# Patient Record
Sex: Male | Born: 1940 | Race: White | Hispanic: No | Marital: Married | Smoking: Current every day smoker
Health system: Southern US, Community
[De-identification: ages and names within clinical notes are randomized; demographics above are authoritative.]

## PROBLEM LIST (undated history)

## (undated) DIAGNOSIS — F32A Depression, unspecified: Secondary | ICD-10-CM

## (undated) DIAGNOSIS — F419 Anxiety disorder, unspecified: Secondary | ICD-10-CM

## (undated) DIAGNOSIS — K259 Gastric ulcer, unspecified as acute or chronic, without hemorrhage or perforation: Secondary | ICD-10-CM

## (undated) DIAGNOSIS — K635 Polyp of colon: Secondary | ICD-10-CM

## (undated) DIAGNOSIS — M1990 Unspecified osteoarthritis, unspecified site: Secondary | ICD-10-CM

## (undated) DIAGNOSIS — F0390 Unspecified dementia without behavioral disturbance: Secondary | ICD-10-CM

## (undated) DIAGNOSIS — N400 Enlarged prostate without lower urinary tract symptoms: Secondary | ICD-10-CM

## (undated) DIAGNOSIS — K219 Gastro-esophageal reflux disease without esophagitis: Secondary | ICD-10-CM

## (undated) DIAGNOSIS — M792 Neuralgia and neuritis, unspecified: Secondary | ICD-10-CM

## (undated) DIAGNOSIS — E7800 Pure hypercholesterolemia, unspecified: Secondary | ICD-10-CM

## (undated) DIAGNOSIS — G8929 Other chronic pain: Secondary | ICD-10-CM

## (undated) DIAGNOSIS — F1021 Alcohol dependence, in remission: Secondary | ICD-10-CM

## (undated) DIAGNOSIS — G250 Essential tremor: Secondary | ICD-10-CM

## (undated) DIAGNOSIS — Z972 Presence of dental prosthetic device (complete) (partial): Secondary | ICD-10-CM

## (undated) DIAGNOSIS — F329 Major depressive disorder, single episode, unspecified: Secondary | ICD-10-CM

## (undated) DIAGNOSIS — Z8719 Personal history of other diseases of the digestive system: Secondary | ICD-10-CM

## (undated) DIAGNOSIS — M1711 Unilateral primary osteoarthritis, right knee: Secondary | ICD-10-CM

## (undated) DIAGNOSIS — E0590 Thyrotoxicosis, unspecified without thyrotoxic crisis or storm: Secondary | ICD-10-CM

## (undated) DIAGNOSIS — R519 Headache, unspecified: Secondary | ICD-10-CM

## (undated) DIAGNOSIS — E119 Type 2 diabetes mellitus without complications: Secondary | ICD-10-CM

## (undated) DIAGNOSIS — I10 Essential (primary) hypertension: Secondary | ICD-10-CM

## (undated) DIAGNOSIS — T8859XA Other complications of anesthesia, initial encounter: Secondary | ICD-10-CM

## (undated) DIAGNOSIS — M5442 Lumbago with sciatica, left side: Secondary | ICD-10-CM

## (undated) DIAGNOSIS — R51 Headache: Secondary | ICD-10-CM

## (undated) HISTORY — PX: TONSILLECTOMY: SUR1361

## (undated) HISTORY — DX: Headache, unspecified: R51.9

## (undated) HISTORY — DX: Unspecified osteoarthritis, unspecified site: M19.90

## (undated) HISTORY — DX: Alcohol dependence, in remission: F10.21

## (undated) HISTORY — DX: Headache: R51

## (undated) HISTORY — DX: Polyp of colon: K63.5

## (undated) HISTORY — DX: Gastric ulcer, unspecified as acute or chronic, without hemorrhage or perforation: K25.9

## (undated) HISTORY — DX: Thyrotoxicosis, unspecified without thyrotoxic crisis or storm: E05.90

## (undated) HISTORY — PX: OTHER SURGICAL HISTORY: SHX169

## (undated) HISTORY — PX: BACK SURGERY: SHX140

---

## 2005-10-22 ENCOUNTER — Ambulatory Visit: Payer: Self-pay | Admitting: Gastroenterology

## 2005-11-07 ENCOUNTER — Other Ambulatory Visit: Payer: Self-pay

## 2005-11-12 ENCOUNTER — Ambulatory Visit: Payer: Self-pay | Admitting: Unknown Physician Specialty

## 2006-01-27 ENCOUNTER — Emergency Department: Payer: Self-pay | Admitting: Internal Medicine

## 2006-03-13 ENCOUNTER — Ambulatory Visit (HOSPITAL_BASED_OUTPATIENT_CLINIC_OR_DEPARTMENT_OTHER): Admission: RE | Admit: 2006-03-13 | Discharge: 2006-03-13 | Payer: Self-pay | Admitting: Orthopedic Surgery

## 2007-12-24 ENCOUNTER — Encounter: Payer: Self-pay | Admitting: Family Medicine

## 2008-01-20 ENCOUNTER — Encounter: Payer: Self-pay | Admitting: Family Medicine

## 2008-04-15 ENCOUNTER — Emergency Department: Payer: Self-pay | Admitting: Emergency Medicine

## 2009-05-26 ENCOUNTER — Ambulatory Visit: Payer: Self-pay | Admitting: Gastroenterology

## 2010-09-11 ENCOUNTER — Ambulatory Visit: Payer: Self-pay | Admitting: Gastroenterology

## 2010-09-13 LAB — PATHOLOGY REPORT

## 2012-05-02 ENCOUNTER — Ambulatory Visit: Payer: Self-pay | Admitting: Gastroenterology

## 2012-05-05 LAB — PATHOLOGY REPORT

## 2012-08-12 ENCOUNTER — Ambulatory Visit: Payer: Self-pay | Admitting: Neurology

## 2012-09-02 ENCOUNTER — Encounter: Payer: Self-pay | Admitting: Neurology

## 2012-09-18 ENCOUNTER — Encounter: Payer: Self-pay | Admitting: Neurology

## 2012-10-06 DIAGNOSIS — E119 Type 2 diabetes mellitus without complications: Secondary | ICD-10-CM | POA: Insufficient documentation

## 2012-10-06 DIAGNOSIS — N183 Chronic kidney disease, stage 3 unspecified: Secondary | ICD-10-CM | POA: Insufficient documentation

## 2012-10-06 DIAGNOSIS — R251 Tremor, unspecified: Secondary | ICD-10-CM | POA: Insufficient documentation

## 2012-11-03 ENCOUNTER — Encounter: Payer: Self-pay | Admitting: Neurology

## 2012-11-18 ENCOUNTER — Encounter: Payer: Self-pay | Admitting: Neurology

## 2012-12-19 ENCOUNTER — Encounter: Payer: Self-pay | Admitting: Neurology

## 2013-07-13 ENCOUNTER — Encounter: Payer: Self-pay | Admitting: Neurology

## 2013-07-19 ENCOUNTER — Encounter: Payer: Self-pay | Admitting: Neurology

## 2013-07-28 ENCOUNTER — Ambulatory Visit: Payer: Self-pay | Admitting: Orthopedic Surgery

## 2013-08-25 DIAGNOSIS — D126 Benign neoplasm of colon, unspecified: Secondary | ICD-10-CM | POA: Insufficient documentation

## 2013-08-25 DIAGNOSIS — E291 Testicular hypofunction: Secondary | ICD-10-CM | POA: Insufficient documentation

## 2013-11-12 DIAGNOSIS — K529 Noninfective gastroenteritis and colitis, unspecified: Secondary | ICD-10-CM | POA: Insufficient documentation

## 2013-11-18 DIAGNOSIS — Z720 Tobacco use: Secondary | ICD-10-CM | POA: Insufficient documentation

## 2013-11-18 DIAGNOSIS — R200 Anesthesia of skin: Secondary | ICD-10-CM | POA: Insufficient documentation

## 2013-12-01 ENCOUNTER — Ambulatory Visit: Payer: Self-pay | Admitting: Gastroenterology

## 2013-12-02 LAB — PATHOLOGY REPORT

## 2014-01-06 DIAGNOSIS — F329 Major depressive disorder, single episode, unspecified: Secondary | ICD-10-CM | POA: Insufficient documentation

## 2014-01-06 DIAGNOSIS — F32A Depression, unspecified: Secondary | ICD-10-CM | POA: Insufficient documentation

## 2014-03-19 ENCOUNTER — Ambulatory Visit: Payer: Self-pay | Admitting: Orthopedic Surgery

## 2014-04-21 ENCOUNTER — Ambulatory Visit: Payer: Self-pay | Admitting: Orthopedic Surgery

## 2014-05-21 HISTORY — PX: JOINT REPLACEMENT: SHX530

## 2014-11-03 ENCOUNTER — Encounter (HOSPITAL_COMMUNITY): Payer: Self-pay | Admitting: Physician Assistant

## 2014-11-03 DIAGNOSIS — G250 Essential tremor: Secondary | ICD-10-CM | POA: Diagnosis present

## 2014-11-03 DIAGNOSIS — F329 Major depressive disorder, single episode, unspecified: Secondary | ICD-10-CM | POA: Diagnosis present

## 2014-11-03 DIAGNOSIS — I10 Essential (primary) hypertension: Secondary | ICD-10-CM | POA: Diagnosis present

## 2014-11-03 DIAGNOSIS — M1711 Unilateral primary osteoarthritis, right knee: Secondary | ICD-10-CM | POA: Diagnosis present

## 2014-11-03 DIAGNOSIS — F331 Major depressive disorder, recurrent, moderate: Secondary | ICD-10-CM | POA: Diagnosis present

## 2014-11-03 DIAGNOSIS — F419 Anxiety disorder, unspecified: Secondary | ICD-10-CM | POA: Diagnosis present

## 2014-11-03 DIAGNOSIS — F32A Depression, unspecified: Secondary | ICD-10-CM | POA: Diagnosis present

## 2014-11-03 DIAGNOSIS — E119 Type 2 diabetes mellitus without complications: Secondary | ICD-10-CM

## 2014-11-03 NOTE — H&P (Signed)
TOTAL KNEE ADMISSION H&P  Patient is being admitted for right total knee arthroplasty.  Subjective:  Chief Complaint:right knee pain.  HPI: James Hood, 74 y.o. male, has a history of pain and functional disability in the right knee due to arthritis and has failed non-surgical conservative treatments for greater than 12 weeks to includeNSAID's and/or analgesics, corticosteriod injections, viscosupplementation injections, flexibility and strengthening excercises, supervised PT with diminished ADL's post treatment, use of assistive devices and activity modification.  Onset of symptoms was gradual, starting 10 years ago with gradually worsening course since that time. The patient noted no past surgery on the right knee(s).  Patient currently rates pain in the right knee(s) at 10 out of 10 with activity. Patient has night pain, worsening of pain with activity and weight bearing, pain that interferes with activities of daily living, crepitus and joint swelling.  Patient has evidence of subchondral sclerosis, joint subluxation and joint space narrowing by imaging studies.  There is no active infection.  Patient Active Problem List   Diagnosis Date Noted  . Hypertension   . Diabetes mellitus type 2 in nonobese   . Tremor, essential   . Primary localized osteoarthritis of right knee   . Anxiety and depression    Past Medical History  Diagnosis Date  . Hypertension   . Anxiety and depression   . Neuropathic pain   . Diabetes mellitus type 2 in nonobese   . Tremor, essential   . Hypercholesteremia   . Primary localized osteoarthritis of right knee     Past Surgical History  Procedure Laterality Date  . Esophageal stretch      No prescriptions prior to admission   No Known Allergies  History  Substance Use Topics  . Smoking status: Current Every Day Smoker -- 1.00 packs/day for 60 years    Types: Cigarettes  . Smokeless tobacco: Not on file  . 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 Heart attack Mother   . Hypertension Father      Review of Systems  Constitutional: Negative.   HENT: Negative.   Respiratory: Negative.   Cardiovascular: Negative.   Gastrointestinal: Negative.   Genitourinary: Negative.   Musculoskeletal: Positive for back pain and joint pain.  Skin: Negative.   Neurological: Negative.   Endo/Heme/Allergies: Negative.   Psychiatric/Behavioral: Negative.     Objective:  Physical Exam  Constitutional: He is oriented to person, place, and time. He appears well-developed and well-nourished.  HENT:  Head: Normocephalic and atraumatic.  Mouth/Throat: Oropharynx is clear and moist.  Eyes: Conjunctivae are normal. Pupils are equal, round, and reactive to light.  Neck: Neck supple.  Cardiovascular: Normal rate, regular rhythm and intact distal pulses.   Respiratory: Effort normal.  GI: Soft.  Genitourinary:  Not pertinent to current symptomatology therefore not examined.  Musculoskeletal:  Examination of his right knee reveals pain medially and laterally.  Moderate varus deformity.  1+ effusion.  Range of motion 0-120 degrees.  Knee is stable with normal patella tracking.  Examination of the left knee reveals full range of motion without pain, swelling, weakness or instability.  Vascular exam: Pulses are 2+ and symmetric.  Neurological: He is alert and oriented to person, place, and time.  Skin: Skin is warm and dry.  Psychiatric: He has a normal mood and affect. His behavior is normal.    Vital signs in last 24 hours:  Temp:  [98.3 F (36.8 C)] 98.3 F (36.8 C) (06/15 1600) Pulse Rate:  [69] 69 (06/15 1600) BP: (119)/(68) 119/68 mmHg (06/15 1600) SpO2:  [95 %] 95 % (06/15 1600) Weight:  [90.719 kg (200 lb)] 90.719 kg (200 lb) (06/15 1600)  Labs:   Estimated body mass index is 28.7 kg/(m^2) as calculated from the following:   Height as of this encounter: 5\' 10"   (1.778 m).   Weight as of this encounter: 90.719 kg (200 lb).   Imaging Review Plain radiographs demonstrate severe degenerative joint disease of the right knee(s). The overall alignment issignificant varus. The bone quality appears to be good for age and reported activity level.  Assessment/Plan:  End stage arthritis, right knee  Principal Problem:   Primary localized osteoarthritis of right knee Active Problems:   Hypertension   Diabetes mellitus type 2 in nonobese   Tremor, essential   Anxiety and depression   The patient history, physical examination, clinical judgment of the provider and imaging studies are consistent with end stage degenerative joint disease of the right knee(s) and total knee arthroplasty is deemed medically necessary. The treatment options including medical management, injection therapy arthroscopy and arthroplasty were discussed at length. The risks and benefits of total knee arthroplasty were presented and reviewed. The risks due to aseptic loosening, infection, stiffness, patella tracking problems, thromboembolic complications and other imponderables were discussed. The patient acknowledged the explanation, agreed to proceed with the plan and consent was signed. Patient is being admitted for inpatient treatment for surgery, pain control, PT, OT, prophylactic antibiotics, VTE prophylaxis, progressive ambulation and ADL's and discharge planning. The patient is planning to be discharged home with home health services

## 2014-11-05 ENCOUNTER — Encounter (HOSPITAL_COMMUNITY)
Admission: RE | Admit: 2014-11-05 | Discharge: 2014-11-05 | Disposition: A | Discharge status: Home / Self Care | Payer: Commercial Managed Care - HMO | Source: Ambulatory Visit | Attending: Orthopedic Surgery | Admitting: Orthopedic Surgery

## 2014-11-05 ENCOUNTER — Encounter (HOSPITAL_COMMUNITY): Payer: Self-pay

## 2014-11-05 DIAGNOSIS — M179 Osteoarthritis of knee, unspecified: Secondary | ICD-10-CM | POA: Insufficient documentation

## 2014-11-05 DIAGNOSIS — Z01812 Encounter for preprocedural laboratory examination: Secondary | ICD-10-CM | POA: Insufficient documentation

## 2014-11-05 DIAGNOSIS — Z01810 Encounter for preprocedural cardiovascular examination: Secondary | ICD-10-CM | POA: Diagnosis not present

## 2014-11-05 DIAGNOSIS — Z0183 Encounter for blood typing: Secondary | ICD-10-CM | POA: Insufficient documentation

## 2014-11-05 HISTORY — DX: Depression, unspecified: F32.A

## 2014-11-05 HISTORY — DX: Anxiety disorder, unspecified: F41.9

## 2014-11-05 HISTORY — DX: Gastro-esophageal reflux disease without esophagitis: K21.9

## 2014-11-05 HISTORY — DX: Major depressive disorder, single episode, unspecified: F32.9

## 2014-11-05 LAB — APTT: aPTT: 34 seconds (ref 24–37)

## 2014-11-05 LAB — CBC WITH DIFFERENTIAL/PLATELET
Basophils Absolute: 0 10*3/uL (ref 0.0–0.1)
Basophils Relative: 1 % (ref 0–1)
Eosinophils Absolute: 0.2 10*3/uL (ref 0.0–0.7)
Eosinophils Relative: 2 % (ref 0–5)
HCT: 35.5 % — ABNORMAL LOW (ref 39.0–52.0)
Hemoglobin: 11.9 g/dL — ABNORMAL LOW (ref 13.0–17.0)
Lymphocytes Relative: 32 % (ref 12–46)
Lymphs Abs: 2.5 10*3/uL (ref 0.7–4.0)
MCH: 31.2 pg (ref 26.0–34.0)
MCHC: 33.5 g/dL (ref 30.0–36.0)
MCV: 93.2 fL (ref 78.0–100.0)
Monocytes Absolute: 0.7 10*3/uL (ref 0.1–1.0)
Monocytes Relative: 9 % (ref 3–12)
Neutro Abs: 4.4 10*3/uL (ref 1.7–7.7)
Neutrophils Relative %: 56 % (ref 43–77)
Platelets: 305 10*3/uL (ref 150–400)
RBC: 3.81 MIL/uL — ABNORMAL LOW (ref 4.22–5.81)
RDW: 12.6 % (ref 11.5–15.5)
WBC: 7.8 10*3/uL (ref 4.0–10.5)

## 2014-11-05 LAB — COMPREHENSIVE METABOLIC PANEL
ALT: 24 U/L (ref 17–63)
AST: 34 U/L (ref 15–41)
Albumin: 4.1 g/dL (ref 3.5–5.0)
Alkaline Phosphatase: 40 U/L (ref 38–126)
Anion gap: 10 (ref 5–15)
BUN: 22 mg/dL — ABNORMAL HIGH (ref 6–20)
CO2: 28 mmol/L (ref 22–32)
Calcium: 9.8 mg/dL (ref 8.9–10.3)
Chloride: 95 mmol/L — ABNORMAL LOW (ref 101–111)
Creatinine, Ser: 1.71 mg/dL — ABNORMAL HIGH (ref 0.61–1.24)
GFR calc Af Amer: 44 mL/min — ABNORMAL LOW (ref >60)
GFR calc non Af Amer: 38 mL/min — ABNORMAL LOW (ref >60)
Glucose, Bld: 151 mg/dL — ABNORMAL HIGH (ref 65–99)
Potassium: 4.5 mmol/L (ref 3.5–5.1)
Sodium: 133 mmol/L — ABNORMAL LOW (ref 135–145)
Total Bilirubin: 0.4 mg/dL (ref 0.3–1.2)
Total Protein: 6.7 g/dL (ref 6.5–8.1)

## 2014-11-05 LAB — TYPE AND SCREEN
ABO/RH(D): O POS
Antibody Screen: NEGATIVE

## 2014-11-05 LAB — PROTIME-INR
INR: 1.08 (ref 0.00–1.49)
Prothrombin Time: 14.2 seconds (ref 11.6–15.2)

## 2014-11-05 LAB — SURGICAL PCR SCREEN
MRSA, PCR: NEGATIVE
Staphylococcus aureus: NEGATIVE

## 2014-11-05 LAB — ABO/RH: ABO/RH(D): O POS

## 2014-11-05 LAB — GLUCOSE, CAPILLARY: Glucose-Capillary: 168 mg/dL — ABNORMAL HIGH (ref 65–99)

## 2014-11-05 NOTE — Pre-Procedure Instructions (Signed)
    James Hood  11/05/2014      [facility] [place], [place] - [address] [place] [address] Phone: [phone] Fax: [phone]    Your procedure is scheduled on 11/15/14.  Report to [facility]mitting at 845 A.M.  Call this number if you have problems the morning of surgery:  [phone]   Remember:  Do not eat food or drink liquids after midnight.  Take these medicines the morning of surgery with A SIP OF WATER tylenol,zyban,flexeril,depakote,aricept,neurontin,namenda,lopressor,prilosec   Do not wear jewelry, make-up or nail polish.  Do not wear lotions, powders, or perfumes.  You may wear deodorant.  Do not shave 48 hours prior to surgery.  Men may shave face and neck.  Do not bring valuables to the hospital.  [facility] is not responsible for any belongings or valuables.  Contacts, dentures or bridgework may not be worn into surgery.  Leave your suitcase in the car.  After surgery it may be brought to your room.  For patients admitted to the hospital, discharge time will be determined by your treatment team.  Patients discharged the day of surgery will not be allowed to drive home.   Name and phone number of your driver:Special instructions:Please read over the following fact sheets that you were given. Pain Booklet, Coughing and Deep Breathing, Total Joint Packet and MRSA Information

## 2014-11-06 LAB — URINE CULTURE

## 2014-11-06 LAB — HEMOGLOBIN A1C
Hgb A1c MFr Bld: 7.4 % — ABNORMAL HIGH (ref 4.8–5.6)
Mean Plasma Glucose: 166 mg/dL

## 2014-11-15 ENCOUNTER — Inpatient Hospital Stay (HOSPITAL_COMMUNITY): Payer: Commercial Managed Care - HMO | Admitting: Anesthesiology

## 2014-11-15 ENCOUNTER — Encounter (HOSPITAL_COMMUNITY): Payer: Self-pay | Admitting: *Deleted

## 2014-11-15 ENCOUNTER — Inpatient Hospital Stay (HOSPITAL_COMMUNITY)
Admission: RE | Admit: 2014-11-15 | Discharge: 2014-11-17 | DRG: 470 | Disposition: A | Discharge status: Home Health | Payer: Commercial Managed Care - HMO | Source: Ambulatory Visit | Attending: Orthopedic Surgery | Admitting: Orthopedic Surgery

## 2014-11-15 ENCOUNTER — Encounter (HOSPITAL_COMMUNITY)
Admission: RE | Disposition: A | Discharge status: Home Health | Payer: Self-pay | Source: Ambulatory Visit | Attending: Orthopedic Surgery

## 2014-11-15 DIAGNOSIS — F419 Anxiety disorder, unspecified: Secondary | ICD-10-CM | POA: Diagnosis present

## 2014-11-15 DIAGNOSIS — F17210 Nicotine dependence, cigarettes, uncomplicated: Secondary | ICD-10-CM | POA: Diagnosis present

## 2014-11-15 DIAGNOSIS — Z8249 Family history of ischemic heart disease and other diseases of the circulatory system: Secondary | ICD-10-CM

## 2014-11-15 DIAGNOSIS — I10 Essential (primary) hypertension: Secondary | ICD-10-CM | POA: Diagnosis present

## 2014-11-15 DIAGNOSIS — E780 Pure hypercholesterolemia: Secondary | ICD-10-CM | POA: Diagnosis present

## 2014-11-15 DIAGNOSIS — M1710 Unilateral primary osteoarthritis, unspecified knee: Secondary | ICD-10-CM | POA: Diagnosis present

## 2014-11-15 DIAGNOSIS — M1711 Unilateral primary osteoarthritis, right knee: Secondary | ICD-10-CM | POA: Diagnosis present

## 2014-11-15 DIAGNOSIS — G250 Essential tremor: Secondary | ICD-10-CM | POA: Diagnosis present

## 2014-11-15 DIAGNOSIS — E119 Type 2 diabetes mellitus without complications: Secondary | ICD-10-CM

## 2014-11-15 DIAGNOSIS — F32A Depression, unspecified: Secondary | ICD-10-CM | POA: Diagnosis present

## 2014-11-15 DIAGNOSIS — F329 Major depressive disorder, single episode, unspecified: Secondary | ICD-10-CM | POA: Diagnosis present

## 2014-11-15 DIAGNOSIS — M179 Osteoarthritis of knee, unspecified: Secondary | ICD-10-CM | POA: Diagnosis present

## 2014-11-15 DIAGNOSIS — F331 Major depressive disorder, recurrent, moderate: Secondary | ICD-10-CM | POA: Diagnosis present

## 2014-11-15 HISTORY — DX: Unilateral primary osteoarthritis, right knee: M17.11

## 2014-11-15 HISTORY — DX: Type 2 diabetes mellitus without complications: E11.9

## 2014-11-15 HISTORY — DX: Major depressive disorder, single episode, unspecified: F32.9

## 2014-11-15 HISTORY — DX: Essential (primary) hypertension: I10

## 2014-11-15 HISTORY — DX: Neuralgia and neuritis, unspecified: M79.2

## 2014-11-15 HISTORY — DX: Pure hypercholesterolemia, unspecified: E78.00

## 2014-11-15 HISTORY — DX: Depression, unspecified: F32.A

## 2014-11-15 HISTORY — DX: Essential tremor: G25.0

## 2014-11-15 HISTORY — DX: Anxiety disorder, unspecified: F41.9

## 2014-11-15 HISTORY — PX: TOTAL KNEE ARTHROPLASTY: SHX125

## 2014-11-15 LAB — CREATININE, SERUM
Creatinine, Ser: 1.36 mg/dL — ABNORMAL HIGH (ref 0.61–1.24)
GFR calc Af Amer: 58 mL/min — ABNORMAL LOW (ref >60)
GFR calc non Af Amer: 50 mL/min — ABNORMAL LOW (ref >60)

## 2014-11-15 LAB — CBC
HCT: 33.2 % — ABNORMAL LOW (ref 39.0–52.0)
Hemoglobin: 11.2 g/dL — ABNORMAL LOW (ref 13.0–17.0)
MCH: 31.6 pg (ref 26.0–34.0)
MCHC: 33.7 g/dL (ref 30.0–36.0)
MCV: 93.8 fL (ref 78.0–100.0)
Platelets: 312 10*3/uL (ref 150–400)
RBC: 3.54 MIL/uL — ABNORMAL LOW (ref 4.22–5.81)
RDW: 12.5 % (ref 11.5–15.5)
WBC: 9.3 10*3/uL (ref 4.0–10.5)

## 2014-11-15 LAB — GLUCOSE, CAPILLARY
Glucose-Capillary: 130 mg/dL — ABNORMAL HIGH (ref 65–99)
Glucose-Capillary: 141 mg/dL — ABNORMAL HIGH (ref 65–99)
Glucose-Capillary: 112 mg/dL — ABNORMAL HIGH (ref 65–99)
Glucose-Capillary: 179 mg/dL — ABNORMAL HIGH (ref 65–99)
Glucose-Capillary: 172 mg/dL — ABNORMAL HIGH (ref 65–99)

## 2014-11-15 SURGERY — ARTHROPLASTY, KNEE, TOTAL
Anesthesia: Monitor Anesthesia Care | Site: Knee | Laterality: Right

## 2014-11-15 MED ORDER — LISINOPRIL-HYDROCHLOROTHIAZIDE 20-25 MG PO TABS: 1.0000 | ORAL_TABLET | Freq: Two times a day (BID) | ORAL | Status: DC

## 2014-11-15 MED ORDER — PHENOL 1.4 % MT LIQD: 1.0000 | OROMUCOSAL | Status: DC | PRN

## 2014-11-15 MED ORDER — CELECOXIB 200 MG PO CAPS
200.0000 mg | ORAL_CAPSULE | Freq: Two times a day (BID) | ORAL | Status: DC
  Administered 2014-11-15 – 2014-11-16 (×2): 200 mg via ORAL
  Filled 2014-11-15 (×2): qty 1

## 2014-11-15 MED ORDER — PRIMIDONE 50 MG PO TABS
200.0000 mg | ORAL_TABLET | Freq: Every morning | ORAL | Status: DC
  Administered 2014-11-16 – 2014-11-17 (×2): 200 mg via ORAL
  Filled 2014-11-15 (×2): qty 4

## 2014-11-15 MED ORDER — HYDROMORPHONE HCL 1 MG/ML IJ SOLN
0.2500 mg | INTRAMUSCULAR | Status: DC | PRN
Start: 2014-11-15 — End: 2014-11-15
  Administered 2014-11-15 (×2): 0.5 mg via INTRAVENOUS

## 2014-11-15 MED ORDER — FENTANYL CITRATE (PF) 250 MCG/5ML IJ SOLN
INTRAMUSCULAR | Status: DC | PRN
Start: 2014-11-15 — End: 2014-11-15
  Administered 2014-11-15 (×3): 25 ug via INTRAVENOUS

## 2014-11-15 MED ORDER — SODIUM CHLORIDE 0.9 % IR SOLN
Status: DC | PRN
  Administered 2014-11-15: 3000 mL

## 2014-11-15 MED ORDER — LACTATED RINGERS IV SOLN: INTRAVENOUS | Status: DC

## 2014-11-15 MED ORDER — INSULIN ASPART 100 UNIT/ML ~~LOC~~ SOLN
0.0000 [IU] | Freq: Every day | SUBCUTANEOUS | Status: DC
  Administered 2014-11-16: 2 [IU] via SUBCUTANEOUS

## 2014-11-15 MED ORDER — LACTATED RINGERS IV SOLN
INTRAVENOUS | Status: DC | PRN
  Administered 2014-11-15 (×2): via INTRAVENOUS

## 2014-11-15 MED ORDER — OXYCODONE HCL 5 MG PO TABS: 5.0000 mg | ORAL_TABLET | Freq: Once | ORAL | Status: DC | PRN

## 2014-11-15 MED ORDER — DIPHENHYDRAMINE HCL 12.5 MG/5ML PO ELIX: 12.5000 mg | ORAL_SOLUTION | ORAL | Status: DC | PRN

## 2014-11-15 MED ORDER — ALUM & MAG HYDROXIDE-SIMETH 200-200-20 MG/5ML PO SUSP: 30.0000 mL | ORAL | Status: DC | PRN

## 2014-11-15 MED ORDER — MENTHOL 3 MG MT LOZG: 1.0000 | LOZENGE | OROMUCOSAL | Status: DC | PRN

## 2014-11-15 MED ORDER — CEFAZOLIN SODIUM-DEXTROSE 2-3 GM-% IV SOLR
2.0000 g | Freq: Four times a day (QID) | INTRAVENOUS | Status: AC
  Administered 2014-11-15 – 2014-11-16 (×2): 2 g via INTRAVENOUS
  Filled 2014-11-15 (×2): qty 50

## 2014-11-15 MED ORDER — CEFAZOLIN SODIUM-DEXTROSE 2-3 GM-% IV SOLR
INTRAVENOUS | Status: AC
  Filled 2014-11-15: qty 50

## 2014-11-15 MED ORDER — ENOXAPARIN SODIUM 30 MG/0.3ML ~~LOC~~ SOLN
30.0000 mg | SUBCUTANEOUS | Status: DC
  Administered 2014-11-16 – 2014-11-17 (×2): 30 mg via SUBCUTANEOUS
  Filled 2014-11-15 (×2): qty 0.3

## 2014-11-15 MED ORDER — INSULIN ASPART 100 UNIT/ML ~~LOC~~ SOLN
0.0000 [IU] | Freq: Three times a day (TID) | SUBCUTANEOUS | Status: DC
  Administered 2014-11-16: 4 [IU] via SUBCUTANEOUS
  Administered 2014-11-16 (×2): 7 [IU] via SUBCUTANEOUS
  Administered 2014-11-17: 11 [IU] via SUBCUTANEOUS
  Administered 2014-11-17: 3 [IU] via SUBCUTANEOUS

## 2014-11-15 MED ORDER — LISINOPRIL 20 MG PO TABS
20.0000 mg | ORAL_TABLET | Freq: Two times a day (BID) | ORAL | Status: DC
  Administered 2014-11-15 – 2014-11-17 (×4): 20 mg via ORAL
  Filled 2014-11-15: qty 2
  Filled 2014-11-15 (×3): qty 1

## 2014-11-15 MED ORDER — ONDANSETRON HCL 4 MG/2ML IJ SOLN: 4.0000 mg | Freq: Once | INTRAMUSCULAR | Status: DC | PRN

## 2014-11-15 MED ORDER — FENTANYL CITRATE (PF) 100 MCG/2ML IJ SOLN
50.0000 ug | Freq: Once | INTRAMUSCULAR | Status: AC
Start: 2014-11-15 — End: 2014-11-15
  Administered 2014-11-15: 50 ug via INTRAVENOUS

## 2014-11-15 MED ORDER — ACETAMINOPHEN 325 MG PO TABS
650.0000 mg | ORAL_TABLET | Freq: Four times a day (QID) | ORAL | Status: DC | PRN
  Administered 2014-11-16 (×2): 650 mg via ORAL
  Filled 2014-11-15 (×2): qty 2

## 2014-11-15 MED ORDER — POLYETHYLENE GLYCOL 3350 17 G PO PACK
17.0000 g | PACK | Freq: Every day | ORAL | Status: DC | PRN
  Administered 2014-11-15: 17 g via ORAL

## 2014-11-15 MED ORDER — FENOFIBRATE 54 MG PO TABS
54.0000 mg | ORAL_TABLET | Freq: Every day | ORAL | Status: DC
  Administered 2014-11-16 – 2014-11-17 (×2): 54 mg via ORAL
  Filled 2014-11-15 (×3): qty 1

## 2014-11-15 MED ORDER — DIVALPROEX SODIUM ER 500 MG PO TB24
500.0000 mg | ORAL_TABLET | Freq: Every day | ORAL | Status: DC
  Administered 2014-11-16 – 2014-11-17 (×2): 500 mg via ORAL
  Filled 2014-11-15 (×3): qty 1

## 2014-11-15 MED ORDER — ROCURONIUM BROMIDE 50 MG/5ML IV SOLN
INTRAVENOUS | Status: AC
  Filled 2014-11-15: qty 1

## 2014-11-15 MED ORDER — DOCUSATE SODIUM 100 MG PO CAPS
100.0000 mg | ORAL_CAPSULE | Freq: Two times a day (BID) | ORAL | Status: DC
  Administered 2014-11-15 – 2014-11-17 (×4): 100 mg via ORAL
  Filled 2014-11-15 (×4): qty 1

## 2014-11-15 MED ORDER — FENTANYL CITRATE (PF) 250 MCG/5ML IJ SOLN
INTRAMUSCULAR | Status: AC
  Filled 2014-11-15: qty 5

## 2014-11-15 MED ORDER — LIDOCAINE HCL (CARDIAC) 20 MG/ML IV SOLN
INTRAVENOUS | Status: AC
  Filled 2014-11-15: qty 5

## 2014-11-15 MED ORDER — OXYCODONE HCL 5 MG PO TABS
5.0000 mg | ORAL_TABLET | ORAL | Status: DC | PRN
  Administered 2014-11-15: 10 mg via ORAL
  Administered 2014-11-15: 5 mg via ORAL
  Administered 2014-11-16 (×2): 10 mg via ORAL
  Filled 2014-11-15 (×4): qty 2

## 2014-11-15 MED ORDER — ONDANSETRON HCL 4 MG PO TABS: 4.0000 mg | ORAL_TABLET | Freq: Four times a day (QID) | ORAL | Status: DC | PRN

## 2014-11-15 MED ORDER — SACCHAROMYCES BOULARDII 250 MG PO CAPS
250.0000 mg | ORAL_CAPSULE | Freq: Two times a day (BID) | ORAL | Status: DC
  Administered 2014-11-15 – 2014-11-17 (×4): 250 mg via ORAL
  Filled 2014-11-15 (×5): qty 1

## 2014-11-15 MED ORDER — DEXAMETHASONE SODIUM PHOSPHATE 10 MG/ML IJ SOLN
10.0000 mg | Freq: Three times a day (TID) | INTRAMUSCULAR | Status: DC
  Administered 2014-11-15 – 2014-11-17 (×5): 10 mg via INTRAVENOUS
  Filled 2014-11-15 (×5): qty 1

## 2014-11-15 MED ORDER — MIDAZOLAM HCL 2 MG/2ML IJ SOLN
INTRAMUSCULAR | Status: AC
  Administered 2014-11-15: 1 mg
  Filled 2014-11-15: qty 2

## 2014-11-15 MED ORDER — LACTATED RINGERS IV SOLN
INTRAVENOUS | Status: DC
  Administered 2014-11-15: 09:00:00 via INTRAVENOUS

## 2014-11-15 MED ORDER — MEMANTINE HCL 5 MG PO TABS
5.0000 mg | ORAL_TABLET | Freq: Two times a day (BID) | ORAL | Status: DC
  Administered 2014-11-15 – 2014-11-17 (×4): 5 mg via ORAL
  Filled 2014-11-15 (×5): qty 1

## 2014-11-15 MED ORDER — DONEPEZIL HCL 10 MG PO TABS
10.0000 mg | ORAL_TABLET | Freq: Every day | ORAL | Status: DC
  Administered 2014-11-15 – 2014-11-16 (×2): 10 mg via ORAL
  Filled 2014-11-15 (×2): qty 1

## 2014-11-15 MED ORDER — GABAPENTIN 300 MG PO CAPS
600.0000 mg | ORAL_CAPSULE | Freq: Two times a day (BID) | ORAL | Status: DC
  Administered 2014-11-15 – 2014-11-17 (×4): 600 mg via ORAL
  Filled 2014-11-15 (×4): qty 2

## 2014-11-15 MED ORDER — OXYCODONE HCL 5 MG/5ML PO SOLN: 5.0000 mg | Freq: Once | ORAL | Status: DC | PRN

## 2014-11-15 MED ORDER — POTASSIUM CHLORIDE IN NACL 20-0.9 MEQ/L-% IV SOLN
INTRAVENOUS | Status: DC
  Administered 2014-11-15 – 2014-11-16 (×2): via INTRAVENOUS
  Filled 2014-11-15 (×3): qty 1000

## 2014-11-15 MED ORDER — MIDAZOLAM HCL 5 MG/5ML IJ SOLN
INTRAMUSCULAR | Status: DC | PRN
  Administered 2014-11-15 (×2): 1 mg via INTRAVENOUS

## 2014-11-15 MED ORDER — BUPIVACAINE-EPINEPHRINE (PF) 0.25% -1:200000 IJ SOLN
INTRAMUSCULAR | Status: AC
  Filled 2014-11-15: qty 30

## 2014-11-15 MED ORDER — OMEPRAZOLE MAGNESIUM 20 MG PO TBEC: 20.0000 mg | DELAYED_RELEASE_TABLET | Freq: Every day | ORAL | Status: DC

## 2014-11-15 MED ORDER — BUPIVACAINE-EPINEPHRINE 0.25% -1:200000 IJ SOLN
INTRAMUSCULAR | Status: DC | PRN
  Administered 2014-11-15: 30 mL

## 2014-11-15 MED ORDER — METOCLOPRAMIDE HCL 5 MG/ML IJ SOLN: 5.0000 mg | Freq: Three times a day (TID) | INTRAMUSCULAR | Status: DC | PRN

## 2014-11-15 MED ORDER — METOPROLOL TARTRATE 25 MG PO TABS
25.0000 mg | ORAL_TABLET | Freq: Two times a day (BID) | ORAL | Status: DC
  Administered 2014-11-15 – 2014-11-16 (×3): 25 mg via ORAL
  Filled 2014-11-15 (×4): qty 1

## 2014-11-15 MED ORDER — ONDANSETRON HCL 4 MG/2ML IJ SOLN
INTRAMUSCULAR | Status: DC | PRN
  Administered 2014-11-15: 4 mg via INTRAVENOUS

## 2014-11-15 MED ORDER — HYDROMORPHONE HCL 1 MG/ML IJ SOLN
0.5000 mg | INTRAMUSCULAR | Status: DC | PRN
  Administered 2014-11-15: 0.5 mg via INTRAVENOUS
  Administered 2014-11-16: 1 mg via INTRAVENOUS
  Administered 2014-11-16: 0.5 mg via INTRAVENOUS
  Filled 2014-11-15 (×4): qty 1

## 2014-11-15 MED ORDER — HYDROMORPHONE HCL 1 MG/ML IJ SOLN
INTRAMUSCULAR | Status: AC
  Filled 2014-11-15: qty 1

## 2014-11-15 MED ORDER — ONDANSETRON HCL 4 MG/2ML IJ SOLN
INTRAMUSCULAR | Status: AC
  Filled 2014-11-15: qty 4

## 2014-11-15 MED ORDER — MIDAZOLAM HCL 2 MG/2ML IJ SOLN: 1.0000 mg | Freq: Once | INTRAMUSCULAR | Status: DC

## 2014-11-15 MED ORDER — LORATADINE 10 MG PO TABS
10.0000 mg | ORAL_TABLET | Freq: Every day | ORAL | Status: DC
  Administered 2014-11-16 – 2014-11-17 (×2): 10 mg via ORAL
  Filled 2014-11-15 (×2): qty 1

## 2014-11-15 MED ORDER — EPHEDRINE SULFATE 50 MG/ML IJ SOLN
INTRAMUSCULAR | Status: DC | PRN
  Administered 2014-11-15 (×2): 5 mg via INTRAVENOUS

## 2014-11-15 MED ORDER — CEFAZOLIN SODIUM-DEXTROSE 2-3 GM-% IV SOLR
2.0000 g | INTRAVENOUS | Status: AC
  Administered 2014-11-15: 2 g via INTRAVENOUS
  Filled 2014-11-15: qty 50

## 2014-11-15 MED ORDER — ONDANSETRON HCL 4 MG/2ML IJ SOLN: 4.0000 mg | Freq: Four times a day (QID) | INTRAMUSCULAR | Status: DC | PRN

## 2014-11-15 MED ORDER — POVIDONE-IODINE 7.5 % EX SOLN
Freq: Once | CUTANEOUS | Status: DC
  Filled 2014-11-15: qty 118

## 2014-11-15 MED ORDER — TRAZODONE HCL 50 MG PO TABS
150.0000 mg | ORAL_TABLET | Freq: Every day | ORAL | Status: DC
  Administered 2014-11-15 – 2014-11-16 (×2): 150 mg via ORAL
  Filled 2014-11-15 (×4): qty 1

## 2014-11-15 MED ORDER — METOCLOPRAMIDE HCL 5 MG PO TABS: 5.0000 mg | ORAL_TABLET | Freq: Three times a day (TID) | ORAL | Status: DC | PRN

## 2014-11-15 MED ORDER — SODIUM CHLORIDE 0.9 % IR SOLN
Status: DC | PRN
  Administered 2014-11-15: 1000 mL

## 2014-11-15 MED ORDER — INSULIN DETEMIR 100 UNIT/ML ~~LOC~~ SOLN
15.0000 [IU] | Freq: Every day | SUBCUTANEOUS | Status: DC
  Administered 2014-11-15 – 2014-11-16 (×2): 15 [IU] via SUBCUTANEOUS
  Filled 2014-11-15 (×5): qty 0.15

## 2014-11-15 MED ORDER — MIDAZOLAM HCL 2 MG/2ML IJ SOLN
INTRAMUSCULAR | Status: AC
  Filled 2014-11-15: qty 2

## 2014-11-15 MED ORDER — EPHEDRINE SULFATE 50 MG/ML IJ SOLN
INTRAMUSCULAR | Status: AC
  Filled 2014-11-15: qty 1

## 2014-11-15 MED ORDER — HYDROCHLOROTHIAZIDE 25 MG PO TABS
25.0000 mg | ORAL_TABLET | Freq: Two times a day (BID) | ORAL | Status: DC
  Administered 2014-11-15 – 2014-11-17 (×4): 25 mg via ORAL
  Filled 2014-11-15 (×4): qty 1

## 2014-11-15 MED ORDER — PRIMIDONE 50 MG PO TABS
150.0000 mg | ORAL_TABLET | Freq: Every day | ORAL | Status: DC
  Administered 2014-11-15 – 2014-11-16 (×2): 150 mg via ORAL
  Filled 2014-11-15 (×4): qty 3

## 2014-11-15 MED ORDER — ACETAMINOPHEN 650 MG RE SUPP: 650.0000 mg | Freq: Four times a day (QID) | RECTAL | Status: DC | PRN

## 2014-11-15 MED ORDER — PHENYLEPHRINE 40 MCG/ML (10ML) SYRINGE FOR IV PUSH (FOR BLOOD PRESSURE SUPPORT)
PREFILLED_SYRINGE | INTRAVENOUS | Status: AC
  Filled 2014-11-15: qty 10

## 2014-11-15 MED ORDER — SUCCINYLCHOLINE CHLORIDE 20 MG/ML IJ SOLN
INTRAMUSCULAR | Status: AC
  Filled 2014-11-15: qty 1

## 2014-11-15 MED ORDER — DIVALPROEX SODIUM ER 250 MG PO TB24: 250.0000 mg | ORAL_TABLET | ORAL | Status: DC

## 2014-11-15 MED ORDER — HYDROMORPHONE HCL 1 MG/ML IJ SOLN: 0.5000 mg | INTRAMUSCULAR | Status: DC | PRN

## 2014-11-15 MED ORDER — DIVALPROEX SODIUM ER 250 MG PO TB24
250.0000 mg | ORAL_TABLET | Freq: Every day | ORAL | Status: DC
  Administered 2014-11-15 – 2014-11-16 (×2): 250 mg via ORAL
  Filled 2014-11-15 (×3): qty 1

## 2014-11-15 MED ORDER — DEXAMETHASONE SODIUM PHOSPHATE 10 MG/ML IJ SOLN
INTRAMUSCULAR | Status: DC | PRN
  Administered 2014-11-15: 10 mg via INTRAVENOUS

## 2014-11-15 MED ORDER — CHLORHEXIDINE GLUCONATE 4 % EX LIQD
60.0000 mL | Freq: Once | CUTANEOUS | Status: DC
Start: 2014-11-15 — End: 2014-11-15

## 2014-11-15 MED ORDER — HYDROMORPHONE HCL 1 MG/ML IJ SOLN: 0.2500 mg | INTRAMUSCULAR | Status: DC | PRN

## 2014-11-15 MED ORDER — FENTANYL CITRATE (PF) 100 MCG/2ML IJ SOLN
INTRAMUSCULAR | Status: AC
  Filled 2014-11-15: qty 2

## 2014-11-15 MED ORDER — PRIMIDONE 50 MG PO TABS: 150.0000 mg | ORAL_TABLET | ORAL | Status: DC

## 2014-11-15 MED ORDER — BUPROPION HCL ER (SMOKING DET) 150 MG PO TB12
150.0000 mg | ORAL_TABLET | Freq: Two times a day (BID) | ORAL | Status: DC
  Administered 2014-11-15 – 2014-11-17 (×4): 150 mg via ORAL
  Filled 2014-11-15 (×8): qty 1

## 2014-11-15 MED ORDER — PHENYLEPHRINE HCL 10 MG/ML IJ SOLN
INTRAMUSCULAR | Status: DC | PRN
  Administered 2014-11-15 (×4): 40 ug via INTRAVENOUS

## 2014-11-15 MED ORDER — DEXAMETHASONE SODIUM PHOSPHATE 10 MG/ML IJ SOLN
INTRAMUSCULAR | Status: AC
  Filled 2014-11-15: qty 3

## 2014-11-15 MED ORDER — SIMVASTATIN 20 MG PO TABS
20.0000 mg | ORAL_TABLET | Freq: Every day | ORAL | Status: DC
  Administered 2014-11-15 – 2014-11-16 (×2): 20 mg via ORAL
  Filled 2014-11-15: qty 1

## 2014-11-15 MED ORDER — MEPERIDINE HCL 25 MG/ML IJ SOLN: 6.2500 mg | INTRAMUSCULAR | Status: DC | PRN

## 2014-11-15 MED ORDER — PROPOFOL INFUSION 10 MG/ML OPTIME
INTRAVENOUS | Status: DC | PRN
  Administered 2014-11-15: 50 ug/kg/min via INTRAVENOUS

## 2014-11-15 MED ORDER — CYCLOBENZAPRINE HCL 10 MG PO TABS
10.0000 mg | ORAL_TABLET | Freq: Three times a day (TID) | ORAL | Status: DC | PRN
  Administered 2014-11-15 – 2014-11-17 (×3): 10 mg via ORAL
  Filled 2014-11-15 (×3): qty 1

## 2014-11-15 MED ORDER — PANTOPRAZOLE SODIUM 40 MG PO TBEC
40.0000 mg | DELAYED_RELEASE_TABLET | Freq: Every day | ORAL | Status: DC
  Administered 2014-11-16 – 2014-11-17 (×2): 40 mg via ORAL
  Filled 2014-11-15 (×2): qty 1

## 2014-11-15 MED ORDER — INSULIN ASPART 100 UNIT/ML ~~LOC~~ SOLN
4.0000 [IU] | Freq: Three times a day (TID) | SUBCUTANEOUS | Status: DC
  Administered 2014-11-16 – 2014-11-17 (×4): 4 [IU] via SUBCUTANEOUS

## 2014-11-15 SURGICAL SUPPLY — 75 items
APL SKNCLS STERI-STRIP NONHPOA (GAUZE/BANDAGES/DRESSINGS) ×1
BANDAGE ELASTIC 6 VELCRO ST LF (GAUZE/BANDAGES/DRESSINGS) ×2 IMPLANT
BANDAGE ESMARK 6X9 LF (GAUZE/BANDAGES/DRESSINGS) ×1 IMPLANT
BENZOIN TINCTURE PRP APPL 2/3 (GAUZE/BANDAGES/DRESSINGS) ×2 IMPLANT
BLADE SAGITTAL 25.0X1.19X90 (BLADE) ×4 IMPLANT
BLADE SAW SGTL 11.0X1.19X90.0M (BLADE) IMPLANT
BLADE SAW SGTL 13.0X1.19X90.0M (BLADE) ×4 IMPLANT
BLADE SURG 10 STRL SS (BLADE) ×4 IMPLANT
BNDG CMPR 9X6 STRL LF SNTH (GAUZE/BANDAGES/DRESSINGS) ×1
BNDG CMPR MED 15X6 ELC VLCR LF (GAUZE/BANDAGES/DRESSINGS) ×1
BNDG ELASTIC 6X15 VLCR STRL LF (GAUZE/BANDAGES/DRESSINGS) ×2 IMPLANT
BNDG ESMARK 6X9 LF (GAUZE/BANDAGES/DRESSINGS) ×2
BOWL SMART MIX CTS (DISPOSABLE) ×2 IMPLANT
CAPT KNEE TOTAL 3 ATTUNE ×2 IMPLANT
CEMENT HV SMART SET (Cement) ×4 IMPLANT
CLSR STERI-STRIP ANTIMIC 1/2X4 (GAUZE/BANDAGES/DRESSINGS) ×2 IMPLANT
COVER SURGICAL LIGHT HANDLE (MISCELLANEOUS) ×2 IMPLANT
CUFF TOURNIQUET SINGLE 34IN LL (TOURNIQUET CUFF) ×2 IMPLANT
CUFF TOURNIQUET SINGLE 44IN (TOURNIQUET CUFF) IMPLANT
DRAPE EXTREMITY T 121X128X90 (DRAPE) ×2 IMPLANT
DRAPE IMP U-DRAPE 54X76 (DRAPES) ×2 IMPLANT
DRAPE INCISE IOBAN 66X45 STRL (DRAPES) ×2 IMPLANT
DRAPE PROXIMA HALF (DRAPES) ×2 IMPLANT
DRAPE U-SHAPE 47X51 STRL (DRAPES) ×2 IMPLANT
DRSG AQUACEL AG ADV 3.5X14 (GAUZE/BANDAGES/DRESSINGS) ×2 IMPLANT
DRSG PAD ABDOMINAL 8X10 ST (GAUZE/BANDAGES/DRESSINGS) ×4 IMPLANT
DURAPREP 26ML APPLICATOR (WOUND CARE) ×4 IMPLANT
ELECT CAUTERY BLADE 6.4 (BLADE) ×4 IMPLANT
ELECT PENCIL ROCKER SW 15FT (MISCELLANEOUS) ×2 IMPLANT
ELECT REM PT RETURN 9FT ADLT (ELECTROSURGICAL) ×2
ELECTRODE REM PT RTRN 9FT ADLT (ELECTROSURGICAL) ×1 IMPLANT
EVACUATOR 1/8 PVC DRAIN (DRAIN) ×2 IMPLANT
FACESHIELD WRAPAROUND (MASK) ×2 IMPLANT
GAUZE SPONGE 4X4 12PLY STRL (GAUZE/BANDAGES/DRESSINGS) ×2 IMPLANT
GLOVE BIO SURGEON STRL SZ7 (GLOVE) ×4 IMPLANT
GLOVE BIOGEL PI IND STRL 6.5 (GLOVE) ×2 IMPLANT
GLOVE BIOGEL PI IND STRL 7.0 (GLOVE) ×1 IMPLANT
GLOVE BIOGEL PI IND STRL 7.5 (GLOVE) ×1 IMPLANT
GLOVE BIOGEL PI INDICATOR 6.5 (GLOVE) ×2
GLOVE BIOGEL PI INDICATOR 7.0 (GLOVE) ×1
GLOVE BIOGEL PI INDICATOR 7.5 (GLOVE) ×1
GLOVE SS BIOGEL STRL SZ 7.5 (GLOVE) ×1 IMPLANT
GLOVE SUPERSENSE BIOGEL SZ 7.5 (GLOVE) ×1
GOWN STRL REUS W/ TWL LRG LVL3 (GOWN DISPOSABLE) ×2 IMPLANT
GOWN STRL REUS W/ TWL XL LVL3 (GOWN DISPOSABLE) ×2 IMPLANT
GOWN STRL REUS W/TWL LRG LVL3 (GOWN DISPOSABLE) ×4
GOWN STRL REUS W/TWL XL LVL3 (GOWN DISPOSABLE) ×4
HANDPIECE INTERPULSE COAX TIP (DISPOSABLE) ×2
HOOD PEEL AWAY FACE SHEILD DIS (HOOD) ×4 IMPLANT
IMMOBILIZER KNEE 22 UNIV (SOFTGOODS) ×2 IMPLANT
KIT BASIN OR (CUSTOM PROCEDURE TRAY) ×2 IMPLANT
KIT ROOM TURNOVER OR (KITS) ×2 IMPLANT
MANIFOLD NEPTUNE II (INSTRUMENTS) ×2 IMPLANT
MARKER SKIN DUAL TIP RULER LAB (MISCELLANEOUS) ×2 IMPLANT
NS IRRIG 1000ML POUR BTL (IV SOLUTION) ×2 IMPLANT
PACK TOTAL JOINT (CUSTOM PROCEDURE TRAY) ×2 IMPLANT
PACK UNIVERSAL I (CUSTOM PROCEDURE TRAY) ×2 IMPLANT
PAD ARMBOARD 7.5X6 YLW CONV (MISCELLANEOUS) ×4 IMPLANT
PADDING CAST COTTON 6X4 STRL (CAST SUPPLIES) ×2 IMPLANT
RUBBERBAND STERILE (MISCELLANEOUS) ×2 IMPLANT
SET HNDPC FAN SPRY TIP SCT (DISPOSABLE) ×1 IMPLANT
SPONGE GAUZE 4X4 12PLY STER LF (GAUZE/BANDAGES/DRESSINGS) ×2 IMPLANT
STRIP CLOSURE SKIN 1/2X4 (GAUZE/BANDAGES/DRESSINGS) ×2 IMPLANT
SUCTION FRAZIER TIP 10 FR DISP (SUCTIONS) ×2 IMPLANT
SUT ETHIBOND NAB CT1 #1 30IN (SUTURE) ×4 IMPLANT
SUT MNCRL AB 3-0 PS2 18 (SUTURE) ×2 IMPLANT
SUT VIC AB 0 CT1 27 (SUTURE) ×4
SUT VIC AB 0 CT1 27XBRD ANBCTR (SUTURE) ×2 IMPLANT
SUT VIC AB 2-0 CT1 27 (SUTURE) ×4
SUT VIC AB 2-0 CT1 TAPERPNT 27 (SUTURE) ×2 IMPLANT
SYR 30ML SLIP (SYRINGE) ×2 IMPLANT
TOWEL OR 17X24 6PK STRL BLUE (TOWEL DISPOSABLE) ×2 IMPLANT
TOWEL OR 17X26 10 PK STRL BLUE (TOWEL DISPOSABLE) ×2 IMPLANT
TRAY FOLEY CATH 16FR SILVER (SET/KITS/TRAYS/PACK) ×2 IMPLANT
WATER STERILE IRR 1000ML POUR (IV SOLUTION) ×4 IMPLANT

## 2014-11-15 NOTE — Anesthesia Preprocedure Evaluation (Signed)
Anesthesia Evaluation  Patient identified by MRN, date of birth, ID band Patient awake    Reviewed: Allergy & Precautions, NPO status , Patient's Chart, lab work & pertinent test results  Airway Mallampati: II  TM Distance: >3 FB Neck ROM: Full    Dental  (+) Teeth Intact, Dental Advisory Given   Pulmonary Current Smoker,  breath sounds clear to auscultation        Cardiovascular hypertension, Rhythm:Regular Rate:Normal     Neuro/Psych    GI/Hepatic   Endo/Other  diabetes  Renal/GU      Musculoskeletal   Abdominal   Peds  Hematology   Anesthesia Other Findings   Reproductive/Obstetrics                             Anesthesia Physical Anesthesia Plan  ASA: III  Anesthesia Plan: MAC and Spinal   Post-op Pain Management: MAC Combined w/ Regional for Post-op pain   Induction: Intravenous  Airway Management Planned: Natural Airway and Simple Face Mask  Additional Equipment:   Intra-op Plan:   Post-operative Plan:   Informed Consent: I have reviewed the patients History and Physical, chart, labs and discussed the procedure including the risks, benefits and alternatives for the proposed anesthesia with the patient or authorized representative who has indicated his/her understanding and acceptance.   Dental advisory given  Plan Discussed with: CRNA and Anesthesiologist  Anesthesia Plan Comments: (DJD R. Knee Type 2 DM glucose 130 Renal insuff Cr 1.71 Hypertension  Roberts Gaudy)        Anesthesia Quick Evaluation

## 2014-11-15 NOTE — Progress Notes (Signed)
Utilization review completed.  

## 2014-11-15 NOTE — Op Note (Signed)
MRN:     [id number] DOB/AGE:    01-24-1941 / 74 y.o.       OPERATIVE REPORT    DATE OF PROCEDURE:  11/15/2014       PREOPERATIVE DIAGNOSIS:   Primary localized OA right knee      Estimated body mass index is 31 kg/(m^2) as calculated from the following:   Height as of this encounter: 5\' 7"  (1.702 m).   Weight as of this encounter: 89.812 kg (198 lb).                                                        POSTOPERATIVE DIAGNOSIS:   same                                                                     PROCEDURE:  Procedure(s): TOTAL KNEE ARTHROPLASTY Using Depuy Attune RP implants #6 Femur, #7Tibia, 67mm attune RP bearing, 35 Patella     SURGEON: Taleya Whitcher,Gennaro A    ASSISTANT:  Kirstin Shepperson PA-C   (Present and scrubbed throughout the case, critical for assistance with exposure, retraction, instrumentation, and closure.)         ANESTHESIA: GET with Femoral Nerve Block  DRAINS: foley, 2 medium hemovac in knee   TOURNIQUET TIME: 62VOJ   COMPLICATIONS:  None     SPECIMENS: None   INDICATIONS FOR PROCEDURE: The patient has  djd right knee, varus deformities, XR shows bone on bone arthritis. Patient has failed all conservative measures including anti-inflammatory medicines, narcotics, attempts at  exercise and weight loss, cortisone injections and viscosupplementation.  Risks and benefits of surgery have been discussed, questions answered.   DESCRIPTION OF PROCEDURE: The patient identified by armband, received  right femoral nerve block and IV antibiotics, in the holding area at [facility]. Patient taken to the operating room, appropriate anesthetic  monitors were attached General endotracheal anesthesia induced with  the patient in supine position, Foley catheter was inserted. Tourniquet  applied high to the operative thigh. Lateral post and foot positioner  applied to the table, the lower extremity was then prepped and draped  in usual sterile fashion from the ankle to  the tourniquet. Time-out procedure was performed. The limb was wrapped with an Esmarch bandage and the tourniquet inflated to 365 mmHg. We began the operation by making the anterior midline incision starting at handbreadth above the patella going over the patella 1 cm medial to and  4 cm distal to the tibial tubercle. Small bleeders in the skin and the  subcutaneous tissue identified and cauterized. Transverse retinaculum was incised and reflected medially and a medial parapatellar arthrotomy was accomplished. the patella was everted and theprepatellar fat pad resected. The superficial medial collateral  ligament was then elevated from anterior to posterior along the proximal  flare of the tibia and anterior half of the menisci resected. The knee was hyperflexed exposing bone on bone arthritis. Peripheral and notch osteophytes as well as the cruciate ligaments were then resected. We continued to  work our way around posteriorly along the proximal tibia, and externally  rotated the tibia subluxing it out from underneath the femur. A McHale  retractor was placed through the notch and a lateral Hohmann retractor  placed, and we then drilled through the proximal tibia in line with the  axis of the tibia followed by an intramedullary guide rod and 2-degree  posterior slope cutting guide. The tibial cutting guide was pinned into place  allowing resection of 4 mm of bone medially and about 6 mm of bone  laterally because of her varus deformity. Satisfied with the tibial resection, we then  entered the distal femur 2 mm anterior to the PCL origin with the  intramedullary guide rod and applied the distal femoral cutting guide  set at 78mm, with 5 degrees of valgus. This was pinned along the  epicondylar axis. At this point, the distal femoral cut was accomplished without difficulty. We then sized for a #6 femoral component and pinned the guide in 3 degrees of external rotation.The chamfer cutting guide was  pinned into place. The anterior, posterior, and chamfer cuts were accomplished without difficulty followed by  the Attune RP box cutting guide and the box cut. We also removed posterior osteophytes from the posterior femoral condyles. At this  time, the knee was brought into full extension. We checked our  extension and flexion gaps and found them symmetric at 28mm.  The patella thickness measured at 25 mm. We set the cutting guide at 15 and removed the posterior 9.5-10 mm  of the patella sized for 35 button and drilled the lollipop. The knee  was then once again hyperflexed exposing the proximal tibia. We sized for a #7 tibial base plate, applied the smokestack and the conical reamer followed by the the Delta fin keel punch. We then hammered into place the Attune RP trial femoral component, inserted a 6-mm trial bearing, trial patellar button, and took the knee through range of motion from 0-130 degrees. No thumb pressure was required for patellar  tracking. At this point, all trial components were removed, a double batch of DePuy HV cement  was mixed and applied to all bony metallic mating surfaces except for the posterior condyles of the femur itself. In order, we  hammered into place the tibial tray and removed excess cement, the femoral component and removed excess cement, a 6-mm Attune RP bearing  was inserted, and the knee brought to full extension with compression.  The patellar button was clamped into place, and excess cement  removed. While the cement cured the wound was irrigated out with normal saline solution pulse lavage, and medium Hemovac drains were placed.. Ligament stability and patellar tracking were checked and found to be excellent. The tourniquet was then released and hemostasis was obtained with cautery. The parapatellar arthrotomy was closed with  #1 ethibond suture. The subcutaneous tissue with 0 and 2-0 undyed  Vicryl suture, and 4-0 Monocryl.. A dressing of Xeroform,  4 x 4,  dressing sponges, Webril, and Ace wrap applied. Needle and sponge count were correct times 2.The patient awakened, extubated, and taken to recovery room without difficulty. Vascular status was normal, pulses 2+ and symmetric.   Arisbeth Purrington,Hameed A 11/15/2014, 1:09 PM

## 2014-11-15 NOTE — Anesthesia Postprocedure Evaluation (Signed)
  Anesthesia Post-op Note  Patient: James Hood  Procedure(s) Performed: Procedure(s): TOTAL KNEE ARTHROPLASTY (Right)  Patient Location: PACU  Anesthesia Type:MAC and Spinal  Level of Consciousness: awake, alert  and oriented  Airway and Oxygen Therapy: Patient Spontanous Breathing and Patient connected to nasal cannula oxygen  Post-op Pain: none  Post-op Assessment: Post-op Vital signs reviewed, Patient's Cardiovascular Status Stable, PATIENT'S CARDIOVASCULAR STATUS UNSTABLE, Patent Airway and Pain level controlled              Post-op Vital Signs: stable  Last Vitals:  Filed Vitals:   11/15/14 1345  BP: 108/57  Pulse: 71  Temp:   Resp: 17    Complications: No apparent anesthesia complications

## 2014-11-15 NOTE — Interval H&P Note (Signed)
History and Physical Interval Note:  11/15/2014 11:19 AM  James Hood  has presented today for surgery, with the diagnosis of primary localized OA right knee  The various methods of treatment have been discussed with the patient and family. After consideration of risks, benefits and other options for treatment, the patient has consented to  Procedure(s): TOTAL KNEE ARTHROPLASTY (Right) as a surgical intervention .  The patient's history has been reviewed, patient examined, no change in status, stable for surgery.  I have reviewed the patient's chart and labs.  Questions were answered to the patient's satisfaction.     Elsie Saas A

## 2014-11-15 NOTE — Progress Notes (Signed)
Orthopedic Tech Progress Note Patient Details:  James Hood 19-Jan-1941 [id number] CPM applied to RLE with appropriate settings. OHF applied to bed. Footsie roll provided. CPM Right Knee CPM Right Knee: On Right Knee Flexion (Degrees): 90 Right Knee Extension (Degrees): 0   James Hood 11/15/2014, 2:41 PM

## 2014-11-15 NOTE — Addendum Note (Signed)
Addendum  created 11/15/14 1424 by Lillia Abed, MD   Modules edited: Orders, PRL Based Order Sets

## 2014-11-15 NOTE — Transfer of Care (Signed)
Immediate Anesthesia Transfer of Care Note  Patient: James Hood  Procedure(s) Performed: Procedure(s): TOTAL KNEE ARTHROPLASTY (Right)  Patient Location: PACU  Anesthesia Type:Spinal  Level of Consciousness: awake, alert  and oriented  Airway & Oxygen Therapy: Patient Spontanous Breathing  Post-op Assessment: Report given to RN and Post -op Vital signs reviewed and stable  Post vital signs: Reviewed and stable  Last Vitals:  Filed Vitals:   11/15/14 1345  BP: 108/57  Pulse: 71  Temp:   Resp: 17    Complications: No apparent anesthesia complications

## 2014-11-15 NOTE — Anesthesia Procedure Notes (Addendum)
Anesthesia Regional Block:  Adductor canal block  Pre-Anesthetic Checklist: ,, timeout performed, Correct Patient, Correct Site, Correct Laterality, Correct Procedure, Correct Position, site marked, Risks and benefits discussed,  Surgical consent,  Pre-op evaluation,  At surgeon's request and post-op pain management  Laterality: Right  Prep: chloraprep       Needles:  Injection technique: Single-shot  Needle Type: Echogenic Stimulator Needle     Needle Length: 9cm 9 cm Needle Gauge: 22 and 22 G    Additional Needles:  Procedures: ultrasound guided (picture in chart) Adductor canal block Narrative:  Start time: 11/15/2014 9:40 AM End time: 11/15/2014 9:45 AM Injection made incrementally with aspirations every 5 mL.  Performed by: Personally   Additional Notes: 30 cc 0.5% Marcaine 1:200 Epi injected easily      Procedure Name: MAC Date/Time: 11/15/2014 11:34 AM Performed by: Suzy Bouchard Pre-anesthesia Checklist: Patient identified, Suction available, Emergency Drugs available, Timeout performed and Patient being monitored Patient Re-evaluated:Patient Re-evaluated prior to inductionOxygen Delivery Method: Nasal cannula    Spinal  Start time: 11/15/2014 11:32 AM End time: 11/15/2014 11:37 AM Staffing Performed by: anesthesiologist  Preanesthetic Checklist Completed: patient identified, site marked, surgical consent, pre-op evaluation, timeout performed, IV checked, risks and benefits discussed and monitors and equipment checked Spinal Block Patient position: right lateral decubitus Prep: ChloraPrep Patient monitoring: heart rate, cardiac monitor, continuous pulse ox and blood pressure Approach: right paramedian Location: L3-4 Injection technique: single-shot Needle Needle type: Tuohy  Needle gauge: 22 G Needle length: 9 cm Needle insertion depth: 5 cm Assessment Sensory level: T10 Additional Notes 10 mg 0.75% Bupivacaine injected easily

## 2014-11-16 ENCOUNTER — Encounter (HOSPITAL_COMMUNITY): Payer: Self-pay | Admitting: Orthopedic Surgery

## 2014-11-16 LAB — BASIC METABOLIC PANEL
Anion gap: 6 (ref 5–15)
BUN: 20 mg/dL (ref 6–20)
CO2: 28 mmol/L (ref 22–32)
Calcium: 8.6 mg/dL — ABNORMAL LOW (ref 8.9–10.3)
Chloride: 98 mmol/L — ABNORMAL LOW (ref 101–111)
Creatinine, Ser: 1.31 mg/dL — ABNORMAL HIGH (ref 0.61–1.24)
GFR calc Af Amer: >60 mL/min (ref >60)
GFR calc non Af Amer: 52 mL/min — ABNORMAL LOW (ref >60)
Glucose, Bld: 182 mg/dL — ABNORMAL HIGH (ref 65–99)
Potassium: 4.3 mmol/L (ref 3.5–5.1)
Sodium: 132 mmol/L — ABNORMAL LOW (ref 135–145)

## 2014-11-16 LAB — CBC
HCT: 29.5 % — ABNORMAL LOW (ref 39.0–52.0)
Hemoglobin: 10.1 g/dL — ABNORMAL LOW (ref 13.0–17.0)
MCH: 31.5 pg (ref 26.0–34.0)
MCHC: 34.2 g/dL (ref 30.0–36.0)
MCV: 91.9 fL (ref 78.0–100.0)
Platelets: 311 10*3/uL (ref 150–400)
RBC: 3.21 MIL/uL — ABNORMAL LOW (ref 4.22–5.81)
RDW: 12.7 % (ref 11.5–15.5)
WBC: 10.2 10*3/uL (ref 4.0–10.5)

## 2014-11-16 LAB — GLUCOSE, CAPILLARY
Glucose-Capillary: 231 mg/dL — ABNORMAL HIGH (ref 65–99)
Glucose-Capillary: 231 mg/dL — ABNORMAL HIGH (ref 65–99)
Glucose-Capillary: 201 mg/dL — ABNORMAL HIGH (ref 65–99)

## 2014-11-16 MED ORDER — CELECOXIB 200 MG PO CAPS
200.0000 mg | ORAL_CAPSULE | Freq: Every day | ORAL | Status: DC
Start: 2014-11-17 — End: 2014-11-17
  Administered 2014-11-17: 200 mg via ORAL
  Filled 2014-11-16: qty 1

## 2014-11-16 MED ORDER — SODIUM CHLORIDE 0.9 % IV SOLN
INTRAVENOUS | Status: DC
  Administered 2014-11-16 (×2): via INTRAVENOUS
  Filled 2014-11-16: qty 1000

## 2014-11-16 MED ORDER — SODIUM CHLORIDE 0.9 % IV BOLUS (SEPSIS)
500.0000 mL | Freq: Once | INTRAVENOUS | Status: AC
Start: 2014-11-16 — End: 2014-11-16
  Administered 2014-11-16: 500 mL via INTRAVENOUS

## 2014-11-16 MED ORDER — HYDROMORPHONE HCL 1 MG/ML IJ SOLN
1.0000 mg | INTRAMUSCULAR | Status: DC | PRN
  Administered 2014-11-16 – 2014-11-17 (×5): 1 mg via INTRAVENOUS
  Filled 2014-11-16 (×5): qty 1

## 2014-11-16 MED ORDER — OXYCODONE HCL 5 MG PO TABS
15.0000 mg | ORAL_TABLET | ORAL | Status: DC | PRN
  Administered 2014-11-16: 15 mg via ORAL
  Administered 2014-11-16: 20 mg via ORAL
  Administered 2014-11-16 (×2): 15 mg via ORAL
  Administered 2014-11-17 (×3): 20 mg via ORAL
  Filled 2014-11-16 (×2): qty 3
  Filled 2014-11-16 (×3): qty 4
  Filled 2014-11-16: qty 3
  Filled 2014-11-16: qty 4

## 2014-11-16 NOTE — Progress Notes (Signed)
Occupational Therapy Evaluation Patient Details Name: James Hood MRN: [id number] DOB: 30-Mar-1941 Today's Date: 11/16/2014    History of Present Illness Patient is a 74 y/o male s/p R TKA. PMH includes HTN, DM, depression and anxiety.   Clinical Impression   Making excellent progress. Completed all education regarding ADL and functional mobility for ADL. Wife present for education. Educated on reducing fall risk and home safety. Pt ready to D/C tomorrow with intermittent S from OT standpoint. OT signing off. thanks    Follow Up Recommendations  No OT follow up;Supervision - Intermittent    Equipment Recommendations  None recommended by OT    Recommendations for Other Services       Precautions / Restrictions Precautions Precautions: Knee Precaution Booklet Issued: Yes (comment) Precaution Comments: Reviewed no pillow under knee and zero degree knee Restrictions Weight Bearing Restrictions: Yes RLE Weight Bearing: Weight bearing as tolerated      Mobility Bed Mobility Overal bed mobility: Modified Independent Bed Mobility: Sit to Supine       Sit to supine: Modified independent (Device/Increase time)   General bed mobility comments: HOB flat, no use of rails to simulate home.   Transfers Overall transfer level: Needs assistance Equipment used: Rolling walker (2 wheeled) Transfers: Sit to/from [employer] Sit to Stand: Supervision Stand pivot transfers: Supervision       General transfer comment: Supervision for safety. Stood from [employer], from [employer].    Balance Overall balance assessment: Needs assistance Sitting-balance support: Feet supported;No upper extremity supported Sitting balance-Leahy Scale: Good     Standing balance support: During functional activity Standing balance-Leahy Scale: Fair                              ADL Overall ADL's : Needs assistance/impaired         Upper Body Bathing: Set up    Lower Body Bathing: Supervison/ safety;Set up;Sit to/from stand   Upper Body Dressing : Set up   Lower Body Dressing: Supervision/safety;Set up;Sit to/from stand   Toilet Transfer: Supervision/safety;Comfort height toilet;Ambulation;RW;Grab bars   Toileting- Clothing Manipulation and Hygiene: Supervision/safety;Sit to/from Nurse, children's Details (indicate cue type and reason): Educated pt/wife on shower transfer technique. given handout. Functional mobility during ADLs: Supervision/safety;Rolling walker;Cueing for sequencing General ADL Comments: Completed education regarding compensatory techniques for ADL and mobility @ RW level. discussed home safety and reducing risk of falls. Pt verbalized understanding.                     Pertinent Vitals/Pain Pain Assessment: 0-10 Pain Score: 4  Faces Pain Scale: Hurts little more Pain Location: R knee Pain Descriptors / Indicators: Aching;Discomfort Pain Intervention(s): Monitored during session;Repositioned;Ice applied     Hand Dominance Right   Extremity/Trunk Assessment Upper Extremity Assessment Upper Extremity Assessment: Overall WFL for tasks assessed   Lower Extremity Assessment Lower Extremity Assessment: Defer to PT evaluation   Cervical / Trunk Assessment Cervical / Trunk Assessment: Normal   Communication Communication Communication: No difficulties   Cognition Arousal/Alertness: Awake/alert Behavior During Therapy: WFL for tasks assessed/performed Overall Cognitive Status: Within Functional Limits for tasks assessed                                        Home Living Family/patient expects to be discharged to::  Private residence Living Arrangements: Spouse/significant other;Other relatives Available Help at Discharge: Family;Available 24 hours/day Type of Home: House Home Access: Stairs to enter [employer] of Steps: 3 Entrance Stairs-Rails: Left Home Layout:  Multi-level Alternate Level Stairs-Number of Steps: 7 Alternate Level Stairs-Rails: Right;Left;Can reach both Bathroom Shower/Tub: Tub/shower unit Shower/tub characteristics: Door Biochemist, clinical: Standard Bathroom Accessibility: Yes How Accessible: Accessible via walker Home Equipment: Gilford Rile - 2 wheels;Bedside commode;Cane - single point          Prior Functioning/Environment Level of Independence: Independent with assistive device(s)        Comments: Pt using SPC PTA.    OT Diagnosis: Generalized weakness;Acute pain   OT Problem List: Decreased strength;Decreased range of motion;Decreased activity tolerance;Pain;Decreased knowledge of use of DME or AE   OT Treatment/Interventions:      OT Goals(Current goals can be found in the care plan section) Acute Rehab OT Goals Patient Stated Goal: to go home tomorrow OT Goal Formulation: All assessment and education complete, DC therapy  OT Frequency:     Barriers to D/C:            Co-evaluation              End of Session Equipment Utilized During Treatment: Gait belt;Rolling walker CPM Right Knee CPM Right Knee: Off Additional Comments: zero degree knee @ 1440 Nurse Communication: Mobility status  Activity Tolerance: Patient tolerated treatment well Patient left: in bed;with call bell/phone within reach;with family/visitor present   Time: 1425-1443 OT Time Calculation (min): 18 min Charges:  OT General Charges $OT Visit: 1 Procedure OT Evaluation $Initial OT Evaluation Tier I: 1 Procedure G-Codes:    Lavana Huckeba,HILLARY 11-18-14, 2:56 PM   [facility], OTR/L  [phone] 11/18/2014

## 2014-11-16 NOTE — Progress Notes (Signed)
Orthopedic Tech Progress Note Patient Details:  James Hood 02-09-1941 [id number] Patient placed in CPM CPM Right Knee CPM Right Knee: On Right Knee Flexion (Degrees): 90 Right Knee Extension (Degrees): 0 Additional Comments: zero degree knee @ 1440   Asia R Thompson 11/16/2014, 3:31 PM

## 2014-11-16 NOTE — Progress Notes (Signed)
Orthopedic Tech Progress Note Patient Details:  James Hood [birth year] [id number] Off cpm at 7:10 pm Patient ID: James Hood, male   DOB: 10/09/1940, 74 y.o.   MRN: [id number]   James Hood 11/16/2014, 7:11 PM

## 2014-11-16 NOTE — Progress Notes (Signed)
Physical Therapy Treatment Patient Details Name: James Hood MRN: [id number] DOB: 02-15-1941 Today's Date: 11/16/2014    History of Present Illness Patient is a 74 y/o male s/p R TKA. PMH includes HTN, DM, depression and anxiety.    PT Comments    Patient progressing well towards PT goals. Performed stair training with spouse present during session. Will need to negotiate 7 steps in AM session tomorrow to ensure pt can safely get to bedroom. Improving gait mechanics and distance. Will continue to follow to maximize independence and mobility prior to return home.   Follow Up Recommendations  Home health PT;Supervision/Assistance - 24 hour     Equipment Recommendations  None recommended by PT    Recommendations for Other Services       Precautions / Restrictions Precautions Precautions: Knee Precaution Booklet Issued: Yes (comment) Precaution Comments: Reviewed no pillow under knee and zero degree knee Restrictions Weight Bearing Restrictions: Yes RLE Weight Bearing: Weight bearing as tolerated    Mobility  Bed Mobility Overal bed mobility: Modified Independent Bed Mobility: Sit to Supine       Sit to supine: Modified independent (Device/Increase time)   General bed mobility comments: HOB flat, no use of rails to simulate home.   Transfers Overall transfer level: Needs assistance Equipment used: Rolling walker (2 wheeled) Transfers: Sit to/from Stand Sit to Stand: Supervision         General transfer comment: Supervision for safety. Stood from [employer], from [employer].  Ambulation/Gait Ambulation/Gait assistance: Supervision Ambulation Distance (Feet): 150 Feet Assistive device: Rolling walker (2 wheeled) Gait Pattern/deviations: Step-through pattern;Decreased stance time - right;Decreased step length - left;Trunk flexed;Antalgic   Gait velocity interpretation: Below normal speed for age/gender General Gait Details: Cues for knee extension for quad  activation during stance phase. Emphasize heel strike.   Stairs Stairs: Yes Stairs assistance: Min guard Stair Management: Sideways;Step to pattern;Forwards;One rail Right;Two rails Number of Stairs: 2 (x2 bouts) General stair comments: Cues for technique and sequence. First time, BUEs used rail on left sideways. Second time, Bil rails forwards (steps to get to bedroom).  Wheelchair Mobility    Modified Rankin (Stroke Patients Only)       Balance Overall balance assessment: Needs assistance Sitting-balance support: Feet supported;No upper extremity supported Sitting balance-Leahy Scale: Good     Standing balance support: During functional activity Standing balance-Leahy Scale: Fair                      Cognition Arousal/Alertness: Awake/alert Behavior During Therapy: WFL for tasks assessed/performed Overall Cognitive Status: Within Functional Limits for tasks assessed                      Exercises      General Comments General comments (skin integrity, edema, etc.): Wife present during session.      Pertinent Vitals/Pain Pain Assessment: Faces Faces Pain Scale: Hurts little more Pain Location: right knee Pain Descriptors / Indicators: Sore;Aching Pain Intervention(s): Monitored during session;Repositioned;Premedicated before session    Home Living                      Prior Function            PT Goals (current goals can now be found in the care plan section) Acute Rehab PT Goals Patient Stated Goal: to go home tomorrow PT Goal Formulation: With patient Time For Goal Achievement: 11/30/14 Potential to Achieve Goals: Good Progress towards PT  goals: Progressing toward goals    Frequency  7X/week    PT Plan Current plan remains appropriate    Co-evaluation             End of Session Equipment Utilized During Treatment: Gait belt Activity Tolerance: Patient tolerated treatment well Patient left: in bed;with call  bell/phone within reach;with family/visitor present     Time: 1638-4536 PT Time Calculation (min) (ACUTE ONLY): 23 min  Charges:  $Gait Training: 23-37 mins                    G Codes:      [place] 11/16/2014, 2:30 PM Wray Kearns, [place], DPT [phone]

## 2014-11-16 NOTE — Care Management Note (Signed)
Case Management Note  Patient Details  Name: James Hood MRN: [id number] Date of Birth: Mar 07, 1941  Subjective/Objective:      S/p right total knee arthroplasty              Action/Plan: Spoke with patient and wife, they chose advanced [facility] from [facility] list of agencies. [employer] delivered CPM, rolling walker and 3N1 to home. Patient states that his wife will be available to assist after discharge.Contacted Miranda at [place] and set up [place].   Expected Discharge Date:                  Expected Discharge Plan:  [place]  In-House Referral:  NA  Discharge planning Services  CM Consult  Post Acute Care Choice:  Durable Medical Equipment, Home Health Choice offered to:  Patient  DME Arranged:  3-N-1, CPM, Walker rolling DME Agency:  [employer]  HH Arranged:  PT [place]:  [place]  Status of Service:  Completed, signed off  Medicare Important Message Given:    Date Medicare IM Given:    Medicare IM give by:    Date Additional Medicare IM Given:    Additional Medicare Important Message give by:     If discussed at [place] of Stay Meetings, dates discussed:    Additional Comments:  Nila Nephew, RN 11/16/2014, 4:16 PM

## 2014-11-16 NOTE — Evaluation (Signed)
Physical Therapy Evaluation Patient Details Name: James Hood MRN: [id number] DOB: [birth year] Today's Date: 11/16/2014   History of Present Illness  Patient is a 74 y/o male s/p R TKA. PMH includes HTN, DM, depression and anxiety.  Clinical Impression  Patient presents with pain and post surgical deficits RLE s/p R TKA impacting mobility. Reviewed exercises and knee precautions. Tolerated ambulation with Min guard assist for safety. Pt will have 24/7 S at home. Plan for stair training in PM session as tolerated. Will continue to follow to maximize independence and mobility prior to return home.     Follow Up Recommendations Home health PT;Supervision/Assistance - 24 hour    Equipment Recommendations  None recommended by PT    Recommendations for Other Services       Precautions / Restrictions Precautions Precautions: Knee Precaution Booklet Issued: Yes (comment) Precaution Comments: Reviewed no pillow under knee and zero degree knee Restrictions Weight Bearing Restrictions: Yes RLE Weight Bearing: Weight bearing as tolerated      Mobility  Bed Mobility Overal bed mobility: Modified Independent;Needs Assistance Bed Mobility: Supine to Sit;Sit to Supine       Sit to supine: Modified independent (Device/Increase time)   General bed mobility comments: HOB flat, no use of rails to simulate home.   Transfers Overall transfer level: Needs assistance Equipment used: Rolling walker (2 wheeled) Transfers: Sit to/from Stand Sit to Stand: Supervision         General transfer comment: Supervision for safety. Stood from [employer], toilet x1, EOB x1.   Ambulation/Gait Ambulation/Gait assistance: Min guard Ambulation Distance (Feet): 75 Feet Assistive device: Rolling walker (2 wheeled) Gait Pattern/deviations: Step-to pattern;Step-through pattern;Decreased stance time - right;Decreased step length - left;Trunk flexed   Gait velocity interpretation: Below normal speed  for age/gender General Gait Details: Cues for knee extension for quad activation during stance phase. Emphasize heel strike.  Stairs            Wheelchair Mobility    Modified Rankin (Stroke Patients Only)       Balance Overall balance assessment: Needs assistance Sitting-balance support: Feet supported;No upper extremity supported Sitting balance-Leahy Scale: Good Sitting balance - Comments: Able to donn/doff sock on LLE, assist with RLE. Able to reach outside BOS without difficulty.    Standing balance support: During functional activity Standing balance-Leahy Scale: Fair Standing balance comment: Tolerated pericare without difficulty.                             Pertinent Vitals/Pain Pain Assessment: 0-10 Pain Score: 8  Pain Location: right knee Pain Descriptors / Indicators: Sore;Aching Pain Intervention(s): Monitored during session;Repositioned;RN gave pain meds during session    Home Living Family/patient expects to be discharged to:: Private residence Living Arrangements: Spouse/significant other;Other relatives Available Help at Discharge: Family;Available 24 hours/day Type of Home: House Home Access: Stairs to enter Entrance Stairs-Rails: Left Entrance Stairs-Number of Steps: 3 Home Layout: Multi-level Home Equipment: Walker - 2 wheels;Bedside commode;Cane - single point      Prior Function Level of Independence: Independent with assistive device(s)         Comments: Pt using SPC PTA.     Hand Dominance   Dominant Hand: Right    Extremity/Trunk Assessment   Upper Extremity Assessment: Defer to OT evaluation           Lower Extremity Assessment: RLE deficits/detail RLE Deficits / Details: Limited AROM/strength 2/2 to surgery/pain.  Communication   Communication: No difficulties  Cognition Arousal/Alertness: Awake/alert Behavior During Therapy: WFL for tasks assessed/performed Overall Cognitive Status: Within  Functional Limits for tasks assessed                      General Comments      Exercises Total Joint Exercises Ankle Circles/Pumps: Both;10 reps;Seated Quad Sets: Both;10 reps;Seated Towel Squeeze: Both;10 reps;Seated Short Arc Quad: Right;10 reps;Seated Hip ABduction/ADduction: Right;10 reps;Seated Knee Flexion: Right;5 reps;Seated Goniometric ROM: 8-85 degrees knee AROM      Assessment/Plan    PT Assessment Patient needs continued PT services  PT Diagnosis Acute pain;Difficulty walking   PT Problem List Decreased strength;Pain;Decreased range of motion;Decreased balance;Decreased mobility;Decreased activity tolerance  PT Treatment Interventions Balance training;Gait training;Functional mobility training;Therapeutic activities;Therapeutic exercise;Patient/family education;Stair training   PT Goals (Current goals can be found in the Care Plan section) Acute Rehab PT Goals Patient Stated Goal: to go home tomorrow PT Goal Formulation: With patient Time For Goal Achievement: 11/30/14 Potential to Achieve Goals: Good    Frequency 7X/week   Barriers to discharge Inaccessible home environment Pt has 7 steps to get to bedroom    Co-evaluation               End of Session Equipment Utilized During Treatment: Gait belt Activity Tolerance: Patient tolerated treatment well;Patient limited by pain Patient left: in bed;with call bell/phone within reach;with family/visitor present           Time: 3546-5681 PT Time Calculation (min) (ACUTE ONLY): 39 min   Charges:   PT Evaluation $Initial PT Evaluation Tier I: 1 Procedure PT Treatments $Gait Training: 8-22 mins $Therapeutic Exercise: 8-22 mins   PT G Codes:        Kendal Ghazarian A Bayler Gehrig 11/16/2014, 10:32 AM Wray Kearns, PT, DPT [phone]

## 2014-11-16 NOTE — Progress Notes (Signed)
Subjective: 1 Day Post-Op Procedure(s) (LRB): TOTAL KNEE ARTHROPLASTY (Right) Patient reports pain as 8 on 0-10 scale.    Objective: Vital signs in last 24 hours: Temp:  [97.7 F (36.5 C)-98.5 F (36.9 C)] 98.4 F (36.9 C) (06/28 0612) Pulse Rate:  [57-110] 82 (06/28 0612) Resp:  [11-22] 16 (06/28 0612) BP: (90-123)/(44-83) 114/57 mmHg (06/28 0612) SpO2:  [94 %-98 %] 95 % (06/28 0612) Weight:  [89.812 kg (198 lb)] 89.812 kg (198 lb) (06/27 0848)  Intake/Output from previous day: 06/27 0701 - 06/28 0700 In: 2280 [P.O.:780; I.V.:1500] Out: 2250 [Urine:1850; Drains:300; Blood:100] Intake/Output this shift:     Recent Labs  11/15/14 1658  HGB 11.2*    Recent Labs  11/15/14 1658  WBC 9.3  RBC 3.54*  HCT 33.2*  PLT 312    Recent Labs  11/15/14 1658  CREATININE 1.36*   No results for input(s): LABPT, INR in the last 72 hours.  ABD soft Neurovascular intact Sensation intact distally Intact pulses distally Dorsiflexion/Plantar flexion intact Incision: dressing C/D/I  Assessment/Plan: 1 Day Post-Op Procedure(s) (LRB): TOTAL KNEE ARTHROPLASTY (Right) Advance diet Up with therapy D/C IV fluids Plan for discharge tomorrow  Increase pain medication per patient's request.  Will increase Oxy to 15-20 mg q 3 prn pain   Will increase dilaudid to 1 mg q 2 prn pain  Kutler Vanvranken J 11/16/2014, 8:02 AM

## 2014-11-17 LAB — CBC
HCT: 24.9 % — ABNORMAL LOW (ref 39.0–52.0)
Hemoglobin: 8.5 g/dL — ABNORMAL LOW (ref 13.0–17.0)
MCH: 31.7 pg (ref 26.0–34.0)
MCHC: 34.1 g/dL (ref 30.0–36.0)
MCV: 92.9 fL (ref 78.0–100.0)
Platelets: 280 10*3/uL (ref 150–400)
RBC: 2.68 MIL/uL — ABNORMAL LOW (ref 4.22–5.81)
RDW: 12.8 % (ref 11.5–15.5)
WBC: 9.3 10*3/uL (ref 4.0–10.5)

## 2014-11-17 LAB — BASIC METABOLIC PANEL
Anion gap: 9 (ref 5–15)
BUN: 18 mg/dL (ref 6–20)
CO2: 29 mmol/L (ref 22–32)
Calcium: 8.8 mg/dL — ABNORMAL LOW (ref 8.9–10.3)
Chloride: 97 mmol/L — ABNORMAL LOW (ref 101–111)
Creatinine, Ser: 1.12 mg/dL (ref 0.61–1.24)
GFR calc Af Amer: >60 mL/min (ref >60)
GFR calc non Af Amer: >60 mL/min (ref >60)
Glucose, Bld: 182 mg/dL — ABNORMAL HIGH (ref 65–99)
Potassium: 4.4 mmol/L (ref 3.5–5.1)
Sodium: 135 mmol/L (ref 135–145)

## 2014-11-17 LAB — GLUCOSE, CAPILLARY
Glucose-Capillary: 149 mg/dL — ABNORMAL HIGH (ref 65–99)
Glucose-Capillary: 268 mg/dL — ABNORMAL HIGH (ref 65–99)

## 2014-11-17 MED ORDER — SACCHAROMYCES BOULARDII 250 MG PO CAPS: 250.0000 mg | ORAL_CAPSULE | Freq: Two times a day (BID) | ORAL | Status: DC

## 2014-11-17 MED ORDER — DOCUSATE SODIUM 100 MG PO CAPS: 100.0000 mg | ORAL_CAPSULE | Freq: Two times a day (BID) | ORAL | Status: DC

## 2014-11-17 MED ORDER — POLYETHYLENE GLYCOL 3350 17 G PO PACK: 17.0000 g | PACK | Freq: Two times a day (BID) | ORAL | Status: DC

## 2014-11-17 MED ORDER — OXYCODONE HCL 15 MG PO TABS: ORAL_TABLET | ORAL | Status: DC

## 2014-11-17 MED ORDER — CELECOXIB 200 MG PO CAPS: 200.0000 mg | ORAL_CAPSULE | Freq: Every day | ORAL | Status: DC

## 2014-11-17 MED ORDER — ENOXAPARIN SODIUM 30 MG/0.3ML ~~LOC~~ SOLN: 30.0000 mg | SUBCUTANEOUS | Status: DC

## 2014-11-17 NOTE — Discharge Summary (Signed)
Patient ID: James Hood MRN: [id number] DOB/AGE: 75/15/1942 75 y.o.  Admit date: 11/15/2014 Discharge date: 11/17/2014  Admission Diagnoses:  Principal Problem:   Primary localized osteoarthritis of right knee Active Problems:   Hypertension   Diabetes mellitus type 2 in nonobese   Tremor, essential   Anxiety and depression   DJD (degenerative joint disease) of knee   Discharge Diagnoses:  Same  Past Medical History  Diagnosis Date  . Hypertension   . Anxiety and depression   . Neuropathic pain   . Diabetes mellitus type 2 in nonobese   . Tremor, essential   . Hypercholesteremia   . Primary localized osteoarthritis of right knee   . GERD (gastroesophageal reflux disease)   . Depression   . Anxiety     Surgeries: Procedure(s): TOTAL KNEE ARTHROPLASTY on 11/15/2014   Consultants:    Discharged Condition: Improved  Hospital Course: James Hood is an 74 y.o. male who was admitted 11/15/2014 for operative treatment ofPrimary localized osteoarthritis of right knee. Patient has severe unremitting pain that affects sleep, daily activities, and work/hobbies. After pre-op clearance the patient was taken to the operating room on 11/15/2014 and underwent  Procedure(s): TOTAL KNEE ARTHROPLASTY.    Patient was given perioperative antibiotics: Anti-infectives    Start     Dose/Rate Route Frequency Ordered Stop   11/15/14 1730  ceFAZolin (ANCEF) IVPB 2 g/50 mL premix     2 g 100 mL/hr over 30 Minutes Intravenous Every 6 hours 11/15/14 1553 11/16/14 0335   11/15/14 0751  ceFAZolin (ANCEF) IVPB 2 g/50 mL premix     2 g 100 mL/hr over 30 Minutes Intravenous On call to O.R. 11/15/14 0751 11/15/14 1132       Patient was given sequential compression devices, early ambulation, and chemoprophylaxis to prevent DVT.  Patient benefited maximally from hospital stay and there were no complications.    Recent vital signs: Patient Vitals for the past 24 hrs:  BP Temp Temp src  Pulse Resp SpO2  11/17/14 1157 115/70 mmHg 97.7 F (36.5 C) Oral 80 17 95 %  11/17/14 1124 114/71 mmHg - - - - -  11/17/14 0459 114/71 mmHg 97.7 F (36.5 C) - 84 16 94 %  11/16/14 2009 125/62 mmHg 98.4 F (36.9 C) - 97 16 93 %  11/16/14 1256 (!) 99/47 mmHg 97.8 F (36.6 C) - 94 16 95 %     Recent laboratory studies:  Recent Labs  11/16/14 0821 11/17/14 0433  WBC 10.2 9.3  HGB 10.1* 8.5*  HCT 29.5* 24.9*  PLT 311 280  NA 132* 135  K 4.3 4.4  CL 98* 97*  CO2 28 29  BUN 20 18  CREATININE 1.31* 1.12  GLUCOSE 182* 182*  CALCIUM 8.6* 8.8*     Discharge Medications:     Medication List    STOP taking these medications        aspirin 81 MG chewable tablet     diphenoxylate-atropine 2.5-0.025 MG per tablet  Commonly known as:  LOMOTIL     magnesium oxide 400 MG tablet  Commonly known as:  MAG-OX     multivitamin tablet      TAKE these medications        acetaminophen 500 MG tablet  Commonly known as:  TYLENOL  Take 1,000 mg by mouth every 6 (six) hours as needed for mild pain.     buPROPion 150 MG 12 hr tablet  Commonly known as:  ZYBAN  Take  150 mg by mouth 2 (two) times daily.     celecoxib 200 MG capsule  Commonly known as:  CELEBREX  Take 1 capsule (200 mg total) by mouth daily.     cetirizine 10 MG chewable tablet  Commonly known as:  ZYRTEC  Chew 10 mg by mouth daily.     cyclobenzaprine 10 MG tablet  Commonly known as:  FLEXERIL  Take 10 mg by mouth 3 (three) times daily as needed for muscle spasms.     divalproex 250 MG 24 hr tablet  Commonly known as:  DEPAKOTE ER  Take 250-500 mg by mouth See admin instructions. Pt takes 500mg  in the morning, and 250mg  in the evening     docusate sodium 100 MG capsule  Commonly known as:  COLACE  Take 1 capsule (100 mg total) by mouth 2 (two) times daily.     donepezil 10 MG tablet  Commonly known as:  ARICEPT  Take 10 mg by mouth at bedtime.     enoxaparin 30 MG/0.3ML injection  Commonly known as:   LOVENOX  Inject 0.3 mLs (30 mg total) into the skin daily.     fenofibrate 145 MG tablet  Commonly known as:  TRICOR  Take 145 mg by mouth daily.     gabapentin 300 MG capsule  Commonly known as:  NEURONTIN  Take 900 mg by mouth 2 (two) times daily.     glimepiride 4 MG tablet  Commonly known as:  AMARYL  Take 4 mg by mouth 2 (two) times daily.     lisinopril-hydrochlorothiazide 20-25 MG per tablet  Commonly known as:  PRINZIDE,ZESTORETIC  Take 1 tablet by mouth 2 (two) times daily.     memantine 5 MG tablet  Commonly known as:  NAMENDA  Take 5 mg by mouth 2 (two) times daily.     metFORMIN 500 MG tablet  Commonly known as:  GLUCOPHAGE  Take 1,000 mg by mouth 2 (two) times daily with a meal.     metoprolol tartrate 25 MG tablet  Commonly known as:  LOPRESSOR  Take 25 mg by mouth 2 (two) times daily.     omeprazole 20 MG tablet  Commonly known as:  PRILOSEC OTC  Take 20 mg by mouth 2 (two) times daily.     oxyCODONE 15 MG immediate release tablet  Commonly known as:  ROXICODONE  1-2 tablets every 4-6 hrs as needed for pain     polyethylene glycol packet  Commonly known as:  MIRALAX / GLYCOLAX  Take 17 g by mouth 2 (two) times daily.     primidone 50 MG tablet  Commonly known as:  MYSOLINE  Take 150-200 mg by mouth See admin instructions. Pt takes 200mg  in the morning, and 150mg  in the evening     saccharomyces boulardii 250 MG capsule  Commonly known as:  FLORASTOR  Take 1 capsule (250 mg total) by mouth 2 (two) times daily.     simvastatin 20 MG tablet  Commonly known as:  ZOCOR  Take 20 mg by mouth daily.     traZODone 150 MG tablet  Commonly known as:  DESYREL  Take 150 mg by mouth at bedtime.        Diagnostic Studies: No results found.  Disposition: Final discharge disposition not confirmed      Discharge Instructions    CPM    Complete by:  As directed   Continuous passive motion machine (CPM):      Use the CPM  from 0 to 90 for 6 hours per  day.       You may break it up into 2 or 3 sessions per day.      Use CPM for 2 weeks or until you are told to stop.     Call MD / Call 911    Complete by:  As directed   If you experience chest pain or shortness of breath, CALL 911 and be transported to the hospital emergency room.  If you develope a fever above 101 F, pus (white drainage) or increased drainage or redness at the wound, or calf pain, call your surgeon's office.     Change dressing    Complete by:  As directed   Change the gauze dressing daily with sterile 4 x 4 inch gauze and apply TED hose.  DO NOT REMOVE BANDAGE OVER SURGICAL INCISION.  [place] WHOLE LEG INCLUDING OVER THE WATERPROOF BANDAGE WITH SOAP AND WATER EVERY DAY.     Constipation Prevention    Complete by:  As directed   Drink plenty of fluids.  Prune juice may be helpful.  You may use a stool softener, such as Colace (over the counter) 100 mg twice a day.  Use MiraLax (over the counter) for constipation as needed.     Diet - low sodium heart healthy    Complete by:  As directed      Discharge instructions    Complete by:  As directed   INSTRUCTIONS AFTER JOINT REPLACEMENT   Remove items at home which could result in a fall. This includes throw rugs or furniture in walking pathways ICE to the affected joint every three hours while awake for 30 minutes at a time, for at least the first 3-5 days, and then as needed for pain and swelling.  Continue to use ice for pain and swelling. You may notice swelling that will progress down to the foot and ankle.  This is normal after surgery.  Elevate your leg when you are not up walking on it.   Continue to use the breathing machine you got in the hospital (incentive spirometer) which will help keep your temperature down.  It is common for your temperature to cycle up and down following surgery, especially at night when you are not up moving around and exerting yourself.  The breathing machine keeps your lungs expanded and your  temperature down.   DIET:  As you were doing prior to hospitalization, we recommend a well-balanced diet.  DRESSING / WOUND CARE / SHOWERING  Keep the surgical dressing until follow up.  The dressing is water proof, so you can shower without any extra covering.  IF THE DRESSING FALLS OFF or the wound gets wet inside, change the dressing with sterile gauze.  Please use good hand washing techniques before changing the dressing.  Do not use any lotions or creams on the incision until instructed by your surgeon.    ACTIVITY  Increase activity slowly as tolerated, but follow the weight bearing instructions below.   No driving for 6 weeks or until further direction given by your physician.  You cannot drive while taking narcotics.  No lifting or carrying greater than 10 lbs. until further directed by your surgeon. Avoid periods of inactivity such as sitting longer than an hour when not asleep. This helps prevent blood clots.  You may return to work once you are authorized by your doctor.     WEIGHT BEARING   Weight bearing as tolerated  with assist device (walker, cane, etc) as directed, use it as long as suggested by your surgeon or therapist, typically at least 2 weeks.   EXERCISES  Results after joint replacement surgery are often greatly improved when you follow the exercise, range of motion and muscle strengthening exercises prescribed by your doctor. Safety measures are also important to protect the joint from further injury. Any time any of these exercises cause you to have increased pain or swelling, decrease what you are doing until you are comfortable again and then slowly increase them. If you have problems or questions, call your caregiver or physical therapist for advice.   Rehabilitation is important following a joint replacement. After just a few days of immobilization, the muscles of the leg can become weakened and shrink (atrophy).  These exercises are designed to build up the  tone and strength of the thigh and leg muscles and to improve motion. Often times heat used for twenty to thirty minutes before working out will loosen up your tissues and help with improving the range of motion but do not use heat for the first two weeks following surgery (sometimes heat can increase post-operative swelling).   These exercises can be done on a training (exercise) mat, on the floor, on a table or on a bed. Use whatever works the best and is most comfortable for you.    Use music or television while you are exercising so that the exercises are a pleasant break in your day. This will make your life better with the exercises acting as a break in your routine that you can look forward to.   Perform all exercises about fifteen times, three times per day or as directed.  You should exercise both the operative leg and the other leg as well.   Exercises include:   Quad Sets - Tighten up the muscle on the front of the thigh (Quad) and hold for 5-10 seconds.   Straight Leg Raises - With your knee straight (if you were given a brace, keep it on), lift the leg to 60 degrees, hold for 3 seconds, and slowly lower the leg.  Perform this exercise against resistance later as your leg gets stronger.  Leg Slides: Lying on your back, slowly slide your foot toward your buttocks, bending your knee up off the floor (only go as far as is comfortable). Then slowly slide your foot back down until your leg is flat on the floor again.  Angel Wings: Lying on your back spread your legs to the side as far apart as you can without causing discomfort.  Hamstring Strength:  Lying on your back, push your heel against the floor with your leg straight by tightening up the muscles of your buttocks.  Repeat, but this time bend your knee to a comfortable angle, and push your heel against the floor.  You may put a pillow under the heel to make it more comfortable if necessary.   A rehabilitation program following joint  replacement surgery can speed recovery and prevent re-injury in the future due to weakened muscles. Contact your doctor or a physical therapist for more information on knee rehabilitation.    CONSTIPATION  Constipation is defined medically as fewer than three stools per week and severe constipation as less than one stool per week.  Even if you have a regular bowel pattern at home, your normal regimen is likely to be disrupted due to multiple reasons following surgery.  Combination of anesthesia, postoperative narcotics, change in  appetite and fluid intake all can affect your bowels.   YOU MUST use at least one of the following options; they are listed in order of increasing strength to get the job done.  They are all available over the counter, and you may need to use some, POSSIBLY even all of these options:    Drink plenty of fluids (prune juice may be helpful) and high fiber foods Colace 100 mg by mouth twice a day  Senokot for constipation as directed and as needed Dulcolax (bisacodyl), take with full glass of water  Miralax (polyethylene glycol) once or twice a day as needed.  If you have tried all these things and are unable to have a bowel movement in the first 3-4 days after surgery call either your surgeon or your primary doctor.    If you experience loose stools or diarrhea, hold the medications until you stool forms back up.  If your symptoms do not get better within 1 week or if they get worse, check with your doctor.  If you experience "the worst abdominal pain ever" or develop nausea or vomiting, please contact the office immediately for further recommendations for treatment.   ITCHING:  If you experience itching with your medications, try taking only a single pain pill, or even half a pain pill at a time.  You can also use Benadryl over the counter for itching or also to help with sleep.   TED HOSE STOCKINGS:  Use stockings on both legs until for at least 2 weeks or as directed by  physician office. They may be removed at night for sleeping.  MEDICATIONS:  See your medication summary on the "After Visit Summary" that nursing will review with you.  You may have some home medications which will be placed on hold until you complete the course of blood thinner medication.  It is important for you to complete the blood thinner medication as prescribed.  PRECAUTIONS:  If you experience chest pain or shortness of breath - call 911 immediately for transfer to the hospital emergency department.   If you develop a fever greater that 101 F, purulent drainage from wound, increased redness or drainage from wound, foul odor from the wound/dressing, or calf pain - CONTACT YOUR SURGEON.                                                   FOLLOW-UP APPOINTMENTS:  If you do not already have a post-op appointment, please call the office for an appointment to be seen by your surgeon.  Guidelines for how soon to be seen are listed in your "After Visit Summary", but are typically between 1-4 weeks after surgery.  OTHER INSTRUCTIONS:   Knee Replacement:  Do not place pillow under knee, focus on keeping the knee straight while resting. CPM instructions: 0-90 degrees, 2 hours in the morning, 2 hours in the afternoon, and 2 hours in the evening. Place foam block, curve side up under heel at all times except when in CPM or when walking.  DO NOT modify, tear, cut, or change the foam block in any way.  MAKE SURE YOU:  Understand these instructions.  Get help right away if you are not doing well or get worse.    Thank you for letting us be a part of your medical care team.  It is  a privilege we respect greatly.  We hope these instructions will help you stay on track for a fast and full recovery!     Do not put a pillow under the knee. Place it under the heel.    Complete by:  As directed   Place gray foam block, curve side up under heel at all times except when in CPM or when walking.  DO NOT modify,  tear, cut, or change in any way the gray foam block.     Increase activity slowly as tolerated    Complete by:  As directed      TED hose    Complete by:  As directed   Use stockings (TED hose) for 2 weeks on both leg(s).  You may remove them at night for sleeping.           Follow-up Information    Follow up with [facility].   Why:  They will contact you to schedule home therapy visits.   Contact information:   [address] [place] [address] [phone]       Follow up with Lorn Junes, MD On 11/29/2014.   Specialty:  Orthopedic Surgery   Why:  appt time 3:30 pm   Contact information:   [address] [place] [address] [phone]        Signed: Linda Hedges 11/17/2014, 12:46 PM

## 2014-11-17 NOTE — Progress Notes (Signed)
Physical Therapy Treatment Patient Details Name: James Hood MRN: [id number] DOB: 06-06-1940 Today's Date: 11/17/2014    History of Present Illness Patient is a 74 y/o male s/p R TKA. PMH includes HTN, DM, depression and anxiety.    PT Comments    Patient is progressing well with ambulation. Less guarded this afternoon and appeared to walk with more ease. Deferred further practice of steps as he did well with those this afternoon. Patient safe to D/C from a mobility standpoint based on progression towards goals set on PT eval.    Follow Up Recommendations  Home health PT;Supervision/Assistance - 24 hour     Equipment Recommendations  None recommended by PT    Recommendations for Other Services       Precautions / Restrictions Precautions Precautions: Knee Precaution Comments: Reviewed no pillow under knee and zero degree knee Restrictions RLE Weight Bearing: Weight bearing as tolerated    Mobility  Bed Mobility Overal bed mobility: Modified Independent                Transfers Overall transfer level: Modified independent                  Ambulation/Gait Ambulation/Gait assistance: Supervision Ambulation Distance (Feet): 300 Feet Assistive device: Rolling walker (2 wheeled) Gait Pattern/deviations: Step-through pattern;Decreased stride length Gait velocity: increasing   General Gait Details: Patient more relaxed with ambuation this session and walked with increased cadence.    Stairs         General stair comments: Patient deferred further practice of steps this afternoon  Wheelchair Mobility    Modified Rankin (Stroke Patients Only)       Balance                                    Cognition Arousal/Alertness: Awake/alert Behavior During Therapy: WFL for tasks assessed/performed Overall Cognitive Status: Within Functional Limits for tasks assessed                      Exercises Total Joint  Exercises Quad Sets: Both;10 reps;Seated Straight Leg Raises: AROM;Right;10 reps Long Arc Quad: AROM;10 reps;Right    General Comments        Pertinent Vitals/Pain Pain Score: 3  Pain Location: R knee Pain Descriptors / Indicators: Aching;Sore Pain Intervention(s): Monitored during session    Home Living                      Prior Function            PT Goals (current goals can now be found in the care plan section) Progress towards PT goals: Progressing toward goals    Frequency  7X/week    PT Plan Current plan remains appropriate    Co-evaluation             End of Session Equipment Utilized During Treatment: Gait belt Activity Tolerance: Patient tolerated treatment well Patient left: in chair;with call bell/phone within reach     Time: 5009-3818 PT Time Calculation (min) (ACUTE ONLY): 17 min  Charges:  $Gait Training: 8-22 mins                    G Codes:      Jacqualyn Posey 11/17/2014, 1:53 PM 11/17/2014 Jacqualyn Posey PTA [phone] pager [phone] office

## 2014-11-17 NOTE — Care Management (Signed)
Important Message  Patient Details  Name: BRAULIO KIEDROWSKI MRN: [id number] Date of Birth: 1941/04/21   Medicare Important Message Given:  Yes-second notification given    Louanne Belton 11/17/2014, 2:57 PM

## 2014-11-17 NOTE — Progress Notes (Signed)
Physical Therapy Treatment Patient Details Name: James Hood MRN: [id number] DOB: 02-26-1941 Today's Date: 11/17/2014    History of Present Illness Patient is a 74 y/o male s/p R TKA. PMH includes HTN, DM, depression and anxiety.    PT Comments    Patient progressing well. Still very guarded and slow with gait due to pain. Able to complete stair training this morning. Patient safe to D/C from a mobility standpoint based on progression towards goals set on PT eval.    Follow Up Recommendations  Home health PT;Supervision/Assistance - 24 hour     Equipment Recommendations  None recommended by PT    Recommendations for Other Services       Precautions / Restrictions Precautions Precautions: Knee Precaution Comments: Reviewed no pillow under knee and zero degree knee Restrictions RLE Weight Bearing: Weight bearing as tolerated    Mobility  Bed Mobility Overal bed mobility: Modified Independent             General bed mobility comments: HOB flat, no use of rails to simulate home.   Transfers Overall transfer level: Needs assistance Equipment used: Rolling walker (2 wheeled)   Sit to Stand: Supervision            Ambulation/Gait Ambulation/Gait assistance: Supervision Ambulation Distance (Feet): 250 Feet     Gait velocity: guarded Gait velocity interpretation: Below normal speed for age/gender General Gait Details: Cues to increase weight through LEs and not lean into RW   Stairs   Stairs assistance: Supervision Stair Management: Two rails;Forwards Number of Stairs: 10 General stair comments: patient with safe demo of steps  Wheelchair Mobility    Modified Rankin (Stroke Patients Only)       Balance                                    Cognition Arousal/Alertness: Awake/alert Behavior During Therapy: WFL for tasks assessed/performed Overall Cognitive Status: Within Functional Limits for tasks assessed                       Exercises      General Comments        Pertinent Vitals/Pain Pain Score: 7  Pain Location: R knee Pain Descriptors / Indicators: Aching;Sore Pain Intervention(s): Monitored during session    Home Living                      Prior Function            PT Goals (current goals can now be found in the care plan section) Progress towards PT goals: Progressing toward goals    Frequency  7X/week    PT Plan Current plan remains appropriate    Co-evaluation             End of Session Equipment Utilized During Treatment: Gait belt Activity Tolerance: Patient tolerated treatment well Patient left: in bed;in CPM;with call bell/phone within reach     Time: 0749-0830 PT Time Calculation (min) (ACUTE ONLY): 41 min  Charges:  $Gait Training: 23-37 mins $Therapeutic Activity: 8-22 mins                    G Codes:      Jacqualyn Posey 11/17/2014, 8:43 AM  11/17/2014 Jacqualyn Posey PTA [phone] pager [phone] office

## 2014-11-23 DIAGNOSIS — R55 Syncope and collapse: Secondary | ICD-10-CM | POA: Insufficient documentation

## 2015-02-23 DIAGNOSIS — E785 Hyperlipidemia, unspecified: Secondary | ICD-10-CM | POA: Insufficient documentation

## 2015-03-10 DIAGNOSIS — G250 Essential tremor: Secondary | ICD-10-CM | POA: Insufficient documentation

## 2015-05-03 DIAGNOSIS — R29810 Facial weakness: Secondary | ICD-10-CM | POA: Insufficient documentation

## 2015-05-11 ENCOUNTER — Other Ambulatory Visit: Payer: Self-pay | Admitting: Neurology

## 2015-05-11 DIAGNOSIS — R262 Difficulty in walking, not elsewhere classified: Secondary | ICD-10-CM

## 2015-05-11 DIAGNOSIS — R29810 Facial weakness: Secondary | ICD-10-CM

## 2015-05-17 ENCOUNTER — Ambulatory Visit: Admission: RE | Admit: 2015-05-17 | Payer: Commercial Managed Care - HMO | Source: Ambulatory Visit

## 2015-05-24 DIAGNOSIS — E785 Hyperlipidemia, unspecified: Secondary | ICD-10-CM | POA: Diagnosis not present

## 2015-05-24 DIAGNOSIS — M1380 Other specified arthritis, unspecified site: Secondary | ICD-10-CM | POA: Diagnosis not present

## 2015-05-24 DIAGNOSIS — I10 Essential (primary) hypertension: Secondary | ICD-10-CM | POA: Diagnosis not present

## 2015-05-24 DIAGNOSIS — K219 Gastro-esophageal reflux disease without esophagitis: Secondary | ICD-10-CM | POA: Diagnosis not present

## 2015-05-24 DIAGNOSIS — Z7982 Long term (current) use of aspirin: Secondary | ICD-10-CM | POA: Diagnosis not present

## 2015-05-24 DIAGNOSIS — K2270 Barrett's esophagus without dysplasia: Secondary | ICD-10-CM | POA: Diagnosis not present

## 2015-05-24 DIAGNOSIS — K319 Disease of stomach and duodenum, unspecified: Secondary | ICD-10-CM | POA: Diagnosis not present

## 2015-05-24 DIAGNOSIS — Z794 Long term (current) use of insulin: Secondary | ICD-10-CM | POA: Diagnosis not present

## 2015-05-24 DIAGNOSIS — Z79899 Other long term (current) drug therapy: Secondary | ICD-10-CM | POA: Diagnosis not present

## 2015-05-24 DIAGNOSIS — Z09 Encounter for follow-up examination after completed treatment for conditions other than malignant neoplasm: Secondary | ICD-10-CM | POA: Diagnosis not present

## 2015-05-24 DIAGNOSIS — Z7984 Long term (current) use of oral hypoglycemic drugs: Secondary | ICD-10-CM | POA: Diagnosis not present

## 2015-05-24 DIAGNOSIS — E119 Type 2 diabetes mellitus without complications: Secondary | ICD-10-CM | POA: Diagnosis not present

## 2015-05-27 ENCOUNTER — Ambulatory Visit
Admission: RE | Admit: 2015-05-27 | Discharge: 2015-05-27 | Disposition: A | Discharge status: Home / Self Care | Payer: PPO | Source: Ambulatory Visit | Attending: Neurology | Admitting: Neurology

## 2015-05-27 DIAGNOSIS — R262 Difficulty in walking, not elsewhere classified: Secondary | ICD-10-CM | POA: Diagnosis not present

## 2015-05-27 DIAGNOSIS — R29810 Facial weakness: Secondary | ICD-10-CM | POA: Insufficient documentation

## 2015-05-27 DIAGNOSIS — G3189 Other specified degenerative diseases of nervous system: Secondary | ICD-10-CM | POA: Insufficient documentation

## 2015-05-27 DIAGNOSIS — J320 Chronic maxillary sinusitis: Secondary | ICD-10-CM | POA: Insufficient documentation

## 2015-05-27 DIAGNOSIS — R269 Unspecified abnormalities of gait and mobility: Secondary | ICD-10-CM | POA: Diagnosis not present

## 2015-06-01 DIAGNOSIS — R200 Anesthesia of skin: Secondary | ICD-10-CM | POA: Diagnosis not present

## 2015-06-01 DIAGNOSIS — R413 Other amnesia: Secondary | ICD-10-CM | POA: Insufficient documentation

## 2015-06-01 DIAGNOSIS — E119 Type 2 diabetes mellitus without complications: Secondary | ICD-10-CM | POA: Insufficient documentation

## 2015-06-01 DIAGNOSIS — R262 Difficulty in walking, not elsewhere classified: Secondary | ICD-10-CM | POA: Diagnosis not present

## 2015-06-01 DIAGNOSIS — Z720 Tobacco use: Secondary | ICD-10-CM | POA: Diagnosis not present

## 2015-06-01 DIAGNOSIS — R251 Tremor, unspecified: Secondary | ICD-10-CM | POA: Diagnosis not present

## 2015-06-06 DIAGNOSIS — K580 Irritable bowel syndrome with diarrhea: Secondary | ICD-10-CM | POA: Diagnosis not present

## 2015-06-29 DIAGNOSIS — B9689 Other specified bacterial agents as the cause of diseases classified elsewhere: Secondary | ICD-10-CM | POA: Diagnosis not present

## 2015-06-29 DIAGNOSIS — J329 Chronic sinusitis, unspecified: Secondary | ICD-10-CM | POA: Diagnosis not present

## 2015-07-18 DIAGNOSIS — K580 Irritable bowel syndrome with diarrhea: Secondary | ICD-10-CM | POA: Diagnosis not present

## 2015-07-27 DIAGNOSIS — R251 Tremor, unspecified: Secondary | ICD-10-CM | POA: Diagnosis not present

## 2015-07-27 DIAGNOSIS — E119 Type 2 diabetes mellitus without complications: Secondary | ICD-10-CM | POA: Diagnosis not present

## 2015-07-27 DIAGNOSIS — R413 Other amnesia: Secondary | ICD-10-CM | POA: Diagnosis not present

## 2015-07-27 DIAGNOSIS — Z794 Long term (current) use of insulin: Secondary | ICD-10-CM | POA: Diagnosis not present

## 2015-07-27 DIAGNOSIS — G250 Essential tremor: Secondary | ICD-10-CM | POA: Diagnosis not present

## 2015-07-27 DIAGNOSIS — R262 Difficulty in walking, not elsewhere classified: Secondary | ICD-10-CM | POA: Diagnosis not present

## 2015-07-27 DIAGNOSIS — R200 Anesthesia of skin: Secondary | ICD-10-CM | POA: Diagnosis not present

## 2015-08-08 DIAGNOSIS — M545 Low back pain: Secondary | ICD-10-CM | POA: Diagnosis not present

## 2015-08-08 DIAGNOSIS — M25552 Pain in left hip: Secondary | ICD-10-CM | POA: Diagnosis not present

## 2015-08-08 DIAGNOSIS — M25551 Pain in right hip: Secondary | ICD-10-CM | POA: Diagnosis not present

## 2015-08-09 ENCOUNTER — Ambulatory Visit: Payer: PPO

## 2015-08-15 ENCOUNTER — Ambulatory Visit: Payer: PPO | Attending: Neurology

## 2015-08-15 DIAGNOSIS — R2681 Unsteadiness on feet: Secondary | ICD-10-CM | POA: Insufficient documentation

## 2015-08-15 NOTE — Therapy (Signed)
[place] MAIN [facility] SERVICES [address] [place], [place], [address] Phone: [phone]   Fax:  [phone]  Physical Therapy Evaluation  Patient Details  Name: James Hood MRN: [id number] Date of Birth: May 21, 1941 Referring Provider: Melrose Nakayama  Encounter Date: 08/15/2015      PT End of Session - 08/15/15 0958    Visit Number 1   Number of Visits 13   Date for PT Re-Evaluation 09/12/15   Authorization Type 1/10   PT Start Time 0900   PT Stop Time 0945   PT Time Calculation (min) 45 min   Equipment Utilized During Treatment Gait belt   Activity Tolerance Patient tolerated treatment well   Behavior During Therapy [facility] for tasks assessed/performed      Past Medical History  Diagnosis Date  . Hypertension   . Anxiety and depression   . Neuropathic pain   . Diabetes mellitus type 2 in nonobese (HCC)   . Tremor, essential   . Hypercholesteremia   . Primary localized osteoarthritis of right knee   . GERD (gastroesophageal reflux disease)   . Depression   . Anxiety     Past Surgical History  Procedure Laterality Date  . Esophageal stretch    . Tonsillectomy    . 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  Visit Diagnosis:  Unsteadiness on feet - Plan: PT plan of care cert/re-cert      Subjective Assessment - 08/15/15 0912    Subjective pt reports being prescribed a new medicine which he felt was affecting his balance. pt reports his balance has improved some since he stopped taking this medicine (MD is aware), but has had over a dozen falls the past 6 months. pt reports he is unsure why he is falling. pt describes himself as sedentary by choice. pt reports reduced function over the past year with increased LE weakness. pt does have neuropathy in the feet.  pt reports he is wanting to go to therapy for his back and hip pain as well.    Pertinent History R TKA in June 2016   Diagnostic tests Xray    Patient Stated Goals improve balance and reduce hip pain when walking    Currently in Pain? Yes   Pain Score 3    Pain Location --  L lateral hip            [facility] PT Assessment - 08/15/15 0001    Assessment   Medical Diagnosis difficulty walking   Referring Provider potter   Onset Date/Surgical Date 08/14/09   Precautions   Precautions Fall   Balance Screen   Has the patient fallen in the past 6 months Yes   How many times? 12   Has the patient had a decrease in activity level because of a fear of falling?  Yes   Is the patient reluctant to leave their home because of a fear of falling?  Yes   [place] Private residence   Living Arrangements Spouse/significant other   Available Help at Discharge Family   Type of [place] to enter   Entrance Stairs-Number of Steps 5   Entrance Stairs-Rails Left;Right   Prior Function   Level of Independence Independent with household mobility without device;Independent with  basic ADLs  pt reports he could walk a mile   Standardized Balance Assessment   Standardized Balance Assessment Berg Balance Test;10 meter walk test;Five Times Sit to Stand;Timed Up and Go Test   Five times sit to stand comments  21   10 Meter Walk 0.87   Berg Balance Test   Sit to Stand Able to stand without using hands and stabilize independently   Standing Unsupported Able to stand safely 2 minutes   Sitting with Back Unsupported but Feet Supported on Floor or Stool Able to sit safely and securely 2 minutes   Stand to Sit Sits safely with minimal use of hands   Transfers Able to transfer safely, minor use of hands   Standing Unsupported with Eyes Closed Able to stand 10 seconds with supervision   Standing Ubsupported with Feet Together Able to place feet together  independently and stand for 1 minute with supervision   From Standing, Reach Forward with Outstretched Arm Can reach forward >5 cm safely (2")   From Standing Position, Pick up Object from Floor Able to pick up shoe, needs supervision   From Standing Position, Turn to Look Behind Over each Shoulder Turn sideways only but maintains balance   Turn 360 Degrees Able to turn 360 degrees safely but slowly   Standing Unsupported, Alternately Place Feet on Step/Stool Able to complete >2 steps/needs minimal assist   Standing Unsupported, One Foot in Front Able to plae foot ahead of the other independently and hold 30 seconds   Standing on One Leg Tries to lift leg/unable to hold 3 seconds but remains standing independently   Total Score 40   Timed Up and Go Test   Normal TUG (seconds) 11.3        POSTURE/OBSERVATION: Pt in no acute distress. Increased kyphosis.   PROM/AROM:  Pt lacking terminal R knee ext x 5 deg.   STRENGTH:  Graded on a 0-5 scale  Left Right                          Hip Flex 4- 3+  Hip Abd 4- 4-  Hip Add 4- 4-  Hip Ext 4- 4-  Hip IR/ER    Knee Flex 4- 4-  Knee Ext 4 4  Ankle DF 4 4  PF 4 4   SENSATION: Reduced light touch sensation to the B feet  SPECIAL TESTS:  (-) clonus  GAIT:  Pt has leg length discrepancy : LLE 3/4in shorter Pt walk with general unsteadiness R knee flexed in stance, reduced heel/toe transfer .                      PT Education - 08/15/15 0958    Education provided Yes   Education Details plan of care   Person(s) Educated Patient   Methods Explanation   Comprehension Verbalized understanding             PT Long Term Goals - 08/15/15 1007    PT LONG TERM GOAL #1   Title pt will improve berg balance scale to >46/56 to redce fall risk    Baseline 40/56   Time 6   Period Weeks   Status New   PT LONG TERM GOAL #2   Title pt will improve gait speed to 1.15m/s with LRAD to improve community mobility     Time 6   Period Weeks   Status New   PT LONG TERM  GOAL #3   Title pt will perform 5x sit to stand in <15s demonstrating reduced fall risk.    Time 6   Period Weeks   Status New               Plan - 08/24/15 0959    Clinical Impression Statement pt presents with increasing fall frequency over the past year .pt has reduced LE strength, impaired balance, impaired foot sensation and reduced activity tolerance. pt would benefit from skilled PT services to improve these impairments and reduce fall risk.    Pt will benefit from skilled therapeutic intervention in order to improve on the following deficits Abnormal gait;Decreased strength;Pain;Decreased endurance;Impaired sensation;Difficulty walking;Decreased balance   Rehab Potential Fair   Clinical Impairments Affecting Rehab Potential recent TKE, DM, HTN, Depression/anxiety.    PT Frequency 2x / week   PT Duration 6 weeks   PT Treatment/Interventions ADLs/Self Care Home Management;Patient/family education;Neuromuscular re-education;Balance training;Therapeutic exercise;Therapeutic activities;Functional mobility training;Stair training;Gait training   PT Next Visit Plan HEP          G-Codes - 08/24/2015 1012    Functional Assessment Tool Used berg/37mwalk/5xsittostand   Functional Limitation Mobility: Walking and moving around   Mobility: Walking and Moving Around Current Status [phone]) At least 40 percent but less than 60 percent impaired, limited or restricted   Mobility: Walking and Moving Around Goal Status [phone]) At least 20 percent but less than 40 percent impaired, limited or restricted       Problem List Patient Active Problem List   Diagnosis Date Noted  . DJD (degenerative joint disease) of knee 11/15/2014  . Hypertension   . Diabetes mellitus type 2 in nonobese (HCC)   . Tremor, essential   . Primary localized osteoarthritis of right knee   . Anxiety and depression    Caryl Pina C. Hitesh Fouche, PT, DPT  [phone]  Darrah Dredge 08-24-15, 10:13 AM  [place] MAIN [facility] SERVICES [address] [place], [place], [address] Phone: [phone]   Fax:  [phone]  Name: SHERREL WALLMAN MRN: [id number] Date of Birth: 1940-09-20

## 2015-08-17 ENCOUNTER — Ambulatory Visit: Payer: PPO

## 2015-08-17 DIAGNOSIS — R2681 Unsteadiness on feet: Secondary | ICD-10-CM

## 2015-08-17 NOTE — Patient Instructions (Addendum)
[email or web address] Sit to stand 2 x 10 (no UE) Standing hip abduction red band 1x10 Standing hip flexion red band 1x10  Standing hip extension red band 1x10 Standing ankle DF/PF 2x10  NBOS EO/EC 10s x 5 Semi tandem x 30s

## 2015-08-17 NOTE — Therapy (Signed)
[place] MAIN [facility] SERVICES [address] [place], [place], [address] Phone: [phone]   Fax:  [phone]  Physical Therapy Treatment  Patient Details  Name: James Hood MRN: [id number] Date of Birth: 07-30-1940 Referring Provider: Melrose Nakayama  Encounter Date: 08/17/2015      PT End of Session - 08/17/15 1622    Visit Number 2   Number of Visits 13   Date for PT Re-Evaluation 09/12/15   Authorization Type 2/10   PT Start Time 1530   PT Stop Time 1610   PT Time Calculation (min) 40 min   Equipment Utilized During Treatment Gait belt   Activity Tolerance Patient tolerated treatment well   Behavior During Therapy [facility] for tasks assessed/performed      Past Medical History  Diagnosis Date  . Hypertension   . Anxiety and depression   . Neuropathic pain   . Diabetes mellitus type 2 in nonobese (HCC)   . Tremor, essential   . Hypercholesteremia   . Primary localized osteoarthritis of right knee   . GERD (gastroesophageal reflux disease)   . Depression   . Anxiety     Past Surgical History  Procedure Laterality Date  . Esophageal stretch    . Tonsillectomy    . 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  Visit Diagnosis:  Unsteadiness on feet      Subjective Assessment - 08/17/15 1550    Subjective pt reports having a near fall today, but was able to catch himself.    Pertinent History R TKA in June 2016   Diagnostic tests Xray    Patient Stated Goals improve balance and reduce hip pain when walking    Currently in Pain? Yes   Pain Score 3    Pain Location --  R knee   Pain Descriptors / Indicators Aching      therex Nustep x 2 min warm up   Sit to stand 2 x 10 (no UE) Standing hip abduction red band 1x10 Standing hip flexion red band 1x10   Standing hip extension red band 1x10 Standing ankle DF/PF 2x10  NBOS EO/EC 10s x 5 Semi tandem x 30s  Pt requires min verbal and tactile cues for proper exercise performance  Pt required seated rest between exercises.                         PT Education - 08/17/15 1622    Education provided Yes   Education Details HEP compliance   Person(s) Educated Patient   Methods Explanation   Comprehension Verbalized understanding             PT Long Term Goals - 08/15/15 1007    PT LONG TERM GOAL #1   Title pt will improve berg balance scale to >46/56 to redce fall risk    Baseline 40/56   Time 6   Period Weeks   Status New   PT LONG TERM GOAL #2   Title pt will improve gait speed to 1.60m/s with LRAD to improve community mobility    Time 6   Period Weeks   Status New   PT LONG TERM GOAL #3   Title pt will perform 5x sit to stand in <15s demonstrating reduced fall risk.  Time 6   Period Weeks   Status New               Plan - 08/17/15 1622    Clinical Impression Statement pt demonstrated significantly more fatigue than expected with HEP initiation. pt reported increased LBP which is being managed at another PT establishment. HEP was introduced to pt today with emphasis on compliance and safety.    Pt will benefit from skilled therapeutic intervention in order to improve on the following deficits Abnormal gait;Decreased strength;Pain;Decreased endurance;Impaired sensation;Difficulty walking;Decreased balance   Rehab Potential Fair   Clinical Impairments Affecting Rehab Potential recent TKE, DM, HTN, Depression/anxiety.    PT Frequency 2x / week   PT Duration 6 weeks   PT Treatment/Interventions ADLs/Self Care Home Management;Patient/family education;Neuromuscular re-education;Balance training;Therapeutic exercise;Therapeutic activities;Functional mobility training;Stair training;Gait training   PT Next Visit Plan HEP        Problem  List Patient Active Problem List   Diagnosis Date Noted  . DJD (degenerative joint disease) of knee 11/15/2014  . Hypertension   . Diabetes mellitus type 2 in nonobese (HCC)   . Tremor, essential   . Primary localized osteoarthritis of right knee   . Anxiety and depression    Caryl Pina C. Jacelyn Cuen, PT, DPT [phone]  Brockton Mckesson 08/17/2015, 4:24 PM  [place] MAIN [facility] SERVICES [address] [place], [place], [address] Phone: [phone]   Fax:  [phone]  Name: LIUM KULPA MRN: [id number] Date of Birth: 08-Jul-1940

## 2015-08-22 ENCOUNTER — Ambulatory Visit: Payer: PPO | Attending: Neurology

## 2015-08-22 DIAGNOSIS — R2681 Unsteadiness on feet: Secondary | ICD-10-CM | POA: Diagnosis not present

## 2015-08-22 NOTE — Therapy (Signed)
[place] MAIN [facility] SERVICES [address] [place], [place], [address] Phone: [phone]   Fax:  [phone]  Physical Therapy Treatment  Patient Details  Name: James Hood MRN: [id number] Date of Birth: July 25, 1940 Referring Provider: Melrose Nakayama  Encounter Date: 08/22/2015      PT End of Session - 08/22/15 0852    Visit Number 3   Number of Visits 13   Date for PT Re-Evaluation 09/12/15   Authorization Type 3/10   PT Start Time 0850   PT Stop Time 0930   PT Time Calculation (min) 40 min   Equipment Utilized During Treatment Gait belt   Activity Tolerance Patient tolerated treatment well   Behavior During Therapy [facility] for tasks assessed/performed      Past Medical History  Diagnosis Date  . Hypertension   . Anxiety and depression   . Neuropathic pain   . Diabetes mellitus type 2 in nonobese (HCC)   . Tremor, essential   . Hypercholesteremia   . Primary localized osteoarthritis of right knee   . GERD (gastroesophageal reflux disease)   . Depression   . Anxiety     Past Surgical History  Procedure Laterality Date  . Esophageal stretch    . Tonsillectomy    . 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  Visit Diagnosis:  Unsteadiness on feet      Subjective Assessment - 08/22/15 0851    Subjective pt reports he was not too sore after last session. he reports he did his HEP this weekend without issue.    Pertinent History R TKA in June 2016   Diagnostic tests Xray    Patient Stated Goals improve balance and reduce hip pain when walking    Currently in Pain? No/denies      Therex: Nustep L 3 x 3 min no charge Leg press 90lbs 3x10 Heel raise on leg press 75lbs 3x10 Fwd step up onto 6inch box with single UE support x 15 BLE cues to reduce valgus  collapse R>L Side stepping with red band around knees in // bars x 5 laps Pt requires min verbal and tactile cues for proper exercise performance   NMR: Toe taps on BOSU x20 fwd/ x 10 sideways BLE Standing on AIREX normal BOS EO/EC 10s x 10 Standing on AIREX normal BOS with vertical and horiz head turns x 15 each way  pt requires CGA for safety on balance exercises  , cues for L weight shift                           PT Education - 08/22/15 0851    Education provided Yes   Education Details cues to reduce compensatory strategies.    Person(s) Educated Patient   Methods Explanation   Comprehension Verbalized understanding;Returned demonstration;Need further instruction             PT Long Term Goals - 08/15/15 1007    PT LONG TERM GOAL #1   Title pt will improve berg balance scale to >46/56 to redce fall risk    Baseline 40/56   Time 6   Period Weeks   Status New   PT LONG TERM GOAL #2   Title pt will improve gait speed to 1.109m/s with LRAD  to improve community mobility    Time 6   Period Weeks   Status New   PT LONG TERM GOAL #3   Title pt will perform 5x sit to stand in <15s demonstrating reduced fall risk.    Time 6   Period Weeks   Status New               Plan - 08/22/15 0931    Clinical Impression Statement pt did well with progression of activities. he is less anxious. pt did have increased hip / sciatic pain towards end of session, but was able to finish therapy. pt is going to PT for his back eval at another clinic this week.    Pt will benefit from skilled therapeutic intervention in order to improve on the following deficits Abnormal gait;Decreased strength;Pain;Decreased endurance;Impaired sensation;Difficulty walking;Decreased balance   Rehab Potential Fair   Clinical Impairments Affecting Rehab Potential recent TKE, DM, HTN, Depression/anxiety.    PT Frequency 2x / week   PT Duration 6 weeks   PT Treatment/Interventions  ADLs/Self Care Home Management;Patient/family education;Neuromuscular re-education;Balance training;Therapeutic exercise;Therapeutic activities;Functional mobility training;Stair training;Gait training   PT Next Visit Plan progress strength and balance as tolerated.         Problem List Patient Active Problem List   Diagnosis Date Noted  . DJD (degenerative joint disease) of knee 11/15/2014  . Hypertension   . Diabetes mellitus type 2 in nonobese (HCC)   . Tremor, essential   . Primary localized osteoarthritis of right knee   . Anxiety and depression    Caryl Pina C. Keyonda Bickle, PT, DPT [phone]  Nataline Basara 08/22/2015, 9:38 AM  [place] MAIN [facility] SERVICES [address] [place], [place], [address] Phone: [phone]   Fax:  [phone]  Name: James Hood MRN: [id number] Date of Birth: Sep 10, 1940

## 2015-08-24 ENCOUNTER — Ambulatory Visit: Payer: PPO

## 2015-08-24 DIAGNOSIS — R2681 Unsteadiness on feet: Secondary | ICD-10-CM

## 2015-08-24 NOTE — Therapy (Signed)
[place] MAIN [facility] SERVICES [address] [place], [place], [address] Phone: [phone]   Fax:  [phone]  Physical Therapy Treatment  Patient Details  Name: James Hood MRN: [id number] Date of Birth: 08-15-1940 Referring Provider: Melrose Nakayama  Encounter Date: 08/24/2015      PT End of Session - 08/24/15 0856    Visit Number 4   Number of Visits 13   Date for PT Re-Evaluation 09/12/15   Authorization Type 4/10   PT Start Time 0850   PT Stop Time 0935   PT Time Calculation (min) 45 min   Equipment Utilized During Treatment Gait belt   Activity Tolerance Patient tolerated treatment well   Behavior During Therapy [facility] for tasks assessed/performed      Past Medical History  Diagnosis Date  . Hypertension   . Anxiety and depression   . Neuropathic pain   . Diabetes mellitus type 2 in nonobese (HCC)   . Tremor, essential   . Hypercholesteremia   . Primary localized osteoarthritis of right knee   . GERD (gastroesophageal reflux disease)   . Depression   . Anxiety     Past Surgical History  Procedure Laterality Date  . Esophageal stretch    . Tonsillectomy    . 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  Visit Diagnosis:  Unsteadiness on feet      Subjective Assessment - 08/24/15 0855    Subjective pt reports he is sore after last session in the R knee and anterior ankles    Pertinent History R TKA in June 2016   Diagnostic tests Xray    Patient Stated Goals improve balance and reduce hip pain when walking    Currently in Pain? Yes   Pain Score 3    Pain Location --  R knee and anterior ankles          Nustep L 4 x 4 min no charge warm up 12.5lbs fwd resisted walk x 5 laps 7.5lbs side resisted stepping x 3 laps (cable column) Fwd step up  with single UE on rail 6inches 2x10 each leg  Toe taps on AIREX onto 6in box no UE 2x20 Pt requires min verbal and tactile cues for proper exercise performance                          PT Education - 08/24/15 0856    Education provided Yes   Education Details reduce compensatory strategies during therex   Person(s) Educated Patient   Methods Explanation   Comprehension Verbalized understanding             PT Long Term Goals - 08/15/15 1007    PT LONG TERM GOAL #1   Title pt will improve berg balance scale to >46/56 to redce fall risk    Baseline 40/56   Time 6   Period Weeks   Status New   PT LONG TERM GOAL #2   Title pt will improve gait speed to 1.30m/s with LRAD to improve community mobility    Time 6   Period Weeks   Status New   PT LONG TERM GOAL #3   Title pt will perform 5x sit to stand in <15s demonstrating reduced fall risk.    Time 6  Period Weeks   Status New               Plan - 08/24/15 0956    Clinical Impression Statement pt did well with progression of therex and balance. he is limited some by his sciatic hip pain needing rest breaks but is motivated to exercise.    Pt will benefit from skilled therapeutic intervention in order to improve on the following deficits Abnormal gait;Decreased strength;Pain;Decreased endurance;Impaired sensation;Difficulty walking;Decreased balance   Rehab Potential Fair   Clinical Impairments Affecting Rehab Potential recent TKE, DM, HTN, Depression/anxiety.    PT Frequency 2x / week   PT Duration 6 weeks   PT Treatment/Interventions ADLs/Self Care Home Management;Patient/family education;Neuromuscular re-education;Balance training;Therapeutic exercise;Therapeutic activities;Functional mobility training;Stair training;Gait training   PT Next Visit Plan progress strength and balance as tolerated.         Problem List Patient Active Problem List   Diagnosis Date Noted  . DJD (degenerative  joint disease) of knee 11/15/2014  . Hypertension   . Diabetes mellitus type 2 in nonobese (HCC)   . Tremor, essential   . Primary localized osteoarthritis of right knee   . Anxiety and depression    Caryl Pina C. Finley Dinkel, PT, DPT [phone]  Sayyid Harewood 08/24/2015, 9:58 AM  [place] MAIN [facility] SERVICES [address] [place], [place], [address] Phone: [phone]   Fax:  [phone]  Name: James Hood MRN: [id number] Date of Birth: 12/21/1940

## 2015-08-25 DIAGNOSIS — M5416 Radiculopathy, lumbar region: Secondary | ICD-10-CM | POA: Diagnosis not present

## 2015-08-25 DIAGNOSIS — M545 Low back pain: Secondary | ICD-10-CM | POA: Diagnosis not present

## 2015-08-29 ENCOUNTER — Ambulatory Visit: Payer: PPO

## 2015-08-29 DIAGNOSIS — R2681 Unsteadiness on feet: Secondary | ICD-10-CM | POA: Diagnosis not present

## 2015-08-29 NOTE — Therapy (Signed)
[place] MAIN [facility] SERVICES [address] [place], [place], [address] Phone: [phone]   Fax:  [phone]  Physical Therapy Treatment  Patient Details  Name: James Hood MRN: [id number] Date of Birth: 07/21/1940 Referring Provider: Melrose Nakayama  Encounter Date: 08/29/2015      PT End of Session - 08/29/15 0853    Visit Number 5   Number of Visits 13   Date for PT Re-Evaluation 09/12/15   Authorization Type 5/10   PT Start Time 0845   PT Stop Time 0930   PT Time Calculation (min) 45 min   Equipment Utilized During Treatment Gait belt   Activity Tolerance Patient tolerated treatment well   Behavior During Therapy [facility] for tasks assessed/performed      Past Medical History  Diagnosis Date  . Hypertension   . Anxiety and depression   . Neuropathic pain   . Diabetes mellitus type 2 in nonobese (HCC)   . Tremor, essential   . Hypercholesteremia   . Primary localized osteoarthritis of right knee   . GERD (gastroesophageal reflux disease)   . Depression   . Anxiety     Past Surgical History  Procedure Laterality Date  . Esophageal stretch    . Tonsillectomy    . 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      Subjective Assessment - 08/29/15 0852    Subjective pt reports he got up from the commode a few days ago, when he slightly twisted his ankle because his foot was asleep. he feels it is sore today. he has started PT at another clinic for his hip, but hasnt done the HEP yet, so his R hip is also hurting.    Pertinent History R TKA in June 2016   Diagnostic tests Xray    Patient Stated Goals improve balance and reduce hip pain when walking    Currently in Pain? Yes   Pain Score 3    Pain Location --  L ankle and R hip   Pain Descriptors / Indicators  Aching;Sore       Therex:   Nustep L 3 x 5 min no charge Leg press: 100 lbs 3x12 HR on leg press 100lbs x 12 Side stepping with red band x 4 laps in // bars. Cues to keep toes straight and eyes forward Fwd step up on 4inch step + AIREX on top- single UE support 2x10 each leg   NMR:  Toe taps on AIREX onto 4in step 2x20 1 foot on step 1 foot on AIREX with overhead 2000g med ball lift x 10 each leg 1 foot on step 1 foot on AIREX with trunk twist 2000g x 10 each leg. Pt has inc L hip pain.  Side step up/overs AIREX no UE x 10 Pt requires min verbal and tactile cues for proper exercise performance    pt requires CGA for safety on balance exercises                               PT Long Term Goals - 08/15/15 1007    PT LONG TERM GOAL #1   Title pt will improve berg balance scale to >46/56 to redce fall risk    Baseline 40/56  Time 6   Period Weeks   Status New   PT LONG TERM GOAL #2   Title pt will improve gait speed to 1.23m/s with LRAD to improve community mobility    Time 6   Period Weeks   Status New   PT LONG TERM GOAL #3   Title pt will perform 5x sit to stand in <15s demonstrating reduced fall risk.    Time 6   Period Weeks   Status New               Plan - 08/29/15 0857    Clinical Impression Statement pt did well with progression of strengthening exercise despite having a sore ankle. discussed importance of HEP compliance for progress in both his balance PT and for his hip pain. PT modifed treatement today to reduce strain on the L ankle due to his recent mild injury.    Rehab Potential Fair   Clinical Impairments Affecting Rehab Potential recent TKE, DM, HTN, Depression/anxiety.    PT Frequency 2x / week   PT Duration 6 weeks   PT Treatment/Interventions ADLs/Self Care Home Management;Patient/family education;Neuromuscular re-education;Balance training;Therapeutic exercise;Therapeutic activities;Functional mobility training;Stair  training;Gait training   PT Next Visit Plan progress strength and balance as tolerated.       Patient will benefit from skilled therapeutic intervention in order to improve the following deficits and impairments:  Abnormal gait, Decreased strength, Pain, Decreased endurance, Impaired sensation, Difficulty walking, Decreased balance  Visit Diagnosis: Unsteadiness on feet     Problem List Patient Active Problem List   Diagnosis Date Noted  . DJD (degenerative joint disease) of knee 11/15/2014  . Hypertension   . Diabetes mellitus type 2 in nonobese (HCC)   . Tremor, essential   . Primary localized osteoarthritis of right knee   . Anxiety and depression    Caryl Pina C. Nijah Tejera, PT, DPT [phone]  Andie Mortimer 08/29/2015, 9:27 AM  [place] MAIN [facility] SERVICES [address] [place], [place], [address] Phone: [phone]   Fax:  [phone]  Name: James Hood MRN: [id number] Date of Birth: 1940-06-09

## 2015-08-30 DIAGNOSIS — M5416 Radiculopathy, lumbar region: Secondary | ICD-10-CM | POA: Diagnosis not present

## 2015-08-30 DIAGNOSIS — M545 Low back pain: Secondary | ICD-10-CM | POA: Diagnosis not present

## 2015-08-31 ENCOUNTER — Ambulatory Visit: Payer: PPO

## 2015-08-31 DIAGNOSIS — R2681 Unsteadiness on feet: Secondary | ICD-10-CM | POA: Diagnosis not present

## 2015-08-31 NOTE — Therapy (Signed)
[place] MAIN [facility] SERVICES [address] [place], [place], [address] Phone: [phone]   Fax:  [phone]  Physical Therapy Treatment  Patient Details  Name: James Hood MRN: [id number] Date of Birth: Feb 16, 1941 Referring Provider: Melrose Nakayama  Encounter Date: 08/31/2015      PT End of Session - 08/31/15 1601    Visit Number 6   Number of Visits 13   Date for PT Re-Evaluation 09/12/15   Authorization Type 6/10   PT Start Time 1515   PT Stop Time 1600   PT Time Calculation (min) 45 min   Equipment Utilized During Treatment Gait belt   Activity Tolerance Patient tolerated treatment well   Behavior During Therapy [facility] for tasks assessed/performed      Past Medical History  Diagnosis Date  . Hypertension   . Anxiety and depression   . Neuropathic pain   . Diabetes mellitus type 2 in nonobese (HCC)   . Tremor, essential   . Hypercholesteremia   . Primary localized osteoarthritis of right knee   . GERD (gastroesophageal reflux disease)   . Depression   . Anxiety     Past Surgical History  Procedure Laterality Date  . Esophageal stretch    . Tonsillectomy    . 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      Subjective Assessment - 08/31/15 1601    Subjective pt reports his ankle is sore from his near fall sunday   Pertinent History R TKA in June 2016   Diagnostic tests Xray    Patient Stated Goals improve balance and reduce hip pain when walking    Currently in Pain? Yes   Pain Score 2    Pain Location Ankle   Pain Orientation Left   Pain Descriptors / Indicators Aching          therex: in fitness center: Octane trainer L6 x 5 min  Leg press: 7 plates 3x10 Hamstring curls 5  plates 3x10 Knee extension 2 plates 3x10  Pt requires min  verbal and tactile cues for proper exercise performance       NMR: side step over AIREX in // bars x 5 laps both fwd/ side Fwd step up/over AIREX x 10 each leg  Toe taps on AIREX onto 10in step 2x10 AIREX standing EO/EC 15s x 6  pt requires CGA for safety on balance exercises                             PT Long Term Goals - 08/31/15 1602    PT LONG TERM GOAL #1   Title pt will improve berg balance scale to >46/56 to redce fall risk    Baseline 40/56   Time 6   Period Weeks   Status On-going   PT LONG TERM GOAL #2   Title pt will improve gait speed to 1.92m/s with LRAD to improve community mobility    Time 6   Period Weeks   Status On-going   PT LONG TERM GOAL #3   Title pt will perform 5x sit to stand in <15s demonstrating reduced fall risk.    Time 6   Period Weeks   Status On-going  Plan - 08/31/15 1602    Clinical Impression Statement pt did well with progression of strength and balance training. his pain did not alter his session today. pt shows good motivation to exercise after PT   Rehab Potential Fair   Clinical Impairments Affecting Rehab Potential recent TKE, DM, HTN, Depression/anxiety.    PT Frequency 2x / week   PT Duration 6 weeks   PT Treatment/Interventions ADLs/Self Care Home Management;Patient/family education;Neuromuscular re-education;Balance training;Therapeutic exercise;Therapeutic activities;Functional mobility training;Stair training;Gait training   PT Next Visit Plan progress strength and balance as tolerated.       Patient will benefit from skilled therapeutic intervention in order to improve the following deficits and impairments:  Abnormal gait, Decreased strength, Pain, Decreased endurance, Impaired sensation, Difficulty walking, Decreased balance  Visit Diagnosis: Unsteadiness on feet     Problem List Patient Active Problem List   Diagnosis Date Noted  . DJD (degenerative joint disease) of knee  11/15/2014  . Hypertension   . Diabetes mellitus type 2 in nonobese (HCC)   . Tremor, essential   . Primary localized osteoarthritis of right knee   . Anxiety and depression    Caryl Pina C. Macon Sandiford, PT, DPT [phone]  Koryn Charlot 08/31/2015, 4:03 PM  [place] MAIN [facility] SERVICES [address] [place], [place], [address] Phone: [phone]   Fax:  [phone]  Name: James Hood MRN: [id number] Date of Birth: [birth year]

## 2015-09-01 DIAGNOSIS — M545 Low back pain: Secondary | ICD-10-CM | POA: Diagnosis not present

## 2015-09-01 DIAGNOSIS — M5416 Radiculopathy, lumbar region: Secondary | ICD-10-CM | POA: Diagnosis not present

## 2015-09-05 DIAGNOSIS — M25551 Pain in right hip: Secondary | ICD-10-CM | POA: Diagnosis not present

## 2015-09-06 ENCOUNTER — Ambulatory Visit: Payer: PPO

## 2015-09-06 DIAGNOSIS — R2681 Unsteadiness on feet: Secondary | ICD-10-CM | POA: Diagnosis not present

## 2015-09-06 NOTE — Therapy (Signed)
[place] MAIN [facility] SERVICES [address] [place], [place], [address] Phone: [phone]   Fax:  [phone]  Physical Therapy Treatment / Discharge summary  Patient Details  Name: James Hood MRN: [id number] Date of Birth: April 13, 1941 Referring Provider: Melrose Nakayama  Encounter Date: 09/06/2015      PT End of Session - 09/06/15 1015    Visit Number 7   Number of Visits 13   Date for PT Re-Evaluation 09/12/15   Authorization Type 7/10   PT Start Time 0945   PT Stop Time 1015   PT Time Calculation (min) 30 min   Equipment Utilized During Treatment Gait belt   Activity Tolerance Patient tolerated treatment well   Behavior During Therapy [facility] for tasks assessed/performed      Past Medical History  Diagnosis Date  . Hypertension   . Anxiety and depression   . Neuropathic pain   . Diabetes mellitus type 2 in nonobese (HCC)   . Tremor, essential   . Hypercholesteremia   . Primary localized osteoarthritis of right knee   . GERD (gastroesophageal reflux disease)   . Depression   . Anxiety     Past Surgical History  Procedure Laterality Date  . Esophageal stretch    . Tonsillectomy    . 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      Subjective Assessment - 09/06/15 0955    Subjective pt reports his hip pain is bothering him more than anything. he feels his balance is much better.    Pertinent History R TKA in June 2016   Diagnostic tests Xray    Patient Stated Goals improve balance and reduce hip pain when walking    Pain Score 8    Pain Location --  L hip      Therex: PT reassessed outcome measure and progress towards goals as follows       [facility] PT Assessment - 09/06/15 0001    Standardized Balance Assessment   Five times sit to stand comments   13.5   10 Meter Walk 1.24   Berg Balance Test   Sit to Stand Able to stand without using hands and stabilize independently   Standing Unsupported Able to stand safely 2 minutes   Sitting with Back Unsupported but Feet Supported on Floor or Stool Able to sit safely and securely 2 minutes   Stand to Sit Sits safely with minimal use of hands   Transfers Able to transfer safely, minor use of hands   Standing Unsupported with Eyes Closed Able to stand 10 seconds safely   Standing Ubsupported with Feet Together Able to place feet together independently and stand 1 minute safely   From Standing, Reach Forward with Outstretched Arm Can reach confidently >25 cm (10")   From Standing Position, Pick up Object from Floor Able to pick up shoe safely and easily   From Standing Position, Turn to Look Behind Over each Shoulder Looks behind from both sides and weight shifts well   Turn 360 Degrees Able to turn 360 degrees safely in 4 seconds or less   Standing Unsupported, Alternately Place Feet on Step/Stool Able to stand independently and safely and complete 8 steps in 20 seconds   Standing Unsupported, One Foot in Front Able to plae foot ahead of the  other independently and hold 30 seconds   Standing on One Leg Able to lift leg independently and hold equal to or more than 3 seconds   Total Score 53   Timed Up and Go Test   Normal TUG (seconds) 8.25                             PT Education - 28-Sep-2015 1015    Education provided Yes   Education Details met PT goals, progress   Person(s) Educated Patient   Methods Explanation   Comprehension Verbalized understanding             PT Long Term Goals - 09-28-2015 1017    PT LONG TERM GOAL #1   Title pt will improve berg balance scale to >46/56 to redce fall risk    Baseline 40/56   Time 6   Period Weeks   Status Achieved   PT LONG TERM GOAL #2   Title pt will improve gait speed to 1.27ms with LRAD to improve community  mobility    Time 6   Period Weeks   Status Achieved   PT LONG TERM GOAL #3   Title pt will perform 5x sit to stand in <15s demonstrating reduced fall risk.    Time 6   Period Weeks   Status Achieved               Plan - [id number]    Clinical Impression Statement pt has achieved PT goals at this time and has made outstanding progress with his mobility and balance. pt is no longer a high fall risk and will be DC from PT. pt is continuing to have L hip / leg pain which was limiting him in PT sessions which would benefit from further medical evaluation.    Rehab Potential Fair   Clinical Impairments Affecting Rehab Potential recent TKE, DM, HTN, Depression/anxiety.    PT Frequency 2x / week   PT Duration 6 weeks   PT Treatment/Interventions ADLs/Self Care Home Management;Patient/family education;Neuromuscular re-education;Balance training;Therapeutic exercise;Therapeutic activities;Functional mobility training;Stair training;Gait training      Patient will benefit from skilled therapeutic intervention in order to improve the following deficits and impairments:  Abnormal gait, Decreased strength, Pain, Decreased endurance, Impaired sensation, Difficulty walking, Decreased balance  Visit Diagnosis: Unsteadiness on feet       G-Codes - [id number]    Functional Assessment Tool Used berg/121mlk/5xsittostand   Functional Limitation Mobility: Walking and moving around   Mobility: Walking and Moving Around Current Status ([phone] least 1 percent but less than 20 percent impaired, limited or restricted   Mobility: Walking and Moving Around Goal Status ([phone] least 1 percent but less than 20 percent impaired, limited or restricted   Mobility: Walking and Moving Around Discharge Status ([phone] least 1 percent but less than 20 percent impaired, limited or restricted      Problem List Patient Active Problem List   Diagnosis Date Noted  . DJD (degenerative joint  disease) of knee 11/15/2014  . Hypertension   . Diabetes mellitus type 2 in nonobese (HCC)   . Tremor, essential   . Primary localized osteoarthritis of right knee   . Anxiety and depression    AsCaryl Pina. Wren Gallaga, PT, DPT #[phone],Linn Clavin 08/2015/09/1008:17 AM  [facility] [address]: [phone] Fax:  [phone]  Name: James Hood MRN: [id number] Date of Birth: [birth year]

## 2015-09-07 DIAGNOSIS — E119 Type 2 diabetes mellitus without complications: Secondary | ICD-10-CM | POA: Diagnosis not present

## 2015-09-09 ENCOUNTER — Ambulatory Visit (INDEPENDENT_AMBULATORY_CARE_PROVIDER_SITE_OTHER): Payer: PPO | Admitting: Family Medicine

## 2015-09-09 ENCOUNTER — Encounter: Payer: Self-pay | Admitting: Family Medicine

## 2015-09-09 VITALS — BP 110/68 | HR 72 | Temp 98.3°F | Ht 69.5 in | Wt 190.8 lb

## 2015-09-09 DIAGNOSIS — M7062 Trochanteric bursitis, left hip: Secondary | ICD-10-CM | POA: Diagnosis not present

## 2015-09-09 DIAGNOSIS — F418 Other specified anxiety disorders: Secondary | ICD-10-CM

## 2015-09-09 DIAGNOSIS — E119 Type 2 diabetes mellitus without complications: Secondary | ICD-10-CM | POA: Diagnosis not present

## 2015-09-09 DIAGNOSIS — F419 Anxiety disorder, unspecified: Secondary | ICD-10-CM

## 2015-09-09 DIAGNOSIS — R51 Headache: Secondary | ICD-10-CM

## 2015-09-09 DIAGNOSIS — F329 Major depressive disorder, single episode, unspecified: Secondary | ICD-10-CM

## 2015-09-09 DIAGNOSIS — G44209 Tension-type headache, unspecified, not intractable: Secondary | ICD-10-CM

## 2015-09-09 DIAGNOSIS — Z130 Encounter for screening for diseases of the blood and blood-forming organs and certain disorders involving the immune mechanism: Secondary | ICD-10-CM | POA: Diagnosis not present

## 2015-09-09 DIAGNOSIS — R519 Headache, unspecified: Secondary | ICD-10-CM | POA: Insufficient documentation

## 2015-09-09 DIAGNOSIS — F32A Depression, unspecified: Secondary | ICD-10-CM

## 2015-09-09 HISTORY — DX: Headache, unspecified: R51.9

## 2015-09-09 NOTE — Assessment & Plan Note (Signed)
Followed by orthopedics. Benign exam. Neurologically intact. He'll continue to follow with orthopedics for this.

## 2015-09-09 NOTE — Progress Notes (Signed)
Pre visit review using our clinic review tool, if applicable. No additional management support is needed unless otherwise documented below in the visit note. 

## 2015-09-09 NOTE — Assessment & Plan Note (Signed)
Stable. No SI or HI. Continue Wellbutrin and Depakote.

## 2015-09-09 NOTE — Assessment & Plan Note (Signed)
Long history of tension headaches. Unchanged recently. Neurologically intact. He will continue to monitor. Given return precautions.

## 2015-09-09 NOTE — Patient Instructions (Signed)
Nice to meet you. We are going to order lab work to come back some morning fasting to obtain. Please continue your current medications as you have been If you develop worsening headaches, numbness, weakness, vision changes, worsening depression or anxiety, thoughts of harming herself or others, or any new or changing symptoms please seek medical attention.

## 2015-09-09 NOTE — Progress Notes (Signed)
Patient ID: James Hood, male   DOB: Mar 02, 1941, 75 y.o.   MRN: [id number]  Tommi Rumps, MD Phone: [phone]  James Hood is a 75 y.o. male who presents today for new patient visit  DIABETES Disease Monitoring: Blood Sugar ranges-does not check consistently, typically 80-150 Polyuria/phagia/dipsia- some polyuria and polydipsia      ophthalmology- has seen in the last month Medications: Compliance- taking metformin and glimepiride   Tension headaches: Patient notes long history of tension headaches throughout his life. Bitemporal and right sided in the back of his head headaches. Uses an ice pack on his neck. No numbness, weakness, or vision changes. It is unclear what medicines he specifically takes for headaches.  Depression/anxiety: Patient notes long history of this. Not as bad now. 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 He notes occasionally he will snap at certain situations. He thinks he takes Depakote and bupropion for this. No SI or HI.  Patient is followed by orthopedics for hip bursitis. Recently got a shot in his left trochanteric bursa. Notes some persistent pain he is scheduled for an MRI to evaluate his lumbar spine for a pinched nerve.   Active Ambulatory Problems    Diagnosis Date Noted  . Hypertension   . Diabetes mellitus type 2 in nonobese (HCC)   . Tremor, essential   . Primary localized osteoarthritis of right knee   . Anxiety and depression   . DJD (degenerative joint disease) of knee 11/15/2014  . Trochanteric bursitis of left hip 09/09/2015  . Headache 09/09/2015   Resolved Ambulatory Problems    Diagnosis Date Noted  . No Resolved Ambulatory Problems   Past Medical History  Diagnosis Date  . Neuropathic pain   . Hypercholesteremia   . GERD (gastroesophageal reflux disease)   . Depression   . Anxiety   . 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 Arthritis   . Colon polyps     Family History  Problem Relation Age of Onset    . Heart attack Mother   . Hypertension Father     Social History   Social History  . Marital Status: Married    Spouse Name: N/A  . Number of Children: N/A  . Years of Education: N/A   Occupational History  . Not on file.   Social History Main Topics  . Smoking status: Current Every Day Smoker -- 1.00 packs/day for 60 years    Types: Cigarettes  . Smokeless tobacco: Not on file  . 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 Drug Use: No  . Sexual Activity: Not Currently   Other Topics Concern  . Not on file   Social History Narrative    ROS  General:  Negative for nexplained weight loss, fever Skin: Negative for new or changing mole, sore that won't heal HEENT: Negative for trouble hearing, trouble seeing, ringing in ears, mouth sores, hoarseness, change in voice, dysphagia. CV:  Negative for chest pain, dyspnea, edema, palpitations Resp: Negative for cough, dyspnea, hemoptysis GI: Positive for diarrhea, Negative for nausea, vomiting, constipation, abdominal pain, melena, hematochezia. GU: Positive for frequent urination, urinary hesitancy, excessive thirst, Negative for dysuria, hematuria, vaginal or penile discharge, polyuria, sexual difficulty, lumps in testicle or breasts MSK: Positive for muscle cramps or aches, negative for joint pain or swelling Neuro: Negative for headaches, weakness, numbness, dizziness, passing out/fainting Psych: Positive for for depression, anxiety, memory problems  Objective  Physical  Exam Filed Vitals:   09/09/15 1431  BP: 110/68  Pulse: 72  Temp: 98.3 F (36.8 C)    BP Readings from Last 3 Encounters:  09/09/15 110/68  11/17/14 115/70  11/05/14 124/67   Wt Readings from Last 3 Encounters:  09/09/15 190 lb 12.8 oz (86.546 kg)  05/27/15 198 lb (89.812 kg)  11/15/14 198 lb (89.812 kg)    Physical Exam  Constitutional: He is well-developed, well-nourished, and in no distress.  HENT:  Head:  Normocephalic and atraumatic.  Right Ear: External ear normal.  Left Ear: External ear normal.  Mouth/Throat: Oropharynx is clear and moist. No oropharyngeal exudate.  Eyes: Conjunctivae are normal. Pupils are equal, round, and reactive to light.  Neck: Neck supple.  Cardiovascular: Normal rate, regular rhythm and normal heart sounds.   Pulmonary/Chest: Effort normal and breath sounds normal.  Abdominal: Soft. Bowel sounds are normal. He exhibits no distension. There is no tenderness. There is no rebound and no guarding.  Musculoskeletal: He exhibits no edema.  No midline spine tenderness, no midline spine step-off, no muscular back tenderness, no tenderness of bilateral trochanteric bursa  Lymphadenopathy:    He has no cervical adenopathy.  Neurological: He is alert.  CN 2-12 intact, 5/5 strength in bilateral biceps, triceps, grip, quads, hamstrings, plantar and dorsiflexion, sensation to light touch intact in bilateral UE and LE, normal gait, 2+ patellar reflexes  Skin: Skin is warm and dry. He is not diaphoretic.  Psychiatric:  Mood depressed, affect normal     Assessment/Plan:   Anxiety and depression Stable. No SI or HI. Continue Wellbutrin and Depakote.  Diabetes mellitus type 2 in nonobese Patient reports last A1c was 6.3. He does note polyuria and polydipsia. States has been a number of months since his last A1c. We will repeat an A1c with fasting labs. Labs listed below. Continue current medications for diabetes.  Trochanteric bursitis of left hip Followed by orthopedics. Benign exam. Neurologically intact. He'll continue to follow with orthopedics for this.  Headache Long history of tension headaches. Unchanged recently. Neurologically intact. He will continue to monitor. Given return precautions.    Orders Placed This Encounter  Procedures  . Comp Met (CMET)    Standing Status: Future     Number of Occurrences:      Standing Expiration Date: 09/08/2016  . HgB A1c      Standing Status: Future     Number of Occurrences:      Standing Expiration Date: 09/08/2016  . Lipid Profile    Standing Status: Future     Number of Occurrences:      Standing Expiration Date: 09/08/2016  . CBC    Standing Status: Future     Number of Occurrences:      Standing Expiration Date: 09/08/2016    No orders of the defined types were placed in this encounter.     Tommi Rumps, MD [place]

## 2015-09-09 NOTE — Assessment & Plan Note (Signed)
Patient reports last A1c was 6.3. He does note polyuria and polydipsia. States has been a number of months since his last A1c. We will repeat an A1c with fasting labs. Labs listed below. Continue current medications for diabetes.

## 2015-09-16 ENCOUNTER — Ambulatory Visit (INDEPENDENT_AMBULATORY_CARE_PROVIDER_SITE_OTHER): Payer: PPO | Admitting: Family Medicine

## 2015-09-16 ENCOUNTER — Encounter: Payer: Self-pay | Admitting: Family Medicine

## 2015-09-16 VITALS — BP 108/62 | HR 83 | Temp 97.8°F | Ht 69.5 in | Wt 192.8 lb

## 2015-09-16 DIAGNOSIS — Z0001 Encounter for general adult medical examination with abnormal findings: Secondary | ICD-10-CM

## 2015-09-16 DIAGNOSIS — Z130 Encounter for screening for diseases of the blood and blood-forming organs and certain disorders involving the immune mechanism: Secondary | ICD-10-CM

## 2015-09-16 DIAGNOSIS — E119 Type 2 diabetes mellitus without complications: Secondary | ICD-10-CM

## 2015-09-16 DIAGNOSIS — N4281 Prostatodynia syndrome: Secondary | ICD-10-CM | POA: Diagnosis not present

## 2015-09-16 DIAGNOSIS — M545 Low back pain, unspecified: Secondary | ICD-10-CM | POA: Insufficient documentation

## 2015-09-16 DIAGNOSIS — Z79899 Other long term (current) drug therapy: Secondary | ICD-10-CM | POA: Insufficient documentation

## 2015-09-16 DIAGNOSIS — R296 Repeated falls: Secondary | ICD-10-CM | POA: Insufficient documentation

## 2015-09-16 DIAGNOSIS — G8929 Other chronic pain: Secondary | ICD-10-CM

## 2015-09-16 DIAGNOSIS — W19XXXA Unspecified fall, initial encounter: Secondary | ICD-10-CM

## 2015-09-16 DIAGNOSIS — K2270 Barrett's esophagus without dysplasia: Secondary | ICD-10-CM | POA: Insufficient documentation

## 2015-09-16 DIAGNOSIS — Z23 Encounter for immunization: Secondary | ICD-10-CM

## 2015-09-16 DIAGNOSIS — R6889 Other general symptoms and signs: Secondary | ICD-10-CM

## 2015-09-16 DIAGNOSIS — N4289 Other specified disorders of prostate: Secondary | ICD-10-CM

## 2015-09-16 DIAGNOSIS — N400 Enlarged prostate without lower urinary tract symptoms: Secondary | ICD-10-CM | POA: Insufficient documentation

## 2015-09-16 DIAGNOSIS — K22719 Barrett's esophagus with dysplasia, unspecified: Secondary | ICD-10-CM

## 2015-09-16 LAB — CBC
HCT: 32.2 % — ABNORMAL LOW (ref 39.0–52.0)
Hemoglobin: 10.8 g/dL — ABNORMAL LOW (ref 13.0–17.0)
MCHC: 33.5 g/dL (ref 30.0–36.0)
MCV: 91 fl (ref 78.0–100.0)
Platelets: 293 10*3/uL (ref 150.0–400.0)
RBC: 3.53 Mil/uL — ABNORMAL LOW (ref 4.22–5.81)
RDW: 13.8 % (ref 11.5–15.5)
WBC: 6.4 10*3/uL (ref 4.0–10.5)

## 2015-09-16 LAB — COMPREHENSIVE METABOLIC PANEL
ALT: 15 U/L (ref 0–53)
AST: 19 U/L (ref 0–37)
Albumin: 4.2 g/dL (ref 3.5–5.2)
Alkaline Phosphatase: 39 U/L (ref 39–117)
BUN: 36 mg/dL — ABNORMAL HIGH (ref 6–23)
CO2: 28 mEq/L (ref 19–32)
Calcium: 9.7 mg/dL (ref 8.4–10.5)
Chloride: 102 mEq/L (ref 96–112)
Creatinine, Ser: 1.27 mg/dL (ref 0.40–1.50)
GFR: 58.66 mL/min — ABNORMAL LOW (ref >60.00)
Glucose, Bld: 114 mg/dL — ABNORMAL HIGH (ref 70–99)
Potassium: 4.3 mEq/L (ref 3.5–5.1)
Sodium: 138 mEq/L (ref 135–145)
Total Bilirubin: 0.2 mg/dL (ref 0.2–1.2)
Total Protein: 6.3 g/dL (ref 6.0–8.3)

## 2015-09-16 LAB — LIPID PANEL
Cholesterol: 161 mg/dL (ref 0–200)
HDL: 38.5 mg/dL — ABNORMAL LOW (ref >39.00)
NonHDL: 122.09
Total CHOL/HDL Ratio: 4
Triglycerides: 393 mg/dL — ABNORMAL HIGH (ref 0.0–149.0)
VLDL: 78.6 mg/dL — ABNORMAL HIGH (ref 0.0–40.0)

## 2015-09-16 LAB — PSA: PSA: 0.07 ng/mL — ABNORMAL LOW (ref 0.10–4.00)

## 2015-09-16 LAB — LDL CHOLESTEROL, DIRECT: Direct LDL: 66 mg/dL

## 2015-09-16 LAB — HEMOGLOBIN A1C: Hgb A1c MFr Bld: 6.8 % — ABNORMAL HIGH (ref 4.6–6.5)

## 2015-09-16 NOTE — Progress Notes (Signed)
Patient ID: James Hood, male   DOB: [birth year], 75 y.o.   MRN: [id number]  Tommi Rumps, MD Phone: [phone]  James Hood is a 75 y.o. male who presents today for physical exam.  Falls: Patient notes in the last year he has fallen a number of times. Last fall was greater than 6 months ago. He has been seen by his neurologist for this and they sent him to physical therapy. He was previously on Klonopin and this made it difficult for him to get out of bed at night and move his legs, though he is no longer on this and has not had any issues with falls since coming off this medication. No falls in the last 6 months.  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 He's been evaluated by GI multiple times for this and has been seen at [facility] for this as well. Had a procedure in the past to burn the tissue. Notes infrequent issues with swallowing at this time. Occasionally will swallow some pills and they will feel as though they get stuck. He will have to drink water to get them down. Next follow-up is in 6 months.  Chronic left low back pain: Followed by orthopedics. They're planning an MRI next week. He notes shooting pain down the back of his left leg with this. No numbness or weakness. No saddle anesthesia. No fevers. He notes in the past he has had chronic issues with bowel and urine urgency. He has been treated by GI for this. No recent bowel urgency. Occasionally will have some urinary urge incontinence.   Active Ambulatory Problems    Diagnosis Date Noted  . Hypertension   . Diabetes mellitus type 2 in nonobese (HCC)   . Tremor, essential   . Primary localized osteoarthritis of right knee   . Anxiety and depression   . DJD (degenerative joint disease) of knee 11/15/2014  . Trochanteric bursitis of left hip 09/09/2015  . Headache 09/09/2015  . Falls 09/16/2015  . Barrett's esophagus 09/16/2015  . Chronic lumbar pain 09/16/2015  . Prostate asymmetry 09/16/2015     Resolved Ambulatory Problems    Diagnosis Date Noted  . Encounter for general adult medical examination with abnormal findings 09/16/2015   Past Medical History  Diagnosis Date  . Neuropathic pain   . Hypercholesteremia   . GERD (gastroesophageal reflux disease)   . Depression   . Anxiety   . 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 Arthritis   . Colon polyps     Family History  Problem Relation Age of Onset  . Heart attack Mother   . Hypertension Father     Social History   Social History  . Marital Status: Married    Spouse Name: N/A  . Number of Children: N/A  . Years of Education: N/A   Occupational History  . Not on file.   Social History Main Topics  . Smoking status: Current Every Day Smoker -- 1.00 packs/day for 60 years    Types: Cigarettes  . Smokeless tobacco: Not on file  . 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 Drug Use: No  . Sexual Activity: Not Currently   Other Topics Concern  . Not on file   Social History Narrative    ROS positive findings below that were not discussed above are reportedly stable from his last visit and were previously discussed.  General:  Negative for nexplained weight loss, fever  Skin: Negative for new or changing mole, sore that won't heal HEENT: Positive for trouble swallowing, Negative for trouble hearing, trouble seeing, ringing in ears, mouth sores, hoarseness, change in voice CV:  Negative for chest pain, dyspnea, edema, palpitations Resp: Negative for cough, dyspnea, hemoptysis GI: Positive for diarrhea, Negative for nausea, vomiting, constipation, abdominal pain, melena, hematochezia. GU: Positive for urinary incontinence, frequent urination, and sexual difficulty, Negative for dysuria, urinary hesitance, hematuria, vaginal or penile discharge, polyuria, lumps in testicle or breasts MSK: Negative for muscle cramps or aches, joint pain or swelling Neuro: Negative for headaches, weakness,  numbness, dizziness, passing out/fainting Psych: Positive for depression, anxiety, memory problems  Objective  Physical Exam Filed Vitals:   09/16/15 0820  BP: 108/62  Pulse: 83  Temp: 97.8 F (36.6 C)    BP Readings from Last 3 Encounters:  09/16/15 108/62  09/09/15 110/68  11/17/14 115/70   Wt Readings from Last 3 Encounters:  09/16/15 192 lb 12.8 oz (87.454 kg)  09/09/15 190 lb 12.8 oz (86.546 kg)  05/27/15 198 lb (89.812 kg)    Physical Exam  Constitutional: He is well-developed, well-nourished, and in no distress.  HENT:  Head: Normocephalic and atraumatic.  Right Ear: External ear normal.  Left Ear: External ear normal.  Mouth/Throat: Oropharynx is clear and moist. No oropharyngeal exudate.  Eyes: Conjunctivae are normal. Pupils are equal, round, and reactive to light.  Neck: Neck supple.  Cardiovascular: Normal rate, regular rhythm and normal heart sounds.   Pulmonary/Chest: Effort normal and breath sounds normal.  Abdominal: Soft. Bowel sounds are normal. He exhibits no distension. There is no tenderness. There is no rebound and no guarding.  Genitourinary:  Normal rectal tone, prostate asymmetric with right side larger than the left  Musculoskeletal:  No midline spine tenderness, no midline spine step-off, no muscular back tenderness  Lymphadenopathy:    He has no cervical adenopathy.  Neurological: He is alert.  CN 2-12 intact, 5/5 strength in bilateral biceps, triceps, grip, quads, hamstrings, plantar and dorsiflexion, sensation to light touch intact in bilateral UE and LE, normal gait, 2+ patellar reflexes  Skin: Skin is warm and dry. He is not diaphoretic.     Assessment/Plan:   Falls Patient multiple falls in the last year though none in the last 6 months. Suspect this was likely related to him taking Klonopin. Has not been an issue since coming off this medicine. He has also seen  physical therapy. He will continue to monitor this.  Barrett's  esophagus Followed by GI for this. Some intermittent trouble swallowing though none frequently. He will continue to follow with GI for this.  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 They've an MRI scheduled for next week. He notes chronic issues with bowels that is followed by GI. Some urinary urgency as well that is chronic. No acute changes and these issues. Normal rectal tone. No saddle anesthesia. He'll proceed with MRI next week and continue to follow-up with orthopedics for this. In return precautions.  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 We will refer to urology for evaluation. We'll check a PSA.    Orders Placed This Encounter  Procedures  . Tdap vaccine greater than or equal to 7yo IM  . Varicella-zoster vaccine subcutaneous  . PSA  . Ambulatory referral to Urology    Referral Priority:  Routine    Referral Type:  Consultation    Referral Reason:  Specialty Services Required    Requested  Specialty:  Urology    Number of Visits Requested:  1   Health Maintenance: Patient given Zostavax and Tdap today. He is not due for second pneumonia vaccine. Colonoscopy is up-to-date. Labs were previously ordered and we will add a PSA.  Tommi Rumps, MD [place]

## 2015-09-16 NOTE — Assessment & Plan Note (Signed)
Followed by GI for this. Some intermittent trouble swallowing though none frequently. He will continue to follow with GI for this.

## 2015-09-16 NOTE — Assessment & Plan Note (Signed)
Chronic low back pain with left-sided sciatica followed by orthopedics. They've an MRI scheduled for next week. He notes chronic issues with bowels that is followed by GI. Some urinary urgency as well that is chronic. No acute changes and these issues. Normal rectal tone. No saddle anesthesia. He'll proceed with MRI next week and continue to follow-up with orthopedics for this. In return precautions.

## 2015-09-16 NOTE — Patient Instructions (Signed)
Nice to see you. We are going to refer you to urology for your prostate. We will obtain lab work today and call you with the results. Please keep your follow-ups with GI and orthopedics. If you develop numbness, weakness, change in bowel or bladder function, numbness between her legs, issues with falling, it inability to swallow, pain with swallowing, or any new or changing symptoms please seek medical attention.

## 2015-09-16 NOTE — Progress Notes (Signed)
Pre visit review using our clinic review tool, if applicable. No additional management support is needed unless otherwise documented below in the visit note. 

## 2015-09-16 NOTE — Assessment & Plan Note (Signed)
Patient multiple falls in the last year though none in the last 6 months. Suspect this was likely related to him taking Klonopin. Has not been an issue since coming off this medicine. He has also seen  physical therapy. He will continue to monitor this.

## 2015-09-16 NOTE — Assessment & Plan Note (Signed)
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 We will refer to urology for evaluation. We'll check a PSA.

## 2015-09-19 ENCOUNTER — Telehealth: Payer: Self-pay | Admitting: Family Medicine

## 2015-09-19 ENCOUNTER — Other Ambulatory Visit: Payer: Self-pay | Admitting: Family Medicine

## 2015-09-19 DIAGNOSIS — M545 Low back pain: Secondary | ICD-10-CM | POA: Diagnosis not present

## 2015-09-19 DIAGNOSIS — M5416 Radiculopathy, lumbar region: Secondary | ICD-10-CM | POA: Diagnosis not present

## 2015-09-19 NOTE — Telephone Encounter (Signed)
Called but was unable to leave voicemail

## 2015-09-19 NOTE — Telephone Encounter (Signed)
This appears to be a prescription request. Per our medication list it does not appear that this has been prescribed for him previously. Patient really should not be taking his wife's medication. He has been seeing orthopedics for his pain and had an injection by them at some point in the past month or so. Has he checked with them regarding medication as they have been managing this?

## 2015-09-19 NOTE — Telephone Encounter (Signed)
Refill request sent to Dr.Sonnenberg.

## 2015-09-19 NOTE — Telephone Encounter (Signed)
Historical medication. Telephone note sent to you and jamie on how patients feeling. Please advise

## 2015-09-19 NOTE — Telephone Encounter (Signed)
Pt called about needing a Rx for Tramadol. Pt has been taking his wife medication for pain that he has been having from Hip all the way to his feet. Pt states it has been dulling the pain. Pharmacy is [facility], [place]. Call pt @ [phone]. Thank you!

## 2015-09-20 NOTE — Telephone Encounter (Signed)
Patient was told to contact orthopedics for the pain medication. Patient states that is pain is not being managed very well.

## 2015-09-21 MED ORDER — TRAZODONE HCL 150 MG PO TABS: 150.0000 mg | ORAL_TABLET | Freq: Every day | ORAL | Status: DC

## 2015-09-21 NOTE — Telephone Encounter (Signed)
Refill given

## 2015-09-22 ENCOUNTER — Telehealth: Payer: Self-pay | Admitting: *Deleted

## 2015-09-22 DIAGNOSIS — M545 Low back pain: Secondary | ICD-10-CM | POA: Diagnosis not present

## 2015-09-22 NOTE — Telephone Encounter (Signed)
Called patient. Gave lab results. Patient verbalized understanding. Patient is willing to increase Simvastatin.

## 2015-09-22 NOTE — Telephone Encounter (Signed)
Patient is returning a phone call in reference to his lab results on 04/28. Pt contact [phone]

## 2015-09-26 DIAGNOSIS — M545 Low back pain: Secondary | ICD-10-CM | POA: Diagnosis not present

## 2015-09-26 DIAGNOSIS — M5416 Radiculopathy, lumbar region: Secondary | ICD-10-CM | POA: Diagnosis not present

## 2015-09-27 DIAGNOSIS — M5116 Intervertebral disc disorders with radiculopathy, lumbar region: Secondary | ICD-10-CM | POA: Diagnosis not present

## 2015-09-27 DIAGNOSIS — M545 Low back pain: Secondary | ICD-10-CM | POA: Diagnosis not present

## 2015-09-27 DIAGNOSIS — M5416 Radiculopathy, lumbar region: Secondary | ICD-10-CM | POA: Diagnosis not present

## 2015-09-27 MED ORDER — SIMVASTATIN 40 MG PO TABS: 40.0000 mg | ORAL_TABLET | Freq: Every day | ORAL | Status: DC

## 2015-09-27 NOTE — Telephone Encounter (Signed)
Increased dose sent to pharmacy

## 2015-09-28 DIAGNOSIS — R262 Difficulty in walking, not elsewhere classified: Secondary | ICD-10-CM | POA: Diagnosis not present

## 2015-09-28 DIAGNOSIS — R413 Other amnesia: Secondary | ICD-10-CM | POA: Diagnosis not present

## 2015-09-28 DIAGNOSIS — R200 Anesthesia of skin: Secondary | ICD-10-CM | POA: Diagnosis not present

## 2015-09-28 DIAGNOSIS — R251 Tremor, unspecified: Secondary | ICD-10-CM | POA: Diagnosis not present

## 2015-10-02 DIAGNOSIS — R251 Tremor, unspecified: Secondary | ICD-10-CM | POA: Insufficient documentation

## 2015-10-07 ENCOUNTER — Encounter: Payer: Self-pay | Admitting: Urology

## 2015-10-07 ENCOUNTER — Ambulatory Visit (INDEPENDENT_AMBULATORY_CARE_PROVIDER_SITE_OTHER): Payer: PPO | Admitting: Urology

## 2015-10-07 VITALS — BP 108/67 | HR 62 | Ht 71.0 in | Wt 194.8 lb

## 2015-10-07 DIAGNOSIS — M545 Low back pain: Secondary | ICD-10-CM | POA: Diagnosis not present

## 2015-10-07 DIAGNOSIS — N400 Enlarged prostate without lower urinary tract symptoms: Secondary | ICD-10-CM | POA: Diagnosis not present

## 2015-10-07 DIAGNOSIS — M5116 Intervertebral disc disorders with radiculopathy, lumbar region: Secondary | ICD-10-CM | POA: Diagnosis not present

## 2015-10-07 DIAGNOSIS — M5416 Radiculopathy, lumbar region: Secondary | ICD-10-CM | POA: Diagnosis not present

## 2015-10-07 LAB — URINALYSIS, COMPLETE
Bilirubin, UA: NEGATIVE
Glucose, UA: NEGATIVE
Ketones, UA: NEGATIVE
Leukocytes, UA: NEGATIVE
Nitrite, UA: NEGATIVE
Protein, UA: NEGATIVE
RBC, UA: NEGATIVE
Specific Gravity, UA: 1.02 (ref 1.005–1.030)
Urobilinogen, Ur: 0.2 mg/dL (ref 0.2–1.0)
pH, UA: 5.5 (ref 5.0–7.5)

## 2015-10-07 LAB — MICROSCOPIC EXAMINATION
Bacteria, UA: NONE SEEN
WBC, UA: NONE SEEN /hpf (ref 0–?)

## 2015-10-07 LAB — BLADDER SCAN AMB NON-IMAGING: Scan Result: 120

## 2015-10-07 MED ORDER — FINASTERIDE 5 MG PO TABS: 5.0000 mg | ORAL_TABLET | Freq: Every day | ORAL | Status: DC

## 2015-10-07 NOTE — Progress Notes (Signed)
10/07/2015 12:19 PM   James Hood [birth year] [id number]  Referring provider: Leone Haven, MD [place], [place] [address]  Chief Complaint  Patient presents with  . Benign Prostatic Hypertrophy    New Patient    HPI: The patient is a 75 year old gentleman with a past medical history of BPH on Flomax who presents for evaluation of prostate asymmetry. The patient has an IPS score of 15/5. His for his frequency and intermittency carriers. He also has reason incomplete emptying and weak stream categories. He has nocturia 1. He is very unhappy with his symptoms. He was told at a visit with his primary care doctor that his prostate may be asymmetric.  PVR: 120 cc PSA: 0.07 (May 2017)    PMH: Past Medical History  Diagnosis Date  . Hypertension   . Anxiety and depression   . Neuropathic pain   . Diabetes mellitus type 2 in nonobese (HCC)   . Tremor, essential   . Hypercholesteremia   . Primary localized osteoarthritis of right knee   . GERD (gastroesophageal reflux disease)   . Depression   . Anxiety   . 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 Arthritis   . Headache   . Colon polyps   . Hyperthyroidism   . Stomach ulcer     Surgical History: Past Surgical History  Procedure Laterality Date  . Esophageal stretch    . Tonsillectomy    . 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  Always use your most recent med list.               aspirin EC 81 MG tablet  Take by mouth.     buPROPion 150 MG 12 hr tablet  Commonly known as:  ZYBAN  Take 150 mg by mouth 2 (two) times daily.     clorazepate 3.75 MG tablet  Commonly known as:  TRANXENE  Take by mouth.     colestipol 1 g tablet  Commonly known as:  COLESTID     cyclobenzaprine 10 MG tablet  Commonly known as:  FLEXERIL  Take 10 mg by mouth 3 (three) times daily as needed for muscle spasms.     diphenoxylate-atropine 2.5-0.025 MG tablet  Commonly known as:  LOMOTIL     divalproex 250 MG 24 hr tablet  Commonly known as:  DEPAKOTE ER  Take 250-500 mg by mouth See admin instructions. Pt takes 500mg  in the morning, and 250mg  in the evening     donepezil 10 MG tablet  Commonly known as:  ARICEPT  Take 10 mg by mouth at bedtime.     fenofibrate 145 MG tablet  Commonly known as:  TRICOR  Take 145 mg by mouth daily.     finasteride 5 MG tablet  Commonly known as:  PROSCAR  Take 1 tablet (5 mg total) by mouth daily.     gabapentin 300 MG capsule  Commonly known as:  NEURONTIN  Take 900 mg by mouth 2 (two) times daily.     glimepiride 4 MG tablet  Commonly known as:  AMARYL  Take 4 mg by mouth 2 (two) times daily.     ibuprofen 200 MG tablet  Commonly known as:  ADVIL,MOTRIN  Take by mouth.     lisinopril-hydrochlorothiazide 20-25 MG tablet  Commonly known as:  PRINZIDE,ZESTORETIC  Take 1 tablet by mouth 2 (two) times daily.     memantine 5 MG tablet  Commonly known as:  NAMENDA  Take 5 mg by mouth 2 (two) times daily.     metFORMIN 500 MG tablet  Commonly known as:  GLUCOPHAGE  Take 1,000 mg by mouth 2 (two) times daily with a meal.     metoprolol tartrate 25 MG tablet  Commonly known as:  LOPRESSOR  Take 25 mg by mouth 2 (two) times daily.     omeprazole 20 MG tablet  Commonly known as:  PRILOSEC OTC  Take 20 mg by mouth 2 (two) times daily.     primidone 50 MG tablet  Commonly known as:  MYSOLINE  Take 150-200 mg by mouth See admin instructions. Pt takes 200mg  in the morning, and 150mg  in the evening     propranolol 60 MG tablet  Commonly known as:  INDERAL  Take 60 mg by mouth 3 (three) times daily.     QUEtiapine 25 MG tablet  Commonly known as:  SEROQUEL     simvastatin 40 MG tablet  Commonly known as:  ZOCOR    Take 1 tablet (40 mg total) by mouth daily.     tamsulosin 0.4 MG Caps capsule  Commonly known as:  FLOMAX        Allergies: No Known Allergies  Family History: Family History  Problem Relation Age of Onset  . Heart attack Mother   . Hypertension Father   . Kidney cancer Neg Hx   . Prostate cancer Neg Hx     Social History:  reports that he has been smoking Cigarettes.  He has a 60 pack-year smoking history. He does not have any smokeless tobacco history on file. He reports that he does not drink alcohol or use illicit drugs.  ROS: UROLOGY Frequent Urination?: Yes Hard to postpone urination?: Yes Burning/pain with urination?: No Get up at night to urinate?: Yes Leakage of urine?: Yes Urine stream starts and stops?: Yes Trouble starting stream?: No Do you have to strain to urinate?: No Blood in urine?: No Urinary tract infection?: No Sexually transmitted disease?: No Injury to kidneys or bladder?: No Painful intercourse?: No Weak stream?: Yes Erection problems?: Yes Penile pain?: No  Gastrointestinal Nausea?: No Vomiting?: No Indigestion/heartburn?: No Diarrhea?: Yes Constipation?: Yes  Constitutional Fever: No Night sweats?: No Weight loss?: No Fatigue?: No  Skin Skin rash/lesions?: No Itching?: No  Eyes Blurred vision?: No Double vision?: No  Ears/Nose/Throat Sore throat?: No Sinus problems?: Yes  Hematologic/Lymphatic Swollen glands?: No Easy bruising?: No  Cardiovascular Leg swelling?: No Chest pain?: No  Respiratory Cough?: No Shortness of breath?: No  Endocrine Excessive thirst?: Yes  Musculoskeletal Back pain?: Yes Joint pain?: Yes  Neurological Headaches?: Yes Dizziness?: No  Psychologic Depression?: Yes Anxiety?: Yes  Physical Exam: BP 108/67 mmHg  Pulse 62  Ht 5\' 11"  (1.803 m)  Wt 194 lb 12.8 oz (88.361 kg)  BMI 27.18 kg/m2  Constitutional:  Alert and oriented, No acute distress. HEENT: [place] AT, moist mucus  membranes.  Trachea midline, no masses. Cardiovascular: No clubbing, cyanosis, or edema. Respiratory: Normal respiratory effort, no increased work of breathing. GI: Abdomen is soft, nontender, nondistended, no abdominal masses GU: No CVA tenderness. Normal phallus. Testicles bilaterally. DRE: 3+, smooth, nontender palpation, no nodules, benign. Skin: No rashes, bruises or suspicious lesions. Lymph: No  cervical or inguinal adenopathy. Neurologic: Grossly intact, no focal deficits, moving all 4 extremities. Psychiatric: Normal mood and affect.  Laboratory Data: Lab Results  Component Value Date   WBC 6.4 09/16/2015   HGB 10.8* 09/16/2015   HCT 32.2* 09/16/2015   MCV 91.0 09/16/2015   PLT 293.0 09/16/2015    Lab Results  Component Value Date   CREATININE 1.27 09/16/2015    Lab Results  Component Value Date   PSA 0.07* 09/16/2015    No results found for: TESTOSTERONE  Lab Results  Component Value Date   HGBA1C 6.8* 09/16/2015    Urinalysis No results found for: COLORURINE, APPEARANCEUR, LABSPEC, PHURINE, GLUCOSEU, HGBUR, BILIRUBINUR, KETONESUR, PROTEINUR, UROBILINOGEN, NITRITE, LEUKOCYTESUR    Assessment & Plan:   1. BPH (benign prostatic hyperplasia) -Continue Flomax -We'll add finasteride 5 mg daily. Hopefully this will help shrink his prostate to improve his urinary symptoms. -Follow up in 3 months  2. Prostate cancer screening Up-to-date. Normal DRE and PSA.  Return in about 3 months (around 01/07/2016).  Nickie Retort, MD  [facility] [address], [place], [place] [address] [phone]

## 2015-10-12 ENCOUNTER — Ambulatory Visit (INDEPENDENT_AMBULATORY_CARE_PROVIDER_SITE_OTHER): Payer: PPO | Admitting: Licensed Clinical Social Worker

## 2015-10-12 DIAGNOSIS — F411 Generalized anxiety disorder: Secondary | ICD-10-CM | POA: Diagnosis not present

## 2015-10-12 DIAGNOSIS — F329 Major depressive disorder, single episode, unspecified: Secondary | ICD-10-CM

## 2015-10-12 DIAGNOSIS — F32A Depression, unspecified: Secondary | ICD-10-CM

## 2015-10-12 NOTE — Progress Notes (Signed)
Comprehensive Clinical Assessment (CCA) Note  10/12/2015 James Hood [id number]  Visit Diagnosis:      ICD-9-CM ICD-10-CM   1. GAD (generalized anxiety disorder) 300.02 F41.1   2. Depression 311 F32.9       CCA Part One  Part One has been completed on paper by the patient.  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, divorced 16 yrs ago, financial concerns, worried about his current wife and how she will survive when he dies Individual's Strengths: hard worker, family oriented Individual's Abilities: communicates well, understands, hardworker Type of Services Patient Feels Are Needed: therapy  Mental Health Symptoms Depression:  Depression: Change in energy/activity, Difficulty Concentrating, Irritability, Sleep (too much or little)  Mania:     Anxiety:   Anxiety: Difficulty concentrating, Irritability, Worrying  Psychosis:     Trauma:     Obsessions:     Compulsions:     Inattention:     Hyperactivity/Impulsivity:     Oppositional/Defiant Behaviors:     Borderline Personality:     Other Mood/Personality Symptoms:      Mental Status Exam Appearance and self-care  Stature:  Stature: Average  Weight:  Weight: Average weight  Clothing:  Clothing: Casual  Grooming:  Grooming: Normal  Cosmetic use:  Cosmetic Use: None  Posture/gait:  Posture/Gait: Normal  Motor activity:  Motor Activity: Not Remarkable  Sensorium  Attention:  Attention: Normal  Concentration:  Concentration: Anxiety interferes  Orientation:  Orientation: X5  Recall/memory:  Recall/Memory: Normal  Affect and Mood  Affect:  Affect: Appropriate  Mood:  Mood:  Anxious  Relating  Eye contact:  Eye Contact: Normal  Facial expression:  Facial Expression: Responsive  Attitude toward examiner:  Attitude Toward Examiner: Cooperative  Thought and Language  Speech flow: Speech Flow: Normal  Thought content:  Thought Content: Appropriate to mood and circumstances  Preoccupation:     Hallucinations:     Organization:     Transport planner of Knowledge:  Fund of Knowledge: Average  Intelligence:  Intelligence: Average  Abstraction:  Abstraction: Normal  Judgement:  Judgement: Normal  Reality Testing:  Reality Testing: Adequate  Insight:  Insight: Good  Decision Making:  Decision Making: Normal  Social Functioning  Social Maturity:  Social Maturity: Responsible  Social Judgement:  Social Judgement: Normal  Stress  Stressors:  Stressors: Family conflict, Chiropodist, Transitions  Coping Ability:  Coping Ability: English as a second language teacher Deficits:     Supports:      Family and Psychosocial History: Family history Marital status: Married Number of Years Married: 2 What types of issues is patient dealing with in the relationship?: financial Are you sexually active?: No What is your sexual orientation?: heterosexual Has your sexual activity been affected by drugs, alcohol, medication, or emotional stress?: denies Does patient have children?: Yes How many children?: 1 (1 bio son; 3 step children) How is patient's relationship with their children?: positive  Childhood History:  Childhood History By whom was/is the patient raised?: Both parents Additional childhood history information: limited income as a child, teased as a child, fearful of his father, mother loving Description of patient's relationship with caregiver when they were a child: fearful of father; mother loving  Patient's description of current relationship with people who raised him/her: currently deceased How were you disciplined when you got in trouble as a child/adolescent?:  timeout Does patient have siblings?: Yes Number of Siblings: 1 Description of patient's current relationship with siblings: positive, talk often Did patient suffer any verbal/emotional/physical/sexual abuse as a child?: No Did patient suffer from severe childhood neglect?: No Has patient ever been sexually abused/assaulted/raped as an adolescent or adult?: No Was the patient ever a victim of a crime or a disaster?: No Witnessed domestic violence?: No Has patient been effected by domestic violence as an adult?: No  CCA Part Two B  Employment/Work Situation: Employment / Work Copywriter, advertising Employment situation: Retired Archivist job has been impacted by current illness: No What is the longest time patient has a held a job?: 40yrs 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 [place] patient ever been in the [employer]?: No  Education: Education Name of [place]: [employer] in [place] Did You Graduate From [employer]?: Yes Did Physicist, medical?: Yes What Type of College Degree Do you Have?: BA in Therapist, occupational Pre-Law in 1974 Did [place]?: No Did You Have An Individualized Education Program (IIEP): No Did You Have Any Difficulty At [employer]?: No  Religion: Religion/Spirituality Are You A Religious Person?: Yes What is Your Religious Affiliation?: Darrick Meigs (attends [employer] in [place])  Leisure/Recreation: Leisure / Recreation Leisure and Hobbies: reading, watching tv, listening to music, gardening  Exercise/Diet: Exercise/Diet Do You Exercise?: No Have You Gained or Lost A Significant Amount of Weight in the Past Six Months?: No Do You Follow a Special Diet?: No Do You Have Any Trouble Sleeping?: Yes Explanation of Sleeping Difficulties: takes sleeping pills (Seroquel)  CCA Part Two C  Alcohol/Drug Use: Alcohol / Drug Use Prescriptions: Seroquel History of alcohol / drug use?:  Yes Substance #1 Name of Substance 1: Alcohol 1 - Age of First Use: 16 1 - Amount (size/oz): "a lot; everytime I drank I blacked out" 1 - Frequency: daily 1 - Duration: 28 years 1 - Last Use / Amount: 30 years ago                    CCA Part Three  ASAM's:  Six Dimensions of Multidimensional Assessment  Dimension 1:  Acute Intoxication and/or Withdrawal Potential:     Dimension 2:  Biomedical Conditions and Complications:     Dimension 3:  Emotional, Behavioral, or Cognitive Conditions and Complications:     Dimension 4:  Readiness to Change:     Dimension 5:  Relapse, Continued use, or Continued Problem Potential:     Dimension 6:  Recovery/Living Environment:      Substance use Disorder (SUD)    Social Function:  Social Functioning Social Maturity: Responsible Social Judgement: Normal  Stress:  Stress Stressors: Family conflict, Money, Transitions Coping Ability: Overwhelmed Patient Takes Medications The Way The Doctor Instructed?: Yes Priority Risk: Low Acuity  Risk Assessment- Self-Harm Potential: Risk Assessment For Self-Harm Potential Thoughts of Self-Harm: No current thoughts Method: No plan Availability of Means: No access/NA  Risk Assessment -Dangerous to Others Potential: Risk Assessment For Dangerous to Others Potential Method: No Plan Availability of Means: No access or NA Intent: Vague intent or NA Notification Required: No need or identified person  DSM5 Diagnoses: Patient Active Problem List   Diagnosis Date Noted  . Has a tremor 10/02/2015  . Falls 09/16/2015  .  Barrett's esophagus 09/16/2015  . Chronic lumbar pain 09/16/2015  . Prostate asymmetry 09/16/2015  . Trochanteric bursitis of left hip 09/09/2015  . Headache 09/09/2015  . Controlled type 2 diabetes mellitus without complication ([place]) [id number]  . Difficulty in walking 06/01/2015  . Amnesia 06/01/2015  . Facial droop 05/03/2015  . Benign essential tremor 03/10/2015  . HLD  (hyperlipidemia) 02/23/2015  . Episode of syncope 11/23/2014  . DJD (degenerative joint disease) of knee 11/15/2014  . Hypertension   . Diabetes mellitus type 2 in nonobese (HCC)   . Tremor, essential   . Primary localized osteoarthritis of right knee   . Anxiety and depression   . Clinical depression 01/06/2014  . Absence of sensation 11/18/2013  . Current tobacco use 11/18/2013  . Chronic diarrhea 11/12/2013  . Benign neoplasm of colon 08/25/2013  . 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 Type 2 diabetes mellitus ([place]) 10/06/2012  . Static tremor 10/06/2012    Patient Centered Plan: Patient is on the following Treatment Plan(s):  Anxiety, Depression and Low Self-Esteem  Recommendations for Services/Supports/Treatments: Recommendations for Services/Supports/Treatments Recommendations For Services/Supports/Treatments: Individual Therapy, Medication Management   Referrals to Alternative Service(s): Referred to Alternative Service(s):   Place:   Date:   Time:    Referred to Alternative Service(s):   Place:   Date:   Time:    Referred to Alternative Service(s):   Place:   Date:   Time:    Referred to Alternative Service(s):   Place:   Date:   Time:     Lubertha South

## 2015-10-19 ENCOUNTER — Ambulatory Visit (INDEPENDENT_AMBULATORY_CARE_PROVIDER_SITE_OTHER): Payer: PPO | Admitting: Licensed Clinical Social Worker

## 2015-10-19 DIAGNOSIS — F411 Generalized anxiety disorder: Secondary | ICD-10-CM | POA: Diagnosis not present

## 2015-10-19 DIAGNOSIS — F329 Major depressive disorder, single episode, unspecified: Secondary | ICD-10-CM

## 2015-10-19 DIAGNOSIS — F32A Depression, unspecified: Secondary | ICD-10-CM

## 2015-10-25 NOTE — Progress Notes (Signed)
   THERAPIST PROGRESS NOTE  Session Time: 19min  Participation Level: Active  Behavioral Response: CasualAlertDepressed  Type of Therapy: Individual Therapy  Treatment Goals addressed: Coping and Diagnosis: Depression  Interventions: CBT, Motivational Interviewing, Solution Focused, Supportive, Family Systems and Reframing  Summary: James Hood is a 75 y.o. male who presents with continued symptoms of his diagnosis.  Discussion of his recent thoughts, behavior and feelings and his ability to manage them.  Discussion of his ability to cope with previous decisions made.  Discussion of his ability to journal and budget.  Discussion of [employer].  Informed Patient the role in therapy and that he has to play a part in his own recovery.  Discussion of homework (using coping skills, journaling).  Suicidal/Homicidal: Nowithout intent/plan  Therapist Response: LCSW provided Patient with ongoing emotional support and encouragement.  Normalized his feelings.  Processed various strategies for dealing with stressors.    Plan: Return again in 2 weeks.  Diagnosis: Axis I: Depression    Axis II: No diagnosis    Lubertha South, LCSW 10/19/2015

## 2015-10-27 DIAGNOSIS — M545 Low back pain: Secondary | ICD-10-CM | POA: Diagnosis not present

## 2015-10-27 DIAGNOSIS — M5416 Radiculopathy, lumbar region: Secondary | ICD-10-CM | POA: Diagnosis not present

## 2015-10-27 DIAGNOSIS — M5116 Intervertebral disc disorders with radiculopathy, lumbar region: Secondary | ICD-10-CM | POA: Diagnosis not present

## 2015-10-31 ENCOUNTER — Ambulatory Visit: Payer: PPO | Admitting: Licensed Clinical Social Worker

## 2015-11-04 DIAGNOSIS — M5116 Intervertebral disc disorders with radiculopathy, lumbar region: Secondary | ICD-10-CM | POA: Diagnosis not present

## 2015-11-04 DIAGNOSIS — M545 Low back pain: Secondary | ICD-10-CM | POA: Diagnosis not present

## 2015-11-04 DIAGNOSIS — M5416 Radiculopathy, lumbar region: Secondary | ICD-10-CM | POA: Diagnosis not present

## 2015-11-24 DIAGNOSIS — M545 Low back pain: Secondary | ICD-10-CM | POA: Diagnosis not present

## 2015-11-24 DIAGNOSIS — M5116 Intervertebral disc disorders with radiculopathy, lumbar region: Secondary | ICD-10-CM | POA: Diagnosis not present

## 2015-11-24 DIAGNOSIS — M5416 Radiculopathy, lumbar region: Secondary | ICD-10-CM | POA: Diagnosis not present

## 2015-12-02 DIAGNOSIS — M5116 Intervertebral disc disorders with radiculopathy, lumbar region: Secondary | ICD-10-CM | POA: Diagnosis not present

## 2015-12-02 DIAGNOSIS — M545 Low back pain: Secondary | ICD-10-CM | POA: Diagnosis not present

## 2015-12-02 DIAGNOSIS — M5416 Radiculopathy, lumbar region: Secondary | ICD-10-CM | POA: Diagnosis not present

## 2015-12-09 ENCOUNTER — Ambulatory Visit (INDEPENDENT_AMBULATORY_CARE_PROVIDER_SITE_OTHER): Payer: PPO | Admitting: Family Medicine

## 2015-12-09 ENCOUNTER — Encounter: Payer: Self-pay | Admitting: Family Medicine

## 2015-12-09 VITALS — BP 120/72 | HR 96 | Wt 187.6 lb

## 2015-12-09 DIAGNOSIS — F419 Anxiety disorder, unspecified: Secondary | ICD-10-CM

## 2015-12-09 DIAGNOSIS — F329 Major depressive disorder, single episode, unspecified: Secondary | ICD-10-CM

## 2015-12-09 DIAGNOSIS — F418 Other specified anxiety disorders: Secondary | ICD-10-CM

## 2015-12-09 DIAGNOSIS — E1142 Type 2 diabetes mellitus with diabetic polyneuropathy: Secondary | ICD-10-CM

## 2015-12-09 DIAGNOSIS — E781 Pure hyperglyceridemia: Secondary | ICD-10-CM

## 2015-12-09 DIAGNOSIS — D649 Anemia, unspecified: Secondary | ICD-10-CM

## 2015-12-09 DIAGNOSIS — N400 Enlarged prostate without lower urinary tract symptoms: Secondary | ICD-10-CM

## 2015-12-09 DIAGNOSIS — F32A Depression, unspecified: Secondary | ICD-10-CM

## 2015-12-09 MED ORDER — CLORAZEPATE DIPOTASSIUM 3.75 MG PO TABS: 3.7500 mg | ORAL_TABLET | Freq: Every evening | ORAL | Status: DC | PRN

## 2015-12-09 NOTE — Assessment & Plan Note (Signed)
Asymptomatic. No blood in his stool. Improved from last check. We will have him complete stool cards. He got out of the office without getting the stool cards and we'll give these to him at his next lab appointment visit.

## 2015-12-09 NOTE — Assessment & Plan Note (Addendum)
Patient is on fenofibrate and simvastatin. He'll continue these. Check CMP next week.

## 2015-12-09 NOTE — Assessment & Plan Note (Signed)
Not well controlled recently. Did see a therapist that does not plan to go back and see them. We'll trial him back on clorazepate to see if this is beneficial. He does note rare thoughts of ending his life though has no intent or plan to harm himself and notes it would be a sin to do so so never would do this. Discussed continuing to monitor and if worsens letting us know. Advised that if he developed any intent or plan to harm himself he needed seek medical attention immediately in the emergency room.

## 2015-12-09 NOTE — Patient Instructions (Signed)
Nice to see you. We will have a return for some lab work. Please continue to monitor your depression and anxiety. If this worsens please let us know. I refilled her clorazepate. Please check and see if you're taking metoprolol and propranolol. If you develop thoughts of harming herself or others, chest pain, shortness of breath, palpitations, or any new or changing symptoms please seek medical attention.

## 2015-12-09 NOTE — Assessment & Plan Note (Signed)
Improved on Flomax and finasteride. Continue these medications. Continue to follow with urology if he would like.

## 2015-12-09 NOTE — Progress Notes (Signed)
Tommi Rumps, MD Phone: [phone]  James Hood is a 75 y.o. male who presents today for a follow up.  DIABETES Disease Monitoring: Blood Sugar ranges-not checking, last A1c 6.8 Polyuria/phagia/dipsia- no      ophthalmology- saw in April Medications: Compliance- taking metformin and Amaryl Hypoglycemic symptoms- rare, occasionally gets shaky and will eat something and this improves  Hyperlipidemia: Last LDL well controlled, triglycerides elevated. He increased the dose of his simvastatin. No right upper quadrant discomfort. No claudication. No myalgias. No chest pain. No shortness of breath. Occasional cramps at night though he attributes this to not drinking enough water. Grandson been going on since prior to the increase in medication.  Anemia: Last hemoglobin 10.8. No blood in his stool. Did not get stool cards after these labs return. No palpitations or lightheadedness.  Depression/anxiety: Patient notes this has not been great recently. He notes his depression and anxiety is directly related to his wife's depression and anxiety. She is followed by psychiatrist. Patient has not seen a psychiatrist previously. He did see a counselor several months ago though does not plan to go back to see them. Felt it wasn't helpful. Has been on clorazepate previously for his anxiety though not recently. Feels this would be helpful. He has rarely had refill thoughts of ending his life though has no intent or plan to harm himself and has not had these thoughts recently. He believes this would be a sin if he were to commit suicide and he would like to go to heaven so he would never attempt suicide.  BPH: Patient notes this is significantly improved with Flomax and finasteride. Strength is better. Nocturia 1-2. Following with urology for this.  Patient does wonder if there are any medications he can come off of. Today was noted that he was on metoprolol and propranolol. He is unsure why he is on  either of these medications. I asked him to check at home to make sure he is taking both of them and to let us know who prescribed both of them. Depending on this information we would likely discontinue metoprolol.  PMH: Smoker   ROS see history of present illness  Objective  Physical Exam Filed Vitals:   12/09/15 0850  BP: 120/72  Pulse: 96    BP Readings from Last 3 Encounters:  12/09/15 120/72  10/07/15 108/67  09/16/15 108/62   Wt Readings from Last 3 Encounters:  12/09/15 187 lb 9.6 oz (85.095 kg)  10/07/15 194 lb 12.8 oz (88.361 kg)  09/16/15 192 lb 12.8 oz (87.454 kg)    Physical Exam  Constitutional: He is well-developed, well-nourished, and in no distress.  HENT:  Head: Normocephalic and atraumatic.  Cardiovascular: Normal rate, regular rhythm and normal heart sounds.   Pulmonary/Chest: Effort normal and breath sounds normal.  Neurological: He is alert. Gait normal.  Skin: Skin is warm and dry. He is not diaphoretic.  Psychiatric:  Mood depressed, affect intermittently flat and intermittently laughs     Assessment/Plan: Please see individual problem list.  Type 2 diabetes mellitus ([place]) Most recent A1c 6.8. I would continue Amaryl and metformin. I did have a discussion with him about the lack of benefit of pushing his A1c to low. He will return in 1 week for repeat A1c. We will continue to monitor.  Anxiety and depression Not well controlled recently. Did see a therapist that does not plan to go back and see them. We'll trial him back on clorazepate to see if this is  beneficial. He does note rare thoughts of ending his life though has no intent or plan to harm himself and notes it would be a sin to do so so never would do this. Discussed continuing to monitor and if worsens letting us know. Advised that if he developed any intent or plan to harm himself he needed seek medical attention immediately in the emergency room.  BPH (benign prostatic  hypertrophy) Improved on Flomax and finasteride. Continue these medications. Continue to follow with urology if he would like.  Hypertriglyceridemia Patient is on fenofibrate and simvastatin. He'll continue these. Check CMP next week.  Anemia Asymptomatic. No blood in his stool. Improved from last check. We will have him complete stool cards. He got out of the office without getting the stool cards and we'll give these to him at his next lab appointment visit.    Orders Placed This Encounter  Procedures  . HgB A1c    Standing Status: Future     Number of Occurrences:      Standing Expiration Date: 12/08/2016  . Comp Met (CMET)    Standing Status: Future     Number of Occurrences:      Standing Expiration Date: 12/08/2016    Meds ordered this encounter  Medications  . traMADol (ULTRAM) 50 MG tablet    Sig: Take 50 mg by mouth every 6 (six) hours as needed.  . clorazepate (TRANXENE) 3.75 MG tablet    Sig: Take 1 tablet (3.75 mg total) by mouth at bedtime as needed for anxiety.    Dispense:  30 tablet    Refill:  0   Tommi Rumps, MD [place]

## 2015-12-09 NOTE — Assessment & Plan Note (Addendum)
Most recent A1c 6.8. I would continue Amaryl and metformin. I did have a discussion with him about the lack of benefit of pushing his A1c to low. He will return in 1 week for repeat A1c. We will continue to monitor.

## 2015-12-22 ENCOUNTER — Other Ambulatory Visit: Payer: PPO

## 2016-01-02 DIAGNOSIS — R413 Other amnesia: Secondary | ICD-10-CM | POA: Diagnosis not present

## 2016-01-02 DIAGNOSIS — G250 Essential tremor: Secondary | ICD-10-CM | POA: Diagnosis not present

## 2016-01-02 DIAGNOSIS — I10 Essential (primary) hypertension: Secondary | ICD-10-CM | POA: Diagnosis not present

## 2016-01-02 DIAGNOSIS — R251 Tremor, unspecified: Secondary | ICD-10-CM | POA: Diagnosis not present

## 2016-01-02 DIAGNOSIS — R262 Difficulty in walking, not elsewhere classified: Secondary | ICD-10-CM | POA: Diagnosis not present

## 2016-01-02 DIAGNOSIS — R200 Anesthesia of skin: Secondary | ICD-10-CM | POA: Diagnosis not present

## 2016-01-02 DIAGNOSIS — G479 Sleep disorder, unspecified: Secondary | ICD-10-CM | POA: Diagnosis not present

## 2016-01-02 DIAGNOSIS — E119 Type 2 diabetes mellitus without complications: Secondary | ICD-10-CM | POA: Diagnosis not present

## 2016-01-06 ENCOUNTER — Ambulatory Visit: Payer: Self-pay

## 2016-01-12 ENCOUNTER — Ambulatory Visit: Payer: Self-pay

## 2016-01-12 DIAGNOSIS — M5416 Radiculopathy, lumbar region: Secondary | ICD-10-CM | POA: Diagnosis not present

## 2016-01-12 DIAGNOSIS — M549 Dorsalgia, unspecified: Secondary | ICD-10-CM | POA: Diagnosis not present

## 2016-01-17 ENCOUNTER — Other Ambulatory Visit: Payer: Self-pay | Admitting: Neurosurgery

## 2016-01-18 ENCOUNTER — Encounter (INDEPENDENT_AMBULATORY_CARE_PROVIDER_SITE_OTHER): Payer: Self-pay

## 2016-01-18 ENCOUNTER — Other Ambulatory Visit (INDEPENDENT_AMBULATORY_CARE_PROVIDER_SITE_OTHER): Payer: PPO

## 2016-01-18 DIAGNOSIS — E1142 Type 2 diabetes mellitus with diabetic polyneuropathy: Secondary | ICD-10-CM | POA: Diagnosis not present

## 2016-01-18 DIAGNOSIS — Z1211 Encounter for screening for malignant neoplasm of colon: Secondary | ICD-10-CM

## 2016-01-18 LAB — COMPREHENSIVE METABOLIC PANEL
ALT: 20 U/L (ref 0–53)
AST: 28 U/L (ref 0–37)
Albumin: 4 g/dL (ref 3.5–5.2)
Alkaline Phosphatase: 35 U/L — ABNORMAL LOW (ref 39–117)
BUN: 31 mg/dL — ABNORMAL HIGH (ref 6–23)
CO2: 29 mEq/L (ref 19–32)
Calcium: 9.5 mg/dL (ref 8.4–10.5)
Chloride: 102 mEq/L (ref 96–112)
Creatinine, Ser: 1.48 mg/dL (ref 0.40–1.50)
GFR: 49.12 mL/min — ABNORMAL LOW (ref >60.00)
Glucose, Bld: 81 mg/dL (ref 70–99)
Potassium: 4.6 mEq/L (ref 3.5–5.1)
Sodium: 139 mEq/L (ref 135–145)
Total Bilirubin: 0.2 mg/dL (ref 0.2–1.2)
Total Protein: 6.5 g/dL (ref 6.0–8.3)

## 2016-01-18 LAB — HEMOGLOBIN A1C: Hgb A1c MFr Bld: 6.7 % — ABNORMAL HIGH (ref 4.6–6.5)

## 2016-01-19 ENCOUNTER — Telehealth: Payer: Self-pay | Admitting: Family Medicine

## 2016-01-19 DIAGNOSIS — N183 Chronic kidney disease, stage 3 (moderate): Secondary | ICD-10-CM

## 2016-01-19 NOTE — Telephone Encounter (Signed)
Pt called wanting to get his kidneys rechecked. Need order please and thank you!

## 2016-01-19 NOTE — Telephone Encounter (Signed)
Patient had labs done yesterday. Kidney function was slightly worse from his last check. I would suggest avoiding NSAIDs if he is taking these now and rechecking in several weeks. Not sure it would be helpful to recheck so soon though we can recheck in several weeks.

## 2016-01-19 NOTE — Telephone Encounter (Signed)
Order placed. 3-4 week appointment for labs will be adequate.

## 2016-01-19 NOTE — Telephone Encounter (Signed)
Patient agreed to wait a few weeks and get it rechecked, he will avoid NSAIDS. You want 4 weeks or more for a recheck to have me schedule him?

## 2016-01-19 NOTE — Telephone Encounter (Signed)
Last full labs in April of this year, please advise and order if needed, thanks

## 2016-01-19 NOTE — Telephone Encounter (Signed)
Spoke with the patient, he is having back surgery on the 15th  Of September and he wanted to know if it can be done then, the surgery will be at [facility], thanks

## 2016-01-20 ENCOUNTER — Other Ambulatory Visit: Payer: Self-pay | Admitting: Family Medicine

## 2016-01-20 NOTE — Telephone Encounter (Signed)
Ok. Pt is scheduled for 01/30/16. Thank you!

## 2016-01-20 NOTE — Telephone Encounter (Signed)
Please have patient stop in here prior to surgery to have the repeat lab? Schedule prior to the 15th, thanks

## 2016-01-20 NOTE — Telephone Encounter (Addendum)
He can have it done in our office prior to the surgery. I'm unsure exactly how to get it done at [facility] when he has his surgery.

## 2016-01-24 ENCOUNTER — Other Ambulatory Visit: Payer: Self-pay

## 2016-01-25 ENCOUNTER — Encounter (HOSPITAL_COMMUNITY)
Admission: RE | Admit: 2016-01-25 | Discharge: 2016-01-25 | Disposition: A | Discharge status: Home / Self Care | Payer: PPO | Source: Ambulatory Visit | Attending: Neurosurgery | Admitting: Neurosurgery

## 2016-01-25 ENCOUNTER — Encounter (HOSPITAL_COMMUNITY): Payer: Self-pay

## 2016-01-25 DIAGNOSIS — Z01810 Encounter for preprocedural cardiovascular examination: Secondary | ICD-10-CM | POA: Insufficient documentation

## 2016-01-25 DIAGNOSIS — Z01812 Encounter for preprocedural laboratory examination: Secondary | ICD-10-CM | POA: Diagnosis not present

## 2016-01-25 DIAGNOSIS — I10 Essential (primary) hypertension: Secondary | ICD-10-CM | POA: Diagnosis not present

## 2016-01-25 HISTORY — DX: Unspecified dementia, unspecified severity, without behavioral disturbance, psychotic disturbance, mood disturbance, and anxiety: F03.90

## 2016-01-25 HISTORY — DX: Personal history of other diseases of the digestive system: Z87.19

## 2016-01-25 LAB — CBC
HCT: 36.1 % — ABNORMAL LOW (ref 39.0–52.0)
Hemoglobin: 11.7 g/dL — ABNORMAL LOW (ref 13.0–17.0)
MCH: 30.6 pg (ref 26.0–34.0)
MCHC: 32.4 g/dL (ref 30.0–36.0)
MCV: 94.5 fL (ref 78.0–100.0)
Platelets: 267 10*3/uL (ref 150–400)
RBC: 3.82 MIL/uL — ABNORMAL LOW (ref 4.22–5.81)
RDW: 13.2 % (ref 11.5–15.5)
WBC: 6.5 10*3/uL (ref 4.0–10.5)

## 2016-01-25 LAB — BASIC METABOLIC PANEL
Anion gap: 12 (ref 5–15)
BUN: 24 mg/dL — ABNORMAL HIGH (ref 6–20)
CO2: 25 mmol/L (ref 22–32)
Calcium: 9.8 mg/dL (ref 8.9–10.3)
Chloride: 103 mmol/L (ref 101–111)
Creatinine, Ser: 1.4 mg/dL — ABNORMAL HIGH (ref 0.61–1.24)
GFR calc Af Amer: 55 mL/min — ABNORMAL LOW (ref >60)
GFR calc non Af Amer: 48 mL/min — ABNORMAL LOW (ref >60)
Glucose, Bld: 126 mg/dL — ABNORMAL HIGH (ref 65–99)
Potassium: 4 mmol/L (ref 3.5–5.1)
Sodium: 140 mmol/L (ref 135–145)

## 2016-01-25 LAB — SURGICAL PCR SCREEN
MRSA, PCR: NEGATIVE
Staphylococcus aureus: NEGATIVE

## 2016-01-25 LAB — GLUCOSE, CAPILLARY: Glucose-Capillary: 93 mg/dL (ref 65–99)

## 2016-01-25 NOTE — Progress Notes (Signed)
Left message for Jessica Hanks @Dr . Botero's office  Of no orders in epic.

## 2016-01-25 NOTE — Pre-Procedure Instructions (Signed)
    RYKEEM SELLINGER  01/25/2016      [place], [place] [address] [place] [address] Phone: [phone] Fax: [phone]    Your procedure is scheduled on 02/03/16.  Report to [facility] Admitting at 930 A.M.  Call this number if you have problems the morning of surgery:  [phone]   Remember:  Do not eat food or drink liquids after midnight.  Take these medicines the morning of surgery with A SIP OF WATER --depakote,aricept,proscar,neurontin,namenda,metoprolol,prilosec,propranolol,flomax,ultram   Do not wear jewelry, make-up or nail polish.  Do not wear lotions, powders, or perfumes, or deoderant.  Do not shave 48 hours prior to surgery.  Men may shave face and neck.  Do not bring valuables to the hospital.  [facility] is not responsible for any belongings or valuables.  Contacts, dentures or bridgework may not be worn into surgery.  Leave your suitcase in the car.  After surgery it may be brought to your room.  For patients admitted to the hospital, discharge time will be determined by your treatment team.  Patients discharged the day of surgery will not be allowed to drive home.   Name and phone number of your driver:   Special instructions: Do not take any aspirin,anti-inflammatories,vitamins,or herbal supplements 5-7 days prior to surgery.  Please read over the following fact sheets that you were given. MRSA Information

## 2016-01-26 LAB — HEMOGLOBIN A1C
Hgb A1c MFr Bld: 6.2 % — ABNORMAL HIGH (ref 4.8–5.6)
Mean Plasma Glucose: 131 mg/dL

## 2016-01-27 ENCOUNTER — Ambulatory Visit (INDEPENDENT_AMBULATORY_CARE_PROVIDER_SITE_OTHER): Payer: PPO | Admitting: Urology

## 2016-01-27 VITALS — BP 100/68 | HR 76 | Ht 67.0 in | Wt 192.0 lb

## 2016-01-27 DIAGNOSIS — N400 Enlarged prostate without lower urinary tract symptoms: Secondary | ICD-10-CM

## 2016-01-27 NOTE — Progress Notes (Signed)
01/27/2016 4:27 PM   James Hood 07-Sep-1940 [id number]  Referring provider: Leone Haven, MD [address] [place], [place] [address]  Chief Complaint  Patient presents with  . 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 At his last visit he was started on the finasteride. He is very happy with this medication. His I PSS score is 18/2. His major stenoses feels he is emptying his bladder better and has a better stream. He is very happy with his progress.  DRE at his last visit was 3+ benign.  PVR: 120 cc PSA: 0.07 (May 2017)    PMH: Past Medical History:  Diagnosis Date  . Anxiety   . Anxiety and depression   . Arthritis   . Colon polyps   . Dementia   . Depression   . Diabetes mellitus type 2 in nonobese (HCC)   . GERD (gastroesophageal reflux disease)   . Headache   . 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[facility])   . History of hiatal hernia   . Hypercholesteremia   . Hypertension   . Hyperthyroidism   . Neuropathic pain   . Primary localized osteoarthritis of right knee   . Stomach ulcer   . Tremor, essential     Surgical History: Past Surgical History:  Procedure Laterality Date  . esophageal stretch    . JOINT REPLACEMENT    . TONSILLECTOMY    . TOTAL KNEE ARTHROPLASTY Right 11/15/2014   Procedure: TOTAL KNEE ARTHROPLASTY;  Surgeon: Elsie Saas, MD;  Location: [facility];  Service: Orthopedics;  Laterality: Right;    Home Medications:    Medication List       Accurate as of 01/27/16  4:27 PM. Always use your most recent med list.          acetaminophen 500 MG tablet Commonly known as:  TYLENOL Take 1,000 mg by mouth 2 (two) times daily.   aspirin EC 81 MG tablet Take 81 mg by mouth every morning.   buPROPion 150 MG 12 hr tablet Commonly known as:  ZYBAN Take 150 mg by mouth 2 (two) times daily.     clorazepate 3.75 MG tablet Commonly known as:  TRANXENE Take 1 tablet (3.75 mg total) by mouth at bedtime as needed for anxiety.   colestipol 1 g tablet Commonly known as:  COLESTID Take 2 g by mouth daily.   cyclobenzaprine 10 MG tablet Commonly known as:  FLEXERIL Take 10 mg by mouth 3 (three) times daily as needed for muscle spasms.   diphenoxylate-atropine 2.5-0.025 MG tablet Commonly known as:  LOMOTIL Take 1-2 tablets by mouth 2 (two) times daily. Take 2 tablets in the morning, and 1 tablet in the evening   divalproex 250 MG 24 hr tablet Commonly known as:  DEPAKOTE ER Take 250 mg by mouth 2 (two) times daily. Pt takes 250mg  in the morning, and 250mg  in the afternoon   donepezil 10 MG tablet Commonly known as:  ARICEPT Take 10 mg by mouth at bedtime.   fenofibrate 145 MG tablet Commonly known as:  TRICOR Take 145 mg by mouth daily.   finasteride 5 MG tablet Commonly known as:  PROSCAR Take 1 tablet (5 mg total) by mouth daily.   gabapentin 600 MG tablet Commonly known as:  NEURONTIN Take 1,200 mg by mouth 3 (three) times daily.   glimepiride 4 MG  tablet Commonly known as:  AMARYL Take 4 mg by mouth 2 (two) times daily.   lisinopril-hydrochlorothiazide 20-25 MG tablet Commonly known as:  PRINZIDE,ZESTORETIC Take 1 tablet by mouth 2 (two) times daily.   memantine 5 MG tablet Commonly known as:  NAMENDA Take 5 mg by mouth 2 (two) times daily.   metFORMIN 500 MG tablet Commonly known as:  GLUCOPHAGE Take 1,000 mg by mouth 2 (two) times daily with a meal.   metoprolol tartrate 25 MG tablet Commonly known as:  LOPRESSOR Take 25 mg by mouth 2 (two) times daily.   omeprazole 20 MG tablet Commonly known as:  PRILOSEC OTC Take 20 mg by mouth 2 (two) times daily.   primidone 50 MG tablet Commonly known as:  MYSOLINE Take 150-200 mg by mouth See admin instructions. Pt takes 200mg  in the morning, and 150mg  in the evening   propranolol 60 MG tablet Commonly  known as:  INDERAL Take 60 mg by mouth 2 (two) times daily.   QUEtiapine 25 MG tablet Commonly known as:  SEROQUEL Take 75 mg by mouth at bedtime.   simvastatin 40 MG tablet Commonly known as:  ZOCOR Take 1 tablet (40 mg total) by mouth daily.   tamsulosin 0.4 MG Caps capsule Commonly known as:  FLOMAX Take 0.4 mg by mouth daily after supper.   traMADol 50 MG tablet Commonly known as:  ULTRAM Take 50 mg by mouth every 6 (six) hours as needed for moderate pain.       Allergies: No Known Allergies  Family History: Family History  Problem Relation Age of Onset  . Heart attack Mother   . Hypertension Father   . Kidney cancer Neg Hx   . Prostate cancer Neg Hx     Social History:  reports that he has been smoking Cigarettes.  He has a 60.00 pack-year smoking history. He does not have any smokeless tobacco history on file. He reports that he does not drink alcohol or use drugs.  ROS:                                        Physical Exam: BP 100/68   Pulse 76   Ht 5\' 7"  (1.702 m)   Wt 192 lb (87.1 kg)   BMI 30.07 kg/m   Constitutional:  Alert and oriented, No acute distress. HEENT: [place] AT, moist mucus membranes.  Trachea midline, no masses. Cardiovascular: No clubbing, cyanosis, or edema. Respiratory: Normal respiratory effort, no increased work of breathing. GI: Abdomen is soft, nontender, nondistended, no abdominal masses GU: No CVA tenderness.  Skin: No rashes, bruises or suspicious lesions. Lymph: No cervical or inguinal adenopathy. Neurologic: Grossly intact, no focal deficits, moving all 4 extremities. Psychiatric: Normal mood and affect.  Laboratory Data: Lab Results  Component Value Date   WBC 6.5 01/25/2016   HGB 11.7 (L) 01/25/2016   HCT 36.1 (L) 01/25/2016   MCV 94.5 01/25/2016   PLT 267 01/25/2016    Lab Results  Component Value Date   CREATININE 1.40 (H) 01/25/2016    Lab Results  Component Value Date   PSA 0.07 (L)  09/16/2015    No results found for: TESTOSTERONE  Lab Results  Component Value Date   HGBA1C 6.2 (H) 01/25/2016    Urinalysis    Component Value Date/Time   APPEARANCEUR Clear 10/07/2015 1121   GLUCOSEU Negative 10/07/2015 1121  BILIRUBINUR Negative 10/07/2015 1121   PROTEINUR Negative 10/07/2015 1121   NITRITE Negative 10/07/2015 1121   LEUKOCYTESUR Negative 10/07/2015 1121      Assessment & Plan:    1. BPH -continue flomax, finasteride  Return in about 1 year (around 01/26/2017).  Nickie Retort, MD  [facility] [address], [place], [place] [address] [phone]

## 2016-01-30 ENCOUNTER — Other Ambulatory Visit (INDEPENDENT_AMBULATORY_CARE_PROVIDER_SITE_OTHER): Payer: PPO

## 2016-01-30 DIAGNOSIS — N183 Chronic kidney disease, stage 3 (moderate): Secondary | ICD-10-CM | POA: Diagnosis not present

## 2016-01-30 LAB — BASIC METABOLIC PANEL
BUN: 27 mg/dL — ABNORMAL HIGH (ref 6–23)
CO2: 28 mEq/L (ref 19–32)
Calcium: 9.1 mg/dL (ref 8.4–10.5)
Chloride: 99 mEq/L (ref 96–112)
Creatinine, Ser: 1.49 mg/dL (ref 0.40–1.50)
GFR: 48.74 mL/min — ABNORMAL LOW (ref >60.00)
Glucose, Bld: 117 mg/dL — ABNORMAL HIGH (ref 70–99)
Potassium: 4.8 mEq/L (ref 3.5–5.1)
Sodium: 134 mEq/L — ABNORMAL LOW (ref 135–145)

## 2016-02-02 NOTE — H&P (Signed)
James Hood is an 75 y.o. male.   Chief Complaint: left leg pain HPI: patient seen along with his wife complaining of back pain with radiation to the left leg associated with weakness and pain no better with conservative treatment. A lumbar mri showed a left l5s1 herniateddisc  Past Medical History:  Diagnosis Date  . Anxiety   . Anxiety and depression   . Arthritis   . Colon polyps   . Dementia   . Depression   . Diabetes mellitus type 2 in nonobese (HCC)   . GERD (gastroesophageal reflux disease)   . Headache   . 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[facility])   . History of hiatal hernia   . Hypercholesteremia   . Hypertension   . Hyperthyroidism   . Neuropathic pain   . Primary localized osteoarthritis of right knee   . Stomach ulcer   . Tremor, essential     Past Surgical History:  Procedure Laterality Date  . esophageal stretch    . JOINT REPLACEMENT    . TONSILLECTOMY    . TOTAL KNEE ARTHROPLASTY Right 11/15/2014   Procedure: TOTAL KNEE ARTHROPLASTY;  Surgeon: Elsie Saas, MD;  Location: [facility];  Service: Orthopedics;  Laterality: Right;    Family History  Problem Relation Age of Onset  . Heart attack Mother   . Hypertension Father   . Kidney cancer Neg Hx   . Prostate cancer Neg Hx    Social History:  reports that he has been smoking Cigarettes.  He has a 60.00 pack-year smoking history. He does not have any smokeless tobacco history on file. He reports that he does not drink alcohol or use drugs.  Allergies:  Allergies  Allergen Reactions  . No Known Allergies     No prescriptions prior to admission.    No results found for this or any previous visit (from the past 48 hour(s)). No results found.  Review of Systems  Constitutional: Negative.   HENT: Positive for tinnitus.   Eyes: Negative.   Respiratory: Negative.   Cardiovascular: Negative.   Gastrointestinal: Negative.   Genitourinary: Negative.   Musculoskeletal: Positive for back pain.  Skin: Negative.    Neurological: Positive for sensory change and focal weakness.  Psychiatric/Behavioral: Positive for depression. The patient is nervous/anxious.     There were no vitals taken for this visit. Physical Exam  Hent, nl. Neck, nl. Cv, nl. Lungs,clear. Abdomen,nl. Extremities,nl. NEURO PATINET ORIENTED, ABLE TO TELL ME ABOUT HIS PROBLEM.  Weakness of df and pf ot the left foot. Sensory and dtr wnl. Assessment/Plan Patient decided to go ahead with surgery after he saw me 2 weeks ago. He and his wife are aware of risks such as csf leak, worsening of the pain, infection and need of further surgery  Floyce Stakes, MD 02/02/2016, 8:01 PM

## 2016-02-03 ENCOUNTER — Ambulatory Visit (HOSPITAL_COMMUNITY): Payer: PPO | Admitting: Anesthesiology

## 2016-02-03 ENCOUNTER — Encounter (HOSPITAL_COMMUNITY)
Admission: AD | Disposition: A | Discharge status: Home / Self Care | Payer: Self-pay | Source: Ambulatory Visit | Attending: Neurosurgery

## 2016-02-03 ENCOUNTER — Other Ambulatory Visit: Payer: Self-pay | Admitting: Family Medicine

## 2016-02-03 ENCOUNTER — Inpatient Hospital Stay (HOSPITAL_COMMUNITY)
Admission: AD | Admit: 2016-02-03 | Discharge: 2016-02-03 | DRG: 520 | Disposition: A | Discharge status: Home / Self Care | Payer: PPO | Source: Ambulatory Visit | Attending: Neurosurgery | Admitting: Neurosurgery

## 2016-02-03 ENCOUNTER — Inpatient Hospital Stay (HOSPITAL_COMMUNITY): Payer: PPO

## 2016-02-03 DIAGNOSIS — F0390 Unspecified dementia without behavioral disturbance: Secondary | ICD-10-CM | POA: Diagnosis not present

## 2016-02-03 DIAGNOSIS — M5117 Intervertebral disc disorders with radiculopathy, lumbosacral region: Principal | ICD-10-CM | POA: Diagnosis present

## 2016-02-03 DIAGNOSIS — K219 Gastro-esophageal reflux disease without esophagitis: Secondary | ICD-10-CM | POA: Diagnosis not present

## 2016-02-03 DIAGNOSIS — M5126 Other intervertebral disc displacement, lumbar region: Secondary | ICD-10-CM | POA: Diagnosis not present

## 2016-02-03 DIAGNOSIS — Z8711 Personal history of peptic ulcer disease: Secondary | ICD-10-CM | POA: Diagnosis not present

## 2016-02-03 DIAGNOSIS — F1021 Alcohol dependence, in remission: Secondary | ICD-10-CM | POA: Diagnosis not present

## 2016-02-03 DIAGNOSIS — F329 Major depressive disorder, single episode, unspecified: Secondary | ICD-10-CM | POA: Diagnosis not present

## 2016-02-03 DIAGNOSIS — Z96651 Presence of right artificial knee joint: Secondary | ICD-10-CM | POA: Diagnosis not present

## 2016-02-03 DIAGNOSIS — M1990 Unspecified osteoarthritis, unspecified site: Secondary | ICD-10-CM | POA: Diagnosis not present

## 2016-02-03 DIAGNOSIS — E119 Type 2 diabetes mellitus without complications: Secondary | ICD-10-CM | POA: Diagnosis not present

## 2016-02-03 DIAGNOSIS — Z7984 Long term (current) use of oral hypoglycemic drugs: Secondary | ICD-10-CM

## 2016-02-03 DIAGNOSIS — F419 Anxiety disorder, unspecified: Secondary | ICD-10-CM | POA: Diagnosis not present

## 2016-02-03 DIAGNOSIS — E7800 Pure hypercholesterolemia, unspecified: Secondary | ICD-10-CM | POA: Diagnosis present

## 2016-02-03 DIAGNOSIS — M79605 Pain in left leg: Secondary | ICD-10-CM | POA: Diagnosis not present

## 2016-02-03 DIAGNOSIS — M47816 Spondylosis without myelopathy or radiculopathy, lumbar region: Secondary | ICD-10-CM | POA: Diagnosis not present

## 2016-02-03 DIAGNOSIS — M4726 Other spondylosis with radiculopathy, lumbar region: Secondary | ICD-10-CM | POA: Diagnosis not present

## 2016-02-03 DIAGNOSIS — Z419 Encounter for procedure for purposes other than remedying health state, unspecified: Secondary | ICD-10-CM

## 2016-02-03 HISTORY — PX: LUMBAR LAMINECTOMY/DECOMPRESSION MICRODISCECTOMY: SHX5026

## 2016-02-03 LAB — GLUCOSE, CAPILLARY
Glucose-Capillary: 163 mg/dL — ABNORMAL HIGH (ref 65–99)
Glucose-Capillary: 121 mg/dL — ABNORMAL HIGH (ref 65–99)
Glucose-Capillary: 151 mg/dL — ABNORMAL HIGH (ref 65–99)

## 2016-02-03 SURGERY — LUMBAR LAMINECTOMY/DECOMPRESSION MICRODISCECTOMY 1 LEVEL
Anesthesia: General | Laterality: Left

## 2016-02-03 MED ORDER — MEMANTINE HCL 5 MG PO TABS
5.0000 mg | ORAL_TABLET | Freq: Two times a day (BID) | ORAL | Status: DC
  Filled 2016-02-03: qty 1

## 2016-02-03 MED ORDER — THROMBIN 5000 UNITS EX SOLR
CUTANEOUS | Status: DC | PRN
  Administered 2016-02-03 (×2): 5000 [IU] via TOPICAL

## 2016-02-03 MED ORDER — 0.9 % SODIUM CHLORIDE (POUR BTL) OPTIME
TOPICAL | Status: DC | PRN
  Administered 2016-02-03: 1000 mL

## 2016-02-03 MED ORDER — PHENYLEPHRINE HCL 10 MG/ML IJ SOLN
INTRAMUSCULAR | Status: DC | PRN
  Administered 2016-02-03: 80 ug via INTRAVENOUS
  Administered 2016-02-03: 120 ug via INTRAVENOUS
  Administered 2016-02-03 (×6): 80 ug via INTRAVENOUS

## 2016-02-03 MED ORDER — QUETIAPINE FUMARATE 50 MG PO TABS
75.0000 mg | ORAL_TABLET | Freq: Every day | ORAL | Status: DC
  Filled 2016-02-03: qty 1

## 2016-02-03 MED ORDER — HYDROCHLOROTHIAZIDE 25 MG PO TABS: 25.0000 mg | ORAL_TABLET | Freq: Two times a day (BID) | ORAL | Status: DC

## 2016-02-03 MED ORDER — SIMVASTATIN 40 MG PO TABS
40.0000 mg | ORAL_TABLET | Freq: Every day | ORAL | Status: DC
Start: 2016-02-03 — End: 2016-02-03
  Administered 2016-02-03: 40 mg via ORAL
  Filled 2016-02-03: qty 1

## 2016-02-03 MED ORDER — DONEPEZIL HCL 10 MG PO TABS
10.0000 mg | ORAL_TABLET | Freq: Every day | ORAL | Status: DC
  Filled 2016-02-03: qty 1

## 2016-02-03 MED ORDER — PRIMIDONE 50 MG PO TABS
150.0000 mg | ORAL_TABLET | Freq: Every day | ORAL | Status: DC
  Filled 2016-02-03: qty 3

## 2016-02-03 MED ORDER — FENTANYL CITRATE (PF) 100 MCG/2ML IJ SOLN
INTRAMUSCULAR | Status: AC
  Filled 2016-02-03: qty 4

## 2016-02-03 MED ORDER — OXYCODONE-ACETAMINOPHEN 5-325 MG PO TABS
ORAL_TABLET | ORAL | Status: AC
  Filled 2016-02-03: qty 2

## 2016-02-03 MED ORDER — MENTHOL 3 MG MT LOZG: 1.0000 | LOZENGE | OROMUCOSAL | Status: DC | PRN

## 2016-02-03 MED ORDER — DIVALPROEX SODIUM ER 250 MG PO TB24
250.0000 mg | ORAL_TABLET | Freq: Two times a day (BID) | ORAL | Status: DC
  Filled 2016-02-03: qty 1

## 2016-02-03 MED ORDER — PROPOFOL 10 MG/ML IV BOLUS
INTRAVENOUS | Status: AC
  Filled 2016-02-03: qty 20

## 2016-02-03 MED ORDER — LACTATED RINGERS IV SOLN
INTRAVENOUS | Status: DC
  Administered 2016-02-03: 09:00:00 via INTRAVENOUS

## 2016-02-03 MED ORDER — GLIMEPIRIDE 4 MG PO TABS
4.0000 mg | ORAL_TABLET | Freq: Two times a day (BID) | ORAL | Status: DC
  Administered 2016-02-03: 4 mg via ORAL
  Filled 2016-02-03 (×2): qty 1

## 2016-02-03 MED ORDER — CYCLOBENZAPRINE HCL 10 MG PO TABS
ORAL_TABLET | ORAL | Status: AC
  Filled 2016-02-03: qty 1

## 2016-02-03 MED ORDER — TAMSULOSIN HCL 0.4 MG PO CAPS
0.4000 mg | ORAL_CAPSULE | Freq: Every day | ORAL | Status: DC
  Administered 2016-02-03: 0.4 mg via ORAL
  Filled 2016-02-03: qty 1

## 2016-02-03 MED ORDER — LISINOPRIL 20 MG PO TABS: 20.0000 mg | ORAL_TABLET | Freq: Two times a day (BID) | ORAL | Status: DC

## 2016-02-03 MED ORDER — GLYCOPYRROLATE 0.2 MG/ML IJ SOLN
INTRAMUSCULAR | Status: DC | PRN
  Administered 2016-02-03: 0.4 mg via INTRAVENOUS

## 2016-02-03 MED ORDER — LISINOPRIL-HYDROCHLOROTHIAZIDE 20-25 MG PO TABS: 1.0000 | ORAL_TABLET | Freq: Two times a day (BID) | ORAL | Status: DC

## 2016-02-03 MED ORDER — HYDROCODONE-ACETAMINOPHEN 7.5-325 MG PO TABS: 1.0000 | ORAL_TABLET | Freq: Once | ORAL | Status: DC | PRN

## 2016-02-03 MED ORDER — ROCURONIUM BROMIDE 100 MG/10ML IV SOLN
INTRAVENOUS | Status: DC | PRN
  Administered 2016-02-03: 50 mg via INTRAVENOUS

## 2016-02-03 MED ORDER — CYCLOBENZAPRINE HCL 5 MG PO TABS
5.0000 mg | ORAL_TABLET | Freq: Three times a day (TID) | ORAL | Status: DC | PRN
  Administered 2016-02-03: 5 mg via ORAL

## 2016-02-03 MED ORDER — FINASTERIDE 5 MG PO TABS
5.0000 mg | ORAL_TABLET | Freq: Every day | ORAL | Status: DC
  Administered 2016-02-03: 5 mg via ORAL
  Filled 2016-02-03: qty 1

## 2016-02-03 MED ORDER — BUPROPION HCL ER (SMOKING DET) 150 MG PO TB12
150.0000 mg | ORAL_TABLET | Freq: Two times a day (BID) | ORAL | Status: DC
  Administered 2016-02-03: 150 mg via ORAL
  Filled 2016-02-03 (×2): qty 1

## 2016-02-03 MED ORDER — PROPOFOL 10 MG/ML IV BOLUS
INTRAVENOUS | Status: DC | PRN
  Administered 2016-02-03: 150 mg via INTRAVENOUS

## 2016-02-03 MED ORDER — ONDANSETRON HCL 4 MG/2ML IJ SOLN
INTRAMUSCULAR | Status: DC | PRN
  Administered 2016-02-03: 4 mg via INTRAVENOUS

## 2016-02-03 MED ORDER — PROPRANOLOL HCL 60 MG PO TABS
60.0000 mg | ORAL_TABLET | Freq: Two times a day (BID) | ORAL | Status: DC
  Filled 2016-02-03: qty 1

## 2016-02-03 MED ORDER — MORPHINE SULFATE (PF) 2 MG/ML IV SOLN: 1.0000 mg | INTRAVENOUS | Status: DC | PRN

## 2016-02-03 MED ORDER — GABAPENTIN 600 MG PO TABS
1200.0000 mg | ORAL_TABLET | Freq: Three times a day (TID) | ORAL | Status: DC
  Administered 2016-02-03: 1200 mg via ORAL
  Filled 2016-02-03: qty 2

## 2016-02-03 MED ORDER — METHYLPREDNISOLONE ACETATE 80 MG/ML IJ SUSP
INTRAMUSCULAR | Status: DC | PRN
  Administered 2016-02-03: 80 mg

## 2016-02-03 MED ORDER — PROMETHAZINE HCL 25 MG/ML IJ SOLN: 6.2500 mg | INTRAMUSCULAR | Status: DC | PRN

## 2016-02-03 MED ORDER — FENTANYL CITRATE (PF) 100 MCG/2ML IJ SOLN
INTRAMUSCULAR | Status: DC
Start: 2016-02-03 — End: 2016-02-03
  Filled 2016-02-03: qty 2

## 2016-02-03 MED ORDER — METOPROLOL TARTRATE 25 MG PO TABS: 25.0000 mg | ORAL_TABLET | Freq: Two times a day (BID) | ORAL | Status: DC

## 2016-02-03 MED ORDER — COLESTIPOL HCL 1 G PO TABS
2.0000 g | ORAL_TABLET | Freq: Every day | ORAL | Status: DC
  Administered 2016-02-03: 2 g via ORAL
  Filled 2016-02-03: qty 2

## 2016-02-03 MED ORDER — SODIUM CHLORIDE 0.9% FLUSH: 3.0000 mL | Freq: Two times a day (BID) | INTRAVENOUS | Status: DC

## 2016-02-03 MED ORDER — DIPHENOXYLATE-ATROPINE 2.5-0.025 MG PO TABS: 1.0000 | ORAL_TABLET | Freq: Two times a day (BID) | ORAL | Status: DC

## 2016-02-03 MED ORDER — ACETAMINOPHEN 650 MG RE SUPP: 650.0000 mg | RECTAL | Status: DC | PRN

## 2016-02-03 MED ORDER — METFORMIN HCL 500 MG PO TABS
1000.0000 mg | ORAL_TABLET | Freq: Two times a day (BID) | ORAL | Status: DC
  Administered 2016-02-03: 1000 mg via ORAL
  Filled 2016-02-03: qty 2

## 2016-02-03 MED ORDER — SODIUM CHLORIDE 0.9% FLUSH: 3.0000 mL | INTRAVENOUS | Status: DC | PRN

## 2016-02-03 MED ORDER — OXYCODONE-ACETAMINOPHEN 5-325 MG PO TABS
1.0000 | ORAL_TABLET | ORAL | Status: DC | PRN
  Administered 2016-02-03: 2 via ORAL
  Filled 2016-02-03: qty 2

## 2016-02-03 MED ORDER — CEFAZOLIN IN D5W 1 GM/50ML IV SOLN: 1.0000 g | Freq: Three times a day (TID) | INTRAVENOUS | Status: DC

## 2016-02-03 MED ORDER — PRIMIDONE 50 MG PO TABS
200.0000 mg | ORAL_TABLET | Freq: Every morning | ORAL | Status: DC
  Filled 2016-02-03: qty 4

## 2016-02-03 MED ORDER — PRIMIDONE 50 MG PO TABS: 150.0000 mg | ORAL_TABLET | ORAL | Status: DC

## 2016-02-03 MED ORDER — PANTOPRAZOLE SODIUM 20 MG PO TBEC
20.0000 mg | DELAYED_RELEASE_TABLET | Freq: Two times a day (BID) | ORAL | Status: DC
  Filled 2016-02-03 (×2): qty 1

## 2016-02-03 MED ORDER — FENTANYL CITRATE (PF) 100 MCG/2ML IJ SOLN
25.0000 ug | INTRAMUSCULAR | Status: DC | PRN
  Administered 2016-02-03: 25 ug via INTRAVENOUS
  Administered 2016-02-03: 50 ug via INTRAVENOUS
  Administered 2016-02-03: 25 ug via INTRAVENOUS

## 2016-02-03 MED ORDER — SODIUM CHLORIDE 0.9 % IV SOLN: INTRAVENOUS | Status: DC

## 2016-02-03 MED ORDER — PHENOL 1.4 % MT LIQD: 1.0000 | OROMUCOSAL | Status: DC | PRN

## 2016-02-03 MED ORDER — CEFAZOLIN SODIUM-DEXTROSE 2-3 GM-% IV SOLR
INTRAVENOUS | Status: DC | PRN
  Administered 2016-02-03: 2 g via INTRAVENOUS

## 2016-02-03 MED ORDER — LACTATED RINGERS IV SOLN
INTRAVENOUS | Status: DC | PRN
  Administered 2016-02-03 (×2): via INTRAVENOUS

## 2016-02-03 MED ORDER — ONDANSETRON HCL 4 MG/2ML IJ SOLN: 4.0000 mg | INTRAMUSCULAR | Status: DC | PRN

## 2016-02-03 MED ORDER — LIDOCAINE HCL (CARDIAC) 20 MG/ML IV SOLN
INTRAVENOUS | Status: DC | PRN
  Administered 2016-02-03: 60 mg via INTRAVENOUS

## 2016-02-03 MED ORDER — ARTIFICIAL TEARS OP OINT
TOPICAL_OINTMENT | OPHTHALMIC | Status: DC | PRN
  Administered 2016-02-03: 1 via OPHTHALMIC

## 2016-02-03 MED ORDER — NEOSTIGMINE METHYLSULFATE 10 MG/10ML IV SOLN
INTRAVENOUS | Status: DC | PRN
  Administered 2016-02-03: 3 mg via INTRAVENOUS

## 2016-02-03 MED ORDER — FENTANYL CITRATE (PF) 100 MCG/2ML IJ SOLN
INTRAMUSCULAR | Status: DC | PRN
  Administered 2016-02-03: 100 ug via INTRAVENOUS

## 2016-02-03 MED ORDER — FENTANYL CITRATE (PF) 100 MCG/2ML IJ SOLN
INTRAMUSCULAR | Status: DC | PRN
  Administered 2016-02-03: 100 ug via INTRAVENOUS
  Administered 2016-02-03 (×2): 50 ug via INTRAVENOUS

## 2016-02-03 MED ORDER — ACETAMINOPHEN 325 MG PO TABS: 650.0000 mg | ORAL_TABLET | ORAL | Status: DC | PRN

## 2016-02-03 MED ORDER — ASPIRIN EC 81 MG PO TBEC: 81.0000 mg | DELAYED_RELEASE_TABLET | ORAL | Status: DC

## 2016-02-03 SURGICAL SUPPLY — 47 items
APL SKNCLS STERI-STRIP NONHPOA (GAUZE/BANDAGES/DRESSINGS) ×1
BENZOIN TINCTURE PRP APPL 2/3 (GAUZE/BANDAGES/DRESSINGS) ×2 IMPLANT
BLADE CLIPPER SURG (BLADE) ×2 IMPLANT
BUR ACORN 6.0 (BURR) ×2 IMPLANT
BUR MATCHSTICK NEURO 3.0 LAGG (BURR) ×2 IMPLANT
CANISTER SUCT 3000ML PPV (MISCELLANEOUS) ×2 IMPLANT
DRAPE LAPAROTOMY 100X72X124 (DRAPES) ×2 IMPLANT
DRAPE MICROSCOPE LEICA (MISCELLANEOUS) ×2 IMPLANT
DRAPE POUCH INSTRU U-SHP 10X18 (DRAPES) ×2 IMPLANT
DRSG OPSITE POSTOP 3X4 (GAUZE/BANDAGES/DRESSINGS) ×2 IMPLANT
DRSG PAD ABDOMINAL 8X10 ST (GAUZE/BANDAGES/DRESSINGS) IMPLANT
DURAPREP 26ML APPLICATOR (WOUND CARE) ×2 IMPLANT
ELECT REM PT RETURN 9FT ADLT (ELECTROSURGICAL) ×2
ELECTRODE REM PT RTRN 9FT ADLT (ELECTROSURGICAL) ×1 IMPLANT
GAUZE SPONGE 4X4 12PLY STRL (GAUZE/BANDAGES/DRESSINGS) ×2 IMPLANT
GAUZE SPONGE 4X4 16PLY XRAY LF (GAUZE/BANDAGES/DRESSINGS) IMPLANT
GLOVE BIOGEL M 8.0 STRL (GLOVE) ×2 IMPLANT
GLOVE EXAM NITRILE LRG STRL (GLOVE) IMPLANT
GLOVE EXAM NITRILE XL STR (GLOVE) IMPLANT
GLOVE EXAM NITRILE XS STR PU (GLOVE) IMPLANT
GOWN STRL REUS W/ TWL LRG LVL3 (GOWN DISPOSABLE) ×2 IMPLANT
GOWN STRL REUS W/ TWL XL LVL3 (GOWN DISPOSABLE) IMPLANT
GOWN STRL REUS W/TWL 2XL LVL3 (GOWN DISPOSABLE) IMPLANT
GOWN STRL REUS W/TWL LRG LVL3 (GOWN DISPOSABLE) ×4
GOWN STRL REUS W/TWL XL LVL3 (GOWN DISPOSABLE)
KIT BASIN OR (CUSTOM PROCEDURE TRAY) ×2 IMPLANT
KIT ROOM TURNOVER OR (KITS) ×2 IMPLANT
NEEDLE HYPO 18GX1.5 BLUNT FILL (NEEDLE) IMPLANT
NEEDLE HYPO 21X1.5 SAFETY (NEEDLE) IMPLANT
NEEDLE HYPO 25X1 1.5 SAFETY (NEEDLE) IMPLANT
NEEDLE SPNL 20GX3.5 QUINCKE YW (NEEDLE) IMPLANT
NS IRRIG 1000ML POUR BTL (IV SOLUTION) ×2 IMPLANT
PACK LAMINECTOMY NEURO (CUSTOM PROCEDURE TRAY) ×2 IMPLANT
PAD ARMBOARD 7.5X6 YLW CONV (MISCELLANEOUS) ×6 IMPLANT
PATTIES SURGICAL .5 X1 (DISPOSABLE) ×2 IMPLANT
RUBBERBAND STERILE (MISCELLANEOUS) ×4 IMPLANT
SPONGE LAP 4X18 X RAY DECT (DISPOSABLE) IMPLANT
SPONGE SURGIFOAM ABS GEL SZ50 (HEMOSTASIS) ×2 IMPLANT
STRIP CLOSURE SKIN 1/2X4 (GAUZE/BANDAGES/DRESSINGS) ×2 IMPLANT
SUT VIC AB 0 CT1 18XCR BRD8 (SUTURE) ×1 IMPLANT
SUT VIC AB 0 CT1 8-18 (SUTURE) ×2
SUT VIC AB 2-0 CP2 18 (SUTURE) ×2 IMPLANT
SUT VIC AB 3-0 SH 8-18 (SUTURE) ×2 IMPLANT
SYR 5ML LL (SYRINGE) IMPLANT
TOWEL OR 17X24 6PK STRL BLUE (TOWEL DISPOSABLE) ×2 IMPLANT
TOWEL OR 17X26 10 PK STRL BLUE (TOWEL DISPOSABLE) ×2 IMPLANT
WATER STERILE IRR 1000ML POUR (IV SOLUTION) ×2 IMPLANT

## 2016-02-03 NOTE — Op Note (Signed)
NAMEMarland Kitchen  CORNELIS, James Hood NO.:  [id number]  MEDICAL RECORD NO.:  [id number]  LOCATION:  3C09C                        FACILITY:  [facility]  PHYSICIAN:  Leeroy Cha, M.D.   DATE OF BIRTH:  1941-05-11  DATE OF PROCEDURE:  02/03/2016 DATE OF DISCHARGE:  02/03/2016                              OPERATIVE REPORT   PREOPERATIVE DIAGNOSES:  Left L5-S1 herniated disk.  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  Left S1 radiculopathy.  POSTOPERATIVE DIAGNOSES:  Left L5-S1 herniated disk.  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  Left S1 radiculopathy.  PROCEDURE:  Left L5-S1 diskectomy.  Foraminotomy.  Decompression of the L5-S1 nerve root.  Microscope.  SURGEON:  Leeroy Cha, M.D.  CLINICAL HISTORY:  Mr. Dondiego Hood a gentleman who came to my office with his wife complaining of back and left leg pain associated with weakness and burning sensation.  MRI shows spondylosis, but at the L5-1, he has a calcified disk in the left side affecting the L5-S1 nerve root. The patient went home.  He was advised to have a second opinion.  He declined.  He and his wife called, ready for surgery.  The patient knew the risk and benefits.  DESCRIPTION OF PROCEDURE:  The patient was taken to the OR and after intubation, he was positioned in a prone manner.  The back was cleaned with DuraPrep.  Drapes were applied.  Midline incision from L5-S1 was made.  Muscle was retracted.  X-ray showed that indeed we were right at the level of L5-S1.  With the microscope, we started using the drill to remove the upper lamina of L5 and the lower of S1.  Medial facetectomy was done.  A thick calcified ligament was also removed.  We identified the thecal sac.  The patient had quite a bit of fibrosis.  Lysis was accomplished.  Retraction of the thecal sac was made.  Indeed, we found a calcified disk going laterally to medial.  Incision was made using the drill as well as the Kerrison punch and the rongeurs.  We were able  to do a decompression of the thecal sac as well as the L5-S1 nerve root. The disk was quite calcified.  Foraminotomy was done to be more space to the L5-S1 nerve root.  The area was irrigated.  Valsalva maneuver was negative.  Fentanyl and Depo-Medrol were left in the epidural space and the wound was closed with Vicryl and Steri-Strip.          ______________________________ Leeroy Cha, M.D.     EB/MEDQ  D:  02/03/2016  T:  02/03/2016  Job:  [id number]

## 2016-02-03 NOTE — Anesthesia Preprocedure Evaluation (Addendum)
Anesthesia Evaluation  Patient identified by MRN, date of birth, ID band Patient awake    Reviewed: Allergy & Precautions, NPO status , Patient's Chart, lab work & pertinent test results, reviewed documented beta blocker date and time   Airway Mallampati: III  TM Distance: >3 FB     Dental   Pulmonary Current Smoker,    breath sounds clear to auscultation       Cardiovascular hypertension, Pt. on medications and Pt. on home beta blockers  Rhythm:Regular Rate:Normal     Neuro/Psych Anxiety Depression Lumbar herniated disc +Dementia    GI/Hepatic Neg liver ROS, hiatal hernia, PUD, GERD  ,  Endo/Other  diabetes, Type 2, Oral Hypoglycemic Agents  Renal/GU Renal InsufficiencyRenal disease     Musculoskeletal  (+) Arthritis ,   Abdominal   Peds  Hematology  (+) anemia ,   Anesthesia Other Findings   Reproductive/Obstetrics                            Lab Results  Component Value Date   WBC 6.5 01/25/2016   HGB 11.7 (L) 01/25/2016   HCT 36.1 (L) 01/25/2016   MCV 94.5 01/25/2016   PLT 267 01/25/2016   Lab Results  Component Value Date   CREATININE 1.49 01/30/2016   BUN 27 (H) 01/30/2016   NA 134 (L) 01/30/2016   K 4.8 01/30/2016   CL 99 01/30/2016   CO2 28 01/30/2016    Anesthesia Physical Anesthesia Plan  ASA: II  Anesthesia Plan: General   Post-op Pain Management:    Induction: Intravenous  Airway Management Planned: Oral ETT  Additional Equipment:   Intra-op Plan:   Post-operative Plan: Extubation in OR  Informed Consent: I have reviewed the patients History and Physical, chart, labs and discussed the procedure including the risks, benefits and alternatives for the proposed anesthesia with the patient or authorized representative who has indicated his/her understanding and acceptance.   Dental advisory given  Plan Discussed with: CRNA  Anesthesia Plan Comments:          Anesthesia Quick Evaluation

## 2016-02-03 NOTE — Transfer of Care (Signed)
Immediate Anesthesia Transfer of Care Note  Patient: James Hood  Procedure(s) Performed: Procedure(s) with comments: LEFT L5-S1 DISKECTOMY (Left) - LEFT L5-S1 DISKECTOMY  Patient Location: PACU  Anesthesia Type:General  Level of Consciousness: awake, alert , oriented and patient cooperative  Airway & Oxygen Therapy: Patient Spontanous Breathing  Post-op Assessment: Report given to RN and Post -op Vital signs reviewed and stable  Post vital signs: Reviewed and stable  Last Vitals:  Vitals:   02/03/16 0835  BP: 101/62  Pulse: 79  Resp: 20  Temp: 36.7 C    Last Pain:  Vitals:   02/03/16 0835  TempSrc: Oral         Complications: No apparent anesthesia complications

## 2016-02-03 NOTE — Evaluation (Signed)
Physical Therapy Evaluation Patient Details Name: James Hood MRN: [id number] DOB: Oct 17, 1940 Today's Date: 02/03/2016   History of Present Illness  patient is a 75 yo male s/p spinal surgery (PLIF)  Clinical Impression  Patient seen for mobility assessment and education s/p spinal surgery. Patient mobilizing well, performed stair negotiation and was receptive to educated re: precautions, mobility expectations, safety, and car transfers. No further acute PT needs, will sign off.    Follow Up Recommendations No PT follow up;Supervision - Intermittent    Equipment Recommendations  None recommended by PT    Recommendations for Other Services       Precautions / Restrictions Precautions Precautions: Back Precaution Booklet Issued: Yes (comment) Precaution Comments: hand out reviewed and provided to patient      Mobility  Bed Mobility Overal bed mobility: Modified Independent             General bed mobility comments: increased time to perform, initial instruction given then patient able to carry out without assist or cues  Transfers Overall transfer level: Needs assistance Equipment used: None Transfers: Sit to/from Stand Sit to Stand: Supervision         General transfer comment: no physical assist, supervision for safety  Ambulation/Gait Ambulation/Gait assistance: Supervision Ambulation Distance (Feet): 340 Feet Assistive device: None Gait Pattern/deviations: Antalgic Gait velocity: decreased Gait velocity interpretation: Below normal speed for age/gender General Gait Details: modest antalgic gait with one balance check secondary to hip pain, no physical assiat required, VCs for increased cadence  Stairs Stairs: Yes Stairs assistance: Supervision Stair Management: Forwards;One rail Left Number of Stairs: 5 General stair comments: VCs for sequencing and technique, no physical assist  Wheelchair Mobility    Modified Rankin (Stroke Patients Only)       Balance Overall balance assessment: No apparent balance deficits (not formally assessed)                                           Pertinent Vitals/Pain Pain Assessment: 0-10 Pain Score: 7  Pain Location: low back, hip Pain Descriptors / Indicators: Sore Pain Intervention(s): Monitored during session    Home Living Family/patient expects to be discharged to:: Private residence Living Arrangements: Spouse/significant other Available Help at Discharge: Family;Available PRN/intermittently Type of Home: House Home Access: Stairs to enter Entrance Stairs-Rails: Left Entrance Stairs-Number of Steps: 3 Home Layout: Multi-level Home Equipment: Walker - 2 wheels;Cane - single point;Bedside commode      Prior Function Level of Independence: Independent               Hand Dominance   Dominant Hand: Right    Extremity/Trunk Assessment   Upper Extremity Assessment: Overall WFL for tasks assessed           Lower Extremity Assessment: LLE deficits/detail   LLE Deficits / Details: modest assymetrical strength deficits dorsiflexion ROM     Communication   Communication: No difficulties  Cognition Arousal/Alertness: Awake/alert Behavior During Therapy: WFL for tasks assessed/performed Overall Cognitive Status: Within Functional Limits for tasks assessed                      General Comments General comments (skin integrity, edema, etc.): educated on car transfers, stair negotiation, mobility expectations and precautions.     Exercises     Assessment/Plan    PT Assessment Patent does not need any further PT services  PT Problem List            PT Treatment Interventions      PT Goals (Current goals can be found in the Care Plan section)  Acute Rehab PT Goals PT Goal Formulation: All assessment and education complete, DC therapy    Frequency     Barriers to discharge        Co-evaluation               End of  Session Equipment Utilized During Treatment: Gait belt Activity Tolerance: Patient tolerated treatment well Patient left: in bed;with family/visitor present;with call bell/phone within reach Nurse Communication: Mobility status    Functional Assessment Tool Used: clinical judgement Functional Limitation: Mobility: Walking and moving around Mobility: Walking and Moving Around Current Status [id number]): At least 1 percent but less than 20 percent impaired, limited or restricted Mobility: Walking and Moving Around Goal Status [phone]): At least 1 percent but less than 20 percent impaired, limited or restricted Mobility: Walking and Moving Around Discharge Status [phone]): At least 1 percent but less than 20 percent impaired, limited or restricted    Time: [id number] PT Time Calculation (min) (ACUTE ONLY): 17 min   Charges:   PT Evaluation $PT Eval Low Complexity: 1 Procedure     PT G Codes:   PT G-Codes **NOT FOR INPATIENT CLASS** Functional Assessment Tool Used: clinical judgement Functional Limitation: Mobility: Walking and moving around Mobility: Walking and Moving Around Current Status [id number]): At least 1 percent but less than 20 percent impaired, limited or restricted Mobility: Walking and Moving Around Goal Status [phone]): At least 1 percent but less than 20 percent impaired, limited or restricted Mobility: Walking and Moving Around Discharge Status [phone]): At least 1 percent but less than 20 percent impaired, limited or restricted    Duncan Dull 02/03/2016, 2:39 PM Alben Deeds, [place] DPT  [phone]

## 2016-02-03 NOTE — Anesthesia Postprocedure Evaluation (Signed)
Anesthesia Post Note  Patient: James Hood  Procedure(s) Performed: Procedure(s) (LRB): LEFT L5-S1 DISKECTOMY (Left)  Patient location during evaluation: PACU Anesthesia Type: General Level of consciousness: awake and alert Pain management: pain level controlled Vital Signs Assessment: post-procedure vital signs reviewed and stable Respiratory status: spontaneous breathing, nonlabored ventilation, respiratory function stable and patient connected to nasal cannula oxygen Cardiovascular status: blood pressure returned to baseline and stable Postop Assessment: no signs of nausea or vomiting Anesthetic complications: no    Last Vitals:  Vitals:   02/03/16 1321 02/03/16 1341  BP:  98/79  Pulse:  75  Resp:  16  Temp: 36.1 C 36.4 C    Last Pain:  Vitals:   02/03/16 1321  TempSrc:   PainSc: 3                  Tiajuana Amass

## 2016-02-06 ENCOUNTER — Encounter (HOSPITAL_COMMUNITY): Payer: Self-pay | Admitting: Neurosurgery

## 2016-02-07 NOTE — Discharge Summary (Signed)
Physician Discharge Summary  Patient ID: James Hood MRN: [id number] DOB/AGE: 01/18/1941 75 y.o.  Admit date: 02/03/2016 Discharge date: 02/07/2016  Admission Diagnoses:left lumbar herniated disc  Discharge Diagnoses:  Active Problems:   Lumbar herniated disc   Discharged Condition: no pain  Hospital Course: surgery  Consults: none  Significant Diagnostic Studies:mti  Treatments: left l5s1 discectomy  Discharge Exam: Blood pressure 110/72, pulse 71, temperature 97.6 F (36.4 C), resp. rate 16, height 5\' 7"  (1.702 m), weight 87.1 kg (192 lb), SpO2 94 %. No pain, no weakness. Patient was discharge less the 12 hours after surgery  Disposition: home at the care of family     Medication List    ASK your doctor about these medications   acetaminophen 500 MG tablet Commonly known as:  TYLENOL Take 1,000 mg by mouth 2 (two) times daily.   aspirin EC 81 MG tablet Take 81 mg by mouth every morning.   buPROPion 150 MG 12 hr tablet Commonly known as:  ZYBAN Take 150 mg by mouth 2 (two) times daily.   clorazepate 3.75 MG tablet Commonly known as:  TRANXENE Take 1 tablet (3.75 mg total) by mouth at bedtime as needed for anxiety.   colestipol 1 g tablet Commonly known as:  COLESTID Take 2 g by mouth daily.   cyclobenzaprine 10 MG tablet Commonly known as:  FLEXERIL Take 10 mg by mouth 3 (three) times daily as needed for muscle spasms.   diphenoxylate-atropine 2.5-0.025 MG tablet Commonly known as:  LOMOTIL Take 1-2 tablets by mouth 2 (two) times daily. Take 2 tablets in the morning, and 1 tablet in the evening   divalproex 250 MG 24 hr tablet Commonly known as:  DEPAKOTE ER Take 250 mg by mouth 2 (two) times daily. Pt takes 250mg  in the morning, and 250mg  in the afternoon   donepezil 10 MG tablet Commonly known as:  ARICEPT Take 10 mg by mouth at bedtime.   fenofibrate 145 MG tablet Commonly known as:  TRICOR Take 145 mg by mouth daily.   finasteride 5  MG tablet Commonly known as:  PROSCAR Take 1 tablet (5 mg total) by mouth daily.   gabapentin 600 MG tablet Commonly known as:  NEURONTIN Take 1,200 mg by mouth 3 (three) times daily.   glimepiride 4 MG tablet Commonly known as:  AMARYL Take 4 mg by mouth 2 (two) times daily.   lisinopril-hydrochlorothiazide 20-25 MG tablet Commonly known as:  PRINZIDE,ZESTORETIC Take 1 tablet by mouth 2 (two) times daily.   memantine 5 MG tablet Commonly known as:  NAMENDA Take 5 mg by mouth 2 (two) times daily.   metFORMIN 500 MG tablet Commonly known as:  GLUCOPHAGE Take 1,000 mg by mouth 2 (two) times daily with a meal.   metoprolol tartrate 25 MG tablet Commonly known as:  LOPRESSOR Take 25 mg by mouth 2 (two) times daily.   omeprazole 20 MG tablet Commonly known as:  PRILOSEC OTC Take 20 mg by mouth 2 (two) times daily.   primidone 50 MG tablet Commonly known as:  MYSOLINE Take 150-200 mg by mouth See admin instructions. Pt takes 200mg  in the morning, and 150mg  in the evening   propranolol 60 MG tablet Commonly known as:  INDERAL Take 60 mg by mouth 2 (two) times daily.   QUEtiapine 25 MG tablet Commonly known as:  SEROQUEL Take 75 mg by mouth at bedtime.   simvastatin 40 MG tablet Commonly known as:  ZOCOR Take 1 tablet (40 mg  total) by mouth daily.   tamsulosin 0.4 MG Caps capsule Commonly known as:  FLOMAX Take 0.4 mg by mouth daily after supper.   traMADol 50 MG tablet Commonly known as:  ULTRAM Take 50 mg by mouth every 6 (six) hours as needed for moderate pain.        Signed: Floyce Stakes 02/07/2016, 4:50 PM

## 2016-02-09 ENCOUNTER — Other Ambulatory Visit: Payer: Self-pay | Admitting: Family Medicine

## 2016-02-09 NOTE — Telephone Encounter (Signed)
This is a historical medication. Pt last seen on 12/09/15. Last A1C 01/25/16 6.2

## 2016-02-09 NOTE — Telephone Encounter (Signed)
Fenofibrate also a historical medication.

## 2016-02-10 MED ORDER — METFORMIN HCL 500 MG PO TABS: 1000.0000 mg | ORAL_TABLET | Freq: Two times a day (BID) | ORAL | 3 refills | Status: DC

## 2016-02-10 NOTE — Telephone Encounter (Signed)
Metformin sent to pharmacy. Please call the patient and confirm that he is taking fenofibrate. Thanks.

## 2016-02-13 NOTE — Telephone Encounter (Signed)
Pt was called and stated that he is taking fenofibrate.

## 2016-02-14 MED ORDER — FENOFIBRATE 145 MG PO TABS: 145.0000 mg | ORAL_TABLET | Freq: Every day | ORAL | 1 refills | Status: DC

## 2016-02-14 NOTE — Telephone Encounter (Signed)
Fenofibrate sent to pharmacy 

## 2016-02-14 NOTE — Addendum Note (Signed)
Addended by: Caryl Bis, Tayshaun Kroh G on: 02/14/2016 12:08 PM   Modules accepted: Orders

## 2016-02-27 ENCOUNTER — Other Ambulatory Visit: Payer: Self-pay | Admitting: Family Medicine

## 2016-03-13 ENCOUNTER — Encounter: Payer: Self-pay | Admitting: Family Medicine

## 2016-03-13 ENCOUNTER — Ambulatory Visit (INDEPENDENT_AMBULATORY_CARE_PROVIDER_SITE_OTHER): Payer: PPO | Admitting: Family Medicine

## 2016-03-13 VITALS — BP 120/70 | HR 67 | Wt 195.0 lb

## 2016-03-13 DIAGNOSIS — E781 Pure hyperglyceridemia: Secondary | ICD-10-CM | POA: Diagnosis not present

## 2016-03-13 DIAGNOSIS — E785 Hyperlipidemia, unspecified: Secondary | ICD-10-CM

## 2016-03-13 DIAGNOSIS — E1142 Type 2 diabetes mellitus with diabetic polyneuropathy: Secondary | ICD-10-CM

## 2016-03-13 DIAGNOSIS — M5126 Other intervertebral disc displacement, lumbar region: Secondary | ICD-10-CM

## 2016-03-13 DIAGNOSIS — Z23 Encounter for immunization: Secondary | ICD-10-CM

## 2016-03-13 LAB — COMPREHENSIVE METABOLIC PANEL
ALT: 20 U/L (ref 0–53)
AST: 21 U/L (ref 0–37)
Albumin: 4.3 g/dL (ref 3.5–5.2)
Alkaline Phosphatase: 47 U/L (ref 39–117)
BUN: 30 mg/dL — ABNORMAL HIGH (ref 6–23)
CO2: 27 mEq/L (ref 19–32)
Calcium: 9.9 mg/dL (ref 8.4–10.5)
Chloride: 100 mEq/L (ref 96–112)
Creatinine, Ser: 1.27 mg/dL (ref 0.40–1.50)
GFR: 58.58 mL/min — ABNORMAL LOW (ref >60.00)
Glucose, Bld: 151 mg/dL — ABNORMAL HIGH (ref 70–99)
Potassium: 4.4 mEq/L (ref 3.5–5.1)
Sodium: 137 mEq/L (ref 135–145)
Total Bilirubin: 0.3 mg/dL (ref 0.2–1.2)
Total Protein: 6.3 g/dL (ref 6.0–8.3)

## 2016-03-13 MED ORDER — TRAMADOL HCL 50 MG PO TABS: 100.0000 mg | ORAL_TABLET | Freq: Three times a day (TID) | ORAL | 1 refills | Status: DC | PRN

## 2016-03-13 NOTE — Assessment & Plan Note (Signed)
Recently underwent surgery for this. No surgical pain though continues to have sciatica symptoms. Not well controlled with current regimen. Given his psychiatric medications we are somewhat limited in medication management for this. We will increase his tramadol to 100 mg 3 times a day and combine this with Tylenol 1000 mg 3 times a day. If this is not beneficial he will let us know.

## 2016-03-13 NOTE — Assessment & Plan Note (Signed)
A1c at goal on last check. He'll continue his current medication regimen. We'll check an A1c at his next office visit.

## 2016-03-13 NOTE — Progress Notes (Signed)
Tommi Rumps, MD Phone: [phone]  James Hood is a 75 y.o. male who presents today for f/u.  DIABETES Disease Monitoring: Blood Sugar ranges-not checking, last A1c 6.2 Polyuria/phagia/dipsia- no      ophthalmology- states he saw on January Medications: Compliance- taking metformin and Amaryl Hypoglycemic symptoms- no  HYPERLIPIDEMIA Symptoms Chest pain on exertion:  No   Leg claudication:   No Medications: Compliance- taking simvastatin Right upper quadrant pain- no  Muscle aches- no  Back pain: Notes this is chronic. He had a left L5-S1 discectomy in September. 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 Notes at times it feels as though his leg will give out on him with this. No numbness. Has been taking 50 mg of tramadol every 8 hours as needed. That's not beneficial. Also taking aspirin in combination with Tylenol and caffeine that is some benefit. No incontinence, saddle anesthesia, or fevers.   PMH: smoker   ROS see history of present illness  Objective  Physical Exam Vitals:   03/13/16 0829  BP: 120/70  Pulse: 67    BP Readings from Last 3 Encounters:  03/13/16 120/70  02/03/16 110/72  01/27/16 100/68   Wt Readings from Last 3 Encounters:  03/13/16 195 lb (88.5 kg)  02/03/16 192 lb (87.1 kg)  01/27/16 192 lb (87.1 kg)    Physical Exam  Constitutional: No distress.  Cardiovascular: Normal rate, regular rhythm and normal heart sounds.   Pulmonary/Chest: Effort normal and breath sounds normal.  Musculoskeletal: He exhibits no edema.  No midline spine tenderness, no midline spine step-off, no muscular back tenderness, there is a well-healed midline lumbar scar with no surrounding erythema or tenderness  Neurological: He is alert. Gait normal.  5 out of 5 strength bilateral quads, hamstrings, plantar flexion, and dorsiflexion, sensation to light touch intact bilateral lower extremities,  absent patellar reflexes bilaterally  Skin: He is not diaphoretic.   Diabetic Foot Exam - Simple   Simple Foot Form Diabetic Foot exam was performed with the following findings:  Yes 03/13/2016  9:13 AM  Visual Inspection No deformities, no ulcerations, no other skin breakdown bilaterally:  Yes Sensation Testing Pulse Check Posterior Tibialis and Dorsalis pulse intact bilaterally:  Yes Comments Decreased monofilament testing right foot over the toes, otherwise monofilament and light touch intact     Assessment/Plan: Please see individual problem list.  Type 2 diabetes mellitus (HCC) A1c at goal on last check. He'll continue his current medication regimen. We'll check an A1c at his next office visit.  Hypertriglyceridemia On fenofibrate and statin. We will continue these. We'll check a CMP.  Lumbar herniated disc Recently underwent surgery for this. No surgical pain though continues to have sciatica symptoms. Not well controlled with current regimen. Given his psychiatric medications we are somewhat limited in medication management for this. We will increase his tramadol to 100 mg 3 times a day and combine this with Tylenol 1000 mg 3 times a day. If this is not beneficial he will let us know.   Orders Placed This Encounter  Procedures  . Flu Vaccine QUAD 36+ mos IM  . Comp Met (CMET)    Meds ordered this encounter  Medications  . traMADol (ULTRAM) 50 MG tablet    Sig: Take 2 tablets (100 mg total) by mouth every 8 (eight) hours as needed for moderate pain.    Dispense:  30 tablet    Refill:  1  Tommi Rumps, MD [place]

## 2016-03-13 NOTE — Patient Instructions (Signed)
Nice to see you. We are going to to increase your tramadol to 100 mg every 8 hours. You can take this in combination with Tylenol 1000 mg every 8 hours. Please continue diabetic medications. If you develop numbness, weakness, loss of bowel or bladder function, numbness between her legs, or any new or changing symptoms please seek medical attention immediately.

## 2016-03-13 NOTE — Assessment & Plan Note (Signed)
On fenofibrate and statin. We will continue these. We'll check a CMP.

## 2016-03-14 DIAGNOSIS — M5416 Radiculopathy, lumbar region: Secondary | ICD-10-CM | POA: Diagnosis not present

## 2016-03-14 DIAGNOSIS — M5116 Intervertebral disc disorders with radiculopathy, lumbar region: Secondary | ICD-10-CM | POA: Diagnosis not present

## 2016-03-14 DIAGNOSIS — M545 Low back pain: Secondary | ICD-10-CM | POA: Diagnosis not present

## 2016-03-28 ENCOUNTER — Other Ambulatory Visit: Payer: Self-pay | Admitting: Family Medicine

## 2016-03-28 ENCOUNTER — Encounter: Payer: Self-pay | Admitting: Family Medicine

## 2016-03-28 ENCOUNTER — Ambulatory Visit (INDEPENDENT_AMBULATORY_CARE_PROVIDER_SITE_OTHER): Payer: PPO | Admitting: Family Medicine

## 2016-03-28 VITALS — BP 128/86 | HR 88 | Temp 98.4°F | Wt 200.0 lb

## 2016-03-28 DIAGNOSIS — M5442 Lumbago with sciatica, left side: Secondary | ICD-10-CM

## 2016-03-28 DIAGNOSIS — M5126 Other intervertebral disc displacement, lumbar region: Secondary | ICD-10-CM | POA: Diagnosis not present

## 2016-03-28 DIAGNOSIS — G8929 Other chronic pain: Secondary | ICD-10-CM | POA: Diagnosis not present

## 2016-03-28 MED ORDER — HYDROCODONE-ACETAMINOPHEN 5-325 MG PO TABS: 1.0000 | ORAL_TABLET | Freq: Four times a day (QID) | ORAL | 0 refills | Status: DC | PRN

## 2016-03-28 NOTE — Progress Notes (Signed)
Pre visit review using our clinic review tool, if applicable. No additional management support is needed unless otherwise documented below in the visit note. 

## 2016-03-28 NOTE — Progress Notes (Signed)
Tommi Rumps, MD Phone: [phone]  James Hood is a 75 y.o. male who presents today for same-day visit.  Low back pain: Patient has chronic low back pain though most of his discomfort is left-sided sciatica. Notes the sciatica is severe at times. Typically 6-7 out of 10 though occasionally 10 out of 10. No numbness or weakness. Notes he had stool incontinence 2-3 days ago when he woke up in the morning and noted he had a bowel movement without realizing it. No incontinence since then. No urine incontinence. No saddle anesthesia. No fevers. Has tried tramadol and Tylenol as advised after his last visit with little benefit. Taking gabapentin max dose. Also taking ibuprofen which is somewhat beneficial. He had an epidural about 2 weeks ago that did not provide any relief.  PMH: Recent history of L5-S1 discectomy   ROS see history of present illness  Objective  Physical Exam Vitals:   03/28/16 0827  BP: 128/86  Pulse: 88  Temp: 98.4 F (36.9 C)    BP Readings from Last 3 Encounters:  03/28/16 128/86  03/13/16 120/70  02/03/16 110/72   Wt Readings from Last 3 Encounters:  03/28/16 200 lb (90.7 kg)  03/13/16 195 lb (88.5 kg)  02/03/16 192 lb (87.1 kg)    Physical Exam  Constitutional: No distress.  Cardiovascular: Normal rate, regular rhythm and normal heart sounds.   Pulmonary/Chest: Effort normal and breath sounds normal.  Musculoskeletal:  No midline spine tenderness, no midline spine step-off, no muscular back tenderness, small lumbar spine midline scar appears well-healed with no erythema  Neurological: He is alert. Gait normal.  5 out of 5 strength bilateral quads, hamstrings, plantar flexion, and dorsiflexion, sensation to light touch intact bilateral lower extremities, absent patellar reflexes, normal rectal tone  Skin: Skin is warm and dry. He is not diaphoretic.     Assessment/Plan: Please see individual problem list.  Lumbar herniated disc Patient  continues to have issues with back pain and sciatica. He had a recent episode of bowel incontinence while sleeping. Normal neurological exam today with normal rectal tone. Bowel incontinence is concerning to me. This in combination with his back pain and sciatica are worrisome for a compressive issue. I discussed the need for MRI today and advised that this needs to be done as soon as possible, though patient would have to drive to [facility] for this at 9 PM to be done as an outpatient. I discussed having him go to the emergency room though he declined this given potential insurance costs and opted to wait until tomorrow when he can have this done locally. I advised that if he has any change in symptoms, worsening pain, numbness, weakness, bowel or bladder incontinence, or saddle anesthesia he needs to seek medical attention immediately. We will treat his pain with a very short course of Vicodin. I advised that this is not a long-term medication. We will refer him to pain management as well.   Orders Placed This Encounter  Procedures  . MR Lumbar Spine Wo Contrast    Standing Status:   Future    Standing Expiration Date:   05/28/2017    Order Specific Question:   Reason for Exam (SYMPTOM  OR DIAGNOSIS REQUIRED)    Answer:   chronic low back pain, worsening sciatica, bowel incontinence 3 days ago    Order Specific Question:   Preferred imaging location?    Answer:   [facility] (table limit-300lbs)    Order Specific Question:   What  is the patient's sedation requirement?    Answer:   No Sedation    Order Specific Question:   Does the patient have a pacemaker or implanted devices?    Answer:   No    Order Specific Question:   Call Results- Best Contact Number?    Answer:   [phone]   please hold patient     Meds ordered this encounter  Medications  . HYDROcodone-acetaminophen (NORCO/VICODIN) 5-325 MG tablet    Sig: Take 1 tablet by mouth every 6 (six) hours as needed for moderate pain.      Dispense:  10 tablet    Refill:  0   Tommi Rumps, MD [place]

## 2016-03-28 NOTE — Patient Instructions (Signed)
Nice to see you. We will refer you to pain management for further evaluation of your back pain and sciatica. We will obtain an MRI of your lumbar spine today to evaluate for a cause of your stool incontinence. If you develop numbness, weakness, further loss of bowel or bladder function, fevers, or any new or changing symptoms please seek medical attention immediately.

## 2016-03-28 NOTE — Assessment & Plan Note (Addendum)
Patient continues to have issues with back pain and sciatica. He had a recent episode of bowel incontinence while sleeping. Normal neurological exam today with normal rectal tone. Bowel incontinence is concerning to me. This in combination with his back pain and sciatica are worrisome for a compressive issue. I discussed the need for MRI today and advised that this needs to be done as soon as possible, though patient would have to drive to [facility] for this at 9 PM to be done as an outpatient. I discussed having him go to the emergency room though he declined this given potential insurance costs and opted to wait until tomorrow when he can have this done locally. I advised that if he has any change in symptoms, worsening pain, numbness, weakness, bowel or bladder incontinence, or saddle anesthesia he needs to seek medical attention immediately. We will treat his pain with a very short course of Vicodin. I advised that this is not a long-term medication. We will refer him to pain management as well.

## 2016-03-29 ENCOUNTER — Telehealth: Payer: Self-pay | Admitting: *Deleted

## 2016-03-29 ENCOUNTER — Ambulatory Visit
Admission: RE | Admit: 2016-03-29 | Discharge: 2016-03-29 | Disposition: A | Discharge status: Home / Self Care | Payer: PPO | Source: Ambulatory Visit | Attending: Family Medicine | Admitting: Family Medicine

## 2016-03-29 DIAGNOSIS — Z9889 Other specified postprocedural states: Secondary | ICD-10-CM | POA: Insufficient documentation

## 2016-03-29 DIAGNOSIS — M4306 Spondylolysis, lumbar region: Secondary | ICD-10-CM | POA: Diagnosis not present

## 2016-03-29 DIAGNOSIS — M5442 Lumbago with sciatica, left side: Secondary | ICD-10-CM | POA: Insufficient documentation

## 2016-03-29 DIAGNOSIS — G8929 Other chronic pain: Secondary | ICD-10-CM

## 2016-03-29 NOTE — Telephone Encounter (Signed)
Pt requested a call in reference to being referred to a pain clinic to help manage his pain, and also the continue of care after receiving his MRI results on CD. Pt contact [phone]

## 2016-03-30 MED ORDER — TRAMADOL HCL 50 MG PO TABS: 50.0000 mg | ORAL_TABLET | Freq: Four times a day (QID) | ORAL | 0 refills | Status: DC | PRN

## 2016-03-30 NOTE — Telephone Encounter (Signed)
Please see if the Norco is actually helping him. We did discuss that this would only be a short-term medication and that he would need to see pain management for further management of his pain. If it is not helping him we will not refill it. If it is we can refill for a very short period of time and there'll be no further refills.

## 2016-03-30 NOTE — Telephone Encounter (Signed)
Tried to call patient back. Please call Roselyn Reef if he calls back

## 2016-03-30 NOTE — Telephone Encounter (Signed)
Pt called back returning your call. Thank you!  Call pt @[phone]

## 2016-03-30 NOTE — Telephone Encounter (Signed)
Spoke with patient and he stated that the Norco is not helping with the pain. Patient stated that the only relief he gets is when he takes the Tramadol, tylenol, and ibuprofen together. After speaking with Dr. Caryl Bis he stated that the patient could continue with this regimen, but needs to take ibuprofen with food.

## 2016-03-30 NOTE — Telephone Encounter (Signed)
Refill printed   Please fax to pharmacy

## 2016-03-30 NOTE — Telephone Encounter (Signed)
Patient has called the neurosurgeon to schedule an appointment. He is still having a lot of pain and only has four tablets left of the Norco. He is wanting to know if you can prescribe him enough to get to the appointment with the neurosurgeon. He is going to call back when it is scheduled to let us know.

## 2016-03-30 NOTE — Telephone Encounter (Signed)
RX faxed to pharmacy.

## 2016-03-30 NOTE — Telephone Encounter (Signed)
Notified patient to continue regimen. He stated that he needs a refill on Tramadol.

## 2016-03-31 ENCOUNTER — Other Ambulatory Visit: Payer: Self-pay | Admitting: Family Medicine

## 2016-04-02 ENCOUNTER — Other Ambulatory Visit: Payer: Self-pay | Admitting: Family Medicine

## 2016-04-02 DIAGNOSIS — M545 Low back pain: Secondary | ICD-10-CM | POA: Diagnosis not present

## 2016-04-03 DIAGNOSIS — M47817 Spondylosis without myelopathy or radiculopathy, lumbosacral region: Secondary | ICD-10-CM | POA: Diagnosis not present

## 2016-04-03 DIAGNOSIS — M5116 Intervertebral disc disorders with radiculopathy, lumbar region: Secondary | ICD-10-CM | POA: Diagnosis not present

## 2016-04-03 DIAGNOSIS — M545 Low back pain: Secondary | ICD-10-CM | POA: Diagnosis not present

## 2016-04-06 DIAGNOSIS — M545 Low back pain: Secondary | ICD-10-CM | POA: Diagnosis not present

## 2016-04-06 DIAGNOSIS — M47817 Spondylosis without myelopathy or radiculopathy, lumbosacral region: Secondary | ICD-10-CM | POA: Diagnosis not present

## 2016-04-09 ENCOUNTER — Telehealth: Payer: Self-pay | Admitting: Family Medicine

## 2016-04-09 MED ORDER — SIMVASTATIN 40 MG PO TABS: 40.0000 mg | ORAL_TABLET | Freq: Every day | ORAL | 1 refills | Status: DC

## 2016-04-09 NOTE — Telephone Encounter (Signed)
Pt called about needing a Rx for simvastatin (ZOCOR) 40 MG tablet.   Pharmacy is [facility], [place]  Call pt @ [phone]. Thank you!

## 2016-04-11 ENCOUNTER — Other Ambulatory Visit: Payer: Self-pay | Admitting: Family Medicine

## 2016-04-23 ENCOUNTER — Other Ambulatory Visit: Payer: Self-pay | Admitting: Surgical

## 2016-04-23 MED ORDER — TRAMADOL HCL 50 MG PO TABS: 50.0000 mg | ORAL_TABLET | Freq: Four times a day (QID) | ORAL | 0 refills | Status: DC | PRN

## 2016-04-23 NOTE — Telephone Encounter (Signed)
RX faxed

## 2016-04-23 NOTE — Telephone Encounter (Signed)
Please fax to pharmacy

## 2016-04-23 NOTE — Telephone Encounter (Signed)
Patient is requesting  a refill of Tramadol. Is it ok to refill

## 2016-04-26 ENCOUNTER — Other Ambulatory Visit: Payer: Self-pay | Admitting: Family Medicine

## 2016-04-29 ENCOUNTER — Other Ambulatory Visit: Payer: Self-pay | Admitting: Family Medicine

## 2016-04-30 DIAGNOSIS — M545 Low back pain: Secondary | ICD-10-CM | POA: Diagnosis not present

## 2016-04-30 DIAGNOSIS — M5116 Intervertebral disc disorders with radiculopathy, lumbar region: Secondary | ICD-10-CM | POA: Diagnosis not present

## 2016-04-30 NOTE — Telephone Encounter (Signed)
Patient stated that he picked up oxycodone from surgeon. He stated that he should not need Tramadol.

## 2016-05-02 DIAGNOSIS — F418 Other specified anxiety disorders: Secondary | ICD-10-CM | POA: Diagnosis not present

## 2016-05-02 DIAGNOSIS — Z720 Tobacco use: Secondary | ICD-10-CM | POA: Diagnosis not present

## 2016-05-02 DIAGNOSIS — F0280 Dementia in other diseases classified elsewhere without behavioral disturbance: Secondary | ICD-10-CM | POA: Diagnosis not present

## 2016-05-02 DIAGNOSIS — G301 Alzheimer's disease with late onset: Secondary | ICD-10-CM | POA: Diagnosis not present

## 2016-05-02 DIAGNOSIS — G479 Sleep disorder, unspecified: Secondary | ICD-10-CM | POA: Diagnosis not present

## 2016-05-02 DIAGNOSIS — G250 Essential tremor: Secondary | ICD-10-CM | POA: Diagnosis not present

## 2016-05-03 ENCOUNTER — Telehealth: Payer: Self-pay | Admitting: *Deleted

## 2016-05-03 ENCOUNTER — Other Ambulatory Visit: Payer: Self-pay | Admitting: Family Medicine

## 2016-05-03 ENCOUNTER — Encounter: Payer: Self-pay | Admitting: Anesthesiology

## 2016-05-03 ENCOUNTER — Ambulatory Visit: Payer: PPO | Attending: Anesthesiology | Admitting: Anesthesiology

## 2016-05-03 VITALS — BP 144/84 | HR 101 | Temp 98.0°F | Resp 16 | Ht 71.0 in | Wt 195.0 lb

## 2016-05-03 DIAGNOSIS — I10 Essential (primary) hypertension: Secondary | ICD-10-CM | POA: Diagnosis not present

## 2016-05-03 DIAGNOSIS — M5386 Other specified dorsopathies, lumbar region: Secondary | ICD-10-CM

## 2016-05-03 DIAGNOSIS — E7800 Pure hypercholesterolemia, unspecified: Secondary | ICD-10-CM | POA: Diagnosis not present

## 2016-05-03 DIAGNOSIS — F17210 Nicotine dependence, cigarettes, uncomplicated: Secondary | ICD-10-CM | POA: Insufficient documentation

## 2016-05-03 DIAGNOSIS — Z8719 Personal history of other diseases of the digestive system: Secondary | ICD-10-CM | POA: Insufficient documentation

## 2016-05-03 DIAGNOSIS — Z8249 Family history of ischemic heart disease and other diseases of the circulatory system: Secondary | ICD-10-CM | POA: Diagnosis not present

## 2016-05-03 DIAGNOSIS — M5442 Lumbago with sciatica, left side: Secondary | ICD-10-CM | POA: Diagnosis not present

## 2016-05-03 DIAGNOSIS — F419 Anxiety disorder, unspecified: Secondary | ICD-10-CM | POA: Diagnosis not present

## 2016-05-03 DIAGNOSIS — Z7984 Long term (current) use of oral hypoglycemic drugs: Secondary | ICD-10-CM | POA: Diagnosis not present

## 2016-05-03 DIAGNOSIS — G250 Essential tremor: Secondary | ICD-10-CM | POA: Insufficient documentation

## 2016-05-03 DIAGNOSIS — Z86010 Personal history of colonic polyps: Secondary | ICD-10-CM | POA: Diagnosis not present

## 2016-05-03 DIAGNOSIS — F329 Major depressive disorder, single episode, unspecified: Secondary | ICD-10-CM | POA: Diagnosis not present

## 2016-05-03 DIAGNOSIS — Z96651 Presence of right artificial knee joint: Secondary | ICD-10-CM | POA: Insufficient documentation

## 2016-05-03 DIAGNOSIS — Z79891 Long term (current) use of opiate analgesic: Secondary | ICD-10-CM | POA: Diagnosis not present

## 2016-05-03 DIAGNOSIS — E0590 Thyrotoxicosis, unspecified without thyrotoxic crisis or storm: Secondary | ICD-10-CM | POA: Insufficient documentation

## 2016-05-03 DIAGNOSIS — F0390 Unspecified dementia without behavioral disturbance: Secondary | ICD-10-CM | POA: Insufficient documentation

## 2016-05-03 DIAGNOSIS — Z7982 Long term (current) use of aspirin: Secondary | ICD-10-CM | POA: Insufficient documentation

## 2016-05-03 DIAGNOSIS — Z981 Arthrodesis status: Secondary | ICD-10-CM | POA: Diagnosis not present

## 2016-05-03 DIAGNOSIS — R51 Headache: Secondary | ICD-10-CM | POA: Diagnosis not present

## 2016-05-03 DIAGNOSIS — E119 Type 2 diabetes mellitus without complications: Secondary | ICD-10-CM | POA: Insufficient documentation

## 2016-05-03 DIAGNOSIS — M539 Dorsopathy, unspecified: Secondary | ICD-10-CM | POA: Diagnosis not present

## 2016-05-03 DIAGNOSIS — K219 Gastro-esophageal reflux disease without esophagitis: Secondary | ICD-10-CM | POA: Insufficient documentation

## 2016-05-03 DIAGNOSIS — G8929 Other chronic pain: Secondary | ICD-10-CM | POA: Diagnosis not present

## 2016-05-03 MED ORDER — GABAPENTIN 600 MG PO TABS: 1200.0000 mg | ORAL_TABLET | Freq: Three times a day (TID) | ORAL | 3 refills | Status: DC

## 2016-05-03 NOTE — Patient Instructions (Signed)
GENERAL RISKS AND COMPLICATIONS  What are the risk, side effects and possible complications? Generally speaking, most procedures are safe.  However, with any procedure there are risks, side effects, and the possibility of complications.  The risks and complications are dependent upon the sites that are lesioned, or the type of nerve block to be performed.  The closer the procedure is to the spine, the more serious the risks are.  Great care is taken when placing the radio frequency needles, block needles or lesioning probes, but sometimes complications can occur. 1. Infection: Any time there is an injection through the skin, there is a risk of infection.  This is why sterile conditions are used for these blocks.  There are four possible types of infection. 1. Localized skin infection. 2. Central Nervous System Infection-This can be in the form of Meningitis, which can be deadly. 3. Epidural Infections-This can be in the form of an epidural abscess, which can cause pressure inside of the spine, causing compression of the spinal cord with subsequent paralysis. This would require an emergency surgery to decompress, and there are no guarantees that the patient would recover from the paralysis. 4. Discitis-This is an infection of the intervertebral discs.  It occurs in about 1% of discography procedures.  It is difficult to treat and it may lead to surgery.        2. Pain: the needles have to go through skin and soft tissues, will cause soreness.       3. Damage to internal structures:  The nerves to be lesioned may be near blood vessels or    other nerves which can be potentially damaged.       4. Bleeding: Bleeding is more common if the patient is taking blood thinners such as  aspirin, Coumadin, Ticiid, Plavix, etc., or if he/she have some genetic predisposition  such as hemophilia. Bleeding into the spinal canal can cause compression of the spinal  cord with subsequent paralysis.  This would require an  emergency surgery to  decompress and there are no guarantees that the patient would recover from the  paralysis.       5. Pneumothorax:  Puncturing of a lung is a possibility, every time a needle is introduced in  the area of the chest or upper back.  Pneumothorax refers to free air around the  collapsed lung(s), inside of the thoracic cavity (chest cavity).  Another two possible  complications related to a similar event would include: Hemothorax and Chylothorax.   These are variations of the Pneumothorax, where instead of air around the collapsed  lung(s), you may have blood or chyle, respectively.       6. Spinal headaches: They may occur with any procedures in the area of the spine.       7. Persistent CSF (Cerebro-Spinal Fluid) leakage: This is a rare problem, but may occur  with prolonged intrathecal or epidural catheters either due to the formation of a fistulous  track or a dural tear.       8. Nerve damage: By working so close to the spinal cord, there is always a possibility of  nerve damage, which could be as serious as a permanent spinal cord injury with  paralysis.       9. Death:  Although rare, severe deadly allergic reactions known as "Anaphylactic  reaction" can occur to any of the medications used.      10. Worsening of the symptoms:  We can always make thing worse.    What are the chances of something like this happening? Chances of any of this occuring are extremely low.  By statistics, you have more of a chance of getting killed in a motor vehicle accident: while driving to the hospital than any of the above occurring .  Nevertheless, you should be aware that they are possibilities.  In general, it is similar to taking a shower.  Everybody knows that you can slip, hit your head and get killed.  Does that mean that you should not shower again?  Nevertheless always keep in mind that statistics do not mean anything if you happen to be on the wrong side of them.  Even if a procedure has a 1  (one) in a 1,000,000 (million) chance of going wrong, it you happen to be that one..Also, keep in mind that by statistics, you have more of a chance of having something go wrong when taking medications.  Who should not have this procedure? If you are on a blood thinning medication (e.g. Coumadin, Plavix, see list of "Blood Thinners"), or if you have an active infection going on, you should not have the procedure.  If you are taking any blood thinners, please inform your physician.  How should I prepare for this procedure?  Do not eat or drink anything at least six hours prior to the procedure.  Bring a driver with you .  It cannot be a taxi.  Come accompanied by an adult that can drive you back, and that is strong enough to help you if your legs get weak or numb from the local anesthetic.  Take all of your medicines the morning of the procedure with just enough water to swallow them.  If you have diabetes, make sure that you are scheduled to have your procedure done first thing in the morning, whenever possible.  If you have diabetes, take only half of your insulin dose and notify our nurse that you have done so as soon as you arrive at the clinic.  If you are diabetic, but only take blood sugar pills (oral hypoglycemic), then do not take them on the morning of your procedure.  You may take them after you have had the procedure.  Do not take aspirin or any aspirin-containing medications, at least eleven (11) days prior to the procedure.  They may prolong bleeding.  Wear loose fitting clothing that may be easy to take off and that you would not mind if it got stained with Betadine or blood.  Do not wear any jewelry or perfume  Remove any nail coloring.  It will interfere with some of our monitoring equipment.  NOTE: Remember that this is not meant to be interpreted as a complete list of all possible complications.  Unforeseen problems may occur.  BLOOD THINNERS The following drugs  contain aspirin or other products, which can cause increased bleeding during surgery and should not be taken for 2 weeks prior to and 1 week after surgery.  If you should need take something for relief of minor pain, you may take acetaminophen which is found in Tylenol,m Datril, Anacin-3 and Panadol. It is not blood thinner. The products listed below are.  Do not take any of the products listed below in addition to any listed on your instruction sheet.  A.P.C or A.P.C with Codeine Codeine Phosphate Capsules #3 Ibuprofen Ridaura  ABC compound Congesprin Imuran rimadil  Advil Cope Indocin Robaxisal  Alka-Seltzer Effervescent Pain Reliever and Antacid Coricidin or Coricidin-D  Indomethacin Rufen    Alka-Seltzer plus Cold Medicine Cosprin Ketoprofen S-A-C Tablets  Anacin Analgesic Tablets or Capsules Coumadin Korlgesic Salflex  Anacin Extra Strength Analgesic tablets or capsules CP-2 Tablets Lanoril Salicylate  Anaprox Cuprimine Capsules Levenox Salocol  Anexsia-D Dalteparin Magan Salsalate  Anodynos Darvon compound Magnesium Salicylate Sine-off  Ansaid Dasin Capsules Magsal Sodium Salicylate  Anturane Depen Capsules Marnal Soma  APF Arthritis pain formula Dewitt's Pills Measurin Stanback  Argesic Dia-Gesic Meclofenamic Sulfinpyrazone  Arthritis Bayer Timed Release Aspirin Diclofenac Meclomen Sulindac  Arthritis pain formula Anacin Dicumarol Medipren Supac  Analgesic (Safety coated) Arthralgen Diffunasal Mefanamic Suprofen  Arthritis Strength Bufferin Dihydrocodeine Mepro Compound Suprol  Arthropan liquid Dopirydamole Methcarbomol with Aspirin Synalgos  ASA tablets/Enseals Disalcid Micrainin Tagament  Ascriptin Doan's Midol Talwin  Ascriptin A/D Dolene Mobidin Tanderil  Ascriptin Extra Strength Dolobid Moblgesic Ticlid  Ascriptin with Codeine Doloprin or Doloprin with Codeine Momentum Tolectin  Asperbuf Duoprin Mono-gesic Trendar  Aspergum Duradyne Motrin or Motrin IB Triminicin  Aspirin  plain, buffered or enteric coated Durasal Myochrisine Trigesic  Aspirin Suppositories Easprin Nalfon Trillsate  Aspirin with Codeine Ecotrin Regular or Extra Strength Naprosyn Uracel  Atromid-S Efficin Naproxen Ursinus  Auranofin Capsules Elmiron Neocylate Vanquish  Axotal Emagrin Norgesic Verin  Azathioprine Empirin or Empirin with Codeine Normiflo Vitamin E  Azolid Emprazil Nuprin Voltaren  Bayer Aspirin plain, buffered or children's or timed BC Tablets or powders Encaprin Orgaran Warfarin Sodium  Buff-a-Comp Enoxaparin Orudis Zorpin  Buff-a-Comp with Codeine Equegesic Os-Cal-Gesic   Buffaprin Excedrin plain, buffered or Extra Strength Oxalid   Bufferin Arthritis Strength Feldene Oxphenbutazone   Bufferin plain or Extra Strength Feldene Capsules Oxycodone with Aspirin   Bufferin with Codeine Fenoprofen Fenoprofen Pabalate or Pabalate-SF   Buffets II Flogesic Panagesic   Buffinol plain or Extra Strength Florinal or Florinal with Codeine Panwarfarin   Buf-Tabs Flurbiprofen Penicillamine   Butalbital Compound Four-way cold tablets Penicillin   Butazolidin Fragmin Pepto-Bismol   Carbenicillin Geminisyn Percodan   Carna Arthritis Reliever Geopen Persantine   Carprofen Gold's salt Persistin   Chloramphenicol Goody's Phenylbutazone   Chloromycetin Haltrain Piroxlcam   Clmetidine heparin Plaquenil   Cllnoril Hyco-pap Ponstel   Clofibrate Hydroxy chloroquine Propoxyphen         Before stopping any of these medications, be sure to consult the physician who ordered them.  Some, such as Coumadin (Warfarin) are ordered to prevent or treat serious conditions such as "deep thrombosis", "pumonary embolisms", and other heart problems.  The amount of time that you may need off of the medication may also vary with the medication and the reason for which you were taking it.  If you are taking any of these medications, please make sure you notify your pain physician before you undergo any  procedures.         Pain Management Discharge Instructions  General Discharge Instructions :  If you need to reach your doctor call: Monday-Friday 8:00 am - 4:00 pm at [phone] or toll free [phone].  After clinic hours [phone] to have operator reach doctor.  Bring all of your medication bottles to all your appointments in the pain clinic.  To cancel or reschedule your appointment with Pain Management please remember to call 24 hours in advance to avoid a fee.  Refer to the educational materials which you have been given on: General Risks, I had my Procedure. Discharge Instructions, Post Sedation.  Post Procedure Instructions:  The drugs you were given will stay in your system until tomorrow, so for the next 24 hours you should   not drive, make any legal decisions or drink any alcoholic beverages.  You may eat anything you prefer, but it is better to start with liquids then soups and crackers, and gradually work up to solid foods.  Please notify your doctor immediately if you have any unusual bleeding, trouble breathing or pain that is not related to your normal pain.  Depending on the type of procedure that was done, some parts of your body may feel week and/or numb.  This usually clears up by tonight or the next day.  Walk with the use of an assistive device or accompanied by an adult for the 24 hours.  You may use ice on the affected area for the first 24 hours.  Put ice in a Ziploc bag and cover with a towel and place against area 15 minutes on 15 minutes off.  You may switch to heat after 24 hours.Epidural Steroid Injection Patient Information  Description: The epidural space surrounds the nerves as they exit the spinal cord.  In some patients, the nerves can be compressed and inflamed by a bulging disc or a tight spinal canal (spinal stenosis).  By injecting steroids into the epidural space, we can bring irritated nerves into direct contact with a potentially  helpful medication.  These steroids act directly on the irritated nerves and can reduce swelling and inflammation which often leads to decreased pain.  Epidural steroids may be injected anywhere along the spine and from the neck to the low back depending upon the location of your pain.   After numbing the skin with local anesthetic (like Novocaine), a small needle is passed into the epidural space slowly.  You may experience a sensation of pressure while this is being done.  The entire block usually last less than 10 minutes.  Conditions which may be treated by epidural steroids:   Low back and leg pain  Neck and arm pain  Spinal stenosis  Post-laminectomy syndrome  Herpes zoster (shingles) pain  Pain from compression fractures  Preparation for the injection:  1. Do not eat any solid food or dairy products within 8 hours of your appointment.  2. You may drink clear liquids up to 3 hours before appointment.  Clear liquids include water, black coffee, juice or soda.  No milk or cream please. 3. You may take your regular medication, including pain medications, with a sip of water before your appointment  Diabetics should hold regular insulin (if taken separately) and take 1/2 normal NPH dos the morning of the procedure.  Carry some sugar containing items with you to your appointment. 4. A driver must accompany you and be prepared to drive you home after your procedure.  5. Bring all your current medications with your. 6. An IV may be inserted and sedation may be given at the discretion of the physician.   7. A blood pressure cuff, EKG and other monitors will often be applied during the procedure.  Some patients may need to have extra oxygen administered for a short period. 8. You will be asked to provide medical information, including your allergies, prior to the procedure.  We must know immediately if you are taking blood thinners (like Coumadin/Warfarin)  Or if you are allergic to IV iodine  contrast (dye). We must know if you could possible be pregnant.  Possible side-effects:  Bleeding from needle site  Infection (rare, may require surgery)  Nerve injury (rare)  Numbness & tingling (temporary)  Difficulty urinating (rare, temporary)  Spinal headache (   a headache worse with upright posture)  Light -headedness (temporary)  Pain at injection site (several days)  Decreased blood pressure (temporary)  Weakness in arm/leg (temporary)  Pressure sensation in back/neck (temporary)  Call if you experience:  Fever/chills associated with headache or increased back/neck pain.  Headache worsened by an upright position.  New onset weakness or numbness of an extremity below the injection site  Hives or difficulty breathing (go to the emergency room)  Inflammation or drainage at the infection site  Severe back/neck pain  Any new symptoms which are concerning to you  Please note:  Although the local anesthetic injected can often make your back or neck feel good for several hours after the injection, the pain will likely return.  It takes 3-7 days for steroids to work in the epidural space.  You may not notice any pain relief for at least that one week.  If effective, we will often do a series of three injections spaced 3-6 weeks apart to maximally decrease your pain.  After the initial series, we generally will wait several months before considering a repeat injection of the same type.  If you have any questions, please call [phone] [facility]

## 2016-05-03 NOTE — Telephone Encounter (Signed)
Sent to pharmacy 

## 2016-05-03 NOTE — Progress Notes (Signed)
Subjective:  Patient ID: James Hood, male    DOB: 05-29-1940  Age: 75 y.o. MRN: [id number]  CC: No chief complaint on file.      PROCEDURE:None  HPI James Hood presents for a new patient evaluation. He is a pleasant 75 year old white male with a long-standing history of low back pain for over one year. He reports that he had a laminectomy in October of this year with Dr. Joya Salm. He also states that he has continued to have lancinating left posterior and lateral leg pain described as sharp nagging pulsating and throbbing it's present throughout much of the day. He rates this as a VAS of 7 best a 2 and now a 5. He feels that it is gotten worse. He has some chronic intermittent weakness of the left leg. He had an MRI that was reviewed with him today that is in follow-up to his October surgery. He also reports having had 3 epidural steroids prior to his surgery and 1 L5-S1 epidural after this. The pain is worse with bending climbing motion squatting and better with rest sleep. 5 mg of oxycodone does not give him much relief. We have reviewed his physician database and there are no concerns. He also takes Neurontin 1200 mg 3 times a day.  History James Hood has a past medical history of Anxiety; Anxiety and depression; Arthritis; Colon polyps; Dementia; Depression; Diabetes mellitus type 2 in nonobese [facility]); GERD (gastroesophageal reflux disease); Headache; 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[place]); History of hiatal hernia; Hypercholesteremia; Hypertension; Hyperthyroidism; Neuropathic pain; Primary localized osteoarthritis of right knee; Stomach ulcer; and Tremor, essential.   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   His family history includes Depression in his father; Heart attack in his mother; Heart disease in his mother;  Hypertension in his father.He reports that he has been smoking Cigarettes.  He has a 60.00 pack-year smoking history. He does not have any smokeless tobacco history on file. He reports that he does not drink alcohol or use drugs.  No results found for this or any previous visit.  No results found for: TOXASSSELUR  Outpatient Medications Prior to Visit  Medication Sig Dispense Refill  . acetaminophen (TYLENOL) 500 MG tablet Take 1,000 mg by mouth as needed.     Marland Kitchen aspirin EC 81 MG tablet Take 81 mg by mouth every morning.     Marland Kitchen buPROPion (ZYBAN) 150 MG 12 hr tablet Take 150 mg by mouth 2 (two) times daily.    . clorazepate (TRANXENE) 3.75 MG tablet Take 1 tablet (3.75 mg total) by mouth at bedtime as needed for anxiety. 30 tablet 0  . colestipol (COLESTID) 1 g tablet Take 2 g by mouth daily.     . cyclobenzaprine (FLEXERIL) 10 MG tablet Take 10 mg by mouth 3 (three) times daily as needed for muscle spasms.    . diphenoxylate-atropine (LOMOTIL) 2.5-0.025 MG tablet Take 1-2 tablets by mouth 2 (two) times daily. Take 2 tablets in the morning, and 1 tablet in the evening    . divalproex (DEPAKOTE ER) 250 MG 24 hr tablet Take 250 mg by mouth 2 (two) times daily. Pt takes 250mg  in the morning, and 250mg  in the afternoon    . donepezil (ARICEPT) 10 MG tablet Take 10 mg by mouth at bedtime.    . fenofibrate (TRICOR) 145 MG tablet Take 1 tablet (145 mg total) by mouth daily.  90 tablet 1  . finasteride (PROSCAR) 5 MG tablet Take 1 tablet (5 mg total) by mouth daily. 30 tablet 11  . gabapentin (NEURONTIN) 600 MG tablet Take 2 tablets (1,200 mg total) by mouth 3 (three) times daily. 180 tablet 3  . glimepiride (AMARYL) 4 MG tablet TAKE ONE TABLET BY MOUTH TWO TIMES A DAY 180 tablet 0  . lisinopril-hydrochlorothiazide (PRINZIDE,ZESTORETIC) 20-25 MG tablet TAKE 1 TABLET BY MOUTH 2 (TWO) TIMES DAILY. 60 tablet 0  . metFORMIN (GLUCOPHAGE) 500 MG tablet Take 2 tablets (1,000 mg total) by mouth 2 (two) times daily  with a meal. 180 tablet 3  . metoprolol tartrate (LOPRESSOR) 25 MG tablet Take 25 mg by mouth daily.     Marland Kitchen omeprazole (PRILOSEC OTC) 20 MG tablet Take 20 mg by mouth 2 (two) times daily.    . propranolol (INDERAL) 60 MG tablet Take 60 mg by mouth 2 (two) times daily.     . QUEtiapine (SEROQUEL) 25 MG tablet Take 75 mg by mouth at bedtime.     . simvastatin (ZOCOR) 40 MG tablet Take 1 tablet (40 mg total) by mouth daily. 90 tablet 1  . tamsulosin (FLOMAX) 0.4 MG CAPS capsule Take 0.4 mg by mouth daily after supper.     Marland Kitchen HYDROcodone-acetaminophen (NORCO/VICODIN) 5-325 MG tablet Take 1 tablet by mouth every 6 (six) hours as needed for moderate pain. (Patient not taking: Reported on 05/03/2016) 10 tablet 0  . memantine (NAMENDA) 5 MG tablet Take 5 mg by mouth 2 (two) times daily.    . primidone (MYSOLINE) 50 MG tablet Take 150-200 mg by mouth See admin instructions. Pt takes 200mg  in the morning, and 150mg  in the evening    . traMADol (ULTRAM) 50 MG tablet Take 1 tablet (50 mg total) by mouth every 6 (six) hours as needed. (Patient not taking: Reported on 05/03/2016) 30 tablet 0   No facility-administered medications prior to visit.    Lab Results  Component Value Date   WBC 6.5 01/25/2016   HGB 11.7 (L) 01/25/2016   HCT 36.1 (L) 01/25/2016   PLT 267 01/25/2016   GLUCOSE 151 (H) 03/13/2016   CHOL 161 09/16/2015   TRIG 393.0 (H) 09/16/2015   HDL 38.50 (L) 09/16/2015   LDLDIRECT 66.0 09/16/2015   ALT 20 03/13/2016   AST 21 03/13/2016   NA 137 03/13/2016   K 4.4 03/13/2016   CL 100 03/13/2016   CREATININE 1.27 03/13/2016   BUN 30 (H) 03/13/2016   CO2 27 03/13/2016   PSA 0.07 (L) 09/16/2015   INR 1.08 11/05/2014   HGBA1C 6.2 (H) 01/25/2016    --------------------------------------------------------------------------------------------------------------------- Mr Lumbar Spine Wo Contrast  Result Date: 03/29/2016 CLINICAL DATA:  Initial evaluation for chronic low back pain with  left-sided sciatica, worsened. EXAM: MRI LUMBAR SPINE WITHOUT CONTRAST TECHNIQUE: Multiplanar, multisequence MR imaging of the lumbar spine was performed. No intravenous contrast was administered. COMPARISON:  Prior MRI from 09/22/2015. FINDINGS: Segmentation: L normal segmentation. Lowest well-formed disc is labeled the L5-S1 level. Same numbering system is employed as on previous exams. Alignment: Straightening with slight reversal of the normal lumbar lordosis, stable. No listhesis. Vertebrae: Vertebral body heights are maintained. No evidence for acute or chronic fracture. Reactive endplate changes about the left aspect of the L5-S1 interspace, similar to previous. Postoperative changes from recent decompressive left hemi laminectomy with micro discectomy seen at L5-S1. No concerning features identified. Conus medullaris: Extends to the L1 level and appears normal. Paraspinal and other soft tissues:  Normal expected postoperative no concerning features identified. Paraspinous soft tissues otherwise within normal limits. Visualized visceral structures are normal. No retroperitoneal adenopathy. Changes related to decompressive laminectomy present within the posterior paraspinous soft tissues of the lower back. Disc levels: L1-2: Degenerative disc desiccation with intervertebral disc space narrowing and mild disc bulge. No stenosis. L2-3: Degenerative disc desiccation with intervertebral disc space narrowing and mild disc bulge. No stenosis. L3-4: Degenerative disc desiccation with mild diffuse disc bulge. Mild facet hypertrophy. Mild right foraminal stenosis related to disc bulge and facet disease, stable. No canal or left foraminal narrowing. L4-5: Degenerative disc bulge with intervertebral disc space narrowing and disc desiccation. Disc bulging a centric to the right without focal disc protrusion. Superimposed bilateral facet arthrosis with ligamentum flavum hypertrophy. Reactive effusions within the bilateral  L4-5 facets, stable. Minimal bilateral subarticular stenosis, slightly greater on the left. Mild bilateral foraminal narrowing related to disc bulge and facet disease, stable. L5-S1: Postoperative changes from interval decompressive left hemi laminectomy with micro discectomy. Previously seen left subarticular disc protrusion has been largely resected, although there appears to be a small residual central disc protrusion that minimally indents the ventral thecal sac (series 6, image 28). Soft tissue density within the left lateral epidural space likely reflects postoperative granulation tissue, although evaluation somewhat limited due to lack of IV contrast. This surrounds the transiting S1 nerve root in the left lateral recess (series 7, image 28). No significant canal stenosis. Superimposed bilateral facet arthrosis with reactive effusions in the bilateral L5-S1 facets is similar to previous. Mild left foraminal narrowing is relatively unchanged. No significant right foraminal stenosis. IMPRESSION: 1. Postoperative changes from recent decompressive left hemi laminectomy with microdiskectomy at L5-S1. Postoperative granulation tissue within the left epidural space, surrounding the descending left S1 nerve root. No residual stenosis or concerning features identified. 2. 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 No other significant stenosis or evidence for impingement. Electronically Signed   By: Jeannine Boga M.D.   On: 03/29/2016 13:10       ---------------------------------------------------------------------------------------------------------------------- Past Medical History:  Diagnosis Date  . Anxiety   . Anxiety and depression   . Arthritis   . Colon polyps   . Dementia   . Depression   . Diabetes mellitus type 2 in nonobese (HCC)   . GERD (gastroesophageal reflux disease)   . Headache   . 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[place])   .  History of hiatal hernia   . Hypercholesteremia   . Hypertension   . Hyperthyroidism   . Neuropathic pain   . Primary localized osteoarthritis of right knee   . Stomach ulcer   . Tremor, essential     Past Surgical History:  Procedure Laterality Date  . esophageal stretch    . JOINT REPLACEMENT    . LUMBAR LAMINECTOMY/DECOMPRESSION MICRODISCECTOMY Left 02/03/2016   Procedure: LEFT L5-S1 DISKECTOMY;  Surgeon: Leeroy Cha, MD;  Location: [place] [facility];  Service: Neurosurgery;  Laterality: Left;  LEFT L5-S1 DISKECTOMY  . TONSILLECTOMY    . TOTAL KNEE ARTHROPLASTY Right 11/15/2014   Procedure: TOTAL KNEE ARTHROPLASTY;  Surgeon: Elsie Saas, MD;  Location: [place];  Service: Orthopedics;  Laterality: Right;    Family History  Problem Relation Age of Onset  . Heart attack Mother   . Heart disease Mother   . Hypertension Father   . Depression Father   . Kidney cancer Neg Hx   . Prostate cancer Neg Hx  Social History  Substance Use Topics  . Smoking status: Current Every Day Smoker    Packs/day: 1.00    Years: 60.00    Types: Cigarettes  . Smokeless tobacco: Not on file  . 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 Marital status: Married    Spouse name: N/A  . Number of children: N/A  . Years of education: N/A   Social History Main Topics  . Smoking status: Current Every Day Smoker    Packs/day: 1.00    Years: 60.00    Types: Cigarettes  . Smokeless tobacco: None  . 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 Drug use: No  . Sexual activity: Not Currently   Other Topics Concern  . None   Social History Narrative  . None    Scheduled Meds: Continuous Infusions: PRN Meds:.   BP (!) 144/84 (BP Location: Right Arm, Patient Position: Sitting, Cuff Size: Normal)    Pulse (!) 101   Temp 98 F (36.7 C) (Oral)   Resp 16   Ht 5\' 11"  (1.803 m)   Wt 195 lb (88.5 kg)   SpO2 95%   BMI 27.20 kg/m    BP Readings from Last 3 Encounters:  05/03/16 (!) 144/84  03/28/16 128/86  03/13/16 120/70     Wt Readings from Last 3 Encounters:  05/03/16 195 lb (88.5 kg)  03/28/16 200 lb (90.7 kg)  03/13/16 195 lb (88.5 kg)     ----------------------------------------------------------------------------------------------------------------------  ROS Review of Systems  Cardiac: No angina or shortness of breath Lungs: No wheezing Musculoskeletal: As above with persistent left posterior and lateral leg pain  Objective:  BP (!) 144/84 (BP Location: Right Arm, Patient Position: Sitting, Cuff Size: Normal)   Pulse (!) 101   Temp 98 F (36.7 C) (Oral)   Resp 16   Ht 5\' 11"  (1.803 m)   Wt 195 lb (88.5 kg)   SpO2 95%   BMI 27.20 kg/m   Physical Exam Patient is alert oriented cooperative compliant Lungs are clear to auscultation Heart is regular rate and rhythm Evaluation of the low back reveals a well-healed midline scar with no significant paraspinous muscle tenderness. He has no significant pain on extension while in the standing position. While supine he has a positive straight leg raise on the left side at 45 which does reproduce his primary pain complaint. His is negative on the right. His muscle tone and bulk is good and his strength appears to be intact.     Assessment & Plan:   Diagnoses and all orders for this visit:  Sciatica of left side associated with disorder of lumbar spine -     Lumbar Epidural Injection; Future -     ToxASSURE Select 13 (MW), Urine  Chronic left-sided low back pain with left-sided sciatica -     Lumbar Epidural Injection; Future -     ToxASSURE Select 13 (MW), Urine     ----------------------------------------------------------------------------------------------------------------------  Problem List  Items Addressed This Visit      Other   Chronic lumbar pain   Relevant Medications   Oxycodone HCl 10 MG TABS   oxyCODONE (OXY IR/ROXICODONE) 5 MG immediate release tablet   oxyCODONE-acetaminophen (PERCOCET/ROXICET) 5-325 MG tablet   predniSONE (STERAPRED UNI-PAK 48 TAB) 10 MG (48) TBPK tablet   Other Relevant Orders   Lumbar Epidural Injection  ToxASSURE Select 13 (MW), Urine    Other Visit Diagnoses    Sciatica of left side associated with disorder of lumbar spine    -  Primary   Relevant Medications   divalproex (DEPAKOTE ER) 500 MG 24 hr tablet   gabapentin (NEURONTIN) 300 MG capsule   memantine (NAMENDA) 10 MG tablet   gabapentin (NEURONTIN) 300 MG capsule   memantine (NAMENDA) 10 MG tablet   primidone (MYSOLINE) 50 MG tablet   Other Relevant Orders   Lumbar Epidural Injection   ToxASSURE Select 13 (MW), Urine      ----------------------------------------------------------------------------------------------------------------------  1. Sciatica of left side associated with disorder of lumbar spine We will plan on a caudal epidural steroid at his next visit. He has had a previous L5-S1 injection without much improvement but I think this is the only thing in reserve that would be possibly beneficial for him. He had gone over the risks and benefits of that procedure. - Lumbar Epidural Injection; Future - ToxASSURE Select 13 (MW), Urine  2. Chronic left-sided low back pain with left-sided sciatica I think it is reasonable to stay on his existing medications. I think is also reasonable to be on a low-dose opioid however he has a complicated past medical history with a history of alcohol abuse in addition to depression. - Lumbar Epidural Injection; Future - ToxASSURE Select 13 (MW), Urine    ----------------------------------------------------------------------------------------------------------------------  I am having Mr. Lubeck maintain his buPROPion, primidone,  divalproex, metoprolol tartrate, cyclobenzaprine, omeprazole, memantine, donepezil, QUEtiapine, colestipol, aspirin EC, diphenoxylate-atropine, tamsulosin, propranolol, finasteride, clorazepate, acetaminophen, metFORMIN, fenofibrate, HYDROcodone-acetaminophen, glimepiride, lisinopril-hydrochlorothiazide, simvastatin, traMADol, gabapentin, divalproex, gabapentin, memantine, metoprolol succinate, omeprazole, Oxycodone HCl, oxyCODONE, oxyCODONE-acetaminophen, predniSONE, gabapentin, memantine, and primidone.   Meds ordered this encounter  Medications  . divalproex (DEPAKOTE ER) 500 MG 24 hr tablet  . gabapentin (NEURONTIN) 300 MG capsule  . memantine (NAMENDA) 10 MG tablet  . metoprolol succinate (TOPROL-XL) 25 MG 24 hr tablet  . omeprazole (PRILOSEC) 20 MG capsule  . Oxycodone HCl 10 MG TABS  . oxyCODONE (OXY IR/ROXICODONE) 5 MG immediate release tablet  . oxyCODONE-acetaminophen (PERCOCET/ROXICET) 5-325 MG tablet    Sig: Take by mouth.  . predniSONE (STERAPRED UNI-PAK 48 TAB) 10 MG (48) TBPK tablet  . gabapentin (NEURONTIN) 300 MG capsule    Sig: TAKE 1 CAPSULE (300 MG TOTAL) BY MOUTH 3 (THREE) TIMES DAILY. MAY INCREASE TO 2 CAPS THREE TIMES DAILY IF NEEDED  . memantine (NAMENDA) 10 MG tablet    Sig: Take by mouth.  . primidone (MYSOLINE) 50 MG tablet    Sig: TAKE 5 TABLETS IN THE MORNING AND 3 TABLETS IN THE EVENING       Follow-up: Return in about 3 weeks (around 05/24/2016) for evaluation, procedure caudal epidural.    Molli Barrows, MD @DATE @  The  practitioner database for opioid medications on this patient has been reviewed by me and my staff   Greater than 50% of the total encounter time was spent in counseling and / or coordination of care.     This dictation was performed utilizing Systems analyst.  Please excuse any unintentional or mistaken typographical errors as a result.

## 2016-05-03 NOTE — Telephone Encounter (Signed)
Please advise on refill. Historical provider.  

## 2016-05-03 NOTE — Progress Notes (Signed)
Safety precautions to be maintained throughout the outpatient stay will include: orient to surroundings, keep bed in low position, maintain call bell within reach at all times, provide assistance with transfer out of bed and ambulation.  

## 2016-05-03 NOTE — Telephone Encounter (Signed)
Patient requested a medication refill for gabapentin [facility]

## 2016-05-09 ENCOUNTER — Telehealth: Payer: Self-pay | Admitting: Anesthesiology

## 2016-05-09 NOTE — Telephone Encounter (Signed)
Left voicemail informing patient that there are no available appointments through January. He should call Dr. Caryl Bis to get prescription for pain until he can be seen by Dr. Andree Elk.

## 2016-05-09 NOTE — Telephone Encounter (Signed)
Dr. Andree Elk out of town. No appointments available through January per Juliann Pulse. Will ask patient to ask PCP for pain meds.

## 2016-05-09 NOTE — Telephone Encounter (Signed)
Patient called in today to follow up on procedure authorization, I explained to him that [facility] has a new prior auth protocol that I should receive information on today and that Dr Andree Elk next available procedure date is 06/12/2016. Patient was very upset and states that he needs something for pain until then and that he will come in if necessary. I will attempt to get the authorization asap for a possible work in appointment if Dr Andree Elk agrees but this will not be possible until after 05/21/2016 due to insurance changes.  Please call patient   Thank you

## 2016-05-10 ENCOUNTER — Other Ambulatory Visit: Payer: Self-pay | Admitting: Family Medicine

## 2016-05-10 NOTE — Telephone Encounter (Signed)
Please advise and see message from pain management in chart

## 2016-05-10 NOTE — Telephone Encounter (Signed)
Lisinopril-hydrochlorothiazide last filled by Te[facility],Np 04/02/16 60 0rf

## 2016-05-10 NOTE — Telephone Encounter (Signed)
Patient has requested a medication refill lisinopril  [facility]

## 2016-05-10 NOTE — Telephone Encounter (Signed)
Pt called and stated that he had surgery on his L5 on 02/2016, he was then referred to pain management. He went in for a consultation for pain management and but they are unable to get him in until the end of January. Pt would like to know if you would fill oxyCODONE (OXY IR/ROXICODONE) 5 MG immediate release tablet to get him through till the end of January. Please advise, thank you!  Call pt @ [phone]

## 2016-05-11 LAB — TOXASSURE SELECT 13 (MW), URINE

## 2016-05-11 MED ORDER — LISINOPRIL-HYDROCHLOROTHIAZIDE 20-25 MG PO TABS: 1.0000 | ORAL_TABLET | Freq: Two times a day (BID) | ORAL | 2 refills | Status: DC

## 2016-05-11 NOTE — Telephone Encounter (Signed)
Please confirm with the patient that he takes the lisinopril-HCTZ twice daily. Unfortunately I will be unable to fill the oxycodone at this time without an office visit. I would suggest he contact his surgeon if he has not already to see if they are willing to fill this until he sees the pain management specialist.

## 2016-05-11 NOTE — Telephone Encounter (Signed)
Correction:Patient is scheduled with you on tue at 03:30

## 2016-05-11 NOTE — Telephone Encounter (Signed)
Sent to pharmacy 

## 2016-05-11 NOTE — Telephone Encounter (Signed)
Patient is scheduled on tue 3:30 for pain med consult just to get him to his appointment, patients wife states he has tried the surgeon and pain management they are unable to help. Referred patient to pcp. Patient does take lisinopril bid

## 2016-05-15 ENCOUNTER — Ambulatory Visit (INDEPENDENT_AMBULATORY_CARE_PROVIDER_SITE_OTHER): Payer: PPO | Admitting: Family Medicine

## 2016-05-15 ENCOUNTER — Encounter: Payer: Self-pay | Admitting: Family Medicine

## 2016-05-15 DIAGNOSIS — M5126 Other intervertebral disc displacement, lumbar region: Secondary | ICD-10-CM | POA: Diagnosis not present

## 2016-05-15 MED ORDER — OXYCODONE HCL 5 MG PO TABS: 5.0000 mg | ORAL_TABLET | Freq: Four times a day (QID) | ORAL | 0 refills | Status: DC | PRN

## 2016-05-15 NOTE — Assessment & Plan Note (Addendum)
Continues to have issues with sciatica-like pain. Has gotten some relief with oxycodone. Patient reports no drug or alcohol abuse. Takes it 2-3 times a day. Discussed that we would only be filling this until he is able to get in to see his pain management physician. There is no appointment scheduled at this time thus we'll have our office contact them to see if they can set up an appointment. Refill of oxycodone given. He can also take ibuprofen as outlined in the AVS. He is given return precautions.

## 2016-05-15 NOTE — Progress Notes (Signed)
Pre visit review using our clinic review tool, if applicable. No additional management support is needed unless otherwise documented below in the visit note. 

## 2016-05-15 NOTE — Progress Notes (Signed)
  Tommi Rumps, MD Phone: [phone]  James Hood is a 75 y.o. male who presents today for follow-up.  Sciatica: Patient continues to have issues with left-sided sciatica. Notes it is constant lancinating pain down the posterior aspect of his left leg. He notes no back pain with this. He had a surgery several months ago that did not help with his pain. Tramadol and Vicodin have not been beneficial. He has been taking oxycodone prescribed by his neurosurgeon, 2-3 tablets a day. Also taking ibuprofen with this that is somewhat beneficial. He has been evaluated by pain management though is awaiting a follow-up appointment. He reports no numbness, weakness, loss of bowel or bladder function, saddle anesthesia, or fevers.  ROS see history of present illness  Objective  Physical Exam Vitals:   05/15/16 1516  BP: 132/84  Pulse: 90  Temp: 98.3 F (36.8 C)    BP Readings from Last 3 Encounters:  05/15/16 132/84  05/03/16 (!) 144/84  03/28/16 128/86   Wt Readings from Last 3 Encounters:  05/15/16 199 lb 9.6 oz (90.5 kg)  05/03/16 195 lb (88.5 kg)  03/28/16 200 lb (90.7 kg)    Physical Exam  Constitutional: No distress.  Cardiovascular: Normal rate, regular rhythm and normal heart sounds.   Pulmonary/Chest: Effort normal and breath sounds normal.  Musculoskeletal: He exhibits no edema.  No midline spine tenderness, no midline spine step-off, no muscular back tenderness, well-healed midline lumbar spine scar  Neurological: He is alert. Gait normal.  5 out of 5 strength bilateral quads, hamstrings, plantar flexion, and dorsiflexion, sensation light touch intact bilateral lower extremities  Skin: Skin is warm and dry. He is not diaphoretic.     Assessment/Plan: Please see individual problem list.  Lumbar herniated disc Continues to have issues with sciatica-like pain. Has gotten some relief with oxycodone. Patient reports no drug or alcohol abuse. Takes it 2-3 times a day.  Discussed that we would only be filling this until he is able to get in to see his pain management physician. There is no appointment scheduled at this time thus we'll have our office contact them to see if they can set up an appointment. Refill of oxycodone given. He can also take ibuprofen as outlined in the AVS. He is given return precautions.   No orders of the defined types were placed in this encounter.   Meds ordered this encounter  Medications  . oxyCODONE (OXY IR/ROXICODONE) 5 MG immediate release tablet    Sig: Take 1 tablet (5 mg total) by mouth every 6 (six) hours as needed for severe pain.    Dispense:  45 tablet    Refill:  0    Tommi Rumps, MD [place]

## 2016-05-15 NOTE — Patient Instructions (Signed)
Nice to see you. We're going to fill a short-term supply of oxycodone until you can get in to see pain management. You can additionally take ibuprofen 600 mg every 8 hours as needed for pain. You need to take this with food. If you develop worsening pain, or develop numbness, weakness, loss of bowel or bladder function, numbness between her legs, fevers, or any new or changing symptoms please seek medical attention immediately.

## 2016-05-16 ENCOUNTER — Other Ambulatory Visit: Payer: Self-pay | Admitting: Family Medicine

## 2016-05-16 MED ORDER — GABAPENTIN 600 MG PO TABS: 1200.0000 mg | ORAL_TABLET | Freq: Three times a day (TID) | ORAL | 3 refills | Status: DC

## 2016-05-29 ENCOUNTER — Ambulatory Visit (INDEPENDENT_AMBULATORY_CARE_PROVIDER_SITE_OTHER): Payer: PPO

## 2016-05-29 ENCOUNTER — Telehealth: Payer: Self-pay | Admitting: Anesthesiology

## 2016-05-29 ENCOUNTER — Encounter: Payer: Self-pay | Admitting: Family Medicine

## 2016-05-29 ENCOUNTER — Ambulatory Visit (INDEPENDENT_AMBULATORY_CARE_PROVIDER_SITE_OTHER): Payer: PPO | Admitting: Family Medicine

## 2016-05-29 VITALS — BP 128/72 | HR 99 | Temp 97.4°F | Wt 193.6 lb

## 2016-05-29 DIAGNOSIS — Z716 Tobacco abuse counseling: Secondary | ICD-10-CM | POA: Diagnosis not present

## 2016-05-29 DIAGNOSIS — R05 Cough: Secondary | ICD-10-CM

## 2016-05-29 DIAGNOSIS — R197 Diarrhea, unspecified: Secondary | ICD-10-CM | POA: Diagnosis not present

## 2016-05-29 DIAGNOSIS — R059 Cough, unspecified: Secondary | ICD-10-CM

## 2016-05-29 DIAGNOSIS — J0100 Acute maxillary sinusitis, unspecified: Secondary | ICD-10-CM | POA: Diagnosis not present

## 2016-05-29 LAB — BASIC METABOLIC PANEL
BUN: 31 mg/dL — ABNORMAL HIGH (ref 6–23)
CO2: 30 mEq/L (ref 19–32)
Calcium: 10 mg/dL (ref 8.4–10.5)
Chloride: 96 mEq/L (ref 96–112)
Creatinine, Ser: 1.33 mg/dL (ref 0.40–1.50)
GFR: 55.51 mL/min — ABNORMAL LOW (ref >60.00)
Glucose, Bld: 187 mg/dL — ABNORMAL HIGH (ref 70–99)
Potassium: 4.3 mEq/L (ref 3.5–5.1)
Sodium: 137 mEq/L (ref 135–145)

## 2016-05-29 MED ORDER — DOXYCYCLINE HYCLATE 100 MG PO TABS: 100.0000 mg | ORAL_TABLET | Freq: Two times a day (BID) | ORAL | 0 refills | Status: DC

## 2016-05-29 MED ORDER — NICOTINE 21 MG/24HR TD PT24: 21.0000 mg | MEDICATED_PATCH | Freq: Every day | TRANSDERMAL | 0 refills | Status: DC

## 2016-05-29 MED ORDER — OXYCODONE HCL 5 MG PO TABS: 5.0000 mg | ORAL_TABLET | Freq: Four times a day (QID) | ORAL | 0 refills | Status: DC | PRN

## 2016-05-29 NOTE — Progress Notes (Signed)
Pre visit review using our clinic review tool, if applicable. No additional management support is needed unless otherwise documented below in the visit note. 

## 2016-05-29 NOTE — Progress Notes (Signed)
Tommi Rumps, MD Phone: [phone]  James Hood is a 76 y.o. male who presents today for same-day visit.  Patient notes the last 2 weeks he has had sinus congestion and chest congestion. Coughing up yellow mucus. Blowing yellow mucus out of his nose. Has had sinus pressure. Has gotten progressively worse. No chest pain or shortness of breath. No fevers. Continues to smoke half a pack to three quarters of a pack a day. He has tried to stay hydrated with this. Additionally had some diarrhea starting yesterday with frequent loose bowel movements. Has been taking Imodium with this. No nausea or vomiting. No abdominal pain. No new foods or medicines. Has had some bowel urgency. No recent antibiotics.  PMH: Smoker   ROS see history of present illness  Objective  Physical Exam Vitals:   05/29/16 0833 05/30/16 1555  BP: 128/72   Pulse: (!) 105 99  Temp: 97.4 F (36.3 C)     BP Readings from Last 3 Encounters:  05/29/16 128/72  05/15/16 132/84  05/03/16 (!) 144/84   Wt Readings from Last 3 Encounters:  05/29/16 193 lb 9.6 oz (87.8 kg)  05/15/16 199 lb 9.6 oz (90.5 kg)  05/03/16 195 lb (88.5 kg)    Physical Exam  Constitutional: No distress.  HENT:  Head: Normocephalic and atraumatic.  Mouth/Throat: Oropharynx is clear and moist. No oropharyngeal exudate.  Eyes: Conjunctivae are normal. Pupils are equal, round, and reactive to light.  Cardiovascular: Normal rate, regular rhythm and normal heart sounds.   Pulmonary/Chest: Effort normal. No respiratory distress. He has no wheezes. He has no rales.  Mild right-sided crackles  Abdominal: Soft. Bowel sounds are normal. He exhibits no distension. There is no tenderness. There is no rebound and no guarding.  Neurological: He is alert. Gait normal.  Skin: He is not diaphoretic.     Assessment/Plan: Please see individual problem list.  Sinusitis, acute maxillary Patient's symptoms likely related to sinusitis though does  have some mild crackles in the right long. We will obtain a chest x-ray to rule out pneumonia. We will place on doxycycline to provide coverage for sinusitis and lung pathology. Patient is on oxycodone which will be beneficial for his cough. Given his diarrhea which is likely viral in nature we will check a BMP to ensure that he is well-hydrated. Encouraged hydration. He had a benign abdominal exam today. Discussed continuing to monitor his symptoms on antibiotics and if they worsen he should be reevaluated. He is given return precautions. I did discuss taking a probiotic or eating yogurt with the antibiotic given he already has diarrhea. Additionally also advised him not to take Imodium for his diarrhea and discussed the risks of taking Imodium.  Tobacco abuse counseling Patient continues to smoke. He is ready to quit smoking. Advised that Chantix is not a good option for him given his psychiatric issues. He is on Wellbutrin. Discussed trialing nicotine patches. These will be sent to his pharmacy.   Orders Placed This Encounter  Procedures  . DG Chest 2 View    Standing Status:   Future    Number of Occurrences:   1    Standing Expiration Date:   07/27/2017    Order Specific Question:   Reason for Exam (SYMPTOM  OR DIAGNOSIS REQUIRED)    Answer:   cough, right mid and lower lobe crackles    Order Specific Question:   Preferred imaging location?    Answer:   [facility]  . Basic metabolic  panel    Meds ordered this encounter  Medications  . oxyCODONE (OXY IR/ROXICODONE) 5 MG immediate release tablet    Sig: Take 1 tablet (5 mg total) by mouth every 6 (six) hours as needed for severe pain.    Dispense:  45 tablet    Refill:  0  . doxycycline (VIBRA-TABS) 100 MG tablet    Sig: Take 1 tablet (100 mg total) by mouth 2 (two) times daily.    Dispense:  14 tablet    Refill:  0  . nicotine (NICODERM CQ - DOSED IN MG/24 HOURS) 21 mg/24hr patch    Sig: Place 1 patch (21 mg total)  onto the skin daily.    Dispense:  42 patch    Refill:  0    Tommi Rumps, MD [place]

## 2016-05-29 NOTE — Patient Instructions (Signed)
Nice to see you. You likely have a sinus infection and possible bronchitis or pneumonia given your lung exam. We will treat you with doxycycline for this. Your diarrhea is possibly related to a viral issue. Please stop the Imodium. Please stay well hydrated. Please start on a probiotic and eating yogurt. We will check your kidney function today to ensure that you're well hydrated. If you develop abdominal pain, blood in your stool, cough productive of blood, shortness of breath, or any new or changing symptoms please seek medical attention immediately.

## 2016-05-29 NOTE — Telephone Encounter (Signed)
I received a staff message from Micheline Maze that Mr. Borromeo had called their office concerning the procedure ordered by Dr Andree Elk. I have submitted the request for prior authorization to the new [employer] Department and am awaiting the response. I will then talk with Dr Andree Elk about working Mr. Maggio into his schedule. I attempted to contact him this morning with no answer and no voicemail. I will attempt again later today.

## 2016-05-30 DIAGNOSIS — Z716 Tobacco abuse counseling: Secondary | ICD-10-CM | POA: Insufficient documentation

## 2016-05-30 DIAGNOSIS — J0100 Acute maxillary sinusitis, unspecified: Secondary | ICD-10-CM | POA: Insufficient documentation

## 2016-05-30 NOTE — Telephone Encounter (Signed)
Spoke with James Hood - I will contact him as soon as authorization is received and after I have spoke with Dr Andree Elk for a work in [employer]

## 2016-05-30 NOTE — Assessment & Plan Note (Signed)
Patient continues to smoke. He is ready to quit smoking. Advised that Chantix is not a good option for him given his psychiatric issues. He is on Wellbutrin. Discussed trialing nicotine patches. These will be sent to his pharmacy.

## 2016-05-30 NOTE — Assessment & Plan Note (Signed)
Patient's symptoms likely related to sinusitis though does have some mild crackles in the right long. We will obtain a chest x-ray to rule out pneumonia. We will place on doxycycline to provide coverage for sinusitis and lung pathology. Patient is on oxycodone which will be beneficial for his cough. Given his diarrhea which is likely viral in nature we will check a BMP to ensure that he is well-hydrated. Encouraged hydration. He had a benign abdominal exam today. Discussed continuing to monitor his symptoms on antibiotics and if they worsen he should be reevaluated. He is given return precautions. I did discuss taking a probiotic or eating yogurt with the antibiotic given he already has diarrhea. Additionally also advised him not to take Imodium for his diarrhea and discussed the risks of taking Imodium.

## 2016-06-13 ENCOUNTER — Ambulatory Visit (INDEPENDENT_AMBULATORY_CARE_PROVIDER_SITE_OTHER): Payer: PPO | Admitting: Family Medicine

## 2016-06-13 ENCOUNTER — Encounter: Payer: Self-pay | Admitting: Family Medicine

## 2016-06-13 VITALS — BP 110/68 | HR 77 | Temp 98.1°F | Wt 192.0 lb

## 2016-06-13 DIAGNOSIS — E119 Type 2 diabetes mellitus without complications: Secondary | ICD-10-CM

## 2016-06-13 DIAGNOSIS — K22719 Barrett's esophagus with dysplasia, unspecified: Secondary | ICD-10-CM

## 2016-06-13 DIAGNOSIS — M5126 Other intervertebral disc displacement, lumbar region: Secondary | ICD-10-CM | POA: Diagnosis not present

## 2016-06-13 DIAGNOSIS — F418 Other specified anxiety disorders: Secondary | ICD-10-CM

## 2016-06-13 DIAGNOSIS — I10 Essential (primary) hypertension: Secondary | ICD-10-CM | POA: Diagnosis not present

## 2016-06-13 DIAGNOSIS — F32A Depression, unspecified: Secondary | ICD-10-CM

## 2016-06-13 DIAGNOSIS — F419 Anxiety disorder, unspecified: Secondary | ICD-10-CM

## 2016-06-13 DIAGNOSIS — F329 Major depressive disorder, single episode, unspecified: Secondary | ICD-10-CM

## 2016-06-13 LAB — HEMOGLOBIN A1C: Hgb A1c MFr Bld: 7.7 % — ABNORMAL HIGH (ref 4.6–6.5)

## 2016-06-13 MED ORDER — LISINOPRIL-HYDROCHLOROTHIAZIDE 20-25 MG PO TABS: 1.0000 | ORAL_TABLET | Freq: Two times a day (BID) | ORAL | 2 refills | Status: DC

## 2016-06-13 MED ORDER — BUPROPION HCL ER (SMOKING DET) 150 MG PO TB12: 150.0000 mg | ORAL_TABLET | Freq: Two times a day (BID) | ORAL | 1 refills | Status: DC

## 2016-06-13 MED ORDER — OXYCODONE HCL 5 MG PO TABS: 5.0000 mg | ORAL_TABLET | Freq: Four times a day (QID) | ORAL | 0 refills | Status: DC | PRN

## 2016-06-13 NOTE — Assessment & Plan Note (Signed)
Stable. Refill bupropion. Continue to follow with psychiatry.

## 2016-06-13 NOTE — Assessment & Plan Note (Signed)
Check A1c. Continue current medications. 

## 2016-06-13 NOTE — Assessment & Plan Note (Signed)
At goal. Patient will confirm whether or not he is taking metoprolol and propranolol. He will call us once he knows this. He'll continue his lisinopril and HCTZ.

## 2016-06-13 NOTE — Patient Instructions (Signed)
Nice to see you. Please see the pain management specialist as scheduled tomorrow. We'll provide a refill of your oxycodone. Please contact your GI physician to get an appointment. Please set up an eye appointment as well. Please check to see if your are taking propranolol or metoprolol or taking both. Please contact us to let us know this.

## 2016-06-13 NOTE — Progress Notes (Signed)
James Rumps, MD Phone: [phone]  James Hood is a 76 y.o. male who presents today for f/u.  HYPERTENSION  Disease Monitoring  Home BP Monitoring not checking Chest pain- no    Dyspnea- no Medications  Compliance-  Taking lisinopril/HCTZ, possibly taking metoprolol and propranolol.  Edema- no  DIABETES Disease Monitoring: Blood Sugar ranges-not checking Polyuria/phagia/dipsia- no      optho- is due for a visit Medications: Compliance- taking metformin and amaryl Hypoglycemic symptoms- no  Back pain: Chronic. Stable. Sees pain management tomorrow for a caudal injection. Oxycodone has been helpful. Is taking it 3 times a day. Once he took it 10 mg at one time. Notes some days it makes it hard for him to walk due to the pain. He does note the oxycodone does help but does not take the pain always weight. No numbness or weakness.  Barrett's esophagus: Patient has a history of this. Follows with GI [facility]. Has had a yearly EGD. Takes Prilosec and has minimal reflux with this. No blood in the stool. Needs an appointment with his GI physician for follow-up.  Patient additionally notes he needs a refill on his bupropion. He has been on this for anxiety and depression. His prior PCP was managing his bupropion. His other psychiatry medicines are managed through psychiatry. He notes his anxiety and depression is relatively well controlled. No SI or HI.   PMH: Smoker   ROS see history of present illness  Objective  Physical Exam Vitals:   06/13/16 0802  BP: 110/68  Pulse: 77  Temp: 98.1 F (36.7 C)    BP Readings from Last 3 Encounters:  06/13/16 110/68  05/29/16 128/72  05/15/16 132/84   Wt Readings from Last 3 Encounters:  06/13/16 192 lb (87.1 kg)  05/29/16 193 lb 9.6 oz (87.8 kg)  05/15/16 199 lb 9.6 oz (90.5 kg)    Physical Exam  Constitutional: No distress.  Cardiovascular: Normal rate, regular rhythm and normal heart sounds.   Pulmonary/Chest: Effort normal  and breath sounds normal.  Abdominal: Soft. Bowel sounds are normal. He exhibits no distension. There is no tenderness.  Musculoskeletal:  No midline spine tenderness, no midline spine step-off, no muscular back tenderness  Neurological: He is alert. Gait normal.  5 out of 5 strength bilateral quads, hamstrings, plantar flexion, and dorsiflexion, sensation light touch intact in bilateral lower extremities  Skin: Skin is warm and dry. He is not diaphoretic.     Assessment/Plan: Please see individual problem list.  Type 2 diabetes mellitus (HCC) Check A1c. Continue current medications.  Hypertension At goal. Patient will confirm whether or not he is taking metoprolol and propranolol. He will call us once he knows this. He'll continue his lisinopril and HCTZ.  Lumbar herniated disc Due for injection tomorrow. I refilled his oxycodone as he has gotten some relief with this. The drug database is reviewed and is appropriate. Discussed that his oxycodone prescriptions at some point in the future will need to come through pain management.  Anxiety and depression Stable. Refill bupropion. Continue to follow with psychiatry.  Barrett's esophagus Stable. Continue Prilosec. Advised to contact his GI physician for his yearly visit.   Orders Placed This Encounter  Procedures  . HgB A1c    Meds ordered this encounter  Medications  . lisinopril-hydrochlorothiazide (PRINZIDE,ZESTORETIC) 20-25 MG tablet    Sig: Take 1 tablet by mouth 2 (two) times daily.    Dispense:  180 tablet    Refill:  2  . oxyCODONE (  OXY IR/ROXICODONE) 5 MG immediate release tablet    Sig: Take 1 tablet (5 mg total) by mouth every 6 (six) hours as needed for severe pain.    Dispense:  45 tablet    Refill:  0  . buPROPion (ZYBAN) 150 MG 12 hr tablet    Sig: Take 1 tablet (150 mg total) by mouth 2 (two) times daily.    Dispense:  180 tablet    Refill:  [place], MD [place]

## 2016-06-13 NOTE — Assessment & Plan Note (Signed)
Stable. Continue Prilosec. Advised to contact his GI physician for his yearly visit.

## 2016-06-13 NOTE — Progress Notes (Signed)
Pre visit review using our clinic review tool, if applicable. No additional management support is needed unless otherwise documented below in the visit note. 

## 2016-06-13 NOTE — Assessment & Plan Note (Signed)
Due for injection tomorrow. I refilled his oxycodone as he has gotten some relief with this. The drug database is reviewed and is appropriate. Discussed that his oxycodone prescriptions at some point in the future will need to come through pain management.

## 2016-06-14 ENCOUNTER — Ambulatory Visit
Admission: RE | Admit: 2016-06-14 | Discharge: 2016-06-14 | Disposition: A | Discharge status: Home / Self Care | Payer: PPO | Source: Ambulatory Visit | Attending: Anesthesiology | Admitting: Anesthesiology

## 2016-06-14 ENCOUNTER — Encounter: Payer: Self-pay | Admitting: Anesthesiology

## 2016-06-14 ENCOUNTER — Ambulatory Visit (HOSPITAL_BASED_OUTPATIENT_CLINIC_OR_DEPARTMENT_OTHER): Payer: PPO | Admitting: Anesthesiology

## 2016-06-14 ENCOUNTER — Other Ambulatory Visit: Payer: Self-pay | Admitting: Anesthesiology

## 2016-06-14 DIAGNOSIS — G8929 Other chronic pain: Secondary | ICD-10-CM

## 2016-06-14 DIAGNOSIS — M5442 Lumbago with sciatica, left side: Secondary | ICD-10-CM

## 2016-06-14 DIAGNOSIS — M539 Dorsopathy, unspecified: Secondary | ICD-10-CM

## 2016-06-14 DIAGNOSIS — M5386 Other specified dorsopathies, lumbar region: Secondary | ICD-10-CM

## 2016-06-14 DIAGNOSIS — R52 Pain, unspecified: Secondary | ICD-10-CM

## 2016-06-14 MED ORDER — TRIAMCINOLONE ACETONIDE 40 MG/ML IJ SUSP
40.0000 mg | Freq: Once | INTRAMUSCULAR | Status: AC
  Administered 2016-06-14: 40 mg
  Filled 2016-06-14: qty 1

## 2016-06-14 MED ORDER — IOPAMIDOL (ISOVUE-M 200) INJECTION 41%: 20.0000 mL | Freq: Once | INTRAMUSCULAR | Status: DC | PRN

## 2016-06-14 MED ORDER — LIDOCAINE HCL (PF) 1 % IJ SOLN
5.0000 mL | Freq: Once | INTRAMUSCULAR | Status: AC
  Administered 2016-06-14: 5 mL via SUBCUTANEOUS
  Filled 2016-06-14: qty 5

## 2016-06-14 MED ORDER — MIDAZOLAM HCL 5 MG/5ML IJ SOLN
5.0000 mg | Freq: Once | INTRAMUSCULAR | Status: AC
  Administered 2016-06-14: 3 mg via INTRAVENOUS
  Filled 2016-06-14: qty 5

## 2016-06-14 MED ORDER — IOPAMIDOL (ISOVUE-M 200) INJECTION 41%
INTRAMUSCULAR | Status: AC
  Administered 2016-06-14: 14:00:00
  Filled 2016-06-14: qty 10

## 2016-06-14 MED ORDER — SODIUM CHLORIDE 0.9 % IJ SOLN
INTRAMUSCULAR | Status: AC
  Administered 2016-06-14: 14:00:00
  Filled 2016-06-14: qty 10

## 2016-06-14 MED ORDER — ROPIVACAINE HCL 2 MG/ML IJ SOLN
10.0000 mL | Freq: Once | INTRAMUSCULAR | Status: AC
  Administered 2016-06-14: 10 mL via EPIDURAL
  Filled 2016-06-14: qty 10

## 2016-06-14 MED ORDER — SODIUM CHLORIDE 0.9% FLUSH
10.0000 mL | Freq: Once | INTRAVENOUS | Status: AC
  Administered 2016-06-14: 10 mL

## 2016-06-14 MED ORDER — LACTATED RINGERS IV SOLN
1000.0000 mL | INTRAVENOUS | Status: DC
  Administered 2016-06-14: 1000 mL via INTRAVENOUS

## 2016-06-14 NOTE — Progress Notes (Signed)
Subjective:  Patient ID: James Hood, male    DOB: 14-Jan-1941  Age: 76 y.o. MRN: [id number]  CC: Hip Pain (lefr)   Service Provided on Last Visit: Med Refill  PROCEDURE:Caudal epidural steroid No. 1 under fluoroscopic guidance with moderate sedation  HPI James Hood returns to clinic today for his first caudal epidural steroid injection. No significant change in lower extremity strength function or the quality characteristic or distribution of his pain are noted.   By history  He is a pleasant 76 year old white male with a long-standing history of low back pain for over one year. He reports that he had a laminectomy in October of this year with Dr. Joya Salm. He also states that he has continued to have lancinating left posterior and lateral leg pain described as sharp nagging pulsating and throbbing it's present throughout much of the day. He rates this as a VAS of 7 best a 2 and now a 5. He feels that it is gotten worse. He has some chronic intermittent weakness of the left leg. He had an MRI that was reviewed with him today that is in follow-up to his October surgery. He also reports having had 3 epidural steroids prior to his surgery and 1 L5-S1 epidural after this. The pain is worse with bending climbing motion squatting and better with rest sleep. 5 mg of oxycodone does not give him much relief. We have reviewed his physician database and there are no concerns. He also takes Neurontin 1200 mg 3 times a day.  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   His family history includes Depression in his father; Heart attack in his mother; Heart disease in his mother; Hypertension in his father.He reports that he has quit smoking. His smoking use included Cigarettes. He has a 60.00 pack-year smoking history. He has never used smokeless tobacco. He reports that he does not drink alcohol or use drugs.  No results found for this or any previous visit.  [employer] Select 13  Date Value Ref Range Status  05/03/2016 FINAL  Final    Comment:    ==================================================================== [employer] SELECT 13 (MW) ==================================================================== Test                             Result       Flag       Units Drug Present and Declared for Prescription Verification   Oxycodone                      140          EXPECTED   ng/mg creat   Oxymorphone                    127          EXPECTED   ng/mg creat   Noroxycodone                   903          EXPECTED  ng/mg creat   Noroxymorphone                 71           EXPECTED   ng/mg creat    Sources of oxycodone are scheduled prescription medications.    Oxymorphone, noroxycodone, and noroxymorphone are expected    metabolites of oxycodone. Oxymorphone is also available as a    scheduled prescription medication.   Phenobarbital                  PRESENT      EXPECTED    Phenobarbital is an expected metabolite of primidone;    Phenobarbital may also be administered as a prescription drug. Drug Absent but Declared for Prescription Verification   Desmethyldiazepam              Not Detected UNEXPECTED ng/mg creat    Desmethyldiazepam is an expected metabolite of chlordiazepoxide,    clorazepate, halazepam, and prazepam.   Hydrocodone                    Not Detected UNEXPECTED ng/mg  creat   Tramadol                       Not Detected UNEXPECTED ==================================================================== Test                      Result    Flag   Units      Ref Range   Creatinine              75               mg/dL      >=20 ==================================================================== Declared Medications:  The flagging and interpretation on this report are based on the  following declared medications.  Unexpected results may arise from  inaccuracies in the declared medications.  **Note: The testing scope of this panel includes these medications:  Clorazepate (Tranxene)  Hydrocodone (Norco)  Oxycodone  Oxycodone (Percocet)  Primidone (Mysoline)  Tramadol (Ultram)  **Note: The testing scope of this panel does not include following  reported medications:  Acetaminophen (Norco)  Acetaminophen (Percocet)  Acetaminophen (Tylenol)  Aspirin  Atropine (Lomotil)  Bupropion (Zyban)  Colestipol  Cyclobenzaprine (Flexeril)  Diphenoxylate (Lomotil)  Divalproex (Depakote)  Donepezil (Aricept)  Fenofibrate (Tricor)  Finasteride (Proscar)  Gabapentin (Neurontin)  Glimepiride (Amaryl)  Hydrochlorothiazide (Prinzide)  Hydrochlorothiazide (Zestoretic)  Lisinopril (Prinzide)  Lisinopril (Zestoretic)  Memantine (Namenda)  Metformin (Glucophage)  Metoprolol (Lopressor)  Metoprolol (Toprol)  Omeprazole (Prilosec)  Prednisone (Sterapred)  Propranolol (Inderal)  Quetiapine (Seroquel)  Simvastatin (Zocor)  Tamsulosin (Flomax) ==================================================================== For clinical consultation, please call [phone]. ====================================================================     Outpatient Medications Prior to Visit  Medication Sig Dispense Refill  . acetaminophen (TYLENOL) 500 MG tablet Take 1,000 mg by mouth as needed.     Marland Kitchen aspirin EC 81 MG tablet Take 81 mg by mouth every morning.     Marland Kitchen buPROPion  (ZYBAN) 150 MG 12 hr tablet Take 1 tablet (150 mg total) by mouth 2 (two) times daily. 180 tablet 1  . clorazepate (TRANXENE) 3.75 MG tablet Take 1 tablet (3.75 mg total) by mouth at bedtime as needed for anxiety. 30 tablet 0  . colestipol (COLESTID) 1 g tablet Take 2 g by mouth daily.     . cyclobenzaprine (FLEXERIL) 10 MG tablet Take 10 mg by mouth 3 (three) times daily as needed for  muscle spasms.    . diphenoxylate-atropine (LOMOTIL) 2.5-0.025 MG tablet Take 1-2 tablets by mouth 2 (two) times daily. Take 2 tablets in the morning, and 1 tablet in the evening    . divalproex (DEPAKOTE ER) 250 MG 24 hr tablet Take 250 mg by mouth 2 (two) times daily. Pt takes 250mg  in the morning, and 250mg  in the afternoon    . donepezil (ARICEPT) 10 MG tablet Take 10 mg by mouth at bedtime.    . fenofibrate (TRICOR) 145 MG tablet Take 1 tablet (145 mg total) by mouth daily. 90 tablet 1  . finasteride (PROSCAR) 5 MG tablet Take 1 tablet (5 mg total) by mouth daily. 30 tablet 11  . gabapentin (NEURONTIN) 600 MG tablet Take 2 tablets (1,200 mg total) by mouth 3 (three) times daily. 180 tablet 3  . glimepiride (AMARYL) 4 MG tablet TAKE ONE TABLET BY MOUTH TWO TIMES A DAY 180 tablet 0  . lisinopril-hydrochlorothiazide (PRINZIDE,ZESTORETIC) 20-25 MG tablet Take 1 tablet by mouth 2 (two) times daily. 180 tablet 2  . memantine (NAMENDA) 10 MG tablet     . metoprolol succinate (TOPROL-XL) 25 MG 24 hr tablet     . nicotine (NICODERM CQ - DOSED IN MG/24 HOURS) 21 mg/24hr patch Place 1 patch (21 mg total) onto the skin daily. 42 patch 0  . omeprazole (PRILOSEC OTC) 20 MG tablet Take 20 mg by mouth 2 (two) times daily.    Marland Kitchen oxyCODONE (OXY IR/ROXICODONE) 5 MG immediate release tablet Take 1 tablet (5 mg total) by mouth every 6 (six) hours as needed for severe pain. 45 tablet 0  . primidone (MYSOLINE) 50 MG tablet Take 150-200 mg by mouth See admin instructions. Pt takes 200mg  in the morning, and 150mg  in the evening    .  propranolol (INDERAL) 60 MG tablet Take 60 mg by mouth 2 (two) times daily.     . QUEtiapine (SEROQUEL) 25 MG tablet Take 75 mg by mouth at bedtime.     . simvastatin (ZOCOR) 40 MG tablet Take 1 tablet (40 mg total) by mouth daily. 90 tablet 1  . tamsulosin (FLOMAX) 0.4 MG CAPS capsule Take 0.4 mg by mouth daily after supper.     . divalproex (DEPAKOTE ER) 500 MG 24 hr tablet     . doxycycline (VIBRA-TABS) 100 MG tablet Take 1 tablet (100 mg total) by mouth 2 (two) times daily. (Patient not taking: Reported on 06/14/2016) 14 tablet 0  . memantine (NAMENDA) 10 MG tablet Take by mouth.    . memantine (NAMENDA) 5 MG tablet Take 5 mg by mouth 2 (two) times daily.    . metFORMIN (GLUCOPHAGE) 500 MG tablet Take 2 tablets (1,000 mg total) by mouth 2 (two) times daily with a meal. 180 tablet 3  . metoprolol tartrate (LOPRESSOR) 25 MG tablet Take 25 mg by mouth daily.     Marland Kitchen omeprazole (PRILOSEC) 20 MG capsule     . primidone (MYSOLINE) 50 MG tablet TAKE 5 TABLETS IN THE MORNING AND 3 TABLETS IN THE EVENING     No facility-administered medications prior to visit.    Lab Results  Component Value Date   WBC 6.5 01/25/2016   HGB 11.7 (L) 01/25/2016   HCT 36.1 (L) 01/25/2016   PLT 267 01/25/2016   GLUCOSE 187 (H) 05/29/2016   CHOL 161 09/16/2015   TRIG 393.0 (H) 09/16/2015   HDL 38.50 (L) 09/16/2015   LDLDIRECT 66.0 09/16/2015   ALT 20 03/13/2016   AST  21 03/13/2016   NA 137 05/29/2016   K 4.3 05/29/2016   CL 96 05/29/2016   CREATININE 1.33 05/29/2016   BUN 31 (H) 05/29/2016   CO2 30 05/29/2016   PSA 0.07 (L) 09/16/2015   INR 1.08 11/05/2014   HGBA1C 7.7 (H) 06/13/2016    --------------------------------------------------------------------------------------------------------------------- Mr Lumbar Spine Wo Contrast  Result Date: 03/29/2016 CLINICAL DATA:  Initial evaluation for chronic low back pain with left-sided sciatica, worsened. EXAM: MRI LUMBAR SPINE WITHOUT CONTRAST TECHNIQUE:  Multiplanar, multisequence MR imaging of the lumbar spine was performed. No intravenous contrast was administered. COMPARISON:  Prior MRI from 09/22/2015. FINDINGS: Segmentation: L normal segmentation. Lowest well-formed disc is labeled the L5-S1 level. Same numbering system is employed as on previous exams. Alignment: Straightening with slight reversal of the normal lumbar lordosis, stable. No listhesis. Vertebrae: Vertebral body heights are maintained. No evidence for acute or chronic fracture. Reactive endplate changes about the left aspect of the L5-S1 interspace, similar to previous. Postoperative changes from recent decompressive left hemi laminectomy with micro discectomy seen at L5-S1. No concerning features identified. Conus medullaris: Extends to the L1 level and appears normal. Paraspinal and other soft tissues: Normal expected postoperative no concerning features identified. Paraspinous soft tissues otherwise within normal limits. Visualized visceral structures are normal. No retroperitoneal adenopathy. Changes related to decompressive laminectomy present within the posterior paraspinous soft tissues of the lower back. Disc levels: L1-2: Degenerative disc desiccation with intervertebral disc space narrowing and mild disc bulge. No stenosis. L2-3: Degenerative disc desiccation with intervertebral disc space narrowing and mild disc bulge. No stenosis. L3-4: Degenerative disc desiccation with mild diffuse disc bulge. Mild facet hypertrophy. Mild right foraminal stenosis related to disc bulge and facet disease, stable. No canal or left foraminal narrowing. L4-5: Degenerative disc bulge with intervertebral disc space narrowing and disc desiccation. Disc bulging a centric to the right without focal disc protrusion. Superimposed bilateral facet arthrosis with ligamentum flavum hypertrophy. Reactive effusions within the bilateral L4-5 facets, stable. Minimal bilateral subarticular stenosis, slightly greater on  the left. Mild bilateral foraminal narrowing related to disc bulge and facet disease, stable. L5-S1: Postoperative changes from interval decompressive left hemi laminectomy with micro discectomy. Previously seen left subarticular disc protrusion has been largely resected, although there appears to be a small residual central disc protrusion that minimally indents the ventral thecal sac (series 6, image 28). Soft tissue density within the left lateral epidural space likely reflects postoperative granulation tissue, although evaluation somewhat limited due to lack of IV contrast. This surrounds the transiting S1 nerve root in the left lateral recess (series 7, image 28). No significant canal stenosis. Superimposed bilateral facet arthrosis with reactive effusions in the bilateral L5-S1 facets is similar to previous. Mild left foraminal narrowing is relatively unchanged. No significant right foraminal stenosis. IMPRESSION: 1. Postoperative changes from recent decompressive left hemi laminectomy with microdiskectomy at L5-S1. Postoperative granulation tissue within the left epidural space, surrounding the descending left S1 nerve root. No residual stenosis or concerning features identified. 2. 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 No other significant stenosis or evidence for impingement. Electronically Signed   By: Jeannine Boga M.D.   On: 03/29/2016 13:10       ---------------------------------------------------------------------------------------------------------------------- Past Medical History:  Diagnosis Date  . Anxiety   . Anxiety and depression   . Arthritis   . Colon polyps   . Dementia   . Depression   . Diabetes mellitus type 2 in nonobese (  [facility])   . GERD (gastroesophageal reflux disease)   . Headache   . 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[place])   . History of hiatal hernia   . Hypercholesteremia   . Hypertension   .  Hyperthyroidism   . Neuropathic pain   . Primary localized osteoarthritis of right knee   . Stomach ulcer   . Tremor, essential     Past Surgical History:  Procedure Laterality Date  . esophageal stretch    . JOINT REPLACEMENT    . LUMBAR LAMINECTOMY/DECOMPRESSION MICRODISCECTOMY Left 02/03/2016   Procedure: LEFT L5-S1 DISKECTOMY;  Surgeon: Leeroy Cha, MD;  Location: [facility];  Service: Neurosurgery;  Laterality: Left;  LEFT L5-S1 DISKECTOMY  . TONSILLECTOMY    . TOTAL KNEE ARTHROPLASTY Right 11/15/2014   Procedure: TOTAL KNEE ARTHROPLASTY;  Surgeon: Elsie Saas, MD;  Location: [facility];  Service: Orthopedics;  Laterality: Right;    Family History  Problem Relation Age of Onset  . Heart attack Mother   . Heart disease Mother   . Hypertension Father   . Depression Father   . Kidney cancer Neg Hx   . Prostate cancer Neg Hx     Social History  Substance Use Topics  . Smoking status: Former Smoker    Packs/day: 1.00    Years: 60.00    Types: Cigarettes  . Smokeless tobacco: Never Used  . 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 Marital status: Married    Spouse name: N/A  . Number of children: N/A  . Years of education: N/A   Social History Main Topics  . Smoking status: Former Smoker    Packs/day: 1.00    Years: 60.00    Types: Cigarettes  . Smokeless tobacco: Never Used  . 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 Drug use: No  . Sexual activity: Not Currently   Other Topics Concern  . None   Social History Narrative  . None    Scheduled Meds: Continuous Infusions: PRN Meds:.   BP 115/74   Pulse 75   Temp 98.1 F (36.7 C) (Oral)   Resp 16   Ht 5\' 10"  (1.778 m)   Wt 194 lb (88 kg)   SpO2 97%   BMI 27.84 kg/m    BP Readings from Last 3  Encounters:  06/14/16 115/74  06/13/16 110/68  05/29/16 128/72     Wt Readings from Last 3 Encounters:  06/14/16 194 lb (88 kg)  06/13/16 192 lb (87.1 kg)  05/29/16 193 lb 9.6 oz (87.8 kg)     ----------------------------------------------------------------------------------------------------------------------  ROS Review of Systems  Cardiac: No angina or shortness of breath GI: No constipation  Objective:  BP 115/74   Pulse 75   Temp 98.1 F (36.7 C) (Oral)   Resp 16   Ht 5\' 10"  (1.778 m)   Wt 194 lb (88 kg)   SpO2 97%   BMI 27.84 kg/m   Physical Exam Patient is alert oriented cooperative compliant Lungs are clear to auscultation Some mild paraspinous muscle tenderness in the lumbar region otherwise no change .     Assessment & Plan:   Kestutis was seen today for hip pain.  Diagnoses and all orders for this visit:  Sciatica of left side associated with disorder of lumbar spine -     Lumbar Epidural Injection -  triamcinolone acetonide (KENALOG-40) injection 40 mg; 1 mL (40 mg total) by Other route once. -     sodium chloride flush (NS) 0.9 % injection 10 mL; 10 mLs by Other route once. -     ropivacaine (PF) 2 mg/mL (0.2%) (NAROPIN) injection 10 mL; 10 mLs by Epidural route once. -     midazolam (VERSED) 5 MG/5ML injection 5 mg; Inject 5 mLs (5 mg total) into the vein once. -     lidocaine (PF) (XYLOCAINE) 1 % injection 5 mL; Inject 5 mLs into the skin once. -     lactated ringers infusion 1,000 mL; Inject 1,000 mLs into the vein continuous. -     iopamidol (ISOVUE-M) 41 % intrathecal injection 20 mL; 20 mLs by Other route once as needed for contrast. -     Lumbar Epidural Injection  Chronic left-sided low back pain with left-sided sciatica -     Lumbar Epidural Injection  Other orders -     iopamidol (ISOVUE-M) 41 % intrathecal injection;  -     sodium chloride 0.9 % injection;       ----------------------------------------------------------------------------------------------------------------------  Problem List Items Addressed This Visit      Other   Chronic lumbar pain   Relevant Medications   triamcinolone acetonide (KENALOG-40) injection 40 mg (Completed)    Other Visit Diagnoses    Sciatica of left side associated with disorder of lumbar spine       Relevant Medications   triamcinolone acetonide (KENALOG-40) injection 40 mg (Completed)   sodium chloride flush (NS) 0.9 % injection 10 mL (Completed)   ropivacaine (PF) 2 mg/mL (0.2%) (NAROPIN) injection 10 mL (Completed)   midazolam (VERSED) 5 MG/5ML injection 5 mg (Completed)   lidocaine (PF) (XYLOCAINE) 1 % injection 5 mL (Completed)   lactated ringers infusion 1,000 mL   iopamidol (ISOVUE-M) 41 % intrathecal injection 20 mL   Other Relevant Orders   Lumbar Epidural Injection      ----------------------------------------------------------------------------------------------------------------------  1. Sciatica of left side associated with disorder of lumbar spine We will proceed with a first caudal epidural today. The risks and benefits have been reviewed all questions answered and no guarantees made. We'll have her return to clinic in 1 month for repeat evaluation and possible repeat caudal epidural steroid injection.  2. Chronic left-sided low back pain with left-sided sciatica Ias above  ----------------------------------------------------------------------------------------------------------------------  I am having James Hood maintain his primidone, divalproex, metoprolol tartrate, cyclobenzaprine, omeprazole, memantine, donepezil, QUEtiapine, colestipol, aspirin EC, diphenoxylate-atropine, tamsulosin, propranolol, finasteride, clorazepate, acetaminophen, metFORMIN, fenofibrate, glimepiride, simvastatin, divalproex, memantine, metoprolol succinate, omeprazole, memantine, primidone,  gabapentin, doxycycline, nicotine, lisinopril-hydrochlorothiazide, oxyCODONE, and buPROPion. We administered triamcinolone acetonide, sodium chloride flush, ropivacaine (PF) 2 mg/mL (0.2%), midazolam, lidocaine (PF), lactated ringers, iopamidol, and sodium chloride. We will continue to administer lactated ringers and iopamidol.   Meds ordered this encounter  Medications  . triamcinolone acetonide (KENALOG-40) injection 40 mg  . sodium chloride flush (NS) 0.9 % injection 10 mL  . ropivacaine (PF) 2 mg/mL (0.2%) (NAROPIN) injection 10 mL  . midazolam (VERSED) 5 MG/5ML injection 5 mg  . lidocaine (PF) (XYLOCAINE) 1 % injection 5 mL  . lactated ringers infusion 1,000 mL  . iopamidol (ISOVUE-M) 41 % intrathecal injection 20 mL  . iopamidol (ISOVUE-M) 41 % intrathecal injection    GARNER, CYNTHIA: cabinet override  . sodium chloride 0.9 % injection    GARNER, CYNTHIA: cabinet override    Procedure: Caudal epidural steroid No. 1 under fluoroscopic guidance with moderate sedationAfter  informed consent was obtained and the risks benefits reviewed patient chose to pursue a caudal epidural steroid injection today. With the patient in the prone position and an IV in place, sedation was with IV versed. Using AP and lateral fluoroscopic guidance I identified the area overlying the sacral hiatus. This area was broadly prepped with DuraPrep 3 and we utilized strict aseptic technique during the procedure. 1% lidocaine was infiltrated 2 cc using a 25-gauge needle overlying the sacral cornu and then using lateral fluoroscopic guidance I advanced an 18-gauge Touhy needle approximately 2 cm through the sacral hiatus. Confirmation was with 2 cc of Isovue yielding good epidural spread and no evidence of IV or subarachnoid uptake. This was followed by an injection of 5 cc of saline mixed with 1 cc of ropivacaine 0.2% and 40 mg of triamcinolone. This was tolerated without difficulty the patient was convalesced and  discharged home in stable condition for follow-up as mentioned. JA     Follow-up: Return for evaluation, procedure.    Molli Barrows, MD @DATE @  The [place] practitioner database for opioid medications on this patient has been reviewed by me and my staff   Greater than 50% of the total encounter time was spent in counseling and / or coordination of care.     This dictation was performed utilizing Systems analyst.  Please excuse any unintentional or mistaken typographical errors as a result.

## 2016-06-14 NOTE — Progress Notes (Signed)
Safety precautions to be maintained throughout the outpatient stay will include: orient to surroundings, keep bed in low position, maintain call bell within reach at all times, provide assistance with transfer out of bed and ambulation.  

## 2016-06-14 NOTE — Patient Instructions (Signed)
Epidural Steroid Injection Patient Information  Description: The epidural space surrounds the nerves as they exit the spinal cord.  In some patients, the nerves can be compressed and inflamed by a bulging disc or a tight spinal canal (spinal stenosis).  By injecting steroids into the epidural space, we can bring irritated nerves into direct contact with a potentially helpful medication.  These steroids act directly on the irritated nerves and can reduce swelling and inflammation which often leads to decreased pain.  Epidural steroids may be injected anywhere along the spine and from the neck to the low back depending upon the location of your pain.   After numbing the skin with local anesthetic (like Novocaine), a small needle is passed into the epidural space slowly.  You may experience a sensation of pressure while this is being done.  The entire block usually last less than 10 minutes.  Conditions which may be treated by epidural steroids:   Low back and leg pain  Neck and arm pain  Spinal stenosis  Post-laminectomy syndrome  Herpes zoster (shingles) pain  Pain from compression fractures  Preparation for the injection:  1. Do not eat any solid food or dairy products within 8 hours of your appointment.  2. You may drink clear liquids up to 3 hours before appointment.  Clear liquids include water, black coffee, juice or soda.  No milk or cream please. 3. You may take your regular medication, including pain medications, with a sip of water before your appointment  Diabetics should hold regular insulin (if taken separately) and take 1/2 normal NPH dos the morning of the procedure.  Carry some sugar containing items with you to your appointment. 4. A driver must accompany you and be prepared to drive you home after your procedure.  5. Bring all your current medications with your. 6. An IV may be inserted and sedation may be given at the discretion of the physician.   7. A blood pressure  cuff, EKG and other monitors will often be applied during the procedure.  Some patients may need to have extra oxygen administered for a short period. 8. You will be asked to provide medical information, including your allergies, prior to the procedure.  We must know immediately if you are taking blood thinners (like Coumadin/Warfarin)  Or if you are allergic to IV iodine contrast (dye). We must know if you could possible be pregnant.  Possible side-effects:  Bleeding from needle site  Infection (rare, may require surgery)  Nerve injury (rare)  Numbness & tingling (temporary)  Difficulty urinating (rare, temporary)  Spinal headache ( a headache worse with upright posture)  Light -headedness (temporary)  Pain at injection site (several days)  Decreased blood pressure (temporary)  Weakness in arm/leg (temporary)  Pressure sensation in back/neck (temporary)  Call if you experience:  Fever/chills associated with headache or increased back/neck pain.  Headache worsened by an upright position.  New onset weakness or numbness of an extremity below the injection site  Hives or difficulty breathing (go to the emergency room)  Inflammation or drainage at the infection site  Severe back/neck pain  Any new symptoms which are concerning to you  Please note:  Although the local anesthetic injected can often make your back or neck feel good for several hours after the injection, the pain will likely return.  It takes 3-7 days for steroids to work in the epidural space.  You may not notice any pain relief for at least that one week.    If effective, we will often do a series of three injections spaced 3-6 weeks apart to maximally decrease your pain.  After the initial series, we generally will wait several months before considering a repeat injection of the same type.  If you have any questions, please call [phone] [facility]  Discharge Instructions  General Discharge Instructions :  If you need to reach your doctor call: Monday-Friday 8:00 am - 4:00 pm at [phone] or toll free [phone].  After clinic hours [phone] to have operator reach doctor.  Bring all of your medication bottles to all your appointments in the pain clinic.  To cancel or reschedule your appointment with Pain Management please remember to call 24 hours in advance to avoid a fee.  Refer to the educational materials which you have been given on: General Risks, I had my Procedure. Discharge Instructions, Post Sedation.  Post Procedure Instructions:  The drugs you were given will stay in your system until tomorrow, so for the next 24 hours you should not drive, make any legal decisions or drink any alcoholic beverages.  You may eat anything you prefer, but it is better to start with liquids then soups and crackers, and gradually work up to solid foods.  Please notify your doctor immediately if you have any unusual bleeding, trouble breathing or pain that is not related to your normal pain.  Depending on the type of procedure that was done, some parts of your body may feel week and/or numb.  This usually clears up by tonight or the next day.  Walk with the use of an assistive device or accompanied by an adult for the 24 hours.  You may use ice on the affected area for the first 24 hours.  Put ice in a Ziploc bag and cover with a towel and place against area 15 minutes on 15 minutes off.  You may switch to heat after 24 hours. 

## 2016-06-15 ENCOUNTER — Telehealth: Payer: Self-pay | Admitting: *Deleted

## 2016-06-15 ENCOUNTER — Other Ambulatory Visit: Payer: Self-pay | Admitting: Family Medicine

## 2016-06-15 ENCOUNTER — Telehealth: Payer: Self-pay

## 2016-06-15 MED ORDER — SITAGLIPTIN PHOSPHATE 100 MG PO TABS: 100.0000 mg | ORAL_TABLET | Freq: Every day | ORAL | 3 refills | Status: DC

## 2016-06-15 NOTE — Telephone Encounter (Signed)
This was just sent to the pharmacy.

## 2016-06-15 NOTE — Telephone Encounter (Signed)
Please advise 

## 2016-06-15 NOTE — Telephone Encounter (Signed)
Post procedure phone call.   No answer.  

## 2016-06-15 NOTE — Telephone Encounter (Signed)
Pt was to receive a diabetic medication, however he stated the pharmacy did not receive the medication. [facility]  Pt contact [phone]

## 2016-06-18 NOTE — Telephone Encounter (Signed)
Patient received rx

## 2016-06-19 ENCOUNTER — Encounter: Payer: Self-pay | Admitting: Family Medicine

## 2016-06-19 ENCOUNTER — Ambulatory Visit (INDEPENDENT_AMBULATORY_CARE_PROVIDER_SITE_OTHER): Payer: PPO | Admitting: Family Medicine

## 2016-06-19 VITALS — BP 118/72 | HR 73 | Temp 97.7°F | Wt 195.2 lb

## 2016-06-19 DIAGNOSIS — R61 Generalized hyperhidrosis: Secondary | ICD-10-CM

## 2016-06-19 LAB — CBC WITH DIFFERENTIAL/PLATELET
Basophils Absolute: 0.1 10*3/uL (ref 0.0–0.1)
Basophils Relative: 0.8 % (ref 0.0–3.0)
Eosinophils Absolute: 0.1 10*3/uL (ref 0.0–0.7)
Eosinophils Relative: 1.1 % (ref 0.0–5.0)
HCT: 34.5 % — ABNORMAL LOW (ref 39.0–52.0)
Hemoglobin: 11.5 g/dL — ABNORMAL LOW (ref 13.0–17.0)
Lymphocytes Relative: 32.9 % (ref 12.0–46.0)
Lymphs Abs: 2.4 10*3/uL (ref 0.7–4.0)
MCHC: 33.3 g/dL (ref 30.0–36.0)
MCV: 94.7 fl (ref 78.0–100.0)
Monocytes Absolute: 0.7 10*3/uL (ref 0.1–1.0)
Monocytes Relative: 10.3 % (ref 3.0–12.0)
Neutro Abs: 3.9 10*3/uL (ref 1.4–7.7)
Neutrophils Relative %: 54.9 % (ref 43.0–77.0)
Platelets: 408 10*3/uL — ABNORMAL HIGH (ref 150.0–400.0)
RBC: 3.64 Mil/uL — ABNORMAL LOW (ref 4.22–5.81)
RDW: 13.8 % (ref 11.5–15.5)
WBC: 7.2 10*3/uL (ref 4.0–10.5)

## 2016-06-19 LAB — COMPREHENSIVE METABOLIC PANEL
ALT: 21 U/L (ref 0–53)
AST: 25 U/L (ref 0–37)
Albumin: 4.4 g/dL (ref 3.5–5.2)
Alkaline Phosphatase: 44 U/L (ref 39–117)
BUN: 46 mg/dL — ABNORMAL HIGH (ref 6–23)
CO2: 30 mEq/L (ref 19–32)
Calcium: 10 mg/dL (ref 8.4–10.5)
Chloride: 100 mEq/L (ref 96–112)
Creatinine, Ser: 1.15 mg/dL (ref 0.40–1.50)
GFR: 65.65 mL/min (ref >60.00)
Glucose, Bld: 117 mg/dL — ABNORMAL HIGH (ref 70–99)
Potassium: 4.5 mEq/L (ref 3.5–5.1)
Sodium: 137 mEq/L (ref 135–145)
Total Bilirubin: 0.3 mg/dL (ref 0.2–1.2)
Total Protein: 7.5 g/dL (ref 6.0–8.3)

## 2016-06-19 LAB — TSH: TSH: 1.01 u[IU]/mL (ref 0.35–4.50)

## 2016-06-19 LAB — SEDIMENTATION RATE: Sed Rate: 30 mm/hr — ABNORMAL HIGH (ref 0–20)

## 2016-06-19 NOTE — Assessment & Plan Note (Addendum)
Patient notes a years worth of night sweats. Weight has been stable since he saw me first in April of last year. No specific symptoms to indicate a cause. Benign exam today. Unknown cause. I did discuss potential causes for this. We will obtain lab work as outlined below. If negative lab work could consider CT chest abdomen and pelvis to evaluate for lymphoma. Patient will additionally try changing the temperature in the room that he sleeps in.

## 2016-06-19 NOTE — Patient Instructions (Signed)
Nice to see you. We'll get some lab work to evaluate your night sweats. You should monitor the temperature in the room that you're sleeping in and if it is too high try decreasing the temperature to see if this reduces or night sweats.

## 2016-06-19 NOTE — Progress Notes (Addendum)
  Tommi Rumps, MD Phone: [phone]  James Hood is a 76 y.o. male who presents today for same-day visit.  Night sweats: Patient notes over the last year he has to the point where he had to change his had night sweats. At first they were drenching night sweats to the point that he had to change his T-shirt. More recently it's been dampness to his T-shirt and pillow. He notes last colonoscopy was 2-3 years ago. PSA last year was normal. No family history of cancer. He did smoke 1 pack a day for about 60 years. Recently had a chest x-ray was reassuring. No IV drug use. No contact with tuberculosis, hepatitis C, or HIV. He reports some weight loss prior to coming to see me last year though he had changed his diet and stop snacking. He notes no cough productive productive of blood. He does report he may be keeps it too warm in his room. Does not sleep with an excessive number of blankets.  PMH: Quit smoking 5 days ago   ROS see history of present illness  Objective  Physical Exam Vitals:   06/19/16 1318  BP: 118/72  Pulse: 73  Temp: 97.7 F (36.5 C)    BP Readings from Last 3 Encounters:  06/19/16 118/72  06/14/16 115/74  06/13/16 110/68   Wt Readings from Last 3 Encounters:  06/19/16 195 lb 3.2 oz (88.5 kg)  06/14/16 194 lb (88 kg)  06/13/16 192 lb (87.1 kg)    Physical Exam  Constitutional: No distress.  HENT:  Head: Normocephalic and atraumatic.  Mouth/Throat: Oropharynx is clear and moist. No oropharyngeal exudate.  Eyes: Conjunctivae are normal. Pupils are equal, round, and reactive to light.  Cardiovascular: Normal rate, regular rhythm and normal heart sounds.   Pulmonary/Chest: Effort normal and breath sounds normal.  Abdominal: Soft. Bowel sounds are normal. He exhibits no distension. There is no tenderness. There is no rebound and no guarding.  Musculoskeletal: He exhibits no edema.  Lymphadenopathy:       Head (right side): No submental and no  submandibular adenopathy present.       Head (left side): No submental and no submandibular adenopathy present.    He has no cervical adenopathy.    He has no axillary adenopathy.       Right: No inguinal and no supraclavicular adenopathy present.       Left: No inguinal and no supraclavicular adenopathy present.  Neurological: He is alert. Gait normal.  Skin: Skin is warm and dry. He is not diaphoretic.     Assessment/Plan: Please see individual problem list.  Night sweats Patient notes a years worth of night sweats. Weight has been stable since he saw me first in April of last year. No specific symptoms to indicate a cause. Benign exam today. Unknown cause. I did discuss potential causes for this. We will obtain lab work as outlined below. If negative lab work could consider CT chest abdomen and pelvis to evaluate for lymphoma. Patient will additionally try changing the temperature in the room that he sleeps in.   Orders Placed This Encounter  Procedures  . Comp Met (CMET)  . TSH  . HIV antibody (with reflex)  . Sed Rate (ESR)  . CBC w/Diff    Tommi Rumps, MD [place]

## 2016-06-19 NOTE — Progress Notes (Signed)
Pre visit review using our clinic review tool, if applicable. No additional management support is needed unless otherwise documented below in the visit note. 

## 2016-06-20 LAB — HIV ANTIBODY (ROUTINE TESTING W REFLEX): HIV 1&2 Ab, 4th Generation: NONREACTIVE

## 2016-06-28 ENCOUNTER — Other Ambulatory Visit: Payer: Self-pay | Admitting: Family Medicine

## 2016-06-28 NOTE — Telephone Encounter (Signed)
Pt needs a refill on oxyCODONE (OXY IR/ROXICODONE) 5 MG immediate release.Marland Kitchen Please advise

## 2016-06-29 MED ORDER — OXYCODONE HCL 5 MG PO TABS: 5.0000 mg | ORAL_TABLET | Freq: Four times a day (QID) | ORAL | 0 refills | Status: DC | PRN

## 2016-06-29 NOTE — Telephone Encounter (Signed)
Last OV 06/19/16 last filled 06/13/16 45 0rf

## 2016-06-29 NOTE — Telephone Encounter (Signed)
Pt has requested a update on this medication Pt contact [phone]

## 2016-06-29 NOTE — Telephone Encounter (Signed)
Called to inform rx is ready, unable to leave message

## 2016-06-29 NOTE — Telephone Encounter (Signed)
Informed patient that we have had clinic all morning and that I will call as soon as it is ready

## 2016-07-04 ENCOUNTER — Ambulatory Visit: Payer: PPO | Admitting: Anesthesiology

## 2016-07-09 ENCOUNTER — Telehealth: Payer: Self-pay | Admitting: Family Medicine

## 2016-07-09 DIAGNOSIS — D75839 Thrombocytosis, unspecified: Secondary | ICD-10-CM

## 2016-07-09 DIAGNOSIS — D473 Essential (hemorrhagic) thrombocythemia: Secondary | ICD-10-CM

## 2016-07-09 NOTE — Telephone Encounter (Signed)
Pt called and stated that Dr. Caryl Bis wanted him to come back in for labs. Pt was last seen on 1/30, looks like it was for some platelets. Please advise, thank you!  Call pt @ [phone]

## 2016-07-09 NOTE — Telephone Encounter (Signed)
Patient is scheduled for recheck lab, please place order

## 2016-07-09 NOTE — Telephone Encounter (Signed)
Order placed

## 2016-07-13 ENCOUNTER — Telehealth: Payer: Self-pay | Admitting: Family Medicine

## 2016-07-13 ENCOUNTER — Other Ambulatory Visit (INDEPENDENT_AMBULATORY_CARE_PROVIDER_SITE_OTHER): Payer: PPO

## 2016-07-13 DIAGNOSIS — D473 Essential (hemorrhagic) thrombocythemia: Secondary | ICD-10-CM

## 2016-07-13 DIAGNOSIS — D75839 Thrombocytosis, unspecified: Secondary | ICD-10-CM

## 2016-07-13 LAB — CBC
HCT: 34 % — ABNORMAL LOW (ref 39.0–52.0)
Hemoglobin: 11.6 g/dL — ABNORMAL LOW (ref 13.0–17.0)
MCHC: 34 g/dL (ref 30.0–36.0)
MCV: 95 fl (ref 78.0–100.0)
Platelets: 354 10*3/uL (ref 150.0–400.0)
RBC: 3.58 Mil/uL — ABNORMAL LOW (ref 4.22–5.81)
RDW: 13.8 % (ref 11.5–15.5)
WBC: 7.8 10*3/uL (ref 4.0–10.5)

## 2016-07-13 NOTE — Telephone Encounter (Signed)
Pt dropped off a handicapp renewal form to be completed out by Dr. Caryl Bis. Paper is up front in Dr. Ellen Henri color folder.

## 2016-07-13 NOTE — Telephone Encounter (Signed)
noted 

## 2016-07-16 ENCOUNTER — Other Ambulatory Visit: Payer: Self-pay

## 2016-07-16 MED ORDER — GLIMEPIRIDE 4 MG PO TABS: 4.0000 mg | ORAL_TABLET | Freq: Two times a day (BID) | ORAL | 3 refills | Status: DC

## 2016-07-16 NOTE — Telephone Encounter (Signed)
Last OV 06/19/2016 last filled by Staci Acosta, NP 04/02/16 180

## 2016-07-18 ENCOUNTER — Encounter: Payer: Self-pay | Admitting: Anesthesiology

## 2016-07-18 ENCOUNTER — Ambulatory Visit
Admission: RE | Admit: 2016-07-18 | Discharge: 2016-07-18 | Disposition: A | Discharge status: Home / Self Care | Payer: PPO | Source: Ambulatory Visit | Attending: Anesthesiology | Admitting: Anesthesiology

## 2016-07-18 ENCOUNTER — Ambulatory Visit (HOSPITAL_BASED_OUTPATIENT_CLINIC_OR_DEPARTMENT_OTHER): Payer: PPO | Admitting: Anesthesiology

## 2016-07-18 ENCOUNTER — Other Ambulatory Visit: Payer: Self-pay | Admitting: Anesthesiology

## 2016-07-18 ENCOUNTER — Ambulatory Visit: Payer: PPO | Admitting: Anesthesiology

## 2016-07-18 VITALS — BP 106/93 | HR 67 | Temp 97.2°F | Resp 17 | Ht 70.0 in | Wt 200.0 lb

## 2016-07-18 DIAGNOSIS — M5442 Lumbago with sciatica, left side: Secondary | ICD-10-CM

## 2016-07-18 DIAGNOSIS — M5386 Other specified dorsopathies, lumbar region: Secondary | ICD-10-CM

## 2016-07-18 DIAGNOSIS — M47816 Spondylosis without myelopathy or radiculopathy, lumbar region: Secondary | ICD-10-CM

## 2016-07-18 DIAGNOSIS — R52 Pain, unspecified: Secondary | ICD-10-CM | POA: Insufficient documentation

## 2016-07-18 DIAGNOSIS — M539 Dorsopathy, unspecified: Secondary | ICD-10-CM

## 2016-07-18 DIAGNOSIS — M4696 Unspecified inflammatory spondylopathy, lumbar region: Secondary | ICD-10-CM

## 2016-07-18 DIAGNOSIS — G8929 Other chronic pain: Secondary | ICD-10-CM

## 2016-07-18 MED ORDER — LIDOCAINE HCL (PF) 1 % IJ SOLN
5.0000 mL | Freq: Once | INTRAMUSCULAR | Status: AC
  Administered 2016-07-18: 5 mL via SUBCUTANEOUS
  Filled 2016-07-18: qty 10

## 2016-07-18 MED ORDER — IOPAMIDOL (ISOVUE-M 200) INJECTION 41%
INTRAMUSCULAR | Status: AC
  Filled 2016-07-18: qty 10

## 2016-07-18 MED ORDER — MIDAZOLAM HCL 5 MG/5ML IJ SOLN
5.0000 mg | Freq: Once | INTRAMUSCULAR | Status: AC
  Administered 2016-07-18: 2 mg via INTRAVENOUS

## 2016-07-18 MED ORDER — ROPIVACAINE HCL 5 MG/ML IJ SOLN: 10.0000 mL | Freq: Once | INTRAMUSCULAR | Status: DC

## 2016-07-18 MED ORDER — IOPAMIDOL (ISOVUE-M 200) INJECTION 41%
20.0000 mL | Freq: Once | INTRAMUSCULAR | Status: DC | PRN
  Administered 2016-07-18: 10 mL
  Filled 2016-07-18: qty 20

## 2016-07-18 MED ORDER — LACTATED RINGERS IV SOLN
1000.0000 mL | INTRAVENOUS | Status: DC
  Administered 2016-07-18: 1000 mL via INTRAVENOUS

## 2016-07-18 MED ORDER — TRIAMCINOLONE ACETONIDE 40 MG/ML IJ SUSP
40.0000 mg | Freq: Once | INTRAMUSCULAR | Status: AC
  Administered 2016-07-18: 60 mg
  Filled 2016-07-18: qty 1

## 2016-07-18 MED ORDER — SODIUM CHLORIDE 0.9 % IJ SOLN
INTRAMUSCULAR | Status: AC
  Filled 2016-07-18: qty 10

## 2016-07-18 MED ORDER — ROPIVACAINE HCL 5 MG/ML IJ SOLN
0.5000 mL | Freq: Once | INTRAMUSCULAR | Status: AC
  Administered 2016-07-18: 0.5 mL via EPIDURAL

## 2016-07-18 MED ORDER — FENTANYL CITRATE (PF) 100 MCG/2ML IJ SOLN
INTRAMUSCULAR | Status: AC
  Filled 2016-07-18: qty 2

## 2016-07-18 MED ORDER — ROPIVACAINE HCL 5 MG/ML IJ SOLN
INTRAMUSCULAR | Status: AC
  Filled 2016-07-18: qty 20

## 2016-07-18 MED ORDER — OXYCODONE-ACETAMINOPHEN 7.5-325 MG PO TABS: 1.0000 | ORAL_TABLET | Freq: Three times a day (TID) | ORAL | 0 refills | Status: DC

## 2016-07-18 MED ORDER — SODIUM CHLORIDE 0.9% FLUSH: 10.0000 mL | Freq: Once | INTRAVENOUS | Status: DC

## 2016-07-18 MED ORDER — MIDAZOLAM HCL 5 MG/5ML IJ SOLN
INTRAMUSCULAR | Status: AC
  Filled 2016-07-18: qty 5

## 2016-07-18 MED ORDER — TRIAMCINOLONE ACETONIDE 40 MG/ML IJ SUSP
INTRAMUSCULAR | Status: AC
  Filled 2016-07-18: qty 1

## 2016-07-18 NOTE — Patient Instructions (Signed)
GENERAL RISKS AND COMPLICATIONS  What are the risk, side effects and possible complications? Generally speaking, most procedures are safe.  However, with any procedure there are risks, side effects, and the possibility of complications.  The risks and complications are dependent upon the sites that are lesioned, or the type of nerve block to be performed.  The closer the procedure is to the spine, the more serious the risks are.  Great care is taken when placing the radio frequency needles, block needles or lesioning probes, but sometimes complications can occur. 1. Infection: Any time there is an injection through the skin, there is a risk of infection.  This is why sterile conditions are used for these blocks.  There are four possible types of infection. 1. Localized skin infection. 2. Central Nervous System Infection-This can be in the form of Meningitis, which can be deadly. 3. Epidural Infections-This can be in the form of an epidural abscess, which can cause pressure inside of the spine, causing compression of the spinal cord with subsequent paralysis. This would require an emergency surgery to decompress, and there are no guarantees that the patient would recover from the paralysis. 4. Discitis-This is an infection of the intervertebral discs.  It occurs in about 1% of discography procedures.  It is difficult to treat and it may lead to surgery.        2. Pain: the needles have to go through skin and soft tissues, will cause soreness.       3. Damage to internal structures:  The nerves to be lesioned may be near blood vessels or    other nerves which can be potentially damaged.       4. Bleeding: Bleeding is more common if the patient is taking blood thinners such as  aspirin, Coumadin, Ticiid, Plavix, etc., or if he/she have some genetic predisposition  such as hemophilia. Bleeding into the spinal canal can cause compression of the spinal  cord with subsequent paralysis.  This would require an  emergency surgery to  decompress and there are no guarantees that the patient would recover from the  paralysis.       5. Pneumothorax:  Puncturing of a lung is a possibility, every time a needle is introduced in  the area of the chest or upper back.  Pneumothorax refers to free air around the  collapsed lung(s), inside of the thoracic cavity (chest cavity).  Another two possible  complications related to a similar event would include: Hemothorax and Chylothorax.   These are variations of the Pneumothorax, where instead of air around the collapsed  lung(s), you may have blood or chyle, respectively.       6. Spinal headaches: They may occur with any procedures in the area of the spine.       7. Persistent CSF (Cerebro-Spinal Fluid) leakage: This is a rare problem, but may occur  with prolonged intrathecal or epidural catheters either due to the formation of a fistulous  track or a dural tear.       8. Nerve damage: By working so close to the spinal cord, there is always a possibility of  nerve damage, which could be as serious as a permanent spinal cord injury with  paralysis.       9. Death:  Although rare, severe deadly allergic reactions known as "Anaphylactic  reaction" can occur to any of the medications used.      10. Worsening of the symptoms:  We can always make thing worse.    What are the chances of something like this happening? Chances of any of this occuring are extremely low.  By statistics, you have more of a chance of getting killed in a motor vehicle accident: while driving to the hospital than any of the above occurring .  Nevertheless, you should be aware that they are possibilities.  In general, it is similar to taking a shower.  Everybody knows that you can slip, hit your head and get killed.  Does that mean that you should not shower again?  Nevertheless always keep in mind that statistics do not mean anything if you happen to be on the wrong side of them.  Even if a procedure has a 1  (one) in a 1,000,000 (million) chance of going wrong, it you happen to be that one..Also, keep in mind that by statistics, you have more of a chance of having something go wrong when taking medications.  Who should not have this procedure? If you are on a blood thinning medication (e.g. Coumadin, Plavix, see list of "Blood Thinners"), or if you have an active infection going on, you should not have the procedure.  If you are taking any blood thinners, please inform your physician.  How should I prepare for this procedure?  Do not eat or drink anything at least six hours prior to the procedure.  Bring a driver with you .  It cannot be a taxi.  Come accompanied by an adult that can drive you back, and that is strong enough to help you if your legs get weak or numb from the local anesthetic.  Take all of your medicines the morning of the procedure with just enough water to swallow them.  If you have diabetes, make sure that you are scheduled to have your procedure done first thing in the morning, whenever possible.  If you have diabetes, take only half of your insulin dose and notify our nurse that you have done so as soon as you arrive at the clinic.  If you are diabetic, but only take blood sugar pills (oral hypoglycemic), then do not take them on the morning of your procedure.  You may take them after you have had the procedure.  Do not take aspirin or any aspirin-containing medications, at least eleven (11) days prior to the procedure.  They may prolong bleeding.  Wear loose fitting clothing that may be easy to take off and that you would not mind if it got stained with Betadine or blood.  Do not wear any jewelry or perfume  Remove any nail coloring.  It will interfere with some of our monitoring equipment.  NOTE: Remember that this is not meant to be interpreted as a complete list of all possible complications.  Unforeseen problems may occur.  BLOOD THINNERS The following drugs  contain aspirin or other products, which can cause increased bleeding during surgery and should not be taken for 2 weeks prior to and 1 week after surgery.  If you should need take something for relief of minor pain, you may take acetaminophen which is found in Tylenol,m Datril, Anacin-3 and Panadol. It is not blood thinner. The products listed below are.  Do not take any of the products listed below in addition to any listed on your instruction sheet.  A.P.C or A.P.C with Codeine Codeine Phosphate Capsules #3 Ibuprofen Ridaura  ABC compound Congesprin Imuran rimadil  Advil Cope Indocin Robaxisal  Alka-Seltzer Effervescent Pain Reliever and Antacid Coricidin or Coricidin-D  Indomethacin Rufen    Alka-Seltzer plus Cold Medicine Cosprin Ketoprofen S-A-C Tablets  Anacin Analgesic Tablets or Capsules Coumadin Korlgesic Salflex  Anacin Extra Strength Analgesic tablets or capsules CP-2 Tablets Lanoril Salicylate  Anaprox Cuprimine Capsules Levenox Salocol  Anexsia-D Dalteparin Magan Salsalate  Anodynos Darvon compound Magnesium Salicylate Sine-off  Ansaid Dasin Capsules Magsal Sodium Salicylate  Anturane Depen Capsules Marnal Soma  APF Arthritis pain formula Dewitt's Pills Measurin Stanback  Argesic Dia-Gesic Meclofenamic Sulfinpyrazone  Arthritis Bayer Timed Release Aspirin Diclofenac Meclomen Sulindac  Arthritis pain formula Anacin Dicumarol Medipren Supac  Analgesic (Safety coated) Arthralgen Diffunasal Mefanamic Suprofen  Arthritis Strength Bufferin Dihydrocodeine Mepro Compound Suprol  Arthropan liquid Dopirydamole Methcarbomol with Aspirin Synalgos  ASA tablets/Enseals Disalcid Micrainin Tagament  Ascriptin Doan's Midol Talwin  Ascriptin A/D Dolene Mobidin Tanderil  Ascriptin Extra Strength Dolobid Moblgesic Ticlid  Ascriptin with Codeine Doloprin or Doloprin with Codeine Momentum Tolectin  Asperbuf Duoprin Mono-gesic Trendar  Aspergum Duradyne Motrin or Motrin IB Triminicin  Aspirin  plain, buffered or enteric coated Durasal Myochrisine Trigesic  Aspirin Suppositories Easprin Nalfon Trillsate  Aspirin with Codeine Ecotrin Regular or Extra Strength Naprosyn Uracel  Atromid-S Efficin Naproxen Ursinus  Auranofin Capsules Elmiron Neocylate Vanquish  Axotal Emagrin Norgesic Verin  Azathioprine Empirin or Empirin with Codeine Normiflo Vitamin E  Azolid Emprazil Nuprin Voltaren  Bayer Aspirin plain, buffered or children's or timed BC Tablets or powders Encaprin Orgaran Warfarin Sodium  Buff-a-Comp Enoxaparin Orudis Zorpin  Buff-a-Comp with Codeine Equegesic Os-Cal-Gesic   Buffaprin Excedrin plain, buffered or Extra Strength Oxalid   Bufferin Arthritis Strength Feldene Oxphenbutazone   Bufferin plain or Extra Strength Feldene Capsules Oxycodone with Aspirin   Bufferin with Codeine Fenoprofen Fenoprofen Pabalate or Pabalate-SF   Buffets II Flogesic Panagesic   Buffinol plain or Extra Strength Florinal or Florinal with Codeine Panwarfarin   Buf-Tabs Flurbiprofen Penicillamine   Butalbital Compound Four-way cold tablets Penicillin   Butazolidin Fragmin Pepto-Bismol   Carbenicillin Geminisyn Percodan   Carna Arthritis Reliever Geopen Persantine   Carprofen Gold's salt Persistin   Chloramphenicol Goody's Phenylbutazone   Chloromycetin Haltrain Piroxlcam   Clmetidine heparin Plaquenil   Cllnoril Hyco-pap Ponstel   Clofibrate Hydroxy chloroquine Propoxyphen         Before stopping any of these medications, be sure to consult the physician who ordered them.  Some, such as Coumadin (Warfarin) are ordered to prevent or treat serious conditions such as "deep thrombosis", "pumonary embolisms", and other heart problems.  The amount of time that you may need off of the medication may also vary with the medication and the reason for which you were taking it.  If you are taking any of these medications, please make sure you notify your pain physician before you undergo any  procedures.         Facet Blocks Patient Information  Description: The facets are joints in the spine between the vertebrae.  Like any joints in the body, facets can become irritated and painful.  Arthritis can also effect the facets.  By injecting steroids and local anesthetic in and around these joints, we can temporarily block the nerve supply to them.  Steroids act directly on irritated nerves and tissues to reduce selling and inflammation which often leads to decreased pain.  Facet blocks may be done anywhere along the spine from the neck to the low back depending upon the location of your pain.   After numbing the skin with local anesthetic (like Novocaine), a small needle is passed onto the facet joints under x-ray guidance.    You may experience a sensation of pressure while this is being done.  The entire block usually lasts about 15-25 minutes.   Conditions which may be treated by facet blocks:   Low back/buttock pain  Neck/shoulder pain  Certain types of headaches  Preparation for the injection:  1. Do not eat any solid food or dairy products within 8 hours of your appointment. 2. You may drink clear liquid up to 3 hours before appointment.  Clear liquids include water, black coffee, juice or soda.  No milk or cream please. 3. You may take your regular medication, including pain medications, with a sip of water before your appointment.  Diabetics should hold regular insulin (if taken separately) and take 1/2 normal NPH dose the morning of the procedure.  Carry some sugar containing items with you to your appointment. 4. A driver must accompany you and be prepared to drive you home after your procedure. 5. Bring all your current medications with you. 6. An IV may be inserted and sedation may be given at the discretion of the physician. 7. A blood pressure cuff, EKG and other monitors will often be applied during the procedure.  Some patients may need to have extra oxygen  administered for a short period. 8. You will be asked to provide medical information, including your allergies and medications, prior to the procedure.  We must know immediately if you are taking blood thinners (like Coumadin/Warfarin) or if you are allergic to IV iodine contrast (dye).  We must know if you could possible be pregnant.  Possible side-effects:   Bleeding from needle site  Infection (rare, may require surgery)  Nerve injury (rare)  Numbness & tingling (temporary)  Difficulty urinating (rare, temporary)  Spinal headache (a headache worse with upright posture)  Light-headedness (temporary)  Pain at injection site (serveral days)  Decreased blood pressure (rare, temporary)  Weakness in arm/leg (temporary)  Pressure sensation in back/neck (temporary)   Call if you experience:   Fever/chills associated with headache or increased back/neck pain  Headache worsened by an upright position  New onset, weakness or numbness of an extremity below the injection site  Hives or difficulty breathing (go to the emergency room)  Inflammation or drainage at the injection site(s)  Severe back/neck pain greater than usual  New symptoms which are concerning to you  Please note:  Although the local anesthetic injected can often make your back or neck feel good for several hours after the injection, the pain will likely return. It takes 3-7 days for steroids to work.  You may not notice any pain relief for at least one week.  If effective, we will often do a series of 2-3 injections spaced 3-6 weeks apart to maximally decrease your pain.  After the initial series, you may be a candidate for a more permanent nerve block of the facets.  If you have any questions, please call #[phone] [facility]  General Discharge Instructions :  If you need to reach your doctor call: Monday-Friday 8:00 am - 4:00  pm at [phone] or toll free [phone].  After clinic hours [phone] to have operator reach doctor.  Bring all of your medication bottles to all your appointments in the pain clinic.  To cancel or reschedule your appointment with Pain Management please remember to call 24 hours in advance to avoid a fee.  Refer to the educational materials which you have been given on: General Risks, I had my  Procedure. Discharge Instructions, Post Sedation.  Post Procedure Instructions:  The drugs you were given will stay in your system until tomorrow, so for the next 24 hours you should not drive, make any legal decisions or drink any alcoholic beverages.  You may eat anything you prefer, but it is better to start with liquids then soups and crackers, and gradually work up to solid foods.  Please notify your doctor immediately if you have any unusual bleeding, trouble breathing or pain that is not related to your normal pain.  Depending on the type of procedure that was done, some parts of your body may feel week and/or numb.  This usually clears up by tonight or the next day.  Walk with the use of an assistive device or accompanied by an adult for the 24 hours.  You may use ice on the affected area for the first 24 hours.  Put ice in a Ziploc bag and cover with a towel and place against area 15 minutes on 15 minutes off.  You may switch to heat after 24 hours.

## 2016-07-18 NOTE — Progress Notes (Signed)
Safety precautions to be maintained throughout the outpatient stay will include: orient to surroundings, keep bed in low position, maintain call bell within reach at all times, provide assistance with transfer out of bed and ambulation.  

## 2016-07-18 NOTE — Progress Notes (Signed)
Subjective:  Patient ID: James Hood, male    DOB: [birth year]  Age: 76 y.o. MRN: [id number]  CC: Hip Pain (left)   Service Provided on Last Visit: Procedure  PROCEDURE:Caudal epidural steroid No. 2 under fluoroscopic guidance with moderate sedation  HPI HARINDER LOPEMAN presents for reevaluation. He was last seen approximately a month ago and had his first caudal epidural steroid. He has a difficult situation with his left leg pain that effects the left posterior lateral calf and left lower back. The caudal injection gave him approximately 80% improvement that lasted no more than 1 week and he feels that he is back to baseline. He takes Vicodin 1-2 tablets per day at the 5 mg strength and these do provide benefit for a pain that he describes as a maximum VAS score of 10. The pain that he is experiencing is described as horrible spasming and impaired sleep at night and he is requesting a stronger medication for pain relief. 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 He denies diverting or illicit use of his medications and we have reviewed the [facility] practitioner database information and it is appropriate. Furthermore he reports that he's had facet injections and a pain clinic in [place] following his recent back surgery these also gave 80% improvement lasting a week and he is questioning whether he may be a candidate for a radiofrequency ablation as this was discussed with him by his previous pain management doctor. He presents today with his daughter who controls his medications.   By history  He is a pleasant 76 year old white male with a long-standing history of low back pain for over one year. He reports that he had a laminectomy in October of this year with Dr. Joya Salm. He also states that he has continued to have lancinating left posterior and lateral leg pain described as sharp nagging pulsating and  throbbing it's present throughout much of the day. He rates this as a VAS of 7 best a 2 and now a 5. He feels that it is gotten worse. He has some chronic intermittent weakness of the left leg. He had an MRI that was reviewed with him today that is in follow-up to his October surgery. He also reports having had 3 epidural steroids prior to his surgery and 1 L5-S1 epidural after this. The pain is worse with bending climbing motion squatting and better with rest sleep. 5 mg of oxycodone does not give him much relief. We have reviewed his physician database and there are no concerns. He also takes Neurontin 1200 mg 3 times a day.  History Daylynn has a past medical history of Anxiety; Anxiety and depression; Arthritis; Colon polyps; Dementia; Depression; Diabetes mellitus type 2 in nonobese [facility]); GERD (gastroesophageal reflux disease); Headache; 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[place]); History of hiatal hernia; Hypercholesteremia; Hypertension; Hyperthyroidism; Neuropathic pain; Primary localized osteoarthritis of right knee; Stomach ulcer; and Tremor, essential.   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   His family history includes Depression in his father; Heart attack in his mother; Heart disease in his mother; Hypertension in his father.He reports that he has quit smoking. His smoking use included Cigarettes. He has a 60.00 pack-year smoking history. He has never used smokeless tobacco. He reports that he does not drink alcohol or use drugs.  No  results found for this or any previous visit.  [employer] Select 13  Date Value Ref Range Status  05/03/2016 FINAL  Final    Comment:    ==================================================================== TOX[employer]SU[employer] SELECT 13  (MW) ==================================================================== Test                             Result       Flag       Units Drug Present and Declared for Prescription Verification   Oxycodone                      140          EXPECTED   ng/mg creat   Oxymorphone                    127          EXPECTED   ng/mg creat   Noroxycodone                   903          EXPECTED   ng/mg creat   Noroxymorphone                 71           EXPECTED   ng/mg creat    Sources of oxycodone are scheduled prescription medications.    Oxymorphone, noroxycodone, and noroxymorphone are expected    metabolites of oxycodone. Oxymorphone is also available as a    scheduled prescription medication.   Phenobarbital                  PRESENT      EXPECTED    Phenobarbital is an expected metabolite of primidone;    Phenobarbital may also be administered as a prescription drug. Drug Absent but Declared for Prescription Verification   Desmethyldiazepam              Not Detected UNEXPECTED ng/mg creat    Desmethyldiazepam is an expected metabolite of chlordiazepoxide,    clorazepate, halazepam, and prazepam.   Hydrocodone                    Not Detected UNEXPECTED ng/mg creat   Tramadol                       Not Detected UNEXPECTED ==================================================================== Test                      Result    Flag   Units      Ref Range   Creatinine              75               mg/dL      >=20 ==================================================================== Declared Medications:  The flagging and interpretation on this report are based on the  following declared medications.  Unexpected results may arise from  inaccuracies in the declared medications.  **Note: The testing scope of this panel includes these medications:  Clorazepate (Tranxene)  Hydrocodone (Norco)  Oxycodone  Oxycodone (Percocet)  Primidone (Mysoline)  Tramadol (Ultram)  **Note: The testing scope of  this panel does not include following  reported medications:  Acetaminophen (Norco)  Acetaminophen (Percocet)  Acetaminophen (Tylenol)  Aspirin  Atropine (Lomotil)  Bupropion (Zyban)  Colestipol  Cyclobenzaprine (Flexeril)  Diphenoxylate (Lomotil)  Divalproex (Depakote)  Donepezil (Aricept)  Fenofibrate (Tricor)  Finasteride (Proscar)  Gabapentin (Neurontin)  Glimepiride (Amaryl)  Hydrochlorothiazide (Prinzide)  Hydrochlorothiazide (Zestoretic)  Lisinopril (Prinzide)  Lisinopril (Zestoretic)  Memantine (Namenda)  Metformin (Glucophage)  Metoprolol (Lopressor)  Metoprolol (Toprol)  Omeprazole (Prilosec)  Prednisone (Sterapred)  Propranolol (Inderal)  Quetiapine (Seroquel)  Simvastatin (Zocor)  Tamsulosin (Flomax) ==================================================================== For clinical consultation, please call [phone]. ====================================================================     Outpatient Medications Prior to Visit  Medication Sig Dispense Refill  . acetaminophen (TYLENOL) 500 MG tablet Take 1,000 mg by mouth as needed.     Marland Kitchen aspirin EC 81 MG tablet Take 81 mg by mouth every morning.     Marland Kitchen buPROPion (ZYBAN) 150 MG 12 hr tablet Take 1 tablet (150 mg total) by mouth 2 (two) times daily. 180 tablet 1  . clorazepate (TRANXENE) 3.75 MG tablet Take 1 tablet (3.75 mg total) by mouth at bedtime as needed for anxiety. 30 tablet 0  . colestipol (COLESTID) 1 g tablet Take 2 g by mouth daily.     . diphenoxylate-atropine (LOMOTIL) 2.5-0.025 MG tablet Take 1-2 tablets by mouth 2 (two) times daily. Take 2 tablets in the morning, and 1 tablet in the evening    . divalproex (DEPAKOTE ER) 250 MG 24 hr tablet Take 250 mg by mouth 2 (two) times daily. Pt takes 250mg  in the morning, and 250mg  in the afternoon    . donepezil (ARICEPT) 10 MG tablet Take 10 mg by mouth at bedtime.    . fenofibrate (TRICOR) 145 MG tablet Take 1 tablet (145 mg total) by mouth daily.  90 tablet 1  . finasteride (PROSCAR) 5 MG tablet Take 1 tablet (5 mg total) by mouth daily. 30 tablet 11  . gabapentin (NEURONTIN) 600 MG tablet Take 2 tablets (1,200 mg total) by mouth 3 (three) times daily. 180 tablet 3  . glimepiride (AMARYL) 4 MG tablet Take 1 tablet (4 mg total) by mouth 2 (two) times daily. 180 tablet 3  . lisinopril-hydrochlorothiazide (PRINZIDE,ZESTORETIC) 20-25 MG tablet Take 1 tablet by mouth 2 (two) times daily. 180 tablet 2  . memantine (NAMENDA) 10 MG tablet Take 10 mg by mouth 2 (two) times daily.     . metFORMIN (GLUCOPHAGE) 500 MG tablet Take 2 tablets (1,000 mg total) by mouth 2 (two) times daily with a meal. 180 tablet 3  . metoprolol succinate (TOPROL-XL) 25 MG 24 hr tablet Take 25 mg by mouth daily.     . metoprolol tartrate (LOPRESSOR) 25 MG tablet Take 25 mg by mouth daily.     . nicotine (NICODERM CQ - DOSED IN MG/24 HOURS) 21 mg/24hr patch Place 1 patch (21 mg total) onto the skin daily. 42 patch 0  . omeprazole (PRILOSEC) 20 MG capsule 20 mg 2 (two) times daily before a meal.     . oxyCODONE (OXY IR/ROXICODONE) 5 MG immediate release tablet Take 1 tablet (5 mg total) by mouth every 6 (six) hours as needed for severe pain. 45 tablet 0  . primidone (MYSOLINE) 50 MG tablet TAKE 5 TABLETS IN THE MORNING AND 3 TABLETS IN THE EVENING    . propranolol (INDERAL) 60 MG tablet Take 60 mg by mouth 2 (two) times daily.     . QUEtiapine (SEROQUEL) 25 MG tablet Take 75 mg by mouth at bedtime.     . simvastatin (ZOCOR) 40 MG tablet Take 1 tablet (40 mg total) by mouth daily. 90 tablet 1  . sitaGLIPtin (JANUVIA) 100 MG tablet Take 1 tablet (100 mg  total) by mouth daily. 30 tablet 3  . tamsulosin (FLOMAX) 0.4 MG CAPS capsule Take 0.4 mg by mouth daily after supper.     . cyclobenzaprine (FLEXERIL) 10 MG tablet Take 10 mg by mouth 3 (three) times daily as needed for muscle spasms.     Facility-Administered Medications Prior to Visit  Medication Dose Route Frequency  Provider Last Rate Last Dose  . iopamidol (ISOVUE-M) 41 % intrathecal injection 20 mL  20 mL Other Once PRN Molli Barrows, MD      . lactated ringers infusion 1,000 mL  1,000 mL Intravenous Continuous Molli Barrows, MD 125 mL/hr at 06/14/16 1355 1,000 mL at 06/14/16 1355   Lab Results  Component Value Date   WBC 7.8 07/13/2016   HGB 11.6 (L) 07/13/2016   HCT 34.0 (L) 07/13/2016   PLT 354.0 07/13/2016   GLUCOSE 117 (H) 06/19/2016   CHOL 161 09/16/2015   TRIG 393.0 (H) 09/16/2015   HDL 38.50 (L) 09/16/2015   LDLDIRECT 66.0 09/16/2015   ALT 21 06/19/2016   AST 25 06/19/2016   NA 137 06/19/2016   K 4.5 06/19/2016   CL 100 06/19/2016   CREATININE 1.15 06/19/2016   BUN 46 (H) 06/19/2016   CO2 30 06/19/2016   TSH 1.01 06/19/2016   PSA 0.07 (L) 09/16/2015   INR 1.08 11/05/2014   HGBA1C 7.7 (H) 06/13/2016    --------------------------------------------------------------------------------------------------------------------- Mr Lumbar Spine Wo Contrast  Result Date: 03/29/2016 CLINICAL DATA:  Initial evaluation for chronic low back pain with left-sided sciatica, worsened. EXAM: MRI LUMBAR SPINE WITHOUT CONTRAST TECHNIQUE: Multiplanar, multisequence MR imaging of the lumbar spine was performed. No intravenous contrast was administered. COMPARISON:  Prior MRI from 09/22/2015. FINDINGS: Segmentation: L normal segmentation. Lowest well-formed disc is labeled the L5-S1 level. Same numbering system is employed as on previous exams. Alignment: Straightening with slight reversal of the normal lumbar lordosis, stable. No listhesis. Vertebrae: Vertebral body heights are maintained. No evidence for acute or chronic fracture. Reactive endplate changes about the left aspect of the L5-S1 interspace, similar to previous. Postoperative changes from recent decompressive left hemi laminectomy with micro discectomy seen at L5-S1. No concerning features identified. Conus medullaris: Extends to the L1 level and  appears normal. Paraspinal and other soft tissues: Normal expected postoperative no concerning features identified. Paraspinous soft tissues otherwise within normal limits. Visualized visceral structures are normal. No retroperitoneal adenopathy. Changes related to decompressive laminectomy present within the posterior paraspinous soft tissues of the lower back. Disc levels: L1-2: Degenerative disc desiccation with intervertebral disc space narrowing and mild disc bulge. No stenosis. L2-3: Degenerative disc desiccation with intervertebral disc space narrowing and mild disc bulge. No stenosis. L3-4: Degenerative disc desiccation with mild diffuse disc bulge. Mild facet hypertrophy. Mild right foraminal stenosis related to disc bulge and facet disease, stable. No canal or left foraminal narrowing. L4-5: Degenerative disc bulge with intervertebral disc space narrowing and disc desiccation. Disc bulging a centric to the right without focal disc protrusion. Superimposed bilateral facet arthrosis with ligamentum flavum hypertrophy. Reactive effusions within the bilateral L4-5 facets, stable. Minimal bilateral subarticular stenosis, slightly greater on the left. Mild bilateral foraminal narrowing related to disc bulge and facet disease, stable. L5-S1: Postoperative changes from interval decompressive left hemi laminectomy with micro discectomy. Previously seen left subarticular disc protrusion has been largely resected, although there appears to be a small residual central disc protrusion that minimally indents the ventral thecal sac (series 6, image 28). Soft tissue density within the left lateral  epidural space likely reflects postoperative granulation tissue, although evaluation somewhat limited due to lack of IV contrast. This surrounds the transiting S1 nerve root in the left lateral recess (series 7, image 28). No significant canal stenosis. Superimposed bilateral facet arthrosis with reactive effusions in the  bilateral L5-S1 facets is similar to previous. Mild left foraminal narrowing is relatively unchanged. No significant right foraminal stenosis. IMPRESSION: 1. Postoperative changes from recent decompressive left hemi laminectomy with microdiskectomy at L5-S1. Postoperative granulation tissue within the left epidural space, surrounding the descending left S1 nerve root. No residual stenosis or concerning features identified. 2. 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 No other significant stenosis or evidence for impingement. Electronically Signed   By: Jeannine Boga M.D.   On: 03/29/2016 13:10       ---------------------------------------------------------------------------------------------------------------------- Past Medical History:  Diagnosis Date  . Anxiety   . Anxiety and depression   . Arthritis   . Colon polyps   . Dementia   . Depression   . Diabetes mellitus type 2 in nonobese (HCC)   . GERD (gastroesophageal reflux disease)   . Headache   . 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[place])   . History of hiatal hernia   . Hypercholesteremia   . Hypertension   . Hyperthyroidism   . Neuropathic pain   . Primary localized osteoarthritis of right knee   . Stomach ulcer   . Tremor, essential     Past Surgical History:  Procedure Laterality Date  . esophageal stretch    . JOINT REPLACEMENT    . LUMBAR LAMINECTOMY/DECOMPRESSION MICRODISCECTOMY Left 02/03/2016   Procedure: LEFT L5-S1 DISKECTOMY;  Surgeon: Leeroy Cha, MD;  Location: [facility];  Service: Neurosurgery;  Laterality: Left;  LEFT L5-S1 DISKECTOMY  . TONSILLECTOMY    . TOTAL KNEE ARTHROPLASTY Right 11/15/2014   Procedure: TOTAL KNEE ARTHROPLASTY;  Surgeon: Elsie Saas, MD;  Location: [facility];  Service: Orthopedics;  Laterality: Right;    Family History  Problem Relation Age of Onset  . Heart attack Mother   . Heart disease Mother   . Hypertension Father   .  Depression Father   . Kidney cancer Neg Hx   . Prostate cancer Neg Hx     Social History  Substance Use Topics  . Smoking status: Former Smoker    Packs/day: 1.00    Years: 60.00    Types: Cigarettes  . Smokeless tobacco: Never Used  . 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 Marital status: Married    Spouse name: N/A  . Number of children: N/A  . Years of education: N/A   Social History Main Topics  . Smoking status: Former Smoker    Packs/day: 1.00    Years: 60.00    Types: Cigarettes  . Smokeless tobacco: Never Used  . 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 Drug use: No  . Sexual activity: Not Currently   Other Topics Concern  . None   Social History Narrative  . None    Scheduled Meds: Continuous Infusions: PRN Meds:.   BP 132/79 (BP Location: Right Arm, Patient Position: Sitting, Cuff Size: Normal)   Pulse 76   Temp 97.6 F (36.4 C) (Oral)   Resp 16   Ht 5\' 10"  (1.778 m)   Wt 200 lb (90.7 kg)  SpO2 98%   BMI 28.70 kg/m    BP Readings from Last 3 Encounters:  07/18/16 132/79  06/19/16 118/72  06/14/16 115/74     Wt Readings from Last 3 Encounters:  07/18/16 200 lb (90.7 kg)  06/19/16 195 lb 3.2 oz (88.5 kg)  06/14/16 194 lb (88 kg)     ----------------------------------------------------------------------------------------------------------------------  ROS Review of Systems  Cardiac: No angina or shortness of breath GI: No constipation  Objective:  BP 132/79 (BP Location: Right Arm, Patient Position: Sitting, Cuff Size: Normal)   Pulse 76   Temp 97.6 F (36.4 C) (Oral)   Resp 16   Ht 5\' 10"  (1.778 m)   Wt 200 lb (90.7 kg)   SpO2 98%   BMI 28.70 kg/m   Physical Exam Patient is alert oriented cooperative compliant Lungs  are clear to auscultation Some mild paraspinous muscle tenderness in the lumbar region otherwise no change . He has some mild paraspinous muscle tenderness on the left side but no overt trigger points. He does have pain with extension at the low back and left lateral rotation.     Assessment & Plan:   Jasyah was seen today for hip pain.  Diagnoses and all orders for this visit:  Sciatica of left side associated with disorder of lumbar spine -     triamcinolone acetonide (KENALOG-40) injection 40 mg; 1 mL (40 mg total) by Other route once. -     sodium chloride flush (NS) 0.9 % injection 10 mL; 10 mLs by Other route once. -     iopamidol (ISOVUE-M) 41 % intrathecal injection 20 mL; 20 mLs by Other route once as needed for contrast. -     lactated ringers infusion 1,000 mL; Inject 1,000 mLs into the vein continuous. -     lidocaine (PF) (XYLOCAINE) 1 % injection 5 mL; Inject 5 mLs into the skin once. -     midazolam (VERSED) 5 MG/5ML injection 5 mg; Inject 5 mLs (5 mg total) into the vein once. -     ropivacaine (PF) 5 mg/mL (0.5%) (NAROPIN) injection 10 mL; 10 mLs by Epidural route once. -     ropivacaine (PF) 5 mg/mL (0.5%) (NAROPIN) injection 0.5 mL; 0.5 mLs by Epidural route once.  Chronic left-sided low back pain with left-sided sciatica -     ropivacaine (PF) 5 mg/mL (0.5%) (NAROPIN) injection 0.5 mL; 0.5 mLs by Epidural route once.  Facet arthritis of lumbar region ([place]) -     LUMBAR FACET(MEDIAL BRANCH NERVE BLOCK) MBNB; Future -     ropivacaine (PF) 5 mg/mL (0.5%) (NAROPIN) injection 0.5 mL; 0.5 mLs by Epidural route once.  Other orders -     oxyCODONE-acetaminophen (PERCOCET) 7.5-325 MG tablet; Take 1 tablet by mouth 3 (three) times daily.     ----------------------------------------------------------------------------------------------------------------------  Problem List Items Addressed This Visit      Other   Chronic lumbar pain   Relevant Medications    triamcinolone acetonide (KENALOG-40) injection 40 mg   oxyCODONE-acetaminophen (PERCOCET) 7.5-325 MG tablet   ropivacaine (PF) 5 mg/mL (0.5%) (NAROPIN) injection 0.5 mL    Other Visit Diagnoses    Sciatica of left side associated with disorder of lumbar spine    -  Primary   Relevant Medications   triamcinolone acetonide (KENALOG-40) injection 40 mg   sodium chloride flush (NS) 0.9 % injection 10 mL   iopamidol (ISOVUE-M) 41 % intrathecal injection 20 mL   lactated ringers infusion 1,000 mL   lidocaine (PF) (XYLOCAINE)  1 % injection 5 mL   midazolam (VERSED) 5 MG/5ML injection 5 mg   ropivacaine (PF) 5 mg/mL (0.5%) (NAROPIN) injection 10 mL   ropivacaine (PF) 5 mg/mL (0.5%) (NAROPIN) injection 0.5 mL   Facet arthritis of lumbar region (HCC)       Relevant Medications   triamcinolone acetonide (KENALOG-40) injection 40 mg   oxyCODONE-acetaminophen (PERCOCET) 7.5-325 MG tablet   ropivacaine (PF) 5 mg/mL (0.5%) (NAROPIN) injection 0.5 mL   Other Relevant Orders   LUMBAR FACET(MEDIAL BRANCH NERVE BLOCK) MBNB      ----------------------------------------------------------------------------------------------------------------------  1. Sciatica of left side associated with disorder of lumbar spine We will proceed with a Second caudal epidural today. The risks and benefits have been reviewed all questions answered and no guarantees made. We'll have her return to clinic in 1 month for repeat evaluation and a possible facet injection for his left side lower back pain L3-S1. 2. Chronic left-sided low back pain with left-sided sciatica Secondary to the severity of the pain as discussed with him today I'm going to increase his hydrocodone to 7.5 mg strength to be taken up to 3 times per day. Hopefully we will be able to achieve some relief with injection therapy because he is describing a pain that is intolerable and this is corroborated by his daughter. He is considered to be a nonsurgical  candidate for this current situation. We have also discussed the risks and benefits of opioid management especially considering his past distant history of alcohol abuse ----------------------------------------------------------------------------------------------------------------------  I am having Mr. Stophel start on oxyCODONE-acetaminophen. I am also having him maintain his divalproex, metoprolol tartrate, cyclobenzaprine, donepezil, QUEtiapine, colestipol, aspirin EC, diphenoxylate-atropine, tamsulosin, propranolol, finasteride, clorazepate, acetaminophen, metFORMIN, fenofibrate, simvastatin, metoprolol succinate, omeprazole, memantine, primidone, gabapentin, nicotine, lisinopril-hydrochlorothiazide, buPROPion, sitaGLIPtin, oxyCODONE, and glimepiride. We administered lactated ringers. We will continue to administer lactated ringers and iopamidol.   Meds ordered this encounter  Medications  . triamcinolone acetonide (KENALOG-40) injection 40 mg  . sodium chloride flush (NS) 0.9 % injection 10 mL  . iopamidol (ISOVUE-M) 41 % intrathecal injection 20 mL  . lactated ringers infusion 1,000 mL  . lidocaine (PF) (XYLOCAINE) 1 % injection 5 mL  . midazolam (VERSED) 5 MG/5ML injection 5 mg  . ropivacaine (PF) 5 mg/mL (0.5%) (NAROPIN) injection 10 mL  . oxyCODONE-acetaminophen (PERCOCET) 7.5-325 MG tablet    Sig: Take 1 tablet by mouth 3 (three) times daily.    Dispense:  90 tablet    Refill:  0  . ropivacaine (PF) 5 mg/mL (0.5%) (NAROPIN) injection 0.5 mL    Procedure: Caudal epidural steroid No. 2 under fluoroscopic guidance with moderate sedationAfter informed consent was obtained and the risks benefits reviewed patient chose to pursue a caudal epidural steroid injection today. With the patient in the prone position and an IV in place, sedation was with IV versed. Using AP and lateral fluoroscopic guidance I identified the area overlying the sacral hiatus. This area was broadly prepped with  DuraPrep 3 and we utilized strict aseptic technique during the procedure. 1% lidocaine was infiltrated 2 cc using a 25-gauge needle overlying the sacral cornu and then using lateral fluoroscopic guidance I advanced an 18-gauge Touhy needle approximately 2 cm through the sacral hiatus. Confirmation was with 2 cc of Isovue yielding good epidural spread and no evidence of IV or subarachnoid uptake. This was followed by an injection of 5 cc of saline mixed with 1 cc of ropivacaine 0.2% and 60 mg of triamcinolone. This was tolerated without difficulty  the patient was convalesced and discharged home in stable condition for follow-up as mentioned. JA     Follow-up: Return for evaluation, procedure.    Molli Barrows, MD @DATE @  The [place] practitioner database for opioid medications on this patient has been reviewed by me and my staff   Greater than 50% of the total encounter time was spent in counseling and / or coordination of care.     This dictation was performed utilizing Systems analyst.  Please excuse any unintentional or mistaken typographical errors as a result.

## 2016-07-19 ENCOUNTER — Telehealth: Payer: Self-pay | Admitting: *Deleted

## 2016-07-19 NOTE — Telephone Encounter (Signed)
Denies problems post procedure. 

## 2016-07-24 DIAGNOSIS — K449 Diaphragmatic hernia without obstruction or gangrene: Secondary | ICD-10-CM | POA: Diagnosis not present

## 2016-07-24 DIAGNOSIS — Z09 Encounter for follow-up examination after completed treatment for conditions other than malignant neoplasm: Secondary | ICD-10-CM | POA: Diagnosis not present

## 2016-07-24 DIAGNOSIS — Z8719 Personal history of other diseases of the digestive system: Secondary | ICD-10-CM | POA: Diagnosis not present

## 2016-07-24 DIAGNOSIS — K2270 Barrett's esophagus without dysplasia: Secondary | ICD-10-CM | POA: Diagnosis not present

## 2016-07-24 DIAGNOSIS — K22719 Barrett's esophagus with dysplasia, unspecified: Secondary | ICD-10-CM | POA: Diagnosis not present

## 2016-07-24 DIAGNOSIS — K2930 Chronic superficial gastritis without bleeding: Secondary | ICD-10-CM | POA: Diagnosis not present

## 2016-07-31 ENCOUNTER — Ambulatory Visit: Payer: PPO

## 2016-08-04 ENCOUNTER — Other Ambulatory Visit: Payer: Self-pay | Admitting: Family Medicine

## 2016-08-06 ENCOUNTER — Ambulatory Visit (HOSPITAL_BASED_OUTPATIENT_CLINIC_OR_DEPARTMENT_OTHER): Payer: PPO | Admitting: Anesthesiology

## 2016-08-06 ENCOUNTER — Encounter: Payer: Self-pay | Admitting: Anesthesiology

## 2016-08-06 VITALS — BP 122/46 | HR 80 | Temp 98.1°F | Resp 16 | Ht 71.0 in | Wt 200.0 lb

## 2016-08-06 DIAGNOSIS — M5442 Lumbago with sciatica, left side: Secondary | ICD-10-CM | POA: Insufficient documentation

## 2016-08-06 DIAGNOSIS — M4696 Unspecified inflammatory spondylopathy, lumbar region: Secondary | ICD-10-CM | POA: Insufficient documentation

## 2016-08-06 DIAGNOSIS — Z87891 Personal history of nicotine dependence: Secondary | ICD-10-CM | POA: Insufficient documentation

## 2016-08-06 DIAGNOSIS — Z96651 Presence of right artificial knee joint: Secondary | ICD-10-CM

## 2016-08-06 DIAGNOSIS — Z8719 Personal history of other diseases of the digestive system: Secondary | ICD-10-CM

## 2016-08-06 DIAGNOSIS — Z86010 Personal history of colonic polyps: Secondary | ICD-10-CM | POA: Insufficient documentation

## 2016-08-06 DIAGNOSIS — Z7982 Long term (current) use of aspirin: Secondary | ICD-10-CM | POA: Insufficient documentation

## 2016-08-06 DIAGNOSIS — G250 Essential tremor: Secondary | ICD-10-CM

## 2016-08-06 DIAGNOSIS — Z7984 Long term (current) use of oral hypoglycemic drugs: Secondary | ICD-10-CM | POA: Insufficient documentation

## 2016-08-06 DIAGNOSIS — E0590 Thyrotoxicosis, unspecified without thyrotoxic crisis or storm: Secondary | ICD-10-CM | POA: Insufficient documentation

## 2016-08-06 DIAGNOSIS — E7800 Pure hypercholesterolemia, unspecified: Secondary | ICD-10-CM | POA: Insufficient documentation

## 2016-08-06 DIAGNOSIS — K219 Gastro-esophageal reflux disease without esophagitis: Secondary | ICD-10-CM

## 2016-08-06 DIAGNOSIS — M25552 Pain in left hip: Secondary | ICD-10-CM | POA: Insufficient documentation

## 2016-08-06 DIAGNOSIS — F0390 Unspecified dementia without behavioral disturbance: Secondary | ICD-10-CM

## 2016-08-06 DIAGNOSIS — M47816 Spondylosis without myelopathy or radiculopathy, lumbar region: Secondary | ICD-10-CM

## 2016-08-06 DIAGNOSIS — M539 Dorsopathy, unspecified: Secondary | ICD-10-CM | POA: Diagnosis not present

## 2016-08-06 DIAGNOSIS — Z8249 Family history of ischemic heart disease and other diseases of the circulatory system: Secondary | ICD-10-CM | POA: Insufficient documentation

## 2016-08-06 DIAGNOSIS — M1711 Unilateral primary osteoarthritis, right knee: Secondary | ICD-10-CM | POA: Insufficient documentation

## 2016-08-06 DIAGNOSIS — Z79891 Long term (current) use of opiate analgesic: Secondary | ICD-10-CM | POA: Insufficient documentation

## 2016-08-06 DIAGNOSIS — G8929 Other chronic pain: Secondary | ICD-10-CM | POA: Insufficient documentation

## 2016-08-06 DIAGNOSIS — M5386 Other specified dorsopathies, lumbar region: Secondary | ICD-10-CM

## 2016-08-06 DIAGNOSIS — E119 Type 2 diabetes mellitus without complications: Secondary | ICD-10-CM | POA: Insufficient documentation

## 2016-08-06 DIAGNOSIS — I10 Essential (primary) hypertension: Secondary | ICD-10-CM | POA: Insufficient documentation

## 2016-08-06 MED ORDER — OXYCODONE-ACETAMINOPHEN 7.5-325 MG PO TABS: 1.0000 | ORAL_TABLET | Freq: Three times a day (TID) | ORAL | 0 refills | Status: DC

## 2016-08-06 NOTE — Progress Notes (Signed)
Nursing Pain Medication Assessment:  Safety precautions to be maintained throughout the outpatient stay will include: orient to surroundings, keep bed in low position, maintain call bell within reach at all times, provide assistance with transfer out of bed and ambulation.  Medication Inspection Compliance: Pill count conducted under aseptic conditions, in front of the patient. Neither the pills nor the bottle was removed from the patient's sight at any time. Once count was completed pills were immediately returned to the patient in their original bottle.  Medication: Oxycodone/APAP Pill/Patch Count: 37 of 90 pills remain Bottle Appearance: Standard pharmacy container. Clearly labeled. Filled Date: 02/ / 28 / 2018 Last Medication intake:  Today

## 2016-08-06 NOTE — Patient Instructions (Signed)
GENERAL RISKS AND COMPLICATIONS  What are the risk, side effects and possible complications? Generally speaking, most procedures are safe.  However, with any procedure there are risks, side effects, and the possibility of complications.  The risks and complications are dependent upon the sites that are lesioned, or the type of nerve block to be performed.  The closer the procedure is to the spine, the more serious the risks are.  Great care is taken when placing the radio frequency needles, block needles or lesioning probes, but sometimes complications can occur. 1. Infection: Any time there is an injection through the skin, there is a risk of infection.  This is why sterile conditions are used for these blocks.  There are four possible types of infection. 1. Localized skin infection. 2. Central Nervous System Infection-This can be in the form of Meningitis, which can be deadly. 3. Epidural Infections-This can be in the form of an epidural abscess, which can cause pressure inside of the spine, causing compression of the spinal cord with subsequent paralysis. This would require an emergency surgery to decompress, and there are no guarantees that the patient would recover from the paralysis. 4. Discitis-This is an infection of the intervertebral discs.  It occurs in about 1% of discography procedures.  It is difficult to treat and it may lead to surgery.        2. Pain: the needles have to go through skin and soft tissues, will cause soreness.       3. Damage to internal structures:  The nerves to be lesioned may be near blood vessels or    other nerves which can be potentially damaged.       4. Bleeding: Bleeding is more common if the patient is taking blood thinners such as  aspirin, Coumadin, Ticiid, Plavix, etc., or if he/she have some genetic predisposition  such as hemophilia. Bleeding into the spinal canal can cause compression of the spinal  cord with subsequent paralysis.  This would require an  emergency surgery to  decompress and there are no guarantees that the patient would recover from the  paralysis.       5. Pneumothorax:  Puncturing of a lung is a possibility, every time a needle is introduced in  the area of the chest or upper back.  Pneumothorax refers to free air around the  collapsed lung(s), inside of the thoracic cavity (chest cavity).  Another two possible  complications related to a similar event would include: Hemothorax and Chylothorax.   These are variations of the Pneumothorax, where instead of air around the collapsed  lung(s), you may have blood or chyle, respectively.       6. Spinal headaches: They may occur with any procedures in the area of the spine.       7. Persistent CSF (Cerebro-Spinal Fluid) leakage: This is a rare problem, but may occur  with prolonged intrathecal or epidural catheters either due to the formation of a fistulous  track or a dural tear.       8. Nerve damage: By working so close to the spinal cord, there is always a possibility of  nerve damage, which could be as serious as a permanent spinal cord injury with  paralysis.       9. Death:  Although rare, severe deadly allergic reactions known as "Anaphylactic  reaction" can occur to any of the medications used.      10. Worsening of the symptoms:  We can always make thing worse.    What are the chances of something like this happening? Chances of any of this occuring are extremely low.  By statistics, you have more of a chance of getting killed in a motor vehicle accident: while driving to the hospital than any of the above occurring .  Nevertheless, you should be aware that they are possibilities.  In general, it is similar to taking a shower.  Everybody knows that you can slip, hit your head and get killed.  Does that mean that you should not shower again?  Nevertheless always keep in mind that statistics do not mean anything if you happen to be on the wrong side of them.  Even if a procedure has a 1  (one) in a 1,000,000 (million) chance of going wrong, it you happen to be that one..Also, keep in mind that by statistics, you have more of a chance of having something go wrong when taking medications.  Who should not have this procedure? If you are on a blood thinning medication (e.g. Coumadin, Plavix, see list of "Blood Thinners"), or if you have an active infection going on, you should not have the procedure.  If you are taking any blood thinners, please inform your physician.  How should I prepare for this procedure?  Do not eat or drink anything at least six hours prior to the procedure.  Bring a driver with you .  It cannot be a taxi.  Come accompanied by an adult that can drive you back, and that is strong enough to help you if your legs get weak or numb from the local anesthetic.  Take all of your medicines the morning of the procedure with just enough water to swallow them.  If you have diabetes, make sure that you are scheduled to have your procedure done first thing in the morning, whenever possible.  If you have diabetes, take only half of your insulin dose and notify our nurse that you have done so as soon as you arrive at the clinic.  If you are diabetic, but only take blood sugar pills (oral hypoglycemic), then do not take them on the morning of your procedure.  You may take them after you have had the procedure.  Do not take aspirin or any aspirin-containing medications, at least eleven (11) days prior to the procedure.  They may prolong bleeding.  Wear loose fitting clothing that may be easy to take off and that you would not mind if it got stained with Betadine or blood.  Do not wear any jewelry or perfume  Remove any nail coloring.  It will interfere with some of our monitoring equipment.  NOTE: Remember that this is not meant to be interpreted as a complete list of all possible complications.  Unforeseen problems may occur.  BLOOD THINNERS The following drugs  contain aspirin or other products, which can cause increased bleeding during surgery and should not be taken for 2 weeks prior to and 1 week after surgery.  If you should need take something for relief of minor pain, you may take acetaminophen which is found in Tylenol,m Datril, Anacin-3 and Panadol. It is not blood thinner. The products listed below are.  Do not take any of the products listed below in addition to any listed on your instruction sheet.  A.P.C or A.P.C with Codeine Codeine Phosphate Capsules #3 Ibuprofen Ridaura  ABC compound Congesprin Imuran rimadil  Advil Cope Indocin Robaxisal  Alka-Seltzer Effervescent Pain Reliever and Antacid Coricidin or Coricidin-D  Indomethacin Rufen    Alka-Seltzer plus Cold Medicine Cosprin Ketoprofen S-A-C Tablets  Anacin Analgesic Tablets or Capsules Coumadin Korlgesic Salflex  Anacin Extra Strength Analgesic tablets or capsules CP-2 Tablets Lanoril Salicylate  Anaprox Cuprimine Capsules Levenox Salocol  Anexsia-D Dalteparin Magan Salsalate  Anodynos Darvon compound Magnesium Salicylate Sine-off  Ansaid Dasin Capsules Magsal Sodium Salicylate  Anturane Depen Capsules Marnal Soma  APF Arthritis pain formula Dewitt's Pills Measurin Stanback  Argesic Dia-Gesic Meclofenamic Sulfinpyrazone  Arthritis Bayer Timed Release Aspirin Diclofenac Meclomen Sulindac  Arthritis pain formula Anacin Dicumarol Medipren Supac  Analgesic (Safety coated) Arthralgen Diffunasal Mefanamic Suprofen  Arthritis Strength Bufferin Dihydrocodeine Mepro Compound Suprol  Arthropan liquid Dopirydamole Methcarbomol with Aspirin Synalgos  ASA tablets/Enseals Disalcid Micrainin Tagament  Ascriptin Doan's Midol Talwin  Ascriptin A/D Dolene Mobidin Tanderil  Ascriptin Extra Strength Dolobid Moblgesic Ticlid  Ascriptin with Codeine Doloprin or Doloprin with Codeine Momentum Tolectin  Asperbuf Duoprin Mono-gesic Trendar  Aspergum Duradyne Motrin or Motrin IB Triminicin  Aspirin  plain, buffered or enteric coated Durasal Myochrisine Trigesic  Aspirin Suppositories Easprin Nalfon Trillsate  Aspirin with Codeine Ecotrin Regular or Extra Strength Naprosyn Uracel  Atromid-S Efficin Naproxen Ursinus  Auranofin Capsules Elmiron Neocylate Vanquish  Axotal Emagrin Norgesic Verin  Azathioprine Empirin or Empirin with Codeine Normiflo Vitamin E  Azolid Emprazil Nuprin Voltaren  Bayer Aspirin plain, buffered or children's or timed BC Tablets or powders Encaprin Orgaran Warfarin Sodium  Buff-a-Comp Enoxaparin Orudis Zorpin  Buff-a-Comp with Codeine Equegesic Os-Cal-Gesic   Buffaprin Excedrin plain, buffered or Extra Strength Oxalid   Bufferin Arthritis Strength Feldene Oxphenbutazone   Bufferin plain or Extra Strength Feldene Capsules Oxycodone with Aspirin   Bufferin with Codeine Fenoprofen Fenoprofen Pabalate or Pabalate-SF   Buffets II Flogesic Panagesic   Buffinol plain or Extra Strength Florinal or Florinal with Codeine Panwarfarin   Buf-Tabs Flurbiprofen Penicillamine   Butalbital Compound Four-way cold tablets Penicillin   Butazolidin Fragmin Pepto-Bismol   Carbenicillin Geminisyn Percodan   Carna Arthritis Reliever Geopen Persantine   Carprofen Gold's salt Persistin   Chloramphenicol Goody's Phenylbutazone   Chloromycetin Haltrain Piroxlcam   Clmetidine heparin Plaquenil   Cllnoril Hyco-pap Ponstel   Clofibrate Hydroxy chloroquine Propoxyphen         Before stopping any of these medications, be sure to consult the physician who ordered them.  Some, such as Coumadin (Warfarin) are ordered to prevent or treat serious conditions such as "deep thrombosis", "pumonary embolisms", and other heart problems.  The amount of time that you may need off of the medication may also vary with the medication and the reason for which you were taking it.  If you are taking any of these medications, please make sure you notify your pain physician before you undergo any  procedures.         Facet Blocks Patient Information  Description: The facets are joints in the spine between the vertebrae.  Like any joints in the body, facets can become irritated and painful.  Arthritis can also effect the facets.  By injecting steroids and local anesthetic in and around these joints, we can temporarily block the nerve supply to them.  Steroids act directly on irritated nerves and tissues to reduce selling and inflammation which often leads to decreased pain.  Facet blocks may be done anywhere along the spine from the neck to the low back depending upon the location of your pain.   After numbing the skin with local anesthetic (like Novocaine), a small needle is passed onto the facet joints under x-ray guidance.    You may experience a sensation of pressure while this is being done.  The entire block usually lasts about 15-25 minutes.   Conditions which may be treated by facet blocks:   Low back/buttock pain  Neck/shoulder pain  Certain types of headaches  Preparation for the injection:  1. Do not eat any solid food or dairy products within 8 hours of your appointment. 2. You may drink clear liquid up to 3 hours before appointment.  Clear liquids include water, black coffee, juice or soda.  No milk or cream please. 3. You may take your regular medication, including pain medications, with a sip of water before your appointment.  Diabetics should hold regular insulin (if taken separately) and take 1/2 normal NPH dose the morning of the procedure.  Carry some sugar containing items with you to your appointment. 4. A driver must accompany you and be prepared to drive you home after your procedure. 5. Bring all your current medications with you. 6. An IV may be inserted and sedation may be given at the discretion of the physician. 7. A blood pressure cuff, EKG and other monitors will often be applied during the procedure.  Some patients may need to have extra oxygen  administered for a short period. 8. You will be asked to provide medical information, including your allergies and medications, prior to the procedure.  We must know immediately if you are taking blood thinners (like Coumadin/Warfarin) or if you are allergic to IV iodine contrast (dye).  We must know if you could possible be pregnant.  Possible side-effects:   Bleeding from needle site  Infection (rare, may require surgery)  Nerve injury (rare)  Numbness & tingling (temporary)  Difficulty urinating (rare, temporary)  Spinal headache (a headache worse with upright posture)  Light-headedness (temporary)  Pain at injection site (serveral days)  Decreased blood pressure (rare, temporary)  Weakness in arm/leg (temporary)  Pressure sensation in back/neck (temporary)   Call if you experience:   Fever/chills associated with headache or increased back/neck pain  Headache worsened by an upright position  New onset, weakness or numbness of an extremity below the injection site  Hives or difficulty breathing (go to the emergency room)  Inflammation or drainage at the injection site(s)  Severe back/neck pain greater than usual  New symptoms which are concerning to you  Please note:  Although the local anesthetic injected can often make your back or neck feel good for several hours after the injection, the pain will likely return. It takes 3-7 days for steroids to work.  You may not notice any pain relief for at least one week.  If effective, we will often do a series of 2-3 injections spaced 3-6 weeks apart to maximally decrease your pain.  After the initial series, you may be a candidate for a more permanent nerve block of the facets.  If you have any questions, please call [phone] [facility] 

## 2016-08-06 NOTE — Progress Notes (Signed)
Subjective:  Patient ID: James Hood, male    DOB: 06-19-1940  Age: 76 y.o. MRN: [id number]  CC: Hip Pain (pinched sciatic nerve)   Service Provided on Last Visit: Procedure Procedure: None Previous PROCEDURE:Caudal epidural steroid No. 2 under fluoroscopic guidance with moderate sedation  HPI James Hood presents for reevaluation. He was last seen approximately a month ago at which point he had his second caudal epidural steroid. He is still having considerable pain in the left hip with problems affecting the left lateral calf and ankle. This is his baseline pain and he is shown some moderate but limited improvement with previous caudal epidurals. He is now had a series of 2 without significant improvement. His medications continue to work well for him. Otherwise he is in his usual state of health this time. No changes in sensory or motor function are noted.   By history  He is a pleasant 76 year old white male with a long-standing history of low back pain for over one year. He reports that he had a laminectomy in October of this year with Dr. Joya Salm. He also states that he has continued to have lancinating left posterior and lateral leg pain described as sharp nagging pulsating and throbbing it's present throughout much of the day. He rates this as a VAS of 7 best a 2 and now a 5. He feels that it is gotten worse. He has some chronic intermittent weakness of the left leg. He had an MRI that was reviewed with him today that is in follow-up to his October surgery. He also reports having had 3 epidural steroids prior to his surgery and 1 L5-S1 epidural after this. The pain is worse with bending climbing motion squatting and better with rest sleep. 5 mg of oxycodone does not give him much relief. We have reviewed his physician database and there are no concerns. He also takes Neurontin 1200 mg 3 times a day.  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   His family history includes Depression in his father; Heart attack in his mother; Heart disease in his mother; Hypertension in his father.He reports that he has quit smoking. His smoking use included Cigarettes. He has a 60.00 pack-year smoking history. He has never used smokeless tobacco. He reports that he does not drink alcohol or use drugs.  No results found for this or any previous visit.  [employer] Select 13  Date Value Ref Range Status  05/03/2016 FINAL  Final    Comment:    ==================================================================== [employer] SELECT 13 (MW) ==================================================================== Test                             Result       Flag       Units Drug Present and Declared for Prescription Verification   Oxycodone                      140          EXPECTED   ng/mg  creat   Oxymorphone                    127          EXPECTED   ng/mg creat   Noroxycodone                   903          EXPECTED   ng/mg creat   Noroxymorphone                 71           EXPECTED   ng/mg creat    Sources of oxycodone are scheduled prescription medications.    Oxymorphone, noroxycodone, and noroxymorphone are expected    metabolites of oxycodone. Oxymorphone is also available as a    scheduled prescription medication.   Phenobarbital                  PRESENT      EXPECTED    Phenobarbital is an  expected metabolite of primidone;    Phenobarbital may also be administered as a prescription drug. Drug Absent but Declared for Prescription Verification   Desmethyldiazepam              Not Detected UNEXPECTED ng/mg creat    Desmethyldiazepam is an expected metabolite of chlordiazepoxide,    clorazepate, halazepam, and prazepam.   Hydrocodone                    Not Detected UNEXPECTED ng/mg creat   Tramadol                       Not Detected UNEXPECTED ==================================================================== Test                      Result    Flag   Units      Ref Range   Creatinine              75               mg/dL      >=20 ==================================================================== Declared Medications:  The flagging and interpretation on this report are based on the  following declared medications.  Unexpected results may arise from  inaccuracies in the declared medications.  **Note: The testing scope of this panel includes these medications:  Clorazepate (Tranxene)  Hydrocodone (Norco)  Oxycodone  Oxycodone (Percocet)  Primidone (Mysoline)  Tramadol (Ultram)  **Note: The testing scope of this panel does not include following  reported medications:  Acetaminophen (Norco)  Acetaminophen (Percocet)  Acetaminophen (Tylenol)  Aspirin  Atropine (Lomotil)  Bupropion (Zyban)  Colestipol  Cyclobenzaprine (Flexeril)  Diphenoxylate (Lomotil)  Divalproex (Depakote)  Donepezil (Aricept)  Fenofibrate (Tricor)  Finasteride (Proscar)  Gabapentin (Neurontin)  Glimepiride (Amaryl)  Hydrochlorothiazide (Prinzide)  Hydrochlorothiazide (Zestoretic)  Lisinopril (Prinzide)  Lisinopril (Zestoretic)  Memantine (Namenda)  Metformin (Glucophage)  Metoprolol (Lopressor)  Metoprolol (Toprol)  Omeprazole (Prilosec)  Prednisone (Sterapred)  Propranolol (Inderal)  Quetiapine (Seroquel)  Simvastatin (Zocor)  Tamsulosin  (Flomax) ==================================================================== For clinical consultation, please call [phone]. ====================================================================     Outpatient Medications Prior to Visit  Medication Sig Dispense Refill  . acetaminophen (TYLENOL) 500 MG tablet Take 1,000 mg by mouth as needed.     Marland Kitchen aspirin EC 81 MG tablet Take 81 mg by mouth every morning.     Marland Kitchen buPROPion (ZYBAN) 150 MG 12 hr tablet Take 1  tablet (150 mg total) by mouth 2 (two) times daily. 180 tablet 1  . clorazepate (TRANXENE) 3.75 MG tablet Take 1 tablet (3.75 mg total) by mouth at bedtime as needed for anxiety. 30 tablet 0  . colestipol (COLESTID) 1 g tablet Take 2 g by mouth daily.     . cyclobenzaprine (FLEXERIL) 10 MG tablet Take 10 mg by mouth 3 (three) times daily as needed for muscle spasms.    . diphenoxylate-atropine (LOMOTIL) 2.5-0.025 MG tablet Take 1-2 tablets by mouth 2 (two) times daily. Take 2 tablets in the morning, and 1 tablet in the evening    . divalproex (DEPAKOTE ER) 250 MG 24 hr tablet Take 250 mg by mouth 2 (two) times daily. Pt takes 250mg  in the morning, and 250mg  in the afternoon    . donepezil (ARICEPT) 10 MG tablet Take 10 mg by mouth at bedtime.    . fenofibrate (TRICOR) 145 MG tablet Take 1 tablet (145 mg total) by mouth daily. 90 tablet 1  . finasteride (PROSCAR) 5 MG tablet Take 1 tablet (5 mg total) by mouth daily. 30 tablet 11  . gabapentin (NEURONTIN) 600 MG tablet Take 2 tablets (1,200 mg total) by mouth 3 (three) times daily. 180 tablet 3  . glimepiride (AMARYL) 4 MG tablet Take 1 tablet (4 mg total) by mouth 2 (two) times daily. 180 tablet 3  . lisinopril-hydrochlorothiazide (PRINZIDE,ZESTORETIC) 20-25 MG tablet Take 1 tablet by mouth 2 (two) times daily. 180 tablet 2  . memantine (NAMENDA) 10 MG tablet Take 10 mg by mouth 2 (two) times daily.     . metoprolol succinate (TOPROL-XL) 25 MG 24 hr tablet Take 25 mg by mouth daily.      Marland Kitchen omeprazole (PRILOSEC) 20 MG capsule 20 mg 2 (two) times daily before a meal.     . primidone (MYSOLINE) 50 MG tablet TAKE 5 TABLETS IN THE MORNING AND 3 TABLETS IN THE EVENING    . propranolol (INDERAL) 60 MG tablet Take 60 mg by mouth 2 (two) times daily.     . QUEtiapine (SEROQUEL) 25 MG tablet Take 75 mg by mouth at bedtime.     . simvastatin (ZOCOR) 40 MG tablet Take 1 tablet (40 mg total) by mouth daily. 90 tablet 1  . sitaGLIPtin (JANUVIA) 100 MG tablet Take 1 tablet (100 mg total) by mouth daily. 30 tablet 3  . tamsulosin (FLOMAX) 0.4 MG CAPS capsule Take 0.4 mg by mouth daily after supper.     . metFORMIN (GLUCOPHAGE) 500 MG tablet Take 2 tablets (1,000 mg total) by mouth 2 (two) times daily with a meal. 180 tablet 3  . metoprolol tartrate (LOPRESSOR) 25 MG tablet Take 25 mg by mouth daily.     . nicotine (NICODERM CQ - DOSED IN MG/24 HOURS) 21 mg/24hr patch Place 1 patch (21 mg total) onto the skin daily. (Patient not taking: Reported on 08/06/2016) 42 patch 0  . oxyCODONE (OXY IR/ROXICODONE) 5 MG immediate release tablet Take 1 tablet (5 mg total) by mouth every 6 (six) hours as needed for severe pain. (Patient not taking: Reported on 08/06/2016) 45 tablet 0  . oxyCODONE-acetaminophen (PERCOCET) 7.5-325 MG tablet Take 1 tablet by mouth 3 (three) times daily. 90 tablet 0   Facility-Administered Medications Prior to Visit  Medication Dose Route Frequency Provider Last Rate Last Dose  . iopamidol (ISOVUE-M) 41 % intrathecal injection 20 mL  20 mL Other Once PRN Molli Barrows, MD      . lactated  ringers infusion 1,000 mL  1,000 mL Intravenous Continuous Molli Barrows, MD 125 mL/hr at 06/14/16 1355 1,000 mL at 06/14/16 1355   Lab Results  Component Value Date   WBC 7.8 07/13/2016   HGB 11.6 (L) 07/13/2016   HCT 34.0 (L) 07/13/2016   PLT 354.0 07/13/2016   GLUCOSE 117 (H) 06/19/2016   CHOL 161 09/16/2015   TRIG 393.0 (H) 09/16/2015   HDL 38.50 (L) 09/16/2015   LDLDIRECT 66.0  09/16/2015   ALT 21 06/19/2016   AST 25 06/19/2016   NA 137 06/19/2016   K 4.5 06/19/2016   CL 100 06/19/2016   CREATININE 1.15 06/19/2016   BUN 46 (H) 06/19/2016   CO2 30 06/19/2016   TSH 1.01 06/19/2016   PSA 0.07 (L) 09/16/2015   INR 1.08 11/05/2014   HGBA1C 7.7 (H) 06/13/2016    --------------------------------------------------------------------------------------------------------------------- James Hood  Result Date: 03/29/2016 CLINICAL DATA:  Initial evaluation for chronic low back pain with left-sided sciatica, worsened. EXAM: MRI LUMBAR SPINE WITHOUT Hood TECHNIQUE: Multiplanar, multisequence James imaging of the lumbar spine was performed. No intravenous Hood was administered. COMPARISON:  Prior MRI from 09/22/2015. FINDINGS: Segmentation: L normal segmentation. Lowest well-formed disc is labeled the L5-S1 level. Same numbering system is employed as on previous exams. Alignment: Straightening with slight reversal of the normal lumbar lordosis, stable. No listhesis. Vertebrae: Vertebral body heights are maintained. No evidence for acute or chronic fracture. Reactive endplate changes about the left aspect of the L5-S1 interspace, similar to previous. Postoperative changes from recent decompressive left hemi laminectomy with micro discectomy seen at L5-S1. No concerning features identified. Conus medullaris: Extends to the L1 level and appears normal. Paraspinal and other soft tissues: Normal expected postoperative no concerning features identified. Paraspinous soft tissues otherwise within normal limits. Visualized visceral structures are normal. No retroperitoneal adenopathy. Changes related to decompressive laminectomy present within the posterior paraspinous soft tissues of the lower back. Disc levels: L1-2: Degenerative disc desiccation with intervertebral disc space narrowing and mild disc bulge. No stenosis. L2-3: Degenerative disc desiccation with intervertebral  disc space narrowing and mild disc bulge. No stenosis. L3-4: Degenerative disc desiccation with mild diffuse disc bulge. Mild facet hypertrophy. Mild right foraminal stenosis related to disc bulge and facet disease, stable. No canal or left foraminal narrowing. L4-5: Degenerative disc bulge with intervertebral disc space narrowing and disc desiccation. Disc bulging a centric to the right without focal disc protrusion. Superimposed bilateral facet arthrosis with ligamentum flavum hypertrophy. Reactive effusions within the bilateral L4-5 facets, stable. Minimal bilateral subarticular stenosis, slightly greater on the left. Mild bilateral foraminal narrowing related to disc bulge and facet disease, stable. L5-S1: Postoperative changes from interval decompressive left hemi laminectomy with micro discectomy. Previously seen left subarticular disc protrusion has been largely resected, although there appears to be a small residual central disc protrusion that minimally indents the ventral thecal sac (series 6, image 28). Soft tissue density within the left lateral epidural space likely reflects postoperative granulation tissue, although evaluation somewhat limited due to lack of IV Hood. This surrounds the transiting S1 nerve root in the left lateral recess (series 7, image 28). No significant canal stenosis. Superimposed bilateral facet arthrosis with reactive effusions in the bilateral L5-S1 facets is similar to previous. Mild left foraminal narrowing is relatively unchanged. No significant right foraminal stenosis. IMPRESSION: 1. Postoperative changes from recent decompressive left hemi laminectomy with microdiskectomy at L5-S1. Postoperative granulation tissue within the left epidural space, surrounding the descending left S1 nerve  root. No residual stenosis or concerning features identified. 2. 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 No other  significant stenosis or evidence for impingement. Electronically Signed   By: Jeannine Boga M.D.   On: 03/29/2016 13:10       ---------------------------------------------------------------------------------------------------------------------- Past Medical History:  Diagnosis Date  . Anxiety   . Anxiety and depression   . Arthritis   . Colon polyps   . Dementia   . Depression   . Diabetes mellitus type 2 in nonobese ([facility])   . GERD (gastroesophageal reflux disease)   . Headache   . 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 History of hiatal hernia   . Hypercholesteremia   . Hypertension   . Hyperthyroidism   . Neuropathic pain   . Primary localized osteoarthritis of right knee   . Stomach ulcer   . Tremor, essential     Past Surgical History:  Procedure Laterality Date  . esophageal stretch    . JOINT REPLACEMENT    . LUMBAR LAMINECTOMY/DECOMPRESSION MICRODISCECTOMY Left 02/03/2016   Procedure: LEFT L5-S1 DISKECTOMY;  Surgeon: Leeroy Cha, MD;  Location: [facility];  Service: Neurosurgery;  Laterality: Left;  LEFT L5-S1 DISKECTOMY  . TONSILLECTOMY    . TOTAL KNEE ARTHROPLASTY Right 11/15/2014   Procedure: TOTAL KNEE ARTHROPLASTY;  Surgeon: Elsie Saas, MD;  Location: [facility];  Service: Orthopedics;  Laterality: Right;    Family History  Problem Relation Age of Onset  . Heart attack Mother   . Heart disease Mother   . Hypertension Father   . Depression Father   . Kidney cancer Neg Hx   . Prostate cancer Neg Hx     Social History  Substance Use Topics  . Smoking status: Former Smoker    Packs/day: 1.00    Years: 60.00    Types: Cigarettes  . Smokeless tobacco: Never Used  . 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 Marital status: Married    Spouse name: N/A  . Number of children:  N/A  . Years of education: N/A   Social History Main Topics  . Smoking status: Former Smoker    Packs/day: 1.00    Years: 60.00    Types: Cigarettes  . Smokeless tobacco: Never Used  . 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 Drug use: No  . Sexual activity: Not Currently   Other Topics Concern  . None   Social History Narrative  . None    Scheduled Meds: Continuous Infusions: PRN Meds:.   BP (!) 122/46 (BP Location: Left Arm, Patient Position: Sitting, Cuff Size: Large)   Pulse 80   Temp 98.1 F (36.7 C) (Oral)   Resp 16   Ht 5\' 11"  (1.803 m)   Wt 200 lb (90.7 kg)   SpO2 97%   BMI 27.89 kg/m    BP Readings from Last 3 Encounters:  08/06/16 (!) 122/46  07/18/16 (!) 106/93  06/19/16 118/72     Wt Readings from Last 3 Encounters:  08/06/16 200 lb (90.7 kg)  07/18/16 200 lb (90.7 kg)  06/19/16 195 lb 3.2 oz (88.5 kg)     ----------------------------------------------------------------------------------------------------------------------  ROS Review of Systems  Cardiac: No angina or shortness of breath GI: No constipation  Objective:  BP (!) 122/46 (BP Location: Left Arm, Patient Position: Sitting,  Cuff Size: Large)   Pulse 80   Temp 98.1 F (36.7 C) (Oral)   Resp 16   Ht 5\' 11"  (1.803 m)   Wt 200 lb (90.7 kg)   SpO2 97%   BMI 27.89 kg/m   Physical Exam Patient is alert oriented cooperative compliant Lungs are clear to auscultation Some mild paraspinous muscle tenderness in the lumbar region otherwise no change .He continues to have pain with extension and left lateral rotation at the low back. This is minimally present with right lateral rotation and extension. Strength appears to be at baseline     Assessment & Plan:   Clancey was seen today for hip pain.  Diagnoses and all orders for this visit:  Sciatica of left side associated with disorder of lumbar spine  Chronic left-sided low back pain with left-sided  sciatica -     LUMBAR FACET(MEDIAL BRANCH NERVE BLOCK) MBNB; Future  Facet arthritis of lumbar region ([place]) -     LUMBAR FACET(MEDIAL BRANCH NERVE BLOCK) MBNB; Future  Other orders -     oxyCODONE-acetaminophen (PERCOCET) 7.5-325 MG tablet; Take 1 tablet by mouth 3 (three) times daily.     ----------------------------------------------------------------------------------------------------------------------  Problem List Items Addressed This Visit      Other   Chronic lumbar pain   Relevant Medications   oxyCODONE-acetaminophen (PERCOCET) 7.5-325 MG tablet   Other Relevant Orders   LUMBAR FACET(MEDIAL BRANCH NERVE BLOCK) MBNB    Other Visit Diagnoses    Sciatica of left side associated with disorder of lumbar spine    -  Primary   Facet arthritis of lumbar region ([place])       Relevant Medications   oxyCODONE-acetaminophen (PERCOCET) 7.5-325 MG tablet   Other Relevant Orders   LUMBAR FACET(MEDIAL BRANCH NERVE BLOCK) MBNB      ----------------------------------------------------------------------------------------------------------------------  1. Sciatica of left side associated with disorder of lumbar spine We will defer on repeat caudal today. I want him to continue with back stretching strengthening exercises. We will refill his medications as well for the 7.5 mg hydrocodone. We have reviewed the practitioner database and it is appropriate. 2. Left side lumbar facet arthropathy. We will plan on a diagnostic lumbar facet block at the next visit in one month. We have gone over the risks and benefits of the procedure in detail. 3. Chronic left-sided low back pain with left-sided sciatica We will refill his medications for the next month. He seems to be tolerating this dose regimen well with hydrocodone at the 7.5 mg strength. No diverting or illicit use is noted. Based on his narcotic assessment sheet is well tolerated and improving his sleep and overall lifestyle and function. He  denies any return to alcohol as he does have a previous alcohol dependency history. ----------------------------------------------------------------------------------------------------------------------  I am having James. Alen maintain his divalproex, metoprolol tartrate, cyclobenzaprine, donepezil, QUEtiapine, colestipol, aspirin EC, diphenoxylate-atropine, tamsulosin, propranolol, finasteride, clorazepate, acetaminophen, fenofibrate, simvastatin, metoprolol succinate, omeprazole, memantine, primidone, gabapentin, nicotine, lisinopril-hydrochlorothiazide, buPROPion, sitaGLIPtin, oxyCODONE, glimepiride, and oxyCODONE-acetaminophen. We will continue to administer lactated ringers and iopamidol.   Meds ordered this encounter  Medications  . oxyCODONE-acetaminophen (PERCOCET) 7.5-325 MG tablet    Sig: Take 1 tablet by mouth 3 (three) times daily.    Dispense:  90 tablet    Refill:  0    Do not fill until [id number]        Follow-up: Return in about 1 month (around 09/06/2016) for procedure, med refill.    Molli Barrows, MD @DATE @  The  [place] practitioner database for opioid medications on this patient has been reviewed by me and my staff   Greater than 50% of the total encounter time was spent in counseling and / or coordination of care.     This dictation was performed utilizing Systems analyst.  Please excuse any unintentional or mistaken typographical errors as a result.

## 2016-08-09 ENCOUNTER — Emergency Department: Payer: PPO

## 2016-08-09 ENCOUNTER — Encounter: Payer: Self-pay | Admitting: Emergency Medicine

## 2016-08-09 ENCOUNTER — Inpatient Hospital Stay
Admission: EM | Admit: 2016-08-09 | Discharge: 2016-08-12 | DRG: 564 | Disposition: A | Discharge status: Home / Self Care | Payer: PPO | Attending: Specialist | Admitting: Specialist

## 2016-08-09 ENCOUNTER — Other Ambulatory Visit: Payer: Self-pay | Admitting: Family Medicine

## 2016-08-09 DIAGNOSIS — J101 Influenza due to other identified influenza virus with other respiratory manifestations: Secondary | ICD-10-CM | POA: Diagnosis present

## 2016-08-09 DIAGNOSIS — M549 Dorsalgia, unspecified: Secondary | ICD-10-CM | POA: Diagnosis not present

## 2016-08-09 DIAGNOSIS — Z86010 Personal history of colonic polyps: Secondary | ICD-10-CM

## 2016-08-09 DIAGNOSIS — T796XXA Traumatic ischemia of muscle, initial encounter: Principal | ICD-10-CM | POA: Diagnosis present

## 2016-08-09 DIAGNOSIS — Z8249 Family history of ischemic heart disease and other diseases of the circulatory system: Secondary | ICD-10-CM

## 2016-08-09 DIAGNOSIS — Z87891 Personal history of nicotine dependence: Secondary | ICD-10-CM

## 2016-08-09 DIAGNOSIS — Z7984 Long term (current) use of oral hypoglycemic drugs: Secondary | ICD-10-CM

## 2016-08-09 DIAGNOSIS — G8929 Other chronic pain: Secondary | ICD-10-CM | POA: Diagnosis present

## 2016-08-09 DIAGNOSIS — G250 Essential tremor: Secondary | ICD-10-CM | POA: Diagnosis present

## 2016-08-09 DIAGNOSIS — K219 Gastro-esophageal reflux disease without esophagitis: Secondary | ICD-10-CM | POA: Diagnosis present

## 2016-08-09 DIAGNOSIS — W19XXXA Unspecified fall, initial encounter: Secondary | ICD-10-CM | POA: Diagnosis present

## 2016-08-09 DIAGNOSIS — E1140 Type 2 diabetes mellitus with diabetic neuropathy, unspecified: Secondary | ICD-10-CM | POA: Diagnosis present

## 2016-08-09 DIAGNOSIS — J9801 Acute bronchospasm: Secondary | ICD-10-CM | POA: Diagnosis not present

## 2016-08-09 DIAGNOSIS — F418 Other specified anxiety disorders: Secondary | ICD-10-CM | POA: Diagnosis not present

## 2016-08-09 DIAGNOSIS — G9340 Encephalopathy, unspecified: Secondary | ICD-10-CM | POA: Diagnosis not present

## 2016-08-09 DIAGNOSIS — Y92019 Unspecified place in single-family (private) house as the place of occurrence of the external cause: Secondary | ICD-10-CM

## 2016-08-09 DIAGNOSIS — I10 Essential (primary) hypertension: Secondary | ICD-10-CM | POA: Diagnosis not present

## 2016-08-09 DIAGNOSIS — Z79891 Long term (current) use of opiate analgesic: Secondary | ICD-10-CM | POA: Diagnosis not present

## 2016-08-09 DIAGNOSIS — E860 Dehydration: Secondary | ICD-10-CM | POA: Diagnosis not present

## 2016-08-09 DIAGNOSIS — N400 Enlarged prostate without lower urinary tract symptoms: Secondary | ICD-10-CM | POA: Diagnosis not present

## 2016-08-09 DIAGNOSIS — F0390 Unspecified dementia without behavioral disturbance: Secondary | ICD-10-CM | POA: Diagnosis not present

## 2016-08-09 DIAGNOSIS — Z818 Family history of other mental and behavioral disorders: Secondary | ICD-10-CM

## 2016-08-09 DIAGNOSIS — N179 Acute kidney failure, unspecified: Secondary | ICD-10-CM | POA: Diagnosis not present

## 2016-08-09 DIAGNOSIS — R400 Somnolence: Secondary | ICD-10-CM

## 2016-08-09 DIAGNOSIS — E785 Hyperlipidemia, unspecified: Secondary | ICD-10-CM | POA: Diagnosis present

## 2016-08-09 DIAGNOSIS — Z8711 Personal history of peptic ulcer disease: Secondary | ICD-10-CM

## 2016-08-09 DIAGNOSIS — M6282 Rhabdomyolysis: Secondary | ICD-10-CM | POA: Diagnosis not present

## 2016-08-09 DIAGNOSIS — E0590 Thyrotoxicosis, unspecified without thyrotoxic crisis or storm: Secondary | ICD-10-CM | POA: Diagnosis present

## 2016-08-09 DIAGNOSIS — J111 Influenza due to unidentified influenza virus with other respiratory manifestations: Secondary | ICD-10-CM | POA: Diagnosis present

## 2016-08-09 DIAGNOSIS — Z7982 Long term (current) use of aspirin: Secondary | ICD-10-CM

## 2016-08-09 DIAGNOSIS — E7800 Pure hypercholesterolemia, unspecified: Secondary | ICD-10-CM | POA: Diagnosis present

## 2016-08-09 DIAGNOSIS — Z79899 Other long term (current) drug therapy: Secondary | ICD-10-CM

## 2016-08-09 DIAGNOSIS — Z96651 Presence of right artificial knee joint: Secondary | ICD-10-CM | POA: Diagnosis not present

## 2016-08-09 DIAGNOSIS — E1142 Type 2 diabetes mellitus with diabetic polyneuropathy: Secondary | ICD-10-CM

## 2016-08-09 DIAGNOSIS — R4182 Altered mental status, unspecified: Secondary | ICD-10-CM | POA: Diagnosis not present

## 2016-08-09 LAB — URINALYSIS, COMPLETE (UACMP) WITH MICROSCOPIC
Bacteria, UA: NONE SEEN
Bilirubin Urine: NEGATIVE
Glucose, UA: NEGATIVE mg/dL
Ketones, ur: 5 mg/dL — AB
Leukocytes, UA: NEGATIVE
Nitrite: NEGATIVE
Protein, ur: NEGATIVE mg/dL
Specific Gravity, Urine: 1.018 (ref 1.005–1.030)
Squamous Epithelial / LPF: NONE SEEN
pH: 5 (ref 5.0–8.0)

## 2016-08-09 LAB — URINE DRUG SCREEN, QUALITATIVE (ARMC ONLY)
Amphetamines, Ur Screen: NOT DETECTED
Barbiturates, Ur Screen: POSITIVE — AB
Benzodiazepine, Ur Scrn: NOT DETECTED
Cannabinoid 50 Ng, Ur ~~LOC~~: NOT DETECTED
Cocaine Metabolite,Ur ~~LOC~~: NOT DETECTED
MDMA (Ecstasy)Ur Screen: NOT DETECTED
Methadone Scn, Ur: NOT DETECTED
Opiate, Ur Screen: NOT DETECTED
Phencyclidine (PCP) Ur S: NOT DETECTED
Tricyclic, Ur Screen: NOT DETECTED

## 2016-08-09 LAB — COMPREHENSIVE METABOLIC PANEL
ALT: 49 U/L (ref 17–63)
AST: 93 U/L — ABNORMAL HIGH (ref 15–41)
Albumin: 3.9 g/dL (ref 3.5–5.0)
Alkaline Phosphatase: 28 U/L — ABNORMAL LOW (ref 38–126)
Anion gap: 13 (ref 5–15)
BUN: 38 mg/dL — ABNORMAL HIGH (ref 6–20)
CO2: 26 mmol/L (ref 22–32)
Calcium: 8.8 mg/dL — ABNORMAL LOW (ref 8.9–10.3)
Chloride: 93 mmol/L — ABNORMAL LOW (ref 101–111)
Creatinine, Ser: 1.32 mg/dL — ABNORMAL HIGH (ref 0.61–1.24)
GFR calc Af Amer: 59 mL/min — ABNORMAL LOW (ref >60)
GFR calc non Af Amer: 51 mL/min — ABNORMAL LOW (ref >60)
Glucose, Bld: 125 mg/dL — ABNORMAL HIGH (ref 65–99)
Potassium: 4.2 mmol/L (ref 3.5–5.1)
Sodium: 132 mmol/L — ABNORMAL LOW (ref 135–145)
Total Bilirubin: 0.6 mg/dL (ref 0.3–1.2)
Total Protein: 7.4 g/dL (ref 6.5–8.1)

## 2016-08-09 LAB — CBC WITH DIFFERENTIAL/PLATELET
Basophils Absolute: 0 10*3/uL (ref 0–0.1)
Basophils Relative: 0 %
Eosinophils Absolute: 0 10*3/uL (ref 0–0.7)
Eosinophils Relative: 0 %
HCT: 34.7 % — ABNORMAL LOW (ref 40.0–52.0)
Hemoglobin: 11.9 g/dL — ABNORMAL LOW (ref 13.0–18.0)
Lymphocytes Relative: 7 %
Lymphs Abs: 0.6 10*3/uL — ABNORMAL LOW (ref 1.0–3.6)
MCH: 31.8 pg (ref 26.0–34.0)
MCHC: 34.3 g/dL (ref 32.0–36.0)
MCV: 92.5 fL (ref 80.0–100.0)
Monocytes Absolute: 0.8 10*3/uL (ref 0.2–1.0)
Monocytes Relative: 9 %
Neutro Abs: 7.3 10*3/uL — ABNORMAL HIGH (ref 1.4–6.5)
Neutrophils Relative %: 84 %
Platelets: 259 10*3/uL (ref 150–440)
RBC: 3.75 MIL/uL — ABNORMAL LOW (ref 4.40–5.90)
RDW: 14 % (ref 11.5–14.5)
WBC: 8.7 10*3/uL (ref 3.8–10.6)

## 2016-08-09 LAB — INFLUENZA PANEL BY PCR (TYPE A & B)
Influenza A By PCR: NEGATIVE
Influenza B By PCR: POSITIVE — AB

## 2016-08-09 LAB — GLUCOSE, CAPILLARY
Glucose-Capillary: 85 mg/dL (ref 65–99)
Glucose-Capillary: 115 mg/dL — ABNORMAL HIGH (ref 65–99)

## 2016-08-09 LAB — CK: Total CK: 3423 U/L — ABNORMAL HIGH (ref 49–397)

## 2016-08-09 LAB — AMMONIA: Ammonia: 14 umol/L (ref 9–35)

## 2016-08-09 LAB — TSH: TSH: 0.49 u[IU]/mL (ref 0.350–4.500)

## 2016-08-09 LAB — ETHANOL: Alcohol, Ethyl (B): <5 mg/dL (ref <5)

## 2016-08-09 MED ORDER — DONEPEZIL HCL 5 MG PO TABS
10.0000 mg | ORAL_TABLET | Freq: Every day | ORAL | Status: DC
  Administered 2016-08-09 – 2016-08-11 (×3): 10 mg via ORAL
  Filled 2016-08-09 (×4): qty 2

## 2016-08-09 MED ORDER — INSULIN ASPART 100 UNIT/ML ~~LOC~~ SOLN
0.0000 [IU] | Freq: Three times a day (TID) | SUBCUTANEOUS | Status: DC
  Administered 2016-08-10 (×3): 1 [IU] via SUBCUTANEOUS
  Administered 2016-08-11 (×2): 2 [IU] via SUBCUTANEOUS
  Filled 2016-08-09 (×2): qty 1
  Filled 2016-08-09 (×2): qty 2
  Filled 2016-08-09: qty 1

## 2016-08-09 MED ORDER — OSELTAMIVIR PHOSPHATE 75 MG PO CAPS
75.0000 mg | ORAL_CAPSULE | Freq: Once | ORAL | Status: AC
  Administered 2016-08-09: 75 mg via ORAL
  Filled 2016-08-09: qty 1

## 2016-08-09 MED ORDER — ENOXAPARIN SODIUM 40 MG/0.4ML ~~LOC~~ SOLN
40.0000 mg | SUBCUTANEOUS | Status: DC
  Administered 2016-08-09 – 2016-08-11 (×3): 40 mg via SUBCUTANEOUS
  Filled 2016-08-09 (×3): qty 0.4

## 2016-08-09 MED ORDER — ONDANSETRON HCL 4 MG PO TABS: 4.0000 mg | ORAL_TABLET | Freq: Four times a day (QID) | ORAL | Status: DC | PRN

## 2016-08-09 MED ORDER — OSELTAMIVIR PHOSPHATE 30 MG PO CAPS
30.0000 mg | ORAL_CAPSULE | Freq: Two times a day (BID) | ORAL | Status: DC
  Administered 2016-08-09 – 2016-08-12 (×6): 30 mg via ORAL
  Filled 2016-08-09 (×7): qty 1

## 2016-08-09 MED ORDER — DIVALPROEX SODIUM ER 250 MG PO TB24
250.0000 mg | ORAL_TABLET | Freq: Two times a day (BID) | ORAL | Status: DC
  Administered 2016-08-09 – 2016-08-12 (×6): 250 mg via ORAL
  Filled 2016-08-09 (×6): qty 1

## 2016-08-09 MED ORDER — COLESTIPOL HCL 1 G PO TABS
2.0000 g | ORAL_TABLET | Freq: Every day | ORAL | Status: DC
  Administered 2016-08-10 – 2016-08-12 (×3): 2 g via ORAL
  Filled 2016-08-09 (×3): qty 2

## 2016-08-09 MED ORDER — ACETAMINOPHEN 650 MG RE SUPP: 650.0000 mg | Freq: Four times a day (QID) | RECTAL | Status: DC | PRN

## 2016-08-09 MED ORDER — ASPIRIN EC 81 MG PO TBEC
81.0000 mg | DELAYED_RELEASE_TABLET | ORAL | Status: DC
  Administered 2016-08-09 – 2016-08-12 (×4): 81 mg via ORAL
  Filled 2016-08-09 (×4): qty 1

## 2016-08-09 MED ORDER — FENOFIBRATE 160 MG PO TABS
160.0000 mg | ORAL_TABLET | Freq: Every day | ORAL | Status: DC
  Administered 2016-08-09 – 2016-08-12 (×4): 160 mg via ORAL
  Filled 2016-08-09 (×4): qty 1

## 2016-08-09 MED ORDER — GABAPENTIN 600 MG PO TABS
1200.0000 mg | ORAL_TABLET | Freq: Three times a day (TID) | ORAL | Status: DC
  Administered 2016-08-09 – 2016-08-12 (×9): 1200 mg via ORAL
  Filled 2016-08-09 (×9): qty 2

## 2016-08-09 MED ORDER — FINASTERIDE 5 MG PO TABS
5.0000 mg | ORAL_TABLET | Freq: Every day | ORAL | Status: DC
  Administered 2016-08-09 – 2016-08-12 (×4): 5 mg via ORAL
  Filled 2016-08-09 (×4): qty 1

## 2016-08-09 MED ORDER — MEMANTINE HCL 10 MG PO TABS
10.0000 mg | ORAL_TABLET | Freq: Two times a day (BID) | ORAL | Status: DC
  Administered 2016-08-09 – 2016-08-12 (×7): 10 mg via ORAL
  Filled 2016-08-09 (×7): qty 1

## 2016-08-09 MED ORDER — PRIMIDONE 50 MG PO TABS
150.0000 mg | ORAL_TABLET | Freq: Every evening | ORAL | Status: DC
  Administered 2016-08-09 – 2016-08-11 (×3): 150 mg via ORAL
  Filled 2016-08-09 (×3): qty 3

## 2016-08-09 MED ORDER — SODIUM CHLORIDE 0.9 % IV SOLN
INTRAVENOUS | Status: DC
  Administered 2016-08-09 – 2016-08-12 (×6): via INTRAVENOUS

## 2016-08-09 MED ORDER — GUAIFENESIN 100 MG/5ML PO SOLN
5.0000 mL | ORAL | Status: DC | PRN
  Administered 2016-08-10 – 2016-08-12 (×5): 100 mg via ORAL
  Filled 2016-08-09 (×5): qty 10

## 2016-08-09 MED ORDER — ONDANSETRON HCL 4 MG/2ML IJ SOLN: 4.0000 mg | Freq: Four times a day (QID) | INTRAMUSCULAR | Status: DC | PRN

## 2016-08-09 MED ORDER — PRIMIDONE 250 MG PO TABS
250.0000 mg | ORAL_TABLET | Freq: Every morning | ORAL | Status: DC
  Administered 2016-08-10 – 2016-08-12 (×3): 250 mg via ORAL
  Filled 2016-08-09 (×3): qty 1

## 2016-08-09 MED ORDER — INSULIN ASPART 100 UNIT/ML ~~LOC~~ SOLN: 0.0000 [IU] | Freq: Every day | SUBCUTANEOUS | Status: DC

## 2016-08-09 MED ORDER — QUETIAPINE FUMARATE 25 MG PO TABS
75.0000 mg | ORAL_TABLET | Freq: Every day | ORAL | Status: DC
  Administered 2016-08-09 – 2016-08-11 (×3): 75 mg via ORAL
  Filled 2016-08-09 (×3): qty 3

## 2016-08-09 MED ORDER — CLORAZEPATE DIPOTASSIUM 3.75 MG PO TABS: 3.7500 mg | ORAL_TABLET | Freq: Every evening | ORAL | Status: DC | PRN

## 2016-08-09 MED ORDER — PROPRANOLOL HCL 20 MG PO TABS
60.0000 mg | ORAL_TABLET | Freq: Two times a day (BID) | ORAL | Status: DC
  Administered 2016-08-09 – 2016-08-12 (×7): 60 mg via ORAL
  Filled 2016-08-09 (×7): qty 3

## 2016-08-09 MED ORDER — METOPROLOL SUCCINATE ER 25 MG PO TB24
25.0000 mg | ORAL_TABLET | Freq: Every day | ORAL | Status: DC
  Administered 2016-08-09 – 2016-08-12 (×4): 25 mg via ORAL
  Filled 2016-08-09 (×4): qty 1

## 2016-08-09 MED ORDER — DIPHENOXYLATE-ATROPINE 2.5-0.025 MG PO TABS
2.0000 | ORAL_TABLET | Freq: Every morning | ORAL | Status: DC
  Administered 2016-08-10 – 2016-08-12 (×3): 2 via ORAL
  Filled 2016-08-09 (×3): qty 2

## 2016-08-09 MED ORDER — BUPROPION HCL ER (SR) 150 MG PO TB12
150.0000 mg | ORAL_TABLET | Freq: Two times a day (BID) | ORAL | Status: DC
  Administered 2016-08-09 – 2016-08-12 (×6): 150 mg via ORAL
  Filled 2016-08-09 (×7): qty 1

## 2016-08-09 MED ORDER — SODIUM CHLORIDE 0.9 % IV BOLUS (SEPSIS)
1000.0000 mL | Freq: Once | INTRAVENOUS | Status: AC
  Administered 2016-08-09: 1000 mL via INTRAVENOUS

## 2016-08-09 MED ORDER — COLESTIPOL HCL 1 G PO TABS
2.0000 g | ORAL_TABLET | Freq: Every day | ORAL | Status: DC
  Administered 2016-08-09: 19:00:00 2 g via ORAL
  Filled 2016-08-09: qty 2

## 2016-08-09 MED ORDER — IPRATROPIUM-ALBUTEROL 0.5-2.5 (3) MG/3ML IN SOLN
3.0000 mL | Freq: Four times a day (QID) | RESPIRATORY_TRACT | Status: DC | PRN
  Administered 2016-08-09 – 2016-08-11 (×2): 3 mL via RESPIRATORY_TRACT
  Filled 2016-08-09 (×2): qty 3

## 2016-08-09 MED ORDER — OXYCODONE-ACETAMINOPHEN 7.5-325 MG PO TABS
1.0000 | ORAL_TABLET | Freq: Three times a day (TID) | ORAL | Status: DC
  Administered 2016-08-10 – 2016-08-12 (×7): 1 via ORAL
  Filled 2016-08-09 (×9): qty 1

## 2016-08-09 MED ORDER — TAMSULOSIN HCL 0.4 MG PO CAPS
0.4000 mg | ORAL_CAPSULE | Freq: Every day | ORAL | Status: DC
  Administered 2016-08-09 – 2016-08-11 (×3): 0.4 mg via ORAL
  Filled 2016-08-09 (×3): qty 1

## 2016-08-09 MED ORDER — SIMVASTATIN 20 MG PO TABS
40.0000 mg | ORAL_TABLET | Freq: Every day | ORAL | Status: DC
  Administered 2016-08-09 – 2016-08-12 (×4): 40 mg via ORAL
  Filled 2016-08-09 (×4): qty 2

## 2016-08-09 MED ORDER — ACETAMINOPHEN 325 MG PO TABS
650.0000 mg | ORAL_TABLET | Freq: Four times a day (QID) | ORAL | Status: DC | PRN
  Administered 2016-08-10: 325 mg via ORAL
  Administered 2016-08-10 – 2016-08-11 (×3): 650 mg via ORAL
  Filled 2016-08-09 (×4): qty 2

## 2016-08-09 MED ORDER — PANTOPRAZOLE SODIUM 40 MG PO TBEC
40.0000 mg | DELAYED_RELEASE_TABLET | Freq: Every day | ORAL | Status: DC
  Administered 2016-08-09 – 2016-08-12 (×4): 40 mg via ORAL
  Filled 2016-08-09 (×4): qty 1

## 2016-08-09 MED ORDER — DIPHENOXYLATE-ATROPINE 2.5-0.025 MG PO TABS
1.0000 | ORAL_TABLET | Freq: Every day | ORAL | Status: DC
  Administered 2016-08-09 – 2016-08-11 (×3): 1 via ORAL
  Filled 2016-08-09 (×4): qty 1

## 2016-08-09 NOTE — ED Notes (Signed)
Patient transported to X-ray 

## 2016-08-09 NOTE — ED Triage Notes (Addendum)
Pt found in floor by wife this morning. Has laid in floor all night per report. Was altered when going to bed per wife and when he didn't come down she went to check on him and found in floor. He has had cough per report. Redness to right hip, side, arm, and face from laying in floor. Slurred speech at present. Goes to pain management clinic. Was incontinent, normally not.

## 2016-08-09 NOTE — ED Notes (Signed)
ED Provider at bedside. 

## 2016-08-09 NOTE — ED Provider Notes (Addendum)
Time Seen: Approximately 1117  I have reviewed the triage notes  Chief Complaint: Altered Mental Status   History of Present Illness: James Hood is a 76 y.o. male * who was found by his wife on the floor this morning. The patient does not remember falling onto the floor. He apparently went to bed at 10 PM last night. He was feeling fine other than his chronic pain and had a recent increase in his oxycodone. Patient was found this morning at approximately 10 AM by his wife very drowsy On his right side. The patient denies any pain and seems to be gradually waking up. He denies any current headache, chest pain, abdominal pain or focal weakness. He denies any pain in the hip or upper extremities. The wife found him very incontinent with dried stool and urine on the floor. She is not sure exactly what time he fell.   Past Medical History:  Diagnosis Date  . Anxiety   . Anxiety and depression   . Arthritis   . Colon polyps   . Dementia   . Depression   . Diabetes mellitus type 2 in nonobese (HCC)   . GERD (gastroesophageal reflux disease)   . Headache   . 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 History of hiatal hernia   . Hypercholesteremia   . Hypertension   . Hyperthyroidism   . Neuropathic pain   . Primary localized osteoarthritis of right knee   . Stomach ulcer   . Tremor, essential     Patient Active Problem List   Diagnosis Date Noted  . Night sweats 06/19/2016  . Sinusitis, acute maxillary 05/30/2016  . Tobacco abuse counseling 05/30/2016  . Lumbar herniated disc 02/03/2016  . Hypertriglyceridemia 12/09/2015  . Anemia 12/09/2015  . Has a tremor 10/02/2015  . Falls 09/16/2015  . Barrett's esophagus 09/16/2015  . Chronic lumbar pain 09/16/2015  . BPH (benign prostatic hypertrophy) 09/16/2015  . Trochanteric bursitis of left hip 09/09/2015  . Headache 09/09/2015  . Difficulty in walking 06/01/2015  . Amnesia 06/01/2015  . Facial droop 05/03/2015  . Benign  essential tremor 03/10/2015  . HLD (hyperlipidemia) 02/23/2015  . Episode of syncope 11/23/2014  . DJD (degenerative joint disease) of knee 11/15/2014  . Hypertension   . Tremor, essential   . Primary localized osteoarthritis of right knee   . Anxiety and depression   . Clinical depression 01/06/2014  . Absence of sensation 11/18/2013  . Current tobacco use 11/18/2013  . Chronic diarrhea 11/12/2013  . Benign neoplasm of colon 08/25/2013  . 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 Type 2 diabetes mellitus ([place]) 10/06/2012  . Static tremor 10/06/2012    Past Surgical History:  Procedure Laterality Date  . esophageal stretch    . JOINT REPLACEMENT    . LUMBAR LAMINECTOMY/DECOMPRESSION MICRODISCECTOMY Left 02/03/2016   Procedure: LEFT L5-S1 DISKECTOMY;  Surgeon: Leeroy Cha, MD;  Location: [facility];  Service: Neurosurgery;  Laterality: Left;  LEFT L5-S1 DISKECTOMY  . TONSILLECTOMY    . TOTAL KNEE ARTHROPLASTY Right 11/15/2014   Procedure: TOTAL KNEE ARTHROPLASTY;  Surgeon: Elsie Saas, MD;  Location: [place];  Service: Orthopedics;  Laterality: Right;    Past Surgical History:  Procedure Laterality Date  . esophageal stretch    . JOINT REPLACEMENT    . LUMBAR LAMINECTOMY/DECOMPRESSION MICRODISCECTOMY Left 02/03/2016   Procedure: LEFT L5-S1 DISKECTOMY;  Surgeon: Leeroy Cha, MD;  Location: [place] [facility];  Service: Neurosurgery;  Laterality: Left;  LEFT L5-S1 DISKECTOMY  .  TONSILLECTOMY    . TOTAL KNEE ARTHROPLASTY Right 11/15/2014   Procedure: TOTAL KNEE ARTHROPLASTY;  Surgeon: Elsie Saas, MD;  Location: [facility];  Service: Orthopedics;  Laterality: Right;    Current Outpatient Rx  . Order #: [id number] Class: Historical Med  . Order #: [id number] Class: Normal  . Order #: [id number] Class: Historical Med  . Order #: [id number] Class: Historical Med  . Order #: [id number] Class: Historical Med  . Order #: [id number] Class: Historical Med  . Order #: [id number] Class: Normal  . Order #:  [id number] Class: Normal  . Order #: [id number] Class: Normal  . Order #: [id number] Class: Normal  . Order #: [id number] Class: Historical Med  . Order #: [id number] Class: Normal  . Order #: [id number] Class: Historical Med  . Order #: [id number] Class: Historical Med  . Order #: [id number] Class: Print  . Order #: [id number] Class: Print  . Order #: [id number] Class: Historical Med  . Order #: [id number] Class: Historical Med  . Order #: [id number] Class: Historical Med  . Order #: [id number] Class: Normal  . Order #: [id number] Class: Normal  . Order #: [id number] Class: Historical Med  . Order #: [id number] Class: Historical Med  . Order #: [id number] Class: Print  . Order #: [id number] Class: Historical Med  . Order #: [id number] Class: Normal    Allergies:  No known allergies  Family History: Family History  Problem Relation Age of Onset  . Heart attack Mother   . Heart disease Mother   . Hypertension Father   . Depression Father   . Kidney cancer Neg Hx   . Prostate cancer Neg Hx     Social History: Social History  Substance Use Topics  . Smoking status: Former Smoker    Packs/day: 1.00    Years: 60.00    Types: Cigarettes  . Smokeless tobacco: Never Used  . 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     Review of Systems:   10 point review of systems was performed and was otherwise negative:  Constitutional: No fever Eyes: No visual disturbances ENT: No sore throat, ear pain Cardiac: No chest pain Respiratory: No shortness of breath, wheezing, or stridor Abdomen: No abdominal pain, no vomiting, No diarrhea Endocrine: No weight loss, No night sweats Extremities: No peripheral edema, cyanosis Skin: No rashes, easy bruising Neurologic: No focal weakness, trouble with speech or swollowing Urologic: No dysuria, Hematuria, or urinary frequency Patient's been recently diagnosed with sciatica  Physical Exam:  ED Triage Vitals  Enc Vitals Group     BP 08/09/16 1107  130/69     Pulse Rate 08/09/16 1106 (!) 107     Resp 08/09/16 1106 (!) 22     Temp 08/09/16 1106 99.1 F (37.3 C)     Temp Source 08/09/16 1106 Oral     SpO2 08/09/16 1106 94 %     Weight 08/09/16 1105 200 lb (90.7 kg)     Height 08/09/16 1105 5\' 11"  (1.803 m)     Head Circumference --      Peak Flow --      Pain Score --      Pain Loc --      Pain Edu? --      Excl. in [place]? --     General: Awake , Alert , and Oriented times3  Gradually able to answer questions appropriately but confused at times.  Head: Normal cephalic , atraumatic Eyes: Pupils equal , round, reactive to light Nose/Throat: No nasal drainage, patent upper airway without erythema or exudate.  Neck: Supple, Full range  of motion, No anterior adenopathy or palpable thyroid masses Lungs: Clear to ascultation without wheezes , rhonchi, or rales Heart: Regular rate, regular rhythm without murmurs , gallops , or rubs Abdomen: Soft, non tender without rebound, guarding , or rigidity; bowel sounds positive and symmetric in all 4 quadrants. No organomegaly .        Extremities: 2 plus symmetric pulses. No edema, clubbing or cyanosis Neurologic: normal ambulation, Motor symmetric without deficits, sensory intact Skin: warm, dry, no rashes   Labs:   All laboratory work was reviewed including any pertinent negatives or positives listed below:  Labs Reviewed  COMPREHENSIVE METABOLIC PANEL - Abnormal; Notable for the following:       Result Value   Sodium 132 (*)    Chloride 93 (*)    Glucose, Bld 125 (*)    BUN 38 (*)    Creatinine, Ser 1.32 (*)    Calcium 8.8 (*)    AST 93 (*)    Alkaline Phosphatase 28 (*)    GFR calc non Af Amer 51 (*)    GFR calc Af Amer 59 (*)    All other components within normal limits  CBC WITH DIFFERENTIAL/PLATELET - Abnormal; Notable for the following:    RBC 3.75 (*)    Hemoglobin 11.9 (*)    HCT 34.7 (*)    Neutro Abs 7.3 (*)    Lymphs Abs 0.6 (*)    All other components within  normal limits  CK - Abnormal; Notable for the following:    Total CK 3,423 (*)    All other components within normal limits  URINALYSIS, COMPLETE (UACMP) WITH MICROSCOPIC - Abnormal; Notable for the following:    Color, Urine YELLOW (*)    APPearance CLEAR (*)    Hgb urine dipstick LARGE (*)    Ketones, ur 5 (*)    All other components within normal limits  ETHANOL  INFLUENZA PANEL BY PCR (TYPE A & B)  Patient has an elevated BUN and creatinine levels with a CK of greater than 3000  EKG:  ED ECG REPORT I, Daymon Larsen, the attending physician, personally viewed and interpreted this ECG.  Date: 08/09/2016 EKG Time: 1110 Rate: 105 Rhythm: Sinus tachycardia QRS Axis: normal Intervals: normal ST/T Wave abnormalities: normal Conduction Disturbances: none Narrative Interpretation: unremarkable No acute ischemic changes  Radiology:  "Dg Chest 2 View  Result Date: 08/09/2016 CLINICAL DATA:  Altered mental status. EXAM: CHEST  2 VIEW COMPARISON:  Radiographs of May 29, 2016. FINDINGS: Stable cardiomediastinal silhouette. Old left rib fractures are noted. No pneumothorax or pleural effusion is noted. No acute pulmonary disease is noted. IMPRESSION: No active cardiopulmonary disease. Electronically Signed   By: Marijo Conception, M.D.   On: 08/09/2016 12:25   Ct Head Wo Contrast  Result Date: 08/09/2016 CLINICAL DATA:  Altered mental status. Slurred speech. Incontinence. EXAM: CT HEAD WITHOUT CONTRAST TECHNIQUE: Contiguous axial images were obtained from the base of the skull through the vertex without intravenous contrast. COMPARISON:  Brain MR of 05/27/2015.  CT of 04/15/2008. FINDINGS: Brain: Mild low density in the periventricular white matter likely related to small vessel disease. Mild ventriculomegaly is similar and likely related to cerebral atrophy. No hemorrhage, acute infarct, mass lesion, intra-axial, or extra-axial fluid collection. Vascular: Intracranial atherosclerosis.  Skull: No significant soft tissue swelling.  No skull fracture. Sinuses/Orbits: Normal imaged portions of the orbits and globes. Mucosal thickening of bilateral maxillary sinuses and ethmoid air cells. Clear mastoid air cells.  Other: None. IMPRESSION: 1.  No acute intracranial abnormality. 2.  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 3. Sinus disease. Electronically Signed   By: Abigail Miyamoto M.D.   On: 08/09/2016 12:38   Dg C-arm 1-60 Min-no Report  Result Date: 07/18/2016 Fluoroscopy was utilized by the requesting physician.  No radiographic interpretation.  "    I personally reviewed the radiologic studies    ED Course:  The patient still remained somnolent and his findings of dehydration along with some mild rhabdo mild lysis. Patient was started on IV fluids and his head CT shows no focal abnormalities. Unclear on why he is still very somnolent. He does not exhibit any focal neurologic deficits such as indicated for an acute ischemic stroke. The patient's had some laboratory tests added to his workup and his case was reviewed with the hospitalist team, further disposition and management depends upon their evaluation and his wife states that she manages his Percocet tablets and states he did not change on his dosage regimen.     Assessment:  Acute mental status Influenza Dehydration      Plan:  Inpatient           Daymon Larsen, MD 08/09/16 [place] Virat Prather, MD 08/09/16 1420

## 2016-08-09 NOTE — ED Notes (Signed)
Admitting MD at bedside.

## 2016-08-09 NOTE — H&P (Signed)
[facility] [place] NAME: James Hood    MR#:  [id number]  DATE OF BIRTH:  1941/02/10  DATE OF ADMISSION:  08/09/2016  PRIMARY CARE PHYSICIAN: Tommi Rumps, MD   REQUESTING/REFERRING PHYSICIAN: Dr. Meade Maw  CHIEF COMPLAINT:   Chief Complaint  Patient presents with  . Altered Mental Status  Flu, Rhabdomyolysis.   HISTORY OF PRESENT ILLNESS:  James Hood  is a 76 y.o. male with a known history of Dementia, anxiety/depression, diabetes, GERD, history of alcohol abuse, hypertension, hyperlipidemia, neuropathy, essential tremor who presents to the hospital due to altered mental status and being found down at home. Patient wife gives most of the history is a patient cannot recall the events. As per the wife patient was in his usual state of health yesterday evening and he went to bed around 10:30. She woke up this morning and found him on the floor asked to his bed with some urine and feces next to it. He cannot recall when he fell or any events prior to his fall. Patient remained somewhat lethargic this afternoon. Patient was noted to be in acute kidney injury and also to have acute rhabdomyolysis. He was also noted to be positive for the flu. Hospitalist services were contacted further treatment evaluation. As per the wife she denies any recent fevers, chills, shortness of breath, cough congestion or any recent sick contacts.  PAST MEDICAL HISTORY:   Past Medical History:  Diagnosis Date  . Anxiety   . Anxiety and depression   . Arthritis   . Colon polyps   . Dementia   . Depression   . Diabetes mellitus type 2 in nonobese (HCC)   . GERD (gastroesophageal reflux disease)   . Headache   . 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 History of hiatal hernia   . Hypercholesteremia   . Hypertension   . Hyperthyroidism   . Neuropathic pain   . Primary localized osteoarthritis of right knee   . Stomach ulcer   . Tremor, essential      PAST SURGICAL HISTORY:   Past Surgical History:  Procedure Laterality Date  . esophageal stretch    . JOINT REPLACEMENT    . LUMBAR LAMINECTOMY/DECOMPRESSION MICRODISCECTOMY Left 02/03/2016   Procedure: LEFT L5-S1 DISKECTOMY;  Surgeon: Leeroy Cha, MD;  Location: [place];  Service: Neurosurgery;  Laterality: Left;  LEFT L5-S1 DISKECTOMY  . TONSILLECTOMY    . TOTAL KNEE ARTHROPLASTY Right 11/15/2014   Procedure: TOTAL KNEE ARTHROPLASTY;  Surgeon: Elsie Saas, MD;  Location: [place];  Service: Orthopedics;  Laterality: Right;    SOCIAL HISTORY:   Social History  Substance Use Topics  . Smoking status: Current Every Day Smoker    Packs/day: 1.00    Years: 60.00    Types: Cigarettes  . Smokeless tobacco: Never Used  . Alcohol use No     Comment: recovery alcoholic 27 yrs sober    FAMILY HISTORY:   Family History  Problem Relation Age of Onset  . Heart attack Mother   . Heart disease Mother   . Hypertension Father   . Depression Father   . Kidney cancer Neg Hx   . Prostate cancer Neg Hx     DRUG ALLERGIES:   Allergies  Allergen Reactions  . No Known Allergies     REVIEW OF SYSTEMS:   Review of Systems  Constitutional: Negative for fever and weight loss.  HENT: Negative for congestion, nosebleeds and tinnitus.  Eyes: Negative for blurred vision, double vision and redness.  Respiratory: Negative for cough, hemoptysis and shortness of breath.   Cardiovascular: Negative for chest pain, orthopnea, leg swelling and PND.  Gastrointestinal: Negative for abdominal pain, diarrhea, melena, nausea and vomiting.  Genitourinary: Negative for dysuria, hematuria and urgency.  Musculoskeletal: Positive for falls. Negative for joint pain.  Neurological: Negative for dizziness, tingling, sensory change, focal weakness, seizures, weakness and headaches.  Endo/Heme/Allergies: Negative for polydipsia. Does not bruise/bleed easily.  Psychiatric/Behavioral: Positive for  memory loss. Negative for depression. The patient is not nervous/anxious.     MEDICATIONS AT HOME:   Prior to Admission medications   Medication Sig Start Date End Date Taking? Authorizing Provider  aspirin EC 81 MG tablet Take 81 mg by mouth every morning.    Yes Historical Provider, MD  buPROPion (ZYBAN) 150 MG 12 hr tablet Take 1 tablet (150 mg total) by mouth 2 (two) times daily. 06/13/16  Yes Leone Haven, MD  colestipol (COLESTID) 1 g tablet Take 2 g by mouth daily.  09/16/15  Yes Historical Provider, MD  diphenoxylate-atropine (LOMOTIL) 2.5-0.025 MG tablet Take 1-2 tablets by mouth 2 (two) times daily. Take 2 tablets in the morning, and 1 tablet in the evening 10/06/15  Yes Historical Provider, MD  divalproex (DEPAKOTE ER) 250 MG 24 hr tablet Take 250 mg by mouth 2 (two) times daily. Pt takes 250mg  in the morning, and 250mg  in the afternoon   Yes Historical Provider, MD  donepezil (ARICEPT) 10 MG tablet Take 10 mg by mouth at bedtime.   Yes Historical Provider, MD  finasteride (PROSCAR) 5 MG tablet Take 1 tablet (5 mg total) by mouth daily. 10/07/15  Yes Nickie Retort, MD  gabapentin (NEURONTIN) 600 MG tablet Take 2 tablets (1,200 mg total) by mouth 3 (three) times daily. 05/16/16  Yes Leone Haven, MD  glimepiride (AMARYL) 4 MG tablet Take 1 tablet (4 mg total) by mouth 2 (two) times daily. 07/16/16  Yes Leone Haven, MD  lisinopril-hydrochlorothiazide (PRINZIDE,ZESTORETIC) 20-25 MG tablet Take 1 tablet by mouth 2 (two) times daily. 06/13/16  Yes Leone Haven, MD  memantine (NAMENDA) 10 MG tablet Take 10 mg by mouth 2 (two) times daily.  05/02/16  Yes Historical Provider, MD  metFORMIN (GLUCOPHAGE) 500 MG tablet TAKE TWO TABLETS BY MOUTH TWICE A DAY WITH A MEAL 08/06/16  Yes Leone Haven, MD  metoprolol succinate (TOPROL-XL) 25 MG 24 hr tablet Take 25 mg by mouth daily.  04/23/16  Yes Historical Provider, MD  omeprazole (PRILOSEC) 20 MG capsule 20 mg 2 (two) times  daily before a meal.  04/01/16  Yes Historical Provider, MD  oxyCODONE (OXY IR/ROXICODONE) 5 MG immediate release tablet Take 1 tablet (5 mg total) by mouth every 6 (six) hours as needed for severe pain. 06/29/16  Yes Leone Haven, MD  oxyCODONE-acetaminophen (PERCOCET) 7.5-325 MG tablet Take 1 tablet by mouth 3 (three) times daily. 08/06/16  Yes Molli Barrows, MD  primidone (MYSOLINE) 50 MG tablet TAKE 5 TABLETS IN THE MORNING AND 3 TABLETS IN THE EVENING 02/06/16  Yes Historical Provider, MD  propranolol (INDERAL) 60 MG tablet Take 60 mg by mouth 2 (two) times daily.    Yes Historical Provider, MD  QUEtiapine (SEROQUEL) 25 MG tablet Take 75 mg by mouth at bedtime.  07/27/15  Yes Historical Provider, MD  simvastatin (ZOCOR) 40 MG tablet Take 1 tablet (40 mg total) by mouth daily. 04/09/16  Yes Angela Adam  Caryl Bis, MD  sitaGLIPtin (JANUVIA) 100 MG tablet Take 1 tablet (100 mg total) by mouth daily. 06/15/16  Yes Leone Haven, MD  tamsulosin (FLOMAX) 0.4 MG CAPS capsule Take 0.4 mg by mouth daily after supper.  09/15/15  Yes Historical Provider, MD  acetaminophen (TYLENOL) 500 MG tablet Take 1,000 mg by mouth as needed.     Historical Provider, MD  clorazepate (TRANXENE) 3.75 MG tablet Take 1 tablet (3.75 mg total) by mouth at bedtime as needed for anxiety. 12/09/15   Leone Haven, MD  cyclobenzaprine (FLEXERIL) 10 MG tablet Take 10 mg by mouth 3 (three) times daily as needed for muscle spasms.    Historical Provider, MD  fenofibrate (TRICOR) 145 MG tablet TAKE ONE TABLET BY MOUTH DAILY 08/09/16   Leone Haven, MD      VITAL SIGNS:  Blood pressure 134/69, pulse (!) 110, temperature 98.6 F (37 C), resp. rate (!) 22, height 5\' 11"  (1.803 m), weight 90.7 kg (200 lb), SpO2 92 %.  PHYSICAL EXAMINATION:  Physical Exam  GENERAL:  76 y.o.-year-old patient lying in the bed lethargic and encephaloapathic.  EYES: Pupils equal, round, reactive to light and accommodation. No scleral icterus.  Extraocular muscles intact.  HEENT: Head atraumatic, normocephalic. Oropharynx and nasopharynx clear. No oropharyngeal erythema, dry oral mucosa  NECK:  Supple, no jugular venous distention. No thyroid enlargement, no tenderness.  LUNGS: Normal breath sounds bilaterally, no wheezing, rales, rhonchi. No use of accessory muscles of respiration.  CARDIOVASCULAR: S1, S2 RRR. No murmurs, rubs, gallops, clicks.  ABDOMEN: Soft, nontender, nondistended. Bowel sounds present. No organomegaly or mass.  EXTREMITIES: No pedal edema, cyanosis, or clubbing. + 2 pedal & radial pulses b/l.   NEUROLOGIC: Cranial nerves II through XII are intact. No focal Motor or sensory deficits appreciated b/l. Globally weak.  PSYCHIATRIC: The patient is alert and oriented x 1.  SKIN: No obvious rash, lesion, or ulcer.   LABORATORY PANEL:   CBC  Recent Labs Lab 08/09/16 1211  WBC 8.7  HGB 11.9*  HCT 34.7*  PLT 259   ------------------------------------------------------------------------------------------------------------------  Chemistries   Recent Labs Lab 08/09/16 1211  NA 132*  K 4.2  CL 93*  CO2 26  GLUCOSE 125*  BUN 38*  CREATININE 1.32*  CALCIUM 8.8*  AST 93*  ALT 49  ALKPHOS 28*  BILITOT 0.6   ------------------------------------------------------------------------------------------------------------------  Cardiac Enzymes No results for input(s): TROPONINI in the last 168 hours. ------------------------------------------------------------------------------------------------------------------  RADIOLOGY:  Dg Chest 2 View  Result Date: 08/09/2016 CLINICAL DATA:  Altered mental status. EXAM: CHEST  2 VIEW COMPARISON:  Radiographs of May 29, 2016. FINDINGS: Stable cardiomediastinal silhouette. Old left rib fractures are noted. No pneumothorax or pleural effusion is noted. No acute pulmonary disease is noted. IMPRESSION: No active cardiopulmonary disease. Electronically Signed   By: Marijo Conception, M.D.   On: 08/09/2016 12:25   Ct Head Wo Contrast  Result Date: 08/09/2016 CLINICAL DATA:  Altered mental status. Slurred speech. Incontinence. EXAM: CT HEAD WITHOUT CONTRAST TECHNIQUE: Contiguous axial images were obtained from the base of the skull through the vertex without intravenous contrast. COMPARISON:  Brain MR of 05/27/2015.  CT of 04/15/2008. FINDINGS: Brain: Mild low density in the periventricular white matter likely related to small vessel disease. Mild ventriculomegaly is similar and likely related to cerebral atrophy. No hemorrhage, acute infarct, mass lesion, intra-axial, or extra-axial fluid collection. Vascular: Intracranial atherosclerosis. Skull: No significant soft tissue swelling.  No skull fracture. Sinuses/Orbits: Normal imaged portions of  the orbits and globes. Mucosal thickening of bilateral maxillary sinuses and ethmoid air cells. Clear mastoid air cells. Other: None. IMPRESSION: 1.  No acute intracranial abnormality. 2.  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 3. Sinus disease. Electronically Signed   By: Abigail Miyamoto M.D.   On: 08/09/2016 12:38     IMPRESSION AND PLAN:   76 y.o. male with a known history of Dementia, anxiety/depression, diabetes, GERD, history of alcohol abuse, hypertension, hyperlipidemia, neuropathy, essential tremor who presents to the hospital due to altered mental status and being found down at home.  1. Altered mental status/encephalopathy-secondary to influenza and dehydration. -CT head negative for any acute pathology. Urinalysis negative for UTI. Await Ammonia level, TSH.  - We'll treat the patient with IV fluids, Tamiflu and follow mental status. -If not improving within 24 hours would consider MRI of the brain and also neurology consult.  2. Flu - pt. Is + for influenza B by PCR - droplet Precautions, Tamiflu - duonebs PRN  3. Acute Rhabdomyolysis - due to fall last night and being on floor for prolonged period of  time.  - will hydrate with IV fluids and follow CK's.    4. Diabetes type 2 without complication-no evidence of hypoglycemia. -We'll place on sliding scale insulin for now.  5. History of essential tremor-continue propranolol.  6. Chronic back pain-continue as needed Percocet.  7. Dementia without behavioral disturbance-continue Aricept, Namenda, Seroquel, Depakote  8. Essential hypertension-continue Toprol.  9. BPH - cont. Finasteride.    10. Depression - cont. Wellbutrin.    All the records are reviewed and case discussed with ED provider. Management plans discussed with the patient, family and they are in agreement.  CODE STATUS: Full Code  TOTAL TIME TAKING CARE OF THIS PATIENT: 45 minutes.    Henreitta Leber M.D on 08/09/2016 at 2:33 PM  Between 7am to 6pm - Pager - [phone]  After 6pm go to [email or web address] - password [email or web address]  [place] [facility]  Office  [phone]  CC: Primary care physician; Tommi Rumps, MD

## 2016-08-09 NOTE — Progress Notes (Signed)
ANTIBIOTIC CONSULT NOTE - INITIAL  Pharmacy Consult for Tamiflu Indication: flu  Allergies  Allergen Reactions  . No Known Allergies     Patient Measurements: Height: 5\' 11"  (180.3 cm) Weight: 200 lb (90.7 kg) IBW/kg (Calculated) : 75.3 Adjusted Body Weight:   Vital Signs: Temp: 98.6 F (37 C) (03/22 1426) Temp Source: Oral (03/22 1106) BP: 143/75 (03/22 1500) Pulse Rate: 109 (03/22 1500) Intake/Output from previous day: No intake/output data recorded. Intake/Output from this shift: No intake/output data recorded.  Labs:  Recent Labs  08/09/16 1211  WBC 8.7  HGB 11.9*  PLT 259  CREATININE 1.32*   Estimated Creatinine Clearance: 54.9 mL/min (A) (by C-G formula based on SCr of 1.32 mg/dL (H)). No results for input(s): VANCOTROUGH, VANCOPEAK, VANCORANDOM, GENTTROUGH, GENTPEAK, GENTRANDOM, TOBRATROUGH, TOBRAPEAK, TOBRARND, AMIKACINPEAK, AMIKACINTROU, AMIKACIN in the last 72 hours.   Microbiology: No results found for this or any previous visit (from the past 720 hour(s)).  Medical History: Past Medical History:  Diagnosis Date  . Anxiety   . Anxiety and depression   . Arthritis   . Colon polyps   . Dementia   . Depression   . Diabetes mellitus type 2 in nonobese (HCC)   . GERD (gastroesophageal reflux disease)   . Headache   . 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[place])   . History of hiatal hernia   . Hypercholesteremia   . Hypertension   . Hyperthyroidism   . Neuropathic pain   . Primary localized osteoarthritis of right knee   . Stomach ulcer   . Tremor, essential     Medications:  Facility-Administered Medications Prior to Admission  Medication Dose Route Frequency Provider Last Rate Last Dose  . iopamidol (ISOVUE-M) 41 % intrathecal injection 20 mL  20 mL Other Once PRN Molli Barrows, MD      . lactated ringers infusion 1,000 mL  1,000 mL Intravenous Continuous Molli Barrows, MD 125 mL/hr at 06/14/16 1355 1,000 mL at 06/14/16 1355   Prescriptions Prior to  Admission  Medication Sig Dispense Refill Last Dose  . aspirin EC 81 MG tablet Take 81 mg by mouth every morning.    08/08/2016 at 0800  . buPROPion (ZYBAN) 150 MG 12 hr tablet Take 1 tablet (150 mg total) by mouth 2 (two) times daily. 180 tablet 1 08/08/2016 at 0800  . colestipol (COLESTID) 1 g tablet Take 2 g by mouth daily.    08/08/2016 at 0800  . diphenoxylate-atropine (LOMOTIL) 2.5-0.025 MG tablet Take 1-2 tablets by mouth 2 (two) times daily. Take 2 tablets in the morning, and 1 tablet in the evening   08/08/2016 at 1800  . divalproex (DEPAKOTE ER) 250 MG 24 hr tablet Take 250 mg by mouth 2 (two) times daily. Pt takes 250mg  in the morning, and 250mg  in the afternoon   08/08/2016 at 1800  . donepezil (ARICEPT) 10 MG tablet Take 10 mg by mouth at bedtime.   08/08/2016 at 2100  . finasteride (PROSCAR) 5 MG tablet Take 1 tablet (5 mg total) by mouth daily. 30 tablet 11 08/08/2016 at 0800  . gabapentin (NEURONTIN) 600 MG tablet Take 2 tablets (1,200 mg total) by mouth 3 (three) times daily. 180 tablet 3 08/08/2016 at 2100  . glimepiride (AMARYL) 4 MG tablet Take 1 tablet (4 mg total) by mouth 2 (two) times daily. 180 tablet 3 08/08/2016 at 0800  . lisinopril-hydrochlorothiazide (PRINZIDE,ZESTORETIC) 20-25 MG tablet Take 1 tablet by mouth 2 (two) times daily. 180 tablet 2 08/08/2016 at 1800  .  memantine (NAMENDA) 10 MG tablet Take 10 mg by mouth 2 (two) times daily.    08/08/2016 at 0800  . metFORMIN (GLUCOPHAGE) 500 MG tablet TAKE TWO TABLETS BY MOUTH TWICE A DAY WITH A MEAL 180 tablet 2 08/08/2016 at 1800  . metoprolol succinate (TOPROL-XL) 25 MG 24 hr tablet Take 25 mg by mouth daily.    08/08/2016 at 0800  . omeprazole (PRILOSEC) 20 MG capsule 20 mg 2 (two) times daily before a meal.    08/08/2016 at 0800  . oxyCODONE (OXY IR/ROXICODONE) 5 MG immediate release tablet Take 1 tablet (5 mg total) by mouth every 6 (six) hours as needed for severe pain. 45 tablet 0 08/08/2016 at 0800  . oxyCODONE-acetaminophen  (PERCOCET) 7.5-325 MG tablet Take 1 tablet by mouth 3 (three) times daily. 90 tablet 0 08/08/2016 at 1800  . primidone (MYSOLINE) 50 MG tablet TAKE 5 TABLETS IN THE MORNING AND 3 TABLETS IN THE EVENING   08/08/2016 at 1800  . propranolol (INDERAL) 60 MG tablet Take 60 mg by mouth 2 (two) times daily.    08/08/2016 at 0800  . QUEtiapine (SEROQUEL) 25 MG tablet Take 75 mg by mouth at bedtime.    08/08/2016 at 2000  . simvastatin (ZOCOR) 40 MG tablet Take 1 tablet (40 mg total) by mouth daily. 90 tablet 1 08/08/2016 at 0800  . sitaGLIPtin (JANUVIA) 100 MG tablet Take 1 tablet (100 mg total) by mouth daily. 30 tablet 3 08/08/2016 at 0800  . tamsulosin (FLOMAX) 0.4 MG CAPS capsule Take 0.4 mg by mouth daily after supper.    08/08/2016 at 0800  . acetaminophen (TYLENOL) 500 MG tablet Take 1,000 mg by mouth as needed.    prn at prn  . clorazepate (TRANXENE) 3.75 MG tablet Take 1 tablet (3.75 mg total) by mouth at bedtime as needed for anxiety. 30 tablet 0 prn at prn  . cyclobenzaprine (FLEXERIL) 10 MG tablet Take 10 mg by mouth 3 (three) times daily as needed for muscle spasms.   prn at prn  . fenofibrate (TRICOR) 145 MG tablet TAKE ONE TABLET BY MOUTH DAILY 90 tablet 0    Assessment: CrCl = 54.9 ml/min   Goal of Therapy:  resolution of infection  Plan:  Expected duration 5 days with resolution of temperature and/or normalization of WBC   Tamiflu 75 mg PO X 1 given on 3/22 in AM.  Will start tamiflu 30 mg PO Q12H X 5 days on 3/22 @ 22:00.   Raychell Holcomb D 08/09/2016,3:37 PM

## 2016-08-10 ENCOUNTER — Ambulatory Visit: Payer: PPO

## 2016-08-10 ENCOUNTER — Telehealth: Payer: Self-pay | Admitting: Family Medicine

## 2016-08-10 LAB — GLUCOSE, CAPILLARY
Glucose-Capillary: 125 mg/dL — ABNORMAL HIGH (ref 65–99)
Glucose-Capillary: 132 mg/dL — ABNORMAL HIGH (ref 65–99)

## 2016-08-10 LAB — CBC
HCT: 31.2 % — ABNORMAL LOW (ref 40.0–52.0)
Hemoglobin: 10.8 g/dL — ABNORMAL LOW (ref 13.0–18.0)
MCH: 31.9 pg (ref 26.0–34.0)
MCHC: 34.6 g/dL (ref 32.0–36.0)
MCV: 92.2 fL (ref 80.0–100.0)
Platelets: 219 10*3/uL (ref 150–440)
RBC: 3.39 MIL/uL — ABNORMAL LOW (ref 4.40–5.90)
RDW: 14 % (ref 11.5–14.5)
WBC: 6.4 10*3/uL (ref 3.8–10.6)

## 2016-08-10 LAB — BASIC METABOLIC PANEL
Anion gap: 9 (ref 5–15)
BUN: 26 mg/dL — ABNORMAL HIGH (ref 6–20)
CO2: 26 mmol/L (ref 22–32)
Calcium: 8 mg/dL — ABNORMAL LOW (ref 8.9–10.3)
Chloride: 96 mmol/L — ABNORMAL LOW (ref 101–111)
Creatinine, Ser: 1.16 mg/dL (ref 0.61–1.24)
GFR calc Af Amer: >60 mL/min (ref >60)
GFR calc non Af Amer: 59 mL/min — ABNORMAL LOW (ref >60)
Glucose, Bld: 90 mg/dL (ref 65–99)
Potassium: 3.6 mmol/L (ref 3.5–5.1)
Sodium: 131 mmol/L — ABNORMAL LOW (ref 135–145)

## 2016-08-10 LAB — CK: Total CK: 3089 U/L — ABNORMAL HIGH (ref 49–397)

## 2016-08-10 LAB — GLUCOSE, POCT (MANUAL RESULT ENTRY): POC Glucose: 132 mg/dl — AB (ref 70–99)

## 2016-08-10 NOTE — Progress Notes (Signed)
[facility] [place] NAME: James Hood    MR#:  [id number]  DATE OF BIRTH:  1940-07-19  SUBJECTIVE:   Pt. Here due to AMS and noted to have the Flu and to have acute rhabdomyolysis. Mental status improved today. Ck's trending down.   REVIEW OF SYSTEMS:    Review of Systems  Constitutional: Negative for chills and fever.  HENT: Negative for congestion and tinnitus.   Eyes: Negative for blurred vision and double vision.  Respiratory: Negative for cough, shortness of breath and wheezing.   Cardiovascular: Negative for chest pain, orthopnea and PND.  Gastrointestinal: Negative for abdominal pain, diarrhea, nausea and vomiting.  Genitourinary: Negative for dysuria and hematuria.  Neurological: Positive for weakness. Negative for dizziness, sensory change and focal weakness.  All other systems reviewed and are negative.   Nutrition: Heart Healthy/Carb control Tolerating Diet: Yes Tolerating PT: Await Eval.   DRUG ALLERGIES:   Allergies  Allergen Reactions  . No Known Allergies     VITALS:  Blood pressure 125/64, pulse 90, temperature 98.3 F (36.8 C), resp. rate 20, height 5\' 11"  (1.803 m), weight 87.3 kg (192 lb 6.4 oz), SpO2 93 %.  PHYSICAL EXAMINATION:   Physical Exam  GENERAL:  76 y.o.-year-old patient lying in the bed in no acute distress.  EYES: Pupils equal, round, reactive to light and accommodation. No scleral icterus. Extraocular muscles intact.  HEENT: Head atraumatic, normocephalic. Oropharynx and nasopharynx clear.  NECK:  Supple, no jugular venous distention. No thyroid enlargement, no tenderness.  LUNGS: Normal breath sounds bilaterally, no wheezing, rales, rhonchi. No use of accessory muscles of respiration.  CARDIOVASCULAR: S1, S2 normal. No murmurs, rubs, or gallops.  ABDOMEN: Soft, nontender, nondistended. Bowel sounds present. No organomegaly or mass.  EXTREMITIES: No cyanosis, clubbing or edema b/l.     NEUROLOGIC: Cranial nerves II through XII are intact. No focal Motor or sensory deficits b/l. Globally weak. PSYCHIATRIC: The patient is alert and oriented x 2. SKIN: No obvious rash, lesion, or ulcer.    LABORATORY PANEL:   CBC  Recent Labs Lab 08/10/16 0357  WBC 6.4  HGB 10.8*  HCT 31.2*  PLT 219   ------------------------------------------------------------------------------------------------------------------  Chemistries   Recent Labs Lab 08/09/16 1211 08/10/16 0357  NA 132* 131*  K 4.2 3.6  CL 93* 96*  CO2 26 26  GLUCOSE 125* 90  BUN 38* 26*  CREATININE 1.32* 1.16  CALCIUM 8.8* 8.0*  AST 93*  --   ALT 49  --   ALKPHOS 28*  --   BILITOT 0.6  --    ------------------------------------------------------------------------------------------------------------------  Cardiac Enzymes No results for input(s): TROPONINI in the last 168 hours. ------------------------------------------------------------------------------------------------------------------  RADIOLOGY:  Dg Chest 2 View  Result Date: 08/09/2016 CLINICAL DATA:  Altered mental status. EXAM: CHEST  2 VIEW COMPARISON:  Radiographs of May 29, 2016. FINDINGS: Stable cardiomediastinal silhouette. Old left rib fractures are noted. No pneumothorax or pleural effusion is noted. No acute pulmonary disease is noted. IMPRESSION: No active cardiopulmonary disease. Electronically Signed   By: Marijo Conception, M.D.   On: 08/09/2016 12:25   Ct Head Wo Contrast  Result Date: 08/09/2016 CLINICAL DATA:  Altered mental status. Slurred speech. Incontinence. EXAM: CT HEAD WITHOUT CONTRAST TECHNIQUE: Contiguous axial images were obtained from the base of the skull through the vertex without intravenous contrast. COMPARISON:  Brain MR of 05/27/2015.  CT of 04/15/2008. FINDINGS: Brain: Mild low density in the periventricular white matter likely related  to small vessel disease. Mild ventriculomegaly is similar and likely  related to cerebral atrophy. No hemorrhage, acute infarct, mass lesion, intra-axial, or extra-axial fluid collection. Vascular: Intracranial atherosclerosis. Skull: No significant soft tissue swelling.  No skull fracture. Sinuses/Orbits: Normal imaged portions of the orbits and globes. Mucosal thickening of bilateral maxillary sinuses and ethmoid air cells. Clear mastoid air cells. Other: None. IMPRESSION: 1.  No acute intracranial abnormality. 2.  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 3. Sinus disease. Electronically Signed   By: Abigail Miyamoto M.D.   On: 08/09/2016 12:38     ASSESSMENT AND PLAN:   76 y.o. male with a known history of Dementia, anxiety/depression, diabetes, GERD, history of alcohol abuse, hypertension, hyperlipidemia, neuropathy, essential tremor who presents to the hospital due to altered mental status and being found down at home.  1. Altered mental status/encephalopathy-secondary to influenza and dehydration. -CT head negative for any acute pathology. Urinalysis negative for UTI.  Ammonia, TSH level within range.   - Mental status much improved with IV fluids and after being treated with Tamiflu.  - cont. IV fluids and hold off on Neuro consult for now  2. Flu - pt. Is + for influenza B by PCR - cont. droplet Precautions, Tamiflu - duonebs PRN. Improving.   3. Acute Rhabdomyolysis - Traumatic and due to recent fall.  - cont. IV fluids and CK's trending down and will monitor.    4. Diabetes type 2 without complication-no evidence of hypoglycemia. - cont. SSI for now.   5. History of essential tremor-continue propranolol.  6. Chronic back pain-continue as needed Percocet.  7. Dementia without behavioral disturbance-continue Aricept, Namenda, Seroquel, Depakote  8. Essential hypertension-continue Toprol.  9. BPH - cont. Finasteride.    10. Depression - cont. Wellbutrin.    Will get PT eval.   All the records are reviewed and case discussed  with Care Management/Social Worker. Management plans discussed with the patient, family and they are in agreement.  CODE STATUS: Full code  DVT Prophylaxis: Lovenox  TOTAL TIME TAKING CARE OF THIS PATIENT: 30 minutes.   POSSIBLE D/C IN 1-2 DAYS, DEPENDING ON CLINICAL CONDITION.   Henreitta Leber M.D on 08/10/2016 at 1:18 PM  Between 7am to 6pm - Pager - [phone]  After 6pm go to [email or web address] - Proofreader  [facility]  Office  [phone]  CC: Primary care physician; Tommi Rumps, MD

## 2016-08-10 NOTE — Telephone Encounter (Signed)
Pt spouse called and stated that pt missed appt this morning because he was admitted to the hospital last night. Pt has the flu and passed out at home. [phone]

## 2016-08-10 NOTE — Telephone Encounter (Signed)
Thank you. Patient was removed from the schedule and will not be charged.

## 2016-08-10 NOTE — Plan of Care (Signed)
Problem: Education: Goal: Knowledge of [facility] Education information/materials will improve Outcome: Progressing POC reviewed with pt and wife, all questions answered.   Problem: Safety: Goal: Ability to remain free from injury will improve Outcome: Progressing Pt remains free from falls, bed alarm set.   Problem: Pain Managment: Goal: General experience of comfort will improve Outcome: Progressing Pt denies need for prn pain meds, wife refused scheduled pain meds d/t drowsiness.   Problem: Physical Regulation: Goal: Ability to maintain clinical measurements within normal limits will improve Outcome: Progressing Pt oob to bsc with 1 person assist.  Goal: Will remain free from infection Outcome: Progressing Pt afebrile, will cont to monitor WBC.  Problem: Activity: Goal: Risk for activity intolerance will decrease Outcome: Progressing Pt oob to BSC.  Problem: Bowel/Gastric: Goal: Will not experience complications related to bowel motility Outcome: Progressing Pt with bm x 2, loose stools.

## 2016-08-11 LAB — GLUCOSE, POCT (MANUAL RESULT ENTRY)
POC Glucose: 106 mg/dl — AB (ref 70–99)
POC Glucose: 156 mg/dl — AB (ref 70–99)
POC Glucose: 151 mg/dl — AB (ref 70–99)
POC Glucose: 170 mg/dl — AB (ref 70–99)

## 2016-08-11 LAB — CK: Total CK: 1673 U/L — ABNORMAL HIGH (ref 49–397)

## 2016-08-11 MED ORDER — IPRATROPIUM-ALBUTEROL 0.5-2.5 (3) MG/3ML IN SOLN
3.0000 mL | Freq: Four times a day (QID) | RESPIRATORY_TRACT | Status: DC
  Administered 2016-08-11 – 2016-08-12 (×4): 3 mL via RESPIRATORY_TRACT
  Filled 2016-08-11 (×5): qty 3

## 2016-08-11 MED ORDER — BUDESONIDE 0.5 MG/2ML IN SUSP
0.5000 mg | Freq: Two times a day (BID) | RESPIRATORY_TRACT | Status: DC
  Administered 2016-08-11 – 2016-08-12 (×2): 0.5 mg via RESPIRATORY_TRACT
  Filled 2016-08-11 (×2): qty 2

## 2016-08-11 NOTE — Progress Notes (Signed)
[facility] NAME: James [facility]    MR#:  [id number]  DATE OF BIRTH:  March 23, 1941  SUBJECTIVE:   Pt. Here due to AMS and noted to have the Flu and to have acute rhabdomyolysis. Mental status much improved since admission.  CK's trending down.  Having some cough, wheezing, bronchospasm today.   REVIEW OF SYSTEMS:    Review of Systems  Constitutional: Negative for chills and fever.  HENT: Negative for congestion and tinnitus.   Eyes: Negative for blurred vision and double vision.  Respiratory: Positive for cough and wheezing. Negative for shortness of breath.   Cardiovascular: Negative for chest pain, orthopnea and PND.  Gastrointestinal: Negative for abdominal pain, diarrhea, nausea and vomiting.  Genitourinary: Negative for dysuria and hematuria.  Neurological: Positive for weakness. Negative for dizziness, sensory change and focal weakness.  All other systems reviewed and are negative.   Nutrition: Heart Healthy/Carb control Tolerating Diet: Yes Tolerating PT: Await Eval.   DRUG ALLERGIES:   Allergies  Allergen Reactions  . No Known Allergies     VITALS:  Blood pressure (!) 113/54, pulse 71, temperature 100.1 F (37.8 C), temperature source Oral, resp. rate 20, height 5\' 11"  (1.803 m), weight 87.3 kg (192 lb 6.4 oz), SpO2 95 %.  PHYSICAL EXAMINATION:   Physical Exam  GENERAL:  76 y.o.-year-old patient lying in the bed in no acute distress.  EYES: Pupils equal, round, reactive to light and accommodation. No scleral icterus. Extraocular muscles intact.  HEENT: Head atraumatic, normocephalic. Oropharynx and nasopharynx clear.  NECK:  Supple, no jugular venous distention. No thyroid enlargement, no tenderness.  LUNGS: Normal breath sounds bilaterally, insp & exp wheezing b/l, No rales, rhonchi. No use of accessory muscles of respiration.  CARDIOVASCULAR: S1, S2 normal. No murmurs, rubs, or gallops.  ABDOMEN: Soft,  nontender, nondistended. Bowel sounds present. No organomegaly or mass.  EXTREMITIES: No cyanosis, clubbing or edema b/l.    NEUROLOGIC: Cranial nerves II through XII are intact. No focal Motor or sensory deficits b/l. Globally weak. PSYCHIATRIC: The patient is alert and oriented x 2. SKIN: No obvious rash, lesion, or ulcer.    LABORATORY PANEL:   CBC  Recent Labs Lab 08/10/16 0357  WBC 6.4  HGB 10.8*  HCT 31.2*  PLT 219   ------------------------------------------------------------------------------------------------------------------  Chemistries   Recent Labs Lab 08/09/16 1211 08/10/16 0357  NA 132* 131*  K 4.2 3.6  CL 93* 96*  CO2 26 26  GLUCOSE 125* 90  BUN 38* 26*  CREATININE 1.32* 1.16  CALCIUM 8.8* 8.0*  AST 93*  --   ALT 49  --   ALKPHOS 28*  --   BILITOT 0.6  --    ------------------------------------------------------------------------------------------------------------------  Cardiac Enzymes No results for input(s): TROPONINI in the last 168 hours. ------------------------------------------------------------------------------------------------------------------  RADIOLOGY:  No results found.   ASSESSMENT AND PLAN:   76 y.o. male with a known history of Dementia, anxiety/depression, diabetes, GERD, history of alcohol abuse, hypertension, hyperlipidemia, neuropathy, essential tremor who presents to the hospital due to altered mental status and being found down at home.  1. Altered mental status/encephalopathy-secondary to influenza and dehydration. -CT head negative for any acute pathology. Urinalysis negative for UTI.  Ammonia, TSH level within range.   - Mental status much improved since admission w/ IV fluids, Tamiflu  2. Flu - pt. Is + for influenza B by PCR - pt. Having some wheezing, bronchospasm today. Will add some scheduled duonebs and Pulmicort nebs.  -  cont. droplet Precautions, Tamiflu  3. Acute Rhabdomyolysis - Traumatic and due  to recent fall.  - cont. IV fluids and CK's down to 1600 today.   4. Diabetes type 2 without complication-no evidence of hypoglycemia. - cont. SSI for now.   5. History of essential tremor-continue propranolol.  6. Chronic back pain-continue as needed Percocet.  7. Dementia without behavioral disturbance-continue Aricept, Namenda, Seroquel, Depakote  8. Essential hypertension-continue Toprol.  9. BPH - cont. Finasteride.    10. Depression - cont. Wellbutrin.    Await PT Eval.   Discussed plan of care with pt's wife over phone  All the records are reviewed and case discussed with Care Management/Social Worker. Management plans discussed with the patient, family and they are in agreement.  CODE STATUS: Full code  DVT Prophylaxis: Lovenox  TOTAL TIME TAKING CARE OF THIS PATIENT: 30 minutes.   POSSIBLE D/C IN 1-2 DAYS, DEPENDING ON CLINICAL CONDITION.   James Hood M.D on 08/11/2016 at 12:33 PM  Between 7am to 6pm - Pager - [phone]  After 6pm go to [email or web address] - Proofreader  [facility]  Office  [phone]  CC: Primary care physician; James Rumps, MD

## 2016-08-11 NOTE — Progress Notes (Signed)
Physical Therapy Evaluation Patient Details Name: James Hood MRN: [id number] DOB: 04-14-1941 Today's Date: 08/11/2016   History of Present Illness  Patient is a 76 y.o. male admitted on 22 March with influenza/acute rhabdomyolysis. Patient found on floor next to bed at home. PMH includes dementia, anxiety/depression, GERD, DMII, alcohol abuse, HTN, HLD, neuropathy, and essential tremor.  Clinical Impression  Patient is a pleasant male admitted for above listed reasons. Upon evaluation, patient demonstrates independence in bed mobility, transfers, and gait. Patient does ambulate with wide BOS, showing slight unsteadiness with SLS/dynamic balance assessment. PT encouraged patient to practice balance activities to prevent fall/need for assistive devices in the future. Patient expressed understanding. Patient is otherwise at baseline level of function and safe to d/c home when medically ready.    Follow Up Recommendations No PT follow up    Equipment Recommendations  None recommended by PT    Recommendations for Other Services       Precautions / Restrictions Precautions Precautions: Fall Restrictions Weight Bearing Restrictions: No      Mobility  Bed Mobility Overal bed mobility: Independent             General bed mobility comments: Patient performs bed mobility independently.  Transfers Overall transfer level: Modified independent Equipment used: None             General transfer comment: Patient moves from sit to stand and stand to sit with good safety awareness and no AD.  Ambulation/Gait Ambulation/Gait assistance: Min assist Ambulation Distance (Feet): 30 Feet Assistive device: None       General Gait Details: Patient ambulates at decreased cadence with wide BOS and mild unsteadiness.  Stairs            Wheelchair Mobility    Modified Rankin (Stroke Patients Only)       Balance Overall balance assessment: Modified Independent;History  of Falls                                           Pertinent Vitals/Pain Pain Assessment: No/denies pain    Home Living Family/patient expects to be discharged to:: Private residence Living Arrangements: Spouse/significant other Available Help at Discharge: Family;Available PRN/intermittently Type of Home: House Home Access: Stairs to enter Entrance Stairs-Rails: Can reach both Entrance Stairs-Number of Steps: 14 (7 with landing followed by 7; bilateral rails) Home Layout: Multi-level Home Equipment: Walker - 2 wheels;Cane - single point      Prior Function Level of Independence: Independent         Comments: Patient previously independent.     Hand Dominance        Extremity/Trunk Assessment   Upper Extremity Assessment Upper Extremity Assessment: Overall WFL for tasks assessed    Lower Extremity Assessment Lower Extremity Assessment: Overall WFL for tasks assessed       Communication   Communication: No difficulties  Cognition Arousal/Alertness: Awake/alert Behavior During Therapy: WFL for tasks assessed/performed Overall Cognitive Status: Within Functional Limits for tasks assessed                                        General Comments      Exercises     Assessment/Plan    PT Assessment Patent does not need any further PT services  PT Problem List  PT Treatment Interventions      PT Goals (Current goals can be found in the Care Plan section)  Acute Rehab PT Goals Patient Stated Goal: "To get out of here" PT Goal Formulation: With patient Time For Goal Achievement: 08/25/16 Potential to Achieve Goals: Good    Frequency     Barriers to discharge        Co-evaluation               End of Session Equipment Utilized During Treatment: Gait belt Activity Tolerance: Patient tolerated treatment well Patient left: in chair;with call bell/phone within reach;with chair alarm set Nurse  Communication: Mobility status PT Visit Diagnosis: Unsteadiness on feet (R26.81)    Time: 1287-8676 PT Time Calculation (min) (ACUTE ONLY): 15 min   Charges:   PT Evaluation $PT Eval Low Complexity: 1 Procedure     PT G Codes:        Dorice Lamas, PT, DPT 08/11/2016, 2:26 PM

## 2016-08-11 NOTE — Plan of Care (Signed)
Problem: Safety: Goal: Ability to remain free from injury will improve Outcome: Progressing Pt. was able to get up and use the urinal with minimal assistance. Transferred to chair and back to bed x2 with 1 assist. Temperature was WDL during the shift.

## 2016-08-12 LAB — GLUCOSE, POCT (MANUAL RESULT ENTRY): POC Glucose: 118 mg/dl — AB (ref 70–99)

## 2016-08-12 LAB — CK: Total CK: 1019 U/L — ABNORMAL HIGH (ref 49–397)

## 2016-08-12 MED ORDER — ALBUTEROL SULFATE HFA 108 (90 BASE) MCG/ACT IN AERS: 2.0000 | INHALATION_SPRAY | Freq: Four times a day (QID) | RESPIRATORY_TRACT | 2 refills | Status: DC | PRN

## 2016-08-12 MED ORDER — OSELTAMIVIR PHOSPHATE 30 MG PO CAPS: 30.0000 mg | ORAL_CAPSULE | Freq: Two times a day (BID) | ORAL | 0 refills | Status: AC

## 2016-08-12 NOTE — Progress Notes (Signed)
MD order received to discharge pt home today; verbally reviewed AVS with pt and pt's spouse; gave RXs to pt; no questions voiced at this time; pt discharged via wheelchair by nursing to the visitor's entrance

## 2016-08-12 NOTE — Plan of Care (Signed)
Problem: Pain Managment: Goal: General experience of comfort will improve Outcome: Not Progressing Pt with continued complaints of left lower ankle and leg pain. Scheduled pain meds offering some relief.

## 2016-08-12 NOTE — Discharge Summary (Signed)
[facility] [place] NAME: James Hood    MR#:  [id number]  DATE OF BIRTH:  21-Mar-1941  DATE OF ADMISSION:  08/09/2016 ADMITTING PHYSICIAN: Henreitta Leber, MD  DATE OF DISCHARGE: 08/12/2016 11:28 AM  PRIMARY CARE PHYSICIAN: Tommi Rumps, MD    ADMISSION DIAGNOSIS:  Somnolence [R40.0]  DISCHARGE DIAGNOSIS:  Active Problems:   Flu   SECONDARY DIAGNOSIS:   Past Medical History:  Diagnosis Date  . Anxiety   . Anxiety and depression   . Arthritis   . Colon polyps   . Dementia   . Depression   . Diabetes mellitus type 2 in nonobese (HCC)   . GERD (gastroesophageal reflux disease)   . Headache   . 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[place])   . History of hiatal hernia   . Hypercholesteremia   . Hypertension   . Hyperthyroidism   . Neuropathic pain   . Primary localized osteoarthritis of right knee   . Stomach ulcer   . Tremor, essential     HOSPITAL COURSE:   76 y.o.malewith a known history of Dementia, anxiety/depression, diabetes, GERD, history of alcohol abuse, hypertension, hyperlipidemia, neuropathy, essential tremor who presents to the hospital due to altered mental status and being found down at home.  1. Altered mental status/encephalopathy-secondary to influenza and dehydration. -CT head negative for any acute pathology. Urinalysis negative for UTI.  Ammonia, TSH level within range.   -Patient was given IV fluids and given Tamiflu and his mental status has significantly improved and is back to baseline now.  2. Flu - pt. was + for influenza B by PCR -Improved with Tamiflu, DuoNeb nebs and Pulmicort nebs. Patient now being discharged on some Tamiflu, Albuterol inhaler as needed.   3. Acute Rhabdomyolysis - Traumatic and due to recent fall at home.  - She was given IV fluids and his CKs have since improved and trended down. He is clinically asymptomatic now.  4. Diabetes type 2 without complication-no evidence of  hypoglycemia. - while in the Hospital patient was on sliding scale insulin now being discharged on his Januvia, Amaryl.  5. History of essential tremor- he will continue propranolol.  6. Chronic back pain- he will continue as needed Percocet.  7. Dementia without behavioral disturbance- he will continue Aricept, Namenda, Seroquel, Depakote  8. Essential hypertension- he will continue Toprol.  9. BPH - he will cont. Finasteride.   10. Depression - he will cont. Wellbutrin.   DISCHARGE CONDITIONS:   Stable.   CONSULTS OBTAINED:    DRUG ALLERGIES:   Allergies  Allergen Reactions  . No Known Allergies     DISCHARGE MEDICATIONS:   Allergies as of 08/12/2016      Reactions   No Known Allergies       Medication List    TAKE these medications   acetaminophen 500 MG tablet Commonly known as:  TYLENOL Take 1,000 mg by mouth as needed.   albuterol 108 (90 Base) MCG/ACT inhaler Commonly known as:  PROVENTIL HFA;VENTOLIN HFA Inhale 2 puffs into the lungs every 6 (six) hours as needed for wheezing or shortness of breath.   aspirin EC 81 MG tablet Take 81 mg by mouth every morning.   buPROPion 150 MG 12 hr tablet Commonly known as:  ZYBAN Take 1 tablet (150 mg total) by mouth 2 (two) times daily.   clorazepate 3.75 MG tablet Commonly known as:  TRANXENE Take 1 tablet (3.75 mg total) by mouth at bedtime as  needed for anxiety.   colestipol 1 g tablet Commonly known as:  COLESTID Take 2 g by mouth daily.   cyclobenzaprine 10 MG tablet Commonly known as:  FLEXERIL Take 10 mg by mouth 3 (three) times daily as needed for muscle spasms.   diphenoxylate-atropine 2.5-0.025 MG tablet Commonly known as:  LOMOTIL Take 1-2 tablets by mouth 2 (two) times daily. Take 2 tablets in the morning, and 1 tablet in the evening   divalproex 250 MG 24 hr tablet Commonly known as:  DEPAKOTE ER Take 250 mg by mouth 2 (two) times daily. Pt takes 250mg  in the morning, and 250mg  in  the afternoon   donepezil 10 MG tablet Commonly known as:  ARICEPT Take 10 mg by mouth at bedtime.   fenofibrate 145 MG tablet Commonly known as:  TRICOR TAKE ONE TABLET BY MOUTH DAILY What changed:  See the new instructions.   finasteride 5 MG tablet Commonly known as:  PROSCAR Take 1 tablet (5 mg total) by mouth daily.   gabapentin 600 MG tablet Commonly known as:  NEURONTIN Take 2 tablets (1,200 mg total) by mouth 3 (three) times daily.   glimepiride 4 MG tablet Commonly known as:  AMARYL Take 1 tablet (4 mg total) by mouth 2 (two) times daily.   lisinopril-hydrochlorothiazide 20-25 MG tablet Commonly known as:  PRINZIDE,ZESTORETIC Take 1 tablet by mouth 2 (two) times daily.   memantine 10 MG tablet Commonly known as:  NAMENDA Take 10 mg by mouth 2 (two) times daily.   metFORMIN 500 MG tablet Commonly known as:  GLUCOPHAGE TAKE TWO TABLETS BY MOUTH TWICE A DAY WITH A MEAL   metoprolol succinate 25 MG 24 hr tablet Commonly known as:  TOPROL-XL Take 25 mg by mouth daily.   omeprazole 20 MG capsule Commonly known as:  PRILOSEC 20 mg 2 (two) times daily before a meal.   oseltamivir 30 MG capsule Commonly known as:  TAMIFLU Take 1 capsule (30 mg total) by mouth 2 (two) times daily.   oxyCODONE 5 MG immediate release tablet Commonly known as:  Oxy IR/ROXICODONE Take 1 tablet (5 mg total) by mouth every 6 (six) hours as needed for severe pain.   oxyCODONE-acetaminophen 7.5-325 MG tablet Commonly known as:  PERCOCET Take 1 tablet by mouth 3 (three) times daily.   primidone 50 MG tablet Commonly known as:  MYSOLINE TAKE 5 TABLETS IN THE MORNING AND 3 TABLETS IN THE EVENING   propranolol 60 MG tablet Commonly known as:  INDERAL Take 60 mg by mouth 2 (two) times daily.   QUEtiapine 25 MG tablet Commonly known as:  SEROQUEL Take 75 mg by mouth at bedtime.   simvastatin 40 MG tablet Commonly known as:  ZOCOR Take 1 tablet (40 mg total) by mouth daily.    sitaGLIPtin 100 MG tablet Commonly known as:  JANUVIA Take 1 tablet (100 mg total) by mouth daily.   tamsulosin 0.4 MG Caps capsule Commonly known as:  FLOMAX Take 0.4 mg by mouth daily after supper.         DISCHARGE INSTRUCTIONS:   DIET:  Cardiac diet  DISCHARGE CONDITION:  Stable  ACTIVITY:  Activity as tolerated  OXYGEN:  Home Oxygen: No.   Oxygen Delivery: room air  DISCHARGE LOCATION:  home   If you experience worsening of your admission symptoms, develop shortness of breath, life threatening emergency, suicidal or homicidal thoughts you must seek medical attention immediately by calling 911 or calling your MD immediately  if symptoms  less severe.  You Must read complete instructions/literature along with all the possible adverse reactions/side effects for all the Medicines you take and that have been prescribed to you. Take any new Medicines after you have completely understood and accpet all the possible adverse reactions/side effects.   Please note  You were cared for by a hospitalist during your hospital stay. If you have any questions about your discharge medications or the care you received while you were in the hospital after you are discharged, you can call the unit and asked to speak with the hospitalist on call if the hospitalist that took care of you is not available. Once you are discharged, your primary care physician will handle any further medical issues. Please note that NO REFILLS for any discharge medications will be authorized once you are discharged, as it is imperative that you return to your primary care physician (or establish a relationship with a primary care physician if you do not have one) for your aftercare needs so that they can reassess your need for medications and monitor your lab values.     Today   Mental status much improved.  CK's trending down. Wheezing/bronchospasm improved.   VITAL SIGNS:  Blood pressure 127/67, pulse 79,  temperature 98.3 F (36.8 C), temperature source Oral, resp. rate 18, height 5\' 11"  (1.803 m), weight 87.3 kg (192 lb 6.4 oz), SpO2 96 %.  I/O:   Intake/Output Summary (Last 24 hours) at 08/12/16 1437 Last data filed at 08/12/16 0940  Gross per 24 hour  Intake          5432.33 ml  Output             1500 ml  Net          3932.33 ml    PHYSICAL EXAMINATION:  GENERAL:  76 y.o.-year-old patient lying in the bed with no acute distress.  EYES: Pupils equal, round, reactive to light and accommodation. No scleral icterus. Extraocular muscles intact.  HEENT: Head atraumatic, normocephalic. Oropharynx and nasopharynx clear.  NECK:  Supple, no jugular venous distention. No thyroid enlargement, no tenderness.  LUNGS: Normal breath sounds bilaterally, no wheezing, rales,rhonchi. No use of accessory muscles of respiration.  CARDIOVASCULAR: S1, S2 normal. No murmurs, rubs, or gallops.  ABDOMEN: Soft, non-tender, non-distended. Bowel sounds present. No organomegaly or mass.  EXTREMITIES: No pedal edema, cyanosis, or clubbing.  NEUROLOGIC: Cranial nerves II through XII are intact. No focal motor or sensory defecits b/l.  PSYCHIATRIC: The patient is alert and oriented x 3. Good affect.  SKIN: No obvious rash, lesion, or ulcer.   DATA REVIEW:   CBC  Recent Labs Lab 08/10/16 0357  WBC 6.4  HGB 10.8*  HCT 31.2*  PLT 219    Chemistries   Recent Labs Lab 08/09/16 1211 08/10/16 0357  NA 132* 131*  K 4.2 3.6  CL 93* 96*  CO2 26 26  GLUCOSE 125* 90  BUN 38* 26*  CREATININE 1.32* 1.16  CALCIUM 8.8* 8.0*  AST 93*  --   ALT 49  --   ALKPHOS 28*  --   BILITOT 0.6  --     Cardiac Enzymes No results for input(s): TROPONINI in the last 168 hours.  Microbiology Results  Results for orders placed or performed during the hospital encounter of 01/25/16  Surgical pcr screen     Status: None   Collection Time: 01/25/16  9:21 AM  Result Value Ref Range Status   MRSA, PCR NEGATIVE NEGATIVE  Final   Staphylococcus aureus NEGATIVE NEGATIVE Final    Comment:        The Xpert SA Assay (FDA approved for NASAL specimens in patients over 86 years of age), is one component of a comprehensive surveillance program.  Test performance has been validated by [facility] for patients greater than or equal to 27 year old. It is not intended to diagnose infection nor to guide or monitor treatment.     RADIOLOGY:  No results found.    Management plans discussed with the patient, family and they are in agreement.  CODE STATUS:     Code Status Orders        Start     Ordered   08/09/16 1526  Full code  Continuous     08/09/16 1525    Code Status History    Date Active Date Inactive Code Status Order ID Comments User Context   02/03/2016  1:52 PM 02/03/2016 10:43 PM Full Code [id number]  Leeroy Cha, MD Inpatient   11/15/2014  3:53 PM 11/17/2014  4:28 PM Full Code [id number]  Matthew Saras, PA-C Inpatient    Advance Directive Documentation     Most Recent Value  Type of Advance Directive  Healthcare Power of Attorney  Pre-existing out of facility DNR order (yellow form or pink MOST form)  -  "MOST" Form in Place?  -      TOTAL TIME TAKING CARE OF THIS PATIENT: 40 minutes.    Henreitta Leber M.D on 08/12/2016 at 2:37 PM  Between 7am to 6pm - Pager - [phone]  After 6pm go to [email or web address] - Proofreader  [facility] [employer]  Office  [phone]  CC: Primary care physician; Tommi Rumps, MD

## 2016-08-13 ENCOUNTER — Telehealth: Payer: Self-pay | Admitting: *Deleted

## 2016-08-13 ENCOUNTER — Telehealth: Payer: Self-pay | Admitting: Family Medicine

## 2016-08-13 LAB — GLUCOSE, CAPILLARY
Glucose-Capillary: 137 mg/dL — ABNORMAL HIGH (ref 65–99)
Glucose-Capillary: 168 mg/dL — ABNORMAL HIGH (ref 65–99)
Glucose-Capillary: 106 mg/dL — ABNORMAL HIGH (ref 65–99)
Glucose-Capillary: 156 mg/dL — ABNORMAL HIGH (ref 65–99)
Glucose-Capillary: 151 mg/dL — ABNORMAL HIGH (ref 65–99)
Glucose-Capillary: 170 mg/dL — ABNORMAL HIGH (ref 65–99)
Glucose-Capillary: 118 mg/dL — ABNORMAL HIGH (ref 65–99)

## 2016-08-13 NOTE — Telephone Encounter (Signed)
Transition Care Management Follow-up Telephone Call  How have you been since you were released from the hospital? Per patient wife (DPR) patient has a lot of congestion and still pretty sleepy, not eating much still wheezing. Only getting up for restroom.   Do you understand why you were in the hospital?  Not at first he had syncope episode during the night and EMS took patient to hospital and was DX with Flu Type B .   Do you understand the discharge instrcutions? Yes, patient forgets but DPR is aware.  Items Reviewed:  Medications reviewed: Yes, Tamiflu 30 mg BID onl;y medication change.  Allergies reviewed: Yes  Dietary changes reviewed:Yes, low sodium.  Referrals reviewed: Yes   Functional Questionnaire:   Activities of Daily Living (ADLs):   He states they are independent in the following: Patient wife is assisting with bathing ,clothing, preparing meals. States they require assistance with the following: Bathing , dressing , patient is very weak.   Any transportation issues/concerns?No   Any patient concerns? Really concerned about all the congestion, and patient weakness, and patient wife is concerned the Tamiflu is not strong enough for patient.   Confirmed importance and date/time of follow-up visits scheduled: Yes   Confirmed with patient if condition begins to worsen call PCP or go to the ER.  Patient was given the Call-a-Nurse line [phone]: Yes.

## 2016-08-13 NOTE — Telephone Encounter (Signed)
Agree with LPN advice. Plan on evaluating tomorrow in the office if he does not seek evaluation prior to then.

## 2016-08-13 NOTE — Telephone Encounter (Signed)
Patient discharged from [facility] on 08/12/16,pt has been scheduled for HFU.  Please contact Wife Christin [phone]

## 2016-08-13 NOTE — Telephone Encounter (Signed)
Patient wife is concerned that patient is still so tired and not up moving around and that congestion is still in throat, nasal passages, and patient has wheezing , advised DPR if patient is worsening she should have him re-evaluated at ER , DPR stated she would if he becomes worse but prefers to see PCP tomorrow.

## 2016-08-14 ENCOUNTER — Ambulatory Visit (INDEPENDENT_AMBULATORY_CARE_PROVIDER_SITE_OTHER): Payer: PPO

## 2016-08-14 ENCOUNTER — Ambulatory Visit (INDEPENDENT_AMBULATORY_CARE_PROVIDER_SITE_OTHER): Payer: PPO | Admitting: Family Medicine

## 2016-08-14 ENCOUNTER — Encounter: Payer: Self-pay | Admitting: Family Medicine

## 2016-08-14 VITALS — BP 126/76 | HR 94 | Temp 98.0°F | Wt 195.4 lb

## 2016-08-14 DIAGNOSIS — R062 Wheezing: Secondary | ICD-10-CM | POA: Diagnosis not present

## 2016-08-14 DIAGNOSIS — R05 Cough: Secondary | ICD-10-CM | POA: Diagnosis not present

## 2016-08-14 DIAGNOSIS — J111 Influenza due to unidentified influenza virus with other respiratory manifestations: Secondary | ICD-10-CM | POA: Diagnosis not present

## 2016-08-14 DIAGNOSIS — M6282 Rhabdomyolysis: Secondary | ICD-10-CM | POA: Diagnosis not present

## 2016-08-14 DIAGNOSIS — R059 Cough, unspecified: Secondary | ICD-10-CM

## 2016-08-14 LAB — COMPREHENSIVE METABOLIC PANEL WITH GFR
ALT: 36 U/L (ref 0–53)
AST: 51 U/L — ABNORMAL HIGH (ref 0–37)
Albumin: 3.8 g/dL (ref 3.5–5.2)
Alkaline Phosphatase: 24 U/L — ABNORMAL LOW (ref 39–117)
BUN: 15 mg/dL (ref 6–23)
CO2: 34 meq/L — ABNORMAL HIGH (ref 19–32)
Calcium: 9.8 mg/dL (ref 8.4–10.5)
Chloride: 86 meq/L — ABNORMAL LOW (ref 96–112)
Creatinine, Ser: 0.94 mg/dL (ref 0.40–1.50)
GFR: 82.81 mL/min
Glucose, Bld: 107 mg/dL — ABNORMAL HIGH (ref 70–99)
Potassium: 3.5 meq/L (ref 3.5–5.1)
Sodium: 129 meq/L — ABNORMAL LOW (ref 135–145)
Total Bilirubin: 0.4 mg/dL (ref 0.2–1.2)
Total Protein: 7 g/dL (ref 6.0–8.3)

## 2016-08-14 LAB — CK: Total CK: 468 U/L — ABNORMAL HIGH (ref 7–232)

## 2016-08-14 MED ORDER — PREDNISONE 20 MG PO TABS: 40.0000 mg | ORAL_TABLET | Freq: Every day | ORAL | 0 refills | Status: DC

## 2016-08-14 NOTE — Patient Instructions (Signed)
Nice to see you. We are going to start you on prednisone given your wheezing. You should finish the Tamiflu. We will check lab work today. We'll also check a chest x-ray to evaluate for any underlying pneumonia. If you develop any shortness of breath, abdominal pain, blood in your stool, cough productive of blood, fevers, or any new or changing symptoms please seek medical attention immediately. You need to stay well hydrated as well. Please drink plenty of fluids.

## 2016-08-14 NOTE — Progress Notes (Signed)
Pre visit review using our clinic review tool, if applicable. No additional management support is needed unless otherwise documented below in the visit note. 

## 2016-08-14 NOTE — Assessment & Plan Note (Signed)
Patient recently hospitalized for influenza and rhabdomyolysis. Overall not significantly improved since discharge. Not eating or drinking very much. Has had some loose stools and a little bit of vomiting yesterday. Benign abdominal exam today. Lung exam with a few crackles and wheezes. We will start on prednisone given his wheezing and smoking history. We will check a chest x-ray given his abnormal lung sounds to ensure that he has not developed a secondary pneumonia. He will finish his Tamiflu. He will use his albuterol inhaler every 6 hours for the next 2 days. We will check a CMP and a CK. He'll be rechecked in 2 days in the office. He is given return precautions.

## 2016-08-14 NOTE — Progress Notes (Signed)
Tommi Rumps, MD Phone: [phone]  James Hood is a 76 y.o. male who presents today for Hospital follow-up.  Patient hospitalized from 08/09/16-08/12/16 for somnolence and found to have influenza. His significant other found him passed out on the floor in the morning sitting in his urine and feces. He is unarousable. He was transported to the emergency room and had a unremarkable workup with regards to CT scan and chest x-ray. He was noted to have influenza. He is also found to have rhabdomyolysis with elevated CK. He was admitted and treated with Tamiflu and IV fluids. His CK was trending down at discharge. Since discharge he has not had much of an appetite. He's been eating protein shakes. He notes some nausea and a little bit of vomiting yesterday. Some loose stools. No blood in the stool. No abdominal pain. Lots of cough and congestion. Some wheezing. No shortness of breath. He has been taking the Tamiflu. Overall just does not feel all that well. His discharge summary has been reviewed.  PMH: smoker   ROS see history of present illness  Objective  Physical Exam Vitals:   08/14/16 1139  BP: 126/76  Pulse: 94  Temp: 98 F (36.7 C)    BP Readings from Last 3 Encounters:  08/14/16 126/76  08/12/16 127/67  08/06/16 (!) 122/46   Wt Readings from Last 3 Encounters:  08/14/16 195 lb 6.4 oz (88.6 kg)  08/09/16 192 lb 6.4 oz (87.3 kg)  08/06/16 200 lb (90.7 kg)    Physical Exam  Constitutional: No distress.  HENT:  Head: Normocephalic and atraumatic.  Mouth/Throat: Oropharynx is clear and moist. No oropharyngeal exudate.  Eyes: Conjunctivae are normal. Pupils are equal, round, and reactive to light.  Cardiovascular: Normal rate, regular rhythm and normal heart sounds.   Pulmonary/Chest: Effort normal. No respiratory distress. He has wheezes (scattered expiratory wheezes). He has no rales.  Scattered crackles  Abdominal: Soft. Bowel sounds are normal. He exhibits no  distension. There is no tenderness. There is no rebound and no guarding.  Neurological: He is alert.  Skin: Skin is warm and dry. He is not diaphoretic.     Assessment/Plan: Please see individual problem list.  Flu Patient recently hospitalized for influenza and rhabdomyolysis. Overall not significantly improved since discharge. Not eating or drinking very much. Has had some loose stools and a little bit of vomiting yesterday. Benign abdominal exam today. Lung exam with a few crackles and wheezes. We will start on prednisone given his wheezing and smoking history. We will check a chest x-ray given his abnormal lung sounds to ensure that he has not developed a secondary pneumonia. He will finish his Tamiflu. He will use his albuterol inhaler every 6 hours for the next 2 days. We will check a CMP and a CK. He'll be rechecked in 2 days in the office. He is given return precautions.   Orders Placed This Encounter  Procedures  . DG Chest 2 View    Standing Status:   Future    Number of Occurrences:   1    Standing Expiration Date:   10/14/2017    Order Specific Question:   Reason for Exam (SYMPTOM  OR DIAGNOSIS REQUIRED)    Answer:   recent flu diagnosis, cough, wheezing, crackles on exam    Order Specific Question:   Preferred imaging location?    Answer:   [facility]  . CK (Creatine Kinase)  . Comp Met (CMET)    Meds ordered this  encounter  Medications  . predniSONE (DELTASONE) 20 MG tablet    Sig: Take 2 tablets (40 mg total) by mouth daily with breakfast.    Dispense:  10 tablet    Refill:  0    Tommi Rumps, MD [place]

## 2016-08-15 ENCOUNTER — Ambulatory Visit: Payer: PPO | Admitting: Family Medicine

## 2016-08-15 NOTE — Telephone Encounter (Signed)
HFU 

## 2016-08-16 ENCOUNTER — Ambulatory Visit (INDEPENDENT_AMBULATORY_CARE_PROVIDER_SITE_OTHER): Payer: PPO | Admitting: Family Medicine

## 2016-08-16 ENCOUNTER — Other Ambulatory Visit: Payer: Self-pay | Admitting: Family Medicine

## 2016-08-16 ENCOUNTER — Encounter: Payer: Self-pay | Admitting: Family Medicine

## 2016-08-16 VITALS — BP 124/72 | HR 111 | Temp 97.9°F | Wt 189.8 lb

## 2016-08-16 DIAGNOSIS — M25551 Pain in right hip: Secondary | ICD-10-CM

## 2016-08-16 DIAGNOSIS — E871 Hypo-osmolality and hyponatremia: Secondary | ICD-10-CM

## 2016-08-16 DIAGNOSIS — J111 Influenza due to unidentified influenza virus with other respiratory manifestations: Secondary | ICD-10-CM

## 2016-08-16 LAB — BASIC METABOLIC PANEL
BUN: 20 mg/dL (ref 6–23)
CO2: 32 mEq/L (ref 19–32)
Calcium: 10.1 mg/dL (ref 8.4–10.5)
Chloride: 91 mEq/L — ABNORMAL LOW (ref 96–112)
Creatinine, Ser: 1.09 mg/dL (ref 0.40–1.50)
GFR: 69.8 mL/min (ref >60.00)
Glucose, Bld: 114 mg/dL — ABNORMAL HIGH (ref 70–99)
Potassium: 3.3 mEq/L — ABNORMAL LOW (ref 3.5–5.1)
Sodium: 133 mEq/L — ABNORMAL LOW (ref 135–145)

## 2016-08-16 MED ORDER — POTASSIUM CHLORIDE CRYS ER 20 MEQ PO TBCR: 40.0000 meq | EXTENDED_RELEASE_TABLET | Freq: Every day | ORAL | 0 refills | Status: DC

## 2016-08-16 NOTE — Assessment & Plan Note (Signed)
Appears to be slowly improving. Better appetite and fluid intake over the last day or so. He is tachycardic which could be attributed to the prednisone or to him being dehydrated. He is due to have kidney function rechecked today. Encouraged hydration and food intake. They will monitor his symptoms and if he does not continue to improve they will follow-up. If he worsens or develops new symptoms he will be evaluated over the weekend.

## 2016-08-16 NOTE — Assessment & Plan Note (Signed)
Suspect related to a combination of his medications and his recent illness. He has been holding his lisinopril HCTZ. We will check lab work today. They'll continue to hold his the lisinopril-HCTZ through the weekend until he is starting to feel better.

## 2016-08-16 NOTE — Progress Notes (Signed)
Pre visit review using our clinic review tool, if applicable. No additional management support is needed unless otherwise documented below in the visit note. 

## 2016-08-16 NOTE — Progress Notes (Signed)
Tommi Rumps, MD Phone: [phone]  James Hood is a 76 y.o. male who presents today for follow-up.  Patient seen several days ago for hospital follow-up following influenza and rhabdomyolysis. He notes he is feeling improved. Still does not feel great though is better than previously. He started eating yesterday and taking in more liquids. Notes that wheezing is significantly improved. His breathing is better. Still some cough. Still some congestion. He's finished the Tamiflu. He was noted to be hyponatremic previously and we asked him to hold his lisinopril and HCTZ. He has not been taking this. Has been taking the prednisone. He notes small amounts of loose stool 4-5 times a day though no liquidy stools. No blood in stool. No nausea or vomiting. No abdominal pain. Notes his right hip is a little bit sore when he turns over in bed and this was felt to be related to him being found on his right side by his wife. He notes no pain with walking. No fevers.   ROS see history of present illness  Objective  Physical Exam Vitals:   08/16/16 0915  BP: 124/72  Pulse: (!) 111  Temp: 97.9 F (36.6 C)    BP Readings from Last 3 Encounters:  08/16/16 124/72  08/14/16 126/76  08/12/16 127/67   Wt Readings from Last 3 Encounters:  08/16/16 189 lb 12.8 oz (86.1 kg)  08/14/16 195 lb 6.4 oz (88.6 kg)  08/09/16 192 lb 6.4 oz (87.3 kg)    Physical Exam  Constitutional: No distress.  HENT:  Head: Normocephalic and atraumatic.  Mouth/Throat: Oropharynx is clear and moist. No oropharyngeal exudate.  Eyes: Conjunctivae are normal. Pupils are equal, round, and reactive to light.  Neck: Neck supple.  Cardiovascular: Regular rhythm and normal heart sounds.  Tachycardia present.   Pulmonary/Chest: Effort normal and breath sounds normal.  Musculoskeletal:  Normal internal and external range of motion bilateral hips, no tenderness of the right hip, no bruising noted  Lymphadenopathy:   He has no cervical adenopathy.  Neurological: He is alert. Gait normal.  CN 2-12 intact, 5/5 strength in bilateral biceps, triceps, grip, quads, hamstrings, plantar and dorsiflexion, sensation to light touch intact in bilateral UE and LE  Skin: Skin is warm and dry. He is not diaphoretic.     Assessment/Plan: Please see individual problem list.  Flu Appears to be slowly improving. Better appetite and fluid intake over the last day or so. He is tachycardic which could be attributed to the prednisone or to him being dehydrated. He is due to have kidney function rechecked today. Encouraged hydration and food intake. They will monitor his symptoms and if he does not continue to improve they will follow-up. If he worsens or develops new symptoms he will be evaluated over the weekend.  Hyponatremia Suspect related to a combination of his medications and his recent illness. He has been holding his lisinopril HCTZ. We will check lab work today. They'll continue to hold his the lisinopril-HCTZ through the weekend until he is starting to feel better.  Right hip pain Suspect soft tissue injury from him laying on his hip while being down on the ground for some period of time. Benign exam today. CK was trending down previously. He'll continue to monitor his hip and if worsens or changes to let us know.  Of note the patient was noted to have metoprolol and propranolol on his medication list. His wife notes that he is only taking one of these. She'll contact the office  to let us know which one he is taking.  Orders Placed This Encounter  Procedures  . Basic metabolic panel    Tommi Rumps, MD [place]

## 2016-08-16 NOTE — Patient Instructions (Signed)
Nice to see you. We'll recheck some lab work today and contact her with the results. Please try to take in plenty of fluids. Please also increase your food intake. Please monitor your loose stools and if they worsen please let us know. Please continue to hold  the lisinopril/HCTZ until you are starting to feel better. Please monitor your blood pressure at home with this. If you have worsening symptoms, any new or changing symptoms, or start to feel worse please seek medical attention medially.

## 2016-08-16 NOTE — Assessment & Plan Note (Signed)
Suspect soft tissue injury from him laying on his hip while being down on the ground for some period of time. Benign exam today. CK was trending down previously. He'll continue to monitor his hip and if worsens or changes to let us know.

## 2016-08-20 ENCOUNTER — Telehealth: Payer: Self-pay | Admitting: Anesthesiology

## 2016-08-20 NOTE — Telephone Encounter (Signed)
Patient states he will run out of meds before next visit. No sooner visits available.

## 2016-08-20 NOTE — Telephone Encounter (Signed)
Called patient back with no answer and no way to leave VM. He had a script not to fill until 08-17-2016 so he should not be out of medications at this point. No early refills or appointments. Marland Kitchen He must keep his regular appt. on 09/04/2016

## 2016-08-22 NOTE — Telephone Encounter (Signed)
He said that he doesn't have a script to be filled 08/17/16 and is out of meds today. Please advise

## 2016-08-22 NOTE — Telephone Encounter (Signed)
Attempted to call patient,# does not have voicemail capability.  Have reviewed previous visit and the Percocet does have a do not fill date until August 17, 2016.   Attempted to reach [place], closed for lunch, will reattempt contact after 1430

## 2016-08-22 NOTE — Telephone Encounter (Signed)
Spoke with pharmacy, last fill was on 07/18/16 and they do not have an Rx on file. For Mr James Hood. Called Mobile number listed on snapshot that was for [facility] and left voicemail to please give [place] a call re; Rx.

## 2016-08-23 ENCOUNTER — Ambulatory Visit: Payer: PPO | Attending: Anesthesiology | Admitting: Anesthesiology

## 2016-08-23 VITALS — BP 108/63 | HR 93 | Temp 98.3°F | Resp 16 | Ht 71.0 in | Wt 194.0 lb

## 2016-08-23 DIAGNOSIS — Z79891 Long term (current) use of opiate analgesic: Secondary | ICD-10-CM | POA: Diagnosis not present

## 2016-08-23 DIAGNOSIS — M1711 Unilateral primary osteoarthritis, right knee: Secondary | ICD-10-CM | POA: Diagnosis not present

## 2016-08-23 DIAGNOSIS — F1021 Alcohol dependence, in remission: Secondary | ICD-10-CM | POA: Diagnosis not present

## 2016-08-23 DIAGNOSIS — Z818 Family history of other mental and behavioral disorders: Secondary | ICD-10-CM | POA: Diagnosis not present

## 2016-08-23 DIAGNOSIS — M47816 Spondylosis without myelopathy or radiculopathy, lumbar region: Secondary | ICD-10-CM | POA: Diagnosis not present

## 2016-08-23 DIAGNOSIS — Z7984 Long term (current) use of oral hypoglycemic drugs: Secondary | ICD-10-CM | POA: Diagnosis not present

## 2016-08-23 DIAGNOSIS — M5442 Lumbago with sciatica, left side: Secondary | ICD-10-CM | POA: Insufficient documentation

## 2016-08-23 DIAGNOSIS — M25572 Pain in left ankle and joints of left foot: Secondary | ICD-10-CM | POA: Diagnosis not present

## 2016-08-23 DIAGNOSIS — E7800 Pure hypercholesterolemia, unspecified: Secondary | ICD-10-CM | POA: Insufficient documentation

## 2016-08-23 DIAGNOSIS — Z96651 Presence of right artificial knee joint: Secondary | ICD-10-CM | POA: Diagnosis not present

## 2016-08-23 DIAGNOSIS — G250 Essential tremor: Secondary | ICD-10-CM | POA: Diagnosis not present

## 2016-08-23 DIAGNOSIS — Z8711 Personal history of peptic ulcer disease: Secondary | ICD-10-CM | POA: Insufficient documentation

## 2016-08-23 DIAGNOSIS — K219 Gastro-esophageal reflux disease without esophagitis: Secondary | ICD-10-CM | POA: Diagnosis not present

## 2016-08-23 DIAGNOSIS — E0590 Thyrotoxicosis, unspecified without thyrotoxic crisis or storm: Secondary | ICD-10-CM | POA: Diagnosis not present

## 2016-08-23 DIAGNOSIS — F17210 Nicotine dependence, cigarettes, uncomplicated: Secondary | ICD-10-CM | POA: Diagnosis not present

## 2016-08-23 DIAGNOSIS — Z7982 Long term (current) use of aspirin: Secondary | ICD-10-CM | POA: Insufficient documentation

## 2016-08-23 DIAGNOSIS — I10 Essential (primary) hypertension: Secondary | ICD-10-CM | POA: Insufficient documentation

## 2016-08-23 DIAGNOSIS — M5386 Other specified dorsopathies, lumbar region: Secondary | ICD-10-CM

## 2016-08-23 DIAGNOSIS — Z79899 Other long term (current) drug therapy: Secondary | ICD-10-CM | POA: Diagnosis not present

## 2016-08-23 DIAGNOSIS — M4696 Unspecified inflammatory spondylopathy, lumbar region: Secondary | ICD-10-CM

## 2016-08-23 DIAGNOSIS — Z8249 Family history of ischemic heart disease and other diseases of the circulatory system: Secondary | ICD-10-CM | POA: Diagnosis not present

## 2016-08-23 DIAGNOSIS — F419 Anxiety disorder, unspecified: Secondary | ICD-10-CM | POA: Insufficient documentation

## 2016-08-23 DIAGNOSIS — G8929 Other chronic pain: Secondary | ICD-10-CM | POA: Insufficient documentation

## 2016-08-23 DIAGNOSIS — F329 Major depressive disorder, single episode, unspecified: Secondary | ICD-10-CM | POA: Diagnosis not present

## 2016-08-23 DIAGNOSIS — F0390 Unspecified dementia without behavioral disturbance: Secondary | ICD-10-CM | POA: Diagnosis not present

## 2016-08-23 DIAGNOSIS — E1140 Type 2 diabetes mellitus with diabetic neuropathy, unspecified: Secondary | ICD-10-CM | POA: Diagnosis not present

## 2016-08-23 DIAGNOSIS — M539 Dorsopathy, unspecified: Secondary | ICD-10-CM

## 2016-08-23 MED ORDER — OXYCODONE-ACETAMINOPHEN 7.5-325 MG PO TABS: 1.0000 | ORAL_TABLET | Freq: Three times a day (TID) | ORAL | 0 refills | Status: DC

## 2016-08-23 NOTE — Progress Notes (Signed)
Subjective:  Patient ID: James Hood, male    DOB: [birth year]  Age: 76 y.o. MRN: [id number]  CC: Ankle Pain (left) and Hip Pain (left)     Procedure: None Previous PROCEDURE:Caudal epidural steroid No. 2 under fluoroscopic guidance with moderate sedation  HPI James Hood presents for reevaluation. He was last seen approximately a month ago and has been on his oxycodone 7.5 mg tablets 3 times a day and this is been working well for him. He maintains that he is using them as prescribed with no diverting or illicit use. We have reviewed his narcotic assessment sheet and it shows an improvement in overall lifestyle function based on questionnaire. The quality characteristic addition patient's pain has been stable in nature. Otherwise he is in his usual state of health at this time.  By history  He is a pleasant 76 year old white male with a long-standing history of low back pain for over one year. He reports that he had a laminectomy in October of this year with Dr. Joya Salm. He also states that he has continued to have lancinating left posterior and lateral leg pain described as sharp nagging pulsating and throbbing it's present throughout much of the day. He rates this as a VAS of 7 best a 2 and now a 5. He feels that it is gotten worse. He has some chronic intermittent weakness of the left leg. He had an MRI that was reviewed with him today that is in follow-up to his October surgery. He also reports having had 3 epidural steroids prior to his surgery and 1 L5-S1 epidural after this. The pain is worse with bending climbing motion squatting and better with rest sleep. 5 mg of oxycodone does not give him much relief. We have reviewed his physician database and there are no concerns. He also takes Neurontin 1200 mg 3 times a day.  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   His family history includes Depression in his father; Heart attack in his mother; Heart disease in his mother; Hypertension in his father.He reports that he has been smoking Cigarettes.  He has a 60.00 pack-year smoking history. He has never used smokeless tobacco. He reports that he does not drink alcohol or use drugs.  No results found for this or any previous visit.  Tox[employer] Select 13  Date Value Ref Range Status  05/03/2016 FINAL  Final    Comment:    ==================================================================== TOXASSURE SELECT 13 (MW) ==================================================================== Test                             Result       Flag       Units Drug Present and Declared for Prescription Verification   Oxycodone                      140          EXPECTED   ng/mg creat   Oxymorphone  127          EXPECTED   ng/mg creat   Noroxycodone                   903          EXPECTED   ng/mg creat   Noroxymorphone                 71           EXPECTED   ng/mg creat    Sources of oxycodone are scheduled prescription medications.    Oxymorphone, noroxycodone, and noroxymorphone are expected    metabolites of oxycodone. Oxymorphone is also available as a    scheduled prescription medication.   Phenobarbital                  PRESENT      EXPECTED    Phenobarbital is an expected metabolite of primidone;    Phenobarbital may also be administered as a prescription drug. Drug  Absent but Declared for Prescription Verification   Desmethyldiazepam              Not Detected UNEXPECTED ng/mg creat    Desmethyldiazepam is an expected metabolite of chlordiazepoxide,    clorazepate, halazepam, and prazepam.   Hydrocodone                    Not Detected UNEXPECTED ng/mg creat   Tramadol                       Not Detected UNEXPECTED ==================================================================== Test                      Result    Flag   Units      Ref Range   Creatinine              75               mg/dL      >=20 ==================================================================== Declared Medications:  The flagging and interpretation on this report are based on the  following declared medications.  Unexpected results may arise from  inaccuracies in the declared medications.  **Note: The testing scope of this panel includes these medications:  Clorazepate (Tranxene)  Hydrocodone (Norco)  Oxycodone  Oxycodone (Percocet)  Primidone (Mysoline)  Tramadol (Ultram)  **Note: The testing scope of this panel does not include following  reported medications:  Acetaminophen (Norco)  Acetaminophen (Percocet)  Acetaminophen (Tylenol)  Aspirin  Atropine (Lomotil)  Bupropion (Zyban)  Colestipol  Cyclobenzaprine (Flexeril)  Diphenoxylate (Lomotil)  Divalproex (Depakote)  Donepezil (Aricept)  Fenofibrate (Tricor)  Finasteride (Proscar)  Gabapentin (Neurontin)  Glimepiride (Amaryl)  Hydrochlorothiazide (Prinzide)  Hydrochlorothiazide (Zestoretic)  Lisinopril (Prinzide)  Lisinopril (Zestoretic)  Memantine (Namenda)  Metformin (Glucophage)  Metoprolol (Lopressor)  Metoprolol (Toprol)  Omeprazole (Prilosec)  Prednisone (Sterapred)  Propranolol (Inderal)  Quetiapine (Seroquel)  Simvastatin (Zocor)  Tamsulosin (Flomax) ==================================================================== For clinical consultation, please call [phone]. ====================================================================     Outpatient Medications Prior to Visit  Medication Sig Dispense Refill  . acetaminophen (TYLENOL) 500 MG tablet Take 1,000 mg by mouth as needed.     Marland Kitchen albuterol (PROVENTIL HFA;VENTOLIN HFA) 108 (90 Base) MCG/ACT inhaler Inhale 2 puffs into the lungs every 6 (six) hours as needed for wheezing or shortness of breath. 1 Inhaler 2  . aspirin EC 81 MG tablet Take 81 mg by mouth every morning.     Marland Kitchen  buPROPion (ZYBAN) 150 MG 12 hr tablet Take 1 tablet (150 mg total) by mouth 2 (two) times daily. 180 tablet 1  . clorazepate (TRANXENE) 3.75 MG tablet Take 1 tablet (3.75 mg total) by mouth at bedtime as needed for anxiety. 30 tablet 0  . colestipol (COLESTID) 1 g tablet Take 2 g by mouth daily.     . cyclobenzaprine (FLEXERIL) 10 MG tablet Take 10 mg by mouth 3 (three) times daily as needed for muscle spasms.    . diphenoxylate-atropine (LOMOTIL) 2.5-0.025 MG tablet Take 1-2 tablets by mouth 2 (two) times daily. Take 2 tablets in the morning, and 1 tablet in the evening    . divalproex (DEPAKOTE ER) 250 MG 24 hr tablet Take 250 mg by mouth 2 (two) times daily. Pt takes 250mg  in the morning, and 250mg  in the afternoon    . donepezil (ARICEPT) 10 MG tablet Take 10 mg by mouth at bedtime.    . fenofibrate (TRICOR) 145 MG tablet TAKE ONE TABLET BY MOUTH DAILY 90 tablet 0  . finasteride (PROSCAR) 5 MG tablet Take 1 tablet (5 mg total) by mouth daily. 30 tablet 11  . gabapentin (NEURONTIN) 600 MG tablet Take 2 tablets (1,200 mg total) by mouth 3 (three) times daily. 180 tablet 3  . glimepiride (AMARYL) 4 MG tablet Take 1 tablet (4 mg total) by mouth 2 (two) times daily. 180 tablet 3  . lisinopril-hydrochlorothiazide (PRINZIDE,ZESTORETIC) 20-25 MG tablet Take 1 tablet by mouth 2 (two) times daily. 180 tablet 2  . memantine (NAMENDA) 10 MG tablet Take 10 mg by mouth 2 (two) times daily.     . metFORMIN (GLUCOPHAGE) 500 MG tablet  TAKE TWO TABLETS BY MOUTH TWICE A DAY WITH A MEAL 180 tablet 2  . metoprolol succinate (TOPROL-XL) 25 MG 24 hr tablet Take 25 mg by mouth daily.     Marland Kitchen omeprazole (PRILOSEC) 20 MG capsule 20 mg 2 (two) times daily before a meal.     . oxyCODONE (OXY IR/ROXICODONE) 5 MG immediate release tablet Take 1 tablet (5 mg total) by mouth every 6 (six) hours as needed for severe pain. 45 tablet 0  . potassium chloride SA (K-DUR,KLOR-CON) 20 MEQ tablet Take 2 tablets (40 mEq total) by mouth daily. 6 tablet 0  . predniSONE (DELTASONE) 20 MG tablet Take 2 tablets (40 mg total) by mouth daily with breakfast. 10 tablet 0  . primidone (MYSOLINE) 50 MG tablet TAKE 5 TABLETS IN THE MORNING AND 3 TABLETS IN THE EVENING    . propranolol (INDERAL) 60 MG tablet Take 60 mg by mouth 2 (two) times daily.     . QUEtiapine (SEROQUEL) 25 MG tablet Take 75 mg by mouth at bedtime.     . simvastatin (ZOCOR) 40 MG tablet Take 1 tablet (40 mg total) by mouth daily. 90 tablet 1  . sitaGLIPtin (JANUVIA) 100 MG tablet Take 1 tablet (100 mg total) by mouth daily. 30 tablet 3  . tamsulosin (FLOMAX) 0.4 MG CAPS capsule Take 0.4 mg by mouth daily after supper.     Marland Kitchen oxyCODONE-acetaminophen (PERCOCET) 7.5-325 MG tablet Take 1 tablet by mouth 3 (three) times daily. 90 tablet 0   No facility-administered medications prior to visit.    Lab Results  Component Value Date   WBC 6.4 08/10/2016   HGB 10.8 (L) 08/10/2016   HCT 31.2 (L) 08/10/2016   PLT 219 08/10/2016   GLUCOSE 114 (H) 08/16/2016   CHOL 161 09/16/2015   TRIG 393.0 (H)  09/16/2015   HDL 38.50 (L) 09/16/2015   LDLDIRECT 66.0 09/16/2015   ALT 36 08/14/2016   AST 51 (H) 08/14/2016   NA 133 (L) 08/16/2016   K 3.3 (L) 08/16/2016   CL 91 (L) 08/16/2016   CREATININE 1.09 08/16/2016   BUN 20 08/16/2016   CO2 32 08/16/2016   TSH 0.490 08/09/2016   PSA 0.07 (L) 09/16/2015   INR 1.08 11/05/2014   HGBA1C 7.7 (H) 06/13/2016     --------------------------------------------------------------------------------------------------------------------- Mr Lumbar Spine Wo Contrast  Result Date: 03/29/2016 CLINICAL DATA:  Initial evaluation for chronic low back pain with left-sided sciatica, worsened. EXAM: MRI LUMBAR SPINE WITHOUT CONTRAST TECHNIQUE: Multiplanar, multisequence MR imaging of the lumbar spine was performed. No intravenous contrast was administered. COMPARISON:  Prior MRI from 09/22/2015. FINDINGS: Segmentation: L normal segmentation. Lowest well-formed disc is labeled the L5-S1 level. Same numbering system is employed as on previous exams. Alignment: Straightening with slight reversal of the normal lumbar lordosis, stable. No listhesis. Vertebrae: Vertebral body heights are maintained. No evidence for acute or chronic fracture. Reactive endplate changes about the left aspect of the L5-S1 interspace, similar to previous. Postoperative changes from recent decompressive left hemi laminectomy with micro discectomy seen at L5-S1. No concerning features identified. Conus medullaris: Extends to the L1 level and appears normal. Paraspinal and other soft tissues: Normal expected postoperative no concerning features identified. Paraspinous soft tissues otherwise within normal limits. Visualized visceral structures are normal. No retroperitoneal adenopathy. Changes related to decompressive laminectomy present within the posterior paraspinous soft tissues of the lower back. Disc levels: L1-2: Degenerative disc desiccation with intervertebral disc space narrowing and mild disc bulge. No stenosis. L2-3: Degenerative disc desiccation with intervertebral disc space narrowing and mild disc bulge. No stenosis. L3-4: Degenerative disc desiccation with mild diffuse disc bulge. Mild facet hypertrophy. Mild right foraminal stenosis related to disc bulge and facet disease, stable. No canal or left foraminal narrowing. L4-5: Degenerative disc bulge  with intervertebral disc space narrowing and disc desiccation. Disc bulging a centric to the right without focal disc protrusion. Superimposed bilateral facet arthrosis with ligamentum flavum hypertrophy. Reactive effusions within the bilateral L4-5 facets, stable. Minimal bilateral subarticular stenosis, slightly greater on the left. Mild bilateral foraminal narrowing related to disc bulge and facet disease, stable. L5-S1: Postoperative changes from interval decompressive left hemi laminectomy with micro discectomy. Previously seen left subarticular disc protrusion has been largely resected, although there appears to be a small residual central disc protrusion that minimally indents the ventral thecal sac (series 6, image 28). Soft tissue density within the left lateral epidural space likely reflects postoperative granulation tissue, although evaluation somewhat limited due to lack of IV contrast. This surrounds the transiting S1 nerve root in the left lateral recess (series 7, image 28). No significant canal stenosis. Superimposed bilateral facet arthrosis with reactive effusions in the bilateral L5-S1 facets is similar to previous. Mild left foraminal narrowing is relatively unchanged. No significant right foraminal stenosis. IMPRESSION: 1. Postoperative changes from recent decompressive left hemi laminectomy with microdiskectomy at L5-S1. Postoperative granulation tissue within the left epidural space, surrounding the descending left S1 nerve root. No residual stenosis or concerning features identified. 2. 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 No other significant stenosis or evidence for impingement. Electronically Signed   By: Jeannine Boga M.D.   On: 03/29/2016 13:10       ---------------------------------------------------------------------------------------------------------------------- Past Medical History:  Diagnosis Date  .  Anxiety   .  Anxiety and depression   . Arthritis   . Colon polyps   . Dementia   . Depression   . Diabetes mellitus type 2 in nonobese ([facility])   . GERD (gastroesophageal reflux disease)   . Headache   . 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 History of hiatal hernia   . Hypercholesteremia   . Hypertension   . Hyperthyroidism   . Neuropathic pain   . Primary localized osteoarthritis of right knee   . Stomach ulcer   . Tremor, essential     Past Surgical History:  Procedure Laterality Date  . esophageal stretch    . JOINT REPLACEMENT    . LUMBAR LAMINECTOMY/DECOMPRESSION MICRODISCECTOMY Left 02/03/2016   Procedure: LEFT L5-S1 DISKECTOMY;  Surgeon: Leeroy Cha, MD;  Location: [facility];  Service: Neurosurgery;  Laterality: Left;  LEFT L5-S1 DISKECTOMY  . TONSILLECTOMY    . TOTAL KNEE ARTHROPLASTY Right 11/15/2014   Procedure: TOTAL KNEE ARTHROPLASTY;  Surgeon: Elsie Saas, MD;  Location: [place];  Service: Orthopedics;  Laterality: Right;    Family History  Problem Relation Age of Onset  . Heart attack Mother   . Heart disease Mother   . Hypertension Father   . Depression Father   . Kidney cancer Neg Hx   . Prostate cancer Neg Hx     Social History  Substance Use Topics  . Smoking status: Current Every Day Smoker    Packs/day: 1.00    Years: 60.00    Types: Cigarettes  . Smokeless tobacco: Never Used  . 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 Marital status: Married    Spouse name: N/A  . Number of children: N/A  . Years of education: N/A   Social History Main Topics  . Smoking status: Current Every Day Smoker    Packs/day: 1.00    Years: 60.00    Types: Cigarettes  . Smokeless tobacco: Never Used  . 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 Drug use: No  .  Sexual activity: Not Currently   Other Topics Concern  . Not on file   Social History Narrative  . No narrative on file    Scheduled Meds: Continuous Infusions: PRN Meds:.   BP 108/63   Pulse 93   Temp 98.3 F (36.8 C) (Oral)   Resp 16   Ht 5\' 11"  (1.803 m)   Wt 194 lb (88 kg)   SpO2 95%   BMI 27.06 kg/m    BP Readings from Last 3 Encounters:  08/23/16 108/63  08/16/16 124/72  08/14/16 126/76     Wt Readings from Last 3 Encounters:  08/23/16 194 lb (88 kg)  08/16/16 189 lb 12.8 oz (86.1 kg)  08/14/16 195 lb 6.4 oz (88.6 kg)     ----------------------------------------------------------------------------------------------------------------------  ROS Review of Systems  Cardiac: No angina or shortness of breath GI: No constipationOtherwise no change in review of systems.  Objective:  BP 108/63   Pulse 93   Temp 98.3 F (36.8 C) (Oral)   Resp 16   Ht 5\' 11"  (1.803 m)   Wt 194 lb (88 kg)   SpO2 95%   BMI 27.06 kg/m   Physical Exam Patient is alert oriented cooperative compliant Lungs are clear to auscultation S    Assessment & Plan:   James Hood was seen today for ankle pain  and hip pain.  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     ----------------------------------------------------------------------------------------------------------------------  Problem List Items Addressed This Visit      Other   Chronic lumbar pain   Relevant Medications   oxyCODONE-acetaminophen (PERCOCET) 7.5-325 MG tablet    Other Visit Diagnoses    Sciatica of left side associated with disorder of lumbar spine    -  Primary   Relevant Medications   buPROPion (WELLBUTRIN SR)  150 MG 12 hr tablet   divalproex (DEPAKOTE ER) 500 MG 24 hr tablet   QUEtiapine (SEROQUEL) 25 MG tablet   Facet arthritis of lumbar region ([place])       Relevant Medications   oxyCODONE-acetaminophen (PERCOCET) 7.5-325 MG tablet      ----------------------------------------------------------------------------------------------------------------------  1. Sciatica of left side associated with disorder of lumbar spine Into new back stretching strengthening exercises and core strengthening with aerobic conditioning as discussed  2. Left side lumbar facet arthropathy. We will plan on a diagnostic lumbar facet block at the next visit at his next visit. We have gone over the risks and benefits of the procedure in detail. 3. Chronic left-sided low back pain with left-sided sciatica We will refill the medications for today's date. This would be for a fill date of 08/23/2016 per Percocet 7.5 mg tablets #90. He would be due for a next refill on May 5 of this year. We have reviewed the [facility] practitioner database and there is no evidence of inappropriate used or diversion. Furthermore He denies any return to alcohol as he does have a previous alcohol dependency history. ----------------------------------------------------------------------------------------------------------------------  I am having James Hood maintain his divalproex, cyclobenzaprine, donepezil, QUEtiapine, colestipol, aspirin EC, diphenoxylate-atropine, tamsulosin, propranolol, finasteride, clorazepate, acetaminophen, simvastatin, metoprolol succinate, omeprazole, memantine, primidone, gabapentin, lisinopril-hydrochlorothiazide, buPROPion, sitaGLIPtin, oxyCODONE, glimepiride, metFORMIN, fenofibrate, albuterol, predniSONE, potassium chloride SA, oxyCODONE-acetaminophen, buPROPion, divalproex, doxycycline, oseltamivir, and QUEtiapine.   Meds ordered this encounter  Medications  . oxyCODONE-acetaminophen (PERCOCET) 7.5-325 MG  tablet    Sig: Take 1 tablet by mouth 3 (three) times daily.    Dispense:  90 tablet    Refill:  0  . buPROPion (WELLBUTRIN SR) 150 MG 12 hr tablet  . divalproex (DEPAKOTE ER) 500 MG 24 hr tablet  . doxycycline (VIBRA-TABS) 100 MG tablet  . oseltamivir (TAMIFLU) 6 MG/ML SUSR suspension  . QUEtiapine (SEROQUEL) 25 MG tablet    Sig: TAKE THREE TO FOUR TABLETS BY MOUTH AT NIGHT        Follow-up: Return in about 1 month (around 09/22/2016) for evaluation, med refill.    Molli Barrows, MD @DATE @  The  practitioner database for opioid medications on this patient has been reviewed by me and my staff   Greater than 50% of the total encounter time was spent in counseling and / or coordination of care.     This dictation was performed utilizing Systems analyst.  Please excuse any unintentional or mistaken typographical errors as a result.

## 2016-08-23 NOTE — Patient Instructions (Signed)
Pain Management Discharge Instructions  General Discharge Instructions :  If you need to reach your doctor call: Monday-Friday 8:00 am - 4:00 pm at [phone] or toll free [phone].  After clinic hours [phone] to have operator reach doctor.  Bring all of your medication bottles to all your appointments in the pain clinic.  To cancel or reschedule your appointment with Pain Management please remember to call 24 hours in advance to avoid a fee.  Refer to the educational materials which you have been given on: General Risks, I had my Procedure. Discharge Instructions, Post Sedation.  Post Procedure Instructions:  The drugs you were given will stay in your system until tomorrow, so for the next 24 hours you should not drive, make any legal decisions or drink any alcoholic beverages.  You may eat anything you prefer, but it is better to start with liquids then soups and crackers, and gradually work up to solid foods.  Please notify your doctor immediately if you have any unusual bleeding, trouble breathing or pain that is not related to your normal pain.  Depending on the type of procedure that was done, some parts of your body may feel week and/or numb.  This usually clears up by tonight or the next day.  Walk with the use of an assistive device or accompanied by an adult for the 24 hours.  You may use ice on the affected area for the first 24 hours.  Put ice in a Ziploc bag and cover with a towel and place against area 15 minutes on 15 minutes off.  You may switch to heat after 24 hours.Facet Joint Block The facet joints connect the bones of the spine (vertebrae). They make it possible for you to bend, twist, and make other movements with your spine. They also keep you from bending too far, twisting too far, and making other excessive movements. A facet joint block is a procedure where a numbing medicine (anesthetic) is injected into a facet joint. Often, a type of  anti-inflammatory medicine called a steroid is also injected. A facet joint block may be done to diagnose neck or back pain. If the pain gets better after a facet joint block, it means the pain is probably coming from the facet joint. If the pain does not get better, it means the pain is probably not coming from the facet joint. A facet joint block may also be done to relieve neck or back pain caused by an inflamed facet joint. A facet joint block is only done to relieve pain if the pain does not improve with other methods, such as medicine, exercise programs, and physical therapy. Tell a health care provider about:  Any allergies you have.  All medicines you are taking, including vitamins, herbs, eye drops, creams, and over-the-counter medicines.  Any problems you or family members have had with anesthetic medicines.  Any blood disorders you have.  Any surgeries you have had.  Any medical conditions you have.  Whether you are pregnant or may be pregnant. What are the risks? Generally, this is a safe procedure. However, problems may occur, including:  Bleeding.  Injury to a nerve near the injection site.  Pain at the injection site.  Weakness or numbness in areas controlled by nerves near the injection site.  Infection.  Temporary fluid retention.  Allergic reactions to medicines or dyes.  Injury to other structures or organs near the injection site. What happens before the procedure?  Follow instructions from your health care  provider about eating or drinking restrictions.  Ask your health care provider about:  Changing or stopping your regular medicines. This is especially important if you are taking diabetes medicines or blood thinners.  Taking medicines such as aspirin and ibuprofen. These medicines can thin your blood. Do not take these medicines before your procedure if your health care provider instructs you not to.  Do not take any new dietary supplements or  medicines without asking your health care provider first.  Plan to have someone take you home after the procedure. What happens during the procedure?  You may need to remove your clothing and dress in an open-back gown.  The procedure will be done while you are lying on an X-ray table. You will most likely be asked to lie on your stomach, but you may be asked to lie in a different position if an injection will be made in your neck.  Machines will be used to monitor your oxygen levels, heart rate, and blood pressure.  If an injection will be made in your neck, an IV tube will be inserted into one of your veins. Fluids and medicine will flow directly into your body through the IV tube.  The area over the facet joint where the injection will be made will be cleaned with soap. The surrounding skin will be covered with clean drapes.  A numbing medicine (local anesthetic) will be applied to your skin. Your skin may sting or burn for a moment.  A video X-ray machine (fluoroscopy) will be used to locate the joint. In some cases, a CT scan may be used.  A contrast dye may be injected into the facet joint area to help locate the joint.  When the joint is located, an anesthetic will be injected into the joint through the needle.  Your health care provider will ask you whether you feel pain relief. If you do feel relief, a steroid may be injected to provide pain relief for a longer period of time. If you do not feel relief or feel only partial relief, additional injections of an anesthetic may be made in other facet joints.  The needle will be removed.  Your skin will be cleaned.  A bandage (dressing) will be applied over each injection site. The procedure may vary among health care providers and hospitals. What happens after the procedure?  You will be observed for 15-30 minutes before being allowed to go home. This information is not intended to replace advice given to you by your health care  provider. Make sure you discuss any questions you have with your health care provider. Document Released: 09/26/2006 Document Revised: 06/08/2015 Document Reviewed: 01/31/2015 Elsevier Interactive Patient Education  2017 [employer] Refer to this sheet in the next few weeks. These instructions provide you with information about caring for yourself after your procedure. Your health care provider may also give you more specific instructions. Your treatment has been planned according to current medical practices, but problems sometimes occur. Call your health care provider if you have any problems or questions after your procedure. What can I expect after the procedure? After the procedure, it is common to have:  Some tenderness over the injection sites for 2 days after the procedure.  A temporary increase in blood sugar if you have diabetes. Follow these instructions at home:  Keep track of the amount of pain relief you feel and how long it lasts.  Take over-the-counter and prescription medicines only as  told by your health care provider. You may need to limit pain medicine within the first 4-6 hours after the procedure.  Remove your bandages (dressings) the morning after the procedure.  For the first 24 hours after the procedure:  Do not apply heat near or over the injection sites.  Do not take a bath or soak in water, such as in a pool or lake.  Do not drive or operate heavy machinery unless approved by your health care provider.  Avoid activities that require a lot of energy.  If the injection site is tender, try applying ice to the area. To do this:  Put ice in a plastic bag.  Place a towel between your skin and the bag.  Leave the ice on for 20 minutes, 2-3 times a day.  Keep all follow-up visits as told by your health care provider. This is important. Contact a health care provider if:  Fluid is coming from an injection site.  There is  significant bleeding or swelling at an injection site.  You have diabetes and your blood sugar is above 180 mg/dL. Get help right away if:  You have a fever.  You have worsening pain or swelling around an injection site.  There are red streaks around an injection site.  You develop severe pain that is not controlled by your medicines.  You develop a headache, stiff neck, nausea, or vomiting.  Your eyes become very sensitive to light.  You have weakness, paralysis, or tingling in your arms or legs that was not present before the procedure.  You have difficulty urinating or breathing. This information is not intended to replace advice given to you by your health care provider. Make sure you discuss any questions you have with your health care provider. Document Released: 04/23/2012 Document Revised: 09/21/2015 Document Reviewed: 01/31/2015 Elsevier Interactive Patient Education  2017 [employer].

## 2016-08-23 NOTE — Telephone Encounter (Signed)
Patient came to the office today had the empty bottle for percocet 7.5-325mg ;; after reviewing last visit... There may be a missing rx or patient may have not received the script from the last visit. Dr Andree Elk gave a new script today for percocet 7.5/325mg  , 90 tablets, patient to take three tablets per day. Also, patient is due back for a facet procedure on the 17th of April.

## 2016-08-23 NOTE — Progress Notes (Signed)
Nursing Pain Medication Assessment:  Safety precautions to be maintained throughout the outpatient stay will include: orient to surroundings, keep bed in low position, maintain call bell within reach at all times, provide assistance with transfer out of bed and ambulation.  Medication Inspection Compliance: Pill count conducted under aseptic conditions, in front of the patient. Neither the pills nor the bottle was removed from the patient's sight at any time. Once count was completed pills were immediately returned to the patient in their original bottle.  Medication: Oxycodone/APAP Pill/Patch Count: 0 of 90 patches remain Pill/Patch Appearance: Markings consistent with prescribed medication Bottle Appearance: Standard pharmacy container. Clearly labeled. Filled Date: 02 /28 / 2018 Last Medication intake:  Today

## 2016-08-27 ENCOUNTER — Ambulatory Visit (INDEPENDENT_AMBULATORY_CARE_PROVIDER_SITE_OTHER): Payer: PPO | Admitting: Family Medicine

## 2016-08-27 ENCOUNTER — Encounter: Payer: Self-pay | Admitting: Family Medicine

## 2016-08-27 VITALS — BP 110/66 | HR 81 | Temp 98.0°F | Wt 188.0 lb

## 2016-08-27 DIAGNOSIS — R197 Diarrhea, unspecified: Secondary | ICD-10-CM

## 2016-08-27 LAB — BASIC METABOLIC PANEL
BUN: 26 mg/dL — ABNORMAL HIGH (ref 6–23)
CO2: 29 mEq/L (ref 19–32)
Calcium: 10 mg/dL (ref 8.4–10.5)
Chloride: 96 mEq/L (ref 96–112)
Creatinine, Ser: 1.24 mg/dL (ref 0.40–1.50)
GFR: 60.15 mL/min (ref >60.00)
Glucose, Bld: 120 mg/dL — ABNORMAL HIGH (ref 70–99)
Potassium: 4.6 mEq/L (ref 3.5–5.1)
Sodium: 135 mEq/L (ref 135–145)

## 2016-08-27 NOTE — Progress Notes (Signed)
  Tommi Rumps, MD Phone: [phone]  James Hood is a 76 y.o. male who presents today for same-day visit.  Patient notes worsening diarrhea over the last week. He was seen several weeks ago for diarrhea following flu illness. Noted at that time to have been improving though his symptoms have worsened. He notes no abdominal pain, blood in stool, nausea, or vomiting. He just does not have an appetite. He notes no fevers. 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 His sodium was minimally low previously. That did improve with improvement in his symptoms and holding his hydrochlorothiazide. He has restarted back on this after he started to feel somewhat better. He does report he lost his balance this morning and fell back into the bushes. He notes no lightheadedness or headache injury. No loss of consciousness. Feels as though he bruised his left leg. He had no other symptoms other than that he just fell back and the blush.  PMH: Smoker   ROS see history of present illness  Objective  Physical Exam Vitals:   08/27/16 1040  BP: 110/66  Pulse: 81  Temp: 98 F (36.7 C)   Laying blood pressure 112/66 pulse 82 Sitting blood pressure 108/68 pulse 86 Standing blood pressure 98/62 pulse 91  BP Readings from Last 3 Encounters:  08/27/16 110/66  08/23/16 108/63  08/16/16 124/72   Wt Readings from Last 3 Encounters:  08/27/16 188 lb (85.3 kg)  08/23/16 194 lb (88 kg)  08/16/16 189 lb 12.8 oz (86.1 kg)    Physical Exam  Constitutional: No distress.  HENT:  Head: Normocephalic and atraumatic.  Mouth/Throat: Oropharynx is clear and moist. No oropharyngeal exudate.  Cardiovascular: Normal rate, regular rhythm and normal heart sounds.   Pulmonary/Chest: Effort normal and breath sounds normal.  Abdominal: Soft. Bowel sounds are normal. He exhibits no distension. There is no tenderness. There is no rebound and no guarding.  Musculoskeletal: He exhibits no edema.    Neurological: He is alert.  Skin: Skin is warm and dry. He is not diaphoretic.  Left leg upper thigh with no tenderness or bony defects noted, no bruising   Assessment/Plan: Please see individual problem list.  Diarrhea Patient with possible acute on chronic diarrhea. Has worsened over the last week. Imbalance would be concerning for orthostasis/dehydration versus hyponatremia. He does not appear dehydrated on physical exam. His orthostatics are not positive. Could be postinfectious IBS versus infection. Encouraged hydration. We'll have him hold his hydrochlorothiazide/lisinopril. We'll check a BMP. We'll obtain stool studies. He is given return precautions.   Orders Placed This Encounter  Procedures  . Stool culture    Standing Status:   Future    Number of Occurrences:   1    Standing Expiration Date:   08/27/2017  . Ova and Parasite Examination    Standing Status:   Future    Number of Occurrences:   1    Standing Expiration Date:   08/27/2017  . Basic Metabolic Panel (BMET)  . C. difficile GDH and Toxin A/B    Standing Status:   Future    Number of Occurrences:   1    Standing Expiration Date:   08/27/2017    Tommi Rumps, MD [place]

## 2016-08-27 NOTE — Assessment & Plan Note (Addendum)
Patient with possible acute on chronic diarrhea. Has worsened over the last week. Imbalance would be concerning for orthostasis/dehydration versus hyponatremia. He does not appear dehydrated on physical exam. His orthostatics are not positive. Could be postinfectious IBS versus infection. Encouraged hydration. We'll have him hold his hydrochlorothiazide/lisinopril. We'll check a BMP. We'll obtain stool studies. He is given return precautions.

## 2016-08-27 NOTE — Patient Instructions (Signed)
Nice to see you. We'll check some stool studies and check your sodium. Please try to stay well hydrated. Please hold the hydrochlorothiazide/lisinopril until your symptoms are improving. Please monitor your blood pressure home. If you develop abdominal pain, blood in your stool, worsening balance issues, or any new or changing symptoms please seek medical attention immediately.

## 2016-08-27 NOTE — Progress Notes (Signed)
Pre visit review using our clinic review tool, if applicable. No additional management support is needed unless otherwise documented below in the visit note. 

## 2016-08-28 ENCOUNTER — Telehealth: Payer: Self-pay | Admitting: Family Medicine

## 2016-08-28 LAB — C. DIFFICILE GDH AND TOXIN A/B
C. difficile GDH: NOT DETECTED
C. difficile Toxin A/B: NOT DETECTED

## 2016-08-28 LAB — OVA AND PARASITE EXAMINATION: OP: NONE SEEN

## 2016-08-28 NOTE — Telephone Encounter (Signed)
Please advise 

## 2016-08-28 NOTE — Telephone Encounter (Signed)
Pt called back looking for lab results. Please advise, thank you!  Call pt @ [phone]

## 2016-08-28 NOTE — Telephone Encounter (Signed)
Patient notified

## 2016-08-28 NOTE — Telephone Encounter (Signed)
Patients kidney function is minimally worse than previous and his sodium is in the normal range. One of his stool studies was negative and we will call with the other results. They should continue to hold his HCTZ until his diarrhea stops and they should monitor his BP at home. Thanks.

## 2016-08-29 ENCOUNTER — Telehealth: Payer: Self-pay | Admitting: Family Medicine

## 2016-08-29 NOTE — Telephone Encounter (Signed)
Patient states he has been taking this for weeks, he states he is taking 16-20 pills a day please advise

## 2016-08-29 NOTE — Telephone Encounter (Signed)
Patient is scheduled with gastroenterology tomorrow 08/30/16 2pm

## 2016-08-29 NOTE — Telephone Encounter (Signed)
Pt called and stated that he would like something to help with the diarrhea. He states that it is becoming very urgent and he is feeling weak. Please advise, thank you!  Call pt @ [phone]

## 2016-08-29 NOTE — Telephone Encounter (Signed)
He may trial imodium over the counter very judiciously to see if that is beneficial. We are still awaiting his stool culture and will contact him with the results. Thanks.

## 2016-08-29 NOTE — Telephone Encounter (Signed)
Noted  

## 2016-08-29 NOTE — Telephone Encounter (Signed)
He should discontinue the Imodium. He is taking entirely too much. Please see if he has been taking Lomotil. Given his persistent issues with this I would advise GI evaluation. Thanks.

## 2016-08-29 NOTE — Telephone Encounter (Signed)
Please advise 

## 2016-08-30 DIAGNOSIS — K580 Irritable bowel syndrome with diarrhea: Secondary | ICD-10-CM | POA: Diagnosis not present

## 2016-08-30 DIAGNOSIS — K2270 Barrett's esophagus without dysplasia: Secondary | ICD-10-CM | POA: Diagnosis not present

## 2016-08-31 LAB — STOOL CULTURE

## 2016-09-04 ENCOUNTER — Other Ambulatory Visit
Admission: RE | Admit: 2016-09-04 | Discharge: 2016-09-04 | Disposition: A | Discharge status: Home / Self Care | Payer: PPO | Source: Ambulatory Visit | Attending: Gastroenterology | Admitting: Gastroenterology

## 2016-09-04 ENCOUNTER — Ambulatory Visit
Admission: RE | Admit: 2016-09-04 | Discharge: 2016-09-04 | Disposition: A | Discharge status: Home / Self Care | Payer: PPO | Source: Ambulatory Visit | Attending: Anesthesiology | Admitting: Anesthesiology

## 2016-09-04 ENCOUNTER — Other Ambulatory Visit: Payer: Self-pay | Admitting: Anesthesiology

## 2016-09-04 ENCOUNTER — Encounter: Payer: Self-pay | Admitting: Anesthesiology

## 2016-09-04 ENCOUNTER — Ambulatory Visit (HOSPITAL_BASED_OUTPATIENT_CLINIC_OR_DEPARTMENT_OTHER): Payer: PPO | Admitting: Anesthesiology

## 2016-09-04 DIAGNOSIS — M4696 Unspecified inflammatory spondylopathy, lumbar region: Secondary | ICD-10-CM

## 2016-09-04 DIAGNOSIS — R52 Pain, unspecified: Secondary | ICD-10-CM

## 2016-09-04 DIAGNOSIS — M47816 Spondylosis without myelopathy or radiculopathy, lumbar region: Secondary | ICD-10-CM

## 2016-09-04 LAB — GASTROINTESTINAL PANEL BY PCR, STOOL (REPLACES STOOL CULTURE)

## 2016-09-04 MED ORDER — SODIUM CHLORIDE 0.9 % IJ SOLN
INTRAMUSCULAR | Status: AC
  Filled 2016-09-04: qty 10

## 2016-09-04 MED ORDER — LACTATED RINGERS IV SOLN
1000.0000 mL | INTRAVENOUS | Status: DC
  Administered 2016-09-04: 1000 mL via INTRAVENOUS

## 2016-09-04 MED ORDER — ROPIVACAINE HCL 2 MG/ML IJ SOLN
10.0000 mL | Freq: Once | INTRAMUSCULAR | Status: AC
  Administered 2016-09-04: 9 mL via EPIDURAL

## 2016-09-04 MED ORDER — MIDAZOLAM HCL 5 MG/5ML IJ SOLN
5.0000 mg | Freq: Once | INTRAMUSCULAR | Status: AC
  Administered 2016-09-04: 2 mg via INTRAVENOUS

## 2016-09-04 MED ORDER — FENTANYL CITRATE (PF) 100 MCG/2ML IJ SOLN: 100.0000 ug | Freq: Once | INTRAMUSCULAR | Status: DC

## 2016-09-04 MED ORDER — SODIUM CHLORIDE 0.9% FLUSH: 10.0000 mL | Freq: Once | INTRAVENOUS | Status: DC

## 2016-09-04 MED ORDER — OXYCODONE-ACETAMINOPHEN 7.5-325 MG PO TABS: 1.0000 | ORAL_TABLET | Freq: Three times a day (TID) | ORAL | 0 refills | Status: DC

## 2016-09-04 MED ORDER — LIDOCAINE HCL (PF) 1 % IJ SOLN
INTRAMUSCULAR | Status: AC
  Filled 2016-09-04: qty 5

## 2016-09-04 MED ORDER — MIDAZOLAM HCL 5 MG/5ML IJ SOLN
INTRAMUSCULAR | Status: AC
  Filled 2016-09-04: qty 5

## 2016-09-04 MED ORDER — ROPIVACAINE HCL 2 MG/ML IJ SOLN
INTRAMUSCULAR | Status: AC
  Filled 2016-09-04: qty 10

## 2016-09-04 MED ORDER — TRIAMCINOLONE ACETONIDE 40 MG/ML IJ SUSP
40.0000 mg | Freq: Once | INTRAMUSCULAR | Status: AC
  Administered 2016-09-04: 40 mg
  Filled 2016-09-04: qty 1

## 2016-09-04 MED ORDER — LIDOCAINE HCL (PF) 1 % IJ SOLN
5.0000 mL | Freq: Once | INTRAMUSCULAR | Status: AC
  Administered 2016-09-04: 5 mL via SUBCUTANEOUS

## 2016-09-04 NOTE — Progress Notes (Signed)
Nursing Pain Medication Assessment:  Safety precautions to be maintained throughout the outpatient stay will include: orient to surroundings, keep bed in low position, maintain call bell within reach at all times, provide assistance with transfer out of bed and ambulation.  Medication Inspection Compliance: James Hood did not comply with our request to bring his pills to be counted. He was reminded that bringing the medication bottles, even when empty, is a requirement. Pill/Patch Count: None available to be counted. Bottle Appearance: No container available. Did not bring bottle(s) to appointment. Medication: None brought in. Filled Date: N/A Last Medication intake:  Today   Patient reminded to bring pill bottle at next med refill appointment.

## 2016-09-04 NOTE — Patient Instructions (Signed)
Pain Management Discharge Instructions  General Discharge Instructions :  If you need to reach your doctor call: Monday-Friday 8:00 am - 4:00 pm at [phone] or toll free [phone].  After clinic hours [phone] to have operator reach doctor.  Bring all of your medication bottles to all your appointments in the pain clinic.  To cancel or reschedule your appointment with Pain Management please remember to call 24 hours in advance to avoid a fee.  Refer to the educational materials which you have been given on: General Risks, I had my Procedure. Discharge Instructions, Post Sedation.  Post Procedure Instructions:  The drugs you were given will stay in your system until tomorrow, so for the next 24 hours you should not drive, make any legal decisions or drink any alcoholic beverages.  You may eat anything you prefer, but it is better to start with liquids then soups and crackers, and gradually work up to solid foods.  Please notify your doctor immediately if you have any unusual bleeding, trouble breathing or pain that is not related to your normal pain.  Depending on the type of procedure that was done, some parts of your body may feel week and/or numb.  This usually clears up by tonight or the next day.  Walk with the use of an assistive device or accompanied by an adult for the 24 hours.  You may use ice on the affected area for the first 24 hours.  Put ice in a Ziploc bag and cover with a towel and place against area 15 minutes on 15 minutes off.  You may switch to heat after 24 hours.GENERAL RISKS AND COMPLICATIONS  What are the risk, side effects and possible complications? Generally speaking, most procedures are safe.  However, with any procedure there are risks, side effects, and the possibility of complications.  The risks and complications are dependent upon the sites that are lesioned, or the type of nerve block to be performed.  The closer the procedure is to the spine,  the more serious the risks are.  Great care is taken when placing the radio frequency needles, block needles or lesioning probes, but sometimes complications can occur. 1. Infection: Any time there is an injection through the skin, there is a risk of infection.  This is why sterile conditions are used for these blocks.  There are four possible types of infection. 1. Localized skin infection. 2. Central Nervous System Infection-This can be in the form of Meningitis, which can be deadly. 3. Epidural Infections-This can be in the form of an epidural abscess, which can cause pressure inside of the spine, causing compression of the spinal cord with subsequent paralysis. This would require an emergency surgery to decompress, and there are no guarantees that the patient would recover from the paralysis. 4. Discitis-This is an infection of the intervertebral discs.  It occurs in about 1% of discography procedures.  It is difficult to treat and it may lead to surgery.        2. Pain: the needles have to go through skin and soft tissues, will cause soreness.       3. Damage to internal structures:  The nerves to be lesioned may be near blood vessels or    other nerves which can be potentially damaged.       4. Bleeding: Bleeding is more common if the patient is taking blood thinners such as  aspirin, Coumadin, Ticiid, Plavix, etc., or if he/she have some genetic predisposition  such as   hemophilia. Bleeding into the spinal canal can cause compression of the spinal  cord with subsequent paralysis.  This would require an emergency surgery to  decompress and there are no guarantees that the patient would recover from the  paralysis.       5. Pneumothorax:  Puncturing of a lung is a possibility, every time a needle is introduced in  the area of the chest or upper back.  Pneumothorax refers to free air around the  collapsed lung(s), inside of the thoracic cavity (chest cavity).  Another two possible  complications  related to a similar event would include: Hemothorax and Chylothorax.   These are variations of the Pneumothorax, where instead of air around the collapsed  lung(s), you may have blood or chyle, respectively.       6. Spinal headaches: They may occur with any procedures in the area of the spine.       7. Persistent CSF (Cerebro-Spinal Fluid) leakage: This is a rare problem, but may occur  with prolonged intrathecal or epidural catheters either due to the formation of a fistulous  track or a dural tear.       8. Nerve damage: By working so close to the spinal cord, there is always a possibility of  nerve damage, which could be as serious as a permanent spinal cord injury with  paralysis.       9. Death:  Although rare, severe deadly allergic reactions known as "Anaphylactic  reaction" can occur to any of the medications used.      10. Worsening of the symptoms:  We can always make thing worse.  What are the chances of something like this happening? Chances of any of this occuring are extremely low.  By statistics, you have more of a chance of getting killed in a motor vehicle accident: while driving to the hospital than any of the above occurring .  Nevertheless, you should be aware that they are possibilities.  In general, it is similar to taking a shower.  Everybody knows that you can slip, hit your head and get killed.  Does that mean that you should not shower again?  Nevertheless always keep in mind that statistics do not mean anything if you happen to be on the wrong side of them.  Even if a procedure has a 1 (one) in a 1,000,000 (million) chance of going wrong, it you happen to be that one..Also, keep in mind that by statistics, you have more of a chance of having something go wrong when taking medications.  Who should not have this procedure? If you are on a blood thinning medication (e.g. Coumadin, Plavix, see list of "Blood Thinners"), or if you have an active infection going on, you should not  have the procedure.  If you are taking any blood thinners, please inform your physician.  How should I prepare for this procedure?  Do not eat or drink anything at least six hours prior to the procedure.  Bring a driver with you .  It cannot be a taxi.  Come accompanied by an adult that can drive you back, and that is strong enough to help you if your legs get weak or numb from the local anesthetic.  Take all of your medicines the morning of the procedure with just enough water to swallow them.  If you have diabetes, make sure that you are scheduled to have your procedure done first thing in the morning, whenever possible.  If you have diabetes,   take only half of your insulin dose and notify our nurse that you have done so as soon as you arrive at the clinic.  If you are diabetic, but only take blood sugar pills (oral hypoglycemic), then do not take them on the morning of your procedure.  You may take them after you have had the procedure.  Do not take aspirin or any aspirin-containing medications, at least eleven (11) days prior to the procedure.  They may prolong bleeding.  Wear loose fitting clothing that may be easy to take off and that you would not mind if it got stained with Betadine or blood.  Do not wear any jewelry or perfume  Remove any nail coloring.  It will interfere with some of our monitoring equipment.  NOTE: Remember that this is not meant to be interpreted as a complete list of all possible complications.  Unforeseen problems may occur.  BLOOD THINNERS The following drugs contain aspirin or other products, which can cause increased bleeding during surgery and should not be taken for 2 weeks prior to and 1 week after surgery.  If you should need take something for relief of minor pain, you may take acetaminophen which is found in Tylenol,m Datril, Anacin-3 and Panadol. It is not blood thinner. The products listed below are.  Do not take any of the products listed below  in addition to any listed on your instruction sheet.  A.P.C or A.P.C with Codeine Codeine Phosphate Capsules #3 Ibuprofen Ridaura  ABC compound Congesprin Imuran rimadil  Advil Cope Indocin Robaxisal  Alka-Seltzer Effervescent Pain Reliever and Antacid Coricidin or Coricidin-D  Indomethacin Rufen  Alka-Seltzer plus Cold Medicine Cosprin Ketoprofen S-A-C Tablets  Anacin Analgesic Tablets or Capsules Coumadin Korlgesic Salflex  Anacin Extra Strength Analgesic tablets or capsules CP-2 Tablets Lanoril Salicylate  Anaprox Cuprimine Capsules Levenox Salocol  Anexsia-D Dalteparin Magan Salsalate  Anodynos Darvon compound Magnesium Salicylate Sine-off  Ansaid Dasin Capsules Magsal Sodium Salicylate  Anturane Depen Capsules Marnal Soma  APF Arthritis pain formula Dewitt's Pills Measurin Stanback  Argesic Dia-Gesic Meclofenamic Sulfinpyrazone  Arthritis Bayer Timed Release Aspirin Diclofenac Meclomen Sulindac  Arthritis pain formula Anacin Dicumarol Medipren Supac  Analgesic (Safety coated) Arthralgen Diffunasal Mefanamic Suprofen  Arthritis Strength Bufferin Dihydrocodeine Mepro Compound Suprol  Arthropan liquid Dopirydamole Methcarbomol with Aspirin Synalgos  ASA tablets/Enseals Disalcid Micrainin Tagament  Ascriptin Doan's Midol Talwin  Ascriptin A/D Dolene Mobidin Tanderil  Ascriptin Extra Strength Dolobid Moblgesic Ticlid  Ascriptin with Codeine Doloprin or Doloprin with Codeine Momentum Tolectin  Asperbuf Duoprin Mono-gesic Trendar  Aspergum Duradyne Motrin or Motrin IB Triminicin  Aspirin plain, buffered or enteric coated Durasal Myochrisine Trigesic  Aspirin Suppositories Easprin Nalfon Trillsate  Aspirin with Codeine Ecotrin Regular or Extra Strength Naprosyn Uracel  Atromid-S Efficin Naproxen Ursinus  Auranofin Capsules Elmiron Neocylate Vanquish  Axotal Emagrin Norgesic Verin  Azathioprine Empirin or Empirin with Codeine Normiflo Vitamin E  Azolid Emprazil Nuprin Voltaren  Bayer  Aspirin plain, buffered or children's or timed BC Tablets or powders Encaprin Orgaran Warfarin Sodium  Buff-a-Comp Enoxaparin Orudis Zorpin  Buff-a-Comp with Codeine Equegesic Os-Cal-Gesic   Buffaprin Excedrin plain, buffered or Extra Strength Oxalid   Bufferin Arthritis Strength Feldene Oxphenbutazone   Bufferin plain or Extra Strength Feldene Capsules Oxycodone with Aspirin   Bufferin with Codeine Fenoprofen Fenoprofen Pabalate or Pabalate-SF   Buffets II Flogesic Panagesic   Buffinol plain or Extra Strength Florinal or Florinal with Codeine Panwarfarin   Buf-Tabs Flurbiprofen Penicillamine   Butalbital Compound Four-way cold tablets   Penicillin   Butazolidin Fragmin Pepto-Bismol   Carbenicillin Geminisyn Percodan   Carna Arthritis Reliever Geopen Persantine   Carprofen Gold's salt Persistin   Chloramphenicol Goody's Phenylbutazone   Chloromycetin Haltrain Piroxlcam   Clmetidine heparin Plaquenil   Cllnoril Hyco-pap Ponstel   Clofibrate Hydroxy chloroquine Propoxyphen         Before stopping any of these medications, be sure to consult the physician who ordered them.  Some, such as Coumadin (Warfarin) are ordered to prevent or treat serious conditions such as "deep thrombosis", "pumonary embolisms", and other heart problems.  The amount of time that you may need off of the medication may also vary with the medication and the reason for which you were taking it.  If you are taking any of these medications, please make sure you notify your pain physician before you undergo any procedures.         Facet Joint Block The facet joints connect the bones of the spine (vertebrae). They make it possible for you to bend, twist, and make other movements with your spine. They also keep you from bending too far, twisting too far, and making other excessive movements. A facet joint block is a procedure where a numbing medicine (anesthetic) is injected into a facet joint. Often, a type of  anti-inflammatory medicine called a steroid is also injected. A facet joint block may be done to diagnose neck or back pain. If the pain gets better after a facet joint block, it means the pain is probably coming from the facet joint. If the pain does not get better, it means the pain is probably not coming from the facet joint. A facet joint block may also be done to relieve neck or back pain caused by an inflamed facet joint. A facet joint block is only done to relieve pain if the pain does not improve with other methods, such as medicine, exercise programs, and physical therapy. Tell a health care provider about:  Any allergies you have.  All medicines you are taking, including vitamins, herbs, eye drops, creams, and over-the-counter medicines.  Any problems you or family members have had with anesthetic medicines.  Any blood disorders you have.  Any surgeries you have had.  Any medical conditions you have.  Whether you are pregnant or may be pregnant. What are the risks? Generally, this is a safe procedure. However, problems may occur, including:  Bleeding.  Injury to a nerve near the injection site.  Pain at the injection site.  Weakness or numbness in areas controlled by nerves near the injection site.  Infection.  Temporary fluid retention.  Allergic reactions to medicines or dyes.  Injury to other structures or organs near the injection site. What happens before the procedure?  Follow instructions from your health care provider about eating or drinking restrictions.  Ask your health care provider about:  Changing or stopping your regular medicines. This is especially important if you are taking diabetes medicines or blood thinners.  Taking medicines such as aspirin and ibuprofen. These medicines can thin your blood. Do not take these medicines before your procedure if your health care provider instructs you not to.  Do not take any new dietary supplements or  medicines without asking your health care provider first.  Plan to have someone take you home after the procedure. What happens during the procedure?  You may need to remove your clothing and dress in an open-back gown.  The procedure will be done while you are lying on   an X-ray table. You will most likely be asked to lie on your stomach, but you may be asked to lie in a different position if an injection will be made in your neck.  Machines will be used to monitor your oxygen levels, heart rate, and blood pressure.  If an injection will be made in your neck, an IV tube will be inserted into one of your veins. Fluids and medicine will flow directly into your body through the IV tube.  The area over the facet joint where the injection will be made will be cleaned with soap. The surrounding skin will be covered with clean drapes.  A numbing medicine (local anesthetic) will be applied to your skin. Your skin may sting or burn for a moment.  A video X-ray machine (fluoroscopy) will be used to locate the joint. In some cases, a CT scan may be used.  A contrast dye may be injected into the facet joint area to help locate the joint.  When the joint is located, an anesthetic will be injected into the joint through the needle.  Your health care provider will ask you whether you feel pain relief. If you do feel relief, a steroid may be injected to provide pain relief for a longer period of time. If you do not feel relief or feel only partial relief, additional injections of an anesthetic may be made in other facet joints.  The needle will be removed.  Your skin will be cleaned.  A bandage (dressing) will be applied over each injection site. The procedure may vary among health care providers and hospitals. What happens after the procedure?  You will be observed for 15-30 minutes before being allowed to go home. This information is not intended to replace advice given to you by your health care  provider. Make sure you discuss any questions you have with your health care provider. Document Released: 09/26/2006 Document Revised: 06/08/2015 Document Reviewed: 01/31/2015 [employer] Interactive Patient Education  2017 [employer] Facet Joint Block, Care After Refer to this sheet in the next few weeks. These instructions provide you with information about caring for yourself after your procedure. Your health care provider may also give you more specific instructions. Your treatment has been planned according to current medical practices, but problems sometimes occur. Call your health care provider if you have any problems or questions after your procedure. What can I expect after the procedure? After the procedure, it is common to have:  Some tenderness over the injection sites for 2 days after the procedure.  A temporary increase in blood sugar if you have diabetes. Follow these instructions at home:  Keep track of the amount of pain relief you feel and how long it lasts.  Take over-the-counter and prescription medicines only as told by your health care provider. You may need to limit pain medicine within the first 4-6 hours after the procedure.  Remove your bandages (dressings) the morning after the procedure.  For the first 24 hours after the procedure:  Do not apply heat near or over the injection sites.  Do not take a bath or soak in water, such as in a pool or lake.  Do not drive or operate heavy machinery unless approved by your health care provider.  Avoid activities that require a lot of energy.  If the injection site is tender, try applying ice to the area. To do this:  Put ice in a plastic bag.  Place a towel between your skin and   the bag.  Leave the ice on for 20 minutes, 2-3 times a day.  Keep all follow-up visits as told by your health care provider. This is important. Contact a health care provider if:  Fluid is coming from an injection site.  There is  significant bleeding or swelling at an injection site.  You have diabetes and your blood sugar is above 180 mg/dL. Get help right away if:  You have a fever.  You have worsening pain or swelling around an injection site.  There are red streaks around an injection site.  You develop severe pain that is not controlled by your medicines.  You develop a headache, stiff neck, nausea, or vomiting.  Your eyes become very sensitive to light.  You have weakness, paralysis, or tingling in your arms or legs that was not present before the procedure.  You have difficulty urinating or breathing. This information is not intended to replace advice given to you by your health care provider. Make sure you discuss any questions you have with your health care provider. Document Released: 04/23/2012 Document Revised: 09/21/2015 Document Reviewed: 01/31/2015 [employer] Interactive Patient Education  2017 [employer]  

## 2016-09-04 NOTE — Progress Notes (Signed)
Subjective:  Patient ID: James Hood, male    DOB: 1940/06/03  Age: 76 y.o. MRN: [id number]  CC: Ankle Pain (left) and Hip Pain (left)   Service Provided on Last Visit: Procedure Procedure: Left lumbar facet block via medial branch under fluoroscopic guidance with moderate sedation Previous Procedure:  None Previous PROCEDURE:Caudal epidural steroid No. 2 under fluoroscopic guidance with moderate sedation  HPI James Hood resents for reevaluation. He still having left lower back pain of the same quality characteristic and distribution as previously documented at his last visit. This primarily worse in the mornings and evenings worse with prolonged standing and twisting. Otherwise his strength and lower extremity function have remained stable. Bowel bladder has been stable. He is taking his medications as prescribed and they have been working well with minimal untoward side effects.  By history  He is a pleasant 76 year old white male with a long-standing history of low back pain for over one year. He reports that he had a laminectomy in October of this year with Dr. Joya Salm. He also states that he has continued to have lancinating left posterior and lateral leg pain described as sharp nagging pulsating and throbbing it's present throughout much of the day. He rates this as a VAS of 7 best a 2 and now a 5. He feels that it is gotten worse. He has some chronic intermittent weakness of the left leg. He had an MRI that was reviewed with him today that is in follow-up to his October surgery. He also reports having had 3 epidural steroids prior to his surgery and 1 L5-S1 epidural after this. The pain is worse with bending climbing motion squatting and better with rest sleep. 5 mg of oxycodone does not give him much relief. We have reviewed his physician database and there are no concerns. He also takes Neurontin 1200 mg 3 times a day.  History Taishaun has a past medical history of Anxiety;  Anxiety and depression; Arthritis; Colon polyps; Dementia; Depression; Diabetes mellitus type 2 in nonobese [facility]); GERD (gastroesophageal reflux disease); Headache; 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[place]); History of hiatal hernia; Hypercholesteremia; Hypertension; Hyperthyroidism; Neuropathic pain; Primary localized osteoarthritis of right knee; Stomach ulcer; and Tremor, essential.   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   His family history includes Depression in his father; Heart attack in his mother; Heart disease in his mother; Hypertension in his father.He reports that he quit smoking about 2 weeks ago. His smoking use included Cigarettes. He has a 60.00 pack-year smoking history. He has never used smokeless tobacco. He reports that he does not drink alcohol or use drugs.  No results found for this or any previous visit.  [employer] Select 13  Date Value Ref Range Status  05/03/2016 FINAL  Final    Comment:    ==================================================================== TOX[employer] SELECT 13 (MW) ==================================================================== Test                             Result       Flag       Units Drug Present and Declared for Prescription Verification   Oxycodone                      140          EXPECTED   ng/mg creat   Oxymorphone  127          EXPECTED   ng/mg creat   Noroxycodone                   903          EXPECTED   ng/mg creat   Noroxymorphone                 71           EXPECTED   ng/mg creat    Sources of oxycodone are scheduled prescription medications.    Oxymorphone, noroxycodone, and noroxymorphone are expected    metabolites of oxycodone. Oxymorphone is also available as a    scheduled prescription medication.   Phenobarbital                  PRESENT       EXPECTED    Phenobarbital is an expected metabolite of primidone;    Phenobarbital may also be administered as a prescription drug. Drug Absent but Declared for Prescription Verification   Desmethyldiazepam              Not Detected UNEXPECTED ng/mg creat    Desmethyldiazepam is an expected metabolite of chlordiazepoxide,    clorazepate, halazepam, and prazepam.   Hydrocodone                    Not Detected UNEXPECTED ng/mg creat   Tramadol                       Not Detected UNEXPECTED ==================================================================== Test                      Result    Flag   Units      Ref Range   Creatinine              75               mg/dL      >=20 ==================================================================== Declared Medications:  The flagging and interpretation on this report are based on the  following declared medications.  Unexpected results may arise from  inaccuracies in the declared medications.  **Note: The testing scope of this panel includes these medications:  Clorazepate (Tranxene)  Hydrocodone (Norco)  Oxycodone  Oxycodone (Percocet)  Primidone (Mysoline)  Tramadol (Ultram)  **Note: The testing scope of this panel does not include following  reported medications:  Acetaminophen (Norco)  Acetaminophen (Percocet)  Acetaminophen (Tylenol)  Aspirin  Atropine (Lomotil)  Bupropion (Zyban)  Colestipol  Cyclobenzaprine (Flexeril)  Diphenoxylate (Lomotil)  Divalproex (Depakote)  Donepezil (Aricept)  Fenofibrate (Tricor)  Finasteride (Proscar)  Gabapentin (Neurontin)  Glimepiride (Amaryl)  Hydrochlorothiazide (Prinzide)  Hydrochlorothiazide (Zestoretic)  Lisinopril (Prinzide)  Lisinopril (Zestoretic)  Memantine (Namenda)  Metformin (Glucophage)  Metoprolol (Lopressor)  Metoprolol (Toprol)  Omeprazole (Prilosec)  Prednisone (Sterapred)  Propranolol (Inderal)  Quetiapine (Seroquel)  Simvastatin (Zocor)  Tamsulosin  (Flomax) ==================================================================== For clinical consultation, please call [phone]. ====================================================================     Outpatient Medications Prior to Visit  Medication Sig Dispense Refill  . acetaminophen (TYLENOL) 500 MG tablet Take 1,000 mg by mouth as needed.     Marland Kitchen albuterol (PROVENTIL HFA;VENTOLIN HFA) 108 (90 Base) MCG/ACT inhaler Inhale 2 puffs into the lungs every 6 (six) hours as needed for wheezing or shortness of breath. 1 Inhaler 2  . aspirin EC 81 MG tablet Take 81 mg by mouth every morning.     Marland Kitchen  buPROPion (ZYBAN) 150 MG 12 hr tablet Take 1 tablet (150 mg total) by mouth 2 (two) times daily. 180 tablet 1  . colestipol (COLESTID) 1 g tablet Take 2 g by mouth daily.     . divalproex (DEPAKOTE ER) 250 MG 24 hr tablet Take 250 mg by mouth 2 (two) times daily. Pt takes 250mg  in the morning, and 250mg  in the afternoon    . donepezil (ARICEPT) 10 MG tablet Take 10 mg by mouth at bedtime.    . fenofibrate (TRICOR) 145 MG tablet TAKE ONE TABLET BY MOUTH DAILY 90 tablet 0  . finasteride (PROSCAR) 5 MG tablet Take 1 tablet (5 mg total) by mouth daily. 30 tablet 11  . gabapentin (NEURONTIN) 600 MG tablet Take 2 tablets (1,200 mg total) by mouth 3 (three) times daily. 180 tablet 3  . glimepiride (AMARYL) 4 MG tablet Take 1 tablet (4 mg total) by mouth 2 (two) times daily. 180 tablet 3  . lisinopril-hydrochlorothiazide (PRINZIDE,ZESTORETIC) 20-25 MG tablet Take 1 tablet by mouth 2 (two) times daily. 180 tablet 2  . memantine (NAMENDA) 10 MG tablet Take 10 mg by mouth 2 (two) times daily.     . metFORMIN (GLUCOPHAGE) 500 MG tablet TAKE TWO TABLETS BY MOUTH TWICE A DAY WITH A MEAL 180 tablet 2  . metoprolol succinate (TOPROL-XL) 25 MG 24 hr tablet Take 25 mg by mouth daily.     Marland Kitchen omeprazole (PRILOSEC) 20 MG capsule 20 mg 2 (two) times daily before a meal.     . potassium chloride SA (K-DUR,KLOR-CON) 20 MEQ  tablet Take 2 tablets (40 mEq total) by mouth daily. 6 tablet 0  . primidone (MYSOLINE) 50 MG tablet TAKE 5 TABLETS IN THE MORNING AND 3 TABLETS IN THE EVENING    . propranolol (INDERAL) 60 MG tablet Take 60 mg by mouth 2 (two) times daily.     . QUEtiapine (SEROQUEL) 25 MG tablet TAKE THREE TO FOUR TABLETS BY MOUTH AT NIGHT    . simvastatin (ZOCOR) 40 MG tablet Take 1 tablet (40 mg total) by mouth daily. 90 tablet 1  . sitaGLIPtin (JANUVIA) 100 MG tablet Take 1 tablet (100 mg total) by mouth daily. 30 tablet 3  . tamsulosin (FLOMAX) 0.4 MG CAPS capsule Take 0.4 mg by mouth daily after supper.     Marland Kitchen oxyCODONE-acetaminophen (PERCOCET) 7.5-325 MG tablet Take 1 tablet by mouth 3 (three) times daily. 90 tablet 0  . diphenoxylate-atropine (LOMOTIL) 2.5-0.025 MG tablet Take 1-2 tablets by mouth 2 (two) times daily. Take 2 tablets in the morning, and 1 tablet in the evening     No facility-administered medications prior to visit.    Lab Results  Component Value Date   WBC 6.4 08/10/2016   HGB 10.8 (L) 08/10/2016   HCT 31.2 (L) 08/10/2016   PLT 219 08/10/2016   GLUCOSE 120 (H) 08/27/2016   CHOL 161 09/16/2015   TRIG 393.0 (H) 09/16/2015   HDL 38.50 (L) 09/16/2015   LDLDIRECT 66.0 09/16/2015   ALT 36 08/14/2016   AST 51 (H) 08/14/2016   NA 135 08/27/2016   K 4.6 08/27/2016   CL 96 08/27/2016   CREATININE 1.24 08/27/2016   BUN 26 (H) 08/27/2016   CO2 29 08/27/2016   TSH 0.490 08/09/2016   PSA 0.07 (L) 09/16/2015   INR 1.08 11/05/2014   HGBA1C 7.7 (H) 06/13/2016    --------------------------------------------------------------------------------------------------------------------- Mr Lumbar Spine Wo Contrast  Result Date: 03/29/2016 CLINICAL DATA:  Initial evaluation for chronic low  back pain with left-sided sciatica, worsened. EXAM: MRI LUMBAR SPINE WITHOUT CONTRAST TECHNIQUE: Multiplanar, multisequence MR imaging of the lumbar spine was performed. No intravenous contrast was  administered. COMPARISON:  Prior MRI from 09/22/2015. FINDINGS: Segmentation: L normal segmentation. Lowest well-formed disc is labeled the L5-S1 level. Same numbering system is employed as on previous exams. Alignment: Straightening with slight reversal of the normal lumbar lordosis, stable. No listhesis. Vertebrae: Vertebral body heights are maintained. No evidence for acute or chronic fracture. Reactive endplate changes about the left aspect of the L5-S1 interspace, similar to previous. Postoperative changes from recent decompressive left hemi laminectomy with micro discectomy seen at L5-S1. No concerning features identified. Conus medullaris: Extends to the L1 level and appears normal. Paraspinal and other soft tissues: Normal expected postoperative no concerning features identified. Paraspinous soft tissues otherwise within normal limits. Visualized visceral structures are normal. No retroperitoneal adenopathy. Changes related to decompressive laminectomy present within the posterior paraspinous soft tissues of the lower back. Disc levels: L1-2: Degenerative disc desiccation with intervertebral disc space narrowing and mild disc bulge. No stenosis. L2-3: Degenerative disc desiccation with intervertebral disc space narrowing and mild disc bulge. No stenosis. L3-4: Degenerative disc desiccation with mild diffuse disc bulge. Mild facet hypertrophy. Mild right foraminal stenosis related to disc bulge and facet disease, stable. No canal or left foraminal narrowing. L4-5: Degenerative disc bulge with intervertebral disc space narrowing and disc desiccation. Disc bulging a centric to the right without focal disc protrusion. Superimposed bilateral facet arthrosis with ligamentum flavum hypertrophy. Reactive effusions within the bilateral L4-5 facets, stable. Minimal bilateral subarticular stenosis, slightly greater on the left. Mild bilateral foraminal narrowing related to disc bulge and facet disease, stable. L5-S1:  Postoperative changes from interval decompressive left hemi laminectomy with micro discectomy. Previously seen left subarticular disc protrusion has been largely resected, although there appears to be a small residual central disc protrusion that minimally indents the ventral thecal sac (series 6, image 28). Soft tissue density within the left lateral epidural space likely reflects postoperative granulation tissue, although evaluation somewhat limited due to lack of IV contrast. This surrounds the transiting S1 nerve root in the left lateral recess (series 7, image 28). No significant canal stenosis. Superimposed bilateral facet arthrosis with reactive effusions in the bilateral L5-S1 facets is similar to previous. Mild left foraminal narrowing is relatively unchanged. No significant right foraminal stenosis. IMPRESSION: 1. Postoperative changes from recent decompressive left hemi laminectomy with microdiskectomy at L5-S1. Postoperative granulation tissue within the left epidural space, surrounding the descending left S1 nerve root. No residual stenosis or concerning features identified. 2. 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 No other significant stenosis or evidence for impingement. Electronically Signed   By: Jeannine Boga M.D.   On: 03/29/2016 13:10       ---------------------------------------------------------------------------------------------------------------------- Past Medical History:  Diagnosis Date  . Anxiety   . Anxiety and depression   . Arthritis   . Colon polyps   . Dementia   . Depression   . Diabetes mellitus type 2 in nonobese (HCC)   . GERD (gastroesophageal reflux disease)   . Headache   . 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[place])   . History of hiatal hernia   . Hypercholesteremia   . Hypertension   . Hyperthyroidism   . Neuropathic pain   . Primary localized osteoarthritis of right knee   . Stomach ulcer   .  Tremor, essential     Past Surgical History:  Procedure Laterality Date  . esophageal stretch    . JOINT REPLACEMENT    . LUMBAR LAMINECTOMY/DECOMPRESSION MICRODISCECTOMY Left 02/03/2016   Procedure: LEFT L5-S1 DISKECTOMY;  Surgeon: Leeroy Cha, MD;  Location: [facility];  Service: Neurosurgery;  Laterality: Left;  LEFT L5-S1 DISKECTOMY  . TONSILLECTOMY    . TOTAL KNEE ARTHROPLASTY Right 11/15/2014   Procedure: TOTAL KNEE ARTHROPLASTY;  Surgeon: Elsie Saas, MD;  Location: [facility];  Service: Orthopedics;  Laterality: Right;    Family History  Problem Relation Age of Onset  . Heart attack Mother   . Heart disease Mother   . Hypertension Father   . Depression Father   . Kidney cancer Neg Hx   . Prostate cancer Neg Hx     Social History  Substance Use Topics  . Smoking status: Former Smoker    Packs/day: 1.00    Years: 60.00    Types: Cigarettes    Quit date: 08/2016  . Smokeless tobacco: Never Used  . 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 Marital status: Married    Spouse name: N/A  . Number of children: N/A  . Years of education: N/A   Social History Main Topics  . Smoking status: Former Smoker    Packs/day: 1.00    Years: 60.00    Types: Cigarettes    Quit date: 08/2016  . Smokeless tobacco: Never Used  . 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 Drug use: No  . Sexual activity: Not Currently   Other Topics Concern  . None   Social History Narrative  . None    Scheduled Meds: Continuous Infusions: PRN Meds:.   BP (!) 148/88   Pulse 94   Temp 98.7 F (37.1 C) (Oral)   Resp 16   Ht 5\' 11"  (1.803 m)   Wt 185 lb (83.9 kg)   SpO2 93%   BMI 25.80 kg/m    BP Readings from Last 3 Encounters:  09/04/16 (!) 148/88  08/27/16 110/66  08/23/16  108/63     Wt Readings from Last 3 Encounters:  09/04/16 185 lb (83.9 kg)  08/27/16 188 lb (85.3 kg)  08/23/16 194 lb (88 kg)     ----------------------------------------------------------------------------------------------------------------------  ROS Review of Systems  Cardiac: No angina or shortness of breath GI: No constipationOtherwise no change in review of systems.  Objective:  BP (!) 148/88   Pulse 94   Temp 98.7 F (37.1 C) (Oral)   Resp 16   Ht 5\' 11"  (1.803 m)   Wt 185 lb (83.9 kg)   SpO2 93%   BMI 25.80 kg/m   Physical Exam Patient is alert oriented cooperative compliant Lungs are clear to auscultation He still having pain with left lateral rotation and extension at the low back in the standing position. Strength appears to be at baseline.S    Assessment & Plan:   Dayn was seen today for ankle pain and hip pain.  Diagnoses and all orders for this visit:  Facet arthritis of lumbar region ([place]) -     LUMBAR FACET(MEDIAL BRANCH NERVE BLOCK) MBNB -     triamcinolone acetonide (KENALOG-40) injection 40 mg; 1 mL (40 mg total) by Other route once. -     sodium chloride flush (NS) 0.9 % injection 10 mL; 10 mLs by Other route once. -  ropivacaine (PF) 2 mg/mL (0.2%) (NAROPIN) injection 10 mL; 10 mLs by Epidural route once. -     midazolam (VERSED) 5 MG/5ML injection 5 mg; Inject 5 mLs (5 mg total) into the vein once. -     lidocaine (PF) (XYLOCAINE) 1 % injection 5 mL; Inject 5 mLs into the skin once. -     lactated ringers infusion 1,000 mL; Inject 1,000 mLs into the vein continuous. -     fentaNYL (SUBLIMAZE) injection 100 mcg; Inject 2 mLs (100 mcg total) into the vein once.  Other orders -     oxyCODONE-acetaminophen (PERCOCET) 7.5-325 MG tablet; Take 1 tablet by mouth 3 (three) times daily.     ----------------------------------------------------------------------------------------------------------------------  Problem List Items Addressed  This Visit    None    Visit Diagnoses    Facet arthritis of lumbar region [facility])       Relevant Medications   triamcinolone acetonide (KENALOG-40) injection 40 mg (Completed)   sodium chloride flush (NS) 0.9 % injection 10 mL   ropivacaine (PF) 2 mg/mL (0.2%) (NAROPIN) injection 10 mL (Completed)   midazolam (VERSED) 5 MG/5ML injection 5 mg (Completed)   lidocaine (PF) (XYLOCAINE) 1 % injection 5 mL (Completed)   lactated ringers infusion 1,000 mL   fentaNYL (SUBLIMAZE) injection 100 mcg   oxyCODONE-acetaminophen (PERCOCET) 7.5-325 MG tablet      ----------------------------------------------------------------------------------------------------------------------  1. Sciatica of left side associated with disorder of lumbar spine I have reiterated the need for new back stretching strengthening exercises and core strengthening with aerobic conditioning as discussed  2. Left side lumbar facet arthropathy. We will proceed with a left lumbar diagnostic facet block today. The risks and benefits of an reviewed in detail. 3. Chronic left-sided low back pain with left-sided sciatica I will refill his medications with return to clinic in 1 month for repeat injection. We have also reviewed the [facility] practitioner database and it is appropriate. 4. History of alcohol abuse ----------------------------------------------------------------------------------------------------------------------  I have discontinued Mr. Gurevich's diphenoxylate-atropine. I am also having him maintain his divalproex, donepezil, colestipol, aspirin EC, tamsulosin, propranolol, finasteride, acetaminophen, simvastatin, metoprolol succinate, omeprazole, memantine, primidone, gabapentin, lisinopril-hydrochlorothiazide, buPROPion, sitaGLIPtin, glimepiride, metFORMIN, fenofibrate, albuterol, potassium chloride SA, QUEtiapine, and oxyCODONE-acetaminophen. We administered triamcinolone acetonide, ropivacaine (PF) 2 mg/mL  (0.2%), midazolam, lidocaine (PF), and lactated ringers.   Meds ordered this encounter  Medications  . triamcinolone acetonide (KENALOG-40) injection 40 mg  . sodium chloride flush (NS) 0.9 % injection 10 mL  . ropivacaine (PF) 2 mg/mL (0.2%) (NAROPIN) injection 10 mL  . midazolam (VERSED) 5 MG/5ML injection 5 mg  . lidocaine (PF) (XYLOCAINE) 1 % injection 5 mL  . lactated ringers infusion 1,000 mL  . fentaNYL (SUBLIMAZE) injection 100 mcg  . oxyCODONE-acetaminophen (PERCOCET) 7.5-325 MG tablet    Sig: Take 1 tablet by mouth 3 (three) times daily.    Dispense:  90 tablet    Refill:  0    Dont fill until [id number]     Procedure: Left lumbar facet at L3-S1 medial branch block under fluoroscopic guidance with moderate sedation   Patient was taken to the fluoroscopy suite and placed in prone position. A total dose of 2 mg of Versed with 0 cc of fentanyl were titrated for moderate sedation. Vital signs were stable throughout the procedure. The  overlying area of skin on the  left side was prepped with Betadine 3 and strict aseptic technique was utilized throughout the procedure. Flouroscopy was used to I identify the areas overlying the aforementioned  facets at L3-4  L4-5 and  L5-S1. 1% lidocaine 1 cc was infiltrated subcutaneously and into the fascia with a 25-gauge needle at each of these sites. I then advanced a 22-gauge 3-1/2 inch Quinckie needle with the needle tip to lie at the "[facility]" portion of the [employer] dog". There was negative aspiration for heme or CSF and  no paresthesia. I then injected 2 cc of ropivacaine 0.2% mixed with10 mg of triamcinolone at each of the aforementioned sites. These needles were withdrawn and the L5-S1 needle was redirected towards the S1 posterior foramen. This was done without  paresthesia, there was negative aspiration and 2 cc of this same mixture was injected at this site. The patient was convalesced discharged home stable condition for follow-up as  mentioned.   Follow-up: Return for evaluation, procedure.    Molli Barrows, MD @DATE @  The  practitioner database for opioid medications on this patient has been reviewed by me and my staff   Greater than 50% of the total encounter time was spent in counseling and / or coordination of care.     This dictation was performed utilizing Systems analyst.  Please excuse any unintentional or mistaken typographical errors as a result.

## 2016-09-05 ENCOUNTER — Telehealth: Payer: Self-pay | Admitting: *Deleted

## 2016-09-05 NOTE — Telephone Encounter (Signed)
Es any post procedure concerns.

## 2016-09-10 ENCOUNTER — Encounter: Payer: Self-pay | Admitting: Family Medicine

## 2016-09-10 ENCOUNTER — Other Ambulatory Visit: Payer: Self-pay | Admitting: Ophthalmology

## 2016-09-10 DIAGNOSIS — H532 Diplopia: Secondary | ICD-10-CM

## 2016-09-10 LAB — HM DIABETES EYE EXAM

## 2016-09-13 ENCOUNTER — Encounter: Payer: Self-pay | Admitting: *Deleted

## 2016-09-13 ENCOUNTER — Ambulatory Visit: Payer: PPO | Admitting: Family Medicine

## 2016-09-14 ENCOUNTER — Ambulatory Visit: Admit: 2016-09-14 | Payer: PPO | Admitting: Gastroenterology

## 2016-09-14 ENCOUNTER — Ambulatory Visit
Admission: RE | Admit: 2016-09-14 | Discharge: 2016-09-14 | Disposition: A | Discharge status: Home / Self Care | Payer: PPO | Source: Ambulatory Visit | Attending: Unknown Physician Specialty | Admitting: Unknown Physician Specialty

## 2016-09-14 ENCOUNTER — Encounter: Payer: Self-pay | Admitting: *Deleted

## 2016-09-14 ENCOUNTER — Ambulatory Visit: Payer: PPO | Admitting: Anesthesiology

## 2016-09-14 ENCOUNTER — Encounter
Admission: RE | Disposition: A | Discharge status: Home / Self Care | Payer: Self-pay | Source: Ambulatory Visit | Attending: Unknown Physician Specialty

## 2016-09-14 DIAGNOSIS — K219 Gastro-esophageal reflux disease without esophagitis: Secondary | ICD-10-CM | POA: Insufficient documentation

## 2016-09-14 DIAGNOSIS — Z87891 Personal history of nicotine dependence: Secondary | ICD-10-CM | POA: Diagnosis not present

## 2016-09-14 DIAGNOSIS — Z79899 Other long term (current) drug therapy: Secondary | ICD-10-CM | POA: Insufficient documentation

## 2016-09-14 DIAGNOSIS — F419 Anxiety disorder, unspecified: Secondary | ICD-10-CM | POA: Diagnosis not present

## 2016-09-14 DIAGNOSIS — K52832 Lymphocytic colitis: Secondary | ICD-10-CM | POA: Diagnosis not present

## 2016-09-14 DIAGNOSIS — E119 Type 2 diabetes mellitus without complications: Secondary | ICD-10-CM | POA: Insufficient documentation

## 2016-09-14 DIAGNOSIS — K640 First degree hemorrhoids: Secondary | ICD-10-CM | POA: Insufficient documentation

## 2016-09-14 DIAGNOSIS — K644 Residual hemorrhoidal skin tags: Secondary | ICD-10-CM | POA: Insufficient documentation

## 2016-09-14 DIAGNOSIS — M1990 Unspecified osteoarthritis, unspecified site: Secondary | ICD-10-CM | POA: Insufficient documentation

## 2016-09-14 DIAGNOSIS — F0390 Unspecified dementia without behavioral disturbance: Secondary | ICD-10-CM | POA: Insufficient documentation

## 2016-09-14 DIAGNOSIS — N29 Other disorders of kidney and ureter in diseases classified elsewhere: Secondary | ICD-10-CM | POA: Diagnosis not present

## 2016-09-14 DIAGNOSIS — G250 Essential tremor: Secondary | ICD-10-CM | POA: Diagnosis not present

## 2016-09-14 DIAGNOSIS — I10 Essential (primary) hypertension: Secondary | ICD-10-CM | POA: Insufficient documentation

## 2016-09-14 DIAGNOSIS — E0590 Thyrotoxicosis, unspecified without thyrotoxic crisis or storm: Secondary | ICD-10-CM | POA: Insufficient documentation

## 2016-09-14 DIAGNOSIS — R197 Diarrhea, unspecified: Secondary | ICD-10-CM | POA: Diagnosis not present

## 2016-09-14 DIAGNOSIS — K648 Other hemorrhoids: Secondary | ICD-10-CM | POA: Diagnosis not present

## 2016-09-14 DIAGNOSIS — K449 Diaphragmatic hernia without obstruction or gangrene: Secondary | ICD-10-CM | POA: Insufficient documentation

## 2016-09-14 DIAGNOSIS — Z955 Presence of coronary angioplasty implant and graft: Secondary | ICD-10-CM | POA: Insufficient documentation

## 2016-09-14 DIAGNOSIS — Z8719 Personal history of other diseases of the digestive system: Secondary | ICD-10-CM | POA: Diagnosis not present

## 2016-09-14 DIAGNOSIS — K529 Noninfective gastroenteritis and colitis, unspecified: Secondary | ICD-10-CM | POA: Diagnosis not present

## 2016-09-14 DIAGNOSIS — E7800 Pure hypercholesterolemia, unspecified: Secondary | ICD-10-CM | POA: Insufficient documentation

## 2016-09-14 DIAGNOSIS — K5150 Left sided colitis without complications: Secondary | ICD-10-CM | POA: Diagnosis not present

## 2016-09-14 DIAGNOSIS — Z7982 Long term (current) use of aspirin: Secondary | ICD-10-CM | POA: Insufficient documentation

## 2016-09-14 DIAGNOSIS — Z86010 Personal history of colonic polyps: Secondary | ICD-10-CM | POA: Diagnosis not present

## 2016-09-14 DIAGNOSIS — Z7984 Long term (current) use of oral hypoglycemic drugs: Secondary | ICD-10-CM | POA: Insufficient documentation

## 2016-09-14 DIAGNOSIS — K5730 Diverticulosis of large intestine without perforation or abscess without bleeding: Secondary | ICD-10-CM | POA: Insufficient documentation

## 2016-09-14 DIAGNOSIS — F329 Major depressive disorder, single episode, unspecified: Secondary | ICD-10-CM | POA: Insufficient documentation

## 2016-09-14 HISTORY — PX: COLONOSCOPY WITH PROPOFOL: SHX5780

## 2016-09-14 LAB — GLUCOSE, CAPILLARY: Glucose-Capillary: 132 mg/dL — ABNORMAL HIGH (ref 65–99)

## 2016-09-14 SURGERY — COLONOSCOPY WITH PROPOFOL
Anesthesia: General

## 2016-09-14 MED ORDER — PROPOFOL 500 MG/50ML IV EMUL
INTRAVENOUS | Status: AC
  Filled 2016-09-14: qty 50

## 2016-09-14 MED ORDER — SODIUM CHLORIDE 0.9 % IV SOLN
INTRAVENOUS | Status: DC
  Administered 2016-09-14: 10:00:00 via INTRAVENOUS

## 2016-09-14 MED ORDER — PROPOFOL 500 MG/50ML IV EMUL
INTRAVENOUS | Status: DC | PRN
  Administered 2016-09-14: 120 ug/kg/min via INTRAVENOUS

## 2016-09-14 MED ORDER — SODIUM CHLORIDE 0.9 % IV SOLN
INTRAVENOUS | Status: DC
Start: 2016-09-14 — End: 2016-09-14

## 2016-09-14 MED ORDER — PROPOFOL 10 MG/ML IV BOLUS
INTRAVENOUS | Status: DC | PRN
  Administered 2016-09-14: 90 mg via INTRAVENOUS

## 2016-09-14 NOTE — Anesthesia Post-op Follow-up Note (Cosign Needed)
Anesthesia QCDR form completed.        

## 2016-09-14 NOTE — Anesthesia Preprocedure Evaluation (Signed)
Anesthesia Evaluation  Patient identified by MRN, date of birth, ID band Patient awake    Reviewed: Allergy & Precautions, NPO status , Patient's Chart, lab work & pertinent test results, reviewed documented beta blocker date and time   History of Anesthesia Complications Negative for: history of anesthetic complications  Airway Mallampati: III  TM Distance: >3 FB     Dental  (+) Missing   Pulmonary neg shortness of breath, neg sleep apnea, neg COPD, neg recent URI, former smoker,           Cardiovascular Exercise Tolerance: Good hypertension, Pt. on medications and Pt. on home beta blockers (-) angina(-) CAD, (-) Past MI, (-) Cardiac Stents and (-) CABG (-) dysrhythmias (-) Valvular Problems/Murmurs     Neuro/Psych PSYCHIATRIC DISORDERS (Dementia, depression) Lumbar herniated disc +Dementia negative neurological ROS     GI/Hepatic Neg liver ROS, hiatal hernia, PUD, GERD  ,  Endo/Other  diabetes, Type 2, Oral Hypoglycemic Agents  Renal/GU Renal InsufficiencyRenal disease  negative genitourinary   Musculoskeletal  (+) Arthritis ,   Abdominal   Peds  Hematology  (+) anemia ,   Anesthesia Other Findings Past Medical History: No date: Anxiety No date: Anxiety and depression No date: Arthritis No date: Colon polyps No date: Dementia No date: Depression No date: Diabetes mellitus type 2 in nonobese (HCC) No date: GERD (gastroesophageal reflux disease) No date: Headache No date: 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[place]) No date: History of hiatal hernia No date: Hypercholesteremia No date: Hypertension No date: Hyperthyroidism No date: Neuropathic pain No date: Primary localized osteoarthritis of right knee No date: Stomach ulcer No date: Tremor, essential   Reproductive/Obstetrics negative OB ROS                             Lab Results  Component Value Date   WBC 6.4 08/10/2016   HGB 10.8 (L)  08/10/2016   HCT 31.2 (L) 08/10/2016   MCV 92.2 08/10/2016   PLT 219 08/10/2016   Lab Results  Component Value Date   CREATININE 1.24 08/27/2016   BUN 26 (H) 08/27/2016   NA 135 08/27/2016   K 4.6 08/27/2016   CL 96 08/27/2016   CO2 29 08/27/2016    Anesthesia Physical  Anesthesia Plan  ASA: III  Anesthesia Plan: General   Post-op Pain Management:    Induction: Intravenous  Airway Management Planned:   Additional Equipment:   Intra-op Plan:   Post-operative Plan:   Informed Consent: I have reviewed the patients History and Physical, chart, labs and discussed the procedure including the risks, benefits and alternatives for the proposed anesthesia with the patient or authorized representative who has indicated his/her understanding and acceptance.   Dental advisory given  Plan Discussed with: CRNA  Anesthesia Plan Comments:         Anesthesia Quick Evaluation

## 2016-09-14 NOTE — H&P (Signed)
Primary Care Physician:  Tommi Rumps, MD Primary Gastroenterologist:  Dr. Vira Agar  Pre-Procedure History & Physical: HPI:  James Hood is a 76 y.o. male is here for an colonoscopy.   Past Medical History:  Diagnosis Date  . Anxiety   . Anxiety and depression   . Arthritis   . Colon polyps   . Dementia   . Depression   . Diabetes mellitus type 2 in nonobese (HCC)   . GERD (gastroesophageal reflux disease)   . Headache   . 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 History of hiatal hernia   . Hypercholesteremia   . Hypertension   . Hyperthyroidism   . Neuropathic pain   . Primary localized osteoarthritis of right knee   . Stomach ulcer   . Tremor, essential     Past Surgical History:  Procedure Laterality Date  . esophageal stretch    . JOINT REPLACEMENT    . LUMBAR LAMINECTOMY/DECOMPRESSION MICRODISCECTOMY Left 02/03/2016   Procedure: LEFT L5-S1 DISKECTOMY;  Surgeon: Leeroy Cha, MD;  Location: [place] [facility];  Service: Neurosurgery;  Laterality: Left;  LEFT L5-S1 DISKECTOMY  . TONSILLECTOMY    . TOTAL KNEE ARTHROPLASTY Right 11/15/2014   Procedure: TOTAL KNEE ARTHROPLASTY;  Surgeon: Elsie Saas, MD;  Location: [place];  Service: Orthopedics;  Laterality: Right;    Prior to Admission medications   Medication Sig Start Date End Date Taking? Authorizing Provider  albuterol (PROVENTIL HFA;VENTOLIN HFA) 108 (90 Base) MCG/ACT inhaler Inhale 2 puffs into the lungs every 6 (six) hours as needed for wheezing or shortness of breath. 08/12/16  Yes Henreitta Leber, MD  aspirin EC 81 MG tablet Take 81 mg by mouth every morning.    Yes Historical Provider, MD  buPROPion (ZYBAN) 150 MG 12 hr tablet Take 1 tablet (150 mg total) by mouth 2 (two) times daily. 06/13/16  Yes Leone Haven, MD  colestipol (COLESTID) 1 g tablet Take 2 g by mouth daily.  09/16/15  Yes Historical Provider, MD  divalproex (DEPAKOTE ER) 250 MG 24 hr tablet Take 250 mg by mouth 2 (two) times daily. Pt takes  250mg  in the morning, and 250mg  in the afternoon   Yes Historical Provider, MD  donepezil (ARICEPT) 10 MG tablet Take 10 mg by mouth at bedtime.   Yes Historical Provider, MD  fenofibrate (TRICOR) 145 MG tablet TAKE ONE TABLET BY MOUTH DAILY 08/09/16  Yes Leone Haven, MD  finasteride (PROSCAR) 5 MG tablet Take 1 tablet (5 mg total) by mouth daily. 10/07/15  Yes Nickie Retort, MD  gabapentin (NEURONTIN) 600 MG tablet Take 2 tablets (1,200 mg total) by mouth 3 (three) times daily. 05/16/16  Yes Leone Haven, MD  glimepiride (AMARYL) 4 MG tablet Take 1 tablet (4 mg total) by mouth 2 (two) times daily. 07/16/16  Yes Leone Haven, MD  lisinopril-hydrochlorothiazide (PRINZIDE,ZESTORETIC) 20-25 MG tablet Take 1 tablet by mouth 2 (two) times daily. 06/13/16  Yes Leone Haven, MD  memantine (NAMENDA) 10 MG tablet Take 10 mg by mouth 2 (two) times daily.  05/02/16  Yes Historical Provider, MD  metFORMIN (GLUCOPHAGE) 500 MG tablet TAKE TWO TABLETS BY MOUTH TWICE A DAY WITH A MEAL 08/06/16  Yes Leone Haven, MD  metoprolol succinate (TOPROL-XL) 25 MG 24 hr tablet Take 25 mg by mouth daily.  04/23/16  Yes Historical Provider, MD  omeprazole (PRILOSEC) 20 MG capsule 20 mg 2 (two) times daily before a meal.  04/01/16  Yes Historical Provider, MD  oxyCODONE-acetaminophen (PERCOCET) 7.5-325 MG tablet Take 1 tablet by mouth 3 (three) times daily. 09/04/16  Yes Molli Barrows, MD  potassium chloride SA (K-DUR,KLOR-CON) 20 MEQ tablet Take 2 tablets (40 mEq total) by mouth daily. 08/16/16  Yes Leone Haven, MD  primidone (MYSOLINE) 50 MG tablet TAKE 5 TABLETS IN THE MORNING AND 3 TABLETS IN THE EVENING 02/06/16  Yes Historical Provider, MD  propranolol (INDERAL) 60 MG tablet Take 60 mg by mouth 2 (two) times daily.    Yes Historical Provider, MD  QUEtiapine (SEROQUEL) 25 MG tablet TAKE THREE TO FOUR TABLETS BY MOUTH AT NIGHT 08/13/16  Yes Historical Provider, MD  simvastatin (ZOCOR) 40 MG tablet  Take 1 tablet (40 mg total) by mouth daily. 04/09/16  Yes Leone Haven, MD  sitaGLIPtin (JANUVIA) 100 MG tablet Take 1 tablet (100 mg total) by mouth daily. 06/15/16  Yes Leone Haven, MD  tamsulosin (FLOMAX) 0.4 MG CAPS capsule Take 0.4 mg by mouth daily after supper.  09/15/15  Yes Historical Provider, MD  acetaminophen (TYLENOL) 500 MG tablet Take 1,000 mg by mouth as needed.     Historical Provider, MD    Allergies as of 09/07/2016 - Review Complete 09/04/2016  Allergen Reaction Noted  . No known allergies  02/02/2016    Family History  Problem Relation Age of Onset  . Heart attack Mother   . Heart disease Mother   . Hypertension Father   . Depression Father   . Kidney cancer Neg Hx   . Prostate cancer Neg Hx     Social History   Social History  . Marital status: Married    Spouse name: N/A  . Number of children: N/A  . Years of education: N/A   Occupational History  . Not on file.   Social History Main Topics  . Smoking status: Former Smoker    Packs/day: 1.00    Years: 60.00    Types: Cigarettes    Quit date: 08/2016  . Smokeless tobacco: Never Used  . 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 Drug use: No  . Sexual activity: Not Currently   Other Topics Concern  . Not on file   Social History Narrative  . No narrative on file    Review of Systems: See HPI, otherwise negative ROS  Physical Exam: BP 116/79   Pulse (!) 109   Temp (!) 96.4 F (35.8 C) (Tympanic)   Resp 20   SpO2 96%  General:   Alert,  pleasant and cooperative in NAD Head:  Normocephalic and atraumatic. Neck:  Supple; no masses or thyromegaly. Lungs:  Clear throughout to auscultation.    Heart:  Regular rate and rhythm. Abdomen:  Soft, nontender and nondistended. Normal bowel sounds, without guarding, and without rebound.   Neurologic:  Alert and  oriented x4;  grossly normal neurologically.  Impression/Plan: James Hood is here for an  colonoscopy to be performed for diarrhea  Risks, benefits, limitations, and alternatives regarding  colonoscopy have been reviewed with the patient.  Questions have been answered.  All parties agreeable.   Gaylyn Cheers, MD  09/14/2016, 10:13 AM

## 2016-09-14 NOTE — Transfer of Care (Signed)
Immediate Anesthesia Transfer of Care Note  Patient: James Hood  Procedure(s) Performed: Procedure(s): COLONOSCOPY WITH PROPOFOL (N/A)  Patient Location: PACU and Endoscopy Unit  Anesthesia Type:General  Level of Consciousness: drowsy and patient cooperative  Airway & Oxygen Therapy: Patient Spontanous Breathing and Patient connected to face mask oxygen  Post-op Assessment: Report given to RN and Post -op Vital signs reviewed and stable  Post vital signs: Reviewed and stable  Last Vitals:  Vitals:   09/14/16 0945 09/14/16 1041  BP: 116/79 93/62  Pulse: (!) 109 (!) 102  Resp: 20 19  Temp: (!) 35.8 C 37 C    Last Pain:  Vitals:   09/14/16 1041  TempSrc: Tympanic  PainSc:          Complications: No apparent anesthesia complications

## 2016-09-14 NOTE — Op Note (Signed)
[facility] Gastroenterology Patient Name: James Hood Procedure Date: 09/14/2016 10:17 AM MRN: [id number] Account #: [id number] Date of Birth: 1940-05-30 Admit Type: Outpatient Age: 76 Room: [facility] ENDO ROOM 1 Gender: Male Note Status: Finalized Procedure:            Colonoscopy Indications:          Clinically significant diarrhea of unexplained origin Providers:            Manya Silvas, MD Referring MD:         Angela Adam. Caryl Bis (Referring MD) Medicines:            Propofol per Anesthesia Complications:        No immediate complications. Procedure:            Pre-Anesthesia Assessment:                       - After reviewing the risks and benefits, the patient                        was deemed in satisfactory condition to undergo the                        procedure.                       After obtaining informed consent, the colonoscope was                        passed under direct vision. Throughout the procedure,                        the patient's blood pressure, pulse, and oxygen                        saturations were monitored continuously. The                        Colonoscope was introduced through the anus and                        advanced to the the cecum, identified by appendiceal                        orifice and ileocecal valve. The colonoscopy was                        performed without difficulty. The patient tolerated the                        procedure well. The quality of the bowel preparation                        was good. Findings:      A patchy area of mildly erythematous mucosa was found in the sigmoid       colon. This was biopsied with a cold forceps for histology. Biopsies       done of ascending colon, transverse colon, descending colon also and       placed in separate jars.      Many small-mouthed diverticula were found in the sigmoid colon and  descending colon.      External and internal hemorrhoids were  found during endoscopy. The       hemorrhoids were small and Grade I (internal hemorrhoids that do not       prolapse). Impression:           - Erythematous mucosa in the sigmoid colon. Biopsied.                       - Diverticulosis in the sigmoid colon and in the                        descending colon.                       - External and internal hemorrhoids. Recommendation:       - Await pathology results. Manya Silvas, MD 09/14/2016 10:42:38 AM This report has been signed electronically. Number of Addenda: 0 Note Initiated On: 09/14/2016 10:17 AM Scope Withdrawal Time: 0 hours 10 minutes 14 seconds  Total Procedure Duration: 0 hours 13 minutes 47 seconds       [facility]

## 2016-09-14 NOTE — Anesthesia Postprocedure Evaluation (Signed)
Anesthesia Post Note  Patient: James Hood  Procedure(s) Performed: Procedure(s) (LRB): COLONOSCOPY WITH PROPOFOL (N/A)  Patient location during evaluation: Endoscopy Anesthesia Type: General Level of consciousness: awake and alert Pain management: pain level controlled Vital Signs Assessment: post-procedure vital signs reviewed and stable Respiratory status: spontaneous breathing, nonlabored ventilation, respiratory function stable and patient connected to nasal cannula oxygen Cardiovascular status: blood pressure returned to baseline and stable Postop Assessment: no signs of nausea or vomiting Anesthetic complications: no     Last Vitals:  Vitals:   09/14/16 1101 09/14/16 1111  BP: 124/75 117/60  Pulse: 87 84  Resp: 18 20  Temp:      Last Pain:  Vitals:   09/14/16 1041  TempSrc: Tympanic  PainSc:                  Martha Clan

## 2016-09-17 ENCOUNTER — Encounter: Payer: Self-pay | Admitting: Unknown Physician Specialty

## 2016-09-17 ENCOUNTER — Telehealth: Payer: Self-pay

## 2016-09-17 NOTE — Telephone Encounter (Signed)
I called patient to schedule lumbar facet block #2. Dr. Andree Elk does not have a procedure opening until June 25 and the patient is in pain and cannot wait that long. Will you see if Dr. Andree Elk can open up a spot for him or let me schedule the procedure in an eval or new patient spot. Thanks

## 2016-09-18 LAB — SURGICAL PATHOLOGY

## 2016-09-18 NOTE — Telephone Encounter (Signed)
Spoke with patien'st wife and explained that patient should have a script to fill on 09-22-2016.States she will look for it and call back if she cannot find it, otherwise we will see patient on 10-18-2016 for his procedure. Pre procedure instructions given to patient's wife with her understanding.

## 2016-09-19 ENCOUNTER — Ambulatory Visit
Admission: RE | Admit: 2016-09-19 | Discharge: 2016-09-19 | Disposition: A | Discharge status: Home / Self Care | Payer: PPO | Source: Ambulatory Visit | Attending: Ophthalmology | Admitting: Ophthalmology

## 2016-09-19 DIAGNOSIS — J320 Chronic maxillary sinusitis: Secondary | ICD-10-CM | POA: Diagnosis not present

## 2016-09-19 DIAGNOSIS — H532 Diplopia: Secondary | ICD-10-CM | POA: Diagnosis not present

## 2016-09-19 DIAGNOSIS — R200 Anesthesia of skin: Secondary | ICD-10-CM | POA: Diagnosis not present

## 2016-09-19 DIAGNOSIS — I6782 Cerebral ischemia: Secondary | ICD-10-CM | POA: Diagnosis not present

## 2016-09-19 DIAGNOSIS — G9389 Other specified disorders of brain: Secondary | ICD-10-CM | POA: Diagnosis not present

## 2016-09-19 DIAGNOSIS — R413 Other amnesia: Secondary | ICD-10-CM | POA: Diagnosis not present

## 2016-09-19 DIAGNOSIS — G250 Essential tremor: Secondary | ICD-10-CM | POA: Diagnosis not present

## 2016-09-19 DIAGNOSIS — R251 Tremor, unspecified: Secondary | ICD-10-CM | POA: Diagnosis not present

## 2016-09-19 DIAGNOSIS — S0990XA Unspecified injury of head, initial encounter: Secondary | ICD-10-CM | POA: Diagnosis not present

## 2016-09-19 DIAGNOSIS — R262 Difficulty in walking, not elsewhere classified: Secondary | ICD-10-CM | POA: Diagnosis not present

## 2016-09-19 MED ORDER — GADOBENATE DIMEGLUMINE 529 MG/ML IV SOLN
15.0000 mL | Freq: Once | INTRAVENOUS | Status: AC | PRN
  Administered 2016-09-19: 15 mL via INTRAVENOUS

## 2016-10-03 ENCOUNTER — Other Ambulatory Visit: Payer: Self-pay

## 2016-10-05 ENCOUNTER — Other Ambulatory Visit: Payer: Self-pay | Admitting: Family Medicine

## 2016-10-12 ENCOUNTER — Other Ambulatory Visit: Payer: Self-pay | Admitting: Family Medicine

## 2016-10-16 ENCOUNTER — Telehealth: Payer: Self-pay | Admitting: Family Medicine

## 2016-10-16 NOTE — Telephone Encounter (Signed)
Please advise 

## 2016-10-16 NOTE — Telephone Encounter (Signed)
Pt  Needs a refill on Januvia faxed to [email or web address] order # [id number].Marland Kitchen Fax # [phone] Pt is wanting to get a 37m supply with 3 refills please advise  Placed letter pt wrote about this in Dr. Caryl Bis folder

## 2016-10-17 NOTE — Telephone Encounter (Signed)
Dr Caryl Bis.  Mr James Hood has [employer] and should be a [facility] patient.  I am going to be out of the office for 2 weeks but I will put in a referral for [facility] and one of the [facility] pharmacists will followup with him to see if he is eligible for low income subsidy/extra help or manufacturer patient assistance for Januvia.  He cannot have a copay card as he has medicare.    Bennye Alm, PharmD, [place] PGY2 Pharmacy Resident [phone]

## 2016-10-17 NOTE — Telephone Encounter (Signed)
Unfortunately we do not fax prescriptions to [place]. I can provide him with a written prescription and he can shop around with the prescription. We can also get our pharmacist involved to see if he knows of any assistance programs or coupons for Januvia this evening at the price down. I will for this message to him as well.

## 2016-10-17 NOTE — Telephone Encounter (Signed)
Noted, thanks!

## 2016-10-17 NOTE — Telephone Encounter (Signed)
Thank you Merleen Nicely. I will have Tanya let the patient know regarding this.

## 2016-10-17 NOTE — Telephone Encounter (Signed)
Pt returned phone call to office. Note from Dr. Caryl Bis was read to pt. Pt would like a call from Dr. Caryl Bis or his CMA to discuss further.

## 2016-10-17 NOTE — Telephone Encounter (Signed)
Spoke with patient I reviewed your statement with him again and he will take a written prescription if you can write it for a 90 day supply with refills so that she can find a pharmacy to fill it cheaper for him, possibly with a copay card.  He is in a donut hole and can't afford the cost at the normal pharmacy.  Thanks, please call when ready. thanks

## 2016-10-17 NOTE — Telephone Encounter (Signed)
Tried calling, no vm 

## 2016-10-18 ENCOUNTER — Other Ambulatory Visit: Payer: Self-pay | Admitting: Anesthesiology

## 2016-10-18 ENCOUNTER — Ambulatory Visit
Admission: RE | Admit: 2016-10-18 | Discharge: 2016-10-18 | Disposition: A | Discharge status: Home / Self Care | Payer: PPO | Source: Ambulatory Visit | Attending: Anesthesiology | Admitting: Anesthesiology

## 2016-10-18 ENCOUNTER — Ambulatory Visit (HOSPITAL_BASED_OUTPATIENT_CLINIC_OR_DEPARTMENT_OTHER): Payer: PPO | Admitting: Anesthesiology

## 2016-10-18 ENCOUNTER — Encounter: Payer: Self-pay | Admitting: Anesthesiology

## 2016-10-18 VITALS — BP 102/54 | HR 77 | Temp 97.9°F | Resp 20 | Ht 71.0 in | Wt 185.0 lb

## 2016-10-18 DIAGNOSIS — H532 Diplopia: Secondary | ICD-10-CM | POA: Diagnosis not present

## 2016-10-18 DIAGNOSIS — R52 Pain, unspecified: Secondary | ICD-10-CM

## 2016-10-18 DIAGNOSIS — G8929 Other chronic pain: Secondary | ICD-10-CM | POA: Insufficient documentation

## 2016-10-18 DIAGNOSIS — M5386 Other specified dorsopathies, lumbar region: Secondary | ICD-10-CM

## 2016-10-18 DIAGNOSIS — M4696 Unspecified inflammatory spondylopathy, lumbar region: Secondary | ICD-10-CM

## 2016-10-18 DIAGNOSIS — M47816 Spondylosis without myelopathy or radiculopathy, lumbar region: Secondary | ICD-10-CM

## 2016-10-18 DIAGNOSIS — M5442 Lumbago with sciatica, left side: Secondary | ICD-10-CM | POA: Diagnosis not present

## 2016-10-18 DIAGNOSIS — M539 Dorsopathy, unspecified: Secondary | ICD-10-CM

## 2016-10-18 MED ORDER — OXYCODONE-ACETAMINOPHEN 7.5-325 MG PO TABS: 1.0000 | ORAL_TABLET | Freq: Three times a day (TID) | ORAL | 0 refills | Status: DC

## 2016-10-18 MED ORDER — SODIUM CHLORIDE 0.9% FLUSH: 10.0000 mL | Freq: Once | INTRAVENOUS | Status: DC

## 2016-10-18 MED ORDER — TRIAMCINOLONE ACETONIDE 40 MG/ML IJ SUSP
40.0000 mg | Freq: Once | INTRAMUSCULAR | Status: AC
  Administered 2016-10-18: 40 mg

## 2016-10-18 MED ORDER — ROPIVACAINE HCL 2 MG/ML IJ SOLN
10.0000 mL | Freq: Once | INTRAMUSCULAR | Status: AC
  Administered 2016-10-18: 10 mL via EPIDURAL

## 2016-10-18 MED ORDER — LIDOCAINE HCL (PF) 1 % IJ SOLN: 5.0000 mL | Freq: Once | INTRAMUSCULAR | Status: DC

## 2016-10-18 MED ORDER — TRIAMCINOLONE ACETONIDE 40 MG/ML IJ SUSP
INTRAMUSCULAR | Status: AC
  Filled 2016-10-18: qty 1

## 2016-10-18 MED ORDER — LACTATED RINGERS IV SOLN: 1000.0000 mL | INTRAVENOUS | Status: DC

## 2016-10-18 MED ORDER — ROPIVACAINE HCL 2 MG/ML IJ SOLN
INTRAMUSCULAR | Status: AC
  Filled 2016-10-18: qty 10

## 2016-10-18 NOTE — Patient Instructions (Signed)
Pain Management Discharge Instructions  General Discharge Instructions :  If you need to reach your doctor call: Monday-Friday 8:00 am - 4:00 pm at [phone] or toll free [phone].  After clinic hours [phone] to have operator reach doctor.  Bring all of your medication bottles to all your appointments in the pain clinic.  To cancel or reschedule your appointment with Pain Management please remember to call 24 hours in advance to avoid a fee.  Refer to the educational materials which you have been given on: General Risks, I had my Procedure. Discharge Instructions, Post Sedation.  Post Procedure Instructions:  The drugs you were given will stay in your system until tomorrow, so for the next 24 hours you should not drive, make any legal decisions or drink any alcoholic beverages.  You may eat anything you prefer, but it is better to start with liquids then soups and crackers, and gradually work up to solid foods.  Please notify your doctor immediately if you have any unusual bleeding, trouble breathing or pain that is not related to your normal pain.  Depending on the type of procedure that was done, some parts of your body may feel week and/or numb.  This usually clears up by tonight or the next day.  Walk with the use of an assistive device or accompanied by an adult for the 24 hours.  You may use ice on the affected area for the first 24 hours.  Put ice in a Ziploc bag and cover with a towel and place against area 15 minutes on 15 minutes off.  You may switch to heat after 24 hours.GENERAL RISKS AND COMPLICATIONS  What are the risk, side effects and possible complications? Generally speaking, most procedures are safe.  However, with any procedure there are risks, side effects, and the possibility of complications.  The risks and complications are dependent upon the sites that are lesioned, or the type of nerve block to be performed.  The closer the procedure is to the spine,  the more serious the risks are.  Great care is taken when placing the radio frequency needles, block needles or lesioning probes, but sometimes complications can occur. 1. Infection: Any time there is an injection through the skin, there is a risk of infection.  This is why sterile conditions are used for these blocks.  There are four possible types of infection. 1. Localized skin infection. 2. Central Nervous System Infection-This can be in the form of Meningitis, which can be deadly. 3. Epidural Infections-This can be in the form of an epidural abscess, which can cause pressure inside of the spine, causing compression of the spinal cord with subsequent paralysis. This would require an emergency surgery to decompress, and there are no guarantees that the patient would recover from the paralysis. 4. Discitis-This is an infection of the intervertebral discs.  It occurs in about 1% of discography procedures.  It is difficult to treat and it may lead to surgery.        2. Pain: the needles have to go through skin and soft tissues, will cause soreness.       3. Damage to internal structures:  The nerves to be lesioned may be near blood vessels or    other nerves which can be potentially damaged.       4. Bleeding: Bleeding is more common if the patient is taking blood thinners such as  aspirin, Coumadin, Ticiid, Plavix, etc., or if he/she have some genetic predisposition  such as   hemophilia. Bleeding into the spinal canal can cause compression of the spinal  cord with subsequent paralysis.  This would require an emergency surgery to  decompress and there are no guarantees that the patient would recover from the  paralysis.       5. Pneumothorax:  Puncturing of a lung is a possibility, every time a needle is introduced in  the area of the chest or upper back.  Pneumothorax refers to free air around the  collapsed lung(s), inside of the thoracic cavity (chest cavity).  Another two possible  complications  related to a similar event would include: Hemothorax and Chylothorax.   These are variations of the Pneumothorax, where instead of air around the collapsed  lung(s), you may have blood or chyle, respectively.       6. Spinal headaches: They may occur with any procedures in the area of the spine.       7. Persistent CSF (Cerebro-Spinal Fluid) leakage: This is a rare problem, but may occur  with prolonged intrathecal or epidural catheters either due to the formation of a fistulous  track or a dural tear.       8. Nerve damage: By working so close to the spinal cord, there is always a possibility of  nerve damage, which could be as serious as a permanent spinal cord injury with  paralysis.       9. Death:  Although rare, severe deadly allergic reactions known as "Anaphylactic  reaction" can occur to any of the medications used.      10. Worsening of the symptoms:  We can always make thing worse.  What are the chances of something like this happening? Chances of any of this occuring are extremely low.  By statistics, you have more of a chance of getting killed in a motor vehicle accident: while driving to the hospital than any of the above occurring .  Nevertheless, you should be aware that they are possibilities.  In general, it is similar to taking a shower.  Everybody knows that you can slip, hit your head and get killed.  Does that mean that you should not shower again?  Nevertheless always keep in mind that statistics do not mean anything if you happen to be on the wrong side of them.  Even if a procedure has a 1 (one) in a 1,000,000 (million) chance of going wrong, it you happen to be that one..Also, keep in mind that by statistics, you have more of a chance of having something go wrong when taking medications.  Who should not have this procedure? If you are on a blood thinning medication (e.g. Coumadin, Plavix, see list of "Blood Thinners"), or if you have an active infection going on, you should not  have the procedure.  If you are taking any blood thinners, please inform your physician.  How should I prepare for this procedure?  Do not eat or drink anything at least six hours prior to the procedure.  Bring a driver with you .  It cannot be a taxi.  Come accompanied by an adult that can drive you back, and that is strong enough to help you if your legs get weak or numb from the local anesthetic.  Take all of your medicines the morning of the procedure with just enough water to swallow them.  If you have diabetes, make sure that you are scheduled to have your procedure done first thing in the morning, whenever possible.  If you have diabetes,   take only half of your insulin dose and notify our nurse that you have done so as soon as you arrive at the clinic.  If you are diabetic, but only take blood sugar pills (oral hypoglycemic), then do not take them on the morning of your procedure.  You may take them after you have had the procedure.  Do not take aspirin or any aspirin-containing medications, at least eleven (11) days prior to the procedure.  They may prolong bleeding.  Wear loose fitting clothing that may be easy to take off and that you would not mind if it got stained with Betadine or blood.  Do not wear any jewelry or perfume  Remove any nail coloring.  It will interfere with some of our monitoring equipment.  NOTE: Remember that this is not meant to be interpreted as a complete list of all possible complications.  Unforeseen problems may occur.  BLOOD THINNERS The following drugs contain aspirin or other products, which can cause increased bleeding during surgery and should not be taken for 2 weeks prior to and 1 week after surgery.  If you should need take something for relief of minor pain, you may take acetaminophen which is found in Tylenol,m Datril, Anacin-3 and Panadol. It is not blood thinner. The products listed below are.  Do not take any of the products listed below  in addition to any listed on your instruction sheet.  A.P.C or A.P.C with Codeine Codeine Phosphate Capsules #3 Ibuprofen Ridaura  ABC compound Congesprin Imuran rimadil  Advil Cope Indocin Robaxisal  Alka-Seltzer Effervescent Pain Reliever and Antacid Coricidin or Coricidin-D  Indomethacin Rufen  Alka-Seltzer plus Cold Medicine Cosprin Ketoprofen S-A-C Tablets  Anacin Analgesic Tablets or Capsules Coumadin Korlgesic Salflex  Anacin Extra Strength Analgesic tablets or capsules CP-2 Tablets Lanoril Salicylate  Anaprox Cuprimine Capsules Levenox Salocol  Anexsia-D Dalteparin Magan Salsalate  Anodynos Darvon compound Magnesium Salicylate Sine-off  Ansaid Dasin Capsules Magsal Sodium Salicylate  Anturane Depen Capsules Marnal Soma  APF Arthritis pain formula Dewitt's Pills Measurin Stanback  Argesic Dia-Gesic Meclofenamic Sulfinpyrazone  Arthritis Bayer Timed Release Aspirin Diclofenac Meclomen Sulindac  Arthritis pain formula Anacin Dicumarol Medipren Supac  Analgesic (Safety coated) Arthralgen Diffunasal Mefanamic Suprofen  Arthritis Strength Bufferin Dihydrocodeine Mepro Compound Suprol  Arthropan liquid Dopirydamole Methcarbomol with Aspirin Synalgos  ASA tablets/Enseals Disalcid Micrainin Tagament  Ascriptin Doan's Midol Talwin  Ascriptin A/D Dolene Mobidin Tanderil  Ascriptin Extra Strength Dolobid Moblgesic Ticlid  Ascriptin with Codeine Doloprin or Doloprin with Codeine Momentum Tolectin  Asperbuf Duoprin Mono-gesic Trendar  Aspergum Duradyne Motrin or Motrin IB Triminicin  Aspirin plain, buffered or enteric coated Durasal Myochrisine Trigesic  Aspirin Suppositories Easprin Nalfon Trillsate  Aspirin with Codeine Ecotrin Regular or Extra Strength Naprosyn Uracel  Atromid-S Efficin Naproxen Ursinus  Auranofin Capsules Elmiron Neocylate Vanquish  Axotal Emagrin Norgesic Verin  Azathioprine Empirin or Empirin with Codeine Normiflo Vitamin E  Azolid Emprazil Nuprin Voltaren  Bayer  Aspirin plain, buffered or children's or timed BC Tablets or powders Encaprin Orgaran Warfarin Sodium  Buff-a-Comp Enoxaparin Orudis Zorpin  Buff-a-Comp with Codeine Equegesic Os-Cal-Gesic   Buffaprin Excedrin plain, buffered or Extra Strength Oxalid   Bufferin Arthritis Strength Feldene Oxphenbutazone   Bufferin plain or Extra Strength Feldene Capsules Oxycodone with Aspirin   Bufferin with Codeine Fenoprofen Fenoprofen Pabalate or Pabalate-SF   Buffets II Flogesic Panagesic   Buffinol plain or Extra Strength Florinal or Florinal with Codeine Panwarfarin   Buf-Tabs Flurbiprofen Penicillamine   Butalbital Compound Four-way cold tablets   Penicillin   Butazolidin Fragmin Pepto-Bismol   Carbenicillin Geminisyn Percodan   Carna Arthritis Reliever Geopen Persantine   Carprofen Gold's salt Persistin   Chloramphenicol Goody's Phenylbutazone   Chloromycetin Haltrain Piroxlcam   Clmetidine heparin Plaquenil   Cllnoril Hyco-pap Ponstel   Clofibrate Hydroxy chloroquine Propoxyphen         Before stopping any of these medications, be sure to consult the physician who ordered them.  Some, such as Coumadin (Warfarin) are ordered to prevent or treat serious conditions such as "deep thrombosis", "pumonary embolisms", and other heart problems.  The amount of time that you may need off of the medication may also vary with the medication and the reason for which you were taking it.  If you are taking any of these medications, please make sure you notify your pain physician before you undergo any procedures.         Facet Blocks Patient Information  Description: The facets are joints in the spine between the vertebrae.  Like any joints in the body, facets can become irritated and painful.  Arthritis can also effect the facets.  By injecting steroids and local anesthetic in and around these joints, we can temporarily block the nerve supply to them.  Steroids act directly on irritated nerves and tissues  to reduce selling and inflammation which often leads to decreased pain.  Facet blocks may be done anywhere along the spine from the neck to the low back depending upon the location of your pain.   After numbing the skin with local anesthetic (like Novocaine), a small needle is passed onto the facet joints under x-ray guidance.  You may experience a sensation of pressure while this is being done.  The entire block usually lasts about 15-25 minutes.   Conditions which may be treated by facet blocks:   Low back/buttock pain  Neck/shoulder pain  Certain types of headaches  Preparation for the injection:  1. Do not eat any solid food or dairy products within 8 hours of your appointment. 2. You may drink clear liquid up to 3 hours before appointment.  Clear liquids include water, black coffee, juice or soda.  No milk or cream please. 3. You may take your regular medication, including pain medications, with a sip of water before your appointment.  Diabetics should hold regular insulin (if taken separately) and take 1/2 normal NPH dose the morning of the procedure.  Carry some sugar containing items with you to your appointment. 4. A driver must accompany you and be prepared to drive you home after your procedure. 5. Bring all your current medications with you. 6. An IV may be inserted and sedation may be given at the discretion of the physician. 7. A blood pressure cuff, EKG and other monitors will often be applied during the procedure.  Some patients may need to have extra oxygen administered for a short period. 8. You will be asked to provide medical information, including your allergies and medications, prior to the procedure.  We must know immediately if you are taking blood thinners (like Coumadin/Warfarin) or if you are allergic to IV iodine contrast (dye).  We must know if you could possible be pregnant.  Possible side-effects:   Bleeding from needle site  Infection (rare, may require  surgery)  Nerve injury (rare)  Numbness & tingling (temporary)  Difficulty urinating (rare, temporary)  Spinal headache (a headache worse with upright posture)  Light-headedness (temporary)  Pain at injection site (serveral days)  Decreased blood pressure (rare,   temporary)  Weakness in arm/leg (temporary)  Pressure sensation in back/neck (temporary)   Call if you experience:   Fever/chills associated with headache or increased back/neck pain  Headache worsened by an upright position  New onset, weakness or numbness of an extremity below the injection site  Hives or difficulty breathing (go to the emergency room)  Inflammation or drainage at the injection site(s)  Severe back/neck pain greater than usual  New symptoms which are concerning to you  Please note:  Although the local anesthetic injected can often make your back or neck feel good for several hours after the injection, the pain will likely return. It takes 3-7 days for steroids to work.  You may not notice any pain relief for at least one week.  If effective, we will often do a series of 2-3 injections spaced 3-6 weeks apart to maximally decrease your pain.  After the initial series, you may be a candidate for a more permanent nerve block of the facets.  If you have any questions, please call [phone] [facility] 

## 2016-10-18 NOTE — Progress Notes (Signed)
Safety precautions to be maintained throughout the outpatient stay will include: orient to surroundings, keep bed in low position, maintain call bell within reach at all times, provide assistance with transfer out of bed and ambulation.  

## 2016-10-18 NOTE — Telephone Encounter (Signed)
Pt called back looking for an update. Please advise, thank you!  Call pt @ [phone]

## 2016-10-18 NOTE — Telephone Encounter (Signed)
Question: is he going to be able to get a Rx for him to take to a pharmacy of his choice or does he need to wait to meet with [facility]? If so what does he do in the meantime

## 2016-10-19 ENCOUNTER — Other Ambulatory Visit: Payer: Self-pay | Admitting: Pharmacist

## 2016-10-19 ENCOUNTER — Telehealth: Payer: Self-pay

## 2016-10-19 ENCOUNTER — Encounter: Payer: Self-pay | Admitting: Pharmacist

## 2016-10-19 MED ORDER — SITAGLIPTIN PHOSPHATE 100 MG PO TABS: 100.0000 mg | ORAL_TABLET | Freq: Every day | ORAL | 3 refills | Status: DC

## 2016-10-19 NOTE — Telephone Encounter (Signed)
Attempted to reach patient, not able to leave a message, Rx is up front for pick up. thanks

## 2016-10-19 NOTE — Telephone Encounter (Signed)
Prescription printed out. He can try to get this filled though he should discuss with [facility] as well.

## 2016-10-19 NOTE — Progress Notes (Signed)
Subjective:  Patient ID: James Hood, male    DOB: August 29, 1940  Age: 76 y.o. MRN: [id number]  CC: Hip Pain (left hip down to left ankle with the ankle being the worst.)   Service Provided on Last Visit: Procedure   Procedure: Left lumbar facet block to the medial branch under fluoroscopic as moderate sedation of 2 Previous Procedure: Left lumbar facet block via medial branch under fluoroscopic guidance with moderate sedation Previous Procedure:  None Previous PROCEDURE:Caudal epidural steroid No. 2 under fluoroscopic guidance with moderate sedation  HPI James Hood resents for reevaluation. He was last seen 1 month ago and has had good relief with his first lumbar facet block. He still having some periodic left calf pain but overall his left lower back pain has improved with his previous facet blocks. He desires to proceed with a repeat injection today. Otherwise the quality characteristic and distribution of his pain are stable in nature without change. He's tolerating his medications well however frequently has breakthrough during parts of the day. He is requesting an increase to 4 times a day dosing. Based on his narcotic assessment sheet he is tolerating medications well and they continue to help with his pain control. He denies any untoward side effects.  By history  He is a pleasant 76 year old white male with a long-standing history of low back pain for over one year. He reports that he had a laminectomy in October of this year with Dr. Joya Salm. He also states that he has continued to have lancinating left posterior and lateral leg pain described as sharp nagging pulsating and throbbing it's present throughout much of the day. He rates this as a VAS of 7 best a 2 and now a 5. He feels that it is gotten worse. He has some chronic intermittent weakness of the left leg. He had an MRI that was reviewed with him today that is in follow-up to his October surgery. He also reports having  had 3 epidural steroids prior to his surgery and 1 L5-S1 epidural after this. The pain is worse with bending climbing motion squatting and better with rest sleep. 5 mg of oxycodone does not give him much relief. We have reviewed his physician database and there are no concerns. He also takes Neurontin 1200 mg 3 times a day.  History [name] has a past medical history of Anxiety; Anxiety and depression; Arthritis; Colon polyps; Dementia; Depression; Diabetes mellitus type 2 in nonobese [facility]); GERD (gastroesophageal reflux disease); Headache; 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[place]); History of hiatal hernia; Hypercholesteremia; Hypertension; Hyperthyroidism; Neuropathic pain; Primary localized osteoarthritis of right knee; Stomach ulcer; and Tremor, essential.   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   His family history includes Depression in his father; Heart attack in his mother; Heart disease in his mother; Hypertension in his father.He reports that he quit smoking about 2 months ago. His smoking use included Cigarettes. He has a 60.00 pack-year smoking history. He has never used smokeless tobacco. He reports that he does not drink alcohol or use drugs.  No results found for this or any previous visit.  Tox[employer] Select 13  Date Value Ref Range Status  05/03/2016 FINAL  Final    Comment:    ==================================================================== TOXASSURE SELECT 13 (MW) ==================================================================== Test  Result       Flag       Units Drug Present and Declared for Prescription Verification   Oxycodone                      140          EXPECTED   ng/mg creat   Oxymorphone                    127          EXPECTED    ng/mg creat   Noroxycodone                   903          EXPECTED   ng/mg creat   Noroxymorphone                 71           EXPECTED   ng/mg creat    Sources of oxycodone are scheduled prescription medications.    Oxymorphone, noroxycodone, and noroxymorphone are expected    metabolites of oxycodone. Oxymorphone is also available as a    scheduled prescription medication.   Phenobarbital                  PRESENT      EXPECTED    Phenobarbital is an expected metabolite of primidone;    Phenobarbital may also be administered as a prescription drug. Drug Absent but Declared for Prescription Verification   Desmethyldiazepam              Not Detected UNEXPECTED ng/mg creat    Desmethyldiazepam is an expected metabolite of chlordiazepoxide,    clorazepate, halazepam, and prazepam.   Hydrocodone                    Not Detected UNEXPECTED ng/mg creat   Tramadol                       Not Detected UNEXPECTED ==================================================================== Test                      Result    Flag   Units      Ref Range   Creatinine              75               mg/dL      >=20 ==================================================================== Declared Medications:  The flagging and interpretation on this report are based on the  following declared medications.  Unexpected results may arise from  inaccuracies in the declared medications.  **Note: The testing scope of this panel includes these medications:  Clorazepate (Tranxene)  Hydrocodone (Norco)  Oxycodone  Oxycodone (Percocet)  Primidone (Mysoline)  Tramadol (Ultram)  **Note: The testing scope of this panel does not include following  reported medications:  Acetaminophen (Norco)  Acetaminophen (Percocet)  Acetaminophen (Tylenol)  Aspirin  Atropine (Lomotil)  Bupropion (Zyban)  Colestipol  Cyclobenzaprine (Flexeril)  Diphenoxylate (Lomotil)  Divalproex (Depakote)  Donepezil (Aricept)  Fenofibrate  (Tricor)  Finasteride (Proscar)  Gabapentin (Neurontin)  Glimepiride (Amaryl)  Hydrochlorothiazide (Prinzide)  Hydrochlorothiazide (Zestoretic)  Lisinopril (Prinzide)  Lisinopril (Zestoretic)  Memantine (Namenda)  Metformin (Glucophage)  Metoprolol (Lopressor)  Metoprolol (Toprol)  Omeprazole (Prilosec)  Prednisone (Sterapred)  Propranolol (Inderal)  Quetiapine (Seroquel)  Simvastatin (Zocor)  Tamsulosin (Flomax) ==================================================================== For clinical consultation, please call [phone]. ====================================================================  Outpatient Medications Prior to Visit  Medication Sig Dispense Refill  . acetaminophen (TYLENOL) 500 MG tablet Take 1,000 mg by mouth as needed.     Marland Kitchen albuterol (PROVENTIL HFA;VENTOLIN HFA) 108 (90 Base) MCG/ACT inhaler Inhale 2 puffs into the lungs every 6 (six) hours as needed for wheezing or shortness of breath. 1 Inhaler 2  . aspirin EC 81 MG tablet Take 81 mg by mouth every morning.     Marland Kitchen buPROPion (ZYBAN) 150 MG 12 hr tablet Take 1 tablet (150 mg total) by mouth 2 (two) times daily. 180 tablet 1  . colestipol (COLESTID) 1 g tablet Take 2 g by mouth daily.     . divalproex (DEPAKOTE ER) 250 MG 24 hr tablet Take 250 mg by mouth 2 (two) times daily. Pt takes 250mg  in the morning, and 250mg  in the afternoon    . donepezil (ARICEPT) 10 MG tablet Take 10 mg by mouth at bedtime.    . fenofibrate (TRICOR) 145 MG tablet TAKE ONE TABLET BY MOUTH DAILY 90 tablet 0  . finasteride (PROSCAR) 5 MG tablet Take 1 tablet (5 mg total) by mouth daily. 30 tablet 11  . gabapentin (NEURONTIN) 600 MG tablet Take 2 tablets (1,200 mg total) by mouth 3 (three) times daily. 180 tablet 3  . glimepiride (AMARYL) 4 MG tablet Take 1 tablet (4 mg total) by mouth 2 (two) times daily. 180 tablet 3  . JANUVIA 100 MG tablet TAKE ONE TABLET BY MOUTH DAILY 30 tablet 2  . lisinopril-hydrochlorothiazide  (PRINZIDE,ZESTORETIC) 20-25 MG tablet Take 1 tablet by mouth 2 (two) times daily. 180 tablet 2  . memantine (NAMENDA) 10 MG tablet Take 10 mg by mouth 2 (two) times daily.     . metFORMIN (GLUCOPHAGE) 500 MG tablet TAKE TWO TABLETS BY MOUTH TWICE A DAY WITH A MEAL 180 tablet 2  . metoprolol succinate (TOPROL-XL) 25 MG 24 hr tablet Take 25 mg by mouth daily.     Marland Kitchen omeprazole (PRILOSEC) 20 MG capsule 20 mg 2 (two) times daily before a meal.     . primidone (MYSOLINE) 50 MG tablet TAKE 5 TABLETS IN THE MORNING AND 3 TABLETS IN THE EVENING    . propranolol (INDERAL) 60 MG tablet Take 60 mg by mouth 2 (two) times daily.     . QUEtiapine (SEROQUEL) 25 MG tablet TAKE THREE TO FOUR TABLETS BY MOUTH AT NIGHT    . simvastatin (ZOCOR) 40 MG tablet TAKE 1 TABLET (40 MG TOTAL) BY MOUTH DAILY. 90 tablet 0  . tamsulosin (FLOMAX) 0.4 MG CAPS capsule Take 0.4 mg by mouth daily after supper.     Marland Kitchen oxyCODONE-acetaminophen (PERCOCET) 7.5-325 MG tablet Take 1 tablet by mouth 3 (three) times daily. 90 tablet 0  . potassium chloride SA (K-DUR,KLOR-CON) 20 MEQ tablet Take 2 tablets (40 mEq total) by mouth daily. (Patient not taking: Reported on 10/18/2016) 6 tablet 0   No facility-administered medications prior to visit.    Lab Results  Component Value Date   WBC 6.4 08/10/2016   HGB 10.8 (L) 08/10/2016   HCT 31.2 (L) 08/10/2016   PLT 219 08/10/2016   GLUCOSE 120 (H) 08/27/2016   CHOL 161 09/16/2015   TRIG 393.0 (H) 09/16/2015   HDL 38.50 (L) 09/16/2015   LDLDIRECT 66.0 09/16/2015   ALT 36 08/14/2016   AST 51 (H) 08/14/2016   NA 135 08/27/2016   K 4.6 08/27/2016   CL 96 08/27/2016   CREATININE 1.24 08/27/2016   BUN 26 (  H) 08/27/2016   CO2 29 08/27/2016   TSH 0.490 08/09/2016   PSA 0.07 (L) 09/16/2015   INR 1.08 11/05/2014   HGBA1C 7.7 (H) 06/13/2016    --------------------------------------------------------------------------------------------------------------------- Mr Lumbar Spine Wo  Contrast  Result Date: 03/29/2016 CLINICAL DATA:  Initial evaluation for chronic low back pain with left-sided sciatica, worsened. EXAM: MRI LUMBAR SPINE WITHOUT CONTRAST TECHNIQUE: Multiplanar, multisequence MR imaging of the lumbar spine was performed. No intravenous contrast was administered. COMPARISON:  Prior MRI from 09/22/2015. FINDINGS: Segmentation: L normal segmentation. Lowest well-formed disc is labeled the L5-S1 level. Same numbering system is employed as on previous exams. Alignment: Straightening with slight reversal of the normal lumbar lordosis, stable. No listhesis. Vertebrae: Vertebral body heights are maintained. No evidence for acute or chronic fracture. Reactive endplate changes about the left aspect of the L5-S1 interspace, similar to previous. Postoperative changes from recent decompressive left hemi laminectomy with micro discectomy seen at L5-S1. No concerning features identified. Conus medullaris: Extends to the L1 level and appears normal. Paraspinal and other soft tissues: Normal expected postoperative no concerning features identified. Paraspinous soft tissues otherwise within normal limits. Visualized visceral structures are normal. No retroperitoneal adenopathy. Changes related to decompressive laminectomy present within the posterior paraspinous soft tissues of the lower back. Disc levels: L1-2: Degenerative disc desiccation with intervertebral disc space narrowing and mild disc bulge. No stenosis. L2-3: Degenerative disc desiccation with intervertebral disc space narrowing and mild disc bulge. No stenosis. L3-4: Degenerative disc desiccation with mild diffuse disc bulge. Mild facet hypertrophy. Mild right foraminal stenosis related to disc bulge and facet disease, stable. No canal or left foraminal narrowing. L4-5: Degenerative disc bulge with intervertebral disc space narrowing and disc desiccation. Disc bulging a centric to the right without focal disc protrusion. Superimposed  bilateral facet arthrosis with ligamentum flavum hypertrophy. Reactive effusions within the bilateral L4-5 facets, stable. Minimal bilateral subarticular stenosis, slightly greater on the left. Mild bilateral foraminal narrowing related to disc bulge and facet disease, stable. L5-S1: Postoperative changes from interval decompressive left hemi laminectomy with micro discectomy. Previously seen left subarticular disc protrusion has been largely resected, although there appears to be a small residual central disc protrusion that minimally indents the ventral thecal sac (series 6, image 28). Soft tissue density within the left lateral epidural space likely reflects postoperative granulation tissue, although evaluation somewhat limited due to lack of IV contrast. This surrounds the transiting S1 nerve root in the left lateral recess (series 7, image 28). No significant canal stenosis. Superimposed bilateral facet arthrosis with reactive effusions in the bilateral L5-S1 facets is similar to previous. Mild left foraminal narrowing is relatively unchanged. No significant right foraminal stenosis. IMPRESSION: 1. Postoperative changes from recent decompressive left hemi laminectomy with microdiskectomy at L5-S1. Postoperative granulation tissue within the left epidural space, surrounding the descending left S1 nerve root. No residual stenosis or concerning features identified. 2. 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 No other significant stenosis or evidence for impingement. Electronically Signed   By: Jeannine Boga M.D.   On: 03/29/2016 13:10       ---------------------------------------------------------------------------------------------------------------------- Past Medical History:  Diagnosis Date  . Anxiety   . Anxiety and depression   . Arthritis   . Colon polyps   . Dementia   . Depression   . Diabetes mellitus type 2 in nonobese  (HCC)   . GERD (gastroesophageal reflux disease)   . Headache   . 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[place])   .  History of hiatal hernia   . Hypercholesteremia   . Hypertension   . Hyperthyroidism   . Neuropathic pain   . Primary localized osteoarthritis of right knee   . Stomach ulcer   . Tremor, essential     Past Surgical History:  Procedure Laterality Date  . COLONOSCOPY WITH PROPOFOL N/A 09/14/2016   Procedure: COLONOSCOPY WITH PROPOFOL;  Surgeon: Manya Silvas, MD;  [facility] ENDOSCOPY;  Service: Endoscopy;  Laterality: N/A;  . esophageal stretch    . JOINT REPLACEMENT    . LUMBAR LAMINECTOMY/DECOMPRESSION MICRODISCECTOMY Left 02/03/2016   Procedure: LEFT L5-S1 DISKECTOMY;  Surgeon: Leeroy Cha, MD;  Location: [place] NEURO ORS;  Service: Neurosurgery;  Laterality: Left;  LEFT L5-S1 DISKECTOMY  . TONSILLECTOMY    . TOTAL KNEE ARTHROPLASTY Right 11/15/2014   Procedure: TOTAL KNEE ARTHROPLASTY;  Surgeon: Elsie Saas, MD;  Location: [place];  Service: Orthopedics;  Laterality: Right;    Family History  Problem Relation Age of Onset  . Heart attack Mother   . Heart disease Mother   . Hypertension Father   . Depression Father   . Kidney cancer Neg Hx   . Prostate cancer Neg Hx     Social History  Substance Use Topics  . Smoking status: Former Smoker    Packs/day: 1.00    Years: 60.00    Types: Cigarettes    Quit date: 08/2016  . Smokeless tobacco: Never Used  . 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 Marital status: Married    Spouse name: N/A  . Number of children: N/A  . Years of education: N/A   Social History Main Topics  . Smoking status: Former Smoker    Packs/day: 1.00    Years: 60.00    Types: Cigarettes    Quit date: 08/2016  . Smokeless tobacco: Never Used  . Alcohol use No      Comment: recovery alcoholic 30 yrs sober  . Drug use: No  . Sexual activity: Not Currently   Other Topics Concern  . None   Social History Narrative  . None     BP (!) 102/54   Pulse 77   Temp 97.9 F (36.6 C)   Resp 20   Ht 5\' 11"  (1.803 m)   Wt 185 lb (83.9 kg)   SpO2 96%   BMI 25.80 kg/m    BP Readings from Last 3 Encounters:  10/18/16 (!) 102/54  09/14/16 117/60  09/04/16 (!) 148/88     Wt Readings from Last 3 Encounters:  10/18/16 185 lb (83.9 kg)  09/04/16 185 lb (83.9 kg)  08/27/16 188 lb (85.3 kg)     ----------------------------------------------------------------------------------------------------------------------  ROS Review of Systems  Cardiac: No angina or shortness of breath GI: No constipationOtherwise no change in review of systems.  Objective:  BP (!) 102/54   Pulse 77   Temp 97.9 F (36.6 C)   Resp 20   Ht 5\' 11"  (1.803 m)   Wt 185 lb (83.9 kg)   SpO2 96%   BMI 25.80 kg/m   Physical Exam Patient is alert oriented cooperative compliant Heart is regular rate and rhythm He has some paraspinous muscle tenderness primarily worse on the left side. He is still having pain with extension at the low back and left lateral rotation however this does seem improved over his baseline. Muscle tone and bulk is good  Assessment &  Plan:   [place] was seen today for hip pain.  Diagnoses and all orders for this visit:  Facet arthritis of lumbar region ([place]) -     triamcinolone acetonide (KENALOG-40) injection 40 mg; 1 mL (40 mg total) by Other route once. -     sodium chloride flush (NS) 0.9 % injection 10 mL; 10 mLs by Other route once. -     ropivacaine (PF) 2 mg/mL (0.2%) (NAROPIN) injection 10 mL; 10 mLs by Epidural route once. -     lidocaine (PF) (XYLOCAINE) 1 % injection 5 mL; Inject 5 mLs into the skin once. -     lactated ringers infusion 1,000 mL; Inject 1,000 mLs into the vein continuous. -     LUMBAR FACET(MEDIAL BRANCH NERVE BLOCK)  MBNB  Sciatica of left side associated with disorder of lumbar spine  Chronic left-sided low back pain with left-sided sciatica -     LUMBAR FACET(MEDIAL BRANCH NERVE BLOCK) MBNB  Other orders -     Discontinue: oxyCODONE-acetaminophen (PERCOCET) 7.5-325 MG tablet; Take 1 tablet by mouth 3 (three) times daily. -     oxyCODONE-acetaminophen (PERCOCET) 7.5-325 MG tablet; Take 1 tablet by mouth 3 (three) times daily.     ----------------------------------------------------------------------------------------------------------------------  Problem List Items Addressed This Visit      Other   Chronic lumbar pain   Relevant Medications   oxyCODONE-acetaminophen (PERCOCET) 7.5-325 MG tablet   triamcinolone acetonide (KENALOG-40) injection 40 mg (Completed)    Other Visit Diagnoses    Facet arthritis of lumbar region ([place])    -  Primary   Relevant Medications   oxyCODONE-acetaminophen (PERCOCET) 7.5-325 MG tablet   triamcinolone acetonide (KENALOG-40) injection 40 mg (Completed)   sodium chloride flush (NS) 0.9 % injection 10 mL   ropivacaine (PF) 2 mg/mL (0.2%) (NAROPIN) injection 10 mL (Completed)   lidocaine (PF) (XYLOCAINE) 1 % injection 5 mL   lactated ringers infusion 1,000 mL   Sciatica of left side associated with disorder of lumbar spine          ----------------------------------------------------------------------------------------------------------------------  1. Sciatica of left side associated with disorder of lumbar spine Continue back stretching strengthening exercises. We'll have him return to clinic in 2 months for reevaluation and medication refill. In 3 months from now we may consider a lumbar epidural steroid injection secondary to the persistent left calf cramping and pain that he has experienced. No new changes in lower extremity strength are noted. 2. Left side lumbar facet arthropathy. We will proceed with a repeat left lumbar diagnostic facet block today.  The risks and benefits of an reviewed in detail. 3. Chronic left-sided low back pain with left-sided sciatica We will allow him a fourth tablet on select days with the total dosing of 100 tablets per month. Unfortunately he has failed conservative therapy and continues to require medication management to get some relief. His wife does assist him with his medication management.  4. History of alcohol abuse and he continues to abstain. ----------------------------------------------------------------------------------------------------------------------  I have discontinued Mr. Monacelli's potassium chloride SA. I am also having him maintain his divalproex, donepezil, colestipol, aspirin EC, tamsulosin, propranolol, finasteride, acetaminophen, metoprolol succinate, omeprazole, memantine, primidone, gabapentin, lisinopril-hydrochlorothiazide, buPROPion, glimepiride, metFORMIN, fenofibrate, albuterol, QUEtiapine, simvastatin, JANUVIA, and oxyCODONE-acetaminophen. We administered triamcinolone acetonide and ropivacaine (PF) 2 mg/mL (0.2%).   Meds ordered this encounter  Medications  . DISCONTD: oxyCODONE-acetaminophen (PERCOCET) 7.5-325 MG tablet    Sig: Take 1 tablet by mouth 3 (three) times daily.    Dispense:  100 tablet  Refill:  0    Dont fill until [id number]  . oxyCODONE-acetaminophen (PERCOCET) 7.5-325 MG tablet    Sig: Take 1 tablet by mouth 3 (three) times daily.    Dispense:  100 tablet    Refill:  0    Dont fill until [id number]  . triamcinolone acetonide (KENALOG-40) injection 40 mg  . sodium chloride flush (NS) 0.9 % injection 10 mL  . ropivacaine (PF) 2 mg/mL (0.2%) (NAROPIN) injection 10 mL  . lidocaine (PF) (XYLOCAINE) 1 % injection 5 mL  . lactated ringers infusion 1,000 mL     Procedure: #2 Left lumbar facet at L3-S1 medial branch block under fluoroscopic guidance with moderate sedation   Patient was taken to the fluoroscopy suite and placed in prone position.  Versed was  titrated for moderate sedation. Vital signs were stable throughout the procedure. The  overlying area of skin on the  left side was prepped with Betadine 3 and strict aseptic technique was utilized throughout the procedure. Flouroscopy was used to I identify the areas overlying the aforementioned facets at L3-4  L4-5 and  L5-S1. 1% lidocaine 1 cc was infiltrated subcutaneously and into the fascia with a 25-gauge needle at each of these sites. I then advanced a 22-gauge 3-1/2 inch Quinckie needle with the needle tip to lie at the "[facility]" portion of the [employer] dog". There was negative aspiration for heme or CSF and  no paresthesia. I then injected 2 cc of ropivacaine 0.2% mixed with10 mg of triamcinolone at each of the aforementioned sites. These needles were withdrawn and the L5-S1 needle was redirected towards the S1 posterior foramen. This was done without  paresthesia, there was negative aspiration and 2 cc of this same mixture was injected at this site. The patient was convalesced discharged home stable condition for follow-up as mentioned.   Follow-up: Return for evaluation, med refill.    Molli Barrows, MD @DATE @  The [place] practitioner database for opioid medications on this patient has been reviewed by me and my staff   Greater than 50% of the total encounter time was spent in counseling and / or coordination of care.     This dictation was performed utilizing Systems analyst.  Please excuse any unintentional or mistaken typographical errors as a result.

## 2016-10-19 NOTE — Patient Outreach (Signed)
[facility]) Care Management  10/19/2016  James Hood December 12, 1940 [id number]  Patient was referred to [place] Resident by patient's PCP for patient cost concerns with Januvia.  This [place] is providing coverage for [place] Resident.   Successful phone outreach to patient, HIPAA details verified, explained purpose of call and THN to patient and patient consented to speak with [facility] Pharmacist.  He reports he is in the coverage gap on his Part D plan.  He reports he has [employer] Medication Advantage Plan:  Medication assistance:    Patient reports he is looking into getting Januvia from [facility] patient it would not be recommended for him to obtain medication from outside of the [place].    Discussed Programmer, applications Help----he reports income exceeds requirements.   Discussed [employer] Patient Assistance program.  Patient believes household gross income may meet requirements.    He reports he has an appointment with PCP next week---advised patient application will be mailed to him and [facility] Pharmacist will get in touch with PCP office to send a copy to them.    Plan:  Recommend patient apply to [employer] patient assistance program to see if he is eligible for manufacturer patient assistance.   [employer] requires an original, completed, application be mailed---they do not accept faxes and incomplete applications are mailed back to prescriber/patient.    Karrie Meres, PharmD, [place] [phone]

## 2016-10-19 NOTE — Telephone Encounter (Signed)
Post procedure phone call.  Patient states he is in his usual pain.  Encouraged to put heat on his back today and to call for any questions or concerns.

## 2016-10-22 NOTE — Telephone Encounter (Signed)
Per front staff he picked it up on Friday, thanks

## 2016-10-24 ENCOUNTER — Encounter: Payer: Self-pay | Admitting: Family Medicine

## 2016-10-24 ENCOUNTER — Ambulatory Visit (INDEPENDENT_AMBULATORY_CARE_PROVIDER_SITE_OTHER): Payer: PPO | Admitting: Family Medicine

## 2016-10-24 VITALS — BP 130/70 | HR 94 | Temp 98.7°F | Wt 179.6 lb

## 2016-10-24 DIAGNOSIS — K529 Noninfective gastroenteritis and colitis, unspecified: Secondary | ICD-10-CM

## 2016-10-24 DIAGNOSIS — I10 Essential (primary) hypertension: Secondary | ICD-10-CM | POA: Diagnosis not present

## 2016-10-24 DIAGNOSIS — E1142 Type 2 diabetes mellitus with diabetic polyneuropathy: Secondary | ICD-10-CM | POA: Diagnosis not present

## 2016-10-24 DIAGNOSIS — F329 Major depressive disorder, single episode, unspecified: Secondary | ICD-10-CM | POA: Diagnosis not present

## 2016-10-24 DIAGNOSIS — F32A Depression, unspecified: Secondary | ICD-10-CM

## 2016-10-24 DIAGNOSIS — F419 Anxiety disorder, unspecified: Secondary | ICD-10-CM | POA: Diagnosis not present

## 2016-10-24 NOTE — Assessment & Plan Note (Signed)
At goal. Check CMP. Continue current medications.

## 2016-10-24 NOTE — Assessment & Plan Note (Signed)
Check A1c. Continue current medications. 

## 2016-10-24 NOTE — Patient Instructions (Signed)
Nice to see you. We'll do lab work and contact you with the results. Please monitor your anxiety and depression and if it worsens please let us know. If you develop thoughts of harming yourself or others please seek medical attention immediately.

## 2016-10-24 NOTE — Progress Notes (Signed)
  Tommi Rumps, MD Phone: [phone]  James Hood is a 76 y.o. male who presents today for f/u.  HYPERTENSION  Disease Monitoring  Home BP Monitoring normal per report Chest pain- no    Dyspnea- no Medications  Compliance-  Taking lisinopril, hctz, metoprolol  Edema- no  DIABETES Disease Monitoring: Blood Sugar ranges-not checking Polyuria/phagia/dipsia- no       Medications: Compliance- taking Amaryl, metformin, Januvia Hypoglycemic symptoms- no  Chronic diarrhea: Has been evaluated by GI. In return colonoscopy and has been prescribed Entocort. He has not started this yet. He notes his bowel movements are unchanged and persistently or diarrhea. No blood in his stool. No abdominal pain. Worse typically in the morning and late at night.  Anxiety/depression: Notes this is bad due to feeling sick every day with the diarrhea. He is on Wellbutrin. He is on Depakote and Seroquel to a neurologist. He does note some thoughts of being better off not having to deal with this though no SI, intent, or plan to harm himself.  PMH: Former smoker   ROS see history of present illness  Objective  Physical Exam Vitals:   10/24/16 1605  BP: 130/70  Pulse: 94  Temp: 98.7 F (37.1 C)    BP Readings from Last 3 Encounters:  10/24/16 130/70  10/18/16 (!) 102/54  09/14/16 117/60   Wt Readings from Last 3 Encounters:  10/24/16 179 lb 9.6 oz (81.5 kg)  10/18/16 185 lb (83.9 kg)  09/04/16 185 lb (83.9 kg)    Physical Exam  Constitutional: No distress.  Cardiovascular: Normal rate, regular rhythm and normal heart sounds.   Pulmonary/Chest: Effort normal and breath sounds normal.  Abdominal: Soft. Bowel sounds are normal. He exhibits no distension. There is no tenderness.  Musculoskeletal: He exhibits no edema.  Neurological: He is alert. Gait normal.  Skin: Skin is warm and dry. He is not diaphoretic.     Assessment/Plan: Please see individual problem list.  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 Discussed proceeding with treatment per GI.  Hypertension At goal. Check CMP. Continue current medications.  Type 2 diabetes mellitus (HCC) Check A1c. Continue current medications.  Anxiety and depression Worsened with this chronic issues with diarrhea. He would prefer to see how he does once they have tried treatment for his diarrhea. He will continue on Wellbutrin. Given return precautions.   Orders Placed This Encounter  Procedures  . Comp Met (CMET)  . HgB A1c   Tommi Rumps, MD [place]

## 2016-10-24 NOTE — Assessment & Plan Note (Signed)
Worsened with this chronic issues with diarrhea. He would prefer to see how he does once they have tried treatment for his diarrhea. He will continue on Wellbutrin. Given return precautions.

## 2016-10-24 NOTE — Assessment & Plan Note (Signed)
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 Discussed proceeding with treatment per GI.

## 2016-10-25 ENCOUNTER — Other Ambulatory Visit: Payer: Self-pay | Admitting: Pharmacist

## 2016-10-25 LAB — COMPREHENSIVE METABOLIC PANEL
ALT: 19 U/L (ref 0–53)
AST: 27 U/L (ref 0–37)
Albumin: 4.3 g/dL (ref 3.5–5.2)
Alkaline Phosphatase: 29 U/L — ABNORMAL LOW (ref 39–117)
BUN: 22 mg/dL (ref 6–23)
CO2: 32 mEq/L (ref 19–32)
Calcium: 10.2 mg/dL (ref 8.4–10.5)
Chloride: 99 mEq/L (ref 96–112)
Creatinine, Ser: 1.06 mg/dL (ref 0.40–1.50)
GFR: 72.05 mL/min (ref >60.00)
Glucose, Bld: 105 mg/dL — ABNORMAL HIGH (ref 70–99)
Potassium: 3.4 mEq/L — ABNORMAL LOW (ref 3.5–5.1)
Sodium: 139 mEq/L (ref 135–145)
Total Bilirubin: 0.3 mg/dL (ref 0.2–1.2)
Total Protein: 7 g/dL (ref 6.0–8.3)

## 2016-10-25 LAB — HEMOGLOBIN A1C: Hgb A1c MFr Bld: 6.3 % (ref 4.6–6.5)

## 2016-10-25 NOTE — Patient Outreach (Signed)
[facility]) Care Management  10/25/2016  James Hood Dec 27, 1940 [id number]  Message received from patient he completed his portion of [employer] Patient Assistance form and took to his PCP office yesterday.    Returned call to patient.  Patient reports he believes household income was under program requirements.  Counseled patient will follow-up with PCP office next week to see if they completed and mailed application.   Plan:  Will continue to follow up with patient regarding [employer] Patient Assistance for [employer].   Karrie Meres, PharmD, [place] [phone]

## 2016-11-02 ENCOUNTER — Other Ambulatory Visit: Payer: Self-pay | Admitting: Family Medicine

## 2016-11-07 ENCOUNTER — Other Ambulatory Visit: Payer: Self-pay | Admitting: Pharmacist

## 2016-11-07 NOTE — Patient Outreach (Signed)
[facility]) Care Management  11/07/2016  James Hood Feb 09, 1941 [id number]  Received a message from patient regarding patient assistance.    Successful phone follow-up to patient.  Advised patient his application for [employer] Patient Assistance was received back from PCP, but needed another PCP signature.  [place] Resident took application to PCP and got application completed.   Advised patient application will be mailed to [employer] Patient Assistance for evaluation.  Patient asks about cyclobenzaprine cost, he reports not for him---counseled patient unable to discuss detailed medication information about patient's other than him---but he could look at [facility] $4 list.    Plan:  Will continue to follow-up his application for [employer] Patient Assistance.    He was counseled he may receive application back in mail with letter to from [employer] Patient Assistance.    Karrie Meres, PharmD, [place] [phone]

## 2016-11-14 ENCOUNTER — Ambulatory Visit (INDEPENDENT_AMBULATORY_CARE_PROVIDER_SITE_OTHER): Payer: PPO

## 2016-11-14 VITALS — BP 124/62 | HR 81 | Temp 98.3°F | Resp 14 | Ht 69.0 in | Wt 181.8 lb

## 2016-11-14 DIAGNOSIS — Z0000 Encounter for general adult medical examination without abnormal findings: Secondary | ICD-10-CM | POA: Diagnosis not present

## 2016-11-14 NOTE — Progress Notes (Signed)
Subjective:   James Hood is a 76 y.o. male who presents for an Initial Medicare Annual Wellness Visit.  Review of Systems  No ROS.  Medicare Wellness Visit. Additional risk factors are reflected in the social history. Cardiac Risk Factors include: advanced age (>28men, >33 women);male gender;hypertension;diabetes mellitus    Objective:    Today's Vitals   11/14/16 1603 11/14/16 1622  BP: 124/62   Pulse: 81   Resp: 14   Temp: 98.3 F (36.8 C)   TempSrc: Oral   SpO2: 97%   Weight: 181 lb 12.8 oz (82.5 kg)   Height: 5\' 9"  (1.753 m)   PainSc:  5    Body mass index is 26.85 kg/m.  Current Medications (verified) Outpatient Encounter Prescriptions as of 11/14/2016  Medication Sig  . acetaminophen (TYLENOL) 500 MG tablet Take 1,000 mg by mouth as needed.   Marland Kitchen aspirin EC 81 MG tablet Take 81 mg by mouth every morning.   Marland Kitchen buPROPion (ZYBAN) 150 MG 12 hr tablet Take 1 tablet (150 mg total) by mouth 2 (two) times daily.  . colestipol (COLESTID) 1 g tablet Take 2 g by mouth daily.   . diphenoxylate-atropine (LOMOTIL) 2.5-0.025 MG tablet TAKE ONE TABLET BY MOUTH FOUR TIMES A DAY AS NEEDED FOR DIARRHEA  . divalproex (DEPAKOTE ER) 250 MG 24 hr tablet Take 250 mg by mouth 2 (two) times daily. Pt takes 250mg  in the morning, and 250mg  in the afternoon  . donepezil (ARICEPT) 10 MG tablet Take 10 mg by mouth at bedtime.  . fenofibrate (TRICOR) 145 MG tablet TAKE ONE TABLET BY MOUTH DAILY  . finasteride (PROSCAR) 5 MG tablet Take 1 tablet (5 mg total) by mouth daily.  Marland Kitchen gabapentin (NEURONTIN) 600 MG tablet Take 2 tablets (1,200 mg total) by mouth 3 (three) times daily.  Marland Kitchen glimepiride (AMARYL) 4 MG tablet Take 1 tablet (4 mg total) by mouth 2 (two) times daily.  Marland Kitchen lisinopril-hydrochlorothiazide (PRINZIDE,ZESTORETIC) 20-25 MG tablet Take 1 tablet by mouth 2 (two) times daily.  Marland Kitchen MELATONIN PO Take 20 mg by mouth at bedtime.  . memantine (NAMENDA) 10 MG tablet Take 10 mg by mouth 2 (two)  times daily.   . metFORMIN (GLUCOPHAGE) 500 MG tablet TAKE TWO TABLETS BY MOUTH TWICE A DAY WITH A MEAL  . metoprolol succinate (TOPROL-XL) 25 MG 24 hr tablet Take 25 mg by mouth daily.   Marland Kitchen omeprazole (PRILOSEC) 20 MG capsule 20 mg 2 (two) times daily before a meal.   . oxyCODONE-acetaminophen (PERCOCET) 7.5-325 MG tablet Take 1 tablet by mouth 3 (three) times daily.  . primidone (MYSOLINE) 50 MG tablet TAKE 5 TABLETS IN THE MORNING AND 3 TABLETS IN THE EVENING  . propranolol (INDERAL) 60 MG tablet Take 60 mg by mouth 2 (two) times daily.   . QUEtiapine (SEROQUEL) 25 MG tablet TAKE THREE TO FOUR TABLETS BY MOUTH AT NIGHT  . simvastatin (ZOCOR) 40 MG tablet TAKE 1 TABLET (40 MG TOTAL) BY MOUTH DAILY.  . sitaGLIPtin (JANUVIA) 100 MG tablet Take 1 tablet (100 mg total) by mouth daily.  . tamsulosin (FLOMAX) 0.4 MG CAPS capsule Take 0.4 mg by mouth daily after supper.   . [DISCONTINUED] albuterol (PROVENTIL HFA;VENTOLIN HFA) 108 (90 Base) MCG/ACT inhaler Inhale 2 puffs into the lungs every 6 (six) hours as needed for wheezing or shortness of breath.   No facility-administered encounter medications on file as of 11/14/2016.     Allergies (verified) No known allergies   History: Past  Medical History:  Diagnosis Date  . Anxiety   . Anxiety and depression   . Arthritis   . Colon polyps   . Dementia   . Depression   . Diabetes mellitus type 2 in nonobese (HCC)   . GERD (gastroesophageal reflux disease)   . Headache   . 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[place])   . History of hiatal hernia   . Hypercholesteremia   . Hypertension   . Hyperthyroidism   . Neuropathic pain   . Primary localized osteoarthritis of right knee   . Stomach ulcer   . Tremor, essential    Past Surgical History:  Procedure Laterality Date  . COLONOSCOPY WITH PROPOFOL N/A 09/14/2016   Procedure: COLONOSCOPY WITH PROPOFOL;  Surgeon: Manya Silvas, MD;  Location: [facility] ENDOSCOPY;  Service: Endoscopy;  Laterality: N/A;  .  esophageal stretch    . JOINT REPLACEMENT    . LUMBAR LAMINECTOMY/DECOMPRESSION MICRODISCECTOMY Left 02/03/2016   Procedure: LEFT L5-S1 DISKECTOMY;  Surgeon: Leeroy Cha, MD;  Location[facility];  Service: Neurosurgery;  Laterality: Left;  LEFT L5-S1 DISKECTOMY  . TONSILLECTOMY    . TOTAL KNEE ARTHROPLASTY Right 11/15/2014   Procedure: TOTAL KNEE ARTHROPLASTY;  Surgeon: Elsie Saas, MD;  Location: [place];  Service: Orthopedics;  Laterality: Right;   Family History  Problem Relation Age of Onset  . Heart attack Mother   . Heart disease Mother   . Hypertension Father   . Depression Father   . Kidney cancer Neg Hx   . Prostate cancer Neg Hx    Social History   Occupational History  . Not on file.   Social History Main Topics  . Smoking status: Former Smoker    Packs/day: 1.00    Years: 60.00    Types: Cigarettes    Quit date: 08/2016  . Smokeless tobacco: Never Used  . 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 Drug use: No  . Sexual activity: Not Currently   Tobacco Counseling Counseling given: Not Answered   Activities of Daily Living In your present state of health, do you have any difficulty performing the following activities: 11/14/2016 08/10/2016  Hearing? N -  Vision? N -  Difficulty concentrating or making decisions? N -  Walking or climbing stairs? Y -  Dressing or bathing? N -  Doing errands, shopping? N N  Preparing Food and eating ? N -  Using the Toilet? N -  In the past six months, have you accidently leaked urine? N -  Do you have problems with loss of bowel control? Y -  Managing your Medications? N -  Managing your Finances? Y -  Housekeeping or managing your Housekeeping? Y -  Some recent data might be hidden    Immunizations and Health Maintenance Immunization History  Administered Date(s) Administered  . Influenza,inj,Quad PF,36+ Mos 03/13/2016  . Influenza-Unspecified 01/21/2015  . Pneumococcal Conjugate-13  02/07/2015  . Tdap 09/16/2015  . Zoster 09/16/2015   There are no preventive care reminders to display for this patient.  Patient Care Team: Leone Haven, MD as PCP - General (Family Medicine) Ruedinger, Drexel Iha, [facility] as [place] Management (Pharmacist)  Indicate any recent Medical Services you may have received from other than [facility] providers in the past year (date may be approximate).    Assessment:   This is a routine wellness examination for [place]. The goal of the wellness visit is to assist the patient how  to close the gaps in care and create a preventative care plan for the patient.   The roster of all physicians providing medical care to patient is listed in the Snapshot section of the chart.  Osteoporosis risk reviewed.    Safety issues reviewed; Smoke and carbon monoxide detectors in the home. No firearms in the home.  Wears seatbelts when driving or riding with others. Patient does wear sunscreen or protective clothing when in direct sunlight. No violence in the home.  Patient is alert, normal appearance, oriented to person/place/and time.  Correctly identified the president of the [place], recall of 3/3 words, and performing simple calculations. Displays appropriate judgement and can read correct time from watch face.   No new identified risk were noted.  No failures at ADL's or IADL's.  Ambulates with cane as needed.  BMI- discussed the importance of a healthy diet, water intake and the benefits of aerobic exercise. Educational material provided.   Daily fluid intake:  0 cups of caffeine, 6 cups of water  Dental- every 12 months.  Sleep patterns- Sleeps 7-8 hours at night.  Wakes feeling rested.  Health maintenance gaps- closed.  Patient Concerns: None at this time. Follow up with PCP as needed.  Hearing/Vision screen Hearing Screening Comments: Patient is able to hear conversational tones without difficulty.  No issues  reported.   Vision Screening Comments: Followed by [facility] (Dr. Jeni Salles) Wears corrective lenses Last OV 09/2016 Visual acuity not assessed per patient preference since they have regular follow up with the ophthalmologist  Dietary issues and exercise activities discussed: Current Exercise Habits: The patient does not participate in regular exercise at present, Exercise limited by: neurologic condition(s)  Goals    . Increase physical activity          Attend the gym and use machines with no weight.  Mild pace.      Depression Screen PHQ 2/9 Scores 11/14/2016 10/18/2016 09/04/2016 08/23/2016  PHQ - 2 Score 0 0 0 0  PHQ- 9 Score - - - -    Fall Risk Fall Risk  11/14/2016 10/18/2016 09/04/2016 08/23/2016 08/06/2016  Falls in the past year? Yes Yes Yes Yes No  Number falls in past yr: 1 1 1 1  -  Injury with Fall? No No No No -  Risk Factor Category  High Fall Risk - - - -  Risk for fall due to : Impaired balance/gait;History of fall(s) Impaired mobility Impaired mobility History of fall(s);Impaired balance/gait -  Follow up Falls prevention discussed;Education provided Falls evaluation completed Falls evaluation completed Falls evaluation completed -    Cognitive Function: MMSE - Mini Mental State Exam 11/14/2016  Orientation to time 5  Orientation to Place 5  Registration 3  Attention/ Calculation 5  Recall 3  Language- name 2 objects 2  Language- repeat 1  Language- follow 3 step command 3  Language- read & follow direction 1  Write a sentence 1  Copy design 1  Total score 30        Screening Tests Health Maintenance  Topic Date Due  . INFLUENZA VACCINE  12/19/2016  . FOOT EXAM  03/13/2017  . HEMOGLOBIN A1C  04/25/2017  . OPHTHALMOLOGY EXAM  09/10/2017  . TETANUS/TDAP  09/15/2025  . PNA vac Low Risk Adult  Completed        Plan:    End of life planning; Advance aging; Advanced directives discussed. Copy of current HCPOA/Living Will on file.    I  have personally  reviewed and noted the following in the patient's chart:   . Medical and social history . Use of alcohol, tobacco or illicit drugs  . Current medications and supplements . Functional ability and status . Nutritional status . Physical activity . Advanced directives . List of other physicians . Hospitalizations, surgeries, and ER visits in previous 12 months . Vitals . Screenings to include cognitive, depression, and falls . Referrals and appointments  In addition, I have reviewed and discussed with patient certain preventive protocols, quality metrics, and best practice recommendations. A written personalized care plan for preventive services as well as general preventive health recommendations were provided to patient.     Varney Biles, LPN   [phone]/[id number]

## 2016-11-14 NOTE — Patient Instructions (Addendum)
  James Hood , Thank you for taking time to come for your Medicare Wellness Visit. I appreciate your ongoing commitment to your health goals. Please review the following plan we discussed and let me know if I can assist you in the future.   Follow up with Dr. Caryl Bis as needed.    Contact your local fire department for assistance with smoke detectors.  Call the office [phone] and give message to Janett Billow (Dr. Ellen Henri assistant) with new medication currently taking for diarrhea.  Have a great day!  These are the goals we discussed: Goals    . Increase physical activity          Attend the gym and use machines with no weight.  Mild pace.       This is a list of the screening recommended for you and due dates:  Health Maintenance  Topic Date Due  . Flu Shot  12/19/2016  . Complete foot exam   03/13/2017  . Hemoglobin A1C  04/25/2017  . Eye exam for diabetics  09/10/2017  . Tetanus Vaccine  09/15/2025  . Pneumonia vaccines  Completed

## 2016-11-15 ENCOUNTER — Telehealth: Payer: Self-pay | Admitting: *Deleted

## 2016-11-15 NOTE — Telephone Encounter (Signed)
Added to med list

## 2016-11-15 NOTE — Telephone Encounter (Signed)
FYI patient would like to add a medication to his medication list : Budesonide 3 mg 3 capsules once daily, prescribed by Dr. Vira Agar from [facility]

## 2016-11-16 ENCOUNTER — Other Ambulatory Visit: Payer: Self-pay | Admitting: Family Medicine

## 2016-11-16 MED ORDER — TAMSULOSIN HCL 0.4 MG PO CAPS: 0.4000 mg | ORAL_CAPSULE | Freq: Every day | ORAL | 3 refills | Status: DC

## 2016-11-17 ENCOUNTER — Other Ambulatory Visit: Payer: Self-pay | Admitting: Family Medicine

## 2016-11-20 ENCOUNTER — Telehealth: Payer: Self-pay

## 2016-11-20 NOTE — Telephone Encounter (Signed)
Pt is in pain meds are not helping. Pain goes from left hip to ankle on the left side

## 2016-11-20 NOTE — Telephone Encounter (Signed)
Attempted to call patient, message left. 

## 2016-11-27 ENCOUNTER — Other Ambulatory Visit: Payer: Self-pay | Admitting: Pharmacist

## 2016-11-27 NOTE — Telephone Encounter (Signed)
Patient still having pain and wants to have a procedure when he comes in for procedure, meds are not helping at all with controlling pain. He has [employer] and will have to be prior auth before appt. Patient would like a call back to him regarding this information next week once Dr. Andree Elk is back.

## 2016-11-27 NOTE — Patient Outreach (Signed)
[facility]) Care Management  11/27/2016  KORE MADLOCK 1941/04/25 [id number]  Successful follow-up call to patient regarding his application for [employer] Patient Assistance.   Patient reports he received appeals letter and application back in the mail last week from [employer] and he needs to complete and return to patient assistance program.    Patient reports he plans to complete and mail application back to [employer] Patient [employer] for evaluation.  He was encouraged to do so as soon as possible.    Plan:  Will follow-up via phone with patient within the next month.    Karrie Meres, PharmD, [place] [phone]

## 2016-12-03 ENCOUNTER — Other Ambulatory Visit: Payer: Self-pay | Admitting: *Deleted

## 2016-12-03 DIAGNOSIS — M5126 Other intervertebral disc displacement, lumbar region: Secondary | ICD-10-CM

## 2016-12-03 NOTE — Telephone Encounter (Signed)
Spoke with Dr. Andree Elk, ok to have lesi. Please get pa and schedule.

## 2016-12-07 ENCOUNTER — Other Ambulatory Visit: Payer: Self-pay | Admitting: Family Medicine

## 2016-12-10 ENCOUNTER — Other Ambulatory Visit: Payer: Self-pay | Admitting: Pharmacist

## 2016-12-10 DIAGNOSIS — K52832 Lymphocytic colitis: Secondary | ICD-10-CM | POA: Diagnosis not present

## 2016-12-10 DIAGNOSIS — R197 Diarrhea, unspecified: Secondary | ICD-10-CM | POA: Diagnosis not present

## 2016-12-10 NOTE — Patient Outreach (Signed)
[facility]) Care Management  12/10/2016  DARIS HARKINS 1941-03-24 [id number]  Follow-up call to patient regarding [employer] Patient Assistance application.   Patient reports he mailed back attestation form and application ~11/29/17 to [employer] Patient Assistance program.  He reports he has not heard anything further.   Placed call to [employer] Patient Assistance program---representative reports application was approved for Januvia 12/04/16---call was transferred to dispensing pharmacy to further determine status.  Dispensing pharmacy reports order was processed and should be received by patient in 7-10 business days.   Phone call back to patient update him.  He had no further questions at this time.   Plan:  Patient to call [facility] Pharmacist when he receives medication.   Will make phone outreach to patient in 7-10 business days if have not heard from patient.   Karrie Meres, PharmD, [place] [phone]

## 2016-12-12 ENCOUNTER — Other Ambulatory Visit: Payer: Self-pay | Admitting: Family Medicine

## 2016-12-12 ENCOUNTER — Ambulatory Visit
Admission: RE | Admit: 2016-12-12 | Discharge: 2016-12-12 | Disposition: A | Discharge status: Home / Self Care | Payer: PPO | Source: Ambulatory Visit | Attending: Anesthesiology | Admitting: Anesthesiology

## 2016-12-12 ENCOUNTER — Ambulatory Visit (HOSPITAL_BASED_OUTPATIENT_CLINIC_OR_DEPARTMENT_OTHER): Payer: PPO | Admitting: Anesthesiology

## 2016-12-12 ENCOUNTER — Other Ambulatory Visit: Payer: Self-pay | Admitting: Anesthesiology

## 2016-12-12 ENCOUNTER — Encounter: Payer: Self-pay | Admitting: Anesthesiology

## 2016-12-12 VITALS — BP 106/63 | HR 65 | Temp 96.8°F | Resp 16 | Ht 70.0 in | Wt 182.0 lb

## 2016-12-12 DIAGNOSIS — Z96651 Presence of right artificial knee joint: Secondary | ICD-10-CM | POA: Diagnosis not present

## 2016-12-12 DIAGNOSIS — R51 Headache: Secondary | ICD-10-CM | POA: Insufficient documentation

## 2016-12-12 DIAGNOSIS — Z7982 Long term (current) use of aspirin: Secondary | ICD-10-CM | POA: Insufficient documentation

## 2016-12-12 DIAGNOSIS — Z79899 Other long term (current) drug therapy: Secondary | ICD-10-CM | POA: Diagnosis not present

## 2016-12-12 DIAGNOSIS — R52 Pain, unspecified: Secondary | ICD-10-CM

## 2016-12-12 DIAGNOSIS — M4696 Unspecified inflammatory spondylopathy, lumbar region: Secondary | ICD-10-CM | POA: Diagnosis not present

## 2016-12-12 DIAGNOSIS — R200 Anesthesia of skin: Secondary | ICD-10-CM | POA: Diagnosis not present

## 2016-12-12 DIAGNOSIS — G8929 Other chronic pain: Secondary | ICD-10-CM

## 2016-12-12 DIAGNOSIS — M5126 Other intervertebral disc displacement, lumbar region: Secondary | ICD-10-CM

## 2016-12-12 DIAGNOSIS — Z9889 Other specified postprocedural states: Secondary | ICD-10-CM | POA: Insufficient documentation

## 2016-12-12 DIAGNOSIS — F329 Major depressive disorder, single episode, unspecified: Secondary | ICD-10-CM | POA: Diagnosis not present

## 2016-12-12 DIAGNOSIS — Z818 Family history of other mental and behavioral disorders: Secondary | ICD-10-CM | POA: Insufficient documentation

## 2016-12-12 DIAGNOSIS — M5442 Lumbago with sciatica, left side: Secondary | ICD-10-CM

## 2016-12-12 DIAGNOSIS — Z8249 Family history of ischemic heart disease and other diseases of the circulatory system: Secondary | ICD-10-CM | POA: Insufficient documentation

## 2016-12-12 DIAGNOSIS — E0590 Thyrotoxicosis, unspecified without thyrotoxic crisis or storm: Secondary | ICD-10-CM | POA: Insufficient documentation

## 2016-12-12 DIAGNOSIS — R197 Diarrhea, unspecified: Secondary | ICD-10-CM | POA: Diagnosis not present

## 2016-12-12 DIAGNOSIS — K219 Gastro-esophageal reflux disease without esophagitis: Secondary | ICD-10-CM | POA: Insufficient documentation

## 2016-12-12 DIAGNOSIS — K52832 Lymphocytic colitis: Secondary | ICD-10-CM | POA: Diagnosis not present

## 2016-12-12 DIAGNOSIS — E7800 Pure hypercholesterolemia, unspecified: Secondary | ICD-10-CM | POA: Diagnosis not present

## 2016-12-12 DIAGNOSIS — M25552 Pain in left hip: Secondary | ICD-10-CM | POA: Diagnosis not present

## 2016-12-12 DIAGNOSIS — Z86010 Personal history of colonic polyps: Secondary | ICD-10-CM | POA: Insufficient documentation

## 2016-12-12 DIAGNOSIS — Z7984 Long term (current) use of oral hypoglycemic drugs: Secondary | ICD-10-CM | POA: Diagnosis not present

## 2016-12-12 DIAGNOSIS — I10 Essential (primary) hypertension: Secondary | ICD-10-CM | POA: Insufficient documentation

## 2016-12-12 DIAGNOSIS — G250 Essential tremor: Secondary | ICD-10-CM | POA: Insufficient documentation

## 2016-12-12 DIAGNOSIS — F0390 Unspecified dementia without behavioral disturbance: Secondary | ICD-10-CM | POA: Insufficient documentation

## 2016-12-12 DIAGNOSIS — Z87891 Personal history of nicotine dependence: Secondary | ICD-10-CM | POA: Insufficient documentation

## 2016-12-12 DIAGNOSIS — K449 Diaphragmatic hernia without obstruction or gangrene: Secondary | ICD-10-CM | POA: Insufficient documentation

## 2016-12-12 DIAGNOSIS — F419 Anxiety disorder, unspecified: Secondary | ICD-10-CM | POA: Diagnosis not present

## 2016-12-12 DIAGNOSIS — M539 Dorsopathy, unspecified: Secondary | ICD-10-CM

## 2016-12-12 DIAGNOSIS — M5386 Other specified dorsopathies, lumbar region: Secondary | ICD-10-CM

## 2016-12-12 DIAGNOSIS — Z8719 Personal history of other diseases of the digestive system: Secondary | ICD-10-CM | POA: Insufficient documentation

## 2016-12-12 DIAGNOSIS — E119 Type 2 diabetes mellitus without complications: Secondary | ICD-10-CM | POA: Diagnosis not present

## 2016-12-12 DIAGNOSIS — M47816 Spondylosis without myelopathy or radiculopathy, lumbar region: Secondary | ICD-10-CM

## 2016-12-12 MED ORDER — MIDAZOLAM HCL 5 MG/5ML IJ SOLN
INTRAMUSCULAR | Status: AC
  Filled 2016-12-12: qty 5

## 2016-12-12 MED ORDER — IOPAMIDOL (ISOVUE-M 200) INJECTION 41%
20.0000 mL | Freq: Once | INTRAMUSCULAR | Status: DC | PRN
  Administered 2016-12-12: 10 mL
  Filled 2016-12-12: qty 20

## 2016-12-12 MED ORDER — TRIAMCINOLONE ACETONIDE 40 MG/ML IJ SUSP
40.0000 mg | Freq: Once | INTRAMUSCULAR | Status: AC
  Administered 2016-12-12: 40 mg

## 2016-12-12 MED ORDER — OXYCODONE-ACETAMINOPHEN 7.5-325 MG PO TABS: 1.0000 | ORAL_TABLET | Freq: Three times a day (TID) | ORAL | 0 refills | Status: DC

## 2016-12-12 MED ORDER — LIDOCAINE HCL (PF) 1 % IJ SOLN
5.0000 mL | Freq: Once | INTRAMUSCULAR | Status: AC
  Administered 2016-12-12: 5 mL via SUBCUTANEOUS

## 2016-12-12 MED ORDER — TRIAMCINOLONE ACETONIDE 40 MG/ML IJ SUSP
INTRAMUSCULAR | Status: AC
  Filled 2016-12-12: qty 1

## 2016-12-12 MED ORDER — ROPIVACAINE HCL 2 MG/ML IJ SOLN
INTRAMUSCULAR | Status: AC
  Filled 2016-12-12: qty 10

## 2016-12-12 MED ORDER — SODIUM CHLORIDE 0.9% FLUSH
10.0000 mL | Freq: Once | INTRAVENOUS | Status: AC
  Administered 2016-12-12: 10 mL

## 2016-12-12 MED ORDER — ROPIVACAINE HCL 2 MG/ML IJ SOLN
10.0000 mL | Freq: Once | INTRAMUSCULAR | Status: AC
  Administered 2016-12-12: 10 mL via EPIDURAL

## 2016-12-12 MED ORDER — MIDAZOLAM HCL 2 MG/2ML IJ SOLN
5.0000 mg | Freq: Once | INTRAMUSCULAR | Status: AC
  Administered 2016-12-12: 3 mg via INTRAVENOUS

## 2016-12-12 MED ORDER — LACTATED RINGERS IV SOLN
1000.0000 mL | INTRAVENOUS | Status: DC
  Administered 2016-12-12: 1000 mL via INTRAVENOUS

## 2016-12-12 MED ORDER — LIDOCAINE HCL (PF) 1 % IJ SOLN
INTRAMUSCULAR | Status: AC
  Filled 2016-12-12: qty 5

## 2016-12-12 MED ORDER — IOPAMIDOL (ISOVUE-M 200) INJECTION 41%
INTRAMUSCULAR | Status: AC
  Filled 2016-12-12: qty 10

## 2016-12-12 MED ORDER — SODIUM CHLORIDE 0.9 % IJ SOLN
INTRAMUSCULAR | Status: AC
  Filled 2016-12-12: qty 10

## 2016-12-12 NOTE — Progress Notes (Signed)
Subjective:  Patient ID: James Hood, male    DOB: 11-22-1940  Age: 76 y.o. MRN: [id number]  CC: Hip Pain (left side hip pain down the left leg to the ankle with the ankle being the worst)   Service Provided on Last Visit: Procedure   Procedure: Left side L2-3 L3-4 L4-5 and 5 S1 lumbar medial branch block to the facet nerves under fluoroscopic guidance with moderate sedation and caudal epidural steroid No. 1  Previous Procedure: Left lumbar facet block to the medial branch under fluoroscopic as moderate sedation of 2 Previous Procedure: Left lumbar facet block via medial branch under fluoroscopic guidance with moderate sedation Previous Procedure:  None Previous PROCEDURE:Caudal epidural steroid No. 2 under fluoroscopic guidance with moderate sedation  HPI James Hood presents for reevaluation last seen a few months ago. He continues to have aching gnawing burning pain in the left lower back with radiation into the left hip and buttock region. He is also getting some numbness and tingling with aching at the left foot and calf. This is been a chronic condition and has responded favorably to caudal epidural steroids in the past regarding his foot and calf pain. He has also gotten significant improvement with lumbar facet medial branch blocks. The last one gave him significant relief but only lasted for about a week. Otherwise he is in his usual state of health at this time. He is not having any new changes in his bowel or bladder function or lower extremity strength or function but has some chronic left give way weakness following his previous back surgery.   He has been taking his medications as prescribed and using these compliantly. He generally using 3 of the Percocet 7.5 mg tablets 3 times a day with an occasional fourth tablet as needed. A stone his narcotic assessment sheet he seems to do well with this regimen and is gaining good lifestyle improvement in function with the  medicines. We will review the [facility] practitioner database information and it is appropriate.  By history  He is a pleasant 76 year old white male with a long-standing history of low back pain for over one year. He reports that he had a laminectomy in October of this year with Dr. Joya Salm. He also states that he has continued to have lancinating left posterior and lateral leg pain described as sharp nagging pulsating and throbbing it's present throughout much of the day. He rates this as a VAS of 7 best a 2 and now a 5. He feels that it is gotten worse. He has some chronic intermittent weakness of the left leg. He had an MRI that was reviewed with him today that is in follow-up to his October surgery. He also reports having had 3 epidural steroids prior to his surgery and 1 L5-S1 epidural after this. The pain is worse with bending climbing motion squatting and better with rest sleep. 5 mg of oxycodone does not give him much relief. We have reviewed his physician database and there are no concerns. He also takes Neurontin 1200 mg 3 times a day.  History James Hood has a past medical history of Anxiety; Anxiety and depression; Arthritis; Colon polyps; Dementia; Depression; Diabetes mellitus type 2 in nonobese [facility]); GERD (gastroesophageal reflux disease); Headache; 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[place]); History of hiatal hernia; Hypercholesteremia; Hypertension; Hyperthyroidism; Neuropathic pain; Primary localized osteoarthritis of right knee; Stomach ulcer; and Tremor, essential.   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   His family history includes Depression in his father; Heart attack in his mother; Heart disease in his mother; Hypertension in his father.He reports that he quit smoking about 3  months ago. His smoking use included Cigarettes. He has a 60.00 pack-year smoking history. He has never used smokeless tobacco. He reports that he does not drink alcohol or use drugs.  No results found for this or any previous visit.  [employer]ox[employer] Select 13  Date Value Ref Range Status  05/03/2016 FINAL  Final    Comment:    ==================================================================== TOXASSURE SELECT 13 (MW) ==================================================================== Test                             Result       Flag       Units Drug Present and Declared for Prescription Verification   Oxycodone                      140          EXPECTED   ng/mg creat   Oxymorphone                    127          EXPECTED   ng/mg creat   Noroxycodone                   903          EXPECTED   ng/mg creat   Noroxymorphone                 71           EXPECTED   ng/mg creat    Sources of oxycodone are scheduled prescription medications.    Oxymorphone, noroxycodone, and noroxymorphone are expected    metabolites of oxycodone. Oxymorphone is also available as a    scheduled prescription medication.   Phenobarbital                  PRESENT      EXPECTED    Phenobarbital is an expected metabolite of primidone;    Phenobarbital may also be administered as a prescription drug. Drug Absent but Declared for Prescription Verification   Desmethyldiazepam              Not Detected UNEXPECTED ng/mg creat    Desmethyldiazepam is an expected metabolite of chlordiazepoxide,    clorazepate, halazepam, and prazepam.   Hydrocodone                    Not Detected UNEXPECTED ng/mg creat   Tramadol                       Not Detected UNEXPECTED ==================================================================== Test                      Result    Flag   Units      Ref Range   Creatinine              75               mg/dL       >=20 ==================================================================== Declared Medications:  The flagging and interpretation on this report are based on the  following declared medications.  Unexpected results may arise  from  inaccuracies in the declared medications.  **Note: The testing scope of this panel includes these medications:  Clorazepate (Tranxene)  Hydrocodone (Norco)  Oxycodone  Oxycodone (Percocet)  Primidone (Mysoline)  Tramadol (Ultram)  **Note: The testing scope of this panel does not include following  reported medications:  Acetaminophen (Norco)  Acetaminophen (Percocet)  Acetaminophen (Tylenol)  Aspirin  Atropine (Lomotil)  Bupropion (Zyban)  Colestipol  Cyclobenzaprine (Flexeril)  Diphenoxylate (Lomotil)  Divalproex (Depakote)  Donepezil (Aricept)  Fenofibrate (Tricor)  Finasteride (Proscar)  Gabapentin (Neurontin)  Glimepiride (Amaryl)  Hydrochlorothiazide (Prinzide)  Hydrochlorothiazide (Zestoretic)  Lisinopril (Prinzide)  Lisinopril (Zestoretic)  Memantine (Namenda)  Metformin (Glucophage)  Metoprolol (Lopressor)  Metoprolol (Toprol)  Omeprazole (Prilosec)  Prednisone (Sterapred)  Propranolol (Inderal)  Quetiapine (Seroquel)  Simvastatin (Zocor)  Tamsulosin (Flomax) ==================================================================== For clinical consultation, please call [phone]. ====================================================================     Outpatient Medications Prior to Visit  Medication Sig Dispense Refill  . acetaminophen (TYLENOL) 500 MG tablet Take 1,000 mg by mouth as needed.     Marland Kitchen aspirin EC 81 MG tablet Take 81 mg by mouth every morning.     . budesonide (ENTOCORT EC) 3 MG 24 hr capsule Take 3 mg by mouth daily.     Marland Kitchen buPROPion (WELLBUTRIN SR) 150 MG 12 hr tablet TAKE ONE TABLET BY MOUTH TWICE A DAY 180 tablet 0  . diphenoxylate-atropine (LOMOTIL) 2.5-0.025 MG tablet TAKE ONE TABLET BY MOUTH FOUR TIMES A  DAY AS NEEDED FOR DIARRHEA    . divalproex (DEPAKOTE ER) 250 MG 24 hr tablet Take 250 mg by mouth 2 (two) times daily. Pt takes 250mg  in the morning, and 250mg  in the afternoon    . donepezil (ARICEPT) 10 MG tablet Take 10 mg by mouth at bedtime.    . fenofibrate (TRICOR) 145 MG tablet TAKE ONE TABLET BY MOUTH DAILY 90 tablet 0  . finasteride (PROSCAR) 5 MG tablet Take 1 tablet (5 mg total) by mouth daily. 30 tablet 11  . gabapentin (NEURONTIN) 600 MG tablet TAKE TWO TABLETS BY MOUTH THREE TIMES A DAY 180 tablet 2  . glimepiride (AMARYL) 4 MG tablet Take 1 tablet (4 mg total) by mouth 2 (two) times daily. 180 tablet 3  . lisinopril-hydrochlorothiazide (PRINZIDE,ZESTORETIC) 20-25 MG tablet Take 1 tablet by mouth 2 (two) times daily. 180 tablet 2  . MELATONIN PO Take 20 mg by mouth at bedtime.    . memantine (NAMENDA) 10 MG tablet Take 10 mg by mouth 2 (two) times daily.     . metFORMIN (GLUCOPHAGE) 500 MG tablet TAKE TWO TABLETS BY MOUTH TWICE A DAY WITH A MEAL 360 tablet 1  . metoprolol succinate (TOPROL-XL) 25 MG 24 hr tablet Take 25 mg by mouth daily.     Marland Kitchen omeprazole (PRILOSEC) 20 MG capsule 20 mg 2 (two) times daily before a meal.     . primidone (MYSOLINE) 50 MG tablet TAKE 5 TABLETS IN THE MORNING AND 3 TABLETS IN THE EVENING    . propranolol (INDERAL) 60 MG tablet Take 60 mg by mouth 2 (two) times daily.     . QUEtiapine (SEROQUEL) 25 MG tablet TAKE THREE TO FOUR TABLETS BY MOUTH AT NIGHT    . simvastatin (ZOCOR) 40 MG tablet TAKE 1 TABLET (40 MG TOTAL) BY MOUTH DAILY. 90 tablet 0  . sitaGLIPtin (JANUVIA) 100 MG tablet Take 1 tablet (100 mg total) by mouth daily. 90 tablet 3  . tamsulosin (FLOMAX) 0.4 MG CAPS capsule Take 1 capsule (0.4 mg total) by  mouth daily after supper. 90 capsule 3  . oxyCODONE-acetaminophen (PERCOCET) 7.5-325 MG tablet Take 1 tablet by mouth 3 (three) times daily. 100 tablet 0  . colestipol (COLESTID) 1 g tablet Take 2 g by mouth daily.      No  facility-administered medications prior to visit.    Lab Results  Component Value Date   WBC 6.4 08/10/2016   HGB 10.8 (L) 08/10/2016   HCT 31.2 (L) 08/10/2016   PLT 219 08/10/2016   GLUCOSE 105 (H) 10/24/2016   CHOL 161 09/16/2015   TRIG 393.0 (H) 09/16/2015   HDL 38.50 (L) 09/16/2015   LDLDIRECT 66.0 09/16/2015   ALT 19 10/24/2016   AST 27 10/24/2016   NA 139 10/24/2016   K 3.4 (L) 10/24/2016   CL 99 10/24/2016   CREATININE 1.06 10/24/2016   BUN 22 10/24/2016   CO2 32 10/24/2016   TSH 0.490 08/09/2016   PSA 0.07 (L) 09/16/2015   INR 1.08 11/05/2014   HGBA1C 6.3 10/24/2016    --------------------------------------------------------------------------------------------------------------------- Mr Lumbar Spine Wo Contrast  Result Date: 03/29/2016 CLINICAL DATA:  Initial evaluation for chronic low back pain with left-sided sciatica, worsened. EXAM: MRI LUMBAR SPINE WITHOUT CONTRAST TECHNIQUE: Multiplanar, multisequence MR imaging of the lumbar spine was performed. No intravenous contrast was administered. COMPARISON:  Prior MRI from 09/22/2015. FINDINGS: Segmentation: L normal segmentation. Lowest well-formed disc is labeled the L5-S1 level. Same numbering system is employed as on previous exams. Alignment: Straightening with slight reversal of the normal lumbar lordosis, stable. No listhesis. Vertebrae: Vertebral body heights are maintained. No evidence for acute or chronic fracture. Reactive endplate changes about the left aspect of the L5-S1 interspace, similar to previous. Postoperative changes from recent decompressive left hemi laminectomy with micro discectomy seen at L5-S1. No concerning features identified. Conus medullaris: Extends to the L1 level and appears normal. Paraspinal and other soft tissues: Normal expected postoperative no concerning features identified. Paraspinous soft tissues otherwise within normal limits. Visualized visceral structures are normal. No  retroperitoneal adenopathy. Changes related to decompressive laminectomy present within the posterior paraspinous soft tissues of the lower back. Disc levels: L1-2: Degenerative disc desiccation with intervertebral disc space narrowing and mild disc bulge. No stenosis. L2-3: Degenerative disc desiccation with intervertebral disc space narrowing and mild disc bulge. No stenosis. L3-4: Degenerative disc desiccation with mild diffuse disc bulge. Mild facet hypertrophy. Mild right foraminal stenosis related to disc bulge and facet disease, stable. No canal or left foraminal narrowing. L4-5: Degenerative disc bulge with intervertebral disc space narrowing and disc desiccation. Disc bulging a centric to the right without focal disc protrusion. Superimposed bilateral facet arthrosis with ligamentum flavum hypertrophy. Reactive effusions within the bilateral L4-5 facets, stable. Minimal bilateral subarticular stenosis, slightly greater on the left. Mild bilateral foraminal narrowing related to disc bulge and facet disease, stable. L5-S1: Postoperative changes from interval decompressive left hemi laminectomy with micro discectomy. Previously seen left subarticular disc protrusion has been largely resected, although there appears to be a small residual central disc protrusion that minimally indents the ventral thecal sac (series 6, image 28). Soft tissue density within the left lateral epidural space likely reflects postoperative granulation tissue, although evaluation somewhat limited due to lack of IV contrast. This surrounds the transiting S1 nerve root in the left lateral recess (series 7, image 28). No significant canal stenosis. Superimposed bilateral facet arthrosis with reactive effusions in the bilateral L5-S1 facets is similar to previous. Mild left foraminal narrowing is relatively unchanged. No significant right foraminal stenosis. IMPRESSION:  1. Postoperative changes from recent decompressive left hemi  laminectomy with microdiskectomy at L5-S1. Postoperative granulation tissue within the left epidural space, surrounding the descending left S1 nerve root. No residual stenosis or concerning features identified. 2. 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 No other significant stenosis or evidence for impingement. Electronically Signed   By: Jeannine Boga M.D.   On: 03/29/2016 13:10       ---------------------------------------------------------------------------------------------------------------------- Past Medical History:  Diagnosis Date  . Anxiety   . Anxiety and depression   . Arthritis   . Colon polyps   . Dementia   . Depression   . Diabetes mellitus type 2 in nonobese (HCC)   . GERD (gastroesophageal reflux disease)   . Headache   . 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[place])   . History of hiatal hernia   . Hypercholesteremia   . Hypertension   . Hyperthyroidism   . Neuropathic pain   . Primary localized osteoarthritis of right knee   . Stomach ulcer   . Tremor, essential     Past Surgical History:  Procedure Laterality Date  . COLONOSCOPY WITH PROPOFOL N/A 09/14/2016   Procedure: COLONOSCOPY WITH PROPOFOL;  Surgeon: Manya Silvas, MD;  Location: [facility]COPY;  Service: Endoscopy;  Laterality: N/A;  . esophageal stretch    . JOINT REPLACEMENT    . LUMBAR LAMINECTOMY/DECOMPRESSION MICRODISCECTOMY Left 02/03/2016   Procedure: LEFT L5-S1 DISKECTOMY;  Surgeon: Leeroy Cha, MD;  Location: [place];  Service: Neurosurgery;  Laterality: Left;  LEFT L5-S1 DISKECTOMY  . TONSILLECTOMY    . TOTAL KNEE ARTHROPLASTY Right 11/15/2014   Procedure: TOTAL KNEE ARTHROPLASTY;  Surgeon: Elsie Saas, MD;  Location: [place];  Service: Orthopedics;  Laterality: Right;    Family History  Problem Relation Age of Onset  . Heart attack Mother   . Heart disease Mother   . Hypertension Father   . Depression Father   . Kidney  cancer Neg Hx   . Prostate cancer Neg Hx     Social History  Substance Use Topics  . Smoking status: Former Smoker    Packs/day: 1.00    Years: 60.00    Types: Cigarettes    Quit date: 08/2016  . Smokeless tobacco: Never Used  . 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 Marital status: Married    Spouse name: N/A  . Number of children: N/A  . Years of education: N/A   Social History Main Topics  . Smoking status: Former Smoker    Packs/day: 1.00    Years: 60.00    Types: Cigarettes    Quit date: 08/2016  . Smokeless tobacco: Never Used  . 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 Drug use: No  . Sexual activity: Not Currently   Other Topics Concern  . None   Social History Narrative  . None     BP 106/63   Pulse 65   Temp (!) 96.8 F (36 C)   Resp 16   Ht 5\' 10"  (1.778 m)   Wt 182 lb (82.6 kg)   SpO2 97%   BMI 26.11 kg/m    BP Readings from Last 3 Encounters:  12/12/16 106/63  11/14/16 124/62  10/24/16 130/70     Wt Readings from Last 3 Encounters:  12/12/16 182 lb (82.6 kg)  11/14/16 181 lb 12.8 oz (82.5 kg)  10/24/16 179 lb 9.6 oz (81.5 kg)     ----------------------------------------------------------------------------------------------------------------------  ROS Review of Systems  Cardiac: No angina or palpitations GI: No constipation or hematochezia Objective:  BP 106/63   Pulse 65   Temp (!) 96.8 F (36 C)   Resp 16   Ht 5\' 10"  (1.778 m)   Wt 182 lb (82.6 kg)   SpO2 97%   BMI 26.11 kg/m   Physical Exam Heart is regular rate and rhythm without murmur Lungs are clear to auscultation He has some paraspinous muscle tenderness in the left side. His strength appears to be at baseline and his muscle tone and bulk is at  baseline.  Assessment & Plan:   James Hood was seen today for hip pain.  Diagnoses and all orders for this visit:  Facet arthritis of lumbar region [facility])  Sciatica of left side associated with disorder of lumbar spine -     Lumbar Epidural Injection; Future  Chronic left-sided low back pain with left-sided sciatica -     Lumbar Epidural Injection; Future -     triamcinolone acetonide (KENALOG-40) injection 40 mg; 1 mL (40 mg total) by Other route once. -     sodium chloride flush (NS) 0.9 % injection 10 mL; 10 mLs by Other route once. -     ropivacaine (PF) 2 mg/mL (0.2%) (NAROPIN) injection 10 mL; 10 mLs by Epidural route once. -     midazolam (VERSED) injection 5 mg; Inject 5 mLs (5 mg total) into the vein once. -     lidocaine (PF) (XYLOCAINE) 1 % injection 5 mL; Inject 5 mLs into the skin once. -     lactated ringers infusion 1,000 mL; Inject 1,000 mLs into the vein continuous. -     iopamidol (ISOVUE-M) 41 % intrathecal injection 20 mL; 20 mLs by Other route once as needed for contrast.  Displacement of lumbar intervertebral disc without myelopathy -     Lumbar Epidural Injection  Other orders -     Discontinue: oxyCODONE-acetaminophen (PERCOCET) 7.5-325 MG tablet; Take 1 tablet by mouth 3 (three) times daily. -     Discontinue: oxyCODONE-acetaminophen (PERCOCET) 7.5-325 MG tablet; Take 1 tablet by mouth 3 (three) times daily. -     oxyCODONE-acetaminophen (PERCOCET) 7.5-325 MG tablet; Take 1 tablet by mouth 3 (three) times daily.     ----------------------------------------------------------------------------------------------------------------------  Problem List Items Addressed This Visit      Other   Chronic lumbar pain   Relevant Medications   triamcinolone acetonide (KENALOG-40) injection 40 mg (Completed)   sodium chloride flush (NS) 0.9 % injection 10 mL (Completed)   ropivacaine (PF) 2 mg/mL (0.2%) (NAROPIN) injection 10 mL (Completed)   midazolam (VERSED)  injection 5 mg (Completed)   lidocaine (PF) (XYLOCAINE) 1 % injection 5 mL (Completed)   lactated ringers infusion 1,000 mL   iopamidol (ISOVUE-M) 41 % intrathecal injection 20 mL   oxyCODONE-acetaminophen (PERCOCET) 7.5-325 MG tablet   Other Relevant Orders   Lumbar Epidural Injection    Other Visit Diagnoses    Facet arthritis of lumbar region [facility])    -  Primary   Relevant Medications   triamcinolone acetonide (KENALOG-40) injection 40 mg (Completed)   oxyCODONE-acetaminophen (PERCOCET) 7.5-325 MG tablet   Sciatica of left side associated with disorder of lumbar spine       Relevant Medications   midazolam (VERSED) injection 5 mg (Completed)   Other Relevant Orders   Lumbar Epidural Injection  Displacement of lumbar intervertebral disc without myelopathy          ----------------------------------------------------------------------------------------------------------------------  1. Sciatica of left side associated with disorder of lumbar spine In regards to his sciatica with going to proceed with a caudal epidural steroid which has been effective for him in the past. We'll do this today and have him return to clinic in 2 months for repeat evaluation possible repeat caudal epidural at that time. The risks and benefits of an reviewed and full detail. 2. Left side lumbar facet arthropathy. We will proceed with a repeat left lumbar diagnostic facet block today. The risks and benefits of an reviewed in detail. 2. Facet arthritis left side. We'll proceed with a repeat lumbar facet block for therapeutic benefit today. The risks and benefits been once again reviewed X line 3. Chronic left-sided low back pain with left-sided sciatica Continue current medication management with Percocet 7.5 mg tablets to be taken as prescribed. He seems to be doing well with this based on his narcotic assessment today. We will review the [facility] practitioner database information and it is  appropriate.  4. History of alcohol abuse and he continues to abstain. ----------------------------------------------------------------------------------------------------------------------  I am having James Hood maintain his divalproex, donepezil, colestipol, aspirin EC, propranolol, finasteride, acetaminophen, metoprolol succinate, omeprazole, memantine, primidone, lisinopril-hydrochlorothiazide, glimepiride, QUEtiapine, simvastatin, sitaGLIPtin, metFORMIN, MELATONIN PO, diphenoxylate-atropine, budesonide, tamsulosin, fenofibrate, buPROPion, gabapentin, and oxyCODONE-acetaminophen. We administered triamcinolone acetonide, sodium chloride flush, ropivacaine (PF) 2 mg/mL (0.2%), midazolam, lidocaine (PF), lactated ringers, and iopamidol.   Meds ordered this encounter  Medications  . DISCONTD: oxyCODONE-acetaminophen (PERCOCET) 7.5-325 MG tablet    Sig: Take 1 tablet by mouth 3 (three) times daily.    Dispense:  100 tablet    Refill:  0    Dont fill until [id number]  . DISCONTD: oxyCODONE-acetaminophen (PERCOCET) 7.5-325 MG tablet    Sig: Take 1 tablet by mouth 3 (three) times daily.    Dispense:  100 tablet    Refill:  0    Dont fill until [id number]  . triamcinolone acetonide (KENALOG-40) injection 40 mg  . sodium chloride flush (NS) 0.9 % injection 10 mL  . ropivacaine (PF) 2 mg/mL (0.2%) (NAROPIN) injection 10 mL  . midazolam (VERSED) injection 5 mg  . lidocaine (PF) (XYLOCAINE) 1 % injection 5 mL  . lactated ringers infusion 1,000 mL  . iopamidol (ISOVUE-M) 41 % intrathecal injection 20 mL  . oxyCODONE-acetaminophen (PERCOCET) 7.5-325 MG tablet    Sig: Take 1 tablet by mouth 3 (three) times daily.    Dispense:  100 tablet    Refill:  0    Dont fill until [id number]     Procedure: #3 Left lumbar facet at L3-S1 medial branch block under fluoroscopic guidance with moderate sedation   Patient was taken to the fluoroscopy suite and placed in prone position.  Versed 4 mg was titrated  for moderate sedation. Vital signs were stable throughout the procedure. The  overlying area of skin on the  left side was prepped with Betadine 3 and strict aseptic technique was utilized throughout the procedure. Flouroscopy was used to I identify the areas overlying the aforementioned facets at L3-4  L4-5 and  L5-S1. 1% lidocaine 1 cc was infiltrated subcutaneously and into the fascia with a 25-gauge needle at each of these sites. I then advanced a 22-gauge 3-1/2 inch Quinckie needle with the needle tip to lie at the "[facility]" portion of the [employer] dog". There was negative aspiration for heme or CSF  and  no paresthesia. I then injected 2 cc of ropivacaine 0.2% mixed with10 mg of triamcinolone at each of the aforementioned sites. These needles were withdrawn and the L5-S1 needle was redirected towards the S1 posterior foramen. This was done without  paresthesia, there was negative aspiration and 2 cc of this same mixture was injected at this site. The patient was convalesced discharged home stable condition for follow-up as mentioned.  Caudal epidural steroid No. 1  After informed consent was obtained and the risks benefits reviewed patient chose to pursue a caudal epidural steroid injection today. With the patient in the prone position and an IV in place, sedation was with IV versed. Using AP and lateral fluoroscopic guidance I identified the area overlying the sacral hiatus. This area was broadly prepped with DuraPrep 3 and we utilized strict aseptic technique during the procedure. 1% lidocaine was infiltrated 2 cc using a 25-gauge needle overlying the sacral cornu and then using lateral fluoroscopic guidance I advanced an 18-gauge Touhy needle approximately 2 cm through the sacral hiatus. Confirmation was with 2 cc of Isovue yielding good epidural spread and no evidence of IV or subarachnoid uptake. This was followed by an injection of 5 cc of saline mixed with 1 cc of ropivacaine 0.2% and 40 mg  of triamcinolone. This was tolerated without difficulty the patient was convalesced and discharged home in stable condition for follow-up as mentioned. JA   Follow-up: Return for evaluation, procedure.    Molli Barrows, MD @DATE @  The [place] practitioner database for opioid medications on this patient has been reviewed by me and my staff   Greater than 50% of the total encounter time was spent in counseling and / or coordination of care.     This dictation was performed utilizing Systems analyst.  Please excuse any unintentional or mistaken typographical errors as a result.

## 2016-12-12 NOTE — Progress Notes (Signed)
Safety precautions to be maintained throughout the outpatient stay will include: orient to surroundings, keep bed in low position, maintain call bell within reach at all times, provide assistance with transfer out of bed and ambulation.  

## 2016-12-12 NOTE — Patient Instructions (Signed)
Pain Management Discharge Instructions  General Discharge Instructions :  If you need to reach your doctor call: Monday-Friday 8:00 am - 4:00 pm at [phone] or toll free [phone].  After clinic hours [phone] to have operator reach doctor.  Bring all of your medication bottles to all your appointments in the pain clinic.  To cancel or reschedule your appointment with Pain Management please remember to call 24 hours in advance to avoid a fee.  Refer to the educational materials which you have been given on: General Risks, I had my Procedure. Discharge Instructions, Post Sedation.  Post Procedure Instructions:  The drugs you were given will stay in your system until tomorrow, so for the next 24 hours you should not drive, make any legal decisions or drink any alcoholic beverages.  You may eat anything you prefer, but it is better to start with liquids then soups and crackers, and gradually work up to solid foods.  Please notify your doctor immediately if you have any unusual bleeding, trouble breathing or pain that is not related to your normal pain.  Depending on the type of procedure that was done, some parts of your body may feel week and/or numb.  This usually clears up by tonight or the next day.  Walk with the use of an assistive device or accompanied by an adult for the 24 hours.  You may use ice on the affected area for the first 24 hours.  Put ice in a Ziploc bag and cover with a towel and place against area 15 minutes on 15 minutes off.  You may switch to heat after 24 hours.Facet Joint Block The facet joints connect the bones of the spine (vertebrae). They make it possible for you to bend, twist, and make other movements with your spine. They also keep you from bending too far, twisting too far, and making other excessive movements. A facet joint block is a procedure where a numbing medicine (anesthetic) is injected into a facet joint. Often, a type of  anti-inflammatory medicine called a steroid is also injected. A facet joint block may be done to diagnose neck or back pain. If the pain gets better after a facet joint block, it means the pain is probably coming from the facet joint. If the pain does not get better, it means the pain is probably not coming from the facet joint. A facet joint block may also be done to relieve neck or back pain caused by an inflamed facet joint. A facet joint block is only done to relieve pain if the pain does not improve with other methods, such as medicine, exercise programs, and physical therapy. Tell a health care provider about:  Any allergies you have.  All medicines you are taking, including vitamins, herbs, eye drops, creams, and over-the-counter medicines.  Any problems you or family members have had with anesthetic medicines.  Any blood disorders you have.  Any surgeries you have had.  Any medical conditions you have.  Whether you are pregnant or may be pregnant. What are the risks? Generally, this is a safe procedure. However, problems may occur, including:  Bleeding.  Injury to a nerve near the injection site.  Pain at the injection site.  Weakness or numbness in areas controlled by nerves near the injection site.  Infection.  Temporary fluid retention.  Allergic reactions to medicines or dyes.  Injury to other structures or organs near the injection site.  What happens before the procedure?  Follow instructions from your health   care provider about eating or drinking restrictions.  Ask your health care provider about: ? Changing or stopping your regular medicines. This is especially important if you are taking diabetes medicines or blood thinners. ? Taking medicines such as aspirin and ibuprofen. These medicines can thin your blood. Do not take these medicines before your procedure if your health care provider instructs you not to.  Do not take any new dietary supplements or  medicines without asking your health care provider first.  Plan to have someone take you home after the procedure. What happens during the procedure?  You may need to remove your clothing and dress in an open-back gown.  The procedure will be done while you are lying on an X-ray table. You will most likely be asked to lie on your stomach, but you may be asked to lie in a different position if an injection will be made in your neck.  Machines will be used to monitor your oxygen levels, heart rate, and blood pressure.  If an injection will be made in your neck, an IV tube will be inserted into one of your veins. Fluids and medicine will flow directly into your body through the IV tube.  The area over the facet joint where the injection will be made will be cleaned with soap. The surrounding skin will be covered with clean drapes.  A numbing medicine (local anesthetic) will be applied to your skin. Your skin may sting or burn for a moment.  A video X-ray machine (fluoroscopy) will be used to locate the joint. In some cases, a CT scan may be used.  A contrast dye may be injected into the facet joint area to help locate the joint.  When the joint is located, an anesthetic will be injected into the joint through the needle.  Your health care provider will ask you whether you feel pain relief. If you do feel relief, a steroid may be injected to provide pain relief for a longer period of time. If you do not feel relief or feel only partial relief, additional injections of an anesthetic may be made in other facet joints.  The needle will be removed.  Your skin will be cleaned.  A bandage (dressing) will be applied over each injection site. The procedure may vary among health care providers and hospitals. What happens after the procedure?  You will be observed for 15-30 minutes before being allowed to go home. This information is not intended to replace advice given to you by your health care  provider. Make sure you discuss any questions you have with your health care provider. Document Released: 09/26/2006 Document Revised: 06/08/2015 Document Reviewed: 01/31/2015 Elsevier Interactive Patient Education  2018 [employer] Facet Joint Block, Care After Refer to this sheet in the next few weeks. These instructions provide you with information about caring for yourself after your procedure. Your health care provider may also give you more specific instructions. Your treatment has been planned according to current medical practices, but problems sometimes occur. Call your health care provider if you have any problems or questions after your procedure. What can I expect after the procedure? After the procedure, it is common to have:  Some tenderness over the injection sites for 2 days after the procedure.  A temporary increase in blood sugar if you have diabetes.  Follow these instructions at home:  Keep track of the amount of pain relief you feel and how long it lasts.  Take over-the-counter and prescription medicines  only as told by your health care provider. You may need to limit pain medicine within the first 4-6 hours after the procedure.  Remove your bandages (dressings) the morning after the procedure.  For the first 24 hours after the procedure: ? Do not apply heat near or over the injection sites. ? Do not take a bath or soak in water, such as in a pool or lake. ? Do not drive or operate heavy machinery unless approved by your health care provider. ? Avoid activities that require a lot of energy.  If the injection site is tender, try applying ice to the area. To do this: ? Put ice in a plastic bag. ? Place a towel between your skin and the bag. ? Leave the ice on for 20 minutes, 2-3 times a day.  Keep all follow-up visits as told by your health care provider. This is important. Contact a health care provider if:  Fluid is coming from an injection site.  There is  significant bleeding or swelling at an injection site.  You have diabetes and your blood sugar is above 180 mg/dL. Get help right away if:  You have a fever.  You have worsening pain or swelling around an injection site.  There are red streaks around an injection site.  You develop severe pain that is not controlled by your medicines.  You develop a headache, stiff neck, nausea, or vomiting.  Your eyes become very sensitive to light.  You have weakness, paralysis, or tingling in your arms or legs that was not present before the procedure.  You have difficulty urinating or breathing. This information is not intended to replace advice given to you by your health care provider. Make sure you discuss any questions you have with your health care provider. Document Released: 04/23/2012 Document Revised: 09/21/2015 Document Reviewed: 01/31/2015 Elsevier Interactive Patient Education  2018 [employer]  What are the risk, side effects and possible complications? Generally speaking, most procedures are safe.  However, with any procedure there are risks, side effects, and the possibility of complications.  The risks and complications are dependent upon the sites that are lesioned, or the type of nerve block to be performed.  The closer the procedure is to the spine, the more serious the risks are.  Great care is taken when placing the radio frequency needles, block needles or lesioning probes, but sometimes complications can occur. 1. Infection: Any time there is an injection through the skin, there is a risk of infection.  This is why sterile conditions are used for these blocks.  There are four possible types of infection. 1. Localized skin infection. 2. Central Nervous System Infection-This can be in the form of Meningitis, which can be deadly. 3. Epidural Infections-This can be in the form of an epidural abscess, which can cause pressure inside of the spine,  causing compression of the spinal cord with subsequent paralysis. This would require an emergency surgery to decompress, and there are no guarantees that the patient would recover from the paralysis. 4. Discitis-This is an infection of the intervertebral discs.  It occurs in about 1% of discography procedures.  It is difficult to treat and it may lead to surgery.        2. Pain: the needles have to go through skin and soft tissues, will cause soreness.       3. Damage to internal structures:  The nerves to be lesioned may be near blood vessels or  other nerves which can be potentially damaged.       4. Bleeding: Bleeding is more common if the patient is taking blood thinners such as  aspirin, Coumadin, Ticiid, Plavix, etc., or if he/she have some genetic predisposition  such as hemophilia. Bleeding into the spinal canal can cause compression of the spinal  cord with subsequent paralysis.  This would require an emergency surgery to  decompress and there are no guarantees that the patient would recover from the  paralysis.       5. Pneumothorax:  Puncturing of a lung is a possibility, every time a needle is introduced in  the area of the chest or upper back.  Pneumothorax refers to free air around the  collapsed lung(s), inside of the thoracic cavity (chest cavity).  Another two possible  complications related to a similar event would include: Hemothorax and Chylothorax.   These are variations of the Pneumothorax, where instead of air around the collapsed  lung(s), you may have blood or chyle, respectively.       6. Spinal headaches: They may occur with any procedures in the area of the spine.       7. Persistent CSF (Cerebro-Spinal Fluid) leakage: This is a rare problem, but may occur  with prolonged intrathecal or epidural catheters either due to the formation of a fistulous  track or a dural tear.       8. Nerve damage: By working so close to the spinal cord, there is always a possibility of  nerve  damage, which could be as serious as a permanent spinal cord injury with  paralysis.       9. Death:  Although rare, severe deadly allergic reactions known as "Anaphylactic  reaction" can occur to any of the medications used.      10. Worsening of the symptoms:  We can always make thing worse.  What are the chances of something like this happening? Chances of any of this occuring are extremely low.  By statistics, you have more of a chance of getting killed in a motor vehicle accident: while driving to the hospital than any of the above occurring .  Nevertheless, you should be aware that they are possibilities.  In general, it is similar to taking a shower.  Everybody knows that you can slip, hit your head and get killed.  Does that mean that you should not shower again?  Nevertheless always keep in mind that statistics do not mean anything if you happen to be on the wrong side of them.  Even if a procedure has a 1 (one) in a 1,000,000 (million) chance of going wrong, it you happen to be that one..Also, keep in mind that by statistics, you have more of a chance of having something go wrong when taking medications.  Who should not have this procedure? If you are on a blood thinning medication (e.g. Coumadin, Plavix, see list of "Blood Thinners"), or if you have an active infection going on, you should not have the procedure.  If you are taking any blood thinners, please inform your physician.  How should I prepare for this procedure?  Do not eat or drink anything at least six hours prior to the procedure.  Bring a driver with you .  It cannot be a taxi.  Come accompanied by an adult that can drive you back, and that is strong enough to help you if your legs get weak or numb from the local anesthetic.  Take all  of your medicines the morning of the procedure with just enough water to swallow them.  If you have diabetes, make sure that you are scheduled to have your procedure done first thing in the  morning, whenever possible.  If you have diabetes, take only half of your insulin dose and notify our nurse that you have done so as soon as you arrive at the clinic.  If you are diabetic, but only take blood sugar pills (oral hypoglycemic), then do not take them on the morning of your procedure.  You may take them after you have had the procedure.  Do not take aspirin or any aspirin-containing medications, at least eleven (11) days prior to the procedure.  They may prolong bleeding.  Wear loose fitting clothing that may be easy to take off and that you would not mind if it got stained with Betadine or blood.  Do not wear any jewelry or perfume  Remove any nail coloring.  It will interfere with some of our monitoring equipment.  NOTE: Remember that this is not meant to be interpreted as a complete list of all possible complications.  Unforeseen problems may occur.  BLOOD THINNERS The following drugs contain aspirin or other products, which can cause increased bleeding during surgery and should not be taken for 2 weeks prior to and 1 week after surgery.  If you should need take something for relief of minor pain, you may take acetaminophen which is found in Tylenol,m Datril, Anacin-3 and Panadol. It is not blood thinner. The products listed below are.  Do not take any of the products listed below in addition to any listed on your instruction sheet.  A.P.C or A.P.C with Codeine Codeine Phosphate Capsules #3 Ibuprofen Ridaura  ABC compound Congesprin Imuran rimadil  Advil Cope Indocin Robaxisal  Alka-Seltzer Effervescent Pain Reliever and Antacid Coricidin or Coricidin-D  Indomethacin Rufen  Alka-Seltzer plus Cold Medicine Cosprin Ketoprofen S-A-C Tablets  Anacin Analgesic Tablets or Capsules Coumadin Korlgesic Salflex  Anacin Extra Strength Analgesic tablets or capsules CP-2 Tablets Lanoril Salicylate  Anaprox Cuprimine Capsules Levenox Salocol  Anexsia-D Dalteparin Magan Salsalate    Anodynos Darvon compound Magnesium Salicylate Sine-off  Ansaid Dasin Capsules Magsal Sodium Salicylate  Anturane Depen Capsules Marnal Soma  APF Arthritis pain formula Dewitt's Pills Measurin Stanback  Argesic Dia-Gesic Meclofenamic Sulfinpyrazone  Arthritis Bayer Timed Release Aspirin Diclofenac Meclomen Sulindac  Arthritis pain formula Anacin Dicumarol Medipren Supac  Analgesic (Safety coated) Arthralgen Diffunasal Mefanamic Suprofen  Arthritis Strength Bufferin Dihydrocodeine Mepro Compound Suprol  Arthropan liquid Dopirydamole Methcarbomol with Aspirin Synalgos  ASA tablets/Enseals Disalcid Micrainin Tagament  Ascriptin Doan's Midol Talwin  Ascriptin A/D Dolene Mobidin Tanderil  Ascriptin Extra Strength Dolobid Moblgesic Ticlid  Ascriptin with Codeine Doloprin or Doloprin with Codeine Momentum Tolectin  Asperbuf Duoprin Mono-gesic Trendar  Aspergum Duradyne Motrin or Motrin IB Triminicin  Aspirin plain, buffered or enteric coated Durasal Myochrisine Trigesic  Aspirin Suppositories Easprin Nalfon Trillsate  Aspirin with Codeine Ecotrin Regular or Extra Strength Naprosyn Uracel  Atromid-S Efficin Naproxen Ursinus  Auranofin Capsules Elmiron Neocylate Vanquish  Axotal Emagrin Norgesic Verin  Azathioprine Empirin or Empirin with Codeine Normiflo Vitamin E  Azolid Emprazil Nuprin Voltaren  Bayer Aspirin plain, buffered or children's or timed BC Tablets or powders Encaprin Orgaran Warfarin Sodium  Buff-a-Comp Enoxaparin Orudis Zorpin  Buff-a-Comp with Codeine Equegesic Os-Cal-Gesic   Buffaprin Excedrin plain, buffered or Extra Strength Oxalid   Bufferin Arthritis Strength Feldene Oxphenbutazone   Bufferin plain or Extra Strength Feldene Capsules  Oxycodone with Aspirin   Bufferin with Codeine Fenoprofen Fenoprofen Pabalate or Pabalate-SF   Buffets II Flogesic Panagesic   Buffinol plain or Extra Strength Florinal or Florinal with Codeine Panwarfarin   Buf-Tabs Flurbiprofen  Penicillamine   Butalbital Compound Four-way cold tablets Penicillin   Butazolidin Fragmin Pepto-Bismol   Carbenicillin Geminisyn Percodan   Carna Arthritis Reliever Geopen Persantine   Carprofen Gold's salt Persistin   Chloramphenicol Goody's Phenylbutazone   Chloromycetin Haltrain Piroxlcam   Clmetidine heparin Plaquenil   Cllnoril Hyco-pap Ponstel   Clofibrate Hydroxy chloroquine Propoxyphen         Before stopping any of these medications, be sure to consult the physician who ordered them.  Some, such as Coumadin (Warfarin) are ordered to prevent or treat serious conditions such as "deep thrombosis", "pumonary embolisms", and other heart problems.  The amount of time that you may need off of the medication may also vary with the medication and the reason for which you were taking it.  If you are taking any of these medications, please make sure you notify your pain physician before you undergo any procedures.         Epidural Steroid Injection An epidural steroid injection is a shot of steroid medicine and numbing medicine that is given into the space between the spinal cord and the bones in your back (epidural space). The shot helps relieve pain caused by an irritated or swollen nerve root. The amount of pain relief you get from the injection depends on what is causing the nerve to be swollen and irritated, and how long your pain lasts. You are more likely to benefit from this injection if your pain is strong and comes on suddenly rather than if you have had pain for a long time. Tell a health care provider about:  Any allergies you have.  All medicines you are taking, including vitamins, herbs, eye drops, creams, and over-the-counter medicines.  Any problems you or family members have had with anesthetic medicines.  Any blood disorders you have.  Any surgeries you have had.  Any medical conditions you have.  Whether you are pregnant or may be pregnant. What are the  risks? Generally, this is a safe procedure. However, problems may occur, including:  Headache.  Bleeding.  Infection.  Allergic reaction to medicines.  Damage to your nerves.  What happens before the procedure? Staying hydrated Follow instructions from your health care provider about hydration, which may include:  Up to 2 hours before the procedure - you may continue to drink clear liquids, such as water, clear fruit juice, black coffee, and plain tea.  Eating and drinking restrictions Follow instructions from your health care provider about eating and drinking, which may include:  8 hours before the procedure - stop eating heavy meals or foods such as meat, fried foods, or fatty foods.  6 hours before the procedure - stop eating light meals or foods, such as toast or cereal.  6 hours before the procedure - stop drinking milk or drinks that contain milk.  2 hours before the procedure - stop drinking clear liquids.  Medicine  You may be given medicines to lower anxiety.  Ask your health care provider about: ? Changing or stopping your regular medicines. This is especially important if you are taking diabetes medicines or blood thinners. ? Taking medicines such as aspirin and ibuprofen. These medicines can thin your blood. Do not take these medicines before your procedure if your health care provider instructs you not  to. General instructions  Plan to have someone take you home from the hospital or clinic. What happens during the procedure?  You may receive a medicine to help you relax (sedative).  You will be asked to lie on your abdomen.  The injection site will be cleaned.  A numbing medicine (local anesthetic) will be used to numb the injection site.  A needle will be inserted through your skin into the epidural space. You may feel some discomfort when this happens. An X-ray machine will be used to make sure the needle is put as close as possible to the affected  nerve.  A steroid medicine and a local anesthetic will be injected into the epidural space.  The needle will be removed.  A bandage (dressing) will be put over the injection site. What happens after the procedure?  Your blood pressure, heart rate, breathing rate, and blood oxygen level will be monitored until the medicines you were given have worn off.  Your arm or leg may feel weak or numb for a few hours.  The injection site may feel sore.  Do not drive for 24 hours if you received a sedative. This information is not intended to replace advice given to you by your health care provider. Make sure you discuss any questions you have with your health care provider. Document Released: 08/14/2007 Document Revised: 10/19/2015 Document Reviewed: 08/23/2015 Elsevier Interactive Patient Education  2017 [employer].

## 2016-12-13 ENCOUNTER — Telehealth: Payer: Self-pay | Admitting: *Deleted

## 2016-12-13 NOTE — Telephone Encounter (Signed)
No problems post procedure. 

## 2016-12-21 ENCOUNTER — Other Ambulatory Visit: Payer: Self-pay | Admitting: Pharmacist

## 2016-12-21 NOTE — Patient Outreach (Signed)
[facility]) Care Management  12/21/2016  RESHAD SAAB Jan 08, 1941 [id number]  Successful phone outreach to patient.  Patient reports he received [employer] patient assistance supplied Januvia.  He reports prescription label says he has refills until 2019---discussed with patient, [employer] may have approved his application through 21/62/44, refills are typically valid for 1 year from date of prescription.  He was counseled he will likely have to re-apply and meet manufacturer patient assistance program requirements next year.   Patient denies other pharmacy related questions/concerns at this time.  He reports he was giving directions for refilling Januvia via patient assistance program.    Plan:  Pharmacy case closed.    Will update PCP patient was approved for Januvia from [employer] Patient Assistance program.   Karrie Meres, PharmD, [place] [phone]

## 2017-01-02 ENCOUNTER — Ambulatory Visit (INDEPENDENT_AMBULATORY_CARE_PROVIDER_SITE_OTHER): Payer: PPO | Admitting: Family Medicine

## 2017-01-02 ENCOUNTER — Other Ambulatory Visit: Payer: Self-pay | Admitting: Family Medicine

## 2017-01-02 ENCOUNTER — Encounter: Payer: Self-pay | Admitting: Family Medicine

## 2017-01-02 VITALS — BP 120/72 | HR 97 | Temp 97.9°F | Wt 177.4 lb

## 2017-01-02 DIAGNOSIS — R61 Generalized hyperhidrosis: Secondary | ICD-10-CM | POA: Diagnosis not present

## 2017-01-02 DIAGNOSIS — E1142 Type 2 diabetes mellitus with diabetic polyneuropathy: Secondary | ICD-10-CM

## 2017-01-02 LAB — BASIC METABOLIC PANEL
BUN: 31 mg/dL — ABNORMAL HIGH (ref 6–23)
CO2: 34 mEq/L — ABNORMAL HIGH (ref 19–32)
Calcium: 10.3 mg/dL (ref 8.4–10.5)
Chloride: 100 mEq/L (ref 96–112)
Creatinine, Ser: 1.08 mg/dL (ref 0.40–1.50)
GFR: 70.48 mL/min (ref >60.00)
Glucose, Bld: 188 mg/dL — ABNORMAL HIGH (ref 70–99)
Potassium: 4.7 mEq/L (ref 3.5–5.1)
Sodium: 141 mEq/L (ref 135–145)

## 2017-01-02 NOTE — Patient Instructions (Signed)
Nice to see you. I suspect your symptoms are related to your blood sugar being low. You need to make sure you eat 3 good meals a day. We'll discontinue the glimepiride. If you continue to have issues with this please let us know. We will get CT scans scheduled for you given your persistent night sweats.

## 2017-01-02 NOTE — Progress Notes (Signed)
Tommi Rumps, MD Phone: [phone]  James Hood is a 76 y.o. male who presents today for follow-up.  Patient notes for 5 days ago he developed low blood sugar. He noted he started to feel dizzy and sweaty. Felt overall weak. Just didn't feel good. He had not eaten anything yet and it was later in the afternoon. He notes he spoke with his wife who felt he might have low blood sugar and she advised him to check his blood sugar. It was 60. He had a little trouble figuring out how to use the remote though once he got food in him he started to feel better and improved. He has not had any recurrence of the symptoms. He had no numbness or focal weakness. He's had intermittent low sugars since increasing his diabetes medications. He does note chronic night sweats that have been going on for years. Sometimes the pillow case will be wet and sometimes his clothes will be wet as well. He has had some weight loss though he's had chronic diarrhea and has been following with GI for that. 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 He has gone back to smoking. He had prior lab work for the night sweats that was reassuring. He wonders if the night sweats could be due to low blood sugar in the middle the night. He does note he does not have much of an appetite though he notes no early satiety. No itching.  PMH: Smoker   ROS see history of present illness  Objective  Physical Exam Vitals:   01/02/17 1437  BP: 120/72  Pulse: 97  Temp: 97.9 F (36.6 C)  SpO2: 95%    BP Readings from Last 3 Encounters:  01/02/17 120/72  12/12/16 106/63  11/14/16 124/62   Wt Readings from Last 3 Encounters:  01/02/17 177 lb 6.4 oz (80.5 kg)  12/12/16 182 lb (82.6 kg)  11/14/16 181 lb 12.8 oz (82.5 kg)    Physical Exam  Constitutional: No distress.  HENT:  Head: Normocephalic and atraumatic.  Mouth/Throat: Oropharynx is clear and moist. No oropharyngeal exudate.   Eyes: Pupils are equal, round, and reactive to light. Conjunctivae are normal.  Neck: Neck supple.  Cardiovascular: Normal rate, regular rhythm and normal heart sounds.   Pulmonary/Chest: Effort normal and breath sounds normal.  Abdominal: Soft. Bowel sounds are normal. He exhibits no distension. There is no tenderness. There is no rebound and no guarding.  Musculoskeletal: He exhibits no edema.  Lymphadenopathy:       Head (right side): No submental and no submandibular adenopathy present.       Head (left side): No submental and no submandibular adenopathy present.    He has no cervical adenopathy.       Right: No supraclavicular adenopathy present.       Left: No supraclavicular adenopathy present.  Neurological: He is alert. Gait normal.  Skin: Skin is warm and dry. He is not diaphoretic.     Assessment/Plan: Please see individual problem list.  Type 2 diabetes mellitus ([place]) Patient with several episodes of hypoglycemia with his most recent episode being the most symptomatic. His symptoms do fit with hypoglycemia and the fact that his sugar was low makes this the most likely cause. He is neurologically intact. We discussed discontinuing Januvia given that he recently started this and his wife felt as though his low sugar only started after he was on the Januvia though the patient was hesitant  to do this. We discussed discontinuing the glimepiride and he was okay with this. We'll check a BMP today. If he has recurrent issues with low blood sugar he will let us know. If he has recurrent symptoms and his sugars not low he will let us know. If he develops any new symptoms he'll be reevaluated. Given return precautions.  Night sweats Patient today brings up that he has continued to have some night sweats. He has not mentioned this at recent visits. Prior lab work was reassuring. Given this in combination with his weight loss we will check a CT chest and abdomen and pelvis to evaluate  further. I do suspect the weight loss is likely related to some of his intermittent diarrhea. He has follow-up with GI for that.   Orders Placed This Encounter  Procedures  . CT Chest Wo Contrast    Standing Status:   Future    Standing Expiration Date:   03/04/2018    Order Specific Question:   Preferred imaging location?    Answer:   [facility]    Order Specific Question:   Radiology Contrast Protocol - do NOT remove file path    Answer:   \\[email or web address]  . CT Abdomen Pelvis W Contrast    Standing Status:   Future    Standing Expiration Date:   04/04/2018    Order Specific Question:   If indicated for the ordered procedure, I authorize the administration of contrast media per Radiology protocol    Answer:   Yes    Order Specific Question:   Preferred imaging location?    Answer:   [facility]    Order Specific Question:   Radiology Contrast Protocol - do NOT remove file path    Answer:   \\[email or web address]  . Basic Metabolic Panel (BMET)    Tommi Rumps, MD [place]

## 2017-01-02 NOTE — Assessment & Plan Note (Signed)
Patient today brings up that he has continued to have some night sweats. He has not mentioned this at recent visits. Prior lab work was reassuring. Given this in combination with his weight loss we will check a CT chest and abdomen and pelvis to evaluate further. I do suspect the weight loss is likely related to some of his intermittent diarrhea. He has follow-up with GI for that.

## 2017-01-02 NOTE — Assessment & Plan Note (Signed)
Patient with several episodes of hypoglycemia with his most recent episode being the most symptomatic. His symptoms do fit with hypoglycemia and the fact that his sugar was low makes this the most likely cause. He is neurologically intact. We discussed discontinuing Januvia given that he recently started this and his wife felt as though his low sugar only started after he was on the Januvia though the patient was hesitant to do this. We discussed discontinuing the glimepiride and he was okay with this. We'll check a BMP today. If he has recurrent issues with low blood sugar he will let us know. If he has recurrent symptoms and his sugars not low he will let us know. If he develops any new symptoms he'll be reevaluated. Given return precautions.

## 2017-01-11 ENCOUNTER — Ambulatory Visit
Admission: RE | Admit: 2017-01-11 | Discharge: 2017-01-11 | Disposition: A | Discharge status: Home / Self Care | Payer: PPO | Source: Ambulatory Visit | Attending: Family Medicine | Admitting: Family Medicine

## 2017-01-11 DIAGNOSIS — E041 Nontoxic single thyroid nodule: Secondary | ICD-10-CM | POA: Insufficient documentation

## 2017-01-11 DIAGNOSIS — R933 Abnormal findings on diagnostic imaging of other parts of digestive tract: Secondary | ICD-10-CM | POA: Insufficient documentation

## 2017-01-11 DIAGNOSIS — R61 Generalized hyperhidrosis: Secondary | ICD-10-CM

## 2017-01-11 DIAGNOSIS — K5730 Diverticulosis of large intestine without perforation or abscess without bleeding: Secondary | ICD-10-CM | POA: Insufficient documentation

## 2017-01-11 DIAGNOSIS — I700 Atherosclerosis of aorta: Secondary | ICD-10-CM | POA: Diagnosis not present

## 2017-01-11 DIAGNOSIS — R911 Solitary pulmonary nodule: Secondary | ICD-10-CM | POA: Diagnosis not present

## 2017-01-11 DIAGNOSIS — R197 Diarrhea, unspecified: Secondary | ICD-10-CM | POA: Diagnosis not present

## 2017-01-11 MED ORDER — IOPAMIDOL (ISOVUE-300) INJECTION 61%
100.0000 mL | Freq: Once | INTRAVENOUS | Status: AC | PRN
  Administered 2017-01-11: 100 mL via INTRAVENOUS

## 2017-01-14 ENCOUNTER — Other Ambulatory Visit: Payer: Self-pay | Admitting: Family Medicine

## 2017-01-14 ENCOUNTER — Telehealth: Payer: Self-pay | Admitting: Family Medicine

## 2017-01-14 DIAGNOSIS — K2289 Other specified disease of esophagus: Secondary | ICD-10-CM

## 2017-01-14 DIAGNOSIS — K228 Other specified diseases of esophagus: Secondary | ICD-10-CM

## 2017-01-14 DIAGNOSIS — E041 Nontoxic single thyroid nodule: Secondary | ICD-10-CM

## 2017-01-14 NOTE — Telephone Encounter (Signed)
Please advise 

## 2017-01-14 NOTE — Telephone Encounter (Signed)
Please let the patient know that we typically do not prophylax with antibiotics for routine dental work in patients with joint replacements. I have a form from his dentist that I will send to the dentists office today. Thanks.

## 2017-01-14 NOTE — Telephone Encounter (Signed)
Patient notified

## 2017-01-14 NOTE — Telephone Encounter (Signed)
Patient called and left a message stating that he needs a prescription for amoxicillin to take before some dental work. 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 Please advise, thank you!  Call pt @ [phone]

## 2017-01-18 ENCOUNTER — Ambulatory Visit: Payer: PPO

## 2017-01-22 ENCOUNTER — Ambulatory Visit
Admission: RE | Admit: 2017-01-22 | Discharge: 2017-01-22 | Disposition: A | Discharge status: Home / Self Care | Payer: PPO | Source: Ambulatory Visit | Attending: Family Medicine | Admitting: Family Medicine

## 2017-01-22 ENCOUNTER — Ambulatory Visit: Payer: PPO | Attending: Anesthesiology | Admitting: Anesthesiology

## 2017-01-22 ENCOUNTER — Encounter: Payer: Self-pay | Admitting: Anesthesiology

## 2017-01-22 ENCOUNTER — Ambulatory Visit
Admission: RE | Admit: 2017-01-22 | Discharge: 2017-01-22 | Disposition: A | Discharge status: Home / Self Care | Payer: PPO | Source: Ambulatory Visit | Attending: Anesthesiology | Admitting: Anesthesiology

## 2017-01-22 ENCOUNTER — Other Ambulatory Visit: Payer: Self-pay | Admitting: Anesthesiology

## 2017-01-22 VITALS — BP 128/77 | HR 71 | Temp 98.1°F | Resp 16 | Ht 68.0 in | Wt 175.0 lb

## 2017-01-22 DIAGNOSIS — Z7982 Long term (current) use of aspirin: Secondary | ICD-10-CM | POA: Insufficient documentation

## 2017-01-22 DIAGNOSIS — R52 Pain, unspecified: Secondary | ICD-10-CM

## 2017-01-22 DIAGNOSIS — K219 Gastro-esophageal reflux disease without esophagitis: Secondary | ICD-10-CM | POA: Insufficient documentation

## 2017-01-22 DIAGNOSIS — E7800 Pure hypercholesterolemia, unspecified: Secondary | ICD-10-CM | POA: Insufficient documentation

## 2017-01-22 DIAGNOSIS — G250 Essential tremor: Secondary | ICD-10-CM | POA: Insufficient documentation

## 2017-01-22 DIAGNOSIS — F329 Major depressive disorder, single episode, unspecified: Secondary | ICD-10-CM | POA: Diagnosis not present

## 2017-01-22 DIAGNOSIS — F419 Anxiety disorder, unspecified: Secondary | ICD-10-CM | POA: Insufficient documentation

## 2017-01-22 DIAGNOSIS — Z7984 Long term (current) use of oral hypoglycemic drugs: Secondary | ICD-10-CM | POA: Diagnosis not present

## 2017-01-22 DIAGNOSIS — M79605 Pain in left leg: Secondary | ICD-10-CM | POA: Insufficient documentation

## 2017-01-22 DIAGNOSIS — M5442 Lumbago with sciatica, left side: Secondary | ICD-10-CM

## 2017-01-22 DIAGNOSIS — M5126 Other intervertebral disc displacement, lumbar region: Secondary | ICD-10-CM

## 2017-01-22 DIAGNOSIS — G8929 Other chronic pain: Secondary | ICD-10-CM

## 2017-01-22 DIAGNOSIS — Z86010 Personal history of colonic polyps: Secondary | ICD-10-CM | POA: Diagnosis not present

## 2017-01-22 DIAGNOSIS — F1021 Alcohol dependence, in remission: Secondary | ICD-10-CM | POA: Insufficient documentation

## 2017-01-22 DIAGNOSIS — F17210 Nicotine dependence, cigarettes, uncomplicated: Secondary | ICD-10-CM | POA: Diagnosis not present

## 2017-01-22 DIAGNOSIS — R51 Headache: Secondary | ICD-10-CM | POA: Insufficient documentation

## 2017-01-22 DIAGNOSIS — F0390 Unspecified dementia without behavioral disturbance: Secondary | ICD-10-CM | POA: Diagnosis not present

## 2017-01-22 DIAGNOSIS — Z8719 Personal history of other diseases of the digestive system: Secondary | ICD-10-CM | POA: Insufficient documentation

## 2017-01-22 DIAGNOSIS — E0590 Thyrotoxicosis, unspecified without thyrotoxic crisis or storm: Secondary | ICD-10-CM | POA: Insufficient documentation

## 2017-01-22 DIAGNOSIS — E041 Nontoxic single thyroid nodule: Secondary | ICD-10-CM | POA: Insufficient documentation

## 2017-01-22 DIAGNOSIS — M792 Neuralgia and neuritis, unspecified: Secondary | ICD-10-CM | POA: Insufficient documentation

## 2017-01-22 DIAGNOSIS — E1140 Type 2 diabetes mellitus with diabetic neuropathy, unspecified: Secondary | ICD-10-CM | POA: Insufficient documentation

## 2017-01-22 DIAGNOSIS — I10 Essential (primary) hypertension: Secondary | ICD-10-CM | POA: Insufficient documentation

## 2017-01-22 DIAGNOSIS — M539 Dorsopathy, unspecified: Secondary | ICD-10-CM

## 2017-01-22 DIAGNOSIS — M25552 Pain in left hip: Secondary | ICD-10-CM | POA: Insufficient documentation

## 2017-01-22 DIAGNOSIS — K449 Diaphragmatic hernia without obstruction or gangrene: Secondary | ICD-10-CM | POA: Diagnosis not present

## 2017-01-22 DIAGNOSIS — M5386 Other specified dorsopathies, lumbar region: Secondary | ICD-10-CM

## 2017-01-22 MED ORDER — SODIUM CHLORIDE 0.9% FLUSH
10.0000 mL | Freq: Once | INTRAVENOUS | Status: AC
  Administered 2017-01-22: 10 mL

## 2017-01-22 MED ORDER — ROPIVACAINE HCL 2 MG/ML IJ SOLN
10.0000 mL | Freq: Once | INTRAMUSCULAR | Status: AC
  Administered 2017-01-22: 10 mL via EPIDURAL
  Filled 2017-01-22: qty 10

## 2017-01-22 MED ORDER — MIDAZOLAM HCL 5 MG/5ML IJ SOLN: 5.0000 mg | Freq: Once | INTRAMUSCULAR | Status: DC

## 2017-01-22 MED ORDER — OXYCODONE-ACETAMINOPHEN 7.5-325 MG PO TABS: 1.0000 | ORAL_TABLET | Freq: Three times a day (TID) | ORAL | 0 refills | Status: DC

## 2017-01-22 MED ORDER — TRIAMCINOLONE ACETONIDE 40 MG/ML IJ SUSP
40.0000 mg | Freq: Once | INTRAMUSCULAR | Status: AC
  Administered 2017-01-22: 40 mg
  Filled 2017-01-22: qty 1

## 2017-01-22 MED ORDER — LACTATED RINGERS IV SOLN: 1000.0000 mL | INTRAVENOUS | Status: DC

## 2017-01-22 MED ORDER — LIDOCAINE HCL (PF) 1 % IJ SOLN
INTRAMUSCULAR | Status: AC
  Filled 2017-01-22: qty 5

## 2017-01-22 MED ORDER — IOPAMIDOL (ISOVUE-M 200) INJECTION 41%
INTRAMUSCULAR | Status: AC
  Filled 2017-01-22: qty 10

## 2017-01-22 MED ORDER — LIDOCAINE HCL (PF) 1 % IJ SOLN
5.0000 mL | Freq: Once | INTRAMUSCULAR | Status: AC
  Administered 2017-01-22: 5 mL via SUBCUTANEOUS
  Filled 2017-01-22: qty 5

## 2017-01-22 MED ORDER — IOPAMIDOL (ISOVUE-M 200) INJECTION 41%
20.0000 mL | Freq: Once | INTRAMUSCULAR | Status: DC | PRN
  Administered 2017-01-22: 10 mL
  Filled 2017-01-22: qty 20

## 2017-01-22 MED ORDER — SODIUM CHLORIDE 0.9 % IJ SOLN
INTRAMUSCULAR | Status: AC
  Filled 2017-01-22: qty 10

## 2017-01-22 NOTE — Progress Notes (Signed)
Nursing Pain Medication Assessment:  Safety precautions to be maintained throughout the outpatient stay will include: orient to surroundings, keep bed in low position, maintain call bell within reach at all times, provide assistance with transfer out of bed and ambulation.  Medication Inspection Compliance: Mr. Hilbun did not comply with our request to bring his pills to be counted. He was reminded that bringing the medication bottles, even when empty, is a requirement.  Medication: None brought in. Pill/Patch Count: None available to be counted. Bottle Appearance: No container available. Did not bring bottle(s) to appointment. Filled Date: N/A Last Medication intake:  Today

## 2017-01-22 NOTE — Patient Instructions (Signed)
Pain Management Discharge Instructions  General Discharge Instructions :  If you need to reach your doctor call: Monday-Friday 8:00 am - 4:00 pm at [phone] or toll free [phone].  After clinic hours [phone] to have operator reach doctor.  Bring all of your medication bottles to all your appointments in the pain clinic.  To cancel or reschedule your appointment with Pain Management please remember to call 24 hours in advance to avoid a fee.  Refer to the educational materials which you have been given on: General Risks, I had my Procedure. Discharge Instructions, Post Sedation.  Post Procedure Instructions:  The drugs you were given will stay in your system until tomorrow, so for the next 24 hours you should not drive, make any legal decisions or drink any alcoholic beverages.  You may eat anything you prefer, but it is better to start with liquids then soups and crackers, and gradually work up to solid foods.  Please notify your doctor immediately if you have any unusual bleeding, trouble breathing or pain that is not related to your normal pain.  Depending on the type of procedure that was done, some parts of your body may feel week and/or numb.  This usually clears up by tonight or the next day.  Walk with the use of an assistive device or accompanied by an adult for the 24 hours.  You may use ice on the affected area for the first 24 hours.  Put ice in a Ziploc bag and cover with a towel and place against area 15 minutes on 15 minutes off.  You may switch to heat after 24 hours.Epidural Steroid Injection An epidural steroid injection is a shot of steroid medicine and numbing medicine that is given into the space between the spinal cord and the bones in your back (epidural space). The shot helps relieve pain caused by an irritated or swollen nerve root. The amount of pain relief you get from the injection depends on what is causing the nerve to be swollen and irritated,  and how long your pain lasts. You are more likely to benefit from this injection if your pain is strong and comes on suddenly rather than if you have had pain for a long time. Tell a health care provider about:  Any allergies you have.  All medicines you are taking, including vitamins, herbs, eye drops, creams, and over-the-counter medicines.  Any problems you or family members have had with anesthetic medicines.  Any blood disorders you have.  Any surgeries you have had.  Any medical conditions you have.  Whether you are pregnant or may be pregnant. What are the risks? Generally, this is a safe procedure. However, problems may occur, including:  Headache.  Bleeding.  Infection.  Allergic reaction to medicines.  Damage to your nerves.  What happens before the procedure? Staying hydrated Follow instructions from your health care provider about hydration, which may include:  Up to 2 hours before the procedure - you may continue to drink clear liquids, such as water, clear fruit juice, black coffee, and plain tea.  Eating and drinking restrictions Follow instructions from your health care provider about eating and drinking, which may include:  8 hours before the procedure - stop eating heavy meals or foods such as meat, fried foods, or fatty foods.  6 hours before the procedure - stop eating light meals or foods, such as toast or cereal.  6 hours before the procedure - stop drinking milk or drinks that contain milk.    2 hours before the procedure - stop drinking clear liquids. Medicine  You may be given medicines to lower anxiety.  Ask your health care provider about:  Changing or stopping your regular medicines. This is especially important if you are taking diabetes medicines or blood thinners.  Taking medicines such as aspirin and ibuprofen. These medicines can thin your blood. Do not take these medicines before your procedure if your health care provider instructs  you not to. General instructions  Plan to have someone take you home from the hospital or clinic. What happens during the procedure?  You may receive a medicine to help you relax (sedative).  You will be asked to lie on your abdomen.  The injection site will be cleaned.  A numbing medicine (local anesthetic) will be used to numb the injection site.  A needle will be inserted through your skin into the epidural space. You may feel some discomfort when this happens. An X-ray machine will be used to make sure the needle is put as close as possible to the affected nerve.  A steroid medicine and a local anesthetic will be injected into the epidural space.  The needle will be removed.  A bandage (dressing) will be put over the injection site. What happens after the procedure?  Your blood pressure, heart rate, breathing rate, and blood oxygen level will be monitored until the medicines you were given have worn off.  Your arm or leg may feel weak or numb for a few hours.  The injection site may feel sore.  Do not drive for 24 hours if you received a sedative. This information is not intended to replace advice given to you by your health care provider. Make sure you discuss any questions you have with your health care provider. Document Released: 08/14/2007 Document Revised: 10/19/2015 Document Reviewed: 08/23/2015 [employer] Interactive Patient Education  2017 [employer]  

## 2017-01-23 NOTE — Progress Notes (Signed)
Subjective:  Patient ID: James Hood, male    DOB: [birth year]  Age: 76 y.o. MRN: [id number]  CC: Hip Pain (left) and Leg Pain (left)   Service Provided on Last Visit: Procedure, Evaluation (LESI)  Procedure: Caudal epidural steroid under fluoroscopic guidance without sedation  Previous Procedure: Left side L2-3 L3-4 L4-5 and 5 S1 lumbar medial branch block to the facet nerves under fluoroscopic guidance with moderate sedation and caudal epidural steroid No. 1  Previous Procedure: Left lumbar facet block to the medial branch under fluoroscopic as moderate sedation of 2 Previous Procedure: Left lumbar facet block via medial branch under fluoroscopic guidance with moderate sedation Previous Procedure:  None Previous PROCEDURE:Caudal epidural steroid No. 2 under fluoroscopic guidance with moderate sedation  HPI James Hood presents for reevaluation. He was last seen a few months ago at which point he had a left side lumbar facet block and a caudal epidural steroid injection. This combination kept his pain under very good control with a 75% reduction in his low back pain and left leg pain following this. He's had some recurrence of the same quality characteristic distribution of left side lower leg pain and calf pain with some left hip pain. This is as previously documented with no changes in bowel or bladder function or lower extremity strength or function. Following the injection he found that he was sleeping better at night and has tolerated his medications well which continued to give him good lifestyle functional improvement with the medication management. At present he is taking the Percocet 7.5 mg tablets 3 times a day with an occasional fourth tablet on a few days as needed. He denies any significant side effects with the medication. Based on his narcotic assessment sheet he seems to be deriving good lifestyle functional improvement.  By history  He is a pleasant 76 year old white  male with a long-standing history of low back pain for over one year. He reports that he had a laminectomy in October of this year with Dr. Joya Salm. He also states that he has continued to have lancinating left posterior and lateral leg pain described as sharp nagging pulsating and throbbing it's present throughout much of the day. He rates this as a VAS of 7 best a 2 and now a 5. He feels that it is gotten worse. He has some chronic intermittent weakness of the left leg. He had an MRI that was reviewed with him today that is in follow-up to his October surgery. He also reports having had 3 epidural steroids prior to his surgery and 1 L5-S1 epidural after this. The pain is worse with bending climbing motion squatting and better with rest sleep. 5 mg of oxycodone does not give him much relief. We have reviewed his physician database and there are no concerns. He also takes Neurontin 1200 mg 3 times a day.  History Kalvin has a past medical history of Anxiety; Anxiety and depression; Arthritis; Colon polyps; Dementia; Depression; Diabetes mellitus type 2 in nonobese [facility]); GERD (gastroesophageal reflux disease); Headache; 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[place]); History of hiatal hernia; Hypercholesteremia; Hypertension; Hyperthyroidism; Neuropathic pain; Primary localized osteoarthritis of right knee; Stomach ulcer; and Tremor, essential.   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   His family history includes Depression in his father; Heart attack in his mother; Heart disease in his mother; Hypertension in his father.He  reports that he has been smoking Cigarettes.  He has a 60.00 pack-year smoking history. He has never used smokeless tobacco. He reports that he does not drink alcohol or use drugs.  No  results found for this or any previous visit.  [employer] Select 13  Date Value Ref Range Status  05/03/2016 FINAL  Final    Comment:    ==================================================================== TOX[employer] SELECT 13 (MW) ==================================================================== Test                             Result       Flag       Units Drug Present and Declared for Prescription Verification   Oxycodone                      140          EXPECTED   ng/mg creat   Oxymorphone                    127          EXPECTED   ng/mg creat   Noroxycodone                   903          EXPECTED   ng/mg creat   Noroxymorphone                 71           EXPECTED   ng/mg creat    Sources of oxycodone are scheduled prescription medications.    Oxymorphone, noroxycodone, and noroxymorphone are expected    metabolites of oxycodone. Oxymorphone is also available as a    scheduled prescription medication.   Phenobarbital                  PRESENT      EXPECTED    Phenobarbital is an expected metabolite of primidone;    Phenobarbital may also be administered as a prescription drug. Drug Absent but Declared for Prescription Verification   Desmethyldiazepam              Not Detected UNEXPECTED ng/mg creat    Desmethyldiazepam is an expected metabolite of chlordiazepoxide,    clorazepate, halazepam, and prazepam.   Hydrocodone                    Not Detected UNEXPECTED ng/mg creat   Tramadol                       Not Detected UNEXPECTED ==================================================================== Test                      Result    Flag   Units      Ref Range   Creatinine              75               mg/dL      >=20 ==================================================================== Declared Medications:  The flagging and interpretation on this report are based on the  following declared medications.  Unexpected results may arise from  inaccuracies in the declared  medications.  **Note: The testing scope of this panel includes these medications:  Clorazepate (Tranxene)  Hydrocodone (Norco)  Oxycodone  Oxycodone (Percocet)  Primidone (Mysoline)  Tramadol (Ultram)  **Note: The testing scope of this panel does  not include following  reported medications:  Acetaminophen (Norco)  Acetaminophen (Percocet)  Acetaminophen (Tylenol)  Aspirin  Atropine (Lomotil)  Bupropion (Zyban)  Colestipol  Cyclobenzaprine (Flexeril)  Diphenoxylate (Lomotil)  Divalproex (Depakote)  Donepezil (Aricept)  Fenofibrate (Tricor)  Finasteride (Proscar)  Gabapentin (Neurontin)  Glimepiride (Amaryl)  Hydrochlorothiazide (Prinzide)  Hydrochlorothiazide (Zestoretic)  Lisinopril (Prinzide)  Lisinopril (Zestoretic)  Memantine (Namenda)  Metformin (Glucophage)  Metoprolol (Lopressor)  Metoprolol (Toprol)  Omeprazole (Prilosec)  Prednisone (Sterapred)  Propranolol (Inderal)  Quetiapine (Seroquel)  Simvastatin (Zocor)  Tamsulosin (Flomax) ==================================================================== For clinical consultation, please call [phone]. ====================================================================     Outpatient Medications Prior to Visit  Medication Sig Dispense Refill  . acetaminophen (TYLENOL) 500 MG tablet Take 1,000 mg by mouth as needed.     Marland Kitchen aspirin EC 81 MG tablet Take 81 mg by mouth every morning.     . budesonide (ENTOCORT EC) 3 MG 24 hr capsule Take 3 mg by mouth daily.     Marland Kitchen buPROPion (WELLBUTRIN SR) 150 MG 12 hr tablet TAKE ONE TABLET BY MOUTH TWICE A DAY 180 tablet 0  . colestipol (COLESTID) 1 g tablet Take 2 g by mouth daily.     . diphenoxylate-atropine (LOMOTIL) 2.5-0.025 MG tablet TAKE ONE TABLET BY MOUTH FOUR TIMES A DAY AS NEEDED FOR DIARRHEA    . divalproex (DEPAKOTE ER) 250 MG 24 hr tablet Take 250 mg by mouth 2 (two) times daily. Pt takes 250mg  in the morning, and 250mg  in the afternoon    . donepezil  (ARICEPT) 10 MG tablet Take 10 mg by mouth at bedtime.    . fenofibrate (TRICOR) 145 MG tablet TAKE ONE TABLET BY MOUTH DAILY 90 tablet 0  . finasteride (PROSCAR) 5 MG tablet Take 1 tablet (5 mg total) by mouth daily. 30 tablet 11  . gabapentin (NEURONTIN) 600 MG tablet TAKE TWO TABLETS BY MOUTH THREE TIMES A DAY 180 tablet 2  . glimepiride (AMARYL) 4 MG tablet Take 1 tablet (4 mg total) by mouth 2 (two) times daily. 180 tablet 3  . lisinopril-hydrochlorothiazide (PRINZIDE,ZESTORETIC) 20-25 MG tablet Take 1 tablet by mouth 2 (two) times daily. 180 tablet 2  . MELATONIN PO Take 20 mg by mouth at bedtime.    . memantine (NAMENDA) 10 MG tablet Take 10 mg by mouth 2 (two) times daily.     . metFORMIN (GLUCOPHAGE) 500 MG tablet TAKE TWO TABLETS BY MOUTH TWICE A DAY WITH A MEAL 360 tablet 1  . metoprolol succinate (TOPROL-XL) 25 MG 24 hr tablet Take 25 mg by mouth daily.     Marland Kitchen omeprazole (PRILOSEC) 20 MG capsule 20 mg 2 (two) times daily before a meal.     . primidone (MYSOLINE) 50 MG tablet TAKE 5 TABLETS IN THE MORNING AND 3 TABLETS IN THE EVENING    . propranolol (INDERAL) 60 MG tablet Take 60 mg by mouth 2 (two) times daily.     . QUEtiapine (SEROQUEL) 25 MG tablet TAKE THREE TO FOUR TABLETS BY MOUTH AT NIGHT    . simvastatin (ZOCOR) 40 MG tablet TAKE 1 TABLET (40 MG TOTAL) BY MOUTH DAILY. 90 tablet 0  . sitaGLIPtin (JANUVIA) 100 MG tablet Take 1 tablet (100 mg total) by mouth daily. 90 tablet 3  . tamsulosin (FLOMAX) 0.4 MG CAPS capsule Take 1 capsule (0.4 mg total) by mouth daily after supper. 90 capsule 3  . oxyCODONE-acetaminophen (PERCOCET) 7.5-325 MG tablet Take 1 tablet by mouth 3 (three) times daily. 100 tablet 0  No facility-administered medications prior to visit.    Lab Results  Component Value Date   WBC 6.4 08/10/2016   HGB 10.8 (L) 08/10/2016   HCT 31.2 (L) 08/10/2016   PLT 219 08/10/2016   GLUCOSE 188 (H) 01/02/2017   CHOL 161 09/16/2015   TRIG 393.0 (H) 09/16/2015   HDL  38.50 (L) 09/16/2015   LDLDIRECT 66.0 09/16/2015   ALT 19 10/24/2016   AST 27 10/24/2016   NA 141 01/02/2017   K 4.7 01/02/2017   CL 100 01/02/2017   CREATININE 1.08 01/02/2017   BUN 31 (H) 01/02/2017   CO2 34 (H) 01/02/2017   TSH 0.490 08/09/2016   PSA 0.07 (L) 09/16/2015   INR 1.08 11/05/2014   HGBA1C 6.3 10/24/2016    --------------------------------------------------------------------------------------------------------------------- Mr Lumbar Spine Wo Contrast  Result Date: 03/29/2016 CLINICAL DATA:  Initial evaluation for chronic low back pain with left-sided sciatica, worsened. EXAM: MRI LUMBAR SPINE WITHOUT CONTRAST TECHNIQUE: Multiplanar, multisequence MR imaging of the lumbar spine was performed. No intravenous contrast was administered. COMPARISON:  Prior MRI from 09/22/2015. FINDINGS: Segmentation: L normal segmentation. Lowest well-formed disc is labeled the L5-S1 level. Same numbering system is employed as on previous exams. Alignment: Straightening with slight reversal of the normal lumbar lordosis, stable. No listhesis. Vertebrae: Vertebral body heights are maintained. No evidence for acute or chronic fracture. Reactive endplate changes about the left aspect of the L5-S1 interspace, similar to previous. Postoperative changes from recent decompressive left hemi laminectomy with micro discectomy seen at L5-S1. No concerning features identified. Conus medullaris: Extends to the L1 level and appears normal. Paraspinal and other soft tissues: Normal expected postoperative no concerning features identified. Paraspinous soft tissues otherwise within normal limits. Visualized visceral structures are normal. No retroperitoneal adenopathy. Changes related to decompressive laminectomy present within the posterior paraspinous soft tissues of the lower back. Disc levels: L1-2: Degenerative disc desiccation with intervertebral disc space narrowing and mild disc bulge. No stenosis. L2-3:  Degenerative disc desiccation with intervertebral disc space narrowing and mild disc bulge. No stenosis. L3-4: Degenerative disc desiccation with mild diffuse disc bulge. Mild facet hypertrophy. Mild right foraminal stenosis related to disc bulge and facet disease, stable. No canal or left foraminal narrowing. L4-5: Degenerative disc bulge with intervertebral disc space narrowing and disc desiccation. Disc bulging a centric to the right without focal disc protrusion. Superimposed bilateral facet arthrosis with ligamentum flavum hypertrophy. Reactive effusions within the bilateral L4-5 facets, stable. Minimal bilateral subarticular stenosis, slightly greater on the left. Mild bilateral foraminal narrowing related to disc bulge and facet disease, stable. L5-S1: Postoperative changes from interval decompressive left hemi laminectomy with micro discectomy. Previously seen left subarticular disc protrusion has been largely resected, although there appears to be a small residual central disc protrusion that minimally indents the ventral thecal sac (series 6, image 28). Soft tissue density within the left lateral epidural space likely reflects postoperative granulation tissue, although evaluation somewhat limited due to lack of IV contrast. This surrounds the transiting S1 nerve root in the left lateral recess (series 7, image 28). No significant canal stenosis. Superimposed bilateral facet arthrosis with reactive effusions in the bilateral L5-S1 facets is similar to previous. Mild left foraminal narrowing is relatively unchanged. No significant right foraminal stenosis. IMPRESSION: 1. Postoperative changes from recent decompressive left hemi laminectomy with microdiskectomy at L5-S1. Postoperative granulation tissue within the left epidural space, surrounding the descending left S1 nerve root. No residual stenosis or concerning features identified. 2. 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 No other significant stenosis or evidence for impingement. Electronically Signed   By: Jeannine Boga M.D.   On: 03/29/2016 13:10       ---------------------------------------------------------------------------------------------------------------------- Past Medical History:  Diagnosis Date  . Anxiety   . Anxiety and depression   . Arthritis   . Colon polyps   . Dementia   . Depression   . Diabetes mellitus type 2 in nonobese (HCC)   . GERD (gastroesophageal reflux disease)   . Headache   . 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[place])   . History of hiatal hernia   . Hypercholesteremia   . Hypertension   . Hyperthyroidism   . Neuropathic pain   . Primary localized osteoarthritis of right knee   . Stomach ulcer   . Tremor, essential     Past Surgical History:  Procedure Laterality Date  . COLONOSCOPY WITH PROPOFOL N/A 09/14/2016   Procedure: COLONOSCOPY WITH PROPOFOL;  Surgeon: Manya Silvas, MD;  [facility] ENDOSCOPY;  Service: Endoscopy;  Laterality: N/A;  . esophageal stretch    . JOINT REPLACEMENT    . LUMBAR LAMINECTOMY/DECOMPRESSION MICRODISCECTOMY Left 02/03/2016   Procedure: LEFT L5-S1 DISKECTOMY;  Surgeon: Leeroy Cha, MD;  Location: [place] NEURO ORS;  Service: Neurosurgery;  Laterality: Left;  LEFT L5-S1 DISKECTOMY  . TONSILLECTOMY    . TOTAL KNEE ARTHROPLASTY Right 11/15/2014   Procedure: TOTAL KNEE ARTHROPLASTY;  Surgeon: Elsie Saas, MD;  Location: [place];  Service: Orthopedics;  Laterality: Right;    Family History  Problem Relation Age of Onset  . Heart attack Mother   . Heart disease Mother   . Hypertension Father   . Depression Father   . Kidney cancer Neg Hx   . Prostate cancer Neg Hx     Social History  Substance Use Topics  . Smoking status: Current Every Day Smoker    Packs/day: 1.00    Years: 60.00    Types: Cigarettes    Last attempt to quit: 08/2016  . Smokeless tobacco: Never Used  . 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 Marital status: Married    Spouse name: N/A  . Number of children: N/A  . Years of education: N/A   Social History Main Topics  . Smoking status: Current Every Day Smoker    Packs/day: 1.00    Years: 60.00    Types: Cigarettes    Last attempt to quit: 08/2016  . Smokeless tobacco: Never Used  . 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 Drug use: No  . Sexual activity: Not Currently   Other Topics Concern  . None   Social History Narrative  . None     BP 128/77   Pulse 71   Temp 98.1 F (36.7 C) (Oral)   Resp 16   Ht 5\' 8"  (1.727 m)   Wt 175 lb (79.4 kg)   SpO2 93%   BMI 26.61 kg/m    BP Readings from Last 3 Encounters:  01/22/17 128/77  01/02/17 120/72  12/12/16 106/63     Wt Readings from Last 3 Encounters:  01/22/17 175 lb (79.4 kg)  01/02/17 177 lb 6.4 oz (80.5 kg)  12/12/16 182 lb (82.6 kg)     ----------------------------------------------------------------------------------------------------------------------  ROS Review of Systems  Cardiac: No angina or palpitations GI: No constipation or hematochezia  CNS: No sedation Objective:  BP 128/77   Pulse 71   Temp 98.1 F (36.7 C) (Oral)   Resp 16   Ht 5\' 8"  (1.727 m)   Wt 175 lb (79.4 kg)   SpO2 93%   BMI 26.61 kg/m   Physical Exam Heart is regular rate and rhythm without murmur Lungs are clear to auscultation He has some paraspinous muscle tenderness in the left side. His strength appears to be at baseline and his muscle tone and bulk is at baseline.The posterior scar is well healed. He continues to have a slight positive straight leg raise left side rocks my 30.  Assessment & Plan:   Nick was seen today for hip pain and leg pain.  Diagnoses and all orders for  this visit:  Displacement of lumbar intervertebral disc without myelopathy  Sciatica of left side associated with disorder of lumbar spine -     Lumbar Epidural Injection -     Lumbar Epidural Injection -     Lumbar Epidural Injection; Future -     triamcinolone acetonide (KENALOG-40) injection 40 mg; 1 mL (40 mg total) by Other route once. -     sodium chloride flush (NS) 0.9 % injection 10 mL; 10 mLs by Other route once. -     ropivacaine (PF) 2 mg/mL (0.2%) (NAROPIN) injection 10 mL; 10 mLs by Epidural route once. -     midazolam (VERSED) 5 MG/5ML injection 5 mg; Inject 5 mLs (5 mg total) into the vein once. -     lidocaine (PF) (XYLOCAINE) 1 % injection 5 mL; Inject 5 mLs into the skin once. -     lactated ringers infusion 1,000 mL; Inject 1,000 mLs into the vein continuous. -     iopamidol (ISOVUE-M) 41 % intrathecal injection 20 mL; 20 mLs by Other route once as needed for contrast.  Chronic left-sided low back pain with left-sided sciatica -     Lumbar Epidural Injection -     Lumbar Epidural Injection -     Lumbar Epidural Injection; Future  Other orders -     Discontinue: oxyCODONE-acetaminophen (PERCOCET) 7.5-325 MG tablet; Take 1 tablet by mouth 3 (three) times daily. -     oxyCODONE-acetaminophen (PERCOCET) 7.5-325 MG tablet; Take 1 tablet by mouth 3 (three) times daily.     ----------------------------------------------------------------------------------------------------------------------  Problem List Items Addressed This Visit      Other   Chronic lumbar pain   Relevant Medications   triamcinolone acetonide (KENALOG-40) injection 40 mg (Completed)   oxyCODONE-acetaminophen (PERCOCET) 7.5-325 MG tablet   Other Relevant Orders   Lumbar Epidural Injection   Lumbar Epidural Injection    Other Visit Diagnoses    Displacement of lumbar intervertebral disc without myelopathy    -  Primary   Sciatica of left side associated with disorder of lumbar spine        Relevant Medications   triamcinolone acetonide (KENALOG-40) injection 40 mg (Completed)   sodium chloride flush (NS) 0.9 % injection 10 mL (Completed)   ropivacaine (PF) 2 mg/mL (0.2%) (NAROPIN) injection 10 mL (Completed)   midazolam (VERSED) 5 MG/5ML injection 5 mg   lidocaine (PF) (XYLOCAINE) 1 % injection 5 mL (Completed)   lactated ringers infusion 1,000 mL   iopamidol (ISOVUE-M) 41 % intrathecal injection 20 mL   Other Relevant Orders   Lumbar Epidural Injection   Lumbar Epidural Injection      ----------------------------------------------------------------------------------------------------------------------  1. Sciatica of left side associated with disorder of lumbar spine  Unfortunately his pain has been quite recalcitrant and recurrent. Following his surgery has continued to have some left posterior leg pain but this is responding favorably to the caudal epidurals and seems to be quieting down at this point. He feels like the intensity of the pain is diminished and less frequent. He feels that this is attributed to the caudal epidural injections. We will proceed with a repeat injection today with return to clinic in 2 months for possible repeat injection at that time. Hopefully at that point we can begin to wean these back in frequency. I've encouraged him to continue with back stretching strengthening exercises and core strengthening. We'll continue on his current medication regimen at present with refills written for September 30 and October 30. He has been compliant with his regimen and his narcotic assessment sheet has been noted. [facility] practitioner database information is also found to be appropriate.   2. Facet arthritis left side.  3. Chronic left-sided low back pain with left-sided sciatica  4. History of alcohol abuse and he continues to  abstain. ----------------------------------------------------------------------------------------------------------------------  I am having Mr. Avina maintain his divalproex, donepezil, colestipol, aspirin EC, propranolol, finasteride, acetaminophen, metoprolol succinate, omeprazole, memantine, primidone, lisinopril-hydrochlorothiazide, glimepiride, QUEtiapine, sitaGLIPtin, metFORMIN, MELATONIN PO, diphenoxylate-atropine, budesonide, tamsulosin, fenofibrate, buPROPion, gabapentin, simvastatin, and oxyCODONE-acetaminophen. We administered triamcinolone acetonide, sodium chloride flush, ropivacaine (PF) 2 mg/mL (0.2%), lidocaine (PF), and iopamidol.   Meds ordered this encounter  Medications  . triamcinolone acetonide (KENALOG-40) injection 40 mg  . sodium chloride flush (NS) 0.9 % injection 10 mL  . ropivacaine (PF) 2 mg/mL (0.2%) (NAROPIN) injection 10 mL  . midazolam (VERSED) 5 MG/5ML injection 5 mg  . lidocaine (PF) (XYLOCAINE) 1 % injection 5 mL  . lactated ringers infusion 1,000 mL  . iopamidol (ISOVUE-M) 41 % intrathecal injection 20 mL  . DISCONTD: oxyCODONE-acetaminophen (PERCOCET) 7.5-325 MG tablet    Sig: Take 1 tablet by mouth 3 (three) times daily.    Dispense:  100 tablet    Refill:  0    Dont fill until [id number]  . oxyCODONE-acetaminophen (PERCOCET) 7.5-325 MG tablet    Sig: Take 1 tablet by mouth 3 (three) times daily.    Dispense:  100 tablet    Refill:  0    Dont fill until [id number]   Procedure: Caudal epidural steroid under fluoroscopic guidance without sedation   After informed consent was obtained and the risks benefits reviewed patient chose to pursue a caudal epidural steroid injection today. With the patient in the prone position ,  Using AP and lateral fluoroscopic guidance I identified the area overlying the sacral hiatus. This area was broadly prepped with DuraPrep 3 and we utilized strict aseptic technique during the procedure. 1% lidocaine was infiltrated  2 cc using a 25-gauge needle overlying the sacral cornu and then using lateral fluoroscopic guidance I advanced an 18-gauge Touhy needle approximately 2 cm through the sacral hiatus. Confirmation was with 2 cc of Isovue yielding good epidural spread and no evidence of IV or subarachnoid uptake. This was followed by an injection of 5 cc of saline mixed with 1 cc of ropivacaine 0.2% and 40 mg of triamcinolone. This was tolerated without difficulty the patient was convalesced and discharged home in stable condition for follow-up as mentioned. JA Follow-up: Return for procedure, evaluation.    Molli Barrows, MD @DATE @  The [place] practitioner database for opioid medications on this patient has been reviewed by me and my staff   Greater than 50% of  the total encounter time was spent in counseling and / or coordination of care.     This dictation was performed utilizing Systems analyst.  Please excuse any unintentional or mistaken typographical errors as a result.

## 2017-01-29 ENCOUNTER — Ambulatory Visit (INDEPENDENT_AMBULATORY_CARE_PROVIDER_SITE_OTHER): Payer: PPO | Admitting: Family Medicine

## 2017-01-29 ENCOUNTER — Encounter: Payer: Self-pay | Admitting: Family Medicine

## 2017-01-29 VITALS — BP 110/60 | HR 92 | Temp 98.0°F | Wt 174.2 lb

## 2017-01-29 DIAGNOSIS — Z23 Encounter for immunization: Secondary | ICD-10-CM | POA: Diagnosis not present

## 2017-01-29 DIAGNOSIS — D692 Other nonthrombocytopenic purpura: Secondary | ICD-10-CM

## 2017-01-29 LAB — COMPREHENSIVE METABOLIC PANEL
ALT: 29 U/L (ref 0–53)
AST: 28 U/L (ref 0–37)
Albumin: 4.3 g/dL (ref 3.5–5.2)
Alkaline Phosphatase: 23 U/L — ABNORMAL LOW (ref 39–117)
BUN: 37 mg/dL — ABNORMAL HIGH (ref 6–23)
CO2: 33 mEq/L — ABNORMAL HIGH (ref 19–32)
Calcium: 10.4 mg/dL (ref 8.4–10.5)
Chloride: 97 mEq/L (ref 96–112)
Creatinine, Ser: 1.14 mg/dL (ref 0.40–1.50)
GFR: 66.2 mL/min (ref >60.00)
Glucose, Bld: 168 mg/dL — ABNORMAL HIGH (ref 70–99)
Potassium: 3.4 mEq/L — ABNORMAL LOW (ref 3.5–5.1)
Sodium: 139 mEq/L (ref 135–145)
Total Bilirubin: 0.3 mg/dL (ref 0.2–1.2)
Total Protein: 6.7 g/dL (ref 6.0–8.3)

## 2017-01-29 LAB — CBC
HCT: 36.6 % — ABNORMAL LOW (ref 39.0–52.0)
Hemoglobin: 12.3 g/dL — ABNORMAL LOW (ref 13.0–17.0)
MCHC: 33.7 g/dL (ref 30.0–36.0)
MCV: 95.3 fl (ref 78.0–100.0)
Platelets: 303 10*3/uL (ref 150.0–400.0)
RBC: 3.84 Mil/uL — ABNORMAL LOW (ref 4.22–5.81)
RDW: 13 % (ref 11.5–15.5)
WBC: 9 10*3/uL (ref 4.0–10.5)

## 2017-01-29 LAB — SEDIMENTATION RATE: Sed Rate: 11 mm/hr (ref 0–20)

## 2017-01-29 NOTE — Progress Notes (Signed)
  Tommi Rumps, MD Phone: [phone]  James Hood is a 76 y.o. male who presents today for rash.  Patient notes rash for 4 days. Bilateral arms left greater than right. It appears to be purpura. Notes that popped up out of nowhere and a couple of them bled. He notes no injuries to his arms. He has no pain, itching. Notes they're not palpable. No new soaps or detergents. No fevers. He has had them one time previously. He did get a cortisone injection somewhat recently in his back.   ROS see history of present illness  Objective  Physical Exam Vitals:   01/29/17 0826  BP: 110/60  Pulse: 92  Temp: 98 F (36.7 C)  SpO2: 94%    BP Readings from Last 3 Encounters:  01/29/17 110/60  01/22/17 128/77  01/02/17 120/72   Wt Readings from Last 3 Encounters:  01/29/17 174 lb 3.2 oz (79 kg)  01/22/17 175 lb (79.4 kg)  01/02/17 177 lb 6.4 oz (80.5 kg)    Physical Exam  Constitutional: No distress.  Cardiovascular: Normal rate, regular rhythm and normal heart sounds.   Skin: He is not diaphoretic.  Bilateral nonpalpable purpura on dorsal aspect forearms left greater than right, no tenderness, no erythema     Assessment/Plan: Please see individual problem list.  Purpura (HCC) Nonpalpable purpura noted on bilateral forearms left greater than right. Potentially could be related to his cortisone injections as they can cause bruising though these do not particularly appear to be bruises. We'll check lab work as outlined below. We'll refer to dermatology.   Orders Placed This Encounter  Procedures  . Flu vaccine HIGH DOSE PF  . CBC  . Comp Met (CMET)  . Sed Rate (ESR)  . Ambulatory referral to Dermatology    Referral Priority:   Routine    Referral Type:   Consultation    Referral Reason:   Specialty Services Required    Requested Specialty:   Dermatology    Number of Visits Requested:   1   Tommi Rumps, MD [place]

## 2017-01-29 NOTE — Patient Instructions (Signed)
Nice to see. We are going to check some lab work and refer you to dermatology for your rash.

## 2017-01-29 NOTE — Assessment & Plan Note (Signed)
Nonpalpable purpura noted on bilateral forearms left greater than right. Potentially could be related to his cortisone injections as they can cause bruising though these do not particularly appear to be bruises. We'll check lab work as outlined below. We'll refer to dermatology.

## 2017-01-30 ENCOUNTER — Telehealth: Payer: Self-pay | Admitting: Family Medicine

## 2017-01-30 NOTE — Telephone Encounter (Signed)
Please advise 

## 2017-01-30 NOTE — Telephone Encounter (Signed)
Pt called wanting to know if he can get a letter stating he can perform physical activities at the [facility]. Pt will pick up letter call when its ready. Pt is scheduled to go back tomorrow 01/31/2017.   Call pt @ [phone]. Thank you!

## 2017-01-31 DIAGNOSIS — K52832 Lymphocytic colitis: Secondary | ICD-10-CM | POA: Diagnosis not present

## 2017-01-31 DIAGNOSIS — R197 Diarrhea, unspecified: Secondary | ICD-10-CM | POA: Diagnosis not present

## 2017-01-31 DIAGNOSIS — R933 Abnormal findings on diagnostic imaging of other parts of digestive tract: Secondary | ICD-10-CM | POA: Diagnosis not present

## 2017-02-04 NOTE — Telephone Encounter (Signed)
Please advise 

## 2017-02-04 NOTE — Telephone Encounter (Signed)
Form completed.

## 2017-02-04 NOTE — Telephone Encounter (Signed)
Pt states that the letter just needs to say that his high blood pressure is well controlled and that he can exercise.

## 2017-02-04 NOTE — Telephone Encounter (Signed)
Palled back looking for an update. Pt states that he will unable to keep tomorrow appt if he does not have that letter. Please advise, thank you!

## 2017-02-05 ENCOUNTER — Ambulatory Visit: Payer: PPO | Admitting: Family Medicine

## 2017-02-05 NOTE — Telephone Encounter (Signed)
Patient notified

## 2017-02-14 ENCOUNTER — Other Ambulatory Visit: Payer: Self-pay | Admitting: Internal Medicine

## 2017-02-21 ENCOUNTER — Telehealth: Payer: Self-pay | Admitting: Family Medicine

## 2017-02-21 DIAGNOSIS — S161XXA Strain of muscle, fascia and tendon at neck level, initial encounter: Secondary | ICD-10-CM | POA: Diagnosis not present

## 2017-02-21 NOTE — Telephone Encounter (Signed)
Advised patient that could not prescribe muscle relaxers without OV and PCP was out of office. Patient stated he would go to walk in today.

## 2017-02-21 NOTE — Telephone Encounter (Signed)
Pt called and stated that he worked out at [employer] last week and over did it. Pt states that his back and shoulders are really sore and can barely move. Pt would like to know if Dr. Caryl Bis could call in some flexeril for him. Please advise, thank you!  [place], [place]  Call pt @ [phone]

## 2017-02-21 NOTE — Telephone Encounter (Signed)
Agree 

## 2017-02-27 ENCOUNTER — Encounter: Payer: Self-pay | Admitting: Family Medicine

## 2017-02-27 ENCOUNTER — Ambulatory Visit (INDEPENDENT_AMBULATORY_CARE_PROVIDER_SITE_OTHER): Payer: PPO | Admitting: Family Medicine

## 2017-02-27 VITALS — BP 120/68 | HR 92 | Temp 98.2°F | Wt 176.8 lb

## 2017-02-27 DIAGNOSIS — F419 Anxiety disorder, unspecified: Secondary | ICD-10-CM | POA: Diagnosis not present

## 2017-02-27 DIAGNOSIS — R61 Generalized hyperhidrosis: Secondary | ICD-10-CM

## 2017-02-27 DIAGNOSIS — F32A Depression, unspecified: Secondary | ICD-10-CM

## 2017-02-27 DIAGNOSIS — E041 Nontoxic single thyroid nodule: Secondary | ICD-10-CM

## 2017-02-27 DIAGNOSIS — K529 Noninfective gastroenteritis and colitis, unspecified: Secondary | ICD-10-CM | POA: Diagnosis not present

## 2017-02-27 DIAGNOSIS — F329 Major depressive disorder, single episode, unspecified: Secondary | ICD-10-CM

## 2017-02-27 DIAGNOSIS — Z125 Encounter for screening for malignant neoplasm of prostate: Secondary | ICD-10-CM

## 2017-02-27 DIAGNOSIS — K760 Fatty (change of) liver, not elsewhere classified: Secondary | ICD-10-CM

## 2017-02-27 DIAGNOSIS — E1142 Type 2 diabetes mellitus with diabetic polyneuropathy: Secondary | ICD-10-CM

## 2017-02-27 LAB — PSA, MEDICARE: PSA: 0.01 ng/ml — ABNORMAL LOW (ref 0.10–4.00)

## 2017-02-27 LAB — HEMOGLOBIN A1C: Hgb A1c MFr Bld: 6.7 % — ABNORMAL HIGH (ref 4.6–6.5)

## 2017-02-27 NOTE — Assessment & Plan Note (Signed)
Noted on imaging. Discussed having patient mentioned this to his GI physician when he follows up with them to see if any further workup is needed. 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 Most recent LFTs normal.

## 2017-02-27 NOTE — Assessment & Plan Note (Signed)
Prior TSH normal. Ultrasound recommended repeat in one year. Order has been placed.

## 2017-02-27 NOTE — Progress Notes (Signed)
Tommi Rumps, MD Phone: [phone]  James Hood is a 76 y.o. male who presents today for follow-up.  Patient previously evaluated for weight loss. He had a CT chest abdomen and pelvis given report of night sweats. Night sweats are not drenching. Notes occasional dampness to his pillow. Still occasionally has them. He wakes up and his pillow will be damp. Does not have to change clothes. Weight has actually gone up a few pounds. He attributes the weight loss to dietary changes. CT scan did reveal fatty liver. He only takes the Tylenol that is in his oxycodone. He is a former alcoholic though has not had anything to drink in several decades. They also showed CAD. He is on risk modifying medications. No chest pain or shortness of breath. He's also joined a gym to lose weight. His diarrhea has begun to improve with treatment through GI. CT scan did reveal possible esophageal thickening though it appears that GI has decided to hold off on repeat endoscopy given he had one earlier this year. Also slight thickening of the walls of the sigmoid colon and lower descending colon favored to be chronic wall hypertrophy related to underlying diverticulosis. Nodule in the right thyroid lobe that has undergone ultrasound and recommended follow-up in one year. He had a colonoscopy earlier this year. Also had EGD earlier this year.  Patient notes he does feel depressed. His wife's mother is dying and he does feel lonely as his wife is spending more time with her. His mother-in-law is in hospice and not as mobile. He does have some passive suicidal ideation though no active suicidal ideation. No plan or intent to harm himself. He is currently on Wellbutrin, Depakote, and Seroquel. He has not followed with psychiatry or a psychologist in some time.  Diabetes: He does note taking his diabetic medications. He is taking 2 mg of glimepiride and he is taking metformin. He notes occasional lows down into the 40s. He has  never checked his blood sugar when he is sweaty at night. Otherwise sugars around 120.  PMH: Smoker   ROS see history of present illness  Objective  Physical Exam Vitals:   02/27/17 1322  BP: 120/68  Pulse: 92  Temp: 98.2 F (36.8 C)  SpO2: 95%    BP Readings from Last 3 Encounters:  02/27/17 120/68  01/29/17 110/60  01/22/17 128/77   Wt Readings from Last 3 Encounters:  02/27/17 176 lb 12.8 oz (80.2 kg)  01/29/17 174 lb 3.2 oz (79 kg)  01/22/17 175 lb (79.4 kg)    Physical Exam  Constitutional: No distress.  Cardiovascular: Normal rate and normal heart sounds.   Pulmonary/Chest: Effort normal and breath sounds normal.  Abdominal: Soft. Bowel sounds are normal. He exhibits no distension. There is no tenderness. There is no rebound and no guarding.  Musculoskeletal: He exhibits no edema.  Neurological: He is alert. Gait normal.  Skin: Skin is warm and dry. He is not diaphoretic.     Assessment/Plan: Please see individual problem list.  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 He will follow with GI for this.  Type 2 diabetes mellitus (HCC) Seems to be well controlled. Discussed discontinuing the glimepiride given lows. Check A1c today.  Anxiety and depression Does feel depressed. He is on Wellbutrin. Also on Depakote and Seroquel which could be used for mood. Those are prescribed by his neurologist. He notes SI though no intent or plan to harm himself. Contracted for safety. If he develops intent or plan to harm  himself or go to the emergency room. Consider psychiatry referral if not improving.  Night sweats Very light sweating at night. His pillow is slightly damp. CT scan unrevealing for cause. Potentially could be related to hypoglycemia or his medications. Offered referral to hematology for evaluation though he deferred at this time. Weight loss has stabilized and actually improved some. That could be related to depression versus his intermittent diarrhea. He will  continue to monitor at this time and if does not improve or if worsens let us know. Last bit of cancer screening to be done would be a PSA. This is ordered today.  Thyroid nodule Prior TSH normal. Ultrasound recommended repeat in one year. Order has been placed.  Fatty liver Noted on imaging. Discussed having patient mentioned this to his GI physician when he follows up with them to see if any further workup is needed. 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 Most recent LFTs normal.   Orders Placed This Encounter  Procedures  . US THYROID    Standing Status:   Future    Standing Expiration Date:   04/29/2018    Scheduling Instructions:     Schedule for [place] in September 2019.    Order Specific Question:   Reason for Exam (SYMPTOM  OR DIAGNOSIS REQUIRED)    Answer:   thyroid nodule    Order Specific Question:   Preferred imaging location?    Answer:   [facility]  . PSA, Medicare  . HgB A1c    Tommi Rumps, MD [place]

## 2017-02-27 NOTE — Patient Instructions (Signed)
Nice to see you. Please continue to follow with GI for your diarrhea. Please mention the fatty liver the next time he sees him. Please limit your Tylenol intake. Please monitor your depression and if this worsens please let us know. If you develop intent to harm yourself please be evaluated immediately.

## 2017-02-27 NOTE — Assessment & Plan Note (Addendum)
Very light sweating at night. His pillow is slightly damp. CT scan unrevealing for cause. Potentially could be related to hypoglycemia or his medications. Offered referral to hematology for evaluation though he deferred at this time. Weight loss has stabilized and actually improved some. That could be related to depression versus his intermittent diarrhea. He will continue to monitor at this time and if does not improve or if worsens let us know. Last bit of cancer screening to be done would be a PSA. This is ordered today.

## 2017-02-27 NOTE — Assessment & Plan Note (Signed)
Seems to be well controlled. Discussed discontinuing the glimepiride given lows. Check A1c today.

## 2017-02-27 NOTE — Assessment & Plan Note (Addendum)
Has improved. He will follow with GI for this.

## 2017-02-27 NOTE — Assessment & Plan Note (Signed)
Does feel depressed. He is on Wellbutrin. Also on Depakote and Seroquel which could be used for mood. Those are prescribed by his neurologist. He notes SI though no intent or plan to harm himself. Contracted for safety. If he develops intent or plan to harm himself or go to the emergency room. Consider psychiatry referral if not improving.

## 2017-03-06 ENCOUNTER — Other Ambulatory Visit: Payer: Self-pay | Admitting: Family Medicine

## 2017-03-09 ENCOUNTER — Other Ambulatory Visit: Payer: Self-pay | Admitting: Family Medicine

## 2017-03-18 ENCOUNTER — Ambulatory Visit (HOSPITAL_BASED_OUTPATIENT_CLINIC_OR_DEPARTMENT_OTHER): Payer: PPO | Admitting: Anesthesiology

## 2017-03-18 ENCOUNTER — Encounter: Payer: Self-pay | Admitting: Anesthesiology

## 2017-03-18 ENCOUNTER — Other Ambulatory Visit: Payer: Self-pay | Admitting: Anesthesiology

## 2017-03-18 ENCOUNTER — Ambulatory Visit
Admission: RE | Admit: 2017-03-18 | Discharge: 2017-03-18 | Disposition: A | Discharge status: Home / Self Care | Payer: PPO | Source: Ambulatory Visit | Attending: Anesthesiology | Admitting: Anesthesiology

## 2017-03-18 VITALS — BP 102/63 | HR 53 | Temp 97.2°F | Resp 12 | Ht 70.0 in | Wt 174.0 lb

## 2017-03-18 DIAGNOSIS — Z9889 Other specified postprocedural states: Secondary | ICD-10-CM | POA: Insufficient documentation

## 2017-03-18 DIAGNOSIS — E119 Type 2 diabetes mellitus without complications: Secondary | ICD-10-CM | POA: Insufficient documentation

## 2017-03-18 DIAGNOSIS — M25562 Pain in left knee: Secondary | ICD-10-CM | POA: Diagnosis not present

## 2017-03-18 DIAGNOSIS — M5442 Lumbago with sciatica, left side: Secondary | ICD-10-CM | POA: Diagnosis not present

## 2017-03-18 DIAGNOSIS — F419 Anxiety disorder, unspecified: Secondary | ICD-10-CM | POA: Diagnosis not present

## 2017-03-18 DIAGNOSIS — F1190 Opioid use, unspecified, uncomplicated: Secondary | ICD-10-CM

## 2017-03-18 DIAGNOSIS — F17210 Nicotine dependence, cigarettes, uncomplicated: Secondary | ICD-10-CM | POA: Diagnosis not present

## 2017-03-18 DIAGNOSIS — M5386 Other specified dorsopathies, lumbar region: Secondary | ICD-10-CM | POA: Diagnosis not present

## 2017-03-18 DIAGNOSIS — I10 Essential (primary) hypertension: Secondary | ICD-10-CM | POA: Insufficient documentation

## 2017-03-18 DIAGNOSIS — E0590 Thyrotoxicosis, unspecified without thyrotoxic crisis or storm: Secondary | ICD-10-CM | POA: Insufficient documentation

## 2017-03-18 DIAGNOSIS — Z79899 Other long term (current) drug therapy: Secondary | ICD-10-CM | POA: Diagnosis not present

## 2017-03-18 DIAGNOSIS — Z79891 Long term (current) use of opiate analgesic: Secondary | ICD-10-CM | POA: Diagnosis not present

## 2017-03-18 DIAGNOSIS — Z7984 Long term (current) use of oral hypoglycemic drugs: Secondary | ICD-10-CM | POA: Insufficient documentation

## 2017-03-18 DIAGNOSIS — F0390 Unspecified dementia without behavioral disturbance: Secondary | ICD-10-CM | POA: Insufficient documentation

## 2017-03-18 DIAGNOSIS — Z8249 Family history of ischemic heart disease and other diseases of the circulatory system: Secondary | ICD-10-CM | POA: Insufficient documentation

## 2017-03-18 DIAGNOSIS — M47816 Spondylosis without myelopathy or radiculopathy, lumbar region: Secondary | ICD-10-CM

## 2017-03-18 DIAGNOSIS — Z96651 Presence of right artificial knee joint: Secondary | ICD-10-CM | POA: Diagnosis not present

## 2017-03-18 DIAGNOSIS — E7800 Pure hypercholesterolemia, unspecified: Secondary | ICD-10-CM | POA: Insufficient documentation

## 2017-03-18 DIAGNOSIS — Z818 Family history of other mental and behavioral disorders: Secondary | ICD-10-CM | POA: Insufficient documentation

## 2017-03-18 DIAGNOSIS — K449 Diaphragmatic hernia without obstruction or gangrene: Secondary | ICD-10-CM | POA: Diagnosis not present

## 2017-03-18 DIAGNOSIS — G8929 Other chronic pain: Secondary | ICD-10-CM

## 2017-03-18 DIAGNOSIS — Z86010 Personal history of colonic polyps: Secondary | ICD-10-CM | POA: Diagnosis not present

## 2017-03-18 DIAGNOSIS — M4696 Unspecified inflammatory spondylopathy, lumbar region: Secondary | ICD-10-CM

## 2017-03-18 DIAGNOSIS — M5126 Other intervertebral disc displacement, lumbar region: Secondary | ICD-10-CM

## 2017-03-18 DIAGNOSIS — Z7982 Long term (current) use of aspirin: Secondary | ICD-10-CM | POA: Diagnosis not present

## 2017-03-18 DIAGNOSIS — R200 Anesthesia of skin: Secondary | ICD-10-CM | POA: Diagnosis not present

## 2017-03-18 DIAGNOSIS — F329 Major depressive disorder, single episode, unspecified: Secondary | ICD-10-CM | POA: Diagnosis not present

## 2017-03-18 DIAGNOSIS — Z8719 Personal history of other diseases of the digestive system: Secondary | ICD-10-CM | POA: Insufficient documentation

## 2017-03-18 DIAGNOSIS — R52 Pain, unspecified: Secondary | ICD-10-CM

## 2017-03-18 DIAGNOSIS — G250 Essential tremor: Secondary | ICD-10-CM | POA: Insufficient documentation

## 2017-03-18 DIAGNOSIS — K219 Gastro-esophageal reflux disease without esophagitis: Secondary | ICD-10-CM | POA: Insufficient documentation

## 2017-03-18 DIAGNOSIS — M25552 Pain in left hip: Secondary | ICD-10-CM | POA: Diagnosis not present

## 2017-03-18 MED ORDER — LIDOCAINE HCL (PF) 1 % IJ SOLN
5.0000 mL | Freq: Once | INTRAMUSCULAR | Status: AC
  Administered 2017-03-18: 5 mL via SUBCUTANEOUS

## 2017-03-18 MED ORDER — MIDAZOLAM HCL 5 MG/5ML IJ SOLN
5.0000 mg | Freq: Once | INTRAMUSCULAR | Status: AC
  Administered 2017-03-18: 3 mg via INTRAVENOUS

## 2017-03-18 MED ORDER — ROPIVACAINE HCL 2 MG/ML IJ SOLN
10.0000 mL | Freq: Once | INTRAMUSCULAR | Status: AC
  Administered 2017-03-18: 10 mL via EPIDURAL

## 2017-03-18 MED ORDER — TRIAMCINOLONE ACETONIDE 40 MG/ML IJ SUSP
40.0000 mg | Freq: Once | INTRAMUSCULAR | Status: AC
  Administered 2017-03-18: 40 mg

## 2017-03-18 MED ORDER — ROPIVACAINE HCL 2 MG/ML IJ SOLN
INTRAMUSCULAR | Status: AC
  Filled 2017-03-18: qty 10

## 2017-03-18 MED ORDER — LIDOCAINE HCL (PF) 1 % IJ SOLN
INTRAMUSCULAR | Status: AC
  Filled 2017-03-18: qty 5

## 2017-03-18 MED ORDER — IOPAMIDOL (ISOVUE-M 200) INJECTION 41%
20.0000 mL | Freq: Once | INTRAMUSCULAR | Status: DC | PRN
  Administered 2017-03-18: 10 mL
  Filled 2017-03-18: qty 20

## 2017-03-18 MED ORDER — IOPAMIDOL (ISOVUE-M 200) INJECTION 41%
INTRAMUSCULAR | Status: AC
  Filled 2017-03-18: qty 10

## 2017-03-18 MED ORDER — OXYCODONE-ACETAMINOPHEN 7.5-325 MG PO TABS: 1.0000 | ORAL_TABLET | Freq: Three times a day (TID) | ORAL | 0 refills | Status: DC

## 2017-03-18 MED ORDER — LACTATED RINGERS IV SOLN
1000.0000 mL | INTRAVENOUS | Status: DC
  Administered 2017-03-18: 1000 mL via INTRAVENOUS

## 2017-03-18 MED ORDER — MIDAZOLAM HCL 5 MG/5ML IJ SOLN
INTRAMUSCULAR | Status: AC
  Filled 2017-03-18: qty 5

## 2017-03-18 MED ORDER — SODIUM CHLORIDE 0.9 % IJ SOLN
INTRAMUSCULAR | Status: AC
Start: 2017-03-18 — End: 2017-03-18
  Filled 2017-03-18: qty 10

## 2017-03-18 MED ORDER — SODIUM CHLORIDE 0.9% FLUSH
10.0000 mL | Freq: Once | INTRAVENOUS | Status: AC
  Administered 2017-03-18: 10 mL

## 2017-03-18 MED ORDER — OXYCODONE-ACETAMINOPHEN 7.5-325 MG PO TABS
1.0000 | ORAL_TABLET | Freq: Three times a day (TID) | ORAL | 0 refills | Status: DC
Start: 2017-03-18 — End: 2017-03-18

## 2017-03-18 MED ORDER — TRIAMCINOLONE ACETONIDE 40 MG/ML IJ SUSP
INTRAMUSCULAR | Status: AC
  Filled 2017-03-18: qty 1

## 2017-03-18 NOTE — Patient Instructions (Addendum)
You were given 2 prescriptions for Percocet today.   Post-procedure Information What to expect: Most procedures involve the use of a local anesthetic (numbing medicine), and a steroid (anti-inflammatory medicine).  The local anesthetics may cause temporary numbness and weakness of the legs or arms, depending on the location of the block. This numbness/weakness may last 4-6 hours, depending on the local anesthetic used. In rare instances, it can last up to 24 hours. While numb, you must be very careful not to injure the extremity.  After any procedure, you could expect the pain to get better within 15-20 minutes. This relief is temporary and may last 4-6 hours. Once the local anesthetics wears off, you could experience discomfort, possibly more than usual, for up to 10 (ten) days. In the case of radiofrequencies, it may last up to 6 weeks. Surgeries may take up to 8 weeks for the healing process. The discomfort is due to the irritation caused by needles going through skin and muscle. To minimize the discomfort, we recommend using ice the first day, and heat from then on. The ice should be applied for 15 minutes on, and 15 minutes off. Keep repeating this cycle until bedtime. Avoid applying the ice directly to the skin, to prevent frostbite. Heat should be used daily, until the pain improves (4-10 days). Be careful not to burn yourself.  Occasionally you may experience muscle spasms or cramps. These occur as a consequence of the irritation caused by the needle sticks to the muscle and the blood that will inevitably be lost into the surrounding muscle tissue. Blood tends to be very irritating to tissues, which tend to react by going into spasm. These spasms may start the same day of your procedure, but they may also take days to develop. This late onset type of spasm or cramp is usually caused by electrolyte imbalances triggered by the steroids, at the level of the kidney. Cramps and spasms tend to respond  well to muscle relaxants, multivitamins (some are triggered by the procedure, but may have their origins in vitamin deficiencies), and "Gatorade", or any sports drinks that can replenish any electrolyte imbalances. (If you are a diabetic, ask your pharmacist to get you a sugar-free brand.) Warm showers or baths may also be helpful. Stretching exercises are highly recommended. General Instructions:  Be alert for signs of possible infection: redness, swelling, heat, red streaks, elevated temperature, and/or fever. These typically appear 4 to 6 days after the procedure. Immediately notify your doctor if you experience unusual bleeding, difficulty breathing, or loss of bowel or bladder control. If you experience increased pain, do not increase your pain medicine intake, unless instructed by your pain physician. Post-Procedure Care:  Be careful in moving about. Muscle spasms in the area of the injection may occur. Applying ice or heat to the area is often helpful. The incidence of spinal headaches after epidural injections ranges between 1.4% and 6%. If you develop a headache that does not seem to respond to conservative therapy, please let your physician know. This can be treated with an epidural blood patch.   Post-procedure numbness or redness is to be expected, however it should average 4 to 6 hours. If numbness and weakness of your extremities begins to develop 4 to 6 hours after your procedure, and is felt to be progressing and worsening, immediately contact your physician.   Diet:  If you experience nausea, do not eat until this sensation goes away. If you had a "Stellate Ganglion Block" for upper extremity "  Reflex Sympathetic Dystrophy", do not eat or drink until your hoarseness goes away. In any case, always start with liquids first and if you tolerate them well, then slowly progress to more solid foods. Activity:  For the first 4 to 6 hours after the procedure, use caution in moving about as you may  experience numbness and/or weakness. Use caution in cooking, using household electrical appliances, and climbing steps. If you need to reach your Doctor call our office: [phone] Monday-Thursday 8:00 am - 4:00 PM    Fridays: Closed     In case of an emergency: In case of emergency, call 911 or go to the nearest emergency room and have the physician there call us.  Interpretation of Procedure Every nerve block has two components: a diagnostic component, and a treatment component. Unrealistic expectations are the most common causes of "perceived failure".  In a perfect world, a single nerve block should be able to completely and permanently eliminate the pain. Sadly, the world is not perfect.  Most pain management nerve blocks are performed using local anesthetics and steroids. Steroids are responsible for any long-term benefit that you may experience. Their purpose is to decrease any chronic swelling that may exist in the area. Steroids begin to work immediately after being injected. However, most patients will not experience any benefits until 5 to 10 days after the injection, when the swelling has come down to the point where they can tell a difference. Steroids will only help if there is swelling to be treated. As such, they can assist with the diagnosis. If effective, they suggest an inflammatory component to the pain, and if ineffective, they rule out inflammation as the main cause or component of the problem. If the problem is one of mechanical compression, you will get no benefit from those steroids.   In the case of local anesthetics, they have a crucial role in the diagnosis of your condition. Most will begin to work within15 to 20 minutes after injection. The duration will depend on the type used (short- vs. Long-acting). It is of outmost importance that patients keep tract of their pain, after the procedure. To assist with this matter, a "Post-procedure Pain Diary" is provided. Make sure  to complete it and to bring it back to your follow-up appointment.  As long as the patient keeps accurate, detailed records of their symptoms after every procedure, and returns to have those interpreted, every procedure will provide [place] with invaluable information. Even a block that does not provide the patient with any relief, will always provide [place] with information about the mechanism and the origin of the pain. The only time a nerve block can be considered a waste of time is when patients do not keep track of the results, or do not keep their post-procedure appointment.  Reporting the results back to your physician The Pain Score  Pain is a subjective complaint. It cannot be seen, touched, or measured. We depend entirely on the patient's report of the pain in order to assess your condition and treatment. To evaluate the pain, we use a pain scale, where "0" means "No Pain", and a "10" is "the worst possible pain that you can even imagine" (i.e. something like been eaten alive by a shark or being torn apart by a lion).   You will frequently be asked to rate your pain. Please be as accurate, remember that medical decisions will be based on your responses. Please do not rate your pain above a 10.  Doing so is actually interpreted as "symptom magnification" (exaggeration), as well as lack of understanding with regards to the scale. To put this into perspective, when you tell us that your pain is at a 10 (ten), what you are saying is that there is nothing we can do to make this pain any worse. (Carefully think about that.)

## 2017-03-18 NOTE — Progress Notes (Signed)
Subjective:  Patient ID: James Hood, male    DOB: 1940-11-16  Age: 76 y.o. MRN: [id number]  CC: Hip Pain (left); Knee Pain (left); and Leg Pain (left)   Service Provided on Last Visit: Procedure (Caudal Epidural) PROCEDURE: Caudal epidural steroid under fluoroscopic guidance with moderate sedation  HPI James Hood presents for reevaluation. He was last seen in September at which point he had a caudal epidural steroid. He continues to derive approximate 75% improvement lasting 3-4 weeks with each of these procedures. His primary pain complaint revolves around left posterior lateral leg pain with heel and foot numbness and pain. He also has left lower back pain. He has had a series of caudal epidurals and lumbar facet blocks. The caudal epidural seemed to work the best for him. He is also taking opioid medications to give him good relief based on his narcotic assessment sheet and continue to provide functional improvement in his overall lifestyle. He takes these as prescribed and gets good short-term relief. Otherwise he's in his usual state of health with no new changes in lower extremity strength or function. He desires to proceed with a repeat caudal epidural today. He has been seen by his neurosurgeon following surgery and found to be a nonsurgical candidate.  History Malahki has a past medical history of Anxiety; Anxiety and depression; Arthritis; Colon polyps; Dementia; Depression; Diabetes mellitus type 2 in nonobese [facility]); GERD (gastroesophageal reflux disease); Headache; 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[place]); History of hiatal hernia; Hypercholesteremia; Hypertension; Hyperthyroidism; Neuropathic pain; Primary localized osteoarthritis of right knee; Stomach ulcer; and Tremor, essential.   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   His family history includes Depression in his father; Heart attack in his mother; Heart disease in his mother; Hypertension in his father.He reports that he has been smoking Cigarettes.  He has a 60.00 pack-year smoking history. He has never used smokeless tobacco. He reports that he does not drink alcohol or use drugs.  No results found for this or any previous visit.  ToxAssure Select 13  Date Value Ref Range Status  05/03/2016 FINAL  Final    Comment:    ==================================================================== TOXASSURE SELECT 13 (MW) ==================================================================== Test                             Result       Flag       Units Drug Present and Declared for Prescription Verification   Oxycodone                      140          EXPECTED   ng/mg creat   Oxymorphone                    127          EXPECTED   ng/mg creat   Noroxycodone                   903          EXPECTED   ng/mg creat   Noroxymorphone                 71           EXPECTED   ng/mg creat    Sources  of oxycodone are scheduled prescription medications.    Oxymorphone, noroxycodone, and noroxymorphone are expected    metabolites of oxycodone. Oxymorphone is also available as a    scheduled prescription medication.   Phenobarbital                  PRESENT      EXPECTED    Phenobarbital is an expected metabolite of primidone;    Phenobarbital may also be administered as a prescription drug. Drug Absent but Declared for Prescription Verification   Desmethyldiazepam              Not Detected UNEXPECTED ng/mg creat    Desmethyldiazepam is an expected metabolite of chlordiazepoxide,    clorazepate, halazepam, and prazepam.   Hydrocodone                    Not Detected UNEXPECTED ng/mg creat   Tramadol                       Not Detected  UNEXPECTED ==================================================================== Test                      Result    Flag   Units      Ref Range   Creatinine              75               mg/dL      >=20 ==================================================================== Declared Medications:  The flagging and interpretation on this report are based on the  following declared medications.  Unexpected results may arise from  inaccuracies in the declared medications.  **Note: The testing scope of this panel includes these medications:  Clorazepate (Tranxene)  Hydrocodone (Norco)  Oxycodone  Oxycodone (Percocet)  Primidone (Mysoline)  Tramadol (Ultram)  **Note: The testing scope of this panel does not include following  reported medications:  Acetaminophen (Norco)  Acetaminophen (Percocet)  Acetaminophen (Tylenol)  Aspirin  Atropine (Lomotil)  Bupropion (Zyban)  Colestipol  Cyclobenzaprine (Flexeril)  Diphenoxylate (Lomotil)  Divalproex (Depakote)  Donepezil (Aricept)  Fenofibrate (Tricor)  Finasteride (Proscar)  Gabapentin (Neurontin)  Glimepiride (Amaryl)  Hydrochlorothiazide (Prinzide)  Hydrochlorothiazide (Zestoretic)  Lisinopril (Prinzide)  Lisinopril (Zestoretic)  Memantine (Namenda)  Metformin (Glucophage)  Metoprolol (Lopressor)  Metoprolol (Toprol)  Omeprazole (Prilosec)  Prednisone (Sterapred)  Propranolol (Inderal)  Quetiapine (Seroquel)  Simvastatin (Zocor)  Tamsulosin (Flomax) ==================================================================== For clinical consultation, please call [phone]. ====================================================================     Outpatient Medications Prior to Visit  Medication Sig Dispense Refill  . acetaminophen (TYLENOL) 500 MG tablet Take 1,000 mg by mouth as needed.     Marland Kitchen aspirin EC 81 MG tablet Take 81 mg by mouth every morning.     . budesonide (ENTOCORT EC) 3 MG 24 hr capsule Take 3 mg by mouth daily.      Marland Kitchen buPROPion (WELLBUTRIN SR) 150 MG 12 hr tablet TAKE ONE TABLET BY MOUTH TWICE A DAY 180 tablet 0  . diphenoxylate-atropine (LOMOTIL) 2.5-0.025 MG tablet TAKE ONE TABLET BY MOUTH FOUR TIMES A DAY AS NEEDED FOR DIARRHEA    . divalproex (DEPAKOTE ER) 250 MG 24 hr tablet Take 250 mg by mouth 2 (two) times daily. Pt takes 250mg  in the morning, and 250mg  in the afternoon    . donepezil (ARICEPT) 10 MG tablet Take 10 mg by mouth at bedtime.    . fenofibrate (TRICOR) 145 MG tablet TAKE ONE TABLET BY MOUTH DAILY 90  tablet 0  . finasteride (PROSCAR) 5 MG tablet Take 1 tablet (5 mg total) by mouth daily. 30 tablet 11  . gabapentin (NEURONTIN) 600 MG tablet TAKE TWO TABLETS BY MOUTH THREE TIMES A DAY 180 tablet 1  . glimepiride (AMARYL) 4 MG tablet Take 1 tablet (4 mg total) by mouth 2 (two) times daily. 180 tablet 3  . lisinopril-hydrochlorothiazide (PRINZIDE,ZESTORETIC) 20-25 MG tablet Take 1 tablet by mouth 2 (two) times daily. 180 tablet 2  . MELATONIN PO Take 20 mg by mouth at bedtime.    . memantine (NAMENDA) 10 MG tablet Take 10 mg by mouth 2 (two) times daily.     . metFORMIN (GLUCOPHAGE) 500 MG tablet TAKE TWO TABLETS BY MOUTH TWICE A DAY WITH A MEAL 360 tablet 1  . metoprolol succinate (TOPROL-XL) 25 MG 24 hr tablet Take 25 mg by mouth daily.     Marland Kitchen omeprazole (PRILOSEC) 20 MG capsule 20 mg 2 (two) times daily before a meal.     . primidone (MYSOLINE) 50 MG tablet TAKE 5 TABLETS IN THE MORNING AND 3 TABLETS IN THE EVENING    . propranolol (INDERAL) 60 MG tablet Take 60 mg by mouth 2 (two) times daily.     . QUEtiapine (SEROQUEL) 25 MG tablet TAKE THREE TO FOUR TABLETS BY MOUTH AT NIGHT    . simvastatin (ZOCOR) 40 MG tablet TAKE 1 TABLET (40 MG TOTAL) BY MOUTH DAILY. 90 tablet 0  . sitaGLIPtin (JANUVIA) 100 MG tablet Take 1 tablet (100 mg total) by mouth daily. 90 tablet 3  . tamsulosin (FLOMAX) 0.4 MG CAPS capsule Take 1 capsule (0.4 mg total) by mouth daily after supper. 90 capsule 3  .  oxyCODONE-acetaminophen (PERCOCET) 7.5-325 MG tablet Take 1 tablet by mouth 3 (three) times daily. 100 tablet 0   No facility-administered medications prior to visit.    Lab Results  Component Value Date   WBC 9.0 01/29/2017   HGB 12.3 (L) 01/29/2017   HCT 36.6 (L) 01/29/2017   PLT 303.0 01/29/2017   GLUCOSE 168 (H) 01/29/2017   CHOL 161 09/16/2015   TRIG 393.0 (H) 09/16/2015   HDL 38.50 (L) 09/16/2015   LDLDIRECT 66.0 09/16/2015   ALT 29 01/29/2017   AST 28 01/29/2017   NA 139 01/29/2017   K 3.4 (L) 01/29/2017   CL 97 01/29/2017   CREATININE 1.14 01/29/2017   BUN 37 (H) 01/29/2017   CO2 33 (H) 01/29/2017   TSH 0.490 08/09/2016   PSA 0.01 (L) 02/27/2017   INR 1.08 11/05/2014   HGBA1C 6.7 (H) 02/27/2017    --------------------------------------------------------------------------------------------------------------------- Dg C-arm 1-60 Min-no Report  Result Date: 03/18/2017 Fluoroscopy was utilized by the requesting physician.  No radiographic interpretation.       ---------------------------------------------------------------------------------------------------------------------- Past Medical History:  Diagnosis Date  . Anxiety   . Anxiety and depression   . Arthritis   . Colon polyps   . Dementia   . Depression   . Diabetes mellitus type 2 in nonobese (HCC)   . GERD (gastroesophageal reflux disease)   . Headache   . 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[place])   . History of hiatal hernia   . Hypercholesteremia   . Hypertension   . Hyperthyroidism   . Neuropathic pain   . Primary localized osteoarthritis of right knee   . Stomach ulcer   . Tremor, essential     Past Surgical History:  Procedure Laterality Date  . COLONOSCOPY WITH PROPOFOL N/A 09/14/2016   Procedure: COLONOSCOPY WITH PROPOFOL;  Surgeon: Manya Silvas, MD;  Location[facility] ENDOSCOPY;  Service: Endoscopy;  Laterality: N/A;  . esophageal stretch    . JOINT REPLACEMENT    . LUMBAR  LAMINECTOMY/DECOMPRESSION MICRODISCECTOMY Left 02/03/2016   Procedure: LEFT L5-S1 DISKECTOMY;  Surgeon: Leeroy Cha, MD;  Location: [place] NEURO ORS;  Service: Neurosurgery;  Laterality: Left;  LEFT L5-S1 DISKECTOMY  . TONSILLECTOMY    . TOTAL KNEE ARTHROPLASTY Right 11/15/2014   Procedure: TOTAL KNEE ARTHROPLASTY;  Surgeon: Elsie Saas, MD;  Location: [place];  Service: Orthopedics;  Laterality: Right;    Family History  Problem Relation Age of Onset  . Heart attack Mother   . Heart disease Mother   . Hypertension Father   . Depression Father   . Kidney cancer Neg Hx   . Prostate cancer Neg Hx     Social History  Substance Use Topics  . Smoking status: Current Every Day Smoker    Packs/day: 1.00    Years: 60.00    Types: Cigarettes    Last attempt to quit: 08/2016  . Smokeless tobacco: Never Used  . 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 lactated ringers 1,000 mL (03/18/17 1358)   PRN Meds:.iopamidol   BP 102/63   Pulse (!) 53   Temp (!) 97.2 F (36.2 C)   Resp 12   Ht 5\' 10"  (1.778 m)   Wt 174 lb (78.9 kg)   SpO2 98%   BMI 24.97 kg/m    BP Readings from Last 3 Encounters:  03/18/17 102/63  02/27/17 120/68  01/29/17 110/60     Wt Readings from Last 3 Encounters:  03/18/17 174 lb (78.9 kg)  02/27/17 176 lb 12.8 oz (80.2 kg)  01/29/17 174 lb 3.2 oz (79 kg)     ----------------------------------------------------------------------------------------------------------------------  ROS Review of Systems  CNS: No sedation or confusion Cardiac: No angina or palpitations GI: No constipation or abdominal pain  Objective:  BP 102/63   Pulse (!) 53   Temp (!) 97.2 F (36.2 C)   Resp 12   Ht 5\' 10"  (1.778 m)   Wt 174 lb (78.9 kg)   SpO2 98%   BMI 24.97 kg/m   Physical  Exam Alert oriented cooperative compliant 3 Cardiac: Regular rate and rhythm without murmur Lungs are clear to auscultation He continues to have a positive straight leg raise on the left side. His lower extremity muscle tone and bulk is at baseline he is ambulating with a mildly antalgic gait. His lumbar scar is well-healed.     Assessment & Plan:   Spyros was seen today for hip pain, knee pain and leg pain.  Diagnoses and all orders for this visit:  Sciatica of left side associated with disorder of lumbar spine -     Lumbar Epidural Injection  Chronic left-sided low back pain with left-sided sciatica -     triamcinolone acetonide (KENALOG-40) injection 40 mg; 1 mL (40 mg total) by Other route once. -     sodium chloride flush (NS) 0.9 % injection 10 mL; 10 mLs by Other route once. -     ropivacaine (PF) 2 mg/mL (0.2%) (NAROPIN) injection 10 mL; 10 mLs by Epidural route once. -     midazolam (VERSED) 5 MG/5ML injection 5 mg; Inject 5 mLs (5 mg total) into the vein once. -     lidocaine (PF) (XYLOCAINE) 1 % injection 5 mL; Inject 5  mLs into the skin once. -     lactated ringers infusion 1,000 mL; Inject 1,000 mLs into the vein continuous. -     iopamidol (ISOVUE-M) 41 % intrathecal injection 20 mL; 20 mLs by Other route once as needed for contrast. -     Lumbar Epidural Injection  Displacement of lumbar intervertebral disc without myelopathy  Facet arthritis of lumbar region (HCC)  Chronic, continuous use of opioids -     ToxASSURE Select 13 (MW), Urine  Other orders -     Discontinue: oxyCODONE-acetaminophen (PERCOCET) 7.5-325 MG tablet; Take 1 tablet by mouth 3 (three) times daily. -     oxyCODONE-acetaminophen (PERCOCET) 7.5-325 MG tablet; Take 1 tablet by mouth 3 (three) times daily.     ----------------------------------------------------------------------------------------------------------------------  Problem List Items Addressed This Visit      Unprioritized    Chronic lumbar pain   Relevant Medications   triamcinolone acetonide (KENALOG-40) injection 40 mg (Completed)   sodium chloride flush (NS) 0.9 % injection 10 mL (Completed)   ropivacaine (PF) 2 mg/mL (0.2%) (NAROPIN) injection 10 mL (Completed)   midazolam (VERSED) 5 MG/5ML injection 5 mg (Completed)   lidocaine (PF) (XYLOCAINE) 1 % injection 5 mL (Completed)   lactated ringers infusion 1,000 mL   iopamidol (ISOVUE-M) 41 % intrathecal injection 20 mL   oxyCODONE-acetaminophen (PERCOCET) 7.5-325 MG tablet    Other Visit Diagnoses    Sciatica of left side associated with disorder of lumbar spine    -  Primary   Relevant Medications   midazolam (VERSED) 5 MG/5ML injection 5 mg (Completed)   Displacement of lumbar intervertebral disc without myelopathy       Facet arthritis of lumbar region (HCC)       Relevant Medications   triamcinolone acetonide (KENALOG-40) injection 40 mg (Completed)   oxyCODONE-acetaminophen (PERCOCET) 7.5-325 MG tablet   Chronic, continuous use of opioids       Relevant Orders   ToxASSURE Select 13 (MW), Urine      ----------------------------------------------------------------------------------------------------------------------  1. Sciatica of left side associated with disorder of lumbar spine We'll proceed with a third caudal epidural in the series. He continues to derive good functional improvement following the injection generally giving him 75% relief lasting approximately 4-5 weeks. He then has recurrence of the same quality characteristic pain going down the posterior lateral left leg. I'm going to refer him to Dr. Holley Raring for evaluation and consider of implantation of a trial dorsal column stimulator. He has failed conservative therapy and remains quite miserable and is considered a nonsurgical candidate. - Lumbar Epidural Injection  2. Chronic left-sided low back pain with left-sided sciatica We'll proceed with a repeat caudal epidural today. The risks  and benefits once again reviewed in full detail and all questions answered - triamcinolone acetonide (KENALOG-40) injection 40 mg; 1 mL (40 mg total) by Other route once. - sodium chloride flush (NS) 0.9 % injection 10 mL; 10 mLs by Other route once. - ropivacaine (PF) 2 mg/mL (0.2%) (NAROPIN) injection 10 mL; 10 mLs by Epidural route once. - midazolam (VERSED) 5 MG/5ML injection 5 mg; Inject 5 mLs (5 mg total) into the vein once. - lidocaine (PF) (XYLOCAINE) 1 % injection 5 mL; Inject 5 mLs into the skin once. - lactated ringers infusion 1,000 mL; Inject 1,000 mLs into the vein continuous. - iopamidol (ISOVUE-M) 41 % intrathecal injection 20 mL; 20 mLs by Other route once as needed for contrast. - Lumbar Epidural Injection  3. Displacement of lumbar intervertebral disc without  myelopathy   4. Facet arthritis of lumbar region ([place])   5. Chronic, continuous use of opioids We'll refill his medications for October 30 November 29 and December 29 for Percocet and he is to return to clinic in 2 months for reevaluation. We'll defer in your any repeat epidural injections for the next 2-3 months. - ToxASSURE Select 13 (MW), Urine    ----------------------------------------------------------------------------------------------------------------------  I am having Mr. Novacek maintain his divalproex, donepezil, aspirin EC, propranolol, finasteride, acetaminophen, metoprolol succinate, omeprazole, memantine, primidone, lisinopril-hydrochlorothiazide, glimepiride, QUEtiapine, sitaGLIPtin, metFORMIN, MELATONIN PO, diphenoxylate-atropine, budesonide, tamsulosin, simvastatin, fenofibrate, buPROPion, gabapentin, and oxyCODONE-acetaminophen. We administered triamcinolone acetonide, sodium chloride flush, ropivacaine (PF) 2 mg/mL (0.2%), midazolam, lidocaine (PF), lactated ringers, and iopamidol.   Meds ordered this encounter  Medications  . triamcinolone acetonide (KENALOG-40) injection 40 mg  . sodium  chloride flush (NS) 0.9 % injection 10 mL  . ropivacaine (PF) 2 mg/mL (0.2%) (NAROPIN) injection 10 mL  . midazolam (VERSED) 5 MG/5ML injection 5 mg  . lidocaine (PF) (XYLOCAINE) 1 % injection 5 mL  . lactated ringers infusion 1,000 mL  . iopamidol (ISOVUE-M) 41 % intrathecal injection 20 mL  . DISCONTD: oxyCODONE-acetaminophen (PERCOCET) 7.5-325 MG tablet    Sig: Take 1 tablet by mouth 3 (three) times daily.    Dispense:  100 tablet    Refill:  0    Dont fill until [id number]  . oxyCODONE-acetaminophen (PERCOCET) 7.5-325 MG tablet    Sig: Take 1 tablet by mouth 3 (three) times daily.    Dispense:  100 tablet    Refill:  0    Dont fill until [id number]    Procedure caudal epidural steroid No. 3 under fluoroscopic guidance with moderate sedation  After informed consent was obtained and the risks benefits reviewed patient chose to pursue a caudal epidural steroid injection today. With the patient in the prone position and an IV in place, sedation was with IV  versed. Using AP and lateral fluoroscopic guidance I identified the area overlying the sacral hiatus. This area was broadly prepped with DuraPrep 3 and we utilized strict aseptic technique during the procedure. 1% lidocaine was infiltrated 2 cc using a 25-gauge needle overlying the sacral cornu and then using lateral fluoroscopic guidance I advanced an 18-gauge Touhy needle approximately 2 cm through the sacral hiatus. Confirmation was with 2 cc of Isovue yielding good epidural spread and no evidence of IV or subarachnoid uptake. This was followed by an injection of 5 cc of saline mixed with 1 cc of ropivacaine 0.2% and 40 mg of triamcinolone. This was tolerated without difficulty the patient was convalesced and discharged home in stable condition for follow-up as mentioned. JA Follow-up: Return for evaluation, med refill.    Molli Barrows, MD 4:41 PM  The [place] practitioner database for opioid medications on this patient has been reviewed by  me and my staff   Greater than 50% of the total encounter time was spent in counseling and / or coordination of care.     This dictation was performed utilizing Systems analyst.  Please excuse any unintentional or mistaken typographical errors as a result.

## 2017-03-18 NOTE — Progress Notes (Signed)
Safety precautions to be maintained throughout the outpatient stay will include: orient to surroundings, keep bed in low position, maintain call bell within reach at all times, provide assistance with transfer out of bed and ambulation.  

## 2017-03-19 ENCOUNTER — Telehealth: Payer: Self-pay | Admitting: *Deleted

## 2017-03-19 ENCOUNTER — Ambulatory Visit: Payer: PPO | Admitting: Anesthesiology

## 2017-03-19 NOTE — Telephone Encounter (Signed)
No problems post procedure. 

## 2017-03-25 DIAGNOSIS — R251 Tremor, unspecified: Secondary | ICD-10-CM | POA: Diagnosis not present

## 2017-03-25 DIAGNOSIS — R413 Other amnesia: Secondary | ICD-10-CM | POA: Diagnosis not present

## 2017-03-25 DIAGNOSIS — R262 Difficulty in walking, not elsewhere classified: Secondary | ICD-10-CM | POA: Diagnosis not present

## 2017-03-25 DIAGNOSIS — G479 Sleep disorder, unspecified: Secondary | ICD-10-CM | POA: Diagnosis not present

## 2017-03-29 ENCOUNTER — Other Ambulatory Visit: Payer: Self-pay | Admitting: Family Medicine

## 2017-04-02 ENCOUNTER — Other Ambulatory Visit: Payer: Self-pay

## 2017-04-02 ENCOUNTER — Ambulatory Visit
Payer: PPO | Attending: Student in an Organized Health Care Education/Training Program | Admitting: Student in an Organized Health Care Education/Training Program

## 2017-04-02 ENCOUNTER — Encounter: Payer: Self-pay | Admitting: Student in an Organized Health Care Education/Training Program

## 2017-04-02 VITALS — BP 121/63 | HR 88 | Temp 97.7°F | Resp 16 | Ht 70.0 in | Wt 172.0 lb

## 2017-04-02 DIAGNOSIS — I129 Hypertensive chronic kidney disease with stage 1 through stage 4 chronic kidney disease, or unspecified chronic kidney disease: Secondary | ICD-10-CM | POA: Insufficient documentation

## 2017-04-02 DIAGNOSIS — M961 Postlaminectomy syndrome, not elsewhere classified: Secondary | ICD-10-CM | POA: Diagnosis not present

## 2017-04-02 DIAGNOSIS — M4804 Spinal stenosis, thoracic region: Secondary | ICD-10-CM | POA: Diagnosis not present

## 2017-04-02 DIAGNOSIS — F17210 Nicotine dependence, cigarettes, uncomplicated: Secondary | ICD-10-CM | POA: Diagnosis not present

## 2017-04-02 DIAGNOSIS — G8929 Other chronic pain: Secondary | ICD-10-CM | POA: Diagnosis not present

## 2017-04-02 DIAGNOSIS — Z79899 Other long term (current) drug therapy: Secondary | ICD-10-CM | POA: Diagnosis not present

## 2017-04-02 DIAGNOSIS — Z9889 Other specified postprocedural states: Secondary | ICD-10-CM | POA: Insufficient documentation

## 2017-04-02 DIAGNOSIS — Z8249 Family history of ischemic heart disease and other diseases of the circulatory system: Secondary | ICD-10-CM | POA: Insufficient documentation

## 2017-04-02 DIAGNOSIS — Z7982 Long term (current) use of aspirin: Secondary | ICD-10-CM | POA: Diagnosis not present

## 2017-04-02 DIAGNOSIS — K219 Gastro-esophageal reflux disease without esophagitis: Secondary | ICD-10-CM | POA: Diagnosis not present

## 2017-04-02 DIAGNOSIS — Z7984 Long term (current) use of oral hypoglycemic drugs: Secondary | ICD-10-CM | POA: Diagnosis not present

## 2017-04-02 DIAGNOSIS — M1711 Unilateral primary osteoarthritis, right knee: Secondary | ICD-10-CM | POA: Insufficient documentation

## 2017-04-02 DIAGNOSIS — E0590 Thyrotoxicosis, unspecified without thyrotoxic crisis or storm: Secondary | ICD-10-CM | POA: Diagnosis not present

## 2017-04-02 DIAGNOSIS — M5417 Radiculopathy, lumbosacral region: Secondary | ICD-10-CM | POA: Diagnosis not present

## 2017-04-02 DIAGNOSIS — K2270 Barrett's esophagus without dysplasia: Secondary | ICD-10-CM | POA: Diagnosis not present

## 2017-04-02 DIAGNOSIS — N189 Chronic kidney disease, unspecified: Secondary | ICD-10-CM | POA: Diagnosis not present

## 2017-04-02 DIAGNOSIS — M931 Kienbock's disease of adults: Secondary | ICD-10-CM | POA: Diagnosis not present

## 2017-04-02 DIAGNOSIS — Z9660 Presence of unspecified orthopedic joint implant: Secondary | ICD-10-CM | POA: Diagnosis not present

## 2017-04-02 DIAGNOSIS — Z818 Family history of other mental and behavioral disorders: Secondary | ICD-10-CM | POA: Insufficient documentation

## 2017-04-02 DIAGNOSIS — M5126 Other intervertebral disc displacement, lumbar region: Secondary | ICD-10-CM | POA: Insufficient documentation

## 2017-04-02 DIAGNOSIS — Z8719 Personal history of other diseases of the digestive system: Secondary | ICD-10-CM | POA: Insufficient documentation

## 2017-04-02 DIAGNOSIS — E1122 Type 2 diabetes mellitus with diabetic chronic kidney disease: Secondary | ICD-10-CM | POA: Insufficient documentation

## 2017-04-02 DIAGNOSIS — M5442 Lumbago with sciatica, left side: Secondary | ICD-10-CM | POA: Diagnosis not present

## 2017-04-02 DIAGNOSIS — E7800 Pure hypercholesterolemia, unspecified: Secondary | ICD-10-CM | POA: Insufficient documentation

## 2017-04-02 DIAGNOSIS — G250 Essential tremor: Secondary | ICD-10-CM | POA: Diagnosis not present

## 2017-04-02 DIAGNOSIS — Z86010 Personal history of colonic polyps: Secondary | ICD-10-CM | POA: Diagnosis not present

## 2017-04-02 NOTE — Patient Instructions (Signed)
1. Today we discussed spinal cord stimulation. 2. Review DVDs and information material about SCS- please come with questions in the future 3. MRI of lumbar spine and thoracic spine in consideration of SCS 4. Referral to  Psychology for SCS evaluation 5. Follow up in 3-4 weeks

## 2017-04-02 NOTE — Progress Notes (Addendum)
Patient's Name: James Hood  MRN: [id number]  Referring Provider: Leone Haven, MD  DOB: [birth year]  PCP: Leone Haven, MD  DOS: 04/02/2017  Note by: [name], MD  Service setting: Ambulatory outpatient  Specialty: Interventional Pain Management  Location: [facility] (AMB) Pain Management Facility  Visit type: Initial Patient Evaluation  Patient type: New Patient   Primary Reason(s) for Visit: Encounter for initial evaluation of one or more chronic problems (new to examiner) potentially causing chronic pain, and posing a threat to normal musculoskeletal function. (Level of risk: High) CC: Ankle Pain (left) and Leg Pain (left)  HPI  James Hood is a 76 y.o. year old, male patient, who comes today to see [place] for the first time for an initial evaluation of his chronic pain. He has Hypertension; Tremor, essential; Primary localized osteoarthritis of right knee; Anxiety and depression; DJD (degenerative joint disease) of knee; Trochanteric bursitis of left hip; Headache; Falls; Barrett's esophagus; Chronic lumbar pain; BPH (benign prostatic hypertrophy); Benign essential tremor; Benign neoplasm of colon; Chronic diarrhea; Type 2 diabetes mellitus ([place]); Difficulty in walking; Facial droop; HLD (hyperlipidemia); Amnesia; Testicular hypofunction; Absence of sensation; Episode of syncope; Hypertriglyceridemia; Anemia; Lumbar herniated disc; Night sweats; Right hip pain; Purpura ([place]); Thyroid nodule; and Fatty liver on their problem list. Today he comes in for evaluation of his Ankle Pain (left) and Leg Pain (left)  Pain Assessment: Location: Left Ankle Radiating: up to hip Onset: More than a month ago Duration: Chronic pain Quality: Aching, Constant Severity: 5 /10 (self-reported pain score)  Note: Reported level is inconsistent with clinical observations. Clinically the patient looks like a 2/10 A 2/10 is viewed as "Mild to Moderate" and described as noticeable and distracting. Impossible  to hide from other people. More frequent flare-ups. Still possible to adapt and function close to normal. It can be very annoying and may have occasional stronger flare-ups. With discipline, patients may get used to it and adapt.       When using our objective Pain Scale, levels between 6 and 10/10 are said to belong in an emergency room, as it progressively worsens from a 6/10, described as severely limiting, requiring emergency care not usually available at an outpatient pain management facility. At a 6/10 level, communication becomes difficult and requires great effort. Assistance to reach the emergency department may be required. Facial flushing and profuse sweating along with potentially dangerous increases in heart rate and blood pressure will be evident. Effect on ADL: doesnt really effect them except sometimes it is unbearable to walk Timing: Constant Modifying factors: medications heat  Onset and Duration: Gradual Cause of pain: Arthritis Severity: Getting worse Timing: Not influenced by the time of the day Aggravating Factors: Lifiting, Prolonged standing, Twisting and Walking downhill Alleviating Factors: Lying down, Medications, Resting and Relaxation therapy Associated Problems: Depression, Numbness, Sadness, Tingling, Weakness and Pain that does not allow patient to sleep Quality of Pain: Sharp, Shooting, Stabbing, Tender and Throbbing Previous Examinations or Tests: MRI scan, Nerve block, X-rays, Nerve conduction test, Neurological evaluation and Neurosurgical evaluation Previous Treatments: Epidural steroid injections, Narcotic medications, Physical Therapy, Steroid treatments by mouth, Strengthening exercises and Stretching exercises  The patient comes into the clinics today for the first time for a chronic pain management evaluation.   76 year old male who presents with progressively worsening left radicular pain.  Patient endorses sharp shooting tingling pain that radiates down  the posterior aspect of his left leg with associated pain along the lateral side as well.  Patient  is under the care of my colleague Dr. Andree Elk.  He is status post a series of 3 caudal epidural steroid injections which were moderately effective for his left radicular symptoms.  He has also seen neurosurgery and is not a neurosurgical candidate.  Patient does not endorse any right-sided pain, 100% in left leg.    Historic Controlled Substance Pharmacotherapy Review  Primary pain med management per Dr. Andree Elk.  Percocet 7.5 mg 3 times daily as needed  Meds   Current Outpatient Medications:  .  acetaminophen (TYLENOL) 500 MG tablet, Take 1,000 mg by mouth as needed. , Disp: , Rfl:  .  aspirin EC 81 MG tablet, Take 1,000 mg 2 (two) times daily by mouth. , Disp: , Rfl:  .  budesonide (ENTOCORT EC) 3 MG 24 hr capsule, Take 3 mg by mouth daily. , Disp: , Rfl:  .  buPROPion (WELLBUTRIN SR) 150 MG 12 hr tablet, TAKE ONE TABLET BY MOUTH TWICE A DAY, Disp: 180 tablet, Rfl: 0 .  diphenoxylate-atropine (LOMOTIL) 2.5-0.025 MG tablet, TAKE ONE TABLET BY MOUTH FOUR TIMES A DAY AS NEEDED FOR DIARRHEA, Disp: , Rfl:  .  divalproex (DEPAKOTE ER) 250 MG 24 hr tablet, Take 250 mg by mouth 2 (two) times daily. Pt takes '250mg'$  in the morning, and '250mg'$  in the afternoon, Disp: , Rfl:  .  donepezil (ARICEPT) 10 MG tablet, Take 10 mg by mouth at bedtime., Disp: , Rfl:  .  fenofibrate (TRICOR) 145 MG tablet, TAKE ONE TABLET BY MOUTH DAILY, Disp: 90 tablet, Rfl: 0 .  finasteride (PROSCAR) 5 MG tablet, Take 1 tablet (5 mg total) by mouth daily., Disp: 30 tablet, Rfl: 11 .  gabapentin (NEURONTIN) 600 MG tablet, TAKE TWO TABLETS BY MOUTH THREE TIMES A DAY, Disp: 180 tablet, Rfl: 1 .  glimepiride (AMARYL) 4 MG tablet, Take 1 tablet (4 mg total) by mouth 2 (two) times daily., Disp: 180 tablet, Rfl: 3 .  lisinopril-hydrochlorothiazide (PRINZIDE,ZESTORETIC) 20-25 MG tablet, Take 1 tablet by mouth 2 (two) times daily., Disp: 180  tablet, Rfl: 2 .  MELATONIN PO, Take 20 mg by mouth at bedtime., Disp: , Rfl:  .  memantine (NAMENDA) 10 MG tablet, Take 10 mg by mouth 2 (two) times daily. , Disp: , Rfl:  .  metFORMIN (GLUCOPHAGE) 500 MG tablet, TAKE TWO TABLETS BY MOUTH TWICE A DAY WITH A MEAL, Disp: 360 tablet, Rfl: 1 .  metoprolol succinate (TOPROL-XL) 25 MG 24 hr tablet, Take 25 mg by mouth daily. , Disp: , Rfl:  .  omeprazole (PRILOSEC) 20 MG capsule, 20 mg 2 (two) times daily before a meal. , Disp: , Rfl:  .  oxyCODONE-acetaminophen (PERCOCET) 7.5-325 MG tablet, Take 1 tablet by mouth 3 (three) times daily., Disp: 100 tablet, Rfl: 0 .  primidone (MYSOLINE) 50 MG tablet, TAKE 5 TABLETS IN THE MORNING AND 3 TABLETS IN THE EVENING, Disp: , Rfl:  .  propranolol (INDERAL) 60 MG tablet, Take 60 mg by mouth 2 (two) times daily. , Disp: , Rfl:  .  QUEtiapine (SEROQUEL) 25 MG tablet, TAKE THREE TO FOUR TABLETS BY MOUTH AT NIGHT, Disp: , Rfl:  .  simvastatin (ZOCOR) 40 MG tablet, TAKE 1 TABLET (40 MG TOTAL) BY MOUTH DAILY., Disp: 90 tablet, Rfl: 0 .  sitaGLIPtin (JANUVIA) 100 MG tablet, Take 1 tablet (100 mg total) by mouth daily., Disp: 90 tablet, Rfl: 3 .  tamsulosin (FLOMAX) 0.4 MG CAPS capsule, Take 1 capsule (0.4 mg total) by mouth daily after  supper., Disp: 90 capsule, Rfl: 3  Imaging Review   Lumbosacral Imaging: Lumbar MR wo contrast:  Results for orders placed during the hospital encounter of 03/29/16  MR Lumbar Spine Wo Contrast   Narrative CLINICAL DATA:  Initial evaluation for chronic low back pain with left-sided sciatica, worsened.  EXAM: MRI LUMBAR SPINE WITHOUT CONTRAST  TECHNIQUE: Multiplanar, multisequence MR imaging of the lumbar spine was performed. No intravenous contrast was administered.  COMPARISON:  Prior MRI from 09/22/2015.  FINDINGS: Segmentation: L normal segmentation. Lowest well-formed disc is labeled the L5-S1 level. Same numbering system is employed as on previous exams.  Alignment:  Straightening with slight reversal of the normal lumbar lordosis, stable. No listhesis.  Vertebrae: Vertebral body heights are maintained. No evidence for acute or chronic fracture. Reactive endplate changes about the left aspect of the L5-S1 interspace, similar to previous.  Postoperative changes from recent decompressive left hemi laminectomy with micro discectomy seen at L5-S1. No concerning features identified.  Conus medullaris: Extends to the L1 level and appears normal.  Paraspinal and other soft tissues: Normal expected postoperative no concerning features identified. Paraspinous soft tissues otherwise within normal limits. Visualized visceral structures are normal. No retroperitoneal adenopathy. Changes related to decompressive laminectomy present within the posterior paraspinous soft tissues of the lower back.  Disc levels:  L1-2: Degenerative disc desiccation with intervertebral disc space narrowing and mild disc bulge. No stenosis.  L2-3: Degenerative disc desiccation with intervertebral disc space narrowing and mild disc bulge. No stenosis.  L3-4: Degenerative disc desiccation with mild diffuse disc bulge. Mild facet hypertrophy. Mild right foraminal stenosis related to disc bulge and facet disease, stable. No canal or left foraminal narrowing.  L4-5: Degenerative disc bulge with intervertebral disc space narrowing and disc desiccation. Disc bulging a centric to the right without focal disc protrusion. Superimposed bilateral facet arthrosis with ligamentum flavum hypertrophy. Reactive effusions within the bilateral L4-5 facets, stable. Minimal bilateral subarticular stenosis, slightly greater on the left. Mild bilateral foraminal narrowing related to disc bulge and facet disease, stable.  L5-S1: Postoperative changes from interval decompressive left hemi laminectomy with micro discectomy. Previously seen left subarticular disc protrusion has been largely  resected, although there appears to be a small residual central disc protrusion that minimally indents the ventral thecal sac (series 6, image 28). Soft tissue density within the left lateral epidural space likely reflects postoperative granulation tissue, although evaluation somewhat limited due to lack of IV contrast. This surrounds the transiting S1 nerve root in the left lateral recess (series 7, image 28). No significant canal stenosis. Superimposed bilateral facet arthrosis with reactive effusions in the bilateral L5-S1 facets is similar to previous. Mild left foraminal narrowing is relatively unchanged. No significant right foraminal stenosis.  IMPRESSION: 1. Postoperative changes from recent decompressive left hemi laminectomy with microdiskectomy at L5-S1. Postoperative granulation tissue within the left epidural space, surrounding the descending left S1 nerve root. No residual stenosis or concerning features identified. 2. 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 No other significant stenosis or evidence for impingement.   Electronically Signed   By: Jeannine Boga M.D.   On: 03/29/2016 13:10     Lumbar DG 2-3 views:  Results for orders placed during the hospital encounter of 02/03/16  DG Lumbar Spine 2-3 Views   Narrative CLINICAL DATA:  L5-S1 diskectomy  EXAM: LUMBAR SPINE - 2-3 VIEW  COMPARISON:  01/12/2016.  MRI 09/22/2015.  FINDINGS: Same numbering scheme used today as on  the previous MRI study.  Portable cross-table lateral film labeled 1 obtained at 1027 hours shows a spinal needle overlying the posterior elements of the lower spine with the tip positioned at the inferior aspect of the L5-S1 facets.  Second film labeled 2 obtained at 1113 hours shows soft tissue retractors posteriorly. Radiopaque surgical probe is positioned with the tip overlying the posterior L5 inferior  endplate.  IMPRESSION: Intraoperative localization.   Electronically Signed   By: Misty Stanley M.D.   On: 02/03/2016 12:03      Complexity Note: Imaging results reviewed. Results shared with Mr. Serviss, using Layman's terms.                         ROS  Cardiovascular History: Daily Aspirin intake and High blood pressure Pulmonary or Respiratory History: Shortness of breath Neurological History: No reported neurological signs or symptoms such as seizures, abnormal skin sensations, urinary and/or fecal incontinence, being born with an abnormal open spine and/or a tethered spinal cord Review of Past Neurological Studies:  Results for orders placed or performed during the hospital encounter of 09/19/16  MR BRAIN W WO CONTRAST   Narrative   CLINICAL DATA:  76 y/o M; fall and head injury 3 months ago persistent double vision.  EXAM: MRI HEAD WITHOUT AND WITH CONTRAST  TECHNIQUE: Multiplanar, multiecho pulse sequences of the brain and surrounding structures were obtained without and with intravenous contrast.  CONTRAST:  20m MULTIHANCE GADOBENATE DIMEGLUMINE 529 MG/ML IV SOLN  COMPARISON:  08/09/2016 CT of the head.  05/27/2015 MRI of the head.  FINDINGS: Brain: No acute infarction, hemorrhage, hydrocephalus, extra-axial collection or mass lesion. No evidence for diffuse axonal injury. Nonspecific T2 FLAIR hyperintense signal abnormality in subcortical and periventricular white matter is compatible with mild chronic microvascular ischemic changes. There is moderate brain parenchymal volume loss. After administration of intravenous contrast there is no abnormal enhancement of the brain.  Vascular: Normal flow voids.  Skull and upper cervical spine: Normal marrow signal.  Sinuses/Orbits: Mild maxillary sinus mucosal thickening. Otherwise there is no abnormal signal of the visualized paranasal sinuses or mastoid air cells. Orbits are unremarkable.  Other:  None.  IMPRESSION: 1. No acute intracranial abnormality identified. No abnormal enhancement of the brain. No findings of diffuse axonal injury. 2. Mild chronic microvascular ischemic changes and moderate parenchymal volume loss of the brain. 3. Mild maxillary sinus disease.   Electronically Signed   By: LKristine GarbeM.D.   On: 09/19/2016 16:52   Results for orders placed or performed during the hospital encounter of 08/09/16  CT HEAD WO CONTRAST   Narrative   CLINICAL DATA:  Altered mental status. Slurred speech. Incontinence.  EXAM: CT HEAD WITHOUT CONTRAST  TECHNIQUE: Contiguous axial images were obtained from the base of the skull through the vertex without intravenous contrast.  COMPARISON:  Brain MR of 05/27/2015.  CT of 04/15/2008.  FINDINGS: Brain: Mild low density in the periventricular white matter likely related to small vessel disease. Mild ventriculomegaly is similar and likely related to cerebral atrophy. No hemorrhage, acute infarct, mass lesion, intra-axial, or extra-axial fluid collection.  Vascular: Intracranial atherosclerosis.  Skull: No significant soft tissue swelling.  No skull fracture.  Sinuses/Orbits: Normal imaged portions of the orbits and globes. Mucosal thickening of bilateral maxillary sinuses and ethmoid air cells. Clear mastoid air cells.  Other: None.  IMPRESSION: 1.  No acute intracranial abnormality. 2.  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 3. Sinus  disease.   Electronically Signed   By: Abigail Miyamoto M.D.   On: 08/09/2016 12:38   Results for orders placed or performed during the hospital encounter of 05/17/15  MR Brain Wo Contrast   Narrative   CLINICAL DATA:  Difficulty walking and right facial droop for 2 years.  EXAM: MRI HEAD WITHOUT CONTRAST  TECHNIQUE: Multiplanar, multiecho pulse sequences of the brain and surrounding structures were obtained without intravenous  contrast.  COMPARISON:  08/12/2012  FINDINGS: Calvarium and upper cervical spine: No focal marrow signal abnormality.  Orbits: No significant findings.  Sinuses and Mastoids: Mucosal thickening focally in the left maxillary sinus with fluid level.  Brain: There is generalized atrophy of the cortex, brainstem, and cerebellum. Cortical atrophy is the most prominent. Midbrain atrophy is notable in the sagittal projection but stable and likely commensurate with the generalized volume loss. No acute or interval infarct, hemorrhage, hydrocephalus, or mass lesion. No major vessel occlusion. There is mild for age white matter hyperintensities attributed to chronic small vessel disease in this patient with history of diabetes and hypertension.  IMPRESSION: 1. No acute or reversible finding. 2. Moderate atrophy that is stable from 2014 and nonspecific. 3. Left maxillary sinusitis with acute features.   Electronically Signed   By: Monte Fantasia M.D.   On: 05/27/2015 16:22    Psychological-Psychiatric History: Depressed Gastrointestinal History: Reflux or heatburn Genitourinary History: No reported renal or genitourinary signs or symptoms such as difficulty voiding or producing urine, peeing blood, non-functioning kidney, kidney stones, difficulty emptying the bladder, difficulty controlling the flow of urine, or chronic kidney disease Hematological History: No reported hematological signs or symptoms such as prolonged bleeding, low or poor functioning platelets, bruising or bleeding easily, hereditary bleeding problems, low energy levels due to low hemoglobin or being anemic Endocrine History: No reported endocrine signs or symptoms such as high or low blood sugar, rapid heart rate due to high thyroid levels, obesity or weight gain due to slow thyroid or thyroid disease Rheumatologic History: Joint aches and or swelling due to excess weight (Osteoarthritis) Musculoskeletal History:  Negative for myasthenia gravis, muscular dystrophy, multiple sclerosis or malignant hyperthermia Work History: Retired  Allergies  Mr. Sia is allergic to no known allergies.  Laboratory Chemistry  Inflammation Markers (CRP: Acute Phase) (ESR: Chronic Phase) Lab Results  Component Value Date   ESRSEDRATE 11 01/29/2017                 Renal Function Markers Lab Results  Component Value Date   BUN 37 (H) 01/29/2017   CREATININE 1.14 01/29/2017   GFRAA >60 08/10/2016   GFRNONAA 59 (L) 08/10/2016                 Hepatic Function Markers Lab Results  Component Value Date   AST 28 01/29/2017   ALT 29 01/29/2017   ALBUMIN 4.3 01/29/2017   ALKPHOS 23 (L) 01/29/2017                 Electrolytes Lab Results  Component Value Date   NA 139 01/29/2017   K 3.4 (L) 01/29/2017   CL 97 01/29/2017   CALCIUM 10.4 01/29/2017                 Neuropathy Markers No results found for: [id number]               Bone Pathology Markers Lab Results  Component Value Date   ALKPHOS 23 (L) 01/29/2017   CALCIUM 10.4 01/29/2017  Rheumatology Markers No results found for: [facility]              Coagulation Parameters Lab Results  Component Value Date   INR 1.08 11/05/2014   LABPROT 14.2 11/05/2014   APTT 34 11/05/2014   PLT 303.0 01/29/2017                 Cardiovascular Markers Lab Results  Component Value Date   CKTOTAL 468 (H) 08/14/2016   HGB 12.3 (L) 01/29/2017   HCT 36.6 (L) 01/29/2017                 CA Markers No results found for: CEA, CA125, LABCA2               Note: Lab results reviewed.  PFSH  Drug: Mr. Commisso  has no drug history on file. Alcohol:  has no alcohol history on file. Tobacco:  reports that he has been smoking cigarettes.  He has a 30.00 pack-year smoking history. he has never used smokeless tobacco. 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 Family: family history includes Depression in his father; Heart attack in his mother; Heart disease in his mother; Hypertension in his father.  Past Surgical History:  Procedure Laterality Date  . esophageal stretch    . JOINT REPLACEMENT    . TONSILLECTOMY     Active Ambulatory Problems    Diagnosis Date Noted  . Hypertension   . Tremor, essential   . Primary localized osteoarthritis of right knee   . Anxiety and depression   . DJD (degenerative joint disease) of knee 11/15/2014  . Trochanteric bursitis of left hip 09/09/2015  . Headache 09/09/2015  . Falls 09/16/2015  . Barrett's esophagus 09/16/2015  . Chronic lumbar pain 09/16/2015  . BPH (benign prostatic hypertrophy) 09/16/2015  . Benign essential tremor 03/10/2015  . Benign neoplasm of colon 08/25/2013  . Chronic diarrhea 11/12/2013  . Type 2 diabetes mellitus ([place]) 10/06/2012  . Difficulty in walking 06/01/2015  . Facial droop 05/03/2015  . HLD (hyperlipidemia) 02/23/2015  . Amnesia 06/01/2015  . 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 Absence of sensation 11/18/2013  . Episode of syncope 11/23/2014  . Hypertriglyceridemia 12/09/2015  . Anemia 12/09/2015  . Lumbar herniated disc 02/03/2016  . Night sweats 06/19/2016  . Right hip pain 08/16/2016  . Purpura ([place]) 01/29/2017  . Thyroid nodule 02/27/2017  . Fatty liver 02/27/2017   Resolved Ambulatory Problems    Diagnosis Date Noted  . Diabetes mellitus type 2 in nonobese (HCC)   . Encounter for general adult medical examination with abnormal findings 09/16/2015  . Controlled type 2 diabetes mellitus without complication ([place]) 59/74/1638  . Clinical depression 01/06/2014  .  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 Current tobacco use 11/18/2013  . Has a tremor 10/02/2015  . Sinusitis, acute maxillary 05/30/2016  . Tobacco abuse counseling 05/30/2016  . Flu 08/09/2016  . Hyponatremia 08/16/2016  . Diarrhea 08/27/2016   Past Medical History:  Diagnosis Date  . Anxiety   . Anxiety and depression   . Arthritis   . Colon polyps   . Dementia   . Depression   . Diabetes mellitus type 2 in nonobese (HCC)   . GERD (gastroesophageal reflux  disease)   . Headache   . 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 History of hiatal hernia   . Hypercholesteremia   . Hypertension   . Hyperthyroidism   . Neuropathic pain   . Primary localized osteoarthritis of right knee   . Stomach ulcer   . Tremor, essential    Constitutional Exam  General appearance: Well nourished, well developed, and well hydrated. In no apparent acute distress Vitals:   04/02/17 1155  BP: 121/63  Pulse: 88  Resp: 16  Temp: 97.7 F (36.5 C)  TempSrc: Oral  SpO2: 97%  Weight: 172 lb (78 kg)  Height: '5\' 10"'$  (1.778 m)   BMI Assessment: Estimated body mass index is 24.68 kg/m as calculated from the following:   Height as of this encounter: '5\' 10"'$  (1.778 m).   Weight as of this encounter: 172 lb (78 kg).  BMI interpretation table: BMI level Category Range association with higher incidence of chronic pain  <18 kg/m2 Underweight   18.5-24.9 kg/m2 Ideal body weight   25-29.9 kg/m2 Overweight Increased incidence by 20%  30-34.9 kg/m2 Obese (Class I) Increased incidence by 68%  35-39.9 kg/m2 Severe obesity (Class II) Increased incidence by 136%  >40 kg/m2 Extreme obesity (Class III) Increased incidence by 254%   BMI Readings from Last 4 Encounters:  04/02/17 24.68 kg/m  03/18/17 24.97 kg/m  02/27/17 26.88 kg/m  01/29/17 26.49 kg/m   Wt Readings from Last 4 Encounters:  04/02/17 172 lb (78 kg)  03/18/17 174 lb (78.9 kg)  02/27/17 176 lb 12.8 oz (80.2 kg)  01/29/17 174 lb 3.2 oz (79 kg)   Psych/Mental status: Alert, oriented x 3 (person, place, & time)       Eyes: PERLA Respiratory: No evidence of acute respiratory distress  Cervical Spine Area Exam  Skin & Axial Inspection: No masses, redness, edema, swelling, or associated skin lesions Alignment: Symmetrical Functional ROM: Unrestricted ROM      Stability: No instability detected Muscle Tone/Strength: Functionally intact. No obvious neuro-muscular anomalies detected. Sensory (Neurological): Unimpaired Palpation: No palpable anomalies              Upper Extremity (UE) Exam    Side: Right upper extremity  Side: Left upper extremity  Skin & Extremity Inspection: Skin color, temperature, and hair growth are WNL. No peripheral edema or cyanosis. No masses, redness, swelling, asymmetry, or associated skin lesions. No contractures.  Skin & Extremity Inspection: Skin color, temperature, and hair growth are WNL. No peripheral edema or cyanosis. No masses, redness, swelling, asymmetry, or associated skin lesions. No contractures.  Functional ROM: Unrestricted ROM          Functional ROM: Unrestricted ROM          Muscle Tone/Strength: Functionally intact. No obvious neuro-muscular anomalies detected.  Muscle Tone/Strength: Functionally intact. No obvious neuro-muscular anomalies detected.  Sensory (Neurological): Unimpaired          Sensory (Neurological): Unimpaired          Palpation: No palpable anomalies              Palpation: No palpable anomalies              Specialized Test(s): Deferred         Specialized Test(s): Deferred          Thoracic Spine Area Exam  Skin & Axial Inspection: No masses, redness, or swelling Alignment: Symmetrical Functional ROM: Unrestricted ROM Stability: No instability detected Muscle Tone/Strength: Functionally intact. No obvious  neuro-muscular anomalies detected. Sensory (Neurological): Unimpaired Muscle strength & Tone: No palpable anomalies  Lumbar Spine Area Exam  Skin & Axial  Inspection: No masses, redness, or swelling Alignment: Symmetrical Functional ROM: Decreased ROM     To the left with lumbar extension Stability: No instability detected Muscle Tone/Strength: Functionally intact. No obvious neuro-muscular anomalies detected. Sensory (Neurological): Unimpaired Palpation: No palpable anomalies       Provocative Tests: Lumbar Hyperextension and rotation test: evaluation deferred today       Lumbar Lateral bending test: Positive ipsilateral radicular pain, on the right. Positive for right-sided foraminal stenosis. Patrick's Maneuver: evaluation deferred today                   Positive straight leg raise test on the left Gait & Posture Assessment  Ambulation: Patient ambulates using a cane Gait: Limited. Using assistive device to ambulate Posture: WNL   Lower Extremity Exam    Side: Right lower extremity  Side: Left lower extremity  Skin & Extremity Inspection: Skin color, temperature, and hair growth are WNL. No peripheral edema or cyanosis. No masses, redness, swelling, asymmetry, or associated skin lesions. No contractures.  Skin & Extremity Inspection: Skin color, temperature, and hair growth are WNL. No peripheral edema or cyanosis. No masses, redness, swelling, asymmetry, or associated skin lesions. No contractures.  Functional ROM: Unrestricted ROM          Functional ROM: Unrestricted ROM          Muscle Tone/Strength: Functionally intact. No obvious neuro-muscular anomalies detected.  Muscle Tone/Strength: Functionally intact. No obvious neuro-muscular anomalies detected.  Sensory (Neurological): Unimpaired  Sensory (Neurological): Unimpaired  Palpation: No palpable anomalies  Palpation: No palpable anomalies  5 out of 5 strength bilateral lower extremity: Plantar flexion, dorsiflexion, knee flexion, knee extension.  Assessment  Primary Diagnosis & Pertinent Problem List: The primary encounter diagnosis was Failed back surgical syndrome. Diagnoses of  Lumbosacral radiculopathy, Chronic left-sided low back pain with left-sided sciatica, Displacement of lumbar intervertebral disc without myelopathy, and Spinal stenosis of thoracic region were also pertinent to this visit.  Visit Diagnosis (New problems to examiner): 1. Failed back surgical syndrome   2. Lumbosacral radiculopathy   3. Chronic left-sided low back pain with left-sided sciatica   4. Displacement of lumbar intervertebral disc without myelopathy   5. Spinal stenosis of thoracic region      Plan of Care (Initial workup plan)   76 year old male with a history of left radicular pain status post left hemilaminectomy and microdiscectomy at L5-S1 with associated postoperative granulation tissue noted in the left epidural space and surrounding the left S1 nerve root as evidenced on most recent lumbar MRI from 2017.  Patient's pain and functional status has progressively deteriorated.  He has only obtained moderate albeit short-term pain relief from a series of 3 caudal epidural steroid injections.  Patient comes today for evaluation of spinal cord stimulation.  I spent extensive period of time discussing the indications and potential benefits that could be obtained from spinal cord stimulation as well as the risks associated with the procedure.  Given that the patient's pain is unilateral and radicular, patient could benefit from spinal cord stimulation.  To get the process started, I will send the patient for a thoracic MRI to rule out thoracic canal stenosis in preparation for spinal cord stimulator trial.  I will also obtain a repeat lumbar MRI to evaluate disease progression specifically looking at the left S1 nerve root and the associated granulation  tissue that was mentioned on the lumbar MRI from 2017.  I will send the patient for spinal cord stimulator evaluation with our psychiatric colleague - Dr. Shea Evans.  Patient also endorses depression and would like to see a psychiatrist for  depression and mood management as well.   I have encouraged the patient to review materials on spinal cord stimulation and bring back questions that we can discuss at the next visit.  If need be, I can place the patient in contact with a spinal cord stimulator device representative to answer any technical questions that he may have about the technology or the materials used.  Plan: -Thoracic MRI to rule out thoracic canal stenosis in anticipation of spinal cord stimulator trial -Repeat lumbar MRI to evaluate left S1 nerve root granulation tissue and interval change -Referral to Dr. Shea Evans for evaluation regarding spinal cord stimulation suitability from a psychosocial standpoint as well as evaluation for depression management. -Informational materials provided to patient from various spinal cord stimulator companies regarding spinal cord stimulation -Follow-up in 4 weeks or earlier to discuss spinal cord stimulation further and potentially schedule SCS trial pending thoracic, lumbar MRI and psych evaluation. -Patient instructed to discontinue ASA 1000 mg  Ordered Lab-work, Procedure(s), Referral(s), & Consult(s): Orders Placed This Encounter  Procedures  . MR LUMBAR SPINE WO CONTRAST  . MR THORACIC SPINE WO CONTRAST  . Ambulatory referral to Psychiatry     Provider-requested follow-up: No Follow-up on file.  Future Appointments  Date Time Provider Alpha  05/17/2017 12:30 PM Molli Barrows, MD [facility] None  11/15/2017  4:00 PM Dia Crawford, LPN [facility]  5/[id number]/2091  9:30 AM [facility]    Primary Care Physician: Leone Haven, MD Location: [facility] Note by: [name], M.D, Date: 04/02/2017; Time: 2:21 PM  Patient Instructions  1. Today we discussed spinal cord stimulation. 2. Review DVDs and information material about SCS- please come with questions in the future 3. MRI of lumbar spine and  thoracic spine in consideration of SCS 4. Referral to  Psychology for SCS evaluation 5. Follow up in 3-4 weeks

## 2017-04-08 ENCOUNTER — Telehealth: Payer: Self-pay | Admitting: Student in an Organized Health Care Education/Training Program

## 2017-04-08 NOTE — Telephone Encounter (Signed)
Patient received phone call from Dr. Charlcie Cradle office to schedule and when he told them why he needed appt. They informed him they did not do this type of consult. He has appt with Dr. Holley Raring on Dec and needs to have this done. Please let patient know what to do. Do we need to call Dr. Charlcie Cradle office to schedule ?

## 2017-04-15 ENCOUNTER — Other Ambulatory Visit: Payer: Self-pay | Admitting: Student in an Organized Health Care Education/Training Program

## 2017-04-15 DIAGNOSIS — G894 Chronic pain syndrome: Secondary | ICD-10-CM

## 2017-04-15 NOTE — Telephone Encounter (Signed)
Dr. Holley Raring this patient is one you wanted scheduled for SCS Trial with Dr. Andres Shad office. We are not sure where to schedule him for the type of eval you want. Please assist.

## 2017-04-19 ENCOUNTER — Ambulatory Visit
Admission: RE | Admit: 2017-04-19 | Discharge: 2017-04-19 | Disposition: A | Discharge status: Home / Self Care | Payer: PPO | Source: Ambulatory Visit | Attending: Student in an Organized Health Care Education/Training Program | Admitting: Student in an Organized Health Care Education/Training Program

## 2017-04-19 DIAGNOSIS — M961 Postlaminectomy syndrome, not elsewhere classified: Secondary | ICD-10-CM | POA: Diagnosis not present

## 2017-04-19 DIAGNOSIS — M5127 Other intervertebral disc displacement, lumbosacral region: Secondary | ICD-10-CM | POA: Diagnosis not present

## 2017-04-19 DIAGNOSIS — M48061 Spinal stenosis, lumbar region without neurogenic claudication: Secondary | ICD-10-CM | POA: Diagnosis not present

## 2017-04-30 ENCOUNTER — Ambulatory Visit: Payer: PPO | Admitting: Student in an Organized Health Care Education/Training Program

## 2017-05-01 ENCOUNTER — Other Ambulatory Visit: Payer: Self-pay | Admitting: Family Medicine

## 2017-05-07 ENCOUNTER — Other Ambulatory Visit: Payer: Self-pay | Admitting: Family Medicine

## 2017-05-08 ENCOUNTER — Ambulatory Visit: Payer: PPO | Admitting: Student in an Organized Health Care Education/Training Program

## 2017-05-16 ENCOUNTER — Other Ambulatory Visit: Payer: Self-pay | Admitting: Internal Medicine

## 2017-05-17 ENCOUNTER — Encounter: Payer: PPO | Admitting: Anesthesiology

## 2017-05-22 DIAGNOSIS — F0634 Mood disorder due to known physiological condition with mixed features: Secondary | ICD-10-CM | POA: Diagnosis not present

## 2017-05-23 ENCOUNTER — Ambulatory Visit: Payer: Self-pay | Admitting: *Deleted

## 2017-05-23 NOTE — Telephone Encounter (Signed)
Pt reports productive cough for approximately 4 weeks.Cough is intermittent but "severe" when occurs. Secretions are thick yellow. Intermittent wheezes per pts report, inspiratory and expiratory. Pt is a smoker and has continued to do so. Denies any SOB but "hasn't done much." Some nasal drainage "thick yellow."  Has not checked temp but did have chills several nights ago. Pt will only see provider within [place] office. Appt made for Monday 05/27/17 with James Pinch, PA.  Pt also declines UC ... "Only if absolutely necessary."  Instructed to stop smoking, use humidifier, OTC expectorants and suppressants, monitor temperature and to call back if symptoms worsens. Advised to go to UC if wheezing increases or SOB occurs.  Reason for Disposition . Wheezing is present  Answer Assessment - Initial Assessment Questions 1. ONSET: "When did the cough begin?"      "About 4 weeks ago." 2. SEVERITY: "How bad is the cough today?"      Intermittent but severe when coughs 3. RESPIRATORY DISTRESS: "Describe your breathing."      Wheezing, inspiratory and expiratory. 4. FEVER: "Do you have a fever?" If so, ask: "What is your temperature, how was it measured, and when did it start?"     "Haven't checked but had chills the other night." 5. SPUTUM: "Describe the color of your sputum" (clear, white, yellow, green)     Yellow, thick 6. HEMOPTYSIS: "Are you coughing up any blood?" If so ask: "How much?" (flecks, streaks, tablespoons, etc.)     No 7. CARDIAC HISTORY: "Do you have any history of heart disease?" (e.g., heart attack, congestive heart failure)      No 8. LUNG HISTORY: "Do you have any history of lung disease?"  (e.g., pulmonary embolus, asthma, emphysema)     No 9. PE RISK FACTORS: "Do you have a history of blood clots?" (or: recent major surgery, recent prolonged travel, bedridden )     No 10. OTHER SYMPTOMS: "Do you have any other symptoms?" (e.g., runny nose, wheezing, chest pain)  Wheezing, nasal drainage thick yellow  12. TRAVEL: "Have you traveled out of the country in the last month?" (e.g., travel history, exposures)       no  Protocols used: [place]

## 2017-05-27 ENCOUNTER — Ambulatory Visit (INDEPENDENT_AMBULATORY_CARE_PROVIDER_SITE_OTHER): Payer: PPO | Admitting: Family Medicine

## 2017-05-27 ENCOUNTER — Encounter: Payer: Self-pay | Admitting: Family Medicine

## 2017-05-27 VITALS — BP 118/70 | HR 72 | Temp 97.8°F | Resp 16 | Wt 177.6 lb

## 2017-05-27 DIAGNOSIS — R05 Cough: Secondary | ICD-10-CM

## 2017-05-27 DIAGNOSIS — J0100 Acute maxillary sinusitis, unspecified: Secondary | ICD-10-CM

## 2017-05-27 DIAGNOSIS — R059 Cough, unspecified: Secondary | ICD-10-CM

## 2017-05-27 MED ORDER — BENZONATATE 100 MG PO CAPS: 100.0000 mg | ORAL_CAPSULE | Freq: Three times a day (TID) | ORAL | 0 refills | Status: DC

## 2017-05-27 MED ORDER — DOXYCYCLINE HYCLATE 100 MG PO TABS: 100.0000 mg | ORAL_TABLET | Freq: Two times a day (BID) | ORAL | 0 refills | Status: DC

## 2017-05-27 NOTE — Progress Notes (Signed)
Subjective:    Patient ID: James Hood, male    DOB: 04/02/1941, 77 y.o.   MRN: [id number]  HPI  Mr. Stumph is a 77 year old male who presents today with a cough that has been present for 4 weeks. He has a history of  has a past medical history of Anxiety, Anxiety and depression, Arthritis, Colon polyps, Dementia, Depression, Diabetes mellitus type 2 in nonobese ([place]), GERD (gastroesophageal reflux disease), Headache, 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[place]), History of hiatal hernia, Hypercholesteremia, Hypertension, Hyperthyroidism, Neuropathic pain, Primary localized osteoarthritis of right knee, Stomach ulcer, and Tremor, essential. He has been evaluated for light sweating at night by CT which did not reveal a cause. He was offered a referral to hematology by his PCP for further evaluation which patient deferred. Weight loss was noted as stabilized with improvement. History of depression was noted as a potential cause.   Onset: 4 weeks ago  Trigger: nasal congestion, rhinitis, and post nasal drip  Course: Symptoms are not resolving but are not worsening.   Treatment/efficacy:  Mucinex DM, Mucinex D have provided limited benefit Influenza vaccine is UTD  Associated symptoms: Sinus pressure, post nasal drip,  Fever/Chills/Sweats: No Facial Pain: No Nasal congestion/purulence: Yes Dental pain: No Sore throat: No Ear pain/pressure:No Itchy/watery eyes:No Cough: Yes Pleuritic pain: No Dyspnea: No Wheezing: No  ACE Inhibitor: Yes, he has been on this medication for many years  History of asthma/bronchitis/COPD: No Recent sick contact exposure: No Recent antibiotic use: No GI symptoms of dyspepsia, dysphagia, or reflux: No  He is a current smoker. He smokes 0.5 ppd.   Review of Systems  Constitutional: Negative for chills, fatigue and fever.  HENT: Positive for congestion, postnasal drip, sinus pressure and sinus pain. Negative for sore throat.   Respiratory: Positive for cough.  Negative for shortness of breath and wheezing.   Cardiovascular: Negative for chest pain and palpitations.  Musculoskeletal: Negative for myalgias.  Skin: Negative for rash.  Neurological: Negative for dizziness, weakness, light-headedness and headaches.   Past Medical History:  Diagnosis Date  . Anxiety   . Anxiety and depression   . Arthritis   . Colon polyps   . Dementia   . Depression   . Diabetes mellitus type 2 in nonobese ([facility])   . GERD (gastroesophageal reflux disease)   . Headache   . 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[place])   . History of hiatal hernia   . Hypercholesteremia   . Hypertension   . Hyperthyroidism   . Neuropathic pain   . Primary localized osteoarthritis of right knee   . Stomach ulcer   . Tremor, essential      Social History   Socioeconomic History  . Marital status: Married    Spouse name: Not on file  . Number of children: Not on file  . Years of education: Not on file  . Highest education level: Not on file  Social Needs  . Financial resource strain: Not on file  . Food insecurity - worry: Not on file  . Food insecurity - inability: Not on file  . Transportation needs - medical: Not on file  . Transportation needs - non-medical: Not on file  Occupational History  . Not on file  Tobacco Use  . Smoking status: Current Every Day Smoker    Packs/day: 0.50    Years: 60.00    Pack years: 30.00    Types: Cigarettes    Last attempt to quit: 08/2016    Years since quitting:  0.7  . Smokeless tobacco: Never Used  Substance and Sexual Activity  . Alcohol use: Not on file    Comment: recovery alcoholic 30 yrs sober  . Drug use: Not on file  . Sexual activity: Not Currently  Other Topics Concern  . Not on file  Social History Narrative  . Not on file    Past Surgical History:  Procedure Laterality Date  . COLONOSCOPY WITH PROPOFOL N/A 09/14/2016   Procedure: COLONOSCOPY WITH PROPOFOL;  Surgeon: Manya Silvas, MD;  Location[facility] END[facility]COPY;   Service: Endoscopy;  Laterality: N/A;  . esophageal stretch    . JOINT REPLACEMENT    . LUMBAR LAMINECTOMY/DECOMPRESSION MICRODISCECTOMY Left 02/03/2016   Procedure: LEFT L5-S1 DISKECTOMY;  Surgeon: Leeroy Cha, MD;  Location: [place] NEURO ORS;  Service: Neurosurgery;  Laterality: Left;  LEFT L5-S1 DISKECTOMY  . TONSILLECTOMY    . TOTAL KNEE ARTHROPLASTY Right 11/15/2014   Procedure: TOTAL KNEE ARTHROPLASTY;  Surgeon: Elsie Saas, MD;  Location: [facility];  Service: Orthopedics;  Laterality: Right;    Family History  Problem Relation Age of Onset  . Heart attack Mother   . Heart disease Mother   . Hypertension Father   . Depression Father   . Kidney cancer Neg Hx   . Prostate cancer Neg Hx     Allergies  Allergen Reactions  . No Known Allergies     Current Outpatient Medications on File Prior to Visit  Medication Sig Dispense Refill  . acetaminophen (TYLENOL) 500 MG tablet Take 1,000 mg by mouth as needed.     Marland Kitchen aspirin 500 MG tablet Take 1,000 mg 2 (two) times daily by mouth.    Marland Kitchen aspirin EC 81 MG tablet Take 1,000 mg 2 (two) times daily by mouth.     . budesonide (ENTOCORT EC) 3 MG 24 hr capsule Take 3 mg by mouth daily.     Marland Kitchen buPROPion (WELLBUTRIN SR) 150 MG 12 hr tablet TAKE ONE TABLET BY MOUTH TWICE A DAY 180 tablet 0  . diphenoxylate-atropine (LOMOTIL) 2.5-0.025 MG tablet TAKE ONE TABLET BY MOUTH FOUR TIMES A DAY AS NEEDED FOR DIARRHEA    . divalproex (DEPAKOTE ER) 250 MG 24 hr tablet Take 250 mg by mouth 2 (two) times daily. Pt takes 250mg  in the morning, and 250mg  in the afternoon    . donepezil (ARICEPT) 10 MG tablet Take 10 mg by mouth at bedtime.    . fenofibrate (TRICOR) 145 MG tablet TAKE ONE TABLET BY MOUTH DAILY 90 tablet 0  . finasteride (PROSCAR) 5 MG tablet Take 1 tablet (5 mg total) by mouth daily. 30 tablet 11  . gabapentin (NEURONTIN) 600 MG tablet TAKE TWO TABLETS BY MOUTH THREE TIMES A DAY 180 tablet 0  . glimepiride (AMARYL) 4 MG tablet Take 1 tablet (4 mg  total) by mouth 2 (two) times daily. 180 tablet 3  . lisinopril-hydrochlorothiazide (PRINZIDE,ZESTORETIC) 20-25 MG tablet TAKE ONE TABLET BY MOUTH TWICE A DAY 180 tablet 1  . MELATONIN PO Take 20 mg by mouth at bedtime.    . memantine (NAMENDA) 10 MG tablet Take 10 mg by mouth 2 (two) times daily.     . metFORMIN (GLUCOPHAGE) 500 MG tablet TAKE TWO TABLETS BY MOUTH TWICE A DAY WITH A MEAL 360 tablet 1  . metoprolol succinate (TOPROL-XL) 25 MG 24 hr tablet Take 25 mg by mouth daily.     Marland Kitchen omeprazole (PRILOSEC) 20 MG capsule 20 mg 2 (two) times daily before a meal.     .  oxyCODONE-acetaminophen (PERCOCET) 7.5-325 MG tablet Take 1 tablet by mouth 3 (three) times daily. 100 tablet 0  . primidone (MYSOLINE) 50 MG tablet TAKE 5 TABLETS IN THE MORNING AND 3 TABLETS IN THE EVENING    . propranolol (INDERAL) 60 MG tablet Take 60 mg by mouth 2 (two) times daily.     . QUEtiapine (SEROQUEL) 25 MG tablet TAKE THREE TO FOUR TABLETS BY MOUTH AT NIGHT    . simvastatin (ZOCOR) 40 MG tablet TAKE 1 TABLET (40 MG TOTAL) BY MOUTH DAILY. 90 tablet 0  . sitaGLIPtin (JANUVIA) 100 MG tablet Take 1 tablet (100 mg total) by mouth daily. 90 tablet 3  . tamsulosin (FLOMAX) 0.4 MG CAPS capsule Take 1 capsule (0.4 mg total) by mouth daily after supper. 90 capsule 3   No current facility-administered medications on file prior to visit.     BP 118/70 (BP Location: Left Arm, Patient Position: Sitting, Cuff Size: Normal)   Pulse 72   Temp 97.8 F (36.6 C) (Oral)   Resp 16   Wt 177 lb 9.6 oz (80.6 kg)   SpO2 94%   BMI 25.48 kg/m       Objective:   Physical Exam  Constitutional: He is oriented to person, place, and time. He appears well-developed and well-nourished.  HENT:  Right Ear: Tympanic membrane normal.  Left Ear: Tympanic membrane normal.  Nose: Rhinorrhea present. Right sinus exhibits maxillary sinus tenderness. Right sinus exhibits no frontal sinus tenderness. Left sinus exhibits maxillary sinus  tenderness. Left sinus exhibits no frontal sinus tenderness.  Mouth/Throat: Mucous membranes are normal. No posterior oropharyngeal edema.  Missing teeth, post nasal drip present, clearing throat during exam, mild erythema in pharynx, no exudate or edema present  Eyes: Pupils are equal, round, and reactive to light. No scleral icterus.  Neck: Neck supple.  Cardiovascular: Normal rate and regular rhythm.  Pulmonary/Chest: Effort normal and breath sounds normal. He has no wheezes. He has no rales.  Abdominal: Soft. Bowel sounds are normal. There is no tenderness. There is no rebound.  Lymphadenopathy:    He has no cervical adenopathy.  Neurological: He is alert and oriented to person, place, and time. Coordination normal.  Skin: Skin is warm and dry.  Psychiatric: He has a normal mood and affect. His behavior is normal.       Assessment & Plan:  1. Acute maxillary sinusitis, recurrence not specified  Exam is reassuring; afebrile, nontoxic. Duration of sinus pressure/pain and post nasal drip. Will initiate empiric treatment for sinusitis and advised close follow up if symptoms do not improve, worsen, or new symptoms develop particularly SOB or fever. Doxycycline chosen over Augmentin as patient is elderly and has a history of diarrhea that is concerning with Augmentin.  Advised patient on supportive measures:  Get rest, drink plenty of fluids, and follow up if fever >101, if symptoms worsen or if symptoms are not improving with treatment. Patient verbalizes understanding.   2. Cough Exam is reassuring; current smoker with a 30 pack year history. appears to be triggered by post nasal drip. We discussed that if symptoms do not improve with treatment, will consider imaging. He is not interested in imaging today as he states that he had a chest/abdominal CT less than 5 months ago. Further advised tobacco cessation. Patient is not interested in cessation at this time.   Delano Metz, FNP-C

## 2017-05-27 NOTE — Patient Instructions (Signed)
Please take antibiotic as directed and follow up with provider if symptoms do not improve, worsen, or new symptoms develop.  Please drink plenty of water so that your urine is pale yellow or clear. Also, get plenty of rest, and  follow up if symptoms do not improve in 3 to 4 days, worsen, or you develop a fever >101.   Sinusitis, Adult Sinusitis is soreness and inflammation of your sinuses. Sinuses are hollow spaces in the bones around your face. They are located:  Around your eyes.  In the middle of your forehead.  Behind your nose.  In your cheekbones.  Your sinuses and nasal passages are lined with a stringy fluid (mucus). Mucus normally drains out of your sinuses. When your nasal tissues get inflamed or swollen, the mucus can get trapped or blocked so air cannot flow through your sinuses. This lets bacteria, viruses, and funguses grow, and that leads to infection. Follow these instructions at home: Medicines  Take, use, or apply over-the-counter and prescription medicines only as told by your doctor. These may include nasal sprays.  If you were prescribed an antibiotic medicine, take it as told by your doctor. Do not stop taking the antibiotic even if you start to feel better. Hydrate and Humidify  Drink enough water to keep your pee (urine) clear or pale yellow.  Use a cool mist humidifier to keep the humidity level in your home above 50%.  Breathe in steam for 10-15 minutes, 3-4 times a day or as told by your doctor. You can do this in the bathroom while a hot shower is running.  Try not to spend time in cool or dry air. Rest  Rest as much as possible.  Sleep with your head raised (elevated).  Make sure to get enough sleep each night. General instructions  Put a warm, moist washcloth on your face 3-4 times a day or as told by your doctor. This will help with discomfort.  Wash your hands often with soap and water. If there is no soap and water, use hand sanitizer.  Do  not smoke. Avoid being around people who are smoking (secondhand smoke).  Keep all follow-up visits as told by your doctor. This is important. Contact a doctor if:  You have a fever.  Your symptoms get worse.  Your symptoms do not get better within 10 days. Get help right away if:  You have a very bad headache.  You cannot stop throwing up (vomiting).  You have pain or swelling around your face or eyes.  You have trouble seeing.  You feel confused.  Your neck is stiff.  You have trouble breathing. This information is not intended to replace advice given to you by your health care provider. Make sure you discuss any questions you have with your health care provider. Document Released: 10/24/2007 Document Revised: 01/01/2016 Document Reviewed: 03/02/2015 Elsevier Interactive Patient Education  [employer].

## 2017-05-29 DIAGNOSIS — R197 Diarrhea, unspecified: Secondary | ICD-10-CM | POA: Diagnosis not present

## 2017-05-29 DIAGNOSIS — K219 Gastro-esophageal reflux disease without esophagitis: Secondary | ICD-10-CM | POA: Diagnosis not present

## 2017-06-01 ENCOUNTER — Other Ambulatory Visit: Payer: Self-pay | Admitting: Family Medicine

## 2017-06-04 ENCOUNTER — Telehealth: Payer: Self-pay | Admitting: Pharmacist

## 2017-06-04 NOTE — Patient Outreach (Signed)
[facility]) Care Management  06/04/2017  BEVIN MAYALL Jan 28, 1941 [id number]   77 y.o. year old male referred to [place] for Medication Assistance (Pharmacy telephone outreach) Patient was involved with [facility] previously with Karrie Meres, PharmD. Calling patient to offer patient assistance with [facility]iance.   PMH s/f: HTN, diabetes, fatty liver, barrett's esophagus, tremor, osteoarthritis, BPH, anxiety and depression, hyperlipidemia, h/o falls.   Patient with [employer] medicare advantage plan - NOT in donut hole.    Patient confirms identity with HIPAA-identifiers x2 and gives verbal consent to speak over the phone about medications. Offered patient assistance application help for Januvia - patient agrees and expresses thanks.     #Medication Assistance - patient with [employer] Medicare Advantage plan that is not in donut hole and is interested in applying for medication assistance through manufacturer programs for the following medications: Januvia - Appointment made to see patient at Dr. Ellen Henri office on 06/10/17 at [place] to complete application materials. Below still required to complete application: PATIENT:  [ ]  Completed and signed application [name] ] Financial information [ ]  Insurance card information  PROVIDER::  Rx information -  [name] ] Januvia Signed application - [name] ] [place], Pharm.D., BCPS PGY2 Ambulatory Care Pharmacy Resident Phone: [phone]

## 2017-06-05 ENCOUNTER — Encounter: Payer: Self-pay | Admitting: Student in an Organized Health Care Education/Training Program

## 2017-06-05 ENCOUNTER — Ambulatory Visit
Payer: PPO | Attending: Student in an Organized Health Care Education/Training Program | Admitting: Student in an Organized Health Care Education/Training Program

## 2017-06-05 VITALS — BP 122/66 | HR 71 | Temp 98.0°F | Resp 16 | Ht 70.5 in | Wt 174.0 lb

## 2017-06-05 DIAGNOSIS — M1711 Unilateral primary osteoarthritis, right knee: Secondary | ICD-10-CM | POA: Insufficient documentation

## 2017-06-05 DIAGNOSIS — I10 Essential (primary) hypertension: Secondary | ICD-10-CM | POA: Insufficient documentation

## 2017-06-05 DIAGNOSIS — E781 Pure hyperglyceridemia: Secondary | ICD-10-CM | POA: Insufficient documentation

## 2017-06-05 DIAGNOSIS — M5417 Radiculopathy, lumbosacral region: Secondary | ICD-10-CM

## 2017-06-05 DIAGNOSIS — M7062 Trochanteric bursitis, left hip: Secondary | ICD-10-CM | POA: Diagnosis not present

## 2017-06-05 DIAGNOSIS — Z87891 Personal history of nicotine dependence: Secondary | ICD-10-CM | POA: Insufficient documentation

## 2017-06-05 DIAGNOSIS — E291 Testicular hypofunction: Secondary | ICD-10-CM | POA: Insufficient documentation

## 2017-06-05 DIAGNOSIS — E119 Type 2 diabetes mellitus without complications: Secondary | ICD-10-CM | POA: Insufficient documentation

## 2017-06-05 DIAGNOSIS — Z8719 Personal history of other diseases of the digestive system: Secondary | ICD-10-CM | POA: Insufficient documentation

## 2017-06-05 DIAGNOSIS — M5117 Intervertebral disc disorders with radiculopathy, lumbosacral region: Secondary | ICD-10-CM | POA: Diagnosis not present

## 2017-06-05 DIAGNOSIS — M545 Low back pain: Secondary | ICD-10-CM | POA: Diagnosis not present

## 2017-06-05 DIAGNOSIS — F0390 Unspecified dementia without behavioral disturbance: Secondary | ICD-10-CM | POA: Diagnosis not present

## 2017-06-05 DIAGNOSIS — M25551 Pain in right hip: Secondary | ICD-10-CM | POA: Insufficient documentation

## 2017-06-05 DIAGNOSIS — D649 Anemia, unspecified: Secondary | ICD-10-CM | POA: Diagnosis not present

## 2017-06-05 DIAGNOSIS — M48061 Spinal stenosis, lumbar region without neurogenic claudication: Secondary | ICD-10-CM | POA: Diagnosis not present

## 2017-06-05 DIAGNOSIS — Z79891 Long term (current) use of opiate analgesic: Secondary | ICD-10-CM | POA: Diagnosis not present

## 2017-06-05 DIAGNOSIS — F329 Major depressive disorder, single episode, unspecified: Secondary | ICD-10-CM | POA: Diagnosis not present

## 2017-06-05 DIAGNOSIS — E041 Nontoxic single thyroid nodule: Secondary | ICD-10-CM | POA: Insufficient documentation

## 2017-06-05 DIAGNOSIS — M961 Postlaminectomy syndrome, not elsewhere classified: Secondary | ICD-10-CM

## 2017-06-05 DIAGNOSIS — R51 Headache: Secondary | ICD-10-CM | POA: Insufficient documentation

## 2017-06-05 DIAGNOSIS — G8929 Other chronic pain: Secondary | ICD-10-CM | POA: Diagnosis not present

## 2017-06-05 DIAGNOSIS — M25552 Pain in left hip: Secondary | ICD-10-CM | POA: Diagnosis not present

## 2017-06-05 DIAGNOSIS — Z86010 Personal history of colonic polyps: Secondary | ICD-10-CM | POA: Insufficient documentation

## 2017-06-05 DIAGNOSIS — M5442 Lumbago with sciatica, left side: Secondary | ICD-10-CM

## 2017-06-05 DIAGNOSIS — E7800 Pure hypercholesterolemia, unspecified: Secondary | ICD-10-CM | POA: Insufficient documentation

## 2017-06-05 DIAGNOSIS — N400 Enlarged prostate without lower urinary tract symptoms: Secondary | ICD-10-CM | POA: Diagnosis not present

## 2017-06-05 DIAGNOSIS — Z7982 Long term (current) use of aspirin: Secondary | ICD-10-CM | POA: Insufficient documentation

## 2017-06-05 DIAGNOSIS — K529 Noninfective gastroenteritis and colitis, unspecified: Secondary | ICD-10-CM | POA: Diagnosis not present

## 2017-06-05 DIAGNOSIS — F1190 Opioid use, unspecified, uncomplicated: Secondary | ICD-10-CM

## 2017-06-05 DIAGNOSIS — M25572 Pain in left ankle and joints of left foot: Secondary | ICD-10-CM | POA: Insufficient documentation

## 2017-06-05 DIAGNOSIS — K2270 Barrett's esophagus without dysplasia: Secondary | ICD-10-CM | POA: Diagnosis not present

## 2017-06-05 DIAGNOSIS — Z79899 Other long term (current) drug therapy: Secondary | ICD-10-CM | POA: Insufficient documentation

## 2017-06-05 DIAGNOSIS — G894 Chronic pain syndrome: Secondary | ICD-10-CM | POA: Insufficient documentation

## 2017-06-05 DIAGNOSIS — M5126 Other intervertebral disc displacement, lumbar region: Secondary | ICD-10-CM | POA: Diagnosis not present

## 2017-06-05 DIAGNOSIS — F419 Anxiety disorder, unspecified: Secondary | ICD-10-CM | POA: Diagnosis not present

## 2017-06-05 DIAGNOSIS — K219 Gastro-esophageal reflux disease without esophagitis: Secondary | ICD-10-CM | POA: Insufficient documentation

## 2017-06-05 DIAGNOSIS — E0590 Thyrotoxicosis, unspecified without thyrotoxic crisis or storm: Secondary | ICD-10-CM | POA: Insufficient documentation

## 2017-06-05 DIAGNOSIS — Z7984 Long term (current) use of oral hypoglycemic drugs: Secondary | ICD-10-CM | POA: Insufficient documentation

## 2017-06-05 MED ORDER — OXYCODONE-ACETAMINOPHEN 7.5-325 MG PO TABS: 1.0000 | ORAL_TABLET | Freq: Three times a day (TID) | ORAL | 0 refills | Status: DC

## 2017-06-05 NOTE — Progress Notes (Signed)
Patient's Name: James Hood  MRN: [id number]  Referring Provider: Leone Haven, MD  DOB: 1941-03-17  PCP: Leone Haven, MD  DOS: 06/05/2017  Note by: [name], MD  Service setting: Ambulatory outpatient  Specialty: Interventional Pain Management  Location: [facility] (AMB) Pain Management Facility    Patient type: Established   Primary Reason(s) for Visit: Encounter for prescription drug management. (Level of risk: moderate)  CC: Hip Pain (left) and Ankle Pain (left)  HPI  James Hood is a 77 y.o. year old, male patient, who comes today for a medication management evaluation. He has Hypertension; Tremor, essential; Primary localized osteoarthritis of right knee; Anxiety and depression; DJD (degenerative joint disease) of knee; Trochanteric bursitis of left hip; Headache; Falls; Barrett's esophagus; Chronic lumbar pain; BPH (benign prostatic hypertrophy); Benign essential tremor; Benign neoplasm of colon; Chronic diarrhea; Type 2 diabetes mellitus ([place]); Difficulty in walking; Facial droop; HLD (hyperlipidemia); Amnesia; Testicular hypofunction; Absence of sensation; Episode of syncope; Hypertriglyceridemia; Anemia; Lumbar herniated disc; Night sweats; Right hip pain; Purpura ([place]); Thyroid nodule; and Fatty liver on their problem list. His primarily concern today is the Hip Pain (left) and Ankle Pain (left)  Pain Assessment: Location: (left, always hurts) (ankle) Radiating: into left leg Onset: More than a month ago Duration: Chronic pain Quality: Constant, Discomfort, Aching Severity: 7 /10 (self-reported pain score)  Note: Reported level is inconsistent with clinical observations. Clinically the patient looks like a 2/10             When using our objective Pain Scale, levels between 6 and 10/10 are said to belong in an emergency room, as it progressively worsens from a 6/10, described as severely limiting, requiring emergency care not usually available at an outpatient pain  management facility. At a 6/10 level, communication becomes difficult and requires great effort. Assistance to reach the emergency department may be required. Facial flushing and profuse sweating along with potentially dangerous increases in heart rate and blood pressure will be evident. Effect on ADL: in pain every day all day long.  Timing: Constant Modifying factors: pain medications, asa, ibuprofen  77 year old male who presents with progressively worsening left radicular pain.  Patient endorses sharp shooting tingling pain that radiates down the posterior aspect of his left leg with associated pain along the lateral side as well.  Patient is under the care of my colleague Dr. Andree Elk.  He is status post a series of 3 caudal epidural steroid injections which were moderately effective for his left radicular symptoms.  He has also seen neurosurgery and is not a neurosurgical candidate.  Patient does not endorse any right-sided pain, 100% in left leg.  He is being evaluated for spinal cord stimulator trial.  He follows up after his psychology visit and is cleared from a psychological standpoint.  Patient wants to hold off until his wife's surgery is complete and she has recovered before he goes through with this trial.  This is reasonable.  I will also refill the patient's medications today.  The patient  has no drug history on file. His body mass index is 24.61 kg/m.  Further details on both, my assessment(s), as well as the proposed treatment plan, please see below.  Controlled Substance Pharmacotherapy Assessment REMS (Risk Evaluation and Mitigation Strategy)  Analgesic: Percocet 7.5 mg 3 times daily as needed, quantity 100 a month MME/day: 22.5 mg/day.  Janett Billow, RN  06/05/2017 12:09 PM  Sign at close encounter Nursing Pain Medication Assessment:  Safety precautions to  be maintained throughout the outpatient stay will include: orient to surroundings, keep bed in low position,  maintain call bell within reach at all times, provide assistance with transfer out of bed and ambulation.  Medication Inspection Compliance: [name] did not comply with our request to bring his pills to be counted. He was reminded that bringing the medication bottles, even when empty, is a requirement.  Medication: None brought in. Pill/Patch Count: None available to be counted. Bottle Appearance: No container available. Did not bring bottle(s) to appointment. Filled Date: N/A Last Medication intake:  Today   Pharmacokinetics: Liberation and absorption (onset of action): WNL Distribution (time to peak effect): WNL Metabolism and excretion (duration of action): WNL         Pharmacodynamics: Desired effects: Analgesia: [name] reports >50% benefit. Functional ability: Patient reports that medication allows him to accomplish basic ADLs Clinically meaningful improvement in function (CMIF): Sustained CMIF goals met Perceived effectiveness: Described as relatively effective, allowing for increase in activities of daily living (ADL) Undesirable effects: Side-effects or Adverse reactions: None reported Monitoring: [place] PMP: Online review of the past 48-monthperiod conducted. Compliant with practice rules and regulations Last UDS on record: No results found for: SUMMARY UDS interpretation: Compliant          Medication Assessment Form: Reviewed. Patient indicates being compliant with therapy Treatment compliance: Compliant Risk Assessment Profile: Aberrant behavior: See prior evaluations. None observed or detected today Comorbid factors increasing risk of overdose: See prior notes. No additional risks detected today Risk of substance use disorder (SUD): Low Opioid Risk Tool - 06/05/17 1211      Family History of Substance Abuse   Alcohol  Negative    Illegal Drugs  Negative    Rx Drugs  Negative      Personal History of Substance Abuse   Alcohol  Positive Male or Male sober  32 years   sober 32 years   Illegal Drugs  Negative    Rx Drugs  Negative      Psychological Disease   Psychological Disease  Positive    ADD  Negative    OCD  Negative    Bipolar  Negative    Schizophrenia  Negative    Depression  Positive      Total Score   Opioid Risk Tool Scoring  6    Opioid Risk Interpretation  Moderate Risk      ORT Scoring interpretation table:  Score <3 = Low Risk for SUD  Score between 4-7 = Moderate Risk for SUD  Score >8 = High Risk for Opioid Abuse   Risk Mitigation Strategies:  Patient Counseling: Covered Patient-Prescriber Agreement (PPA): Present and active  Notification to other healthcare providers: Done  Pharmacologic Plan: No change in therapy, at this time.             Laboratory Chemistry  Inflammation Markers (CRP: Acute Phase) (ESR: Chronic Phase) Lab Results  Component Value Date   ESRSEDRATE 11 01/29/2017                 Rheumatology Markers No results found for: RF, ANA, LABURIC, URICUR, LYMEIGGIGMAB, LYMEABIGMQN              Renal Function Markers Lab Results  Component Value Date   BUN 37 (H) 01/29/2017   CREATININE 1.14 01/29/2017   GFRAA >60 08/10/2016   GFRNONAA 59 (L) 08/10/2016                 Hepatic  Function Markers Lab Results  Component Value Date   AST 28 01/29/2017   ALT 29 01/29/2017   ALBUMIN 4.3 01/29/2017   ALKPHOS 23 (L) 01/29/2017   AMMONIA 14 08/09/2016                 Electrolytes Lab Results  Component Value Date   NA 139 01/29/2017   K 3.4 (L) 01/29/2017   CL 97 01/29/2017   CALCIUM 10.4 01/29/2017                 Neuropathy Markers Lab Results  Component Value Date   HGBA1C 6.7 (H) 02/27/2017   HIV NONREACTIVE 06/19/2016                 Bone Pathology Markers No results found for: VD25OH, [id number], HV7473UY3, JQ9643CV8, 25OHVITD1, 25OHVITD2, 25OHVITD3, TESTOFREE, TESTOSTERONE               Coagulation Parameters Lab Results  Component Value Date   INR 1.08  11/05/2014   LABPROT 14.2 11/05/2014   APTT 34 11/05/2014   PLT 303.0 01/29/2017                 Cardiovascular Markers Lab Results  Component Value Date   CKTOTAL 468 (H) 08/14/2016   HGB 12.3 (L) 01/29/2017   HCT 36.6 (L) 01/29/2017                 CA Markers No results found for: CEA, CA125, LABCA2               Note: Lab results reviewed.  Recent Diagnostic Imaging Results  MR LUMBAR SPINE WO CONTRAST CLINICAL DATA:  Left sciatic pain for 2 years.  Prior back surgery.  EXAM: MRI LUMBAR SPINE WITHOUT CONTRAST  TECHNIQUE: Multiplanar, multisequence MR imaging of the lumbar spine was performed. No intravenous contrast was administered.  COMPARISON:  03/29/2016  FINDINGS: Segmentation:  Standard.  Alignment:  Physiologic.  Vertebrae:  No fracture, evidence of discitis, or bone lesion.  Conus medullaris and cauda equina: Conus extends to the T12-L1 level. Conus and cauda equina appear normal.  Paraspinal and other soft tissues: No paraspinal abnormality.  Disc levels:  Disc spaces: Degenerative disc disease mild disc height loss throughout the lumbar spine.  T12-L1: No significant disc bulge. No evidence of neural foraminal stenosis. No central canal stenosis.  L1-L2: No significant disc bulge. No evidence of neural foraminal stenosis. No central canal stenosis.  L2-L3: No significant disc bulge. No evidence of neural foraminal stenosis. No central canal stenosis.  L3-L4: Mild broad-based disc bulge. Mild bilateral facet arthropathy. Moderate right foraminal stenosis. No central canal stenosis.  L4-L5: Mild broad-based disc bulge. Severe bilateral facet arthropathy. Bilateral lateral recess stenosis. Mild bilateral foraminal stenosis. No central canal stenosis.  L5-S1: Broad-based disc bulge with a small central disc protrusion. Moderate bilateral facet arthropathy. Mild left foraminal stenosis. No central canal stenosis.  IMPRESSION: 1. At L3-4  there is a mild broad-based disc bulge. Mild bilateral facet arthropathy. Moderate right foraminal stenosis. 2. At L4-5 there is a mild broad-based disc bulge. Severe bilateral facet arthropathy. Bilateral lateral recess stenosis. Mild bilateral foraminal stenosis. 3. At L5-S1 there is a broad-based disc bulge with a small central disc protrusion. Moderate bilateral facet arthropathy. Mild left foraminal stenosis.  Electronically Signed   By: Kathreen Devoid   On: 04/19/2017 10:33  Complexity Note: Imaging results reviewed. Results shared with Mr. Summerlin, using Layman's terms.  Meds   Current Outpatient Medications:  .  acetaminophen (TYLENOL) 500 MG tablet, Take 1,000 mg by mouth as needed. , Disp: , Rfl:  .  aspirin 500 MG tablet, Take 1,000 mg 2 (two) times daily by mouth., Disp: , Rfl:  .  buPROPion (WELLBUTRIN SR) 150 MG 12 hr tablet, TAKE ONE TABLET BY MOUTH TWICE A DAY, Disp: 180 tablet, Rfl: 0 .  diphenoxylate-atropine (LOMOTIL) 2.5-0.025 MG tablet, TAKE ONE TABLET BY MOUTH FOUR TIMES A DAY AS NEEDED FOR DIARRHEA, Disp: , Rfl:  .  divalproex (DEPAKOTE ER) 250 MG 24 hr tablet, Take 250 mg by mouth 2 (two) times daily. Pt takes 225m in the morning, and 2514min the afternoon, Disp: , Rfl:  .  donepezil (ARICEPT) 10 MG tablet, Take 10 mg by mouth at bedtime., Disp: , Rfl:  .  doxycycline (VIBRA-TABS) 100 MG tablet, Take 1 tablet (100 mg total) by mouth 2 (two) times daily., Disp: 20 tablet, Rfl: 0 .  fenofibrate (TRICOR) 145 MG tablet, TAKE ONE TABLET BY MOUTH DAILY, Disp: 90 tablet, Rfl: 0 .  finasteride (PROSCAR) 5 MG tablet, Take 1 tablet (5 mg total) by mouth daily., Disp: 30 tablet, Rfl: 11 .  gabapentin (NEURONTIN) 600 MG tablet, TAKE TWO TABLETS BY MOUTH THREE TIMES A DAY, Disp: 180 tablet, Rfl: 0 .  glimepiride (AMARYL) 4 MG tablet, Take 1 tablet (4 mg total) by mouth 2 (two) times daily., Disp: 180 tablet, Rfl: 3 .  lisinopril-hydrochlorothiazide  (PRINZIDE,ZESTORETIC) 20-25 MG tablet, TAKE ONE TABLET BY MOUTH TWICE A DAY, Disp: 180 tablet, Rfl: 1 .  MELATONIN PO, Take 20 mg by mouth at bedtime., Disp: , Rfl:  .  memantine (NAMENDA) 10 MG tablet, Take 10 mg by mouth 2 (two) times daily. , Disp: , Rfl:  .  metFORMIN (GLUCOPHAGE) 500 MG tablet, TAKE TWO TABLETS BY MOUTH TWICE A DAY WITH A MEAL, Disp: 360 tablet, Rfl: 1 .  metoprolol succinate (TOPROL-XL) 25 MG 24 hr tablet, Take 25 mg by mouth daily. , Disp: , Rfl:  .  omeprazole (PRILOSEC) 20 MG capsule, 20 mg 2 (two) times daily before a meal. , Disp: , Rfl:  .  oxyCODONE-acetaminophen (PERCOCET) 7.5-325 MG tablet, Take 1 tablet by mouth 3 (three) times daily., Disp: 100 tablet, Rfl: 0 .  primidone (MYSOLINE) 50 MG tablet, TAKE 5 TABLETS IN THE MORNING AND 3 TABLETS IN THE EVENING, Disp: , Rfl:  .  propranolol (INDERAL) 60 MG tablet, Take 60 mg by mouth 2 (two) times daily. , Disp: , Rfl:  .  QUEtiapine (SEROQUEL) 25 MG tablet, TAKE THREE TO FOUR TABLETS BY MOUTH AT NIGHT, Disp: , Rfl:  .  simvastatin (ZOCOR) 40 MG tablet, TAKE 1 TABLET (40 MG TOTAL) BY MOUTH DAILY., Disp: 90 tablet, Rfl: 0 .  sitaGLIPtin (JANUVIA) 100 MG tablet, Take 1 tablet (100 mg total) by mouth daily., Disp: 90 tablet, Rfl: 3 .  tamsulosin (FLOMAX) 0.4 MG CAPS capsule, Take 1 capsule (0.4 mg total) by mouth daily after supper., Disp: 90 capsule, Rfl: 3 .  aspirin EC 81 MG tablet, Take 1,000 mg 2 (two) times daily by mouth. , Disp: , Rfl:  .  benzonatate (TESSALON) 100 MG capsule, Take 1 capsule (100 mg total) by mouth 3 (three) times daily. (Patient not taking: Reported on 06/05/2017), Disp: 20 capsule, Rfl: 0 .  budesonide (ENTOCORT EC) 3 MG 24 hr capsule, Take 3 mg by mouth daily. , Disp: , Rfl:   ROS  Constitutional: Denies any fever or chills Gastrointestinal: No reported hemesis, hematochezia, vomiting, or acute GI distress Musculoskeletal: Denies any acute onset joint swelling, redness, loss of ROM, or  weakness Neurological: No reported episodes of acute onset apraxia, aphasia, dysarthria, agnosia, amnesia, paralysis, loss of coordination, or loss of consciousness  Allergies  Mr. Dahmen is allergic to no known allergies.  PFSH  Drug: Mr. Battie  has no drug history on file. Alcohol:  has no alcohol history on file. Tobacco:  reports that he quit smoking about 9 months ago. His smoking use included cigarettes. He has a 30.00 pack-year smoking history. he has never used smokeless tobacco. 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 Surgical: Mr. Mosley  has a past surgical history that includes esophageal stretch; Tonsillectomy; Total knee arthroplasty (Right, 11/15/2014); Joint replacement; Lumbar laminectomy/decompression microdiscectomy (Left, 02/03/2016); and Colonoscopy with propofol (N/A, 09/14/2016). Family: family history includes Depression in his father; Heart attack in his mother; Heart disease in his mother; Hypertension in his father.  Constitutional Exam  General appearance: Well nourished, well developed, and well hydrated. In no apparent acute distress Vitals:   06/05/17 1200  BP: 122/66  Pulse: 71  Resp: 16  Temp: 98 F (36.7 C)  TempSrc: Oral  SpO2: 98%  Weight: 174 lb (78.9 kg)  Height: 5' 10.5" (1.791 m)   BMI Assessment: Estimated body mass index is 24.61 kg/m as calculated from the following:   Height as of this encounter: 5' 10.5" (1.791 m).   Weight as of this encounter: 174 lb (78.9 kg).  BMI interpretation table: BMI level Category Range association with higher incidence of chronic  pain  <18 kg/m2 Underweight   18.5-24.9 kg/m2 Ideal body weight   25-29.9 kg/m2 Overweight Increased incidence by 20%  30-34.9 kg/m2 Obese (Class I) Increased incidence by 68%  35-39.9 kg/m2 Severe obesity (Class II) Increased incidence by 136%  >40 kg/m2 Extreme obesity (Class III) Increased incidence by 254%   BMI Readings from Last 4 Encounters:  06/05/17 24.61 kg/m  05/27/17 25.48 kg/m  04/02/17 24.68 kg/m  03/18/17 24.97 kg/m   Wt Readings from Last 4 Encounters:  06/05/17 174 lb (78.9 kg)  05/27/17 177 lb 9.6 oz (80.6 kg)  04/02/17 172 lb (78 kg)  03/18/17 174 lb (78.9 kg)  Psych/Mental status: Alert, oriented x 3 (person, place, & time)       Eyes: PERLA Respiratory: No evidence of acute respiratory distress  Cervical Spine Area Exam  Skin & Axial Inspection: No masses, redness, edema, swelling, or associated skin lesions Alignment: Symmetrical Functional ROM: Unrestricted ROM      Stability: No instability detected Muscle Tone/Strength: Functionally intact. No obvious neuro-muscular anomalies detected. Sensory (Neurological): Unimpaired Palpation: No palpable anomalies              Upper Extremity (UE) Exam    Side: Right upper extremity  Side: Left upper extremity  Skin & Extremity Inspection: Skin color, temperature, and hair growth are WNL. No peripheral edema or cyanosis. No masses, redness, swelling, asymmetry, or associated skin lesions. No contractures.  Skin & Extremity Inspection: Skin color, temperature, and hair growth are WNL. No peripheral edema or cyanosis. No masses, redness, swelling, asymmetry, or associated skin lesions. No  contractures.  Functional ROM: Unrestricted ROM          Functional ROM: Unrestricted ROM          Muscle Tone/Strength: Functionally intact. No obvious neuro-muscular anomalies detected.  Muscle Tone/Strength: Functionally intact. No obvious neuro-muscular anomalies detected.  Sensory (Neurological): Unimpaired          Sensory  (Neurological): Unimpaired          Palpation: No palpable anomalies              Palpation: No palpable anomalies              Specialized Test(s): Deferred         Specialized Test(s): Deferred          Thoracic Spine Area Exam  Skin & Axial Inspection: No masses, redness, or swelling Alignment: Symmetrical Functional ROM: Unrestricted ROM Stability: No instability detected Muscle Tone/Strength: Functionally intact. No obvious neuro-muscular anomalies detected. Sensory (Neurological): Unimpaired Muscle strength & Tone: No palpable anomalies  Lumbar Spine Area Exam  Skin & Axial Inspection: No masses, redness, or swelling Alignment: Symmetrical Functional ROM: Decreased ROM     To the left with lumbar extension Stability: No instability detected Muscle Tone/Strength: Functionally intact. No obvious neuro-muscular anomalies detected. Sensory (Neurological): Unimpaired Palpation: No palpable anomalies       Provocative Tests: Lumbar Hyperextension and rotation test: evaluation deferred today       Lumbar Lateral bending test: Positive ipsilateral radicular pain, on the right. Positive for right-sided foraminal stenosis. Patrick's Maneuver: evaluation deferred today                   Positive straight leg raise test on the left Gait & Posture Assessment  Ambulation: Patient ambulates using a cane Gait: Limited. Using assistive device to ambulate Posture: WNL   Lower Extremity Exam    Side: Right lower extremity  Side: Left lower extremity  Skin & Extremity Inspection: Skin color, temperature, and hair growth are WNL. No peripheral edema or cyanosis. No masses, redness, swelling, asymmetry, or associated skin lesions. No contractures.  Skin & Extremity Inspection: Skin color, temperature, and hair growth are WNL. No peripheral edema or cyanosis. No masses, redness, swelling, asymmetry, or associated skin lesions. No contractures.  Functional ROM: Unrestricted ROM           Functional ROM: Unrestricted ROM          Muscle Tone/Strength: Functionally intact. No obvious neuro-muscular anomalies detected.  Muscle Tone/Strength: Functionally intact. No obvious neuro-muscular anomalies detected.  Sensory (Neurological): Unimpaired  Sensory (Neurological): Unimpaired  Palpation: No palpable anomalies  Palpation: No palpable anomalies  5 out of 5 strength bilateral lower extremity: Plantar flexion, dorsiflexion, knee flexion, knee extension.   Assessment  Primary Diagnosis & Pertinent Problem List: The primary encounter diagnosis was Failed back surgical syndrome. Diagnoses of Lumbosacral radiculopathy, Chronic left-sided low back pain with left-sided sciatica, Chronic, continuous use of opioids, and Chronic pain syndrome were also pertinent to this visit.  Status Diagnosis  Persistent Persistent Persistent 1. Failed back surgical syndrome   2. Lumbosacral radiculopathy   3. Chronic left-sided low back pain with left-sided sciatica   4. Chronic, continuous use of opioids   5. Chronic pain syndrome      77 year old male with a history of axial low back pain predominantly left leg pain secondary to failed back surgical syndrome and chronic left-sided lumbosacral radiculopathy.  Patient is being evaluated for spinal cord stimulator trial.  He has  completed his psychological evaluation has been cleared.  Risks and benefits of the procedure were discussed in depth today reviewing a spine model.  All questions and concerns were addressed and patient did not have any additional questions regarding spinal cord stimulation.  I recommended that he discuss spinal cord stimulation trial experience with a patient  Ambassador by way of the device company.  Furthermore I will refill the patient's oxycodone prescription as below.  PMP checked and appropriate.  UDS appropriate.  Plan: -Scheduled for NEVRO spinal cord stimulator trial tentatively for Monday, March 4.  Patient given  chlorhexidine cleaning solution to scrub his back at night before his scheduled procedure. -Refill of oxycodone as below   Plan of Care  Pharmacotherapy (Medications Ordered): Meds ordered this encounter  Medications  . DISCONTD: oxyCODONE-acetaminophen (PERCOCET) 7.5-325 MG tablet    Sig: Take 1 tablet by mouth 3 (three) times daily.    Dispense:  100 tablet    Refill:  0    For chronic pain To fill on or after: 07/02/17, 07/29/17  . oxyCODONE-acetaminophen (PERCOCET) 7.5-325 MG tablet    Sig: Take 1 tablet by mouth 3 (three) times daily.    Dispense:  100 tablet    Refill:  0    For chronic pain To fill on or after: 07/02/17, 07/29/17   Time Note: Greater than 50% of the 25 minute(s) of face-to-face time spent with Mr. Ericsson, was spent in counseling/coordination of care regarding: Mr. Doolin's primary cause of pain, the results of his recent test(s), the treatment plan, the risks and possible complications of proposed treatment, going over the informed consent and realistic expectations.  Provider-requested follow-up: Return in about 7 weeks (around 07/22/2017).  Future Appointments  Date Time Provider [place]  06/10/2017  4:00 PM [name] [facility]  07/22/2017  8:00 AM [name], MD [facility] None  11/15/2017  4:00 PM Dia Crawford, LPN [facility]  11/27/2955  9:30 AM [facility]    Primary Care Physician: Leone Haven, MD Location: [facility] Note by: [name], M.D Date: 06/05/2017; Time: 3:19 PM  Patient Instructions  1. Rx for Oxycodone for 2 months 2. Nevro SCS trial March 4th 2019 3. Scarlette Slice [facility] representative will contact you 4. We will give your chlorhexidine to wash your back with the night before and morning of procedure

## 2017-06-05 NOTE — Progress Notes (Signed)
Nursing Pain Medication Assessment:  Safety precautions to be maintained throughout the outpatient stay will include: orient to surroundings, keep bed in low position, maintain call bell within reach at all times, provide assistance with transfer out of bed and ambulation.  Medication Inspection Compliance: Mr. Glasscock did not comply with our request to bring his pills to be counted. He was reminded that bringing the medication bottles, even when empty, is a requirement.  Medication: None brought in. Pill/Patch Count: None available to be counted. Bottle Appearance: No container available. Did not bring bottle(s) to appointment. Filled Date: N/A Last Medication intake:  Today

## 2017-06-05 NOTE — Patient Instructions (Addendum)
1. Rx for Oxycodone for 2 months 2. Nevro SCS trial March 4th 2019 3. Scarlette Slice [facility] representative will contact you 4. We will give your chlorhexidine to wash your back with the night before and morning of procedure

## 2017-06-06 ENCOUNTER — Other Ambulatory Visit: Payer: Self-pay | Admitting: Family Medicine

## 2017-06-10 ENCOUNTER — Ambulatory Visit: Payer: PPO | Admitting: Pharmacist

## 2017-06-10 NOTE — Progress Notes (Unsigned)
[facility]) Care Management  06/10/2017  James Hood 1941-02-23 [id number]   77 y.o. year old male referred to [place] for Medication Assistance (Pharmacy telephone outreach) Patient was involved with [facility] previously with Karrie Meres, PharmD. Seeing patient in doctor's office to complete patient assistance applications.   PMH s/f: HTN, diabetes, fatty liver, barrett's esophagus, tremor, osteoarthritis, BPH, anxiety and depression, hyperlipidemia, h/o falls.   Patient with [employer] medicare advantage plan - NOT in donut hole.    #Medication Assistance - patient with [employer] Medicare Advantage plan that is not in donut hole and is interested in applying for medication assistance through manufacturer programs for the following medications: Januvia Below still required to complete application: PATIENT:  [ ]  Completed and signed application [name] ] Financial information [ ]  Insurance card information  PROVIDER::  Rx information -  [name] ] Januvia Signed application - [name] ] [place], Pharm.D., BCPS PGY2 Ambulatory Care Pharmacy Resident Phone: [phone]

## 2017-06-11 ENCOUNTER — Ambulatory Visit: Payer: PPO | Admitting: Pharmacist

## 2017-06-18 ENCOUNTER — Ambulatory Visit: Payer: Self-pay | Admitting: Pharmacist

## 2017-06-21 ENCOUNTER — Telehealth: Payer: Self-pay | Admitting: Pharmacist

## 2017-06-21 NOTE — Patient Outreach (Signed)
[facility]) Care Management  06/21/2017  James Hood 05-10-1941 [id number]   77 y.o. year old male referred to [place] for Medication Assistance (Pharmacy telephone outreach) Patient was involved with [place] previously with James Hood, PharmD. Calling patient to offer patient assistance with [facility].   PMH s/f: HTN, diabetes, fatty liver, barrett's esophagus, tremor, osteoarthritis, BPH, anxiety and depression, hyperlipidemia, h/o falls.   Patient with [employer] medicare advantage plan - NOT in donut hole.   #Medication Assistance - patient with [employer] Medicare Advantage plan that is not in donut hole and is interested in applying for medication assistance through manufacturer programs for the following medications: Januvia Below still required to complete application: PATIENT:  [name] ] Completed and signed application [name] ] Financial information  PROVIDER::  Rx information -  [name] ] Januvia Signed application - [name] ] Januvia Will follow up within 10 business days with [facility].     James Hood, Pharm.D., BCPS PGY2 Ambulatory Care Pharmacy Resident Phone: [phone]

## 2017-06-23 ENCOUNTER — Other Ambulatory Visit: Payer: Self-pay | Admitting: Family Medicine

## 2017-06-25 IMAGING — CR DG LUMBAR SPINE 2-3V
2 series · 2 of 2 positions shown · non-contrast
Comparison: 01/12/2016.  MRI 09/22/2015.

CLINICAL DATA: L5-S1 diskectomy

EXAM:
LUMBAR SPINE - 2-3 VIEW

[lat (1 of 2)]
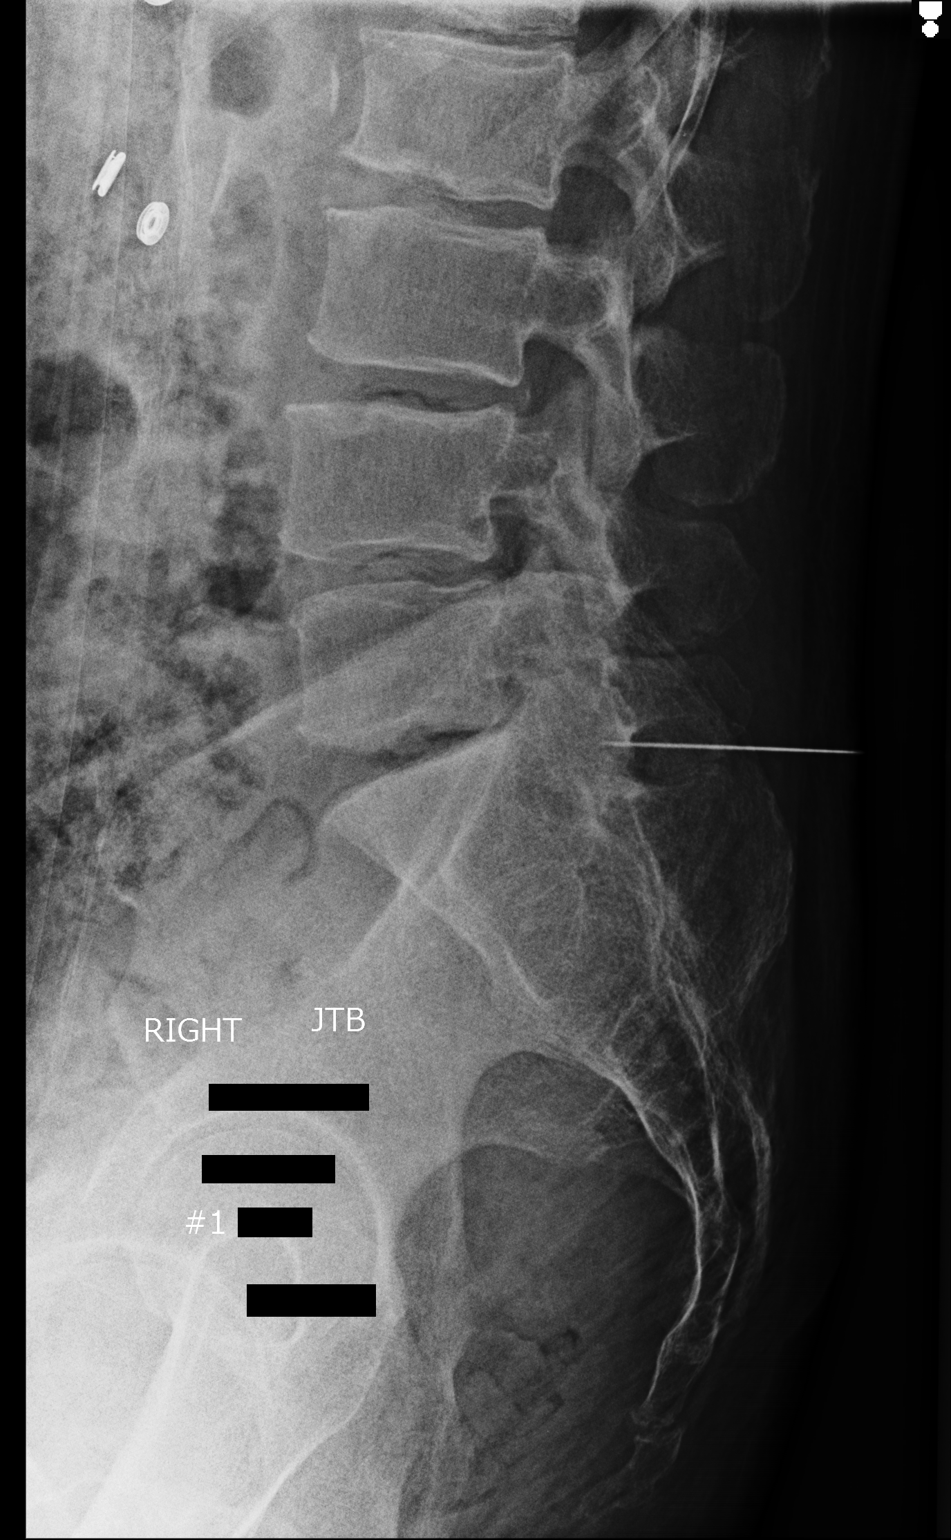

[lat (2 of 2)]
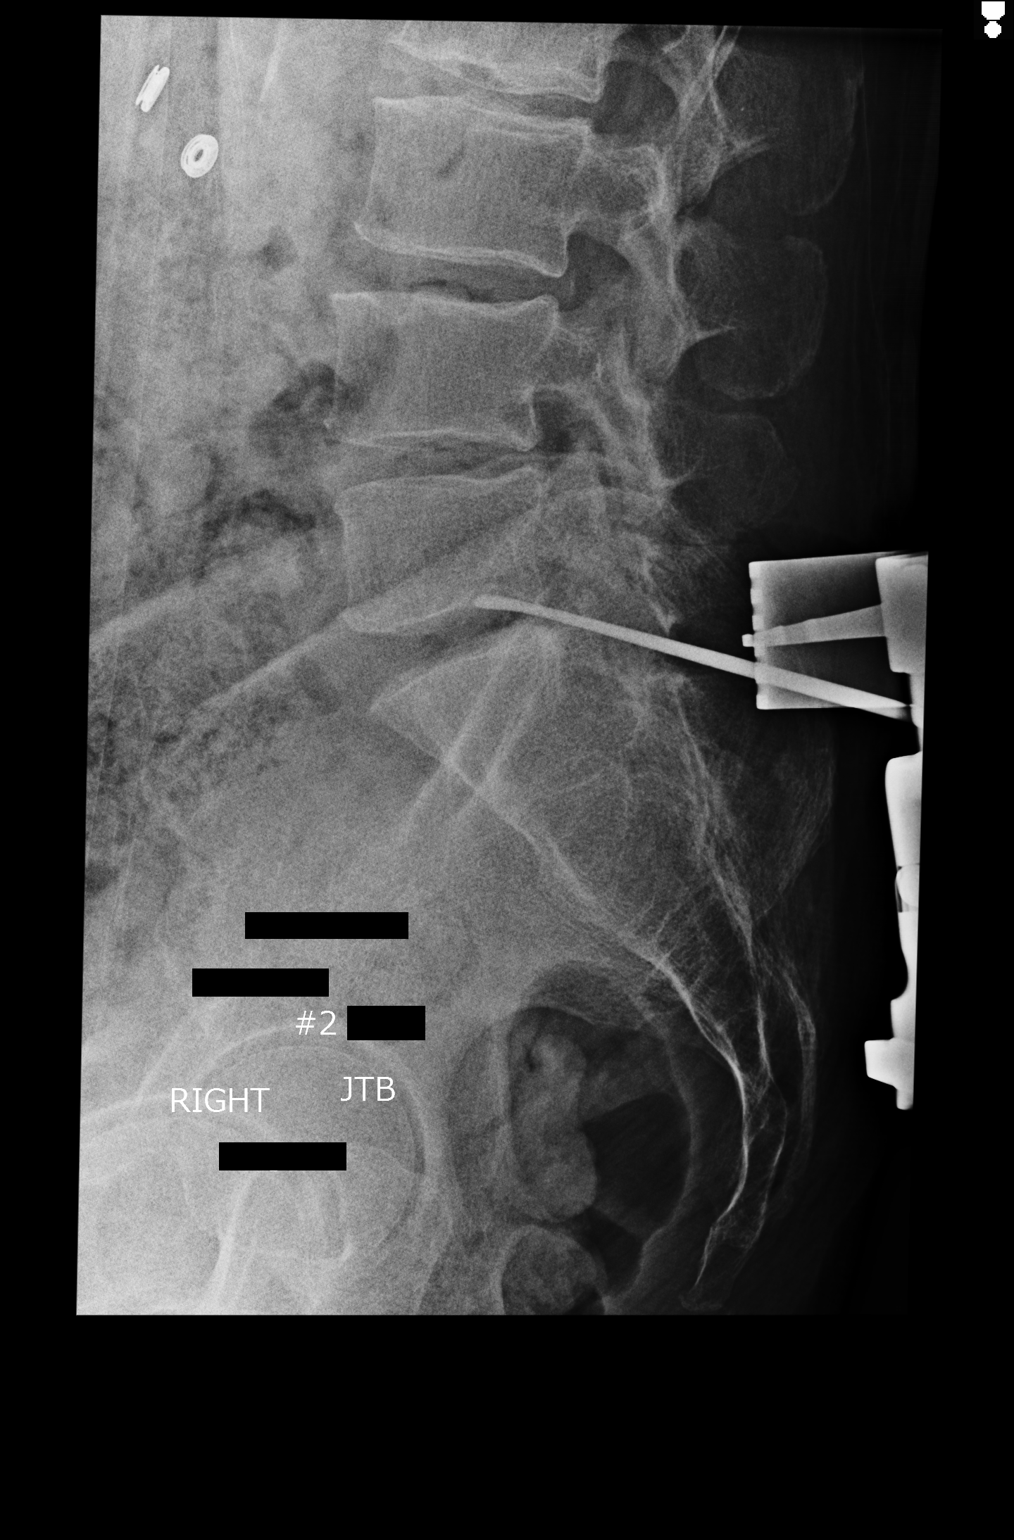

[2 of 2 positions shown; findings below may reference images not displayed]

FINDINGS: Same numbering scheme used today as on the previous MRI study.

Portable cross-table lateral film labeled 1 obtained at 3936 hours
shows a spinal needle overlying the posterior elements of the lower
spine with the tip positioned at the inferior aspect of the L5-S1
facets.

Second film labeled 2 obtained at 8887 hours shows soft tissue
retractors posteriorly. Radiopaque surgical probe is positioned with
the tip overlying the posterior L5 inferior endplate.
IMPRESSION: Intraoperative localization.

## 2017-06-28 NOTE — Telephone Encounter (Signed)
Sent to pharmacy.  He needs to be set up for fasting labs.  Thanks.

## 2017-06-28 NOTE — Telephone Encounter (Signed)
See no  Lipid or LDL since 4/17 please advise to refill?

## 2017-07-02 ENCOUNTER — Telehealth: Payer: Self-pay | Admitting: Family Medicine

## 2017-07-02 MED ORDER — SITAGLIPTIN PHOSPHATE 100 MG PO TABS: 100.0000 mg | ORAL_TABLET | Freq: Every day | ORAL | 2 refills | Status: DC

## 2017-07-02 NOTE — Telephone Encounter (Signed)
Called [facility] to check on status of Januvia application. Per [facility] representative, [place], they have not received application yet. This is typical of their usual timeline.   Called patient to relay the above information. He expresses understanding and requests a prescription for Januvia to be sent to his pharmacy so he doesn't run out. Confirms that he takes Januvia 100 mg once daily.   Will route this to Dr. Caryl Bis. Will follow up with [facility] next week.   [name], Pharm.D., BCPS PGY2 Ambulatory Care Pharmacy Resident Phone: [phone]

## 2017-07-02 NOTE — Telephone Encounter (Signed)
FYI

## 2017-07-02 NOTE — Telephone Encounter (Signed)
Copied from [place] [id number]. Topic: Quick Communication - See Telephone Encounter >> Jul 02, 2017  1:38 PM Bea Graff, NT wrote: CRM for notification. See Telephone encounter for: Pt calling to speak with the office pharmacist, Deirdre Pippins. He states he left a form with her to be sent to the drug company (he cannot think of the name). He wants to see if this has been sent. Please advise.   07/02/17.

## 2017-07-02 NOTE — Telephone Encounter (Signed)
Januvia sent to his pharmacy.

## 2017-07-06 ENCOUNTER — Other Ambulatory Visit: Payer: Self-pay | Admitting: Family Medicine

## 2017-07-09 ENCOUNTER — Other Ambulatory Visit: Payer: Self-pay

## 2017-07-09 ENCOUNTER — Telehealth: Payer: Self-pay | Admitting: Pharmacist

## 2017-07-09 MED ORDER — GABAPENTIN 600 MG PO TABS: ORAL_TABLET | ORAL | 0 refills | Status: DC

## 2017-07-09 NOTE — Patient Outreach (Signed)
[facility]) Care Management  07/09/2017  JANIEL CRISOSTOMO 04/23/1941 [id number]   76 y.o.year old malereferred to [place] for Medication Assistance (Pharmacy telephone outreach) Patient was involved with [place] previously with Karrie Meres, PharmD. Calling patient to offer patient assistance with Januvia.  PMH s/f:HTN, diabetes, fatty liver, barrett's esophagus, tremor, osteoarthritis, BPH, anxiety and depression, hyperlipidemia, h/o falls.  Patient with[employer] Advantag[employer]icare advantage plan - NOT in donut hole.  #Medication [place] [employer] Medicare Advantage plan thatis notin donut hole. Application for Januvia submitted to [facility], per [facility] representative, the application has been received and they are mailing the attestation form to patient's home today.  -Called patient to inform him of the above and provide guidance on how to mail back the attestation form. Patient verbalizes understanding.  -Will follow up within 10 business days with [facility] and patient.    [name], Pharm.D., BCPS PGY2 Ambulatory Care Pharmacy Resident Phone: [phone]

## 2017-07-10 DIAGNOSIS — M7582 Other shoulder lesions, left shoulder: Secondary | ICD-10-CM | POA: Diagnosis not present

## 2017-07-12 ENCOUNTER — Other Ambulatory Visit: Payer: Self-pay | Admitting: Orthopedic Surgery

## 2017-07-12 ENCOUNTER — Ambulatory Visit
Admission: RE | Admit: 2017-07-12 | Discharge: 2017-07-12 | Disposition: A | Discharge status: Home / Self Care | Payer: PPO | Source: Ambulatory Visit | Attending: Orthopedic Surgery | Admitting: Orthopedic Surgery

## 2017-07-12 DIAGNOSIS — Z96659 Presence of unspecified artificial knee joint: Secondary | ICD-10-CM | POA: Insufficient documentation

## 2017-07-12 DIAGNOSIS — M25562 Pain in left knee: Secondary | ICD-10-CM | POA: Insufficient documentation

## 2017-07-12 DIAGNOSIS — M19012 Primary osteoarthritis, left shoulder: Secondary | ICD-10-CM | POA: Insufficient documentation

## 2017-07-12 DIAGNOSIS — M25461 Effusion, right knee: Secondary | ICD-10-CM | POA: Diagnosis not present

## 2017-07-12 DIAGNOSIS — S8992XA Unspecified injury of left lower leg, initial encounter: Secondary | ICD-10-CM | POA: Diagnosis not present

## 2017-07-12 DIAGNOSIS — R52 Pain, unspecified: Secondary | ICD-10-CM

## 2017-07-12 DIAGNOSIS — M25512 Pain in left shoulder: Secondary | ICD-10-CM | POA: Diagnosis not present

## 2017-07-12 DIAGNOSIS — S8991XA Unspecified injury of right lower leg, initial encounter: Secondary | ICD-10-CM | POA: Diagnosis not present

## 2017-07-19 DIAGNOSIS — G8929 Other chronic pain: Secondary | ICD-10-CM

## 2017-07-19 HISTORY — DX: Other chronic pain: G89.29

## 2017-07-22 ENCOUNTER — Other Ambulatory Visit: Payer: Self-pay | Admitting: Student in an Organized Health Care Education/Training Program

## 2017-07-22 ENCOUNTER — Ambulatory Visit
Admission: RE | Admit: 2017-07-22 | Discharge: 2017-07-22 | Disposition: A | Discharge status: Home / Self Care | Payer: PPO | Source: Ambulatory Visit | Attending: Student in an Organized Health Care Education/Training Program | Admitting: Student in an Organized Health Care Education/Training Program

## 2017-07-22 ENCOUNTER — Encounter: Payer: Self-pay | Admitting: Student in an Organized Health Care Education/Training Program

## 2017-07-22 ENCOUNTER — Ambulatory Visit (HOSPITAL_BASED_OUTPATIENT_CLINIC_OR_DEPARTMENT_OTHER): Payer: PPO | Admitting: Student in an Organized Health Care Education/Training Program

## 2017-07-22 VITALS — BP 106/57 | HR 68 | Temp 97.2°F | Resp 18 | Ht 70.0 in | Wt 174.0 lb

## 2017-07-22 DIAGNOSIS — G894 Chronic pain syndrome: Secondary | ICD-10-CM

## 2017-07-22 DIAGNOSIS — R52 Pain, unspecified: Secondary | ICD-10-CM

## 2017-07-22 DIAGNOSIS — M961 Postlaminectomy syndrome, not elsewhere classified: Secondary | ICD-10-CM | POA: Diagnosis not present

## 2017-07-22 DIAGNOSIS — M5417 Radiculopathy, lumbosacral region: Secondary | ICD-10-CM | POA: Diagnosis not present

## 2017-07-22 DIAGNOSIS — M79605 Pain in left leg: Secondary | ICD-10-CM | POA: Diagnosis not present

## 2017-07-22 DIAGNOSIS — Z96651 Presence of right artificial knee joint: Secondary | ICD-10-CM | POA: Diagnosis not present

## 2017-07-22 MED ORDER — FENTANYL CITRATE (PF) 100 MCG/2ML IJ SOLN
25.0000 ug | INTRAMUSCULAR | Status: DC | PRN
  Administered 2017-07-22: 100 ug via INTRAVENOUS
  Filled 2017-07-22 (×2): qty 2

## 2017-07-22 MED ORDER — FENTANYL CITRATE (PF) 100 MCG/2ML IJ SOLN
25.0000 ug | INTRAMUSCULAR | Status: DC | PRN
  Administered 2017-07-22: 125 ug via INTRAVENOUS

## 2017-07-22 MED ORDER — LIDOCAINE HCL 1 % IJ SOLN
10.0000 mL | Freq: Once | INTRAMUSCULAR | Status: AC
  Administered 2017-07-22: 10 mL
  Filled 2017-07-22: qty 10

## 2017-07-22 MED ORDER — CEFAZOLIN SODIUM 1 G IJ SOLR
INTRAMUSCULAR | Status: AC
  Filled 2017-07-22: qty 20

## 2017-07-22 MED ORDER — LIDOCAINE HCL (PF) 1 % IJ SOLN
INTRAMUSCULAR | Status: AC
  Filled 2017-07-22: qty 5

## 2017-07-22 MED ORDER — CEPHALEXIN 500 MG PO CAPS: 500.0000 mg | ORAL_CAPSULE | Freq: Four times a day (QID) | ORAL | 0 refills | Status: AC

## 2017-07-22 MED ORDER — CEFAZOLIN SODIUM-DEXTROSE 2-4 GM/100ML-% IV SOLN
2.0000 g | Freq: Once | INTRAVENOUS | Status: AC
  Administered 2017-07-22: 2 g via INTRAVENOUS
  Filled 2017-07-22: qty 100

## 2017-07-22 MED ORDER — ROPIVACAINE HCL 2 MG/ML IJ SOLN
10.0000 mL | Freq: Once | INTRAMUSCULAR | Status: AC
  Administered 2017-07-22: 10 mL
  Filled 2017-07-22: qty 10

## 2017-07-22 MED ORDER — LACTATED RINGERS IV SOLN
1000.0000 mL | Freq: Once | INTRAVENOUS | Status: AC
  Administered 2017-07-22: 1000 mL via INTRAVENOUS

## 2017-07-22 NOTE — Patient Instructions (Signed)
Today we did the following -We have done a Spinal Cord Stimulator Trial with [employer]  -As long as the leads are in place, do not bathe or shower. You may sponge bathe.  -While the lead is in place, please limit the bending, lifting, or twisting because the lead can move.  -The things we want to see is if your pain improves (and by what percentage), if you can do more activity (don't overdo it), and if you can use less of your "as needed" medicine. Do not stop long acting medicines like methadone, oxycontin, MS Contin, etc without checking with [place].  -It is VERY important that you pick up the antibiotics we prescribed, Keflex, on your way home from the trial and take them as prescribed(4 times a day), starting today, for as long as the lead is in place.  -The Spina Cord Stimulator Representative will be in contact with you while the lead is in place to make sure the trial goes as well as possible.  -Please contact us with any questions or concerns at any time during the trial.   -If you start running a fever over 100 degrees, have severe back pain, or new pain running down the legs, or drainage coming from the lead site, contact us immediately and/or go to the emergency room.  -Please do not restart any sort of medication that can thin your blood such as Aspirin, ibuprofen, motrin, aleve, plavix, coumadin, etc. If you aren't sure, call and ask.  -We will have you return on Monday 07/29/17  to have the lead removed. If this is successful, at that point we can go over the details about the permanent implant.

## 2017-07-22 NOTE — Progress Notes (Signed)
Safety precautions to be maintained throughout the outpatient stay will include: orient to surroundings, keep bed in low position, maintain call bell within reach at all times, provide assistance with transfer out of bed and ambulation.  

## 2017-07-22 NOTE — Progress Notes (Addendum)
Patient's Name: James Hood  MRN: [id number]  Referring Provider: Leone Haven, MD  DOB: 1940/06/26  PCP: Leone Haven, MD  DOS: 07/22/2017  Note by: [name], MD  Service setting: Ambulatory outpatient  Specialty: Interventional Pain Management  Patient type: Established  Location: [facility] (AMB) Pain Management Facility  Visit type: Interventional Procedure   Primary Reason for Visit: Interventional Pain Management Treatment. CC: Leg Pain (left)  Procedure:  Anesthesia, Analgesia, Anxiolysis:  Type: Fluoroscopically guided placement of Spinal Cord Stimulator for trial. Company: Region: Thoracic Level: Lead 1: left superior T8 (ventral)    Lead 2: right superior T9 (dorsal) Laterality: Midline (right and left of midline as described above)         Type: Local Anesthesia with Moderate (Conscious) Sedation Local Anesthetic: Lidocaine 1% Route: Intravenous (IV) IV Access: Secured Sedation: Meaningful verbal contact was maintained at all times during the procedure  Indication(s): Analgesia and Anxiety  Indications: 1. Failed back surgical syndrome   2. Lumbosacral radiculopathy   3. Chronic pain syndrome    Pain Score: Pre-procedure: 7 /10 Post-procedure: 0-No pain/10  Trial lead kit [id number] X2 Lot: [id number] Expiration 03/18/2020  Pre-op Assessment:  James Hood is a 77 y.o. (year old), male patient, seen today for interventional treatment. 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 James Hood has a current medication list which includes the following prescription(s): acetaminophen, aspirin ec, budesonide, bupropion, cephalexin, dicyclomine, diphenoxylate-atropine, divalproex, donepezil, doxycycline, fenofibrate, finasteride, gabapentin, glimepiride,  lisinopril-hydrochlorothiazide, melatonin, memantine, metformin, metoprolol succinate, omeprazole, oxycodone-acetaminophen, primidone, propranolol, quetiapine, simvastatin, sitagliptin, tamsulosin, aspirin, and benzonatate, and the following Facility-Administered Medications: fentanyl and fentanyl. His primarily concern today is the Leg Pain (left)  Initial Vital Signs:  Pulse Rate: 70 Temp: 98 F (36.7 C) Resp: 16 BP: (!) 96/55 SpO2: 96 %  BMI: Estimated body mass index is 24.97 kg/m as calculated from the following:   Height as of this encounter: _0  (1.778 m).   Weight as of this encounter: 174 lb (78.9 kg).  5 out of 5 strength bilateral lower extremity: Plantar flexion, dorsiflexion, knee flexion, knee extension.  Risk Assessment: Allergies: Reviewed. He is allergic to no known allergies.  Allergy Precautions: None required Coagulopathies: Reviewed. None identified.  Blood-thinner therapy: None at this time Active Infection(s): Reviewed. None identified. James Hood is afebrile  Site Confirmation: James Hood was asked to confirm the procedure and laterality before marking the site Procedure checklist: Completed Consent: Before the procedure and under the influence of no sedative(s), amnesic(s), or anxiolytics, the patient was informed of the treatment options, risks and possible complications. To fulfill our ethical and legal obligations, as recommended by the American Medical Association's Code of Ethics, I have informed the patient of my clinical impression; the nature and purpose of the treatment or procedure; the risks, benefits, and possible complications of the intervention; the alternatives, including doing nothing; the risk(s) and benefit(s) of the alternative treatment(s) or procedure(s); and the risk(s) and benefit(s) of doing nothing. The patient was provided information about the general risks and possible complications associated with the procedure. These may  include, but are not limited to: failure to achieve desired goals, infection, bleeding, organ or nerve damage, allergic reactions, paralysis, and death. In addition, the patient was informed of those risks and complications associated to Spine-related procedures, such as failure to decrease pain; infection (i.e.: Meningitis, epidural  or intraspinal abscess); bleeding (i.e.: epidural hematoma, subarachnoid hemorrhage, or any other type of intraspinal or peri-dural bleeding); organ or nerve damage (i.e.: Any type of peripheral nerve, nerve root, or spinal cord injury) with subsequent damage to sensory, motor, and/or autonomic systems, resulting in permanent pain, numbness, and/or weakness of one or several areas of the body; allergic reactions; (i.e.: anaphylactic reaction); and/or death. Furthermore, the patient was informed of those risks and complications associated with the medications. These include, but are not limited to: allergic reactions (i.e.: anaphylactic or anaphylactoid reaction(s)); adrenal axis suppression; blood sugar elevation that in diabetics may result in ketoacidosis or comma; water retention that in patients with history of congestive heart failure may result in shortness of breath, pulmonary edema, and decompensation with resultant heart failure; weight gain; swelling or edema; medication-induced neural toxicity; particulate matter embolism and blood vessel occlusion with resultant organ, and/or nervous system infarction; and/or aseptic necrosis of one or more joints. Finally, the patient was informed that Medicine is not an exact science; therefore, there is also the possibility of unforeseen or unpredictable risks and/or possible complications that may result in a catastrophic outcome. The patient indicated having understood very clearly. We have given the patient no guarantees and we have made no promises. Enough time was given to the patient to ask questions, all of which were answered  to the patient's satisfaction. James Hood has indicated that he wanted to continue with the procedure. Attestation: I, the ordering provider, attest that I have discussed with the patient the benefits, risks, side-effects, alternatives, likelihood of achieving goals, and potential problems during recovery for the procedure that I have provided informed consent. Date  Time: 07/22/2017  7:47 AM  Pre-Procedure Preparation:  Monitoring: As per clinic protocol. Respiration, ETCO2, SpO2, BP, heart rate and rhythm monitor placed and checked for adequate function Safety Precautions: Patient was assessed for positional comfort and pressure points before starting the procedure. Time-out: I initiated and conducted the "Time-out" before starting the procedure, as per protocol. The patient was asked to participate by confirming the accuracy of the "Time Out" information. Verification of the correct person, site, and procedure were performed and confirmed by me, the nursing staff, and the patient. "Time-out" conducted as per Joint Commission's Universal Protocol (UP.01.01.01). Time: 0845  Description of Procedure Process:   Position: Prone with head of the table was raised to facilitate breathing. Target Area: The interlaminar space, paramedian approach  Informed consent was obtained and potential risks discussed including, but not limited to: bleeding, bruising, severe allergic reaction to components of the injection materials, compression of the spinal cord, infection (superficial, deep, abscess and meningitis), nerve or spinal cord damage, paralysis, inability to place the needle properly, arachnoiditis, the possibility of no benefit (pain relief) derived from the injection, or in rare occasions worsening of pain. Questions were answered to the patient's satisfaction and the patient wishes to proceed. Alternative options for treatment have previously been discussed and explored with the patient.  The patient  denies taking antiplatelet or anticoagulation medications and has a driver today.  After informed consent was signed and an IV started, the patient was positioned prone on the fluoroscopy table and pressure points were padded. Standard ASA monitors were applied. A timeout protocol was performed per [facility] hospital policy. Hand washing with antibacterial soap and water and/or the use of alcohol based cleanser was performed. Proper protective gear was worn by the physician including a mask, scrub cap, sterile gown, and sterile gloves.   The patient  received IV cefazolin 2 grams before the start of the procedure.  The back was washed generously with Chloroprep x 2 and draped in a sterile fashion with laparatomy drape. The C-arm was covered with a sterile plastic cover.   An AP fluoroscopic view of L1-L4 was identified and optimized with a slight caudad angulation. The planned point of entry was marked, medial to the left L2 pedicle. Using a 25-gauge needle and 0.5% lidocaine plain a skin wheal was raised and deeper tissues infiltrated.   Using a 14g Tuohy epidural needle provided in the SCS trial kit and starting paramedially aiming toward midline, access to the epidural space at T11/12 was attained utilizing LOR technique with air and intermittent fluoroscopic AP and lateral guidance. The styletted SCS  lead was advanced under both lateral and AP fluoroscopy.  Upon advancement of the SCS lead, on lateral view, it was noted that the SCS lead was ventral.  Another attempt was tried at an interspace above at T10-T11 and unable to enter epidural space there.  Tried manipulating 14-gauge Touhy at T11/T12 however SCS lead continues to be ventral.  After discussion with spinal cord stimulator representative, we will turn down stimulation for the ventral lead and have it in place just in case we are not capturing or getting adequate benefit with the second lead below.  Another planned point of entry was marked,  medial to the Right L2 pedicle. Using a 25-gauge needle and 0.5% lidocaine plain a skin wheal was raised and deeper tissues infiltrated.   Using the provided 14g Tuohy epidural needle from the SCS trial kit and starting paramedially aiming toward midline, access to the epidural space at was attained utilizing LOR technique at the T11/12 level with air and intermittent fluoroscopic AP and lateral guidance. The styletted SCS lead was advanced under both lateral and AP fluoroscopy and was determined to be in the posterior epidural space.  The final position of the lead was at superior of T-8 on the left (ventral position, many attempts to try and get SCS lead dorsal but will turn stimulation down on this lead and keep in place in case we are not getting good stimulation of the other lead) and to the superior of T-9 on the right (dorsal).  The leads were secured sutures and with multiple sterile gauze and tegaderms.  Start Time: 0845 hrs. End Time: 1124 hrs. Materials:  Needle(s) Type: Epidural needle Gauge: 17G Length: 3.5-in Medication(s): Please see orders for medications and dosing details.  Type of Imaging Technique: Fluoroscopy Guidance (Spinal) Indication(s): Assistance in needle guidance and placement for procedures requiring needle placement in or near specific anatomical locations not easily accessible without such assistance. Exposure Time: Please see nurses notes.  Antibiotic Prophylaxis:   Anti-infectives (From admission, onward)   Start     Dose/Rate Route Frequency Ordered Stop   07/22/17 0900  ceFAZolin (ANCEF) IVPB 2g/100 mL premix     2 g 200 mL/hr over 30 Minutes Intravenous  Once 07/22/17 0839 07/22/17 0928   07/22/17 0000  cephALEXin (KEFLEX) 500 MG capsule     500 mg Oral 4 times daily 07/22/17 0804 07/29/17 2359     Indication(s): None identified  Post-operative Assessment:  Post-procedure Vital Signs:  Pulse Rate: 68 Temp: (!) 97.2 F (36.2 C) Resp: 18 BP:  (!) 106/57 SpO2: 97 %  EBL: None  Complications: No immediate post-treatment complications observed by team, or reported by patient.  Note: The patient tolerated the entire procedure well. A repeat  set of vitals were taken after the procedure and the patient was kept under observation following institutional policy, for this type of procedure. Post-procedural neurological assessment was performed, showing return to baseline, prior to discharge. The patient was provided with post-procedure discharge instructions, including a section on how to identify potential problems. Should any problems arise concerning this procedure, the patient was given instructions to immediately contact us, at any time, without hesitation. In any case, we plan to contact the patient by telephone for a follow-up status report regarding this interventional procedure.  Comments:  No additional relevant information. 5 out of 5 strength bilateral lower extremity: Plantar flexion, dorsiflexion, knee flexion, knee extension.  Plan of Care   Imaging Orders     DG C-Arm 1-60 Min-No Report Procedure Orders    No procedure(s) ordered today   AVS instructions for patient  -We have done a Spinal Cord Stimulator Trial with NEVRO  -As long as the leads are in place, do not bathe or shower. You may sponge bathe.  -While the lead is in place, please limit the bending, lifting, or twisting because the lead can move.  -The things we want to see is if your pain improves (and by what percentage), if you can do more activity (don't overdo it), and if you can use less of your "as needed" medicine. Do not stop long acting medicines like methadone, oxycontin, MS Contin, etc without checking with [place].  -It is VERY important that you pick up the antibiotics we prescribed, Keflex, on your way home from the trial and take them as prescribed(4 times a day), starting today, for as long as the lead is in place.  -The Spina Cord Stimulator  Representative will be in contact with you while the lead is in place to make sure the trial goes as well as possible.  -Please contact us with any questions or concerns at any time during the trial.   -If you start running a fever over 100 degrees, have severe back pain, or new pain running down the legs, or drainage coming from the lead site, contact us immediately and/or go to the emergency room.  -Please do not restart any sort of medication that can thin your blood such as Aspirin, ibuprofen, motrin, aleve, plavix, coumadin, etc. If you aren't sure, call and ask.  -We will have you return on Monday 07/29/17  to have the lead removed. If this is successful, at that point we can go over the details about the permanent implant.   Medications ordered for procedure: Meds ordered this encounter  Medications  . lactated ringers infusion 1,000 mL  . fentaNYL (SUBLIMAZE) injection 25-100 mcg    Make sure Narcan is available in the pyxis when using this medication. In the event of respiratory depression (RR< 8/min): Titrate NARCAN (naloxone) in increments of 0.1 to 0.2 mg IV at 2-3 minute intervals, until desired degree of reversal.  . lidocaine (XYLOCAINE) 1 % (with pres) injection 10 mL  . ropivacaine (PF) 2 mg/mL (0.2%) (NAROPIN) injection 10 mL  . ceFAZolin (ANCEF) IVPB 2g/100 mL premix    Order Specific Question:   Antibiotic Indication:    Answer:   Surgical Prophylaxis    Order Specific Question:   Other Indication:    Answer:   Procedure Prophylaxis  . cephALEXin (KEFLEX) 500 MG capsule    Sig: Take 1 capsule (500 mg total) by mouth 4 (four) times daily for 7 days.    Dispense:  28 capsule  Refill:  0  . fentaNYL (SUBLIMAZE) injection 25-100 mcg    Make sure Narcan is available in the pyxis when using this medication. In the event of respiratory depression (RR< 8/min): Titrate NARCAN (naloxone) in increments of 0.1 to 0.2 mg IV at 2-3 minute intervals, until desired degree of  reversal.   Medications administered: We administered lactated ringers, fentaNYL, lidocaine, ropivacaine (PF) 2 mg/mL (0.2%), and ceFAZolin.  See the medical record for exact dosing, route, and time of administration.  New Prescriptions   CEPHALEXIN (KEFLEX) 500 MG CAPSULE    Take 1 capsule (500 mg total) by mouth 4 (four) times daily for 7 days.   Disposition: Discharge home  Discharge Date & Time: 07/22/2017; 1155 hrs.   Physician-requested Follow-up: Return in about 7 days (around 07/29/2017).  Future Appointments  Date Time Provider [place]  07/29/2017  8:00 AM [name], MD [facility] None  11/15/2017  4:00 PM O'Brien-Blaney, Bryson Corona, LPN [facility]  [id number]  9:30 AM [facility]   Primary Care Physician: Leone Haven, MD Location: [facility] Note by: [name], MD Date: 07/22/2017; Time: 12:50 PM  Disclaimer:  Medicine is not an exact science. The only guarantee in medicine is that nothing is guaranteed. It is important to note that the decision to proceed with this intervention was based on the information collected from the patient. The Data and conclusions were drawn from the patient's questionnaire, the interview, and the physical examination. Because the information was provided in large part by the patient, it cannot be guaranteed that it has not been purposely or unconsciously manipulated. Every effort has been made to obtain as much relevant data as possible for this evaluation. It is important to note that the conclusions that lead to this procedure are derived in large part from the available data. Always take into account that the treatment will also be dependent on availability of resources and existing treatment guidelines, considered by other Pain Management Practitioners as being common knowledge and practice, at the time of the intervention. For Medico-Legal purposes, it is also important to point out  that variation in procedural techniques and pharmacological choices are the acceptable norm. The indications, contraindications, technique, and results of the above procedure should only be interpreted and judged by a Board-Certified Interventional Pain Specialist with extensive familiarity and expertise in the same exact procedure and technique.

## 2017-07-23 ENCOUNTER — Telehealth: Payer: Self-pay | Admitting: Pharmacist

## 2017-07-23 ENCOUNTER — Telehealth: Payer: Self-pay | Admitting: *Deleted

## 2017-07-23 NOTE — Progress Notes (Signed)
Spoke with patient yesterday evening as well as this afternoon.  Patient's doing well.  Endorsed back soreness yesterday evening which has improved today.  He states that while he is sitting, he is noticing some pain relief in his legs as well as his ankles due to the stimulation.  However when he walks he continues to have pain that is similar in nature and quality to pre-trial levels.  Patient states that his back soreness is improving.  He has been taking his antibiotics.  Denies any lower extremity weakness, fevers, worsening pain.  Patient was given my cell phone number in case he has any questions or concerns.  Patient's son spent  the night with him last night however patient will be by himself remainder of the week.  I encouraged the patient to call me if he has any questions or concerns.  I will follow up with the patient tomorrow.

## 2017-07-23 NOTE — Telephone Encounter (Signed)
Patient denies any fever, weakness or other complications after his SCS trial yesterday. States he is taking his antibiotic as prescribed.

## 2017-07-23 NOTE — Patient Outreach (Signed)
[name] Care Management  07/23/2017  James Hood [birth year] [id number]   77 y.o. year old male referred to [name] for Medication Assistance (Pharmacy telephone f/u)  Called to follow up with [name] regarding Januvia patient assistance application   Patient confirms identity with HIPAA-identifiers x2 and gives verbal consent to speak over the phone about medications.   Per patient, he mailed back attestation form ~1.5 weeks ago. Per [facility], have not received form and application yet.   Will follow up next week.   [name], Pharm.D., BCPS PGY2 Ambulatory Care Pharmacy Resident Phone: [phone]

## 2017-07-24 NOTE — Progress Notes (Signed)
Spoke with patient this afternoon.  Overall he is doing better in terms of his back soreness.  He states that he was able to perform greater activity around his house today.  He states that his left ankle pain is no different than before.  He is noticing some improvement in his left hip and thigh pain.  Patient denies fevers, lower extremity weakness, bowel or bladder dysfunction.  He states that he has been able to ambulate throughout his house without as much pain and without having to rest as often.  He is endorsing some pruritus along his left bandage site.  Patient is taking his antibiotics as prescribed.  Patient has my phone number should he have any questions or concerns while trial is in place.  Otherwise I will follow up with patient tomorrow.

## 2017-07-25 NOTE — Progress Notes (Signed)
Spoke with patient this afternoon.  Denies lower extremity weakness, fevers, bowel or bladder dysfunction.  Patient states that he was able to go to a meeting yesterday as well as the grocery store and continues to have right hip and right ankle pain that is not much improved than pre-trial.  He is continues to be on program 1.  Spinal cord stimulator representative will contact patient today and try program 2.  Patient is continue to take his antibiotics.  He states that his wife reinforced some of the Tegaderm dressing on his back. Otherwise the patient wants to extend his trial out for a full 7 days to see if he has any additional benefit on program 2.  Patient has my phone number and again I reinforced him to call me with any questions or concerns.  Patient will follow-up Monday morning for SCS removal and to discuss treatment planning.

## 2017-07-29 ENCOUNTER — Ambulatory Visit
Payer: PPO | Attending: Student in an Organized Health Care Education/Training Program | Admitting: Student in an Organized Health Care Education/Training Program

## 2017-07-29 ENCOUNTER — Encounter: Payer: Self-pay | Admitting: Student in an Organized Health Care Education/Training Program

## 2017-07-29 DIAGNOSIS — Z9889 Other specified postprocedural states: Secondary | ICD-10-CM | POA: Insufficient documentation

## 2017-07-29 DIAGNOSIS — R251 Tremor, unspecified: Secondary | ICD-10-CM | POA: Diagnosis not present

## 2017-07-29 DIAGNOSIS — F419 Anxiety disorder, unspecified: Secondary | ICD-10-CM | POA: Insufficient documentation

## 2017-07-29 DIAGNOSIS — K219 Gastro-esophageal reflux disease without esophagitis: Secondary | ICD-10-CM | POA: Insufficient documentation

## 2017-07-29 DIAGNOSIS — E119 Type 2 diabetes mellitus without complications: Secondary | ICD-10-CM | POA: Diagnosis not present

## 2017-07-29 DIAGNOSIS — Z8719 Personal history of other diseases of the digestive system: Secondary | ICD-10-CM | POA: Diagnosis not present

## 2017-07-29 DIAGNOSIS — M5386 Other specified dorsopathies, lumbar region: Secondary | ICD-10-CM

## 2017-07-29 DIAGNOSIS — M79605 Pain in left leg: Secondary | ICD-10-CM | POA: Insufficient documentation

## 2017-07-29 DIAGNOSIS — Z86010 Personal history of colonic polyps: Secondary | ICD-10-CM | POA: Insufficient documentation

## 2017-07-29 DIAGNOSIS — G894 Chronic pain syndrome: Secondary | ICD-10-CM | POA: Diagnosis not present

## 2017-07-29 DIAGNOSIS — Z96651 Presence of right artificial knee joint: Secondary | ICD-10-CM | POA: Diagnosis not present

## 2017-07-29 DIAGNOSIS — Z87891 Personal history of nicotine dependence: Secondary | ICD-10-CM | POA: Diagnosis not present

## 2017-07-29 DIAGNOSIS — M5417 Radiculopathy, lumbosacral region: Secondary | ICD-10-CM | POA: Diagnosis not present

## 2017-07-29 DIAGNOSIS — G479 Sleep disorder, unspecified: Secondary | ICD-10-CM | POA: Diagnosis not present

## 2017-07-29 DIAGNOSIS — M25572 Pain in left ankle and joints of left foot: Secondary | ICD-10-CM | POA: Diagnosis present

## 2017-07-29 DIAGNOSIS — F329 Major depressive disorder, single episode, unspecified: Secondary | ICD-10-CM | POA: Diagnosis not present

## 2017-07-29 DIAGNOSIS — G8929 Other chronic pain: Secondary | ICD-10-CM

## 2017-07-29 DIAGNOSIS — M19072 Primary osteoarthritis, left ankle and foot: Secondary | ICD-10-CM | POA: Diagnosis not present

## 2017-07-29 DIAGNOSIS — R413 Other amnesia: Secondary | ICD-10-CM | POA: Diagnosis not present

## 2017-07-29 DIAGNOSIS — I10 Essential (primary) hypertension: Secondary | ICD-10-CM | POA: Diagnosis not present

## 2017-07-29 DIAGNOSIS — R262 Difficulty in walking, not elsewhere classified: Secondary | ICD-10-CM | POA: Diagnosis not present

## 2017-07-29 DIAGNOSIS — M5442 Lumbago with sciatica, left side: Secondary | ICD-10-CM

## 2017-07-29 DIAGNOSIS — M961 Postlaminectomy syndrome, not elsewhere classified: Secondary | ICD-10-CM

## 2017-07-29 NOTE — Progress Notes (Signed)
Safety precautions to be maintained throughout the outpatient stay will include: orient to surroundings, keep bed in low position, maintain call bell within reach at all times, provide assistance with transfer out of bed and ambulation.   SCS catheter removed via sterile technique by Dr Holley Raring.  Catheter tip intact.

## 2017-07-29 NOTE — Patient Instructions (Signed)
Dr Rennis Harding [email or web address] [phone]

## 2017-07-29 NOTE — Progress Notes (Signed)
Patient's Name: James Hood  MRN: [id number]  Referring Provider: Leone Haven, MD  DOB: [birth year]  PCP: Leone Haven, MD  DOS: 07/29/2017  Note by: [name], MD  Service setting: Ambulatory outpatient  Specialty: Interventional Pain Management  Location: [facility] (AMB) Pain Management Facility    Patient type: Established   Primary Reason(s) for Visit: Encounter for post-procedure evaluation of chronic illness with mild to moderate exacerbation CC: Leg Pain (left) and Ankle Pain (left)  HPI  James Hood is a 77 y.o. year old, male patient, who comes today for a post-procedure evaluation. He has Hypertension; Tremor, essential; Primary localized osteoarthritis of right knee; Anxiety and depression; DJD (degenerative joint disease) of knee; Trochanteric bursitis of left hip; Headache; Falls; Barrett's esophagus; Chronic lumbar pain; BPH (benign prostatic hypertrophy); Benign essential tremor; Benign neoplasm of colon; Chronic diarrhea; Type 2 diabetes mellitus ([place]); Difficulty in walking; Facial droop; HLD (hyperlipidemia); Amnesia; Testicular hypofunction; Absence of sensation; Episode of syncope; Hypertriglyceridemia; Anemia; Lumbar herniated disc; Night sweats; Right hip pain; Purpura ([place]); Thyroid nodule; and Fatty liver on their problem list. His primarily concern today is the Leg Pain (left) and Ankle Pain (left)  Pain Assessment: Location: Left Leg Radiating: patient continues to have left ankle pain.  Onset: More than a month ago Duration: Chronic pain Quality: (ankle is a constant severe ache especially when walking. currently the leg is not hurting at all. ) Severity: 3 /10 (self-reported pain score)  Note: Reported level is compatible with observation.                         When using our objective Pain Scale, levels between 6 and 10/10 are said to belong in an emergency room, as it progressively worsens from a 6/10, described as severely limiting, requiring  emergency care not usually available at an outpatient pain management facility. At a 6/10 level, communication becomes difficult and requires great effort. Assistance to reach the emergency department may be required. Facial flushing and profuse sweating along with potentially dangerous increases in heart rate and blood pressure will be evident. Effect on ADL: rested a lot during the trial.  walked more on yesterday.  Timing: Constant Modifying factors: SCS is helping pain tremendously  Mr. Steeves comes in today for post-procedure evaluation after the treatment done on 07/22/2017.  Patient spinal cord stimulator trial leads were removed.  Lead site was clean without any evidence of redness or pain to palpation.  Spinal cord stimulator trial leads were intact.  Patient's last dose of his antibiotics will be this afternoon.  Patient complaining of persistent left ankle pain.  Further details on both, my assessment(s), as well as the proposed treatment plan, please see below.  Post-Procedure Assessment  07/22/2017 Procedure: NEVRO spinal cord stimulator trial  Long-term benefit: Defined as the period of time past the expected duration of local anesthetics (1 hour for short-acting and 4-6 hours for long-acting). With the possible exception of prolonged sympathetic blockade from the local anesthetics, benefits during this period are typically attributed to, or associated with, other factors such as analgesic sensory neuropraxia, antiinflammatory effects, or beneficial biochemical changes provided by agents other than the local anesthetics.  Reported result: Extended relief following procedure: 60 % (Long-Term Relief)            Interpretative annotation: Clinically appropriate result. Good relief. No permanent benefit expected. Patient underwent spinal cord stimulator trial for 7 days.  Patient states that he noticed the  greatest amount of pain relief in terms of his left leg radicular pain on program  2.  Patient continues to endorse left ankle pain and intermittent left hip pain.  His left ankle pain is most likely secondary to left ankle osteoarthritis.  Patient does note a difference of approximately 60-70% in his left lower extremity neuropathic/radicular pain during the duration of the trial.  He states that he experienced pain relief, greater ease of doing activities of daily living, improvement in his functional status.  Patient was able to walk for an extended period of time without having as much pain.  Patient states that when he also sat and was stationary, his pain in his left leg was significantly better which was not the case prior to his spinal cord stimulator trial.  Even after resting, his left leg pain would be persistent.           Plan:  Please see "Plan of Care" for details.  Patient's progress during his SCS trial was reviewed.  He would like to proceed with a permanent implant.                Laboratory Chemistry  Inflammation Markers (CRP: Acute Phase) (ESR: Chronic Phase) Lab Results  Component Value Date   ESRSEDRATE 11 01/29/2017                         Rheumatology Markers No results found for: RF, ANA, LABURIC, URICUR, LYMEIGGIGMAB, LYMEABIGMQN              Renal Function Markers Lab Results  Component Value Date   BUN 37 (H) 01/29/2017   CREATININE 1.14 01/29/2017   GFRAA >60 08/10/2016   GFRNONAA 59 (L) 08/10/2016                 Hepatic Function Markers Lab Results  Component Value Date   AST 28 01/29/2017   ALT 29 01/29/2017   ALBUMIN 4.3 01/29/2017   ALKPHOS 23 (L) 01/29/2017   AMMONIA 14 08/09/2016                 Electrolytes Lab Results  Component Value Date   NA 139 01/29/2017   K 3.4 (L) 01/29/2017   CL 97 01/29/2017   CALCIUM 10.4 01/29/2017                        Neuropathy Markers Lab Results  Component Value Date   HGBA1C 6.7 (H) 02/27/2017   HIV NONREACTIVE 06/19/2016                 Bone Pathology Markers No results  found for: VD25OH, VD125OH2TOT, WG6659DJ5, TS1779TJ0, 25OHVITD1, 25OHVITD2, 25OHVITD3, TESTOFREE, TESTOSTERONE                       Coagulation Parameters Lab Results  Component Value Date   INR 1.08 11/05/2014   LABPROT 14.2 11/05/2014   APTT 34 11/05/2014   PLT 303.0 01/29/2017                 Cardiovascular Markers Lab Results  Component Value Date   CKTOTAL 468 (H) 08/14/2016   HGB 12.3 (L) 01/29/2017   HCT 36.6 (L) 01/29/2017                 CA Markers No results found for: CEA, CA125, LABCA2  Note: Lab results reviewed.  Recent Diagnostic Imaging Results  DG C-Arm 1-60 Min-No Report Fluoroscopy was utilized by the requesting physician.  No radiographic  interpretation.   Complexity Note: Imaging results reviewed. Results shared with Mr. Youkhana, using Layman's terms.                         Meds   Current Outpatient Medications:  .  acetaminophen (TYLENOL) 500 MG tablet, Take 1,000 mg by mouth as needed. , Disp: , Rfl:  .  aspirin EC 81 MG tablet, Take 1,000 mg 2 (two) times daily by mouth. , Disp: , Rfl:  .  budesonide (ENTOCORT EC) 3 MG 24 hr capsule, Take 3 mg by mouth daily. , Disp: , Rfl:  .  buPROPion (WELLBUTRIN SR) 150 MG 12 hr tablet, TAKE ONE TABLET BY MOUTH TWICE A DAY, Disp: 180 tablet, Rfl: 0 .  cephALEXin (KEFLEX) 500 MG capsule, Take 1 capsule (500 mg total) by mouth 4 (four) times daily for 7 days., Disp: 28 capsule, Rfl: 0 .  diphenoxylate-atropine (LOMOTIL) 2.5-0.025 MG tablet, TAKE ONE TABLET BY MOUTH FOUR TIMES A DAY AS NEEDED FOR DIARRHEA, Disp: , Rfl:  .  divalproex (DEPAKOTE ER) 250 MG 24 hr tablet, Take 250 mg by mouth 2 (two) times daily. Pt takes 256m in the morning, and 256min the afternoon, Disp: , Rfl:  .  donepezil (ARICEPT) 10 MG tablet, Take 10 mg by mouth at bedtime., Disp: , Rfl:  .  doxycycline (VIBRA-TABS) 100 MG tablet, Take 1 tablet (100 mg total) by mouth 2 (two) times daily., Disp: 20 tablet, Rfl: 0 .   fenofibrate (TRICOR) 145 MG tablet, TAKE ONE TABLET BY MOUTH DAILY, Disp: 90 tablet, Rfl: 0 .  finasteride (PROSCAR) 5 MG tablet, Take 1 tablet (5 mg total) by mouth daily., Disp: 30 tablet, Rfl: 11 .  gabapentin (NEURONTIN) 600 MG tablet, TAKE TWO TABLETS BY MOUTH THREE TIMES A DAY, Disp: 180 tablet, Rfl: 0 .  glimepiride (AMARYL) 4 MG tablet, Take 1 tablet (4 mg total) by mouth 2 (two) times daily., Disp: 180 tablet, Rfl: 3 .  lisinopril-hydrochlorothiazide (PRINZIDE,ZESTORETIC) 20-25 MG tablet, TAKE ONE TABLET BY MOUTH TWICE A DAY, Disp: 180 tablet, Rfl: 1 .  MELATONIN PO, Take 20 mg by mouth at bedtime., Disp: , Rfl:  .  memantine (NAMENDA) 10 MG tablet, Take 10 mg by mouth 2 (two) times daily. , Disp: , Rfl:  .  metFORMIN (GLUCOPHAGE) 500 MG tablet, TAKE TWO TABLETS BY MOUTH TWICE A DAY WITH A MEAL, Disp: 360 tablet, Rfl: 1 .  metoprolol succinate (TOPROL-XL) 25 MG 24 hr tablet, Take 25 mg by mouth daily. , Disp: , Rfl:  .  NAPROXEN SODIUM ER PO, Take 200 mg by mouth daily., Disp: , Rfl:  .  omeprazole (PRILOSEC) 20 MG capsule, 20 mg 2 (two) times daily before a meal. , Disp: , Rfl:  .  oxyCODONE-acetaminophen (PERCOCET) 7.5-325 MG tablet, Take 1 tablet by mouth 3 (three) times daily., Disp: 100 tablet, Rfl: 0 .  primidone (MYSOLINE) 50 MG tablet, TAKE 5 TABLETS IN THE MORNING AND 3 TABLETS IN THE EVENING, Disp: , Rfl:  .  propranolol (INDERAL) 60 MG tablet, Take 60 mg by mouth 2 (two) times daily. , Disp: , Rfl:  .  QUEtiapine (SEROQUEL) 25 MG tablet, TAKE THREE TO FOUR TABLETS BY MOUTH AT NIGHT, Disp: , Rfl:  .  simvastatin (ZOCOR) 40 MG tablet, TAKE 1  TABLET (40 MG TOTAL) BY MOUTH DAILY., Disp: 90 tablet, Rfl: 0 .  sitaGLIPtin (JANUVIA) 100 MG tablet, Take 1 tablet (100 mg total) by mouth daily., Disp: 30 tablet, Rfl: 2 .  tamsulosin (FLOMAX) 0.4 MG CAPS capsule, Take 1 capsule (0.4 mg total) by mouth daily after supper., Disp: 90 capsule, Rfl: 3 .  aspirin 500 MG tablet, Take 1,000 mg 2  (two) times daily by mouth., Disp: , Rfl:  .  benzonatate (TESSALON) 100 MG capsule, Take 1 capsule (100 mg total) by mouth 3 (three) times daily. (Patient not taking: Reported on 06/05/2017), Disp: 20 capsule, Rfl: 0 .  dicyclomine (BENTYL) 10 MG capsule, Take 10 mg by mouth 2 (two) times daily., Disp: , Rfl:   ROS  Constitutional: Denies any fever or chills Gastrointestinal: No reported hemesis, hematochezia, vomiting, or acute GI distress Musculoskeletal: Denies any acute onset joint swelling, redness, loss of ROM, or weakness Neurological: No reported episodes of acute onset apraxia, aphasia, dysarthria, agnosia, amnesia, paralysis, loss of coordination, or loss of consciousness  Allergies  Mr. Winner is allergic to no known allergies.  PFSH  Drug: Mr. Vaneaton  has no drug history on file. Alcohol:  has no alcohol history on file. Tobacco:  reports that he quit smoking about 11 months ago. His smoking use included cigarettes. He has a 30.00 pack-year smoking history. he has never used smokeless tobacco. Medical:  has a past medical history of Anxiety, Anxiety and depression, Arthritis, Colon polyps, Dementia, Depression, Diabetes mellitus type 2 in nonobese ([place]), GERD (gastroesophageal reflux disease), Headache, 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[place]), History of hiatal hernia, Hypercholesteremia, Hypertension, Hyperthyroidism, Neuropathic pain, Primary localized osteoarthritis of right knee, Stomach ulcer, and Tremor, essential. Surgical: Mr. Doro  has a past surgical history that includes esophageal stretch; Tonsillectomy; Total knee arthroplasty (Right, 11/15/2014); Joint replacement; Lumbar laminectomy/decompression microdiscectomy (Left, 02/03/2016); and Colonoscopy with propofol (N/A, 09/14/2016). Family: family history includes Depression in his father; Heart attack in his mother; Heart disease in his mother; Hypertension in his father.  Constitutional Exam  General appearance: Well  nourished, well developed, and well hydrated. In no apparent acute distress Vitals:   07/29/17 0815  BP: (!) 135/50  Pulse: 81  Resp: 16  Temp: 98.1 F (36.7 C)  TempSrc: Oral  SpO2: 95%  Weight: 175 lb (79.4 kg)  Height: _0  (1.803 m)   BMI Assessment: Estimated body mass index is 24.41 kg/m as calculated from the following:   Height as of this encounter: _1  (1.803 m).   Weight as of this encounter: 175 lb (79.4 kg).  BMI interpretation table: BMI level Category Range association with higher incidence of chronic pain  <18 kg/m2 Underweight   18.5-24.9 kg/m2 Ideal body weight   25-29.9 kg/m2 Overweight Increased incidence by 20%  30-34.9 kg/m2 Obese (Class I) Increased incidence by 68%  35-39.9 kg/m2 Severe obesity (Class II) Increased incidence by 136%  >40 kg/m2 Extreme obesity (Class III) Increased incidence by 254%   BMI Readings from Last 4 Encounters:  07/29/17 24.41 kg/m  07/22/17 24.97 kg/m  06/05/17 24.61 kg/m  05/27/17 25.48 kg/m   Wt Readings from Last 4 Encounters:  07/29/17 175 lb (79.4 kg)  07/22/17 174 lb (78.9 kg)  06/05/17 174 lb (78.9 kg)  05/27/17 177 lb 9.6 oz (80.6 kg)  Psych/Mental status: Alert, oriented x 3 (person, place, & time)       Eyes: PERLA Respiratory: No evidence of acute respiratory distress  Cervical Spine Area Exam  Skin & Axial Inspection: No masses, redness, edema, swelling, or associated skin lesions Alignment: Symmetrical Functional ROM: Unrestricted ROM      Stability: No instability detected Muscle Tone/Strength: Functionally intact. No obvious neuro-muscular anomalies detected. Sensory (Neurological): Unimpaired Palpation: No palpable anomalies              Upper Extremity (UE) Exam    Side: Right upper extremity  Side: Left upper extremity  Skin & Extremity Inspection: Skin color, temperature, and hair growth are WNL. No peripheral edema or cyanosis. No masses, redness, swelling, asymmetry, or associated skin  lesions. No contractures.  Skin & Extremity Inspection: Skin color, temperature, and hair growth are WNL. No peripheral edema or cyanosis. No masses, redness, swelling, asymmetry, or associated skin lesions. No contractures.  Functional ROM: Unrestricted ROM          Functional ROM: Unrestricted ROM          Muscle Tone/Strength: Functionally intact. No obvious neuro-muscular anomalies detected.  Muscle Tone/Strength: Functionally intact. No obvious neuro-muscular anomalies detected.  Sensory (Neurological): Unimpaired          Sensory (Neurological): Unimpaired          Palpation: No palpable anomalies              Palpation: No palpable anomalies              Specialized Test(s): Deferred         Specialized Test(s): Deferred          Thoracic Spine Area Exam  Skin & Axial Inspection: No masses, redness, or swelling Alignment: Symmetrical Functional ROM: Unrestricted ROM Stability: No instability detected Muscle Tone/Strength: Functionally intact. No obvious neuro-muscular anomalies detected. Sensory (Neurological): Unimpaired Muscle strength & Tone: No palpable anomalies  Lumbar Spine Area Exam  Skin & Axial Inspection: No masses, redness, or swelling Alignment: Symmetrical Functional ROM: Unrestricted ROM      Stability: No instability detected Muscle Tone/Strength: Functionally intact. No obvious neuro-muscular anomalies detected. Sensory (Neurological): Neuropathic pain pattern Palpation: No palpable anomalies       Provocative Tests: Lumbar Hyperextension and rotation test: evaluation deferred today       Lumbar Lateral bending test: evaluation deferred today       Patrick's Maneuver: evaluation deferred today                   Spinal cord stimulator leads removed with lead tips intact.  Insertion and removal site clean, dry, not erythematous, not painful. Gait & Posture Assessment  Ambulation: Unassisted Gait: Relatively normal for age and body habitus Posture: WNL   Lower  Extremity Exam    Side: Right lower extremity  Side: Left lower extremity  Skin & Extremity Inspection: Skin color, temperature, and hair growth are WNL. No peripheral edema or cyanosis. No masses, redness, swelling, asymmetry, or associated skin lesions. No contractures.  Skin & Extremity Inspection: Skin color, temperature, and hair growth are WNL. No peripheral edema or cyanosis. No masses, redness, swelling, asymmetry, or associated skin lesions. No contractures.  Functional ROM: Unrestricted ROM          Functional ROM: Decreased ROM        For left ankle  Muscle Tone/Strength: Functionally intact. No obvious neuro-muscular anomalies detected.  Muscle Tone/Strength: Functionally intact. No obvious neuro-muscular anomalies detected.  Sensory (Neurological): Unimpaired  Sensory (Neurological): Arthropathic arthralgia  Palpation: No palpable anomalies  Palpation: Complains of area being tender to palpation   Assessment  Primary Diagnosis &  Pertinent Problem List: Diagnoses of Failed back surgical syndrome, Chronic left-sided low back pain with left-sided sciatica, Lumbosacral radiculopathy, Primary osteoarthritis of left ankle, Chronic pain syndrome, and Sciatica of left side associated with disorder of lumbar spine were pertinent to this visit.  Status Diagnosis  Responding Responding Responding 1. Failed back surgical syndrome   2. Chronic left-sided low back pain with left-sided sciatica   3. Lumbosacral radiculopathy   4. Primary osteoarthritis of left ankle   5. Chronic pain syndrome   6. Sciatica of left side associated with disorder of lumbar spine      78 year old male with a history of axial low back pain predominantly left leg pain secondary to failed back surgical syndrome and chronic left-sided lumbosacral radiculopathy status post NEVRO 7 days percutaneous spinal cord stimulator trial.   Patient states that he noticed the greatest amount of pain relief in terms of his left leg  radicular pain on program 2.  Patient continues to endorse left ankle pain and intermittent left hip pain.  His left ankle pain is most likely secondary to left ankle osteoarthritis.  Patient does note a difference of approximately 60-70% in his left lower extremity neuropathic/radicular pain during the duration of the trial.  He states that he experienced pain relief, greater ease of doing activities of daily living, improvement in his functional status.  Patient was able to walk for an extended period of time without having as much pain.  Patient states that when he also sat and was stationary, his pain in his left leg was significantly better which was not the case prior to his spinal cord stimulator trial.  Even after resting, his left leg pain would be persistent.  Plan:  -I will refer the patient to Dr. Rennis Harding with [facility] for evaluation of permanent implant of percutaneous Nevro SCS.   -In regards to the patient's persistent, chronic left ankle pain that may be related to osteoarthritis or ligamentous injury, recommend MRI of left ankle.  Patient's previous x-rays of his left ankle and foot in the past have been negative.  Continues to have persistent pain but specifically with inversion and eversion of his ankle.    Plan of Care   Lab-work, procedure(s), and/or referral(s): Orders Placed This Encounter  Procedures  . MR ANKLE LEFT WO CONTRAST  . Ambulatory referral to Spine Surgery    Provider-requested follow-up: Return in about 4 weeks (around 08/26/2017) for Medication Management, After Imaging. Time Note: Greater than 50% of the 25 minute(s) of face-to-face time spent with Mr. Camacho, was spent in counseling/coordination of care regarding: permanent SCS implant, the treatment plan, the results, interpretation and significance of  his recent diagnostic interventional treatment(s) and realistic expectations. Future Appointments  Date Time Provider [place]  08/26/2017   1:30 PM [name], MD [facility] None  11/15/2017  4:00 PM O'Brien-Blaney, Bryson Corona, LPN [facility]  07/12/9796  9:30 AM [facility]    Primary Care Physician: Leone Haven, MD Location: [facility] Outpatient Pain Management Facility Note by: [name], M.D Date: 07/29/2017; Time: 9:57 AM  Patient Instructions  Dr Rennis Harding [email or web address] [phone]

## 2017-07-30 ENCOUNTER — Telehealth: Payer: Self-pay | Admitting: *Deleted

## 2017-07-30 ENCOUNTER — Telehealth: Payer: Self-pay | Admitting: Pharmacist

## 2017-07-30 NOTE — Patient Outreach (Signed)
[facility]) Care Management  07/30/2017  James Hood 02-04-1941 [id number]  77 y.o. year old male referred to [place] for Medication Assistance (Pharmacy telephone f/u)  Called to follow up with [facility] regarding Januvia patient assistance application   Patient confirms identity with HIPAA-identifiers x2 and gives verbal consent to speak over the phone about medications.   Patient approved via [place] for Januvia effective 07/29/2017 through 05/20/2018. Patient to call if he has any issues receiving orders. Provided patient with my phone number. Will close case.   [name], Pharm.D., BCPS PGY2 Ambulatory Care Pharmacy Resident Phone: [phone]

## 2017-08-01 ENCOUNTER — Other Ambulatory Visit: Payer: Self-pay | Admitting: Family Medicine

## 2017-08-05 ENCOUNTER — Other Ambulatory Visit: Payer: Self-pay | Admitting: Family Medicine

## 2017-08-05 ENCOUNTER — Other Ambulatory Visit: Payer: Self-pay | Admitting: Student in an Organized Health Care Education/Training Program

## 2017-08-05 DIAGNOSIS — M961 Postlaminectomy syndrome, not elsewhere classified: Secondary | ICD-10-CM

## 2017-08-05 NOTE — Progress Notes (Signed)
Referral placed for Dr Elayne Guerin for Nevro Paddle SCS implant at T9/T10. Patient had successful percutaneous trial for his left lower extremity radicular pain secondary to persistent left lumbar radiculopathy, lumbar postlaminectomy pain syndrome, failed back surgical syndrome.  Orders Placed This Encounter  Procedures  . Ambulatory referral to Neurosurgery    Referral Priority:   Routine    Referral Type:   Surgical    Referral Reason:   Specialty Services Required    Referred to Provider:   Meade Maw, MD    Requested Specialty:   Neurosurgery    Number of Visits Requested:   1

## 2017-08-06 DIAGNOSIS — E785 Hyperlipidemia, unspecified: Secondary | ICD-10-CM | POA: Diagnosis not present

## 2017-08-06 DIAGNOSIS — Z9889 Other specified postprocedural states: Secondary | ICD-10-CM | POA: Diagnosis not present

## 2017-08-06 DIAGNOSIS — I10 Essential (primary) hypertension: Secondary | ICD-10-CM | POA: Diagnosis not present

## 2017-08-06 DIAGNOSIS — Z7982 Long term (current) use of aspirin: Secondary | ICD-10-CM | POA: Diagnosis not present

## 2017-08-06 DIAGNOSIS — E119 Type 2 diabetes mellitus without complications: Secondary | ICD-10-CM | POA: Diagnosis not present

## 2017-08-06 DIAGNOSIS — Q402 Other specified congenital malformations of stomach: Secondary | ICD-10-CM | POA: Diagnosis not present

## 2017-08-06 DIAGNOSIS — F17200 Nicotine dependence, unspecified, uncomplicated: Secondary | ICD-10-CM | POA: Diagnosis not present

## 2017-08-06 DIAGNOSIS — K2270 Barrett's esophagus without dysplasia: Secondary | ICD-10-CM | POA: Diagnosis not present

## 2017-08-06 DIAGNOSIS — Z794 Long term (current) use of insulin: Secondary | ICD-10-CM | POA: Diagnosis not present

## 2017-08-06 DIAGNOSIS — K219 Gastro-esophageal reflux disease without esophagitis: Secondary | ICD-10-CM | POA: Diagnosis not present

## 2017-08-06 DIAGNOSIS — M1990 Unspecified osteoarthritis, unspecified site: Secondary | ICD-10-CM | POA: Diagnosis not present

## 2017-08-06 DIAGNOSIS — Z79899 Other long term (current) drug therapy: Secondary | ICD-10-CM | POA: Diagnosis not present

## 2017-08-06 DIAGNOSIS — K449 Diaphragmatic hernia without obstruction or gangrene: Secondary | ICD-10-CM | POA: Diagnosis not present

## 2017-08-06 DIAGNOSIS — Z09 Encounter for follow-up examination after completed treatment for conditions other than malignant neoplasm: Secondary | ICD-10-CM | POA: Diagnosis not present

## 2017-08-06 DIAGNOSIS — Z96651 Presence of right artificial knee joint: Secondary | ICD-10-CM | POA: Diagnosis not present

## 2017-08-08 DIAGNOSIS — M961 Postlaminectomy syndrome, not elsewhere classified: Secondary | ICD-10-CM | POA: Diagnosis not present

## 2017-08-13 ENCOUNTER — Ambulatory Visit
Admission: RE | Admit: 2017-08-13 | Discharge: 2017-08-13 | Disposition: A | Discharge status: Home / Self Care | Payer: PPO | Source: Ambulatory Visit | Attending: Student in an Organized Health Care Education/Training Program | Admitting: Student in an Organized Health Care Education/Training Program

## 2017-08-13 DIAGNOSIS — S86312A Strain of muscle(s) and tendon(s) of peroneal muscle group at lower leg level, left leg, initial encounter: Secondary | ICD-10-CM | POA: Insufficient documentation

## 2017-08-13 DIAGNOSIS — R9389 Abnormal findings on diagnostic imaging of other specified body structures: Secondary | ICD-10-CM | POA: Insufficient documentation

## 2017-08-13 DIAGNOSIS — M19072 Primary osteoarthritis, left ankle and foot: Secondary | ICD-10-CM | POA: Diagnosis present

## 2017-08-13 DIAGNOSIS — X58XXXA Exposure to other specified factors, initial encounter: Secondary | ICD-10-CM | POA: Insufficient documentation

## 2017-08-13 DIAGNOSIS — M25572 Pain in left ankle and joints of left foot: Secondary | ICD-10-CM | POA: Diagnosis not present

## 2017-08-15 ENCOUNTER — Other Ambulatory Visit: Payer: Self-pay

## 2017-08-15 ENCOUNTER — Encounter
Admission: RE | Admit: 2017-08-15 | Discharge: 2017-08-15 | Disposition: A | Discharge status: Home / Self Care | Payer: PPO | Source: Ambulatory Visit | Attending: Neurosurgery | Admitting: Neurosurgery

## 2017-08-15 ENCOUNTER — Other Ambulatory Visit: Payer: Self-pay | Admitting: Internal Medicine

## 2017-08-15 DIAGNOSIS — Z01810 Encounter for preprocedural cardiovascular examination: Secondary | ICD-10-CM | POA: Insufficient documentation

## 2017-08-15 DIAGNOSIS — I10 Essential (primary) hypertension: Secondary | ICD-10-CM | POA: Diagnosis not present

## 2017-08-15 DIAGNOSIS — R001 Bradycardia, unspecified: Secondary | ICD-10-CM | POA: Insufficient documentation

## 2017-08-15 DIAGNOSIS — E119 Type 2 diabetes mellitus without complications: Secondary | ICD-10-CM | POA: Insufficient documentation

## 2017-08-15 DIAGNOSIS — Z01812 Encounter for preprocedural laboratory examination: Secondary | ICD-10-CM | POA: Diagnosis not present

## 2017-08-15 HISTORY — DX: Benign prostatic hyperplasia without lower urinary tract symptoms: N40.0

## 2017-08-15 HISTORY — DX: Other chronic pain: G89.29

## 2017-08-15 HISTORY — DX: Lumbago with sciatica, left side: M54.42

## 2017-08-15 LAB — CBC WITH DIFFERENTIAL/PLATELET
Basophils Absolute: 0 10*3/uL (ref 0–0.1)
Basophils Relative: 0 %
Eosinophils Absolute: 0.1 10*3/uL (ref 0–0.7)
Eosinophils Relative: 1 %
HCT: 34.4 % — ABNORMAL LOW (ref 40.0–52.0)
Hemoglobin: 11.4 g/dL — ABNORMAL LOW (ref 13.0–18.0)
Lymphocytes Relative: 24 %
Lymphs Abs: 2.2 10*3/uL (ref 1.0–3.6)
MCH: 31.9 pg (ref 26.0–34.0)
MCHC: 33.3 g/dL (ref 32.0–36.0)
MCV: 95.8 fL (ref 80.0–100.0)
Monocytes Absolute: 0.7 10*3/uL (ref 0.2–1.0)
Monocytes Relative: 8 %
Neutro Abs: 6.1 10*3/uL (ref 1.4–6.5)
Neutrophils Relative %: 67 %
Platelets: 273 10*3/uL (ref 150–440)
RBC: 3.59 MIL/uL — ABNORMAL LOW (ref 4.40–5.90)
RDW: 14 % (ref 11.5–14.5)
WBC: 9.2 10*3/uL (ref 3.8–10.6)

## 2017-08-15 LAB — BASIC METABOLIC PANEL
Anion gap: 12 (ref 5–15)
BUN: 30 mg/dL — ABNORMAL HIGH (ref 6–20)
CO2: 24 mmol/L (ref 22–32)
Calcium: 9.2 mg/dL (ref 8.9–10.3)
Chloride: 102 mmol/L (ref 101–111)
Creatinine, Ser: 0.94 mg/dL (ref 0.61–1.24)
GFR calc Af Amer: >60 mL/min (ref >60)
GFR calc non Af Amer: >60 mL/min (ref >60)
Glucose, Bld: 105 mg/dL — ABNORMAL HIGH (ref 65–99)
Potassium: 4.4 mmol/L (ref 3.5–5.1)
Sodium: 138 mmol/L (ref 135–145)

## 2017-08-15 LAB — URINALYSIS, ROUTINE W REFLEX MICROSCOPIC
Bilirubin Urine: NEGATIVE
Glucose, UA: NEGATIVE mg/dL
Hgb urine dipstick: NEGATIVE
Ketones, ur: NEGATIVE mg/dL
Leukocytes, UA: NEGATIVE
Nitrite: NEGATIVE
Protein, ur: NEGATIVE mg/dL
Specific Gravity, Urine: 1.019 (ref 1.005–1.030)
pH: 5 (ref 5.0–8.0)

## 2017-08-15 LAB — SURGICAL PCR SCREEN
MRSA, PCR: NEGATIVE
Staphylococcus aureus: NEGATIVE

## 2017-08-15 LAB — APTT: aPTT: 34 seconds (ref 24–36)

## 2017-08-15 LAB — PROTIME-INR
INR: 0.92
Prothrombin Time: 12.3 seconds (ref 11.4–15.2)

## 2017-08-15 NOTE — Patient Instructions (Signed)
Your procedure is scheduled on: Wednesday, April 3RD  Report to [facility]  To find out your arrival time please call [phone] between 1PM - 3PM            on Tuesday, April 2ND  Remember: Instructions that are not followed completely may result in serious  medical risk, up to and including death, or upon the discretion of your surgeon  and anesthesiologist your surgery may need to be rescheduled.     _X__ 1. Do not eat food after midnight the night before your procedure.                 No gum chewing, lozengers, tic tacs or hard candies.                   You may drink clear liquids up to 2 hours                 before you are scheduled to arrive for your surgery-                  DO not drink clear liquids within 2 hours of the start of your surgery.                  Clear Liquids include:  water, apple juice without pulp, clear carbohydrate                 drink such as Clearfast of Gatorade, Black Coffee or Tea (Do not add                 anything to coffee or tea).  __X__2.  On the morning of surgery brush your teeth with toothpaste and water,                    you may rinse your mouth with mouthwash if you wish.                       Do not swallow any toothpaste of mouthwash.     _X__ 3.  No Alcohol for 24 hours before or after surgery.   _X__ 4.  Do Not Smoke or use e-cigarettes For 24 Hours Prior to Your Surgery.                 Do not use any chewable tobacco products for at least 6 hours prior to                 surgery.  ____  5.  Bring all medications with you on the day of surgery if instructed.   ____  6.  Notify your doctor if there is any change in your medical condition      (cold, fever, infections).     Do not wear jewelry, make-up, hairpins, clips or nail polish. Do not wear lotions, powders, or perfumes. You may wear deodorant. Do not shave 48 hours prior to surgery. Men may shave face and  neck. Do not bring valuables to the hospital.    [facility] is not responsible for any belongings or valuables.  Contacts, dentures or bridgework may not be worn into surgery. Leave your suitcase in the car. After surgery it may be brought to your room. For patients admitted to the hospital, discharge time is determined by your treatment team.   Patients discharged the day of surgery will not be allowed to drive home.  Please read over the following fact sheets that you were given:   PREPARING FOR SURGERY   ____ Take these medicines the morning of surgery with A SIP OF WATER:    1. WELLBUTRIN  2. BENTYL  3. DEPAKOTE  4. TRICOR  5. PROSCAR  6. GABAPENTIN             7.  NAMENDA             8. OMEPRAZOLE             9. PRIMIDONE            10.FLOMAX            11.PERCOCET IF ANY OF THESE MEDICATIONS IS TYPICALLY A NIGHT TIME PILL ONLY,    PLEASE DO NOT TAKE ON THE MORNING OF SURGERY  ____ Fleet Enema (as directed)   __X__ Use CHG Soap as directed  ____ Use inhalers on the day of surgery  __X__ Stop metformin 2 days prior to surgery. LAST DOSE ON MARCH 31    _X___ Stop ALL ASPIRIN PRODUCTS TODAY!!  _X_ Stop Anti-inflammatories NOW!!              THIS INCLUDES NAPROSYN / IBUPROFEN / MOTRIN /ADVIL / ALEVE                 MOBIC   _X___ Stop supplements until after surgery.                  THIS INCLUDES VITAMIN C AND FERROUS SULFATE  ____ Bring C-Pap to the hospital.   CONTINUE TAKING PRINZIDE                                   METOPROLOL                                   ARICEPT                                   LOMOTIL                                   SEROQUEL                                   JANUVIA                                   ZOCOR             DO NOT TAKE THESE ON THE MORNING OF SURGERY IF METOPROLOL IS A MORNING DRUG, PLEASE DO TAKE IT ON DAY OF SURGERY  WEAR LOOSE FITTING PANTS ON DAY OF  SURGERY

## 2017-08-21 ENCOUNTER — Ambulatory Visit: Payer: PPO | Admitting: Anesthesiology

## 2017-08-21 ENCOUNTER — Ambulatory Visit: Payer: PPO

## 2017-08-21 ENCOUNTER — Encounter
Admission: RE | Disposition: A | Discharge status: Home / Self Care | Payer: Self-pay | Source: Ambulatory Visit | Attending: Neurosurgery

## 2017-08-21 ENCOUNTER — Encounter: Payer: Self-pay | Admitting: *Deleted

## 2017-08-21 ENCOUNTER — Ambulatory Visit
Admission: RE | Admit: 2017-08-21 | Discharge: 2017-08-21 | Disposition: A | Discharge status: Home / Self Care | Payer: PPO | Source: Ambulatory Visit | Attending: Neurosurgery | Admitting: Neurosurgery

## 2017-08-21 DIAGNOSIS — K219 Gastro-esophageal reflux disease without esophagitis: Secondary | ICD-10-CM | POA: Insufficient documentation

## 2017-08-21 DIAGNOSIS — M1711 Unilateral primary osteoarthritis, right knee: Secondary | ICD-10-CM | POA: Diagnosis not present

## 2017-08-21 DIAGNOSIS — E7800 Pure hypercholesterolemia, unspecified: Secondary | ICD-10-CM | POA: Diagnosis not present

## 2017-08-21 DIAGNOSIS — Z7982 Long term (current) use of aspirin: Secondary | ICD-10-CM | POA: Diagnosis not present

## 2017-08-21 DIAGNOSIS — E119 Type 2 diabetes mellitus without complications: Secondary | ICD-10-CM | POA: Insufficient documentation

## 2017-08-21 DIAGNOSIS — Z969 Presence of functional implant, unspecified: Secondary | ICD-10-CM | POA: Diagnosis not present

## 2017-08-21 DIAGNOSIS — F418 Other specified anxiety disorders: Secondary | ICD-10-CM | POA: Insufficient documentation

## 2017-08-21 DIAGNOSIS — Z7984 Long term (current) use of oral hypoglycemic drugs: Secondary | ICD-10-CM | POA: Insufficient documentation

## 2017-08-21 DIAGNOSIS — M961 Postlaminectomy syndrome, not elsewhere classified: Secondary | ICD-10-CM | POA: Insufficient documentation

## 2017-08-21 DIAGNOSIS — F17210 Nicotine dependence, cigarettes, uncomplicated: Secondary | ICD-10-CM | POA: Insufficient documentation

## 2017-08-21 DIAGNOSIS — E785 Hyperlipidemia, unspecified: Secondary | ICD-10-CM | POA: Diagnosis not present

## 2017-08-21 DIAGNOSIS — I10 Essential (primary) hypertension: Secondary | ICD-10-CM | POA: Diagnosis not present

## 2017-08-21 DIAGNOSIS — Z419 Encounter for procedure for purposes other than remedying health state, unspecified: Secondary | ICD-10-CM

## 2017-08-21 DIAGNOSIS — Z79899 Other long term (current) drug therapy: Secondary | ICD-10-CM | POA: Diagnosis not present

## 2017-08-21 HISTORY — PX: PULSE GENERATOR IMPLANT: SHX5370

## 2017-08-21 LAB — GLUCOSE, CAPILLARY
Glucose-Capillary: 142 mg/dL — ABNORMAL HIGH (ref 65–99)
Glucose-Capillary: 128 mg/dL — ABNORMAL HIGH (ref 65–99)

## 2017-08-21 SURGERY — UNILATERAL PULSE GENERATOR IMPLANT
Anesthesia: General | Site: Spine Lumbar | Wound class: "Clean "

## 2017-08-21 MED ORDER — MIDAZOLAM HCL 2 MG/2ML IJ SOLN
INTRAMUSCULAR | Status: DC | PRN
  Administered 2017-08-21: 1 mg via INTRAVENOUS

## 2017-08-21 MED ORDER — SUCCINYLCHOLINE CHLORIDE 20 MG/ML IJ SOLN
INTRAMUSCULAR | Status: DC | PRN
  Administered 2017-08-21: 20 mg via INTRAVENOUS
  Administered 2017-08-21: 40 mg via INTRAVENOUS
  Administered 2017-08-21: 120 mg via INTRAVENOUS

## 2017-08-21 MED ORDER — BUPIVACAINE LIPOSOME 1.3 % IJ SUSP
INTRAMUSCULAR | Status: AC
  Filled 2017-08-21: qty 20

## 2017-08-21 MED ORDER — SODIUM CHLORIDE 0.9 % IR SOLN
Status: DC | PRN
  Administered 2017-08-21: 1000 mL

## 2017-08-21 MED ORDER — ROCURONIUM BROMIDE 50 MG/5ML IV SOLN
INTRAVENOUS | Status: AC
  Filled 2017-08-21: qty 1

## 2017-08-21 MED ORDER — BUPIVACAINE-EPINEPHRINE (PF) 0.5% -1:200000 IJ SOLN
INTRAMUSCULAR | Status: DC | PRN
  Administered 2017-08-21: 4 mL

## 2017-08-21 MED ORDER — BUPIVACAINE-EPINEPHRINE (PF) 0.5% -1:200000 IJ SOLN
INTRAMUSCULAR | Status: AC
  Filled 2017-08-21: qty 30

## 2017-08-21 MED ORDER — ONDANSETRON HCL 4 MG/2ML IJ SOLN: 4.0000 mg | Freq: Once | INTRAMUSCULAR | Status: DC | PRN

## 2017-08-21 MED ORDER — LIDOCAINE HCL (PF) 2 % IJ SOLN
INTRAMUSCULAR | Status: AC
  Filled 2017-08-21: qty 10

## 2017-08-21 MED ORDER — LACTATED RINGERS IV SOLN
INTRAVENOUS | Status: DC | PRN
  Administered 2017-08-21: 07:00:00 via INTRAVENOUS

## 2017-08-21 MED ORDER — DEXAMETHASONE SODIUM PHOSPHATE 10 MG/ML IJ SOLN
INTRAMUSCULAR | Status: AC
  Filled 2017-08-21: qty 1

## 2017-08-21 MED ORDER — SODIUM CHLORIDE FLUSH 0.9 % IV SOLN
INTRAVENOUS | Status: AC
Start: 2017-08-21 — End: ?
  Filled 2017-08-21: qty 10

## 2017-08-21 MED ORDER — PROPOFOL 500 MG/50ML IV EMUL
INTRAVENOUS | Status: DC | PRN
  Administered 2017-08-21: 100 ug/kg/min via INTRAVENOUS

## 2017-08-21 MED ORDER — BACITRACIN 50000 UNITS IM SOLR
INTRAMUSCULAR | Status: AC
  Filled 2017-08-21: qty 1

## 2017-08-21 MED ORDER — SODIUM CHLORIDE 0.9 % IV SOLN
1250.0000 mg | INTRAVENOUS | Status: AC
  Administered 2017-08-21: 1250 mg via INTRAVENOUS
  Filled 2017-08-21: qty 1250

## 2017-08-21 MED ORDER — SODIUM CHLORIDE 0.9 % IV SOLN
INTRAVENOUS | Status: DC
  Administered 2017-08-21: 07:00:00 via INTRAVENOUS

## 2017-08-21 MED ORDER — MIDAZOLAM HCL 2 MG/2ML IJ SOLN
INTRAMUSCULAR | Status: AC
  Filled 2017-08-21: qty 2

## 2017-08-21 MED ORDER — METHOCARBAMOL 500 MG PO TABS: 500.0000 mg | ORAL_TABLET | Freq: Four times a day (QID) | ORAL | 0 refills | Status: DC | PRN

## 2017-08-21 MED ORDER — REMIFENTANIL HCL 1 MG IV SOLR
INTRAVENOUS | Status: DC | PRN
  Administered 2017-08-21: .2 ug/kg/min via INTRAVENOUS

## 2017-08-21 MED ORDER — FENTANYL CITRATE (PF) 100 MCG/2ML IJ SOLN
INTRAMUSCULAR | Status: DC | PRN
  Administered 2017-08-21: 50 ug via INTRAVENOUS

## 2017-08-21 MED ORDER — PHENYLEPHRINE HCL 10 MG/ML IJ SOLN
INTRAMUSCULAR | Status: DC | PRN
  Administered 2017-08-21 (×4): 100 ug via INTRAVENOUS

## 2017-08-21 MED ORDER — ROCURONIUM BROMIDE 100 MG/10ML IV SOLN
INTRAVENOUS | Status: DC | PRN
  Administered 2017-08-21: 5 mg via INTRAVENOUS

## 2017-08-21 MED ORDER — FENTANYL CITRATE (PF) 100 MCG/2ML IJ SOLN: 25.0000 ug | INTRAMUSCULAR | Status: DC | PRN

## 2017-08-21 MED ORDER — DEXAMETHASONE SODIUM PHOSPHATE 10 MG/ML IJ SOLN
INTRAMUSCULAR | Status: DC | PRN
  Administered 2017-08-21: 10 mg via INTRAVENOUS

## 2017-08-21 MED ORDER — ONDANSETRON HCL 4 MG/2ML IJ SOLN
INTRAMUSCULAR | Status: AC
  Filled 2017-08-21: qty 2

## 2017-08-21 MED ORDER — LIDOCAINE HCL (CARDIAC) 20 MG/ML IV SOLN
INTRAVENOUS | Status: DC | PRN
  Administered 2017-08-21: 80 mg via INTRAVENOUS

## 2017-08-21 MED ORDER — PROPOFOL 10 MG/ML IV BOLUS
INTRAVENOUS | Status: AC
Start: 2017-08-21 — End: ?
  Filled 2017-08-21: qty 20

## 2017-08-21 MED ORDER — CEFAZOLIN SODIUM-DEXTROSE 2-4 GM/100ML-% IV SOLN
INTRAVENOUS | Status: AC
  Filled 2017-08-21: qty 100

## 2017-08-21 MED ORDER — CEFAZOLIN SODIUM-DEXTROSE 2-4 GM/100ML-% IV SOLN
2.0000 g | INTRAVENOUS | Status: AC
  Administered 2017-08-21: 2 g via INTRAVENOUS

## 2017-08-21 MED ORDER — BUPIVACAINE HCL (PF) 0.5 % IJ SOLN
INTRAMUSCULAR | Status: AC
  Filled 2017-08-21: qty 30

## 2017-08-21 MED ORDER — THROMBIN 5000 UNITS EX SOLR
CUTANEOUS | Status: DC | PRN
  Administered 2017-08-21: 5000 [IU] via TOPICAL

## 2017-08-21 MED ORDER — SODIUM CHLORIDE 0.9 % IV SOLN
INTRAVENOUS | Status: DC | PRN
  Administered 2017-08-21: 40 mL

## 2017-08-21 MED ORDER — REMIFENTANIL HCL 1 MG IV SOLR
INTRAVENOUS | Status: AC
  Filled 2017-08-21: qty 1000

## 2017-08-21 MED ORDER — SUGAMMADEX SODIUM 200 MG/2ML IV SOLN
INTRAVENOUS | Status: AC
  Filled 2017-08-21: qty 2

## 2017-08-21 MED ORDER — SUCCINYLCHOLINE CHLORIDE 20 MG/ML IJ SOLN
INTRAMUSCULAR | Status: AC
  Filled 2017-08-21: qty 1

## 2017-08-21 MED ORDER — PHENYLEPHRINE HCL 10 MG/ML IJ SOLN
INTRAMUSCULAR | Status: DC | PRN
  Administered 2017-08-21: 25 ug/min via INTRAVENOUS

## 2017-08-21 MED ORDER — ONDANSETRON HCL 4 MG/2ML IJ SOLN
INTRAMUSCULAR | Status: DC | PRN
  Administered 2017-08-21: 4 mg via INTRAVENOUS

## 2017-08-21 MED ORDER — GLYCOPYRROLATE 0.2 MG/ML IJ SOLN
INTRAMUSCULAR | Status: DC | PRN
  Administered 2017-08-21 (×2): 0.2 mg via INTRAVENOUS

## 2017-08-21 MED ORDER — PROPOFOL 10 MG/ML IV BOLUS
INTRAVENOUS | Status: DC | PRN
  Administered 2017-08-21: 160 mg via INTRAVENOUS

## 2017-08-21 MED ORDER — OXYCODONE HCL 5 MG PO TABS: 5.0000 mg | ORAL_TABLET | Freq: Four times a day (QID) | ORAL | 0 refills | Status: DC | PRN

## 2017-08-21 MED ORDER — BUPIVACAINE HCL 0.5 % IJ SOLN
INTRAMUSCULAR | Status: DC | PRN
  Administered 2017-08-21: 20 mL

## 2017-08-21 MED ORDER — FENTANYL CITRATE (PF) 100 MCG/2ML IJ SOLN
INTRAMUSCULAR | Status: AC
  Filled 2017-08-21: qty 2

## 2017-08-21 MED ORDER — THROMBIN (RECOMBINANT) 5000 UNITS EX SOLR
CUTANEOUS | Status: AC
  Filled 2017-08-21: qty 5000

## 2017-08-21 MED ORDER — EPHEDRINE SULFATE 50 MG/ML IJ SOLN
INTRAMUSCULAR | Status: DC | PRN
  Administered 2017-08-21: 15 mg via INTRAVENOUS

## 2017-08-21 MED ORDER — ACETAMINOPHEN 10 MG/ML IV SOLN
INTRAVENOUS | Status: DC | PRN
  Administered 2017-08-21: 1000 mg via INTRAVENOUS

## 2017-08-21 MED ORDER — SODIUM CHLORIDE FLUSH 0.9 % IV SOLN
INTRAVENOUS | Status: AC
  Filled 2017-08-21: qty 20

## 2017-08-21 SURGICAL SUPPLY — 66 items
ADH SKN CLS APL DERMABOND .7 (GAUZE/BANDAGES/DRESSINGS) ×1
AGENT HMST MTR 8 SURGIFLO (HEMOSTASIS) ×1
BUR NEURO DRILL SOFT 3.0X3.8M (BURR) ×1 IMPLANT
CANISTER SUCT 1200ML W/VALVE (MISCELLANEOUS) ×2 IMPLANT
CHARGER KIT (Stimulator) ×1 IMPLANT
CHLORAPREP W/TINT 26ML (MISCELLANEOUS) ×4 IMPLANT
COUNTER NEEDLE 20/40 LG (NEEDLE) ×2 IMPLANT
COVER LIGHT HANDLE STERIS (MISCELLANEOUS) ×4 IMPLANT
CUP MEDICINE 2OZ PLAST GRAD ST (MISCELLANEOUS) ×1 IMPLANT
DERMABOND ADVANCED (GAUZE/BANDAGES/DRESSINGS) ×1
DERMABOND ADVANCED .7 DNX12 (GAUZE/BANDAGES/DRESSINGS) ×1 IMPLANT
DRAPE C-ARM 42X72 X-RAY (DRAPES) ×4 IMPLANT
DRAPE C-ARMOR (DRAPES) ×2 IMPLANT
DRAPE INCISE IOBAN 66X45 STRL (DRAPES) ×2 IMPLANT
DRAPE LAPAROTOMY 77X122 PED (DRAPES) ×2 IMPLANT
DRAPE MICROSCOPE SPINE 48X150 (DRAPES) IMPLANT
DRAPE SURG 17X11 SM STRL (DRAPES) ×8 IMPLANT
DRSG TEGADERM 4X4.75 (GAUZE/BANDAGES/DRESSINGS) ×2 IMPLANT
DRSG TELFA 4X3 1S NADH ST (GAUZE/BANDAGES/DRESSINGS) ×2 IMPLANT
ELECT CAUTERY BLADE TIP 2.5 (TIP) ×2
ELECT REM PT RETURN 9FT ADLT (ELECTROSURGICAL) ×2
ELECTRODE CAUTERY BLDE TIP 2.5 (TIP) ×1 IMPLANT
ELECTRODE REM PT RTRN 9FT ADLT (ELECTROSURGICAL) ×1 IMPLANT
EVICEL AIRLESS SPRAY ACCES (MISCELLANEOUS) IMPLANT
FEE INTRAOP MONITOR IMPULS NCS (MISCELLANEOUS) IMPLANT
GLOVE BIOGEL PI IND STRL 7.0 (GLOVE) ×1 IMPLANT
GLOVE BIOGEL PI INDICATOR 7.0 (GLOVE) ×1
GLOVE SURG SYN 6.5 ES PF (GLOVE) ×4 IMPLANT
GLOVE SURG SYN 6.5 PF PI (GLOVE) ×2 IMPLANT
GLOVE SURG SYN 8.5  E (GLOVE) ×2
GLOVE SURG SYN 8.5 E (GLOVE) ×2 IMPLANT
GLOVE SURG SYN 8.5 PF PI (GLOVE) ×2 IMPLANT
GOWN SRG XL LVL 3 NONREINFORCE (GOWNS) ×1 IMPLANT
GOWN STRL NON-REIN TWL XL LVL3 (GOWNS) ×2
GOWN STRL REUS W/ TWL LRG LVL3 (GOWN DISPOSABLE) ×1 IMPLANT
GOWN STRL REUS W/TWL LRG LVL3 (GOWN DISPOSABLE) ×2
GOWN STRL REUS W/TWL MED LVL3 (GOWN DISPOSABLE) ×2 IMPLANT
INTRAOP MONITOR FEE IMPULS NCS (MISCELLANEOUS) ×1
INTRAOP MONITOR FEE IMPULSE (MISCELLANEOUS) ×1
KIT LEAD 50CML STIMULATION (Lead) ×1 IMPLANT
KIT LEAD NEUROSTIM 50 (Lead) IMPLANT
KIT NEUROSTIMULATOR SENZA (Stimulator) ×1 IMPLANT
KIT TUNNELER SPINAL CORD 35 (KITS) IMPLANT
KIT TURNOVER KIT A (KITS) ×2 IMPLANT
MARKER SKIN DUAL TIP RULER LAB (MISCELLANEOUS) ×4 IMPLANT
NEEDLE HYPO 22GX1.5 SAFETY (NEEDLE) ×2 IMPLANT
NS IRRIG 1000ML POUR BTL (IV SOLUTION) ×2 IMPLANT
PACK LAMINECTOMY NEURO (CUSTOM PROCEDURE TRAY) ×2 IMPLANT
PATEINT REMOTE KIT (Stimulator) ×1 IMPLANT
PATIENT REMOTE KIT ×1 IMPLANT
PATIENT REMOTE KIT (Stimulator) IMPLANT
SPOGE SURGIFLO 8M (HEMOSTASIS) ×1
SPONGE SURGIFLO 8M (HEMOSTASIS) IMPLANT
STAPLER SKIN PROX 35W (STAPLE) ×1 IMPLANT
SUT DVC VLOC 90 3-0 CV23 VLT (SUTURE) ×2
SUT VIC AB 0 CT1 18XCR BRD 8 (SUTURE) IMPLANT
SUT VIC AB 0 CT1 8-18 (SUTURE) ×4
SUT VIC AB 2-0 CT1 18 (SUTURE) ×2 IMPLANT
SUTURE DVC VLC 90 3-0 CV23 VLT (SUTURE) IMPLANT
SYR 10ML LL (SYRINGE) ×4 IMPLANT
SYR 3ML LL SCALE MARK (SYRINGE) ×2 IMPLANT
SYR BULB IRRIG 60ML STRL (SYRINGE) ×2 IMPLANT
TOWEL OR 17X26 4PK STRL BLUE (TOWEL DISPOSABLE) ×6 IMPLANT
TUNNELING TOOL KIT 35 (KITS) ×1
nevro charger kit ×1 IMPLANT
tunneling tool kit 35 cm ×1 IMPLANT

## 2017-08-21 NOTE — Anesthesia Preprocedure Evaluation (Signed)
Anesthesia Evaluation  Patient identified by MRN, date of birth, ID band Patient awake    Reviewed: Allergy & Precautions, NPO status , Patient's Chart, lab work & pertinent test results, reviewed documented beta blocker date and time   History of Anesthesia Complications Negative for: history of anesthetic complications  Airway Mallampati: III       Dental   Pulmonary neg sleep apnea, neg COPD, Current Smoker,           Cardiovascular hypertension, Pt. on medications and Pt. on home beta blockers (-) Past MI and (-) CHF (-) dysrhythmias (-) Valvular Problems/Murmurs     Neuro/Psych neg Seizures Anxiety Depression Dementia    GI/Hepatic Neg liver ROS, hiatal hernia, PUD, GERD  Medicated and Controlled,  Endo/Other  diabetes, Type 2, Oral Hypoglycemic Agents  Renal/GU negative Renal ROS     Musculoskeletal   Abdominal   Peds  Hematology  (+) anemia ,   Anesthesia Other Findings   Reproductive/Obstetrics                             Anesthesia Physical Anesthesia Plan  ASA: III  Anesthesia Plan: General   Post-op Pain Management:    Induction:   PONV Risk Score and Plan:   Airway Management Planned: Nasal Cannula  Additional Equipment:   Intra-op Plan:   Post-operative Plan:   Informed Consent: I have reviewed the patients History and Physical, chart, labs and discussed the procedure including the risks, benefits and alternatives for the proposed anesthesia with the patient or authorized representative who has indicated his/her understanding and acceptance.     Plan Discussed with:   Anesthesia Plan Comments:         Anesthesia Quick Evaluation

## 2017-08-21 NOTE — Anesthesia Procedure Notes (Signed)
Performed by: Carron Curie, CRNA

## 2017-08-21 NOTE — Progress Notes (Signed)
Pharmacy note  Cefazolin 2 grams and vancomycin 1250 mg x1 ordered for surgical prophylaxis per consult.  Sim Boast, PharmD, BCPS  08/21/17 6:02 AM

## 2017-08-21 NOTE — Anesthesia Post-op Follow-up Note (Signed)
Anesthesia QCDR form completed.        

## 2017-08-21 NOTE — Anesthesia Postprocedure Evaluation (Signed)
Anesthesia Post Note  Patient: James Hood  Procedure(s) Performed: UNILATERAL PULSE GENERATOR IMPLANT (N/A Spine Lumbar)  Patient location during evaluation: PACU Anesthesia Type: General Level of consciousness: awake and alert Pain management: pain level controlled Vital Signs Assessment: post-procedure vital signs reviewed and stable Respiratory status: spontaneous breathing and respiratory function stable Cardiovascular status: stable Anesthetic complications: no     Last Vitals:  Vitals:   08/21/17 0612 08/21/17 0936  BP: 107/63 113/64  Pulse: 84 85  Resp: 14 16  Temp: 36.6 C (!) 36.1 C  SpO2: 96% 94%    Last Pain:  Vitals:   08/21/17 0612  TempSrc: Tympanic  PainSc: 6                  Otto Felkins K

## 2017-08-21 NOTE — Transfer of Care (Addendum)
Immediate Anesthesia Transfer of Care Note  Patient: James Hood  Procedure(s) Performed: UNILATERAL PULSE GENERATOR IMPLANT (N/A Spine Lumbar)  Patient Location: PACU  Anesthesia Type:General  Level of Consciousness: awake  Airway & Oxygen Therapy: Patient Spontanous Breathing  Post-op Assessment: Report given to RN  Post vital signs: stable  Last Vitals:  Vitals Value Taken Time  BP    Temp    Pulse 87 08/21/2017  9:35 AM  Resp    SpO2 95 % 08/21/2017  9:35 AM  Vitals shown include unvalidated device data.  Last Pain:  Vitals:   08/21/17 0612  TempSrc: Tympanic  PainSc: 6          Complications: No apparent anesthesia complications

## 2017-08-21 NOTE — Op Note (Signed)
Indications: Mr. Degroote is a 77 yo male who was diagnosed with post-laminectomy pain syndrome.  He had a successful trial for spinal cord stimulation, so was consented for placement of a permanent device  Findings: successful placement of a spinal cord stimulator.  Preoperative Diagnosis: Post laminectomy pain syndrome Postoperative Diagnosis: same   EBL: 100 ml IVF: 700 ml Drains: none Disposition: Extubated and Stable to PACU Complications: none  No foley catheter was placed.   Preoperative Note:   Risks of surgery discussed in clinic.  Operative Note:    The patient was then brought from the preoperative center with intravenous access established.  The patient underwent general anesthesia and endotracheal tube intubation, then was rotated on the [facility] table where all pressure points were appropriately padded.  An incision was marked with flouroscopy at T10-11, and on the buttock. The skin was then thoroughly cleansed.  Perioperative antibiotic prophylaxis was administered.  Sterile prep and drapes were then applied and a timeout was then observed.    Once this was complete an incision was opened with the use of a #10 blade knife in the midline at T10-11.  The paraspinus muscled were subperiosteally dissected until the laminae of T10 and T11 were visualized. Flouroscopy was used to confirm the level. A self-retaining retractor was placed.  The rongeur was used to remove the spinous process of T10.  The drill was used to thin the bone until the ligamentum flavum was visualized.  The ligamentum was then removed and the dura visualized. This was widened until approximately 12 mm of dura was exposed to allow for placement of the paddle lead.    The lead was then advanced to the T8/9 disc space at the top of the lead.  The lead was secured with a 2-0 silk suture. The lead was placed in a bacitracin soaked sponge.  The incision on the right buttock was then opened and a pocket formed  until it was large enough for the pulse generator.  The tunneler was used to connect between the pocket and the incision.  The lead was inserted into the tunneler and tunneled to the buttock.  The leads were attached to the IPG and impedances checked.  The leads were then tightened.    Both sites were irrigated.  The wounds were closed in layers with 0 and 2-0 vicryl.  The skin was approximated with monocryl and covered with dermabond,    Patient was then rotated back to the preoperative bed awakened from anesthesia and taken to recovery all counts are correct in this case.  I performed the entire procedure with the assistance of Marin Olp PA as an Pensions consultant.   Meade Maw MD

## 2017-08-21 NOTE — Anesthesia Procedure Notes (Signed)
Procedure Name: Intubation Date/Time: 08/21/2017 7:31 AM Performed by: Carron Curie, CRNA Pre-anesthesia Checklist: Patient identified, Patient being monitored, Timeout performed, Emergency Drugs available and Suction available Patient Re-evaluated:Patient Re-evaluated prior to induction Oxygen Delivery Method: Circle system utilized Preoxygenation: Pre-oxygenation with 100% oxygen Induction Type: IV induction and Cricoid Pressure applied Ventilation: Mask ventilation without difficulty Laryngoscope Size: Mac, 3 and McGraph (Two attempts by Junnie Loschiavo and Kephart with bougie and final success with McGraph x 1.  No heme or trauma to teeth or other oropharyngeal structures.) Grade View: Grade III Tube type: Oral Tube size: 7.5 mm Number of attempts: 4 Airway Equipment and Method: Stylet Placement Confirmation: ETT inserted through vocal cords under direct vision,  positive ETCO2 and breath sounds checked- equal and bilateral Secured at: 22 cm Tube secured with: Tape Dental Injury: Teeth and Oropharynx as per pre-operative assessment  Difficulty Due To: Difficulty was unanticipated Future Recommendations: Recommend- induction with short-acting agent, and alternative techniques readily available

## 2017-08-21 NOTE — Discharge Summary (Signed)
Patient seen in recovery following surgery. Still groggy but able to communicate well. No pain at incision sites at buttock and back.  No new presentation of pain or numbness.   Strength: 5/5 upper and lower Sensation: intact and symmetric upper and lower.   Plan:  Continue pain control with oxycodone and robaxin. Patient scheduled to follow up in 2 weeks with SCS rep present. SCS will be turned on in 7 days per rep. Discharge instructions provided. Advised patient to contact office if questions or concerns arise.   Marin Olp PA-C Dept. Of Neurosurgery

## 2017-08-21 NOTE — H&P (Signed)
I have reviewed and confirmed my history and physical from 08/08/2017 with no additions or changes. Plan for spinal cord stimulator placement.  Risks and benefits reviewed.    Heart sounds normal no MRG. Chest Clear to Auscultation Bilaterally.

## 2017-08-21 NOTE — Discharge Instructions (Addendum)
NEUROSURGERY DISCHARGE INSTRUCTIONS  Operative procedure: Spinal cord stimulator placement  The following are instructions to help in your recovery once you have been discharged from the hospital. Even if you feel well, it is important that you follow these activity guidelines.   What to do after you leave the hospital:  Recommended diet:  Increase protein intake to promote wound healing. You may return to your usual diet.  Be sure to stay hydrated.   Recommended activity: No bending, lifting, or twisting (BLT). Avoid lifting objects heavier than 10 pounds (gallon milk jug). Where possible, avoid household activities that involve lifting, bending, reaching, pushing, or pulling such as laundry, vacuuming, grocery shopping, and childcare. Try to arrange for help from friends and family for these activities while your back heals.   Increase physical activity slowly as tolerated. Taking short walks is encouraged, but avoid strenuous exercise. Do not jog, run, bicycle, lift weights, or participate in any other exercises unless specifically allowed by your doctor.   You should not drive until cleared by your doctor.   Until released by your doctor, you should not return to work or school. You should rest at home and let your body heal.   You may shower the day after your surgery. After showering, lightly dab your incision dry. Do not take a tub bath or go swimming until approved by your doctor at your follow-up appointment.   If you smoke, we strongly recommend that you quit. Smoking has been proven to interfere with normal bone healing and will dramatically reduce the success rate of your surgery. Please contact [facility] (800-QUIT-NOW) and use the resources at www.[facility].com for assistance in stopping smoking.   Medications  Do not restart Aspirin until seven days after surgery  You may restart home medications.   Wound Care Instructions  If you have a dressing on your incision,  remove it two days after your surgery. Keep your incision area clean and dry.   If you have staples or stitches on your incision, you should have a follow up scheduled for removal. If you do not have staples or stitches, you will have steri-strips (small pieces of surgical tape) or Dermabond glue. The steri-strips/glue should begin to peel away within about a week (it is fine if the steri-strips fall off before then). If the strips are still in place one week after your surgery, you may gently remove them.    Please Report any of the following: You may experience pain in your neck and/or pain between your shoulder blades. This is normal and should improve in the next few weeks with the help of pain medication, muscle relaxers, and rest. Some patients report that a warm compress on the back of the neck or between the shoulder blades helps.   However, should you experience any of the following, contact us immediately:   New numbness or weakness   Pain that is progressively getting worse, and is not relieved by your pain medication, muscle relaxers, rest, and warm compresses   Bleeding, redness, swelling, pain, or drainage from surgical incision   Chills or flu-like symptoms   Fever greater than 101.0 F (38.3 C)   Inability to eat, drink fluids, or take medications   Problems with bowel or bladder functions   Difficulty breathing or shortness of breath   Warmth, tenderness, or swelling in your calf    Additional Follow up appointments During office hours (Monday-Friday 9 am to 5 pm), please call your physician at [phone]  After  hours and weekends, please call the [facility] at  [phone] and ask for the Neurosurgeon On Call   For a life-threatening emergency, call [place]   1) The drugs that you were given will stay in your system until tomorrow so for the next 24 hours you should not:  A) Drive an  automobile B) Make any legal decisions C) Drink any alcoholic beverage   2) You may resume regular meals tomorrow.  Today it is better to start with liquids and gradually work up to solid foods.  You may eat anything you prefer, but it is better to start with liquids, then soup and crackers, and gradually work up to solid foods.   3) Please notify your doctor immediately if you have any unusual bleeding, trouble breathing, redness and pain at the surgery site, drainage, fever, or pain not relieved by medication.    4) Additional Instructions:        Please contact your physician with any problems or Same Day Surgery at [phone], Monday through Friday 6 am to 4 pm, or [facility] number at [phone].

## 2017-08-22 ENCOUNTER — Encounter: Payer: Self-pay | Admitting: Neurosurgery

## 2017-08-26 ENCOUNTER — Encounter: Payer: Self-pay | Admitting: Student in an Organized Health Care Education/Training Program

## 2017-08-26 ENCOUNTER — Ambulatory Visit
Payer: PPO | Attending: Student in an Organized Health Care Education/Training Program | Admitting: Student in an Organized Health Care Education/Training Program

## 2017-08-26 VITALS — BP 111/64 | HR 96 | Temp 97.8°F | Resp 16 | Ht 71.0 in | Wt 170.0 lb

## 2017-08-26 DIAGNOSIS — M1712 Unilateral primary osteoarthritis, left knee: Secondary | ICD-10-CM | POA: Diagnosis not present

## 2017-08-26 DIAGNOSIS — Z760 Encounter for issue of repeat prescription: Secondary | ICD-10-CM | POA: Insufficient documentation

## 2017-08-26 DIAGNOSIS — G894 Chronic pain syndrome: Secondary | ICD-10-CM | POA: Insufficient documentation

## 2017-08-26 DIAGNOSIS — M25551 Pain in right hip: Secondary | ICD-10-CM | POA: Diagnosis not present

## 2017-08-26 DIAGNOSIS — Z9889 Other specified postprocedural states: Secondary | ICD-10-CM | POA: Diagnosis not present

## 2017-08-26 DIAGNOSIS — M79605 Pain in left leg: Secondary | ICD-10-CM | POA: Diagnosis not present

## 2017-08-26 DIAGNOSIS — Z79899 Other long term (current) drug therapy: Secondary | ICD-10-CM | POA: Insufficient documentation

## 2017-08-26 DIAGNOSIS — F419 Anxiety disorder, unspecified: Secondary | ICD-10-CM | POA: Insufficient documentation

## 2017-08-26 DIAGNOSIS — Z7982 Long term (current) use of aspirin: Secondary | ICD-10-CM | POA: Diagnosis not present

## 2017-08-26 DIAGNOSIS — F329 Major depressive disorder, single episode, unspecified: Secondary | ICD-10-CM | POA: Diagnosis not present

## 2017-08-26 DIAGNOSIS — Z5181 Encounter for therapeutic drug level monitoring: Secondary | ICD-10-CM | POA: Diagnosis not present

## 2017-08-26 DIAGNOSIS — M961 Postlaminectomy syndrome, not elsewhere classified: Secondary | ICD-10-CM

## 2017-08-26 DIAGNOSIS — Z9689 Presence of other specified functional implants: Secondary | ICD-10-CM

## 2017-08-26 DIAGNOSIS — M5417 Radiculopathy, lumbosacral region: Secondary | ICD-10-CM

## 2017-08-26 DIAGNOSIS — M19072 Primary osteoarthritis, left ankle and foot: Secondary | ICD-10-CM | POA: Insufficient documentation

## 2017-08-26 DIAGNOSIS — F17210 Nicotine dependence, cigarettes, uncomplicated: Secondary | ICD-10-CM | POA: Diagnosis not present

## 2017-08-26 DIAGNOSIS — K760 Fatty (change of) liver, not elsewhere classified: Secondary | ICD-10-CM | POA: Insufficient documentation

## 2017-08-26 DIAGNOSIS — F0390 Unspecified dementia without behavioral disturbance: Secondary | ICD-10-CM | POA: Insufficient documentation

## 2017-08-26 DIAGNOSIS — E781 Pure hyperglyceridemia: Secondary | ICD-10-CM | POA: Insufficient documentation

## 2017-08-26 DIAGNOSIS — G8918 Other acute postprocedural pain: Secondary | ICD-10-CM | POA: Insufficient documentation

## 2017-08-26 DIAGNOSIS — R262 Difficulty in walking, not elsewhere classified: Secondary | ICD-10-CM | POA: Diagnosis not present

## 2017-08-26 DIAGNOSIS — G8929 Other chronic pain: Secondary | ICD-10-CM | POA: Diagnosis not present

## 2017-08-26 DIAGNOSIS — M5442 Lumbago with sciatica, left side: Secondary | ICD-10-CM | POA: Diagnosis not present

## 2017-08-26 DIAGNOSIS — M1711 Unilateral primary osteoarthritis, right knee: Secondary | ICD-10-CM | POA: Diagnosis not present

## 2017-08-26 DIAGNOSIS — Z7984 Long term (current) use of oral hypoglycemic drugs: Secondary | ICD-10-CM | POA: Insufficient documentation

## 2017-08-26 DIAGNOSIS — K219 Gastro-esophageal reflux disease without esophagitis: Secondary | ICD-10-CM | POA: Diagnosis not present

## 2017-08-26 DIAGNOSIS — E7800 Pure hypercholesterolemia, unspecified: Secondary | ICD-10-CM | POA: Insufficient documentation

## 2017-08-26 DIAGNOSIS — I10 Essential (primary) hypertension: Secondary | ICD-10-CM | POA: Insufficient documentation

## 2017-08-26 DIAGNOSIS — M545 Low back pain: Secondary | ICD-10-CM | POA: Insufficient documentation

## 2017-08-26 DIAGNOSIS — E119 Type 2 diabetes mellitus without complications: Secondary | ICD-10-CM | POA: Insufficient documentation

## 2017-08-26 HISTORY — DX: Presence of other specified functional implants: Z96.89

## 2017-08-26 MED ORDER — CYCLOBENZAPRINE HCL 10 MG PO TABS: 10.0000 mg | ORAL_TABLET | Freq: Two times a day (BID) | ORAL | 2 refills | Status: DC | PRN

## 2017-08-26 MED ORDER — OXYCODONE-ACETAMINOPHEN 7.5-325 MG PO TABS: 1.0000 | ORAL_TABLET | Freq: Three times a day (TID) | ORAL | 0 refills | Status: DC

## 2017-08-26 NOTE — Progress Notes (Signed)
Patient's Name: James Hood  MRN: [id number]  Referring Provider: Leone Haven, MD  DOB: [birth year]  PCP: Leone Haven, MD  DOS: 08/26/2017  Note by: [name], MD  Service setting: Ambulatory outpatient  Specialty: Interventional Pain Management  Location: [facility] (AMB) Pain Management Facility    Patient type: Established   Primary Reason(s) for Visit: Encounter for prescription drug management. (Level of risk: moderate)  CC: Back Pain (post surgical pain ) and Leg Pain (left)  HPI  James Hood is a 77 y.o. year old, male patient, who comes today for a medication management evaluation. He has Hypertension; Tremor, essential; Primary localized osteoarthritis of right knee; Anxiety and depression; DJD (degenerative joint disease) of knee; Trochanteric bursitis of left hip; Headache; Falls; Barrett's esophagus; Chronic lumbar pain; BPH (benign prostatic hypertrophy); Benign essential tremor; Benign neoplasm of colon; Chronic diarrhea; Type 2 diabetes mellitus ([place]); Difficulty in walking; Facial droop; HLD (hyperlipidemia); Amnesia; Testicular hypofunction; Absence of sensation; Episode of syncope; Hypertriglyceridemia; Anemia; Lumbar herniated disc; Night sweats; Right hip pain; Purpura ([place]); Thyroid nodule; Fatty liver; S/P insertion of spinal cord stimulator; Chronic pain syndrome; and Failed back surgical syndrome on their problem list. His primarily concern today is the Back Pain (post surgical pain ) and Leg Pain (left)  Pain Assessment: Location: Left Leg Radiating: na Onset: More than a month ago Duration: Chronic pain Quality: Aching, Discomfort, Constant Severity: 6 /10 (self-reported pain score)  Note: Reported level is compatible with observation.                         When using our objective Pain Scale, levels between 6 and 10/10 are said to belong in an emergency room, as it progressively worsens from a 6/10, described as severely limiting, requiring emergency care  not usually available at an outpatient pain management facility. At a 6/10 level, communication becomes difficult and requires great effort. Assistance to reach the emergency department may be required. Facial flushing and profuse sweating along with potentially dangerous increases in heart rate and blood pressure will be evident. Effect on ADL: has been in bed since this passed Wednesday recovering from SCS placement.  Timing: Constant Modifying factors: SCS placed.  medications  James Hood was last scheduled for an appointment on 07/29/2017 for medication management. During today's appointment we reviewed James Hood chronic pain status, as well as his outpatient medication regimen.  Patient presents today for follow-up status post NEVRO spinal cord stimulator paddle implant on 08/21/2017.  Patient is still recovering from his surgery.  Spinal cord stimulator will be turned on 7 days after his implant which will be this Wednesday.  Patient presents today for medication refill.  He states that his back is sore from the procedure.  Patient is also endorsing persistent left ankle pain secondary to [place] brevis tendon tear.  I recommended the patient follow-up with orthopedics after he has completed his post surgical checkup with Dr. Cari Caraway.  The patient  reports that he does not use drugs. His body mass index is 23.71 kg/m.  Further details on both, my assessment(s), as well as the proposed treatment plan, please see below.  Controlled Substance Pharmacotherapy Assessment REMS (Risk Evaluation and Mitigation Strategy)  Analgesic: 08/02/2017 1 06/05/2017 Oxycodon-Acetaminophen 7.5-325 100 33 Bi Lat [id number] Har ([id number]) 0 34.09 MME Medicare [facility]  MME/day: 34 mg/day.  James Billow, RN  08/26/2017  1:45 PM  Sign at close encounter Nursing Pain Medication Assessment:  Safety precautions to be maintained throughout the outpatient stay will include: orient to surroundings, keep bed in low  position, maintain call bell within reach at all times, provide assistance with transfer out of bed and ambulation.  Medication Inspection Compliance: James Hood did not comply with our request to bring his pills to be counted. He was reminded that bringing the medication bottles, even when empty, is a requirement.  Medication: None brought in. Pill/Patch Count: None available to be counted. Bottle Appearance: No container available. Did not bring bottle(s) to appointment. Filled Date: N/A Last Medication intake:  Yesterday   Pharmacokinetics: Liberation and absorption (onset of action): WNL Distribution (time to peak effect): WNL Metabolism and excretion (duration of action): WNL         Pharmacodynamics: Desired effects: Analgesia: [name] reports >50% benefit. Functional ability: Patient reports that medication allows him to accomplish basic ADLs Clinically meaningful improvement in function (CMIF): Sustained CMIF goals met Perceived effectiveness: Described as relatively effective, allowing for increase in activities of daily living (ADL) Undesirable effects: Side-effects or Adverse reactions: None reported Monitoring: [place] PMP: Online review of the past 57-monthperiod conducted. Compliant with practice rules and regulations Last UDS on record: No results found for: SUMMARY UDS interpretation: Compliant          Medication Assessment Form: Reviewed. Patient indicates being compliant with therapy Treatment compliance: Compliant Risk Assessment Profile: Aberrant behavior: See prior evaluations. None observed or detected today Comorbid factors increasing risk of overdose: See prior notes. No additional risks detected today Risk of substance use disorder (SUD): Low Opioid Risk Tool - 08/26/17 1344      Family History of Substance Abuse   Alcohol  Negative    Illegal Drugs  Negative    Rx Drugs  Negative      Personal History of Substance Abuse   Alcohol  Positive Male or  Male    Illegal Drugs  Negative    Rx Drugs  Negative      Psychological Disease   Psychological Disease  Positive    ADD  Negative    OCD  Negative    Bipolar  Negative    Schizophrenia  Negative    Depression  Positive      Total Score   Opioid Risk Tool Scoring  6    Opioid Risk Interpretation  Moderate Risk      ORT Scoring interpretation table:  Score <3 = Low Risk for SUD  Score between 4-7 = Moderate Risk for SUD  Score >8 = High Risk for Opioid Abuse   Risk Mitigation Strategies:  Patient Counseling: Covered Patient-Prescriber Agreement (PPA): Present and active  Notification to other healthcare providers: Done  Pharmacologic Plan: No change in therapy, at this time.             Laboratory Chemistry  Inflammation Markers (CRP: Acute Phase) (ESR: Chronic Phase) Lab Results  Component Value Date   ESRSEDRATE 11 01/29/2017                         Rheumatology Markers No results found for: RF, ANA, LABURIC, URICUR, LYMEIGGIGMAB, LVa [facility]                     Renal Function Markers Lab Results  Component Value Date   BUN 30 (H) 08/15/2017   CREATININE 0.94 08/15/2017   GFRAA >60 08/15/2017   GFRNONAA >60 08/15/2017  Hepatic Function Markers Lab Results  Component Value Date   AST 28 01/29/2017   ALT 29 01/29/2017   ALBUMIN 4.3 01/29/2017   ALKPHOS 23 (L) 01/29/2017   AMMONIA 14 08/09/2016                        Electrolytes Lab Results  Component Value Date   NA 138 08/15/2017   K 4.4 08/15/2017   CL 102 08/15/2017   CALCIUM 9.2 08/15/2017                        Neuropathy Markers Lab Results  Component Value Date   HGBA1C 6.7 (H) 02/27/2017   HIV NONREACTIVE 06/19/2016                        Bone Pathology Markers No results found for: VD25OH, [id number], OF7510CH8, NI7782UM3, 25OHVITD1, 25OHVITD2, 25OHVITD3, TESTOFREE, TESTOSTERONE                       Coagulation Parameters Lab Results  Component  Value Date   INR 0.92 08/15/2017   LABPROT 12.3 08/15/2017   APTT 34 08/15/2017   PLT 273 08/15/2017                        Cardiovascular Markers Lab Results  Component Value Date   CKTOTAL 468 (H) 08/14/2016   HGB 11.4 (L) 08/15/2017   HCT 34.4 (L) 08/15/2017                         CA Markers No results found for: CEA, CA125, LABCA2                      Note: Lab results reviewed.  Recent Diagnostic Imaging Results  DG Thoracic Spine 1 View CLINICAL DATA:  Intraspinal stimulator placement  EXAM: OPERATIVE THORACIC SPINE 3 VIEW(S)  COMPARISON:  CT chest 01/11/2017  FLUOROSCOPY TIME:  0 minutes 14.9 seconds  Does 6.62 mGy  FINDINGS: Osseous demineralization.  Images demonstrate placement of an intraspinal stimulator at the caudal aspect of the thoracic spine.  Lead appears to be present at the T8 and T10 levels.  IMPRESSION: Intraspinal stimulator placement.  Electronically Signed   By: Lavonia Dana M.D.   On: 08/21/2017 10:13 DG C-Arm 1-60 Min CLINICAL DATA:  Intraspinal stimulator placement  EXAM: OPERATIVE THORACIC SPINE 3 VIEW(S)  COMPARISON:  CT chest 01/11/2017  FLUOROSCOPY TIME:  0 minutes 14.9 seconds  Does 6.62 mGy  FINDINGS: Osseous demineralization.  Images demonstrate placement of an intraspinal stimulator at the caudal aspect of the thoracic spine.  Lead appears to be present at the T8 and T10 levels.  IMPRESSION: Intraspinal stimulator placement.  Electronically Signed   By: Lavonia Dana M.D.   On: 08/21/2017 10:13  Complexity Note: Imaging results reviewed. Results shared with Mr. Kimura, using Layman's terms.                         Meds   Current Outpatient Medications:  .  acetaminophen (TYLENOL) 500 MG tablet, Take 1,000 mg by mouth every 6 (six) hours as needed (for pain.). , Disp: , Rfl:  .  aspirin 81 MG tablet, Take 81 mg by mouth daily., Disp: , Rfl:  .  buPROPion (WELLBUTRIN SR) 150 MG 12 hr  tablet, TAKE ONE  TABLET BY MOUTH TWICE A DAY, Disp: 180 tablet, Rfl: 0 .  dicyclomine (BENTYL) 10 MG capsule, Take 10 mg by mouth 2 (two) times daily., Disp: , Rfl:  .  diphenoxylate-atropine (LOMOTIL) 2.5-0.025 MG tablet, TAKE ONE TABLET BY MOUTH FOUR TIMES A DAY, Disp: , Rfl:  .  divalproex (DEPAKOTE ER) 500 MG 24 hr tablet, Take 500 mg by mouth daily., Disp: , Rfl:  .  donepezil (ARICEPT) 10 MG tablet, Take 10 mg by mouth at bedtime., Disp: , Rfl:  .  fenofibrate (TRICOR) 145 MG tablet, TAKE ONE TABLET BY MOUTH DAILY, Disp: 90 tablet, Rfl: 1 .  ferrous sulfate 325 (65 FE) MG tablet, Take 325 mg by mouth daily with breakfast., Disp: , Rfl:  .  finasteride (PROSCAR) 5 MG tablet, Take 1 tablet (5 mg total) by mouth daily., Disp: 30 tablet, Rfl: 11 .  gabapentin (NEURONTIN) 600 MG tablet, TAKE TWO TABLETS BY MOUTH THREE TIMES A DAY. NEED APPT FOR FURTHER REFILLS (Patient taking differently: Take 1,200 mg by mouth 3 (three) times daily. ), Disp: 180 tablet, Rfl: 0 .  lisinopril-hydrochlorothiazide (PRINZIDE,ZESTORETIC) 20-25 MG tablet, TAKE ONE TABLET BY MOUTH TWICE A DAY, Disp: 180 tablet, Rfl: 1 .  memantine (NAMENDA) 10 MG tablet, Take 10 mg by mouth 2 (two) times daily. , Disp: , Rfl:  .  metFORMIN (GLUCOPHAGE) 500 MG tablet, TAKE TWO TABLETS BY MOUTH TWICE A DAY WITH A MEAL, Disp: 360 tablet, Rfl: 1 .  metoprolol succinate (TOPROL-XL) 25 MG 24 hr tablet, Take 25 mg by mouth at bedtime. , Disp: , Rfl:  .  naproxen (NAPROSYN) 500 MG tablet, Take 500 mg by mouth 2 (two) times daily., Disp: , Rfl:  .  omeprazole (PRILOSEC) 20 MG capsule, Take 20 mg by mouth 2 (two) times daily. , Disp: , Rfl:  .  oxyCODONE-acetaminophen (PERCOCET) 7.5-325 MG tablet, Take 1 tablet by mouth 3 (three) times daily., Disp: 100 tablet, Rfl: 0 .  primidone (MYSOLINE) 50 MG tablet, Take 250 mg by mouth 2 (two) times daily. Take 5 tablets (250 mg) by mouth in the morning & take 2 tablets (100 mg) by mouth at night., Disp: , Rfl:  .   QUEtiapine (SEROQUEL) 25 MG tablet, Take 100-125 mg by mouth at bedtime., Disp: , Rfl:  .  simvastatin (ZOCOR) 40 MG tablet, TAKE 1 TABLET (40 MG TOTAL) BY MOUTH DAILY. (Patient taking differently: Take 40 mg by mouth every evening. ), Disp: 90 tablet, Rfl: 0 .  sitaGLIPtin (JANUVIA) 100 MG tablet, Take 1 tablet (100 mg total) by mouth daily., Disp: 30 tablet, Rfl: 2 .  tamsulosin (FLOMAX) 0.4 MG CAPS capsule, Take 1 capsule (0.4 mg total) by mouth daily after supper., Disp: 90 capsule, Rfl: 3 .  vitamin C (ASCORBIC ACID) 500 MG tablet, Take 500 mg by mouth daily., Disp: , Rfl:  .  cyclobenzaprine (FLEXERIL) 10 MG tablet, Take 1 tablet (10 mg total) by mouth 2 (two) times daily as needed for muscle spasms., Disp: 60 tablet, Rfl: 2  ROS  Constitutional: Denies any fever or chills Gastrointestinal: No reported hemesis, hematochezia, vomiting, or acute GI distress Musculoskeletal: Denies any acute onset joint swelling, redness, loss of ROM, or weakness Neurological: No reported episodes of acute onset apraxia, aphasia, dysarthria, agnosia, amnesia, paralysis, loss of coordination, or loss of consciousness  Allergies  Mr. Brobeck is allergic to no known allergies.  [place]  Drug: Mr. Kross  reports that he does not use  drugs. Alcohol:  reports that he drank alcohol. Tobacco:  reports that he has been smoking cigarettes.  He has a 30.00 pack-year smoking history. He has never used smokeless tobacco. 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), Colon polyps, Dementia, Depression, Diabetes mellitus type 2 in nonobese ([place]), GERD (gastroesophageal reflux disease), Headache, 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[place]), History of hiatal hernia, Hypercholesteremia, Hypertension, Hyperthyroidism, Neuropathic pain, Primary localized osteoarthritis of right knee, Stomach ulcer, and  Tremor, essential. Surgical: Mr. Pate  has a past surgical history that includes esophageal stretch; Tonsillectomy; Total knee arthroplasty (Right, 11/15/2014); Lumbar laminectomy/decompression microdiscectomy (Left, 02/03/2016); Colonoscopy with propofol (N/A, 09/14/2016); Joint replacement (Right, 2016); and Pulse generator implant (N/A, 08/21/2017). Family: family history includes Depression in his father; Heart attack in his mother; Heart disease in his mother; Hypertension in his father.  Constitutional Exam  General appearance: Well nourished, well developed, and well hydrated. In no apparent acute distress Vitals:   08/26/17 1337  BP: 111/64  Pulse: 96  Resp: 16  Temp: 97.8 F (36.6 C)  TempSrc: Oral  SpO2: 94%  Weight: 170 lb (77.1 kg)  Height: 5' 11"  (1.803 m)   BMI Assessment: Estimated body mass index is 23.71 kg/m as calculated from the following:   Height as of this encounter: 5' 11"  (1.803 m).   Weight as of this encounter: 170 lb (77.1 kg).  BMI interpretation table: BMI level Category Range association with higher incidence of chronic pain  <18 kg/m2 Underweight   18.5-24.9 kg/m2 Ideal body weight   25-29.9 kg/m2 Overweight Increased incidence by 20%  30-34.9 kg/m2 Obese (Class I) Increased incidence by 68%  35-39.9 kg/m2 Severe obesity (Class II) Increased incidence by 136%  >40 kg/m2 Extreme obesity (Class III) Increased incidence by 254%   BMI Readings from Last 4 Encounters:  08/26/17 23.71 kg/m  08/15/17 24.55 kg/m  07/29/17 24.41 kg/m  07/22/17 24.97 kg/m   Wt Readings from Last 4 Encounters:  08/26/17 170 lb (77.1 kg)  08/15/17 176 lb (79.8 kg)  07/29/17 175 lb (79.4 kg)  07/22/17 174 lb (78.9 kg)  Psych/Mental status: Alert, oriented x 3 (person, place, & time)       Eyes: PERLA Respiratory: No evidence of acute respiratory distress  Cervical Spine Area Exam  Skin & Axial Inspection: No masses, redness, edema, swelling, or associated skin  lesions Alignment: Symmetrical Functional ROM: Unrestricted ROM      Stability: No instability detected Muscle Tone/Strength: Functionally intact. No obvious neuro-muscular anomalies detected. Sensory (Neurological): Unimpaired Palpation: No palpable anomalies              Upper Extremity (UE) Exam    Side: Right upper extremity  Side: Left upper extremity  Skin & Extremity Inspection: Skin color, temperature, and hair growth are WNL. No peripheral edema or cyanosis. No masses, redness, swelling, asymmetry, or associated skin lesions. No contractures.  Skin & Extremity Inspection: Skin color, temperature, and hair growth are WNL. No peripheral edema or cyanosis. No masses, redness, swelling, asymmetry, or associated skin lesions. No contractures.  Functional ROM: Unrestricted ROM          Functional ROM: Unrestricted ROM          Muscle Tone/Strength: Functionally intact. No obvious neuro-muscular anomalies detected.  Muscle Tone/Strength: Functionally intact. No obvious neuro-muscular anomalies detected.  Sensory (Neurological): Unimpaired          Sensory (  Neurological): Unimpaired          Palpation: No palpable anomalies              Palpation: No palpable anomalies              Specialized Test(s): Deferred         Specialized Test(s): Deferred          Thoracic Spine Area Exam  Skin & Axial Inspection: No masses, redness, or swelling Alignment: Symmetrical Functional ROM: Unrestricted ROM Stability: No instability detected Muscle Tone/Strength: Functionally intact. No obvious neuro-muscular anomalies detected. Sensory (Neurological): Unimpaired Muscle strength & Tone: No palpable anomalies  Lumbar Spine Area Exam  Skin & Axial Inspection: Well healed scar from previous spine surgery detected, incisions from paddle implant clean dry and intact. Alignment: Symmetrical Functional ROM: Improved after treatment      Stability: No instability detected Muscle Tone/Strength: Functionally  intact. No obvious neuro-muscular anomalies detected. Sensory (Neurological): Improved Palpation: No palpable anomalies       Provocative Tests: Lumbar Hyperextension and rotation test: evaluation deferred today       Lumbar Lateral bending test: evaluation deferred today       Patrick's Maneuver: evaluation deferred today                    Gait & Posture Assessment  Ambulation: Unassisted Gait: Relatively normal for age and body habitus Posture: WNL   Lower Extremity Exam    Side: Right lower extremity  Side: Left lower extremity  Skin & Extremity Inspection: Skin color, temperature, and hair growth are WNL. No peripheral edema or cyanosis. No masses, redness, swelling, asymmetry, or associated skin lesions. No contractures.  Skin & Extremity Inspection: Skin color, temperature, and hair growth are WNL. No peripheral edema or cyanosis. No masses, redness, swelling, asymmetry, or associated skin lesions. No contractures.  Functional ROM: Unrestricted ROM          Functional ROM: Unrestricted ROM          Muscle Tone/Strength: Functionally intact. No obvious neuro-muscular anomalies detected.  Muscle Tone/Strength: Functionally intact. No obvious neuro-muscular anomalies detected.  Sensory (Neurological): Unimpaired  Sensory (Neurological): Unimpaired  Palpation: No palpable anomalies  Palpation: No palpable anomalies   Assessment  Primary Diagnosis & Pertinent Problem List: The primary encounter diagnosis was Failed back surgical syndrome. Diagnoses of Chronic left-sided low back pain with left-sided sciatica, Lumbosacral radiculopathy, Primary osteoarthritis of left ankle, Chronic pain syndrome, and S/P insertion of spinal cord stimulator were also pertinent to this visit.  Status Diagnosis  Responding Responding Responding 1. Failed back surgical syndrome   2. Chronic left-sided low back pain with left-sided sciatica   3. Lumbosacral radiculopathy   4. Primary osteoarthritis of left  ankle   5. Chronic pain syndrome   6. S/P insertion of spinal cord stimulator      Problems updated and reviewed during this visit: Problem  S/P Insertion of Spinal Cord Stimulator  Chronic Pain Syndrome  Failed Back Surgical Syndrome   Patient presents today for follow-up status post NEVRO spinal cord stimulator paddle implant on 08/21/2017.  Patient is still recovering from his surgery.  Spinal cord stimulator will be turned on 7 days after his implant which will be this Wednesday.  Patient presents today for medication refill.  He states that his back is sore from the procedure.  Patient is also endorsing persistent left ankle pain secondary to peroneus brevis tendon tear.  I recommended the patient follow-up  with orthopedics after he has completed his post surgical checkup with Dr. Cari Caraway.  In regards to medication management, will refill his Percocet as previously prescribed for 2 months.  No change in dose.  Patient not finding benefit with his current muscle relaxant that was prescribed after surgery.  Will have patient discontinue Robaxin and start Flexeril 10 mg twice daily as needed muscle spasms.  Plan: -Prescription for Percocet for 2 months as below -Stop Robaxin.  Prescription for Flexeril 10 mg twice daily as needed muscle spasms -Spinal cord stimulator representative will turn on spinal cord stimulator this upcoming Wednesday -Follow-up in 2 months for medication management.  Plan of Care  Pharmacotherapy (Medications Ordered): Meds ordered this encounter  Medications  . DISCONTD: oxyCODONE-acetaminophen (PERCOCET) 7.5-325 MG tablet    Sig: Take 1 tablet by mouth 3 (three) times daily.    Dispense:  100 tablet    Refill:  0    For chronic pain To fill on or after: 09/01/17, 09/30/17 To last for 30 days from fill date  . cyclobenzaprine (FLEXERIL) 10 MG tablet    Sig: Take 1 tablet (10 mg total) by mouth 2 (two) times daily as needed for muscle spasms.    Dispense:   60 tablet    Refill:  2    Do not place this medication, or any other prescription from our practice, on "Automatic Refill". Patient may have prescription filled one day early if pharmacy is closed on scheduled refill date.  Marland Kitchen oxyCODONE-acetaminophen (PERCOCET) 7.5-325 MG tablet    Sig: Take 1 tablet by mouth 3 (three) times daily.    Dispense:  100 tablet    Refill:  0    For chronic pain To fill on or after: 09/01/17, 09/30/17 To last for 30 days from fill date    Provider-requested follow-up: Return in about 8 weeks (around 10/21/2017) for Medication Management. Time Note: Greater than 50% of the 25 minute(s) of face-to-face time spent with Mr. Holt, was spent in counseling/coordination of care regarding: Mr. Wynns's primary cause of pain, the treatment plan, the opioid analgesic risks and possible complications, the results, interpretation and significance of  his recent diagnostic interventional treatment(s), realistic expectations, the goals of pain management (increased in functionality) and the patient's responsibilities when it comes to controlled substances. Future Appointments  Date Time Provider [place]  10/21/2017  1:30 PM [name], MD [facility] None  11/15/2017  4:00 PM O'Brien-Blaney, Bryson Corona, LPN [id number]  [id number]  9:30 AM [facility]    Primary Care Physician: Leone Haven, MD Location: [facility] Note by: [name], M.D Date: 08/26/2017; Time: 2:19 PM  Patient Instructions  1. Glad that you're better after your implant, Truman Hayward will turn on stimulation Wed 2. Rx for 2 months for Percocet at current dose 3. Stop Robaxin, start Flexeril 10 mg BID prn muscle spasms

## 2017-08-26 NOTE — Patient Instructions (Signed)
1. Glad that you're better after your implant, James Hood will turn on stimulation Wed 2. Rx for 2 months for Percocet at current dose 3. Stop Robaxin, start Flexeril 10 mg BID prn muscle spasms

## 2017-08-26 NOTE — Progress Notes (Signed)
Nursing Pain Medication Assessment:  Safety precautions to be maintained throughout the outpatient stay will include: orient to surroundings, keep bed in low position, maintain call bell within reach at all times, provide assistance with transfer out of bed and ambulation.  Medication Inspection Compliance: Mr. Monteverde did not comply with our request to bring his pills to be counted. He was reminded that bringing the medication bottles, even when empty, is a requirement.  Medication: None brought in. Pill/Patch Count: None available to be counted. Bottle Appearance: No container available. Did not bring bottle(s) to appointment. Filled Date: N/A Last Medication intake:  Yesterday

## 2017-08-28 ENCOUNTER — Other Ambulatory Visit: Payer: Self-pay | Admitting: Family Medicine

## 2017-08-28 NOTE — Telephone Encounter (Signed)
Last OV 02/27/17 last filled 08/01/17 180 [id number]

## 2017-08-28 NOTE — Telephone Encounter (Signed)
Sent to pharmacy. Needs an appointment for future refills.

## 2017-08-29 ENCOUNTER — Other Ambulatory Visit: Payer: Self-pay | Admitting: Family Medicine

## 2017-08-29 NOTE — Telephone Encounter (Signed)
Patient is scheduled   

## 2017-09-02 ENCOUNTER — Ambulatory Visit: Payer: PPO | Admitting: Family Medicine

## 2017-09-02 DIAGNOSIS — Z2089 Contact with and (suspected) exposure to other communicable diseases: Secondary | ICD-10-CM

## 2017-09-17 ENCOUNTER — Other Ambulatory Visit: Payer: Self-pay

## 2017-09-17 ENCOUNTER — Encounter: Payer: Self-pay | Admitting: Family Medicine

## 2017-09-17 ENCOUNTER — Ambulatory Visit (INDEPENDENT_AMBULATORY_CARE_PROVIDER_SITE_OTHER): Payer: PPO | Admitting: Family Medicine

## 2017-09-17 VITALS — BP 100/60 | HR 85 | Temp 97.7°F | Ht 71.0 in | Wt 178.4 lb

## 2017-09-17 DIAGNOSIS — Z9889 Other specified postprocedural states: Secondary | ICD-10-CM

## 2017-09-17 DIAGNOSIS — E1142 Type 2 diabetes mellitus with diabetic polyneuropathy: Secondary | ICD-10-CM | POA: Diagnosis not present

## 2017-09-17 DIAGNOSIS — I10 Essential (primary) hypertension: Secondary | ICD-10-CM | POA: Diagnosis not present

## 2017-09-17 DIAGNOSIS — F329 Major depressive disorder, single episode, unspecified: Secondary | ICD-10-CM

## 2017-09-17 DIAGNOSIS — R61 Generalized hyperhidrosis: Secondary | ICD-10-CM | POA: Diagnosis not present

## 2017-09-17 DIAGNOSIS — F32A Depression, unspecified: Secondary | ICD-10-CM

## 2017-09-17 DIAGNOSIS — Z9689 Presence of other specified functional implants: Secondary | ICD-10-CM

## 2017-09-17 DIAGNOSIS — E785 Hyperlipidemia, unspecified: Secondary | ICD-10-CM | POA: Diagnosis not present

## 2017-09-17 DIAGNOSIS — F419 Anxiety disorder, unspecified: Secondary | ICD-10-CM

## 2017-09-17 DIAGNOSIS — N400 Enlarged prostate without lower urinary tract symptoms: Secondary | ICD-10-CM | POA: Diagnosis not present

## 2017-09-17 NOTE — Assessment & Plan Note (Signed)
Symptomatically stable.  He will continue his current regimen.

## 2017-09-17 NOTE — Assessment & Plan Note (Signed)
Continue current medication.  Check A1c. 

## 2017-09-17 NOTE — Assessment & Plan Note (Signed)
Continues to have very minimal sweating at night.  Prior extensive work-up unremarkable.  Weight has been increasing.  Discussed checking his sugar when this occurs at night.  Could be medication related.  He will continue to monitor at this time.

## 2017-09-17 NOTE — Assessment & Plan Note (Signed)
Patient has had a complicated postop course with infection in the area of his wound.  Based on his description it appears to have improved quite a bit on antibiotics.  He will monitor this and complete his course of antibiotics.  He will see the surgeon next week as planned.  He is given return precautions.

## 2017-09-17 NOTE — Assessment & Plan Note (Signed)
Seems relatively stable.  No SI.  We will refer to our therapist.

## 2017-09-17 NOTE — Assessment & Plan Note (Signed)
He will return for fasting labs.

## 2017-09-17 NOTE — Progress Notes (Signed)
Tommi Rumps, MD Phone: [phone]  James Hood is a 77 y.o. male who presents today for f/u.  DIABETES Disease Monitoring: Blood Sugar ranges-not checking Polyuria/phagia/dipsia- no      Optho- needs to schedule Medications: Compliance- taking januvia and metformin Hypoglycemic symptoms- rare if he does not eat a meal tid  HYPERTENSION  Disease Monitoring  Home BP Monitoring not checking Chest pain- no    Dyspnea- no Medications  Compliance-  Taking lisinopril, hctz, metoprolol.  Edema- no  BPH: Notes his flow is good.  Asymptomatic.  On Flomax and Proscar.  Has not seen urology in some time.  Last PSA was acceptable.  Depression/anxiety: He continues to have some symptoms mostly of depression and also of some guilt.  He does not see psychiatry.  He notes no SI.  He continues on his current regimen.  He would like to see a therapist.  He had a spinal cord stimulator placed.  He notes he got infected and he has followed up with the surgeon regarding that.  He is currently on antibiotics for the past 2 weeks and for another week as well.  He sees them again next week.  He notes the whole thing was erythematous previously.  He notes minimal night sweats that have been chronic and persistent.  Slightly damp at times.  He had quite an evaluation previously with no cause found.  He has not tried checking his sugar when this occurs.  They sleep with the temperature at 67 F at night.   Social History   Tobacco Use  Smoking Status Current Every Day Smoker  . Packs/day: 0.50  . Years: 60.00  . Pack years: 30.00  . Types: Cigarettes  . Last attempt to quit: 08/2016  . Years since quitting: 1.0  Smokeless Tobacco Never Used     ROS see history of present illness  Objective  Physical Exam Vitals:   09/17/17 1125  BP: 100/60  Pulse: 85  Temp: 97.7 F (36.5 C)  SpO2: 94%    BP Readings from Last 3 Encounters:  09/17/17 100/60  08/26/17 111/64  08/21/17 124/66     Wt Readings from Last 3 Encounters:  09/17/17 178 lb 6.4 oz (80.9 kg)  08/26/17 170 lb (77.1 kg)  08/15/17 176 lb (79.8 kg)    Physical Exam  Constitutional: No distress.  Cardiovascular: Normal rate, regular rhythm and normal heart sounds.  Pulmonary/Chest: Effort normal and breath sounds normal.  Musculoskeletal: He exhibits no edema.       Arms: Neurological: He is alert.  Skin: Skin is warm and dry. He is not diaphoretic.   Diabetic Foot Exam - Simple   Simple Foot Form Diabetic Foot exam was performed with the following findings:  Yes 09/17/2017 12:09 PM  Visual Inspection No deformities, no ulcerations, no other skin breakdown bilaterally:  Yes Sensation Testing Pulse Check Posterior Tibialis and Dorsalis pulse intact bilaterally:  Yes Comments      Assessment/Plan: Please see individual problem list.  Hypertension Well-controlled.  Continue current regimen.  He will return for lab work.  Type 2 diabetes mellitus ([place]) Continue current medication.  Check A1c.  HLD (hyperlipidemia) He will return for fasting labs.  Anxiety and depression Seems relatively stable.  No SI.  We will refer to our therapist.  S/P insertion of spinal cord stimulator Patient has had a complicated postop course with infection in the area of his wound.  Based on his description it appears to have improved quite a  bit on antibiotics.  He will monitor this and complete his course of antibiotics.  He will see the surgeon next week as planned.  He is given return precautions.  Night sweats Continues to have very minimal sweating at night.  Prior extensive work-up unremarkable.  Weight has been increasing.  Discussed checking his sugar when this occurs at night.  Could be medication related.  He will continue to monitor at this time.  Benign prostatic hyperplasia Symptomatically stable.  He will continue his current regimen.   Health Maintenance:   Orders Placed This Encounter   Procedures  . Comp Met (CMET)    Standing Status:   Future    Standing Expiration Date:   09/18/2018  . Lipid panel    Standing Status:   Future    Standing Expiration Date:   09/18/2018  . Hemoglobin A1c    Standing Status:   Future    Standing Expiration Date:   09/18/2018  . Ambulatory referral to Psychology    Referral Priority:   Routine    Referral Type:   Psychiatric    Referral Reason:   Specialty Services Required    Requested Specialty:   Psychology    Number of Visits Requested:   1    No orders of the defined types were placed in this encounter.    Tommi Rumps, MD [place]

## 2017-09-17 NOTE — Assessment & Plan Note (Signed)
Well-controlled.  Continue current regimen.  He will return for lab work.

## 2017-09-17 NOTE — Patient Instructions (Signed)
Nice to see you. Please complete your antibiotics and follow-up with the surgeon as planned. We will get you referred to see a therapist. We will have you return for fasting lab work.

## 2017-09-24 ENCOUNTER — Other Ambulatory Visit: Payer: Self-pay | Admitting: Family Medicine

## 2017-09-25 ENCOUNTER — Other Ambulatory Visit: Payer: PPO

## 2017-09-25 ENCOUNTER — Other Ambulatory Visit (INDEPENDENT_AMBULATORY_CARE_PROVIDER_SITE_OTHER): Payer: PPO

## 2017-09-25 DIAGNOSIS — E1142 Type 2 diabetes mellitus with diabetic polyneuropathy: Secondary | ICD-10-CM | POA: Diagnosis not present

## 2017-09-25 DIAGNOSIS — E785 Hyperlipidemia, unspecified: Secondary | ICD-10-CM | POA: Diagnosis not present

## 2017-09-25 DIAGNOSIS — I10 Essential (primary) hypertension: Secondary | ICD-10-CM

## 2017-09-25 LAB — HEMOGLOBIN A1C: Hgb A1c MFr Bld: 6.8 % — ABNORMAL HIGH (ref 4.6–6.5)

## 2017-09-25 LAB — LIPID PANEL
Cholesterol: 134 mg/dL (ref 0–200)
HDL: 50.7 mg/dL (ref >39.00)
LDL Cholesterol: 57 mg/dL (ref 0–99)
NonHDL: 82.86
Total CHOL/HDL Ratio: 3
Triglycerides: 127 mg/dL (ref 0.0–149.0)
VLDL: 25.4 mg/dL (ref 0.0–40.0)

## 2017-09-25 LAB — COMPREHENSIVE METABOLIC PANEL
ALT: 13 U/L (ref 0–53)
AST: 20 U/L (ref 0–37)
Albumin: 3.9 g/dL (ref 3.5–5.2)
Alkaline Phosphatase: 32 U/L — ABNORMAL LOW (ref 39–117)
BUN: 27 mg/dL — ABNORMAL HIGH (ref 6–23)
CO2: 31 mEq/L (ref 19–32)
Calcium: 9.2 mg/dL (ref 8.4–10.5)
Chloride: 101 mEq/L (ref 96–112)
Creatinine, Ser: 1.2 mg/dL (ref 0.40–1.50)
GFR: 62.29 mL/min (ref >60.00)
Glucose, Bld: 135 mg/dL — ABNORMAL HIGH (ref 70–99)
Potassium: 4.5 mEq/L (ref 3.5–5.1)
Sodium: 139 mEq/L (ref 135–145)
Total Bilirubin: 0.2 mg/dL (ref 0.2–1.2)
Total Protein: 6.3 g/dL (ref 6.0–8.3)

## 2017-09-28 ENCOUNTER — Other Ambulatory Visit: Payer: Self-pay | Admitting: Family Medicine

## 2017-09-30 DIAGNOSIS — M25572 Pain in left ankle and joints of left foot: Secondary | ICD-10-CM | POA: Diagnosis not present

## 2017-10-01 ENCOUNTER — Other Ambulatory Visit: Payer: Self-pay

## 2017-10-01 MED ORDER — GABAPENTIN 600 MG PO TABS: ORAL_TABLET | ORAL | 3 refills | Status: DC

## 2017-10-01 NOTE — Telephone Encounter (Signed)
Sent to his pharmacy.  I apologize for any delayed though it appears they sent one refill request prior to the one I just received and this was sent on a Saturday.

## 2017-10-01 NOTE — Telephone Encounter (Signed)
Patient states his pharmacy has sent over 3 refill request for gabapentin and would like this refilled.

## 2017-10-01 NOTE — Telephone Encounter (Signed)
Last OV 09/17/17 last filled 08/28/17 180 0rf

## 2017-10-02 ENCOUNTER — Ambulatory Visit: Payer: PPO | Admitting: Psychology

## 2017-10-02 DIAGNOSIS — F320 Major depressive disorder, single episode, mild: Secondary | ICD-10-CM

## 2017-10-15 ENCOUNTER — Encounter: Payer: Self-pay | Admitting: Student in an Organized Health Care Education/Training Program

## 2017-10-15 ENCOUNTER — Ambulatory Visit
Payer: PPO | Attending: Student in an Organized Health Care Education/Training Program | Admitting: Student in an Organized Health Care Education/Training Program

## 2017-10-15 VITALS — BP 108/32 | HR 83 | Temp 98.0°F | Resp 16 | Ht 71.0 in | Wt 175.0 lb

## 2017-10-15 DIAGNOSIS — E7800 Pure hypercholesterolemia, unspecified: Secondary | ICD-10-CM | POA: Insufficient documentation

## 2017-10-15 DIAGNOSIS — Z7952 Long term (current) use of systemic steroids: Secondary | ICD-10-CM | POA: Diagnosis not present

## 2017-10-15 DIAGNOSIS — M25551 Pain in right hip: Secondary | ICD-10-CM | POA: Diagnosis not present

## 2017-10-15 DIAGNOSIS — N400 Enlarged prostate without lower urinary tract symptoms: Secondary | ICD-10-CM | POA: Insufficient documentation

## 2017-10-15 DIAGNOSIS — M5442 Lumbago with sciatica, left side: Secondary | ICD-10-CM

## 2017-10-15 DIAGNOSIS — M5417 Radiculopathy, lumbosacral region: Secondary | ICD-10-CM

## 2017-10-15 DIAGNOSIS — I10 Essential (primary) hypertension: Secondary | ICD-10-CM | POA: Insufficient documentation

## 2017-10-15 DIAGNOSIS — M5386 Other specified dorsopathies, lumbar region: Secondary | ICD-10-CM

## 2017-10-15 DIAGNOSIS — K529 Noninfective gastroenteritis and colitis, unspecified: Secondary | ICD-10-CM | POA: Diagnosis not present

## 2017-10-15 DIAGNOSIS — F419 Anxiety disorder, unspecified: Secondary | ICD-10-CM | POA: Diagnosis not present

## 2017-10-15 DIAGNOSIS — Z8719 Personal history of other diseases of the digestive system: Secondary | ICD-10-CM | POA: Diagnosis not present

## 2017-10-15 DIAGNOSIS — M19072 Primary osteoarthritis, left ankle and foot: Secondary | ICD-10-CM

## 2017-10-15 DIAGNOSIS — K2270 Barrett's esophagus without dysplasia: Secondary | ICD-10-CM | POA: Insufficient documentation

## 2017-10-15 DIAGNOSIS — Z818 Family history of other mental and behavioral disorders: Secondary | ICD-10-CM | POA: Insufficient documentation

## 2017-10-15 DIAGNOSIS — Z79891 Long term (current) use of opiate analgesic: Secondary | ICD-10-CM | POA: Insufficient documentation

## 2017-10-15 DIAGNOSIS — Z7982 Long term (current) use of aspirin: Secondary | ICD-10-CM | POA: Insufficient documentation

## 2017-10-15 DIAGNOSIS — M961 Postlaminectomy syndrome, not elsewhere classified: Secondary | ICD-10-CM

## 2017-10-15 DIAGNOSIS — F0390 Unspecified dementia without behavioral disturbance: Secondary | ICD-10-CM | POA: Diagnosis not present

## 2017-10-15 DIAGNOSIS — K219 Gastro-esophageal reflux disease without esophagitis: Secondary | ICD-10-CM | POA: Insufficient documentation

## 2017-10-15 DIAGNOSIS — E0590 Thyrotoxicosis, unspecified without thyrotoxic crisis or storm: Secondary | ICD-10-CM | POA: Diagnosis not present

## 2017-10-15 DIAGNOSIS — G894 Chronic pain syndrome: Secondary | ICD-10-CM | POA: Diagnosis not present

## 2017-10-15 DIAGNOSIS — E119 Type 2 diabetes mellitus without complications: Secondary | ICD-10-CM | POA: Insufficient documentation

## 2017-10-15 DIAGNOSIS — Z8249 Family history of ischemic heart disease and other diseases of the circulatory system: Secondary | ICD-10-CM | POA: Insufficient documentation

## 2017-10-15 DIAGNOSIS — Z96651 Presence of right artificial knee joint: Secondary | ICD-10-CM | POA: Insufficient documentation

## 2017-10-15 DIAGNOSIS — R51 Headache: Secondary | ICD-10-CM | POA: Diagnosis not present

## 2017-10-15 DIAGNOSIS — Z86010 Personal history of colonic polyps: Secondary | ICD-10-CM | POA: Insufficient documentation

## 2017-10-15 DIAGNOSIS — Z9889 Other specified postprocedural states: Secondary | ICD-10-CM | POA: Diagnosis not present

## 2017-10-15 DIAGNOSIS — F17210 Nicotine dependence, cigarettes, uncomplicated: Secondary | ICD-10-CM | POA: Insufficient documentation

## 2017-10-15 DIAGNOSIS — G8929 Other chronic pain: Secondary | ICD-10-CM

## 2017-10-15 DIAGNOSIS — Z7984 Long term (current) use of oral hypoglycemic drugs: Secondary | ICD-10-CM | POA: Diagnosis not present

## 2017-10-15 DIAGNOSIS — F329 Major depressive disorder, single episode, unspecified: Secondary | ICD-10-CM | POA: Insufficient documentation

## 2017-10-15 DIAGNOSIS — Z79899 Other long term (current) drug therapy: Secondary | ICD-10-CM | POA: Diagnosis not present

## 2017-10-15 DIAGNOSIS — F1190 Opioid use, unspecified, uncomplicated: Secondary | ICD-10-CM | POA: Diagnosis not present

## 2017-10-15 DIAGNOSIS — Z760 Encounter for issue of repeat prescription: Secondary | ICD-10-CM | POA: Insufficient documentation

## 2017-10-15 DIAGNOSIS — K449 Diaphragmatic hernia without obstruction or gangrene: Secondary | ICD-10-CM | POA: Insufficient documentation

## 2017-10-15 DIAGNOSIS — Z9689 Presence of other specified functional implants: Secondary | ICD-10-CM

## 2017-10-15 MED ORDER — OXYCODONE-ACETAMINOPHEN 7.5-325 MG PO TABS: 1.0000 | ORAL_TABLET | Freq: Three times a day (TID) | ORAL | 0 refills | Status: DC

## 2017-10-15 NOTE — Progress Notes (Signed)
Patient's Name: James Hood  MRN: [id number]  Referring Provider: Leone Haven, MD  DOB: May 12, 1941  PCP: Leone Haven, MD  DOS: 10/15/2017  Note by: [name], MD  Service setting: Ambulatory outpatient  Specialty: Interventional Pain Management  Location: [facility] (AMB) Pain Management Facility    Patient type: Established   Primary Reason(s) for Visit: Encounter for prescription drug management. (Level of risk: moderate)  CC: Ankle Pain (left) and Leg Pain (left )  HPI  James Hood is a 77 y.o. year old, male patient, who comes today for a medication management evaluation. He has Hypertension; Tremor, essential; Primary localized osteoarthritis of right knee; Anxiety and depression; DJD (degenerative joint disease) of knee; Trochanteric bursitis of left hip; Headache; Falls; Barrett's esophagus; Chronic lumbar pain; Benign prostatic hyperplasia; Benign essential tremor; Benign neoplasm of colon; Chronic diarrhea; Type 2 diabetes mellitus ([place]); Difficulty in walking; HLD (hyperlipidemia); Amnesia; Testicular hypofunction; Absence of sensation; Episode of syncope; Hypertriglyceridemia; Anemia; Lumbar herniated disc; Night sweats; Right hip pain; Purpura ([place]); Thyroid nodule; Fatty liver; S/P insertion of spinal cord stimulator; Chronic pain syndrome; and Failed back surgical syndrome on their problem list. His primarily concern today is the Ankle Pain (left) and Leg Pain (left )  Pain Assessment: Location: Left Ankle Radiating: na Onset: More than a month ago Duration: Chronic pain Quality: Discomfort, Aching, Constant Severity: 4 /10 (subjective, self-reported pain score)  Note: Reported level is compatible with observation.                         When using our objective Pain Scale, levels between 6 and 10/10 are said to belong in an emergency room, as it progressively worsens from a 6/10, described as severely limiting, requiring emergency care not usually available at an  outpatient pain management facility. At a 6/10 level, communication becomes difficult and requires great effort. Assistance to reach the emergency department may be required. Facial flushing and profuse sweating along with potentially dangerous increases in heart rate and blood pressure will be evident. Effect on ADL: trouble with incisions from SCS placement not healing and there may have to be additional surgeries to move the device.  wearing ankle brace Timing: Constant Modifying factors: SCS is helping left leg and hip.  medications BP: (!) 108/32  HR: 83  James Hood was last scheduled for an appointment on 08/26/2017 for medication management. During today's appointment we reviewed James Hood's chronic pain status, as well as his outpatient medication regimen.  Patient presents today for medication refill.  He states that the spinal cord stimulator is helping out with his left leg pain.  Is continuing to endorse left ankle pain.  Received a cortisone injection in his left ankle which was only somewhat helpful last week.  Scheduled to see Dr. Cari Caraway later this week to evaluate IPG incision.  Patient is somewhat frustrated with the fact that he is continuing to take antibiotics and has a nonhealing wound.  The patient  reports that he does not use drugs. His body mass index is 24.41 kg/m.  Further details on both, my assessment(s), as well as the proposed treatment plan, please see below.  Controlled Substance Pharmacotherapy Assessment REMS (Risk Evaluation and Mitigation Strategy)  Analgesic: Percocet 7.5 mg 3 times daily to 4 times daily as needed, quantity 100/month MME/day: 34 mg/day.  Janett Billow, RN  10/15/2017 12:10 PM  Sign at close encounter Nursing Pain Medication Assessment:  Safety precautions to be  maintained throughout the outpatient stay will include: orient to surroundings, keep bed in low position, maintain call bell within reach at all times, provide  assistance with transfer out of bed and ambulation.  Medication Inspection Compliance: Pill count conducted under aseptic conditions, in front of the patient. Neither the pills nor the bottle was removed from the patient's sight at any time. Once count was completed pills were immediately returned to the patient in their original bottle.  Medication: Oxycodone/APAP Pill/Patch Count: 90 of 100 pills remain Pill/Patch Appearance: Markings consistent with prescribed medication Bottle Appearance: Standard pharmacy container. Clearly labeled. Filled Date: 05 / 22 / 2019 Last Medication intake:  Today Pharmacokinetics: Liberation and absorption (onset of action): WNL Distribution (time to peak effect): WNL Metabolism and excretion (duration of action): WNL         Pharmacodynamics: Desired effects: Analgesia: James Hood reports >50% benefit. Functional ability: Patient reports that medication allows him to accomplish basic ADLs Clinically meaningful improvement in function (CMIF): Sustained CMIF goals met Perceived effectiveness: Described as relatively effective, allowing for increase in activities of daily living (ADL) Undesirable effects: Side-effects or Adverse reactions: None reported Monitoring: [place] PMP: Online review of the past 39-monthperiod conducted. Compliant with practice rules and regulations Last UDS on record: No results found for: SUMMARY UDS interpretation: Compliant          Medication Assessment Form: Reviewed. Patient indicates being compliant with therapy Treatment compliance: Compliant Risk Assessment Profile: Aberrant behavior: See prior evaluations. None observed or detected today Comorbid factors increasing risk of overdose: See prior notes. No additional risks detected today Risk of substance use disorder (SUD): Low Opioid Risk Tool - 08/26/17 1344      Family History of Substance Abuse   Alcohol  Negative    Illegal Drugs  Negative    Rx Drugs  Negative       Personal History of Substance Abuse   Alcohol  Positive Male or Male    Illegal Drugs  Negative    Rx Drugs  Negative      Psychological Disease   Psychological Disease  Positive    ADD  Negative    OCD  Negative    Bipolar  Negative    Schizophrenia  Negative    Depression  Positive      Total Score   Opioid Risk Tool Scoring  6    Opioid Risk Interpretation  Moderate Risk      ORT Scoring interpretation table:  Score <3 = Low Risk for SUD  Score between 4-7 = Moderate Risk for SUD  Score >8 = High Risk for Opioid Abuse   Risk Mitigation Strategies:  Patient Counseling: Covered Patient-Prescriber Agreement (PPA): Present and active  Notification to other healthcare providers: Done  Pharmacologic Plan: No change in therapy, at this time.             Laboratory Chemistry  Inflammation Markers (CRP: Acute Phase) (ESR: Chronic Phase) Lab Results  Component Value Date   ESRSEDRATE 11 01/29/2017                         Rheumatology Markers No results found for: RF, ANA, LABURIC, URICUR, LYMEIGGIGMAB, LYMEABIGMQN, HLAB27                      Renal Function Markers Lab Results  Component Value Date   BUN 27 (H) 09/25/2017   CREATININE 1.20 09/25/2017   GFRAA >60  08/15/2017   GFRNONAA >60 08/15/2017                              Hepatic Function Markers Lab Results  Component Value Date   AST 20 09/25/2017   ALT 13 09/25/2017   ALBUMIN 3.9 09/25/2017   ALKPHOS 32 (L) 09/25/2017   AMMONIA 14 08/09/2016                        Electrolytes Lab Results  Component Value Date   NA 139 09/25/2017   K 4.5 09/25/2017   CL 101 09/25/2017   CALCIUM 9.2 09/25/2017                        Neuropathy Markers Lab Results  Component Value Date   HGBA1C 6.8 (H) 09/25/2017   HIV NONREACTIVE 06/19/2016                        Bone Pathology Markers No results found for: Mull[place], [id number], [id number], [id number], 25OHVITD1, 25OHVITD2, 25OHVITD3, TESTOFREE,  TESTOSTERONE                       Coagulation Parameters Lab Results  Component Value Date   INR 0.92 08/15/2017   LABPROT 12.3 08/15/2017   APTT 34 08/15/2017   PLT 273 08/15/2017                        Cardiovascular Markers Lab Results  Component Value Date   CKTOTAL 468 (H) 08/14/2016   HGB 11.4 (L) 08/15/2017   HCT 34.4 (L) 08/15/2017                         CA Markers No results found for: CEA, CA125, LABCA2                      Note: Lab results reviewed.  Recent Diagnostic Imaging Results  DG Thoracic Spine 1 View CLINICAL DATA:  Intraspinal stimulator placement  EXAM: OPERATIVE THORACIC SPINE 3 VIEW(S)  COMPARISON:  CT chest 01/11/2017  FLUOROSCOPY TIME:  0 minutes 14.9 seconds  Does 6.62 mGy  FINDINGS: Osseous demineralization.  Images demonstrate placement of an intraspinal stimulator at the caudal aspect of the thoracic spine.  Lead appears to be present at the T8 and T10 levels.  IMPRESSION: Intraspinal stimulator placement.  Electronically Signed   By: Lavonia Dana M.D.   On: 08/21/2017 10:13 DG C-Arm 1-60 Min CLINICAL DATA:  Intraspinal stimulator placement  EXAM: OPERATIVE THORACIC SPINE 3 VIEW(S)  COMPARISON:  CT chest 01/11/2017  FLUOROSCOPY TIME:  0 minutes 14.9 seconds  Does 6.62 mGy  FINDINGS: Osseous demineralization.  Images demonstrate placement of an intraspinal stimulator at the caudal aspect of the thoracic spine.  Lead appears to be present at the T8 and T10 levels.  IMPRESSION: Intraspinal stimulator placement.  Electronically Signed   By: Lavonia Dana M.D.   On: 08/21/2017 10:13  Complexity Note: Imaging results reviewed. Results shared with Mr. Fontanez, using Layman's terms.                         Meds   Current Outpatient Medications:  .  acetaminophen (TYLENOL) 500 MG tablet, Take 1,000 mg by mouth every 6 (six)  hours as needed (for pain.). , Disp: , Rfl:  .  aspirin 81 MG tablet, Take 81 mg by  mouth daily., Disp: , Rfl:  .  buPROPion (WELLBUTRIN SR) 150 MG 12 hr tablet, TAKE ONE TABLET BY MOUTH TWICE A DAY, Disp: 180 tablet, Rfl: 0 .  clindamycin (CLEOCIN) 300 MG capsule, , Disp: , Rfl:  .  cyclobenzaprine (FLEXERIL) 10 MG tablet, Take 1 tablet (10 mg total) by mouth 2 (two) times daily as needed for muscle spasms., Disp: 60 tablet, Rfl: 2 .  diphenoxylate-atropine (LOMOTIL) 2.5-0.025 MG tablet, TAKE ONE TABLET BY MOUTH FOUR TIMES A DAY, Disp: , Rfl:  .  divalproex (DEPAKOTE ER) 500 MG 24 hr tablet, Take 500 mg by mouth daily., Disp: , Rfl:  .  divalproex (DEPAKOTE ER) 500 MG 24 hr tablet, Take 500 mg by mouth daily., Disp: , Rfl:  .  donepezil (ARICEPT) 10 MG tablet, Take 10 mg by mouth at bedtime., Disp: , Rfl:  .  fenofibrate (TRICOR) 145 MG tablet, TAKE ONE TABLET BY MOUTH DAILY, Disp: 90 tablet, Rfl: 1 .  ferrous sulfate 325 (65 FE) MG tablet, Take 325 mg by mouth daily with breakfast., Disp: , Rfl:  .  finasteride (PROSCAR) 5 MG tablet, Take 1 tablet (5 mg total) by mouth daily., Disp: 30 tablet, Rfl: 11 .  gabapentin (NEURONTIN) 600 MG tablet, TAKE TWO TABLETS BY MOUTH THREE TIMES A DAY, Disp: 180 tablet, Rfl: 3 .  lisinopril-hydrochlorothiazide (PRINZIDE,ZESTORETIC) 20-25 MG tablet, TAKE ONE TABLET BY MOUTH TWICE A DAY, Disp: 180 tablet, Rfl: 1 .  memantine (NAMENDA) 10 MG tablet, Take 10 mg by mouth 2 (two) times daily. , Disp: , Rfl:  .  metFORMIN (GLUCOPHAGE) 500 MG tablet, TAKE TWO TABLETS BY MOUTH TWICE A DAY WITH A MEAL, Disp: 360 tablet, Rfl: 1 .  metoprolol succinate (TOPROL-XL) 25 MG 24 hr tablet, Take 25 mg by mouth at bedtime. , Disp: , Rfl:  .  naproxen (NAPROSYN) 500 MG tablet, Take 500 mg by mouth 2 (two) times daily., Disp: , Rfl:  .  omeprazole (PRILOSEC) 20 MG capsule, Take 20 mg by mouth 2 (two) times daily. , Disp: , Rfl:  .  oxyCODONE-acetaminophen (PERCOCET) 7.5-325 MG tablet, Take 1 tablet by mouth 3 (three) times daily., Disp: 100 tablet, Rfl: 0 .   primidone (MYSOLINE) 50 MG tablet, Take 250 mg by mouth 2 (two) times daily. Take 5 tablets (250 mg) by mouth in the morning & take 2 tablets (100 mg) by mouth at night., Disp: , Rfl:  .  QUEtiapine (SEROQUEL) 25 MG tablet, Take 100-125 mg by mouth at bedtime., Disp: , Rfl:  .  simvastatin (ZOCOR) 40 MG tablet, TAKE 1 TABLET (40 MG TOTAL) BY MOUTH DAILY., Disp: 90 tablet, Rfl: 0 .  sitaGLIPtin (JANUVIA) 100 MG tablet, Take 1 tablet (100 mg total) by mouth daily., Disp: 30 tablet, Rfl: 2 .  sulfamethoxazole-trimethoprim (BACTRIM DS,SEPTRA DS) 800-160 MG tablet, Take 2 tablets by mouth 2 (two) times daily., Disp: , Rfl:  .  tamsulosin (FLOMAX) 0.4 MG CAPS capsule, Take 1 capsule (0.4 mg total) by mouth daily after supper., Disp: 90 capsule, Rfl: 3 .  vitamin C (ASCORBIC ACID) 500 MG tablet, Take 500 mg by mouth daily., Disp: , Rfl:   ROS  Constitutional: Denies any fever or chills Gastrointestinal: No reported hemesis, hematochezia, vomiting, or acute GI distress Musculoskeletal: Denies any acute onset joint swelling, redness, loss of ROM, or weakness Neurological: No reported episodes  of acute onset apraxia, aphasia, dysarthria, agnosia, amnesia, paralysis, loss of coordination, or loss of consciousness  Allergies  Mr. Salek is allergic to no known allergies.  [place]  Drug: Mr. Ratay  reports that he does not use drugs. Alcohol:  reports that he drank alcohol. Tobacco:  reports that he has been smoking cigarettes.  He has a 30.00 pack-year smoking history. He has never used smokeless tobacco. 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), Colon polyps, Dementia, Depression, Diabetes mellitus type 2 in nonobese ([place]), GERD (gastroesophageal reflux disease), Headache, 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[place]), History of hiatal hernia, Hypercholesteremia, Hypertension,  Hyperthyroidism, Neuropathic pain, Primary localized osteoarthritis of right knee, Stomach ulcer, and Tremor, essential. Surgical: Mr. Bontempo  has a past surgical history that includes esophageal stretch; Tonsillectomy; Total knee arthroplasty (Right, 11/15/2014); Lumbar laminectomy/decompression microdiscectomy (Left, 02/03/2016); Colonoscopy with propofol (N/A, 09/14/2016); Joint replacement (Right, 2016); and Pulse generator implant (N/A, 08/21/2017). Family: family history includes Depression in his father; Heart attack in his mother; Heart disease in his mother; Hypertension in his father.  Constitutional Exam  General appearance: Well nourished, well developed, and well hydrated. In no apparent acute distress Vitals:   10/15/17 1200  BP: (!) 108/32  Pulse: 83  Resp: 16  Temp: 98 F (36.7 C)  TempSrc: Oral  SpO2: 95%  Weight: 175 lb (79.4 kg)  Height: _0  (1.803 m)   BMI Assessment: Estimated body mass index is 24.41 kg/m as calculated from the following:   Height as of this encounter: _1  (1.803 m).   Weight as of this encounter: 175 lb (79.4 kg).  BMI interpretation table: BMI level Category Range association with higher incidence of chronic pain  <18 kg/m2 Underweight   18.5-24.9 kg/m2 Ideal body weight   25-29.9 kg/m2 Overweight Increased incidence by 20%  30-34.9 kg/m2 Obese (Class I) Increased incidence by 68%  35-39.9 kg/m2 Severe obesity (Class II) Increased incidence by 136%  >40 kg/m2 Extreme obesity (Class III) Increased incidence by 254%   Patient's current BMI Ideal Body weight  Body mass index is 24.41 kg/m. Ideal body weight: 75.3 kg (166 lb 0.1 oz) Adjusted ideal body weight: 76.9 kg (169 lb 9.7 oz)   BMI Readings from Last 4 Encounters:  10/15/17 24.41 kg/m  09/17/17 24.88 kg/m  08/26/17 23.71 kg/m  08/15/17 24.55 kg/m   Wt Readings from Last 4 Encounters:  10/15/17 175 lb (79.4 kg)  09/17/17 178 lb 6.4 oz (80.9 kg)  08/26/17 170 lb (77.1 kg)   08/15/17 176 lb (79.8 kg)  Psych/Mental status: Alert, oriented x 3 (person, place, & time)       Eyes: PERLA Respiratory: No evidence of acute respiratory distress  Cervical Spine Area Exam  Skin & Axial Inspection: No masses, redness, edema, swelling, or associated skin lesions Alignment: Symmetrical Functional ROM: Unrestricted ROM      Stability: No instability detected Muscle Tone/Strength: Functionally intact. No obvious neuro-muscular anomalies detected. Sensory (Neurological): Unimpaired Palpation: No palpable anomalies              Upper Extremity (UE) Exam    Side: Right upper extremity  Side: Left upper extremity  Skin & Extremity Inspection: Skin color, temperature, and hair growth are WNL. No peripheral edema or cyanosis. No masses, redness, swelling, asymmetry, or associated skin lesions. No contractures.  Skin & Extremity Inspection: Skin color, temperature, and hair growth are  WNL. No peripheral edema or cyanosis. No masses, redness, swelling, asymmetry, or associated skin lesions. No contractures.  Functional ROM: Unrestricted ROM          Functional ROM: Unrestricted ROM          Muscle Tone/Strength: Functionally intact. No obvious neuro-muscular anomalies detected.  Muscle Tone/Strength: Functionally intact. No obvious neuro-muscular anomalies detected.  Sensory (Neurological): Unimpaired          Sensory (Neurological): Unimpaired          Palpation: No palpable anomalies              Palpation: No palpable anomalies              Provocative Test(s):  Phalen's test: deferred Tinel's test: deferred Apley's scratch test (touch opposite shoulder):  Action 1 (Across chest): deferred Action 2 (Overhead): deferred Action 3 (LB reach): deferred   Provocative Test(s):  Phalen's test: deferred Tinel's test: deferred Apley's scratch test (touch opposite shoulder):  Action 1 (Across chest): deferred Action 2 (Overhead): deferred Action 3 (LB reach): deferred     Thoracic Spine Area Exam  Skin & Axial Inspection: No masses, redness, or swelling Alignment: Symmetrical Functional ROM: Unrestricted ROM Stability: No instability detected Muscle Tone/Strength: Functionally intact. No obvious neuro-muscular anomalies detected. Sensory (Neurological): Unimpaired Muscle strength & Tone: No palpable anomalies  Lumbar Spine Area Exam  Skin & Axial Inspection: Well healed scar from previous spine surgery detected( midline), IPG incision red and lateral edge with mild dehiscence. No drainage noted. Alignment: Symmetrical Functional ROM: Improved after treatment      Stability: No instability detected Muscle Tone/Strength: Functionally intact. No obvious neuro-muscular anomalies detected. Sensory (Neurological): Improved Palpation: No palpable anomalies       Provocative Tests: Lumbar Hyperextension and rotation test: evaluation deferred today       Lumbar Lateral bending test: evaluation deferred today       Patrick's Maneuver: evaluation deferred today                    Gait & Posture Assessment  Ambulation: Unassisted Gait: Relatively normal for age and body habitus Posture: WNL   Lower Extremity Exam    Side: Right lower extremity  Side: Left lower extremity  Skin & Extremity Inspection: Skin color, temperature, and hair growth are WNL. No peripheral edema or cyanosis. No masses, redness, swelling, asymmetry, or associated skin lesions. No contractures.  Skin & Extremity Inspection: Skin color, temperature, and hair growth are WNL. No peripheral edema or cyanosis. No masses, redness, swelling, asymmetry, or associated skin lesions. No contractures.  Functional ROM: Unrestricted ROM          Functional ROM: Unrestricted ROM          Muscle Tone/Strength: Functionally intact. No obvious neuro-muscular anomalies detected.  Muscle Tone/Strength: Functionally intact. No obvious neuro-muscular anomalies detected.  Sensory (Neurological):  Unimpaired  Sensory (Neurological): Unimpaired  Palpation: No palpable anomalies  Palpation: No palpable anomalies    Assessment  Primary Diagnosis & Pertinent Problem List: The primary encounter diagnosis was Failed back surgical syndrome. Diagnoses of Chronic left-sided low back pain with left-sided sciatica, Lumbosacral radiculopathy, Primary osteoarthritis of left ankle, Chronic pain syndrome, S/P insertion of spinal cord stimulator, Sciatica of left side associated with disorder of lumbar spine, and Chronic, continuous use of opioids were also pertinent to this visit.  Status Diagnosis  Controlled Controlled Controlled 1. Failed back surgical syndrome   2. Chronic left-sided low back pain with left-sided  sciatica   3. Lumbosacral radiculopathy   4. Primary osteoarthritis of left ankle   5. Chronic pain syndrome   6. S/P insertion of spinal cord stimulator   7. Sciatica of left side associated with disorder of lumbar spine   8. Chronic, continuous use of opioids     General Recommendations: The pain condition that the patient suffers from is best treated with a multidisciplinary approach that involves an increase in physical activity to prevent de-conditioning and worsening of the pain cycle, as well as psychological counseling (formal and/or informal) to address the co-morbid psychological affects of pain. Treatment will often involve judicious use of pain medications and interventional procedures to decrease the pain, allowing the patient to participate in the physical activity that will ultimately produce long-lasting pain reductions. The goal of the multidisciplinary approach is to return the patient to a higher level of overall function and to restore their ability to perform activities of daily living.  77 year old male with a history of chronic lumbosacral radiculopathy, failed back surgical syndrome status post NEVRO spinal cord stimulator paddle implant on 08/21/2017 who presents  for medication refill. Patient states that the spinal cord stimulator is helping out with his left leg pain and he is pleased with the results he has obtained in regards to improvement in his functional status. He is continuing to endorse left ankle pain secondary to peroneus brevis tendon tear. Received a cortisone injection in his left ankle which was only somewhat helpful last week.  Patient is also wearing a left ankle brace to limit his ankle inversion and eversion which he finds helpful.  Patient is continuing to take antibiotics in regards to his IPG incision site which has not healed completely since the paddle implant. Denies fevers, LE weakness, or any bowel/bladder issues. Patient is scheduled to see Dr. Cari Caraway later this week to evaluate IPG incision. Patient may require revision of SCS IPG, may consider deeper IPG implant at different site from current one but will defer to Dr Cari Caraway.  Plan of Care  Pharmacotherapy (Medications Ordered): Meds ordered this encounter  Medications  . DISCONTD: oxyCODONE-acetaminophen (PERCOCET) 7.5-325 MG tablet    Sig: Take 1 tablet by mouth 3 (three) times daily.    Dispense:  100 tablet    Refill:  0    For chronic pain To fill on or after: 11/08/17, 12/07/17 To last for 30 days from fill date  . oxyCODONE-acetaminophen (PERCOCET) 7.5-325 MG tablet    Sig: Take 1 tablet by mouth 3 (three) times daily.    Dispense:  100 tablet    Refill:  0    For chronic pain To fill on or after: 11/08/17, 12/07/17 To last for 30 days from fill date    Provider-requested follow-up: Return in about 3 months (around 01/15/2018) for Medication Management. Time Note: Greater than 50% of the 25 minute(s) of face-to-face time spent with Mr. Malerba, was spent in counseling/coordination of care regarding: Mr. Siegenthaler's primary cause of pain, the treatment plan, the appropriate use of his medications, realistic expectations, the goals of pain management (increased in  functionality) and the patient's responsibilities when it comes to controlled substances.  Future Appointments  Date Time Provider Junction  10/16/2017  9:30 AM Buena Irish, LCSW [facility] None  11/15/2017  4:00 PM O'Brien-Blaney, Denisa L, LPN [facility]  [id number]/[id number]  8:30 AM [name], MD [facility] None  02/04/2018  9:30 AM [facility]  03/19/2018 10:00 AM Leone Haven, MD [facility]  Primary Care Physician: Leone Haven, MD Location: [facility] Note by: [name], M.D Date: 10/15/2017; Time: 2:37 PM  There are no Patient Instructions on file for this visit.

## 2017-10-15 NOTE — Progress Notes (Signed)
Nursing Pain Medication Assessment:  Safety precautions to be maintained throughout the outpatient stay will include: orient to surroundings, keep bed in low position, maintain call bell within reach at all times, provide assistance with transfer out of bed and ambulation.  Medication Inspection Compliance: Pill count conducted under aseptic conditions, in front of the patient. Neither the pills nor the bottle was removed from the patient's sight at any time. Once count was completed pills were immediately returned to the patient in their original bottle.  Medication: Oxycodone/APAP Pill/Patch Count: 90 of 100 pills remain Pill/Patch Appearance: Markings consistent with prescribed medication Bottle Appearance: Standard pharmacy container. Clearly labeled. Filled Date: 05 / 22 / 2019 Last Medication intake:  Today

## 2017-10-16 ENCOUNTER — Ambulatory Visit: Payer: PPO | Admitting: Psychology

## 2017-10-16 DIAGNOSIS — F320 Major depressive disorder, single episode, mild: Secondary | ICD-10-CM | POA: Diagnosis not present

## 2017-10-18 ENCOUNTER — Other Ambulatory Visit: Payer: Self-pay | Admitting: Family Medicine

## 2017-10-21 ENCOUNTER — Encounter: Payer: PPO | Admitting: Student in an Organized Health Care Education/Training Program

## 2017-10-25 ENCOUNTER — Other Ambulatory Visit: Payer: Self-pay | Admitting: Family Medicine

## 2017-10-30 ENCOUNTER — Ambulatory Visit: Payer: PPO | Admitting: Psychology

## 2017-10-30 DIAGNOSIS — F320 Major depressive disorder, single episode, mild: Secondary | ICD-10-CM | POA: Diagnosis not present

## 2017-10-31 DIAGNOSIS — H532 Diplopia: Secondary | ICD-10-CM | POA: Diagnosis not present

## 2017-10-31 LAB — HM DIABETES EYE EXAM

## 2017-11-01 ENCOUNTER — Encounter: Payer: Self-pay | Admitting: Family Medicine

## 2017-11-11 ENCOUNTER — Other Ambulatory Visit: Payer: Self-pay | Admitting: Family Medicine

## 2017-11-11 DIAGNOSIS — M25572 Pain in left ankle and joints of left foot: Secondary | ICD-10-CM | POA: Diagnosis not present

## 2017-11-13 ENCOUNTER — Ambulatory Visit: Payer: PPO | Admitting: Psychology

## 2017-11-13 DIAGNOSIS — F320 Major depressive disorder, single episode, mild: Secondary | ICD-10-CM

## 2017-11-15 ENCOUNTER — Ambulatory Visit (INDEPENDENT_AMBULATORY_CARE_PROVIDER_SITE_OTHER): Payer: PPO

## 2017-11-15 VITALS — BP 106/64 | HR 73 | Temp 98.4°F | Resp 14 | Ht 69.0 in | Wt 183.8 lb

## 2017-11-15 DIAGNOSIS — Z0000 Encounter for general adult medical examination without abnormal findings: Secondary | ICD-10-CM

## 2017-11-15 NOTE — Progress Notes (Signed)
Subjective:   James Hood is a 77 y.o. male who presents for Medicare Annual/Subsequent preventive examination.  Review of Systems:  No ROS.  Medicare Wellness Visit. Additional risk factors are reflected in the social history. Cardiac Risk Factors include: advanced age (>34men, >47 women);male gender;hypertension     Objective:    Vitals: BP 106/64 (BP Location: Right Arm, Patient Position: Sitting, Cuff Size: Normal)   Pulse 73   Temp 98.4 F (36.9 C) (Oral)   Resp 14   Ht 5\' 9"  (1.753 m)   Wt 183 lb 12.8 oz (83.4 kg)   SpO2 95%   BMI 27.14 kg/m   Body mass index is 27.14 kg/m.  Advanced Directives 11/15/2017 08/15/2017 03/18/2017 01/22/2017 12/12/2016 11/14/2016 10/18/2016  Does Patient Have a Medical Advance Directive? Yes Yes Yes Yes Yes Yes Yes  Type of Paramedic of [place];Living will [place];Living will Living will;Healthcare Power of Attorney Living will Living will [place];Living will Living will  Does patient want to make changes to medical advance directive? No - Patient declined No - Patient declined - No - Patient declined No - Patient declined No - Patient declined No - Patient declined  Copy of [place] in Chart? Yes No - copy requested - - - Yes -    Tobacco Social History   Tobacco Use  Smoking Status Current Every Day Smoker  . Packs/day: 0.50  . Years: 60.00  . Pack years: 30.00  . Types: Cigarettes  . Last attempt to quit: 08/2016  . Years since quitting: 1.2  Smokeless Tobacco Never Used     Ready to quit: Not Answered Counseling given: Not Answered   Clinical Intake:  Pre-visit preparation completed: Yes  Pain : No/denies pain     Nutritional Status: BMI 25 -29 Overweight Diabetes: Yes(Followed by pcp)  How often do you need to have someone help you when you read instructions, pamphlets, or other written materials from your doctor or pharmacy?: 1  - Never  Interpreter Needed?: No     Past Medical History:  Diagnosis Date  . Anxiety   . Anxiety and depression   . Arthritis   . BPH (benign prostatic hyperplasia)   . 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 Colon polyps   . Dementia   . Depression   . Diabetes mellitus type 2 in nonobese (HCC)   . GERD (gastroesophageal reflux disease)    BARRETTS ESOPHAGUS RESOLVED PER PATIENT  . Headache   . 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 History of hiatal hernia   . Hypercholesteremia   . Hypertension   . Hyperthyroidism   . Neuropathic pain   . Primary localized osteoarthritis of right knee   . Stomach ulcer   . Tremor, essential    Past Surgical History:  Procedure Laterality Date  . COLONOSCOPY WITH PROPOFOL N/A 09/14/2016   Procedure: COLONOSCOPY WITH PROPOFOL;  Surgeon: Manya Silvas, MD;  Location: [facility] ENDOSCOPY;  Service: Endoscopy;  Laterality: N/A;  . esophageal stretch    . JOINT REPLACEMENT Right 2016   knee  . LUMBAR LAMINECTOMY/DECOMPRESSION MICRODISCECTOMY Left 02/03/2016   Procedure: LEFT L5-S1 DISKECTOMY;  Surgeon: Leeroy Cha, MD;  Location: [place] NEURO ORS;  Service: Neurosurgery;  Laterality: Left;  LEFT L5-S1 DISKECTOMY  . PULSE GENERATOR IMPLANT N/A 08/21/2017   Procedure: UNILATERAL PULSE GENERATOR IMPLANT;  Surgeon: Meade Maw, MD;  Location: [facility] ORS;  Service:  Neurosurgery;  Laterality: N/A;  . TONSILLECTOMY    . TOTAL KNEE ARTHROPLASTY Right 11/15/2014   Procedure: TOTAL KNEE ARTHROPLASTY;  Surgeon: Elsie Saas, MD;  Location: [place];  Service: Orthopedics;  Laterality: Right;   Family History  Problem Relation Age of Onset  . Heart attack Mother   . Heart disease Mother   . Hypertension Father   . Depression Father   . Kidney cancer Neg Hx   . Prostate cancer Neg Hx    Social History   Socioeconomic History  . Marital status: Married    Spouse name: Not on file  . Number of children: Not on file  . Years  of education: Not on file  . Highest education level: Not on file  Occupational History  . Not on file  Social Needs  . Financial resource strain: Not hard at all  . Food insecurity:    Worry: Never true    Inability: Never true  . Transportation needs:    Medical: No    Non-medical: No  Tobacco Use  . Smoking status: Current Every Day Smoker    Packs/day: 0.50    Years: 60.00    Pack years: 30.00    Types: Cigarettes    Last attempt to quit: 08/2016    Years since quitting: 1.2  . Smokeless tobacco: Never Used  Substance and Sexual Activity  . 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 Drug use: Never  . Sexual activity: Not Currently  Lifestyle  . Physical activity:    Days per week: Not on file    Minutes per session: Not on file  . Stress: Not at all  Relationships  . Social connections:    Talks on phone: Not on file    Gets together: Not on file    Attends religious service: Not on file    Active member of club or organization: Not on file    Attends meetings of clubs or organizations: Not on file    Relationship status: Not on file  Other Topics Concern  . Not on file  Social History Narrative  . Not on file    Outpatient Encounter Medications as of 11/15/2017  Medication Sig  . acetaminophen (TYLENOL) 500 MG tablet Take 1,000 mg by mouth every 6 (six) hours as needed (for pain.).   Marland Kitchen aspirin 81 MG tablet Take 81 mg by mouth daily.  Marland Kitchen buPROPion (WELLBUTRIN SR) 150 MG 12 hr tablet TAKE ONE TABLET BY MOUTH TWICE A DAY  . clindamycin (CLEOCIN) 300 MG capsule   . cyclobenzaprine (FLEXERIL) 10 MG tablet Take 1 tablet (10 mg total) by mouth 2 (two) times daily as needed for muscle spasms.  . diphenoxylate-atropine (LOMOTIL) 2.5-0.025 MG tablet TAKE ONE TABLET BY MOUTH FOUR TIMES A DAY  . divalproex (DEPAKOTE ER) 500 MG 24 hr tablet Take 500 mg by mouth daily.  Marland Kitchen donepezil (ARICEPT) 10  MG tablet Take 10 mg by mouth at bedtime.  . fenofibrate (TRICOR) 145 MG tablet TAKE ONE TABLET BY MOUTH DAILY  . ferrous sulfate 325 (65 FE) MG tablet Take 325 mg by mouth daily with breakfast.  . finasteride (PROSCAR) 5 MG tablet Take 1 tablet (5 mg total) by mouth daily.  Marland Kitchen gabapentin (NEURONTIN) 600 MG tablet TAKE TWO TABLETS BY MOUTH THREE TIMES A DAY  . lisinopril-hydrochlorothiazide (PRINZIDE,ZESTORETIC) 20-25 MG tablet TAKE ONE TABLET  BY MOUTH TWICE A DAY  . memantine (NAMENDA) 10 MG tablet Take 10 mg by mouth 2 (two) times daily.   . metFORMIN (GLUCOPHAGE) 500 MG tablet TAKE TWO TABLETS BY MOUTH TWICE A DAY WITH A MEAL  . metoprolol succinate (TOPROL-XL) 25 MG 24 hr tablet Take 25 mg by mouth at bedtime.   . naproxen (NAPROSYN) 500 MG tablet Take 500 mg by mouth 2 (two) times daily.  Marland Kitchen omeprazole (PRILOSEC) 20 MG capsule Take 20 mg by mouth 2 (two) times daily.   Marland Kitchen oxyCODONE-acetaminophen (PERCOCET) 7.5-325 MG tablet Take 1 tablet by mouth 3 (three) times daily.  . primidone (MYSOLINE) 50 MG tablet Take 250 mg by mouth 2 (two) times daily. Take 5 tablets (250 mg) by mouth in the morning & take 2 tablets (100 mg) by mouth at night.  . QUEtiapine (SEROQUEL) 25 MG tablet Take 100-125 mg by mouth at bedtime.  . simvastatin (ZOCOR) 40 MG tablet TAKE 1 TABLET (40 MG TOTAL) BY MOUTH DAILY.  . sitaGLIPtin (JANUVIA) 100 MG tablet Take 1 tablet (100 mg total) by mouth daily.  . tamsulosin (FLOMAX) 0.4 MG CAPS capsule TAKE ONE CAPSULE BY MOUTH DAILY AFTER SUPPER  . vitamin C (ASCORBIC ACID) 500 MG tablet Take 500 mg by mouth daily.  . [DISCONTINUED] divalproex (DEPAKOTE ER) 500 MG 24 hr tablet Take 500 mg by mouth daily.   No facility-administered encounter medications on file as of 11/15/2017.     Activities of Daily Living In your present state of health, do you have any difficulty performing the following activities: 11/15/2017 08/15/2017  Hearing? N N  Vision? N N  Difficulty concentrating  or making decisions? Y Y  Comment - some trouble remembering  Walking or climbing stairs? N N  Dressing or bathing? N N  Doing errands, shopping? N N  Comment He only drives short distances Facilities manager and eating ? N -  Using the Toilet? N -  In the past six months, have you accidently leaked urine? Y -  Comment Managed with a daily brief -  Do you have problems with loss of bowel control? N -  Managing your Medications? N -  Managing your Finances? Y -  Comment Wife assists -  Housekeeping or managing your Housekeeping? N -  Some recent data might be hidden    Patient Care Team: Leone Haven, MD as PCP - General (Family Medicine)   Assessment:   This is a routine wellness examination for Vir.  The goal of the wellness visit is to assist the patient how to close the gaps in care and create a preventative care plan for the patient.   The roster of all physicians providing medical care to patient is listed in the Snapshot section of the chart.  Osteoporosis risk reviewed.    Safety issues reviewed; Life and medic alert system in place. Smoke and carbon monoxide detectors in the home. No firearms in the home. Wears seatbelts when driving or riding with others. No violence in the home.  They do not have excessive sun exposure.  Discussed the need for sun protection: hats, long sleeves and the use of sunscreen if there is significant sun exposure.  Patient is alert, normal appearance, oriented to person/place/and time. Correctly identified the president of the [place] and recalls of 3/3 words.Performs simple calculations and can read correct time from watch face. Displays appropriate judgement.  No new identified risk were noted.  No failures at ADL's or  IADL's.    BMI- discussed the importance of a healthy diet, water intake and the benefits of aerobic exercise. Educational material provided.   24 hour diet recall: Regular diet  Dental- every 6 months.  Eye-  Visual acuity not assessed per patient preference since they have regular follow up with the ophthalmologist.  Wears corrective lenses.  Sleep patterns- Sleeps 8 hours at night.  Wakes feeling rested.   Health maintenance gaps- closed.  Patient Concerns: None at this time. Follow up with PCP as needed.  Exercise Activities and Dietary recommendations Current Exercise Habits: The patient does not participate in regular exercise at present  Goals    . Increase physical activity     Core, strength and training exercises 3 days weekly, 45-60 minutes     . Quit Smoking       Fall Risk Fall Risk  11/15/2017 10/15/2017 08/26/2017 07/29/2017 07/22/2017  Falls in the past year? No No No No No  Number falls in past yr: - - - - -  Comment - - - - -  Injury with Fall? - - - - -  Comment - - - - -  Risk Factor Category  - - - - -  Risk for fall due to : - - - - -  Follow up - - - - -  Comment - - - - -   Depression Screen PHQ 2/9 Scores 11/15/2017 04/02/2017 03/18/2017 01/22/2017  PHQ - 2 Score 0 3 0 0  PHQ- 9 Score - 8 - -  Exception Documentation - - - Patient refusal    Cognitive Function MMSE - Mini Mental State Exam 11/15/2017 11/14/2016  Orientation to time 5 5  Orientation to Place 5 5  Registration 3 3  Attention/ Calculation 5 5  Recall 3 3  Language- name 2 objects 2 2  Language- repeat 1 1  Language- follow 3 step command 3 3  Language- read & follow direction 1 1  Write a sentence 1 1  Copy design 1 1  Total score 30 30        Immunization History  Administered Date(s) Administered  . Influenza, High Dose Seasonal PF 01/29/2017  . Influenza,inj,Quad PF,6+ Mos 03/13/2016  . Influenza-Unspecified 01/21/2015  . Pneumococcal Conjugate-13 02/07/2015  . Tdap 09/16/2015  . Zoster 09/16/2015   Screening Tests Health Maintenance  Topic Date Due  . INFLUENZA VACCINE  12/19/2017  . HEMOGLOBIN A1C  03/28/2018  . FOOT EXAM  09/18/2018  . OPHTHALMOLOGY EXAM  11/01/2018    . TETANUS/TDAP  09/15/2025  . PNA vac Low Risk Adult  Completed       Plan:    End of life planning; Advance aging; Advanced directives discussed. Copy of current HCPOA/Living Will on file.    I have personally reviewed and noted the following in the patient's chart:   . Medical and social history . Use of alcohol, tobacco or illicit drugs  . Current medications and supplements . Functional ability and status . Nutritional status . Physical activity . Advanced directives . List of other physicians . Hospitalizations, surgeries, and ER visits in previous 12 months . Vitals . Screenings to include cognitive, depression, and falls . Referrals and appointments  In addition, I have reviewed and discussed with patient certain preventive protocols, quality metrics, and best practice recommendations. A written personalized care plan for preventive services as well as general preventive health recommendations were provided to patient.     OBrien-Blaney, Ian Castagna L, LPN  11/15/2017   

## 2017-11-15 NOTE — Patient Instructions (Addendum)
  [name] , Thank you for taking time to come for your Medicare Wellness Visit. I appreciate your ongoing commitment to your health goals. Please review the following plan we discussed and let me know if I can assist you in the future.   Follow up as needed.    Routine maintenance appointments with your doctor.   Have a great day!  These are the goals we discussed: Goals    . Increase physical activity     Core, strength and training exercises 3 days weekly, 45-60 minutes     . Quit Smoking       This is a list of the screening recommended for you and due dates:  Health Maintenance  Topic Date Due  . Flu Shot  12/19/2017  . Hemoglobin A1C  03/28/2018  . Complete foot exam   09/18/2018  . Eye exam for diabetics  11/01/2018  . Tetanus Vaccine  09/15/2025  . Pneumonia vaccines  Completed

## 2017-11-20 DIAGNOSIS — H903 Sensorineural hearing loss, bilateral: Secondary | ICD-10-CM | POA: Diagnosis not present

## 2017-11-27 ENCOUNTER — Other Ambulatory Visit: Payer: Self-pay | Admitting: Family Medicine

## 2017-11-29 ENCOUNTER — Ambulatory Visit: Payer: PPO | Admitting: Psychology

## 2017-11-29 DIAGNOSIS — F320 Major depressive disorder, single episode, mild: Secondary | ICD-10-CM | POA: Diagnosis not present

## 2017-12-11 DIAGNOSIS — M25572 Pain in left ankle and joints of left foot: Secondary | ICD-10-CM | POA: Diagnosis not present

## 2017-12-13 ENCOUNTER — Ambulatory Visit: Payer: Self-pay | Admitting: Psychology

## 2017-12-19 ENCOUNTER — Other Ambulatory Visit: Payer: Self-pay | Admitting: Family Medicine

## 2017-12-26 DIAGNOSIS — M961 Postlaminectomy syndrome, not elsewhere classified: Secondary | ICD-10-CM | POA: Diagnosis not present

## 2017-12-30 ENCOUNTER — Ambulatory Visit: Payer: Self-pay | Admitting: Psychology

## 2018-01-08 DIAGNOSIS — M25572 Pain in left ankle and joints of left foot: Secondary | ICD-10-CM | POA: Diagnosis not present

## 2018-01-14 ENCOUNTER — Ambulatory Visit
Payer: PPO | Attending: Student in an Organized Health Care Education/Training Program | Admitting: Student in an Organized Health Care Education/Training Program

## 2018-01-14 ENCOUNTER — Other Ambulatory Visit: Payer: Self-pay

## 2018-01-14 VITALS — BP 119/76 | HR 76 | Temp 97.9°F | Resp 16 | Ht 70.0 in | Wt 180.0 lb

## 2018-01-14 DIAGNOSIS — F329 Major depressive disorder, single episode, unspecified: Secondary | ICD-10-CM | POA: Insufficient documentation

## 2018-01-14 DIAGNOSIS — G894 Chronic pain syndrome: Secondary | ICD-10-CM | POA: Diagnosis not present

## 2018-01-14 DIAGNOSIS — F17210 Nicotine dependence, cigarettes, uncomplicated: Secondary | ICD-10-CM | POA: Insufficient documentation

## 2018-01-14 DIAGNOSIS — Z8719 Personal history of other diseases of the digestive system: Secondary | ICD-10-CM | POA: Insufficient documentation

## 2018-01-14 DIAGNOSIS — M1711 Unilateral primary osteoarthritis, right knee: Secondary | ICD-10-CM | POA: Diagnosis not present

## 2018-01-14 DIAGNOSIS — Z7982 Long term (current) use of aspirin: Secondary | ICD-10-CM | POA: Diagnosis not present

## 2018-01-14 DIAGNOSIS — E119 Type 2 diabetes mellitus without complications: Secondary | ICD-10-CM | POA: Insufficient documentation

## 2018-01-14 DIAGNOSIS — I10 Essential (primary) hypertension: Secondary | ICD-10-CM | POA: Insufficient documentation

## 2018-01-14 DIAGNOSIS — F419 Anxiety disorder, unspecified: Secondary | ICD-10-CM | POA: Insufficient documentation

## 2018-01-14 DIAGNOSIS — K449 Diaphragmatic hernia without obstruction or gangrene: Secondary | ICD-10-CM | POA: Insufficient documentation

## 2018-01-14 DIAGNOSIS — M25572 Pain in left ankle and joints of left foot: Secondary | ICD-10-CM | POA: Insufficient documentation

## 2018-01-14 DIAGNOSIS — M961 Postlaminectomy syndrome, not elsewhere classified: Secondary | ICD-10-CM

## 2018-01-14 DIAGNOSIS — M25551 Pain in right hip: Secondary | ICD-10-CM | POA: Diagnosis not present

## 2018-01-14 DIAGNOSIS — M7062 Trochanteric bursitis, left hip: Secondary | ICD-10-CM | POA: Diagnosis not present

## 2018-01-14 DIAGNOSIS — E785 Hyperlipidemia, unspecified: Secondary | ICD-10-CM | POA: Insufficient documentation

## 2018-01-14 DIAGNOSIS — E7800 Pure hypercholesterolemia, unspecified: Secondary | ICD-10-CM | POA: Diagnosis not present

## 2018-01-14 DIAGNOSIS — Z5181 Encounter for therapeutic drug level monitoring: Secondary | ICD-10-CM | POA: Insufficient documentation

## 2018-01-14 DIAGNOSIS — G8929 Other chronic pain: Secondary | ICD-10-CM

## 2018-01-14 DIAGNOSIS — M19072 Primary osteoarthritis, left ankle and foot: Secondary | ICD-10-CM | POA: Diagnosis not present

## 2018-01-14 DIAGNOSIS — Z8249 Family history of ischemic heart disease and other diseases of the circulatory system: Secondary | ICD-10-CM | POA: Insufficient documentation

## 2018-01-14 DIAGNOSIS — Z7984 Long term (current) use of oral hypoglycemic drugs: Secondary | ICD-10-CM | POA: Insufficient documentation

## 2018-01-14 DIAGNOSIS — Z9889 Other specified postprocedural states: Secondary | ICD-10-CM

## 2018-01-14 DIAGNOSIS — K219 Gastro-esophageal reflux disease without esophagitis: Secondary | ICD-10-CM | POA: Insufficient documentation

## 2018-01-14 DIAGNOSIS — M5417 Radiculopathy, lumbosacral region: Secondary | ICD-10-CM | POA: Diagnosis not present

## 2018-01-14 DIAGNOSIS — N400 Enlarged prostate without lower urinary tract symptoms: Secondary | ICD-10-CM | POA: Insufficient documentation

## 2018-01-14 DIAGNOSIS — M5442 Lumbago with sciatica, left side: Secondary | ICD-10-CM | POA: Diagnosis not present

## 2018-01-14 DIAGNOSIS — Z9689 Presence of other specified functional implants: Secondary | ICD-10-CM

## 2018-01-14 DIAGNOSIS — K2270 Barrett's esophagus without dysplasia: Secondary | ICD-10-CM | POA: Diagnosis not present

## 2018-01-14 DIAGNOSIS — Z969 Presence of functional implant, unspecified: Secondary | ICD-10-CM | POA: Diagnosis not present

## 2018-01-14 DIAGNOSIS — M47816 Spondylosis without myelopathy or radiculopathy, lumbar region: Secondary | ICD-10-CM | POA: Diagnosis not present

## 2018-01-14 DIAGNOSIS — Z79899 Other long term (current) drug therapy: Secondary | ICD-10-CM | POA: Insufficient documentation

## 2018-01-14 MED ORDER — OXYCODONE-ACETAMINOPHEN 7.5-325 MG PO TABS: 1.0000 | ORAL_TABLET | Freq: Four times a day (QID) | ORAL | 0 refills | Status: DC | PRN

## 2018-01-14 MED ORDER — CYCLOBENZAPRINE HCL 10 MG PO TABS: 10.0000 mg | ORAL_TABLET | Freq: Two times a day (BID) | ORAL | 2 refills | Status: DC | PRN

## 2018-01-14 NOTE — Patient Instructions (Addendum)
Recommend Aquaphor cream to IPG incision site twice a day after shower.  Medication refill for 3 months as below. You have been e scribed 3 oxycodone and 1 Flexeril script

## 2018-01-14 NOTE — Progress Notes (Signed)
Patient's Name: James Hood  MRN: [id number]  Referring Provider: Leone Haven, MD  DOB: January 27, 1941  PCP: Leone Haven, MD  DOS: 01/14/2018  Note by: [name], MD  Service setting: Ambulatory outpatient  Specialty: Interventional Pain Management  Location: [facility] (AMB) Pain Management Facility    Patient type: Established   Primary Reason(s) for Visit: Encounter for prescription drug management. (Level of risk: moderate)  CC: Ankle Pain (left)  HPI  James Hood is a 77 y.o. year old, male patient, who comes today for a medication management evaluation. He has Hypertension; Tremor, essential; Primary localized osteoarthritis of right knee; Anxiety and depression; DJD (degenerative joint disease) of knee; Trochanteric bursitis of left hip; Headache; Falls; Barrett's esophagus; Chronic lumbar pain; Benign prostatic hyperplasia; Benign essential tremor; Benign neoplasm of colon; Chronic diarrhea; Type 2 diabetes mellitus ([place]); Difficulty in walking; HLD (hyperlipidemia); Amnesia; Testicular hypofunction; Absence of sensation; Episode of syncope; Hypertriglyceridemia; Anemia; Lumbar herniated disc; Night sweats; Right hip pain; Purpura ([place]); Thyroid nodule; Fatty liver; S/P insertion of spinal cord stimulator; Chronic pain syndrome; and Failed back surgical syndrome on their problem list. His primarily concern today is the Ankle Pain (left)  Pain Assessment: Location: Left Ankle Radiating: denies Onset: More than a month ago Duration: Chronic pain Quality: Aching, Constant Severity: 4 /10 (subjective, self-reported pain score)  Note: Reported level is compatible with observation.                         When using our objective Pain Scale, levels between 6 and 10/10 are said to belong in an emergency room, as it progressively worsens from a 6/10, described as severely limiting, requiring emergency care not usually available at an outpatient pain management facility. At a 6/10  level, communication becomes difficult and requires great effort. Assistance to reach the emergency department may be required. Facial flushing and profuse sweating along with potentially dangerous increases in heart rate and blood pressure will be evident. Effect on ADL: "I have difficulty walking" Timing: Constant Modifying factors: medication,  BP: 119/76  HR: 76  Mr. Isakson was last scheduled for an appointment on 10/15/2017 for medication management. During today's appointment we reviewed James Hood's chronic pain status, as well as his outpatient medication regimen.  Patient follows up today for medication refill.  He saw Dr. Cari Caraway earlier this month for IPG incision check.  He has been released from Dr. Nelly Laurence care has been told to follow-up as needed.  Patient states that his scab is better.  He states that his spinal cord stimulator is helping his chronic pain symptoms as well as his functional status.  The patient  reports that he does not use drugs. His body mass index is 25.83 kg/m.  Further details on both, my assessment(s), as well as the proposed treatment plan, please see below.  Controlled Substance Pharmacotherapy Assessment REMS (Risk Evaluation and Mitigation Strategy)  Analgesic: Percocet 7.5 mg 3 times daily as needed, quantity 100/month MME/day: 34 mg/day.  Dewayne Shorter, RN  01/14/2018  8:32 AM  Signed Nursing Pain Medication Assessment:  Safety precautions to be maintained throughout the outpatient stay will include: orient to surroundings, keep bed in low position, maintain call bell within reach at all times, provide assistance with transfer out of bed and ambulation.  Medication Inspection Compliance: Pill count conducted under aseptic conditions, in front of the patient. Neither the pills nor the bottle was removed from the patient's sight at any  time. Once count was completed pills were immediately returned to the patient in their original  bottle.  Medication: Oxycodone/APAP Pill/Patch Count: 30 of 100 pills remain Pill/Patch Appearance: Markings consistent with prescribed medication Bottle Appearance: Standard pharmacy container. Clearly labeled. Filled Date: 08 / 02 / 2019 Last Medication intake:  Today   Pharmacokinetics: Liberation and absorption (onset of action): WNL Distribution (time to peak effect): WNL Metabolism and excretion (duration of action): WNL         Pharmacodynamics: Desired effects: Analgesia: James Hood reports >50% benefit. Functional ability: Patient reports that medication allows him to accomplish basic ADLs Clinically meaningful improvement in function (CMIF): Sustained CMIF goals met Perceived effectiveness: Described as relatively effective, allowing for increase in activities of daily living (ADL) Undesirable effects: Side-effects or Adverse reactions: None reported Monitoring: [place] PMP: Online review of the past 68-monthperiod conducted. Compliant with practice rules and regulations Last UDS on record: No results found for: SUMMARY UDS interpretation: Compliant          Medication Assessment Form: Reviewed. Patient indicates being compliant with therapy Treatment compliance: Compliant Risk Assessment Profile: Aberrant behavior: See prior evaluations. None observed or detected today Comorbid factors increasing risk of overdose: See prior notes. No additional risks detected today Opioid risk tool (ORT) (Total Score): 3 Personal History of Substance Abuse (SUD-Substance use disorder):  Alcohol: Positive Male or Male  Illegal Drugs: Negative  Rx Drugs: Negative  ORT Risk Level calculation: Low Risk Risk of substance use disorder (SUD): Low Opioid Risk Tool - 01/14/18 0830      Family History of Substance Abuse   Alcohol  Negative    Illegal Drugs  Negative    Rx Drugs  Negative      Personal History of Substance Abuse   Alcohol  Positive Male or Male    Illegal Drugs   Negative    Rx Drugs  Negative      Age   Age between 117-45years   No      History of Preadolescent Sexual Abuse   History of Preadolescent Sexual Abuse  Negative or Male      Psychological Disease   Psychological Disease  Negative    Depression  Negative      Total Score   Opioid Risk Tool Scoring  3    Opioid Risk Interpretation  Low Risk      ORT Scoring interpretation table:  Score <3 = Low Risk for SUD  Score between 4-7 = Moderate Risk for SUD  Score >8 = High Risk for Opioid Abuse   Risk Mitigation Strategies:  Patient Counseling: Covered Patient-Prescriber Agreement (PPA): Present and active  Notification to other healthcare providers: Done  Pharmacologic Plan: No change in therapy, at this time.             Laboratory Chemistry  Inflammation Markers (CRP: Acute Phase) (ESR: Chronic Phase) Lab Results  Component Value Date   ESRSEDRATE 11 01/29/2017                         Rheumatology Markers No results found for: RF, ANA, LABURIC, URICUR, LYMEIGGIGMAB, LYMEABIGMQN, HLAB27                      Renal Function Markers Lab Results  Component Value Date   BUN 27 (H) 09/25/2017   CREATININE 1.20 09/25/2017   GFRAA >60 08/15/2017   GFRNONAA >60 08/15/2017  Hepatic Function Markers Lab Results  Component Value Date   AST 20 09/25/2017   ALT 13 09/25/2017   ALBUMIN 3.9 09/25/2017   ALKPHOS 32 (L) 09/25/2017   AMMONIA 14 08/09/2016                        Electrolytes Lab Results  Component Value Date   NA 139 09/25/2017   K 4.5 09/25/2017   CL 101 09/25/2017   CALCIUM 9.2 09/25/2017                        Neuropathy Markers Lab Results  Component Value Date   HGBA1C 6.8 (H) 09/25/2017   HIV NONREACTIVE 06/19/2016                        Bone Pathology Markers No results found for: [place], [id number], [id number], [id number], 25OHVITD1, 25OHVITD2, 25OHVITD3, TESTOFREE, TESTOSTERONE                       Coagulation  Parameters Lab Results  Component Value Date   INR 0.92 08/15/2017   LABPROT 12.3 08/15/2017   APTT 34 08/15/2017   PLT 273 08/15/2017                        Cardiovascular Markers Lab Results  Component Value Date   CKTOTAL 468 (H) 08/14/2016   HGB 11.4 (L) 08/15/2017   HCT 34.4 (L) 08/15/2017                         CA Markers No results found for: CEA, CA125, LABCA2                      Note: Lab results reviewed.  Recent Diagnostic Imaging Results  DG Thoracic Spine 1 View CLINICAL DATA:  Intraspinal stimulator placement  EXAM: OPERATIVE THORACIC SPINE 3 VIEW(S)  COMPARISON:  CT chest 01/11/2017  FLUOROSCOPY TIME:  0 minutes 14.9 seconds  Does 6.62 mGy  FINDINGS: Osseous demineralization.  Images demonstrate placement of an intraspinal stimulator at the caudal aspect of the thoracic spine.  Lead appears to be present at the T8 and T10 levels.  IMPRESSION: Intraspinal stimulator placement.  Electronically Signed   By: Lavonia Dana M.D.   On: 08/21/2017 10:13 DG C-Arm 1-60 Min CLINICAL DATA:  Intraspinal stimulator placement  EXAM: OPERATIVE THORACIC SPINE 3 VIEW(S)  COMPARISON:  CT chest 01/11/2017  FLUOROSCOPY TIME:  0 minutes 14.9 seconds  Does 6.62 mGy  FINDINGS: Osseous demineralization.  Images demonstrate placement of an intraspinal stimulator at the caudal aspect of the thoracic spine.  Lead appears to be present at the T8 and T10 levels.  IMPRESSION: Intraspinal stimulator placement.  Electronically Signed   By: Lavonia Dana M.D.   On: 08/21/2017 10:13  Complexity Note: Imaging results reviewed. Results shared with Mr. Wulf, using Layman's terms.                         Meds   Current Outpatient Medications:  .  acetaminophen (TYLENOL) 500 MG tablet, Take 1,000 mg by mouth every 6 (six) hours as needed (for pain.). , Disp: , Rfl:  .  aspirin 81 MG tablet, Take 81 mg by mouth daily., Disp: , Rfl:  .  buPROPion (WELLBUTRIN  SR) 150 MG 12  hr tablet, TAKE ONE TABLET BY MOUTH TWICE A DAY, Disp: 180 tablet, Rfl: 0 .  cyclobenzaprine (FLEXERIL) 10 MG tablet, Take 1 tablet (10 mg total) by mouth 2 (two) times daily as needed for muscle spasms., Disp: 60 tablet, Rfl: 2 .  diphenoxylate-atropine (LOMOTIL) 2.5-0.025 MG tablet, TAKE ONE TABLET BY MOUTH FOUR TIMES A DAY, Disp: , Rfl:  .  divalproex (DEPAKOTE ER) 500 MG 24 hr tablet, Take 500 mg by mouth daily., Disp: , Rfl:  .  donepezil (ARICEPT) 10 MG tablet, Take 10 mg by mouth at bedtime., Disp: , Rfl:  .  fenofibrate (TRICOR) 145 MG tablet, TAKE ONE TABLET BY MOUTH DAILY, Disp: 90 tablet, Rfl: 1 .  ferrous sulfate 325 (65 FE) MG tablet, Take 325 mg by mouth daily with breakfast., Disp: , Rfl:  .  finasteride (PROSCAR) 5 MG tablet, Take 1 tablet (5 mg total) by mouth daily., Disp: 30 tablet, Rfl: 11 .  gabapentin (NEURONTIN) 600 MG tablet, TAKE TWO TABLETS BY MOUTH THREE TIMES A DAY, Disp: 180 tablet, Rfl: 3 .  lisinopril-hydrochlorothiazide (PRINZIDE,ZESTORETIC) 20-25 MG tablet, TAKE ONE TABLET BY MOUTH TWICE A DAY, Disp: 180 tablet, Rfl: 0 .  memantine (NAMENDA) 10 MG tablet, Take 10 mg by mouth 2 (two) times daily. , Disp: , Rfl:  .  metFORMIN (GLUCOPHAGE) 500 MG tablet, TAKE TWO TABLETS BY MOUTH TWICE A DAY WITH A MEAL, Disp: 360 tablet, Rfl: 1 .  metoprolol succinate (TOPROL-XL) 25 MG 24 hr tablet, Take 25 mg by mouth at bedtime. , Disp: , Rfl:  .  naproxen (NAPROSYN) 500 MG tablet, Take 500 mg by mouth 2 (two) times daily., Disp: , Rfl:  .  omeprazole (PRILOSEC) 20 MG capsule, Take 20 mg by mouth 2 (two) times daily. , Disp: , Rfl:  .  [START ON 01/19/2018] oxyCODONE-acetaminophen (PERCOCET) 7.5-325 MG tablet, Take 1 tablet by mouth every 6 (six) hours as needed for severe pain., Disp: 100 tablet, Rfl: 0 .  primidone (MYSOLINE) 50 MG tablet, Take 250 mg by mouth 2 (two) times daily. Take 5 tablets (250 mg) by mouth in the morning & take 2 tablets (100 mg) by mouth at  night., Disp: , Rfl:  .  QUEtiapine (SEROQUEL) 25 MG tablet, Take 100-125 mg by mouth at bedtime., Disp: , Rfl:  .  simvastatin (ZOCOR) 40 MG tablet, TAKE 1 TABLET (40 MG TOTAL) BY MOUTH DAILY., Disp: 90 tablet, Rfl: 0 .  sitaGLIPtin (JANUVIA) 100 MG tablet, Take 1 tablet (100 mg total) by mouth daily., Disp: 30 tablet, Rfl: 2 .  tamsulosin (FLOMAX) 0.4 MG CAPS capsule, TAKE ONE CAPSULE BY MOUTH DAILY AFTER SUPPER, Disp: 90 capsule, Rfl: 2 .  vitamin C (ASCORBIC ACID) 500 MG tablet, Take 500 mg by mouth daily., Disp: , Rfl:  .  clindamycin (CLEOCIN) 300 MG capsule, , Disp: , Rfl:  .  [START ON 02/18/2018] oxyCODONE-acetaminophen (PERCOCET) 7.5-325 MG tablet, Take 1 tablet by mouth every 6 (six) hours as needed for moderate pain or severe pain., Disp: 100 tablet, Rfl: 0 .  [START ON 03/20/2018] oxyCODONE-acetaminophen (PERCOCET) 7.5-325 MG tablet, Take 1 tablet by mouth every 6 (six) hours as needed for moderate pain or severe pain., Disp: 100 tablet, Rfl: 0  ROS  Constitutional: Denies any fever or chills Gastrointestinal: No reported hemesis, hematochezia, vomiting, or acute GI distress Musculoskeletal: Denies any acute onset joint swelling, redness, loss of ROM, or weakness Neurological: No reported episodes of acute onset apraxia, aphasia, dysarthria, agnosia, amnesia,  paralysis, loss of coordination, or loss of consciousness  Allergies  Mr. Douthit is allergic to no known allergies.  [place]  Drug: Mr. Haack  reports that he does not use drugs. Alcohol:  reports that he drank alcohol. Tobacco:  reports that he has been smoking cigarettes. He has a 30.00 pack-year smoking history. He has never used smokeless tobacco. 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), Colon polyps, Dementia, Depression, Diabetes mellitus type 2 in nonobese ([place]), GERD  (gastroesophageal reflux disease), Headache, 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[place]), History of hiatal hernia, Hypercholesteremia, Hypertension, Hyperthyroidism, Neuropathic pain, Primary localized osteoarthritis of right knee, Stomach ulcer, and Tremor, essential. Surgical: Mr. Reitter  has a past surgical history that includes esophageal stretch; Tonsillectomy; Total knee arthroplasty (Right, 11/15/2014); Lumbar laminectomy/decompression microdiscectomy (Left, 02/03/2016); Colonoscopy with propofol (N/A, 09/14/2016); Joint replacement (Right, 2016); and Pulse generator implant (N/A, 08/21/2017). Family: family history includes Depression in his father; Heart attack in his mother; Heart disease in his mother; Hypertension in his father.  Constitutional Exam  General appearance: Well nourished, well developed, and well hydrated. In no apparent acute distress Vitals:   01/14/18 0824  BP: 119/76  Pulse: 76  Resp: 16  Temp: 97.9 F (36.6 C)  SpO2: 94%  Weight: 180 lb (81.6 kg)  Height: 5' 10" (1.778 m)   BMI Assessment: Estimated body mass index is 25.83 kg/m as calculated from the following:   Height as of this encounter: 5' 10" (1.778 m).   Weight as of this encounter: 180 lb (81.6 kg).  BMI interpretation table: BMI level Category Range association with higher incidence of chronic pain  <18 kg/m2 Underweight   18.5-24.9 kg/m2 Ideal body weight   25-29.9 kg/m2 Overweight Increased incidence by 20%  30-34.9 kg/m2 Obese (Class I) Increased incidence by 68%  35-39.9 kg/m2 Severe obesity (Class II) Increased incidence by 136%  >40 kg/m2 Extreme obesity (Class III) Increased incidence by 254%   Patient's current BMI Ideal Body weight  Body mass index is 25.83 kg/m. Ideal body weight: 73 kg (160 lb 15 oz) Adjusted ideal body weight: 76.5 kg (168 lb 9 oz)   BMI Readings from Last 4 Encounters:  01/14/18 25.83 kg/m  11/15/17 27.14 kg/m  10/15/17 24.41 kg/m  09/17/17 24.88 kg/m   Wt Readings  from Last 4 Encounters:  01/14/18 180 lb (81.6 kg)  11/15/17 183 lb 12.8 oz (83.4 kg)  10/15/17 175 lb (79.4 kg)  09/17/17 178 lb 6.4 oz (80.9 kg)  Psych/Mental status: Alert, oriented x 3 (person, place, & time)       Eyes: PERLA Respiratory: No evidence of acute respiratory distress  Cervical Spine Area Exam  Skin & Axial Inspection: No masses, redness, edema, swelling, or associated skin lesions Alignment: Symmetrical Functional ROM: Unrestricted ROM      Stability: No instability detected Muscle Tone/Strength: Functionally intact. No obvious neuro-muscular anomalies detected. Sensory (Neurological): Unimpaired Palpation: No palpable anomalies              Upper Extremity (UE) Exam    Side: Right upper extremity  Side: Left upper extremity  Skin & Extremity Inspection: Skin color, temperature, and hair growth are WNL. No peripheral edema or cyanosis. No masses, redness, swelling, asymmetry, or associated skin lesions. No contractures.  Skin & Extremity Inspection: Skin color, temperature, and hair growth are WNL. No peripheral edema or cyanosis. No masses, redness, swelling, asymmetry,  or associated skin lesions. No contractures.  Functional ROM: Unrestricted ROM          Functional ROM: Unrestricted ROM          Muscle Tone/Strength: Functionally intact. No obvious neuro-muscular anomalies detected.  Muscle Tone/Strength: Functionally intact. No obvious neuro-muscular anomalies detected.  Sensory (Neurological): Unimpaired          Sensory (Neurological): Unimpaired          Palpation: No palpable anomalies              Palpation: No palpable anomalies              Provocative Test(s):  Phalen's test: deferred Tinel's test: deferred Apley's scratch test (touch opposite shoulder):  Action 1 (Across chest): deferred Action 2 (Overhead): deferred Action 3 (LB reach): deferred   Provocative Test(s):  Phalen's test: deferred Tinel's test: deferred Apley's scratch test (touch  opposite shoulder):  Action 1 (Across chest): deferred Action 2 (Overhead): deferred Action 3 (LB reach): deferred    Thoracic Spine Area Exam  Skin & Axial Inspection: No masses, redness, or swelling Alignment: Symmetrical Functional ROM: Unrestricted ROM Stability: No instability detected Muscle Tone/Strength: Functionally intact. No obvious neuro-muscular anomalies detected. Sensory (Neurological): Unimpaired Muscle strength & Tone: No palpable anomalies  Lumbar Spine Area Exam  Skin & Axial Inspection: Well healed scar from previous spine surgery detected at midline, improved scar over right buttock IPG.  No erythema or drainage noticed.  No allodynia to touch. Alignment: Symmetrical Functional ROM: Improved after treatment       Stability: No instability detected Muscle Tone/Strength: Functionally intact. No obvious neuro-muscular anomalies detected. Sensory (Neurological): Dermatomal pain pattern and musculoskeletal Palpation: No palpable anomalies       Provocative Tests: Hyperextension/rotation test: deferred today       Lumbar quadrant test (Kemp's test): deferred today       Lateral bending test: deferred today       Patrick's Maneuver: deferred today                   FABER test: deferred today                   S-I anterior distraction/compression test: deferred today         S-I lateral compression test: deferred today         S-I Thigh-thrust test: deferred today         S-I Gaenslen's test: deferred today          Gait & Posture Assessment  Ambulation: Unassisted Gait: Relatively normal for age and body habitus Posture: WNL   Lower Extremity Exam    Side: Right lower extremity  Side: Left lower extremity  Stability: No instability observed          Stability: No instability observed          Skin & Extremity Inspection: Skin color, temperature, and hair growth are WNL. No peripheral edema or cyanosis. No masses, redness, swelling, asymmetry, or associated skin  lesions. No contractures.  Skin & Extremity Inspection: Skin color, temperature, and hair growth are WNL. No peripheral edema or cyanosis. No masses, redness, swelling, asymmetry, or associated skin lesions. No contractures.  Functional ROM: Unrestricted ROM                  Functional ROM: Unrestricted ROM                  Muscle Tone/Strength: Functionally  intact. No obvious neuro-muscular anomalies detected.  Muscle Tone/Strength: Functionally intact. No obvious neuro-muscular anomalies detected.  Sensory (Neurological): Unimpaired  Sensory (Neurological): Unimpaired  Palpation: No palpable anomalies  Palpation: No palpable anomalies   Assessment  Primary Diagnosis & Pertinent Problem List: The primary encounter diagnosis was Failed back surgical syndrome. Diagnoses of Chronic left-sided low back pain with left-sided sciatica, Lumbosacral radiculopathy, Primary osteoarthritis of left ankle, Chronic pain syndrome, S/P insertion of spinal cord stimulator, and Facet arthritis of lumbar region were also pertinent to this visit.  Status Diagnosis  Controlled Controlled Controlled 1. Failed back surgical syndrome   2. Chronic left-sided low back pain with left-sided sciatica   3. Lumbosacral radiculopathy   4. Primary osteoarthritis of left ankle   5. Chronic pain syndrome   6. S/P insertion of spinal cord stimulator   7. Facet arthritis of lumbar region      General Recommendations: The pain condition that the patient suffers from is best treated with a multidisciplinary approach that involves an increase in physical activity to prevent de-conditioning and worsening of the pain cycle, as well as psychological counseling (formal and/or informal) to address the co-morbid psychological affects of pain. Treatment will often involve judicious use of pain medications and interventional procedures to decrease the pain, allowing the patient to participate in the physical activity that will ultimately  produce long-lasting pain reductions. The goal of the multidisciplinary approach is to return the patient to a higher level of overall function and to restore their ability to perform activities of daily living.  77 year old male with a history of chronic lumbosacral radiculopathy, failed back surgical syndrome status post NEVRO spinal cord stimulator paddle implant on 08/21/2017 who presents for medication refill. Patient states that the spinal cord stimulator is helping out with his left leg pain and he is pleased with the results he has obtained in regards to improvement in his functional status. He is continuing to endorse left ankle pain secondary to peroneusbrevis tendon tear.  Patient follows up today for medication refill.  He saw Dr. Cari Caraway earlier this month for IPG incision check.  He has been released from Dr. Nelly Laurence care has been told to follow-up as needed.  Patient states that his scab is better.  He states that his spinal cord stimulator is helping his chronic pain symptoms as well as his functional status.  I will refill the patient's chronic pain medications as below including oxycodone at its previous dose as well as Flexeril at his previous dose.  I also recommended the patient try Aquaphor from over-the-counter and apply that to his IPG incision site twice a day which could help out with the scar.  Plan of Care  Pharmacotherapy (Medications Ordered): Meds ordered this encounter  Medications  . oxyCODONE-acetaminophen (PERCOCET) 7.5-325 MG tablet    Sig: Take 1 tablet by mouth every 6 (six) hours as needed for severe pain.    Dispense:  100 tablet    Refill:  0  . oxyCODONE-acetaminophen (PERCOCET) 7.5-325 MG tablet    Sig: Take 1 tablet by mouth every 6 (six) hours as needed for moderate pain or severe pain.    Dispense:  100 tablet    Refill:  0    Do not place this medication, or any other prescription from our practice, on "Automatic Refill". Patient may have  prescription filled one day early if pharmacy is closed on scheduled refill date.  Marland Kitchen oxyCODONE-acetaminophen (PERCOCET) 7.5-325 MG tablet    Sig: Take 1 tablet  by mouth every 6 (six) hours as needed for moderate pain or severe pain.    Dispense:  100 tablet    Refill:  0    Do not place this medication, or any other prescription from our practice, on "Automatic Refill". Patient may have prescription filled one day early if pharmacy is closed on scheduled refill date.  . cyclobenzaprine (FLEXERIL) 10 MG tablet    Sig: Take 1 tablet (10 mg total) by mouth 2 (two) times daily as needed for muscle spasms.    Dispense:  60 tablet    Refill:  2    Do not place this medication, or any other prescription from our practice, on "Automatic Refill". Patient may have prescription filled one day early if pharmacy is closed on scheduled refill date.   Time Note: Greater than 50% of the 25 minute(s) of face-to-face time spent with Mr. Sara, was spent in counseling/coordination of care regarding: Mr. Mucci's primary cause of pain, the treatment plan, the appropriate use of his medications, realistic expectations, the goals of pain management (increased in functionality) and the patient's responsibilities when it comes to controlled substances.  Provider-requested follow-up: Return in about 3 months (around 04/16/2018) for Medication Management.  Future Appointments  Date Time Provider [place]  02/04/2018  9:30 AM [facility]  03/19/2018 10:00 AM Leone Haven, MD [facility]  04/15/2018  8:30 AM [name], MD [facility] None  11/17/2018  9:00 AM O'Brien-Blaney, Bryson Corona, LPN [facility]  05/23/7251  9:30 AM Leone Haven, MD [facility]    Primary Care Physician: Leone Haven, MD Location: [facility] Facility Note by: [name], M.D Date: 01/14/2018; Time: 9:06 AM  Patient Instructions  Recommend Aquaphor cream to IPG  incision site twice a day after shower.  Medication refill for 3 months as below. You have been e scribed 3 oxycodone and 1 Flexeril script

## 2018-01-14 NOTE — Progress Notes (Signed)
Nursing Pain Medication Assessment:  Safety precautions to be maintained throughout the outpatient stay will include: orient to surroundings, keep bed in low position, maintain call bell within reach at all times, provide assistance with transfer out of bed and ambulation.  Medication Inspection Compliance: Pill count conducted under aseptic conditions, in front of the patient. Neither the pills nor the bottle was removed from the patient's sight at any time. Once count was completed pills were immediately returned to the patient in their original bottle.  Medication: Oxycodone/APAP Pill/Patch Count: 30 of 100 pills remain Pill/Patch Appearance: Markings consistent with prescribed medication Bottle Appearance: Standard pharmacy container. Clearly labeled. Filled Date: 08 / 02 / 2019 Last Medication intake:  Today

## 2018-01-15 ENCOUNTER — Other Ambulatory Visit: Payer: Self-pay | Admitting: Orthopedic Surgery

## 2018-01-15 DIAGNOSIS — M25512 Pain in left shoulder: Secondary | ICD-10-CM

## 2018-01-15 DIAGNOSIS — M19012 Primary osteoarthritis, left shoulder: Secondary | ICD-10-CM | POA: Diagnosis not present

## 2018-01-22 ENCOUNTER — Other Ambulatory Visit: Payer: Self-pay | Admitting: Family Medicine

## 2018-01-22 DIAGNOSIS — R413 Other amnesia: Secondary | ICD-10-CM | POA: Diagnosis not present

## 2018-01-22 DIAGNOSIS — R262 Difficulty in walking, not elsewhere classified: Secondary | ICD-10-CM | POA: Diagnosis not present

## 2018-01-22 DIAGNOSIS — R251 Tremor, unspecified: Secondary | ICD-10-CM | POA: Diagnosis not present

## 2018-01-22 DIAGNOSIS — G479 Sleep disorder, unspecified: Secondary | ICD-10-CM | POA: Diagnosis not present

## 2018-01-24 ENCOUNTER — Other Ambulatory Visit: Payer: Self-pay | Admitting: Family Medicine

## 2018-01-31 ENCOUNTER — Inpatient Hospital Stay
Admission: RE | Admit: 2018-01-31 | Discharge: 2018-01-31 | Disposition: A | Discharge status: Home / Self Care | Payer: Self-pay | Source: Ambulatory Visit | Attending: Orthopedic Surgery | Admitting: Orthopedic Surgery

## 2018-01-31 ENCOUNTER — Inpatient Hospital Stay: Admission: RE | Admit: 2018-01-31 | Payer: Self-pay | Source: Ambulatory Visit

## 2018-02-04 ENCOUNTER — Ambulatory Visit
Admission: RE | Admit: 2018-02-04 | Discharge: 2018-02-04 | Disposition: A | Discharge status: Home / Self Care | Payer: PPO | Source: Ambulatory Visit | Attending: Family Medicine | Admitting: Family Medicine

## 2018-02-04 DIAGNOSIS — E041 Nontoxic single thyroid nodule: Secondary | ICD-10-CM | POA: Diagnosis not present

## 2018-02-06 ENCOUNTER — Other Ambulatory Visit: Payer: Self-pay | Admitting: Family Medicine

## 2018-02-06 ENCOUNTER — Telehealth: Payer: Self-pay

## 2018-02-06 DIAGNOSIS — E041 Nontoxic single thyroid nodule: Secondary | ICD-10-CM

## 2018-02-06 NOTE — Telephone Encounter (Signed)
Copied from [place] [phone]. Topic: General - Other >> Feb 06, 2018  2:31 PM Waldemar Dickens, Valda Favia wrote: Pt states he received a call to schedule lab work. No CRM created to follow up on  Called and left voicemail for patient to call office back to schedule lab appointment.

## 2018-02-07 ENCOUNTER — Other Ambulatory Visit: Payer: Self-pay

## 2018-02-07 ENCOUNTER — Other Ambulatory Visit (INDEPENDENT_AMBULATORY_CARE_PROVIDER_SITE_OTHER): Payer: PPO

## 2018-02-07 DIAGNOSIS — E041 Nontoxic single thyroid nodule: Secondary | ICD-10-CM | POA: Diagnosis not present

## 2018-02-07 LAB — TSH: TSH: 2.75 u[IU]/mL (ref 0.35–4.50)

## 2018-02-13 DIAGNOSIS — M79672 Pain in left foot: Secondary | ICD-10-CM | POA: Diagnosis not present

## 2018-02-13 DIAGNOSIS — M6281 Muscle weakness (generalized): Secondary | ICD-10-CM | POA: Diagnosis not present

## 2018-02-13 DIAGNOSIS — M7672 Peroneal tendinitis, left leg: Secondary | ICD-10-CM | POA: Diagnosis not present

## 2018-02-13 DIAGNOSIS — R262 Difficulty in walking, not elsewhere classified: Secondary | ICD-10-CM | POA: Diagnosis not present

## 2018-02-14 ENCOUNTER — Encounter: Payer: Self-pay | Admitting: *Deleted

## 2018-02-14 ENCOUNTER — Other Ambulatory Visit: Payer: PPO

## 2018-02-14 ENCOUNTER — Telehealth: Payer: Self-pay | Admitting: *Deleted

## 2018-02-14 NOTE — Telephone Encounter (Signed)
PEC scheduled pt for a lab appt yesterday after 4pm. Pt came in this morning with no lab orders. He states it was for a thyroid check (TSH was normal on 02/07/18). Please place future lab orders.

## 2018-02-14 NOTE — Telephone Encounter (Signed)
It does not look like he actually needed labs today.  He recently had a TSH which was normal.

## 2018-02-17 NOTE — Telephone Encounter (Signed)
I sent pt a mychart message to notify him.

## 2018-02-20 ENCOUNTER — Ambulatory Visit
Admission: RE | Admit: 2018-02-20 | Discharge: 2018-02-20 | Disposition: A | Discharge status: Home / Self Care | Payer: PPO | Source: Ambulatory Visit | Attending: Orthopedic Surgery | Admitting: Orthopedic Surgery

## 2018-02-20 DIAGNOSIS — M25512 Pain in left shoulder: Secondary | ICD-10-CM

## 2018-02-20 DIAGNOSIS — M19011 Primary osteoarthritis, right shoulder: Secondary | ICD-10-CM | POA: Diagnosis not present

## 2018-02-20 DIAGNOSIS — M25511 Pain in right shoulder: Secondary | ICD-10-CM | POA: Diagnosis not present

## 2018-02-20 MED ORDER — IOPAMIDOL (ISOVUE-M 200) INJECTION 41%
18.0000 mL | Freq: Once | INTRAMUSCULAR | Status: AC
  Administered 2018-02-20: 18 mL via INTRA_ARTICULAR

## 2018-02-21 ENCOUNTER — Other Ambulatory Visit: Payer: Self-pay | Admitting: Otolaryngology

## 2018-02-21 DIAGNOSIS — E041 Nontoxic single thyroid nodule: Secondary | ICD-10-CM

## 2018-02-23 ENCOUNTER — Other Ambulatory Visit: Payer: Self-pay | Admitting: Family Medicine

## 2018-03-04 ENCOUNTER — Ambulatory Visit
Admission: RE | Admit: 2018-03-04 | Discharge: 2018-03-04 | Disposition: A | Discharge status: Home / Self Care | Payer: PPO | Source: Ambulatory Visit | Attending: Otolaryngology | Admitting: Otolaryngology

## 2018-03-04 DIAGNOSIS — E041 Nontoxic single thyroid nodule: Secondary | ICD-10-CM | POA: Diagnosis not present

## 2018-03-04 NOTE — Procedures (Signed)
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   EBL: None  No immediate complications.   Ronny Bacon, MD Pager #: [phone]

## 2018-03-05 LAB — CYTOLOGY - NON PAP

## 2018-03-11 ENCOUNTER — Encounter: Payer: Self-pay | Admitting: Family Medicine

## 2018-03-19 ENCOUNTER — Encounter: Payer: Self-pay | Admitting: Family Medicine

## 2018-03-19 ENCOUNTER — Ambulatory Visit (INDEPENDENT_AMBULATORY_CARE_PROVIDER_SITE_OTHER): Payer: PPO | Admitting: Family Medicine

## 2018-03-19 ENCOUNTER — Other Ambulatory Visit: Payer: Self-pay | Admitting: Family Medicine

## 2018-03-19 VITALS — BP 98/64 | HR 84 | Temp 98.0°F | Ht 70.0 in | Wt 182.6 lb

## 2018-03-19 DIAGNOSIS — J069 Acute upper respiratory infection, unspecified: Secondary | ICD-10-CM | POA: Diagnosis not present

## 2018-03-19 DIAGNOSIS — K529 Noninfective gastroenteritis and colitis, unspecified: Secondary | ICD-10-CM | POA: Diagnosis not present

## 2018-03-19 DIAGNOSIS — E1142 Type 2 diabetes mellitus with diabetic polyneuropathy: Secondary | ICD-10-CM | POA: Diagnosis not present

## 2018-03-19 DIAGNOSIS — Z9889 Other specified postprocedural states: Secondary | ICD-10-CM | POA: Diagnosis not present

## 2018-03-19 DIAGNOSIS — Z9689 Presence of other specified functional implants: Secondary | ICD-10-CM

## 2018-03-19 DIAGNOSIS — Z23 Encounter for immunization: Secondary | ICD-10-CM | POA: Diagnosis not present

## 2018-03-19 DIAGNOSIS — D649 Anemia, unspecified: Secondary | ICD-10-CM

## 2018-03-19 DIAGNOSIS — I10 Essential (primary) hypertension: Secondary | ICD-10-CM | POA: Diagnosis not present

## 2018-03-19 DIAGNOSIS — E785 Hyperlipidemia, unspecified: Secondary | ICD-10-CM

## 2018-03-19 LAB — BASIC METABOLIC PANEL
BUN: 34 mg/dL — ABNORMAL HIGH (ref 6–23)
CO2: 29 mEq/L (ref 19–32)
Calcium: 9.7 mg/dL (ref 8.4–10.5)
Chloride: 99 mEq/L (ref 96–112)
Creatinine, Ser: 1.32 mg/dL (ref 0.40–1.50)
GFR: 55.73 mL/min — ABNORMAL LOW (ref >60.00)
Glucose, Bld: 143 mg/dL — ABNORMAL HIGH (ref 70–99)
Potassium: 4.8 mEq/L (ref 3.5–5.1)
Sodium: 136 mEq/L (ref 135–145)

## 2018-03-19 LAB — CBC
HCT: 32.7 % — ABNORMAL LOW (ref 39.0–52.0)
Hemoglobin: 11.1 g/dL — ABNORMAL LOW (ref 13.0–17.0)
MCHC: 33.9 g/dL (ref 30.0–36.0)
MCV: 93.5 fl (ref 78.0–100.0)
Platelets: 383 10*3/uL (ref 150.0–400.0)
RBC: 3.5 Mil/uL — ABNORMAL LOW (ref 4.22–5.81)
RDW: 13.1 % (ref 11.5–15.5)
WBC: 6.4 10*3/uL (ref 4.0–10.5)

## 2018-03-19 LAB — LDL CHOLESTEROL, DIRECT: Direct LDL: 64 mg/dL

## 2018-03-19 LAB — FOLATE: Folate: 13.3 ng/mL (ref >5.9)

## 2018-03-19 LAB — HEMOGLOBIN A1C: Hgb A1c MFr Bld: 7.5 % — ABNORMAL HIGH (ref 4.6–6.5)

## 2018-03-19 LAB — VITAMIN B12: Vitamin B-12: 433 pg/mL (ref 211–911)

## 2018-03-19 NOTE — Assessment & Plan Note (Signed)
No obvious abnormalities other than a small scab over the prior scar.  We will refer back to the neurosurgeon who placed the device for reevaluation.  Given return precautions.

## 2018-03-19 NOTE — Assessment & Plan Note (Signed)
Prior upper respiratory infection that has been improving.  He will continue to monitor.  If his cough does not continue to improve he will let us know.

## 2018-03-19 NOTE — Progress Notes (Signed)
Tommi Rumps, MD Phone: [phone]  James Hood is a 77 y.o. male who presents today for f/u.  CC: htn, DM, depression/anxiety  HYPERTENSION  Disease Monitoring  Home BP Monitoring not checking Chest pain- no    Dyspnea- no Medications  Compliance-  Lisinopril, HCTZ, metoprolol.   DIABETES Disease Monitoring: Blood Sugar ranges-not checking Polyuria/phagia/dipsia- no      Optho- UTD Medications: Compliance- januvia, metformin Hypoglycemic symptoms- no  Cough: Notes he had a cold a few weeks ago.  Notes it has been getting better.  He had nasal congestion and postnasal drip with mucus out of his nose.  Diarrhea: Notes he broke out into a sweat with not feeling well yesterday.  Subsequently developed diarrhea and then had another episode of urgency where he had a bowel movement in his pants.  Notes he felt better after having the bowel movement.  He has not had any recurrent sweating.  No chest pain or dyspnea with this.  No blood in stool.  His stool was brown.  No nausea vomiting or abdominal pain.  No bowel movement today.  Spinal cord stimulator: He has a spinal cord stimulator in place.  He notes it is occasionally sore.  He wonders if the incision is not completely healed.  He has noted no fevers.  It has helped significantly with his pain.  He has not seen his neurosurgeon recently.  He does take oxycodone for chronic pain.  Anemia: Has had chronic anemia that is normocytic.  He notes his wife saw this and told him to take iron.  His levels have been relatively stable.   Social History   Tobacco Use  Smoking Status Current Every Day Smoker  . Packs/day: 0.50  . Years: 60.00  . Pack years: 30.00  . Types: Cigarettes  . Last attempt to quit: 08/2016  . Years since quitting: 1.5  Smokeless Tobacco Never Used     ROS see history of present illness  Objective  Physical Exam Vitals:   03/19/18 1006  BP: 98/64  Pulse: 84  Temp: 98 F (36.7 C)  SpO2: 93%     BP Readings from Last 3 Encounters:  03/19/18 98/64  03/04/18 116/68  01/14/18 119/76   Wt Readings from Last 3 Encounters:  03/19/18 182 lb 9.6 oz (82.8 kg)  01/14/18 180 lb (81.6 kg)  11/15/17 183 lb 12.8 oz (83.4 kg)    Physical Exam  Constitutional: No distress.  HENT:  Head: Normocephalic and atraumatic.  Mouth/Throat: Oropharynx is clear and moist.  Eyes: Pupils are equal, round, and reactive to light. Conjunctivae are normal.  Neck: Neck supple.  Cardiovascular: Normal rate, regular rhythm and normal heart sounds.  Pulmonary/Chest: Effort normal.  Initial coarseness on expiration though with coughing this resolved, clear lung sounds after coughing  Abdominal: Soft. Bowel sounds are normal. He exhibits no distension. There is no tenderness. There is no rebound and no guarding.  Musculoskeletal: He exhibits no edema.       Legs: Lymphadenopathy:    He has no cervical adenopathy.  Neurological: He is alert.  Skin: Skin is warm and dry. He is not diaphoretic.     Assessment/Plan: Please see individual problem list.  Hypertension Well-controlled.  Continue current regimen.  Lab work as outlined below.  Type 2 diabetes mellitus (HCC) Check A1c.  Continue current regimen.  Encouraged him to decrease soda intake.  Anemia Chronic normocytic anemia.  This has been relatively stable over the years.  He has been  taking iron supplementation.  We will recheck this.  We will check iron levels as well as B12 and folate.  S/P insertion of spinal cord stimulator No obvious abnormalities other than a small scab over the prior scar.  We will refer back to the neurosurgeon who placed the device for reevaluation.  Given return precautions.  Chronic diarrhea Recent episode of diarrhea.  Has resolved at this point.  Benign abdominal exam.  He will monitor for recurrence.  Upper respiratory infection Prior upper respiratory infection that has been improving.  He will continue to  monitor.  If his cough does not continue to improve he will let us know.   Orders Placed This Encounter  Procedures  . Flu vaccine HIGH DOSE PF (Fluzone High dose)  . HgB A1c  . Basic Metabolic Panel (BMET)  . Direct LDL  . CBC  . Iron, TIBC and Ferritin Panel  . B12  . Folate  . Ambulatory referral to Neurosurgery    Referral Priority:   Routine    Referral Type:   Surgical    Referral Reason:   Specialty Services Required    Requested Specialty:   Neurosurgery    Number of Visits Requested:   1    No orders of the defined types were placed in this encounter.    Tommi Rumps, MD [place]

## 2018-03-19 NOTE — Assessment & Plan Note (Signed)
Recent episode of diarrhea.  Has resolved at this point.  Benign abdominal exam.  He will monitor for recurrence.

## 2018-03-19 NOTE — Assessment & Plan Note (Signed)
Well-controlled.  Continue current regimen.  Lab work as outlined below.

## 2018-03-19 NOTE — Patient Instructions (Signed)
Nice to see you. We will check lab work today. We will get you to see your neurosurgeon.  If your spinal cord stimulator area becomes more painful or there is swelling or erythema please seek medical attention. Please monitor your cough and if it does not continue to improve please let us know. Please monitor your stools and if you have recurrent issues with them please let us know.

## 2018-03-19 NOTE — Assessment & Plan Note (Signed)
Check A1c.  Continue current regimen.  Encouraged him to decrease soda intake.

## 2018-03-19 NOTE — Assessment & Plan Note (Signed)
Chronic normocytic anemia.  This has been relatively stable over the years.  He has been taking iron supplementation.  We will recheck this.  We will check iron levels as well as B12 and folate.

## 2018-03-20 LAB — IRON,TIBC AND FERRITIN PANEL
%SAT: 36 % (calc) (ref 20–48)
Ferritin: 95 ng/mL (ref 24–380)
Iron: 135 ug/dL (ref 50–180)
TIBC: 370 mcg/dL (calc) (ref 250–425)

## 2018-03-24 ENCOUNTER — Other Ambulatory Visit: Payer: Self-pay | Admitting: Internal Medicine

## 2018-03-27 DIAGNOSIS — T859XXA Unspecified complication of internal prosthetic device, implant and graft, initial encounter: Secondary | ICD-10-CM | POA: Diagnosis not present

## 2018-03-31 ENCOUNTER — Other Ambulatory Visit: Payer: Self-pay

## 2018-03-31 ENCOUNTER — Encounter
Admission: RE | Admit: 2018-03-31 | Discharge: 2018-03-31 | Disposition: A | Discharge status: Home / Self Care | Payer: PPO | Source: Ambulatory Visit | Attending: Neurosurgery | Admitting: Neurosurgery

## 2018-03-31 NOTE — Pre-Procedure Instructions (Signed)
Patient had CBC BMP drawn on 03/19/18.

## 2018-03-31 NOTE — Patient Instructions (Signed)
Your procedure is scheduled on: Wed 04/02/18 Report to [place]. To find out your arrival time please call [phone] between 1PM - 3PM on Tue 04/01/18 .  Remember: Instructions that are not followed completely may result in serious medical risk, up to and including death, or upon the discretion of your surgeon and anesthesiologist your surgery may need to be rescheduled.     _X__ 1. Do not eat food after midnight the night before your procedure.                 No gum chewing or hard candies. You may drink clear liquids up to 2 hours                 before you are scheduled to arrive for your surgery- DO not drink clear                 liquids within 2 hours of the start of your surgery.                 Clear Liquids include:  water, apple juice without pulp, clear carbohydrate                 drink such as Clearfast or Gatorade, Black Coffee or Tea (Do not add                 anything to coffee or tea).  __X__2.  On the morning of surgery brush your teeth with toothpaste and water, you                 may rinse your mouth with mouthwash if you wish.  Do not swallow any              toothpaste of mouthwash.     _X__ 3.  No Alcohol for 24 hours before or after surgery.   _X__ 4.  Do Not Smoke or use e-cigarettes For 24 Hours Prior to Your Surgery.                 Do not use any chewable tobacco products for at least 6 hours prior to                 surgery.  ____  5.  Bring all medications with you on the day of surgery if instructed.   __X__  6.  Notify your doctor if there is any change in your medical condition      (cold, fever, infections).     Do not wear jewelry, make-up, hairpins, clips or nail polish. Do not wear lotions, powders, or perfumes.  Do not shave 48 hours prior to surgery. Men may shave face and neck. Do not bring valuables to the hospital.    [facility] is not responsible for any belongings or  valuables.  Contacts, dentures/partials or body piercings may not be worn into surgery. Bring a case for your contacts, glasses or hearing aids, a denture cup will be supplied. Leave your suitcase in the car. After surgery it may be brought to your room. For patients admitted to the hospital, discharge time is determined by your treatment team.   Patients discharged the day of surgery will not be allowed to drive home.   Please read over the following fact sheets that you were given:   MRSA Information  __X__ Take these medicines the morning of surgery with A SIP OF WATER:  1. buPROPion (WELLBUTRIN   2. divalproex (DEPAKOTE ER)  3. gabapentin (NEURONTIN)   4. memantine (NAMENDA)   5. omeprazole (PRILOSEC)  6. primidone (MYSOLINE  ____ Fleet Enema (as directed)   __X__ Use CHG Soap/SAGE wipes as directed  ____ Use inhalers on the day of surgery  __X__ Stop metformin/Janumet/Farxiga 2 days prior to surgery    ____ Take 1/2 of usual insulin dose the night before surgery. No insulin the morning          of surgery.   ____ Stop Blood Thinners Coumadin/Plavix/Xarelto/Pleta/Pradaxa/Eliquis/Effient/Aspirin  on   Or contact your Surgeon, Cardiologist or Medical Doctor regarding  ability to stop your blood thinners  __X__ Stop Anti-inflammatories 7 days before surgery such as Advil, Ibuprofen, Motrin,  BC or Goodies Powder, Naprosyn, Naproxen, Aleve, Aspirin    __X__ Stop all herbal supplements, fish oil or vitamin E until after surgery.    ____ Bring C-Pap to the hospital.

## 2018-04-01 ENCOUNTER — Encounter
Admission: RE | Admit: 2018-04-01 | Discharge: 2018-04-01 | Disposition: A | Discharge status: Home / Self Care | Payer: PPO | Source: Ambulatory Visit | Attending: Neurosurgery | Admitting: Neurosurgery

## 2018-04-01 DIAGNOSIS — T85738A Infection and inflammatory reaction due to other nervous system device, implant or graft, initial encounter: Secondary | ICD-10-CM | POA: Diagnosis not present

## 2018-04-01 DIAGNOSIS — K219 Gastro-esophageal reflux disease without esophagitis: Secondary | ICD-10-CM | POA: Diagnosis not present

## 2018-04-01 DIAGNOSIS — I10 Essential (primary) hypertension: Secondary | ICD-10-CM

## 2018-04-01 DIAGNOSIS — Z8249 Family history of ischemic heart disease and other diseases of the circulatory system: Secondary | ICD-10-CM | POA: Diagnosis not present

## 2018-04-01 DIAGNOSIS — E0590 Thyrotoxicosis, unspecified without thyrotoxic crisis or storm: Secondary | ICD-10-CM | POA: Diagnosis not present

## 2018-04-01 DIAGNOSIS — B964 Proteus (mirabilis) (morganii) as the cause of diseases classified elsewhere: Secondary | ICD-10-CM | POA: Diagnosis not present

## 2018-04-01 DIAGNOSIS — G8929 Other chronic pain: Secondary | ICD-10-CM | POA: Diagnosis not present

## 2018-04-01 DIAGNOSIS — F329 Major depressive disorder, single episode, unspecified: Secondary | ICD-10-CM | POA: Diagnosis not present

## 2018-04-01 DIAGNOSIS — E7800 Pure hypercholesterolemia, unspecified: Secondary | ICD-10-CM | POA: Diagnosis not present

## 2018-04-01 DIAGNOSIS — Z01818 Encounter for other preprocedural examination: Secondary | ICD-10-CM

## 2018-04-01 DIAGNOSIS — Z4542 Encounter for adjustment and management of neuropacemaker (brain) (peripheral nerve) (spinal cord): Secondary | ICD-10-CM | POA: Diagnosis not present

## 2018-04-01 DIAGNOSIS — E785 Hyperlipidemia, unspecified: Secondary | ICD-10-CM | POA: Insufficient documentation

## 2018-04-01 DIAGNOSIS — E119 Type 2 diabetes mellitus without complications: Secondary | ICD-10-CM | POA: Insufficient documentation

## 2018-04-01 DIAGNOSIS — M5442 Lumbago with sciatica, left side: Secondary | ICD-10-CM | POA: Diagnosis not present

## 2018-04-01 DIAGNOSIS — M1990 Unspecified osteoarthritis, unspecified site: Secondary | ICD-10-CM | POA: Diagnosis not present

## 2018-04-01 DIAGNOSIS — Y753 Surgical instruments, materials and neurological devices (including sutures) associated with adverse incidents: Secondary | ICD-10-CM | POA: Diagnosis not present

## 2018-04-01 DIAGNOSIS — M961 Postlaminectomy syndrome, not elsewhere classified: Secondary | ICD-10-CM | POA: Diagnosis not present

## 2018-04-01 DIAGNOSIS — F0390 Unspecified dementia without behavioral disturbance: Secondary | ICD-10-CM | POA: Diagnosis not present

## 2018-04-01 DIAGNOSIS — Z7984 Long term (current) use of oral hypoglycemic drugs: Secondary | ICD-10-CM | POA: Diagnosis not present

## 2018-04-01 DIAGNOSIS — F17210 Nicotine dependence, cigarettes, uncomplicated: Secondary | ICD-10-CM | POA: Diagnosis not present

## 2018-04-01 DIAGNOSIS — Z8719 Personal history of other diseases of the digestive system: Secondary | ICD-10-CM | POA: Diagnosis not present

## 2018-04-01 DIAGNOSIS — Z79899 Other long term (current) drug therapy: Secondary | ICD-10-CM | POA: Diagnosis not present

## 2018-04-01 DIAGNOSIS — N400 Enlarged prostate without lower urinary tract symptoms: Secondary | ICD-10-CM | POA: Diagnosis not present

## 2018-04-01 DIAGNOSIS — G250 Essential tremor: Secondary | ICD-10-CM | POA: Diagnosis not present

## 2018-04-01 DIAGNOSIS — K449 Diaphragmatic hernia without obstruction or gangrene: Secondary | ICD-10-CM | POA: Diagnosis not present

## 2018-04-01 DIAGNOSIS — Z1620 Resistance to unspecified antibiotic: Secondary | ICD-10-CM | POA: Diagnosis not present

## 2018-04-01 DIAGNOSIS — F419 Anxiety disorder, unspecified: Secondary | ICD-10-CM | POA: Diagnosis not present

## 2018-04-01 LAB — URINALYSIS, ROUTINE W REFLEX MICROSCOPIC
Bilirubin Urine: NEGATIVE
Glucose, UA: NEGATIVE mg/dL
Hgb urine dipstick: NEGATIVE
Ketones, ur: NEGATIVE mg/dL
Leukocytes, UA: NEGATIVE
Nitrite: NEGATIVE
Protein, ur: NEGATIVE mg/dL
Specific Gravity, Urine: 1.017 (ref 1.005–1.030)
pH: 5 (ref 5.0–8.0)

## 2018-04-01 LAB — APTT: aPTT: 35 seconds (ref 24–36)

## 2018-04-01 LAB — PROTIME-INR
INR: 0.91
Prothrombin Time: 12.2 seconds (ref 11.4–15.2)

## 2018-04-01 NOTE — Pre-Procedure Instructions (Signed)
EKG OK BY DR Amie Critchley

## 2018-04-02 ENCOUNTER — Ambulatory Visit: Payer: PPO | Admitting: Certified Registered"

## 2018-04-02 ENCOUNTER — Ambulatory Visit: Payer: PPO

## 2018-04-02 ENCOUNTER — Other Ambulatory Visit: Payer: Self-pay

## 2018-04-02 ENCOUNTER — Encounter: Payer: Self-pay | Admitting: Anesthesiology

## 2018-04-02 ENCOUNTER — Observation Stay
Admission: RE | Admit: 2018-04-02 | Discharge: 2018-04-04 | Disposition: A | Discharge status: Home Health | Payer: PPO | Source: Ambulatory Visit | Attending: Neurosurgery | Admitting: Neurosurgery

## 2018-04-02 ENCOUNTER — Encounter
Admission: RE | Disposition: A | Discharge status: Home Health | Payer: Self-pay | Source: Ambulatory Visit | Attending: Neurosurgery

## 2018-04-02 DIAGNOSIS — B964 Proteus (mirabilis) (morganii) as the cause of diseases classified elsewhere: Secondary | ICD-10-CM | POA: Insufficient documentation

## 2018-04-02 DIAGNOSIS — Z4542 Encounter for adjustment and management of neuropacemaker (brain) (peripheral nerve) (spinal cord): Secondary | ICD-10-CM | POA: Insufficient documentation

## 2018-04-02 DIAGNOSIS — I10 Essential (primary) hypertension: Secondary | ICD-10-CM | POA: Insufficient documentation

## 2018-04-02 DIAGNOSIS — Z79899 Other long term (current) drug therapy: Secondary | ICD-10-CM | POA: Insufficient documentation

## 2018-04-02 DIAGNOSIS — M1990 Unspecified osteoarthritis, unspecified site: Secondary | ICD-10-CM | POA: Insufficient documentation

## 2018-04-02 DIAGNOSIS — F0390 Unspecified dementia without behavioral disturbance: Secondary | ICD-10-CM | POA: Insufficient documentation

## 2018-04-02 DIAGNOSIS — Z1620 Resistance to unspecified antibiotic: Secondary | ICD-10-CM | POA: Insufficient documentation

## 2018-04-02 DIAGNOSIS — T85738A Infection and inflammatory reaction due to other nervous system device, implant or graft, initial encounter: Principal | ICD-10-CM | POA: Insufficient documentation

## 2018-04-02 DIAGNOSIS — E119 Type 2 diabetes mellitus without complications: Secondary | ICD-10-CM | POA: Insufficient documentation

## 2018-04-02 DIAGNOSIS — T85193A Other mechanical complication of implanted electronic neurostimulator, generator, initial encounter: Secondary | ICD-10-CM | POA: Diagnosis not present

## 2018-04-02 DIAGNOSIS — M961 Postlaminectomy syndrome, not elsewhere classified: Secondary | ICD-10-CM | POA: Insufficient documentation

## 2018-04-02 DIAGNOSIS — F419 Anxiety disorder, unspecified: Secondary | ICD-10-CM | POA: Insufficient documentation

## 2018-04-02 DIAGNOSIS — M5442 Lumbago with sciatica, left side: Secondary | ICD-10-CM | POA: Insufficient documentation

## 2018-04-02 DIAGNOSIS — G8929 Other chronic pain: Secondary | ICD-10-CM | POA: Insufficient documentation

## 2018-04-02 DIAGNOSIS — F329 Major depressive disorder, single episode, unspecified: Secondary | ICD-10-CM | POA: Insufficient documentation

## 2018-04-02 DIAGNOSIS — E0590 Thyrotoxicosis, unspecified without thyrotoxic crisis or storm: Secondary | ICD-10-CM | POA: Insufficient documentation

## 2018-04-02 DIAGNOSIS — Y753 Surgical instruments, materials and neurological devices (including sutures) associated with adverse incidents: Secondary | ICD-10-CM | POA: Insufficient documentation

## 2018-04-02 DIAGNOSIS — N400 Enlarged prostate without lower urinary tract symptoms: Secondary | ICD-10-CM | POA: Insufficient documentation

## 2018-04-02 DIAGNOSIS — T8140XA Infection following a procedure, unspecified, initial encounter: Secondary | ICD-10-CM | POA: Diagnosis not present

## 2018-04-02 DIAGNOSIS — E785 Hyperlipidemia, unspecified: Secondary | ICD-10-CM | POA: Diagnosis not present

## 2018-04-02 DIAGNOSIS — Z419 Encounter for procedure for purposes other than remedying health state, unspecified: Secondary | ICD-10-CM

## 2018-04-02 DIAGNOSIS — Z8719 Personal history of other diseases of the digestive system: Secondary | ICD-10-CM | POA: Insufficient documentation

## 2018-04-02 DIAGNOSIS — T847XXA Infection and inflammatory reaction due to other internal orthopedic prosthetic devices, implants and grafts, initial encounter: Secondary | ICD-10-CM | POA: Diagnosis present

## 2018-04-02 DIAGNOSIS — F418 Other specified anxiety disorders: Secondary | ICD-10-CM | POA: Diagnosis not present

## 2018-04-02 DIAGNOSIS — Z8249 Family history of ischemic heart disease and other diseases of the circulatory system: Secondary | ICD-10-CM | POA: Insufficient documentation

## 2018-04-02 DIAGNOSIS — G250 Essential tremor: Secondary | ICD-10-CM | POA: Insufficient documentation

## 2018-04-02 DIAGNOSIS — K449 Diaphragmatic hernia without obstruction or gangrene: Secondary | ICD-10-CM | POA: Insufficient documentation

## 2018-04-02 DIAGNOSIS — Z7984 Long term (current) use of oral hypoglycemic drugs: Secondary | ICD-10-CM | POA: Insufficient documentation

## 2018-04-02 DIAGNOSIS — K219 Gastro-esophageal reflux disease without esophagitis: Secondary | ICD-10-CM | POA: Insufficient documentation

## 2018-04-02 DIAGNOSIS — F17210 Nicotine dependence, cigarettes, uncomplicated: Secondary | ICD-10-CM | POA: Insufficient documentation

## 2018-04-02 DIAGNOSIS — E7800 Pure hypercholesterolemia, unspecified: Secondary | ICD-10-CM | POA: Insufficient documentation

## 2018-04-02 HISTORY — PX: PULSE GENERATOR IMPLANT: SHX5370

## 2018-04-02 HISTORY — DX: Infection and inflammatory reaction due to other internal orthopedic prosthetic devices, implants and grafts, initial encounter: T84.7XXA

## 2018-04-02 LAB — GLUCOSE, CAPILLARY
Glucose-Capillary: 145 mg/dL — ABNORMAL HIGH (ref 70–99)
Glucose-Capillary: 194 mg/dL — ABNORMAL HIGH (ref 70–99)
Glucose-Capillary: 150 mg/dL — ABNORMAL HIGH (ref 70–99)
Glucose-Capillary: 145 mg/dL — ABNORMAL HIGH (ref 70–99)

## 2018-04-02 SURGERY — UNILATERAL PULSE GENERATOR IMPLANT
Anesthesia: General | Site: Back | Laterality: Right

## 2018-04-02 MED ORDER — NEOSTIGMINE METHYLSULFATE 10 MG/10ML IV SOLN
INTRAVENOUS | Status: DC | PRN
  Administered 2018-04-02: 3 mg via INTRAVENOUS

## 2018-04-02 MED ORDER — METFORMIN HCL 500 MG PO TABS
1000.0000 mg | ORAL_TABLET | Freq: Two times a day (BID) | ORAL | Status: DC
  Administered 2018-04-02 – 2018-04-04 (×4): 1000 mg via ORAL
  Filled 2018-04-02 (×5): qty 2

## 2018-04-02 MED ORDER — METHOCARBAMOL 1000 MG/10ML IJ SOLN
500.0000 mg | Freq: Four times a day (QID) | INTRAVENOUS | Status: DC | PRN
  Filled 2018-04-02: qty 5

## 2018-04-02 MED ORDER — FENTANYL CITRATE (PF) 100 MCG/2ML IJ SOLN
INTRAMUSCULAR | Status: DC | PRN
  Administered 2018-04-02: 25 ug via INTRAVENOUS
  Administered 2018-04-02 (×2): 50 ug via INTRAVENOUS
  Administered 2018-04-02: 25 ug via INTRAVENOUS

## 2018-04-02 MED ORDER — FENOFIBRATE 160 MG PO TABS
160.0000 mg | ORAL_TABLET | Freq: Every day | ORAL | Status: DC
  Administered 2018-04-03 – 2018-04-04 (×2): 160 mg via ORAL
  Filled 2018-04-02 (×3): qty 1

## 2018-04-02 MED ORDER — TOBRAMYCIN SULFATE 1.2 G IJ SOLR
INTRAMUSCULAR | Status: DC | PRN
  Administered 2018-04-02: 1.2 g

## 2018-04-02 MED ORDER — ACETAMINOPHEN 325 MG PO TABS: 650.0000 mg | ORAL_TABLET | ORAL | Status: DC | PRN

## 2018-04-02 MED ORDER — NEOSTIGMINE METHYLSULFATE 10 MG/10ML IV SOLN
INTRAVENOUS | Status: AC
  Filled 2018-04-02: qty 1

## 2018-04-02 MED ORDER — ONDANSETRON HCL 4 MG/2ML IJ SOLN
INTRAMUSCULAR | Status: AC
  Filled 2018-04-02: qty 2

## 2018-04-02 MED ORDER — GLYCOPYRROLATE 0.2 MG/ML IJ SOLN
INTRAMUSCULAR | Status: AC
  Filled 2018-04-02: qty 2

## 2018-04-02 MED ORDER — SODIUM CHLORIDE FLUSH 0.9 % IV SOLN
INTRAVENOUS | Status: AC
  Filled 2018-04-02: qty 10

## 2018-04-02 MED ORDER — OXYCODONE HCL 5 MG PO TABS
10.0000 mg | ORAL_TABLET | ORAL | Status: DC | PRN
  Administered 2018-04-02 – 2018-04-04 (×6): 10 mg via ORAL
  Filled 2018-04-02 (×5): qty 2

## 2018-04-02 MED ORDER — BUPIVACAINE LIPOSOME 1.3 % IJ SUSP
INTRAMUSCULAR | Status: AC
  Filled 2018-04-02: qty 20

## 2018-04-02 MED ORDER — SUCCINYLCHOLINE CHLORIDE 20 MG/ML IJ SOLN
INTRAMUSCULAR | Status: DC | PRN
  Administered 2018-04-02: 100 mg via INTRAVENOUS

## 2018-04-02 MED ORDER — LISINOPRIL-HYDROCHLOROTHIAZIDE 20-25 MG PO TABS: 1.0000 | ORAL_TABLET | Freq: Two times a day (BID) | ORAL | Status: DC

## 2018-04-02 MED ORDER — FENTANYL CITRATE (PF) 100 MCG/2ML IJ SOLN
INTRAMUSCULAR | Status: AC
  Administered 2018-04-02: 25 ug via INTRAVENOUS
  Filled 2018-04-02: qty 2

## 2018-04-02 MED ORDER — METOPROLOL SUCCINATE ER 25 MG PO TB24
25.0000 mg | ORAL_TABLET | Freq: Every day | ORAL | Status: DC
  Administered 2018-04-02 – 2018-04-03 (×2): 25 mg via ORAL
  Filled 2018-04-02 (×2): qty 1

## 2018-04-02 MED ORDER — ROCURONIUM BROMIDE 50 MG/5ML IV SOLN
INTRAVENOUS | Status: AC
  Filled 2018-04-02: qty 1

## 2018-04-02 MED ORDER — PROPOFOL 10 MG/ML IV BOLUS
INTRAVENOUS | Status: AC
  Filled 2018-04-02: qty 20

## 2018-04-02 MED ORDER — DIVALPROEX SODIUM ER 500 MG PO TB24
500.0000 mg | ORAL_TABLET | Freq: Two times a day (BID) | ORAL | Status: DC
  Administered 2018-04-02 – 2018-04-04 (×4): 500 mg via ORAL
  Filled 2018-04-02 (×5): qty 1

## 2018-04-02 MED ORDER — THROMBIN 5000 UNITS EX SOLR
CUTANEOUS | Status: AC
  Filled 2018-04-02: qty 5000

## 2018-04-02 MED ORDER — CEFAZOLIN SODIUM-DEXTROSE 2-4 GM/100ML-% IV SOLN
INTRAVENOUS | Status: AC
  Filled 2018-04-02: qty 100

## 2018-04-02 MED ORDER — GABAPENTIN 600 MG PO TABS
1200.0000 mg | ORAL_TABLET | Freq: Three times a day (TID) | ORAL | Status: DC
  Administered 2018-04-02 – 2018-04-04 (×6): 1200 mg via ORAL
  Filled 2018-04-02 (×8): qty 2

## 2018-04-02 MED ORDER — KETAMINE HCL 50 MG/ML IJ SOLN
INTRAMUSCULAR | Status: DC | PRN
  Administered 2018-04-02: 20 mg via INTRAMUSCULAR
  Administered 2018-04-02: 30 mg via INTRAVENOUS

## 2018-04-02 MED ORDER — ACETAMINOPHEN 650 MG RE SUPP: 650.0000 mg | RECTAL | Status: DC | PRN

## 2018-04-02 MED ORDER — VANCOMYCIN HCL IN DEXTROSE 1-5 GM/200ML-% IV SOLN
INTRAVENOUS | Status: AC
  Filled 2018-04-02: qty 200

## 2018-04-02 MED ORDER — DEXAMETHASONE SODIUM PHOSPHATE 10 MG/ML IJ SOLN
INTRAMUSCULAR | Status: DC | PRN
  Administered 2018-04-02: 4 mg via INTRAVENOUS

## 2018-04-02 MED ORDER — ROCURONIUM BROMIDE 100 MG/10ML IV SOLN
INTRAVENOUS | Status: DC | PRN
  Administered 2018-04-02: 30 mg via INTRAVENOUS
  Administered 2018-04-02 (×2): 10 mg via INTRAVENOUS

## 2018-04-02 MED ORDER — VANCOMYCIN HCL 10 G IV SOLR
1500.0000 mg | INTRAVENOUS | Status: DC
  Administered 2018-04-03 (×2): 1500 mg via INTRAVENOUS
  Filled 2018-04-02 (×3): qty 1500

## 2018-04-02 MED ORDER — SODIUM CHLORIDE 0.9 % IV SOLN
2.0000 g | INTRAVENOUS | Status: DC
  Administered 2018-04-03: 2 g via INTRAVENOUS
  Filled 2018-04-02 (×3): qty 2

## 2018-04-02 MED ORDER — ONDANSETRON HCL 4 MG/2ML IJ SOLN: 4.0000 mg | Freq: Once | INTRAMUSCULAR | Status: DC | PRN

## 2018-04-02 MED ORDER — DONEPEZIL HCL 5 MG PO TABS
10.0000 mg | ORAL_TABLET | Freq: Every day | ORAL | Status: DC
  Administered 2018-04-02 – 2018-04-03 (×2): 10 mg via ORAL
  Filled 2018-04-02 (×3): qty 2

## 2018-04-02 MED ORDER — VANCOMYCIN HCL 10 G IV SOLR: 1500.0000 mg | Freq: Three times a day (TID) | INTRAVENOUS | Status: DC

## 2018-04-02 MED ORDER — SIMVASTATIN 20 MG PO TABS
40.0000 mg | ORAL_TABLET | Freq: Every day | ORAL | Status: DC
  Administered 2018-04-02 – 2018-04-03 (×2): 40 mg via ORAL
  Filled 2018-04-02 (×2): qty 2

## 2018-04-02 MED ORDER — SODIUM CHLORIDE 0.9 % IV SOLN
INTRAVENOUS | Status: DC
  Administered 2018-04-02 (×2): via INTRAVENOUS

## 2018-04-02 MED ORDER — FENTANYL CITRATE (PF) 100 MCG/2ML IJ SOLN
25.0000 ug | INTRAMUSCULAR | Status: AC | PRN
  Administered 2018-04-02 (×6): 25 ug via INTRAVENOUS

## 2018-04-02 MED ORDER — DEXAMETHASONE SODIUM PHOSPHATE 10 MG/ML IJ SOLN
INTRAMUSCULAR | Status: AC
  Filled 2018-04-02: qty 1

## 2018-04-02 MED ORDER — LINAGLIPTIN 5 MG PO TABS
5.0000 mg | ORAL_TABLET | Freq: Every day | ORAL | Status: DC
  Administered 2018-04-02 – 2018-04-04 (×3): 5 mg via ORAL
  Filled 2018-04-02 (×3): qty 1

## 2018-04-02 MED ORDER — PROPOFOL 10 MG/ML IV BOLUS
INTRAVENOUS | Status: DC | PRN
  Administered 2018-04-02: 150 mg via INTRAVENOUS

## 2018-04-02 MED ORDER — PHENOL 1.4 % MT LIQD
1.0000 | OROMUCOSAL | Status: DC | PRN
  Filled 2018-04-02: qty 177

## 2018-04-02 MED ORDER — VANCOMYCIN HCL IN DEXTROSE 1-5 GM/200ML-% IV SOLN: 1000.0000 mg | Freq: Once | INTRAVENOUS | Status: DC

## 2018-04-02 MED ORDER — VASOPRESSIN 20 UNIT/ML IV SOLN
INTRAVENOUS | Status: AC
  Filled 2018-04-02: qty 1

## 2018-04-02 MED ORDER — PRIMIDONE 250 MG PO TABS
250.0000 mg | ORAL_TABLET | Freq: Every morning | ORAL | Status: DC
  Administered 2018-04-03 – 2018-04-04 (×2): 250 mg via ORAL
  Filled 2018-04-02 (×2): qty 1

## 2018-04-02 MED ORDER — OXYCODONE HCL 5 MG PO TABS
5.0000 mg | ORAL_TABLET | ORAL | Status: DC | PRN
  Administered 2018-04-03 (×4): 5 mg via ORAL
  Filled 2018-04-02 (×6): qty 1

## 2018-04-02 MED ORDER — TAMSULOSIN HCL 0.4 MG PO CAPS
0.4000 mg | ORAL_CAPSULE | Freq: Every day | ORAL | Status: DC
  Administered 2018-04-02 – 2018-04-03 (×2): 0.4 mg via ORAL
  Filled 2018-04-02 (×2): qty 1

## 2018-04-02 MED ORDER — BUPROPION HCL ER (SR) 150 MG PO TB12
150.0000 mg | ORAL_TABLET | Freq: Two times a day (BID) | ORAL | Status: DC
  Administered 2018-04-02 – 2018-04-04 (×4): 150 mg via ORAL
  Filled 2018-04-02 (×5): qty 1

## 2018-04-02 MED ORDER — ACETAMINOPHEN 500 MG PO TABS
1000.0000 mg | ORAL_TABLET | Freq: Four times a day (QID) | ORAL | Status: AC
  Administered 2018-04-02 – 2018-04-03 (×4): 1000 mg via ORAL
  Filled 2018-04-02 (×4): qty 2

## 2018-04-02 MED ORDER — DEXMEDETOMIDINE HCL 200 MCG/2ML IV SOLN
INTRAVENOUS | Status: DC | PRN
  Administered 2018-04-02: 8 ug via INTRAVENOUS
  Administered 2018-04-02 (×5): 4 ug via INTRAVENOUS

## 2018-04-02 MED ORDER — MIDAZOLAM HCL 2 MG/2ML IJ SOLN
INTRAMUSCULAR | Status: AC
  Filled 2018-04-02: qty 2

## 2018-04-02 MED ORDER — ONDANSETRON HCL 4 MG PO TABS: 4.0000 mg | ORAL_TABLET | Freq: Four times a day (QID) | ORAL | Status: DC | PRN

## 2018-04-02 MED ORDER — FENTANYL CITRATE (PF) 100 MCG/2ML IJ SOLN
INTRAMUSCULAR | Status: AC
  Filled 2018-04-02: qty 2

## 2018-04-02 MED ORDER — HYDROMORPHONE HCL 1 MG/ML IJ SOLN: 0.5000 mg | INTRAMUSCULAR | Status: DC | PRN

## 2018-04-02 MED ORDER — SODIUM CHLORIDE 0.9% FLUSH
3.0000 mL | Freq: Two times a day (BID) | INTRAVENOUS | Status: DC
  Administered 2018-04-02 – 2018-04-03 (×3): 3 mL via INTRAVENOUS

## 2018-04-02 MED ORDER — PRIMIDONE 50 MG PO TABS: 150.0000 mg | ORAL_TABLET | Freq: Two times a day (BID) | ORAL | Status: DC

## 2018-04-02 MED ORDER — ONDANSETRON HCL 4 MG/2ML IJ SOLN: 4.0000 mg | Freq: Four times a day (QID) | INTRAMUSCULAR | Status: DC | PRN

## 2018-04-02 MED ORDER — PHENYLEPHRINE HCL 10 MG/ML IJ SOLN
INTRAMUSCULAR | Status: DC | PRN
  Administered 2018-04-02 (×2): 200 ug via INTRAVENOUS
  Administered 2018-04-02 (×2): 100 ug via INTRAVENOUS

## 2018-04-02 MED ORDER — SODIUM CHLORIDE 0.9 % IV SOLN: 250.0000 mL | INTRAVENOUS | Status: DC

## 2018-04-02 MED ORDER — BUPIVACAINE-EPINEPHRINE (PF) 0.5% -1:200000 IJ SOLN
INTRAMUSCULAR | Status: AC
  Filled 2018-04-02: qty 30

## 2018-04-02 MED ORDER — TOBRAMYCIN SULFATE 1.2 G IJ SOLR
INTRAMUSCULAR | Status: AC
  Filled 2018-04-02: qty 1.2

## 2018-04-02 MED ORDER — SODIUM CHLORIDE 0.9 % IV SOLN
INTRAVENOUS | Status: DC
  Administered 2018-04-03: 06:00:00 via INTRAVENOUS

## 2018-04-02 MED ORDER — HYDROCHLOROTHIAZIDE 25 MG PO TABS
25.0000 mg | ORAL_TABLET | Freq: Every day | ORAL | Status: DC
  Administered 2018-04-04: 25 mg via ORAL
  Filled 2018-04-02: qty 1

## 2018-04-02 MED ORDER — VASOPRESSIN 20 UNIT/ML IV SOLN
INTRAVENOUS | Status: DC | PRN
  Administered 2018-04-02: 1 [IU] via INTRAVENOUS
  Administered 2018-04-02: 2 [IU] via INTRAVENOUS

## 2018-04-02 MED ORDER — CEFAZOLIN SODIUM-DEXTROSE 2-4 GM/100ML-% IV SOLN: 2.0000 g | Freq: Once | INTRAVENOUS | Status: DC

## 2018-04-02 MED ORDER — BUPIVACAINE-EPINEPHRINE (PF) 0.5% -1:200000 IJ SOLN
INTRAMUSCULAR | Status: DC | PRN
  Administered 2018-04-02: 10 mL

## 2018-04-02 MED ORDER — SODIUM CHLORIDE 0.9 % IV SOLN: 1.0000 g | Freq: Three times a day (TID) | INTRAVENOUS | Status: DC

## 2018-04-02 MED ORDER — ONDANSETRON HCL 4 MG/2ML IJ SOLN
INTRAMUSCULAR | Status: DC | PRN
  Administered 2018-04-02: 4 mg via INTRAVENOUS

## 2018-04-02 MED ORDER — BACITRACIN 50000 UNITS IM SOLR
INTRAMUSCULAR | Status: AC
  Filled 2018-04-02: qty 1

## 2018-04-02 MED ORDER — SODIUM CHLORIDE 0.9 % IV SOLN
2.0000 g | INTRAVENOUS | Status: AC
  Administered 2018-04-02: 2 g via INTRAVENOUS
  Filled 2018-04-02: qty 2

## 2018-04-02 MED ORDER — LIDOCAINE HCL (PF) 2 % IJ SOLN
INTRAMUSCULAR | Status: AC
  Filled 2018-04-02: qty 10

## 2018-04-02 MED ORDER — LISINOPRIL 20 MG PO TABS
20.0000 mg | ORAL_TABLET | Freq: Every day | ORAL | Status: DC
  Administered 2018-04-04: 20 mg via ORAL
  Filled 2018-04-02: qty 1

## 2018-04-02 MED ORDER — DIPHENOXYLATE-ATROPINE 2.5-0.025 MG PO TABS
1.0000 | ORAL_TABLET | Freq: Two times a day (BID) | ORAL | Status: DC
  Administered 2018-04-03 – 2018-04-04 (×3): 1 via ORAL
  Filled 2018-04-02 (×3): qty 1

## 2018-04-02 MED ORDER — PRIMIDONE 50 MG PO TABS
150.0000 mg | ORAL_TABLET | Freq: Every day | ORAL | Status: DC
  Administered 2018-04-02 – 2018-04-03 (×2): 150 mg via ORAL
  Filled 2018-04-02 (×3): qty 3

## 2018-04-02 MED ORDER — SODIUM CHLORIDE 0.9 % IR SOLN
Status: DC | PRN
  Administered 2018-04-02: 1000 mL

## 2018-04-02 MED ORDER — METHOCARBAMOL 500 MG PO TABS
500.0000 mg | ORAL_TABLET | Freq: Four times a day (QID) | ORAL | Status: DC | PRN
  Filled 2018-04-02: qty 1

## 2018-04-02 MED ORDER — MEMANTINE HCL 5 MG PO TABS
10.0000 mg | ORAL_TABLET | Freq: Two times a day (BID) | ORAL | Status: DC
  Administered 2018-04-02 – 2018-04-04 (×4): 10 mg via ORAL
  Filled 2018-04-02 (×4): qty 2

## 2018-04-02 MED ORDER — KETAMINE HCL 50 MG/ML IJ SOLN
INTRAMUSCULAR | Status: AC
  Filled 2018-04-02: qty 10

## 2018-04-02 MED ORDER — GLYCOPYRROLATE 0.2 MG/ML IJ SOLN
INTRAMUSCULAR | Status: DC | PRN
  Administered 2018-04-02: 0.4 mg via INTRAVENOUS

## 2018-04-02 MED ORDER — SENNOSIDES-DOCUSATE SODIUM 8.6-50 MG PO TABS: 1.0000 | ORAL_TABLET | Freq: Every evening | ORAL | Status: DC | PRN

## 2018-04-02 MED ORDER — SODIUM CHLORIDE 0.9% FLUSH: 3.0000 mL | INTRAVENOUS | Status: DC | PRN

## 2018-04-02 MED ORDER — FINASTERIDE 5 MG PO TABS
5.0000 mg | ORAL_TABLET | Freq: Every day | ORAL | Status: DC
  Administered 2018-04-03 – 2018-04-04 (×2): 5 mg via ORAL
  Filled 2018-04-02 (×2): qty 1

## 2018-04-02 MED ORDER — INSULIN ASPART 100 UNIT/ML ~~LOC~~ SOLN
0.0000 [IU] | Freq: Three times a day (TID) | SUBCUTANEOUS | Status: DC
  Administered 2018-04-02 – 2018-04-03 (×4): 2 [IU] via SUBCUTANEOUS
  Administered 2018-04-04 (×2): 3 [IU] via SUBCUTANEOUS
  Filled 2018-04-02 (×6): qty 1

## 2018-04-02 MED ORDER — SODIUM CHLORIDE (PF) 0.9 % IJ SOLN
INTRAMUSCULAR | Status: AC
  Filled 2018-04-02: qty 10

## 2018-04-02 MED ORDER — BUPIVACAINE HCL (PF) 0.5 % IJ SOLN
INTRAMUSCULAR | Status: AC
  Filled 2018-04-02: qty 30

## 2018-04-02 MED ORDER — VANCOMYCIN HCL IN DEXTROSE 1-5 GM/200ML-% IV SOLN
1000.0000 mg | Freq: Once | INTRAVENOUS | Status: AC
  Administered 2018-04-02: 1000 mg via INTRAVENOUS
  Filled 2018-04-02: qty 200

## 2018-04-02 MED ORDER — PANTOPRAZOLE SODIUM 40 MG PO TBEC
40.0000 mg | DELAYED_RELEASE_TABLET | Freq: Every day | ORAL | Status: DC
  Administered 2018-04-03 – 2018-04-04 (×2): 40 mg via ORAL
  Filled 2018-04-02 (×2): qty 1

## 2018-04-02 MED ORDER — MENTHOL 3 MG MT LOZG
1.0000 | LOZENGE | OROMUCOSAL | Status: DC | PRN
  Filled 2018-04-02: qty 9

## 2018-04-02 MED ORDER — METFORMIN HCL 500 MG PO TABS
500.0000 mg | ORAL_TABLET | Freq: Two times a day (BID) | ORAL | Status: DC
  Filled 2018-04-02: qty 1

## 2018-04-02 MED ORDER — LIDOCAINE HCL (CARDIAC) PF 100 MG/5ML IV SOSY
PREFILLED_SYRINGE | INTRAVENOUS | Status: DC | PRN
  Administered 2018-04-02: 50 mg via INTRAVENOUS

## 2018-04-02 MED ORDER — QUETIAPINE FUMARATE 25 MG PO TABS
125.0000 mg | ORAL_TABLET | Freq: Every day | ORAL | Status: DC
  Administered 2018-04-02 – 2018-04-03 (×2): 125 mg via ORAL
  Filled 2018-04-02 (×3): qty 5

## 2018-04-02 SURGICAL SUPPLY — 51 items
ADH SKN CLS APL DERMABOND .7 (GAUZE/BANDAGES/DRESSINGS) ×2
AGENT HMST MTR 8 SURGIFLO (HEMOSTASIS)
BULB RESERV EVAC DRAIN JP 100C (MISCELLANEOUS) ×4 IMPLANT
BUR NEURO DRILL SOFT 3.0X3.8M (BURR) ×2 IMPLANT
CANISTER SUCT 1200ML W/VALVE (MISCELLANEOUS) ×4 IMPLANT
CHLORAPREP W/TINT 26ML (MISCELLANEOUS) ×4 IMPLANT
COUNTER NEEDLE 20/40 LG (NEEDLE) ×2 IMPLANT
COVER LIGHT HANDLE STERIS (MISCELLANEOUS) ×4 IMPLANT
COVER WAND RF STERILE (DRAPES) ×2 IMPLANT
CUP MEDICINE 2OZ PLAST GRAD ST (MISCELLANEOUS) ×2 IMPLANT
DERMABOND ADVANCED (GAUZE/BANDAGES/DRESSINGS) ×2
DERMABOND ADVANCED .7 DNX12 (GAUZE/BANDAGES/DRESSINGS) ×2 IMPLANT
DRAIN CHANNEL JP 10F RND 20C F (MISCELLANEOUS) ×4 IMPLANT
DRAPE C-ARM 42X72 X-RAY (DRAPES) ×2 IMPLANT
DRAPE LAPAROTOMY 100X77 ABD (DRAPES) ×2 IMPLANT
DRAPE MICROSCOPE SPINE 48X150 (DRAPES) IMPLANT
DRAPE SURG 17X11 SM STRL (DRAPES) ×12 IMPLANT
ELECT CAUTERY BLADE TIP 2.5 (TIP) ×2
ELECT REM PT RETURN 9FT ADLT (ELECTROSURGICAL) ×2
ELECTRODE CAUTERY BLDE TIP 2.5 (TIP) ×1 IMPLANT
ELECTRODE REM PT RTRN 9FT ADLT (ELECTROSURGICAL) ×1 IMPLANT
FEE INTRAOP MONITOR IMPULS NCS (MISCELLANEOUS) IMPLANT
GLOVE BIOGEL PI IND STRL 7.0 (GLOVE) ×1 IMPLANT
GLOVE BIOGEL PI INDICATOR 7.0 (GLOVE) ×1
GLOVE SURG SYN 7.0 (GLOVE) ×4 IMPLANT
GLOVE SURG SYN 8.5  E (GLOVE) ×3
GLOVE SURG SYN 8.5 E (GLOVE) ×3 IMPLANT
GOWN SRG XL LVL 3 NONREINFORCE (GOWNS) ×1 IMPLANT
GOWN STRL NON-REIN TWL XL LVL3 (GOWNS) ×2
GOWN STRL REUS W/TWL MED LVL3 (GOWN DISPOSABLE) ×2 IMPLANT
INTRAOP MONITOR FEE IMPULS NCS (MISCELLANEOUS)
INTRAOP MONITOR FEE IMPULSE (MISCELLANEOUS)
KIT STIMULAN RAPID CURE 5CC (Orthopedic Implant) ×2 IMPLANT
KIT TURNOVER KIT A (KITS) ×2 IMPLANT
MARKER SKIN DUAL TIP RULER LAB (MISCELLANEOUS) ×2 IMPLANT
NDL SAFETY ECLIPSE 18X1.5 (NEEDLE) ×1 IMPLANT
NEEDLE HYPO 18GX1.5 SHARP (NEEDLE) ×2
NEEDLE HYPO 22GX1.5 SAFETY (NEEDLE) ×2 IMPLANT
NS IRRIG 1000ML POUR BTL (IV SOLUTION) ×2 IMPLANT
PACK LAMINECTOMY NEURO (CUSTOM PROCEDURE TRAY) ×2 IMPLANT
SPOGE SURGIFLO 8M (HEMOSTASIS)
SPONGE SURGIFLO 8M (HEMOSTASIS) IMPLANT
STAPLER SKIN PROX 35W (STAPLE) IMPLANT
SUT SILK 2 0SH CR/8 30 (SUTURE) ×2 IMPLANT
SUT V-LOC 90 ABS DVC 3-0 CL (SUTURE) ×4 IMPLANT
SUT VIC AB 0 CT1 18XCR BRD 8 (SUTURE) ×1 IMPLANT
SUT VIC AB 0 CT1 8-18 (SUTURE) ×2
SUT VIC AB 2-0 CT1 18 (SUTURE) ×4 IMPLANT
SYR 10ML LL (SYRINGE) ×2 IMPLANT
SYR 30ML LL (SYRINGE) ×4 IMPLANT
TOWEL OR 17X26 4PK STRL BLUE (TOWEL DISPOSABLE) ×6 IMPLANT

## 2018-04-02 NOTE — Progress Notes (Addendum)
Pharmacy Antibiotic Note  James Hood is a 77 y.o. male admitted on 04/02/2018 with surgical prophylaxis.  Pharmacy has been consulted for cefepime and vancomycin dosing. Patient is POD #0 s/p SCS hardware removal due to infection. Previous wound culture positive for proteus mirabilis.   Ke: 0.044 hr-1, t1/2: 15.75, Vd 57.54 L  Plan: Will give vancomycin 1000 mg x 1 followed by a maintenance dose of 1500 mg q24h with 6 hour stacked dosing. Predictive trough of ~8mcg/mL. Goal trough 15-20 mcg/mL. Plan to order vancomycin level prior to the 4th dose.   Will start cefepime 2 g q24H due to CrCl between 30-50 ml/min.   Height: 5\' 10"  (177.8 cm) Weight: 181 lb 3.5 oz (82.2 kg) IBW/kg (Calculated) : 73  Temp (24hrs), Avg:96.9 F (36.1 C), Min:95.1 F (35.1 C), Max:97.9 F (36.6 C)  No results for input(s): WBC, CREATININE, LATICACIDVEN, VANCOTROUGH, VANCOPEAK, VANCORANDOM, GENTTROUGH, GENTPEAK, GENTRANDOM, TOBRATROUGH, TOBRAPEAK, TOBRARND, AMIKACINPEAK, AMIKACINTROU, AMIKACIN in the last 168 hours.  Estimated Creatinine Clearance: 47.6 mL/min (by C-G formula based on SCr of 1.32 mg/dL).    No Known Allergies  Antimicrobials this admission: 11/13 cefepime >>  11/13 tobramycin >> 11/13 11/13 vancomycin >>   Dose adjustments this admission: Cefepime 2 g q24 due to CrCl < 50 ml/min   Microbiology results: 11/13 Cytology Misc. fluid; Body Fluid Cx: Pending  Thank you for allowing pharmacy to be a part of this patient's care.  Oswald Hillock 04/02/2018 3:31 PM

## 2018-04-02 NOTE — H&P (Signed)
Primary Physician:  Leone Haven, MD  Chief Complaint:  Implant complication  History of Present Illness: 04/02/2018 James Hood is a 77 y.o. male who presents with the chief complaint of wound complication with draining ulcer at the bottom of the IPG pocket.  He has positive cultures for Proteus sensitive to many different antibiotics.   He presents today for removal of his IPG.  Review of Systems:  A 10 point review of systems is negative, except for the pertinent positives and negatives detailed in the HPI.  Past Medical History: Past Medical History:  Diagnosis Date  . Anxiety   . Anxiety and depression   . Arthritis   . BPH (benign prostatic hyperplasia)   . 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 Colon polyps   . Dementia ([place])   . Depression   . Diabetes mellitus type 2 in nonobese (HCC)   . GERD (gastroesophageal reflux disease)    BARRETTS ESOPHAGUS RESOLVED PER PATIENT  . Headache   . 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 History of hiatal hernia   . Hypercholesteremia   . Hypertension   . Hyperthyroidism   . Neuropathic pain   . Primary localized osteoarthritis of right knee   . Stomach ulcer   . Tremor, essential     Past Surgical History: Past Surgical History:  Procedure Laterality Date  . BACK SURGERY    . COLONOSCOPY WITH PROPOFOL N/A 09/14/2016   Procedure: COLONOSCOPY WITH PROPOFOL;  Surgeon: Manya Silvas, MD;  Location: [facility] ENDOSCOPY;  Service: Endoscopy;  Laterality: N/A;  . esophageal stretch    . JOINT REPLACEMENT Right 2016   knee  . LUMBAR LAMINECTOMY/DECOMPRESSION MICRODISCECTOMY Left 02/03/2016   Procedure: LEFT L5-S1 DISKECTOMY;  Surgeon: Leeroy Cha, MD;  Location: [facility]URO ORS;  Service: Neurosurgery;  Laterality: Left;  LEFT L5-S1 DISKECTOMY  . PULSE GENERATOR IMPLANT N/A 08/21/2017   Procedure: UNILATERAL PULSE GENERATOR IMPLANT;  Surgeon: Meade Maw, MD;  Location: [facility] ORS;  Service:  Neurosurgery;  Laterality: N/A;  . TONSILLECTOMY    . TOTAL KNEE ARTHROPLASTY Right 11/15/2014   Procedure: TOTAL KNEE ARTHROPLASTY;  Surgeon: Elsie Saas, MD;  Location: [place];  Service: Orthopedics;  Laterality: Right;    Allergies: Allergies as of 03/27/2018 - Review Complete 03/19/2018  Allergen Reaction Noted  . No known allergies  02/02/2016    Medications:  Current Facility-Administered Medications:  .  0.9 %  sodium chloride infusion, , Intravenous, Continuous, Durenda Hurt, MD, Last Rate: 75 mL/hr at 04/02/18 0921 .  ceFAZolin (ANCEF) 2-4 GM/100ML-% IVPB, , , ,  .  ceFAZolin (ANCEF) IVPB 2g/100 mL premix, 2 g, Intravenous, Once, Meade Maw, MD .  vancomycin (VANCOCIN) 1-5 GM/200ML-% IVPB, , , ,  .  vancomycin (VANCOCIN) IVPB 1000 mg/200 mL premix, 1,000 mg, Intravenous, Once, Meade Maw, MD   Social History: Social History   Tobacco Use  . Smoking status: Current Some Day Smoker    Packs/day: 0.50    Years: 60.00    Pack years: 30.00    Types: Cigarettes  . Smokeless tobacco: Never Used  Substance Use Topics  . 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 Drug use: Never    Family Medical History: Family History  Problem Relation Age of Onset  . Heart attack Mother   . Heart disease Mother   .  Hypertension Father   . Depression Father   . Kidney cancer Neg Hx   . Prostate cancer Neg Hx     Physical Examination: Vitals:   04/02/18 0904  BP: 111/67  Pulse: 84  Resp: 20  Temp: (!) 95.1 F (35.1 C)  SpO2: 95%     General: Patient is well developed, well nourished, calm, collected, and in no apparent distress.  Psychiatric: Patient is non-anxious.  Head:  Pupils equal, round, and reactive to light.  ENT:  Oral mucosa appears well hydrated.  Neck:   Supple.  Full range of motion.  Respiratory: Patient is breathing without any  difficulty.  Extremities: No edema.  Vascular: Palpable pulses in dorsal pedal vessels.  Skin:   On exposed skin, there are no abnormal skin lesions.  Heart sounds normal no MRG. Chest Clear to Auscultation Bilaterally.  NEUROLOGICAL:  General: In no acute distress.   Awake, alert, oriented to person, place, and time.  Pupils equal round and reactive to light.  Facial tone is symmetric.  Tongue protrusion is midline.  There is no pronator drift.   Strength: Side Biceps Triceps Deltoid Interossei Grip Wrist Ext. Wrist Flex.  R 5 5 5 5 5 5 5   L 5 5 5 5 5 5 5    Side Iliopsoas Quads Hamstring PF DF EHL  R 5 5 5 5 5 5   L 5 5 5 5 5 5    Incision shows ulcer inferior to the IPG incision.  His other incision is healed.  Imaging: None to review  I have personally reviewed the images and agree with the above interpretation.  Assessment and Plan: James Hood is a pleasant 77 y.o. male with implant complication.  He presents today for removal.  We reviewed the plan of removing the IPG, cleaning the distal lead, retunneling, and reclosure.  He would then take antibiotics for 2-4 weeks, after which he would have a period without antibiotics.  If he shows no evidence of recurrent infection, I would then reimplant the IPG.  If the lead shows gross contamination today, I will have to remove the lead.  He expressed understanding of this.  James Hood K. [name] MD, [place] Dept[facility] of Neurosurgery

## 2018-04-02 NOTE — Discharge Instructions (Addendum)
NEUROSURGERY DISCHARGE INSTRUCTIONS  Admission Diagnosis:  SCS battery wound infection  Discharge Diagnosis: SCS battery wound infection    Operative procedure: Wound debridement  The following are instructions to help in your recovery once you have been discharged from the hospital. Even if you feel well, it is important that you follow these activity guidelines. If you do not let your neck heal properly from the surgery, you can increase the chance of return of your symptoms and other complications.   What to do after you leave the hospital:  Recommended diet:  Increase protein intake to promote wound healing. You may return to your usual diet. However, you may experience discomfort when swallowing in the first month after your surgery. This is normal. You may find that softer foods are more comfortable for you to swallow. Be sure to stay hydrated.   Recommended activity: No bending, lifting, or twisting (BLT). Avoid lifting objects heavier than 10 pounds (gallon milk jug). Where possible, avoid household activities that involve lifting, bending, reaching, pushing, or pulling such as laundry, vacuuming, grocery shopping, and childcare. Try to arrange for help from friends and family for these activities while your back heals.   Increase physical activity slowly as tolerated. Taking short walks is encouraged, but avoid strenuous exercise. Do not jog, run, bicycle, lift weights, or participate in any other exercises unless specifically allowed by your doctor.   You should not drive until cleared by your doctor.   Until released by your doctor, you should not return to work or school. You should rest at home and let your body heal.   You may shower the day after your surgery. After showering, lightly dab your incision dry. Do not take a tub bath or go swimming until approved by your doctor at your follow-up appointment.   If you smoke, we strongly recommend that you quit. Smoking has been  proven to interfere with normal bone healing and will dramatically reduce the success rate of your surgery. Please contact [facility] (800-QUIT-NOW) and use the resources at www.[facility].com for assistance in stopping smoking.   Medications  Do not restart Aspirin until seven days after surgery  * Do not take anti-inflammatory medications for 3 months after surgery (naproxen [Aleve], ibuprofen [Advil, Motrin], celecoxib [Celebrex], etc.). These medications can prevent your bones from healing properly.   You may restart home medications.   Wound Care Instructions  If you have a dressing on your incision, remove it two days after your surgery. Keep your incision area clean and dry.   If you have staples or stitches on your incision, you should have a follow up scheduled for removal. If you do not have staples or stitches, you will have steri-strips (small pieces of surgical tape) or Dermabond glue. The steri-strips/glue should begin to peel away within about a week (it is fine if the steri-strips fall off before then). If the strips are still in place one week after your surgery, you may gently remove them.    Please Report any of the following: You may experience pain in your neck and/or pain between your shoulder blades. This is normal and should improve in the next few weeks with the help of pain medication, muscle relaxers, and rest. Some patients report that a warm compress on the back of the neck or between the shoulder blades helps.   However, should you experience any of the following, contact us immediately:   New numbness or weakness   Pain that is progressively getting worse,  and is not relieved by your pain medication, muscle relaxers, rest, and warm compresses   Bleeding, redness, swelling, pain, or drainage from surgical incision   Chills or flu-like symptoms   Fever greater than 101.0 F (38.3 C)   Inability to eat, drink fluids, or take medications   Problems with  bowel or bladder functions   Difficulty breathing or shortness of breath   Warmth, tenderness, or swelling in your calf    Additional Follow up appointments During office hours (Monday-Friday 9 am to 5 pm), please call your physician at [phone]  After hours and weekends, please call the [facility] Operator at  [phone] and ask for the Neurosurgeon On Call   For a life-threatening emergency, call 911   [facility] neurosurgery 04/14/2018 at 9AM Schedule 2 week follow-up with Dr. Delaine Lame: Call [phone] for appt

## 2018-04-02 NOTE — Transfer of Care (Signed)
Immediate Anesthesia Transfer of Care Note  Patient: James Hood  Procedure(s) Performed: REMOVAL OF PULSE GENERATOR IMPLANT AND LEADS (Right Back)  Patient Location: PACU  Anesthesia Type:General  Level of Consciousness: awake and alert   Airway & Oxygen Therapy: Patient Spontanous Breathing and Patient connected to face mask oxygen  Post-op Assessment: Report given to RN, Post -op Vital signs reviewed and stable and Patient moving all extremities X 4  Post vital signs: Reviewed  Last Vitals:  Vitals Value Taken Time  BP 92/56 04/02/2018  1:14 PM  Temp    Pulse 78 04/02/2018  1:14 PM  Resp 15 04/02/2018  1:14 PM  SpO2 100 % 04/02/2018  1:14 PM  Vitals shown include unvalidated device data.  Last Pain:  Vitals:   04/02/18 0904  TempSrc: Tympanic         Complications: No apparent anesthesia complications

## 2018-04-02 NOTE — Anesthesia Post-op Follow-up Note (Signed)
Anesthesia QCDR form completed.        

## 2018-04-02 NOTE — Progress Notes (Signed)
Procedure: SCS hardware removal Procedure Date: 04/02/2018 Diagnosis: Infection  History: James Hood is POD0 s/p SCS hardware removal due to infection. Previous wound culture positive for proteus mirabilis. He is recovering well. Complains of 6-8/10 right buttock pain. Denies any lower extremity complaints at this time.   Physical Exam: Vitals:   04/02/18 1429 04/02/18 1440  BP: (!) 94/59 102/66  Pulse: 77 77  Resp: 13 18  Temp: 97.9 F (36.6 C)   SpO2: 96% 97%    AA Ox3 Strength:5/5 throughout lower extremities.  Sensation: intact and symmetric  Data:  No results for input(s): NA, K, CL, CO2, BUN, CREATININE, LABGLOM, GLUCOSE, CALCIUM in the last 168 hours. No results for input(s): AST, ALT, ALKPHOS in the last 168 hours.  Invalid input(s): TBILI   No results for input(s): WBC, HGB, HCT, PLT in the last 168 hours. Recent Labs  Lab 04/01/18 0839  APTT 35  INR 0.91         Other tests/results: No imaging reviewed  Assessment/Plan:  James Hood is POD0 s/p SCS hardware removal for infection. Recovering well.  - ID consult - IV antibiotics with pharmacy consult - mobilize - pain control - DVT prophylaxis - monitor drains  Marin Olp PA-C Department of Neurosurgery

## 2018-04-02 NOTE — OR Nursing (Signed)
Explanted pulse generator and leads from back.

## 2018-04-02 NOTE — Anesthesia Postprocedure Evaluation (Signed)
Anesthesia Post Note  Patient: James Hood  Procedure(s) Performed: REMOVAL OF PULSE GENERATOR IMPLANT AND LEADS (Right Back)  Patient location during evaluation: PACU Anesthesia Type: General Level of consciousness: awake and alert and oriented Pain management: pain level controlled Vital Signs Assessment: post-procedure vital signs reviewed and stable Respiratory status: spontaneous breathing Cardiovascular status: blood pressure returned to baseline Anesthetic complications: no     Last Vitals:  Vitals:   04/02/18 1500 04/02/18 1530  BP: 102/63 107/66  Pulse: 75 73  Resp:    Temp: (!) 36.3 C   SpO2: 98% 99%    Last Pain:  Vitals:   04/02/18 1510  TempSrc:   PainSc: 4                  Skyra Crichlow

## 2018-04-02 NOTE — Op Note (Signed)
Indications: the patient is a 77 yo male who was diagnosed with postlaminectomy syndrome, and ultimately had a spinal cord stimulator placed.  He returned to clinic last week with an open ulcer at the bottom of the pulse generator.  He was advised that removal and exploration was indicated.  Initial plan was removal of the IPG with possible lead salvage.  Findings: likely infectious material tracking along the lead and IPG    Preoperative Diagnosis: Implant complication Postoperative Diagnosis: Implant complication with wound infection     EBL: 50 ml IVF: 800 ml Drains: none Disposition: Extubated and Stable to PACU Complications: none   No foley catheter was placed.     Preoperative Note:    Risks of surgery discussed in clinic.   Operative Note:      The patient was then brought from the preoperative center with intravenous access established.  The patient underwent general anesthesia and endotracheal tube intubation, then was rotated on the [facility] table where all pressure points were appropriately padded.  The incisions were identified and the entire back prepped.  A timeout was performed.  Preoperative antibiotics were held until the device was cultured.    The buttock (pulse generator) incision was opened and the pocket entered until the IPG was identified.  There was scant fluid around the generator.  The fluid tracked along the lead into the superior margin of the pocket.  Due to the obvious tracking into the lead, the decision was made to explant the entire device due to risk of infected lead at the thoracic incision.  The thoracic incision was then opened with a scalpel.  The lead was identified, and pus was noted to be coming from inferiorly along the tract, confirming the decision for a complete explant.  The lead was divided, and the IPG removed from the field.  The tackdown suture for the spinal cord portion of the lead was divided and removed, then the paddle lead  carefully removed.  The entire device was confirmed to be removed.  At this point, the wounds were carefully irrigated with bacitracin, betadine, and then debrided.  Drains were placed at both sites.  Tobramycin beads were placed in each wound.     The wounds were closed in layers with 0 and 2-0 vicryl.  The skin was approximated with 3-0 nylon. Each drain was secured.     Patient was then rotated back to the preoperative bed awakened from anesthesia and taken to recovery all counts are correct in this case.   I performed the entire procedure with the assistance of Marin Olp PA as an Pensions consultant.     Meade Maw MD

## 2018-04-02 NOTE — Anesthesia Procedure Notes (Signed)
Procedure Name: Intubation Date/Time: 04/02/2018 11:13 AM Performed by: Rolla Plate, CRNA Pre-anesthesia Checklist: Patient identified, Suction available, Emergency Drugs available, Patient being monitored and Timeout performed Patient Re-evaluated:Patient Re-evaluated prior to induction Oxygen Delivery Method: Circle system utilized Preoxygenation: Pre-oxygenation with 100% oxygen Induction Type: IV induction Ventilation: Oral airway inserted - appropriate to patient size Laryngoscope Size: McGraph and 4 Grade View: Grade I Tube type: Oral Tube size: 7.5 mm Number of attempts: 1 Airway Equipment and Method: Stylet Placement Confirmation: ETT inserted through vocal cords under direct vision,  positive ETCO2 and breath sounds checked- equal and bilateral Secured at: 22 cm Tube secured with: Tape Dental Injury: Teeth and Oropharynx as per pre-operative assessment

## 2018-04-02 NOTE — Anesthesia Preprocedure Evaluation (Signed)
Anesthesia Evaluation  Patient identified by MRN, date of birth, ID band Patient awake    Reviewed: Allergy & Precautions, NPO status , Patient's Chart, lab work & pertinent test results, reviewed documented beta blocker date and time   History of Anesthesia Complications Negative for: history of anesthetic complications  Airway Mallampati: III       Dental   Pulmonary neg sleep apnea, neg COPD, Current Smoker,           Cardiovascular hypertension, Pt. on medications and Pt. on home beta blockers (-) Past MI and (-) CHF (-) dysrhythmias (-) Valvular Problems/Murmurs     Neuro/Psych  Headaches, neg Seizures PSYCHIATRIC DISORDERS Anxiety Depression Dementia  Neuromuscular disease    GI/Hepatic Neg liver ROS, hiatal hernia, PUD, GERD  Medicated and Controlled,  Endo/Other  diabetes, Type 2, Oral Hypoglycemic Agents  Renal/GU negative Renal ROS     Musculoskeletal  (+) Arthritis ,   Abdominal   Peds  Hematology  (+) anemia ,   Anesthesia Other Findings   Reproductive/Obstetrics                             Anesthesia Physical  Anesthesia Plan  ASA: III  Anesthesia Plan: General   Post-op Pain Management:    Induction: Intravenous  PONV Risk Score and Plan:   Airway Management Planned: Oral ETT  Additional Equipment:   Intra-op Plan:   Post-operative Plan: Extubation in OR  Informed Consent: I have reviewed the patients History and Physical, chart, labs and discussed the procedure including the risks, benefits and alternatives for the proposed anesthesia with the patient or authorized representative who has indicated his/her understanding and acceptance.     Plan Discussed with:   Anesthesia Plan Comments:         Anesthesia Quick Evaluation

## 2018-04-03 ENCOUNTER — Observation Stay: Payer: Self-pay

## 2018-04-03 ENCOUNTER — Encounter: Payer: Self-pay | Admitting: Neurosurgery

## 2018-04-03 DIAGNOSIS — Z96651 Presence of right artificial knee joint: Secondary | ICD-10-CM

## 2018-04-03 DIAGNOSIS — M1711 Unilateral primary osteoarthritis, right knee: Secondary | ICD-10-CM | POA: Diagnosis not present

## 2018-04-03 DIAGNOSIS — F329 Major depressive disorder, single episode, unspecified: Secondary | ICD-10-CM

## 2018-04-03 DIAGNOSIS — I10 Essential (primary) hypertension: Secondary | ICD-10-CM | POA: Diagnosis not present

## 2018-04-03 DIAGNOSIS — F17200 Nicotine dependence, unspecified, uncomplicated: Secondary | ICD-10-CM

## 2018-04-03 DIAGNOSIS — T85733A Infection and inflammatory reaction due to implanted electronic neurostimulator of spinal cord, electrode (lead), initial encounter: Secondary | ICD-10-CM | POA: Diagnosis not present

## 2018-04-03 DIAGNOSIS — G250 Essential tremor: Secondary | ICD-10-CM | POA: Diagnosis not present

## 2018-04-03 DIAGNOSIS — G8929 Other chronic pain: Secondary | ICD-10-CM

## 2018-04-03 DIAGNOSIS — Z79899 Other long term (current) drug therapy: Secondary | ICD-10-CM

## 2018-04-03 DIAGNOSIS — Z7984 Long term (current) use of oral hypoglycemic drugs: Secondary | ICD-10-CM

## 2018-04-03 DIAGNOSIS — R358 Other polyuria: Secondary | ICD-10-CM

## 2018-04-03 DIAGNOSIS — F419 Anxiety disorder, unspecified: Secondary | ICD-10-CM | POA: Diagnosis not present

## 2018-04-03 DIAGNOSIS — B964 Proteus (mirabilis) (morganii) as the cause of diseases classified elsewhere: Secondary | ICD-10-CM | POA: Diagnosis not present

## 2018-04-03 DIAGNOSIS — G3189 Other specified degenerative diseases of nervous system: Secondary | ICD-10-CM | POA: Diagnosis not present

## 2018-04-03 DIAGNOSIS — M5442 Lumbago with sciatica, left side: Secondary | ICD-10-CM

## 2018-04-03 DIAGNOSIS — T85738A Infection and inflammatory reaction due to other nervous system device, implant or graft, initial encounter: Secondary | ICD-10-CM | POA: Diagnosis not present

## 2018-04-03 LAB — GLUCOSE, CAPILLARY
Glucose-Capillary: 122 mg/dL — ABNORMAL HIGH (ref 70–99)
Glucose-Capillary: 136 mg/dL — ABNORMAL HIGH (ref 70–99)
Glucose-Capillary: 140 mg/dL — ABNORMAL HIGH (ref 70–99)
Glucose-Capillary: 128 mg/dL — ABNORMAL HIGH (ref 70–99)
Glucose-Capillary: 199 mg/dL — ABNORMAL HIGH (ref 70–99)

## 2018-04-03 MED ORDER — SODIUM CHLORIDE 0.9 % IV SOLN
2.0000 g | Freq: Two times a day (BID) | INTRAVENOUS | Status: DC
  Administered 2018-04-03 – 2018-04-04 (×2): 2 g via INTRAVENOUS
  Filled 2018-04-03 (×4): qty 2

## 2018-04-03 MED ORDER — SODIUM CHLORIDE 0.9% FLUSH
10.0000 mL | Freq: Two times a day (BID) | INTRAVENOUS | Status: DC
  Administered 2018-04-04: 10 mL

## 2018-04-03 MED ORDER — SODIUM CHLORIDE 0.9% FLUSH: 10.0000 mL | INTRAVENOUS | Status: DC | PRN

## 2018-04-03 NOTE — Progress Notes (Signed)
Procedure: SCS hardware removal Procedure Date: 04/02/2018 Diagnosis: Infection  History: POD1: He continues to do well. Pain 5/10, again mainly in the right buttock. He has ambulated to restroom and voided without issue.  Denies lower extremity pain/numbness/tingling/weakness.  Drain output 15 total.   POD0:  James Hood is POD0 s/p SCS hardware removal due to infection. Previous wound culture positive for proteus mirabilis. He is recovering well. Complains of 6-8/10 right buttock pain. Denies any lower extremity complaints at this time.   Physical Exam: Vitals:   04/03/18 0410 04/03/18 0412  BP: (!) 91/57 (!) 108/57  Pulse: 78 73  Resp: 18   Temp: 97.9 F (36.6 C)   SpO2: 94%     AA Ox3 Strength:5/5 throughout lower extremities.  Sensation: intact and symmetric Skin: thoracic incision: scant blood on dressing. Buttock incisions: dressing clean and dry.  Drain sites: dressing clean and dry. No active bleeding.    Data:  No results for input(s): NA, K, CL, CO2, BUN, CREATININE, LABGLOM, GLUCOSE, CALCIUM in the last 168 hours. No results for input(s): AST, ALT, ALKPHOS in the last 168 hours.  Invalid input(s): TBILI   No results for input(s): WBC, HGB, HCT, PLT in the last 168 hours. Recent Labs  Lab 04/01/18 0839  APTT 35  INR 0.91         Other tests/results: No imaging reviewed  Assessment/Plan:  James Hood is POD1 s/p SCS hardware removal for infection. Appreciate pharmacy consult for antibiotic dosing. Will await ID consult for appropriate plan.   - ID consult - mobilize - pain control - DVT prophylaxis - monitor drains  Marin Olp PA-C Department of Neurosurgery

## 2018-04-03 NOTE — Treatment Plan (Signed)
Diagnosis: Spinal Device infection Baseline Creatinine 1.32  Culture Result: Protues  No Known Allergies  OPAT Orders Discharge antibiotics: Cefepime 2 grams IV every 12 hours  End Date: 05/02/18  [facility] Care Per Protocol:  Labs weekly ( Monday) while on IV antibiotics: _X_ CBC with differential  _X_ CMP  _X_ ESR   _X_ Please pull PIC at completion of IV antibiotics   Fax weekly labs to [phone]  Clinic Follow Up Appt: 2 weeks -   Call [phone] for appt

## 2018-04-03 NOTE — Progress Notes (Signed)
PHARMACY CONSULT NOTE FOR:  OUTPATIENT  PARENTERAL ANTIBIOTIC THERAPY (OPAT)  Indication: Spinal device infection Regimen: cefepime 2 g q12H  End date: 05/02/18  IV antibiotic discharge orders are pended. To discharging provider:  please sign these orders via discharge navigator,  Select New Orders & click on the button choice - Manage This Unsigned Work.     Thank you for allowing pharmacy to be a part of this patient's care.  Oswald Hillock, PharmD  Clinical Pharmacist 04/03/2018, 4:02 PM

## 2018-04-03 NOTE — Consult Note (Signed)
NAME: James Hood  DOB: Feb 22, 1941  MRN: [id number]  Date/Time: 04/03/2018 2:44 PM  James Hood Subjective:  REASON FOR CONSULT: Spinal stimulator infection ? James Hood is a 77 y.o. with a history ofHTN, Essential tremors, OA, RT TKA, Degenerative spine disease with sciatica, s/p laminectomy , spine stimulator placement April 2019 for chronic pain with infection at the site was admitted for removal of the device yesterday. I am asked to see patient for antibiotic recommendation. 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 He had L5-S1 laminectomy/discectomy which did not help the pain. He was getting epidural injections with little relief,. Then after a successful trial for spinal cord stimulation he had placement of a permanent spinal cord stimulator on 08/21/17 by James Hood. A paddle lead was placed on the dura at T10  With advancement of the lead to T8/9 disc space. A pocket was formed at the rt buttock and pulse generator was placed. The buttock site was starting to drain since then and never healed completely. In April he was given augmentin, In May he was given clindamycin  For 3 weeks  In June he was given Trimethoprim/sulfa for 3 weeks . His antibiotics were stopped during his visit on 11/14/17. During his visit on 12/26/17 to see James Hood his note mentions Thoracic spinal incision - healing well without tenderness, drainage, erythema, or fluctuance. Buttock incision significant for superficial dehiscence but no drainage currently. There is minimal erythema. No antibiotics were given.he had intermittent scabbing at the battery site incision Then on 03/27/18 he went to see JamesY with soreness at incision site. There was discharge and during that visit the wound was swabbed for culture and he was given clindamycin. The culture came back as James Hood and the antibiotic was changed to augmentin and he was brought in for removal of the IPG     The OR note reads as The buttock  (pulse generator) incision was opened and the pocket entered until the IPG was identified.  There was scant fluid around the generator.  The fluid tracked along the lead into the superior margin of the pocket.  Due to the obvious tracking into the lead, the decision was made to explant the entire device due to risk of infected lead at the thoracic incision. The thoracic incision was then opened with a scalpel.  The lead was identified, and pus was noted to be coming from inferiorly along the tract, confirming the decision for a complete explant. James Hood is growing from the culture  Medical history HTN DM Hypercholesterolemia DDD Barretss esophagus OA rt knee Dementia GERD Anxiety, depression' BPH    Surgical History Laminectomy, L5-S1 TKA RT Tonsillectomy Spinal cord stimulator implant  SH Smoker Lives with his wife Has a dog and sleeps with him in bed Family History  Problem Relation Age of Onset  . Heart attack Mother   . Heart disease Mother   . Hypertension Father   . Depression Father   . Kidney cancer Neg Hx   . Prostate cancer Neg Hx    No Known Allergies  ? Current Facility-Administered Medications  Medication Dose Route Frequency Provider Last Rate Last Dose  . 0.9 %  sodium chloride infusion   Intravenous Continuous Marin Olp, PA-C 75 mL/hr at 04/03/18 9767    . 0.9 %  sodium chloride infusion  250 mL Intravenous Continuous Marin Olp, PA-C      . acetaminophen (TYLENOL) tablet 650 mg  650 mg Oral Q4H PRN Marin Olp, PA-C  Or  . acetaminophen (TYLENOL) suppository 650 mg  650 mg Rectal Q4H PRN Marin Olp, PA-C      . buPROPion [facility] SR) 12 hr tablet 150 mg  150 mg Oral BID Marin Olp, PA-C   150 mg at 04/03/18 0911  . ceFEPIme (MAXIPIME) 2 g in sodium chloride 0.9 % 100 mL IVPB  2 g Intravenous Q24H Oswald Hillock, RPH 200 mL/hr at 04/03/18 1237 2 g at 04/03/18 1237  . diphenoxylate-atropine (LOMOTIL) 2.5-0.025 MG per tablet 1  tablet  1 tablet Oral BID Marin Olp, PA-C   1 tablet at 04/03/18 0911  . divalproex (DEPAKOTE ER) 24 hr tablet 500 mg  500 mg Oral BID Marin Olp, PA-C   500 mg at 04/03/18 0910  . donepezil (ARICEPT) tablet 10 mg  10 mg Oral QHS Marin Olp, PA-C   10 mg at 04/02/18 2318  . fenofibrate tablet 160 mg  160 mg Oral Daily Marin Olp, PA-C   160 mg at 04/03/18 0911  . finasteride (PROSCAR) tablet 5 mg  5 mg Oral Daily Marin Olp, PA-C   5 mg at 04/03/18 0910  . gabapentin (NEURONTIN) tablet 1,200 mg  1,200 mg Oral TID Marin Olp, PA-C   1,200 mg at 04/03/18 0911  . lisinopril (PRINIVIL,ZESTRIL) tablet 20 mg  20 mg Oral Daily Meade Maw, MD       And  . hydrochlorothiazide (HYDRODIURIL) tablet 25 mg  25 mg Oral Daily Meade Maw, MD      . HYDROmorphone (DILAUDID) injection 0.5 mg  0.5 mg Intravenous Q2H PRN Marin Olp, PA-C      . insulin aspart (novoLOG) injection 0-15 Units  0-15 Units Subcutaneous TID WC Marin Olp, PA-C   2 Units at 04/03/18 1236  . linagliptin (TRADJENTA) tablet 5 mg  5 mg Oral Daily Marin Olp, PA-C   5 mg at 04/03/18 0911  . memantine (NAMENDA) tablet 10 mg  10 mg Oral BID Marin Olp, PA-C   10 mg at 04/03/18 0911  . menthol-cetylpyridinium (CEPACOL) lozenge 3 mg  1 lozenge Oral PRN Marin Olp, PA-C       Or  . phenol (CHLORASEPTIC) mouth spray 1 spray  1 spray Mouth/Throat PRN Marin Olp, PA-C      . metFORMIN (GLUCOPHAGE) tablet 1,000 mg  1,000 mg Oral BID WC Marin Olp, PA-C   1,000 mg at 04/03/18 0911  . methocarbamol (ROBAXIN) tablet 500 mg  500 mg Oral Q6H PRN Marin Olp, PA-C       Or  . methocarbamol (ROBAXIN) 500 mg in dextrose 5 % 50 mL IVPB  500 mg Intravenous Q6H PRN Marin Olp, PA-C      . metoprolol succinate (TOPROL-XL) 24 hr tablet 25 mg  25 mg Oral QHS Marin Olp, PA-C   25 mg at 04/02/18 2117  . ondansetron (ZOFRAN) tablet 4 mg  4 mg Oral Q6H PRN Marin Olp, PA-C       Or  . ondansetron  [facility]) injection 4 mg  4 mg Intravenous Q6H PRN Marin Olp, PA-C      . oxyCODONE (Oxy IR/ROXICODONE) immediate release tablet 10 mg  10 mg Oral Q3H PRN Marin Olp, PA-C   10 mg at 04/02/18 2006  . oxyCODONE (Oxy IR/ROXICODONE) immediate release tablet 5 mg  5 mg Oral Q3H PRN Marin Olp, PA-C   5 mg at 04/03/18 1115  . pantoprazole (PROTONIX) EC tablet 40 mg  40 mg Oral Daily Marin Olp, PA-C   40  mg at 04/03/18 0910  . primidone (MYSOLINE) tablet 150 mg  150 mg Oral QHS Meade Maw, MD   150 mg at 04/02/18 2318  . primidone (MYSOLINE) tablet 250 mg  250 mg Oral q morning - 10a Meade Maw, MD   250 mg at 04/03/18 0911  . QUEtiapine (SEROQUEL) tablet 125 mg  125 mg Oral QHS Marin Olp, PA-C   125 mg at 04/02/18 2317  . senna-docusate (Senokot-S) tablet 1 tablet  1 tablet Oral QHS PRN Marin Olp, PA-C      . simvastatin (ZOCOR) tablet 40 mg  40 mg Oral QHS Marin Olp, PA-C   40 mg at 04/02/18 2115  . sodium chloride flush (NS) 0.9 % injection 3 mL  3 mL Intravenous Q12H Marin Olp, PA-C   3 mL at 04/03/18 0912  . sodium chloride flush (NS) 0.9 % injection 3 mL  3 mL Intravenous PRN Marin Olp, PA-C      . tamsulosin [facility]) capsule 0.4 mg  0.4 mg Oral QPC supper Marin Olp, PA-C   0.4 mg at 04/02/18 1713  . vancomycin (VANCOCIN) 1,500 mg in sodium chloride 0.9 % 500 mL IVPB  1,500 mg Intravenous Q24H Oswald Hillock, RPH 250 mL/hr at 04/03/18 0040 1,500 mg at 04/03/18 0040     Abtx:  Anti-infectives (From admission, onward)   Start     Dose/Rate Route Frequency Ordered Stop   04/03/18 1200  ceFEPIme (MAXIPIME) 2 g in sodium chloride 0.9 % 100 mL IVPB     2 g 200 mL/hr over 30 Minutes Intravenous Every 24 hours 04/02/18 1547     04/02/18 2300  vancomycin (VANCOCIN) 1,500 mg in sodium chloride 0.9 % 500 mL IVPB     1,500 mg 250 mL/hr over 120 Minutes Intravenous Every 24 hours 04/02/18 1547     04/02/18 1700  vancomycin (VANCOCIN) IVPB 1000 mg/200  mL premix     1,000 mg 200 mL/hr over 60 Minutes Intravenous  Once 04/02/18 1547 04/02/18 1812   04/02/18 1515  vancomycin (VANCOCIN) 1,500 mg in sodium chloride 0.9 % 500 mL IVPB  Status:  Discontinued     1,500 mg 250 mL/hr over 120 Minutes Intravenous Every 8 hours 04/02/18 1508 04/02/18 1547   04/02/18 1515  ceFEPIme (MAXIPIME) 1 g in sodium chloride 0.9 % 100 mL IVPB  Status:  Discontinued     1 g 200 mL/hr over 30 Minutes Intravenous Every 8 hours 04/02/18 1508 04/02/18 1547   04/02/18 1234  50,000 units bacitracin in 0.9% normal saline 250 mL irrigation  Status:  Discontinued       As needed 04/02/18 1234 04/02/18 1306   04/02/18 1234  tobramycin (NEBCIN) powder  Status:  Discontinued       As needed 04/02/18 1234 04/02/18 1306   04/02/18 1145  ceFEPIme (MAXIPIME) 2 g in sodium chloride 0.9 % 100 mL IVPB     2 g 200 mL/hr over 30 Minutes Intravenous STAT 04/02/18 1144 04/02/18 1240   04/02/18 0930  vancomycin (VANCOCIN) IVPB 1000 mg/200 mL premix  Status:  Discontinued     1,000 mg 200 mL/hr over 60 Minutes Intravenous  Once 04/02/18 0919 04/02/18 1547   04/02/18 0930  ceFAZolin (ANCEF) IVPB 2g/100 mL premix  Status:  Discontinued     2 g 200 mL/hr over 30 Minutes Intravenous  Once 04/02/18 0919 04/02/18 1547   04/02/18 0912  vancomycin (VANCOCIN) 1-5 GM/200ML-% IVPB    Note to Pharmacy:  Lyman Bishop   :  cabinet override      04/02/18 0912 04/02/18 2129   04/02/18 0912  ceFAZolin (ANCEF) 2-4 GM/100ML-% IVPB    Note to Pharmacy:  Lyman Bishop   : cabinet override      04/02/18 0912 04/02/18 2129      REVIEW OF SYSTEMS:  Const: negative fever, negative chills, negative weight loss Eyes: negative diplopia or visual changes, negative eye pain ENT: negative coryza, negative sore throat Resp: + cough, no hemoptysis, dyspnea Cards: negative for chest pain, palpitations, lower extremity edema GU: negative for frequency, dysuria and hematuria GI: Negative for abdominal  pain, diarrhea, bleeding, constipation Skin: as above Heme: negative for easy bruising and gum/nose bleeding MS: positive  arthralgias, back pain and muscle weakness Neurolo:negative for headaches, dizziness, vertigo, some memory problems  Psych: negative for feelings of anxiety, depression  Endocrine: has polyuria Allergy/Immunology- negative for any medication or food allergies ? Objective:  VITALS:  BP 117/63 (BP Location: Left Arm)   Pulse 84   Temp 97.8 F (36.6 C) (Oral)   Resp 18   Ht 5\' 10"  (1.778 m)   Wt 82.2 kg   SpO2 95%   BMI 26.00 kg/m  PHYSICAL EXAM:  General: Alert, cooperative, no distress, appears stated age.  Head: Normocephalic, without obvious abnormality, atraumatic. Eyes: Conjunctivae clear, anicteric sclerae. Pupils are equal ENT Nares normal. No drainage or sinus tenderness. Lips, mucosa, and tongue normal. No Thrush Neck: Supple, symmetrical, no adenopathy, thyroid: non tender no carotid bruit and no JVD. Back: surgical dressing over thoracic spine and rt buttock not removed Lungs: Clear to auscultation bilaterally. No Wheezing or Rhonchi. No rales. Heart: Regular rate and rhythm, no murmur, rub or gallop. Abdomen: Soft, non-tender,not distended. Bowel sounds normal. No masses Extremities: atraumatic, no cyanosis. No edema. No clubbing Skin: No rashes or lesions. Or bruising Lymph: Cervical, supraclavicular normal. Neurologic: Grossly non-focal Pertinent Labs Lab Results CBC    Component Value Date/Time   WBC 6.4 03/19/2018 1047   RBC 3.50 (L) 03/19/2018 1047   HGB 11.1 (L) 03/19/2018 1047   HCT 32.7 (L) 03/19/2018 1047   PLT 383.0 03/19/2018 1047   MCV 93.5 03/19/2018 1047   MCH 31.9 08/15/2017 1427   MCHC 33.9 03/19/2018 1047   RDW 13.1 03/19/2018 1047   LYMPHSABS 2.2 08/15/2017 1427   MONOABS 0.7 08/15/2017 1427   EOSABS 0.1 08/15/2017 1427   BASOSABS 0.0 08/15/2017 1427    CMP Latest Ref Rng & Units 03/19/2018 09/25/2017 08/15/2017    Glucose 70 - 99 mg/dL 143(H) 135(H) 105(H)  BUN 6 - 23 mg/dL 34(H) 27(H) 30(H)  Creatinine 0.40 - 1.50 mg/dL 1.32 1.20 0.94  Sodium 135 - 145 mEq/L 136 139 138  Potassium 3.5 - 5.1 mEq/L 4.8 4.5 4.4  Chloride 96 - 112 mEq/L 99 101 102  CO2 19 - 32 mEq/L 29 31 24   Calcium 8.4 - 10.5 mg/dL 9.7 9.2 9.2  Total Protein 6.0 - 8.3 g/dL - 6.3 -  Total Bilirubin 0.2 - 1.2 mg/dL - 0.2 -  Alkaline Phos 39 - 117 U/L - 32(L) -  AST 0 - 37 U/L - 20 -  ALT 0 - 53 U/L - 13 -      Microbiology: Recent Results (from the past 240 hour(s))  Aerobic/Anaerobic Culture (surgical/deep wound)     Status: None (Preliminary result)   Collection Time: 04/02/18 12:27 PM  Result Value Ref Range Status   Specimen Description   Final    WOUND A PULSE  GENERATOR AND LEADS CULTURE Performed at [facility], [address] [place], [place] [address]    Special Requests   Final    NONE Performed at [facility], [place], [place] [address]    Gram Stain NO WBC SEEN NO ORGANISMS SEEN   Final   Culture   Final    FEW James Hood MIRABILIS SUSCEPTIBILITIES TO FOLLOW Performed at [facility], [place] [address], [place], [place] [address]    Report Status PENDING  Incomplete   IMAGING RESULTS: ? Impression/Recommendation ?77 y.o. with a history ofHTN, Essential tremors, OA, RT TKA, Degenerative spine disease with sciatica, s/p laminectomy , spine stimulator placement April 2019 for chronic pain with infection at the site was admitted for removal of the device yesterday. I am asked to see patient for antibiotic recommendation. 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 He had L5-S1 laminectomy/discectomy which did not help the pain. He was getting epidural injections with little relief,. Then after a successful trial for spinal cord stimulation he had placement of a permanent spinal cord stimulator on 08/21/17 by James Hood. A paddle lead was placed on the dura at  T10  With advancement of the lead to T8/9 disc space. A pocket was formed at the rt buttock and pulse generator was placed. The buttock site was starting to drain since then and never healed completely. In April he was given augmentin, In May he was given clindamycin  For 3 weeks  In June he was given Trimethoprim/sulfa for 3 weeks . His antibiotics were stopped during his visit on 11/14/17. During his visit on 12/26/17 to see James Hood his note mentions Thoracic spinal incision - healing well without tenderness, drainage, erythema, or fluctuance. Buttock incision significant for superficial dehiscence but no drainage currently. There is minimal erythema. No antibiotics were given.he had intermittent scabbing at the battery site incision Then on 03/27/18 he went to see JamesY with soreness at incision site. There was discharge and during that visit the wound was swabbed for culture and he was given clindamycin. 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  This has been an indolent infection since April with appearance of  healing in between. He had  not taken  any antibiotics since end of June  until Nov 7th when the wound was cultured- So the James Hood may be the only bacteria and no real concern for MRSA or other organisms.  Will give cefepime 2 grams IV q 12 ( adjusted to hs crcl of 55) Will monitor labs while he is on IV  ?DM- last HBa1c as per patient is 7.5 on metformin and linagliptin  HTN on lisionpril, metoprolol  NCI- on memantine,  doenpezil  Anxiety/Depression quetiapine and wellbutrin  Polypharmacy- watch closely for side effects  Advised patient not to keep his dog in bed at night especially with PICC( patient not sure about that)  ? ___________________________________________________ Discussed with patient and his wife in great detail. Discussed  with JamesYarbrough Will follow him as OP

## 2018-04-03 NOTE — Care Management Obs Status (Signed)
[facility] NOTIFICATION   Patient Details  Name: James Hood MRN: [id number] Date of Birth: [birth year]   Medicare Observation Status Notification Given:  Yes    Marshell Garfinkel, RN 04/03/2018, 3:34 PM

## 2018-04-03 NOTE — Care Management Note (Signed)
Case Management Note  Patient Details  Name: James Hood MRN: [id number] Date of Birth: 1940/07/06  Subjective/Objective:                  RNCM met with patient after meeting with ID physician. Patient will need home IV antibiotics.  Patient agrees and states that his wife has experience with" PICC lines". He doesn't have preference from home health agency list provided to patient. He is independent from home without ambulatory assistance.  Action/Plan: Home health list provided.  [facility] to [facility].   Expected Discharge Date:  04/04/18               Expected Discharge Plan:     In-House Referral:     Discharge planning Services  CM Consult  Post Acute Care Choice:  Durable Medical Equipment, Home Health Choice offered to:  Patient  DME Arranged:  IV pump/equipment DME Agency:  [place]:  RN [facility] Agency:  [place]  Status of Service:  In process, will continue to follow  If discussed at Long Length of Stay Meetings, dates discussed:    Additional Comments:  Marshell Garfinkel, RN 04/03/2018, 3:34 PM

## 2018-04-03 NOTE — Progress Notes (Signed)
Peripherally Inserted Central Catheter/Midline Placement  The IV Nurse has discussed with the patient and/or persons authorized to consent for the patient, the purpose of this procedure and the potential benefits and risks involved with this procedure.  The benefits include less needle sticks, lab draws from the catheter, and the patient may be discharged home with the catheter. Risks include, but not limited to, infection, bleeding, blood clot (thrombus formation), and puncture of an artery; nerve damage and irregular heartbeat and possibility to perform a PICC exchange if needed/ordered by physician.  Alternatives to this procedure were also discussed.  B[employer] PICC patient education guide, fact sheet on infection prevention and patient information card has been provided to patient /or left at bedside.    PICC/Midline Placement Documentation  PICC Single Lumen 04/03/18 PICC Right Brachial 43 cm 0 cm (Active)  Indication for Insertion or Continuance of Line Home intravenous therapies (PICC only) 04/03/2018  5:39 PM  Exposed Catheter (cm) 0 cm 04/03/2018  5:39 PM  Site Assessment Clean;Dry;Intact 04/03/2018  5:39 PM  Line Status Flushed;Saline locked;Blood return noted 04/03/2018  5:39 PM  Dressing Type Transparent 04/03/2018  5:39 PM  Dressing Status Clean;Dry;Intact;Antimicrobial disc in place 04/03/2018  5:39 PM  Line Care Connections checked and tightened 04/03/2018  5:39 PM  Line Adjustment (NICU/IV Team Only) No 04/03/2018  5:39 PM  Dressing Intervention New dressing 04/03/2018  5:39 PM  Dressing Change Due 04/10/18 04/03/2018  5:39 PM       Rolena Infante 04/03/2018, 5:40 PM

## 2018-04-04 DIAGNOSIS — T85738A Infection and inflammatory reaction due to other nervous system device, implant or graft, initial encounter: Secondary | ICD-10-CM | POA: Diagnosis not present

## 2018-04-04 LAB — GLUCOSE, CAPILLARY
Glucose-Capillary: 167 mg/dL — ABNORMAL HIGH (ref 70–99)
Glucose-Capillary: 176 mg/dL — ABNORMAL HIGH (ref 70–99)
Glucose-Capillary: 132 mg/dL — ABNORMAL HIGH (ref 70–99)

## 2018-04-04 MED ORDER — CYCLOBENZAPRINE HCL 10 MG PO TABS: 10.0000 mg | ORAL_TABLET | Freq: Three times a day (TID) | ORAL | 2 refills | Status: DC | PRN

## 2018-04-04 MED ORDER — CEFEPIME IV (FOR PTA / DISCHARGE USE ONLY): 2.0000 g | Freq: Two times a day (BID) | INTRAVENOUS | 0 refills | Status: AC

## 2018-04-04 MED ORDER — SODIUM CHLORIDE 0.9 % IV SOLN
1.0000 g | Freq: Once | INTRAVENOUS | Status: DC
  Filled 2018-04-04: qty 1

## 2018-04-04 MED ORDER — ENOXAPARIN SODIUM 40 MG/0.4ML ~~LOC~~ SOLN: 40.0000 mg | Freq: Every day | SUBCUTANEOUS | Status: DC

## 2018-04-04 MED ORDER — OXYCODONE-ACETAMINOPHEN 7.5-325 MG PO TABS: 1.0000 | ORAL_TABLET | Freq: Four times a day (QID) | ORAL | 0 refills | Status: DC | PRN

## 2018-04-04 NOTE — Care Management (Addendum)
Patient is for discharge home today with home IV antibiotic therapy. His wife will be primary caregiver.  It is reported that wife "has had experience with PICC lines."  Wife has been retired for years and will need instruction on administration..  There was discussion regarding teaching the patient but patient has dementia and has short term memory issues.  IV doses are due 10A and 10P. Primary nurse will reach out to ID regarding "the earliest" the 10P dose could be given.  Wife will be on the unit at 4:30p for instruction on administration. [facility] staff will be on the unit to provide the instruction.  If it is determined that the wife would be able to administer the 10P dose at home, patient can discharge. If not, patient will need to receive his 10p dose before can discharge. CM spoke with wife and she verbally confirmed she will be on unit at 4:30p for instruction.  Contacted attending for home health order and face to face

## 2018-04-04 NOTE — Progress Notes (Signed)
Patient is being discharged. Wife at bedside during discharge. PICC line in place and flushes well. HH completed PICC line education and IV ABX. Mesd, scripts, and last dose given all reviewed. Allowed time for questions.

## 2018-04-04 NOTE — Care Management (Signed)
Patient's wife has received instruction by [facility] on the unit for IV antibiotic administration.  Feels comfortable administering tonight's dose.  Medication delivered to patient's room for wife to take home.

## 2018-04-04 NOTE — Discharge Summary (Signed)
Procedure: SCS hardware removal Procedure Date: 04/02/2018 Diagnosis: Infection  History: POD2: He is recovering well. Pain 4/10. Ambulating, voiding, and eating without issue. Drain output 30.  Denies lower extremity pain/numbness/tingling/weakness.   POD1: He continues to do well. Pain 5/10, again mainly in the right buttock. He has ambulated to restroom and voided without issue.  Denies lower extremity pain/numbness/tingling/weakness.  Drain output 15 total.   POD0:  James Hood is POD0 s/p SCS hardware removal due to infection. Previous wound culture positive for proteus mirabilis. He is recovering well. Complains of 6-8/10 right buttock pain. Denies any lower extremity complaints at this time.   Physical Exam: Vitals:   04/04/18 0340 04/04/18 0758  BP: 115/74 128/74  Pulse: 89 79  Resp: 19 18  Temp: 97.9 F (36.6 C) 98.2 F (36.8 C)  SpO2: 94% 96%    AA Ox3 Strength:5/5 throughout lower extremities.  Sensation: intact and symmetric Skin: thoracic incision: no bleeding or drainage. Sutures intact. Dressing removed. Buttock incisions: minimal bleeding at the left of top incision with dressing removal. Suture intact at both incision sites. No purulent drainage.   Drain sites: dressing clean and dry No active bleeding.    Data:  No results for input(s): NA, K, CL, CO2, BUN, CREATININE, LABGLOM, GLUCOSE, CALCIUM in the last 168 hours. No results for input(s): AST, ALT, ALKPHOS in the last 168 hours.  Invalid input(s): TBILI   No results for input(s): WBC, HGB, HCT, PLT in the last 168 hours. Recent Labs  Lab 04/01/18 0839  APTT 35  INR 0.91         Other tests/results: No imaging reviewed  Assessment/Plan:  James Hood is POD2 s/p SCS hardware removal for infection. He has met with ID. PICC line placed, and awaiting home health to meet with patient and wife in regards to at-home antibiotic administration. Drains removed without issue and dressed.  Advised to remove once he gets home. Dressings removed from incisions sites. Dr. Gwenevere Ghazi office number provided and advised to call and make appointment in approximately 2 weeks. He will follow up 11/25 for suture removal and to monitor progress.  ID plan: weekly labs including CBC, CMP, and ESR. Cefepime 2g IV q 12 hours with end date 05/02/2018.    Marin Olp PA-C Department of Neurosurgery

## 2018-04-05 DIAGNOSIS — Z792 Long term (current) use of antibiotics: Secondary | ICD-10-CM | POA: Diagnosis not present

## 2018-04-05 DIAGNOSIS — E119 Type 2 diabetes mellitus without complications: Secondary | ICD-10-CM | POA: Diagnosis not present

## 2018-04-05 DIAGNOSIS — E7800 Pure hypercholesterolemia, unspecified: Secondary | ICD-10-CM | POA: Diagnosis not present

## 2018-04-05 DIAGNOSIS — F17210 Nicotine dependence, cigarettes, uncomplicated: Secondary | ICD-10-CM | POA: Diagnosis not present

## 2018-04-05 DIAGNOSIS — F329 Major depressive disorder, single episode, unspecified: Secondary | ICD-10-CM | POA: Diagnosis not present

## 2018-04-05 DIAGNOSIS — Z7984 Long term (current) use of oral hypoglycemic drugs: Secondary | ICD-10-CM | POA: Diagnosis not present

## 2018-04-05 DIAGNOSIS — F0390 Unspecified dementia without behavioral disturbance: Secondary | ICD-10-CM | POA: Diagnosis not present

## 2018-04-05 DIAGNOSIS — T85733A Infection and inflammatory reaction due to implanted electronic neurostimulator of spinal cord, electrode (lead), initial encounter: Secondary | ICD-10-CM | POA: Diagnosis not present

## 2018-04-05 DIAGNOSIS — M1990 Unspecified osteoarthritis, unspecified site: Secondary | ICD-10-CM | POA: Diagnosis not present

## 2018-04-05 DIAGNOSIS — M961 Postlaminectomy syndrome, not elsewhere classified: Secondary | ICD-10-CM | POA: Diagnosis not present

## 2018-04-05 DIAGNOSIS — Z96651 Presence of right artificial knee joint: Secondary | ICD-10-CM | POA: Diagnosis not present

## 2018-04-05 DIAGNOSIS — G8929 Other chronic pain: Secondary | ICD-10-CM | POA: Diagnosis not present

## 2018-04-05 DIAGNOSIS — Z5181 Encounter for therapeutic drug level monitoring: Secondary | ICD-10-CM | POA: Diagnosis not present

## 2018-04-05 DIAGNOSIS — I10 Essential (primary) hypertension: Secondary | ICD-10-CM | POA: Diagnosis not present

## 2018-04-05 DIAGNOSIS — Z452 Encounter for adjustment and management of vascular access device: Secondary | ICD-10-CM | POA: Diagnosis not present

## 2018-04-05 DIAGNOSIS — L98429 Non-pressure chronic ulcer of back with unspecified severity: Secondary | ICD-10-CM | POA: Diagnosis not present

## 2018-04-05 DIAGNOSIS — M792 Neuralgia and neuritis, unspecified: Secondary | ICD-10-CM | POA: Diagnosis not present

## 2018-04-05 DIAGNOSIS — Z8659 Personal history of other mental and behavioral disorders: Secondary | ICD-10-CM | POA: Diagnosis not present

## 2018-04-05 DIAGNOSIS — F419 Anxiety disorder, unspecified: Secondary | ICD-10-CM | POA: Diagnosis not present

## 2018-04-05 DIAGNOSIS — B964 Proteus (mirabilis) (morganii) as the cause of diseases classified elsewhere: Secondary | ICD-10-CM | POA: Diagnosis not present

## 2018-04-05 DIAGNOSIS — M5442 Lumbago with sciatica, left side: Secondary | ICD-10-CM | POA: Diagnosis not present

## 2018-04-05 DIAGNOSIS — Z9889 Other specified postprocedural states: Secondary | ICD-10-CM | POA: Diagnosis not present

## 2018-04-07 LAB — AEROBIC/ANAEROBIC CULTURE W GRAM STAIN (SURGICAL/DEEP WOUND)

## 2018-04-07 LAB — AEROBIC/ANAEROBIC CULTURE (SURGICAL/DEEP WOUND): Gram Stain: NONE SEEN

## 2018-04-08 DIAGNOSIS — F17210 Nicotine dependence, cigarettes, uncomplicated: Secondary | ICD-10-CM | POA: Diagnosis not present

## 2018-04-08 DIAGNOSIS — E119 Type 2 diabetes mellitus without complications: Secondary | ICD-10-CM | POA: Diagnosis not present

## 2018-04-08 DIAGNOSIS — I10 Essential (primary) hypertension: Secondary | ICD-10-CM | POA: Diagnosis not present

## 2018-04-08 DIAGNOSIS — B964 Proteus (mirabilis) (morganii) as the cause of diseases classified elsewhere: Secondary | ICD-10-CM | POA: Diagnosis not present

## 2018-04-08 DIAGNOSIS — T85733A Infection and inflammatory reaction due to implanted electronic neurostimulator of spinal cord, electrode (lead), initial encounter: Secondary | ICD-10-CM | POA: Diagnosis not present

## 2018-04-08 DIAGNOSIS — E7800 Pure hypercholesterolemia, unspecified: Secondary | ICD-10-CM | POA: Diagnosis not present

## 2018-04-08 DIAGNOSIS — L98429 Non-pressure chronic ulcer of back with unspecified severity: Secondary | ICD-10-CM | POA: Diagnosis not present

## 2018-04-09 ENCOUNTER — Other Ambulatory Visit: Payer: Self-pay | Admitting: Pharmacist

## 2018-04-09 NOTE — Patient Outreach (Signed)
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 He had a spinal cord stimulator placed 08/21/2017 to help with chronic pain, however, an infection developed at the surgical site and it was removed on 04/02/2018. He notes that he is recovering well at home, though is frustrated that his leg pain has returned since removal of the spinal cord stimulator. He denies any other concerns at this time. His wife helps him manage his medications due to his dementia.   He notes that he is interested in reapplying for Januvia patient assistance through [employer] for 2020.  Upon medication review, he does not have a bottle of finasteride and is unsure how long he has been without it.   He notes that he was instructed to hold aspirin 81 mg for 1 week s/p surgery, then resume, and to avoid antiinflammatory agents for 3 months s/p surgery.   Objective: Lab Results  Component Value Date   CREATININE 1.32 03/19/2018   CREATININE 1.20 09/25/2017   CREATININE 0.94 08/15/2017  eGFR 55 mL/min/1.42m; CrCl ~ 50 mL/min using adjusted body weight  Lab Results  Component Value Date   HGBA1C 7.5 (H) 03/19/2018    Lipid Panel     Component Value Date/Time   CHOL 134 09/25/2017 0924   TRIG 127.0 09/25/2017 0924   HDL 50.70 09/25/2017 0924   CHOLHDL 3 09/25/2017 0924   VLDL 25.4 09/25/2017 0924   LDLCALC 57 09/25/2017 0924   LDLDIRECT 64.0 03/19/2018 1047    BP Readings from Last 3 Encounters:  04/04/18 132/83  03/19/18 98/64  03/04/18 116/68    No Known  Allergies  Medications Reviewed Today    Reviewed by [facility]De [facility] [employer](Pharmacist) on 04/09/18 at 0925  Med List Status: <None>  Medication Order Taking? Sig Documenting Provider Last Dose Status Informant  aspirin EC 81 MG tablet [id number] Take 81 mg by mouth daily. [provider] Not Taking Active            Med Note (Darnelle Maffucci CArville Lime  Wed Apr 09, 2018  9:25 AM) Per pt, recommended to hold aspirin x1 week s/p surgery then resume  buPROPion ([facility]) 150 MG 12 hr tablet [id number] TAKE ONE TABLET BY MOUTH TWICE A DAY SLeone Haven MD Taking Active Self  ceFEPime (MAXIPIME) IVPB [id number] Inject 2 g into the vein every 12 (twelve) hours. Indication:  Spinal device infection Last Day of Therapy:  05/02/18 Labs - Once weekly:  CBC/D, CMP and ESR Please pull PIC at completion of IV antibiotics. FMarin Olp PA-C Taking Active   cyclobenzaprine (FLEXERIL) 10 MG tablet [id number] Take 1 tablet (10 mg total) by mouth 3 (three) times daily as needed for muscle spasms. FMarin Olp PA-C Taking Active   diphenoxylate-atropine (LOMOTIL) 2.5-0.025 MG tablet [id number] Take 1 tablet by mouth 2 (two) times daily.  [provider] Taking Active Self  divalproex (DEPAKOTE ER) 500 MG 24 hr tablet [id number] Take 1 tablet by mouth 2 (two) times daily. [provider] Taking Active   donepezil (ARICEPT) 10 MG tablet [id number] Take 10 mg by mouth  at bedtime. [provider] Taking Active Self  fenofibrate (TRICOR) 145 MG tablet [id number] Yes TAKE ONE TABLET BY MOUTH DAILY Crecencio Mc, MD Taking Active Self  finasteride (PROSCAR) 5 MG tablet [id number] No Take 1 tablet (5 mg total) by mouth daily.  Patient not taking:  Reported on 03/28/2018   Nickie Retort, MD Not Taking Active Self  gabapentin (NEURONTIN) 600 MG tablet [id number] Yes TAKE TWO TABLETS BY MOUTH THREE TIMES A DAY Leone Haven, MD Taking Active Self   lisinopril-hydrochlorothiazide (PRINZIDE,ZESTORETIC) 20-25 MG tablet [id number] Yes TAKE ONE TABLET BY MOUTH TWICE A DAY Leone Haven, MD Taking Active Self  Melatonin 10 MG TABS [id number] Yes Take 20 mg by mouth at bedtime. [provider] Taking Active Self  memantine (NAMENDA) 10 MG tablet [id number] Yes Take 10 mg by mouth 2 (two) times daily.  [provider] Taking Active Self           Med Note Maylene Roes Jul 18, 2016  1:40 PM)    metFORMIN (GLUCOPHAGE) 500 MG tablet [id number] Yes TAKE TWO TABLETS BY MOUTH TWICE A DAY WITH A MEAL Leone Haven, MD Taking Active Self  metoprolol succinate (TOPROL-XL) 25 MG 24 hr tablet [id number] Yes Take 25 mg by mouth at bedtime.  [provider] Taking Active Self           Med Note Sharlett Iles, Jae Dire Jul 18, 2016  1:40 PM)    Multiple Vitamins-Minerals (CENTRUM SILVER PO) [id number] Yes Take 1 tablet by mouth daily. [provider] Taking Active   omeprazole (PRILOSEC) 20 MG capsule [id number] Yes Take 20 mg by mouth 2 (two) times daily before a meal.  [provider] Taking Active Self           Med Note Josiah Lobo, KIMBERLY   Thu Aug 09, 2016 12:05 PM)    oxyCODONE-acetaminophen (PERCOCET) 7.5-325 MG tablet [id number] Yes Take 1 tablet by mouth every 6 (six) hours as needed for up to 7 days for moderate pain or severe pain. Marin Olp, PA-C Taking Active   primidone (MYSOLINE) 50 MG tablet [id number] Yes Take 150-250 mg by mouth 2 (two) times daily. Take 250 mg by mouth in the morning & take 150 mg at night. [provider] Taking Active Self  QUEtiapine (SEROQUEL) 25 MG tablet [id number] Yes Take 250 mg by mouth at bedtime.  [provider] Taking Active Self  simvastatin (ZOCOR) 40 MG tablet [id number] Yes TAKE 1 TABLET (40 MG TOTAL) BY MOUTH DAILY. Leone Haven, MD Taking Active Self  sitaGLIPtin (JANUVIA) 100 MG tablet [id number] Yes Take 1 tablet (100  mg total) by mouth daily. Leone Haven, MD Taking Active Self  tamsulosin (FLOMAX) 0.4 MG CAPS capsule [id number] Yes TAKE ONE CAPSULE BY MOUTH DAILY AFTER SUPPER Leone Haven, MD Taking Active Self  Med List Note Dewayne Shorter, RN 01/14/18 [id number]): MR 04-19-18          Assessment:  Date Discharged from Hospital: 04/04/2018 Date Medication Reconciliation Performed: 04/09/2018  Medications Discontinued at Discharge:   Aspirin (patient instructed to hold x1 week)  Clindamycin (course completed)  New Medications at Discharge:   Cefepime  Patient was recently discharged from hospital and all medications have been reviewed.  Drugs sorted by system:  Neurologic/Psychologic: bupropion, divalproex, donepezil, memantine, primidone, quetiapine  Cardiovascular: aspirin (holding), fenofibrate, lisinopril-HCTZ, metoprolol succinate, simvastatin  Pulmonary/Allergy:   Gastrointestinal: Lomotil, omeprazole  Endocrine: metformin, Januvia  Pain: gabapentin, cyclobenzaprine, oxycodone-acetaminophen  Infectious Diseases: cefepime  Genitourinary: finasteride (has not been taking), tamsulosin  Vitamins/Minerals/Supplements: melatonin, multivitamin  Medication Review Findings:  . Finasteride- contacted patient's pharmacy to ask to refill finasteride; a new prescription is needed . Drug-Drug interactions: primidone may increase metabolism of, therefore decrease concentrations of, quetiapine, simvastatin, and tamsulosin. Neurology is managing quetiapine and titrating to response; simvastatin is currently treating LDL to stringent goal of <70 mg/dL; If, with restarting finasteride, patient is still having genitourinary problems related to BPH, could consider increasing tamsulosin to 0.8 mg daily.  Medication Assistance Findings:  - Patient has been approved and receiving Januvia through [employer] patient assistance program through 05/20/2018. He is interested in reapplying for  2020.  Plan: - Will forward note to primary care provider Dr. Caryl Bis to finasteride refill - Will forward note to Susy Frizzle, CPhT for preparation of [employer] 2020 patient assistance application.  - Patient and wife have my contact information and can reach out with any future questions or concerns  Catie Darnelle Maffucci, PharmD PGY2 Ambulatory Care Pharmacy Resident, [place] Phone: [phone]

## 2018-04-10 ENCOUNTER — Encounter: Payer: Self-pay | Admitting: Pharmacy Technician

## 2018-04-10 ENCOUNTER — Telehealth: Payer: Self-pay

## 2018-04-10 ENCOUNTER — Other Ambulatory Visit: Payer: Self-pay | Admitting: Family Medicine

## 2018-04-10 ENCOUNTER — Other Ambulatory Visit: Payer: Self-pay | Admitting: Pharmacy Technician

## 2018-04-10 MED ORDER — SIMVASTATIN 40 MG PO TABS: 40.0000 mg | ORAL_TABLET | Freq: Every day | ORAL | 0 refills | Status: DC

## 2018-04-10 MED ORDER — FINASTERIDE 5 MG PO TABS: 5.0000 mg | ORAL_TABLET | Freq: Every day | ORAL | 2 refills | Status: DC

## 2018-04-10 NOTE — Patient Outreach (Signed)
[facility]) Care Management  04/10/2018  KARTER HELLMER 21-Jul-1940 [id number]    Received [employer] patient assistance referral for 2020 from [place] for [place].  Prepared patient portion to be mailed. Provider portion given to [place] to take with her to clinic.  Will followup with patient in 7-10 business days to confirm receipt of application.  Emony Dormer P. Katelyne Galster, [place] Management [phone]

## 2018-04-10 NOTE — Telephone Encounter (Signed)
Error

## 2018-04-15 ENCOUNTER — Ambulatory Visit: Payer: PPO | Attending: Infectious Diseases | Admitting: Infectious Diseases

## 2018-04-15 ENCOUNTER — Ambulatory Visit
Payer: PPO | Attending: Student in an Organized Health Care Education/Training Program | Admitting: Student in an Organized Health Care Education/Training Program

## 2018-04-15 ENCOUNTER — Encounter: Payer: Self-pay | Admitting: Infectious Diseases

## 2018-04-15 ENCOUNTER — Encounter: Payer: Self-pay | Admitting: Student in an Organized Health Care Education/Training Program

## 2018-04-15 ENCOUNTER — Other Ambulatory Visit: Payer: Self-pay

## 2018-04-15 VITALS — BP 115/67 | HR 95 | Temp 97.7°F | Resp 18 | Ht 71.0 in | Wt 180.0 lb

## 2018-04-15 VITALS — BP 107/73 | HR 109 | Temp 97.4°F | Wt 180.0 lb

## 2018-04-15 DIAGNOSIS — Z87891 Personal history of nicotine dependence: Secondary | ICD-10-CM | POA: Diagnosis not present

## 2018-04-15 DIAGNOSIS — M79605 Pain in left leg: Secondary | ICD-10-CM | POA: Insufficient documentation

## 2018-04-15 DIAGNOSIS — F329 Major depressive disorder, single episode, unspecified: Secondary | ICD-10-CM | POA: Insufficient documentation

## 2018-04-15 DIAGNOSIS — G894 Chronic pain syndrome: Secondary | ICD-10-CM | POA: Diagnosis not present

## 2018-04-15 DIAGNOSIS — E785 Hyperlipidemia, unspecified: Secondary | ICD-10-CM | POA: Diagnosis not present

## 2018-04-15 DIAGNOSIS — M5117 Intervertebral disc disorders with radiculopathy, lumbosacral region: Secondary | ICD-10-CM | POA: Diagnosis not present

## 2018-04-15 DIAGNOSIS — Z8719 Personal history of other diseases of the digestive system: Secondary | ICD-10-CM | POA: Insufficient documentation

## 2018-04-15 DIAGNOSIS — F419 Anxiety disorder, unspecified: Secondary | ICD-10-CM

## 2018-04-15 DIAGNOSIS — Z86010 Personal history of colonic polyps: Secondary | ICD-10-CM | POA: Insufficient documentation

## 2018-04-15 DIAGNOSIS — M961 Postlaminectomy syndrome, not elsewhere classified: Secondary | ICD-10-CM

## 2018-04-15 DIAGNOSIS — Z79891 Long term (current) use of opiate analgesic: Secondary | ICD-10-CM | POA: Insufficient documentation

## 2018-04-15 DIAGNOSIS — Z5181 Encounter for therapeutic drug level monitoring: Secondary | ICD-10-CM | POA: Insufficient documentation

## 2018-04-15 DIAGNOSIS — G8929 Other chronic pain: Secondary | ICD-10-CM

## 2018-04-15 DIAGNOSIS — G250 Essential tremor: Secondary | ICD-10-CM

## 2018-04-15 DIAGNOSIS — F339 Major depressive disorder, recurrent, unspecified: Secondary | ICD-10-CM | POA: Diagnosis not present

## 2018-04-15 DIAGNOSIS — M5417 Radiculopathy, lumbosacral region: Secondary | ICD-10-CM

## 2018-04-15 DIAGNOSIS — K219 Gastro-esophageal reflux disease without esophagitis: Secondary | ICD-10-CM | POA: Diagnosis not present

## 2018-04-15 DIAGNOSIS — Z7982 Long term (current) use of aspirin: Secondary | ICD-10-CM | POA: Insufficient documentation

## 2018-04-15 DIAGNOSIS — F1190 Opioid use, unspecified, uncomplicated: Secondary | ICD-10-CM

## 2018-04-15 DIAGNOSIS — M545 Low back pain: Secondary | ICD-10-CM | POA: Diagnosis not present

## 2018-04-15 DIAGNOSIS — Z981 Arthrodesis status: Secondary | ICD-10-CM | POA: Diagnosis not present

## 2018-04-15 DIAGNOSIS — N400 Enlarged prostate without lower urinary tract symptoms: Secondary | ICD-10-CM | POA: Diagnosis not present

## 2018-04-15 DIAGNOSIS — M1990 Unspecified osteoarthritis, unspecified site: Secondary | ICD-10-CM | POA: Diagnosis not present

## 2018-04-15 DIAGNOSIS — Z79899 Other long term (current) drug therapy: Secondary | ICD-10-CM

## 2018-04-15 DIAGNOSIS — M7062 Trochanteric bursitis, left hip: Secondary | ICD-10-CM | POA: Diagnosis not present

## 2018-04-15 DIAGNOSIS — T85733A Infection and inflammatory reaction due to implanted electronic neurostimulator of spinal cord, electrode (lead), initial encounter: Secondary | ICD-10-CM | POA: Diagnosis not present

## 2018-04-15 DIAGNOSIS — I10 Essential (primary) hypertension: Secondary | ICD-10-CM

## 2018-04-15 DIAGNOSIS — Z96651 Presence of right artificial knee joint: Secondary | ICD-10-CM | POA: Insufficient documentation

## 2018-04-15 DIAGNOSIS — T85733D Infection and inflammatory reaction due to implanted electronic neurostimulator of spinal cord, electrode (lead), subsequent encounter: Secondary | ICD-10-CM

## 2018-04-15 DIAGNOSIS — E7800 Pure hypercholesterolemia, unspecified: Secondary | ICD-10-CM | POA: Insufficient documentation

## 2018-04-15 DIAGNOSIS — K2270 Barrett's esophagus without dysplasia: Secondary | ICD-10-CM | POA: Insufficient documentation

## 2018-04-15 DIAGNOSIS — R51 Headache: Secondary | ICD-10-CM | POA: Insufficient documentation

## 2018-04-15 DIAGNOSIS — M19072 Primary osteoarthritis, left ankle and foot: Secondary | ICD-10-CM | POA: Insufficient documentation

## 2018-04-15 DIAGNOSIS — F0390 Unspecified dementia without behavioral disturbance: Secondary | ICD-10-CM | POA: Insufficient documentation

## 2018-04-15 DIAGNOSIS — Z7984 Long term (current) use of oral hypoglycemic drugs: Secondary | ICD-10-CM

## 2018-04-15 DIAGNOSIS — M5442 Lumbago with sciatica, left side: Secondary | ICD-10-CM

## 2018-04-15 DIAGNOSIS — E119 Type 2 diabetes mellitus without complications: Secondary | ICD-10-CM | POA: Diagnosis not present

## 2018-04-15 DIAGNOSIS — R251 Tremor, unspecified: Secondary | ICD-10-CM | POA: Insufficient documentation

## 2018-04-15 DIAGNOSIS — Z95828 Presence of other vascular implants and grafts: Secondary | ICD-10-CM | POA: Diagnosis not present

## 2018-04-15 DIAGNOSIS — Z9889 Other specified postprocedural states: Secondary | ICD-10-CM | POA: Insufficient documentation

## 2018-04-15 DIAGNOSIS — T85738D Infection and inflammatory reaction due to other nervous system device, implant or graft, subsequent encounter: Secondary | ICD-10-CM

## 2018-04-15 DIAGNOSIS — Z8249 Family history of ischemic heart disease and other diseases of the circulatory system: Secondary | ICD-10-CM | POA: Insufficient documentation

## 2018-04-15 DIAGNOSIS — K449 Diaphragmatic hernia without obstruction or gangrene: Secondary | ICD-10-CM | POA: Insufficient documentation

## 2018-04-15 MED ORDER — OXYCODONE-ACETAMINOPHEN 10-325 MG PO TABS: 1.0000 | ORAL_TABLET | Freq: Four times a day (QID) | ORAL | 0 refills | Status: DC | PRN

## 2018-04-15 NOTE — Patient Instructions (Signed)
Oxycodone with percocet to last until 06/22/2018 has been escribed to your pharmacy.

## 2018-04-15 NOTE — Progress Notes (Signed)
Nursing Pain Medication Assessment:  Safety precautions to be maintained throughout the outpatient stay will include: orient to surroundings, keep bed in low position, maintain call bell within reach at all times, provide assistance with transfer out of bed and ambulation.  Medication Inspection Compliance: Pill count conducted under aseptic conditions, in front of the patient. Neither the pills nor the bottle was removed from the patient's sight at any time. Once count was completed pills were immediately returned to the patient in their original bottle.  Medication: Oxycodone/APAP Pill/Patch Count: 38 of 100 pills remain Pill/Patch Appearance: Markings consistent with prescribed medication Bottle Appearance: Standard pharmacy container. Clearly labeled. Filled Date: 33 / 9 / 2019 Last Medication intake:  Today   Wife states there are 24 additional pills at home in pill counter. Pt and wife advised to bring all pills to appts.

## 2018-04-15 NOTE — Progress Notes (Signed)
Patient's Name: EMMANUELLE COXE  MRN: [id number]  Referring Provider: Leone Haven, MD  DOB: [birth year]  PCP: Leone Haven, MD  DOS: 04/15/2018  Note by: [name], MD  Service setting: Ambulatory outpatient  Specialty: Interventional Pain Management  Location: [facility] (AMB) Pain Management Facility    Patient type: Established   Primary Reason(s) for Visit: Encounter for prescription drug management. (Level of risk: moderate)  CC: Back Pain (lower) and Leg Pain (left)  HPI  Mr. Garris is a 77 y.o. year old, male patient, who comes today for a medication management evaluation. He has Hypertension; Tremor, essential; Primary localized osteoarthritis of right knee; Anxiety and depression; DJD (degenerative joint disease) of knee; Trochanteric bursitis of left hip; Headache; Falls; Barrett's esophagus; Chronic lumbar pain; Benign prostatic hyperplasia; Benign essential tremor; Benign neoplasm of colon; Chronic diarrhea; Type 2 diabetes mellitus ([place]); Difficulty in walking; HLD (hyperlipidemia); Amnesia; Testicular hypofunction; Absence of sensation; Episode of syncope; Hypertriglyceridemia; Anemia; Lumbar herniated disc; Night sweats; Right hip pain; Purpura ([place]); Thyroid nodule; Fatty liver; S/P insertion of spinal cord stimulator; Chronic pain syndrome; Failed back surgical syndrome; Lumbosacral radiculopathy; Primary osteoarthritis of left ankle; Facet arthritis of lumbar region; Upper respiratory infection; and Wound infection complicating hardware ([place]) on their problem list. His primarily concern today is the Back Pain (lower) and Leg Pain (left)  Pain Assessment: Location: Lower Back Radiating: to left ankle Onset: More than a month ago Duration: Chronic pain Quality: Aching, Constant Severity: 4 /10 (subjective, self-reported pain score)  Note: Reported level is compatible with observation.                         When using our objective Pain Scale, levels between 6 and  10/10 are said to belong in an emergency room, as it progressively worsens from a 6/10, described as severely limiting, requiring emergency care not usually available at an outpatient pain management facility. At a 6/10 level, communication becomes difficult and requires great effort. Assistance to reach the emergency department may be required. Facial flushing and profuse sweating along with potentially dangerous increases in heart rate and blood pressure will be evident. Effect on ADL: limits daily activities Timing: Constant Modifying factors: rest, staying off leg makes pain tolerable BP: 115/67  HR: 95  Mr. Riviello was last scheduled for an appointment on 01/14/2018 for medication management. During today's appointment we reviewed Mr. Space's chronic pain status, as well as his outpatient medication regimen.  The patient  reports that he does not use drugs. His body mass index is 25.1 kg/m.  Further details on both, my assessment(s), as well as the proposed treatment plan, please see below.  Controlled Substance Pharmacotherapy Assessment REMS (Risk Evaluation and Mitigation Strategy)  Analgesic: Percocet 7.5 mg 4 times daily as needed, quantity 100/month MME/day: 35-40 mg/day.  [name], RN  04/15/2018  8:52 AM  Sign at close encounter Nursing Pain Medication Assessment:  Safety precautions to be maintained throughout the outpatient stay will include: orient to surroundings, keep bed in low position, maintain call bell within reach at all times, provide assistance with transfer out of bed and ambulation.  Medication Inspection Compliance: Pill count conducted under aseptic conditions, in front of the patient. Neither the pills nor the bottle was removed from the patient's sight at any time. Once count was completed pills were immediately returned to the patient in their original bottle.  Medication: Oxycodone/APAP Pill/Patch Count: 38 of 100 pills remain Pill/Patch Appearance:  Markings consistent with prescribed medication Bottle Appearance: Standard pharmacy container. Clearly labeled. Filled Date: 55 / 9 / 2019 Last Medication intake:  Today   Wife states there are 24 additional pills at home in pill counter. Pt and wife advised to bring all pills to appts.   Pharmacokinetics: Liberation and absorption (onset of action): WNL Distribution (time to peak effect): WNL Metabolism and excretion (duration of action): WNL         Pharmacodynamics: Desired effects: Analgesia: Mr. Deike reports <50% benefit. Functional ability: Patient reports that medication allows him to accomplish basic ADLs Clinically meaningful improvement in function (CMIF): Sustained CMIF goals met Perceived effectiveness: Described as relatively effective but with some room for improvement Undesirable effects: Side-effects or Adverse reactions: None reported Monitoring: [place] PMP: Online review of the past 29-monthperiod conducted. Compliant with practice rules and regulations Last UDS on record: No results found for: SUMMARY UDS interpretation: Compliant          Medication Assessment Form: Reviewed. Patient indicates being compliant with therapy Treatment compliance: Compliant Risk Assessment Profile: Aberrant behavior: See prior evaluations. None observed or detected today Comorbid factors increasing risk of overdose: See prior notes. No additional risks detected today Opioid risk tool (ORT) (Total Score): 3 Personal History of Substance Abuse (SUD-Substance use disorder):  Alcohol: Positive Male or Male  Illegal Drugs: Negative  Rx Drugs: Negative  ORT Risk Level calculation: Low Risk Risk of substance use disorder (SUD): Low Opioid Risk Tool - 04/15/18 0853      Family History of Substance Abuse   Alcohol  Negative    Illegal Drugs  Negative    Rx Drugs  Negative      Personal History of Substance Abuse   Alcohol  Positive Male or Male    Illegal Drugs  Negative     Rx Drugs  Negative      Age   Age between 193-45years   No      History of Preadolescent Sexual Abuse   History of Preadolescent Sexual Abuse  Negative or Male      Psychological Disease   Psychological Disease  Negative    Depression  Negative      Total Score   Opioid Risk Tool Scoring  3    Opioid Risk Interpretation  Low Risk      ORT Scoring interpretation table:  Score <3 = Low Risk for SUD  Score between 4-7 = Moderate Risk for SUD  Score >8 = High Risk for Opioid Abuse   Risk Mitigation Strategies:  Patient Counseling: Covered Patient-Prescriber Agreement (PPA): Present and active  Notification to other healthcare providers: Done  Pharmacologic Plan: Increase to Percocet 10 mg 4 times daily as needed given interval removal of spinal cord stimulator implant secondary to infection which has resulted in worsening pain.             Laboratory Chemistry  Inflammation Markers (CRP: Acute Phase) (ESR: Chronic Phase) Lab Results  Component Value Date   ESRSEDRATE 11 01/29/2017                         Rheumatology Markers No results found for: RF, ANA, LABURIC, URICUR, LYMEIGGIGMAB, LYMEABIGMQN, HLAB27                      Renal Function Markers Lab Results  Component Value Date   BUN 34 (H) 03/19/2018   CREATININE 1.32 03/19/2018   GFRAA >  60 08/15/2017   GFRNONAA >60 08/15/2017                             Hepatic Function Markers Lab Results  Component Value Date   AST 20 09/25/2017   ALT 13 09/25/2017   ALBUMIN 3.9 09/25/2017   ALKPHOS 32 (L) 09/25/2017   AMMONIA 14 08/09/2016                        Electrolytes Lab Results  Component Value Date   NA 136 03/19/2018   K 4.8 03/19/2018   CL 99 03/19/2018   CALCIUM 9.7 03/19/2018                        Neuropathy Markers Lab Results  Component Value Date   VITAMINB12 433 03/19/2018   FOLATE 13.3 03/19/2018   HGBA1C 7.5 (H) 03/19/2018   HIV NONREACTIVE 06/19/2016                        CNS  Tests No results found for: COLORCSF, APPEARCSF, RBCCOUNTCSF, WBCCSF, POLYSCSF, LYMPHSCSF, EOSCSF, PROTEINCSF, GLUCCSF, JCVIRUS, CSFOLI, IGGCSF                      Bone Pathology Markers No results found for: VD25OH, VD125OH2TOT, [id number], [id number], 25OHVITD1, 25OHVITD2, 25OHVITD3, TESTOFREE, TESTOSTERONE                       Coagulation Parameters Lab Results  Component Value Date   INR 0.91 04/01/2018   LABPROT 12.2 04/01/2018   APTT 35 04/01/2018   PLT 383.0 03/19/2018                        Cardiovascular Markers Lab Results  Component Value Date   CKTOTAL 468 (H) 08/14/2016   HGB 11.1 (L) 03/19/2018   HCT 32.7 (L) 03/19/2018                         CA Markers No results found for: CEA, CA125, LABCA2                      Note: Lab results reviewed.  Recent Diagnostic Imaging Results  [place] EKG SITE RITE If Site Rite image not attached, placement could not be confirmed due to  current cardiac rhythm.  Complexity Note: Imaging results reviewed. Results shared with Mr. Washabaugh, using Layman's terms.                         Meds   Current Outpatient Medications:  .  aspirin EC 81 MG tablet, Take 81 mg by mouth daily., Disp: , Rfl:  .  buPROPion (WELLBUTRIN SR) 150 MG 12 hr tablet, TAKE ONE TABLET BY MOUTH TWICE A DAY, Disp: 180 tablet, Rfl: 0 .  ceFEPime (MAXIPIME) IVPB, Inject 2 g into the vein every 12 (twelve) hours. Indication:  Spinal device infection Last Day of Therapy:  05/02/18 Labs - Once weekly:  CBC/D, CMP and ESR Please pull PIC at completion of IV antibiotics., Disp: 58 Units, Rfl: 0 .  cyclobenzaprine (FLEXERIL) 10 MG tablet, Take 1 tablet (10 mg total) by mouth 3 (three) times daily as needed for muscle spasms., Disp: 60 tablet, Rfl: 2 .  diphenoxylate-atropine (  LOMOTIL) 2.5-0.025 MG tablet, Take 1 tablet by mouth 2 (two) times daily. , Disp: , Rfl:  .  donepezil (ARICEPT) 10 MG tablet, Take 10 mg by mouth at bedtime., Disp: , Rfl:  .  fenofibrate  (TRICOR) 145 MG tablet, TAKE ONE TABLET BY MOUTH DAILY, Disp: 90 tablet, Rfl: 0 .  finasteride (PROSCAR) 5 MG tablet, Take 1 tablet (5 mg total) by mouth daily., Disp: 30 tablet, Rfl: 2 .  gabapentin (NEURONTIN) 600 MG tablet, TAKE TWO TABLETS BY MOUTH THREE TIMES A DAY, Disp: 180 tablet, Rfl: 2 .  lisinopril-hydrochlorothiazide (PRINZIDE,ZESTORETIC) 20-25 MG tablet, TAKE ONE TABLET BY MOUTH TWICE A DAY, Disp: 180 tablet, Rfl: 0 .  Melatonin 10 MG TABS, Take 20 mg by mouth at bedtime., Disp: , Rfl:  .  memantine (NAMENDA) 10 MG tablet, Take 10 mg by mouth 2 (two) times daily. , Disp: , Rfl:  .  metFORMIN (GLUCOPHAGE) 500 MG tablet, TAKE TWO TABLETS BY MOUTH TWICE A DAY WITH A MEAL, Disp: 360 tablet, Rfl: 1 .  metoprolol succinate (TOPROL-XL) 25 MG 24 hr tablet, Take 25 mg by mouth at bedtime. , Disp: , Rfl:  .  Multiple Vitamins-Minerals (CENTRUM SILVER PO), Take 1 tablet by mouth daily., Disp: , Rfl:  .  omeprazole (PRILOSEC) 20 MG capsule, Take 20 mg by mouth 2 (two) times daily before a meal. , Disp: , Rfl:  .  primidone (MYSOLINE) 50 MG tablet, Take 150-250 mg by mouth 2 (two) times daily. Take 250 mg by mouth in the morning & take 150 mg at night., Disp: , Rfl:  .  QUEtiapine (SEROQUEL) 25 MG tablet, Take 250 mg by mouth at bedtime. , Disp: , Rfl:  .  simvastatin (ZOCOR) 40 MG tablet, Take 1 tablet (40 mg total) by mouth daily., Disp: 90 tablet, Rfl: 0 .  sitaGLIPtin (JANUVIA) 100 MG tablet, Take 1 tablet (100 mg total) by mouth daily., Disp: 30 tablet, Rfl: 2 .  tamsulosin (FLOMAX) 0.4 MG CAPS capsule, TAKE ONE CAPSULE BY MOUTH DAILY AFTER SUPPER, Disp: 90 capsule, Rfl: 2 .  divalproex (DEPAKOTE ER) 500 MG 24 hr tablet, Take 1 tablet by mouth 2 (two) times daily., Disp: , Rfl:  .  [START ON 04/23/2018] oxyCODONE-acetaminophen (PERCOCET) 10-325 MG tablet, Take 1 tablet by mouth every 6 (six) hours as needed for pain., Disp: 120 tablet, Rfl: 0 .  [START ON 05/23/2018] oxyCODONE-acetaminophen  (PERCOCET) 10-325 MG tablet, Take 1 tablet by mouth every 6 (six) hours as needed for pain., Disp: 120 tablet, Rfl: 0  ROS  Constitutional: Denies any fever or chills Gastrointestinal: No reported hemesis, hematochezia, vomiting, or acute GI distress Musculoskeletal: Denies any acute onset joint swelling, redness, loss of ROM, or weakness Neurological: No reported episodes of acute onset apraxia, aphasia, dysarthria, agnosia, amnesia, paralysis, loss of coordination, or loss of consciousness  Allergies  Mr. Huhn has No Known Allergies.  [place]  Drug: Mr. Gallien  reports that he does not use drugs. Alcohol:  reports that he drank alcohol. Tobacco:  reports that he quit smoking 7 days ago. His smoking use included cigarettes. He has a 30.00 pack-year smoking history. He has never used smokeless tobacco. 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 Surgical: Mr. Flater  has a past surgical history that includes esophageal stretch; Tonsillectomy; Total knee arthroplasty (Right, 11/15/2014); Lumbar laminectomy/decompression microdiscectomy (Left, 02/03/2016); Colonoscopy with propofol (N/A, 09/14/2016); Joint replacement (Right, 2016); Pulse generator implant (N/A, 08/21/2017); Back surgery; and Pulse generator implant (Right, 04/02/2018). Family: family history includes Depression in his father; Heart attack in his mother; Heart disease in his mother; Hypertension in his father.  Constitutional Exam  General appearance:  Well nourished, well developed, and well hydrated. In no apparent acute distress Vitals:   04/15/18 0836  BP: 115/67  Pulse: 95  Resp: 18  Temp: 97.7 F (36.5 C)  TempSrc: Oral  SpO2: 93%  Weight: 180 lb (81.6 kg)  Height: 5' 11"  (1.803 m)   BMI Assessment: Estimated body mass index is 25.1 kg/m as calculated from the following:   Height as of this encounter: 5' 11"  (1.803 m).   Weight as of this encounter: 180 lb (81.6 kg).  BMI interpretation table: BMI level Category Range association with higher incidence of chronic pain  <18 kg/m2 Underweight   18.5-24.9 kg/m2 Ideal body weight   25-29.9 kg/m2 Overweight Increased incidence by 20%  30-34.9 kg/m2 Obese (Class I) Increased incidence by 68%  35-39.9 kg/m2 Severe obesity (Class II) Increased incidence by 136%  >40 kg/m2 Extreme obesity (Class III) Increased incidence by 254%   Patient's current BMI Ideal Body weight  Body mass index is 25.1 kg/m. Ideal body weight: 75.3 kg (166 lb 0.1 oz) Adjusted ideal body weight: 77.8 kg (171 lb 9.7 oz)   BMI Readings from Last 4 Encounters:  04/15/18 25.10 kg/m  04/02/18 26.00 kg/m  03/31/18 25.83 kg/m  03/19/18 26.20 kg/m   Wt Readings from Last 4 Encounters:  04/15/18 180 lb (81.6 kg)  04/02/18 181 lb 3.5 oz (82.2 kg)  03/31/18 180 lb (81.6 kg)  03/19/18 182 lb 9.6 oz (82.8 kg)  Psych/Mental status: Alert, oriented x 3 (person, place, & time)       Eyes: PERLA Respiratory: No evidence of acute respiratory distress  Cervical Spine Area Exam  Skin & Axial Inspection: No masses, redness, edema, swelling, or associated skin lesions Alignment: Symmetrical Functional ROM: Unrestricted ROM      Stability: No instability detected Muscle Tone/Strength: Functionally intact. No obvious neuro-muscular anomalies detected. Sensory (Neurological): Unimpaired Palpation: No palpable anomalies              Upper Extremity (UE) Exam    Side: Right upper extremity  Side: Left upper  extremity  Skin & Extremity Inspection: Skin color, temperature, and hair growth are WNL. No peripheral edema or cyanosis. No masses, redness, swelling, asymmetry, or associated skin lesions. No contractures.  Skin & Extremity Inspection: Skin color, temperature, and hair growth are WNL. No peripheral edema or cyanosis. No masses, redness, swelling, asymmetry, or associated skin lesions. No contractures.  Functional ROM: Unrestricted ROM          Functional ROM: Unrestricted ROM          Muscle Tone/Strength: Functionally intact. No obvious neuro-muscular anomalies detected.  Muscle Tone/Strength: Functionally intact. No obvious neuro-muscular anomalies detected.  Sensory (Neurological): Unimpaired          Sensory (Neurological): Unimpaired          Palpation: No palpable anomalies              Palpation: No palpable anomalies  Provocative Test(s):  Phalen's test: deferred Tinel's test: deferred Apley's scratch test (touch opposite shoulder):  Action 1 (Across chest): deferred Action 2 (Overhead): deferred Action 3 (LB reach): deferred   Provocative Test(s):  Phalen's test: deferred Tinel's test: deferred Apley's scratch test (touch opposite shoulder):  Action 1 (Across chest): deferred Action 2 (Overhead): deferred Action 3 (LB reach): deferred    Thoracic Spine Area Exam  Skin & Axial Inspection: No masses, redness, or swelling Alignment: Symmetrical Functional ROM: Unrestricted ROM Stability: No instability detected Muscle Tone/Strength: Functionally intact. No obvious neuro-muscular anomalies detected. Sensory (Neurological): Unimpaired Muscle strength & Tone: No palpable anomalies  Lumbar Spine Area Exam  Skin & Axial Inspection: Well healed scar from previous spine surgery detected Alignment: Symmetrical Functional ROM: Decreased ROM       Stability: No instability detected Muscle Tone/Strength: Functionally intact. No obvious neuro-muscular anomalies  detected. Sensory (Neurological): Dermatomal pain pattern left L5-S1 Palpation: No palpable anomalies       Provocative Tests: Hyperextension/rotation test: deferred today       Lumbar quadrant test (Kemp's test): (+) on the left for foraminal stenosis Lateral bending test: deferred today       Patrick's Maneuver: deferred today                   FABER test: deferred today                   S-I anterior distraction/compression test: deferred today         S-I lateral compression test: deferred today         S-I Thigh-thrust test: deferred today         S-I Gaenslen's test: deferred today          Gait & Posture Assessment  Ambulation: Unassisted Gait: Limited. Using assistive device to ambulate Posture: Difficulty standing up straight, due to pain   Lower Extremity Exam    Side: Right lower extremity  Side: Left lower extremity  Stability: No instability observed          Stability: No instability observed          Skin & Extremity Inspection: Skin color, temperature, and hair growth are WNL. No peripheral edema or cyanosis. No masses, redness, swelling, asymmetry, or associated skin lesions. No contractures.  Skin & Extremity Inspection: Skin color, temperature, and hair growth are WNL. No peripheral edema or cyanosis. No masses, redness, swelling, asymmetry, or associated skin lesions. No contractures.  Functional ROM: Unrestricted ROM                  Functional ROM: Diminished ROM for all joints of the lower extremity          Muscle Tone/Strength: Functionally intact. No obvious neuro-muscular anomalies detected.  Muscle Tone/Strength: Functionally intact. No obvious neuro-muscular anomalies detected.  Sensory (Neurological): Unimpaired        Sensory (Neurological): Arthropathic arthralgia and dermatomal        DTR: Patellar: deferred today Achilles: deferred today Plantar: deferred today  DTR: Patellar: 1+: trace Achilles: 1+: trace Plantar: deferred today  Palpation: No  palpable anomalies  Palpation: No palpable anomalies   Assessment  Primary Diagnosis & Pertinent Problem List: The primary encounter diagnosis was Failed back surgical syndrome. Diagnoses of Chronic left-sided low back pain with left-sided sciatica, Lumbosacral radiculopathy, Chronic pain syndrome, and Chronic, continuous use of opioids were also pertinent to this visit.  Status Diagnosis  Persistent Persistent Persistent 1. Failed back surgical  syndrome   2. Chronic left-sided low back pain with left-sided sciatica   3. Lumbosacral radiculopathy   4. Chronic pain syndrome   5. Chronic, continuous use of opioids      General Recommendations: The pain condition that the patient suffers from is best treated with a multidisciplinary approach that involves an increase in physical activity to prevent de-conditioning and worsening of the pain cycle, as well as psychological counseling (formal and/or informal) to address the co-morbid psychological affects of pain. Treatment will often involve judicious use of pain medications and interventional procedures to decrease the pain, allowing the patient to participate in the physical activity that will ultimately produce long-lasting pain reductions. The goal of the multidisciplinary approach is to return the patient to a higher level of overall function and to restore their ability to perform activities of daily living.  77 year old male with a history of chronic lumbosacral radiculopathy, failed back surgical syndrome who follows up for medication management.  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  Patient had his entire unit explanted.  He continues on antibiotics.  Patient states that the spinal cord stimulator was providing him with pain relief in regards to his left radicular pain.  However since explant, he has experienced increase in pain.  He states that he has been utilizing more  of his Percocet.  Given that his SCS has been explanted and now that he has worsening pain, patient requested an increase in his Percocet to 10 mg up to 4 times a day which I think is reasonable.  Patient currently takes 7.5 mg 3 times daily to 4 times daily as needed.  Patient continues multimodal analgesics including Flexeril, gabapentin 1200 mg twice daily along with heat and physical therapy exercises at home.  We will also obtain UDS today.  Prescription for Percocet at higher dose below for 2 months.  Plan of Care  Pharmacotherapy (Medications Ordered): Meds ordered this encounter  Medications  . oxyCODONE-acetaminophen (PERCOCET) 10-325 MG tablet    Sig: Take 1 tablet by mouth every 6 (six) hours as needed for pain.    Dispense:  120 tablet    Refill:  0    Do not place this medication, or any other prescription from our practice, on "Automatic Refill". Patient may have prescription filled one day early if pharmacy is closed on scheduled refill date.  Marland Kitchen oxyCODONE-acetaminophen (PERCOCET) 10-325 MG tablet    Sig: Take 1 tablet by mouth every 6 (six) hours as needed for pain.    Dispense:  120 tablet    Refill:  0    Do not place this medication, or any other prescription from our practice, on "Automatic Refill". Patient may have prescription filled one day early if pharmacy is closed on scheduled refill date.   Lab-work, procedure(s), and/or referral(s): Orders Placed This Encounter  Procedures  . ToxASSURE Select 13 (MW), Urine    Provider-requested follow-up: Return in about 9 weeks (around 06/17/2018) for Medication Management.  Time Note: Greater than 50% of the 25 minute(s) of face-to-face time spent with Mr. Reaume, was spent in counseling/coordination of care regarding: Mr. Nihiser's primary cause of pain, the treatment plan, treatment alternatives, medication side effects, the opioid analgesic risks and possible complications, the appropriate use of his medications,  realistic expectations, the goals of pain management (increased in functionality), the medication agreement and the patient's responsibilities when it comes to controlled substances.  Future Appointments  Date Time Provider [place]  04/15/2018 11:00 AM Tsosie Billing, MD [facility] None  04/23/2018  8:45 AM [facility]  06/17/2018  8:45 AM [name], MD [facility] None  09/24/2018  8:00 AM Leone Haven, MD [facility]  11/17/2018  9:00 AM O'Brien-Blaney, Bryson Corona, LPN [facility]  06/12/4823  9:30 AM Leone Haven, MD [facility]    Primary Care Physician: Leone Haven, MD Location: [facility] Note by: [name], M.D Date: 04/15/2018; Time: 9:36 AM  Patient Instructions  Oxycodone with percocet to last until 06/22/2018 has been escribed to your pharmacy.

## 2018-04-15 NOTE — Patient Instructions (Addendum)
You are here for follow up of proteus infection at the site of spine stimulator which was removed on 04/02/18. You are on cefepime until 05/02/18. You are doing very well and the surgical site has healed well. You will be seeing neurosurgery today to get the stitches out. You wil complete cefepime on 12/13. And your PICC will be removed after that.

## 2018-04-15 NOTE — Progress Notes (Signed)
NAME: James Hood  DOB: [birth year]  MRN: [id number]  Date/Time: 04/15/2018 11:48 PM Subjective:  follow up care after hospital discharge Here with his wife? James Hood is a 77 y.o. male with a history of HTN, Essential tremors, OA, RT TKA, Degenerative spine disease with sciatica, s/p L5-s1  laminectomy , spine stimulator placement April 2019 for chronic pain followed by indolent infection of the surgical site was recently in [facility] between 11/13-11/15/19 for spinal device explantation because of infection with proteus is here to see me as follow up. Pt was discharged home on IV cefepime for 4 weeks and he is doing well He says the wounds have healed. He has no fever or chills or rash. No side effects from antibiotic which he takes twice a day intravenously Will be seeing Neurosurgery this afternoon for stitch removal  Past Medical History:  Diagnosis Date  . Anxiety   . Anxiety and depression   . Arthritis   . BPH (benign prostatic hyperplasia)   . 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 Colon polyps   . Dementia ([place])   . Depression   . Diabetes mellitus type 2 in nonobese (HCC)   . GERD (gastroesophageal reflux disease)    BARRETTS ESOPHAGUS RESOLVED PER PATIENT  . Headache   . 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[place])   . History of hiatal hernia   . Hypercholesteremia   . Hypertension   . Hyperthyroidism   . Neuropathic pain   . Primary localized osteoarthritis of right knee   . Stomach ulcer   . Tremor, essential     Past Surgical History:  Procedure Laterality Date  . BACK SURGERY    . COLONOSCOPY WITH PROPOFOL N/A 09/14/2016   Procedure: COLONOSCOPY WITH PROPOFOL;  Surgeon: Manya Silvas, MD;  Location: [facility] ENDOSCOPY;  Service: Endoscopy;  Laterality: N/A;  . esophageal stretch    . JOINT REPLACEMENT Right 2016   knee  . LUMBAR LAMINECTOMY/DECOMPRESSION MICRODISCECTOMY Left 02/03/2016   Procedure: LEFT L5-S1 DISKECTOMY;  Surgeon: Leeroy Cha, MD;  Location: [place] [facility] NEURO ORS;  Service: Neurosurgery;  Laterality: Left;  LEFT L5-S1 DISKECTOMY  . PULSE GENERATOR IMPLANT N/A 08/21/2017   Procedure: UNILATERAL PULSE GENERATOR IMPLANT;  Surgeon: Meade Maw, MD;  Location: [facility] ORS;  Service: Neurosurgery;  Laterality: N/A;  . PULSE GENERATOR IMPLANT Right 04/02/2018   Procedure: REMOVAL OF PULSE GENERATOR IMPLANT AND LEADS;  Surgeon: Meade Maw, MD;  Location: [facility] ORS;  Service: Neurosurgery;  Laterality: Right;  . TONSILLECTOMY    . TOTAL KNEE ARTHROPLASTY Right 11/15/2014   Procedure: TOTAL KNEE ARTHROPLASTY;  Surgeon: Elsie Saas, MD;  Location: [place];  Service: Orthopedics;  Laterality: Right;    T[place] Lives with his wife Former smoker   Family History  Problem Relation Age of Onset  . Heart attack Mother   . Heart disease Mother   . Hypertension Father   . Depression Father   . Kidney cancer Neg Hx   . Prostate cancer Neg Hx    No Known Allergies  ? Current Outpatient Medications  Medication Sig Dispense Refill  . aspirin EC 81 MG tablet Take 81 mg by mouth daily.    Marland Kitchen buPROPion (WELLBUTRIN SR) 150 MG 12 hr tablet TAKE ONE TABLET BY MOUTH TWICE A DAY 180 tablet 0  . ceFEPime (MAXIPIME) IVPB Inject 2 g into the vein every 12 (twelve) hours. Indication:  Spinal device infection Last Day of Therapy:  05/02/18 Labs - Once weekly:  CBC/D, CMP and ESR Please pull PIC at completion of IV antibiotics. 58 Units 0  . cyclobenzaprine (FLEXERIL) 10 MG tablet Take 1 tablet (10 mg total) by mouth 3 (three) times daily as needed for muscle spasms. 60 tablet 2  . diphenoxylate-atropine (LOMOTIL) 2.5-0.025 MG tablet Take 1 tablet by mouth 2 (two) times daily.     Marland Kitchen donepezil (ARICEPT) 10 MG tablet Take 10 mg by mouth at bedtime.    . fenofibrate (TRICOR) 145 MG tablet TAKE ONE TABLET BY MOUTH DAILY 90 tablet 0  . finasteride (PROSCAR) 5 MG tablet Take 1 tablet (5 mg total) by mouth daily. 30 tablet 2  . gabapentin  (NEURONTIN) 600 MG tablet TAKE TWO TABLETS BY MOUTH THREE TIMES A DAY 180 tablet 2  . lisinopril-hydrochlorothiazide (PRINZIDE,ZESTORETIC) 20-25 MG tablet TAKE ONE TABLET BY MOUTH TWICE A DAY 180 tablet 0  . Melatonin 10 MG TABS Take 20 mg by mouth at bedtime.    . memantine (NAMENDA) 10 MG tablet Take 10 mg by mouth 2 (two) times daily.     . metFORMIN (GLUCOPHAGE) 500 MG tablet TAKE TWO TABLETS BY MOUTH TWICE A DAY WITH A MEAL 360 tablet 1  . metoprolol succinate (TOPROL-XL) 25 MG 24 hr tablet Take 25 mg by mouth at bedtime.     . Multiple Vitamins-Minerals (CENTRUM SILVER PO) Take 1 tablet by mouth daily.    Marland Kitchen omeprazole (PRILOSEC) 20 MG capsule Take 20 mg by mouth 2 (two) times daily before a meal.     . [START ON 04/23/2018] oxyCODONE-acetaminophen (PERCOCET) 10-325 MG tablet Take 1 tablet by mouth every 6 (six) hours as needed for pain. 120 tablet 0  . [START ON 05/23/2018] oxyCODONE-acetaminophen (PERCOCET) 10-325 MG tablet Take 1 tablet by mouth every 6 (six) hours as needed for pain. 120 tablet 0  . primidone (MYSOLINE) 50 MG tablet Take 150-250 mg by mouth 2 (two) times daily. Take 250 mg by mouth in the morning & take 150 mg at night.    . QUEtiapine (SEROQUEL) 25 MG tablet Take 250 mg by mouth at bedtime.     . simvastatin (ZOCOR) 40 MG tablet Take 1 tablet (40 mg total) by mouth daily. 90 tablet 0  . sitaGLIPtin (JANUVIA) 100 MG tablet Take 1 tablet (100 mg total) by mouth daily. 30 tablet 2  . tamsulosin (FLOMAX) 0.4 MG CAPS capsule TAKE ONE CAPSULE BY MOUTH DAILY AFTER SUPPER 90 capsule 2  . divalproex (DEPAKOTE ER) 500 MG 24 hr tablet Take 1 tablet by mouth 2 (two) times daily.     No current facility-administered medications for this visit.      Abtx:  Anti-infectives (From admission, onward)   None      REVIEW OF SYSTEMS:  Const: negative fever, negative chills, negative weight loss Eyes: negative diplopia or visual changes, negative eye pain ENT: negative coryza, negative  sore throat Resp: negative cough, hemoptysis, dyspnea Cards: negative for chest pain, palpitations, lower extremity edema GU: negative for frequency, dysuria and hematuria GI: Negative for abdominal pain, diarrhea, bleeding, constipation Skin: negative for rash and pruritus Heme: negative for easy bruising and gum/nose bleeding MS: pain left side of hip and leg Neurolo:negative for headaches, dizziness, vertigo, memory problems  Psych: negative for feelings of anxiety, depression  Endocrine: negative for thyroid, diabetes Allergy/Immunology- negative for any medication or food allergies ?  Objective:  VITALS:  BP 107/73 (BP Location: Left Arm, Patient Position: Sitting, Cuff Size: Normal)   Pulse (!) 109  Temp (!) 97.4 F (36.3 C) (Oral)   Wt 180 lb (81.6 kg)   BMI 25.10 kg/m  PHYSICAL EXAM:  General: Alert, cooperative, no distress, appears stated age.  Head: Normocephalic, without obvious abnormality, atraumatic. Eyes: Conjunctivae clear, anicteric sclerae. Pupils are equal ENT Nares normal. No drainage or sinus tenderness. Lips, mucosa, and tongue normal. No Thrush Neck: Supple, symmetrical, no adenopathy, thyroid: non tender no carotid bruit and no JVD. Back: No CVA tenderness. Surgical incisions have healed well, no erythema, discharge or tenderness      Lungs: Clear to auscultation bilaterally. No Wheezing or Rhonchi. No rales. Heart: Regular rate and rhythm, no murmur, rub or gallop. Abdomen: Soft, non-tender,not distended. Bowel sounds normal. No masses Extremities:rt PICC atraumatic, no cyanosis. No edema. No clubbing Skin: No rashes or lesions. Or bruising Lymph: Cervical, supraclavicular normal. Neurologic: Grossly non-focal, left sciatica   Pertinent Labs Lab Results CBC    Component Value Date/Time   WBC 6.4 03/19/2018 1047   RBC 3.50 (L) 03/19/2018 1047   HGB 11.1 (L) 03/19/2018 1047   HCT 32.7 (L) 03/19/2018 1047   PLT 383.0 03/19/2018 1047   MCV  93.5 03/19/2018 1047   MCH 31.9 08/15/2017 1427   MCHC 33.9 03/19/2018 1047   RDW 13.1 03/19/2018 1047   LYMPHSABS 2.2 08/15/2017 1427   MONOABS 0.7 08/15/2017 1427   EOSABS 0.1 08/15/2017 1427   BASOSABS 0.0 08/15/2017 1427    CMP Latest Ref Rng & Units 03/19/2018 09/25/2017 08/15/2017  Glucose 70 - 99 mg/dL 143(H) 135(H) 105(H)  BUN 6 - 23 mg/dL 34(H) 27(H) 30(H)  Creatinine 0.40 - 1.50 mg/dL 1.32 1.20 0.94  Sodium 135 - 145 mEq/L 136 139 138  Potassium 3.5 - 5.1 mEq/L 4.8 4.5 4.4  Chloride 96 - 112 mEq/L 99 101 102  CO2 19 - 32 mEq/L _0 Calcium 8.4 - 10.5 mg/dL 9.7 9.2 9.2  Total Protein 6.0 - 8.3 g/dL - 6.3 -  Total Bilirubin 0.2 - 1.2 mg/dL - 0.2 -  Alkaline Phos 39 - 117 U/L - 32(L) -  AST 0 - 37 U/L - 20 -  ALT 0 - 53 U/L - 13 -      Microbiology: No results found for this or any previous visit (from the past 240 hour(s)). IMAGING RESULTS: ? 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  may follow that with 1-2 weeks of oral augmentin if needed. But looking at the way the wound has healed this may not be required. Following weekly labs/ ESR Cr from 11/19 is < 1   DM- on metformin and linagliptin  HTN on lisionpril, metoprolol  NCI- on memantine, doenpezil  Anxiety/Depression quetiapine and wellbutrin  ? ___________________________________________________ Discussed with patient and his wife in great detail. ? ?PICC will be removed after completion of antibiotic

## 2018-04-21 LAB — TOXASSURE SELECT 13 (MW), URINE

## 2018-04-22 ENCOUNTER — Other Ambulatory Visit: Payer: Self-pay | Admitting: Family Medicine

## 2018-04-22 ENCOUNTER — Other Ambulatory Visit: Payer: Self-pay | Admitting: Pharmacy Technician

## 2018-04-22 ENCOUNTER — Telehealth: Payer: Self-pay | Admitting: Radiology

## 2018-04-22 DIAGNOSIS — D649 Anemia, unspecified: Secondary | ICD-10-CM

## 2018-04-22 DIAGNOSIS — M1990 Unspecified osteoarthritis, unspecified site: Secondary | ICD-10-CM | POA: Diagnosis not present

## 2018-04-22 DIAGNOSIS — E7800 Pure hypercholesterolemia, unspecified: Secondary | ICD-10-CM | POA: Diagnosis not present

## 2018-04-22 DIAGNOSIS — T85733A Infection and inflammatory reaction due to implanted electronic neurostimulator of spinal cord, electrode (lead), initial encounter: Secondary | ICD-10-CM | POA: Diagnosis not present

## 2018-04-22 DIAGNOSIS — G8929 Other chronic pain: Secondary | ICD-10-CM | POA: Diagnosis not present

## 2018-04-22 DIAGNOSIS — Z792 Long term (current) use of antibiotics: Secondary | ICD-10-CM | POA: Diagnosis not present

## 2018-04-22 DIAGNOSIS — F17210 Nicotine dependence, cigarettes, uncomplicated: Secondary | ICD-10-CM | POA: Diagnosis not present

## 2018-04-22 DIAGNOSIS — M5442 Lumbago with sciatica, left side: Secondary | ICD-10-CM | POA: Diagnosis not present

## 2018-04-22 DIAGNOSIS — Z5181 Encounter for therapeutic drug level monitoring: Secondary | ICD-10-CM | POA: Diagnosis not present

## 2018-04-22 DIAGNOSIS — Z8659 Personal history of other mental and behavioral disorders: Secondary | ICD-10-CM | POA: Diagnosis not present

## 2018-04-22 DIAGNOSIS — I10 Essential (primary) hypertension: Secondary | ICD-10-CM | POA: Diagnosis not present

## 2018-04-22 DIAGNOSIS — Z96651 Presence of right artificial knee joint: Secondary | ICD-10-CM | POA: Diagnosis not present

## 2018-04-22 DIAGNOSIS — M961 Postlaminectomy syndrome, not elsewhere classified: Secondary | ICD-10-CM | POA: Diagnosis not present

## 2018-04-22 DIAGNOSIS — L98429 Non-pressure chronic ulcer of back with unspecified severity: Secondary | ICD-10-CM | POA: Diagnosis not present

## 2018-04-22 DIAGNOSIS — E119 Type 2 diabetes mellitus without complications: Secondary | ICD-10-CM | POA: Diagnosis not present

## 2018-04-22 DIAGNOSIS — M792 Neuralgia and neuritis, unspecified: Secondary | ICD-10-CM | POA: Diagnosis not present

## 2018-04-22 DIAGNOSIS — Z7984 Long term (current) use of oral hypoglycemic drugs: Secondary | ICD-10-CM | POA: Diagnosis not present

## 2018-04-22 DIAGNOSIS — F419 Anxiety disorder, unspecified: Secondary | ICD-10-CM | POA: Diagnosis not present

## 2018-04-22 DIAGNOSIS — Z452 Encounter for adjustment and management of vascular access device: Secondary | ICD-10-CM | POA: Diagnosis not present

## 2018-04-22 DIAGNOSIS — B964 Proteus (mirabilis) (morganii) as the cause of diseases classified elsewhere: Secondary | ICD-10-CM | POA: Diagnosis not present

## 2018-04-22 DIAGNOSIS — Z9889 Other specified postprocedural states: Secondary | ICD-10-CM | POA: Diagnosis not present

## 2018-04-22 DIAGNOSIS — F329 Major depressive disorder, single episode, unspecified: Secondary | ICD-10-CM | POA: Diagnosis not present

## 2018-04-22 DIAGNOSIS — F0390 Unspecified dementia without behavioral disturbance: Secondary | ICD-10-CM | POA: Diagnosis not present

## 2018-04-22 NOTE — Telephone Encounter (Signed)
Ordered

## 2018-04-22 NOTE — Telephone Encounter (Signed)
Pt coming in tomorrow for labs, please place future orders. Thank you.  

## 2018-04-22 NOTE — Patient Outreach (Signed)
[place] [facility]) Care Management  04/22/2018  DEMETRIOS BYRON 01/27/1941 [id number]    Received all necessary documents and signatures from both patient and provider.  Submitted completed Scientist, clinical (histocompatibility and immunogenetics) for [employer] via [employer].  Will followup with [employer] in 10-14 business days to inquire if the attestation letter has been mailed to the patient.  Rozlynn Lippold P. Conor Filsaime, [place] Management [phone]

## 2018-04-23 ENCOUNTER — Other Ambulatory Visit: Payer: PPO

## 2018-04-23 ENCOUNTER — Other Ambulatory Visit: Payer: Self-pay | Admitting: Family Medicine

## 2018-04-23 DIAGNOSIS — D649 Anemia, unspecified: Secondary | ICD-10-CM

## 2018-04-23 NOTE — Addendum Note (Signed)
Addended by: Arby Barrette on: 04/23/2018 08:32 AM   Modules accepted: Orders

## 2018-04-24 ENCOUNTER — Telehealth: Payer: Self-pay | Admitting: Family Medicine

## 2018-04-24 LAB — CBC
HCT: 30.2 % — ABNORMAL LOW (ref 38.5–50.0)
Hemoglobin: 10.5 g/dL — ABNORMAL LOW (ref 13.2–17.1)
MCH: 31.3 pg (ref 27.0–33.0)
MCHC: 34.8 g/dL (ref 32.0–36.0)
MCV: 90.1 fL (ref 80.0–100.0)
MPV: 10.8 fL (ref 7.5–12.5)
Platelets: 313 10*3/uL (ref 140–400)
RBC: 3.35 10*6/uL — ABNORMAL LOW (ref 4.20–5.80)
RDW: 12.9 % (ref 11.0–15.0)
WBC: 6.6 10*3/uL (ref 3.8–10.8)

## 2018-04-24 LAB — IRON,TIBC AND FERRITIN PANEL
%SAT: 31 % (calc) (ref 20–48)
Ferritin: 130 ng/mL (ref 24–380)
Iron: 109 ug/dL (ref 50–180)
TIBC: 347 mcg/dL (calc) (ref 250–425)

## 2018-04-24 NOTE — Telephone Encounter (Signed)
Sent to PCP as an FYI  

## 2018-04-24 NOTE — Telephone Encounter (Signed)
FYI

## 2018-04-24 NOTE — Telephone Encounter (Signed)
Pt given results per Dr Caryl Bis, "Please let the patient know that his anemia is slightly worse. That potentially could be related to the surgery he had between his prior check in this check. His iron studies are normal. I would suggest referral to hematology to consider evaluation for a cause of his anemia. If he is willing I can place a referral.  Thanks" the pt verbalized understanding and is agreeable to hematology referral; he can be reached at [phone]; will route to office for notification; unable to chart in result note because no encounter created.

## 2018-04-28 ENCOUNTER — Other Ambulatory Visit: Payer: Self-pay | Admitting: Family Medicine

## 2018-04-29 DIAGNOSIS — L98429 Non-pressure chronic ulcer of back with unspecified severity: Secondary | ICD-10-CM | POA: Diagnosis not present

## 2018-04-29 DIAGNOSIS — T85733A Infection and inflammatory reaction due to implanted electronic neurostimulator of spinal cord, electrode (lead), initial encounter: Secondary | ICD-10-CM | POA: Diagnosis not present

## 2018-05-05 ENCOUNTER — Other Ambulatory Visit: Payer: Self-pay | Admitting: Pharmacy Technician

## 2018-05-05 NOTE — Patient Outreach (Signed)
[place] [facility]) Care Management  05/05/2018  James Hood [birth year] [id number]   Care coordination call placed to [employer] patient assistance in regards to patient's application for Januvia.  Spoke to [place] who said the application was received on 05/01/18 and the attestation letter was mailed the same day.  Spoke to patient, HIPAA identifiers verified. Informed patient to be on the lookout for correspondence from [employer] patient assistance. Informed patient to call me so we could go over the letter and questions together. Patient verbalized understanding.  Will followup with patient in 10-14 business days if call has not been returned.  Oceana Walthall P. Jad Johansson, [place] Management [phone]

## 2018-05-22 ENCOUNTER — Other Ambulatory Visit: Payer: Self-pay | Admitting: Family Medicine

## 2018-05-22 ENCOUNTER — Other Ambulatory Visit: Payer: Self-pay | Admitting: Pharmacy Technician

## 2018-05-22 NOTE — Patient Outreach (Signed)
[facility] [facility]) Care Management  05/22/2018  CARLO GUEVARRA 1940/07/18 [id number]    Successful outgoing call placed to patient in regards to his patient assistance application with [employer] for [employer].  Called to inquire if patient has received the attestation letter from [employer]. Spoke to Mr. Stavros. HIPPA identifiers verified. Patient informed that he had received the letter and had mailed it back to [employer] approximately 3 weeks ago.  Care coordination call placed to [employer]. Spoke to Honey. Honey informed me that the patient was APPROVED from  05/20/2018-05/21/2019. Honey informed that it generally takes 2-3 business days for the pharmacy to received the request and then another 7-10 business days before it arrives to the patient's residence.  Will followup with patient in 10-14 business days to confirm receipt of medication.  Enolia Koepke P. Sri Clegg, [place] Management [phone]

## 2018-06-09 ENCOUNTER — Other Ambulatory Visit: Payer: Self-pay | Admitting: Pharmacy Technician

## 2018-06-09 NOTE — Patient Outreach (Signed)
[place] [facility]) Care Management  06/09/2018  James Hood Feb 03, 1941 [id number]   Successful outreach call placed to patient in regards to his [employer] patient assistance application for Januvia.   Spoke to James Hood, HIPAA identifiers verified. Confirmed with patient that he had received a 90 days supply of his medication along with instructions on how to obtain refills. Inquired of patient if he had any questions on that process and he stated that he did not. Informed patient to call in his refills when he has approximately 2 week supply remaining as to avoid a delay in therapy. Patient verbalized understanding. Inquired of patient if he had any other additional concerns or questions and he stated that he did not. Confirmed with patient that he had our namae and number here at [facility] if questions or concerns were to arise.  Will remove myself from care team as patient assistance has been completed.  Lisandra Mathisen P. Johnpaul Gillentine, [place] Management [phone]

## 2018-06-11 DIAGNOSIS — H33311 Horseshoe tear of retina without detachment, right eye: Secondary | ICD-10-CM | POA: Diagnosis not present

## 2018-06-11 DIAGNOSIS — E119 Type 2 diabetes mellitus without complications: Secondary | ICD-10-CM | POA: Diagnosis not present

## 2018-06-11 LAB — HM DIABETES EYE EXAM

## 2018-06-16 ENCOUNTER — Other Ambulatory Visit: Payer: Self-pay | Admitting: Family Medicine

## 2018-06-17 ENCOUNTER — Ambulatory Visit
Payer: PPO | Attending: Student in an Organized Health Care Education/Training Program | Admitting: Student in an Organized Health Care Education/Training Program

## 2018-06-17 ENCOUNTER — Encounter: Payer: Self-pay | Admitting: Student in an Organized Health Care Education/Training Program

## 2018-06-17 ENCOUNTER — Other Ambulatory Visit: Payer: Self-pay

## 2018-06-17 VITALS — BP 128/83 | HR 89 | Temp 98.1°F | Ht 71.0 in | Wt 180.0 lb

## 2018-06-17 DIAGNOSIS — M19072 Primary osteoarthritis, left ankle and foot: Secondary | ICD-10-CM | POA: Diagnosis not present

## 2018-06-17 DIAGNOSIS — T85192S Other mechanical complication of implanted electronic neurostimulator (electrode) of spinal cord, sequela: Secondary | ICD-10-CM | POA: Diagnosis not present

## 2018-06-17 DIAGNOSIS — M5417 Radiculopathy, lumbosacral region: Secondary | ICD-10-CM

## 2018-06-17 DIAGNOSIS — M961 Postlaminectomy syndrome, not elsewhere classified: Secondary | ICD-10-CM | POA: Diagnosis not present

## 2018-06-17 DIAGNOSIS — M5442 Lumbago with sciatica, left side: Secondary | ICD-10-CM | POA: Diagnosis not present

## 2018-06-17 DIAGNOSIS — F1190 Opioid use, unspecified, uncomplicated: Secondary | ICD-10-CM | POA: Diagnosis not present

## 2018-06-17 DIAGNOSIS — G8929 Other chronic pain: Secondary | ICD-10-CM | POA: Diagnosis not present

## 2018-06-17 DIAGNOSIS — G894 Chronic pain syndrome: Secondary | ICD-10-CM | POA: Diagnosis not present

## 2018-06-17 MED ORDER — OXYCODONE-ACETAMINOPHEN 10-325 MG PO TABS: 1.0000 | ORAL_TABLET | Freq: Four times a day (QID) | ORAL | 0 refills | Status: DC | PRN

## 2018-06-17 NOTE — Patient Instructions (Addendum)
You have been escribed 2 prescriptions of oxycodone to last you until 08-31-2018

## 2018-06-17 NOTE — Progress Notes (Signed)
Nursing Pain Medication Assessment:  Safety precautions to be maintained throughout the outpatient stay will include: orient to surroundings, keep bed in low position, maintain call bell within reach at all times, provide assistance with transfer out of bed and ambulation.  Medication Inspection Compliance: Pill count conducted under aseptic conditions, in front of the patient. Neither the pills nor the bottle was removed from the patient's sight at any time. Once count was completed pills were immediately returned to the patient in their original bottle.  Medication: Oxycodone/APAP Pill/Patch Count: 60 of 120 pills remain Pill/Patch Appearance: Markings consistent with prescribed medication Bottle Appearance: Standard pharmacy container. Clearly labeled. Filled Date: 1 / 74 / 2020 Last Medication intake:  Today

## 2018-06-17 NOTE — Progress Notes (Signed)
Patient's Name: James Hood  MRN: [id number]  Referring Provider: Leone Haven, MD  DOB: 10-12-1940  PCP: James Haven, MD  DOS: 06/17/2018  Note by: [name], MD  Service setting: Ambulatory outpatient  Specialty: Interventional Pain Management  Location: [facility] (AMB) Pain Management Facility    Patient type: Established   Primary Reason(s) for Visit: Encounter for prescription drug management. (Level of risk: moderate)  CC: Back Pain  HPI  James Hood is a 78 y.o. year old, male patient, who comes today for a medication management evaluation. He has Hypertension; Tremor, essential; Primary localized osteoarthritis of right knee; Anxiety and depression; DJD (degenerative joint disease) of knee; Trochanteric bursitis of left hip; Headache; Falls; Barrett's esophagus; Chronic lumbar pain; Benign prostatic hyperplasia; Benign essential tremor; Benign neoplasm of colon; Chronic diarrhea; Type 2 diabetes mellitus ([place]); Difficulty in walking; HLD (hyperlipidemia); Amnesia; Testicular hypofunction; Absence of sensation; Episode of syncope; Hypertriglyceridemia; Anemia; Lumbar herniated disc; Night sweats; Right hip pain; Purpura ([place]); Thyroid nodule; Fatty liver; S/P insertion of spinal cord stimulator; Chronic pain syndrome; Failed back surgical syndrome; Lumbosacral radiculopathy; Primary osteoarthritis of left ankle; Facet arthritis of lumbar region; Upper respiratory infection; and 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 His primarily concern today is the Back Pain  Pain Assessment: Location: Lower Back Radiating: Denies Onset: More than a month ago Duration: Chronic pain Quality: Aching Severity: 2 /10 (subjective, self-reported pain score)  Note: Reported level is compatible with observation.                         When using our objective Pain Scale, levels between 6 and 10/10 are said to belong in an emergency room, as it progressively worsens  from a 6/10, described as severely limiting, requiring emergency care not usually available at an outpatient pain management facility. At a 6/10 level, communication becomes difficult and requires great effort. Assistance to reach the emergency department may be required. Facial flushing and profuse sweating along with potentially dangerous increases in heart rate and blood pressure will be evident. Effect on ADL: limts my daily activities Timing: Intermittent Modifying factors: medication is working wonderfully BP: 128/83  HR: 89  James Hood was last scheduled for an appointment on 04/15/2018 for medication management. During today's appointment we reviewed James Hood's chronic pain status, as well as his outpatient medication regimen.  Patient follows up today for medication management.  He states that he is endorsing benefit at his current dose of Percocet at 10 mg 4 times daily as needed.  Patient has finished IV antibiotics for his hardware infection.  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  Patient has had SCS system removed.  He states that he is continuing to endorse benefit along his left lower extremity similar to as if he still has the spinal cord stimulator in place.  Continues to endorse chronic left ankle pain.  Patient is not interested in having spinal cord stimulator replaced.  The patient  reports no history of drug use. His body mass index is 25.1 kg/m.  Further details on both, my assessment(s), as well as the proposed treatment plan, please see below.  Controlled Substance Pharmacotherapy Assessment REMS (Risk Evaluation and Mitigation Strategy)  Analgesic: Percocet 10 mg 4 times daily as needed, quantity 120/month MME/day: 60 mg/day.  (This was increased from MME 45-60 after patient's SCS was explanted) James Fischer,  RN  06/17/2018  8:40 AM  Sign when Signing Visit Nursing Pain Medication  Assessment:  Safety precautions to be maintained throughout the outpatient stay will include: orient to surroundings, keep bed in low position, maintain call bell within reach at all times, provide assistance with transfer out of bed and ambulation.  Medication Inspection Compliance: Pill count conducted under aseptic conditions, in front of the patient. Neither the pills nor the bottle was removed from the patient's sight at any time. Once count was completed pills were immediately returned to the patient in their original bottle.  Medication: Oxycodone/APAP Pill/Patch Count: 60 of 120 pills remain Pill/Patch Appearance: Markings consistent with prescribed medication Bottle Appearance: Standard pharmacy container. Clearly labeled. Filled Date: 1 / 80 / 2020 Last Medication intake:  Today   Pharmacokinetics: Liberation and absorption (onset of action): WNL Distribution (time to peak effect): WNL Metabolism and excretion (duration of action): WNL         Pharmacodynamics: Desired effects: Analgesia: James Hood reports >50% benefit. Functional ability: Patient reports that medication allows him to accomplish basic ADLs Clinically meaningful improvement in function (CMIF): Sustained CMIF goals met Perceived effectiveness: Described as relatively effective, allowing for increase in activities of daily living (ADL) Undesirable effects: Side-effects or Adverse reactions: None reported Monitoring: [place] PMP: Online review of the past 55-monthperiod conducted. Compliant with practice rules and regulations Last UDS on record: Summary  Date Value Ref Range Status  04/15/2018 FINAL  Final    Comment:    ==================================================================== TOXASSURE SELECT 13 (MW) ==================================================================== Test                             Result       Flag       Units Drug Present and Declared for Prescription Verification   Oxycodone                       133          EXPECTED   ng/mg creat   Oxymorphone                    70           EXPECTED   ng/mg creat   Noroxycodone                   1417         EXPECTED   ng/mg creat   Noroxymorphone                 83           EXPECTED   ng/mg creat    Sources of oxycodone are scheduled prescription medications.    Oxymorphone, noroxycodone, and noroxymorphone are expected    metabolites of oxycodone. Oxymorphone is also available as a    scheduled prescription medication.   Phenobarbital                  PRESENT      EXPECTED    Phenobarbital is an expected metabolite of primidone;    Phenobarbital may also be administered as a prescription drug. ==================================================================== Test                      Result    Flag   Units      Ref Range   Creatinine  76               mg/dL      >=20 ==================================================================== Declared Medications:  The flagging and interpretation on this report are based on the  following declared medications.  Unexpected results may arise from  inaccuracies in the declared medications.  **Note: The testing scope of this panel includes these medications:  Oxycodone (Oxycodone Acetaminophen)  Primidone (Mysoline)  **Note: The testing scope of this panel does not include following  reported medications:  Acetaminophen (Oxycodone Acetaminophen)  Aspirin (Aspirin 81)  Atropine (Lomotil)  Bupropion  Cefepime  Cyclobenzaprine  Diphenoxylate (Lomotil)  Divalproex (Depakote)  Donepezil (Aricept)  Fenofibrate (Tricor)  Finasteride  Gabapentin  Hydrochlorothiazide (Prinzide)  Lisinopril (Prinzide)  Melatonin  Memantine  Metformin  Metoprolol  Multivitamin  Omeprazole  Quetiapine  Simvastatin  Sitagliptin (Januvia)  Tamsulosin ==================================================================== For clinical consultation, please call [phone]. ====================================================================    UDS interpretation: Compliant          Medication Assessment Form: Reviewed. Patient indicates being compliant with therapy Treatment compliance: Compliant Risk Assessment Profile: Aberrant behavior: See prior evaluations. None observed or detected today Comorbid factors increasing risk of overdose: See prior notes. No additional risks detected today Opioid risk tool (ORT) (Total Score): 3 Personal History of Substance Abuse (SUD-Substance use disorder):  Alcohol: Positive Male or Male  Illegal Drugs: Negative  Rx Drugs: Negative  ORT Risk Level calculation: Low Risk Risk of substance use disorder (SUD): Low Opioid Risk Tool - 06/17/18 0849      Family History of Substance Abuse   Alcohol  Negative    Illegal Drugs  Negative    Rx Drugs  Negative      Personal History of Substance Abuse   Alcohol  Positive Male or Male    Illegal Drugs  Negative    Rx Drugs  Negative      Age   Age between 62-45 years   No      History of Preadolescent Sexual Abuse   History of Preadolescent Sexual Abuse  Negative or Male      Psychological Disease   Psychological Disease  Negative    Depression  Negative      Total Score   Opioid Risk Tool Scoring  3    Opioid Risk Interpretation  Low Risk      ORT Scoring interpretation table:  Score <3 = Low Risk for SUD  Score between 4-7 = Moderate Risk for SUD  Score >8 = High Risk for Opioid Abuse   Risk Mitigation Strategies:  Patient Counseling: Covered Patient-Prescriber Agreement (PPA): Present and active  Notification to other healthcare providers: Done  Pharmacologic Plan: No change in therapy, at this time.             Laboratory Chemistry  Inflammation Markers (CRP: Acute Phase) (ESR: Chronic Phase) Lab Results  Component Value Date   ESRSEDRATE 11 01/29/2017                         Rheumatology Markers No results found for: RF, ANA,  LABURIC, URICUR, LYMEIGGIGMAB, LYMEABIGMQN, HLAB27                      Renal Function Markers Lab Results  Component Value Date   BUN 34 (H) 03/19/2018   CREATININE 1.32 03/19/2018   GFRAA >60 08/15/2017   GFRNONAA >60 08/15/2017  Hepatic Function Markers Lab Results  Component Value Date   AST 20 09/25/2017   ALT 13 09/25/2017   ALBUMIN 3.9 09/25/2017   ALKPHOS 32 (L) 09/25/2017   AMMONIA 14 08/09/2016                        Electrolytes Lab Results  Component Value Date   NA 136 03/19/2018   K 4.8 03/19/2018   CL 99 03/19/2018   CALCIUM 9.7 03/19/2018                        Neuropathy Markers Lab Results  Component Value Date   VITAMINB12 433 03/19/2018   FOLATE 13.3 03/19/2018   HGBA1C 7.5 (H) 03/19/2018   HIV NONREACTIVE 06/19/2016                        CNS Tests No results found for: COLORCSF, APPEARCSF, RBCCOUNTCSF, WBCCSF, POLYSCSF, LYMPHSCSF, EOSCSF, PROTEINCSF, GLUCCSF, JCVIRUS, CSFOLI, IGGCSF                      Bone Pathology Markers No results found for: VD25OH, [id number], [id number], [id number], 25OHVITD1, 25OHVITD2, 25OHVITD3, TESTOFREE, TESTOSTERONE                       Coagulation Parameters Lab Results  Component Value Date   INR 0.91 04/01/2018   LABPROT 12.2 04/01/2018   APTT 35 04/01/2018   PLT 313 04/23/2018                        Cardiovascular Markers Lab Results  Component Value Date   CKTOTAL 468 (H) 08/14/2016   HGB 10.5 (L) 04/23/2018   HCT 30.2 (L) 04/23/2018                         CA Markers No results found for: CEA, CA125, LABCA2                      Note: Lab results reviewed.  Recent Diagnostic Imaging Results  [place] EKG SITE RITE If Site Rite image not attached, placement could not be confirmed due to  current cardiac rhythm.  Complexity Note: Imaging results reviewed. Results shared with Mr. Fugate, using Layman's terms.                         Meds   Current Outpatient  Medications:  .  aspirin EC 81 MG tablet, Take 81 mg by mouth daily., Disp: , Rfl:  .  buPROPion (WELLBUTRIN SR) 150 MG 12 hr tablet, TAKE ONE TABLET BY MOUTH TWICE A DAY, Disp: 180 tablet, Rfl: 0 .  cyclobenzaprine (FLEXERIL) 10 MG tablet, Take 1 tablet (10 mg total) by mouth 3 (three) times daily as needed for muscle spasms., Disp: 60 tablet, Rfl: 2 .  diphenoxylate-atropine (LOMOTIL) 2.5-0.025 MG tablet, Take 1 tablet by mouth 2 (two) times daily. , Disp: , Rfl:  .  divalproex (DEPAKOTE ER) 500 MG 24 hr tablet, Take 1 tablet by mouth 2 (two) times daily., Disp: , Rfl:  .  donepezil (ARICEPT) 10 MG tablet, Take 10 mg by mouth at bedtime., Disp: , Rfl:  .  fenofibrate (TRICOR) 145 MG tablet, TAKE ONE TABLET BY MOUTH DAILY, Disp: 90 tablet, Rfl: 0 .  finasteride (PROSCAR) 5 MG  tablet, Take 1 tablet (5 mg total) by mouth daily., Disp: 30 tablet, Rfl: 2 .  gabapentin (NEURONTIN) 600 MG tablet, TAKE TWO TABLETS BY MOUTH THREE TIMES A DAY, Disp: 180 tablet, Rfl: 1 .  lisinopril-hydrochlorothiazide (PRINZIDE,ZESTORETIC) 20-25 MG tablet, TAKE ONE TABLET BY MOUTH TWICE A DAY, Disp: 180 tablet, Rfl: 0 .  Melatonin 10 MG TABS, Take 20 mg by mouth at bedtime., Disp: , Rfl:  .  memantine (NAMENDA) 10 MG tablet, Take 10 mg by mouth 2 (two) times daily. , Disp: , Rfl:  .  metFORMIN (GLUCOPHAGE) 500 MG tablet, TAKE TWO TABLETS BY MOUTH TWICE A DAY WITH A MEAL, Disp: 360 tablet, Rfl: 0 .  metoprolol succinate (TOPROL-XL) 25 MG 24 hr tablet, Take 25 mg by mouth at bedtime. , Disp: , Rfl:  .  Multiple Vitamins-Minerals (CENTRUM SILVER PO), Take 1 tablet by mouth daily., Disp: , Rfl:  .  omeprazole (PRILOSEC) 20 MG capsule, Take 20 mg by mouth 2 (two) times daily before a meal. , Disp: , Rfl:  .  [START ON 07/02/2018] oxyCODONE-acetaminophen (PERCOCET) 10-325 MG tablet, Take 1 tablet by mouth every 6 (six) hours as needed for up to 30 days for pain., Disp: 120 tablet, Rfl: 0 .  primidone (MYSOLINE) 50 MG tablet, Take  150-250 mg by mouth 2 (two) times daily. Take 250 mg by mouth in the morning & take 150 mg at night., Disp: , Rfl:  .  QUEtiapine (SEROQUEL) 25 MG tablet, Take 250 mg by mouth at bedtime. , Disp: , Rfl:  .  simvastatin (ZOCOR) 40 MG tablet, Take 1 tablet (40 mg total) by mouth daily., Disp: 90 tablet, Rfl: 0 .  sitaGLIPtin (JANUVIA) 100 MG tablet, Take 1 tablet (100 mg total) by mouth daily., Disp: 30 tablet, Rfl: 2 .  tamsulosin (FLOMAX) 0.4 MG CAPS capsule, TAKE ONE CAPSULE BY MOUTH DAILY AFTER SUPPER, Disp: 90 capsule, Rfl: 2 .  [START ON 08/01/2018] oxyCODONE-acetaminophen (PERCOCET) 10-325 MG tablet, Take 1 tablet by mouth every 6 (six) hours as needed for up to 30 days for pain., Disp: 120 tablet, Rfl: 0  ROS  Constitutional: Denies any fever or chills Gastrointestinal: No reported hemesis, hematochezia, vomiting, or acute GI distress Musculoskeletal: Denies any acute onset joint swelling, redness, loss of ROM, or weakness Neurological: No reported episodes of acute onset apraxia, aphasia, dysarthria, agnosia, amnesia, paralysis, loss of coordination, or loss of consciousness  Allergies  Mr. Debski has No Known Allergies.  [place]  Drug: Mr. Epp  reports no history of drug use. Alcohol:  reports previous alcohol use. Tobacco:  reports that he quit smoking about 2 months ago. His smoking use included cigarettes. He has a 30.00 pack-year smoking history. He has never used smokeless tobacco. 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 Surgical: Mr. Hanley  has a past surgical history that includes esophageal stretch; Tonsillectomy; Total knee arthroplasty (Right, 11/15/2014); Lumbar laminectomy/decompression microdiscectomy (Left, 02/03/2016); Colonoscopy with propofol (N/A, 09/14/2016); Joint replacement (Right, 2016); Pulse generator implant (N/A, 08/21/2017); Back surgery; and Pulse generator implant (Right, 04/02/2018). Family: family history includes Depression in his father; Heart attack in his mother; Heart  disease in his mother; Hypertension in his father.  Constitutional Exam  General appearance: Well nourished, well developed, and well hydrated. In no apparent acute distress Vitals:   06/17/18 0840  BP: 128/83  Pulse: 89  Temp: 98.1 F (36.7 C)  SpO2: 95%  Weight: 180 lb (81.6 kg)  Height: '5\' 11"'$  (1.803 m)   BMI Assessment: Estimated body mass index is 25.1 kg/m as calculated from the following:   Height as of this encounter: '5\' 11"'$  (1.803 m).   Weight as of this encounter: 180 lb (81.6 kg).  BMI interpretation table: BMI level Category Range association with higher incidence of chronic pain  <18 kg/m2 Underweight   18.5-24.9 kg/m2 Ideal body weight   25-29.9 kg/m2 Overweight Increased incidence by 20%  30-34.9 kg/m2 Obese (Class I) Increased incidence by 68%  35-39.9 kg/m2 Severe obesity (Class II) Increased incidence by 136%  >40 kg/m2 Extreme obesity (Class III) Increased incidence by 254%   Patient's current BMI Ideal Body weight  Body mass index is 25.1 kg/m. Ideal body weight: 75.3 kg (166 lb 0.1 oz) Adjusted ideal body weight: 77.8 kg (171 lb 9.7 oz)   BMI Readings from Last 4 Encounters:  06/17/18 25.10 kg/m  04/15/18 25.10 kg/m  04/15/18 25.10 kg/m  04/02/18 26.00 kg/m   Wt Readings from Last 4 Encounters:  06/17/18 180 lb (81.6 kg)  04/15/18 180 lb (81.6 kg)  04/15/18 180 lb (81.6 kg)  04/02/18 181 lb 3.5 oz (82.2 kg)  Psych/Mental status: Alert,  oriented x 3 (person, place, & time)       Eyes: PERLA Respiratory: No evidence of acute respiratory distress  Cervical Spine Area Exam  Skin & Axial Inspection: No masses, redness, edema, swelling, or associated skin lesions Alignment: Symmetrical Functional ROM: Unrestricted ROM      Stability: No instability detected Muscle Tone/Strength: Functionally intact. No obvious neuro-muscular anomalies detected. Sensory (Neurological): Unimpaired Palpation: No palpable anomalies              Upper Extremity (UE) Exam    Side: Right upper extremity  Side: Left upper extremity  Skin & Extremity Inspection: Skin color, temperature, and hair growth are WNL. No peripheral edema or cyanosis. No masses, redness, swelling, asymmetry, or associated skin lesions. No contractures.  Skin & Extremity Inspection: Skin color, temperature, and hair growth are WNL. No peripheral edema or cyanosis. No masses, redness, swelling, asymmetry, or associated skin lesions. No contractures.  Functional ROM: Unrestricted ROM          Functional ROM: Unrestricted ROM          Muscle Tone/Strength: Functionally intact. No obvious neuro-muscular anomalies detected.  Muscle Tone/Strength: Functionally intact. No obvious neuro-muscular anomalies detected.  Sensory (Neurological): Unimpaired          Sensory (Neurological): Unimpaired          Palpation: No palpable anomalies              Palpation: No palpable anomalies              Provocative Test(s):  Phalen's test: deferred Tinel's test: deferred Apley's scratch test (touch opposite shoulder):  Action 1 (Across chest): deferred Action 2 (Overhead): deferred Action 3 (LB reach): deferred   Provocative Test(s):  Phalen's test: deferred Tinel's test: deferred Apley's scratch test (touch opposite shoulder):  Action 1 (Across chest): deferred Action 2 (Overhead): deferred Action 3 (LB reach): deferred    Thoracic Spine Area Exam  Skin & Axial Inspection: No masses,  redness, or swelling Alignment: Symmetrical Functional ROM: Unrestricted ROM Stability: No instability detected Muscle Tone/Strength: Functionally intact. No obvious neuro-muscular anomalies detected. Sensory (Neurological): Unimpaired Muscle strength & Tone: No palpable anomalies  Lumbar Spine Area Exam  Skin & Axial Inspection: Well healed scar from previous spine surgery detected Alignment: Symmetrical Functional ROM: Pain restricted ROM       Stability: No instability detected Muscle Tone/Strength: Functionally intact. No obvious neuro-muscular anomalies detected. Sensory (Neurological): Dermatomal pain pattern left greater than right Palpation: No palpable anomalies       Provocative Tests: Hyperextension/rotation test: (+) bilaterally for facet joint pain. Lumbar quadrant test (Kemp's test): (+) on the left for foraminal stenosis Lateral bending test: deferred today       Patrick's Maneuver: deferred today                   FABER* test: deferred today                   S-I anterior distraction/compression test: deferred today         S-I lateral compression test: deferred today         S-I Thigh-thrust test: deferred today         S-I Gaenslen's test: deferred today         *(Flexion, ABduction and External Rotation)  Gait & Posture Assessment  Ambulation: Unassisted Gait: Relatively normal for age and body habitus Posture: WNL   Lower Extremity Exam    Side: Right lower extremity  Side: Left lower extremity  Stability: No instability observed          Stability: No instability observed          Skin & Extremity Inspection: Skin color, temperature, and hair growth are WNL. No peripheral edema or cyanosis. No masses, redness, swelling, asymmetry, or associated skin lesions. No contractures.  Skin & Extremity Inspection: Skin color, temperature, and hair growth are WNL. No peripheral edema or cyanosis. No masses, redness, swelling, asymmetry, or associated skin lesions. No  contractures.  Functional ROM: Unrestricted ROM                  Functional ROM: Decreased ROM for hip and knee joints          Muscle Tone/Strength: Functionally intact. No obvious neuro-muscular anomalies detected.  Muscle Tone/Strength: Functionally intact. No obvious neuro-muscular anomalies detected.  Sensory (Neurological): Unimpaired        Sensory (Neurological): Dermatomal pain pattern and arthropathic        DTR: Patellar: deferred today Achilles: deferred today Plantar: deferred today  DTR: Patellar: 1+: trace Achilles: 0: absent Plantar: deferred today  Palpation: No palpable anomalies  Palpation: No palpable anomalies   Assessment  Primary Diagnosis & Pertinent Problem List: The primary encounter diagnosis was Failed back surgical syndrome. 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  Status Diagnosis  Controlled Controlled Controlled 1. Failed back surgical syndrome   2. Chronic left-sided low back pain with left-sided sciatica   3. Lumbosacral radiculopathy   4. Chronic pain syndrome   5. Chronic, continuous use of opioids   6. Primary osteoarthritis of left ankle   7. 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  He states that he is endorsing benefit at his current dose of Percocet at 10 mg 4 times daily as needed.  Patient has finished IV antibiotics for his hardware infection.  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  Patient has had SCS system removed.  He states that he is continuing to endorse benefit along his left lower extremity similar  to as if he still has the spinal cord stimulator in place.  Continues to endorse chronic left ankle pain.  Patient is not interested in having spinal cord stimulator replaced.  Medication refill for chronic pain secondary to failed back surgical syndrome, status post laminectomy, status post spinal cord stimulator trial status post SCS explant secondary to hardware infection.  Patient does have chronic lumbosacral radiculopathy on the left.  He finds his increased dose of Percocet helpful in managing his pain and also assisting with ADLs.  I informed patient that no further dose escalation beyond this and we may consider weaning in the future.  Could also consider opioid rotation if patient experiences decrease in effectiveness.  PDMP checked and up-to-date.  UDS up-to-date.  Refill of medications as below for 2 months.  Continue multimodal analgesics including Flexeril 10 mg twice daily as needed, gabapentin and ibuprofen as needed.   Plan of Care  Pharmacotherapy (Medications Ordered): Meds ordered this encounter  Medications  . oxyCODONE-acetaminophen (PERCOCET) 10-325 MG tablet    Sig: Take 1 tablet by mouth every 6 (six) hours as needed for up to 30 days for pain.    Dispense:  120 tablet    Refill:  0    Do not place this medication, or any other prescription from our practice, on "Automatic Refill". Patient may have prescription filled one day early if pharmacy is closed on scheduled refill date.  Marland Kitchen oxyCODONE-acetaminophen (PERCOCET) 10-325 MG tablet    Sig: Take 1 tablet by mouth every 6 (six) hours as needed for up to 30 days for pain.    Dispense:  120 tablet    Refill:  0    Do not place this medication, or any other prescription from our practice, on "Automatic Refill". Patient may have prescription filled one day early if pharmacy is closed on scheduled refill date.   Provider-requested follow-up: Return in about 10 weeks (around 08/26/2018) for Medication Management.  Future  Appointments  Date Time Provider [place]  08/26/2018  8:45 AM [name], MD [facility] None  09/24/2018  8:00 AM James Haven, MD [facility]  11/17/2018  9:00 AM O'Brien-Blaney, Bryson Corona, LPN [facility]  01/05/7115  9:30 AM James Haven, MD [facility]    Primary Care Physician: James Haven, MD Location: [facility] Note by: [name], M.D Date: 06/17/2018; Time: 9:23 AM  Patient Instructions  You have been escribed 2 prescriptions of oxycodone to last you until 08-31-2018

## 2018-06-23 ENCOUNTER — Other Ambulatory Visit: Payer: Self-pay | Admitting: Family Medicine

## 2018-06-28 ENCOUNTER — Other Ambulatory Visit: Payer: Self-pay | Admitting: Family Medicine

## 2018-06-30 ENCOUNTER — Encounter: Payer: Self-pay | Admitting: Family Medicine

## 2018-07-05 ENCOUNTER — Other Ambulatory Visit: Payer: Self-pay | Admitting: Family Medicine

## 2018-07-07 ENCOUNTER — Other Ambulatory Visit: Payer: Self-pay | Admitting: Family Medicine

## 2018-07-18 ENCOUNTER — Other Ambulatory Visit: Payer: Self-pay | Admitting: Family Medicine

## 2018-08-06 ENCOUNTER — Other Ambulatory Visit: Payer: Self-pay | Admitting: Family Medicine

## 2018-08-06 DIAGNOSIS — K2270 Barrett's esophagus without dysplasia: Secondary | ICD-10-CM | POA: Diagnosis not present

## 2018-08-06 DIAGNOSIS — R197 Diarrhea, unspecified: Secondary | ICD-10-CM | POA: Diagnosis not present

## 2018-08-06 DIAGNOSIS — K219 Gastro-esophageal reflux disease without esophagitis: Secondary | ICD-10-CM | POA: Diagnosis not present

## 2018-08-17 ENCOUNTER — Other Ambulatory Visit: Payer: Self-pay | Admitting: Family Medicine

## 2018-08-26 ENCOUNTER — Encounter: Payer: Self-pay | Admitting: Student in an Organized Health Care Education/Training Program

## 2018-08-26 ENCOUNTER — Other Ambulatory Visit: Payer: Self-pay

## 2018-08-26 ENCOUNTER — Ambulatory Visit
Payer: PPO | Attending: Student in an Organized Health Care Education/Training Program | Admitting: Student in an Organized Health Care Education/Training Program

## 2018-08-26 VITALS — BP 114/76 | HR 68 | Temp 96.6°F | Resp 16 | Ht 71.0 in | Wt 180.0 lb

## 2018-08-26 DIAGNOSIS — M5386 Other specified dorsopathies, lumbar region: Secondary | ICD-10-CM | POA: Diagnosis not present

## 2018-08-26 DIAGNOSIS — G894 Chronic pain syndrome: Secondary | ICD-10-CM | POA: Insufficient documentation

## 2018-08-26 DIAGNOSIS — M7501 Adhesive capsulitis of right shoulder: Secondary | ICD-10-CM | POA: Diagnosis not present

## 2018-08-26 DIAGNOSIS — M25611 Stiffness of right shoulder, not elsewhere classified: Secondary | ICD-10-CM | POA: Diagnosis not present

## 2018-08-26 DIAGNOSIS — G8929 Other chronic pain: Secondary | ICD-10-CM | POA: Diagnosis not present

## 2018-08-26 DIAGNOSIS — M961 Postlaminectomy syndrome, not elsewhere classified: Secondary | ICD-10-CM | POA: Insufficient documentation

## 2018-08-26 DIAGNOSIS — M19072 Primary osteoarthritis, left ankle and foot: Secondary | ICD-10-CM | POA: Diagnosis not present

## 2018-08-26 DIAGNOSIS — T85192S Other mechanical complication of implanted electronic neurostimulator (electrode) of spinal cord, sequela: Secondary | ICD-10-CM | POA: Insufficient documentation

## 2018-08-26 DIAGNOSIS — M47816 Spondylosis without myelopathy or radiculopathy, lumbar region: Secondary | ICD-10-CM | POA: Diagnosis not present

## 2018-08-26 DIAGNOSIS — M5417 Radiculopathy, lumbosacral region: Secondary | ICD-10-CM | POA: Insufficient documentation

## 2018-08-26 DIAGNOSIS — S43004A Unspecified dislocation of right shoulder joint, initial encounter: Secondary | ICD-10-CM | POA: Diagnosis not present

## 2018-08-26 DIAGNOSIS — M5126 Other intervertebral disc displacement, lumbar region: Secondary | ICD-10-CM | POA: Diagnosis not present

## 2018-08-26 DIAGNOSIS — M5442 Lumbago with sciatica, left side: Secondary | ICD-10-CM | POA: Diagnosis not present

## 2018-08-26 DIAGNOSIS — M6281 Muscle weakness (generalized): Secondary | ICD-10-CM | POA: Diagnosis not present

## 2018-08-26 MED ORDER — OXYCODONE-ACETAMINOPHEN 10-325 MG PO TABS: 1.0000 | ORAL_TABLET | Freq: Four times a day (QID) | ORAL | 0 refills | Status: DC | PRN

## 2018-08-26 NOTE — Progress Notes (Signed)
Patient's Name: James Hood  MRN: [id number]  Referring Provider: Leone Haven, MD  DOB: 07-27-1940  PCP: Leone Haven, MD  DOS: 08/26/2018  Note by: [name], MD  Service setting: Ambulatory outpatient  Specialty: Interventional Pain Management  Location: [facility] (AMB) Pain Management Facility    Patient type: Established   Primary Reason(s) for Visit: Encounter for prescription drug management. (Level of risk: moderate)  CC: Ankle Pain (left)  HPI  James Hood is a 78 y.o. year old, male patient, who comes today for a medication management evaluation. He has Hypertension; Tremor, essential; Primary localized osteoarthritis of right knee; Anxiety and depression; DJD (degenerative joint disease) of knee; Trochanteric bursitis of left hip; Headache; Falls; Barrett's esophagus; Chronic lumbar pain; Benign prostatic hyperplasia; Benign essential tremor; Benign neoplasm of colon; Chronic diarrhea; Type 2 diabetes mellitus ([place]); Difficulty in walking; HLD (hyperlipidemia); Amnesia; Testicular hypofunction; Absence of sensation; Episode of syncope; Hypertriglyceridemia; Anemia; Lumbar herniated disc; Night sweats; Right hip pain; Purpura ([place]); Thyroid nodule; Fatty liver; S/P insertion of spinal cord stimulator; Chronic pain syndrome; Failed back surgical syndrome; Lumbosacral radiculopathy; Primary osteoarthritis of left ankle; Facet arthritis of lumbar region; Upper respiratory infection; and Wound infection complicating hardware ([place]) on their problem list. His primarily concern today is the Ankle Pain (left)  Pain Assessment: Location: Left Ankle Radiating: denies Onset: More than a month ago Duration: Chronic pain Quality: Aching Severity: 2 /10 (subjective, self-reported pain score)  Note: Reported level is compatible with observation.                         When using our objective Pain Scale, levels between 6 and 10/10 are said to belong in an emergency room, as it  progressively worsens from a 6/10, described as severely limiting, requiring emergency care not usually available at an outpatient pain management facility. At a 6/10 level, communication becomes difficult and requires great effort. Assistance to reach the emergency department may be required. Facial flushing and profuse sweating along with potentially dangerous increases in heart rate and blood pressure will be evident. Effect on ADL:   Timing: Intermittent Modifying factors: rest BP: 114/76  HR: 68  James Hood was last scheduled for an appointment on 06/17/2018 for medication management. During today's appointment we reviewed James Hood's chronic pain status, as well as his outpatient medication regimen.   No changes in medical history, chronic pain at baseline.  Continues to endorse left ankle pain that is related to arthritis and degenerative ankle changes.  The patient  reports no history of drug use. His body mass index is 25.1 kg/m.  Further details on both, my assessment(s), as well as the proposed treatment plan, please see below.  Controlled Substance Pharmacotherapy Assessment REMS (Risk Evaluation and Mitigation Strategy)   08/01/2018  1   06/17/2018  Oxycodone-Acetaminophen 10-325  120.00 30 Bi Lat   [id number]   Har ([id number])   0  60.00 MME  Medicare   [place]    Landis Martins, RN  08/26/2018  9:02 AM  Sign when Signing Visit Nursing Pain Medication Assessment:  Safety precautions to be maintained throughout the outpatient stay will include: orient to surroundings, keep bed in low position, maintain call bell within reach at all times, provide assistance with transfer out of bed and ambulation.  Medication Inspection Compliance: Pill count conducted under aseptic conditions, in front of the patient. Neither the pills nor the bottle was removed from the patient's sight  at any time. Once count was completed pills were immediately returned to the patient in their original  bottle.  Medication: Oxycodone/APAP Pill/Patch Count: 44 of 120 pills remain Pill/Patch Appearance: Markings consistent with prescribed medication Bottle Appearance: Standard pharmacy container. Clearly labeled. Filled Date: 03/13 / 2020 Last Medication intake:  Today   Pharmacokinetics: Liberation and absorption (onset of action): WNL Distribution (time to peak effect): WNL Metabolism and excretion (duration of action): WNL         Pharmacodynamics: Desired effects: Analgesia: James Hood reports >50% benefit. Functional ability: Patient reports that medication allows him to accomplish basic ADLs Clinically meaningful improvement in function (CMIF): Sustained CMIF goals met Perceived effectiveness: Described as relatively effective, allowing for increase in activities of daily living (ADL) Undesirable effects: Side-effects or Adverse reactions: None reported Monitoring: [place] PMP: Online review of the past 84-monthperiod conducted. Compliant with practice rules and regulations Last UDS on record: Summary  Date Value Ref Range Status  04/15/2018 FINAL  Final    Comment:    ==================================================================== TOXASSURE SELECT 13 (MW) ==================================================================== Test                             Result       Flag       Units Drug Present and Declared for Prescription Verification   Oxycodone                      133          EXPECTED   ng/mg creat   Oxymorphone                    70           EXPECTED   ng/mg creat   Noroxycodone                   1417         EXPECTED   ng/mg creat   Noroxymorphone                 83           EXPECTED   ng/mg creat    Sources of oxycodone are scheduled prescription medications.    Oxymorphone, noroxycodone, and noroxymorphone are expected    metabolites of oxycodone. Oxymorphone is also available as a    scheduled prescription medication.   Phenobarbital                   PRESENT      EXPECTED    Phenobarbital is an expected metabolite of primidone;    Phenobarbital may also be administered as a prescription drug. ==================================================================== Test                      Result    Flag   Units      Ref Range   Creatinine              76               mg/dL      >=20 ==================================================================== Declared Medications:  The flagging and interpretation on this report are based on the  following declared medications.  Unexpected results may arise from  inaccuracies in the declared medications.  **Note: The testing scope of this panel includes these medications:  Oxycodone (Oxycodone Acetaminophen)  Primidone (Mysoline)  **Note: The testing scope of  this panel does not include following  reported medications:  Acetaminophen (Oxycodone Acetaminophen)  Aspirin (Aspirin 81)  Atropine (Lomotil)  Bupropion  Cefepime  Cyclobenzaprine  Diphenoxylate (Lomotil)  Divalproex (Depakote)  Donepezil (Aricept)  Fenofibrate (Tricor)  Finasteride  Gabapentin  Hydrochlorothiazide (Prinzide)  Lisinopril (Prinzide)  Melatonin  Memantine  Metformin  Metoprolol  Multivitamin  Omeprazole  Quetiapine  Simvastatin  Sitagliptin (Januvia)  Tamsulosin ==================================================================== For clinical consultation, please call [phone]. ====================================================================    UDS interpretation: Compliant          Medication Assessment Form: Reviewed. Patient indicates being compliant with therapy Treatment compliance: Compliant Risk Assessment Profile: Aberrant behavior: See initial evaluations. None observed or detected today Comorbid factors increasing risk of overdose: See initial evaluation. No additional risks detected today Opioid risk tool (ORT):  Opioid Risk  06/17/2018  Alcohol 0  Illegal Drugs 0  Rx Drugs 0   Alcohol 3  Illegal Drugs 0  Rx Drugs 0  Age between 16-45 years  0  History of Preadolescent Sexual Abuse 0  Psychological Disease 0  ADD -  OCD -  Bipolar -  Depression 0  Opioid Risk Tool Scoring 3  Opioid Risk Interpretation Low Risk    ORT Scoring interpretation table:  Score <3 = Low Risk for SUD  Score between 4-7 = Moderate Risk for SUD  Score >8 = High Risk for Opioid Abuse   Risk of substance use disorder (SUD): Low  Risk Mitigation Strategies:  Patient Counseling: Covered Patient-Prescriber Agreement (PPA): Present and active  Notification to other healthcare providers: Done  Pharmacologic Plan: No change in therapy, at this time.             Laboratory Chemistry  Inflammation Markers (CRP: Acute Phase) (ESR: Chronic Phase) Lab Results  Component Value Date   ESRSEDRATE 11 01/29/2017                         Rheumatology Markers No results found for: RF, ANA, LABURIC, URICUR, LYMEIGGIGMAB, LYMEABIGMQN, HLAB27                      Renal Function Markers Lab Results  Component Value Date   BUN 34 (H) 03/19/2018   CREATININE 1.32 03/19/2018   GFRAA >60 08/15/2017   GFRNONAA >60 08/15/2017                             Hepatic Function Markers Lab Results  Component Value Date   AST 20 09/25/2017   ALT 13 09/25/2017   ALBUMIN 3.9 09/25/2017   ALKPHOS 32 (L) 09/25/2017   AMMONIA 14 08/09/2016                        Electrolytes Lab Results  Component Value Date   NA 136 03/19/2018   K 4.8 03/19/2018   CL 99 03/19/2018   CALCIUM 9.7 03/19/2018                        Neuropathy Markers Lab Results  Component Value Date   VITAMINB12 433 03/19/2018   FOLATE 13.3 03/19/2018   HGBA1C 7.5 (H) 03/19/2018   HIV NONREACTIVE 06/19/2016                        CNS Tests No results found for: COLORCSF, APPEARCSF, RBCCOUNTCSF, WBCCSF,  POLYSCSF, LYMPHSCSF, EOSCSF, PROTEINCSF, GLUCCSF, JCVIRUS, CSFOLI, IGGCSF, LABACHR, ACETBL                      Bone  Pathology Markers No results found for: VD25OH, [id number], [id number], [id number], 25OHVITD1, 25OHVITD2, 25OHVITD3, TESTOFREE, TESTOSTERONE                       Coagulation Parameters Lab Results  Component Value Date   INR 0.91 04/01/2018   LABPROT 12.2 04/01/2018   APTT 35 04/01/2018   PLT 313 04/23/2018                        Cardiovascular Markers Lab Results  Component Value Date   CKTOTAL 468 (H) 08/14/2016   HGB 10.5 (L) 04/23/2018   HCT 30.2 (L) 04/23/2018                         ID Markers Lab Results  Component Value Date   HIV NONREACTIVE 06/19/2016                        CA Markers No results found for: CEA, CA125, LABCA2                      Endocrine Markers Lab Results  Component Value Date   TSH 2.75 02/07/2018                        Note: Lab results reviewed.  Recent Diagnostic Imaging Results  [place] EKG SITE RITE If Site Rite image not attached, placement could not be confirmed due to  current cardiac rhythm.  Complexity Note: Imaging results reviewed. Results shared with James Hood, using Layman's terms.                               Meds   Current Outpatient Medications:  .  aspirin EC 81 MG tablet, Take 81 mg by mouth daily., Disp: , Rfl:  .  buPROPion (WELLBUTRIN SR) 150 MG 12 hr tablet, TAKE ONE TABLET BY MOUTH TWICE A DAY, Disp: 180 tablet, Rfl: 0 .  cyclobenzaprine (FLEXERIL) 10 MG tablet, Take 1 tablet (10 mg total) by mouth 3 (three) times daily as needed for muscle spasms., Disp: 60 tablet, Rfl: 2 .  diphenoxylate-atropine (LOMOTIL) 2.5-0.025 MG tablet, Take 1 tablet by mouth 2 (two) times daily. , Disp: , Rfl:  .  divalproex (DEPAKOTE ER) 500 MG 24 hr tablet, Take 1 tablet by mouth 2 (two) times daily., Disp: , Rfl:  .  donepezil (ARICEPT) 10 MG tablet, Take 10 mg by mouth at bedtime., Disp: , Rfl:  .  fenofibrate (TRICOR) 145 MG tablet, TAKE ONE TABLET BY MOUTH DAILY, Disp: 90 tablet, Rfl: 0 .  finasteride (PROSCAR) 5 MG tablet,  TAKE ONE TABLET BY MOUTH DAILY, Disp: 30 tablet, Rfl: 1 .  gabapentin (NEURONTIN) 600 MG tablet, TAKE TWO TABLETS BY MOUTH THREE TIMES A DAY, Disp: 180 tablet, Rfl: 0 .  lisinopril-hydrochlorothiazide (PRINZIDE,ZESTORETIC) 20-25 MG tablet, TAKE ONE TABLET BY MOUTH TWICE A DAY, Disp: 180 tablet, Rfl: 0 .  Melatonin 10 MG TABS, Take 20 mg by mouth at bedtime., Disp: , Rfl:  .  memantine (NAMENDA) 10 MG tablet, Take 10 mg by mouth 2 (two) times daily. , Disp: ,  Rfl:  .  metFORMIN (GLUCOPHAGE) 500 MG tablet, TAKE TWO TABLETS BY MOUTH TWICE A DAY WITH A MEAL, Disp: 360 tablet, Rfl: 0 .  metoprolol succinate (TOPROL-XL) 25 MG 24 hr tablet, Take 25 mg by mouth at bedtime. , Disp: , Rfl:  .  Multiple Vitamins-Minerals (CENTRUM SILVER PO), Take 1 tablet by mouth daily., Disp: , Rfl:  .  omeprazole (PRILOSEC) 20 MG capsule, Take 20 mg by mouth 2 (two) times daily before a meal. , Disp: , Rfl:  .  [START ON 08/31/2018] oxyCODONE-acetaminophen (PERCOCET) 10-325 MG tablet, Take 1 tablet by mouth every 6 (six) hours as needed for up to 30 days for pain., Disp: 120 tablet, Rfl: 0 .  primidone (MYSOLINE) 50 MG tablet, Take 150-250 mg by mouth 2 (two) times daily. Take 250 mg by mouth in the morning & take 150 mg at night., Disp: , Rfl:  .  QUEtiapine (SEROQUEL) 25 MG tablet, Take 250 mg by mouth at bedtime. , Disp: , Rfl:  .  simvastatin (ZOCOR) 40 MG tablet, Take 1 tablet (40 mg total) by mouth daily., Disp: 90 tablet, Rfl: 0 .  sitaGLIPtin (JANUVIA) 100 MG tablet, Take 1 tablet (100 mg total) by mouth daily., Disp: 30 tablet, Rfl: 2 .  tamsulosin (FLOMAX) 0.4 MG CAPS capsule, TAKE ONE CAPSULE BY MOUTH DAILY AFTER SUPPER, Disp: 90 capsule, Rfl: 1 .  [START ON 09/30/2018] oxyCODONE-acetaminophen (PERCOCET) 10-325 MG tablet, Take 1 tablet by mouth every 6 (six) hours as needed for up to 30 days for pain. Must last 30 days., Disp: 120 tablet, Rfl: 0 .  [START ON 10/30/2018] oxyCODONE-acetaminophen (PERCOCET) 10-325 MG  tablet, Take 1 tablet by mouth every 6 (six) hours as needed for up to 30 days for pain. Must last 30 days., Disp: 120 tablet, Rfl: 0  ROS  Constitutional: Denies any fever or chills Gastrointestinal: No reported hemesis, hematochezia, vomiting, or acute GI distress Musculoskeletal: Denies any acute onset joint swelling, redness, loss of ROM, or weakness Neurological: No reported episodes of acute onset apraxia, aphasia, dysarthria, agnosia, amnesia, paralysis, loss of coordination, or loss of consciousness  Allergies  James Hood has No Known Allergies.  [place]  Drug: James Hood  reports no history of drug use. Alcohol:  reports previous alcohol use. Tobacco:  reports that he has been smoking cigarettes. He has a 30.00 pack-year smoking history. He has never used smokeless tobacco. 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), Colon polyps, Dementia ([place]), Depression, Diabetes mellitus type 2 in nonobese ([place]), GERD (gastroesophageal reflux disease), Headache, 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[place]), History of hiatal hernia, Hypercholesteremia, Hypertension, Hyperthyroidism, Neuropathic pain, Primary localized osteoarthritis of right knee, Stomach ulcer, and Tremor, essential. Surgical: James Hood  has a past surgical history that includes esophageal stretch; Tonsillectomy; Total knee arthroplasty (Right, 11/15/2014); Lumbar laminectomy/decompression microdiscectomy (Left, 02/03/2016); Colonoscopy with propofol (N/A, 09/14/2016); Joint replacement (Right, 2016); Pulse generator implant (N/A, 08/21/2017); Back surgery; and Pulse generator implant (Right, 04/02/2018). Family: family history includes Depression in his father; Heart attack in his mother; Heart disease in his mother; Hypertension in his father.  Constitutional Exam  General appearance: Well nourished, well  developed, and well hydrated. In no apparent acute distress Vitals:   08/26/18 0850  BP: 114/76  Pulse: 68  Resp: 16  Temp: (!) 96.6 F (35.9 C)  TempSrc: Temporal  SpO2: 100%  Weight: 180 lb (81.6  kg)  Height: _0  (1.803 m)   BMI Assessment: Estimated body mass index is 25.1 kg/m as calculated from the following:   Height as of this encounter: _1  (1.803 m).   Weight as of this encounter: 180 lb (81.6 kg).  BMI interpretation table: BMI level Category Range association with higher incidence of chronic pain  <18 kg/m2 Underweight   18.5-24.9 kg/m2 Ideal body weight   25-29.9 kg/m2 Overweight Increased incidence by 20%  30-34.9 kg/m2 Obese (Class I) Increased incidence by 68%  35-39.9 kg/m2 Severe obesity (Class II) Increased incidence by 136%  >40 kg/m2 Extreme obesity (Class III) Increased incidence by 254%   Patient's current BMI Ideal Body weight  Body mass index is 25.1 kg/m. Ideal body weight: 75.3 kg (166 lb 0.1 oz) Adjusted ideal body weight: 77.8 kg (171 lb 9.7 oz)   BMI Readings from Last 4 Encounters:  08/26/18 25.10 kg/m  06/17/18 25.10 kg/m  04/15/18 25.10 kg/m  04/15/18 25.10 kg/m   Wt Readings from Last 4 Encounters:  08/26/18 180 lb (81.6 kg)  06/17/18 180 lb (81.6 kg)  04/15/18 180 lb (81.6 kg)  04/15/18 180 lb (81.6 kg)  Psych/Mental status: Alert, oriented x 3 (person, place, & time)       Eyes: PERLA Respiratory: No evidence of acute respiratory distress  Cervical Spine Area Exam  Skin & Axial Inspection: No masses, redness, edema, swelling, or associated skin lesions Alignment: Symmetrical Functional ROM: Unrestricted ROM      Stability: No instability detected Muscle Tone/Strength: Functionally intact. No obvious neuro-muscular anomalies detected. Sensory (Neurological): Unimpaired Palpation: No palpable anomalies              Upper Extremity (UE) Exam    Side: Right upper extremity  Side: Left upper extremity  Skin & Extremity  Inspection: Skin color, temperature, and hair growth are WNL. No peripheral edema or cyanosis. No masses, redness, swelling, asymmetry, or associated skin lesions. No contractures.  Skin & Extremity Inspection: Skin color, temperature, and hair growth are WNL. No peripheral edema or cyanosis. No masses, redness, swelling, asymmetry, or associated skin lesions. No contractures.  Functional ROM: Unrestricted ROM          Functional ROM: Unrestricted ROM          Muscle Tone/Strength: Functionally intact. No obvious neuro-muscular anomalies detected.  Muscle Tone/Strength: Functionally intact. No obvious neuro-muscular anomalies detected.  Sensory (Neurological): Unimpaired          Sensory (Neurological): Unimpaired          Palpation: No palpable anomalies              Palpation: No palpable anomalies              Provocative Test(s):  Phalen's test: deferred Tinel's test: deferred Apley's scratch test (touch opposite shoulder):  Action 1 (Across chest): deferred Action 2 (Overhead): deferred Action 3 (LB reach): deferred   Provocative Test(s):  Phalen's test: deferred Tinel's test: deferred Apley's scratch test (touch opposite shoulder):  Action 1 (Across chest): deferred Action 2 (Overhead): deferred Action 3 (LB reach): deferred    Thoracic Spine Area Exam  Skin & Axial Inspection: No masses, redness, or swelling Alignment: Symmetrical Functional ROM: Unrestricted ROM Stability: No instability detected Muscle Tone/Strength: Functionally intact. No obvious neuro-muscular anomalies detected. Sensory (Neurological): Unimpaired Muscle strength & Tone: No palpable anomalies  Lumbar Spine Area Exam  Skin & Axial Inspection: Well healed scar from previous spine surgery detected Alignment: Symmetrical Functional  ROM: Decreased ROM       Stability: No instability detected Muscle Tone/Strength: Functionally intact. No obvious neuro-muscular anomalies detected. Sensory (Neurological):  Dermatomal pain pattern and musculoskeletal Palpation: No palpable anomalies       Provocative Tests: Hyperextension/rotation test: (+) due to pain. Lumbar quadrant test (Kemp's test): deferred today       Lateral bending test: deferred today       Patrick's Maneuver: deferred today                   FABER* test: deferred today                   S-I anterior distraction/compression test: deferred today         S-I lateral compression test: deferred today         S-I Thigh-thrust test: deferred today         S-I Gaenslen's test: deferred today         *(Flexion, ABduction and External Rotation)  Gait & Posture Assessment  Ambulation: Limited Gait: Antalgic Posture: WNL   Lower Extremity Exam    Side: Right lower extremity  Side: Left lower extremity  Stability: No instability observed          Stability: No instability observed          Skin & Extremity Inspection: Skin color, temperature, and hair growth are WNL. No peripheral edema or cyanosis. No masses, redness, swelling, asymmetry, or associated skin lesions. No contractures.  Skin & Extremity Inspection: Skin color, temperature, and hair growth are WNL. No peripheral edema or cyanosis. No masses, redness, swelling, asymmetry, or associated skin lesions. No contractures.  Functional ROM: Unrestricted ROM                  Functional ROM: Decreased ROM for left ankle                  Muscle Tone/Strength: Functionally intact. No obvious neuro-muscular anomalies detected.  Muscle Tone/Strength: Functionally intact. No obvious neuro-muscular anomalies detected.  Sensory (Neurological): Unimpaired        Sensory (Neurological): Arthropathic arthralgia        DTR: Patellar: deferred today Achilles: deferred today Plantar: deferred today  DTR: Patellar: deferred today Achilles: 1+: trace Plantar: deferred today  Palpation: No palpable anomalies  Palpation: No palpable anomalies   Assessment   Status Diagnosis   Controlled Controlled Controlled 1. Failed back surgical syndrome   2. Chronic left-sided low back pain with left-sided sciatica   3. Lumbosacral radiculopathy   4. Chronic pain syndrome   5. Primary osteoarthritis of left ankle   6. 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 Facet arthritis of lumbar region   8. Sciatica of left side associated with disorder of lumbar spine   9. Displacement of lumbar intervertebral disc without myelopathy     General Recommendations: The pain condition that the patient suffers from is best treated with a multidisciplinary approach that involves an increase in physical activity to prevent de-conditioning and worsening of the pain cycle, as well as psychological counseling (formal and/or informal) to address the co-morbid psychological affects of pain. Treatment will often involve judicious use of pain medications and interventional procedures to decrease the pain, allowing the patient to participate in the physical activity that will ultimately produce long-lasting pain reductions. The goal of the multidisciplinary approach is to return the patient to a higher level of overall  function and to restore their ability to perform activities of daily living.  Medication refill for chronic pain secondary to failed back surgical syndrome, status post laminectomy, status post spinal cord stimulator trial status post SCS explant secondary to hardware infection.  Patient does have chronic lumbosacral radiculopathy on the left.  He finds his increased dose of Percocet helpful in managing his pain and also assisting with ADLs.  I informed patient that no further dose escalation beyond this.  Could also consider opioid rotation if patient experiences decrease in effectiveness.  PDMP checked and up-to-date.  UDS up-to-date.  Refill of medications as below for 3 months given Covid situation.  Continue multimodal analgesics including  Flexeril 10 mg TID daily as needed, gabapentin and ibuprofen as needed.   Plan of Care  Pharmacotherapy (Medications Ordered): Meds ordered this encounter  Medications  . oxyCODONE-acetaminophen (PERCOCET) 10-325 MG tablet    Sig: Take 1 tablet by mouth every 6 (six) hours as needed for up to 30 days for pain.    Dispense:  120 tablet    Refill:  0    Do not place this medication, or any other prescription from our practice, on "Automatic Refill". Patient may have prescription filled one day early if pharmacy is closed on scheduled refill date.  Marland Kitchen oxyCODONE-acetaminophen (PERCOCET) 10-325 MG tablet    Sig: Take 1 tablet by mouth every 6 (six) hours as needed for up to 30 days for pain. Must last 30 days.    Dispense:  120 tablet    Refill:  0    [place] STOP ACT - Not applicable. Fill one day early if pharmacy is closed on scheduled refill date.  Marland Kitchen oxyCODONE-acetaminophen (PERCOCET) 10-325 MG tablet    Sig: Take 1 tablet by mouth every 6 (six) hours as needed for up to 30 days for pain. Must last 30 days.    Dispense:  120 tablet    Refill:  0    [place] STOP ACT - Not applicable. Fill one day early if pharmacy is closed on scheduled refill date.   Time Note: Greater than 50% of the 25 minute(s) of face-to-face time spent with James Hood, was spent in counseling/coordination of care regarding: James Hood's primary cause of pain, the treatment plan, treatment alternatives, medication side effects, the opioid analgesic risks and possible complications, realistic expectations, the medication agreement and the patient's responsibilities when it comes to controlled substances.  Provider-requested follow-up: Return in about 3 months (around 11/25/2018) for Medication Management.  Future Appointments  Date Time Provider [place]  09/24/2018  8:00 AM Leone Haven, MD [facility]  11/17/2018  9:00 AM Dia Crawford, LPN [facility]  0/[id number]  9:30 AM Leone Haven, MD  [facility]  11/25/2018  8:30 AM [name], MD [facility] None    Primary Care Physician: Leone Haven, MD Location: [facility] Note by: [name], M.D Date: 08/26/2018; Time: 9:34 AM  There are no Patient Instructions on file for this visit.

## 2018-08-26 NOTE — Progress Notes (Signed)
Nursing Pain Medication Assessment:  Safety precautions to be maintained throughout the outpatient stay will include: orient to surroundings, keep bed in low position, maintain call bell within reach at all times, provide assistance with transfer out of bed and ambulation.  Medication Inspection Compliance: Pill count conducted under aseptic conditions, in front of the patient. Neither the pills nor the bottle was removed from the patient's sight at any time. Once count was completed pills were immediately returned to the patient in their original bottle.  Medication: Oxycodone/APAP Pill/Patch Count: 44 of 120 pills remain Pill/Patch Appearance: Markings consistent with prescribed medication Bottle Appearance: Standard pharmacy container. Clearly labeled. Filled Date: 03/13 / 2020 Last Medication intake:  Today

## 2018-09-14 ENCOUNTER — Other Ambulatory Visit: Payer: Self-pay | Admitting: Family Medicine

## 2018-09-16 ENCOUNTER — Other Ambulatory Visit: Payer: Self-pay | Admitting: Family Medicine

## 2018-09-18 ENCOUNTER — Other Ambulatory Visit: Payer: Self-pay | Admitting: Family Medicine

## 2018-09-23 ENCOUNTER — Other Ambulatory Visit: Payer: Self-pay | Admitting: Internal Medicine

## 2018-09-24 ENCOUNTER — Telehealth: Payer: Self-pay | Admitting: Family Medicine

## 2018-09-24 ENCOUNTER — Encounter: Payer: Self-pay | Admitting: Family Medicine

## 2018-09-24 ENCOUNTER — Ambulatory Visit (INDEPENDENT_AMBULATORY_CARE_PROVIDER_SITE_OTHER): Payer: PPO | Admitting: Family Medicine

## 2018-09-24 ENCOUNTER — Other Ambulatory Visit: Payer: Self-pay

## 2018-09-24 DIAGNOSIS — I10 Essential (primary) hypertension: Secondary | ICD-10-CM

## 2018-09-24 DIAGNOSIS — K529 Noninfective gastroenteritis and colitis, unspecified: Secondary | ICD-10-CM | POA: Diagnosis not present

## 2018-09-24 DIAGNOSIS — G8929 Other chronic pain: Secondary | ICD-10-CM

## 2018-09-24 DIAGNOSIS — E785 Hyperlipidemia, unspecified: Secondary | ICD-10-CM

## 2018-09-24 DIAGNOSIS — M545 Low back pain, unspecified: Secondary | ICD-10-CM

## 2018-09-24 DIAGNOSIS — E1142 Type 2 diabetes mellitus with diabetic polyneuropathy: Secondary | ICD-10-CM | POA: Diagnosis not present

## 2018-09-24 DIAGNOSIS — G629 Polyneuropathy, unspecified: Secondary | ICD-10-CM | POA: Diagnosis not present

## 2018-09-24 NOTE — Assessment & Plan Note (Signed)
Adequately controlled.  Continue gabapentin.

## 2018-09-24 NOTE — Assessment & Plan Note (Signed)
Asymptomatic. He will monitor. ?

## 2018-09-24 NOTE — Assessment & Plan Note (Signed)
Lab work to be completed in 1 month.  Continue current medication.

## 2018-09-24 NOTE — Assessment & Plan Note (Signed)
Continue current medications.  We will have him come back in 1 month for nurse BP check.

## 2018-09-24 NOTE — Progress Notes (Signed)
Virtual Visit via video Note  This visit type was conducted due to national recommendations for restrictions regarding the COVID-19 pandemic (e.g. social distancing).  This format is felt to be most appropriate for this patient at this time.  All issues noted in this document were discussed and addressed.  No physical exam was performed (except for noted visual exam findings with Video Visits).   I connected with James Hood today at  8:00 AM EDT by a video enabled telemedicine application and verified that I am speaking with the correct person using two identifiers. Location patient: home Location provider: work Persons participating in the virtual visit: patient, provider  I discussed the limitations, risks, security and privacy concerns of performing an evaluation and management service by telephone and the availability of in person appointments. I also discussed with the patient that there may be a patient responsible charge related to this service. The patient expressed understanding and agreed to proceed.  Reason for visit: follow-up  HPI: HYPERTENSION Disease Monitoring: Blood pressure range-notes it is good though can not give me any numbers Chest pain- no      Dyspnea- no Medications: Compliance- taking lisinopril/hctz, metoprolol   Edema- no  DIABETES Disease Monitoring: Blood Sugar ranges-not checking Polyuria/phagia/dipsia- no      Optho- UTD Medications: Compliance- taking januvia and metformin Hypoglycemic symptoms- no  HYPERLIPIDEMIA Disease Monitoring: See symptoms for Hypertension Medications: Compliance- taking simvastatin, fenofibrate Right upper quadrant pain- no  Muscle aches- no  Chronic diarrhea: Patient has not had any recent issues with this.  Chronic back pain: Patient notes this is doing quite a bit better after having his spinal cord stimulator removed.  He has been following with pain management and they increased his oxycodone dose as well.  He  currently has no back pain or sciatica.  Neuropathy: Patient does have some neuropathy about once a week typically at night and when he wakes up the next day it is gone.  He is taking gabapentin with good benefit.    ROS: See pertinent positives and negatives per HPI.  Past Medical History:  Diagnosis Date  . Anxiety   . Anxiety and depression   . Arthritis   . BPH (benign prostatic hyperplasia)   . 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 Colon polyps   . Dementia ([place])   . Depression   . Diabetes mellitus type 2 in nonobese (HCC)   . GERD (gastroesophageal reflux disease)    BARRETTS ESOPHAGUS RESOLVED PER PATIENT  . Headache   . 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[place])   . History of hiatal hernia   . Hypercholesteremia   . Hypertension   . Hyperthyroidism   . Neuropathic pain   . Primary localized osteoarthritis of right knee   . Stomach ulcer   . Tremor, essential     Past Surgical History:  Procedure Laterality Date  . BACK SURGERY    . COLONOSCOPY WITH PROPOFOL N/A 09/14/2016   Procedure: COLONOSCOPY WITH PROPOFOL;  Surgeon: Manya Silvas, MD;  Location: [facility] ENDOSCOPY;  Service: Endoscopy;  Laterality: N/A;  . esophageal stretch    . JOINT REPLACEMENT Right 2016   knee  . LUMBAR LAMINECTOMY/DECOMPRESSION MICRODISCECTOMY Left 02/03/2016   Procedure: LEFT L5-S1 DISKECTOMY;  Surgeon: Leeroy Cha, MD;  Location: [place] NEURO ORS;  Service: Neurosurgery;  Laterality: Left;  LEFT L5-S1 DISKECTOMY  . PULSE GENERATOR IMPLANT N/A 08/21/2017   Procedure: UNILATERAL PULSE GENERATOR IMPLANT;  Surgeon: Meade Maw, MD;  Location: [facility] ORS;  Service: Neurosurgery;  Laterality: N/A;  . PULSE GENERATOR IMPLANT Right 04/02/2018   Procedure: REMOVAL OF PULSE GENERATOR IMPLANT AND LEADS;  Surgeon: Meade Maw, MD;  Location: [facility] ORS;  Service: Neurosurgery;  Laterality: Right;  . TONSILLECTOMY    . TOTAL KNEE ARTHROPLASTY Right 11/15/2014   Procedure:  TOTAL KNEE ARTHROPLASTY;  Surgeon: Elsie Saas, MD;  Location: [place];  Service: Orthopedics;  Laterality: Right;    Family History  Problem Relation Age of Onset  . Heart attack Mother   . Heart disease Mother   . Hypertension Father   . Depression Father   . Kidney cancer Neg Hx   . Prostate cancer Neg Hx     SOCIAL HX: Smoker   Current Outpatient Medications:  .  aspirin EC 81 MG tablet, Take 81 mg by mouth daily., Disp: , Rfl:  .  buPROPion (WELLBUTRIN SR) 150 MG 12 hr tablet, TAKE ONE TABLET BY MOUTH TWICE A DAY, Disp: 180 tablet, Rfl: 0 .  cyclobenzaprine (FLEXERIL) 10 MG tablet, Take 1 tablet (10 mg total) by mouth 3 (three) times daily as needed for muscle spasms., Disp: 60 tablet, Rfl: 2 .  diphenoxylate-atropine (LOMOTIL) 2.5-0.025 MG tablet, Take 1 tablet by mouth 2 (two) times daily. , Disp: , Rfl:  .  divalproex (DEPAKOTE ER) 500 MG 24 hr tablet, Take 1 tablet by mouth 2 (two) times daily., Disp: , Rfl:  .  donepezil (ARICEPT) 10 MG tablet, Take 10 mg by mouth at bedtime., Disp: , Rfl:  .  fenofibrate (TRICOR) 145 MG tablet, TAKE ONE TABLET BY MOUTH DAILY, Disp: 90 tablet, Rfl: 0 .  finasteride (PROSCAR) 5 MG tablet, TAKE ONE TABLET BY MOUTH DAILY, Disp: 30 tablet, Rfl: 1 .  gabapentin (NEURONTIN) 600 MG tablet, TAKE TWO TABLETS BY MOUTH THREE TIMES A DAY, Disp: 180 tablet, Rfl: 0 .  lisinopril-hydrochlorothiazide (PRINZIDE,ZESTORETIC) 20-25 MG tablet, TAKE ONE TABLET BY MOUTH TWICE A DAY, Disp: 180 tablet, Rfl: 0 .  Melatonin 10 MG TABS, Take 20 mg by mouth at bedtime., Disp: , Rfl:  .  memantine (NAMENDA) 10 MG tablet, Take 10 mg by mouth 2 (two) times daily. , Disp: , Rfl:  .  metFORMIN (GLUCOPHAGE) 500 MG tablet, TAKE TWO TABLETS BY MOUTH TWICE A DAY WITH MEALS, Disp: 360 tablet, Rfl: 0 .  metoprolol succinate (TOPROL-XL) 25 MG 24 hr tablet, Take 25 mg by mouth at bedtime. , Disp: , Rfl:  .  Multiple Vitamins-Minerals (CENTRUM SILVER PO), Take 1 tablet by mouth  daily., Disp: , Rfl:  .  omeprazole (PRILOSEC) 20 MG capsule, Take 20 mg by mouth 2 (two) times daily before a meal. , Disp: , Rfl:  .  oxyCODONE-acetaminophen (PERCOCET) 10-325 MG tablet, Take 1 tablet by mouth every 6 (six) hours as needed for up to 30 days for pain., Disp: 120 tablet, Rfl: 0 .  [START ON 09/30/2018] oxyCODONE-acetaminophen (PERCOCET) 10-325 MG tablet, Take 1 tablet by mouth every 6 (six) hours as needed for up to 30 days for pain. Must last 30 days., Disp: 120 tablet, Rfl: 0 .  [START ON 10/30/2018] oxyCODONE-acetaminophen (PERCOCET) 10-325 MG tablet, Take 1 tablet by mouth every 6 (six) hours as needed for up to 30 days for pain. Must last 30 days., Disp: 120 tablet, Rfl: 0 .  primidone (MYSOLINE) 50 MG tablet, Take 150-250 mg by mouth 2 (two) times daily. Take 250 mg by mouth in the morning & take 150 mg at night.,  Disp: , Rfl:  .  QUEtiapine (SEROQUEL) 25 MG tablet, Take 250 mg by mouth at bedtime. , Disp: , Rfl:  .  simvastatin (ZOCOR) 40 MG tablet, TAKE ONE TABLET BY MOUTH DAILY, Disp: 90 tablet, Rfl: 0 .  sitaGLIPtin (JANUVIA) 100 MG tablet, Take 1 tablet (100 mg total) by mouth daily., Disp: 30 tablet, Rfl: 2 .  tamsulosin (FLOMAX) 0.4 MG CAPS capsule, TAKE ONE CAPSULE BY MOUTH DAILY AFTER SUPPER, Disp: 90 capsule, Rfl: 1  EXAM:  VITALS per patient if applicable: None  GENERAL: alert, oriented, appears well and in no acute distress  HEENT: atraumatic, conjunttiva clear, no obvious abnormalities on inspection of external nose and ears  NECK: normal movements of the head and neck  LUNGS: on inspection no signs of respiratory distress, breathing rate appears normal, no obvious gross SOB, gasping or wheezing  CV: no obvious cyanosis  MS: moves all visible extremities without noticeable abnormality  PSYCH/NEURO: pleasant and cooperative, no obvious depression or anxiety, speech and thought processing grossly intact  ASSESSMENT AND PLAN:  Discussed the following  assessment and plan:  Essential hypertension - Plan: Comp Met (CMET), TSH  Chronic diarrhea  Type 2 diabetes mellitus with diabetic polyneuropathy, without long-term current use of insulin (HCC) - Plan: Hemoglobin A1c  Chronic low back pain without sciatica, unspecified back pain laterality  Hyperlipidemia, unspecified hyperlipidemia type - Plan: Lipid panel  Neuropathy  Hypertension Continue current medications.  We will have him come back in 1 month for nurse BP check.  Chronic diarrhea Asymptomatic.  He will monitor.  Type 2 diabetes mellitus ([place]) Lab work to be completed in 1 month.  Continue current medication.  Chronic lumbar pain Currently asymptomatic.  He will continue to follow with pain management.  HLD (hyperlipidemia) Continue current medications.  Check lab work in 1 month.  Neuropathy Adequately controlled.  Continue gabapentin.    I discussed the assessment and treatment plan with the patient. The patient was provided an opportunity to ask questions and all were answered. The patient agreed with the plan and demonstrated an understanding of the instructions.   The patient was advised to call back or seek an in-person evaluation if the symptoms worsen or if the condition fails to improve as anticipated.   Tommi Rumps, MD

## 2018-09-24 NOTE — Assessment & Plan Note (Signed)
Continue current medications.  Check lab work in 1 month.

## 2018-09-24 NOTE — Telephone Encounter (Signed)
Please contact the patient and get him set up for lab work in 1 month.  Please get him set up for follow-up with me in 4 to 5 months.  You can cancel his appointment with me that is scheduled for 11/17/2018.

## 2018-09-24 NOTE — Telephone Encounter (Signed)
Done

## 2018-09-24 NOTE — Assessment & Plan Note (Signed)
Currently asymptomatic.  He will continue to follow with pain management.

## 2018-10-11 ENCOUNTER — Other Ambulatory Visit: Payer: Self-pay | Admitting: Family Medicine

## 2018-10-12 ENCOUNTER — Other Ambulatory Visit: Payer: Self-pay | Admitting: Family Medicine

## 2018-10-16 ENCOUNTER — Telehealth: Payer: Self-pay | Admitting: *Deleted

## 2018-10-16 NOTE — Telephone Encounter (Signed)
Called and advised patient according to FDA there is no proof Hydroxychloroquine will present COVID -19, patient has no symptoms of COVID 19

## 2018-10-16 NOTE — Telephone Encounter (Signed)
Copied from [place] [phone]. Topic: General - Inquiry >> Oct 16, 2018 10:04 AM Mathis Bud wrote: Reason for CRM: Patient would like know if medication " hydroxychloroquine " would prevent him to not getting to covid-19.  Patient would like PCP or nurse to give a call back to see if he should be prescribed this.   Call back # [phone]

## 2018-10-30 ENCOUNTER — Other Ambulatory Visit (INDEPENDENT_AMBULATORY_CARE_PROVIDER_SITE_OTHER): Payer: PPO

## 2018-10-30 ENCOUNTER — Other Ambulatory Visit: Payer: Self-pay

## 2018-10-30 DIAGNOSIS — E785 Hyperlipidemia, unspecified: Secondary | ICD-10-CM | POA: Diagnosis not present

## 2018-10-30 DIAGNOSIS — E1142 Type 2 diabetes mellitus with diabetic polyneuropathy: Secondary | ICD-10-CM

## 2018-10-30 DIAGNOSIS — I10 Essential (primary) hypertension: Secondary | ICD-10-CM | POA: Diagnosis not present

## 2018-10-30 LAB — COMPREHENSIVE METABOLIC PANEL
ALT: 14 U/L (ref 0–53)
AST: 18 U/L (ref 0–37)
Albumin: 4.3 g/dL (ref 3.5–5.2)
Alkaline Phosphatase: 38 U/L — ABNORMAL LOW (ref 39–117)
BUN: 25 mg/dL — ABNORMAL HIGH (ref 6–23)
CO2: 29 mEq/L (ref 19–32)
Calcium: 9.7 mg/dL (ref 8.4–10.5)
Chloride: 99 mEq/L (ref 96–112)
Creatinine, Ser: 1.3 mg/dL (ref 0.40–1.50)
GFR: 53.29 mL/min — ABNORMAL LOW (ref >60.00)
Glucose, Bld: 116 mg/dL — ABNORMAL HIGH (ref 70–99)
Potassium: 4.4 mEq/L (ref 3.5–5.1)
Sodium: 137 mEq/L (ref 135–145)
Total Bilirubin: 0.3 mg/dL (ref 0.2–1.2)
Total Protein: 6.3 g/dL (ref 6.0–8.3)

## 2018-10-30 LAB — LIPID PANEL
Cholesterol: 161 mg/dL (ref 0–200)
HDL: 42.1 mg/dL (ref >39.00)
NonHDL: 118.59
Total CHOL/HDL Ratio: 4
Triglycerides: 258 mg/dL — ABNORMAL HIGH (ref 0.0–149.0)
VLDL: 51.6 mg/dL — ABNORMAL HIGH (ref 0.0–40.0)

## 2018-10-30 LAB — TSH: TSH: 2.52 u[IU]/mL (ref 0.35–4.50)

## 2018-10-30 LAB — HEMOGLOBIN A1C: Hgb A1c MFr Bld: 7.2 % — ABNORMAL HIGH (ref 4.6–6.5)

## 2018-10-30 LAB — LDL CHOLESTEROL, DIRECT: Direct LDL: 70 mg/dL

## 2018-11-02 ENCOUNTER — Other Ambulatory Visit: Payer: Self-pay | Admitting: Family Medicine

## 2018-11-06 ENCOUNTER — Telehealth: Payer: Self-pay | Admitting: Family Medicine

## 2018-11-06 DIAGNOSIS — E782 Mixed hyperlipidemia: Secondary | ICD-10-CM

## 2018-11-06 NOTE — Telephone Encounter (Signed)
Please schedule. Patient agreeable to medication change.

## 2018-11-06 NOTE — Telephone Encounter (Signed)
Pt read lab result not by Dr Caryl Bis 11/06/2018. Pt will switch medication to whichever Dr Caryl Bis feels is best. Please advise patient. Pt needs follow up lab.

## 2018-11-10 MED ORDER — ROSUVASTATIN CALCIUM 40 MG PO TABS: 40.0000 mg | ORAL_TABLET | Freq: Every day | ORAL | 1 refills | Status: DC

## 2018-11-10 NOTE — Telephone Encounter (Signed)
Crestor sent to pharmacy.  He will discontinue the simvastatin once he starts the Crestor.  Labs ordered.  He needs to be scheduled for lab work for 2 months from now.  Thanks.

## 2018-11-10 NOTE — Addendum Note (Signed)
Addended by: Leone Haven on: 11/10/2018 05:47 PM   Modules accepted: Orders

## 2018-11-12 NOTE — Telephone Encounter (Signed)
Called and informed the patient to stop the simvastatin and start Crestor which was sent to pharmacy, pt also scheduled with me his 2 month lab appt per provider request.  Nina,cma

## 2018-11-17 ENCOUNTER — Ambulatory Visit: Payer: Self-pay

## 2018-11-17 ENCOUNTER — Ambulatory Visit: Payer: Self-pay | Admitting: Family Medicine

## 2018-11-18 ENCOUNTER — Other Ambulatory Visit: Payer: Self-pay | Admitting: Family Medicine

## 2018-11-24 ENCOUNTER — Encounter: Payer: Self-pay | Admitting: Student in an Organized Health Care Education/Training Program

## 2018-11-25 ENCOUNTER — Encounter: Payer: Self-pay | Admitting: Student in an Organized Health Care Education/Training Program

## 2018-11-25 ENCOUNTER — Ambulatory Visit
Payer: PPO | Attending: Student in an Organized Health Care Education/Training Program | Admitting: Student in an Organized Health Care Education/Training Program

## 2018-11-25 ENCOUNTER — Other Ambulatory Visit: Payer: Self-pay

## 2018-11-25 DIAGNOSIS — M47816 Spondylosis without myelopathy or radiculopathy, lumbar region: Secondary | ICD-10-CM | POA: Diagnosis not present

## 2018-11-25 DIAGNOSIS — G8929 Other chronic pain: Secondary | ICD-10-CM | POA: Diagnosis not present

## 2018-11-25 DIAGNOSIS — M961 Postlaminectomy syndrome, not elsewhere classified: Secondary | ICD-10-CM

## 2018-11-25 DIAGNOSIS — G894 Chronic pain syndrome: Secondary | ICD-10-CM | POA: Diagnosis not present

## 2018-11-25 DIAGNOSIS — M19072 Primary osteoarthritis, left ankle and foot: Secondary | ICD-10-CM

## 2018-11-25 DIAGNOSIS — M5417 Radiculopathy, lumbosacral region: Secondary | ICD-10-CM

## 2018-11-25 DIAGNOSIS — M5386 Other specified dorsopathies, lumbar region: Secondary | ICD-10-CM

## 2018-11-25 DIAGNOSIS — T85192S Other mechanical complication of implanted electronic neurostimulator (electrode) of spinal cord, sequela: Secondary | ICD-10-CM | POA: Diagnosis not present

## 2018-11-25 DIAGNOSIS — M5442 Lumbago with sciatica, left side: Secondary | ICD-10-CM | POA: Diagnosis not present

## 2018-11-25 MED ORDER — OXYCODONE-ACETAMINOPHEN 10-325 MG PO TABS: 1.0000 | ORAL_TABLET | Freq: Four times a day (QID) | ORAL | 0 refills | Status: DC | PRN

## 2018-11-25 NOTE — Progress Notes (Signed)
Pain Management Virtual Encounter Note - Virtual Visit via Telephone Telehealth (real-time audio visits between healthcare provider and patient).   Patient's Phone No. & Preferred Pharmacy:  [phone] (home); [phone] (mobile); (Preferred) [phone] [email or web address]  [place] [place] [address] [place] [address] Phone: [phone] Fax: [phone]    Pre-screening note:  Our staff contacted James Hood and offered him an "in person", "face-to-face" appointment versus a telephone encounter. He indicated preferring the telephone encounter, at this time.   Reason for Virtual Visit: COVID-19*  Social distancing based on CDC and AMA recommendations.   I contacted James Hood on 11/25/2018 via telephone.      I clearly identified myself as [name], MD. I verified that I was speaking with the correct person using two identifiers (Name: James Hood, and date of birth: 16-Aug-1940).  Advanced Informed Consent I sought verbal advanced consent from James Hood for virtual visit interactions. I informed James Hood of possible security and privacy concerns, risks, and limitations associated with providing "not-in-person" medical evaluation and management services. I also informed James Hood of the availability of "in-person" appointments. Finally, I informed him that there would be a charge for the virtual visit and that he could be  personally, fully or partially, financially responsible for it. James Hood expressed understanding and agreed to proceed.   Historic Elements   James Hood is a 78 y.o. year old, male patient evaluated today after his last encounter by our practice on 08/26/2018. 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), History of hiatal hernia, Hypercholesteremia, Hypertension, Hyperthyroidism, Neuropathic pain, Primary localized osteoarthritis of right knee, Stomach ulcer, and Tremor, essential. 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 James Hood has a current medication list which includes the following prescription(s): aspirin ec, bupropion, cyclobenzaprine, diphenoxylate-atropine, divalproex, donepezil, fenofibrate, finasteride, gabapentin, lisinopril-hydrochlorothiazide, melatonin, memantine, metformin, metoprolol succinate, multiple vitamins-minerals, omeprazole, oxycodone-acetaminophen, oxycodone-acetaminophen, oxycodone-acetaminophen, primidone, quetiapine, rosuvastatin, sitagliptin, and tamsulosin. He  reports that he has been smoking cigarettes. He has a 30.00 pack-year smoking history. He has never used smokeless tobacco. He reports previous alcohol use. He reports that he does not use drugs. James Hood has No Known Allergies.   HPI  Today, he is being contacted for medication management.  No change in medical history since last visit.  Increased pain left ankle due to ligamentous injury. Patient continues multimodal pain regimen as prescribed.  States that it provides pain relief and improvement in functional status however he states that he is trying to utilize less Oxycodone. Utilizing TID prn on some days that are more painful, utilizes 4th dose. Will decrease dose to 100#.  Pharmacotherapy Assessment   Analgesic:  11/19/2018  1   08/26/2018  Oxycodone-Acetaminophen 10-325  120.00 30 Bi Lat   [id number]   Har ([id number])   0  60.00 MME  Medicare   [place]    Monitoring: Pharmacotherapy: No side-effects or adverse reactions reported. [place] PMP: PDMP reviewed during this encounter.  Compliance: No problems identified. Effectiveness: Clinically acceptable. Plan: Refer to "POC".  Pertinent Labs   SAFETY SCREENING Profile Lab Results  Component Value Date   STAPHAUREUS NEGATIVE 08/15/2017   MRSAPCR NEGATIVE 08/15/2017   HIV NONREACTIVE 06/19/2016   Renal Function Lab Results  Component Value Date   BUN 25 (H) 10/30/2018   CREATININE 1.30 10/30/2018   GFRAA >60 08/15/2017   GFRNONAA >60 08/15/2017   Hepatic Function Lab Results  Component Value Date   AST 18 10/30/2018   ALT 14 10/30/2018   ALBUMIN 4.3 10/30/2018   UDS Summary  Date Value Ref Range Status  04/15/2018 FINAL  Final    Comment:    ==================================================================== TOXASSURE SELECT 13 (MW) ==================================================================== Test                             Result       Flag       Units Drug Present and Declared for Prescription Verification   Oxycodone                      133          EXPECTED   ng/mg creat   Oxymorphone                    70           EXPECTED   ng/mg creat   Noroxycodone                   1417         EXPECTED   ng/mg creat   Noroxymorphone                 83           EXPECTED   ng/mg creat    Sources of oxycodone are scheduled prescription medications.    Oxymorphone, noroxycodone, and noroxymorphone are expected    metabolites of oxycodone. Oxymorphone is also available as a    scheduled prescription medication.   Phenobarbital                  PRESENT      EXPECTED    Phenobarbital is an expected metabolite of primidone;    Phenobarbital may also be administered as a prescription  drug. ==================================================================== Test                      Result    Flag   Units      Ref Range   Creatinine              76               mg/dL      >=20 ==================================================================== Declared Medications:  The flagging and interpretation on this report are based on the  following declared medications.  Unexpected results may arise from  inaccuracies in the declared medications.  **Note: The testing scope of this panel includes these medications:  Oxycodone (Oxycodone Acetaminophen)  Primidone (Mysoline)  **Note: The testing scope of this panel does not include following  reported medications:  Acetaminophen (Oxycodone Acetaminophen)  Aspirin (Aspirin 81)  Atropine (Lomotil)  Bupropion  Cefepime  Cyclobenzaprine  Diphenoxylate (Lomotil)  Divalproex (Depakote)  Donepezil (Aricept)  Fenofibrate (Tricor)  Finasteride  Gabapentin  Hydrochlorothiazide (Prinzide)  Lisinopril (Prinzide)  Melatonin  Memantine  Metformin  Metoprolol  Multivitamin  Omeprazole  Quetiapine  Simvastatin  Sitagliptin (Januvia)  Tamsulosin ==================================================================== For clinical consultation, please call [phone]. ====================================================================    Note: Above Lab results reviewed.  Recent imaging  [place] EKG SITE RITE If Site Rite image not attached, placement could not be confirmed due to  current cardiac rhythm.  Assessment  The primary encounter diagnosis was Failed back surgical syndrome. 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  Plan of Care  I have changed James Hoods oxyCODONE-acetaminophen. I am also having him start on oxyCODONE-acetaminophen and oxyCODONE-acetaminophen. Additionally, I am having him maintain his donepezil, metoprolol succinate, omeprazole, memantine, diphenoxylate-atropine, sitaGLIPtin, primidone, QUEtiapine, Melatonin, cyclobenzaprine, divalproex, Multiple Vitamins-Minerals (CENTRUM SILVER PO), aspirin EC, tamsulosin, metFORMIN, fenofibrate, gabapentin, lisinopril-hydrochlorothiazide, finasteride, rosuvastatin, and buPROPion.  Pharmacotherapy (Medications Ordered): Meds ordered this encounter  Medications  . oxyCODONE-acetaminophen (PERCOCET) 10-325 MG tablet    Sig: Take 1 tablet by mouth every 6 (six) hours as needed for pain. Must last 30 days.    Dispense:  100 tablet    Refill:  0    [place] STOP ACT - Not applicable. Fill one day early if pharmacy is closed on scheduled refill date.  Marland Kitchen oxyCODONE-acetaminophen (PERCOCET) 10-325 MG tablet    Sig: Take 1 tablet by mouth every 6 (six) hours as needed for pain. Must last 30 days.    Dispense:  100 tablet    Refill:  0     STOP ACT - Not applicable. Fill one day early if pharmacy is closed on scheduled refill date.  Marland Kitchen oxyCODONE-acetaminophen (PERCOCET) 10-325 MG tablet    Sig: Take 1 tablet by mouth every 6 (six) hours as needed for pain. Must last 30 days.    Dispense:  100 tablet    Refill:  0     STOP ACT - Not applicable. Fill one day early if pharmacy is closed on scheduled refill date.    Follow-up plan:   Return in about 15 weeks (around 03/10/2019) for Medication Management.      Recent Visits No visits were found meeting these conditions.  Showing recent visits within past 90 days and meeting all other requirements   Today's Visits Date Type Provider Dept  11/25/18 Office Visit [name], MD [facility]  Showing today's visits and meeting all other requirements    Future Appointments No visits were found meeting these conditions.  Showing future appointments within next 90 days and meeting all other requirements   I discussed the assessment and treatment plan with the patient. The patient was provided an opportunity to ask questions and all were answered. The patient agreed with the plan and demonstrated an understanding of the instructions.  Patient advised to call back or seek an in-person evaluation if the symptoms or condition worsens.  Total duration of non-face-to-face encounter: 25 minutes.  Note by: [name], MD Date: 11/25/2018; Time: 9:07 AM  Note: This dictation was prepared with Dragon dictation. Any transcriptional errors that may result from this process are unintentional.  Disclaimer:  * Given the special circumstances of the COVID-19 pandemic, the federal government has announced that the Office for Civil Rights (OCR) will exercise its enforcement discretion and will not impose penalties on physicians using telehealth in the  event of noncompliance with regulatory requirements under the [employer] and Accountability Act (HIPAA) in connection with the good faith provision of telehealth during the IXVEZ-50 national public health emergency. (AMA)

## 2018-11-28 ENCOUNTER — Ambulatory Visit (INDEPENDENT_AMBULATORY_CARE_PROVIDER_SITE_OTHER): Payer: PPO

## 2018-11-28 ENCOUNTER — Other Ambulatory Visit: Payer: Self-pay

## 2018-11-28 DIAGNOSIS — Z0000 Encounter for general adult medical examination without abnormal findings: Secondary | ICD-10-CM

## 2018-11-28 NOTE — Patient Instructions (Addendum)
  [name] , Thank you for taking time to come for your Medicare Wellness Visit. I appreciate your ongoing commitment to your health goals. Please review the following plan we discussed and let me know if I can assist you in the future.   These are the goals we discussed: Goals      Patient Stated   . Increase physical activity (pt-stated)     Upper and lower body strengthening exercises    . Quit Smoking (pt-stated)       This is a list of the screening recommended for you and due dates:  Health Maintenance  Topic Date Due  . Complete foot exam   09/18/2018  . Flu Shot  12/20/2018  . Hemoglobin A1C  05/01/2019  . Eye exam for diabetics  06/12/2019  . Tetanus Vaccine  01/19/2028  . Pneumonia vaccines  Completed

## 2018-11-28 NOTE — Progress Notes (Signed)
Subjective:   James Hood is a 78 y.o. male who presents for Medicare Annual/Subsequent preventive examination.  Review of Systems:  No ROS.  Medicare Wellness Virtual Visit.  Visual/audio telehealth visit, UTA vital signs.   See social history for additional risk factors.   Cardiac Risk Factors include: advanced age (>56men, >22 women);male gender;diabetes mellitus;hypertension;sedentary lifestyle     Objective:    Vitals: There were no vitals taken for this visit.  There is no height or weight on file to calculate BMI.  Advanced Directives 11/28/2018 04/15/2018 04/02/2018 03/31/2018 01/14/2018 11/15/2017 08/15/2017  Does Patient Have a Medical Advance Directive? Yes Yes Yes Yes Yes Yes Yes  Type of Paramedic of [place];Living will [place];Living will Healthcare Power of [place];Living will [place];Living will [place];Living will [place];Living will  Does patient want to make changes to medical advance directive? No - Patient declined - No - Patient declined No - Patient declined - No - Patient declined No - Patient declined  Copy of [place] in Chart? Yes - validated most recent copy scanned in chart (See row information) Yes - validated most recent copy scanned in chart (See row information) No - copy requested No - copy requested - Yes No - copy requested    Tobacco Social History   Tobacco Use  Smoking Status Current Every Day Smoker  . Packs/day: 0.50  . Years: 60.00  . Pack years: 30.00  . Types: Cigarettes  . Last attempt to quit: 04/08/2018  . Years since quitting: 0.6  Smokeless Tobacco Never Used     Ready to quit: Not Answered Counseling given: Not Answered   Clinical Intake:  Pre-visit preparation completed: Yes        Diabetes: Yes(Followed by pcp)  How often do you need to have someone  help you when you read instructions, pamphlets, or other written materials from your doctor or pharmacy?: 1 - Never  Interpreter Needed?: No     Past Medical History:  Diagnosis Date  . Anxiety   . Anxiety and depression   . Arthritis   . BPH (benign prostatic hyperplasia)   . 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 Colon polyps   . Dementia ([place])   . Depression   . Diabetes mellitus type 2 in nonobese (HCC)   . GERD (gastroesophageal reflux disease)    BARRETTS ESOPHAGUS RESOLVED PER PATIENT  . Headache   . 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 History of hiatal hernia   . Hypercholesteremia   . Hypertension   . Hyperthyroidism   . Neuropathic pain   . Primary localized osteoarthritis of right knee   . Stomach ulcer   . Tremor, essential    Past Surgical History:  Procedure Laterality Date  . BACK SURGERY    . COLONOSCOPY WITH PROPOFOL N/A 09/14/2016   Procedure: COLONOSCOPY WITH PROPOFOL;  Surgeon: Manya Silvas, MD;  Location: [facility] ENDOSCOPY;  Service: Endoscopy;  Laterality: N/A;  . esophageal stretch    . JOINT REPLACEMENT Right 2016   knee  . LUMBAR LAMINECTOMY/DECOMPRESSION MICRODISCECTOMY Left 02/03/2016   Procedure: LEFT L5-S1 DISKECTOMY;  Surgeon: Leeroy Cha, MD;  Location: [facility]URO ORS;  Service: Neurosurgery;  Laterality: Left;  LEFT L5-S1 DISKECTOMY  . PULSE GENERATOR IMPLANT N/A 08/21/2017   Procedure: UNILATERAL PULSE GENERATOR IMPLANT;  Surgeon: Meade Maw, MD;  Location: [facility]  ORS;  Service: Neurosurgery;  Laterality: N/A;  . PULSE GENERATOR IMPLANT Right 04/02/2018   Procedure: REMOVAL OF PULSE GENERATOR IMPLANT AND LEADS;  Surgeon: Meade Maw, MD;  Location: [facility] ORS;  Service: Neurosurgery;  Laterality: Right;  . TONSILLECTOMY    . TOTAL KNEE ARTHROPLASTY Right 11/15/2014   Procedure: TOTAL KNEE ARTHROPLASTY;  Surgeon: Elsie Saas, MD;  Location: [place];  Service: Orthopedics;  Laterality: Right;   Family  History  Problem Relation Age of Onset  . Heart attack Mother   . Heart disease Mother   . Hypertension Father   . Depression Father   . Kidney cancer Neg Hx   . Prostate cancer Neg Hx    Social History   Socioeconomic History  . Marital status: Married    Spouse name: Not on file  . Number of children: Not on file  . Years of education: Not on file  . Highest education level: Not on file  Occupational History  . Not on file  Social Needs  . Financial resource strain: Not hard at all  . Food insecurity    Worry: Never true    Inability: Never true  . Transportation needs    Medical: No    Non-medical: No  Tobacco Use  . Smoking status: Current Every Day Smoker    Packs/day: 0.50    Years: 60.00    Pack years: 30.00    Types: Cigarettes    Last attempt to quit: 04/08/2018    Years since quitting: 0.6  . Smokeless tobacco: Never Used  Substance and Sexual Activity  . 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 Drug use: Never  . Sexual activity: Not Currently  Lifestyle  . Physical activity    Days per week: 0 days    Minutes per session: Not on file  . Stress: Not at all  Relationships  . Social Herbalist on phone: Patient refused    Gets together: Patient refused    Attends religious service: Patient refused    Active member of club or organization: Patient refused    Attends meetings of clubs or organizations: Patient refused    Relationship status: Patient refused  Other Topics Concern  . Not on file  Social History Narrative  . Not on file    Outpatient Encounter Medications as of 11/28/2018  Medication Sig  . aspirin EC 81 MG tablet Take 81 mg by mouth daily.  Marland Kitchen buPROPion (WELLBUTRIN SR) 150 MG 12 hr tablet TAKE ONE TABLET BY MOUTH TWICE A DAY  . cyclobenzaprine (FLEXERIL) 10 MG tablet Take 1 tablet (10 mg total) by mouth 3 (three) times daily as  needed for muscle spasms.  . diphenoxylate-atropine (LOMOTIL) 2.5-0.025 MG tablet Take 1 tablet by mouth 2 (two) times daily.   . divalproex (DEPAKOTE ER) 500 MG 24 hr tablet Take 1 tablet by mouth 2 (two) times daily.  Marland Kitchen donepezil (ARICEPT) 10 MG tablet Take 10 mg by mouth at bedtime.  . fenofibrate (TRICOR) 145 MG tablet TAKE ONE TABLET BY MOUTH DAILY  . finasteride (PROSCAR) 5 MG tablet TAKE ONE TABLET BY MOUTH DAILY  . gabapentin (NEURONTIN) 600 MG tablet TAKE TWO TABLETS BY MOUTH THREE TIMES A DAY  . lisinopril-hydrochlorothiazide (ZESTORETIC) 20-25 MG tablet TAKE ONE TABLET BY MOUTH TWICE A DAY  . Melatonin 10 MG TABS Take 20 mg by  mouth at bedtime.  . memantine (NAMENDA) 10 MG tablet Take 10 mg by mouth 2 (two) times daily.   . metFORMIN (GLUCOPHAGE) 500 MG tablet TAKE TWO TABLETS BY MOUTH TWICE A DAY WITH MEALS  . metoprolol succinate (TOPROL-XL) 25 MG 24 hr tablet Take 25 mg by mouth at bedtime.   . Multiple Vitamins-Minerals (CENTRUM SILVER PO) Take 1 tablet by mouth daily.  Marland Kitchen omeprazole (PRILOSEC) 20 MG capsule Take 20 mg by mouth 2 (two) times daily before a meal.   . [START ON 12/19/2018] oxyCODONE-acetaminophen (PERCOCET) 10-325 MG tablet Take 1 tablet by mouth every 6 (six) hours as needed for pain. Must last 30 days.  Derrill Memo ON 01/18/2019] oxyCODONE-acetaminophen (PERCOCET) 10-325 MG tablet Take 1 tablet by mouth every 6 (six) hours as needed for pain. Must last 30 days.  Derrill Memo ON 02/17/2019] oxyCODONE-acetaminophen (PERCOCET) 10-325 MG tablet Take 1 tablet by mouth every 6 (six) hours as needed for pain. Must last 30 days.  . primidone (MYSOLINE) 50 MG tablet Take 150-250 mg by mouth 2 (two) times daily. Take 250 mg by mouth in the morning & take 150 mg at night.  . QUEtiapine (SEROQUEL) 25 MG tablet Take 250 mg by mouth at bedtime.   . rosuvastatin (CRESTOR) 40 MG tablet Take 1 tablet (40 mg total) by mouth daily.  . sitaGLIPtin (JANUVIA) 100 MG tablet Take 1 tablet (100 mg  total) by mouth daily.  . tamsulosin (FLOMAX) 0.4 MG CAPS capsule TAKE ONE CAPSULE BY MOUTH DAILY AFTER SUPPER   No facility-administered encounter medications on file as of 11/28/2018.     Activities of Daily Living In your present state of health, do you have any difficulty performing the following activities: 11/28/2018 04/02/2018  Hearing? N -  Vision? N -  Difficulty concentrating or making decisions? N -  Walking or climbing stairs? N -  Dressing or bathing? N -  Doing errands, shopping? N N  Preparing Food and eating ? N -  Using the Toilet? N -  In the past six months, have you accidently leaked urine? N -  Do you have problems with loss of bowel control? N -  Managing your Medications? Y -  Managing your Finances? Y -  Housekeeping or managing your Housekeeping? N -  Some recent data might be hidden    Patient Care Team: Leone Haven, MD as PCP - General (Family Medicine)   Assessment:   This is a routine wellness examination for Georg.  I connected with patient 11/28/18 at 10:00 AM EDT by an audio enabled telemedicine application and verified that I am speaking with the correct person using two identifiers. Patient stated full name and DOB. Patient gave permission to continue with virtual visit. Patient's location was at home and Nurse's location was at [place] office.   Health Screenings  Colonoscopy - 08/2016 Glaucoma -none Hearing -demonstrates normal hearing during visit. Hemoglobin A1C - 10/2018 Cholesterol - 10/2018 TSH- 10/2018 Dental- UTD Vision- visits within the last 12 months.  Social  Alcohol intake - no  Smoking history- current Smokers in home? none Illicit drug use? none Exercise - none Diet - Regular Sexually Active -not currently BMI- discussed the importance of a healthy diet, water intake and the benefits of aerobic exercise.  Educational material provided.   Safety  Patient feels safe at home- yes Patient does have smoke detectors at  home- yes Patient does wear sunscreen or protective clothing when in direct sunlight -yes Patient does  wear seat belt when in a moving vehicle -yes Patient drives- yes  [id number] precautions and sickness symptoms discussed.   Activities of Daily Living Patient denies needing assistance with: driving, household chores, feeding themselves, getting from bed to chair, getting to the toilet, bathing/showering, dressing, or preparing meals.  Ambulates with cane as needed.  Wife manages the money and medications.   Depression Screen Patient denies losing interest in daily life, feeling hopeless, or crying easily over simple problems.   Medication-taking as directed and without issues.   Fall Screen Patient denies being afraid of falling or falling in the last year.   Memory Screen Patient is alert.  Correctly identified the president of the [place], season and recall 2/3. Patient likes to read and watch educational programs on television for brain stimulation. Visits with neurology every 6 months.  Immunizations The following Immunizations were discussed: Influenza, shingles, pneumonia, and tetanus.   Other Providers Patient Care Team: Leone Haven, MD as PCP - General (Family Medicine)  Exercise Activities and Dietary recommendations Current Exercise Habits: The patient does not participate in regular exercise at present  Goals      Patient Stated   . Increase physical activity (pt-stated)     Upper and lower body strengthening exercises    . Quit Smoking (pt-stated)       Fall Risk Fall Risk  11/28/2018 11/24/2018 08/26/2018 06/17/2018 04/15/2018  Falls in the past year? 0 0 0 1 1  Number falls in past yr: - 0 - 1 0  Comment - - - - -  Injury with Fall? - 0 - 0 0  Comment - - - - -  Risk Factor Category  - - - - -  Risk for fall due to : - - - - -  Follow up - - - - -  Comment - - - - -   Depression Screen PHQ 2/9 Scores 11/28/2018 08/26/2018 04/15/2018 03/19/2018  PHQ  - 2 Score 0 0 0 0  PHQ- 9 Score - - - -  Exception Documentation - - - -    Cognitive Function MMSE - Mini Mental State Exam 11/15/2017 11/14/2016  Orientation to time 5 5  Orientation to Place 5 5  Registration 3 3  Attention/ Calculation 5 5  Recall 3 3  Language- name 2 objects 2 2  Language- repeat 1 1  Language- follow 3 step command 3 3  Language- read & follow direction 1 1  Write a sentence 1 1  Copy design 1 1  Total score 30 30     6CIT Screen 11/28/2018  What Year? 0 points  What month? 0 points  What time? 0 points  Count back from 20 0 points  Months in reverse 0 points  Repeat phrase 0 points  Total Score 0    Immunization History  Administered Date(s) Administered  . Influenza, High Dose Seasonal PF 01/29/2017, 03/19/2018  . Influenza,inj,Quad PF,6+ Mos 03/13/2016  . Influenza-Unspecified 01/21/2015  . Pneumococcal Conjugate-13 02/07/2015  . Pneumococcal Polysaccharide-23 01/18/2018  . Tdap 09/16/2015, 01/18/2018  . Zoster 09/16/2015  . Zoster Recombinat (Shingrix) 01/03/2018   Screening Tests Health Maintenance  Topic Date Due  . FOOT EXAM  09/18/2018  . INFLUENZA VACCINE  12/20/2018  . HEMOGLOBIN A1C  05/01/2019  . OPHTHALMOLOGY EXAM  06/12/2019  . TETANUS/TDAP  01/19/2028  . PNA vac Low Risk Adult  Completed      Plan:    End of life planning; Advance aging;  Advanced directives discussed.  Copy of current HCPOA/Living Will on file.    I have personally reviewed and noted the following in the patient's chart:   . Medical and social history . Use of alcohol, tobacco or illicit drugs  . Current medications and supplements . Functional ability and status . Nutritional status . Physical activity . Advanced directives . List of other physicians . Hospitalizations, surgeries, and ER visits in previous 12 months . Vitals . Screenings to include cognitive, depression, and falls . Referrals and appointments  In addition, I have reviewed and  discussed with patient certain preventive protocols, quality metrics, and best practice recommendations. A written personalized care plan for preventive services as well as general preventive health recommendations were provided to patient.     Varney Biles, LPN  [id number]

## 2018-11-28 NOTE — Progress Notes (Signed)
I have reviewed the above note and agree.  Caleel Kiner, M.D.  

## 2018-12-12 ENCOUNTER — Other Ambulatory Visit: Payer: Self-pay | Admitting: Family Medicine

## 2018-12-13 ENCOUNTER — Other Ambulatory Visit: Payer: Self-pay | Admitting: Family Medicine

## 2018-12-16 ENCOUNTER — Other Ambulatory Visit: Payer: Self-pay | Admitting: Family Medicine

## 2018-12-23 ENCOUNTER — Other Ambulatory Visit: Payer: Self-pay | Admitting: Family Medicine

## 2019-01-03 IMAGING — MR MR ANKLE*L* W/O CM
5 series · 40 of 40 positions shown · non-contrast
Comparison: Left foot radiographs 07/17/2011.

CLINICAL DATA: Chronic lateral ankle pain. No acute injury or prior
relevant surgery.

EXAM:
MRI OF THE LEFT ANKLE WITHOUT CONTRAST
TECHNIQUE: Multiplanar, multisequence MR imaging of the ankle was performed. No
intravenous contrast was administered.

[Series 3: PD fat-sat · axial · 3.0mm · 0.62mm/px · z∈[-103,+25]mm · 9 of 40 slices shown]
[im 1/40]
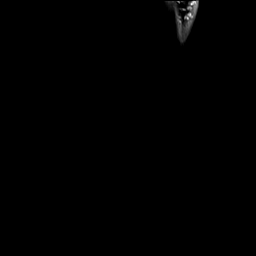
[im 5/40]
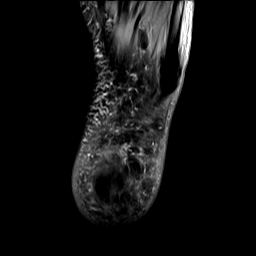
[im 10/40]
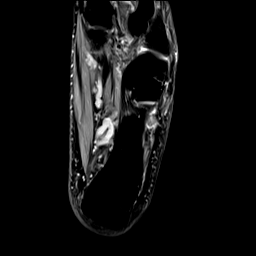
[im 15/40]
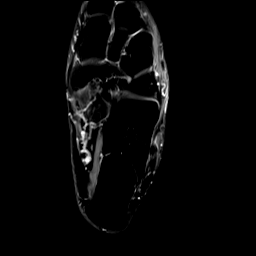
[im 20/40]
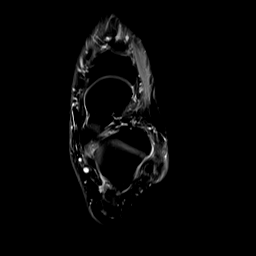
[im 25/40]
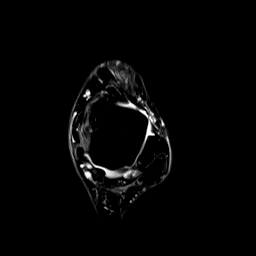
[im 30/40]
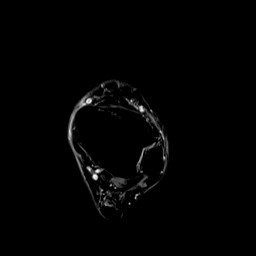
[im 35/40]
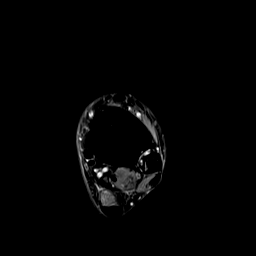
[im 40/40]
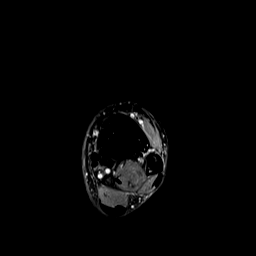

[Series 4: T2 fat-sat · axial · 3.0mm · 0.62mm/px · z∈[-103,+25]mm · 9 of 40 slices shown (1 of 3)]
[im 1/40]
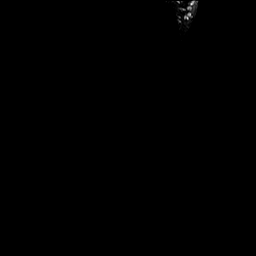
[im 5/40]
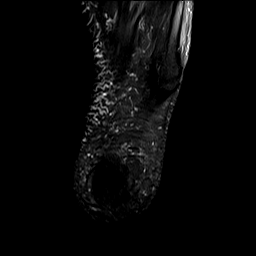
[im 10/40]
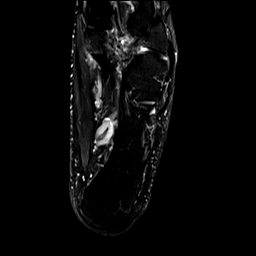
[im 15/40]
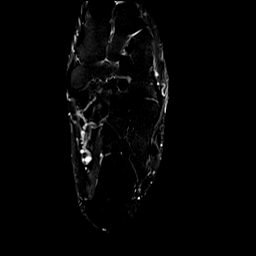
[im 20/40]
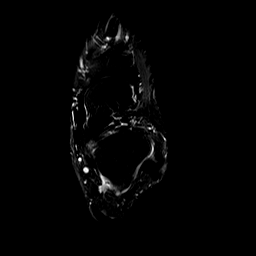
[im 25/40]
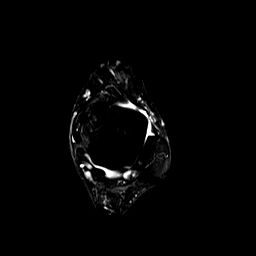
[im 30/40]
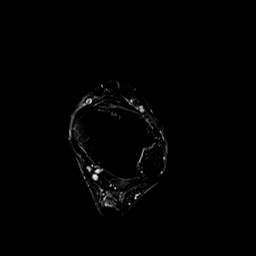
[im 35/40]
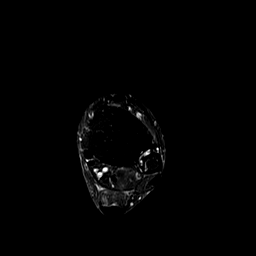
[im 40/40]
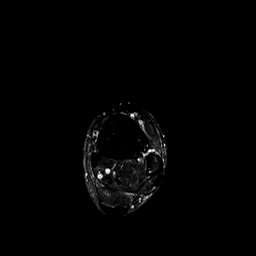

[Series 5: T2 fat-sat · coronal · 3.0mm · 0.70mm/px · 10 of 45 slices shown (2 of 3)]
[im 1/45]
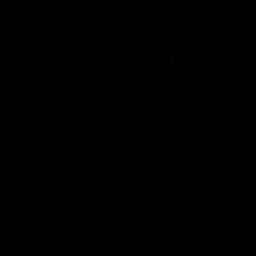
[im 5/45]
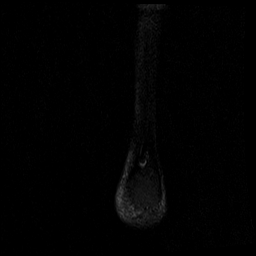
[im 10/45]
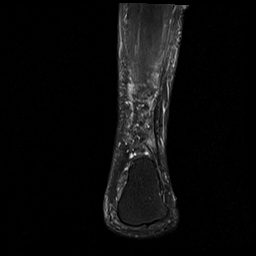
[im 15/45]
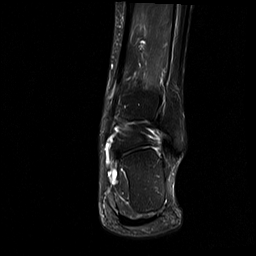
[im 20/45]
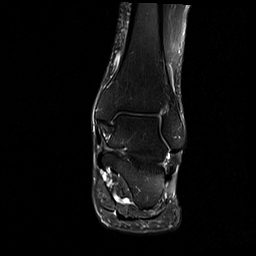
[im 25/45]
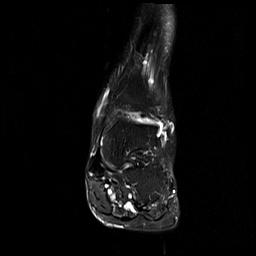
[im 30/45]
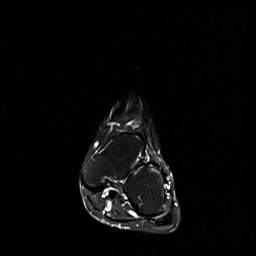
[im 35/45]
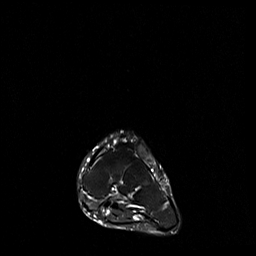
[im 40/45]
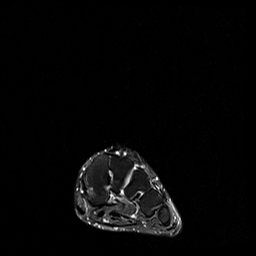
[im 45/45]
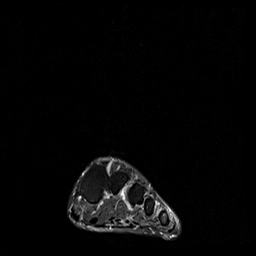

[Series 6: T1 · sagittal · 3.0mm · 0.56mm/px · 6 of 24 slices shown]
[im 1/24]
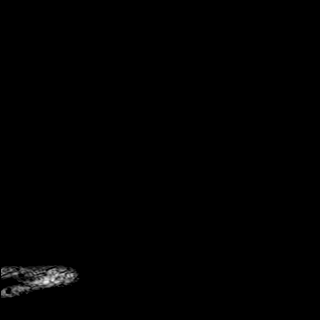
[im 5/24]
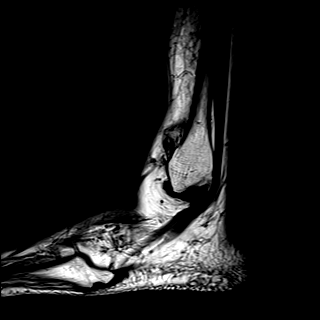
[im 10/24]
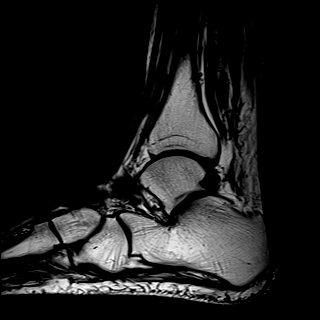
[im 14/24]
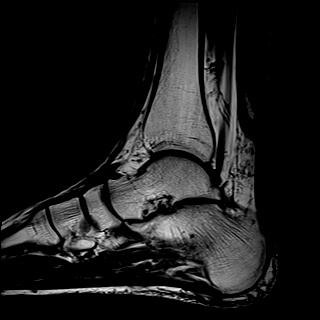
[im 19/24]
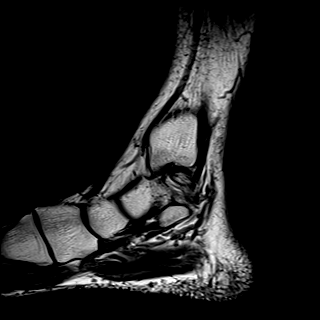
[im 24/24]
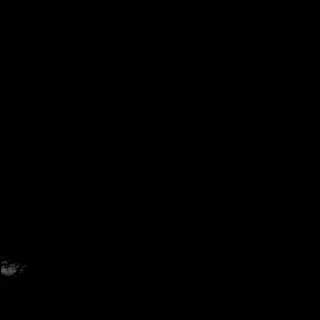

[Series 7: T2 fat-sat · sagittal · 3.0mm · 0.70mm/px · 6 of 24 slices shown (3 of 3)]
[im 1/24]
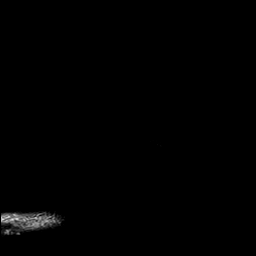
[im 5/24]
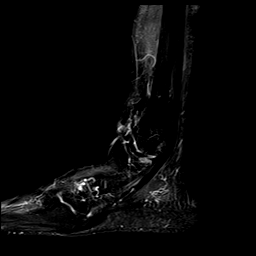
[im 10/24]
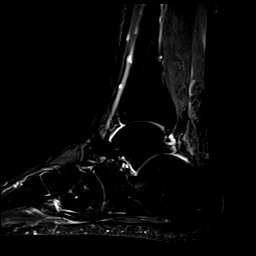
[im 14/24]
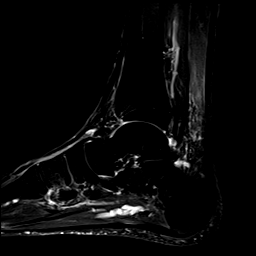
[im 19/24]
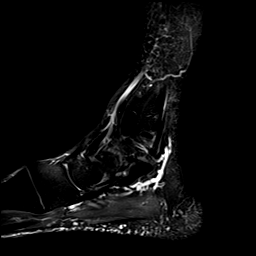
[im 24/24]
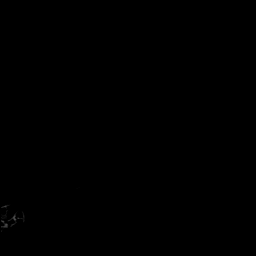

[40 of 40 positions shown; findings below may reference images not displayed]

FINDINGS: TENDONS

Peroneal: The peroneus brevis tendon is flattened with a
longitudinal split tear at the level of the lateral malleolus. The
peroneus longus tendon appears normal. There is no peroneal tendon
subluxation, retraction or significant sheath fluid.

Posteromedial: Intact and normally positioned.

Anterior: Intact and normally positioned.

Achilles: Intact.

Plantar Fascia: Slight nodular thickening of the central cord 3.8 cm
distal to the calcaneal attachment, possibly mild plantar
fibromatosis. No evidence of plantar fasciitis. Small plantar
calcaneal spur.

LIGAMENTS

Lateral: The anterior and posterior talofibular and calcaneofibular
ligaments are intact.

Medial: The deltoid and visualized portions of the spring ligament
appear intact.

CARTILAGE AND BONES

Ankle Joint: No significant ankle joint effusion. The talar dome and
tibial plafond are intact.

Subtalar Joints/Sinus Tarsi: Unremarkable.

Bones: No significant extra-articular osseous findings. Previously
demonstrated fracture of the 5th metatarsal base has healed.

Other: No significant soft tissue findings.
IMPRESSION: 1. Chronic appearing longitudinal split tear of the peroneus brevis
tendon. No full-thickness tendon tear or tenosynovitis.
2. The additional ankle tendons and ligaments appear normal.
3. No acute osseous findings or significant arthropathic changes.
4. Possible minimal plantar fibromatosis.

## 2019-01-11 ENCOUNTER — Other Ambulatory Visit: Payer: Self-pay | Admitting: Family Medicine

## 2019-01-14 ENCOUNTER — Other Ambulatory Visit (INDEPENDENT_AMBULATORY_CARE_PROVIDER_SITE_OTHER): Payer: PPO

## 2019-01-14 ENCOUNTER — Other Ambulatory Visit: Payer: Self-pay

## 2019-01-14 DIAGNOSIS — E782 Mixed hyperlipidemia: Secondary | ICD-10-CM | POA: Diagnosis not present

## 2019-01-14 LAB — HEPATIC FUNCTION PANEL
ALT: 19 U/L (ref 0–53)
AST: 22 U/L (ref 0–37)
Albumin: 4 g/dL (ref 3.5–5.2)
Alkaline Phosphatase: 32 U/L — ABNORMAL LOW (ref 39–117)
Bilirubin, Direct: 0.1 mg/dL (ref 0.0–0.3)
Total Bilirubin: 0.2 mg/dL (ref 0.2–1.2)
Total Protein: 6 g/dL (ref 6.0–8.3)

## 2019-01-14 LAB — LIPID PANEL
Cholesterol: 114 mg/dL (ref 0–200)
HDL: 33.5 mg/dL — ABNORMAL LOW (ref >39.00)
NonHDL: 80.81
Total CHOL/HDL Ratio: 3
Triglycerides: 220 mg/dL — ABNORMAL HIGH (ref 0.0–149.0)
VLDL: 44 mg/dL — ABNORMAL HIGH (ref 0.0–40.0)

## 2019-01-14 LAB — LDL CHOLESTEROL, DIRECT: Direct LDL: 44 mg/dL

## 2019-01-16 DIAGNOSIS — Z1159 Encounter for screening for other viral diseases: Secondary | ICD-10-CM | POA: Diagnosis not present

## 2019-01-17 ENCOUNTER — Other Ambulatory Visit: Payer: Self-pay | Admitting: Family Medicine

## 2019-01-19 DIAGNOSIS — K22711 Barrett's esophagus with high grade dysplasia: Secondary | ICD-10-CM | POA: Diagnosis not present

## 2019-01-19 DIAGNOSIS — E119 Type 2 diabetes mellitus without complications: Secondary | ICD-10-CM | POA: Diagnosis not present

## 2019-01-19 DIAGNOSIS — Z09 Encounter for follow-up examination after completed treatment for conditions other than malignant neoplasm: Secondary | ICD-10-CM | POA: Diagnosis not present

## 2019-01-19 DIAGNOSIS — K228 Other specified diseases of esophagus: Secondary | ICD-10-CM | POA: Diagnosis not present

## 2019-01-19 DIAGNOSIS — E785 Hyperlipidemia, unspecified: Secondary | ICD-10-CM | POA: Diagnosis not present

## 2019-01-19 DIAGNOSIS — Q402 Other specified congenital malformations of stomach: Secondary | ICD-10-CM | POA: Diagnosis not present

## 2019-01-19 DIAGNOSIS — I10 Essential (primary) hypertension: Secondary | ICD-10-CM | POA: Diagnosis not present

## 2019-01-19 DIAGNOSIS — F17210 Nicotine dependence, cigarettes, uncomplicated: Secondary | ICD-10-CM | POA: Diagnosis not present

## 2019-01-19 DIAGNOSIS — Z7984 Long term (current) use of oral hypoglycemic drugs: Secondary | ICD-10-CM | POA: Diagnosis not present

## 2019-01-19 DIAGNOSIS — K449 Diaphragmatic hernia without obstruction or gangrene: Secondary | ICD-10-CM | POA: Diagnosis not present

## 2019-01-19 DIAGNOSIS — K6389 Other specified diseases of intestine: Secondary | ICD-10-CM | POA: Diagnosis not present

## 2019-01-19 DIAGNOSIS — Z79899 Other long term (current) drug therapy: Secondary | ICD-10-CM | POA: Diagnosis not present

## 2019-01-19 DIAGNOSIS — K2930 Chronic superficial gastritis without bleeding: Secondary | ICD-10-CM | POA: Diagnosis not present

## 2019-01-19 DIAGNOSIS — K219 Gastro-esophageal reflux disease without esophagitis: Secondary | ICD-10-CM | POA: Diagnosis not present

## 2019-01-21 ENCOUNTER — Other Ambulatory Visit: Payer: Self-pay

## 2019-01-23 ENCOUNTER — Ambulatory Visit: Payer: PPO | Admitting: Family Medicine

## 2019-01-27 DIAGNOSIS — F17200 Nicotine dependence, unspecified, uncomplicated: Secondary | ICD-10-CM | POA: Diagnosis not present

## 2019-01-27 DIAGNOSIS — R251 Tremor, unspecified: Secondary | ICD-10-CM | POA: Diagnosis not present

## 2019-01-27 DIAGNOSIS — R262 Difficulty in walking, not elsewhere classified: Secondary | ICD-10-CM | POA: Diagnosis not present

## 2019-01-27 DIAGNOSIS — R4189 Other symptoms and signs involving cognitive functions and awareness: Secondary | ICD-10-CM | POA: Diagnosis not present

## 2019-01-27 DIAGNOSIS — G479 Sleep disorder, unspecified: Secondary | ICD-10-CM | POA: Diagnosis not present

## 2019-01-29 ENCOUNTER — Other Ambulatory Visit: Payer: Self-pay

## 2019-01-31 ENCOUNTER — Other Ambulatory Visit: Payer: Self-pay | Admitting: Family Medicine

## 2019-02-02 ENCOUNTER — Other Ambulatory Visit: Payer: Self-pay | Admitting: Family Medicine

## 2019-02-02 ENCOUNTER — Encounter: Payer: Self-pay | Admitting: Family Medicine

## 2019-02-02 ENCOUNTER — Other Ambulatory Visit: Payer: Self-pay

## 2019-02-02 ENCOUNTER — Ambulatory Visit (INDEPENDENT_AMBULATORY_CARE_PROVIDER_SITE_OTHER): Payer: PPO | Admitting: Family Medicine

## 2019-02-02 VITALS — BP 120/70 | HR 84 | Temp 97.9°F | Ht 71.0 in | Wt 183.4 lb

## 2019-02-02 DIAGNOSIS — G8929 Other chronic pain: Secondary | ICD-10-CM | POA: Diagnosis not present

## 2019-02-02 DIAGNOSIS — E041 Nontoxic single thyroid nodule: Secondary | ICD-10-CM | POA: Diagnosis not present

## 2019-02-02 DIAGNOSIS — E1142 Type 2 diabetes mellitus with diabetic polyneuropathy: Secondary | ICD-10-CM | POA: Diagnosis not present

## 2019-02-02 DIAGNOSIS — E785 Hyperlipidemia, unspecified: Secondary | ICD-10-CM

## 2019-02-02 DIAGNOSIS — M25512 Pain in left shoulder: Secondary | ICD-10-CM

## 2019-02-02 DIAGNOSIS — F419 Anxiety disorder, unspecified: Secondary | ICD-10-CM | POA: Diagnosis not present

## 2019-02-02 DIAGNOSIS — E559 Vitamin D deficiency, unspecified: Secondary | ICD-10-CM

## 2019-02-02 DIAGNOSIS — F32A Depression, unspecified: Secondary | ICD-10-CM

## 2019-02-02 DIAGNOSIS — R5383 Other fatigue: Secondary | ICD-10-CM | POA: Diagnosis not present

## 2019-02-02 DIAGNOSIS — K529 Noninfective gastroenteritis and colitis, unspecified: Secondary | ICD-10-CM | POA: Diagnosis not present

## 2019-02-02 DIAGNOSIS — F329 Major depressive disorder, single episode, unspecified: Secondary | ICD-10-CM | POA: Diagnosis not present

## 2019-02-02 LAB — COMPREHENSIVE METABOLIC PANEL
ALT: 16 U/L (ref 0–53)
AST: 21 U/L (ref 0–37)
Albumin: 3.9 g/dL (ref 3.5–5.2)
Alkaline Phosphatase: 46 U/L (ref 39–117)
BUN: 29 mg/dL — ABNORMAL HIGH (ref 6–23)
CO2: 28 mEq/L (ref 19–32)
Calcium: 9.2 mg/dL (ref 8.4–10.5)
Chloride: 100 mEq/L (ref 96–112)
Creatinine, Ser: 1.23 mg/dL (ref 0.40–1.50)
GFR: 56.76 mL/min — ABNORMAL LOW (ref >60.00)
Glucose, Bld: 287 mg/dL — ABNORMAL HIGH (ref 70–99)
Potassium: 4.1 mEq/L (ref 3.5–5.1)
Sodium: 137 mEq/L (ref 135–145)
Total Bilirubin: 0.2 mg/dL (ref 0.2–1.2)
Total Protein: 6.3 g/dL (ref 6.0–8.3)

## 2019-02-02 LAB — CBC WITH DIFFERENTIAL/PLATELET
Basophils Absolute: 0 10*3/uL (ref 0.0–0.1)
Basophils Relative: 0.5 % (ref 0.0–3.0)
Eosinophils Absolute: 0.1 10*3/uL (ref 0.0–0.7)
Eosinophils Relative: 1 % (ref 0.0–5.0)
HCT: 33.5 % — ABNORMAL LOW (ref 39.0–52.0)
Hemoglobin: 11.3 g/dL — ABNORMAL LOW (ref 13.0–17.0)
Lymphocytes Relative: 30 % (ref 12.0–46.0)
Lymphs Abs: 2 10*3/uL (ref 0.7–4.0)
MCHC: 33.8 g/dL (ref 30.0–36.0)
MCV: 93.5 fl (ref 78.0–100.0)
Monocytes Absolute: 0.5 10*3/uL (ref 0.1–1.0)
Monocytes Relative: 7 % (ref 3.0–12.0)
Neutro Abs: 4.1 10*3/uL (ref 1.4–7.7)
Neutrophils Relative %: 61.5 % (ref 43.0–77.0)
Platelets: 258 10*3/uL (ref 150.0–400.0)
RBC: 3.59 Mil/uL — ABNORMAL LOW (ref 4.22–5.81)
RDW: 12.8 % (ref 11.5–15.5)
WBC: 6.6 10*3/uL (ref 4.0–10.5)

## 2019-02-02 LAB — VITAMIN B12: Vitamin B-12: 341 pg/mL (ref 211–911)

## 2019-02-02 LAB — VITAMIN D 25 HYDROXY (VIT D DEFICIENCY, FRACTURES): VITD: 13.94 ng/mL — ABNORMAL LOW (ref 30.00–100.00)

## 2019-02-02 LAB — TSH: TSH: 1.01 u[IU]/mL (ref 0.35–4.50)

## 2019-02-02 MED ORDER — VITAMIN D (ERGOCALCIFEROL) 1.25 MG (50000 UNIT) PO CAPS: 50000.0000 [IU] | ORAL_CAPSULE | ORAL | 0 refills | Status: DC

## 2019-02-02 NOTE — Assessment & Plan Note (Signed)
Chronic issue.  Metformin could be contributing.  Will discontinue this and see if it improves.  He will continue Lomotil.  Consider referral back to GI if this continues to be an issue.

## 2019-02-02 NOTE — Assessment & Plan Note (Signed)
Relatively stable.  He will continue his current regimen.  He will decrease his Seroquel to 4 tablets nightly and see if that helps with his lack of energy.  He will let his neurologist know that he is doing that.

## 2019-02-02 NOTE — Assessment & Plan Note (Signed)
Ultrasound ordered to follow-up on this.

## 2019-02-02 NOTE — Assessment & Plan Note (Addendum)
Continue current regimen.  Discussed dietary changes to help reduce his triglycerides.  Plan to recheck in 3 months at a visit.

## 2019-02-02 NOTE — Assessment & Plan Note (Signed)
We are having him hold his metformin to see if that helps with his diarrhea.  We will follow-up in 2 weeks and if needed we can start him on a alternative medication.

## 2019-02-02 NOTE — Patient Instructions (Signed)
Nice to see you. Please discontinue your metformin.  We will do follow-up in 2 weeks to see how this is going. We will check lab work today and contact you with results. Please try to add vegetables to dinner 2-3 times weekly.  These can be steamed frozen vegetables.  Please decrease your portion sizes of carbs and fatty food as we discussed.

## 2019-02-02 NOTE — Assessment & Plan Note (Signed)
He has an appointment with orthopedics later this week.  He will keep that.

## 2019-02-02 NOTE — Progress Notes (Signed)
Tommi Rumps, MD Phone: [phone]  James Hood is a 78 y.o. male who presents today for follow-up.  Chronic diarrhea: Patient continues to have issues with this.  He tried stopping [facility] though that has not made a difference.  He does take Lomotil and it does help sometimes.  He took Imodium today.  No blood in his stool.  He does have chronic abdominal discomfort associated with this.  Typically improves after having diarrhea though sometimes does not.  He continues on metformin.  Diabetes: Most recent A1c is 7.2.  He is on metformin and Januvia.  No polyuria or polydipsia.  No hypoglycemia.  He avoids sweets.  Thyroid nodule: He had a biopsy last year that was benign.  Due for ultrasound.  Hyperlipidemia: Continues on fenofibrate and Crestor.  Triglycerides mildly elevated.  He does eat a fairly high fat diet with biscuits with ham and eggs or sausage in the morning.  Occasionally skips lunch.  He eats [employer] frozen family style meals at dinner that have lots of cheese in them.  Not very many vegetables.  He has a very sedentary lifestyle.  No motivation to exercise at this time.  Depression: He does occasionally have issues with depression.  He continues on his Wellbutrin.  He does take Seroquel for sleep that is prescribed by neurology.  He denies SI.  He notes the Seroquel does make him sleepy in the morning though it resolves by the midday.  He is taking 5 pills and thinks he is going to decrease to 4 pills.  He notes the prescription states he can take 4 to 5 pills.  Left shoulder pain: This is a chronic issue.  He is seeing orthopedics later this week for treatment and evaluation of this.  Social History   Tobacco Use  Smoking Status Current Every Day Smoker  . Packs/day: 0.50  . Years: 60.00  . Pack years: 30.00  . Types: Cigarettes  . Last attempt to quit: 04/08/2018  . Years since quitting: 0.8  Smokeless Tobacco Never Used     ROS see history of  present illness  Objective  Physical Exam Vitals:   02/02/19 1130  BP: 120/70  Pulse: 84  Temp: 97.9 F (36.6 C)  SpO2: 95%    BP Readings from Last 3 Encounters:  02/02/19 120/70  08/26/18 114/76  06/17/18 128/83   Wt Readings from Last 3 Encounters:  02/02/19 183 lb 6.4 oz (83.2 kg)  08/26/18 180 lb (81.6 kg)  06/17/18 180 lb (81.6 kg)    Physical Exam Constitutional:      General: He is not in acute distress.    Appearance: He is not diaphoretic.  Cardiovascular:     Rate and Rhythm: Normal rate and regular rhythm.     Heart sounds: Normal heart sounds.  Pulmonary:     Effort: Pulmonary effort is normal.     Breath sounds: Normal breath sounds.  Abdominal:     General: Bowel sounds are normal. There is no distension.     Palpations: Abdomen is soft.     Tenderness: There is no abdominal tenderness. There is no guarding or rebound.  Musculoskeletal:     Right lower leg: No edema.     Left lower leg: No edema.  Skin:    General: Skin is warm and dry.  Neurological:     Mental Status: He is alert.      Assessment/Plan: Please see individual problem list.  Thyroid nodule Ultrasound ordered  to follow-up on this.  Type 2 diabetes mellitus (HCC) We are having him hold his metformin to see if that helps with his diarrhea.  We will follow-up in 2 weeks and if needed we can start him on a alternative medication.  Chronic diarrhea Chronic issue.  Metformin could be contributing.  Will discontinue this and see if it improves.  He will continue Lomotil.  Consider referral back to GI if this continues to be an issue.  HLD (hyperlipidemia) Continue current regimen.  Discussed dietary changes to help reduce his triglycerides.  Plan to recheck in 3 months at a visit.  Anxiety and depression Relatively stable.  He will continue his current regimen.  He will decrease his Seroquel to 4 tablets nightly and see if that helps with his lack of energy.  He will let his  neurologist know that he is doing that.  Left shoulder pain He has an appointment with orthopedics later this week.  He will keep that.   Orders Placed This Encounter  Procedures  . US THYROID    Standing Status:   Future    Standing Expiration Date:   04/03/2020    Order Specific Question:   Reason for Exam (SYMPTOM  OR DIAGNOSIS REQUIRED)    Answer:   thyroid nodules, s/p biopsy with benign results 2019    Order Specific Question:   Preferred imaging location?    Answer:   [facility]  . B12  . Vitamin D (25 hydroxy)  . TSH  . Comp Met (CMET)  . CBC w/Diff    No orders of the defined types were placed in this encounter.    Tommi Rumps, MD [place]

## 2019-02-04 ENCOUNTER — Other Ambulatory Visit: Payer: Self-pay | Admitting: Family Medicine

## 2019-02-04 DIAGNOSIS — M19011 Primary osteoarthritis, right shoulder: Secondary | ICD-10-CM | POA: Diagnosis not present

## 2019-02-04 DIAGNOSIS — E1142 Type 2 diabetes mellitus with diabetic polyneuropathy: Secondary | ICD-10-CM

## 2019-02-11 ENCOUNTER — Ambulatory Visit: Payer: PPO

## 2019-02-13 ENCOUNTER — Other Ambulatory Visit: Payer: Self-pay | Admitting: Family Medicine

## 2019-02-17 ENCOUNTER — Other Ambulatory Visit: Payer: Self-pay

## 2019-02-17 ENCOUNTER — Other Ambulatory Visit: Payer: Self-pay | Admitting: Family Medicine

## 2019-02-18 ENCOUNTER — Other Ambulatory Visit: Payer: Self-pay

## 2019-02-18 ENCOUNTER — Ambulatory Visit (INDEPENDENT_AMBULATORY_CARE_PROVIDER_SITE_OTHER): Payer: PPO | Admitting: Family Medicine

## 2019-02-18 ENCOUNTER — Encounter: Payer: Self-pay | Admitting: Family Medicine

## 2019-02-18 VITALS — BP 140/70 | HR 97 | Temp 98.0°F | Ht 71.0 in | Wt 183.4 lb

## 2019-02-18 DIAGNOSIS — E1142 Type 2 diabetes mellitus with diabetic polyneuropathy: Secondary | ICD-10-CM

## 2019-02-18 DIAGNOSIS — E559 Vitamin D deficiency, unspecified: Secondary | ICD-10-CM | POA: Insufficient documentation

## 2019-02-18 DIAGNOSIS — K529 Noninfective gastroenteritis and colitis, unspecified: Secondary | ICD-10-CM | POA: Diagnosis not present

## 2019-02-18 DIAGNOSIS — Z720 Tobacco use: Secondary | ICD-10-CM | POA: Diagnosis not present

## 2019-02-18 MED ORDER — EMPAGLIFLOZIN 10 MG PO TABS: 10.0000 mg | ORAL_TABLET | Freq: Every day | ORAL | 3 refills | Status: DC

## 2019-02-18 NOTE — Assessment & Plan Note (Signed)
Continue vitamin D.  Recheck in 6 weeks as planned.

## 2019-02-18 NOTE — Assessment & Plan Note (Signed)
Resolved.  This was likely related to the metformin.  To monitor for recurrence.

## 2019-02-18 NOTE — Patient Instructions (Signed)
Nice to see you. We will have you see the pharmacist for medication assistance and for tobacco use cessation counseling. If your diarrhea returns please let us know. We will get labs today.

## 2019-02-18 NOTE — Assessment & Plan Note (Signed)
Uncontrolled off of metformin.  Will send Jardiance and.  We will get him set up with our clinical pharmacist for medication assistance as he is in the donut hole.  Continue Januvia.  Check lab work.

## 2019-02-18 NOTE — Progress Notes (Signed)
James Rumps, MD Phone: [phone]  CHARLY PARTIDA is a 78 y.o. male who presents today for follow-up.  Tobacco abuse: Patient is smoking 10 to 12 cigarettes daily.  He is trying to cut back on his own.  He has quit cold [place] previously.  The patches were not real effective.  He is already on Wellbutrin for psychiatric reasons.  Diabetes: Patient stopped metformin due to diarrhea.  He is on Januvia.  He feels well.  No polyuria or polydipsia.  He has been on glipizide previously.  Otherwise no diabetic medications tried.  CBG on the 27th was 212.  CBG earlier today was 416.  Chronic diarrhea: Patient notes this has resolved after stopping metformin.  He had this for many many years though has not had a single issue with this since stopping the metformin.  Vitamin D deficiency: He did start on vitamin D supplementation.  He will have this rechecked in about 6 weeks.  Social History   Tobacco Use  Smoking Status Current Every Day Smoker  . Packs/day: 0.50  . Years: 60.00  . Pack years: 30.00  . Types: Cigarettes  . Last attempt to quit: 04/08/2018  . Years since quitting: 0.8  Smokeless Tobacco Never Used     ROS see history of present illness  Objective  Physical Exam Vitals:   02/18/19 1550  BP: 140/70  Pulse: 97  Temp: 98 F (36.7 C)  SpO2: 97%    BP Readings from Last 3 Encounters:  02/18/19 140/70  02/02/19 120/70  08/26/18 114/76   Wt Readings from Last 3 Encounters:  02/18/19 183 lb 6.4 oz (83.2 kg)  02/02/19 183 lb 6.4 oz (83.2 kg)  08/26/18 180 lb (81.6 kg)    Physical Exam Constitutional:      General: He is not in acute distress.    Appearance: He is not diaphoretic.  Cardiovascular:     Rate and Rhythm: Normal rate and regular rhythm.     Heart sounds: Normal heart sounds.  Pulmonary:     Effort: Pulmonary effort is normal.     Breath sounds: Normal breath sounds.  Abdominal:     General: Bowel sounds are normal. There is no  distension.     Palpations: Abdomen is soft.     Tenderness: There is no abdominal tenderness. There is no guarding or rebound.  Skin:    General: Skin is warm and dry.  Neurological:     Mental Status: He is alert.      Assessment/Plan: Please see individual problem list.  Chronic diarrhea Resolved.  This was likely related to the metformin.  To monitor for recurrence.  Type 2 diabetes mellitus (HCC) Uncontrolled off of metformin.  Will send Jardiance and.  We will get him set up with our clinical pharmacist for medication assistance as he is in the donut hole.  Continue Januvia.  Check lab work.  Vitamin D deficiency Continue vitamin D.  Recheck in 6 weeks as planned.  Tobacco abuse Encouraged cessation.  Will refer to our clinical pharmacy for tobacco cessation counseling.   Orders Placed This Encounter  Procedures  . Basic Metabolic Panel (BMET)  . HgB A1c  . Ambulatory referral to Chronic Care Management Services    Referral Priority:   Routine    Referral Type:   Consultation    Referral Reason:   Care Coordination    Number of Visits Requested:   1    Meds ordered this encounter  Medications  .  empagliflozin (JARDIANCE) 10 MG TABS tablet    Sig: Take 10 mg by mouth daily before breakfast.    Dispense:  30 tablet    Refill:  3     James Rumps, MD [place]

## 2019-02-18 NOTE — Assessment & Plan Note (Signed)
Encouraged cessation.  Will refer to our clinical pharmacy for tobacco cessation counseling.

## 2019-02-19 ENCOUNTER — Telehealth: Payer: Self-pay | Admitting: Family Medicine

## 2019-02-19 LAB — BASIC METABOLIC PANEL
BUN: 31 mg/dL — ABNORMAL HIGH (ref 6–23)
CO2: 26 mEq/L (ref 19–32)
Calcium: 9.5 mg/dL (ref 8.4–10.5)
Chloride: 102 mEq/L (ref 96–112)
Creatinine, Ser: 1.48 mg/dL (ref 0.40–1.50)
GFR: 45.84 mL/min — ABNORMAL LOW (ref >60.00)
Glucose, Bld: 294 mg/dL — ABNORMAL HIGH (ref 70–99)
Potassium: 4.6 mEq/L (ref 3.5–5.1)
Sodium: 138 mEq/L (ref 135–145)

## 2019-02-19 LAB — HEMOGLOBIN A1C: Hgb A1c MFr Bld: 8.5 % — ABNORMAL HIGH (ref 4.6–6.5)

## 2019-02-19 NOTE — Telephone Encounter (Signed)
Pt stated empagliflozin (JARDIANCE) 10 MG TABS tablet is too expensive. Stated Dr. Caryl Bis would have the pharmacist try to work with him regarding rx. Please advise.

## 2019-02-19 NOTE — Telephone Encounter (Signed)
Dr. Caryl Bis placed a referral; a member of my team will be reaching out to the patient to schedule a phone call with me

## 2019-02-20 ENCOUNTER — Telehealth: Payer: Self-pay | Admitting: Family Medicine

## 2019-02-20 NOTE — Chronic Care Management (AMB) (Signed)
°  Chronic Care Management   Outreach Note  02/20/2019 Name: James Hood MRN: [id number] DOB: 1940-09-25  Referred by: Leone Haven, MD Reason for referral : Chronic Care Management (Initial CCM outreach was unsuccessful. )   An unsuccessful telephone outreach was attempted today. The patient was referred to the case management team by for assistance with care management and care coordination.   Follow Up Plan: A HIPPA compliant phone message was left for the patient providing contact information and requesting a return call.  The care management team will reach out to the patient again over the next 7 days.  If patient returns call to provider office, please advise to call [place] at [place]  [email or web address]   ??[phone]

## 2019-02-23 ENCOUNTER — Other Ambulatory Visit: Payer: Self-pay

## 2019-02-23 ENCOUNTER — Ambulatory Visit
Admission: RE | Admit: 2019-02-23 | Discharge: 2019-02-23 | Disposition: A | Discharge status: Home / Self Care | Payer: PPO | Source: Ambulatory Visit | Attending: Family Medicine | Admitting: Family Medicine

## 2019-02-23 DIAGNOSIS — E041 Nontoxic single thyroid nodule: Secondary | ICD-10-CM

## 2019-02-25 ENCOUNTER — Other Ambulatory Visit: Payer: Self-pay

## 2019-02-25 DIAGNOSIS — E1142 Type 2 diabetes mellitus with diabetic polyneuropathy: Secondary | ICD-10-CM

## 2019-02-25 NOTE — Progress Notes (Unsigned)
Basic metab

## 2019-02-27 ENCOUNTER — Encounter: Payer: Self-pay | Admitting: Family Medicine

## 2019-02-27 ENCOUNTER — Ambulatory Visit (INDEPENDENT_AMBULATORY_CARE_PROVIDER_SITE_OTHER): Payer: PPO | Admitting: Family Medicine

## 2019-02-27 ENCOUNTER — Ambulatory Visit (INDEPENDENT_AMBULATORY_CARE_PROVIDER_SITE_OTHER): Payer: PPO

## 2019-02-27 ENCOUNTER — Other Ambulatory Visit: Payer: Self-pay

## 2019-02-27 VITALS — BP 130/80 | HR 81 | Temp 97.8°F | Ht 71.0 in | Wt 179.6 lb

## 2019-02-27 DIAGNOSIS — G8929 Other chronic pain: Secondary | ICD-10-CM | POA: Insufficient documentation

## 2019-02-27 DIAGNOSIS — M25572 Pain in left ankle and joints of left foot: Secondary | ICD-10-CM

## 2019-02-27 NOTE — Patient Instructions (Addendum)
Nice to see you. We will get an x-ray of your left ankle and then determine if we need to refer you to an ankle specialist.

## 2019-02-27 NOTE — Assessment & Plan Note (Addendum)
Suspect osteoarthritis though we will obtain an x-ray to evaluate further.  Consider referral to an ankle specialist after the x-ray results.

## 2019-02-27 NOTE — Progress Notes (Signed)
  Tommi Rumps, MD Phone: [phone]  James Hood is a 78 y.o. male who presents today for same-day visit.  Left ankle pain: Patient notes this has been going on for years.  He had a fracture in his foot previously and wonders if it is related to that.  He reports having seen an orthopedist in [place] previously and had an ultrasound and cortisone injection with no benefit.  He notes it is along the lateral portion of his ankle just inferior and anterior to his lateral malleolus.  No recent injury.  No reported instability.  Oxycodone does not help.  He reports is a 7-8 out of 10.  He does not take NSAIDs related to kidney dysfunction.  Social History   Tobacco Use  Smoking Status Current Every Day Smoker  . Packs/day: 0.50  . Years: 60.00  . Pack years: 30.00  . Types: Cigarettes  . Last attempt to quit: 04/08/2018  . Years since quitting: 0.8  Smokeless Tobacco Never Used     ROS see history of present illness  Objective  Physical Exam Vitals:   02/27/19 1315  BP: 130/80  Pulse: 81  Temp: 97.8 F (36.6 C)  SpO2: 96%    BP Readings from Last 3 Encounters:  02/27/19 130/80  02/18/19 140/70  02/02/19 120/70   Wt Readings from Last 3 Encounters:  02/27/19 179 lb 9.6 oz (81.5 kg)  02/18/19 183 lb 6.4 oz (83.2 kg)  02/02/19 183 lb 6.4 oz (83.2 kg)    Physical Exam Constitutional:      General: He is not in acute distress.    Appearance: He is not diaphoretic.  Pulmonary:     Effort: Pulmonary effort is normal.  Musculoskeletal:       Feet:  Skin:    General: Skin is warm and dry.  Neurological:     Mental Status: He is alert.      Assessment/Plan: Please see individual problem list.  Chronic pain of left ankle Suspect osteoarthritis though we will obtain an x-ray to evaluate further.  Consider referral to an ankle specialist after the x-ray results.   Orders Placed This Encounter  Procedures  . DG Ankle Complete Left    Standing  Status:   Future    Standing Expiration Date:   04/28/2020    Order Specific Question:   Reason for Exam (SYMPTOM  OR DIAGNOSIS REQUIRED)    Answer:   chronic severe left ankle pain inferior and anterior to the lateral maleolus    Order Specific Question:   Preferred imaging location?    Answer:   [facility] Specific Question:   Radiology Contrast Protocol - do NOT remove file path    Answer:   \\[email or web address]s.pdf    No orders of the defined types were placed in this encounter.    Tommi Rumps, MD [place]

## 2019-03-02 ENCOUNTER — Ambulatory Visit (INDEPENDENT_AMBULATORY_CARE_PROVIDER_SITE_OTHER): Payer: PPO | Admitting: Pharmacist

## 2019-03-02 ENCOUNTER — Ambulatory Visit: Payer: Self-pay | Admitting: *Deleted

## 2019-03-02 ENCOUNTER — Telehealth: Payer: Self-pay | Admitting: Pharmacist

## 2019-03-02 DIAGNOSIS — E1142 Type 2 diabetes mellitus with diabetic polyneuropathy: Secondary | ICD-10-CM | POA: Diagnosis not present

## 2019-03-02 DIAGNOSIS — Z720 Tobacco use: Secondary | ICD-10-CM

## 2019-03-02 MED ORDER — NICOTINE 14 MG/24HR TD PT24: 14.0000 mg | MEDICATED_PATCH | Freq: Every day | TRANSDERMAL | 2 refills | Status: DC

## 2019-03-02 MED ORDER — NICOTINE POLACRILEX 4 MG MT LOZG: 4.0000 mg | LOZENGE | OROMUCOSAL | 0 refills | Status: DC | PRN

## 2019-03-02 MED ORDER — OZEMPIC (0.25 OR 0.5 MG/DOSE) 2 MG/1.5ML ~~LOC~~ SOPN: 0.5000 mg | PEN_INJECTOR | SUBCUTANEOUS | 0 refills | Status: DC

## 2019-03-02 NOTE — Chronic Care Management (AMB) (Signed)
Chronic Care Management   Note  03/02/2019 Name: James Hood MRN: [id number] DOB: Apr 21, 1941  [name] is a 78 y.o. year old male who is a primary care patient of Caryl Bis, Angela Adam, MD. I reached out to [name] by phone today in response to a referral sent by Mr. Jaymes Graff Thobe's health plan.     Mr. Wildes was given information about Chronic Care Management services today including:  1. CCM service includes personalized support from designated clinical staff supervised by his physician, including individualized plan of care and coordination with other care providers 2. 24/7 contact phone numbers for assistance for urgent and routine care needs. 3. Service will only be billed when office clinical staff spend 20 minutes or more in a month to coordinate care. 4. Only one practitioner may furnish and bill the service in a calendar month. 5. The patient may stop CCM services at any time (effective at the end of the month) by phone call to the office staff. 6. The patient will be responsible for cost sharing (co-pay) of up to 20% of the service fee (after annual deductible is met).  Patient agreed to services and verbal consent obtained.   Follow up plan: Telephone appointment with CCM team member scheduled for: 03/26/2019  [place]  [email or web address]   ??[phone]

## 2019-03-02 NOTE — Patient Instructions (Signed)
Visit Information  Goals Addressed            This Visit's Progress     Patient Stated   . "I want to quit smoking" (pt-stated)       Current Barriers:  . Tobacco abuse; currently smoking 1/2 ppd . Previous quit attempts, unsuccessful. Patient reports that he has never been able to quit smoking before. Reports he is interested in patches + lozenges . Reports that he is already connected with [facility], but would prefer not to utilize this service as he does not like the constant check-ins via text  . Reports smoking within 30 minutes of waking up  Pharmacist Clinical Goal(s):  Marland Kitchen Over the next 90 days, patient will work with PharmD and provider towards tobacco cessation  Interventions: . Recommend NRT patches + gum/lozenges. Started 14 mg patch and 4 mg lozenge based on current tobacco use. Patient will see if these are affordable at the pharmacy with his insurance. . Counseled on patch placement, side effects, and option to remove at night if they experience trouble sleeping or bad dreams.  . Counseled to allow lozenge to dissolve and absorb in cheek pocket, rather than swallow, to reduce GI side effects. . Discussed that [facility] (1-800-QUIT-NOW) can provide free patches and lozenges. If patient unable to afford NRT with insurance, will recommend he pursue patches and lozenges from this group.  Patient Self Care Activities:  . Patient will commit to reducing tobacco consumption  Initial goal documentation     . "My blood sugars are too high" (pt-stated)       Current Barriers:  . Diabetes: uncontrolled, most recent A1c 8.5%  . Current antihyperglycemic regimen: Jardiance 10 mg daily, Januvia 100 mg daily o Metformin recently d/c with complete resolution of diarrhea, though with significant elevations reported in sugars today  . Denies hypoglycemic symptoms, including dizziness, lightheadedness, shaking, sweating . Current blood glucose readings: all in  200-300s  Pharmacist Clinical Goal(s):  Marland Kitchen Over the next 90 days, patient will work with PharmD and primary care provider to address optimized glycemic management  Interventions: . Comprehensive medication review performed, medication list updated in electronic medical record . Discussed with patient that maximizing Jardiance unlikely to get patient to goal, given elevations reported today. Discussed addition of weekly GLP1 vs daily basal insulin. As diarrhea was completely resolved with stopping metformin, less likely that GLP1 would exacerbate any underlying GI concerns, so will start Ozempic 0.25 mg once weekly x 4 weeks, then increase to 0.5 mg once weekly . D/c Januvia d/t lack of benefit with duplicative mechanism . Continue Jardiance 10 mg once daily. Consider titration in the future . If patient develops GI upset, will plan to initiate basal insulin . If patient tolerates medication, will pursue patient assistance. Per initial report of total household income, patient will qualify for [employer] patient assistance.   Patient Self Care Activities:  . Patient will check blood glucose BID , document, and provide at future appointments . Patient will take medications as prescribed . Patient will report any questions or concerns to provider   Initial goal documentation        The patient verbalized understanding of instructions provided today and declined a print copy of patient instruction materials.   Plan: - Will outreach patient in ~3 weeks as scheduled for medication management support  Catie Darnelle Maffucci, PharmD, [place] Pharmacist [place] [phone]

## 2019-03-02 NOTE — Telephone Encounter (Signed)
James Hood,   Could you please prepare the sample of Ozempic for this patient? Inject 0.25 mg once weekly for 4 weeks, then increase to 0.5 mg once weekly.   Thanks!  Catie

## 2019-03-02 NOTE — Telephone Encounter (Signed)
Patient calling with cbg's today. Fasting morning cbg 217 and at noon cbg 223. He was taken off Metformin over one month ago due to diarrhea. Began taking Jardiance 10 mg tabs once daily on  02/18/19.  Reporting his blood sugars have been in the 200's since starting the Jardiance. Drinks 1/2 gallon of water daily, normally (this is his normal and has not increased) Denies N/V/diarrhea/dizziness/increased urinating/rapid breathing. Wife is inquiring if he may need to try metformin again? It is possible he was not eating immediately upon taking the metformin, previously. Pharmacy on file. Routing to provider for advice. Reviewed urgent sxs should they occur, call back.   Reason for Disposition . [1] Blood glucose > 300 mg/dL (16.7 mmol/L) AND [2] two or more times in a row  Answer Assessment - Initial Assessment Questions 1. BLOOD GLUCOSE: "What is your blood glucose level?"      223 at noon, 4 hours after noon 2. ONSET: "When did you check the blood glucose?"     today 3. USUAL RANGE: "What is your glucose level usually?" (e.g., usual fasting morning value, usual evening value)    Did not check before last few days 4. KETONES: "Do you check for ketones (urine or blood test strips)?" If yes, ask: "What does the test show now?"      no 5. TYPE 1 or 2:  "Do you know what type of diabetes you have?"  (e.g., Type 1, Type 2, Gestational; doesn't know)      Type 2  6. INSULIN: "Do you take insulin?" "What type of insulin(s) do you use? What is the mode of delivery? (syringe, pen; injection or pump)?"      no 7. DIABETES PILLS: "Do you take any pills for your diabetes?" If yes, ask: "Have you missed taking any pills recently?"     Yes, taking once daily as prescribed. 8. OTHER SYMPTOMS: "Do you have any symptoms?" (e.g., fever, frequent urination, difficulty breathing, dizziness, weakness, vomiting)     One day only time he experienced dizziness on week ago. 9. PREGNANCY: "Is there any chance you are  pregnant?" "When was your last menstrual period?"    na  Protocols used: DIABETES - HIGH BLOOD SUGAR-A-AH

## 2019-03-02 NOTE — Chronic Care Management (AMB) (Signed)
Chronic Care Management   Note  03/02/2019 Name: James Hood MRN: [id number] DOB: 24-May-1940   Subjective:  James Hood is a 78 y.o. year old male who is a primary care patient of Caryl Bis, Angela Adam, MD. The CCM team was consulted for assistance with chronic disease management and care coordination needs.    Contacted patient for medication access assistance and medication management assistance today.   Review of patient status, including review of consultants reports, laboratory and other test data, was performed as part of comprehensive evaluation and provision of chronic care management services.   Objective:  Lab Results  Component Value Date   CREATININE 1.48 02/18/2019   CREATININE 1.23 02/02/2019   CREATININE 1.30 10/30/2018    Lab Results  Component Value Date   HGBA1C 8.5 (H) 02/18/2019       Component Value Date/Time   CHOL 114 01/14/2019 0910   TRIG 220.0 (H) 01/14/2019 0910   HDL 33.50 (L) 01/14/2019 0910   CHOLHDL 3 01/14/2019 0910   VLDL 44.0 (H) 01/14/2019 0910   LDLCALC 57 09/25/2017 0924   LDLDIRECT 44.0 01/14/2019 0910    Clinical ASCVD: No    BP Readings from Last 3 Encounters:  02/27/19 130/80  02/18/19 140/70  02/02/19 120/70    No Known Allergies  Medications Reviewed Today    Reviewed by Gordy Councilman, CMA (Certified Medical Assistant) on 02/27/19 at 66  Med List Status: <None>  Medication Order Taking? Sig Documenting Provider Last Dose Status Informant  aspirin EC 81 MG tablet [id number] Yes Take 81 mg by mouth daily. [provider] Taking Active            Med Note Vanessa Annetta, Manuela Schwartz   Tue Apr 15, 2018  8:47 AM)    buPROPion [facility] SR) 150 MG 12 hr tablet [id number] Yes TAKE ONE TABLET BY MOUTH TWICE A DAY Leone Haven, MD Taking Active   cyclobenzaprine (FLEXERIL) 10 MG tablet [id number] Yes Take 1 tablet (10 mg total) by mouth 3 (three) times daily as needed for muscle spasms. Marin Olp, PA-C  Taking Active   diphenoxylate-atropine (LOMOTIL) 2.5-0.025 MG tablet [id number] Yes Take 1 tablet by mouth 2 (two) times daily.  [provider] Taking Active Self  divalproex (DEPAKOTE ER) 500 MG 24 hr tablet [id number] Yes Take 1 tablet by mouth 2 (two) times daily. [provider] Taking Active   donepezil (ARICEPT) 10 MG tablet [id number] Yes Take 10 mg by mouth at bedtime. [provider] Taking Active Self  empagliflozin (JARDIANCE) 10 MG TABS tablet [id number] Yes Take 10 mg by mouth daily before breakfast. Leone Haven, MD Taking Active   fenofibrate (TRICOR) 145 MG tablet [id number] Yes TAKE ONE TABLET BY MOUTH DAILY Leone Haven, MD Taking Active   finasteride (PROSCAR) 5 MG tablet [id number] Yes TAKE ONE TABLET BY MOUTH DAILY Leone Haven, MD Taking Active   gabapentin (NEURONTIN) 600 MG tablet [id number] Yes TAKE TWO TABLETS BY MOUTH THREE TIMES A DAY Leone Haven, MD Taking Active   lisinopril-hydrochlorothiazide (ZESTORETIC) 20-25 MG tablet [id number] Yes TAKE ONE TABLET BY MOUTH TWICE A DAY Leone Haven, MD Taking Active   Melatonin 10 MG TABS [id number] Yes Take 20 mg by mouth at bedtime. [provider] Taking Active Self  memantine (NAMENDA) 10 MG tablet [id number] Yes Take 10 mg by mouth 2 (two) times daily.  [provider] Taking Active Self  Med Note Sharlett Iles, Jae Dire Jul 18, 2016  1:40 PM)    metoprolol succinate (TOPROL-XL) 25 MG 24 hr tablet [id number] Yes Take 25 mg by mouth at bedtime.  [provider] Taking Active Self           Med Note Sharlett Iles, Jae Dire Jul 18, 2016  1:40 PM)    Multiple Vitamins-Minerals (CENTRUM SILVER PO) [id number] Yes Take 1 tablet by mouth daily. [provider] Taking Active   omeprazole (PRILOSEC) 20 MG capsule [id number] Yes Take 20 mg by mouth 2 (two) times daily before a meal.  [provider] Taking Active Self            Med Note Josiah Lobo, KIMBERLY   Thu Aug 09, 2016 12:05 PM)    oxyCODONE-acetaminophen (PERCOCET) 10-325 MG tablet [id number] Yes Take 1 tablet by mouth every 6 (six) hours as needed for pain. Must last 30 days. [name], MD Taking Active   primidone (MYSOLINE) 50 MG tablet [id number] Yes Take 150-250 mg by mouth 2 (two) times daily. Take 250 mg by mouth in the morning & take 150 mg at night. [provider] Taking Active Self  QUEtiapine (SEROQUEL) 25 MG tablet [id number] Yes Take 250 mg by mouth at bedtime.  [provider] Taking Active Self  rosuvastatin (CRESTOR) 40 MG tablet [id number] Yes Take 1 tablet (40 mg total) by mouth daily. Leone Haven, MD Taking Active   sitaGLIPtin (JANUVIA) 100 MG tablet [id number] Yes Take 1 tablet (100 mg total) by mouth daily. Leone Haven, MD Taking Active Self  tamsulosin (FLOMAX) 0.4 MG CAPS capsule [id number] Yes TAKE ONE CAPSULE BY MOUTH DAILY AFTER SUPPER Leone Haven, MD Taking Active   Vitamin D, Ergocalciferol, (DRISDOL) 1.25 MG (50000 UT) CAPS capsule [id number] Yes Take 1 capsule (50,000 Units total) by mouth every 7 (seven) days. Leone Haven, MD Taking Active   Med List Note Landis Martins, [place] 08/26/18 [id number]): MR 11-29-2018 UDS 04/15/2018           Assessment:   Goals Addressed            This Visit's Progress     Patient Stated   . "I want to quit smoking" (pt-stated)       Current Barriers:  . Tobacco abuse; currently smoking 1/2 ppd . Previous quit attempts, unsuccessful. Patient reports that he has never been able to quit smoking before. Reports he is interested in patches + lozenges . Reports that he is already connected with [facility] [facility], but would prefer not to utilize this service as he does not like the constant check-ins via text  . Reports smoking within 30 minutes of waking up  Pharmacist Clinical Goal(s):  Marland Kitchen Over the next 90 days, patient will work with PharmD and provider  towards tobacco cessation  Interventions: . Recommend NRT patches + gum/lozenges. Started 14 mg patch and 4 mg lozenge based on current tobacco use. Patient will see if these are affordable at the pharmacy with his insurance. . Counseled on patch placement, side effects, and option to remove at night if they experience trouble sleeping or bad dreams.  . Counseled to allow lozenge to dissolve and absorb in cheek pocket, rather than swallow, to reduce GI side effects. . Discussed that [facility] (1-800-QUIT-NOW) can provide free patches and lozenges. If patient unable to afford NRT with insurance, will recommend he pursue patches and lozenges from this group.  Patient Self Care Activities:  . Patient will commit to reducing tobacco consumption  Initial goal documentation     . "My blood sugars are too high" (pt-stated)       Current Barriers:  . Diabetes: uncontrolled, most recent A1c 8.5%  . Current antihyperglycemic regimen: Jardiance 10 mg daily, Januvia 100 mg daily o Metformin recently d/c with complete resolution of diarrhea, though with significant elevations reported in sugars today  . Denies hypoglycemic symptoms, including dizziness, lightheadedness, shaking, sweating . Current blood glucose readings: all in 200-300s  Pharmacist Clinical Goal(s):  Marland Kitchen Over the next 90 days, patient will work with PharmD and primary care provider to address optimized glycemic management  Interventions: . Comprehensive medication review performed, medication list updated in electronic medical record . Discussed with patient that maximizing Jardiance unlikely to get patient to goal, given elevations reported today. Discussed addition of weekly GLP1 vs daily basal insulin. As diarrhea was completely resolved with stopping metformin, less likely that GLP1 would exacerbate any underlying GI concerns, so will start Ozempic 0.25 mg once weekly x 4 weeks, then increase to 0.5 mg once weekly . D/c Januvia  d/t lack of benefit with duplicative mechanism . Continue Jardiance 10 mg once daily. Consider titration in the future . If patient develops GI upset, will plan to initiate basal insulin . If patient tolerates medication, will pursue patient assistance. Per initial report of total household income, patient will qualify for [employer] patient assistance.   Patient Self Care Activities:  . Patient will check blood glucose BID , document, and provide at future appointments . Patient will take medications as prescribed . Patient will report any questions or concerns to provider   Initial goal documentation        Plan: - Will outreach patient in ~3 weeks as scheduled for medication management support  Catie Darnelle Maffucci, PharmD, [place] Pharmacist [place] [phone]

## 2019-03-02 NOTE — Chronic Care Management (AMB) (Signed)
°  Chronic Care Management   Outreach Note  03/02/2019 Name: James Hood MRN: [id number] DOB: 11-Feb-1941  Referred by: Leone Haven, MD Reason for referral : Chronic Care Management (Initial CCM outreach was unsuccessful. ) and Chronic Care Management (Second CCM outreach was unsuccessul. )   A second unsuccessful telephone outreach was attempted today. The patient was referred to the case management team for assistance with care management and care coordination.   Follow Up Plan: A HIPPA compliant phone message was left for the patient providing contact information and requesting a return call.  The care management team will reach out to the patient again over the next 7 days.  If patient returns call to provider office, please advise to call [place] at [place]  [email or web address]   [phone]

## 2019-03-03 NOTE — Telephone Encounter (Signed)
Noted. Catie discussed this with him during his CCM visit yesterday.

## 2019-03-03 NOTE — Telephone Encounter (Signed)
If possible, that would be great! He has used insulin pens before though, so should be fine if not

## 2019-03-03 NOTE — Telephone Encounter (Signed)
Left message for patient to pick up medication will patient require teaching?

## 2019-03-03 NOTE — Telephone Encounter (Signed)
Patient picked up medication and will Start on Friday we did go over injection teaching and patient verbalized understanding. Patient wife is out of town and he wanted to wait until her return to start Lindsay on Friday. I advised patient he could return on Friday if he wanted to take first injection here with instruction.

## 2019-03-05 ENCOUNTER — Telehealth: Payer: Self-pay

## 2019-03-05 DIAGNOSIS — G8929 Other chronic pain: Secondary | ICD-10-CM

## 2019-03-05 DIAGNOSIS — M25572 Pain in left ankle and joints of left foot: Secondary | ICD-10-CM

## 2019-03-05 NOTE — Telephone Encounter (Signed)
Copied from [place] [phone]. Topic: General - Other >> Mar 05, 2019  4:16 PM Ivar Drape wrote: Reason for CRM:   Patient stated he was referred to a Podiatrist and he would like to see the following:  Dr. Ila Mcgill, [address] [place]. [place] [address]  [place]: [phone] Fax: [phone]

## 2019-03-06 ENCOUNTER — Telehealth: Payer: Self-pay | Admitting: Family Medicine

## 2019-03-06 NOTE — Telephone Encounter (Signed)
Patient stated he was referred to a Podiatrist and he would like to see the following:  Dr. Ila Mcgill, [address] [place] [address]  [place]: [phone] Fax: [phone]  Mekaila Tarnow,cma

## 2019-03-06 NOTE — Telephone Encounter (Signed)
Pt called and stated that he would like Dr Caryl Bis to know that he is having 10 teeth extracted 03/11/19 and would like to know if he would prescribe something for the trauma. Please advise.

## 2019-03-06 NOTE — Telephone Encounter (Signed)
Referral placed.

## 2019-03-09 ENCOUNTER — Encounter: Payer: Self-pay | Admitting: Student in an Organized Health Care Education/Training Program

## 2019-03-09 DIAGNOSIS — E041 Nontoxic single thyroid nodule: Secondary | ICD-10-CM | POA: Diagnosis not present

## 2019-03-10 ENCOUNTER — Other Ambulatory Visit: Payer: Self-pay

## 2019-03-10 ENCOUNTER — Encounter: Payer: Self-pay | Admitting: Student in an Organized Health Care Education/Training Program

## 2019-03-10 ENCOUNTER — Ambulatory Visit
Payer: PPO | Attending: Student in an Organized Health Care Education/Training Program | Admitting: Student in an Organized Health Care Education/Training Program

## 2019-03-10 VITALS — BP 112/74 | HR 67 | Temp 98.2°F | Ht 71.0 in | Wt 180.0 lb

## 2019-03-10 DIAGNOSIS — F1190 Opioid use, unspecified, uncomplicated: Secondary | ICD-10-CM | POA: Diagnosis not present

## 2019-03-10 DIAGNOSIS — M961 Postlaminectomy syndrome, not elsewhere classified: Secondary | ICD-10-CM | POA: Insufficient documentation

## 2019-03-10 DIAGNOSIS — M19072 Primary osteoarthritis, left ankle and foot: Secondary | ICD-10-CM | POA: Diagnosis not present

## 2019-03-10 DIAGNOSIS — M47816 Spondylosis without myelopathy or radiculopathy, lumbar region: Secondary | ICD-10-CM | POA: Diagnosis not present

## 2019-03-10 DIAGNOSIS — G894 Chronic pain syndrome: Secondary | ICD-10-CM

## 2019-03-10 DIAGNOSIS — M5417 Radiculopathy, lumbosacral region: Secondary | ICD-10-CM

## 2019-03-10 DIAGNOSIS — T85192S Other mechanical complication of implanted electronic neurostimulator (electrode) of spinal cord, sequela: Secondary | ICD-10-CM | POA: Diagnosis not present

## 2019-03-10 DIAGNOSIS — M5442 Lumbago with sciatica, left side: Secondary | ICD-10-CM | POA: Insufficient documentation

## 2019-03-10 DIAGNOSIS — G8929 Other chronic pain: Secondary | ICD-10-CM | POA: Insufficient documentation

## 2019-03-10 DIAGNOSIS — M5386 Other specified dorsopathies, lumbar region: Secondary | ICD-10-CM | POA: Insufficient documentation

## 2019-03-10 MED ORDER — HYDROMORPHONE HCL 4 MG PO TABS: 4.0000 mg | ORAL_TABLET | Freq: Three times a day (TID) | ORAL | 0 refills | Status: DC | PRN

## 2019-03-10 NOTE — Progress Notes (Signed)
Patient's Name: James Hood  MRN: [id number]  Referring Provider: Leone Haven, MD  DOB: Dec 22, 1940  PCP: Leone Haven, MD  DOS: 03/10/2019  Note by: [name], MD  Service setting: Ambulatory outpatient  Attending: [name], MD  Location: [facility] (AMB) Pain Management Facility  Specialty: Interventional Pain Management  Patient type: Established   Primary Reason(s) for Visit: Encounter for prescription drug management. (Level of risk: moderate)  CC: Hip Pain  HPI  Mr. Spinello is a 78 y.o. year old, male patient, who comes today for a medication management evaluation. He has Hypertension; Tremor, essential; Primary localized osteoarthritis of right knee; Anxiety and depression; DJD (degenerative joint disease) of knee; Trochanteric bursitis of left hip; Headache; Falls; Barrett's esophagus; Chronic lumbar pain; Benign prostatic hyperplasia; Benign essential tremor; Benign neoplasm of colon; Chronic diarrhea; Type 2 diabetes mellitus ([place]); Difficulty in walking; HLD (hyperlipidemia); Amnesia; Testicular hypofunction; Absence of sensation; Episode of syncope; Tobacco abuse; Hypertriglyceridemia; Anemia; Lumbar herniated disc; Night sweats; Right hip pain; Purpura ([place]); Thyroid nodule; Fatty liver; S/P insertion of spinal cord stimulator; Chronic pain syndrome; Failed back surgical syndrome; Lumbosacral radiculopathy; Primary osteoarthritis of left ankle; Facet arthritis of lumbar region; Wound infection complicating hardware ([place]); Neuropathy; Left shoulder pain; Vitamin D deficiency; and Chronic pain of left ankle on their problem list. His primarily concern today is the Hip Pain  Pain Assessment: Location: Right, Left Hip Radiating: pain in hip radiaties down leg , ankle pain radaities up to mid leg Onset: More than a month ago Duration: Chronic pain Quality: Aching, Sharp, Constant Severity: 6 /10 (subjective, self-reported pain score)  Note: Reported level is compatible  with observation.                         When using our objective Pain Scale, levels between 6 and 10/10 are said to belong in an emergency room, as it progressively worsens from a 6/10, described as severely limiting, requiring emergency care not usually available at an outpatient pain management facility. At a 6/10 level, communication becomes difficult and requires great effort. Assistance to reach the emergency department may be required. Facial flushing and profuse sweating along with potentially dangerous increases in heart rate and blood pressure will be evident. Effect on ADL: limits my daily activities Timing: Constant Modifying factors: laying, medications BP: 112/74  HR: 67  Mr. Lahaie was last scheduled for an appointment on 11/25/2018 for medication management. During today's appointment we reviewed Mr. Bortner's chronic pain status, as well as his outpatient medication regimen.  Patient presents today continuing to endorse left low back, left hip and left posterior thigh pain.  He is also having left ankle pain.  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  He is considering getting it replaced.  I discussed with him that if he would like to pursue this, we do not need to repeat another spinal cord stimulator trial for that we can just proceed with a implant and I can refer him to a surgeon that we will be able to do that.  Regards to medication management, patient states that the oxycodone while it is beneficial is not providing as great analgesic benefit.  We did discuss opioid rotation to hydromorphone.  Risks and benefits reviewed and patient would like to trial that.  The patient  reports no history of drug use. His body mass index is 25.1 kg/m.  Further details on both, my  assessment(s), as well as the proposed treatment plan, please see below.  Controlled Substance Pharmacotherapy Assessment REMS (Risk Evaluation and Mitigation Strategy)   Analgesic:  03/04/2019  1   11/25/2018  Oxycodone-Acetaminophen 10-325  100.00  25 Bi Lat   [id number]   Har ([id number])   0  60.00 MME  Medicare   [place]   Pharmacokinetics: Liberation and absorption (onset of action): WNL Distribution (time to peak effect): WNL Metabolism and excretion (duration of action): WNL         Pharmacodynamics: Desired effects: Analgesia: Mr. Dearinger reports >50% benefit. Functional ability: Patient reports that medication allows him to accomplish basic ADLs Clinically meaningful improvement in function (CMIF): Sustained CMIF goals met Perceived effectiveness: Described as relatively effective, allowing for increase in activities of daily living (ADL) Undesirable effects: Side-effects or Adverse reactions: None reported Monitoring: [place] PMP: PDMP not reviewed this encounter. Online review of the past 34-monthperiod conducted. Compliant with practice rules and regulations Last UDS on record: Summary  Date Value Ref Range Status  04/15/2018 FINAL  Final    Comment:    ==================================================================== TOXASSURE SELECT 13 (MW) ==================================================================== Test                             Result       Flag       Units Drug Present and Declared for Prescription Verification   Oxycodone                      133          EXPECTED   ng/mg creat   Oxymorphone                    70           EXPECTED   ng/mg creat   Noroxycodone                   1417         EXPECTED   ng/mg creat   Noroxymorphone                 83           EXPECTED   ng/mg creat    Sources of oxycodone are scheduled prescription medications.    Oxymorphone, noroxycodone, and noroxymorphone are expected    metabolites of oxycodone. Oxymorphone is also available as a    scheduled prescription medication.   Phenobarbital                  PRESENT      EXPECTED    Phenobarbital is an expected metabolite of primidone;     Phenobarbital may also be administered as a prescription drug. ==================================================================== Test                      Result    Flag   Units      Ref Range   Creatinine              76               mg/dL      >=20 ==================================================================== Declared Medications:  The flagging and interpretation on this report are based on the  following declared medications.  Unexpected results may arise from  inaccuracies in the declared medications.  **Note: The testing scope of this panel includes these medications:  Oxycodone (  Oxycodone Acetaminophen)  Primidone (Mysoline)  **Note: The testing scope of this panel does not include following  reported medications:  Acetaminophen (Oxycodone Acetaminophen)  Aspirin (Aspirin 81)  Atropine (Lomotil)  Bupropion  Cefepime  Cyclobenzaprine  Diphenoxylate (Lomotil)  Divalproex (Depakote)  Donepezil (Aricept)  Fenofibrate (Tricor)  Finasteride  Gabapentin  Hydrochlorothiazide (Prinzide)  Lisinopril (Prinzide)  Melatonin  Memantine  Metformin  Metoprolol  Multivitamin  Omeprazole  Quetiapine  Simvastatin  Sitagliptin (Januvia)  Tamsulosin ==================================================================== For clinical consultation, please call [phone]. ====================================================================    UDS interpretation: Compliant          Medication Assessment Form: Reviewed. Patient indicates being compliant with therapy Treatment compliance: Compliant Risk Assessment Profile: Aberrant behavior: See initial evaluations. None observed or detected today Comorbid factors increasing risk of overdose: See initial evaluation. No additional risks detected today Opioid risk tool (ORT):  Opioid Risk  06/17/2018  Alcohol 0  Illegal Drugs 0  Rx Drugs 0  Alcohol 3  Illegal Drugs 0  Rx Drugs 0  Age between 16-45 years  0  History of  Preadolescent Sexual Abuse 0  Psychological Disease 0  ADD -  OCD -  Bipolar -  Depression 0  Opioid Risk Tool Scoring 3  Opioid Risk Interpretation Low Risk    ORT Scoring interpretation table:  Score <3 = Low Risk for SUD  Score between 4-7 = Moderate Risk for SUD  Score >8 = High Risk for Opioid Abuse   Risk of substance use disorder (SUD): Low  Risk Mitigation Strategies:  Patient Counseling: Covered Patient-Prescriber Agreement (PPA): Present and active  Notification to other healthcare providers: Done  Pharmacologic Plan: Opioid cross rotation to hydromorphone 4 mg 3 times daily as needed to see if it is more effective than oxycodone.             Laboratory Chemistry Profile   Screening Lab Results  Component Value Date   STAPHAUREUS NEGATIVE 08/15/2017   MRSAPCR NEGATIVE 08/15/2017   HIV NONREACTIVE 06/19/2016    Inflammation (CRP: Acute Phase) (ESR: Chronic Phase) Lab Results  Component Value Date   ESRSEDRATE 11 01/29/2017                         Rheumatology No results found for: RF, ANA, LABURIC, URICUR, LYMEIGGIGMAB, LYMEABIGMQN, HLAB27                      Renal Lab Results  Component Value Date   BUN 31 (H) 02/18/2019   CREATININE 1.48 02/18/2019   GFR 45.84 (L) 02/18/2019   GFRAA >60 08/15/2017   GFRNONAA >60 08/15/2017                             Hepatic Lab Results  Component Value Date   AST 21 02/02/2019   ALT 16 02/02/2019   ALBUMIN 3.9 02/02/2019   ALKPHOS 46 02/02/2019   AMMONIA 14 08/09/2016                        Electrolytes Lab Results  Component Value Date   NA 138 02/18/2019   K 4.6 02/18/2019   CL 102 02/18/2019   CALCIUM 9.5 02/18/2019                        Neuropathy Lab Results  Component Value Date   VITAMINB12 341  02/02/2019   FOLATE 13.3 03/19/2018   HGBA1C 8.5 (H) 02/18/2019   HIV NONREACTIVE 06/19/2016                        CNS No results found for: COLORCSF, APPEARCSF, RBCCOUNTCSF, WBCCSF,  POLYSCSF, LYMPHSCSF, EOSCSF, PROTEINCSF, GLUCCSF, JCVIRUS, CSFOLI, IGGCSF, LABACHR, ACETBL                      Bone Lab Results  Component Value Date   VD25OH 13.94 (L) 02/02/2019                         Coagulation Lab Results  Component Value Date   INR 0.91 04/01/2018   LABPROT 12.2 04/01/2018   APTT 35 04/01/2018   PLT 258.0 02/02/2019                        Cardiovascular Lab Results  Component Value Date   CKTOTAL 468 (H) 08/14/2016   HGB 11.3 (L) 02/02/2019   HCT 33.5 (L) 02/02/2019                         ID Lab Results  Component Value Date   HIV NONREACTIVE 06/19/2016   STAPHAUREUS NEGATIVE 08/15/2017   MRSAPCR NEGATIVE 08/15/2017    Cancer No results found for: CEA, CA125, LABCA2                      Endocrine Lab Results  Component Value Date   TSH 1.01 02/02/2019                        Note: Lab results reviewed.  Recent Diagnostic Imaging Results  DG Ankle Complete Left CLINICAL DATA:  Chronic severe left ankle pain inferior and anterior to the lateral malleolus  EXAM: LEFT ANKLE COMPLETE - 3+ VIEW  COMPARISON:  None.  FINDINGS: No evidence of acute fracture, traumatic malalignment or joint effusion. Small bony spurring noted along the medial tip of the lateral malleolus may reflect chronic enthesopathic change or remote posttraumatic change. Additional degenerative spurring is noted at the tibiotalar joint and along the medial malleolus. Inferior calcaneal spur is present. Midfoot and hindfoot alignment is grossly preserved though incompletely assessed on nondedicated, nonweightbearing films.  IMPRESSION: 1. No acute osseous abnormality. 2. Small bony spurring along the mesial tip of the lateral malleolus may reflect chronic enthesopathic change or remote posttraumatic change.  Electronically Signed   By: Lovena Le M.D.   On: 02/27/2019 20:06  Complexity Note: Imaging results reviewed. Results shared with Mr. Schweitzer, using  [place]'s terms.                               Meds   Current Outpatient Medications:  .  aspirin EC 81 MG tablet, Take 81 mg by mouth daily., Disp: , Rfl:  .  buPROPion (WELLBUTRIN SR) 150 MG 12 hr tablet, TAKE ONE TABLET BY MOUTH TWICE A DAY, Disp: 180 tablet, Rfl: 0 .  diphenoxylate-atropine (LOMOTIL) 2.5-0.025 MG tablet, Take 1 tablet by mouth 2 (two) times daily. , Disp: , Rfl:  .  divalproex (DEPAKOTE ER) 500 MG 24 hr tablet, Take 1 tablet by mouth 2 (two) times daily., Disp: , Rfl:  .  donepezil (ARICEPT) 10 MG tablet, Take  10 mg by mouth at bedtime., Disp: , Rfl:  .  empagliflozin (JARDIANCE) 10 MG TABS tablet, Take 10 mg by mouth daily before breakfast., Disp: 30 tablet, Rfl: 3 .  fenofibrate (TRICOR) 145 MG tablet, TAKE ONE TABLET BY MOUTH DAILY, Disp: 90 tablet, Rfl: 1 .  finasteride (PROSCAR) 5 MG tablet, TAKE ONE TABLET BY MOUTH DAILY, Disp: 90 tablet, Rfl: 1 .  gabapentin (NEURONTIN) 600 MG tablet, TAKE TWO TABLETS BY MOUTH THREE TIMES A DAY, Disp: 180 tablet, Rfl: 0 .  lisinopril-hydrochlorothiazide (ZESTORETIC) 20-25 MG tablet, TAKE ONE TABLET BY MOUTH TWICE A DAY, Disp: 180 tablet, Rfl: 1 .  Melatonin 10 MG TABS, Take 20 mg by mouth at bedtime., Disp: , Rfl:  .  memantine (NAMENDA) 10 MG tablet, Take 10 mg by mouth 2 (two) times daily. , Disp: , Rfl:  .  metoprolol succinate (TOPROL-XL) 25 MG 24 hr tablet, Take 25 mg by mouth at bedtime. , Disp: , Rfl:  .  Multiple Vitamins-Minerals (CENTRUM SILVER PO), Take 1 tablet by mouth daily., Disp: , Rfl:  .  omeprazole (PRILOSEC) 20 MG capsule, Take 20 mg by mouth 2 (two) times daily before a meal. , Disp: , Rfl:  .  primidone (MYSOLINE) 50 MG tablet, Take 150-250 mg by mouth 2 (two) times daily. Take 250 mg by mouth in the morning & take 150 mg at night., Disp: , Rfl:  .  QUEtiapine (SEROQUEL) 25 MG tablet, Take 250 mg by mouth at bedtime. , Disp: , Rfl:  .  rosuvastatin (CRESTOR) 40 MG tablet, Take 1 tablet (40 mg total) by mouth  daily., Disp: 90 tablet, Rfl: 1 .  Semaglutide,0.25 or 0.5MG/DOS, (OZEMPIC, 0.25 OR 0.5 MG/DOSE,) 2 MG/1.5ML SOPN, Inject 0.5 mg into the skin once a week., Disp: 1 pen, Rfl: 0 .  tamsulosin (FLOMAX) 0.4 MG CAPS capsule, TAKE ONE CAPSULE BY MOUTH DAILY AFTER SUPPER, Disp: 90 capsule, Rfl: 0 .  Vitamin D, Ergocalciferol, (DRISDOL) 1.25 MG (50000 UT) CAPS capsule, Take 1 capsule (50,000 Units total) by mouth every 7 (seven) days., Disp: 8 capsule, Rfl: 0 .  cyclobenzaprine (FLEXERIL) 10 MG tablet, Take 1 tablet (10 mg total) by mouth 3 (three) times daily as needed for muscle spasms. (Patient not taking: Reported on 03/09/2019), Disp: 60 tablet, Rfl: 2 .  [START ON 03/18/2019] HYDROmorphone (DILAUDID) 4 MG tablet, Take 1 tablet (4 mg total) by mouth every 8 (eight) hours as needed for severe pain., Disp: 90 tablet, Rfl: 0 .  nicotine (NICODERM CQ) 14 mg/24hr patch, Place 1 patch (14 mg total) onto the skin daily. (Patient not taking: Reported on 03/09/2019), Disp: 28 patch, Rfl: 2 .  nicotine polacrilex (COMMIT) 4 MG lozenge, Take 1 lozenge (4 mg total) by mouth as needed for smoking cessation. (Patient not taking: Reported on 03/09/2019), Disp: 100 tablet, Rfl: 0  ROS  Constitutional: Denies any fever or chills Gastrointestinal: No reported hemesis, hematochezia, vomiting, or acute GI distress Musculoskeletal: Denies any acute onset joint swelling, redness, loss of ROM, or weakness Neurological: No reported episodes of acute onset apraxia, aphasia, dysarthria, agnosia, amnesia, paralysis, loss of coordination, or loss of consciousness  Allergies  Mr. Bumbaugh has No Known Allergies.  [place]  Drug: Mr. Sizemore  reports no history of drug use. Alcohol:  reports previous alcohol use. Tobacco:  reports that he has been smoking cigarettes. He has a 30.00 pack-year smoking history. He has never used smokeless tobacco. Medical:  has a past medical history of Anxiety,  Anxiety and depression, Arthritis,  BPH (benign prostatic hyperplasia), Chronic bilateral low back pain with left-sided sciatica (07/2017), Colon polyps, Dementia ([place]), Depression, Diabetes mellitus type 2 in nonobese ([place]), GERD (gastroesophageal reflux disease), Headache, 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[place]), History of hiatal hernia, Hypercholesteremia, Hypertension, Hyperthyroidism, Neuropathic pain, Primary localized osteoarthritis of right knee, Stomach ulcer, and Tremor, essential. Surgical: Mr. Schoffstall  has a past surgical history that includes esophageal stretch; Tonsillectomy; Total knee arthroplasty (Right, 11/15/2014); Lumbar laminectomy/decompression microdiscectomy (Left, 02/03/2016); Colonoscopy with propofol (N/A, 09/14/2016); Joint replacement (Right, 2016); Pulse generator implant (N/A, 08/21/2017); Back surgery; and Pulse generator implant (Right, 04/02/2018). Family: family history includes Depression in his father; Heart attack in his mother; Heart disease in his mother; Hypertension in his father.  Constitutional Exam  General appearance: Well nourished, well developed, and well hydrated. In no apparent acute distress Vitals:   03/10/19 0952  BP: 112/74  Pulse: 67  Temp: 98.2 F (36.8 C)  SpO2: 97%  Weight: 180 lb (81.6 kg)  Height: 5' 11" (1.803 m)   BMI Assessment: Estimated body mass index is 25.1 kg/m as calculated from the following:   Height as of this encounter: 5' 11" (1.803 m).   Weight as of this encounter: 180 lb (81.6 kg).  BMI interpretation table: BMI level Category Range association with higher incidence of chronic pain  <18 kg/m2 Underweight   18.5-24.9 kg/m2 Ideal body weight   25-29.9 kg/m2 Overweight Increased incidence by 20%  30-34.9 kg/m2 Obese (Class I) Increased incidence by 68%  35-39.9 kg/m2 Severe obesity (Class II) Increased incidence by 136%  >40 kg/m2 Extreme obesity (Class III) Increased incidence by 254%   Patient's current BMI Ideal Body weight  Body mass index is 25.1 kg/m.  Ideal body weight: 75.3 kg (166 lb 0.1 oz) Adjusted ideal body weight: 77.8 kg (171 lb 9.7 oz)   BMI Readings from Last 4 Encounters:  03/10/19 25.10 kg/m  02/27/19 25.05 kg/m  02/18/19 25.58 kg/m  02/02/19 25.58 kg/m   Wt Readings from Last 4 Encounters:  03/10/19 180 lb (81.6 kg)  02/27/19 179 lb 9.6 oz (81.5 kg)  02/18/19 183 lb 6.4 oz (83.2 kg)  02/02/19 183 lb 6.4 oz (83.2 kg)  Psych/Mental status: Alert, oriented x 3 (person, place, & time)       Eyes: PERLA Respiratory: No evidence of acute respiratory distress  Thoracic Spine Area Exam  Skin & Axial Inspection: No masses, redness, or swelling Alignment: Symmetrical Functional ROM: Unrestricted ROM Stability: No instability detected Muscle Tone/Strength: Functionally intact. No obvious neuro-muscular anomalies detected. Sensory (Neurological): Unimpaired Muscle strength & Tone: No palpable anomalies  Lumbar Spine Area Exam  Skin & Axial Inspection: Well healed scar from previous spine surgery detected Alignment: Symmetrical Functional ROM: Decreased ROM       Stability: No instability detected Muscle Tone/Strength: Functionally intact. No obvious neuro-muscular anomalies detected. Sensory (Neurological): Dermatomal pain pattern   Gait & Posture Assessment  Ambulation: Limited Gait: Antalgic Posture: Difficulty standing up straight, due to pain   Lower Extremity Exam    Side: Right lower extremity  Side: Left lower extremity  Stability: No instability observed          Stability: No instability observed          Skin & Extremity Inspection: Skin color, temperature, and hair growth are WNL. No peripheral edema or cyanosis. No masses, redness, swelling, asymmetry, or associated skin lesions. No contractures.  Skin & Extremity Inspection: Skin color, temperature, and hair growth are  WNL. No peripheral edema or cyanosis. No masses, redness, swelling, asymmetry, or associated skin lesions. No contractures.   Functional ROM: Unrestricted ROM                  Functional ROM: Decreased ROM for hip and knee joints          Muscle Tone/Strength: Functionally intact. No obvious neuro-muscular anomalies detected.  Muscle Tone/Strength: Functionally intact. No obvious neuro-muscular anomalies detected.  Sensory (Neurological): Unimpaired        Sensory (Neurological): Dermatomal pain pattern and musculoskeletal        DTR: Patellar: deferred today Achilles: deferred today Plantar: deferred today  DTR: Patellar: deferred today Achilles: deferred today Plantar: deferred today  Palpation: No palpable anomalies  Palpation: No palpable anomalies   Assessment   Status Diagnosis  Persistent Having a Flare-up Persistent 1. Failed back surgical syndrome   2. Chronic left-sided low back pain with left-sided sciatica   3. Lumbosacral radiculopathy   4. Chronic pain syndrome   5. Primary osteoarthritis of left ankle   6. 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 Facet arthritis of lumbar region   8. Sciatica of left side associated with disorder of lumbar spine   9. Chronic, continuous use of opioids      Plan of Care  Pharmacotherapy (Medications Ordered): Meds ordered this encounter  Medications  . HYDROmorphone (DILAUDID) 4 MG tablet    Sig: Take 1 tablet (4 mg total) by mouth every 8 (eight) hours as needed for severe pain.    Dispense:  90 tablet    Refill:  0   Planned follow-up:   Return in about 5 weeks (around 04/14/2019) for Medication Management, in person.   Recent Visits No visits were found meeting these conditions.  Showing recent visits within past 90 days and meeting all other requirements   Today's Visits Date Type Provider Dept  03/10/19 Office Visit [name], MD [facility]  Showing today's visits and meeting all other requirements   Future Appointments Date Type Provider Dept  04/14/19 Appointment [name], MD [facility]  Showing future appointments within next 90 days and meeting all other requirements   Primary Care Physician: Leone Haven, MD Location: [facility] Note by: [name], MD Date: 03/10/2019; Time: 10:43 AM  Note: This dictation was prepared with Dragon dictation. Any transcriptional errors that may result from this process are unintentional.

## 2019-03-10 NOTE — Telephone Encounter (Signed)
Noted. Agree that the dentist soul prescribe any medication that is needed.

## 2019-03-10 NOTE — Telephone Encounter (Signed)
I left pt a voicemail to contact the office that will be doing his extractions for medication. I also noticed that he is scheduled for labs the day after his dental visit & mentioned on the voicemail that he can call to reschedule if he needs to.

## 2019-03-12 ENCOUNTER — Other Ambulatory Visit: Payer: PPO

## 2019-03-13 ENCOUNTER — Other Ambulatory Visit: Payer: Self-pay

## 2019-03-13 ENCOUNTER — Other Ambulatory Visit (INDEPENDENT_AMBULATORY_CARE_PROVIDER_SITE_OTHER): Payer: PPO

## 2019-03-13 DIAGNOSIS — E559 Vitamin D deficiency, unspecified: Secondary | ICD-10-CM

## 2019-03-13 DIAGNOSIS — E1142 Type 2 diabetes mellitus with diabetic polyneuropathy: Secondary | ICD-10-CM

## 2019-03-13 LAB — COMPREHENSIVE METABOLIC PANEL
ALT: 17 U/L (ref 0–53)
AST: 24 U/L (ref 0–37)
Albumin: 4.2 g/dL (ref 3.5–5.2)
Alkaline Phosphatase: 49 U/L (ref 39–117)
BUN: 32 mg/dL — ABNORMAL HIGH (ref 6–23)
CO2: 29 mEq/L (ref 19–32)
Calcium: 9.9 mg/dL (ref 8.4–10.5)
Chloride: 96 mEq/L (ref 96–112)
Creatinine, Ser: 1.54 mg/dL — ABNORMAL HIGH (ref 0.40–1.50)
GFR: 43.78 mL/min — ABNORMAL LOW (ref >60.00)
Glucose, Bld: 318 mg/dL — ABNORMAL HIGH (ref 70–99)
Potassium: 4.1 mEq/L (ref 3.5–5.1)
Sodium: 135 mEq/L (ref 135–145)
Total Bilirubin: 0.3 mg/dL (ref 0.2–1.2)
Total Protein: 6.9 g/dL (ref 6.0–8.3)

## 2019-03-13 LAB — VITAMIN D 25 HYDROXY (VIT D DEFICIENCY, FRACTURES): VITD: 24.06 ng/mL — ABNORMAL LOW (ref 30.00–100.00)

## 2019-03-16 ENCOUNTER — Other Ambulatory Visit: Payer: Self-pay | Admitting: Family Medicine

## 2019-03-24 ENCOUNTER — Other Ambulatory Visit: Payer: Self-pay | Admitting: Family Medicine

## 2019-03-24 DIAGNOSIS — E559 Vitamin D deficiency, unspecified: Secondary | ICD-10-CM

## 2019-03-24 DIAGNOSIS — N1831 Chronic kidney disease, stage 3a: Secondary | ICD-10-CM

## 2019-03-24 MED ORDER — VITAMIN D (ERGOCALCIFEROL) 1.25 MG (50000 UNIT) PO CAPS: 50000.0000 [IU] | ORAL_CAPSULE | ORAL | 0 refills | Status: DC

## 2019-03-25 ENCOUNTER — Ambulatory Visit: Payer: PPO | Admitting: Podiatry

## 2019-03-25 ENCOUNTER — Encounter: Payer: Self-pay | Admitting: Podiatry

## 2019-03-25 ENCOUNTER — Ambulatory Visit: Payer: PPO

## 2019-03-25 ENCOUNTER — Other Ambulatory Visit: Payer: Self-pay

## 2019-03-25 DIAGNOSIS — M7752 Other enthesopathy of left foot: Secondary | ICD-10-CM | POA: Diagnosis not present

## 2019-03-25 NOTE — Progress Notes (Signed)
Subjective:  Patient ID: James Hood, male    DOB: 1940/10/07,  MRN: [id number] HPI Chief Complaint  Patient presents with   Ankle Pain    Patient presents today for left lateral ankle pain x years.  He reports a constant ache and sharp pains that radiate up his leg and is painful to walk.  He takes Dilaudid through pain clinic with no help   Nail Problem    he c/o bilat hallux ingrown toenails medial borders x months off and on.  He reports now they are tender to touch, but denies any drainage.  He has been using Neosporin for treatement    78 y.o. male presents with the above complaint.   ROS: He denies fever chills nausea vomiting muscle aches pains calf pain back pain chest pain shortness of breath.  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[place])    History of hiatal hernia    Hypercholesteremia    Hypertension    Hyperthyroidism    Neuropathic pain    Primary localized osteoarthritis of right knee    Stomach ulcer    Tremor, essential    Past Surgical History:  Procedure Laterality Date   BACK SURGERY     COLONOSCOPY WITH PROPOFOL N/A 09/14/2016   Procedure: COLONOSCOPY WITH PROPOFOL;  Surgeon: Manya Silvas, MD;  Location: [facility];  Service: Endoscopy;  Laterality: N/A;   esophageal stretch     JOINT REPLACEMENT Right 2016   knee   LUMBAR LAMINECTOMY/DECOMPRESSION MICRODISCECTOMY Left 02/03/2016   Procedure: LEFT L5-S1 DISKECTOMY;  Surgeon: Leeroy Cha, MD;  Location: [facility];  Service: Neurosurgery;  Laterality: Left;  LEFT L5-S1 DISKECTOMY   PULSE  GENERATOR IMPLANT N/A 08/21/2017   Procedure: UNILATERAL PULSE GENERATOR IMPLANT;  Surgeon: Meade Maw, MD;  Location: [facility];  Service: Neurosurgery;  Laterality: N/A;   PULSE GENERATOR IMPLANT Right 04/02/2018   Procedure: REMOVAL OF PULSE GENERATOR IMPLANT AND LEADS;  Surgeon: Meade Maw, MD;  Location: [facility];  Service: Neurosurgery;  Laterality: Right;   TONSILLECTOMY     TOTAL KNEE ARTHROPLASTY Right 11/15/2014   Procedure: TOTAL KNEE ARTHROPLASTY;  Surgeon: Elsie Saas, MD;  Location: [facility];  Service: Orthopedics;  Laterality: Right;    Current Outpatient Medications:    aspirin EC 81 MG tablet, Take 81 mg by mouth daily., Disp: , Rfl:    buPROPion (WELLBUTRIN SR) 150 MG 12 hr tablet, TAKE ONE TABLET BY MOUTH TWICE A DAY, Disp: 180 tablet, Rfl: 0   chlorhexidine (PERIDEX) 0.12 % solution, , Disp: , Rfl:    cyclobenzaprine (FLEXERIL) 10 MG tablet, Take 1 tablet (10 mg total) by mouth 3 (three) times daily as needed for muscle spasms. (Patient not taking: Reported on 03/09/2019), Disp: 60 tablet, Rfl: 2   diphenoxylate-atropine (LOMOTIL) 2.5-0.025 MG tablet, Take 1 tablet by mouth 2 (two) times daily. , Disp: , Rfl:    divalproex (DEPAKOTE ER) 500 MG 24 hr tablet, Take 1 tablet by mouth 2 (two) times daily., Disp: , Rfl:  donepezil (ARICEPT) 10 MG tablet, Take 10 mg by mouth at bedtime., Disp: , Rfl:    empagliflozin (JARDIANCE) 10 MG TABS tablet, Take 10 mg by mouth daily before breakfast., Disp: 30 tablet, Rfl: 3   fenofibrate (TRICOR) 145 MG tablet, TAKE ONE TABLET BY MOUTH DAILY, Disp: 90 tablet, Rfl: 1   finasteride (PROSCAR) 5 MG tablet, TAKE ONE TABLET BY MOUTH DAILY, Disp: 90 tablet, Rfl: 1   gabapentin (NEURONTIN) 600 MG tablet, TAKE TWO TABLETS BY MOUTH THREE TIMES A DAY, Disp: 180 tablet, Rfl: 0   HYDROmorphone (DILAUDID) 4 MG tablet, Take 1 tablet (4 mg total) by mouth every 8 (eight) hours as needed for severe pain., Disp: 90 tablet, Rfl:  0   lisinopril-hydrochlorothiazide (ZESTORETIC) 20-25 MG tablet, TAKE ONE TABLET BY MOUTH TWICE A DAY, Disp: 180 tablet, Rfl: 1   Melatonin 10 MG TABS, Take 20 mg by mouth at bedtime., Disp: , Rfl:    memantine (NAMENDA) 10 MG tablet, Take 10 mg by mouth 2 (two) times daily. , Disp: , Rfl:    metoprolol succinate (TOPROL-XL) 25 MG 24 hr tablet, Take 25 mg by mouth at bedtime. , Disp: , Rfl:    Multiple Vitamins-Minerals (CENTRUM SILVER PO), Take 1 tablet by mouth daily., Disp: , Rfl:    nicotine (NICODERM CQ) 14 mg/24hr patch, Place 1 patch (14 mg total) onto the skin daily. (Patient not taking: Reported on 03/09/2019), Disp: 28 patch, Rfl: 2   nicotine polacrilex (COMMIT) 4 MG lozenge, Take 1 lozenge (4 mg total) by mouth as needed for smoking cessation. (Patient not taking: Reported on 03/09/2019), Disp: 100 tablet, Rfl: 0   omeprazole (PRILOSEC) 20 MG capsule, Take 20 mg by mouth 2 (two) times daily before a meal. , Disp: , Rfl:    primidone (MYSOLINE) 50 MG tablet, Take 150-250 mg by mouth 2 (two) times daily. Take 250 mg by mouth in the morning & take 150 mg at night., Disp: , Rfl:    QUEtiapine (SEROQUEL) 25 MG tablet, Take 250 mg by mouth at bedtime. , Disp: , Rfl:    rosuvastatin (CRESTOR) 40 MG tablet, Take 1 tablet (40 mg total) by mouth daily., Disp: 90 tablet, Rfl: 1   Semaglutide,0.25 or 0.5MG /DOS, (OZEMPIC, 0.25 OR 0.5 MG/DOSE,) 2 MG/1.5ML SOPN, Inject 0.5 mg into the skin once a week., Disp: 1 pen, Rfl: 0   tamsulosin (FLOMAX) 0.4 MG CAPS capsule, TAKE ONE CAPSULE BY MOUTH DAILY AFTER SUPPER, Disp: 90 capsule, Rfl: 0   Vitamin D, Ergocalciferol, (DRISDOL) 1.25 MG (50000 UT) CAPS capsule, Take 1 capsule (50,000 Units total) by mouth every 7 (seven) days., Disp: 8 capsule, Rfl: 0  No Known Allergies Review of Systems Objective:  There were no vitals filed for this visit.  General: Well developed, nourished, in no acute distress, alert and oriented x3    Dermatological: Skin is warm, dry and supple bilateral. Nails x 10 are well maintained; remaining integument appears unremarkable at this time. There are no open sores, no preulcerative lesions, no rash or signs of infection present.  Sharp incurvated nail margins tibiofibular border the hallux no signs of infection.  Vascular: Dorsalis Pedis artery and Posterior Tibial artery pedal pulses are 2/4 bilateral with immedate capillary fill time. Pedal hair growth present. No varicosities and no lower extremity edema present bilateral.   Neruologic: Grossly intact via light touch bilateral. Vibratory intact via tuning fork bilateral. Protective threshold with Semmes Wienstein monofilament intact to all pedal sites bilateral. Patellar and  Achilles deep tendon reflexes 2+ bilateral. No Babinski or clonus noted bilateral.   Musculoskeletal: No gross boney pedal deformities bilateral. No pain, crepitus, or limitation noted with foot and ankle range of motion bilateral. Muscular strength 5/5 in all groups tested bilateral.  Pain on palpation of the fibular border/gutter of the left ankle joint.  He also has pain on vital plane range of motion of the subtalar joint.  Gait: Unassisted, Nonantalgic.    Radiographs:  Radiographs reviewed today taken on October 30 demonstrate osteoarthritic changes fibular gutter of the ankle joint.  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  Painful incurvated margins tibiofibular border of the hallux bilateral.  Plan: I injected the sinus tarsi today with 20 mg Kenalog 5 mg Marcaine point of maximal tenderness.  Also the foot debrided the margins of the nails.  Follow-up with him in about 6 weeks at which time we may need to give him an injection into the lateral gutter itself.     Niketa Turner T. [place], [place]

## 2019-03-26 ENCOUNTER — Ambulatory Visit: Payer: PPO | Admitting: Pharmacist

## 2019-03-26 DIAGNOSIS — Z720 Tobacco use: Secondary | ICD-10-CM

## 2019-03-26 DIAGNOSIS — E1142 Type 2 diabetes mellitus with diabetic polyneuropathy: Secondary | ICD-10-CM

## 2019-03-26 MED ORDER — NICOTINE POLACRILEX 4 MG MT LOZG: 4.0000 mg | LOZENGE | OROMUCOSAL | 5 refills | Status: DC | PRN

## 2019-03-26 MED ORDER — NICOTINE 14 MG/24HR TD PT24: 14.0000 mg | MEDICATED_PATCH | Freq: Every day | TRANSDERMAL | 5 refills | Status: DC

## 2019-03-26 NOTE — Patient Instructions (Signed)
Visit Information  Goals Addressed            This Visit's Progress     Patient Stated   . "I want to quit smoking" (pt-stated)       Current Barriers:  . Tobacco abuse; currently smoking 1/2 ppd . Previous quit attempts, unsuccessful. Patient reports that he has never been able to quit smoking before. Reports he is interested in patches + lozenges . Reports that he is already connected with [facility] [facility], but would prefer not to utilize this service as he does not like the constant check-ins via text  . Reports smoking within 30 minutes of waking up o Decided to start nicotine patches and lozenges. Patient noted he did not hear anything from the pharmacy on this  Pharmacist Clinical Goal(s):  Marland Kitchen Over the next 90 days, patient will work with PharmD and provider towards tobacco cessation  Interventions: . [place]. They note the patches and lozenges are NOT covered by insurance. Pharmacist ran with a GoodRx card and patches were $43.88/month and lozenges $49/month at [facility]. However, at [place], GoodRx notes a price of $24 for patches and $17 for gum.  . Discussed the above with patient; he requested I send the prescriptions to [facility].   Patient Self Care Activities:  . Patient will commit to reducing tobacco consumption  Please see past updates related to this goal by clicking on the "Past Updates" button in the selected goal      . "My blood sugars are too high" (pt-stated)       Current Barriers:  . Diabetes: uncontrolled, most recent A1c 8.5%  o Started Ozempic ~4 weeks ago, next dose will be 0.5 mg  . Current antihyperglycemic regimen: Jardiance 10 mg daily, Ozempic 0.25 mg  . Denies hypoglycemic symptoms, including dizziness, lightheadedness, shaking, sweating . Current blood glucose readings:   Fasting After Breakfast Bedtime  21-Oct 197    22-Oct 190  280  23-Oct  300   31-Oct  252   5-Nov 168 169    . Cardiovascular  risk reduction: o Antihypertensive regimen: lisinopril/HCTZ 20/25 mg daily, metoprolol succinate 25 mg daily o Antihyperlipemic regimen: rosuvastatin 40 mg daily  o Antiplatelet regimen: ASA 81 mg   Pharmacist Clinical Goal(s):  Marland Kitchen Over the next 90 days, patient will work with PharmD and primary care provider to address optimized glycemic management  Interventions: . Comprehensive medication review performed, medication list updated in electronic medical record . Patient denies any tolerability problems. Meets income criteria for [employer] assistance for [employer]; does not meet income criteria for [employer] for [place], but now that he is in the Coverage Gap, appears copay is high enough that he will qualify for Jardiance assistance through the end of the year . Discussed application process. Will collaborate w/ Dr. Caryl Bis for provider portion; patient will come by clinic to sign application and provide proof of income  Patient Self Care Activities:  . Patient will check blood glucose BID , document, and provide at future appointments . Patient will take medications as prescribed . Patient will report any questions or concerns to provider   Please see past updates related to this goal by clicking on the "Past Updates" button in the selected goal         The patient verbalized understanding of instructions provided today and declined a print copy of patient instruction materials.    Plan: - Will collaborate with patient and provider  on the above  Catie Darnelle Maffucci, PharmD, [place] Pharmacist [facility] [phone]

## 2019-03-26 NOTE — Chronic Care Management (AMB) (Signed)
Chronic Care Management   Follow Up Note   03/26/2019 Name: James Hood MRN: [id number] DOB: Jan 13, 1941  Referred by: Leone Haven, MD Reason for referral : Chronic Care Management (Medication Management)   James Hood is a 78 y.o. year old male who is a primary care patient of Caryl Bis, Angela Adam, MD. The CCM team was consulted for assistance with chronic disease management and care coordination needs.    Contacted patient for medication management review today.   Review of patient status, including review of consultants reports, relevant laboratory and other test results, and collaboration with appropriate care team members and the patient's provider was performed as part of comprehensive patient evaluation and provision of chronic care management services.    SDOH (Social Determinants of Health) screening performed today: Financial Strain  Tobacco Use. See Care Plan for related entries.   Outpatient Encounter Medications as of 03/26/2019  Medication Sig  . empagliflozin (JARDIANCE) 10 MG TABS tablet Take 10 mg by mouth daily before breakfast.  . Semaglutide,0.25 or 0.5MG /DOS, (OZEMPIC, 0.25 OR 0.5 MG/DOSE,) 2 MG/1.5ML SOPN Inject 0.5 mg into the skin once a week.  Marland Kitchen aspirin EC 81 MG tablet Take 81 mg by mouth daily.  Marland Kitchen buPROPion (WELLBUTRIN SR) 150 MG 12 hr tablet TAKE ONE TABLET BY MOUTH TWICE A DAY  . chlorhexidine (PERIDEX) 0.12 % solution   . cyclobenzaprine (FLEXERIL) 10 MG tablet Take 1 tablet (10 mg total) by mouth 3 (three) times daily as needed for muscle spasms. (Patient not taking: Reported on 03/09/2019)  . diphenoxylate-atropine (LOMOTIL) 2.5-0.025 MG tablet Take 1 tablet by mouth 2 (two) times daily.   . divalproex (DEPAKOTE ER) 500 MG 24 hr tablet Take 1 tablet by mouth 2 (two) times daily.  Marland Kitchen donepezil (ARICEPT) 10 MG tablet Take 10 mg by mouth at bedtime.  . fenofibrate (TRICOR) 145 MG tablet TAKE ONE TABLET BY MOUTH DAILY  . finasteride (PROSCAR) 5  MG tablet TAKE ONE TABLET BY MOUTH DAILY  . gabapentin (NEURONTIN) 600 MG tablet TAKE TWO TABLETS BY MOUTH THREE TIMES A DAY  . HYDROmorphone (DILAUDID) 4 MG tablet Take 1 tablet (4 mg total) by mouth every 8 (eight) hours as needed for severe pain.  Marland Kitchen lisinopril-hydrochlorothiazide (ZESTORETIC) 20-25 MG tablet TAKE ONE TABLET BY MOUTH TWICE A DAY  . Melatonin 10 MG TABS Take 20 mg by mouth at bedtime.  . memantine (NAMENDA) 10 MG tablet Take 10 mg by mouth 2 (two) times daily.   . metoprolol succinate (TOPROL-XL) 25 MG 24 hr tablet Take 25 mg by mouth at bedtime.   . Multiple Vitamins-Minerals (CENTRUM SILVER PO) Take 1 tablet by mouth daily.  . nicotine (NICODERM CQ) 14 mg/24hr patch Place 1 patch (14 mg total) onto the skin daily.  . nicotine polacrilex (COMMIT) 4 MG lozenge Take 1 lozenge (4 mg total) by mouth as needed for smoking cessation.  Marland Kitchen omeprazole (PRILOSEC) 20 MG capsule Take 20 mg by mouth 2 (two) times daily before a meal.   . primidone (MYSOLINE) 50 MG tablet Take 150-250 mg by mouth 2 (two) times daily. Take 250 mg by mouth in the morning & take 150 mg at night.  . QUEtiapine (SEROQUEL) 25 MG tablet Take 250 mg by mouth at bedtime.   . rosuvastatin (CRESTOR) 40 MG tablet Take 1 tablet (40 mg total) by mouth daily.  . tamsulosin (FLOMAX) 0.4 MG CAPS capsule TAKE ONE CAPSULE BY MOUTH DAILY AFTER SUPPER  . Vitamin D, Ergocalciferol, (  DRISDOL) 1.25 MG (50000 UT) CAPS capsule Take 1 capsule (50,000 Units total) by mouth every 7 (seven) days.  . [DISCONTINUED] nicotine (NICODERM CQ) 14 mg/24hr patch Place 1 patch (14 mg total) onto the skin daily. (Patient not taking: Reported on 03/09/2019)  . [DISCONTINUED] nicotine polacrilex (COMMIT) 4 MG lozenge Take 1 lozenge (4 mg total) by mouth as needed for smoking cessation. (Patient not taking: Reported on 03/09/2019)   No facility-administered encounter medications on file as of 03/26/2019.      Goals Addressed            This  Visit's Progress     Patient Stated   . "I want to quit smoking" (pt-stated)       Current Barriers:  . Tobacco abuse; currently smoking 1/2 ppd . Previous quit attempts, unsuccessful. Patient reports that he has never been able to quit smoking before. Reports he is interested in patches + lozenges . Reports that he is already connected with [place] [facility], but would prefer not to utilize this service as he does not like the constant check-ins via text  . Reports smoking within 30 minutes of waking up o Decided to start nicotine patches and lozenges. Patient noted he did not hear anything from the pharmacy on this  Pharmacist Clinical Goal(s):  Marland Kitchen Over the next 90 days, patient will work with PharmD and provider towards tobacco cessation  Interventions: . [place]. They note the patches and lozenges are NOT covered by insurance. Pharmacist ran with a GoodRx card and patches were $43.88/month and lozenges $49/month at [facility]. However, at [place], GoodRx notes a price of $24 for patches and $17 for gum.  . Discussed the above with patient; he [facility].   Patient Self Care Activities:  . Patient will commit to reducing tobacco consumption  Please see past updates related to this goal by clicking on the "Past Updates" button in the selected goal      . "My blood sugars are too high" (pt-stated)       Current Barriers:  . Diabetes: uncontrolled, most recent A1c 8.5%  o Started Ozempic ~4 weeks ago, next dose will be 0.5 mg  . Current antihyperglycemic regimen: Jardiance 10 mg daily, Ozempic 0.25 mg  . Denies hypoglycemic symptoms, including dizziness, lightheadedness, shaking, sweating . Current blood glucose readings:   Fasting After Breakfast Bedtime  21-Oct 197    22-Oct 190  280  23-Oct  300   31-Oct  252   5-Nov 168 169    . Cardiovascular risk reduction: o Antihypertensive regimen:  lisinopril/HCTZ 20/25 mg daily, metoprolol succinate 25 mg daily o Antihyperlipemic regimen: rosuvastatin 40 mg daily  o Antiplatelet regimen: ASA 81 mg   Pharmacist Clinical Goal(s):  Marland Kitchen Over the next 90 days, patient will work with PharmD and primary care provider to address optimized glycemic management  Interventions: . Comprehensive medication review performed, medication list updated in electronic medical record . Patient denies any tolerability problems. Meets income criteria for [employer] assistance for [employer]; does not meet income criteria for [employer] for [place], but now that he is in the Coverage Gap, appears copay is high enough that he will qualify for Jardiance assistance through the end of the year . Discussed application process. Will collaborate w/ Dr. Caryl Bis for provider portion; patient will come by clinic to sign application and provide proof of income  Patient Self Care Activities:  . Patient  will check blood glucose BID , document, and provide at future appointments . Patient will take medications as prescribed . Patient will report any questions or concerns to provider   Please see past updates related to this goal by clicking on the "Past Updates" button in the selected goal          Plan: - Will collaborate with patient and provider on the above  Catie Darnelle Maffucci, PharmD, [place] Pharmacist [place] [phone]

## 2019-03-27 ENCOUNTER — Other Ambulatory Visit: Payer: Self-pay | Admitting: Family Medicine

## 2019-03-27 DIAGNOSIS — E559 Vitamin D deficiency, unspecified: Secondary | ICD-10-CM

## 2019-03-27 NOTE — Progress Notes (Signed)
Reviewed.  Agree with plan   Dr Patrycja Mumpower 

## 2019-03-30 ENCOUNTER — Ambulatory Visit (INDEPENDENT_AMBULATORY_CARE_PROVIDER_SITE_OTHER): Payer: PPO | Admitting: Pharmacist

## 2019-03-30 DIAGNOSIS — E1142 Type 2 diabetes mellitus with diabetic polyneuropathy: Secondary | ICD-10-CM | POA: Diagnosis not present

## 2019-03-30 DIAGNOSIS — Z720 Tobacco use: Secondary | ICD-10-CM

## 2019-03-30 MED ORDER — NICOTINE 14 MG/24HR TD PT24: 14.0000 mg | MEDICATED_PATCH | Freq: Every day | TRANSDERMAL | 5 refills | Status: DC

## 2019-03-30 MED ORDER — NICOTINE POLACRILEX 4 MG MT LOZG: 4.0000 mg | LOZENGE | OROMUCOSAL | 5 refills | Status: DC | PRN

## 2019-03-30 NOTE — Chronic Care Management (AMB) (Signed)
Chronic Care Management   Follow Up Note   03/30/2019 Name: DAIGAN MUDD MRN: [id number] DOB: 08-24-1940  Referred by: Leone Haven, MD Reason for referral : Chronic Care Management (Medication Management)   SUZANNE MARSO is a 78 y.o. year old male who is a primary care patient of Caryl Bis, Angela Adam, MD. The CCM team was consulted for assistance with chronic disease management and care coordination needs.    Care coordination completed today.   Review of patient status, including review of consultants reports, relevant laboratory and other test results, and collaboration with appropriate care team members and the patient's provider was performed as part of comprehensive patient evaluation and provision of chronic care management services.    SDOH (Social Determinants of Health) screening performed today: Financial Strain  Tobacco Use. See Care Plan for related entries.   Outpatient Encounter Medications as of 03/30/2019  Medication Sig  . aspirin EC 81 MG tablet Take 81 mg by mouth daily.  Marland Kitchen buPROPion (WELLBUTRIN SR) 150 MG 12 hr tablet TAKE ONE TABLET BY MOUTH TWICE A DAY  . chlorhexidine (PERIDEX) 0.12 % solution   . cyclobenzaprine (FLEXERIL) 10 MG tablet Take 1 tablet (10 mg total) by mouth 3 (three) times daily as needed for muscle spasms. (Patient not taking: Reported on 03/09/2019)  . diphenoxylate-atropine (LOMOTIL) 2.5-0.025 MG tablet Take 1 tablet by mouth 2 (two) times daily.   . divalproex (DEPAKOTE ER) 500 MG 24 hr tablet Take 1 tablet by mouth 2 (two) times daily.  Marland Kitchen donepezil (ARICEPT) 10 MG tablet Take 10 mg by mouth at bedtime.  . empagliflozin (JARDIANCE) 10 MG TABS tablet Take 10 mg by mouth daily before breakfast.  . fenofibrate (TRICOR) 145 MG tablet TAKE ONE TABLET BY MOUTH DAILY  . finasteride (PROSCAR) 5 MG tablet TAKE ONE TABLET BY MOUTH DAILY  . gabapentin (NEURONTIN) 600 MG tablet TAKE TWO TABLETS BY MOUTH THREE TIMES A DAY  . HYDROmorphone  (DILAUDID) 4 MG tablet Take 1 tablet (4 mg total) by mouth every 8 (eight) hours as needed for severe pain.  Marland Kitchen lisinopril-hydrochlorothiazide (ZESTORETIC) 20-25 MG tablet TAKE ONE TABLET BY MOUTH TWICE A DAY  . Melatonin 10 MG TABS Take 20 mg by mouth at bedtime.  . memantine (NAMENDA) 10 MG tablet Take 10 mg by mouth 2 (two) times daily.   . metoprolol succinate (TOPROL-XL) 25 MG 24 hr tablet Take 25 mg by mouth at bedtime.   . Multiple Vitamins-Minerals (CENTRUM SILVER PO) Take 1 tablet by mouth daily.  . nicotine (NICODERM CQ) 14 mg/24hr patch Place 1 patch (14 mg total) onto the skin daily.  . nicotine polacrilex (COMMIT) 4 MG lozenge Take 1 lozenge (4 mg total) by mouth as needed for smoking cessation.  Marland Kitchen omeprazole (PRILOSEC) 20 MG capsule Take 20 mg by mouth 2 (two) times daily before a meal.   . primidone (MYSOLINE) 50 MG tablet Take 150-250 mg by mouth 2 (two) times daily. Take 250 mg by mouth in the morning & take 150 mg at night.  . QUEtiapine (SEROQUEL) 25 MG tablet Take 250 mg by mouth at bedtime.   . rosuvastatin (CRESTOR) 40 MG tablet Take 1 tablet (40 mg total) by mouth daily.  . Semaglutide,0.25 or 0.5MG /DOS, (OZEMPIC, 0.25 OR 0.5 MG/DOSE,) 2 MG/1.5ML SOPN Inject 0.5 mg into the skin once a week.  . tamsulosin (FLOMAX) 0.4 MG CAPS capsule TAKE ONE CAPSULE BY MOUTH DAILY AFTER SUPPER  . Vitamin D, Ergocalciferol, (DRISDOL) 1.25 MG (  50000 UT) CAPS capsule Take 1 capsule (50,000 Units total) by mouth every 7 (seven) days.  . [DISCONTINUED] nicotine (NICODERM CQ) 14 mg/24hr patch Place 1 patch (14 mg total) onto the skin daily.  . [DISCONTINUED] nicotine polacrilex (COMMIT) 4 MG lozenge Take 1 lozenge (4 mg total) by mouth as needed for smoking cessation.   No facility-administered encounter medications on file as of 03/30/2019.      Goals Addressed            This Visit's Progress     Patient Stated   . "I want to quit smoking" (pt-stated)       Current Barriers:  .  Tobacco abuse; currently smoking 1/2 ppd . Previous quit attempts, unsuccessful. Patient reports that he has never been able to quit smoking before. Reports he is interested in patches + lozenges . Reports that he is already connected with [facility] [facility], but would prefer not to utilize this service as he does not like the constant check-ins via text  . Reports smoking within 30 minutes of waking up o Patient notes today that the GoodRx price listed on the website did not match with the price he was quoted at [place]):  Marland Kitchen Over the next 90 days, patient will work with PharmD and provider towards tobacco cessation  Interventions: . Reviewed GoodRx. Price may be more affordable at [employer]. With patient's permission, resent the prescriptions to [facility]. Will follow up with him to determine if affordable  Patient Self Care Activities:  . Patient will commit to reducing tobacco consumption  Please see past updates related to this goal by clicking on the "Past Updates" button in the selected goal      . "My blood sugars are too high" (pt-stated)       Current Barriers:  . Diabetes: uncontrolled, most recent A1c 8.5%  o Started Ozempic ~4 weeks ago, next dose will be 0.5 mg  . Current antihyperglycemic regimen: Jardiance 10 mg daily, Ozempic 0.25 mg  . Denies hypoglycemic symptoms, including dizziness, lightheadedness, shaking, sweating . Current blood glucose readings:   Fasting After Breakfast Bedtime  21-Oct 197    22-Oct 190  280  23-Oct  300   31-Oct  252   5-Nov 168 169    . Cardiovascular risk reduction: o Antihypertensive regimen: lisinopril/HCTZ 20/25 mg daily, metoprolol succinate 25 mg daily o Antihyperlipemic regimen: rosuvastatin 40 mg daily  o Antiplatelet regimen: ASA 81 mg   Pharmacist Clinical Goal(s):  Marland Kitchen Over the next 90 days, patient will work with PharmD and primary care provider to address optimized glycemic management  Interventions:  . Comprehensive medication review performed, medication list updated in electronic medical record . Patient qualifies for [employer] assistance through [employer], and the appeals process for Jardiance through [employer] d/t his copayment. Received patient and provider portions of the applications as well as patient income information. Faxed applications to the respective companies. Will pass materials along to [employer], CPhT for follow up  Patient Self Care Activities:  . Patient will check blood glucose BID , document, and provide at future appointments . Patient will take medications as prescribed . Patient will report any questions or concerns to provider   Please see past updates related to this goal by clicking on the "Past Updates" button in the selected goal         Plan:  - Will collaborate with Susy Frizzle, CPhT as above  Catie Darnelle Maffucci, PharmD, CPP Clinical  Pharmacist [place] [phone]

## 2019-03-30 NOTE — Patient Instructions (Signed)
Visit Information  Goals Addressed            This Visit's Progress     Patient Stated   . "I want to quit smoking" (pt-stated)       Current Barriers:  . Tobacco abuse; currently smoking 1/2 ppd . Previous quit attempts, unsuccessful. Patient reports that he has never been able to quit smoking before. Reports he is interested in patches + lozenges . Reports that he is already connected with [place] [facility], but would prefer not to utilize this service as he does not like the constant check-ins via text  . Reports smoking within 30 minutes of waking up o Patient notes today that the GoodRx price listed on the website did not match with the price he was quoted at [place]):  Marland Kitchen Over the next 90 days, patient will work with PharmD and provider towards tobacco cessation  Interventions: . Reviewed GoodRx. Price may be more affordable at [employer]. With patient's permission, resent the prescriptions to [facility]. Will follow up with him to determine if affordable  Patient Self Care Activities:  . Patient will commit to reducing tobacco consumption  Please see past updates related to this goal by clicking on the "Past Updates" button in the selected goal      . "My blood sugars are too high" (pt-stated)       Current Barriers:  . Diabetes: uncontrolled, most recent A1c 8.5%  o Started Ozempic ~4 weeks ago, next dose will be 0.5 mg  . Current antihyperglycemic regimen: Jardiance 10 mg daily, Ozempic 0.25 mg  . Denies hypoglycemic symptoms, including dizziness, lightheadedness, shaking, sweating . Current blood glucose readings:   Fasting After Breakfast Bedtime  21-Oct 197    22-Oct 190  280  23-Oct  300   31-Oct  252   5-Nov 168 169    . Cardiovascular risk reduction: o Antihypertensive regimen: lisinopril/HCTZ 20/25 mg daily, metoprolol succinate 25 mg daily o Antihyperlipemic regimen: rosuvastatin 40 mg daily  o Antiplatelet regimen: ASA 81 mg    Pharmacist Clinical Goal(s):  Marland Kitchen Over the next 90 days, patient will work with PharmD and primary care provider to address optimized glycemic management  Interventions: . Comprehensive medication review performed, medication list updated in electronic medical record . Patient qualifies for [employer] assistance through [employer], and the appeals process for Jardiance through [employer] d/t his copayment. Received patient and provider portions of the applications as well as patient income information. Faxed applications to the respective companies. Will pass materials along to [employer], CPhT for follow up  Patient Self Care Activities:  . Patient will check blood glucose BID , document, and provide at future appointments . Patient will take medications as prescribed . Patient will report any questions or concerns to provider   Please see past updates related to this goal by clicking on the "Past Updates" button in the selected goal         The patient verbalized understanding of instructions provided today and declined a print copy of patient instruction materials.   Plan:  - Will collaborate with Susy Frizzle, CPhT as above  Catie Darnelle Maffucci, PharmD, [place] Pharmacist [place] [phone]

## 2019-04-01 ENCOUNTER — Ambulatory Visit
Admission: RE | Admit: 2019-04-01 | Discharge: 2019-04-01 | Disposition: A | Discharge status: Home / Self Care | Payer: PPO | Source: Ambulatory Visit | Attending: Family Medicine | Admitting: Family Medicine

## 2019-04-01 ENCOUNTER — Other Ambulatory Visit: Payer: Self-pay

## 2019-04-01 DIAGNOSIS — N1831 Chronic kidney disease, stage 3a: Secondary | ICD-10-CM

## 2019-04-01 DIAGNOSIS — N183 Chronic kidney disease, stage 3 unspecified: Secondary | ICD-10-CM | POA: Diagnosis not present

## 2019-04-02 ENCOUNTER — Ambulatory Visit: Payer: Self-pay | Admitting: Pharmacist

## 2019-04-02 ENCOUNTER — Other Ambulatory Visit: Payer: Self-pay

## 2019-04-02 ENCOUNTER — Other Ambulatory Visit: Payer: Self-pay | Admitting: Pharmacy Technician

## 2019-04-02 NOTE — Progress Notes (Signed)
Reviewed.  Agree with plan   Dr Brenin Heidelberger 

## 2019-04-02 NOTE — Chronic Care Management (AMB) (Signed)
  Chronic Care Management   Note  04/02/2019 Name: James Hood MRN: [id number] DOB: 1940/08/23  [name] is a 78 y.o. year old male who is a primary care patient of Caryl Bis, Angela Adam, MD. The CCM team was consulted for assistance with chronic disease management and care coordination needs.    Received message from [facility] [facility], CPhT that there was error w/ Dr. Ellen Henri license on [place] application. Corrected this and refaxed application to [employer].   Catie Darnelle Maffucci, PharmD, [place] Pharmacist [facility] [place] [phone]

## 2019-04-02 NOTE — Patient Outreach (Signed)
[facility]) Care Management  04/02/2019  RC SCHIRALDI 01/27/1941 [id number]  Care coordination call placed to [place] in regards to patient's application for Ozempic.  Spoke to [place] who informed the license number for the provider written on the application is coming back as unverifiable. The license number I provided to Charmaine does not match what is on the application per Charmaine. Charmaine informed to resubmit application with correct license information so that the processing can continue.  Corrected and refaxed information into [employer].  Will followup with [employer] in 3-7 business days.  Joye Wesenberg P. Merel Santoli, [place] Management [phone]

## 2019-04-03 ENCOUNTER — Other Ambulatory Visit: Payer: Self-pay | Admitting: Pharmacy Technician

## 2019-04-03 ENCOUNTER — Other Ambulatory Visit: Payer: Self-pay | Admitting: *Deleted

## 2019-04-03 NOTE — Patient Outreach (Signed)
[facility]) Care Management  04/03/2019  James Hood 12/06/1940 [id number]   Care coordination call placed to [name] in regards to patient's application for Ozempic.  Spoke to [name] who informed patient has been APPROVED 04/03/2019-05/21/2019. She informed patient would receive a 90 days supply of medication. Patient will need to re apply on or after 1/1/20201.  Will followup with patient in 10-14 business days to inquire if medication was received.  Jaleeyah Munce P. Jarome Trull, [place] Management [phone]

## 2019-04-06 ENCOUNTER — Other Ambulatory Visit: Payer: PPO

## 2019-04-06 ENCOUNTER — Other Ambulatory Visit: Payer: Self-pay | Admitting: Pharmacy Technician

## 2019-04-06 NOTE — Patient Outreach (Signed)
[facility]) Care Management  04/06/2019  James Hood 1941-05-08 [id number]   Care coordination call placed to [place] in regards to patient's application for Jardiance.  Spoke to [name] who informed patient has been DENIED. [name] informed patient was over income for the program based on his income for a house hold of 2 members. She also informed the appeal has been denied as patient has not and will not meet the requirement of 3% of his income being spent on the Jardiance.  An inbasket message has been sent to embedded [place].  Will followup with patient in 10-14 business days to inquire if he received the Ozempic that he was approved for through [employer] patient assistance program.  [name]. [name], [place] Management [phone]

## 2019-04-07 ENCOUNTER — Other Ambulatory Visit (INDEPENDENT_AMBULATORY_CARE_PROVIDER_SITE_OTHER): Payer: PPO

## 2019-04-07 ENCOUNTER — Other Ambulatory Visit: Payer: Self-pay

## 2019-04-07 DIAGNOSIS — E559 Vitamin D deficiency, unspecified: Secondary | ICD-10-CM | POA: Diagnosis not present

## 2019-04-07 LAB — VITAMIN D 25 HYDROXY (VIT D DEFICIENCY, FRACTURES): VITD: 42.83 ng/mL (ref 30.00–100.00)

## 2019-04-08 ENCOUNTER — Telehealth: Payer: Self-pay

## 2019-04-08 NOTE — Telephone Encounter (Signed)
Approval for the [employer] Program was Approved through April 20, 2019.  4 month supply will be shipped to the office.  Verdia Bolt,cma

## 2019-04-09 ENCOUNTER — Ambulatory Visit: Payer: Self-pay | Admitting: Pharmacist

## 2019-04-09 DIAGNOSIS — Z720 Tobacco use: Secondary | ICD-10-CM

## 2019-04-09 DIAGNOSIS — E1142 Type 2 diabetes mellitus with diabetic polyneuropathy: Secondary | ICD-10-CM

## 2019-04-09 NOTE — Progress Notes (Signed)
Reviewed information.  Agree with assessment and plan.    Dr Ankith Edmonston 

## 2019-04-09 NOTE — Chronic Care Management (AMB) (Signed)
Chronic Care Management   Follow Up Note   04/09/2019 Name: James Hood MRN: [id number] DOB: 11-16-1940  Referred by: James Haven, MD Reason for referral : Chronic Care Management (Medication Management )   James Hood is a 78 y.o. year old male who is a primary care patient of James Hood, James Adam, MD. The CCM team was consulted for assistance with chronic disease management and care coordination needs.    Received call from patient about mail he received from patient assistance programs.   Review of patient status, including review of consultants reports, relevant laboratory and other test results, and collaboration with appropriate care team members and the patient's provider was performed as part of comprehensive patient evaluation and provision of chronic care management services.    SDOH (Social Determinants of Health) screening performed today: Financial Strain  Tobacco Use. See Care Plan for related entries.   Outpatient Encounter Medications as of 04/09/2019  Medication Sig  . aspirin EC 81 MG tablet Take 81 mg by mouth daily.  Marland Kitchen buPROPion (WELLBUTRIN SR) 150 MG 12 hr tablet TAKE ONE TABLET BY MOUTH TWICE A DAY  . chlorhexidine (PERIDEX) 0.12 % solution   . cyclobenzaprine (FLEXERIL) 10 MG tablet Take 1 tablet (10 mg total) by mouth 3 (three) times daily as needed for muscle spasms. (Patient not taking: Reported on 03/09/2019)  . diphenoxylate-atropine (LOMOTIL) 2.5-0.025 MG tablet Take 1 tablet by mouth 2 (two) times daily.   . divalproex (DEPAKOTE ER) 500 MG 24 hr tablet Take 1 tablet by mouth 2 (two) times daily.  Marland Kitchen donepezil (ARICEPT) 10 MG tablet Take 10 mg by mouth at bedtime.  . empagliflozin (JARDIANCE) 10 MG TABS tablet Take 10 mg by mouth daily before breakfast.  . fenofibrate (TRICOR) 145 MG tablet TAKE ONE TABLET BY MOUTH DAILY  . finasteride (PROSCAR) 5 MG tablet TAKE ONE TABLET BY MOUTH DAILY  . gabapentin (NEURONTIN) 600 MG tablet TAKE TWO  TABLETS BY MOUTH THREE TIMES A DAY  . HYDROmorphone (DILAUDID) 4 MG tablet Take 1 tablet (4 mg total) by mouth every 8 (eight) hours as needed for severe pain.  Marland Kitchen lisinopril-hydrochlorothiazide (ZESTORETIC) 20-25 MG tablet TAKE ONE TABLET BY MOUTH TWICE A DAY  . Melatonin 10 MG TABS Take 20 mg by mouth at bedtime.  . memantine (NAMENDA) 10 MG tablet Take 10 mg by mouth 2 (two) times daily.   . metoprolol succinate (TOPROL-XL) 25 MG 24 hr tablet Take 25 mg by mouth at bedtime.   . Multiple Vitamins-Minerals (CENTRUM SILVER PO) Take 1 tablet by mouth daily.  . nicotine (NICODERM CQ) 14 mg/24hr patch Place 1 patch (14 mg total) onto the skin daily.  . nicotine polacrilex (COMMIT) 4 MG lozenge Take 1 lozenge (4 mg total) by mouth as needed for smoking cessation.  Marland Kitchen omeprazole (PRILOSEC) 20 MG capsule Take 20 mg by mouth 2 (two) times daily before a meal.   . primidone (MYSOLINE) 50 MG tablet Take 150-250 mg by mouth 2 (two) times daily. Take 250 mg by mouth in the morning & take 150 mg at night.  . QUEtiapine (SEROQUEL) 25 MG tablet Take 250 mg by mouth at bedtime.   . rosuvastatin (CRESTOR) 40 MG tablet Take 1 tablet (40 mg total) by mouth daily.  . Semaglutide,0.25 or 0.5MG /DOS, (OZEMPIC, 0.25 OR 0.5 MG/DOSE,) 2 MG/1.5ML SOPN Inject 0.5 mg into the skin once a week.  . tamsulosin (FLOMAX) 0.4 MG CAPS capsule TAKE ONE CAPSULE BY MOUTH DAILY AFTER  SUPPER  . Vitamin D, Ergocalciferol, (DRISDOL) 1.25 MG (50000 UT) CAPS capsule Take 1 capsule (50,000 Units total) by mouth every 7 (seven) days.   No facility-administered encounter medications on file as of 04/09/2019.      Goals Addressed            This Visit's Progress     Patient Stated   . "I want to quit smoking" (pt-stated)       Current Barriers:  . Tobacco abuse; currently smoking 1/2 ppd . Previous quit attempts, unsuccessful. Patient reports that he has never been able to quit smoking before. Reports he is interested in patches +  lozenges . Reports that he is already connected with [place] [facility], but would prefer not to utilize this service as he does not like the constant check-ins via text  . Reports smoking within 30 minutes of waking up o Sent prescription for patches and lozenges to [facility].  Pharmacist Clinical Goal(s):  Marland Kitchen Over the next 90 days, patient will work with PharmD and provider towards tobacco cessation  Interventions: . Patient notes he has not had a chance to go by [facility] yet to see the price w/ Good Rx. He will update me when he has done so.  Patient Self Care Activities:  . Patient will commit to reducing tobacco consumption  Please see past updates related to this goal by clicking on the "Past Updates" button in the selected goal      . "My blood sugars are too high" (pt-stated)       Current Barriers:  . Diabetes: uncontrolled, most recent A1c 8.5% o Patient has been APPROVED for Ozempic assistance. Reviewed this with him o Was denied for Jardiance assistance d/t being over income . Current antihyperglycemic regimen: Jardiance 10 mg daily, Ozempic 0.5 mg (x1 dose, 2nd dose today)  . Denies hypoglycemic symptoms, including dizziness, lightheadedness, shaking, sweating . Current blood glucose readings: o Reports reading this morning was ~130s . Cardiovascular risk reduction: o Antihypertensive regimen: lisinopril/HCTZ 20/25 mg daily, metoprolol succinate 25 mg daily o Antihyperlipemic regimen: rosuvastatin 40 mg daily  o Antiplatelet regimen: ASA 81 mg   Pharmacist Clinical Goal(s):  Marland Kitchen Over the next 90 days, patient will work with PharmD and primary care provider to address optimized glycemic management  Interventions: . Informed patient that a 90 day supply of Ozempic will ship to the office, and we will contact him when available for pick up.  . Reviewed that sugar readings will continue to improve as the higher dose of Ozempic reaches steady state . Reviewed that we will reapply for  Ozempic assistance from [employer] in the beginning of January  Patient Self Care Activities:  . Patient will check blood glucose BID , document, and provide at future appointments . Patient will take medications as prescribed . Patient will report any questions or concerns to provider   Please see past updates related to this goal by clicking on the "Past Updates" button in the selected goal          Plan: - Will await call back from patient regarding nicotine replacement therapy cost - Will follow up on blood sugars and medication management in ~6 weeks  Catie Darnelle Maffucci, PharmD, [place] Pharmacist [place] [phone]

## 2019-04-09 NOTE — Patient Instructions (Signed)
Visit Information  Goals Addressed            This Visit's Progress     Patient Stated   . "I want to quit smoking" (pt-stated)       Current Barriers:  . Tobacco abuse; currently smoking 1/2 ppd . Previous quit attempts, unsuccessful. Patient reports that he has never been able to quit smoking before. Reports he is interested in patches + lozenges . Reports that he is already connected with [place] [facility], but would prefer not to utilize this service as he does not like the constant check-ins via text  . Reports smoking within 30 minutes of waking up o Sent prescription for patches and lozenges to [facility].  Pharmacist Clinical Goal(s):  Marland Kitchen Over the next 90 days, patient will work with PharmD and provider towards tobacco cessation  Interventions: . Patient notes he has not had a chance to go by [facility] yet to see the price w/ Good Rx. He will update me when he has done so.  Patient Self Care Activities:  . Patient will commit to reducing tobacco consumption  Please see past updates related to this goal by clicking on the "Past Updates" button in the selected goal      . "My blood sugars are too high" (pt-stated)       Current Barriers:  . Diabetes: uncontrolled, most recent A1c 8.5% o Patient has been APPROVED for Ozempic assistance. Reviewed this with him o Was denied for Jardiance assistance d/t being over income . Current antihyperglycemic regimen: Jardiance 10 mg daily, Ozempic 0.5 mg (x1 dose, 2nd dose today)  . Denies hypoglycemic symptoms, including dizziness, lightheadedness, shaking, sweating . Current blood glucose readings: o Reports reading this morning was ~130s . Cardiovascular risk reduction: o Antihypertensive regimen: lisinopril/HCTZ 20/25 mg daily, metoprolol succinate 25 mg daily o Antihyperlipemic regimen: rosuvastatin 40 mg daily  o Antiplatelet regimen: ASA 81 mg   Pharmacist Clinical Goal(s):  Marland Kitchen Over the next 90 days, patient will work with PharmD and  primary care provider to address optimized glycemic management  Interventions: . Informed patient that a 90 day supply of Ozempic will ship to the office, and we will contact him when available for pick up.  . Reviewed that sugar readings will continue to improve as the higher dose of Ozempic reaches steady state . Reviewed that we will reapply for Ozempic assistance from [employer] in the beginning of January  Patient Self Care Activities:  . Patient will check blood glucose BID , document, and provide at future appointments . Patient will take medications as prescribed . Patient will report any questions or concerns to provider   Please see past updates related to this goal by clicking on the "Past Updates" button in the selected goal         The patient verbalized understanding of instructions provided today and declined a print copy of patient instruction materials.    Plan: - Will await call back from patient regarding nicotine replacement therapy cost - Will follow up on blood sugars and medication management in ~6 weeks  Catie Darnelle Maffucci, PharmD, [place] Pharmacist [place] [phone]

## 2019-04-10 ENCOUNTER — Telehealth: Payer: Self-pay

## 2019-04-10 NOTE — Telephone Encounter (Signed)
Ozempic medication came in today 5 bozes for this patient.  Nina,cma

## 2019-04-10 NOTE — Telephone Encounter (Signed)
I called and informed the patient that his medication Ozempic has arrived here at the office and is ready for pickup. Pt understood.  Kyiesha Millward,cma

## 2019-04-11 ENCOUNTER — Other Ambulatory Visit: Payer: Self-pay | Admitting: Family Medicine

## 2019-04-14 ENCOUNTER — Other Ambulatory Visit: Payer: Self-pay

## 2019-04-14 ENCOUNTER — Ambulatory Visit
Payer: PPO | Attending: Student in an Organized Health Care Education/Training Program | Admitting: Student in an Organized Health Care Education/Training Program

## 2019-04-14 ENCOUNTER — Encounter: Payer: Self-pay | Admitting: Student in an Organized Health Care Education/Training Program

## 2019-04-14 DIAGNOSIS — M19072 Primary osteoarthritis, left ankle and foot: Secondary | ICD-10-CM | POA: Diagnosis not present

## 2019-04-14 DIAGNOSIS — T85192S Other mechanical complication of implanted electronic neurostimulator (electrode) of spinal cord, sequela: Secondary | ICD-10-CM | POA: Diagnosis not present

## 2019-04-14 DIAGNOSIS — M5386 Other specified dorsopathies, lumbar region: Secondary | ICD-10-CM | POA: Diagnosis not present

## 2019-04-14 DIAGNOSIS — G894 Chronic pain syndrome: Secondary | ICD-10-CM | POA: Diagnosis not present

## 2019-04-14 DIAGNOSIS — M961 Postlaminectomy syndrome, not elsewhere classified: Secondary | ICD-10-CM

## 2019-04-14 DIAGNOSIS — M5417 Radiculopathy, lumbosacral region: Secondary | ICD-10-CM | POA: Diagnosis not present

## 2019-04-14 DIAGNOSIS — F1190 Opioid use, unspecified, uncomplicated: Secondary | ICD-10-CM

## 2019-04-14 DIAGNOSIS — M47816 Spondylosis without myelopathy or radiculopathy, lumbar region: Secondary | ICD-10-CM

## 2019-04-14 MED ORDER — OXYCODONE-ACETAMINOPHEN 10-325 MG PO TABS: 1.0000 | ORAL_TABLET | Freq: Four times a day (QID) | ORAL | 0 refills | Status: DC | PRN

## 2019-04-14 NOTE — Progress Notes (Signed)
Pain Management Virtual Encounter Note - Virtual Visit via [place] (real-time audio visits between healthcare provider and patient).   Patient's Phone No. & Preferred Pharmacy:  [phone] (home); [phone] (mobile); (Preferred) [phone] [email or web address]  [place], [place] [address] Phone: [phone] Fax: [phone]  [facility] [name], [place] - [address] [place] [address] Phone: [phone] Fax: [phone]  [place] [address], [place] - [place] [address] Phone: [phone] Fax: [phone]    Pre-screening note:  Our staff contacted [name] and offered him an "in person", "face-to-face" appointment versus a telephone encounter. He indicated preferring the telephone encounter, at this time.   Reason for Virtual Visit: COVID-19*  Social distancing based on CDC and AMA recommendations.   I contacted James Hood on 04/14/2019 via video conference.      I clearly identified myself as [name], MD. I verified that I was speaking with the correct person using two identifiers (Name: James Hood, and date of birth: [birth year]).  Advanced Informed Consent I sought verbal advanced consent from James Hood for virtual visit interactions. I informed Mr. Duchesneau of possible security and privacy concerns, risks, and limitations associated with providing "not-in-person" medical evaluation and management services. I also informed Mr. Farwell of the availability of "in-person" appointments. Finally, I informed him that there would be a charge for the virtual visit and that he could be  personally, fully or partially, financially responsible for it. Mr. Rister expressed understanding and agreed to proceed.    Historic Elements   [name] is a 78 y.o. year old, male patient evaluated today after his last encounter by our practice on 03/10/2019. Mr. 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 [name] has a current medication list which includes the following prescription(s): aspirin ec, bupropion, chlorhexidine, diphenoxylate-atropine, divalproex, donepezil, empagliflozin, fenofibrate, finasteride, gabapentin, lisinopril-hydrochlorothiazide, melatonin, memantine, metoprolol succinate, multiple vitamins-minerals, nicotine, nicotine polacrilex, omeprazole, oxycodone-acetaminophen, oxycodone-acetaminophen, oxycodone-acetaminophen, primidone, quetiapine, rosuvastatin, ozempic (0.25 or 0.5 mg/dose), tamsulosin, and vitamin d (ergocalciferol). He  reports that he has been smoking cigarettes. He has a 30.00 pack-year smoking history. He has never used smokeless tobacco. He reports previous alcohol use. He reports that he does not use drugs. [name] has No Known Allergies.   HPI  Today, he is being contacted for medication management.   Patient  trialed hydromorphone 4 mg 3 times daily as needed.  He did not find any improved analgesic benefit with this compared to his previous Percocet regimen which was 10 mg every 4-6 hours as needed, quantity 100/month.  Patient states that he would like to go back to Percocet as before.  This is reasonable.  Pharmacotherapy Assessment  Analgesic:  03/19/2019  1   03/10/2019  Hydromorphone 4 MG Tablet  90.00  30 Bi Lat   [id number]   Har ([id number])   0  48.00 MME  Medicare   [place]     Monitoring: Pharmacotherapy: No side-effects or adverse reactions reported. [place] PMP: PDMP reviewed during this encounter.       Compliance: No problems identified. Effectiveness: Clinically acceptable. Plan: Refer to "POC". Go back to Percocet 10 mg q4-6 hrs PRN, #100/month  UDS:  Summary  Date Value Ref Range Status  04/15/2018 FINAL  Final    Comment:    ==================================================================== TOXASSURE SELECT 13 (MW) ==================================================================== Test                             Result       Flag       Units Drug Present and Declared for Prescription Verification   Oxycodone                      133          EXPECTED   ng/mg creat   Oxymorphone                    70           EXPECTED   ng/mg creat   Noroxycodone                   1417         EXPECTED   ng/mg creat   Noroxymorphone                 83           EXPECTED   ng/mg creat    Sources of oxycodone are scheduled prescription medications.    Oxymorphone, noroxycodone, and noroxymorphone are expected    metabolites of oxycodone. Oxymorphone is also available as a    scheduled prescription medication.   Phenobarbital                  PRESENT      EXPECTED    Phenobarbital is an expected metabolite of primidone;    Phenobarbital may also be administered as a prescription drug. ==================================================================== Test                      Result    Flag    Units      Ref Range   Creatinine              76               mg/dL      >=20 ==================================================================== Declared Medications:  The flagging and interpretation on this report are based on the  following declared medications.  Unexpected results may arise from  inaccuracies in the declared medications.  **Note: The testing scope of this panel includes these medications:  Oxycodone (Oxycodone Acetaminophen)  Primidone (Mysoline)  **Note: The testing scope of this panel does not include following  reported medications:  Acetaminophen (Oxycodone Acetaminophen)  Aspirin (Aspirin 81)  Atropine (Lomotil)  Bupropion  Cefepime  Cyclobenzaprine  Diphenoxylate (Lomotil)  Divalproex (Depakote)  Donepezil (Aricept)  Fenofibrate (Tricor)  Finasteride  Gabapentin  Hydrochlorothiazide (Prinzide)  Lisinopril (Prinzide)  Melatonin  Memantine  Metformin  Metoprolol  Multivitamin  Omeprazole  Quetiapine  Simvastatin  Sitagliptin (Januvia)  Tamsulosin ====================================================================  For clinical consultation, please call [phone]. ====================================================================    Laboratory Chemistry Profile (12 mo)  Renal: 03/13/2019: BUN 32; Creatinine, Ser 1.54  Lab Results  Component Value Date   GFR 43.78 (L) 03/13/2019   GFRAA >60 08/15/2017   GFRNONAA >60 08/15/2017   Hepatic: 03/13/2019: Albumin 4.2 Lab Results  Component Value Date   AST 24 03/13/2019   ALT 17 03/13/2019   Other: 02/02/2019: Vitamin B-12 341 04/07/2019: VITD 42.83 Note: Above Lab results reviewed.  Imaging  Last 90 days:  Dg Ankle Complete Left  Result Date: 02/27/2019 CLINICAL DATA:  Chronic severe left ankle pain inferior and anterior to the lateral malleolus EXAM: LEFT ANKLE COMPLETE - 3+ VIEW COMPARISON:  None. FINDINGS: No evidence of acute fracture, traumatic malalignment or joint  effusion. Small bony spurring noted along the medial tip of the lateral malleolus may reflect chronic enthesopathic change or remote posttraumatic change. Additional degenerative spurring is noted at the tibiotalar joint and along the medial malleolus. Inferior calcaneal spur is present. Midfoot and hindfoot alignment is grossly preserved though incompletely assessed on nondedicated, nonweightbearing films. IMPRESSION: 1. No acute osseous abnormality. 2. Small bony spurring along the mesial tip of the lateral malleolus may reflect chronic enthesopathic change or remote posttraumatic change. Electronically Signed   By: Lovena Le M.D.   On: 02/27/2019 20:06   US Renal  Result Date: 04/01/2019 CLINICAL DATA:  Chronic kidney disease stage 3. EXAM: RENAL / URINARY TRACT ULTRASOUND COMPLETE COMPARISON:  None. FINDINGS: Right Kidney: Renal measurements: 12.2 x 5.7 x 5.8 cm = volume: 210 mL. Parenchymal echogenicity is increased. No mass or hydronephrosis. Left Kidney: Renal measurements: 11.1 x 5.7 x 3.9 cm = volume: 127 mL. Parenchymal echogenicity is increased. No mass or hydronephrosis. Bladder: Appears normal for degree of bladder distention. Other: None. IMPRESSION: Increased parenchymal echogenicity is in keeping with chronic medical renal disease. No acute findings. Electronically Signed   By: Lorin Picket M.D.   On: 04/01/2019 15:58   US Thyroid  Result Date: 02/24/2019 CLINICAL DATA:  Follow-up status post benign biopsy EXAM: THYROID ULTRASOUND TECHNIQUE: Ultrasound examination of the thyroid gland and adjacent soft tissues was performed. COMPARISON:  FNA dated 03/04/2018 and ultrasound dated 02/04/2018. FINDINGS: Parenchymal Echotexture: Mildly heterogenous Isthmus: 0.5 Right lobe: 4.9 x 2.2 x 2 cm Left lobe: 3.6 x 1.7 x 1.5 cm _________________________________________________________ Estimated total number of nodules >/= 1 cm: 1 Number of spongiform nodules >/=  2 cm not described below (TR1): 0  Number of mixed cystic and solid nodules >/= 1.5 cm not described below (TR2): 2 _________________________________________________________ Nodule # 1: Prior biopsy: Yes Location: Right; Inferior Maximum size: 2.5 cm; Other 2 dimensions: 2 x 1.5 cm, previously, 2.6 x 2.2 x 1.6 cm Composition: solid/almost completely solid (2) Echogenicity: isoechoic (1) Shape: not taller-than-wide (0) Margins: smooth (0) Echogenic foci: none (0) ACR TI-RADS total points: 3. ACR TI-RADS risk category:  TR3 (3 points). Significant change in size (>/= 20% in two dimensions and minimal increase of 2 mm): No Change in features: No Change in ACR TI-RADS risk category: No ACR TI-RADS recommendations: **Given size (>/= 2.5 cm) and appearance, fine needle aspiration of this mildly suspicious nodule should be considered based on TI-RADS criteria. _________________________________________________________ There are additional small subcentimeter cystic nodules in the left lobe which are stable from prior study. 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 This was previously biopsied and reported as negative.  Continued 1 year follow-up is recommended. The above is in keeping with the ACR TI-RADS recommendations - J Am Coll Radiol 2017;14:587-595. Electronically Signed   By: Constance Holster M.D.   On: 02/24/2019 09:41    Assessment  The primary encounter diagnosis was Failed back surgical syndrome. 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  Plan of Care  I have discontinued Ansel L. Hubka "Bob"'s cyclobenzaprine and HYDROmorphone. I am also having him start on  oxyCODONE-acetaminophen, oxyCODONE-acetaminophen, and oxyCODONE-acetaminophen. Additionally, I am having him maintain his donepezil, metoprolol succinate, omeprazole, memantine, diphenoxylate-atropine, primidone, QUEtiapine, Melatonin, divalproex, Multiple Vitamins-Minerals (CENTRUM SILVER PO), aspirin EC, finasteride, rosuvastatin, fenofibrate, lisinopril-hydrochlorothiazide, tamsulosin, buPROPion, empagliflozin, Ozempic (0.25 or 0.5 MG/DOSE), Vitamin D (Ergocalciferol), chlorhexidine, nicotine, nicotine polacrilex, and gabapentin.  Pharmacotherapy (Medications Ordered): Meds ordered this encounter  Medications  . oxyCODONE-acetaminophen (PERCOCET) 10-325 MG tablet    Sig: Take 1 tablet by mouth every 6 (six) hours as needed for pain. Must last 30 days. Max #100/month    Dispense:  100 tablet    Refill:  0    Chronic Pain. (STOP Act - Not applicable). Fill one day early if closed on scheduled refill date.  Marland Kitchen oxyCODONE-acetaminophen (PERCOCET) 10-325 MG tablet    Sig: Take 1 tablet by mouth every 6 (six) hours as needed for pain. Must last 30 days. Max #100/month    Dispense:  100 tablet    Refill:  0    Chronic Pain. (STOP Act - Not applicable). Fill one day early if closed on scheduled refill date.  Marland Kitchen oxyCODONE-acetaminophen (PERCOCET) 10-325 MG tablet    Sig: Take 1 tablet by mouth every 6 (six) hours as needed for pain. Must last 30 days. Max #100/month    Dispense:  100 tablet    Refill:  0    Chronic Pain. (STOP Act - Not applicable). Fill one day early if closed on scheduled refill date.   Orders:  Orders Placed This Encounter  Procedures  . UDS (Compliance-13) ([facility]) ([facility]) (Established Pt.)    Volume: 30 ml(s). Minimum 3 ml of urine is needed. Document temperature of fresh sample. Indications: Long term (current) use of opiate analgesic [id number])   Follow-up plan:   Return in about 3 months (around 07/11/2019) for Medication Management.    Recent Visits Date Type  Provider Dept  03/10/19 Office Visit [name], MD [facility]  Showing recent visits within past 90 days and meeting all other requirements   Today's Visits Date Type Provider Dept  04/14/19 Office Visit [name], MD [facility]  Showing today's visits and meeting all other requirements   Future Appointments No visits were found meeting these conditions.  Showing future appointments within next 90 days and meeting all other requirements   I discussed the assessment and treatment plan with the patient. The patient was provided an opportunity to ask questions and all were answered. The patient agreed with the plan and demonstrated an understanding of the instructions.  Patient advised to call back or seek an in-person evaluation if the symptoms or condition worsens.  Total duration of non-face-to-face encounter: 25 minutes.  Note by: [name], MD Date: 04/14/2019; Time: 10:19 AM  Note: This dictation was prepared with Dragon dictation. Any  transcriptional errors that may result from this process are unintentional.  Disclaimer:  * Given the special circumstances of the COVID-19 pandemic, the federal government has announced that the Office for Civil Rights (OCR) will exercise its enforcement discretion and will not impose penalties on physicians using telehealth in the event of noncompliance with regulatory requirements under the [place] and [place] (HIPAA) in connection with the good faith provision of telehealth during the XX123456 national public health emergency. ([place])

## 2019-04-15 DIAGNOSIS — G894 Chronic pain syndrome: Secondary | ICD-10-CM | POA: Diagnosis not present

## 2019-04-20 ENCOUNTER — Other Ambulatory Visit: Payer: Self-pay | Admitting: Pharmacy Technician

## 2019-04-20 LAB — TOXASSURE SELECT 13 (MW), URINE

## 2019-04-20 NOTE — Patient Outreach (Signed)
[facility]) Care Management  04/20/2019  GOVAN LINKE Apr 26, 1941 [id number]    Successful call placed to patient regarding patient assistance medication delivery of Ozempic with [employer], HIPAA identifiers verified. Patient confirmed picking up 5 boxes of Ozempic. Patient informed he had no questions or concerns.Cofirmed patient had name and number. Patient will have to re enroll in January 2021.   Follow up:  Will route note to embedded [place] for case closure. Will remove myself from care team.  [name]. Ellisa Devivo, [place] Management [phone]

## 2019-04-29 ENCOUNTER — Ambulatory Visit: Payer: PPO | Admitting: Podiatry

## 2019-04-30 ENCOUNTER — Other Ambulatory Visit: Payer: Self-pay | Admitting: Family Medicine

## 2019-05-02 ENCOUNTER — Other Ambulatory Visit: Payer: Self-pay | Admitting: Family Medicine

## 2019-05-05 ENCOUNTER — Other Ambulatory Visit: Payer: Self-pay | Admitting: Family Medicine

## 2019-05-06 ENCOUNTER — Other Ambulatory Visit: Payer: Self-pay | Admitting: Family Medicine

## 2019-05-06 DIAGNOSIS — E782 Mixed hyperlipidemia: Secondary | ICD-10-CM

## 2019-05-10 ENCOUNTER — Other Ambulatory Visit: Payer: Self-pay | Admitting: Family Medicine

## 2019-05-12 ENCOUNTER — Other Ambulatory Visit: Payer: Self-pay | Admitting: Family Medicine

## 2019-05-12 DIAGNOSIS — E782 Mixed hyperlipidemia: Secondary | ICD-10-CM

## 2019-05-13 ENCOUNTER — Other Ambulatory Visit: Payer: Self-pay | Admitting: Family Medicine

## 2019-05-16 ENCOUNTER — Other Ambulatory Visit: Payer: Self-pay | Admitting: Family Medicine

## 2019-05-16 DIAGNOSIS — E559 Vitamin D deficiency, unspecified: Secondary | ICD-10-CM

## 2019-05-18 ENCOUNTER — Telehealth: Payer: Self-pay | Admitting: Family Medicine

## 2019-05-18 ENCOUNTER — Telehealth: Payer: Self-pay | Admitting: *Deleted

## 2019-05-18 DIAGNOSIS — E785 Hyperlipidemia, unspecified: Secondary | ICD-10-CM

## 2019-05-18 DIAGNOSIS — E1142 Type 2 diabetes mellitus with diabetic polyneuropathy: Secondary | ICD-10-CM

## 2019-05-18 NOTE — Telephone Encounter (Signed)
Please place future orders for lab appt.  

## 2019-05-18 NOTE — Telephone Encounter (Signed)
Ordered

## 2019-05-18 NOTE — Telephone Encounter (Signed)
Pt called wanting to talk about his Semaglutide,0.25 or 0.5MG /DOS, (OZEMPIC, 0.25 OR 0.5 MG/DOSE,) 2 MG/1.5ML SOPN

## 2019-05-19 ENCOUNTER — Ambulatory Visit: Payer: Self-pay | Admitting: Pharmacist

## 2019-05-19 DIAGNOSIS — E1142 Type 2 diabetes mellitus with diabetic polyneuropathy: Secondary | ICD-10-CM

## 2019-05-19 NOTE — Telephone Encounter (Signed)
Talked to patient.   He is coming in for lab work on 1/4. Could someone print [email or web address]  Pages 6 and 7 for patient's signature? Thank you!

## 2019-05-19 NOTE — Patient Instructions (Signed)
Visit Information  Goals Addressed            This Visit's Progress     Patient Stated   . "My blood sugars are too high" (pt-stated)       Current Barriers:  . Diabetes: uncontrolled, most recent A1c 8.5% o Called clinic w/ question about Ozempic  . Current antihyperglycemic regimen: Jardiance 10 mg daily, Ozempic 0.5 mg  o Approved for Ozempic assistance through 05/21/2019 o Denied for Jardiance assistance d/t being over income . Denies hypoglycemic symptoms, including dizziness, lightheadedness, shaking, sweating . Exercise: plans on joining the gym w/ a friend and going 3 days a week . Cardiovascular risk reduction: o Antihypertensive regimen: lisinopril/HCTZ 20/25 mg daily, metoprolol succinate 25 mg daily o Antihyperlipemic regimen: rosuvastatin 40 mg daily  o Antiplatelet regimen: ASA 81 mg   Pharmacist Clinical Goal(s):  Marland Kitchen Over the next 90 days, patient will work with PharmD and primary care provider to address optimized glycemic management  Interventions: . Patient wanted to know about the application process for 2021 for Ozempic patient assistance. He is coming in for lab work on 05/25/2019. Will collaborate w/ front office staff to prepare the patient portion of the application for his signature when he comes in at that time. He will also bring updated financial information. Will collaborate w/ Dr. Caryl Bis for his portion of the application.   Patient Self Care Activities:  . Patient will check blood glucose BID , document, and provide at future appointments . Patient will take medications as prescribed . Patient will report any questions or concerns to provider   Please see past updates related to this goal by clicking on the "Past Updates" button in the selected goal         The patient verbalized understanding of instructions provided today and declined a print copy of patient instruction materials.   Plan: - Will collaborate w/ patient, provider, and CPhT  as above  Catie Darnelle Maffucci, PharmD, [place], [place] [phone]

## 2019-05-19 NOTE — Chronic Care Management (AMB) (Signed)
Chronic Care Management   Follow Up Note   05/19/2019 Name: James Hood MRN: [id number] DOB: 1940/07/13  Referred by: Leone Haven, MD Reason for referral : Chronic Care Management (Medication Management)   James Hood is a 78 y.o. year old male who is a primary care patient of Caryl Bis, Angela Adam, MD. The CCM team was consulted for assistance with chronic disease management and care coordination needs.    Received call from patient with medication management questions.  Review of patient status, including review of consultants reports, relevant laboratory and other test results, and collaboration with appropriate care team members and the patient's provider was performed as part of comprehensive patient evaluation and provision of chronic care management services.    SDOH (Social Determinants of Health) screening performed today: Financial Strain . See Care Plan for related entries.   Outpatient Encounter Medications as of 05/19/2019  Medication Sig  . aspirin EC 81 MG tablet Take 81 mg by mouth daily.  Marland Kitchen buPROPion (WELLBUTRIN SR) 150 MG 12 hr tablet TAKE ONE TABLET BY MOUTH TWICE A DAY  . chlorhexidine (PERIDEX) 0.12 % solution   . diphenoxylate-atropine (LOMOTIL) 2.5-0.025 MG tablet Take 1 tablet by mouth 2 (two) times daily.   . divalproex (DEPAKOTE ER) 500 MG 24 hr tablet Take 1 tablet by mouth 2 (two) times daily.  Marland Kitchen donepezil (ARICEPT) 10 MG tablet Take 10 mg by mouth at bedtime.  . empagliflozin (JARDIANCE) 10 MG TABS tablet Take 10 mg by mouth daily before breakfast.  . fenofibrate (TRICOR) 145 MG tablet TAKE ONE TABLET BY MOUTH DAILY  . finasteride (PROSCAR) 5 MG tablet TAKE ONE TABLET BY MOUTH DAILY  . gabapentin (NEURONTIN) 600 MG tablet TAKE TWO TABLETS BY MOUTH THREE TIMES A DAY  . lisinopril-hydrochlorothiazide (ZESTORETIC) 20-25 MG tablet TAKE ONE TABLET BY MOUTH TWICE A DAY  . Melatonin 10 MG TABS Take 20 mg by mouth at bedtime.  . memantine  (NAMENDA) 10 MG tablet Take 10 mg by mouth 2 (two) times daily.   . metoprolol succinate (TOPROL-XL) 25 MG 24 hr tablet Take 25 mg by mouth at bedtime.   . Multiple Vitamins-Minerals (CENTRUM SILVER PO) Take 1 tablet by mouth daily.  . nicotine (NICODERM CQ) 14 mg/24hr patch Place 1 patch (14 mg total) onto the skin daily.  . nicotine polacrilex (COMMIT) 4 MG lozenge Take 1 lozenge (4 mg total) by mouth as needed for smoking cessation.  Marland Kitchen omeprazole (PRILOSEC) 20 MG capsule Take 20 mg by mouth 2 (two) times daily before a meal.   . oxyCODONE-acetaminophen (PERCOCET) 10-325 MG tablet Take 1 tablet by mouth every 6 (six) hours as needed for pain. Must last 30 days. Max #100/month  . [START ON 06/17/2019] oxyCODONE-acetaminophen (PERCOCET) 10-325 MG tablet Take 1 tablet by mouth every 6 (six) hours as needed for pain. Must last 30 days. Max #100/month  . primidone (MYSOLINE) 50 MG tablet Take 150-250 mg by mouth 2 (two) times daily. Take 250 mg by mouth in the morning & take 150 mg at night.  . QUEtiapine (SEROQUEL) 25 MG tablet Take 250 mg by mouth at bedtime.   . rosuvastatin (CRESTOR) 40 MG tablet TAKE ONE TABLET BY MOUTH DAILY  . Semaglutide,0.25 or 0.5MG /DOS, (OZEMPIC, 0.25 OR 0.5 MG/DOSE,) 2 MG/1.5ML SOPN Inject 0.5 mg into the skin once a week.  . tamsulosin (FLOMAX) 0.4 MG CAPS capsule TAKE ONE CAPSULE BY MOUTH DAILY AFTER SUPPER  . Vitamin D, Ergocalciferol, (DRISDOL) 1.25 MG (50000  UT) CAPS capsule TAKE 1 CAPSULE BY MOUTH EVERY 7 DAYS   No facility-administered encounter medications on file as of 05/19/2019.     Goals Addressed            This Visit's Progress     Patient Stated   . "My blood sugars are too high" (pt-stated)       Current Barriers:  . Diabetes: uncontrolled, most recent A1c 8.5% o Called clinic w/ question about Ozempic  . Current antihyperglycemic regimen: Jardiance 10 mg daily, Ozempic 0.5 mg  o Approved for Ozempic assistance through 05/21/2019 o Denied for  Jardiance assistance d/t being over income . Denies hypoglycemic symptoms, including dizziness, lightheadedness, shaking, sweating . Exercise: plans on joining the gym w/ a friend and going 3 days a week . Cardiovascular risk reduction: o Antihypertensive regimen: lisinopril/HCTZ 20/25 mg daily, metoprolol succinate 25 mg daily o Antihyperlipemic regimen: rosuvastatin 40 mg daily  o Antiplatelet regimen: ASA 81 mg   Pharmacist Clinical Goal(s):  Marland Kitchen Over the next 90 days, patient will work with PharmD and primary care provider to address optimized glycemic management  Interventions: . Patient wanted to know about the application process for 2021 for Ozempic patient assistance. He is coming in for lab work on 05/25/2019. Will collaborate w/ front office staff to prepare the patient portion of the application for his signature when he comes in at that time. He will also bring updated financial information. Will collaborate w/ Dr. Caryl Bis for his portion of the application.   Patient Self Care Activities:  . Patient will check blood glucose BID , document, and provide at future appointments . Patient will take medications as prescribed . Patient will report any questions or concerns to provider   Please see past updates related to this goal by clicking on the "Past Updates" button in the selected goal          Plan: - Will collaborate w/ patient, provider, and CPhT as above  Catie Darnelle Maffucci, PharmD, [place], [place] Pharmacist [facility] [phone]

## 2019-05-20 ENCOUNTER — Other Ambulatory Visit: Payer: Self-pay

## 2019-05-25 ENCOUNTER — Ambulatory Visit (INDEPENDENT_AMBULATORY_CARE_PROVIDER_SITE_OTHER): Payer: PPO | Admitting: Family Medicine

## 2019-05-25 ENCOUNTER — Other Ambulatory Visit (INDEPENDENT_AMBULATORY_CARE_PROVIDER_SITE_OTHER): Payer: PPO

## 2019-05-25 ENCOUNTER — Other Ambulatory Visit: Payer: Self-pay

## 2019-05-25 ENCOUNTER — Ambulatory Visit: Payer: PPO | Admitting: Pharmacist

## 2019-05-25 ENCOUNTER — Encounter: Payer: Self-pay | Admitting: Family Medicine

## 2019-05-25 DIAGNOSIS — E1142 Type 2 diabetes mellitus with diabetic polyneuropathy: Secondary | ICD-10-CM

## 2019-05-25 DIAGNOSIS — W19XXXA Unspecified fall, initial encounter: Secondary | ICD-10-CM | POA: Diagnosis not present

## 2019-05-25 DIAGNOSIS — I10 Essential (primary) hypertension: Secondary | ICD-10-CM | POA: Diagnosis not present

## 2019-05-25 DIAGNOSIS — E785 Hyperlipidemia, unspecified: Secondary | ICD-10-CM

## 2019-05-25 DIAGNOSIS — E041 Nontoxic single thyroid nodule: Secondary | ICD-10-CM | POA: Diagnosis not present

## 2019-05-25 DIAGNOSIS — H2513 Age-related nuclear cataract, bilateral: Secondary | ICD-10-CM | POA: Diagnosis not present

## 2019-05-25 LAB — LDL CHOLESTEROL, DIRECT: Direct LDL: 46 mg/dL

## 2019-05-25 LAB — COMPREHENSIVE METABOLIC PANEL
ALT: 22 U/L (ref 0–53)
AST: 29 U/L (ref 0–37)
Albumin: 4 g/dL (ref 3.5–5.2)
Alkaline Phosphatase: 50 U/L (ref 39–117)
BUN: 32 mg/dL — ABNORMAL HIGH (ref 6–23)
CO2: 31 mEq/L (ref 19–32)
Calcium: 9.2 mg/dL (ref 8.4–10.5)
Chloride: 100 mEq/L (ref 96–112)
Creatinine, Ser: 1.2 mg/dL (ref 0.40–1.50)
GFR: 58.36 mL/min — ABNORMAL LOW (ref >60.00)
Glucose, Bld: 144 mg/dL — ABNORMAL HIGH (ref 70–99)
Potassium: 3.8 mEq/L (ref 3.5–5.1)
Sodium: 140 mEq/L (ref 135–145)
Total Bilirubin: 0.2 mg/dL (ref 0.2–1.2)
Total Protein: 6.6 g/dL (ref 6.0–8.3)

## 2019-05-25 LAB — HEMOGLOBIN A1C: Hgb A1c MFr Bld: 7.7 % — ABNORMAL HIGH (ref 4.6–6.5)

## 2019-05-25 LAB — LIPID PANEL
Cholesterol: 120 mg/dL (ref 0–200)
HDL: 40.5 mg/dL (ref >39.00)
NonHDL: 79.62
Total CHOL/HDL Ratio: 3
Triglycerides: 257 mg/dL — ABNORMAL HIGH (ref 0.0–149.0)
VLDL: 51.4 mg/dL — ABNORMAL HIGH (ref 0.0–40.0)

## 2019-05-25 LAB — HM DIABETES EYE EXAM

## 2019-05-25 NOTE — Assessment & Plan Note (Signed)
Stable on ultrasound ordered by me previously.  He will continue yearly follow-up and monitor for any changes in his neck.

## 2019-05-25 NOTE — Assessment & Plan Note (Signed)
Discussed falls precautions.  He is unable to remove the lip from the steps so he will be more careful when he walks upstairs and try to use a railing.

## 2019-05-25 NOTE — Patient Instructions (Signed)
Visit Information  Goals Addressed            This Visit's Progress     Patient Stated   . "My blood sugars are too high" (pt-stated)       Current Barriers:  . Diabetes: uncontrolled, most recent A1c 8.5%; lab work being checked today. . Current antihyperglycemic regimen: Jardiance 10 mg daily, Ozempic 0.5 mg  o Approved for Ozempic assistance through 05/21/2019 - due to reapply o Denied for Jardiance assistance d/t being over income. Notes that he filled the prescription last week and his copay was ~$128, wonders if that changes anything with his previous denial from [place] assistance program . Denies hypoglycemic symptoms . Exercise: plans on joining the gym w/ a friend and going 3 days a week, though notes the friend is starting to be worried about going out in public d/t increases in COVID cases  . Cardiovascular risk reduction: o Antihypertensive regimen: lisinopril/HCTZ 20/25 mg daily, metoprolol succinate 25 mg daily o Antihyperlipemic regimen: rosuvastatin 40 mg daily  o Antiplatelet regimen: ASA 81 mg   Pharmacist Clinical Goal(s):  Marland Kitchen Over the next 90 days, patient will work with PharmD and primary care provider to address optimized glycemic management  Interventions: . Patient signed his portion of the application and dropped off income information today while he was getting lab work. Will collaborate w/ Dr. Caryl Bis on his portion of the application.  Marland Kitchen Pending A1c, can increase Ozempic to 1 mg weekly if needed.  . Reviewed that he likely hit the Medicare Coverage Gap last week, but that it has reset with the new calendar year, so Jardiance copay has likely gone back to normal for next fill. Will continue to evaluate if patient will qualify for Jardiance appeal based on any program eligibility changes for this year.   Patient Self Care Activities:  . Patient will check blood glucose BID , document, and provide at future appointments . Patient will take medications as  prescribed . Patient will report any questions or concerns to provider   Please see past updates related to this goal by clicking on the "Past Updates" button in the selected goal         The patient verbalized understanding of instructions provided today and declined a print copy of patient instruction materials.   Plan: - Will collaborate w/ patient, provider, and CPhT as above - Scheduled telephonic outreach 06/29/19 @2 :30 pm   Catie Darnelle Maffucci, PharmD, [place], CPP Clinical Pharmacist [place] [phone]

## 2019-05-25 NOTE — Chronic Care Management (AMB) (Signed)
Chronic Care Management   Follow Up Note   05/25/2019 Name: James Hood MRN: [id number] DOB: January 10, 1941  Referred by: Leone Haven, MD Reason for referral : Chronic Care Management (Medication Management)   James Hood is a 79 y.o. year old male who is a primary care patient of Caryl Bis, Angela Adam, MD. The CCM team was consulted for assistance with chronic disease management and care coordination needs.    Contacted patient for care coordination today.   Review of patient status, including review of consultants reports, relevant laboratory and other test results, and collaboration with appropriate care team members and the patient's provider was performed as part of comprehensive patient evaluation and provision of chronic care management services.    SDOH (Social Determinants of Health) screening performed today: Financial Strain . See Care Plan for related entries.   Outpatient Encounter Medications as of 05/25/2019  Medication Sig  . Semaglutide,0.25 or 0.5MG /DOS, (OZEMPIC, 0.25 OR 0.5 MG/DOSE,) 2 MG/1.5ML SOPN Inject 0.5 mg into the skin once a week.  Marland Kitchen aspirin EC 81 MG tablet Take 81 mg by mouth daily.  Marland Kitchen buPROPion (WELLBUTRIN SR) 150 MG 12 hr tablet TAKE ONE TABLET BY MOUTH TWICE A DAY  . chlorhexidine (PERIDEX) 0.12 % solution   . diphenoxylate-atropine (LOMOTIL) 2.5-0.025 MG tablet Take 1 tablet by mouth 2 (two) times daily.   . divalproex (DEPAKOTE ER) 500 MG 24 hr tablet Take 1 tablet by mouth 2 (two) times daily.  Marland Kitchen donepezil (ARICEPT) 10 MG tablet Take 10 mg by mouth at bedtime.  . empagliflozin (JARDIANCE) 10 MG TABS tablet Take 10 mg by mouth daily before breakfast.  . fenofibrate (TRICOR) 145 MG tablet TAKE ONE TABLET BY MOUTH DAILY  . finasteride (PROSCAR) 5 MG tablet TAKE ONE TABLET BY MOUTH DAILY  . gabapentin (NEURONTIN) 600 MG tablet TAKE TWO TABLETS BY MOUTH THREE TIMES A DAY  . lisinopril-hydrochlorothiazide (ZESTORETIC) 20-25 MG tablet TAKE ONE  TABLET BY MOUTH TWICE A DAY  . Melatonin 10 MG TABS Take 20 mg by mouth at bedtime.  . memantine (NAMENDA) 10 MG tablet Take 10 mg by mouth 2 (two) times daily.   . metoprolol succinate (TOPROL-XL) 25 MG 24 hr tablet Take 25 mg by mouth at bedtime.   . Multiple Vitamins-Minerals (CENTRUM SILVER PO) Take 1 tablet by mouth daily.  . nicotine (NICODERM CQ) 14 mg/24hr patch Place 1 patch (14 mg total) onto the skin daily.  . nicotine polacrilex (COMMIT) 4 MG lozenge Take 1 lozenge (4 mg total) by mouth as needed for smoking cessation.  Marland Kitchen omeprazole (PRILOSEC) 20 MG capsule Take 20 mg by mouth 2 (two) times daily before a meal.   . oxyCODONE-acetaminophen (PERCOCET) 10-325 MG tablet Take 1 tablet by mouth every 6 (six) hours as needed for pain. Must last 30 days. Max #100/month  . [START ON 06/17/2019] oxyCODONE-acetaminophen (PERCOCET) 10-325 MG tablet Take 1 tablet by mouth every 6 (six) hours as needed for pain. Must last 30 days. Max #100/month  . primidone (MYSOLINE) 50 MG tablet Take 150-250 mg by mouth 2 (two) times daily. Take 250 mg by mouth in the morning & take 150 mg at night.  . QUEtiapine (SEROQUEL) 25 MG tablet Take 250 mg by mouth at bedtime.   . rosuvastatin (CRESTOR) 40 MG tablet TAKE ONE TABLET BY MOUTH DAILY  . tamsulosin (FLOMAX) 0.4 MG CAPS capsule TAKE ONE CAPSULE BY MOUTH DAILY AFTER SUPPER  . Vitamin D, Ergocalciferol, (DRISDOL) 1.25 MG (50000 UT)  CAPS capsule TAKE 1 CAPSULE BY MOUTH EVERY 7 DAYS   No facility-administered encounter medications on file as of 05/25/2019.     Goals Addressed            This Visit's Progress     Patient Stated   . "My blood sugars are too high" (pt-stated)       Current Barriers:  . Diabetes: uncontrolled, most recent A1c 8.5%; lab work being checked today. . Current antihyperglycemic regimen: Jardiance 10 mg daily, Ozempic 0.5 mg  o Approved for Ozempic assistance through 05/21/2019 - due to reapply o Denied for Jardiance assistance d/t  being over income. Notes that he filled the prescription last week and his copay was ~$128, wonders if that changes anything with his previous denial from [place] assistance program . Denies hypoglycemic symptoms . Exercise: plans on joining the gym w/ a friend and going 3 days a week, though notes the friend is starting to be worried about going out in public d/t increases in COVID cases  . Cardiovascular risk reduction: o Antihypertensive regimen: lisinopril/HCTZ 20/25 mg daily, metoprolol succinate 25 mg daily o Antihyperlipemic regimen: rosuvastatin 40 mg daily  o Antiplatelet regimen: ASA 81 mg   Pharmacist Clinical Goal(s):  Marland Kitchen Over the next 90 days, patient will work with PharmD and primary care provider to address optimized glycemic management  Interventions: . Patient signed his portion of the application and dropped off income information today while he was getting lab work. Will collaborate w/ Dr. Caryl Bis on his portion of the application.  Marland Kitchen Pending A1c, can increase Ozempic to 1 mg weekly if needed.  . Reviewed that he likely hit the Medicare Coverage Gap last week, but that it has reset with the new calendar year, so Jardiance copay has likely gone back to normal for next fill. Will continue to evaluate if patient will qualify for Jardiance appeal based on any program eligibility changes for this year.   Patient Self Care Activities:  . Patient will check blood glucose BID , document, and provide at future appointments . Patient will take medications as prescribed . Patient will report any questions or concerns to provider   Please see past updates related to this goal by clicking on the "Past Updates" button in the selected goal          Plan: - Will collaborate w/ patient, provider, and CPhT as above - Scheduled telephonic outreach 06/29/19 @2 :30 pm   Catie Darnelle Maffucci, PharmD, [place], [place] Pharmacist [place] [phone]

## 2019-05-25 NOTE — Assessment & Plan Note (Signed)
Has been well controlled.  He will continue his current medications.  Lab work drawn earlier today.  We will contact him with the results.

## 2019-05-25 NOTE — Assessment & Plan Note (Signed)
CBGs have improved with addition of Ozempic.  We will await his A1c results.  We will continue Ozempic and Jardiance.

## 2019-05-25 NOTE — Progress Notes (Signed)
Virtual Visit via telephone Note  This visit type was conducted due to national recommendations for restrictions regarding the COVID-19 pandemic (e.g. social distancing).  This format is felt to be most appropriate for this patient at this time.  All issues noted in this document were discussed and addressed.  No physical exam was performed (except for noted visual exam findings with Video Visits).   I connected with James Hood today at  2:45 PM EST by a video enabled telemedicine application or telephone and verified that I am speaking with the correct person using two identifiers. Location patient: car Location provider: work  Persons participating in the virtual visit: patient, provider  I discussed the limitations, risks, security and privacy concerns of performing an evaluation and management service by telephone and the availability of in person appointments. I also discussed with the patient that there may be a patient responsible charge related to this service. The patient expressed understanding and agreed to proceed.  Interactive audio and video telecommunications were attempted between this provider and patient, however failed, due to patient having technical difficulties OR patient did not have access to video capability.  We continued and completed visit with audio only.   Reason for visit: follow-up  HPI: HYPERTENSION  Disease Monitoring  Home BP Monitoring no Chest pain- no    Dyspnea- no Medications  Compliance-  Taking lisinopril, HCTZ, metoprolol.   Edema- no  DIABETES Disease Monitoring: Blood Sugar ranges-120s-150s Polyuria/phagia/dipsia- no      Optho- UTD Medications: Compliance- taking ozempic, jardiance Hypoglycemic symptoms- no  Thyroid nodule: follow-up visit with ENT a couple months ago. No growths in the thyroid area or dysphagia. [place] was stable.  Patient notes he was to follow-up in 1 year.  Fall: Patient notes a fall about a month ago.  He tripped  going up the steps to his house.  There is a lip on the step and he caught his foot on it.  He banged up his knee and elbow though notes those things are healed at this time.  ROS: See pertinent positives and negatives per HPI.  Past Medical History:  Diagnosis Date  . Anxiety   . Anxiety and depression   . Arthritis   . BPH (benign prostatic hyperplasia)   . 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 Colon polyps   . Dementia ([employer])   . Depression   . Diabetes mellitus type 2 in nonobese (HCC)   . GERD (gastroesophageal reflux disease)    BARRETTS ESOPHAGUS RESOLVED PER PATIENT  . Headache   . 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 History of hiatal hernia   . Hypercholesteremia   . Hypertension   . Hyperthyroidism   . Neuropathic pain   . Primary localized osteoarthritis of right knee   . 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 Stomach ulcer   . Tremor, essential   . Wound infection complicating hardware ([place]) 04/02/2018    Past Surgical History:  Procedure Laterality Date  . BACK SURGERY    . COLONOSCOPY WITH PROPOFOL N/A 09/14/2016   Procedure: COLONOSCOPY WITH PROPOFOL;  Surgeon: Manya Silvas, MD;  Location: [facility] ENDOSCOPY;  Service: Endoscopy;  Laterality: N/A;  . esophageal stretch    . JOINT REPLACEMENT Right 2016   knee  . LUMBAR LAMINECTOMY/DECOMPRESSION MICRODISCECTOMY Left 02/03/2016   Procedure: LEFT L5-S1 DISKECTOMY;  Surgeon: Leeroy Cha, MD;  Location: [place] NEURO ORS;  Service: Neurosurgery;  Laterality: Left;  LEFT  L5-S1 DISKECTOMY  . PULSE GENERATOR IMPLANT N/A 08/21/2017   Procedure: UNILATERAL PULSE GENERATOR IMPLANT;  Surgeon: Meade Maw, MD;  Location: [facility] ORS;  Service: Neurosurgery;  Laterality: N/A;  . PULSE GENERATOR IMPLANT Right 04/02/2018   Procedure: REMOVAL OF PULSE GENERATOR IMPLANT AND LEADS;  Surgeon: Meade Maw, MD;  Location: [facility] ORS;  Service: Neurosurgery;  Laterality: Right;  .  TONSILLECTOMY    . TOTAL KNEE ARTHROPLASTY Right 11/15/2014   Procedure: TOTAL KNEE ARTHROPLASTY;  Surgeon: Elsie Saas, MD;  Location: [place];  Service: Orthopedics;  Laterality: Right;    Family History  Problem Relation Age of Onset  . Heart attack Mother   . Heart disease Mother   . Hypertension Father   . Depression Father   . Kidney cancer Neg Hx   . Prostate cancer Neg Hx     SOCIAL HX: Current smoker   Current Outpatient Medications:  .  aspirin EC 81 MG tablet, Take 81 mg by mouth daily., Disp: , Rfl:  .  buPROPion (WELLBUTRIN SR) 150 MG 12 hr tablet, TAKE ONE TABLET BY MOUTH TWICE A DAY, Disp: 180 tablet, Rfl: 1 .  chlorhexidine (PERIDEX) 0.12 % solution, , Disp: , Rfl:  .  diphenoxylate-atropine (LOMOTIL) 2.5-0.025 MG tablet, Take 1 tablet by mouth 2 (two) times daily. , Disp: , Rfl:  .  divalproex (DEPAKOTE ER) 500 MG 24 hr tablet, Take 1 tablet by mouth 2 (two) times daily., Disp: , Rfl:  .  donepezil (ARICEPT) 10 MG tablet, Take 10 mg by mouth at bedtime., Disp: , Rfl:  .  empagliflozin (JARDIANCE) 10 MG TABS tablet, Take 10 mg by mouth daily before breakfast., Disp: 30 tablet, Rfl: 3 .  fenofibrate (TRICOR) 145 MG tablet, TAKE ONE TABLET BY MOUTH DAILY, Disp: 90 tablet, Rfl: 1 .  finasteride (PROSCAR) 5 MG tablet, TAKE ONE TABLET BY MOUTH DAILY, Disp: 90 tablet, Rfl: 0 .  gabapentin (NEURONTIN) 600 MG tablet, TAKE TWO TABLETS BY MOUTH THREE TIMES A DAY, Disp: 180 tablet, Rfl: 0 .  lisinopril-hydrochlorothiazide (ZESTORETIC) 20-25 MG tablet, TAKE ONE TABLET BY MOUTH TWICE A DAY, Disp: 180 tablet, Rfl: 1 .  Melatonin 10 MG TABS, Take 20 mg by mouth at bedtime., Disp: , Rfl:  .  memantine (NAMENDA) 10 MG tablet, Take 10 mg by mouth 2 (two) times daily. , Disp: , Rfl:  .  metoprolol succinate (TOPROL-XL) 25 MG 24 hr tablet, Take 25 mg by mouth at bedtime. , Disp: , Rfl:  .  Multiple Vitamins-Minerals (CENTRUM SILVER PO), Take 1 tablet by mouth daily., Disp: , Rfl:  .   nicotine (NICODERM CQ) 14 mg/24hr patch, Place 1 patch (14 mg total) onto the skin daily., Disp: 28 patch, Rfl: 5 .  nicotine polacrilex (COMMIT) 4 MG lozenge, Take 1 lozenge (4 mg total) by mouth as needed for smoking cessation., Disp: 72 tablet, Rfl: 5 .  omeprazole (PRILOSEC) 20 MG capsule, Take 20 mg by mouth 2 (two) times daily before a meal. , Disp: , Rfl:  .  oxyCODONE-acetaminophen (PERCOCET) 10-325 MG tablet, Take 1 tablet by mouth every 6 (six) hours as needed for pain. Must last 30 days. Max #100/month, Disp: 100 tablet, Rfl: 0 .  [START ON 06/17/2019] oxyCODONE-acetaminophen (PERCOCET) 10-325 MG tablet, Take 1 tablet by mouth every 6 (six) hours as needed for pain. Must last 30 days. Max #100/month, Disp: 100 tablet, Rfl: 0 .  primidone (MYSOLINE) 50 MG tablet, Take 150-250 mg by mouth 2 (two) times  daily. Take 250 mg by mouth in the morning & take 150 mg at night., Disp: , Rfl:  .  QUEtiapine (SEROQUEL) 25 MG tablet, Take 250 mg by mouth at bedtime. , Disp: , Rfl:  .  rosuvastatin (CRESTOR) 40 MG tablet, TAKE ONE TABLET BY MOUTH DAILY, Disp: 90 tablet, Rfl: 0 .  Semaglutide,0.25 or 0.5MG /DOS, (OZEMPIC, 0.25 OR 0.5 MG/DOSE,) 2 MG/1.5ML SOPN, Inject 0.5 mg into the skin once a week., Disp: 1 pen, Rfl: 0 .  tamsulosin (FLOMAX) 0.4 MG CAPS capsule, TAKE ONE CAPSULE BY MOUTH DAILY AFTER SUPPER, Disp: 90 capsule, Rfl: 0 .  Vitamin D, Ergocalciferol, (DRISDOL) 1.25 MG (50000 UT) CAPS capsule, TAKE 1 CAPSULE BY MOUTH EVERY 7 DAYS, Disp: 8 capsule, Rfl: 0  EXAM: This was a telehealth telephone visit and thus no physical exam was completed.   ASSESSMENT AND PLAN:  Discussed the following assessment and plan:  Hypertension Has been well controlled.  He will continue his current medications.  Lab work drawn earlier today.  We will contact him with the results.  Thyroid nodule Stable on ultrasound ordered by me previously.  He will continue yearly follow-up and monitor for any changes in his  neck.  Type 2 diabetes mellitus ([place]) CBGs have improved with addition of Ozempic.  We will await his A1c results.  We will continue Ozempic and Jardiance.  Falls Discussed falls precautions.  He is unable to remove the lip from the steps so he will be more careful when he walks upstairs and try to use a railing.   No orders of the defined types were placed in this encounter.   No orders of the defined types were placed in this encounter.    I discussed the assessment and treatment plan with the patient. The patient was provided an opportunity to ask questions and all were answered. The patient agreed with the plan and demonstrated an understanding of the instructions.   The patient was advised to call back or seek an in-person evaluation if the symptoms worsen or if the condition fails to improve as anticipated.  I provided 14 minutes of non-face-to-face time during this encounter.   Tommi Rumps, MD

## 2019-05-27 ENCOUNTER — Ambulatory Visit: Payer: PPO | Admitting: Podiatry

## 2019-06-01 DIAGNOSIS — R251 Tremor, unspecified: Secondary | ICD-10-CM | POA: Diagnosis not present

## 2019-06-01 DIAGNOSIS — Z720 Tobacco use: Secondary | ICD-10-CM | POA: Diagnosis not present

## 2019-06-01 DIAGNOSIS — R4189 Other symptoms and signs involving cognitive functions and awareness: Secondary | ICD-10-CM | POA: Diagnosis not present

## 2019-06-01 DIAGNOSIS — R262 Difficulty in walking, not elsewhere classified: Secondary | ICD-10-CM | POA: Diagnosis not present

## 2019-06-01 DIAGNOSIS — G479 Sleep disorder, unspecified: Secondary | ICD-10-CM | POA: Diagnosis not present

## 2019-06-02 ENCOUNTER — Telehealth: Payer: Self-pay | Admitting: Family Medicine

## 2019-06-02 NOTE — Telephone Encounter (Signed)
Pt was returning a phone call. Please CB-thanks!

## 2019-06-04 ENCOUNTER — Other Ambulatory Visit: Payer: Self-pay | Admitting: Pharmacy Technician

## 2019-06-04 NOTE — Patient Outreach (Signed)
[facility]) Care Management  06/04/2019  James Hood [birth year] [id number]    Received both patient and provider portion(s) of patient assistance application(s) for Ozempic. Faxed completed application and required documents into [employer].  Will follow up with company(ies) in 3-5 business days to check status of application(s).  Rebekkah Powless P. [name], [place] Management [phone]

## 2019-06-07 ENCOUNTER — Other Ambulatory Visit: Payer: Self-pay | Admitting: Family Medicine

## 2019-06-08 ENCOUNTER — Ambulatory Visit: Payer: PPO | Admitting: Podiatry

## 2019-06-08 ENCOUNTER — Other Ambulatory Visit: Payer: Self-pay

## 2019-06-08 ENCOUNTER — Encounter: Payer: Self-pay | Admitting: Podiatry

## 2019-06-08 DIAGNOSIS — M7752 Other enthesopathy of left foot: Secondary | ICD-10-CM | POA: Diagnosis not present

## 2019-06-08 NOTE — Progress Notes (Signed)
He presents today for follow-up of the capsulitis the subtalar joint of the left foot.  States that it was better but now it is starting to come back.  Objective: Vital signs are stable alert oriented x3.  Pulses are palpable.  He has pain on palpation of the sinus tarsi left foot.  Has pain on end range of motion subtalar joint.  No open lesions or wounds.  Assessment: Capsulitis subtalar joint left.  Plan: Reinjected the area today discussed appropriate shoe gear follow-up with him as needed may need to consider orthotics.

## 2019-06-09 ENCOUNTER — Telehealth: Payer: Self-pay

## 2019-06-09 DIAGNOSIS — E1142 Type 2 diabetes mellitus with diabetic polyneuropathy: Secondary | ICD-10-CM

## 2019-06-09 MED ORDER — ICOSAPENT ETHYL 1 G PO CAPS: 2.0000 g | ORAL_CAPSULE | Freq: Two times a day (BID) | ORAL | 2 refills | Status: DC

## 2019-06-09 MED ORDER — OZEMPIC (0.25 OR 0.5 MG/DOSE) 2 MG/1.5ML ~~LOC~~ SOPN: 1.0000 mg | PEN_INJECTOR | SUBCUTANEOUS | 1 refills | Status: DC

## 2019-06-09 NOTE — Telephone Encounter (Signed)
Complete this form and fax in to adjust dose to 1 mg weekly:   [email or web address]

## 2019-06-09 NOTE — Telephone Encounter (Signed)
We will change his Ozempic to 1 mg once weekly.  I sent the Vascepa in.  I will forward this to Catie to see what we need to do to get his higher dose of Ozempic through his assistance.

## 2019-06-09 NOTE — Telephone Encounter (Signed)
Ok I printed it and I will give to the provider.  Carlis Blanchard,cma

## 2019-06-11 ENCOUNTER — Ambulatory Visit (INDEPENDENT_AMBULATORY_CARE_PROVIDER_SITE_OTHER): Payer: PPO | Admitting: Pharmacist

## 2019-06-11 ENCOUNTER — Encounter: Payer: Self-pay | Admitting: Pharmacist

## 2019-06-11 DIAGNOSIS — E1142 Type 2 diabetes mellitus with diabetic polyneuropathy: Secondary | ICD-10-CM

## 2019-06-11 DIAGNOSIS — E782 Mixed hyperlipidemia: Secondary | ICD-10-CM | POA: Diagnosis not present

## 2019-06-11 DIAGNOSIS — E785 Hyperlipidemia, unspecified: Secondary | ICD-10-CM

## 2019-06-11 NOTE — Patient Instructions (Signed)
Visit Information  Goals Addressed            This Visit's Progress     Patient Stated   . "My blood sugars are too high" (pt-stated)       Current Barriers:  . Diabetes: uncontrolled, most recent A1c 7.7%; o Ozempic increased to 1 mg by Dr. Caryl Bis o Patient confirms some weight loss, reduction in appetite w/ Ozempic . Current antihyperglycemic regimen: Jardiance 10 mg daily, Ozempic 0.5 mg- has not increased yet  o Currently reapplying for [employer] assistance for 2021 . Cardiovascular risk reduction: o Antihypertensive regimen: lisinopril/HCTZ 20/25 mg daily, metoprolol succinate 25 mg daily o Antihyperlipemic regimen: rosuvastatin 40 mg daily, fenofibrate 145 mg daily - LDL at goal <70, however, TG elevated >200. Vascepa started by Dr. Caryl Bis. Patient notes copay was $180 for 3 month supply o Antiplatelet regimen: ASA 81 mg   Pharmacist Clinical Goal(s):  Marland Kitchen Over the next 90 days, patient will work with PharmD and primary care provider to address optimized glycemic management  Interventions: . Provided dose increase form to Nina/Dr. Nolen Mu to complete and fax to [employer]. CPhT will be [employer] tomorrow to follow up on reapplication decision. . Counseled patient that he could do 2 injections of 0.5 mg Ozempic until 1 mg pen strength supply arrives. He verbalized understanding . Discussed cost of Vascepa. There is not a patient assistance program, but [place] Hyperlipidemia fund is open, and can provide up to $2500 per year towards cholesterol medications. Provided patient the website and phone number to contact and walk through the enrollment process with them. He will let me know the results of the call.   Patient Self Care Activities:  . Patient will check blood glucose BID , document, and provide at future appointments . Patient will take medications as prescribed . Patient will report any questions or concerns to provider   Please  see past updates related to this goal by clicking on the "Past Updates" button in the selected goal         The patient verbalized understanding of instructions provided today and declined a print copy of patient instruction materials.   Plan:  - Will collaborate w/ patient and CPhT as above - Will outreach patient as previously scheduled  Catie Darnelle Maffucci, PharmD, [place], [place] Pharmacist [place] [phone]

## 2019-06-11 NOTE — Chronic Care Management (AMB) (Signed)
Chronic Care Management   Follow Up Note   06/11/2019 Name: James Hood MRN: [id number] DOB: 10/06/1940  Referred by: Leone Haven, MD Reason for referral : Chronic Care Management (Medication Management)   James Hood is a 79 y.o. year old male who is a primary care patient of Caryl Bis, Angela Adam, MD. The CCM team was consulted for assistance with chronic disease management and care coordination needs.   Received call from patient regarding medication access needs.   Review of patient status, including review of consultants reports, relevant laboratory and other test results, and collaboration with appropriate care team members and the patient's provider was performed as part of comprehensive patient evaluation and provision of chronic care management services.    SDOH (Social Determinants of Health) screening performed today: Financial Strain . See Care Plan for related entries.   Outpatient Encounter Medications as of 06/11/2019  Medication Sig  . aspirin EC 81 MG tablet Take 81 mg by mouth daily.  Marland Kitchen buPROPion (WELLBUTRIN SR) 150 MG 12 hr tablet TAKE ONE TABLET BY MOUTH TWICE A DAY  . chlorhexidine (PERIDEX) 0.12 % solution   . diphenoxylate-atropine (LOMOTIL) 2.5-0.025 MG tablet Take 1 tablet by mouth 2 (two) times daily.   . divalproex (DEPAKOTE ER) 500 MG 24 hr tablet Take 1 tablet by mouth 2 (two) times daily.  Marland Kitchen donepezil (ARICEPT) 10 MG tablet Take 10 mg by mouth at bedtime.  . empagliflozin (JARDIANCE) 10 MG TABS tablet Take 10 mg by mouth daily before breakfast.  . fenofibrate (TRICOR) 145 MG tablet TAKE ONE TABLET BY MOUTH DAILY  . finasteride (PROSCAR) 5 MG tablet TAKE ONE TABLET BY MOUTH DAILY  . gabapentin (NEURONTIN) 600 MG tablet TAKE TWO TABLETS BY MOUTH THREE TIMES A DAY  . icosapent Ethyl (VASCEPA) 1 g capsule Take 2 capsules (2 g total) by mouth 2 (two) times daily.  Marland Kitchen lisinopril-hydrochlorothiazide (ZESTORETIC) 20-25 MG tablet TAKE ONE TABLET BY  MOUTH TWICE A DAY  . Melatonin 10 MG TABS Take 20 mg by mouth at bedtime.  . memantine (NAMENDA) 10 MG tablet Take 10 mg by mouth 2 (two) times daily.   . metoprolol succinate (TOPROL-XL) 25 MG 24 hr tablet Take 25 mg by mouth at bedtime.   . Multiple Vitamins-Minerals (CENTRUM SILVER PO) Take 1 tablet by mouth daily.  . nicotine (NICODERM CQ) 14 mg/24hr patch Place 1 patch (14 mg total) onto the skin daily.  . nicotine polacrilex (COMMIT) 4 MG lozenge Take 1 lozenge (4 mg total) by mouth as needed for smoking cessation.  Marland Kitchen omeprazole (PRILOSEC) 20 MG capsule Take 20 mg by mouth 2 (two) times daily before a meal.   . oxyCODONE-acetaminophen (PERCOCET) 10-325 MG tablet Take 1 tablet by mouth every 6 (six) hours as needed for pain. Must last 30 days. Max #100/month  . [START ON 06/17/2019] oxyCODONE-acetaminophen (PERCOCET) 10-325 MG tablet Take 1 tablet by mouth every 6 (six) hours as needed for pain. Must last 30 days. Max #100/month  . primidone (MYSOLINE) 50 MG tablet Take 150-250 mg by mouth 2 (two) times daily. Take 250 mg by mouth in the morning & take 150 mg at night.  . QUEtiapine (SEROQUEL) 25 MG tablet Take 250 mg by mouth at bedtime.   . rosuvastatin (CRESTOR) 40 MG tablet TAKE ONE TABLET BY MOUTH DAILY  . Semaglutide,0.25 or 0.5MG /DOS, (OZEMPIC, 0.25 OR 0.5 MG/DOSE,) 2 MG/1.5ML SOPN Inject 1 mg into the skin once a week.  . tamsulosin (FLOMAX) 0.4  MG CAPS capsule TAKE ONE CAPSULE BY MOUTH DAILY AFTER SUPPER  . Vitamin D, Ergocalciferol, (DRISDOL) 1.25 MG (50000 UT) CAPS capsule TAKE 1 CAPSULE BY MOUTH EVERY 7 DAYS   No facility-administered encounter medications on file as of 06/11/2019.     Objective:   Goals Addressed            This Visit's Progress     Patient Stated   . "My blood sugars are too high" (pt-stated)       Current Barriers:  . Diabetes: uncontrolled, most recent A1c 7.7%; o Ozempic increased to 1 mg by Dr. Caryl Bis o Patient confirms some weight loss,  reduction in appetite w/ Ozempic . Current antihyperglycemic regimen: Jardiance 10 mg daily, Ozempic 0.5 mg- has not increased yet  o Currently reapplying for [employer] assistance for 2021 . Cardiovascular risk reduction: o Antihypertensive regimen: lisinopril/HCTZ 20/25 mg daily, metoprolol succinate 25 mg daily o Antihyperlipemic regimen: rosuvastatin 40 mg daily, fenofibrate 145 mg daily - LDL at goal <70, however, TG elevated >200. Vascepa started by Dr. Caryl Bis. Patient notes copay was $180 for 3 month supply o Antiplatelet regimen: ASA 81 mg   Pharmacist Clinical Goal(s):  Marland Kitchen Over the next 90 days, patient will work with PharmD and primary care provider to address optimized glycemic management  Interventions: . Provided dose increase form to Nina/Dr. Nolen Mu to complete and fax to [employer]. CPhT will be [employer] tomorrow to follow up on reapplication decision. . Counseled patient that he could do 2 injections of 0.5 mg Ozempic until 1 mg pen strength supply arrives. He verbalized understanding . Discussed cost of Vascepa. There is not a patient assistance program, but [place] Hyperlipidemia fund is open, and can provide up to $2500 per year towards cholesterol medications. Provided patient the website and phone number to contact and walk through the enrollment process with them. He will let me know the results of the call.   Patient Self Care Activities:  . Patient will check blood glucose BID , document, and provide at future appointments . Patient will take medications as prescribed . Patient will report any questions or concerns to provider   Please see past updates related to this goal by clicking on the "Past Updates" button in the selected goal          Plan:  - Will collaborate w/ patient and CPhT as above - Will outreach patient as previously scheduled  Catie Darnelle Maffucci, PharmD, [place], [place] Pharmacist [facility]  [facility] [phone]

## 2019-06-12 ENCOUNTER — Other Ambulatory Visit: Payer: Self-pay | Admitting: Pharmacy Technician

## 2019-06-12 NOTE — Patient Outreach (Signed)
[facility]) Care Management  06/12/2019  James Hood [birth year] [id number]   Care coordination call placed to [place] in regards to patient's application for Ozempic.  Spoke to James Hood who informed patient was APPROVED 06/09/2019-04/19/2020. She informed a 120 days supply of medication was shipped on 06/09/2019 and would arrive at the provider's office in the next 10-14 business days.  Will follow up with patient in 10-14 business days to confirm receipt of medication.  James Hood, [place] Management [phone]

## 2019-06-15 ENCOUNTER — Other Ambulatory Visit: Payer: Self-pay | Admitting: Family Medicine

## 2019-06-15 DIAGNOSIS — E1142 Type 2 diabetes mellitus with diabetic polyneuropathy: Secondary | ICD-10-CM

## 2019-06-17 ENCOUNTER — Telehealth: Payer: Self-pay

## 2019-06-17 NOTE — Telephone Encounter (Signed)
Patient has came and picked up five boxes of Ozempic samples.

## 2019-06-17 NOTE — Telephone Encounter (Signed)
I called and informed the patient that his Ozempic has arrived and he could pick it up. Trysten Bernard,cma

## 2019-06-18 ENCOUNTER — Ambulatory Visit: Payer: Self-pay | Admitting: Pharmacist

## 2019-06-18 DIAGNOSIS — E785 Hyperlipidemia, unspecified: Secondary | ICD-10-CM | POA: Diagnosis not present

## 2019-06-18 DIAGNOSIS — E782 Mixed hyperlipidemia: Secondary | ICD-10-CM

## 2019-06-18 DIAGNOSIS — E1142 Type 2 diabetes mellitus with diabetic polyneuropathy: Secondary | ICD-10-CM

## 2019-06-18 NOTE — Chronic Care Management (AMB) (Signed)
Chronic Care Management   Follow Up Note   06/18/2019 Name: James Hood MRN: [id number] DOB: 1941/04/11  Referred by: Leone Haven, MD Reason for referral : Chronic Care Management (Medication Management)   James Hood is a 79 y.o. year old male who is a primary care patient of Caryl Bis, Angela Adam, MD. The CCM team was consulted for assistance with chronic disease management and care coordination needs.    Received call from patient with medication access news.   Review of patient status, including review of consultants reports, relevant laboratory and other test results, and collaboration with appropriate care team members and the patient's provider was performed as part of comprehensive patient evaluation and provision of chronic care management services.    SDOH (Social Determinants of Health) screening performed today: Financial Strain . See Care Plan for related entries.   Outpatient Encounter Medications as of 06/18/2019  Medication Sig  . aspirin EC 81 MG tablet Take 81 mg by mouth daily.  Marland Kitchen buPROPion (WELLBUTRIN SR) 150 MG 12 hr tablet TAKE ONE TABLET BY MOUTH TWICE A DAY  . chlorhexidine (PERIDEX) 0.12 % solution   . diphenoxylate-atropine (LOMOTIL) 2.5-0.025 MG tablet Take 1 tablet by mouth 2 (two) times daily.   . divalproex (DEPAKOTE ER) 500 MG 24 hr tablet Take 1 tablet by mouth 2 (two) times daily.  Marland Kitchen donepezil (ARICEPT) 10 MG tablet Take 10 mg by mouth at bedtime.  . fenofibrate (TRICOR) 145 MG tablet TAKE ONE TABLET BY MOUTH DAILY  . finasteride (PROSCAR) 5 MG tablet TAKE ONE TABLET BY MOUTH DAILY  . gabapentin (NEURONTIN) 600 MG tablet TAKE TWO TABLETS BY MOUTH THREE TIMES A DAY  . icosapent Ethyl (VASCEPA) 1 g capsule Take 2 capsules (2 g total) by mouth 2 (two) times daily.  Marland Kitchen JARDIANCE 10 MG TABS tablet TAKE ONE TABLET BY MOUTH DAILY BEFORE BREAKFAST  . lisinopril-hydrochlorothiazide (ZESTORETIC) 20-25 MG tablet TAKE ONE TABLET BY MOUTH TWICE A DAY   . Melatonin 10 MG TABS Take 20 mg by mouth at bedtime.  . memantine (NAMENDA) 10 MG tablet Take 10 mg by mouth 2 (two) times daily.   . metoprolol succinate (TOPROL-XL) 25 MG 24 hr tablet Take 25 mg by mouth at bedtime.   . Multiple Vitamins-Minerals (CENTRUM SILVER PO) Take 1 tablet by mouth daily.  . nicotine (NICODERM CQ) 14 mg/24hr patch Place 1 patch (14 mg total) onto the skin daily.  . nicotine polacrilex (COMMIT) 4 MG lozenge Take 1 lozenge (4 mg total) by mouth as needed for smoking cessation.  Marland Kitchen omeprazole (PRILOSEC) 20 MG capsule Take 20 mg by mouth 2 (two) times daily before a meal.   . oxyCODONE-acetaminophen (PERCOCET) 10-325 MG tablet Take 1 tablet by mouth every 6 (six) hours as needed for pain. Must last 30 days. Max #100/month  . primidone (MYSOLINE) 50 MG tablet Take 150-250 mg by mouth 2 (two) times daily. Take 250 mg by mouth in the morning & take 150 mg at night.  . QUEtiapine (SEROQUEL) 25 MG tablet Take 250 mg by mouth at bedtime.   . rosuvastatin (CRESTOR) 40 MG tablet TAKE ONE TABLET BY MOUTH DAILY  . Semaglutide,0.25 or 0.5MG /DOS, (OZEMPIC, 0.25 OR 0.5 MG/DOSE,) 2 MG/1.5ML SOPN Inject 1 mg into the skin once a week.  . tamsulosin (FLOMAX) 0.4 MG CAPS capsule TAKE ONE CAPSULE BY MOUTH DAILY AFTER SUPPER  . Vitamin D, Ergocalciferol, (DRISDOL) 1.25 MG (50000 UT) CAPS capsule TAKE 1 CAPSULE BY MOUTH EVERY  7 DAYS   No facility-administered encounter medications on file as of 06/18/2019.     Objective:   Goals Addressed            This Visit's Progress     Patient Stated   . "My blood sugars are too high" (pt-stated)       Current Barriers:  . 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 Current antihyperglycemic regimen: Jardiance 10 mg daily, Ozempic 1 mg  o APPROVED for Ozempic assistance through 05/20/20 . Cardiovascular risk reduction: o  Antihypertensive regimen: lisinopril/HCTZ 20/25 mg daily, metoprolol succinate 25 mg daily o Antihyperlipemic regimen: rosuvastatin 40 mg daily, fenofibrate 145 mg daily - LDL at goal <70, however, TG elevated >200. Vascepa started by Dr. Caryl Bis. Patient notes copay was $180 for 3 month supply.  o Antiplatelet regimen: ASA 81 mg   Pharmacist Clinical Goal(s):  Marland Kitchen Over the next 90 days, patient will work with PharmD and primary care provider to address optimized glycemic management  Interventions: . Reiterated that patient has been approved for $2500 towards copays for Vascepa, which will lower his copays for $0 for the year for this medication . Reviewed approval for Ozempic for 2021. Patient notes he picked up his Ozempic pens yesterday.   Patient Self Care Activities:  . Patient will check blood glucose BID , document, and provide at future appointments . Patient will take medications as prescribed . Patient will report any questions or concerns to provider   Please see past updates related to this goal by clicking on the "Past Updates" button in the selected goal          Plan:  - Will outreach patient for medication management follow up as previously scheduled  Catie Darnelle Maffucci, PharmD, [place], [place] Pharmacist [place] [phone]

## 2019-06-18 NOTE — Patient Instructions (Signed)
Visit Information  Goals Addressed            This Visit's Progress     Patient Stated   . "My blood sugars are too high" (pt-stated)       Current Barriers:  . 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 Current antihyperglycemic regimen: Jardiance 10 mg daily, Ozempic 1 mg  o APPROVED for Ozempic assistance through 05/20/20 . Cardiovascular risk reduction: o Antihypertensive regimen: lisinopril/HCTZ 20/25 mg daily, metoprolol succinate 25 mg daily o Antihyperlipemic regimen: rosuvastatin 40 mg daily, fenofibrate 145 mg daily - LDL at goal <70, however, TG elevated >200. Vascepa started by Dr. Caryl Bis. Patient notes copay was $180 for 3 month supply.  o Antiplatelet regimen: ASA 81 mg   Pharmacist Clinical Goal(s):  Marland Kitchen Over the next 90 days, patient will work with PharmD and primary care provider to address optimized glycemic management  Interventions: . Reiterated that patient has been approved for $2500 towards copays for Vascepa, which will lower his copays for $0 for the year for this medication . Reviewed approval for Ozempic for 2021. Patient notes he picked up his Ozempic pens yesterday.   Patient Self Care Activities:  . Patient will check blood glucose BID , document, and provide at future appointments . Patient will take medications as prescribed . Patient will report any questions or concerns to provider   Please see past updates related to this goal by clicking on the "Past Updates" button in the selected goal         The patient verbalized understanding of instructions provided today and declined a print copy of patient instruction materials.   Plan:  - Will outreach patient for medication management follow up as previously scheduled  Catie Darnelle Maffucci, PharmD, [place], [place] Pharmacist [place] [phone]

## 2019-06-25 ENCOUNTER — Other Ambulatory Visit: Payer: Self-pay | Admitting: Pharmacy Technician

## 2019-06-25 NOTE — Patient Outreach (Signed)
[facility]) Care Management  06/25/2019  ERMEL BOYCHUK 08/13/1940 [id number]    Successful call placed to patient regarding patient assistance medication delivery of Ozempic with [employer], HIPAA identifiers verified.   Patient confirmed receiving the medication. Discussed refill procedure with patient. Refills must be initiated by the provider's office. Patient to notify office when he has around 2-3 weeks supply left so that a form can be sent into [employer]. Patient denies any questions or concerns and confirms having name and number.  Follow up:  Will route note to embedded [place] for case closure and will remove myself from care team.  [name]. Lynard Postlewait, [place] Management [phone]

## 2019-06-29 ENCOUNTER — Ambulatory Visit (INDEPENDENT_AMBULATORY_CARE_PROVIDER_SITE_OTHER): Payer: PPO | Admitting: Pharmacist

## 2019-06-29 DIAGNOSIS — E782 Mixed hyperlipidemia: Secondary | ICD-10-CM | POA: Diagnosis not present

## 2019-06-29 DIAGNOSIS — E1142 Type 2 diabetes mellitus with diabetic polyneuropathy: Secondary | ICD-10-CM

## 2019-06-29 NOTE — Chronic Care Management (AMB) (Signed)
Chronic Care Management   Follow Up Note   06/29/2019 Name: MANRAJ LEMMEN MRN: [id number] DOB: 03/06/1941  Referred by: Leone Haven, MD Reason for referral : Chronic Care Management (Medication Management)   RAFIK GILLEY is a 79 y.o. year old male who is a primary care patient of Caryl Bis, Angela Adam, MD. The CCM team was consulted for assistance with chronic disease management and care coordination needs.  Contacted patient for medication management review.     Review of patient status, including review of consultants reports, relevant laboratory and other test results, and collaboration with appropriate care team members and the patient's provider was performed as part of comprehensive patient evaluation and provision of chronic care management services.    SDOH (Social Determinants of Health) screening performed today: Financial Strain . See Care Plan for related entries.   Outpatient Encounter Medications as of 06/29/2019  Medication Sig  . aspirin EC 81 MG tablet Take 81 mg by mouth daily.  Marland Kitchen buPROPion (WELLBUTRIN SR) 150 MG 12 hr tablet TAKE ONE TABLET BY MOUTH TWICE A DAY  . divalproex (DEPAKOTE ER) 500 MG 24 hr tablet Take 1 tablet by mouth 2 (two) times daily.  Marland Kitchen donepezil (ARICEPT) 10 MG tablet Take 10 mg by mouth at bedtime.  . fenofibrate (TRICOR) 145 MG tablet TAKE ONE TABLET BY MOUTH DAILY  . finasteride (PROSCAR) 5 MG tablet TAKE ONE TABLET BY MOUTH DAILY  . gabapentin (NEURONTIN) 600 MG tablet TAKE TWO TABLETS BY MOUTH THREE TIMES A DAY  . icosapent Ethyl (VASCEPA) 1 g capsule Take 2 capsules (2 g total) by mouth 2 (two) times daily.  Marland Kitchen JARDIANCE 10 MG TABS tablet TAKE ONE TABLET BY MOUTH DAILY BEFORE BREAKFAST  . lisinopril-hydrochlorothiazide (ZESTORETIC) 20-25 MG tablet TAKE ONE TABLET BY MOUTH TWICE A DAY  . Melatonin 10 MG TABS Take 20 mg by mouth at bedtime.  . memantine (NAMENDA) 10 MG tablet Take 10 mg by mouth 2 (two) times daily.   . metoprolol  succinate (TOPROL-XL) 25 MG 24 hr tablet Take 25 mg by mouth at bedtime.   . Multiple Vitamins-Minerals (CENTRUM SILVER PO) Take 1 tablet by mouth daily.  Marland Kitchen omeprazole (PRILOSEC) 20 MG capsule Take 20 mg by mouth 2 (two) times daily before a meal.   . oxyCODONE-acetaminophen (PERCOCET) 10-325 MG tablet Take 1 tablet by mouth every 6 (six) hours as needed for pain. Must last 30 days. Max #100/month  . primidone (MYSOLINE) 50 MG tablet Take 150-250 mg by mouth 2 (two) times daily. Take 250 mg by mouth in the morning & take 150 mg at night.  . QUEtiapine (SEROQUEL) 25 MG tablet Take 125 mg by mouth at bedtime.   . rosuvastatin (CRESTOR) 40 MG tablet TAKE ONE TABLET BY MOUTH DAILY  . Semaglutide,0.25 or 0.5MG /DOS, (OZEMPIC, 0.25 OR 0.5 MG/DOSE,) 2 MG/1.5ML SOPN Inject 1 mg into the skin once a week.  . tamsulosin (FLOMAX) 0.4 MG CAPS capsule TAKE ONE CAPSULE BY MOUTH DAILY AFTER SUPPER  . Vitamin D, Ergocalciferol, (DRISDOL) 1.25 MG (50000 UT) CAPS capsule TAKE 1 CAPSULE BY MOUTH EVERY 7 DAYS  . diphenoxylate-atropine (LOMOTIL) 2.5-0.025 MG tablet Take 1 tablet by mouth 2 (two) times daily.   . nicotine (NICODERM CQ) 14 mg/24hr patch Place 1 patch (14 mg total) onto the skin daily. (Patient not taking: Reported on 06/29/2019)  . nicotine polacrilex (COMMIT) 4 MG lozenge Take 1 lozenge (4 mg total) by mouth as needed for smoking cessation. (Patient not  taking: Reported on 06/29/2019)  . [DISCONTINUED] chlorhexidine (PERIDEX) 0.12 % solution    No facility-administered encounter medications on file as of 06/29/2019.     Objective:   Goals Addressed            This Visit's Progress     Patient Stated   . "My blood sugars are too high" (pt-stated)       Current Barriers:  . Diabetes: uncontrolled, most recent A1c 7.7% o Reports he received his 2nd [place] vaccine dose 06/22/19 o Does note some stomach discomfort and occasional loose stools in the past few weeks. Wonders if this is a side effect of  Vascepa . Current antihyperglycemic regimen: Jardiance 10 mg daily, Ozempic 1 mg - has been on this dose for ~2 weeks o APPROVED for Ozempic assistance through 05/20/20 o Over income for [place] assistance . Current blood glucose readings: reports he has NOT been checking recently; he notes he wanted to give the increased Ozempic dose a few weeks to work before checking sugars . Cardiovascular risk reduction: o Antihypertensive regimen: lisinopril/HCTZ 20/25 mg daily, metoprolol succinate 25 mg daily o Antihyperlipemic regimen: rosuvastatin 40 mg daily, fenofibrate 145 mg daily, Vascepa 2 g BID. Has not had lipid panel since starting Vascepa o Antiplatelet regimen: ASA 81 mg  . [place] w/ Neurology Dr. 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 however, reports he is drinking mostly Diet Mt Dew during the day. He also lays in bed and watches TV for 1-2 hours beforebd   Pharmacist Clinical Goal(s):  Marland Kitchen Over the next 90 days, patient will work with PharmD and primary care provider to address optimized glycemic management  Interventions: . Comprehensive medication review performed, medication list updated in electronic medical record.  . Reiterated the importance of BG monitoring. Encouraged to check fasting and post-prandial readings to evaluate impact of Ozempic therapy.  . Discussed that diarrhea may be a side effect of Vascepa, but may also be related to Ozempic dose increase, especially given reports of occasional stomach discomfort. Collaboratively agreed to continue current doses and see if possible side effects diminish as his body gets accustomed to these doses. If they do not resolve or  worsen, he will let me know, and we will consider reducing Ozempic dose . Reviewed good sleep hygiene principals, including avoiding caffeine after noon and avoiding screen time before bed. Recommended he choose non-caffienated beverages and watch TV in another room, not in bed. He noted he will consider trying these options.   Patient Self Care Activities:  . Patient will check blood glucose BID , document, and provide at future appointments . Patient will take medications as prescribed . Patient will report any questions or concerns to provider   Please see past updates related to this goal by clicking on the "Past Updates" button in the selected goal          Plan:  - Scheduled f/u call 08/10/19  Catie Darnelle Maffucci, PharmD, BCACP, [place] Pharmacist [place] [phone]

## 2019-06-29 NOTE — Patient Instructions (Signed)
Visit Information  Goals Addressed            This Visit's Progress     Patient Stated   . "My blood sugars are too high" (pt-stated)       Current Barriers:  . Diabetes: uncontrolled, most recent A1c 7.7% o Reports he received his 2nd [place] vaccine dose 06/22/19 o Does note some stomach discomfort and occasional loose stools in the past few weeks. Wonders if this is a side effect of Vascepa . Current antihyperglycemic regimen: Jardiance 10 mg daily, Ozempic 1 mg - has been on this dose for ~2 weeks o APPROVED for Ozempic assistance through 05/20/20 o Over income for [place] assistance . Current blood glucose readings: reports he has NOT been checking recently; he notes he wanted to give the increased Ozempic dose a few weeks to work before checking sugars . Cardiovascular risk reduction: o Antihypertensive regimen: lisinopril/HCTZ 20/25 mg daily, metoprolol succinate 25 mg daily o Antihyperlipemic regimen: rosuvastatin 40 mg daily, fenofibrate 145 mg daily, Vascepa 2 g BID. Has not had lipid panel since starting Vascepa o Antiplatelet regimen: ASA 81 mg  . [place] w/ Neurology Dr. 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 however, reports he is drinking mostly Diet Mt Dew during the day. He also lays in bed and watches TV for 1-2 hours beforebd   Pharmacist Clinical Goal(s):  Marland Kitchen Over the next 90 days, patient will work with PharmD and primary care provider to address optimized glycemic management  Interventions: . Comprehensive medication review performed, medication list updated in electronic medical record.  . Reiterated the importance of BG monitoring. Encouraged to check  fasting and post-prandial readings to evaluate impact of Ozempic therapy.  . Discussed that diarrhea may be a side effect of Vascepa, but may also be related to Ozempic dose increase, especially given reports of occasional stomach discomfort. Collaboratively agreed to continue current doses and see if possible side effects diminish as his body gets accustomed to these doses. If they do not resolve or worsen, he will let me know, and we will consider reducing Ozempic dose . Reviewed good sleep hygiene principals, including avoiding caffeine after noon and avoiding screen time before bed. Recommended he choose non-caffienated beverages and watch TV in another room, not in bed. He noted he will consider trying these options.   Patient Self Care Activities:  . Patient will check blood glucose BID , document, and provide at future appointments . Patient will take medications as prescribed . Patient will report any questions or concerns to provider   Please see past updates related to this goal by clicking on the "Past Updates" button in the selected goal         The patient verbalized understanding of instructions provided today and declined a print copy of patient instruction materials.   Plan:  - Scheduled f/u call 08/10/19  Catie Darnelle Maffucci, PharmD, BCACP, CPP Clinical Pharmacist [place] [phone]

## 2019-06-30 ENCOUNTER — Other Ambulatory Visit: Payer: Self-pay

## 2019-06-30 ENCOUNTER — Other Ambulatory Visit: Payer: Self-pay | Admitting: Family Medicine

## 2019-06-30 DIAGNOSIS — E782 Mixed hyperlipidemia: Secondary | ICD-10-CM

## 2019-06-30 MED ORDER — FENOFIBRATE 145 MG PO TABS: 145.0000 mg | ORAL_TABLET | Freq: Every day | ORAL | 1 refills | Status: DC

## 2019-06-30 NOTE — Progress Notes (Signed)
Patient requested [facility] and this was sent to [employer].  James Hood,cma

## 2019-07-05 ENCOUNTER — Other Ambulatory Visit: Payer: Self-pay | Admitting: Family Medicine

## 2019-07-08 ENCOUNTER — Other Ambulatory Visit: Payer: Self-pay | Admitting: Family Medicine

## 2019-07-08 ENCOUNTER — Encounter: Payer: Self-pay | Admitting: Student in an Organized Health Care Education/Training Program

## 2019-07-09 ENCOUNTER — Other Ambulatory Visit: Payer: Self-pay

## 2019-07-09 ENCOUNTER — Ambulatory Visit
Payer: PPO | Attending: Student in an Organized Health Care Education/Training Program | Admitting: Student in an Organized Health Care Education/Training Program

## 2019-07-09 ENCOUNTER — Encounter: Payer: Self-pay | Admitting: Student in an Organized Health Care Education/Training Program

## 2019-07-09 DIAGNOSIS — F1190 Opioid use, unspecified, uncomplicated: Secondary | ICD-10-CM | POA: Diagnosis not present

## 2019-07-09 DIAGNOSIS — Z720 Tobacco use: Secondary | ICD-10-CM | POA: Diagnosis not present

## 2019-07-09 DIAGNOSIS — M5417 Radiculopathy, lumbosacral region: Secondary | ICD-10-CM | POA: Diagnosis not present

## 2019-07-09 DIAGNOSIS — M961 Postlaminectomy syndrome, not elsewhere classified: Secondary | ICD-10-CM | POA: Diagnosis not present

## 2019-07-09 DIAGNOSIS — T85192S Other mechanical complication of implanted electronic neurostimulator (electrode) of spinal cord, sequela: Secondary | ICD-10-CM | POA: Diagnosis not present

## 2019-07-09 DIAGNOSIS — G894 Chronic pain syndrome: Secondary | ICD-10-CM

## 2019-07-09 DIAGNOSIS — M19072 Primary osteoarthritis, left ankle and foot: Secondary | ICD-10-CM

## 2019-07-09 DIAGNOSIS — M5386 Other specified dorsopathies, lumbar region: Secondary | ICD-10-CM | POA: Diagnosis not present

## 2019-07-09 MED ORDER — OXYCODONE-ACETAMINOPHEN 10-325 MG PO TABS: 1.0000 | ORAL_TABLET | Freq: Four times a day (QID) | ORAL | 0 refills | Status: DC | PRN

## 2019-07-09 NOTE — Progress Notes (Signed)
Patient: James Hood  Service Category: E/M  Provider: [name], MD  DOB: [birth year]  DOS: 07/09/2019  Location: Office  MRN: [id number]  Setting: Ambulatory outpatient  Referring Provider: Leone Haven, MD  Type: Established Patient  Specialty: Interventional Pain Management  PCP: Leone Haven, MD  Location: Home  Delivery: TeleHealth     Virtual Encounter - Pain Management PROVIDER NOTE: Information contained herein reflects review and annotations entered in association with encounter. Interpretation of such information and data should be left to medically-trained personnel. Information provided to patient can be located elsewhere in the medical record under "Patient Instructions". Document created using STT-dictation technology, any transcriptional errors that may result from process are unintentional.    Contact & Pharmacy Preferred: [phone] Home: [phone] (home) Mobile: [phone] (mobile) E-mail: [email or web address], [address] [place] [address] Phone: [phone] Fax: [phone]  [facility] [name], [address] [place] [address] Phone: [phone] Fax: [phone]  [place], [place] - [address] [place] [address] Phone: [phone] Fax: [phone]   Pre-screening  Mr. Borre offered "in-person" vs "virtual" encounter. He indicated preferring virtual for this encounter.   Reason COVID-19*  Social distancing based on CDC and AMA recommendations.   I contacted [name] on 07/09/2019 via telephone.      I clearly identified myself as [name], MD. I verified that I was speaking with the correct person using two identifiers (Name: MARK HASSEY, and date of birth: 16-Dec-1940).  This visit was  completed via telephone due to the restrictions of the COVID-19 pandemic. All issues as above were discussed and addressed but no physical exam was performed. If it was felt that the patient should be evaluated in the office, they were directed there. The patient verbally consented to this visit. Patient was unable to complete an audio/visual visit due to Technical difficulties and/or Lack of internet. Due to the catastrophic nature of the COVID-19 pandemic, this visit was done through audio contact only.  Location of the patient: home address (see Epic for details)  Location of the provider: office Consent I sought verbal advanced consent from [name] for virtual visit interactions. I informed Mr. Glasscock of possible security and privacy concerns, risks, and limitations associated with providing "not-in-person" medical evaluation and management services. I also informed Mr. Caltagirone of the availability of "in-person" appointments. Finally, I informed him that there would be a charge for the virtual visit and that he could be  personally, fully or partially, financially responsible for it. Mr. Stroschein expressed understanding and agreed to proceed.   Historic Elements   Mr. AIZIK REH is a 79 y.o. year old, male patient evaluated today after his last contact with our practice on 04/14/2019. Mr. 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 Mr. Mcree has a current medication list which includes the following prescription(s): aspirin ec, bupropion, diphenoxylate-atropine, divalproex, donepezil, fenofibrate, finasteride, gabapentin, icosapent ethyl, icosapent ethyl, jardiance, melatonin, memantine, memantine, metoprolol succinate, multiple vitamins-minerals, omeprazole, [START ON 07/30/2019] oxycodone-acetaminophen, [START ON 08/29/2019] oxycodone-acetaminophen, [START ON 09/28/2019] oxycodone-acetaminophen, primidone, quetiapine, rosuvastatin, ozempic (0.25 or 0.5 mg/dose), tamsulosin, vitamin d (ergocalciferol), and lisinopril-hydrochlorothiazide. He  reports that he has been smoking cigarettes. He has a 30.00 pack-year smoking history. He has never used smokeless tobacco. He reports previous alcohol use. He reports that he does not use drugs. Mr. Poehler has No Known Allergies.   HPI  Today, he is being contacted for medication management.   No change in medical history since last visit.  Endorsing left ankle pain, and worsening radicular pain. Had SCS removed due to IPG pocket infection. He states that he is not ready to proceed with re-implant although he does note that it was beneficial for his pain.  Patient continues multimodal pain regimen as prescribed.  States that it provides pain relief and improvement in functional status.  Pharmacotherapy Assessment  Analgesic: 06/30/2019  1   04/14/2019  Oxycodone-Acetaminophen 10-325  100.00  30 Bi Lat   [id number]   Har ([id number])   0  50.00 MME  Medicare   [place]    Monitoring: [place]  PMP: PDMP reviewed during this encounter.       Pharmacotherapy: No side-effects or adverse reactions reported. Compliance: No problems identified. Effectiveness: Clinically acceptable. Plan: Refer to "POC".  UDS:  Summary  Date Value Ref Range Status  04/15/2019 Note  Final    Comment:    ==================================================================== [employer] Select 13 (MW) ==================================================================== Test                             Result       Flag       Units Drug Present and Declared for Prescription Verification   Phenobarbital                  PRESENT      EXPECTED    Phenobarbital is an expected metabolite of primidone; Phenobarbital    may also be administered as a prescription drug. Drug Present not Declared for Prescription Verification   Alcohol, Ethyl                 0.024        UNEXPECTED g/dL    Sources of ethyl alcohol include alcoholic beverages or as a    fermentation product of glucose; glucose is present in this specimen.    Interpret result with caution, as the presence of ethyl alcohol is    likely due, at least in part, to fermentation of glucose.   Hydromorphone                  4719         UNEXPECTED ng/mg creat    Hydromorphone may be administered as a scheduled prescription    medication; it is also an expected metabolite of hydrocodone. Drug Absent but Declared for Prescription Verification   Oxycodone                      Not Detected UNEXPECTED ng/mg creat ====================================================================  Test                      Result    Flag   Units      Ref Range   Creatinine              78               mg/dL      >=20 ==================================================================== Declared Medications:  The flagging and interpretation on this report are based on the  following declared medications.  Unexpected results may arise from  inaccuracies in the declared  medications.  **Note: The testing scope of this panel includes these medications:  Oxycodone  Primidone  **Note: The testing scope of this panel does not include the  following reported medications:  Acetaminophen  Aspirin  Atropine (Lomotil)  Bupropion  Chlorhexidine  Diphenoxylate (Lomotil)  Empagliflozin  Fenofibrate  Finasteride  Gabapentin  Hydrochlorothiazide  Lisinopril  Melatonin  Memantine  Metoprolol  Multivitamin  Nicotine  Omeprazole  Quetiapine  Rosuvastatin  Semaglutide  Tamsulosin  Valproic Acid  Vitamin D2 (Ergocalciferol) ==================================================================== For clinical consultation, please call [phone]. ====================================================================    Laboratory Chemistry Profile   Renal Lab Results  Component Value Date   BUN 32 (H) 05/25/2019   CREATININE 1.20 05/25/2019   GFR 58.36 (L) 05/25/2019   GFRAA >60 08/15/2017   GFRNONAA >60 08/15/2017    Hepatic Lab Results  Component Value Date   AST 29 05/25/2019   ALT 22 05/25/2019   ALBUMIN 4.0 05/25/2019   ALKPHOS 50 05/25/2019   AMMONIA 14 08/09/2016    Electrolytes Lab Results  Component Value Date   NA 140 05/25/2019   K 3.8 05/25/2019   CL 100 05/25/2019   CALCIUM 9.2 05/25/2019    Bone Lab Results  Component Value Date   VD25OH 42.83 04/07/2019    Inflammation (CRP: Acute Phase) (ESR: Chronic Phase) Lab Results  Component Value Date   ESRSEDRATE 11 01/29/2017      Note: Above Lab results reviewed.  Imaging  US Renal CLINICAL DATA:  Chronic kidney disease stage 3.  EXAM: RENAL / URINARY TRACT ULTRASOUND COMPLETE  COMPARISON:  None.  FINDINGS: Right Kidney:  Renal measurements: 12.2 x 5.7 x 5.8 cm = volume: 210 mL. Parenchymal echogenicity is increased. No mass or hydronephrosis.  Left Kidney:  Renal measurements: 11.1 x 5.7 x 3.9 cm = volume: 127 mL. Parenchymal echogenicity is increased. No  mass or hydronephrosis.  Bladder:  Appears normal for degree of bladder distention.  Other:  None.  IMPRESSION: Increased parenchymal echogenicity is in keeping with chronic medical renal disease. No acute findings.  Electronically Signed   By: Lorin Picket M.D.   On: 04/01/2019 15:58  Assessment  The primary encounter diagnosis was Failed back surgical syndrome. 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  Plan of Care   [name] has a current medication list which includes the following long-term medication(s): bupropion, donepezil, fenofibrate, gabapentin, icosapent ethyl, icosapent ethyl, memantine, memantine, metoprolol succinate, omeprazole, primidone, quetiapine, rosuvastatin, and lisinopril-hydrochlorothiazide.   Pharmacotherapy (Medications Ordered): Meds ordered this encounter  Medications  . oxyCODONE-acetaminophen (PERCOCET) 10-325 MG tablet    Sig: Take 1 tablet by mouth every 6 (  six) hours as needed for pain. Must last 30 days. Max #100/month    Dispense:  100 tablet    Refill:  0    Chronic Pain. (STOP Act - Not applicable). Fill one day early if closed on scheduled refill date.  Marland Kitchen oxyCODONE-acetaminophen (PERCOCET) 10-325 MG tablet    Sig: Take 1 tablet by mouth every 6 (six) hours as needed for pain. Must last 30 days. Max #100/month    Dispense:  100 tablet    Refill:  0    Chronic Pain. (STOP Act - Not applicable). Fill one day early if closed on scheduled refill date.  Marland Kitchen oxyCODONE-acetaminophen (PERCOCET) 10-325 MG tablet    Sig: Take 1 tablet by mouth every 6 (six) hours as needed for pain. Must last 30 days. Max #100/month    Dispense:  100 tablet     Refill:  0    Chronic Pain. (STOP Act - Not applicable). Fill one day early if closed on scheduled refill date.   Continue Gabapentin as well.  Follow-up plan:   Return in about 3 months (around 10/06/2019) for Medication Management, in person.    Recent Visits Date Type Provider Dept  04/14/19 Office Visit [name], MD [facility]  Showing recent visits within past 90 days and meeting all other requirements   Today's Visits Date Type Provider Dept  07/09/19 Office Visit [name], MD [facility]  Showing today's visits and meeting all other requirements   Future Appointments No visits were found meeting these conditions.  Showing future appointments within next 90 days and meeting all other requirements   I discussed the assessment and treatment plan with the patient. The patient was provided an opportunity to ask questions and all were answered. The patient agreed with the plan and demonstrated an understanding of the instructions.  Patient advised to call back or seek an in-person evaluation if the symptoms or condition worsens.  Duration of encounter: 25 minutes.  Note by: [name], MD Date: 07/09/2019; Time: 8:56 AM

## 2019-07-10 ENCOUNTER — Other Ambulatory Visit: Payer: Self-pay | Admitting: Family Medicine

## 2019-07-10 DIAGNOSIS — E559 Vitamin D deficiency, unspecified: Secondary | ICD-10-CM

## 2019-07-20 ENCOUNTER — Ambulatory Visit (INDEPENDENT_AMBULATORY_CARE_PROVIDER_SITE_OTHER): Payer: PPO | Admitting: Family Medicine

## 2019-07-20 ENCOUNTER — Encounter: Payer: Self-pay | Admitting: Family Medicine

## 2019-07-20 ENCOUNTER — Other Ambulatory Visit: Payer: Self-pay

## 2019-07-20 VITALS — BP 140/70 | HR 81 | Temp 96.9°F | Ht 71.0 in | Wt 169.6 lb

## 2019-07-20 DIAGNOSIS — M25572 Pain in left ankle and joints of left foot: Secondary | ICD-10-CM

## 2019-07-20 DIAGNOSIS — R634 Abnormal weight loss: Secondary | ICD-10-CM | POA: Diagnosis not present

## 2019-07-20 DIAGNOSIS — M545 Low back pain, unspecified: Secondary | ICD-10-CM

## 2019-07-20 DIAGNOSIS — G8929 Other chronic pain: Secondary | ICD-10-CM | POA: Diagnosis not present

## 2019-07-20 DIAGNOSIS — Z720 Tobacco use: Secondary | ICD-10-CM | POA: Diagnosis not present

## 2019-07-20 DIAGNOSIS — Z125 Encounter for screening for malignant neoplasm of prostate: Secondary | ICD-10-CM | POA: Diagnosis not present

## 2019-07-20 LAB — COMPREHENSIVE METABOLIC PANEL
ALT: 32 U/L (ref 0–53)
AST: 42 U/L — ABNORMAL HIGH (ref 0–37)
Albumin: 3.6 g/dL (ref 3.5–5.2)
Alkaline Phosphatase: 53 U/L (ref 39–117)
BUN: 26 mg/dL — ABNORMAL HIGH (ref 6–23)
CO2: 31 mEq/L (ref 19–32)
Calcium: 9.4 mg/dL (ref 8.4–10.5)
Chloride: 100 mEq/L (ref 96–112)
Creatinine, Ser: 1.46 mg/dL (ref 0.40–1.50)
GFR: 46.52 mL/min — ABNORMAL LOW (ref >60.00)
Glucose, Bld: 118 mg/dL — ABNORMAL HIGH (ref 70–99)
Potassium: 4.4 mEq/L (ref 3.5–5.1)
Sodium: 139 mEq/L (ref 135–145)
Total Bilirubin: 0.3 mg/dL (ref 0.2–1.2)
Total Protein: 5.9 g/dL — ABNORMAL LOW (ref 6.0–8.3)

## 2019-07-20 LAB — CBC
HCT: 37.2 % — ABNORMAL LOW (ref 39.0–52.0)
Hemoglobin: 12.3 g/dL — ABNORMAL LOW (ref 13.0–17.0)
MCHC: 33 g/dL (ref 30.0–36.0)
MCV: 95.4 fl (ref 78.0–100.0)
Platelets: 208 10*3/uL (ref 150.0–400.0)
RBC: 3.9 Mil/uL — ABNORMAL LOW (ref 4.22–5.81)
RDW: 14 % (ref 11.5–15.5)
WBC: 6 10*3/uL (ref 4.0–10.5)

## 2019-07-20 LAB — TSH: TSH: 1.36 u[IU]/mL (ref 0.35–4.50)

## 2019-07-20 LAB — POCT URINALYSIS DIPSTICK
Bilirubin, UA: NEGATIVE
Blood, UA: NEGATIVE
Glucose, UA: POSITIVE — AB
Ketones, UA: NEGATIVE
Leukocytes, UA: NEGATIVE
Nitrite, UA: NEGATIVE
Protein, UA: NEGATIVE
Spec Grav, UA: 1.01 (ref 1.010–1.025)
Urobilinogen, UA: 0.2 E.U./dL
pH, UA: 5 (ref 5.0–8.0)

## 2019-07-20 LAB — SEDIMENTATION RATE: Sed Rate: 15 mm/hr (ref 0–20)

## 2019-07-20 LAB — PSA: PSA: 0.01 ng/mL — ABNORMAL LOW (ref 0.10–4.00)

## 2019-07-20 MED ORDER — MELOXICAM 7.5 MG PO TABS: 7.5000 mg | ORAL_TABLET | Freq: Every day | ORAL | 0 refills | Status: DC | PRN

## 2019-07-20 NOTE — Progress Notes (Signed)
 , MD Phone: [phone]  James Hood is a 78 y.o. male who presents today for follow-up.  Left ankle pain: Patient notes this is a chronic issue that has been going on for years.  It is a little worse recently.  No recent injuries.  He takes his chronic pain medicines for this.  Back pain with sciatica: Patient notes this has been worse for 2 to 3 months.  He had a spinal cord stimulator that did help though this became infected and had to be removed.  Gabapentin helps with his leg jumpiness.  He started to walk bent over.  No numbness.  General leg weakness though nothing focal.  No incontinence.  Weight loss: Patient notes since going on Ozempic he has not had very much appetite and does note some early satiety.  This has been going on for about 3 months and his weight has dropped from 183 pounds to 169 pounds.  No nausea, vomiting, blood in stool, night sweats, itching, urinary symptoms, fever, cough, chest pain, or anxiety.  He does report some intermittent diarrhea that is chronic and stable.  Occasional aching in the left side of his abdomen after starting the Ozempic though none now.  He does have some depression that may be a little worse though nothing terribly significant.  No SI.  No recently added new medications.  Social History   Tobacco Use  Smoking Status Current Every Day Smoker  . Packs/day: 0.50  . Years: 60.00  . Pack years: 30.00  . Types: Cigarettes  Smokeless Tobacco Never Used     ROS see history of present illness  Objective  Physical Exam Vitals:   07/20/19 0832  BP: 140/70  Pulse: 81  Temp: (!) 96.9 F (36.1 C)  SpO2: 96%    BP Readings from Last 3 Encounters:  07/20/19 140/70  03/10/19 112/74  02/27/19 130/80   Wt Readings from Last 3 Encounters:  07/20/19 169 lb 9.6 oz (76.9 kg)  05/25/19 175 lb (79.4 kg)  03/10/19 180 lb (81.6 kg)    Physical Exam Constitutional:      General: He is not in acute distress.  Appearance: He is not diaphoretic.  HENT:     Head: Normocephalic and atraumatic.  Cardiovascular:     Rate and Rhythm: Normal rate and regular rhythm.     Heart sounds: Normal heart sounds.  Pulmonary:     Effort: Pulmonary effort is normal.     Breath sounds: Normal breath sounds.  Abdominal:     General: Bowel sounds are normal. There is no distension.     Palpations: Abdomen is soft.     Tenderness: There is no abdominal tenderness. There is no guarding.  Musculoskeletal:     Right lower leg: No edema.     Left lower leg: No edema.  Lymphadenopathy:     Head:     Right side of head: No submental or submandibular adenopathy.     Left side of head: No submental or submandibular adenopathy.     Cervical: No cervical adenopathy.     Upper Body:     Right upper body: No axillary adenopathy.     Left upper body: No axillary adenopathy.     Lower Body: No right inguinal adenopathy. No left inguinal adenopathy.  Skin:    General: Skin is warm and dry.  Neurological:     Mental Status: He is alert.      Assessment/Plan: Please see individual problem list.    Weight loss I suspect this is related to his Ozempic though we will obtain lab work to rule out some other potential underlying causes.  If his lab work is unremarkable we will discuss decreasing his Ozempic dose.  Chronic lumbar pain Chronic issue.  We will trial meloxicam.  Chronic pain of left ankle Chronic issue.  Patient with degenerative changes on x-ray.  He will continue to follow with pain management.  Tobacco abuse Discussed lung cancer screening.  I will send a message to the RN at the cancer center to get him scheduled for this.   Orders Placed This Encounter  Procedures  . TSH  . Comp Met (CMET)  . CBC  . Sedimentation rate  . PSA  . POCT Urinalysis Dipstick    Standing Status:   Future    Number of Occurrences:   1    Standing Expiration Date:   08/20/2019    Meds ordered this encounter    Medications  . meloxicam (MOBIC) 7.5 MG tablet    Sig: Take 1 tablet (7.5 mg total) by mouth daily as needed for pain. Take with food    Dispense:  30 tablet    Refill:  0    This visit occurred during the SARS-CoV-2 public health emergency.  Safety protocols were in place, including screening questions prior to the visit, additional usage of staff PPE, and extensive cleaning of exam room while observing appropriate contact time as indicated for disinfecting solutions.     , MD [facility] [place]  

## 2019-07-20 NOTE — Patient Instructions (Signed)
Nice to see you. We will check lab work today for a cause of your weight loss.  I am going to check with our clinical pharmacist regarding your Ozempic as well. We are going to try meloxicam for your joint pain.  We will have you return in 2 weeks for kidney function check.  Please try to take this only if needed.  Please take this with food as there is risk of stomach irritation. Please discuss potential injections or other procedures with your pain specialist for your back.

## 2019-07-23 ENCOUNTER — Encounter: Payer: Self-pay | Admitting: Family Medicine

## 2019-07-23 DIAGNOSIS — R634 Abnormal weight loss: Secondary | ICD-10-CM | POA: Insufficient documentation

## 2019-07-23 NOTE — Assessment & Plan Note (Addendum)
Chronic issue.  We will trial meloxicam.

## 2019-07-23 NOTE — Assessment & Plan Note (Signed)
Discussed lung cancer screening.  I will send a message to the RN at the cancer center to get him scheduled for this.

## 2019-07-23 NOTE — Assessment & Plan Note (Signed)
I suspect this is related to his Ozempic though we will obtain lab work to rule out some other potential underlying causes.  If his lab work is unremarkable we will discuss decreasing his Ozempic dose.

## 2019-07-23 NOTE — Assessment & Plan Note (Signed)
Chronic issue.  Patient with degenerative changes on x-ray.  He will continue to follow with pain management.

## 2019-07-25 ENCOUNTER — Other Ambulatory Visit: Payer: Self-pay | Admitting: Family Medicine

## 2019-07-30 ENCOUNTER — Telehealth: Payer: Self-pay | Admitting: *Deleted

## 2019-07-30 DIAGNOSIS — Z87891 Personal history of nicotine dependence: Secondary | ICD-10-CM

## 2019-07-30 NOTE — Telephone Encounter (Signed)
Received referral for low dose lung cancer screening CT scan. Message left at phone number listed in EMR for patient to call me back to facilitate scheduling scan.  

## 2019-07-30 NOTE — Telephone Encounter (Signed)
Received referral for initial lung cancer screening scan. Contacted patient and obtained smoking history,(current, 30 pack year) as well as answering questions related to screening process. Patient denies signs of lung cancer such as weight loss or hemoptysis. Patient denies comorbidity that would prevent curative treatment if lung cancer were found. Patient is scheduled for shared decision making visit and CT scan on 08/04/19 at 930qam.

## 2019-07-31 ENCOUNTER — Telehealth: Payer: Self-pay | Admitting: Family Medicine

## 2019-07-31 NOTE — Telephone Encounter (Signed)
Pt called in and said he was returning your call. I will assume it is lab results?

## 2019-07-31 NOTE — Telephone Encounter (Signed)
LV for the patient to call back I wanted to inform him that we could give him samples of Ozempic until his comes in.  Reyann Troop,cma

## 2019-08-03 NOTE — Telephone Encounter (Signed)
I called and spoke with the patient and informed him that we had samples for him to pick up if he needed the Ozempic until his arrives, patient states he has enough to last.  Alys Dulak,cma

## 2019-08-04 ENCOUNTER — Other Ambulatory Visit: Payer: Self-pay

## 2019-08-04 ENCOUNTER — Ambulatory Visit
Admission: RE | Admit: 2019-08-04 | Discharge: 2019-08-04 | Disposition: A | Discharge status: Home / Self Care | Payer: PPO | Source: Ambulatory Visit | Attending: Nurse Practitioner | Admitting: Nurse Practitioner

## 2019-08-04 ENCOUNTER — Inpatient Hospital Stay: Payer: PPO | Attending: Nurse Practitioner | Admitting: Oncology

## 2019-08-04 DIAGNOSIS — Z87891 Personal history of nicotine dependence: Secondary | ICD-10-CM | POA: Diagnosis not present

## 2019-08-04 DIAGNOSIS — F17210 Nicotine dependence, cigarettes, uncomplicated: Secondary | ICD-10-CM | POA: Diagnosis not present

## 2019-08-04 NOTE — Progress Notes (Signed)
Virtual Visit via Video Note  I connected with  on 08/04/19 at  9:30 AM EDT by a video enabled telemedicine application and verified that I am speaking with the correct person using two identifiers.  Location: Patient: Home Provider: Office   I discussed the limitations of evaluation and management by telemedicine and the availability of in person appointments. The patient expressed understanding and agreed to proceed.  I discussed the assessment and treatment plan with the patient. The patient was provided an opportunity to ask questions and all were answered. The patient agreed with the plan and demonstrated an understanding of the instructions.   The patient was advised to call back or seek an in-person evaluation if the symptoms worsen or if the condition fails to improve as anticipated.   In accordance with CMS guidelines, patient has met eligibility criteria including age, absence of signs or symptoms of lung cancer.  Social History   Tobacco Use  . Smoking status: Current Every Day Smoker    Packs/day: 0.50    Years: 60.00    Pack years: 30.00    Types: Cigarettes  . Smokeless tobacco: Never Used  Substance Use Topics  . 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 Drug use: Never      A shared decision-making session was conducted prior to the performance of CT scan. This includes one or more decision aids, includes benefits and harms of screening, follow-up diagnostic testing, over-diagnosis, false positive rate, and total radiation exposure.   Counseling on the importance of adherence to annual lung cancer LDCT screening, impact of co-morbidities, and ability or willingness to undergo diagnosis and treatment is imperative for compliance of the program.   Counseling on the importance of continued smoking cessation for former smokers; the importance of smoking cessation for current smokers, and information  about tobacco cessation interventions have been given to patient including [place] and [facility] quit [place] programs.   Written order for lung cancer screening with LDCT has been given to the patient and any and all questions have been answered to the best of my abilities.    Yearly follow up will be coordinated by Burgess Estelle, Thoracic Navigator.  I provided 15 minutes of face-to-face video visit time during this encounter, and > 50% was spent counseling as documented under my assessment & plan.   Jacquelin Hawking, NP

## 2019-08-05 ENCOUNTER — Other Ambulatory Visit (INDEPENDENT_AMBULATORY_CARE_PROVIDER_SITE_OTHER): Payer: PPO

## 2019-08-05 ENCOUNTER — Telehealth: Payer: Self-pay | Admitting: *Deleted

## 2019-08-05 ENCOUNTER — Other Ambulatory Visit: Payer: Self-pay

## 2019-08-05 ENCOUNTER — Other Ambulatory Visit: Payer: Self-pay | Admitting: Family Medicine

## 2019-08-05 DIAGNOSIS — R945 Abnormal results of liver function studies: Secondary | ICD-10-CM

## 2019-08-05 DIAGNOSIS — R7989 Other specified abnormal findings of blood chemistry: Secondary | ICD-10-CM

## 2019-08-05 LAB — HEPATIC FUNCTION PANEL
ALT: 27 U/L (ref 0–53)
AST: 33 U/L (ref 0–37)
Albumin: 3.6 g/dL (ref 3.5–5.2)
Alkaline Phosphatase: 48 U/L (ref 39–117)
Bilirubin, Direct: 0.1 mg/dL (ref 0.0–0.3)
Total Bilirubin: 0.4 mg/dL (ref 0.2–1.2)
Total Protein: 5.8 g/dL — ABNORMAL LOW (ref 6.0–8.3)

## 2019-08-05 NOTE — Telephone Encounter (Signed)
Pt coming in today for labs. Please place future lab orders

## 2019-08-05 NOTE — Telephone Encounter (Signed)
Order placed

## 2019-08-06 ENCOUNTER — Other Ambulatory Visit: Payer: PPO

## 2019-08-06 ENCOUNTER — Encounter: Payer: Self-pay | Admitting: *Deleted

## 2019-08-06 DIAGNOSIS — K52832 Lymphocytic colitis: Secondary | ICD-10-CM | POA: Diagnosis not present

## 2019-08-06 DIAGNOSIS — K2270 Barrett's esophagus without dysplasia: Secondary | ICD-10-CM | POA: Diagnosis not present

## 2019-08-06 DIAGNOSIS — K529 Noninfective gastroenteritis and colitis, unspecified: Secondary | ICD-10-CM | POA: Diagnosis not present

## 2019-08-10 ENCOUNTER — Encounter: Payer: Self-pay | Admitting: Pharmacist

## 2019-08-10 ENCOUNTER — Ambulatory Visit (INDEPENDENT_AMBULATORY_CARE_PROVIDER_SITE_OTHER): Payer: PPO | Admitting: Pharmacist

## 2019-08-10 DIAGNOSIS — M545 Low back pain, unspecified: Secondary | ICD-10-CM

## 2019-08-10 DIAGNOSIS — G629 Polyneuropathy, unspecified: Secondary | ICD-10-CM

## 2019-08-10 DIAGNOSIS — G8929 Other chronic pain: Secondary | ICD-10-CM

## 2019-08-10 DIAGNOSIS — F419 Anxiety disorder, unspecified: Secondary | ICD-10-CM

## 2019-08-10 DIAGNOSIS — F329 Major depressive disorder, single episode, unspecified: Secondary | ICD-10-CM | POA: Diagnosis not present

## 2019-08-10 DIAGNOSIS — F32A Depression, unspecified: Secondary | ICD-10-CM

## 2019-08-10 DIAGNOSIS — E1142 Type 2 diabetes mellitus with diabetic polyneuropathy: Secondary | ICD-10-CM

## 2019-08-10 DIAGNOSIS — Z720 Tobacco use: Secondary | ICD-10-CM

## 2019-08-10 NOTE — Patient Instructions (Signed)
Visit Information  Goals Addressed            This Visit's Progress     Patient Stated   . "My blood sugars are too high" (pt-stated)       Current Barriers:  . Diabetes: uncontrolled, most recent A1c 7.7% o Does note some increase in appetite since decreasing Ozempic dose, but notes that he is not back to "baseline" . Current antihyperglycemic regimen: Jardiance 10 mg daily, Ozempic 0.5 mg - reduced ~2 weeks ago; currently using his 1 mg pen and reducing from 1 mg by 10 clicks o APPROVED for Ozempic assistance through 05/20/20 o Over income for [place] assistance . Current blood glucose readings: reports he has NOT been checking recently; notes he has been "cheating" with sweets lately . Cardiovascular risk reduction: o Antihypertensive regimen: lisinopril/HCTZ 20/25 mg daily, metoprolol succinate 25 mg daily o Antihyperlipemic regimen: rosuvastatin 40 mg daily, fenofibrate 145 mg daily, Vascepa 2 g BID; due for updated lipid panel w/ upcoming PCP appt  - Received [employer] funding for 2021 for Vascepa cost o Antiplatelet regimen: ASA 81 mg  . [place] w/ Neurology Dr. 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 however, reports he is drinking mostly Diet Mt Dew during the day. He also lays in bed and watches TV for 1-2 hours beforebd . Chronic pain: Dr. Consuela Mimes pain management; oxycodone 5/325 mg TID, sometimes QID. Also takes gabapentin 1200 mg TID, sometimes takes an extra fourth dose. Notes he uses meloxicam 7.5 mg PRN, as he doesn't feel it helps much. Notes that pain today is mostly nerve/sciatic . 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 Today reports that he has no desire to quit smoking.  Recent screening CT was negative for any abnormalities   Pharmacist Clinical Goal(s):  Marland Kitchen Over the next 90 days, patient will work with PharmD and primary care provider to address optimized glycemic management  Interventions: . Comprehensive medication review performed, medication list updated in electronic medical record.  . Reiterated the importance of BG monitoring to determine next steps of therapy. He noted that he would check between now and Dr. Ellen Henri next appointment to help determine next steps of therapy.  Marland Kitchen Pending A1c, could consider increasing Jardiance to 25 mg daily, if A1c not at goal on reduced Ozempic dose . Provided counseling on tobacco cessation. Patient is not interested at this time. . Reviewed current nerve pain. Denies having tried duloxetine before. Notes that his mood is fairly well controlled right now, but clearly is not benefiting from tobacco cessation benefit of bupropion. Bupropion is additionally a strong 2D6 inhibitor, which would increase duloxetine concentrations and risk of serotonin syndrome with concurrent quetiapine. Encouraged to discuss w/ Dr. Consuela Mimes at upcoming appointment. Patient requested that I send him the name of this medication in MyChart message.  . Reviewed medication interactions. As a strong CYP3A4 inducer, primidone increases clearance of quetiapine and tamsulosin. I do not recommend any dose changes at this time, but primidone's pharmacodynamic properties will need to be kept in mind with future medication changes.   Patient Self Care Activities:  . Patient will check blood glucose BID , document, and provide at future appointments . Patient will take  medications as prescribed . Patient will report any questions or concerns to provider   Please see past updates related to this goal by clicking on the "Past Updates" button in the selected goal         Patient verbalizes understanding of instructions provided today.    Plan:  -  Scheduled f/u call 10/12/19  Catie Darnelle Maffucci, PharmD, BCACP, CPP Clinical Pharmacist [place] [phone]

## 2019-08-10 NOTE — Chronic Care Management (AMB) (Addendum)
Chronic Care Management   Follow Up Note   08/10/2019 Name: James Hood MRN: [id number] DOB: 01/08/1941  Referred by: James Hood Reason for referral : Chronic Care Management (Medication Management)   James Hood is a 79 y.o. year old male who is a primary care patient of James Hood, James Adam, Hood. The CCM team was consulted for assistance with chronic disease management and care coordination needs.    Contacted patient for medication management review.  Review of patient status, including review of consultants reports, relevant laboratory and other test results, and collaboration with appropriate care team members and the patient's provider was performed as part of comprehensive patient evaluation and provision of chronic care management services.    SDOH (Social Determinants of Health) assessments performed: Yes See Care Plan activities for detailed interventions related to James Hood)     Outpatient Encounter Medications as of 08/10/2019  Medication Sig Note  . aspirin EC 81 MG tablet Take 81 mg by mouth daily.   . budesonide (ENTOCORT EC) 3 MG 24 hr capsule Take 9 mg by mouth daily.   Marland Kitchen buPROPion (WELLBUTRIN SR) 150 MG 12 hr tablet TAKE ONE TABLET BY MOUTH TWICE A DAY   . divalproex (DEPAKOTE ER) 500 MG 24 hr tablet Take 1 tablet by mouth 2 (two) times daily.   Marland Kitchen donepezil (ARICEPT) 10 MG tablet Take 10 mg by mouth at bedtime.   . fenofibrate (TRICOR) 145 MG tablet Take 1 tablet (145 mg total) by mouth daily.   . finasteride (PROSCAR) 5 MG tablet TAKE ONE TABLET BY MOUTH DAILY   . gabapentin (NEURONTIN) 600 MG tablet TAKE TWO TABLETS BY MOUTH THREE TIMES A DAY   . icosapent Ethyl (VASCEPA) 1 g capsule Take 2 capsules (2 g total) by mouth 2 (two) times daily.   Marland Kitchen JARDIANCE 10 MG TABS tablet TAKE ONE TABLET BY MOUTH DAILY BEFORE BREAKFAST   . lisinopril-hydrochlorothiazide (ZESTORETIC) 20-25 MG tablet TAKE ONE TABLET BY MOUTH TWICE A DAY   . meloxicam (MOBIC) 7.5 MG  tablet Take 1 tablet (7.5 mg total) by mouth daily as needed for pain. Take with food 08/10/2019: PRN   . memantine (NAMENDA) 10 MG tablet TAKE ONE TABLET BY MOUTH TWICE A DAY   . metoprolol succinate (TOPROL-XL) 25 MG 24 hr tablet Take 25 mg by mouth at bedtime.    . Multiple Vitamins-Minerals (CENTRUM SILVER PO) Take 1 tablet by mouth daily.   Marland Kitchen omeprazole (PRILOSEC) 20 MG capsule Take 20 mg by mouth 2 (two) times daily before a meal.    . oxyCODONE-acetaminophen (PERCOCET) 10-325 MG tablet Take 1 tablet by mouth every 6 (six) hours as needed for pain. Must last 30 days. Max #100/month   . primidone (MYSOLINE) 50 MG tablet Take 150-250 mg by mouth 2 (two) times daily. Take 250 mg by mouth in the morning & take 150 mg at night.   . QUEtiapine (SEROQUEL) 25 MG tablet Take 125 mg by mouth at bedtime.    . rosuvastatin (CRESTOR) 40 MG tablet TAKE ONE TABLET BY MOUTH DAILY   . Semaglutide,0.25 or 0.5MG /DOS, (OZEMPIC, 0.25 OR 0.5 MG/DOSE,) 2 MG/1.5ML SOPN Inject 1 mg into the skin once a week.   . tamsulosin (FLOMAX) 0.4 MG CAPS capsule TAKE ONE CAPSULE BY MOUTH DAILY AFTER SUPPER   . Vitamin D, Ergocalciferol, (DRISDOL) 1.25 MG (50000 UT) CAPS capsule TAKE 1 CAPSULE BY MOUTH EVERY 7 DAYS   . [DISCONTINUED] Melatonin 10 MG TABS Take 20  mg by mouth at bedtime.   . diphenoxylate-atropine (LOMOTIL) 2.5-0.025 MG tablet Take 1 tablet by mouth 2 (two) times daily.    Derrill Memo ON 08/29/2019] oxyCODONE-acetaminophen (PERCOCET) 10-325 MG tablet Take 1 tablet by mouth every 6 (six) hours as needed for pain. Must last 30 days. Max #100/month   . [START ON 09/28/2019] oxyCODONE-acetaminophen (PERCOCET) 10-325 MG tablet Take 1 tablet by mouth every 6 (six) hours as needed for pain. Must last 30 days. Max #100/month   . [DISCONTINUED] icosapent Ethyl (VASCEPA) 1 g capsule Take 2 g by mouth 2 (two) times daily.   . [DISCONTINUED] memantine (NAMENDA) 10 MG tablet Take 10 mg by mouth 2 (two) times daily.     No  facility-administered encounter medications on file as of 08/10/2019.     Objective:   Goals Addressed            This Visit's Progress     Patient Stated   . "My blood sugars are too high" (pt-stated)       Current Barriers:  . Diabetes: uncontrolled, most recent A1c 7.7% o Does note some increase in appetite since decreasing Ozempic dose, but notes that he is not back to "baseline" . Current antihyperglycemic regimen: Jardiance 10 mg daily, Ozempic 0.5 mg - reduced ~2 weeks ago; currently using his 1 mg pen and reducing from 1 mg by 10 clicks o APPROVED for Ozempic assistance through 05/20/20 o Over income for [place] assistance . Current blood glucose readings: reports he has NOT been checking recently; notes he has been "cheating" with sweets lately . Cardiovascular risk reduction: o Antihypertensive regimen: lisinopril/HCTZ 20/25 mg daily, metoprolol succinate 25 mg daily o Antihyperlipemic regimen: rosuvastatin 40 mg daily, fenofibrate 145 mg daily, Vascepa 2 g BID; due for updated lipid panel Hood/ upcoming PCP appt  - Received [employer] funding for 2021 for Vascepa cost o Antiplatelet regimen: ASA 81 mg  . James Hood/ Neurology James Hood for multiple conditions: o Pseudo dementia: followed by James Hood; Namenda 10 mg daily, donepezil 10 mg daily o Tremor: primidone 250 mg QAM, 150 mg QPM; gabapentin 1200 mg TID o Sleep disorder: depakote 500 mg BID, quetiapine 125 mg QPM; Reports having more trouble staying asleep lately, he will fall asleep, but wake up a few hours later. however, reports he is drinking mostly Diet Mt Dew during the day. He also lays in bed and watches TV for 1-2 hours beforebd . Chronic pain: James Hood pain management; oxycodone 5/325 mg TID, sometimes QID. Also takes gabapentin 1200 mg TID, sometimes takes an extra fourth dose. Notes he uses meloxicam 7.5 mg PRN, as he doesn't feel it helps much. Notes that pain today is mostly nerve/sciatic . Tobacco  abuse: 1/2 ppd. Today reports that he has no desire to quit smoking. Recent screening CT was negative for any abnormalities   Pharmacist Clinical Goal(s):  Marland Kitchen Over the next 90 days, patient will work with PharmD and primary care provider to address optimized glycemic management  Interventions: . Comprehensive medication review performed, medication list updated in electronic medical record.  . Reiterated the importance of BG monitoring to determine next steps of therapy. He noted that he would check between now and Dr. Ellen Henri next appointment to help determine next steps of therapy.  Marland Kitchen Pending A1c, could consider increasing Jardiance to 25 mg daily, if A1c not at goal on reduced Ozempic dose . Provided counseling on tobacco cessation. Patient is not interested at this time. . Reviewed current  nerve pain. Denies having tried duloxetine before. Notes that his mood is fairly well controlled right now, but clearly is not benefiting from tobacco cessation benefit of bupropion. Bupropion is additionally a strong 2D6 inhibitor, which would increase duloxetine concentrations and risk of serotonin syndrome with concurrent quetiapine. Encouraged to discuss Hood/ James Hood at upcoming appointment. Patient requested that I send him the name of this medication in MyChart message.  . Reviewed medication interactions. As a strong CYP3A4 inducer, primidone increases clearance of quetiapine and tamsulosin. I do not recommend any dose changes at this time, but primidone's pharmacodynamic properties will need to be kept in mind with future medication changes.   Patient Self Care Activities:  . Patient will check blood glucose BID , document, and provide at future appointments . Patient will take medications as prescribed . Patient will report any questions or concerns to provider   Please see past updates related to this goal by clicking on the "Past Updates" button in the selected goal          Plan:  -  Scheduled f/u call 10/12/19  Catie Darnelle Maffucci, PharmD, BCACP, [place] Pharmacist [place] [phone]

## 2019-08-18 ENCOUNTER — Other Ambulatory Visit: Payer: Self-pay | Admitting: Family Medicine

## 2019-08-19 ENCOUNTER — Other Ambulatory Visit: Payer: Self-pay

## 2019-08-24 ENCOUNTER — Other Ambulatory Visit: Payer: Self-pay

## 2019-08-24 ENCOUNTER — Encounter: Payer: Self-pay | Admitting: Family Medicine

## 2019-08-24 ENCOUNTER — Ambulatory Visit (INDEPENDENT_AMBULATORY_CARE_PROVIDER_SITE_OTHER): Payer: PPO | Admitting: Family Medicine

## 2019-08-24 VITALS — BP 110/60 | HR 74 | Temp 95.2°F | Ht 71.0 in | Wt 166.0 lb

## 2019-08-24 DIAGNOSIS — R634 Abnormal weight loss: Secondary | ICD-10-CM | POA: Diagnosis not present

## 2019-08-24 DIAGNOSIS — G479 Sleep disorder, unspecified: Secondary | ICD-10-CM | POA: Insufficient documentation

## 2019-08-24 DIAGNOSIS — K22719 Barrett's esophagus with dysplasia, unspecified: Secondary | ICD-10-CM | POA: Diagnosis not present

## 2019-08-24 DIAGNOSIS — G8929 Other chronic pain: Secondary | ICD-10-CM | POA: Diagnosis not present

## 2019-08-24 DIAGNOSIS — E1142 Type 2 diabetes mellitus with diabetic polyneuropathy: Secondary | ICD-10-CM | POA: Diagnosis not present

## 2019-08-24 DIAGNOSIS — M25572 Pain in left ankle and joints of left foot: Secondary | ICD-10-CM

## 2019-08-24 DIAGNOSIS — R066 Hiccough: Secondary | ICD-10-CM | POA: Diagnosis not present

## 2019-08-24 NOTE — Assessment & Plan Note (Signed)
Weight has increased 1 pound.  Appetite has improved since decreasing the dose of Ozempic.  I suspect his weight loss and decreased appetite were related to the Ozempic.  We will monitor for now and recheck in 2 months.  Other work-up has been unremarkable for cause.

## 2019-08-24 NOTE — Patient Instructions (Addendum)
Nice to see you. Please cut out TV and caffeine as we discussed.  Please try to eat a larger amount of protein for dinner to see if that helps you stay full longer. It appears you will be due for your EGD in August 2021. We will continue your current dose of ozempic. We will refer you to orthopedics as well.

## 2019-08-24 NOTE — Progress Notes (Signed)
Tommi Rumps, MD Phone: [phone]  MANUELITO BEEGHLY is a 79 y.o. male who presents today for f/u.  DIABETES Disease Monitoring: Blood Sugar ranges-117-146 polyuria/phagia/dipsia-no     Medications: Compliance-taking Ozempic and Jardiance  Weight loss: Weight has stabilized.  He is actually up a pound.  His appetite is a little bit better with the decreased dose of Ozempic.  No early satiety.  No nausea, vomiting, or abdominal pain.  He is just eating a smaller quantity of food.  Still eating 3-4 meals a day.  Typically microwave meals with an occasional salad.  Sleep difficulty: Patient tries to go to bed around 9 PM.  He watches TV for about an hour and takes his Seroquel at that time.  He will doze off around 10 PM and then turns the lights off.  He sleeps for 2 hours and then gets hungry and wakes up and eats some vienna sausages and watches TV.  He will go back to sleep around 1 AM.  He will sleep for several hours and then wake up and have to go to the bathroom.  He notes he used to read before bed and had no trouble sleeping until he started to watch TV before bed.  He also drinks caffeinated beverages up until bedtime.  No alcohol.  Hiccups: Patient notes he has been having hiccups right after he eats.  Typically occurs almost every time.  No dysphagia.  Does have a history of Barrett's esophagus and had an EGD in August 2020.  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  He does not want any more procedures for this.  He has chronic left ankle pain that he has seen a podiatrist for and has had several injections with little benefit.  Social History   Tobacco Use  Smoking Status Current Every Day Smoker  . Packs/day: 0.50  . Years: 60.00  . Pack years: 30.00  . Types: Cigarettes  Smokeless Tobacco Never Used     ROS see history of present illness  Objective  Physical Exam Vitals:   08/24/19 0843  BP: 110/60   Pulse: 74  Temp: (!) 95.2 F (35.1 C)  SpO2: 96%    BP Readings from Last 3 Encounters:  08/24/19 110/60  07/20/19 140/70  03/10/19 112/74   Wt Readings from Last 3 Encounters:  08/24/19 166 lb (75.3 kg)  08/04/19 165 lb (74.8 kg)  07/20/19 169 lb 9.6 oz (76.9 kg)    Physical Exam Constitutional:      General: He is not in acute distress.    Appearance: He is not diaphoretic.  Cardiovascular:     Rate and Rhythm: Normal rate and regular rhythm.     Heart sounds: Normal heart sounds.  Pulmonary:     Effort: Pulmonary effort is normal.     Breath sounds: Normal breath sounds.  Abdominal:     General: Bowel sounds are normal. There is no distension.     Palpations: Abdomen is soft.     Tenderness: There is no abdominal tenderness. There is no guarding or rebound.  Musculoskeletal:     Comments: Left ankle with no bony tenderness or swelling  Skin:    General: Skin is warm and dry.  Neurological:     Mental Status: He is alert.      Assessment/Plan: Please see individual problem list.  Barrett's esophagus Discussed that he would be due for repeat EGD in August 2021.  Type 2 diabetes mellitus ([place]) Still well controlled with decreased dose of Ozempic.  He will continue his Ozempic and Jardiance.  Chronic pain of left ankle Refer to orthopedics.  Weight loss Weight has increased 1 pound.  Appetite has improved since decreasing the dose of Ozempic.  I suspect his weight loss and decreased appetite were related to the Ozempic.  We will monitor for now and recheck in 2 months.  Other work-up has been unremarkable for cause.  Sleeping difficulty I suspect this is related to caffeine use and watching TV.  Discussed sleep hygiene measures.  Follow-up in 2 months.  Hiccups Occurring after eating.  Prior EGD stable.  Will monitor.   Orders Placed This Encounter  Procedures  . Ambulatory referral to Orthopedic Surgery    Referral Priority:   Routine    Referral  Type:   Surgical    Referral Reason:   Specialty Services Required    Requested Specialty:   Orthopedic Surgery    Number of Visits Requested:   1    No orders of the defined types were placed in this encounter.   This visit occurred during the SARS-CoV-2 public health emergency.  Safety protocols were in place, including screening questions prior to the visit, additional usage of staff PPE, and extensive cleaning of exam room while observing appropriate contact time as indicated for disinfecting solutions.    Tommi Rumps, MD [place]

## 2019-08-24 NOTE — Assessment & Plan Note (Signed)
Refer to orthopedics 

## 2019-08-24 NOTE — Assessment & Plan Note (Signed)
Discussed that he would be due for repeat EGD in August 2021.

## 2019-08-24 NOTE — Assessment & Plan Note (Signed)
Occurring after eating.  Prior EGD stable.  Will monitor.

## 2019-08-24 NOTE — Assessment & Plan Note (Signed)
I suspect this is related to caffeine use and watching TV.  Discussed sleep hygiene measures.  Follow-up in 2 months.

## 2019-08-24 NOTE — Assessment & Plan Note (Signed)
Still well controlled with decreased dose of Ozempic.  He will continue his Ozempic and Jardiance.

## 2019-08-25 ENCOUNTER — Telehealth: Payer: Self-pay | Admitting: Family Medicine

## 2019-08-25 NOTE — Telephone Encounter (Signed)
[facility] called they went to see patient today he reported that he had 2 falls within the last 2 weeks. BP today was 99/64 oxygen 96 pluse 70

## 2019-08-26 NOTE — Telephone Encounter (Signed)
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  James Hood,cma

## 2019-08-26 NOTE — Telephone Encounter (Signed)
[facility] called they went to see patient today he reported that he had 2 falls within the last 2 weeks. BP today was 99/64 oxygen 96 pluse 70 James Hood,cma

## 2019-08-26 NOTE — Telephone Encounter (Signed)
Noted. Can you call the patient and get details on his falls? I do not believe he mentioned this when he was here Monday. How did he fall? Did he injure himself?

## 2019-08-26 NOTE — Telephone Encounter (Signed)
Noted  

## 2019-08-31 ENCOUNTER — Ambulatory Visit: Payer: PPO | Admitting: Orthopedic Surgery

## 2019-08-31 ENCOUNTER — Other Ambulatory Visit: Payer: Self-pay

## 2019-08-31 ENCOUNTER — Encounter: Payer: Self-pay | Admitting: Orthopedic Surgery

## 2019-08-31 VITALS — Ht 71.0 in | Wt 166.0 lb

## 2019-08-31 DIAGNOSIS — M25872 Other specified joint disorders, left ankle and foot: Secondary | ICD-10-CM

## 2019-08-31 NOTE — Progress Notes (Signed)
Office Visit Note   Patient: James Hood           Date of Birth: 1940-08-10           MRN: [id number] Visit Date: 08/31/2019              Requested by: Leone Haven, MD [address] [place],  [place] [address] PCP: Leone Haven, MD  Chief Complaint  Patient presents with  . Left Ankle - Pain      HPI: Patient is a 79 year old gentleman who presents with chronic impingement symptoms of the left ankle.  Patient states he has pain over the anterior lateral joint line which radiates proximally.  Patient states he has decreased range of motion pain with activities of daily living he states he has had steroid injections x2 with podiatry with only about 1 week of relief from each injection.  Patient is status post radiographs in 2020 and an MRI scan in 2019.  Assessment & Plan: Visit Diagnoses:  1. Impingement syndrome of left ankle     Plan: Patient symptoms are consistent with impingement syndrome.  Discussed that there is approximately 80% chance that we should improve his symptoms with arthroscopy.  Discussed that there is a 1% risk of complications including infection neurovascular injury persistent pain need for additional surgery.  Patient states he understands wished to proceed at this time.  We will set this up as outpatient surgery at [facility] day surgery.  Follow-Up Instructions: Return in about 2 weeks (around 09/14/2019).   Ortho Exam  Patient is alert, oriented, no adenopathy, well-dressed, normal affect, normal respiratory effort. Examination patient has a good dorsalis pedis and posterior tibial pulse he has good ankle good subtalar motion with dorsiflexion of the ankle he reproduces his pain over the anterior lateral joint line there is no pain to palpation over the sinus Tarsi the peroneal and posterior tibial tendons are nontender to palpation he has directly tender to palpation over the anterior joint line.  Review of the radiographs shows no  bony abnormalities.  Review of the MRI scan shows no osteochondral defect and no bony abnormalities.  Patient's most recent hemoglobin A1c is 7.7.  Imaging: No results found. No images are attached to the encounter.  Labs: Lab Results  Component Value Date   HGBA1C 7.7 (H) 05/25/2019   HGBA1C 8.5 (H) 02/18/2019   HGBA1C 7.2 (H) 10/30/2018   ESRSEDRATE 15 07/20/2019   ESRSEDRATE 11 01/29/2017   ESRSEDRATE 30 (H) 06/19/2016   REPTSTATUS 04/07/2018 FINAL 04/02/2018   GRAMSTAIN NO WBC SEEN NO ORGANISMS SEEN  04/02/2018   CULT  04/02/2018    FEW PROTEUS MIRABILIS NO ANAEROBES ISOLATED Performed at [facility], [place] [address], [place], [place] [address]    LABORGA PROTEUS MIRABILIS 04/02/2018     Lab Results  Component Value Date   ALBUMIN 3.6 08/05/2019   ALBUMIN 3.6 07/20/2019   ALBUMIN 4.0 05/25/2019    No results found for: MG Lab Results  Component Value Date   VD25OH 42.83 04/07/2019   VD25OH 24.06 (L) 03/13/2019   VD25OH 13.94 (L) 02/02/2019    No results found for: PREALBUMIN CBC EXTENDED Latest Ref Rng & Units 07/20/2019 02/02/2019 04/23/2018  WBC 4.0 - 10.5 K/uL 6.0 6.6 6.6  RBC 4.22 - 5.81 Mil/uL 3.90(L) 3.59(L) 3.35(L)  HGB 13.0 - 17.0 g/dL 12.3(L) 11.3(L) 10.5(L)  HCT 39.0 - 52.0 % 37.2(L) 33.5(L) 30.2(L)  PLT 150.0 - 400.0 K/uL 208.0  258.0 313  NEUTROABS 1.4 - 7.7 K/uL - 4.1 -  LYMPHSABS 0.7 - 4.0 K/uL - 2.0 -     Body mass index is 23.15 kg/m.  Orders:  No orders of the defined types were placed in this encounter.  No orders of the defined types were placed in this encounter.    Procedures: No procedures performed  Clinical Data: No additional findings.  ROS:  All other systems negative, except as noted in the HPI. Review of Systems  Objective: Vital Signs: Ht 5\' 11"  (1.803 m)   Wt 166 lb (75.3 kg)   BMI 23.15 kg/m   Specialty Comments:  No specialty comments available.  PMFS History: Patient Active Problem List    Diagnosis Date Noted  . Sleeping difficulty 08/24/2019  . Hiccups 08/24/2019  . Weight loss 07/23/2019  . Chronic pain of left ankle 02/27/2019  . Vitamin D deficiency 02/18/2019  . Left shoulder pain 02/02/2019  . Neuropathy 09/24/2018  . Lumbosacral radiculopathy 01/14/2018  . 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 Facet arthritis of lumbar region 01/14/2018  . Chronic pain syndrome 08/26/2017  . 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 Thyroid nodule 02/27/2017  . Fatty liver 02/27/2017  . Purpura ([place]) 01/29/2017  . Right hip pain 08/16/2016  . Lumbar herniated disc 02/03/2016  . Hypertriglyceridemia 12/09/2015  . Anemia 12/09/2015  . Falls 09/16/2015  . Barrett's esophagus 09/16/2015  . Chronic lumbar pain 09/16/2015  . Benign prostatic hyperplasia 09/16/2015  . Trochanteric bursitis of left hip 09/09/2015  . Headache 09/09/2015  . Difficulty in walking 06/01/2015  . Amnesia 06/01/2015  . Benign essential tremor 03/10/2015  . HLD (hyperlipidemia) 02/23/2015  . DJD (degenerative joint disease) of knee 11/15/2014  . Hypertension   . Tremor, essential   . Primary localized osteoarthritis of right knee   . Anxiety and depression   . Absence of sensation 11/18/2013  . Tobacco abuse 11/18/2013  . Chronic diarrhea 11/12/2013  . Benign neoplasm of colon 08/25/2013  . 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 Type 2 diabetes mellitus ([place]) 10/06/2012   Past Medical History:  Diagnosis Date  . Anxiety   . Anxiety and depression   . Arthritis   . BPH (benign prostatic hyperplasia)   . 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 Colon polyps   . Dementia ([place])   . Depression   . Diabetes mellitus type 2 in nonobese (HCC)   . GERD (gastroesophageal reflux disease)    BARRETTS ESOPHAGUS RESOLVED PER PATIENT  . Headache   . 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 History of hiatal hernia   . Hypercholesteremia   . Hypertension   .  Hyperthyroidism   . Neuropathic pain   . Primary localized osteoarthritis of right knee   . 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 Stomach ulcer   . Tremor, essential   . Wound infection complicating hardware ([place]) 04/02/2018    Family History  Problem Relation Age of Onset  . Heart attack Mother   . Heart disease Mother   . Hypertension Father   . Depression Father   . Kidney cancer Neg Hx   . Prostate cancer Neg Hx     Past Surgical History:  Procedure Laterality Date  . BACK SURGERY    . COLONOSCOPY WITH PROPOFOL N/A 09/14/2016   Procedure: COLONOSCOPY WITH PROPOFOL;  Surgeon: Manya Silvas, MD;  Location: [facility] ENDOSCOPY;  Service: Endoscopy;  Laterality: N/A;  .  esophageal stretch    . JOINT REPLACEMENT Right 2016   knee  . LUMBAR LAMINECTOMY/DECOMPRESSION MICRODISCECTOMY Left 02/03/2016   Procedure: LEFT L5-S1 DISKECTOMY;  Surgeon: Leeroy Cha, MD;  Location: [facility] NEURO ORS;  Service: Neurosurgery;  Laterality: Left;  LEFT L5-S1 DISKECTOMY  . PULSE GENERATOR IMPLANT N/A 08/21/2017   Procedure: UNILATERAL PULSE GENERATOR IMPLANT;  Surgeon: Meade Maw, MD;  Location: [facility] ORS;  Service: Neurosurgery;  Laterality: N/A;  . PULSE GENERATOR IMPLANT Right 04/02/2018   Procedure: REMOVAL OF PULSE GENERATOR IMPLANT AND LEADS;  Surgeon: Meade Maw, MD;  Location: [facility] ORS;  Service: Neurosurgery;  Laterality: Right;  . TONSILLECTOMY    . TOTAL KNEE ARTHROPLASTY Right 11/15/2014   Procedure: TOTAL KNEE ARTHROPLASTY;  Surgeon: Elsie Saas, MD;  Location: [place];  Service: Orthopedics;  Laterality: Right;   Social History   Occupational History  . Not on file  Tobacco Use  . Smoking status: Current Every Day Smoker    Packs/day: 0.50    Years: 60.00    Pack years: 30.00    Types: Cigarettes  . Smokeless tobacco: Never Used  Substance and Sexual Activity  . 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 Drug use: Never  . Sexual activity: Not Currently

## 2019-09-21 ENCOUNTER — Other Ambulatory Visit: Payer: Self-pay

## 2019-09-23 ENCOUNTER — Other Ambulatory Visit: Payer: Self-pay

## 2019-09-23 ENCOUNTER — Encounter (HOSPITAL_BASED_OUTPATIENT_CLINIC_OR_DEPARTMENT_OTHER): Payer: Self-pay | Admitting: Orthopedic Surgery

## 2019-09-24 ENCOUNTER — Other Ambulatory Visit: Payer: Self-pay | Admitting: Physician Assistant

## 2019-09-25 ENCOUNTER — Other Ambulatory Visit (HOSPITAL_COMMUNITY)
Admission: RE | Admit: 2019-09-25 | Discharge: 2019-09-25 | Disposition: A | Discharge status: Home / Self Care | Payer: PPO | Source: Ambulatory Visit | Attending: Orthopedic Surgery | Admitting: Orthopedic Surgery

## 2019-09-25 ENCOUNTER — Encounter (HOSPITAL_BASED_OUTPATIENT_CLINIC_OR_DEPARTMENT_OTHER)
Admission: RE | Admit: 2019-09-25 | Discharge: 2019-09-25 | Disposition: A | Discharge status: Home / Self Care | Payer: PPO | Source: Ambulatory Visit | Attending: Orthopedic Surgery | Admitting: Orthopedic Surgery

## 2019-09-25 ENCOUNTER — Telehealth: Payer: Self-pay | Admitting: Family Medicine

## 2019-09-25 DIAGNOSIS — E119 Type 2 diabetes mellitus without complications: Secondary | ICD-10-CM | POA: Insufficient documentation

## 2019-09-25 DIAGNOSIS — Z20822 Contact with and (suspected) exposure to covid-19: Secondary | ICD-10-CM | POA: Insufficient documentation

## 2019-09-25 DIAGNOSIS — I10 Essential (primary) hypertension: Secondary | ICD-10-CM | POA: Diagnosis not present

## 2019-09-25 DIAGNOSIS — Z01818 Encounter for other preprocedural examination: Secondary | ICD-10-CM | POA: Insufficient documentation

## 2019-09-25 LAB — BASIC METABOLIC PANEL
Anion gap: 11 (ref 5–15)
BUN: 31 mg/dL — ABNORMAL HIGH (ref 8–23)
CO2: 29 mmol/L (ref 22–32)
Calcium: 9.2 mg/dL (ref 8.9–10.3)
Chloride: 100 mmol/L (ref 98–111)
Creatinine, Ser: 1.26 mg/dL — ABNORMAL HIGH (ref 0.61–1.24)
GFR calc Af Amer: >60 mL/min (ref >60)
GFR calc non Af Amer: 54 mL/min — ABNORMAL LOW (ref >60)
Glucose, Bld: 220 mg/dL — ABNORMAL HIGH (ref 70–99)
Potassium: 4.1 mmol/L (ref 3.5–5.1)
Sodium: 140 mmol/L (ref 135–145)

## 2019-09-25 NOTE — Telephone Encounter (Signed)
I called the patient and asked him if he had a history of seizures and the  patient stated no he has never had a seizure before.  Patient did want you to know that he is having surgery on his foot next Tuesday by the provider you refrred him to, he just wanted you to know that.  James Hood,cma

## 2019-09-25 NOTE — Progress Notes (Signed)

## 2019-09-25 NOTE — Telephone Encounter (Signed)
I received a fax from his insurance noting that he may have a history of seizures. Can you call him and see if he has a history of seizures? If he does we will need to consider discontinuing his wellbutrin. Thanks.

## 2019-09-26 LAB — SARS CORONAVIRUS 2 (TAT 6-24 HRS): SARS Coronavirus 2: NEGATIVE

## 2019-09-28 NOTE — Telephone Encounter (Signed)
Noted. Form filled out for his insurance. Previously place in fax folder.

## 2019-09-29 ENCOUNTER — Other Ambulatory Visit: Payer: Self-pay

## 2019-09-29 ENCOUNTER — Encounter (HOSPITAL_BASED_OUTPATIENT_CLINIC_OR_DEPARTMENT_OTHER)
Admission: RE | Disposition: A | Discharge status: Home / Self Care | Payer: Self-pay | Source: Home / Self Care | Attending: Orthopedic Surgery

## 2019-09-29 ENCOUNTER — Encounter (HOSPITAL_BASED_OUTPATIENT_CLINIC_OR_DEPARTMENT_OTHER): Payer: Self-pay | Admitting: Orthopedic Surgery

## 2019-09-29 ENCOUNTER — Ambulatory Visit (HOSPITAL_BASED_OUTPATIENT_CLINIC_OR_DEPARTMENT_OTHER): Payer: PPO | Admitting: Certified Registered Nurse Anesthetist

## 2019-09-29 ENCOUNTER — Ambulatory Visit (HOSPITAL_BASED_OUTPATIENT_CLINIC_OR_DEPARTMENT_OTHER)
Admission: RE | Admit: 2019-09-29 | Discharge: 2019-09-29 | Disposition: A | Discharge status: Home / Self Care | Payer: PPO | Attending: Orthopedic Surgery | Admitting: Orthopedic Surgery

## 2019-09-29 DIAGNOSIS — K219 Gastro-esophageal reflux disease without esophagitis: Secondary | ICD-10-CM | POA: Diagnosis not present

## 2019-09-29 DIAGNOSIS — I10 Essential (primary) hypertension: Secondary | ICD-10-CM | POA: Insufficient documentation

## 2019-09-29 DIAGNOSIS — Z8249 Family history of ischemic heart disease and other diseases of the circulatory system: Secondary | ICD-10-CM | POA: Insufficient documentation

## 2019-09-29 DIAGNOSIS — E0590 Thyrotoxicosis, unspecified without thyrotoxic crisis or storm: Secondary | ICD-10-CM | POA: Diagnosis not present

## 2019-09-29 DIAGNOSIS — F17210 Nicotine dependence, cigarettes, uncomplicated: Secondary | ICD-10-CM | POA: Insufficient documentation

## 2019-09-29 DIAGNOSIS — N400 Enlarged prostate without lower urinary tract symptoms: Secondary | ICD-10-CM | POA: Insufficient documentation

## 2019-09-29 DIAGNOSIS — E559 Vitamin D deficiency, unspecified: Secondary | ICD-10-CM | POA: Diagnosis not present

## 2019-09-29 DIAGNOSIS — F0390 Unspecified dementia without behavioral disturbance: Secondary | ICD-10-CM | POA: Diagnosis not present

## 2019-09-29 DIAGNOSIS — E7800 Pure hypercholesterolemia, unspecified: Secondary | ICD-10-CM | POA: Diagnosis not present

## 2019-09-29 DIAGNOSIS — M1711 Unilateral primary osteoarthritis, right knee: Secondary | ICD-10-CM | POA: Insufficient documentation

## 2019-09-29 DIAGNOSIS — G250 Essential tremor: Secondary | ICD-10-CM | POA: Diagnosis not present

## 2019-09-29 DIAGNOSIS — M25872 Other specified joint disorders, left ankle and foot: Secondary | ICD-10-CM | POA: Insufficient documentation

## 2019-09-29 DIAGNOSIS — E785 Hyperlipidemia, unspecified: Secondary | ICD-10-CM | POA: Diagnosis not present

## 2019-09-29 DIAGNOSIS — M958 Other specified acquired deformities of musculoskeletal system: Secondary | ICD-10-CM

## 2019-09-29 DIAGNOSIS — Z96651 Presence of right artificial knee joint: Secondary | ICD-10-CM | POA: Insufficient documentation

## 2019-09-29 DIAGNOSIS — E119 Type 2 diabetes mellitus without complications: Secondary | ICD-10-CM | POA: Insufficient documentation

## 2019-09-29 DIAGNOSIS — K2270 Barrett's esophagus without dysplasia: Secondary | ICD-10-CM | POA: Diagnosis not present

## 2019-09-29 HISTORY — PX: ANKLE ARTHROSCOPY: SHX545

## 2019-09-29 LAB — GLUCOSE, CAPILLARY
Glucose-Capillary: 148 mg/dL — ABNORMAL HIGH (ref 70–99)
Glucose-Capillary: 165 mg/dL — ABNORMAL HIGH (ref 70–99)

## 2019-09-29 SURGERY — ARTHROSCOPY, ANKLE
Anesthesia: General | Site: Ankle | Laterality: Left

## 2019-09-29 MED ORDER — PROPOFOL 10 MG/ML IV BOLUS
INTRAVENOUS | Status: DC | PRN
  Administered 2019-09-29: 150 mg via INTRAVENOUS

## 2019-09-29 MED ORDER — ORAL CARE MOUTH RINSE: 15.0000 mL | Freq: Once | OROMUCOSAL | Status: DC

## 2019-09-29 MED ORDER — OXYCODONE HCL 5 MG PO TABS
ORAL_TABLET | ORAL | Status: AC
  Filled 2019-09-29: qty 1

## 2019-09-29 MED ORDER — OXYCODONE HCL 5 MG PO TABS
5.0000 mg | ORAL_TABLET | Freq: Once | ORAL | Status: AC | PRN
  Administered 2019-09-29: 5 mg via ORAL

## 2019-09-29 MED ORDER — FENTANYL CITRATE (PF) 100 MCG/2ML IJ SOLN
INTRAMUSCULAR | Status: DC | PRN
  Administered 2019-09-29 (×4): 25 ug via INTRAVENOUS

## 2019-09-29 MED ORDER — BUPIVACAINE HCL (PF) 0.25 % IJ SOLN
INTRAMUSCULAR | Status: AC
  Filled 2019-09-29: qty 30

## 2019-09-29 MED ORDER — DEXAMETHASONE SODIUM PHOSPHATE 10 MG/ML IJ SOLN
INTRAMUSCULAR | Status: AC
  Filled 2019-09-29: qty 1

## 2019-09-29 MED ORDER — CEFAZOLIN SODIUM-DEXTROSE 2-4 GM/100ML-% IV SOLN
2.0000 g | INTRAVENOUS | Status: AC
  Administered 2019-09-29: 2 g via INTRAVENOUS

## 2019-09-29 MED ORDER — LIDOCAINE 2% (20 MG/ML) 5 ML SYRINGE
INTRAMUSCULAR | Status: AC
  Filled 2019-09-29: qty 5

## 2019-09-29 MED ORDER — LACTATED RINGERS IV SOLN: INTRAVENOUS | Status: DC

## 2019-09-29 MED ORDER — ONDANSETRON HCL 4 MG/2ML IJ SOLN
INTRAMUSCULAR | Status: AC
  Filled 2019-09-29: qty 2

## 2019-09-29 MED ORDER — OXYCODONE HCL 5 MG/5ML PO SOLN: 5.0000 mg | Freq: Once | ORAL | Status: AC | PRN

## 2019-09-29 MED ORDER — FENTANYL CITRATE (PF) 100 MCG/2ML IJ SOLN: 25.0000 ug | INTRAMUSCULAR | Status: DC | PRN

## 2019-09-29 MED ORDER — CEFAZOLIN SODIUM-DEXTROSE 2-4 GM/100ML-% IV SOLN
INTRAVENOUS | Status: AC
  Filled 2019-09-29: qty 100

## 2019-09-29 MED ORDER — PROPOFOL 500 MG/50ML IV EMUL
INTRAVENOUS | Status: AC
  Filled 2019-09-29: qty 50

## 2019-09-29 MED ORDER — CHLORHEXIDINE GLUCONATE 0.12 % MT SOLN
15.0000 mL | Freq: Once | OROMUCOSAL | Status: DC
  Filled 2019-09-29: qty 15

## 2019-09-29 MED ORDER — EPHEDRINE 5 MG/ML INJ
INTRAVENOUS | Status: AC
  Filled 2019-09-29: qty 10

## 2019-09-29 MED ORDER — DEXAMETHASONE SODIUM PHOSPHATE 10 MG/ML IJ SOLN
INTRAMUSCULAR | Status: DC | PRN
  Administered 2019-09-29: 4 mg via INTRAVENOUS

## 2019-09-29 MED ORDER — BUPIVACAINE HCL (PF) 0.25 % IJ SOLN
INTRAMUSCULAR | Status: DC | PRN
  Administered 2019-09-29: 10 mL

## 2019-09-29 MED ORDER — ONDANSETRON HCL 4 MG/2ML IJ SOLN: 4.0000 mg | Freq: Once | INTRAMUSCULAR | Status: DC | PRN

## 2019-09-29 MED ORDER — LIDOCAINE 2% (20 MG/ML) 5 ML SYRINGE
INTRAMUSCULAR | Status: DC | PRN
  Administered 2019-09-29: 60 mg via INTRAVENOUS

## 2019-09-29 MED ORDER — EPHEDRINE SULFATE-NACL 50-0.9 MG/10ML-% IV SOSY
PREFILLED_SYRINGE | INTRAVENOUS | Status: DC | PRN
  Administered 2019-09-29: 15 mg via INTRAVENOUS
  Administered 2019-09-29 (×2): 10 mg via INTRAVENOUS

## 2019-09-29 MED ORDER — MIDAZOLAM HCL 2 MG/2ML IJ SOLN: 1.0000 mg | INTRAMUSCULAR | Status: DC | PRN

## 2019-09-29 MED ORDER — ONDANSETRON HCL 4 MG/2ML IJ SOLN
INTRAMUSCULAR | Status: DC | PRN
  Administered 2019-09-29: 4 mg via INTRAVENOUS

## 2019-09-29 MED ORDER — FENTANYL CITRATE (PF) 100 MCG/2ML IJ SOLN
INTRAMUSCULAR | Status: AC
  Filled 2019-09-29: qty 2

## 2019-09-29 SURGICAL SUPPLY — 35 items
BNDG CMPR 9X4 STRL LF SNTH (GAUZE/BANDAGES/DRESSINGS)
BNDG COHESIVE 4X5 TAN STRL (GAUZE/BANDAGES/DRESSINGS) IMPLANT
BNDG COHESIVE 6X5 TAN STRL LF (GAUZE/BANDAGES/DRESSINGS) ×2 IMPLANT
BNDG ESMARK 4X9 LF (GAUZE/BANDAGES/DRESSINGS) IMPLANT
COVER WAND RF STERILE (DRAPES) IMPLANT
DISSECTOR 4.0MM X 13CM (MISCELLANEOUS) ×2 IMPLANT
DRAPE ARTHROSCOPY W/POUCH 90 (DRAPES) ×2 IMPLANT
DRAPE OEC MINIVIEW 54X84 (DRAPES) IMPLANT
DRAPE U-SHAPE 47X51 STRL (DRAPES) ×2 IMPLANT
DRSG EMULSION OIL 3X3 NADH (GAUZE/BANDAGES/DRESSINGS) ×2 IMPLANT
DURAPREP 26ML APPLICATOR (WOUND CARE) ×2 IMPLANT
EXCALIBUR 3.8MM X 13CM (MISCELLANEOUS) ×2 IMPLANT
GAUZE SPONGE 4X4 12PLY STRL (GAUZE/BANDAGES/DRESSINGS) ×2 IMPLANT
GLOVE BIOGEL PI IND STRL 7.0 (GLOVE) ×1 IMPLANT
GLOVE BIOGEL PI IND STRL 9 (GLOVE) ×1 IMPLANT
GLOVE BIOGEL PI INDICATOR 7.0 (GLOVE) ×1
GLOVE BIOGEL PI INDICATOR 9 (GLOVE) ×1
GLOVE SURG ORTHO 9.0 STRL STRW (GLOVE) ×2 IMPLANT
GLOVE SURG SS PI 7.0 STRL IVOR (GLOVE) ×4 IMPLANT
GOWN STRL REUS W/ TWL LRG LVL3 (GOWN DISPOSABLE) ×1 IMPLANT
GOWN STRL REUS W/ TWL XL LVL3 (GOWN DISPOSABLE) ×1 IMPLANT
GOWN STRL REUS W/TWL LRG LVL3 (GOWN DISPOSABLE) ×2
GOWN STRL REUS W/TWL XL LVL3 (GOWN DISPOSABLE) ×2
MANIFOLD NEPTUNE II (INSTRUMENTS) IMPLANT
PACK DSU ARTHROSCOPY (CUSTOM PROCEDURE TRAY) ×2 IMPLANT
PORT APPOLLO RF 90DEGREE MULTI (SURGICAL WAND) IMPLANT
PROBE APOLLO 90XL (SURGICAL WAND) ×2 IMPLANT
SET BASIN DAY SURGERY F.S. (CUSTOM PROCEDURE TRAY) ×2 IMPLANT
SLEEVE SCD COMPRESS KNEE MED (MISCELLANEOUS) ×2 IMPLANT
STRAP ANKLE FOOT DISTRACTOR (ORTHOPEDIC SUPPLIES) ×2 IMPLANT
SUT ETHILON 2 0 FSLX (SUTURE) ×2 IMPLANT
SYR 5ML LL (SYRINGE) IMPLANT
TOWEL GREEN STERILE FF (TOWEL DISPOSABLE) ×2 IMPLANT
TUBING ARTHROSCOPY IRRIG 16FT (MISCELLANEOUS) ×2 IMPLANT
WATER STERILE IRR 1000ML POUR (IV SOLUTION) ×2 IMPLANT

## 2019-09-29 NOTE — Transfer of Care (Signed)
Immediate Anesthesia Transfer of Care Note  Patient: James Hood  Procedure(s) Performed: LEFT ANKLE ARTHROSCOPY, DEBRIDEMENT (Left Ankle)  Patient Location: PACU  Anesthesia Type:General  Level of Consciousness: awake, alert  and oriented  Airway & Oxygen Therapy: Patient Spontanous Breathing and Patient connected to face mask oxygen  Post-op Assessment: Report given to RN and Post -op Vital signs reviewed and stable  Post vital signs: Reviewed and stable  Last Vitals:  Vitals Value Taken Time  BP 114/78 09/29/19 0915  Temp 36.5 C 09/29/19 0900  Pulse 81 09/29/19 0919  Resp 23 09/29/19 0918  SpO2 95 % 09/29/19 0919  Vitals shown include unvalidated device data.  Last Pain:  Vitals:   09/29/19 0915  TempSrc:   PainSc: 0-No pain         Complications: No apparent anesthesia complications

## 2019-09-29 NOTE — Anesthesia Preprocedure Evaluation (Addendum)
Anesthesia Evaluation  Patient identified by MRN, date of birth, ID band Patient awake    Reviewed: Allergy & Precautions, NPO status , Patient's Chart, lab work & pertinent test results, reviewed documented beta blocker date and time   History of Anesthesia Complications Negative for: history of anesthetic complications  Airway Mallampati: II  TM Distance: >3 FB Neck ROM: Full    Dental  (+) Edentulous Upper   Pulmonary Current SmokerPatient did not abstain from smoking.,    Pulmonary exam normal        Cardiovascular hypertension, Pt. on medications and Pt. 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)  negative genitourinary   Musculoskeletal  (+) Arthritis , narcotic dependent  Abdominal   Peds  Hematology negative hematology ROS (+)   Anesthesia Other Findings  Chronic LBP s/p spinal cord stimulator implant  Reproductive/Obstetrics                           Anesthesia Physical Anesthesia Plan  ASA: III  Anesthesia Plan: General   Post-op Pain Management:    Induction: Intravenous  PONV Risk Score and Plan: 1 and Ondansetron, Dexamethasone, Midazolam and Treatment may vary due to age or medical condition  Airway Management Planned: LMA  Additional Equipment: None  Intra-op Plan:   Post-operative Plan: Extubation in OR  Informed Consent: I have reviewed the patients History and Physical, chart, labs and discussed the procedure including the risks, benefits and alternatives for the proposed anesthesia with the patient or authorized representative who has indicated his/her understanding and acceptance.     Dental advisory  given  Plan Discussed with:   Anesthesia Plan Comments:         Anesthesia Quick Evaluation

## 2019-09-29 NOTE — Anesthesia Procedure Notes (Signed)
Procedure Name: LMA Insertion Date/Time: 09/29/2019 8:15 AM Performed by: Genelle Bal, CRNA Pre-anesthesia Checklist: Patient identified, Emergency Drugs available, Suction available and Patient being monitored Patient Re-evaluated:Patient Re-evaluated prior to induction Oxygen Delivery Method: Circle system utilized Preoxygenation: Pre-oxygenation with 100% oxygen Induction Type: IV induction Ventilation: Mask ventilation without difficulty LMA: LMA inserted LMA Size: 4.0 Number of attempts: 1 Airway Equipment and Method: Bite block Placement Confirmation: positive ETCO2 Tube secured with: Tape Dental Injury: Teeth and Oropharynx as per pre-operative assessment

## 2019-09-29 NOTE — Anesthesia Postprocedure Evaluation (Signed)
Anesthesia Post Note  Patient: James Hood  Procedure(s) Performed: LEFT ANKLE ARTHROSCOPY, DEBRIDEMENT (Left Ankle)     Patient location during evaluation: PACU Anesthesia Type: General Level of consciousness: awake and alert Pain management: pain level controlled Vital Signs Assessment: post-procedure vital signs reviewed and stable Respiratory status: spontaneous breathing, nonlabored ventilation and respiratory function stable Cardiovascular status: blood pressure returned to baseline and stable Postop Assessment: no apparent nausea or vomiting Anesthetic complications: no    Last Vitals:  Vitals:   09/29/19 0915 09/29/19 1015  BP: 114/78 113/73  Pulse: 73 76  Resp: 12 14  Temp:  (!) 36.3 C  SpO2: 96% 95%    Last Pain:  Vitals:   09/29/19 1015  TempSrc:   PainSc: 3                  Lidia Collum

## 2019-09-29 NOTE — Discharge Instructions (Signed)
°  Post Anesthesia Home Care Instructions  Activity: Get plenty of rest for the remainder of the day. A responsible individual must stay with you for 24 hours following the procedure.  For the next 24 hours, DO NOT: -Drive a car -Paediatric nurse -Drink alcoholic beverages -Take any medication unless instructed by your physician -Make any legal decisions or sign important papers.  Meals: Start with liquid foods such as gelatin or soup. Progress to regular foods as tolerated. Avoid greasy, spicy, heavy foods. If nausea and/or vomiting occur, drink only clear liquids until the nausea and/or vomiting subsides. Call your physician if vomiting continues.  Special Instructions/Symptoms: Your throat may feel dry or sore from the anesthesia or the breathing tube placed in your throat during surgery. If this causes discomfort, gargle with warm salt water. The discomfort should disappear within 24 hours.     Call your surgeon if you experience:   1.  Fever over 101.0. 2.  Inability to urinate. 3.  Nausea and/or vomiting. 4.  Extreme swelling or bruising at the surgical site. 5.  Continued bleeding from the incision. 6.  Increased pain, redness or drainage from the incision. 7.  Problems related to your pain medication. 8.  Any problems and/or concerns

## 2019-09-29 NOTE — H&P (Signed)
James Hood is an 79 y.o. male.   Chief Complaint: Left Ankle pain HPI:  Patient is a 79 year old gentleman who presents with chronic impingement symptoms of the left ankle.  Patient states he has pain over the anterior lateral joint line which radiates proximally.  Patient states he has decreased range of motion pain with activities of daily living he states he has had steroid injections x2 with podiatry with only about 1 week of relief from each injection.  Patient is status post radiographs in 2020 and an MRI scan in 2019.  Past Medical History:  Diagnosis Date  . Anxiety   . Anxiety and depression   . Arthritis   . BPH (benign prostatic hyperplasia)   . 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 Colon polyps   . Dementia ([place])   . Depression   . Diabetes mellitus type 2 in nonobese (HCC)   . GERD (gastroesophageal reflux disease)    BARRETTS ESOPHAGUS RESOLVED PER PATIENT  . Headache   . 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 History of hiatal hernia   . Hypercholesteremia   . Hypertension   . Hyperthyroidism   . Neuropathic pain   . Primary localized osteoarthritis of right knee   . 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 Stomach ulcer   . Tremor, essential   . Wound infection complicating hardware ([place]) 04/02/2018    Past Surgical History:  Procedure Laterality Date  . BACK SURGERY    . COLONOSCOPY WITH PROPOFOL N/A 09/14/2016   Procedure: COLONOSCOPY WITH PROPOFOL;  Surgeon: Manya Silvas, MD;  Location: [facility] ENDOSCOPY;  Service: Endoscopy;  Laterality: N/A;  . esophageal stretch    . JOINT REPLACEMENT Right 2016   knee  . LUMBAR LAMINECTOMY/DECOMPRESSION MICRODISCECTOMY Left 02/03/2016   Procedure: LEFT L5-S1 DISKECTOMY;  Surgeon: Leeroy Cha, MD;  Location: [place] NEURO ORS;  Service: Neurosurgery;  Laterality: Left;  LEFT L5-S1 DISKECTOMY  . PULSE GENERATOR IMPLANT N/A 08/21/2017   Procedure: UNILATERAL PULSE GENERATOR IMPLANT;   Surgeon: Meade Maw, MD;  Location: [facility] ORS;  Service: Neurosurgery;  Laterality: N/A;  . PULSE GENERATOR IMPLANT Right 04/02/2018   Procedure: REMOVAL OF PULSE GENERATOR IMPLANT AND LEADS;  Surgeon: Meade Maw, MD;  Location: [facility] ORS;  Service: Neurosurgery;  Laterality: Right;  . TONSILLECTOMY    . TOTAL KNEE ARTHROPLASTY Right 11/15/2014   Procedure: TOTAL KNEE ARTHROPLASTY;  Surgeon: Elsie Saas, MD;  Location: [place];  Service: Orthopedics;  Laterality: Right;    Family History  Problem Relation Age of Onset  . Heart attack Mother   . Heart disease Mother   . Hypertension Father   . Depression Father   . Kidney cancer Neg Hx   . Prostate cancer Neg Hx    Social History:  reports that he has been smoking cigarettes. He has a 30.00 pack-year smoking history. He has never used smokeless tobacco. He reports previous alcohol use. He reports that he does not use drugs.  Allergies: No Known Allergies  No medications prior to admission.    No results found for this or any previous visit (from the past 48 hour(s)). No results found.  Review of Systems  All other systems reviewed and are negative.   Height 5\' 10"  (1.778 m). Physical Exam  Patient is alert, oriented, no adenopathy, well-dressed, normal affect, normal respiratory effort. Examination patient has a good dorsalis pedis and posterior tibial pulse he has good ankle good subtalar motion  with dorsiflexion of the ankle he reproduces his pain over the anterior lateral joint line there is no pain to palpation over the sinus Tarsi the peroneal and posterior tibial tendons are nontender to palpation he has directly tender to palpation over the anterior joint line.  Review of the radiographs shows no bony abnormalities.  Review of the MRI scan shows no osteochondral defect and no bony abnormalities.  Patient's most recent hemoglobin A1c is 7.7.  Assessment/Plan Impingement syndrome of left ankle     Plan:  Patient symptoms are consistent with impingement syndrome.  Discussed that there is approximately 80% chance that we should improve his symptoms with arthroscopy.  Discussed that there is a 1% risk of complications including infection neurovascular injury persistent pain need for additional surgery.  Patient states he understands wished to proceed at this time.  We will set this up as outpatient surgery at [facility] day surgery.  Follow-Up Instructions: Return in about 2 weeks (around 09/14/2019).    Bevely Palmer Levora Werden, PA 09/29/2019, 6:32 AM

## 2019-09-29 NOTE — Op Note (Signed)
09/29/2019  8:50 AM  PATIENT:  James Hood    PRE-OPERATIVE DIAGNOSIS:  Impingement Left Ankle  POST-OPERATIVE DIAGNOSIS:  Same  PROCEDURE:  LEFT ANKLE ARTHROSCOPY, DEBRIDEMENT  SURGEON:  Newt Minion, MD  PHYSICIAN ASSISTANT:None ANESTHESIA:   General  PREOPERATIVE INDICATIONS:  DOWARD KHAIMOV is a  79 y.o. male with a diagnosis of Impingement Left Ankle who failed conservative measures and elected for surgical management.    The risks benefits and alternatives were discussed with the patient preoperatively including but not limited to the risks of infection, bleeding, nerve injury, cardiopulmonary complications, the need for revision surgery, among others, and the patient was willing to proceed.  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  OPERATIVE PROCEDURE: Patient was brought the operating room and underwent a general anesthetic.  After adequate levels anesthesia were obtained patient's left lower extremity was prepped using DuraPrep draped into a sterile field a timeout was called.  The scope was inserted through the anterior medial portal anterior lateral working portal was established.  The skin was incised blunt dissection was carried down to the capsule and a blunt trocar was used to insert into the joint.  Patient had a hemarthrosis.  The shaver was used and extensive synovectomy was performed in both the medial and lateral gutters and anteriorly.  There were grooves in the articular cartilage of the talar dome secondary to the osteophytic bone spurs of the distal tibia.  The shaver was used to further resect the anterior osteophytic bone spurs.  The medial lateral gutters were cleansed.  The electrocautery was used for further hemostasis.  The instruments were removed the joint was injected with a total of 20 cc of quarter percent Marcaine  plain portals were closed using 2-0 nylon a sterile dressing was applied patient was extubated taken the PACU in stable condition.   DISCHARGE PLANNING:  Antibiotic duration: Preoperative antibiotics 2 g Kefzol  Weightbearing: Weightbearing as tolerated  Pain medication: Prescription for Percocet  Dressing care/ Wound VAC: Change dressing in 2 days  Ambulatory devices: Crutches  Discharge to: Home.  Follow-up: In the office 1 week post operative.

## 2019-09-30 ENCOUNTER — Encounter: Payer: Self-pay | Admitting: *Deleted

## 2019-10-06 ENCOUNTER — Encounter: Payer: Self-pay | Admitting: Student in an Organized Health Care Education/Training Program

## 2019-10-06 ENCOUNTER — Ambulatory Visit
Payer: PPO | Attending: Student in an Organized Health Care Education/Training Program | Admitting: Student in an Organized Health Care Education/Training Program

## 2019-10-06 ENCOUNTER — Other Ambulatory Visit: Payer: Self-pay | Admitting: Student in an Organized Health Care Education/Training Program

## 2019-10-06 ENCOUNTER — Other Ambulatory Visit: Payer: Self-pay

## 2019-10-06 VITALS — BP 93/63 | HR 82 | Temp 97.7°F | Resp 18 | Ht 71.0 in | Wt 165.0 lb

## 2019-10-06 DIAGNOSIS — T85192S Other mechanical complication of implanted electronic neurostimulator (electrode) of spinal cord, sequela: Secondary | ICD-10-CM | POA: Insufficient documentation

## 2019-10-06 DIAGNOSIS — G894 Chronic pain syndrome: Secondary | ICD-10-CM | POA: Insufficient documentation

## 2019-10-06 DIAGNOSIS — M5417 Radiculopathy, lumbosacral region: Secondary | ICD-10-CM | POA: Diagnosis not present

## 2019-10-06 DIAGNOSIS — F1190 Opioid use, unspecified, uncomplicated: Secondary | ICD-10-CM | POA: Diagnosis not present

## 2019-10-06 DIAGNOSIS — Z79891 Long term (current) use of opiate analgesic: Secondary | ICD-10-CM | POA: Diagnosis not present

## 2019-10-06 DIAGNOSIS — M5386 Other specified dorsopathies, lumbar region: Secondary | ICD-10-CM | POA: Insufficient documentation

## 2019-10-06 DIAGNOSIS — M961 Postlaminectomy syndrome, not elsewhere classified: Secondary | ICD-10-CM | POA: Diagnosis not present

## 2019-10-06 DIAGNOSIS — M19072 Primary osteoarthritis, left ankle and foot: Secondary | ICD-10-CM | POA: Insufficient documentation

## 2019-10-06 MED ORDER — OXYCODONE-ACETAMINOPHEN 10-325 MG PO TABS: 1.0000 | ORAL_TABLET | Freq: Four times a day (QID) | ORAL | 0 refills | Status: DC | PRN

## 2019-10-06 NOTE — Progress Notes (Signed)
PROVIDER NOTE: Information contained herein reflects review and annotations entered in association with encounter. Interpretation of such information and data should be left to medically-trained personnel. Information provided to patient can be located elsewhere in the medical record under "Patient Instructions". Document created using STT-dictation technology, any transcriptional errors that may result from process are unintentional.    Patient: James Hood  Service Category: E/M  Provider: [name], MD  DOB: 26-Jul-1940  DOS: 10/06/2019  Referring Provider: Leone Haven, MD  MRN: [id number]  Setting: Ambulatory outpatient  PCP: Leone Haven, MD  Type: Established Patient  Specialty: Interventional Pain Management    Location: Office  Delivery: Face-to-face     Primary Reason(s) for Visit: Encounter for prescription drug management. (Level of risk: moderate)  CC: Ankle Pain (left) and Hip Pain (left)  HPI  James Hood is a 79 y.o. year old, male patient, who comes today for a medication management evaluation. He has Hypertension; Tremor, essential; Primary localized osteoarthritis of right knee; Anxiety and depression; DJD (degenerative joint disease) of knee; Trochanteric bursitis of left hip; Headache; Falls; Barrett's esophagus; Chronic lumbar pain; Benign prostatic hyperplasia; Benign essential tremor; Benign neoplasm of colon; Chronic diarrhea; Type 2 diabetes mellitus ([place]); Difficulty in walking; HLD (hyperlipidemia); Amnesia; Testicular hypofunction; Absence of sensation; Tobacco abuse; Hypertriglyceridemia; Anemia; Lumbar herniated disc; Right hip pain; Purpura ([place]); Thyroid nodule; Fatty liver; Chronic pain syndrome; Failed back surgical syndrome; Lumbosacral radiculopathy; Primary osteoarthritis of left ankle; Facet arthritis of lumbar region; Neuropathy; Left shoulder pain; Vitamin D deficiency; Chronic pain of left ankle; Weight loss; Sleeping difficulty; Hiccups; and  Impingement of left ankle joint on their problem list. His primarily concern today is the Ankle Pain (left) and Hip Pain (left)  Pain Assessment: Location: Left Ankle Radiating: up leg to calve Onset: More than a month ago Duration: Chronic pain Quality: Aching, Constant, Discomfort Severity: 5 /10 (subjective, self-reported pain score)  Note: Reported level is compatible with observation.                         When using our objective Pain Scale, levels between 6 and 10/10 are said to belong in an emergency room, as it progressively worsens from a 6/10, described as severely limiting, requiring emergency care not usually available at an outpatient pain management facility. At a 6/10 level, communication becomes difficult and requires great effort. Assistance to reach the emergency department may be required. Facial flushing and profuse sweating along with potentially dangerous increases in heart rate and blood pressure will be evident. Effect on ADL: Prolong walking or standing. Timing: Constant Modifying factors: medications BP: 93/63  HR: 82  James Hood was last scheduled for an appointment on 07/09/2019 for medication management. During today's appointment we reviewed James Hood's chronic pain status, as well as his outpatient medication regimen.  Left ankle surgery on 09/29/19 recovering from surgery. Has some flexion/extension of the ankle but fairly limited range of motion due to pain.  As a result of ankle surgery, has been utilizing more of his Oxycodone (4 tablets rather than 3 tablets as prescribed) Still interested in considering spinal cord stimulator implant again in the future but wants to hold off at this time.  Otherwise no other changes in his medication regimen.  The patient  reports no history of drug use. His body mass index is 23.01 kg/m.  Further details on both, my assessment(s), as well as the proposed treatment plan, please see below.  Controlled Substance  Pharmacotherapy Assessment REMS (Risk Evaluation and Mitigation Strategy)  Analgesic: 09/08/2019  1   07/09/2019  Oxycodone-Acetaminophen 10-325  100.00  30 Bi Lat   [id number]   Har ([id number])   0  50.00 MME  Medicare   James Hood    James Specking, RN  10/06/2019  1:59 PM  Sign when Signing Visit Safety precautions to be maintained throughout the outpatient stay will include: orient to surroundings, keep bed in low position, maintain call bell within reach at all times, provide assistance with transfer out of bed and ambulation.   Patient did not bring medications with him today. States he has about 1/2  Remaining.   Pharmacokinetics: Liberation and absorption (onset of action): WNL Distribution (time to peak effect): WNL Metabolism and excretion (duration of action): WNL         Pharmacodynamics: Desired effects: Analgesia: [name] reports >50% benefit. Functional ability: Patient reports that medication allows him to accomplish basic ADLs Clinically meaningful improvement in function (CMIF): Sustained CMIF goals met Perceived effectiveness: Described as relatively effective, allowing for increase in activities of daily living (ADL) Undesirable effects: Side-effects or Adverse reactions: None reported Monitoring:  PMP: PDMP not reviewed this encounter. Online review of the past 75-monthperiod conducted. Compliant with practice rules and regulations Last UDS on record: Summary  Date Value Ref Range Status  04/15/2019 Note  Final    Comment:    ==================================================================== [employer] Select 13 (MW) ==================================================================== Test                             Result       Flag       Units Drug Present and Declared for Prescription Verification   Phenobarbital                  PRESENT      EXPECTED    Phenobarbital is an expected metabolite of primidone; Phenobarbital    may also be administered as a  prescription drug. Drug Present not Declared for Prescription Verification   Alcohol, Ethyl                 0.024        UNEXPECTED g/dL    Sources of ethyl alcohol include alcoholic beverages or as a    fermentation product of glucose; glucose is present in this specimen.    Interpret result with caution, as the presence of ethyl alcohol is    likely due, at least in part, to fermentation of glucose.   Hydromorphone                  4719         UNEXPECTED ng/mg creat    Hydromorphone may be administered as a scheduled prescription    medication; it is also an expected metabolite of hydrocodone. Drug Absent but Declared for Prescription Verification   Oxycodone                      Not Detected UNEXPECTED ng/mg creat ==================================================================== Test                      Result    Flag   Units      Ref Range   Creatinine              78  mg/dL      >=20 ==================================================================== Declared Medications:  The flagging and interpretation on this report are based on the  following declared medications.  Unexpected results may arise from  inaccuracies in the declared medications.  **Note: The testing scope of this panel includes these medications:  Oxycodone  Primidone  **Note: The testing scope of this panel does not include the  following reported medications:  Acetaminophen  Aspirin  Atropine (Lomotil)  Bupropion  Chlorhexidine  Diphenoxylate (Lomotil)  Empagliflozin  Fenofibrate  Finasteride  Gabapentin  Hydrochlorothiazide  Lisinopril  Melatonin  Memantine  Metoprolol  Multivitamin  Nicotine  Omeprazole  Quetiapine  Rosuvastatin  Semaglutide  Tamsulosin  Valproic Acid  Vitamin D2 (Ergocalciferol) ==================================================================== For clinical consultation, please call [phone]. ====================================================================    UDS interpretation: Compliant          Medication Assessment Form: Reviewed. Patient indicates being compliant with therapy Treatment compliance: Compliant Risk Assessment Profile: Aberrant behavior: See initial evaluations. None observed or detected today Comorbid factors increasing risk of overdose: See initial evaluation. No additional risks detected today Opioid risk tool (ORT):  Opioid Risk  10/06/2019  Alcohol 0  Illegal Drugs 0  Rx Drugs -  Alcohol 0  Illegal Drugs 0  Rx Drugs 0  Age between 16-45 years  0  History of Preadolescent Sexual Abuse 0  Psychological Disease 0  ADD -  OCD -  Bipolar -  Depression 0  Opioid Risk Tool Scoring 0  Opioid Risk Interpretation Low Risk    ORT Scoring interpretation table:  Score <3 = Low Risk for SUD  Score between 4-7 = Moderate Risk for SUD  Score >8 = High Risk for Opioid Abuse   Risk of substance use disorder (SUD): Low  Risk Mitigation Strategies:  Patient Counseling: Covered Patient-Prescriber Agreement (PPA): Present and active  Notification to other healthcare providers: Done  Pharmacologic Plan: No change in therapy, at this time.             Laboratory Chemistry Profile   Renal Lab Results  Component Value Date   BUN 31 (H) 09/25/2019   CREATININE 1.26 (H) 09/25/2019   GFR 46.52 (L) 07/20/2019   GFRAA >60 09/25/2019   GFRNONAA 54 (L) 09/25/2019   SPECGRAV 1.010 07/20/2019   PHUR 5.0 07/20/2019   PROTEINUR Negative 07/20/2019     Electrolytes Lab Results  Component Value Date   NA 140 09/25/2019   K 4.1 09/25/2019   CL 100 09/25/2019   CALCIUM 9.2 09/25/2019     Hepatic Lab Results  Component Value Date   AST 33 08/05/2019   ALT 27 08/05/2019   ALBUMIN 3.6 08/05/2019   ALKPHOS 48 08/05/2019   AMMONIA 14 08/09/2016     ID Lab Results  Component Value Date   HIV NONREACTIVE 06/19/2016   SARSCOV2NAA NEGATIVE  09/25/2019   STAPHAUREUS NEGATIVE 08/15/2017   MRSAPCR NEGATIVE 08/15/2017     Bone Lab Results  Component Value Date   VD25OH 42.83 04/07/2019     Endocrine Lab Results  Component Value Date   GLUCOSE 220 (H) 09/25/2019   GLUCOSEU NEGATIVE 04/01/2018   HGBA1C 7.7 (H) 05/25/2019   TSH 1.36 07/20/2019     Neuropathy Lab Results  Component Value Date   VITAMINB12 341 02/02/2019   FOLATE 13.3 03/19/2018   HGBA1C 7.7 (H) 05/25/2019   HIV NONREACTIVE 06/19/2016     CNS No results found for: COLORCSF, APPEARCSF, RBCCOUNTCSF, WBCCSF, POLYSCSF, LYMPHSCSF, EOSCSF, PROTEINCSF, GLUCCSF, JCVIRUS,  CSFOLI, IGGCSF, LABACHR, ACETBL, LABACHR, ACETBL   Inflammation (CRP: Acute  ESR: Chronic) Lab Results  Component Value Date   ESRSEDRATE 15 07/20/2019     Rheumatology No results found for: RF, ANA, LABURIC, URICUR, LYMEIGGIGMAB, LYMEABIGMQN, HLAB27   Coagulation Lab Results  Component Value Date   INR 0.91 04/01/2018   LABPROT 12.2 04/01/2018   APTT 35 04/01/2018   PLT 208.0 07/20/2019     Cardiovascular Lab Results  Component Value Date   CKTOTAL 468 (H) 08/14/2016   HGB 12.3 (L) 07/20/2019   HCT 37.2 (L) 07/20/2019     Screening Lab Results  Component Value Date   SARSCOV2NAA NEGATIVE 09/25/2019   STAPHAUREUS NEGATIVE 08/15/2017   MRSAPCR NEGATIVE 08/15/2017   HIV NONREACTIVE 06/19/2016     Cancer No results found for: CEA, CA125, LABCA2   Allergens No results found for: ALMOND, APPLE, ASPARAGUS, AVOCADO, BANANA, BARLEY, BASIL, BAYLEAF, GREENBEAN, LIMABEAN, WHITEBEAN, BEEFIGE, REDBEET, BLUEBERRY, BROCCOLI, CABBAGE, MELON, CARROT, CASEIN, CASHEWNUT, CAULIFLOWER, CELERY     Note: Lab results reviewed.   Recent Diagnostic Imaging Results  CT CHEST LUNG CANCER SCREENING LOW DOSE WO CONTRAST CLINICAL DATA:  79 year old male current smoker, with 30 pack-year history of smoking, for initial lung cancer screening  EXAM: CT CHEST WITHOUT CONTRAST LOW-DOSE FOR LUNG  CANCER SCREENING  TECHNIQUE: Multidetector CT imaging of the chest was performed following the standard protocol without IV contrast.  COMPARISON:  CT chest dated 01/11/2017  FINDINGS: Cardiovascular: Heart is normal in size.  No pericardial effusion.  No evidence thoracic aortic aneurysm. Mild atherosclerotic calcifications of the aortic arch.  Coronary atherosclerosis of the LAD and left circumflex.  Mediastinum/Nodes: No suspicious mediastinal lymphadenopathy. Calcified subcarinal and right azygoesophageal recess nodes, benign.  Bilateral thyroid nodules, measuring up to 18 mm on the right. This has been previously biopsied via ultrasound in 2019. No followup recommended, given prior biopsy. (Ref: J Am Coll Radiol. 2015 Feb;12(2): 143-50).  Lungs/Pleura: No focal consolidation.  No suspicious pulmonary nodules. 5 mm calcified granuloma in the right lower lobe, benign.  Faint ground-glass opacities in the right lower lobe, likely infectious/inflammatory or related to smoking-related lung disease.  No pleural effusion or pneumothorax.  Upper Abdomen: Visualized upper abdomen is grossly unremarkable, noting a moderate duodenal diverticulum (incompletely visualized).  Musculoskeletal: Degenerative changes of the visualized thoracolumbar spine.  IMPRESSION: Lung-RADS 1, negative. Continue annual screening with low-dose chest CT without contrast in 12 months.  Aortic Atherosclerosis (ICD10-I70.0).  Electronically Signed   By: Julian Hy M.D.   On: 08/04/2019 12:01  Complexity Note: Imaging results reviewed. Results shared with Mr. Beeney, using Layman's terms.                               Meds   Current Outpatient Medications:  .  aspirin EC 81 MG tablet, Take 81 mg by mouth daily., Disp: , Rfl:  .  budesonide (ENTOCORT EC) 3 MG 24 hr capsule, Take 9 mg by mouth daily., Disp: , Rfl:  .  buPROPion (WELLBUTRIN SR) 150 MG 12 hr tablet, TAKE ONE TABLET BY  MOUTH TWICE A DAY, Disp: 180 tablet, Rfl: 1 .  diphenoxylate-atropine (LOMOTIL) 2.5-0.025 MG tablet, Take 1 tablet by mouth 2 (two) times daily. , Disp: , Rfl:  .  divalproex (DEPAKOTE ER) 500 MG 24 hr tablet, Take 1 tablet by mouth 2 (two) times daily., Disp: , Rfl:  .  donepezil (ARICEPT) 10 MG tablet, Take  10 mg by mouth at bedtime., Disp: , Rfl:  .  fenofibrate (TRICOR) 145 MG tablet, Take 1 tablet (145 mg total) by mouth daily., Disp: 90 tablet, Rfl: 1 .  finasteride (PROSCAR) 5 MG tablet, TAKE ONE TABLET BY MOUTH DAILY, Disp: 90 tablet, Rfl: 0 .  gabapentin (NEURONTIN) 600 MG tablet, TAKE TWO TABLETS BY MOUTH THREE TIMES A DAY, Disp: 180 tablet, Rfl: 2 .  icosapent Ethyl (VASCEPA) 1 g capsule, Take 2 capsules (2 g total) by mouth 2 (two) times daily., Disp: 360 capsule, Rfl: 2 .  JARDIANCE 10 MG TABS tablet, TAKE ONE TABLET BY MOUTH DAILY BEFORE BREAKFAST, Disp: 30 tablet, Rfl: 3 .  lisinopril-hydrochlorothiazide (ZESTORETIC) 20-25 MG tablet, TAKE ONE TABLET BY MOUTH TWICE A DAY, Disp: 180 tablet, Rfl: 1 .  meloxicam (MOBIC) 7.5 MG tablet, TAKE ONE TABLET BY MOUTH DAILY WITH FOOD AS NEEDED FOR PAIN, Disp: 30 tablet, Rfl: 0 .  memantine (NAMENDA) 10 MG tablet, TAKE ONE TABLET BY MOUTH TWICE A DAY, Disp: , Rfl:  .  metoprolol succinate (TOPROL-XL) 25 MG 24 hr tablet, Take 25 mg by mouth at bedtime. , Disp: , Rfl:  .  Multiple Vitamins-Minerals (CENTRUM SILVER PO), Take 1 tablet by mouth daily., Disp: , Rfl:  .  omeprazole (PRILOSEC) 20 MG capsule, Take 20 mg by mouth 2 (two) times daily before a meal. , Disp: , Rfl:  .  [START ON 11/07/2019] oxyCODONE-acetaminophen (PERCOCET) 10-325 MG tablet, Take 1 tablet by mouth every 6 (six) hours as needed for pain. Must last 30 days. Max #100/month, Disp: 100 tablet, Rfl: 0 .  [START ON 12/07/2019] oxyCODONE-acetaminophen (PERCOCET) 10-325 MG tablet, Take 1 tablet by mouth every 6 (six) hours as needed for pain. Must last 30 days. Max #100/month, Disp: 100  tablet, Rfl: 0 .  [START ON 01/06/2020] oxyCODONE-acetaminophen (PERCOCET) 10-325 MG tablet, Take 1 tablet by mouth every 6 (six) hours as needed for pain. Must last 30 days. Max #100/month, Disp: 100 tablet, Rfl: 0 .  primidone (MYSOLINE) 50 MG tablet, Take 150-250 mg by mouth 2 (two) times daily. Take 250 mg by mouth in the morning & take 150 mg at night., Disp: , Rfl:  .  QUEtiapine (SEROQUEL) 25 MG tablet, Take 125 mg by mouth at bedtime. , Disp: , Rfl:  .  rosuvastatin (CRESTOR) 40 MG tablet, TAKE ONE TABLET BY MOUTH DAILY, Disp: 90 tablet, Rfl: 0 .  Semaglutide,0.25 or 0.5MG/DOS, (OZEMPIC, 0.25 OR 0.5 MG/DOSE,) 2 MG/1.5ML SOPN, Inject 1 mg into the skin once a week., Disp: 12 pen, Rfl: 1 .  tamsulosin (FLOMAX) 0.4 MG CAPS capsule, TAKE ONE CAPSULE BY MOUTH DAILY AFTER SUPPER, Disp: 90 capsule, Rfl: 0  ROS  Constitutional: Denies any fever or chills Gastrointestinal: No reported hemesis, hematochezia, vomiting, or acute GI distress Musculoskeletal: Denies any acute onset joint swelling, redness, loss of ROM, or weakness Neurological: No reported episodes of acute onset apraxia, aphasia, dysarthria, agnosia, amnesia, paralysis, loss of coordination, or loss of consciousness  Allergies  Mr. Ebron has No Known Allergies.  [place]  Drug: Mr. Cueto  reports no history of drug use. Alcohol:  reports previous alcohol use. Tobacco:  reports that he has been smoking cigarettes. He has a 30.00 pack-year smoking history. He has never used smokeless tobacco. 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 Surgical: Mr. Pense  has a past surgical history that includes esophageal stretch; Tonsillectomy; Total knee arthroplasty (Right, 11/15/2014); Lumbar laminectomy/decompression microdiscectomy (Left, 02/03/2016); Colonoscopy with propofol (N/A, 09/14/2016); Joint replacement (Right, 2016); Pulse generator implant (N/A, 08/21/2017); Back surgery; Pulse generator implant (Right, 04/02/2018); and Ankle arthroscopy (Left, 09/29/2019). Family: family history includes Depression in his father; Heart attack in his mother; Heart disease in his mother; Hypertension in his father.  Constitutional Exam  General appearance: Well nourished, well developed, and well hydrated. In no apparent acute distress Vitals:   10/06/19 1359  BP: 93/63  Pulse: 82  Resp: 18  Temp: 97.7 F (36.5 C)  SpO2: 96%  Weight: 165 lb (74.8 kg)  Height: 5' 11"  (1.803 m)   BMI Assessment: Estimated body mass index is 23.01 kg/m as calculated from the following:   Height as of this encounter: 5' 11"  (1.803 m).   Weight as of this encounter: 165 lb (74.8 kg).  BMI interpretation table: BMI level Category Range association with higher incidence of chronic pain  <18 kg/m2 Underweight   18.5-24.9 kg/m2 Ideal body weight   25-29.9 kg/m2 Overweight Increased incidence by 20%  30-34.9 kg/m2 Obese (Class I) Increased incidence by 68%  35-39.9 kg/m2 Severe obesity (Class II) Increased incidence by 136%  >40 kg/m2 Extreme obesity (Class III) Increased incidence by 254%   Patient's current BMI Ideal Body weight  Body mass index is 23.01 kg/m. Ideal body weight: 75.3 kg (166 lb 0.1 oz)   BMI Readings from Last 4 Encounters:  10/06/19 23.01 kg/m  09/29/19 23.60 kg/m  08/31/19 23.15 kg/m  08/24/19 23.15 kg/m   Wt Readings from Last 4  Encounters:  10/06/19 165 lb (74.8 kg)  09/29/19 164 lb 7.4 oz (74.6 kg)  08/31/19 166 lb (75.3 kg)  08/24/19 166 lb (75.3 kg)    Psych/Mental status: Alert, oriented x 3 (person, place, & time)       Eyes: PERLA Respiratory: No evidence of acute respiratory distress  Cervical Spine Exam  Skin & Axial Inspection: No masses, redness, edema, swelling, or associated skin lesions Alignment: Symmetrical Functional ROM: Unrestricted ROM      Stability: No instability detected Muscle Tone/Strength: Functionally intact. No obvious neuro-muscular anomalies detected. Sensory (Neurological): Unimpaired Palpation: No palpable anomalies              Upper Extremity (UE) Exam    Side: Right upper extremity  Side: Left upper extremity  Skin & Extremity Inspection: Skin color, temperature, and hair growth are WNL. No peripheral edema or cyanosis. No masses, redness, swelling, asymmetry, or associated skin lesions. No contractures.  Skin & Extremity Inspection: Skin color, temperature, and hair growth are WNL. No peripheral edema or cyanosis. No masses, redness, swelling, asymmetry, or associated skin lesions. No contractures.  Functional ROM: Unrestricted ROM          Functional ROM: Unrestricted ROM          Muscle Tone/Strength: Functionally intact. No obvious neuro-muscular anomalies detected.  Muscle Tone/Strength: Functionally intact. No obvious neuro-muscular anomalies detected.  Sensory (Neurological): Unimpaired          Sensory (Neurological): Unimpaired          Palpation: No palpable anomalies  Palpation: No palpable anomalies              Provocative Test(s):  Phalen's test: deferred Tinel's test: deferred Apley's scratch test (touch opposite shoulder):  Action 1 (Across chest): deferred Action 2 (Overhead): deferred Action 3 (LB reach): deferred   Provocative Test(s):  Phalen's test: deferred Tinel's test: deferred Apley's scratch test (touch opposite shoulder):  Action  1 (Across chest): deferred Action 2 (Overhead): deferred Action 3 (LB reach): deferred    Thoracic Spine Area Exam  Skin & Axial Inspection: No masses, redness, or swelling Alignment: Symmetrical Functional ROM: Unrestricted ROM Stability: No instability detected Muscle Tone/Strength: Functionally intact. No obvious neuro-muscular anomalies detected. Sensory (Neurological): Unimpaired Muscle strength & Tone: No palpable anomalies  Lumbar Exam  Skin & Axial Inspection: Well healed scar from previous spine surgery detected Alignment: Symmetrical Functional ROM: Restricted ROM       Stability: No instability detected Muscle Tone/Strength: Functionally intact. No obvious neuro-muscular anomalies detected. Sensory (Neurological): Dermatomal pain pattern  Gait & Posture Assessment  Ambulation: Patient came in today in a wheel chair Gait: Limited. Using assistive device to ambulate Posture: Difficulty standing up straight, due to pain   Lower Extremity Exam    Side: Right lower extremity  Side: Left lower extremity  Stability: No instability observed          Stability: No instability observed          Skin & Extremity Inspection: Skin color, temperature, and hair growth are WNL. No peripheral edema or cyanosis. No masses, redness, swelling, asymmetry, or associated skin lesions. No contractures.  Skin & Extremity Inspection: Evidence of prior arthroplastic surgery  Functional ROM: Unrestricted ROM                  Functional ROM: Pain restricted ROM                  Muscle Tone/Strength: Functionally intact. No obvious neuro-muscular anomalies detected.  Muscle Tone/Strength: Functionally intact. No obvious neuro-muscular anomalies detected.  Sensory (Neurological): Unimpaired        Sensory (Neurological): Arthropathic arthralgia        DTR: Patellar: deferred today Achilles: deferred today Plantar: deferred today  DTR: Patellar: deferred today Achilles: deferred today Plantar:  deferred today  Palpation: No palpable anomalies  Palpation: No palpable anomalies   Assessment   Status Diagnosis  Controlled Controlled Controlled 1. Failed back surgical syndrome   2. Lumbosacral radiculopathy   3. Primary osteoarthritis of left ankle   4. 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 Sciatica of left side associated with disorder of lumbar spine   6. Chronic, continuous use of opioids   7. Chronic pain syndrome       Plan of Care  Pharmacotherapy (Medications Ordered): Meds ordered this encounter  Medications  . oxyCODONE-acetaminophen (PERCOCET) 10-325 MG tablet    Sig: Take 1 tablet by mouth every 6 (six) hours as needed for pain. Must last 30 days. Max #100/month    Dispense:  100 tablet    Refill:  0    Chronic Pain. (STOP Act - Not applicable). Fill one day early if closed on scheduled refill date.  Marland Kitchen oxyCODONE-acetaminophen (PERCOCET) 10-325 MG tablet    Sig: Take 1 tablet by mouth every 6 (six) hours as needed for pain. Must last 30 days. Max #100/month    Dispense:  100 tablet    Refill:  0    Chronic  Pain. (STOP Act - Not applicable). Fill one day early if closed on scheduled refill date.  Marland Kitchen oxyCODONE-acetaminophen (PERCOCET) 10-325 MG tablet    Sig: Take 1 tablet by mouth every 6 (six) hours as needed for pain. Must last 30 days. Max #100/month    Dispense:  100 tablet    Refill:  0    Chronic Pain. (STOP Act - Not applicable). Fill one day early if closed on scheduled refill date.   Medications administered today: Gonzalo L. Derrick "[name]" had no medications administered during this visit.  Orders:  Orders Placed This Encounter  Procedures  . ToxASSURE Select 13 (MW), Urine    Volume: 30 ml(s). Minimum 3 ml of urine is needed. Document temperature of fresh sample. Indications: Long term (current) use of opiate analgesic (Z79.891)    Lab Orders     ToxASSURE Select 13 (MW), Urine Planned follow-up:    Return in about 4 months (around 02/06/2020) for Medication Management, in person.   Recent Visits Date Type Provider Dept  07/09/19 Office Visit [name], MD [facility]  Showing recent visits within past 90 days and meeting all other requirements   Today's Visits Date Type Provider Dept  10/06/19 Office Visit [name], MD [facility]  Showing today's visits and meeting all other requirements   Future Appointments No visits were found meeting these conditions.  Showing future appointments within next 90 days and meeting all other requirements   Primary Care Physician: Leone Haven, MD Location: [facility] Note by: [name], MD Date: 10/06/2019; Time: 3:50 PM  Note: This dictation was prepared with Dragon dictation. Any transcriptional errors that may result from this process are unintentional.

## 2019-10-06 NOTE — Progress Notes (Signed)
Safety precautions to be maintained throughout the outpatient stay will include: orient to surroundings, keep bed in low position, maintain call bell within reach at all times, provide assistance with transfer out of bed and ambulation.   Patient did not bring medications with him today. States he has about 1/2  Remaining.

## 2019-10-09 ENCOUNTER — Other Ambulatory Visit: Payer: Self-pay

## 2019-10-09 ENCOUNTER — Ambulatory Visit (INDEPENDENT_AMBULATORY_CARE_PROVIDER_SITE_OTHER): Payer: PPO | Admitting: Physician Assistant

## 2019-10-09 ENCOUNTER — Encounter: Payer: Self-pay | Admitting: Physician Assistant

## 2019-10-09 DIAGNOSIS — M25872 Other specified joint disorders, left ankle and foot: Secondary | ICD-10-CM

## 2019-10-09 LAB — TOXASSURE SELECT 13 (MW), URINE

## 2019-10-09 NOTE — Progress Notes (Signed)
Office Visit Note   Patient: James Hood           Date of Birth: 1941/01/23           MRN: [id number] Visit Date: 10/09/2019              Requested by: Leone Haven, MD [address] [place],  [place] [address] PCP: Leone Haven, MD  No chief complaint on file.     HPI: This is a pleasant 79 year old gentleman whose 10 days status post left ankle arthroscopy.  He is has some pain but overall is doing well.  Assessment & Plan: Visit Diagnoses: No diagnosis found.  Plan: I reviewed the arthroscopy findings with the patient today.  He certainly has some arthritis in osteophytes which were debrided.  Also synovitis as well as a hemarthrosis.  He may advance activities as tolerated sutures were harvested today.  Follow-up in 4 weeks.  Follow-Up Instructions: No follow-ups on file.   Ortho Exam  Patient is alert, oriented, no adenopathy, well-dressed, normal affect, normal respiratory effort. Focused exam of his left ankle demonstrates well-healed surgical portals.  Swelling is well controlled and there are no cellulitis or erythema.  No drainage.  Ankle range of motion is 10 degrees past neutral dorsiflexion and is painless distal CMS is intact  Imaging: No results found. No images are attached to the encounter.  Labs: Lab Results  Component Value Date   HGBA1C 7.7 (H) 05/25/2019   HGBA1C 8.5 (H) 02/18/2019   HGBA1C 7.2 (H) 10/30/2018   ESRSEDRATE 15 07/20/2019   ESRSEDRATE 11 01/29/2017   ESRSEDRATE 30 (H) 06/19/2016   REPTSTATUS 04/07/2018 FINAL 04/02/2018   GRAMSTAIN NO WBC SEEN NO ORGANISMS SEEN  04/02/2018   CULT  04/02/2018    FEW PROTEUS MIRABILIS NO ANAEROBES ISOLATED Performed at [facility], [place] [address], [place], [place] [address]    LABORGA PROTEUS MIRABILIS 04/02/2018     Lab Results  Component Value Date   ALBUMIN 3.6 08/05/2019   ALBUMIN 3.6 07/20/2019   ALBUMIN 4.0 05/25/2019    No results found for:  MG Lab Results  Component Value Date   VD25OH 42.83 04/07/2019   VD25OH 24.06 (L) 03/13/2019   VD25OH 13.94 (L) 02/02/2019    No results found for: PREALBUMIN CBC EXTENDED Latest Ref Rng & Units 07/20/2019 02/02/2019 04/23/2018  WBC 4.0 - 10.5 K/uL 6.0 6.6 6.6  RBC 4.22 - 5.81 Mil/uL 3.90(L) 3.59(L) 3.35(L)  HGB 13.0 - 17.0 g/dL 12.3(L) 11.3(L) 10.5(L)  HCT 39.0 - 52.0 % 37.2(L) 33.5(L) 30.2(L)  PLT 150.0 - 400.0 K/uL 208.0 258.0 313  NEUTROABS 1.4 - 7.7 K/uL - 4.1 -  LYMPHSABS 0.7 - 4.0 K/uL - 2.0 -     There is no height or weight on file to calculate BMI.  Orders:  No orders of the defined types were placed in this encounter.  No orders of the defined types were placed in this encounter.    Procedures: No procedures performed  Clinical Data: No additional findings.  ROS:  All other systems negative, except as noted in the HPI. Review of Systems  Objective: Vital Signs: There were no vitals taken for this visit.  Specialty Comments:  No specialty comments available.  PMFS History: Patient Active Problem List   Diagnosis Date Noted  . Impingement of left ankle joint   . Sleeping difficulty 08/24/2019  . Hiccups 08/24/2019  . Weight loss 07/23/2019  . Chronic  pain of left ankle 02/27/2019  . Vitamin D deficiency 02/18/2019  . Left shoulder pain 02/02/2019  . Neuropathy 09/24/2018  . Lumbosacral radiculopathy 01/14/2018  . 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 Facet arthritis of lumbar region 01/14/2018  . Chronic pain syndrome 08/26/2017  . 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 Thyroid nodule 02/27/2017  . Fatty liver 02/27/2017  . Purpura ([place]) 01/29/2017  . Right hip pain 08/16/2016  . Lumbar herniated disc 02/03/2016  . Hypertriglyceridemia 12/09/2015  . Anemia 12/09/2015  . Falls 09/16/2015  . Barrett's esophagus 09/16/2015  . Chronic lumbar pain 09/16/2015  . Benign prostatic hyperplasia 09/16/2015  . Trochanteric bursitis of  left hip 09/09/2015  . Headache 09/09/2015  . Difficulty in walking 06/01/2015  . Amnesia 06/01/2015  . Benign essential tremor 03/10/2015  . HLD (hyperlipidemia) 02/23/2015  . DJD (degenerative joint disease) of knee 11/15/2014  . Hypertension   . Tremor, essential   . Primary localized osteoarthritis of right knee   . Anxiety and depression   . Absence of sensation 11/18/2013  . Tobacco abuse 11/18/2013  . Chronic diarrhea 11/12/2013  . Benign neoplasm of colon 08/25/2013  . 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 Type 2 diabetes mellitus ([place]) 10/06/2012   Past Medical History:  Diagnosis Date  . Anxiety   . Anxiety and depression   . Arthritis   . BPH (benign prostatic hyperplasia)   . 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 Colon polyps   . Dementia ([place])   . Depression   . Diabetes mellitus type 2 in nonobese (HCC)   . GERD (gastroesophageal reflux disease)    BARRETTS ESOPHAGUS RESOLVED PER PATIENT  . Headache   . 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[place])   . History of hiatal hernia   . Hypercholesteremia   . Hypertension   . Hyperthyroidism   . Neuropathic pain   . Primary localized osteoarthritis of right knee   . 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 Stomach ulcer   . Tremor, essential   . Wound infection complicating hardware ([place]) 04/02/2018    Family History  Problem Relation Age of Onset  . Heart attack Mother   . Heart disease Mother   . Hypertension Father   . Depression Father   . Kidney cancer Neg Hx   . Prostate cancer Neg Hx     Past Surgical History:  Procedure Laterality Date  . ANKLE ARTHROSCOPY Left 09/29/2019   Procedure: LEFT ANKLE ARTHROSCOPY, DEBRIDEMENT;  Surgeon: Newt Minion, MD;  Location: [place];  Service: Orthopedics;  Laterality: Left;  . BACK SURGERY    . COLONOSCOPY WITH PROPOFOL N/A 09/14/2016   Procedure: COLONOSCOPY WITH PROPOFOL;  Surgeon: Manya Silvas, MD;  Location:  [facility];  Service: Endoscopy;  Laterality: N/A;  . esophageal stretch    . JOINT REPLACEMENT Right 2016   knee  . LUMBAR LAMINECTOMY/DECOMPRESSION MICRODISCECTOMY Left 02/03/2016   Procedure: LEFT L5-S1 DISKECTOMY;  Surgeon: Leeroy Cha, MD;  Location: [facility];  Service: Neurosurgery;  Laterality: Left;  LEFT L5-S1 DISKECTOMY  . PULSE GENERATOR IMPLANT N/A 08/21/2017   Procedure: UNILATERAL PULSE GENERATOR IMPLANT;  Surgeon: Meade Maw, MD;  Location: [facility] ORS;  Service: Neurosurgery;  Laterality: N/A;  . PULSE GENERATOR IMPLANT Right 04/02/2018   Procedure: REMOVAL OF PULSE GENERATOR IMPLANT AND LEADS;  Surgeon: Meade Maw, MD;  Location: [facility] ORS;  Service: Neurosurgery;  Laterality: Right;  . TONSILLECTOMY    .  TOTAL KNEE ARTHROPLASTY Right 11/15/2014   Procedure: TOTAL KNEE ARTHROPLASTY;  Surgeon: Elsie Saas, MD;  Location: [place];  Service: Orthopedics;  Laterality: Right;   Social History   Occupational History  . Not on file  Tobacco Use  . Smoking status: Current Every Day Smoker    Packs/day: 0.50    Years: 60.00    Pack years: 30.00    Types: Cigarettes  . Smokeless tobacco: Never Used  Substance and Sexual Activity  . 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 Drug use: Never  . Sexual activity: Not Currently

## 2019-10-12 ENCOUNTER — Ambulatory Visit (INDEPENDENT_AMBULATORY_CARE_PROVIDER_SITE_OTHER): Payer: PPO | Admitting: Pharmacist

## 2019-10-12 DIAGNOSIS — E1142 Type 2 diabetes mellitus with diabetic polyneuropathy: Secondary | ICD-10-CM

## 2019-10-12 DIAGNOSIS — G479 Sleep disorder, unspecified: Secondary | ICD-10-CM

## 2019-10-12 NOTE — Patient Instructions (Signed)
Visit Information  Goals Addressed            This Visit's Progress     Patient Stated   . "My blood sugars are too high" (pt-stated)       Current Barriers:  . Diabetes: uncontrolled, most recent A1c 7.7% o Discussed sleep habits - has cut bedtime caffeine, but is still watching TV before bed. Sometimes has cut the TV off and listens to the radio instead  . Current antihyperglycemic regimen: Jardiance 10 mg daily, Ozempic 0.5 mg - reduced- currently using his 1 mg pen and reducing from 1 mg by 10 clicks o APPROVED for Ozempic assistance through 05/20/20 o Over income for [place] assistance . Current blood glucose readings: reports he has not been checking recently . Current meals: o Breakfast: sausage, egg, and cheese biscuit from [place]; or homemade sausage and scrambled eggs o Microwave meal (stouffer's spaghetti) o Microwave meal (stouffer's meal) o Drink: orange juice, Diet Mt Dew caffeinated -> some nights has flipped to flavored water packets . Cardiovascular risk reduction: o Antihypertensive regimen: lisinopril/HCTZ 20/25 mg daily, metoprolol succinate 25 mg daily o Antihyperlipemic regimen: rosuvastatin 40 mg daily, fenofibrate 145 mg daily, Vascepa 2 g BID; due for updated lipid panel w/ upcoming PCP appt  - Received [employer] funding for 2021 for Vascepa cost o Antiplatelet regimen: ASA 81 mg  . [place] w/ Neurology Dr. 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 however, reports he is drinking mostly Diet Mt Dew during the day. He also lays in bed and watches TV for 1-2 hours beforebd . Chronic pain: S/p ankle surgery w/ [place]. Notes that joint pain has  improved, the incisional pain is still somewhat troublesome. Dr. Holley Raring pain management; oxycodone 5/325 mg TID, sometimes QID. Also takes gabapentin 1200 mg TID, sometimes takes an extra fourth dose.  . 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 No desire to quit smoking. Recent screening CT was negative for any abnormalities  Pharmacist Clinical Goal(s):  Marland Kitchen Over the next 90 days, patient will work with PharmD and primary care provider to address optimized glycemic management  Interventions: . Comprehensive medication review performed, medication list updated in electronic medical record . Inter-disciplinary care team collaboration (see longitudinal plan of care) . Reiterated importance of BG monitoring. Patient declines to do this regularly. Will be due for A1c at next appointment w/ PCP in ~2 weeks. If A1c uncontrolled, recommend maximizing Jardiance, as Ozmepic dose is limited by previous GI upset and weight loss. . Discussed sleep hygiene. Encouraged to switch to radio at bedtime to avoid screen time, and switch to non-caffeinated beverages after noon.   Patient Self Care Activities:  . Patient will check blood glucose BID , document, and provide at future appointments . Patient will take medications as prescribed . Patient will report any questions or concerns to provider   Please see past updates related to this goal by clicking on the "Past Updates" button in the selected goal         Patient verbalizes understanding of instructions provided today.   Plan:  - Scheduled f/u call in ~ 10 weeks  Catie Darnelle Maffucci, PharmD, [place], [place] [phone]

## 2019-10-12 NOTE — Chronic Care Management (AMB) (Signed)
Chronic Care Management   Follow Up Note   10/12/2019 Name: James Hood MRN: [id number] DOB: 04-11-1941  Referred by: Leone Haven, MD Reason for referral : Chronic Care Management (Medication Management)   James Hood is a 79 y.o. year old male who is a primary care patient of Caryl Bis, Angela Adam, MD. The CCM team was consulted for assistance with chronic disease management and care coordination needs.    Contacted patient for medication management review.   Review of patient status, including review of consultants reports, relevant laboratory and other test results, and collaboration with appropriate care team members and the patient's provider was performed as part of comprehensive patient evaluation and provision of chronic care management services.    SDOH (Social Determinants of Health) assessments performed: No See Care Plan activities for detailed interventions related to [facility])     Outpatient Encounter Medications as of 10/12/2019  Medication Sig Note  . aspirin EC 81 MG tablet Take 81 mg by mouth daily.   . budesonide (ENTOCORT EC) 3 MG 24 hr capsule Take 9 mg by mouth daily.   Marland Kitchen buPROPion (WELLBUTRIN SR) 150 MG 12 hr tablet TAKE ONE TABLET BY MOUTH TWICE A DAY   . divalproex (DEPAKOTE ER) 500 MG 24 hr tablet Take 1 tablet by mouth 2 (two) times daily.   Marland Kitchen donepezil (ARICEPT) 10 MG tablet Take 10 mg by mouth at bedtime.   . fenofibrate (TRICOR) 145 MG tablet Take 1 tablet (145 mg total) by mouth daily.   . finasteride (PROSCAR) 5 MG tablet TAKE ONE TABLET BY MOUTH DAILY   . gabapentin (NEURONTIN) 600 MG tablet TAKE TWO TABLETS BY MOUTH THREE TIMES A DAY   . icosapent Ethyl (VASCEPA) 1 g capsule Take 2 capsules (2 g total) by mouth 2 (two) times daily.   Marland Kitchen JARDIANCE 10 MG TABS tablet TAKE ONE TABLET BY MOUTH DAILY BEFORE BREAKFAST   . lisinopril-hydrochlorothiazide (ZESTORETIC) 20-25 MG tablet TAKE ONE TABLET BY MOUTH TWICE A DAY   . meloxicam (MOBIC) 7.5 MG  tablet TAKE ONE TABLET BY MOUTH DAILY WITH FOOD AS NEEDED FOR PAIN   . memantine (NAMENDA) 10 MG tablet TAKE ONE TABLET BY MOUTH TWICE A DAY   . metoprolol succinate (TOPROL-XL) 25 MG 24 hr tablet Take 25 mg by mouth at bedtime.    . Multiple Vitamins-Minerals (CENTRUM SILVER PO) Take 1 tablet by mouth daily.   Marland Kitchen omeprazole (PRILOSEC) 20 MG capsule Take 20 mg by mouth 2 (two) times daily before a meal.    . [START ON 11/07/2019] oxyCODONE-acetaminophen (PERCOCET) 10-325 MG tablet Take 1 tablet by mouth every 6 (six) hours as needed for pain. Must last 30 days. Max #100/month   . primidone (MYSOLINE) 50 MG tablet Take 150-250 mg by mouth 2 (two) times daily. Take 250 mg by mouth in the morning & take 150 mg at night.   . QUEtiapine (SEROQUEL) 25 MG tablet Take 125 mg by mouth at bedtime.    . rosuvastatin (CRESTOR) 40 MG tablet TAKE ONE TABLET BY MOUTH DAILY   . Semaglutide,0.25 or 0.5MG /DOS, (OZEMPIC, 0.25 OR 0.5 MG/DOSE,) 2 MG/1.5ML SOPN Inject 1 mg into the skin once a week. 10/12/2019: Injecting 10 clicks less than 1 mg  . tamsulosin (FLOMAX) 0.4 MG CAPS capsule TAKE ONE CAPSULE BY MOUTH DAILY AFTER SUPPER   . diphenoxylate-atropine (LOMOTIL) 2.5-0.025 MG tablet Take 1 tablet by mouth 2 (two) times daily.    Derrill Memo ON 12/07/2019] oxyCODONE-acetaminophen (PERCOCET)  10-325 MG tablet Take 1 tablet by mouth every 6 (six) hours as needed for pain. Must last 30 days. Max #100/month   . [START ON 01/06/2020] oxyCODONE-acetaminophen (PERCOCET) 10-325 MG tablet Take 1 tablet by mouth every 6 (six) hours as needed for pain. Must last 30 days. Max #100/month    No facility-administered encounter medications on file as of 10/12/2019.     Objective:   Goals Addressed            This Visit's Progress     Patient Stated   . "My blood sugars are too high" (pt-stated)       Current Barriers:  . Diabetes: uncontrolled, most recent A1c 7.7% o Discussed sleep habits - has cut bedtime caffeine, but is  still watching TV before bed. Sometimes has cut the TV off and listens to the radio instead  . Current antihyperglycemic regimen: Jardiance 10 mg daily, Ozempic 0.5 mg - reduced- currently using his 1 mg pen and reducing from 1 mg by 10 clicks o APPROVED for Ozempic assistance through 05/20/20 o Over income for [place] assistance . Current blood glucose readings: reports he has not been checking recently . Current meals: o Breakfast: sausage, egg, and cheese biscuit from [place]; or homemade sausage and scrambled eggs o Microwave meal (stouffer's spaghetti) o Microwave meal (stouffer's meal) o Drink: orange juice, Diet Mt Dew caffeinated -> some nights has flipped to flavored water packets . Cardiovascular risk reduction: o Antihypertensive regimen: lisinopril/HCTZ 20/25 mg daily, metoprolol succinate 25 mg daily o Antihyperlipemic regimen: rosuvastatin 40 mg daily, fenofibrate 145 mg daily, Vascepa 2 g BID; due for updated lipid panel w/ upcoming PCP appt  - Received [employer] funding for 2021 for Vascepa cost o Antiplatelet regimen: ASA 81 mg  . [place] w/ Neurology Dr. 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 however, reports he is drinking mostly Diet Mt Dew during the day. He also lays in bed and watches TV for 1-2 hours beforebd . Chronic pain: S/p ankle surgery w/ [facility]. Notes that joint pain has improved, the incisional pain is still somewhat troublesome. Dr. Holley Raring pain management; oxycodone 5/325 mg TID, sometimes QID. Also takes gabapentin 1200 mg TID, sometimes takes an extra fourth dose.  . 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 No desire to quit smoking. Recent screening CT  was negative for any abnormalities  Pharmacist Clinical Goal(s):  Marland Kitchen Over the next 90 days, patient will work with PharmD and primary care provider to address optimized glycemic management  Interventions: . Comprehensive medication review performed, medication list updated in electronic medical record . Inter-disciplinary care team collaboration (see longitudinal plan of care) . Reiterated importance of BG monitoring. Patient declines to do this regularly. Will be due for A1c at next appointment w/ PCP in ~2 weeks. If A1c uncontrolled, recommend maximizing Jardiance, as Ozmepic dose is limited by previous GI upset and weight loss. . Discussed sleep hygiene. Encouraged to switch to radio at bedtime to avoid screen time, and switch to non-caffeinated beverages after noon.   Patient Self Care Activities:  . Patient will check blood glucose BID , document, and provide at future appointments . Patient will take medications as prescribed .  Patient will report any questions or concerns to provider   Please see past updates related to this goal by clicking on the "Past Updates" button in the selected goal          Plan:  - Scheduled f/u call in ~ 10 weeks  Catie Darnelle Maffucci, PharmD, [place], [place] Pharmacist [place] [phone]

## 2019-10-13 ENCOUNTER — Other Ambulatory Visit: Payer: Self-pay | Admitting: Family Medicine

## 2019-10-13 DIAGNOSIS — E1142 Type 2 diabetes mellitus with diabetic polyneuropathy: Secondary | ICD-10-CM

## 2019-10-14 ENCOUNTER — Other Ambulatory Visit: Payer: Self-pay | Admitting: Family Medicine

## 2019-10-14 DIAGNOSIS — E1142 Type 2 diabetes mellitus with diabetic polyneuropathy: Secondary | ICD-10-CM

## 2019-10-15 ENCOUNTER — Encounter: Payer: PPO | Admitting: Student in an Organized Health Care Education/Training Program

## 2019-10-22 ENCOUNTER — Other Ambulatory Visit: Payer: Self-pay | Admitting: Family Medicine

## 2019-10-23 ENCOUNTER — Other Ambulatory Visit: Payer: Self-pay

## 2019-10-26 ENCOUNTER — Encounter: Payer: Self-pay | Admitting: Family Medicine

## 2019-10-26 ENCOUNTER — Ambulatory Visit (INDEPENDENT_AMBULATORY_CARE_PROVIDER_SITE_OTHER): Payer: PPO | Admitting: Family Medicine

## 2019-10-26 ENCOUNTER — Other Ambulatory Visit: Payer: Self-pay | Admitting: Family Medicine

## 2019-10-26 ENCOUNTER — Other Ambulatory Visit: Payer: Self-pay

## 2019-10-26 VITALS — BP 118/70 | HR 74 | Temp 97.2°F | Ht 71.0 in | Wt 171.8 lb

## 2019-10-26 DIAGNOSIS — G479 Sleep disorder, unspecified: Secondary | ICD-10-CM | POA: Diagnosis not present

## 2019-10-26 DIAGNOSIS — E1142 Type 2 diabetes mellitus with diabetic polyneuropathy: Secondary | ICD-10-CM

## 2019-10-26 DIAGNOSIS — G250 Essential tremor: Secondary | ICD-10-CM | POA: Diagnosis not present

## 2019-10-26 DIAGNOSIS — R634 Abnormal weight loss: Secondary | ICD-10-CM | POA: Diagnosis not present

## 2019-10-26 LAB — POCT GLYCOSYLATED HEMOGLOBIN (HGB A1C): Hemoglobin A1C: 7.8 % — AB (ref 4.0–5.6)

## 2019-10-26 MED ORDER — EMPAGLIFLOZIN 25 MG PO TABS: 25.0000 mg | ORAL_TABLET | Freq: Every day | ORAL | 1 refills | Status: DC

## 2019-10-26 MED ORDER — OZEMPIC (0.25 OR 0.5 MG/DOSE) 2 MG/1.5ML ~~LOC~~ SOPN: 0.5000 mg | PEN_INJECTOR | SUBCUTANEOUS | 1 refills | Status: DC

## 2019-10-26 NOTE — Assessment & Plan Note (Signed)
Likely related to caffeine intake and TV watching.  Discussed decreasing caffeine and eliminating screen time the hour prior to going to bed.

## 2019-10-26 NOTE — Assessment & Plan Note (Signed)
Weight has trended up.  His weight loss was related to his Ozempic dose being too high.  We will continue to monitor his weight.

## 2019-10-26 NOTE — Assessment & Plan Note (Signed)
He will continue primidone.  I encouraged him to contact his neurologist to check on his morning dosing options.

## 2019-10-26 NOTE — Patient Instructions (Addendum)
Nice to see you. We are going to increase your Jardiance. Please reduce your caffeine intake further and eliminate TV the hour before getting in bed.

## 2019-10-26 NOTE — Progress Notes (Signed)
James Rumps, MD Phone: [phone]  James Hood is a 79 y.o. male who presents today for f/u.  DIABETES Disease Monitoring: Blood Sugar ranges-not checking Polyuria/phagia/dipsia- no      Optho- UTD Medications: Compliance- taking ozempic and jardiance, notes he tried to increase the ozempic dose a week ago and had lots of nausea and vomiting, he decreased the dose back to 0.5 mg and is doing well. Hypoglycemic symptoms- no  Weight loss: Weight has trended up.  This is related to his Ozempic dose being too high.  He has been eating prepackaged microwave meals and also eating some sugary food with putting cups and drinking orange juice.  Eats a midnight snack as well.  Sleep difficulty: Continues to get up 1-2 times at night.  This is typically to urinate.  He does drink [facility] though has cut down on this.  Still watching TV before bed.  Essential tremor: Currently on primidone through neurology.  He notes the neurologist cut his morning dose to 3 pills and he notes his essential tremor has worsened with this.   Social History   Tobacco Use  Smoking Status Current Every Day Smoker  . Packs/day: 0.50  . Years: 60.00  . Pack years: 30.00  . Types: Cigarettes  Smokeless Tobacco Never Used     ROS see history of present illness  Objective  Physical Exam Vitals:   10/26/19 0803  BP: 118/70  Pulse: 74  Temp: (!) 97.2 F (36.2 C)  SpO2: 96%    BP Readings from Last 3 Encounters:  10/26/19 118/70  10/06/19 93/63  09/29/19 113/73   Wt Readings from Last 3 Encounters:  10/26/19 171 lb 12.8 oz (77.9 kg)  10/06/19 165 lb (74.8 kg)  09/29/19 164 lb 7.4 oz (74.6 kg)    Physical Exam Constitutional:      General: He is not in acute distress.    Appearance: He is not diaphoretic.  Cardiovascular:     Rate and Rhythm: Normal rate and regular rhythm.     Heart sounds: Normal heart sounds.  Pulmonary:     Effort: Pulmonary effort is normal.     Breath  sounds: Normal breath sounds.  Musculoskeletal:     Right lower leg: No edema.     Left lower leg: No edema.  Skin:    General: Skin is warm and dry.  Neurological:     Mental Status: He is alert.    Diabetic Foot Exam - Simple   Simple Foot Form Diabetic Foot exam was performed with the following findings: Yes 10/26/2019  8:31 AM  Visual Inspection See comments: Yes Sensation Testing Intact to touch and monofilament testing bilaterally: Yes Pulse Check Posterior Tibialis and Dorsalis pulse intact bilaterally: Yes Comments Left dorsal proximal foot with well-healing scars from recent surgery, no signs of infection, no other deformities, ulcerations, or skin breakdown       Assessment/Plan: Please see individual problem list.  Type 2 diabetes mellitus (HCC) A1c uncontrolled.  Would plan to increase his Vania Rea though he notes that is costing him quite a bit of money.  I will check with our clinical pharmacist regarding other options.  Continue Ozempic at his current dose.  Tremor, essential He will continue primidone.  I encouraged him to contact his neurologist to check on his morning dosing options.  Weight loss Weight has trended up.  His weight loss was related to his Ozempic dose being too high.  We will continue to monitor his  weight.  Sleeping difficulty Likely related to caffeine intake and TV watching.  Discussed decreasing caffeine and eliminating screen time the hour prior to going to bed.   Spoke with clinical pharmacist. She will touch base with the patient regarding possible jardiance patient assistance.   Orders Placed This Encounter  Procedures  . POCT HgB A1C    Meds ordered this encounter  Medications  . Semaglutide,0.25 or 0.5MG /DOS, (OZEMPIC, 0.25 OR 0.5 MG/DOSE,) 2 MG/1.5ML SOPN    Sig: Inject 0.375 mLs (0.5 mg total) into the skin once a week.    Dispense:  12 pen    Refill:  1  . empagliflozin (JARDIANCE) 25 MG TABS tablet    Sig: Take 1  tablet (25 mg total) by mouth daily.    Dispense:  90 tablet    Refill:  1    This visit occurred during the SARS-CoV-2 public health emergency.  Safety protocols were in place, including screening questions prior to the visit, additional usage of staff PPE, and extensive cleaning of exam room while observing appropriate contact time as indicated for disinfecting solutions.    James Rumps, MD James Hood

## 2019-10-26 NOTE — Assessment & Plan Note (Addendum)
A1c uncontrolled.  Would plan to increase his Vania Rea though he notes that is costing him quite a bit of money.  I will check with our clinical pharmacist regarding other options.  Continue Ozempic at his current dose.

## 2019-10-29 ENCOUNTER — Ambulatory Visit: Payer: Self-pay | Admitting: Pharmacist

## 2019-10-29 DIAGNOSIS — E1142 Type 2 diabetes mellitus with diabetic polyneuropathy: Secondary | ICD-10-CM

## 2019-10-29 NOTE — Patient Instructions (Signed)
Visit Information  Goals Addressed              This Visit's Progress     Patient Stated   .  "My blood sugars are too high" (pt-stated)        Current Barriers:  . Diabetes: uncontrolled, most recent A1c 7.8% o Reported to Dr. Caryl Bis that Jardiance copay was expensive . Current antihyperglycemic regimen: Jardiance 25 mg daily (just increased), Ozempic 0.5 mg (1 mg pen and reducing from 1 mg by 10 clicks) o APPROVED for Ozempic assistance through 05/20/20 o Over income for [place] assistance . Cardiovascular risk reduction: o Antihypertensive regimen: lisinopril/HCTZ 20/25 mg daily, metoprolol succinate 25 mg daily o Antihyperlipemic regimen: rosuvastatin 40 mg daily, fenofibrate 145 mg daily, Vascepa 2 g BID;  - Received [employer] funding for 2021 for Vascepa cost o Antiplatelet regimen: ASA 81 mg  . [place] w/ Neurology Dr. 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 Chronic pain: S/p ankle surgery w/ [place]. Dr. Holley Raring pain management; oxycodone 5/325 mg TID, sometimes QID. Also takes gabapentin 1200 mg TID, sometimes takes an extra fourth dose.  . 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 No desire to quit smoking. Recent screening CT was negative for any abnormalities  Pharmacist Clinical Goal(s):  Marland Kitchen Over the next 90 days, patient will work with PharmD and primary care provider to address optimized glycemic management  Interventions: . Comprehensive medication review performed, medication list updated in electronic medical record . Inter-disciplinary care team collaboration (see longitudinal plan of care) . Reviewed current income. Patient is well over income for Jardiance assistance, as well as assistance for any other SGLT2. All other SGLT2 are also Tier 3 on insurance.  Patient's copay is not high enough for [employer].  . Reviewed other medications. No other assistance programs available, based upon his income.  Marland Kitchen He will pick up Jardiance 25 mg tablet and increase. Will f/u as scheduled.   Patient Self Care Activities:  . Patient will check blood glucose BID , document, and provide at future appointments . Patient will take medications as prescribed . Patient will report any questions or concerns to provider   Please see past updates related to this goal by clicking on the "Past Updates" button in the selected goal         Patient verbalizes understanding of instructions provided today.   Plan:  - Will f/u as previously scheduled  Catie Darnelle Maffucci, PharmD, [place], [place] Pharmacist [place] [phone]

## 2019-10-29 NOTE — Chronic Care Management (AMB) (Signed)
Chronic Care Management   Follow Up Note   10/29/2019 Name: James Hood MRN: [id number] DOB: 08/27/1940  Referred by: Leone Haven, MD Reason for referral : Chronic Care Management (Medication Management)   James Hood is a 79 y.o. year old male who is a primary care patient of Caryl Bis, Angela Adam, MD. The CCM team was consulted for assistance with chronic disease management and care coordination needs.    Contacted patient for medication access discussion.  Review of patient status, including review of consultants reports, relevant laboratory and other test results, and collaboration with appropriate care team members and the patient's provider was performed as part of comprehensive patient evaluation and provision of chronic care management services.    SDOH (Social Determinants of Health) assessments performed: Yes See Care Plan activities for detailed interventions related to [facility])     Outpatient Encounter Medications as of 10/29/2019  Medication Sig  . aspirin EC 81 MG tablet Take 81 mg by mouth daily.  . budesonide (ENTOCORT EC) 3 MG 24 hr capsule Take 9 mg by mouth daily.  Marland Kitchen buPROPion (WELLBUTRIN SR) 150 MG 12 hr tablet TAKE ONE TABLET BY MOUTH TWICE A DAY  . diphenoxylate-atropine (LOMOTIL) 2.5-0.025 MG tablet Take 1 tablet by mouth 2 (two) times daily.   . divalproex (DEPAKOTE ER) 500 MG 24 hr tablet Take 1 tablet by mouth 2 (two) times daily.  Marland Kitchen donepezil (ARICEPT) 10 MG tablet Take 10 mg by mouth at bedtime.  . empagliflozin (JARDIANCE) 25 MG TABS tablet Take 1 tablet (25 mg total) by mouth daily.  . fenofibrate (TRICOR) 145 MG tablet Take 1 tablet (145 mg total) by mouth daily.  . finasteride (PROSCAR) 5 MG tablet TAKE ONE TABLET BY MOUTH DAILY  . gabapentin (NEURONTIN) 600 MG tablet TAKE TWO TABLETS BY MOUTH THREE TIMES A DAY  . icosapent Ethyl (VASCEPA) 1 g capsule Take 2 capsules (2 g total) by mouth 2 (two) times daily.  Marland Kitchen  lisinopril-hydrochlorothiazide (ZESTORETIC) 20-25 MG tablet TAKE ONE TABLET BY MOUTH TWICE A DAY  . meloxicam (MOBIC) 7.5 MG tablet TAKE ONE TABLET BY MOUTH DAILY WITH FOOD AS NEEDED FOR PAIN  . memantine (NAMENDA) 10 MG tablet TAKE ONE TABLET BY MOUTH TWICE A DAY  . metoprolol succinate (TOPROL-XL) 25 MG 24 hr tablet Take 25 mg by mouth at bedtime.   . Multiple Vitamins-Minerals (CENTRUM SILVER PO) Take 1 tablet by mouth daily.  Marland Kitchen omeprazole (PRILOSEC) 20 MG capsule Take 20 mg by mouth 2 (two) times daily before a meal.   . [START ON 11/07/2019] oxyCODONE-acetaminophen (PERCOCET) 10-325 MG tablet Take 1 tablet by mouth every 6 (six) hours as needed for pain. Must last 30 days. Max #100/month  . [START ON 12/07/2019] oxyCODONE-acetaminophen (PERCOCET) 10-325 MG tablet Take 1 tablet by mouth every 6 (six) hours as needed for pain. Must last 30 days. Max #100/month  . [START ON 01/06/2020] oxyCODONE-acetaminophen (PERCOCET) 10-325 MG tablet Take 1 tablet by mouth every 6 (six) hours as needed for pain. Must last 30 days. Max #100/month  . primidone (MYSOLINE) 50 MG tablet Take 150-250 mg by mouth 2 (two) times daily. Take 250 mg by mouth in the morning & take 150 mg at night.  . QUEtiapine (SEROQUEL) 25 MG tablet Take 125 mg by mouth at bedtime.   . rosuvastatin (CRESTOR) 40 MG tablet TAKE ONE TABLET BY MOUTH DAILY  . Semaglutide,0.25 or 0.5MG /DOS, (OZEMPIC, 0.25 OR 0.5 MG/DOSE,) 2 MG/1.5ML SOPN Inject 0.375 mLs (0.5  mg total) into the skin once a week.  . tamsulosin (FLOMAX) 0.4 MG CAPS capsule TAKE ONE CAPSULE BY MOUTH DAILY AFTER SUPPER   No facility-administered encounter medications on file as of 10/29/2019.     Objective:   Goals Addressed              This Visit's Progress     Patient Stated   .  "My blood sugars are too high" (pt-stated)        Current Barriers:  . Diabetes: uncontrolled, most recent A1c 7.8% o Reported to Dr. Caryl Bis that Jardiance copay was expensive . Current  antihyperglycemic regimen: Jardiance 25 mg daily (just increased), Ozempic 0.5 mg (1 mg pen and reducing from 1 mg by 10 clicks) o APPROVED for Ozempic assistance through 05/20/20 o Over income for [place] assistance . Cardiovascular risk reduction: o Antihypertensive regimen: lisinopril/HCTZ 20/25 mg daily, metoprolol succinate 25 mg daily o Antihyperlipemic regimen: rosuvastatin 40 mg daily, fenofibrate 145 mg daily, Vascepa 2 g BID;  - Received [employer] funding for 2021 for Vascepa cost o Antiplatelet regimen: ASA 81 mg  . [place] w/ Neurology Dr. 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 Chronic pain: S/p ankle surgery w/ [place]. Dr. Holley Raring pain management; oxycodone 5/325 mg TID, sometimes QID. Also takes gabapentin 1200 mg TID, sometimes takes an extra fourth dose.  . 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 No desire to quit smoking. Recent screening CT was negative for any abnormalities  Pharmacist Clinical Goal(s):  Marland Kitchen Over the next 90 days, patient will work with PharmD and primary care provider to address optimized glycemic management  Interventions: . Comprehensive medication review performed, medication list updated in electronic medical record . Inter-disciplinary care team collaboration (see longitudinal plan of care) . Reviewed current income. Patient is well over income for Jardiance assistance, as well as assistance for any other SGLT2. All other SGLT2 are also Tier 3 on insurance. Patient's copay is not high enough for [employer].  . Reviewed other medications. No other assistance programs available, based upon his income.  Marland Kitchen He will pick up Jardiance 25 mg tablet and increase. Will f/u as scheduled.   Patient Self Care Activities:  . Patient will check blood glucose  BID , document, and provide at future appointments . Patient will take medications as prescribed . Patient will report any questions or concerns to provider   Please see past updates related to this goal by clicking on the "Past Updates" button in the selected goal          Plan:  - Will f/u as previously scheduled  Catie Darnelle Maffucci, PharmD, [place], [place] Pharmacist [address] [place] [phone]

## 2019-10-31 ENCOUNTER — Other Ambulatory Visit: Payer: Self-pay | Admitting: Family Medicine

## 2019-10-31 DIAGNOSIS — E782 Mixed hyperlipidemia: Secondary | ICD-10-CM

## 2019-11-05 ENCOUNTER — Other Ambulatory Visit: Payer: Self-pay | Admitting: Family Medicine

## 2019-11-05 DIAGNOSIS — R262 Difficulty in walking, not elsewhere classified: Secondary | ICD-10-CM | POA: Diagnosis not present

## 2019-11-05 DIAGNOSIS — R251 Tremor, unspecified: Secondary | ICD-10-CM | POA: Diagnosis not present

## 2019-11-05 DIAGNOSIS — R4189 Other symptoms and signs involving cognitive functions and awareness: Secondary | ICD-10-CM | POA: Diagnosis not present

## 2019-11-05 DIAGNOSIS — G479 Sleep disorder, unspecified: Secondary | ICD-10-CM | POA: Diagnosis not present

## 2019-11-05 DIAGNOSIS — Z720 Tobacco use: Secondary | ICD-10-CM | POA: Diagnosis not present

## 2019-11-06 ENCOUNTER — Ambulatory Visit (INDEPENDENT_AMBULATORY_CARE_PROVIDER_SITE_OTHER): Payer: PPO | Admitting: Family

## 2019-11-06 ENCOUNTER — Encounter: Payer: Self-pay | Admitting: Family

## 2019-11-06 ENCOUNTER — Other Ambulatory Visit: Payer: Self-pay

## 2019-11-06 VITALS — Ht 71.0 in | Wt 171.0 lb

## 2019-11-06 DIAGNOSIS — M25872 Other specified joint disorders, left ankle and foot: Secondary | ICD-10-CM

## 2019-11-06 NOTE — Progress Notes (Signed)
Office Visit Note   Patient: James Hood           Date of Birth: 05-Jun-1940           MRN: [id number] Visit Date: 11/06/2019              Requested by: Leone Haven, MD [address] [place],  [place] [address] PCP: Leone Haven, MD  Chief Complaint  Patient presents with  . Left Ankle - Routine Post Op    09/29/19 left ankle scope and debridement       HPI: This is a pleasant 79 year old gentleman status post left ankle arthroscopy.  He is has some pain but overall is doing well.  He states he no longer has the pain that he had prior to his arthroscopy.  He does continue to have some joint pain this is different feels it is slowly improving week by week.  Assessment & Plan: Visit Diagnoses:  1. Impingement of left ankle joint     Plan: He will continue to slowly advance his activities as he tolerates.  Work on range of motion of the ankle.  He will follow-up in the office if he fails to improve as he expects.  Follow-Up Instructions: Return in about 4 weeks (around 12/04/2019), or if symptoms worsen or fail to improve.   Ortho Exam  Patient is alert, oriented, no adenopathy, well-dressed, normal affect, normal respiratory effort. Focused exam of his left ankle demonstrates well-healed surgical portals.  Swelling is well controlled and there are no cellulitis or erythema.  No drainage.  Ankle range of motion is 10 degrees past neutral dorsiflexion and is painless distal CMS is intact  Imaging: No results found. No images are attached to the encounter.  Labs: Lab Results  Component Value Date   HGBA1C 7.8 (A) 10/26/2019   HGBA1C 7.7 (H) 05/25/2019   HGBA1C 8.5 (H) 02/18/2019   ESRSEDRATE 15 07/20/2019   ESRSEDRATE 11 01/29/2017   ESRSEDRATE 30 (H) 06/19/2016   REPTSTATUS 04/07/2018 FINAL 04/02/2018   GRAMSTAIN NO WBC SEEN NO ORGANISMS SEEN  04/02/2018   CULT  04/02/2018    FEW PROTEUS MIRABILIS NO ANAEROBES ISOLATED Performed at [facility], [place] [address], [place], [place] [address]    LABORGA PROTEUS MIRABILIS 04/02/2018     Lab Results  Component Value Date   ALBUMIN 3.6 08/05/2019   ALBUMIN 3.6 07/20/2019   ALBUMIN 4.0 05/25/2019    No results found for: MG Lab Results  Component Value Date   VD25OH 42.83 04/07/2019   VD25OH 24.06 (L) 03/13/2019   VD25OH 13.94 (L) 02/02/2019    No results found for: PREALBUMIN CBC EXTENDED Latest Ref Rng & Units 07/20/2019 02/02/2019 04/23/2018  WBC 4.0 - 10.5 K/uL 6.0 6.6 6.6  RBC 4.22 - 5.81 Mil/uL 3.90(L) 3.59(L) 3.35(L)  HGB 13.0 - 17.0 g/dL 12.3(L) 11.3(L) 10.5(L)  HCT 39 - 52 % 37.2(L) 33.5(L) 30.2(L)  PLT 150 - 400 K/uL 208.0 258.0 313  NEUTROABS 1.4 - 7.7 K/uL - 4.1 -  LYMPHSABS 0.7 - 4.0 K/uL - 2.0 -     Body mass index is 23.85 kg/m.  Orders:  No orders of the defined types were placed in this encounter.  No orders of the defined types were placed in this encounter.    Procedures: No procedures performed  Clinical Data: No additional findings.  ROS:  All other systems negative, except as noted in the HPI. Review of Systems  Constitutional: Negative for chills and fever.  Musculoskeletal: Positive for arthralgias. Negative for joint swelling and myalgias.    Objective: Vital Signs: Ht 5\' 11"  (1.803 m)   Wt 171 lb (77.6 kg)   BMI 23.85 kg/m   Specialty Comments:  No specialty comments available.  PMFS History: Patient Active Problem List   Diagnosis Date Noted  . Impingement of left ankle joint   . Sleeping difficulty 08/24/2019  . Hiccups 08/24/2019  . Chronic pain of left ankle 02/27/2019  . Vitamin D deficiency 02/18/2019  . Left shoulder pain 02/02/2019  . Neuropathy 09/24/2018  . Lumbosacral radiculopathy 01/14/2018  . 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 Facet arthritis of lumbar region 01/14/2018  . Chronic pain syndrome 08/26/2017  . 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 Thyroid nodule  02/27/2017  . Fatty liver 02/27/2017  . Purpura ([place]) 01/29/2017  . Right hip pain 08/16/2016  . Lumbar herniated disc 02/03/2016  . Hypertriglyceridemia 12/09/2015  . Anemia 12/09/2015  . Falls 09/16/2015  . Barrett's esophagus 09/16/2015  . Chronic lumbar pain 09/16/2015  . Benign prostatic hyperplasia 09/16/2015  . Trochanteric bursitis of left hip 09/09/2015  . Headache 09/09/2015  . Difficulty in walking 06/01/2015  . Amnesia 06/01/2015  . HLD (hyperlipidemia) 02/23/2015  . DJD (degenerative joint disease) of knee 11/15/2014  . Hypertension   . Tremor, essential   . Primary localized osteoarthritis of right knee   . Anxiety and depression   . Absence of sensation 11/18/2013  . Tobacco abuse 11/18/2013  . Chronic diarrhea 11/12/2013  . Benign neoplasm of colon 08/25/2013  . 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 Type 2 diabetes mellitus ([place]) 10/06/2012   Past Medical History:  Diagnosis Date  . Anxiety   . Anxiety and depression   . Arthritis   . BPH (benign prostatic hyperplasia)   . 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 Colon polyps   . Dementia ([place])   . Depression   . Diabetes mellitus type 2 in nonobese (HCC)   . GERD (gastroesophageal reflux disease)    BARRETTS ESOPHAGUS RESOLVED PER PATIENT  . Headache   . 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[place])   . History of hiatal hernia   . Hypercholesteremia   . Hypertension   . Hyperthyroidism   . Neuropathic pain   . Primary localized osteoarthritis of right knee   . 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 Stomach ulcer   . Tremor, essential   . Wound infection complicating hardware ([place]) 04/02/2018    Family History  Problem Relation Age of Onset  . Heart attack Mother   . Heart disease Mother   . Hypertension Father   . Depression Father   . Kidney cancer Neg Hx   . Prostate cancer Neg Hx     Past Surgical History:  Procedure Laterality Date  . ANKLE ARTHROSCOPY Left  09/29/2019   Procedure: LEFT ANKLE ARTHROSCOPY, DEBRIDEMENT;  Surgeon: Newt Minion, MD;  Location: [facility];  Service: Orthopedics;  Laterality: Left;  . BACK SURGERY    . COLONOSCOPY WITH PROPOFOL N/A 09/14/2016   Procedure: COLONOSCOPY WITH PROPOFOL;  Surgeon: Manya Silvas, MD;  [facility] ENDOSCOPY;  Service: Endoscopy;  Laterality: N/A;  . esophageal stretch    . JOINT REPLACEMENT Right 2016   knee  . LUMBAR LAMINECTOMY/DECOMPRESSION MICRODISCECTOMY Left 02/03/2016   Procedure: LEFT L5-S1 DISKECTOMY;  Surgeon: Leeroy Cha, MD;  Location: [facility] NEURO ORS;  Service: Neurosurgery;  Laterality: Left;  LEFT L5-S1 DISKECTOMY  . PULSE GENERATOR IMPLANT N/A 08/21/2017   Procedure: UNILATERAL PULSE GENERATOR IMPLANT;  Surgeon: Meade Maw, MD;  Location: [facility] ORS;  Service: Neurosurgery;  Laterality: N/A;  . PULSE GENERATOR IMPLANT Right 04/02/2018   Procedure: REMOVAL OF PULSE GENERATOR IMPLANT AND LEADS;  Surgeon: Meade Maw, MD;  Location: [facility] ORS;  Service: Neurosurgery;  Laterality: Right;  . TONSILLECTOMY    . TOTAL KNEE ARTHROPLASTY Right 11/15/2014   Procedure: TOTAL KNEE ARTHROPLASTY;  Surgeon: Elsie Saas, MD;  Location: [facility];  Service: Orthopedics;  Laterality: Right;   Social History   Occupational History  . Not on file  Tobacco Use  . Smoking status: Current Every Day Smoker    Packs/day: 0.50    Years: 60.00    Pack years: 30.00    Types: Cigarettes  . Smokeless tobacco: Never Used  Vaping Use  . Vaping Use: Some days  Substance and Sexual Activity  . 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 Drug use: Never  . Sexual activity: Not Currently

## 2019-11-13 ENCOUNTER — Telehealth: Payer: Self-pay | Admitting: Family Medicine

## 2019-11-13 NOTE — Telephone Encounter (Signed)
Patient would like to have a prescription for Dexcom G6 device.

## 2019-11-13 NOTE — Telephone Encounter (Signed)
I don't think Medicare will cover this device. They require that patients be on insulin and have to be on a certain number of injections to receive this type of device.

## 2019-11-17 DIAGNOSIS — W57XXXA Bitten or stung by nonvenomous insect and other nonvenomous arthropods, initial encounter: Secondary | ICD-10-CM | POA: Diagnosis not present

## 2019-11-17 DIAGNOSIS — S90561A Insect bite (nonvenomous), right ankle, initial encounter: Secondary | ICD-10-CM | POA: Diagnosis not present

## 2019-11-17 DIAGNOSIS — L03115 Cellulitis of right lower limb: Secondary | ICD-10-CM | POA: Diagnosis not present

## 2019-11-17 NOTE — Telephone Encounter (Signed)
I spoke with the patient and informed him that his insurance would not pay for the Dexcom device and he understood.  I did tell him a little about the [place], we had a small meeting on it and if his insurance won't pay for that his out of pocket expense would only be $75 he stated he could handle that so he would like to get the [place] if you think it would be a good idea.  Kaydyn Chism,cma

## 2019-11-17 NOTE — Telephone Encounter (Signed)
I called and LVM for the patient to call back to discuss the [facility] ([facility] device.  James Hood,cma

## 2019-11-20 DIAGNOSIS — L03115 Cellulitis of right lower limb: Secondary | ICD-10-CM | POA: Diagnosis not present

## 2019-11-20 MED ORDER — FREESTYLE LIBRE 14 DAY SENSOR MISC: 3 refills | Status: DC

## 2019-11-20 MED ORDER — FREESTYLE LIBRE 14 DAY READER DEVI: 0 refills | Status: DC

## 2019-11-20 NOTE — Telephone Encounter (Signed)
Sent to pharmacy 

## 2019-11-20 NOTE — Addendum Note (Signed)
Addended by: Leone Haven on: 11/20/2019 03:53 PM   Modules accepted: Orders

## 2019-11-20 NOTE — Telephone Encounter (Signed)
See prior note

## 2019-12-01 ENCOUNTER — Ambulatory Visit (INDEPENDENT_AMBULATORY_CARE_PROVIDER_SITE_OTHER): Payer: PPO

## 2019-12-01 VITALS — Ht 71.0 in | Wt 180.0 lb

## 2019-12-01 DIAGNOSIS — Z0000 Encounter for general adult medical examination without abnormal findings: Secondary | ICD-10-CM

## 2019-12-01 NOTE — Patient Instructions (Addendum)
[name] , Thank you for taking time to come for your Medicare Wellness Visit. I appreciate your ongoing commitment to your health goals. Please review the following plan we discussed and let me know if I can assist you in the future.   These are the goals we discussed: Goals      Patient Stated   .  "My blood sugars are too high" (pt-stated)      Current Barriers:  . Diabetes: uncontrolled, most recent A1c 7.8% o Reported to Dr. Caryl Hood that Jardiance copay was expensive . Current antihyperglycemic regimen: Jardiance 25 mg daily (just increased), Ozempic 0.5 mg (1 mg pen and reducing from 1 mg by 10 clicks) o APPROVED for Ozempic assistance through 05/20/20 o Over income for [place] assistance . Cardiovascular risk reduction: o Antihypertensive regimen: lisinopril/HCTZ 20/25 mg daily, metoprolol succinate 25 mg daily o Antihyperlipemic regimen: rosuvastatin 40 mg daily, fenofibrate 145 mg daily, Vascepa 2 g BID;  - Received [employer] funding for 2021 for Vascepa cost o Antiplatelet regimen: ASA 81 mg  . James Hood w/ Neurology Dr. 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 Chronic pain: S/p ankle surgery w/ James Hood. Dr. Holley Hood pain management; oxycodone 5/325 mg TID, sometimes QID. Also takes gabapentin 1200 mg TID, sometimes takes an extra fourth dose.  . 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 No desire to quit smoking. Recent screening CT was negative for any abnormalities  Pharmacist Clinical Goal(s):  Marland Kitchen Over the next 90 days, patient will work with PharmD and primary care provider to address optimized glycemic management  Interventions: . Comprehensive medication review performed, medication list updated in electronic medical record . Inter-disciplinary care team collaboration  (see longitudinal plan of care) . Reviewed current income. Patient is well over income for Jardiance assistance, as well as assistance for any other SGLT2. All other SGLT2 are also Tier 3 on insurance. Patient's copay is not high enough for [employer].  . Reviewed other medications. No other assistance programs available, based upon his income.  Marland Kitchen He will pick up Jardiance 25 mg tablet and increase. Will f/u as scheduled.   Patient Self Care Activities:  . Patient will check blood glucose BID , document, and provide at future appointments . Patient will take medications as prescribed . Patient will report any questions or concerns to provider   Please see past updates related to this goal by clicking on the "Past Updates" button in the selected goal      .  Increase physical activity (pt-stated)      Upper and lower body strengthening exercises    .  Quit Smoking (pt-stated)       This is a list of the screening recommended for you and due dates:  Health Maintenance  Topic Date Due  .  Hepatitis C: One time screening is recommended by Center for Disease Control  (CDC) for  adults born from [birth year] through 1966.   Never done  . Flu Shot  12/20/2019  . Hemoglobin A1C  04/26/2020  . Eye exam for diabetics  05/24/2020  . Complete foot exam   10/25/2020  . Tetanus Vaccine  01/19/2028  . COVID-19 Vaccine  Completed  . Pneumonia vaccines  Completed    Immunizations Immunization History  Administered Date(s) Administered  . Hepatitis A,  Adult 12/02/2018  . Influenza, High Dose Seasonal PF 01/29/2017, 03/19/2018, 01/10/2019  . Influenza,inj,Quad PF,6+ Mos 03/13/2016  . Influenza-Unspecified 01/21/2015  . PFIZER SARS-COV-2 Vaccination 06/01/2019, 06/22/2019  . Pneumococcal Conjugate-13 02/07/2015  . Pneumococcal Polysaccharide-23 01/18/2018  . Tdap 09/16/2015, 01/18/2018  . Zoster 09/16/2015  . Zoster Recombinat (Shingrix) 01/03/2018, 05/09/2018   Keep all routine maintenance  appointments.   CCM Telephone 12/21/19 @ 2:30  Follow up 02/01/20 @ 8:00  Advanced directives: on file  Conditions/risks identified: none new  Follow up in one year for your annual wellness visit.   Preventive Care 80 Years and Older, Male Preventive care refers to lifestyle choices and visits with your health care provider that can promote health and wellness. What does preventive care include?  A yearly physical exam. This is also called an annual well check.  Dental exams once or twice a year.  Routine eye exams. Ask your health care provider how often you should have your eyes checked.  Personal lifestyle choices, including:  Daily care of your teeth and gums.  Regular physical activity.  Eating a healthy diet.  Avoiding tobacco and drug use.  Limiting alcohol use.  Practicing safe sex.  Taking low doses of aspirin every day.  Taking vitamin and mineral supplements as recommended by your health care provider. What happens during an annual well check? The services and screenings done by your health care provider during your annual well check will depend on your age, overall health, lifestyle risk factors, and family history of disease. Counseling  Your health care provider may ask you questions about your:  Alcohol use.  Tobacco use.  Drug use.  Emotional well-being.  Home and relationship well-being.  Sexual activity.  Eating habits.  History of falls.  Memory and ability to understand (cognition).  Work and work Statistician. Screening  You may have the following tests or measurements:  Height, weight, and BMI.  Blood pressure.  Lipid and cholesterol levels. These may be checked every 5 years, or more frequently if you are over 36 years old.  Skin check.  Lung cancer screening. You may have this screening every year starting at age 87 if you have a 30-pack-year history of smoking and currently smoke or have quit within the past 15  years.  Fecal occult blood test (FOBT) of the stool. You may have this test every year starting at age 41.  Flexible sigmoidoscopy or colonoscopy. You may have a sigmoidoscopy every 5 years or a colonoscopy every 10 years starting at age 14.  Prostate cancer screening. Recommendations will vary depending on your family history and other risks.  Hepatitis C blood test.  Hepatitis B blood test.  Sexually transmitted disease (STD) testing.  Diabetes screening. This is done by checking your blood sugar (glucose) after you have not eaten for a while (fasting). You may have this done every 1-3 years.  Abdominal aortic aneurysm (AAA) screening. You may need this if you are a current or former smoker.  Osteoporosis. You may be screened starting at age 80 if you are at high risk. Talk with your health care provider about your test results, treatment options, and if necessary, the need for more tests. Vaccines  Your health care provider may recommend certain vaccines, such as:  Influenza vaccine. This is recommended every year.  Tetanus, diphtheria, and acellular pertussis (Tdap, Td) vaccine. You may need a Td booster every 10 years.  Zoster vaccine. You may need this after age 38.  Pneumococcal 13-valent conjugate (  PCV13) vaccine. One dose is recommended after age 78.  Pneumococcal polysaccharide (PPSV23) vaccine. One dose is recommended after age 27. Talk to your health care provider about which screenings and vaccines you need and how often you need them. This information is not intended to replace advice given to you by your health care provider. Make sure you discuss any questions you have with your health care provider. Document Released: 06/03/2015 Document Revised: 01/25/2016 Document Reviewed: 03/08/2015 Elsevier Interactive Patient Education  2017 [place] Prevention in the Home Falls can cause injuries. They can happen to people of all ages. There are many things  you can do to make your home safe and to help prevent falls. What can I do on the outside of my home?  Regularly fix the edges of walkways and driveways and fix any cracks.  Remove anything that might make you trip as you walk through a door, such as a raised step or threshold.  Trim any bushes or trees on the path to your home.  Use bright outdoor lighting.  Clear any walking paths of anything that might make someone trip, such as rocks or tools.  Regularly check to see if handrails are loose or broken. Make sure that both sides of any steps have handrails.  Any raised decks and porches should have guardrails on the edges.  Have any leaves, snow, or ice cleared regularly.  Use sand or salt on walking paths during winter.  Clean up any spills in your garage right away. This includes oil or grease spills. What can I do in the bathroom?  Use night lights.  Install grab bars by the toilet and in the tub and shower. Do not use towel bars as grab bars.  Use non-skid mats or decals in the tub or shower.  If you need to sit down in the shower, use a plastic, non-slip stool.  Keep the floor dry. Clean up any water that spills on the floor as soon as it happens.  Remove soap buildup in the tub or shower regularly.  Attach bath mats securely with double-sided non-slip rug tape.  Do not have throw rugs and other things on the floor that can make you trip. What can I do in the bedroom?  Use night lights.  Make sure that you have a light by your bed that is easy to reach.  Do not use any sheets or blankets that are too big for your bed. They should not hang down onto the floor.  Have a firm chair that has side arms. You can use this for support while you get dressed.  Do not have throw rugs and other things on the floor that can make you trip. What can I do in the kitchen?  Clean up any spills right away.  Avoid walking on wet floors.  Keep items that you use a lot in  easy-to-reach places.  If you need to reach something above you, use a strong step stool that has a grab bar.  Keep electrical cords out of the way.  Do not use floor polish or wax that makes floors slippery. If you must use wax, use non-skid floor wax.  Do not have throw rugs and other things on the floor that can make you trip. What can I do with my stairs?  Do not leave any items on the stairs.  Make sure that there are handrails on both sides of the stairs and use them. Fix handrails that  are broken or loose. Make sure that handrails are as long as the stairways.  Check any carpeting to make sure that it is firmly attached to the stairs. Fix any carpet that is loose or worn.  Avoid having throw rugs at the top or bottom of the stairs. If you do have throw rugs, attach them to the floor with carpet tape.  Make sure that you have a light switch at the top of the stairs and the bottom of the stairs. If you do not have them, ask someone to add them for you. What else can I do to help prevent falls?  Wear shoes that:  Do not have high heels.  Have rubber bottoms.  Are comfortable and fit you well.  Are closed at the toe. Do not wear sandals.  If you use a stepladder:  Make sure that it is fully opened. Do not climb a closed stepladder.  Make sure that both sides of the stepladder are locked into place.  Ask someone to hold it for you, if possible.  Clearly mark and make sure that you can see:  Any grab bars or handrails.  First and last steps.  Where the edge of each step is.  Use tools that help you move around (mobility aids) if they are needed. These include:  Canes.  Walkers.  Scooters.  Crutches.  Turn on the lights when you go into a dark area. Replace any light bulbs as soon as they burn out.  Set up your furniture so you have a clear path. Avoid moving your furniture around.  If any of your floors are uneven, fix them.  If there are any pets  around you, be aware of where they are.  Review your medicines with your doctor. Some medicines can make you feel dizzy. This can increase your chance of falling. Ask your doctor what other things that you can do to help prevent falls. This information is not intended to replace advice given to you by your health care provider. Make sure you discuss any questions you have with your health care provider. Document Released: 03/03/2009 Document Revised: 10/13/2015 Document Reviewed: 06/11/2014 Elsevier Interactive Patient Education  2017 [employer].

## 2019-12-01 NOTE — Progress Notes (Signed)
Subjective:   James Hood is a 79 y.o. male who presents for Medicare Annual/Subsequent preventive examination.  Review of Systems    No ROS.  Medicare Wellness Virtual Visit.       Objective:    Today's Vitals   12/01/19 0939  Weight: 180 lb (81.6 kg)  Height: 5\' 11"  (1.803 m)   Body mass index is 25.1 kg/m.  Advanced Directives 12/01/2019 09/29/2019 09/23/2019 11/28/2018 04/15/2018 04/02/2018 03/31/2018  Does Patient Have a Medical Advance Directive? Yes Yes Yes Yes Yes Yes Yes  Type of Paramedic of [place];Living will Living will;Healthcare Power of Attorney Living will;Healthcare Power of [place];Living will [place];Living will Healthcare Power of [place];Living will  Does patient want to make changes to medical advance directive? No - Patient declined - No - Patient declined No - Patient declined - No - Patient declined No - Patient declined  Copy of [place] in Chart? Yes - validated most recent copy scanned in chart (See row information) - No - copy requested Yes - validated most recent copy scanned in chart (See row information) Yes - validated most recent copy scanned in chart (See row information) No - copy requested No - copy requested    Current Medications (verified) Outpatient Encounter Medications as of 12/01/2019  Medication Sig  . aspirin EC 81 MG tablet Take 81 mg by mouth daily.  . budesonide (ENTOCORT EC) 3 MG 24 hr capsule Take 9 mg by mouth daily.  Marland Kitchen buPROPion (WELLBUTRIN SR) 150 MG 12 hr tablet TAKE ONE TABLET BY MOUTH TWICE A DAY  . Continuous Blood Gluc Receiver (FREESTYLE LIBRE 14 DAY READER) DEVI Use daily as directed to check glucose.  . Continuous Blood Gluc Sensor (FREESTYLE LIBRE 14 DAY SENSOR) MISC Used daily as directed to check glucose. ICD10 E11.9  . diphenoxylate-atropine (LOMOTIL) 2.5-0.025 MG tablet Take 1 tablet by  mouth 2 (two) times daily.   . divalproex (DEPAKOTE ER) 500 MG 24 hr tablet Take 1 tablet by mouth 2 (two) times daily.  Marland Kitchen donepezil (ARICEPT) 10 MG tablet Take 10 mg by mouth at bedtime.  . empagliflozin (JARDIANCE) 25 MG TABS tablet Take 1 tablet (25 mg total) by mouth daily.  . fenofibrate (TRICOR) 145 MG tablet Take 1 tablet (145 mg total) by mouth daily.  . finasteride (PROSCAR) 5 MG tablet TAKE ONE TABLET BY MOUTH DAILY  . gabapentin (NEURONTIN) 600 MG tablet TAKE TWO TABLETS BY MOUTH THREE TIMES A DAY  . icosapent Ethyl (VASCEPA) 1 g capsule Take 2 capsules (2 g total) by mouth 2 (two) times daily.  Marland Kitchen lisinopril-hydrochlorothiazide (ZESTORETIC) 20-25 MG tablet TAKE ONE TABLET BY MOUTH TWICE A DAY  . meloxicam (MOBIC) 7.5 MG tablet TAKE ONE TABLET BY MOUTH DAILY WITH FOOD AS NEEDED FOR PAIN  . memantine (NAMENDA) 10 MG tablet TAKE ONE TABLET BY MOUTH TWICE A DAY  . metoprolol succinate (TOPROL-XL) 25 MG 24 hr tablet Take 25 mg by mouth at bedtime.   . Multiple Vitamins-Minerals (CENTRUM SILVER PO) Take 1 tablet by mouth daily.  Marland Kitchen omeprazole (PRILOSEC) 20 MG capsule Take 20 mg by mouth 2 (two) times daily before a meal.   . oxyCODONE-acetaminophen (PERCOCET) 10-325 MG tablet Take 1 tablet by mouth every 6 (six) hours as needed for pain. Must last 30 days. Max #100/month  . [START ON 12/07/2019] oxyCODONE-acetaminophen (PERCOCET) 10-325 MG tablet Take 1 tablet by mouth  every 6 (six) hours as needed for pain. Must last 30 days. Max #100/month  . [START ON 01/06/2020] oxyCODONE-acetaminophen (PERCOCET) 10-325 MG tablet Take 1 tablet by mouth every 6 (six) hours as needed for pain. Must last 30 days. Max #100/month  . primidone (MYSOLINE) 50 MG tablet Take 150-250 mg by mouth 2 (two) times daily. Take 250 mg by mouth in the morning & take 150 mg at night.  . QUEtiapine (SEROQUEL) 25 MG tablet Take 125 mg by mouth at bedtime.   . rosuvastatin (CRESTOR) 40 MG tablet TAKE ONE TABLET BY MOUTH DAILY  .  Semaglutide,0.25 or 0.5MG /DOS, (OZEMPIC, 0.25 OR 0.5 MG/DOSE,) 2 MG/1.5ML SOPN Inject 0.375 mLs (0.5 mg total) into the skin once a week.  . tamsulosin (FLOMAX) 0.4 MG CAPS capsule TAKE ONE CAPSULE BY MOUTH DAILY AFTER SUPPER   No facility-administered encounter medications on file as of 12/01/2019.    Allergies (verified) Patient has no known allergies.   History: Past Medical History:  Diagnosis Date  . Anxiety   . Anxiety and depression   . Arthritis   . BPH (benign prostatic hyperplasia)   . 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 Colon polyps   . Dementia ([place])   . Depression   . Diabetes mellitus type 2 in nonobese (HCC)   . GERD (gastroesophageal reflux disease)    BARRETTS ESOPHAGUS RESOLVED PER PATIENT  . Headache   . 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 History of hiatal hernia   . Hypercholesteremia   . Hypertension   . Hyperthyroidism   . Neuropathic pain   . Primary localized osteoarthritis of right knee   . 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 Stomach ulcer   . Tremor, essential   . Wound infection complicating hardware ([place]) 04/02/2018   Past Surgical History:  Procedure Laterality Date  . ANKLE ARTHROSCOPY Left 09/29/2019   Procedure: LEFT ANKLE ARTHROSCOPY, DEBRIDEMENT;  Surgeon: Newt Minion, MD;  Location: [facility];  Service: Orthopedics;  Laterality: Left;  . BACK SURGERY    . COLONOSCOPY WITH PROPOFOL N/A 09/14/2016   Procedure: COLONOSCOPY WITH PROPOFOL;  Surgeon: Manya Silvas, MD;  [facility] ENDOSCOPY;  Service: Endoscopy;  Laterality: N/A;  . esophageal stretch    . JOINT REPLACEMENT Right 2016   knee  . LUMBAR LAMINECTOMY/DECOMPRESSION MICRODISCECTOMY Left 02/03/2016   Procedure: LEFT L5-S1 DISKECTOMY;  Surgeon: Leeroy Cha, MD;  Location: [facility] NEURO ORS;  Service: Neurosurgery;  Laterality: Left;  LEFT L5-S1 DISKECTOMY  . PULSE GENERATOR IMPLANT N/A 08/21/2017   Procedure:  UNILATERAL PULSE GENERATOR IMPLANT;  Surgeon: Meade Maw, MD;  Location: [facility] ORS;  Service: Neurosurgery;  Laterality: N/A;  . PULSE GENERATOR IMPLANT Right 04/02/2018   Procedure: REMOVAL OF PULSE GENERATOR IMPLANT AND LEADS;  Surgeon: Meade Maw, MD;  Location: [facility] ORS;  Service: Neurosurgery;  Laterality: Right;  . TONSILLECTOMY    . TOTAL KNEE ARTHROPLASTY Right 11/15/2014   Procedure: TOTAL KNEE ARTHROPLASTY;  Surgeon: Elsie Saas, MD;  Location: [facility];  Service: Orthopedics;  Laterality: Right;   Family History  Problem Relation Age of Onset  . Heart attack Mother   . Heart disease Mother   . Hypertension Father   . Depression Father   . Kidney cancer Neg Hx   . Prostate cancer Neg Hx    Social History   Socioeconomic History  . Marital status: Married    Spouse name: Not on file  .  Number of children: Not on file  . Years of education: Not on file  . Highest education level: Not on file  Occupational History  . Not on file  Tobacco Use  . Smoking status: Current Every Day Smoker    Packs/day: 0.50    Years: 60.00    Pack years: 30.00    Types: Cigarettes  . Smokeless tobacco: Never Used  Vaping Use  . Vaping Use: Some days  Substance and Sexual Activity  . 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 Drug use: Never  . Sexual activity: Not Currently  Other Topics Concern  . Not on file  Social History Narrative  . Not on file   Social Determinants of Health   Financial Resource Strain:   . Difficulty of Paying Living Expenses:   Food Insecurity:   . Worried About Charity fundraiser in the Last Year:   . Arboriculturist in the Last Year:   Transportation Needs:   . Film/video editor (Medical):   Marland Kitchen Lack of Transportation (Non-Medical):   Physical Activity:   . Days of Exercise per Week:   . Minutes of Exercise per Session:   Stress:   . Feeling of Stress :     Social Connections:   . Frequency of Communication with Friends and Family:   . Frequency of Social Gatherings with Friends and Family:   . Attends Religious Services:   . Active Member of Clubs or Organizations:   . Attends Archivist Meetings:   Marland Kitchen Marital Status:     Tobacco Counseling Ready to quit: Not Answered Counseling given: Not Answered   Clinical Intake:  Pre-visit preparation completed: Yes           How often do you need to have someone help you when you read instructions, pamphlets, or other written materials from your doctor or pharmacy?: 1 - Never   Interpreter Needed?: No      Activities of Daily Living In your present state of health, do you have any difficulty performing the following activities: 12/01/2019 09/29/2019  Hearing? N N  Vision? N N  Difficulty concentrating or making decisions? N N  Walking or climbing stairs? Y N  Comment Chronic back pain, cane in use as needed -  Dressing or bathing? N N  Doing errands, shopping? N -  Preparing Food and eating ? N -  Using the Toilet? N -  In the past six months, have you accidently leaked urine? N -  Do you have problems with loss of bowel control? Y -  Comment Followed by Gastroenterologist, [place] your Medications? Y -  Comment Wife assists -  Managing your Finances? Y -  Comment Wife assists -  Housekeeping or managing your Housekeeping? Y -  Comment Wife assists -  Some recent data might be hidden    Patient Care Team: Leone Haven, MD as PCP - General (Family Medicine) De Hollingshead, [facility] as Pharmacist (Pharmacist)  Indicate any recent Medical Services you may have received from other than [facility] providers in the past year (date may be approximate).     Assessment:   This is a routine wellness examination for Jeren.  I connected with [place] today by telephone and verified that I am speaking with the correct person using two  identifiers. Location patient: home Location provider: work Persons  participating in the virtual visit: patient, nurse.    I discussed the limitations, risks, security and privacy concerns of performing an evaluation and management service by telephone and the availability of in person appointments. The patient expressed understanding and verbally consented to this telephonic visit.    Interactive audio and video telecommunications were attempted between this provider and patient, however failed, due to patient having technical difficulties OR patient did not have access to video capability.  We continued and completed visit with audio only.  Some vital signs may be absent or patient reported.   Hearing/Vision screen  Hearing Screening   125Hz  250Hz  500Hz  1000Hz  2000Hz  3000Hz  4000Hz  6000Hz  8000Hz   Right ear:           Left ear:           Comments: Patient is able to hear conversational tones without difficulty.  No issues reported.  Vision Screening Comments: Followed [facility] Wears corrective lenses No retinopathy reported Visual acuity not assessed, virtual visit.  They have seen their ophthalmologist in the last 12 months.   Dietary issues and exercise activities discussed: Current Exercise Habits: Home exercise routine, Type of exercise: strength training/weights;calisthenics (gym exercises), Time (Minutes): 45, Frequency (Times/Week): 3, Weekly Exercise (Minutes/Week): 135, Intensity: Mild  High protein diet Fair water intake Caffeine- 4-6 bottles Diet Dr. Scarlette Calico. Encouraged to drink more water, less soda.  Goals      Patient Stated   .  "My blood sugars are too high" (pt-stated)      Current Barriers:  . Diabetes: uncontrolled, most recent A1c 7.8% o Reported to Dr. Caryl Bis that Jardiance copay was expensive . Current antihyperglycemic regimen: Jardiance 25 mg daily (just increased), Ozempic 0.5 mg (1 mg pen and reducing from 1 mg by 10 clicks) o APPROVED  for Ozempic assistance through 05/20/20 o Over income for [place] assistance . Cardiovascular risk reduction: o Antihypertensive regimen: lisinopril/HCTZ 20/25 mg daily, metoprolol succinate 25 mg daily o Antihyperlipemic regimen: rosuvastatin 40 mg daily, fenofibrate 145 mg daily, Vascepa 2 g BID;  - Received [employer] funding for 2021 for Vascepa cost o Antiplatelet regimen: ASA 81 mg  . [place] w/ Neurology Dr. 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 Chronic pain: S/p ankle surgery w/ [place]. Dr. Holley Raring pain management; oxycodone 5/325 mg TID, sometimes QID. Also takes gabapentin 1200 mg TID, sometimes takes an extra fourth dose.  . 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 No desire to quit smoking. Recent screening CT was negative for any abnormalities  Pharmacist Clinical Goal(s):  Marland Kitchen Over the next 90 days, patient will work with PharmD and primary care provider to address optimized glycemic management  Interventions: . Comprehensive medication review performed, medication list updated in electronic medical record . Inter-disciplinary care team collaboration (see longitudinal plan of care) . Reviewed current income. Patient is well over income for Jardiance assistance, as well as assistance for any other SGLT2. All other SGLT2 are also Tier 3 on insurance. Patient's copay is not high enough for [employer].  . Reviewed other medications. No other assistance programs available, based upon his income.  Marland Kitchen He will pick up Jardiance 25 mg tablet and increase. Will f/u as scheduled.   Patient Self Care Activities:  . Patient will check blood glucose BID ,  document, and provide at future appointments . Patient will take medications as prescribed . Patient will report any questions or  concerns to provider   Please see past updates related to this goal by clicking on the "Past Updates" button in the selected goal      .  Increase physical activity (pt-stated)      Upper and lower body strengthening exercises    .  Quit Smoking (pt-stated)      Depression Screen PHQ 2/9 Scores 12/01/2019 10/06/2019 08/24/2019 05/25/2019 02/27/2019 02/02/2019 11/28/2018  PHQ - 2 Score 0 1 0 0 0 0 0  PHQ- 9 Score - - - - - - -  Exception Documentation - - - - - - -    Fall Risk Fall Risk  10/06/2019 08/24/2019 05/25/2019 03/10/2019 02/27/2019  Falls in the past year? 1 1 1 1  0  Number falls in past yr: 0 0 0 1 0  Comment - - - right knee -  Injury with Fall? 0 0 0 - -  Comment - - - - -  Risk Factor Category  - - - - -  Risk for fall due to : Impaired balance/gait - - - -  Follow up - Falls evaluation completed Falls evaluation completed - Falls evaluation completed  Comment - - - - -   Handrails in use when climbing stairs? Yes  Home free of loose throw rugs in walkways, pet beds, electrical cords, etc? Yes  Adequate lighting in your home to reduce risk of falls? Yes   ASSISTIVE DEVICES UTILIZED TO PREVENT FALLS:  Life alert? Yes  Use of a cane, walker or w/c? Yes , cane as needed Grab bars in the bathroom? Yes  Shower chair or bench in shower? Yes  Elevated toilet seat or a handicapped toilet? Yes   TIMED UP AND GO:  Was the test performed? No . Virtual visit  Cognitive Function: MMSE - Mini Mental State Exam 11/15/2017 11/14/2016  Orientation to time 5 5  Orientation to Place 5 5  Registration 3 3  Attention/ Calculation 5 5  Recall 3 3  Language- name 2 objects 2 2  Language- repeat 1 1  Language- follow 3 step command 3 3  Language- read & follow direction 1 1  Write a sentence 1 1  Copy design 1 1  Total score 30 30     6CIT Screen 12/01/2019 11/28/2018  What Year? 0 points 0 points  What month? 0 points 0 points  What time? - 0 points  Count back from 20 - 0 points   Months in reverse 0 points 0 points  Repeat phrase 0 points 0 points  Total Score - 0   Immunizations Immunization History  Administered Date(s) Administered  . Hepatitis A, Adult 12/02/2018  . Influenza, High Dose Seasonal PF 01/29/2017, 03/19/2018, 01/10/2019  . Influenza,inj,Quad PF,6+ Mos 03/13/2016  . Influenza-Unspecified 01/21/2015  . PFIZER SARS-COV-2 Vaccination 06/01/2019, 06/22/2019  . Pneumococcal Conjugate-13 02/07/2015  . Pneumococcal Polysaccharide-23 01/18/2018  . Tdap 09/16/2015, 01/18/2018  . Zoster 09/16/2015  . Zoster Recombinat (Shingrix) 01/03/2018, 05/09/2018   Health Maintenance Health Maintenance  Topic Date Due  . Hepatitis C Screening  11/30/2020 (Originally 14-Jan-1941)  . INFLUENZA VACCINE  12/20/2019  . HEMOGLOBIN A1C  04/26/2020  . OPHTHALMOLOGY EXAM  05/24/2020  . FOOT EXAM  10/25/2020  . TETANUS/TDAP  01/19/2028  . COVID-19 Vaccine  Completed  . PNA vac Low Risk Adult  Completed   Hepatitis C Screening-  deferred per patient.   Dental Screening: Recommended annual dental exams for proper oral hygiene  Community Resource Referral / Chronic Care Management: CRR required this visit?  No   CCM required this visit?  No      Plan:   Keep all routine maintenance appointments.   CCM Telephone 12/21/19 @ 2:30  Follow up 02/01/20 @ 8:00   I have personally reviewed and noted the following in the patient's chart:   . Medical and social history . Use of alcohol, tobacco or illicit drugs  . Current medications and supplements . Functional ability and status . Nutritional status . Physical activity . Advanced directives . List of other physicians . Hospitalizations, surgeries, and ER visits in previous 12 months . Vitals . Screenings to include cognitive, depression, and falls . Referrals and appointments  In addition, I have reviewed and discussed with patient certain preventive protocols, quality metrics, and best practice  recommendations. A written personalized care plan for preventive services as well as general preventive health recommendations were provided to patient.     Varney Biles, LPN   [id number]

## 2019-12-18 ENCOUNTER — Telehealth: Payer: Self-pay | Admitting: *Deleted

## 2019-12-18 ENCOUNTER — Other Ambulatory Visit: Payer: Self-pay | Admitting: Family Medicine

## 2019-12-18 NOTE — Chronic Care Management (AMB) (Signed)
  Chronic Care Management   Note  12/18/2019 Name: James Hood MRN: [id number] DOB: [birth year]  James Hood is a 79 y.o. year old male who is a primary care patient of Leone Haven, MD and is actively engaged with the care management team. I reached out to James Hood by phone today to assist with re-scheduling a follow up visit with the Pharmacist.  Follow up plan: Telephone appointment with care management team member scheduled for:12/30/2019  [place], [facility]  [place], [place] [id number] Direct Dial: [email or web address] Website: [email or web address]

## 2019-12-18 NOTE — Progress Notes (Signed)
Error

## 2019-12-20 ENCOUNTER — Other Ambulatory Visit: Payer: Self-pay | Admitting: Family Medicine

## 2019-12-20 DIAGNOSIS — E782 Mixed hyperlipidemia: Secondary | ICD-10-CM

## 2019-12-21 ENCOUNTER — Telehealth: Payer: PPO

## 2019-12-28 ENCOUNTER — Other Ambulatory Visit: Payer: Self-pay | Admitting: Family Medicine

## 2019-12-30 ENCOUNTER — Telehealth: Payer: Self-pay | Admitting: Pharmacist

## 2019-12-30 ENCOUNTER — Ambulatory Visit (INDEPENDENT_AMBULATORY_CARE_PROVIDER_SITE_OTHER): Payer: PPO | Admitting: Pharmacist

## 2019-12-30 DIAGNOSIS — M545 Low back pain, unspecified: Secondary | ICD-10-CM

## 2019-12-30 DIAGNOSIS — G8929 Other chronic pain: Secondary | ICD-10-CM

## 2019-12-30 DIAGNOSIS — G250 Essential tremor: Secondary | ICD-10-CM

## 2019-12-30 DIAGNOSIS — E1142 Type 2 diabetes mellitus with diabetic polyneuropathy: Secondary | ICD-10-CM

## 2019-12-30 NOTE — Telephone Encounter (Signed)
  Chronic Care Management   Note  12/30/2019 Name: ROMEO ZIELINSKI MRN: [id number] DOB: 14-Dec-1940   Attempted to contact patient for scheduled appointment for medication management support. Left HIPAA compliant message for patient to return my call at their convenience.    Plan: - If I do not hear back from the patient by end of business today, will collaborate with Care Guide to outreach to schedule follow up with me  Catie Darnelle Maffucci, PharmD, [place], [place] Pharmacist [place] [phone]

## 2019-12-30 NOTE — Patient Instructions (Signed)
[name],   It was great talking with you today!  See the attached list of your appointments regarding the Nurse visit for James Hood to help teach you how to use the FreeStyle Libre glucose monitor. Please bring the prescriptions and your phone with you.   Call [facility] and schedule follow up about your chronic loose stools.   Talk to Dr. Holley Raring about a medication for chronic pain/depression and anxiety called duloxetine. We could consider replacing the bupropion with this medication, or, we could keep both and just think a bit harder about the doses. Dr. Holley Raring can see my note about this that I wrote today.   As always, call me with any questions or concerns in the meantime!  Catie Darnelle Maffucci, PharmD [phone]    Visit Information  Goals Addressed              This Visit's Progress     Patient Stated   .  "My blood sugars are too high" (pt-stated)        CARE PLAN ENTRY (see longtitudinal plan of care for additional care plan information)  Current Barriers:  . Diabetes: uncontrolled, most recent A1c 7.8% o Reports today that he has not figured out how to place [employer] CGM. Has not started using, but did pick up the prescription. Does report that he has a smart phone . Current antihyperglycemic regimen: Jardiance 25 mg daily, Ozempic 1 mg - 10 clicks less than 1 mg  o APPROVED for Ozempic assistance through 05/20/20 o Over income for [place] assistance . Cardiovascular risk reduction: o Antihypertensive regimen: lisinopril/HCTZ 20/25 mg daily, metoprolol succinate 25 mg daily; not checking home BP readings, but last clinic readings at goal o Antihyperlipemic regimen: rosuvastatin 40 mg daily, fenofibrate 145 mg daily, Vascepa 2 g BID; due for updated lipid panel s/p starting Vascepa - Received [employer] funding for 2021 for Vascepa cost o Antiplatelet regimen: ASA 81 mg  . [place] w/ Neurology Dr. Melrose Nakayama for multiple conditions: o Pseudo dementia:  followed by Dr. 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 Chronic pain: S/p ankle surgery w/ [place]. Dr. Holley Raring pain management; oxycodone 5/325 mg TID, sometimes QID. Also takes gabapentin 1200 mg TID, sometimes takes an extra fourth dose. Reports today that he wishes this oxycodone dose could be increased d/t continued pain.  o Does note that he has been going to the gym TIW for 45 minutes at a time, and doing some back stretches . Chronic diarrhea/colitis: follows w/ [facility], last appt 07/2019. Budesonide 9 mg daily, Lomotil 2 tabs up to QID PRN, patient reports he is not needing that often. Reports continued loose stools.  . 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 No desire to quit smoking. Recent screening CT was negative for any abnormalities  Pharmacist Clinical Goal(s):  Marland Kitchen Over the next 90 days, patient will work with PharmD and primary care provider to address optimized glycemic management  Interventions: . Comprehensive medication review performed, medication list updated in electronic medical record . Inter-disciplinary care team collaboration (see longitudinal plan of care) . Discussed importance of glucose monitoring. Scheduled for RN visit next week for CGM placement education.  . Discussed need to schedule f/u with GI, as he continues to have loose  stools. He confirms understanding . Discussed current pain. Could consider addition of duloxetine. However, bupropion increase duloxetine concentration. Additionally, primidone decreases bupropion concentration. Patient believes he was started on bupropion to help w/ tobacco cessation, but he is not interested at this time. Consider adding duloxetine, either at a low dose d/t concurrent bupropion use and titrating to effect, or d/c bupropion as  patient does not seem to be reporting much benefit. He will discuss this w/ Dr. Holley Raring.   Patient Self Care Activities:  . Patient will check blood glucose BID , document, and provide at future appointments . Patient will take medications as prescribed . Patient will report any questions or concerns to provider   Please see past updates related to this goal by clicking on the "Past Updates" button in the selected goal         The patient verbalized understanding of instructions provided today and agreed to receive a mailed copy of patient instruction and/or educational materials.  Plan:  - Scheduled f/u call in ~ 6 weeks  Catie Darnelle Maffucci, PharmD, [place], [place] Pharmacist [place] [phone]

## 2019-12-30 NOTE — Chronic Care Management (AMB) (Signed)
Chronic Care Management   Follow Up Note   12/30/2019 Name: James Hood MRN: [id number] DOB: 02/26/1941  Referred by: Leone Haven, MD Reason for referral : Chronic Care Management (Medication Management)   James Hood is a 79 y.o. year old male who is a primary care patient of Caryl Bis, Angela Adam, MD. The CCM team was consulted for assistance with chronic disease management and care coordination needs.    Contacted patient for medication management review.  Review of patient status, including review of consultants reports, relevant laboratory and other test results, and collaboration with appropriate care team members and the patient's provider was performed as part of comprehensive patient evaluation and provision of chronic care management services.    SDOH (Social Determinants of Health) assessments performed: Yes See Care Plan activities for detailed interventions related to SDOH)  SDOH Interventions     Most Recent Value  SDOH Interventions  Physical Activity Interventions Other (Comments)  [encouraged continued regular exercise]       Outpatient Encounter Medications as of 12/30/2019  Medication Sig  . aspirin EC 81 MG tablet Take 81 mg by mouth daily.  . budesonide (ENTOCORT EC) 3 MG 24 hr capsule Take 9 mg by mouth daily.  Marland Kitchen buPROPion (WELLBUTRIN SR) 150 MG 12 hr tablet TAKE ONE TABLET BY MOUTH TWICE A DAY  . Continuous Blood Gluc Receiver (FREESTYLE LIBRE 14 DAY READER) DEVI USE DAILY AS DIRECED TO CHECK BLOOD GLUCOSE  . Continuous Blood Gluc Sensor (FREESTYLE LIBRE 14 DAY SENSOR) MISC Used daily as directed to check glucose. ICD10 E11.9  . diphenoxylate-atropine (LOMOTIL) 2.5-0.025 MG tablet Take 1 tablet by mouth 2 (two) times daily.   . divalproex (DEPAKOTE ER) 500 MG 24 hr tablet Take 1 tablet by mouth 2 (two) times daily.  Marland Kitchen donepezil (ARICEPT) 10 MG tablet Take 10 mg by mouth at bedtime.  . empagliflozin (JARDIANCE) 25 MG TABS tablet Take 1 tablet  (25 mg total) by mouth daily.  . fenofibrate (TRICOR) 145 MG tablet TAKE ONE TABLET BY MOUTH DAILY  . finasteride (PROSCAR) 5 MG tablet TAKE ONE TABLET BY MOUTH DAILY  . gabapentin (NEURONTIN) 600 MG tablet TAKE TWO TABLETS BY MOUTH THREE TIMES A DAY  . icosapent Ethyl (VASCEPA) 1 g capsule Take 2 capsules (2 g total) by mouth 2 (two) times daily.  Marland Kitchen lisinopril-hydrochlorothiazide (ZESTORETIC) 20-25 MG tablet TAKE ONE TABLET BY MOUTH TWICE A DAY  . meloxicam (MOBIC) 7.5 MG tablet TAKE ONE TABLET BY MOUTH DAILY WITH FOOD AS NEEDED FOR PAIN  . memantine (NAMENDA) 10 MG tablet TAKE ONE TABLET BY MOUTH TWICE A DAY  . metoprolol succinate (TOPROL-XL) 25 MG 24 hr tablet Take 25 mg by mouth at bedtime.   . Multiple Vitamins-Minerals (CENTRUM SILVER PO) Take 1 tablet by mouth daily.  Marland Kitchen omeprazole (PRILOSEC) 20 MG capsule Take 20 mg by mouth 2 (two) times daily before a meal.   . oxyCODONE-acetaminophen (PERCOCET) 10-325 MG tablet Take 1 tablet by mouth every 6 (six) hours as needed for pain. Must last 30 days. Max #100/month  . primidone (MYSOLINE) 50 MG tablet Take 150-250 mg by mouth 2 (two) times daily. Take 250 mg by mouth in the morning & take 150 mg at night.  . QUEtiapine (SEROQUEL) 25 MG tablet Take 125 mg by mouth at bedtime. Can take an additional 25 mg if he wakes up overnight  . rosuvastatin (CRESTOR) 40 MG tablet TAKE ONE TABLET BY MOUTH DAILY  . Semaglutide, 1  MG/DOSE, (OZEMPIC, 1 MG/DOSE,) 4 MG/3ML SOPN Inject 1 mg into the skin once a week. 10 clicks less than 1 mg  . tamsulosin (FLOMAX) 0.4 MG CAPS capsule TAKE ONE CAPSULE BY MOUTH DAILY AFTER SUPPER  . [DISCONTINUED] Semaglutide,0.25 or 0.5MG /DOS, (OZEMPIC, 0.25 OR 0.5 MG/DOSE,) 2 MG/1.5ML SOPN Inject 0.375 mLs (0.5 mg total) into the skin once a week.  Derrill Memo ON 01/06/2020] oxyCODONE-acetaminophen (PERCOCET) 10-325 MG tablet Take 1 tablet by mouth every 6 (six) hours as needed for pain. Must last 30 days. Max #100/month   No  facility-administered encounter medications on file as of 12/30/2019.     Objective:   Goals Addressed              This Visit's Progress     Patient Stated   .  "My blood sugars are too high" (pt-stated)        CARE PLAN ENTRY (see longtitudinal plan of care for additional care plan information)  Current Barriers:  . Diabetes: uncontrolled, most recent A1c 7.8% o Reports today that he has not figured out how to place [employer] CGM. Has not started using, but did pick up the prescription. Does report that he has a smart phone . Current antihyperglycemic regimen: Jardiance 25 mg daily, Ozempic 1 mg - 10 clicks less than 1 mg  o APPROVED for Ozempic assistance through 05/20/20 o Over income for [place] assistance . Cardiovascular risk reduction: o Antihypertensive regimen: lisinopril/HCTZ 20/25 mg daily, metoprolol succinate 25 mg daily; not checking home BP readings, but last clinic readings at goal o Antihyperlipemic regimen: rosuvastatin 40 mg daily, fenofibrate 145 mg daily, Vascepa 2 g BID; due for updated lipid panel s/p starting Vascepa - Received [employer] funding for 2021 for Vascepa cost o Antiplatelet regimen: ASA 81 mg  . [place] w/ Neurology Dr. 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 Chronic pain: S/p ankle surgery w/ [place]. Dr. Holley Raring pain management; oxycodone 5/325 mg TID, sometimes QID. Also takes gabapentin 1200 mg TID, sometimes takes an extra fourth dose. Reports today that he wishes this oxycodone dose could be increased d/t continued pain.  o Does note that he has been going to the gym TIW for 45 minutes at a time, and doing some back stretches . Chronic diarrhea/colitis:  follows w/ [facility], last appt 07/2019. Budesonide 9 mg daily, Lomotil 2 tabs up to QID PRN, patient reports he is not needing that often. Reports continued loose stools.  . 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 No desire to quit smoking. Recent screening CT was negative for any abnormalities  Pharmacist Clinical Goal(s):  Marland Kitchen Over the next 90 days, patient will work with PharmD and primary care provider to address optimized glycemic management  Interventions: . Comprehensive medication review performed, medication list updated in electronic medical record . Inter-disciplinary care team collaboration (see longitudinal plan of care) . Discussed importance of glucose monitoring. Scheduled for RN visit next week for CGM placement education.  . Discussed need to schedule f/u with GI, as he continues to have loose stools. He confirms understanding . Discussed current pain. Could consider addition of duloxetine. However, bupropion increase duloxetine concentration. Additionally, primidone decreases bupropion concentration. Patient believes  he was started on bupropion to help w/ tobacco cessation, but he is not interested at this time. Consider adding duloxetine, either at a low dose d/t concurrent bupropion use and titrating to effect, or d/c bupropion as patient does not seem to be reporting much benefit. He will discuss this w/ Dr. Holley Raring.   Patient Self Care Activities:  . Patient will check blood glucose BID , document, and provide at future appointments . Patient will take medications as prescribed . Patient will report any questions or concerns to provider   Please see past updates related to this goal by clicking on the "Past Updates" button in the selected goal          Plan:  - Scheduled f/u call in ~ 6 weeks  Catie Darnelle Maffucci, PharmD, [place], [place] Pharmacist [employer] [phone]

## 2019-12-30 NOTE — Telephone Encounter (Signed)
Patient returned call. See CCM documentation 

## 2020-01-02 ENCOUNTER — Other Ambulatory Visit: Payer: Self-pay | Admitting: Family Medicine

## 2020-01-06 ENCOUNTER — Ambulatory Visit: Payer: PPO | Admitting: Pharmacist

## 2020-01-06 ENCOUNTER — Other Ambulatory Visit: Payer: Self-pay

## 2020-01-06 ENCOUNTER — Ambulatory Visit: Payer: PPO

## 2020-01-06 DIAGNOSIS — E1142 Type 2 diabetes mellitus with diabetic polyneuropathy: Secondary | ICD-10-CM

## 2020-01-06 NOTE — Chronic Care Management (AMB) (Signed)
Chronic Care Management   Follow Up Note   01/06/2020 Name: ESTIL VALLEE MRN: [id number] DOB: 1940-12-25  Referred by: Leone Haven, MD Reason for referral : Chronic Care Management (Medication Management)   James Hood is a 79 y.o. year old male who is a primary care patient of Caryl Bis, Angela Adam, MD. The CCM team was consulted for assistance with chronic disease management and care coordination needs.    Met with patient face to face for CGM teaching.   Review of patient status, including review of consultants reports, relevant laboratory and other test results, and collaboration with appropriate care team members and the patient's provider was performed as part of comprehensive patient evaluation and provision of chronic care management services.    SDOH (Social Determinants of Health) assessments performed: Yes See Care Plan activities for detailed interventions related to SDOH)  SDOH Interventions     Most Recent Value  SDOH Interventions  Financial Strain Interventions Other (Comment)  [medication assistance]       Outpatient Encounter Medications as of 01/06/2020  Medication Sig  . aspirin EC 81 MG tablet Take 81 mg by mouth daily.  . budesonide (ENTOCORT EC) 3 MG 24 hr capsule Take 9 mg by mouth daily.  Marland Kitchen buPROPion (WELLBUTRIN SR) 150 MG 12 hr tablet TAKE ONE TABLET BY MOUTH TWICE A DAY  . Continuous Blood Gluc Receiver (FREESTYLE LIBRE 14 DAY READER) DEVI USE DAILY AS DIRECED TO CHECK BLOOD GLUCOSE  . Continuous Blood Gluc Sensor (FREESTYLE LIBRE 14 DAY SENSOR) MISC Used daily as directed to check glucose. ICD10 E11.9  . diphenoxylate-atropine (LOMOTIL) 2.5-0.025 MG tablet Take 1 tablet by mouth 2 (two) times daily.   . divalproex (DEPAKOTE ER) 500 MG 24 hr tablet Take 1 tablet by mouth 2 (two) times daily.  Marland Kitchen donepezil (ARICEPT) 10 MG tablet Take 10 mg by mouth at bedtime.  . empagliflozin (JARDIANCE) 25 MG TABS tablet Take 1 tablet (25 mg total) by mouth  daily.  . fenofibrate (TRICOR) 145 MG tablet TAKE ONE TABLET BY MOUTH DAILY  . finasteride (PROSCAR) 5 MG tablet TAKE ONE TABLET BY MOUTH DAILY  . gabapentin (NEURONTIN) 600 MG tablet TAKE TWO TABLETS BY MOUTH THREE TIMES A DAY  . icosapent Ethyl (VASCEPA) 1 g capsule Take 2 capsules (2 g total) by mouth 2 (two) times daily.  Marland Kitchen lisinopril-hydrochlorothiazide (ZESTORETIC) 20-25 MG tablet TAKE ONE TABLET BY MOUTH TWICE A DAY  . meloxicam (MOBIC) 7.5 MG tablet TAKE ONE TABLET BY MOUTH DAILY WITH FOOD AS NEEDED FOR PAIN  . memantine (NAMENDA) 10 MG tablet TAKE ONE TABLET BY MOUTH TWICE A DAY  . metoprolol succinate (TOPROL-XL) 25 MG 24 hr tablet Take 25 mg by mouth at bedtime.   . Multiple Vitamins-Minerals (CENTRUM SILVER PO) Take 1 tablet by mouth daily.  Marland Kitchen omeprazole (PRILOSEC) 20 MG capsule Take 20 mg by mouth 2 (two) times daily before a meal.   . oxyCODONE-acetaminophen (PERCOCET) 10-325 MG tablet Take 1 tablet by mouth every 6 (six) hours as needed for pain. Must last 30 days. Max #100/month  . oxyCODONE-acetaminophen (PERCOCET) 10-325 MG tablet Take 1 tablet by mouth every 6 (six) hours as needed for pain. Must last 30 days. Max #100/month  . primidone (MYSOLINE) 50 MG tablet Take 150-250 mg by mouth 2 (two) times daily. Take 250 mg by mouth in the morning & take 150 mg at night.  . QUEtiapine (SEROQUEL) 25 MG tablet Take 125 mg by mouth at bedtime.  Can take an additional 25 mg if he wakes up overnight  . rosuvastatin (CRESTOR) 40 MG tablet TAKE ONE TABLET BY MOUTH DAILY  . Semaglutide, 1 MG/DOSE, (OZEMPIC, 1 MG/DOSE,) 4 MG/3ML SOPN Inject 1 mg into the skin once a week. 10 clicks less than 1 mg  . tamsulosin (FLOMAX) 0.4 MG CAPS capsule TAKE ONE CAPSULE BY MOUTH DAILY AFTER SUPPER   No facility-administered encounter medications on file as of 01/06/2020.     Objective:   Goals Addressed              This Visit's Progress     Patient Stated   .  "My blood sugars are too high"  (pt-stated)        CARE PLAN ENTRY (see longtitudinal plan of care for additional care plan information)  Current Barriers:  . 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 . Diabetes: uncontrolled, most recent A1c 7.8% . Current antihyperglycemic regimen: Jardiance 25 mg daily, Ozempic 1 mg - 10 clicks less than 1 mg  o APPROVED for Ozempic assistance through 05/20/20 o Over income for [place] assistance . Cardiovascular risk reduction: o Antihypertensive regimen: lisinopril/HCTZ 20/25 mg daily, metoprolol succinate 25 mg daily o Antihyperlipemic regimen: rosuvastatin 40 mg daily, fenofibrate 145 mg daily, Vascepa 2 g BID; - Received [employer] funding for 2021 for Vascepa cost o Antiplatelet regimen: ASA 81 mg  . [place] w/ Neurology Dr. 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 Chronic pain: S/p ankle surgery w/ [place]. Dr. Holley Raring pain management; oxycodone 5/325 mg TID, sometimes QID. Also takes gabapentin 1200 mg TID, sometimes takes an extra fourth dose. Reports today that he wishes this oxycodone dose could be increased d/t continued pain. Communicated w/ Dr. Holley Raring last week to consider duloxetine o Does note that he has been going to the gym TIW for 45 minutes at a time, and doing some back stretches . Chronic diarrhea/colitis: follows w/ [facility], last appt 07/2019. Budesonide 9 mg daily, Lomotil 2 tabs up to QID PRN, patient reports he is not needing that often. Reports continued loose stools.  . 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 No desire to quit smoking. Recent screening CT was negative for any abnormalities  Pharmacist Clinical Goal(s):  Marland Kitchen Over the next 90 days, patient  will work with PharmD and primary care provider to address optimized glycemic management  Interventions: . Comprehensive medication review performed, medication list updated in electronic medical record . Inter-disciplinary care team collaboration (see longitudinal plan of care) . Assisted patient in downloading [place] for [employer] 14 day, however, his Samsung device was not compatible. Walked through placement of device and use of reader. Patient verbalized understanding.  Patient Self Care Activities:  . Patient will check blood glucose BID , document, and provide at future appointments . Patient will take medications as prescribed . Patient will report any questions or concerns to provider   Please see past updates related to this goal by clicking on the "Past Updates" button in the selected goal          Plan:  - Will f/u with patient as previously scheduled  Catie Darnelle Maffucci, PharmD,  BCACP, CPP Clinical Pharmacist [place] [phone]

## 2020-01-06 NOTE — Patient Instructions (Signed)
Visit Information  Goals Addressed              This Visit's Progress     Patient Stated   .  "My blood sugars are too high" (pt-stated)        CARE PLAN ENTRY (see longtitudinal plan of care for additional care plan information)  Current Barriers:  . 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 . Diabetes: uncontrolled, most recent A1c 7.8% . Current antihyperglycemic regimen: Jardiance 25 mg daily, Ozempic 1 mg - 10 clicks less than 1 mg  o APPROVED for Ozempic assistance through 05/20/20 o Over income for [place] assistance . Cardiovascular risk reduction: o Antihypertensive regimen: lisinopril/HCTZ 20/25 mg daily, metoprolol succinate 25 mg daily o Antihyperlipemic regimen: rosuvastatin 40 mg daily, fenofibrate 145 mg daily, Vascepa 2 g BID; - Received [employer] funding for 2021 for Vascepa cost o Antiplatelet regimen: ASA 81 mg  . [place] w/ Neurology Dr. 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 Chronic pain: S/p ankle surgery w/ [place]. Dr. Holley Raring pain management; oxycodone 5/325 mg TID, sometimes QID. Also takes gabapentin 1200 mg TID, sometimes takes an extra fourth dose. Reports today that he wishes this oxycodone dose could be increased d/t continued pain. Communicated w/ Dr. Holley Raring last week to consider duloxetine o Does note that he has been going to the gym TIW for 45 minutes at a time, and doing some back stretches . Chronic diarrhea/colitis: follows w/ [facility], last appt 07/2019. Budesonide 9 mg daily, Lomotil 2 tabs up to QID PRN, patient reports he is not needing that often. Reports continued loose stools.  . 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 No desire to  quit smoking. Recent screening CT was negative for any abnormalities  Pharmacist Clinical Goal(s):  Marland Kitchen Over the next 90 days, patient will work with PharmD and primary care provider to address optimized glycemic management  Interventions: . Comprehensive medication review performed, medication list updated in electronic medical record . Inter-disciplinary care team collaboration (see longitudinal plan of care) . Assisted patient in downloading [place] for [employer] 14 day, however, his Samsung device was not compatible. Walked through placement of device and use of reader. Patient verbalized understanding.  Patient Self Care Activities:  . Patient will check blood glucose BID , document, and provide at future appointments . Patient will take medications as prescribed . Patient will report any questions or concerns to provider   Please see past updates related to this goal by clicking on the "Past Updates" button in the selected goal         The patient verbalized understanding of instructions provided today and declined a print copy of patient instruction materials.    Plan:  - Will f/u with patient as previously scheduled  Catie Darnelle Maffucci, PharmD, [place], [place] Pharmacist [place] [phone]

## 2020-01-16 ENCOUNTER — Other Ambulatory Visit: Payer: Self-pay | Admitting: Family Medicine

## 2020-01-22 ENCOUNTER — Ambulatory Visit: Payer: PPO | Admitting: Pharmacist

## 2020-01-22 DIAGNOSIS — E1142 Type 2 diabetes mellitus with diabetic polyneuropathy: Secondary | ICD-10-CM

## 2020-01-22 NOTE — Chronic Care Management (AMB) (Signed)
Chronic Care Management   Follow Up Note   01/22/2020 Name: James Hood MRN: [id number] DOB: 1941/03/29  Referred by: Leone Haven, MD Reason for referral : Chronic Care Management (Medication Management)   James Hood is a 79 y.o. year old male who is a primary care patient of Caryl Bis, Angela Adam, MD. The CCM team was consulted for assistance with chronic disease management and care coordination needs.    Received call from patient today with medication questions.  Review of patient status, including review of consultants reports, relevant laboratory and other test results, and collaboration with appropriate care team members and the patient's provider was performed as part of comprehensive patient evaluation and provision of chronic care management services.    SDOH (Social Determinants of Health) assessments performed: Yes See Care Plan activities for detailed interventions related to [facility])     Outpatient Encounter Medications as of 01/22/2020  Medication Sig  . aspirin EC 81 MG tablet Take 81 mg by mouth daily.  . budesonide (ENTOCORT EC) 3 MG 24 hr capsule Take 9 mg by mouth daily.  Marland Kitchen buPROPion (WELLBUTRIN SR) 150 MG 12 hr tablet TAKE ONE TABLET BY MOUTH TWICE A DAY  . Continuous Blood Gluc Receiver (FREESTYLE LIBRE 14 DAY READER) DEVI USE DAILY AS DIRECED TO CHECK BLOOD GLUCOSE  . Continuous Blood Gluc Sensor (FREESTYLE LIBRE 14 DAY SENSOR) MISC Used daily as directed to check glucose. ICD10 E11.9  . diphenoxylate-atropine (LOMOTIL) 2.5-0.025 MG tablet Take 1 tablet by mouth 2 (two) times daily.   . divalproex (DEPAKOTE ER) 500 MG 24 hr tablet Take 1 tablet by mouth 2 (two) times daily.  Marland Kitchen donepezil (ARICEPT) 10 MG tablet Take 10 mg by mouth at bedtime.  . empagliflozin (JARDIANCE) 25 MG TABS tablet Take 1 tablet (25 mg total) by mouth daily.  . fenofibrate (TRICOR) 145 MG tablet TAKE ONE TABLET BY MOUTH DAILY  . finasteride (PROSCAR) 5 MG tablet TAKE ONE TABLET  BY MOUTH DAILY  . gabapentin (NEURONTIN) 600 MG tablet TAKE TWO TABLETS BY MOUTH THREE TIMES A DAY  . lisinopril-hydrochlorothiazide (ZESTORETIC) 20-25 MG tablet TAKE ONE TABLET BY MOUTH TWICE A DAY  . meloxicam (MOBIC) 7.5 MG tablet TAKE ONE TABLET BY MOUTH DAILY WITH FOOD AS NEEDED FOR PAIN  . memantine (NAMENDA) 10 MG tablet TAKE ONE TABLET BY MOUTH TWICE A DAY  . metoprolol succinate (TOPROL-XL) 25 MG 24 hr tablet Take 25 mg by mouth at bedtime.   . Multiple Vitamins-Minerals (CENTRUM SILVER PO) Take 1 tablet by mouth daily.  Marland Kitchen omeprazole (PRILOSEC) 20 MG capsule Take 20 mg by mouth 2 (two) times daily before a meal.   . oxyCODONE-acetaminophen (PERCOCET) 10-325 MG tablet Take 1 tablet by mouth every 6 (six) hours as needed for pain. Must last 30 days. Max #100/month  . primidone (MYSOLINE) 50 MG tablet Take 150-250 mg by mouth 2 (two) times daily. Take 250 mg by mouth in the morning & take 150 mg at night.  . QUEtiapine (SEROQUEL) 25 MG tablet Take 125 mg by mouth at bedtime. Can take an additional 25 mg if he wakes up overnight  . rosuvastatin (CRESTOR) 40 MG tablet TAKE ONE TABLET BY MOUTH DAILY  . Semaglutide, 1 MG/DOSE, (OZEMPIC, 1 MG/DOSE,) 4 MG/3ML SOPN Inject 1 mg into the skin once a week. 10 clicks less than 1 mg  . tamsulosin (FLOMAX) 0.4 MG CAPS capsule TAKE ONE TABLET BY MOUTH DAILY AFTER SUPPER  . VASCEPA 1 g capsule  TAKE TWO CAPSULES BY MOUTH TWICE A DAY   No facility-administered encounter medications on file as of 01/22/2020.     Objective:   Goals Addressed              This Visit's Progress     Patient Stated   .  "My blood sugars are too high" (pt-stated)        CARE PLAN ENTRY (see longtitudinal plan of care for additional care plan information)  Current Barriers:  . Social, financial, and community barriers:  o Calls today wondering how to refill Januvia . Diabetes: uncontrolled, most recent A1c 7.8% . Current antihyperglycemic regimen: Jardiance 25 mg  daily, Ozempic 1 mg - 10 clicks less than 1 mg  o APPROVED for Ozempic assistance through 05/20/20 o Over income for [place] assistance . Cardiovascular risk reduction: o Antihypertensive regimen: lisinopril/HCTZ 20/25 mg daily, metoprolol succinate 25 mg daily o Antihyperlipemic regimen: rosuvastatin 40 mg daily, fenofibrate 145 mg daily, Vascepa 2 g BID; - Received [employer] funding for 2021 for Vascepa cost o Antiplatelet regimen: ASA 81 mg  . [place] w/ Neurology Dr. 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 Chronic pain: S/p ankle surgery w/ [place]. Dr. Holley Raring pain management; oxycodone 5/325 mg TID, sometimes QID. Also takes gabapentin 1200 mg TID, sometimes takes an extra fourth dose. Reports today that he wishes this oxycodone dose could be increased d/t continued pain. Communicated w/ Dr. Holley Raring last week to consider duloxetine o Gym TIW for 45 minutes at a time, and doing some back stretches . Chronic diarrhea/colitis: follows w/ [facility], Budesonide 9 mg daily, Lomotil 2 tabs up to QID PRN, patient reports he is not needing that often. Reports continued loose stools.  . 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 No desire to quit smoking.   Pharmacist Clinical Goal(s):  Marland Kitchen Over the next 90 days, patient will work with PharmD and primary care provider to address optimized glycemic management  Interventions: . Comprehensive medication review performed, medication list updated in electronic medical record . Inter-disciplinary care team collaboration (see longitudinal plan of care) . Reviewed that Januvia was d/c when Ozempic was started. Patient is unsure if he has actually been taking it, since his wife fills his pill box. He  will throw the bottle away.   Patient Self Care Activities:  . Patient will scan glucose at least QID and provide at future appointments . Patient will take medications as prescribed . Patient will report any questions or concerns to provider   Please see past updates related to this goal by clicking on the "Past Updates" button in the selected goal          Plan:  - Will f/u as previously scheduled  Catie Darnelle Maffucci, PharmD, [place], [place] Pharmacist [place] [phone]

## 2020-01-22 NOTE — Patient Instructions (Signed)
Visit Information  Goals Addressed              This Visit's Progress     Patient Stated   .  "My blood sugars are too high" (pt-stated)        CARE PLAN ENTRY (see longtitudinal plan of care for additional care plan information)  Current Barriers:  . Social, financial, and community barriers:  o Calls today wondering how to refill Januvia . Diabetes: uncontrolled, most recent A1c 7.8% . Current antihyperglycemic regimen: Jardiance 25 mg daily, Ozempic 1 mg - 10 clicks less than 1 mg  o APPROVED for Ozempic assistance through 05/20/20 o Over income for [place] assistance . Cardiovascular risk reduction: o Antihypertensive regimen: lisinopril/HCTZ 20/25 mg daily, metoprolol succinate 25 mg daily o Antihyperlipemic regimen: rosuvastatin 40 mg daily, fenofibrate 145 mg daily, Vascepa 2 g BID; - Received [employer] funding for 2021 for Vascepa cost o Antiplatelet regimen: ASA 81 mg  . [place] w/ Neurology Dr. 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 Chronic pain: S/p ankle surgery w/ [place]. Dr. Holley Raring pain management; oxycodone 5/325 mg TID, sometimes QID. Also takes gabapentin 1200 mg TID, sometimes takes an extra fourth dose. Reports today that he wishes this oxycodone dose could be increased d/t continued pain. Communicated w/ Dr. Holley Raring last week to consider duloxetine o Gym TIW for 45 minutes at a time, and doing some back stretches . Chronic diarrhea/colitis: follows w/ [facility], Budesonide 9 mg daily, Lomotil 2 tabs up to QID PRN, patient reports he is not needing that often. Reports continued loose stools.  . 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 No desire to quit smoking.   Pharmacist Clinical Goal(s):  Marland Kitchen Over  the next 90 days, patient will work with PharmD and primary care provider to address optimized glycemic management  Interventions: . Comprehensive medication review performed, medication list updated in electronic medical record . Inter-disciplinary care team collaboration (see longitudinal plan of care) . Reviewed that Januvia was d/c when Ozempic was started. Patient is unsure if he has actually been taking it, since his wife fills his pill box. He will throw the bottle away.   Patient Self Care Activities:  . Patient will scan glucose at least QID and provide at future appointments . Patient will take medications as prescribed . Patient will report any questions or concerns to provider   Please see past updates related to this goal by clicking on the "Past Updates" button in the selected goal         The patient verbalized understanding of instructions provided today and declined a print copy of patient instruction materials.    Plan:  - Will f/u as previously scheduled  Catie Darnelle Maffucci, PharmD, [place], [place] Pharmacist [place] [phone]

## 2020-01-23 ENCOUNTER — Other Ambulatory Visit: Payer: Self-pay | Admitting: Family Medicine

## 2020-01-27 ENCOUNTER — Other Ambulatory Visit: Payer: Self-pay | Admitting: Family Medicine

## 2020-01-27 DIAGNOSIS — H2513 Age-related nuclear cataract, bilateral: Secondary | ICD-10-CM | POA: Diagnosis not present

## 2020-01-27 DIAGNOSIS — E782 Mixed hyperlipidemia: Secondary | ICD-10-CM

## 2020-01-27 LAB — HM DIABETES EYE EXAM

## 2020-02-01 ENCOUNTER — Ambulatory Visit: Payer: PPO | Admitting: Family Medicine

## 2020-02-01 DIAGNOSIS — Z0289 Encounter for other administrative examinations: Secondary | ICD-10-CM

## 2020-02-02 ENCOUNTER — Other Ambulatory Visit: Payer: Self-pay | Admitting: Family Medicine

## 2020-02-04 ENCOUNTER — Encounter: Payer: Self-pay | Admitting: Student in an Organized Health Care Education/Training Program

## 2020-02-04 ENCOUNTER — Ambulatory Visit
Payer: PPO | Attending: Student in an Organized Health Care Education/Training Program | Admitting: Student in an Organized Health Care Education/Training Program

## 2020-02-04 ENCOUNTER — Other Ambulatory Visit: Payer: Self-pay

## 2020-02-04 VITALS — BP 109/64 | HR 68 | Temp 97.4°F | Resp 16 | Ht 71.0 in | Wt 180.0 lb

## 2020-02-04 DIAGNOSIS — M961 Postlaminectomy syndrome, not elsewhere classified: Secondary | ICD-10-CM | POA: Diagnosis not present

## 2020-02-04 DIAGNOSIS — M5417 Radiculopathy, lumbosacral region: Secondary | ICD-10-CM | POA: Diagnosis not present

## 2020-02-04 DIAGNOSIS — G894 Chronic pain syndrome: Secondary | ICD-10-CM | POA: Diagnosis not present

## 2020-02-04 DIAGNOSIS — Z720 Tobacco use: Secondary | ICD-10-CM

## 2020-02-04 DIAGNOSIS — M19072 Primary osteoarthritis, left ankle and foot: Secondary | ICD-10-CM

## 2020-02-04 DIAGNOSIS — T85192S Other mechanical complication of implanted electronic neurostimulator (electrode) of spinal cord, sequela: Secondary | ICD-10-CM | POA: Diagnosis not present

## 2020-02-04 DIAGNOSIS — M5386 Other specified dorsopathies, lumbar region: Secondary | ICD-10-CM | POA: Diagnosis not present

## 2020-02-04 MED ORDER — HYDROMORPHONE HCL 4 MG PO TABS
4.0000 mg | ORAL_TABLET | Freq: Three times a day (TID) | ORAL | 0 refills | Status: AC | PRN
Start: 2020-02-20 — End: 2020-03-21

## 2020-02-04 MED ORDER — HYDROMORPHONE HCL 4 MG PO TABS
4.0000 mg | ORAL_TABLET | Freq: Three times a day (TID) | ORAL | 0 refills | Status: AC | PRN
Start: 2020-03-21 — End: 2020-04-20

## 2020-02-04 NOTE — Progress Notes (Signed)
PROVIDER NOTE: Information contained herein reflects review and annotations entered in association with encounter. Interpretation of such information and data should be left to medically-trained personnel. Information provided to patient can be located elsewhere in the medical record under "Patient Instructions". Document created using STT-dictation technology, any transcriptional errors that may result from process are unintentional.    Patient: James Hood  Service Category: E/M  Provider: [name], MD  DOB: [birth year]  DOS: 02/04/2020  Specialty: Interventional Pain Management  MRN: [id number]  Setting: Ambulatory outpatient  PCP: Leone Haven, MD  Type: Established Patient    Referring Provider: Leone Haven, MD  Location: Office  Delivery: Face-to-face     HPI  Reason for encounter: James Hood, a 79 y.o. year old male, is here today for evaluation and management of his Failed back surgical syndrome [M96.1]. Mr. Takahashi's primary complain today is Leg Pain (bilat), Back Pain (lower), and Ankle Pain (left) Last encounter: Practice (10/06/2019). My last encounter with him was on 10/06/2019. Pertinent problems: Mr. Fann has Primary localized osteoarthritis of right knee; DJD (degenerative joint disease) of knee; Chronic lumbar pain; Lumbar herniated disc; Chronic pain syndrome; Failed back surgical syndrome; Lumbosacral radiculopathy; Facet arthritis of lumbar region; Neuropathy; Chronic pain of left ankle; and Impingement of left ankle joint on their pertinent problem list. Pain Assessment: Severity of Chronic pain is reported as a 6 /10. Location: Leg Right, Left/denies. Onset: More than a month ago. Quality: Aching. Timing: Constant. Modifying factor(s): meds. Vitals:  height is 5' 11"  (1.803 m) and weight is 180 lb (81.6 kg). His temporal temperature is 97.4 F (36.3 C) (abnormal). His blood pressure is 109/64 and his pulse is 68. His respiration is 16 and oxygen  saturation is 98%.   Patient follows up today for medication management.  He acknowledges that he has fallen approximately 3 times over the last month.  He states that he has tripped.  He is trying to be more careful as he walks around his house.  He did injure his right ankle and his right leg.  The pain is getting better.  He does have chronic left ankle pain due to left ankle arthrosis.  Persistent chronic lumbar radicular pain, failed back surgical syndrome, status post spinal cord stimulator trial/implant that was explanted due to IPG pocket site infection.  Patient states that oxycodone is becoming less effective in managing his pain.  We discussed an opioid rotation to equivalent MME of hydromorphone.  Risks and benefits were reviewed and patient would like to proceed with opioid rotation.  Pharmacotherapy Assessment   01/22/2020  1   10/06/2019  Oxycodone-Acetaminophen 10-325  100.00  30 Bi Lat   [id number]   Har ([id number])   0/0  50.00 MME  Medicare   [place]     Analgesic: Percocet 10 mg TID with extra #10 per month for breakthrough total quantity #100, MME= 50   Monitoring: [place] PMP: PDMP reviewed during this encounter.       Pharmacotherapy: No side-effects or adverse reactions reported. Compliance: No problems identified. Effectiveness: Clinically acceptable.  [name], RN  02/04/2020  8:13 AM  Sign when Signing Visit Nursing Pain Medication Assessment:  Safety precautions to be maintained throughout the outpatient stay will include: orient to surroundings, keep bed in low position, maintain call bell within reach at all times, provide assistance with transfer out of bed and ambulation.  Medication Inspection Compliance: Pill count conducted under aseptic conditions, in front of the patient.  Neither the pills nor the bottle was removed from the patient's sight at any time. Once count was completed pills were immediately returned to the patient in their original bottle.  Medication:  Oxycodone/APAP Pill/Patch Count: 62 of 100 pills remain Pill/Patch Appearance: Markings consistent with prescribed medication Bottle Appearance: Standard pharmacy container. Clearly labeled. Filled Date: 09 / 03 / 2021 Last Medication intake:  Today    UDS:  Summary  Date Value Ref Range Status  10/06/2019 Note  Final    Comment:    ==================================================================== [facility] (MW) ==================================================================== Test                             Result       Flag       Units Drug Present   Oxycodone                      900                     ng/mg creat   Oxymorphone                    174                     ng/mg creat   Noroxycodone                   1620                    ng/mg creat    Sources of oxycodone include scheduled prescription medications.    Oxymorphone and noroxycodone are expected metabolites of oxycodone.    Oxymorphone is also available as a scheduled prescription medication.   Phenobarbital                  PRESENT    Phenobarbital may be administered as a prescription drug; it is also    an expected metabolite of primidone and mephobarbital. ==================================================================== Test                      Result    Flag   Units      Ref Range   Creatinine              35               mg/dL      >=20 ==================================================================== Declared Medications:  Medication list was not provided. ==================================================================== For clinical consultation, please call [phone]. ====================================================================      ROS  Constitutional: Denies any fever or chills Gastrointestinal: No reported hemesis, hematochezia, vomiting, or acute GI distress Musculoskeletal: right ankle and leg pain (more acute) chronic left ankle pain Neurological: No  reported episodes of acute onset apraxia, aphasia, dysarthria, agnosia, amnesia, paralysis, loss of coordination, or loss of consciousness  Medication Review  FreeStyle Libre 14 Day Reader, FreeStyle Libre 14 Day Sensor, HYDROmorphone, Multiple Vitamins-Minerals, QUEtiapine, Semaglutide (1 MG/DOSE), aspirin EC, buPROPion, budesonide, diphenoxylate-atropine, divalproex, donepezil, empagliflozin, fenofibrate, finasteride, gabapentin, icosapent Ethyl, lisinopril-hydrochlorothiazide, meloxicam, memantine, metoprolol succinate, omeprazole, oxyCODONE-acetaminophen, primidone, rosuvastatin, and tamsulosin  History Review  Allergy: Mr. Mabie has No Known Allergies. Drug: Mr. Youngblood  reports no history of drug use. Alcohol:  reports previous alcohol use. Tobacco:  reports that he has been smoking cigarettes. He has a 30.00 pack-year smoking history. He has never used smokeless tobacco.  Social: Mr. Amis  reports that he has been smoking cigarettes. He has a 30.00 pack-year smoking history. He has never used smokeless tobacco. He reports previous alcohol use. He reports that he does not use drugs. 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 Surgical: Mr. Portner  has a past surgical history that includes esophageal stretch; Tonsillectomy; Total knee arthroplasty (Right, 11/15/2014); Lumbar laminectomy/decompression  microdiscectomy (Left, 02/03/2016); Colonoscopy with propofol (N/A, 09/14/2016); Joint replacement (Right, 2016); Pulse generator implant (N/A, 08/21/2017); Back surgery; Pulse generator implant (Right, 04/02/2018); and Ankle arthroscopy (Left, 09/29/2019). Family: family history includes Depression in his father; Heart attack in his mother; Heart disease in his mother; Hypertension in his father.  Laboratory Chemistry Profile   Renal Lab Results  Component Value Date   BUN 31 (H) 09/25/2019   CREATININE 1.26 (H) 09/25/2019   GFR 46.52 (L) 07/20/2019   GFRAA >60 09/25/2019   GFRNONAA 54 (L) 09/25/2019     Hepatic Lab Results  Component Value Date   AST 33 08/05/2019   ALT 27 08/05/2019   ALBUMIN 3.6 08/05/2019   ALKPHOS 48 08/05/2019   AMMONIA 14 08/09/2016     Electrolytes Lab Results  Component Value Date   NA 140 09/25/2019   K 4.1 09/25/2019   CL 100 09/25/2019   CALCIUM 9.2 09/25/2019     Bone Lab Results  Component Value Date   VD25OH 42.83 04/07/2019     Inflammation (CRP: Acute Phase) (ESR: Chronic Phase) Lab Results  Component Value Date   ESRSEDRATE 15 07/20/2019       Note: Above Lab results reviewed.  Recent Imaging Review  CT CHEST LUNG CANCER SCREENING LOW DOSE WO CONTRAST CLINICAL DATA:  79 year old male current smoker, with 30 pack-year history of smoking, for initial lung cancer screening  EXAM: CT CHEST WITHOUT CONTRAST LOW-DOSE FOR LUNG CANCER SCREENING  TECHNIQUE: Multidetector CT imaging of the chest was performed following the standard protocol without IV contrast.  COMPARISON:  CT chest dated 01/11/2017  FINDINGS: Cardiovascular: Heart is normal in size.  No pericardial effusion.  No evidence thoracic aortic aneurysm. Mild atherosclerotic calcifications of the aortic arch.  Coronary atherosclerosis of the LAD and left circumflex.  Mediastinum/Nodes: No suspicious mediastinal lymphadenopathy. Calcified subcarinal and right  azygoesophageal recess nodes, benign.  Bilateral thyroid nodules, measuring up to 18 mm on the right. This has been previously biopsied via ultrasound in 2019. No followup recommended, given prior biopsy. (Ref: J Am Coll Radiol. 2015 Feb;12(2): 143-50).  Lungs/Pleura: No focal consolidation.  No suspicious pulmonary nodules. 5 mm calcified granuloma in the right lower lobe, benign.  Faint ground-glass opacities in the right lower lobe, likely infectious/inflammatory or related to smoking-related lung disease.  No pleural effusion or pneumothorax.  Upper Abdomen: Visualized upper abdomen is grossly unremarkable, noting a moderate duodenal diverticulum (incompletely visualized).  Musculoskeletal: Degenerative changes of the visualized thoracolumbar spine.  IMPRESSION: Lung-RADS 1, negative. Continue annual screening with low-dose chest CT without contrast in 12 months.  Aortic Atherosclerosis (ICD10-I70.0).  Electronically Signed   By: Henderson Newcomer.D.  On: 08/04/2019 12:01 Note: Reviewed        Physical Exam  General appearance: Well nourished, well developed, and well hydrated. In no apparent acute distress Mental status: Alert, oriented x 3 (person, place, & time)       Respiratory: No evidence of acute respiratory distress Eyes: PERLA Vitals: BP 109/64   Pulse 68   Temp (!) 97.4 F (36.3 C) (Temporal)   Resp 16   Ht 5' 11"  (1.803 m)   Wt 180 lb (81.6 kg)   SpO2 98%   BMI 25.10 kg/m  BMI: Estimated body mass index is 25.1 kg/m as calculated from the following:   Height as of this encounter: 5' 11"  (1.803 m).   Weight as of this encounter: 180 lb (81.6 kg). Ideal: Ideal body weight: 75.3 kg (166 lb 0.1 oz) Adjusted ideal body weight: 77.8 kg (171 lb 9.7 oz)   Lumbar Exam  Skin & Axial Inspection: Well healed scar from previous spine surgery detected Alignment: Symmetrical Functional ROM: Restricted ROM       Stability: No instability  detected Muscle Tone/Strength: Functionally intact. No obvious neuro-muscular anomalies detected. Sensory (Neurological): Dermatomal pain pattern  Gait & Posture Assessment  Ambulation:  Limited Gait: Limited. Using assistive device to ambulate Posture: Difficulty standing up straight, due to pain   Lower Extremity Exam    Side: Right lower extremity  Side: Left lower extremity  Stability: No instability observed          Stability: No instability observed          Skin & Extremity Inspection: Skin color, temperature, and hair growth are WNL. No peripheral edema or cyanosis. No masses, redness, swelling, asymmetry, or associated skin lesions. No contractures.  Skin & Extremity Inspection: Evidence of prior arthroplastic surgery  Functional ROM:  Right leg pain, right ankle pain              Functional ROM: Pain restricted ROM                  Muscle Tone/Strength: Functionally intact. No obvious neuro-muscular anomalies detected.  Muscle Tone/Strength: Functionally intact. No obvious neuro-muscular anomalies detected.  Sensory (Neurological):  Musculoskeletal, acute from fall     Sensory (Neurological): Arthropathic arthralgia        DTR: Patellar: deferred today Achilles: deferred today Plantar: deferred today  DTR: Patellar: deferred today Achilles: deferred today Plantar: deferred today  Palpation: No palpable anomalies  Palpation: No palpable anomalies     Assessment   Status Diagnosis  Persistent Persistent Persistent 1. Failed back surgical syndrome   2. Lumbosacral radiculopathy   3. Primary osteoarthritis of left ankle   4. 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 Sciatica of left side associated with disorder of lumbar spine   6. Chronic pain syndrome   7. Tobacco abuse   8. Failure of spinal cord stimulator, sequela (explant due to IPG pocket infection)      Updated Problems: Problem  Impingement of Left  Ankle Joint  Chronic Pain of Left Ankle  Neuropathy  Lumbosacral Radiculopathy  Facet Arthritis of Lumbar Region  Chronic Pain Syndrome  Failed Back Surgical Syndrome  Lumbar Herniated Disc  Chronic Lumbar Pain  Djd (Degenerative Joint Disease) of Knee  Primary Localized Osteoarthritis of Right Knee    Plan of Care   [name] has a current medication list which includes the following long-term medication(s): bupropion, donepezil, fenofibrate, gabapentin, lisinopril-hydrochlorothiazide,  memantine, metoprolol succinate, omeprazole, primidone, quetiapine, rosuvastatin, and vascepa.  Discontinue oxycodone at next prescription filled.  Opioid rotation to hydromorphone, Dilaudid 4 mg 3 times daily as needed; MME equals 48.  We will see if this has an impact on his tolerance and central sensitization that he is experiencing from being on long term oxycodone therapy.  Follow-up in 2 months from fill date to assess response to opioid rotation.  Pharmacotherapy (Medications Ordered): Meds ordered this encounter  Medications  . HYDROmorphone (DILAUDID) 4 MG tablet    Sig: Take 1 tablet (4 mg total) by mouth every 8 (eight) hours as needed for severe pain. Must last 30 days.    Dispense:  90 tablet    Refill:  0    [place] STOP ACT - Not applicable. Fill one day early if pharmacy is closed on scheduled refill date.  [facility] HYDROmorphone (DILAUDID) 4 MG tablet    Sig: Take 1 tablet (4 mg total) by mouth every 8 (eight) hours as needed for severe pain. Must last 30 days.    Dispense:  90 tablet    Refill:  0    [place] STOP ACT - Not applicable. Fill one day early if pharmacy is closed on scheduled refill date.    Follow-up plan:   Return in about 10 weeks (around 04/14/2020) for Medication Management, in person.   Recent Visits No visits were found meeting these conditions. Showing recent visits within past 90 days and meeting all other requirements Today's Visits Date Type Provider Dept   02/04/20 Office Visit [name], MD [facility]  Showing today's visits and meeting all other requirements Future Appointments Date Type Provider Dept  04/19/20 Appointment [name], MD [facility]  Showing future appointments within next 90 days and meeting all other requirements  I discussed the assessment and treatment plan with the patient. The patient was provided an opportunity to ask questions and all were answered. The patient agreed with the plan and demonstrated an understanding of the instructions.  Patient advised to call back or seek an in-person evaluation if the symptoms or condition worsens.  Duration of encounter: 30 minutes.  Note by: [name], MD Date: 02/04/2020; Time: 8:40 AM

## 2020-02-04 NOTE — Progress Notes (Signed)
Nursing Pain Medication Assessment:  Safety precautions to be maintained throughout the outpatient stay will include: orient to surroundings, keep bed in low position, maintain call bell within reach at all times, provide assistance with transfer out of bed and ambulation.  Medication Inspection Compliance: Pill count conducted under aseptic conditions, in front of the patient. Neither the pills nor the bottle was removed from the patient's sight at any time. Once count was completed pills were immediately returned to the patient in their original bottle.  Medication: Oxycodone/APAP Pill/Patch Count: 62 of 100 pills remain Pill/Patch Appearance: Markings consistent with prescribed medication Bottle Appearance: Standard pharmacy container. Clearly labeled. Filled Date: 09 / 03 / 2021 Last Medication intake:  Today

## 2020-02-04 NOTE — Patient Instructions (Signed)
Hydromorphone tablets What is this medicine? HYDROMORPHONE (hye droe MOR fone) is a pain reliever. It is used to treat moderate to severe pain. This medicine may be used for other purposes; ask your health care provider or pharmacist if you have questions. COMMON BRAND NAME(S): Dilaudid What should I tell my health care provider before I take this medicine? They need to know if you have any of these conditions:  brain tumor  drug abuse or addiction  head injury  heart disease  if you often drink alcohol  kidney disease  liver disease  lung or breathing disease, like asthma  problems urinating  seizures  stomach or intestine problems  an unusual or allergic reaction to hydromorphone, other medicines, foods, dyes, or preservatives  pregnant or trying to get pregnant  breast-feeding How should I use this medicine? Take this medicine by mouth with a glass of water. Follow the directions on the prescription label. You can take this medicine with or without food. If it upsets your stomach, take it with food. Take your medicine at regular intervals. Do not take it more often than directed. Do not stop taking except on your doctor's advice. A special MedGuide will be given to you by the pharmacist with each prescription and refill. Be sure to read this information carefully each time. Talk to your pediatrician regarding the use of this medicine in children. Special care may be needed. Overdosage: If you think you have taken too much of this medicine contact a poison control center or emergency room at once. NOTE: This medicine is only for you. Do not share this medicine with others. What if I miss a dose? If you miss a dose, take it as soon as you can. If it is almost time for your next dose, take only that dose. Do not take double or extra doses. What may interact with this medicine? This medicine may interact with the following medications:  alcohol  antihistamines for  allergy, cough and cold  certain medicines for anxiety or sleep  certain medicines for depression like amitriptyline, fluoxetine, sertraline  certain medicines for seizures like phenobarbital, primidone  general anesthetics like halothane, isoflurane, methoxyflurane, propofol  local anesthetics like lidocaine, pramoxine, tetracaine  MAOIs like Carbex, Eldepryl, Marplan, Nardil, and Parnate  medicines that relax muscles for surgery  other narcotic medicines for pain or cough  phenothiazines like chlorpromazine, mesoridazine, prochlorperazine, thioridazine This list may not describe all possible interactions. Give your health care provider a list of all the medicines, herbs, non-prescription drugs, or dietary supplements you use. Also tell them if you smoke, drink alcohol, or use illegal drugs. Some items may interact with your medicine. What should I watch for while using this medicine? Tell your health care provider if your pain does not go away, if it gets worse, or if you have new or a different type of pain. You may develop tolerance to this drug. Tolerance means that you will need a higher dose of the drug for pain relief. Tolerance is normal and is expected if you take this drug for a long time. There are different types of narcotic drugs (opioids) for pain. If you take more than one type at the same time, you may have more side effects. Give your health care provider a list of all drugs you use. He or she will tell you how much drug to take. Do not take more drug than directed. Get emergency help right away if you have problems breathing. Do not suddenly  stop taking your drug because you may develop a severe reaction. Your body becomes used to the drug. This does NOT mean you are addicted. Addiction is a behavior related to getting and using a drug for a nonmedical reason. If you have pain, you have a medical reason to take pain drug. Your health care provider will tell you how much drug  to take. If your health care provider wants you to stop the drug, the dose will be slowly lowered over time to avoid any side effects. Talk to your health care provider about naloxone and how to get it. Naloxone is an emergency drug used for an opioid overdose. An overdose can happen if you take too much opioid. It can also happen if an opioid is taken with some other drugs or substances, like alcohol. Know the symptoms of an overdose, like trouble breathing, unusually tired or sleepy, or not being able to respond or wake up. Make sure to tell caregivers and close contacts where it is stored. Make sure they know how to use it. After naloxone is given, you must get emergency help right away. Naloxone is a temporary treatment. Repeat doses may be needed. You may get drowsy or dizzy. Do not drive, use machinery, or do anything that needs mental alertness until you know how this drug affects you. Do not stand up or sit up quickly, especially if you are an older patient. This reduces the risk of dizzy or fainting spells. Alcohol may interfere with the effect of this drug. Avoid alcoholic drinks. This drug will cause constipation. If you do not have a bowel movement for 3 days, call your health care provider. Your mouth may get dry. Chewing sugarless gum or sucking hard candy and drinking plenty of water may help. Contact your health care provider if the problem does not go away or is severe. What side effects may I notice from receiving this medicine? Side effects that you should report to your doctor or health care professional as soon as possible:  allergic reactions like skin rash, itching or hives, swelling of the face, lips, or tongue  breathing problems  confusion  seizures  signs and symptoms of low blood pressure like dizziness; feeling faint or lightheaded, falls; unusually weak or tired  trouble passing urine or change in the amount of urine Side effects that usually do not require medical  attention (report to your doctor or health care professional if they continue or are bothersome):  constipation  dry mouth  nausea, vomiting  tiredness This list may not describe all possible side effects. Call your doctor for medical advice about side effects. You may report side effects to FDA at 1-800-FDA-1088. Where should I keep my medicine? Keep out of the reach of children. This medicine can be abused. Keep your medicine in a safe place to protect it from theft. Do not share this medicine with anyone. Selling or giving away this medicine is dangerous and against the law. Store at room temperature between 15 and 30 degrees C (59 and 86 degrees F). Keep container tightly closed. Protect from light. This medicine may cause harm and death if it is taken by other adults, children, or pets. Return medicine that has not been used to an official disposal site. Contact the DEA at (217)514-5599 or your city/county government to find a site. If you cannot return the medicine, flush it down the toilet. Do not use the medicine after the expiration date. NOTE: This sheet is a summary.  It may not cover all possible information. If you have questions about this medicine, talk to your doctor, pharmacist, or health care provider.  2020 Elsevier/Gold Standard (2018-12-15 11:33:30)

## 2020-02-05 ENCOUNTER — Other Ambulatory Visit: Payer: Self-pay

## 2020-02-05 ENCOUNTER — Encounter: Payer: Self-pay | Admitting: Family Medicine

## 2020-02-05 ENCOUNTER — Ambulatory Visit (INDEPENDENT_AMBULATORY_CARE_PROVIDER_SITE_OTHER): Payer: PPO | Admitting: Family Medicine

## 2020-02-05 VITALS — BP 120/70 | HR 67 | Temp 97.4°F | Ht 71.0 in | Wt 172.0 lb

## 2020-02-05 DIAGNOSIS — E041 Nontoxic single thyroid nodule: Secondary | ICD-10-CM | POA: Diagnosis not present

## 2020-02-05 DIAGNOSIS — M79604 Pain in right leg: Secondary | ICD-10-CM | POA: Diagnosis not present

## 2020-02-05 DIAGNOSIS — E1142 Type 2 diabetes mellitus with diabetic polyneuropathy: Secondary | ICD-10-CM | POA: Diagnosis not present

## 2020-02-05 DIAGNOSIS — E785 Hyperlipidemia, unspecified: Secondary | ICD-10-CM

## 2020-02-05 DIAGNOSIS — Z23 Encounter for immunization: Secondary | ICD-10-CM

## 2020-02-05 DIAGNOSIS — W19XXXA Unspecified fall, initial encounter: Secondary | ICD-10-CM

## 2020-02-05 LAB — POCT GLYCOSYLATED HEMOGLOBIN (HGB A1C): Hemoglobin A1C: 6.4 % — AB (ref 4.0–5.6)

## 2020-02-05 NOTE — Assessment & Plan Note (Signed)
Thyroid ultrasound ordered.

## 2020-02-05 NOTE — Assessment & Plan Note (Signed)
Continue fenofibrate 145 mg once daily, Crestor 40 mg once daily, and Vascepa 2 g twice daily.

## 2020-02-05 NOTE — Patient Instructions (Signed)
Nice to see you. Somebody will contact you to schedule the thyroid ultrasound. Please let us know if your leg does not start to improve.  Preventing Falls and Fractures  Falls can be very serious, especially for older adults or people with osteoporosis  Falls can be caused by:  Tripping or slipping  Slow reflexes  Balance problems  Reduced muscle strength  Poor vision or a recent change in prescription  Illness and some medications (especially blood pressure pills, diuretics, heart medicines, muscle relaxants and sleep medications)  Drinking alcohol  To prevent falls outdoors:  Use a can or walker if needed  Wear rubber-soled shoes so you don't slip  DO NOT buy "shape up" shoes with rocker bottom soles if you have balance problems.  The thick soles and shape make it more difficult to keep your balance.  Put kitty litter or salt on icy sidewalks  Walk on the grass if the sidewalks are slick  Avoid walking on uneven ground whenever possible  To prevent falls indoors:  Keep rooms clutter-free, especially hallways, stairs and paths to light switches  Remove throw rugs  Install night lights, especially to and in the bathroom  Turn on lights before going downstairs  Keep a flashlight next to your bed  Buy a cordless phone to keep with you instead of jumping up to answer the phone  Install grab bars in the bathroom near the shower and toilet  Install rails on both sides of the stairs.  Make sure the stairs are well lit  Wear slippers with non-skid soles.  Do not walk around in stockings or socks  Balance problems and dizziness are not a normal part of growing older.  If you begin having balance problems or dizziness see your doctor.  Physical Therapy can help you with many balance problems, strengthening hip and leg muscles and with gait training.  To keep your bones healthy make sure you are getting enough calcium and Vitamin D each day.  Ask your doctor or  pharmacist about supplements.  Regular weight-bearing exercise like walking, lifting weights or dancing can help strengthen bones and prevent osteoporosis.

## 2020-02-05 NOTE — Assessment & Plan Note (Signed)
Suspect muscular bruising and injury based on exam.  Discussed that we could x-ray these areas though we both opted to defer that given lack of severe discomfort.  If he does not improve over the next week or 2 he will let us know.

## 2020-02-05 NOTE — Assessment & Plan Note (Signed)
Seems to be very well controlled.  He will check his glucose every 6 hours using his CGM.  He will remain on Jardiance 25 mg once daily and Ozempic 1 mg once weekly.  Check A1c.

## 2020-02-05 NOTE — Progress Notes (Signed)
Tommi Rumps, MD Phone: [phone]  James Hood is a 79 y.o. male who presents today for f/u.  DIABETES Disease Monitoring: Blood Sugar ranges-average glucose 119 on CGM, checking 2-5x/day Polyuria/phagia/dipsia- no      Optho- UTD Medications: Compliance- taking ozepic and jardiance Hypoglycemic symptoms- no  HYPERLIPIDEMIA Symptoms Chest pain on exertion:  no   Medications: Compliance- taking fenofibrate, crestor, vascepa Right upper quadrant pain- no  Muscle aches- no  Thyroid nodule: Due for follow-up ultrasound.  Falls: Patient notes 3 falls over the last month or so.  Most recently occurred about a week and a half ago.  He tripped on a cord coming out from his wife's recliner.  He notes he bruised his knee and has had pain in his quadriceps muscles as well as in his shin.  He has been able to ambulate okay after this.  He feels chronically off balance.  No dizziness or lightheadedness.  He has bought a balance board to help with this.  He has had physical therapy for balance in the past and did not enjoy it.     Social History   Tobacco Use  Smoking Status Current Every Day Smoker  . Packs/day: 0.50  . Years: 60.00  . Pack years: 30.00  . Types: Cigarettes  Smokeless Tobacco Never Used     ROS see history of present illness  Objective  Physical Exam Vitals:   02/05/20 1016  BP: 120/70  Pulse: 67  Temp: (!) 97.4 F (36.3 C)  SpO2: 98%    BP Readings from Last 3 Encounters:  02/05/20 120/70  02/04/20 109/64  10/26/19 118/70   Wt Readings from Last 3 Encounters:  02/05/20 172 lb (78 kg)  02/04/20 180 lb (81.6 kg)  12/01/19 180 lb (81.6 kg)    Physical Exam Constitutional:      General: He is not in acute distress.    Appearance: He is not diaphoretic.  Cardiovascular:     Rate and Rhythm: Normal rate and regular rhythm.     Heart sounds: Normal heart sounds.  Pulmonary:     Effort: Pulmonary effort is normal.     Breath sounds:  Normal breath sounds.  Musculoskeletal:     Right lower leg: No edema.     Left lower leg: No edema.     Comments: Mild discomfort on palpation of the muscles of his right quadriceps, no palpable defects, no bruising, no bruising over his lower leg, slight tenderness over his tibia in the midportion of his shin with no bony defects  Skin:    General: Skin is warm and dry.  Neurological:     Mental Status: He is alert.      Assessment/Plan: Please see individual problem list.  Thyroid nodule Thyroid ultrasound ordered.  Type 2 diabetes mellitus (HCC) Seems to be very well controlled.  He will check his glucose every 6 hours using his CGM.  He will remain on Jardiance 25 mg once daily and Ozempic 1 mg once weekly.  Check A1c.  Falls Discussed falls precautions.  Discussed removing area rugs as well as records that he may trip on.  Offered physical therapy referral though he declines and wants to work on balance exercises at home that he is done previously.  Right leg pain Suspect muscular bruising and injury based on exam.  Discussed that we could x-ray these areas though we both opted to defer that given lack of severe discomfort.  If he does not improve over  the next week or 2 he will let us know.  HLD (hyperlipidemia) Continue fenofibrate 145 mg once daily, Crestor 40 mg once daily, and Vascepa 2 g twice daily.    Orders Placed This Encounter  Procedures  . US THYROID    Standing Status:   Future    Standing Expiration Date:   02/04/2021    Order Specific Question:   Reason for Exam (SYMPTOM  OR DIAGNOSIS REQUIRED)    Answer:   follow-up thyroid nodule    Order Specific Question:   Preferred imaging location?    Answer:   [facility]  . POCT HgB A1C    No orders of the defined types were placed in this encounter.   [name] was seen today for follow-up.  Diagnoses and all orders for this visit:  Thyroid nodule -     US THYROID; Future  Type 2 diabetes mellitus  with diabetic polyneuropathy, without long-term current use of insulin (HCC) -     POCT HgB A1C  Fall, initial encounter  Right leg pain  Hyperlipidemia, unspecified hyperlipidemia type     This visit occurred during the SARS-CoV-2 public health emergency.  Safety protocols were in place, including screening questions prior to the visit, additional usage of staff PPE, and extensive cleaning of exam room while observing appropriate contact time as indicated for disinfecting solutions.    Tommi Rumps, MD [place]

## 2020-02-05 NOTE — Assessment & Plan Note (Signed)
Discussed falls precautions.  Discussed removing area rugs as well as records that he may trip on.  Offered physical therapy referral though he declines and wants to work on balance exercises at home that he is done previously.

## 2020-02-12 ENCOUNTER — Other Ambulatory Visit: Payer: Self-pay

## 2020-02-12 ENCOUNTER — Ambulatory Visit
Admission: RE | Admit: 2020-02-12 | Discharge: 2020-02-12 | Disposition: A | Discharge status: Home / Self Care | Payer: PPO | Source: Ambulatory Visit | Attending: Family Medicine | Admitting: Family Medicine

## 2020-02-12 DIAGNOSIS — E041 Nontoxic single thyroid nodule: Secondary | ICD-10-CM | POA: Diagnosis not present

## 2020-02-16 ENCOUNTER — Ambulatory Visit (INDEPENDENT_AMBULATORY_CARE_PROVIDER_SITE_OTHER): Payer: PPO | Admitting: Pharmacist

## 2020-02-16 DIAGNOSIS — E782 Mixed hyperlipidemia: Secondary | ICD-10-CM

## 2020-02-16 DIAGNOSIS — E1142 Type 2 diabetes mellitus with diabetic polyneuropathy: Secondary | ICD-10-CM | POA: Diagnosis not present

## 2020-02-16 DIAGNOSIS — M545 Low back pain, unspecified: Secondary | ICD-10-CM

## 2020-02-16 DIAGNOSIS — G8929 Other chronic pain: Secondary | ICD-10-CM

## 2020-02-16 NOTE — Chronic Care Management (AMB) (Signed)
Chronic Care Management   Follow Up Note   02/16/2020 Name: James Hood MRN: [id number] DOB: 1941/05/03  Referred by: Leone Haven, MD Reason for referral : Chronic Care Management (Medication Management)   James Hood is a 79 y.o. year old male who is a primary care patient of Caryl Bis, Angela Adam, MD. The CCM team was consulted for assistance with chronic disease management and care coordination needs.    Contacted patient for medication management review.  Review of patient status, including review of consultants reports, relevant laboratory and other test results, and collaboration with appropriate care team members and the patient's provider was performed as part of comprehensive patient evaluation and provision of chronic care management services.    SDOH (Social Determinants of Health) assessments performed: Yes See Care Plan activities for detailed interventions related to SDOH)  SDOH Interventions     Most Recent Value  SDOH Interventions  Financial Strain Interventions Other (Comment)  [manufacturer assistance program]       Outpatient Encounter Medications as of 02/16/2020  Medication Sig  . metoprolol succinate (TOPROL-XL) 25 MG 24 hr tablet Take 25 mg by mouth daily.  Marland Kitchen aspirin EC 81 MG tablet Take 81 mg by mouth daily.  . budesonide (ENTOCORT EC) 3 MG 24 hr capsule Take 9 mg by mouth daily.  Marland Kitchen buPROPion (WELLBUTRIN SR) 150 MG 12 hr tablet TAKE ONE TABLET BY MOUTH TWICE A DAY  . Continuous Blood Gluc Receiver (FREESTYLE LIBRE 14 DAY READER) DEVI USE DAILY AS DIRECED TO CHECK BLOOD GLUCOSE  . Continuous Blood Gluc Sensor (FREESTYLE LIBRE 14 DAY SENSOR) MISC Used daily as directed to check glucose. ICD10 E11.9  . diphenoxylate-atropine (LOMOTIL) 2.5-0.025 MG tablet Take 1 tablet by mouth 2 (two) times daily.   . divalproex (DEPAKOTE ER) 500 MG 24 hr tablet Take 1 tablet by mouth 2 (two) times daily.  Marland Kitchen donepezil (ARICEPT) 10 MG tablet Take 10 mg by mouth  at bedtime.  . empagliflozin (JARDIANCE) 25 MG TABS tablet Take 1 tablet (25 mg total) by mouth daily.  . fenofibrate (TRICOR) 145 MG tablet TAKE ONE TABLET BY MOUTH DAILY  . finasteride (PROSCAR) 5 MG tablet TAKE ONE TABLET BY MOUTH DAILY  . gabapentin (NEURONTIN) 600 MG tablet TAKE TWO TABLETS BY MOUTH THREE TIMES A DAY  . [START ON 02/20/2020] HYDROmorphone (DILAUDID) 4 MG tablet Take 1 tablet (4 mg total) by mouth every 8 (eight) hours as needed for severe pain. Must last 30 days.  Derrill Memo ON 03/21/2020] HYDROmorphone (DILAUDID) 4 MG tablet Take 1 tablet (4 mg total) by mouth every 8 (eight) hours as needed for severe pain. Must last 30 days.  Marland Kitchen lisinopril-hydrochlorothiazide (ZESTORETIC) 20-25 MG tablet TAKE ONE TABLET BY MOUTH TWICE A DAY  . meloxicam (MOBIC) 7.5 MG tablet TAKE ONE TABLET BY MOUTH DAILY WITH FOOD AS NEEDED FOR PAIN  . memantine (NAMENDA) 10 MG tablet TAKE ONE TABLET BY MOUTH TWICE A DAY  . Multiple Vitamins-Minerals (CENTRUM SILVER PO) Take 1 tablet by mouth daily.  Marland Kitchen omeprazole (PRILOSEC) 20 MG capsule Take 20 mg by mouth 2 (two) times daily before a meal.   . primidone (MYSOLINE) 50 MG tablet Take 150-250 mg by mouth 2 (two) times daily. Take 250 mg by mouth in the morning & take 150 mg at night.  . QUEtiapine (SEROQUEL) 25 MG tablet Take 125 mg by mouth at bedtime. Can take an additional 25 mg if he wakes up overnight  . rosuvastatin (CRESTOR)  40 MG tablet TAKE ONE TABLET BY MOUTH DAILY  . Semaglutide, 1 MG/DOSE, (OZEMPIC, 1 MG/DOSE,) 4 MG/3ML SOPN Inject 1 mg into the skin once a week. 10 clicks less than 1 mg  . tamsulosin (FLOMAX) 0.4 MG CAPS capsule TAKE ONE TABLET BY MOUTH DAILY AFTER SUPPER  . VASCEPA 1 g capsule TAKE TWO CAPSULES BY MOUTH TWICE A DAY  . [DISCONTINUED] metoprolol succinate (TOPROL-XL) 25 MG 24 hr tablet Take 25 mg by mouth at bedtime.    No facility-administered encounter medications on file as of 02/16/2020.     Objective:     Goals Addressed               This Visit's Progress     Patient Stated   .  "My blood sugars are too high" (pt-stated)        CARE PLAN ENTRY (see longtitudinal plan of care for additional care plan information)  Current Barriers:  . Social, financial, and community barriers:  o Notes that he continues to struggle with pain, but has coping mechanisms.  . Diabetes: controlled, most recent A1c 6.4% . Current antihyperglycemic regimen: Jardiance 25 mg daily, Ozempic 1 mg - 10 clicks less than 1 mg  o APPROVED for Ozempic assistance through 05/20/20 o Over income for [place] assistance . Current glucose readings:  Date of Download: 9/4-9/17/21 % Time CGM is active: 67% Average Glucose: 119 mg/dL Glucose Management Indicator: 6.2% Glucose Variability: 20.4% (goal <36%) Time in Goal:  - Time in range 70-180: 96% - Time above range: 2% - Time below range: 2% . Cardiovascular risk reduction: o Antihypertensive regimen: lisinopril/HCTZ 20/25 mg daily, metoprolol succinate 25 mg daily; BP well controlled at last office visit o Antihyperlipemic regimen: rosuvastatin 40 mg daily, fenofibrate 145 mg daily, Vascepa 2 g BID; last LDL at goal, full lipid panel has not been checked since addition of Vascepa - Received [employer] funding for 2021 for Vascepa cost o Antiplatelet regimen: ASA 81 mg  . [place] w/ Neurology Dr. 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 Chronic pain: S/p ankle surgery w/ [place]. Dr. Holley Raring pain management; hydromorphone 4 mg Q8H now instead of oxycodone. Also takes gabapentin 1200 mg TID, sometimes takes an extra fourth dose.  o Continues to go to gym [employer] for 45 minutes at a time; incorporates some back  stretches . Chronic diarrhea/colitis: [facility], Budesonide 9 mg daily, Lomotil 2 tabs up to QID PRN; reports he feels this is well managed at this time . 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 Not interested in discussing cessation at this time.  Pharmacist Clinical Goal(s):  Marland Kitchen Over the next 90 days, patient will work with PharmD and primary care provider to address optimized glycemic management  Interventions: . Comprehensive medication review performed, medication list updated in electronic medical record . Inter-disciplinary care team collaboration (see longitudinal plan of care) . Continue current regimen. Praised patient for attainment of goal glucose control.  . Reviewed plan for 2022 enrollment for [employer]. Will discuss and begin process at next visit . Reviewed concepts of opioid tolerability. He hopes he has a better response to hydromorphone than he did to oxycodone.  Patient Self Care Activities:  .  Patient will scan glucose at least QID and provide at future appointments . Patient will take medications as prescribed . Patient will report any questions or concerns to provider   Please see past updates related to this goal by clicking on the "Past Updates" button in the selected goal          Plan:  - Scheduled f/u call in ~ 6 weeks  Catie Darnelle Maffucci, PharmD, [place], [place] Pharmacist [place] [phone]

## 2020-02-16 NOTE — Patient Instructions (Signed)
Mr. Lacock,   It was great talking with you today! I am SO PROUD of that A1c! Keep up the great work. We will discuss our plan to re-enroll for [employer] assistance in 2022. See enclosed for our next phone appointment.   Take care,   Catie Darnelle Maffucci, PharmD [phone]   Visit Information  Goals Addressed              This Visit's Progress     Patient Stated   .  "My blood sugars are too high" (pt-stated)        CARE PLAN ENTRY (see longtitudinal plan of care for additional care plan information)  Current Barriers:  . Social, financial, and community barriers:  o Notes that he continues to struggle with pain, but has coping mechanisms.  . Diabetes: controlled, most recent A1c 6.4% . Current antihyperglycemic regimen: Jardiance 25 mg daily, Ozempic 1 mg - 10 clicks less than 1 mg  o APPROVED for Ozempic assistance through 05/20/20 o Over income for [place] assistance . Current glucose readings:  Date of Download: 9/4-9/17/21 % Time CGM is active: 67% Average Glucose: 119 mg/dL Glucose Management Indicator: 6.2% Glucose Variability: 20.4% (goal <36%) Time in Goal:  - Time in range 70-180: 96% - Time above range: 2% - Time below range: 2% . Cardiovascular risk reduction: o Antihypertensive regimen: lisinopril/HCTZ 20/25 mg daily, metoprolol succinate 25 mg daily; BP well controlled at last office visit o Antihyperlipemic regimen: rosuvastatin 40 mg daily, fenofibrate 145 mg daily, Vascepa 2 g BID; last LDL at goal, full lipid panel has not been checked since addition of Vascepa - Received [employer] funding for 2021 for Vascepa cost o Antiplatelet regimen: ASA 81 mg  . [place] w/ Neurology Dr. 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 Chronic pain: S/p ankle surgery w/ [place]. Dr. Holley Raring pain management; hydromorphone 4 mg Q8H now instead of oxycodone. Also takes gabapentin 1200 mg TID, sometimes takes an extra fourth dose.  o Continues to go to gym TIW for 45 minutes at a time; incorporates some back stretches . Chronic diarrhea/colitis: [facility], Budesonide 9 mg daily, Lomotil 2 tabs up to QID PRN; reports he feels this is well managed at this time . 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 Not interested in discussing cessation at this time.  Pharmacist Clinical Goal(s):  Marland Kitchen Over the next 90 days, patient will work with PharmD and primary care provider to address optimized glycemic management  Interventions: . Comprehensive medication review performed, medication list updated in electronic medical record . Inter-disciplinary care team collaboration (see longitudinal plan of care) . Continue current regimen. Praised patient for attainment of goal glucose control.  . Reviewed plan for 2022 enrollment for [employer]. Will discuss and begin process at next visit . Reviewed concepts of opioid tolerability. He hopes he has a better response to hydromorphone than he did to oxycodone.  Patient Self Care Activities:  . Patient will scan glucose at least QID and provide at future appointments . Patient will take medications as prescribed . Patient will report any questions or concerns to provider   Please see past updates related to this goal by clicking on the "Past Updates"  button in the selected goal         The patient verbalized understanding of instructions provided today and agreed to receive a mailed copy of patient instruction and/or educational materials.  Plan:  - Scheduled f/u call in ~ 6 weeks  Catie Darnelle Maffucci, PharmD, [place], [place] Pharmacist [place] [phone]

## 2020-02-29 ENCOUNTER — Telehealth: Payer: Self-pay | Admitting: Student in an Organized Health Care Education/Training Program

## 2020-02-29 NOTE — Telephone Encounter (Signed)
Please call patient and tell him this. Thanks

## 2020-02-29 NOTE — Telephone Encounter (Signed)
Patient states he is having a lot of dizzyness and not dealing well with the new meds. He would like to go back on his old medication. Please advise. Patient was just here 02-04-20

## 2020-02-29 NOTE — Telephone Encounter (Signed)
Please advise 

## 2020-03-01 ENCOUNTER — Encounter: Payer: Self-pay | Admitting: Student in an Organized Health Care Education/Training Program

## 2020-03-01 ENCOUNTER — Other Ambulatory Visit: Payer: Self-pay

## 2020-03-01 ENCOUNTER — Ambulatory Visit
Payer: PPO | Attending: Student in an Organized Health Care Education/Training Program | Admitting: Student in an Organized Health Care Education/Training Program

## 2020-03-01 VITALS — BP 75/60 | HR 87 | Temp 98.2°F | Resp 14 | Ht 71.0 in | Wt 180.0 lb

## 2020-03-01 DIAGNOSIS — M5442 Lumbago with sciatica, left side: Secondary | ICD-10-CM

## 2020-03-01 DIAGNOSIS — F1190 Opioid use, unspecified, uncomplicated: Secondary | ICD-10-CM

## 2020-03-01 DIAGNOSIS — M5386 Other specified dorsopathies, lumbar region: Secondary | ICD-10-CM | POA: Diagnosis not present

## 2020-03-01 DIAGNOSIS — G894 Chronic pain syndrome: Secondary | ICD-10-CM | POA: Diagnosis not present

## 2020-03-01 DIAGNOSIS — G8929 Other chronic pain: Secondary | ICD-10-CM | POA: Diagnosis not present

## 2020-03-01 DIAGNOSIS — M19072 Primary osteoarthritis, left ankle and foot: Secondary | ICD-10-CM | POA: Diagnosis not present

## 2020-03-01 DIAGNOSIS — M47816 Spondylosis without myelopathy or radiculopathy, lumbar region: Secondary | ICD-10-CM

## 2020-03-01 DIAGNOSIS — M5417 Radiculopathy, lumbosacral region: Secondary | ICD-10-CM

## 2020-03-01 DIAGNOSIS — T85192S Other mechanical complication of implanted electronic neurostimulator (electrode) of spinal cord, sequela: Secondary | ICD-10-CM

## 2020-03-01 MED ORDER — OXYCODONE-ACETAMINOPHEN 10-325 MG PO TABS
1.0000 | ORAL_TABLET | Freq: Four times a day (QID) | ORAL | 0 refills | Status: DC | PRN
Start: 2020-03-01 — End: 2020-05-26

## 2020-03-01 MED ORDER — OXYCODONE-ACETAMINOPHEN 10-325 MG PO TABS
1.0000 | ORAL_TABLET | Freq: Four times a day (QID) | ORAL | 0 refills | Status: DC | PRN
Start: 2020-04-30 — End: 2020-05-26

## 2020-03-01 MED ORDER — OXYCODONE-ACETAMINOPHEN 10-325 MG PO TABS
1.0000 | ORAL_TABLET | Freq: Four times a day (QID) | ORAL | 0 refills | Status: DC | PRN
Start: 2020-03-31 — End: 2020-05-26

## 2020-03-01 NOTE — Progress Notes (Signed)
Notified [facility] to cancel remaining prescriptions for Hydromorphone. Spoke with Essie.

## 2020-03-01 NOTE — Progress Notes (Signed)
Nursing Pain Medication Assessment:  Safety precautions to be maintained throughout the outpatient stay will include: orient to surroundings, keep bed in low position, maintain call bell within reach at all times, provide assistance with transfer out of bed and ambulation.  Medication Inspection Compliance: Pill count conducted under aseptic conditions, in front of the patient. Neither the pills nor the bottle was removed from the patient's sight at any time. Once count was completed pills were immediately returned to the patient in their original bottle.  Medication: Hydromorphone (Dilaudid) Pill/Patch Count: 76 of 90 pills remain Pill/Patch Appearance: Markings consistent with prescribed medication Bottle Appearance: Standard pharmacy container. Clearly labeled. Filled Date: 02/20/2020 Last Medication intake:  Today   Hydromorphone causing dizziness/light-headedness. 0920 Wasted #76 Hydromorphone into the toilet, witnessed by patient and Rennis Harding, RN.

## 2020-03-01 NOTE — Patient Instructions (Signed)
1. Dispose and document wasted Hydromorphone 2. Revert back to Oxycodone, previous dose.

## 2020-03-01 NOTE — Progress Notes (Signed)
PROVIDER NOTE: Information contained herein reflects review and annotations entered in association with encounter. Interpretation of such information and data should be left to medically-trained personnel. Information provided to patient can be located elsewhere in the medical record under "Patient Instructions". Document created using STT-dictation technology, any transcriptional errors that may result from process are unintentional.    Patient: James Hood  Service Category: E/M  Provider: [name], MD  DOB: [birth year]  DOS: 03/01/2020  Specialty: Interventional Pain Management  MRN: [id number]  Setting: Ambulatory outpatient  PCP: Leone Haven, MD  Type: Established Patient    Referring Provider: Leone Haven, MD  Location: Office  Delivery: Face-to-face     HPI  [name], a 79 y.o. year old male, is here today because of his Lumbosacral radiculopathy [M54.17]. James Hood's primary complain today is Ankle Pain (left) Last encounter: My last encounter with him was on 02/29/2020. Pertinent problems: James Hood has Primary localized osteoarthritis of right knee; DJD (degenerative joint disease) of knee; Chronic lumbar pain; Lumbar herniated disc; Chronic pain syndrome; Failed back surgical syndrome; Lumbosacral radiculopathy; Facet arthritis of lumbar region; Neuropathy; Chronic pain of left ankle; and Impingement of left ankle joint on their pertinent problem list. Pain Assessment: Severity of Chronic pain is reported as a 3 /10. Location: Ankle Left/up the left leg. Onset: More than a month ago. Quality: Aching. Timing: Constant. Modifying factor(s): medications. Vitals:  height is 5' 11"  (1.803 m) and weight is 180 lb (81.6 kg). His temporal temperature is 98.2 F (36.8 C). His blood pressure is 75/60 (abnormal) and his pulse is 87. His respiration is 14 and oxygen saturation is 98%.   Reason for encounter: medication management.   Patient presents today for  medication management.  At his last visit, given tolerance to oxycodone, we did an opioid rotation to hydromorphone.  This has resulted in side effects of dizziness and fall.  Patient injured his elbow related to a fall.  While hydromorphone does provide analgesic benefit, side effects associated with current use do not justify the benefits so we will transition back to his previous regimen of Percocet 10 mg 4 times daily as needed, quantity 100/month.  Patient understands that he can take it up to 3 times a day and have an extra 10/month for breakthrough pain.  Pharmacotherapy Assessment   Analgesic: Percocet 10 mg TID with extra #10 per month for breakthrough total quantity #100, MME= 50   Monitoring: [place] PMP: PDMP reviewed during this encounter.       Pharmacotherapy: No side-effects or adverse reactions reported. Compliance: No problems identified. Effectiveness: Clinically acceptable.  James Martins, RN  03/01/2020  9:23 AM  Sign when Signing Visit Nursing Pain Medication Assessment:  Safety precautions to be maintained throughout the outpatient stay will include: orient to surroundings, keep bed in low position, maintain call bell within reach at all times, provide assistance with transfer out of bed and ambulation.  Medication Inspection Compliance: Pill count conducted under aseptic conditions, in front of the patient. Neither the pills nor the bottle was removed from the patient's sight at any time. Once count was completed pills were immediately returned to the patient in their original bottle.  Medication: Hydromorphone (Dilaudid) Pill/Patch Count: 76 of 90 pills remain Pill/Patch Appearance: Markings consistent with prescribed medication Bottle Appearance: Standard pharmacy container. Clearly labeled. Filled Date: 02/20/2020 Last Medication intake:  Today   Hydromorphone causing dizziness/light-headedness. 0920 Wasted #76 Hydromorphone into the toilet, witnessed by patient  and  Rennis Harding, RN.    UDS:  Summary  Date Value Ref Range Status  10/06/2019 Note  Final    Comment:    ==================================================================== [employer] [facility] 13 (MW) ==================================================================== Test                             Result       Flag       Units Drug Present   Oxycodone                      900                     ng/mg creat   Oxymorphone                    174                     ng/mg creat   Noroxycodone                   1620                    ng/mg creat    Sources of oxycodone include scheduled prescription medications.    Oxymorphone and noroxycodone are expected metabolites of oxycodone.    Oxymorphone is also available as a scheduled prescription medication.   Phenobarbital                  PRESENT    Phenobarbital may be administered as a prescription drug; it is also    an expected metabolite of primidone and mephobarbital. ==================================================================== Test                      Result    Flag   Units      Ref Range   Creatinine              35               mg/dL      >=20 ==================================================================== Declared Medications:  Medication list was not provided. ==================================================================== For clinical consultation, please call [phone]. ====================================================================      ROS  Constitutional: Denies any fever or chills Gastrointestinal: No reported hemesis, hematochezia, vomiting, or acute GI distress Musculoskeletal: Low back pain, bilateral leg pain, left ankle pain Neurological: No reported episodes of acute onset apraxia, aphasia, dysarthria, agnosia, amnesia, paralysis, loss of coordination, or loss of consciousness  Medication Review  FreeStyle Libre 14 Day Reader, FreeStyle Libre 14 Day Sensor, HYDROmorphone, Multiple  Vitamins-Minerals, QUEtiapine, Semaglutide (1 MG/DOSE), aspirin EC, buPROPion, budesonide, diphenoxylate-atropine, divalproex, donepezil, empagliflozin, fenofibrate, finasteride, gabapentin, icosapent Ethyl, lisinopril-hydrochlorothiazide, meloxicam, memantine, metoprolol succinate, omeprazole, oxyCODONE-acetaminophen, primidone, rosuvastatin, and tamsulosin  History Review  Allergy: Mr. Garlow has No Known Allergies. Drug: Mr. Mazurowski  reports no history of drug use. Alcohol:  reports previous alcohol use. Tobacco:  reports that he has been smoking cigarettes. He has a 30.00 pack-year smoking history. He has never used smokeless tobacco. Social: Mr. Gencarelli  reports that he has been smoking cigarettes. He has a 30.00 pack-year smoking history. He has never used smokeless tobacco. He reports previous alcohol use. He reports that he does not use drugs. 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 Surgical: Mr. Bertoni  has a past surgical history that includes esophageal stretch; Tonsillectomy; Total knee arthroplasty (Right, 11/15/2014); Lumbar laminectomy/decompression microdiscectomy (Left, 02/03/2016); Colonoscopy with propofol (N/A, 09/14/2016); Joint replacement (Right, 2016); Pulse generator implant (N/A, 08/21/2017); Back surgery; Pulse generator implant (Right, 04/02/2018); and Ankle arthroscopy (Left,  09/29/2019). Family: family history includes Depression in his father; Heart attack in his mother; Heart disease in his mother; Hypertension in his father.  Laboratory Chemistry Profile   Renal Lab Results  Component Value Date   BUN 31 (H) 09/25/2019   CREATININE 1.26 (H) 09/25/2019   GFR 46.52 (L) 07/20/2019   GFRAA >60 09/25/2019   GFRNONAA 54 (L) 09/25/2019     Hepatic Lab Results  Component Value Date   AST 33 08/05/2019   ALT 27 08/05/2019   ALBUMIN 3.6 08/05/2019   ALKPHOS 48 08/05/2019   AMMONIA 14 08/09/2016     Electrolytes Lab Results  Component Value Date   NA 140 09/25/2019   K 4.1 09/25/2019   CL 100 09/25/2019   CALCIUM 9.2 09/25/2019     Bone Lab Results  Component Value Date   VD25OH 42.83 04/07/2019     Inflammation (CRP: Acute Phase) (ESR: Chronic Phase) Lab Results  Component Value Date   ESRSEDRATE 15 07/20/2019       Note: Above Lab results reviewed.  Recent Imaging Review  US THYROID CLINICAL DATA:  Thyroid nodule follow-up  EXAM: THYROID ULTRASOUND  TECHNIQUE: Ultrasound examination of the thyroid gland and adjacent soft tissues was performed.  COMPARISON:  02/23/2019  FINDINGS: Parenchymal Echotexture: Moderately heterogenous  Isthmus: 0.5 cm  Right lobe: 4.1 x 2 x 2.3 cm  Left lobe: 3.4 x 1.8 x 1.7 cm  _________________________________________________________  Estimated total number of nodules >/= 1 cm: 1  Number of spongiform nodules >/=  2 cm not described below (TR1): 0  Number of mixed cystic and solid nodules >/= 1.5 cm not described below (TR2): 0  _________________________________________________________  The previously biopsied thyroid nodule in the right inferior thyroid gland is stable from prior study and currently measures 2.7 x 2.1 x 1.5 cm (previously measuring 2.5 x 2 x 1.5 cm). There are no new worrisome thyroid nodule detected on this study. Multiple small cystic nodules are noted in the left  thyroid gland.  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 Unless clinically indicated, no further follow-up is required.  The above is in keeping with the ACR TI-RADS recommendations - J Am Coll Radiol 2017;14:587-595.  Electronically Signed   By: Constance Holster M.D.   On: 02/12/2020 15:18 Note: Reviewed        Physical Exam  General appearance: Well nourished, well developed, and well hydrated. In no apparent acute distress Mental status: Alert, oriented x 3 (person, place, & time)       Respiratory: No evidence of acute respiratory distress Eyes: PERLA Vitals: BP (!) 75/60   Pulse 87   Temp 98.2 F (36.8 C) (Temporal)   Resp 14   Ht 5' 11"  (1.803 m)   Wt 180 lb (81.6 kg)   SpO2 98%   BMI 25.10 kg/m  BMI: Estimated body mass  index is 25.1 kg/m as calculated from the following:   Height as of this encounter: 5' 11"  (1.803 m).   Weight as of this encounter: 180 lb (81.6 kg). Ideal: Ideal body weight: 75.3 kg (166 lb 0.1 oz) Adjusted ideal body weight: 77.8 kg (171 lb 9.7 oz)    Lumbar Exam  Skin & Axial Inspection:Well healed scar from previous spine surgery detected Alignment:Symmetrical Functional NWG:[id number] ROM Stability:No instability detected Muscle Tone/Strength:Functionally intact. No obvious neuro-muscular anomalies detected. Sensory (Neurological):Dermatomal pain pattern  Gait & Posture Assessment  Ambulation: Limited Gait:Limited. Using assistive device to ambulate Posture:Difficulty standing up straight, due to pain  Lower Extremity Exam    Side:Right lower extremity  Side:Left lower extremity  Stability:No instability observed  Stability:No instability observed  Skin & Extremity Inspection:Skin color, temperature, and hair growth are WNL. No peripheral edema or cyanosis. No masses, redness, swelling, asymmetry, or associated skin lesions. No  contractures.  Skin & Extremity Inspection:Evidence of prior arthroplastic surgery  Functional ROM: Right leg pain, right ankle pain   Functional HQI:ONGE restricted ROM   Muscle Tone/Strength:Functionally intact. No obvious neuro-muscular anomalies detected.  Muscle Tone/Strength:Functionally intact. No obvious neuro-muscular anomalies detected.  Sensory (Neurological): Musculoskeletal, acute from fall  Sensory (Neurological):Arthropathic arthralgia  DTR: Patellar:deferred today Achilles:deferred today Plantar:deferred today  DTR: Patellar:deferred today Achilles:deferred today Plantar:deferred today  Palpation:No palpable anomalies  Palpation:No palpable anomalies    Assessment   Status Diagnosis  Controlled Controlled Controlled 1. Lumbosacral radiculopathy   2. Primary osteoarthritis of left ankle   3. 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 Chronic, continuous use of opioids   5. Sciatica of left side associated with disorder of lumbar spine   6. Chronic pain syndrome   7. Facet arthritis of lumbar region   8. Chronic left-sided low back pain with left-sided sciatica        Plan of Care  [name] has a current medication list which includes the following long-term medication(s): bupropion, donepezil, fenofibrate, gabapentin, lisinopril-hydrochlorothiazide, memantine, metoprolol succinate, omeprazole, oxycodone-acetaminophen, primidone, quetiapine, rosuvastatin, vascepa, [START ON 03/31/2020] oxycodone-acetaminophen, and [START ON 04/30/2020] oxycodone-acetaminophen.  Pharmacotherapy (Medications Ordered): Meds ordered this encounter  Medications  . oxyCODONE-acetaminophen (PERCOCET) 10-325 MG tablet    Sig: Take 1 tablet by mouth every 6 (six) hours as needed for pain. Must last 30 days.    Dispense:  100 tablet    Refill:  0    Chronic Pain. (STOP Act -  Not applicable). Fill one day early if closed on scheduled refill date.  Marland Kitchen oxyCODONE-acetaminophen (PERCOCET) 10-325 MG tablet    Sig: Take 1 tablet by mouth every 6 (six) hours as needed for pain. Must last 30 days.    Dispense:  100 tablet    Refill:  0    Chronic Pain. (STOP Act - Not applicable). Fill one day early if closed on scheduled refill date.  Marland Kitchen oxyCODONE-acetaminophen (PERCOCET) 10-325 MG tablet    Sig: Take 1 tablet by mouth every 6 (six) hours as needed for pain. Must last 30 days.    Dispense:  100 tablet    Refill:  0    Chronic Pain. (STOP Act - Not applicable). Fill one day early if closed on scheduled refill date.    See nursing note regarding disposal of hydromorphone.  Pharmacy will be called to cancel any remaining prescription of hydromorphone.  Follow-up plan:   Return in about 3 months (around 06/01/2020) for Medication Management.  Recent Visits Date Type Provider Dept  02/04/20 Office Visit [name], MD [facility]  Showing recent visits within past 90 days and meeting all other requirements Today's Visits Date Type Provider Dept  03/01/20 Office Visit [name], MD [facility]  Showing today's visits and meeting all other requirements Future Appointments Date Type Provider Dept  05/26/20 Appointment [name], MD [facility]  Showing future appointments within next 90 days and meeting all other requirements  I discussed the assessment and treatment plan with the patient. The patient was provided an opportunity to ask questions and all were answered. The patient agreed with the plan and demonstrated an understanding of the instructions.  Patient advised to call back or seek an in-person evaluation if the symptoms or condition worsens.  Duration of encounter: 30 minutes.  Note by: [name], MD Date: 03/01/2020; Time: 9:58 AM

## 2020-03-05 DIAGNOSIS — R0789 Other chest pain: Secondary | ICD-10-CM | POA: Diagnosis not present

## 2020-03-05 DIAGNOSIS — S41101A Unspecified open wound of right upper arm, initial encounter: Secondary | ICD-10-CM | POA: Diagnosis not present

## 2020-03-05 DIAGNOSIS — W19XXXA Unspecified fall, initial encounter: Secondary | ICD-10-CM | POA: Diagnosis not present

## 2020-03-05 DIAGNOSIS — S2231XA Fracture of one rib, right side, initial encounter for closed fracture: Secondary | ICD-10-CM | POA: Diagnosis not present

## 2020-03-17 DIAGNOSIS — E041 Nontoxic single thyroid nodule: Secondary | ICD-10-CM | POA: Diagnosis not present

## 2020-03-18 ENCOUNTER — Other Ambulatory Visit: Payer: Self-pay | Admitting: Family Medicine

## 2020-04-05 ENCOUNTER — Ambulatory Visit: Payer: PPO | Admitting: Pharmacist

## 2020-04-05 DIAGNOSIS — F17200 Nicotine dependence, unspecified, uncomplicated: Secondary | ICD-10-CM | POA: Diagnosis not present

## 2020-04-05 DIAGNOSIS — F32A Depression, unspecified: Secondary | ICD-10-CM

## 2020-04-05 DIAGNOSIS — E782 Mixed hyperlipidemia: Secondary | ICD-10-CM

## 2020-04-05 DIAGNOSIS — N1831 Chronic kidney disease, stage 3a: Secondary | ICD-10-CM

## 2020-04-05 DIAGNOSIS — M545 Low back pain, unspecified: Secondary | ICD-10-CM

## 2020-04-05 DIAGNOSIS — R251 Tremor, unspecified: Secondary | ICD-10-CM | POA: Diagnosis not present

## 2020-04-05 DIAGNOSIS — F419 Anxiety disorder, unspecified: Secondary | ICD-10-CM

## 2020-04-05 DIAGNOSIS — E1142 Type 2 diabetes mellitus with diabetic polyneuropathy: Secondary | ICD-10-CM

## 2020-04-05 DIAGNOSIS — G479 Sleep disorder, unspecified: Secondary | ICD-10-CM | POA: Diagnosis not present

## 2020-04-05 DIAGNOSIS — R4189 Other symptoms and signs involving cognitive functions and awareness: Secondary | ICD-10-CM | POA: Diagnosis not present

## 2020-04-05 DIAGNOSIS — R262 Difficulty in walking, not elsewhere classified: Secondary | ICD-10-CM | POA: Diagnosis not present

## 2020-04-05 DIAGNOSIS — G8929 Other chronic pain: Secondary | ICD-10-CM

## 2020-04-05 MED ORDER — OZEMPIC (0.25 OR 0.5 MG/DOSE) 2 MG/1.5ML ~~LOC~~ SOPN: 0.5000 mg | PEN_INJECTOR | SUBCUTANEOUS | 3 refills | Status: DC

## 2020-04-05 MED ORDER — ICOSAPENT ETHYL 1 G PO CAPS: 2.0000 g | ORAL_CAPSULE | Freq: Two times a day (BID) | ORAL | 3 refills | Status: DC

## 2020-04-05 NOTE — Patient Instructions (Addendum)
Mr. Dillenbeck,   It was great talking with you today! As always, let me know if you have questions or concerns!  Catie Darnelle Maffucci, PharmD [phone]  Patient Care Plan: Medication Management    Problem Identified: Glycemic Management (Diabetes, Type 2)     Long-Range Goal: Glycemic Management Optimized   Start Date: 02/16/2020  Recent Progress: On track  Priority: Medium  Note:   Current Barriers:  . Unable to independently afford treatment regimen  Pharmacist Clinical Goal(s):  Marland Kitchen Over the next 30 days, patient will verbalize ability to afford treatment regimen through collaboration with PharmD and provider.  . Over the next 90 days, patient will maintain glycemic control as evidenced by A1c through collaboration with PharmD and provider.  Interventions: . Inter-disciplinary care team collaboration (see longitudinal plan of care) . Comprehensive medication review performed; medication list updated in electronic medical record  Diabetes: . Controlled; current treatment: Jardiance 25 mg daily, Ozempic 0.5 mg weekly (wife is using Ozempic 1 mg pen and clicking halfway to 1 mg, delivering 0.5 mg) o Over income for Jardiance assistance o Due to reapply for Ozempic assistance for 2022 . Current glucose readings Average Glucose for 7 days: 104 mg/dL; 14 days 100 Time in Goal:  - Time in range 70-180: 89% - Time above range: 0% - Time below range: 11% (though denies symptoms, notes this is overnight) . Recommended to continue current regiment . Collaborated with PCP, CPhT, and patient to start the reapplication process for patient assistance for Ozempic 0.5 mg [employer])  Hypertension: . Controlled; current treatment: lisinopril/HCTZ 20/25 mg daily, metoprolol succinate 25 mg daily . Recommended to continue current regimen  Hyperlipidemia: . Controlled; current treatment: rosuvastatin 40 mg daily, fenofibrate 145 mg daily, Vascepa 2 g BID; reports needing a refill on Vascepa  today o [employer] for [employer] . Recommended to continue current regimen. Refill sent on Vascepa . Recommend updated lipid panel at next lab work  Chronic Pain w/ recent ankle surgery: . Improved per patient report; oxycodone/APAP 5/325 mg up to TID, gabapenin 1200 mg TID. Follows w/ Dr. Holley Raring . Recommend to continue collaboration w/ Pain Management  Neurologic Conditions (pseudo dementia, tremor, sleep disorder) . Well managed per patient report; current regimen: memantine 10 mg BID, donepezil 10 mg daily; primidone 250 mg QAM, 150 mg QPM; divalproex 500 mg BID, quetiapine 125 mg QPM w/ extra 25 mg if needed; Follows w/ Dr. Melrose Nakayama . Recommended to continue collaboration w/ Neurology  Chronic Diarrhea/Colitis: . Controlled per patient report; current regimen: budesonide 9 mg daily, Lomotil 2 tabs up to QID PRN; follows w/ [facility] . Recommended to continue collaboration w/ GI  Patient Goals/Self-Care Activities . Over the next 30 days, patient will:  - take medications as prescribed collaborate with provider on medication access solutions  Follow Up Plan: Telephone follow up appointment with care management team member scheduled for: ~ 8 weeks    The patient verbalized understanding of instructions, educational materials, and care plan provided today and agreed to receive a mailed copy of patient instructions, educational materials, and care plan.    Plan: Telephone follow up appointment with care management team member scheduled for: 06/15/20  Catie Darnelle Maffucci, PharmD, Para [place], [place] Pharmacist [place] [phone]

## 2020-04-05 NOTE — Chronic Care Management (AMB) (Signed)
Chronic Care Management   Pharmacy Note  04/05/2020 Name: James Hood MRN: [id number] DOB: 05-Dec-1940   Subjective:  James Hood is a 79 y.o. year old male who is a primary care patient of Caryl Bis, Angela Adam, MD. The CCM team was consulted for assistance with chronic disease management and care coordination needs.    Engaged with patient by telephone for follow up visit in response to provider referral for pharmacy case management and/or care coordination services.   Consent to Services:  Mr. Dimmer was given information about Chronic Care Management services, agreed to services, and gave verbal consent prior to initiation of services on 03/02/2019. Please see initial visit note for detailed documentation.   Review of patient status, including review of consultants reports, laboratory and other test data, was performed as part of comprehensive evaluation and provision of chronic care management services.   SDOH (Social Determinants of Health) assessments and interventions performed:  SDOH Interventions     Most Recent Value  SDOH Interventions  Financial Strain Interventions Other (Comment)  [manufacturer assistance]       Objective:  Lab Results  Component Value Date   CREATININE 1.26 (H) 09/25/2019   CREATININE 1.46 07/20/2019   CREATININE 1.20 05/25/2019    Lab Results  Component Value Date   HGBA1C 6.4 (A) 02/05/2020       Component Value Date/Time   CHOL 120 05/25/2019 0828   TRIG 257.0 (H) 05/25/2019 0828   HDL 40.50 05/25/2019 0828   CHOLHDL 3 05/25/2019 0828   VLDL 51.4 (H) 05/25/2019 0828   LDLCALC 57 09/25/2017 0924   LDLDIRECT 46.0 05/25/2019 0828     Clinical ASCVD: No  The ASCVD Risk score (Goff DC Jr., et al., 2013) failed to calculate for the following reasons:   The 2013 ASCVD risk score is only valid for ages 19 to 32      BP Readings from Last 3 Encounters:  03/01/20 (!) 75/60  02/05/20 120/70  02/04/20 109/64     Assessment:   No Known Allergies  Medications Reviewed Today    Reviewed by De Hollingshead, RPH-CPP (Pharmacist) on 04/05/20 at 1020  Med List Status: <None>  Medication Order Taking? Sig Documenting Provider Last Dose Status Informant  aspirin EC 81 MG tablet [id number] Yes Take 81 mg by mouth daily. [provider] Taking Active            Med Note Vanessa [place], SUSAN   Tue Apr 15, 2018  8:47 AM)    budesonide (ENTOCORT EC) 3 MG 24 hr capsule [id number] Yes Take 9 mg by mouth daily. [provider] Taking Active   buPROPion [facility] SR) 150 MG 12 hr tablet [id number] Yes TAKE ONE TABLET BY MOUTH TWICE A DAY Caryl Bis Angela Adam, MD Taking Active   Continuous Blood Gluc Receiver (FREESTYLE LIBRE 14 DAY READER) [place] [id number] Yes USE DAILY AS DIRECED TO CHECK BLOOD GLUCOSE Leone Haven, MD Taking Active   Continuous Blood Gluc Sensor (FREESTYLE LIBRE 14 DAY SENSOR) MISC [id number] Yes APPLY ONE SENSOR EVERY 14 DAYS TO MONITOR BLOOD GLUOCSE LEVELS. REMOVE OLD SENSOR BEFORE APPLYING NEW SENSOR. Leone Haven, MD Taking Active   diphenoxylate-atropine (LOMOTIL) 2.5-0.025 MG tablet [id number] Yes Take 1 tablet by mouth 2 (two) times daily.  [provider] Taking Active Self  divalproex (DEPAKOTE ER) 500 MG 24 hr tablet [id number] Yes Take 1 tablet by mouth 2 (two) times daily. [provider] Taking Active   donepezil (ARICEPT) 10  MG tablet [id number] Yes Take 10 mg by mouth at bedtime. [provider] Taking Active Self  empagliflozin (JARDIANCE) 25 MG TABS tablet [id number] Yes Take 1 tablet (25 mg total) by mouth daily. Leone Haven, MD Taking Active   fenofibrate (TRICOR) 145 MG tablet [id number] Yes TAKE ONE TABLET BY MOUTH DAILY Leone Haven, MD Taking Active   finasteride (PROSCAR) 5 MG tablet [id number] Yes TAKE ONE TABLET BY MOUTH DAILY Leone Haven, MD Taking Active   gabapentin (NEURONTIN) 600 MG tablet [id number] Yes  TAKE TWO TABLETS BY MOUTH THREE TIMES A DAY Leone Haven, MD Taking Active   HYDROmorphone (DILAUDID) 4 MG tablet [id number] No Take 1 tablet (4 mg total) by mouth every 8 (eight) hours as needed for severe pain. Must last 30 days.  Patient not taking: Reported on 04/05/2020   [name], MD Not Taking Active   lisinopril-hydrochlorothiazide (ZESTORETIC) 20-25 MG tablet [id number] Yes TAKE ONE TABLET BY MOUTH TWICE A DAY Leone Haven, MD Taking Active   meloxicam (MOBIC) 7.5 MG tablet [id number] Yes TAKE ONE TABLET BY MOUTH DAILY WITH FOOD AS NEEDED FOR PAIN Leone Haven, MD Taking Active   memantine (NAMENDA) 10 MG tablet [id number] Yes TAKE ONE TABLET BY MOUTH TWICE A DAY [provider] Taking Active   metoprolol succinate (TOPROL-XL) 25 MG 24 hr tablet [id number] Yes Take 25 mg by mouth daily. [provider] Taking Active   Multiple Vitamins-Minerals (CENTRUM SILVER PO) [id number] Yes Take 1 tablet by mouth daily. [provider] Taking Active   omeprazole (PRILOSEC) 20 MG capsule [id number] Yes Take 20 mg by mouth 2 (two) times daily before a meal.  [provider] Taking Active Self           Med Note Josiah Lobo, KIMBERLY   Thu Aug 09, 2016 12:05 PM)    oxyCODONE-acetaminophen (PERCOCET) 10-325 MG tablet [id number]  Take 1 tablet by mouth every 6 (six) hours as needed for pain. Must last 30 days. [name], MD  Expired 03/31/20 2359   oxyCODONE-acetaminophen (PERCOCET) 10-325 MG tablet [id number] Yes Take 1 tablet by mouth every 6 (six) hours as needed for pain. Must last 30 days. [name], MD Taking Active   oxyCODONE-acetaminophen (PERCOCET) 10-325 MG tablet [id number]  Take 1 tablet by mouth every 6 (six) hours as needed for pain. Must last 30 days. [name], MD  Active   primidone (MYSOLINE) 50 MG tablet [id number] Yes Take 150-250 mg by mouth 2 (two) times daily. Take 250 mg by mouth in the morning & take 150 mg at night. [provider] Taking Active Self  QUEtiapine (SEROQUEL) 25 MG tablet [id number] Yes Take 125 mg by mouth at bedtime. Can take an additional 25 mg if he wakes up overnight [provider] Taking Active Self  rosuvastatin (CRESTOR) 40 MG tablet [id number] Yes TAKE ONE TABLET BY MOUTH DAILY Leone Haven, MD Taking Active   Semaglutide, 1 MG/DOSE, (OZEMPIC, 1 MG/DOSE,) 4 MG/3ML SOPN [id number] Yes Inject 1 mg into the skin once a week. 10 clicks less than 1 mg [provider] Taking Active   tamsulosin (FLOMAX) 0.4 MG CAPS capsule [id number] Yes TAKE ONE TABLET BY MOUTH DAILY AFTER SUPPER Leone Haven, MD Taking Active   VASCEPA 1 g capsule [id number] Yes TAKE TWO CAPSULES BY MOUTH TWICE A DAY Leone Haven, MD Taking Active   Med List Note Landis Martins, RN 03/01/20 0919): MR 05-30-20 UDS 10-06-2019  Patient Active Problem List   Diagnosis Date Noted  . Right leg pain 02/05/2020  . Impingement of left ankle joint   . Sleeping difficulty 08/24/2019  . Hiccups 08/24/2019  . Chronic pain of left ankle 02/27/2019  . Vitamin D deficiency 02/18/2019  . Left shoulder pain 02/02/2019  . Neuropathy 09/24/2018  . Lumbosacral radiculopathy 01/14/2018  . 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 Facet arthritis of lumbar region 01/14/2018  . Chronic pain syndrome 08/26/2017  . 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 Thyroid nodule 02/27/2017  . Fatty liver 02/27/2017  . Purpura ([place]) 01/29/2017  . Right hip pain 08/16/2016  . Lumbar herniated disc 02/03/2016  . Hypertriglyceridemia 12/09/2015  . Anemia 12/09/2015  . Falls 09/16/2015  . Barrett's esophagus 09/16/2015  . Chronic lumbar pain 09/16/2015  . Benign prostatic hyperplasia 09/16/2015  . Trochanteric bursitis of left hip 09/09/2015  . Headache 09/09/2015  . Difficulty in walking 06/01/2015  . Amnesia 06/01/2015  . HLD (hyperlipidemia) 02/23/2015  . DJD (degenerative joint  disease) of knee 11/15/2014  . Hypertension   . Tremor, essential   . Primary localized osteoarthritis of right knee   . Anxiety and depression   . Absence of sensation 11/18/2013  . Tobacco abuse 11/18/2013  . Chronic diarrhea 11/12/2013  . Benign neoplasm of colon 08/25/2013  . 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 Type 2 diabetes mellitus ([place]) 10/06/2012    Medication Assistance: Application for Ozempic through [employer] medication assistance program in process. Anticipated assistance start date 05/2020. See plan of care below for additional detail.   Patient Care Plan: Medication Management    Problem Identified: Glycemic Management (Diabetes, Type 2)     Long-Range Goal: Glycemic Management Optimized   Start Date: 02/16/2020  Recent Progress: On track  Priority: Medium  Note:   Current Barriers:  . Unable to independently afford treatment regimen  Pharmacist Clinical Goal(s):  Marland Kitchen Over the next 30 days, patient will verbalize ability to afford treatment regimen through collaboration with PharmD and provider.  . Over the next 90 days, patient will maintain glycemic control as evidenced by A1c through collaboration with PharmD and provider.  Interventions: . Inter-disciplinary care team collaboration (see longitudinal plan of care) . Comprehensive medication review performed; medication list updated in electronic medical record  Diabetes: . Controlled; current treatment: Jardiance 25 mg daily, Ozempic 0.5 mg weekly (wife is using Ozempic 1 mg pen and clicking halfway to 1 mg, delivering 0.5 mg) o Over income for Jardiance assistance o Due to reapply for Ozempic assistance for 2022 . Current glucose readings Average Glucose for 7 days: 104 mg/dL; 14 days 100 Time in Goal:  - Time in range 70-180: 89% - Time above range: 0% - Time below range: 11% (though denies symptoms, notes this is overnight) . Recommended to continue current regiment . Collaborated with PCP, CPhT,  and patient to start the reapplication process for patient assistance for Ozempic 0.5 mg [employer])  Hypertension: . Controlled; current treatment: lisinopril/HCTZ 20/25 mg daily, metoprolol succinate 25 mg daily . Recommended to continue current regimen  Hyperlipidemia: . Controlled; current treatment: rosuvastatin 40 mg daily, fenofibrate 145 mg daily, Vascepa 2 g BID; reports needing a refill on Vascepa today o [employer] for [employer] . Recommended to continue current regimen. Refill sent on Vascepa . Recommend updated lipid panel at next lab work  Chronic Pain w/ recent ankle surgery: . Improved per patient report; oxycodone/APAP 5/325 mg up to TID, gabapenin  1200 mg TID. Follows w/ Dr. Holley Raring . Recommend to continue collaboration w/ Pain Management  Neurologic Conditions (pseudo dementia, tremor, sleep disorder) . Well managed per patient report; current regimen: memantine 10 mg BID, donepezil 10 mg daily; primidone 250 mg QAM, 150 mg QPM; divalproex 500 mg BID, quetiapine 125 mg QPM w/ extra 25 mg if needed; Follows w/ Dr. Melrose Nakayama . Recommended to continue collaboration w/ Neurology  Chronic Diarrhea/Colitis: . Controlled per patient report; current regimen: budesonide 9 mg daily, Lomotil 2 tabs up to QID PRN; follows w/ [facility] . Recommended to continue collaboration w/ GI  Patient Goals/Self-Care Activities . Over the next 30 days, patient will:  - take medications as prescribed collaborate with provider on medication access solutions  Follow Up Plan: Telephone follow up appointment with care management team member scheduled for: ~ 8 weeks      Plan: Telephone follow up appointment with care management team member scheduled for: 06/15/20  Catie Darnelle Maffucci, PharmD, BCACP, CPP Clinical Pharmacist [place] [place] [phone]

## 2020-04-07 ENCOUNTER — Telehealth: Payer: Self-pay

## 2020-04-07 ENCOUNTER — Telehealth: Payer: PPO

## 2020-04-07 DIAGNOSIS — R4189 Other symptoms and signs involving cognitive functions and awareness: Secondary | ICD-10-CM

## 2020-04-07 NOTE — Telephone Encounter (Signed)
I called and informed the patient that the referral was put in and it would be local and they would call him to schedule, he understood.  Haward Pope,cma

## 2020-04-07 NOTE — Telephone Encounter (Signed)
Pt requested a referral to see a psychiatrist due to a recent diagnosis of a type of dementia?  Pt states that he does not have a preference. Please advise

## 2020-04-07 NOTE — Telephone Encounter (Signed)
Pt requested a referral to see a psychiatrist due to a recent diagnosis of a type of dementia?  Pt states that he does not have a preference. Please advise.  Sarabeth Benton,cma

## 2020-04-07 NOTE — Telephone Encounter (Signed)
Referral placed.

## 2020-04-15 ENCOUNTER — Other Ambulatory Visit: Payer: Self-pay | Admitting: Family Medicine

## 2020-04-19 ENCOUNTER — Encounter: Payer: PPO | Admitting: Student in an Organized Health Care Education/Training Program

## 2020-04-21 ENCOUNTER — Other Ambulatory Visit: Payer: Self-pay | Admitting: Family Medicine

## 2020-04-24 ENCOUNTER — Other Ambulatory Visit: Payer: Self-pay | Admitting: Family Medicine

## 2020-04-24 DIAGNOSIS — E782 Mixed hyperlipidemia: Secondary | ICD-10-CM

## 2020-04-28 ENCOUNTER — Other Ambulatory Visit: Payer: Self-pay | Admitting: Family Medicine

## 2020-05-09 ENCOUNTER — Other Ambulatory Visit: Payer: Self-pay

## 2020-05-09 ENCOUNTER — Ambulatory Visit (INDEPENDENT_AMBULATORY_CARE_PROVIDER_SITE_OTHER): Payer: PPO | Admitting: Family Medicine

## 2020-05-09 ENCOUNTER — Other Ambulatory Visit: Payer: Self-pay | Admitting: Family Medicine

## 2020-05-09 ENCOUNTER — Encounter: Payer: Self-pay | Admitting: Family Medicine

## 2020-05-09 VITALS — BP 110/70 | HR 74 | Temp 97.8°F | Ht 71.0 in | Wt 172.2 lb

## 2020-05-09 DIAGNOSIS — E1142 Type 2 diabetes mellitus with diabetic polyneuropathy: Secondary | ICD-10-CM | POA: Diagnosis not present

## 2020-05-09 DIAGNOSIS — I10 Essential (primary) hypertension: Secondary | ICD-10-CM | POA: Diagnosis not present

## 2020-05-09 DIAGNOSIS — R278 Other lack of coordination: Secondary | ICD-10-CM

## 2020-05-09 LAB — COMPREHENSIVE METABOLIC PANEL
ALT: 14 U/L (ref 0–53)
AST: 19 U/L (ref 0–37)
Albumin: 3.9 g/dL (ref 3.5–5.2)
Alkaline Phosphatase: 51 U/L (ref 39–117)
BUN: 32 mg/dL — ABNORMAL HIGH (ref 6–23)
CO2: 32 mEq/L (ref 19–32)
Calcium: 9.2 mg/dL (ref 8.4–10.5)
Chloride: 99 mEq/L (ref 96–112)
Creatinine, Ser: 1.28 mg/dL (ref 0.40–1.50)
GFR: 52.94 mL/min — ABNORMAL LOW (ref >60.00)
Glucose, Bld: 123 mg/dL — ABNORMAL HIGH (ref 70–99)
Potassium: 4.4 mEq/L (ref 3.5–5.1)
Sodium: 138 mEq/L (ref 135–145)
Total Bilirubin: 0.3 mg/dL (ref 0.2–1.2)
Total Protein: 6.2 g/dL (ref 6.0–8.3)

## 2020-05-09 LAB — VITAMIN B12: Vitamin B-12: 346 pg/mL (ref 211–911)

## 2020-05-09 LAB — HEMOGLOBIN A1C: Hgb A1c MFr Bld: 6.7 % — ABNORMAL HIGH (ref 4.6–6.5)

## 2020-05-09 NOTE — Assessment & Plan Note (Signed)
Adequate control.  Patient will continue lisinopril-HCTZ 20-25 mg once daily and metoprolol 25 mg daily.

## 2020-05-09 NOTE — Patient Instructions (Signed)
Nice to see you. We will get labs and try to get you scheduled for an MRI. We will also refer you for physical therapy for your balance.

## 2020-05-09 NOTE — Assessment & Plan Note (Signed)
Seems to be well controlled.  He will continue Jardiance 25 mg once daily and Ozempic 0.5 mg once weekly.

## 2020-05-09 NOTE — Progress Notes (Signed)
Tommi Rumps, MD Phone: [phone]  James Hood is a 79 y.o. male who presents today for follow-up.  Diabetes: 90-day average of 118.  Taking Jardiance and Ozempic.  Does note some polyuria.  He has stopped drinking soda.  Hypertension: Not checking blood pressures.  He is taking lisinopril, HCTZ, and metoprolol.  No chest pain, shortness of breath, or edema.  Falls: Patient notes several falls over the last several months.  3 to 4 months ago while he was on morphine he had a fall related to him feeling loopy.  He is no longer taking morphine.  He hit his head when he fell though had no loss of consciousness.  He has also fallen since then as he reports he loses his balance easily.  He notes there seems to be no specific cause for the loss of balance.  He notes no dizziness or lightheadedness.  He just feels as though his balance is off.  He notes that he does not think his chronic left ankle pain is playing a role in this.  He has done physical therapy for balance in the past.  Social History   Tobacco Use  Smoking Status Current Every Day Smoker  . Packs/day: 0.50  . Years: 60.00  . Pack years: 30.00  . Types: Cigarettes  Smokeless Tobacco Never Used     ROS see history of present illness  Objective  Physical Exam Vitals:   05/09/20 0924  BP: 110/70  Pulse: 74  Temp: 97.8 F (36.6 C)  SpO2: 96%    BP Readings from Last 3 Encounters:  05/09/20 110/70  03/01/20 (!) 75/60  02/05/20 120/70   Wt Readings from Last 3 Encounters:  05/09/20 172 lb 3.2 oz (78.1 kg)  03/01/20 180 lb (81.6 kg)  02/05/20 172 lb (78 kg)    Physical Exam Constitutional:      General: He is not in acute distress.    Appearance: He is not diaphoretic.  Cardiovascular:     Rate and Rhythm: Normal rate and regular rhythm.     Heart sounds: Normal heart sounds.  Pulmonary:     Effort: Pulmonary effort is normal.     Breath sounds: Normal breath sounds.  Musculoskeletal:         General: No edema.  Skin:    General: Skin is warm and dry.  Neurological:     Mental Status: He is alert.     Comments: EOMI, PERRL, hearing intact to finger rub, shoulder shrug intact, V1 through V3 intact bilaterally to light touch, opens and closes eyes adequately, 5/5 strength in bilateral biceps, triceps, grip, quads, hamstrings, plantar and dorsiflexion, sensation to light touch intact in bilateral UE and LE, normal slow with slight limp favoring the right, absent patellar reflexes, 2+ biceps and brachioradialis reflexes bilaterally, finger-to-nose intact, rapid alternating movements intact, patient unable to complete Romberg due to loss of balance with placing his feet together, positional sense of great toe intact bilaterally      Assessment/Plan: Please see individual problem list.  Problem List Items Addressed This Visit    Hypertension    Adequate control.  Patient will continue lisinopril-HCTZ 20-25 mg once daily and metoprolol 25 mg daily.      Sensory ataxia - Primary    Patient with numerous falls.  Exam concerning for sensory ataxia given positive Romberg.  Discussed lab work-up as outlined.  Imaging to be obtained as well given remote head injury.  We will also refer for physical  therapy.  Discussed having him follow-up with his neurologist on this as well particularly do not find a cause.      Relevant Orders   RPR   B12   MR Brain Wo Contrast   Ambulatory referral to Physical Therapy   Type 2 diabetes mellitus ([place])    Seems to be well controlled.  He will continue Jardiance 25 mg once daily and Ozempic 0.5 mg once weekly.      Relevant Orders   Comp Met (CMET)   HgB A1c       This visit occurred during the SARS-CoV-2 public health emergency.  Safety protocols were in place, including screening questions prior to the visit, additional usage of staff PPE, and extensive cleaning of exam room while observing appropriate contact time as indicated for disinfecting  solutions.    Tommi Rumps, MD James Hood

## 2020-05-09 NOTE — Assessment & Plan Note (Signed)
Patient with numerous falls.  Exam concerning for sensory ataxia given positive Romberg.  Discussed lab work-up as outlined.  Imaging to be obtained as well given remote head injury.  We will also refer for physical therapy.  Discussed having him follow-up with his neurologist on this as well particularly do not find a cause.

## 2020-05-10 LAB — RPR: RPR Ser Ql: NONREACTIVE

## 2020-05-17 ENCOUNTER — Telehealth: Payer: Self-pay | Admitting: Family Medicine

## 2020-05-17 NOTE — Telephone Encounter (Signed)
lft vm for pt to call [phone] option 3 and then 2 to sch MRI °

## 2020-05-26 ENCOUNTER — Encounter: Payer: Self-pay | Admitting: Student in an Organized Health Care Education/Training Program

## 2020-05-26 ENCOUNTER — Ambulatory Visit
Payer: PPO | Attending: Student in an Organized Health Care Education/Training Program | Admitting: Student in an Organized Health Care Education/Training Program

## 2020-05-26 ENCOUNTER — Other Ambulatory Visit: Payer: Self-pay

## 2020-05-26 ENCOUNTER — Other Ambulatory Visit: Payer: Self-pay | Admitting: Family Medicine

## 2020-05-26 VITALS — BP 112/75 | HR 74 | Temp 97.4°F | Resp 16 | Ht 71.0 in | Wt 170.0 lb

## 2020-05-26 DIAGNOSIS — G894 Chronic pain syndrome: Secondary | ICD-10-CM | POA: Diagnosis not present

## 2020-05-26 DIAGNOSIS — Z720 Tobacco use: Secondary | ICD-10-CM

## 2020-05-26 DIAGNOSIS — M19072 Primary osteoarthritis, left ankle and foot: Secondary | ICD-10-CM | POA: Diagnosis not present

## 2020-05-26 DIAGNOSIS — T85192S Other mechanical complication of implanted electronic neurostimulator (electrode) of spinal cord, sequela: Secondary | ICD-10-CM | POA: Diagnosis not present

## 2020-05-26 DIAGNOSIS — M5442 Lumbago with sciatica, left side: Secondary | ICD-10-CM | POA: Diagnosis not present

## 2020-05-26 DIAGNOSIS — F1190 Opioid use, unspecified, uncomplicated: Secondary | ICD-10-CM | POA: Diagnosis not present

## 2020-05-26 DIAGNOSIS — G8929 Other chronic pain: Secondary | ICD-10-CM | POA: Diagnosis not present

## 2020-05-26 DIAGNOSIS — M5386 Other specified dorsopathies, lumbar region: Secondary | ICD-10-CM

## 2020-05-26 DIAGNOSIS — E1142 Type 2 diabetes mellitus with diabetic polyneuropathy: Secondary | ICD-10-CM

## 2020-05-26 DIAGNOSIS — M47816 Spondylosis without myelopathy or radiculopathy, lumbar region: Secondary | ICD-10-CM

## 2020-05-26 DIAGNOSIS — M5417 Radiculopathy, lumbosacral region: Secondary | ICD-10-CM | POA: Diagnosis not present

## 2020-05-26 MED ORDER — HYDROCODONE-ACETAMINOPHEN 10-325 MG PO TABS: 1.0000 | ORAL_TABLET | ORAL | 0 refills | Status: DC | PRN

## 2020-05-26 MED ORDER — HYDROCODONE-ACETAMINOPHEN 10-325 MG PO TABS: 1.0000 | ORAL_TABLET | ORAL | 0 refills | Status: AC | PRN

## 2020-05-26 NOTE — Patient Instructions (Signed)
Hydrocodone/APAP TO LAST UNTIL 07/29/20 HAS BEEN ESCRIBED TO YOUR PHARMACY.

## 2020-05-26 NOTE — Progress Notes (Signed)
Nursing Pain Medication Assessment:  Safety precautions to be maintained throughout the outpatient stay will include: orient to surroundings, keep bed in low position, maintain call bell within reach at all times, provide assistance with transfer out of bed and ambulation.  Medication Inspection Compliance: Pill count conducted under aseptic conditions, in front of the patient. Neither the pills nor the bottle was removed from the patient's sight at any time. Once count was completed pills were immediately returned to the patient in their original bottle.  Medication: Oxycodone/APAP Pill/Patch Count: 45 of 100 pills remain Pill/Patch Appearance: Markings consistent with prescribed medication Bottle Appearance: Standard pharmacy container. Clearly labeled. Filled Date: 53 / 11 / 2021 Last Medication intake:  Today

## 2020-05-26 NOTE — Progress Notes (Signed)
PROVIDER NOTE: Information contained herein reflects review and annotations entered in association with encounter. Interpretation of such information and data should be left to medically-trained personnel. Information provided to patient can be located elsewhere in the medical record under "Patient Instructions". Document created using STT-dictation technology, any transcriptional errors that may result from process are unintentional.    Patient: James Hood  Service Category: E/M  Provider: [name], MD  DOB: [birth year]  DOS: 05/26/2020  Specialty: Interventional Pain Management  MRN: [id number]  Setting: Ambulatory outpatient  PCP: Leone Haven, MD  Type: Established Patient    Referring Provider: Leone Haven, MD  Location: Office  Delivery: Face-to-face     HPI  Mr. TYLAND KLEMENS, a 80 y.o. year old male, is here today because of his Lumbosacral radiculopathy [M54.17]. Mr. Ancrum's primary complain today is Hip Pain (left) and Ankle Pain (left) Last encounter: My last encounter with him was on 03/01/2020. Pertinent problems: Mr. Hyde has Primary localized osteoarthritis of right knee; DJD (degenerative joint disease) of knee; Chronic lumbar pain; Lumbar herniated disc; Chronic pain syndrome; Failed back surgical syndrome; Lumbosacral radiculopathy; Facet arthritis of lumbar region; Neuropathy; Chronic pain of left ankle; and Impingement of left ankle joint on their pertinent problem list. Pain Assessment: Severity of Chronic pain is reported as a 6 /10. Location: Hip Left/and left ankle ; sometimes pain radiates from ankle to knee. Onset: More than a month ago. Quality: Aching. Timing: Constant. Modifying factor(s): meds "help some". Vitals:  height is 5' 11"  (1.803 m) and weight is 170 lb (77.1 kg). His temporal temperature is 97.4 F (36.3 C) (abnormal). His blood pressure is 112/75 and his pulse is 74. His respiration is 16 and oxygen saturation is 97%.   Reason for  encounter: medication management.   Patient presents today for medication management.  States that he has been having increased falls.  At his previous visit, we thought this was due to a medication change from oxycodone to hydromorphone and he was changed back to his previous dose of oxycodone however he continues to have falls.  He is scheduled for a brain MRI.  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  He continues to have left ankle pain.  He is requesting an increase in his medications but I advised against that in the context of him having more falls and not responding in the past to opioid escalation.  We discussed opioid rotation to hydrocodone at an equal analgesic dose of 60 MME which would be 10 mg every 4 hours as needed.  This is not a dose escalation from his current dose of Percocet however does allow [name] to utilize his opioid therapy every 4 hours.  We will see how he does with this.  Follow-up in 2 months.  Pharmacotherapy Assessment   Analgesic: Transition to hydrocodone 10 mg every 4 hours as needed, quantity 180/month; MME equals 60  Monitoring: [place] PMP: PDMP reviewed during this encounter.       Pharmacotherapy: No side-effects or adverse reactions reported. Compliance: No problems identified. Effectiveness: Clinically acceptable.  [name], RN  05/26/2020 10:50 AM  Sign when Signing Visit Nursing Pain Medication Assessment:  Safety precautions to be maintained throughout the outpatient stay will include: orient to surroundings, keep bed in low position, maintain call bell within reach at all times, provide assistance with transfer out of bed and ambulation.  Medication Inspection Compliance: Pill count conducted under aseptic  conditions, in front of the patient. Neither the pills nor the bottle was removed from the patient's sight at any time. Once count was completed pills were immediately returned to the patient in their original  bottle.  Medication: Oxycodone/APAP Pill/Patch Count: 45 of 100 pills remain Pill/Patch Appearance: Markings consistent with prescribed medication Bottle Appearance: Standard pharmacy container. Clearly labeled. Filled Date: 51 / 11 / 2021 Last Medication intake:  Today    UDS:  Summary  Date Value Ref Range Status  10/06/2019 Note  Final    Comment:    ==================================================================== [employer] (MW) ==================================================================== Test                             Result       Flag       Units Drug Present   Oxycodone                      900                     ng/mg creat   Oxymorphone                    174                     ng/mg creat   Noroxycodone                   1620                    ng/mg creat    Sources of oxycodone include scheduled prescription medications.    Oxymorphone and noroxycodone are expected metabolites of oxycodone.    Oxymorphone is also available as a scheduled prescription medication.   Phenobarbital                  PRESENT    Phenobarbital may be administered as a prescription drug; it is also    an expected metabolite of primidone and mephobarbital. ==================================================================== Test                      Result    Flag   Units      Ref Range   Creatinine              35               mg/dL      >=20 ==================================================================== Declared Medications:  Medication list was not provided. ==================================================================== For clinical consultation, please call [phone]. ====================================================================      04/30/2020  03/01/2020   1  Oxycodone-Acetaminophen 10-325  100.00  25  Bi Lat  [id number]  Har ([id number])  0/0  60.00 MME  Medicare  [place]      ROS  Constitutional: Denies any fever or chills Gastrointestinal:  No reported hemesis, hematochezia, vomiting, or acute GI distress Musculoskeletal: low back pain left hip pain, left ankle pain Neurological: No reported episodes of acute onset apraxia, aphasia, dysarthria, agnosia, amnesia, paralysis, loss of coordination, or loss of consciousness  Medication Review  FreeStyle Libre 14 Day Reader, FreeStyle Libre 14 Day Sensor, HYDROcodone-acetaminophen, Multiple Vitamins-Minerals, QUEtiapine, Semaglutide(0.25 or 0.5MG/DOS), aspirin EC, buPROPion, budesonide, diphenoxylate-atropine, divalproex, donepezil, empagliflozin, fenofibrate, finasteride, gabapentin, lisinopril-hydrochlorothiazide, memantine, metoprolol succinate, omeprazole, primidone, rosuvastatin, and tamsulosin  History Review  Allergy: Mr. Texidor has No Known Allergies. Drug: Mr. Doty  reports no history of drug use. Alcohol:  reports previous alcohol use. Tobacco:  reports that he has been smoking cigarettes. He has a 30.00 pack-year smoking history. He has never used smokeless tobacco. Social: Mr. Eggert  reports that he has been smoking cigarettes. He has a 30.00 pack-year smoking history. He has never used smokeless tobacco. He reports previous alcohol use. He reports that he does not use drugs. 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 Surgical: Mr. 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 Family: family history includes Depression in his father; Heart attack in his mother; Heart disease in his mother; Hypertension in his father.  Laboratory Chemistry Profile   Renal Lab Results  Component Value Date   BUN 32 (H) 05/09/2020   CREATININE 1.28 05/09/2020   GFR 52.94 (L) 05/09/2020   GFRAA >60 09/25/2019   GFRNONAA 54 (L) 09/25/2019     Hepatic Lab Results  Component Value Date   AST 19 05/09/2020   ALT 14 05/09/2020   ALBUMIN 3.9 05/09/2020   ALKPHOS 51 05/09/2020   AMMONIA 14 08/09/2016     Electrolytes Lab Results  Component Value Date   NA 138 05/09/2020   K 4.4 05/09/2020   CL 99 05/09/2020   CALCIUM 9.2 05/09/2020     Bone Lab Results  Component Value Date   VD25OH 42.83 04/07/2019     Inflammation (CRP: Acute Phase) (ESR: Chronic Phase) Lab Results  Component Value Date   ESRSEDRATE 15 07/20/2019       Note: Above Lab results reviewed.  Recent Imaging Review  US THYROID CLINICAL DATA:  Thyroid nodule follow-up  EXAM: THYROID ULTRASOUND  TECHNIQUE: Ultrasound examination of the thyroid gland and adjacent soft tissues was performed.  COMPARISON:  02/23/2019  FINDINGS: Parenchymal Echotexture: Moderately heterogenous  Isthmus: 0.5 cm  Right lobe: 4.1 x 2 x 2.3 cm  Left lobe: 3.4 x 1.8 x 1.7 cm  _________________________________________________________  Estimated total number of nodules >/= 1 cm: 1  Number of spongiform nodules >/=  2 cm not described below (TR1): 0  Number of mixed cystic and solid nodules >/= 1.5 cm not described below (TR2):  0  _________________________________________________________  The previously biopsied thyroid nodule in the right inferior thyroid gland is stable from prior study and currently measures 2.7 x 2.1 x 1.5 cm (previously measuring 2.5 x 2 x 1.5 cm). There are no new worrisome thyroid nodule detected on this study. Multiple small cystic nodules are noted in the left thyroid gland.  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 Unless clinically indicated, no further follow-up is required.  The above is in keeping with the ACR TI-RADS recommendations - J Am Coll Radiol 2017;14:587-595.  Electronically Signed   By: Constance Holster M.D.   On: 02/12/2020 15:18 Note: Reviewed  Physical Exam  General appearance: Well nourished, well developed, and well hydrated. In no apparent acute distress Mental status: Alert, oriented x 3 (person, place, & time)       Respiratory: No evidence of acute respiratory distress Eyes: PERLA Vitals: BP 112/75   Pulse 74   Temp (!) 97.4 F (36.3 C) (Temporal)   Resp 16   Ht 5' 11"  (1.803 m)   Wt 170 lb (77.1 kg)   SpO2 97%   BMI 23.71 kg/m  BMI: Estimated body mass index is 23.71 kg/m as calculated from the following:   Height as of this encounter: 5' 11"  (1.803 m).   Weight as of this encounter: 170 lb (77.1 kg). Ideal: Ideal body weight: 75.3 kg (166 lb 0.1 oz) Adjusted ideal body weight: 76 kg (167 lb 9.7 oz)    Lumbar Exam  Skin & Axial Inspection:Well healed scar from previous spine surgery detected Alignment:Symmetrical Functional VHQ:[id number] ROM Stability:No instability detected Muscle Tone/Strength:Functionally intact. No obvious neuro-muscular anomalies detected. Sensory (Neurological):Dermatomal pain pattern  Gait & Posture Assessment  Ambulation:Limited Gait:Limited. Using assistive device to ambulate Posture:Difficulty standing up straight, due to  pain  Lower Extremity Exam    Side:Right lower extremity  Side:Left lower extremity  Stability:No instability observed  Stability:No instability observed  Skin & Extremity Inspection:Skin color, temperature, and hair growth are WNL. No peripheral edema or cyanosis. No masses, redness, swelling, asymmetry, or associated skin lesions. No contractures.  Skin & Extremity Inspection:Evidence of prior arthroplastic surgery  Functional XLK:GMWNU leg pain, right ankle pain   Functional UVO:ZDGU restricted ROM   Muscle Tone/Strength:Functionally intact. No obvious neuro-muscular anomalies detected.  Muscle Tone/Strength:Functionally intact. No obvious neuro-muscular anomalies detected.  Sensory (Neurological):Musculoskeletal, acute from fall  Sensory (Neurological):Arthropathic arthralgia  DTR: Patellar:deferred today Achilles:deferred today Plantar:deferred today  DTR: Patellar:deferred today Achilles:deferred today Plantar:deferred today  Palpation:No palpable anomalies  Palpation:No palpable anomalies   Assessment   Status Diagnosis  Persistent Persistent Persistent 1. Lumbosacral radiculopathy   2. Primary osteoarthritis of left ankle   3. 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 Chronic, continuous use of opioids   5. Sciatica of left side associated with disorder of lumbar spine   6. Tobacco abuse   7. Facet arthritis of lumbar region   8. Chronic left-sided low back pain with left-sided sciatica   9. Chronic pain syndrome       Plan of Care   [name] has a current medication list which includes the following long-term medication(s): bupropion, donepezil, fenofibrate, gabapentin, lisinopril-hydrochlorothiazide, memantine, metoprolol succinate, omeprazole, primidone, quetiapine, and rosuvastatin.  Pharmacotherapy (Medications  Ordered): Meds ordered this encounter  Medications  . HYDROcodone-acetaminophen (NORCO) 10-325 MG tablet    Sig: Take 1 tablet by mouth every 4 (four) hours as needed for severe pain. Must last 30 days.    Dispense:  180 tablet    Refill:  0    Chronic Pain. (STOP Act - Not applicable). Fill one day early if closed on scheduled refill date.  Marland Kitchen HYDROcodone-acetaminophen (NORCO) 10-325 MG tablet    Sig: Take 1 tablet by mouth every 4 (four) hours as needed for severe pain. Must last 30 days.    Dispense:  180 tablet    Refill:  0    Chronic Pain. (STOP Act - Not applicable). Fill one day early if closed on scheduled refill date.   Follow-up plan:   Return in about 8 weeks (around 07/21/2020) for Medication  Management, in person.   Recent Visits Date Type Provider Dept  03/01/20 Office Visit [name], MD [facility]  Showing recent visits within past 90 days and meeting all other requirements Today's Visits Date Type Provider Dept  05/26/20 Office Visit [name], MD [facility]  Showing today's visits and meeting all other requirements Future Appointments No visits were found meeting these conditions. Showing future appointments within next 90 days and meeting all other requirements  I discussed the assessment and treatment plan with the patient. The patient was provided an opportunity to ask questions and all were answered. The patient agreed with the plan and demonstrated an understanding of the instructions.  Patient advised to call back or seek an in-person evaluation if the symptoms or condition worsens.  Duration of encounter: 18mnutes.  Note by: BGillis Santa MD Date: 05/26/2020; Time: 11:11 AM

## 2020-05-30 ENCOUNTER — Other Ambulatory Visit: Payer: Self-pay

## 2020-05-30 ENCOUNTER — Encounter: Payer: Self-pay | Admitting: Emergency Medicine

## 2020-05-30 ENCOUNTER — Emergency Department: Payer: PPO

## 2020-05-30 ENCOUNTER — Inpatient Hospital Stay
Admission: EM | Admit: 2020-05-30 | Discharge: 2020-06-01 | DRG: 871 | Disposition: A | Discharge status: Home / Self Care | Payer: PPO | Attending: Internal Medicine | Admitting: Internal Medicine

## 2020-05-30 ENCOUNTER — Telehealth: Payer: Self-pay

## 2020-05-30 DIAGNOSIS — F32A Depression, unspecified: Secondary | ICD-10-CM | POA: Diagnosis not present

## 2020-05-30 DIAGNOSIS — Z7982 Long term (current) use of aspirin: Secondary | ICD-10-CM

## 2020-05-30 DIAGNOSIS — A419 Sepsis, unspecified organism: Secondary | ICD-10-CM | POA: Diagnosis not present

## 2020-05-30 DIAGNOSIS — G894 Chronic pain syndrome: Secondary | ICD-10-CM | POA: Diagnosis present

## 2020-05-30 DIAGNOSIS — I10 Essential (primary) hypertension: Secondary | ICD-10-CM | POA: Diagnosis not present

## 2020-05-30 DIAGNOSIS — E7800 Pure hypercholesterolemia, unspecified: Secondary | ICD-10-CM | POA: Diagnosis present

## 2020-05-30 DIAGNOSIS — Z8711 Personal history of peptic ulcer disease: Secondary | ICD-10-CM

## 2020-05-30 DIAGNOSIS — K279 Peptic ulcer, site unspecified, unspecified as acute or chronic, without hemorrhage or perforation: Secondary | ICD-10-CM | POA: Diagnosis present

## 2020-05-30 DIAGNOSIS — E119 Type 2 diabetes mellitus without complications: Secondary | ICD-10-CM | POA: Diagnosis present

## 2020-05-30 DIAGNOSIS — E785 Hyperlipidemia, unspecified: Secondary | ICD-10-CM | POA: Diagnosis not present

## 2020-05-30 DIAGNOSIS — Z8249 Family history of ischemic heart disease and other diseases of the circulatory system: Secondary | ICD-10-CM

## 2020-05-30 DIAGNOSIS — Z20822 Contact with and (suspected) exposure to covid-19: Secondary | ICD-10-CM | POA: Diagnosis present

## 2020-05-30 DIAGNOSIS — E0520 Thyrotoxicosis with toxic multinodular goiter without thyrotoxic crisis or storm: Secondary | ICD-10-CM | POA: Diagnosis not present

## 2020-05-30 DIAGNOSIS — K219 Gastro-esophageal reflux disease without esophagitis: Secondary | ICD-10-CM | POA: Diagnosis not present

## 2020-05-30 DIAGNOSIS — F17210 Nicotine dependence, cigarettes, uncomplicated: Secondary | ICD-10-CM | POA: Diagnosis present

## 2020-05-30 DIAGNOSIS — Z981 Arthrodesis status: Secondary | ICD-10-CM

## 2020-05-30 DIAGNOSIS — R0602 Shortness of breath: Secondary | ICD-10-CM | POA: Diagnosis not present

## 2020-05-30 DIAGNOSIS — R5381 Other malaise: Secondary | ICD-10-CM | POA: Diagnosis present

## 2020-05-30 DIAGNOSIS — F1021 Alcohol dependence, in remission: Secondary | ICD-10-CM | POA: Diagnosis present

## 2020-05-30 DIAGNOSIS — F419 Anxiety disorder, unspecified: Secondary | ICD-10-CM | POA: Diagnosis present

## 2020-05-30 DIAGNOSIS — G9341 Metabolic encephalopathy: Secondary | ICD-10-CM | POA: Diagnosis not present

## 2020-05-30 DIAGNOSIS — Z8719 Personal history of other diseases of the digestive system: Secondary | ICD-10-CM | POA: Diagnosis not present

## 2020-05-30 DIAGNOSIS — Z79899 Other long term (current) drug therapy: Secondary | ICD-10-CM | POA: Diagnosis not present

## 2020-05-30 DIAGNOSIS — N400 Enlarged prostate without lower urinary tract symptoms: Secondary | ICD-10-CM | POA: Diagnosis present

## 2020-05-30 DIAGNOSIS — R21 Rash and other nonspecific skin eruption: Secondary | ICD-10-CM | POA: Diagnosis not present

## 2020-05-30 DIAGNOSIS — Z96651 Presence of right artificial knee joint: Secondary | ICD-10-CM | POA: Diagnosis present

## 2020-05-30 DIAGNOSIS — E876 Hypokalemia: Secondary | ICD-10-CM | POA: Diagnosis present

## 2020-05-30 DIAGNOSIS — J189 Pneumonia, unspecified organism: Secondary | ICD-10-CM | POA: Diagnosis present

## 2020-05-30 DIAGNOSIS — F0390 Unspecified dementia without behavioral disturbance: Secondary | ICD-10-CM | POA: Diagnosis not present

## 2020-05-30 DIAGNOSIS — R4182 Altered mental status, unspecified: Secondary | ICD-10-CM

## 2020-05-30 DIAGNOSIS — E559 Vitamin D deficiency, unspecified: Secondary | ICD-10-CM | POA: Diagnosis present

## 2020-05-30 DIAGNOSIS — E042 Nontoxic multinodular goiter: Secondary | ICD-10-CM | POA: Diagnosis not present

## 2020-05-30 DIAGNOSIS — R6520 Severe sepsis without septic shock: Secondary | ICD-10-CM | POA: Diagnosis present

## 2020-05-30 DIAGNOSIS — R059 Cough, unspecified: Secondary | ICD-10-CM | POA: Diagnosis not present

## 2020-05-30 DIAGNOSIS — E041 Nontoxic single thyroid nodule: Secondary | ICD-10-CM | POA: Diagnosis not present

## 2020-05-30 DIAGNOSIS — J9811 Atelectasis: Secondary | ICD-10-CM | POA: Diagnosis not present

## 2020-05-30 LAB — CBC WITH DIFFERENTIAL/PLATELET
Abs Immature Granulocytes: 0.04 10*3/uL (ref 0.00–0.07)
Basophils Absolute: 0 10*3/uL (ref 0.0–0.1)
Basophils Relative: 0 %
Eosinophils Absolute: 0 10*3/uL (ref 0.0–0.5)
Eosinophils Relative: 0 %
HCT: 37.7 % — ABNORMAL LOW (ref 39.0–52.0)
Hemoglobin: 12.4 g/dL — ABNORMAL LOW (ref 13.0–17.0)
Immature Granulocytes: 0 %
Lymphocytes Relative: 6 %
Lymphs Abs: 0.7 10*3/uL (ref 0.7–4.0)
MCH: 31.4 pg (ref 26.0–34.0)
MCHC: 32.9 g/dL (ref 30.0–36.0)
MCV: 95.4 fL (ref 80.0–100.0)
Monocytes Absolute: 0.8 10*3/uL (ref 0.1–1.0)
Monocytes Relative: 6 %
Neutro Abs: 10.9 10*3/uL — ABNORMAL HIGH (ref 1.7–7.7)
Neutrophils Relative %: 88 %
Platelets: 242 10*3/uL (ref 150–400)
RBC: 3.95 MIL/uL — ABNORMAL LOW (ref 4.22–5.81)
RDW: 13.8 % (ref 11.5–15.5)
WBC: 12.5 10*3/uL — ABNORMAL HIGH (ref 4.0–10.5)
nRBC: 0 % (ref 0.0–0.2)

## 2020-05-30 LAB — PROTIME-INR
INR: 1 (ref 0.8–1.2)
Prothrombin Time: 12.8 seconds (ref 11.4–15.2)

## 2020-05-30 LAB — URINALYSIS, COMPLETE (UACMP) WITH MICROSCOPIC
Bacteria, UA: NONE SEEN
Bilirubin Urine: NEGATIVE
Glucose, UA: >=500 mg/dL — AB
Hgb urine dipstick: NEGATIVE
Ketones, ur: 5 mg/dL — AB
Leukocytes,Ua: NEGATIVE
Nitrite: NEGATIVE
Protein, ur: NEGATIVE mg/dL
Specific Gravity, Urine: 1.023 (ref 1.005–1.030)
pH: 5 (ref 5.0–8.0)

## 2020-05-30 LAB — COMPREHENSIVE METABOLIC PANEL
ALT: 19 U/L (ref 0–44)
AST: 26 U/L (ref 15–41)
Albumin: 3.6 g/dL (ref 3.5–5.0)
Alkaline Phosphatase: 35 U/L — ABNORMAL LOW (ref 38–126)
Anion gap: 15 (ref 5–15)
BUN: 31 mg/dL — ABNORMAL HIGH (ref 8–23)
CO2: 24 mmol/L (ref 22–32)
Calcium: 9.1 mg/dL (ref 8.9–10.3)
Chloride: 101 mmol/L (ref 98–111)
Creatinine, Ser: 1.09 mg/dL (ref 0.61–1.24)
GFR, Estimated: >60 mL/min (ref >60)
Glucose, Bld: 179 mg/dL — ABNORMAL HIGH (ref 70–99)
Potassium: 3.6 mmol/L (ref 3.5–5.1)
Sodium: 140 mmol/L (ref 135–145)
Total Bilirubin: 1.2 mg/dL (ref 0.3–1.2)
Total Protein: 7 g/dL (ref 6.5–8.1)

## 2020-05-30 LAB — LACTIC ACID, PLASMA
Lactic Acid, Venous: 1.1 mmol/L (ref 0.5–1.9)
Lactic Acid, Venous: 0.9 mmol/L (ref 0.5–1.9)

## 2020-05-30 LAB — RESP PANEL BY RT-PCR (FLU A&B, COVID) ARPGX2
Influenza A by PCR: NEGATIVE
Influenza B by PCR: NEGATIVE
SARS Coronavirus 2 by RT PCR: NEGATIVE

## 2020-05-30 LAB — POC SARS CORONAVIRUS 2 AG -  ED: SARS Coronavirus 2 Ag: NEGATIVE

## 2020-05-30 LAB — PROCALCITONIN: Procalcitonin: 0.57 ng/mL

## 2020-05-30 MED ORDER — IOHEXOL 350 MG/ML SOLN
75.0000 mL | Freq: Once | INTRAVENOUS | Status: AC | PRN
  Administered 2020-05-30: 75 mL via INTRAVENOUS

## 2020-05-30 MED ORDER — VANCOMYCIN HCL IN DEXTROSE 1-5 GM/200ML-% IV SOLN
1000.0000 mg | Freq: Once | INTRAVENOUS | Status: AC
  Administered 2020-05-30: 1000 mg via INTRAVENOUS
  Filled 2020-05-30: qty 200

## 2020-05-30 MED ORDER — SODIUM CHLORIDE 0.9 % IV BOLUS
1000.0000 mL | Freq: Once | INTRAVENOUS | Status: AC
  Administered 2020-05-30: 1000 mL via INTRAVENOUS

## 2020-05-30 MED ORDER — SODIUM CHLORIDE 0.9 % IV SOLN
500.0000 mg | INTRAVENOUS | Status: DC
  Administered 2020-05-31 – 2020-06-01 (×2): 500 mg via INTRAVENOUS
  Filled 2020-05-30 (×3): qty 500

## 2020-05-30 MED ORDER — SODIUM CHLORIDE 0.9 % IV SOLN: INTRAVENOUS | Status: AC

## 2020-05-30 MED ORDER — SODIUM CHLORIDE 0.9 % IV SOLN
2.0000 g | INTRAVENOUS | Status: DC
  Administered 2020-05-31 – 2020-06-01 (×2): 2 g via INTRAVENOUS
  Filled 2020-05-30 (×2): qty 20
  Filled 2020-05-30: qty 2

## 2020-05-30 MED ORDER — SODIUM CHLORIDE 0.9 % IV SOLN
2.0000 g | Freq: Once | INTRAVENOUS | Status: AC
  Administered 2020-05-30: 2 g via INTRAVENOUS
  Filled 2020-05-30: qty 2

## 2020-05-30 MED ORDER — ACETAMINOPHEN 325 MG PO TABS
650.0000 mg | ORAL_TABLET | Freq: Once | ORAL | Status: AC
  Administered 2020-05-30: 650 mg via ORAL
  Filled 2020-05-30: qty 2

## 2020-05-30 MED ORDER — ENOXAPARIN SODIUM 40 MG/0.4ML ~~LOC~~ SOLN
40.0000 mg | SUBCUTANEOUS | Status: DC
  Administered 2020-05-31 – 2020-06-01 (×2): 40 mg via SUBCUTANEOUS
  Filled 2020-05-30 (×2): qty 0.4

## 2020-05-30 NOTE — ED Triage Notes (Signed)
First Nurse Note:  Arrives with wife.  C/O cough x 1 day

## 2020-05-30 NOTE — ED Notes (Addendum)
Wife at bedside. Wife states pt had a fever of 137f at home and that is why they brought pt in, along with pt "being more out of it." Pt is on cardiac, bp and pulse ox monitor.

## 2020-05-30 NOTE — ED Notes (Signed)
Pt changed as he urinated in depends.

## 2020-05-30 NOTE — ED Provider Notes (Signed)
[facility] Emergency Department Provider Note   ____________________________________________   I have reviewed the triage vital signs and the nursing notes.   HISTORY  Chief Complaint Cough, Altered Mental Status, and Fever   History limited by: Not limited, some history obtained from family  HPI James Hood is a 80 y.o. male who presents to the emergency department today because of concern for cough and altered mental status. The patient has been feeling somewhat weaker over the past couple of days. Today family felt like he was more confused then he normally is. When they felt him he felt hot and was found to have a fever. The patient has had a cough as well. It has been a dry and raspy cough. He did complain of some chest tightness with the cough. The patient has not had any known sick contacts.    Records reviewed. Per medical record review patient has a history of dementia, HTN, HLD.   Past Medical History:  Diagnosis Date  . Anxiety   . Anxiety and depression   . Arthritis   . BPH (benign prostatic hyperplasia)   . 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 Colon polyps   . Dementia ([place])   . Depression   . Diabetes mellitus type 2 in nonobese (HCC)   . GERD (gastroesophageal reflux disease)    BARRETTS ESOPHAGUS RESOLVED PER PATIENT  . Headache   . 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 History of hiatal hernia   . Hypercholesteremia   . Hypertension   . Hyperthyroidism   . Neuropathic pain   . Primary localized osteoarthritis of right knee   . 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 Stomach ulcer   . Tremor, essential   . Wound infection complicating hardware ([place]) 04/02/2018    Patient Active Problem List   Diagnosis Date Noted  . 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 Right leg pain 02/05/2020  . Impingement of left ankle joint   . Sleeping difficulty 08/24/2019  . Hiccups 08/24/2019  . Chronic pain of  left ankle 02/27/2019  . Vitamin D deficiency 02/18/2019  . Left shoulder pain 02/02/2019  . Neuropathy 09/24/2018  . Lumbosacral radiculopathy 01/14/2018  . 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 Facet arthritis of lumbar region 01/14/2018  . Chronic pain syndrome 08/26/2017  . 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 Thyroid nodule 02/27/2017  . Fatty liver 02/27/2017  . Purpura ([place]) 01/29/2017  . Right hip pain 08/16/2016  . Lumbar herniated disc 02/03/2016  . Hypertriglyceridemia 12/09/2015  . Anemia 12/09/2015  . Falls 09/16/2015  . Barrett's esophagus 09/16/2015  . Chronic lumbar pain 09/16/2015  . Benign prostatic hyperplasia 09/16/2015  . Trochanteric bursitis of left hip 09/09/2015  . Headache 09/09/2015  . Difficulty in walking 06/01/2015  . Amnesia 06/01/2015  . HLD (hyperlipidemia) 02/23/2015  . DJD (degenerative joint disease) of knee 11/15/2014  . Hypertension   . Tremor, essential   . Primary localized osteoarthritis of right knee   . Anxiety and depression   . Absence of sensation 11/18/2013  . Tobacco abuse 11/18/2013  . Chronic diarrhea 11/12/2013  . Benign neoplasm of colon 08/25/2013  . 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 Type 2 diabetes mellitus ([place]) 10/06/2012    Past Surgical History:  Procedure Laterality Date  . ANKLE ARTHROSCOPY Left 09/29/2019   Procedure: LEFT ANKLE ARTHROSCOPY, DEBRIDEMENT;  Surgeon: Newt Minion, MD;  Location: [facility];  Service: Orthopedics;  Laterality: Left;  . BACK SURGERY    . COLONOSCOPY WITH PROPOFOL N/A 09/14/2016   Procedure: COLONOSCOPY WITH PROPOFOL;  Surgeon: Manya Silvas, MD;  Location: [facility] ENDOSCOPY;  Service: Endoscopy;  Laterality: N/A;  . esophageal stretch    . JOINT REPLACEMENT Right 2016   knee  . LUMBAR LAMINECTOMY/DECOMPRESSION MICRODISCECTOMY Left 02/03/2016   Procedure: LEFT L5-S1 DISKECTOMY;  Surgeon: Leeroy Cha, MD;  Location: [facility]S;  Service:  Neurosurgery;  Laterality: Left;  LEFT L5-S1 DISKECTOMY  . PULSE GENERATOR IMPLANT N/A 08/21/2017   Procedure: UNILATERAL PULSE GENERATOR IMPLANT;  Surgeon: Meade Maw, MD;  Location: [facility] ORS;  Service: Neurosurgery;  Laterality: N/A;  . PULSE GENERATOR IMPLANT Right 04/02/2018   Procedure: REMOVAL OF PULSE GENERATOR IMPLANT AND LEADS;  Surgeon: Meade Maw, MD;  Location: [facility] ORS;  Service: Neurosurgery;  Laterality: Right;  . TONSILLECTOMY    . TOTAL KNEE ARTHROPLASTY Right 11/15/2014   Procedure: TOTAL KNEE ARTHROPLASTY;  Surgeon: Elsie Saas, MD;  Location: [facility];  Service: Orthopedics;  Laterality: Right;    Prior to Admission medications   Medication Sig Start Date End Date Taking? Authorizing Provider  aspirin EC 81 MG tablet Take 81 mg by mouth daily.    [provider]  budesonide (ENTOCORT EC) 3 MG 24 hr capsule Take 9 mg by mouth daily. 08/06/19   [provider]  buPROPion (WELLBUTRIN SR) 150 MG 12 hr tablet TAKE ONE TABLET BY MOUTH TWICE A DAY 04/28/20   Leone Haven, MD  Continuous Blood Gluc Receiver (FREESTYLE LIBRE 14 DAY READER) DEVI USE DAILY AS DIRECED TO CHECK BLOOD GLUCOSE 12/28/19   Leone Haven, MD  Continuous Blood Gluc Sensor (FREESTYLE LIBRE 14 DAY SENSOR) MISC APPLY ONE SENSOR EVERY 14 DAYS TO MONITOR BLOOD GLUCOSE LEVELS.  REMOVE OLD SENSOR BEFORE APPLYING NEW SENSOR 05/09/20   Leone Haven, MD  diphenoxylate-atropine (LOMOTIL) 2.5-0.025 MG tablet Take 1 tablet by mouth 2 (two) times daily.  11/12/16   [provider]  divalproex (DEPAKOTE ER) 500 MG 24 hr tablet Take 1 tablet by mouth 2 (two) times daily. 03/20/18   [provider]  donepezil (ARICEPT) 10 MG tablet Take 10 mg by mouth at bedtime.    [provider]  fenofibrate (TRICOR) 145 MG tablet TAKE ONE TABLET BY MOUTH DAILY 12/21/19   Leone Haven, MD  finasteride (PROSCAR) 5 MG tablet TAKE ONE TABLET BY MOUTH DAILY 04/21/20    Leone Haven, MD  gabapentin (NEURONTIN) 600 MG tablet TAKE TWO TABLETS BY MOUTH THREE TIMES A DAY 11/02/19   Leone Haven, MD  HYDROcodone-acetaminophen (NORCO) 10-325 MG tablet Take 1 tablet by mouth every 4 (four) hours as needed for severe pain. Must last 30 days. 05/30/20 06/29/20  [name], MD  HYDROcodone-acetaminophen (NORCO) 10-325 MG tablet Take 1 tablet by mouth every 4 (four) hours as needed for severe pain. Must last 30 days. 06/29/20 07/29/20  [name], MD  JARDIANCE 25 MG TABS tablet TAKE ONE TABLET BY MOUTH DAILY 05/26/20   Leone Haven, MD  lisinopril-hydrochlorothiazide (ZESTORETIC) 20-25 MG tablet TAKE ONE TABLET BY MOUTH TWICE A DAY 01/04/20   Leone Haven, MD  memantine (NAMENDA) 10 MG tablet TAKE ONE TABLET BY MOUTH TWICE A DAY 06/30/19   [provider]  metoprolol succinate (TOPROL-XL) 25 MG 24 hr tablet Take 25 mg by mouth daily. 01/15/20   [provider]  Multiple Vitamins-Minerals (CENTRUM SILVER PO)  Take 1 tablet by mouth daily.    [provider]  omeprazole (PRILOSEC) 20 MG capsule Take 20 mg by mouth 2 (two) times daily before a meal.  04/01/16   [provider]  primidone (MYSOLINE) 50 MG tablet Take 150-250 mg by mouth 2 (two) times daily. Take 250 mg by mouth in the morning & take 150 mg at night.    [provider]  QUEtiapine (SEROQUEL) 25 MG tablet Take 125 mg by mouth at bedtime. Can take an additional 25 mg if he wakes up overnight    [provider]  rosuvastatin (CRESTOR) 40 MG tablet TAKE ONE TABLET BY MOUTH DAILY 04/25/20   Leone Haven, MD  Semaglutide,0.25 or 0.5MG/DOS, (OZEMPIC, 0.25 OR 0.5 MG/DOSE,) 2 MG/1.5ML SOPN Inject 0.5 mg into the skin once a week. 04/05/20   Leone Haven, MD  tamsulosin (FLOMAX) 0.4 MG CAPS capsule TAKE ONE CAPSULE BY MOUTH DAILY AFTER SUPPER 04/18/20   Leone Haven, MD    Allergies Patient has no known allergies.  Family History   Problem Relation Age of Onset  . Heart attack Mother   . Heart disease Mother   . Hypertension Father   . Depression Father   . Kidney cancer Neg Hx   . Prostate cancer Neg Hx     Social History Social History   Tobacco Use  . Smoking status: Current Every Day Smoker    Packs/day: 0.50    Years: 60.00    Pack years: 30.00    Types: Cigarettes  . Smokeless tobacco: Never Used  Vaping Use  . Vaping Use: Some days  Substance Use Topics  . 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 Drug use: Never    Review of Systems Constitutional: Positive for fever. Eyes: No visual changes. ENT: No sore throat. Cardiovascular: Positive for chest tightness. Respiratory: Positive for cough. Gastrointestinal: No abdominal pain.  No nausea, no vomiting.  No diarrhea.   Genitourinary: Negative for dysuria. Musculoskeletal: Negative for back pain. Skin: Negative for rash. Neurological: Positive for altered mental status.  ____________________________________________   PHYSICAL EXAM:  VITAL SIGNS: ED Triage Vitals  Enc Vitals Group     BP 05/30/20 1644 123/74     Pulse Rate 05/30/20 1644 (!) 128     Resp 05/30/20 1644 20     Temp 05/30/20 1644 (!) 102.4 F (39.1 C)     Temp Source 05/30/20 1644 Oral     SpO2 05/30/20 1644 90 %     Weight 05/30/20 1645 170 lb (77.1 kg)     Height 05/30/20 1645 5' 11"  (1.803 m)     Head Circumference --      Peak Flow --      Pain Score 05/30/20 1645 0   Constitutional: Alert and oriented.  Eyes: Conjunctivae are normal.  ENT      Head: Normocephalic and atraumatic.      Nose: No congestion/rhinnorhea.      Mouth/Throat: Mucous membranes are moist.      Neck: No stridor. Hematological/Lymphatic/Immunilogical: No cervical lymphadenopathy. Cardiovascular: Tachycardic, regular rhythm.  No murmurs, rubs, or gallops.  Respiratory: Normal respiratory effort without tachypnea  nor retractions. Breath sounds are clear and equal bilaterally. No wheezes/rales/rhonchi. Gastrointestinal: Soft and non tender. No rebound. No guarding.  Genitourinary: Deferred Musculoskeletal: Normal range of motion in all extremities. No lower extremity edema. Neurologic:  Normal speech  and language. No gross focal neurologic deficits are appreciated.  Skin:  Skin is warm, dry and intact. No rash noted. Psychiatric: Mood and affect are normal. Speech and behavior are normal. Patient exhibits appropriate insight and judgment.  ____________________________________________    LABS (pertinent positives/negatives)  Lactic acid 1.1 CMP wnl except glu 179, bun 31, alk phos 35 CBC wbc 12.5, hgb 12.4, plt 242 COVID negative UA clear, ketones 5, rbc and wbc 0-5 ____________________________________________   EKG  I, Nance Pear, attending physician, personally viewed and interpreted this EKG  EKG Time: 1646 Rate: 126 Rhythm: sinus tachycardia with PVC Axis: right axis deviation Intervals: qtc 550 QRS: incomplete RBBB ST changes: no st elevation Impression: abnormal ekg  ____________________________________________    RADIOLOGY  CXR No active disease  CT angio PE No pulmonary embolism. Bibasilar airspace opacities.  ____________________________________________   PROCEDURES  Procedures  ____________________________________________   INITIAL IMPRESSION / ASSESSMENT AND PLAN / ED COURSE  Pertinent labs & imaging results that were available during my care of the patient were reviewed by me and considered in my medical decision making (see chart for details).   Patient presented to the emergency department today because of concerns for fever and some confusion.  Patient was complaining of some shortness of breath and cough.  Chest x-ray without any pneumonia.  Blood work without any leukocytosis or elevated lactic acidosis.  However procalcitonin was elevated.  I did  obtain a CT angio given shortness of breath tachycardia.  No PEs but this was concerning for possible infection.  Patient was started on IV antibiotics and will plan on admission.  ____________________________________________   FINAL CLINICAL IMPRESSION(S) / ED DIAGNOSES  Final diagnoses:  Pneumonia due to infectious organism, unspecified laterality, unspecified part of lung  Shortness of breath     Note: This dictation was prepared with Dragon dictation. Any transcriptional errors that result from this process are unintentional     Nance Pear, MD 05/30/20 2147

## 2020-05-30 NOTE — H&P (Signed)
History and Physical    OWYN GARDE [id number] DOB: 21-Jan-1941 DOA: 05/30/2020  PCP: Leone Haven, MD  Patient coming from: home  I have personally briefly reviewed patient's old medical records in [place]  Chief Complaint: fever and confusion with cough x 1 day HPI: James Hood is a 80 y.o. 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 Per chart patient beginning night prior to presentation and increase congestion. However, he was noted to be well enough in am to go out to breakfast. Per wife after breakfast he spiked a fever and become confused. Due to this progression family contacted pcp who referred patient to ED.  In ed patient noted to be somewhat confused. Initial work was negative, however per ED MD procal was noted to 0.5, ct thorax ordered which noted infiltrate. Of note respiratory swab was negative. Patient currently states he feels improved and more like himself. He notes he has had dry cough for weeks but over the last 1-2 days he has noted sob and increase chest congestion.  He also endorses fever, chills and significant malaise and generalized weakness. He however on further review of systems he denies chest pain , diarrhea or dysuria.   n/v/d or abdominal pain. He notes he his fully vaccinated and has received his booster. He notes no sick contacts.   ED Course:  Temp 102.4, bp 123/74, hr 128  rr 20 [id number] with pvc with ? RVH Labs:  Wbc 12.5, hgb 12.4, mcv 95.4 NA 140, 3.6, CL 101, glu 179 cr 1 at  baseline Lactic acid 1.1 UA: neg procalcitonin 0.57  CTPA IMPRESSION: No evidence of pulmonary embolus.  Coronary artery disease.  Bibasilar airspace opacities, atelectasis versus developing infiltrate/pneumonia.  Old granulomatous disease. Tx: tylenol, 2L ns,  Review of Systems: As per HPI otherwise 10 point review of systems negative.   Past Medical History:  Diagnosis Date  . Anxiety   . Anxiety and depression   . Arthritis   . BPH (benign prostatic hyperplasia)   . 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 Colon polyps   . Dementia ([place])   . Depression   . Diabetes mellitus type 2 in nonobese (HCC)   . GERD (gastroesophageal reflux disease)    BARRETTS ESOPHAGUS RESOLVED PER PATIENT  . Headache   . 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[place])   . History of hiatal hernia   . Hypercholesteremia   . Hypertension   . Hyperthyroidism   . Neuropathic pain   . Primary localized osteoarthritis of right knee   . 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 Stomach ulcer   . Tremor, essential   . Wound infection complicating hardware ([place]) 04/02/2018    Past Surgical History:  Procedure Laterality Date  . ANKLE ARTHROSCOPY Left 09/29/2019   Procedure: LEFT ANKLE ARTHROSCOPY, DEBRIDEMENT;  Surgeon: Newt Minion, MD;  Location: [facility];  Service: Orthopedics;  Laterality: Left;  . BACK SURGERY    . COLONOSCOPY WITH PROPOFOL N/A 09/14/2016   Procedure: COLONOSCOPY WITH PROPOFOL;  Surgeon: Manya Silvas, MD;  Location:  [facility];  Service: Endoscopy;  Laterality: N/A;  . esophageal stretch    . JOINT REPLACEMENT Right 2016   knee  . LUMBAR LAMINECTOMY/DECOMPRESSION MICRODISCECTOMY Left 02/03/2016   Procedure: LEFT L5-S1 DISKECTOMY;  Surgeon: Leeroy Cha, MD;  Location: [facility];  Service: Neurosurgery;  Laterality: Left;  LEFT L5-S1 DISKECTOMY  . PULSE GENERATOR IMPLANT N/A 08/21/2017   Procedure: UNILATERAL PULSE  GENERATOR IMPLANT;  Surgeon: Meade Maw, MD;  Location: [facility];  Service: Neurosurgery;  Laterality: N/A;  . PULSE GENERATOR IMPLANT Right 04/02/2018   Procedure: REMOVAL OF PULSE GENERATOR IMPLANT AND LEADS;  Surgeon: Meade Maw, MD;  Location: [facility];  Service: Neurosurgery;  Laterality: Right;  . TONSILLECTOMY    . TOTAL KNEE ARTHROPLASTY Right 11/15/2014   Procedure: TOTAL KNEE ARTHROPLASTY;  Surgeon: Elsie Saas, MD;  Location: [facility];  Service: Orthopedics;  Laterality: Right;     reports that he has been smoking cigarettes. He has a 30.00 pack-year smoking history. He has never used smokeless tobacco. He reports previous alcohol use. He reports that he does not use drugs.  No Known Allergies  Family History  Problem Relation Age of Onset  . Heart attack Mother   . Heart disease Mother   . Hypertension Father   . Depression Father   . Kidney cancer Neg Hx   . Prostate cancer Neg Hx     Prior to Admission medications   Medication Sig Start Date End Date Taking? Authorizing Provider  aspirin EC 81 MG tablet Take 81 mg by mouth daily.    [provider]  budesonide (ENTOCORT EC) 3 MG 24 hr capsule Take 9 mg by mouth daily. 08/06/19   [provider]  buPROPion (WELLBUTRIN SR) 150 MG 12 hr tablet TAKE ONE TABLET BY MOUTH TWICE A DAY 04/28/20   Leone Haven, MD  Continuous Blood Gluc Receiver (FREESTYLE LIBRE 14 DAY READER) DEVI USE DAILY AS DIRECED TO CHECK BLOOD GLUCOSE 12/28/19   Leone Haven, MD  Continuous Blood Gluc Sensor (FREESTYLE LIBRE 14 DAY SENSOR) MISC APPLY ONE SENSOR EVERY 14 DAYS TO MONITOR BLOOD GLUCOSE LEVELS.  REMOVE OLD SENSOR BEFORE APPLYING NEW SENSOR 05/09/20   Leone Haven, MD  diphenoxylate-atropine (LOMOTIL) 2.5-0.025 MG tablet Take 1 tablet by mouth 2 (two) times daily.  11/12/16   [provider]  divalproex (DEPAKOTE ER) 500 MG 24 hr tablet Take 1 tablet by mouth 2 (two) times daily. 03/20/18    [provider]  donepezil (ARICEPT) 10 MG tablet Take 10 mg by mouth at bedtime.    [provider]  fenofibrate (TRICOR) 145 MG tablet TAKE ONE TABLET BY MOUTH DAILY 12/21/19   Leone Haven, MD  finasteride (PROSCAR) 5 MG tablet TAKE ONE TABLET BY MOUTH DAILY 04/21/20   Leone Haven, MD  gabapentin (NEURONTIN) 600 MG tablet TAKE TWO TABLETS BY MOUTH THREE TIMES A DAY 11/02/19   Leone Haven, MD  HYDROcodone-acetaminophen (NORCO) 10-325 MG tablet Take 1 tablet by mouth every 4 (four) hours as needed for severe pain. Must last 30 days. 05/30/20 06/29/20  [name], MD  HYDROcodone-acetaminophen (NORCO) 10-325 MG tablet Take 1 tablet by mouth every 4 (four) hours as needed for severe pain. Must last 30 days. 06/29/20 07/29/20  [name], MD  JARDIANCE 25 MG TABS tablet TAKE ONE TABLET BY MOUTH DAILY 05/26/20   Leone Haven, MD  lisinopril-hydrochlorothiazide (ZESTORETIC) 20-25 MG tablet TAKE ONE TABLET BY MOUTH TWICE A DAY  01/04/20   Leone Haven, MD  memantine (NAMENDA) 10 MG tablet TAKE ONE TABLET BY MOUTH TWICE A DAY 06/30/19   [provider]  metoprolol succinate (TOPROL-XL) 25 MG 24 hr tablet Take 25 mg by mouth daily. 01/15/20   [provider]  Multiple Vitamins-Minerals (CENTRUM SILVER PO) Take 1 tablet by mouth daily.    [provider]  omeprazole (PRILOSEC) 20 MG capsule Take 20 mg by mouth 2 (two) times daily before a meal.  04/01/16   [provider]  primidone (MYSOLINE) 50 MG tablet Take 150-250 mg by mouth 2 (two) times daily. Take 250 mg by mouth in the morning & take 150 mg at night.    [provider]  QUEtiapine (SEROQUEL) 25 MG tablet Take 125 mg by mouth at bedtime. Can take an additional 25 mg if he wakes up overnight    [provider]  rosuvastatin (CRESTOR) 40 MG tablet TAKE ONE TABLET BY MOUTH DAILY 04/25/20   Leone Haven, MD  Semaglutide,0.25 or 0.5MG /DOS, (OZEMPIC, 0.25 OR  0.5 MG/DOSE,) 2 MG/1.5ML SOPN Inject 0.5 mg into the skin once a week. 04/05/20   Leone Haven, MD  tamsulosin (FLOMAX) 0.4 MG CAPS capsule TAKE ONE CAPSULE BY MOUTH DAILY AFTER SUPPER 04/18/20   Leone Haven, MD    Physical Exam: Vitals:   05/30/20 1840 05/30/20 1900 05/30/20 2000 05/30/20 2030  BP: 126/73 121/70 113/65 (!) 111/58  Pulse: (!) 110  (!) 113 (!) 112  Resp: (!) 23 (!) 22 20 (!) 23  Temp: 98.7 F (37.1 C)     TempSrc:      SpO2: 97%  92% 91%  Weight:      Height:        Constitutional: NAD, calm, comfortable Vitals:   05/30/20 1840 05/30/20 1900 05/30/20 2000 05/30/20 2030  BP: 126/73 121/70 113/65 (!) 111/58  Pulse: (!) 110  (!) 113 (!) 112  Resp: (!) 23 (!) 22 20 (!) 23  Temp: 98.7 F (37.1 C)     TempSrc:      SpO2: 97%  92% 91%  Weight:      Height:       Eyes: PERRL, lids and conjunctivae normal ENMT: Mucous membranes are moist. Posterior pharynx clear of any exudate or lesions.Normal dentition.  Neck: normal, supple, no masses, no thyromegaly Respiratory: clear to auscultation bilaterally, no wheezing, no crackles. Normal respiratory effort. No accessory muscle use.  Cardiovascular: Regular rate and rhythm, no murmurs / rubs / gallops. No extremity edema. 2+ pedal pulses. No carotid bruits.  Abdomen: no tenderness, no masses palpated. No hepatosplenomegaly. Bowel sounds positive.  Musculoskeletal: no clubbing / cyanosis. No joint deformity upper and lower extremities. Good ROM, no contractures. Normal muscle tone.  Skin: no rashes, lesions, ulcers. No induration Neurologic: CN 2-12 grossly intact. Sensation intact, DTR normal. Strength 5/5 in all 4.  Psychiatric: Normal judgment and insight. Alert and oriented x 3. Normal mood.    Labs on Admission: I have personally reviewed following labs and imaging studies  CBC: Recent Labs  Lab 05/30/20 1718  WBC 12.5*  NEUTROABS 10.9*  HGB 12.4*  HCT 37.7*  MCV 95.4  PLT [id number]   Basic  Metabolic Panel: Recent Labs  Lab 05/30/20 1718  NA 140  K 3.6  CL 101  CO2 24  GLUCOSE 179*  BUN 31*  CREATININE 1.09  CALCIUM 9.1   GFR: Estimated Creatinine Clearance: 57.6 mL/min (by C-G formula based on  SCr of 1.09 mg/dL). Liver Function Tests: Recent Labs  Lab 05/30/20 1718  AST 26  ALT 19  ALKPHOS 35*  BILITOT 1.2  PROT 7.0  ALBUMIN 3.6   No results for input(s): LIPASE, AMYLASE in the last 168 hours. No results for input(s): AMMONIA in the last 168 hours. Coagulation Profile: Recent Labs  Lab 05/30/20 1718  INR 1.0   Cardiac Enzymes: No results for input(s): CKTOTAL, CKMB, CKMBINDEX, TROPONINI in the last 168 hours. BNP (last 3 results) No results for input(s): PROBNP in the last 8760 hours. HbA1C: No results for input(s): HGBA1C in the last 72 hours. CBG: No results for input(s): GLUCAP in the last 168 hours. Lipid Profile: No results for input(s): CHOL, HDL, LDLCALC, TRIG, CHOLHDL, LDLDIRECT in the last 72 hours. Thyroid Function Tests: No results for input(s): TSH, T4TOTAL, FREET4, T3FREE, THYROIDAB in the last 72 hours. Anemia Panel: No results for input(s): VITAMINB12, FOLATE, FERRITIN, TIBC, IRON, RETICCTPCT in the last 72 hours. Urine analysis:    Component Value Date/Time   COLORURINE YELLOW (A) 05/30/2020 1800   APPEARANCEUR CLEAR (A) 05/30/2020 1800   APPEARANCEUR Clear 10/07/2015 1121   LABSPEC 1.023 05/30/2020 1800   PHURINE 5.0 05/30/2020 1800   GLUCOSEU >=500 (A) 05/30/2020 1800   HGBUR NEGATIVE 05/30/2020 1800   BILIRUBINUR NEGATIVE 05/30/2020 1800   BILIRUBINUR neg 07/20/2019 1608   BILIRUBINUR Negative 10/07/2015 1121   KETONESUR 5 (A) 05/30/2020 1800   PROTEINUR NEGATIVE 05/30/2020 1800   UROBILINOGEN 0.2 07/20/2019 1608   NITRITE NEGATIVE 05/30/2020 1800   LEUKOCYTESUR NEGATIVE 05/30/2020 1800    Radiological Exams on Admission: CT Angio Chest PE W and/or Wo Contrast  Result Date: 05/30/2020 CLINICAL DATA:  Shortness of  breath EXAM: CT ANGIOGRAPHY CHEST WITH CONTRAST TECHNIQUE: Multidetector CT imaging of the chest was performed using the standard protocol during bolus administration of intravenous contrast. Multiplanar CT image reconstructions and MIPs were obtained to evaluate the vascular anatomy. CONTRAST:  64mL OMNIPAQUE IOHEXOL 350 MG/ML SOLN COMPARISON:  08/04/2019 FINDINGS: Cardiovascular: Heart is normal size. Coronary artery calcifications and scattered aortic calcifications. No aneurysm. No filling defects in the pulmonary arteries to suggest pulmonary emboli. Mediastinum/Nodes: No mediastinal, hilar, or axillary adenopathy. Few scattered mediastinal calcified lymph nodes. Trachea and esophagus are unremarkable. Small bilateral thyroid nodules are stable since prior study, measuring up to 1.5 cm. This has been previously biopsied and unchanged. No follow-up imaging recommended based on prior biopsy (ref: J Am Coll Radiol. 2015 Feb;12(2): 143-50). Lungs/Pleura: Bibasilar airspace opacities could reflect atelectasis although early infiltrate/pneumonia possible. No effusions. Upper Abdomen: Imaging into the upper abdomen demonstrates no acute findings. Musculoskeletal: Chest wall soft tissues are unremarkable. No acute bony abnormality. Review of the MIP images confirms the above findings. IMPRESSION: No evidence of pulmonary embolus. Coronary artery disease. Bibasilar airspace opacities, atelectasis versus developing infiltrate/pneumonia. Old granulomatous disease. Electronically Signed   By: Rolm Baptise M.D.   On: 05/30/2020 21:13   DG Chest Port 1 View  Result Date: 05/30/2020 CLINICAL DATA:  80 year old male with cough. EXAM: PORTABLE CHEST 1 VIEW COMPARISON:  Chest radiograph dated 08/14/2016 FINDINGS: Minimal left lung base atelectasis. No focal consolidation, pleural effusion, pneumothorax. Top-normal cardiac size. Atherosclerotic calcification of the aorta. No acute osseous pathology. IMPRESSION: No active  disease. Electronically Signed   By: Anner Crete M.D.   On: 05/30/2020 18:37    EKG: Independently reviewed. See above  Assessment/Plan Sepsis Due to CAP  -treat underlying cause  -s/p goal directed ivfs in  ed  - lactic acid currently within normal limits   CAP -place on cap protocol -ctx/azithromycin  -check urine AG  -nebs prn , pulmonary toilet  -monitor saturations, supplemental O2 prn   DMII -stable fs  -place on is/fs  -holding oral hypoglycemic currently  -will check a1c   Anxiety depression -resume home regimen  -wellbutrin  BPH -continue flomax and finasteride   CLBP with left-sided sciatica -S/P insertion of spinal cord stimulator (08/26/2017),   -supportive care  -resume home norco  Dementia -continue namenda     GERD/PUD -PPI    History of alcoholism -in remission   HLD -continue statin    HTN -continue metoprolol, ace/hctz   Hyperthyroidism -continue synthroid    DVT prophylaxis: Lovenox  Code Status: FULL Family Communication: not at beside  Disposition Plan: patient  expected to be admitted greater than 2 midnights Consults called: n/a  Admission status:inpatient   Clance Boll MD [facility]  If 7PM-7AM, please contact night-coverage [email or web address] Password [email or web address]  05/30/2020, 10:02 PM

## 2020-05-30 NOTE — ED Triage Notes (Signed)
Pt to ER with wife states last night pt c/o cough.  States this AM went out to breakfast and when he came home began to get confused and altered.  States temp at home 102.

## 2020-05-30 NOTE — Telephone Encounter (Signed)
Pt's daughter called and wanted pt to be seen. Pt is running a 102 degree fever and is not all "with it". She states that he was coughing all last night. I advised her that we had no appts avail until Wednesday. I advised that she take him to the UC or ED. She states that she would take him to the one across the street. She would like a call back to check on his status

## 2020-05-30 NOTE — ED Notes (Addendum)
Pt got up without assistance to bathroom. Pt had BM on the floor. Several hospital staff, including this RN went to help clean room and clean pt up. Pt back in bed and primary RN at bedside. Pt informed to please use call bell when he needs anything and not to get out of bed without hitting the call bell.

## 2020-05-30 NOTE — ED Notes (Signed)
Pts room air sat remained 92% with ambulation.

## 2020-05-31 DIAGNOSIS — J189 Pneumonia, unspecified organism: Secondary | ICD-10-CM

## 2020-05-31 LAB — CBC
HCT: 32.3 % — ABNORMAL LOW (ref 39.0–52.0)
Hemoglobin: 10.7 g/dL — ABNORMAL LOW (ref 13.0–17.0)
MCH: 31.8 pg (ref 26.0–34.0)
MCHC: 33.1 g/dL (ref 30.0–36.0)
MCV: 95.8 fL (ref 80.0–100.0)
Platelets: 209 10*3/uL (ref 150–400)
RBC: 3.37 MIL/uL — ABNORMAL LOW (ref 4.22–5.81)
RDW: 14 % (ref 11.5–15.5)
WBC: 13 10*3/uL — ABNORMAL HIGH (ref 4.0–10.5)
nRBC: 0 % (ref 0.0–0.2)

## 2020-05-31 LAB — CBC WITH DIFFERENTIAL/PLATELET
Abs Immature Granulocytes: 0.06 10*3/uL (ref 0.00–0.07)
Basophils Absolute: 0 10*3/uL (ref 0.0–0.1)
Basophils Relative: 0 %
Eosinophils Absolute: 0 10*3/uL (ref 0.0–0.5)
Eosinophils Relative: 0 %
HCT: 31.2 % — ABNORMAL LOW (ref 39.0–52.0)
Hemoglobin: 10.4 g/dL — ABNORMAL LOW (ref 13.0–17.0)
Immature Granulocytes: 1 %
Lymphocytes Relative: 14 %
Lymphs Abs: 1.5 10*3/uL (ref 0.7–4.0)
MCH: 31.4 pg (ref 26.0–34.0)
MCHC: 33.3 g/dL (ref 30.0–36.0)
MCV: 94.3 fL (ref 80.0–100.0)
Monocytes Absolute: 0.7 10*3/uL (ref 0.1–1.0)
Monocytes Relative: 7 %
Neutro Abs: 8.8 10*3/uL — ABNORMAL HIGH (ref 1.7–7.7)
Neutrophils Relative %: 78 %
Platelets: 202 10*3/uL (ref 150–400)
RBC: 3.31 MIL/uL — ABNORMAL LOW (ref 4.22–5.81)
RDW: 14 % (ref 11.5–15.5)
WBC: 11.1 10*3/uL — ABNORMAL HIGH (ref 4.0–10.5)
nRBC: 0 % (ref 0.0–0.2)

## 2020-05-31 LAB — STREP PNEUMONIAE URINARY ANTIGEN: Strep Pneumo Urinary Antigen: NEGATIVE

## 2020-05-31 LAB — COMPREHENSIVE METABOLIC PANEL
ALT: 14 U/L (ref 0–44)
AST: 19 U/L (ref 15–41)
Albumin: 2.8 g/dL — ABNORMAL LOW (ref 3.5–5.0)
Alkaline Phosphatase: 32 U/L — ABNORMAL LOW (ref 38–126)
Anion gap: 12 (ref 5–15)
BUN: 26 mg/dL — ABNORMAL HIGH (ref 8–23)
CO2: 26 mmol/L (ref 22–32)
Calcium: 8.6 mg/dL — ABNORMAL LOW (ref 8.9–10.3)
Chloride: 99 mmol/L (ref 98–111)
Creatinine, Ser: 1.03 mg/dL (ref 0.61–1.24)
GFR, Estimated: >60 mL/min (ref >60)
Glucose, Bld: 142 mg/dL — ABNORMAL HIGH (ref 70–99)
Potassium: 3.3 mmol/L — ABNORMAL LOW (ref 3.5–5.1)
Sodium: 137 mmol/L (ref 135–145)
Total Bilirubin: 0.7 mg/dL (ref 0.3–1.2)
Total Protein: 5.8 g/dL — ABNORMAL LOW (ref 6.5–8.1)

## 2020-05-31 LAB — CREATININE, SERUM
Creatinine, Ser: 1.09 mg/dL (ref 0.61–1.24)
GFR, Estimated: >60 mL/min (ref >60)

## 2020-05-31 LAB — TSH: TSH: 0.346 u[IU]/mL — ABNORMAL LOW (ref 0.350–4.500)

## 2020-05-31 LAB — HIV ANTIBODY (ROUTINE TESTING W REFLEX): HIV Screen 4th Generation wRfx: NONREACTIVE

## 2020-05-31 LAB — MAGNESIUM: Magnesium: 1.3 mg/dL — ABNORMAL LOW (ref 1.7–2.4)

## 2020-05-31 LAB — GLUCOSE, CAPILLARY: Glucose-Capillary: 103 mg/dL — ABNORMAL HIGH (ref 70–99)

## 2020-05-31 MED ORDER — BUPROPION HCL ER (SR) 150 MG PO TB12
150.0000 mg | ORAL_TABLET | Freq: Two times a day (BID) | ORAL | Status: DC
  Administered 2020-05-31 – 2020-06-01 (×3): 150 mg via ORAL
  Filled 2020-05-31 (×4): qty 1

## 2020-05-31 MED ORDER — DIVALPROEX SODIUM ER 500 MG PO TB24
500.0000 mg | ORAL_TABLET | Freq: Two times a day (BID) | ORAL | Status: DC
  Administered 2020-05-31 – 2020-06-01 (×3): 500 mg via ORAL
  Filled 2020-05-31 (×4): qty 1

## 2020-05-31 MED ORDER — DONEPEZIL HCL 5 MG PO TABS
10.0000 mg | ORAL_TABLET | Freq: Every day | ORAL | Status: DC
  Administered 2020-05-31: 10 mg via ORAL
  Filled 2020-05-31 (×2): qty 2

## 2020-05-31 MED ORDER — HYDROCODONE-ACETAMINOPHEN 10-325 MG PO TABS: 1.0000 | ORAL_TABLET | ORAL | Status: DC | PRN

## 2020-05-31 MED ORDER — QUETIAPINE FUMARATE 25 MG PO TABS
150.0000 mg | ORAL_TABLET | Freq: Every day | ORAL | Status: DC
  Administered 2020-05-31: 150 mg via ORAL
  Filled 2020-05-31: qty 1

## 2020-05-31 MED ORDER — METOPROLOL SUCCINATE ER 25 MG PO TB24
25.0000 mg | ORAL_TABLET | Freq: Every day | ORAL | Status: DC
  Administered 2020-05-31 – 2020-06-01 (×2): 25 mg via ORAL
  Filled 2020-05-31 (×2): qty 1

## 2020-05-31 MED ORDER — FINASTERIDE 5 MG PO TABS
5.0000 mg | ORAL_TABLET | Freq: Every day | ORAL | Status: DC
  Administered 2020-05-31 – 2020-06-01 (×2): 5 mg via ORAL
  Filled 2020-05-31 (×2): qty 1

## 2020-05-31 MED ORDER — FENOFIBRATE 54 MG PO TABS
54.0000 mg | ORAL_TABLET | Freq: Every day | ORAL | Status: DC
  Administered 2020-05-31 – 2020-06-01 (×2): 54 mg via ORAL
  Filled 2020-05-31 (×2): qty 1

## 2020-05-31 MED ORDER — ROSUVASTATIN CALCIUM 10 MG PO TABS
40.0000 mg | ORAL_TABLET | Freq: Every day | ORAL | Status: DC
  Administered 2020-05-31 – 2020-06-01 (×2): 40 mg via ORAL
  Filled 2020-05-31 (×2): qty 4

## 2020-05-31 MED ORDER — GABAPENTIN 600 MG PO TABS
600.0000 mg | ORAL_TABLET | Freq: Three times a day (TID) | ORAL | Status: DC
  Administered 2020-05-31 (×2): 600 mg via ORAL
  Filled 2020-05-31 (×2): qty 1

## 2020-05-31 MED ORDER — PRIMIDONE 50 MG PO TABS
150.0000 mg | ORAL_TABLET | Freq: Every day | ORAL | Status: DC
Start: 2020-05-31 — End: 2020-06-01
  Administered 2020-05-31: 150 mg via ORAL
  Filled 2020-05-31 (×2): qty 3

## 2020-05-31 MED ORDER — ICOSAPENT ETHYL 1 G PO CAPS
2.0000 g | ORAL_CAPSULE | Freq: Two times a day (BID) | ORAL | Status: DC
  Administered 2020-05-31 – 2020-06-01 (×3): 2 g via ORAL
  Filled 2020-05-31 (×4): qty 2

## 2020-05-31 MED ORDER — MAGNESIUM SULFATE 4 GM/100ML IV SOLN
4.0000 g | Freq: Once | INTRAVENOUS | Status: AC
  Administered 2020-05-31: 4 g via INTRAVENOUS
  Filled 2020-05-31: qty 100

## 2020-05-31 MED ORDER — TAMSULOSIN HCL 0.4 MG PO CAPS
0.4000 mg | ORAL_CAPSULE | Freq: Every day | ORAL | Status: DC
  Administered 2020-05-31 – 2020-06-01 (×2): 0.4 mg via ORAL
  Filled 2020-05-31 (×2): qty 1

## 2020-05-31 MED ORDER — PANTOPRAZOLE SODIUM 40 MG PO TBEC
40.0000 mg | DELAYED_RELEASE_TABLET | Freq: Every day | ORAL | Status: DC
  Administered 2020-05-31 – 2020-06-01 (×2): 40 mg via ORAL
  Filled 2020-05-31 (×2): qty 1

## 2020-05-31 MED ORDER — POTASSIUM CHLORIDE CRYS ER 20 MEQ PO TBCR
20.0000 meq | EXTENDED_RELEASE_TABLET | Freq: Once | ORAL | Status: AC
  Administered 2020-05-31: 20 meq via ORAL
  Filled 2020-05-31: qty 1

## 2020-05-31 MED ORDER — PRIMIDONE 50 MG PO TABS: 150.0000 mg | ORAL_TABLET | Freq: Two times a day (BID) | ORAL | Status: DC

## 2020-05-31 MED ORDER — ASPIRIN EC 81 MG PO TBEC
81.0000 mg | DELAYED_RELEASE_TABLET | Freq: Every day | ORAL | Status: DC
  Administered 2020-05-31 – 2020-06-01 (×2): 81 mg via ORAL
  Filled 2020-05-31 (×2): qty 1

## 2020-05-31 MED ORDER — MEMANTINE HCL 5 MG PO TABS
10.0000 mg | ORAL_TABLET | Freq: Two times a day (BID) | ORAL | Status: DC
  Administered 2020-05-31 – 2020-06-01 (×3): 10 mg via ORAL
  Filled 2020-05-31 (×2): qty 2

## 2020-05-31 MED ORDER — GABAPENTIN 600 MG PO TABS
1200.0000 mg | ORAL_TABLET | Freq: Three times a day (TID) | ORAL | Status: DC
  Administered 2020-05-31 – 2020-06-01 (×3): 1200 mg via ORAL
  Filled 2020-05-31 (×3): qty 2

## 2020-05-31 MED ORDER — PRIMIDONE 250 MG PO TABS
250.0000 mg | ORAL_TABLET | Freq: Every morning | ORAL | Status: DC
Start: 2020-05-31 — End: 2020-06-01
  Administered 2020-05-31 – 2020-06-01 (×2): 250 mg via ORAL
  Filled 2020-05-31 (×2): qty 1

## 2020-05-31 NOTE — Progress Notes (Signed)
Patient complaining of RLS and that he takes Gabapentin 2, 600 mg tablets, three times a day and can not go to sleep without it. On-call Provider, Prudy Feeler notified via secure chat; also conferred with Romualdo Bolk, [facility] regarding home dosage. Waiting for acknowledgement from H. Damita Dunnings; Gabapetin 600 mg given; waiting for additional dose. Barbaraann Faster, RNl 3:42 AM 05/31/2020

## 2020-05-31 NOTE — Progress Notes (Signed)
Progress Note    [name]  [id number] DOB: 1940-11-28  DOA: 05/30/2020 PCP: Leone Haven, MD      Brief Narrative:    Medical records reviewed and are as summarized below:  James Hood is a 80 y.o. male       Assessment/Plan:   Active Problems:   CAP (community acquired pneumonia)   Body mass index is 24.67 kg/m.     Sepsis due to community-acquired pneumonia Acute metabolic encephalopathy Type 2 diabetes mellitus Hypertension Hypokalemia Anxiety and depression BPH Chronic low back pain Dementia Debility   PLAN  Follow-up blood cultures. Continue empiric IV antibiotics. Discontinue IV fluids. Replete potassium and monitor levels Continue psychotropics Consult PT for debility.    Diet Order            Diet Heart Room service appropriate? Yes; Fluid consistency: Thin  Diet effective now                      Medications:   . aspirin EC  81 mg Oral Daily  . buPROPion  150 mg Oral BID  . divalproex  500 mg Oral BID  . donepezil  10 mg Oral QHS  . enoxaparin (LOVENOX) injection  40 mg Subcutaneous Q24H  . fenofibrate  54 mg Oral Daily  . finasteride  5 mg Oral Daily  . gabapentin  1,200 mg Oral TID  . icosapent Ethyl  2 g Oral BID  . memantine  10 mg Oral BID  . metoprolol succinate  25 mg Oral Daily  . pantoprazole  40 mg Oral Daily  . potassium chloride  20 mEq Oral Once  . primidone  250 mg Oral q AM   And  . primidone  150 mg Oral QHS  . QUEtiapine  150 mg Oral QHS  . rosuvastatin  40 mg Oral Daily  . tamsulosin  0.4 mg Oral Daily   Continuous Infusions: . azithromycin Stopped (05/31/20 0336)  . cefTRIAXone (ROCEPHIN)  IV Stopped (05/31/20 0232)     Anti-infectives (From admission, onward)   Start     Dose/Rate Route Frequency Ordered Stop   05/31/20 0105  azithromycin (ZITHROMAX) 500 mg in sodium chloride 0.9 % 250 mL IVPB        500 mg 250 mL/hr over 60 Minutes Intravenous Every 24 hours  05/30/20 2239 06/05/20 0114   05/31/20 0105  cefTRIAXone (ROCEPHIN) 2 g in sodium chloride 0.9 % 100 mL IVPB        2 g 200 mL/hr over 30 Minutes Intravenous Every 24 hours 05/30/20 2239 06/05/20 0114   05/30/20 2015  vancomycin (VANCOCIN) IVPB 1000 mg/200 mL premix        1,000 mg 200 mL/hr over 60 Minutes Intravenous  Once 05/30/20 2011 05/30/20 2139   05/30/20 2015  ceFEPIme (MAXIPIME) 2 g in sodium chloride 0.9 % 100 mL IVPB        2 g 200 mL/hr over 30 Minutes Intravenous  Once 05/30/20 2011 05/30/20 2117             Family Communication/Anticipated D/C date and plan/Code Status   DVT prophylaxis: enoxaparin (LOVENOX) injection 40 mg Start: 05/30/20 2245     Code Status: Full Code  Family Communication: None Disposition Plan:    Status is: Inpatient  Remains inpatient appropriate because:IV treatments appropriate due to intensity of illness or inability to take PO   Dispo: The patient is from: Home  Anticipated d/c is to: Home              Anticipated d/c date is: 1 day              Patient currently is not medically stable to d/c.           Subjective:   C/o cough.  Patient said he was confused prior to admission but that is improving.  No shortness of breath or chest pain.   Objective:    Vitals:   05/31/20 0407 05/31/20 0451 05/31/20 1130 05/31/20 1511  BP: (!) 99/51  126/61 110/65  Pulse: 96  97 77  Resp: 20  16 16   Temp: 98.9 F (37.2 C)  98.2 F (36.8 C) 98 F (36.7 C)  TempSrc: Oral  Oral Oral  SpO2: 92%  94% 96%  Weight:  78 kg    Height:       No data found.   Intake/Output Summary (Last 24 hours) at 05/31/2020 1607 Last data filed at 05/31/2020 1344 Gross per 24 hour  Intake 1860.27 ml  Output 975 ml  Net 885.27 ml   Filed Weights   05/30/20 1645 05/31/20 0100 05/31/20 0451  Weight: 77.1 kg 74.6 kg 78 kg    Exam:  GEN: NAD SKIN: Warm and dry EYES: No pallor or icterus ENT: MMM CV: RRR PULM:  Bibasilar rales. No wheezing ABD: soft, ND, NT, +BS CNS: AAO x 3, non focal EXT: No edema or tenderness   Data Reviewed:   I have personally reviewed following labs and imaging studies:  Labs: Labs show the following:   Basic Metabolic Panel: Recent Labs  Lab 05/30/20 1718 05/31/20 0034 05/31/20 0436  NA 140  --  137  K 3.6  --  3.3*  CL 101  --  99  CO2 24  --  26  GLUCOSE 179*  --  142*  BUN 31*  --  26*  CREATININE 1.09 1.09 1.03  CALCIUM 9.1  --  8.6*   GFR Estimated Creatinine Clearance: 59.1 mL/min (by C-G formula based on SCr of 1.03 mg/dL). Liver Function Tests: Recent Labs  Lab 05/30/20 1718 05/31/20 0436  AST 26 19  ALT 19 14  ALKPHOS 35* 32*  BILITOT 1.2 0.7  PROT 7.0 5.8*  ALBUMIN 3.6 2.8*   No results for input(s): LIPASE, AMYLASE in the last 168 hours. No results for input(s): AMMONIA in the last 168 hours. Coagulation profile Recent Labs  Lab 05/30/20 1718  INR 1.0    CBC: Recent Labs  Lab 05/30/20 1718 05/31/20 0034 05/31/20 0436  WBC 12.5* 13.0* 11.1*  NEUTROABS 10.9*  --  8.8*  HGB 12.4* 10.7* 10.4*  HCT 37.7* 32.3* 31.2*  MCV 95.4 95.8 94.3  PLT 242 209 202   Cardiac Enzymes: No results for input(s): CKTOTAL, CKMB, CKMBINDEX, TROPONINI in the last 168 hours. BNP (last 3 results) No results for input(s): PROBNP in the last 8760 hours. CBG: Recent Labs  Lab 05/31/20 0214  GLUCAP 103*   D-Dimer: No results for input(s): DDIMER in the last 72 hours. Hgb A1c: No results for input(s): HGBA1C in the last 72 hours. Lipid Profile: No results for input(s): CHOL, HDL, LDLCALC, TRIG, CHOLHDL, LDLDIRECT in the last 72 hours. Thyroid function studies: Recent Labs    05/31/20 0034  TSH 0.346*   Anemia work up: No results for input(s): VITAMINB12, FOLATE, FERRITIN, TIBC, IRON, RETICCTPCT in the last 72 hours. Sepsis Labs: Recent Labs  Lab  05/30/20 1718 05/30/20 2030 05/31/20 0034 05/31/20 0436  PROCALCITON 0.57  --   --    --   WBC 12.5*  --  13.0* 11.1*  LATICACIDVEN 1.1 0.9  --   --     Microbiology Recent Results (from the past 240 hour(s))  Culture, blood (Routine x 2)     Status: None (Preliminary result)   Collection Time: 05/30/20  5:18 PM   Specimen: BLOOD  Result Value Ref Range Status   Specimen Description BLOOD BLOOD RIGHT HAND  Final   Special Requests   Final    BOTTLES DRAWN AEROBIC AND ANAEROBIC Blood Culture adequate volume   Culture   Final    NO GROWTH < 24 HOURS Performed at [facility], [address] [place], [place] [address]    Report Status PENDING  Incomplete  Culture, blood (Routine x 2)     Status: None (Preliminary result)   Collection Time: 05/30/20  5:18 PM   Specimen: BLOOD  Result Value Ref Range Status   Specimen Description BLOOD BLOOD RIGHT HAND  Final   Special Requests   Final    BOTTLES DRAWN AEROBIC AND ANAEROBIC Blood Culture adequate volume   Culture   Final    NO GROWTH < 24 HOURS Performed at [facility], [address] [place],  [address]    Report Status PENDING  Incomplete  Resp Panel by RT-PCR (Flu A&B, Covid) Nasopharyngeal Swab     Status: None   Collection Time: 05/30/20  5:33 PM   Specimen: Nasopharyngeal Swab; Nasopharyngeal(NP) swabs in vial transport medium  Result Value Ref Range Status   SARS Coronavirus 2 by RT PCR NEGATIVE NEGATIVE Final    Comment: (NOTE) SARS-CoV-2 target nucleic acids are NOT DETECTED.  The SARS-CoV-2 RNA is generally detectable in upper respiratory specimens during the acute phase of infection. The lowest concentration of SARS-CoV-2 viral copies this assay can detect is 138 copies/mL. A negative result does not preclude SARS-Cov-2 infection and should not be used as the sole basis for treatment or other patient management decisions. A negative result may occur with  improper specimen collection/handling, submission of specimen other than nasopharyngeal swab, presence of viral  mutation(s) within the areas targeted by this assay, and inadequate number of viral copies(<138 copies/mL). A negative result must be combined with clinical observations, patient history, and epidemiological information. The expected result is Negative.  Fact Sheet for Patients:  EntrepreneurPulse.com.au  Fact Sheet for Healthcare Providers:  IncredibleEmployment.be  This test is no t yet approved or cleared by the [place] FDA and  has been authorized for detection and/or diagnosis of SARS-CoV-2 by FDA under an Emergency Use Authorization (EUA). This EUA will remain  in effect (meaning this test can be used) for the duration of the COVID-19 declaration under Section 564(b)(1) of the Act, 21 U.S.C.section 360bbb-3(b)(1), unless the authorization is terminated  or revoked sooner.       Influenza A by PCR NEGATIVE NEGATIVE Final   Influenza B by PCR NEGATIVE NEGATIVE Final    Comment: (NOTE) The Xpert Xpress SARS-CoV-2/FLU/RSV plus assay is intended as an aid in the diagnosis of influenza from Nasopharyngeal swab specimens and should not be used as a sole basis for treatment. Nasal washings and aspirates are unacceptable for Xpert Xpress SARS-CoV-2/FLU/RSV testing.  Fact Sheet for Patients: EntrepreneurPulse.com.au  Fact Sheet for Healthcare Providers: IncredibleEmployment.be  This test is not yet approved or cleared by the [place] FDA and has been  authorized for detection and/or diagnosis of SARS-CoV-2 by FDA under an Emergency Use Authorization (EUA). This EUA will remain in effect (meaning this test can be used) for the duration of the COVID-19 declaration under Section 564(b)(1) of the Act, 21 U.S.C. section 360bbb-3(b)(1), unless the authorization is terminated or revoked.  Performed at [facility], [address] [place], [place] [address]   Culture, blood (routine x 2)  Call MD if unable to obtain prior to antibiotics being given     Status: None (Preliminary result)   Collection Time: 05/31/20 12:46 AM   Specimen: BLOOD  Result Value Ref Range Status   Specimen Description BLOOD LEFT HAND  Final   Special Requests   Final    BOTTLES DRAWN AEROBIC AND ANAEROBIC Blood Culture results may not be optimal due to an excessive volume of blood received in culture bottles   Culture   Final    NO GROWTH < 12 HOURS Performed at [facility], [address], [place], [place] [address]    Report Status PENDING  Incomplete  Culture, blood (routine x 2) Call MD if unable to obtain prior to antibiotics being given     Status: None (Preliminary result)   Collection Time: 05/31/20 12:46 AM   Specimen: BLOOD  Result Value Ref Range Status   Specimen Description BLOOD LEFT ASSIST CONTROL  Final   Special Requests   Final    BOTTLES DRAWN AEROBIC AND ANAEROBIC Blood Culture results may not be optimal due to an excessive volume of blood received in culture bottles   Culture   Final    NO GROWTH < 12 HOURS Performed at [facility], [address] [place], [place] [address]    Report Status PENDING  Incomplete    Procedures and diagnostic studies:  CT Angio Chest PE W and/or Wo Contrast  Result Date: 05/30/2020 CLINICAL DATA:  Shortness of breath EXAM: CT ANGIOGRAPHY CHEST WITH CONTRAST TECHNIQUE: Multidetector CT imaging of the chest was performed using the standard protocol during bolus administration of intravenous contrast. Multiplanar CT image reconstructions and MIPs were obtained to evaluate the vascular anatomy. CONTRAST:  53mL OMNIPAQUE IOHEXOL 350 MG/ML SOLN COMPARISON:  08/04/2019 FINDINGS: Cardiovascular: Heart is normal size. Coronary artery calcifications and scattered aortic calcifications. No aneurysm. No filling defects in the pulmonary arteries to suggest pulmonary emboli. Mediastinum/Nodes: No mediastinal, hilar, or axillary  adenopathy. Few scattered mediastinal calcified lymph nodes. Trachea and esophagus are unremarkable. Small bilateral thyroid nodules are stable since prior study, measuring up to 1.5 cm. This has been previously biopsied and unchanged. No follow-up imaging recommended based on prior biopsy (ref: J Am Coll Radiol. 2015 Feb;12(2): 143-50). Lungs/Pleura: Bibasilar airspace opacities could reflect atelectasis although early infiltrate/pneumonia possible. No effusions. Upper Abdomen: Imaging into the upper abdomen demonstrates no acute findings. Musculoskeletal: Chest wall soft tissues are unremarkable. No acute bony abnormality. Review of the MIP images confirms the above findings. IMPRESSION: No evidence of pulmonary embolus. Coronary artery disease. Bibasilar airspace opacities, atelectasis versus developing infiltrate/pneumonia. Old granulomatous disease. Electronically Signed   By: Rolm Baptise M.D.   On: 05/30/2020 21:13   DG Chest Port 1 View  Result Date: 05/30/2020 CLINICAL DATA:  80 year old male with cough. EXAM: PORTABLE CHEST 1 VIEW COMPARISON:  Chest radiograph dated 08/14/2016 FINDINGS: Minimal left lung base atelectasis. No focal consolidation, pleural effusion, pneumothorax. Top-normal cardiac size. Atherosclerotic calcification of the aorta. No acute osseous pathology. IMPRESSION: No active disease. Electronically Signed   By: Milas Hock  Radparvar M.D.   On: 05/30/2020 18:37               LOS: 1 day   Izumi Mixon  [facility]   Pager on [email or web address]. If 7PM-7AM, please contact night-coverage at [email or web address]     05/31/2020, 4:07 PM

## 2020-05-31 NOTE — Evaluation (Signed)
Physical Therapy Evaluation Patient Details Name: James Hood MRN: [id number] DOB: 01/19/1941 Today's Date: 05/31/2020   History of Present Illness  Pt is an 80 y.o. male presenting to hospital 1/10 with cough, AMS, and fever.  Pt admitted with sepsis d/t community acquired PNA.  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  Clinical Impression  Prior to hospital admission, pt was modified independent ambulating with [facility]; lives with his wife in multi-story home with steps to navigate.  No c/o pain during session.  A&O x4.  Currently pt is CGA with transfers and ambulation 190 feet with SPC.  Pt initially mildly unsteady with ambulation (CGA provided for safety) but improved gait mechanics and balance noted with increased distance ambulating.  O2 sats 91% or greater on room air during sessions activities.  Pt would benefit from skilled PT to address noted impairments and functional limitations (see below for any additional details).  Upon hospital discharge, pt would benefit from OP PT for higher level balance.    Follow Up Recommendations Outpatient PT    Equipment Recommendations  None recommended by PT (pt has own [facility])    Recommendations for Other Services       Precautions / Restrictions Precautions Precautions: Fall Restrictions Weight Bearing Restrictions: No      Mobility  Bed Mobility Overal bed mobility: Needs Assistance Bed Mobility: Supine to Sit     Supine to sit: Min assist;HOB elevated;Supervision     General bed mobility comments: assist for trunk    Transfers Overall transfer level: Needs assistance Equipment used: None Transfers: Sit to/from Stand Sit to Stand: Min guard         General transfer comment: mild increased  effort to stand from bed; controlled descent noted sitting in recliner and onto bed  Ambulation/Gait Ambulation/Gait assistance: Min guard Gait Distance (Feet): 3 Feet (3 feet bed to recliner and then 190 feet with SPC) Assistive device: None   Gait velocity: decreased   General Gait Details: mild increased effort/time to perform (pt appearing more cautious with steps) going bed to recliner; pt initially mildly unsteady with ambulation (CGA provided for safety) but improved steadiness noted with increased distance ambulating (step through gait pattern)  Stairs            Wheelchair Mobility    Modified Rankin (Stroke Patients Only)       Balance Overall balance assessment: Needs assistance Sitting-balance support: No upper extremity supported;Feet supported Sitting balance-Leahy Scale: Normal Sitting balance - Comments: steady sitting reaching outside BOS   Standing balance support: No upper extremity supported Standing balance-Leahy Scale: Good Standing balance comment: steady standing reaching within BOS                             Pertinent Vitals/Pain Pain Assessment: No/denies pain  HR 90-113 bpm during sessions activities.    Home Living Family/patient expects to be discharged to:: Private residence Living Arrangements: Spouse/significant other Available Help at Discharge: Family Type of Home: House Home Access: Stairs to enter Entrance Stairs-Rails: Left Entrance Stairs-Number of Steps: 3 STE with L railing Home Layout: Multi-level Home Equipment: Walker - 2 wheels;Walker - 4 wheels;Cane - single point;Grab bars - tub/shower      Prior Function Level  of Independence: Independent with assistive device(s)         Comments: Ambulatory with SPC; (+) driving     Hand Dominance        Extremity/Trunk Assessment   Upper Extremity Assessment Upper Extremity Assessment: Generalized weakness    Lower Extremity Assessment Lower Extremity  Assessment: Generalized weakness    Cervical / Trunk Assessment Cervical / Trunk Assessment: Normal  Communication   Communication: No difficulties  Cognition Arousal/Alertness: Awake/alert Behavior During Therapy: WFL for tasks assessed/performed Overall Cognitive Status: Within Functional Limits for tasks assessed                                        General Comments   Nursing cleared pt for participation in physical therapy.  Pt agreeable to PT session.    Exercises     Assessment/Plan    PT Assessment Patient needs continued PT services  PT Problem List Decreased strength;Decreased activity tolerance;Decreased balance;Decreased mobility       PT Treatment Interventions DME instruction;Gait training;Stair training;Functional mobility training;Therapeutic activities;Therapeutic exercise;Balance training;Patient/family education    PT Goals (Current goals can be found in the Care Plan section)  Acute Rehab PT Goals Patient Stated Goal: to go home PT Goal Formulation: With patient Time For Goal Achievement: 06/14/20 Potential to Achieve Goals: Good    Frequency Min 2X/week   Barriers to discharge        Co-evaluation               AM-PAC PT "6 Clicks" Mobility  Outcome Measure Help needed turning from your back to your side while in a flat bed without using bedrails?: None Help needed moving from lying on your back to sitting on the side of a flat bed without using bedrails?: A Little Help needed moving to and from a bed to a chair (including a wheelchair)?: A Little Help needed standing up from a chair using your arms (e.g., wheelchair or bedside chair)?: A Little Help needed to walk in hospital room?: A Little Help needed climbing 3-5 steps with a railing? : A Little 6 Click Score: 19    End of Session Equipment Utilized During Treatment: Gait belt Activity Tolerance: Patient tolerated treatment well Patient left: in bed;with call  bell/phone within reach;with bed alarm set Nurse Communication: Mobility status;Precautions PT Visit Diagnosis: Unsteadiness on feet (R26.81);Muscle weakness (generalized) (M62.81);History of falling (Z91.81)    Time: 0925 (pt's breakfast tray arrived so therapist returned 1015-1028 for ambulation portion of session)-0940 PT Time Calculation (min) (ACUTE ONLY): 15 min   Charges:   PT Evaluation $PT Eval Low Complexity: 1 Low PT Treatments $Therapeutic Activity: 8-22 mins       Leitha Bleak, PT 05/31/20, 12:13 PM

## 2020-05-31 NOTE — Progress Notes (Signed)
Mobility Specialist - Progress Note   05/31/20 1408  Mobility  Activity Refused mobility  Mobility performed by Mobility specialist    Pt asleep upon arrival. Pt easily awakens. Pt refused session at this time. Desires to nap. Will re-attempt at a later date/time.    Shareta Fishbaugh Mobility Specialist  05/31/20, 2:10 PM

## 2020-06-01 ENCOUNTER — Ambulatory Visit: Payer: PPO

## 2020-06-01 ENCOUNTER — Other Ambulatory Visit: Payer: Self-pay

## 2020-06-01 ENCOUNTER — Telehealth: Payer: Self-pay | Admitting: Family Medicine

## 2020-06-01 DIAGNOSIS — R6520 Severe sepsis without septic shock: Secondary | ICD-10-CM

## 2020-06-01 DIAGNOSIS — G9341 Metabolic encephalopathy: Secondary | ICD-10-CM | POA: Diagnosis present

## 2020-06-01 DIAGNOSIS — A419 Sepsis, unspecified organism: Secondary | ICD-10-CM | POA: Diagnosis present

## 2020-06-01 LAB — CBC WITH DIFFERENTIAL/PLATELET
Abs Immature Granulocytes: 0.02 10*3/uL (ref 0.00–0.07)
Basophils Absolute: 0 10*3/uL (ref 0.0–0.1)
Basophils Relative: 0 %
Eosinophils Absolute: 0.1 10*3/uL (ref 0.0–0.5)
Eosinophils Relative: 1 %
HCT: 31 % — ABNORMAL LOW (ref 39.0–52.0)
Hemoglobin: 10.2 g/dL — ABNORMAL LOW (ref 13.0–17.0)
Immature Granulocytes: 0 %
Lymphocytes Relative: 19 %
Lymphs Abs: 1.3 10*3/uL (ref 0.7–4.0)
MCH: 31.5 pg (ref 26.0–34.0)
MCHC: 32.9 g/dL (ref 30.0–36.0)
MCV: 95.7 fL (ref 80.0–100.0)
Monocytes Absolute: 0.6 10*3/uL (ref 0.1–1.0)
Monocytes Relative: 9 %
Neutro Abs: 4.7 10*3/uL (ref 1.7–7.7)
Neutrophils Relative %: 71 %
Platelets: 203 10*3/uL (ref 150–400)
RBC: 3.24 MIL/uL — ABNORMAL LOW (ref 4.22–5.81)
RDW: 13.4 % (ref 11.5–15.5)
WBC: 6.7 10*3/uL (ref 4.0–10.5)
nRBC: 0 % (ref 0.0–0.2)

## 2020-06-01 LAB — MAGNESIUM: Magnesium: 2.2 mg/dL (ref 1.7–2.4)

## 2020-06-01 LAB — BASIC METABOLIC PANEL
Anion gap: 10 (ref 5–15)
BUN: 20 mg/dL (ref 8–23)
CO2: 29 mmol/L (ref 22–32)
Calcium: 9 mg/dL (ref 8.9–10.3)
Chloride: 99 mmol/L (ref 98–111)
Creatinine, Ser: 0.96 mg/dL (ref 0.61–1.24)
GFR, Estimated: >60 mL/min (ref >60)
Glucose, Bld: 95 mg/dL (ref 70–99)
Potassium: 3.5 mmol/L (ref 3.5–5.1)
Sodium: 138 mmol/L (ref 135–145)

## 2020-06-01 LAB — LEGIONELLA PNEUMOPHILA SEROGP 1 UR AG: L. pneumophila Serogp 1 Ur Ag: NEGATIVE

## 2020-06-01 MED ORDER — AMOXICILLIN-POT CLAVULANATE 875-125 MG PO TABS: 1.0000 | ORAL_TABLET | Freq: Two times a day (BID) | ORAL | 0 refills | Status: AC

## 2020-06-01 NOTE — TOC Progression Note (Signed)
Transition of Care [facility]) - Progression Note    Patient Details  Name: James Hood MRN: [id number] Date of Birth: November 27, 1940  [facility] CM/SW [place], RN Phone Number: 06/01/2020, 11:02 AM  Clinical Narrative: TOC spoke with patient and wife at bedside about discharge needs for home health PT. Both declined, voices PCP with PT appointments already scheduled that they will keep them. Wife states home has equipment needed for safe ambulation. No TOC needs at this time.      Expected Discharge Plan and Services           Expected Discharge Date: 06/01/20                                     Social Determinants of Health (SDOH) Interventions    Readmission Risk Interventions No flowsheet data found.

## 2020-06-01 NOTE — Telephone Encounter (Signed)
[place] called to schedule Hospital follow -up for patience on schedule for 06-08-20

## 2020-06-01 NOTE — Progress Notes (Signed)
Mobility Specialist - Progress Note   06/01/20 1100  Mobility  Activity Ambulated in hall  Level of Assistance Modified independent, requires aide device or extra time  Assistive Device Cane  Distance Ambulated (ft) 170 ft  Mobility Response Tolerated well  Mobility performed by Mobility specialist  $Mobility charge 1 Mobility    Pre-mobility: 89 HR, 93% SpO2 During mobility: 104 HR, 90% SpO2 Post-mobility: 93 HR, 92% SpO2   Pt was lying in bed upon arrival with spouse present in room. Pt agreed to session. Pt utilizing room air. Pt denied pain, nausea, or fatigue. Pt was able to get EOB with minA and stood at bedside independently. Pt uses SPC for ambulation. Pt ambulated 170' in hallway with no LOB noted. O2 > 89% with activity. Pt denied dizziness, SOB, or weakness throughout session. Overall, pt tolerated session well. Pt was educated on PLB and technique prior to exit. Pt states he's been in talks with PCP and has scheduled PT appointments upon d/c. Pt left in bed with all needs in reach.    James Hood Mobility Specialist 06/01/20, 11:22 AM

## 2020-06-01 NOTE — Telephone Encounter (Signed)
I called the patient's daughter to check on the patient and he is in the hospital.  I lvm letting her know I called to check on patient.  Telitha Plath,cma

## 2020-06-01 NOTE — Telephone Encounter (Signed)
Patient called in wanted to come in earlier than 1-19 just got home from hospital was in hospital with pneumonia and he still has a cough

## 2020-06-01 NOTE — Plan of Care (Signed)
Discharge order received. Patient mental status is at baseline. Vital signs stable . No signs of acute distress. Discharge instructions given. Patient verbalized understanding. No other issues noted at this time.   

## 2020-06-01 NOTE — Telephone Encounter (Signed)
Patient called in wanted to come in earlier than 1-19 just got home from hospital was in hospital with pneumonia and he still has a cough.  Thaila Bottoms,cma

## 2020-06-01 NOTE — Discharge Summary (Addendum)
Physician Discharge Summary  James Hood [id number] DOB: Apr 30, 1941 DOA: 05/30/2020  PCP: Leone Haven, MD  Admit date: 05/30/2020 Discharge date: 06/01/2020  Discharge disposition: Home   Recommendations for Outpatient Follow-Up:   Follow-up with PCP in 1 week. Outpatient physical therapy recommended.   Discharge Diagnosis:   Principal Problem:   Severe sepsis ([place]) Active Problems:   CAP (community acquired pneumonia)   Acute metabolic encephalopathy    Discharge Condition: Stable.  Diet recommendation:  Diet Order            Diet - low sodium heart healthy                   Code Status: Prior     Hospital Course:    James Hood is a 80 y.o. 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  He presented to the hospital because of cough, congestion, fever and confusion.  He was admitted to the hospital for severe sepsis secondary to community-acquired pneumonia complicated by acute metabolic encephalopathy.  He was treated with empiric IV antibiotics and IV fluids.  He also had hypokalemia that was treated.  His condition has improved.  He is tolerating room air.  He is deemed stable for discharge to home.  Discharge plan was discussed with the patient and his wife at the bedside.     Discharge Exam:    Vitals:   05/31/20 1945 05/31/20 2327 06/01/20 0614 06/01/20 0735  BP: 132/74 107/62 132/81 136/66  Pulse: 77 73 83 81  Resp: 20 18 16 18   Temp: 98.2 F (36.8 C) (!) 97.4 F (36.3 C) 98.4 F (36.9 C) 99 F (37.2 C)  TempSrc: Oral Oral  Oral  SpO2: 96% 92% 94% 92%   Weight:      Height:         GEN: NAD SKIN: Warm and dry EYES: No pallor or icterus ENT: MMM CV: RRR PULM: Bibasilar rales.  No wheezing heard.   ABD: soft, ND, NT, +BS CNS: AAO x 3, non focal EXT: No edema or tenderness   The results of significant diagnostics from this hospitalization (including imaging, microbiology, ancillary and laboratory) are listed below for reference.     Procedures and Diagnostic Studies:   CT Angio Chest PE W and/or Wo Contrast  Result Date: 05/30/2020 CLINICAL DATA:  Shortness of breath EXAM: CT ANGIOGRAPHY CHEST WITH CONTRAST TECHNIQUE: Multidetector CT imaging of the chest was performed using the standard protocol during bolus administration of intravenous contrast. Multiplanar CT image reconstructions and MIPs were obtained to evaluate the vascular anatomy. CONTRAST:  94mL OMNIPAQUE IOHEXOL 350 MG/ML SOLN COMPARISON:  08/04/2019 FINDINGS: Cardiovascular: Heart is normal size. Coronary artery calcifications and scattered aortic calcifications. No aneurysm. No filling defects in the pulmonary arteries to suggest pulmonary emboli. Mediastinum/Nodes: No mediastinal, hilar, or axillary adenopathy. Few scattered mediastinal calcified lymph nodes. Trachea and esophagus are unremarkable. Small bilateral thyroid nodules are stable since prior study, measuring up to 1.5 cm. This has been previously biopsied and unchanged. No follow-up imaging recommended based on prior biopsy (ref: J Am Coll Radiol. 2015 Feb;12(2): 143-50). Lungs/Pleura: Bibasilar airspace opacities could reflect atelectasis although early infiltrate/pneumonia possible. No effusions. Upper  Abdomen: Imaging into the upper abdomen demonstrates no acute findings. Musculoskeletal: Chest wall soft tissues are unremarkable. No acute bony abnormality. Review of the MIP images confirms the above findings. IMPRESSION: No evidence of pulmonary embolus. Coronary artery disease. Bibasilar airspace opacities,  atelectasis versus developing infiltrate/pneumonia. Old granulomatous disease. Electronically Signed   By: Rolm Baptise M.D.   On: 05/30/2020 21:13   DG Chest Port 1 View  Result Date: 05/30/2020 CLINICAL DATA:  80 year old male with cough. EXAM: PORTABLE CHEST 1 VIEW COMPARISON:  Chest radiograph dated 08/14/2016 FINDINGS: Minimal left lung base atelectasis. No focal consolidation, pleural effusion, pneumothorax. Top-normal cardiac size. Atherosclerotic calcification of the aorta. No acute osseous pathology. IMPRESSION: No active disease. Electronically Signed   By: Anner Crete M.D.   On: 05/30/2020 18:37     Labs:   Basic Metabolic Panel: Recent Labs  Lab 05/30/20 1718 05/31/20 0034 05/31/20 0436 06/01/20 0503  NA 140  --  137 138  K 3.6  --  3.3* 3.5  CL 101  --  99 99  CO2 24  --  26 29  GLUCOSE 179*  --  142* 95  BUN 31*  --  26* 20  CREATININE 1.09 1.09 1.03 0.96  CALCIUM 9.1  --  8.6* 9.0  MG  --   --  1.3* 2.2   GFR Estimated Creatinine Clearance: 63.4 mL/min (by C-G formula based on SCr of 0.96 mg/dL). Liver Function Tests: Recent Labs  Lab 05/30/20 1718 05/31/20 0436  AST 26 19  ALT 19 14  ALKPHOS 35* 32*  BILITOT 1.2 0.7  PROT 7.0 5.8*  ALBUMIN 3.6 2.8*   No results for input(s): LIPASE, AMYLASE in the last 168 hours. No results for input(s): AMMONIA in the last 168 hours. Coagulation profile Recent Labs  Lab 05/30/20 1718  INR 1.0    CBC: Recent Labs  Lab 05/30/20 1718 05/31/20 0034 05/31/20 0436 06/01/20 0503  WBC 12.5* 13.0* 11.1* 6.7  NEUTROABS 10.9*  --  8.8* 4.7  HGB 12.4* 10.7* 10.4* 10.2*  HCT 37.7* 32.3* 31.2* 31.0*  MCV 95.4 95.8 94.3 95.7  PLT 242 209 202 203   Cardiac Enzymes: No results for input(s): CKTOTAL, CKMB, CKMBINDEX, TROPONINI in the last 168 hours. BNP: Invalid input(s): POCBNP CBG: Recent Labs  Lab 05/31/20 0214  GLUCAP 103*   D-Dimer No results for input(s): DDIMER in the last 72 hours. Hgb A1c No  results for input(s): HGBA1C in the last 72 hours. Lipid Profile No results for input(s): CHOL, HDL, LDLCALC, TRIG, CHOLHDL, LDLDIRECT in the last 72 hours. Thyroid function studies Recent Labs    05/31/20 0034  TSH 0.346*   Anemia work up No results for input(s): VITAMINB12, FOLATE, FERRITIN, TIBC, IRON, RETICCTPCT in the last 72 hours. Microbiology Recent Results (from the past 240 hour(s))  Culture, blood (Routine x 2)     Status: None (Preliminary result)   Collection Time: 05/30/20  5:18 PM   Specimen: BLOOD  Result Value Ref Range Status   Specimen Description BLOOD BLOOD RIGHT HAND  Final   Special Requests   Final    BOTTLES DRAWN AEROBIC AND ANAEROBIC Blood Culture adequate volume   Culture   Final    NO GROWTH 2 DAYS Performed at [facility], [address] [place], [place] [address]    Report Status PENDING  Incomplete  Culture, blood (Routine x 2)     Status: None (Preliminary result)   Collection Time: 05/30/20  5:18 PM  Specimen: BLOOD  Result Value Ref Range Status   Specimen Description BLOOD BLOOD RIGHT HAND  Final   Special Requests   Final    BOTTLES DRAWN AEROBIC AND ANAEROBIC Blood Culture adequate volume   Culture   Final    NO GROWTH 2 DAYS Performed at [facility], [address], [place], [place] [address]    Report Status PENDING  Incomplete  Resp Panel by RT-PCR (Flu A&B, Covid) Nasopharyngeal Swab     Status: None   Collection Time: 05/30/20  5:33 PM   Specimen: Nasopharyngeal Swab; Nasopharyngeal(NP) swabs in vial transport medium  Result Value Ref Range Status   SARS Coronavirus 2 by RT PCR NEGATIVE NEGATIVE Final    Comment: (NOTE) SARS-CoV-2 target nucleic acids are NOT DETECTED.  The SARS-CoV-2 RNA is generally detectable in upper respiratory specimens during the acute phase of infection. The lowest concentration of SARS-CoV-2 viral copies this assay can detect is 138 copies/mL. A negative result does not  preclude SARS-Cov-2 infection and should not be used as the sole basis for treatment or other patient management decisions. A negative result may occur with  improper specimen collection/handling, submission of specimen other than nasopharyngeal swab, presence of viral mutation(s) within the areas targeted by this assay, and inadequate number of viral copies(<138 copies/mL). A negative result must be combined with clinical observations, patient history, and epidemiological information. The expected result is Negative.  Fact Sheet for Patients:  EntrepreneurPulse.com.au  Fact Sheet for Healthcare Providers:  IncredibleEmployment.be  This test is no t yet approved or cleared by the [place] FDA and  has been authorized for detection and/or diagnosis of SARS-CoV-2 by FDA under an Emergency Use Authorization (EUA). This EUA will remain  in effect (meaning this test can be used) for the duration of the COVID-19 declaration under Section 564(b)(1) of the Act, 21 U.S.C.section 360bbb-3(b)(1), unless the authorization is terminated  or revoked sooner.       Influenza A by PCR NEGATIVE NEGATIVE Final   Influenza B by PCR NEGATIVE NEGATIVE Final    Comment: (NOTE) The Xpert Xpress SARS-CoV-2/FLU/RSV plus assay is intended as an aid in the diagnosis of influenza from Nasopharyngeal swab specimens and should not be used as a sole basis for treatment. Nasal washings and aspirates are unacceptable for Xpert Xpress SARS-CoV-2/FLU/RSV testing.  Fact Sheet for Patients: EntrepreneurPulse.com.au  Fact Sheet for Healthcare Providers: IncredibleEmployment.be  This test is not yet approved or cleared by the [place] FDA and has been authorized for detection and/or diagnosis of SARS-CoV-2 by FDA under an Emergency Use Authorization (EUA). This EUA will remain in effect (meaning this test can be used) for the  duration of the COVID-19 declaration under Section 564(b)(1) of the Act, 21 U.S.C. section 360bbb-3(b)(1), unless the authorization is terminated or revoked.  Performed at [facility], [address] [place], [place] [address]   Culture, blood (routine x 2) Call MD if unable to obtain prior to antibiotics being given     Status: None (Preliminary result)   Collection Time: 05/31/20 12:46 AM   Specimen: BLOOD  Result Value Ref Range Status   Specimen Description BLOOD LEFT HAND  Final   Special Requests   Final    BOTTLES DRAWN AEROBIC AND ANAEROBIC Blood Culture results may not be optimal due to an excessive volume of blood received in culture bottles   Culture   Final    NO GROWTH 1 DAY Performed at [facility], [address] [place], [place] [address]    Report Status PENDING  Incomplete  Culture, blood (routine x 2) Call MD if unable to obtain prior to antibiotics being given     Status: None (Preliminary result)   Collection Time: 05/31/20 12:46 AM   Specimen: BLOOD  Result Value Ref Range Status   Specimen Description BLOOD LEFT ASSIST CONTROL  Final   Special Requests   Final    BOTTLES DRAWN AEROBIC AND ANAEROBIC Blood Culture results may not be optimal due to an excessive volume of blood received in culture bottles   Culture   Final    NO GROWTH 1 DAY Performed at [facility], [address] [place], [place] [address]    Report Status PENDING  Incomplete     Discharge Instructions:   Discharge Instructions    Diet - low sodium heart healthy   Complete by: As directed    Increase activity slowly   Complete by: As directed      Allergies as of 06/01/2020   No Known Allergies     Medication List    TAKE these medications   amoxicillin-clavulanate 875-125 MG tablet Commonly known as: Augmentin Take 1 tablet by mouth 2 (two) times daily for 3 days.   aspirin EC 81 MG tablet Take 81 mg by mouth daily.   budesonide 3 MG 24  hr capsule Commonly known as: ENTOCORT EC Take 9 mg by mouth daily.   buPROPion 150 MG 12 hr tablet Commonly known as: WELLBUTRIN SR TAKE ONE TABLET BY MOUTH TWICE A DAY   CENTRUM SILVER PO Take 1 tablet by mouth daily.   diphenoxylate-atropine 2.5-0.025 MG tablet Commonly known as: LOMOTIL Take 1 tablet by mouth 4 (four) times daily as needed.   divalproex 500 MG 24 hr tablet Commonly known as: DEPAKOTE ER Take 1 tablet by mouth 2 (two) times daily.   donepezil 10 MG tablet Commonly known as: ARICEPT Take 10 mg by mouth at bedtime.   fenofibrate 145 MG tablet Commonly known as: TRICOR TAKE ONE TABLET BY MOUTH DAILY   finasteride 5 MG tablet Commonly known as: PROSCAR TAKE ONE TABLET BY MOUTH DAILY   FreeStyle Libre 14 Day Reader Kerrin Mo USE DAILY AS DIRECED TO CHECK BLOOD GLUCOSE   FreeStyle Libre 14 Day Sensor Misc APPLY ONE SENSOR EVERY 14 DAYS TO MONITOR BLOOD GLUCOSE LEVELS.  REMOVE OLD SENSOR BEFORE APPLYING NEW SENSOR   gabapentin 600 MG tablet Commonly known as: NEURONTIN TAKE TWO TABLETS BY MOUTH THREE TIMES A DAY   HYDROcodone-acetaminophen 10-325 MG tablet Commonly known as: Norco Take 1 tablet by mouth every 4 (four) hours as needed for severe pain. Must last 30 days.   HYDROcodone-acetaminophen 10-325 MG tablet Commonly known as: Norco Take 1 tablet by mouth every 4 (four) hours as needed for severe pain. Must last 30 days. Start taking on: June 29, 2020   icosapent Ethyl 1 g capsule Commonly known as: VASCEPA Take 2 g by mouth 2 (two) times daily.   Jardiance 25 MG Tabs tablet Generic drug: empagliflozin TAKE ONE TABLET BY MOUTH DAILY   lisinopril-hydrochlorothiazide 20-25 MG tablet Commonly known as: ZESTORETIC TAKE ONE TABLET BY MOUTH TWICE A DAY   memantine 10 MG tablet Commonly known as: NAMENDA TAKE ONE TABLET BY MOUTH TWICE A DAY   metoprolol succinate 25 MG 24 hr tablet Commonly known as: TOPROL-XL Take 25 mg by mouth daily.    omeprazole 20 MG capsule Commonly known as: PRILOSEC Take  20 mg by mouth 2 (two) times daily before a meal.   Ozempic (0.25 or 0.5 MG/DOSE) 2 MG/1.5ML Sopn Generic drug: Semaglutide(0.25 or 0.5MG /DOS) Inject 0.5 mg into the skin once a week.   primidone 50 MG tablet Commonly known as: MYSOLINE Take 150-250 mg by mouth 2 (two) times daily. Take 250 mg by mouth in the morning & take 150 mg at night.   QUEtiapine 100 MG tablet Commonly known as: SEROQUEL Take 150 mg by mouth at bedtime.   rosuvastatin 40 MG tablet Commonly known as: CRESTOR TAKE ONE TABLET BY MOUTH DAILY   tamsulosin 0.4 MG Caps capsule Commonly known as: FLOMAX TAKE ONE CAPSULE BY MOUTH DAILY AFTER SUPPER       Follow-up Information    Leone Haven, MD. Go on 06/08/2020.   Specialty: Family Medicine Why: 1:30pm appointment Contact information: [place] [address] [phone]                Time coordinating discharge: 31 minutes  Signed:  Jennye Boroughs  [facility] 06/01/2020, 8:40 PM   Pager on [email or web address]. If 7PM-7AM, please contact night-coverage at [email or web address]

## 2020-06-02 ENCOUNTER — Telehealth: Payer: Self-pay

## 2020-06-02 NOTE — Telephone Encounter (Signed)
I called and spoke with the patient and informed him that his visit on the 19th of this month would be virtual and he understood.  Treylon Henard,cma

## 2020-06-02 NOTE — Telephone Encounter (Signed)
Pt called wanting to discuss why he can't come into the office  Tried to explain to patient why

## 2020-06-02 NOTE — Telephone Encounter (Signed)
Unfortunately with him having had a COVID test he could not physically come in to the office until 06/13/20. His visit on the 19th should be virtual though we could try to do a virtual visit sooner if he would like. His cough could persist for a number of weeks. That is fairly typical following pneumonia.

## 2020-06-02 NOTE — Telephone Encounter (Signed)
Transition Care Management Follow-up Telephone Call  Date of discharge and from where: 06/02/19 from [facility]  How have you been since you were released from the hospital? Patient states, I am doing well. Also resting well. Occasional cough. Denies fever, pain, n/v/d, confusion, pain, headache and all other symptoms. Monitoring oxygen room air 95% or above. Intake/output appropriate.   Any questions or concerns? No  Items Reviewed:  Did the pt receive and understand the discharge instructions provided? Yes , increase activity slowly.   Medications obtained and verified? Yes   Other? No   Any new allergies since your discharge? No   Dietary orders reviewed? Low sodium heart healthy  Do you have support at home? Yes   Home Care and Equipment/Supplies: Were home health services ordered? No  Functional Questionnaire: (I = Independent and D = Dependent) ADLs: I  Follow up appointments reviewed:   PCP Hospital f/u appt confirmed? Yes  Scheduled to see Dr. Caryl Bis on 06/08/20 @ 11:30. Virtual visit.   [facility] f/u appt confirmed? None required at this time.    Are transportation arrangements needed? No   If their condition worsens, is the pt aware to call PCP or go to the Emergency Dept.? Yes  Was the patient provided with contact information for the PCP's office or ED? Yes  Was to pt encouraged to call back with questions or concerns? Yes

## 2020-06-03 NOTE — Telephone Encounter (Signed)
Reviewed

## 2020-06-03 NOTE — Telephone Encounter (Signed)
I called the patient and informed him that his Ozempic came in today and he stated he had enough to carry him through the weekend and he will be in next week to pick it up. I also explained to the patient why he could not come into the office and he understood.  Lashai Grosch,cma

## 2020-06-04 LAB — CULTURE, BLOOD (ROUTINE X 2)
Culture: NO GROWTH
Culture: NO GROWTH
Special Requests: ADEQUATE
Special Requests: ADEQUATE

## 2020-06-05 LAB — CULTURE, BLOOD (ROUTINE X 2)
Culture: NO GROWTH
Culture: NO GROWTH

## 2020-06-08 ENCOUNTER — Encounter: Payer: Self-pay | Admitting: Family Medicine

## 2020-06-08 ENCOUNTER — Other Ambulatory Visit: Payer: Self-pay

## 2020-06-08 ENCOUNTER — Emergency Department
Admission: EM | Admit: 2020-06-08 | Discharge: 2020-06-08 | Disposition: A | Discharge status: Home / Self Care | Payer: PPO | Attending: Emergency Medicine | Admitting: Emergency Medicine

## 2020-06-08 ENCOUNTER — Emergency Department: Payer: PPO

## 2020-06-08 ENCOUNTER — Telehealth (INDEPENDENT_AMBULATORY_CARE_PROVIDER_SITE_OTHER): Payer: PPO | Admitting: Family Medicine

## 2020-06-08 DIAGNOSIS — J189 Pneumonia, unspecified organism: Secondary | ICD-10-CM | POA: Diagnosis not present

## 2020-06-08 DIAGNOSIS — R0902 Hypoxemia: Secondary | ICD-10-CM

## 2020-06-08 DIAGNOSIS — I10 Essential (primary) hypertension: Secondary | ICD-10-CM | POA: Diagnosis not present

## 2020-06-08 DIAGNOSIS — Z794 Long term (current) use of insulin: Secondary | ICD-10-CM | POA: Diagnosis not present

## 2020-06-08 DIAGNOSIS — Z7982 Long term (current) use of aspirin: Secondary | ICD-10-CM | POA: Insufficient documentation

## 2020-06-08 DIAGNOSIS — R9431 Abnormal electrocardiogram [ECG] [EKG]: Secondary | ICD-10-CM | POA: Diagnosis not present

## 2020-06-08 DIAGNOSIS — Z79899 Other long term (current) drug therapy: Secondary | ICD-10-CM | POA: Diagnosis not present

## 2020-06-08 DIAGNOSIS — Z96651 Presence of right artificial knee joint: Secondary | ICD-10-CM | POA: Diagnosis not present

## 2020-06-08 DIAGNOSIS — F0390 Unspecified dementia without behavioral disturbance: Secondary | ICD-10-CM | POA: Diagnosis not present

## 2020-06-08 DIAGNOSIS — Z7984 Long term (current) use of oral hypoglycemic drugs: Secondary | ICD-10-CM | POA: Insufficient documentation

## 2020-06-08 DIAGNOSIS — R918 Other nonspecific abnormal finding of lung field: Secondary | ICD-10-CM | POA: Diagnosis not present

## 2020-06-08 DIAGNOSIS — F17210 Nicotine dependence, cigarettes, uncomplicated: Secondary | ICD-10-CM | POA: Insufficient documentation

## 2020-06-08 DIAGNOSIS — E119 Type 2 diabetes mellitus without complications: Secondary | ICD-10-CM | POA: Insufficient documentation

## 2020-06-08 LAB — URINALYSIS, COMPLETE (UACMP) WITH MICROSCOPIC
Bacteria, UA: NONE SEEN
Bilirubin Urine: NEGATIVE
Glucose, UA: >=500 mg/dL — AB
Hgb urine dipstick: NEGATIVE
Ketones, ur: NEGATIVE mg/dL
Leukocytes,Ua: NEGATIVE
Nitrite: NEGATIVE
Protein, ur: NEGATIVE mg/dL
Specific Gravity, Urine: 1.021 (ref 1.005–1.030)
Squamous Epithelial / HPF: NONE SEEN (ref 0–5)
pH: 7 (ref 5.0–8.0)

## 2020-06-08 LAB — CBC WITH DIFFERENTIAL/PLATELET
Abs Immature Granulocytes: 0.14 10*3/uL — ABNORMAL HIGH (ref 0.00–0.07)
Basophils Absolute: 0 10*3/uL (ref 0.0–0.1)
Basophils Relative: 1 %
Eosinophils Absolute: 0 10*3/uL (ref 0.0–0.5)
Eosinophils Relative: 1 %
HCT: 35.7 % — ABNORMAL LOW (ref 39.0–52.0)
Hemoglobin: 11.8 g/dL — ABNORMAL LOW (ref 13.0–17.0)
Immature Granulocytes: 2 %
Lymphocytes Relative: 27 %
Lymphs Abs: 1.7 10*3/uL (ref 0.7–4.0)
MCH: 31.9 pg (ref 26.0–34.0)
MCHC: 33.1 g/dL (ref 30.0–36.0)
MCV: 96.5 fL (ref 80.0–100.0)
Monocytes Absolute: 0.6 10*3/uL (ref 0.1–1.0)
Monocytes Relative: 9 %
Neutro Abs: 4 10*3/uL (ref 1.7–7.7)
Neutrophils Relative %: 60 %
Platelets: 375 10*3/uL (ref 150–400)
RBC: 3.7 MIL/uL — ABNORMAL LOW (ref 4.22–5.81)
RDW: 13.5 % (ref 11.5–15.5)
WBC: 6.5 10*3/uL (ref 4.0–10.5)
nRBC: 0 % (ref 0.0–0.2)

## 2020-06-08 LAB — COMPREHENSIVE METABOLIC PANEL
ALT: 19 U/L (ref 0–44)
AST: 29 U/L (ref 15–41)
Albumin: 3.3 g/dL — ABNORMAL LOW (ref 3.5–5.0)
Alkaline Phosphatase: 58 U/L (ref 38–126)
Anion gap: 11 (ref 5–15)
BUN: 37 mg/dL — ABNORMAL HIGH (ref 8–23)
CO2: 27 mmol/L (ref 22–32)
Calcium: 9.2 mg/dL (ref 8.9–10.3)
Chloride: 100 mmol/L (ref 98–111)
Creatinine, Ser: 1.32 mg/dL — ABNORMAL HIGH (ref 0.61–1.24)
GFR, Estimated: 55 mL/min — ABNORMAL LOW (ref >60)
Glucose, Bld: 183 mg/dL — ABNORMAL HIGH (ref 70–99)
Potassium: 5.2 mmol/L — ABNORMAL HIGH (ref 3.5–5.1)
Sodium: 138 mmol/L (ref 135–145)
Total Bilirubin: 0.8 mg/dL (ref 0.3–1.2)
Total Protein: 6.9 g/dL (ref 6.5–8.1)

## 2020-06-08 LAB — LACTIC ACID, PLASMA: Lactic Acid, Venous: 1.9 mmol/L (ref 0.5–1.9)

## 2020-06-08 LAB — TROPONIN I (HIGH SENSITIVITY): Troponin I (High Sensitivity): 6 ng/L (ref <18)

## 2020-06-08 LAB — BRAIN NATRIURETIC PEPTIDE: B Natriuretic Peptide: 87.7 pg/mL (ref 0.0–100.0)

## 2020-06-08 NOTE — ED Provider Notes (Signed)
[facility] Emergency Department Provider Note  ____________________________________________  Time seen: Approximately 3:27 PM  I have reviewed the triage vital signs and the nursing notes.   HISTORY  Chief Complaint No chief complaint on file.    HPI James Hood is a 80 y.o. male who presents the emergency department complaining of hypoxia and hypotension at home.  Patient was recently admitted to the hospital for sepsis and pneumonia.  Patient had improved and was not requiring any oxygen on discharge.  Patient states that his wife had bought a pulse ox at the recommendation of the PCP and he had been doing well until the past 2 days.  Patient states that he has had O2 sat at home as low as 84% but it has been hovering between 88 and 90%.  Patient denies any chest pain, feelings of shortness of breath.  He denies any cough, fevers, chills, nasal congestion, sore throat.  He was negative for COVID at time of admission.         Past Medical History:  Diagnosis Date  . Anxiety   . Anxiety and depression   . Arthritis   . BPH (benign prostatic hyperplasia)   . 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 Colon polyps   . Dementia ([place])   . Depression   . Diabetes mellitus type 2 in nonobese (HCC)   . GERD (gastroesophageal reflux disease)    BARRETTS ESOPHAGUS RESOLVED PER PATIENT  . Headache   . 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 History of hiatal hernia   . Hypercholesteremia   . Hypertension   . Hyperthyroidism   . Neuropathic pain   . Primary localized osteoarthritis of right knee   . 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 Stomach ulcer   . Tremor, essential   . Wound infection complicating hardware ([place]) 04/02/2018    Patient Active Problem List   Diagnosis Date Noted  . Prolonged Q-T interval on ECG 06/08/2020  . Severe sepsis ([place]) 06/01/2020  . Acute metabolic encephalopathy [id number]  . CAP  (community acquired pneumonia) 05/30/2020  . 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 Right leg pain 02/05/2020  . Impingement of left ankle joint   . Sleeping difficulty 08/24/2019  . Hiccups 08/24/2019  . Chronic pain of left ankle 02/27/2019  . Vitamin D deficiency 02/18/2019  . Left shoulder pain 02/02/2019  . Neuropathy 09/24/2018  . Lumbosacral radiculopathy 01/14/2018  . 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 Facet arthritis of lumbar region 01/14/2018  . Chronic pain syndrome 08/26/2017  . 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 Thyroid nodule 02/27/2017  . Fatty liver 02/27/2017  . Purpura ([place]) 01/29/2017  . Right hip pain 08/16/2016  . Lumbar herniated disc 02/03/2016  . Hypertriglyceridemia 12/09/2015  . Anemia 12/09/2015  . Falls 09/16/2015  . Barrett's esophagus 09/16/2015  . Chronic lumbar pain 09/16/2015  . Benign prostatic hyperplasia 09/16/2015  . Trochanteric bursitis of left hip 09/09/2015  . Headache 09/09/2015  . Difficulty in walking 06/01/2015  . Amnesia 06/01/2015  . HLD (hyperlipidemia) 02/23/2015  . DJD (degenerative joint disease) of knee 11/15/2014  . Hypertension   . Tremor, essential   . Primary localized osteoarthritis of right knee   . Anxiety and depression   . Absence of sensation 11/18/2013  . Tobacco abuse 11/18/2013  . Chronic diarrhea 11/12/2013  . Benign neoplasm of colon 08/25/2013  . 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 Type 2  diabetes mellitus ([place]) 10/06/2012    Past Surgical History:  Procedure Laterality Date  . ANKLE ARTHROSCOPY Left 09/29/2019   Procedure: LEFT ANKLE ARTHROSCOPY, DEBRIDEMENT;  Surgeon: Newt Minion, MD;  Location: [place];  Service: Orthopedics;  Laterality: Left;  . BACK SURGERY    . COLONOSCOPY WITH PROPOFOL N/A 09/14/2016   Procedure: COLONOSCOPY WITH PROPOFOL;  Surgeon: Manya Silvas, MD;  Location: [facility] ENDOSCOPY;  Service: Endoscopy;  Laterality: N/A;  . esophageal  stretch    . JOINT REPLACEMENT Right 2016   knee  . LUMBAR LAMINECTOMY/DECOMPRESSION MICRODISCECTOMY Left 02/03/2016   Procedure: LEFT L5-S1 DISKECTOMY;  Surgeon: Leeroy Cha, MD;  Location: [place] NEURO ORS;  Service: Neurosurgery;  Laterality: Left;  LEFT L5-S1 DISKECTOMY  . PULSE GENERATOR IMPLANT N/A 08/21/2017   Procedure: UNILATERAL PULSE GENERATOR IMPLANT;  Surgeon: Meade Maw, MD;  Location: [facility] ORS;  Service: Neurosurgery;  Laterality: N/A;  . PULSE GENERATOR IMPLANT Right 04/02/2018   Procedure: REMOVAL OF PULSE GENERATOR IMPLANT AND LEADS;  Surgeon: Meade Maw, MD;  Location: [facility] ORS;  Service: Neurosurgery;  Laterality: Right;  . TONSILLECTOMY    . TOTAL KNEE ARTHROPLASTY Right 11/15/2014   Procedure: TOTAL KNEE ARTHROPLASTY;  Surgeon: Elsie Saas, MD;  Location: [place];  Service: Orthopedics;  Laterality: Right;    Prior to Admission medications   Medication Sig Start Date End Date Taking? Authorizing Provider  aspirin EC 81 MG tablet Take 81 mg by mouth daily.    [provider]  budesonide (ENTOCORT EC) 3 MG 24 hr capsule Take 9 mg by mouth daily. 08/06/19   [provider]  buPROPion (WELLBUTRIN SR) 150 MG 12 hr tablet TAKE ONE TABLET BY MOUTH TWICE A DAY 04/28/20   Leone Haven, MD  Continuous Blood Gluc Receiver (FREESTYLE LIBRE 14 DAY READER) DEVI USE DAILY AS DIRECED TO CHECK BLOOD GLUCOSE 12/28/19   Leone Haven, MD  Continuous Blood Gluc Sensor (FREESTYLE LIBRE 14 DAY SENSOR) MISC APPLY ONE SENSOR EVERY 14 DAYS TO MONITOR BLOOD GLUCOSE LEVELS.  REMOVE OLD SENSOR BEFORE APPLYING NEW SENSOR 05/09/20   Leone Haven, MD  diphenoxylate-atropine (LOMOTIL) 2.5-0.025 MG tablet Take 1 tablet by mouth 4 (four) times daily as needed. 11/12/16   [provider]  divalproex (DEPAKOTE ER) 500 MG 24 hr tablet Take 1 tablet by mouth 2 (two) times daily. 03/20/18   [provider]  donepezil (ARICEPT) 10 MG tablet Take 10 mg  by mouth at bedtime.    [provider]  fenofibrate (TRICOR) 145 MG tablet TAKE ONE TABLET BY MOUTH DAILY 12/21/19   Leone Haven, MD  finasteride (PROSCAR) 5 MG tablet TAKE ONE TABLET BY MOUTH DAILY 04/21/20   Leone Haven, MD  gabapentin (NEURONTIN) 600 MG tablet TAKE TWO TABLETS BY MOUTH THREE TIMES A DAY 11/02/19   Leone Haven, MD  HYDROcodone-acetaminophen (NORCO) 10-325 MG tablet Take 1 tablet by mouth every 4 (four) hours as needed for severe pain. Must last 30 days. 05/30/20 06/29/20  [name], MD  HYDROcodone-acetaminophen (NORCO) 10-325 MG tablet Take 1 tablet by mouth every 4 (four) hours as needed for severe pain. Must last 30 days. 06/29/20 07/29/20  [name], MD  icosapent Ethyl (VASCEPA) 1 g capsule Take 2 g by mouth 2 (two) times daily.    [provider]  JARDIANCE 25 MG TABS tablet TAKE ONE TABLET BY MOUTH DAILY 05/26/20   Leone Haven, MD  lisinopril-hydrochlorothiazide (ZESTORETIC) 20-25 MG  tablet TAKE ONE TABLET BY MOUTH TWICE A DAY 01/04/20   Leone Haven, MD  memantine (NAMENDA) 10 MG tablet TAKE ONE TABLET BY MOUTH TWICE A DAY 06/30/19   [provider]  metoprolol succinate (TOPROL-XL) 25 MG 24 hr tablet Take 25 mg by mouth daily. 01/15/20   [provider]  Multiple Vitamins-Minerals (CENTRUM SILVER PO) Take 1 tablet by mouth daily.    [provider]  omeprazole (PRILOSEC) 20 MG capsule Take 20 mg by mouth 2 (two) times daily before a meal.  04/01/16   [provider]  primidone (MYSOLINE) 50 MG tablet Take 150-250 mg by mouth 2 (two) times daily. Take 250 mg by mouth in the morning & take 150 mg at night.    [provider]  QUEtiapine (SEROQUEL) 100 MG tablet Take 150 mg by mouth at bedtime.    [provider]  rosuvastatin (CRESTOR) 40 MG tablet TAKE ONE TABLET BY MOUTH DAILY 04/25/20   Leone Haven, MD  Semaglutide,0.25 or 0.5MG /DOS, (OZEMPIC, 0.25 OR 0.5 MG/DOSE,) 2  MG/1.5ML SOPN Inject 0.5 mg into the skin once a week. 04/05/20   Leone Haven, MD  tamsulosin (FLOMAX) 0.4 MG CAPS capsule TAKE ONE CAPSULE BY MOUTH DAILY AFTER SUPPER 04/18/20   Leone Haven, MD    Allergies Patient has no known allergies.  Family History  Problem Relation Age of Onset  . Heart attack Mother   . Heart disease Mother   . Hypertension Father   . Depression Father   . Kidney cancer Neg Hx   . Prostate cancer Neg Hx     Social History Social History   Tobacco Use  . Smoking status: Current Every Day Smoker    Packs/day: 0.50    Years: 60.00    Pack years: 30.00    Types: Cigarettes  . Smokeless tobacco: Never Used  Vaping Use  . Vaping Use: Some days  Substance Use Topics  . 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 Drug use: Never     Review of Systems  Constitutional: No fever/chills positive for hypoxia and hypotension at home. Eyes: No visual changes. No discharge ENT: No upper respiratory complaints. Cardiovascular: no chest pain. Respiratory: no cough. No SOB. Gastrointestinal: No abdominal pain.  No nausea, no vomiting.  No diarrhea.  No constipation. Genitourinary: Negative for dysuria. No hematuria Musculoskeletal: Negative for musculoskeletal pain. Skin: Negative for rash, abrasions, lacerations, ecchymosis. Neurological: Negative for headaches, focal weakness or numbness.  10 System ROS otherwise negative.  ____________________________________________   PHYSICAL EXAM:  VITAL SIGNS: ED Triage Vitals  Enc Vitals Group     BP 06/08/20 1449 97/64     Pulse Rate 06/08/20 1449 91     Resp 06/08/20 1449 18     Temp 06/08/20 1449 98.9 F (37.2 C)     Temp Source 06/08/20 1449 Oral     SpO2 06/08/20 1449 95 %     Weight 06/08/20 1449 170 lb (77.1 kg)     Height 06/08/20 1449 5\' 10"  (1.778 m)     Head Circumference --      Peak Flow --      Pain  Score 06/08/20 1455 0     Pain Loc --      Pain Edu? --      Excl. in [place]? --      Constitutional: Alert and oriented.  Well appearing and in no acute distress. Eyes: Conjunctivae are normal. PERRL. EOMI. Head: Atraumatic. ENT:      Ears:       Nose: No congestion/rhinnorhea.      Mouth/Throat: Mucous membranes are moist.  Neck: No stridor.   Hematological/Lymphatic/Immunilogical: No cervical lymphadenopathy.*= Cardiovascular: Normal rate, regular rhythm. Normal S1 and S2.  Good peripheral circulation. Respiratory: Normal respiratory effort without tachypnea or retractions. Lungs CTAB.  No appreciable rales, rhonchi, or wheezing.  Good air entry to the bases with no decreased or absent breath sounds. Gastrointestinal: Bowel sounds 4 quadrants. Soft and nontender to palpation. No guarding or rigidity. No palpable masses. No distention. No CVA tenderness. Musculoskeletal: Full range of motion to all extremities. No gross deformities appreciated. Neurologic:  Normal speech and language. No gross focal neurologic deficits are appreciated.  Skin:  Skin is warm, dry and intact. No rash noted. Psychiatric: Mood and affect are normal. Speech and behavior are normal. Patient exhibits appropriate insight and judgement.   ____________________________________________   LABS (all labs ordered are listed, but only abnormal results are displayed)  Labs Reviewed  COMPREHENSIVE METABOLIC PANEL - Abnormal; Notable for the following components:      Result Value   Potassium 5.2 (*)    Glucose, Bld 183 (*)    BUN 37 (*)    Creatinine, Ser 1.32 (*)    Albumin 3.3 (*)    GFR, Estimated 55 (*)    All other components within normal limits  CBC WITH DIFFERENTIAL/PLATELET - Abnormal; Notable for the following components:   RBC 3.70 (*)    Hemoglobin 11.8 (*)    HCT 35.7 (*)    Abs Immature Granulocytes 0.14 (*)    All other components within normal limits  URINALYSIS, COMPLETE (UACMP) WITH  MICROSCOPIC - Abnormal; Notable for the following components:   Color, Urine YELLOW (*)    APPearance CLEAR (*)    Glucose, UA >=500 (*)    All other components within normal limits  BRAIN NATRIURETIC PEPTIDE  LACTIC ACID, PLASMA  TROPONIN I (HIGH SENSITIVITY)   ____________________________________________  EKG   ____________________________________________  RADIOLOGY I personally viewed and evaluated these images as part of my medical decision making, as well as reviewing the written report by the radiologist.  ED Provider Interpretation: Right infiltrate consistent with previous imaging.  No other acute cardiopulmonary finding.  DG Chest 2 View  Result Date: 06/08/2020 CLINICAL DATA:  Shortness of breath, hypoxia recent pneumonia. EXAM: CHEST - 2 VIEW COMPARISON:  Chest radiograph and CTA chest May 30, 2020 FINDINGS: The heart size and mediastinal contours are unchanged. Similar right basilar airspace opacities. No new focal consolidation. No pleural effusion or pneumothorax. The visualized skeletal structures are unchanged. IMPRESSION: Similar right basilar airspace opacities, findings may represent atelectasis or infiltrate. Electronically Signed   By: Maudry MayhewJeffrey  Waltz MD   On: 06/08/2020 16:50    ____________________________________________    PROCEDURES  Procedure(s) performed:    Procedures    Medications - No data to display   ____________________________________________   INITIAL IMPRESSION / ASSESSMENT AND PLAN / ED COURSE  Pertinent labs & imaging results that were available during my care of the patient were reviewed by me and considered in my medical decision making (see chart for details).  Review of the [place] CSRS was performed in accordance of the [facility] prior to dispensing any controlled drugs.  Clinical Course as of 06/08/20 2127  Wed Jun 08, 2020  1722 Patient presented to the emergency  department complaining of hypoxia and hypotension at home.   Patient states that he was recently admitted to the hospital for pneumonia follow-up with his advised to purchase a pulse ox at home to monitor his O2 saturations at home.  Patient states that he did been doing well into the last 2 days.  Patient has been having hypoxia in the mid to lower 80s at rest.  Patient states that he is not feeling short of breath, having no coughing.  No fevers or chills.  On arrival, patient checked his O2 sat and it was 88% in the room.  Patient's O2 saturation started to climb, her pulse ox was applied and readings were similar on her pulse ox and his.  Patient was ambulated by myself at this time, his O2 saturations actually improved to 95 or 96% without oxygen.   [JC]    Clinical Course User Index [JC] Shiana Rappleye, Charline Bills, PA-C          Patient's diagnosis is consistent with hypoxia.  Patient presented to the emergency department complaining of hypoxia at rest.  Patient states that he has had some intermittent O2 saturation readings at home in the 80 percentiles.  Patient was recently diagnosed with sepsis from pneumonia and had been discharged from the hospital.  Patient states that he has had no cough, no fevers, no difficulty breathing but when he is at rest his oxygen saturation tends to fall.  Patient arrived in the ED afebrile, mildly hypotensive and otherwise stable.  Patient had reassuring lung sounds with no adventitious lung sounds.  Patient was ambulated and with ambulation his O2 saturation actually rose.  I feel that this is likely transient hypoxia due to decreased depth of respiration when the patient is sleeping or at rest.  Patient lowest O2 saturation here in the emergency department was 90% however majority of readings were between 93 and 97% here in the ED.  Patient has not required any oxygen at home.  He does have a 65-year smoking history but has never been diagnosed with COPD or emphysema.  Patient is stable at this time, work-up is reassuring.  At  this time I feel that patient is stable for discharge as he has not been hypoxic at any point here in the ED.  I do feel that patient would benefit from home oxygen and I have placed a social work consult as an outpatient to assist with at home oxygen.  I have given strict return precautions to the patient and his wife to return for any hypoxia that is not improved with increased respiration or activity.  They verbalized understanding of same..  Patient should follow-up with primary care.  Patient is given ED precautions to return to the ED for any worsening or new symptoms.     ____________________________________________  FINAL CLINICAL IMPRESSION(S) / ED DIAGNOSES  Final diagnoses:  Hypoxia      NEW MEDICATIONS STARTED DURING THIS VISIT:  ED Discharge Orders    None          This chart was dictated using voice recognition software/Dragon. Despite best efforts to proofread, errors can occur which can change the meaning. Any change was purely unintentional.    Darletta Moll, PA-C 06/08/20 2127    [name], MD 06/09/20 Laureen Abrahams

## 2020-06-08 NOTE — Clinical Social Work Note (Signed)
TOC consulted due to pt with transient hypoxia at rest and sleeping. Consult states pt with need of O2 at night. TOC unable to set pt up with O2 due to late hour and DME companies being closed, TOC would be able to assist more tomorrow. TOC found that pt has now discharged.

## 2020-06-08 NOTE — Discharge Instructions (Signed)
Social work should reach out to you to assist with obtaining oxygen for use at home.  If you have not heard from them within the next 2 days follow-up with your primary care who can also assist with obtaining oxygen for you.

## 2020-06-08 NOTE — ED Triage Notes (Signed)
Pt here with wife to been seen for a follow up visit because his doctor was unable to see him. Pt states that his pulse ox at home goes to 84% when he is asleep but when he is awake it is at 90%. Pt is in NAD in triage.

## 2020-06-08 NOTE — ED Notes (Signed)
Pt given urinal and informed urine specimen needed to sent to lab. Pt instructed to press call light when he is able to provide sample

## 2020-06-08 NOTE — Assessment & Plan Note (Signed)
Noted on EKG.  This could be related to his recent infection though he is on Seroquel which does have prolonged QT risk.  Given that this occurred in the setting of his recent illness we will try to get him into the office for an EKG once he passes protocols.  I will also send my note to his neurologist who prescribes the Seroquel to get their input.

## 2020-06-08 NOTE — ED Notes (Signed)
E signature pad not working. Pt educated on discharge instructions and verbalized understanding.  

## 2020-06-08 NOTE — ED Notes (Signed)
Pt's spouse called x2 per pt request with no response. Pt visibly frustrated and not wanting to wait much longer to be seen. Will make nurse aware.

## 2020-06-08 NOTE — Assessment & Plan Note (Addendum)
Patient with intermittent issues with oxygen desaturation.  This is concerning given his prior pneumonia though it seems as though his other symptoms have been improving progressively.  I advised that he needs to be evaluated in person and we attempted to set him up with the respiratory clinic though there were no appointments available until next week.  He will go to urgent care for further evaluation.  The patient will come into the office in 3 to 4 weeks for follow-up chest x-ray as well.

## 2020-06-08 NOTE — Progress Notes (Signed)
Virtual Visit via telephone Note  This visit type was conducted due to national recommendations for restrictions regarding the COVID-19 pandemic (e.g. social distancing).  This format is felt to be most appropriate for this patient at this time.  All issues noted in this document were discussed and addressed.  No physical exam was performed (except for noted visual exam findings with Video Visits).   I connected with James Hood today at 11:30 AM EST by telephone and verified that I am speaking with the correct person using two identifiers. Location patient: home Location provider: home office Persons participating in the virtual visit: patient, provider, Murlean IbaKristen Armetta (wife)  I discussed the limitations, risks, security and privacy concerns of performing an evaluation and management service by telephone and the availability of in person appointments. I also discussed with the patient that there may be a patient responsible charge related to this service. The patient expressed understanding and agreed to proceed.  Interactive audio and video telecommunications were attempted between this provider and patient, however failed, due to patient having technical difficulties OR patient did not have access to video capability.  We continued and completed visit with audio only.   Reason for visit: f/u  HPI: Community-acquired pneumonia: Patient was hospitalized for this earlier in January.  His wife notes that his oxygen has been intermittently dropping down into the mid 80s while he is at rest and sleeping.  When she gets him to take deep breaths she can get the oxygen back up into the mid 90s.  Currently his oxygen saturations 92%.  They deny shortness of breath.  She does note that he falls asleep at the drop of a hat.  His cough is improved.  He notes no congestion or fevers.  He did finish a 3-day course of Augmentin that was sent home with him from the hospital.  He was found to have a  prolonged QTC on EKG.  CT angiogram with no PE though did have findings of pneumonia.  He has already been set up with physical therapy.   ROS: See pertinent positives and negatives per HPI.  Past Medical History:  Diagnosis Date  . Anxiety   . Anxiety and depression   . Arthritis   . BPH (benign prostatic hyperplasia)   . 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 Colon polyps   . Dementia (HCC)   . Depression   . Diabetes mellitus type 2 in nonobese (HCC)   . GERD (gastroesophageal reflux disease)    BARRETTS ESOPHAGUS RESOLVED PER PATIENT  . Headache   . 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HCC)   . History of hiatal hernia   . Hypercholesteremia   . Hypertension   . Hyperthyroidism   . Neuropathic pain   . Primary localized osteoarthritis of right knee   . 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 Stomach ulcer   . Tremor, essential   . 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/02/2018    Past Surgical History:  Procedure Laterality Date  . ANKLE ARTHROSCOPY Left 09/29/2019   Procedure: LEFT ANKLE ARTHROSCOPY, DEBRIDEMENT;  Surgeon: Nadara Mustarduda, Marcus V, MD;  Location: [facility];  Service: Orthopedics;  Laterality: Left;  . BACK SURGERY    . COLONOSCOPY WITH PROPOFOL N/A 09/14/2016   Procedure: COLONOSCOPY WITH PROPOFOL;  Surgeon: Scot Junobert T Elliott, MD;  [facility];  Service: Endoscopy;  Laterality: N/A;  . esophageal stretch    . JOINT REPLACEMENT Right 2016  knee  . LUMBAR LAMINECTOMY/DECOMPRESSION MICRODISCECTOMY Left 02/03/2016   Procedure: LEFT L5-S1 DISKECTOMY;  Surgeon: Leeroy Cha, MD;  Location: [facility];  Service: Neurosurgery;  Laterality: Left;  LEFT L5-S1 DISKECTOMY  . PULSE GENERATOR IMPLANT N/A 08/21/2017   Procedure: UNILATERAL PULSE GENERATOR IMPLANT;  Surgeon: Meade Maw, MD;  Location: [facility];  Service: Neurosurgery;  Laterality: N/A;  . PULSE GENERATOR IMPLANT Right 04/02/2018    Procedure: REMOVAL OF PULSE GENERATOR IMPLANT AND LEADS;  Surgeon: Meade Maw, MD;  Location: [facility];  Service: Neurosurgery;  Laterality: Right;  . TONSILLECTOMY    . TOTAL KNEE ARTHROPLASTY Right 11/15/2014   Procedure: TOTAL KNEE ARTHROPLASTY;  Surgeon: Elsie Saas, MD;  Location: [facility];  Service: Orthopedics;  Laterality: Right;    Family History  Problem Relation Age of Onset  . Heart attack Mother   . Heart disease Mother   . Hypertension Father   . Depression Father   . Kidney cancer Neg Hx   . Prostate cancer Neg Hx     SOCIAL HX: Smoker   Current Outpatient Medications:  .  aspirin EC 81 MG tablet, Take 81 mg by mouth daily., Disp: , Rfl:  .  budesonide (ENTOCORT EC) 3 MG 24 hr capsule, Take 9 mg by mouth daily., Disp: , Rfl:  .  buPROPion (WELLBUTRIN SR) 150 MG 12 hr tablet, TAKE ONE TABLET BY MOUTH TWICE A DAY, Disp: 180 tablet, Rfl: 0 .  Continuous Blood Gluc Receiver (FREESTYLE LIBRE 14 DAY READER) DEVI, USE DAILY AS DIRECED TO CHECK BLOOD GLUCOSE, Disp: 1 each, Rfl: 0 .  Continuous Blood Gluc Sensor (FREESTYLE LIBRE 14 DAY SENSOR) MISC, APPLY ONE SENSOR EVERY 14 DAYS TO MONITOR BLOOD GLUCOSE LEVELS.  REMOVE OLD SENSOR BEFORE APPLYING NEW SENSOR, Disp: 1 each, Rfl: 4 .  diphenoxylate-atropine (LOMOTIL) 2.5-0.025 MG tablet, Take 1 tablet by mouth 4 (four) times daily as needed., Disp: , Rfl:  .  divalproex (DEPAKOTE ER) 500 MG 24 hr tablet, Take 1 tablet by mouth 2 (two) times daily., Disp: , Rfl:  .  donepezil (ARICEPT) 10 MG tablet, Take 10 mg by mouth at bedtime., Disp: , Rfl:  .  fenofibrate (TRICOR) 145 MG tablet, TAKE ONE TABLET BY MOUTH DAILY, Disp: 90 tablet, Rfl: 1 .  finasteride (PROSCAR) 5 MG tablet, TAKE ONE TABLET BY MOUTH DAILY, Disp: 90 tablet, Rfl: 0 .  gabapentin (NEURONTIN) 600 MG tablet, TAKE TWO TABLETS BY MOUTH THREE TIMES A DAY, Disp: 180 tablet, Rfl: 1 .  HYDROcodone-acetaminophen (NORCO) 10-325 MG tablet, Take 1 tablet by mouth every 4  (four) hours as needed for severe pain. Must last 30 days., Disp: 180 tablet, Rfl: 0 .  [START ON 06/29/2020] HYDROcodone-acetaminophen (NORCO) 10-325 MG tablet, Take 1 tablet by mouth every 4 (four) hours as needed for severe pain. Must last 30 days., Disp: 180 tablet, Rfl: 0 .  icosapent Ethyl (VASCEPA) 1 g capsule, Take 2 g by mouth 2 (two) times daily., Disp: , Rfl:  .  JARDIANCE 25 MG TABS tablet, TAKE ONE TABLET BY MOUTH DAILY, Disp: 30 tablet, Rfl: 5 .  lisinopril-hydrochlorothiazide (ZESTORETIC) 20-25 MG tablet, TAKE ONE TABLET BY MOUTH TWICE A DAY, Disp: 180 tablet, Rfl: 1 .  memantine (NAMENDA) 10 MG tablet, TAKE ONE TABLET BY MOUTH TWICE A DAY, Disp: , Rfl:  .  metoprolol succinate (TOPROL-XL) 25 MG 24 hr tablet, Take 25 mg by mouth daily., Disp: , Rfl:  .  Multiple Vitamins-Minerals (CENTRUM SILVER PO), Take 1  tablet by mouth daily., Disp: , Rfl:  .  omeprazole (PRILOSEC) 20 MG capsule, Take 20 mg by mouth 2 (two) times daily before a meal. , Disp: , Rfl:  .  primidone (MYSOLINE) 50 MG tablet, Take 150-250 mg by mouth 2 (two) times daily. Take 250 mg by mouth in the morning & take 150 mg at night., Disp: , Rfl:  .  QUEtiapine (SEROQUEL) 100 MG tablet, Take 150 mg by mouth at bedtime., Disp: , Rfl:  .  rosuvastatin (CRESTOR) 40 MG tablet, TAKE ONE TABLET BY MOUTH DAILY, Disp: 90 tablet, Rfl: 0 .  Semaglutide,0.25 or 0.5MG /DOS, (OZEMPIC, 0.25 OR 0.5 MG/DOSE,) 2 MG/1.5ML SOPN, Inject 0.5 mg into the skin once a week., Disp: 1.5 mL, Rfl: 3 .  tamsulosin (FLOMAX) 0.4 MG CAPS capsule, TAKE ONE CAPSULE BY MOUTH DAILY AFTER SUPPER, Disp: 90 capsule, Rfl: 0  EXAM: This was a telephone visit and thus no physical exam was completed.  ASSESSMENT AND PLAN:  Discussed the following assessment and plan:  Problem List Items Addressed This Visit    CAP (community acquired pneumonia)    Patient with intermittent issues with oxygen desaturation.  This is concerning given his prior pneumonia though it  seems as though his other symptoms have been improving progressively.  I advised that he needs to be evaluated in person and we attempted to set him up with the respiratory clinic though there were no appointments available until next week.  He will go to urgent care for further evaluation.  The patient will come into the office in 3 to 4 weeks for follow-up chest x-ray as well.      Relevant Orders   DG Chest 2 View   Prolonged Q-T interval on ECG    Noted on EKG.  This could be related to his recent infection though he is on Seroquel which does have prolonged QT risk.  Given that this occurred in the setting of his recent illness we will try to get him into the office for an EKG once he passes protocols.  I will also send my note to his neurologist who prescribes the Seroquel to get their input.          I discussed the assessment and treatment plan with the patient. The patient was provided an opportunity to ask questions and all were answered. The patient agreed with the plan and demonstrated an understanding of the instructions.   The patient was advised to call back or seek an in-person evaluation if the symptoms worsen or if the condition fails to improve as anticipated.  I provided 24 minutes of non-face-to-face time during this encounter.   Tommi Rumps, MD
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
## 2020-06-13 ENCOUNTER — Other Ambulatory Visit: Payer: Self-pay

## 2020-06-13 ENCOUNTER — Encounter: Payer: Self-pay | Admitting: Psychiatry

## 2020-06-13 ENCOUNTER — Telehealth (INDEPENDENT_AMBULATORY_CARE_PROVIDER_SITE_OTHER): Payer: PPO | Admitting: Psychiatry

## 2020-06-13 ENCOUNTER — Ambulatory Visit: Payer: Self-pay

## 2020-06-13 DIAGNOSIS — F330 Major depressive disorder, recurrent, mild: Secondary | ICD-10-CM

## 2020-06-13 MED ORDER — BUPROPION HCL ER (XL) 300 MG PO TB24: 300.0000 mg | ORAL_TABLET | Freq: Every day | ORAL | 1 refills | Status: DC

## 2020-06-13 NOTE — Patient Instructions (Signed)
1. Start bupropion XR 300 mg daily  2. Discontinue Bupropion 150 mg twice a day   3. Referral to therapy  4. Next appointment: 2/28 at 3 PM

## 2020-06-15 ENCOUNTER — Telehealth: Payer: Self-pay | Admitting: Pharmacist

## 2020-06-15 ENCOUNTER — Telehealth: Payer: PPO

## 2020-06-15 NOTE — Telephone Encounter (Signed)
  Chronic Care Management   Note  06/15/2020 Name: James Hood MRN: [id number] DOB: 1940/08/28   Attempted to contact patient for scheduled appointment for medication management support. Left HIPAA compliant message for patient to return my call at their convenience.    Plan: - If I do not hear back from the patient by end of business today, will collaborate with Care Guide to outreach to schedule follow up with me  Catie Darnelle Maffucci, PharmD, [place], [place] Pharmacist [employer] at [employer] [phone]

## 2020-06-21 ENCOUNTER — Ambulatory Visit: Payer: PPO | Attending: Family Medicine

## 2020-06-21 ENCOUNTER — Ambulatory Visit (INDEPENDENT_AMBULATORY_CARE_PROVIDER_SITE_OTHER): Payer: PPO | Admitting: Pharmacist

## 2020-06-21 ENCOUNTER — Other Ambulatory Visit: Payer: Self-pay

## 2020-06-21 DIAGNOSIS — E782 Mixed hyperlipidemia: Secondary | ICD-10-CM | POA: Diagnosis not present

## 2020-06-21 DIAGNOSIS — M6281 Muscle weakness (generalized): Secondary | ICD-10-CM | POA: Diagnosis not present

## 2020-06-21 DIAGNOSIS — N1831 Chronic kidney disease, stage 3a: Secondary | ICD-10-CM | POA: Diagnosis not present

## 2020-06-21 DIAGNOSIS — R296 Repeated falls: Secondary | ICD-10-CM

## 2020-06-21 DIAGNOSIS — I10 Essential (primary) hypertension: Secondary | ICD-10-CM | POA: Diagnosis not present

## 2020-06-21 DIAGNOSIS — F32A Depression, unspecified: Secondary | ICD-10-CM | POA: Diagnosis not present

## 2020-06-21 DIAGNOSIS — R4189 Other symptoms and signs involving cognitive functions and awareness: Secondary | ICD-10-CM

## 2020-06-21 DIAGNOSIS — E1142 Type 2 diabetes mellitus with diabetic polyneuropathy: Secondary | ICD-10-CM

## 2020-06-21 DIAGNOSIS — F419 Anxiety disorder, unspecified: Secondary | ICD-10-CM

## 2020-06-21 NOTE — Chronic Care Management (AMB) (Signed)
**Note James-Identified via Obfuscation** Chronic Care Management Pharmacy Note  06/21/2020 Name:  James Hood MRN:  [id number] DOB:  05-May-1941  Subjective: James Hood is an 80 y.o. year old male who is a primary patient of James Hood, James Adam, MD.  The CCM team was consulted for assistance with disease management and care coordination needs.    Engaged with patient by telephone for reponse to a call from him regarding higher glucose readings since hospital discharge in response to provider referral for pharmacy case management and/or care coordination services.   Consent to Services:  The patient was given information about Chronic Care Management services, agreed to services, and gave verbal consent prior to initiation of services.  Please see initial visit note for detailed documentation.   Objective:  Lab Results  Component Value Date   CREATININE 1.32 (H) 06/08/2020   CREATININE 0.96 06/01/2020   CREATININE 1.03 05/31/2020    Lab Results  Component Value Date   HGBA1C 6.7 (H) 05/09/2020       Component Value Date/Time   CHOL 120 05/25/2019 0828   TRIG 257.0 (H) 05/25/2019 0828   HDL 40.50 05/25/2019 0828   CHOLHDL 3 05/25/2019 0828   VLDL 51.4 (H) 05/25/2019 0828   LDLCALC 57 09/25/2017 0924   LDLDIRECT 46.0 05/25/2019 0828     BP Readings from Last 3 Encounters:  06/08/20 129/75  06/01/20 136/66  05/26/20 112/75    Assessment: Review of patient past medical history, allergies, medications, health status, including review of consultants reports, laboratory and other test data, was performed as part of comprehensive evaluation and provision of chronic care management services.   SDOH:  (Social Determinants of Health) assessments and interventions performed:  SDOH Interventions   Flowsheet Row Most Recent Value  SDOH Interventions   SDOH Interventions for the Following Domains Tobacco  Financial Strain Interventions Other (Comment)  [manufacturer assistance]  Tobacco Interventions  Cessation Materials Given and Reviewed      CCM Care Plan  No Known Allergies  Medications Reviewed Today    Reviewed by James Hood, RPH-CPP (Pharmacist) on 06/21/20 at 1502  Med List Status: <None>  Medication Order Taking? Sig Documenting Provider Last Dose Status Informant  aspirin EC 81 MG tablet [id number] Yes Take 81 mg by mouth daily. [provider] Taking Active            Med Note James Hood   Tue Apr 15, 2018  8:47 AM)    budesonide (ENTOCORT EC) 3 MG 24 hr capsule [id number] Yes Take 9 mg by mouth daily. [provider] Taking Active   buPROPion (WELLBUTRIN XL) 300 MG 24 hr tablet [id number] Yes Take 1 tablet (300 mg total) by mouth daily. James Clay, MD Taking Active   Continuous Blood Gluc Receiver (FREESTYLE LIBRE 14 DAY READER) [place] [id number] Yes USE DAILY AS DIRECED TO CHECK BLOOD GLUCOSE James Haven, MD Taking Active   Continuous Blood Gluc Sensor (FREESTYLE LIBRE 14 DAY SENSOR) MISC [id number] Yes APPLY ONE SENSOR EVERY 14 DAYS TO MONITOR BLOOD GLUCOSE LEVELS.  [place] James Haven, MD Taking Active   diphenoxylate-atropine (LOMOTIL) 2.5-0.025 MG tablet [id number]  Take 1 tablet by mouth 4 (four) times daily as needed. [provider]  Active Pharmacy Records  divalproex (DEPAKOTE ER) 500 MG 24 hr tablet [id number] Yes Take 1 tablet by mouth 2 (two) times daily. [provider] Taking Active   donepezil (ARICEPT) 10 MG tablet [id number] Yes Take 10  mg by mouth at bedtime. [provider] Taking Active Self  fenofibrate (TRICOR) 145 MG tablet [id number] Yes TAKE ONE TABLET BY MOUTH DAILY James Haven, MD Taking Active   finasteride (PROSCAR) 5 MG tablet [id number] Yes TAKE ONE TABLET BY MOUTH DAILY James Haven, MD Taking Active   gabapentin (NEURONTIN) 600 MG tablet [id number] Yes TAKE TWO TABLETS BY MOUTH THREE TIMES A DAY James Haven, MD Taking Active    HYDROcodone-acetaminophen [facility]) 10-325 MG tablet [id number] Yes Take 1 tablet by mouth every 4 (four) hours as needed for severe pain. Must last 30 days. James Santa, MD Taking Active   HYDROcodone-acetaminophen [facility]) 10-325 MG tablet [id number]  Take 1 tablet by mouth every 4 (four) hours as needed for severe pain. Must last 30 days. James Santa, MD  Active   icosapent Ethyl (VASCEPA) 1 g capsule [id number] Yes Take 2 g by mouth 2 (two) times daily. [provider] Taking Active Pharmacy Records  JARDIANCE 25 MG TABS tablet [id number] Yes TAKE ONE TABLET BY MOUTH DAILY James Haven, MD Taking Active   lisinopril-hydrochlorothiazide (ZESTORETIC) 20-25 MG tablet [id number] Yes TAKE ONE TABLET BY MOUTH TWICE A DAY James Haven, MD Taking Active Self  memantine (NAMENDA) 10 MG tablet [id number] Yes TAKE ONE TABLET BY MOUTH TWICE A DAY [provider] Taking Active   metoprolol succinate (TOPROL-XL) 25 MG 24 hr tablet [id number] Yes Take 25 mg by mouth daily. [provider] Taking Active   Multiple Vitamins-Minerals (CENTRUM SILVER PO) [id number] Yes Take 1 tablet by mouth daily. [provider] Taking Active   omeprazole (PRILOSEC) 20 MG capsule [id number] Yes Take 20 mg by mouth 2 (two) times daily before a meal.  [provider] Taking Active Self           Med Note James Hood, James Hood   Thu Aug 09, 2016 12:05 PM)    primidone (MYSOLINE) 50 MG tablet [id number] Yes Take 150-250 mg by mouth 2 (two) times daily. Take 250 mg by mouth in the morning & take 150 mg at night. [provider] Taking Active Self  QUEtiapine (SEROQUEL) 100 MG tablet [id number] Yes Take 150 mg by mouth at bedtime. [provider] Taking Active Pharmacy Records  rosuvastatin (CRESTOR) 40 MG tablet [id number] Yes TAKE ONE TABLET BY MOUTH DAILY James Haven, MD Taking Active   Semaglutide,0.25 or 0.5MG /DOS, (OZEMPIC, 0.25 OR 0.5 MG/DOSE,) 2 MG/1.5ML SOPN  [id number] Yes Inject 0.5 mg into the skin once a week. James Haven, MD Taking Active Pharmacy Records  tamsulosin [facility]) 0.4 MG CAPS capsule [id number] Yes TAKE ONE CAPSULE BY MOUTH DAILY AFTER SUPPER James Haven, MD Taking Active   Med List Note [name], RN 05/26/20 1109): MR 3/11-22 UDS 10-06-2019          Patient Active Problem List   Diagnosis Date Noted  . Prolonged Q-T interval on ECG 06/08/2020  . Severe sepsis ([place]) 06/01/2020  . Acute metabolic encephalopathy 34/19/6222  . CAP (community acquired pneumonia) 05/30/2020  . 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 Right leg pain 02/05/2020  . Impingement of left ankle joint   . Sleeping difficulty 08/24/2019  . Hiccups 08/24/2019  . Chronic pain of left ankle 02/27/2019  . Vitamin D deficiency 02/18/2019  . Left shoulder pain 02/02/2019  . Neuropathy 09/24/2018  . Lumbosacral radiculopathy 01/14/2018  . 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 Facet arthritis of lumbar region 01/14/2018  . Chronic pain syndrome 08/26/2017  .  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 Thyroid nodule 02/27/2017  . Fatty liver 02/27/2017  . Purpura ([place]) 01/29/2017  . Right hip pain 08/16/2016  . Lumbar herniated disc 02/03/2016  . Hypertriglyceridemia 12/09/2015  . Anemia 12/09/2015  . Falls 09/16/2015  . Barrett's esophagus 09/16/2015  . Chronic lumbar pain 09/16/2015  . Benign prostatic hyperplasia 09/16/2015  . Trochanteric bursitis of left hip 09/09/2015  . Headache 09/09/2015  . Difficulty in walking 06/01/2015  . Amnesia 06/01/2015  . HLD (hyperlipidemia) 02/23/2015  . DJD (degenerative joint disease) of knee 11/15/2014  . Hypertension   . Tremor, essential   . Primary localized osteoarthritis of right knee   . Anxiety and depression   . Absence of sensation 11/18/2013  . Tobacco abuse 11/18/2013  . Chronic diarrhea 11/12/2013  . Benign neoplasm of colon 08/25/2013  . 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 Type 2 diabetes mellitus ([place]) 10/06/2012    Conditions to be addressed/monitored: HTN, HLD, DMII, Bipolar Disorder and tobacco use  Care Plan : Medication Management  Updates made by James Hood, RPH-CPP since 06/21/2020 12:00 AM    Problem: Diabetes, Depression     Long-Range Goal: Disease Progression Prevention   Start Date: 02/16/2020  Recent Progress: On track  Priority: Medium  Note:   Current Barriers:  . Unable to independently afford treatment regimen  Pharmacist Clinical Goal(s):  Marland Kitchen Over the next 30 days, patient will verbalize ability to afford treatment regimen through collaboration with PharmD and provider.  . Over the next 90 days, patient will maintain glycemic control as evidenced by A1c through collaboration with PharmD and provider.  Interventions: . 1:1 collaboration with James Haven, MD regarding development and update of comprehensive plan of care as evidenced by provider attestation and co-signature . Inter-disciplinary care team collaboration (see longitudinal plan of care) . Comprehensive medication review performed; medication list updated in electronic medical record  Health Maintenance: . Recent hospitalization for pneumonia. Patient notes he is feeling much better at time. Denies any continued SOB.   Diabetes: . Controlled; current treatment: Jardiance 25 mg daily, Ozempic 0.5 mg weekly (missed a week of Ozempic while in the hospital; max tolerated dose d/t GI upset/weight loss at 1 mg weekly) o Hx metformin ER - significant diarrhea that resolved upon discontinuation . Over income for [place] assistance, though approved for Ozempic assistance for 2022 . Current glucose readings per review on 06/21/20 Average Glucose for 7 days: 165; 14 days 166 Time in Goal:  - Time in range 70-180: 70% - Time above range: 30% - Time below range: 0%  . Reviewed that missing a dose of Ozempic, physical and mental stress of hospitalization  and illness, infection could contribute to elevation in sugars. Prior to adding additional pharmacotherapy, will allow longer period of time for patient to get back into a normal routine.  . Moving forward, will determine if additional therapy is needed based on if glucose elevations are fasting/throughout the day (would recommend basal insulin) vs post prandial (would recommend glipizide vs tighter focus on dietary choices) . Patient aware they need to pick up [place] PAP supply of Ozempic at their earliest convenience.  Hx tobacco use: Marland Kitchen Patient reports he quit smoking after hospitalization - quit date 06/01/20 . Notes that he finally had the motivation to quit smoking after hospitalization for pneumonia that he knows was exacerbated by tobacco use.  . Notes occasional cravings. Has some cigarette butts that he will take 1-2 drags on.  Marland Kitchen  Advised to avoid continued use of cigarette butts. Discussed that his most common time for cravings is after meals. Brainstormed ways to keep him active after meals to prevent focus on cravings.  . Suggested nicotine gum/lozenges. Patient notes that he does not like chewing gum or lozenges, both make him more nervous. Encouraged to continue to avoid tobacco. Consider varenicline if patient continues to have cravings or restarts smoking.   Hypertension: . Controlled per last clinic reading; current treatment: lisinopril/HCTZ 20/25 mg daily, metoprolol succinate 25 mg daily . Recommended to continue current regimen  Hyperlipidemia: . Controlled; current treatment: rosuvastatin 40 mg daily, fenofibrate 145 mg daily, Vascepa 2 g BID;  o [place] for [employer] . Recommended to continue current regimen. . Recommend updated lipid panel at next lab work  Depression: . Recent referral to psychiatry. Current regimen: bupropion XL 300 mg daily. Established w/ Dr. Modesta Messing . Bupropion may be providing positive support to patient staying tobacco  free. Continue current regimen along with psychiatry collaboration at this time.   Chronic Pain w/ recent ankle surgery: . Improved per patient report; oxycodone/APAP 5/325 mg up to TID, gabapenin 1200 mg TID. Follows w/ Dr. Holley Raring . Recommend to continue collaboration w/ Pain Management  Neurologic Conditions (pseudo dementia, tremor, sleep disorder) . Well managed per patient report; current regimen: memantine 10 mg BID, donepezil 10 mg daily; primidone 250 mg QAM, 150 mg QPM; divalproex 500 mg BID, quetiapine 150 mg QPM; Follows w/ Dr. Melrose Nakayama. Quetiapine increased at last visit.  Marland Kitchen Recommended to continue current regimen at this time along with collaboration w/ Neurology  Chronic Diarrhea/Colitis: . Controlled per patient report; current regimen: budesonide 9 mg daily, Lomotil 2 tabs up to QID PRN; follows w/ [facility] . Recommended to continue current regimen at this time along with collaboration w/ GI. GLP1 therapy is not exacerbating baseline disease  Patient Goals/Self-Care Activities . Over the next 30 days, patient will:  - take medications as prescribed collaborate with provider on medication access solutions Remain tobacco free  Follow Up Plan: Telephone follow up appointment with care management team member scheduled for: ~ 4 weeks     Medication Assistance: Ozempic obtained through [employer] medication assistance program.  Enrollment ends 04/19/21  Follow Up:  Patient agrees to Care Plan and Follow-up.  Plan: Telephone follow up appointment with care management team member scheduled for:  ~ 4 weeks  Catie Darnelle Maffucci, PharmD, [place], [place] Clinical Pharmacist [employer] at [employer] [phone]

## 2020-06-21 NOTE — Patient Instructions (Signed)
Visit Information  PATIENT GOALS: Goals Addressed              This Visit's Progress     Patient Stated   .  Medication Monitoring (pt-stated)         Patient Goals/Self-Care Activities . Over the next 30 days, patient will:  - take medications as prescribed collaborate with provider on medication access solutions Remain tobacco free       The patient verbalized understanding of instructions, educational materials, and care plan provided today and declined offer to receive copy of patient instructions, educational materials, and care plan.   Plan: Telephone follow up appointment with care management team member scheduled for:  ~ 4 weeks  Catie Darnelle Maffucci, PharmD, [place], [place] Clinical Pharmacist [employer] at [employer] [phone]

## 2020-06-22 NOTE — Therapy (Signed)
[facility] [address] [place], [address] Phone: [phone]   Fax:  [phone]  Physical Therapy Evaluation  Patient Details  Name: James Hood MRN: [id number] Date of Birth: 07-11-1940 Referring Provider (PT): Caryl Bis MD   Encounter Date: 06/21/2020   PT End of Session - 06/21/20 1353    Visit Number 1    Number of Visits 13    Date for PT Re-Evaluation 08/02/20    Authorization Type 1/10    Authorization Time Period `    PT Start Time 1115    PT Stop Time 1200    PT Time Calculation (min) 45 min    Equipment Utilized During Treatment Gait belt    Activity Tolerance Patient tolerated treatment well           Past Medical History:  Diagnosis Date  . Anxiety   . Anxiety and depression   . Arthritis   . BPH (benign prostatic hyperplasia)   . 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 Colon polyps   . Dementia ([place])   . Depression   . Diabetes mellitus type 2 in nonobese (HCC)   . GERD (gastroesophageal reflux disease)    BARRETTS ESOPHAGUS RESOLVED PER PATIENT  . Headache   . 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[place])   . History of hiatal hernia   . Hypercholesteremia   . Hypertension   . Hyperthyroidism   . Neuropathic pain   . Primary localized osteoarthritis of right knee   . 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 Stomach ulcer   . Tremor, essential   . Wound infection complicating hardware ([place]) 04/02/2018    Past Surgical History:  Procedure Laterality Date  . ANKLE ARTHROSCOPY Left 09/29/2019   Procedure: LEFT ANKLE ARTHROSCOPY, DEBRIDEMENT;  Surgeon: Newt Minion, MD;  Location: [facility];  Service: Orthopedics;  Laterality: Left;  . BACK SURGERY    . COLONOSCOPY WITH PROPOFOL N/A 09/14/2016   Procedure: COLONOSCOPY WITH PROPOFOL;  Surgeon: Manya Silvas, MD;  Location: [facility] ENDOSCOPY;  Service: Endoscopy;  Laterality: N/A;   . esophageal stretch    . JOINT REPLACEMENT Right 2016   knee  . LUMBAR LAMINECTOMY/DECOMPRESSION MICRODISCECTOMY Left 02/03/2016   Procedure: LEFT L5-S1 DISKECTOMY;  Surgeon: Leeroy Cha, MD;  Location: [facility] NEURO ORS;  Service: Neurosurgery;  Laterality: Left;  LEFT L5-S1 DISKECTOMY  . PULSE GENERATOR IMPLANT N/A 08/21/2017   Procedure: UNILATERAL PULSE GENERATOR IMPLANT;  Surgeon: Meade Maw, MD;  Location: [facility] ORS;  Service: Neurosurgery;  Laterality: N/A;  . PULSE GENERATOR IMPLANT Right 04/02/2018   Procedure: REMOVAL OF PULSE GENERATOR IMPLANT AND LEADS;  Surgeon: Meade Maw, MD;  Location: [facility] ORS;  Service: Neurosurgery;  Laterality: Right;  . TONSILLECTOMY    . TOTAL KNEE ARTHROPLASTY Right 11/15/2014   Procedure: TOTAL KNEE ARTHROPLASTY;  Surgeon: Elsie Saas, MD;  Location: [facility];  Service: Orthopedics;  Laterality: Right;    There were no vitals filed for this visit.    Subjective Assessment - 06/21/20 1335    Subjective Patient is a 80 yo male presenting with increase balanace difficulties and high frequency in falls from idiopathetic onset over the past several years. Patient states increased difficulties with performing transfers from sitting to standing, walking, standing, and ascending/desending the stairs. Patient reports 6 falls over the past 6 months. Patient states he had difficulty with performing exercises secondary to his increased ankle and back  pain with walking for prolonged periodsa of time. Patient states he can stand ~ 5 min but can't walk one block like he had been able to in the past. Patient states he continues to have increased lbp and l ankle pain with continues to inhibit functional capacity.    Pertinent History L ankle arthroscopy, Chronic LBP    Limitations Walking    Patient Stated Goals To improve balance decrease falls    Currently in Pain? No/denies    Pain Score 6     Pain Location Ankle   back   Pain Orientation Left    Pain  Descriptors / Indicators Aching    Pain Type Chronic pain    Pain Onset More than a month ago    Pain Frequency Intermittent              [facility] PT Assessment - 06/22/20 0001      Assessment   Medical Diagnosis Requent falls    Referring Provider (PT) sonnenberg MD    Onset Date/Surgical Date 05/21/13    Hand Dominance Right    Next MD Visit unknown    Prior Therapy yes      Balance Screen   Has the patient fallen in the past 6 months Yes    How many times? 6    Has the patient had a decrease in activity level because of a fear of falling?  No    Is the patient reluctant to leave their home because of a fear of falling?  No      Home Ecologist residence    Living Arrangements Spouse/significant other      Prior Function   Level of Independence Independent    Vocation Retired    Biomedical scientist N/A    Leisure TV      Cognition   Overall Cognitive Status Within Functional Limits for tasks assessed      Observation/Other Assessments   Focus on Therapeutic Outcomes (FOTO)  47      Sensation   Light Touch Appears Intact      Functional Tests   Functional tests Sit to Stand      Sit to Stand   Comments Increased time to perform, use of hands without prompted      Posture/Postural Control   Posture Comments forward flexed in standing      ROM / Strength   AROM / PROM / Strength AROM;Strength      AROM   AROM Assessment Site Hip;Lumbar    Right/Left Hip Right;Left    Right Hip Extension -10    Right Hip Internal Rotation  20    Left Hip Extension -10    Left Hip Internal Rotation  20    Lumbar Flexion WNL    Lumbar Extension 50%      Strength   Strength Assessment Site Knee;Hip;Lumbar    Right/Left Hip Right;Left    Right Hip Flexion 4+/5    Right Hip Extension 4-/5    Right Hip External Rotation  4-/5    Right Hip Internal Rotation 4-/5    Right Hip ABduction 3+/5    Left Hip Flexion 4+/5    Left Hip Extension  3+/5    Left Hip External Rotation 4-/5    Left Hip Internal Rotation 4-/5    Left Hip ABduction 3+/5    Left Hip ADduction 3+/5    Lumbar Flexion 5/5    Lumbar Extension 4-/5  Transfers   Five time sit to stand comments  17sec      Ambulation/Gait   Assistive device --   SPC   Gait Comments Hip ER, forward flexion of lumbar, hip extension limited B      Standardized Balance Assessment   Standardized Balance Assessment Timed Up and Go Test;10 meter walk test;Dynamic Gait Index    10 Meter Walk .9 m/s      Dynamic Gait Index   Level Surface Mild Impairment    Change in Gait Speed Mild Impairment    Gait with Horizontal Head Turns Mild Impairment    Gait with Vertical Head Turns Mild Impairment    Gait and Pivot Turn Mild Impairment    Step Over Obstacle Mild Impairment    Step Around Obstacles Mild Impairment    Steps Mild Impairment    Total Score 16    DGI comment: use of cane entire time, increased difficulties with stairs      Timed Up and Go Test   Normal TUG (seconds) 12               Objective measurements completed on examination: See above findings.    TREATMENT Therapeutic Exercise Sit to stands -- x 10 instructed on technique to be performed at home Rhomberg stance -- 1 min   Performed exercises to address strength and balance limitations          PT Education - 06/21/20 1352    Education Details HEP: sit to stand, standing Rhomberg; POC; form/technique with exercise    Person(s) Educated Patient    Methods Explanation;Demonstration;Handout;Tactile cues;Verbal cues    Comprehension Verbalized understanding;Returned demonstration            PT Short Term Goals - 06/22/20 0754      PT SHORT TERM GOAL #1   Title Patient will require moderate cueing for performance of HEP to continue benefits of therapy at home    Baseline dependent    Time 6    Period Weeks    Status New    Target Date 08/02/20             PT Long Term  Goals - 06/22/20 0755      PT LONG TERM GOAL #1   Title Patient will improve DGI to >21 to indicate significant improvement in fall risk    Time 6    Period Weeks    Status New    Target Date 08/02/20      PT LONG TERM GOAL #2   Title pt will improve gait speed to 1.84m/s with LRAD to improve community mobility     Baseline .9 m/s    Time 6    Period Weeks    Status New      PT LONG TERM GOAL #3   Title pt will perform 5x sit to stand in <12s to indicate a reduced fall risk.    Baseline 17    Time 6    Period Weeks    Status New    Target Date 08/02/20      PT LONG TERM GOAL #4   Title Patient will demonstrate a significant improvement to 53 in his FOTO score to indicate significant improvement    Baseline 47    Time 6    Period Weeks    Status New    Target Date 08/02/20  Plan - 06/22/20 0749    Clinical Impression Statement Patient is a 80 yo right hand dominant male presenting to PT with increased difficulty with balancing and history of frequent falls. This is indicated by decreased scores with 5xSTS, decreased SLS/tandem stance, decreased gait speed, and a diminished DGI. Patient also demonstrates significant decrease in LE/hip weakness as indicated by decreased MMT scores noted throughout the LEs. Patient will benefit from further skilled therapy focused on improving these limitations to return to prior level of function.    Personal Factors and Comorbidities Comorbidity 3+    Comorbidities Chronic LBP and ankle pain, Diabetes, frequent falls    Examination-Activity Limitations Squat;Lift;Stairs;Locomotion Level;Stand    Examination-Participation Restrictions Community Activity    Stability/Clinical Decision Making Stable/Uncomplicated    Clinical Decision Making Moderate    Rehab Potential Fair    PT Frequency 2x / week    PT Duration 6 weeks    PT Treatment/Interventions Electrical Stimulation;Moist Heat;Stair training;Therapeutic  activities;Therapeutic exercise;Neuromuscular re-education;Manual techniques;Dry needling;Passive range of motion;Patient/family education;Joint Manipulations;Spinal Manipulations    PT Next Visit Plan Address Hip strengthening    PT Home Exercise Plan See education section    Consulted and Agree with Plan of Care Patient           Patient will benefit from skilled therapeutic intervention in order to improve the following deficits and impairments:  Pain,Abnormal gait,Impaired sensation,Decreased mobility,Increased muscle spasms,Postural dysfunction,Decreased strength,Decreased endurance,Decreased activity tolerance,Decreased balance,Difficulty walking  Visit Diagnosis: Muscle weakness (generalized)  Repeated falls     Problem List Patient Active Problem List   Diagnosis Date Noted  . Prolonged Q-T interval on ECG 06/08/2020  . Severe sepsis ([place]) 06/01/2020  . Acute metabolic encephalopathy [id number]  . CAP (community acquired pneumonia) 05/30/2020  . 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 Right leg pain 02/05/2020  . Impingement of left ankle joint   . Sleeping difficulty 08/24/2019  . Hiccups 08/24/2019  . Chronic pain of left ankle 02/27/2019  . Vitamin D deficiency 02/18/2019  . Left shoulder pain 02/02/2019  . Neuropathy 09/24/2018  . Lumbosacral radiculopathy 01/14/2018  . 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 Facet arthritis of lumbar region 01/14/2018  . Chronic pain syndrome 08/26/2017  . 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 Thyroid nodule 02/27/2017  . Fatty liver 02/27/2017  . Purpura ([place]) 01/29/2017  . Right hip pain 08/16/2016  . Lumbar herniated disc 02/03/2016  . Hypertriglyceridemia 12/09/2015  . Anemia 12/09/2015  . Falls 09/16/2015  . Barrett's esophagus 09/16/2015  . Chronic lumbar pain 09/16/2015  . Benign prostatic hyperplasia 09/16/2015  . Trochanteric bursitis of left hip 09/09/2015  . Headache 09/09/2015  . Difficulty in  walking 06/01/2015  . Amnesia 06/01/2015  . HLD (hyperlipidemia) 02/23/2015  . DJD (degenerative joint disease) of knee 11/15/2014  . Hypertension   . Tremor, essential   . Primary localized osteoarthritis of right knee   . Anxiety and depression   . Absence of sensation 11/18/2013  . Tobacco abuse 11/18/2013  . Chronic diarrhea 11/12/2013  . Benign neoplasm of colon 08/25/2013  . 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 Type 2 diabetes mellitus ([place]) 10/06/2012    Blythe Stanford, PT DPT 06/22/2020, 9:43 AM  [facility] [address] [place], [address] Phone: [phone]   Fax:  [phone]  Name: James Hood MRN: [id number] Date of Birth: 1940-07-18

## 2020-06-23 ENCOUNTER — Ambulatory Visit: Payer: PPO

## 2020-06-23 ENCOUNTER — Other Ambulatory Visit: Payer: Self-pay

## 2020-06-23 DIAGNOSIS — R296 Repeated falls: Secondary | ICD-10-CM

## 2020-06-23 DIAGNOSIS — M6281 Muscle weakness (generalized): Secondary | ICD-10-CM | POA: Diagnosis not present

## 2020-06-23 NOTE — Therapy (Signed)
[facility] [address] [place], [address] Phone: [phone]   Fax:  [phone]  Physical Therapy Treatment  Patient Details  Name: James Hood MRN: [id number] Date of Birth: 1941/03/05 Referring Provider (PT): Caryl Bis MD   Encounter Date: 06/23/2020   PT End of Session - 06/23/20 1700    Visit Number 2    Number of Visits 13    Date for PT Re-Evaluation 08/02/20    Authorization Type 2/10    Authorization Time Period `    PT Start Time 1605    PT Stop Time 1645    PT Time Calculation (min) 40 min    Equipment Utilized During Treatment Gait belt    Activity Tolerance Patient tolerated treatment well           Past Medical History:  Diagnosis Date  . Anxiety   . Anxiety and depression   . Arthritis   . BPH (benign prostatic hyperplasia)   . 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 Colon polyps   . Dementia ([place])   . Depression   . Diabetes mellitus type 2 in nonobese (HCC)   . GERD (gastroesophageal reflux disease)    BARRETTS ESOPHAGUS RESOLVED PER PATIENT  . Headache   . 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[place])   . History of hiatal hernia   . Hypercholesteremia   . Hypertension   . Hyperthyroidism   . Neuropathic pain   . Primary localized osteoarthritis of right knee   . 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 Stomach ulcer   . Tremor, essential   . Wound infection complicating hardware ([place]) 04/02/2018    Past Surgical History:  Procedure Laterality Date  . ANKLE ARTHROSCOPY Left 09/29/2019   Procedure: LEFT ANKLE ARTHROSCOPY, DEBRIDEMENT;  Surgeon: Newt Minion, MD;  Location: [facility];  Service: Orthopedics;  Laterality: Left;  . BACK SURGERY    . COLONOSCOPY WITH PROPOFOL N/A 09/14/2016   Procedure: COLONOSCOPY WITH PROPOFOL;  Surgeon: Manya Silvas, MD;  Location: [facility] ENDOSCOPY;  Service: Endoscopy;  Laterality: N/A;   . esophageal stretch    . JOINT REPLACEMENT Right 2016   knee  . LUMBAR LAMINECTOMY/DECOMPRESSION MICRODISCECTOMY Left 02/03/2016   Procedure: LEFT L5-S1 DISKECTOMY;  Surgeon: Leeroy Cha, MD;  Location: [facility] NEURO ORS;  Service: Neurosurgery;  Laterality: Left;  LEFT L5-S1 DISKECTOMY  . PULSE GENERATOR IMPLANT N/A 08/21/2017   Procedure: UNILATERAL PULSE GENERATOR IMPLANT;  Surgeon: Meade Maw, MD;  Location: [facility] ORS;  Service: Neurosurgery;  Laterality: N/A;  . PULSE GENERATOR IMPLANT Right 04/02/2018   Procedure: REMOVAL OF PULSE GENERATOR IMPLANT AND LEADS;  Surgeon: Meade Maw, MD;  Location: [facility] ORS;  Service: Neurosurgery;  Laterality: Right;  . TONSILLECTOMY    . TOTAL KNEE ARTHROPLASTY Right 11/15/2014   Procedure: TOTAL KNEE ARTHROPLASTY;  Surgeon: Elsie Saas, MD;  Location: [facility];  Service: Orthopedics;  Laterality: Right;    There were no vitals filed for this visit.   Subjective Assessment - 06/23/20 1609    Subjective Patient does not compain of any increase in pain from HEP. He did say that he is a little sore, but has been doing his exercises every day.    Pertinent History L ankle arthroscopy, Chronic LBP    Limitations Walking    Patient Stated Goals To improve balance decrease falls    Currently in Pain? No/denies    Pain Onset  More than a month ago            Therapeutic Exercise  Sit to stand from 17" chair- 2x10 Tandem stance- 30s each leg Stair walking without UE support- x4 Step up on 6in step and airex- 2x8 bilateral Lateral stepping- 2x10 YTB Standing march- 2x10 bilateral- 2 finger support Standing hip extension- 1x8 bilateral  Therapeutic exercise done to improve LE strength and balance         PT Education - 06/23/20 1656    Education Details form/techniqie with exercise    Person(s) Educated Patient    Methods Explanation;Demonstration    Comprehension Verbalized understanding;Returned demonstration             PT Short Term Goals - 06/22/20 0754      PT SHORT TERM GOAL #1   Title Patient will require moderate cueing for performance of HEP to continue benefits of therapy at home    Baseline dependent    Time 6    Period Weeks    Status New    Target Date 08/02/20             PT Long Term Goals - 06/22/20 0755      PT LONG TERM GOAL #1   Title Patient will improve DGI to >21 to indicate significant improvement in fall risk    Time 6    Period Weeks    Status New    Target Date 08/02/20      PT LONG TERM GOAL #2   Title pt will improve gait speed to 1.47m/s with LRAD to improve community mobility     Baseline .9 m/s    Time 6    Period Weeks    Status New      PT LONG TERM GOAL #3   Title pt will perform 5x sit to stand in <12s to indicate a reduced fall risk.    Baseline 17    Time 6    Period Weeks    Status New    Target Date 08/02/20      PT LONG TERM GOAL #4   Title Patient will demonstrate a significant improvement to 53 in his FOTO score to indicate significant improvement    Baseline 47    Time 6    Period Weeks    Status New    Target Date 08/02/20                 Plan - 06/23/20 1657    Clinical Impression Statement Performed exercises to improve strength in LE and balance. Patient tolerated exercises well without an increase in pain. Patient fatigued quickly at the end of session indicated needed improvment in endurance. Also, unable to complete standing march without UE assistance indicating a need to improve balance. Patient showed improvment in sit to stand from last session to this session. Patient will benefit from further skilled therapy to return to prior level of fucntion.    Personal Factors and Comorbidities Comorbidity 3+    Comorbidities Chronic LBP and ankle pain, Diabetes, frequent falls    Examination-Activity Limitations Squat;Lift;Stairs;Locomotion Level;Stand    Examination-Participation Restrictions Community Activity     Stability/Clinical Decision Making Stable/Uncomplicated    Rehab Potential Fair    PT Frequency 2x / week    PT Duration 6 weeks    PT Treatment/Interventions Wellsite geologist;Therapeutic activities;Therapeutic exercise;Neuromuscular re-education;Manual techniques;Dry needling;Passive range of motion;Patient/family education;Joint Manipulations;Spinal Manipulations    PT Next Visit Plan Address Hip  strengthening    PT Home Exercise Plan See education section    Consulted and Agree with Plan of Care Patient           Patient will benefit from skilled therapeutic intervention in order to improve the following deficits and impairments:  Pain,Abnormal gait,Impaired sensation,Decreased mobility,Increased muscle spasms,Postural dysfunction,Decreased strength,Decreased endurance,Decreased activity tolerance,Decreased balance,Difficulty walking  Visit Diagnosis: Muscle weakness (generalized)  Repeated falls     Problem List Patient Active Problem List   Diagnosis Date Noted  . Prolonged Q-T interval on ECG 06/08/2020  . Severe sepsis ([place]) 06/01/2020  . Acute metabolic encephalopathy 54/65/6812  . CAP (community acquired pneumonia) 05/30/2020  . 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 Right leg pain 02/05/2020  . Impingement of left ankle joint   . Sleeping difficulty 08/24/2019  . Hiccups 08/24/2019  . Chronic pain of left ankle 02/27/2019  . Vitamin D deficiency 02/18/2019  . Left shoulder pain 02/02/2019  . Neuropathy 09/24/2018  . Lumbosacral radiculopathy 01/14/2018  . 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 Facet arthritis of lumbar region 01/14/2018  . Chronic pain syndrome 08/26/2017  . 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 Thyroid nodule 02/27/2017  . Fatty liver 02/27/2017  . Purpura ([place]) 01/29/2017  . Right hip pain 08/16/2016  . Lumbar herniated disc 02/03/2016  . Hypertriglyceridemia 12/09/2015  . Anemia 12/09/2015  .  Falls 09/16/2015  . Barrett's esophagus 09/16/2015  . Chronic lumbar pain 09/16/2015  . Benign prostatic hyperplasia 09/16/2015  . Trochanteric bursitis of left hip 09/09/2015  . Headache 09/09/2015  . Difficulty in walking 06/01/2015  . Amnesia 06/01/2015  . HLD (hyperlipidemia) 02/23/2015  . DJD (degenerative joint disease) of knee 11/15/2014  . Hypertension   . Tremor, essential   . Primary localized osteoarthritis of right knee   . Anxiety and depression   . Absence of sensation 11/18/2013  . Tobacco abuse 11/18/2013  . Chronic diarrhea 11/12/2013  . Benign neoplasm of colon 08/25/2013  . 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 Type 2 diabetes mellitus ([place]) 10/06/2012    Candi Leash SPT 06/23/2020, 5:01 PM  [facility] [address] [place], [address] Phone: [phone]   Fax:  [phone]  Name: GREGROY DOMBKOWSKI MRN: [id number] Date of Birth: 10/05/1940

## 2020-06-27 ENCOUNTER — Ambulatory Visit: Payer: PPO

## 2020-06-27 ENCOUNTER — Other Ambulatory Visit: Payer: Self-pay | Admitting: Family Medicine

## 2020-06-27 DIAGNOSIS — E782 Mixed hyperlipidemia: Secondary | ICD-10-CM

## 2020-06-29 ENCOUNTER — Telehealth: Payer: Self-pay | Admitting: Family Medicine

## 2020-06-29 ENCOUNTER — Ambulatory Visit: Payer: PPO

## 2020-06-29 ENCOUNTER — Other Ambulatory Visit: Payer: Self-pay | Admitting: Family Medicine

## 2020-06-29 NOTE — Telephone Encounter (Signed)
LVM for the patient to call back. Patient needs a chest xray and a EKG he needs to be scheduled with the provider for the EKG, he can have the xray soon.  Andy Allende,cma

## 2020-06-29 NOTE — Telephone Encounter (Signed)
Patient called in wanted to know about a chest X-RAY he stated that he was told that Dr.Sonnenberg was to have chest x-rays done he could not tell me who told him that

## 2020-06-30 NOTE — Telephone Encounter (Signed)
Patient called back and I informed him to go to the hospital and have his chest xray and I scheduled him to come in for a EKG per the provider.  Shaiann Mcmanamon,cma

## 2020-07-05 ENCOUNTER — Encounter: Payer: Self-pay | Admitting: Family Medicine

## 2020-07-05 ENCOUNTER — Other Ambulatory Visit: Payer: Self-pay

## 2020-07-05 ENCOUNTER — Ambulatory Visit (INDEPENDENT_AMBULATORY_CARE_PROVIDER_SITE_OTHER): Payer: PPO | Admitting: Family Medicine

## 2020-07-05 ENCOUNTER — Ambulatory Visit (INDEPENDENT_AMBULATORY_CARE_PROVIDER_SITE_OTHER): Payer: PPO

## 2020-07-05 VITALS — BP 120/70 | HR 72 | Temp 98.1°F | Ht 71.0 in | Wt 170.0 lb

## 2020-07-05 DIAGNOSIS — F419 Anxiety disorder, unspecified: Secondary | ICD-10-CM

## 2020-07-05 DIAGNOSIS — J189 Pneumonia, unspecified organism: Secondary | ICD-10-CM | POA: Diagnosis not present

## 2020-07-05 DIAGNOSIS — E1142 Type 2 diabetes mellitus with diabetic polyneuropathy: Secondary | ICD-10-CM

## 2020-07-05 DIAGNOSIS — Z720 Tobacco use: Secondary | ICD-10-CM | POA: Diagnosis not present

## 2020-07-05 DIAGNOSIS — R9431 Abnormal electrocardiogram [ECG] [EKG]: Secondary | ICD-10-CM | POA: Diagnosis not present

## 2020-07-05 DIAGNOSIS — F32A Depression, unspecified: Secondary | ICD-10-CM | POA: Diagnosis not present

## 2020-07-05 DIAGNOSIS — N400 Enlarged prostate without lower urinary tract symptoms: Secondary | ICD-10-CM

## 2020-07-05 LAB — BASIC METABOLIC PANEL
BUN: 35 mg/dL — ABNORMAL HIGH (ref 6–23)
CO2: 29 mEq/L (ref 19–32)
Calcium: 9.3 mg/dL (ref 8.4–10.5)
Chloride: 99 mEq/L (ref 96–112)
Creatinine, Ser: 1.5 mg/dL (ref 0.40–1.50)
GFR: 43.72 mL/min — ABNORMAL LOW (ref >60.00)
Glucose, Bld: 165 mg/dL — ABNORMAL HIGH (ref 70–99)
Potassium: 4.2 mEq/L (ref 3.5–5.1)
Sodium: 136 mEq/L (ref 135–145)

## 2020-07-05 LAB — LIPID PANEL
Cholesterol: 99 mg/dL (ref 0–200)
HDL: 43.2 mg/dL (ref >39.00)
NonHDL: 55.98
Total CHOL/HDL Ratio: 2
Triglycerides: 226 mg/dL — ABNORMAL HIGH (ref 0.0–149.0)
VLDL: 45.2 mg/dL — ABNORMAL HIGH (ref 0.0–40.0)

## 2020-07-05 LAB — LDL CHOLESTEROL, DIRECT: Direct LDL: 31 mg/dL

## 2020-07-05 MED ORDER — NICOTINE 7 MG/24HR TD PT24: 7.0000 mg | MEDICATED_PATCH | Freq: Every day | TRANSDERMAL | 0 refills | Status: DC

## 2020-07-05 MED ORDER — NICOTINE 14 MG/24HR TD PT24: 14.0000 mg | MEDICATED_PATCH | Freq: Every day | TRANSDERMAL | 0 refills | Status: DC

## 2020-07-05 NOTE — Patient Instructions (Signed)
Nice to see you. We will get some lab work today. We will get a chest x-ray today. I have referred you to urology. Please try the nicotine patches.  If these are not covered by your insurance you can try the 1 800 quit now line and see if they would provide you with any patches.

## 2020-07-05 NOTE — Assessment & Plan Note (Signed)
His sugars have trended up since being ill.  I suspect that is playing a role.  We will check his sugar today.  He is not yet due for an A1c.  He will continue Ozempic 0.5 mg once weekly and Jardiance 25 mg daily.

## 2020-07-05 NOTE — Assessment & Plan Note (Signed)
Most recent EKG reviewed with normal QTC.  Given his medication regimen it would be reasonable to periodically check an EKG though I advised that we do not have to do this so soon after his most recent EKG.

## 2020-07-05 NOTE — Progress Notes (Signed)
Tommi Rumps, MD Phone: [phone]  James Hood is a 80 y.o. male who presents today for follow-up.  Anxiety/depression: Patient saw Dr. Modesta Messing with psychiatry for an appointment.  She is transitioning him to extended release Wellbutrin.  He notes he has only been on this a week or so and has not noticed any difference yet.  He has mild chronic depression mostly surrounding the political environment in the country.  He notes he is a strong conservative.  He denies SI.  They are having him start counseling as well.  They did note he had a follow-up EKG in the ED on 06/08/2020 that revealed a normal QTC.  Diabetes: 7-day average 164.  90-day average 137.  He is checking 2-4 times daily on his freestyle libre.  Notes that his sugar started to go up after he had pneumonia.  He is on [place] and Ozempic.  No polyuria or polydipsia.  No hypoglycemia.  He has had no dietary changes.  History of pneumonia: He feels like he is back to normal.  His pulse ox is consistently greater than 95%.  Doing well overall.  BPH: Patient notes frequent urination and he does not feel as though he empties his bladder fully.  No straining.  Notes the flow is okay.  No dysuria.  Some urgency with urge incontinence.  Tobacco abuse: He is back to smoking half a pack per day.  He quit for 3 weeks after being ill and he wonders if he should do the nicotine patches.  Social History   Tobacco Use  Smoking Status Former Smoker  . Packs/day: 0.50  . Years: 60.00  . Pack years: 30.00  . Types: Cigarettes  Smokeless Tobacco Never Used    Current Outpatient Medications on File Prior to Visit  Medication Sig Dispense Refill  . aspirin EC 81 MG tablet Take 81 mg by mouth daily.    . budesonide (ENTOCORT EC) 3 MG 24 hr capsule Take 9 mg by mouth daily.    Marland Kitchen buPROPion (WELLBUTRIN XL) 300 MG 24 hr tablet Take 1 tablet (300 mg total) by mouth daily. 30 tablet 1  . Continuous Blood Gluc Receiver (FREESTYLE LIBRE 14  DAY READER) DEVI USE DAILY AS DIRECED TO CHECK BLOOD GLUCOSE 1 each 0  . Continuous Blood Gluc Sensor (FREESTYLE LIBRE 14 DAY SENSOR) MISC APPLY ONE SENSOR EVERY 14 DAYS TO MONITOR BLOOD GLUCOSE LEVELS.  REMOVE OLD SENSOR BEFORE APPLYING NEW SENSOR 1 each 4  . diphenoxylate-atropine (LOMOTIL) 2.5-0.025 MG tablet Take 1 tablet by mouth 4 (four) times daily as needed.    . divalproex (DEPAKOTE ER) 500 MG 24 hr tablet Take 1 tablet by mouth 2 (two) times daily.    Marland Kitchen donepezil (ARICEPT) 10 MG tablet Take 10 mg by mouth at bedtime.    . fenofibrate (TRICOR) 145 MG tablet TAKE ONE TABLET BY MOUTH DAILY 90 tablet 1  . finasteride (PROSCAR) 5 MG tablet TAKE ONE TABLET BY MOUTH DAILY 90 tablet 0  . gabapentin (NEURONTIN) 600 MG tablet TAKE TWO TABLETS BY MOUTH THREE TIMES A DAY 180 tablet 1  . HYDROcodone-acetaminophen (NORCO) 10-325 MG tablet Take 1 tablet by mouth every 4 (four) hours as needed for severe pain. Must last 30 days. 180 tablet 0  . icosapent Ethyl (VASCEPA) 1 g capsule Take 2 g by mouth 2 (two) times daily.    Marland Kitchen JARDIANCE 25 MG TABS tablet TAKE ONE TABLET BY MOUTH DAILY 30 tablet 5  . lisinopril-hydrochlorothiazide (ZESTORETIC) 20-25  MG tablet TAKE ONE TABLET BY MOUTH TWICE A DAY 180 tablet 1  . memantine (NAMENDA) 10 MG tablet TAKE ONE TABLET BY MOUTH TWICE A DAY    . metoprolol succinate (TOPROL-XL) 25 MG 24 hr tablet Take 25 mg by mouth daily.    . Multiple Vitamins-Minerals (CENTRUM SILVER PO) Take 1 tablet by mouth daily.    Marland Kitchen omeprazole (PRILOSEC) 20 MG capsule Take 20 mg by mouth 2 (two) times daily before a meal.     . primidone (MYSOLINE) 50 MG tablet Take 150-250 mg by mouth 2 (two) times daily. Take 250 mg by mouth in the morning & take 150 mg at night.    . QUEtiapine (SEROQUEL) 100 MG tablet Take 150 mg by mouth at bedtime.    . rosuvastatin (CRESTOR) 40 MG tablet TAKE ONE TABLET BY MOUTH DAILY 90 tablet 0  . Semaglutide,0.25 or 0.5MG /DOS, (OZEMPIC, 0.25 OR 0.5 MG/DOSE,) 2  MG/1.5ML SOPN Inject 0.5 mg into the skin once a week. 1.5 mL 3  . tamsulosin (FLOMAX) 0.4 MG CAPS capsule TAKE ONE CAPSULE BY MOUTH DAILY AFTER SUPPER 90 capsule 0   No current facility-administered medications on file prior to visit.     ROS see history of present illness  Objective  Physical Exam Vitals:   07/05/20 1141  BP: 120/70  Pulse: 72  Temp: 98.1 F (36.7 C)  SpO2: 98%    BP Readings from Last 3 Encounters:  07/05/20 120/70  06/08/20 129/75  06/01/20 136/66   Wt Readings from Last 3 Encounters:  07/05/20 170 lb (77.1 kg)  06/08/20 170 lb (77.1 kg)  06/08/20 170 lb (77.1 kg)    Physical Exam Constitutional:      General: He is not in acute distress.    Appearance: He is not diaphoretic.  Cardiovascular:     Rate and Rhythm: Normal rate and regular rhythm.     Heart sounds: Normal heart sounds.  Pulmonary:     Effort: Pulmonary effort is normal.     Breath sounds: Normal breath sounds.  Musculoskeletal:        General: No edema.     Right lower leg: No edema.     Left lower leg: No edema.  Skin:    General: Skin is warm and dry.  Neurological:     Mental Status: He is alert.      Assessment/Plan: Please see individual problem list.  Problem List Items Addressed This Visit    Anxiety and depression    Chronic and stable.  He will continue with medication changes through psychiatry.      Benign prostatic hyperplasia    We will refer back to urology.  He will continue Flomax 0.4 mg once daily and Proscar 5 mg daily.      Relevant Orders   Ambulatory referral to Urology   CAP (community acquired pneumonia) - Primary    The patient has recovered quite well.  He will continue to monitor for any recurrent symptoms.  He is up-to-date on pneumonia vaccinations.  Follow-up chest x-ray today.      Prolonged Q-T interval on ECG    Most recent EKG reviewed with normal QTC.  Given his medication regimen it would be reasonable to periodically check an  EKG though I advised that we do not have to do this so soon after his most recent EKG.      Tobacco abuse    I congratulated him on quitting smoking for 3 weeks.  Encouraged smoking cessation at this time.  Discussed nicotine patches.  These were sent to his pharmacy.  Advised he could also contact [phone] quit now line.      Relevant Medications   nicotine (NICODERM CQ - DOSED IN MG/24 HOURS) 14 mg/24hr patch   nicotine (NICODERM CQ - DOSED IN MG/24 HR) 7 mg/24hr patch   Type 2 diabetes mellitus ([place])    His sugars have trended up since being ill.  I suspect that is playing a role.  We will check his sugar today.  He is not yet due for an A1c.  He will continue Ozempic 0.5 mg once weekly and Jardiance 25 mg daily.      Relevant Orders   Lipid panel   Basic Metabolic Panel (BMET)       This visit occurred during the SARS-CoV-2 public health emergency.  Safety protocols were in place, including screening questions prior to the visit, additional usage of staff PPE, and extensive cleaning of exam room while observing appropriate contact time as indicated for disinfecting solutions.    Tommi Rumps, MD [place]

## 2020-07-05 NOTE — Assessment & Plan Note (Signed)
I congratulated him on quitting smoking for 3 weeks.  Encouraged smoking cessation at this time.  Discussed nicotine patches.  These were sent to his pharmacy.  Advised he could also contact [phone] quit now line.

## 2020-07-05 NOTE — Assessment & Plan Note (Signed)
Chronic and stable.  He will continue with medication changes through psychiatry.

## 2020-07-05 NOTE — Assessment & Plan Note (Signed)
We will refer back to urology.  He will continue Flomax 0.4 mg once daily and Proscar 5 mg daily.

## 2020-07-05 NOTE — Assessment & Plan Note (Addendum)
The patient has recovered quite well.  He will continue to monitor for any recurrent symptoms.  He is up-to-date on pneumonia vaccinations.  Follow-up chest x-ray today.

## 2020-07-06 DIAGNOSIS — R4189 Other symptoms and signs involving cognitive functions and awareness: Secondary | ICD-10-CM | POA: Diagnosis not present

## 2020-07-06 DIAGNOSIS — G479 Sleep disorder, unspecified: Secondary | ICD-10-CM | POA: Diagnosis not present

## 2020-07-06 DIAGNOSIS — R251 Tremor, unspecified: Secondary | ICD-10-CM | POA: Diagnosis not present

## 2020-07-06 DIAGNOSIS — R262 Difficulty in walking, not elsewhere classified: Secondary | ICD-10-CM | POA: Diagnosis not present

## 2020-07-06 DIAGNOSIS — F17200 Nicotine dependence, unspecified, uncomplicated: Secondary | ICD-10-CM | POA: Diagnosis not present

## 2020-07-06 DIAGNOSIS — Z720 Tobacco use: Secondary | ICD-10-CM | POA: Diagnosis not present

## 2020-07-08 ENCOUNTER — Other Ambulatory Visit: Payer: Self-pay | Admitting: Family Medicine

## 2020-07-08 DIAGNOSIS — N179 Acute kidney failure, unspecified: Secondary | ICD-10-CM

## 2020-07-11 ENCOUNTER — Ambulatory Visit
Admission: RE | Admit: 2020-07-11 | Discharge: 2020-07-11 | Disposition: A | Discharge status: Home / Self Care | Payer: PPO | Source: Ambulatory Visit | Attending: Family Medicine | Admitting: Family Medicine

## 2020-07-11 ENCOUNTER — Other Ambulatory Visit: Payer: Self-pay

## 2020-07-11 DIAGNOSIS — G319 Degenerative disease of nervous system, unspecified: Secondary | ICD-10-CM | POA: Diagnosis not present

## 2020-07-11 DIAGNOSIS — R278 Other lack of coordination: Secondary | ICD-10-CM

## 2020-07-11 DIAGNOSIS — I739 Peripheral vascular disease, unspecified: Secondary | ICD-10-CM | POA: Diagnosis not present

## 2020-07-11 DIAGNOSIS — R270 Ataxia, unspecified: Secondary | ICD-10-CM | POA: Diagnosis not present
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
## 2020-07-13 ENCOUNTER — Other Ambulatory Visit: Payer: Self-pay | Admitting: Family Medicine

## 2020-07-14 ENCOUNTER — Other Ambulatory Visit: Payer: Self-pay

## 2020-07-14 ENCOUNTER — Ambulatory Visit: Payer: PPO | Admitting: Urology

## 2020-07-14 ENCOUNTER — Encounter: Payer: Self-pay | Admitting: Urology

## 2020-07-14 VITALS — BP 112/72 | HR 86 | Ht 70.0 in | Wt 170.0 lb

## 2020-07-14 DIAGNOSIS — N3281 Overactive bladder: Secondary | ICD-10-CM | POA: Diagnosis not present

## 2020-07-14 DIAGNOSIS — N400 Enlarged prostate without lower urinary tract symptoms: Secondary | ICD-10-CM

## 2020-07-14 LAB — URINALYSIS, COMPLETE
Bilirubin, UA: NEGATIVE
Ketones, UA: NEGATIVE
Leukocytes,UA: NEGATIVE
Nitrite, UA: NEGATIVE
Protein,UA: NEGATIVE
RBC, UA: NEGATIVE
Specific Gravity, UA: 1.01 (ref 1.005–1.030)
Urobilinogen, Ur: 0.2 mg/dL (ref 0.2–1.0)
pH, UA: 7 (ref 5.0–7.5)

## 2020-07-14 LAB — MICROSCOPIC EXAMINATION
Bacteria, UA: NONE SEEN
Epithelial Cells (non renal): NONE SEEN /hpf (ref 0–10)

## 2020-07-14 LAB — BLADDER SCAN AMB NON-IMAGING

## 2020-07-14 NOTE — Patient Instructions (Addendum)
Cystoscopy Cystoscopy is a procedure that is used to help diagnose and sometimes treat conditions that affect the lower urinary tract. The lower urinary tract includes the bladder and the urethra. The urethra is the tube that drains urine from the bladder. Cystoscopy is done using a thin, tube-shaped instrument with a light and camera at the end (cystoscope). The cystoscope may be hard or flexible, depending on the goal of the procedure. The cystoscope is inserted through the urethra, into the bladder. Cystoscopy may be recommended if you have:  Urinary tract infections that keep coming back.  Blood in the urine (hematuria).  An inability to control when you urinate (urinary incontinence) or an overactive bladder.  Unusual cells found in a urine sample.  A blockage in the urethra, such as a urinary stone.  Painful urination.  An abnormality in the bladder found during an intravenous pyelogram (IVP) or CT scan. Cystoscopy may also be done to remove a sample of tissue to be examined under a microscope (biopsy). What are the risks? Generally, this is a safe procedure. However, problems may occur, including:  Infection.  Bleeding.  What happens during the procedure?  1. You will be given one or more of the following: ? A medicine to numb the area (local anesthetic). 2. The area around the opening of your urethra will be cleaned. 3. The cystoscope will be passed through your urethra into your bladder. 4. Germ-free (sterile) fluid will flow through the cystoscope to fill your bladder. The fluid will stretch your bladder so that your health care provider can clearly examine your bladder walls. 5. Your doctor will look at the urethra and bladder. 6. The cystoscope will be removed The procedure may vary among health care providers  What can I expect after the procedure? After the procedure, it is common to have: 1. Some soreness or pain in your abdomen and urethra. 2. Urinary symptoms.  These include: ? Mild pain or burning when you urinate. Pain should stop within a few minutes after you urinate. This may last for up to 1 week. ? A small amount of blood in your urine for several days. ? Feeling like you need to urinate but producing only a small amount of urine. Follow these instructions at home: General instructions  Return to your normal activities as told by your health care provider.   Do not drive for 24 hours if you were given a sedative during your procedure.  Watch for any blood in your urine. If the amount of blood in your urine increases, call your health care provider.  If a tissue sample was removed for testing (biopsy) during your procedure, it is up to you to get your test results. Ask your health care provider, or the department that is doing the test, when your results will be ready.  Drink enough fluid to keep your urine pale yellow.  Keep all follow-up visits as told by your health care provider. This is important. Contact a health care provider if you:  Have pain that gets worse or does not get better with medicine, especially pain when you urinate.  Have trouble urinating.  Have more blood in your urine. Get help right away if you:  Have blood clots in your urine.  Have abdominal pain.  Have a fever or chills.  Are unable to urinate. Summary  Cystoscopy is a procedure that is used to help diagnose and sometimes treat conditions that affect the lower urinary tract.  Cystoscopy is done using   a thin, tube-shaped instrument with a light and camera at the end.  After the procedure, it is common to have some soreness or pain in your abdomen and urethra.  Watch for any blood in your urine. If the amount of blood in your urine increases, call your health care provider.  If you were prescribed an antibiotic medicine, take it as told by your health care provider. Do not stop taking the antibiotic even if you start to feel better. This  information is not intended to replace advice given to you by your health care provider. Make sure you discuss any questions you have with your health care provider. Document Revised: 04/29/2018 Document Reviewed: 04/29/2018 Elsevier Patient Education  2020 [place].   Prostatic Urethral Lift  Prostatic urethral lift is a surgical procedure to relieve symptoms of prostate gland enlargement that occurs with age (benign prostatic hypertrophy, BPH). The part of the body that drains urine from the bladder (urethra) passes between the two lobes of the prostate. As the prostate enlarges, it can push on the urethra and cause problems with urinating. This procedure involves placing an implant that holds the prostate away from the urethra. The procedure is performed with a thin operating telescope (cystoscope) that is inserted through the tip of the penis and moved up the urethra to the prostate. This is less invasive than other procedures that require an incision. You may have this procedure if:  You have symptoms of BPH.  Your prostate is not severely enlarged.  Medicines to treat BPH are not working or not tolerated.  You want to avoid possible sexual side effects from medicines or other procedures that are used to treat BPH. Tell a health care provider about:  Any allergies you have.  All medicines you are taking, including vitamins, herbs, eye drops, creams, and over-the-counter medicines.  Previous problems you or members of your family have had with the use of anesthetics.  Any blood disorders you have.  Previous surgeries you have had.  Any medical conditions you have. What are the risks?  Bleeding.  Infection.  Leaking of urine (incontinence).  Allergic reactions to medicines.  Return of BPH symptoms after 2 years, requiring more treatment. What happens before the procedure?  Ask your health care provider about: ? Changing or stopping your regular medicines. This is  especially important if you are taking diabetes medicines or blood thinners. ? Taking medicines such as aspirin and ibuprofen. These medicines can thin your blood. Do not take these medicines before your procedure if your health care provider instructs you not to.  Follow instructions from your health care provider about eating or drinking restrictions.  Plan to have someone take you home from the hospital or clinic. What happens during the procedure?  To lower your risk of infection: ? Your health care team will wash or sanitize their hands. ? Your skin will be washed with soap.  An IV may be inserted into one of your veins.  You will be given one or more of the following: ? A medicine to help you relax (sedative). ? A medicine that is injected into your urethra to numb the area (local anesthetic). ? A medicine to make you fall asleep (general anesthetic).  A cystoscope will be inserted into your penis and moved through your urethra to your prostate.  A device will be inserted through the cystoscope and used to press the lobes of your prostate away from your urethra.  Implants will be inserted through  the device to hold the lobes of your prostate in the widened position.  The device and cystoscope will be removed. The procedure may vary among health care providers and hospitals. What happens after the procedure?  Your blood pressure, heart rate, breathing rate, and blood oxygen level will be monitored until the medicines you were given have worn off.  Do not drive for 24 hours if you were given a sedative. Summary  Prostatic urethral lift is a surgical procedure to relieve symptoms of prostate gland enlargement that occurs with age (benign prostatic hypertrophy, BPH).  The procedure is performed with a thin operating telescope (cystoscope) that is inserted through the tip of the penis and moved up the part of the body that drains urine from the bladder (urethra) to reach the  prostate. This is less invasive than other procedures that require an incision.  Plan to have someone take you home from the hospital or clinic. You will not be allowed to drive for 24 hours if you were given a sedative during the procedure. This information is not intended to replace advice given to you by your health care provider. Make sure you discuss any questions you have with your health care provider. Document Revised: 01/14/2020 Document Reviewed: 01/14/2020 Elsevier Patient Education  [employer].

## 2020-07-14 NOTE — Progress Notes (Signed)
07/14/20 1:59 PM   James Hood Sep 20, 1940 [id number]  CC: BPH and urinary symptoms  HPI: I saw James Hood in urology clinic today for the above issues. He is an 80 year old relatively healthy male with a long history of urinary symptoms of weak stream, frequency, urgency, nocturia. He is also had worsening incontinence and dribbling lately that requires a depends. He was last seen by urology by Dr. Pilar Jarvis in 2017 and has been on Flomax and finasteride since that time. A CT in 2018 showed a 24 g prostate. PVR is normal today at 60 mL, and urinalysis is completely benign. IPSS score today is 21, with quality of life mostly dissatisfied. He denies any UTIs, gross hematuria, or history of urinary retention. He has a history of a spinal cord stimulator that was placed for pain/sciatica, but this was removed secondary to infection.  His past medical history is also notable for diabetes, and hemoglobin A1c is 6.7, he drinks water and diet ginger ale during the day.  PSA normal at 0.01 in March 2021  PMH: Past Medical History:  Diagnosis Date  . Anxiety   . Anxiety and depression   . Arthritis   . BPH (benign prostatic hyperplasia)   . 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 Colon polyps   . Dementia ([place])   . Depression   . Diabetes mellitus type 2 in nonobese (HCC)   . GERD (gastroesophageal reflux disease)    BARRETTS ESOPHAGUS RESOLVED PER PATIENT  . Headache   . 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 History of hiatal hernia   . Hypercholesteremia   . Hypertension   . Hyperthyroidism   . Neuropathic pain   . Primary localized osteoarthritis of right knee   . 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 Stomach ulcer   . Tremor, essential   . Wound infection complicating hardware ([place]) 04/02/2018    Surgical History: Past Surgical History:  Procedure Laterality Date  . ANKLE ARTHROSCOPY Left 09/29/2019   Procedure: LEFT ANKLE  ARTHROSCOPY, DEBRIDEMENT;  Surgeon: Newt Minion, MD;  Location: [facility];  Service: Orthopedics;  Laterality: Left;  . BACK SURGERY    . COLONOSCOPY WITH PROPOFOL N/A 09/14/2016   Procedure: COLONOSCOPY WITH PROPOFOL;  Surgeon: Manya Silvas, MD;  Location: [facility] ENDOSCOPY;  Service: Endoscopy;  Laterality: N/A;  . esophageal stretch    . JOINT REPLACEMENT Right 2016   knee  . LUMBAR LAMINECTOMY/DECOMPRESSION MICRODISCECTOMY Left 02/03/2016   Procedure: LEFT L5-S1 DISKECTOMY;  Surgeon: Leeroy Cha, MD;  Location: [facility] NEURO ORS;  Service: Neurosurgery;  Laterality: Left;  LEFT L5-S1 DISKECTOMY  . PULSE GENERATOR IMPLANT N/A 08/21/2017   Procedure: UNILATERAL PULSE GENERATOR IMPLANT;  Surgeon: Meade Maw, MD;  Location: [facility] ORS;  Service: Neurosurgery;  Laterality: N/A;  . PULSE GENERATOR IMPLANT Right 04/02/2018   Procedure: REMOVAL OF PULSE GENERATOR IMPLANT AND LEADS;  Surgeon: Meade Maw, MD;  Location: [facility] ORS;  Service: Neurosurgery;  Laterality: Right;  . TONSILLECTOMY    . TOTAL KNEE ARTHROPLASTY Right 11/15/2014   Procedure: TOTAL KNEE ARTHROPLASTY;  Surgeon: Elsie Saas, MD;  Location: [place];  Service: Orthopedics;  Laterality: Right;    Family History: Family History  Problem Relation Age of Onset  . Heart attack Mother   . Heart disease Mother   . Hypertension Father   . Depression Father   . Kidney cancer Neg Hx   . Prostate cancer Neg Hx  Social History:  reports that he has quit smoking. His smoking use included cigarettes. He has a 30.00 pack-year smoking history. He has never used smokeless tobacco. He reports previous alcohol use. He reports that he does not use drugs.  Physical Exam: BP 112/72 (BP Location: Left Arm, Patient Position: Sitting, Cuff Size: Normal)   Pulse 86   Ht 5\' 10"  (1.778 m)   Wt 170 lb (77.1 kg)   BMI 24.39 kg/m    Constitutional:  Alert and oriented, No acute distress. Cardiovascular: No clubbing,  cyanosis, or edema. Respiratory: Normal respiratory effort, no increased work of breathing. GI: Abdomen is soft, nontender, nondistended, no abdominal masses GU: No CVA tenderness, phallus without lesions, widely patent meatus, testicles 20 cc and descended bilaterally without masses DRE: 25 g, smooth, no masses or nodules  Laboratory Data: Reviewed, see HPI  Pertinent Imaging: I have personally viewed and interpreted the CT from 2018 showing a 24 g prostate, but no nephrolithiasis or hydronephrosis  Assessment & Plan:   80 year old relatively healthy male with worsening urinary symptoms over the last few years despite maximal medical therapy with Flomax and finasteride. Primary symptoms are urinary frequency, weak stream, and dribbling. Urinalysis, PVR, and DRE all benign today. Prostate measures 25 g on prior CT.  We reviewed the overlap between BPH and OAB at length. I recommended cystoscopy for better evaluation, as well as consideration of potential UroLift. Could consider discontinuing finasteride and adding a OAB medication like Myrbetriq if prostate appears nonobstructive.  Follow-up for cystoscopy  Nickolas Madrid, MD 07/14/2020  [facility] [address] [place],  [address] [phone]

## 2020-07-18 ENCOUNTER — Encounter: Payer: Self-pay | Admitting: Urology

## 2020-07-18 ENCOUNTER — Encounter: Payer: Self-pay | Admitting: Psychiatry

## 2020-07-18 ENCOUNTER — Other Ambulatory Visit: Payer: Self-pay

## 2020-07-18 ENCOUNTER — Other Ambulatory Visit: Payer: Self-pay | Admitting: Family Medicine

## 2020-07-18 ENCOUNTER — Ambulatory Visit (INDEPENDENT_AMBULATORY_CARE_PROVIDER_SITE_OTHER): Payer: PPO | Admitting: Urology

## 2020-07-18 ENCOUNTER — Telehealth: Payer: PPO | Admitting: Psychiatry

## 2020-07-18 ENCOUNTER — Telehealth (INDEPENDENT_AMBULATORY_CARE_PROVIDER_SITE_OTHER): Payer: PPO | Admitting: Psychiatry

## 2020-07-18 VITALS — BP 116/70 | HR 81 | Ht 70.0 in | Wt 170.0 lb

## 2020-07-18 DIAGNOSIS — N3281 Overactive bladder: Secondary | ICD-10-CM

## 2020-07-18 DIAGNOSIS — F330 Major depressive disorder, recurrent, mild: Secondary | ICD-10-CM

## 2020-07-18 DIAGNOSIS — G4700 Insomnia, unspecified: Secondary | ICD-10-CM | POA: Diagnosis not present

## 2020-07-18 DIAGNOSIS — N400 Enlarged prostate without lower urinary tract symptoms: Secondary | ICD-10-CM

## 2020-07-18 MED ORDER — LIDOCAINE HCL URETHRAL/MUCOSAL 2 % EX GEL
1.0000 "application " | Freq: Once | CUTANEOUS | Status: AC
  Administered 2020-07-18: 1 via URETHRAL

## 2020-07-18 MED ORDER — MIRABEGRON ER 50 MG PO TB24: 50.0000 mg | ORAL_TABLET | Freq: Every day | ORAL | 0 refills | Status: DC

## 2020-07-18 MED ORDER — TRAZODONE HCL 50 MG PO TABS: ORAL_TABLET | ORAL | 1 refills | Status: DC

## 2020-07-18 NOTE — Patient Instructions (Signed)
Overactive Bladder, Adult  Overactive bladder is a condition in which a person has a sudden and frequent need to urinate. A person might also leak urine if he or she cannot get to the bathroom fast enough (urinary incontinence). Sometimes, symptoms can interfere with work or social activities. What are the causes? Overactive bladder is associated with poor nerve signals between your bladder and your brain. Your bladder may get the signal to empty before it is full. You may also have very sensitive muscles that make your bladder squeeze too soon. This condition may also be caused by other factors, such as:  Medical conditions: ? Urinary tract infection. ? Infection of nearby tissues. ? Prostate enlargement. ? Bladder stones, inflammation, or tumors. ? Diabetes. ? Muscle or nerve weakness, especially from these conditions:  A spinal cord injury.  Stroke.  Multiple sclerosis.  Parkinson's disease.  Other causes: ? Surgery on the uterus or urethra. ? Drinking too much caffeine or alcohol. ? Certain medicines, especially those that eliminate extra fluid in the body (diuretics). ? Constipation. What increases the risk? You may be at greater risk for overactive bladder if you:  Are an older adult.  Smoke.  Are going through menopause.  Have prostate problems.  Have a neurological disease, such as stroke, dementia, Parkinson's disease, or multiple sclerosis (MS).  Eat or drink alcohol, spicy food, caffeine, and other things that irritate the bladder.  Are overweight or obese. What are the signs or symptoms? Symptoms of this condition include a sudden, strong urge to urinate. Other symptoms include:  Leaking urine.  Urinating 8 or more times a day.  Waking up to urinate 2 or more times overnight. How is this diagnosed? This condition may be diagnosed based on:  Your symptoms and medical history.  A physical exam.  Blood or urine tests to check for possible causes,  such as infection. You may also need to see a health care provider who specializes in urinary tract problems. This is called a urologist. How is this treated? Treatment for overactive bladder depends on the cause of your condition and whether it is mild or severe. Treatment may include:  Bladder training, such as: ? Learning to control the urge to urinate by following a schedule to urinate at regular intervals. ? Doing Kegel exercises to strengthen the pelvic floor muscles that support your bladder.  Special devices, such as: ? Biofeedback. This uses sensors to help you become aware of your body's signals. ? Electrical stimulation. This uses electrodes placed inside the body (implanted) or outside the body. These electrodes send gentle pulses of electricity to strengthen the nerves or muscles that control the bladder. ? Women may use a plastic device, called a pessary, that fits into the vagina and supports the bladder.  Medicines, such as: ? Antibiotics to treat bladder infection. ? Antispasmodics to stop the bladder from releasing urine at the wrong time. ? Tricyclic antidepressants to relax bladder muscles. ? Injections of botulinum toxin type A directly into the bladder tissue to relax bladder muscles.  Surgery, such as: ? A device may be implanted to help manage the nerve signals that control urination. ? An electrode may be implanted to stimulate electrical signals in the bladder. ? A procedure may be done to change the shape of the bladder. This is done only in very severe cases. Follow these instructions at home: Eating and drinking  Make diet or lifestyle changes recommended by your health care provider. These may include: ? Drinking fluids   throughout the day and not only with meals. ? Cutting down on caffeine or alcohol. ? Eating a healthy and balanced diet to prevent constipation. This may include:  Choosing foods that are high in fiber, such as beans, whole grains, and  fresh fruits and vegetables.  Limiting foods that are high in fat and processed sugars, such as fried and sweet foods.   Lifestyle  Lose weight if needed.  Do not use any products that contain nicotine or tobacco. These include cigarettes, chewing tobacco, and vaping devices, such as e-cigarettes. If you need help quitting, ask your health care provider.   General instructions  Take over-the-counter and prescription medicines only as told by your health care provider.  If you were prescribed an antibiotic medicine, take it as told by your health care provider. Do not stop taking the antibiotic even if you start to feel better.  Use any implants or pessary as told by your health care provider.  If needed, wear pads to absorb urine leakage.  Keep a log to track how much and when you drink, and when you need to urinate. This will help your health care provider monitor your condition.  Keep all follow-up visits. This is important. Contact a health care provider if:  You have a fever or chills.  Your symptoms do not get better with treatment.  Your pain and discomfort get worse.  You have more frequent urges to urinate. Get help right away if:  You are not able to control your bladder. Summary  Overactive bladder refers to a condition in which a person has a sudden and frequent need to urinate.  Several conditions may lead to an overactive bladder.  Treatment for overactive bladder depends on the cause and severity of your condition.  Making lifestyle changes, doing Kegel exercises, keeping a log, and taking medicines can help with this condition. This information is not intended to replace advice given to you by your health care provider. Make sure you discuss any questions you have with your health care provider. Document Revised: 01/25/2020 Document Reviewed: 01/25/2020 Elsevier Patient Education  2021 Elsevier Inc.  

## 2020-07-18 NOTE — Progress Notes (Signed)
Cystoscopy Procedure Note:  Indication: Urinary symptoms, weak stream, urgency, frequency, urge incontinence  After informed consent and discussion of the procedure and its risks, KNUT RONDINELLI was positioned and prepped in the standard fashion. Cystoscopy was performed with a flexible cystoscope. The urethra, bladder neck and entire bladder was visualized in a standard fashion. The prostate was very short without clear evidence of obstruction. The ureteral orifices were visualized in their normal location and orientation.  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  There were no bladder trabeculations or other abnormalities.  Retroflexion shows a small suspected ureterocele at the base of the bladder, but no other abnormalities.  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  Risks and benefits discussed.  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  Trial of Myrbetriq 50 mg daily for urinary symptoms, discontinue finasteride  Nickolas Madrid, MD 07/18/2020

## 2020-07-19 ENCOUNTER — Ambulatory Visit (INDEPENDENT_AMBULATORY_CARE_PROVIDER_SITE_OTHER): Payer: PPO | Admitting: Licensed Clinical Social Worker

## 2020-07-19 DIAGNOSIS — F330 Major depressive disorder, recurrent, mild: Secondary | ICD-10-CM

## 2020-07-19 NOTE — Progress Notes (Signed)
Virtual Visit via Video Note  I connected with James Hood on 07/19/20 at  3:00 PM EST by a video enabled telemedicine application and verified that I am speaking with the correct person using two identifiers.  Video connection was lost when less than 50% of the duration of the visit was complete, at which time the remainder of the visit was completed via audio only.  Location: Patient: home Provider: [facility]   I discussed the limitations of evaluation and management by telemedicine and the availability of in person appointments. The patient expressed understanding and agreed to proceed.   I discussed the assessment and treatment plan with the patient. The patient was provided an opportunity to ask questions and all were answered. The patient agreed with the plan and demonstrated an understanding of the instructions.   The patient was advised to call back or seek an in-person evaluation if the symptoms worsen or if the condition fails to improve as anticipated.  I provided 60 minutes of non-face-to-face time during this encounter.   Rachel Bo Eryx Zane, LCSW    Comprehensive Clinical Assessment (CCA) Note  07/19/2020 James Hood [id number]  Chief Complaint:  Chief Complaint  Patient presents with  . Agitation  . Anxiety  . Depression   Visit Diagnosis:   Major Depressive Disorder, recurrent, mild Agitation Generalized anxiety    CCA Screening, Triage and Referral (STR)  Patient Reported Information How did you hear about [place]? No data recorded Referral name: No data recorded Referral phone number: No data recorded  Whom do you see for routine medical problems? No data recorded Practice/Facility Name: No data recorded Practice/Facility Phone Number: No data recorded Name of Contact: No data recorded Contact Number: No data recorded Contact Fax Number: No data recorded Prescriber Name: No data recorded Prescriber Address (if known): No data recorded  What  Is the Reason for Your Visit/Call Today? No data recorded How Long Has This Been Causing You Problems? No data recorded What Do You Feel Would Help You the Most Today? Assessment Only; Therapy   Have You Recently Been in Any Inpatient Treatment (Hospital/Detox/Crisis Center/28-Day Program)? No  Name/Location of Program/Hospital:No data recorded How Long Were You There? No data recorded When Were You Discharged? No data recorded  Have You Ever Received Services From [facility] Before? No  Who Do You See at [facility]? No data recorded  Have You Recently Had Any Thoughts About Hurting Yourself? No  Are You Planning to Commit Suicide/Harm Yourself At This time? No   Have you Recently Had Thoughts About [place]? No  Explanation: No data recorded  Have You Used Any Alcohol or Drugs in the Past 24 Hours? No  How Long Ago Did You Use Drugs or Alcohol? No data recorded What Did You Use and How Much? No data recorded  Do You Currently Have a Therapist/Psychiatrist? Yes  Name of Therapist/Psychiatrist: Dr. Modesta Messing   Have You Been Recently Discharged From Any Office Practice or Programs? No  Explanation of Discharge From Practice/Program: No data recorded    CCA Screening Triage Referral Assessment Type of Contact: Tele-Assessment  Is this Initial or Reassessment? Initial Assessment  Date Telepsych consult ordered in [facility]:  No data recorded Time Telepsych consult ordered in [facility]:  No data recorded  Patient Reported Information Reviewed? No data recorded Patient Left Without Being Seen? No data recorded Reason for Not Completing Assessment: No data recorded  Collateral Involvement: No data recorded  Does Patient Have a Court  Appointed Legal Guardian? No data recorded Name and Contact of Legal Guardian: No data recorded If Minor and Not Living with Parent(s), Who has Custody? No data recorded Is CPS involved or ever been involved? Never  Is APS involved or  ever been involved? Never   Patient Determined To Be At Risk for Harm To Self or Others Based on Review of Patient Reported Information or Presenting Complaint? No  Method: No data recorded Availability of Means: No data recorded Intent: No data recorded Notification Required: No data recorded Additional Information for Danger to Others Potential: No data recorded Additional Comments for Danger to Others Potential: No data recorded Are There Guns or Other Weapons in Your Home? No data recorded Types of Guns/Weapons: No data recorded Are These Weapons Safely Secured?                            No data recorded Who Could Verify You Are Able To Have These Secured: No data recorded Do You Have any Outstanding Charges, Pending Court Dates, Parole/Probation? No data recorded Contacted To Inform of Risk of Harm To Self or Others: No data recorded  Location of Assessment: No data recorded  Does Patient Present under Involuntary Commitment? 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 sleep 6-7 hours)   Duration of Depressive symptoms: Greater than two weeks   Mania:  Irritability   Anxiety:   Difficulty concentrating; Irritability; Worrying; Sleep   Psychosis:  None   Duration of Psychotic symptoms: No data recorded  Trauma:  None   Obsessions:  None   Compulsions:  None   Inattention:  None   Hyperactivity/Impulsivity:  N/A   Oppositional/Defiant Behaviors:  Angry; Argumentative; Temper   Emotional Irregularity:  Mood lability   Other Mood/Personality Symptoms:  No data recorded   Mental Status Exam Appearance and self-care  Stature:  Average   Weight:  Average weight   Clothing:  Casual   Grooming:  Normal   Cosmetic use:  None   Posture/gait:  Normal   Motor activity:  Not Remarkable   Sensorium  Attention:  Normal   Concentration:  Anxiety interferes   Orientation:  X5   Recall/memory:  Normal   Affect and Mood  Affect:  Appropriate   Mood:  Anxious; Angry   Relating  Eye contact:  Normal   Facial expression:  Responsive   Attitude toward examiner:  Cooperative   Thought and Language  Speech flow: Clear and Coherent   Thought content:  Appropriate to Mood and Circumstances  Preoccupation:  None   Hallucinations:  None   Organization:  No data recorded  [employer] of Knowledge:  Good   Intelligence:  Above Average   Abstraction:  Normal   Judgement:  Normal   Reality Testing:  Adequate   Insight:  Good   Decision Making:  Normal   Social Functioning  Social Maturity:  Responsible   Social Judgement:  Normal   Stress  Stressors:  Family conflict; Transitions   Coping Ability:  Programme researcher, broadcasting/film/video Deficits:  No data recorded  Supports:  Family     Religion: Religion/Spirituality Are You A Religious Person?: Yes  Leisure/Recreation:     Exercise/Diet: Exercise/Diet Do You Exercise?: No Have You Gained or Lost A Significant Amount of Weight in the Past Six Months?: No Do You Follow a Special Diet?: No Do You Have Any Trouble Sleeping?: Yes Explanation of Sleeping Difficulties: takes sleeping pills (Seroquel)   CCA Employment/Education Employment/Work Situation: Employment / Work Copywriter, advertising Employment situation: Retired Archivist job has been impacted by current illness: No What is the longest time patient has a held a job?: 50yrs 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 [place] patient ever been in the [employer]?: No  Education: Education Name of [place]: [employer] in [place] Did You Graduate From [employer]?: Yes Did Physicist, medical?: Yes What Type of College Degree Do you Have?: BA in Therapist, occupational Pre-Law in 1974 Did [place]?: No Did You Have An Individualized Education Program (IIEP): No Did You Have Any Difficulty At [employer]?: No Patient's Education Has Been Impacted by Current Illness: No   CCA Family/Childhood History Family and Relationship History: Family history Are you sexually active?: No What is your sexual orientation?: heterosexual Has your sexual activity been affected by drugs, alcohol, medication, or emotional stress?: denies Does patient have children?: Yes  Childhood History:  Childhood History By whom was/is the patient raised?: Both parents Additional childhood history information: limited income as a child, teased as a child, fearful of his father, mother loving Description of patient's relationship with caregiver when they were a child: fearful of father; mother loving How were you disciplined when you got in trouble as a child/adolescent?: timeout Did patient suffer any verbal/emotional/physical/sexual abuse as a child?: No Has patient ever been sexually abused/assaulted/raped as an  adolescent or adult?: No Witnessed domestic violence?: No Has patient been affected by domestic violence as an adult?: No  Child/Adolescent Assessment:     CCA Substance Use Alcohol/Drug Use: Alcohol / Drug Use Prescriptions: Seroquel History of alcohol / drug use?: Yes Substance #1 Name of Substance 1: etoh 1 - Age of First Use: 17 1 - Amount (size/oz): variable 1 - Frequency: daily 1 - Duration: 30+ years 1 - Last Use / Amount: Many years 1- Route of Use: oral/drink   ASAM's:  Six Dimensions of Multidimensional Assessment  Dimension 1:  Acute Intoxication and/or Withdrawal Potential:   Dimension 1:  Description of individual's past and current experiences of substance use and withdrawal: none  Dimension 2:  Biomedical Conditions and Complications:   Dimension 2:  Description of patient's biomedical conditions and  complications: none  Dimension 3:  Emotional, Behavioral, or Cognitive Conditions and Complications:  Dimension 3:  Description of emotional, behavioral, or cognitive conditions and complications: none  Dimension 4:  Readiness to Change:  Dimension 4:  Description of Readiness to Change criteria: none  Dimension  5:  Relapse, Continued use, or Continued Problem Potential:  Dimension 5:  Relapse, continued use, or continued problem potential critiera description: none  Dimension 6:  Recovery/Living Environment:  Dimension 6:  Recovery/Iiving environment criteria description: none  ASAM Severity Score: ASAM's Severity Rating Score: 0  ASAM Recommended Level of Treatment:     Substance use Disorder (SUD)    Recommendations for Services/Supports/Treatments: Recommendations for Services/Supports/Treatments Recommendations For Services/Supports/Treatments: Individual Therapy,Medication Management  DSM5 Diagnoses: Patient Active Problem List   Diagnosis Date Noted  . Prolonged Q-T interval on ECG 06/08/2020  . CAP (community acquired pneumonia) 05/30/2020  . Sensory  ataxia 05/09/2020  . Right leg pain 02/05/2020  . Impingement of left ankle joint   . Sleeping difficulty 08/24/2019  . Hiccups 08/24/2019  . Chronic pain of left ankle 02/27/2019  . Vitamin D deficiency 02/18/2019  . Left shoulder pain 02/02/2019  . Neuropathy 09/24/2018  . Lumbosacral radiculopathy 01/14/2018  . 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 Facet arthritis of lumbar region 01/14/2018  . Chronic pain syndrome 08/26/2017  . 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 Thyroid nodule 02/27/2017  . Fatty liver 02/27/2017  . Purpura ([place]) 01/29/2017  . Right hip pain 08/16/2016  . Lumbar herniated disc 02/03/2016  . Hypertriglyceridemia 12/09/2015  . Anemia 12/09/2015  . Falls 09/16/2015  . Barrett's esophagus 09/16/2015  . Chronic lumbar pain 09/16/2015  . Benign prostatic hyperplasia 09/16/2015  . Trochanteric bursitis of left hip 09/09/2015  . Headache 09/09/2015  . Difficulty in walking 06/01/2015  . Amnesia 06/01/2015  . HLD (hyperlipidemia) 02/23/2015  . DJD (degenerative joint disease) of knee 11/15/2014  . Hypertension   . Tremor, essential   . Primary localized osteoarthritis of right knee   . Anxiety and depression   . Absence of sensation 11/18/2013  . Tobacco abuse 11/18/2013  . Chronic diarrhea 11/12/2013  . Benign neoplasm of colon 08/25/2013  . 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 Type 2 diabetes mellitus ([place]) 10/06/2012    Patient Centered Plan: Patient is on the following Treatment Plan(s):  Anxiety and Depression   Referrals to Alternative Service(s): Referred to Alternative Service(s):   Place:   Date:   Time:    Referred to Alternative Service(s):   Place:   Date:   Time:    Referred to Alternative Service(s):   Place:   Date:   Time:    Referred to Alternative Service(s):   Place:   Date:   Time:     Rachel Bo Nakira Litzau, LCSW

## 2020-07-20 ENCOUNTER — Other Ambulatory Visit (INDEPENDENT_AMBULATORY_CARE_PROVIDER_SITE_OTHER): Payer: PPO

## 2020-07-20 ENCOUNTER — Other Ambulatory Visit: Payer: Self-pay

## 2020-07-20 DIAGNOSIS — N179 Acute kidney failure, unspecified: Secondary | ICD-10-CM

## 2020-07-20 LAB — BASIC METABOLIC PANEL
BUN: 28 mg/dL — ABNORMAL HIGH (ref 6–23)
CO2: 29 mEq/L (ref 19–32)
Calcium: 9.9 mg/dL (ref 8.4–10.5)
Chloride: 99 mEq/L (ref 96–112)
Creatinine, Ser: 1.21 mg/dL (ref 0.40–1.50)
GFR: 56.56 mL/min — ABNORMAL LOW (ref >60.00)
Glucose, Bld: 176 mg/dL — ABNORMAL HIGH (ref 70–99)
Potassium: 4.3 mEq/L (ref 3.5–5.1)
Sodium: 137 mEq/L (ref 135–145)

## 2020-07-21 ENCOUNTER — Encounter: Payer: PPO | Admitting: Student in an Organized Health Care Education/Training Program

## 2020-07-21 ENCOUNTER — Other Ambulatory Visit: Payer: Self-pay | Admitting: Family Medicine

## 2020-07-21 DIAGNOSIS — E782 Mixed hyperlipidemia: Secondary | ICD-10-CM

## 2020-07-24 ENCOUNTER — Other Ambulatory Visit: Payer: Self-pay | Admitting: Family Medicine

## 2020-07-26 ENCOUNTER — Ambulatory Visit (INDEPENDENT_AMBULATORY_CARE_PROVIDER_SITE_OTHER): Payer: PPO | Admitting: Pharmacist

## 2020-07-26 DIAGNOSIS — F419 Anxiety disorder, unspecified: Secondary | ICD-10-CM | POA: Diagnosis not present

## 2020-07-26 DIAGNOSIS — E1142 Type 2 diabetes mellitus with diabetic polyneuropathy: Secondary | ICD-10-CM

## 2020-07-26 DIAGNOSIS — Z720 Tobacco use: Secondary | ICD-10-CM

## 2020-07-26 DIAGNOSIS — F32A Depression, unspecified: Secondary | ICD-10-CM

## 2020-07-26 DIAGNOSIS — N400 Enlarged prostate without lower urinary tract symptoms: Secondary | ICD-10-CM

## 2020-07-26 NOTE — Patient Instructions (Signed)
Visit Information  PATIENT GOALS: Goals Addressed              This Visit's Progress     Patient Stated   .  Medication Monitoring (pt-stated)        Patient Goals/Self-Care Activities . Over the next 30 days, patient will:  - take medications as prescribed collaborate with provider on medication access solutions Remain tobacco free       Patient verbalizes understanding of instructions provided today and agrees to view in [place].   Plan: Telephone follow up appointment with care management team member scheduled for:  ~ 6 weeks  Catie Darnelle Maffucci, PharmD, [place], [place] Clinical Pharmacist [employer] at [employer] [phone]

## 2020-07-26 NOTE — Chronic Care Management (AMB) (Signed)
Chronic Care Management Pharmacy Note  07/26/2020 Name:  James Hood MRN:  [id number] DOB:  05/30/1940  Subjective: James Hood is an 80 y.o. year old male who is a primary patient of Caryl Bis, Angela Adam, MD.  The CCM team was consulted for assistance with disease management and care coordination needs.    Engaged with patient by telephone for follow up visit in response to provider referral for pharmacy case management and/or care coordination services.   Consent to Services:  The patient was given information about Chronic Care Management services, agreed to services, and gave verbal consent prior to initiation of services.  Please see initial visit note for detailed documentation.   Patient Care Team: Leone Haven, MD as PCP - General (Family Medicine) De Hollingshead, RPH-CPP as Pharmacist (Pharmacist)  Recent office visits:  2/15 - PCP visit for f/u on pneumonia. Improved. Referral to urology.   Recent consult visits:  2/16 - neurology Dr. Melrose Nakayama, encouraged to continue to stay active physically and mentally. Continued therapy.  2/24 - urology Dr. Diamantina Providence; given sample of Myrbetriq for trial  2/28 - psychiatry Dr. Modesta Messing, continued current regimen  3/1 - counselor [name], [place] [facility] visits:  1/19 - ED visit for hypoxia, improved  1/10-1/12 - hospitalization for pneumonia and sepsis  Objective:  Lab Results  Component Value Date   CREATININE 1.21 07/20/2020   BUN 28 (H) 07/20/2020   GFR 56.56 (L) 07/20/2020   GFRNONAA 55 (L) 06/08/2020   GFRAA >60 09/25/2019   NA 137 07/20/2020   K 4.3 07/20/2020   CALCIUM 9.9 07/20/2020   CO2 29 07/20/2020    Lab Results  Component Value Date/Time   HGBA1C 6.7 (H) 05/09/2020 09:47 AM   HGBA1C 6.4 (A) 02/05/2020 11:17 AM   HGBA1C 7.8 (A) 10/26/2019 08:41 AM   HGBA1C 7.7 (H) 05/25/2019 08:28 AM   GFR 56.56 (L) 07/20/2020 08:46 AM   GFR 43.72 (L) 07/05/2020 12:09 PM    Last diabetic  Eye exam:  Lab Results  Component Value Date/Time   HMDIABEYEEXA No Retinopathy 01/27/2020 03:37 PM    Last diabetic Foot exam: No results found for: HMDIABFOOTEX   Lab Results  Component Value Date   CHOL 99 07/05/2020   HDL 43.20 07/05/2020   LDLCALC 57 09/25/2017   LDLDIRECT 31.0 07/05/2020   TRIG 226.0 (H) 07/05/2020   CHOLHDL 2 07/05/2020    Hepatic Function Latest Ref Rng & Units 06/08/2020 05/31/2020 05/30/2020  Total Protein 6.5 - 8.1 g/dL 6.9 5.8(L) 7.0  Albumin 3.5 - 5.0 g/dL 3.3(L) 2.8(L) 3.6  AST 15 - 41 U/L 29 19 26   ALT 0 - 44 U/L 19 14 19   Alk Phosphatase 38 - 126 U/L 58 32(L) 35(L)  Total Bilirubin 0.3 - 1.2 mg/dL 0.8 0.7 1.2  Bilirubin, Direct 0.0 - 0.3 mg/dL - - -    Lab Results  Component Value Date/Time   TSH 0.346 (L) 05/31/2020 12:34 AM   TSH 1.36 07/20/2019 09:02 AM   TSH 1.01 02/02/2019 12:15 PM    CBC Latest Ref Rng & Units 06/08/2020 06/01/2020 05/31/2020  WBC 4.0 - 10.5 K/uL 6.5 6.7 11.1(H)  Hemoglobin 13.0 - 17.0 g/dL 11.8(L) 10.2(L) 10.4(L)  Hematocrit 39.0 - 52.0 % 35.7(L) 31.0(L) 31.2(L)  Platelets 150 - 400 K/uL 375 203 202    Lab Results  Component Value Date/Time   VD25OH 42.83 04/07/2019 08:54 AM   VD25OH 24.06 (L) 03/13/2019 10:07 AM  Clinical ASCVD: Yes    Depression screen [name] 2/9 07/18/2020 05/09/2020 03/01/2020  Decreased Interest 1 0 0  Down, Depressed, Hopeless 1 0 0  PHQ - 2 Score 2 0 0  Altered sleeping 1 - -  Tired, decreased energy 0 - -  Change in appetite 0 - -  Feeling bad or failure about yourself  0 - -  Trouble concentrating 1 - -  Moving slowly or fidgety/restless 0 - -  Suicidal thoughts 0 - -  PHQ-9 Score 4 - -  Difficult doing work/chores Somewhat difficult - -  Some recent data might be hidden     Social History   Tobacco Use  Smoking Status Former Smoker  . Packs/day: 0.50  . Years: 60.00  . Pack years: 30.00  . Types: Cigarettes  Smokeless Tobacco Never Used   BP Readings from Last 3  Encounters:  07/18/20 116/70  07/14/20 112/72  07/05/20 120/70   Pulse Readings from Last 3 Encounters:  07/18/20 81  07/14/20 86  07/05/20 72   Wt Readings from Last 3 Encounters:  07/18/20 170 lb (77.1 kg)  07/14/20 170 lb (77.1 kg)  07/05/20 170 lb (77.1 kg)    Assessment/Interventions: Review of patient past medical history, allergies, medications, health status, including review of consultants reports, laboratory and other test data, was performed as part of comprehensive evaluation and provision of chronic care management services.   SDOH:  (Social Determinants of Health) assessments and interventions performed: Yes SDOH Interventions   Flowsheet Row Most Recent Value  SDOH Interventions   SDOH Interventions for the Following Domains Tobacco  Financial Strain Interventions Other (Comment)  [manufacturer assistance]  Tobacco Interventions Cessation Materials Given and Reviewed      CCM Care Plan  No Known Allergies  Medications Reviewed Today    Reviewed by De Hollingshead, RPH-CPP (Pharmacist) on 07/26/20 at 8  Med List Status: <None>  Medication Order Taking? Sig Documenting Provider Last Dose Status Informant  aspirin EC 81 MG tablet [id number]  Take 81 mg by mouth daily. [provider]  Active            Med Note Vanessa Dover, SUSAN   Tue Apr 15, 2018  8:47 AM)    budesonide (ENTOCORT EC) 3 MG 24 hr capsule [id number]  Take 9 mg by mouth daily. [provider]  Active   buPROPion [facility] SR) 150 MG 12 hr tablet [id number]  TAKE ONE TABLET BY MOUTH TWICE A DAY Leone Haven, MD  Active   buPROPion (WELLBUTRIN XL) 300 MG 24 hr tablet [id number] Yes Take 1 tablet (300 mg total) by mouth daily. Norman Clay, MD Taking Active   Continuous Blood Gluc Receiver (FREESTYLE LIBRE 14 DAY READER) DEVI [id number] Yes USE DAILY AS DIRECED TO CHECK BLOOD GLUCOSE Leone Haven, MD Taking Active   Continuous Blood Gluc Sensor (FREESTYLE LIBRE 14 DAY  SENSOR) MISC [id number]  APPLY ONE SENSOR EVERY 14 DAYS TO MONITOR BLOOD GLUCOSE LEVELS.  [place] Leone Haven, MD  Active   diphenoxylate-atropine (LOMOTIL) 2.5-0.025 MG tablet [id number] Yes Take 1 tablet by mouth 4 (four) times daily as needed. [provider] Taking Active Pharmacy Records  divalproex (DEPAKOTE ER) 500 MG 24 hr tablet [id number] Yes Take 1 tablet by mouth 2 (two) times daily. [provider] Taking Active   donepezil (ARICEPT) 10 MG tablet [id number] Yes Take 10 mg by mouth at bedtime. [provider] Taking Active Self  fenofibrate (TRICOR) 145 MG tablet [id number] Yes TAKE ONE TABLET BY MOUTH DAILY Leone Haven, MD Taking Active   gabapentin (NEURONTIN) 600 MG tablet [id number] Yes TAKE TWO TABLETS BY MOUTH THREE TIMES A DAY Leone Haven, MD Taking Active   HYDROcodone-acetaminophen [facility]) 10-325 MG tablet [id number]  Take 1 tablet by mouth every 4 (four) hours as needed for severe pain. Must last 30 days. [name], MD  Active   icosapent Ethyl (VASCEPA) 1 g capsule [id number] Yes Take 2 g by mouth 2 (two) times daily. [provider] Taking Active Pharmacy Records  JARDIANCE 25 MG TABS tablet [id number] Yes TAKE ONE TABLET BY MOUTH DAILY Leone Haven, MD Taking Active   lisinopril-hydrochlorothiazide (ZESTORETIC) 20-25 MG tablet [id number] Yes TAKE ONE TABLET BY MOUTH TWICE A DAY Leone Haven, MD Taking Active   memantine (NAMENDA) 10 MG tablet [id number] Yes TAKE ONE TABLET BY MOUTH TWICE A DAY [provider] Taking Active   metoprolol succinate (TOPROL-XL) 25 MG 24 hr tablet [id number] Yes Take 25 mg by mouth daily. [provider] Taking Active   mirabegron ER (MYRBETRIQ) 50 MG TB24 tablet [id number] Yes Take 1 tablet (50 mg total) by mouth daily. Billey Co, MD Taking Active   Multiple Vitamins-Minerals (CENTRUM SILVER PO) [id number] Yes Take 1 tablet by  mouth daily. [provider] Taking Active   nicotine (NICODERM CQ - DOSED IN MG/24 HOURS) 14 mg/24hr patch [id number] No Place 1 patch (14 mg total) onto the skin daily.  Patient not taking: Reported on 07/26/2020   Leone Haven, MD Not Taking Active   nicotine (NICODERM CQ - DOSED IN MG/24 HR) 7 mg/24hr patch [id number] No Place 1 patch (7 mg total) onto the skin daily. Start 7 mg patch after completing the 14 mg patch.  Patient not taking: Reported on 07/26/2020   Leone Haven, MD Not Taking Active   omeprazole (PRILOSEC) 20 MG capsule [id number] Yes Take 20 mg by mouth 2 (two) times daily before a meal.  [provider] Taking Active Self           Med Note Josiah Lobo, KIMBERLY   Thu Aug 09, 2016 12:05 PM)    primidone (MYSOLINE) 50 MG tablet [id number] Yes Take 150-250 mg by mouth 2 (two) times daily. Take 250 mg by mouth in the morning & take 150 mg at night. [provider] Taking Active Self  QUEtiapine (SEROQUEL) 100 MG tablet [id number] Yes Take 150 mg by mouth at bedtime. [provider] Taking Active Pharmacy Records  rosuvastatin (CRESTOR) 40 MG tablet [id number] Yes TAKE ONE TABLET BY MOUTH DAILY Leone Haven, MD Taking Active   Semaglutide,0.25 or 0.5MG/DOS, (OZEMPIC, 0.25 OR 0.5 MG/DOSE,) 2 MG/1.5ML SOPN [id number] Yes Inject 0.5 mg into the skin once a week. Leone Haven, MD Taking Active Pharmacy Records  tamsulosin [facility]) 0.4 MG CAPS capsule [id number] Yes TAKE ONE CAPSULE BY MOUTH DAILY AFTER Shawna Clamp, Randell Patient, MD Taking Active   traZODone (DESYREL) 50 MG tablet [id number] Yes 25-50 mg at night as needed for sleep Norman Clay, MD Taking Active   Med List Note [name], RN 05/26/20 1109): MR 3/11-22 UDS 10-06-2019          Patient Active Problem List   Diagnosis Date Noted  . Prolonged Q-T interval on ECG 06/08/2020  . CAP (community acquired pneumonia) 05/30/2020  . 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 Right leg pain  02/05/2020  . Impingement of left ankle  joint   . Sleeping difficulty 08/24/2019  . Hiccups 08/24/2019  . Chronic pain of left ankle 02/27/2019  . Vitamin D deficiency 02/18/2019  . Left shoulder pain 02/02/2019  . Neuropathy 09/24/2018  . Lumbosacral radiculopathy 01/14/2018  . 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 Facet arthritis of lumbar region 01/14/2018  . Chronic pain syndrome 08/26/2017  . 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 Thyroid nodule 02/27/2017  . Fatty liver 02/27/2017  . Purpura ([place]) 01/29/2017  . Right hip pain 08/16/2016  . Lumbar herniated disc 02/03/2016  . Hypertriglyceridemia 12/09/2015  . Anemia 12/09/2015  . Falls 09/16/2015  . Barrett's esophagus 09/16/2015  . Chronic lumbar pain 09/16/2015  . Benign prostatic hyperplasia 09/16/2015  . Trochanteric bursitis of left hip 09/09/2015  . Headache 09/09/2015  . Difficulty in walking 06/01/2015  . Amnesia 06/01/2015  . HLD (hyperlipidemia) 02/23/2015  . DJD (degenerative joint disease) of knee 11/15/2014  . Hypertension   . Tremor, essential   . Primary localized osteoarthritis of right knee   . Anxiety and depression   . Absence of sensation 11/18/2013  . Tobacco abuse 11/18/2013  . Chronic diarrhea 11/12/2013  . Benign neoplasm of colon 08/25/2013  . 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 Type 2 diabetes mellitus ([place]) 10/06/2012    Immunization History  Administered Date(s) Administered  . Fluad Quad(high Dose 65+) 02/05/2020  . Hepatitis A, Adult 12/02/2018  . Influenza, High Dose Seasonal PF 01/29/2017, 03/19/2018, 01/10/2019  . Influenza,inj,Quad PF,6+ Mos 03/13/2016  . Influenza-Unspecified 01/21/2015  . PFIZER(Purple Top)SARS-COV-2 Vaccination 06/01/2019, 06/22/2019  . Pneumococcal Conjugate-13 02/07/2015  . Pneumococcal Polysaccharide-23 01/18/2018  . Tdap 09/16/2015, 01/18/2018  . Zoster 09/16/2015  . Zoster Recombinat (Shingrix) 01/03/2018, 05/09/2018     Conditions to be addressed/monitored:  Hypertension, Hyperlipidemia, Diabetes, Depression and tobacco use  Care Plan : Medication Management  Updates made by De Hollingshead, RPH-CPP since 07/26/2020 12:00 AM    Problem: Diabetes, Depression     Long-Range Goal: Disease Progression Prevention   Start Date: 02/16/2020  Recent Progress: On track  Priority: Medium  Note:   Current Barriers:  . Unable to independently afford treatment regimen  Pharmacist Clinical Goal(s):  Marland Kitchen Over the next 30 days, patient will verbalize ability to afford treatment regimen through collaboration with PharmD and provider.  . Over the next 90 days, patient will maintain glycemic control as evidenced by A1c through collaboration with PharmD and provider.  Interventions: . 1:1 collaboration with Leone Haven, MD regarding development and update of comprehensive plan of care as evidenced by provider attestation and co-signature . Inter-disciplinary care team collaboration (see longitudinal plan of care) . Comprehensive medication review performed; medication list updated in electronic medical record   Diabetes: . Controlled; current treatment: Jardiance 25 mg daily, Ozempic 0.5 mg weekly (max tolerated dose d/t GI upset/weight loss at 1 mg weekly) . Patient is concerned today about occasional episodes of glucose >200  o Hx metformin ER - significant diarrhea that resolved upon discontinuation . Over income for [place] assistance, though approved for Ozempic assistance for 2022 . Current glucose readings: using Libre 2 CGM Date of Download: 07/26/20 Average Glucose: 146 mg/dL in the past 14 days Midnight - 6 am: 125; 6 am-noon:154; noon - 6 pm: 180; 6 pm- midnight: 164 Time in Target:   - Time in range 70-180: 74% - Time above range: 24% - Time below range: 1% Observed patterns: per patient logbook report, occasional elevated readings . Current meal patterns:  wife is on a diet, so he is  having to eat for himself; breakfast: goes out to [employer] (egg, cheese, sausage, grits bowl w/ butter; sometimes egg, sausage, toast, waffle, grits), [employer] (pork tenderloin w/ 2 eggs, grits, gravy, biscuit) or [employer] (sausage, egg and cheese biscuits); drink: coffee w/ cream; lunch: frozen entree (spaghetti and meatballs today) or [place] dish (cheese enchilada with beans and corn); soda water w/ flavor packet,  . Reviewed goals regarding time in range, especially considering additional comorbidities.  . Extensive discussion about current dietary choices. Encouraged choosing to eat breakfast at home some days. He notes he can make oatmeal at home. Also discussed minimizing carbohydrate choices when eating out (not eating grits + biscuit + toast). Discussed incorporating more vegetables, suggested frozen vegetable mixes that are just as easy to fix as frozen carbohydrate-filled entrees. Patient verbalized understanding and agreement to make some dietary adjustments.   Hx tobacco use: Marland Kitchen Patient reports he quit smoking after hospitalization, but has restarted about 4-6 weeks ago; notes that he called the [facility] Quit Line, was sent patches and lozenges. Has decided that when he finishes this carton he is going to quit. His wife smokes too, he thinks she is going to try to quit as well.  . Notes that he finally had the motivation to quit smoking after hospitalization for pneumonia that he knows was exacerbated by tobacco use.  Marland Kitchen Also bought flavored nicotine patches. We discussed that this is not as safe/regulated as official nicotine replacement therapy. Encouraged use of patches daily + lozenges throughout the day for breakthrough cravings. Encouraged to keep patch on for 24 hours, but that he can remove overnight if he develops nightmares.  . Provided significant encouragement to adhere to his goal of tobacco cessation   Hypertension: . Controlled per last clinic reading; current treatment:  lisinopril/HCTZ 20/25 mg daily, metoprolol succinate 25 mg daily . Recommended to continue current regimen  Hyperlipidemia: . Controlled; current treatment: rosuvastatin 40 mg daily, fenofibrate 145 mg daily, Vascepa 2 g BID;  o [place] for [employer] . Recommended to continue current regimen. . Recommend updated lipid panel at next lab work  Depression: . Improving. Current regimen: bupropion XL 300 mg daily. Follows w/ Dr. Modesta Messing and LCSW . Reports he really enjoyed his first session w/ therapist. Is looking forward to continuing to work with her.  . Reminded to ensure he is taking bupropion in the morning . Continue current regimen along with psychiatry collaboration at this time.   Chronic Pain w/ recent ankle surgery: . Improved per patient report; oxycodone/APAP 5/325 mg up to TID, gabapenin 1200 mg TID. Follows w/ Dr. Holley Raring . Reviewed upcoming f/u.  Marland Kitchen Recommend to continue collaboration w/ Pain Management  Neurologic Conditions (pseudo dementia, tremor, sleep disorder) . Well managed per patient report; current regimen: memantine 10 mg BID, donepezil 10 mg daily; primidone 250 mg QAM, 150 mg QPM; divalproex 500 mg BID, quetiapine 150 mg QPM; Follows w/ Dr. Melrose Nakayama.  . Recommended to continue current regimen at this time along with collaboration w/ Neurology . Discussed that these conditions put at higher risk of falls, so minimizing medication decisions that can worsen fall risk should be a goal.  Chronic Diarrhea/Colitis: . Controlled per patient report; current regimen: budesonide 9 mg daily, Lomotil 2 tabs up to QID PRN; follows w/ [facility] . Recommended to continue current regimen at this time along with collaboration w/ GI. Reviewed upcoming appointment  OAB: . Improved; current  treatment regimen: Mybetriq 50 mg daily; follows w/ Dr. Diamantina Providence . Continue current regimen along with collaboration with urology at this time   Patient Goals/Self-Care  Activities . Over the next 90 days, patient will:  - take medications as prescribed check glucose at least 3 times daily using CGM, document, and provide at future appointments collaborate with provider on medication access solutions engage in dietary modifications by reducing carbohydrate portion sizes Focus on tobacco cessation  Follow Up Plan: Telephone follow up appointment with care management team member scheduled for: ~ 6 weeks      Medication Assistance: Ozempic obtained through [employer] medication assistance program.  Enrollment ends 05/20/21  Patient's preferred pharmacy is:  [facility], [place] [address] [place] [address] Phone: [phone] Fax: [phone]  [id number] [name], [place] [address] Phone: [phone] Fax: [phone]  [facility] [address], [place] - [place] [address] Phone: [phone] Fax: [phone]  Care Plan and Follow Up Patient Decision:  Patient agrees to Care Plan and Follow-up.  Plan: Telephone follow up appointment with care management team member scheduled for:  ~ 6 weeks  Catie Darnelle Maffucci, PharmD, [place], [place] Clinical Pharmacist [employer] at [employer] [phone]

## 2020-07-27 ENCOUNTER — Ambulatory Visit
Payer: PPO | Attending: Student in an Organized Health Care Education/Training Program | Admitting: Student in an Organized Health Care Education/Training Program

## 2020-07-27 ENCOUNTER — Encounter: Payer: Self-pay | Admitting: Student in an Organized Health Care Education/Training Program

## 2020-07-27 ENCOUNTER — Other Ambulatory Visit: Payer: Self-pay

## 2020-07-27 VITALS — BP 97/60 | HR 66 | Temp 96.4°F | Resp 16 | Ht 70.0 in | Wt 165.0 lb

## 2020-07-27 DIAGNOSIS — G8929 Other chronic pain: Secondary | ICD-10-CM | POA: Diagnosis not present

## 2020-07-27 DIAGNOSIS — T85192S Other mechanical complication of implanted electronic neurostimulator (electrode) of spinal cord, sequela: Secondary | ICD-10-CM | POA: Diagnosis not present

## 2020-07-27 DIAGNOSIS — M19072 Primary osteoarthritis, left ankle and foot: Secondary | ICD-10-CM | POA: Insufficient documentation

## 2020-07-27 DIAGNOSIS — M47816 Spondylosis without myelopathy or radiculopathy, lumbar region: Secondary | ICD-10-CM | POA: Diagnosis not present

## 2020-07-27 DIAGNOSIS — F1190 Opioid use, unspecified, uncomplicated: Secondary | ICD-10-CM

## 2020-07-27 DIAGNOSIS — Z720 Tobacco use: Secondary | ICD-10-CM | POA: Insufficient documentation

## 2020-07-27 DIAGNOSIS — M5417 Radiculopathy, lumbosacral region: Secondary | ICD-10-CM | POA: Diagnosis not present

## 2020-07-27 DIAGNOSIS — G894 Chronic pain syndrome: Secondary | ICD-10-CM | POA: Diagnosis not present

## 2020-07-27 DIAGNOSIS — M5386 Other specified dorsopathies, lumbar region: Secondary | ICD-10-CM | POA: Diagnosis not present

## 2020-07-27 DIAGNOSIS — M5442 Lumbago with sciatica, left side: Secondary | ICD-10-CM | POA: Insufficient documentation

## 2020-07-27 MED ORDER — ALPHA-LIPOIC ACID 600 MG PO CAPS: 600.0000 mg | ORAL_CAPSULE | Freq: Every day | ORAL | 2 refills | Status: AC

## 2020-07-27 MED ORDER — HYDROCODONE-ACETAMINOPHEN 10-325 MG PO TABS: 1.0000 | ORAL_TABLET | ORAL | 0 refills | Status: AC | PRN

## 2020-07-27 NOTE — Progress Notes (Signed)
Nursing Pain Medication Assessment:  Safety precautions to be maintained throughout the outpatient stay will include: orient to surroundings, keep bed in low position, maintain call bell within reach at all times, provide assistance with transfer out of bed and ambulation.  Medication Inspection Compliance: Mr. Kozakiewicz did not comply with our request to bring his pills to be counted. He was reminded that bringing the medication bottles, even when empty, is a requirement.  Medication: None brought in. Pill/Patch Count: None available to be counted. Bottle Appearance: No container available. Did not bring bottle(s) to appointment. Filled Date: N/A Last Medication intake:  Today  Reminded to bring bottles to appointment

## 2020-07-27 NOTE — Progress Notes (Signed)
PROVIDER NOTE: Information contained herein reflects review and annotations entered in association with encounter. Interpretation of such information and data should be left to medically-trained personnel. Information provided to patient can be located elsewhere in the medical record under "Patient Instructions". Document created using STT-dictation technology, any transcriptional errors that may result from process are unintentional.    Patient: James Hood  Service Category: E/M  Provider: [name], MD  DOB: 01/28/1941  DOS: 07/27/2020  Specialty: Interventional Pain Management  MRN: [id number]  Setting: Ambulatory outpatient  PCP: Leone Haven, MD  Type: Established Patient    Referring Provider: Leone Haven, MD  Location: Office  Delivery: Face-to-face     HPI  [name], a 80 y.o. 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 [name] primary complain today is Ankle Pain (left) Last encounter: My last encounter with him was on 07/21/2020. Pertinent problems: [name] Primary localized osteoarthritis of right knee; DJD (degenerative joint disease) of knee; Chronic lumbar pain; Lumbar herniated disc; Chronic pain syndrome; Failed back surgical syndrome; Lumbosacral radiculopathy; Facet arthritis of lumbar region; Neuropathy; Chronic pain of left ankle; and Impingement of left ankle joint on their pertinent problem list. Pain Assessment: Severity of Chronic pain is reported as a 6 /10. Location: Ankle Left/radiates up leg to mid calf. Onset: More than a month ago. Quality: Burning,Aching. Timing: Constant. Modifying factor(s): nothing. Vitals:  height is _0  (1.778 m) and weight is 165 lb (74.8 kg). His temporal temperature is 96.4 F (35.8 C) (abnormal). His blood pressure is 97/60 and his pulse is 66. His respiration is 16 and oxygen saturation is 96%.   Reason for encounter: medication management.   No  change in medical history since last visit.  Patient's pain is at baseline.  Patient continues multimodal pain regimen as prescribed.  States that it provides pain relief and improvement in functional status. Patient is on maximum dose gabapentin 1200 mg 3 times a day.  Continues to endorse neuropathic pain.  We discussed the addition of alpha lipoic acid, 600 mg daily.  Pharmacotherapy Assessment   /18/2022  05/26/2020   1  Hydrocodone-Acetamin 10-325 Mg  180.00  30  Bi Lat  [id number]  Har (0034)  0/0  60.00 MME  Medicare  [place]      Analgesic: Hydrocodone 10 mg every 4 hours as needed, quantity 180/month; MME equals 60    Monitoring: [place] PMP: PDMP not reviewed this encounter.       Pharmacotherapy: No side-effects or adverse reactions reported. Compliance: No problems identified. Effectiveness: Clinically acceptable.  Dewayne Shorter, RN  07/27/2020  9:03 AM  Signed Nursing Pain Medication Assessment:  Safety precautions to be maintained throughout the outpatient stay will include: orient to surroundings, keep bed in low position, maintain call bell within reach at all times, provide assistance with transfer out of bed and ambulation.  Medication Inspection Compliance: [name] did not comply with our request to bring his pills to be counted. He was reminded that bringing the medication bottles, even when empty, is a requirement.  Medication: None brought in. Pill/Patch Count: None available to be counted. Bottle Appearance: No container available. Did not bring bottle(s) to appointment. Filled Date: N/A Last Medication intake:  Today  Reminded to bring bottles to appointment     UDS:  Summary  Date Value Ref Range Status  10/06/2019 Note  Final    Comment:    ====================================================================  [facility] Select 13 (MW) ==================================================================== Test                             Result       Flag        Units Drug Present   Oxycodone                      900                     ng/mg creat   Oxymorphone                    174                     ng/mg creat   Noroxycodone                   1620                    ng/mg creat    Sources of oxycodone include scheduled prescription medications.    Oxymorphone and noroxycodone are expected metabolites of oxycodone.    Oxymorphone is also available as a scheduled prescription medication.   Phenobarbital                  PRESENT    Phenobarbital may be administered as a prescription drug; it is also    an expected metabolite of primidone and mephobarbital. ==================================================================== Test                      Result    Flag   Units      Ref Range   Creatinine              35               mg/dL      >=20 ==================================================================== Declared Medications:  Medication list was not provided. ==================================================================== For clinical consultation, please call [phone]. ====================================================================      ROS  Constitutional: Denies any fever or chills Gastrointestinal: No reported hemesis, hematochezia, vomiting, or acute GI distress Musculoskeletal: Left ankle pain Neurological: No reported episodes of acute onset apraxia, aphasia, dysarthria, agnosia, amnesia, paralysis, loss of coordination, or loss of consciousness  Medication Review  Alpha-Lipoic Acid, FreeStyle Libre 14 Day Reader, FreeStyle Libre 14 Day Sensor, HYDROcodone-acetaminophen, Multiple Vitamins-Minerals, QUEtiapine, Semaglutide(0.25 or 0.5MG /DOS), aspirin EC, buPROPion, budesonide, diphenoxylate-atropine, divalproex, donepezil, empagliflozin, fenofibrate, finasteride, gabapentin, icosapent Ethyl, lisinopril-hydrochlorothiazide, memantine, metoprolol succinate, mirabegron ER, nicotine, omeprazole, primidone,  rosuvastatin, tamsulosin, and traZODone  History Review  Allergy: James Hood Hood No Known Allergies. Drug: James Hood  reports no history of drug use. Alcohol:  reports previous alcohol use. Tobacco:  reports that he Hood quit smoking. His smoking use included cigarettes. He Hood a 30.00 pack-year smoking history. He Hood never used smokeless tobacco. Social: Mr. Mcclanahan  reports that he Hood quit smoking. His smoking use included cigarettes. He Hood a 30.00 pack-year smoking history. He Hood never used smokeless tobacco. He reports previous alcohol use. He reports that he does not use drugs. 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 Surgical: Mr. Marbach  Hood a past surgical history that includes esophageal stretch; Tonsillectomy; Total knee arthroplasty (Right, 11/15/2014); Lumbar laminectomy/decompression microdiscectomy (Left, 02/03/2016); Colonoscopy with propofol (N/A, 09/14/2016); Joint replacement (Right, 2016); Pulse generator implant (N/A, 08/21/2017); Back surgery; Pulse generator implant (Right, 04/02/2018); and Ankle arthroscopy (Left, 09/29/2019). Family: family history includes Depression in his father; Heart attack in his mother; Heart disease in his mother; Hypertension in his father.  Laboratory Chemistry Profile   Renal Lab Results  Component Value Date   BUN 28 (H) 07/20/2020   CREATININE 1.21  07/20/2020   GFR 56.56 (L) 07/20/2020   GFRAA >60 09/25/2019   GFRNONAA 55 (L) 06/08/2020     Hepatic Lab Results  Component Value Date   AST 29 06/08/2020   ALT 19 06/08/2020   ALBUMIN 3.3 (L) 06/08/2020   ALKPHOS 58 06/08/2020   AMMONIA 14 08/09/2016     Electrolytes Lab Results  Component Value Date   NA 137 07/20/2020   K 4.3 07/20/2020   CL 99 07/20/2020   CALCIUM 9.9 07/20/2020   MG 2.2 06/01/2020     Bone Lab Results  Component Value Date   VD25OH 42.83 04/07/2019     Inflammation (CRP: Acute Phase) (ESR: Chronic Phase) Lab Results  Component Value Date   ESRSEDRATE 15 07/20/2019   LATICACIDVEN 1.9 06/08/2020       Note: Above Lab results reviewed.  Recent Imaging Review  MR Brain Wo Contrast CLINICAL DATA:  Ataxia  EXAM: MRI HEAD WITHOUT CONTRAST  TECHNIQUE: Multiplanar, multiecho pulse sequences of the brain and surrounding structures were obtained without intravenous contrast.  COMPARISON:  None.  FINDINGS: Brain: No acute infarct, mass effect or extra-axial collection. No acute or chronic hemorrhage. There is multifocal hyperintense T2-weighted signal within the white matter. Advanced atrophy for age. The midline structures are normal.  Vascular: Major flow voids are preserved.  Skull and upper cervical spine: Normal calvarium and skull base. Visualized upper cervical spine and soft tissues are normal.  Sinuses/Orbits:No paranasal sinus fluid levels or advanced mucosal thickening. No mastoid or middle ear effusion. Normal orbits.  IMPRESSION: 1. No acute intracranial abnormality. 2. 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  Electronically Signed   By: Ulyses Jarred M.D.   On: 07/12/2020 03:40 Note: Reviewed        Physical Exam  General appearance: Well nourished, well developed, and well hydrated. In no apparent acute distress Mental status: Alert, oriented x 3 (person, place, & time)       Respiratory: No  evidence of acute respiratory distress Eyes: PERLA Vitals: BP 97/60 (BP Location: Left Arm, Patient Position: Sitting, Cuff Size: Large)   Pulse 66   Temp (!) 96.4 F (35.8 C) (Temporal)   Resp 16   Ht _0  (1.778 m)   Wt 165 lb (74.8 kg)   SpO2 96%   BMI 23.68 kg/m  BMI: Estimated body mass index is 23.68 kg/m as calculated from the following:   Height as of this encounter: _1  (1.778 m).   Weight as of this encounter: 165 lb (74.8 kg). Ideal: Ideal body weight: 73 kg (160 lb 15 oz) Adjusted ideal body weight: 73.7 kg (162 lb 9 oz)   Lumbar Exam  Skin & Axial Inspection:Well healed scar from previous spine surgery detected  Alignment:Symmetrical Functional HYW:VPXTGGYIRS ROM Stability:No instability detected Muscle Tone/Strength:Functionally intact. No obvious neuro-muscular anomalies detected. Sensory (Neurological):Dermatomal pain pattern  Gait & Posture Assessment  Ambulation:Limited Gait:Limited. Using assistive device to ambulate Posture:Difficulty standing up straight, due to pain  Lower Extremity Exam    Side:Right lower extremity  Side:Left lower extremity  Stability:No instability observed  Stability:No instability observed  Skin & Extremity Inspection:Skin color, temperature, and hair growth are WNL. No peripheral edema or cyanosis. No masses, redness, swelling, asymmetry, or associated skin lesions. No contractures.  Skin & Extremity Inspection:Evidence of prior arthroplastic surgery  Functional WNI:OEVOJ leg pain, right ankle pain   Functional JKK:XFGH restricted ROM   Muscle Tone/Strength:Functionally intact. No obvious neuro-muscular anomalies detected.  Muscle Tone/Strength:Functionally intact. No obvious neuro-muscular anomalies detected.  Sensory (Neurological):Musculoskeletal, acute from fall  Sensory (Neurological):Arthropathic arthralgia   DTR: Patellar:deferred today Achilles:deferred today Plantar:deferred today  DTR: Patellar:deferred today Achilles:deferred today Plantar:deferred today  Palpation:No palpable anomalies  Palpation:No palpable anomalies    Assessment   Status Diagnosis  Controlled Controlled Controlled 1. Primary osteoarthritis of left ankle   2. Lumbosacral radiculopathy   3. Chronic, continuous use of opioids   4. Chronic pain syndrome   5. Sciatica of left side associated with disorder of lumbar spine   6. 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 Tobacco abuse   8. Facet arthritis of lumbar region   9. Chronic left-sided low back pain with left-sided sciatica       Plan of Care   [name] Hood a current medication list which includes the following long-term medication(s): bupropion, donepezil, fenofibrate, gabapentin, lisinopril-hydrochlorothiazide, memantine, metoprolol succinate, mirabegron er, omeprazole, primidone, quetiapine, rosuvastatin, trazodone, and bupropion.  Pharmacotherapy (Medications Ordered): Meds ordered this encounter  Medications  . HYDROcodone-acetaminophen (NORCO) 10-325 MG tablet    Sig: Take 1 tablet by mouth every 4 (four) hours as needed for severe pain. Must last 30 days.    Dispense:  180 tablet    Refill:  0    Chronic Pain. (STOP Act - Not applicable). Fill one day early if closed on scheduled refill date.  Marland Kitchen HYDROcodone-acetaminophen (NORCO) 10-325 MG tablet    Sig: Take 1 tablet by mouth every 4 (four) hours as needed for severe pain. Must last 30 days.    Dispense:  180 tablet    Refill:  0    Chronic Pain. (STOP Act - Not applicable). Fill one day early if closed on scheduled refill date.  . Alpha-Lipoic Acid 600 MG CAPS    Sig: Take 1 capsule (600 mg total) by mouth daily.    Dispense:  30 capsule    Refill:  2    Do not place medication on "Automatic Refill". Fill one day early if  pharmacy is closed on scheduled refill date.   Follow-up plan:   Return in about 3 months (around 10/27/2020) for Medication Management, in person.    Recent Visits Date Type Provider Dept  05/26/20 Office Visit [name], MD [facility]  Showing recent visits within past 90 days and meeting all other requirements Today's Visits Date Type Provider Dept  07/27/20 Office Visit [name], MD [facility]  Showing today's visits and meeting all other requirements Future Appointments Date Type Provider Dept  10/25/20 Appointment [name], MD [facility]  Showing future appointments within next 90 days and meeting all other requirements  I discussed the assessment and treatment plan with the  patient. The patient was provided an opportunity to ask questions and all were answered. The patient agreed with the plan and demonstrated an understanding of the instructions.  Patient advised to call back or seek an in-person evaluation if the symptoms or condition worsens.  Duration of encounter: 30 minutes.  Note by: [name], MD Date: 07/27/2020; Time: 9:53 AM

## 2020-07-28 ENCOUNTER — Encounter: Payer: PPO | Admitting: Student in an Organized Health Care Education/Training Program

## 2020-07-28 ENCOUNTER — Other Ambulatory Visit: Payer: Self-pay | Admitting: Family Medicine

## 2020-07-30 ENCOUNTER — Encounter: Payer: Self-pay | Admitting: *Deleted

## 2020-08-01 ENCOUNTER — Telehealth: Payer: Self-pay | Admitting: *Deleted

## 2020-08-01 NOTE — Telephone Encounter (Signed)
Patient scheduled for Lung Screening CT 08/09/2020 11:30 AM , no Change in insurance smoking 10 cigarettes per day.

## 2020-08-02 ENCOUNTER — Other Ambulatory Visit: Payer: Self-pay | Admitting: *Deleted

## 2020-08-02 DIAGNOSIS — F17200 Nicotine dependence, unspecified, uncomplicated: Secondary | ICD-10-CM

## 2020-08-02 DIAGNOSIS — Z87891 Personal history of nicotine dependence: Secondary | ICD-10-CM

## 2020-08-02 DIAGNOSIS — Z122 Encounter for screening for malignant neoplasm of respiratory organs: Secondary | ICD-10-CM

## 2020-08-02 NOTE — Progress Notes (Signed)
Contacted and scheduled for annual lung screening scan. Patient is a current smoker with a 30.5 pack year history 

## 2020-08-08 ENCOUNTER — Ambulatory Visit: Payer: PPO | Admitting: Family Medicine

## 2020-08-08 ENCOUNTER — Telehealth (INDEPENDENT_AMBULATORY_CARE_PROVIDER_SITE_OTHER): Payer: PPO | Admitting: Family Medicine

## 2020-08-08 ENCOUNTER — Encounter: Payer: Self-pay | Admitting: Family Medicine

## 2020-08-08 ENCOUNTER — Other Ambulatory Visit: Payer: Self-pay

## 2020-08-08 DIAGNOSIS — K22719 Barrett's esophagus with dysplasia, unspecified: Secondary | ICD-10-CM | POA: Diagnosis not present

## 2020-08-08 DIAGNOSIS — N3281 Overactive bladder: Secondary | ICD-10-CM

## 2020-08-08 DIAGNOSIS — G8929 Other chronic pain: Secondary | ICD-10-CM

## 2020-08-08 DIAGNOSIS — N1831 Chronic kidney disease, stage 3a: Secondary | ICD-10-CM | POA: Insufficient documentation

## 2020-08-08 DIAGNOSIS — N179 Acute kidney failure, unspecified: Secondary | ICD-10-CM

## 2020-08-08 DIAGNOSIS — E1142 Type 2 diabetes mellitus with diabetic polyneuropathy: Secondary | ICD-10-CM | POA: Diagnosis not present

## 2020-08-08 DIAGNOSIS — R252 Cramp and spasm: Secondary | ICD-10-CM | POA: Insufficient documentation

## 2020-08-08 DIAGNOSIS — M545 Low back pain, unspecified: Secondary | ICD-10-CM | POA: Diagnosis not present

## 2020-08-08 NOTE — Assessment & Plan Note (Signed)
Check A1c.  He will continue Jardiance 25 mg once daily and Ozempic 0.5 mg once weekly.  Discussed the potential for adding an additional medication based on his A1c results.

## 2020-08-08 NOTE — Progress Notes (Signed)
Virtual Visit via telephone Note  This visit type was conducted due to national recommendations for restrictions regarding the COVID-19 pandemic (e.g. social distancing).  This format is felt to be most appropriate for this patient at this time.  All issues noted in this document were discussed and addressed.  No physical exam was performed (except for noted visual exam findings with Video Visits).   I connected with James Hood today at  1:15 PM EDT by telephone and verified that I am speaking with the correct person using two identifiers. Location patient: car Location provider: work  Persons participating in the virtual visit: patient, provider  I discussed the limitations, risks, security and privacy concerns of performing an evaluation and management service by telephone and the availability of in person appointments. I also discussed with the patient that there may be a patient responsible charge related to this service. The patient expressed understanding and agreed to proceed.  Interactive audio and video telecommunications were attempted between this provider and patient, however failed, due to patient having technical difficulties OR patient did not have access to video capability.  We continued and completed visit with audio only.   Reason for visit: f/u.  HPI: DIABETES Disease Monitoring: Blood Sugar ranges-140-150s, excursions to 200-220 Polyuria/phagia/dipsia- no      Optho- UTD Medications: Compliance- taking jardiance, ozempic, has previously been on higher doses of ozempic though developed abdominal pain Hypoglycemic symptoms- no  Chronic low back pain: This is an ongoing issue.  He follows with pain management for this.  The hydrocodone is not providing as much benefit.  He notes pain management advised him that they would not be increasing his hydrocodone dose.  They did add alpha lipoic acid which the patient notes has helped with his ankles.  Patient notes he gets  pain if he stands for any length of time.  He has not done physical therapy though had been doing exercises in the gym which did not help much.  In the past physical therapy has been prohibitively expensive.  He is no longer on NSAIDs given kidney dysfunction recently.  No numbness or weakness.  Cramps: Patient notes cramping in his calves at night.  He does drink 4-5 large glasses of water per day and does not feel that he is dehydrated.  Overactive bladder: Patient is on Myrbetriq.  This has helped decrease his nocturia and his urinary frequency though he does continue to have urinary urgency.  Barrett's esophagus: He follows with Dr. Adria Devon at [facility] for this.  It appears he may be due for a EGD.  He typically follows up with them this time of the year per his report.  He notes they typically will call him to schedule an appointment.  He notes rare reflux if he has a dietary indiscretion.  No blood in his stool.  No dysphagia.  AKI: Related to NSAID use.  Has improved off of NSAIDs though not quite to his baseline.      ROS: See pertinent positives and negatives per HPI.  Past Medical History:  Diagnosis Date  . Anxiety   . Anxiety and depression   . Arthritis   . BPH (benign prostatic hyperplasia)   . 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 Colon polyps   . Dementia ([place])   . Depression   . Diabetes mellitus type 2 in nonobese (HCC)   . GERD (gastroesophageal reflux disease)    BARRETTS ESOPHAGUS RESOLVED PER PATIENT  .  Headache   . 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 History of hiatal hernia   . Hypercholesteremia   . Hypertension   . Hyperthyroidism   . Neuropathic pain   . Primary localized osteoarthritis of right knee   . 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 Stomach ulcer   . Tremor, essential   . Wound infection complicating hardware ([place]) 04/02/2018    Past Surgical History:  Procedure Laterality Date  . ANKLE ARTHROSCOPY  Left 09/29/2019   Procedure: LEFT ANKLE ARTHROSCOPY, DEBRIDEMENT;  Surgeon: Newt Minion, MD;  Location: [facility];  Service: Orthopedics;  Laterality: Left;  . BACK SURGERY    . COLONOSCOPY WITH PROPOFOL N/A 09/14/2016   Procedure: COLONOSCOPY WITH PROPOFOL;  Surgeon: Manya Silvas, MD;  Location: [facility] ENDOSCOPY;  Service: Endoscopy;  Laterality: N/A;  . esophageal stretch    . JOINT REPLACEMENT Right 2016   knee  . LUMBAR LAMINECTOMY/DECOMPRESSION MICRODISCECTOMY Left 02/03/2016   Procedure: LEFT L5-S1 DISKECTOMY;  Surgeon: Leeroy Cha, MD;  Location: [place] NEURO ORS;  Service: Neurosurgery;  Laterality: Left;  LEFT L5-S1 DISKECTOMY  . PULSE GENERATOR IMPLANT N/A 08/21/2017   Procedure: UNILATERAL PULSE GENERATOR IMPLANT;  Surgeon: Meade Maw, MD;  Location: [facility] ORS;  Service: Neurosurgery;  Laterality: N/A;  . PULSE GENERATOR IMPLANT Right 04/02/2018   Procedure: REMOVAL OF PULSE GENERATOR IMPLANT AND LEADS;  Surgeon: Meade Maw, MD;  Location: [facility] ORS;  Service: Neurosurgery;  Laterality: Right;  . TONSILLECTOMY    . TOTAL KNEE ARTHROPLASTY Right 11/15/2014   Procedure: TOTAL KNEE ARTHROPLASTY;  Surgeon: Elsie Saas, MD;  Location: [facility];  Service: Orthopedics;  Laterality: Right;    Family History  Problem Relation Age of Onset  . Heart attack Mother   . Heart disease Mother   . Hypertension Father   . Depression Father   . Kidney cancer Neg Hx   . Prostate cancer Neg Hx     SOCIAL HX: smoker   Current Outpatient Medications:  .  Alpha-Lipoic Acid 600 MG CAPS, Take 1 capsule (600 mg total) by mouth daily., Disp: 30 capsule, Rfl: 2 .  aspirin EC 81 MG tablet, Take 81 mg by mouth daily., Disp: , Rfl:  .  budesonide (ENTOCORT EC) 3 MG 24 hr capsule, Take 9 mg by mouth daily., Disp: , Rfl:  .  buPROPion (WELLBUTRIN SR) 150 MG 12 hr tablet, TAKE ONE TABLET BY MOUTH TWICE A DAY, Disp: 180 tablet, Rfl: 1 .  buPROPion (WELLBUTRIN XL) 300 MG 24 hr  tablet, Take 1 tablet (300 mg total) by mouth daily., Disp: 30 tablet, Rfl: 1 .  Continuous Blood Gluc Receiver (FREESTYLE LIBRE 14 DAY READER) DEVI, USE DAILY AS DIRECED TO CHECK BLOOD GLUCOSE, Disp: 1 each, Rfl: 0 .  Continuous Blood Gluc Sensor (FREESTYLE LIBRE 14 DAY SENSOR) MISC, APPLY ONE SENSOR EVERY 14 DAYS TO MONITOR BLOOD GLUCOSE LEVELS. REMOVE OLD SENSOR BEFORE APPLYING NEW SENSOR, Disp: 1 each, Rfl: 5 .  diphenoxylate-atropine (LOMOTIL) 2.5-0.025 MG tablet, Take 1 tablet by mouth 4 (four) times daily as needed., Disp: , Rfl:  .  divalproex (DEPAKOTE ER) 500 MG 24 hr tablet, Take 1 tablet by mouth 2 (two) times daily., Disp: , Rfl:  .  donepezil (ARICEPT) 10 MG tablet, Take 10 mg by mouth at bedtime., Disp: , Rfl:  .  fenofibrate (TRICOR) 145 MG tablet, TAKE ONE TABLET BY MOUTH DAILY, Disp: 90 tablet, Rfl: 1 .  finasteride (PROSCAR) 5 MG  tablet, Take 5 mg by mouth daily., Disp: , Rfl:  .  gabapentin (NEURONTIN) 600 MG tablet, TAKE TWO TABLETS BY MOUTH THREE TIMES A DAY, Disp: 180 tablet, Rfl: 1 .  [START ON 08/26/2020] HYDROcodone-acetaminophen (NORCO) 10-325 MG tablet, Take 1 tablet by mouth every 4 (four) hours as needed for severe pain. Must last 30 days., Disp: 180 tablet, Rfl: 0 .  [START ON 09/25/2020] HYDROcodone-acetaminophen (NORCO) 10-325 MG tablet, Take 1 tablet by mouth every 4 (four) hours as needed for severe pain. Must last 30 days., Disp: 180 tablet, Rfl: 0 .  icosapent Ethyl (VASCEPA) 1 g capsule, Take 2 g by mouth 2 (two) times daily., Disp: , Rfl:  .  JARDIANCE 25 MG TABS tablet, TAKE ONE TABLET BY MOUTH DAILY, Disp: 30 tablet, Rfl: 5 .  lisinopril-hydrochlorothiazide (ZESTORETIC) 20-25 MG tablet, TAKE ONE TABLET BY MOUTH TWICE A DAY, Disp: 180 tablet, Rfl: 1 .  memantine (NAMENDA) 10 MG tablet, TAKE ONE TABLET BY MOUTH TWICE A DAY, Disp: , Rfl:  .  metoprolol succinate (TOPROL-XL) 25 MG 24 hr tablet, Take 25 mg by mouth daily., Disp: , Rfl:  .  mirabegron ER (MYRBETRIQ) 50  MG TB24 tablet, Take 1 tablet (50 mg total) by mouth daily., Disp: 30 tablet, Rfl: 0 .  Multiple Vitamins-Minerals (CENTRUM SILVER PO), Take 1 tablet by mouth daily., Disp: , Rfl:  .  nicotine (NICODERM CQ - DOSED IN MG/24 HOURS) 14 mg/24hr patch, Place 1 patch (14 mg total) onto the skin daily., Disp: 42 patch, Rfl: 0 .  nicotine (NICODERM CQ - DOSED IN MG/24 HR) 7 mg/24hr patch, Place 1 patch (7 mg total) onto the skin daily. Start 7 mg patch after completing the 14 mg patch., Disp: 14 patch, Rfl: 0 .  omeprazole (PRILOSEC) 20 MG capsule, Take 20 mg by mouth 2 (two) times daily before a meal. , Disp: , Rfl:  .  primidone (MYSOLINE) 50 MG tablet, Take 150-250 mg by mouth 2 (two) times daily. Take 250 mg by mouth in the morning & take 150 mg at night., Disp: , Rfl:  .  QUEtiapine (SEROQUEL) 100 MG tablet, Take 150 mg by mouth at bedtime., Disp: , Rfl:  .  rosuvastatin (CRESTOR) 40 MG tablet, TAKE ONE TABLET BY MOUTH DAILY, Disp: 90 tablet, Rfl: 0 .  Semaglutide,0.25 or 0.5MG /DOS, (OZEMPIC, 0.25 OR 0.5 MG/DOSE,) 2 MG/1.5ML SOPN, Inject 0.5 mg into the skin once a week., Disp: 1.5 mL, Rfl: 3 .  tamsulosin (FLOMAX) 0.4 MG CAPS capsule, TAKE ONE CAPSULE BY MOUTH DAILY AFTER DINNER, Disp: 90 capsule, Rfl: 0 .  traZODone (DESYREL) 50 MG tablet, 25-50 mg at night as needed for sleep, Disp: 30 tablet, Rfl: 1  EXAM: This was a telephone visit and thus no physical exam was completed.  ASSESSMENT AND PLAN:  Discussed the following assessment and plan:  Problem List Items Addressed This Visit    AKI (acute kidney injury) ([place])    Likely related to NSAID use.  Has improved.  We will recheck.      Barrett's esophagus    I encouraged him to contact [facility] if he has not heard from them in the next several weeks for an appointment.      Chronic lumbar pain    Ongoing issues.  Medication management per pain physician.  We will order physical therapy.  Discussed he could go once or twice and then try to do  their interventions at home.  Relevant Orders   Ambulatory referral to Physical Therapy   Leg cramps    Encouraged stretching.  He will continue adequate fluid intake.  We will check electrolytes.  Advised he could try a teaspoon of yellow mustard nightly.      Relevant Orders   Magnesium   Basic Metabolic Panel (BMET)   OAB (overactive bladder)    Seems to be improved with Myrbetriq.  He will monitor on this medication managed by urology.      Type 2 diabetes mellitus (HCC)    Check A1c.  He will continue Jardiance 25 mg once daily and Ozempic 0.5 mg once weekly.  Discussed the potential for adding an additional medication based on his A1c results.      Relevant Orders   HgB A1c       I discussed the assessment and treatment plan with the patient. The patient was provided an opportunity to ask questions and all were answered. The patient agreed with the plan and demonstrated an understanding of the instructions.   The patient was advised to call back or seek an in-person evaluation if the symptoms worsen or if the condition fails to improve as anticipated.  I provided 16 minutes of non-face-to-face time during this encounter.   Tommi Rumps, MD

## 2020-08-08 NOTE — Assessment & Plan Note (Signed)
Seems to be improved with Myrbetriq.  He will monitor on this medication managed by urology.

## 2020-08-08 NOTE — Assessment & Plan Note (Signed)
Encouraged stretching.  He will continue adequate fluid intake.  We will check electrolytes.  Advised he could try a teaspoon of yellow mustard nightly.

## 2020-08-08 NOTE — Assessment & Plan Note (Signed)
Likely related to NSAID use.  Has improved.  We will recheck.

## 2020-08-08 NOTE — Assessment & Plan Note (Signed)
Ongoing issues.  Medication management per pain physician.  We will order physical therapy.  Discussed he could go once or twice and then try to do their interventions at home.

## 2020-08-08 NOTE — Assessment & Plan Note (Signed)
I encouraged him to contact [name] if he has not heard from them in the next several weeks for an appointment.

## 2020-08-09 ENCOUNTER — Ambulatory Visit: Admission: RE | Admit: 2020-08-09 | Payer: PPO | Source: Ambulatory Visit

## 2020-08-09 ENCOUNTER — Other Ambulatory Visit: Payer: Self-pay | Admitting: Family Medicine

## 2020-08-16 ENCOUNTER — Other Ambulatory Visit: Payer: Self-pay

## 2020-08-16 ENCOUNTER — Ambulatory Visit (INDEPENDENT_AMBULATORY_CARE_PROVIDER_SITE_OTHER): Payer: PPO | Admitting: Licensed Clinical Social Worker

## 2020-08-16 DIAGNOSIS — F330 Major depressive disorder, recurrent, mild: Secondary | ICD-10-CM
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
## 2020-08-22 ENCOUNTER — Encounter: Payer: Self-pay | Admitting: Psychiatry

## 2020-08-22 ENCOUNTER — Other Ambulatory Visit: Payer: Self-pay

## 2020-08-22 ENCOUNTER — Ambulatory Visit (INDEPENDENT_AMBULATORY_CARE_PROVIDER_SITE_OTHER): Payer: PPO | Admitting: Psychiatry

## 2020-08-22 VITALS — BP 107/70 | HR 81 | Temp 97.9°F | Wt 169.8 lb

## 2020-08-22 DIAGNOSIS — F3341 Major depressive disorder, recurrent, in partial remission: Secondary | ICD-10-CM | POA: Diagnosis not present

## 2020-08-22 DIAGNOSIS — G4700 Insomnia, unspecified: Secondary | ICD-10-CM

## 2020-08-22 MED ORDER — TRAZODONE HCL 100 MG PO TABS: ORAL_TABLET | ORAL | 0 refills | Status: DC

## 2020-08-22 MED ORDER — BUPROPION HCL ER (XL) 300 MG PO TB24: 300.0000 mg | ORAL_TABLET | Freq: Every day | ORAL | 0 refills | Status: DC

## 2020-08-22 NOTE — Patient Instructions (Signed)
1. Continuebupropion XR 300 mg daily 2. Increase Trazodone 100-150 mg at night as needed for insomnia 2. Next appointment:3 months

## 2020-08-24 ENCOUNTER — Other Ambulatory Visit: Payer: Self-pay

## 2020-08-24 ENCOUNTER — Ambulatory Visit: Payer: PPO | Admitting: Urology

## 2020-08-24 ENCOUNTER — Encounter: Payer: Self-pay | Admitting: Urology

## 2020-08-24 VITALS — BP 117/73 | HR 76 | Ht 70.0 in | Wt 165.0 lb

## 2020-08-24 DIAGNOSIS — N400 Enlarged prostate without lower urinary tract symptoms: Secondary | ICD-10-CM

## 2020-08-24 DIAGNOSIS — N3281 Overactive bladder: Secondary | ICD-10-CM

## 2020-08-24 LAB — BLADDER SCAN AMB NON-IMAGING: Scan Result: 0

## 2020-08-24 MED ORDER — MIRABEGRON ER 50 MG PO TB24
50.0000 mg | ORAL_TABLET | Freq: Every day | ORAL | 11 refills | Status: DC
Start: 2020-08-24 — End: 2020-08-26

## 2020-08-24 NOTE — Patient Instructions (Signed)
Cut back on the amount of fluids that you are drinking.  If your urine is light yellow to clear you are drinking plenty of fluids.  Would also recommend changing to regular water from carbonated water, and decreasing amount of artificial sweetener added to your fluids.  If the Myrbetriq is very expensive, call and let us know and we can try a different medication called Vibegron.   Overactive Bladder, Adult  Overactive bladder is a condition in which a person has a sudden and frequent need to urinate. A person might also leak urine if he or she cannot get to the bathroom fast enough (urinary incontinence). Sometimes, symptoms can interfere with work or social activities. What are the causes? Overactive bladder is associated with poor nerve signals between your bladder and your brain. Your bladder may get the signal to empty before it is full. You may also have very sensitive muscles that make your bladder squeeze too soon. This condition may also be caused by other factors, such as:  Medical conditions: ? Urinary tract infection. ? Infection of nearby tissues. ? Prostate enlargement. ? Bladder stones, inflammation, or tumors. ? Diabetes. ? Muscle or nerve weakness, especially from these conditions:  A spinal cord injury.  Stroke.  Multiple sclerosis.  Parkinson's disease.  Other causes: ? Surgery on the uterus or urethra. ? Drinking too much caffeine or alcohol. ? Certain medicines, especially those that eliminate extra fluid in the body (diuretics). ? Constipation. What increases the risk? You may be at greater risk for overactive bladder if you:  Are an older adult.  Smoke.  Are going through menopause.  Have prostate problems.  Have a neurological disease, such as stroke, dementia, Parkinson's disease, or multiple sclerosis (MS).  Eat or drink alcohol, spicy food, caffeine, and other things that irritate the bladder.  Are overweight or obese. What are the signs or  symptoms? Symptoms of this condition include a sudden, strong urge to urinate. Other symptoms include:  Leaking urine.  Urinating 8 or more times a day.  Waking up to urinate 2 or more times overnight. How is this diagnosed? This condition may be diagnosed based on:  Your symptoms and medical history.  A physical exam.  Blood or urine tests to check for possible causes, such as infection. You may also need to see a health care provider who specializes in urinary tract problems. This is called a urologist. How is this treated? Treatment for overactive bladder depends on the cause of your condition and whether it is mild or severe. Treatment may include:  Bladder training, such as: ? Learning to control the urge to urinate by following a schedule to urinate at regular intervals. ? Doing Kegel exercises to strengthen the pelvic floor muscles that support your bladder.  Special devices, such as: ? Biofeedback. This uses sensors to help you become aware of your body's signals. ? Electrical stimulation. This uses electrodes placed inside the body (implanted) or outside the body. These electrodes send gentle pulses of electricity to strengthen the nerves or muscles that control the bladder. ? Women may use a plastic device, called a pessary, that fits into the vagina and supports the bladder.  Medicines, such as: ? Antibiotics to treat bladder infection. ? Antispasmodics to stop the bladder from releasing urine at the wrong time. ? Tricyclic antidepressants to relax bladder muscles. ? Injections of botulinum toxin type A directly into the bladder tissue to relax bladder muscles.  Surgery, such as: ? A device may be  implanted to help manage the nerve signals that control urination. ? An electrode may be implanted to stimulate electrical signals in the bladder. ? A procedure may be done to change the shape of the bladder. This is done only in very severe cases. Follow these instructions  at home: Eating and drinking  Make diet or lifestyle changes recommended by your health care provider. These may include: ? Drinking fluids throughout the day and not only with meals. ? Cutting down on caffeine or alcohol. ? Eating a healthy and balanced diet to prevent constipation. This may include:  Choosing foods that are high in fiber, such as beans, whole grains, and fresh fruits and vegetables.  Limiting foods that are high in fat and processed sugars, such as fried and sweet foods.   Lifestyle  Lose weight if needed.  Do not use any products that contain nicotine or tobacco. These include cigarettes, chewing tobacco, and vaping devices, such as e-cigarettes. If you need help quitting, ask your health care provider.   General instructions  Take over-the-counter and prescription medicines only as told by your health care provider.  If you were prescribed an antibiotic medicine, take it as told by your health care provider. Do not stop taking the antibiotic even if you start to feel better.  Use any implants or pessary as told by your health care provider.  If needed, wear pads to absorb urine leakage.  Keep a log to track how much and when you drink, and when you need to urinate. This will help your health care provider monitor your condition.  Keep all follow-up visits. This is important. Contact a health care provider if:  You have a fever or chills.  Your symptoms do not get better with treatment.  Your pain and discomfort get worse.  You have more frequent urges to urinate. Get help right away if:  You are not able to control your bladder. Summary  Overactive bladder refers to a condition in which a person has a sudden and frequent need to urinate.  Several conditions may lead to an overactive bladder.  Treatment for overactive bladder depends on the cause and severity of your condition.  Making lifestyle changes, doing Kegel exercises, keeping a log, and  taking medicines can help with this condition. This information is not intended to replace advice given to you by your health care provider. Make sure you discuss any questions you have with your health care provider. Document Revised: 01/25/2020 Document Reviewed: 01/25/2020 Elsevier Patient Education  2021 [employer].

## 2020-08-24 NOTE — [place] Notes (Signed)
   08/24/2020 10:01 AM   James Hood 1941-01-20 [id number]  Reason for visit: Follow up overactive bladder  HPI: I saw James Hood for follow-up of overactive bladder after starting Myrbetriq about a month ago.  He has noticed moderate improvement on this medication and his frequency and urgency, as well as his nocturia.  His primary complaint today is persistent urgency and frequency during the day, nocturia 1-2 times overnight.  He continues to drink 5 large tumblers full of carbonated water with artificially sweetened ginger ale packets.  I had a frank conversation with him that he is probably consuming too much water, and that carbonated water and artificial sweeteners can certainly irritate overactive bladder symptoms.  We discussed behavioral strategies extensively.  He had a recent normal cystoscopy with a small nonobstructive prostate, and PVR today is normal at 0 mL.  Continue Myrbetriq, refills provided-> if cost prohibitive will try Vibegron with PA through [place] discussed at length RTC 3 months symptom check  Billey Co, MD  [facility] [address], [place], [place] [address] [phone]

## 2020-08-25 ENCOUNTER — Encounter: Payer: Self-pay | Admitting: Physical Therapy

## 2020-08-25 ENCOUNTER — Ambulatory Visit: Payer: PPO | Attending: Family Medicine | Admitting: Physical Therapy

## 2020-08-25 DIAGNOSIS — R2689 Other abnormalities of gait and mobility: Secondary | ICD-10-CM | POA: Diagnosis not present

## 2020-08-25 DIAGNOSIS — G8929 Other chronic pain: Secondary | ICD-10-CM | POA: Diagnosis not present

## 2020-08-25 DIAGNOSIS — M545 Low back pain, unspecified: Secondary | ICD-10-CM

## 2020-08-25 NOTE — Addendum Note (Signed)
Addended by: Kelton Pillar on: 08/25/2020 03:41 PM   Modules accepted: Orders

## 2020-08-25 NOTE — Therapy (Signed)
[facility] [address] [place], [address] Phone: [phone]   Fax:  [phone]  Physical Therapy Evaluation  Patient Details  Name: James Hood MRN: [id number] Date of Birth: August 25, 1940 Referring Provider (PT): Caryl Bis MD   Encounter Date: 08/25/2020   PT End of Session - 08/25/20 1110    Visit Number 1    Number of Visits 17    Date for PT Re-Evaluation 09/29/20    PT Start Time 0947    PT Stop Time 1031    PT Time Calculation (min) 44 min    Equipment Utilized During Treatment Gait belt    Activity Tolerance Patient tolerated treatment well    Behavior During Therapy [facility] for tasks assessed/performed           Past Medical History:  Diagnosis Date  . Anxiety   . Anxiety and depression   . Arthritis   . BPH (benign prostatic hyperplasia)   . 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 Colon polyps   . Dementia ([place])   . Depression   . Diabetes mellitus type 2 in nonobese (HCC)   . GERD (gastroesophageal reflux disease)    BARRETTS ESOPHAGUS RESOLVED PER PATIENT  . Headache   . 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[place])   . History of hiatal hernia   . Hypercholesteremia   . Hypertension   . Hyperthyroidism   . Neuropathic pain   . Primary localized osteoarthritis of right knee   . 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 Stomach ulcer   . Tremor, essential   . Wound infection complicating hardware ([place]) 04/02/2018    Past Surgical History:  Procedure Laterality Date  . ANKLE ARTHROSCOPY Left 09/29/2019   Procedure: LEFT ANKLE ARTHROSCOPY, DEBRIDEMENT;  Surgeon: Newt Minion, MD;  Location: [facility];  Service: Orthopedics;  Laterality: Left;  . BACK SURGERY    . COLONOSCOPY WITH PROPOFOL N/A 09/14/2016   Procedure: COLONOSCOPY WITH PROPOFOL;  Surgeon: Manya Silvas, MD;  Location: [facility] ENDOSCOPY;  Service: Endoscopy;  Laterality:  N/A;  . esophageal stretch    . JOINT REPLACEMENT Right 2016   knee  . LUMBAR LAMINECTOMY/DECOMPRESSION MICRODISCECTOMY Left 02/03/2016   Procedure: LEFT L5-S1 DISKECTOMY;  Surgeon: Leeroy Cha, MD;  Location: [facility] NEURO ORS;  Service: Neurosurgery;  Laterality: Left;  LEFT L5-S1 DISKECTOMY  . PULSE GENERATOR IMPLANT N/A 08/21/2017   Procedure: UNILATERAL PULSE GENERATOR IMPLANT;  Surgeon: Meade Maw, MD;  Location: [facility] ORS;  Service: Neurosurgery;  Laterality: N/A;  . PULSE GENERATOR IMPLANT Right 04/02/2018   Procedure: REMOVAL OF PULSE GENERATOR IMPLANT AND LEADS;  Surgeon: Meade Maw, MD;  Location: [facility] ORS;  Service: Neurosurgery;  Laterality: Right;  . TONSILLECTOMY    . TOTAL KNEE ARTHROPLASTY Right 11/15/2014   Procedure: TOTAL KNEE ARTHROPLASTY;  Surgeon: Elsie Saas, MD;  Location: [facility];  Service: Orthopedics;  Laterality: Right;    There were no vitals filed for this visit.    Subjective Assessment - 08/25/20 0959    Pertinent History Patient is a 80 y.o retired male that presents to PT with chronic LBP. Patient reports his pain stays local to his low back and does not travel anywhere. He reports it has stayed the same over the past few years but his activity tolerance has decreased. He describees the pain as a constant ache anytime he is on his feet. He denies numbness and tingling. His  current pain is 3/10. At worst, his pain is 6/10 with activities such as walking, standing, carrying or lifting. At best, his pain is 2/10 with activities like sitting, sleeping on side or back, or heat from dog laying behind him in bed. He reports he does not have many hobbies due to pain because he is unable to be on his feet for long. Patient denies history of cancer, stroke, heart attack, seizures, but reports weight loss due to decreased appetite and a fall in the past 6 months.    Limitations Lifting;Standing;House hold activities;Walking    How long can you sit comfortably?  unlimited    How long can you stand comfortably? 2 minutes    How long can you walk comfortably? 0    Diagnostic tests none    Patient Stated Goals releive back pain, increase standing endurance    Currently in Pain? Yes    Pain Score 3     Pain Location Back    Pain Orientation Lower    Pain Type Chronic pain    Pain Onset More than a month ago    Pain Frequency Constant    Aggravating Factors  standing, walking, lifting, carrying    Pain Relieving Factors sitting, laying down, heat           . Observation  o Posture - flat back, increased thoracic kyphosis  o Gait - shuffle, decreased step length, use of cane  . AROM and OP o Hip:  - Flexion - limited to 90 deg bilaterally, with hard end feel   o Lumbar  - Flexion - good motion but painful due to hamstring stretch  - Extension - decreased symptoms  - LF - WNL  o Repeated movements: repeated extension decreased pain  . Muscle Testing  o Glute bridge - able to hold for 30 seconds, mild wavering, slightly painful in lower back towards end  o Hip  - Flex 5/5 - ABD 4-/ 5  - Ext 3+/4-  . Functional tests o 5TSTS - 14 seconds  o DGI - 16/24 . Palpation  o Paraspinals - no asymmetry   o L2-L4 painful and hypomobile  . Special tests  o Slump o 90/90 - 42 deg on L, 35 deg on R Gait speed: 14.11 seconds = .71 m/sec   Patient educated on HEP including prone prop, knee to chest, and sit to stand with extension at the top. Patient demonstrated proper technique of exercises.     Objective measurements completed on examination: See above findings.      PT Education - 08/25/20 1109    Education Details Patient educated on importance of chaning positions when performing cleaning activities for extended periods of times. Patient educated on HEP and the importance of moving throughout the day to help decrease pain.    Person(s) Educated Patient    Methods Explanation;Demonstration;Verbal cues    Comprehension Verbalized  understanding;Returned demonstration            PT Short Term Goals - 08/25/20 1057      PT SHORT TERM GOAL #1   Title Patient will demonstrate independence with HEP in order to increase strength and mobility and reduce pain.    Baseline 08/25/20- HEP given    Time 4    Period Weeks    Status New    Target Date 09/22/20             PT Long Term Goals - 08/25/20 1059  PT LONG TERM GOAL #1   Title Patient will improve DGI to >20 to indicate significant improvement in fall risk.    Baseline 08/25/20 - 16/24    Time 8    Period Weeks    Status New    Target Date 10/20/20      PT LONG TERM GOAL #2   Title Patient will report decrease pain to 3/10 with activity according to the NPRS in order to increase functional activity tolerance.    Baseline 08/25/20- 6/10 with activity    Time 8    Period Weeks    Status New    Target Date 10/20/20      PT LONG TERM GOAL #3   Title Patient will perform 5TSTS in <12 seconds to indicate an increase in hip muscle power.    Baseline 08/25/20- 14 seconds    Time 6    Period Weeks    Status New    Target Date 10/20/20      PT LONG TERM GOAL #4   Title Patient will increase FOTO score to 60 to demonstrate predicted increase in functional mobility to complete ADLs    Baseline 08/25/20 49    Time 8    Period Weeks    Status New                  Plan - 08/25/20 1048    Clinical Impression Statement Patient presents to PT with chronic LBP that limits his ability to comfortably perform functional activities. Impairments include decrease hip ROM, decreased hip ext strength, decreased hip muscle power, hypomobile lumbar spine, pain, abnormal gait, decreased gait speed, and decreased balance. Activity limitations include washing the dishes, cooking, carrying groceries in from the car, and community ambulation, which decrease his participation in functinal ADL's, housekeeping, and community involvement. patient will benefit from skilled PT  intervention to decrease pain and increase activity tolerance to increase functional activity tolerance.    Personal Factors and Comorbidities Comorbidity 3+;Social Background;Time since onset of injury/illness/exacerbation;Finances    Comorbidities Chronic ankle pain, Diabetes, frequent falls    Examination-Activity Limitations Squat;Lift;Stairs;Locomotion Level;Stand;Carry;Bend    Examination-Participation Restrictions Community Activity;Cleaning    Stability/Clinical Decision Making Evolving/Moderate complexity    Clinical Decision Making Moderate    Rehab Potential Fair    PT Frequency 2x / week    PT Duration 8 weeks    PT Treatment/Interventions Electrical Stimulation;Moist Heat;Stair training;Therapeutic activities;Neuromuscular re-education;Manual techniques;Dry needling;Passive range of motion;Patient/family education;Joint Manipulations;Spinal Manipulations;ADLs/Self Care Home Management;Aquatic Therapy;Traction;Iontophoresis 4mg /ml Dexamethasone;Ultrasound;DME Instruction;Contrast Bath;Gait training;Balance training;Therapeutic exercise;Functional mobility training;Energy conservation    PT Next Visit Plan therex, review HEP    PT Home Exercise Plan sit to stand, prone prop up repeated extension, single knee to chest    Consulted and Agree with Plan of Care Patient           Patient will benefit from skilled therapeutic intervention in order to improve the following deficits and impairments:  Pain,Abnormal gait,Impaired sensation,Decreased mobility,Increased muscle spasms,Postural dysfunction,Decreased strength,Decreased endurance,Decreased activity tolerance,Decreased balance,Difficulty walking,Hypomobility,Improper body mechanics,Decreased range of motion,Decreased coordination,Impaired flexibility  Visit Diagnosis: Chronic bilateral low back pain without sciatica     Problem List Patient Active Problem List   Diagnosis Date Noted  . OAB (overactive bladder) 08/08/2020  .  AKI (acute kidney injury) ([place]) 08/08/2020  . Leg cramps 08/08/2020  . Prolonged Q-T interval on ECG 06/08/2020  . CAP (community acquired pneumonia) 05/30/2020  . 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 Right leg pain 02/05/2020  .  Impingement of left ankle joint   . Sleeping difficulty 08/24/2019  . Hiccups 08/24/2019  . Chronic pain of left ankle 02/27/2019  . Vitamin D deficiency 02/18/2019  . Left shoulder pain 02/02/2019  . Neuropathy 09/24/2018  . Lumbosacral radiculopathy 01/14/2018  . 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 Facet arthritis of lumbar region 01/14/2018  . Chronic pain syndrome 08/26/2017  . 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 Thyroid nodule 02/27/2017  . Fatty liver 02/27/2017  . Purpura ([place]) 01/29/2017  . Right hip pain 08/16/2016  . Lumbar herniated disc 02/03/2016  . Hypertriglyceridemia 12/09/2015  . Anemia 12/09/2015  . Falls 09/16/2015  . Barrett's esophagus 09/16/2015  . Chronic lumbar pain 09/16/2015  . Benign prostatic hyperplasia 09/16/2015  . Trochanteric bursitis of left hip 09/09/2015  . Headache 09/09/2015  . Difficulty in walking 06/01/2015  . Amnesia 06/01/2015  . HLD (hyperlipidemia) 02/23/2015  . DJD (degenerative joint disease) of knee 11/15/2014  . Hypertension   . Tremor, essential   . Primary localized osteoarthritis of right knee   . Anxiety and depression   . Absence of sensation 11/18/2013  . Tobacco abuse 11/18/2013  . Chronic diarrhea 11/12/2013  . Benign neoplasm of colon 08/25/2013  . 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 Type 2 diabetes mellitus ([place]) 10/06/2012    Durwin Reges DPT [address], SPT  Durwin Reges 08/25/2020, 3:38 PM  [facility] [address] [place], [address] Phone: [phone]   Fax:  [phone]  Name: James Hood MRN: [id number] Date of Birth: 1941/04/09

## 2020-08-26 ENCOUNTER — Other Ambulatory Visit: Payer: Self-pay

## 2020-08-26 DIAGNOSIS — N3281 Overactive bladder: Secondary | ICD-10-CM

## 2020-08-26 DIAGNOSIS — N400 Enlarged prostate without lower urinary tract symptoms: Secondary | ICD-10-CM

## 2020-08-26 MED ORDER — MIRABEGRON ER 50 MG PO TB24: 50.0000 mg | ORAL_TABLET | Freq: Every day | ORAL | 11 refills | Status: DC

## 2020-08-30 ENCOUNTER — Ambulatory Visit: Payer: PPO | Admitting: Physical Therapy

## 2020-09-01 ENCOUNTER — Ambulatory Visit: Payer: PPO | Admitting: Physical Therapy

## 2020-09-06 ENCOUNTER — Encounter: Payer: PPO | Admitting: Physical Therapy

## 2020-09-07 ENCOUNTER — Other Ambulatory Visit: Payer: Self-pay

## 2020-09-07 ENCOUNTER — Ambulatory Visit (INDEPENDENT_AMBULATORY_CARE_PROVIDER_SITE_OTHER): Payer: PPO | Admitting: Licensed Clinical Social Worker

## 2020-09-07 DIAGNOSIS — F3341 Major depressive disorder, recurrent, in partial remission: Secondary | ICD-10-CM

## 2020-09-07 DIAGNOSIS — Z5329 Procedure and treatment not carried out because of patient's decision for other reasons: Secondary | ICD-10-CM

## 2020-09-07 NOTE — Progress Notes (Signed)
LCSW counselor tried to connect with patient for scheduled appointment via MyChart video text request x 2 and email request; also tried to connect via phone without success. LCSW counselor attempted to leave voice mail but could not.   [name], MSW, LCSW Outpatient Therapist/Triage Specialist

## 2020-09-07 NOTE — Progress Notes (Signed)
OPT Note  I connected with James Hood on 09/07/20 at  1:00 PM EDT and verified that I am speaking with the correct person using two identifiers.  Location: Patient: [facility] Provider: [facility]   I discussed the assessment and treatment plan with the patient. The patient was provided an opportunity to ask questions and all were answered. The patient agreed with the plan and demonstrated an understanding of the instructions.   I provided 60 minutes of face-to-face time during this encounter.   James Hood R Chosen Geske, LCSW    THERAPIST PROGRESS NOTE  Session Time: 1-2p  Participation Level: Active  Behavioral Response: Neat and Well GroomedAlertAngry and Anxious  Type of Therapy: Individual Therapy  Treatment Goals addressed: Anger and Anxiety  Interventions: CBT, Reframing and Other: trauma focused  Summary: James Hood is a 80 y.o. male who presents with improving symptoms related to depression diagnosis.  Pt reports that overall mood is stable and that he is managing stress and anxiety well.  Allowed pt to explore thoughts and feelings about recent stressors--pt is having some remodeling work in his home and the contracted staff are not showing up. Pt states that this is triggering a significant amount of stress, anxiety, and anger. Pt reports that he is managing as best as he can.  Pt continued to explore past/present thoughts and feelings and linked situations from past into feelings that pt still has in the present.   Continued recommendations are as follows: self care behaviors, positive social engagements, focusing on overall work/home/life balance, and focusing on positive physical and emotional wellness.    Suicidal/Homicidal: No  Therapist Response: James Hood reporting a temporary escalation in symptoms due to external stressor. Reviewed coping skills. Progress fluctuating/intermittent. Treatment to continue.  Plan: Return again in 4 weeks.  Diagnosis: Axis I: MDD,  recurrent, in partial remission    Axis II: No diagnosis    James Bo Prestyn Mahn, LCSW 09/07/2020

## 2020-09-08 ENCOUNTER — Other Ambulatory Visit (INDEPENDENT_AMBULATORY_CARE_PROVIDER_SITE_OTHER): Payer: PPO

## 2020-09-08 ENCOUNTER — Encounter: Payer: PPO | Admitting: Physical Therapy

## 2020-09-08 DIAGNOSIS — R252 Cramp and spasm: Secondary | ICD-10-CM | POA: Diagnosis not present

## 2020-09-08 DIAGNOSIS — E1142 Type 2 diabetes mellitus with diabetic polyneuropathy: Secondary | ICD-10-CM

## 2020-09-08 LAB — HEMOGLOBIN A1C: Hgb A1c MFr Bld: 7.1 % — ABNORMAL HIGH (ref 4.6–6.5)

## 2020-09-08 LAB — BASIC METABOLIC PANEL
BUN: 38 mg/dL — ABNORMAL HIGH (ref 6–23)
CO2: 30 mEq/L (ref 19–32)
Calcium: 9.7 mg/dL (ref 8.4–10.5)
Chloride: 97 mEq/L (ref 96–112)
Creatinine, Ser: 1.47 mg/dL (ref 0.40–1.50)
GFR: 44.74 mL/min — ABNORMAL LOW (ref >60.00)
Glucose, Bld: 161 mg/dL — ABNORMAL HIGH (ref 70–99)
Potassium: 3.9 mEq/L (ref 3.5–5.1)
Sodium: 136 mEq/L (ref 135–145)

## 2020-09-08 LAB — MAGNESIUM: Magnesium: 1.4 mg/dL — ABNORMAL LOW (ref 1.5–2.5)

## 2020-09-13 ENCOUNTER — Ambulatory Visit (INDEPENDENT_AMBULATORY_CARE_PROVIDER_SITE_OTHER): Payer: PPO | Admitting: Pharmacist

## 2020-09-13 ENCOUNTER — Encounter: Payer: PPO | Admitting: Physical Therapy

## 2020-09-13 DIAGNOSIS — M545 Low back pain, unspecified: Secondary | ICD-10-CM

## 2020-09-13 DIAGNOSIS — E1142 Type 2 diabetes mellitus with diabetic polyneuropathy: Secondary | ICD-10-CM

## 2020-09-13 DIAGNOSIS — E782 Mixed hyperlipidemia: Secondary | ICD-10-CM | POA: Diagnosis not present

## 2020-09-13 DIAGNOSIS — N3281 Overactive bladder: Secondary | ICD-10-CM

## 2020-09-13 DIAGNOSIS — R252 Cramp and spasm: Secondary | ICD-10-CM

## 2020-09-13 DIAGNOSIS — I10 Essential (primary) hypertension: Secondary | ICD-10-CM

## 2020-09-13 DIAGNOSIS — N1831 Chronic kidney disease, stage 3a: Secondary | ICD-10-CM | POA: Diagnosis not present

## 2020-09-13 DIAGNOSIS — F32A Depression, unspecified: Secondary | ICD-10-CM

## 2020-09-13 DIAGNOSIS — F419 Anxiety disorder, unspecified: Secondary | ICD-10-CM

## 2020-09-13 DIAGNOSIS — G8929 Other chronic pain: Secondary | ICD-10-CM

## 2020-09-13 DIAGNOSIS — G250 Essential tremor: Secondary | ICD-10-CM

## 2020-09-13 MED ORDER — ICOSAPENT ETHYL 1 G PO CAPS: 2.0000 g | ORAL_CAPSULE | Freq: Two times a day (BID) | ORAL | 3 refills | Status: DC

## 2020-09-13 NOTE — Chronic Care Management (AMB) (Signed)
Chronic Care Management Pharmacy Note  09/13/2020 Name:  KIENAN DOUBLIN MRN:  [id number] DOB:  15-Jan-1941  Subjective: James Hood is an 80 y.o. year old male who is a primary patient of Caryl Bis, Angela Adam, MD.  The CCM team was consulted for assistance with disease management and care coordination needs.    Engaged with patient by telephone for follow up visit in response to provider referral for pharmacy case management and/or care coordination services.   Consent to Services:  The patient was given information about Chronic Care Management services, agreed to services, and gave verbal consent prior to initiation of services.  Please see initial visit note for detailed documentation.   Patient Care Team: Leone Haven, MD as PCP - General (Family Medicine) De Hollingshead, RPH-CPP as Pharmacist (Pharmacist)  Recent office visits:  3/21 - PCP - f/u with GI, ordered PT; A1c 7.1%, Mag low, hydrate  Recent consult visits:  3/9 - pain management [facility]; continue regimen  3/29 - therapy w/ LCSW  4/4 - [facility]- continue bupropion, trazodone, monitor multiple serotonergic agents  4/6 - [facility] - improvement s/p Myrbetriq- cut back on artificial sweeteners  4/20 - therapy w/ [facility] visits: None in previous 6 months  Objective:  Lab Results  Component Value Date   CREATININE 1.47 09/08/2020   CREATININE 1.21 07/20/2020   CREATININE 1.50 07/05/2020    Lab Results  Component Value Date   HGBA1C 7.1 (H) 09/08/2020   Last diabetic Eye exam:  Lab Results  Component Value Date/Time   HMDIABEYEEXA No Retinopathy 01/27/2020 03:37 PM    Last diabetic Foot exam: No results found for: HMDIABFOOTEX      Component Value Date/Time   CHOL 99 07/05/2020 1209   TRIG 226.0 (H) 07/05/2020 1209   HDL 43.20 07/05/2020 1209   CHOLHDL 2 07/05/2020 1209   VLDL 45.2 (H) 07/05/2020 1209   LDLCALC 57 09/25/2017 0924   LDLDIRECT 31.0 07/05/2020  1209    Hepatic Function Latest Ref Rng & Units 06/08/2020 05/31/2020 05/30/2020  Total Protein 6.5 - 8.1 g/dL 6.9 5.8(L) 7.0  Albumin 3.5 - 5.0 g/dL 3.3(L) 2.8(L) 3.6  AST 15 - 41 U/L _0 ALT 0 - 44 U/L _1 Alk Phosphatase 38 - 126 U/L 58 32(L) 35(L)  Total Bilirubin 0.3 - 1.2 mg/dL 0.8 0.7 1.2  Bilirubin, Direct 0.0 - 0.3 mg/dL - - -    Lab Results  Component Value Date/Time   TSH 0.346 (L) 05/31/2020 12:34 AM   TSH 1.36 07/20/2019 09:02 AM   TSH 1.01 02/02/2019 12:15 PM    CBC Latest Ref Rng & Units 06/08/2020 06/01/2020 05/31/2020  WBC 4.0 - 10.5 K/uL 6.5 6.7 11.1(H)  Hemoglobin 13.0 - 17.0 g/dL 11.8(L) 10.2(L) 10.4(L)  Hematocrit 39.0 - 52.0 % 35.7(L) 31.0(L) 31.2(L)  Platelets 150 - 400 K/uL 375 203 202    Lab Results  Component Value Date/Time   VD25OH 42.83 04/07/2019 08:54 AM   VD25OH 24.06 (L) 03/13/2019 10:07 AM    Clinical ASCVD: No  The ASCVD Risk score Mikey Bussing DC Jr., et al., 2013) failed to calculate for the following reasons:   The 2013 ASCVD risk score is only valid for ages 78 to 55     Social History   Tobacco Use  Smoking Status Current Every Day Smoker  . Packs/day: 0.50  . Years: 60.00  . Pack years: 30.00  . Types: Cigarettes  Smokeless Tobacco Never Used   BP Readings from Last 3 Encounters:  08/24/20 117/73  08/22/20 107/70  07/27/20 97/60   Pulse Readings from Last 3 Encounters:  08/24/20 76  08/22/20 81  07/27/20 66   Wt Readings from Last 3 Encounters:  08/24/20 165 lb (74.8 kg)  08/22/20 169 lb 12.8 oz (77 kg)  08/08/20 165 lb (74.8 kg)    Assessment: Review of patient past medical history, allergies, medications, health status, including review of consultants reports, laboratory and other test data, was performed as part of comprehensive evaluation and provision of chronic care management services.   SDOH:  (Social Determinants of Health) assessments and interventions performed:  SDOH Interventions   Flowsheet Row  Most Recent Value  SDOH Interventions   Financial Strain Interventions Other (Comment)  [manufacturer assistance]      CCM Care Plan  No Known Allergies  Medications Reviewed Today    Reviewed by De Hollingshead, RPH-CPP (Pharmacist) on 09/13/20 at 1518  Med List Status: <None>  Medication Order Taking? Sig Documenting Provider Last Dose Status Informant  Alpha-Lipoic Acid 600 MG CAPS [id number] Yes Take 1 capsule (600 mg total) by mouth daily. [name], MD Taking Active   aspirin EC 81 MG tablet [id number] Yes Take 81 mg by mouth daily. [provider] Taking Active            Med Note Vanessa [place], SUSAN   Tue Apr 15, 2018  8:47 AM)    budesonide (ENTOCORT EC) 3 MG 24 hr capsule [id number] Yes Take 9 mg by mouth daily. [provider] Taking Active   buPROPion (WELLBUTRIN XL) 300 MG 24 hr tablet [id number] Yes Take 1 tablet (300 mg total) by mouth daily. Norman Clay, MD Taking Active   Continuous Blood Gluc Sensor (FREESTYLE LIBRE 14 DAY SENSOR) MISC [id number] Yes APPLY ONE SENSOR EVERY 14 DAYS TO MONITOR BLOOD GLUCOSE LEVELS. REMOVE OLD SENSOR BEFORE APPLYING NEW SENSOR Leone Haven, MD Taking Active   diphenoxylate-atropine (LOMOTIL) 2.5-0.025 MG tablet [id number] Yes Take by mouth. [provider] Taking Active   divalproex (DEPAKOTE ER) 500 MG 24 hr tablet [id number] Yes Take 1 tablet by mouth 2 (two) times daily. [provider] Taking Active   donepezil (ARICEPT) 10 MG tablet [id number] Yes Take 10 mg by mouth at bedtime. [provider] Taking Active Self  fenofibrate (TRICOR) 145 MG tablet [id number] Yes TAKE ONE TABLET BY MOUTH DAILY Leone Haven, MD Taking Active   finasteride (PROSCAR) 5 MG tablet [id number] Yes TAKE ONE TABLET BY MOUTH DAILY. Leone Haven, MD Taking Active   gabapentin (NEURONTIN) 600 MG tablet [id number] Yes TAKE TWO TABLETS BY MOUTH THREE TIMES A DAY Leone Haven, MD Taking Active    HYDROcodone-acetaminophen [facility]) 10-325 MG tablet [id number] Yes Take 1 tablet by mouth every 4 (four) hours as needed for severe pain. Must last 30 days. [name], MD Taking Active   HYDROcodone-acetaminophen [facility]) 10-325 MG tablet [id number]  Take 1 tablet by mouth every 4 (four) hours as needed for severe pain. Must last 30 days. [name], MD  Active   JARDIANCE 25 MG TABS tablet [id number] Yes TAKE ONE TABLET BY MOUTH DAILY Leone Haven, MD Taking Active   lisinopril-hydrochlorothiazide (ZESTORETIC) 20-25 MG tablet [id number] Yes TAKE ONE TABLET BY MOUTH TWICE A DAY Leone Haven, MD Taking Active   memantine (NAMENDA) 10 MG tablet [id number] Yes TAKE ONE TABLET BY MOUTH TWICE A DAY [provider] Taking  Active   metoprolol succinate (TOPROL-XL) 25 MG 24 hr tablet [id number] Yes Take 25 mg by mouth daily. [provider] Taking Active   mirabegron ER (MYRBETRIQ) 50 MG TB24 tablet [id number] Yes Take 1 tablet (50 mg total) by mouth daily. Billey Co, MD Taking Active   Multiple Vitamins-Minerals (CENTRUM SILVER PO) [id number] Yes Take 1 tablet by mouth daily. [provider] Taking Active   omeprazole (PRILOSEC) 20 MG capsule [id number] Yes Take 20 mg by mouth 2 (two) times daily before a meal.  [provider] Taking Active Self           Med Note Josiah Lobo, KIMBERLY   Thu Aug 09, 2016 12:05 PM)    primidone (MYSOLINE) 50 MG tablet [id number] Yes Take 150-250 mg by mouth 2 (two) times daily. Take 250 mg by mouth in the morning & take 150 mg at night. [provider] Taking Active Self  QUEtiapine (SEROQUEL) 100 MG tablet [id number] Yes Take 150 mg by mouth at bedtime. [provider] Taking Active Pharmacy Records  rosuvastatin (CRESTOR) 40 MG tablet [id number] Yes TAKE ONE TABLET BY MOUTH DAILY Leone Haven, MD Taking Active   Semaglutide,0.25 or 0.5MG/DOS, (OZEMPIC, 0.25 OR 0.5 MG/DOSE,) 2 MG/1.5ML SOPN [id number] Yes  Inject 0.5 mg into the skin once a week. Leone Haven, MD Taking Active Pharmacy Records  tamsulosin [facility]) 0.4 MG CAPS capsule [id number] Yes TAKE ONE CAPSULE BY MOUTH DAILY AFTER Shawna Clamp, Randell Patient, MD Taking Active   traZODone (DESYREL) 100 MG tablet [id number] Yes 100-150 mg at night as needed for sleep Norman Clay, MD Taking Active   Med List Note Dewayne Shorter, RN 07/27/20 0915): [id number] UDS 10-06-2019          Patient Active Problem List   Diagnosis Date Noted  . OAB (overactive bladder) 08/08/2020  . AKI (acute kidney injury) ([place]) 08/08/2020  . Leg cramps 08/08/2020  . Prolonged Q-T interval on ECG 06/08/2020  . CAP (community acquired pneumonia) 05/30/2020  . 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 Right leg pain 02/05/2020  . Impingement of left ankle joint   . Sleeping difficulty 08/24/2019  . Hiccups 08/24/2019  . Chronic pain of left ankle 02/27/2019  . Vitamin D deficiency 02/18/2019  . Left shoulder pain 02/02/2019  . Neuropathy 09/24/2018  . Lumbosacral radiculopathy 01/14/2018  . 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 Facet arthritis of lumbar region 01/14/2018  . Chronic pain syndrome 08/26/2017  . 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 Thyroid nodule 02/27/2017  . Fatty liver 02/27/2017  . Purpura ([place]) 01/29/2017  . Right hip pain 08/16/2016  . Lumbar herniated disc 02/03/2016  . Hypertriglyceridemia 12/09/2015  . Anemia 12/09/2015  . Falls 09/16/2015  . Barrett's esophagus 09/16/2015  . Chronic lumbar pain 09/16/2015  . Benign prostatic hyperplasia 09/16/2015  . Trochanteric bursitis of left hip 09/09/2015  . Headache 09/09/2015  . Difficulty in walking 06/01/2015  . Amnesia 06/01/2015  . HLD (hyperlipidemia) 02/23/2015  . DJD (degenerative joint disease) of knee 11/15/2014  . Hypertension   . Tremor, essential   . Primary localized osteoarthritis of right knee   . Anxiety and depression   . Absence of sensation 11/18/2013   . Tobacco abuse 11/18/2013  . Chronic diarrhea 11/12/2013  . Benign neoplasm of colon 08/25/2013  . 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 Type 2 diabetes mellitus ([place]) 10/06/2012    Immunization History  Administered Date(s) Administered  . Fluad Quad(high Dose 65+) 02/05/2020  .  Hepatitis A, Adult 12/02/2018  . Influenza, High Dose Seasonal PF 01/29/2017, 03/19/2018, 01/10/2019  . Influenza,inj,Quad PF,6+ Mos 03/13/2016  . Influenza-Unspecified 01/21/2015  . PFIZER(Purple Top)SARS-COV-2 Vaccination 06/01/2019, 06/22/2019  . Pneumococcal Conjugate-13 02/07/2015  . Pneumococcal Polysaccharide-23 01/18/2018  . Tdap 09/16/2015, 01/18/2018  . Zoster 09/16/2015  . Zoster Recombinat (Shingrix) 01/03/2018, 05/09/2018    Conditions to be addressed/monitored: DMII, Depression and chronic pain  Care Plan : Medication Management  Updates made by De Hollingshead, RPH-CPP since 09/13/2020 12:00 AM    Problem: Diabetes, Depression     Long-Range Goal: Disease Progression Prevention   Start Date: 02/16/2020  This Visit's Progress: On track  Recent Progress: On track  Priority: Medium  Note:   Current Barriers:  . Unable to independently afford treatment regimen  Pharmacist Clinical Goal(s):  Marland Kitchen Over the next 30 days, patient will verbalize ability to afford treatment regimen through collaboration with PharmD and provider.  . Over the next 90 days, patient will maintain glycemic control as evidenced by A1c through collaboration with PharmD and provider.  Interventions: . 1:1 collaboration with Leone Haven, MD regarding development and update of comprehensive plan of care as evidenced by provider attestation and co-signature . Inter-disciplinary care team collaboration (see longitudinal plan of care) . Comprehensive medication review performed; medication list updated in electronic medical record  Health Maintenance: . Reviewed lab results. Patient intakes that he is  drinking at least 60 oz water daily. Will let PCP know this. He also agrees to pick up an over the counter magnesium supplement. Patient has PCP f/u in ~ 4 weeks, can recheck BMP at that time.  SDOH: . Reports that he cannot afford to continue physical therapy. Discussed Care Guide referral. Patient amenable.   Diabetes: . Unontrolled; current treatment: Jardiance 25 mg daily, Ozempic 0.5 mg weekly (max tolerated dose d/t GI upset/weight loss at 1 mg weekly) o Hx metformin ER - significant diarrhea that resolved upon discontinuation . Over income for [place] assistance, though approved for Ozempic assistance for 2022 . Current glucose readings: using Libre 2 CGM, but does not have his reader with him right now for review. Notes that he has been trying to "do better" in the past few weeks.  . Reviewed goals regarding time in range, especially considering additional comorbidities.  . Discussed goal A1c, though more relaxed goal of <7.5% without hypoglycemia may be appropriate given age, comorbidities.   Hx tobacco use: Marland Kitchen Patient reports he quit smoking after hospitalization, but has restarted about 2 months ago . Did not have time to discuss today. Will continue to follow moving forward for patient interest in cessation  Hypertension: . Controlled per last clinic reading; current treatment: lisinopril/HCTZ 20/25 mg daily, metoprolol succinate 25 mg daily . Recommended to continue current regimen  Hyperlipidemia: . Controlled; current treatment: rosuvastatin 40 mg daily, fenofibrate 145 mg daily, Vascepa 2 g BID- Vascepa had been removed from the med list at prior appointment. Patient unsure if he is taking or not.  o Programmer, systems for [employer] . Recommended to continue current regimen.  Reordered Vascepa, encouraged to ensure appropriate administration when he is back home with his medications  Depression/insomnia: . Improving. Current regimen: bupropion XL 300 mg daily,  trazodone 100-150 mg QPM. Follows w/ Dr. Modesta Messing and LCSW . Reports significant benefit of sleep with trazodone. Appears conversation with psychiatry to monitor for need to adjust therapy given concurrent bupropion, trazodone, and quetiapine.  . Continue current collaboration with psychiatry.  Chronic Pain w/ recent ankle surgery: . Improved per patient report; hydrocodone/APAP 10/325 mg up to TID, gabapentin 1200 mg TID. Follows w/ Dr. Holley Raring . Denies concerns w/ constipation (though concurrent diarrhea) . Recommend to continue collaboration w/ Pain Management  Neurologic Conditions (pseudo dementia, tremor, sleep disorder) . Well managed per patient report; current regimen: memantine 10 mg BID, donepezil 10 mg daily; primidone 250 mg QAM, 150 mg QPM; divalproex 500 mg BID, quetiapine 150 mg QPM; Follows w/ Dr. Melrose Nakayama.  . Primidone may decrease concentrations of quetiapine. Continue to monitor for impact of drug interactions if doses adjusted.  . Additional trazodone per psychiatry. . Recommended to continue current regimen at this time along with collaboration w/ Neurology . Continue to monitor for risk of falls with multiple CNS sedating medications.  Chronic Diarrhea/Colitis: . Controlled per patient report; current regimen: budesonide 9 mg daily, Lomotil 2 tabs up to QID PRN; follows w/ [facility] . Recommended to continue current regimen at this time along with collaboration w/ GI.   OAB/BPH: . Improved; current treatment regimen: Mybetriq 50 mg daily, tamsulosin 0.4 mg daily; follows w/ Dr. Diamantina Providence . Extensive discussion at last visit about minimizing artificial sweeteners due to impact on urinary symptoms. Patient notes this is difficult for him.  . Continue current regimen along with collaboration with urology at this time. Encouraged to adhere to non-pharmacologic recommendations as per urology   Patient Goals/Self-Care Activities . Over the next 90 days, patient will:  - take  medications as prescribed check glucose at least 3 times daily using CGM, document, and provide at future appointments collaborate with provider on medication access solutions engage in dietary modifications by reducing carbohydrate portion sizes Focus on tobacco cessation  Follow Up Plan: Telephone follow up appointment with care management team member scheduled for: ~ 12 weeks     Medication Assistance: Ozempic obtained through [employer] medication assistance program.  Enrollment ends 04/19/21  Patient's preferred pharmacy is:  [facility], [place] [address] [place] [address] Phone: [phone] Fax: [phone]  [id number] [name], [place] [address] [place] [address] Phone: [phone] Fax: [phone]  [facility] [address], [place] [address] Phone: [phone] Fax: [phone]    Follow Up:  Patient agrees to Care Plan and Follow-up.  Plan: Telephone follow up appointment with care management team member scheduled for:  ~ 12 weeks  Catie Darnelle Maffucci, PharmD, [place], [place] Clinical Pharmacist [employer] at [employer] [phone]

## 2020-09-13 NOTE — Patient Instructions (Signed)
Visit Information  PATIENT GOALS: Goals Addressed              This Visit's Progress     Patient Stated   .  Medication Monitoring (pt-stated)        Patient Goals/Self-Care Activities . Over the next 30 days, patient will:  - take medications as prescribed collaborate with provider on medication access solutions Remain tobacco free        Patient verbalizes understanding of instructions provided today and agrees to view in [place].   Plan: Telephone follow up appointment with care management team member scheduled for:  ~ 12 weeks  Catie Darnelle Maffucci, PharmD, [place], [place] Clinical Pharmacist [employer] at [employer] [phone]

## 2020-09-14 ENCOUNTER — Telehealth: Payer: PPO

## 2020-09-14 DIAGNOSIS — K529 Noninfective gastroenteritis and colitis, unspecified: Secondary | ICD-10-CM | POA: Diagnosis not present

## 2020-09-14 DIAGNOSIS — K2270 Barrett's esophagus without dysplasia: Secondary | ICD-10-CM | POA: Diagnosis not present

## 2020-09-14 DIAGNOSIS — Z86010 Personal history of colonic polyps: Secondary | ICD-10-CM | POA: Diagnosis not present

## 2020-09-14 DIAGNOSIS — R197 Diarrhea, unspecified: Secondary | ICD-10-CM | POA: Diagnosis not present

## 2020-09-15 ENCOUNTER — Encounter: Payer: PPO | Admitting: Physical Therapy

## 2020-09-19 ENCOUNTER — Encounter: Payer: PPO | Admitting: Physical Therapy

## 2020-09-20 ENCOUNTER — Telehealth: Payer: Self-pay | Admitting: *Deleted

## 2020-09-20 NOTE — Telephone Encounter (Signed)
   Telephone encounter was:  Successful.  09/20/2020 Name: JAZZ ROGALA MRN: [id number] DOB: Sep 20, 1940  [name] is a 80 y.o. year old male who is a primary care patient of Caryl Bis, Angela Adam, MD . The community resource team was consulted for assistance with Financial Difficulties related to copays for pt   Care guide performed the following interventions: Patient provided with information about care guide support team and interviewed to confirm resource needs Follow up call placed to community resources to determine status of patients referral Follow up call placed to the patient to discuss status of referral.  Follow Up Plan:  No further follow up planned at this time. The patient has been provided with needed resources. Patient needs 30 for about 10 visits to physical therapy unfortunatedly having checked with the [employer] they are not able to help and I do not have any other financial resources for patient whose income is over most median income program  [place], Care Management  [phone] [address] , [place] [address] Email : [email or web address]

## 2020-09-22 ENCOUNTER — Encounter: Payer: PPO | Admitting: Physical Therapy

## 2020-09-27 ENCOUNTER — Encounter: Payer: PPO | Admitting: Physical Therapy

## 2020-09-28 ENCOUNTER — Telehealth: Payer: Self-pay | Admitting: Family Medicine

## 2020-09-28 NOTE — Telephone Encounter (Signed)
Patient called and states the Myrbetriq 50mg  is to expensive and is requesting a different medication.

## 2020-09-29 ENCOUNTER — Encounter: Payer: PPO | Admitting: Physical Therapy

## 2020-09-29 NOTE — Telephone Encounter (Signed)
Can you tell him about the coupon that comes in the mybetriq sample packages? If that doesn't work we can try vibegron 75mg  daily samples and the [facility]  Thanks Nickolas Madrid, MD 09/29/2020

## 2020-09-29 NOTE — Telephone Encounter (Signed)
Called pt informed him that I went online and applied for a coupon for him for Myrbetriq. Offered pt to come by office to pick up coupon he states that he has plenty of samples and would just like me to mail coupon. Coupon mailed. Of note pt states his co-pay for Myrbetriq is $103.00 for 30/day supply.

## 2020-09-30 ENCOUNTER — Other Ambulatory Visit: Payer: Self-pay

## 2020-09-30 ENCOUNTER — Ambulatory Visit: Payer: PPO | Admitting: Podiatry

## 2020-09-30 ENCOUNTER — Encounter: Payer: Self-pay | Admitting: Podiatry

## 2020-09-30 DIAGNOSIS — B351 Tinea unguium: Secondary | ICD-10-CM

## 2020-09-30 DIAGNOSIS — M79675 Pain in left toe(s): Secondary | ICD-10-CM | POA: Diagnosis not present

## 2020-09-30 DIAGNOSIS — M79674 Pain in right toe(s): Secondary | ICD-10-CM

## 2020-09-30 NOTE — Progress Notes (Signed)
   SUBJECTIVE Patient presents to office today complaining of elongated, thickened nails that cause pain while ambulating in shoes.  He is unable to trim his own nails.  He also has pain and tenderness to the lateral border of the bilateral great toenails.  He is concerned for possible ingrown.  Patient is here for further evaluation and treatment.  Past Medical History:  Diagnosis Date  . Anxiety   . Anxiety and depression   . Arthritis   . BPH (benign prostatic hyperplasia)   . 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 Colon polyps   . Dementia ([place])   . Depression   . Diabetes mellitus type 2 in nonobese (HCC)   . GERD (gastroesophageal reflux disease)    BARRETTS ESOPHAGUS RESOLVED PER PATIENT  . Headache   . 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[place])   . History of hiatal hernia   . Hypercholesteremia   . Hypertension   . Hyperthyroidism   . Neuropathic pain   . Primary localized osteoarthritis of right knee   . 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 Stomach ulcer   . Tremor, essential   . Wound infection complicating hardware ([place]) 04/02/2018    OBJECTIVE General Patient is awake, alert, and oriented x 3 and in no acute distress. Derm Skin is dry and supple bilateral. Negative open lesions or macerations. Remaining integument unremarkable. Nails are tender, long, thickened and dystrophic with subungual debris, consistent with onychomycosis, 1-5 bilateral. No signs of infection noted. Vasc  DP and PT pedal pulses palpable bilaterally. Temperature gradient within normal limits.  Neuro Epicritic and protective threshold sensation grossly intact bilaterally.  Musculoskeletal Exam No symptomatic pedal deformities noted bilateral. Muscular strength within normal limits.  ASSESSMENT 1. Onychodystrophic nails 1-5 bilateral with hyperkeratosis of nails.  2. Onychomycosis of nail due to dermatophyte bilateral 3. Pain in foot bilateral  PLAN OF CARE 1.  Patient evaluated today.  2. Instructed to maintain good pedal hygiene and foot care.  3. Mechanical debridement of nails 1-5 bilaterally performed using a nail nipper. Filed with dremel without incident.  4. Return to clinic in 3 mos, if the patient did not get significant relief with conservative debridement we may need to perform partial nail matricectomy's to the bilateral great toes lateral borders.    Edrick Kins, DPM [facility]  Dr. Edrick Kins, DPM    [address], [place] [address]                Office [phone]  Fax [phone]

## 2020-10-03 DIAGNOSIS — G479 Sleep disorder, unspecified: Secondary | ICD-10-CM | POA: Diagnosis not present

## 2020-10-03 DIAGNOSIS — F17200 Nicotine dependence, unspecified, uncomplicated: Secondary | ICD-10-CM | POA: Diagnosis not present

## 2020-10-03 DIAGNOSIS — R262 Difficulty in walking, not elsewhere classified: Secondary | ICD-10-CM | POA: Diagnosis not present

## 2020-10-03 DIAGNOSIS — R251 Tremor, unspecified: Secondary | ICD-10-CM | POA: Diagnosis not present

## 2020-10-03 DIAGNOSIS — R4189 Other symptoms and signs involving cognitive functions and awareness: Secondary | ICD-10-CM | POA: Diagnosis not present

## 2020-10-04 ENCOUNTER — Ambulatory Visit: Payer: PPO | Admitting: Pharmacist

## 2020-10-04 ENCOUNTER — Other Ambulatory Visit: Payer: Self-pay

## 2020-10-04 DIAGNOSIS — E1142 Type 2 diabetes mellitus with diabetic polyneuropathy: Secondary | ICD-10-CM

## 2020-10-04 DIAGNOSIS — M545 Low back pain, unspecified: Secondary | ICD-10-CM

## 2020-10-04 DIAGNOSIS — I10 Essential (primary) hypertension: Secondary | ICD-10-CM

## 2020-10-04 DIAGNOSIS — G8929 Other chronic pain: Secondary | ICD-10-CM

## 2020-10-04 DIAGNOSIS — F419 Anxiety disorder, unspecified: Secondary | ICD-10-CM

## 2020-10-04 DIAGNOSIS — N1831 Chronic kidney disease, stage 3a: Secondary | ICD-10-CM

## 2020-10-04 DIAGNOSIS — F32A Depression, unspecified: Secondary | ICD-10-CM

## 2020-10-04 NOTE — Chronic Care Management (AMB) (Signed)
Chronic Care Management Pharmacy Note  10/04/2020 Name:  James Hood MRN:  [id number] DOB:  09/10/1940  Subjective: James Hood is an 80 y.o. year old male who is a primary patient of Caryl Bis, Angela Adam, MD.  The CCM team was consulted for assistance with disease management and care coordination needs.    Engaged with patient by telephone for response to call with question regarding medication management in response to provider referral for pharmacy case management and/or care coordination services.   Consent to Services:  The patient was given information about Chronic Care Management services, agreed to services, and gave verbal consent prior to initiation of services.  Please see initial visit note for detailed documentation.   Patient Care Team: Leone Haven, MD as PCP - General (Family Medicine) De Hollingshead, RPH-CPP as Pharmacist (Pharmacist)  Recent office visits: None since our last visit  Recent consult visits:  4/27 - reduce budesonide 6 mg daily, lomotil 2 QAM, add PRN levsin prior to meals, EGD ordered  5/13 - podiatry   5/16 - neurology [place], continue current regimen  Hospital visits: None in previous 6 months  Objective:  Lab Results  Component Value Date   CREATININE 1.47 09/08/2020   CREATININE 1.21 07/20/2020   CREATININE 1.50 07/05/2020    Lab Results  Component Value Date   HGBA1C 7.1 (H) 09/08/2020   Last diabetic Eye exam:  Lab Results  Component Value Date/Time   HMDIABEYEEXA No Retinopathy 01/27/2020 03:37 PM    Last diabetic Foot exam: No results found for: HMDIABFOOTEX      Component Value Date/Time   CHOL 99 07/05/2020 1209   TRIG 226.0 (H) 07/05/2020 1209   HDL 43.20 07/05/2020 1209   CHOLHDL 2 07/05/2020 1209   VLDL 45.2 (H) 07/05/2020 1209   LDLCALC 57 09/25/2017 0924   LDLDIRECT 31.0 07/05/2020 1209    Hepatic Function Latest Ref Rng & Units 06/08/2020 05/31/2020 05/30/2020  Total Protein 6.5 -  8.1 g/dL 6.9 5.8(L) 7.0  Albumin 3.5 - 5.0 g/dL 3.3(L) 2.8(L) 3.6  AST 15 - 41 U/L 29 19 26   ALT 0 - 44 U/L 19 14 19   Alk Phosphatase 38 - 126 U/L 58 32(L) 35(L)  Total Bilirubin 0.3 - 1.2 mg/dL 0.8 0.7 1.2  Bilirubin, Direct 0.0 - 0.3 mg/dL - - -    Lab Results  Component Value Date/Time   TSH 0.346 (L) 05/31/2020 12:34 AM   TSH 1.36 07/20/2019 09:02 AM   TSH 1.01 02/02/2019 12:15 PM    CBC Latest Ref Rng & Units 06/08/2020 06/01/2020 05/31/2020  WBC 4.0 - 10.5 K/uL 6.5 6.7 11.1(H)  Hemoglobin 13.0 - 17.0 g/dL 11.8(L) 10.2(L) 10.4(L)  Hematocrit 39.0 - 52.0 % 35.7(L) 31.0(L) 31.2(L)  Platelets 150 - 400 K/uL 375 203 202    Lab Results  Component Value Date/Time   VD25OH 42.83 04/07/2019 08:54 AM   VD25OH 24.06 (L) 03/13/2019 10:07 AM    Clinical ASCVD: No  The ASCVD Risk score Mikey Bussing DC Jr., et al., 2013) failed to calculate for the following reasons:   The 2013 ASCVD risk score is only valid for ages 53 to 26     Social History   Tobacco Use  Smoking Status Current Every Day Smoker  . Packs/day: 0.50  . Years: 60.00  . Pack years: 30.00  . Types: Cigarettes  Smokeless Tobacco Never Used   BP Readings from Last 3 Encounters:  08/24/20 117/73  08/22/20 107/70  07/27/20 97/60   Pulse Readings from Last 3 Encounters:  08/24/20 76  08/22/20 81  07/27/20 66   Wt Readings from Last 3 Encounters:  08/24/20 165 lb (74.8 kg)  08/22/20 169 lb 12.8 oz (77 kg)  08/08/20 165 lb (74.8 kg)    Assessment: Review of patient past medical history, allergies, medications, health status, including review of consultants reports, laboratory and other test data, was performed as part of comprehensive evaluation and provision of chronic care management services.   SDOH:  (Social Determinants of Health) assessments and interventions performed:  SDOH Interventions   Flowsheet Row Most Recent Value  SDOH Interventions   Financial Strain Interventions Other (Comment)  [manufacturer  assistance]      CCM Care Plan  No Known Allergies  Medications Reviewed Today    Reviewed by Mack Hook, LPN (Licensed Practical Nurse) on 09/30/20 at [place] List Status: <None>  Medication Order Taking? Sig Documenting Provider Last Dose Status Informant  Alpha-Lipoic Acid 600 MG CAPS [id number]  Take 1 capsule (600 mg total) by mouth daily. [name], MD  Active   aspirin EC 81 MG tablet [id number]  Take 81 mg by mouth daily. [provider]  Active            Med Note Vanessa [place], SUSAN   Tue Apr 15, 2018  8:47 AM)    budesonide (ENTOCORT EC) 3 MG 24 hr capsule [id number]  Take 9 mg by mouth daily. [provider]  Active   buPROPion (WELLBUTRIN XL) 300 MG 24 hr tablet [id number]  Take 1 tablet (300 mg total) by mouth daily. Norman Clay, MD  Active   Continuous Blood Gluc Sensor (FREESTYLE LIBRE 14 DAY SENSOR) MISC [id number]  APPLY ONE SENSOR EVERY 14 DAYS TO MONITOR BLOOD GLUCOSE LEVELS. REMOVE OLD SENSOR BEFORE APPLYING NEW SENSOR Leone Haven, MD  Active   diphenoxylate-atropine (LOMOTIL) 2.5-0.025 MG tablet [id number]  Take by mouth. [provider]  Active   divalproex (DEPAKOTE ER) 500 MG 24 hr tablet [id number]  Take 1 tablet by mouth 2 (two) times daily. [provider]  Active   donepezil (ARICEPT) 10 MG tablet [id number]  Take 10 mg by mouth at bedtime. [provider]  Active Self  fenofibrate (TRICOR) 145 MG tablet [id number]  TAKE ONE TABLET BY MOUTH DAILY Leone Haven, MD  Active   finasteride (PROSCAR) 5 MG tablet [id number]  TAKE ONE TABLET BY MOUTH DAILY. Leone Haven, MD  Active   gabapentin (NEURONTIN) 600 MG tablet [id number]  TAKE TWO TABLETS BY MOUTH THREE TIMES A DAY Leone Haven, MD  Active   HYDROcodone-acetaminophen [facility]) 10-325 MG tablet [id number]  Take 1 tablet by mouth every 4 (four) hours as needed for severe pain. Must last 30 days. [name], MD  Active   Hyoscyamine Sulfate SL  0.125 MG SUBL [id number] Yes Place under the tongue. [provider]  Active   icosapent Ethyl (VASCEPA) 1 g capsule [id number]  Take 2 capsules (2 g total) by mouth 2 (two) times daily. Leone Haven, MD  Active   JARDIANCE 25 MG TABS tablet [id number]  TAKE ONE TABLET BY MOUTH DAILY Leone Haven, MD  Active   lisinopril-hydrochlorothiazide (ZESTORETIC) 20-25 MG tablet [id number]  TAKE ONE TABLET BY MOUTH TWICE A DAY Leone Haven, MD  Active   memantine (NAMENDA) 10 MG tablet [id number]  TAKE ONE TABLET BY MOUTH TWICE A DAY [provider]  Active  metoprolol succinate (TOPROL-XL) 25 MG 24 hr tablet [id number]  Take 25 mg by mouth daily. [provider]  Active   mirabegron ER (MYRBETRIQ) 50 MG TB24 tablet [id number]  Take 1 tablet (50 mg total) by mouth daily. Billey Co, MD  Active   Multiple Vitamins-Minerals (CENTRUM SILVER PO) [id number]  Take 1 tablet by mouth daily. [provider]  Active   omeprazole (PRILOSEC) 20 MG capsule [id number]  Take 20 mg by mouth 2 (two) times daily before a meal.  [provider]  Active Self           Med Note Josiah Lobo, KIMBERLY   Thu Aug 09, 2016 12:05 PM)    primidone (MYSOLINE) 50 MG tablet [id number]  Take 150-250 mg by mouth 2 (two) times daily. Take 250 mg by mouth in the morning & take 150 mg at night. [provider]  Active Self  QUEtiapine (SEROQUEL) 100 MG tablet [id number]  Take 150 mg by mouth at bedtime. [provider]  Active Pharmacy Records  rosuvastatin (CRESTOR) 40 MG tablet [id number]  TAKE ONE TABLET BY MOUTH DAILY Leone Haven, MD  Active   Semaglutide,0.25 or 0.5MG/DOS, (OZEMPIC, 0.25 OR 0.5 MG/DOSE,) 2 MG/1.5ML SOPN [id number]  Inject 0.5 mg into the skin once a week. Leone Haven, MD  Active Pharmacy Records  simvastatin [facility]) 40 MG tablet [id number] Yes Take by mouth. [provider]  Active   tamsulosin (FLOMAX) 0.4 MG CAPS capsule  [id number]  TAKE ONE CAPSULE BY MOUTH DAILY AFTER Shawna Clamp, Randell Patient, MD  Active   traZODone (DESYREL) 100 MG tablet [id number]  100-150 mg at night as needed for sleep Norman Clay, MD  Active   Med List Note Dewayne Shorter, RN 07/27/20 0915): [id number] UDS 10-06-2019          Patient Active Problem List   Diagnosis Date Noted  . OAB (overactive bladder) 08/08/2020  . AKI (acute kidney injury) ([place]) 08/08/2020  . Leg cramps 08/08/2020  . Prolonged Q-T interval on ECG 06/08/2020  . CAP (community acquired pneumonia) 05/30/2020  . 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 Right leg pain 02/05/2020  . Impingement of left ankle joint   . Sleeping difficulty 08/24/2019  . Hiccups 08/24/2019  . Chronic pain of left ankle 02/27/2019  . Vitamin D deficiency 02/18/2019  . Left shoulder pain 02/02/2019  . Neuropathy 09/24/2018  . Lumbosacral radiculopathy 01/14/2018  . 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 Facet arthritis of lumbar region 01/14/2018  . Chronic pain syndrome 08/26/2017  . 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 Thyroid nodule 02/27/2017  . Fatty liver 02/27/2017  . Purpura ([place]) 01/29/2017  . Right hip pain 08/16/2016  . Lumbar herniated disc 02/03/2016  . Hypertriglyceridemia 12/09/2015  . Anemia 12/09/2015  . Falls 09/16/2015  . Barrett's esophagus 09/16/2015  . Chronic lumbar pain 09/16/2015  . Benign prostatic hyperplasia 09/16/2015  . Trochanteric bursitis of left hip 09/09/2015  . Headache 09/09/2015  . Difficulty in walking 06/01/2015  . Amnesia 06/01/2015  . HLD (hyperlipidemia) 02/23/2015  . DJD (degenerative joint disease) of knee 11/15/2014  . Hypertension   . Tremor, essential   . Primary localized osteoarthritis of right knee   . Anxiety and depression   . Absence of sensation 11/18/2013  . Tobacco abuse 11/18/2013  . Chronic diarrhea 11/12/2013  . Benign neoplasm of colon 08/25/2013  . 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 Type 2  diabetes mellitus ([place]) 10/06/2012  Immunization History  Administered Date(s) Administered  . Fluad Quad(high Dose 65+) 02/05/2020  . Hepatitis A, Adult 12/02/2018  . Influenza, High Dose Seasonal PF 01/29/2017, 03/19/2018, 01/10/2019  . Influenza,inj,Quad PF,6+ Mos 03/13/2016  . Influenza-Unspecified 01/21/2015  . PFIZER(Purple Top)SARS-COV-2 Vaccination 06/01/2019, 06/22/2019  . Pneumococcal Conjugate-13 02/07/2015  . Pneumococcal Polysaccharide-23 01/18/2018  . Tdap 09/16/2015, 01/18/2018  . Zoster 09/16/2015  . Zoster Recombinat (Shingrix) 01/03/2018, 05/09/2018    Conditions to be addressed/monitored: HTN, HLD, DMII and Depression  Care Plan : Medication Management  Updates made by De Hollingshead, RPH-CPP since 10/04/2020 12:00 AM    Problem: Diabetes, Depression     Long-Range Goal: Disease Progression Prevention   Start Date: 02/16/2020  This Visit's Progress: On track  Recent Progress: On track  Priority: Medium  Note:   Current Barriers:  . Unable to independently afford treatment regimen  Pharmacist Clinical Goal(s):  Marland Kitchen Over the next 30 days, patient will verbalize ability to afford treatment regimen through collaboration with PharmD and provider.  . Over the next 90 days, patient will maintain glycemic control as evidenced by A1c through collaboration with PharmD and provider.  Interventions: . 1:1 collaboration with Leone Haven, MD regarding development and update of comprehensive plan of care as evidenced by provider attestation and co-signature . Inter-disciplinary care team collaboration (see longitudinal plan of care) . Comprehensive medication review performed; medication list updated in electronic medical record  Diabetes: . Unontrolled; current treatment: Jardiance 25 mg daily, Ozempic 0.5 mg weekly (max tolerated dose d/t GI upset/weight loss at 1 mg weekly) o Hx metformin ER - significant diarrhea that resolved upon  discontinuation . Previously recommended to continue current regimen at this time.   Hx tobacco use: Marland Kitchen Patient reports he quit smoking after hospitalization, but has restarted about 2 months ago . Will continue to follow moving forward for patient interest in cessation  Hypertension: . Controlled per last clinic reading; current treatment: lisinopril/HCTZ 20/25 mg daily, metoprolol succinate 25 mg daily . Previously recommended to continue current regimen  Hyperlipidemia: . Controlled; current treatment: rosuvastatin 40 mg daily, fenofibrate 145 mg daily, Vascepa 2 g BID- patient reports he stopped taking due to cost  o [employer] for [employer] . Calls today to report that [employer] expired. Provided information on how to call [employer] to re-enroll in [employer] for hyperlipidemia. Take that information to the pharmacy to use in combination with insurance to reduce Vascepa copay to $0. Patient verbalized understanding.   Depression/insomnia: . Improved at last visit. Current regimen: bupropion XL 300 mg daily, trazodone 100-150 mg QPM. Follows w/ Dr. Modesta Messing and LCSW . Previously recommended to continue current collaboration with psychiatry.   Chronic Pain w/ recent ankle surgery: . Improved per patient report; hydrocodone/APAP 10/325 mg up to TID, gabapentin 1200 mg TID. Follows w/ Dr. Holley Raring . Denies concerns w/ constipation (though concurrent diarrhea) . Previously recommended to continue collaboration w/ Pain Management  Neurologic Conditions (pseudo dementia, tremor, sleep disorder) . Well managed per patient report; current regimen: memantine 10 mg BID, donepezil 10 mg daily; primidone 250 mg QAM, 150 mg QPM; divalproex 500 mg BID, quetiapine 150 mg QPM; Follows w/ Dr. Melrose Nakayama.  . Previously recommended to continue current regimen at this time along with collaboration w/ Neurology  Chronic  Diarrhea/Colitis: . Controlled per patient report; current regimen: budesonide 47m daily, Lomotil 1 tabs up to QAM, hyoscyamine PRN added at last visit; follows w/ [facility] . Previously recommended  to continue current regimen at this time along with collaboration w/ GI.   OAB/BPH: . Improved; current treatment regimen: Mybetriq 50 mg daily, tamsulosin 0.4 mg daily; follows w/ Dr. Diamantina Providence . Per chart review, patient has samples of Mybetriq. Will follow for cost considerations. Coupon provided by urology not going to be beneficial given Medicare status. . Previously recommended to continue current regimen along with collaboration with urology at this time. Encouraged to adhere to non-pharmacologic recommendations as per urology   Patient Goals/Self-Care Activities . Over the next 90 days, patient will:  - take medications as prescribed check glucose at least 3 times daily using CGM, document, and provide at future appointments collaborate with provider on medication access solutions engage in dietary modifications by reducing carbohydrate portion sizes Focus on tobacco cessation  Follow Up Plan: Telephone follow up appointment with care management team member scheduled for: ~8 weeks     Medication Assistance: Ozempic obtained through [employer] medication assistance program.  Enrollment ends 04/19/21. Patient to re-enroll for [facility] assistance.   Patient's preferred pharmacy is:  [place], [place] [address] Phone: [phone] Fax: [phone]  [id number] [name], [place] - [place] [address] Phone: [phone] Fax: [phone]  [facility] [address], [place] - [place] [address] Phone: [phone] Fax: [phone]   Follow Up:   Patient agrees to Care Plan and Follow-up.  Plan: Telephone follow up appointment with care management team member scheduled for:  ~ 8 weeks as previously scheduled  Catie Darnelle Maffucci, PharmD, [place], [place] Clinical Pharmacist [employer] at [employer] [phone]

## 2020-10-04 NOTE — Patient Instructions (Signed)
Visit Information  PATIENT GOALS: Goals Addressed              This Visit's Progress     Patient Stated   .  Medication Monitoring (pt-stated)        Patient Goals/Self-Care Activities . Over the next 30 days, patient will:  - take medications as prescribed collaborate with provider on medication access solutions        Patient verbalizes understanding of instructions provided today and agrees to view in [place].  Plan: Telephone follow up appointment with care management team member scheduled for:  ~ 8 weeks as previously scheduled  Catie Darnelle Maffucci, PharmD, [place], [place] Clinical Pharmacist [employer] at [employer] [phone]

## 2020-10-05 ENCOUNTER — Ambulatory Visit (INDEPENDENT_AMBULATORY_CARE_PROVIDER_SITE_OTHER): Payer: PPO | Admitting: Licensed Clinical Social Worker

## 2020-10-05 DIAGNOSIS — F3341 Major depressive disorder, recurrent, in partial remission: Secondary | ICD-10-CM | POA: Diagnosis not present

## 2020-10-05 NOTE — Progress Notes (Signed)
Virtual Visit via Video Note  I connected with James Hood on 10/05/20 at  8:00 AM EDT by a video enabled telemedicine application and verified that I am speaking with the correct person using two identifiers.  Location: Patient: home Provider: [facility]   I discussed the limitations of evaluation and management by telemedicine and the availability of in person appointments. The patient expressed understanding and agreed to proceed.   I discussed the assessment and treatment plan with the patient. The patient was provided an opportunity to ask questions and all were answered. The patient agreed with the plan and demonstrated an understanding of the instructions.   The patient was advised to call back or seek an in-person evaluation if the symptoms worsen or if the condition fails to improve as anticipated.  I provided 60 minutes of non-face-to-face time during this encounter.  [place], LCSW   THERAPIST PROGRESS NOTE  Session Time:8-9a  Participation Level: Active  Behavioral Response: NAAlertAnxious  Type of Therapy: Individual Therapy  Treatment Goals addressed: Coping  Interventions: Supportive  Summary: James Hood is a 80 y.o. male who presents with improving anxiety symptoms since last session. Pt reports that overall mood is stable.    Discussed overall psychological impact of recent renovations and contractor that did not fulfill contract.   Allowed pt to explore relationships from the past and pt discovered that high school sweetheart died. Allowed pt to explore grief and positive memories.   Discussed pts continued commitments to [place] and sponsoring others with alcoholism. Discussed pts own recovery and coping when he cannot attend meetings.  Continued recommendations are as follows: self care behaviors, positive social engagements, focusing on overall work/home/life balance, and focusing on positive physical and emotional wellness.     Suicidal/Homicidal: No  Therapist Response: James Hood is able to identify an anxiety coping mechanism that has been successful in the past and increase its use. James Hood is able to verbalize an understanding of how relaxation and diversion activities decrease levels of anxiety. James Hood is continuing to develop skills to reduce irritability and increase normal social interaction with family and friends. These behaviors are reflective of both personal growth and progress. Treatment to continue as indicated.  Plan: Return again in 4 weeks.  Diagnosis: Axis I: MDD, recurrent, in partial remission    Axis II: No diagnosis     [place], LCSW 10/05/2020

## 2020-10-11 ENCOUNTER — Ambulatory Visit (INDEPENDENT_AMBULATORY_CARE_PROVIDER_SITE_OTHER): Payer: PPO | Admitting: Pharmacist

## 2020-10-11 ENCOUNTER — Encounter: Payer: Self-pay | Admitting: Family Medicine

## 2020-10-11 ENCOUNTER — Other Ambulatory Visit: Payer: Self-pay

## 2020-10-11 ENCOUNTER — Ambulatory Visit (INDEPENDENT_AMBULATORY_CARE_PROVIDER_SITE_OTHER): Payer: PPO | Admitting: Family Medicine

## 2020-10-11 DIAGNOSIS — K529 Noninfective gastroenteritis and colitis, unspecified: Secondary | ICD-10-CM | POA: Diagnosis not present

## 2020-10-11 DIAGNOSIS — F32A Depression, unspecified: Secondary | ICD-10-CM

## 2020-10-11 DIAGNOSIS — N3281 Overactive bladder: Secondary | ICD-10-CM | POA: Diagnosis not present

## 2020-10-11 DIAGNOSIS — N1831 Chronic kidney disease, stage 3a: Secondary | ICD-10-CM | POA: Diagnosis not present

## 2020-10-11 DIAGNOSIS — F419 Anxiety disorder, unspecified: Secondary | ICD-10-CM

## 2020-10-11 DIAGNOSIS — E041 Nontoxic single thyroid nodule: Secondary | ICD-10-CM

## 2020-10-11 DIAGNOSIS — I10 Essential (primary) hypertension: Secondary | ICD-10-CM | POA: Diagnosis not present

## 2020-10-11 DIAGNOSIS — E1142 Type 2 diabetes mellitus with diabetic polyneuropathy: Secondary | ICD-10-CM | POA: Diagnosis not present

## 2020-10-11 DIAGNOSIS — K22719 Barrett's esophagus with dysplasia, unspecified: Secondary | ICD-10-CM

## 2020-10-11 DIAGNOSIS — R5382 Chronic fatigue, unspecified: Secondary | ICD-10-CM

## 2020-10-11 DIAGNOSIS — R5383 Other fatigue: Secondary | ICD-10-CM | POA: Insufficient documentation

## 2020-10-11 LAB — CBC WITH DIFFERENTIAL/PLATELET
Basophils Absolute: 0 10*3/uL (ref 0.0–0.1)
Basophils Relative: 0.5 % (ref 0.0–3.0)
Eosinophils Absolute: 0 10*3/uL (ref 0.0–0.7)
Eosinophils Relative: 0.6 % (ref 0.0–5.0)
HCT: 37.5 % — ABNORMAL LOW (ref 39.0–52.0)
Hemoglobin: 12.6 g/dL — ABNORMAL LOW (ref 13.0–17.0)
Lymphocytes Relative: 31.4 % (ref 12.0–46.0)
Lymphs Abs: 1.8 10*3/uL (ref 0.7–4.0)
MCHC: 33.5 g/dL (ref 30.0–36.0)
MCV: 94.3 fl (ref 78.0–100.0)
Monocytes Absolute: 0.4 10*3/uL (ref 0.1–1.0)
Monocytes Relative: 7.4 % (ref 3.0–12.0)
Neutro Abs: 3.5 10*3/uL (ref 1.4–7.7)
Neutrophils Relative %: 60.1 % (ref 43.0–77.0)
Platelets: 249 10*3/uL (ref 150.0–400.0)
RBC: 3.97 Mil/uL — ABNORMAL LOW (ref 4.22–5.81)
RDW: 13.4 % (ref 11.5–15.5)
WBC: 5.8 10*3/uL (ref 4.0–10.5)

## 2020-10-11 LAB — COMPREHENSIVE METABOLIC PANEL
ALT: 11 U/L (ref 0–53)
AST: 15 U/L (ref 0–37)
Albumin: 4.1 g/dL (ref 3.5–5.2)
Alkaline Phosphatase: 41 U/L (ref 39–117)
BUN: 32 mg/dL — ABNORMAL HIGH (ref 6–23)
CO2: 28 mEq/L (ref 19–32)
Calcium: 9.8 mg/dL (ref 8.4–10.5)
Chloride: 100 mEq/L (ref 96–112)
Creatinine, Ser: 1.11 mg/dL (ref 0.40–1.50)
GFR: 62.63 mL/min (ref >60.00)
Glucose, Bld: 138 mg/dL — ABNORMAL HIGH (ref 70–99)
Potassium: 4.4 mEq/L (ref 3.5–5.1)
Sodium: 137 mEq/L (ref 135–145)
Total Bilirubin: 0.3 mg/dL (ref 0.2–1.2)
Total Protein: 6.2 g/dL (ref 6.0–8.3)

## 2020-10-11 LAB — TSH: TSH: 0.97 u[IU]/mL (ref 0.35–4.50)

## 2020-10-11 LAB — VITAMIN D 25 HYDROXY (VIT D DEFICIENCY, FRACTURES): VITD: 24.57 ng/mL — ABNORMAL LOW (ref 30.00–100.00)

## 2020-10-11 LAB — VITAMIN B12: Vitamin B-12: >1550 pg/mL — ABNORMAL HIGH (ref 211–911)

## 2020-10-11 NOTE — Assessment & Plan Note (Addendum)
Patient's glucose has been generally well controlled.  I encouraged him to cut down on his carbohydrates in the morning.  He will continue Ozempic 0.5 mg once weekly and Jardiance 25 mg daily.  Plan for A1c at next visit. Encouraged the patient to check his glucose at least 3 times a day with his freestyle libre.

## 2020-10-11 NOTE — Assessment & Plan Note (Signed)
Most recent ultrasound advised no further follow-up needed on the previously biopsied thyroid nodule.

## 2020-10-11 NOTE — Progress Notes (Signed)
Tommi Rumps, MD Phone: [phone]  James Hood is a 80 y.o. male who presents today for f/u.  DIABETES Disease Monitoring: Blood Sugar ranges-90% in range, 10% above range, checking 1-4x/day with his freestyle libre polyuria/phagia/dipsia- no      Optho- UTD Medications: Compliance- taking jardiance, ozempic Hypoglycemic symptoms- no Patient has eats breakfast at [facility].  He gets a big bowl of grits with sausage, eggs, and gravy.  That is typically when his blood sugar spikes the most throughout the day.  Fatigue: Patient notes this has been an ongoing issue.  He has not been doing much outside of the house other than going to [employer].  He sits a lot and does not get much activity.  He is starting to plan things to help him get out of the house.  He denies depression.  Chronic diarrhea: Patient notes this is significantly better.  He has weight loss related to this.  His weight has been relatively stable recently.  He notes GI has cut down on the Lomotil.  He remains on budesonide.  He is also on hyoscyamine.  Overactive bladder: Patient saw urology.  They placed him on Myrbetriq which has been beneficial for his urinary urgency.  He notes the cost is slightly excessive though he does have a coupon that reduces the cost of $30 a month.   Social History   Tobacco Use  Smoking Status Current Every Day Smoker  . Packs/day: 0.50  . Years: 60.00  . Pack years: 30.00  . Types: Cigarettes  Smokeless Tobacco Never Used    Current Outpatient Medications on File Prior to Visit  Medication Sig Dispense Refill  . Alpha-Lipoic Acid 600 MG CAPS Take 1 capsule (600 mg total) by mouth daily. 30 capsule 2  . aspirin EC 81 MG tablet Take 81 mg by mouth daily.    . budesonide (ENTOCORT EC) 3 MG 24 hr capsule Take 9 mg by mouth daily.    Marland Kitchen buPROPion (WELLBUTRIN XL) 300 MG 24 hr tablet Take 1 tablet (300 mg total) by mouth daily. 90 tablet 0  . Continuous Blood Gluc Sensor  (FREESTYLE LIBRE 14 DAY SENSOR) MISC APPLY ONE SENSOR EVERY 14 DAYS TO MONITOR BLOOD GLUCOSE LEVELS. REMOVE OLD SENSOR BEFORE APPLYING NEW SENSOR 1 each 5  . diphenoxylate-atropine (LOMOTIL) 2.5-0.025 MG tablet Take by mouth.    . divalproex (DEPAKOTE ER) 500 MG 24 hr tablet Take 1 tablet by mouth 2 (two) times daily.    Marland Kitchen donepezil (ARICEPT) 10 MG tablet Take 10 mg by mouth at bedtime.    . fenofibrate (TRICOR) 145 MG tablet TAKE ONE TABLET BY MOUTH DAILY 90 tablet 1  . finasteride (PROSCAR) 5 MG tablet TAKE ONE TABLET BY MOUTH DAILY. 90 tablet 1  . gabapentin (NEURONTIN) 600 MG tablet TAKE TWO TABLETS BY MOUTH THREE TIMES A DAY 180 tablet 1  . HYDROcodone-acetaminophen (NORCO) 10-325 MG tablet Take 1 tablet by mouth every 4 (four) hours as needed for severe pain. Must last 30 days. 180 tablet 0  . Hyoscyamine Sulfate SL 0.125 MG SUBL Place under the tongue.    Marland Kitchen icosapent Ethyl (VASCEPA) 1 g capsule Take 2 capsules (2 g total) by mouth 2 (two) times daily. 360 capsule 3  . JARDIANCE 25 MG TABS tablet TAKE ONE TABLET BY MOUTH DAILY 30 tablet 5  . lisinopril-hydrochlorothiazide (ZESTORETIC) 20-25 MG tablet TAKE ONE TABLET BY MOUTH TWICE A DAY 180 tablet 1  . memantine (NAMENDA) 10 MG tablet  TAKE ONE TABLET BY MOUTH TWICE A DAY    . metoprolol succinate (TOPROL-XL) 25 MG 24 hr tablet Take 25 mg by mouth daily.    . mirabegron ER (MYRBETRIQ) 50 MG TB24 tablet Take 1 tablet (50 mg total) by mouth daily. 30 tablet 11  . Multiple Vitamins-Minerals (CENTRUM SILVER PO) Take 1 tablet by mouth daily.    Marland Kitchen omeprazole (PRILOSEC) 20 MG capsule Take 20 mg by mouth 2 (two) times daily before a meal.     . primidone (MYSOLINE) 50 MG tablet Take 150-250 mg by mouth 2 (two) times daily. Take 250 mg by mouth in the morning & take 150 mg at night.    . QUEtiapine (SEROQUEL) 100 MG tablet Take 150 mg by mouth at bedtime.    . rosuvastatin (CRESTOR) 40 MG tablet TAKE ONE TABLET BY MOUTH DAILY 90 tablet 0  .  Semaglutide,0.25 or 0.5MG/DOS, (OZEMPIC, 0.25 OR 0.5 MG/DOSE,) 2 MG/1.5ML SOPN Inject 0.5 mg into the skin once a week. 1.5 mL 3  . simvastatin (ZOCOR) 40 MG tablet Take by mouth.    . tamsulosin (FLOMAX) 0.4 MG CAPS capsule TAKE ONE CAPSULE BY MOUTH DAILY AFTER DINNER 90 capsule 0  . traZODone (DESYREL) 100 MG tablet 100-150 mg at night as needed for sleep 135 tablet 0   No current facility-administered medications on file prior to visit.     ROS see history of present illness  Objective  Physical Exam Vitals:   10/11/20 0929  BP: 120/80  Pulse: 64  Temp: 97.6 F (36.4 C)  SpO2: 97%    BP Readings from Last 3 Encounters:  10/11/20 120/80  08/24/20 117/73  07/27/20 97/60   Wt Readings from Last 3 Encounters:  10/11/20 165 lb 9.6 oz (75.1 kg)  08/24/20 165 lb (74.8 kg)  08/08/20 165 lb (74.8 kg)    Physical Exam Constitutional:      General: He is not in acute distress.    Appearance: He is not diaphoretic.  Cardiovascular:     Rate and Rhythm: Normal rate and regular rhythm.     Heart sounds: Normal heart sounds.  Pulmonary:     Effort: Pulmonary effort is normal.     Breath sounds: Normal breath sounds.  Musculoskeletal:     Right lower leg: No edema.     Left lower leg: No edema.  Skin:    General: Skin is warm and dry.  Neurological:     Mental Status: He is alert.      Assessment/Plan: Please see individual problem list.  Problem List Items Addressed This Visit    Barrett's esophagus    It appears he is scheduled for a EGD in the near future.      Chronic diarrhea    Significantly improved.  He will continue management through gastroenterology.      Type 2 diabetes mellitus (HCC)    Patient's glucose has been generally well controlled.  I encouraged him to cut down on his carbohydrates in the morning.  He will continue Ozempic 0.5 mg once weekly and Jardiance 25 mg daily.  Plan for A1c at next visit. Encouraged the patient to check his glucose at  least 3 times a day with his freestyle libre.      Thyroid nodule    Most recent ultrasound advised no further follow-up needed on the previously biopsied thyroid nodule.      OAB (overactive bladder)    Improved on Myrbetriq 50 mg once daily.  He will continue this.  We will send a message to our clinical pharmacist to see if there is any assistance options for this.  Blood pressure is stable on this.      Fatigue    I suspect this is related to inactivity.  He will try to do this several things to get out of the house and to be more active.  We will check lab work to evaluate other causes.      Relevant Orders   Comp Met (CMET)   CBC w/Diff   TSH   B12   Vitamin D (25 hydroxy)      Return in about 3 months (around 01/11/2021) for Diabetes.  This visit occurred during the SARS-CoV-2 public health emergency.  Safety protocols were in place, including screening questions prior to the visit, additional usage of staff PPE, and extensive cleaning of exam room while observing appropriate contact time as indicated for disinfecting solutions.    Tommi Rumps, MD [place]

## 2020-10-11 NOTE — Assessment & Plan Note (Signed)
Significantly improved.  He will continue management through gastroenterology.

## 2020-10-11 NOTE — Assessment & Plan Note (Signed)
I suspect this is related to inactivity.  He will try to do this several things to get out of the house and to be more active.  We will check lab work to evaluate other causes.

## 2020-10-11 NOTE — Chronic Care Management (AMB) (Signed)
Chronic Care Management Pharmacy Note  10/11/2020 Name:  QUOC TOME MRN:  [id number] DOB:  08/16/1940  Subjective: James Hood is an 80 y.o. year old male who is a primary patient of Caryl Bis, Angela Adam, MD.  The CCM team was consulted for assistance with disease management and care coordination needs.    Engaged with patient by telephone for follow up visit in response to provider referral for pharmacy case management and/or care coordination services.   Consent to Services:  The patient was given information about Chronic Care Management services, agreed to services, and gave verbal consent prior to initiation of services.  Please see initial visit note for detailed documentation.   Patient Care Team: Leone Haven, MD as PCP - General (Family Medicine) De Hollingshead, RPH-CPP as Pharmacist (Pharmacist)  Recent office visits: PCP visit today  Recent consult visits:  5/18 - LCSW visit  Hospital visits: None in previous 6 months  Objective:  Lab Results  Component Value Date   CREATININE 1.47 09/08/2020   CREATININE 1.21 07/20/2020   CREATININE 1.50 07/05/2020    Lab Results  Component Value Date   HGBA1C 7.1 (H) 09/08/2020   Last diabetic Eye exam:  Lab Results  Component Value Date/Time   HMDIABEYEEXA No Retinopathy 01/27/2020 03:37 PM    Last diabetic Foot exam: No results found for: HMDIABFOOTEX      Component Value Date/Time   CHOL 99 07/05/2020 1209   TRIG 226.0 (H) 07/05/2020 1209   HDL 43.20 07/05/2020 1209   CHOLHDL 2 07/05/2020 1209   VLDL 45.2 (H) 07/05/2020 1209   LDLCALC 57 09/25/2017 0924   LDLDIRECT 31.0 07/05/2020 1209    Hepatic Function Latest Ref Rng & Units 06/08/2020 05/31/2020 05/30/2020  Total Protein 6.5 - 8.1 g/dL 6.9 5.8(L) 7.0  Albumin 3.5 - 5.0 g/dL 3.3(L) 2.8(L) 3.6  AST 15 - 41 U/L 29 19 26   ALT 0 - 44 U/L 19 14 19   Alk Phosphatase 38 - 126 U/L 58 32(L) 35(L)  Total Bilirubin 0.3 - 1.2 mg/dL 0.8 0.7  1.2  Bilirubin, Direct 0.0 - 0.3 mg/dL - - -    Lab Results  Component Value Date/Time   TSH 0.346 (L) 05/31/2020 12:34 AM   TSH 1.36 07/20/2019 09:02 AM   TSH 1.01 02/02/2019 12:15 PM    CBC Latest Ref Rng & Units 06/08/2020 06/01/2020 05/31/2020  WBC 4.0 - 10.5 K/uL 6.5 6.7 11.1(H)  Hemoglobin 13.0 - 17.0 g/dL 11.8(L) 10.2(L) 10.4(L)  Hematocrit 39.0 - 52.0 % 35.7(L) 31.0(L) 31.2(L)  Platelets 150 - 400 K/uL 375 203 202    Lab Results  Component Value Date/Time   VD25OH 42.83 04/07/2019 08:54 AM   VD25OH 24.06 (L) 03/13/2019 10:07 AM    Clinical ASCVD: No    Social History   Tobacco Use  Smoking Status Current Every Day Smoker  . Packs/day: 0.50  . Years: 60.00  . Pack years: 30.00  . Types: Cigarettes  Smokeless Tobacco Never Used   BP Readings from Last 3 Encounters:  10/11/20 120/80  08/24/20 117/73  08/22/20 107/70   Pulse Readings from Last 3 Encounters:  10/11/20 64  08/24/20 76  08/22/20 81   Wt Readings from Last 3 Encounters:  10/11/20 165 lb 9.6 oz (75.1 kg)  08/24/20 165 lb (74.8 kg)  08/22/20 169 lb 12.8 oz (77 kg)    Assessment: Review of patient past medical history, allergies, medications, health status, including review of consultants reports,  laboratory and other test data, was performed as part of comprehensive evaluation and provision of chronic care management services.   SDOH:  (Social Determinants of Health) assessments and interventions performed:    CCM Care Plan  No Known Allergies  Medications Reviewed Today    Reviewed by Gordy Councilman, CMA (Certified Medical Assistant) on 10/11/20 at 0930  Med List Status: <None>  Medication Order Taking? Sig Documenting Provider Last Dose Status Informant  Alpha-Lipoic Acid 600 MG CAPS [id number] Yes Take 1 capsule (600 mg total) by mouth daily. [name], MD Taking Active   aspirin EC 81 MG tablet [id number] Yes Take 81 mg by mouth daily. [provider] Taking Active             Med Note Vanessa Tyaskin, SUSAN   Tue Apr 15, 2018  8:47 AM)    budesonide (ENTOCORT EC) 3 MG 24 hr capsule [id number] Yes Take 9 mg by mouth daily. [provider] Taking Active   buPROPion (WELLBUTRIN XL) 300 MG 24 hr tablet [id number] Yes Take 1 tablet (300 mg total) by mouth daily. Norman Clay, MD Taking Active   Continuous Blood Gluc Sensor (FREESTYLE LIBRE 14 DAY SENSOR) MISC [id number] Yes APPLY ONE SENSOR EVERY 14 DAYS TO MONITOR BLOOD GLUCOSE LEVELS. REMOVE OLD SENSOR BEFORE APPLYING NEW SENSOR Leone Haven, MD Taking Active   diphenoxylate-atropine (LOMOTIL) 2.5-0.025 MG tablet [id number] Yes Take by mouth. [provider] Taking Active   divalproex (DEPAKOTE ER) 500 MG 24 hr tablet [id number] Yes Take 1 tablet by mouth 2 (two) times daily. [provider] Taking Active   donepezil (ARICEPT) 10 MG tablet [id number] Yes Take 10 mg by mouth at bedtime. [provider] Taking Active Self  fenofibrate (TRICOR) 145 MG tablet [id number] Yes TAKE ONE TABLET BY MOUTH DAILY Leone Haven, MD Taking Active   finasteride (PROSCAR) 5 MG tablet [id number] Yes TAKE ONE TABLET BY MOUTH DAILY. Leone Haven, MD Taking Active   gabapentin (NEURONTIN) 600 MG tablet [id number] Yes TAKE TWO TABLETS BY MOUTH THREE TIMES A DAY Leone Haven, MD Taking Active   HYDROcodone-acetaminophen [facility]) 10-325 MG tablet [id number] Yes Take 1 tablet by mouth every 4 (four) hours as needed for severe pain. Must last 30 days. [name], MD Taking Active   Hyoscyamine Sulfate SL 0.125 MG SUBL [id number] Yes Place under the tongue. [provider] Taking Active   icosapent Ethyl (VASCEPA) 1 g capsule [id number] Yes Take 2 capsules (2 g total) by mouth 2 (two) times daily. Leone Haven, MD Taking Active   JARDIANCE 25 MG TABS tablet [id number] Yes TAKE ONE TABLET BY MOUTH DAILY Leone Haven, MD Taking Active   lisinopril-hydrochlorothiazide (ZESTORETIC) 20-25 MG  tablet [id number] Yes TAKE ONE TABLET BY MOUTH TWICE A DAY Leone Haven, MD Taking Active   memantine (NAMENDA) 10 MG tablet [id number] Yes TAKE ONE TABLET BY MOUTH TWICE A DAY [provider] Taking Active   metoprolol succinate (TOPROL-XL) 25 MG 24 hr tablet [id number] Yes Take 25 mg by mouth daily. [provider] Taking Active   mirabegron ER (MYRBETRIQ) 50 MG TB24 tablet [id number] Yes Take 1 tablet (50 mg total) by mouth daily. Billey Co, MD Taking Active   Multiple Vitamins-Minerals (CENTRUM SILVER PO) [id number] Yes Take 1 tablet by mouth daily. [provider] Taking Active   omeprazole (PRILOSEC) 20 MG capsule [id number] Yes Take 20 mg by mouth 2 (two) times daily before a meal.  [provider] Taking Active Self           Med Note Josiah Lobo, KIMBERLY   Thu Aug 09, 2016 12:05 PM)    primidone (MYSOLINE) 50 MG tablet [id number] Yes Take 150-250 mg by mouth 2 (two) times daily. Take 250 mg by mouth in the morning & take 150 mg at night. [provider] Taking Active Self  QUEtiapine (SEROQUEL) 100 MG tablet [id number] Yes Take 150 mg by mouth at bedtime. [provider] Taking Active Pharmacy Records  rosuvastatin (CRESTOR) 40 MG tablet [id number] Yes TAKE ONE TABLET BY MOUTH DAILY Leone Haven, MD Taking Active   Semaglutide,0.25 or 0.5MG/DOS, (OZEMPIC, 0.25 OR 0.5 MG/DOSE,) 2 MG/1.5ML SOPN [id number] Yes Inject 0.5 mg into the skin once a week. Leone Haven, MD Taking Active Pharmacy Records  simvastatin (ZOCOR) 40 MG tablet [id number] Yes Take by mouth. [provider] Taking Active   tamsulosin (FLOMAX) 0.4 MG CAPS capsule [id number] Yes TAKE ONE CAPSULE BY MOUTH DAILY AFTER Shawna Clamp, Randell Patient, MD Taking Active   traZODone (DESYREL) 100 MG tablet [id number] Yes 100-150 mg at night as needed for sleep Norman Clay, MD Taking Active   Med List Note Dewayne Shorter, RN 07/27/20 0915): [id number] UDS 10-06-2019           Patient Active Problem List   Diagnosis Date Noted  . Fatigue 10/11/2020  . OAB (overactive bladder) 08/08/2020  . AKI (acute kidney injury) ([place]) 08/08/2020  . Leg cramps 08/08/2020  . Prolonged Q-T interval on ECG 06/08/2020  . 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 Right leg pain 02/05/2020  . Impingement of left ankle joint   . Sleeping difficulty 08/24/2019  . Hiccups 08/24/2019  . Chronic pain of left ankle 02/27/2019  . Vitamin D deficiency 02/18/2019  . Left shoulder pain 02/02/2019  . Neuropathy 09/24/2018  . Lumbosacral radiculopathy 01/14/2018  . 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 Facet arthritis of lumbar region 01/14/2018  . Chronic pain syndrome 08/26/2017  . 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 Thyroid nodule 02/27/2017  . Fatty liver 02/27/2017  . Purpura ([place]) 01/29/2017  . Right hip pain 08/16/2016  . Lumbar herniated disc 02/03/2016  . Hypertriglyceridemia 12/09/2015  . Anemia 12/09/2015  . Falls 09/16/2015  . Barrett's esophagus 09/16/2015  . Chronic lumbar pain 09/16/2015  . Benign prostatic hyperplasia 09/16/2015  . Trochanteric bursitis of left hip 09/09/2015  . Headache 09/09/2015  . Difficulty in walking 06/01/2015  . Amnesia 06/01/2015  . HLD (hyperlipidemia) 02/23/2015  . DJD (degenerative joint disease) of knee 11/15/2014  . Hypertension   . Tremor, essential   . Primary localized osteoarthritis of right knee   . Anxiety and depression   . Absence of sensation 11/18/2013  . Tobacco abuse 11/18/2013  . Chronic diarrhea 11/12/2013  . Benign neoplasm of colon 08/25/2013  . 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 Type 2 diabetes mellitus ([place]) 10/06/2012    Immunization History  Administered Date(s) Administered  . Fluad Quad(high Dose 65+) 02/05/2020  . Hepatitis A, Adult 12/02/2018  . Influenza, High Dose Seasonal PF 01/29/2017, 03/19/2018, 01/10/2019  . Influenza,inj,Quad PF,6+ Mos 03/13/2016  .  Influenza-Unspecified 01/21/2015  . PFIZER(Purple Top)SARS-COV-2 Vaccination 06/01/2019, 06/22/2019  . Pneumococcal Conjugate-13 02/07/2015  . Pneumococcal Polysaccharide-23 01/18/2018  . Tdap 09/16/2015, 01/18/2018  . Zoster 09/16/2015  . Zoster Recombinat (Shingrix) 01/03/2018, 05/09/2018    Conditions to be addressed/monitored: HTN, HLD, DMII and Depression  Care Plan : Medication Management  Updates made  by De Hollingshead, RPH-CPP since 10/11/2020 12:00 AM    Problem: Diabetes, Depression     Long-Range Goal: Disease Progression Prevention   Start Date: 02/16/2020  This Visit's Progress: On track  Recent Progress: On track  Priority: Medium  Note:   Current Barriers:  . Unable to independently afford treatment regimen  Pharmacist Clinical Goal(s):  Marland Kitchen Over the next 30 days, patient will verbalize ability to afford treatment regimen through collaboration with PharmD and provider.  . Over the next 90 days, patient will maintain glycemic control as evidenced by A1c through collaboration with PharmD and provider.  Interventions: . 1:1 collaboration with Leone Haven, MD regarding development and update of comprehensive plan of care as evidenced by provider attestation and co-signature . Inter-disciplinary care team collaboration (see longitudinal plan of care) . Comprehensive medication review performed; medication list updated in electronic medical record  Diabetes: . Unontrolled; current treatment: Jardiance 25 mg daily, Ozempic 0.5 mg weekly (max tolerated dose d/t GI upset/weight loss at 1 mg weekly) o Hx metformin ER - significant diarrhea that resolved upon discontinuation . Receiving Ozempic through [employer] patient assistance.  . Patient requests refill be sent to [employer] for [employer]. Collaborate w/ PCP to complete refill form, fax in. . A1c collected today. PCP provided dietary counseling.   Hx tobacco use: Marland Kitchen Patient reports he quit smoking after  hospitalization, but has restarted about 2 months ago . Will continue to follow moving forward for patient interest in cessation  Hypertension: . Controlled per last clinic reading; current treatment: lisinopril/HCTZ 20/25 mg daily, metoprolol succinate 25 mg daily . Previously recommended to continue current regimen  Hyperlipidemia: . Controlled; current treatment: rosuvastatin 40 mg daily, fenofibrate 145 mg daily, Vascepa 2 g BID . Discussed [place] re-enrollment at our last call. Patient was to contact me with any questions or concerns.  . Continue current regimen at this time.  Depression/insomnia: . Improved at last visit, though notes more fatigue today to PCP. Current regimen: bupropion XL 300 mg daily, trazodone 100-150 mg QPM. Follows w/ Dr. Modesta Messing and LCSW . Previously recommended to continue current collaboration with psychiatry. Comprehensive labs drawn today to evaluate for treatable causes of fatigue.  Chronic Pain w/ recent ankle surgery: . Improved per patient report; hydrocodone/APAP 10/325 mg up to TID, gabapentin 1200 mg TID. Follows w/ Dr. Holley Raring . Denies concerns w/ constipation (though concurrent diarrhea) . Previously recommended to continue collaboration w/ Pain Management  Neurologic Conditions (pseudo dementia, tremor, sleep disorder) . Well managed per patient report; current regimen: memantine 10 mg BID, donepezil 10 mg daily; primidone 250 mg QAM, 150 mg QPM; divalproex 500 mg BID, quetiapine 150 mg QPM; Follows w/ Dr. Melrose Nakayama.  . Previously recommended to continue current regimen at this time along with collaboration w/ Neurology  Chronic Diarrhea/Colitis: . Controlled per patient report; current regimen: budesonide 6 mg daily, Lomotil 1 tab QAM, hyoscyamine PRN added at last visit; follows w/ [facility] . Previously recommended to continue current regimen at this time along with collaboration w/ GI.   OAB/BPH: . Improved; current treatment  regimen: Myrbetriq 50 mg daily, tamsulosin 0.4 mg daily; follows w/ Dr. Diamantina Providence . Reported Myrbetriq cost concerns. Unfortunately, Myrbetriq patient assistance program is only for uninsured patients. Will discuss moving forward   Patient Goals/Self-Care Activities . Over the next 90 days, patient will:  - take medications as prescribed check glucose at least 3 times daily using CGM, document, and provide at future appointments collaborate  with provider on medication access solutions engage in dietary modifications by reducing carbohydrate portion sizes Focus on tobacco cessation  Follow Up Plan: Telephone follow up appointment with care management team member scheduled for: ~8 weeks as previously scheduled      Medication Assistance: Ozempic obtained through [employer] medication assistance program.  Enrollment ends 05/20/21  Patient's preferred pharmacy is:  [place], [place] [address] [place] [address] Phone: [phone] Fax: [phone]  [id number] [name], [place] [address] [place] [address] Phone: [phone] Fax: [phone]  [facility] [address] [place] - [place] [address] Phone: [phone] Fax: [phone]   Follow Up:  Patient agrees to Care Plan and Follow-up.  Plan: Telephone follow up appointment with care management team member scheduled for:  ~ 8 weeks as previously scheduled  Catie Darnelle Maffucci, PharmD, [place], [place] Clinical Pharmacist [employer] at [employer] [phone]

## 2020-10-11 NOTE — Assessment & Plan Note (Signed)
It appears he is scheduled for a EGD in the near future.

## 2020-10-11 NOTE — Assessment & Plan Note (Signed)
Improved on Myrbetriq 50 mg once daily.  He will continue this.  We will send a message to our clinical pharmacist to see if there is any assistance options for this.  Blood pressure is stable on this.

## 2020-10-11 NOTE — Patient Instructions (Signed)
Visit Information  PATIENT GOALS: Goals Addressed              This Visit's Progress     Patient Stated   .  Medication Monitoring (pt-stated)        Patient Goals/Self-Care Activities . Over the next 90 days, patient will:  - take medications as prescribed collaborate with provider on medication access solutions       Patient verbalizes understanding of instructions provided today and agrees to view in [place].   Plan: Telephone follow up appointment with care management team member scheduled for:  ~ 8 weeks as previously scheduled  Catie Darnelle Maffucci, PharmD, [place], [place] Clinical Pharmacist [employer] at [employer] [phone]

## 2020-10-11 NOTE — Patient Instructions (Signed)
Nice to see you. We will get lab work today. Please work on decreasing your carbohydrate intake in the morning.

## 2020-10-13 ENCOUNTER — Ambulatory Visit: Payer: PPO | Admitting: Pharmacist

## 2020-10-13 DIAGNOSIS — E041 Nontoxic single thyroid nodule: Secondary | ICD-10-CM

## 2020-10-13 DIAGNOSIS — E1142 Type 2 diabetes mellitus with diabetic polyneuropathy: Secondary | ICD-10-CM

## 2020-10-13 DIAGNOSIS — N1831 Chronic kidney disease, stage 3a: Secondary | ICD-10-CM

## 2020-10-13 NOTE — Chronic Care Management (AMB) (Signed)
Chronic Care Management Pharmacy Note  10/13/2020 Name:  James Hood MRN:  [id number] DOB:  01-Oct-1940  Subjective: James Hood is an 80 y.o. year old male who is a primary patient of Caryl Bis, Angela Adam, MD.  The CCM team was consulted for assistance with disease management and care coordination needs.    Care coordination for medication access in response to provider referral for pharmacy case management and/or care coordination services.   Consent to Services:  The patient was given information about Chronic Care Management services, agreed to services, and gave verbal consent prior to initiation of services.  Please see initial visit note for detailed documentation.   Patient Care Team: Leone Haven, MD as PCP - General (Family Medicine) De Hollingshead, RPH-CPP as Pharmacist (Pharmacist)  Recent office visits: None since our last viist  Recent consult visits: None since our last visit  Hospital visits: None in previous 6 months  Objective:  Lab Results  Component Value Date   CREATININE 1.11 10/11/2020   CREATININE 1.47 09/08/2020   CREATININE 1.21 07/20/2020    Lab Results  Component Value Date   HGBA1C 7.1 (H) 09/08/2020   Last diabetic Eye exam:  Lab Results  Component Value Date/Time   HMDIABEYEEXA No Retinopathy 01/27/2020 03:37 PM    Last diabetic Foot exam: No results found for: HMDIABFOOTEX      Component Value Date/Time   CHOL 99 07/05/2020 1209   TRIG 226.0 (H) 07/05/2020 1209   HDL 43.20 07/05/2020 1209   CHOLHDL 2 07/05/2020 1209   VLDL 45.2 (H) 07/05/2020 1209   LDLCALC 57 09/25/2017 0924   LDLDIRECT 31.0 07/05/2020 1209    Hepatic Function Latest Ref Rng & Units 10/11/2020 06/08/2020 05/31/2020  Total Protein 6.0 - 8.3 g/dL 6.2 6.9 5.8(L)  Albumin 3.5 - 5.2 g/dL 4.1 3.3(L) 2.8(L)  AST 0 - 37 U/L 15 29 19   ALT 0 - 53 U/L 11 19 14   Alk Phosphatase 39 - 117 U/L 41 58 32(L)  Total Bilirubin 0.2 - 1.2 mg/dL 0.3 0.8 0.7   Bilirubin, Direct 0.0 - 0.3 mg/dL - - -    Lab Results  Component Value Date/Time   TSH 0.97 10/11/2020 10:08 AM   TSH 0.346 (L) 05/31/2020 12:34 AM   TSH 1.36 07/20/2019 09:02 AM    CBC Latest Ref Rng & Units 10/11/2020 06/08/2020 06/01/2020  WBC 4.0 - 10.5 K/uL 5.8 6.5 6.7  Hemoglobin 13.0 - 17.0 g/dL 12.6(L) 11.8(L) 10.2(L)  Hematocrit 39.0 - 52.0 % 37.5(L) 35.7(L) 31.0(L)  Platelets 150.0 - 400.0 K/uL 249.0 375 203    Lab Results  Component Value Date/Time   VD25OH 24.57 (L) 10/11/2020 10:08 AM   VD25OH 42.83 04/07/2019 08:54 AM    Clinical ASCVD: No    Social History   Tobacco Use  Smoking Status Current Every Day Smoker  . Packs/day: 0.50  . Years: 60.00  . Pack years: 30.00  . Types: Cigarettes  Smokeless Tobacco Never Used   BP Readings from Last 3 Encounters:  10/11/20 120/80  08/24/20 117/73  08/22/20 107/70   Pulse Readings from Last 3 Encounters:  10/11/20 64  08/24/20 76  08/22/20 81   Wt Readings from Last 3 Encounters:  10/11/20 165 lb 9.6 oz (75.1 kg)  08/24/20 165 lb (74.8 kg)  08/22/20 169 lb 12.8 oz (77 kg)    Assessment: Review of patient past medical history, allergies, medications, health status, including review of consultants reports, laboratory and  other test data, was performed as part of comprehensive evaluation and provision of chronic care management services.   SDOH:  (Social Determinants of Health) assessments and interventions performed:    CCM Care Plan  No Known Allergies  Medications Reviewed Today    Reviewed by Gordy Councilman, CMA (Certified Medical Assistant) on 10/11/20 at 0930  Med List Status: <None>  Medication Order Taking? Sig Documenting Provider Last Dose Status Informant  Alpha-Lipoic Acid 600 MG CAPS [id number] Yes Take 1 capsule (600 mg total) by mouth daily. [name], MD Taking Active   aspirin EC 81 MG tablet [id number] Yes Take 81 mg by mouth daily. [provider] Taking Active             Med Note Vanessa [place], SUSAN   Tue Apr 15, 2018  8:47 AM)    budesonide (ENTOCORT EC) 3 MG 24 hr capsule [id number] Yes Take 9 mg by mouth daily. [provider] Taking Active   buPROPion (WELLBUTRIN XL) 300 MG 24 hr tablet [id number] Yes Take 1 tablet (300 mg total) by mouth daily. Norman Clay, MD Taking Active   Continuous Blood Gluc Sensor (FREESTYLE LIBRE 14 DAY SENSOR) MISC [id number] Yes APPLY ONE SENSOR EVERY 14 DAYS TO MONITOR BLOOD GLUCOSE LEVELS. REMOVE OLD SENSOR BEFORE APPLYING NEW SENSOR Leone Haven, MD Taking Active   diphenoxylate-atropine (LOMOTIL) 2.5-0.025 MG tablet [id number] Yes Take by mouth. [provider] Taking Active   divalproex (DEPAKOTE ER) 500 MG 24 hr tablet [id number] Yes Take 1 tablet by mouth 2 (two) times daily. [provider] Taking Active   donepezil (ARICEPT) 10 MG tablet [id number] Yes Take 10 mg by mouth at bedtime. [provider] Taking Active Self  fenofibrate (TRICOR) 145 MG tablet [id number] Yes TAKE ONE TABLET BY MOUTH DAILY Leone Haven, MD Taking Active   finasteride (PROSCAR) 5 MG tablet [id number] Yes TAKE ONE TABLET BY MOUTH DAILY. Leone Haven, MD Taking Active   gabapentin (NEURONTIN) 600 MG tablet [id number] Yes TAKE TWO TABLETS BY MOUTH THREE TIMES A DAY Leone Haven, MD Taking Active   HYDROcodone-acetaminophen [facility]) 10-325 MG tablet [id number] Yes Take 1 tablet by mouth every 4 (four) hours as needed for severe pain. Must last 30 days. [name], MD Taking Active   Hyoscyamine Sulfate SL 0.125 MG SUBL [id number] Yes Place under the tongue. [provider] Taking Active   icosapent Ethyl (VASCEPA) 1 g capsule [id number] Yes Take 2 capsules (2 g total) by mouth 2 (two) times daily. Leone Haven, MD Taking Active   JARDIANCE 25 MG TABS tablet [id number] Yes TAKE ONE TABLET BY MOUTH DAILY Leone Haven, MD Taking Active   lisinopril-hydrochlorothiazide (ZESTORETIC) 20-25 MG  tablet [id number] Yes TAKE ONE TABLET BY MOUTH TWICE A DAY Leone Haven, MD Taking Active   memantine (NAMENDA) 10 MG tablet [id number] Yes TAKE ONE TABLET BY MOUTH TWICE A DAY [provider] Taking Active   metoprolol succinate (TOPROL-XL) 25 MG 24 hr tablet [id number] Yes Take 25 mg by mouth daily. [provider] Taking Active   mirabegron ER (MYRBETRIQ) 50 MG TB24 tablet [id number] Yes Take 1 tablet (50 mg total) by mouth daily. Billey Co, MD Taking Active   Multiple Vitamins-Minerals (CENTRUM SILVER PO) [id number] Yes Take 1 tablet by mouth daily. [provider] Taking Active   omeprazole (PRILOSEC) 20 MG capsule [id number] Yes Take 20 mg by mouth 2 (two) times daily before a meal.  [provider]  Taking Active Self           Med Note (CHAMBERS, KIMBERLY   Thu Aug 09, 2016 12:05 PM)    primidone (MYSOLINE) 50 MG tablet [id number] Yes Take 150-250 mg by mouth 2 (two) times daily. Take 250 mg by mouth in the morning & take 150 mg at night. [provider] Taking Active Self  QUEtiapine (SEROQUEL) 100 MG tablet [id number] Yes Take 150 mg by mouth at bedtime. [provider] Taking Active Pharmacy Records  rosuvastatin (CRESTOR) 40 MG tablet [id number] Yes TAKE ONE TABLET BY MOUTH DAILY Leone Haven, MD Taking Active   Semaglutide,0.25 or 0.5MG/DOS, (OZEMPIC, 0.25 OR 0.5 MG/DOSE,) 2 MG/1.5ML SOPN [id number] Yes Inject 0.5 mg into the skin once a week. Leone Haven, MD Taking Active Pharmacy Records  simvastatin (ZOCOR) 40 MG tablet [id number] Yes Take by mouth. [provider] Taking Active   tamsulosin (FLOMAX) 0.4 MG CAPS capsule [id number] Yes TAKE ONE CAPSULE BY MOUTH DAILY AFTER Shawna Clamp, Randell Patient, MD Taking Active   traZODone (DESYREL) 100 MG tablet [id number] Yes 100-150 mg at night as needed for sleep Norman Clay, MD Taking Active   Med List Note Dewayne Shorter, RN 07/27/20 0915): [id number] UDS 10-06-2019           Patient Active Problem List   Diagnosis Date Noted  . Fatigue 10/11/2020  . OAB (overactive bladder) 08/08/2020  . AKI (acute kidney injury) ([place]) 08/08/2020  . Leg cramps 08/08/2020  . Prolonged Q-T interval on ECG 06/08/2020  . 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 Right leg pain 02/05/2020  . Impingement of left ankle joint   . Sleeping difficulty 08/24/2019  . Hiccups 08/24/2019  . Chronic pain of left ankle 02/27/2019  . Vitamin D deficiency 02/18/2019  . Left shoulder pain 02/02/2019  . Neuropathy 09/24/2018  . Lumbosacral radiculopathy 01/14/2018  . 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 Facet arthritis of lumbar region 01/14/2018  . Chronic pain syndrome 08/26/2017  . 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 Thyroid nodule 02/27/2017  . Fatty liver 02/27/2017  . Purpura ([place]) 01/29/2017  . Right hip pain 08/16/2016  . Lumbar herniated disc 02/03/2016  . Hypertriglyceridemia 12/09/2015  . Anemia 12/09/2015  . Falls 09/16/2015  . Barrett's esophagus 09/16/2015  . Chronic lumbar pain 09/16/2015  . Benign prostatic hyperplasia 09/16/2015  . Trochanteric bursitis of left hip 09/09/2015  . Headache 09/09/2015  . Difficulty in walking 06/01/2015  . Amnesia 06/01/2015  . HLD (hyperlipidemia) 02/23/2015  . DJD (degenerative joint disease) of knee 11/15/2014  . Hypertension   . Tremor, essential   . Primary localized osteoarthritis of right knee   . Anxiety and depression   . Absence of sensation 11/18/2013  . Tobacco abuse 11/18/2013  . Chronic diarrhea 11/12/2013  . Benign neoplasm of colon 08/25/2013  . 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 Type 2 diabetes mellitus ([place]) 10/06/2012    Immunization History  Administered Date(s) Administered  . Fluad Quad(high Dose 65+) 02/05/2020  . Hepatitis A, Adult 12/02/2018  . Influenza, High Dose Seasonal PF 01/29/2017, 03/19/2018, 01/10/2019  . Influenza,inj,Quad PF,6+ Mos 03/13/2016  .  Influenza-Unspecified 01/21/2015  . PFIZER(Purple Top)SARS-COV-2 Vaccination 06/01/2019, 06/22/2019  . Pneumococcal Conjugate-13 02/07/2015  . Pneumococcal Polysaccharide-23 01/18/2018  . Tdap 09/16/2015, 01/18/2018  . Zoster Recombinat (Shingrix) 01/03/2018, 05/09/2018  . Zoster, Live 09/16/2015    Conditions to be addressed/monitored: HTN, HLD and DMII  Care Plan : Medication Management  Updates made by Darnelle Maffucci,  Arville Lime, RPH-CPP since 10/13/2020 12:00 AM    Problem: Diabetes, Depression     Long-Range Goal: Disease Progression Prevention   Start Date: 02/16/2020  This Visit's Progress: On track  Recent Progress: On track  Priority: Medium  Note:   Current Barriers:  . Unable to independently afford treatment regimen  Pharmacist Clinical Goal(s):  Marland Kitchen Over the next 30 days, patient will verbalize ability to afford treatment regimen through collaboration with PharmD and provider.  . Over the next 90 days, patient will maintain glycemic control as evidenced by A1c through collaboration with PharmD and provider.  Interventions: . 1:1 collaboration with Leone Haven, MD regarding development and update of comprehensive plan of care as evidenced by provider attestation and co-signature . Inter-disciplinary care team collaboration (see longitudinal plan of care) . Comprehensive medication review performed; medication list updated in electronic medical record  Diabetes: . Unontrolled; current treatment: Jardiance 25 mg daily, Ozempic 0.5 mg weekly (max tolerated dose d/t GI upset/weight loss at 1 mg weekly) o Hx metformin ER - significant diarrhea that resolved upon discontinuation . [facility] to f/u on refill fax. They will process and ship in the next 10-14 business days.   Hx tobacco use: Marland Kitchen Patient reports he quit smoking after hospitalization, but has restarted about 2 months ago . Will continue to follow moving forward for patient interest in  cessation  Hypertension: . Controlled per last clinic reading; current treatment: lisinopril/HCTZ 20/25 mg daily, metoprolol succinate 25 mg daily . Previously recommended to continue current regimen  Hyperlipidemia: . Controlled; current treatment: rosuvastatin 40 mg daily, fenofibrate 145 mg daily, Vascepa 2 g BID . Discussed [place] re-enrollment at our last call. Patient was to contact me with any questions or concerns.  . Previously recommended to continue current regimen at this time.  Depression/insomnia: . Improved at last visit, though notes more fatigue today to PCP. Current regimen: bupropion XL 300 mg daily, trazodone 100-150 mg QPM. Follows w/ Dr. Modesta Messing and LCSW . Previously recommended to continue current collaboration with psychiatry. Comprehensive labs drawn today to evaluate for treatable causes of fatigue.  Chronic Pain w/ recent ankle surgery: . Improved per patient report; hydrocodone/APAP 10/325 mg up to TID, gabapentin 1200 mg TID. Follows w/ Dr. Holley Raring . Denies concerns w/ constipation (though concurrent diarrhea) . Previously recommended to continue collaboration w/ Pain Management  Neurologic Conditions (pseudo dementia, tremor, sleep disorder) . Well managed per patient report; current regimen: memantine 10 mg BID, donepezil 10 mg daily; primidone 250 mg QAM, 150 mg QPM; divalproex 500 mg BID, quetiapine 150 mg QPM; Follows w/ Dr. Melrose Nakayama.  . Previously recommended to continue current regimen at this time along with collaboration w/ Neurology  Chronic Diarrhea/Colitis: . Controlled per patient report; current regimen: budesonide 6 mg daily, Lomotil 1 tab QAM, hyoscyamine PRN added at last visit; follows w/ [facility] . Previously recommended to continue current regimen at this time along with collaboration w/ GI.   OAB/BPH: . Improved; current treatment regimen: Myrbetriq 50 mg daily, tamsulosin 0.4 mg daily; follows w/ Dr. Diamantina Providence . Reported Myrbetriq  cost concerns. Unfortunately, Myrbetriq patient assistance program is only for uninsured patients. Will discuss moving forward   Patient Goals/Self-Care Activities . Over the next 90 days, patient will:  - take medications as prescribed check glucose at least 3 times daily using CGM, document, and provide at future appointments collaborate with provider on medication access solutions engage in dietary modifications by reducing carbohydrate portion sizes Focus  on tobacco cessation  Follow Up Plan: Telephone follow up appointment with care management team member scheduled for: ~8 weeks as previously scheduled      Medication Assistance: Ozempic obtained through [employer] medication assistance program.  Enrollment ends 05/20/21  Patient's preferred pharmacy is:  [facility], [place] [address] [place] [address] Phone: [phone] Fax: [phone]  [id number] [name], [place] [address] [place] [address] Phone: [phone] Fax: [phone]  [facility] [address] [place] [address] Phone: [phone] Fax: [phone]    Follow Up:  Patient agrees to Care Plan and Follow-up.  Plan: Telephone follow up appointment with care management team member scheduled for:  ~ 8 weeks as previously scheduled  Catie Darnelle Maffucci, PharmD, [place], [place] Clinical Pharmacist [employer] [facility] [employer] [phone]

## 2020-10-13 NOTE — Patient Instructions (Signed)
Visit Information  PATIENT GOALS: Goals Addressed              This Visit's Progress     Patient Stated   .  Medication Monitoring (pt-stated)        Patient Goals/Self-Care Activities . Over the next 90 days, patient will:  - take medications as prescribed collaborate with provider on medication access solutions        Patient verbalizes understanding of instructions provided today and agrees to view in [place].   Plan: Telephone follow up appointment with care management team member scheduled for:  ~ 8 weeks as previously scheduled  Catie Darnelle Maffucci, PharmD, [place], [place] Clinical Pharmacist [employer] [facility] [employer] [phone]

## 2020-10-16 ENCOUNTER — Other Ambulatory Visit: Payer: Self-pay | Admitting: Family Medicine

## 2020-10-16 DIAGNOSIS — E782 Mixed hyperlipidemia: Secondary | ICD-10-CM

## 2020-10-20 ENCOUNTER — Other Ambulatory Visit: Payer: Self-pay | Admitting: Family Medicine

## 2020-10-20 DIAGNOSIS — E559 Vitamin D deficiency, unspecified: Secondary | ICD-10-CM

## 2020-10-21 ENCOUNTER — Ambulatory Visit: Payer: PPO | Admitting: Pharmacist

## 2020-10-21 DIAGNOSIS — E1142 Type 2 diabetes mellitus with diabetic polyneuropathy: Secondary | ICD-10-CM

## 2020-10-21 DIAGNOSIS — G8929 Other chronic pain: Secondary | ICD-10-CM

## 2020-10-21 DIAGNOSIS — N1831 Chronic kidney disease, stage 3a: Secondary | ICD-10-CM

## 2020-10-21 DIAGNOSIS — I10 Essential (primary) hypertension: Secondary | ICD-10-CM

## 2020-10-21 DIAGNOSIS — F419 Anxiety disorder, unspecified: Secondary | ICD-10-CM

## 2020-10-21 DIAGNOSIS — F32A Depression, unspecified: Secondary | ICD-10-CM

## 2020-10-21 DIAGNOSIS — M545 Low back pain, unspecified: Secondary | ICD-10-CM

## 2020-10-21 NOTE — Chronic Care Management (AMB) (Signed)
Chronic Care Management Pharmacy Note  10/21/2020 Name:  James Hood MRN:  [id number] DOB:  03-Mar-1941  Subjective: James Hood is an 80 y.o. year old male who is a primary patient of Caryl Bis, Angela Adam, MD.  The CCM team was consulted for assistance with disease management and care coordination needs.    Care coordination for medication access in response to provider referral for pharmacy case management and/or care coordination services.   Consent to Services:  The patient was given information about Chronic Care Management services, agreed to services, and gave verbal consent prior to initiation of services.  Please see initial visit note for detailed documentation.   Patient Care Team: Leone Haven, MD as PCP - General (Family Medicine) De Hollingshead, RPH-CPP as Pharmacist (Pharmacist)    Objective:  Lab Results  Component Value Date   CREATININE 1.11 10/11/2020   CREATININE 1.47 09/08/2020   CREATININE 1.21 07/20/2020    Lab Results  Component Value Date   HGBA1C 7.1 (H) 09/08/2020   Last diabetic Eye exam:  Lab Results  Component Value Date/Time   HMDIABEYEEXA No Retinopathy 01/27/2020 03:37 PM    Last diabetic Foot exam: No results found for: HMDIABFOOTEX      Component Value Date/Time   CHOL 99 07/05/2020 1209   TRIG 226.0 (H) 07/05/2020 1209   HDL 43.20 07/05/2020 1209   CHOLHDL 2 07/05/2020 1209   VLDL 45.2 (H) 07/05/2020 1209   LDLCALC 57 09/25/2017 0924   LDLDIRECT 31.0 07/05/2020 1209    Hepatic Function Latest Ref Rng & Units 10/11/2020 06/08/2020 05/31/2020  Total Protein 6.0 - 8.3 g/dL 6.2 6.9 5.8(L)  Albumin 3.5 - 5.2 g/dL 4.1 3.3(L) 2.8(L)  AST 0 - 37 U/L 15 29 19   ALT 0 - 53 U/L 11 19 14   Alk Phosphatase 39 - 117 U/L 41 58 32(L)  Total Bilirubin 0.2 - 1.2 mg/dL 0.3 0.8 0.7  Bilirubin, Direct 0.0 - 0.3 mg/dL - - -    Lab Results  Component Value Date/Time   TSH 0.97 10/11/2020 10:08 AM   TSH 0.346 (L) 05/31/2020  12:34 AM   TSH 1.36 07/20/2019 09:02 AM    CBC Latest Ref Rng & Units 10/11/2020 06/08/2020 06/01/2020  WBC 4.0 - 10.5 K/uL 5.8 6.5 6.7  Hemoglobin 13.0 - 17.0 g/dL 12.6(L) 11.8(L) 10.2(L)  Hematocrit 39.0 - 52.0 % 37.5(L) 35.7(L) 31.0(L)  Platelets 150.0 - 400.0 K/uL 249.0 375 203    Lab Results  Component Value Date/Time   VD25OH 24.57 (L) 10/11/2020 10:08 AM   VD25OH 42.83 04/07/2019 08:54 AM    Clinical ASCVD: Yes  The ASCVD Risk score Mikey Bussing DC Jr., et al., 2013) failed to calculate for the following reasons:   The 2013 ASCVD risk score is only valid for ages 52 to 26     Social History   Tobacco Use  Smoking Status Current Every Day Smoker  . Packs/day: 0.50  . Years: 60.00  . Pack years: 30.00  . Types: Cigarettes  Smokeless Tobacco Never Used   BP Readings from Last 3 Encounters:  10/11/20 120/80  08/24/20 117/73  08/22/20 107/70   Pulse Readings from Last 3 Encounters:  10/11/20 64  08/24/20 76  08/22/20 81   Wt Readings from Last 3 Encounters:  10/11/20 165 lb 9.6 oz (75.1 kg)  08/24/20 165 lb (74.8 kg)  08/22/20 169 lb 12.8 oz (77 kg)    Assessment: Review of patient past medical history, allergies,  medications, health status, including review of consultants reports, laboratory and other test data, was performed as part of comprehensive evaluation and provision of chronic care management services.   SDOH:  (Social Determinants of Health) assessments and interventions performed:  SDOH Interventions   Flowsheet Row Most Recent Value  SDOH Interventions   Financial Strain Interventions Other (Comment)  [medication assistance]      CCM Care Plan  No Known Allergies  Medications Reviewed Today    Reviewed by Gordy Councilman, CMA (Certified Medical Assistant) on 10/11/20 at [place] List Status: <None>  Medication Order Taking? Sig Documenting Provider Last Dose Status Informant  Alpha-Lipoic Acid 600 MG CAPS [id number] Yes Take 1 capsule (600 mg  total) by mouth daily. [name], MD Taking Active   aspirin EC 81 MG tablet [id number] Yes Take 81 mg by mouth daily. [provider] Taking Active            Med Note Vanessa Kangley, SUSAN   Tue Apr 15, 2018  8:47 AM)    budesonide (ENTOCORT EC) 3 MG 24 hr capsule [id number] Yes Take 9 mg by mouth daily. [provider] Taking Active   buPROPion (WELLBUTRIN XL) 300 MG 24 hr tablet [id number] Yes Take 1 tablet (300 mg total) by mouth daily. Norman Clay, MD Taking Active   Continuous Blood Gluc Sensor (FREESTYLE LIBRE 14 DAY SENSOR) MISC [id number] Yes APPLY ONE SENSOR EVERY 14 DAYS TO MONITOR BLOOD GLUCOSE LEVELS. REMOVE OLD SENSOR BEFORE APPLYING NEW SENSOR Leone Haven, MD Taking Active   diphenoxylate-atropine (LOMOTIL) 2.5-0.025 MG tablet [id number] Yes Take by mouth. [provider] Taking Active   divalproex (DEPAKOTE ER) 500 MG 24 hr tablet [id number] Yes Take 1 tablet by mouth 2 (two) times daily. [provider] Taking Active   donepezil (ARICEPT) 10 MG tablet [id number] Yes Take 10 mg by mouth at bedtime. [provider] Taking Active Self  fenofibrate (TRICOR) 145 MG tablet [id number] Yes TAKE ONE TABLET BY MOUTH DAILY Leone Haven, MD Taking Active   finasteride (PROSCAR) 5 MG tablet [id number] Yes TAKE ONE TABLET BY MOUTH DAILY. Leone Haven, MD Taking Active   gabapentin (NEURONTIN) 600 MG tablet [id number] Yes TAKE TWO TABLETS BY MOUTH THREE TIMES A DAY Leone Haven, MD Taking Active   HYDROcodone-acetaminophen [facility]) 10-325 MG tablet [id number] Yes Take 1 tablet by mouth every 4 (four) hours as needed for severe pain. Must last 30 days. [name], MD Taking Active   Hyoscyamine Sulfate SL 0.125 MG SUBL [id number] Yes Place under the tongue. [provider] Taking Active   icosapent Ethyl (VASCEPA) 1 g capsule [id number] Yes Take 2 capsules (2 g total) by mouth 2 (two) times daily. Leone Haven, MD Taking  Active   JARDIANCE 25 MG TABS tablet [id number] Yes TAKE ONE TABLET BY MOUTH DAILY Leone Haven, MD Taking Active   lisinopril-hydrochlorothiazide (ZESTORETIC) 20-25 MG tablet [id number] Yes TAKE ONE TABLET BY MOUTH TWICE A DAY Leone Haven, MD Taking Active   memantine (NAMENDA) 10 MG tablet [id number] Yes TAKE ONE TABLET BY MOUTH TWICE A DAY [provider] Taking Active   metoprolol succinate (TOPROL-XL) 25 MG 24 hr tablet [id number] Yes Take 25 mg by mouth daily. [provider] Taking Active   mirabegron ER (MYRBETRIQ) 50 MG TB24 tablet [id number] Yes Take 1 tablet (50 mg total) by mouth daily. Billey Co, MD Taking Active   Multiple Vitamins-Minerals (CENTRUM SILVER PO) [id number] Yes Take  1 tablet by mouth daily. [provider] Taking Active   omeprazole (PRILOSEC) 20 MG capsule [id number] Yes Take 20 mg by mouth 2 (two) times daily before a meal.  [provider] Taking Active Self           Med Note Josiah Lobo, KIMBERLY   Thu Aug 09, 2016 12:05 PM)    primidone (MYSOLINE) 50 MG tablet [id number] Yes Take 150-250 mg by mouth 2 (two) times daily. Take 250 mg by mouth in the morning & take 150 mg at night. [provider] Taking Active Self  QUEtiapine (SEROQUEL) 100 MG tablet [id number] Yes Take 150 mg by mouth at bedtime. [provider] Taking Active Pharmacy Records  rosuvastatin (CRESTOR) 40 MG tablet [id number] Yes TAKE ONE TABLET BY MOUTH DAILY Leone Haven, MD Taking Active   Semaglutide,0.25 or 0.5MG/DOS, (OZEMPIC, 0.25 OR 0.5 MG/DOSE,) 2 MG/1.5ML SOPN [id number] Yes Inject 0.5 mg into the skin once a week. Leone Haven, MD Taking Active Pharmacy Records  simvastatin (ZOCOR) 40 MG tablet [id number] Yes Take by mouth. [provider] Taking Active   tamsulosin (FLOMAX) 0.4 MG CAPS capsule [id number] Yes TAKE ONE CAPSULE BY MOUTH DAILY AFTER Shawna Clamp, Randell Patient, MD Taking Active   traZODone (DESYREL) 100  MG tablet [id number] Yes 100-150 mg at night as needed for sleep Norman Clay, MD Taking Active   Med List Note Dewayne Shorter, RN 07/27/20 0915): [id number] UDS 10-06-2019          Patient Active Problem List   Diagnosis Date Noted  . Fatigue 10/11/2020  . OAB (overactive bladder) 08/08/2020  . AKI (acute kidney injury) ([place]) 08/08/2020  . Leg cramps 08/08/2020  . Prolonged Q-T interval on ECG 06/08/2020  . 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 Right leg pain 02/05/2020  . Impingement of left ankle joint   . Sleeping difficulty 08/24/2019  . Hiccups 08/24/2019  . Chronic pain of left ankle 02/27/2019  . Vitamin D deficiency 02/18/2019  . Left shoulder pain 02/02/2019  . Neuropathy 09/24/2018  . Lumbosacral radiculopathy 01/14/2018  . 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 Facet arthritis of lumbar region 01/14/2018  . Chronic pain syndrome 08/26/2017  . 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 Thyroid nodule 02/27/2017  . Fatty liver 02/27/2017  . Purpura (Colon) 01/29/2017  . Right hip pain 08/16/2016  . Lumbar herniated disc 02/03/2016  . Hypertriglyceridemia 12/09/2015  . Anemia 12/09/2015  . Falls 09/16/2015  . Barrett's esophagus 09/16/2015  . Chronic lumbar pain 09/16/2015  . Benign prostatic hyperplasia 09/16/2015  . Trochanteric bursitis of left hip 09/09/2015  . Headache 09/09/2015  . Difficulty in walking 06/01/2015  . Amnesia 06/01/2015  . HLD (hyperlipidemia) 02/23/2015  . DJD (degenerative joint disease) of knee 11/15/2014  . Hypertension   . Tremor, essential   . Primary localized osteoarthritis of right knee   . Anxiety and depression   . Absence of sensation 11/18/2013  . Tobacco abuse 11/18/2013  . Chronic diarrhea 11/12/2013  . Benign neoplasm of colon 08/25/2013  . 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 Type 2 diabetes mellitus ([place]) 10/06/2012    Immunization History  Administered Date(s) Administered  . Fluad Quad(high Dose 65+)  02/05/2020  . Hepatitis A, Adult 12/02/2018  . Influenza, High Dose Seasonal PF 01/29/2017, 03/19/2018, 01/10/2019  . Influenza,inj,Quad PF,6+ Mos 03/13/2016  . Influenza-Unspecified 01/21/2015  . PFIZER(Purple Top)SARS-COV-2 Vaccination 06/01/2019, 06/22/2019  . Pneumococcal Conjugate-13 02/07/2015  . Pneumococcal Polysaccharide-23 01/18/2018  . Tdap 09/16/2015,  01/18/2018  . Zoster Recombinat (Shingrix) 01/03/2018, 05/09/2018  . Zoster, Live 09/16/2015    Conditions to be addressed/monitored: CAD, HTN, HLD and DMII  Care Plan : Medication Management  Updates made by De Hollingshead, RPH-CPP since 10/21/2020 12:00 AM    Problem: Diabetes, Depression     Long-Range Goal: Disease Progression Prevention   Start Date: 02/16/2020  Recent Progress: On track  Priority: Medium  Note:   Current Barriers:  . Unable to independently afford treatment regimen  Pharmacist Clinical Goal(s):  Marland Kitchen Over the next 30 days, patient will verbalize ability to afford treatment regimen through collaboration with PharmD and provider.  . Over the next 90 days, patient will maintain glycemic control as evidenced by A1c through collaboration with PharmD and provider.  Interventions: . 1:1 collaboration with Leone Haven, MD regarding development and update of comprehensive plan of care as evidenced by provider attestation and co-signature . Inter-disciplinary care team collaboration (see longitudinal plan of care) . Comprehensive medication review performed; medication list updated in electronic medical record  Diabetes: . Unontrolled; current treatment: Jardiance 25 mg daily, Ozempic 0.5 mg weekly (max tolerated dose d/t GI upset/weight loss at 1 mg weekly) o Hx metformin ER - significant diarrhea that resolved upon discontinuation . [place] to f/u on refill fax. They processed 5/30 and it should arrive to the office within 10-14 business days.   Hx tobacco use: Marland Kitchen Patient reports  he quit smoking after hospitalization, but has restarted about 2 months ago . Will continue to follow moving forward for patient interest in cessation  Hypertension: . Controlled per last clinic reading; current treatment: lisinopril/HCTZ 20/25 mg daily, metoprolol succinate 25 mg daily . Previously recommended to continue current regimen  Hyperlipidemia: . Controlled; current treatment: rosuvastatin 40 mg daily, fenofibrate 145 mg daily, Vascepa 2 g BID . Discussed [place] re-enrollment at our last call. Patient was to contact me with any questions or concerns.  . Previously recommended to continue current regimen at this time.  Depression/insomnia: . Improved at last visit, though notes more fatigue today to PCP. Current regimen: bupropion XL 300 mg daily, trazodone 100-150 mg QPM. Follows w/ Dr. Modesta Messing and LCSW . Previously recommended to continue current collaboration with psychiatry. Comprehensive labs drawn today to evaluate for treatable causes of fatigue.  Chronic Pain w/ recent ankle surgery: . Improved per patient report; hydrocodone/APAP 10/325 mg up to TID, gabapentin 1200 mg TID. Follows w/ Dr. Holley Raring . Denies concerns w/ constipation (though concurrent diarrhea) . Previously recommended to continue collaboration w/ Pain Management  Neurologic Conditions (pseudo dementia, tremor, sleep disorder) . Well managed per patient report; current regimen: memantine 10 mg BID, donepezil 10 mg daily; primidone 250 mg QAM, 150 mg QPM; divalproex 500 mg BID, quetiapine 150 mg QPM; Follows w/ Dr. Melrose Nakayama.  . Previously recommended to continue current regimen at this time along with collaboration w/ Neurology  Chronic Diarrhea/Colitis: . Controlled per patient report; current regimen: budesonide 6 mg daily, Lomotil 1 tab QAM, hyoscyamine PRN added at last visit; follows w/ [facility] . Previously recommended to continue current regimen at this time along with collaboration w/ GI.    OAB/BPH: . Improved; current treatment regimen: Myrbetriq 50 mg daily, tamsulosin 0.4 mg daily; follows w/ Dr. Diamantina Providence . Reported Myrbetriq cost concerns. Unfortunately, Myrbetriq patient assistance program is only for uninsured patients. Will discuss moving forward   Patient Goals/Self-Care Activities . Over the next 90 days, patient will:  - take medications as  prescribed check glucose at least 3 times daily using CGM, document, and provide at future appointments collaborate with provider on medication access solutions engage in dietary modifications by reducing carbohydrate portion sizes Focus on tobacco cessation  Follow Up Plan: Telephone follow up appointment with care management team member scheduled for: ~8 weeks as previously scheduled      Medication Assistance: Ozempicobtained throughNovo Nordiskmedication assistance program. Enrollment ends 05/20/21  Patient's preferred pharmacy is:  [facility] [place] [address] [place] [address] Phone: [phone] Fax: [phone]  [id number] [name], [place] [address] [place] [address] Phone: [phone] Fax: [phone]  [facility] [address], [place] - [place] [address] Phone: [phone] Fax: [phone]  Follow Up:  Patient agrees to Care Plan and Follow-up.  Plan: Telephone follow up appointment with care management team member scheduled for:  ~ 8 weeks as previously scheduled  Catie Darnelle Maffucci, PharmD, [place], [place] Clinical Pharmacist [employer] [facility] [employer] [phone]

## 2020-10-21 NOTE — Patient Instructions (Signed)
Visit Information  PATIENT GOALS: Goals Addressed              This Visit's Progress     Patient Stated   .  Medication Monitoring (pt-stated)        Patient Goals/Self-Care Activities . Over the next 90 days, patient will:  - take medications as prescribed collaborate with provider on medication access solutions       Patient verbalizes understanding of instructions provided today and agrees to view in [place].   Plan: Telephone follow up appointment with care management team member scheduled for:  ~ 8 weeks as previously scheduled  Catie Darnelle Maffucci, PharmD, [place], [place] Clinical Pharmacist [employer] at [employer] [phone]

## 2020-10-25 ENCOUNTER — Telehealth: Payer: Self-pay | Admitting: Pharmacist

## 2020-10-25 ENCOUNTER — Encounter: Payer: PPO | Admitting: Student in an Organized Health Care Education/Training Program

## 2020-10-25 NOTE — Telephone Encounter (Signed)
  Chronic Care Management   Note  10/25/2020 Name: James Hood MRN: [id number] DOB: 1940/06/07   Received voicemail from patient that his Jardiance copay increased to $139/30 days and he was wondering if there is patient assistance. We previously discussed PAP for Jardiance and he was over income. We can see if he meets [place] requirements for [place].   Left voicemail for him to return my call at his convenience.

## 2020-10-26 ENCOUNTER — Ambulatory Visit (INDEPENDENT_AMBULATORY_CARE_PROVIDER_SITE_OTHER): Payer: PPO | Admitting: Pharmacist

## 2020-10-26 DIAGNOSIS — F419 Anxiety disorder, unspecified: Secondary | ICD-10-CM

## 2020-10-26 DIAGNOSIS — E782 Mixed hyperlipidemia: Secondary | ICD-10-CM | POA: Diagnosis not present

## 2020-10-26 DIAGNOSIS — N1831 Chronic kidney disease, stage 3a: Secondary | ICD-10-CM

## 2020-10-26 DIAGNOSIS — E1142 Type 2 diabetes mellitus with diabetic polyneuropathy: Secondary | ICD-10-CM | POA: Diagnosis not present

## 2020-10-26 DIAGNOSIS — F32A Depression, unspecified: Secondary | ICD-10-CM

## 2020-10-26 DIAGNOSIS — I10 Essential (primary) hypertension: Secondary | ICD-10-CM | POA: Diagnosis not present

## 2020-10-26 DIAGNOSIS — N3281 Overactive bladder: Secondary | ICD-10-CM

## 2020-10-26 NOTE — Chronic Care Management (AMB) (Signed)
Chronic Care Management Pharmacy Note  10/26/2020 Name:  James Hood MRN:  [id number] DOB:  [birth year]   Subjective: James Hood is an 80 y.o. year old male who is a primary patient of Caryl Bis, Angela Adam, MD.  The CCM team was consulted for assistance with disease management and care coordination needs.    Engaged with patient by telephone for reponse to call regarding medication access in response to provider referral for pharmacy case management and/or care coordination services.   Consent to Services:  The patient was given information about Chronic Care Management services, agreed to services, and gave verbal consent prior to initiation of services.  Please see initial visit note for detailed documentation.   Patient Care Team: Leone Haven, MD as PCP - General (Family Medicine) De Hollingshead, RPH-CPP as Pharmacist (Pharmacist)  Recent office visits: None since our last call  Recent consult visits: None since our last cal  Hospital visits: None in previous 6 months  Objective:  Lab Results  Component Value Date   CREATININE 1.11 10/11/2020   CREATININE 1.47 09/08/2020   CREATININE 1.21 07/20/2020    Lab Results  Component Value Date   HGBA1C 7.1 (H) 09/08/2020   Last diabetic Eye exam:  Lab Results  Component Value Date/Time   HMDIABEYEEXA No Retinopathy 01/27/2020 03:37 PM    Last diabetic Foot exam: No results found for: HMDIABFOOTEX      Component Value Date/Time   CHOL 99 07/05/2020 1209   TRIG 226.0 (H) 07/05/2020 1209   HDL 43.20 07/05/2020 1209   CHOLHDL 2 07/05/2020 1209   VLDL 45.2 (H) 07/05/2020 1209   LDLCALC 57 09/25/2017 0924   LDLDIRECT 31.0 07/05/2020 1209    Hepatic Function Latest Ref Rng & Units 10/11/2020 06/08/2020 05/31/2020  Total Protein 6.0 - 8.3 g/dL 6.2 6.9 5.8(L)  Albumin 3.5 - 5.2 g/dL 4.1 3.3(L) 2.8(L)  AST 0 - 37 U/L _0 ALT 0 - 53 U/L _1 Alk Phosphatase 39 - 117 U/L 41 58 32(L)   Total Bilirubin 0.2 - 1.2 mg/dL 0.3 0.8 0.7  Bilirubin, Direct 0.0 - 0.3 mg/dL - - -    Lab Results  Component Value Date/Time   TSH 0.97 10/11/2020 10:08 AM   TSH 0.346 (L) 05/31/2020 12:34 AM   TSH 1.36 07/20/2019 09:02 AM    CBC Latest Ref Rng & Units 10/11/2020 06/08/2020 06/01/2020  WBC 4.0 - 10.5 K/uL 5.8 6.5 6.7  Hemoglobin 13.0 - 17.0 g/dL 12.6(L) 11.8(L) 10.2(L)  Hematocrit 39.0 - 52.0 % 37.5(L) 35.7(L) 31.0(L)  Platelets 150.0 - 400.0 K/uL 249.0 375 203    Lab Results  Component Value Date/Time   VD25OH 24.57 (L) 10/11/2020 10:08 AM   VD25OH 42.83 04/07/2019 08:54 AM    Clinical ASCVD: No  The ASCVD Risk score Mikey Bussing DC Jr., et al., 2013) failed to calculate for the following reasons:   The 2013 ASCVD risk score is only valid for ages 19 to 40      Social History   Tobacco Use  Smoking Status Current Every Day Smoker  . Packs/day: 0.50  . Years: 60.00  . Pack years: 30.00  . Types: Cigarettes  Smokeless Tobacco Never Used   BP Readings from Last 3 Encounters:  10/11/20 120/80  08/24/20 117/73  08/22/20 107/70   Pulse Readings from Last 3 Encounters:  10/11/20 64  08/24/20 76  08/22/20 81   Wt Readings from Last 3 Encounters:  10/11/20 165 lb 9.6 oz (75.1 kg)  08/24/20 165 lb (74.8 kg)  08/22/20 169 lb 12.8 oz (77 kg)    Assessment: Review of patient past medical history, allergies, medications, health status, including review of consultants reports, laboratory and other test data, was performed as part of comprehensive evaluation and provision of chronic care management services.   SDOH:  (Social Determinants of Health) assessments and interventions performed:    CCM Care Plan  No Known Allergies  Medications Reviewed Today    Reviewed by Gordy Councilman, CMA (Certified Medical Assistant) on 10/11/20 at 0930  Med List Status: <None>  Medication Order Taking? Sig Documenting Provider Last Dose Status Informant  Alpha-Lipoic Acid 600 MG CAPS  [id number] Yes Take 1 capsule (600 mg total) by mouth daily. [name], MD Taking Active   aspirin EC 81 MG tablet [id number] Yes Take 81 mg by mouth daily. [provider] Taking Active            Med Note Vanessa Woodbury, SUSAN   Tue Apr 15, 2018  8:47 AM)    budesonide (ENTOCORT EC) 3 MG 24 hr capsule [id number] Yes Take 9 mg by mouth daily. [provider] Taking Active   buPROPion (WELLBUTRIN XL) 300 MG 24 hr tablet [id number] Yes Take 1 tablet (300 mg total) by mouth daily. Norman Clay, MD Taking Active   Continuous Blood Gluc Sensor (FREESTYLE LIBRE 14 DAY SENSOR) MISC [id number] Yes APPLY ONE SENSOR EVERY 14 DAYS TO MONITOR BLOOD GLUCOSE LEVELS. REMOVE OLD SENSOR BEFORE APPLYING NEW SENSOR Leone Haven, MD Taking Active   diphenoxylate-atropine (LOMOTIL) 2.5-0.025 MG tablet [id number] Yes Take by mouth. [provider] Taking Active   divalproex (DEPAKOTE ER) 500 MG 24 hr tablet [id number] Yes Take 1 tablet by mouth 2 (two) times daily. [provider] Taking Active   donepezil (ARICEPT) 10 MG tablet [id number] Yes Take 10 mg by mouth at bedtime. [provider] Taking Active Self  fenofibrate (TRICOR) 145 MG tablet [id number] Yes TAKE ONE TABLET BY MOUTH DAILY Leone Haven, MD Taking Active   finasteride (PROSCAR) 5 MG tablet [id number] Yes TAKE ONE TABLET BY MOUTH DAILY. Leone Haven, MD Taking Active   gabapentin (NEURONTIN) 600 MG tablet [id number] Yes TAKE TWO TABLETS BY MOUTH THREE TIMES A DAY Leone Haven, MD Taking Active   HYDROcodone-acetaminophen [facility]) 10-325 MG tablet [id number] Yes Take 1 tablet by mouth every 4 (four) hours as needed for severe pain. Must last 30 days. [name], MD Taking Active   Hyoscyamine Sulfate SL 0.125 MG SUBL [id number] Yes Place under the tongue. [provider] Taking Active   icosapent Ethyl (VASCEPA) 1 g capsule [id number] Yes Take 2 capsules (2 g total) by mouth 2 (two) times  daily. Leone Haven, MD Taking Active   JARDIANCE 25 MG TABS tablet [id number] Yes TAKE ONE TABLET BY MOUTH DAILY Leone Haven, MD Taking Active   lisinopril-hydrochlorothiazide (ZESTORETIC) 20-25 MG tablet [id number] Yes TAKE ONE TABLET BY MOUTH TWICE A DAY Leone Haven, MD Taking Active   memantine (NAMENDA) 10 MG tablet [id number] Yes TAKE ONE TABLET BY MOUTH TWICE A DAY [provider] Taking Active   metoprolol succinate (TOPROL-XL) 25 MG 24 hr tablet [id number] Yes Take 25 mg by mouth daily. [provider] Taking Active   mirabegron ER (MYRBETRIQ) 50 MG TB24 tablet [id number] Yes Take 1 tablet (50 mg total) by mouth daily. Billey Co, MD Taking Active   Multiple  Vitamins-Minerals (CENTRUM SILVER PO) [id number] Yes Take 1 tablet by mouth daily. [provider] Taking Active   omeprazole (PRILOSEC) 20 MG capsule [id number] Yes Take 20 mg by mouth 2 (two) times daily before a meal.  [provider] Taking Active Self           Med Note Josiah Lobo, KIMBERLY   Thu Aug 09, 2016 12:05 PM)    primidone (MYSOLINE) 50 MG tablet [id number] Yes Take 150-250 mg by mouth 2 (two) times daily. Take 250 mg by mouth in the morning & take 150 mg at night. [provider] Taking Active Self  QUEtiapine (SEROQUEL) 100 MG tablet [id number] Yes Take 150 mg by mouth at bedtime. [provider] Taking Active Pharmacy Records  rosuvastatin (CRESTOR) 40 MG tablet [id number] Yes TAKE ONE TABLET BY MOUTH DAILY Leone Haven, MD Taking Active   Semaglutide,0.25 or 0.5MG/DOS, (OZEMPIC, 0.25 OR 0.5 MG/DOSE,) 2 MG/1.5ML SOPN [id number] Yes Inject 0.5 mg into the skin once a week. Leone Haven, MD Taking Active Pharmacy Records  simvastatin (ZOCOR) 40 MG tablet [id number] Yes Take by mouth. [provider] Taking Active   tamsulosin (FLOMAX) 0.4 MG CAPS capsule [id number] Yes TAKE ONE CAPSULE BY MOUTH DAILY AFTER Shawna Clamp, Randell Patient, MD  Taking Active   traZODone (DESYREL) 100 MG tablet [id number] Yes 100-150 mg at night as needed for sleep Norman Clay, MD Taking Active   Med List Note Dewayne Shorter, RN 07/27/20 0915): MR6-11-2020 UDS 10-06-2019          Patient Active Problem List   Diagnosis Date Noted  . Fatigue 10/11/2020  . OAB (overactive bladder) 08/08/2020  . AKI (acute kidney injury) ([place]) 08/08/2020  . Leg cramps 08/08/2020  . Prolonged Q-T interval on ECG 06/08/2020  . 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 Right leg pain 02/05/2020  . Impingement of left ankle joint   . Sleeping difficulty 08/24/2019  . Hiccups 08/24/2019  . Chronic pain of left ankle 02/27/2019  . Vitamin D deficiency 02/18/2019  . Left shoulder pain 02/02/2019  . Neuropathy 09/24/2018  . Lumbosacral radiculopathy 01/14/2018  . 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 Facet arthritis of lumbar region 01/14/2018  . Chronic pain syndrome 08/26/2017  . 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 Thyroid nodule 02/27/2017  . Fatty liver 02/27/2017  . Purpura ([place]) 01/29/2017  . Right hip pain 08/16/2016  . Lumbar herniated disc 02/03/2016  . Hypertriglyceridemia 12/09/2015  . Anemia 12/09/2015  . Falls 09/16/2015  . Barrett's esophagus 09/16/2015  . Chronic lumbar pain 09/16/2015  . Benign prostatic hyperplasia 09/16/2015  . Trochanteric bursitis of left hip 09/09/2015  . Headache 09/09/2015  . Difficulty in walking 06/01/2015  . Amnesia 06/01/2015  . HLD (hyperlipidemia) 02/23/2015  . DJD (degenerative joint disease) of knee 11/15/2014  . Hypertension   . Tremor, essential   . Primary localized osteoarthritis of right knee   . Anxiety and depression   . Absence of sensation 11/18/2013  . Tobacco abuse 11/18/2013  . Chronic diarrhea 11/12/2013  . Benign neoplasm of colon 08/25/2013  . 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 Type 2 diabetes mellitus ([place]) 10/06/2012    Immunization History  Administered Date(s)  Administered  . Fluad Quad(high Dose 65+) 02/05/2020  . Hepatitis A, Adult 12/02/2018  . Influenza, High Dose Seasonal PF 01/29/2017, 03/19/2018, 01/10/2019  . Influenza,inj,Quad PF,6+ Mos 03/13/2016  . Influenza-Unspecified 01/21/2015  . PFIZER(Purple Top)SARS-COV-2 Vaccination 06/01/2019, 06/22/2019  . Pneumococcal Conjugate-13 02/07/2015  .  Pneumococcal Polysaccharide-23 01/18/2018  . Tdap 09/16/2015, 01/18/2018  . Zoster Recombinat (Shingrix) 01/03/2018, 05/09/2018  . Zoster, Live 09/16/2015    Conditions to be addressed/monitored: HTN, HLD and DMII  Care Plan : Medication Management  Updates made by De Hollingshead, RPH-CPP since 10/26/2020 12:00 AM    Problem: Diabetes, Depression     Long-Range Goal: Disease Progression Prevention   Start Date: 02/16/2020  This Visit's Progress: On track  Recent Progress: On track  Priority: Medium  Note:   Current Barriers:  . Unable to independently afford treatment regimen  Pharmacist Clinical Goal(s):  Marland Kitchen Over the next 30 days, patient will verbalize ability to afford treatment regimen through collaboration with PharmD and provider.  . Over the next 90 days, patient will maintain glycemic control as evidenced by A1c through collaboration with PharmD and provider.  Interventions: . 1:1 collaboration with Leone Haven, MD regarding development and update of comprehensive plan of care as evidenced by provider attestation and co-signature . Inter-disciplinary care team collaboration (see longitudinal plan of care) . Comprehensive medication review performed; medication list updated in electronic medical record  Diabetes: . Unontrolled; current treatment: Jardiance 25 mg daily, Ozempic 0.5 mg weekly (max tolerated dose d/t GI upset/weight loss at 1 mg weekly) o Hx metformin ER - significant diarrhea that resolved upon discontinuation . Using [place] CGM.  Marland Kitchen Patient calls today to report that Jardiance copay is now expensive.  Appears he is in coverage gap. Over income for assistance for [place] or [place]. . Encouraged to continue to use CGM to evaluate glucose readings. He admits his highest elevations are after eating breakfast out and having grits and hashbrowns. Discussed dietary interventions based upon glucose readings. Will f/u in office for next appointment and discuss next steps.   Hx tobacco use: Marland Kitchen Patient reports he quit smoking after hospitalization, but has restarted about 2 months ago . Will continue to follow moving forward for patient interest in cessation  Hypertension: . Controlled per last clinic reading; current treatment: lisinopril/HCTZ 20/25 mg daily, metoprolol succinate 25 mg daily . Previously recommended to continue current regimen  Hyperlipidemia: . Controlled; current treatment: rosuvastatin 40 mg daily, fenofibrate 145 mg daily, Vascepa 2 g BID . Discussed [place] re-enrollment at our last call. Patient was to contact me with any questions or concerns.  . Previously recommended to continue current regimen at this time.  Depression/insomnia: . Improved at last visit, though notes more fatigue today to PCP. Current regimen: bupropion XL 300 mg daily, trazodone 100-150 mg QPM. Follows w/ Dr. Modesta Messing and LCSW . Previously recommended to continue current collaboration with psychiatry. Comprehensive labs drawn today to evaluate for treatable causes of fatigue.  Chronic Pain w/ recent ankle surgery: . Improved per patient report; hydrocodone/APAP 10/325 mg up to TID, gabapentin 1200 mg TID. Follows w/ Dr. Holley Raring . Denies concerns w/ constipation (though concurrent diarrhea) . Previously recommended to continue collaboration w/ Pain Management  Neurologic Conditions (pseudo dementia, tremor, sleep disorder) . Well managed per patient report; current regimen: memantine 10 mg BID, donepezil 10 mg daily; primidone 250 mg QAM, 150 mg QPM; divalproex 500 mg BID, quetiapine 150 mg  QPM; Follows w/ Dr. Melrose Nakayama.  . Previously recommended to continue current regimen at this time along with collaboration w/ Neurology  Chronic Diarrhea/Colitis: . Controlled per patient report; current regimen: budesonide 6 mg daily, Lomotil 1 tab QAM, hyoscyamine PRN added at last visit; follows w/ [facility] . Previously recommended to continue current regimen at  this time along with collaboration w/ GI.   OAB/BPH: . Improved; current treatment regimen: Myrbetriq 50 mg daily, tamsulosin 0.4 mg daily; follows w/ Dr. Diamantina Providence . Also reported today that Myrbetriq has become expensive. Unfortunately, Myrbetriq patient assistance program is only for uninsured patients. Encouraged to contact urology for samples or next steps.    Patient Goals/Self-Care Activities . Over the next 90 days, patient will:  - take medications as prescribed check glucose at least 3 times daily using CGM, document, and provide at future appointments collaborate with provider on medication access solutions engage in dietary modifications by reducing carbohydrate portion sizes Focus on tobacco cessation  Follow Up Plan: Face to Face appointment with care management team member scheduled for:  ~4 weeks as previously scheduled      Medication Assistance: Ozempic obtained through [employer]  medication assistance program.  Enrollment ends 05/20/21  Patient's preferred pharmacy is:  [facility], [place] [address] [place] [address] Phone: [phone] Fax: [phone]  [id number] [name], [place] [address] [place] [address] Phone: [phone] Fax: [phone]  [facility] [address], [place] - [place] [address] Phone: [phone] Fax: [phone]    Follow Up:  Patient agrees to Care Plan and Follow-up.  Plan:  Face to Face appointment with care management team member scheduled for: ~ 4 weeks  Catie Darnelle Maffucci, PharmD, [place], [place] Clinical Pharmacist [employer] [facility] [employer] [phone]

## 2020-10-26 NOTE — Patient Instructions (Signed)
Visit Information  PATIENT GOALS: Goals Addressed              This Visit's Progress     Patient Stated   .  Medication Monitoring (pt-stated)        Patient Goals/Self-Care Activities . Over the next 90 days, patient will:  - take medications as prescribed collaborate with provider on medication access solutions        Patient verbalizes understanding of instructions provided today and agrees to view in [place].   Plan: Face to Face appointment with care management team member scheduled for: ~ 4 weeks  Catie Darnelle Maffucci, PharmD, [place], [place] Clinical Pharmacist [employer] at [facility] [phone]

## 2020-10-26 NOTE — Telephone Encounter (Signed)
Spoke with patient. See CCM documentation

## 2020-10-27 ENCOUNTER — Telehealth: Payer: Self-pay | Admitting: Family Medicine

## 2020-10-27 DIAGNOSIS — F3131 Bipolar disorder, current episode depressed, mild: Secondary | ICD-10-CM | POA: Diagnosis not present

## 2020-10-27 DIAGNOSIS — I129 Hypertensive chronic kidney disease with stage 1 through stage 4 chronic kidney disease, or unspecified chronic kidney disease: Secondary | ICD-10-CM | POA: Diagnosis not present

## 2020-10-27 DIAGNOSIS — G250 Essential tremor: Secondary | ICD-10-CM | POA: Diagnosis not present

## 2020-10-27 DIAGNOSIS — E1142 Type 2 diabetes mellitus with diabetic polyneuropathy: Secondary | ICD-10-CM | POA: Diagnosis not present

## 2020-10-27 DIAGNOSIS — F0390 Unspecified dementia without behavioral disturbance: Secondary | ICD-10-CM | POA: Diagnosis not present

## 2020-10-27 DIAGNOSIS — E1151 Type 2 diabetes mellitus with diabetic peripheral angiopathy without gangrene: Secondary | ICD-10-CM | POA: Diagnosis not present

## 2020-10-27 DIAGNOSIS — D692 Other nonthrombocytopenic purpura: Secondary | ICD-10-CM | POA: Diagnosis not present

## 2020-10-27 DIAGNOSIS — E1122 Type 2 diabetes mellitus with diabetic chronic kidney disease: Secondary | ICD-10-CM | POA: Diagnosis not present

## 2020-10-27 DIAGNOSIS — F17200 Nicotine dependence, unspecified, uncomplicated: Secondary | ICD-10-CM | POA: Diagnosis not present

## 2020-10-27 DIAGNOSIS — I70203 Unspecified atherosclerosis of native arteries of extremities, bilateral legs: Secondary | ICD-10-CM | POA: Diagnosis not present

## 2020-10-27 DIAGNOSIS — J449 Chronic obstructive pulmonary disease, unspecified: Secondary | ICD-10-CM | POA: Diagnosis not present

## 2020-10-27 DIAGNOSIS — I959 Hypotension, unspecified: Secondary | ICD-10-CM | POA: Diagnosis not present

## 2020-10-27 NOTE — Telephone Encounter (Signed)
Home Health Nurse called to report that after their visit today they saw that the PT was hypotensive. PT also had a BP at 82/50 and had to be recheck which came at 80/60. She wanted to know if there was any change up meds and wanted to be called at [phone].

## 2020-10-28 MED ORDER — LISINOPRIL-HYDROCHLOROTHIAZIDE 20-25 MG PO TABS: 1.0000 | ORAL_TABLET | Freq: Every day | ORAL | 1 refills | Status: DC

## 2020-10-28 NOTE — Telephone Encounter (Signed)
I called and spoke with the home health nurse and informed him to inform the patient to decrease the lisinopril-hctz from twice daily to once daily.  I also informed her that the patient needs a follow up early next week with the provider to follow up on the BP. She understood and stated she would reach out to the patient and inform him.  Honesty Menta,cma

## 2020-10-28 NOTE — Addendum Note (Signed)
Addended by: Leone Haven on: 10/28/2020 08:57 AM   Modules accepted: Orders

## 2020-10-28 NOTE — Telephone Encounter (Signed)
Noted.  It looks like he has been taking his lisinopril-HCTZ twice daily.  He should decrease that to once daily.  Please see if he has had any symptoms with low blood pressure such as chest pain, shortness of breath, lightheadedness, or other symptoms.  Has he passed out?  He should be scheduled early next week with me for blood pressure check.  Thanks.

## 2020-10-31 ENCOUNTER — Telehealth: Payer: Self-pay

## 2020-10-31 ENCOUNTER — Other Ambulatory Visit: Payer: Self-pay | Admitting: Student in an Organized Health Care Education/Training Program

## 2020-10-31 MED ORDER — GEMTESA 75 MG PO TABS: 75.0000 mg | ORAL_TABLET | ORAL | 11 refills | Status: AC

## 2020-10-31 NOTE — Telephone Encounter (Signed)
Patient called stating that he cannot afford the Myrbetriq script. He says it is now $103 copay for 30days at the pharmacy the coupon did not work and would like a different medication sent in. Per last note script for James Hood was sent to [facility] to see if after PA cost would be less.  Contacted patient's local pharmacy to clarify why when he filled in April his copay was only $45 but in May was $103. Pharmacist states Myrbetriq is patient's preferred level 2 tier medication and should be only $45 copay a month but patient is now in the donut hole and is having to pay more due to this. Coupon cannot be used on Medicare patient's.  Contacted patient and explained why cost changed and that with this update of information it is possible that the James Hood will be a high cost but we will wait and see what the specialty pharmacy states. Patient verbalized understanding

## 2020-11-02 ENCOUNTER — Ambulatory Visit (INDEPENDENT_AMBULATORY_CARE_PROVIDER_SITE_OTHER): Payer: PPO | Admitting: Licensed Clinical Social Worker

## 2020-11-02 ENCOUNTER — Other Ambulatory Visit: Payer: Self-pay

## 2020-11-02 DIAGNOSIS — F3341 Major depressive disorder, recurrent, in partial remission: Secondary | ICD-10-CM | POA: Diagnosis not present

## 2020-11-02 NOTE — Progress Notes (Signed)
  THERAPIST PROGRESS NOTE  Session Time: 8-9a  Participation Level: Active  Behavioral Response: Neat and Well GroomedAlertAnxious  Type of Therapy: Individual Therapy  Treatment Goals addressed: Anxiety and Coping  Interventions: CBT and Other: trauma focused  Summary: James Hood is a 80 y.o. male who presents with improving symptoms related to depression diagnosis. Pt reports that overall mood has been stable and that pt is managing situational stressors well  Allowed pt to explore and express thoughts and feelings associated with recent life situations and external stressors. Pt wanted to explore relationship with his mother and father and how that triggered thoughts and feelings when he was a child, adolescent, and young adult. Discussed pts marriages and previous relationships. Explored marriage, divorce, and remarriage to second wife. Discussed the loss of pts parents--mother and father's death three months later.  Pt would like relationship with ex wife to improve so that they can coparent/cograndparent well--pts ex wife wants nothing to do with him "I don't blame her". Allowed pt to share circumstances of relationship conflict between him and ex wife.  Pt is happy that the construction around his house is complete. Discussed past and present financial stressors.   Continued recommendations are as follows: self care behaviors, positive social engagements, focusing on overall work/home/life balance, and focusing on positive physical and emotional wellness.    Suicidal/Homicidal: No  Therapist Response: James Hood is able to identify an anxiety coping mechanism that has been successful in the past and increase its use. James Hood is able to verbalize an understanding of how relaxation and diversion activities decrease levels of anxiety. James Hood is exploring anxiety and trauma triggers from the past and understanding the relationship to present anxiety. James Hood is continuing to develop skills to  reduce irritability and increase normal social interaction with family and friends. These behaviors are reflective of both personal growth and progress. Treatment to continue as indicated.  Plan: Return again in 4 weeks.  Diagnosis: Axis I: MDD, recurrent, partial remission    Axis II: No diagnosis    Rachel Bo Kiondre Grenz, LCSW 11/02/2020

## 2020-11-08 ENCOUNTER — Telehealth: Payer: Self-pay

## 2020-11-08 NOTE — Telephone Encounter (Signed)
D and spoke with the patient and informed him that his medication was ready for pick up and he understood.  Ysidro Ramsay,cma
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
## 2020-11-10 ENCOUNTER — Ambulatory Visit: Payer: PPO | Admitting: Pharmacist

## 2020-11-10 DIAGNOSIS — E782 Mixed hyperlipidemia: Secondary | ICD-10-CM

## 2020-11-10 DIAGNOSIS — I10 Essential (primary) hypertension: Secondary | ICD-10-CM

## 2020-11-10 DIAGNOSIS — N1831 Chronic kidney disease, stage 3a: Secondary | ICD-10-CM

## 2020-11-10 DIAGNOSIS — E1142 Type 2 diabetes mellitus with diabetic polyneuropathy: Secondary | ICD-10-CM

## 2020-11-10 DIAGNOSIS — N3281 Overactive bladder: Secondary | ICD-10-CM

## 2020-11-10 MED ORDER — OMEGA-3-ACID ETHYL ESTERS 1 G PO CAPS: 2.0000 g | ORAL_CAPSULE | Freq: Two times a day (BID) | ORAL | 3 refills | Status: DC

## 2020-11-10 NOTE — Chronic Care Management (AMB) (Signed)
Chronic Care Management Pharmacy Note  11/10/2020 Name:  James Hood MRN:  [id number] DOB:  31-Oct-1940   Subjective: James Hood is an 80 y.o. year old male who is a primary patient of Caryl Bis, Angela Adam, MD.  The CCM team was consulted for assistance with disease management and care coordination needs.    Engaged with patient by telephone for  response to call with questions regarding medication management  in response to provider referral for pharmacy case management and/or care coordination services.   Consent to Services:  The patient was given information about Chronic Care Management services, agreed to services, and gave verbal consent prior to initiation of services.  Please see initial visit note for detailed documentation.   Patient Care Team: Leone Haven, MD as PCP - General (Family Medicine) De Hollingshead, RPH-CPP as Pharmacist (Pharmacist)   Objective:  Lab Results  Component Value Date   CREATININE 1.11 10/11/2020   CREATININE 1.47 09/08/2020   CREATININE 1.21 07/20/2020    Lab Results  Component Value Date   HGBA1C 7.1 (H) 09/08/2020   Last diabetic Eye exam:  Lab Results  Component Value Date/Time   HMDIABEYEEXA No Retinopathy 01/27/2020 03:37 PM    Last diabetic Foot exam: No results found for: HMDIABFOOTEX      Component Value Date/Time   CHOL 99 07/05/2020 1209   TRIG 226.0 (H) 07/05/2020 1209   HDL 43.20 07/05/2020 1209   CHOLHDL 2 07/05/2020 1209   VLDL 45.2 (H) 07/05/2020 1209   LDLCALC 57 09/25/2017 0924   LDLDIRECT 31.0 07/05/2020 1209    Hepatic Function Latest Ref Rng & Units 10/11/2020 06/08/2020 05/31/2020  Total Protein 6.0 - 8.3 g/dL 6.2 6.9 5.8(L)  Albumin 3.5 - 5.2 g/dL 4.1 3.3(L) 2.8(L)  AST 0 - 37 U/L _0 ALT 0 - 53 U/L _1 Alk Phosphatase 39 - 117 U/L 41 58 32(L)  Total Bilirubin 0.2 - 1.2 mg/dL 0.3 0.8 0.7  Bilirubin, Direct 0.0 - 0.3 mg/dL - - -    Lab Results  Component Value  Date/Time   TSH 0.97 10/11/2020 10:08 AM   TSH 0.346 (L) 05/31/2020 12:34 AM   TSH 1.36 07/20/2019 09:02 AM    CBC Latest Ref Rng & Units 10/11/2020 06/08/2020 06/01/2020  WBC 4.0 - 10.5 K/uL 5.8 6.5 6.7  Hemoglobin 13.0 - 17.0 g/dL 12.6(L) 11.8(L) 10.2(L)  Hematocrit 39.0 - 52.0 % 37.5(L) 35.7(L) 31.0(L)  Platelets 150.0 - 400.0 K/uL 249.0 375 203    Lab Results  Component Value Date/Time   VD25OH 24.57 (L) 10/11/2020 10:08 AM   VD25OH 42.83 04/07/2019 08:54 AM    Clinical ASCVD: No  The ASCVD Risk score Mikey Bussing DC Jr., et al., 2013) failed to calculate for the following reasons:   The 2013 ASCVD risk score is only valid for ages 89 to 43     Social History   Tobacco Use  Smoking Status Every Day   Packs/day: 0.50   Years: 60.00   Pack years: 30.00   Types: Cigarettes  Smokeless Tobacco Never   BP Readings from Last 3 Encounters:  10/11/20 120/80  08/24/20 117/73  08/22/20 107/70   Pulse Readings from Last 3 Encounters:  10/11/20 64  08/24/20 76  08/22/20 81   Wt Readings from Last 3 Encounters:  10/11/20 165 lb 9.6 oz (75.1 kg)  08/24/20 165 lb (74.8 kg)  08/22/20 169 lb 12.8 oz (77 kg)  Assessment: Review of patient past medical history, allergies, medications, health status, including review of consultants reports, laboratory and other test data, was performed as part of comprehensive evaluation and provision of chronic care management services.   SDOH:  (Social Determinants of Health) assessments and interventions performed: medication assistance evaluation   CCM Care Plan  No Known Allergies  Medications Reviewed Today     Reviewed by Gordy Councilman, CMA (Certified Medical Assistant) on 10/11/20 at [place] [facility] Status: <None>   Medication Order Taking? Sig Documenting Provider Last Dose Status Informant  Alpha-Lipoic Acid 600 MG CAPS [id number] Yes Take 1 capsule (600 mg total) by mouth daily. [name], MD Taking Active   aspirin EC 81 MG  tablet [id number] Yes Take 81 mg by mouth daily. [provider] Taking Active            Med Note Vanessa Casa [place], SUSAN   Tue Apr 15, 2018  8:47 AM)    budesonide (ENTOCORT EC) 3 MG 24 hr capsule [id number] Yes Take 9 mg by mouth daily. [provider] Taking Active   buPROPion (WELLBUTRIN XL) 300 MG 24 hr tablet [id number] Yes Take 1 tablet (300 mg total) by mouth daily. Norman Clay, MD Taking Active   Continuous Blood Gluc Sensor (FREESTYLE LIBRE 14 DAY SENSOR) MISC [id number] Yes APPLY ONE SENSOR EVERY 14 DAYS TO MONITOR BLOOD GLUCOSE LEVELS. REMOVE OLD SENSOR BEFORE APPLYING NEW SENSOR Leone Haven, MD Taking Active   diphenoxylate-atropine (LOMOTIL) 2.5-0.025 MG tablet [id number] Yes Take by mouth. [provider] Taking Active   divalproex (DEPAKOTE ER) 500 MG 24 hr tablet [id number] Yes Take 1 tablet by mouth 2 (two) times daily. [provider] Taking Active   donepezil (ARICEPT) 10 MG tablet [id number] Yes Take 10 mg by mouth at bedtime. [provider] Taking Active Self  fenofibrate (TRICOR) 145 MG tablet [id number] Yes TAKE ONE TABLET BY MOUTH DAILY Leone Haven, MD Taking Active   finasteride (PROSCAR) 5 MG tablet [id number] Yes TAKE ONE TABLET BY MOUTH DAILY. Leone Haven, MD Taking Active   gabapentin (NEURONTIN) 600 MG tablet [id number] Yes TAKE TWO TABLETS BY MOUTH THREE TIMES A DAY Leone Haven, MD Taking Active   HYDROcodone-acetaminophen [facility]) 10-325 MG tablet [id number] Yes Take 1 tablet by mouth every 4 (four) hours as needed for severe pain. Must last 30 days. [name], MD Taking Active   Hyoscyamine Sulfate SL 0.125 MG SUBL [id number] Yes Place under the tongue. [provider] Taking Active   icosapent Ethyl (VASCEPA) 1 g capsule [id number] Yes Take 2 capsules (2 g total) by mouth 2 (two) times daily. Leone Haven, MD Taking Active   JARDIANCE 25 MG TABS tablet [id number] Yes TAKE ONE TABLET BY MOUTH  DAILY Leone Haven, MD Taking Active   lisinopril-hydrochlorothiazide (ZESTORETIC) 20-25 MG tablet [id number] Yes TAKE ONE TABLET BY MOUTH TWICE A DAY Leone Haven, MD Taking Active   memantine (NAMENDA) 10 MG tablet [id number] Yes TAKE ONE TABLET BY MOUTH TWICE A DAY [provider] Taking Active   metoprolol succinate (TOPROL-XL) 25 MG 24 hr tablet [id number] Yes Take 25 mg by mouth daily. [provider] Taking Active   mirabegron ER (MYRBETRIQ) 50 MG TB24 tablet [id number] Yes Take 1 tablet (50 mg total) by mouth daily. Billey Co, MD Taking Active   Multiple Vitamins-Minerals (CENTRUM SILVER PO) [id number] Yes Take 1 tablet by mouth daily. [provider] Taking Active   omeprazole (  PRILOSEC) 20 MG capsule [id number] Yes Take 20 mg by mouth 2 (two) times daily before a meal.  [provider] Taking Active Self           Med Note Josiah Lobo, KIMBERLY   Thu Aug 09, 2016 12:05 PM)    primidone (MYSOLINE) 50 MG tablet [id number] Yes Take 150-250 mg by mouth 2 (two) times daily. Take 250 mg by mouth in the morning & take 150 mg at night. [provider] Taking Active Self  QUEtiapine (SEROQUEL) 100 MG tablet [id number] Yes Take 150 mg by mouth at bedtime. [provider] Taking Active Pharmacy Records  rosuvastatin (CRESTOR) 40 MG tablet [id number] Yes TAKE ONE TABLET BY MOUTH DAILY Leone Haven, MD Taking Active   Semaglutide,0.25 or 0.5MG/DOS, (OZEMPIC, 0.25 OR 0.5 MG/DOSE,) 2 MG/1.5ML SOPN [id number] Yes Inject 0.5 mg into the skin once a week. Leone Haven, MD Taking Active Pharmacy Records  simvastatin (ZOCOR) 40 MG tablet [id number] Yes Take by mouth. [provider] Taking Active   tamsulosin (FLOMAX) 0.4 MG CAPS capsule [id number] Yes TAKE ONE CAPSULE BY MOUTH DAILY AFTER Shawna Clamp, Randell Patient, MD Taking Active   traZODone (DESYREL) 100 MG tablet [id number] Yes 100-150 mg at night as needed for sleep Norman Clay,  MD Taking Active   Med List Note Dewayne Shorter, RN 07/27/20 0915): MR6-11-2020 UDS 10-06-2019            Patient Active Problem List   Diagnosis Date Noted   Fatigue 10/11/2020   OAB (overactive bladder) 08/08/2020   AKI (acute kidney injury) ([place]) 08/08/2020   Leg cramps 08/08/2020   Prolonged Q-T interval on ECG 06/08/2020   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   Right leg pain 02/05/2020   Impingement of left ankle joint    Sleeping difficulty 08/24/2019   Hiccups 08/24/2019   Chronic pain of left ankle 02/27/2019   Vitamin D deficiency 02/18/2019   Left shoulder pain 02/02/2019   Neuropathy 09/24/2018   Lumbosacral radiculopathy 01/14/2018   Primary osteoarthritis of left ankle 01/14/2018   Facet arthritis of lumbar region 01/14/2018   Chronic pain syndrome 08/26/2017   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   Thyroid nodule 02/27/2017   Fatty liver 02/27/2017   Purpura ([place]) 01/29/2017   Right hip pain 08/16/2016   Lumbar herniated disc 02/03/2016   Hypertriglyceridemia 12/09/2015   Anemia 12/09/2015   Falls 09/16/2015   Barrett's esophagus 09/16/2015   Chronic lumbar pain 09/16/2015   Benign prostatic hyperplasia 09/16/2015   Trochanteric bursitis of left hip 09/09/2015   Headache 09/09/2015   Difficulty in walking 06/01/2015   Amnesia 06/01/2015   HLD (hyperlipidemia) 02/23/2015   DJD (degenerative joint disease) of knee 11/15/2014   Hypertension    Tremor, essential    Primary localized osteoarthritis of right knee    Anxiety and depression    Absence of sensation 11/18/2013   Tobacco abuse 11/18/2013   Chronic diarrhea 11/12/2013   Benign neoplasm of colon 08/25/2013   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   Type 2 diabetes mellitus ([place]) 10/06/2012    Immunization History  Administered Date(s) Administered   Fluad Quad(high Dose 65+) 02/05/2020   Hepatitis A, Adult 12/02/2018   Influenza, High Dose Seasonal PF 01/29/2017, 03/19/2018, 01/10/2019    Influenza,inj,Quad PF,6+ Mos 03/13/2016   Influenza-Unspecified 01/21/2015   PFIZER(Purple Top)SARS-COV-2 Vaccination 06/01/2019, 06/22/2019   Pneumococcal Conjugate-13 02/07/2015   Pneumococcal Polysaccharide-23 01/18/2018   Tdap 09/16/2015, 01/18/2018   Zoster Recombinat (Shingrix) 01/03/2018, 05/09/2018   Zoster,  Live 09/16/2015    Conditions to be addressed/monitored: HTN, HLD, DMII, and Depression  Care Plan : Medication Management  Updates made by De Hollingshead, RPH-CPP since 11/10/2020 12:00 AM     Problem: Diabetes, Depression      Long-Range Goal: Disease Progression Prevention   Start Date: 02/16/2020  This Visit's Progress: On track  Recent Progress: On track  Priority: Medium  Note:   Current Barriers:  Unable to independently afford treatment regimen  Pharmacist Clinical Goal(s):  Over the next 30 days, patient will verbalize ability to afford treatment regimen through collaboration with PharmD and provider.  Over the next 90 days, patient will maintain glycemic control as evidenced by A1c through collaboration with PharmD and provider.  Interventions: 1:1 collaboration with Leone Haven, MD regarding development and update of comprehensive plan of care as evidenced by provider attestation and co-signature Inter-disciplinary care team collaboration (see longitudinal plan of care) Comprehensive medication review performed; medication list updated in electronic medical record  Diabetes: Unontrolled; current treatment: Jardiance 25 mg daily, Ozempic 0.5 mg weekly (max tolerated dose d/t GI upset/weight loss at 1 mg weekly) Hx metformin ER - significant diarrhea that resolved upon discontinuation Using Libre 2 CGM Previously recommended to continue current regimen at this time  Hx tobacco use: Patient reports he quit smoking after hospitalization, but has restarted about 2 months ago Will continue to follow moving forward for patient interest in  cessation  Hypertension: Controlled per last clinic reading; current treatment: lisinopril/HCTZ 20/25 mg daily, metoprolol succinate 25 mg daily Previously reported low BP per [facility] RN. HH RN was supposed to contact patient to schedule f/u with Dr. Caryl Bis, this was not done. Assisted patient in scheduling f/u with Dr. Caryl Bis next week.  Hyperlipidemia: Controlled; current treatment: rosuvastatin 40 mg daily, fenofibrate 145 mg daily, Vascepa 2 g BID Patient calls today with questions about how to refill Vascepa. Reviewed that we had applied for [employer] previously, but the grant is closed at this time. Vascepa appears to be Tier 4 on his insurance.  No affordability options for Vascepa. D/C, start prescription omega-3-fatty acids 2 g BID. Patient amenable.  Depression/insomnia: Improved. Current regimen: bupropion XL 300 mg daily, trazodone 100-150 mg QPM. Follows w/ Dr. Modesta Messing and LCSW Previously recommended to continue current collaboration with psychiatry.   Chronic Pain w/ recent ankle surgery: Improved per patient report; hydrocodone/APAP 10/325 mg up to TID, gabapentin 1200 mg TID. Follows w/ Dr. Holley Raring Denies concerns w/ constipation (though concurrent diarrhea) Previously recommended to continue collaboration w/ Pain Management  Neurologic Conditions (pseudo dementia, tremor, sleep disorder) Well managed per patient report; current regimen: memantine 10 mg BID, donepezil 10 mg daily; primidone 250 mg QAM, 150 mg QPM; divalproex 500 mg BID, quetiapine 150 mg QPM; Follows w/ Dr. Melrose Nakayama.  Previously recommended to continue current regimen at this time along with collaboration w/ Neurology  Chronic Diarrhea/Colitis: Controlled per patient report; current regimen: budesonide 6 mg daily, Lomotil 1 tab QAM, hyoscyamine PRN added at last visit; follows w/ [facility] Previously recommended to continue current regimen at this time along with collaboration w/ GI.    OAB/BPH: Improved; current treatment regimen: Myrbetriq 50 mg daily, tamsulosin 0.4 mg daily; follows w/ Dr. Diamantina Providence Previously recommended to continue current regimen at this time    Patient Goals/Self-Care Activities Over the next 90 days, patient will:  - take medications as prescribed check glucose at least 3 times daily using CGM, document, and provide at future appointments  collaborate with provider on medication access solutions engage in dietary modifications by reducing carbohydrate portion sizes Focus on tobacco cessation  Follow Up Plan: Face to Face appointment with care management team member scheduled for:  ~2 weeks as previously scheduled      Medication Assistance: Ozempic obtained through [employer]  medication assistance program.  Enrollment ends 05/20/21  Patient's preferred pharmacy is:  [facility] [name], [place] [address] Phone: [phone] Fax: [phone]  [name], [place] [address] [place] [address] Phone: [phone] Fax: [phone]  [facility] [address], [place] - [place] [address] Phone: [phone] Fax: [phone]  [place] [facility], [place] [address] [place] [address] Phone: [phone] Fax: [phone]    Follow Up:  Patient agrees to Care Plan and Follow-up.  Plan: Telephone follow up appointment with care management team member scheduled for:  ~ 2 weeks as previously scheduled  Catie Darnelle Maffucci, PharmD, [place], [place] Clinical Pharmacist [employer] at [facility] [phone]

## 2020-11-10 NOTE — Patient Instructions (Signed)
Visit Information  PATIENT GOALS:  Goals Addressed               This Visit's Progress     Patient Stated     Medication Monitoring (pt-stated)        Patient Goals/Self-Care Activities Over the next 90 days, patient will:  - take medications as prescribed collaborate with provider on medication access solutions          Patient verbalizes understanding of instructions provided today and agrees to view in [place].   Plan: Telephone follow up appointment with care management team member scheduled for:  ~ 2 weeks as previously scheduled  Catie Darnelle Maffucci, PharmD, [place], [place] Clinical Pharmacist [employer] at [facility] [phone]

## 2020-11-14 ENCOUNTER — Other Ambulatory Visit: Payer: Self-pay

## 2020-11-14 ENCOUNTER — Ambulatory Visit (INDEPENDENT_AMBULATORY_CARE_PROVIDER_SITE_OTHER): Payer: PPO | Admitting: Psychiatry

## 2020-11-14 ENCOUNTER — Encounter: Payer: Self-pay | Admitting: Psychiatry

## 2020-11-14 VITALS — BP 100/61 | HR 80 | Temp 97.8°F | Wt 166.2 lb

## 2020-11-14 DIAGNOSIS — F3341 Major depressive disorder, recurrent, in partial remission: Secondary | ICD-10-CM | POA: Diagnosis not present

## 2020-11-14 DIAGNOSIS — G4700 Insomnia, unspecified: Secondary | ICD-10-CM | POA: Diagnosis not present

## 2020-11-14 MED ORDER — BUPROPION HCL ER (XL) 300 MG PO TB24: 300.0000 mg | ORAL_TABLET | Freq: Every day | ORAL | 0 refills | Status: DC

## 2020-11-14 MED ORDER — TRAZODONE HCL 100 MG PO TABS: ORAL_TABLET | ORAL | 0 refills | Status: DC

## 2020-11-14 NOTE — Patient Instructions (Signed)
1. Continue bupropion XR 300 mg daily  2. Continue Trazodone 100-150 mg at night as needed for insomnia 2. Next appointment: 9/12 at 8:30

## 2020-11-15 ENCOUNTER — Ambulatory Visit
Payer: PPO | Attending: Student in an Organized Health Care Education/Training Program | Admitting: Student in an Organized Health Care Education/Training Program

## 2020-11-15 ENCOUNTER — Encounter: Payer: Self-pay | Admitting: Student in an Organized Health Care Education/Training Program

## 2020-11-15 VITALS — BP 99/66 | HR 65 | Temp 96.8°F | Resp 16 | Ht 70.0 in | Wt 166.0 lb

## 2020-11-15 DIAGNOSIS — M5386 Other specified dorsopathies, lumbar region: Secondary | ICD-10-CM | POA: Diagnosis not present

## 2020-11-15 DIAGNOSIS — F1190 Opioid use, unspecified, uncomplicated: Secondary | ICD-10-CM | POA: Diagnosis not present

## 2020-11-15 DIAGNOSIS — M5417 Radiculopathy, lumbosacral region: Secondary | ICD-10-CM | POA: Insufficient documentation

## 2020-11-15 DIAGNOSIS — T402X5A Adverse effect of other opioids, initial encounter: Secondary | ICD-10-CM

## 2020-11-15 DIAGNOSIS — T85192S Other mechanical complication of implanted electronic neurostimulator (electrode) of spinal cord, sequela: Secondary | ICD-10-CM | POA: Diagnosis not present

## 2020-11-15 DIAGNOSIS — M19072 Primary osteoarthritis, left ankle and foot: Secondary | ICD-10-CM | POA: Insufficient documentation

## 2020-11-15 DIAGNOSIS — G894 Chronic pain syndrome: Secondary | ICD-10-CM | POA: Diagnosis not present

## 2020-11-15 DIAGNOSIS — K5903 Drug induced constipation: Secondary | ICD-10-CM | POA: Insufficient documentation

## 2020-11-15 MED ORDER — HYDROCODONE-ACETAMINOPHEN 10-325 MG PO TABS: 1.0000 | ORAL_TABLET | ORAL | 0 refills | Status: AC | PRN

## 2020-11-15 MED ORDER — NALOXEGOL OXALATE 12.5 MG PO TABS: 12.5000 mg | ORAL_TABLET | Freq: Every day | ORAL | 2 refills | Status: AC

## 2020-11-15 NOTE — Progress Notes (Signed)
PROVIDER NOTE: Information contained herein reflects review and annotations entered in association with encounter. Interpretation of such information and data should be left to medically-trained personnel. Information provided to patient can be located elsewhere in the medical record under "Patient Instructions". Document created using STT-dictation technology, any transcriptional errors that may result from process are unintentional.    Patient: James Hood  Service Category: E/M  Provider:  , MD  DOB: 01/03/1941  DOS: 11/15/2020  Specialty: Interventional Pain Management  MRN: [id number]  Setting: Ambulatory outpatient  PCP: Sonnenberg, Eric G, MD  Type: Established Patient    Referring Provider: Sonnenberg, Eric G, MD  Location: Office  Delivery: Face-to-face     HPI  [name], a 79 y.o. year old male, is here today because of his Lumbosacral radiculopathy [M54.17]. [name] primary complain today is Back Pain Last encounter: My last encounter with him was on 07/27/2020. Pertinent problems: [name] has Primary localized osteoarthritis of right knee; DJD (degenerative joint disease) of knee; Chronic lumbar pain; Lumbar herniated disc; Chronic pain syndrome; Failed back surgical syndrome; Lumbosacral radiculopathy; Facet arthritis of lumbar region; Neuropathy; Chronic pain of left ankle; and Impingement of left ankle joint on their pertinent problem list. Pain Assessment: Severity of Chronic pain is reported as a 4 /10. Location: Back Lower/denies. Onset: More than a month ago. Quality: Aching, Sharp. Timing: Constant. Modifying factor(s): meds. Vitals:  height is 5' 10" (1.778 m) and weight is 166 lb (75.3 kg). His temporal temperature is 96.8 F (36 C) (abnormal). His blood pressure is 99/66 and his pulse is 65. His respiration is 16 and oxygen saturation is 97%.   Reason for encounter: medication management.    No change in medical history since last visit.   Patient's pain is at baseline.  Patient continues multimodal pain regimen as prescribed.  States that it provides pain relief and improvement in functional status. Started seeing psychiatrist, on Trazodone for sleep aid Also seeing therapist- talk therapy which is helping with anger management Has an appointment with Dr Sonnenberg to address low blood pressure   Pharmacotherapy Assessment  Analgesic: Hydrocodone 10 mg every 4 hours as needed, quantity 180/month; MME equals 60    Monitoring: [place] PMP: PDMP reviewed during this encounter.       Pharmacotherapy: Opioid-induced constipation (OIC)(K59.03, T40.2X5A)- plan for [employer] Compliance: No problems identified. Effectiveness: Clinically acceptable.  Welborn, Susan, RN  11/15/2020  8:38 AM  Sign when Signing Visit Nursing Pain Medication Assessment:  Safety precautions to be maintained throughout the outpatient stay will include: orient to surroundings, keep bed in low position, maintain call bell within reach at all times, provide assistance with transfer out of bed and ambulation.  Medication Inspection Compliance: Pill count conducted under aseptic conditions, in front of the patient. Neither the pills nor the bottle was removed from the patient's sight at any time. Once count was completed pills were immediately returned to the patient in their original bottle.  Medication: Hydrocodone/APAP Pill/Patch Count:  96 of 180 pills remain Pill/Patch Appearance: Markings consistent with prescribed medication Bottle Appearance: Standard pharmacy container. Clearly labeled. Filled Date: 06 / 06 / 2022 Last Medication intake:  Today      UDS:  Summary  Date Value Ref Range Status  10/06/2019 Note  Final    Comment:    ==================================================================== Tox[employer] Select 13 (MW) ==================================================================== Test                               Result       Flag        Units Drug Present   Oxycodone                      900                     ng/mg creat   Oxymorphone                    174                     ng/mg creat   Noroxycodone                   1620                    ng/mg creat    Sources of oxycodone include scheduled prescription medications.    Oxymorphone and noroxycodone are expected metabolites of oxycodone.    Oxymorphone is also available as a scheduled prescription medication.   Phenobarbital                  PRESENT    Phenobarbital may be administered as a prescription drug; it is also    an expected metabolite of primidone and mephobarbital. ==================================================================== Test                      Result    Flag   Units      Ref Range   Creatinine              35               mg/dL      >=20 ==================================================================== Declared Medications:  Medication list was not provided. ==================================================================== For clinical consultation, please call [phone]. ====================================================================      ROS  Constitutional: Denies any fever or chills Gastrointestinal: No reported hemesis, hematochezia, vomiting, or acute GI distress Musculoskeletal: Denies any acute onset joint swelling, redness, loss of ROM, or weakness Neurological: No reported episodes of acute onset apraxia, aphasia, dysarthria, agnosia, amnesia, paralysis, loss of coordination, or loss of consciousness  Medication Review  FreeStyle Libre 14 Day Sensor, HYDROcodone-acetaminophen, Hyoscyamine Sulfate SL, Multiple Vitamins-Minerals, QUEtiapine, Semaglutide(0.25 or 0.5MG/DOS), aspirin EC, buPROPion, budesonide, diphenoxylate-atropine, divalproex, donepezil, empagliflozin, fenofibrate, finasteride, gabapentin, lisinopril-hydrochlorothiazide, memantine, metoprolol succinate, mirabegron ER, naloxegol oxalate, omega-3  acid ethyl esters, omeprazole, primidone, rosuvastatin, tamsulosin, and traZODone  History Review  Allergy: [name] has No Known Allergies. Drug: [name]  reports no history of drug use. Alcohol:  reports previous alcohol use. Tobacco:  reports that he has been smoking cigarettes. He has a 30.00 pack-year smoking history. He has never used smokeless tobacco. Social: [name]  reports that he has been smoking cigarettes. He has a 30.00 pack-year smoking history. He has never used smokeless tobacco. He reports previous alcohol use. He reports that he does not use drugs. 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 Surgical: [name]  has a past surgical history that includes esophageal stretch; Tonsillectomy; Total knee arthroplasty (Right, 11/15/2014); Lumbar laminectomy/decompression microdiscectomy (Left, 02/03/2016); Colonoscopy with propofol (N/A, 09/14/2016); Joint replacement (Right, 2016); Pulse generator implant (N/A, 08/21/2017); Back surgery; Pulse generator implant (Right, 04/02/2018); and Ankle arthroscopy (Left, 09/29/2019). Family: family history includes Depression in his father; Heart attack in his mother; Heart disease in his mother; Hypertension in his father.  Laboratory Chemistry Profile   Renal Lab Results  Component Value Date   BUN 32 (H) 10/11/2020   CREATININE 1.11 10/11/2020    GFR 62.63 10/11/2020   GFRAA >60 09/25/2019   GFRNONAA 55 (L) 06/08/2020    Hepatic Lab Results  Component Value Date   AST 15 10/11/2020   ALT 11 10/11/2020   ALBUMIN 4.1 10/11/2020   ALKPHOS 41 10/11/2020   AMMONIA 14 08/09/2016    Electrolytes Lab Results  Component Value Date   NA 137 10/11/2020   K 4.4 10/11/2020   CL 100 10/11/2020   CALCIUM 9.8 10/11/2020   MG 1.4 (L) 09/08/2020    Bone Lab Results  Component Value Date   VD25OH 24.57 (L) 10/11/2020    Inflammation (CRP: Acute Phase) (ESR: Chronic Phase) Lab Results  Component Value Date   ESRSEDRATE 15 07/20/2019   LATICACIDVEN 1.9 06/08/2020          Note: Above Lab results reviewed.  Recent Imaging Review  MR Brain Wo Contrast CLINICAL DATA:  Ataxia  EXAM: MRI HEAD WITHOUT CONTRAST  TECHNIQUE: Multiplanar, multiecho pulse sequences of the brain and surrounding structures were obtained without intravenous contrast.  COMPARISON:  None.  FINDINGS: Brain: No acute infarct, mass effect or extra-axial collection. No acute or chronic hemorrhage. There is multifocal hyperintense T2-weighted signal within the white matter. Advanced atrophy for age. The midline structures are normal.  Vascular: Major flow voids are preserved.  Skull and upper cervical spine: Normal calvarium and skull base. Visualized upper cervical spine and soft tissues are normal.  Sinuses/Orbits:No paranasal sinus fluid levels or advanced mucosal thickening. No mastoid or middle ear effusion. Normal orbits.  IMPRESSION: 1. No acute intracranial abnormality. 2. 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  Electronically Signed   By: Kevin  Herman M.D.   On: 07/12/2020 03:40 Note: Reviewed        Physical Exam  General appearance: Well nourished, well developed, and well hydrated. In no apparent acute distress Mental status: Alert, oriented x 3 (person, place, & time)       Respiratory: No evidence of  acute respiratory distress Eyes: PERLA Vitals: BP 99/66 (BP Location: Right Arm, Patient Position: Sitting, Cuff Size: Normal)   Pulse 65   Temp (!) 96.8 F (36 C) (Temporal)   Resp 16   Ht 5' 10" (1.778 m)   Wt 166 lb (75.3 kg)   SpO2 97%   BMI 23.82 kg/m  BMI: Estimated body mass index is 23.82 kg/m as calculated from the following:   Height as of this encounter: 5' 10" (1.778 m).   Weight as of this encounter: 166 lb (75.3 kg). Ideal: Ideal body weight: 73 kg (160 lb 15 oz) Adjusted ideal body weight: 73.9 kg (162 lb 15.4 oz)  +low back pain, right leg and right ankle pain 5 out of 5 strength bilateral lower extremity: Plantar flexion, dorsiflexion, knee flexion, knee extension.  Assessment   Status Diagnosis  Controlled Controlled Worsened 1. Lumbosacral radiculopathy   2. Chronic, continuous use of opioids   3. Therapeutic opioid-induced   constipation (OIC)   4. Primary osteoarthritis of left ankle   5. Sciatica of left side associated with disorder of lumbar spine   6. 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 Chronic pain syndrome      Updated Problems: Problem  Chronic, Continuous Use of Opioids  Therapeutic Opioid-Induced Constipation (Oic)    Plan of Care   [name] has a current medication list which includes the following long-term medication(s): bupropion, [START ON 11/21/2020] bupropion, donepezil, fenofibrate, gabapentin, hyoscyamine sulfate sl, lisinopril-hydrochlorothiazide, memantine, memantine, metoprolol succinate, mirabegron er, omega-3 acid ethyl esters, omeprazole, primidone, quetiapine, rosuvastatin, and [START ON 11/21/2020] trazodone.  Pharmacotherapy (Medications Ordered): Meds ordered this encounter  Medications   HYDROcodone-acetaminophen (NORCO) 10-325 MG tablet    Sig: Take 1 tablet by mouth every 4 (four) hours as needed for severe pain. Must last 30 days.    Dispense:  180 tablet     Refill:  0    Chronic Pain: STOP Act (Not applicable) Fill 1 day early if closed on refill date. Avoid benzodiazepines within 8 hours of opioids   HYDROcodone-acetaminophen (NORCO) 10-325 MG tablet    Sig: Take 1 tablet by mouth every 4 (four) hours as needed for severe pain. Must last 30 days.    Dispense:  180 tablet    Refill:  0    Chronic Pain: STOP Act (Not applicable) Fill 1 day early if closed on refill date. Avoid benzodiazepines within 8 hours of opioids   HYDROcodone-acetaminophen (NORCO) 10-325 MG tablet    Sig: Take 1 tablet by mouth every 4 (four) hours as needed for severe pain. Must last 30 days.    Dispense:  180 tablet    Refill:  0    Chronic Pain: STOP Act (Not applicable) Fill 1 day early if closed on refill date. Avoid benzodiazepines within 8 hours of opioids   naloxegol oxalate (MOVANTIK) 12.5 MG TABS tablet    Sig: Take 1 tablet (12.5 mg total) by mouth daily. Do not break tablet. Take on empty stomach 1 hour before or 2 hours after a meal.    Dispense:  30 tablet    Refill:  2    Fill one day early if pharmacy is closed on scheduled refill date. Generic permitted. Do not send renewal requests.   Continue Gabapentin as prescribed.  Orders:  Orders Placed This Encounter  Procedures   ToxASSURE Select 13 (MW), Urine    Volume: 30 ml(s). Minimum 3 ml of urine is needed. Document temperature of fresh sample. Indications: Long term (current) use of opiate analgesic [phone])    Order Specific Question:   Release to patient    Answer:   Immediate   Follow-up plan:   Return in about 3 months (around 02/15/2021) for Medication Management, in person.    Recent Visits No visits were found meeting these conditions. Showing recent visits within past 90 days and meeting all other requirements Today's Visits Date Type Provider Dept  11/15/20 Office Visit [name], MD [facility]  Showing today's visits and meeting all other requirements Future  Appointments No visits were found meeting these conditions. Showing future appointments within next 90 days and meeting all other requirements I discussed the assessment and treatment plan with the patient. The patient was provided an opportunity to ask questions and all were answered. The patient agreed with the plan and demonstrated an understanding of the instructions.  Patient advised to call back  or seek an in-person evaluation if the symptoms or condition worsens.  Duration of encounter: 30minutes.  Note by:  , MD Date: 11/15/2020; Time: 8:44 AM  

## 2020-11-15 NOTE — Progress Notes (Signed)
Nursing Pain Medication Assessment:  Safety precautions to be maintained throughout the outpatient stay will include: orient to surroundings, keep bed in low position, maintain call bell within reach at all times, provide assistance with transfer out of bed and ambulation.  Medication Inspection Compliance: Pill count conducted under aseptic conditions, in front of the patient. Neither the pills nor the bottle was removed from the patient's sight at any time. Once count was completed pills were immediately returned to the patient in their original bottle.  Medication: Hydrocodone/APAP Pill/Patch Count:  96 of 180 pills remain Pill/Patch Appearance: Markings consistent with prescribed medication Bottle Appearance: Standard pharmacy container. Clearly labeled. Filled Date: 06 / 06 / 2022 Last Medication intake:  Today

## 2020-11-17 ENCOUNTER — Other Ambulatory Visit: Payer: Self-pay

## 2020-11-17 ENCOUNTER — Ambulatory Visit (INDEPENDENT_AMBULATORY_CARE_PROVIDER_SITE_OTHER): Payer: PPO | Admitting: Family Medicine

## 2020-11-17 ENCOUNTER — Encounter: Payer: Self-pay | Admitting: Family Medicine

## 2020-11-17 ENCOUNTER — Telehealth: Payer: Self-pay | Admitting: Family Medicine

## 2020-11-17 DIAGNOSIS — M19072 Primary osteoarthritis, left ankle and foot: Secondary | ICD-10-CM | POA: Diagnosis not present

## 2020-11-17 DIAGNOSIS — I10 Essential (primary) hypertension: Secondary | ICD-10-CM | POA: Diagnosis not present

## 2020-11-17 DIAGNOSIS — E785 Hyperlipidemia, unspecified: Secondary | ICD-10-CM

## 2020-11-17 NOTE — Telephone Encounter (Signed)
I called and LVM for the patient o call back.  Devarious Pavek,cma

## 2020-11-17 NOTE — Assessment & Plan Note (Signed)
Well-controlled on last check.  He will continue Crestor 40 mg once daily and fenofibrate 145 mg daily.

## 2020-11-17 NOTE — Telephone Encounter (Signed)
Patient returned a call back and I informed him that the provider had spoken to Dr. Holley Raring and that he does injections and he just needs to call their office and schedule and he understood.  Troyce Febo,cma

## 2020-11-17 NOTE — Assessment & Plan Note (Signed)
The patient has chronic pain related to this.  He is limited in his ability to use NSAIDs.  His chronic pain medication does not seem to help with this.  I wonder if he would benefit from a steroid injection in his ankle.  He requested that I check with his pain specialist to see if they do this.  I advised I would contact him when I hear back from Dr. Zollie Scale.

## 2020-11-17 NOTE — Patient Instructions (Signed)
Nice to see you. We will send a message to Dr Holley Raring to see if he does ankle injections.  If you start to notice low BPs or light headedness please let us know.

## 2020-11-17 NOTE — Telephone Encounter (Signed)
-----   Message from [name], MD sent at 11/17/2020 11:17 AM EDT ----- [place].  I am doing well.  I hope you are.  Yes I do do ankle injections, if [name] is interested just have him contact our clinic and we can get him scheduled. Thanks.  [place] ----- Message ----- From: Leone Haven, MD Sent: 11/17/2020   8:30 AM EDT To: [name], MD  Vickki Muff,   I hope everything is going well. I saw Mr Kennard for follow-up today and he is having issues with his chronic ankle pain. He saw Dr Sharol Given in [place] for surgery previously. I'm not sure whether or not he would be appropriate for an injection in his ankle though his other treatment options are somewhat limited by AKI associated with NSAID use previously. Prior to referring him back to Dr Sharol Given I wanted to check to see if you do ankle injections in case he does need one. Thanks.  Randall Hiss

## 2020-11-17 NOTE — Assessment & Plan Note (Signed)
Blood pressure much improved.  He will continue lisinopril-HCTZ 20-25 mg once daily and metoprolol 25 mg daily.

## 2020-11-17 NOTE — Telephone Encounter (Signed)
Please let the patient know I heard back from Dr. Holley Raring.  He does do ankle injections.  The patient could contact his office to schedule an appointment to discuss this.

## 2020-11-17 NOTE — Progress Notes (Signed)
Tommi Rumps, MD Phone: [phone]  James Hood is a 80 y.o. male who presents today for f/u.  HYPERTENSION Disease Monitoring Home BP Monitoring not checking Chest pain- no    Dyspnea- no Medications Compliance-  taking lisinopril/HCTZ, metoprolol. Lightheadedness-  no  Edema- no He has done well with the decreased dose of his lisinopril/HCTZ.  HYPERLIPIDEMIA Symptoms Chest pain on exertion:  no  Medications: Compliance- taking crestor, fenofibrate Right upper quadrant pain- no  Muscle aches- no  Left ankle pain: This is a chronic issue.  It hurts all the time.  It started hurting more yesterday without a specific cause.  No recent injury.  Notes it was 6-11/2008.  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  He rarely uses NSAIDs given issues with his kidneys.  He did take 4 ibuprofen 2 days ago with some benefit.    Social History   Tobacco Use  Smoking Status Every Day   Packs/day: 0.50   Years: 60.00   Pack years: 30.00   Types: Cigarettes  Smokeless Tobacco Never    Current Outpatient Medications on File Prior to Visit  Medication Sig Dispense Refill   aspirin EC 81 MG tablet Take 81 mg by mouth daily.     budesonide (ENTOCORT EC) 3 MG 24 hr capsule Take 9 mg by mouth daily.     [START ON 11/21/2020] buPROPion (WELLBUTRIN XL) 300 MG 24 hr tablet Take 1 tablet (300 mg total) by mouth daily. 90 tablet 0   Continuous Blood Gluc Sensor (FREESTYLE LIBRE 14 DAY SENSOR) MISC APPLY ONE SENSOR EVERY 14 DAYS TO MONITOR BLOOD GLUCOSE LEVELS. REMOVE OLD SENSOR BEFORE APPLYING NEW SENSOR 2 each 1   diphenoxylate-atropine (LOMOTIL) 2.5-0.025 MG tablet Take by mouth.     divalproex (DEPAKOTE ER) 500 MG 24 hr tablet Take 1 tablet by mouth 2 (two) times daily.     donepezil (ARICEPT) 10 MG tablet Take 10 mg by mouth at bedtime.     fenofibrate (TRICOR) 145 MG tablet TAKE ONE TABLET BY MOUTH DAILY 90 tablet 1    finasteride (PROSCAR) 5 MG tablet TAKE ONE TABLET BY MOUTH DAILY. 90 tablet 1   gabapentin (NEURONTIN) 600 MG tablet TAKE TWO TABLETS BY MOUTH THREE TIMES A DAY 180 tablet 1   [START ON 01/22/2021] HYDROcodone-acetaminophen (NORCO) 10-325 MG tablet Take 1 tablet by mouth every 4 (four) hours as needed for severe pain. Must last 30 days. 180 tablet 0   Hyoscyamine Sulfate SL 0.125 MG SUBL Place under the tongue.     JARDIANCE 25 MG TABS tablet TAKE ONE TABLET BY MOUTH DAILY 30 tablet 5   lisinopril-hydrochlorothiazide (ZESTORETIC) 20-25 MG tablet Take 1 tablet by mouth daily. 90 tablet 1   memantine (NAMENDA) 10 MG tablet Take 1 tablet by mouth daily.     metoprolol succinate (TOPROL-XL) 25 MG 24 hr tablet Take 25 mg by mouth daily.     Multiple Vitamins-Minerals (CENTRUM SILVER PO) Take 1 tablet by mouth daily.     naloxegol oxalate (MOVANTIK) 12.5 MG TABS tablet Take 1 tablet (12.5 mg total) by mouth daily. Do not break tablet. Take on empty stomach 1 hour before or 2 hours after a meal. 30 tablet 2   omega-3 acid ethyl esters (LOVAZA) 1 g capsule Take 2 capsules (2 g total) by mouth 2 (two) times daily. 360 capsule 3   omeprazole (PRILOSEC) 20 MG capsule Take 20  mg by mouth 2 (two) times daily before a meal.      primidone (MYSOLINE) 50 MG tablet Take 150-250 mg by mouth 2 (two) times daily. Take 250 mg by mouth in the morning & take 150 mg at night.     QUEtiapine (SEROQUEL) 100 MG tablet Take 150 mg by mouth at bedtime.     rosuvastatin (CRESTOR) 40 MG tablet TAKE ONE TABLET BY MOUTH DAILY. 90 tablet 0   Semaglutide,0.25 or 0.5MG /DOS, (OZEMPIC, 0.25 OR 0.5 MG/DOSE,) 2 MG/1.5ML SOPN Inject 0.5 mg into the skin once a week. 1.5 mL 3   tamsulosin (FLOMAX) 0.4 MG CAPS capsule TAKE ONE CAPSULE BY MOUTH DAILY AFTER DINNER 90 capsule 0   [START ON 11/21/2020] traZODone (DESYREL) 100 MG tablet 100-150 mg at night as needed for sleep 135 tablet 0   buPROPion (WELLBUTRIN SR) 150 MG 12 hr tablet Take 150 mg  by mouth 2 (two) times daily. (Patient not taking: Reported on 11/17/2020)     [START ON 11/23/2020] HYDROcodone-acetaminophen (NORCO) 10-325 MG tablet Take 1 tablet by mouth every 4 (four) hours as needed for severe pain. Must last 30 days. (Patient not taking: Reported on 11/17/2020) 180 tablet 0   [START ON 12/23/2020] HYDROcodone-acetaminophen (NORCO) 10-325 MG tablet Take 1 tablet by mouth every 4 (four) hours as needed for severe pain. Must last 30 days. (Patient not taking: Reported on 11/17/2020) 180 tablet 0   memantine (NAMENDA) 10 MG tablet TAKE ONE TABLET BY MOUTH TWICE A DAY (Patient not taking: Reported on 11/17/2020)     mirabegron ER (MYRBETRIQ) 50 MG TB24 tablet Take 1 tablet (50 mg total) by mouth daily. (Patient not taking: Reported on 11/17/2020) 30 tablet 11   No current facility-administered medications on file prior to visit.     ROS see history of present illness  Objective  Physical Exam Vitals:   11/17/20 0803  BP: 115/60  Pulse: 70  Temp: 98.2 F (36.8 C)  SpO2: 97%    BP Readings from Last 3 Encounters:  11/17/20 115/60  11/15/20 99/66  10/11/20 120/80   Wt Readings from Last 3 Encounters:  11/17/20 163 lb 9.6 oz (74.2 kg)  11/15/20 166 lb (75.3 kg)  10/11/20 165 lb 9.6 oz (75.1 kg)    Physical Exam Constitutional:      General: He is not in acute distress.    Appearance: He is not diaphoretic.  Cardiovascular:     Rate and Rhythm: Normal rate and regular rhythm.     Heart sounds: Normal heart sounds.  Pulmonary:     Effort: Pulmonary effort is normal.     Breath sounds: Normal breath sounds.  Musculoskeletal:     Right lower leg: No edema.     Left lower leg: No edema.     Comments: Left ankle with no tenderness, warmth, swelling, or erythema, good range of motion at the left ankle, 2+ DP and PT pulses  Skin:    General: Skin is warm and dry.  Neurological:     Mental Status: He is alert.     Assessment/Plan: Please see individual problem  list.  Problem List Items Addressed This Visit     HLD (hyperlipidemia)    Well-controlled on last check.  He will continue Crestor 40 mg once daily and fenofibrate 145 mg daily.       Hypertension    Blood pressure much improved.  He will continue lisinopril-HCTZ 20-25 mg once daily and metoprolol 25 mg daily.  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  He is limited in his ability to use NSAIDs.  His chronic pain medication does not seem to help with this.  I wonder if he would benefit from a steroid injection in his ankle.  He requested that I check with his pain specialist to see if they do this.  I advised I would contact him when I hear back from Dr. Zollie Scale.       Return in about 3 months (around 02/17/2021).  This visit occurred during the SARS-CoV-2 public health emergency.  Safety protocols were in place, including screening questions prior to the visit, additional usage of staff PPE, and extensive cleaning of exam room while observing appropriate contact time as indicated for disinfecting solutions.    Tommi Rumps, MD [place]

## 2020-11-18 ENCOUNTER — Telehealth: Payer: Self-pay | Admitting: Student in an Organized Health Care Education/Training Program

## 2020-11-18 NOTE — Telephone Encounter (Signed)
Patient lvmail stating he wants to set up appt for injection in left ankle asap. I looked in chart dont have any orders for this. Patient was in to see Dr. Holley Raring 11-15-20. Please advise patient if Dr. Holley Raring will order this. Thank you

## 2020-11-22 ENCOUNTER — Ambulatory Visit (INDEPENDENT_AMBULATORY_CARE_PROVIDER_SITE_OTHER): Payer: PPO | Admitting: Pharmacist

## 2020-11-22 ENCOUNTER — Other Ambulatory Visit: Payer: Self-pay | Admitting: Psychiatry

## 2020-11-22 ENCOUNTER — Other Ambulatory Visit: Payer: Self-pay

## 2020-11-22 ENCOUNTER — Other Ambulatory Visit: Payer: Self-pay | Admitting: Student in an Organized Health Care Education/Training Program

## 2020-11-22 DIAGNOSIS — G8929 Other chronic pain: Secondary | ICD-10-CM

## 2020-11-22 DIAGNOSIS — M19072 Primary osteoarthritis, left ankle and foot: Secondary | ICD-10-CM

## 2020-11-22 DIAGNOSIS — F32A Depression, unspecified: Secondary | ICD-10-CM

## 2020-11-22 DIAGNOSIS — F419 Anxiety disorder, unspecified: Secondary | ICD-10-CM | POA: Diagnosis not present

## 2020-11-22 DIAGNOSIS — E782 Mixed hyperlipidemia: Secondary | ICD-10-CM | POA: Diagnosis not present

## 2020-11-22 DIAGNOSIS — N3281 Overactive bladder: Secondary | ICD-10-CM

## 2020-11-22 DIAGNOSIS — M545 Low back pain, unspecified: Secondary | ICD-10-CM

## 2020-11-22 DIAGNOSIS — K529 Noninfective gastroenteritis and colitis, unspecified: Secondary | ICD-10-CM

## 2020-11-22 DIAGNOSIS — I10 Essential (primary) hypertension: Secondary | ICD-10-CM

## 2020-11-22 DIAGNOSIS — E1142 Type 2 diabetes mellitus with diabetic polyneuropathy: Secondary | ICD-10-CM | POA: Diagnosis not present

## 2020-11-22 DIAGNOSIS — N1831 Chronic kidney disease, stage 3a: Secondary | ICD-10-CM

## 2020-11-22 DIAGNOSIS — K22719 Barrett's esophagus with dysplasia, unspecified: Secondary | ICD-10-CM

## 2020-11-22 MED ORDER — BUPROPION HCL ER (XL) 300 MG PO TB24: 300.0000 mg | ORAL_TABLET | Freq: Every day | ORAL | 0 refills | Status: DC

## 2020-11-22 NOTE — Telephone Encounter (Signed)
I have messaged Dr. Holley Raring regarding this and will wait for his response, then contact patient.

## 2020-11-22 NOTE — Patient Instructions (Addendum)
James Hood,   Keep up the great work!  Pay attention to your post-meal glucose trends. If they are trending higher than the goal range, think back to the meal choice you made and the carbohydrate content.   Continue your current regimen at this time. When you finish the prescription Lovaza (omega 3 fatty acids), you can switch to over the counter. Look for options that are higher in "EPA" content versus "DHA". A really good brand is [employer], but that isn't always the cheapest. I would like for you to target 1200-2000 mg twice daily.   Take care!  Catie Darnelle Maffucci, PharmD [phone]  Visit Information  PATIENT GOALS:  Goals Addressed               This Visit's Progress     Patient Stated     Medication Monitoring (pt-stated)        Patient Goals/Self-Care Activities Over the next 90 days, patient will:  - take medications as prescribed check glucose at least 3 times daily using CGM, document, and provide at future appointments collaborate with provider on medication access solutions engage in dietary modifications by reducing carbohydrate portion sizes Focus on tobacco cessation         Print copy of patient instructions, educational materials, and care plan provided in person.  Plan: Telephone follow up appointment with care management team member scheduled for:  ~ 12 weeks  Catie Darnelle Maffucci, PharmD, [place], [place] Clinical Pharmacist [employer] [facility] [employer] [phone]

## 2020-11-22 NOTE — Chronic Care Management (AMB) (Signed)
Chronic Care Management Pharmacy Note  11/22/2020 Name:  James Hood MRN:  [id number] DOB:  1941-03-13   Subjective: James Hood is an 80 y.o. year old male who is a primary patient of Caryl Bis, Angela Adam, MD.  The CCM team was consulted for assistance with disease management and care coordination needs.    Engaged with patient face to face for follow up visit in response to provider referral for pharmacy case management and/or care coordination services.   Consent to Services:  The patient was given information about Chronic Care Management services, agreed to services, and gave verbal consent prior to initiation of services.  Please see initial visit note for detailed documentation.   Patient Care Team: Leone Haven, MD as PCP - General (Family Medicine) De Hollingshead, [place] as Pharmacist (Pharmacist)  Recent office visits: 6/30 - PCP - BP f/u, at goal. Encouraged to discuss ankle pain with pain management - collaborating with pain management on this  Recent consult visits: 6/27 - [facility] psych - continue current regimen 6/28 - [facility] pain management - continue current regimen  Hospital visits: None in previous 6 months  Objective:  Lab Results  Component Value Date   CREATININE 1.11 10/11/2020   CREATININE 1.47 09/08/2020   CREATININE 1.21 07/20/2020    Lab Results  Component Value Date   HGBA1C 7.1 (H) 09/08/2020   Last diabetic Eye exam:  Lab Results  Component Value Date/Time   HMDIABEYEEXA No Retinopathy 01/27/2020 03:37 PM    Last diabetic Foot exam: No results found for: HMDIABFOOTEX      Component Value Date/Time   CHOL 99 07/05/2020 1209   TRIG 226.0 (H) 07/05/2020 1209   HDL 43.20 07/05/2020 1209   CHOLHDL 2 07/05/2020 1209   VLDL 45.2 (H) 07/05/2020 1209   LDLCALC 57 09/25/2017 0924   LDLDIRECT 31.0 07/05/2020 1209    Hepatic Function Latest Ref Rng & Units 10/11/2020 06/08/2020 05/31/2020  Total Protein 6.0 - 8.3 g/dL  6.2 6.9 5.8(L)  Albumin 3.5 - 5.2 g/dL 4.1 3.3(L) 2.8(L)  AST 0 - 37 U/L _0 ALT 0 - 53 U/L _1 Alk Phosphatase 39 - 117 U/L 41 58 32(L)  Total Bilirubin 0.2 - 1.2 mg/dL 0.3 0.8 0.7  Bilirubin, Direct 0.0 - 0.3 mg/dL - - -    Lab Results  Component Value Date/Time   TSH 0.97 10/11/2020 10:08 AM   TSH 0.346 (L) 05/31/2020 12:34 AM   TSH 1.36 07/20/2019 09:02 AM    CBC Latest Ref Rng & Units 10/11/2020 06/08/2020 06/01/2020  WBC 4.0 - 10.5 K/uL 5.8 6.5 6.7  Hemoglobin 13.0 - 17.0 g/dL 12.6(L) 11.8(L) 10.2(L)  Hematocrit 39.0 - 52.0 % 37.5(L) 35.7(L) 31.0(L)  Platelets 150.0 - 400.0 K/uL 249.0 375 203    Lab Results  Component Value Date/Time   VD25OH 24.57 (L) 10/11/2020 10:08 AM   VD25OH 42.83 04/07/2019 08:54 AM    Clinical ASCVD: No  The ASCVD Risk score Mikey Bussing DC Jr., et al., 2013) failed to calculate for the following reasons:   The 2013 ASCVD risk score is only valid for ages 19 to 89      Social History   Tobacco Use  Smoking Status Every Day   Packs/day: 0.50   Years: 60.00   Pack years: 30.00   Types: Cigarettes  Smokeless Tobacco Never   BP Readings from Last 3 Encounters:  11/17/20 115/60  11/15/20 99/66  11/14/20 100/61  Pulse Readings from Last 3 Encounters:  11/17/20 70  11/15/20 65  11/14/20 80   Wt Readings from Last 3 Encounters:  11/17/20 163 lb 9.6 oz (74.2 kg)  11/15/20 166 lb (75.3 kg)  11/14/20 166 lb 3.2 oz (75.4 kg)    Assessment: Review of patient past medical history, allergies, medications, health status, including review of consultants reports, laboratory and other test data, was performed as part of comprehensive evaluation and provision of chronic care management services.   SDOH:  (Social Determinants of Health) assessments and interventions performed:  SDOH Interventions    Flowsheet Row Most Recent Value  SDOH Interventions   Financial Strain Interventions Other (Comment)  [manufacturer assistance]        CCM Care Plan  No Known Allergies  Medications Reviewed Today     Reviewed by De Hollingshead, RPH-CPP (Pharmacist) on 11/22/20 at 1411  Med List Status: <None>   Medication Order Taking? Sig Documenting Provider Last Dose Status Informant  aspirin EC 81 MG tablet [id number] Yes Take 81 mg by mouth daily. [provider] Taking Active            Med Note Vanessa [place], SUSAN   Tue Apr 15, 2018  8:47 AM)    budesonide (ENTOCORT EC) 3 MG 24 hr capsule [id number] Yes Take 6 mg by mouth daily. [provider] Taking Active   buPROPion (WELLBUTRIN XL) 300 MG 24 hr tablet [id number] Yes Take 1 tablet (300 mg total) by mouth daily. Norman Clay, MD Taking Active   Continuous Blood Gluc Sensor (FREESTYLE LIBRE 14 DAY SENSOR) MISC [id number]  APPLY ONE SENSOR EVERY 14 DAYS TO MONITOR BLOOD GLUCOSE LEVELS. [place] Leone Haven, MD  Active   diphenoxylate-atropine (LOMOTIL) 2.5-0.025 MG tablet [id number] Yes Take 1 tablet by mouth in the morning and at bedtime. [provider] Taking Active   divalproex (DEPAKOTE ER) 500 MG 24 hr tablet [id number] Yes Take 1 tablet by mouth 2 (two) times daily. [provider] Taking Active   donepezil (ARICEPT) 10 MG tablet [id number] Yes Take 10 mg by mouth at bedtime. [provider] Taking Active Self  fenofibrate (TRICOR) 145 MG tablet [id number] Yes TAKE ONE TABLET BY MOUTH DAILY Leone Haven, MD Taking Active   finasteride (PROSCAR) 5 MG tablet [id number] Yes TAKE ONE TABLET BY MOUTH DAILY. Leone Haven, MD Taking Active   gabapentin (NEURONTIN) 600 MG tablet [id number] Yes TAKE TWO TABLETS BY MOUTH THREE TIMES A DAY Leone Haven, MD Taking Active   HYDROcodone-acetaminophen [facility]) 10-325 MG tablet [id number] Yes Take 1 tablet by mouth every 4 (four) hours as needed for severe pain. Must last 30 days. [name], MD Taking Active   HYDROcodone-acetaminophen  [facility]) 10-325 MG tablet [id number]  Take 1 tablet by mouth every 4 (four) hours as needed for severe pain. Must last 30 days.  Patient not taking: Reported on 11/17/2020   [name], MD  Active   HYDROcodone-acetaminophen [facility]) 10-325 MG tablet [id number]  Take 1 tablet by mouth every 4 (four) hours as needed for severe pain. Must last 30 days. [name], MD  Active   Hyoscyamine Sulfate SL 0.125 MG SUBL [id number] No Place under the tongue.  Patient not taking: Reported on 11/22/2020   [provider] Not Taking Active   JARDIANCE 25 MG TABS tablet [id number] Yes TAKE ONE TABLET BY MOUTH DAILY Leone Haven, MD Taking Active   lisinopril-hydrochlorothiazide (ZESTORETIC) 20-25 MG  tablet [id number] Yes Take 1 tablet by mouth daily. Leone Haven, MD Taking Active   memantine [facility]) 10 MG tablet [id number] Yes Take 1 tablet by mouth daily. [provider] Taking Active   metoprolol succinate (TOPROL-XL) 25 MG 24 hr tablet [id number] Yes Take 25 mg by mouth daily. [provider] Taking Active   mirabegron ER (MYRBETRIQ) 50 MG TB24 tablet [id number] No Take 1 tablet (50 mg total) by mouth daily.  Patient not taking: No sig reported   Billey Co, MD Not Taking Active   Multiple Vitamins-Minerals (CENTRUM SILVER PO) [id number]  Take 1 tablet by mouth daily. [provider]  Active   naloxegol oxalate (MOVANTIK) 12.5 MG TABS tablet [id number] Yes Take 1 tablet (12.5 mg total) by mouth daily. Do not break tablet. Take on empty stomach 1 hour before or 2 hours after a meal. [name], MD Taking Active   omega-3 acid ethyl esters (LOVAZA) 1 g capsule [id number] Yes Take 2 capsules (2 g total) by mouth 2 (two) times daily. Leone Haven, MD Taking Active   omeprazole (PRILOSEC) 20 MG capsule [id number] Yes Take 20 mg by mouth 2 (two) times daily before a meal.  [provider] Taking Active Self           Med Note Josiah Lobo, KIMBERLY   Thu  Aug 09, 2016 12:05 PM)    primidone (MYSOLINE) 50 MG tablet [id number] Yes Take 150-250 mg by mouth 2 (two) times daily. Take 250 mg by mouth in the morning & take 150 mg at night. [provider] Taking Active Self  QUEtiapine (SEROQUEL) 100 MG tablet [id number] Yes Take 150 mg by mouth at bedtime. [provider] Taking Active Pharmacy Records  rosuvastatin (CRESTOR) 40 MG tablet [id number] Yes TAKE ONE TABLET BY MOUTH DAILY. Leone Haven, MD Taking Active   Semaglutide,0.25 or 0.5MG/DOS, (OZEMPIC, 0.25 OR 0.5 MG/DOSE,) 2 MG/1.5ML SOPN [id number] Yes Inject 0.5 mg into the skin once a week. Leone Haven, MD Taking Active Pharmacy Records  tamsulosin [facility]) 0.4 MG CAPS capsule [id number] Yes TAKE ONE CAPSULE BY MOUTH DAILY AFTER Shawna Clamp, Randell Patient, MD Taking Active   traZODone (DESYREL) 100 MG tablet [id number] Yes 100-150 mg at night as needed for sleep Norman Clay, MD Taking Active   Med List Note [name], RN 11/15/20 4854): MR 02/21/2021 UDS 11/15/2020            Patient Active Problem List   Diagnosis Date Noted   Chronic, continuous use of opioids 11/15/2020   Therapeutic opioid-induced constipation (OIC) 11/15/2020   Fatigue 10/11/2020   OAB (overactive bladder) 08/08/2020   AKI (acute kidney injury) ([place]) 08/08/2020   Leg cramps 08/08/2020   Prolonged Q-T interval on ECG 06/08/2020   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   Right leg pain 02/05/2020   Impingement of left ankle joint    Sleeping difficulty 08/24/2019   Hiccups 08/24/2019   Chronic pain of left ankle 02/27/2019   Vitamin D deficiency 02/18/2019   Left shoulder pain 02/02/2019   Neuropathy 09/24/2018   Lumbosacral radiculopathy 01/14/2018   Primary osteoarthritis of left ankle 01/14/2018   Facet arthritis of lumbar region 01/14/2018   Chronic pain syndrome 08/26/2017   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   Thyroid nodule 02/27/2017   Fatty liver 02/27/2017   Purpura ([place])  01/29/2017   Right hip pain 08/16/2016   Lumbar herniated disc 02/03/2016   Hypertriglyceridemia 12/09/2015   Anemia 12/09/2015   Falls 09/16/2015  Barrett's esophagus 09/16/2015   Chronic lumbar pain 09/16/2015   Benign prostatic hyperplasia 09/16/2015   Trochanteric bursitis of left hip 09/09/2015   Headache 09/09/2015   Difficulty in walking 06/01/2015   Amnesia 06/01/2015   HLD (hyperlipidemia) 02/23/2015   DJD (degenerative joint disease) of knee 11/15/2014   Hypertension    Tremor, essential    Primary localized osteoarthritis of right knee    Anxiety and depression    Absence of sensation 11/18/2013   Tobacco abuse 11/18/2013   Chronic diarrhea 11/12/2013   Benign neoplasm of colon 08/25/2013   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   Type 2 diabetes mellitus ([place]) 10/06/2012    Immunization History  Administered Date(s) Administered   Fluad Quad(high Dose 65+) 02/05/2020   Hepatitis A, Adult 12/02/2018   Influenza, High Dose Seasonal PF 01/29/2017, 03/19/2018, 01/10/2019   Influenza,inj,Quad PF,6+ Mos 03/13/2016   Influenza-Unspecified 01/21/2015   PFIZER(Purple Top)SARS-COV-2 Vaccination 06/01/2019, 06/22/2019   Pneumococcal Conjugate-13 02/07/2015   Pneumococcal Polysaccharide-23 01/18/2018   Tdap 09/16/2015, 01/18/2018   Zoster Recombinat (Shingrix) 01/03/2018, 05/09/2018   Zoster, Live 09/16/2015    Conditions to be addressed/monitored: HTN, HLD, DMII, and Depression  Care Plan : Medication Management  Updates made by De Hollingshead, RPH-CPP since 11/22/2020 12:00 AM     Problem: Diabetes, Depression      Long-Range Goal: Disease Progression Prevention   Start Date: 02/16/2020  This Visit's Progress: On track  Recent Progress: On track  Priority: Medium  Note:   Current Barriers:  Unable to independently afford treatment regimen  Pharmacist Clinical Goal(s):  Over the next 30 days, patient will verbalize ability to afford treatment regimen  through collaboration with PharmD and provider.  Over the next 90 days, patient will maintain glycemic control as evidenced by A1c through collaboration with PharmD and provider.  Interventions: 1:1 collaboration with Leone Haven, MD regarding development and update of comprehensive plan of care as evidenced by provider attestation and co-signature Inter-disciplinary care team collaboration (see longitudinal plan of care) Comprehensive medication review performed; medication list updated in electronic medical record  Health Maintenance: Due for foot exam. Placed in appointment notes for next provider visit.   SDOH: Will revisit tobacco use at next appointment Does report cost concerns with medications.   Diabetes: Controlled per more relaxed goal of A1c <7.5%; current treatment: Jardiance 25 mg daily, Ozempic 0.5 mg weekly (max tolerated dose d/t GI upset/weight loss at 1 mg weekly) Approved for Ozempic assistance. Over income for [place] assistance.  Reports [facility] nurse reported POCT A1c of 6.2% a few weeks ago. Hx metformin ER - significant diarrhea that resolved upon discontinuation Current glucose readings: using Libre 2 CGM Date of Download: 6/22-7/5 % Time CGM is active: 56% Average Glucose: 140 mg/dL Glucose Management Indicator: 6.7% Glucose Variability: 26.5 (goal <36%) Time in Goal:  - Time in range 70-180: 85% - Time above range: 15% - Time below range: 0% Observed patterns: occasional post prandial elevations, but not consistently Discussed meal choices. He reports that some days, he will have a no-carb breakfast that includes sausage, eggs, tomato. These correlate with better post-prandial readings than the days that he has options such as waffles, grits. Reviewed to keep using CGM consistently to evaluate glycemic impact of dietary choices.  Discussed that next step would likely be glipizide, but discussed risk of hypoglycemia. Continue current regimen at  this time  Hypertension: Controlled per last clinic reading; current treatment: lisinopril/HCTZ 20/25 mg daily, metoprolol succinate 25 mg daily Home  BP readings: not checking at this time Advised to resume occasional home BP checks to ensure no hypotensive risks. Could reduce dose of HCTZ if needed moving forward. Continue current regimen at this time  Hyperlipidemia: Controlled; current treatment: rosuvastatin 40 mg daily, fenofibrate 145 mg daily, generic Lovaza 2 g BID Reports that generic Lovaza is still expensive - Tier 2 on [employer] insurance. Previously using [employer], but grant is closed for now.  Continue current regimen, but OK to switch to OTC fish oil option due to medication costs. Discussed choosing options with greater content of EPA vs DHA due to association with CV risk reduction. Target 1.5-2 g BID dose. Patient verbalizes understanding.   Depression/insomnia: Improved. Current regimen: bupropion XL 300 mg daily, trazodone 100-150 mg QPM. Follows w/ Dr. Modesta Messing and LCSW They had previously discussed reducing dose of quetiapine to reduce risk of EPS. Patient declined.  Recommended to continue current collaboration with psychiatry.   Chronic Pain w/ recent ankle surgery: Improved per patient report; hydrocodone/APAP 10/325 mg up to TID, gabapentin 1200 mg TID. Follows w/ Dr. Holley Raring On Movantik 12.5 mg daily for opioid induced constipation. Reports now that he is having to manually disimpact himself.  See recommendations for reduction of diarrhea tx as below. Encouraged to continue to discuss balance of constipation vs prevention of episodes of fecal urgency/incontinence w/ pain management Reviewed Movantik. No available patient assistance programs.   Neurologic Conditions (pseudo dementia, tremor, sleep disorder) Well managed per patient report; current regimen: memantine 10 mg BID, donepezil 10 mg daily; primidone 250 mg QAM, 150 mg QPM; divalproex  500 mg BID, quetiapine 150 mg QPM; Follows w/ Dr. Melrose Nakayama.  Recommended to continue current regimen at this time along with collaboration w/ Neurology  Chronic Diarrhea/Colitis, Barrett's: Overly controlled per patient report; current regimen: budesonide 6 mg daily, Lomotil 1 tab QAM- taking BID currently, hyoscyamine PRN added at last visit; follows w/ [facility] Barrett's: omeprazole 20 mg BID Discussed to try reducing Lomotil to 1 tab QAM as noted on last GI AVS rather than BID to see if improvement in constipation. Continue to collaborate with GI, Pain Management to balance chronic diarrhea/colitis and opioid induced constipation.  EGD upcoming  OAB/BPH: Improved; current treatment regimen: Myrbetriq 50 mg daily, tamsulosin 0.4 mg daily; follows w/ Dr. Diamantina Providence Recommended to continue current regimen at this time    Patient Goals/Self-Care Activities Over the next 90 days, patient will:  - take medications as prescribed check glucose at least 3 times daily using CGM, document, and provide at future appointments collaborate with provider on medication access solutions engage in dietary modifications by reducing carbohydrate portion sizes Focus on tobacco cessation  Follow Up Plan: Telephone follow up appointment with care management team member scheduled for: ~12 weeks     Medication Assistance:  Ozempic obtained through [employer] medication assistance program.  Enrollment ends 05/20/21. Over income for [place] assistance. [facility] closed at this time.  Patient's preferred pharmacy is:  [facility] [name], [place] [address] Phone: [phone] Fax: [phone]  [name], [place] - [address] [place] [address] Phone: [phone] Fax: [phone]  [facility] [address] [place] - [place] [address] Phone: [phone] Fax: [phone]  [facility]cription [place], [place]  [address] [place] [address] Phone: [phone] Fax: [phone]   Follow Up:  Patient agrees to Care Plan and Follow-up.  Plan: Telephone follow up appointment with care management team member scheduled for:  ~ 12 weeks  Catie Darnelle Maffucci, PharmD, [place], [place] Clinical Pharmacist [employer] at [employer] [phone]

## 2020-11-23 ENCOUNTER — Encounter: Payer: Self-pay | Admitting: Urology

## 2020-11-23 ENCOUNTER — Encounter: Payer: Self-pay | Admitting: Student in an Organized Health Care Education/Training Program

## 2020-11-23 ENCOUNTER — Ambulatory Visit
Payer: PPO | Attending: Student in an Organized Health Care Education/Training Program | Admitting: Student in an Organized Health Care Education/Training Program

## 2020-11-23 ENCOUNTER — Ambulatory Visit: Payer: PPO | Admitting: Urology

## 2020-11-23 VITALS — BP 110/70 | HR 64 | Ht 70.0 in | Wt 164.0 lb

## 2020-11-23 VITALS — BP 114/55 | HR 81 | Temp 97.2°F | Resp 18 | Ht 70.0 in | Wt 164.0 lb

## 2020-11-23 DIAGNOSIS — N3281 Overactive bladder: Secondary | ICD-10-CM

## 2020-11-23 DIAGNOSIS — N138 Other obstructive and reflux uropathy: Secondary | ICD-10-CM

## 2020-11-23 DIAGNOSIS — G894 Chronic pain syndrome: Secondary | ICD-10-CM | POA: Diagnosis not present

## 2020-11-23 DIAGNOSIS — M19072 Primary osteoarthritis, left ankle and foot: Secondary | ICD-10-CM

## 2020-11-23 DIAGNOSIS — N401 Enlarged prostate with lower urinary tract symptoms: Secondary | ICD-10-CM | POA: Diagnosis not present

## 2020-11-23 LAB — BLADDER SCAN AMB NON-IMAGING

## 2020-11-23 LAB — TOXASSURE SELECT 13 (MW), URINE

## 2020-11-23 MED ORDER — GEMTESA 75 MG PO TABS: 1.0000 | ORAL_TABLET | Freq: Every day | ORAL | 11 refills | Status: DC

## 2020-11-23 MED ORDER — ROPIVACAINE HCL 2 MG/ML IJ SOLN
INTRAMUSCULAR | Status: AC
  Filled 2020-11-23: qty 20

## 2020-11-23 MED ORDER — DEXAMETHASONE SODIUM PHOSPHATE 10 MG/ML IJ SOLN
INTRAMUSCULAR | Status: AC
  Filled 2020-11-23: qty 1

## 2020-11-23 MED ORDER — DEXAMETHASONE SODIUM PHOSPHATE 10 MG/ML IJ SOLN
10.0000 mg | Freq: Once | INTRAMUSCULAR | Status: AC
  Administered 2020-11-23: 10 mg

## 2020-11-23 MED ORDER — LIDOCAINE HCL 2 % IJ SOLN
INTRAMUSCULAR | Status: AC
  Filled 2020-11-23: qty 10

## 2020-11-23 MED ORDER — LIDOCAINE HCL 2 % IJ SOLN
20.0000 mL | Freq: Once | INTRAMUSCULAR | Status: AC
  Administered 2020-11-23: 200 mg

## 2020-11-23 NOTE — Progress Notes (Signed)
PROVIDER NOTE: Information contained herein reflects review and annotations entered in association with encounter. Interpretation of such information and data should be left to medically-trained personnel. Information provided to patient can be located elsewhere in the medical record under "Patient Instructions". Document created using STT-dictation technology, any transcriptional errors that may result from process are unintentional.    Patient: James Hood  Service Category: Procedure  Provider: [name], MD  DOB: [birth year]  DOS: 11/23/2020  Location: [facility]  MRN: [id number]  Setting: Ambulatory - outpatient  Referring Provider: Leone Haven, MD  Type: Established Patient  Specialty: Interventional Pain Management  PCP: Leone Haven, MD   Primary Reason for Visit: Interventional Pain Management Treatment. CC: Ankle Pain  Procedure:          Anesthesia, Analgesia, Anxiolysis:  Type: Diagnostic True Ankle Steroid Injection                 Region: Dorsal             Target Area: Transverse tarsal joint Level: Ankle Laterality: Left  Type: Local Anesthesia Indication(s): Analgesia         Local Anesthetic: Lidocaine 1-2% Route: Infiltration ([place]/IM) IV Access: Declined Sedation: Declined   Position: Supine   Indications: 1. Primary osteoarthritis of left ankle   2. Chronic pain syndrome    Pain Score: Pre-procedure: 5 /10 Post-procedure: 0-No pain (standing and walking)/10   Pre-op H&P Assessment:  James Hood is a 80 y.o. (year old), male patient, seen today for interventional treatment. He  has a past surgical history that includes esophageal stretch; Tonsillectomy; Total knee arthroplasty (Right, 11/15/2014); Lumbar laminectomy/decompression microdiscectomy (Left, 02/03/2016); Colonoscopy with propofol (N/A, 09/14/2016); Joint replacement (Right, 2016); Pulse generator implant (N/A, 08/21/2017); Back surgery; Pulse generator implant (Right,  04/02/2018); and Ankle arthroscopy (Left, 09/29/2019). James Hood has a current medication list which includes the following prescription(s): aspirin ec, budesonide, bupropion, freestyle libre 14 day sensor, diphenoxylate-atropine, divalproex, donepezil, fenofibrate, gabapentin, hydrocodone-acetaminophen, [START ON 12/23/2020] hydrocodone-acetaminophen, [START ON 01/22/2021] hydrocodone-acetaminophen, hyoscyamine sulfate sl, jardiance, lisinopril-hydrochlorothiazide, memantine, metoprolol succinate, mirabegron er, multiple vitamins-minerals, naloxegol oxalate, omega-3 acid ethyl esters, omeprazole, primidone, quetiapine, rosuvastatin, ozempic (0.25 or 0.5 mg/dose), tamsulosin, trazodone, and gemtesa. His primarily concern today is the Ankle Pain  Initial Vital Signs:  Pulse/HCG Rate: 81  Temp: (!) 97.2 F (36.2 C) Resp: 18 BP: (!) 114/55 SpO2: 97 %  BMI: Estimated body mass index is 23.53 kg/m as calculated from the following:   Height as of this encounter: 5\' 10"  (1.778 m).   Weight as of this encounter: 164 lb (74.4 kg).  Risk Assessment: Allergies: Reviewed. He has No Known Allergies.  Allergy Precautions: None required Coagulopathies: Reviewed. None identified.  Blood-thinner therapy: None at this time Active Infection(s): Reviewed. None identified. James Hood is afebrile  Site Confirmation: James Hood was asked to confirm the procedure and laterality before marking the site Procedure checklist: Completed Consent: Before the procedure and under the influence of no sedative(s), amnesic(s), or anxiolytics, the patient was informed of the treatment options, risks and possible complications. To fulfill our ethical and legal obligations, as recommended by the [facility] [employer]'s Code of Ethics, I have informed the patient of my clinical impression; the nature and purpose of the treatment or procedure; the risks, benefits, and possible complications of the intervention; the  alternatives, including doing nothing; the risk(s) and benefit(s) of the alternative treatment(s) or procedure(s); and the risk(s) and benefit(s) of doing nothing. The patient  was provided information about the general risks and possible complications associated with the procedure. These may include, but are not limited to: failure to achieve desired goals, infection, bleeding, organ or nerve damage, allergic reactions, paralysis, and death. In addition, the patient was informed of those risks and complications associated to the procedure, such as failure to decrease pain; infection; bleeding; organ or nerve damage with subsequent damage to sensory, motor, and/or autonomic systems, resulting in permanent pain, numbness, and/or weakness of one or several areas of the body; allergic reactions; (i.e.: anaphylactic reaction); and/or death. Furthermore, the patient was informed of those risks and complications associated with the medications. These include, but are not limited to: allergic reactions (i.e.: anaphylactic or anaphylactoid reaction(s)); adrenal axis suppression; blood sugar elevation that in diabetics may result in ketoacidosis or comma; water retention that in patients with history of congestive heart failure may result in shortness of breath, pulmonary edema, and decompensation with resultant heart failure; weight gain; swelling or edema; medication-induced neural toxicity; particulate matter embolism and blood vessel occlusion with resultant organ, and/or nervous system infarction; and/or aseptic necrosis of one or more joints. Finally, the patient was informed that Medicine is not an exact science; therefore, there is also the possibility of unforeseen or unpredictable risks and/or possible complications that may result in a catastrophic outcome. The patient indicated having understood very clearly. We have given the patient no guarantees and we have made no promises. Enough time was given to the  patient to ask questions, all of which were answered to the patient's satisfaction. James Hood has indicated that he wanted to continue with the procedure. Attestation: I, the ordering provider, attest that I have discussed with the patient the benefits, risks, side-effects, alternatives, likelihood of achieving goals, and potential problems during recovery for the procedure that I have provided informed consent. Date  Time: 11/23/2020  1:24 PM  Pre-Procedure Preparation:  Monitoring: As per clinic protocol. Respiration, ETCO2, SpO2, BP, heart rate and rhythm monitor placed and checked for adequate function Safety Precautions: Patient was assessed for positional comfort and pressure points before starting the procedure. Time-out: I initiated and conducted the "Time-out" before starting the procedure, as per protocol. The patient was asked to participate by confirming the accuracy of the "Time Out" information. Verification of the correct person, site, and procedure were performed and confirmed by me, the nursing staff, and the patient. "Time-out" conducted as per Joint Commission's Universal Protocol (UP.01.01.01). Time: 1349  Description of Procedure:          Approach: Dorsal approach. Area Prepped: Entire dorsal foot Region DuraPrep (Iodine Povacrylex [0.7% available iodine] and Isopropyl Alcohol, 74% w/w) Safety Precautions: Aspiration looking for blood return was conducted prior to all injections. At no point did we inject any substances, as a needle was being advanced. No attempts were made at seeking any paresthesias. Safe injection practices and needle disposal techniques used. Medications properly checked for expiration dates. SDV (single dose vial) medications used. Description of the Procedure: Protocol guidelines were followed. The patient was placed in position. The target area was identified and the area prepped in the usual manner. Skin & deeper tissues infiltrated with local  anesthetic. Appropriate amount of time allowed to pass for local anesthetics to take effect. The procedure needles were then advanced to the target area. Proper needle placement secured. Negative aspiration confirmed. Solution injected in intermittent fashion, asking for systemic symptoms every 0.5cc of injectate. The needles were then removed and the area cleansed, making sure to  leave some of the prepping solution back to take advantage of its long term bactericidal properties.                          Vitals:   11/23/20 1337  BP: (!) 114/55  Pulse: 81  Resp: 18  Temp: (!) 97.2 F (36.2 C)  SpO2: 97%  Weight: 164 lb (74.4 kg)  Height: 5\' 10"  (1.778 m)    Start Time: 1349 hrs. End Time: 1353 hrs.  Materials:  Needle(s) Type: Regular needle Gauge: 25G Length: 1.5-in Medication(s): Please see orders for medications and dosing details.  3 cc solution made of 2 cc of 0.2% ropivacaine, 1 cc of Decadron 10 mg/cc.  Injected into the left fifth transverse tarsal joint  Post-operative Assessment:  Post-procedure Vital Signs:  Pulse/HCG Rate: 81  Temp: (!) 97.2 F (36.2 C) Resp: 18 BP: (!) 114/55 SpO2: 97 %  EBL: None  Complications: No immediate post-treatment complications observed by team, or reported by patient.  Note: The patient tolerated the entire procedure well. A repeat set of vitals were taken after the procedure and the patient was kept under observation following institutional policy, for this type of procedure. Post-procedural neurological assessment was performed, showing return to baseline, prior to discharge. The patient was provided with post-procedure discharge instructions, including a section on how to identify potential problems. Should any problems arise concerning this procedure, the patient was given instructions to immediately contact us, at any time, without hesitation. In any case, we plan to contact the patient by telephone for a follow-up status  report regarding this interventional procedure.  Comments:  No additional relevant information.  Plan of Care  Orders:  No orders of the defined types were placed in this encounter.  Chronic Opioid Analgesic:  Hydrocodone 10 mg every 4 hours as needed, quantity 180/month; MME equals 60    Medications ordered for procedure: Meds ordered this encounter  Medications   dexamethasone (DECADRON) injection 10 mg   lidocaine (XYLOCAINE) 2 % (with pres) injection 400 mg   Medications administered: We administered dexamethasone and lidocaine.  See the medical record for exact dosing, route, and time of administration.  Follow-up plan:   No follow-ups on file.      Recent Visits Date Type Provider Dept  11/15/20 Office Visit [name], MD [facility]  Showing recent visits within past 90 days and meeting all other requirements Today's Visits Date Type Provider Dept  11/23/20 Procedure visit [name], MD [facility]  Showing today's visits and meeting all other requirements Future Appointments Date Type Provider Dept  12/07/20 Appointment [name], MD [facility]  02/02/21 Appointment [name], MD [facility]  Showing future appointments within next 90 days and meeting all other requirements Disposition: Discharge home  Discharge (Date  Time): 11/23/2020; 1357 hrs.   Primary Care Physician: Leone Haven, MD Location: [facility] Note by: [name], MD Date: 11/23/2020; Time: 2:22 PM  Disclaimer:  Medicine is not an exact science. The only guarantee in medicine is that nothing is guaranteed. It is important to note that the decision to proceed with this intervention was based on the information collected from the patient. The Data and conclusions were drawn from the patient's questionnaire, the interview, and the physical examination. Because the information was provided in large part by the  patient, it cannot be guaranteed that it has not been purposely or unconsciously  manipulated. Every effort has been made to obtain as much relevant data as possible for this evaluation. It is important to note that the conclusions that lead to this procedure are derived in large part from the available data. Always take into account that the treatment will also be dependent on availability of resources and existing treatment guidelines, considered by other Pain Management Practitioners as being common knowledge and practice, at the time of the intervention. For Medico-Legal purposes, it is also important to point out that variation in procedural techniques and pharmacological choices are the acceptable norm. The indications, contraindications, technique, and results of the above procedure should only be interpreted and judged by a Board-Certified Interventional Pain Specialist with extensive familiarity and expertise in the same exact procedure and technique.

## 2020-11-23 NOTE — Addendum Note (Signed)
Addended by: Despina Hidden on: 11/23/2020 01:02 PM   Modules accepted: Orders

## 2020-11-23 NOTE — Progress Notes (Signed)
Safety precautions to be maintained throughout the outpatient stay will include: orient to surroundings, keep bed in low position, maintain call bell within reach at all times, provide assistance with transfer out of bed and ambulation.  

## 2020-11-23 NOTE — Progress Notes (Signed)
   11/23/2020 9:59 AM   James Hood 1940-07-17 [id number]  Reason for visit: Follow up BPH/OAB  HPI: 80 year old male with mixed urinary symptoms of weak stream, frequency, urgency, nocturia.  He had been on Flomax and finasteride by Dr. Pilar Jarvis long-term.  CT has shown a small 24 g prostate, PVRs have been normal, and cystoscopy showed small prostate with normal-appearing bladder.  We trialed Myrbetriq 50 mg daily and discontinued finasteride for more of an overactive bladder picture, and this has improved his urinary symptoms moderately.  IPSS score today is 9, and PVR is normal at 100 mL with no urge to void.  He also continues to consume high amounts of diet ginger ale and carbonated water despite our multiple discussions about behavioral strategies.  He has nocturia once per night.  The Myrbetriq is expensive and he is interested about other options. I sent Vibegron to pharmacy to see if this would be covered, and we discussed the process of the pharmacy reaching out to him via cost.  I instructed him not to take these medications together.  Continue Flomax Okay to replace Myrbetriq with vibegron if more cost effective RTC 1 year IPSS/PVR   Billey Co, MD  [facility] [address] [place], [place] [address] [phone]

## 2020-11-24 ENCOUNTER — Telehealth: Payer: Self-pay | Admitting: *Deleted

## 2020-11-24 NOTE — Telephone Encounter (Signed)
Spoke with the pharmacist. They misread the sig. It was fine with the current order, fyi.

## 2020-11-24 NOTE — Telephone Encounter (Signed)
Patient pharmacy requesting refills for patient Trazodone 100mg  with directions of 1-1.5 Tablets by mouth at Night As Needed for Sleep. Per pt pharmacy, the medication they have in the system is from 11-14-20 but the SIG is different. [facility] is [facility]

## 2020-11-25 NOTE — Telephone Encounter (Signed)
noted 

## 2020-11-29 ENCOUNTER — Telehealth: Payer: Self-pay

## 2020-11-29 NOTE — Telephone Encounter (Signed)
Incoming approval of Gemtesa for patient approved through 05/20/2021.

## 2020-12-01 ENCOUNTER — Telehealth: Payer: Self-pay

## 2020-12-01 ENCOUNTER — Ambulatory Visit: Payer: PPO

## 2020-12-01 NOTE — Telephone Encounter (Signed)
Pt called and needs refill on tamsulosin (FLOMAX) 0.4 MG CAPS capsule sent to [facility]. Pt states that the pharm has been trying to send it over for a couple of days.

## 2020-12-02 MED ORDER — TAMSULOSIN HCL 0.4 MG PO CAPS: ORAL_CAPSULE | ORAL | 0 refills | Status: DC

## 2020-12-06 ENCOUNTER — Telehealth: Payer: Self-pay | Admitting: *Deleted

## 2020-12-06 NOTE — Telephone Encounter (Signed)
Attempted to call for pre appointment review of allergies/meds. Message left. 

## 2020-12-07 ENCOUNTER — Telehealth: Payer: Self-pay

## 2020-12-07 ENCOUNTER — Encounter: Payer: Self-pay | Admitting: Student in an Organized Health Care Education/Training Program

## 2020-12-07 ENCOUNTER — Ambulatory Visit (INDEPENDENT_AMBULATORY_CARE_PROVIDER_SITE_OTHER): Payer: PPO | Admitting: Licensed Clinical Social Worker

## 2020-12-07 ENCOUNTER — Ambulatory Visit
Payer: PPO | Attending: Student in an Organized Health Care Education/Training Program | Admitting: Student in an Organized Health Care Education/Training Program

## 2020-12-07 ENCOUNTER — Other Ambulatory Visit: Payer: Self-pay

## 2020-12-07 DIAGNOSIS — F3341 Major depressive disorder, recurrent, in partial remission: Secondary | ICD-10-CM | POA: Diagnosis not present

## 2020-12-07 DIAGNOSIS — M19072 Primary osteoarthritis, left ankle and foot: Secondary | ICD-10-CM | POA: Diagnosis not present

## 2020-12-07 DIAGNOSIS — G894 Chronic pain syndrome: Secondary | ICD-10-CM | POA: Diagnosis not present

## 2020-12-07 NOTE — Progress Notes (Signed)
Patient: James Hood  Service Category: E/M  Provider: [name], MD  DOB: Sep 24, 1940  DOS: 12/07/2020  Location: Office  MRN: [id number]  Setting: Ambulatory outpatient  Referring Provider: Leone Haven, MD  Type: Established Patient  Specialty: Interventional Pain Management  PCP: Leone Haven, MD  Location: Home  Delivery: TeleHealth     Virtual Encounter - Pain Management PROVIDER NOTE: Information contained herein reflects review and annotations entered in association with encounter. Interpretation of such information and data should be left to medically-trained personnel. Information provided to patient can be located elsewhere in the medical record under "Patient Instructions". Document created using STT-dictation technology, any transcriptional errors that may result from process are unintentional.    Contact & Pharmacy Preferred: [phone] Home: [phone] (home) Mobile: [phone] (mobile) E-mail: [email or web address]  [facility] [place] [name], [place] [address] Phone: [phone] Fax: [phone]  [facility] [name], [place] [address] [place] [address] Phone: [phone] Fax: [phone]  [facility] [address], [place] [address] [place] [address] Phone: [phone] Fax: [place] [place] [address] Phone: [phone] Fax: [phone]   Pre-screening  James Hood offered "in-person" vs "virtual" encounter. He indicated preferring virtual for this encounter.   Reason COVID-19*  Social distancing based on CDC and AMA recommendations.   I contacted James Hood on 12/07/2020 via video conference.      I clearly identified myself as [name], MD. I  verified that I was speaking with the correct person using two identifiers (Name: James Hood, and date of birth: March 17, 1941).  Consent I sought verbal advanced consent from James Hood for virtual visit interactions. I informed James Hood of possible security and privacy concerns, risks, and limitations associated with providing "not-in-person" medical evaluation and management services. I also informed James Hood of the availability of "in-person" appointments. Finally, I informed him that there would be a charge for the virtual visit and that he could be  personally, fully or partially, financially responsible for it. James Hood expressed understanding and agreed to proceed.   Historic Elements   James Hood is a 80 y.o. year old, male patient evaluated today after our last contact on 11/23/2020. 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 James Hood has a current medication list  which includes the following prescription(s): aspirin ec, budesonide, bupropion, freestyle libre 14 day sensor, diphenoxylate-atropine, divalproex, donepezil, fenofibrate, gabapentin, hydrocodone-acetaminophen, [START ON 12/23/2020] hydrocodone-acetaminophen, [START ON 01/22/2021] hydrocodone-acetaminophen, hyoscyamine sulfate sl, jardiance, lisinopril-hydrochlorothiazide,  memantine, metoprolol succinate, mirabegron er, multiple vitamins-minerals, naloxegol oxalate, omega-3 acid ethyl esters, omeprazole, primidone, quetiapine, rosuvastatin, ozempic (0.25 or 0.5 mg/dose), tamsulosin, trazodone, and gemtesa. He  reports that he has been smoking cigarettes. He has a 30.00 pack-year smoking history. He has never used smokeless tobacco. He reports previous alcohol use. He reports that he does not use drugs. James Hood has No Known Allergies.   HPI  Today, he is being contacted for a post-procedure assessment.   Post-Procedure Evaluation  Procedure (11/23/2020):   Type: Diagnostic True Ankle Steroid Injection                 Region: Dorsal             Target Area: Transverse tarsal joint Level: Ankle Laterality: Left  Anxiolysis: Please see nurses note.  Effectiveness during initial hour after procedure (Ultra-Short Term Relief): 100 %   Local anesthetic used: Long-acting (4-6 hours) Effectiveness: Defined as any analgesic benefit obtained secondary to the administration of local anesthetics. This carries significant diagnostic value as to the etiological location, or anatomical origin, of the pain. Duration of benefit is expected to coincide with the duration of the local anesthetic used.  Effectiveness during initial 4-6 hours after procedure (Short-Term Relief): 100 %   Long-term benefit: Defined as any relief past the pharmacologic duration of the local anesthetics.  Effectiveness past the initial 6 hours after procedure (Long-Term Relief): 85 %   Benefits, current: Defined as benefit present at  the time of this evaluation.   Analgesia:  90-100% better Function: Somewhat improved   Laboratory Chemistry Profile   Renal Lab Results  Component Value Date   BUN 32 (H) 10/11/2020   CREATININE 1.11 10/11/2020   GFR 62.63 10/11/2020   GFRAA >60 09/25/2019   GFRNONAA 55 (L) 06/08/2020    Hepatic Lab Results  Component Value Date   AST 15 10/11/2020   ALT 11 10/11/2020   ALBUMIN 4.1 10/11/2020   ALKPHOS 41 10/11/2020   AMMONIA 14 08/09/2016    Electrolytes Lab Results  Component Value Date   NA 137 10/11/2020   K 4.4 10/11/2020   CL 100 10/11/2020   CALCIUM 9.8 10/11/2020   MG 1.4 (L) 09/08/2020    Bone Lab Results  Component Value Date   VD25OH 24.57 (L) 10/11/2020    Inflammation (CRP: Acute Phase) (ESR: Chronic Phase) Lab Results  Component Value Date   ESRSEDRATE 15 07/20/2019   LATICACIDVEN 1.9 06/08/2020         Note: Above Lab results reviewed.  Imaging  MR Brain Wo Contrast CLINICAL DATA:  Ataxia  EXAM: MRI HEAD WITHOUT CONTRAST  TECHNIQUE: Multiplanar, multiecho pulse sequences of the brain and surrounding structures were obtained without intravenous contrast.  COMPARISON:  None.  FINDINGS: Brain: No acute infarct, mass effect or extra-axial collection. No acute or chronic hemorrhage. There is multifocal hyperintense T2-weighted signal within the white matter. Advanced atrophy for age. The midline structures are normal.  Vascular: Major flow voids are preserved.  Skull and upper cervical spine: Normal calvarium and skull base. Visualized upper cervical spine and soft tissues are normal.  Sinuses/Orbits:No paranasal sinus fluid levels or advanced mucosal thickening. No mastoid or middle ear effusion. Normal orbits.  IMPRESSION: 1. No acute intracranial abnormality. 2. 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  Electronically Signed   By: Ulyses Jarred M.D.   On: 07/12/2020 03:40  Assessment  The primary  encounter diagnosis was Primary osteoarthritis of left  ankle. A diagnosis of Chronic pain syndrome was also pertinent to this visit.  Plan of Care    Significant pain relief and improvement in functional status, after left ankle injection.  Patient states that his pain relief currently is approximately 90 to 95%.  Of instructed him to keep his follow-up for 02/02/2021 for medication management.  Should he have an increase in his ankle pain prior to that, can consider repeating left ankle steroid injection.  Patient endorsed understanding.  Follow-up plan:   Return for Keep sch. appt.    Recent Visits Date Type Provider Dept  11/23/20 Procedure visit [name], MD [facility]  11/15/20 Office Visit [name], MD [facility]  Showing recent visits within past 90 days and meeting all other requirements Today's Visits Date Type Provider Dept  12/07/20 Telemedicine [name], MD [facility]  Showing today's visits and meeting all other requirements Future Appointments Date Type Provider Dept  02/02/21 Appointment [name], MD [facility]  Showing future appointments within next 90 days and meeting all other requirements I discussed the assessment and treatment plan with the patient. The patient was provided an opportunity to ask questions and all were answered. The patient agreed with the plan and demonstrated an understanding of the instructions.  Patient advised to call back or seek an in-person evaluation if the symptoms or condition worsens.  Duration of encounter: 45mnutes.  Note by: BGillis Santa MD Date: 12/07/2020; Time: 2:14 PM

## 2020-12-07 NOTE — Progress Notes (Signed)
   THERAPIST PROGRESS NOTE  Session Time: 8-9a  Participation Level: Active  Behavioral Response: Neat and Well GroomedAlertAnxious  Type of Therapy: Individual Therapy  Treatment Goals addressed: Anxiety  Interventions: Supportive and Reframing  Summary: James Hood is a 80 y.o. male who presents with stable symptoms related to depression diagnosis. Patient reports that mood is stable and that he is managing situational stressors well.   Allowed patient safe space to explore and express thoughts and feelings associated with recent external stressors and life events. Patient reports that initially he was concerned about his thoughts of planning his memorial service, and wanted to discuss this. Patient reports that he is contacted a pastor that he is comfortable with, and wants this pastor to manage his service. Patient reports that he is already picked out songs, and specifics of the message. Allowed patient to use thoughts and feelings that were triggered while planning memorial service to reflect on overall life, and celebration of life. Patient feeling more relieved--he was worried initially about the planning period patient denies any suicidal thoughts of any kind.   Explored patients relationship with brother, and how politics and differences have triggered their estrangement. Allowed patient to explore his own perspective about tolerance and tolerating differences in others.   Discussed patients commitment and loyalty to Alcoholics Anonymous, and AA meetings locally.  Patient is willing to be a sponsor, has a sponsor, and manages lots of the [employer].   Allowed patient to discuss relationship with wife and with ex-wife. Patient reports that current relationship is going well, and that he is very happy. Patient reports that ex-wife will always be someone that's close to his heart, and patient recognizes that he made some mistakes within that marriage.   Continued  recommendations are as follows: self care behaviors, positive social engagements, focusing on overall work/home/life balance, and focusing on positive physical and emotional wellness.  .   Suicidal/Homicidal: No  Therapist Response: [name] is able to identify an anxiety coping mechanism that has been successful in the past and increase its use. [name] is able to verbalize an understanding of how relaxation and diversion activities decrease levels of anxiety. [name] is exploring anxiety and trauma triggers from the past and understanding the relationship to present anxiety. [name] is continuing to develop skills to reduce irritability and increase normal social interaction with family and friends. These behaviors are reflective of both personal growth and progress. Treatment to continue as indicated.  Plan: Return again in 4 weeks.  Diagnosis: Axis I: MDD, recurrent, in partial remission    Axis II: No diagnosis    Rachel Bo Corbin Falck, LCSW 12/07/2020

## 2020-12-07 NOTE — Telephone Encounter (Signed)
Attempted to call patient for pre virtual appointment questions.  LM

## 2020-12-12 ENCOUNTER — Other Ambulatory Visit: Payer: Self-pay | Admitting: Family Medicine

## 2020-12-12 DIAGNOSIS — E1142 Type 2 diabetes mellitus with diabetic polyneuropathy: Secondary | ICD-10-CM

## 2020-12-13 ENCOUNTER — Encounter: Payer: Self-pay | Admitting: Anesthesiology

## 2020-12-13 ENCOUNTER — Ambulatory Visit: Payer: PPO | Admitting: Anesthesiology

## 2020-12-13 ENCOUNTER — Ambulatory Visit
Admission: RE | Admit: 2020-12-13 | Discharge: 2020-12-13 | Disposition: A | Discharge status: Home / Self Care | Payer: PPO | Attending: Gastroenterology | Admitting: Gastroenterology

## 2020-12-13 ENCOUNTER — Encounter
Admission: RE | Disposition: A | Discharge status: Home / Self Care | Payer: Self-pay | Source: Home / Self Care | Attending: Gastroenterology

## 2020-12-13 ENCOUNTER — Other Ambulatory Visit: Payer: Self-pay

## 2020-12-13 DIAGNOSIS — K5710 Diverticulosis of small intestine without perforation or abscess without bleeding: Secondary | ICD-10-CM | POA: Diagnosis not present

## 2020-12-13 DIAGNOSIS — Z09 Encounter for follow-up examination after completed treatment for conditions other than malignant neoplasm: Secondary | ICD-10-CM | POA: Diagnosis not present

## 2020-12-13 DIAGNOSIS — K2289 Other specified disease of esophagus: Secondary | ICD-10-CM | POA: Diagnosis not present

## 2020-12-13 DIAGNOSIS — Z8719 Personal history of other diseases of the digestive system: Secondary | ICD-10-CM | POA: Diagnosis not present

## 2020-12-13 DIAGNOSIS — Z7982 Long term (current) use of aspirin: Secondary | ICD-10-CM | POA: Diagnosis not present

## 2020-12-13 DIAGNOSIS — K219 Gastro-esophageal reflux disease without esophagitis: Secondary | ICD-10-CM | POA: Insufficient documentation

## 2020-12-13 DIAGNOSIS — K2270 Barrett's esophagus without dysplasia: Secondary | ICD-10-CM | POA: Diagnosis not present

## 2020-12-13 DIAGNOSIS — K314 Gastric diverticulum: Secondary | ICD-10-CM | POA: Diagnosis not present

## 2020-12-13 DIAGNOSIS — Z7984 Long term (current) use of oral hypoglycemic drugs: Secondary | ICD-10-CM | POA: Diagnosis not present

## 2020-12-13 DIAGNOSIS — Z79899 Other long term (current) drug therapy: Secondary | ICD-10-CM | POA: Diagnosis not present

## 2020-12-13 DIAGNOSIS — Z7952 Long term (current) use of systemic steroids: Secondary | ICD-10-CM | POA: Insufficient documentation

## 2020-12-13 DIAGNOSIS — F418 Other specified anxiety disorders: Secondary | ICD-10-CM | POA: Diagnosis not present

## 2020-12-13 DIAGNOSIS — K2100 Gastro-esophageal reflux disease with esophagitis, without bleeding: Secondary | ICD-10-CM | POA: Diagnosis not present

## 2020-12-13 HISTORY — PX: ESOPHAGOGASTRODUODENOSCOPY (EGD) WITH PROPOFOL: SHX5813

## 2020-12-13 LAB — GLUCOSE, CAPILLARY: Glucose-Capillary: 142 mg/dL — ABNORMAL HIGH (ref 70–99)

## 2020-12-13 SURGERY — ESOPHAGOGASTRODUODENOSCOPY (EGD) WITH PROPOFOL
Anesthesia: General

## 2020-12-13 MED ORDER — LIDOCAINE HCL (PF) 2 % IJ SOLN
INTRAMUSCULAR | Status: AC
  Filled 2020-12-13: qty 5

## 2020-12-13 MED ORDER — PROPOFOL 500 MG/50ML IV EMUL
INTRAVENOUS | Status: DC | PRN
  Administered 2020-12-13: 120 ug/kg/min via INTRAVENOUS

## 2020-12-13 MED ORDER — PROPOFOL 500 MG/50ML IV EMUL
INTRAVENOUS | Status: AC
  Filled 2020-12-13: qty 50

## 2020-12-13 MED ORDER — LIDOCAINE HCL (CARDIAC) PF 100 MG/5ML IV SOSY
PREFILLED_SYRINGE | INTRAVENOUS | Status: DC | PRN
  Administered 2020-12-13: 30 mg via INTRAVENOUS

## 2020-12-13 MED ORDER — SODIUM CHLORIDE 0.9 % IV SOLN: INTRAVENOUS | Status: DC

## 2020-12-13 NOTE — Op Note (Signed)
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 Caryl Bis (Referring MD) Medicines:             Monitored Anesthesia Care Complications:         No immediate complications. Estimated blood loss:                         Minimal. Procedure:             Pre-Anesthesia Assessment:                        - Prior to the procedure, a History and Physical was                         performed, and patient medications and allergies were                         reviewed. The patient is competent. The risks and                         benefits of the procedure and the sedation options and                         risks were discussed with the patient. All questions                         were answered and informed consent was obtained.                         Patient identification and proposed procedure were                         verified by the physician, the nurse, the anesthetist                         and the technician in the endoscopy suite. Mental                         Status Examination: alert and oriented. Airway                         Examination: normal oropharyngeal airway and neck                         mobility. Respiratory Examination: clear to  auscultation. CV Examination: normal. Prophylactic                         Antibiotics: The patient does not require prophylactic                         antibiotics. Prior Anticoagulants: The patient has                         taken no previous anticoagulant or  antiplatelet agents                         except for aspirin. ASA Grade Assessment: III - A                         patient with severe systemic disease. After reviewing                         the risks and benefits, the patient was deemed in                         satisfactory condition to undergo the procedure. The                         anesthesia plan was to use monitored anesthesia care                         (MAC). Immediately prior to administration of                         medications, the patient was re-assessed for adequacy                         to receive sedatives. The heart rate, respiratory                         rate, oxygen saturations, blood pressure, adequacy of                         pulmonary ventilation, and response to care were                         monitored throughout the procedure. The physical                         status of the patient was re-assessed after the                         procedure.                        After obtaining informed consent, the endoscope was                         passed under direct vision. Throughout the procedure,                         the patient's blood pressure, pulse, and oxygen  saturations were monitored continuously. The Endoscope                         was introduced through the mouth, and advanced to the                         second part of duodenum. The upper GI endoscopy was                         accomplished without difficulty. The patient tolerated                         the procedure well. Findings:      The Z-line was irregular. Did not meet criteria for recurrent barrett's       but given history of high-grade dysplasia s/p RFA biopsies were taken       with a cold forceps for histology. Estimated blood loss was minimal.      A single area of ectopic gastric mucosa was found in the upper third of       the esophagus.      The exam of the esophagus was otherwise  normal.      The entire examined stomach was normal.      A large non-bleeding diverticulum was found in the fourth portion of the       duodenum.      The exam of the duodenum was otherwise normal. Impression:            - Z-line irregular. Biopsied.                        - Ectopic gastric mucosa in the upper third of the                         esophagus.                        - Normal stomach.                        - Non-bleeding duodenal diverticulum. Recommendation:        - Discharge patient to home.                        - Resume previous diet.                        - Continue present medications.                        - Await pathology results.                        - Return to referring physician as previously                         scheduled.                        - Repeat upper endoscopy to be determined based on  risks vs benefits given patient age and eradicated BE. Procedure Code(s):     --- Professional ---                        [phone], Esophagogastroduodenoscopy, flexible,                         transoral; with biopsy, single or multiple Diagnosis Code(s):     --- Professional ---                        K22.8, Other specified diseases of esophagus                        Q40.2, Other specified congenital malformations of                         stomach                        K22.70, Barrett's esophagus without dysplasia                        Z09, Encounter for follow-up examination after                         completed treatment for conditions other than                         malignant neoplasm                        K57.10, Diverticulosis of small intestine without                         perforation or abscess without bleeding CPT copyright 2019 American Medical Association. All rights reserved. The codes documented in this report are preliminary and upon coder review may  be revised to meet current compliance  requirements. Andrey Farmer MD, MD 12/13/2020 9:15:40 AM Number of Addenda: 0 Note Initiated On: 12/13/2020 8:36 AM Estimated Blood Loss:  Estimated blood loss was minimal.      [facility]

## 2020-12-13 NOTE — Transfer of Care (Signed)
Immediate Anesthesia Transfer of Care Note  Patient: James Hood  Procedure(s) Performed: ESOPHAGOGASTRODUODENOSCOPY (EGD) WITH PROPOFOL  Patient Location: PACU  Anesthesia Type:General  Level of Consciousness: awake and sedated  Airway & Oxygen Therapy: Patient Spontanous Breathing and Patient connected to nasal cannula oxygen  Post-op Assessment: Report given to RN and Post -op Vital signs reviewed and stable  Post vital signs: Reviewed and stable  Last Vitals:  Vitals Value Taken Time  BP    Temp    Pulse    Resp    SpO2      Last Pain:  Vitals:   12/13/20 0756  TempSrc: Temporal  PainSc: 0-No pain         Complications: No notable events documented.

## 2020-12-13 NOTE — Anesthesia Postprocedure Evaluation (Signed)
Anesthesia Post Note  Patient: James Hood  Procedure(s) Performed: ESOPHAGOGASTRODUODENOSCOPY (EGD) WITH PROPOFOL  Patient location during evaluation: Endoscopy Anesthesia Type: General Level of consciousness: awake and alert and oriented Pain management: pain level controlled Vital Signs Assessment: post-procedure vital signs reviewed and stable Respiratory status: spontaneous breathing, nonlabored ventilation and respiratory function stable Cardiovascular status: blood pressure returned to baseline and stable Postop Assessment: no signs of nausea or vomiting Anesthetic complications: no   No notable events documented.   Last Vitals:  Vitals:   12/13/20 0920 12/13/20 0940  BP: 94/60 110/61  Pulse: 71 (!) 53  Resp: 15 12  Temp:    SpO2: 95% 95%    Last Pain:  Vitals:   12/13/20 0900  TempSrc: Temporal  PainSc:                  Shaundrea Carrigg

## 2020-12-13 NOTE — Anesthesia Procedure Notes (Signed)
Date/Time: 12/13/2020 8:50 AM Performed by: Vaughan Sine Pre-anesthesia Checklist: Patient identified, Emergency Drugs available, Suction available, Timeout performed and Patient being monitored Patient Re-evaluated:Patient Re-evaluated prior to induction Oxygen Delivery Method: Nasal cannula Preoxygenation: Pre-oxygenation with 100% oxygen Induction Type: IV induction Airway Equipment and Method: Bite block Placement Confirmation: CO2 detector and positive ETCO2

## 2020-12-13 NOTE — Interval H&P Note (Signed)
History and Physical Interval Note:  12/13/2020 8:48 AM  James Hood  has presented today for surgery, with the diagnosis of Barrett's.  The various methods of treatment have been discussed with the patient and family. After consideration of risks, benefits and other options for treatment, the patient has consented to  Procedure(s) with comments: ESOPHAGOGASTRODUODENOSCOPY (EGD) WITH PROPOFOL (N/A) - IDDM as a surgical intervention.  The patient's history has been reviewed, patient examined, no change in status, stable for surgery.  I have reviewed the patient's chart and labs.  Questions were answered to the patient's satisfaction.     Lesly Rubenstein  Ok to proceed with EGD

## 2020-12-13 NOTE — H&P (Signed)
Outpatient short stay form Pre-procedure 12/13/2020 8:45 AM Raylene Miyamoto MD, MPH  Primary Physician: Dr. Caryl Bis  Reason for visit:  Hx of barrett's esophagus with high grade dysplasia  History of present illness:   80 y/o gentleman with history of BE's with high grade dysplasia s/p RF ablation with eradication of BE's here for surveillance EGD. No blood thinners. No significant symptoms. No GI malignancies.    Current Facility-Administered Medications:    0.9 %  sodium chloride infusion, , Intravenous, Continuous, Mckenley Birenbaum, Hilton Cork, MD, Last Rate: 20 mL/hr at 12/13/20 0834, Continued from Pre-op at 12/13/20 0834  Medications Prior to Admission  Medication Sig Dispense Refill Last Dose   aspirin EC 81 MG tablet Take 81 mg by mouth daily.   12/12/2020 at 2000   budesonide (ENTOCORT EC) 3 MG 24 hr capsule Take 6 mg by mouth daily.   12/12/2020   buPROPion (WELLBUTRIN XL) 300 MG 24 hr tablet Take 1 tablet (300 mg total) by mouth daily. 90 tablet 0 12/12/2020   diphenoxylate-atropine (LOMOTIL) 2.5-0.025 MG tablet Take 1 tablet by mouth in the morning and at bedtime.   12/12/2020   divalproex (DEPAKOTE ER) 500 MG 24 hr tablet Take 1 tablet by mouth 2 (two) times daily.   12/12/2020   donepezil (ARICEPT) 10 MG tablet Take 10 mg by mouth at bedtime.   12/12/2020   fenofibrate (TRICOR) 145 MG tablet TAKE ONE TABLET BY MOUTH DAILY 90 tablet 1 12/12/2020   gabapentin (NEURONTIN) 600 MG tablet TAKE TWO TABLETS BY MOUTH THREE TIMES A DAY 180 tablet 1 12/13/2020 at 0400   glimepiride (AMARYL) 4 MG tablet Take 4 mg by mouth daily with breakfast.   12/12/2020   HYDROcodone-acetaminophen (NORCO) 10-325 MG tablet Take 1 tablet by mouth every 4 (four) hours as needed for severe pain. Must last 30 days. 180 tablet 0 12/12/2020   Hyoscyamine Sulfate SL 0.125 MG SUBL Place under the tongue.   12/12/2020   JARDIANCE 25 MG TABS tablet TAKE ONE TABLET BY MOUTH DAILY 30 tablet 5 12/12/2020    lisinopril-hydrochlorothiazide (ZESTORETIC) 20-25 MG tablet Take 1 tablet by mouth daily. 90 tablet 1 12/12/2020   memantine (NAMENDA) 10 MG tablet Take 1 tablet by mouth daily.   12/12/2020   metFORMIN (GLUCOPHAGE) 1000 MG tablet Take 1,000 mg by mouth 2 (two) times daily with a meal.   12/12/2020   metoprolol succinate (TOPROL-XL) 25 MG 24 hr tablet Take 25 mg by mouth daily.   12/12/2020   mirabegron ER (MYRBETRIQ) 50 MG TB24 tablet Take 1 tablet (50 mg total) by mouth daily. 30 tablet 11 12/12/2020   Multiple Vitamins-Minerals (CENTRUM SILVER PO) Take 1 tablet by mouth daily.   12/12/2020   naloxegol oxalate (MOVANTIK) 12.5 MG TABS tablet Take 1 tablet (12.5 mg total) by mouth daily. Do not break tablet. Take on empty stomach 1 hour before or 2 hours after a meal. 30 tablet 2 12/12/2020   omega-3 acid ethyl esters (LOVAZA) 1 g capsule Take 2 capsules (2 g total) by mouth 2 (two) times daily. 360 capsule 3 12/12/2020   omeprazole (PRILOSEC) 20 MG capsule Take 20 mg by mouth 2 (two) times daily before a meal.    12/12/2020   primidone (MYSOLINE) 50 MG tablet Take 150-250 mg by mouth 2 (two) times daily. Take 250 mg by mouth in the morning & take 150 mg at night.   12/12/2020   propranolol (INDERAL) 60 MG tablet Take 60 mg by mouth 2 (  two) times daily.   12/12/2020   QUEtiapine (SEROQUEL) 100 MG tablet Take 150 mg by mouth at bedtime.   12/12/2020   simvastatin (ZOCOR) 40 MG tablet Take 40 mg by mouth daily.   12/12/2020   tamsulosin (FLOMAX) 0.4 MG CAPS capsule TAKE ONE CAPSULE BY MOUTH DAILY AFTER DINNER 90 capsule 0 12/12/2020   traZODone (DESYREL) 100 MG tablet 100-150 mg at night as needed for sleep 135 tablet 0 12/12/2020   Vibegron (GEMTESA) 75 MG TABS Take 1 tablet by mouth daily. 30 tablet 11 12/12/2020   Continuous Blood Gluc Sensor (FREESTYLE LIBRE 14 DAY SENSOR) MISC APPLY ONE SENSOR EVERY 14 DAYS TO MONITOR BLOOD GLUCOSE LEVELS. REMOVE OLD SENSOR BEFORE APPLYING NEW SENSOR 2 each 1    [START ON  12/23/2020] HYDROcodone-acetaminophen (NORCO) 10-325 MG tablet Take 1 tablet by mouth every 4 (four) hours as needed for severe pain. Must last 30 days. 180 tablet 0    [START ON 01/22/2021] HYDROcodone-acetaminophen (NORCO) 10-325 MG tablet Take 1 tablet by mouth every 4 (four) hours as needed for severe pain. Must last 30 days. 180 tablet 0    Semaglutide,0.25 or 0.'5MG'$ /DOS, (OZEMPIC, 0.25 OR 0.5 MG/DOSE,) 2 MG/1.5ML SOPN Inject 0.5 mg into the skin once a week. 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    Physical Exam  Gen: Alert, oriented. Appears stated age.  HEENT: PERRLA. Lungs: No respiratory distress CV: RRR Abd: soft, benign, no masses Ext: No edema    Planned procedures: Proceed with EGD. The patient understands the nature of the planned procedure, indications, risks, alternatives and potential complications including but not limited to bleeding, infection, perforation, damage to internal organs and possible oversedation/side effects from anesthesia. The patient agrees and gives consent to proceed.  Please refer to procedure notes for findings, recommendations and patient disposition/instructions.     Raylene Miyamoto MD, MPH Gastroenterology 12/13/2020  8:45 AM

## 2020-12-13 NOTE — Anesthesia Preprocedure Evaluation (Signed)
Anesthesia Evaluation  Patient identified by MRN, date of birth, ID band Patient awake    Reviewed: Allergy & Precautions, NPO status , Patient's Chart, lab work & pertinent test results  History of Anesthesia Complications Negative for: history of anesthetic complications  Airway Mallampati: II  TM Distance: >3 FB Neck ROM: Full    Dental  (+) Poor Dentition, Missing   Pulmonary neg sleep apnea, neg COPD, Current SmokerPatient did not abstain from smoking.,    breath sounds clear to auscultation- rhonchi (-) wheezing      Cardiovascular Exercise Tolerance: Good hypertension, Pt. on medications (-) CAD, (-) Past MI, (-) Cardiac Stents and (-) CABG  Rhythm:Regular Rate:Normal - Systolic murmurs and - Diastolic murmurs    Neuro/Psych  Headaches, neg Seizures PSYCHIATRIC DISORDERS Anxiety Depression Dementia    GI/Hepatic Neg liver ROS, hiatal hernia, PUD, GERD  ,  Endo/Other  diabetes, Oral Hypoglycemic Agents  Renal/GU negative Renal ROS     Musculoskeletal  (+) Arthritis ,   Abdominal (+) - obese,   Peds  Hematology  (+) anemia ,   Anesthesia Other Findings Past Medical History: No date: Anxiety No date: Anxiety and depression No date: Arthritis No date: BPH (benign prostatic hyperplasia) 07/2017: Chronic bilateral low back pain with left-sided sciatica No date: Colon polyps No date: Dementia (HCC) No date: Depression No date: Diabetes mellitus type 2 in nonobese (HCC) No date: GERD (gastroesophageal reflux disease)     Comment:  BARRETTS ESOPHAGUS RESOLVED PER PATIENT No date: Headache No date: 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[place]) No date: History of hiatal hernia No date: Hypercholesteremia No date: Hypertension No date: Hyperthyroidism No date: Neuropathic pain No date: Primary localized osteoarthritis of right knee 08/26/2017: S/P insertion of spinal cord stimulator No date: Stomach ulcer No date: Tremor,  essential 04/02/2018: Wound infection complicating hardware ([place])   Reproductive/Obstetrics                             Anesthesia Physical Anesthesia Plan  ASA: 3  Anesthesia Plan: General   Post-op Pain Management:    Induction: Intravenous  PONV Risk Score and Plan: 0 and Propofol infusion  Airway Management Planned: Natural Airway  Additional Equipment:   Intra-op Plan:   Post-operative Plan:   Informed Consent: I have reviewed the patients History and Physical, chart, labs and discussed the procedure including the risks, benefits and alternatives for the proposed anesthesia with the patient or authorized representative who has indicated his/her understanding and acceptance.     Dental advisory given  Plan Discussed with: CRNA and Anesthesiologist  Anesthesia Plan Comments:         Anesthesia Quick Evaluation

## 2020-12-14 ENCOUNTER — Ambulatory Visit: Payer: PPO | Admitting: Pharmacist

## 2020-12-14 ENCOUNTER — Encounter: Payer: Self-pay | Admitting: Gastroenterology

## 2020-12-14 DIAGNOSIS — E782 Mixed hyperlipidemia: Secondary | ICD-10-CM

## 2020-12-14 DIAGNOSIS — E1142 Type 2 diabetes mellitus with diabetic polyneuropathy: Secondary | ICD-10-CM

## 2020-12-14 DIAGNOSIS — N1831 Chronic kidney disease, stage 3a: Secondary | ICD-10-CM

## 2020-12-14 LAB — SURGICAL PATHOLOGY

## 2020-12-14 MED ORDER — DAPAGLIFLOZIN PROPANEDIOL 10 MG PO TABS: ORAL_TABLET | ORAL | 3 refills | Status: DC

## 2020-12-14 NOTE — Patient Instructions (Signed)
Visit Information  PATIENT GOALS:  Goals Addressed               This Visit's Progress     Patient Stated     Medication Monitoring (pt-stated)        Patient Goals/Self-Care Activities Over the next 90 days, patient will:  - take medications as prescribed check glucose at least 3 times daily using CGM, document, and provide at future appointments collaborate with provider on medication access solutions engage in dietary modifications by reducing carbohydrate portion sizes Focus on tobacco cessation         Patient verbalizes understanding of instructions provided today and agrees to view in [place].   Plan: Telephone follow up appointment with care management team member scheduled for:  ~ 10 weeks as previously scheduled  Catie Darnelle Maffucci, PharmD, [place], [place] Clinical Pharmacist [employer] [facility] [employer] [phone]

## 2020-12-14 NOTE — Chronic Care Management (AMB) (Signed)
Chronic Care Management Pharmacy Note  12/14/2020 Name:  James Hood MRN:  [id number] DOB:  1940-06-28  Subjective: James Hood is an 80 y.o. year old male who is a primary patient of Caryl Bis, Angela Adam, MD.  The CCM team was consulted for assistance with disease management and care coordination needs.    Engaged with patient by telephone for  medication access conversation  in response to provider referral for pharmacy case management and/or care coordination services.   Consent to Services:  The patient was given information about Chronic Care Management services, agreed to services, and gave verbal consent prior to initiation of services.  Please see initial visit note for detailed documentation.   Patient Care Team: Leone Haven, MD as PCP - General (Family Medicine) De Hollingshead, RPH-CPP as Pharmacist (Pharmacist)  Objective:  Lab Results  Component Value Date   CREATININE 1.11 10/11/2020   CREATININE 1.47 09/08/2020   CREATININE 1.21 07/20/2020    Lab Results  Component Value Date   HGBA1C 7.1 (H) 09/08/2020   Last diabetic Eye exam:  Lab Results  Component Value Date/Time   HMDIABEYEEXA No Retinopathy 01/27/2020 03:37 PM    Last diabetic Foot exam: No results found for: HMDIABFOOTEX      Component Value Date/Time   CHOL 99 07/05/2020 1209   TRIG 226.0 (H) 07/05/2020 1209   HDL 43.20 07/05/2020 1209   CHOLHDL 2 07/05/2020 1209   VLDL 45.2 (H) 07/05/2020 1209   LDLCALC 57 09/25/2017 0924   LDLDIRECT 31.0 07/05/2020 1209    Hepatic Function Latest Ref Rng & Units 10/11/2020 06/08/2020 05/31/2020  Total Protein 6.0 - 8.3 g/dL 6.2 6.9 5.8(L)  Albumin 3.5 - 5.2 g/dL 4.1 3.3(L) 2.8(L)  AST 0 - 37 U/L _0 ALT 0 - 53 U/L _1 Alk Phosphatase 39 - 117 U/L 41 58 32(L)  Total Bilirubin 0.2 - 1.2 mg/dL 0.3 0.8 0.7  Bilirubin, Direct 0.0 - 0.3 mg/dL - - -    Lab Results  Component Value Date/Time   TSH 0.97 10/11/2020 10:08 AM    TSH 0.346 (L) 05/31/2020 12:34 AM   TSH 1.36 07/20/2019 09:02 AM    CBC Latest Ref Rng & Units 10/11/2020 06/08/2020 06/01/2020  WBC 4.0 - 10.5 K/uL 5.8 6.5 6.7  Hemoglobin 13.0 - 17.0 g/dL 12.6(L) 11.8(L) 10.2(L)  Hematocrit 39.0 - 52.0 % 37.5(L) 35.7(L) 31.0(L)  Platelets 150.0 - 400.0 K/uL 249.0 375 203    Lab Results  Component Value Date/Time   VD25OH 24.57 (L) 10/11/2020 10:08 AM   VD25OH 42.83 04/07/2019 08:54 AM    Clinical ASCVD: No  The ASCVD Risk score Mikey Bussing DC Jr., et al., 2013) failed to calculate for the following reasons:   The 2013 ASCVD risk score is only valid for ages 35 to 81      Social History   Tobacco Use  Smoking Status Every Day   Packs/day: 0.50   Years: 60.00   Pack years: 30.00   Types: Cigarettes  Smokeless Tobacco Never  Tobacco Comments   Last one at 0400   BP Readings from Last 3 Encounters:  12/13/20 110/61  11/23/20 (!) 114/55  11/23/20 110/70   Pulse Readings from Last 3 Encounters:  12/13/20 (!) 53  11/23/20 81  11/23/20 64   Wt Readings from Last 3 Encounters:  12/13/20 160 lb (72.6 kg)  11/23/20 164 lb (74.4 kg)  11/23/20 164 lb (74.4 kg)  Assessment: Review of patient past medical history, allergies, medications, health status, including review of consultants reports, laboratory and other test data, was performed as part of comprehensive evaluation and provision of chronic care management services.   SDOH:  (Social Determinants of Health) assessments and interventions performed:  SDOH Interventions    Flowsheet Row Most Recent Value  SDOH Interventions   Financial Strain Interventions Other (Comment)  [manufacturer assistance]       CCM Care Plan  No Known Allergies  Medications Reviewed Today     Reviewed by Herminio Commons, RN (Registered Nurse) on 12/13/20 at [place] List Status: <None>   Medication Order Taking? Sig Documenting Provider Last Dose Status Informant  aspirin EC 81 MG tablet [id number]  Yes Take 81 mg by mouth daily. [provider] 12/12/2020 2000 Active            Med Note Vanessa Lima, SUSAN   Tue Apr 15, 2018  8:47 AM)    budesonide (ENTOCORT EC) 3 MG 24 hr capsule [id number] Yes Take 6 mg by mouth daily. [provider] 12/12/2020 Active   buPROPion (WELLBUTRIN XL) 300 MG 24 hr tablet [id number] Yes Take 1 tablet (300 mg total) by mouth daily. Norman Clay, MD 12/12/2020 Active   Continuous Blood Gluc Sensor (FREESTYLE LIBRE 14 DAY SENSOR) MISC [id number]  APPLY ONE SENSOR EVERY 14 DAYS TO MONITOR BLOOD GLUCOSE LEVELS. [place] Leone Haven, MD  Active   diphenoxylate-atropine (LOMOTIL) 2.5-0.025 MG tablet [id number] Yes Take 1 tablet by mouth in the morning and at bedtime. [provider] 12/12/2020 Active   divalproex (DEPAKOTE ER) 500 MG 24 hr tablet [id number] Yes Take 1 tablet by mouth 2 (two) times daily. [provider] 12/12/2020 Active   donepezil (ARICEPT) 10 MG tablet [id number] Yes Take 10 mg by mouth at bedtime. [provider] 12/12/2020 Active Self  fenofibrate (TRICOR) 145 MG tablet [id number] Yes TAKE ONE TABLET BY MOUTH DAILY Leone Haven, MD 12/12/2020 Active   gabapentin (NEURONTIN) 600 MG tablet [id number] Yes TAKE TWO TABLETS BY MOUTH THREE TIMES A DAY Leone Haven, MD 12/13/2020 0400 Active   glimepiride (AMARYL) 4 MG tablet [id number] Yes Take 4 mg by mouth daily with breakfast. [provider] 12/12/2020 Active   HYDROcodone-acetaminophen (NORCO) 10-325 MG tablet [id number] Yes Take 1 tablet by mouth every 4 (four) hours as needed for severe pain. Must last 30 days. [name], MD 12/12/2020 Active   HYDROcodone-acetaminophen (NORCO) 10-325 MG tablet [id number]  Take 1 tablet by mouth every 4 (four) hours as needed for severe pain. Must last 30 days. [name], MD  Consider Medication Status and Discontinue (No longer needed (for PRN medications))    HYDROcodone-acetaminophen (NORCO) 10-325 MG tablet [id number]  Take 1 tablet by mouth every 4 (four) hours as needed for severe pain. Must last 30 days. [name], MD  Consider Medication Status and Discontinue (No longer needed (for PRN medications))   Hyoscyamine Sulfate SL 0.125 MG SUBL [id number] Yes Place under the tongue. [provider] 12/12/2020 Active   JARDIANCE 25 MG TABS tablet [id number] Yes TAKE ONE TABLET BY MOUTH DAILY Leone Haven, MD 12/12/2020 Active   lisinopril-hydrochlorothiazide (ZESTORETIC) 20-25 MG tablet [id number] Yes Take 1 tablet by mouth daily. Leone Haven, MD 12/12/2020 Active   memantine (NAMENDA) 10 MG tablet [id number] Yes Take 1 tablet by mouth daily. [provider] 12/12/2020 Active   metFORMIN (GLUCOPHAGE) 1000 MG tablet [id number]  Yes Take 1,000 mg by mouth 2 (two) times daily with a meal. [provider] 12/12/2020 Active   metoprolol succinate (TOPROL-XL) 25 MG 24 hr tablet [id number] Yes Take 25 mg by mouth daily. [provider] 12/12/2020 Active   mirabegron ER (MYRBETRIQ) 50 MG TB24 tablet [id number] Yes Take 1 tablet (50 mg total) by mouth daily. Billey Co, MD 12/12/2020 Active   Multiple Vitamins-Minerals (CENTRUM SILVER PO) [id number] Yes Take 1 tablet by mouth daily. [provider] 12/12/2020 Active   naloxegol oxalate (MOVANTIK) 12.5 MG TABS tablet [id number] Yes Take 1 tablet (12.5 mg total) by mouth daily. Do not break tablet. Take on empty stomach 1 hour before or 2 hours after a meal. [name], MD 12/12/2020 Active   omega-3 acid ethyl esters (LOVAZA) 1 g capsule [id number] Yes Take 2 capsules (2 g total) by mouth 2 (two) times daily. Leone Haven, MD 12/12/2020 Active   omeprazole (PRILOSEC) 20 MG capsule [id number] Yes Take 20 mg by mouth 2 (two) times daily before a meal.  [provider] 12/12/2020 Active Self           Med Note (CHAMBERS, KIMBERLY   Thu Aug 09, 2016 12:05 PM)     primidone (MYSOLINE) 50 MG tablet [id number] Yes Take 150-250 mg by mouth 2 (two) times daily. Take 250 mg by mouth in the morning & take 150 mg at night. [provider] 12/12/2020 Active Self  propranolol (INDERAL) 60 MG tablet [id number] Yes Take 60 mg by mouth 2 (two) times daily. [provider] 12/12/2020 Active   QUEtiapine (SEROQUEL) 100 MG tablet [id number] Yes Take 150 mg by mouth at bedtime. [provider] 12/12/2020 Active Pharmacy Records  Semaglutide,0.25 or 0.5MG/DOS, (OZEMPIC, 0.25 OR 0.5 MG/DOSE,) 2 MG/1.5ML SOPN [id number] No Inject 0.5 mg into the skin once a week. Leone Haven, MD 12/10/2020 Active Pharmacy Records  simvastatin (ZOCOR) 40 MG tablet [id number] Yes Take 40 mg by mouth daily. [provider] 12/12/2020 Active   tamsulosin (FLOMAX) 0.4 MG CAPS capsule [id number] Yes TAKE ONE CAPSULE BY MOUTH DAILY AFTER Burnett Harry, MD 12/12/2020 Active   traZODone (DESYREL) 100 MG tablet [id number] Yes 100-150 mg at night as needed for sleep Norman Clay, MD 12/12/2020 Active   Vibegron (GEMTESA) 75 MG TABS [id number] Yes Take 1 tablet by mouth daily. 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   Right leg pain 02/05/2020   Impingement of left ankle joint    Sleeping difficulty 08/24/2019   Hiccups 08/24/2019   Chronic pain of left ankle 02/27/2019   Vitamin D deficiency 02/18/2019   Left shoulder pain 02/02/2019   Neuropathy 09/24/2018   Lumbosacral radiculopathy 01/14/2018   Primary  osteoarthritis of left ankle 01/14/2018   Facet arthritis of lumbar region 01/14/2018   Chronic pain syndrome 08/26/2017   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   Thyroid nodule 02/27/2017   Fatty liver 02/27/2017   Purpura ([place]) 01/29/2017   Right hip pain 08/16/2016  Lumbar herniated disc 02/03/2016   Hypertriglyceridemia 12/09/2015   Anemia 12/09/2015   Falls 09/16/2015   Barrett's esophagus 09/16/2015   Chronic lumbar pain 09/16/2015   Benign prostatic hyperplasia 09/16/2015   Trochanteric bursitis of left hip 09/09/2015   Headache 09/09/2015   Difficulty in walking 06/01/2015   Amnesia 06/01/2015   HLD (hyperlipidemia) 02/23/2015   DJD (degenerative joint disease) of knee 11/15/2014   Hypertension    Tremor, essential    Primary localized osteoarthritis of right knee    Anxiety and depression    Absence of sensation 11/18/2013   Tobacco abuse 11/18/2013   Chronic diarrhea 11/12/2013   Benign neoplasm of colon 08/25/2013   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   Type 2 diabetes mellitus ([place]) 10/06/2012    Immunization History  Administered Date(s) Administered   Fluad Quad(high Dose 65+) 02/05/2020   Hepatitis A, Adult 12/02/2018   Influenza, High Dose Seasonal PF 01/29/2017, 03/19/2018, 01/10/2019   Influenza,inj,Quad PF,6+ Mos 03/13/2016   Influenza-Unspecified 01/21/2015   PFIZER(Purple Top)SARS-COV-2 Vaccination 06/01/2019, 06/22/2019   Pneumococcal Conjugate-13 02/07/2015   Pneumococcal Polysaccharide-23 01/18/2018   Tdap 09/16/2015, 01/18/2018   Zoster Recombinat (Shingrix) 01/03/2018, 05/09/2018   Zoster, Live 09/16/2015    Conditions to be addressed/monitored: HTN, HLD, and DMII  Care Plan : Medication Management  Updates made by De Hollingshead, RPH-CPP since 12/14/2020 12:00 AM     Problem: Diabetes, Depression      Long-Range Goal: Disease Progression Prevention   Start Date: 02/16/2020  Recent Progress: On track  Priority: Medium   Note:   Current Barriers:  Unable to independently afford treatment regimen  Pharmacist Clinical Goal(s):  Over the next 30 days, patient will verbalize ability to afford treatment regimen through collaboration with PharmD and provider.  Over the next 90 days, patient will maintain glycemic control as evidenced by A1c through collaboration with PharmD and provider.  Interventions: 1:1 collaboration with Leone Haven, MD regarding development and update of comprehensive plan of care as evidenced by provider attestation and co-signature Inter-disciplinary care team collaboration (see longitudinal plan of care) Comprehensive medication review performed; medication list updated in electronic medical record  Health Maintenance: Due for foot exam. Placed in appointment notes for next provider visit.   SDOH: Will revisit tobacco use at next appointment Does report cost concerns with medications.   Diabetes: Controlled per more relaxed goal of A1c <7.5%; current treatment: Jardiance 25 mg daily, Ozempic 0.5 mg weekly (max tolerated dose d/t GI upset/weight loss at 1 mg weekly) Approved for Ozempic assistance. Over income for [place] assistance.  Reports [facility] nurse reported POCT A1c of 6.2% a few weeks ago. Hx metformin ER - significant diarrhea that resolved upon discontinuation Calls today to report he is in the Coverage Gap and Jardiance has become expensive. His income would qualify him for [place] assistance. Complete home supply of Jardiance 25 mg daily and start Farxiga 10 mg daily. He will come by tomorrow to sign PAP application and we will submit for assistance.   Hypertension: Controlled per last clinic reading; current treatment: lisinopril/HCTZ 20/25 mg daily, metoprolol succinate 25 mg dail Previously recommended to continue current regimen at this time  Hyperlipidemia: Controlled; current treatment: rosuvastatin 40 mg daily, fenofibrate 145 mg daily, OTC omega 3  fatty acids 2 g BID Generic Lovaza and Vascepa too expensive. [place] funding closed.  Previously recommended to continue current regimen at this time  Depression/insomnia: Improved. Current regimen: bupropion XL 300 mg daily, trazodone 100-150 mg QPM.  Follows w/ Dr. Modesta Messing and [facility] previously discussed reducing dose of quetiapine to reduce risk of EPS. Patient declined.  Previously recommended to continue current collaboration with psychiatry.   Chronic Pain w/ recent ankle surgery: Improved per patient report; hydrocodone/APAP 10/325 mg up to TID, gabapentin 1200 mg TID. Follows w/ Dr. Holley Raring On Movantik 12.5 mg daily for opioid induced constipation. Reports now that he is having to manually disimpact himself.  Previously encouraged to continue to discuss balance of constipation vs prevention of episodes of fecal urgency/incontinence w/ pain management  Neurologic Conditions (pseudo dementia, tremor, sleep disorder) Well managed per patient report; current regimen: memantine 10 mg BID, donepezil 10 mg daily; primidone 250 mg QAM, 150 mg QPM; divalproex 500 mg BID, quetiapine 150 mg QPM; Follows w/ Dr. Melrose Nakayama.  Previously recommended to continue current regimen at this time along with collaboration w/ Neurology  Chronic Diarrhea/Colitis, Barrett's: Overly controlled per patient report; current regimen: budesonide 6 mg daily, Lomotil 1 tab QAM- taking BID currently, hyoscyamine PRN added at last visit; follows w/ [facility] Barrett's: omeprazole 20 mg BID Previously discussed to try reducing Lomotil to 1 tab QAM as noted on last GI AVS rather than BID to see if improvement in constipation. Continue to collaborate with GI, Pain Management to balance chronic diarrhea/colitis and opioid induced constipation.   OAB/BPH: Improved; current treatment regimen: Myrbetriq 50 mg daily, tamsulosin 0.4 mg daily; follows w/ Dr. Diamantina Providence Previously recommended to continue current  regimen at this time    Patient Goals/Self-Care Activities Over the next 90 days, patient will:  - take medications as prescribed check glucose at least 3 times daily using CGM, document, and provide at future appointments collaborate with provider on medication access solutions engage in dietary modifications by reducing carbohydrate portion sizes Focus on tobacco cessation  Follow Up Plan: Telephone follow up appointment with care management team member scheduled for: ~10 weeks as previously scheduled     Medication Assistance:  Ozempic obtained through [employer] medication assistance program.  Enrollment ends 04/19/21.  Farxiga application in process.   Patient's preferred pharmacy is:  [facility] [name], [place] [address] Phone: [phone] Fax: [phone]  [facility] [name], [place] - [address] [place] [address] Phone: [phone] Fax: [phone]  [facility] [address], [place] - [place] [address] Phone: [phone] Fax: [phone]  [facility], [place] [address] [place] [address] Phone: [phone] Fax: [phone]   Follow Up:  Patient agrees to Care Plan and Follow-up.  Plan: Telephone follow up appointment with care management team member scheduled for:  ~ 10 weeks as previously scheduled  Catie Darnelle Maffucci, PharmD, [place], [place] Clinical Pharmacist [employer] at [employer] [phone]

## 2020-12-15 ENCOUNTER — Telehealth: Payer: Self-pay | Admitting: Pharmacy Technician

## 2020-12-15 DIAGNOSIS — Z596 Low income: Secondary | ICD-10-CM

## 2020-12-15 NOTE — Progress Notes (Signed)
[facility])                                            [place] Team    12/15/2020  KEASTON HILLENBRAND [birth year] [id number]  .                                    Medication Assistance Referral  Referral From: [place] Catie T.   Medication/Company: [name] / [place] Patient application portion:  N/A Embedded PharmD had patient sign in clinic Provider application portion:  N/A Embedded Pharmd D had provider sign in clinic  to Dr. Tommi Rumps Provider address/fax verified via: Office website  Received both patient and provider portion(s) of patient assistance application(s) for [place]. Faxed completed application and required documents into [place].  Breeze Berringer P. Ireene Ballowe, [place]  [phone]

## 2020-12-22 ENCOUNTER — Telehealth: Payer: Self-pay | Admitting: Pharmacy Technician

## 2020-12-22 DIAGNOSIS — Z596 Low income: Secondary | ICD-10-CM

## 2020-12-22 NOTE — Progress Notes (Signed)
[facility])                                            [place] Team    12/22/2020  James Hood 04/06/1941 [id number]  Care coordination call placed to [employer] in regards to [facility] application.  Spoke to [place] who informed patient was APPROVED 12/20/20-05/20/21. She informed an order was placed with pharmacy today and should be delivered to the patient's home in the next 10-15 business days.  James Hood P. [name], [place]  [phone]

## 2020-12-26 ENCOUNTER — Other Ambulatory Visit: Payer: Self-pay | Admitting: Family Medicine

## 2020-12-26 DIAGNOSIS — E782 Mixed hyperlipidemia: Secondary | ICD-10-CM

## 2020-12-28 ENCOUNTER — Ambulatory Visit (INDEPENDENT_AMBULATORY_CARE_PROVIDER_SITE_OTHER): Payer: PPO

## 2020-12-28 ENCOUNTER — Other Ambulatory Visit: Payer: Self-pay

## 2020-12-28 VITALS — BP 99/65 | HR 69 | Temp 96.3°F | Resp 15 | Ht 70.0 in | Wt 167.4 lb

## 2020-12-28 DIAGNOSIS — Z0000 Encounter for general adult medical examination without abnormal findings: Secondary | ICD-10-CM | POA: Diagnosis not present

## 2020-12-28 NOTE — Patient Instructions (Addendum)
[name] , Thank you for taking time to come for your Medicare Wellness Visit. I appreciate your ongoing commitment to your health goals. Please review the following plan we discussed and let me know if I can assist you in the future.   These are the goals we discussed:  Goals       Patient Stated     Healthy Lifestyle (pt-stated)      Stay active Healthy diet Good fluid intake      Medication Monitoring (pt-stated)      Patient Goals/Self-Care Activities Over the next 90 days, patient will:  - take medications as prescribed check glucose at least 3 times daily using CGM, document, and provide at future appointments collaborate with provider on medication access solutions engage in dietary modifications by reducing carbohydrate portion sizes Focus on tobacco cessation         This is a list of the screening recommended for you and due dates:  Health Maintenance  Topic Date Due   Complete foot exam   10/25/2020   COVID-19 Vaccine (5 - Booster for [place] series) 01/13/2021*   Flu Shot  02/13/2021*   Eye exam for diabetics  01/26/2021   Hemoglobin A1C  03/10/2021   Tetanus Vaccine  01/19/2028   Pneumonia vaccines  Completed   Zoster (Shingles) Vaccine  Completed   HPV Vaccine  Aged Out  *Topic was postponed. The date shown is not the original due date.    Opioid Pain Medicine Management Opioid pain medicines are strong medicines that are used to treat bad or very bad pain. When you take them for a short time, they can help you: Sleep better. Do better in physical therapy. Feel better during the first few days after you get hurt. Recover from surgery. Only take these medicines if a doctor says that you can. You should only take them for a short time. This is because opioids can be hard to stop taking (they are addictive). The longer you take opioids, the harder it may be to stop taking them (opioid use disorder). What are the risks? Opioids can cause problems (side  effects). Taking them for more than 3 days raises your chance of problems, such as: Trouble pooping (constipation). Feeling sick to your stomach (nausea). Vomiting. Feeling very sleepy. Confusion. Not being able to stop taking the medicine. Breathing problems. Taking opioids for a long time can make it hard for you to do daily tasks. It can also put you at risk for: Car accidents. Depression. Suicide. Heart attack. Taking too much of the medicine (overdose), which can sometimes lead to death. What is a pain treatment plan? A pain treatment plan is a plan made by you and your doctor. Work with your doctor to make a plan for treating your pain. To help you do this: Talk about the goals of your treatment, including: How much pain you might expect to have. How you will manage the pain. Talk about the risks and benefits of taking these medicines for your condition. Remember that a good treatment plan uses more than one approach and lowers the risks of side effects. Tell your doctor about the amount of medicines you take and about any drug or alcohol use. Get your pain medicine prescriptions from only one doctor. Pain can be managed with other treatments. Work with your doctor to find other ways to help your pain, such as: Physical therapy. Counseling. Eating healthy foods. Brain exercises. Massage. Meditation. Other pain medicines. Doing gentle exercises.  Tapering your use of opioids If you have been taking opioids for more than a few weeks, you may need to slowly decrease (taper) how much you take until you stop taking them. Doing this can lower your chance of having symptoms, such as: Pain and cramping in your belly (abdomen). Feeling sick to your stomach. Sweating. Feeling very sleepy. Feeling restless. Shaking you cannot control (tremors). Cravings for the medicine. Do not try to stop taking them by yourself. Work with your doctor to stop. Your doctorwill help you take less  until you are not taking the medicine at all. Follow these instructions at home: Safety and storage  While you are taking opioids: Do not drive. Do not use machines or power tools. Do not sign important papers (legal documents). Do not drink alcohol. Do not take sleeping pills. Do not take care of children by yourself. Do not do activities where you need to climb or be in high places, like working on a ladder. Do not go into any water, such as a lake, river, ocean, swimming pool, or hot tub. Keep your opioids locked up or in a place where children cannot reach them. Do not share your pain medicine with anyone.  Getting rid of leftover pills Do not save any leftover pills. Get rid of leftover pills safely by: Taking them to a take-back program in your area. Bringing them to a pharmacy that has a container for throwing away pills (pill disposal). Flushing them down the toilet. Check the label or package insert of your medicine to see whether this is safe to do. Throwing them in the trash. Check the label or package insert of your medicine to see whether this is safe to do. If it is safe to throw them out: Take the pills out of their container. Put the pills into a container you can seal. Mix the pills with used coffee grounds, food scraps, dirt, or cat litter. Put this in the trash. Activity Return to your normal activities as told by your doctor. Ask your doctor what activities are safe for you. Avoid doing things that make your pain worse. Do exercises as told by your doctor. General instructions You may need to take these actions to prevent or treat trouble pooping: Drink enough fluid to keep your pee (urine) pale yellow. Take over-the-counter or prescription medicines. Eat foods that are high in fiber. These include beans, whole grains, and fresh fruits and vegetables. Limit foods that are high in fat and sugar. These include fried or sweet foods. Keep all follow-up visits as  told by your doctor. This is important. Where to find support If you have been taking opioids for a long time, think about getting helpquitting from a local support group or counselor. Ask your doctor about this. Where to find more information Centers for Disease Control and Prevention (CDC): http://www.wolf.info/ U.[place]. Food and Drug Administration (FDA): [place] Get help right away if: Seek medical care right away if you are taking opioids and you, or people close to you, notice any of the following: You have trouble breathing. Your breathing is slower or more shallow than normal. You have a very slow heartbeat. You feel very confused. You pass out (faint). You are very sleepy. Your speech is not normal. You feel sick to your stomach and vomit. You have cold skin. You have blue lips or fingernails. Your muscles are weak (limp) and your body seems floppy. The black centers of your eyes (pupils) are smaller than normal. If  you think that you or someone else may have taken too much of an opioid medicine, get medical help right away. Call your local emergency services (911 in the [place] Do not drive yourself to the hospital. If you ever feel like you may hurt yourself or others, or have thoughts about taking your own life, get help right away. You can go to your nearest emergency department or call: Your local emergency services (911 in the [place] The hotline of the [facility] 9072977350 in the [place] A suicide crisis helpline, such as the [facility] at 2508526910. This is open 24 hours a day. Summary Opioid are strong medicines that are used to treat bad or very bad pain. A pain treatment plan is a plan made by you and your doctor. Work with your doctor to make a plan for treating your pain. Work with your doctor to find other ways to help your pain. If you think that you or someone else may have taken too much of an opioid, get help right  away. This information is not intended to replace advice given to you by your health care provider. Make sure you discuss any questions you have with your healthcare provider. Document Revised: 04/29/2020 Document Reviewed: 06/06/2018 Elsevier Patient Education  [place].

## 2020-12-28 NOTE — Progress Notes (Signed)
Subjective:   James Hood is a 80 y.o. male who presents for Medicare Annual/Subsequent preventive examination.  Review of Systems    No ROS.  Medicare Wellness   Cardiac Risk Factors include: male gender;advanced age (>44mn, >>72women);diabetes mellitus     Objective:    Today's Vitals   12/28/20 0950  BP: 99/65  Pulse: 69  Resp: 15  Temp: (!) 96.3 F (35.7 C)  Weight: 167 lb 6.4 oz (75.9 kg)  Height: '5\' 10"'$  (1.778 m)   Body mass index is 24.02 kg/m.  Advanced Directives 12/28/2020 12/13/2020 11/15/2020 08/25/2020 07/27/2020 05/30/2020 02/04/2020  Does Patient Have a Medical Advance Directive? Yes Yes Yes Yes Yes No Yes  Type of AParamedicof [place]iber[place]Living will Living will;Healthcare Power of [place]Living will - -  Does patient want to make changes to medical advance directive? - - No - Patient declined No - Patient declined - - -  Copy of [place]? Yes - validated most recent copy scanned in chart (See row information) - - - - - -  Would patient like information on creating a medical advance directive? - No - Patient declined - - - No - Patient declined -    Current Medications (verified) Outpatient Encounter Medications as of 12/28/2020  Medication Sig   aspirin EC 81 MG tablet Take 81 mg by mouth daily.   budesonide (ENTOCORT EC) 3 MG 24 hr capsule Take 6 mg by mouth daily.   buPROPion (WELLBUTRIN XL) 300 MG 24 hr tablet Take 1 tablet (300 mg total) by mouth daily.   Continuous Blood Gluc Sensor (FREESTYLE LIBRE 14 DAY SENSOR) MISC APPLY ONE SENSOR EVERY 14 DAYS TO MONITOR BLOOD GLUCOSE LEVELS. REMOVE OLD SENSOR BEFORE APPLYING NEW SENSOR   dapagliflozin propanediol (FARXIGA) 10 MG TABS tablet Complete supply of Jardiance and then start Farxiga 10 mg daily   diphenoxylate-atropine (LOMOTIL) 2.5-0.025 MG tablet Take 1 tablet by mouth in the morning and at bedtime.   divalproex  (DEPAKOTE ER) 500 MG 24 hr tablet Take 1 tablet by mouth 2 (two) times daily.   donepezil (ARICEPT) 10 MG tablet Take 10 mg by mouth at bedtime.   fenofibrate (TRICOR) 145 MG tablet TAKE ONE TABLET BY MOUTH DAILY   gabapentin (NEURONTIN) 600 MG tablet TAKE TWO TABLETS BY MOUTH THREE TIMES A DAY   glimepiride (AMARYL) 4 MG tablet Take 4 mg by mouth daily with breakfast.   HYDROcodone-acetaminophen (NORCO) 10-325 MG tablet Take 1 tablet by mouth every 4 (four) hours as needed for severe pain. Must last 30 days.   [START ON 01/22/2021] HYDROcodone-acetaminophen (NORCO) 10-325 MG tablet Take 1 tablet by mouth every 4 (four) hours as needed for severe pain. Must last 30 days.   Hyoscyamine Sulfate SL 0.125 MG SUBL Place under the tongue.   lisinopril-hydrochlorothiazide (ZESTORETIC) 20-25 MG tablet Take 1 tablet by mouth daily.   memantine (NAMENDA) 10 MG tablet Take 1 tablet by mouth daily.   metFORMIN (GLUCOPHAGE) 1000 MG tablet Take 1,000 mg by mouth 2 (two) times daily with a meal.   metoprolol succinate (TOPROL-XL) 25 MG 24 hr tablet Take 25 mg by mouth daily.   mirabegron ER (MYRBETRIQ) 50 MG TB24 tablet Take 1 tablet (50 mg total) by mouth daily.   Multiple Vitamins-Minerals (CENTRUM SILVER PO) Take 1 tablet by mouth daily.   naloxegol oxalate (MOVANTIK) 12.5 MG TABS tablet Take 1 tablet (12.5 mg  total) by mouth daily. Do not break tablet. Take on empty stomach 1 hour before or 2 hours after a meal.   omega-3 acid ethyl esters (LOVAZA) 1 g capsule Take 2 capsules (2 g total) by mouth 2 (two) times daily.   omeprazole (PRILOSEC) 20 MG capsule Take 20 mg by mouth 2 (two) times daily before a meal.    primidone (MYSOLINE) 50 MG tablet Take 150-250 mg by mouth 2 (two) times daily. Take 250 mg by mouth in the morning & take 150 mg at night.   propranolol (INDERAL) 60 MG tablet Take 60 mg by mouth 2 (two) times daily.   QUEtiapine (SEROQUEL) 100 MG tablet Take 150 mg by mouth at bedtime.    Semaglutide,0.25 or 0.'5MG'$ /DOS, (OZEMPIC, 0.25 OR 0.5 MG/DOSE,) 2 MG/1.5ML SOPN Inject 0.5 mg into the skin once a week.   simvastatin (ZOCOR) 40 MG tablet Take 40 mg by mouth daily.   tamsulosin (FLOMAX) 0.4 MG CAPS capsule TAKE ONE CAPSULE BY MOUTH DAILY AFTER DINNER   traZODone (DESYREL) 100 MG tablet 100-150 mg at night as needed for sleep   Vibegron (GEMTESA) 75 MG TABS Take 1 tablet by mouth daily.   No facility-administered encounter medications on file as of 12/28/2020.    Allergies (verified) Patient has no known allergies.   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 ENDOSCOPY;  Service: Endoscopy;  Laterality: N/A;   esophageal stretch     ESOPHAGOGASTRODUODENOSCOPY (EGD) WITH PROPOFOL N/A 12/13/2020   Procedure: ESOPHAGOGASTRODUODENOSCOPY (EGD) WITH PROPOFOL;  Surgeon: Lesly Rubenstein, MD;  Location:  [facility] ENDOSCOPY;  Service: Endoscopy;  Laterality: N/A;  IDDM   JOINT REPLACEMENT Right 2016   knee   LUMBAR LAMINECTOMY/DECOMPRESSION MICRODISCECTOMY Left 02/03/2016   Procedure: LEFT L5-S1 DISKECTOMY;  Surgeon: Leeroy Cha, MD;  Location: [place] NEURO ORS;  Service: Neurosurgery;  Laterality: Left;  LEFT L5-S1 DISKECTOMY   PULSE GENERATOR IMPLANT N/A 08/21/2017   Procedure: UNILATERAL PULSE GENERATOR IMPLANT;  Surgeon: Meade Maw, MD;  Location: [facility] ORS;  Service: Neurosurgery;  Laterality: N/A;   PULSE GENERATOR IMPLANT Right 04/02/2018   Procedure: REMOVAL OF PULSE GENERATOR IMPLANT AND LEADS;  Surgeon: Meade Maw, MD;  Location: [facility] ORS;  Service: Neurosurgery;  Laterality: Right;   TONSILLECTOMY     TOTAL  KNEE ARTHROPLASTY Right 11/15/2014   Procedure: TOTAL KNEE ARTHROPLASTY;  Surgeon: Elsie Saas, MD;  Location: [facility];  Service: Orthopedics;  Laterality: Right;   Family History  Problem Relation Age of Onset   Heart attack Mother    Heart disease Mother    Hypertension Father    Depression Father    Kidney cancer Neg Hx    Prostate cancer Neg Hx    Social History   Socioeconomic History   Marital status: Married    Spouse name: Not on file   Number of children: Not on file   Years of education: Not on file   Highest education level: Not on file  Occupational History   Not on file  Tobacco Use   Smoking status: Every Day    Packs/day: 0.50    Years: 60.00    Pack years: 30.00    Types: Cigarettes   Smokeless tobacco: Never   Tobacco comments:    Last one at 0400  Vaping Use   Vaping Use: Former  Substance and Sexual Activity   Alcohol use: Not Currently    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   Drug use: Never   Sexual activity: Not Currently  Other Topics Concern   Not on file  Social History Narrative   Not on file   Social Determinants of Health   Financial Resource Strain: Medium Risk   Difficulty of Paying Living Expenses:  Somewhat hard  Food Insecurity: Not on file  Transportation Needs: Not on file  Physical Activity: Insufficiently Active   Days of Exercise per Week: 3 days   Minutes of Exercise per Session: 40 min  Stress: Not on file  Social Connections: Not on file    Tobacco Counseling Ready to quit: Not Answered Counseling given: Not Answered Tobacco comments: Last one at 0400   Clinical Intake:  Pre-visit preparation completed: Yes        Diabetes: Yes  How often do you need to have someone help you when you read instructions, pamphlets, or other written materials from your doctor or pharmacy?: 1 - Never   Interpreter Needed?: No      Activities of Daily Living In your present state of health, do you have any difficulty performing the following activities: 12/28/2020 05/31/2020  Hearing? N N  Vision? N N  Difficulty concentrating or making decisions? N Y  Walking or climbing stairs? Y Y  Dressing or bathing? N N  Doing errands, shopping? N Y  Conservation officer, nature and eating ? N -  Using the Toilet? N -  In the past six months, have you accidently leaked urine? N -  Do you have problems with loss of bowel control? N -  Managing your Medications? N -  Managing your Finances? N -  Housekeeping or managing your Housekeeping? N -  Some recent data might be hidden    Patient Care Team: Leone Haven, MD as PCP - General (Family Medicine) De Hollingshead, RPH-CPP as Pharmacist (Pharmacist)  Indicate any recent Medical Services you may have received from other than [facility] providers in the past year (date may be approximate).     Assessment:   This is a routine wellness examination for Malyke.  Hearing/Vision screen Hearing Screening - Comments:: Difficulty hearing conversational tones in a crowded setting. He does not wear hearing aids. Audiology deferred in the last year per patient preference.  Vision Screening - Comments:: Followed by [facility]  Wears  corrective lenses  Diabetic exam; no retinopathy reported  ey have regular follow up with the ophthalmologist  Dietary issues and exercise activities discussed: Current Exercise Habits: Home exercise routine, Intensity: Mild   Goals Addressed               This Visit's Progress     Patient Stated     Healthy Lifestyle (pt-stated)        Stay active Healthy diet Good fluid intake      COMPLETED: Increase physical activity (pt-stated)        Upper and lower body strengthening exercises       Depression Screen PHQ 2/9 Scores 12/28/2020 07/27/2020 05/09/2020 03/01/2020 02/05/2020 12/01/2019 10/06/2019  PHQ - 2 Score 0 0 0 0 0 0 1  PHQ- 9 Score - - - - - - -  Exception Documentation - - - - - - -  Some encounter information is confidential and restricted. Go to Review Flowsheets activity to see all data.    Fall Risk Fall Risk  12/28/2020 11/15/2020 07/27/2020 05/26/2020 05/09/2020  Falls in the past year? 1 1 0 1 1  Number falls in past yr: 1 0 - 1 1  Comment - tripped with new shoes on carpet, slipped down 3 steps and scraped left arm, per pt. - - -  Injury with Fall? 0 - - 0 0  Comment - - - - -  Risk Factor Category  - - - - -  Risk for fall due to : History of fall(s) - - - -  Follow up - - - - Falls evaluation completed  Comment - - - - -    FALL RISK PREVENTION PERTAINING TO THE HOME: Adequate lighting in your home to reduce risk of falls? Yes   ASSISTIVE DEVICES UTILIZED TO PREVENT FALLS: Life alert? Yes  Use of a cane, walker or w/c? No    TIMED UP AND GO: Was the test performed? Yes .  Length of time to ambulate 10 feet: 15 sec.   Gait slow and steady without use of assistive device  Cognitive Function: MMSE - Mini Mental State Exam 11/15/2017 11/14/2016  Orientation to time 5 5  Orientation to Place 5 5  Registration 3 3  Attention/ Calculation 5 5  Recall 3 3  Language- name 2 objects 2 2  Language- repeat 1 1  Language- follow 3 step command 3 3   Language- read & follow direction 1 1  Write a sentence 1 1  Copy design 1 1  Total score 30 30     6CIT Screen 12/01/2019 11/28/2018  What Year? 0 points 0 points  What month? 0 points 0 points  What time? - 0 points  Count back from 20 - 0 points  Months in reverse 0 points 0 points  Repeat phrase 0 points 0 points  Total Score - 0    Immunizations Immunization History  Administered Date(s) Administered   Fluad Quad(high Dose 65+) 02/05/2020   Hepatitis A, Adult 12/02/2018   Influenza, High Dose Seasonal PF 01/29/2017, 03/19/2018, 01/10/2019   Influenza,inj,Quad PF,6+ Mos 03/13/2016   Influenza-Unspecified 01/21/2015   PFIZER(Purple Top)SARS-COV-2 Vaccination 06/01/2019, 06/22/2019, 03/09/2020, 08/02/2020   Pneumococcal Conjugate-13 02/07/2015   Pneumococcal Polysaccharide-23 01/18/2018   Tdap 09/16/2015, 01/18/2018   Zoster Recombinat (Shingrix) 01/03/2018, 05/09/2018   Zoster, Live 09/16/2015   Health Maintenance Health Maintenance  Topic Date Due   FOOT EXAM  10/25/2020   COVID-19 Vaccine (5 - Booster for [place] series) 01/13/2021 (  Originally 12/02/2020)   INFLUENZA VACCINE  02/13/2021 (Originally 12/19/2020)   OPHTHALMOLOGY EXAM  01/26/2021   HEMOGLOBIN A1C  03/10/2021   TETANUS/TDAP  01/19/2028   PNA vac Low Risk Adult  Completed   Zoster Vaccines- Shingrix  Completed   HPV VACCINES  Aged Out    Hepatitis C Screening: does not qualify  Vision Screening: Recommended annual ophthalmology exams for early detection of glaucoma and other disorders of the eye.  Dental Screening: Recommended annual dental exams for proper oral hygiene. Partial.   Community Resource Referral / Chronic Care Management: CRR required this visit?  No   CCM required this visit?  No      Plan:     I have personally reviewed and noted the following in the patient's chart:   Medical and social history Use of alcohol, tobacco or illicit drugs  Current medications and supplements  including opioid prescriptions. Patient is currently taking opioid prescriptions. Information provided to patient regarding non-opioid alternatives. Patient advised to discuss non-opioid treatment plan with their provider. Followed by pain clinic.  Functional ability and status Nutritional status Physical activity Advanced directives List of other physicians Hospitalizations, surgeries, and ER visits in previous 12 months Vitals Screenings to include cognitive, depression, and falls Referrals and appointments  In addition, I have reviewed and discussed with patient certain preventive protocols, quality metrics, and best practice recommendations. A written personalized care plan for preventive services as well as general preventive health recommendations were provided to patient.     Varney Biles, LPN   [id number]

## 2021-01-03 ENCOUNTER — Ambulatory Visit: Payer: PPO | Admitting: Podiatry

## 2021-01-03 ENCOUNTER — Other Ambulatory Visit: Payer: Self-pay

## 2021-01-03 DIAGNOSIS — B351 Tinea unguium: Secondary | ICD-10-CM

## 2021-01-03 DIAGNOSIS — E119 Type 2 diabetes mellitus without complications: Secondary | ICD-10-CM

## 2021-01-03 DIAGNOSIS — M79674 Pain in right toe(s): Secondary | ICD-10-CM

## 2021-01-03 DIAGNOSIS — M79675 Pain in left toe(s): Secondary | ICD-10-CM | POA: Diagnosis not present

## 2021-01-03 NOTE — Progress Notes (Signed)
   SUBJECTIVE Patient with a history of diabetes mellitus presents to office today complaining of elongated, thickened nails that cause pain while ambulating in shoes.  Patient is unable to trim their own nails. Patient is here for further evaluation and treatment.   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 Derm Skin is dry and supple bilateral. Negative open lesions or macerations. Remaining integument unremarkable. Nails are tender, long, thickened and dystrophic with subungual debris, consistent with onychomycosis, 1-5 bilateral. No signs of infection noted. Vasc  DP and PT pedal pulses palpable bilaterally. Temperature gradient within normal limits.  Neuro Epicritic and protective threshold sensation diminished bilaterally.  Musculoskeletal Exam No symptomatic pedal deformities noted bilateral. Muscular strength within normal limits.  ASSESSMENT 1. Diabetes Mellitus w/ peripheral neuropathy 2.  Pain due to onychomycosis of toenails bilateral  PLAN OF CARE 1. Patient evaluated today.  Comprehensive diabetic foot exam performed today. 2. Instructed to maintain good pedal hygiene and foot care. Stressed importance of  controlling blood sugar.  3. Mechanical debridement of nails 1-5 bilaterally performed using a nail nipper. Filed with dremel without incident.  4. Return to clinic in 3 mos.     Edrick Kins, DPM [facility]  Dr. Edrick Kins, DPM    [address], [place] [address]                Office [phone]  Fax [phone]

## 2021-01-10 ENCOUNTER — Ambulatory Visit: Payer: PPO | Admitting: Pharmacist

## 2021-01-10 DIAGNOSIS — E1142 Type 2 diabetes mellitus with diabetic polyneuropathy: Secondary | ICD-10-CM

## 2021-01-10 DIAGNOSIS — N1831 Chronic kidney disease, stage 3a: Secondary | ICD-10-CM

## 2021-01-10 DIAGNOSIS — E782 Mixed hyperlipidemia: Secondary | ICD-10-CM

## 2021-01-10 NOTE — Chronic Care Management (AMB) (Signed)
Chronic Care Management Pharmacy Note  01/10/2021 Name:  James Hood MRN:  [id number] DOB:  06-16-1940   Subjective: James Hood is an 80 y.o. year old male who is a primary patient of Caryl Bis, Angela Adam, MD.  The CCM team was consulted for assistance with disease management and care coordination needs.    Engaged with patient by telephone for  medication question  in response to provider referral for pharmacy case management and/or care coordination services.   Consent to Services:  The patient was given information about Chronic Care Management services, agreed to services, and gave verbal consent prior to initiation of services.  Please see initial visit note for detailed documentation.   Patient Care Team: Leone Haven, MD as PCP - General (Family Medicine) De Hollingshead, RPH-CPP as Pharmacist (Pharmacist)   Objective:  Lab Results  Component Value Date   CREATININE 1.11 10/11/2020   CREATININE 1.47 09/08/2020   CREATININE 1.21 07/20/2020    Lab Results  Component Value Date   HGBA1C 7.1 (H) 09/08/2020   Last diabetic Eye exam:  Lab Results  Component Value Date/Time   HMDIABEYEEXA No Retinopathy 01/27/2020 03:37 PM    Last diabetic Foot exam: No results found for: HMDIABFOOTEX      Component Value Date/Time   CHOL 99 07/05/2020 1209   TRIG 226.0 (H) 07/05/2020 1209   HDL 43.20 07/05/2020 1209   CHOLHDL 2 07/05/2020 1209   VLDL 45.2 (H) 07/05/2020 1209   LDLCALC 57 09/25/2017 0924   LDLDIRECT 31.0 07/05/2020 1209    Hepatic Function Latest Ref Rng & Units 10/11/2020 06/08/2020 05/31/2020  Total Protein 6.0 - 8.3 g/dL 6.2 6.9 5.8(L)  Albumin 3.5 - 5.2 g/dL 4.1 3.3(L) 2.8(L)  AST 0 - 37 U/L 15 29 19   ALT 0 - 53 U/L 11 19 14   Alk Phosphatase 39 - 117 U/L 41 58 32(L)  Total Bilirubin 0.2 - 1.2 mg/dL 0.3 0.8 0.7  Bilirubin, Direct 0.0 - 0.3 mg/dL - - -    Lab Results  Component Value Date/Time   TSH 0.97 10/11/2020 10:08 AM   TSH  0.346 (L) 05/31/2020 12:34 AM   TSH 1.36 07/20/2019 09:02 AM    CBC Latest Ref Rng & Units 10/11/2020 06/08/2020 06/01/2020  WBC 4.0 - 10.5 K/uL 5.8 6.5 6.7  Hemoglobin 13.0 - 17.0 g/dL 12.6(L) 11.8(L) 10.2(L)  Hematocrit 39.0 - 52.0 % 37.5(L) 35.7(L) 31.0(L)  Platelets 150.0 - 400.0 K/uL 249.0 375 203    Lab Results  Component Value Date/Time   VD25OH 24.57 (L) 10/11/2020 10:08 AM   VD25OH 42.83 04/07/2019 08:54 AM    Clinical ASCVD: No  The ASCVD Risk score Mikey Bussing DC Jr., et al., 2013) failed to calculate for the following reasons:   The 2013 ASCVD risk score is only valid for ages 84 to 62     Social History   Tobacco Use  Smoking Status Every Day   Packs/day: 0.50   Years: 60.00   Pack years: 30.00   Types: Cigarettes  Smokeless Tobacco Never  Tobacco Comments   Last one at 0400   BP Readings from Last 3 Encounters:  12/28/20 99/65  12/13/20 110/61  11/23/20 (!) 114/55   Pulse Readings from Last 3 Encounters:  12/28/20 69  12/13/20 (!) 53  11/23/20 81   Wt Readings from Last 3 Encounters:  12/28/20 167 lb 6.4 oz (75.9 kg)  12/13/20 160 lb (72.6 kg)  11/23/20 164 lb (74.4 kg)  Assessment: Review of patient past medical history, allergies, medications, health status, including review of consultants reports, laboratory and other test data, was performed as part of comprehensive evaluation and provision of chronic care management services.   SDOH:  (Social Determinants of Health) assessments and interventions performed:  SDOH Interventions    Flowsheet Row Most Recent Value  SDOH Interventions   Financial Strain Interventions Other (Comment)  [manufacturer assistance]       CCM Care Plan  No Known Allergies  Medications Reviewed Today     Reviewed by Dia Crawford, LPN (Licensed Practical Nurse) on 12/28/20 at [place]  Med List Status: <None>   Medication Order Taking? Sig Documenting Provider Last Dose Status Informant  aspirin EC 81 MG  tablet [id number] No Take 81 mg by mouth daily. [provider] 12/12/2020 2000 Active            Med Note Vanessa Livingston, SUSAN   Tue Apr 15, 2018  8:47 AM)    budesonide (ENTOCORT EC) 3 MG 24 hr capsule [id number] No Take 6 mg by mouth daily. [provider] 12/12/2020 Active   buPROPion (WELLBUTRIN XL) 300 MG 24 hr tablet [id number] No Take 1 tablet (300 mg total) by mouth daily. Norman Clay, MD 12/12/2020 Active   Continuous Blood Gluc Sensor (FREESTYLE LIBRE 14 DAY SENSOR) MISC [id number] No APPLY ONE SENSOR EVERY 14 DAYS TO MONITOR BLOOD GLUCOSE LEVELS. [place] Leone Haven, MD Taking Active   dapagliflozin propanediol (FARXIGA) 10 MG TABS tablet [id number]  Complete supply of Jardiance and then start Farxiga 10 mg daily Leone Haven, MD  Active   diphenoxylate-atropine (LOMOTIL) 2.5-0.025 MG tablet [id number] No Take 1 tablet by mouth in the morning and at bedtime. [provider] 12/12/2020 Active   divalproex (DEPAKOTE ER) 500 MG 24 hr tablet [id number] No Take 1 tablet by mouth 2 (two) times daily. [provider] 12/12/2020 Active   donepezil (ARICEPT) 10 MG tablet [id number] No Take 10 mg by mouth at bedtime. [provider] 12/12/2020 Active Self  fenofibrate (TRICOR) 145 MG tablet [id number]  TAKE ONE TABLET BY MOUTH DAILY Leone Haven, MD  Active   gabapentin (NEURONTIN) 600 MG tablet [id number] No TAKE TWO TABLETS BY MOUTH THREE TIMES A DAY Leone Haven, MD 12/13/2020 0400 Active   glimepiride (AMARYL) 4 MG tablet [id number] No Take 4 mg by mouth daily with breakfast. [provider] 12/12/2020 Active   HYDROcodone-acetaminophen (NORCO) 10-325 MG tablet [id number] No Take 1 tablet by mouth every 4 (four) hours as needed for severe pain. Must last 30 days. [name], MD Taking Active   HYDROcodone-acetaminophen [facility]) 10-325 MG tablet [id number] No Take 1 tablet by mouth every 4 (four) hours as  needed for severe pain. Must last 30 days. [name], MD Taking Active   Hyoscyamine Sulfate SL 0.125 MG SUBL [id number] No Place under the tongue. [provider] 12/12/2020 Active   lisinopril-hydrochlorothiazide (ZESTORETIC) 20-25 MG tablet [id number] No Take 1 tablet by mouth daily. Leone Haven, MD 12/12/2020 Active   memantine (NAMENDA) 10 MG tablet [id number] No Take 1 tablet by mouth daily. [provider] 12/12/2020 Active   metFORMIN (GLUCOPHAGE) 1000 MG tablet [id number] No Take 1,000 mg by mouth 2 (two) times daily with a meal. [provider] 12/12/2020 Active   metoprolol succinate (TOPROL-XL) 25 MG 24 hr tablet [id number] No Take 25 mg by mouth daily. [provider] 12/12/2020 Active   mirabegron  ER (MYRBETRIQ) 50 MG TB24 tablet [id number] No Take 1 tablet (50 mg total) by mouth daily. Billey Co, MD 12/12/2020 Active   Multiple Vitamins-Minerals (CENTRUM SILVER PO) [id number] No Take 1 tablet by mouth daily. [provider] 12/12/2020 Active   naloxegol oxalate (MOVANTIK) 12.5 MG TABS tablet [id number] No Take 1 tablet (12.5 mg total) by mouth daily. Do not break tablet. Take on empty stomach 1 hour before or 2 hours after a meal. [name], MD 12/12/2020 Active   omega-3 acid ethyl esters (LOVAZA) 1 g capsule [id number] No Take 2 capsules (2 g total) by mouth 2 (two) times daily. Leone Haven, MD 12/12/2020 Active   omeprazole (PRILOSEC) 20 MG capsule [id number] No Take 20 mg by mouth 2 (two) times daily before a meal.  [provider] 12/12/2020 Active Self           Med Note (CHAMBERS, KIMBERLY   Thu Aug 09, 2016 12:05 PM)    primidone (MYSOLINE) 50 MG tablet [id number] No Take 150-250 mg by mouth 2 (two) times daily. Take 250 mg by mouth in the morning & take 150 mg at night. [provider] 12/12/2020 Active Self  propranolol (INDERAL) 60 MG tablet [id number] No Take 60 mg by mouth 2 (two) times daily. [provider] 12/12/2020 Active   QUEtiapine (SEROQUEL) 100 MG tablet [id number] No Take 150 mg by mouth at bedtime. [provider] 12/12/2020 Active Pharmacy Records  Semaglutide,0.25 or 0.5MG/DOS, (OZEMPIC, 0.25 OR 0.5 MG/DOSE,) 2 MG/1.5ML SOPN [id number] No Inject 0.5 mg into the skin once a week. Leone Haven, MD 12/10/2020 Active Pharmacy Records  simvastatin (ZOCOR) 40 MG tablet [id number] No Take 40 mg by mouth daily. [provider] 12/12/2020 Active   tamsulosin (FLOMAX) 0.4 MG CAPS capsule [id number] No TAKE ONE CAPSULE BY MOUTH DAILY AFTER Burnett Harry, MD 12/12/2020 Active   traZODone (DESYREL) 100 MG tablet [id number] No 100-150 mg at night as needed for sleep Norman Clay, MD 12/12/2020 Active   Vibegron (GEMTESA) 75 MG TABS [id number] No Take 1 tablet by mouth daily. 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   Right leg pain 02/05/2020   Impingement of left ankle joint    Sleeping difficulty 08/24/2019   Hiccups 08/24/2019   Chronic pain of left ankle 02/27/2019   Vitamin D deficiency 02/18/2019   Left shoulder pain 02/02/2019   Neuropathy 09/24/2018   Lumbosacral radiculopathy 01/14/2018   Primary osteoarthritis of left ankle 01/14/2018   Facet arthritis of lumbar region 01/14/2018   Chronic pain syndrome 08/26/2017   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   Thyroid nodule 02/27/2017   Fatty liver 02/27/2017   Purpura ([place]) 01/29/2017   Right hip pain 08/16/2016   Lumbar herniated disc  02/03/2016   Hypertriglyceridemia 12/09/2015   Anemia 12/09/2015   Falls 09/16/2015   Barrett's esophagus 09/16/2015   Chronic lumbar pain 09/16/2015   Benign prostatic hyperplasia 09/16/2015   Trochanteric bursitis of left hip 09/09/2015  Headache 09/09/2015   Difficulty in walking 06/01/2015   Amnesia 06/01/2015   HLD (hyperlipidemia) 02/23/2015   DJD (degenerative joint disease) of knee 11/15/2014   Hypertension    Tremor, essential    Primary localized osteoarthritis of right knee    Anxiety and depression    Absence of sensation 11/18/2013   Tobacco abuse 11/18/2013   Chronic diarrhea 11/12/2013   Benign neoplasm of colon 08/25/2013   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   Type 2 diabetes mellitus ([place]) 10/06/2012    Immunization History  Administered Date(s) Administered   Fluad Quad(high Dose 65+) 02/05/2020   Hepatitis A, Adult 12/02/2018   Influenza, High Dose Seasonal PF 01/29/2017, 03/19/2018, 01/10/2019   Influenza,inj,Quad PF,6+ Mos 03/13/2016   Influenza-Unspecified 01/21/2015   PFIZER(Purple Top)SARS-COV-2 Vaccination 06/01/2019, 06/22/2019, 03/09/2020, 08/02/2020   Pneumococcal Conjugate-13 02/07/2015   Pneumococcal Polysaccharide-23 01/18/2018   Tdap 09/16/2015, 01/18/2018   Zoster Recombinat (Shingrix) 01/03/2018, 05/09/2018   Zoster, Live 09/16/2015    Conditions to be addressed/monitored: HTN, HLD, and DMII  Care Plan : Medication Management  Updates made by De Hollingshead, RPH-CPP since 01/10/2021 12:00 AM     Problem: Diabetes, Depression      Long-Range Goal: Disease Progression Prevention   Start Date: 02/16/2020  This Visit's Progress: On track  Recent Progress: On track  Priority: Medium  Note:   Current Barriers:  Unable to independently afford treatment regimen  Pharmacist Clinical Goal(s):  Over the next 30 days, patient will verbalize ability to afford treatment regimen through collaboration with PharmD and provider.  Over  the next 90 days, patient will maintain glycemic control as evidenced by A1c through collaboration with PharmD and provider.  Interventions: 1:1 collaboration with Leone Haven, MD regarding development and update of comprehensive plan of care as evidenced by provider attestation and co-signature Inter-disciplinary care team collaboration (see longitudinal plan of care) Comprehensive medication review performed; medication list updated in electronic medical record  Health Maintenance: Due for foot exam. Placed in appointment notes for next provider visit.   SDOH: Will revisit tobacco use at next appointment  Diabetes: Controlled per more relaxed goal of A1c <7.5%; current treatment: Jardiance 25 mg daily- completing supply of Jardiance and then starting Farxiga 10 mg daily; Ozempic 0.5 mg weekly (max tolerated dose d/t GI upset/weight loss at 1 mg weekly) Approved for Ozempic and [place] assistance.  Reports [facility] nurse reported POCT A1c of 6.2% a few weeks ago. Hx metformin ER - significant diarrhea that resolved upon discontinuation Calls today to report that he received his supply of [place], and wanted to confirm that it is to replace the Jardiance. Reviewed this with patient.   Hypertension: Controlled per last clinic reading; current treatment: lisinopril/HCTZ 20/25 mg daily, metoprolol succinate 25 mg dail Previously recommended to continue current regimen at this time  Hyperlipidemia: Controlled; current treatment: rosuvastatin 40 mg daily, fenofibrate 145 mg daily, OTC omega 3 fatty acids 2 g BID Generic Lovaza and Vascepa too expensive. [place] funding closed.  Previously recommended to continue current regimen at this time  Depression/insomnia: Improved. Current regimen: bupropion XL 300 mg daily, trazodone 100-150 mg QPM. Follows w/ Dr. Modesta Messing and LCSW Psychiatry previously discussed reducing dose of quetiapine to reduce risk of EPS. Patient  declined.  Previously recommended to continue current collaboration with psychiatry.   Chronic Pain w/ recent ankle surgery: Improved per patient report; hydrocodone/APAP 10/325 mg up to TID, gabapentin 1200 mg TID. Follows w/ Dr. Holley Raring On Movantik 12.5 mg daily  for opioid induced constipation. Reports now that he is having to manually disimpact himself.  Previously encouraged to continue to discuss balance of constipation vs prevention of episodes of fecal urgency/incontinence w/ pain management  Neurologic Conditions (pseudo dementia, tremor, sleep disorder) Well managed per patient report; current regimen: memantine 10 mg BID, donepezil 10 mg daily; primidone 250 mg QAM, 150 mg QPM; divalproex 500 mg BID, quetiapine 150 mg QPM; Follows w/ Dr. Melrose Nakayama.  Previously recommended to continue current regimen at this time along with collaboration w/ Neurology  Chronic Diarrhea/Colitis, Barrett's: Overly controlled per patient report; current regimen: budesonide 6 mg daily, Lomotil 1 tab QAM- taking BID currently, hyoscyamine PRN added at last visit; follows w/ [facility] Barrett's: omeprazole 20 mg BID Previously discussed to try reducing Lomotil to 1 tab QAM as noted on last GI AVS rather than BID to see if improvement in constipation. Continue to collaborate with GI, Pain Management to balance chronic diarrhea/colitis and opioid induced constipation.   OAB/BPH: Improved; current treatment regimen: Myrbetriq 50 mg daily, tamsulosin 0.4 mg daily; follows w/ Dr. Diamantina Providence Previously recommended to continue current regimen at this time    Patient Goals/Self-Care Activities Over the next 90 days, patient will:  - take medications as prescribed check glucose at least 3 times daily using CGM, document, and provide at future appointments collaborate with provider on medication access solutions engage in dietary modifications by reducing carbohydrate portion sizes Focus on tobacco cessation  Follow Up  Plan: Telephone follow up appointment with care management team member scheduled for: ~10 weeks as previously scheduled     Medication Assistance:  Ozempic obtained through [employer] medication assistance program.  Enrollment ends 04/19/21.  [name] obtained through [employer] medication assistance program. Enrollment ends 05/20/21  Patient's preferred pharmacy is:  [facility] [name], [place] [address] Phone: [phone] Fax: [phone]  [id number] [name], [place] - [address] [place] [address] Phone: [phone] Fax: [phone]  [facility] [address], [place] - [place] [address] Phone: [phone] Fax: [phone]  [facility], [address] [place] [address] Phone: [phone] Fax: [phone]    Follow Up:  Patient agrees to Care Plan and Follow-up.  Plan: Telephone follow up appointment with care management team member scheduled for:  ~ 10 weeks as previously scheduled  Catie Darnelle Maffucci, PharmD, [place], [place] Clinical Pharmacist [employer] [facility] [employer] [phone]

## 2021-01-10 NOTE — Patient Instructions (Signed)
Visit Information  PATIENT GOALS:  Goals Addressed               This Visit's Progress     Patient Stated     Medication Monitoring (pt-stated)        Patient Goals/Self-Care Activities Over the next 90 days, patient will:  - take medications as prescribed check glucose at least 3 times daily using CGM, document, and provide at future appointments collaborate with provider on medication access solutions engage in dietary modifications by reducing carbohydrate portion sizes Focus on tobacco cessation        Patient verbalizes understanding of instructions provided today and agrees to view in [place].   Plan: Telephone follow up appointment with care management team member scheduled for:  ~ 10 weeks as previously scheduled  Catie Darnelle Maffucci, PharmD, [place], [place] Clinical Pharmacist [employer] at [employer] [phone]

## 2021-01-11 ENCOUNTER — Other Ambulatory Visit: Payer: Self-pay

## 2021-01-11 ENCOUNTER — Ambulatory Visit (INDEPENDENT_AMBULATORY_CARE_PROVIDER_SITE_OTHER): Payer: PPO | Admitting: Family Medicine

## 2021-01-11 ENCOUNTER — Encounter: Payer: Self-pay | Admitting: Family Medicine

## 2021-01-11 DIAGNOSIS — I10 Essential (primary) hypertension: Secondary | ICD-10-CM | POA: Diagnosis not present

## 2021-01-11 DIAGNOSIS — J3489 Other specified disorders of nose and nasal sinuses: Secondary | ICD-10-CM | POA: Insufficient documentation

## 2021-01-11 DIAGNOSIS — E1142 Type 2 diabetes mellitus with diabetic polyneuropathy: Secondary | ICD-10-CM | POA: Diagnosis not present

## 2021-01-11 DIAGNOSIS — L989 Disorder of the skin and subcutaneous tissue, unspecified: Secondary | ICD-10-CM | POA: Diagnosis not present

## 2021-01-11 LAB — POCT GLYCOSYLATED HEMOGLOBIN (HGB A1C): Hemoglobin A1C: 6.8 % — AB (ref 4.0–5.6)

## 2021-01-11 MED ORDER — IPRATROPIUM BROMIDE 0.03 % NA SOLN: 2.0000 | Freq: Two times a day (BID) | NASAL | 12 refills | Status: DC

## 2021-01-11 NOTE — Assessment & Plan Note (Signed)
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  Spot on his forehead appears to be a seborrheic keratosis.  We will refer to dermatology for further evaluation.

## 2021-01-11 NOTE — Patient Instructions (Signed)
Nice to see you. Your A1c is well controlled.  Please try to cut down on your carbohydrate portion sizes. Dermatology should contact you to schedule an appointment to evaluate your skin lesions. Please try the Atrovent nasal spray to see if that is beneficial. I do not believe your metformin is part of the most recent recall though you should ask your pharmacist to determine this.

## 2021-01-11 NOTE — Assessment & Plan Note (Signed)
Patient describes gustatory rhinorrhea.  We will trial Atrovent nasal spray to see if this is beneficial.

## 2021-01-11 NOTE — Assessment & Plan Note (Signed)
Well-controlled.  He will continue lisinopril/HCTZ 1 tablet daily.  He is going to confirm whether or not he is taking metoprolol and/or propranolol.  Discussed that he really should not be on both.

## 2021-01-11 NOTE — Progress Notes (Signed)
Tommi Rumps, MD Phone: [phone]  James Hood is a 80 y.o. male who presents today for f/u.  DIABETES Disease Monitoring: Blood Sugar ranges-avg 140s-150s, excursions to the 250s after eating a carb heavy meal Polyuria/phagia/dipsia- some polyuria related to BPH (follows with urology)      Optho- UTD Medications: Compliance- taking farxiga, glimeperide, metformin, ozempic Hypoglycemic symptoms- no Does eat larger portions of carbohydrates at times.  He also notes he just heard about a metformin recall.  HYPERTENSION Disease Monitoring Home BP Monitoring not checking Chest pain- no    Dyspnea- no Medications Compliance-  taking lisinopril/HCTZ, he thinks he may be taking both metoprolol and propranolol.  Edema- no  Gustatory rhinitis: Patient notes rhinorrhea when he eats.  He does have some rhinorrhea when he is not eating though it is mostly when he eats.  No sneezing.  No significant sinus or nasal congestion.  No postnasal drip.  He has tried Afrin on occasion for this.  Skin lesions: Patient notes a rough skin spot on his nose and left cheek.  He also has a possible seborrheic keratosis on his left forehead.  He notes the nose spot gets sore.  The left cheek spot flakes occasionally.  The forehead spot has not changed recently.  He has not seen dermatology.  Social History   Tobacco Use  Smoking Status Every Day   Packs/day: 0.50   Years: 60.00   Pack years: 30.00   Types: Cigarettes  Smokeless Tobacco Never  Tobacco Comments   Last one at 0400    Current Outpatient Medications on File Prior to Visit  Medication Sig Dispense Refill   aspirin EC 81 MG tablet Take 81 mg by mouth daily.     budesonide (ENTOCORT EC) 3 MG 24 hr capsule Take 6 mg by mouth daily.     buPROPion (WELLBUTRIN XL) 300 MG 24 hr tablet Take 1 tablet (300 mg total) by mouth daily. 90 tablet 0   Continuous Blood Gluc Sensor (FREESTYLE LIBRE 14 DAY SENSOR) MISC APPLY ONE SENSOR EVERY 14 DAYS  TO MONITOR BLOOD GLUCOSE LEVELS. REMOVE OLD SENSOR BEFORE APPLYING NEW SENSOR 2 each 1   dapagliflozin propanediol (FARXIGA) 10 MG TABS tablet Complete supply of Jardiance and then start Farxiga 10 mg daily 90 tablet 3   diphenoxylate-atropine (LOMOTIL) 2.5-0.025 MG tablet Take 1 tablet by mouth in the morning and at bedtime.     divalproex (DEPAKOTE ER) 500 MG 24 hr tablet Take 1 tablet by mouth 2 (two) times daily.     donepezil (ARICEPT) 10 MG tablet Take 10 mg by mouth at bedtime.     fenofibrate (TRICOR) 145 MG tablet TAKE ONE TABLET BY MOUTH DAILY 90 tablet 1   gabapentin (NEURONTIN) 600 MG tablet TAKE TWO TABLETS BY MOUTH THREE TIMES A DAY 180 tablet 1   glimepiride (AMARYL) 4 MG tablet Take 4 mg by mouth daily with breakfast.     HYDROcodone-acetaminophen (NORCO) 10-325 MG tablet Take 1 tablet by mouth every 4 (four) hours as needed for severe pain. Must last 30 days. 180 tablet 0   [START ON 01/22/2021] HYDROcodone-acetaminophen (NORCO) 10-325 MG tablet Take 1 tablet by mouth every 4 (four) hours as needed for severe pain. Must last 30 days. 180 tablet 0   Hyoscyamine Sulfate SL 0.125 MG SUBL Place under the tongue.     lisinopril-hydrochlorothiazide (ZESTORETIC) 20-25 MG tablet Take 1 tablet by mouth daily. 90 tablet 1   memantine (NAMENDA) 10 MG tablet Take 1  tablet by mouth daily.     metFORMIN (GLUCOPHAGE) 1000 MG tablet Take 1,000 mg by mouth 2 (two) times daily with a meal.     metoprolol succinate (TOPROL-XL) 25 MG 24 hr tablet Take 25 mg by mouth daily.     mirabegron ER (MYRBETRIQ) 50 MG TB24 tablet Take 1 tablet (50 mg total) by mouth daily. 30 tablet 11   Multiple Vitamins-Minerals (CENTRUM SILVER PO) Take 1 tablet by mouth daily.     naloxegol oxalate (MOVANTIK) 12.5 MG TABS tablet Take 1 tablet (12.5 mg total) by mouth daily. Do not break tablet. Take on empty stomach 1 hour before or 2 hours after a meal. 30 tablet 2   omega-3 acid ethyl esters (LOVAZA) 1 g capsule Take 2  capsules (2 g total) by mouth 2 (two) times daily. 360 capsule 3   omeprazole (PRILOSEC) 20 MG capsule Take 20 mg by mouth 2 (two) times daily before a meal.      primidone (MYSOLINE) 50 MG tablet Take 150-250 mg by mouth 2 (two) times daily. Take 250 mg by mouth in the morning & take 150 mg at night.     propranolol (INDERAL) 60 MG tablet Take 60 mg by mouth 2 (two) times daily.     QUEtiapine (SEROQUEL) 100 MG tablet Take 150 mg by mouth at bedtime.     Semaglutide,0.25 or 0.'5MG'$ /DOS, (OZEMPIC, 0.25 OR 0.5 MG/DOSE,) 2 MG/1.5ML SOPN Inject 0.5 mg into the skin once a week. 1.5 mL 3   simvastatin (ZOCOR) 40 MG tablet Take 40 mg by mouth daily.     tamsulosin (FLOMAX) 0.4 MG CAPS capsule TAKE ONE CAPSULE BY MOUTH DAILY AFTER DINNER 90 capsule 0   traZODone (DESYREL) 100 MG tablet 100-150 mg at night as needed for sleep 135 tablet 0   Vibegron (GEMTESA) 75 MG TABS Take 1 tablet by mouth daily. 30 tablet 11   No current facility-administered medications on file prior to visit.     ROS see history of present illness  Objective  Physical Exam Vitals:   01/11/21 0814  BP: 110/60  Pulse: 64  Temp: 97.8 F (36.6 C)  SpO2: 97%    BP Readings from Last 3 Encounters:  01/11/21 110/60  12/28/20 99/65  12/13/20 110/61   Wt Readings from Last 3 Encounters:  01/11/21 165 lb 3.2 oz (74.9 kg)  12/28/20 167 lb 6.4 oz (75.9 kg)  12/13/20 160 lb (72.6 kg)    Physical Exam Constitutional:      General: He is not in acute distress.    Appearance: He is not diaphoretic.  Cardiovascular:     Rate and Rhythm: Normal rate and regular rhythm.     Heart sounds: Normal heart sounds.  Pulmonary:     Effort: Pulmonary effort is normal.     Breath sounds: Normal breath sounds.  Skin:    General: Skin is warm and dry.     Comments: There is a rough skin spot on the left bridge of his nose, a dime sized rough skin spot on his left cheek, and a centimeter sized seborrheic keratosis on his left  forehead just above his eyebrow  Neurological:     Mental Status: He is alert.     Assessment/Plan: Please see individual problem list.  Problem List Items Addressed This Visit     Hypertension    Well-controlled.  He will continue lisinopril/HCTZ 1 tablet daily.  He is going to confirm whether or not he is taking  metoprolol and/or propranolol.  Discussed that he really should not be on both.      Rhinorrhea    Patient describes gustatory rhinorrhea.  We will trial Atrovent nasal spray to see if this is beneficial.      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  Spot on his forehead appears to be a seborrheic keratosis.  We will refer to dermatology for further evaluation.      Relevant Orders   Ambulatory referral to Dermatology   Type 2 diabetes mellitus ([place])    Check A1c.  He will continue Farxiga 10 mg once daily, glimepiride 4 mg daily, metformin 1000 mg twice daily, and Ozempic 0.5 mg once weekly.  It appears the most recent metformin recall was for extended release metformin though I did advise the patient to ask his pharmacist about this.      Relevant Orders   POCT HgB A1C    Return in about 4 months (around 05/13/2021).  This visit occurred during the SARS-CoV-2 public health emergency.  Safety protocols were in place, including screening questions prior to the visit, additional usage of staff PPE, and extensive cleaning of exam room while observing appropriate contact time as indicated for disinfecting solutions.    Tommi Rumps, MD [facility]

## 2021-01-11 NOTE — Assessment & Plan Note (Addendum)
Check A1c.  He will continue Farxiga 10 mg once daily, glimepiride 4 mg daily, metformin 1000 mg twice daily, and Ozempic 0.5 mg once weekly.  It appears the most recent metformin recall was for extended release metformin though I did advise the patient to ask his pharmacist about this.

## 2021-01-12 ENCOUNTER — Other Ambulatory Visit: Payer: Self-pay | Admitting: Family Medicine

## 2021-01-12 ENCOUNTER — Telehealth: Payer: Self-pay

## 2021-01-12 NOTE — Telephone Encounter (Signed)
It is fine to schedule her with the first available 30-minute slot.  It may be several months before she is able to get into see me though.

## 2021-01-12 NOTE — Telephone Encounter (Signed)
Pt called and asked if Dr Caryl Bis would take on his wife James Hood as a new pt. I told him that Dr Caryl Bis was not taking new patients and he states that he has been seeing Dr Chauncey Cruel for years and would be very upset if he wouldn't take her on. Please advise.

## 2021-01-17 ENCOUNTER — Other Ambulatory Visit: Payer: Self-pay

## 2021-01-17 ENCOUNTER — Encounter: Payer: Self-pay | Admitting: Podiatry

## 2021-01-17 ENCOUNTER — Ambulatory Visit: Payer: PPO | Admitting: Podiatry

## 2021-01-17 DIAGNOSIS — S90425A Blister (nonthermal), left lesser toe(s), initial encounter: Secondary | ICD-10-CM

## 2021-01-17 NOTE — Progress Notes (Signed)
   HPI: 80 y.o. male presenting today for new complaint regarding a superficial blister that developed to the left second toe.  Patient states that about 1 week ago he noticed a blister developed to the left second toe.  Over the past 3 days it has improved significantly.  He has been applying triple antibiotic ointment and a Band-Aid.  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  Dermatology: Skin is warm, dry and supple bilateral lower extremities. Negative for open lesions or macerations.  Very superficial blister lesion that has drained to the dorsum of the DIPJ of the left second digit.  There is no erythema or edema around the area.  No current concerns of inflammatory infection.  Vascular: Palpable pedal pulses bilaterally. No edema or erythema noted. Capillary refill within normal limits.  Neurological: Epicritic and protective threshold grossly intact bilaterally.   Musculoskeletal Exam: No pedal deformities  Assessment: 1.  Superficial blister left second toe; stable with healing   Plan of Care:  1. Patient evaluated.  2.  Silvadene cream provided for the patient to apply daily with a Band-Aid 3.  Reassured the  patient that the blister should resolve over the next week.  If there is concern or if the wound appears to get worse return immediately to the office. 4.  Return to clinic neck scheduled appointment      Edrick Kins, DPM [facility]  Dr. Edrick Kins, DPM    [address], [place] [address]                Office [phone]  Fax [phone]

## 2021-01-18 ENCOUNTER — Ambulatory Visit (INDEPENDENT_AMBULATORY_CARE_PROVIDER_SITE_OTHER): Payer: PPO | Admitting: Licensed Clinical Social Worker

## 2021-01-18 DIAGNOSIS — F3341 Major depressive disorder, recurrent, in partial remission: Secondary | ICD-10-CM

## 2021-01-18 NOTE — Progress Notes (Signed)
   THERAPIST PROGRESS NOTE  Session Time: 8-9a  Participation Level: Active  Behavioral Response: Neat and Well GroomedAlertAnxious  Type of Therapy: Individual Therapy  Treatment Goals addressed: Anxiety  Interventions: CBT and Supportive  Summary: James Hood is a 80 y.o. male who presents with improving symptoms related to depression diagnosis. Pt reports that overall mood has been stable and that he is managing situational stressors and events well. Pt reporting good quality and quantity of sleep. Allowed pt safe space to explore and express thoughts and feelings about recent external stressors and life events. Pt reports that he recently got into verbal altercation with brother over political differences. Allowed pt to share experience and overall psychological impact. Discussed pts thoughts on "state of the world".  Explored family relationships and behavior patterns. Continued recommendations are as follows: self care behaviors, positive social engagements, focusing on overall work/home/life balance, and focusing on positive physical and emotional wellness.    Suicidal/Homicidal: No  Therapist Response: James Hood is able to identify an anxiety coping mechanism that has been successful in the past and increase its use. James Hood is able to verbalize an understanding of how relaxation and diversion activities decrease levels of anxiety. James Hood is exploring anxiety and trauma triggers from the past and understanding the relationship to present anxiety. James Hood is continuing to develop skills to reduce irritability and increase normal social interaction with family and friends. These behaviors are reflective of both personal growth and progress. Treatment to continue as indicated.  Plan: Return again in 4 weeks.  Diagnosis: Axis I: MDD, recurrent, in partial remission    Axis II: No diagnosis    Rachel Bo Ameri Cahoon, LCSW 01/18/2021

## 2021-01-25 ENCOUNTER — Other Ambulatory Visit: Payer: Self-pay

## 2021-01-25 ENCOUNTER — Encounter: Payer: Self-pay | Admitting: Student in an Organized Health Care Education/Training Program

## 2021-01-25 ENCOUNTER — Ambulatory Visit
Payer: PPO | Attending: Student in an Organized Health Care Education/Training Program | Admitting: Student in an Organized Health Care Education/Training Program

## 2021-01-25 DIAGNOSIS — G894 Chronic pain syndrome: Secondary | ICD-10-CM | POA: Diagnosis not present

## 2021-01-25 DIAGNOSIS — M25512 Pain in left shoulder: Secondary | ICD-10-CM

## 2021-01-25 DIAGNOSIS — G8929 Other chronic pain: Secondary | ICD-10-CM

## 2021-01-25 DIAGNOSIS — M19012 Primary osteoarthritis, left shoulder: Secondary | ICD-10-CM

## 2021-01-25 NOTE — Progress Notes (Deleted)
[place] MD/PA/NP OP Progress Note  01/25/2021 3:49 PM James Hood  MRN:  [id number]  Chief Complaint:  HPI: *** Visit Diagnosis: No diagnosis found.  Past Psychiatric History: Please see initial evaluation for full details. I have reviewed the history. No updates at this time.     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[place])    History of hiatal hernia    Hypercholesteremia    Hypertension    Hyperthyroidism    Neuropathic pain    Primary localized osteoarthritis of right knee    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   Stomach ulcer    Tremor, essential    Wound infection complicating hardware ([place]) 04/02/2018    Past Surgical History:  Procedure Laterality Date   ANKLE ARTHROSCOPY Left 09/29/2019   Procedure: LEFT ANKLE ARTHROSCOPY, DEBRIDEMENT;  Surgeon: Newt Minion, MD;  Location: [facility];  Service: Orthopedics;  Laterality: Left;   BACK SURGERY     COLONOSCOPY WITH PROPOFOL N/A 09/14/2016   Procedure: COLONOSCOPY WITH PROPOFOL;  Surgeon: Manya Silvas, MD;  [facility] ENDOSCOPY;  Service: Endoscopy;  Laterality: N/A;   esophageal stretch     ESOPHAGOGASTRODUODENOSCOPY (EGD) WITH PROPOFOL N/A 12/13/2020   Procedure: ESOPHAGOGASTRODUODENOSCOPY (EGD) WITH PROPOFOL;  Surgeon: Lesly Rubenstein, MD;  Location: [facility] ENDOSCOPY;  Service: Endoscopy;  Laterality: N/A;  IDDM   JOINT REPLACEMENT Right 2016   knee   LUMBAR LAMINECTOMY/DECOMPRESSION MICRODISCECTOMY Left 02/03/2016   Procedure: LEFT L5-S1 DISKECTOMY;  Surgeon: Leeroy Cha, MD;   Location: [place] NEURO ORS;  Service: Neurosurgery;  Laterality: Left;  LEFT L5-S1 DISKECTOMY   PULSE GENERATOR IMPLANT N/A 08/21/2017   Procedure: UNILATERAL PULSE GENERATOR IMPLANT;  Surgeon: Meade Maw, MD;  Location: [facility] ORS;  Service: Neurosurgery;  Laterality: N/A;   PULSE GENERATOR IMPLANT Right 04/02/2018   Procedure: REMOVAL OF PULSE GENERATOR IMPLANT AND LEADS;  Surgeon: Meade Maw, MD;  Location: [facility] ORS;  Service: Neurosurgery;  Laterality: Right;   TONSILLECTOMY     TOTAL KNEE ARTHROPLASTY Right 11/15/2014   Procedure: TOTAL KNEE ARTHROPLASTY;  Surgeon: Elsie Saas, MD;  Location: [facility];  Service: Orthopedics;  Laterality: Right;    Family Psychiatric History: Please see initial evaluation for full details. I have reviewed the history. No updates at this time.     Family History:  Family History  Problem Relation Age of Onset   Heart attack Mother    Heart disease Mother    Hypertension Father    Depression Father    Kidney cancer Neg Hx    Prostate cancer Neg Hx     Social History:  Social History   Socioeconomic History   Marital status: Married    Spouse name: Not on file   Number of children: Not on file   Years of education: Not on file   Highest education level: Not on file  Occupational History   Not on file  Tobacco Use   Smoking status: Every Day    Packs/day: 0.50  Years: 60.00    Pack years: 30.00    Types: Cigarettes   Smokeless tobacco: Never   Tobacco comments:    Last one at 0400  Vaping Use   Vaping Use: Former  Substance and Sexual Activity   Alcohol use: Not Currently    Comment: 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   Drug use: Never   Sexual activity: Not Currently  Other Topics Concern   Not on file  Social History Narrative   Not on file   Social Determinants of Health   Financial Resource Strain: High Risk   Difficulty of Paying Living Expenses: Hard  Food Insecurity: Not on file  Transportation Needs: Not on  file  Physical Activity: Not on file  Stress: Not on file  Social Connections: Not on file    Allergies: No Known Allergies  Metabolic Disorder Labs: Lab Results  Component Value Date   HGBA1C 6.8 (A) 01/11/2021   MPG 131 01/25/2016   MPG 166 11/05/2014   No results found for: PROLACTIN Lab Results  Component Value Date   CHOL 99 07/05/2020   TRIG 226.0 (H) 07/05/2020   HDL 43.20 07/05/2020   CHOLHDL 2 07/05/2020   VLDL 45.2 (H) 07/05/2020   LDLCALC 57 09/25/2017   Lab Results  Component Value Date   TSH 0.97 10/11/2020   TSH 0.346 (L) 05/31/2020    Therapeutic Level Labs: No results found for: LITHIUM No results found for: VALPROATE No components found for:  CBMZ  Current Medications: Current Outpatient Medications  Medication Sig Dispense Refill   aspirin EC 81 MG tablet Take 81 mg by mouth daily.     budesonide (ENTOCORT EC) 3 MG 24 hr capsule Take 6 mg by mouth daily.     buPROPion (WELLBUTRIN XL) 300 MG 24 hr tablet Take 1 tablet (300 mg total) by mouth daily. 90 tablet 0   Continuous Blood Gluc Sensor (FREESTYLE LIBRE 14 DAY SENSOR) MISC APPLY ONE SENSOR EVERY 14 DAYS TO MONITOR BLOOD GLUCOSE LEVELS. REMOVE OLD SENSOR NEFORE APPLYING NEW SENSOR 2 each 5   dapagliflozin propanediol (FARXIGA) 10 MG TABS tablet Complete supply of Jardiance and then start Farxiga 10 mg daily 90 tablet 3   diphenoxylate-atropine (LOMOTIL) 2.5-0.025 MG tablet Take 1 tablet by mouth in the morning and at bedtime.     divalproex (DEPAKOTE ER) 500 MG 24 hr tablet Take 1 tablet by mouth 2 (two) times daily.     donepezil (ARICEPT) 10 MG tablet Take 10 mg by mouth at bedtime.     fenofibrate (TRICOR) 145 MG tablet TAKE ONE TABLET BY MOUTH DAILY 90 tablet 1   gabapentin (NEURONTIN) 600 MG tablet TAKE TWO TABLETS BY MOUTH THREE TIMES A DAY 180 tablet 1   glimepiride (AMARYL) 4 MG tablet Take 4 mg by mouth daily with breakfast.     HYDROcodone-acetaminophen (NORCO) 10-325 MG tablet Take 1  tablet by mouth every 4 (four) hours as needed for severe pain. Must last 30 days. 180 tablet 0   Hyoscyamine Sulfate SL 0.125 MG SUBL Place under the tongue.     ipratropium (ATROVENT) 0.03 % nasal spray Place 2 sprays into both nostrils every 12 (twelve) hours. 30 mL 12   lisinopril-hydrochlorothiazide (ZESTORETIC) 20-25 MG tablet Take 1 tablet by mouth daily. 90 tablet 1   memantine (NAMENDA) 10 MG tablet Take 1 tablet by mouth daily.     metFORMIN (GLUCOPHAGE) 1000 MG tablet Take 1,000 mg by mouth 2 (two) times daily with a  meal.     metoprolol succinate (TOPROL-XL) 25 MG 24 hr tablet Take 25 mg by mouth daily.     mirabegron ER (MYRBETRIQ) 50 MG TB24 tablet Take 1 tablet (50 mg total) by mouth daily. 30 tablet 11   Multiple Vitamins-Minerals (CENTRUM SILVER PO) Take 1 tablet by mouth daily.     naloxegol oxalate (MOVANTIK) 12.5 MG TABS tablet Take 1 tablet (12.5 mg total) by mouth daily. Do not break tablet. Take on empty stomach 1 hour before or 2 hours after a meal. 30 tablet 2   omega-3 acid ethyl esters (LOVAZA) 1 g capsule Take 2 capsules (2 g total) by mouth 2 (two) times daily. 360 capsule 3   omeprazole (PRILOSEC) 20 MG capsule Take 20 mg by mouth 2 (two) times daily before a meal.      primidone (MYSOLINE) 50 MG tablet Take 150-250 mg by mouth 2 (two) times daily. Take 250 mg by mouth in the morning & take 150 mg at night.     propranolol (INDERAL) 60 MG tablet Take 60 mg by mouth 2 (two) times daily.     QUEtiapine (SEROQUEL) 100 MG tablet Take 150 mg by mouth at bedtime.     Semaglutide,0.25 or 0.'5MG'$ /DOS, (OZEMPIC, 0.25 OR 0.5 MG/DOSE,) 2 MG/1.5ML SOPN Inject 0.5 mg into the skin once a week. 1.5 mL 3   simvastatin (ZOCOR) 40 MG tablet Take 40 mg by mouth daily.     tamsulosin (FLOMAX) 0.4 MG CAPS capsule TAKE ONE CAPSULE BY MOUTH DAILY AFTER DINNER 90 capsule 0   traZODone (DESYREL) 100 MG tablet 100-150 mg at night as needed for sleep 135 tablet 0   Vibegron (GEMTESA) 75 MG TABS  Take 1 tablet by mouth daily. 30 tablet 11   No current facility-administered medications for this visit.     Musculoskeletal: Strength & Muscle Tone:  N/A Gait & Station:  N/A Patient leans: N/A  Psychiatric Specialty Exam: Review of Systems  There were no vitals taken for this visit.There is no height or weight on file to calculate BMI.  General Appearance: {Appearance:22683}  Eye Contact:  {BHH EYE CONTACT:22684}  Speech:  Clear and Coherent  Volume:  Normal  Mood:  {BHH MOOD:22306}  Affect:  {Affect (PAA):22687}  Thought Process:  Coherent  Orientation:  Full (Time, Place, and Person)  Thought Content: Logical   Suicidal Thoughts:  {ST/HT (PAA):22692}  Homicidal Thoughts:  {ST/HT (PAA):22692}  Memory:  Immediate;   Good  Judgement:  {Judgement (PAA):22694}  Insight:  {Insight (PAA):22695}  Psychomotor Activity:  Normal  Concentration:  Concentration: Good and Attention Span: Good  Recall:  Good  Fund of Knowledge: Good  Language: Good  Akathisia:  No  Handed:  Right  AIMS (if indicated): not done  Assets:  Communication Skills Desire for Improvement  ADL's:  Intact  Cognition: WNL  Sleep:  {BHH GOOD/FAIR/POOR:22877}   Screenings: GAD-7    Health and safety inspector from 12/07/2020 in [facility] Counselor from 11/02/2020 in [facility] from 09/07/2020 in [facility] Office Visit from 09/09/2015 in [facility]  Total GAD-7 Score '3 6 5 6      '$ [place] from 11/15/2017 in [facility] from 11/14/2016 in [facility]  Total Score (max 30 points ) 30 30      PHQ2-9    Flowsheet Row Clinical Support from 12/28/2020 in [facility] Counselor from 12/07/2020 in [facility] Office Visit from 11/14/2020 in [facility] from 11/02/2020 in [facility] from 09/07/2020 in [facility]  PHQ-2 Total Score 0 '1 2 1 2  '$ PHQ-9 Total Score -- '5 3 5 5      '$ Flowsheet Row Counselor from 01/18/2021 in [facility] Admission (Discharged) from 12/13/2020 in [facility] Counselor from 12/07/2020 in [facility] No Risk No Risk No Risk        Assessment and Plan:  DON SAUVAGEAU is a 80 y.o. year old male with a history of pseudo dementia, sleep disorder, tremor, history of QTc prolongation,,, who presents for follow up appointment for below.      1. MDD (major depressive disorder), recurrent, in partial remission ([place]) Although he has occasional depressive symptoms, those are self-limited.  Psychosocial stressors includes current politics, conflict with the wife of his stepson, and demoralization in relate to aging/pain (including not being able to actively engaged in political activity).  He is actively involved in [place], and enjoys the group gathering/family connection.  We will continue bupropion to target depression.  Noted that although it was discussed about inquiring the possibility of lowering the dose of quetiapine, which has been prescribed by another provider, he prefers to stay at the current dose.  Discussed potential risk of EPS.    2. Insomnia, unspecified type He reports significant improvement since up titration of trazodone.  Will continue current dose to target insomnia.    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  Exam is notable for great insight into current politics, and his up bringing.  It is difficult to discern whether his cognitive impairment was attributable to depression, or normal aging.  Will continue to monitor and consider make referral for  neuropsych evaluation if any worsening.    Plan 1. Continue bupropion XR 300 mg daily  2. Continue Trazodone 100-150 mg at night as needed for insomnia 2. Next appointment: 9/12 at 8:30 for 30 mins, in person  - on quetiapine 125 mg at night - qtc 425 msec with iRBBB 06/08/2020 (550 msec on 1/10) - on Donepezil 10 mg qhs, memantine 10 mg 10 mg twice a day - on Gabapentin 600 mg TID , primidone for tremor - on Depakote 500 mg BID for reportedly sleep disorder   The patient demonstrates the following risk factors for suicide: Chronic risk factors for suicide include: psychiatric disorder of depression. Acute risk factors for suicide include: N/A. Protective factors for this patient include: positive social support, responsibility to others (children, family), coping skills and hope for the future. Considering these factors, the overall suicide risk at this point appears to be low. Patient is appropriate for outpatient follow up.    Norman Clay, MD 01/25/2021, 3:49 PM

## 2021-01-25 NOTE — Progress Notes (Signed)
Patient: James Hood  Service Category: E/M  Provider: [name], MD  DOB: 1941/04/12  DOS: 01/25/2021  Location: Office  MRN: [id number]  Setting: Ambulatory outpatient  Referring Provider: Leone Haven, MD  Type: Established Patient  Specialty: Interventional Pain Management  PCP: Leone Haven, MD  Location: Remote location  Delivery: TeleHealth     Virtual Encounter - Pain Management PROVIDER NOTE: Information contained herein reflects review and annotations entered in association with encounter. Interpretation of such information and data should be left to medically-trained personnel. Information provided to patient can be located elsewhere in the medical record under "Patient Instructions". Document created using STT-dictation technology, any transcriptional errors that may result from process are unintentional.    Contact & Pharmacy Preferred: [phone] Home: [phone] (home) Mobile: [phone] (mobile) E-mail: [email or web address] .[email or web address] [name], [place] [address] Phone: [phone] Fax: [phone]  [name], [place] [address] [place] [address] Phone: [phone] Fax: [phone]  [facility] [address], [place] [address] [place] [address] Phone: [phone] Fax: [place] [address] Phone: [phone] Fax: [phone]   Pre-screening  James Hood offered "in-person" vs "virtual" encounter. He indicated preferring virtual for this encounter.   Reason COVID-19*  Social distancing based on CDC and AMA recommendations.   I contacted James Hood on 01/25/2021 via telephone.      I clearly identified myself as [name], MD. I  verified that I was speaking with the correct person using two identifiers (Name: James Hood, and date of birth: [birth year]).  Consent I sought verbal advanced consent from James Hood for virtual visit interactions. I informed James Hood of possible security and privacy concerns, risks, and limitations associated with providing "not-in-person" medical evaluation and management services. I also informed James Hood of the availability of "in-person" appointments. Finally, I informed him that there would be a charge for the virtual visit and that he could be  personally, fully or partially, financially responsible for it. James Hood expressed understanding and agreed to proceed.   Historic Elements   James Hood is a 80 y.o. year old, male patient evaluated today after our last contact on 11/23/2020. 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 He also  has a past surgical history that includes esophageal stretch; Tonsillectomy; Total knee arthroplasty (Right, 11/15/2014); Lumbar laminectomy/decompression microdiscectomy (Left, 02/03/2016); Colonoscopy with propofol (N/A, 09/14/2016); Joint replacement (Right, 2016); Pulse generator implant (N/A, 08/21/2017); Back surgery; Pulse generator implant (Right, 04/02/2018); Ankle arthroscopy (Left, 09/29/2019); and Esophagogastroduodenoscopy (egd) with  propofol (N/A, 12/13/2020). James Hood has a current medication list which includes the following prescription(s): aspirin ec, budesonide, bupropion, freestyle libre 14 day sensor, dapagliflozin propanediol, diphenoxylate-atropine, divalproex, donepezil, fenofibrate, gabapentin, glimepiride, hydrocodone-acetaminophen, hyoscyamine sulfate sl, ipratropium,  lisinopril-hydrochlorothiazide, memantine, metformin, metoprolol succinate, mirabegron er, multiple vitamins-minerals, naloxegol oxalate, omega-3 acid ethyl esters, omeprazole, primidone, propranolol, quetiapine, ozempic (0.25 or 0.5 mg/dose), simvastatin, tamsulosin, trazodone, and gemtesa. He  reports that he has been smoking cigarettes. He has a 30.00 pack-year smoking history. He has never used smokeless tobacco. He reports that he does not currently use alcohol. He reports that he does not use drugs. James Hood has No Known Allergies.   HPI  Today, he is being contacted for worsening of previously known (established) problem  James Hood is endorsing increased left shoulder pain related to left shoulder osteoarthritis and rotator cuff dysfunction.  His pain is worse with shoulder abduction.  He states that he is to go to an orthopedist in [place] for a cortisone injection every 6 to 12 months when his shoulder pain would flareup.  That clinic is no longer open and patient is requesting to have his left shoulder steroid injection done here.  Risk and benefits were reviewed and we will plan on doing this under fluoroscopy, anterior approach.  Laboratory Chemistry Profile   Renal Lab Results  Component Value Date   BUN 32 (H) 10/11/2020   CREATININE 1.11 10/11/2020   GFR 62.63 10/11/2020   GFRAA >60 09/25/2019   GFRNONAA 55 (L) 06/08/2020    Hepatic Lab Results  Component Value Date   AST 15 10/11/2020   ALT 11 10/11/2020   ALBUMIN 4.1 10/11/2020   ALKPHOS 41 10/11/2020   AMMONIA 14 08/09/2016    Electrolytes Lab Results  Component Value  Date   NA 137 10/11/2020   K 4.4 10/11/2020   CL 100 10/11/2020   CALCIUM 9.8 10/11/2020   MG 1.4 (L) 09/08/2020    Bone Lab Results  Component Value Date   VD25OH 24.57 (L) 10/11/2020    Inflammation (CRP: Acute Phase) (ESR: Chronic Phase) Lab Results  Component Value Date   ESRSEDRATE 15 07/20/2019   LATICACIDVEN 1.9 06/08/2020         Note: Above Lab results reviewed.  Imaging  MR Brain Wo Contrast CLINICAL DATA:  Ataxia  EXAM: MRI HEAD WITHOUT CONTRAST  TECHNIQUE: Multiplanar, multiecho pulse sequences of the brain and surrounding structures were obtained without intravenous contrast.  COMPARISON:  None.  FINDINGS: Brain: No acute infarct, mass effect or extra-axial collection. No acute or chronic hemorrhage. There is multifocal hyperintense T2-weighted signal within the white matter. Advanced atrophy for age. The midline structures are normal.  Vascular: Major flow voids are preserved.  Skull and upper cervical spine: Normal calvarium and skull base. Visualized upper cervical spine and soft tissues are normal.  Sinuses/Orbits:No paranasal sinus fluid levels or advanced mucosal thickening. No mastoid or middle ear effusion. Normal orbits.  IMPRESSION: 1. No acute intracranial abnormality. 2. 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  Electronically Signed   By: Ulyses Jarred M.D.   On: 07/12/2020 03:40  Assessment  The primary encounter diagnosis was Primary osteoarthritis of left shoulder. Diagnoses of Chronic left shoulder pain and Chronic pain syndrome were also pertinent to this visit.  Plan of Care  Problem-specific:  No problem-specific Assessment & Plan notes found for this encounter.  James Hood has a current medication list which includes the following long-term medication(s): bupropion, donepezil, fenofibrate, gabapentin, hyoscyamine sulfate sl, ipratropium, lisinopril-hydrochlorothiazide, memantine,  metoprolol succinate, mirabegron er, omega-3 acid ethyl esters, omeprazole, primidone, quetiapine, and trazodone.  Pharmacotherapy (Medications Ordered): No orders of the defined types were placed in this encounter.  Orders:  Orders Placed This Encounter  Procedures   SHOULDER INJECTION    Standing Status:   Future    Standing Expiration Date:   03/27/2021    Scheduling Instructions:     Procedure: Intra-articular shoulder (Glenohumeral) joint injection     Side: LEFT     Level: Glenohumeral joint               Sedation: without     Timeframe: As permitted by the schedule    Order Specific Question:   Where will this procedure be performed?    Answer:   [facility]    Follow-up plan:   Return in about 1 week (around 02/01/2021) for Left anterior shoulder injection , without sedation.    Recent Visits Date Type Provider Dept  12/07/20 Telemedicine [name], MD [facility]  11/23/20 Procedure visit [name], MD [facility]  11/15/20 Office Visit [name], MD [facility]  Showing recent visits within past 90 days and meeting all other requirements Today's Visits Date Type Provider Dept  01/25/21 Office Visit [name], MD [facility]  Showing today's visits and meeting all other requirements Future Appointments Date Type Provider Dept  02/02/21 Appointment [name], MD [facility]  Showing future appointments within next 90 days and meeting all other requirements I discussed the assessment and treatment plan with the patient. The patient was provided an opportunity to ask questions and all were answered. The patient agreed with the plan and demonstrated an understanding of the instructions.  Patient advised to call back or seek an in-person evaluation if the symptoms or condition worsens.  Duration of encounter: 36mnutes.  Note by: BGillis Santa MD Date: 01/25/2021; Time: 3:36 PM

## 2021-01-26 ENCOUNTER — Telehealth: Payer: Self-pay

## 2021-01-26 NOTE — Telephone Encounter (Signed)
Ok to schedule procedure. Patient is not on blood thinners and will not be having sedation so no need to call for pre procedure instructions.

## 2021-01-30 ENCOUNTER — Encounter: Payer: Self-pay | Admitting: Psychiatry

## 2021-01-30 ENCOUNTER — Ambulatory Visit (INDEPENDENT_AMBULATORY_CARE_PROVIDER_SITE_OTHER): Payer: PPO | Admitting: Psychiatry

## 2021-01-30 ENCOUNTER — Ambulatory Visit: Payer: PPO | Admitting: Psychiatry

## 2021-01-30 ENCOUNTER — Other Ambulatory Visit: Payer: Self-pay

## 2021-01-30 VITALS — BP 96/60 | HR 87 | Temp 97.9°F | Wt 165.6 lb

## 2021-01-30 DIAGNOSIS — H2513 Age-related nuclear cataract, bilateral: Secondary | ICD-10-CM | POA: Diagnosis not present

## 2021-01-30 DIAGNOSIS — G4700 Insomnia, unspecified: Secondary | ICD-10-CM | POA: Diagnosis not present

## 2021-01-30 DIAGNOSIS — F3342 Major depressive disorder, recurrent, in full remission: Secondary | ICD-10-CM | POA: Diagnosis not present

## 2021-01-30 MED ORDER — TRAZODONE HCL 100 MG PO TABS: ORAL_TABLET | ORAL | 0 refills | Status: DC

## 2021-01-30 MED ORDER — BUPROPION HCL ER (XL) 300 MG PO TB24: 300.0000 mg | ORAL_TABLET | Freq: Every day | ORAL | 0 refills | Status: DC

## 2021-01-30 NOTE — Progress Notes (Signed)
[place] MD/PA/NP OP Progress Note  01/30/2021 5:25 PM RHONAN SCOBEY  MRN:  [id number]  Chief Complaint:  Chief Complaint   Follow-up; Depression    HPI:  This is a follow-up appointment for depression and insomnia.  He states that he is doing very well.  He had a birthday, and had good time with his family.  His daily life is "sedentary." He mostly stays at home, sitting in recliner, watching TV.  He also enjoys reading; he just finished reading return.  He states that he majored in Therapist, occupational in college, and he has strong interest in this field.  He is thinking of attending meetings for politics, although he is not planning to do anything on his own except voting.  He goes to [employer] meeting a few times per week, and helps other people.  He denies feeling depressed or anhedonia.  He has issues with initial insomnia despite taking trazodone.  He wonders if he can take higher dose.  He states that he has been prescribed quetiapine for insomnia as well.  He denies change in appetite.  He has fair energy.  He denies SI.  He feels comfortable to stay on his medication except trying higher dose of trazodone.   Daily routine: wakes up at 7 am, watch TV, goes out to [employer] meeting in person (35 years of membership), sleeps 8-9 pm,  Exercise: (stays in the house) Employment: retired at 80 yo. He used to work as a Manufacturing systems engineer Household: wife Marital status: 73 years with his second wife, divorced before Number of children: 1 child, 2 grandchildren, 3 step children Education: MS in Therapist, occupational  Visit Diagnosis:    ICD-10-CM   1. MDD (major depressive disorder), recurrent, in full remission ([place])  F33.42     2. Insomnia, unspecified type  G47.00       Past Psychiatric History: Please see initial evaluation for full details. I have reviewed the history. No updates at this time.     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/2017   Colon polyps    Dementia ([place])    Depression    Diabetes mellitus type 2 in nonobese (HCC)    GERD (gastroesophageal reflux disease)    BARRETTS ESOPHAGUS RESOLVED PER PATIENT   Headache    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[place])    History of hiatal hernia    Hypercholesteremia    Hypertension    Hyperthyroidism    Neuropathic pain    Primary localized osteoarthritis of right knee    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   Stomach ulcer    Tremor, essential    Wound infection complicating hardware ([place]) 04/02/2018    Past Surgical History:  Procedure Laterality Date   ANKLE ARTHROSCOPY Left 09/29/2019   Procedure: LEFT ANKLE ARTHROSCOPY, DEBRIDEMENT;  Surgeon: Newt Minion, MD;  Location: [facility];  Service: Orthopedics;  Laterality: Left;   BACK SURGERY     COLONOSCOPY WITH PROPOFOL N/A 09/14/2016   Procedure: COLONOSCOPY WITH PROPOFOL;  Surgeon: Manya Silvas, MD;  Location: [facility] ENDOSCOPY;  Service: Endoscopy;  Laterality: N/A;   esophageal stretch     ESOPHAGOGASTRODUODENOSCOPY (EGD) WITH PROPOFOL N/A 12/13/2020   Procedure: ESOPHAGOGASTRODUODENOSCOPY (EGD) WITH PROPOFOL;  Surgeon: Lesly Rubenstein,  MD;  Location: [facility] ENDOSCOPY;  Service: Endoscopy;  Laterality: N/A;  IDDM   JOINT REPLACEMENT Right 2016   knee   LUMBAR LAMINECTOMY/DECOMPRESSION MICRODISCECTOMY Left 02/03/2016   Procedure: LEFT L5-S1 DISKECTOMY;  Surgeon: Leeroy Cha, MD;  Location: [facility] NEURO ORS;  Service: Neurosurgery;  Laterality: Left;  LEFT L5-S1 DISKECTOMY   PULSE GENERATOR IMPLANT N/A 08/21/2017   Procedure: UNILATERAL PULSE GENERATOR IMPLANT;  Surgeon: Meade Maw, MD;  Location: [facility] ORS;  Service: Neurosurgery;  Laterality: N/A;   PULSE GENERATOR IMPLANT Right 04/02/2018   Procedure: REMOVAL OF PULSE GENERATOR IMPLANT AND LEADS;  Surgeon:  Meade Maw, MD;  Location: [facility] ORS;  Service: Neurosurgery;  Laterality: Right;   TONSILLECTOMY     TOTAL KNEE ARTHROPLASTY Right 11/15/2014   Procedure: TOTAL KNEE ARTHROPLASTY;  Surgeon: Elsie Saas, MD;  Location: [facility];  Service: Orthopedics;  Laterality: Right;    Family Psychiatric History: Please see initial evaluation for full details. I have reviewed the history. No updates at this time.     Family History:  Family History  Problem Relation Age of Onset   Heart attack Mother    Heart disease Mother    Hypertension Father    Depression Father    Kidney cancer Neg Hx    Prostate cancer Neg Hx     Social History:  Social History   Socioeconomic History   Marital status: Married    Spouse name: Not on file   Number of children: Not on file   Years of education: Not on file   Highest education level: Not on file  Occupational History   Not on file  Tobacco Use   Smoking status: Every Day    Packs/day: 0.50    Years: 60.00    Pack years: 30.00    Types: Cigarettes   Smokeless tobacco: Never   Tobacco comments:    Last one at 0400  Vaping Use   Vaping Use: Former  Substance and Sexual Activity   Alcohol use: Not Currently    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   Drug use: Never   Sexual activity: Not Currently  Other Topics Concern   Not on file  Social History Narrative   Not on file   Social Determinants of Health   Financial Resource Strain: High Risk   Difficulty of Paying Living Expenses: Hard  Food Insecurity: Not on file  Transportation Needs: Not on file  Physical Activity: Not on file  Stress: Not on file  Social Connections: Not on file    Allergies: No Known Allergies  Metabolic Disorder Labs: Lab Results  Component Value Date   HGBA1C 6.8 (A) 01/11/2021   MPG 131 01/25/2016   MPG 166 11/05/2014   No results found for: PROLACTIN Lab Results  Component Value Date   CHOL 99 07/05/2020   TRIG 226.0 (H) 07/05/2020    HDL 43.20 07/05/2020   CHOLHDL 2 07/05/2020   VLDL 45.2 (H) 07/05/2020   LDLCALC 57 09/25/2017   Lab Results  Component Value Date   TSH 0.97 10/11/2020   TSH 0.346 (L) 05/31/2020    Therapeutic Level Labs: No results found for: LITHIUM No results found for: VALPROATE No components found for:  CBMZ  Current Medications: Current Outpatient Medications  Medication Sig Dispense Refill   aspirin EC 81 MG tablet Take 81 mg by mouth daily.     budesonide (ENTOCORT EC) 3 MG 24 hr capsule Take 6 mg by mouth daily.  Continuous Blood Gluc Sensor (FREESTYLE LIBRE 14 DAY SENSOR) MISC APPLY ONE SENSOR EVERY 14 DAYS TO MONITOR BLOOD GLUCOSE LEVELS. REMOVE OLD SENSOR NEFORE APPLYING NEW SENSOR 2 each 5   dapagliflozin propanediol (FARXIGA) 10 MG TABS tablet Complete supply of Jardiance and then start Farxiga 10 mg daily 90 tablet 3   diphenoxylate-atropine (LOMOTIL) 2.5-0.025 MG tablet Take 1 tablet by mouth in the morning and at bedtime.     divalproex (DEPAKOTE ER) 500 MG 24 hr tablet Take 1 tablet by mouth 2 (two) times daily.     donepezil (ARICEPT) 10 MG tablet Take 10 mg by mouth at bedtime.     fenofibrate (TRICOR) 145 MG tablet TAKE ONE TABLET BY MOUTH DAILY 90 tablet 1   gabapentin (NEURONTIN) 600 MG tablet TAKE TWO TABLETS BY MOUTH THREE TIMES A DAY 180 tablet 1   glimepiride (AMARYL) 4 MG tablet Take 4 mg by mouth daily with breakfast.     HYDROcodone-acetaminophen (NORCO) 10-325 MG tablet Take 1 tablet by mouth every 4 (four) hours as needed for severe pain. Must last 30 days. 180 tablet 0   Hyoscyamine Sulfate SL 0.125 MG SUBL Place under the tongue.     ipratropium (ATROVENT) 0.03 % nasal spray Place 2 sprays into both nostrils every 12 (twelve) hours. 30 mL 12   lisinopril-hydrochlorothiazide (ZESTORETIC) 20-25 MG tablet Take 1 tablet by mouth daily. 90 tablet 1   memantine (NAMENDA) 10 MG tablet Take 1 tablet by mouth daily.     metFORMIN (GLUCOPHAGE) 1000 MG tablet Take  1,000 mg by mouth 2 (two) times daily with a meal.     metoprolol succinate (TOPROL-XL) 25 MG 24 hr tablet Take 25 mg by mouth daily.     mirabegron ER (MYRBETRIQ) 50 MG TB24 tablet Take 1 tablet (50 mg total) by mouth daily. 30 tablet 11   Multiple Vitamins-Minerals (CENTRUM SILVER PO) Take 1 tablet by mouth daily.     naloxegol oxalate (MOVANTIK) 12.5 MG TABS tablet Take 1 tablet (12.5 mg total) by mouth daily. Do not break tablet. Take on empty stomach 1 hour before or 2 hours after a meal. 30 tablet 2   omega-3 acid ethyl esters (LOVAZA) 1 g capsule Take 2 capsules (2 g total) by mouth 2 (two) times daily. 360 capsule 3   omeprazole (PRILOSEC) 20 MG capsule Take 20 mg by mouth 2 (two) times daily before a meal.      primidone (MYSOLINE) 50 MG tablet Take 150-250 mg by mouth 2 (two) times daily. Take 250 mg by mouth in the morning & take 150 mg at night.     propranolol (INDERAL) 60 MG tablet Take 60 mg by mouth 2 (two) times daily.     QUEtiapine (SEROQUEL) 100 MG tablet Take 150 mg by mouth at bedtime.     Semaglutide,0.25 or 0.'5MG'$ /DOS, (OZEMPIC, 0.25 OR 0.5 MG/DOSE,) 2 MG/1.5ML SOPN Inject 0.5 mg into the skin once a week. 1.5 mL 3   simvastatin (ZOCOR) 40 MG tablet Take 40 mg by mouth daily.     tamsulosin (FLOMAX) 0.4 MG CAPS capsule TAKE ONE CAPSULE BY MOUTH DAILY AFTER DINNER 90 capsule 0   [START ON 02/22/2021] buPROPion (WELLBUTRIN XL) 300 MG 24 hr tablet Take 1 tablet (300 mg total) by mouth daily. 90 tablet 0   [START ON 02/06/2021] traZODone (DESYREL) 100 MG tablet 150-200 mg at night as needed for sleep 180 tablet 0   Vibegron (GEMTESA) 75 MG TABS Take 1 tablet by  mouth daily. 30 tablet 11   No current facility-administered medications for this visit.     Musculoskeletal: Strength & Muscle Tone: within normal limits Gait & Station: shuffle due to knee pain Patient leans: N/A  Psychiatric Specialty Exam: Review of Systems  Psychiatric/Behavioral:  Positive for sleep  disturbance. Negative for agitation, behavioral problems, confusion, decreased concentration, dysphoric mood, hallucinations, self-injury and suicidal ideas. The patient is not nervous/anxious and is not hyperactive.   All other systems reviewed and are negative.  Blood pressure 96/60, pulse 87, temperature 97.9 F (36.6 C), temperature source Temporal, weight 165 lb 9.6 oz (75.1 kg).Body mass index is 23.76 kg/m.  General Appearance: Fairly Groomed  Eye Contact:  Good  Speech:  Clear and Coherent  Volume:  Normal  Mood:   good  Affect:  Appropriate, Congruent, and Full Range  Thought Process:  Coherent and Goal Directed  Orientation:  Full (Time, Place, and Person)  Thought Content: Logical   Suicidal Thoughts:  No  Homicidal Thoughts:  No  Memory:  Immediate;   Good  Judgement:  Good  Insight:  Good  Psychomotor Activity:  Normal  Concentration:  Concentration: Good and Attention Span: Good  Recall:  Good  Fund of Knowledge: Good  Language: Good  Akathisia:  No  Handed:  Right  AIMS (if indicated): 0  Assets:  Communication Skills Desire for Improvement  ADL's:  Intact  Cognition: WNL  Sleep:  Poor   Screenings: GAD-7    Flowsheet Row Counselor from 12/07/2020 in [place] from 11/02/2020 in [place] from 09/07/2020 in [place] Office Visit from 09/09/2015 in [place]  Total GAD-7 Score '3 6 5 6      '$ [place] from 11/15/2017 in [place] from 11/14/2016 in [facility]  Total Score (max 30 points ) 30 30      PHQ2-9    [place] Visit from 01/30/2021 in [place] from 12/28/2020 in [place] from 12/07/2020 in [place]  Visit from 11/14/2020 in [place] from 11/02/2020 in [place]  PHQ-2 Total Score 0 0 '1 2 1  '$ PHQ-9 Total Score -- -- '5 3 5      '$ [place] Office Visit from 01/30/2021 in [place] from 01/18/2021 in [place] Admission (Discharged) from 12/13/2020 in [place] No Risk No Risk No Risk        Assessment and Plan:  ANGUEL LEAMY is a 80 y.o. year old male with a history of  pseudo dementia, sleep disorder, tremor, history of QTc prolongation,, who presents for follow up appointment for below.   1. MDD (major depressive disorder), recurrent, in full remission ([place]) He denies any significant mood symptoms except self-limited statements in the context of 911. Psychosocial stressors includes current politics, conflict with the wife of his stepson, and demoralization in relate to aging/pain (including not being able to actively engaged in political activity).  He is actively involved in [place], and enjoys the group gathering/family connection.  We will continue current dose of bupropion as maintenance treatment for depression.  Noted that although it was discussed about potentially tapering off quetiapine, he prefers to stay at the current dose, and this is  prescribed by another provider.  Discussed potential metabolic side effect and EPS.   2. Insomnia, unspecified type He continues to have occasional initial insomnia, although he has good benefit from trazodone.  Will try higher dose to optimize treatment for insomnia.  Discussed potential risk of drowsiness.   # History of cognitive impairment Unchanged. 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  Exam is notable for great insight into current politics, and his up bringing.  It is difficult to discern whether his cognitive impairment was  attributable to depression, or normal aging.  Will continue to monitor and consider make referral for neuropsych evaluation if any worsening.   This clinician has discussed the side effect associated with medication prescribed during this encounter. Please refer to notes in the previous encounters for more details.     Plan 1. Continue bupropion XR 300 mg daily  2. Increase Trazodone 200 mg at night as needed for insomnia 3. 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 Acute risk factors for suicide include: N/A. Protective factors for this patient include: positive social support, responsibility to others (children, family), coping skills and hope for the future. Considering these factors, the overall suicide risk at this point appears to be low. Patient is appropriate for outpatient follow up.     Norman Clay, MD 01/30/2021, 5:25 PM

## 2021-02-01 ENCOUNTER — Ambulatory Visit (HOSPITAL_BASED_OUTPATIENT_CLINIC_OR_DEPARTMENT_OTHER): Payer: PPO | Admitting: Student in an Organized Health Care Education/Training Program

## 2021-02-01 ENCOUNTER — Other Ambulatory Visit: Payer: Self-pay

## 2021-02-01 ENCOUNTER — Encounter: Payer: Self-pay | Admitting: Student in an Organized Health Care Education/Training Program

## 2021-02-01 ENCOUNTER — Ambulatory Visit
Admission: RE | Admit: 2021-02-01 | Discharge: 2021-02-01 | Disposition: A | Discharge status: Home / Self Care | Payer: PPO | Source: Ambulatory Visit | Attending: Student in an Organized Health Care Education/Training Program | Admitting: Student in an Organized Health Care Education/Training Program

## 2021-02-01 VITALS — BP 123/80 | HR 82 | Temp 97.1°F | Resp 16 | Ht 71.0 in | Wt 165.0 lb

## 2021-02-01 DIAGNOSIS — G894 Chronic pain syndrome: Secondary | ICD-10-CM | POA: Diagnosis not present

## 2021-02-01 DIAGNOSIS — M19012 Primary osteoarthritis, left shoulder: Secondary | ICD-10-CM | POA: Diagnosis not present

## 2021-02-01 DIAGNOSIS — M25512 Pain in left shoulder: Secondary | ICD-10-CM

## 2021-02-01 DIAGNOSIS — G8929 Other chronic pain: Secondary | ICD-10-CM | POA: Diagnosis not present

## 2021-02-01 MED ORDER — LIDOCAINE HCL (PF) 2 % IJ SOLN
INTRAMUSCULAR | Status: AC
  Filled 2021-02-01: qty 10

## 2021-02-01 MED ORDER — IOHEXOL 180 MG/ML  SOLN
10.0000 mL | Freq: Once | INTRAMUSCULAR | Status: AC
  Administered 2021-02-01: 5 mL via INTRA_ARTICULAR

## 2021-02-01 MED ORDER — DEXAMETHASONE SODIUM PHOSPHATE 10 MG/ML IJ SOLN
INTRAMUSCULAR | Status: AC
  Filled 2021-02-01: qty 1

## 2021-02-01 MED ORDER — ROPIVACAINE HCL 2 MG/ML IJ SOLN
4.0000 mL | Freq: Once | INTRAMUSCULAR | Status: AC
  Administered 2021-02-01: 4 mL via INTRA_ARTICULAR

## 2021-02-01 MED ORDER — METHYLPREDNISOLONE ACETATE 40 MG/ML IJ SUSP
40.0000 mg | Freq: Once | INTRAMUSCULAR | Status: AC
  Administered 2021-02-01: 40 mg via INTRA_ARTICULAR
  Filled 2021-02-01: qty 1

## 2021-02-01 NOTE — Progress Notes (Signed)
Nursing Pain Medication Assessment:  Safety precautions to be maintained throughout the outpatient stay will include: orient to surroundings, keep bed in low position, maintain call bell within reach at all times, provide assistance with transfer out of bed and ambulation.  Medication Inspection Compliance: Pill count conducted under aseptic conditions, in front of the patient. Neither the pills nor the bottle was removed from the patient's sight at any time. Once count was completed pills were immediately returned to the patient in their original bottle.  Medication: Hydrocodone/APAP Pill/Patch Count:  146 of 180 pills remain Pill/Patch Appearance: Markings consistent with prescribed medication Bottle Appearance: Standard pharmacy container. Clearly labeled. Filled Date: 8 / 29 / 2022 Last Medication intake:  Today Safety precautions to be maintained throughout the outpatient stay will include: orient to surroundings, keep bed in low position, maintain call bell within reach at all times, provide assistance with transfer out of bed and ambulation.

## 2021-02-01 NOTE — Progress Notes (Signed)
PROVIDER NOTE: Information contained herein reflects review and annotations entered in association with encounter. Interpretation of such information and data should be left to medically-trained personnel. Information provided to patient can be located elsewhere in the medical record under "Patient Instructions". Document created using STT-dictation technology, any transcriptional errors that may result from process are unintentional.    Patient: James Hood  Service Category: Procedure  Provider: [name], MD  DOB: [birth year]  DOS: 02/01/2021  Location: [facility]  MRN: [id number]  Setting: Ambulatory - outpatient  Referring Provider: Leone Haven, MD  Type: Established Patient  Specialty: Interventional Pain Management  PCP: Leone Haven, MD   Primary Reason for Visit: Interventional Pain Management Treatment. CC: Shoulder Pain (Left/)    Procedure:          Anesthesia, Analgesia, Anxiolysis:  Type: Diagnostic Glenohumeral Joint (shoulder) Injection          Primary Purpose: Diagnostic Region: Superior Shoulder Area Level:  Shoulder Target Area: Glenohumeral Joint (shoulder) Approach: Anterior approach. Laterality: Left-Sided  Type: Local Anesthesia Local Anesthetic: Lidocaine 1-2%    Position: Supine   Indications: 1. Primary osteoarthritis of left shoulder   2. Chronic left shoulder pain   3. Chronic pain syndrome    Pain Score: Pre-procedure: 4 /10 Post-procedure: 0-No pain/10     Pre-op H&P Assessment:  Mr. Mcfarren is a 80 y.o. (year old), male patient, seen today for interventional treatment. He  has a past surgical history that includes esophageal stretch; Tonsillectomy; Total knee arthroplasty (Right, 11/15/2014); Lumbar laminectomy/decompression microdiscectomy (Left, 02/03/2016); Colonoscopy with propofol (N/A, 09/14/2016); Joint replacement (Right, 2016); Pulse generator implant (N/A, 08/21/2017); Back surgery; Pulse generator implant  (Right, 04/02/2018); Ankle arthroscopy (Left, 09/29/2019); and Esophagogastroduodenoscopy (egd) with propofol (N/A, 12/13/2020). Mr. Lennartz has a current medication list which includes the following prescription(s): aspirin ec, budesonide, [START ON 02/22/2021] bupropion, freestyle libre 14 day sensor, dapagliflozin propanediol, diphenoxylate-atropine, divalproex, donepezil, fenofibrate, gabapentin, glimepiride, hydrocodone-acetaminophen, hyoscyamine sulfate sl, ipratropium, lisinopril-hydrochlorothiazide, memantine, metformin, metoprolol succinate, mirabegron er, multiple vitamins-minerals, naloxegol oxalate, omega-3 acid ethyl esters, omeprazole, primidone, propranolol, quetiapine, ozempic (0.25 or 0.5 mg/dose), simvastatin, tamsulosin, [START ON 02/06/2021] trazodone, and gemtesa. His primarily concern today is the Shoulder Pain (Left/)  Initial Vital Signs:  Pulse/HCG Rate: 90  Temp: (!) 97.1 F (36.2 C) Resp: 18 BP: 118/71 SpO2: 95 %  BMI: Estimated body mass index is 23.01 kg/m as calculated from the following:   Height as of this encounter: '5\' 11"'$  (1.803 m).   Weight as of this encounter: 165 lb (74.8 kg).  Risk Assessment: Allergies: Reviewed. He has No Known Allergies.  Allergy Precautions: None required Coagulopathies: Reviewed. None identified.  Blood-thinner therapy: None at this time Active Infection(s): Reviewed. None identified. Mr. Ravens is afebrile  Site Confirmation: Mr. Bemiller was asked to confirm the procedure and laterality before marking the site Procedure checklist: Completed Consent: Before the procedure and under the influence of no sedative(s), amnesic(s), or anxiolytics, the patient was informed of the treatment options, risks and possible complications. To fulfill our ethical and legal obligations, as recommended by the [employer]'s Code of Ethics, I have informed the patient of my clinical impression; the nature and purpose of the treatment or  procedure; the risks, benefits, and possible complications of the intervention; the alternatives, including doing nothing; the risk(s) and benefit(s) of the alternative treatment(s) or procedure(s); and the risk(s) and benefit(s) of doing nothing. The patient was provided information about the general risks and possible  complications associated with the procedure. These may include, but are not limited to: failure to achieve desired goals, infection, bleeding, organ or nerve damage, allergic reactions, paralysis, and death. In addition, the patient was informed of those risks and complications associated to the procedure, such as failure to decrease pain; infection; bleeding; organ or nerve damage with subsequent damage to sensory, motor, and/or autonomic systems, resulting in permanent pain, numbness, and/or weakness of one or several areas of the body; allergic reactions; (i.e.: anaphylactic reaction); and/or death. Furthermore, the patient was informed of those risks and complications associated with the medications. These include, but are not limited to: allergic reactions (i.e.: anaphylactic or anaphylactoid reaction(s)); adrenal axis suppression; blood sugar elevation that in diabetics may result in ketoacidosis or comma; water retention that in patients with history of congestive heart failure may result in shortness of breath, pulmonary edema, and decompensation with resultant heart failure; weight gain; swelling or edema; medication-induced neural toxicity; particulate matter embolism and blood vessel occlusion with resultant organ, and/or nervous system infarction; and/or aseptic necrosis of one or more joints. Finally, the patient was informed that Medicine is not an exact science; therefore, there is also the possibility of unforeseen or unpredictable risks and/or possible complications that may result in a catastrophic outcome. The patient indicated having understood very clearly. We have given the  patient no guarantees and we have made no promises. Enough time was given to the patient to ask questions, all of which were answered to the patient's satisfaction. Mr. Gorbett has indicated that he wanted to continue with the procedure. Attestation: I, the ordering provider, attest that I have discussed with the patient the benefits, risks, side-effects, alternatives, likelihood of achieving goals, and potential problems during recovery for the procedure that I have provided informed consent. Date  Time: 02/01/2021  2:25 PM  Pre-Procedure Preparation:  Monitoring: As per clinic protocol. Respiration, ETCO2, SpO2, BP, heart rate and rhythm monitor placed and checked for adequate function Safety Precautions: Patient was assessed for positional comfort and pressure points before starting the procedure. Time-out: I initiated and conducted the "Time-out" before starting the procedure, as per protocol. The patient was asked to participate by confirming the accuracy of the "Time Out" information. Verification of the correct person, site, and procedure were performed and confirmed by me, the nursing staff, and the patient. "Time-out" conducted as per Joint Commission's Universal Protocol (UP.01.01.01). Time: 1450  Description of Procedure:          Area Prepped: Entire shoulder Area DuraPrep (Iodine Povacrylex [0.7% available iodine] and Isopropyl Alcohol, 74% w/w) Safety Precautions: Aspiration looking for blood return was conducted prior to all injections. At no point did we inject any substances, as a needle was being advanced. No attempts were made at seeking any paresthesias. Safe injection practices and needle disposal techniques used. Medications properly checked for expiration dates. SDV (single dose vial) medications used. Description of the Procedure: Protocol guidelines were followed. The patient was placed in position over the procedure table. The target area was identified and the area prepped  in the usual manner. Skin & deeper tissues infiltrated with local anesthetic. Appropriate amount of time allowed to pass for local anesthetics to take effect. The procedure needles were then advanced to the target area. Proper needle placement secured. Negative aspiration confirmed. Solution injected in intermittent fashion, asking for systemic symptoms every 0.5cc of injectate. The needles were then removed and the area cleansed, making sure to leave some of the prepping solution back to take  advantage of its long term bactericidal properties.         Vitals:   02/01/21 1437 02/01/21 1445 02/01/21 1455  BP: 118/71 121/82 123/80  Pulse: 90 85 82  Resp:  18 16  Temp: (!) 97.1 F (36.2 C)    SpO2: 95% 95% 96%  Weight: 165 lb (74.8 kg)    Height: '5\' 11"'$  (1.803 m)      Start Time: 1450 hrs. End Time: 1453 hrs. Materials:  Needle(s) Type: Spinal Needle Gauge: 22G Length: 3.5-in Medication(s): Please see orders for medications and dosing details. 5cc solution made of 4cc of 0.2% ropivacaine, 1 cc of Depomedrol (40 mg/cc) injected into left glenohumeral joint  Imaging Guidance (Non-Spinal):          Type of Imaging Technique: Fluoroscopy Guidance (Non-Spinal) Indication(s): Assistance in needle guidance and placement for procedures requiring needle placement in or near specific anatomical locations not easily accessible without such assistance. Exposure Time: Please see nurses notes. Contrast: Before injecting any contrast, we confirmed that the patient did not have an allergy to iodine, shellfish, or radiological contrast. Once satisfactory needle placement was completed at the desired level, radiological contrast was injected. Contrast injected under live fluoroscopy. No contrast complications. See chart for type and volume of contrast used. Fluoroscopic Guidance: I was personally present during the use of fluoroscopy. "Tunnel Vision Technique" used to obtain the best possible view of the  target area. Parallax error corrected before commencing the procedure. "Direction-depth-direction" technique used to introduce the needle under continuous pulsed fluoroscopy. Once target was reached, antero-posterior, oblique, and lateral fluoroscopic projection used confirm needle placement in all planes. Images permanently stored in EMR. Interpretation: I personally interpreted the imaging intraoperatively. Adequate needle placement confirmed in multiple planes. Appropriate spread of contrast into desired area was observed. No evidence of afferent or efferent intravascular uptake. Permanent images saved into the patient's record.  Post-operative Assessment:  Post-procedure Vital Signs:  Pulse/HCG Rate: 82 (Nsr)  Temp:  (!) 97.1 F (36.2 C) Resp: 16 BP: 123/80 SpO2: 96 %  EBL: None  Complications: No immediate post-treatment complications observed by team, or reported by patient.  Note: The patient tolerated the entire procedure well. A repeat set of vitals were taken after the procedure and the patient was kept under observation following institutional policy, for this type of procedure. Post-procedural neurological assessment was performed, showing return to baseline, prior to discharge. The patient was provided with post-procedure discharge instructions, including a section on how to identify potential problems. Should any problems arise concerning this procedure, the patient was given instructions to immediately contact us, at any time, without hesitation. In any case, we plan to contact the patient by telephone for a follow-up status report regarding this interventional procedure.  Comments:  No additional relevant information.  Plan of Care  Orders:  Orders Placed This Encounter  Procedures   DG PAIN CLINIC C-ARM 1-60 MIN NO REPORT    Intraoperative interpretation by procedural physician at [facility].    Standing Status:   Standing    Number of Occurrences:   1     Order Specific Question:   Reason for exam:    Answer:   Assistance in needle guidance and placement for procedures requiring needle placement in or near specific anatomical locations not easily accessible without such assistance.   Chronic Opioid Analgesic:  Hydrocodone 10 mg every 4 hours as needed, quantity 180/month; MME equals 60    Medications ordered for procedure: Meds ordered this  encounter  Medications   iohexol (OMNIPAQUE) 180 MG/ML injection 10 mL    Must be Myelogram-compatible. If not available, you may substitute with a water-soluble, non-ionic, hypoallergenic, myelogram-compatible radiological contrast medium.   ropivacaine (PF) 2 mg/mL (0.2%) (NAROPIN) injection 4 mL   methylPREDNISolone acetate (DEPO-MEDROL) injection 40 mg   Medications administered: We administered iohexol, ropivacaine (PF) 2 mg/mL (0.2%), and methylPREDNISolone acetate.  See the medical record for exact dosing, route, and time of administration.  Follow-up plan:   Return in about 8 weeks (around 03/29/2021) for Medication Management, Post Procedure Evaluation, in person.     Recent Visits Date Type Provider Dept  01/25/21 Office Visit [name], MD [facility]  12/07/20 Telemedicine [name], MD [facility]  11/23/20 Procedure visit [name], MD [facility]  11/15/20 Office Visit [name], MD [facility]  Showing recent visits within past 90 days and meeting all other requirements Today's Visits Date Type Provider Dept  02/01/21 Procedure visit [name], MD [facility]  Showing today's visits and meeting all other requirements Future Appointments Date Type Provider Dept  03/29/21 Appointment [name], MD [facility]  Showing future appointments within next 90 days and meeting all other requirements Disposition: Discharge home  Discharge (Date  Time): 02/01/2021; 1457 hrs.   Primary Care Physician:  Leone Haven, MD Location: [facility] Note by: [name], MD Date: 02/01/2021; Time: 3:17 PM  Disclaimer:  Medicine is not an Chief Strategy Officer. The only guarantee in medicine is that nothing is guaranteed. It is important to note that the decision to proceed with this intervention was based on the information collected from the patient. The Data and conclusions were drawn from the patient's questionnaire, the interview, and the physical examination. Because the information was provided in large part by the patient, it cannot be guaranteed that it has not been purposely or unconsciously manipulated. Every effort has been made to obtain as much relevant data as possible for this evaluation. It is important to note that the conclusions that lead to this procedure are derived in large part from the available data. Always take into account that the treatment will also be dependent on availability of resources and existing treatment guidelines, considered by other Pain Management Practitioners as being common knowledge and practice, at the time of the intervention. For Medico-Legal purposes, it is also important to point out that variation in procedural techniques and pharmacological choices are the acceptable norm. The indications, contraindications, technique, and results of the above procedure should only be interpreted and judged by a Board-Certified Interventional Pain Specialist with extensive familiarity and expertise in the same exact procedure and technique.

## 2021-02-01 NOTE — Patient Instructions (Signed)

## 2021-02-02 ENCOUNTER — Encounter: Payer: PPO | Admitting: Student in an Organized Health Care Education/Training Program

## 2021-02-02 ENCOUNTER — Telehealth: Payer: Self-pay | Admitting: *Deleted

## 2021-02-02 NOTE — Telephone Encounter (Signed)
No problems post procedure. 

## 2021-02-06 DIAGNOSIS — G250 Essential tremor: Secondary | ICD-10-CM | POA: Diagnosis not present

## 2021-02-06 DIAGNOSIS — F17200 Nicotine dependence, unspecified, uncomplicated: Secondary | ICD-10-CM | POA: Diagnosis not present

## 2021-02-06 DIAGNOSIS — F3131 Bipolar disorder, current episode depressed, mild: Secondary | ICD-10-CM | POA: Diagnosis not present

## 2021-02-06 DIAGNOSIS — J449 Chronic obstructive pulmonary disease, unspecified: Secondary | ICD-10-CM | POA: Diagnosis not present

## 2021-02-06 DIAGNOSIS — I959 Hypotension, unspecified: Secondary | ICD-10-CM | POA: Diagnosis not present

## 2021-02-06 DIAGNOSIS — D692 Other nonthrombocytopenic purpura: Secondary | ICD-10-CM | POA: Diagnosis not present

## 2021-02-06 DIAGNOSIS — Z7984 Long term (current) use of oral hypoglycemic drugs: Secondary | ICD-10-CM | POA: Diagnosis not present

## 2021-02-06 DIAGNOSIS — E1122 Type 2 diabetes mellitus with diabetic chronic kidney disease: Secondary | ICD-10-CM | POA: Diagnosis not present

## 2021-02-06 DIAGNOSIS — E1142 Type 2 diabetes mellitus with diabetic polyneuropathy: Secondary | ICD-10-CM | POA: Diagnosis not present

## 2021-02-06 DIAGNOSIS — Z79899 Other long term (current) drug therapy: Secondary | ICD-10-CM | POA: Diagnosis not present

## 2021-02-06 DIAGNOSIS — F0390 Unspecified dementia without behavioral disturbance: Secondary | ICD-10-CM | POA: Diagnosis not present

## 2021-02-06 DIAGNOSIS — I129 Hypertensive chronic kidney disease with stage 1 through stage 4 chronic kidney disease, or unspecified chronic kidney disease: Secondary | ICD-10-CM | POA: Diagnosis not present

## 2021-02-08 LAB — HM DIABETES EYE EXAM

## 2021-02-08 NOTE — Congregational Nurse Program (Signed)
  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 Yes    Visit Setting Church or Counselling psychologist Patient Served At [employer]    Patient Status Not Applicable    Medical Provider Yes    Insurance Medicare    Intervention Assess (including screenings);Educate    Food Have food insecurities            [facility] only [facility] requesting BP check. Agrees to care. States he is taking all prescribed meds. Regularly scans blood sugar therefore declines screening today. BP 124/78. No complaints. Routine follow up with MD. Vicenta Dunning

## 2021-02-10 ENCOUNTER — Telehealth: Payer: PPO

## 2021-02-13 ENCOUNTER — Other Ambulatory Visit: Payer: Self-pay | Admitting: Family Medicine

## 2021-02-13 ENCOUNTER — Telehealth: Payer: Self-pay | Admitting: Family Medicine

## 2021-02-13 DIAGNOSIS — L570 Actinic keratosis: Secondary | ICD-10-CM

## 2021-02-13 NOTE — Telephone Encounter (Signed)
Patient called and said that [facility]  can not see him until 05/24/2021, He also states that he does not care for the doctor. Patient is requesting another office, even if it's in [place]. He wants to get in sooner.

## 2021-02-14 NOTE — Telephone Encounter (Signed)
I called the patient and informed him of the new dermatology office and he understood.  Libi Corso,cma

## 2021-02-14 NOTE — Telephone Encounter (Signed)
Patient called and said that [facility]  can not see him until 05/24/2021, He also states that he does not care for the doctor. Patient is requesting another office, even if it's in [place]. He wants to get in sooner.  Lexi Conaty,cma

## 2021-02-14 NOTE — Telephone Encounter (Signed)
I put in a new referral for him to [facility] dermatology.  This is a different office.  I cannot promise that he is can to get in sooner than that.  All of the specialists and particularly the dermatologists are booked out quite a ways.

## 2021-02-14 NOTE — Addendum Note (Signed)
Addended by: Caryl Bis Tolbert Matheson G on: 02/14/2021 01:55 PM   Modules accepted: Orders

## 2021-02-15 ENCOUNTER — Other Ambulatory Visit: Payer: Self-pay

## 2021-02-15 ENCOUNTER — Ambulatory Visit (INDEPENDENT_AMBULATORY_CARE_PROVIDER_SITE_OTHER): Payer: PPO | Admitting: Licensed Clinical Social Worker

## 2021-02-15 DIAGNOSIS — F3342 Major depressive disorder, recurrent, in full remission: Secondary | ICD-10-CM

## 2021-02-15 NOTE — Plan of Care (Signed)
  Problem: Decrease depressive symptoms and improve levels of effective functioning Goal: LTG: Reduce frequency, intensity, and duration of depression symptoms as evidenced by: pt self report Outcome: Progressing Goal: STG: @PREFFIRSTNAME @ will participate in at least 80% of scheduled individual psychotherapy sessions Outcome: Progressing Intervention: Assist with coping skills and behavior Intervention: Encourage patient to identify triggers Intervention: Support useful positive self-talk

## 2021-02-15 NOTE — Telephone Encounter (Signed)
It looks like he has simvastatin on his medication list as a patient reported medication.  Can you contact him and see if he is taking simvastatin or if he is taking Crestor?  Thanks.

## 2021-02-15 NOTE — Progress Notes (Signed)
   THERAPIST PROGRESS NOTE  Session Time: 8-9a  Participation Level: Active  Behavioral Response: Neat and Well GroomedAlertAnxious  Type of Therapy: Individual Therapy  Treatment Goals addressed:  Problem: Decrease depressive symptoms and improve levels of effective functioning Goal: LTG: Reduce frequency, intensity, and duration of depression symptoms as evidenced by: pt self report Outcome: Progressing Goal: STG: @PREFFIRSTNAME @ will participate in at least 80% of scheduled individual psychotherapy sessions  Interventions:  Intervention: Assist with coping skills and behavior Intervention: Encourage patient to identify triggers Intervention: Support useful positive self-talk  Summary: James Hood is a 80 y.o. male who presents with improving depression symptoms. Pt reports that mood has been stable.  Explored thoughts and feelings related to: maintaining levels of sobriety, stress associated with politics, family relationships.  Reviewed importance of setting limits and boundaries to reduce stress.   Continued recommendations are as follows: self care behaviors, positive social engagements, focusing on overall work/home/life balance, and focusing on positive physical and emotional wellness. .   Suicidal/Homicidal: No  Therapist Response: Pt is continuing to apply interventions learned in session into daily life situations. Pt is currently on track to meet goals utilizing interventions mentioned above. Personal growth and progress noted. Treatment to continue as indicated.    Plan: Return again in 4 weeks.  Diagnosis: Axis I: MDD, recurrent, partial remission    Axis II: No diagnosis    [employer], LCSW 02/15/2021

## 2021-02-16 ENCOUNTER — Ambulatory Visit (INDEPENDENT_AMBULATORY_CARE_PROVIDER_SITE_OTHER): Payer: PPO | Admitting: Pharmacist

## 2021-02-16 DIAGNOSIS — E782 Mixed hyperlipidemia: Secondary | ICD-10-CM

## 2021-02-16 DIAGNOSIS — N3281 Overactive bladder: Secondary | ICD-10-CM

## 2021-02-16 DIAGNOSIS — F32A Depression, unspecified: Secondary | ICD-10-CM

## 2021-02-16 DIAGNOSIS — E1142 Type 2 diabetes mellitus with diabetic polyneuropathy: Secondary | ICD-10-CM

## 2021-02-16 DIAGNOSIS — I10 Essential (primary) hypertension: Secondary | ICD-10-CM

## 2021-02-16 DIAGNOSIS — N1831 Chronic kidney disease, stage 3a: Secondary | ICD-10-CM

## 2021-02-16 DIAGNOSIS — Z720 Tobacco use: Secondary | ICD-10-CM

## 2021-02-16 DIAGNOSIS — F419 Anxiety disorder, unspecified: Secondary | ICD-10-CM

## 2021-02-16 NOTE — Patient Instructions (Signed)
Visit Information  PATIENT GOALS:  Goals Addressed               This Visit's Progress     Patient Stated     Medication Monitoring (pt-stated)        Patient Goals/Self-Care Activities Over the next 90 days, patient will:  - take medications as prescribed check glucose at least 3 times daily using CGM, document, and provide at future appointments collaborate with provider on medication access solutions engage in dietary modifications by reducing carbohydrate portion sizes Focus on tobacco cessation         Patient verbalizes understanding of instructions provided today and agrees to view in [place].   Plan: Telephone follow up appointment with care management team member scheduled for:  pending medication access  Catie Darnelle Maffucci, PharmD, [place], [place] Clinical Pharmacist [employer] at [facility] [phone]

## 2021-02-16 NOTE — Telephone Encounter (Signed)
Called to speak with James Hood. Laduke states that he is taking the Simvastatin and not the Rosuvastatin medication. Pt confirmed again that he does not ned Rosuvastatin sent in.

## 2021-02-16 NOTE — Chronic Care Management (AMB) (Signed)
Chronic Care Management Pharmacy Note  02/16/2021 Name:  James Hood MRN:  [id number] DOB:  1940/07/17    Subjective: James Hood is an 80 y.o. year old male who is a primary patient of Caryl Bis, Angela Adam, MD.  The CCM team was consulted for assistance with disease management and care coordination needs.    Engaged with patient by telephone for follow up visit in response to provider referral for pharmacy case management and/or care coordination services.   Consent to Services:  The patient was given information about Chronic Care Management services, agreed to services, and gave verbal consent prior to initiation of services.  Please see initial visit note for detailed documentation.   Patient Care Team: Leone Haven, MD as PCP - General (Family Medicine) De Hollingshead, RPH-CPP as Pharmacist (Pharmacist)  Objective:  Lab Results  Component Value Date   CREATININE 1.11 10/11/2020   CREATININE 1.47 09/08/2020   CREATININE 1.21 07/20/2020    Lab Results  Component Value Date   HGBA1C 6.8 (A) 01/11/2021   Last diabetic Eye exam:  Lab Results  Component Value Date/Time   HMDIABEYEEXA No Retinopathy 01/27/2020 03:37 PM    Last diabetic Foot exam: No results found for: HMDIABFOOTEX      Component Value Date/Time   CHOL 99 07/05/2020 1209   TRIG 226.0 (H) 07/05/2020 1209   HDL 43.20 07/05/2020 1209   CHOLHDL 2 07/05/2020 1209   VLDL 45.2 (H) 07/05/2020 1209   LDLCALC 57 09/25/2017 0924   LDLDIRECT 31.0 07/05/2020 1209    Hepatic Function Latest Ref Rng & Units 10/11/2020 06/08/2020 05/31/2020  Total Protein 6.0 - 8.3 g/dL 6.2 6.9 5.8(L)  Albumin 3.5 - 5.2 g/dL 4.1 3.3(L) 2.8(L)  AST 0 - 37 U/L _0 ALT 0 - 53 U/L _1 Alk Phosphatase 39 - 117 U/L 41 58 32(L)  Total Bilirubin 0.2 - 1.2 mg/dL 0.3 0.8 0.7  Bilirubin, Direct 0.0 - 0.3 mg/dL - - -    Lab Results  Component Value Date/Time   TSH 0.97 10/11/2020 10:08 AM   TSH 0.346  (L) 05/31/2020 12:34 AM   TSH 1.36 07/20/2019 09:02 AM    CBC Latest Ref Rng & Units 10/11/2020 06/08/2020 06/01/2020  WBC 4.0 - 10.5 K/uL 5.8 6.5 6.7  Hemoglobin 13.0 - 17.0 g/dL 12.6(L) 11.8(L) 10.2(L)  Hematocrit 39.0 - 52.0 % 37.5(L) 35.7(L) 31.0(L)  Platelets 150.0 - 400.0 K/uL 249.0 375 203    Lab Results  Component Value Date/Time   VD25OH 24.57 (L) 10/11/2020 10:08 AM   VD25OH 42.83 04/07/2019 08:54 AM    Social History   Tobacco Use  Smoking Status Every Day   Packs/day: 0.50   Years: 60.00   Pack years: 30.00   Types: Cigarettes  Smokeless Tobacco Never  Tobacco Comments   Last one at 0400   BP Readings from Last 3 Encounters:  02/08/21 124/78  02/01/21 123/80  01/30/21 96/60   Pulse Readings from Last 3 Encounters:  02/01/21 82  01/30/21 87  01/11/21 64   Wt Readings from Last 3 Encounters:  02/01/21 165 lb (74.8 kg)  01/30/21 165 lb 9.6 oz (75.1 kg)  01/11/21 165 lb 3.2 oz (74.9 kg)    Assessment: Review of patient past medical history, allergies, medications, health status, including review of consultants reports, laboratory and other test data, was performed as part of comprehensive evaluation and provision of chronic care management services.   SDOH:  (  Social Determinants of Health) assessments and interventions performed:  SDOH Interventions    Flowsheet Row Most Recent Value  SDOH Interventions   Financial Strain Interventions Other (Comment)  [manufacturer assistance]       CCM Care Plan  No Known Allergies  Medications Reviewed Today     Reviewed by De Hollingshead, RPH-CPP (Pharmacist) on 02/16/21 at 1344  Med List Status: <None>   Medication Order Taking? Sig Documenting Provider Last Dose Status Informant  aspirin EC 81 MG tablet [id number] Yes Take 81 mg by mouth daily. [provider] Taking Active            Med Note Vanessa Ada, SUSAN   Tue Apr 15, 2018  8:47 AM)    budesonide (ENTOCORT EC) 3 MG 24 hr capsule  [id number] Yes Take 6 mg by mouth daily. [provider] Taking Active   buPROPion (WELLBUTRIN XL) 300 MG 24 hr tablet [id number] Yes Take 1 tablet (300 mg total) by mouth daily. Norman Clay, MD Taking Active   Continuous Blood Gluc Sensor (FREESTYLE LIBRE 14 DAY SENSOR) MISC [id number] Yes APPLY ONE SENSOR EVERY 14 DAYS TO MONITOR BLOOD GLUCOSE LEVELS. White Leone Haven, MD Taking Active   dapagliflozin propanediol (FARXIGA) 10 MG TABS tablet [id number] Yes Complete supply of Jardiance and then start Farxiga 10 mg daily Leone Haven, MD Taking Active   diphenoxylate-atropine (LOMOTIL) 2.5-0.025 MG tablet [id number] No Take 1 tablet by mouth in the morning and at bedtime.  Patient not taking: Reported on 02/16/2021   [provider] Not Taking Active   divalproex (DEPAKOTE ER) 500 MG 24 hr tablet [id number] Yes Take 1 tablet by mouth 2 (two) times daily. [provider] Taking Active   donepezil (ARICEPT) 10 MG tablet [id number] Yes Take 10 mg by mouth at bedtime. [provider] Taking Active Self  fenofibrate (TRICOR) 145 MG tablet [id number] Yes TAKE ONE TABLET BY MOUTH DAILY Leone Haven, MD Taking Active   gabapentin (NEURONTIN) 600 MG tablet [id number] Yes TAKE TWO TABLETS BY MOUTH THREE TIMES A DAY Leone Haven, MD Taking Active   HYDROcodone-acetaminophen [facility]) 10-325 MG tablet [id number] Yes Take 1 tablet by mouth every 4 (four) hours as needed for severe pain. Must last 30 days. [name], MD Taking Active   Hyoscyamine Sulfate SL 0.125 MG SUBL [id number] Yes Place 1 tablet under the tongue daily as needed. [provider] Taking Active   ipratropium (ATROVENT) 0.03 % nasal spray [id number] Yes Place 2 sprays into both nostrils every 12 (twelve) hours. Leone Haven, MD Taking Active   lisinopril-hydrochlorothiazide (ZESTORETIC) 20-25 MG tablet [id number] Yes Take 1 tablet by mouth  daily. Leone Haven, MD Taking Active   memantine [facility]) 10 MG tablet [id number] Yes Take 1 tablet by mouth daily. [provider] Taking Active   metoprolol succinate (TOPROL-XL) 25 MG 24 hr tablet [id number] Yes Take 25 mg by mouth daily. [provider] Taking Active   Multiple Vitamins-Minerals (CENTRUM SILVER PO) [id number] Yes Take 1 tablet by mouth daily. [provider] Taking Active   omega-3 acid ethyl esters (LOVAZA) 1 g capsule [id number] Yes Take 2 capsules (2 g total) by mouth 2 (two) times daily. Leone Haven, MD Taking Active   omeprazole (PRILOSEC) 20 MG capsule [id number] Yes Take 20 mg by mouth daily. [provider] Taking Active Self           Med Note (CH[facility], [facility]  Thu Aug 09, 2016 12:05 PM)    primidone (MYSOLINE) 50 MG tablet [id number] Yes Take 150-250 mg by mouth 2 (two) times daily. Take 250 mg by mouth in the morning & take 150 mg at night. [provider] Taking Active Self  QUEtiapine (SEROQUEL) 100 MG tablet [id number] Yes Take 150 mg by mouth at bedtime. [provider] Taking Active Pharmacy Records  Semaglutide,0.25 or 0.5MG /DOS, (OZEMPIC, 0.25 OR 0.5 MG/DOSE,) 2 MG/1.5ML SOPN [id number] Yes Inject 0.5 mg into the skin once a week. Leone Haven, MD Taking Active Pharmacy Records  simvastatin [facility]) 40 MG tablet [id number] Yes Take 40 mg by mouth daily. [provider] Taking Active   tamsulosin (FLOMAX) 0.4 MG CAPS capsule [id number] Yes TAKE ONE CAPSULE BY MOUTH DAILY AFTER Burnett Harry, MD Taking Active   traZODone (DESYREL) 100 MG tablet [id number] Yes 150-200 mg at night as needed for sleep  Patient taking differently: 200 mg. 150-200 mg at night as needed for sleep   Norman Clay, MD Taking Active   Med List Note [name], RN 11/15/20 5366): MR 02/21/2021 UDS 11/15/2020            Patient Active Problem List   Diagnosis Date Noted   Primary  osteoarthritis of left shoulder 01/25/2021   Rhinorrhea 01/11/2021   Skin lesions 01/11/2021   Chronic, continuous use of opioids 11/15/2020   Therapeutic opioid-induced constipation (OIC) 11/15/2020   Fatigue 10/11/2020   OAB (overactive bladder) 08/08/2020   AKI (acute kidney injury) ([place]) 08/08/2020   Leg cramps 08/08/2020   Prolonged Q-T interval on ECG 06/08/2020   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   Right leg pain 02/05/2020   Impingement of left ankle joint    Sleeping difficulty 08/24/2019   Hiccups 08/24/2019   Chronic pain of left ankle 02/27/2019   Vitamin D deficiency 02/18/2019   Chronic left shoulder pain 02/02/2019   Neuropathy 09/24/2018   Lumbosacral radiculopathy 01/14/2018   Primary osteoarthritis of left ankle 01/14/2018   Facet arthritis of lumbar region 01/14/2018   Chronic pain syndrome 08/26/2017   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   Thyroid nodule 02/27/2017   Fatty liver 02/27/2017   Purpura ([place]) 01/29/2017   Right hip pain 08/16/2016   Lumbar herniated disc 02/03/2016   Hypertriglyceridemia 12/09/2015   Anemia 12/09/2015   Falls 09/16/2015   Barrett's esophagus 09/16/2015   Chronic lumbar pain 09/16/2015   Benign prostatic hyperplasia 09/16/2015   Trochanteric bursitis of left hip 09/09/2015   Headache 09/09/2015   Difficulty in walking 06/01/2015   Amnesia 06/01/2015   HLD (hyperlipidemia) 02/23/2015   DJD (degenerative joint disease) of knee 11/15/2014   Hypertension    Tremor, essential    Primary localized osteoarthritis of right knee    Anxiety and depression    Absence of sensation 11/18/2013   Tobacco abuse 11/18/2013   Chronic diarrhea 11/12/2013   Benign neoplasm of colon 08/25/2013   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   Type 2 diabetes mellitus ([place]) 10/06/2012    Immunization History  Administered Date(s) Administered   Fluad Quad(high Dose 65+) 02/05/2020   Hepatitis A, Adult 12/02/2018   Influenza, High Dose Seasonal PF  01/29/2017, 03/19/2018, 01/10/2019   Influenza,inj,Quad PF,6+ Mos 03/13/2016   Influenza-Unspecified 01/21/2015   PFIZER(Purple Top)SARS-COV-2 Vaccination 06/01/2019, 06/22/2019, 03/09/2020, 08/02/2020   Pneumococcal Conjugate-13 02/07/2015   Pneumococcal Polysaccharide-23 01/18/2018   Tdap 09/16/2015, 01/18/2018   Zoster Recombinat (Shingrix) 01/03/2018, 05/09/2018   Zoster, Live 09/16/2015    Conditions to  be addressed/monitored: HTN, HLD, DMII, and Depression  Care Plan : Medication Management  Updates made by De Hollingshead, RPH-CPP since 02/16/2021 12:00 AM     Problem: Diabetes, Depression      Long-Range Goal: Disease Progression Prevention   Start Date: 02/16/2020  This Visit's Progress: On track  Recent Progress: On track  Priority: Medium  Note:   Current Barriers:  Unable to independently afford treatment regimen  Pharmacist Clinical Goal(s):  Over the next 30 days, patient will verbalize ability to afford treatment regimen through collaboration with PharmD and provider.  Over the next 90 days, patient will maintain glycemic control as evidenced by A1c through collaboration with PharmD and provider.  Interventions: 1:1 collaboration with Leone Haven, MD regarding development and update of comprehensive plan of care as evidenced by provider attestation and co-signature Inter-disciplinary care team collaboration (see longitudinal plan of care) Comprehensive medication review performed; medication list updated in electronic medical record  Diabetes: Controlled; current treatment: Farxiga 10 mg daily; Ozempic 0.5 mg weekly (max tolerated dose d/t GI upset/weight loss at 1 mg weekly)  Approved for Ozempic and [place] assistance.  Hx metformin ER - significant diarrhea that resolved upon discontinuation Current glucose readings: using Libre 14 day CGM, reports readings remain at target ~ 95% Recommended to continue current regimen at this time. Will plan to  reapply for assistance for 2023 later this year.   Hypertension: Controlled per last clinic reading; current treatment: lisinopril/HCTZ 20/25 mg daily, metoprolol succinate 25 mg daily Current home readings: has not checked at home recently, but have been at goal at recent provider visits. Denies any lightheadness, dizziness.   Recommended to continue current regimen at this time  Hyperlipidemia: Controlled; current treatment: rosuvastatin 40 mg daily, fenofibrate 145 mg daily, OTC omega 3 fatty acids 2 g BID Generic Lovaza and Vascepa too expensive. [employer] funding closed.  Recommended to continue current regimen at this time  Depression/insomnia: Improved, sleep improved per patient report. Current regimen: bupropion XL 300 mg daily, trazodone 200 mg QPM. Follows w/ Dr. Modesta Messing and LCSW Psychiatry previously discussed reducing dose of quetiapine to reduce risk of EPS. Patient declined.  Recommended to continue current collaboration with psychiatry.   Chronic Pain : Improved per patient report; hydrocodone/APAP 10/325 mg up to TID, gabapentin 1200 mg TID, reports he sometimes uses additional doses. Follows w/ Dr. Holley Raring Advised to limit gabapentin to prescribed amounts to avoid oversedation Recommended to continue current regimen at this time as prescribed  Neurologic Conditions (pseudo dementia, tremor, sleep disorder) Well managed per patient report; current regimen: memantine 10 mg BID, donepezil 10 mg daily; primidone 250 mg QAM, 150 mg QPM; divalproex 500 mg BID, quetiapine 150 mg QPM; Follows w/ Dr. Melrose Nakayama.  Previously recommended to continue current regimen at this time along with collaboration w/ Neurology  Chronic Diarrhea/Colitis, Barrett's: Controlled per patient report; current regimen: budesonide 6 mg daily, Lomotil 1 tab QAM, hyoscyamine PRN added at last visit; follows w/ [facility] Barrett's: omeprazole 20 mg daily Recommended to continue current regimen  at this time  OAB/BPH: Uncontrolled, notes some continued incomplete emptying and increased urinary frequency; current treatment regimen: tamsulosin 0.4 mg daily; follows w/ Dr. Diamantina [facility] Self discontinued Mybetriq and Gemtesa due to cost. There are no assistance options available. The only Tier 2 options available on his insurance at this time are oxybutynin XL, tolterodine ER, and trospium ER. Advised patient that these options have a higher anticholinergic burden and higher for potential side  effects, which would increase risk of sedation and falls in a patient on multiple sedating medications at baseline.  Recommended to continue current regimen at this time and continue to communicate with urology if further treatment is needed.   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 Provided motivational interviewing. Discussed potential for trial of varenicline. Initial studies indicated possible increase in risk of psychiatric side effects, but this black box warning has since been removed as more current literature has been unable to prove a definite risk. Would be appropriate to trial varenicline with follow up with PCP and psychiatry to ensure no worsening of psychiatric conditions. Patient interested. I will message PCP and Dr. Modesta Messing to discuss.    Patient Goals/Self-Care Activities Over the next 90 days, patient will:  - take medications as prescribed check glucose at least 3 times daily using CGM, document, and provide at future appointments collaborate with provider on medication access solutions engage in dietary modifications by reducing carbohydrate portion sizes Focus on tobacco cessation  Follow Up Plan: Telephone follow up appointment with care management team member scheduled for: pending medication management plan       Medication Assistance:  Ozempic obtained through  [employer] medication assistance program.  Enrollment ends 04/19/21 . [name] obtained through [employer] medication assistance program. Enrollment ends 05/20/21.  Patient's preferred pharmacy is:  [facility] [name], [place] [address] Phone: [phone] Fax: [phone]  [facility] [name], [place] - [address] [place] [address] Phone: [phone] Fax: [phone]  [facility] [address], [place] - [place] [address] Phone: [phone] Fax: [phone]  [place], [address] [place] [address] Phone: [phone] Fax: [phone]   Follow Up:  Patient agrees to Care Plan and Follow-up.  Plan: Telephone follow up appointment with care management team member scheduled for:  pending medication access  Catie Darnelle Maffucci, PharmD, [place], [place] Clinical Pharmacist [employer] at [facility] [phone]

## 2021-02-17 DIAGNOSIS — I10 Essential (primary) hypertension: Secondary | ICD-10-CM | POA: Diagnosis not present

## 2021-02-17 DIAGNOSIS — D0439 Carcinoma in situ of skin of other parts of face: Secondary | ICD-10-CM | POA: Diagnosis not present

## 2021-02-17 DIAGNOSIS — E1142 Type 2 diabetes mellitus with diabetic polyneuropathy: Secondary | ICD-10-CM

## 2021-02-17 DIAGNOSIS — D044 Carcinoma in situ of skin of scalp and neck: Secondary | ICD-10-CM | POA: Diagnosis not present

## 2021-02-17 DIAGNOSIS — L570 Actinic keratosis: Secondary | ICD-10-CM | POA: Diagnosis not present

## 2021-02-17 DIAGNOSIS — X32XXXA Exposure to sunlight, initial encounter: Secondary | ICD-10-CM | POA: Diagnosis not present

## 2021-02-17 DIAGNOSIS — F419 Anxiety disorder, unspecified: Secondary | ICD-10-CM

## 2021-02-17 DIAGNOSIS — F32A Depression, unspecified: Secondary | ICD-10-CM

## 2021-02-17 DIAGNOSIS — E782 Mixed hyperlipidemia: Secondary | ICD-10-CM | POA: Diagnosis not present

## 2021-02-17 DIAGNOSIS — L821 Other seborrheic keratosis: Secondary | ICD-10-CM | POA: Diagnosis not present

## 2021-02-17 DIAGNOSIS — N1831 Chronic kidney disease, stage 3a: Secondary | ICD-10-CM

## 2021-02-17 DIAGNOSIS — D485 Neoplasm of uncertain behavior of skin: Secondary | ICD-10-CM | POA: Diagnosis not present

## 2021-02-20 ENCOUNTER — Ambulatory Visit: Payer: PPO | Admitting: Pharmacist

## 2021-02-20 DIAGNOSIS — Z720 Tobacco use: Secondary | ICD-10-CM

## 2021-02-20 DIAGNOSIS — I10 Essential (primary) hypertension: Secondary | ICD-10-CM

## 2021-02-20 DIAGNOSIS — E782 Mixed hyperlipidemia: Secondary | ICD-10-CM

## 2021-02-20 DIAGNOSIS — E1142 Type 2 diabetes mellitus with diabetic polyneuropathy: Secondary | ICD-10-CM

## 2021-02-20 MED ORDER — VARENICLINE TARTRATE 1 MG PO TABS: ORAL_TABLET | ORAL | 2 refills | Status: DC

## 2021-02-20 NOTE — Chronic Care Management (AMB) (Signed)
Chronic Care Management Pharmacy Note  02/20/2021 Name:  James Hood MRN:  [id number] DOB:  07-11-1940    Subjective: James Hood is an 80 y.o. year old male who is a primary patient of Caryl Bis, Angela Adam, MD.  The CCM team was consulted for assistance with disease management and care coordination needs.    Engaged with patient by telephone for follow up visit in response to provider referral for pharmacy case management and/or care coordination services.   Consent to Services:  The patient was given information about Chronic Care Management services, agreed to services, and gave verbal consent prior to initiation of services.  Please see initial visit note for detailed documentation.   Patient Care Team: Leone Haven, MD as PCP - General (Family Medicine) De Hollingshead, RPH-CPP as Pharmacist (Pharmacist)    Objective:  Lab Results  Component Value Date   CREATININE 1.11 10/11/2020   CREATININE 1.47 09/08/2020   CREATININE 1.21 07/20/2020    Lab Results  Component Value Date   HGBA1C 6.8 (A) 01/11/2021   Last diabetic Eye exam:  Lab Results  Component Value Date/Time   HMDIABEYEEXA No Retinopathy 01/27/2020 03:37 PM    Last diabetic Foot exam: No results found for: HMDIABFOOTEX      Component Value Date/Time   CHOL 99 07/05/2020 1209   TRIG 226.0 (H) 07/05/2020 1209   HDL 43.20 07/05/2020 1209   CHOLHDL 2 07/05/2020 1209   VLDL 45.2 (H) 07/05/2020 1209   LDLCALC 57 09/25/2017 0924   LDLDIRECT 31.0 07/05/2020 1209    Hepatic Function Latest Ref Rng & Units 10/11/2020 06/08/2020 05/31/2020  Total Protein 6.0 - 8.3 g/dL 6.2 6.9 5.8(L)  Albumin 3.5 - 5.2 g/dL 4.1 3.3(L) 2.8(L)  AST 0 - 37 U/L _0 ALT 0 - 53 U/L _1 Alk Phosphatase 39 - 117 U/L 41 58 32(L)  Total Bilirubin 0.2 - 1.2 mg/dL 0.3 0.8 0.7  Bilirubin, Direct 0.0 - 0.3 mg/dL - - -    Lab Results  Component Value Date/Time   TSH 0.97 10/11/2020 10:08 AM   TSH  0.346 (L) 05/31/2020 12:34 AM   TSH 1.36 07/20/2019 09:02 AM    CBC Latest Ref Rng & Units 10/11/2020 06/08/2020 06/01/2020  WBC 4.0 - 10.5 K/uL 5.8 6.5 6.7  Hemoglobin 13.0 - 17.0 g/dL 12.6(L) 11.8(L) 10.2(L)  Hematocrit 39.0 - 52.0 % 37.5(L) 35.7(L) 31.0(L)  Platelets 150.0 - 400.0 K/uL 249.0 375 203    Lab Results  Component Value Date/Time   VD25OH 24.57 (L) 10/11/2020 10:08 AM   VD25OH 42.83 04/07/2019 08:54 AM     Social History   Tobacco Use  Smoking Status Every Day   Packs/day: 0.50   Years: 60.00   Pack years: 30.00   Types: Cigarettes  Smokeless Tobacco Never  Tobacco Comments   Last one at 0400   BP Readings from Last 3 Encounters:  02/08/21 124/78  02/01/21 123/80  01/30/21 96/60   Pulse Readings from Last 3 Encounters:  02/01/21 82  01/30/21 87  01/11/21 64   Wt Readings from Last 3 Encounters:  02/01/21 165 lb (74.8 kg)  01/30/21 165 lb 9.6 oz (75.1 kg)  01/11/21 165 lb 3.2 oz (74.9 kg)    Assessment: Review of patient past medical history, allergies, medications, health status, including review of consultants reports, laboratory and other test data, was performed as part of comprehensive evaluation and provision of chronic care management services.  SDOH:  (Social Determinants of Health) assessments and interventions performed:  SDOH Interventions    Flowsheet Row Most Recent Value  SDOH Interventions   Financial Strain Interventions Other (Comment)  [manufacturer assistance]       CCM Care Plan  No Known Allergies  Medications Reviewed Today     Reviewed by De Hollingshead, RPH-CPP (Pharmacist) on 02/16/21 at 1344  Med List Status: <None>   Medication Order Taking? Sig Documenting Provider Last Dose Status Informant  aspirin EC 81 MG tablet [id number] Yes Take 81 mg by mouth daily. [provider] Taking Active            Med Note Vanessa [place], SUSAN   Tue Apr 15, 2018  8:47 AM)    budesonide (ENTOCORT EC) 3 MG 24 hr capsule  [id number] Yes Take 6 mg by mouth daily. [provider] Taking Active   buPROPion (WELLBUTRIN XL) 300 MG 24 hr tablet [id number] Yes Take 1 tablet (300 mg total) by mouth daily. Norman Clay, MD Taking Active   Continuous Blood Gluc Sensor (FREESTYLE LIBRE 14 DAY SENSOR) MISC [id number] Yes APPLY ONE SENSOR EVERY 14 DAYS TO MONITOR BLOOD GLUCOSE LEVELS. [place] Leone Haven, MD Taking Active   dapagliflozin propanediol (FARXIGA) 10 MG TABS tablet [id number] Yes Complete supply of Jardiance and then start Farxiga 10 mg daily Leone Haven, MD Taking Active   diphenoxylate-atropine (LOMOTIL) 2.5-0.025 MG tablet [id number] No Take 1 tablet by mouth in the morning and at bedtime.  Patient not taking: Reported on 02/16/2021   [provider] Not Taking Active   divalproex (DEPAKOTE ER) 500 MG 24 hr tablet [id number] Yes Take 1 tablet by mouth 2 (two) times daily. [provider] Taking Active   donepezil (ARICEPT) 10 MG tablet [id number] Yes Take 10 mg by mouth at bedtime. [provider] Taking Active Self  fenofibrate (TRICOR) 145 MG tablet [id number] Yes TAKE ONE TABLET BY MOUTH DAILY Leone Haven, MD Taking Active   gabapentin (NEURONTIN) 600 MG tablet [id number] Yes TAKE TWO TABLETS BY MOUTH THREE TIMES A DAY Leone Haven, MD Taking Active   HYDROcodone-acetaminophen [facility]) 10-325 MG tablet [id number] Yes Take 1 tablet by mouth every 4 (four) hours as needed for severe pain. Must last 30 days. [name], MD Taking Active   Hyoscyamine Sulfate SL 0.125 MG SUBL [id number] Yes Place 1 tablet under the tongue daily as needed. [provider] Taking Active   ipratropium (ATROVENT) 0.03 % nasal spray [id number] Yes Place 2 sprays into both nostrils every 12 (twelve) hours. Leone Haven, MD Taking Active   lisinopril-hydrochlorothiazide (ZESTORETIC) 20-25 MG tablet [id number] Yes Take 1 tablet by mouth  daily. Leone Haven, MD Taking Active   memantine [facility]) 10 MG tablet [id number] Yes Take 1 tablet by mouth daily. [provider] Taking Active   metoprolol succinate (TOPROL-XL) 25 MG 24 hr tablet [id number] Yes Take 25 mg by mouth daily. [provider] Taking Active   Multiple Vitamins-Minerals (CENTRUM SILVER PO) [id number] Yes Take 1 tablet by mouth daily. [provider] Taking Active   omega-3 acid ethyl esters (LOVAZA) 1 g capsule [id number] Yes Take 2 capsules (2 g total) by mouth 2 (two) times daily. Leone Haven, MD Taking Active   omeprazole (PRILOSEC) 20 MG capsule [id number] Yes Take 20 mg by mouth daily. [provider] Taking Active Self           Med Note (  CHAMBERS, KIMBERLY   Thu Aug 09, 2016 12:05 PM)    primidone (MYSOLINE) 50 MG tablet [id number] Yes Take 150-250 mg by mouth 2 (two) times daily. Take 250 mg by mouth in the morning & take 150 mg at night. [provider] Taking Active Self  QUEtiapine (SEROQUEL) 100 MG tablet [id number] Yes Take 150 mg by mouth at bedtime. [provider] Taking Active Pharmacy Records  Semaglutide,0.25 or 0.5MG/DOS, (OZEMPIC, 0.25 OR 0.5 MG/DOSE,) 2 MG/1.5ML SOPN [id number] Yes Inject 0.5 mg into the skin once a week. Leone Haven, MD Taking Active Pharmacy Records  simvastatin [facility]) 40 MG tablet [id number] Yes Take 40 mg by mouth daily. [provider] Taking Active   tamsulosin (FLOMAX) 0.4 MG CAPS capsule [id number] Yes TAKE ONE CAPSULE BY MOUTH DAILY AFTER Burnett Harry, MD Taking Active   traZODone (DESYREL) 100 MG tablet [id number] Yes 150-200 mg at night as needed for sleep  Patient taking differently: 200 mg. 150-200 mg at night as needed for sleep   Norman Clay, MD Taking Active   Med List Note [name], RN 11/15/20 0354): MR 02/21/2021 UDS 11/15/2020            Patient Active Problem List   Diagnosis Date Noted   Primary  osteoarthritis of left shoulder 01/25/2021   Rhinorrhea 01/11/2021   Skin lesions 01/11/2021   Chronic, continuous use of opioids 11/15/2020   Therapeutic opioid-induced constipation (OIC) 11/15/2020   Fatigue 10/11/2020   OAB (overactive bladder) 08/08/2020   AKI (acute kidney injury) ([place]) 08/08/2020   Leg cramps 08/08/2020   Prolonged Q-T interval on ECG 06/08/2020   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   Right leg pain 02/05/2020   Impingement of left ankle joint    Sleeping difficulty 08/24/2019   Hiccups 08/24/2019   Chronic pain of left ankle 02/27/2019   Vitamin D deficiency 02/18/2019   Chronic left shoulder pain 02/02/2019   Neuropathy 09/24/2018   Lumbosacral radiculopathy 01/14/2018   Primary osteoarthritis of left ankle 01/14/2018   Facet arthritis of lumbar region 01/14/2018   Chronic pain syndrome 08/26/2017   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   Thyroid nodule 02/27/2017   Fatty liver 02/27/2017   Purpura ([place]) 01/29/2017   Right hip pain 08/16/2016   Lumbar herniated disc 02/03/2016   Hypertriglyceridemia 12/09/2015   Anemia 12/09/2015   Falls 09/16/2015   Barrett's esophagus 09/16/2015   Chronic lumbar pain 09/16/2015   Benign prostatic hyperplasia 09/16/2015   Trochanteric bursitis of left hip 09/09/2015   Headache 09/09/2015   Difficulty in walking 06/01/2015   Amnesia 06/01/2015   HLD (hyperlipidemia) 02/23/2015   DJD (degenerative joint disease) of knee 11/15/2014   Hypertension    Tremor, essential    Primary localized osteoarthritis of right knee    Anxiety and depression    Absence of sensation 11/18/2013   Tobacco abuse 11/18/2013   Chronic diarrhea 11/12/2013   Benign neoplasm of colon 08/25/2013   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   Type 2 diabetes mellitus ([place]) 10/06/2012    Immunization History  Administered Date(s) Administered   Fluad Quad(high Dose 65+) 02/05/2020   Hepatitis A, Adult 12/02/2018   Influenza, High Dose Seasonal PF  01/29/2017, 03/19/2018, 01/10/2019   Influenza,inj,Quad PF,6+ Mos 03/13/2016   Influenza-Unspecified 01/21/2015   PFIZER(Purple Top)SARS-COV-2 Vaccination 06/01/2019, 06/22/2019, 03/09/2020, 08/02/2020   Pneumococcal Conjugate-13 02/07/2015   Pneumococcal Polysaccharide-23 01/18/2018   Tdap 09/16/2015, 01/18/2018   Zoster Recombinat (Shingrix) 01/03/2018, 05/09/2018   Zoster, Live 09/16/2015  Conditions to be addressed/monitored: HTN, HLD, and DMII  Care Plan : Medication Management  Updates made by De Hollingshead, RPH-CPP since 02/20/2021 12:00 AM     Problem: Diabetes, Depression      Long-Range Goal: Disease Progression Prevention   Start Date: 02/16/2020  This Visit's Progress: On track  Recent Progress: On track  Priority: Medium  Note:   Current Barriers:  Unable to independently afford treatment regimen  Pharmacist Clinical Goal(s):  Over the next 30 days, patient will verbalize ability to afford treatment regimen through collaboration with PharmD and provider.  Over the next 90 days, patient will maintain glycemic control as evidenced by A1c through collaboration with PharmD and provider.  Interventions: 1:1 collaboration with Leone Haven, MD regarding development and update of comprehensive plan of care as evidenced by provider attestation and co-signature Inter-disciplinary care team collaboration (see longitudinal plan of care) Comprehensive medication review performed; medication list updated in electronic medical record  Diabetes: Controlled; current treatment: Farxiga 10 mg daily; Ozempic 0.5 mg weekly (max tolerated dose d/t GI upset/weight loss at 1 mg weekly)  Approved for Ozempic and [place] assistance.  Hx metformin ER - significant diarrhea that resolved upon discontinuation Current glucose readings: using Libre 14 day CGM, reports readings remain at target ~ 95% Previously recommended to continue current regimen at this time. Will plan to  reapply for assistance for 2023 later this year.   Hypertension: Controlled per last clinic reading; current treatment: lisinopril/HCTZ 20/25 mg daily, metoprolol succinate 25 mg daily Current home readings: has not checked at home recently, but have been at goal at recent provider visits. Denies any lightheadness, dizziness.   Previously recommended to continue current regimen at this time  Hyperlipidemia: Controlled; current treatment: rosuvastatin 40 mg daily, fenofibrate 145 mg daily, OTC omega 3 fatty acids 2 g BID Generic Lovaza and Vascepa too expensive. [employer] funding closed.  Previously recommended to continue current regimen at this time  Depression/insomnia: Improved, sleep improved per patient report. Current regimen: bupropion XL 300 mg daily, trazodone 200 mg QPM. Follows w/ Dr. Modesta Messing and LCSW Psychiatry previously discussed reducing dose of quetiapine to reduce risk of EPS. Patient declined.  Previously recommended to continue current collaboration with psychiatry.   Chronic Pain : Improved per patient report; hydrocodone/APAP 10/325 mg up to TID, gabapentin 1200 mg TID, reports he sometimes uses additional doses. Follows w/ Dr. Holley Raring Advised to limit gabapentin to prescribed amounts to avoid oversedation Previously recommended to continue current regimen at this time as prescribed  Neurologic Conditions (pseudo dementia, tremor, sleep disorder) Well managed per patient report; current regimen: memantine 10 mg BID, donepezil 10 mg daily; primidone 250 mg QAM, 150 mg QPM; divalproex 500 mg BID, quetiapine 150 mg QPM; Follows w/ Dr. Melrose Nakayama.  Previously recommended to continue current regimen at this time along with collaboration w/ Neurology  Chronic Diarrhea/Colitis, Barrett's: Controlled per patient report; current regimen: budesonide 6 mg daily, Lomotil 1 tab QAM, hyoscyamine PRN added at last visit; follows w/ [facility] Barrett's: omeprazole 20 mg  daily Previously recommended to continue current regimen at this time  OAB/BPH: Uncontrolled, notes some continued incomplete emptying and increased urinary frequency; current treatment regimen: tamsulosin 0.4 mg daily; follows w/ Dr. Diamantina [facility] Self discontinued Mybetriq and Gemtesa due to cost. There are no assistance options available. The only Tier 2 options available on his insurance at this time are oxybutynin XL, tolterodine ER, and trospium ER. Advised patient that these options have a higher  anticholinergic burden and higher for potential side effects, which would increase risk of sedation and falls in a patient on multiple sedating medications at baseline.  Previously recommended to continue current regimen at this time and continue to communicate with urology if further treatment is needed.   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 Discussed with Dr. Caryl Bis and Dr. Modesta Messing, both are in agreement to start varenicline. Start varenicline 0.5 mg daily for 3 days, then 0.5 mg BID for 4 days, then 1 mg BID thereafter. Advised that he could consider setting a quit date within the first month, however, if he is unable to quit then we do not have to stop the medication. Advised to monitor for changes in mood.    Patient Goals/Self-Care Activities Over the next 90 days, patient will:  - take medications as prescribed check glucose at least 3 times daily using CGM, document, and provide at future appointments collaborate with provider on medication access solutions engage in dietary modifications by reducing carbohydrate portion sizes Focus on tobacco cessation  Follow Up Plan: Telephone follow up appointment with care management team member scheduled for: 4 weeks      Medication Assistance: None required.  Patient affirms current coverage meets needs.  Patient's preferred  pharmacy is:  [facility] [name], [place] [address] Phone: [phone] Fax: [phone]  [id number] [name], [place] - [address] [place] [address] Phone: [phone] Fax: [phone]  [facility] [address], [place] - [place] [address] Phone: [phone] Fax: [phone]  [facility], [address] [place] [address] Phone: [phone] Fax: [phone]   Follow Up:  Patient agrees to Care Plan and Follow-up.  Plan: Telephone follow up appointment with care management team member scheduled for:  ~4 weeks Catie Darnelle Maffucci, PharmD, [place], [place] Clinical Pharmacist [employer] [facility] [employer] [phone]

## 2021-02-20 NOTE — Patient Instructions (Signed)
Visit Information  PATIENT GOALS:  Goals Addressed               This Visit's Progress     Patient Stated     Medication Monitoring (pt-stated)        Patient Goals/Self-Care Activities Over the next 90 days, patient will:  - take medications as prescribed check glucose at least 3 times daily using CGM, document, and provide at future appointments collaborate with provider on medication access solutions engage in dietary modifications by reducing carbohydrate portion sizes Focus on tobacco cessation        Patient verbalizes understanding of instructions provided today and agrees to view in [place].    Plan: Telephone follow up appointment with care management team member scheduled for:  ~4 weeks Catie Darnelle Maffucci, PharmD, [place], [place] Clinical Pharmacist [employer] [facility] [employer] [phone]

## 2021-02-21 ENCOUNTER — Ambulatory Visit (INDEPENDENT_AMBULATORY_CARE_PROVIDER_SITE_OTHER): Payer: PPO | Admitting: Pharmacist

## 2021-02-21 DIAGNOSIS — I10 Essential (primary) hypertension: Secondary | ICD-10-CM

## 2021-02-21 DIAGNOSIS — E1142 Type 2 diabetes mellitus with diabetic polyneuropathy: Secondary | ICD-10-CM

## 2021-02-21 DIAGNOSIS — Z720 Tobacco use: Secondary | ICD-10-CM

## 2021-02-21 NOTE — Chronic Care Management (AMB) (Signed)
Chronic Care Management Pharmacy Note  02/21/2021 Name:  James Hood MRN:  [id number] DOB:  1941/05/15   Subjective: James Hood is an 81 y.o. year old male who is a primary patient of Caryl Bis, Angela Adam, MD.  The CCM team was consulted for assistance with disease management and care coordination needs.    Engaged with patient by telephone for  follow up  in response to provider referral for pharmacy case management and/or care coordination services.   Consent to Services:  The patient was given information about Chronic Care Management services, agreed to services, and gave verbal consent prior to initiation of services.  Please see initial visit note for detailed documentation.   Patient Care Team: Leone Haven, MD as PCP - General (Family Medicine) De Hollingshead, RPH-CPP as Pharmacist (Pharmacist)   Objective:  Lab Results  Component Value Date   CREATININE 1.11 10/11/2020   CREATININE 1.47 09/08/2020   CREATININE 1.21 07/20/2020    Lab Results  Component Value Date   HGBA1C 6.8 (A) 01/11/2021   Last diabetic Eye exam:  Lab Results  Component Value Date/Time   HMDIABEYEEXA No Retinopathy 01/27/2020 03:37 PM    Last diabetic Foot exam: No results found for: HMDIABFOOTEX      Component Value Date/Time   CHOL 99 07/05/2020 1209   TRIG 226.0 (H) 07/05/2020 1209   HDL 43.20 07/05/2020 1209   CHOLHDL 2 07/05/2020 1209   VLDL 45.2 (H) 07/05/2020 1209   LDLCALC 57 09/25/2017 0924   LDLDIRECT 31.0 07/05/2020 1209    Hepatic Function Latest Ref Rng & Units 10/11/2020 06/08/2020 05/31/2020  Total Protein 6.0 - 8.3 g/dL 6.2 6.9 5.8(L)  Albumin 3.5 - 5.2 g/dL 4.1 3.3(L) 2.8(L)  AST 0 - 37 U/L _0 ALT 0 - 53 U/L _1 Alk Phosphatase 39 - 117 U/L 41 58 32(L)  Total Bilirubin 0.2 - 1.2 mg/dL 0.3 0.8 0.7  Bilirubin, Direct 0.0 - 0.3 mg/dL - - -    Lab Results  Component Value Date/Time   TSH 0.97 10/11/2020 10:08 AM   TSH 0.346 (L)  05/31/2020 12:34 AM   TSH 1.36 07/20/2019 09:02 AM    CBC Latest Ref Rng & Units 10/11/2020 06/08/2020 06/01/2020  WBC 4.0 - 10.5 K/uL 5.8 6.5 6.7  Hemoglobin 13.0 - 17.0 g/dL 12.6(L) 11.8(L) 10.2(L)  Hematocrit 39.0 - 52.0 % 37.5(L) 35.7(L) 31.0(L)  Platelets 150.0 - 400.0 K/uL 249.0 375 203    Lab Results  Component Value Date/Time   VD25OH 24.57 (L) 10/11/2020 10:08 AM   VD25OH 42.83 04/07/2019 08:54 AM     Social History   Tobacco Use  Smoking Status Every Day   Packs/day: 0.50   Years: 60.00   Pack years: 30.00   Types: Cigarettes  Smokeless Tobacco Never  Tobacco Comments   Last one at 0400   BP Readings from Last 3 Encounters:  02/08/21 124/78  02/01/21 123/80  01/30/21 96/60   Pulse Readings from Last 3 Encounters:  02/01/21 82  01/30/21 87  01/11/21 64   Wt Readings from Last 3 Encounters:  02/01/21 165 lb (74.8 kg)  01/30/21 165 lb 9.6 oz (75.1 kg)  01/11/21 165 lb 3.2 oz (74.9 kg)    Assessment: Review of patient past medical history, allergies, medications, health status, including review of consultants reports, laboratory and other test data, was performed as part of comprehensive evaluation and provision of chronic care management services.  SDOH:  (Social Determinants of Health) assessments and interventions performed:    CCM Care Plan  No Known Allergies  Medications Reviewed Today     Reviewed by De Hollingshead, RPH-CPP (Pharmacist) on 02/16/21 at 1344  Med List Status: <None>   Medication Order Taking? Sig Documenting Provider Last Dose Status Informant  aspirin EC 81 MG tablet [id number] Yes Take 81 mg by mouth daily. [provider] Taking Active            Med Note Vanessa Grabill, SUSAN   Tue Apr 15, 2018  8:47 AM)    budesonide (ENTOCORT EC) 3 MG 24 hr capsule [id number] Yes Take 6 mg by mouth daily. [provider] Taking Active   buPROPion (WELLBUTRIN XL) 300 MG 24 hr tablet [id number] Yes Take 1 tablet (300 mg total)  by mouth daily. Norman Clay, MD Taking Active   Continuous Blood Gluc Sensor (FREESTYLE LIBRE 14 DAY SENSOR) MISC [id number] Yes APPLY ONE SENSOR EVERY 14 DAYS TO MONITOR BLOOD GLUCOSE LEVELS. Victoria Leone Haven, MD Taking Active   dapagliflozin propanediol (FARXIGA) 10 MG TABS tablet [id number] Yes Complete supply of Jardiance and then start Farxiga 10 mg daily Leone Haven, MD Taking Active   diphenoxylate-atropine (LOMOTIL) 2.5-0.025 MG tablet [id number] No Take 1 tablet by mouth in the morning and at bedtime.  Patient not taking: Reported on 02/16/2021   [provider] Not Taking Active   divalproex (DEPAKOTE ER) 500 MG 24 hr tablet [id number] Yes Take 1 tablet by mouth 2 (two) times daily. [provider] Taking Active   donepezil (ARICEPT) 10 MG tablet [id number] Yes Take 10 mg by mouth at bedtime. [provider] Taking Active Self  fenofibrate (TRICOR) 145 MG tablet [id number] Yes TAKE ONE TABLET BY MOUTH DAILY Leone Haven, MD Taking Active   gabapentin (NEURONTIN) 600 MG tablet [id number] Yes TAKE TWO TABLETS BY MOUTH THREE TIMES A DAY Leone Haven, MD Taking Active   HYDROcodone-acetaminophen [facility]) 10-325 MG tablet [id number] Yes Take 1 tablet by mouth every 4 (four) hours as needed for severe pain. Must last 30 days. [name], MD Taking Active   Hyoscyamine Sulfate SL 0.125 MG SUBL [id number] Yes Place 1 tablet under the tongue daily as needed. [provider] Taking Active   ipratropium (ATROVENT) 0.03 % nasal spray [id number] Yes Place 2 sprays into both nostrils every 12 (twelve) hours. Leone Haven, MD Taking Active   lisinopril-hydrochlorothiazide (ZESTORETIC) 20-25 MG tablet [id number] Yes Take 1 tablet by mouth daily. Leone Haven, MD Taking Active   memantine [facility]) 10 MG tablet [id number] Yes Take 1 tablet by mouth daily. [provider] Taking Active    metoprolol succinate (TOPROL-XL) 25 MG 24 hr tablet [id number] Yes Take 25 mg by mouth daily. [provider] Taking Active   Multiple Vitamins-Minerals (CENTRUM SILVER PO) [id number] Yes Take 1 tablet by mouth daily. [provider] Taking Active   omega-3 acid ethyl esters (LOVAZA) 1 g capsule [id number] Yes Take 2 capsules (2 g total) by mouth 2 (two) times daily. Leone Haven, MD Taking Active   omeprazole (PRILOSEC) 20 MG capsule [id number] Yes Take 20 mg by mouth daily. [provider] Taking Active Self           Med Note Josiah Lobo, KIMBERLY   Thu Aug 09, 2016 12:05 PM)    primidone (MYSOLINE) 50 MG tablet [id number] Yes Take 150-250 mg by mouth 2 (two)  times daily. Take 250 mg by mouth in the morning & take 150 mg at night. [provider] Taking Active Self  QUEtiapine (SEROQUEL) 100 MG tablet [id number] Yes Take 150 mg by mouth at bedtime. [provider] Taking Active Pharmacy Records  Semaglutide,0.25 or 0.5MG/DOS, (OZEMPIC, 0.25 OR 0.5 MG/DOSE,) 2 MG/1.5ML SOPN [id number] Yes Inject 0.5 mg into the skin once a week. Leone Haven, MD Taking Active Pharmacy Records  simvastatin [facility]) 40 MG tablet [id number] Yes Take 40 mg by mouth daily. [provider] Taking Active   tamsulosin (FLOMAX) 0.4 MG CAPS capsule [id number] Yes TAKE ONE CAPSULE BY MOUTH DAILY AFTER Burnett Harry, MD Taking Active   traZODone (DESYREL) 100 MG tablet [id number] Yes 150-200 mg at night as needed for sleep  Patient taking differently: 200 mg. 150-200 mg at night as needed for sleep   Norman Clay, MD Taking Active   Med List Note [name], RN 11/15/20 1224): MR 02/21/2021 UDS 11/15/2020            Patient Active Problem List   Diagnosis Date Noted   Primary osteoarthritis of left shoulder 01/25/2021   Rhinorrhea 01/11/2021   Skin lesions 01/11/2021   Chronic, continuous use of opioids 11/15/2020   Therapeutic opioid-induced  constipation (OIC) 11/15/2020   Fatigue 10/11/2020   OAB (overactive bladder) 08/08/2020   AKI (acute kidney injury) ([place]) 08/08/2020   Leg cramps 08/08/2020   Prolonged Q-T interval on ECG 06/08/2020   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   Right leg pain 02/05/2020   Impingement of left ankle joint    Sleeping difficulty 08/24/2019   Hiccups 08/24/2019   Chronic pain of left ankle 02/27/2019   Vitamin D deficiency 02/18/2019   Chronic left shoulder pain 02/02/2019   Neuropathy 09/24/2018   Lumbosacral radiculopathy 01/14/2018   Primary osteoarthritis of left ankle 01/14/2018   Facet arthritis of lumbar region 01/14/2018   Chronic pain syndrome 08/26/2017   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   Thyroid nodule 02/27/2017   Fatty liver 02/27/2017   Purpura ([place]) 01/29/2017   Right hip pain 08/16/2016   Lumbar herniated disc 02/03/2016   Hypertriglyceridemia 12/09/2015   Anemia 12/09/2015   Falls 09/16/2015   Barrett's esophagus 09/16/2015   Chronic lumbar pain 09/16/2015   Benign prostatic hyperplasia 09/16/2015   Trochanteric bursitis of left hip 09/09/2015   Headache 09/09/2015   Difficulty in walking 06/01/2015   Amnesia 06/01/2015   HLD (hyperlipidemia) 02/23/2015   DJD (degenerative joint disease) of knee 11/15/2014   Hypertension    Tremor, essential    Primary localized osteoarthritis of right knee    Anxiety and depression    Absence of sensation 11/18/2013   Tobacco abuse 11/18/2013   Chronic diarrhea 11/12/2013   Benign neoplasm of colon 08/25/2013   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   Type 2 diabetes mellitus ([place]) 10/06/2012    Immunization History  Administered Date(s) Administered   Fluad Quad(high Dose 65+) 02/05/2020   Hepatitis A, Adult 12/02/2018   Influenza, High Dose Seasonal PF 01/29/2017, 03/19/2018, 01/10/2019   Influenza,inj,Quad PF,6+ Mos 03/13/2016   Influenza-Unspecified 01/21/2015   PFIZER(Purple Top)SARS-COV-2 Vaccination 06/01/2019,  06/22/2019, 03/09/2020, 08/02/2020   Pneumococcal Conjugate-13 02/07/2015   Pneumococcal Polysaccharide-23 01/18/2018   Tdap 09/16/2015, 01/18/2018   Zoster Recombinat (Shingrix) 01/03/2018, 05/09/2018   Zoster, Live 09/16/2015    Conditions to be addressed/monitored: HLD, DMII, Depression, and tobacco use  Care Plan : Medication Management  Updates made by De Hollingshead, RPH-CPP since  02/21/2021 12:00 AM     Problem: Diabetes, Depression      Long-Range Goal: Disease Progression Prevention   Start Date: 02/16/2020  Recent Progress: On track  Priority: Medium  Note:   Current Barriers:  Unable to independently afford treatment regimen  Pharmacist Clinical Goal(s):  Over the next 30 days, patient will verbalize ability to afford treatment regimen through collaboration with PharmD and provider.  Over the next 90 days, patient will maintain glycemic control as evidenced by A1c through collaboration with PharmD and provider.  Interventions: 1:1 collaboration with Leone Haven, MD regarding development and update of comprehensive plan of care as evidenced by provider attestation and co-signature Inter-disciplinary care team collaboration (see longitudinal plan of care) Comprehensive medication review performed; medication list updated in electronic medical record  Diabetes: Controlled; current treatment: Farxiga 10 mg daily; Ozempic 0.5 mg weekly (max tolerated dose d/t GI upset/weight loss at 1 mg weekly)  Approved for Ozempic and [place] assistance.  Hx metformin ER - significant diarrhea that resolved upon discontinuation Current glucose readings: using Libre 14 day CGM, reports readings remain at target ~ 95% Previously recommended to continue current regimen at this time. Will plan to reapply for assistance for 2023 later this year.   Hypertension: Controlled per last clinic reading; current treatment: lisinopril/HCTZ 20/25 mg daily, metoprolol succinate 25 mg  daily Current home readings: has not checked at home recently, but have been at goal at recent provider visits. Denies any lightheadness, dizziness.   Previously recommended to continue current regimen at this time  Hyperlipidemia: Controlled; current treatment: rosuvastatin 40 mg daily, fenofibrate 145 mg daily, OTC omega 3 fatty acids 2 g BID Generic Lovaza and Vascepa too expensive. [place] funding closed.  Previously recommended to continue current regimen at this time  Depression/insomnia: Improved, sleep improved per patient report. Current regimen: bupropion XL 300 mg daily, trazodone 200 mg QPM. Follows w/ Dr. Modesta Messing and LCSW Psychiatry previously discussed reducing dose of quetiapine to reduce risk of EPS. Patient declined.  Previously recommended to continue current collaboration with psychiatry.   Chronic Pain : Improved per patient report; hydrocodone/APAP 10/325 mg up to TID, gabapentin 1200 mg TID, reports he sometimes uses additional doses. Follows w/ Dr. Holley Raring Advised to limit gabapentin to prescribed amounts to avoid oversedation Previously recommended to continue current regimen at this time as prescribed  Neurologic Conditions (pseudo dementia, tremor, sleep disorder) Well managed per patient report; current regimen: memantine 10 mg BID, donepezil 10 mg daily; primidone 250 mg QAM, 150 mg QPM; divalproex 500 mg BID, quetiapine 150 mg QPM; Follows w/ Dr. Melrose Nakayama.  Previously recommended to continue current regimen at this time along with collaboration w/ Neurology  Chronic Diarrhea/Colitis, Barrett's: Controlled per patient report; current regimen: budesonide 6 mg daily, Lomotil 1 tab QAM, hyoscyamine PRN added at last visit; follows w/ [facility] Barrett's: omeprazole 20 mg daily Previously recommended to continue current regimen at this time  OAB/BPH: Uncontrolled, notes some continued incomplete emptying and increased urinary frequency; current  treatment regimen: tamsulosin 0.4 mg daily; follows w/ Dr. Diamantina [facility] Self discontinued Mybetriq and Gemtesa due to cost. There are no assistance options available. The only Tier 2 options available on his insurance at this time are oxybutynin XL, tolterodine ER, and trospium ER. Advised patient that these options have a higher anticholinergic burden and higher for potential side effects, which would increase risk of sedation and falls in a patient on multiple sedating medications at baseline.  Previously recommended to  continue current regimen at this time and continue to communicate with urology if further treatment is needed.   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 Calls today to report varenicline script is $100 for a month supply, as he is in the coverage gap. [employer] patient assistance still supplies Chantix, but is currently on backorder due to prior recall on brand name product. Will investigate other options for cost reduction for varenicline. Patient amenable.    Patient Goals/Self-Care Activities Over the next 90 days, patient will:  - take medications as prescribed check glucose at least 3 times daily using CGM, document, and provide at future appointments collaborate with provider on medication access solutions engage in dietary modifications by reducing carbohydrate portion sizes Focus on tobacco cessation  Follow Up Plan: Telephone follow up appointment with care management team member scheduled for: 4 weeks as previously scheduled      Medication Assistance:  Venia Minks obtained through [employer] medication assistance program.  Enrollment ends 04/19/21  Patient's preferred pharmacy is:  [facility] [name], [place] [address] Phone: [phone] Fax:  [id number] [name], [address] [place] [address] Phone: [phone] Fax: [phone]  [facility] [address], [place] - [place] [address] Phone: [phone] Fax: [phone]  [place], [place] [address] [address] Phone: [phone] Fax: [phone]    Follow Up:  Patient agrees to Care Plan and Follow-up.  Plan: Telephone follow up appointment with care management team member scheduled for:  4 weeks as previously scheduled  Catie Darnelle Maffucci, PharmD, [place], [place] Clinical Pharmacist [employer] [facility] [employer] [phone]

## 2021-02-21 NOTE — Patient Instructions (Signed)
Visit Information  PATIENT GOALS:  Goals Addressed               This Visit's Progress     Patient Stated     Medication Monitoring (pt-stated)        Patient Goals/Self-Care Activities Over the next 90 days, patient will:  - take medications as prescribed check glucose at least 3 times daily using CGM, document, and provide at future appointments collaborate with provider on medication access solutions engage in dietary modifications by reducing carbohydrate portion sizes Focus on tobacco cessation         Patient verbalizes understanding of instructions provided today and agrees to view in [place].    Plan: Telephone follow up appointment with care management team member scheduled for:  4 weeks as previously scheduled  Catie Darnelle Maffucci, PharmD, [place], [place] Clinical Pharmacist [employer] [facility] [employer] [phone]

## 2021-02-23 ENCOUNTER — Telehealth: Payer: Self-pay

## 2021-02-23 NOTE — Telephone Encounter (Signed)
Called and spoke with the patient and informed him that his Ozempic is available for pickup.  Tangy Drozdowski,cma

## 2021-02-24 NOTE — Telephone Encounter (Signed)
Patient picked up PAP supply. 5 pens of Ozempic 2 mg/1.5 mL

## 2021-02-28 ENCOUNTER — Telehealth (INDEPENDENT_AMBULATORY_CARE_PROVIDER_SITE_OTHER): Payer: PPO | Admitting: Family

## 2021-02-28 ENCOUNTER — Encounter: Payer: Self-pay | Admitting: Family

## 2021-02-28 VITALS — Ht 70.98 in | Wt 165.0 lb

## 2021-02-28 DIAGNOSIS — R051 Acute cough: Secondary | ICD-10-CM | POA: Diagnosis not present

## 2021-02-28 DIAGNOSIS — J209 Acute bronchitis, unspecified: Secondary | ICD-10-CM | POA: Diagnosis not present

## 2021-02-28 DIAGNOSIS — J019 Acute sinusitis, unspecified: Secondary | ICD-10-CM

## 2021-02-28 DIAGNOSIS — B9689 Other specified bacterial agents as the cause of diseases classified elsewhere: Secondary | ICD-10-CM | POA: Diagnosis not present

## 2021-02-28 MED ORDER — DOXYCYCLINE HYCLATE 100 MG PO TABS: 100.0000 mg | ORAL_TABLET | Freq: Two times a day (BID) | ORAL | 0 refills | Status: DC

## 2021-02-28 MED ORDER — PROMETHAZINE-DM 6.25-15 MG/5ML PO SYRP: 5.0000 mL | ORAL_SOLUTION | Freq: Four times a day (QID) | ORAL | 0 refills | Status: DC | PRN

## 2021-02-28 NOTE — Progress Notes (Signed)
Virtual Visit via Video   I connected with patient on 02/28/21 at  3:15 PM EDT by a video enabled telemedicine application and verified that I am speaking with the correct person using two identifiers.  Location patient: Home Location provider: [employer], Office Persons participating in the virtual visit: James Hood, James Hood  I discussed the limitations of evaluation and management by telemedicine and the availability of in person appointments. The patient expressed understanding and agreed to proceed.  Subjective:   HPI:   80 year old presents via video visit with c/o cough, congestion, sinus pressure and pain x 2 weeks and worsening. Feels like the cough is deep and coming from the chest. Would like a cough medication to help. Has been taking OTC meds w/o relief.   ROS:   See pertinent positives and negatives per HPI.  Patient Active Problem List   Diagnosis Date Noted   Primary osteoarthritis of left shoulder 01/25/2021   Rhinorrhea 01/11/2021   Skin lesions 01/11/2021   Chronic, continuous use of opioids 11/15/2020   Therapeutic opioid-induced constipation (OIC) 11/15/2020   Fatigue 10/11/2020   OAB (overactive bladder) 08/08/2020   AKI (acute kidney injury) ([place]) 08/08/2020   Leg cramps 08/08/2020   Prolonged Q-T interval on ECG 06/08/2020   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   Right leg pain 02/05/2020   Impingement of left ankle joint    Sleeping difficulty 08/24/2019   Hiccups 08/24/2019   Chronic pain of left ankle 02/27/2019   Vitamin D deficiency 02/18/2019   Chronic left shoulder pain 02/02/2019   Neuropathy 09/24/2018   Lumbosacral radiculopathy 01/14/2018   Primary osteoarthritis of left ankle 01/14/2018   Facet arthritis of lumbar region 01/14/2018   Chronic pain syndrome 08/26/2017   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   Thyroid nodule 02/27/2017   Fatty liver 02/27/2017   Purpura ([place]) 01/29/2017   Right hip pain 08/16/2016   Lumbar  herniated disc 02/03/2016   Hypertriglyceridemia 12/09/2015   Anemia 12/09/2015   Falls 09/16/2015   Barrett's esophagus 09/16/2015   Chronic lumbar pain 09/16/2015   Benign prostatic hyperplasia 09/16/2015   Trochanteric bursitis of left hip 09/09/2015   Headache 09/09/2015   Difficulty in walking 06/01/2015   Amnesia 06/01/2015   HLD (hyperlipidemia) 02/23/2015   DJD (degenerative joint disease) of knee 11/15/2014   Hypertension    Tremor, essential    Primary localized osteoarthritis of right knee    Anxiety and depression    Absence of sensation 11/18/2013   Tobacco abuse 11/18/2013   Chronic diarrhea 11/12/2013   Benign neoplasm of colon 08/25/2013   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   Type 2 diabetes mellitus ([place]) 10/06/2012    Social History   Tobacco Use   Smoking status: Every Day    Packs/day: 0.50    Years: 60.00    Pack years: 30.00    Types: Cigarettes   Smokeless tobacco: Never   Tobacco comments:    Last one at 0400  Substance Use Topics   Alcohol use: Not Currently    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 REMOVE OLD SENSOR NEFORE APPLYING NEW SENSOR, Disp: 2 each, Rfl: 5   dapagliflozin propanediol (FARXIGA) 10 MG TABS tablet, Complete supply of Jardiance and then start Farxiga 10 mg daily, Disp: 90 tablet, Rfl: 3   divalproex (DEPAKOTE ER) 500 MG 24 hr tablet, Take 1 tablet by mouth 2 (two) times daily., Disp: , Rfl:    donepezil (ARICEPT) 10 MG tablet, Take 10 mg by mouth at bedtime., Disp: , Rfl:     doxycycline (VIBRA-TABS) 100 MG tablet, Take 1 tablet (100 mg total) by mouth 2 (two) times daily., Disp: 20 tablet, Rfl: 0   fenofibrate (TRICOR) 145 MG tablet, TAKE ONE TABLET BY MOUTH DAILY, Disp: 90 tablet, Rfl: 1   ferrous sulfate 325 (65 FE) MG EC tablet, Take 325 mg by mouth daily., Disp: , Rfl:    gabapentin (NEURONTIN) 600 MG tablet, TAKE TWO TABLETS BY MOUTH THREE TIMES A DAY, Disp: 180 tablet, Rfl: 1   Hyoscyamine Sulfate SL 0.125 MG SUBL, Place 1 tablet under the tongue daily as needed., Disp: , Rfl:    ipratropium (ATROVENT) 0.03 % nasal spray, Place 2 sprays into both nostrils every 12 (twelve) hours., Disp: 30 mL, Rfl: 12   lisinopril-hydrochlorothiazide (ZESTORETIC) 20-25 MG tablet, Take 1 tablet by mouth daily., Disp: 90 tablet, Rfl: 1   memantine (NAMENDA) 10 MG tablet, Take 1 tablet by mouth daily., Disp: , Rfl:    metoprolol succinate (TOPROL-XL) 25 MG 24 hr tablet, Take 25 mg by mouth daily., Disp: , Rfl:    Multiple Vitamins-Minerals (CENTRUM SILVER PO), Take 1 tablet by mouth daily., Disp: , Rfl:    omega-3 acid ethyl esters (LOVAZA) 1 g capsule, Take 2 capsules (2 g total) by mouth 2 (two) times daily., Disp: 360 capsule, Rfl: 3   omeprazole (PRILOSEC) 20 MG capsule, Take 20 mg by mouth daily., Disp: , Rfl:    primidone (MYSOLINE) 50 MG tablet, Take 150-250 mg by mouth 2 (two) times daily. Take 250 mg by mouth in the morning & take 150 mg at night., Disp: , Rfl:    promethazine-dextromethorphan (PROMETHAZINE-DM) 6.25-15 MG/5ML syrup, Take 5 mLs by mouth 4 (four) times daily as needed for cough., Disp: 118 mL, Rfl: 0   QUEtiapine (SEROQUEL) 100 MG tablet, Take 150 mg by mouth at bedtime., Disp: , Rfl:    Semaglutide,0.25 or 0.5MG /DOS, (OZEMPIC, 0.25 OR 0.5 MG/DOSE,) 2 MG/1.5ML SOPN, Inject 0.5 mg into the skin once a week., Disp: 1.5 mL, Rfl: 3   simvastatin (ZOCOR) 40 MG tablet, Take 40 mg by mouth daily., Disp: , Rfl:    tamsulosin (FLOMAX) 0.4 MG CAPS capsule, TAKE ONE  CAPSULE BY MOUTH DAILY AFTER DINNER, Disp: 90 capsule, Rfl: 0   traZODone (DESYREL) 100 MG tablet, 150-200 mg at night as needed for sleep (Patient taking differently: 200 mg. 150-200 mg at night as needed for sleep), Disp: 180 tablet, Rfl: 0   varenicline (CHANTIX) 1 MG tablet, Take 1/2 tablet by mouth daily for 3 days, then 1/2 tablet by mouth twice daily for 4 days, then 1 tablet twice daily thereafter. Take with food., Disp: 60 tablet, Rfl: 2   vitamin C (ASCORBIC ACID) 500 MG tablet, Take 500 mg by mouth daily., Disp: , Rfl:    diphenoxylate-atropine (LOMOTIL) 2.5-0.025 MG tablet, Take 1 tablet by mouth in the morning and at bedtime. (Patient not taking: No  sig reported), Disp: , Rfl:   No Active Allergies  Objective:   Ht 5' 10.98" (1.803 m)   Wt 165 lb (74.8 kg)   BMI 23.02 kg/m   Patient is well-developed, well-nourished in no acute distress.  Resting comfortably at home.  Head is normocephalic, atraumatic.  No labored breathing.  Speech is clear and coherent with logical content.  Patient is alert and oriented at baseline.    Assessment and Plan:   James Hood was seen today for cough.  Diagnoses and all orders for this visit:  Acute bacterial sinusitis  Acute cough  Acute bronchitis, unspecified organism  Other orders -     doxycycline (VIBRA-TABS) 100 MG tablet; Take 1 tablet (100 mg total) by mouth 2 (two) times daily. -     promethazine-dextromethorphan (PROMETHAZINE-DM) 6.25-15 MG/5ML syrup; Take 5 mLs by mouth 4 (four) times daily as needed for cough.   Call the office if symptoms worsen or persist. Recheck as scheduled and as needed.   Kennyth Arnold, FNP 02/28/2021

## 2021-03-01 ENCOUNTER — Ambulatory Visit: Payer: PPO | Admitting: Pharmacist

## 2021-03-01 DIAGNOSIS — F32A Depression, unspecified: Secondary | ICD-10-CM

## 2021-03-01 DIAGNOSIS — F419 Anxiety disorder, unspecified: Secondary | ICD-10-CM

## 2021-03-01 DIAGNOSIS — I10 Essential (primary) hypertension: Secondary | ICD-10-CM

## 2021-03-01 DIAGNOSIS — E1142 Type 2 diabetes mellitus with diabetic polyneuropathy: Secondary | ICD-10-CM

## 2021-03-01 DIAGNOSIS — Z720 Tobacco use: Secondary | ICD-10-CM

## 2021-03-01 NOTE — Chronic Care Management (AMB) (Signed)
Chronic Care Management Pharmacy Note  03/01/2021 Name:  James Hood MRN:  [id number] DOB:  [birth year]   Subjective: James Hood is an 80 y.o. year old male who is a primary patient of Caryl Bis, Angela Adam, MD.  The CCM team was consulted for assistance with disease management and care coordination needs.    Engaged with patient by telephone for  medication access  in response to provider referral for pharmacy case management and/or care coordination services.   Consent to Services:  The patient was given information about Chronic Care Management services, agreed to services, and gave verbal consent prior to initiation of services.  Please see initial visit note for detailed documentation.   Patient Care Team: Leone Haven, MD as PCP - General (Family Medicine) De Hollingshead, RPH-CPP as Pharmacist (Pharmacist)  Objective:  Lab Results  Component Value Date   CREATININE 1.11 10/11/2020   CREATININE 1.47 09/08/2020   CREATININE 1.21 07/20/2020    Lab Results  Component Value Date   HGBA1C 6.8 (A) 01/11/2021   Last diabetic Eye exam:  Lab Results  Component Value Date/Time   HMDIABEYEEXA No Retinopathy 01/27/2020 03:37 PM    Last diabetic Foot exam: No results found for: HMDIABFOOTEX      Component Value Date/Time   CHOL 99 07/05/2020 1209   TRIG 226.0 (H) 07/05/2020 1209   HDL 43.20 07/05/2020 1209   CHOLHDL 2 07/05/2020 1209   VLDL 45.2 (H) 07/05/2020 1209   LDLCALC 57 09/25/2017 0924   LDLDIRECT 31.0 07/05/2020 1209    Hepatic Function Latest Ref Rng & Units 10/11/2020 06/08/2020 05/31/2020  Total Protein 6.0 - 8.3 g/dL 6.2 6.9 5.8(L)  Albumin 3.5 - 5.2 g/dL 4.1 3.3(L) 2.8(L)  AST 0 - 37 U/L 15 29 19   ALT 0 - 53 U/L 11 19 14   Alk Phosphatase 39 - 117 U/L 41 58 32(L)  Total Bilirubin 0.2 - 1.2 mg/dL 0.3 0.8 0.7  Bilirubin, Direct 0.0 - 0.3 mg/dL - - -    Lab Results  Component Value Date/Time   TSH 0.97 10/11/2020 10:08 AM   TSH  0.346 (L) 05/31/2020 12:34 AM   TSH 1.36 07/20/2019 09:02 AM    CBC Latest Ref Rng & Units 10/11/2020 06/08/2020 06/01/2020  WBC 4.0 - 10.5 K/uL 5.8 6.5 6.7  Hemoglobin 13.0 - 17.0 g/dL 12.6(L) 11.8(L) 10.2(L)  Hematocrit 39.0 - 52.0 % 37.5(L) 35.7(L) 31.0(L)  Platelets 150.0 - 400.0 K/uL 249.0 375 203    Lab Results  Component Value Date/Time   VD25OH 24.57 (L) 10/11/2020 10:08 AM   VD25OH 42.83 04/07/2019 08:54 AM    Social History   Tobacco Use  Smoking Status Every Day   Packs/day: 0.50   Years: 60.00   Pack years: 30.00   Types: Cigarettes  Smokeless Tobacco Never  Tobacco Comments   Last one at 0400   BP Readings from Last 3 Encounters:  02/08/21 124/78  02/01/21 123/80  01/30/21 96/60   Pulse Readings from Last 3 Encounters:  02/01/21 82  01/30/21 87  01/11/21 64   Wt Readings from Last 3 Encounters:  02/28/21 165 lb (74.8 kg)  02/01/21 165 lb (74.8 kg)  01/30/21 165 lb 9.6 oz (75.1 kg)    Assessment: Review of patient past medical history, allergies, medications, health status, including review of consultants reports, laboratory and other test data, was performed as part of comprehensive evaluation and provision of chronic care management services.   SDOH:  (Social  Determinants of Health) assessments and interventions performed:    CCM Care Plan  No Active Allergies  Medications Reviewed Today     Reviewed by De Hollingshead, RPH-CPP (Pharmacist) on 02/16/21 at 1344  Med List Status: <None>   Medication Order Taking? Sig Documenting Provider Last Dose Status Informant  aspirin EC 81 MG tablet [id number] Yes Take 81 mg by mouth daily. [provider] Taking Active            Med Note Vanessa Groom, SUSAN   Tue Apr 15, 2018  8:47 AM)    budesonide (ENTOCORT EC) 3 MG 24 hr capsule [id number] Yes Take 6 mg by mouth daily. [provider] Taking Active   buPROPion (WELLBUTRIN XL) 300 MG 24 hr tablet [id number] Yes Take 1 tablet (300 mg  total) by mouth daily. Norman Clay, MD Taking Active   Continuous Blood Gluc Sensor (FREESTYLE LIBRE 14 DAY SENSOR) MISC [id number] Yes APPLY ONE SENSOR EVERY 14 DAYS TO MONITOR BLOOD GLUCOSE LEVELS. [place] Leone Haven, MD Taking Active   dapagliflozin propanediol (FARXIGA) 10 MG TABS tablet [id number] Yes Complete supply of Jardiance and then start Farxiga 10 mg daily Leone Haven, MD Taking Active   diphenoxylate-atropine (LOMOTIL) 2.5-0.025 MG tablet [id number] No Take 1 tablet by mouth in the morning and at bedtime.  Patient not taking: Reported on 02/16/2021   [provider] Not Taking Active   divalproex (DEPAKOTE ER) 500 MG 24 hr tablet [id number] Yes Take 1 tablet by mouth 2 (two) times daily. [provider] Taking Active   donepezil (ARICEPT) 10 MG tablet [id number] Yes Take 10 mg by mouth at bedtime. [provider] Taking Active Self  fenofibrate (TRICOR) 145 MG tablet [id number] Yes TAKE ONE TABLET BY MOUTH DAILY Leone Haven, MD Taking Active   gabapentin (NEURONTIN) 600 MG tablet [id number] Yes TAKE TWO TABLETS BY MOUTH THREE TIMES A DAY Leone Haven, MD Taking Active   HYDROcodone-acetaminophen [facility]) 10-325 MG tablet [id number] Yes Take 1 tablet by mouth every 4 (four) hours as needed for severe pain. Must last 30 days. [name], MD Taking Active   Hyoscyamine Sulfate SL 0.125 MG SUBL [id number] Yes Place 1 tablet under the tongue daily as needed. [provider] Taking Active   ipratropium (ATROVENT) 0.03 % nasal spray [id number] Yes Place 2 sprays into both nostrils every 12 (twelve) hours. Leone Haven, MD Taking Active   lisinopril-hydrochlorothiazide (ZESTORETIC) 20-25 MG tablet [id number] Yes Take 1 tablet by mouth daily. Leone Haven, MD Taking Active   memantine [facility]) 10 MG tablet [id number] Yes Take 1 tablet by mouth daily. [provider] Taking Active    metoprolol succinate (TOPROL-XL) 25 MG 24 hr tablet [id number] Yes Take 25 mg by mouth daily. [provider] Taking Active   Multiple Vitamins-Minerals (CENTRUM SILVER PO) [id number] Yes Take 1 tablet by mouth daily. [provider] Taking Active   omega-3 acid ethyl esters (LOVAZA) 1 g capsule [id number] Yes Take 2 capsules (2 g total) by mouth 2 (two) times daily. Leone Haven, MD Taking Active   omeprazole (PRILOSEC) 20 MG capsule [id number] Yes Take 20 mg by mouth daily. [provider] Taking Active Self           Med Note Josiah Lobo, KIMBERLY   Thu Aug 09, 2016 12:05 PM)    primidone (MYSOLINE) 50 MG tablet [id number] Yes Take 150-250 mg by mouth 2 (two) times daily. Take  250 mg by mouth in the morning & take 150 mg at night. [provider] Taking Active Self  QUEtiapine (SEROQUEL) 100 MG tablet [id number] Yes Take 150 mg by mouth at bedtime. [provider] Taking Active Pharmacy Records  Semaglutide,0.25 or 0.5MG/DOS, (OZEMPIC, 0.25 OR 0.5 MG/DOSE,) 2 MG/1.5ML SOPN [id number] Yes Inject 0.5 mg into the skin once a week. Leone Haven, MD Taking Active Pharmacy Records  simvastatin [facility]) 40 MG tablet [id number] Yes Take 40 mg by mouth daily. [provider] Taking Active   tamsulosin (FLOMAX) 0.4 MG CAPS capsule [id number] Yes TAKE ONE CAPSULE BY MOUTH DAILY AFTER Burnett Harry, MD Taking Active   traZODone (DESYREL) 100 MG tablet [id number] Yes 150-200 mg at night as needed for sleep  Patient taking differently: 200 mg. 150-200 mg at night as needed for sleep   Norman Clay, MD Taking Active   Med List Note [name], RN 11/15/20 2440): MR 02/21/2021 UDS 11/15/2020            Patient Active Problem List   Diagnosis Date Noted   Primary osteoarthritis of left shoulder 01/25/2021   Rhinorrhea 01/11/2021   Skin lesions 01/11/2021   Chronic, continuous use of opioids 11/15/2020   Therapeutic opioid-induced  constipation (OIC) 11/15/2020   Fatigue 10/11/2020   OAB (overactive bladder) 08/08/2020   AKI (acute kidney injury) ([place]) 08/08/2020   Leg cramps 08/08/2020   Prolonged Q-T interval on ECG 06/08/2020   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   Right leg pain 02/05/2020   Impingement of left ankle joint    Sleeping difficulty 08/24/2019   Hiccups 08/24/2019   Chronic pain of left ankle 02/27/2019   Vitamin D deficiency 02/18/2019   Chronic left shoulder pain 02/02/2019   Neuropathy 09/24/2018   Lumbosacral radiculopathy 01/14/2018   Primary osteoarthritis of left ankle 01/14/2018   Facet arthritis of lumbar region 01/14/2018   Chronic pain syndrome 08/26/2017   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   Thyroid nodule 02/27/2017   Fatty liver 02/27/2017   Purpura ([place]) 01/29/2017   Right hip pain 08/16/2016   Lumbar herniated disc 02/03/2016   Hypertriglyceridemia 12/09/2015   Anemia 12/09/2015   Falls 09/16/2015   Barrett's esophagus 09/16/2015   Chronic lumbar pain 09/16/2015   Benign prostatic hyperplasia 09/16/2015   Trochanteric bursitis of left hip 09/09/2015   Headache 09/09/2015   Difficulty in walking 06/01/2015   Amnesia 06/01/2015   HLD (hyperlipidemia) 02/23/2015   DJD (degenerative joint disease) of knee 11/15/2014   Hypertension    Tremor, essential    Primary localized osteoarthritis of right knee    Anxiety and depression    Absence of sensation 11/18/2013   Tobacco abuse 11/18/2013   Chronic diarrhea 11/12/2013   Benign neoplasm of colon 08/25/2013   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   Type 2 diabetes mellitus ([place]) 10/06/2012    Immunization History  Administered Date(s) Administered   Fluad Quad(high Dose 65+) 02/05/2020   Hepatitis A, Adult 12/02/2018   Influenza, High Dose Seasonal PF 01/29/2017, 03/19/2018, 01/10/2019   Influenza,inj,Quad PF,6+ Mos 03/13/2016   Influenza-Unspecified 01/21/2015   PFIZER(Purple Top)SARS-COV-2 Vaccination 06/01/2019,  06/22/2019, 03/09/2020, 08/02/2020   Pneumococcal Conjugate-13 02/07/2015   Pneumococcal Polysaccharide-23 01/18/2018   Tdap 09/16/2015, 01/18/2018   Zoster Recombinat (Shingrix) 01/03/2018, 05/09/2018   Zoster, Live 09/16/2015    Conditions to be addressed/monitored: HTN, HLD, DMII, and Depression  Care Plan : Medication Management  Updates made by De Hollingshead, RPH-CPP since 03/01/2021 12:00 AM  Problem: Diabetes, Depression      Long-Range Goal: Disease Progression Prevention   Start Date: 02/16/2020  Recent Progress: On track  Priority: Medium  Note:   Current Barriers:  Unable to independently afford treatment regimen  Pharmacist Clinical Goal(s):  Over the next 30 days, patient will verbalize ability to afford treatment regimen through collaboration with PharmD and provider.  Over the next 90 days, patient will maintain glycemic control as evidenced by A1c through collaboration with PharmD and provider.  Interventions: 1:1 collaboration with Leone Haven, MD regarding development and update of comprehensive plan of care as evidenced by provider attestation and co-signature Inter-disciplinary care team collaboration (see longitudinal plan of care) Comprehensive medication review performed; medication list updated in electronic medical record  Diabetes: Controlled; current treatment: Farxiga 10 mg daily; Ozempic 0.5 mg weekly (max tolerated dose d/t GI upset/weight loss at 1 mg weekly)  Approved for Ozempic and [place] assistance.  Hx metformin ER - significant diarrhea that resolved upon discontinuation Current glucose readings: using Libre 14 day CGM, reports readings remain at target ~ 95% Previously recommended to continue current regimen at this time. Will plan to reapply for assistance for 2023 later this year.   Hypertension: Controlled per last clinic reading; current treatment: lisinopril/HCTZ 20/25 mg daily, metoprolol succinate 25 mg  daily Current home readings: has not checked at home recently, but have been at goal at recent provider visits. Denies any lightheadness, dizziness.   Previously recommended to continue current regimen at this time  Hyperlipidemia: Controlled; current treatment: rosuvastatin 40 mg daily, fenofibrate 145 mg daily, OTC omega 3 fatty acids 2 g BID Generic Lovaza and Vascepa too expensive. [place] funding closed.  Previously recommended to continue current regimen at this time  Depression/insomnia: Improved, sleep improved per patient report. Current regimen: bupropion XL 300 mg daily, trazodone 200 mg QPM. Follows w/ Dr. Modesta Messing and LCSW Psychiatry previously discussed reducing dose of quetiapine to reduce risk of EPS. Patient declined.  Previously recommended to continue current collaboration with psychiatry.   Chronic Pain : Improved per patient report; hydrocodone/APAP 10/325 mg up to TID, gabapentin 1200 mg TID, reports he sometimes uses additional doses. Follows w/ Dr. Holley Raring Advised to limit gabapentin to prescribed amounts to avoid oversedation Previously recommended to continue current regimen at this time as prescribed  Neurologic Conditions (pseudo dementia, tremor, sleep disorder) Well managed per patient report; current regimen: memantine 10 mg BID, donepezil 10 mg daily; primidone 250 mg QAM, 150 mg QPM; divalproex 500 mg BID, quetiapine 150 mg QPM; Follows w/ Dr. Melrose Nakayama.  Previously recommended to continue current regimen at this time along with collaboration w/ Neurology  Chronic Diarrhea/Colitis, Barrett's: Controlled per patient report; current regimen: budesonide 6 mg daily, Lomotil 1 tab QAM, hyoscyamine PRN added at last visit; follows w/ [facility] Barrett's: omeprazole 20 mg daily Previously recommended to continue current regimen at this time  OAB/BPH: Uncontrolled, notes some continued incomplete emptying and increased urinary frequency; current  treatment regimen: tamsulosin 0.4 mg daily; follows w/ Dr. Diamantina Providence Previously recommended to continue current regimen at this time and continue to communicate with urology if further treatment is needed.   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 Currently unsuccessful on bupropion. Completed [place] Exception Request application for varenicline with PCP on patient's behalf today. Will follow for results.    Patient Goals/Self-Care Activities Over the next 90  days, patient will:  - take medications as prescribed check glucose at least 3 times daily using CGM, document, and provide at future appointments collaborate with provider on medication access solutions engage in dietary modifications by reducing carbohydrate portion sizes Focus on tobacco cessation  Follow Up Plan: Telephone follow up appointment with care management team member scheduled for: 4 weeks as previously scheduled       Patient's preferred pharmacy is:  [facility] [name], [place] [address] Phone: [phone] Fax: [phone]  [facility] [name], [address] [place] [address] Phone: [phone] Fax: [phone]  [place] [facility] [address] [place] [address] Phone: [phone] Fax: [phone]  [place], [place] [place] [address] Phone: [phone] Fax: [phone]    Follow Up:  Patient agrees to Care Plan and Follow-up.  Plan: Telephone follow up appointment with care management team member scheduled for:  4 weeks as previously scheduled  Catie Darnelle Maffucci, PharmD, [place], [place] Clinical Pharmacist [employer] at [employer] [phone]

## 2021-03-01 NOTE — Patient Instructions (Signed)
Visit Information  PATIENT GOALS:  Goals Addressed               This Visit's Progress     Patient Stated     Medication Monitoring (pt-stated)        Patient Goals/Self-Care Activities Over the next 90 days, patient will:  - take medications as prescribed check glucose at least 3 times daily using CGM, document, and provide at future appointments collaborate with provider on medication access solutions engage in dietary modifications by reducing carbohydrate portion sizes Focus on tobacco cessation          Patient verbalizes understanding of instructions provided today and agrees to view in [place].   Plan: Telephone follow up appointment with care management team member scheduled for:  4 weeks as previously scheduled  Catie Darnelle Maffucci, PharmD, [place], [place] Clinical Pharmacist [employer] at [employer] [phone]

## 2021-03-02 ENCOUNTER — Ambulatory Visit: Payer: PPO | Admitting: Pharmacist

## 2021-03-02 DIAGNOSIS — Z720 Tobacco use: Secondary | ICD-10-CM

## 2021-03-02 DIAGNOSIS — I10 Essential (primary) hypertension: Secondary | ICD-10-CM

## 2021-03-02 DIAGNOSIS — E1142 Type 2 diabetes mellitus with diabetic polyneuropathy: Secondary | ICD-10-CM

## 2021-03-02 DIAGNOSIS — F419 Anxiety disorder, unspecified: Secondary | ICD-10-CM

## 2021-03-02 DIAGNOSIS — F32A Depression, unspecified: Secondary | ICD-10-CM

## 2021-03-02 MED ORDER — VARENICLINE TARTRATE 1 MG PO TABS: ORAL_TABLET | ORAL | 5 refills | Status: DC

## 2021-03-02 NOTE — Patient Instructions (Signed)
Visit Information  PATIENT GOALS:  Goals Addressed               This Visit's Progress     Patient Stated     Medication Monitoring (pt-stated)        Patient Goals/Self-Care Activities Over the next 90 days, patient will:  - take medications as prescribed check glucose at least 3 times daily using CGM, document, and provide at future appointments collaborate with provider on medication access solutions engage in dietary modifications by reducing carbohydrate portion sizes Focus on tobacco cessation        Patient verbalizes understanding of instructions provided today and agrees to view in [place].   Plan: Telephone follow up appointment with care management team member scheduled for:  4 weeks  Catie Darnelle Maffucci, PharmD, [place], [place] Clinical Pharmacist [employer] at [employer] [phone]

## 2021-03-02 NOTE — Chronic Care Management (AMB) (Signed)
Chronic Care Management Pharmacy Note  03/02/2021 Name:  James Hood MRN:  [id number] DOB:  20-Oct-1940   Subjective: James Hood is an 80 y.o. year old male who is a primary patient of Caryl Bis, Angela Adam, MD.  The CCM team was consulted for assistance with disease management and care coordination needs.    Engaged with patient by telephone for follow up visit in response to provider referral for pharmacy case management and/or care coordination services.   Consent to Services:  The patient was given information about Chronic Care Management services, agreed to services, and gave verbal consent prior to initiation of services.  Please see initial visit note for detailed documentation.   Patient Care Team: Leone Haven, MD as PCP - General (Family Medicine) De Hollingshead, RPH-CPP as Pharmacist (Pharmacist)  Objective:  Lab Results  Component Value Date   CREATININE 1.11 10/11/2020   CREATININE 1.47 09/08/2020   CREATININE 1.21 07/20/2020    Lab Results  Component Value Date   HGBA1C 6.8 (A) 01/11/2021   Last diabetic Eye exam:  Lab Results  Component Value Date/Time   HMDIABEYEEXA No Retinopathy 01/27/2020 03:37 PM    Last diabetic Foot exam: No results found for: HMDIABFOOTEX      Component Value Date/Time   CHOL 99 07/05/2020 1209   TRIG 226.0 (H) 07/05/2020 1209   HDL 43.20 07/05/2020 1209   CHOLHDL 2 07/05/2020 1209   VLDL 45.2 (H) 07/05/2020 1209   LDLCALC 57 09/25/2017 0924   LDLDIRECT 31.0 07/05/2020 1209    Hepatic Function Latest Ref Rng & Units 10/11/2020 06/08/2020 05/31/2020  Total Protein 6.0 - 8.3 g/dL 6.2 6.9 5.8(L)  Albumin 3.5 - 5.2 g/dL 4.1 3.3(L) 2.8(L)  AST 0 - 37 U/L 15 29 19   ALT 0 - 53 U/L 11 19 14   Alk Phosphatase 39 - 117 U/L 41 58 32(L)  Total Bilirubin 0.2 - 1.2 mg/dL 0.3 0.8 0.7  Bilirubin, Direct 0.0 - 0.3 mg/dL - - -    Lab Results  Component Value Date/Time   TSH 0.97 10/11/2020 10:08 AM   TSH 0.346  (L) 05/31/2020 12:34 AM   TSH 1.36 07/20/2019 09:02 AM    CBC Latest Ref Rng & Units 10/11/2020 06/08/2020 06/01/2020  WBC 4.0 - 10.5 K/uL 5.8 6.5 6.7  Hemoglobin 13.0 - 17.0 g/dL 12.6(L) 11.8(L) 10.2(L)  Hematocrit 39.0 - 52.0 % 37.5(L) 35.7(L) 31.0(L)  Platelets 150.0 - 400.0 K/uL 249.0 375 203    Lab Results  Component Value Date/Time   VD25OH 24.57 (L) 10/11/2020 10:08 AM   VD25OH 42.83 04/07/2019 08:54 AM    Social History   Tobacco Use  Smoking Status Every Day   Packs/day: 0.50   Years: 60.00   Pack years: 30.00   Types: Cigarettes  Smokeless Tobacco Never  Tobacco Comments   Last one at 0400   BP Readings from Last 3 Encounters:  02/08/21 124/78  02/01/21 123/80  01/30/21 96/60   Pulse Readings from Last 3 Encounters:  02/01/21 82  01/30/21 87  01/11/21 64   Wt Readings from Last 3 Encounters:  02/28/21 165 lb (74.8 kg)  02/01/21 165 lb (74.8 kg)  01/30/21 165 lb 9.6 oz (75.1 kg)    Assessment: Review of patient past medical history, allergies, medications, health status, including review of consultants reports, laboratory and other test data, was performed as part of comprehensive evaluation and provision of chronic care management services.   SDOH:  (Social Determinants  of Health) assessments and interventions performed:  SDOH Interventions    Flowsheet Row Most Recent Value  SDOH Interventions   Financial Strain Interventions Other (Comment)  [manufacturer assistance]       CCM Care Plan  No Active Allergies  Medications Reviewed Today     Reviewed by De Hollingshead, RPH-CPP (Pharmacist) on 02/16/21 at 1344  Med List Status: <None>   Medication Order Taking? Sig Documenting Provider Last Dose Status Informant  aspirin EC 81 MG tablet [id number] Yes Take 81 mg by mouth daily. [provider] Taking Active            Med Note Vanessa [place], SUSAN   Tue Apr 15, 2018  8:47 AM)    budesonide (ENTOCORT EC) 3 MG 24 hr capsule [id number] Yes  Take 6 mg by mouth daily. [provider] Taking Active   buPROPion (WELLBUTRIN XL) 300 MG 24 hr tablet [id number] Yes Take 1 tablet (300 mg total) by mouth daily. Norman Clay, MD Taking Active   Continuous Blood Gluc Sensor (FREESTYLE LIBRE 14 DAY SENSOR) MISC [id number] Yes APPLY ONE SENSOR EVERY 14 DAYS TO MONITOR BLOOD GLUCOSE LEVELS. [place] Leone Haven, MD Taking Active   dapagliflozin propanediol (FARXIGA) 10 MG TABS tablet [id number] Yes Complete supply of Jardiance and then start Farxiga 10 mg daily Leone Haven, MD Taking Active   diphenoxylate-atropine (LOMOTIL) 2.5-0.025 MG tablet [id number] No Take 1 tablet by mouth in the morning and at bedtime.  Patient not taking: Reported on 02/16/2021   [provider] Not Taking Active   divalproex (DEPAKOTE ER) 500 MG 24 hr tablet [id number] Yes Take 1 tablet by mouth 2 (two) times daily. [provider] Taking Active   donepezil (ARICEPT) 10 MG tablet [id number] Yes Take 10 mg by mouth at bedtime. [provider] Taking Active Self  fenofibrate (TRICOR) 145 MG tablet [id number] Yes TAKE ONE TABLET BY MOUTH DAILY Leone Haven, MD Taking Active   gabapentin (NEURONTIN) 600 MG tablet [id number] Yes TAKE TWO TABLETS BY MOUTH THREE TIMES A DAY Leone Haven, MD Taking Active   HYDROcodone-acetaminophen [facility]) 10-325 MG tablet [id number] Yes Take 1 tablet by mouth every 4 (four) hours as needed for severe pain. Must last 30 days. [name], MD Taking Active   Hyoscyamine Sulfate SL 0.125 MG SUBL [id number] Yes Place 1 tablet under the tongue daily as needed. [provider] Taking Active   ipratropium (ATROVENT) 0.03 % nasal spray [id number] Yes Place 2 sprays into both nostrils every 12 (twelve) hours. Leone Haven, MD Taking Active   lisinopril-hydrochlorothiazide (ZESTORETIC) 20-25 MG tablet [id number] Yes Take 1 tablet by mouth daily. Leone Haven, MD Taking Active   memantine [facility]) 10 MG tablet [id number] Yes Take 1 tablet by mouth daily. [provider] Taking Active   metoprolol succinate (TOPROL-XL) 25 MG 24 hr tablet [id number] Yes Take 25 mg by mouth daily. [provider] Taking Active   Multiple Vitamins-Minerals (CENTRUM SILVER PO) [id number] Yes Take 1 tablet by mouth daily. [provider] Taking Active   omega-3 acid ethyl esters (LOVAZA) 1 g capsule [id number] Yes Take 2 capsules (2 g total) by mouth 2 (two) times daily. Leone Haven, MD Taking Active   omeprazole (PRILOSEC) 20 MG capsule [id number] Yes Take 20 mg by mouth daily. [provider] Taking Active Self           Med Note ([facility], [facility]  Thu Aug 09, 2016 12:05 PM)    primidone (MYSOLINE) 50 MG tablet [id number] Yes Take 150-250 mg by mouth 2 (two) times daily. Take 250 mg by mouth in the morning & take 150 mg at night. [provider] Taking Active Self  QUEtiapine (SEROQUEL) 100 MG tablet [id number] Yes Take 150 mg by mouth at bedtime. [provider] Taking Active Pharmacy Records  Semaglutide,0.25 or 0.5MG/DOS, (OZEMPIC, 0.25 OR 0.5 MG/DOSE,) 2 MG/1.5ML SOPN [id number] Yes Inject 0.5 mg into the skin once a week. Leone Haven, MD Taking Active Pharmacy Records  simvastatin [facility]) 40 MG tablet [id number] Yes Take 40 mg by mouth daily. [provider] Taking Active   tamsulosin (FLOMAX) 0.4 MG CAPS capsule [id number] Yes TAKE ONE CAPSULE BY MOUTH DAILY AFTER Burnett Harry, MD Taking Active   traZODone (DESYREL) 100 MG tablet [id number] Yes 150-200 mg at night as needed for sleep  Patient taking differently: 200 mg. 150-200 mg at night as needed for sleep   Norman Clay, MD Taking Active   Med List Note [name], RN 11/15/20 5498): MR 02/21/2021 UDS 11/15/2020            Patient Active Problem List   Diagnosis Date Noted   Primary osteoarthritis of left  shoulder 01/25/2021   Rhinorrhea 01/11/2021   Skin lesions 01/11/2021   Chronic, continuous use of opioids 11/15/2020   Therapeutic opioid-induced constipation (OIC) 11/15/2020   Fatigue 10/11/2020   OAB (overactive bladder) 08/08/2020   AKI (acute kidney injury) ([place]) 08/08/2020   Leg cramps 08/08/2020   Prolonged Q-T interval on ECG 06/08/2020   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   Right leg pain 02/05/2020   Impingement of left ankle joint    Sleeping difficulty 08/24/2019   Hiccups 08/24/2019   Chronic pain of left ankle 02/27/2019   Vitamin D deficiency 02/18/2019   Chronic left shoulder pain 02/02/2019   Neuropathy 09/24/2018   Lumbosacral radiculopathy 01/14/2018   Primary osteoarthritis of left ankle 01/14/2018   Facet arthritis of lumbar region 01/14/2018   Chronic pain syndrome 08/26/2017   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   Thyroid nodule 02/27/2017   Fatty liver 02/27/2017   Purpura ([place]) 01/29/2017   Right hip pain 08/16/2016   Lumbar herniated disc 02/03/2016   Hypertriglyceridemia 12/09/2015   Anemia 12/09/2015   Falls 09/16/2015   Barrett's esophagus 09/16/2015   Chronic lumbar pain 09/16/2015   Benign prostatic hyperplasia 09/16/2015   Trochanteric bursitis of left hip 09/09/2015   Headache 09/09/2015   Difficulty in walking 06/01/2015   Amnesia 06/01/2015   HLD (hyperlipidemia) 02/23/2015   DJD (degenerative joint disease) of knee 11/15/2014   Hypertension    Tremor, essential    Primary localized osteoarthritis of right knee    Anxiety and depression    Absence of sensation 11/18/2013   Tobacco abuse 11/18/2013   Chronic diarrhea 11/12/2013   Benign neoplasm of colon 08/25/2013   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   Type 2 diabetes mellitus ([place]) 10/06/2012    Immunization History  Administered Date(s) Administered   Fluad Quad(high Dose 65+) 02/05/2020   Hepatitis A, Adult 12/02/2018   Influenza, High Dose Seasonal PF 01/29/2017, 03/19/2018,  01/10/2019   Influenza,inj,Quad PF,6+ Mos 03/13/2016   Influenza-Unspecified 01/21/2015   PFIZER(Purple Top)SARS-COV-2 Vaccination 06/01/2019, 06/22/2019, 03/09/2020, 08/02/2020   Pneumococcal Conjugate-13 02/07/2015   Pneumococcal Polysaccharide-23 01/18/2018   Tdap 09/16/2015, 01/18/2018   Zoster Recombinat (Shingrix) 01/03/2018, 05/09/2018   Zoster, Live 09/16/2015    Conditions to  be addressed/monitored: CAD, DMII, and tobacco abuse  Care Plan : Medication Management  Updates made by De Hollingshead, RPH-CPP since 03/02/2021 12:00 AM     Problem: Diabetes, Depression      Long-Range Goal: Disease Progression Prevention   Start Date: 02/16/2020  Recent Progress: On track  Priority: Medium  Note:   Current Barriers:  Unable to independently afford treatment regimen  Pharmacist Clinical Goal(s):  Over the next 30 days, patient will verbalize ability to afford treatment regimen through collaboration with PharmD and provider.  Over the next 90 days, patient will maintain glycemic control as evidenced by A1c through collaboration with PharmD and provider.  Interventions: 1:1 collaboration with Leone Haven, MD regarding development and update of comprehensive plan of care as evidenced by provider attestation and co-signature Inter-disciplinary care team collaboration (see longitudinal plan of care) Comprehensive medication review performed; medication list updated in electronic medical record  Diabetes: Controlled; current treatment: Farxiga 10 mg daily; Ozempic 0.5 mg weekly (max tolerated dose d/t GI upset/weight loss at 1 mg weekly)  Approved for Ozempic and [place] assistance.  Hx metformin ER - significant diarrhea that resolved upon discontinuation Current glucose readings: using Libre 14 day CGM, reports readings remain at target ~ 95% Previously recommended to continue current regimen at this time. Will plan to reapply for assistance for 2023 later this  year.   Hypertension: Controlled per last clinic reading; current treatment: lisinopril/HCTZ 20/25 mg daily, metoprolol succinate 25 mg daily Current home readings: has not checked at home recently, but have been at goal at recent provider visits. Denies any lightheadness, dizziness.   Previously recommended to continue current regimen at this time  Hyperlipidemia: Controlled; current treatment: rosuvastatin 40 mg daily, fenofibrate 145 mg daily, OTC omega 3 fatty acids 2 g BID Generic Lovaza and Vascepa too expensive. [place] funding closed.  Previously recommended to continue current regimen at this time  Depression/insomnia: Improved, sleep improved per patient report. Current regimen: bupropion XL 300 mg daily, trazodone 200 mg QPM. Follows w/ Dr. Modesta Messing and LCSW Psychiatry previously discussed reducing dose of quetiapine to reduce risk of EPS. Patient declined.  Previously recommended to continue current collaboration with psychiatry.   Chronic Pain : Improved per patient report; hydrocodone/APAP 10/325 mg up to TID, gabapentin 1200 mg TID, reports he sometimes uses additional doses. Follows w/ Dr. Holley Raring Advised to limit gabapentin to prescribed amounts to avoid oversedation Previously recommended to continue current regimen at this time as prescribed  Neurologic Conditions (pseudo dementia, tremor, sleep disorder) Well managed per patient report; current regimen: memantine 10 mg BID, donepezil 10 mg daily; primidone 250 mg QAM, 150 mg QPM; divalproex 500 mg BID, quetiapine 150 mg QPM; Follows w/ Dr. Melrose Nakayama.  Previously recommended to continue current regimen at this time along with collaboration w/ Neurology  Chronic Diarrhea/Colitis, Barrett's: Controlled per patient report; current regimen: budesonide 6 mg daily, Lomotil 1 tab QAM, hyoscyamine PRN added at last visit; follows w/ [facility] Barrett's: omeprazole 20 mg daily Previously recommended to continue  current regimen at this time  OAB/BPH: Uncontrolled, notes some continued incomplete emptying and increased urinary frequency; current treatment regimen: tamsulosin 0.4 mg daily; follows w/ Dr. Diamantina [place] Previously recommended to continue current regimen at this time and continue to communicate with urology if further treatment is needed.   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 Currently unsuccessful  on bupropion. Tier exception request for varenicline was APPROVED. It will now be covered at Tier 1 pricing. Script sent to pharmacy at patient's request. Again reviewed counseling points to take with food, stop medication and notify us if worsening mood. Start varenicline 0.5 mg daily x 3 days, increase to 0.5 mg twice daily for 4 days, then increase to 1 mg twice daily. Advised that varenicline can be combined with nicotine replacement therapy, if he so chooses moving forward. Advised to contemplate a quit date. Patient verbalized understanding.    Patient Goals/Self-Care Activities Over the next 90 days, patient will:  - take medications as prescribed check glucose at least 3 times daily using CGM, document, and provide at future appointments collaborate with provider on medication access solutions engage in dietary modifications by reducing carbohydrate portion sizes Focus on tobacco cessation  Follow Up Plan: Telephone follow up appointment with care management team member scheduled for: 4 weeks as previously scheduled      Medication Assistance:  Ozempic obtained through [employer] medication assistance program.  Enrollment ends 04/19/21  Patient's preferred pharmacy is:  [facility] [name], [place] [address] Phone: [phone] Fax: [facility] #[id number] [name], [address] [place] [address] Phone: [phone] Fax: [phone]  [facility] [address], [place] [address] [place] [address] Phone: [phone] Fax: [phone]  [facility], [address] [place] [address] Phone: [phone] Fax: [phone]    Follow Up:  Patient agrees to Care Plan and Follow-up.  Plan: Telephone follow up appointment with care management team member scheduled for:  4 weeks  Catie Darnelle Maffucci, PharmD, [place], [place] Clinical Pharmacist [employer] at [employer] [phone]

## 2021-03-03 ENCOUNTER — Encounter: Payer: Self-pay | Admitting: Family

## 2021-03-03 DIAGNOSIS — D044 Carcinoma in situ of skin of scalp and neck: Secondary | ICD-10-CM | POA: Diagnosis not present

## 2021-03-06 ENCOUNTER — Other Ambulatory Visit: Payer: Self-pay | Admitting: Family Medicine

## 2021-03-06 DIAGNOSIS — R251 Tremor, unspecified: Secondary | ICD-10-CM | POA: Diagnosis not present

## 2021-03-06 DIAGNOSIS — G2581 Restless legs syndrome: Secondary | ICD-10-CM | POA: Diagnosis not present

## 2021-03-06 DIAGNOSIS — R413 Other amnesia: Secondary | ICD-10-CM | POA: Diagnosis not present

## 2021-03-06 DIAGNOSIS — M25572 Pain in left ankle and joints of left foot: Secondary | ICD-10-CM | POA: Diagnosis not present

## 2021-03-06 DIAGNOSIS — R262 Difficulty in walking, not elsewhere classified: Secondary | ICD-10-CM | POA: Diagnosis not present

## 2021-03-06 DIAGNOSIS — G479 Sleep disorder, unspecified: Secondary | ICD-10-CM | POA: Diagnosis not present

## 2021-03-06 DIAGNOSIS — F17200 Nicotine dependence, unspecified, uncomplicated: Secondary | ICD-10-CM | POA: Diagnosis not present

## 2021-03-14 ENCOUNTER — Telehealth: Payer: Self-pay

## 2021-03-14 NOTE — Telephone Encounter (Signed)
A PA was done on the Varenicline 1 mg tablet and it was approved through 05/20/2022.  Deejay Koppelman,cma

## 2021-03-17 ENCOUNTER — Ambulatory Visit: Payer: PPO | Admitting: Pharmacist

## 2021-03-17 DIAGNOSIS — F419 Anxiety disorder, unspecified: Secondary | ICD-10-CM

## 2021-03-17 DIAGNOSIS — E1142 Type 2 diabetes mellitus with diabetic polyneuropathy: Secondary | ICD-10-CM

## 2021-03-17 DIAGNOSIS — I10 Essential (primary) hypertension: Secondary | ICD-10-CM

## 2021-03-17 DIAGNOSIS — Z720 Tobacco use: Secondary | ICD-10-CM

## 2021-03-17 NOTE — Patient Instructions (Signed)
Visit Information  PATIENT GOALS:  Goals Addressed               This Visit's Progress     Patient Stated     Medication Monitoring (pt-stated)        Patient Goals/Self-Care Activities Over the next 90 days, patient will:  - take medications as prescribed check glucose at least 3 times daily using CGM, document, and provide at future appointments collaborate with provider on medication access solutions engage in dietary modifications by reducing carbohydrate portion sizes Focus on tobacco cessation         Patient verbalizes understanding of instructions provided today and agrees to view in [place].   Plan: Telephone follow up appointment with care management team member scheduled for:  40 weeks  Catie Darnelle Maffucci, PharmD, [place], [place] Clinical Pharmacist [employer] at [employer] [phone]

## 2021-03-17 NOTE — Chronic Care Management (AMB) (Signed)
Chronic Care Management Pharmacy Note  03/17/2021 Name:  James Hood MRN:  [id number] DOB:  18-Mar-1941  Subjective: James Hood is an 80 y.o. year old male who is a primary patient of Caryl Bis, Angela Adam, MD.  The CCM team was consulted for assistance with disease management and care coordination needs.    Engaged with patient by telephone for follow up visit for pharmacy case management and/or care coordination services.   Objective:  Medications Reviewed Today     Reviewed by Kennyth Arnold, FNP (Family Nurse Practitioner) on 03/03/21 at 1800  Med List Status: <None>   Medication Order Taking? Sig Documenting Provider Last Dose Status Informant  aspirin EC 81 MG tablet [id number] Yes Take 81 mg by mouth daily. [provider] Taking Active            Med Note Vanessa Indios, SUSAN   Tue Apr 15, 2018  8:47 AM)    budesonide (ENTOCORT EC) 3 MG 24 hr capsule [id number] Yes Take 6 mg by mouth daily. [provider] Taking Active   buPROPion (WELLBUTRIN XL) 300 MG 24 hr tablet [id number] Yes Take 1 tablet (300 mg total) by mouth daily. Norman Clay, MD Taking Active   Continuous Blood Gluc Sensor (FREESTYLE LIBRE 14 DAY SENSOR) MISC [id number] Yes APPLY ONE SENSOR EVERY 14 DAYS TO MONITOR BLOOD GLUCOSE LEVELS. [place] Leone Haven, MD Taking Active   dapagliflozin propanediol (FARXIGA) 10 MG TABS tablet [id number] Yes Complete supply of Jardiance and then start Farxiga 10 mg daily Leone Haven, MD Taking Active   diphenoxylate-atropine (LOMOTIL) 2.5-0.025 MG tablet [id number] No Take 1 tablet by mouth in the morning and at bedtime.  Patient not taking: No sig reported   [provider] Not Taking Active   divalproex (DEPAKOTE ER) 500 MG 24 hr tablet [id number] Yes Take 1 tablet by mouth 2 (two) times daily. [provider] Taking Active   donepezil (ARICEPT) 10 MG tablet [id number] Yes Take 10 mg by mouth  at bedtime. [provider] Taking Active Self  doxycycline (VIBRA-TABS) 100 MG tablet [id number] Yes Take 1 tablet (100 mg total) by mouth 2 (two) times daily. [name] B, FNP  Active   fenofibrate (TRICOR) 145 MG tablet [id number] Yes TAKE ONE TABLET BY MOUTH DAILY Leone Haven, MD Taking Active   ferrous sulfate 325 (65 FE) MG EC tablet [id number] Yes Take 325 mg by mouth daily. [provider] Taking Active   gabapentin (NEURONTIN) 600 MG tablet [id number] Yes TAKE TWO TABLETS BY MOUTH THREE TIMES A DAY Leone Haven, MD Taking Active   Hyoscyamine Sulfate SL 0.125 MG SUBL [id number] Yes Place 1 tablet under the tongue daily as needed. [provider] Taking Active   ipratropium (ATROVENT) 0.03 % nasal spray [id number] Yes Place 2 sprays into both nostrils every 12 (twelve) hours. Leone Haven, MD Taking Active   lisinopril-hydrochlorothiazide (ZESTORETIC) 20-25 MG tablet [id number] Yes Take 1 tablet by mouth daily. Leone Haven, MD Taking Active   memantine [facility]) 10 MG tablet [id number] Yes Take 1 tablet by mouth daily. [provider] Taking Active   metoprolol succinate (TOPROL-XL) 25 MG 24 hr tablet [id number] Yes Take 25 mg by mouth daily. [provider] Taking Active   Multiple Vitamins-Minerals (CENTRUM SILVER PO) [id number] Yes Take 1 tablet by mouth daily. [provider] Taking Active   omega-3 acid ethyl esters (LOVAZA) 1 g capsule [id number] Yes  Take 2 capsules (2 g total) by mouth 2 (two) times daily. Leone Haven, MD Taking Active   omeprazole (PRILOSEC) 20 MG capsule [id number] Yes Take 20 mg by mouth daily. [provider] Taking Active Self           Med Note Josiah Lobo, KIMBERLY   Thu Aug 09, 2016 12:05 PM)    primidone (MYSOLINE) 50 MG tablet [id number] Yes Take 150-250 mg by mouth 2 (two) times daily. Take 250 mg by mouth in the morning & take 150 mg at night. [provider] Taking  Active Self  promethazine-dextromethorphan (PROMETHAZINE-DM) 6.25-15 MG/5ML syrup [id number] Yes Take 5 mLs by mouth 4 (four) times daily as needed for cough. [name] B, FNP  Active   QUEtiapine (SEROQUEL) 100 MG tablet [id number] Yes Take 150 mg by mouth at bedtime. [provider] Taking Active Pharmacy Records  Semaglutide,0.25 or 0.5MG /DOS, (OZEMPIC, 0.25 OR 0.5 MG/DOSE,) 2 MG/1.5ML SOPN [id number] Yes Inject 0.5 mg into the skin once a week. Leone Haven, MD Taking Active Pharmacy Records  simvastatin [facility]) 40 MG tablet [id number] Yes Take 40 mg by mouth daily. [provider] Taking Active   tamsulosin (FLOMAX) 0.4 MG CAPS capsule [id number] Yes TAKE ONE CAPSULE BY MOUTH DAILY AFTER Otilio Jefferson, Angela Adam, MD Taking Active   traZODone (DESYREL) 100 MG tablet [id number] Yes 150-200 mg at night as needed for sleep  Patient taking differently: 200 mg. 150-200 mg at night as needed for sleep   Norman Clay, MD Taking Active   varenicline (CHANTIX) 1 MG tablet [id number]  Take 1/2 tablet by mouth daily for 3 days, then 1/2 tablet by mouth twice daily for 4 days, then 1 tablet twice daily thereafter. Take with food. Leone Haven, MD  Active   vitamin C (ASCORBIC ACID) 500 MG tablet [id number] Yes Take 500 mg by mouth daily. [provider] Taking Active   Med List Note [name], RN 11/15/20 4562): MR 02/21/2021 UDS 11/15/2020            Lab Results  Component Value Date   HGBA1C 6.8 (A) 01/11/2021   Lab Results  Component Value Date   CREATININE 1.11 10/11/2020   BUN 32 (H) 10/11/2020   NA 137 10/11/2020   K 4.4 10/11/2020   CL 100 10/11/2020   CO2 28 10/11/2020   Lab Results  Component Value Date   CHOL 99 07/05/2020   HDL 43.20 07/05/2020   LDLCALC 57 09/25/2017   LDLDIRECT 31.0 07/05/2020   TRIG 226.0 (H) 07/05/2020   CHOLHDL 2 07/05/2020     Assessment/Interventions: Review of patient past medical history, allergies,  medications, health status, including review of consultants reports, laboratory and other test data, was performed as part of comprehensive evaluation and provision of chronic care management services.   SDOH:  (Social Determinants of Health) assessments and interventions performed: Yes SDOH Interventions    Flowsheet Row Most Recent Value  SDOH Interventions   SDOH Interventions for the Following Domains Tobacco  Financial Strain Interventions Other (Comment)  [manufacturer assistance]  Tobacco Interventions Cessation Materials Given and Reviewed, Other (Comment)  [medication access]        CCM Care Plan  Review of patient past medical history, allergies, medications, health status, including review of consultants reports, laboratory and other test data, was performed as part of comprehensive evaluation and provision of chronic care management services.   Conditions to be addressed/monitored:  Diabetes and Depression  Care Plan : Medication  Management  Updates made by De Hollingshead, RPH-CPP since 03/17/2021 12:00 AM     Problem: Diabetes, Depression      Long-Range Goal: Disease Progression Prevention   Start Date: 02/16/2020  This Visit's Progress: On track  Recent Progress: On track  Priority: Medium  Note:   Current Barriers:  Unable to independently afford treatment regimen  Pharmacist Clinical Goal(s):  Over the next 30 days, patient will verbalize ability to afford treatment regimen through collaboration with PharmD and provider.  Over the next 90 days, patient will maintain glycemic control as evidenced by A1c through collaboration with PharmD and provider.  Interventions: 1:1 collaboration with Leone Haven, MD regarding development and update of comprehensive plan of care as evidenced by provider attestation and co-signature Inter-disciplinary care team collaboration (see longitudinal plan of care) Comprehensive medication review performed; medication  list updated in electronic medical record  Diabetes: Controlled; current treatment: Farxiga 10 mg daily; Ozempic 0.5 mg weekly (max tolerated dose d/t GI upset/weight loss at 1 mg weekly) Approved for Ozempic and Farxiga assistance through 2022.  Hx metformin ER - significant diarrhea that resolved upon discontinuation Recommended to continue current regimen at this time. Will collaborate w/ CPhT, patient, and providers to reapply for patient assistance for Farxiga and [place] for 2023.  Hypertension: Controlled per last clinic reading; current treatment: lisinopril/HCTZ 20/25 mg daily, metoprolol succinate 25 mg daily Previously recommended to continue current regimen at this time  Hyperlipidemia: Controlled; current treatment: rosuvastatin 40 mg daily, fenofibrate 145 mg daily, OTC omega 3 fatty acids 2 g BID Generic Lovaza and Vascepa too expensive. [employer] funding closed.  Previously recommended to continue current regimen at this time  Depression/insomnia: Improved, sleep improved per patient report. Current regimen: bupropion XL 300 mg daily, trazodone 200 mg QPM. Follows w/ Dr. Modesta Messing and LCSW Psychiatry previously discussed reducing dose of quetiapine to reduce risk of EPS. Patient declined.  Previously recommended to continue current collaboration with psychiatry.   Chronic Pain: Improved per patient report; hydrocodone/APAP 10/325 mg up to TID, gabapentin 1200 mg TID; Follows w/ Dr. Holley Raring Previously recommended to continue current regimen at this time as prescribed  Neurologic Conditions (pseudo dementia, tremor, sleep disorder) Well managed per patient report; current regimen: memantine 10 mg BID, donepezil 10 mg daily; primidone 250 mg QAM, 150 mg QPM; divalproex 500 mg BID, quetiapine 150 mg QPM; Follows w/ Dr. Melrose Nakayama.  Previously recommended to continue current regimen at this time along with collaboration w/ Neurology  Chronic Diarrhea/Colitis,  Barrett's: Controlled per patient report; current regimen: budesonide 6 mg daily, Lomotil 1 tab QAM, hyoscyamine PRN added at last visit; follows w/ [facility] Barrett[facility]: omeprazole 20 mg daily Previously recommended to continue current regimen at this time  OAB/BPH: Uncontrolled, notes some continued incomplete emptying and increased urinary frequency; current treatment regimen: tamsulosin 0.4 mg daily; follows w/ Dr. Diamantina Providence Previously recommended to continue current regimen at this time and continue to communicate with urology if further treatment is needed.   Tobacco Abuse: 0.5-0.75 packs per day; reports it is just "too hard" and he is a "hopeless case" Previously approved for Tier Exception for varenicline, however, patient reported that [facility] had not filled the medication. Called [facility], medication was running through for a $120 copay. Called [employer]. Patient is in the Coverage Gap, so Tier Exceptions do not apply, the patient is charged 25% of the medication cost regardless. No other options to access this medication prior to January 1 when  patient's insurance coverage resets.  Previous quit attempts: unsuccessful using patches, gum. Currently unsuccessful on bupropion. Patient aware and amenable to plan. Will follow up in January.  Patient Goals/Self-Care Activities Over the next 90 days, patient will:  - take medications as prescribed check glucose at least 3 times daily using CGM, document, and provide at future appointments collaborate with provider on medication access solutions engage in dietary modifications by reducing carbohydrate portion sizes Focus on tobacco cessation  Follow Up Plan: Telephone follow up appointment with care management team member scheduled for: 12 weeks       Plan: Telephone follow up appointment with care management team member scheduled for:  12 weeks  Catie Darnelle Maffucci, PharmD, [place], [place] Pharmacist [employer] at [employer] [phone]

## 2021-03-20 DIAGNOSIS — F419 Anxiety disorder, unspecified: Secondary | ICD-10-CM | POA: Diagnosis not present

## 2021-03-20 DIAGNOSIS — I10 Essential (primary) hypertension: Secondary | ICD-10-CM

## 2021-03-20 DIAGNOSIS — E1142 Type 2 diabetes mellitus with diabetic polyneuropathy: Secondary | ICD-10-CM | POA: Diagnosis not present

## 2021-03-20 DIAGNOSIS — F32A Depression, unspecified: Secondary | ICD-10-CM | POA: Diagnosis not present

## 2021-03-20 DIAGNOSIS — E782 Mixed hyperlipidemia: Secondary | ICD-10-CM

## 2021-03-22 ENCOUNTER — Ambulatory Visit (INDEPENDENT_AMBULATORY_CARE_PROVIDER_SITE_OTHER): Payer: PPO | Admitting: Licensed Clinical Social Worker

## 2021-03-22 ENCOUNTER — Other Ambulatory Visit: Payer: Self-pay

## 2021-03-22 DIAGNOSIS — F3342 Major depressive disorder, recurrent, in full remission: Secondary | ICD-10-CM

## 2021-03-22 NOTE — Progress Notes (Addendum)
   THERAPIST PROGRESS NOTE  Session Time: 8-9am  Participation Level: Active  Behavioral Response: NeatAlertAnxious  Type of Therapy: Individual Therapy  Treatment Goals addressed:  Goal: LTG: Reduce frequency, intensity, and duration of depression symptoms as evidenced by: pt self report Outcome: Progressing Goal: STG: @PREFFIRSTNAME @ will participate in at least 80% of scheduled individual psychotherapy sessions Outcome: Progressing Interventions:  Intervention: REVIEW PLEASE SKILLS (TREAT PHYSICAL ILLNESS, BALANCE EATING, AVOID MOOD-ALTERING SUBSTANCES, BALANCE SLEEP AND GET EXERCISE) WITH Reynolds Bowl" Intervention: Assess emotional status and coping mechanisms   Summary: CODI KERTZ is a 80 y.o. male who presents with improving symptoms related to depression diagnosis. Pt reports current mood is stable and that he is managing stress and anxiety well. Pt reports that he is maintaining sober behavior and is currently sponsoring individuals through [place].  Reviewed current coping mechanisms for managing depression and anxiety. Discussed healthy communication patterns in relationships.  Continued recommendations are as follows: self care behaviors, positive social engagements, focusing on overall work/home/life balance, and focusing on positive physical and emotional wellness.  .   Suicidal/Homicidal: No  Therapist Response: Pt is continuing to apply interventions learned in session into daily life situations. Pt is currently on track to meet goals utilizing interventions mentioned above. Personal growth and progress noted. Treatment to continue as indicated.   Plan: Return again in 4 weeks.  Diagnosis: Axis I: MDD, remission    Axis II: No diagnosis    Rachel Bo Elba Schaber, LCSW 03/22/2021

## 2021-03-23 NOTE — Plan of Care (Signed)
  Problem: Decrease depressive symptoms and improve levels of effective functioning Goal: LTG: Reduce frequency, intensity, and duration of depression symptoms as evidenced by: pt self report Outcome: Progressing Goal: STG: @PREFFIRSTNAME @ will participate in at least 80% of scheduled individual psychotherapy sessions Outcome: Progressing Intervention: REVIEW PLEASE SKILLS (TREAT PHYSICAL ILLNESS, BALANCE EATING, AVOID MOOD-ALTERING SUBSTANCES, BALANCE SLEEP AND GET EXERCISE) WITH Reynolds Bowl" Intervention: Assess emotional status and coping mechanisms

## 2021-03-29 ENCOUNTER — Encounter: Payer: Self-pay | Admitting: Student in an Organized Health Care Education/Training Program

## 2021-03-29 ENCOUNTER — Ambulatory Visit
Payer: PPO | Attending: Student in an Organized Health Care Education/Training Program | Admitting: Student in an Organized Health Care Education/Training Program

## 2021-03-29 ENCOUNTER — Other Ambulatory Visit: Payer: Self-pay

## 2021-03-29 ENCOUNTER — Telehealth: Payer: Self-pay | Admitting: Pharmacy Technician

## 2021-03-29 VITALS — BP 104/70 | HR 81 | Temp 97.2°F | Resp 16 | Ht 70.0 in | Wt 165.0 lb

## 2021-03-29 DIAGNOSIS — M25512 Pain in left shoulder: Secondary | ICD-10-CM | POA: Diagnosis not present

## 2021-03-29 DIAGNOSIS — G894 Chronic pain syndrome: Secondary | ICD-10-CM | POA: Diagnosis not present

## 2021-03-29 DIAGNOSIS — F1190 Opioid use, unspecified, uncomplicated: Secondary | ICD-10-CM

## 2021-03-29 DIAGNOSIS — M19072 Primary osteoarthritis, left ankle and foot: Secondary | ICD-10-CM | POA: Diagnosis not present

## 2021-03-29 DIAGNOSIS — M19012 Primary osteoarthritis, left shoulder: Secondary | ICD-10-CM | POA: Diagnosis not present

## 2021-03-29 DIAGNOSIS — Z596 Low income: Secondary | ICD-10-CM

## 2021-03-29 DIAGNOSIS — K5903 Drug induced constipation: Secondary | ICD-10-CM | POA: Insufficient documentation

## 2021-03-29 DIAGNOSIS — G8929 Other chronic pain: Secondary | ICD-10-CM

## 2021-03-29 DIAGNOSIS — T402X5A Adverse effect of other opioids, initial encounter: Secondary | ICD-10-CM

## 2021-03-29 MED ORDER — HYDROCODONE-ACETAMINOPHEN 10-325 MG PO TABS: 1.0000 | ORAL_TABLET | ORAL | 0 refills | Status: AC | PRN

## 2021-03-29 MED ORDER — HYDROCODONE-ACETAMINOPHEN 10-325 MG PO TABS: 1.0000 | ORAL_TABLET | ORAL | 0 refills | Status: DC | PRN

## 2021-03-29 NOTE — Progress Notes (Signed)
PROVIDER NOTE: Information contained herein reflects review and annotations entered in association with encounter. Interpretation of such information and data should be left to medically-trained personnel. Information provided to patient can be located elsewhere in the medical record under "Patient Instructions". Document created using STT-dictation technology, any transcriptional errors that may result from process are unintentional.    Patient: James Hood  Service Category: E/M  Provider: [name], MD  DOB: April 03, 1941  DOS: 03/29/2021  Specialty: Interventional Pain Management  MRN: [id number]  Setting: Ambulatory outpatient  PCP: Leone Haven, MD  Type: Established Patient    Referring Provider: Leone Haven, MD  Location: Office  Delivery: Face-to-face     HPI  [name], a 80 y.o. 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 James Hood's primary complain today is Ankle Pain (left) and Shoulder Pain (left) Last encounter: My last encounter with him was on 02/01/2021. Pertinent problems: James Hood has Primary localized osteoarthritis of right knee; DJD (degenerative joint disease) of knee; Chronic lumbar pain; Lumbar herniated disc; Chronic pain syndrome; Failed back surgical syndrome; Lumbosacral radiculopathy; Facet arthritis of lumbar region; Neuropathy; Chronic pain of left ankle; and Impingement of left ankle joint on their pertinent problem list. Pain Assessment: Severity of Chronic pain is reported as a 1  (shoulder 4)/10. Location: Ankle Right/ . Onset: More than a month ago. Quality: Aching. Timing: Intermittent. Modifying factor(s):  Marland Kitchen Vitals:  height is _0  (1.778 m) and weight is 165 lb (74.8 kg). His temporal temperature is 97.2 F (36.2 C) (abnormal). His blood pressure is 104/70 and his pulse is 81. His respiration is 16 and oxygen saturation is 96%.   Reason for encounter: both, medication  management and post-procedure assessment.    Post-Procedure Evaluation  Procedure (02/01/2021):   Type: Diagnostic Glenohumeral Joint (shoulder) Injection          Primary Purpose: Diagnostic Region: Superior Shoulder Area Level:  Shoulder Target Area: Glenohumeral Joint (shoulder) Approach: Anterior approach. Laterality: Left-Sided  Anxiolysis: Please see nurses note.  Effectiveness during initial hour after procedure (Ultra-Short Term Relief): 100 %   Local anesthetic used: Long-acting (4-6 hours) Effectiveness: Defined as any analgesic benefit obtained secondary to the administration of local anesthetics. This carries significant diagnostic value as to the etiological location, or anatomical origin, of the pain. Duration of benefit is expected to coincide with the duration of the local anesthetic used.  Effectiveness during initial 4-6 hours after procedure (Short-Term Relief): 100 %   Long-term benefit: Defined as any relief past the pharmacologic duration of the local anesthetics.  Effectiveness past the initial 6 hours after procedure (Long-Term Relief): 100 %   Benefits, current: Defined as benefit present at the time of this evaluation.   Analgesia:  100% Function: Somewhat improved   Patient follows up today for postprocedural evaluation.  Significant pain relief after left glenohumeral steroid joint injection which was done anteriorly.  He is now endorsing posterior pain along his scapular spine that also radiates anteriorly and laterally across the shoulder.  This is consistent with suprascapular nerve entrapment and we discussed a diagnostic left suprascapular nerve block.  Risks and benefits reviewed and patient would like to trial.   Pharmacotherapy Assessment  Analgesic: Hydrocodone 10 mg every 4 hours as needed, quantity 180/month; MME equals 60    Monitoring: [place] PMP: PDMP reviewed during this encounter.       Pharmacotherapy: No side-effects or adverse reactions  reported. Compliance: No problems identified. Effectiveness: Clinically acceptable.  Landis Martins, RN  03/29/2021  1:39 PM  Sign when Signing Visit Nursing Pain Medication Assessment:  Safety precautions to be maintained throughout the outpatient stay will include: orient to surroundings, keep bed in low position, maintain call bell within reach at all times, provide assistance with transfer out of bed and ambulation.  Medication Inspection Compliance: Pill count conducted under aseptic conditions, in front of the patient. Neither the pills nor the bottle was removed from the patient's sight at any time. Once count was completed pills were immediately returned to the patient in their original bottle.  Medication: See above Pill/Patch Count:  107 of 180 pills remain Pill/Patch Appearance: Markings consistent with prescribed medication Bottle Appearance: Standard pharmacy container. Clearly labeled. Filled Date: 41 / 17 / 2022 Last Medication intake:  Today      UDS:  Summary  Date Value Ref Range Status  11/15/2020 Note  Final    Comment:    ==================================================================== [employer] [facility] (MW) ==================================================================== Test                             Result       Flag       Units  Drug Present and Declared for Prescription Verification   Hydrocodone                    1623         EXPECTED   ng/mg creat   Hydromorphone                  226          EXPECTED   ng/mg creat   Dihydrocodeine                 105          EXPECTED   ng/mg creat   Norhydrocodone                 3285         EXPECTED   ng/mg creat    Sources of hydrocodone include scheduled prescription medications.    Hydromorphone, dihydrocodeine and norhydrocodone are expected    metabolites of hydrocodone. Hydromorphone and dihydrocodeine are    also available as scheduled prescription medications.    Phenobarbital                   PRESENT      EXPECTED    Phenobarbital is an expected metabolite of primidone; Phenobarbital    may also be administered as a prescription drug.  ==================================================================== Test                      Result    Flag   Units      Ref Range   Creatinine              74               mg/dL      >=20 ==================================================================== Declared Medications:  The flagging and interpretation on this report are based on the  following declared medications.  Unexpected results may arise from  inaccuracies in the declared medications.   **Note: The testing scope of this panel includes these medications:   Hydrocodone (Norco)  Primidone (Mysoline)   **Note: The testing scope of this panel does not include the  following  reported medications:   Acetaminophen (Norco)  Aspirin  Atropine (Lomotil)  Budesonide (Entocort)  Bupropion (Wellbutrin)  Diphenoxylate (Lomotil)  Divaleproex (Depakote)  Donepezil (Aricept)  Empagliflozin (Jardiance)  Fenofibrate (TriCor)  Finasteride (Proscar)  Fish Oil  Gabapentin (Neurontin)  Hydrochlorothiazide  Hyoscyamine  Iron  Lisinopril  Memantine (Namenda)  Metoprolol (Toprol)  Mirabegron (Myrbetriq)  Multivitamin  Naloxegol (Movantik)  Omeprazole (Prilosec)  Quetiapine (Seroquel)  Semaglutide (Ozempic)  Tamsulosin (Flomax)  Trazodone (Desyrel) ==================================================================== For clinical consultation, please call [phone]. ====================================================================      ROS  Constitutional: Denies any fever or chills Gastrointestinal: No reported hemesis, hematochezia, vomiting, or acute GI distress Musculoskeletal:  Improved left anterior glenohumeral joint pain, positive for left suprascapular tenderness and pain Neurological: No reported episodes of acute onset apraxia, aphasia, dysarthria,  agnosia, amnesia, paralysis, loss of coordination, or loss of consciousness  Medication Review  FreeStyle Libre 14 Day Sensor, HYDROcodone-acetaminophen, Hyoscyamine Sulfate SL, Multiple Vitamins-Minerals, QUEtiapine, Semaglutide(0.25 or 0.5MG/DOS), aspirin EC, buPROPion, budesonide, dapagliflozin propanediol, divalproex, donepezil, doxycycline, fenofibrate, ferrous sulfate, gabapentin, ipratropium, lisinopril-hydrochlorothiazide, memantine, metoprolol succinate, omega-3 acid ethyl esters, omeprazole, primidone, promethazine-dextromethorphan, simvastatin, tamsulosin, traZODone, varenicline, and vitamin C  History Review  Allergy: James Hood has no active allergies. Drug: James Hood  reports no history of drug use. Alcohol:  reports that he does not currently use alcohol. Tobacco:  reports that he has been smoking cigarettes. He has a 30.00 pack-year smoking history. He has never used smokeless tobacco. Social: James Hood  reports that he has been smoking cigarettes. He has a 30.00 pack-year smoking history. He has never used smokeless tobacco. He reports that he does not currently use alcohol. He reports that he does not use drugs. 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 Family: family history includes Depression in his father; Heart attack in his mother; Heart disease in his mother; Hypertension in his father.  Laboratory Chemistry Profile   Renal Lab Results  Component Value Date   BUN 32 (H) 10/11/2020   CREATININE 1.11 10/11/2020   GFR 62.63 10/11/2020   GFRAA >60 09/25/2019   GFRNONAA 55 (L) 06/08/2020    Hepatic Lab Results  Component Value Date   AST 15 10/11/2020   ALT 11 10/11/2020   ALBUMIN 4.1 10/11/2020   ALKPHOS 41 10/11/2020   AMMONIA 14 08/09/2016    Electrolytes Lab Results  Component Value Date   NA 137 10/11/2020   K 4.4 10/11/2020   CL 100 10/11/2020   CALCIUM 9.8 10/11/2020   MG 1.4 (L) 09/08/2020    Bone Lab Results  Component Value Date   VD25OH 24.57 (L) 10/11/2020    Inflammation (CRP: Acute Phase) (ESR: Chronic Phase) Lab Results  Component Value Date   ESRSEDRATE 15 07/20/2019  LATICACIDVEN 1.9 06/08/2020         Note: Above Lab results reviewed.   Physical Exam  General appearance: Well nourished, well developed, and well hydrated. In no apparent acute distress Mental status: Alert, oriented x 3 (person, place, & time)       Respiratory: No evidence of acute respiratory distress Eyes: PERLA Vitals: BP 104/70   Pulse 81   Temp (!) 97.2 F (36.2 C) (Temporal)   Resp 16   Ht $R'5\' 10"'em$  (1.778 m)   Wt 165 lb (74.8 kg)   SpO2 96%   BMI 23.68 kg/m  BMI: Estimated body mass index is 23.68 kg/m as calculated from the following:   Height as of this encounter: $RemoveBeforeD'5\' 10"'[id number]$  (1.778 m).   Weight as of this encounter: 165 lb (74.8 kg). Ideal: Ideal body  weight: 73 kg (160 lb 15 oz) Adjusted ideal body weight: 73.7 kg (162 lb 9 oz)  Cervical Spine Area Exam  Skin & Axial Inspection: No masses, redness, edema, swelling, or associated skin lesions Alignment: Symmetrical Functional ROM: Unrestricted ROM      Stability: No instability detected Muscle Tone/Strength: Functionally intact. No obvious neuro-muscular anomalies detected. Sensory (Neurological): Unimpaired Palpation: No palpable anomalies             Upper Extremity (UE) Exam    Side: Right upper extremity  Side: Left upper extremity  Skin & Extremity Inspection: Skin color, temperature, and hair growth are WNL. No peripheral edema or cyanosis. No masses, redness, swelling, asymmetry, or associated skin lesions. No contractures.  Skin & Extremity Inspection: Skin color, temperature, and hair growth are WNL. No peripheral edema or cyanosis. No masses, redness, swelling, asymmetry, or associated skin lesions. No contractures.  Functional ROM: Unrestricted ROM          Functional ROM: Unrestricted ROM          Muscle Tone/Strength: Functionally intact. No obvious neuro-muscular anomalies detected.  Muscle Tone/Strength: Functionally intact. No obvious neuro-muscular anomalies detected.  Sensory (Neurological): Unimpaired          Sensory (Neurological): positive for posterior shoulder pain  Palpation: No palpable anomalies              Palpation: No palpable anomalies              Provocative Test(s):  Phalen's test: deferred Tinel's test: deferred Apley's scratch test (touch opposite shoulder):  Action 1 (Across chest): deferred Action 2 (Overhead): deferred Action 3 (LB reach): deferred   Provocative Test(s):  Phalen's test: deferred Tinel's test: deferred Apley's scratch test (touch opposite shoulder):  Action 1 (Across chest): deferred Action 2 (Overhead): Decreased ROM Action 3 (LB reach): deferred     Assessment   Status Diagnosis  Responding Responding Controlled 1.  Primary osteoarthritis of left shoulder   2. Chronic left shoulder pain   3. Therapeutic opioid-induced constipation (OIC)   4. Primary osteoarthritis of left ankle   5. Chronic pain syndrome   6. Chronic, continuous use of opioids       Plan of Care  Problem-specific:  No problem-specific Assessment & Plan notes found for this encounter.  James Hood has a current medication list which includes the following long-term medication(s): bupropion, donepezil, fenofibrate, gabapentin, hyoscyamine sulfate sl, ipratropium, lisinopril-hydrochlorothiazide, memantine, metoprolol succinate, omega-3 acid ethyl esters, omeprazole, primidone, quetiapine, and trazodone.  Pharmacotherapy (Medications Ordered): Meds ordered this encounter  Medications   HYDROcodone-acetaminophen (NORCO) 10-325 MG tablet    Sig: Take 1 tablet by  mouth every 4 (four) hours as needed for severe pain. Must last 30 days.    Dispense:  180 tablet    Refill:  0    Chronic Pain: STOP Act (Not applicable) Fill 1 day early if closed on refill date. Avoid benzodiazepines within 8 hours of opioids   HYDROcodone-acetaminophen (NORCO) 10-325 MG tablet    Sig: Take 1 tablet by mouth every 4 (four) hours as needed for severe pain. Must last 30 days.    Dispense:  180 tablet    Refill:  0    Chronic Pain: STOP Act (Not applicable) Fill 1 day early if closed on refill date. Avoid benzodiazepines within 8 hours of opioids   HYDROcodone-acetaminophen (NORCO) 10-325 MG tablet    Sig: Take 1 tablet by mouth every 4 (four) hours as needed for severe pain. Must last 30 days.    Dispense:  180 tablet    Refill:  0    Chronic Pain: STOP Act (Not applicable) Fill 1 day early if closed on refill date. Avoid benzodiazepines within 8 hours of opioids   Continue gabapentin as prescribed.  No refills needed.   Orders:  Orders Placed This Encounter  Procedures   SUPRASCAPULAR NERVE BLOCK    For shoulder pain.    Standing Status:    Future    Standing Expiration Date:   06/29/2021    Scheduling Instructions:     LEFT     Level(s): Suprascapular notch     Sedation: LOCAL     Scheduling Timeframe: As permitted by the schedule    Order Specific Question:   Where will this procedure be performed?    Answer:   [facility]    Follow-up plan:   Return in about 2 weeks (around 04/12/2021) for LEFT SSNB , without sedation.    Recent Visits Date Type Provider Dept  02/01/21 Procedure visit [name], MD [facility]  01/25/21 Office Visit [name], MD [facility]  Showing recent visits within past 90 days and meeting all other requirements Today's Visits Date Type Provider Dept  03/29/21 Office Visit [name], MD [facility]  Showing today's visits and meeting all other requirements Future Appointments No visits were found meeting these conditions. Showing future appointments within next 90 days and meeting all other requirements I discussed the assessment and treatment plan with the patient. The patient was provided an opportunity to ask questions and all were answered. The patient agreed with the plan and demonstrated an understanding of the instructions.  Patient advised to call back or seek an in-person evaluation if the symptoms or condition worsens.  Duration of encounter: 30 minutes.  Note by: [name], MD Date: 03/29/2021; Time: 2:06 PM

## 2021-03-29 NOTE — Progress Notes (Addendum)
James Hood [facility])                                            [place] Team    05/02/2021  Successful outreach placed to patient, HIPAA verified. Patient informs he did not receive the 03/29/2021 mailing of the [employer] application. Prepared another application and placed in mail today.  James Hood P. James Hood, [place]  [phone]  03/29/2021  James Hood 07-06-1940 [id number]  FOR 2023 RE ENROLLMENT                                      Medication Assistance Referral  Referral From: [place]   Medication/Company: [name] / [place] Patient application portion:  N/A patient should automatically be re enrolled since patient has Part D per [place] guidelines Provider application portion:  N/A embedded PharmD to have signed in office  to Dr. Caryl Bis Provider address/fax verified via: Office website  Medication/Company: [name] / [employer] Patient application portion:  Mailed Provider application portion:  N/A embedded PharmD to have signed in clinic  to Dr. Caryl Bis Provider address/fax verified via: Office website   [name], [place]  [phone]

## 2021-03-29 NOTE — Progress Notes (Signed)
Nursing Pain Medication Assessment:  Safety precautions to be maintained throughout the outpatient stay will include: orient to surroundings, keep bed in low position, maintain call bell within reach at all times, provide assistance with transfer out of bed and ambulation.  Medication Inspection Compliance: Pill count conducted under aseptic conditions, in front of the patient. Neither the pills nor the bottle was removed from the patient's sight at any time. Once count was completed pills were immediately returned to the patient in their original bottle.  Medication: See above Pill/Patch Count:  107 of 180 pills remain Pill/Patch Appearance: Markings consistent with prescribed medication Bottle Appearance: Standard pharmacy container. Clearly labeled. Filled Date: 39 / 17 / 2022 Last Medication intake:  Today

## 2021-03-29 NOTE — Patient Instructions (Signed)

## 2021-03-30 DIAGNOSIS — R091 Pleurisy: Secondary | ICD-10-CM | POA: Diagnosis not present

## 2021-04-03 DIAGNOSIS — Z8709 Personal history of other diseases of the respiratory system: Secondary | ICD-10-CM | POA: Diagnosis not present

## 2021-04-03 DIAGNOSIS — I129 Hypertensive chronic kidney disease with stage 1 through stage 4 chronic kidney disease, or unspecified chronic kidney disease: Secondary | ICD-10-CM | POA: Diagnosis not present

## 2021-04-03 DIAGNOSIS — N183 Chronic kidney disease, stage 3 unspecified: Secondary | ICD-10-CM | POA: Diagnosis not present

## 2021-04-03 DIAGNOSIS — F17200 Nicotine dependence, unspecified, uncomplicated: Secondary | ICD-10-CM | POA: Diagnosis not present

## 2021-04-03 DIAGNOSIS — G629 Polyneuropathy, unspecified: Secondary | ICD-10-CM | POA: Diagnosis not present

## 2021-04-03 DIAGNOSIS — J449 Chronic obstructive pulmonary disease, unspecified: Secondary | ICD-10-CM | POA: Diagnosis not present

## 2021-04-10 ENCOUNTER — Ambulatory Visit: Payer: PPO | Admitting: Student in an Organized Health Care Education/Training Program

## 2021-04-10 ENCOUNTER — Encounter: Payer: Self-pay | Admitting: Podiatry

## 2021-04-10 ENCOUNTER — Ambulatory Visit (INDEPENDENT_AMBULATORY_CARE_PROVIDER_SITE_OTHER): Payer: PPO | Admitting: Podiatry

## 2021-04-10 DIAGNOSIS — J449 Chronic obstructive pulmonary disease, unspecified: Secondary | ICD-10-CM | POA: Diagnosis not present

## 2021-04-10 DIAGNOSIS — M79675 Pain in left toe(s): Secondary | ICD-10-CM

## 2021-04-10 DIAGNOSIS — B351 Tinea unguium: Secondary | ICD-10-CM

## 2021-04-10 DIAGNOSIS — M79674 Pain in right toe(s): Secondary | ICD-10-CM

## 2021-04-10 NOTE — Progress Notes (Signed)
No show due to sickness.

## 2021-04-10 NOTE — Progress Notes (Deleted)
NO SHOW

## 2021-04-11 ENCOUNTER — Other Ambulatory Visit: Payer: Self-pay

## 2021-04-11 ENCOUNTER — Encounter: Payer: Self-pay | Admitting: Family

## 2021-04-11 ENCOUNTER — Telehealth (INDEPENDENT_AMBULATORY_CARE_PROVIDER_SITE_OTHER): Payer: PPO | Admitting: Family

## 2021-04-11 VITALS — BP 120/80 | Temp 101.4°F | Wt 165.0 lb

## 2021-04-11 DIAGNOSIS — U071 COVID-19: Secondary | ICD-10-CM | POA: Diagnosis not present

## 2021-04-11 DIAGNOSIS — J029 Acute pharyngitis, unspecified: Secondary | ICD-10-CM

## 2021-04-11 NOTE — Progress Notes (Signed)
Virtual Visit via Video   I connected with patient on 04/11/21 at  9:00 AM EST by a video enabled telemedicine application and verified that I am speaking with the correct person using two identifiers.  Location patient: Home Location provider: [employer], Office Persons participating in the virtual visit: Patient, Provider, CMA  I discussed the limitations of evaluation and management by telemedicine and the availability of in person appointments. The patient expressed understanding and agreed to proceed.  Subjective:   HPI:  80 year old male presents with c/o sore throat, low grade fever, sinus drainage and fatigue x 3 days. He reports testing positive for COVID-19 2 days ag by home rapid antigen test. Wife in the home and is well. Has been taking Mucincex and Tylenol that has helped. His symptoms today are better. No longer has fever. He is vaccinated and boosted.  ROS:   See pertinent positives and negatives per HPI.  Patient Active Problem List   Diagnosis Date Noted   Primary osteoarthritis of left shoulder 01/25/2021   Rhinorrhea 01/11/2021   Skin lesions 01/11/2021   Chronic, continuous use of opioids 11/15/2020   Therapeutic opioid-induced constipation (OIC) 11/15/2020   Fatigue 10/11/2020   OAB (overactive bladder) 08/08/2020   AKI (acute kidney injury) ([place]) 08/08/2020   Leg cramps 08/08/2020   Prolonged Q-T interval on ECG 06/08/2020   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   Right leg pain 02/05/2020   Impingement of left ankle joint    Sleeping difficulty 08/24/2019   Hiccups 08/24/2019   Chronic pain of left ankle 02/27/2019   Vitamin D deficiency 02/18/2019   Chronic left shoulder pain 02/02/2019   Neuropathy 09/24/2018   Lumbosacral radiculopathy 01/14/2018   Primary osteoarthritis of left ankle 01/14/2018   Facet arthritis of lumbar region 01/14/2018   Chronic pain syndrome 08/26/2017   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   Thyroid nodule 02/27/2017    Fatty liver 02/27/2017   Purpura ([place]) 01/29/2017   Right hip pain 08/16/2016   Lumbar herniated disc 02/03/2016   Hypertriglyceridemia 12/09/2015   Anemia 12/09/2015   Falls 09/16/2015   Barrett's esophagus 09/16/2015   Chronic lumbar pain 09/16/2015   Benign prostatic hyperplasia 09/16/2015   Trochanteric bursitis of left hip 09/09/2015   Headache 09/09/2015   Difficulty in walking 06/01/2015   Amnesia 06/01/2015   HLD (hyperlipidemia) 02/23/2015   DJD (degenerative joint disease) of knee 11/15/2014   Hypertension    Tremor, essential    Primary localized osteoarthritis of right knee    Anxiety and depression    Absence of sensation 11/18/2013   Tobacco abuse 11/18/2013   Chronic diarrhea 11/12/2013   Benign neoplasm of colon 08/25/2013   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   Type 2 diabetes mellitus ([place]) 10/06/2012    Social History   Tobacco Use   Smoking status: Every Day    Packs/day: 0.50    Years: 60.00    Pack years: 30.00    Types: Cigarettes   Smokeless tobacco: Never   Tobacco comments:    Last one at 0400  Substance Use Topics   Alcohol use: Not Currently    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 REMOVE OLD SENSOR NEFORE APPLYING NEW SENSOR, Disp: 2 each, Rfl: 5   dapagliflozin propanediol (FARXIGA) 10 MG TABS tablet, Complete supply of Jardiance and then start Farxiga 10 mg daily, Disp: 90 tablet, Rfl: 3   divalproex (DEPAKOTE ER) 500 MG 24 hr tablet, Take 1 tablet by mouth 2 (two) times daily., Disp: , Rfl:     donepezil (ARICEPT) 10 MG tablet, Take 10 mg by mouth at bedtime., Disp: , Rfl:    doxycycline (VIBRA-TABS) 100 MG tablet, Take 1 tablet (100 mg total) by mouth 2 (two) times daily., Disp: 20 tablet, Rfl: 0   fenofibrate (TRICOR) 145 MG tablet, TAKE ONE TABLET BY MOUTH DAILY, Disp: 90 tablet, Rfl: 1   ferrous sulfate 325 (65 FE) MG EC tablet, Take 325 mg by mouth daily., Disp: , Rfl:    gabapentin (NEURONTIN) 600 MG tablet, TAKE TWO TABLETS BY MOUTH THREE TIMES A DAY, Disp: 180 tablet, Rfl: 1   HYDROcodone-acetaminophen (NORCO) 10-325 MG tablet, Take 1 tablet by mouth every 4 (four) hours as needed for severe pain. Must last 30 days., Disp: 180 tablet, Rfl: 0   [START ON 05/06/2021] HYDROcodone-acetaminophen (NORCO) 10-325 MG tablet, Take 1 tablet by mouth every 4 (four) hours as needed for severe pain. Must last 30 days., Disp: 180 tablet, Rfl: 0   [START ON 06/05/2021] HYDROcodone-acetaminophen (NORCO) 10-325 MG tablet, Take 1 tablet by mouth every 4 (four) hours as needed for severe pain. Must last 30 days., Disp: 180 tablet, Rfl: 0   Hyoscyamine Sulfate SL 0.125 MG SUBL, Place 1 tablet under the tongue daily as needed., Disp: , Rfl:    ipratropium (ATROVENT) 0.03 % nasal spray, Place 2 sprays into both nostrils every 12 (twelve) hours., Disp: 30 mL, Rfl: 12   lisinopril-hydrochlorothiazide (ZESTORETIC) 20-25 MG tablet, Take 1 tablet by mouth daily., Disp: 90 tablet, Rfl: 1   memantine (NAMENDA) 10 MG tablet, Take 1 tablet by mouth daily., Disp: , Rfl:    metoprolol succinate (TOPROL-XL) 25 MG 24 hr tablet, Take 25 mg by mouth daily., Disp: , Rfl:    Multiple Vitamins-Minerals (CENTRUM SILVER PO), Take 1 tablet by mouth daily., Disp: , Rfl:    omega-3 acid ethyl esters (LOVAZA) 1 g capsule, Take 2 capsules (2 g total) by mouth 2 (two) times daily., Disp: 360 capsule, Rfl: 3   omeprazole (PRILOSEC) 20 MG capsule, Take 20 mg by mouth daily., Disp: , Rfl:    primidone (MYSOLINE) 50 MG tablet, Take  150-250 mg by mouth 2 (two) times daily. Take 250 mg by mouth in the morning & take 150 mg at night., Disp: , Rfl:    promethazine-dextromethorphan (PROMETHAZINE-DM) 6.25-15 MG/5ML syrup, Take 5 mLs by mouth 4 (four) times daily as needed for cough., Disp: 118 mL, Rfl: 0   QUEtiapine (SEROQUEL) 100 MG tablet, Take 150 mg by mouth at bedtime., Disp: , Rfl:    Semaglutide,0.25 or 0.5MG /DOS, (OZEMPIC, 0.25 OR 0.5 MG/DOSE,) 2 MG/1.5ML SOPN, Inject 0.5 mg into the skin once a week., Disp: 1.5 mL, Rfl: 3   simvastatin (ZOCOR) 40 MG tablet, Take 40 mg by mouth daily., Disp: , Rfl:    tamsulosin (FLOMAX) 0.4 MG CAPS capsule, TAKE ONE CAPSULE BY MOUTH DAILY AFTER DINNER, Disp: 90 capsule, Rfl: 0   traZODone (  DESYREL) 100 MG tablet, 150-200 mg at night as needed for sleep (Patient taking differently: 200 mg. 150-200 mg at night as needed for sleep), Disp: 180 tablet, Rfl: 0   varenicline (CHANTIX) 1 MG tablet, Take 1/2 tablet by mouth daily for 3 days, then 1/2 tablet by mouth twice daily for 4 days, then 1 tablet twice daily thereafter. Take with food., Disp: 60 tablet, Rfl: 5   vitamin C (ASCORBIC ACID) 500 MG tablet, Take 500 mg by mouth daily., Disp: , Rfl:   No Active Allergies  Objective:   BP 120/80 Comment: Patient reported.  Temp (!) 101.4 F (38.6 C) (Tympanic)   Wt 165 lb (74.8 kg)   BMI 23.68 kg/m   Patient is well-developed, well-nourished in no acute distress.  Resting comfortably at home.  Head is normocephalic, atraumatic.  No labored breathing.  Speech is clear and coherent with logical content.  Patient is alert and oriented at baseline.    Assessment and Plan:  Regie was seen today for covid positive.  Diagnoses and all orders for this visit:  COVID-19  Acute pharyngitis, unspecified etiology  Patient is stable and improving. Advised isolation x 5 days from the start of the symptoms. Wear a mask an additional 5 days. Rest, drink plenty of fluids. Call the office if  symptoms worsen or persist.    Kennyth Arnold, FNP 04/11/2021  Time spent with the patient: 20 minutes, of which >50% was spent in obtaining information about symptoms, reviewing previous labs, evaluations, and treatments, counseling about condition (please see the discussed topics above), and developing a plan to further investigate it; had a number of questions which I addressed.

## 2021-04-18 ENCOUNTER — Ambulatory Visit: Payer: PPO | Admitting: Pharmacist

## 2021-04-18 DIAGNOSIS — E1142 Type 2 diabetes mellitus with diabetic polyneuropathy: Secondary | ICD-10-CM

## 2021-04-18 DIAGNOSIS — I10 Essential (primary) hypertension: Secondary | ICD-10-CM

## 2021-04-18 MED ORDER — DAPAGLIFLOZIN PROPANEDIOL 10 MG PO TABS: 10.0000 mg | ORAL_TABLET | Freq: Every day | ORAL | 3 refills | Status: DC

## 2021-04-18 NOTE — Chronic Care Management (AMB) (Signed)
Chronic Care Management CCM Pharmacy Note  04/18/2021 Name:  JACKIE LITTLEJOHN MRN:  [id number] DOB:  12-28-1940  Summary: - Refill needed for [name] for [place] patient assistance  Recommendations/Changes made from today's visit: - Farxiga escribed to [facility] pharmacy  Subjective: DOMINQUE MARLIN is an 80 y.o. year old male who is a primary patient of Caryl Bis, Angela Adam, MD.  The CCM team was consulted for assistance with disease management and care coordination needs.    Care coordination  for  medication access  for pharmacy case management and/or care coordination services.   Objective:  Medications Reviewed Today     Reviewed by Kennyth Arnold, FNP (Family Nurse Practitioner) on 04/11/21 at 0830  Med List Status: <None>   Medication Order Taking? Sig Documenting Provider Last Dose Status Informant  aspirin EC 81 MG tablet [id number] Yes Take 81 mg by mouth daily. [provider] Taking Active            Med Note Vanessa [place], SUSAN   Tue Apr 15, 2018  8:47 AM)    budesonide (ENTOCORT EC) 3 MG 24 hr capsule [id number] Yes Take 6 mg by mouth daily. [provider] Taking Active   buPROPion (WELLBUTRIN XL) 300 MG 24 hr tablet [id number] Yes Take 1 tablet (300 mg total) by mouth daily. Norman Clay, MD Taking Active   Continuous Blood Gluc Sensor (FREESTYLE LIBRE 14 DAY SENSOR) MISC [id number] Yes APPLY ONE SENSOR EVERY 14 DAYS TO MONITOR BLOOD GLUCOSE LEVELS. [place] Leone Haven, MD Taking Active   dapagliflozin propanediol (FARXIGA) 10 MG TABS tablet [id number] Yes Complete supply of Jardiance and then start Farxiga 10 mg daily Leone Haven, MD Taking Active   divalproex (DEPAKOTE ER) 500 MG 24 hr tablet [id number] Yes Take 1 tablet by mouth 2 (two) times daily. [provider] Taking Active   donepezil (ARICEPT) 10 MG tablet [id number] Yes Take 10 mg by mouth at bedtime. [provider]  Taking Active Self  doxycycline (VIBRA-TABS) 100 MG tablet [id number] Yes Take 1 tablet (100 mg total) by mouth 2 (two) times daily. [name] B, FNP Taking Active   fenofibrate (TRICOR) 145 MG tablet [id number] Yes TAKE ONE TABLET BY MOUTH DAILY Leone Haven, MD Taking Active   ferrous sulfate 325 (65 FE) MG EC tablet [id number] Yes Take 325 mg by mouth daily. [provider] Taking Active   gabapentin (NEURONTIN) 600 MG tablet [id number] Yes TAKE TWO TABLETS BY MOUTH THREE TIMES A DAY Leone Haven, MD Taking Active   HYDROcodone-acetaminophen [facility]) 10-325 MG tablet [id number] Yes Take 1 tablet by mouth every 4 (four) hours as needed for severe pain. Must last 30 days. [name], MD Taking Active   HYDROcodone-acetaminophen [facility]) 10-325 MG tablet [id number] Yes Take 1 tablet by mouth every 4 (four) hours as needed for severe pain. Must last 30 days. [name], MD Taking Active   HYDROcodone-acetaminophen [facility]) 10-325 MG tablet [id number] Yes Take 1 tablet by mouth every 4 (four) hours as needed for severe pain. Must last 30 days. [name], MD Taking Active   Hyoscyamine Sulfate SL 0.125 MG SUBL [id number] Yes Place 1 tablet under the tongue daily as needed. [provider] Taking Active   ipratropium (ATROVENT) 0.03 % nasal spray [id number] Yes Place 2 sprays into both nostrils every 12 (twelve) hours. Leone Haven, MD Taking Active   lisinopril-hydrochlorothiazide (ZESTORETIC) 20-25 MG tablet [id number] Yes Take 1  tablet by mouth daily. Leone Haven, MD Taking Active   memantine [facility]) 10 MG tablet [id number] Yes Take 1 tablet by mouth daily. [provider] Taking Active   metoprolol succinate (TOPROL-XL) 25 MG 24 hr tablet [id number] Yes Take 25 mg by mouth daily. [provider] Taking Active   Multiple Vitamins-Minerals (CENTRUM SILVER PO) [id number] Yes Take 1 tablet by mouth daily. [provider] Taking Active    omega-3 acid ethyl esters (LOVAZA) 1 g capsule [id number] Yes Take 2 capsules (2 g total) by mouth 2 (two) times daily. Leone Haven, MD Taking Active   omeprazole (PRILOSEC) 20 MG capsule [id number] Yes Take 20 mg by mouth daily. [provider] Taking Active Self           Med Note Josiah Lobo, KIMBERLY   Thu Aug 09, 2016 12:05 PM)    primidone (MYSOLINE) 50 MG tablet [id number] Yes Take 150-250 mg by mouth 2 (two) times daily. Take 250 mg by mouth in the morning & take 150 mg at night. [provider] Taking Active Self  promethazine-dextromethorphan (PROMETHAZINE-DM) 6.25-15 MG/5ML syrup [id number] Yes Take 5 mLs by mouth 4 (four) times daily as needed for cough. [name] B, FNP Taking Active   QUEtiapine (SEROQUEL) 100 MG tablet [id number] Yes Take 150 mg by mouth at bedtime. [provider] Taking Active Pharmacy Records  Semaglutide,0.25 or 0.5MG /DOS, (OZEMPIC, 0.25 OR 0.5 MG/DOSE,) 2 MG/1.5ML SOPN [id number] Yes Inject 0.5 mg into the skin once a week. Leone Haven, MD Taking Active Pharmacy Records  simvastatin [facility]) 40 MG tablet [id number] Yes Take 40 mg by mouth daily. [provider] Taking Active   tamsulosin (FLOMAX) 0.4 MG CAPS capsule [id number] Yes TAKE ONE CAPSULE BY MOUTH DAILY AFTER Otilio Jefferson, Angela Adam, MD Taking Active   traZODone (DESYREL) 100 MG tablet [id number] Yes 150-200 mg at night as needed for sleep  Patient taking differently: 200 mg. 150-200 mg at night as needed for sleep   Norman Clay, MD Taking Active   varenicline (CHANTIX) 1 MG tablet [id number] Yes Take 1/2 tablet by mouth daily for 3 days, then 1/2 tablet by mouth twice daily for 4 days, then 1 tablet twice daily thereafter. Take with food. Leone Haven, MD Taking Active   vitamin C (ASCORBIC ACID) 500 MG tablet [id number] Yes Take 500 mg by mouth daily. [provider] Taking Active   Med List Note Landis Martins, RN 03/29/21 1357): MR  07-05-21 UDS 11/15/2020            Pertinent Labs:   Lab Results  Component Value Date   HGBA1C 6.8 (A) 01/11/2021   Lab Results  Component Value Date   CHOL 99 07/05/2020   HDL 43.20 07/05/2020   LDLCALC 57 09/25/2017   LDLDIRECT 31.0 07/05/2020   TRIG 226.0 (H) 07/05/2020   CHOLHDL 2 07/05/2020   Lab Results  Component Value Date   CREATININE 1.11 10/11/2020   BUN 32 (H) 10/11/2020   NA 137 10/11/2020   K 4.4 10/11/2020   CL 100 10/11/2020   CO2 28 10/11/2020    SDOH:  (Social Determinants of Health) assessments and interventions performed:  SDOH Interventions    Flowsheet Row Most Recent Value  SDOH Interventions   Financial Strain Interventions Other (Comment)  [manufacturer assistance]       CCM Care Plan  Review of patient past medical history, allergies, medications, health status, including review of consultants reports, laboratory and other test  data, was performed as part of comprehensive evaluation and provision of chronic care management services.   Care Plan : Medication Management  Updates made by De Hollingshead, RPH-CPP since 04/18/2021 12:00 AM     Problem: Diabetes, Depression      Long-Range Goal: Disease Progression Prevention   Start Date: 02/16/2020  Recent Progress: On track  Priority: Medium  Note:   Current Barriers:  Unable to independently afford treatment regimen  Pharmacist Clinical Goal(s):  Over the next 30 days, patient will verbalize ability to afford treatment regimen through collaboration with PharmD and provider.  Over the next 90 days, patient will maintain glycemic control as evidenced by A1c through collaboration with PharmD and provider.  Interventions: 1:1 collaboration with Leone Haven, MD regarding development and update of comprehensive plan of care as evidenced by provider attestation and co-signature Inter-disciplinary care team collaboration (see longitudinal plan of care) Comprehensive  medication review performed; medication list updated in electronic medical record  Diabetes: Controlled; current treatment: Farxiga 10 mg daily; Ozempic 0.5 mg weekly (max tolerated dose d/t GI upset/weight loss at 1 mg weekly) Approved for Ozempic and Farxiga assistance through 2022.  Hx metformin ER - significant diarrhea that resolved upon discontinuation Refill needed for [place] for patient assistance for 2023. Script sent today  Hypertension: Controlled per last clinic reading; current treatment: lisinopril/HCTZ 20/25 mg daily, metoprolol succinate 25 mg daily Previously recommended to continue current regimen at this time  Hyperlipidemia: Controlled; current treatment: rosuvastatin 40 mg daily, fenofibrate 145 mg daily, OTC omega 3 fatty acids 2 g BID Generic Lovaza and Vascepa too expensive. [place] funding closed.  Previously recommended to continue current regimen at this time  Depression/insomnia: Improved, sleep improved per patient report. Current regimen: bupropion XL 300 mg daily, trazodone 200 mg QPM. Follows w/ Dr. Modesta Messing and LCSW Psychiatry previously discussed reducing dose of quetiapine to reduce risk of EPS. Patient declined.  Previously recommended to continue current collaboration with psychiatry.   Chronic Pain: Improved per patient report; hydrocodone/APAP 10/325 mg up to TID, gabapentin 1200 mg TID; Follows w/ Dr. Holley Raring Previously recommended to continue current regimen at this time as prescribed  Neurologic Conditions (pseudo dementia, tremor, sleep disorder) Well managed per patient report; current regimen: memantine 10 mg BID, donepezil 10 mg daily; primidone 250 mg QAM, 150 mg QPM; divalproex 500 mg BID, quetiapine 150 mg QPM; Follows w/ Dr. Melrose Nakayama.  Previously recommended to continue current regimen at this time along with collaboration w/ Neurology  Chronic Diarrhea/Colitis, Barrett's: Controlled per patient report; current regimen:  budesonide 6 mg daily, Lomotil 1 tab QAM, hyoscyamine PRN added at last visit; follows w/ [facility]: omeprazole 20 mg daily Previously recommended to continue current regimen at this time  OAB/BPH: Uncontrolled, notes some continued incomplete emptying and increased urinary frequency; current treatment regimen: tamsulosin 0.4 mg daily; follows w/ Dr. Diamantina Providence Previously recommended to continue current regimen at this time and continue to communicate with urology if further treatment is needed.   Tobacco Abuse: 0.5-0.75 packs per day; reports it is just "too hard" and he is a "hopeless case" Previously approved for Tier Exception for varenicline, however, in coverage gap and will need to wait until January to fill Previous quit attempts: unsuccessful using patches, gum. Currently unsuccessful on bupropion. Patient aware and amenable to plan. Will follow up in January.  Patient Goals/Self-Care Activities Over the next 90 days, patient will:  - take medications as prescribed check glucose at least 3  times daily using CGM, document, and provide at future appointments collaborate with provider on medication access solutions engage in dietary modifications by reducing carbohydrate portion sizes Focus on tobacco cessation      Plan: Telephone follow up appointment with care management team member scheduled for:  8 weeks  Catie Darnelle Maffucci, PharmD, [place], CPP Clinical Pharmacist [employer] at [employer] [phone]

## 2021-04-18 NOTE — Patient Instructions (Signed)
Visit Information  Following are the goals we discussed today:  Patient Goals/Self-Care Activities Over the next 90 days, patient will:  - take medications as prescribed check glucose at least 3 times daily using CGM, document, and provide at future appointments collaborate with provider on medication access solutions engage in dietary modifications by reducing carbohydrate portion sizes Focus on tobacco cessation        Plan: Telephone follow up appointment with care management team member scheduled for:  8 weeks   Catie Darnelle Maffucci, PharmD, [place], CPP Clinical Pharmacist [place] at [facility] [phone]   Please call the care guide team at [phone] if you need to cancel or reschedule your appointment.   Patient verbalizes understanding of instructions provided today and agrees to view in [facility] [place].

## 2021-04-20 DIAGNOSIS — Z8616 Personal history of COVID-19: Secondary | ICD-10-CM | POA: Diagnosis not present

## 2021-04-20 DIAGNOSIS — Z09 Encounter for follow-up examination after completed treatment for conditions other than malignant neoplasm: Secondary | ICD-10-CM | POA: Diagnosis not present

## 2021-04-24 ENCOUNTER — Ambulatory Visit: Payer: PPO | Admitting: Student in an Organized Health Care Education/Training Program

## 2021-04-24 ENCOUNTER — Telehealth: Payer: Self-pay | Admitting: Pharmacy Technician

## 2021-04-24 DIAGNOSIS — Z596 Low income: Secondary | ICD-10-CM

## 2021-04-24 NOTE — Progress Notes (Signed)
[facility])                                            [place] Team    04/24/2021  James Hood [birth year] [id number]  Received provider portion(s) of prescription for Farxiga. Embedded PharmD escribed completed prescription into [facility].  James Hood, [place]  [phone]

## 2021-04-25 NOTE — Progress Notes (Addendum)
[place] MD/PA/NP OP Progress Note  05/01/2021 9:26 AM James Hood  MRN:  [id number]  Chief Complaint:  Chief Complaint   Follow-up    HPI:  This is a follow-up appointment for depression and insomnia.  He states that he has been doing very well.  Although he is concerned about finances and the politics, he tries to medical.  He goes to [employer] meeting few times a week.  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  He gets pleasure from helping people.  He had COVID on Thanksgiving. He denies any lingering symptoms from COVID. The person who helps his yard brought them plates of food.  He reports great relationship with his wife.  He reports frustration against his stepson, who has not contacted his wife for the past 2 years.  He also talks about his daughter, who is now celebrating [place] holiday instead of Christmas.  He sleeps well since taking a higher dose of trazodone.  He denies any dizziness of fall except that he fell while carrying a heavy box.  He has good energy.  He has good appetite.  He enjoys reading a book.  He denies feeling depressed or anhedonia.  He denies SI.  He denies anxiety.  He feels comfortable to stay on the current medication regimen.   Daily routine: wakes up at 7 am, watch TV, goes out to [place] meeting in person (35 years of membership), sleeps 8-9 pm,  Exercise: (stays in the house) Employment: retired at 80 yo. He used to work as a Manufacturing systems engineer Household: wife Marital status: 65 years with his second wife, divorced before Number of children: 1 child, 2 grandchildren, 3 step children Education: MS in Insurance underwriter Readings from Last 3 Encounters:  05/01/21 167 lb (75.8 kg)  04/11/21 165 lb (74.8 kg)  03/29/21 165 lb (74.8 kg)     Visit Diagnosis:    ICD-10-CM   1. MDD (major depressive disorder), recurrent, in full remission ([place])  F33.42     2. Insomnia, unspecified type  G47.00       Past Psychiatric History: Please  see initial evaluation for full details. I have reviewed the history. No updates at this time.     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   Colon polyps    Dementia ([place])    Depression    Diabetes mellitus type 2 in nonobese (HCC)    GERD (gastroesophageal reflux disease)    BARRETTS ESOPHAGUS RESOLVED PER PATIENT   Headache    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[place])    History of hiatal hernia    Hypercholesteremia    Hypertension    Hyperthyroidism    Neuropathic pain    Primary localized osteoarthritis of right knee    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   Stomach ulcer    Tremor, essential    Wound infection complicating hardware ([place]) 04/02/2018    Past Surgical History:  Procedure Laterality Date   ANKLE ARTHROSCOPY Left 09/29/2019   Procedure: LEFT ANKLE ARTHROSCOPY, DEBRIDEMENT;  Surgeon: Newt Minion, MD;  Location: [place];  Service: Orthopedics;  Laterality: Left;   BACK SURGERY     COLONOSCOPY WITH PROPOFOL N/A 09/14/2016   Procedure: COLONOSCOPY  WITH PROPOFOL;  Surgeon: Manya Silvas, MD;  Location: [facility];  Service: Endoscopy;  Laterality: N/A;   esophageal stretch     ESOPHAGOGASTRODUODENOSCOPY (EGD) WITH PROPOFOL N/A 12/13/2020   Procedure: ESOPHAGOGASTRODUODENOSCOPY (EGD) WITH PROPOFOL;  Surgeon: Lesly Rubenstein, MD;  Location: [facility];  Service: Endoscopy;  Laterality: N/A;  IDDM   JOINT REPLACEMENT Right 2016   knee   LUMBAR LAMINECTOMY/DECOMPRESSION MICRODISCECTOMY Left 02/03/2016   Procedure: LEFT L5-S1 DISKECTOMY;  Surgeon: Leeroy Cha, MD;  Location: [facility];  Service: Neurosurgery;  Laterality: Left;  LEFT L5-S1 DISKECTOMY   PULSE GENERATOR IMPLANT N/A 08/21/2017   Procedure: UNILATERAL PULSE GENERATOR IMPLANT;  Surgeon:  Meade Maw, MD;  Location: [facility];  Service: Neurosurgery;  Laterality: N/A;   PULSE GENERATOR IMPLANT Right 04/02/2018   Procedure: REMOVAL OF PULSE GENERATOR IMPLANT AND LEADS;  Surgeon: Meade Maw, MD;  Location: [facility];  Service: Neurosurgery;  Laterality: Right;   TONSILLECTOMY     TOTAL KNEE ARTHROPLASTY Right 11/15/2014   Procedure: TOTAL KNEE ARTHROPLASTY;  Surgeon: Elsie Saas, MD;  Location: [facility];  Service: Orthopedics;  Laterality: Right;    Family Psychiatric History: Please see initial evaluation for full details. I have reviewed the history. No updates at this time.     Family History:  Family History  Problem Relation Age of Onset   Heart attack Mother    Heart disease Mother    Hypertension Father    Depression Father    Kidney cancer Neg Hx    Prostate cancer Neg Hx     Social History:  Social History   Socioeconomic History   Marital status: Married    Spouse name: Not on file   Number of children: Not on file   Years of education: Not on file   Highest education level: Not on file  Occupational History   Not on file  Tobacco Use   Smoking status: Every Day    Packs/day: 0.50    Years: 60.00    Pack years: 30.00    Types: Cigarettes   Smokeless tobacco: Never   Tobacco comments:    Last one at 0400  Vaping Use   Vaping Use: Former  Substance and Sexual Activity   Alcohol use: Not Currently    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   Drug use: Never   Sexual activity: Not Currently  Other Topics Concern   Not on file  Social History Narrative   Not on file   Social Determinants of Health   Financial Resource Strain: Medium Risk   Difficulty of Paying Living Expenses: Somewhat hard  Food Insecurity: Not on file  Transportation Needs: Not on file  Physical Activity: Not on file  Stress: Not on file  Social Connections: Not on file    Allergies: No Known Allergies  Metabolic Disorder Labs: Lab Results   Component Value Date   HGBA1C 6.8 (A) 01/11/2021   MPG 131 01/25/2016   MPG 166 11/05/2014   No results found for: PROLACTIN Lab Results  Component Value Date   CHOL 99 07/05/2020   TRIG 226.0 (H) 07/05/2020   HDL 43.20 07/05/2020   CHOLHDL 2 07/05/2020   VLDL 45.2 (H) 07/05/2020   LDLCALC 57 09/25/2017   Lab Results  Component Value Date   TSH 0.97 10/11/2020   TSH 0.346 (L) 05/31/2020    Therapeutic Level Labs: No results found for: LITHIUM No results found for: VALPROATE No components found for:  CBMZ  Current Medications: Current Outpatient Medications  Medication Sig Dispense Refill   aspirin EC 81 MG tablet Take 81 mg by mouth daily.     budesonide (ENTOCORT EC) 3 MG 24 hr capsule Take 6 mg by mouth daily.     Continuous Blood Gluc Sensor (FREESTYLE LIBRE 14 DAY SENSOR) MISC APPLY ONE SENSOR EVERY 14 DAYS TO MONITOR BLOOD GLUCOSE LEVELS. REMOVE OLD SENSOR NEFORE APPLYING NEW SENSOR 2 each 5   dapagliflozin propanediol (FARXIGA) 10 MG TABS tablet Take 1 tablet (10 mg total) by mouth daily. 90 tablet 3   divalproex (DEPAKOTE ER) 500 MG 24 hr tablet Take 1 tablet by mouth 2 (two) times daily.     donepezil (ARICEPT) 10 MG tablet Take 10 mg by mouth at bedtime.     doxycycline (VIBRA-TABS) 100 MG tablet Take 1 tablet (100 mg total) by mouth 2 (two) times daily. 20 tablet 0   fenofibrate (TRICOR) 145 MG tablet TAKE ONE TABLET BY MOUTH DAILY 90 tablet 1   ferrous sulfate 325 (65 FE) MG EC tablet Take 325 mg by mouth daily.     gabapentin (NEURONTIN) 600 MG tablet TAKE TWO TABLETS BY MOUTH THREE TIMES A DAY 180 tablet 1   HYDROcodone-acetaminophen (NORCO) 10-325 MG tablet Take 1 tablet by mouth every 4 (four) hours as needed for severe pain. Must last 30 days. 180 tablet 0   [START ON 05/06/2021] HYDROcodone-acetaminophen (NORCO) 10-325 MG tablet Take 1 tablet by mouth every 4 (four) hours as needed for severe pain. Must last 30 days. 180 tablet 0   [START ON 06/05/2021]  HYDROcodone-acetaminophen (NORCO) 10-325 MG tablet Take 1 tablet by mouth every 4 (four) hours as needed for severe pain. Must last 30 days. 180 tablet 0   Hyoscyamine Sulfate SL 0.125 MG SUBL Place 1 tablet under the tongue daily as needed.     ipratropium (ATROVENT) 0.03 % nasal spray Place 2 sprays into both nostrils every 12 (twelve) hours. 30 mL 12   lisinopril-hydrochlorothiazide (ZESTORETIC) 20-25 MG tablet Take 1 tablet by mouth daily. 90 tablet 1   memantine (NAMENDA) 10 MG tablet Take 1 tablet by mouth daily.     metoprolol succinate (TOPROL-XL) 25 MG 24 hr tablet Take 25 mg by mouth daily.     Multiple Vitamins-Minerals (CENTRUM SILVER PO) Take 1 tablet by mouth daily.     omega-3 acid ethyl esters (LOVAZA) 1 g capsule Take 2 capsules (2 g total) by mouth 2 (two) times daily. 360 capsule 3   omeprazole (PRILOSEC) 20 MG capsule Take 20 mg by mouth daily.     primidone (MYSOLINE) 50 MG tablet Take 150-250 mg by mouth 2 (two) times daily. Take 250 mg by mouth in the morning & take 150 mg at night.     promethazine-dextromethorphan (PROMETHAZINE-DM) 6.25-15 MG/5ML syrup Take 5 mLs by mouth 4 (four) times daily as needed for cough. 118 mL 0   QUEtiapine (SEROQUEL) 100 MG tablet Take 150 mg by mouth at bedtime.     Semaglutide,0.25 or 0.5MG /DOS, (OZEMPIC, 0.25 OR 0.5 MG/DOSE,) 2 MG/1.5ML SOPN Inject 0.5 mg into the skin once a week. 1.5 mL 3   simvastatin (ZOCOR) 40 MG tablet Take 40 mg by mouth daily.     tamsulosin (FLOMAX) 0.4 MG CAPS capsule TAKE ONE CAPSULE BY MOUTH DAILY AFTER DINNER 90 capsule 0   varenicline (CHANTIX) 1 MG tablet Take 1/2 tablet by mouth daily for 3 days, then 1/2 tablet by mouth twice daily for 4 days,  then 1 tablet twice daily thereafter. Take with food. 60 tablet 5   vitamin C (ASCORBIC ACID) 500 MG tablet Take 500 mg by mouth daily.     [START ON 05/24/2021] buPROPion (WELLBUTRIN XL) 300 MG 24 hr tablet Take 1 tablet (300 mg total) by mouth daily. 90 tablet 0    [START ON 05/08/2021] traZODone (DESYREL) 100 MG tablet Take 2 tablets (200 mg total) by mouth at bedtime as needed for sleep. 180 tablet 0   No current facility-administered medications for this visit.     Musculoskeletal: Strength & Muscle Tone:  normal Gait & Station: unsteady due to right knee pain Patient leans: N/A  Psychiatric Specialty Exam: Review of Systems  Psychiatric/Behavioral: Negative.    All other systems reviewed and are negative.  Blood pressure 117/76, pulse 81, temperature (!) 96.7 F (35.9 C), temperature source Temporal, weight 167 lb (75.8 kg).Body mass index is 23.96 kg/m.  General Appearance: Fairly Groomed  Eye Contact:  Good  Speech:  Clear and Coherent  Volume:  Normal  Mood:   good  Affect:  Appropriate, Congruent, and Full Range  Thought Process:  Coherent  Orientation:  Full (Time, Place, and Person)  Thought Content: Logical   Suicidal Thoughts:  No  Homicidal Thoughts:  No  Memory:  Immediate;   Good  Judgement:  Good  Insight:  Good  Psychomotor Activity:  Normal, no rigidity, no resting tremors  Concentration:  Concentration: Good and Attention Span: Good  Recall:  Good  Fund of Knowledge: Good  Language: Good  Akathisia:  No  Handed:  Right  AIMS (if indicated): 0  Assets:  Communication Skills Desire for Improvement  ADL's:  Intact  Cognition: WNL  Sleep:  Good   Screenings: GAD-7    Flowsheet Row Counselor from 03/22/2021 in [facility] from 12/07/2020 in [place] from 11/02/2020 in [facility] from 09/07/2020 in [facility] Visit from 09/09/2015 in [facility]  Total GAD-7 Score 0 3 6 5 6       Mini-Mental    Flowsheet Row Clinical Support from 11/15/2017 in [facility] from 11/14/2016 in [facility]   Total Score (max 30 points ) 30 30      PHQ2-9    [place] Visit from 05/01/2021 in [place] Video Visit from 04/11/2021 in [facility] Office Visit from 03/29/2021 in [place] Counselor from 03/22/2021 in [place] Video Visit from 02/28/2021 in [place]  PHQ-2 Total Score 0 0 0 0 0  PHQ-9 Total Score -- 0 -- -- --      [place] Office Visit from 05/01/2021 in [place] Office Visit from 01/30/2021 in [place] from 01/18/2021 in [place] No Risk No Risk No Risk        Assessment and Plan:  James Hood is a 80 y.o. year old male with a history of pseudo dementia, sleep disorder, tremor, history of QTc prolongation, who presents for follow up appointment for below.   1. MDD (major depressive disorder), recurrent, in full remission ([place]) He denies significant mood symptoms since the last visit.  Psychosocial stressors includes current politics, conflict with his stepson, his daughter, and demoralization due to aging/pain.  He continues to be  actively involved in [place], and stays connected with his family including his wife.  Will continue current dose of bupropion as maintenance treatment for depression.  Noted that he has been on quetiapine, prescribed by his neurologist, and he prefers to stay on the current dose.   2. Insomnia, unspecified type Significant benefit from higher dose of trazodone without drowsiness.  Will continue current dose to target insomnia.   # History of cognitive impairment Follow-up by neurologist, and has been on memantine and donepezil as below.   This clinician has discussed the side effect associated with medication prescribed during this encounter. Please  refer to notes in the previous encounters for more details.      Plan Continue bupropion XR 300 mg daily   Continue Trazodone 200 mg at night as needed for insomnia Next appointment: 3/13 at 9 Am for 30 mins, in person - on quetiapine 125 mg at night - qtc 425 msec with iRBBB 06/08/2020 (550 msec on 1/10) - on Donepezil 10 mg qhs, memantine 10 mg 10 mg twice a day - on Gabapentin 600 mg TID , primidone for tremor - on Depakote 500 mg BID for reportedly sleep disorder   The patient demonstrates the following risk factors for suicide: Chronic risk factors for suicide include: psychiatric disorder of depression. Acute risk factors for suicide include: N/A. Protective factors for this patient include: positive social support, responsibility to others (children, family), coping skills and hope for the future. Considering these factors, the overall suicide risk at this point appears to be low. Patient is appropriate for outpatient follow up.  Norman Clay, MD 05/01/2021, 9:26 AM

## 2021-04-26 ENCOUNTER — Other Ambulatory Visit: Payer: Self-pay

## 2021-04-26 ENCOUNTER — Ambulatory Visit (INDEPENDENT_AMBULATORY_CARE_PROVIDER_SITE_OTHER): Payer: PPO | Admitting: Licensed Clinical Social Worker

## 2021-04-26 DIAGNOSIS — F3342 Major depressive disorder, recurrent, in full remission: Secondary | ICD-10-CM

## 2021-04-26 NOTE — Plan of Care (Signed)
  Problem: Decrease depressive symptoms and improve levels of effective functioning Goal: LTG: Reduce frequency, intensity, and duration of depression symptoms as evidenced by: pt self report Outcome: Progressing Goal: STG: @PREFFIRSTNAME @ will participate in at least 80% of scheduled individual psychotherapy sessions Outcome: Progressing

## 2021-04-26 NOTE — Progress Notes (Signed)
   THERAPIST PROGRESS NOTE  Session Time: 8-9am  Participation Level: Active  Behavioral Response: NeatAlertAnxious  Type of Therapy: Individual Therapy  Treatment Goals addressed:  Goal: LTG: Reduce frequency, intensity, and duration of depression symptoms as evidenced by: pt self report Outcome: Progressing  Goal: STG: @PREFFIRSTNAME @ will participate in at least 80% of scheduled individual psychotherapy sessions Outcome: Progressing  Interventions:  Intervention: REVIEW PLEASE SKILLS (TREAT PHYSICAL ILLNESS, BALANCE EATING, AVOID MOOD-ALTERING SUBSTANCES, BALANCE SLEEP AND GET EXERCISE) WITH James Hood"  Intervention: Assess emotional status and coping mechanisms   Summary: James Hood is a 80 y.o. male who presents with improving symptoms related to depression diagnosis. Pt reports current mood is stable and that he is managing stress and anxiety well. Pt reports that he is maintaining sober behavior and is currently sponsoring individuals through [place].    Allowed pt to explore and express thoughts and feelings associated with recent life situations and external stressors. Processed through triggered feelings surrounding the death of pts parents--and overall psychological impact. Discussed relationships with immediate family members and extended family members. Pt recently recovering from Covid (both him and wife).  Reviewed current coping mechanisms for managing depression and anxiety. Discussed healthy communication patterns in relationships.  Continued recommendations are as follows: self care behaviors, positive social engagements, focusing on overall work/home/life balance, and focusing on positive physical and emotional wellness.  .   Suicidal/Homicidal: No  Therapist Response: Pt is continuing to apply interventions learned in session into daily life situations. Pt is currently on track to meet goals utilizing interventions mentioned above. Personal growth and  progress noted. Treatment to continue as indicated.   Plan: Return again in 4 weeks.  Diagnosis: Axis I: MDD, remission    Axis II: No diagnosis    Rachel Bo Shermon Bozzi, LCSW 04/26/2021

## 2021-05-01 ENCOUNTER — Other Ambulatory Visit: Payer: Self-pay

## 2021-05-01 ENCOUNTER — Ambulatory Visit (INDEPENDENT_AMBULATORY_CARE_PROVIDER_SITE_OTHER): Payer: PPO | Admitting: Psychiatry

## 2021-05-01 ENCOUNTER — Encounter: Payer: Self-pay | Admitting: Psychiatry

## 2021-05-01 VITALS — BP 117/76 | HR 81 | Temp 96.7°F | Wt 167.0 lb

## 2021-05-01 DIAGNOSIS — G4700 Insomnia, unspecified: Secondary | ICD-10-CM | POA: Diagnosis not present

## 2021-05-01 DIAGNOSIS — F3342 Major depressive disorder, recurrent, in full remission: Secondary | ICD-10-CM

## 2021-05-01 MED ORDER — TRAZODONE HCL 100 MG PO TABS
200.0000 mg | ORAL_TABLET | Freq: Every evening | ORAL | 0 refills | Status: DC | PRN
Start: 2021-05-08 — End: 2021-07-31

## 2021-05-01 MED ORDER — BUPROPION HCL ER (XL) 300 MG PO TB24: 300.0000 mg | ORAL_TABLET | Freq: Every day | ORAL | 0 refills | Status: DC

## 2021-05-01 NOTE — Patient Instructions (Signed)
Continue bupropion XR 300 mg daily   Continue Trazodone 200 mg at night as needed for insomnia Next appointment: 3/13 at 9 AM, in person

## 2021-05-10 ENCOUNTER — Other Ambulatory Visit: Payer: Self-pay

## 2021-05-10 ENCOUNTER — Ambulatory Visit (INDEPENDENT_AMBULATORY_CARE_PROVIDER_SITE_OTHER): Payer: PPO | Admitting: Family Medicine

## 2021-05-10 ENCOUNTER — Encounter: Payer: Self-pay | Admitting: Family Medicine

## 2021-05-10 ENCOUNTER — Telehealth: Payer: Self-pay | Admitting: Family Medicine

## 2021-05-10 DIAGNOSIS — N400 Enlarged prostate without lower urinary tract symptoms: Secondary | ICD-10-CM | POA: Diagnosis not present

## 2021-05-10 DIAGNOSIS — E1142 Type 2 diabetes mellitus with diabetic polyneuropathy: Secondary | ICD-10-CM | POA: Diagnosis not present

## 2021-05-10 DIAGNOSIS — Z720 Tobacco use: Secondary | ICD-10-CM | POA: Diagnosis not present

## 2021-05-10 DIAGNOSIS — E785 Hyperlipidemia, unspecified: Secondary | ICD-10-CM | POA: Diagnosis not present

## 2021-05-10 DIAGNOSIS — I10 Essential (primary) hypertension: Secondary | ICD-10-CM

## 2021-05-10 LAB — BASIC METABOLIC PANEL
BUN: 26 mg/dL — ABNORMAL HIGH (ref 6–23)
CO2: 29 mEq/L (ref 19–32)
Calcium: 10.4 mg/dL (ref 8.4–10.5)
Chloride: 97 mEq/L (ref 96–112)
Creatinine, Ser: 1.17 mg/dL (ref 0.40–1.50)
GFR: 58.56 mL/min — ABNORMAL LOW (ref >60.00)
Glucose, Bld: 132 mg/dL — ABNORMAL HIGH (ref 70–99)
Potassium: 4.4 mEq/L (ref 3.5–5.1)
Sodium: 133 mEq/L — ABNORMAL LOW (ref 135–145)

## 2021-05-10 LAB — HEMOGLOBIN A1C: Hgb A1c MFr Bld: 7.5 % — ABNORMAL HIGH (ref 4.6–6.5)

## 2021-05-10 MED ORDER — METFORMIN HCL 500 MG PO TABS: 500.0000 mg | ORAL_TABLET | Freq: Two times a day (BID) | ORAL | 3 refills | Status: DC

## 2021-05-10 MED ORDER — TAMSULOSIN HCL 0.4 MG PO CAPS: 0.8000 mg | ORAL_CAPSULE | Freq: Every day | ORAL | 1 refills | Status: DC

## 2021-05-10 NOTE — Telephone Encounter (Signed)
Patient called in to let Dr Caryl Bis know that he is not currently taking Metformin.

## 2021-05-10 NOTE — Assessment & Plan Note (Signed)
Well-controlled.  He will continue lisinopril/HCTZ 1 tablet daily and metoprolol 25 mg daily.  Check labs.

## 2021-05-10 NOTE — Telephone Encounter (Signed)
I called and spoke with the patient and informed him that the provider wants him to restart the metformin and it was sent to the pharmacy.  He understood.  Kiarah Eckstein,cma

## 2021-05-10 NOTE — Assessment & Plan Note (Signed)
The patient continues to have BPH symptoms.  We will try increasing his Flomax to 0.8 mg daily.  He will monitor for lightheadedness and if that occurs he will let us know.

## 2021-05-10 NOTE — Assessment & Plan Note (Signed)
We will check an A1c.  He will continue Farxiga 10 mg once daily and Ozempic 0.5 mg weekly.  The patient thought he was still on metformin though its not on his medication list.  He will check at home.  He was encouraged to check his CGM every 8 hours.

## 2021-05-10 NOTE — Telephone Encounter (Signed)
Noted.  Lets restart his metformin.  I sent that to his pharmacy.  His A1c was uncontrolled at 7.5.  His kidney function is generally stable.  His sodium is very minimally low.  We will continue to periodically monitor that.

## 2021-05-10 NOTE — Assessment & Plan Note (Addendum)
Encouraged smoking cessation. The patient is unsure about quitting though does note he would like to try Chantix in the new year when it could be covered by his insurance.  I will send a message to our clinical pharmacist to follow-up on this with him.

## 2021-05-10 NOTE — Assessment & Plan Note (Signed)
Continue simvastatin 40 mg once daily.

## 2021-05-10 NOTE — Progress Notes (Signed)
Tommi Rumps, MD Phone: [phone]  James Hood is a 80 y.o. male who presents today for f/u.  HYPERTENSION Disease Monitoring: Blood pressure range-not checking Chest pain- no      Dyspnea- no Medications: Compliance- taking lisinopril/HCTZ Lightheadedness- no   Edema- no  DIABETES Disease Monitoring: Blood Sugar ranges-84% in range, 16% high, checking with CGM 2-4x/day, CGM data downloaded and reviewed. Polyuria/phagia/dipsia- no      Optho- UTD Medications: Compliance- taking farxiga, ozempic Hypoglycemic symptoms- no  HYPERLIPIDEMIA Disease Monitoring: See symptoms for Hypertension Medications: Compliance- taking simvastatin Right upper quadrant pain- no  Muscle aches- no  BPH: Strain- no Flow- normal Frequency- yes Urgency- yes Emptying bladder- no Medication- flomax  Tobacco abuse: considering quitting if we can get chantix approved in the new year for him.    Social History   Tobacco Use  Smoking Status Every Day   Packs/day: 0.50   Years: 60.00   Pack years: 30.00   Types: Cigarettes  Smokeless Tobacco Never    Current Outpatient Medications on File Prior to Visit  Medication Sig Dispense Refill   aspirin EC 81 MG tablet Take 81 mg by mouth daily.     budesonide (ENTOCORT EC) 3 MG 24 hr capsule Take 6 mg by mouth daily.     [START ON 05/24/2021] buPROPion (WELLBUTRIN XL) 300 MG 24 hr tablet Take 1 tablet (300 mg total) by mouth daily. 90 tablet 0   Continuous Blood Gluc Sensor (FREESTYLE LIBRE 14 DAY SENSOR) MISC APPLY ONE SENSOR EVERY 14 DAYS TO MONITOR BLOOD GLUCOSE LEVELS. REMOVE OLD SENSOR NEFORE APPLYING NEW SENSOR 2 each 5   dapagliflozin propanediol (FARXIGA) 10 MG TABS tablet Take 1 tablet (10 mg total) by mouth daily. 90 tablet 3   divalproex (DEPAKOTE ER) 500 MG 24 hr tablet Take 1 tablet by mouth 2 (two) times daily.     donepezil (ARICEPT) 10 MG tablet Take 10 mg by mouth at bedtime.     doxycycline (VIBRA-TABS) 100 MG tablet Take 1  tablet (100 mg total) by mouth 2 (two) times daily. 20 tablet 0   fenofibrate (TRICOR) 145 MG tablet TAKE ONE TABLET BY MOUTH DAILY 90 tablet 1   ferrous sulfate 325 (65 FE) MG EC tablet Take 325 mg by mouth daily.     gabapentin (NEURONTIN) 600 MG tablet TAKE TWO TABLETS BY MOUTH THREE TIMES A DAY 180 tablet 1   [START ON 06/05/2021] HYDROcodone-acetaminophen (NORCO) 10-325 MG tablet Take 1 tablet by mouth every 4 (four) hours as needed for severe pain. Must last 30 days. 180 tablet 0   Hyoscyamine Sulfate SL 0.125 MG SUBL Place 1 tablet under the tongue daily as needed.     lisinopril-hydrochlorothiazide (ZESTORETIC) 20-25 MG tablet Take 1 tablet by mouth daily. 90 tablet 1   memantine (NAMENDA) 10 MG tablet Take 1 tablet by mouth daily.     metoprolol succinate (TOPROL-XL) 25 MG 24 hr tablet Take 25 mg by mouth daily.     Multiple Vitamins-Minerals (CENTRUM SILVER PO) Take 1 tablet by mouth daily.     omega-3 acid ethyl esters (LOVAZA) 1 g capsule Take 2 capsules (2 g total) by mouth 2 (two) times daily. 360 capsule 3   omeprazole (PRILOSEC) 20 MG capsule Take 20 mg by mouth daily.     primidone (MYSOLINE) 50 MG tablet Take 150-250 mg by mouth 2 (two) times daily. Take 250 mg by mouth in the morning & take 150 mg at night.  QUEtiapine (SEROQUEL) 100 MG tablet Take 150 mg by mouth at bedtime.     Semaglutide,0.25 or 0.5MG /DOS, (OZEMPIC, 0.25 OR 0.5 MG/DOSE,) 2 MG/1.5ML SOPN Inject 0.5 mg into the skin once a week. 1.5 mL 3   simvastatin (ZOCOR) 40 MG tablet Take 40 mg by mouth daily.     traZODone (DESYREL) 100 MG tablet Take 2 tablets (200 mg total) by mouth at bedtime as needed for sleep. 180 tablet 0   vitamin C (ASCORBIC ACID) 500 MG tablet Take 500 mg by mouth daily.     HYDROcodone-acetaminophen (NORCO) 10-325 MG tablet Take 1 tablet by mouth every 4 (four) hours as needed for severe pain. Must last 30 days. (Patient not taking: Reported on 05/10/2021) 180 tablet 0   ipratropium  (ATROVENT) 0.03 % nasal spray Place 2 sprays into both nostrils every 12 (twelve) hours. (Patient not taking: Reported on 05/10/2021) 30 mL 12   promethazine-dextromethorphan (PROMETHAZINE-DM) 6.25-15 MG/5ML syrup Take 5 mLs by mouth 4 (four) times daily as needed for cough. (Patient not taking: Reported on 05/10/2021) 118 mL 0   varenicline (CHANTIX) 1 MG tablet Take 1/2 tablet by mouth daily for 3 days, then 1/2 tablet by mouth twice daily for 4 days, then 1 tablet twice daily thereafter. Take with food. (Patient not taking: Reported on 05/10/2021) 60 tablet 5   No current facility-administered medications on file prior to visit.     ROS see history of present illness  Objective  Physical Exam Vitals:   05/10/21 0905  BP: 100/60  Pulse: 87  Temp: 97.8 F (36.6 C)  SpO2: 98%    BP Readings from Last 3 Encounters:  05/10/21 100/60  04/11/21 120/80  03/29/21 104/70   Wt Readings from Last 3 Encounters:  05/10/21 166 lb 12.8 oz (75.7 kg)  04/11/21 165 lb (74.8 kg)  03/29/21 165 lb (74.8 kg)    Physical Exam Constitutional:      General: He is not in acute distress.    Appearance: He is not diaphoretic.  Cardiovascular:     Rate and Rhythm: Normal rate and regular rhythm.     Heart sounds: Normal heart sounds.  Pulmonary:     Effort: Pulmonary effort is normal.     Breath sounds: Normal breath sounds.  Skin:    General: Skin is warm and dry.  Neurological:     Mental Status: He is alert.     Assessment/Plan: Please see individual problem list.  Problem List Items Addressed This Visit     HLD (hyperlipidemia) (Chronic)    Continue simvastatin 40 mg once daily.      Benign prostatic hyperplasia    The patient continues to have BPH symptoms.  We will try increasing his Flomax to 0.8 mg daily.  He will monitor for lightheadedness and if that occurs he will let us know.      Relevant Medications   tamsulosin (FLOMAX) 0.4 MG CAPS capsule   Hypertension     Well-controlled.  He will continue lisinopril/HCTZ 1 tablet daily and metoprolol 25 mg daily.  Check labs.      Relevant Orders   Basic Metabolic Panel (BMET)   Tobacco abuse    The patient is unsure about quitting though does note he would like to try Chantix in the new year when it could be covered by his insurance.  I will send a message to our clinical pharmacist to follow-up on this with him.      Relevant Orders  Ambulatory Referral for Lung Cancer Scre   Type 2 diabetes mellitus ([place])    We will check an A1c.  He will continue Farxiga 10 mg once daily and Ozempic 0.5 mg weekly.  The patient thought he was still on metformin though its not on his medication list.  He will check at home.  He was encouraged to check his CGM every 8 hours.      Relevant Orders   HgB A1c     Health Maintenance: requesting ophthalmology records.   Return in about 3 months (around 08/08/2021) for CPE.  This visit occurred during the SARS-CoV-2 public health emergency.  Safety protocols were in place, including screening questions prior to the visit, additional usage of staff PPE, and extensive cleaning of exam room while observing appropriate contact time as indicated for disinfecting solutions.    Tommi Rumps, MD [place]

## 2021-05-10 NOTE — Patient Instructions (Signed)
Nice to see you. We will get lab work today. Somebody will contact you to schedule lung cancer screening. We are increasing your Flomax to 0.8 mg daily.  Please monitor for lightheadedness with this increase.  If this occurs please let me know. Please check to see if you are taking metformin and glimepiride at home.

## 2021-05-10 NOTE — Addendum Note (Signed)
Addended by: Leone Haven on: 05/10/2021 03:59 PM   Modules accepted: Orders

## 2021-05-11 ENCOUNTER — Ambulatory Visit: Payer: PPO | Admitting: Podiatry

## 2021-05-23 ENCOUNTER — Ambulatory Visit: Payer: PPO | Admitting: Pharmacist

## 2021-05-23 DIAGNOSIS — E1142 Type 2 diabetes mellitus with diabetic polyneuropathy: Secondary | ICD-10-CM

## 2021-05-23 DIAGNOSIS — I10 Essential (primary) hypertension: Secondary | ICD-10-CM

## 2021-05-23 DIAGNOSIS — Z720 Tobacco use: Secondary | ICD-10-CM

## 2021-05-23 NOTE — Patient Instructions (Signed)
Visit Information  Following are the goals we discussed today:  Patient Goals/Self-Care Activities Over the next 90 days, patient will:  - take medications as prescribed check glucose at least 3 times daily using CGM, document, and provide at future appointments collaborate with provider on medication access solutions engage in dietary modifications by reducing carbohydrate portion sizes Focus on tobacco cessation        Plan: Telephone follow up appointment with care management team member scheduled for:  pending medication access   Catie Darnelle Maffucci, PharmD, [place], CPP Clinical Pharmacist [place] at [facility] [phone]       Please call the care guide team at [phone] if you need to cancel or reschedule your appointment.   Patient verbalizes understanding of instructions provided today and agrees to view in [place].

## 2021-05-23 NOTE — Chronic Care Management (AMB) (Signed)
Chronic Care Management CCM Pharmacy Note  05/23/2021 Name:  James Hood MRN:  [id number] DOB:  February 20, 1941  Summary: - Patient interested in starting varenicline therapy. New tier exception required  Recommendations/Changes made from today's visit: - Completed, submitted today. Will follow for result  Subjective: James Hood is an 81 y.o. year old male who is a primary patient of Caryl Bis, Angela Adam, MD.  The CCM team was consulted for assistance with disease management and care coordination needs.    Engaged with patient by telephone for follow up visit for pharmacy case management and/or care coordination services.   Objective:  Medications Reviewed Today     Reviewed by Gordy Councilman, CMA (Certified Medical Assistant) on 05/10/21 at [place] List Status: <None>   Medication Order Taking? Sig Documenting Provider Last Dose Status Informant  aspirin EC 81 MG tablet [id number] Yes Take 81 mg by mouth daily. [provider] Taking Active            Med Note Vanessa [place], SUSAN   Tue Apr 15, 2018  8:47 AM)    budesonide (ENTOCORT EC) 3 MG 24 hr capsule [id number] Yes Take 6 mg by mouth daily. [provider] Taking Active   buPROPion (WELLBUTRIN XL) 300 MG 24 hr tablet [id number] Yes Take 1 tablet (300 mg total) by mouth daily. Norman Clay, MD Taking Active   Continuous Blood Gluc Sensor (FREESTYLE LIBRE 14 DAY SENSOR) MISC [id number] Yes APPLY ONE SENSOR EVERY 14 DAYS TO MONITOR BLOOD GLUCOSE LEVELS. [place] Leone Haven, MD Taking Active   dapagliflozin propanediol (FARXIGA) 10 MG TABS tablet [id number] Yes Take 1 tablet (10 mg total) by mouth daily. Leone Haven, MD Taking Active   divalproex (DEPAKOTE ER) 500 MG 24 hr tablet [id number] Yes Take 1 tablet by mouth 2 (two) times daily. [provider] Taking Active   donepezil (ARICEPT) 10 MG tablet [id number] Yes Take 10 mg by mouth at bedtime. [provider] Taking Active Self  doxycycline (VIBRA-TABS) 100 MG tablet [id number] Yes Take 1 tablet (100 mg total) by mouth 2 (two) times daily. [name] B, FNP Taking Active   fenofibrate (TRICOR) 145 MG tablet [id number] Yes TAKE ONE TABLET BY MOUTH DAILY Leone Haven, MD Taking Active   ferrous sulfate 325 (65 FE) MG EC tablet [id number] Yes Take 325 mg by mouth daily. [provider] Taking Active   gabapentin (NEURONTIN) 600 MG tablet [id number] Yes TAKE TWO TABLETS BY MOUTH THREE TIMES A DAY Leone Haven, MD Taking Active   HYDROcodone-acetaminophen [facility]) 10-325 MG tablet [id number] No Take 1 tablet by mouth every 4 (four) hours as needed for severe pain. Must last 30 days.  Patient not taking: Reported on 05/10/2021   [name], MD Not Taking Active   HYDROcodone-acetaminophen [facility]) 10-325 MG tablet [id number] Yes Take 1 tablet by mouth every 4 (four) hours as needed for severe pain. Must last 30 days. [name], MD Taking Active   Hyoscyamine Sulfate SL 0.125 MG SUBL [id number] Yes Place 1 tablet under the tongue daily as needed. [provider] Taking Active   ipratropium (ATROVENT) 0.03 % nasal spray [id number] No Place 2 sprays into both nostrils every 12 (twelve) hours.  Patient not taking: Reported on 05/10/2021   Leone Haven, MD Not Taking Active   lisinopril-hydrochlorothiazide (ZESTORETIC) 20-25 MG tablet [id number] Yes Take 1 tablet by mouth daily. Leone Haven, MD Taking Active  memantine (NAMENDA) 10 MG tablet [id number] Yes Take 1 tablet by mouth daily. [provider] Taking Active   metoprolol succinate (TOPROL-XL) 25 MG 24 hr tablet [id number] Yes Take 25 mg by mouth daily. [provider] Taking Active   Multiple Vitamins-Minerals (CENTRUM SILVER PO) [id number] Yes Take 1 tablet by mouth daily. [provider] Taking Active   omega-3 acid ethyl esters (LOVAZA) 1 g capsule [id number] Yes Take 2  capsules (2 g total) by mouth 2 (two) times daily. Leone Haven, MD Taking Active   omeprazole (PRILOSEC) 20 MG capsule [id number] Yes Take 20 mg by mouth daily. [provider] Taking Active Self           Med Note Josiah Lobo, KIMBERLY   Thu Aug 09, 2016 12:05 PM)    primidone (MYSOLINE) 50 MG tablet [id number] Yes Take 150-250 mg by mouth 2 (two) times daily. Take 250 mg by mouth in the morning & take 150 mg at night. [provider] Taking Active Self  promethazine-dextromethorphan (PROMETHAZINE-DM) 6.25-15 MG/5ML syrup [id number] No Take 5 mLs by mouth 4 (four) times daily as needed for cough.  Patient not taking: Reported on 05/10/2021   Kennyth Arnold, FNP Not Taking Active   QUEtiapine (SEROQUEL) 100 MG tablet [id number] Yes Take 150 mg by mouth at bedtime. [provider] Taking Active Pharmacy Records  Semaglutide,0.25 or 0.5MG /DOS, (OZEMPIC, 0.25 OR 0.5 MG/DOSE,) 2 MG/1.5ML SOPN [id number] Yes Inject 0.5 mg into the skin once a week. Leone Haven, MD Taking Active Pharmacy Records  simvastatin [facility]) 40 MG tablet [id number] Yes Take 40 mg by mouth daily. [provider] Taking Active   tamsulosin (FLOMAX) 0.4 MG CAPS capsule [id number] Yes TAKE ONE CAPSULE BY MOUTH DAILY AFTER Otilio Jefferson, Angela Adam, MD Taking Active   traZODone (DESYREL) 100 MG tablet [id number] Yes Take 2 tablets (200 mg total) by mouth at bedtime as needed for sleep. Norman Clay, MD Taking Active   varenicline (CHANTIX) 1 MG tablet [id number] No Take 1/2 tablet by mouth daily for 3 days, then 1/2 tablet by mouth twice daily for 4 days, then 1 tablet twice daily thereafter. Take with food.  Patient not taking: Reported on 05/10/2021   Leone Haven, MD Not Taking Active   vitamin C (ASCORBIC ACID) 500 MG tablet [id number] Yes Take 500 mg by mouth daily. [provider] Taking Active   Med List Note Landis Martins, RN 03/29/21 1357): MR 07-05-21 UDS 11/15/2020             Pertinent Labs:   Lab Results  Component Value Date   HGBA1C 7.5 (H) 05/10/2021   Lab Results  Component Value Date   CHOL 99 07/05/2020   HDL 43.20 07/05/2020   LDLCALC 57 09/25/2017   LDLDIRECT 31.0 07/05/2020   TRIG 226.0 (H) 07/05/2020   CHOLHDL 2 07/05/2020   Lab Results  Component Value Date   CREATININE 1.17 05/10/2021   BUN 26 (H) 05/10/2021   NA 133 (L) 05/10/2021   K 4.4 05/10/2021   CL 97 05/10/2021   CO2 29 05/10/2021    SDOH:  (Social Determinants of Health) assessments and interventions performed:  SDOH Interventions    Flowsheet Row Most Recent Value  SDOH Interventions   Financial Strain Interventions Other (Comment)  [tier exception]       CCM Care Plan  Review of patient past medical history, allergies, medications, health status, including review of consultants reports, laboratory and other test data,  was performed as part of comprehensive evaluation and provision of chronic care management services.   Care Plan : Medication Management  Updates made by De Hollingshead, RPH-CPP since 05/23/2021 12:00 AM     Problem: Diabetes, Depression      Long-Range Goal: Disease Progression Prevention   Start Date: 02/16/2020  Recent Progress: On track  Priority: Medium  Note:   Current Barriers:  Unable to independently afford treatment regimen  Pharmacist Clinical Goal(s): 3 Over the next 30 days, patient will verbalize ability to afford treatment regimen through collaboration with PharmD and provider.  Over the next 90 days, patient will maintain glycemic control as evidenced by A1c through collaboration with PharmD and provider.  Interventions: 1:1 collaboration with Leone Haven, MD regarding development and update of comprehensive plan of care as evidenced by provider attestation and co-signature Inter-disciplinary care team collaboration (see longitudinal plan of care) Comprehensive medication review performed;  medication list updated in electronic medical record  Diabetes: Uncontrolled; current treatment: Farxiga 10 mg daily; Ozempic 0.5 mg weekly (max tolerated dose d/t GI upset/weight loss at 1 mg weekly) Approved for Ozempic and Farxiga assistance through 2022.  Hx metformin ER - significant diarrhea that resolved upon discontinuation Previously recommended to continue current regimen at this time  Hypertension: Controlled per last clinic reading; current treatment: lisinopril/HCTZ 20/25 mg daily, metoprolol succinate 25 mg daily Previously recommended to continue current regimen at this time  Hyperlipidemia: Controlled; current treatment: rosuvastatin 40 mg daily, fenofibrate 145 mg daily, OTC omega 3 fatty acids 2 g BID Generic Lovaza and Vascepa too expensive. [name] funding closed.  Previously recommended to continue current regimen at this time  Depression/insomnia: Improved, sleep improved per patient report. Current regimen: bupropion XL 300 mg daily, trazodone 200 mg QPM. Follows w/ Dr. Modesta Messing and LCSW Psychiatry previously discussed reducing dose of quetiapine to reduce risk of EPS. Patient declined.  Previously recommended to continue current collaboration with psychiatry.   Chronic Pain: Improved per patient report; hydrocodone/APAP 10/325 mg up to TID, gabapentin 1200 mg TID; Follows w/ Dr. Holley Raring Previously recommended to continue current regimen at this time as prescribed  Neurologic Conditions (pseudo dementia, tremor, sleep disorder) Well managed per patient report; current regimen: memantine 10 mg BID, donepezil 10 mg daily; primidone 250 mg QAM, 150 mg QPM; divalproex 500 mg BID, quetiapine 150 mg QPM; Follows w/ Dr. Melrose Nakayama.  Previously recommended to continue current regimen at this time along with collaboration w/ Neurology  Chronic Diarrhea/Colitis, Barrett's: Controlled per patient report; current regimen: budesonide 6 mg daily, Lomotil 1 tab  QAM, hyoscyamine PRN added at last visit; follows w/ [facility] Barrett's: omeprazole 20 mg daily Previously recommended to continue current regimen at this time  OAB/BPH: Uncontrolled, notes some continued incomplete emptying and increased urinary frequency; current treatment regimen: tamsulosin 0.4 mg daily; follows w/ Dr. Diamantina Providence Previously recommended to continue current regimen at this time and continue to communicate with urology if further treatment is needed.   Tobacco Abuse: 0.5-0.75 packs per day; reports it is just "too hard" and he is a "hopeless case" Previously approved for Tier Exception for varenicline, however, in coverage gap and will need to wait until January to fill Previous quit attempts: unsuccessful using patches, gum. Currently unsuccessful on bupropion. Submitted varenicline Tier Exception for 2023 to [facility] for [employer]. Will follow for result.   Patient Goals/Self-Care Activities Over the next 90 days, patient will:  - take medications as prescribed check glucose at least  3 times daily using CGM, document, and provide at future appointments collaborate with provider on medication access solutions engage in dietary modifications by reducing carbohydrate portion sizes Focus on tobacco cessation      Plan: Telephone follow up appointment with care management team member scheduled for:  pending medication access  Catie Darnelle Maffucci, PharmD, [place], CPP Clinical Pharmacist [employer] at [facility] [phone]

## 2021-05-24 ENCOUNTER — Telehealth: Payer: Self-pay | Admitting: Pharmacy Technician

## 2021-05-24 ENCOUNTER — Ambulatory Visit: Payer: PPO | Admitting: Dermatology

## 2021-05-24 DIAGNOSIS — Z596 Low income: Secondary | ICD-10-CM

## 2021-05-24 NOTE — Progress Notes (Signed)
[facility])                                            [place] Team    05/24/2021  MARCELLES CLINARD [birth year] [phone]  Received both patient and provider portion(s) of patient assistance application(s) for Ozempic. Faxed completed application and required documents into [employer].   Leahmarie Gasiorowski P. Anniece Bleiler, [place]  [phone]

## 2021-05-25 ENCOUNTER — Telehealth: Payer: Self-pay | Admitting: Pharmacy Technician

## 2021-05-25 DIAGNOSIS — Z596 Low income: Secondary | ICD-10-CM

## 2021-05-25 NOTE — Progress Notes (Signed)
[facility])                                            [place] Team    05/25/2021  DONTERIUS FILLEY 27-Jun-1940 [id number]  Care coordination call placed to [place] in regard to [facility] application.  Spoke to [employer] who informs patient has been re enrolled and is APPROVED 05/21/21-05/20/22. She informs medication will automatically be refilled and mailed to his home based on last fill date in 2022.  Rene Gonsoulin P. Aayra Hornbaker, [place]  [phone]

## 2021-05-30 ENCOUNTER — Telehealth: Payer: Self-pay | Admitting: Pharmacist

## 2021-05-30 ENCOUNTER — Ambulatory Visit (INDEPENDENT_AMBULATORY_CARE_PROVIDER_SITE_OTHER): Payer: PPO | Admitting: Pharmacist

## 2021-05-30 DIAGNOSIS — E785 Hyperlipidemia, unspecified: Secondary | ICD-10-CM

## 2021-05-30 DIAGNOSIS — E1142 Type 2 diabetes mellitus with diabetic polyneuropathy: Secondary | ICD-10-CM

## 2021-05-30 DIAGNOSIS — Z720 Tobacco use: Secondary | ICD-10-CM

## 2021-05-30 DIAGNOSIS — I10 Essential (primary) hypertension: Secondary | ICD-10-CM

## 2021-05-30 NOTE — Chronic Care Management (AMB) (Signed)
Chronic Care Management CCM Pharmacy Note  05/30/2021 Name:  James Hood MRN:  [id number] DOB:  Jul 10, 1940  Summary: - Varenicline Tier Exception was approved. Covered at Tier 2 cost.   Recommendations/Changes made from today's visit: - Start varenicline 0.5 mg daily for 3 days, 0.5 mg twice daily for 4 days, then increase to 1 mg twice daily thereafter. Take with food  Subjective: James Hood is an 81 y.o. year old male who is a primary patient of Sonnenberg, Angela Adam, MD.  The CCM team was consulted for assistance with disease management and care coordination needs.    Care coordination  for follow up visit for pharmacy case management and/or care coordination services.   Objective:  Medications Reviewed Today     Reviewed by Gordy Councilman, CMA (Certified Medical Assistant) on 05/10/21 at [place] List Status: <None>   Medication Order Taking? Sig Documenting Provider Last Dose Status Informant  aspirin EC 81 MG tablet [id number] Yes Take 81 mg by mouth daily. [provider] Taking Active            Med Note Vanessa [place], SUSAN   Tue Apr 15, 2018  8:47 AM)    budesonide (ENTOCORT EC) 3 MG 24 hr capsule [id number] Yes Take 6 mg by mouth daily. [provider] Taking Active   buPROPion (WELLBUTRIN XL) 300 MG 24 hr tablet [id number] Yes Take 1 tablet (300 mg total) by mouth daily. Norman Clay, MD Taking Active   Continuous Blood Gluc Sensor (FREESTYLE LIBRE 14 DAY SENSOR) MISC [id number] Yes APPLY ONE SENSOR EVERY 14 DAYS TO MONITOR BLOOD GLUCOSE LEVELS. [place] Leone Haven, MD Taking Active   dapagliflozin propanediol (FARXIGA) 10 MG TABS tablet [id number] Yes Take 1 tablet (10 mg total) by mouth daily. Leone Haven, MD Taking Active   divalproex (DEPAKOTE ER) 500 MG 24 hr tablet [id number] Yes Take 1 tablet by mouth 2 (two) times daily. [provider] Taking Active   donepezil (ARICEPT) 10 MG tablet  [id number] Yes Take 10 mg by mouth at bedtime. [provider] Taking Active Self  doxycycline (VIBRA-TABS) 100 MG tablet [id number] Yes Take 1 tablet (100 mg total) by mouth 2 (two) times daily. [name] B, FNP Taking Active   fenofibrate (TRICOR) 145 MG tablet [id number] Yes TAKE ONE TABLET BY MOUTH DAILY Leone Haven, MD Taking Active   ferrous sulfate 325 (65 FE) MG EC tablet [id number] Yes Take 325 mg by mouth daily. [provider] Taking Active   gabapentin (NEURONTIN) 600 MG tablet [id number] Yes TAKE TWO TABLETS BY MOUTH THREE TIMES A DAY Leone Haven, MD Taking Active   HYDROcodone-acetaminophen [facility]) 10-325 MG tablet [id number] No Take 1 tablet by mouth every 4 (four) hours as needed for severe pain. Must last 30 days.  Patient not taking: Reported on 05/10/2021   [name], MD Not Taking Active   HYDROcodone-acetaminophen [facility]) 10-325 MG tablet [id number] Yes Take 1 tablet by mouth every 4 (four) hours as needed for severe pain. Must last 30 days. [name], MD Taking Active   Hyoscyamine Sulfate SL 0.125 MG SUBL [id number] Yes Place 1 tablet under the tongue daily as needed. [provider] Taking Active   ipratropium (ATROVENT) 0.03 % nasal spray [id number] No Place 2 sprays into both nostrils every 12 (twelve) hours.  Patient not taking: Reported on 05/10/2021   Leone Haven, MD Not Taking Active   lisinopril-hydrochlorothiazide (  ZESTORETIC) 20-25 MG tablet [id number] Yes Take 1 tablet by mouth daily. Leone Haven, MD Taking Active   memantine [facility]) 10 MG tablet [id number] Yes Take 1 tablet by mouth daily. [provider] Taking Active   metoprolol succinate (TOPROL-XL) 25 MG 24 hr tablet [id number] Yes Take 25 mg by mouth daily. [provider] Taking Active   Multiple Vitamins-Minerals (CENTRUM SILVER PO) [id number] Yes Take 1 tablet by mouth daily. [provider] Taking Active   omega-3 acid  ethyl esters (LOVAZA) 1 g capsule [id number] Yes Take 2 capsules (2 g total) by mouth 2 (two) times daily. Leone Haven, MD Taking Active   omeprazole (PRILOSEC) 20 MG capsule [id number] Yes Take 20 mg by mouth daily. [provider] Taking Active Self           Med Note Josiah Lobo, KIMBERLY   Thu Aug 09, 2016 12:05 PM)    primidone (MYSOLINE) 50 MG tablet [id number] Yes Take 150-250 mg by mouth 2 (two) times daily. Take 250 mg by mouth in the morning & take 150 mg at night. [provider] Taking Active Self  promethazine-dextromethorphan (PROMETHAZINE-DM) 6.25-15 MG/5ML syrup [id number] No Take 5 mLs by mouth 4 (four) times daily as needed for cough.  Patient not taking: Reported on 05/10/2021   Kennyth Arnold, FNP Not Taking Active   QUEtiapine (SEROQUEL) 100 MG tablet [id number] Yes Take 150 mg by mouth at bedtime. [provider] Taking Active Pharmacy Records  Semaglutide,0.25 or 0.5MG /DOS, (OZEMPIC, 0.25 OR 0.5 MG/DOSE,) 2 MG/1.5ML SOPN [id number] Yes Inject 0.5 mg into the skin once a week. Leone Haven, MD Taking Active Pharmacy Records  simvastatin [facility]) 40 MG tablet [id number] Yes Take 40 mg by mouth daily. [provider] Taking Active   tamsulosin (FLOMAX) 0.4 MG CAPS capsule [id number] Yes TAKE ONE CAPSULE BY MOUTH DAILY AFTER Otilio Jefferson, Angela Adam, MD Taking Active   traZODone (DESYREL) 100 MG tablet [id number] Yes Take 2 tablets (200 mg total) by mouth at bedtime as needed for sleep. Norman Clay, MD Taking Active   varenicline (CHANTIX) 1 MG tablet [id number] No Take 1/2 tablet by mouth daily for 3 days, then 1/2 tablet by mouth twice daily for 4 days, then 1 tablet twice daily thereafter. Take with food.  Patient not taking: Reported on 05/10/2021   Leone Haven, MD Not Taking Active   vitamin C (ASCORBIC ACID) 500 MG tablet [id number] Yes Take 500 mg by mouth daily. [provider] Taking Active   Med List Note Landis Martins, RN 03/29/21 1357): MR 07-05-21 UDS 11/15/2020            Pertinent Labs:   Lab Results  Component Value Date   HGBA1C 7.5 (H) 05/10/2021   Lab Results  Component Value Date   CHOL 99 07/05/2020   HDL 43.20 07/05/2020   LDLCALC 57 09/25/2017   LDLDIRECT 31.0 07/05/2020   TRIG 226.0 (H) 07/05/2020   CHOLHDL 2 07/05/2020   Lab Results  Component Value Date   CREATININE 1.17 05/10/2021   BUN 26 (H) 05/10/2021   NA 133 (L) 05/10/2021   K 4.4 05/10/2021   CL 97 05/10/2021   CO2 29 05/10/2021    SDOH:  (Social Determinants of Health) assessments and interventions performed:  SDOH Interventions    Flowsheet Row Most Recent Value  SDOH Interventions   SDOH Interventions for the Following Domains Tobacco  Tobacco Interventions Cessation Materials Given and Reviewed  CCM Care Plan  Review of patient past medical history, allergies, medications, health status, including review of consultants reports, laboratory and other test data, was performed as part of comprehensive evaluation and provision of chronic care management services.   Care Plan : Medication Management  Updates made by De Hollingshead, RPH-CPP since 05/30/2021 12:00 AM     Problem: Diabetes, Depression      Long-Range Goal: Disease Progression Prevention   Start Date: 02/16/2020  Recent Progress: On track  Priority: Medium  Note:   Current Barriers:  Unable to independently afford treatment regimen  Pharmacist Clinical Goal(s):  Over the next 30 days, patient will verbalize ability to afford treatment regimen through collaboration with PharmD and provider.  Over the next 90 days, patient will maintain glycemic control as evidenced by A1c through collaboration with PharmD and provider.  Interventions: 1:1 collaboration with Leone Haven, MD regarding development and update of comprehensive plan of care as evidenced by provider attestation and co-signature Inter-disciplinary  care team collaboration (see longitudinal plan of care) Comprehensive medication review performed; medication list updated in electronic medical record  Diabetes: Uncontrolled; current treatment: Farxiga 10 mg daily; Ozempic 0.5 mg weekly (max tolerated dose d/t GI upset/weight loss at 1 mg weekly) Approved for Ozempic and Farxiga assistance through 2022.  Hx metformin ER - significant diarrhea that resolved upon discontinuation Previously recommended to continue current regimen at this time  Hypertension: Controlled per last clinic reading; current treatment: lisinopril/HCTZ 20/25 mg daily, metoprolol succinate 25 mg daily Previously recommended to continue current regimen at this time  Hyperlipidemia: Controlled; current treatment: rosuvastatin 40 mg daily, fenofibrate 145 mg daily, OTC omega 3 fatty acids 2 g BID Generic Lovaza and Vascepa too expensive. [place] funding closed.  Previously recommended to continue current regimen at this time  Depression/insomnia: Improved, sleep improved per patient report. Current regimen: bupropion XL 300 mg daily, trazodone 200 mg QPM. Follows w/ Dr. Modesta Messing and LCSW Psychiatry previously discussed reducing dose of quetiapine to reduce risk of EPS. Patient declined.  Previously recommended to continue current collaboration with psychiatry.   Chronic Pain: Improved per patient report; hydrocodone/APAP 10/325 mg up to TID, gabapentin 1200 mg TID; Follows w/ Dr. Holley Raring Previously recommended to continue current regimen at this time as prescribed  Neurologic Conditions (pseudo dementia, tremor, sleep disorder) Well managed per patient report; current regimen: memantine 10 mg BID, donepezil 10 mg daily; primidone 250 mg QAM, 150 mg QPM; divalproex 500 mg BID, quetiapine 150 mg QPM; Follows w/ Dr. Melrose Nakayama.  Previously recommended to continue current regimen at this time along with collaboration w/ Neurology  Chronic Diarrhea/Colitis,  Barrett's: Controlled per patient report; current regimen: budesonide 6 mg daily, Lomotil 1 tab QAM, hyoscyamine PRN added at last visit; follows w/ [facility] Barrett's: omeprazole 20 mg daily Previously recommended to continue current regimen at this time  OAB/BPH: Uncontrolled, notes some continued incomplete emptying and increased urinary frequency; current treatment regimen: tamsulosin 0.4 mg daily; follows w/ Dr. Diamantina Providence Previously recommended to continue current regimen at this time and continue to communicate with urology if further treatment is needed.   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 Currently unsuccessful on bupropion. Received notice of approval of Tier Exception request for varenicline. Approved for Tier 2 copay. Called [facility], asked to run the prescription. Covered for $14. Called patient, reviewed dosing of 0.5 mg  once daily for 3 days, 0.5 mg twice daily for 4 days, then increasing to 1 mg weekly. Discussed taking with food to reduce risk of GI upset.   Patient Goals/Self-Care Activities Over the next 90 days, patient will:  - take medications as prescribed check glucose at least 3 times daily using CGM, document, and provide at future appointments collaborate with provider on medication access solutions engage in dietary modifications by reducing carbohydrate portion sizes Focus on tobacco cessation      Plan: Telephone follow up appointment with care management team member scheduled for:  5 weeks  Catie Darnelle Maffucci, PharmD, [place], [place] Pharmacist [employer] at [employer] [phone]

## 2021-05-30 NOTE — Telephone Encounter (Signed)
Called patient back. See CCM documentation 

## 2021-05-30 NOTE — Telephone Encounter (Signed)
Called patient to discuss prescription for varenicline. Left voicemail for patient to return my call at his convenience.

## 2021-05-30 NOTE — Patient Instructions (Addendum)
Visit Information  Following are the goals we discussed today:  Patient Goals/Self-Care Activities Over the next 90 days, patient will:  - take medications as prescribed check glucose at least 3 times daily using CGM, document, and provide at future appointments collaborate with provider on medication access solutions engage in dietary modifications by reducing carbohydrate portion sizes Focus on tobacco cessation        Plan: Telephone follow up appointment with care management team member scheduled for:  5 weeks   Catie Darnelle Maffucci, PharmD, [place], CPP Clinical Pharmacist [place] at [facility] [phone]   Please call the care guide team at [phone] if you need to cancel or reschedule your appointment.   The patient verbalized understanding of instructions, educational materials, and care plan provided today and declined offer to receive copy of patient instructions, educational materials, and care plan.

## 2021-05-30 NOTE — Telephone Encounter (Signed)
Pt is returning your call

## 2021-06-07 ENCOUNTER — Other Ambulatory Visit: Payer: Self-pay | Admitting: Family Medicine

## 2021-06-07 ENCOUNTER — Other Ambulatory Visit: Payer: Self-pay

## 2021-06-07 ENCOUNTER — Ambulatory Visit (INDEPENDENT_AMBULATORY_CARE_PROVIDER_SITE_OTHER): Payer: PPO | Admitting: Licensed Clinical Social Worker

## 2021-06-07 DIAGNOSIS — N400 Enlarged prostate without lower urinary tract symptoms: Secondary | ICD-10-CM

## 2021-06-07 DIAGNOSIS — F3342 Major depressive disorder, recurrent, in full remission: Secondary | ICD-10-CM

## 2021-06-07 NOTE — Progress Notes (Signed)
° °  THERAPIST PROGRESS NOTE  Session Time: 9-950a  Participation Level: Active  Behavioral Response: NeatAlertAnxious  Type of Therapy: Individual Therapy  Treatment Goals addressed:  Goal: LTG: Reduce frequency, intensity, and duration of depression symptoms as evidenced by: pt self report Outcome: Progressing  Goal: STG: @PREFFIRSTNAME @ will participate in at least 80% of scheduled individual psychotherapy sessions Outcome: Progressing  Interventions:  Intervention: Encourage effective communication techniques Intervention: Encourage family support Intervention: Assist with coping skills and behavior Intervention: Work with patient to identify the major components of a recent episode of anxiety: physical symptoms, major thoughts and images, and major behaviors they experienced   Summary: James Hood is a 81 y.o. male who presents with improving symptoms related to depression diagnosis. Pt reports current mood is stable and that he is managing stress and anxiety well. Pt reports that he is maintaining sober behavior and is currently sponsoring individuals through [place].    Allowed pt to explore and express thoughts and feelings associated with recent life situations and external stressors. Pt had court case with AA sponsee and it went well. Pt feels very confident in his ability to help sponsee through the court case and case was dismissed. 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 Pt is continuing to attend 3 AA meetings per week.   Discussed infant grandson--10 weeks old. The baby was born with kidney/bladder issues and is in ICU at [facility].  Pts wife is with the family helping with the other children 24/7.  Pt feels like his wife has "abandoned" him because she isn't calling him, etc.  Pt feels like his comfort zone has been taken from him and is feeling very vulnerable. Discussed strategies to help manage feelings of lonliness--encouraged social  engagement, attending more [place] meetings, reading in [place] book, focusing on faith, taking care of the dogs, and communicating with wife on a more consistent basis. Encouraged pt to have a talk with her about daily check-ins that would make pt feel less insecure about staying alone.   Discussed financial stress of having ductwork cleaned/sanitized in the home. Pt feels that he will need to take out a loan to do this and this triggers stress.   Continued recommendations are as follows: self care behaviors, positive social engagements, focusing on overall work/home/life balance, and focusing on positive physical and emotional wellness.  .   Suicidal/Homicidal: No  Therapist Response: Pt is continuing to apply interventions learned in session into daily life situations. Pt is currently on track to meet goals utilizing interventions mentioned above. Personal growth and progress noted. Treatment to continue as indicated.   Plan: Return again in 4 weeks.  Diagnosis: Axis I: MDD, remission    Axis II: No diagnosis    Rachel Bo Erianna Jolly, LCSW 06/07/2021

## 2021-06-07 NOTE — Plan of Care (Signed)
°  Problem: Decrease depressive symptoms and improve levels of effective functioning Goal: LTG: Reduce frequency, intensity, and duration of depression symptoms as evidenced by: pt self report Outcome: Progressing Goal: STG: @PREFFIRSTNAME @ will participate in at least 80% of scheduled individual psychotherapy sessions Outcome: Progressing Intervention: Encourage effective communication techniques Intervention: Encourage family support Intervention: Assist with coping skills and behavior Intervention: Work with patient to identify the major components of a recent episode of anxiety: physical symptoms, major thoughts and images, and major behaviors they experienced

## 2021-06-07 NOTE — Plan of Care (Signed)
°  Problem: Decrease depressive symptoms and improve levels of effective functioning Goal: LTG: Reduce frequency, intensity, and duration of depression symptoms as evidenced by: pt self report Outcome: Progressing Goal: STG: @PREFFIRSTNAME @ will participate in at least 80% of scheduled individual psychotherapy sessions Outcome: Progressing

## 2021-06-08 ENCOUNTER — Encounter: Payer: Self-pay | Admitting: Licensed Clinical Social Worker

## 2021-06-09 ENCOUNTER — Telehealth: Payer: Self-pay | Admitting: Pharmacy Technician

## 2021-06-09 DIAGNOSIS — Z596 Low income: Secondary | ICD-10-CM

## 2021-06-09 NOTE — Progress Notes (Signed)
[facility])                                            [place] Team    06/09/2021  James Hood 1941-03-23 [id number]   Care coordination calls placed to [employer] in regard to [place] application.  Spoke to [name] at [employer] who informs patient is APPROVED 05/21/21-05/20/22. Refills will auto process and ship to provider's office based on last refill in 2022 and going forward with delivery to the provider's office.  [name] Danialle Dement, [name]  [phone]

## 2021-06-20 ENCOUNTER — Other Ambulatory Visit: Payer: Self-pay

## 2021-06-20 DIAGNOSIS — E1142 Type 2 diabetes mellitus with diabetic polyneuropathy: Secondary | ICD-10-CM | POA: Diagnosis not present

## 2021-06-20 DIAGNOSIS — I10 Essential (primary) hypertension: Secondary | ICD-10-CM

## 2021-06-20 DIAGNOSIS — E785 Hyperlipidemia, unspecified: Secondary | ICD-10-CM | POA: Diagnosis not present

## 2021-06-21 NOTE — Telephone Encounter (Signed)
This should come from his GI physician.

## 2021-06-23 ENCOUNTER — Telehealth: Payer: Self-pay

## 2021-06-23 NOTE — Telephone Encounter (Signed)
This medication should be filled by his GI physician. It is not a medication that I have been managing for him and it is not one that I typically manage.

## 2021-06-23 NOTE — Telephone Encounter (Signed)
I faxed the refill request back to [facility] with comments that the medication was not prescribed by Dr. Caryl Bis. Confirmation given.  Jaela Yepez,cma

## 2021-06-26 ENCOUNTER — Telehealth: Payer: Self-pay | Admitting: Family Medicine

## 2021-06-26 ENCOUNTER — Other Ambulatory Visit: Payer: Self-pay | Admitting: Family Medicine

## 2021-06-26 ENCOUNTER — Ambulatory Visit (INDEPENDENT_AMBULATORY_CARE_PROVIDER_SITE_OTHER): Payer: PPO | Admitting: Family Medicine

## 2021-06-26 ENCOUNTER — Encounter: Payer: Self-pay | Admitting: Family Medicine

## 2021-06-26 ENCOUNTER — Other Ambulatory Visit: Payer: Self-pay

## 2021-06-26 VITALS — BP 120/70 | HR 73 | Temp 97.9°F | Ht 70.0 in | Wt 174.4 lb

## 2021-06-26 DIAGNOSIS — M79604 Pain in right leg: Secondary | ICD-10-CM

## 2021-06-26 DIAGNOSIS — E1142 Type 2 diabetes mellitus with diabetic polyneuropathy: Secondary | ICD-10-CM | POA: Diagnosis not present

## 2021-06-26 DIAGNOSIS — G2581 Restless legs syndrome: Secondary | ICD-10-CM | POA: Insufficient documentation

## 2021-06-26 DIAGNOSIS — E1165 Type 2 diabetes mellitus with hyperglycemia: Secondary | ICD-10-CM

## 2021-06-26 DIAGNOSIS — M79605 Pain in left leg: Secondary | ICD-10-CM | POA: Diagnosis not present

## 2021-06-26 DIAGNOSIS — I739 Peripheral vascular disease, unspecified: Secondary | ICD-10-CM

## 2021-06-26 DIAGNOSIS — K2270 Barrett's esophagus without dysplasia: Secondary | ICD-10-CM | POA: Diagnosis not present

## 2021-06-26 DIAGNOSIS — Z720 Tobacco use: Secondary | ICD-10-CM | POA: Diagnosis not present

## 2021-06-26 DIAGNOSIS — E782 Mixed hyperlipidemia: Secondary | ICD-10-CM

## 2021-06-26 LAB — FERRITIN: Ferritin: 129.9 ng/mL (ref 22.0–322.0)

## 2021-06-26 NOTE — Telephone Encounter (Signed)
I called and spoke with the patient and explained to him that the provider suggests a test called the ABI and he understood and he is okay with getting this test to check the blood flow in his legs.  Brooklinn Longbottom,cma

## 2021-06-26 NOTE — Assessment & Plan Note (Signed)
Uncontrolled.  He will try increasing his Ozempic to 0.75 mg once weekly.  In the past he had abdominal pain on the 1 mg dose.  He will continue to use his freestyle libre to monitor his sugars.  He will continue metformin 500 mg twice daily and Farxiga 10 mg daily.  He will let us know if he develops abdominal pain with the increased dose of Ozempic.

## 2021-06-26 NOTE — Patient Instructions (Signed)
Nice to see you. Please increase your Ozempic to 0.75 mg once weekly.  When you are close to running out please let us know so we can increase the dose of your pen to 1 mg so you only have to stick yourself 1 time. If you develop abdominal pain with this increased dose please let us know. We will check lab work and contact you with the results. Please reduce your carbohydrate intake. Please quit smoking. I will let you know what your pain specialist says regarding your leg pain.

## 2021-06-26 NOTE — Assessment & Plan Note (Signed)
Appears to have resolved on recent EGD.  He has reflux esophagitis.  He will remain on omeprazole 20 mg once daily.

## 2021-06-26 NOTE — Telephone Encounter (Signed)
ABIs have been ordered.

## 2021-06-26 NOTE — Addendum Note (Signed)
Addended by: Leone Haven on: 06/26/2021 12:23 PM   Modules accepted: Orders

## 2021-06-26 NOTE — Progress Notes (Signed)
Tommi Rumps, MD Phone: [phone]  James Hood is a 81 y.o. male who presents today for f/u.  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  He takes omeprazole.  No reflux or blood in his stool.  He occasionally has pills stick when he swallows a large amount of them at 1 time.  Tobacco use: He plans to quit smoking after he finishes his last carton of cigarettes.  He has not tried the Chantix.  Leg and ankle pain: This has been a chronic ongoing issue.  They start to bother him if he stands up from a seated position.  It starts to bother him after a minute.  It interferes with his ability to walk.  His pain worsens as he walks.  He takes gabapentin 1200 mg 3 times daily.  Occasionally he takes an extra gabapentin at night when his restless legs are bothering him.  He notes the legs have been moving more at night over the past year.  He does take iron.  Social History   Tobacco Use  Smoking Status Every Day   Packs/day: 0.50   Years: 60.00   Pack years: 30.00   Types: Cigarettes  Smokeless Tobacco Never    Current Outpatient Medications on File Prior to Visit  Medication Sig Dispense Refill   aspirin EC 81 MG tablet Take 81 mg by mouth daily.     budesonide (ENTOCORT EC) 3 MG 24 hr capsule Take 6 mg by mouth daily.     buPROPion (WELLBUTRIN XL) 300 MG 24 hr tablet Take 1 tablet (300 mg total) by mouth daily. 90 tablet 0   Continuous Blood Gluc Sensor (FREESTYLE LIBRE 14 DAY SENSOR) MISC APPLY ONE SENSOR EVERY 14 DAYS TO MONITOR BLOOD GLUCOSE LEVELS. REMOVE OLD SENSOR NEFORE APPLYING NEW SENSOR 2 each 5   dapagliflozin propanediol  (FARXIGA) 10 MG TABS tablet Take 1 tablet (10 mg total) by mouth daily. 90 tablet 3   divalproex (DEPAKOTE ER) 500 MG 24 hr tablet Take 1 tablet by mouth 2 (two) times daily.     donepezil (ARICEPT) 10 MG tablet Take 10 mg by mouth at bedtime.     doxycycline (VIBRA-TABS) 100 MG tablet Take 1 tablet (100 mg total) by mouth 2 (two) times daily. 20 tablet 0   fenofibrate (TRICOR) 145 MG tablet TAKE ONE TABLET BY MOUTH DAILY 90 tablet 1   ferrous sulfate 325 (65 FE) MG EC tablet Take 325 mg by mouth daily.     gabapentin (NEURONTIN) 600 MG tablet TAKE TWO TABLETS BY MOUTH THREE TIMES A DAY 180 tablet 1   HYDROcodone-acetaminophen (NORCO) 10-325 MG tablet Take 1 tablet by mouth every 4 (four) hours as needed for severe pain. Must last 30 days. 180 tablet 0   Hyoscyamine Sulfate SL 0.125 MG SUBL Place 1 tablet under the tongue daily as needed.     ipratropium (ATROVENT) 0.03 % nasal spray Place 2 sprays into both nostrils every 12 (twelve) hours. 30 mL 12   lisinopril-hydrochlorothiazide (ZESTORETIC) 20-25 MG tablet Take 1 tablet by mouth daily. 90 tablet 1   memantine (NAMENDA) 10 MG tablet Take 1 tablet by mouth daily.     metFORMIN (GLUCOPHAGE) 500  MG tablet Take 1 tablet (500 mg total) by mouth 2 (two) times daily with a meal. 180 tablet 3   metoprolol succinate (TOPROL-XL) 25 MG 24 hr tablet Take 25 mg by mouth daily.     Multiple Vitamins-Minerals (CENTRUM SILVER PO) Take 1 tablet by mouth daily.     omega-3 acid ethyl esters (LOVAZA) 1 g capsule Take 2 capsules (2 g total) by mouth 2 (two) times daily. 360 capsule 3   omeprazole (PRILOSEC) 20 MG capsule Take 20 mg by mouth daily.     primidone (MYSOLINE) 50 MG tablet Take 150-250 mg by mouth 2 (two) times daily. Take 250 mg by mouth in the morning & take 150 mg at night.     promethazine-dextromethorphan (PROMETHAZINE-DM) 6.25-15 MG/5ML syrup Take 5 mLs by mouth 4 (four) times daily as needed for cough. 118 mL 0   QUEtiapine (SEROQUEL) 100 MG  tablet Take 150 mg by mouth at bedtime.     Semaglutide,0.25 or 0.5MG /DOS, (OZEMPIC, 0.25 OR 0.5 MG/DOSE,) 2 MG/1.5ML SOPN Inject 0.5 mg into the skin once a week. 1.5 mL 3   simvastatin (ZOCOR) 40 MG tablet Take 40 mg by mouth daily.     tamsulosin (FLOMAX) 0.4 MG CAPS capsule TAKE ONE CAPSULE BY MOUTH DAILY AFTER DINNER 90 capsule 1   traZODone (DESYREL) 100 MG tablet Take 2 tablets (200 mg total) by mouth at bedtime as needed for sleep. 180 tablet 0   varenicline (CHANTIX) 1 MG tablet Take 1/2 tablet by mouth daily for 3 days, then 1/2 tablet by mouth twice daily for 4 days, then 1 tablet twice daily thereafter. Take with food. 60 tablet 5   vitamin C (ASCORBIC ACID) 500 MG tablet Take 500 mg by mouth daily.     No current facility-administered medications on file prior to visit.     ROS see history of present illness  Objective  Physical Exam Vitals:   06/26/21 0826  BP: 120/70  Pulse: 73  Temp: 97.9 F (36.6 C)  SpO2: 97%    BP Readings from Last 3 Encounters:  06/26/21 120/70  05/10/21 100/60  04/11/21 120/80   Wt Readings from Last 3 Encounters:  06/26/21 174 lb 6.4 oz (79.1 kg)  05/10/21 166 lb 12.8 oz (75.7 kg)  04/11/21 165 lb (74.8 kg)    Physical Exam Constitutional:      General: He is not in acute distress.    Appearance: He is not diaphoretic.  Cardiovascular:     Rate and Rhythm: Normal rate and regular rhythm.     Heart sounds: Normal heart sounds.  Pulmonary:     Effort: Pulmonary effort is normal.     Breath sounds: Normal breath sounds.  Skin:    General: Skin is warm and dry.  Neurological:     Mental Status: He is alert.     Comments: 5/5 strength bilateral quads, hamstrings, plantarflexion, and dorsiflexion, sensation to light touch intact bilateral lower extremities     Assessment/Plan: Please see individual problem list.  Problem List Items Addressed This Visit     Barrett's esophagus    Appears to have resolved on recent EGD.  He  has reflux esophagitis.  He will remain on omeprazole 20 mg once daily.      Bilateral leg pain    History seems consistent with spinal stenosis though the patient's most recent MRI from 2018 does not reveal any spinal stenosis in his lumbar spine.  It is certainly possible he has  developed some spinal stenosis since then.  I will communicate with his pain specialist to get their input on the next step.  After the visit I thought of vascular claudication as a potential cause.  CMA will contact the patient to see if he is willing to do ABIs.      RLS (restless legs syndrome) - Primary    Worsened over the last year.  This is a chronic issue.  We will check a ferritin.  He will continue his iron supplement.  We will consider additional treatments if his ferritin level is acceptable.      Relevant Orders   Ferritin   Tobacco abuse    I encouraged smoking cessation.  Discussed starting the Chantix 1 week prior to his quit date.      Type 2 diabetes mellitus (HCC)    Uncontrolled.  He will try increasing his Ozempic to 0.75 mg once weekly.  In the past he had abdominal pain on the 1 mg dose.  He will continue to use his freestyle libre to monitor his sugars.  He will continue metformin 500 mg twice daily and Farxiga 10 mg daily.  He will let us know if he develops abdominal pain with the increased dose of Ozempic.       Return in about 3 months (around 09/23/2021).  This visit occurred during the SARS-CoV-2 public health emergency.  Safety protocols were in place, including screening questions prior to the visit, additional usage of staff PPE, and extensive cleaning of exam room while observing appropriate contact time as indicated for disinfecting solutions.    Tommi Rumps, MD [place]

## 2021-06-26 NOTE — Assessment & Plan Note (Signed)
I encouraged smoking cessation.  Discussed starting the Chantix 1 week prior to his quit date.

## 2021-06-26 NOTE — Telephone Encounter (Signed)
Can you let the patient know that after he left I thought of another option for evaluation for the pain he gets in his legs?  We need to evaluate the blood flow in his legs and I can order a test called an ABI to be completed to make sure that the blood flow is good.  Occasionally poor blood flow can lead to the symptoms he describes.  I can place this order if he is okay with it.

## 2021-06-26 NOTE — Assessment & Plan Note (Addendum)
History seems consistent with spinal stenosis though the patient's most recent MRI from 2018 does not reveal any spinal stenosis in his lumbar spine.  It is certainly possible he has developed some spinal stenosis since then.  I will communicate with his pain specialist to get their input on the next step.  After the visit I thought of vascular claudication as a potential cause.  CMA will contact the patient to see if he is willing to do ABIs.

## 2021-06-26 NOTE — Assessment & Plan Note (Signed)
Worsened over the last year.  This is a chronic issue.  We will check a ferritin.  He will continue his iron supplement.  We will consider additional treatments if his ferritin level is acceptable.

## 2021-06-27 ENCOUNTER — Ambulatory Visit
Admission: RE | Admit: 2021-06-27 | Discharge: 2021-06-27 | Disposition: A | Discharge status: Home / Self Care | Payer: PPO | Source: Ambulatory Visit | Attending: Family Medicine | Admitting: Family Medicine

## 2021-06-27 DIAGNOSIS — I739 Peripheral vascular disease, unspecified: Secondary | ICD-10-CM | POA: Diagnosis not present

## 2021-06-27 DIAGNOSIS — I70213 Atherosclerosis of native arteries of extremities with intermittent claudication, bilateral legs: Secondary | ICD-10-CM | POA: Diagnosis not present

## 2021-06-29 ENCOUNTER — Other Ambulatory Visit: Payer: Self-pay

## 2021-06-29 ENCOUNTER — Ambulatory Visit
Payer: PPO | Attending: Student in an Organized Health Care Education/Training Program | Admitting: Student in an Organized Health Care Education/Training Program

## 2021-06-29 ENCOUNTER — Encounter: Payer: Self-pay | Admitting: Student in an Organized Health Care Education/Training Program

## 2021-06-29 VITALS — BP 100/66 | HR 84 | Temp 97.2°F | Resp 18 | Ht 70.0 in | Wt 175.0 lb

## 2021-06-29 DIAGNOSIS — K5903 Drug induced constipation: Secondary | ICD-10-CM | POA: Insufficient documentation

## 2021-06-29 DIAGNOSIS — M25512 Pain in left shoulder: Secondary | ICD-10-CM | POA: Diagnosis not present

## 2021-06-29 DIAGNOSIS — M47816 Spondylosis without myelopathy or radiculopathy, lumbar region: Secondary | ICD-10-CM | POA: Diagnosis not present

## 2021-06-29 DIAGNOSIS — G894 Chronic pain syndrome: Secondary | ICD-10-CM | POA: Diagnosis not present

## 2021-06-29 DIAGNOSIS — M19072 Primary osteoarthritis, left ankle and foot: Secondary | ICD-10-CM | POA: Diagnosis not present

## 2021-06-29 DIAGNOSIS — M5417 Radiculopathy, lumbosacral region: Secondary | ICD-10-CM | POA: Insufficient documentation

## 2021-06-29 DIAGNOSIS — T402X5A Adverse effect of other opioids, initial encounter: Secondary | ICD-10-CM | POA: Insufficient documentation

## 2021-06-29 DIAGNOSIS — G8929 Other chronic pain: Secondary | ICD-10-CM | POA: Insufficient documentation

## 2021-06-29 DIAGNOSIS — M5386 Other specified dorsopathies, lumbar region: Secondary | ICD-10-CM | POA: Diagnosis not present

## 2021-06-29 DIAGNOSIS — F1190 Opioid use, unspecified, uncomplicated: Secondary | ICD-10-CM | POA: Insufficient documentation

## 2021-06-29 DIAGNOSIS — M19012 Primary osteoarthritis, left shoulder: Secondary | ICD-10-CM | POA: Diagnosis not present

## 2021-06-29 MED ORDER — HYDROCODONE-ACETAMINOPHEN 10-325 MG PO TABS: 1.0000 | ORAL_TABLET | ORAL | 0 refills | Status: AC | PRN

## 2021-06-29 MED ORDER — HYDROCODONE-ACETAMINOPHEN 10-325 MG PO TABS: 1.0000 | ORAL_TABLET | Freq: Four times a day (QID) | ORAL | 0 refills | Status: AC | PRN

## 2021-06-29 NOTE — Progress Notes (Signed)
Safety precautions to be maintained throughout the outpatient stay will include: orient to surroundings, keep bed in low position, maintain call bell within reach at all times, provide assistance with transfer out of bed and ambulation.   Nursing Pain Medication Assessment:  Safety precautions to be maintained throughout the outpatient stay will include: orient to surroundings, keep bed in low position, maintain call bell within reach at all times, provide assistance with transfer out of bed and ambulation.  Medication Inspection Compliance: Pill count conducted under aseptic conditions, in front of the patient. Neither the pills nor the bottle was removed from the patient's sight at any time. Once count was completed pills were immediately returned to the patient in their original bottle.  Medication: Hydrocodone/APAP Pill/Patch Count:  71 of 180 pills remain Pill/Patch Appearance: Markings consistent with prescribed medication Bottle Appearance: Standard pharmacy container. Clearly labeled. Filled Date: 01 / 16 / 2023 Last Medication intake:  Yesterday

## 2021-06-29 NOTE — Progress Notes (Signed)
PROVIDER NOTE: Information contained herein reflects review and annotations entered in association with encounter. Interpretation of such information and data should be left to medically-trained personnel. Information provided to patient can be located elsewhere in the medical record under "Patient Instructions". Document created using STT-dictation technology, any transcriptional errors that may result from process are unintentional.    Patient: James Hood  Service Category: E/M  Provider: [name], MD  DOB: Oct 16, 1940  DOS: 06/29/2021  Specialty: Interventional Pain Management  MRN: [id number]  Setting: Ambulatory outpatient  PCP: Leone Haven, MD  Type: Established Patient    Referring Provider: Leone Haven, MD  Location: Office  Delivery: Face-to-face     HPI  James Hood, a 81 y.o. year old male, is here today because of his Lumbar facet joint syndrome [M47.816]. James Hood's primary complain today is Ankle Pain (Left )  Last encounter: My last encounter with him was on 03/29/21  Pertinent problems: James Hood has Primary localized osteoarthritis of right knee; DJD (degenerative joint disease) of knee; Chronic lumbar pain; Lumbar herniated disc; Chronic pain syndrome; Failed back surgical syndrome; Lumbosacral radiculopathy; Lumbar facet joint syndrome; Neuropathy; Chronic pain of left ankle; and Impingement of left ankle joint on their pertinent problem list. Pain Assessment: Severity of Chronic pain is reported as a 5 /10. Location: Ankle Left/Radaites upwards towards below calf. Onset: More than a month ago. Quality: Constant, Aching. Timing: Constant. Modifying factor(s): Denies, medication not really helping. Vitals:  height is 5' 10"  (1.778 m) and weight is 175 lb (79.4 kg). His temporal temperature is 97.2 F (36.2 C) (abnormal). His blood pressure is 100/66 and his pulse is 84. His respiration is 18 and oxygen saturation is 96%.   Reason for encounter:  medication management.   Patient follows up today for medication management. Continues to endorse severe left ankle pain.  This is secondary to severe osteoarthritis.  He also endorses low back pain which is secondary to severe lumbar facet arthropathy at L3, L4, L5.  We have discussed diagnostic lumbar facet medial branch nerve blocks for this condition.  Risk and benefits reviewed and patient would like to proceed with that.  I will also reduce his prescribed dose to accurately reflect what he is taking which is hydrocodone 10 mg every 6 hours as needed.  He continues gabapentin as prescribed.  We discussed the utility of follow-up imaging however that would not necessarily change management in our case.  Patient is not interested and repeating epidural steroid injections.  He has already tried spinal cord stimulation.  Is not interested in surgery.  For this reason we will continue to monitor his symptoms and will consider further imaging if his condition deteriorates.   Pharmacotherapy Assessment  Analgesic: Hydrocodone 10 mg every 4 hours as needed, quantity 180/month; MME equals 60    Monitoring: [place] PMP: PDMP reviewed during this encounter.       Pharmacotherapy: No side-effects or adverse reactions reported. Compliance: No problems identified. Effectiveness: Clinically acceptable.  Al Decant, RN  06/29/2021  8:13 AM  Sign when Signing Visit Safety precautions to be maintained throughout the outpatient stay will include: orient to surroundings, keep bed in low position, maintain call bell within reach at all times, provide assistance with transfer out of bed and ambulation.   Nursing Pain Medication Assessment:  Safety precautions to be maintained throughout the outpatient stay will include: orient to surroundings, keep bed in low position, maintain call bell within reach at all  times, provide assistance with transfer out of bed and ambulation.  Medication Inspection Compliance: Pill  count conducted under aseptic conditions, in front of the patient. Neither the pills nor the bottle was removed from the patient's sight at any time. Once count was completed pills were immediately returned to the patient in their original bottle.  Medication: Hydrocodone/APAP Pill/Patch Count:  71 of 180 pills remain Pill/Patch Appearance: Markings consistent with prescribed medication Bottle Appearance: Standard pharmacy container. Clearly labeled. Filled Date: 01 / 16 / 2023 Last Medication intake:  Yesterday     UDS:  Summary  Date Value Ref Range Status  11/15/2020 Note  Final    Comment:    ==================================================================== [employer] [facility] 13 (MW) ==================================================================== Test                             Result       Flag       Units  Drug Present and Declared for Prescription Verification   Hydrocodone                    1623         EXPECTED   ng/mg creat   Hydromorphone                  226          EXPECTED   ng/mg creat   Dihydrocodeine                 105          EXPECTED   ng/mg creat   Norhydrocodone                 3285         EXPECTED   ng/mg creat    Sources of hydrocodone include scheduled prescription medications.    Hydromorphone, dihydrocodeine and norhydrocodone are expected    metabolites of hydrocodone. Hydromorphone and dihydrocodeine are    also available as scheduled prescription medications.    Phenobarbital                  PRESENT      EXPECTED    Phenobarbital is an expected metabolite of primidone; Phenobarbital    may also be administered as a prescription drug.  ==================================================================== Test                      Result    Flag   Units      Ref Range   Creatinine              74               mg/dL      >=20 ==================================================================== Declared Medications:  The flagging and  interpretation on this report are based on the  following declared medications.  Unexpected results may arise from  inaccuracies in the declared medications.   **Note: The testing scope of this panel includes these medications:   Hydrocodone (Norco)  Primidone (Mysoline)   **Note: The testing scope of this panel does not include the  following reported medications:   Acetaminophen (Norco)  Aspirin  Atropine (Lomotil)  Budesonide (Entocort)  Bupropion (Wellbutrin)  Diphenoxylate (Lomotil)  Divaleproex (Depakote)  Donepezil (Aricept)  Empagliflozin (Jardiance)  Fenofibrate (TriCor)  Finasteride (Proscar)  Fish Oil  Gabapentin (Neurontin)  Hydrochlorothiazide  Hyoscyamine  Iron  Lisinopril  Memantine (Namenda)  Metoprolol (Toprol)  Mirabegron (Myrbetriq)  Multivitamin  Naloxegol (Movantik)  Omeprazole (Prilosec)  Quetiapine (Seroquel)  Semaglutide (Ozempic)  Tamsulosin (Flomax)  Trazodone (Desyrel) ==================================================================== For clinical consultation, please call [phone]. ====================================================================      ROS  Constitutional: Denies any fever or chills Gastrointestinal: No reported hemesis, hematochezia, vomiting, or acute GI distress Musculoskeletal: Low back pain, left ankle pain Neurological: No reported episodes of acute onset apraxia, aphasia, dysarthria, agnosia, amnesia, paralysis, loss of coordination, or loss of consciousness  Medication Review  FreeStyle Libre 14 Day Sensor, HYDROcodone-acetaminophen, Hyoscyamine Sulfate SL, Multiple Vitamins-Minerals, QUEtiapine, Semaglutide(0.25 or 0.5MG/DOS), aspirin EC, buPROPion, budesonide, dapagliflozin propanediol, divalproex, donepezil, doxycycline, fenofibrate, ferrous sulfate, gabapentin, ipratropium, lisinopril-hydrochlorothiazide, memantine, metFORMIN, metoprolol succinate, omega-3 acid ethyl esters, omeprazole, primidone,  promethazine-dextromethorphan, simvastatin, tamsulosin, traZODone, varenicline, and vitamin C  History Review  Allergy: James Hood has No Known Allergies. Drug: James Hood  reports no history of drug use. Alcohol:  reports that he does not currently use alcohol. Tobacco:  reports that he has been smoking cigarettes. He has a 30.00 pack-year smoking history. He has never used smokeless tobacco. Social: James Hood  reports that he has been smoking cigarettes. He has a 30.00 pack-year smoking history. He has never used smokeless tobacco. He reports that he does not currently use alcohol. He reports that he does not use drugs. 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 Family: family history includes Depression in his father; Heart attack in his mother; Heart disease in his mother; Hypertension in his father.  Laboratory  Chemistry Profile   Renal Lab Results  Component Value Date   BUN 26 (H) 05/10/2021   CREATININE 1.17 05/10/2021   GFR 58.56 (L) 05/10/2021   GFRAA >60 09/25/2019   GFRNONAA 55 (L) 06/08/2020    Hepatic Lab Results  Component Value Date   AST 15 10/11/2020   ALT 11 10/11/2020   ALBUMIN 4.1 10/11/2020   ALKPHOS 41 10/11/2020   AMMONIA 14 08/09/2016    Electrolytes Lab Results  Component Value Date   NA 133 (L) 05/10/2021   K 4.4 05/10/2021   CL 97 05/10/2021   CALCIUM 10.4 05/10/2021   MG 1.4 (L) 09/08/2020    Bone Lab Results  Component Value Date   VD25OH 24.57 (L) 10/11/2020    Inflammation (CRP: Acute Phase) (ESR: Chronic Phase) Lab Results  Component Value Date   ESRSEDRATE 15 07/20/2019   LATICACIDVEN 1.9 06/08/2020         Note: Above Lab results reviewed.   CLINICAL DATA:  Left sciatic pain for 2 years.  Prior back surgery.   EXAM: MRI LUMBAR SPINE WITHOUT CONTRAST   TECHNIQUE: Multiplanar, multisequence MR imaging of the lumbar spine was performed. No intravenous contrast was administered.   COMPARISON:  03/29/2016   FINDINGS: Segmentation:  Standard.   Alignment:  Physiologic.   Vertebrae:  No fracture, evidence of discitis, or bone lesion.   Conus medullaris and cauda equina: Conus extends to the T12-L1 level. Conus and cauda equina appear normal.   Paraspinal and other soft tissues: No paraspinal abnormality.   Disc levels:   Disc spaces: Degenerative disc disease mild disc height loss throughout the lumbar spine.   T12-L1: No significant disc bulge. No evidence of neural foraminal stenosis. No central canal stenosis.   L1-L2: No significant disc bulge. No evidence of neural foraminal stenosis. No central canal stenosis.   L2-L3: No significant disc bulge. No evidence of neural foraminal stenosis. No central canal stenosis.   L3-L4: Mild broad-based disc bulge. Mild bilateral facet arthropathy. Moderate right foraminal  stenosis. No central canal stenosis.   L4-L5: Mild broad-based disc bulge. Severe bilateral facet arthropathy. Bilateral lateral recess stenosis. Mild bilateral foraminal stenosis. No central canal stenosis.   L5-S1: Broad-based disc bulge with a small central disc protrusion. Moderate bilateral facet arthropathy. Mild left foraminal stenosis. No central canal stenosis.   IMPRESSION: 1. At L3-4 there is a mild broad-based disc bulge. Mild bilateral facet arthropathy. Moderate right foraminal stenosis. 2. At L4-5 there is a mild broad-based disc bulge. Severe bilateral facet arthropathy. Bilateral lateral recess stenosis. Mild bilateral foraminal stenosis. 3. At L5-S1 there is a broad-based disc bulge with a small central disc protrusion. Moderate bilateral facet arthropathy. Mild left foraminal stenosis.     Electronically Signed   By: Kathreen Devoid   On: 04/19/2017 10:33  Physical Exam  General appearance: Well nourished, well developed, and well hydrated. In no apparent acute distress Mental status: Alert, oriented x 3 (person, place, & time)       Respiratory: No evidence of acute respiratory distress Eyes: PERLA Vitals: BP 100/66    Pulse 84    Temp (!) 97.2 F (36.2 C) (Temporal)    Resp 18    Ht 5' 10"  (1.778 m)    Wt 175 lb (79.4 kg)    SpO2 96%    BMI 25.11 kg/m  BMI: Estimated body mass index is 25.11 kg/m as calculated from the following:   Height as of this encounter: 5' 10"  (1.778 m).   Weight as of this encounter: 175 lb (79.4 kg). Ideal: Ideal body weight: 73 kg (160 lb 15 oz) Adjusted ideal body weight: 75.6 kg (166 lb 9 oz)  Cervical Spine Area Exam  Skin & Axial Inspection: No masses, redness, edema, swelling, or associated skin lesions Alignment: Symmetrical Functional ROM: Unrestricted ROM      Stability: No instability detected Muscle Tone/Strength: Functionally intact. No obvious neuro-muscular anomalies detected. Sensory (Neurological):  Unimpaired Palpation: No palpable anomalies              Lumbar Spine Area Exam  Skin & Axial Inspection: No masses, redness, or swelling Alignment: Symmetrical Functional ROM: Pain restricted ROM       Stability: No instability detected Muscle Tone/Strength: Functionally intact. No obvious neuro-muscular anomalies detected. Sensory (Neurological): Musculoskeletal pain pattern articular, facet mediated Palpation: No palpable anomalies        Gait & Posture Assessment  Ambulation: Unassisted Gait: Relatively normal for age and body habitus Posture: WNL  Lower Extremity Exam    Side: Right lower extremity  Side: Left lower extremity  Stability: No instability observed          Stability: No instability observed  Skin & Extremity Inspection: Skin color, temperature, and hair growth are WNL. No peripheral edema or cyanosis. No masses, redness, swelling, asymmetry, or associated skin lesions. No contractures.  Skin & Extremity Inspection: Skin color, temperature, and hair growth are WNL. No peripheral edema or cyanosis. No masses, redness, swelling, asymmetry, or associated skin lesions. No contractures.  Functional ROM: Unrestricted ROM                  Functional ROM: Unrestricted ROM                  Muscle Tone/Strength: Functionally intact. No obvious neuro-muscular anomalies detected.  Muscle Tone/Strength: Functionally intact. No obvious neuro-muscular anomalies detected.  Sensory (Neurological): Unimpaired        Sensory (Neurological): Arthropathic arthralgia ankle        DTR: Patellar: deferred today Achilles: deferred today Plantar: deferred today  DTR: Patellar: deferred today Achilles: deferred today Plantar: deferred today  Palpation: No palpable anomalies  Palpation: No palpable anomalies    Assessment   Status Diagnosis  Having a Flare-up Having a Flare-up Persistent 1. Lumbar facet joint syndrome   2. Facet arthritis of lumbar region   3. Chronic pain  syndrome   4. Primary osteoarthritis of left shoulder   5. Primary osteoarthritis of left ankle   6. Chronic left shoulder pain   7. Therapeutic opioid-induced constipation (OIC)   8. Chronic, continuous use of opioids   9. Lumbosacral radiculopathy   10. Sciatica of left side associated with disorder of lumbar spine       Plan of Care   [name] has a current medication list which includes the following long-term medication(s): bupropion, donepezil, fenofibrate, gabapentin, hyoscyamine sulfate sl, ipratropium, lisinopril-hydrochlorothiazide, memantine, metformin, metoprolol succinate, omega-3 acid ethyl esters, omeprazole, primidone, quetiapine, and trazodone.  Pharmacotherapy (Medications Ordered): Meds ordered this encounter  Medications   HYDROcodone-acetaminophen (NORCO) 10-325 MG tablet    Sig: Take 1 tablet by mouth every 6 (six) hours as needed for severe pain. Must last 30 days.    Dispense:  120 tablet    Refill:  0    Chronic Pain: STOP Act (Not applicable) Fill 1 day early if closed on refill date. Avoid benzodiazepines within 8 hours of opioids   HYDROcodone-acetaminophen (NORCO) 10-325 MG tablet    Sig: Take 1 tablet by mouth every 4 (four) hours as needed for severe pain. Must last 30 days.    Dispense:  120 tablet    Refill:  0    Chronic Pain: STOP Act (Not applicable) Fill 1 day early if closed on refill date. Avoid benzodiazepines within 8 hours of opioids   HYDROcodone-acetaminophen (NORCO) 10-325 MG tablet    Sig: Take 1 tablet by mouth every 6 (six) hours as needed for severe pain. Must last 30 days.    Dispense:  120 tablet    Refill:  0    Chronic Pain: STOP Act (Not applicable) Fill 1 day early if closed on refill date. Avoid benzodiazepines within 8 hours of opioids   Continue gabapentin as prescribed.  No refills needed.   James Hood has a history of greater than 3 months of moderate to severe pain which is resulted in functional  impairment.  The patient has tried various conservative therapeutic options such as NSAIDs, Tylenol, muscle relaxants, physical therapy which was inadequately effective.  Patient's pain is predominantly axial with physical exam findings and MRI suggestive of severe facet arthropathy. Lumbar facet medial  branch nerve blocks were discussed with the patient.  Risks and benefits were reviewed.  Patient would like to proceed with bilateral L3, L4, L5 medial branch nerve block.  Orders:  Orders Placed This Encounter  Procedures   LUMBAR FACET(MEDIAL BRANCH NERVE BLOCK) MBNB    Standing Status:   Future    Standing Expiration Date:   07/27/2021    Scheduling Instructions:     Procedure: Lumbar facet block (AKA.: Lumbosacral medial branch nerve block)     Side: Bilateral     Level: L3-4, L5-S1 Facets (L3, L4, L5, Medial Branch Nerves)     IV Versed     Timeframe: ASAA    Order Specific Question:   Where will this procedure be performed?    Answer:   [facility]    Follow-up plan:   Return in about 2 weeks (around 07/13/2021) for B/L L3, 4, 5 Fct block, in clinic IV Versed.    Recent Visits No visits were found meeting these conditions. Showing recent visits within past 90 days and meeting all other requirements Today's Visits Date Type Provider Dept  06/29/21 Office Visit [name], MD [facility]  Showing today's visits and meeting all other requirements Future Appointments Date Type Provider Dept  07/12/21 Appointment [name], MD [facility]  09/26/21 Appointment [name], MD [facility]  Showing future appointments within next 90 days and meeting all other requirements  I discussed the assessment and treatment plan with the patient. The patient was provided an opportunity to ask questions and all were answered. The patient agreed with the plan and demonstrated an understanding of the instructions.  Patient advised to call back or seek  an in-person evaluation if the symptoms or condition worsens.  Duration of encounter: 30 minutes.  Note by: [name], MD Date: 06/29/2021; Time: 8:59 AM

## 2021-06-29 NOTE — Patient Instructions (Signed)
Facet Blocks Patient Information  Description: The facets are joints in the spine between the vertebrae.  Like any joints in the body, facets can become irritated and painful.  Arthritis can also effect the facets.  By injecting steroids and local anesthetic in and around these joints, we can temporarily block the nerve supply to them.  Steroids act directly on irritated nerves and tissues to reduce selling and inflammation which often leads to decreased pain.  Facet blocks may be done anywhere along the spine from the neck to the low back depending upon the location of your pain.   After numbing the skin with local anesthetic (like Novocaine), a small needle is passed onto the facet joints under x-ray guidance.  You may experience a sensation of pressure while this is being done.  The entire block usually lasts about 15-25 minutes.   Conditions which may be treated by facet blocks:  Low back/buttock pain Neck/shoulder pain Certain types of headaches  Preparation for the injection:  Do not eat any solid food or dairy products within 8 hours of your appointment. You may drink clear liquid up to 3 hours before appointment.  Clear liquids include water, black coffee, juice or soda.  No milk or cream please. You may take your regular medication, including pain medications, with a sip of water before your appointment.  Diabetics should hold regular insulin (if taken separately) and take 1/2 normal NPH dose the morning of the procedure.  Carry some sugar containing items with you to your appointment. A driver must accompany you and be prepared to drive you home after your procedure. Bring all your current medications with you. An IV may be inserted and sedation may be given at the discretion of the physician. A blood pressure cuff, EKG and other monitors will often be applied during the procedure.  Some patients may need to have extra oxygen administered for a short period. You will be asked to  provide medical information, including your allergies and medications, prior to the procedure.  We must know immediately if you are taking blood thinners (like Coumadin/Warfarin) or if you are allergic to IV iodine contrast (dye).  We must know if you could possible be pregnant.  Possible side-effects:  Bleeding from needle site Infection (rare, may require surgery) Nerve injury (rare) Numbness & tingling (temporary) Difficulty urinating (rare, temporary) Spinal headache (a headache worse with upright posture) Light-headedness (temporary) Pain at injection site (serveral days) Decreased blood pressure (rare, temporary) Weakness in arm/leg (temporary) Pressure sensation in back/neck (temporary)   Call if you experience:  Fever/chills associated with headache or increased back/neck pain Headache worsened by an upright position New onset, weakness or numbness of an extremity below the injection site Hives or difficulty breathing (go to the emergency room) Inflammation or drainage at the injection site(s) Severe back/neck pain greater than usual New symptoms which are concerning to you  Please note:  Although the local anesthetic injected can often make your back or neck feel good for several hours after the injection, the pain will likely return. It takes 3-7 days for steroids to work.  You may not notice any pain relief for at least one week.  If effective, we will often do a series of 2-3 injections spaced 3-6 weeks apart to maximally decrease your pain.  After the initial series, you may be a candidate for a more permanent nerve block of the facets.  If you have any questions, please call [phone] [facility]   Pain Clinic 

## 2021-07-04 ENCOUNTER — Ambulatory Visit (INDEPENDENT_AMBULATORY_CARE_PROVIDER_SITE_OTHER): Payer: PPO | Admitting: Pharmacist

## 2021-07-04 DIAGNOSIS — E1142 Type 2 diabetes mellitus with diabetic polyneuropathy: Secondary | ICD-10-CM

## 2021-07-04 DIAGNOSIS — I10 Essential (primary) hypertension: Secondary | ICD-10-CM

## 2021-07-04 DIAGNOSIS — E785 Hyperlipidemia, unspecified: Secondary | ICD-10-CM

## 2021-07-04 DIAGNOSIS — N1831 Chronic kidney disease, stage 3a: Secondary | ICD-10-CM

## 2021-07-04 MED ORDER — METFORMIN HCL ER 500 MG PO TB24: 500.0000 mg | ORAL_TABLET | Freq: Two times a day (BID) | ORAL | 1 refills | Status: DC

## 2021-07-04 NOTE — Patient Instructions (Signed)
Mr. Latin,   Keep up the great work!  Try and scan at least every 8 hours, if not more frequently.   Make sure you schedule your yearly diabetic eye exam. Have those results sent to our office.   You don't have to be completely ready to quit smoking to start Chantix. Consider support from the [employer] (1-800-QUIT-NOW). They offer supportive resources to help patients who are contemplating quitting smoking.   Take care!  Catie Darnelle Maffucci, PharmD  Visit Information  Following are the goals we discussed today:  Patient Goals/Self-Care Activities Over the next 90 days, patient will:  - take medications as prescribed check glucose at least 3 times daily using CGM, document, and provide at future appointments collaborate with provider on medication access solutions engage in dietary modifications by reducing carbohydrate portion sizes Focus on tobacco cessation        Plan: Face to Face appointment with care management team member scheduled for: 12 weeks   Catie Darnelle Maffucci, PharmD, [place], CPP Clinical Pharmacist [place] at [facility] [phone]   Please call the care guide team at [phone] if you need to cancel or reschedule your appointment.   Patient verbalizes understanding of instructions and care plan provided today and agrees to view in [place]. Active MyChart status confirmed with patient.

## 2021-07-04 NOTE — Chronic Care Management (AMB) (Signed)
Chronic Care Management CCM Pharmacy Note  07/04/2021 Name:  James Hood MRN:  [id number] DOB:  1941/02/21  Summary: - Some diarrhea with IR metformin.  - Improvement in glucose readings - Not ready to attempt to quit smoking yet  Recommendations/Changes made from today's visit: - Stop IR metformin, start metformin XR 500 mg twice daily.    Subjective: James Hood is an 81 y.o. year old male who is a primary patient of Caryl Bis, Angela Adam, MD.  The CCM team was consulted for assistance with disease management and care coordination needs.    Engaged with patient by telephone for follow up visit for pharmacy case management and/or care coordination services.   Objective:  Medications Reviewed Today     Reviewed by Al Decant, RN (Registered Nurse) on 06/29/21 at Box List Status: <None>   Medication Order Taking? Sig Documenting Provider Last Dose Status Informant  aspirin EC 81 MG tablet [id number]  Take 81 mg by mouth daily. [provider]  Active            Med Note Vanessa [place], SUSAN   Tue Apr 15, 2018  8:47 AM)    budesonide (ENTOCORT EC) 3 MG 24 hr capsule [id number]  Take 6 mg by mouth daily. [provider]  Active   buPROPion (WELLBUTRIN XL) 300 MG 24 hr tablet [id number]  Take 1 tablet (300 mg total) by mouth daily. Norman Clay, MD  Active   Continuous Blood Gluc Sensor (FREESTYLE LIBRE 14 DAY SENSOR) MISC [id number]  APPLY ONE SENSOR EVERY 14 DAYS TO MONITOR BLOOD GLUCOSE LEVELS. [place] Leone Haven, MD  Active   dapagliflozin propanediol (FARXIGA) 10 MG TABS tablet [id number]  Take 1 tablet (10 mg total) by mouth daily. Leone Haven, MD  Active   divalproex (DEPAKOTE ER) 500 MG 24 hr tablet [id number]  Take 1 tablet by mouth 2 (two) times daily. [provider]  Active   donepezil (ARICEPT) 10 MG tablet [id number]  Take 10 mg by mouth at bedtime. [provider]  Active Self   doxycycline (VIBRA-TABS) 100 MG tablet [id number]  Take 1 tablet (100 mg total) by mouth 2 (two) times daily. [name] B, FNP  Active   fenofibrate (TRICOR) 145 MG tablet [id number]  TAKE ONE TABLET BY MOUTH DAILY [name] B, FNP  Active   ferrous sulfate 325 (65 FE) MG EC tablet [id number]  Take 325 mg by mouth daily. [provider]  Active   gabapentin (NEURONTIN) 600 MG tablet [id number]  TAKE TWO TABLETS BY MOUTH THREE TIMES A DAY Leone Haven, MD  Active   HYDROcodone-acetaminophen [place]) 10-325 MG tablet [id number]  Take 1 tablet by mouth every 4 (four) hours as needed for severe pain. Must last 30 days. [name], MD  Active   Hyoscyamine Sulfate SL 0.125 MG SUBL [id number]  Place 1 tablet under the tongue daily as needed. [provider]  Active   ipratropium (ATROVENT) 0.03 % nasal spray [id number]  Place 2 sprays into both nostrils every 12 (twelve) hours. Leone Haven, MD  Active   lisinopril-hydrochlorothiazide (ZESTORETIC) 20-25 MG tablet [id number]  Take 1 tablet by mouth daily. Leone Haven, MD  Active   memantine [facility]) 10 MG tablet [id number]  Take 1 tablet by mouth daily. [provider]  Active   metFORMIN (GLUCOPHAGE) 500 MG tablet [id number]  Take 1 tablet (500 mg total) by mouth 2 (two)  times daily with a meal. Leone Haven, MD  Active   metoprolol succinate (TOPROL-XL) 25 MG 24 hr tablet [id number]  Take 25 mg by mouth daily. [provider]  Active   Multiple Vitamins-Minerals (CENTRUM SILVER PO) [id number]  Take 1 tablet by mouth daily. [provider]  Active   omega-3 acid ethyl esters (LOVAZA) 1 g capsule [id number]  Take 2 capsules (2 g total) by mouth 2 (two) times daily. Leone Haven, MD  Active   omeprazole (PRILOSEC) 20 MG capsule [id number]  Take 20 mg by mouth daily. [provider]  Active Self           Med Note Josiah Lobo, KIMBERLY   Thu Aug 09, 2016 12:05 PM)    primidone  (MYSOLINE) 50 MG tablet [id number]  Take 150-250 mg by mouth 2 (two) times daily. Take 250 mg by mouth in the morning & take 150 mg at night. [provider]  Active Self  promethazine-dextromethorphan (PROMETHAZINE-DM) 6.25-15 MG/5ML syrup [id number]  Take 5 mLs by mouth 4 (four) times daily as needed for cough. [name] B, FNP  Active   QUEtiapine (SEROQUEL) 100 MG tablet [id number]  Take 150 mg by mouth at bedtime. [provider]  Active Pharmacy Records  Semaglutide,0.25 or 0.5MG /DOS, (OZEMPIC, 0.25 OR 0.5 MG/DOSE,) 2 MG/1.5ML SOPN [id number]  Inject 0.5 mg into the skin once a week. Leone Haven, MD  Active Pharmacy Records  simvastatin (ZOCOR) 40 MG tablet [id number]  Take 40 mg by mouth daily. [provider]  Active   tamsulosin (FLOMAX) 0.4 MG CAPS capsule [id number]  TAKE ONE CAPSULE BY MOUTH DAILY AFTER Burnett Harry, MD  Active   traZODone (DESYREL) 100 MG tablet [id number]  Take 2 tablets (200 mg total) by mouth at bedtime as needed for sleep. Norman Clay, MD  Active   varenicline (CHANTIX) 1 MG tablet [id number]  Take 1/2 tablet by mouth daily for 3 days, then 1/2 tablet by mouth twice daily for 4 days, then 1 tablet twice daily thereafter. Take with food. Leone Haven, MD  Active   vitamin C (ASCORBIC ACID) 500 MG tablet [id number]  Take 500 mg by mouth daily. [provider]  Active   Med List Note Landis Martins, RN 03/29/21 1357): MR 07-05-21 UDS 11/15/2020            Pertinent Labs:   Lab Results  Component Value Date   HGBA1C 7.5 (H) 05/10/2021   Lab Results  Component Value Date   CHOL 99 07/05/2020   HDL 43.20 07/05/2020   LDLCALC 57 09/25/2017   LDLDIRECT 31.0 07/05/2020   TRIG 226.0 (H) 07/05/2020   CHOLHDL 2 07/05/2020   Lab Results  Component Value Date   CREATININE 1.17 05/10/2021   BUN 26 (H) 05/10/2021   NA 133 (L) 05/10/2021   K 4.4 05/10/2021   CL 97 05/10/2021   CO2 29 05/10/2021     SDOH:  (Social Determinants of Health) assessments and interventions performed:  SDOH Interventions    Flowsheet Row Most Recent Value  SDOH Interventions   Financial Strain Interventions Other (Comment)  [manufacturer assistance]       CCM Care Plan  Review of patient past medical history, allergies, medications, health status, including review of consultants reports, laboratory and other test data, was performed as part of comprehensive evaluation and provision of chronic care management services.   Care Plan : Medication Management  Updates made by Alphonzo Severance  E, RPH-CPP since 07/04/2021 12:00 AM     Problem: Diabetes, Depression      Long-Range Goal: Disease Progression Prevention   Start Date: 02/16/2020  Recent Progress: On track  Priority: Medium  Note:   Current Barriers:  Unable to independently afford treatment regimen  Pharmacist Clinical Goal(s):  Over the next 30 days, patient will verbalize ability to afford treatment regimen through collaboration with PharmD and provider.  Over the next 90 days, patient will maintain glycemic control as evidenced by A1c through collaboration with PharmD and provider.  Interventions: 1:1 collaboration with Leone Haven, MD regarding development and update of comprehensive plan of care as evidenced by provider attestation and co-signature Inter-disciplinary care team collaboration (see longitudinal plan of care) Comprehensive medication review performed; medication list updated in electronic medical record  Health Maintenance   Yearly diabetic eye exam: due Yearly diabetic foot exam: up to date Urine microalbumin: up to date Yearly influenza vaccination: up to date Td/Tdap vaccination: up to date Pneumonia vaccination: up to date COVID vaccinations: due Shingrix vaccinations: up to date Colonoscopy: up to date  Diabetes: Uncontrolled; current treatment: Farxiga 10 mg daily; Ozempic 0.75 mg weekly (max  tolerated dose d/t GI upset/weight loss at 1 mg weekly) - missed dose this past weekend. Reports he is tolerating this dose; metformin 500 mg BID Does report periodic diarrhea. Upon chart review today, appears he had previously been on IR metformin as well Approved for Ozempic and Farxiga assistance through 2023 Current glucose readings: using Libre 2  Date of Download: 2/1-2/14/23 % Time CGM is active: 62% Average Glucose: 161 mg/dL - midnight - 6 am: 148 - 6 am - noon: 159 - noon - 6 pm: 170 - 6 pm - midnight: 174 Time in Goal:  - Time in range 70-180: 77% - Time above range: 23% - Time below range: 0% Will switch metformin to XR instead of IR. Continue Ozempic at inbetween dose of ~ 0.75 mg weekly, Farxiga 10 mg daily. Follow up with PCP in clinic in ~ 6 weeks  Hypertension: Controlled per last clinic reading; current treatment: lisinopril/HCTZ 20/25 mg daily, metoprolol succinate 25 mg daily Previously recommended to continue current regimen at this time  Hyperlipidemia: Controlled; current treatment: rosuvastatin 40 mg daily, fenofibrate 145 mg daily, OTC omega 3 fatty acids 2 g BID Will discuss [place] moving forward.   Depression/insomnia: Improved, sleep improved per patient report. Current regimen: bupropion XL 300 mg daily, trazodone 200 mg QPM. Follows w/ Dr. Modesta Messing and LCSW Psychiatry previously discussed reducing dose of quetiapine to reduce risk of EPS. Patient declined.  Previously recommended to continue current collaboration with psychiatry.   Chronic Pain: Worsened; hydrocodone/APAP 10/325 mg up to TID, gabapentin 1200 mg TID; Follows w/ Dr. Holley Raring. Reports upcoming evaluations to determine if ablation an option Previously recommended to continue current regimen at this time as prescribed  Neurologic Conditions (pseudo dementia, tremor, sleep disorder) Well managed per patient report; current regimen: memantine 10 mg BID, donepezil 10 mg  daily; primidone 250 mg QAM, 150 mg QPM; divalproex 500 mg BID, quetiapine 150 mg QPM; Follows w/ Dr. Melrose Nakayama.  Previously recommended to continue current regimen at this time along with collaboration w/ Neurology  Chronic Diarrhea/Colitis, Barrett's: Controlled per patient report; current regimen: budesonide 6 mg daily, Lomotil 1 tab QAM, hyoscyamine PRN added at last visit; follows w/ [facility] Barrett's: omeprazole 20 mg daily Previously recommended to continue current regimen at this time  OAB/BPH: Uncontrolled,  notes some continued incomplete emptying and increased urinary frequency; current treatment regimen: tamsulosin 0.4 mg daily; follows w/ Dr. Diamantina Providence Previously recommended to continue current regimen at this time and continue to communicate with urology if further treatment is needed.   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 Currently unsuccessful on bupropion. Tier Exception for varenicline was approved, however, patient has not started yet. Reiterated that he does not have to be ready to quit smoking to start varenicline, as it may help make him more ready to quit. He verbalizes understanding.   Patient Goals/Self-Care Activities Over the next 90 days, patient will:  - take medications as prescribed check glucose at least 3 times daily using CGM, document, and provide at future appointments collaborate with provider on medication access solutions engage in dietary modifications by reducing carbohydrate portion sizes Focus on tobacco cessation      Plan: Face to Face appointment with care management team member scheduled for: 12 weeks  Catie Darnelle Maffucci, PharmD, [place], [place] Pharmacist [employer] at [employer] [phone]

## 2021-07-11 ENCOUNTER — Telehealth: Payer: Self-pay

## 2021-07-11 NOTE — Telephone Encounter (Signed)
I called the patient and LVM informing him that his Ozempic has arrived and is available for pickup. Dahlila Pfahler,cma

## 2021-07-12 ENCOUNTER — Ambulatory Visit
Admission: RE | Admit: 2021-07-12 | Discharge: 2021-07-12 | Disposition: A | Discharge status: Home / Self Care | Payer: PPO | Source: Ambulatory Visit | Attending: Student in an Organized Health Care Education/Training Program | Admitting: Student in an Organized Health Care Education/Training Program

## 2021-07-12 ENCOUNTER — Encounter: Payer: Self-pay | Admitting: Student in an Organized Health Care Education/Training Program

## 2021-07-12 ENCOUNTER — Ambulatory Visit (HOSPITAL_BASED_OUTPATIENT_CLINIC_OR_DEPARTMENT_OTHER): Payer: PPO | Admitting: Student in an Organized Health Care Education/Training Program

## 2021-07-12 ENCOUNTER — Other Ambulatory Visit: Payer: Self-pay

## 2021-07-12 VITALS — BP 106/68 | HR 84 | Temp 97.1°F | Resp 16 | Ht 70.0 in | Wt 175.0 lb

## 2021-07-12 DIAGNOSIS — M47816 Spondylosis without myelopathy or radiculopathy, lumbar region: Secondary | ICD-10-CM | POA: Diagnosis not present

## 2021-07-12 DIAGNOSIS — G894 Chronic pain syndrome: Secondary | ICD-10-CM | POA: Diagnosis not present

## 2021-07-12 MED ORDER — DEXAMETHASONE SODIUM PHOSPHATE 10 MG/ML IJ SOLN
INTRAMUSCULAR | Status: AC
  Filled 2021-07-12: qty 1

## 2021-07-12 MED ORDER — ROPIVACAINE HCL 2 MG/ML IJ SOLN
9.0000 mL | Freq: Once | INTRAMUSCULAR | Status: AC
  Administered 2021-07-12: 9 mL via PERINEURAL

## 2021-07-12 MED ORDER — LIDOCAINE HCL 2 % IJ SOLN
INTRAMUSCULAR | Status: AC
  Filled 2021-07-12: qty 20

## 2021-07-12 MED ORDER — MIDAZOLAM HCL 5 MG/5ML IJ SOLN
0.5000 mg | Freq: Once | INTRAMUSCULAR | Status: AC
  Administered 2021-07-12: 1 mg via INTRAVENOUS

## 2021-07-12 MED ORDER — LIDOCAINE HCL 2 % IJ SOLN
20.0000 mL | Freq: Once | INTRAMUSCULAR | Status: AC
  Administered 2021-07-12: 400 mg

## 2021-07-12 MED ORDER — ROPIVACAINE HCL 2 MG/ML IJ SOLN
INTRAMUSCULAR | Status: AC
  Filled 2021-07-12: qty 20

## 2021-07-12 MED ORDER — DEXAMETHASONE SODIUM PHOSPHATE 10 MG/ML IJ SOLN
10.0000 mg | Freq: Once | INTRAMUSCULAR | Status: AC
  Administered 2021-07-12: 10 mg

## 2021-07-12 MED ORDER — IOHEXOL 180 MG/ML  SOLN: 10.0000 mL | Freq: Once | INTRAMUSCULAR | Status: DC

## 2021-07-12 MED ORDER — MIDAZOLAM HCL 5 MG/5ML IJ SOLN
INTRAMUSCULAR | Status: AC
  Filled 2021-07-12: qty 5

## 2021-07-12 NOTE — Patient Instructions (Signed)
Pain Management Discharge Instructions  General Discharge Instructions :  If you need to reach your doctor call: Monday-Friday 8:00 am - 4:00 pm at [phone] or toll free [phone].  After clinic hours [phone] to have operator reach doctor.  Bring all of your medication bottles to all your appointments in the pain clinic.  To cancel or reschedule your appointment with Pain Management please remember to call 24 hours in advance to avoid a fee.  Refer to the educational materials which you have been given on: General Risks, I had my Procedure. Discharge Instructions, Post Sedation.  Post Procedure Instructions:  The drugs you were given will stay in your system until tomorrow, so for the next 24 hours you should not drive, make any legal decisions or drink any alcoholic beverages.  You may eat anything you prefer, but it is better to start with liquids then soups and crackers, and gradually work up to solid foods.  Please notify your doctor immediately if you have any unusual bleeding, trouble breathing or pain that is not related to your normal pain.  Depending on the type of procedure that was done, some parts of your body may feel week and/or numb.  This usually clears up by tonight or the next day.  Walk with the use of an assistive device or accompanied by an adult for the 24 hours.  You may use ice on the affected area for the first 24 hours.  Put ice in a Ziploc bag and cover with a towel and place against area 15 minutes on 15 minutes off.  You may switch to heat after 24 hours.Facet Blocks Patient Information  Description: The facets are joints in the spine between the vertebrae.  Like any joints in the body, facets can become irritated and painful.  Arthritis can also effect the facets.  By injecting steroids and local anesthetic in and around these joints, we can temporarily block the nerve supply to them.  Steroids act directly on irritated nerves and tissues to  reduce selling and inflammation which often leads to decreased pain.  Facet blocks may be done anywhere along the spine from the neck to the low back depending upon the location of your pain.   After numbing the skin with local anesthetic (like Novocaine), a small needle is passed onto the facet joints under x-ray guidance.  You may experience a sensation of pressure while this is being done.  The entire block usually lasts about 15-25 minutes.   Conditions which may be treated by facet blocks:  Low back/buttock pain Neck/shoulder pain Certain types of headaches  Preparation for the injection:  Do not eat any solid food or dairy products within 8 hours of your appointment. You may drink clear liquid up to 3 hours before appointment.  Clear liquids include water, black coffee, juice or soda.  No milk or cream please. You may take your regular medication, including pain medications, with a sip of water before your appointment.  Diabetics should hold regular insulin (if taken separately) and take 1/2 normal NPH dose the morning of the procedure.  Carry some sugar containing items with you to your appointment. A driver must accompany you and be prepared to drive you home after your procedure. Bring all your current medications with you. An IV may be inserted and sedation may be given at the discretion of the physician. A blood pressure cuff, EKG and other monitors will often be applied during the procedure.  Some patients may need to   have extra oxygen administered for a short period. You will be asked to provide medical information, including your allergies and medications, prior to the procedure.  We must know immediately if you are taking blood thinners (like Coumadin/Warfarin) or if you are allergic to IV iodine contrast (dye).  We must know if you could possible be pregnant.  Possible side-effects:  Bleeding from needle site Infection (rare, may require surgery) Nerve injury (rare) Numbness  & tingling (temporary) Difficulty urinating (rare, temporary) Spinal headache (a headache worse with upright posture) Light-headedness (temporary) Pain at injection site (serveral days) Decreased blood pressure (rare, temporary) Weakness in arm/leg (temporary) Pressure sensation in back/neck (temporary)   Call if you experience:  Fever/chills associated with headache or increased back/neck pain Headache worsened by an upright position New onset, weakness or numbness of an extremity below the injection site Hives or difficulty breathing (go to the emergency room) Inflammation or drainage at the injection site(s) Severe back/neck pain greater than usual New symptoms which are concerning to you  Please note:  Although the local anesthetic injected can often make your back or neck feel good for several hours after the injection, the pain will likely return. It takes 3-7 days for steroids to work.  You may not notice any pain relief for at least one week.  If effective, we will often do a series of 2-3 injections spaced 3-6 weeks apart to maximally decrease your pain.  After the initial series, you may be a candidate for a more permanent nerve block of the facets.  If you have any questions, please call [phone]  [facility] 

## 2021-07-12 NOTE — Progress Notes (Signed)
PROVIDER NOTE: Interpretation of information contained herein should be left to medically-trained personnel. Specific patient instructions are provided elsewhere under "Patient Instructions" section of medical record. This document was created in part using STT-dictation technology, any transcriptional errors that may result from this process are unintentional.  Patient: James Hood Type: Established DOB: 02/28/1941 MRN: [id number] PCP: James Haven, MD  Service: Procedure DOS: 07/12/2021 Setting: Ambulatory Location: Ambulatory outpatient facility Delivery: Face-to-face Provider: [name], MD Specialty: Interventional Pain Management Specialty designation: 09 Location: Outpatient facility Ref. Prov.: James Haven, MD    Primary Reason for Visit: Interventional Pain Management Treatment. CC: Back Pain (both)   Procedure:           Type: Lumbar Facet, Medial Branch Block(s) #1  Laterality: Bilateral  Level:L3, L4, L5, Medial Branch Level(s). Injecting these levels blocks the L3-4 and L5-S1 lumbar facet joints. Imaging: Fluoroscopic guidance Anesthesia: Local anesthesia (1-2% Lidocaine) Anxiolysis: IV Valium 1.0 mg Sedation:  minimal . DOS: 07/12/2021 Performed by: [name], MD  Primary Purpose: Diagnostic/Therapeutic Indications: Low back pain severe enough to impact quality of life or function. 1. Lumbar facet joint syndrome   2. Facet arthritis of lumbar region   3. Chronic pain syndrome    NAS-11 Pain score:   Pre-procedure: 3 /10   Post-procedure: 2 /10     Position / Prep / Materials:  Position: Prone  Prep solution: DuraPrep (Iodine Povacrylex [0.7% available iodine] and Isopropyl Alcohol, 74% w/w) Area Prepped: Posterolateral Lumbosacral Spine (Wide prep: From the lower border of the scapula down to the end of the tailbone and from flank to flank.)  Materials:  Tray: Block Needle(s):  Type: Spinal  Gauge (G): 22  Length: 5-in Qty:  3  Pre-op H&P Assessment:  James Hood is a 81 y.o. (year old), male patient, seen today for interventional treatment. 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 James Hood has a current medication list which includes the following prescription(s): aspirin ec, budesonide, bupropion, freestyle libre 14 day sensor, dapagliflozin propanediol, divalproex, donepezil, doxycycline, fenofibrate, ferrous sulfate, gabapentin, hydrocodone-acetaminophen, [START ON 08/04/2021] hydrocodone-acetaminophen, [START ON 09/03/2021] hydrocodone-acetaminophen, hyoscyamine sulfate sl, ipratropium, lisinopril-hydrochlorothiazide, memantine, metformin, metoprolol succinate, multiple vitamins-minerals, omega-3 acid ethyl esters, omeprazole, primidone, promethazine-dextromethorphan, quetiapine, ozempic (0.25 or 0.5 mg/dose), simvastatin, tamsulosin, trazodone, varenicline, and vitamin c, and the following Facility-Administered Medications: iohexol. His primarily concern today is the Back Pain (both)  Initial Vital Signs:  Pulse/HCG Rate: 91  Temp:  (!) 97.1 F (36.2 C) Resp: 13 BP:  (!) 84/44 SpO2: 95 %  BMI: Estimated body mass index is 25.11 kg/m as calculated from the following:   Height as of this encounter: 5\' 10"  (1.778 m).   Weight as of this encounter: 175 lb (79.4 kg).  Risk Assessment: Allergies: Reviewed. He has No Known Allergies.  Allergy Precautions: None required Coagulopathies: Reviewed. None identified.  Blood-thinner therapy: None at this time Active Infection(s): Reviewed.  None identified. James Hood is afebrile  Site Confirmation: James Hood was asked to confirm the procedure and laterality before marking the site Procedure checklist: Completed Consent: Before the procedure and under the influence of no sedative(s), amnesic(s), or anxiolytics, the patient was informed of the treatment options, risks and possible complications.  To fulfill our ethical and legal obligations, as recommended by the American Medical Association's Code of Ethics, I have informed the patient of my clinical impression; the nature and purpose of the treatment or procedure; the risks, benefits, and possible complications of the intervention; the alternatives, including doing nothing; the risk(s) and benefit(s) of the alternative treatment(s) or procedure(s); and the risk(s) and benefit(s) of doing nothing. The patient was provided information about the general risks and possible complications associated with the procedure. These may include, but are not limited to: failure to achieve desired goals, infection, bleeding, organ or nerve damage, allergic reactions, paralysis, and death. In addition, the patient was informed of those risks and complications associated to Spine-related procedures, such as failure to decrease pain; infection (i.e.: Meningitis, epidural or intraspinal abscess); bleeding (i.e.: epidural hematoma, subarachnoid hemorrhage, or any other type of intraspinal or peri-dural bleeding); organ or nerve damage (i.e.: Any type of peripheral nerve, nerve root, or spinal cord injury) with subsequent damage to sensory, motor, and/or autonomic systems, resulting in permanent pain, numbness, and/or weakness of one or several areas of the body; allergic reactions; (i.e.: anaphylactic reaction); and/or death. Furthermore, the patient was informed of those risks and complications associated with the medications. These include, but are not limited to: allergic reactions (i.e.: anaphylactic or  anaphylactoid reaction(s)); adrenal axis suppression; blood sugar elevation that in diabetics may result in ketoacidosis or comma; water retention that in patients with history of congestive heart failure may result in shortness of breath, pulmonary edema, and decompensation with resultant heart failure; weight gain; swelling or edema; medication-induced neural toxicity; particulate matter embolism and blood vessel occlusion with resultant organ, and/or nervous system infarction; and/or aseptic necrosis of one or more joints. Finally, the patient was informed that Medicine is not an exact science; therefore, there is also the possibility of unforeseen or unpredictable risks and/or possible complications that may result in a catastrophic outcome. The patient indicated having understood very clearly. We have given the patient no guarantees and we have made no promises. Enough time was given to the patient to ask questions, all of which were answered to the patient's satisfaction. Mr. Edmondson has indicated that he wanted to continue with the procedure. Attestation: I, the ordering provider, attest that I have discussed with the patient the benefits, risks, side-effects, alternatives, likelihood of achieving goals, and potential problems during recovery for the procedure that I have provided informed consent. Date   Time: 07/12/2021  9:25 AM  Pre-Procedure Preparation:  Monitoring: As per clinic protocol. Respiration, ETCO2, SpO2, BP, heart rate and rhythm monitor placed and checked for adequate function Safety Precautions: Patient was assessed for positional comfort and pressure points before starting the procedure. Time-out: I initiated and conducted the "Time-out" before starting the procedure, as per protocol. The patient was asked to participate by confirming the accuracy of the "Time Out" information. Verification of the correct person, site, and procedure were performed and confirmed by me, the nursing  staff, and the patient. "Time-out" conducted as per Joint Commission's Universal Protocol (UP.01.01.01). Time: 0957  Description of Procedure:          Laterality: Bilateral. The procedure was performed in identical fashion on both sides. Targeted Levels: L3, L4, L5,Medial Branch Level(s)  Safety Precautions: Aspiration looking for blood return was conducted prior to all injections. At no point did we inject any substances, as a needle was being advanced. Before injecting, the patient was told to immediately notify me if he was experiencing any new  onset of "ringing in the ears, or metallic taste in the mouth". No attempts were made at seeking any paresthesias. Safe injection practices and needle disposal techniques used. Medications properly checked for expiration dates. SDV (single dose vial) medications used. After the completion of the procedure, all disposable equipment used was discarded in the proper designated medical waste containers. Local Anesthesia: Protocol guidelines were followed. The patient was positioned over the fluoroscopy table. The area was prepped in the usual manner. The time-out was completed. The target area was identified using fluoroscopy. A 12-in long, straight, sterile hemostat was used with fluoroscopic guidance to locate the targets for each level blocked. Once located, the skin was marked with an approved surgical skin marker. Once all sites were marked, the skin (epidermis, dermis, and hypodermis), as well as deeper tissues (fat, connective tissue and muscle) were infiltrated with a small amount of a short-acting local anesthetic, loaded on a 10cc syringe with a 25G, 1.5-in  Needle. An appropriate amount of time was allowed for local anesthetics to take effect before proceeding to the next step. Local Anesthetic: Lidocaine 2.0% The unused portion of the local anesthetic was discarded in the proper designated containers. Technical description of process:  L3 Medial Branch  Nerve Block (MBB): The target area for the L3 medial branch is at the junction of the postero-lateral aspect of the superior articular process and the superior, posterior, and medial edge of the transverse process of L4. Under fluoroscopic guidance, a Quincke needle was inserted until contact was made with os over the superior postero-lateral aspect of the pedicular shadow (target area). After negative aspiration for blood, 61mL of the nerve block solution was injected without difficulty or complication. The needle was removed intact. L4 Medial Branch Nerve Block (MBB): The target area for the L4 medial branch is at the junction of the postero-lateral aspect of the superior articular process and the superior, posterior, and medial edge of the transverse process of L5. Under fluoroscopic guidance, a Quincke needle was inserted until contact was made with os over the superior postero-lateral aspect of the pedicular shadow (target area). After negative aspiration for blood, 48mL of the nerve block solution was injected without difficulty or complication. The needle was removed intact. L5 Medial Branch Nerve Block (MBB): The target area for the L5 medial branch is at the junction of the postero-lateral aspect of the superior articular process and the superior, posterior, and medial edge of the sacral ala. Under fluoroscopic guidance, a Quincke needle was inserted until contact was made with os over the superior postero-lateral aspect of the pedicular shadow (target area). After negative aspiration for blood, 72mL of the nerve block solution was injected without difficulty or complication. The needle was removed intact.   12 cc solution made of 10 cc of 0.2% ropivacaine, 2 cc of Decadron 10 mg/cc.  2 cc injected actual above bilaterally.  Once the entire procedure was completed, the treated area was cleaned, making sure to leave some of the prepping solution back to take advantage of its long term bactericidal  properties.      Illustration of the posterior view of the lumbar spine and the posterior neural structures. Laminae of L2 through S1 are labeled. DPRL5, dorsal primary ramus of L5; DPRS1, dorsal primary ramus of S1; DPR3, dorsal primary ramus of L3; FJ, facet (zygapophyseal) joint L3-L4; I, inferior articular process of L4; LB1, lateral branch of dorsal primary ramus of L1; IAB, inferior articular branches from L3 medial branch (supplies L4-L5 facet  joint); IBP, intermediate branch plexus; MB3, medial branch of dorsal primary ramus of L3; NR3, third lumbar nerve root; S, superior articular process of L5; SAB, superior articular branches from L4 (supplies L4-5 facet joint also); TP3, transverse process of L3.  Vitals:   07/12/21 0955 07/12/21 1000 07/12/21 1005 07/12/21 1010  BP: 102/65 104/60  106/68  Pulse: 83 81 78 84  Resp: 14 16 14 16   Temp:    (!) 97.1 F (36.2 C)  SpO2: 96% 97% 98% 97%  Weight:      Height:         Start Time: 0957 hrs. End Time: 1005 hrs.  Imaging Guidance (Spinal):          Type of Imaging Technique: Fluoroscopy Guidance (Spinal) Indication(s): Assistance in needle guidance and placement for procedures requiring needle placement in or near specific anatomical locations not easily accessible without such assistance. Exposure Time: Please see nurses notes. Contrast: None used. Fluoroscopic Guidance: I was personally present during the use of fluoroscopy. "Tunnel Vision Technique" used to obtain the best possible view of the target area. Parallax error corrected before commencing the procedure. "Direction-depth-direction" technique used to introduce the needle under continuous pulsed fluoroscopy. Once target was reached, antero-posterior, oblique, and lateral fluoroscopic projection used confirm needle placement in all planes. Images permanently stored in EMR. Interpretation: No contrast injected. I personally interpreted the imaging intraoperatively. Adequate  needle placement confirmed in multiple planes. Permanent images saved into the patient's record.   Post-operative Assessment:  Post-procedure Vital Signs:  Pulse/HCG Rate: 84  Temp:  (!) 97.1 F (36.2 C) Resp: 16 BP: 106/68 SpO2: 97 %  EBL: None  Complications: No immediate post-treatment complications observed by team, or reported by patient.  Note: The patient tolerated the entire procedure well. A repeat set of vitals were taken after the procedure and the patient was kept under observation following institutional policy, for this type of procedure. Post-procedural neurological assessment was performed, showing return to baseline, prior to discharge. The patient was provided with post-procedure discharge instructions, including a section on how to identify potential problems. Should any problems arise concerning this procedure, the patient was given instructions to immediately contact us, at any time, without hesitation. In any case, we plan to contact the patient by telephone for a follow-up status report regarding this interventional procedure.  Comments:  No additional relevant information.  Plan of Care  Orders:  Orders Placed This Encounter  Procedures   DG PAIN CLINIC C-ARM 1-60 MIN NO REPORT    Intraoperative interpretation by procedural physician at [place].    Standing Status:   Standing    Number of Occurrences:   1    Order Specific Question:   Reason for exam:    Answer:   Assistance in needle guidance and placement for procedures requiring needle placement in or near specific anatomical locations not easily accessible without such assistance.   Chronic Opioid Analgesic:  Hydrocodone 10 mg every 4 hours as needed, quantity 180/month; MME equals 60    Medications ordered for procedure: Meds ordered this encounter  Medications   iohexol (OMNIPAQUE) 180 MG/ML injection 10 mL    Must be Myelogram-compatible. If not available, you may substitute with a  water-soluble, non-ionic, hypoallergenic, myelogram-compatible radiological contrast medium.   lidocaine (XYLOCAINE) 2 % (with pres) injection 400 mg   midazolam (VERSED) 5 MG/5ML injection 0.5-2 mg    Make sure Flumazenil is available in the pyxis when using this medication. If oversedation  occurs, administer 0.2 mg IV over 15 sec. If after 45 sec no response, administer 0.2 mg again over 1 min; may repeat at 1 min intervals; not to exceed 4 doses (1 mg)   dexamethasone (DECADRON) injection 10 mg   dexamethasone (DECADRON) injection 10 mg   ropivacaine (PF) 2 mg/mL (0.2%) (NAROPIN) injection 9 mL   ropivacaine (PF) 2 mg/mL (0.2%) (NAROPIN) injection 9 mL   Medications administered: We administered lidocaine, midazolam, dexamethasone, dexamethasone, ropivacaine (PF) 2 mg/mL (0.2%), and ropivacaine (PF) 2 mg/mL (0.2%).  See the medical record for exact dosing, route, and time of administration.  Follow-up plan:   Return in about 4 weeks (around 08/09/2021) for Post Procedure Evaluation, virtual.      Recent Visits Date Type Provider Dept  06/29/21 Office Visit [name], MD [facility]  Showing recent visits within past 90 days and meeting all other requirements Today's Visits Date Type Provider Dept  07/12/21 Procedure visit [name], MD [facility]  Showing today's visits and meeting all other requirements Future Appointments Date Type Provider Dept  08/09/21 Appointment [name], MD [facility]  09/26/21 Appointment [name], MD [facility]  Showing future appointments within next 90 days and meeting all other requirements  Disposition: Discharge home  Discharge (Date   Time): 07/12/2021; 1015 hrs.   Primary Care Physician: James Haven, MD Location: [facility] Note by: [name], MD Date: 07/12/2021; Time: 10:24 AM  Disclaimer:  Medicine is not an exact science. The only guarantee  in medicine is that nothing is guaranteed. It is important to note that the decision to proceed with this intervention was based on the information collected from the patient. The Data and conclusions were drawn from the patient's questionnaire, the interview, and the physical examination. Because the information was provided in large part by the patient, it cannot be guaranteed that it has not been purposely or unconsciously manipulated. Every effort has been made to obtain as much relevant data as possible for this evaluation. It is important to note that the conclusions that lead to this procedure are derived in large part from the available data. Always take into account that the treatment will also be dependent on availability of resources and existing treatment guidelines, considered by other Pain Management Practitioners as being common knowledge and practice, at the time of the intervention. For Medico-Legal purposes, it is also important to point out that variation in procedural techniques and pharmacological choices are the acceptable norm. The indications, contraindications, technique, and results of the above procedure should only be interpreted and judged by a Board-Certified Interventional Pain Specialist with extensive familiarity and expertise in the same exact procedure and technique.

## 2021-07-13 ENCOUNTER — Ambulatory Visit (INDEPENDENT_AMBULATORY_CARE_PROVIDER_SITE_OTHER): Payer: PPO | Admitting: Licensed Clinical Social Worker

## 2021-07-13 ENCOUNTER — Telehealth: Payer: Self-pay

## 2021-07-13 DIAGNOSIS — F3342 Major depressive disorder, recurrent, in full remission: Secondary | ICD-10-CM | POA: Diagnosis not present

## 2021-07-13 DIAGNOSIS — E1142 Type 2 diabetes mellitus with diabetic polyneuropathy: Secondary | ICD-10-CM

## 2021-07-13 MED ORDER — OZEMPIC (1 MG/DOSE) 4 MG/3ML ~~LOC~~ SOPN: 1.0000 mg | PEN_INJECTOR | SUBCUTANEOUS | 1 refills | Status: DC

## 2021-07-13 NOTE — Telephone Encounter (Signed)
PT PICKED UP OZEMPIC

## 2021-07-13 NOTE — Progress Notes (Signed)
° °  THERAPIST PROGRESS NOTE  Session Time: 8-9am  Participation Level: Active  Behavioral Response: Neat and Well GroomedAlertAnxious  Type of Therapy: Individual Therapy  Treatment Goals addressed:  Problem: Decrease depressive symptoms and improve levels of effective functioning Goal: LTG: Reduce frequency, intensity, and duration of depression symptoms as evidenced by: pt self report Outcome: Progressing  ProgressTowards Goals: Progressing  Interventions: CBT  Summary: James Hood is a 81 y.o. male who presents with improving symptoms related to his depression diagnosis. Pt reports that he does have some anxiety associated with illness of his grandson, Bland Span.   Allowed pt to explore and express thoughts and feelings associated with recent life situations and external stressors. Discussed pt continuing to be in the home alone while his wife works to homeschool her grandson 2 x week in his home and 2 x wk in their home. Pt is still very active in [place] groups and meets regularly with his grandson to have breakfast at [facility]. Pt reports that he is trying to limit engagement with activities that trigger anxiety, such as watching the news for too long. Discussed health related concerns that pt has and chronic pain.  Allowed pt to identify pain triggers and strategies of managing chronic pain.   Continued recommendations are as follows: self care behaviors, positive social engagements, focusing on overall work/home/life balance, and focusing on positive physical and emotional wellness.    Suicidal/Homicidal: No  Therapist Response: Pt is continuing to apply interventions learned in session into daily life situations. Pt is currently on track to meet goals utilizing interventions mentioned above. Personal growth and progress noted. Treatment to continue as indicated.   Plan: Return again in 4 weeks.  Diagnosis: MDD (major depressive disorder), recurrent, in full remission  ([place])  Collaboration of Care: Other Pt encouraged to continue psychiatric care with Dr. Modesta Messing  Patient/Guardian was advised Release of Information must be obtained prior to any record release in order to collaborate their care with an outside provider. Patient/Guardian was advised if they have not already done so to contact the registration department to sign all necessary forms in order for [place] to release information regarding their care.   Consent: Patient/Guardian gives verbal consent for treatment and assignment of benefits for services provided during this visit. Patient/Guardian expressed understanding and agreed to proceed.   [place], LCSW 07/13/2021

## 2021-07-13 NOTE — Telephone Encounter (Signed)
Called PP. Denies any needs at this time. 

## 2021-07-13 NOTE — Telephone Encounter (Signed)
At time we placed the re-enrollment order, patient was on 0.5 mg weekly, so that dose was shipped. Patient is currently injecting ~ 0.75 mg weekly (halfway between 0.5 and 1 on 1 mg pen) and tolerating.   Advised patient to use currently supply by injecting 0.5 mg + 0.25 mg weekly. Will submit order for 1 mg pen strength to [facility]

## 2021-07-13 NOTE — Plan of Care (Signed)
°  Problem: Decrease depressive symptoms and improve levels of effective functioning Goal: LTG: Reduce frequency, intensity, and duration of depression symptoms as evidenced by: pt self report Outcome: Progressing Goal: STG: @PREFFIRSTNAME @ will participate in at least 80% of scheduled individual psychotherapy sessions Outcome: Progressing

## 2021-07-13 NOTE — Telephone Encounter (Signed)
Pt picked ozempic

## 2021-07-13 NOTE — Addendum Note (Signed)
Addended by: De Hollingshead on: 07/13/2021 04:55 PM   Modules accepted: Orders

## 2021-07-13 NOTE — Telephone Encounter (Signed)
Pt called in requesting to speak with you about medication (Semaglutide,0.25 or 0.5MG /DOS, (OZEMPIC, 0.25 OR 0.5 MG/DOSE,) 2 MG/1.5ML SOPN). Pt requesting callback

## 2021-07-18 DIAGNOSIS — G8929 Other chronic pain: Secondary | ICD-10-CM | POA: Diagnosis not present

## 2021-07-18 DIAGNOSIS — Z7984 Long term (current) use of oral hypoglycemic drugs: Secondary | ICD-10-CM

## 2021-07-18 DIAGNOSIS — N1831 Chronic kidney disease, stage 3a: Secondary | ICD-10-CM | POA: Diagnosis not present

## 2021-07-18 DIAGNOSIS — F17210 Nicotine dependence, cigarettes, uncomplicated: Secondary | ICD-10-CM

## 2021-07-18 DIAGNOSIS — Z7985 Long-term (current) use of injectable non-insulin antidiabetic drugs: Secondary | ICD-10-CM | POA: Diagnosis not present

## 2021-07-18 DIAGNOSIS — E1122 Type 2 diabetes mellitus with diabetic chronic kidney disease: Secondary | ICD-10-CM

## 2021-07-18 DIAGNOSIS — E785 Hyperlipidemia, unspecified: Secondary | ICD-10-CM

## 2021-07-18 DIAGNOSIS — F32A Depression, unspecified: Secondary | ICD-10-CM

## 2021-07-18 DIAGNOSIS — I129 Hypertensive chronic kidney disease with stage 1 through stage 4 chronic kidney disease, or unspecified chronic kidney disease: Secondary | ICD-10-CM

## 2021-07-18 DIAGNOSIS — E1142 Type 2 diabetes mellitus with diabetic polyneuropathy: Secondary | ICD-10-CM | POA: Diagnosis not present

## 2021-07-26 NOTE — Progress Notes (Unsigned)
[place] MD/PA/NP OP Progress Note  07/26/2021 11:51 AM James Hood  MRN:  [id number]  Chief Complaint: No chief complaint on file.  HPI: *** - restless leg syndrome. Ferritin wnl Visit Diagnosis: No diagnosis found.  Past Psychiatric History: Please see initial evaluation for full details. I have reviewed the history. No updates at this time.     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   Colon polyps    Dementia ([place])    Depression    Diabetes mellitus type 2 in nonobese (HCC)    GERD (gastroesophageal reflux disease)    BARRETTS ESOPHAGUS RESOLVED PER PATIENT   Headache    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[address])    History of hiatal hernia    Hypercholesteremia    Hypertension    Hyperthyroidism    Neuropathic pain    Primary localized osteoarthritis of right knee    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   Stomach ulcer    Tremor, essential    Wound infection complicating hardware ([place]) 04/02/2018    Past Surgical History:  Procedure Laterality Date   ANKLE ARTHROSCOPY Left 09/29/2019   Procedure: LEFT ANKLE ARTHROSCOPY, DEBRIDEMENT;  Surgeon: Newt Minion, MD;  Location: [place];  Service: Orthopedics;  Laterality: Left;   BACK SURGERY     COLONOSCOPY WITH PROPOFOL N/A 09/14/2016   Procedure: COLONOSCOPY WITH PROPOFOL;  Surgeon: Manya Silvas, MD;  [facility] ENDOSCOPY;  Service: Endoscopy;  Laterality: N/A;   esophageal stretch     ESOPHAGOGASTRODUODENOSCOPY (EGD) WITH PROPOFOL N/A 12/13/2020   Procedure: ESOPHAGOGASTRODUODENOSCOPY (EGD) WITH PROPOFOL;  Surgeon: Lesly Rubenstein, MD;  Location: [facility] ENDOSCOPY;  Service: Endoscopy;  Laterality: N/A;  IDDM   JOINT REPLACEMENT Right 2016   knee   LUMBAR LAMINECTOMY/DECOMPRESSION MICRODISCECTOMY Left 02/03/2016    Procedure: LEFT L5-S1 DISKECTOMY;  Surgeon: Leeroy Cha, MD;  Location: [facility]URO ORS;  Service: Neurosurgery;  Laterality: Left;  LEFT L5-S1 DISKECTOMY   PULSE GENERATOR IMPLANT N/A 08/21/2017   Procedure: UNILATERAL PULSE GENERATOR IMPLANT;  Surgeon: Meade Maw, MD;  Location: [facility] ORS;  Service: Neurosurgery;  Laterality: N/A;   PULSE GENERATOR IMPLANT Right 04/02/2018   Procedure: REMOVAL OF PULSE GENERATOR IMPLANT AND LEADS;  Surgeon: Meade Maw, MD;  Location: [facility] ORS;  Service: Neurosurgery;  Laterality: Right;   TONSILLECTOMY     TOTAL KNEE ARTHROPLASTY Right 11/15/2014   Procedure: TOTAL KNEE ARTHROPLASTY;  Surgeon: Elsie Saas, MD;  Location: [place];  Service: Orthopedics;  Laterality: Right;    Family Psychiatric History: Please see initial evaluation for full details. I have reviewed the history. No updates at this time.     Family History:  Family History  Problem Relation Age of Onset   Heart attack Mother    Heart disease Mother    Hypertension Father    Depression Father    Kidney cancer Neg Hx    Prostate cancer Neg Hx     Social History:  Social History   Socioeconomic History   Marital status: Married    Spouse name: Not on file   Number of children: Not on file   Years of education: Not on file   Highest education level: Not on file  Occupational History   Not on file  Tobacco Use  Smoking status: Every Day    Packs/day: 0.50    Years: 60.00    Pack years: 30.00    Types: Cigarettes   Smokeless tobacco: Never  Vaping Use   Vaping Use: Former  Substance and Sexual Activity   Alcohol use: Not Currently    Comment: recovering alcoholic 1 yrs sober   Drug use: Never   Sexual activity: Not Currently  Other Topics Concern   Not on file  Social History Narrative   Not on file   Social Determinants of Health   Financial Resource Strain: Medium Risk   Difficulty of Paying Living Expenses: Somewhat hard  Food Insecurity: Not on  file  Transportation Needs: Not on file  Physical Activity: Not on file  Stress: Not on file  Social Connections: Not on file    Allergies: No Known Allergies  Metabolic Disorder Labs: Lab Results  Component Value Date   HGBA1C 7.5 (H) 05/10/2021   MPG 131 01/25/2016   MPG 166 11/05/2014   No results found for: PROLACTIN Lab Results  Component Value Date   CHOL 99 07/05/2020   TRIG 226.0 (H) 07/05/2020   HDL 43.20 07/05/2020   CHOLHDL 2 07/05/2020   VLDL 45.2 (H) 07/05/2020   LDLCALC 57 09/25/2017   Lab Results  Component Value Date   TSH 0.97 10/11/2020   TSH 0.346 (L) 05/31/2020    Therapeutic Level Labs: No results found for: LITHIUM No results found for: VALPROATE No components found for:  CBMZ  Current Medications: Current Outpatient Medications  Medication Sig Dispense Refill   aspirin EC 81 MG tablet Take 81 mg by mouth daily.     budesonide (ENTOCORT EC) 3 MG 24 hr capsule Take 6 mg by mouth daily.     buPROPion (WELLBUTRIN XL) 300 MG 24 hr tablet Take 1 tablet (300 mg total) by mouth daily. 90 tablet 0   Continuous Blood Gluc Sensor (FREESTYLE LIBRE 14 DAY SENSOR) MISC APPLY ONE SENSOR EVERY 14 DAYS TO MONITOR BLOOD GLUCOSE LEVELS. REMOVE OLD SENSOR NEFORE APPLYING NEW SENSOR 2 each 5   dapagliflozin propanediol (FARXIGA) 10 MG TABS tablet Take 1 tablet (10 mg total) by mouth daily. 90 tablet 3   divalproex (DEPAKOTE ER) 500 MG 24 hr tablet Take 1 tablet by mouth 2 (two) times daily.     donepezil (ARICEPT) 10 MG tablet Take 10 mg by mouth at bedtime.     doxycycline (VIBRA-TABS) 100 MG tablet Take 1 tablet (100 mg total) by mouth 2 (two) times daily. 20 tablet 0   fenofibrate (TRICOR) 145 MG tablet TAKE ONE TABLET BY MOUTH DAILY 90 tablet 1   ferrous sulfate 325 (65 FE) MG EC tablet Take 325 mg by mouth daily.     gabapentin (NEURONTIN) 600 MG tablet TAKE TWO TABLETS BY MOUTH THREE TIMES A DAY 180 tablet 1   HYDROcodone-acetaminophen (NORCO) 10-325 MG  tablet Take 1 tablet by mouth every 6 (six) hours as needed for severe pain. Must last 30 days. 120 tablet 0   [START ON 08/04/2021] HYDROcodone-acetaminophen (NORCO) 10-325 MG tablet Take 1 tablet by mouth every 4 (four) hours as needed for severe pain. Must last 30 days. 120 tablet 0   [START ON 09/03/2021] HYDROcodone-acetaminophen (NORCO) 10-325 MG tablet Take 1 tablet by mouth every 6 (six) hours as needed for severe pain. Must last 30 days. 120 tablet 0   Hyoscyamine Sulfate SL 0.125 MG SUBL Place 1 tablet under the tongue daily as needed.  ipratropium (ATROVENT) 0.03 % nasal spray Place 2 sprays into both nostrils every 12 (twelve) hours. 30 mL 12   lisinopril-hydrochlorothiazide (ZESTORETIC) 20-25 MG tablet Take 1 tablet by mouth daily. 90 tablet 1   memantine (NAMENDA) 10 MG tablet Take 1 tablet by mouth daily.     metFORMIN (GLUCOPHAGE-XR) 500 MG 24 hr tablet Take 1 tablet (500 mg total) by mouth 2 (two) times daily. 180 tablet 1   metoprolol succinate (TOPROL-XL) 25 MG 24 hr tablet Take 25 mg by mouth daily.     Multiple Vitamins-Minerals (CENTRUM SILVER PO) Take 1 tablet by mouth daily.     omega-3 acid ethyl esters (LOVAZA) 1 g capsule Take 2 capsules (2 g total) by mouth 2 (two) times daily. 360 capsule 3   omeprazole (PRILOSEC) 20 MG capsule Take 20 mg by mouth daily.     primidone (MYSOLINE) 50 MG tablet Take 150-250 mg by mouth 2 (two) times daily. Take 250 mg by mouth in the morning & take 150 mg at night.     promethazine-dextromethorphan (PROMETHAZINE-DM) 6.25-15 MG/5ML syrup Take 5 mLs by mouth 4 (four) times daily as needed for cough. 118 mL 0   QUEtiapine (SEROQUEL) 100 MG tablet Take 150 mg by mouth at bedtime.     Semaglutide, 1 MG/DOSE, (OZEMPIC, 1 MG/DOSE,) 4 MG/3ML SOPN Inject 1 mg into the skin once a week. 3 mL 1   simvastatin (ZOCOR) 40 MG tablet Take 40 mg by mouth daily.     tamsulosin (FLOMAX) 0.4 MG CAPS capsule TAKE ONE CAPSULE BY MOUTH DAILY AFTER DINNER 90  capsule 1   traZODone (DESYREL) 100 MG tablet Take 2 tablets (200 mg total) by mouth at bedtime as needed for sleep. 180 tablet 0   varenicline (CHANTIX) 1 MG tablet Take 1/2 tablet by mouth daily for 3 days, then 1/2 tablet by mouth twice daily for 4 days, then 1 tablet twice daily thereafter. Take with food. 60 tablet 5   vitamin C (ASCORBIC ACID) 500 MG tablet Take 500 mg by mouth daily.     No current facility-administered medications for this visit.     Musculoskeletal: Strength & Muscle Tone: within normal limits Gait & Station: normal Patient leans: N/A  Psychiatric Specialty Exam: Review of Systems  There were no vitals taken for this visit.There is no height or weight on file to calculate BMI.  General Appearance: {Appearance:22683}  Eye Contact:  {BHH EYE CONTACT:22684}  Speech:  Clear and Coherent  Volume:  Normal  Mood:  {BHH MOOD:22306}  Affect:  {Affect (PAA):22687}  Thought Process:  Coherent  Orientation:  Full (Time, Place, and Person)  Thought Content: Logical   Suicidal Thoughts:  {ST/HT (PAA):22692}  Homicidal Thoughts:  {ST/HT (PAA):22692}  Memory:  Immediate;   Good  Judgement:  {Judgement (PAA):22694}  Insight:  {Insight (PAA):22695}  Psychomotor Activity:  Normal  Concentration:  Concentration: Good and Attention Span: Good  Recall:  Good  Fund of Knowledge: Good  Language: Good  Akathisia:  No  Handed:  Right  AIMS (if indicated): not done  Assets:  Communication Skills Desire for Improvement  ADL's:  Intact  Cognition: WNL  Sleep:  {BHH GOOD/FAIR/POOR:22877}   Screenings: GAD-7    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 in [place] from 12/07/2020 in [place] Counselor from 11/02/2020 in [place] from 09/07/2020 in [place] Office Visit from 09/09/2015 in [facility]  Total GAD-7  Score 0 '3 6 5 6      '$ Mini-Mental    Flowsheet Row Clinical Support from 11/15/2017 in [place] from 11/14/2016 in [facility]  Total Score (max 30 points ) 30 30      PHQ2-9    [place] Visit from 06/29/2021 in [place] Office Visit from 06/26/2021 in [facility] Office Visit from 05/01/2021 in [place] Visit from 04/11/2021 in [facility] Office Visit from 03/29/2021 in [place]  PHQ-2 Total Score 0 0 0 0 0  PHQ-9 Total Score -- -- -- 0 --      [place] Office Visit from 05/01/2021 in [place] Office Visit from 01/30/2021 in [place] Counselor from 01/18/2021 in [place] No Risk No Risk No Risk        Assessment and Plan:  James Hood is a 81 y.o. year old male with a history of pseudo dementia, sleep disorder, tremor, history of QTc prolongation,, who presents for follow up appointment for below.    1. MDD (major depressive disorder), recurrent, in full remission ([place]) He denies significant mood symptoms since the last visit.  Psychosocial stressors includes current politics, conflict with his stepson, his daughter, and demoralization due to aging/pain.  He continues to be actively involved in [place], and stays connected with his family including his wife.  Will continue current dose of bupropion as maintenance treatment for depression.  Noted that he has been on quetiapine, prescribed by his neurologist, and he prefers to stay on the current dose.    2. Insomnia, unspecified type Significant benefit from higher dose of trazodone without drowsiness.  Will continue current dose to target insomnia.    # History of  cognitive impairment Follow-up by neurologist, and has been on memantine and donepezil as below.    This clinician has discussed the side effect associated with medication prescribed during this encounter. Please refer to notes in the previous encounters for more details.      Plan Continue bupropion XR 300 mg daily   Continue Trazodone 200 mg at night as needed for insomnia Next appointment: 3/13 at 9 Am for 30 mins, in person - on quetiapine 125 mg at night - qtc 425 msec with iRBBB 06/08/2020 (550 msec on 1/10) - on Donepezil 10 mg qhs, memantine 10 mg 10 mg twice a day - on Gabapentin 600 mg TID , primidone for tremor - on Depakote 500 mg BID for reportedly sleep disorder   The patient demonstrates the following risk factors for suicide: Chronic risk factors for suicide include: psychiatric disorder of depression. Acute risk factors for suicide include: N/A. Protective factors for this patient include: positive social support, responsibility to others (children, family), coping skills and hope for the future. Considering these factors, the overall suicide risk at this point appears to be low. Patient is appropriate for outpatient follow up.     Collaboration of Care: Collaboration of Care: {BH OP Collaboration of Care:21014065}  Patient/Guardian was advised Release of Information must be obtained prior to any record release in order to collaborate their care with an outside provider. Patient/Guardian was advised if they have not already done so to contact the registration department to sign all necessary forms in order for [place] to release information regarding their care.  Consent: Patient/Guardian gives verbal consent for treatment and assignment of benefits for services provided during this visit. Patient/Guardian expressed understanding and agreed to proceed.    Norman Clay, MD 07/26/2021, 11:51 AM

## 2021-07-27 ENCOUNTER — Ambulatory Visit: Payer: Self-pay | Admitting: Pharmacist

## 2021-07-27 NOTE — Patient Instructions (Signed)
Hi Bob,  ? ?I am being asked to quickly transition into another role within the health system, so unfortunately I am unable to keep our next appointment. Please continue to follow up with your primary care provider as scheduled.  ? ?As a reminder -  ? ?[facility] [employer] Patient Assistance Program has an auto-refill program where you do not need to call and request a refill each time. You should receive your medication shipment before you run out of your medication. However, if you have any issues or need assistance, you can call them at [phone]. They are available Monday though Friday 8:00 AM - 8:00 PM.  ? ? ?It has been a pleasure working with you! ? ?Catie Darnelle Maffucci, PharmD ? ?

## 2021-07-27 NOTE — Chronic Care Management (AMB) (Signed)
?  Chronic Care Management  ? ?Note ? ?07/27/2021 ?Name: HALLIS MEDITZ MRN: [id number] DOB: 1940/07/18 ? ? ? ?Closing pharmacy CCM case at this time.  Patient has clinic contact information for future questions or concerns.  ? ?Catie Darnelle Maffucci, PharmD, [place], CPP ?Clinical Pharmacist ?Therapist, music at [employer] ?[phone] ? ?

## 2021-07-31 ENCOUNTER — Ambulatory Visit: Payer: PPO | Admitting: Psychiatry

## 2021-07-31 ENCOUNTER — Ambulatory Visit (INDEPENDENT_AMBULATORY_CARE_PROVIDER_SITE_OTHER): Payer: PPO | Admitting: Psychiatry

## 2021-07-31 ENCOUNTER — Encounter: Payer: Self-pay | Admitting: Psychiatry

## 2021-07-31 ENCOUNTER — Other Ambulatory Visit: Payer: Self-pay

## 2021-07-31 VITALS — BP 114/72 | HR 91 | Temp 98.7°F | Wt 172.2 lb

## 2021-07-31 DIAGNOSIS — F3342 Major depressive disorder, recurrent, in full remission: Secondary | ICD-10-CM | POA: Diagnosis not present

## 2021-07-31 DIAGNOSIS — G4700 Insomnia, unspecified: Secondary | ICD-10-CM | POA: Diagnosis not present

## 2021-07-31 MED ORDER — BUPROPION HCL ER (XL) 300 MG PO TB24: 300.0000 mg | ORAL_TABLET | Freq: Every day | ORAL | 0 refills | Status: DC

## 2021-07-31 MED ORDER — TRAZODONE HCL 100 MG PO TABS: 200.0000 mg | ORAL_TABLET | Freq: Every evening | ORAL | 0 refills | Status: DC | PRN

## 2021-07-31 NOTE — Patient Instructions (Signed)
Continue bupropion XR 300 mg daily  ?Continue Trazodone 200 mg at night as needed for insomnia  ?Decrease quetiapine by 25 mg every day  ?Next appointment: 5/8 at 2 PM ?

## 2021-07-31 NOTE — Progress Notes (Signed)
[facility] MD/PA/NP OP Progress Note  07/31/2021 1:59 PM James Hood  MRN:  [id number]  Chief Complaint:  Chief Complaint  Patient presents with   Follow-up   HPI:  This is a follow-up appointment for depression.  He states that he has been doing very well.  He attends AA meetings a few times per week.  He is a sponsor of six people.  He enjoys his role as a sponsor.  He also attends [employer] every week.Marland Kitchen  He reports good relationship with his wife.  He was notified by his insurance company that quetiapine will not be covered anymore.  He is hoping to discontinue this medication, although he has not reach out to his neurologist.  He agrees to taper down this medication rather than abruptly discontinue this medication.  Although he will initially politely requests to uptitrate the dose of trazodone, he agrees to stay at the current dose at this time.  He denies feeling depressed anhedonia.  He sleeps well.  He denies change in appetite.  He denies SI.  He denies anxiety.   Daily routine: wakes up at 7 am, watch TV, goes out to [place] meeting in person (82 years of membership), sleeps 8-9 pm,  Exercise: (stays in the house) Employment: retired at 81 yo. He used to work as a Manufacturing systems engineer Household: wife Marital status: 82 years with his second wife, divorced before Number of children: 1 child, 2 grandchildren, 3 step children Education: MS in Education officer, environmental Readings from Last 3 Encounters:  07/31/21 172 lb 3.2 oz (78.1 kg)  07/12/21 175 lb (79.4 kg)  06/29/21 175 lb (79.4 kg)     Visit Diagnosis:    ICD-10-CM   1. MDD (major depressive disorder), recurrent, in full remission ([place])  F33.42     2. Insomnia, unspecified type  G47.00       Past Psychiatric History: Please see initial evaluation for full details. I have reviewed the history. No updates at this time.     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/2017   Colon polyps    Dementia ([place])    Depression    Diabetes mellitus type 2 in nonobese (HCC)    GERD (gastroesophageal reflux disease)    BARRETTS ESOPHAGUS RESOLVED PER PATIENT   Headache    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[place])    History of hiatal hernia    Hypercholesteremia    Hypertension    Hyperthyroidism    Neuropathic pain    Primary localized osteoarthritis of right knee    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   Stomach ulcer    Tremor, essential    Wound infection complicating hardware ([place]) 04/02/2018    Past Surgical History:  Procedure Laterality Date   ANKLE ARTHROSCOPY Left 09/29/2019   Procedure: LEFT ANKLE ARTHROSCOPY, DEBRIDEMENT;  Surgeon: Newt Minion, MD;  Location: [facility];  Service: Orthopedics;  Laterality: Left;   BACK SURGERY     COLONOSCOPY WITH PROPOFOL N/A 09/14/2016   Procedure: COLONOSCOPY WITH PROPOFOL;  Surgeon: Manya Silvas, MD;  Location: [facility] ENDOSCOPY;  Service: Endoscopy;  Laterality: N/A;   esophageal stretch     ESOPHAGOGASTRODUODENOSCOPY (EGD) WITH PROPOFOL N/A 12/13/2020   Procedure: ESOPHAGOGASTRODUODENOSCOPY (EGD) WITH PROPOFOL;  Surgeon: Lesly Rubenstein, MD;  Location: [facility] ENDOSCOPY;  Service: Endoscopy;  Laterality: N/A;  IDDM   JOINT REPLACEMENT Right 2016   knee   LUMBAR LAMINECTOMY/DECOMPRESSION MICRODISCECTOMY Left 02/03/2016   Procedure: LEFT L5-S1 DISKECTOMY;  Surgeon: Leeroy Cha, MD;  Location: [facility] NEURO ORS;  Service: Neurosurgery;  Laterality: Left;  LEFT L5-S1 DISKECTOMY   PULSE GENERATOR IMPLANT N/A 08/21/2017   Procedure: UNILATERAL PULSE GENERATOR IMPLANT;  Surgeon: Meade Maw, MD;  Location: [facility] ORS;  Service: Neurosurgery;  Laterality: N/A;   PULSE GENERATOR IMPLANT Right 04/02/2018   Procedure: REMOVAL OF PULSE GENERATOR IMPLANT AND  LEADS;  Surgeon: Meade Maw, MD;  Location: [facility] ORS;  Service: Neurosurgery;  Laterality: Right;   TONSILLECTOMY     TOTAL KNEE ARTHROPLASTY Right 11/15/2014   Procedure: TOTAL KNEE ARTHROPLASTY;  Surgeon: Elsie Saas, MD;  Location: [facility];  Service: Orthopedics;  Laterality: Right;    Family Psychiatric History: Please see initial evaluation for full details. I have reviewed the history. No updates at this time.     Family History:  Family History  Problem Relation Age of Onset   Heart attack Mother    Heart disease Mother    Hypertension Father    Depression Father    Kidney cancer Neg Hx    Prostate cancer Neg Hx     Social History:  Social History   Socioeconomic History   Marital status: Married    Spouse name: Not on file   Number of children: Not on file   Years of education: Not on file   Highest education level: Not on file  Occupational History   Not on file  Tobacco Use   Smoking status: Every Day    Packs/day: 0.50    Years: 60.00    Pack years: 30.00    Types: Cigarettes   Smokeless tobacco: Never  Vaping Use   Vaping Use: Former  Substance and Sexual Activity   Alcohol use: Not Currently    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   Drug use: Never   Sexual activity: Not Currently  Other Topics Concern   Not on file  Social History Narrative   Not on file   Social Determinants of Health   Financial Resource Strain: Medium Risk   Difficulty of Paying Living Expenses: Somewhat hard  Food Insecurity: Not on file  Transportation Needs: Not on file  Physical Activity: Not on file  Stress: Not on file  Social Connections: Not on file    Allergies: No Known Allergies  Metabolic Disorder Labs: Lab Results  Component Value Date   HGBA1C 7.5 (H) 05/10/2021   MPG 131 01/25/2016   MPG 166 11/05/2014   No results found for: PROLACTIN Lab Results  Component Value Date   CHOL 99 07/05/2020   TRIG 226.0 (H) 07/05/2020   HDL 43.20  07/05/2020   CHOLHDL 2 07/05/2020   VLDL 45.2 (H) 07/05/2020   LDLCALC 57 09/25/2017   Lab Results  Component Value Date   TSH 0.97 10/11/2020   TSH 0.346 (L) 05/31/2020    Therapeutic Level Labs: No results found for: LITHIUM No results found for: VALPROATE No components found for:  CBMZ  Current Medications: Current Outpatient Medications  Medication Sig Dispense Refill   aspirin EC 81 MG tablet Take 81 mg by mouth daily.     budesonide (ENTOCORT EC) 3 MG 24 hr capsule Take 6 mg by mouth daily.     Continuous Blood Gluc Sensor (FREESTYLE LIBRE  14 DAY SENSOR) MISC APPLY ONE SENSOR EVERY 14 DAYS TO MONITOR BLOOD GLUCOSE LEVELS. REMOVE OLD SENSOR NEFORE APPLYING NEW SENSOR 2 each 5   dapagliflozin propanediol (FARXIGA) 10 MG TABS tablet Take 1 tablet (10 mg total) by mouth daily. 90 tablet 3   divalproex (DEPAKOTE ER) 500 MG 24 hr tablet Take 1 tablet by mouth 2 (two) times daily.     donepezil (ARICEPT) 10 MG tablet Take 10 mg by mouth at bedtime.     doxycycline (VIBRA-TABS) 100 MG tablet Take 1 tablet (100 mg total) by mouth 2 (two) times daily. 20 tablet 0   fenofibrate (TRICOR) 145 MG tablet TAKE ONE TABLET BY MOUTH DAILY 90 tablet 1   ferrous sulfate 325 (65 FE) MG EC tablet Take 325 mg by mouth daily.     gabapentin (NEURONTIN) 600 MG tablet TAKE TWO TABLETS BY MOUTH THREE TIMES A DAY 180 tablet 1   HYDROcodone-acetaminophen (NORCO) 10-325 MG tablet Take 1 tablet by mouth every 6 (six) hours as needed for severe pain. Must last 30 days. 120 tablet 0   [START ON 08/04/2021] HYDROcodone-acetaminophen (NORCO) 10-325 MG tablet Take 1 tablet by mouth every 4 (four) hours as needed for severe pain. Must last 30 days. 120 tablet 0   [START ON 09/03/2021] HYDROcodone-acetaminophen (NORCO) 10-325 MG tablet Take 1 tablet by mouth every 6 (six) hours as needed for severe pain. Must last 30 days. 120 tablet 0   Hyoscyamine Sulfate SL 0.125 MG SUBL Place 1 tablet under the tongue daily as  needed.     ipratropium (ATROVENT) 0.03 % nasal spray Place 2 sprays into both nostrils every 12 (twelve) hours. 30 mL 12   lisinopril-hydrochlorothiazide (ZESTORETIC) 20-25 MG tablet Take 1 tablet by mouth daily. 90 tablet 1   memantine (NAMENDA) 10 MG tablet Take 1 tablet by mouth daily.     metFORMIN (GLUCOPHAGE-XR) 500 MG 24 hr tablet Take 1 tablet (500 mg total) by mouth 2 (two) times daily. 180 tablet 1   metoprolol succinate (TOPROL-XL) 25 MG 24 hr tablet Take 25 mg by mouth daily.     Multiple Vitamins-Minerals (CENTRUM SILVER PO) Take 1 tablet by mouth daily.     omega-3 acid ethyl esters (LOVAZA) 1 g capsule Take 2 capsules (2 g total) by mouth 2 (two) times daily. 360 capsule 3   omeprazole (PRILOSEC) 20 MG capsule Take 20 mg by mouth daily.     primidone (MYSOLINE) 50 MG tablet Take 150-250 mg by mouth 2 (two) times daily. Take 250 mg by mouth in the morning & take 150 mg at night.     promethazine-dextromethorphan (PROMETHAZINE-DM) 6.25-15 MG/5ML syrup Take 5 mLs by mouth 4 (four) times daily as needed for cough. 118 mL 0   QUEtiapine (SEROQUEL) 100 MG tablet Take 150 mg by mouth at bedtime.     Semaglutide, 1 MG/DOSE, (OZEMPIC, 1 MG/DOSE,) 4 MG/3ML SOPN Inject 1 mg into the skin once a week. 3 mL 1   simvastatin (ZOCOR) 40 MG tablet Take 40 mg by mouth daily.     tamsulosin (FLOMAX) 0.4 MG CAPS capsule TAKE ONE CAPSULE BY MOUTH DAILY AFTER DINNER 90 capsule 1   varenicline (CHANTIX) 1 MG tablet Take 1/2 tablet by mouth daily for 3 days, then 1/2 tablet by mouth twice daily for 4 days, then 1 tablet twice daily thereafter. Take with food. 60 tablet 5   vitamin C (ASCORBIC ACID) 500 MG tablet Take 500 mg by mouth daily.     [  START ON 08/23/2021] buPROPion (WELLBUTRIN XL) 300 MG 24 hr tablet Take 1 tablet (300 mg total) by mouth daily. 90 tablet 0   [START ON 08/07/2021] traZODone (DESYREL) 100 MG tablet Take 2 tablets (200 mg total) by mouth at bedtime as needed for sleep. 180 tablet 0    No current facility-administered medications for this visit.     Musculoskeletal: Strength & Muscle Tone:  normal Gait & Station:  normal Patient leans: N/A  Psychiatric Specialty Exam: Review of Systems  Psychiatric/Behavioral:  Negative for agitation, behavioral problems, confusion, decreased concentration, dysphoric mood, hallucinations, self-injury, sleep disturbance and suicidal ideas. The patient is not nervous/anxious and is not hyperactive.   All other systems reviewed and are negative.  Blood pressure 114/72, pulse 91, temperature 98.7 F (37.1 C), temperature source Temporal, weight 172 lb 3.2 oz (78.1 kg).Body mass index is 24.71 kg/m.  General Appearance: Fairly Groomed  Eye Contact:  Good  Speech:  Clear and Coherent  Volume:  Normal  Mood:   good  Affect:  Appropriate, Congruent, and Full Range  Thought Process:  Coherent  Orientation:  Full (Time, Place, and Person)  Thought Content: Logical   Suicidal Thoughts:  No  Homicidal Thoughts:  No  Memory:  Immediate;   Good  Judgement:  Good  Insight:  Good  Psychomotor Activity:  Normal  Concentration:  Concentration: Good and Attention Span: Good  Recall:  Good  Fund of Knowledge: Good  Language: Good  Akathisia:  No  Handed:  Right  AIMS (if indicated): not done  Assets:  Communication Skills Desire for Improvement  ADL's:  Intact  Cognition: WNL  Sleep:  Good   Screenings: GAD-7    Flowsheet Row Counselor from 03/22/2021 in [facility] from 12/07/2020 in [place] from 11/02/2020 in [place] from 09/07/2020 in [facility] from 09/09/2015 in [facility] [place]  Total GAD-7 Score 0 '3 6 5 6      '$ [place] from 11/15/2017 in [facility] from 11/14/2016 in  [facility]  Total Score (max 30 points ) 30 30      PHQ2-9    [facility] Visit from 07/31/2021 in [facility] Office Visit from 06/29/2021 in [facility] Office Visit from 06/26/2021 in [facility] Office Visit from 05/01/2021 in [place] Video Visit from 04/11/2021 in [facility]  PHQ-2 Total Score 0 0 0 0 0  PHQ-9 Total Score -- -- -- -- 0      Flowsheet Row Office Visit from 05/01/2021 in [facility] Office Visit from 01/30/2021 in [facility] Counselor from 01/18/2021 in [place] No Risk No Risk No Risk        Assessment and Plan:  TADEUSZ STAHL is a 81 y.o. year old male with a history of  pseudo dementia, sleep disorder, tremor, history of QTc prolongation,, who presents for follow up appointment for below.   1. MDD (major depressive disorder), recurrent, in full remission ([place]) He denies significant mood symptoms since last visit. Psychosocial stressors includes current politics, conflict with his stepson, his daughter, and demoralization due to aging/pain.  He continues to be actively involved in [place], and stays connected with his family including his  wife.  We will continue current dose of bupropion as maintenance treatment for depression.  Noted that his insurance company declined to cover quetiapine, and he is interested in coming off from this medication.  Advised him to taper off this medication to avoid discontinuation symptoms.  He was also advised to contact the office if any worsening in his mood.   2. Insomnia, unspecified type He has had significant benefit from trazodone without drowsiness.  Will continue current dose to target insomnia.  Noted that he will taper off quetiapine as  described above; he will contact the office if any issues including insomnia afterwards.    # History of cognitive impairment Follow-up by neurologist, and has been on memantine and donepezil as below.    This clinician has discussed the side effect associated with medication prescribed during this encounter. Please refer to notes in the previous encounters for more details.    Plan Continue bupropion XR 300 mg daily  Continue Trazodone 200 mg at night as needed for insomnia  Decrease quetiapine by 25 mg every day (currently takes 125 mg) (qtc 425 msec with iRBBB 06/08/2020 (550 msec on 1/10)) Next appointment: 5/8 at 2 PM for 30 mins, in person - on Donepezil 10 mg qhs, memantine 10 mg 10 mg twice a day - on Gabapentin 600 mg TID , primidone for tremor - on Depakote 500 mg BID for reportedly sleep disorder   The patient demonstrates the following risk factors for suicide: Chronic risk factors for suicide include: psychiatric disorder of depression. Acute risk factors for suicide include: N/A. Protective factors for this patient include: positive social support, responsibility to others (children, family), coping skills and hope for the future. Considering these factors, the overall suicide risk at this point appears to be low. Patient is appropriate for outpatient follow up.    Collaboration of Care: Collaboration of Care: Other reviewed note from his neurologist  Patient/Guardian was advised Release of Information must be obtained prior to any record release in order to collaborate their care with an outside provider. Patient/Guardian was advised if they have not already done so to contact the registration department to sign all necessary forms in order for [place] to release information regarding their care.   Consent: Patient/Guardian gives verbal consent for treatment and assignment of benefits for services provided during this visit. Patient/Guardian expressed understanding and agreed to  proceed.    Norman Clay, MD 07/31/2021, 1:59 PM

## 2021-08-02 ENCOUNTER — Other Ambulatory Visit: Payer: Self-pay

## 2021-08-02 ENCOUNTER — Other Ambulatory Visit (INDEPENDENT_AMBULATORY_CARE_PROVIDER_SITE_OTHER): Payer: PPO

## 2021-08-02 DIAGNOSIS — E538 Deficiency of other specified B group vitamins: Secondary | ICD-10-CM | POA: Diagnosis not present

## 2021-08-02 DIAGNOSIS — Z1329 Encounter for screening for other suspected endocrine disorder: Secondary | ICD-10-CM

## 2021-08-02 DIAGNOSIS — I10 Essential (primary) hypertension: Secondary | ICD-10-CM | POA: Diagnosis not present

## 2021-08-02 DIAGNOSIS — E559 Vitamin D deficiency, unspecified: Secondary | ICD-10-CM

## 2021-08-02 DIAGNOSIS — E785 Hyperlipidemia, unspecified: Secondary | ICD-10-CM

## 2021-08-02 DIAGNOSIS — E1142 Type 2 diabetes mellitus with diabetic polyneuropathy: Secondary | ICD-10-CM | POA: Diagnosis not present

## 2021-08-02 DIAGNOSIS — Z125 Encounter for screening for malignant neoplasm of prostate: Secondary | ICD-10-CM | POA: Diagnosis not present

## 2021-08-03 LAB — COMPREHENSIVE METABOLIC PANEL
ALT: 18 U/L (ref 0–53)
AST: 21 U/L (ref 0–37)
Albumin: 4.4 g/dL (ref 3.5–5.2)
Alkaline Phosphatase: 57 U/L (ref 39–117)
BUN: 28 mg/dL — ABNORMAL HIGH (ref 6–23)
CO2: 30 mEq/L (ref 19–32)
Calcium: 10.1 mg/dL (ref 8.4–10.5)
Chloride: 97 mEq/L (ref 96–112)
Creatinine, Ser: 1.3 mg/dL (ref 0.40–1.50)
GFR: 51.52 mL/min — ABNORMAL LOW (ref >60.00)
Glucose, Bld: 186 mg/dL — ABNORMAL HIGH (ref 70–99)
Potassium: 4.2 mEq/L (ref 3.5–5.1)
Sodium: 135 mEq/L (ref 135–145)
Total Bilirubin: 0.3 mg/dL (ref 0.2–1.2)
Total Protein: 6.6 g/dL (ref 6.0–8.3)

## 2021-08-03 LAB — CBC WITH DIFFERENTIAL/PLATELET
Basophils Absolute: 0.1 10*3/uL (ref 0.0–0.1)
Basophils Relative: 1 % (ref 0.0–3.0)
Eosinophils Absolute: 0.1 10*3/uL (ref 0.0–0.7)
Eosinophils Relative: 2.2 % (ref 0.0–5.0)
HCT: 37.8 % — ABNORMAL LOW (ref 39.0–52.0)
Hemoglobin: 12.7 g/dL — ABNORMAL LOW (ref 13.0–17.0)
Lymphocytes Relative: 37.4 % (ref 12.0–46.0)
Lymphs Abs: 2.3 10*3/uL (ref 0.7–4.0)
MCHC: 33.6 g/dL (ref 30.0–36.0)
MCV: 95.4 fl (ref 78.0–100.0)
Monocytes Absolute: 0.5 10*3/uL (ref 0.1–1.0)
Monocytes Relative: 7.6 % (ref 3.0–12.0)
Neutro Abs: 3.2 10*3/uL (ref 1.4–7.7)
Neutrophils Relative %: 51.8 % (ref 43.0–77.0)
Platelets: 302 10*3/uL (ref 150.0–400.0)
RBC: 3.96 Mil/uL — ABNORMAL LOW (ref 4.22–5.81)
RDW: 13.2 % (ref 11.5–15.5)
WBC: 6.1 10*3/uL (ref 4.0–10.5)

## 2021-08-03 LAB — LIPID PANEL
Cholesterol: 186 mg/dL (ref 0–200)
HDL: 59.1 mg/dL (ref >39.00)
NonHDL: 126.56
Total CHOL/HDL Ratio: 3
Triglycerides: 304 mg/dL — ABNORMAL HIGH (ref 0.0–149.0)
VLDL: 60.8 mg/dL — ABNORMAL HIGH (ref 0.0–40.0)

## 2021-08-03 LAB — TSH: TSH: 1.08 u[IU]/mL (ref 0.35–5.50)

## 2021-08-03 LAB — VITAMIN D 25 HYDROXY (VIT D DEFICIENCY, FRACTURES): VITD: 24.68 ng/mL — ABNORMAL LOW (ref 30.00–100.00)

## 2021-08-03 LAB — PSA, MEDICARE: PSA: 0.02 ng/ml — ABNORMAL LOW (ref 0.10–4.00)

## 2021-08-03 LAB — HEMOGLOBIN A1C: Hgb A1c MFr Bld: 7.4 % — ABNORMAL HIGH (ref 4.6–6.5)

## 2021-08-03 LAB — LDL CHOLESTEROL, DIRECT: Direct LDL: 87 mg/dL

## 2021-08-03 LAB — VITAMIN B12: Vitamin B-12: 368 pg/mL (ref 211–911)

## 2021-08-04 ENCOUNTER — Telehealth: Payer: Self-pay | Admitting: Family Medicine

## 2021-08-08 ENCOUNTER — Encounter: Payer: Self-pay | Admitting: Family Medicine

## 2021-08-08 ENCOUNTER — Ambulatory Visit (INDEPENDENT_AMBULATORY_CARE_PROVIDER_SITE_OTHER): Payer: PPO | Admitting: Family Medicine

## 2021-08-08 ENCOUNTER — Other Ambulatory Visit: Payer: Self-pay

## 2021-08-08 VITALS — BP 110/70 | HR 84 | Temp 97.8°F | Ht 70.0 in | Wt 172.4 lb

## 2021-08-08 DIAGNOSIS — Z125 Encounter for screening for malignant neoplasm of prostate: Secondary | ICD-10-CM | POA: Diagnosis not present

## 2021-08-08 DIAGNOSIS — I10 Essential (primary) hypertension: Secondary | ICD-10-CM

## 2021-08-08 DIAGNOSIS — E559 Vitamin D deficiency, unspecified: Secondary | ICD-10-CM | POA: Diagnosis not present

## 2021-08-08 DIAGNOSIS — K529 Noninfective gastroenteritis and colitis, unspecified: Secondary | ICD-10-CM

## 2021-08-08 DIAGNOSIS — E1142 Type 2 diabetes mellitus with diabetic polyneuropathy: Secondary | ICD-10-CM

## 2021-08-08 DIAGNOSIS — E785 Hyperlipidemia, unspecified: Secondary | ICD-10-CM | POA: Diagnosis not present

## 2021-08-08 DIAGNOSIS — D649 Anemia, unspecified: Secondary | ICD-10-CM

## 2021-08-08 DIAGNOSIS — Z720 Tobacco use: Secondary | ICD-10-CM | POA: Diagnosis not present

## 2021-08-08 DIAGNOSIS — L89311 Pressure ulcer of right buttock, stage 1: Secondary | ICD-10-CM | POA: Diagnosis not present

## 2021-08-08 DIAGNOSIS — Z0001 Encounter for general adult medical examination with abnormal findings: Secondary | ICD-10-CM | POA: Diagnosis not present

## 2021-08-08 DIAGNOSIS — E538 Deficiency of other specified B group vitamins: Secondary | ICD-10-CM | POA: Diagnosis not present

## 2021-08-08 DIAGNOSIS — Z1329 Encounter for screening for other suspected endocrine disorder: Secondary | ICD-10-CM

## 2021-08-08 DIAGNOSIS — L89301 Pressure ulcer of unspecified buttock, stage 1: Secondary | ICD-10-CM | POA: Insufficient documentation

## 2021-08-08 MED ORDER — ROSUVASTATIN CALCIUM 40 MG PO TABS: 40.0000 mg | ORAL_TABLET | Freq: Every day | ORAL | 3 refills | Status: DC

## 2021-08-08 MED ORDER — BUDESONIDE 3 MG PO CPEP: 6.0000 mg | ORAL_CAPSULE | Freq: Every day | ORAL | 0 refills | Status: DC

## 2021-08-08 NOTE — Assessment & Plan Note (Signed)
I discussed that the area appears to be a pressure injury.  Discussed using barrier cream.  Discussed shifting his weight while he is seated and placing a pillow under 1 buttock and switching sides periodically to help reduce the pressure in this area.  Discussed getting up and moving around more frequently.  If its not improving over the next couple of weeks or if it worsens he will let us know. ?

## 2021-08-08 NOTE — Assessment & Plan Note (Signed)
Continues to be an issue though he has been off of Entocort.  I will restart this and he will follow-up with GI.  Discussed future refills of Entocort need to come from GI. ?

## 2021-08-08 NOTE — Assessment & Plan Note (Signed)
The patient's LDL is not at goal.  We will switch him over to Crestor 40 mg once daily.  He will discontinue simvastatin.  He will monitor for myalgias.  He will return in 6 weeks for lab work. ?

## 2021-08-08 NOTE — Assessment & Plan Note (Signed)
Physical exam completed.  I encouraged adding in exercise as he plans.  Discussed adding in more fruits and vegetables to his diet.  Discussed the updated COVID booster.  The patient will determine if he would like to get this.  His other vaccines are up-to-date.  I encouraged smoking cessation though he is not ready.  He does have Chantix on hand to start 1 week prior to a quit date once he picks a quit date.  Lab work was reviewed with the patient. ?

## 2021-08-08 NOTE — Patient Instructions (Signed)
Nice to see you. ?We will switch you to Crestor 40 mg once daily.  You can discontinue the simvastatin.  We will recheck labs in 6 weeks. ?Please start on vitamin D 1000 international units once daily. ?I sent in Entocort for you to restart.  Future refills should come from GI. ?Please add an exercise as you plan to.  Please add an additional vegetables. ?Please use a barrier cream on the sore on your buttocks and shift your weight while you are sitting as we discussed. ?

## 2021-08-08 NOTE — Assessment & Plan Note (Signed)
Adequately controlled for his age.  Discussed a goal of less than 7.5.  He will continue his freestyle Elenor Legato which was in the target range 84% of the time with no lows. ?

## 2021-08-08 NOTE — Assessment & Plan Note (Signed)
I encouraged the patient to start 1000 international units of vitamin D once daily.  We will plan on rechecking his labs in 6 weeks. ?

## 2021-08-08 NOTE — Telephone Encounter (Signed)
I called and LVM for the patient informing him that his Rx for the [place] sensor was sent to the pharmacy on yesterday. Refael Fulop,cma  ?

## 2021-08-08 NOTE — Progress Notes (Addendum)
?James Rumps, MD ?Phone: [phone] ? ?James Hood is a 81 y.o. male who presents today for CPE. ? ?Diet: breakfast at [employer] with eggs, sausage, tomatoes, occasionally grits, lunch is usually leftovers or sandwich.  He has frozen meals for dinner as his wife is doing a diet currently.  He does not eat many vegetables or fruits.  He does have diet soda. ?Exercise: Patient just joined the gym and sees a trainer later this week to get started on exercising. ?Colonoscopy: aged out ?Prostate cancer screening: UTD ?Family history- ? Prostate cancer: no ? Colon cancer: no ?Vaccines-  ? Flu: UTD ? Tetanus: UTD ? Shingles: UTD ? COVID19: x4 ? Pneumonia: UTD ?HIV screening: aged out ?Hep C Screening: aged out ?Tobacco use: 1/2 PPD not ready to quit ?Alcohol use: no ?Illicit Drug use: no ?Dentist: yes ?Ophthalmology: yes ? ?Buttock sore: This is on his right gluteal cleft.  It has been present for 1 month.  He has been using A&D ointment on it with some benefit.  Has not resolved.  It is sore.  There is no drainage. ? 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  He has seen GI for this.  He is on Lomotil twice daily.  He was on budesonide previously though notes it was not refilled.  He notes this was helpful when he was taking it.  He occasionally will have an accident after eating breakfast. ? ?Reports chronic tinnitus, leakage of urine, frequent urination, joint pain, memory problems, and upper extremity muscular weakness.  Patient notes he is going to start exercising to help with the weakness. ? ? ?Active Ambulatory Problems  ?  Diagnosis Date Noted  ? Hypertension   ? Tremor, essential   ? Primary localized osteoarthritis of right knee   ? Anxiety and depression   ? DJD (degenerative joint disease) of knee 11/15/2014  ? Trochanteric bursitis of left hip 09/09/2015  ? Falls 09/16/2015  ? Barrett's esophagus 09/16/2015  ? Chronic lumbar pain 09/16/2015  ? Encounter for general adult medical  examination with abnormal findings 09/16/2015  ? Benign prostatic hyperplasia 09/16/2015  ? Benign neoplasm of colon 08/25/2013  ? Chronic diarrhea 11/12/2013  ? Type 2 diabetes mellitus ([place]) 10/06/2012  ? Difficulty in walking 06/01/2015  ? HLD (hyperlipidemia) 02/23/2015  ? 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 Tobacco abuse 11/18/2013  ? Anemia 12/09/2015  ? Lumbar herniated disc 02/03/2016  ? Right hip pain 08/16/2016  ? Thyroid nodule 02/27/2017  ? Fatty liver 02/27/2017  ? Chronic pain syndrome 08/26/2017  ? 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 Lumbosacral radiculopathy 01/14/2018  ? Primary osteoarthritis of left ankle 01/14/2018  ? Lumbar facet joint syndrome 01/14/2018  ? Neuropathy 09/24/2018  ? Chronic left shoulder pain 02/02/2019  ? Vitamin D deficiency 02/18/2019  ? Chronic pain of left ankle 02/27/2019  ? Impingement of left ankle joint   ? 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 Prolonged Q-T interval on ECG 06/08/2020  ? OAB (overactive bladder) 08/08/2020  ? Fatigue 10/11/2020  ? Chronic, continuous use of opioids 11/15/2020  ? Therapeutic opioid-induced constipation (OIC) 11/15/2020  ? Skin lesions 01/11/2021  ? Primary osteoarthritis of left shoulder 01/25/2021  ? RLS (restless legs syndrome) 06/26/2021  ? Bilateral leg pain 06/26/2021  ? Pressure injury of buttock, stage 1 08/08/2021  ? ?Resolved Ambulatory Problems  ?  Diagnosis Date Noted  ? Diabetes mellitus type 2 in nonobese [facility])   ? Headache 09/09/2015  ? Benign essential tremor  03/10/2015  ? Controlled type 2 diabetes mellitus without complication ([place]) 62/94/7654  ? Clinical depression 01/06/2014  ? Static tremor 10/06/2012  ? Facial droop 05/03/2015  ? Amnesia 06/01/2015  ? Absence of sensation 11/18/2013  ? Episode of syncope 11/23/2014  ? Has a tremor 10/02/2015  ? Hypertriglyceridemia 12/09/2015  ? Sinusitis, acute maxillary 05/30/2016  ? Tobacco abuse counseling 05/30/2016  ? Night sweats 06/19/2016  ? Flu 08/09/2016  ? Hyponatremia  08/16/2016  ? Diarrhea 08/27/2016  ? Purpura ([place]) 01/29/2017  ? 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 Upper respiratory infection 03/19/2018  ? Wound infection complicating hardware ([place]) 04/02/2018  ? Weight loss 07/23/2019  ? Sleeping difficulty 08/24/2019  ? Hiccups 08/24/2019  ? Right leg pain 02/05/2020  ? CAP (community acquired pneumonia) 05/30/2020  ? Severe sepsis ([place]) 06/01/2020  ? Acute metabolic encephalopathy 65/07/5463  ? AKI (acute kidney injury) ([place]) 08/08/2020  ? Leg cramps 08/08/2020  ? Rhinorrhea 01/11/2021  ? ?Past Medical History:  ?Diagnosis Date  ? Anxiety   ? Arthritis   ? BPH (benign prostatic hyperplasia)   ? 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 Colon polyps   ? Dementia ([place])   ? Depression   ? GERD (gastroesophageal reflux disease)   ? 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[place])   ? History of hiatal hernia   ? Hypercholesteremia   ? Hyperthyroidism   ? Neuropathic pain   ? Stomach ulcer   ? ? ?Family History  ?Problem Relation Age of Onset  ? Heart attack Mother   ? Heart disease Mother   ? Hypertension Father   ? Depression Father   ? Kidney cancer Neg Hx   ? Prostate cancer Neg Hx   ? ? ?Social History  ? ?Socioeconomic History  ? Marital status: Married  ?  Spouse name: Not on file  ? Number of children: Not on file  ? Years of education: Not on file  ? Highest education level: Not on file  ?Occupational History  ? Not on file  ?Tobacco Use  ? Smoking status: Every Day  ?  Packs/day: 0.50  ?  Years: 60.00  ?  Pack years: 30.00  ?  Types: Cigarettes  ? Smokeless tobacco: Never  ?Vaping Use  ? Vaping Use: Former  ?Substance and Sexual Activity  ? Alcohol use: Not Currently  ?  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 Drug use: Never  ? Sexual activity: Not Currently  ?Other Topics Concern  ? Not on file  ?Social History Narrative  ? Not on file  ? ?Social Determinants of Health  ? ?Financial Resource Strain: Medium Risk  ? Difficulty of Paying  Living Expenses: Somewhat hard  ?Food Insecurity: Not on file  ?Transportation Needs: Not on file  ?Physical Activity: Not on file  ?Stress: Not on file  ?Social Connections: Not on file  ?Intimate Partner Violence: Not on file  ? ? ?ROS ? ?General:  Negative for nexplained weight loss, fever ?Skin: Positive for sore that will not heal, negative for new or changing mole ?HEENT: Positive for tinnitus, negative for trouble hearing, trouble seeing, mouth sores, hoarseness, change in voice, dysphagia. ?CV:  Negative for chest pain, dyspnea, edema, palpitations ?Resp: Negative for cough, dyspnea, hemoptysis ?GI: Positive for diarrhea, negative for nausea, vomiting, constipation, abdominal pain, melena, hematochezia. ?GU: Positive for urinary leakage and frequent urination, negative for dysuria, urinary hesitance, hematuria, vaginal or penile discharge, sexual difficulty, lumps in  testicle or breasts ?MSK: Positive for joint pain, negative for muscle cramps or aches ?Neuro: Positive for weakness, negative for headaches, numbness, dizziness, passing out/fainting ?Psych: Positive for memory problems, negative for depression, anxiety ? ?Objective ? ?Physical Exam ?Vitals:  ? 08/08/21 0802  ?BP: 110/70  ?Pulse: 84  ?Temp: 97.8 ?F (36.6 ?C)  ?SpO2: 95%  ? ? ?BP Readings from Last 3 Encounters:  ?08/08/21 110/70  ?07/12/21 106/68  ?06/29/21 100/66  ? ?Wt Readings from Last 3 Encounters:  ?08/08/21 172 lb 6.4 oz (78.2 kg)  ?07/12/21 175 lb (79.4 kg)  ?06/29/21 175 lb (79.4 kg)  ? ? ?Physical Exam ?Constitutional:   ?   General: He is not in acute distress. ?   Appearance: He is not diaphoretic.  ?HENT:  ?   Head: Normocephalic and atraumatic.  ?Eyes:  ?   Conjunctiva/sclera: Conjunctivae normal.  ?   Pupils: Pupils are equal, round, and reactive to light.  ?Cardiovascular:  ?   Rate and Rhythm: Normal rate and regular rhythm.  ?   Heart sounds: Normal heart sounds.  ?Pulmonary:  ?   Effort: Pulmonary effort is normal.  ?    Breath sounds: Normal breath sounds.  ?Abdominal:  ?   General: Bowel sounds are normal. There is no distension.  ?   Palpations: Abdomen is soft.  ?   Tenderness: There is no abdominal tenderness.  ?Musculoskeleta

## 2021-08-08 NOTE — Assessment & Plan Note (Signed)
Chronic and stable.  We will continue to monitor. ?

## 2021-08-08 NOTE — Telephone Encounter (Addendum)
Pt need refill on freestyle libre 14 day sensor sent to [facility] ?

## 2021-08-09 ENCOUNTER — Ambulatory Visit
Payer: PPO | Attending: Student in an Organized Health Care Education/Training Program | Admitting: Student in an Organized Health Care Education/Training Program

## 2021-08-09 ENCOUNTER — Encounter: Payer: Self-pay | Admitting: Student in an Organized Health Care Education/Training Program

## 2021-08-09 DIAGNOSIS — M25872 Other specified joint disorders, left ankle and foot: Secondary | ICD-10-CM

## 2021-08-09 DIAGNOSIS — M47816 Spondylosis without myelopathy or radiculopathy, lumbar region: Secondary | ICD-10-CM

## 2021-08-09 DIAGNOSIS — M25572 Pain in left ankle and joints of left foot: Secondary | ICD-10-CM

## 2021-08-09 DIAGNOSIS — G894 Chronic pain syndrome: Secondary | ICD-10-CM | POA: Diagnosis not present

## 2021-08-09 DIAGNOSIS — M19072 Primary osteoarthritis, left ankle and foot: Secondary | ICD-10-CM | POA: Diagnosis not present

## 2021-08-09 DIAGNOSIS — M19012 Primary osteoarthritis, left shoulder: Secondary | ICD-10-CM

## 2021-08-09 DIAGNOSIS — G8929 Other chronic pain: Secondary | ICD-10-CM

## 2021-08-09 NOTE — Assessment & Plan Note (Signed)
Unfortunately, no benefit with diagnostic lumbar medial branch nerve blocks.  Do not recommend repeating or proceeding with lumbar RFA. ?

## 2021-08-09 NOTE — Progress Notes (Signed)
Patient: James Hood  Service Category: E/M  Provider: [name], MD  ?DOB: Sep 24, 1940  DOS: 08/09/2021  Location: Office  ?MRN: [id number]  Setting: Ambulatory outpatient  Referring Provider: Leone Haven, MD  ?Type: Established Patient  Specialty: Interventional Pain Management  PCP: Leone Haven, MD  ?Location: Remote location  Delivery: TeleHealth    ? ?Virtual Encounter - Pain Management ?PROVIDER NOTE: Information contained herein reflects review and annotations entered in association with encounter. Interpretation of such information and data should be left to medically-trained personnel. Information provided to patient can be located elsewhere in the medical record under "Patient Instructions". Document created using STT-dictation technology, any transcriptional errors that may result from process are unintentional.  ?  ?Contact & Pharmacy ?Preferred: [phone] ?Home: [phone] (home) ?Mobile: [phone] (mobile) ?E-mail: [email or web address]  ?[facility] [name], [address] ?[place] ?[place] [address] ?Phone: [phone] Fax: [phone] ? ?[id number] [name] ?[address] ?[place] [address] ?Phone: [phone] Fax: [phone] ? ?[place], [address] ?[place] [address] ?Phone: [phone] Fax: [phone] ? ?[facility] [place] ?[place] [address] ?Phone: [phone] Fax: [phone] ? ?[facility] - [place], [address] ?[place] [address] ?Phone: [phone] Fax: [phone] ?  ?Pre-screening  ?James Hood offered "in-person" vs "virtual" encounter. He indicated preferring virtual for this encounter.  ? ?Reason ?COVID-19*  Social distancing based on CDC and AMA  recommendations.  ? ?I contacted James Hood on 08/09/2021 via telephone.      I clearly identified myself as [name], MD. I verified that I was speaking with the correct person using two identifiers (Name: James Hood, and date of birth: 07/02/1940). ? ?Consent ?I sought verbal advanced consent from James Hood for virtual visit interactions. I informed James Hood of possible security and privacy concerns, risks, and limitations associated with providing "not-in-person" medical evaluation and management services. I also informed James Hood of the availability of "in-person" appointments. Finally, I informed him that there would be a charge for the virtual visit and that he could be  personally, fully or partially, financially responsible for it. James Hood expressed understanding and agreed to proceed.  ? ?Historic Elements   ?James Hood is a 81 y.o. year old, male patient evaluated today after our last contact on 07/12/2021. 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 James Hood has a current medication list which includes  the following prescription(s): aspirin ec, budesonide, [START ON 08/23/2021] bupropion, freestyle libre 14 day sensor, dapagliflozin propanediol, divalproex, donepezil, doxycycline, fenofibrate, ferrous sulfate, gabapentin, hydrocodone-acetaminophen, [START ON 09/03/2021] hydrocodone-acetaminophen, hyoscyamine sulfate sl, ipratropium, lisinopril-hydrochlorothiazide, memantine, metformin, metoprolol succinate, multiple vitamins-minerals, omega-3 acid ethyl esters, omeprazole, primidone, promethazine-dextromethorphan, quetiapine, rosuvastatin, ozempic (1 mg/dose), tamsulosin, trazodone, varenicline, and vitamin c. He  reports that he has been smoking cigarettes. He has a 30.00 pack-year smoking history. He has never used smokeless tobacco. He reports that he does not currently use alcohol. He reports that he does not use drugs. James Hood has No Known Allergies.  ? ?HPI  ?Today, he is being contacted for a post-procedure assessment. ? ? ?Post-procedure evaluation  ?  Type: Lumbar Facet, Medial Branch Block(s) #1  ?Laterality: Bilateral  ?Level:L3, L4, L5, Medial Branch Level(s). Injecting these levels blocks the L3-4 and L5-S1 lumbar facet joints. ?Imaging: Fluoroscopic guidance ?Anesthesia: Local anesthesia (1-2% Lidocaine) ?Anxiolysis: IV Valium 1.0 mg ?Sedation:  minimal . ?DOS: 07/12/2021 ?Performed by: [name], MD ? ?Primary Purpose: Diagnostic/Therapeutic ?Indications: Low back pain severe enough to impact quality of life or function. ?1. Lumbar facet joint syndrome   ?2. Facet arthritis of lumbar region   ?3. Chronic pain syndrome   ? ?NAS-11 Pain score:  ? Pre-procedure: 3 /10  ? Post-procedure: 2 /10  ? ?   ?Effectiveness:  ?Initial hour after procedure: 100 %  ?Subsequent 4-6 hours  post-procedure: 100 %  ?Analgesia past initial 6 hours: 0 %  ?Ongoing improvement:  ?Analgesic:  0% ?Function: Back to baseline ?ROM: Back to baseline ? ? ?Pharmacotherapy Assessment  ? ?Opioid Analgesic: Hydrocodone 10 mg every 4 hours as needed, quantity 180/month; MME equals 60   ? ?Monitoring: ?[place] PMP: PDMP not reviewed this encounter.       ?Pharmacotherapy: No side-effects or adverse reactions reported. ?Compliance: No problems identified. ?Effectiveness: Clinically acceptable. ?Plan: Refer to "POC". UDS:  ?Summary  ?Date Value Ref Range Status  ?11/15/2020 Note  Final  ?  Comment:  ?  ==================================================================== ?[employer] Select 13 (MW) ?==================================================================== ?Test                             Result       Flag       Units ? ?Drug Present and Declared for Prescription Verification ?  Hydrocodone                    1623         EXPECTED   ng/mg creat ?  Hydromorphone                  226          EXPECTED   ng/mg creat ?  Dihydrocodeine                 105          EXPECTED   ng/mg creat ?  Norhydrocodone                 3285         EXPECTED   ng/mg creat ?   Sources of hydrocodone include scheduled prescription medications. ?   Hydromorphone, dihydrocodeine and norhydrocodone are expected ?   metabolites of hydrocodone. Hydromorphone and dihydrocodeine are ?   also available as scheduled prescription medications. ? ?  Phenobarbital  PRESENT      EXPECTED ?   Phenobarbital is an expected metabolite of primidone; Phenobarbital ?   may also be administered as a prescription drug. ? ?==================================================================== ?Test                      Result    Flag   Units      Ref Range ?  Creatinine              74               mg/dL      >=20 ?==================================================================== ?Declared Medications: ? The flagging and interpretation on this  report are based on the ? following declared medications.  Unexpected results may arise from ? inaccuracies in the declared medications. ? ? **Note: The testing scope of this panel includes these medications: ? ? Hydroc

## 2021-08-09 NOTE — Assessment & Plan Note (Signed)
Left ankle pain related to left ankle osteoarthritis.  Patient is requesting a repeat left ankle injection that was done 11/23/2020 which provided the patient with approximately 60% pain relief for 8 months.:  ? ?Type: Diagnostic True Ankle Steroid Injection                 ?Region: Dorsal             ?Target Area: Transverse tarsal joint ?Level: Ankle ?Laterality: Left ? ?We will repeat. ? ?Orders Placed This Encounter  ?Procedures  ?? Medium Joint Injection/Arthrocentesis  ?  Left ankle injection  ?  Standing Status:   Future  ?  Standing Expiration Date:   08/10/2022  ?  Scheduling Instructions:  ?   Left ankle injection, dorsal transverse tarsal joint.  ? ? ?

## 2021-08-14 ENCOUNTER — Other Ambulatory Visit: Payer: Self-pay | Admitting: Student in an Organized Health Care Education/Training Program

## 2021-08-14 ENCOUNTER — Encounter: Payer: Self-pay | Admitting: Student in an Organized Health Care Education/Training Program

## 2021-08-14 ENCOUNTER — Other Ambulatory Visit: Payer: Self-pay

## 2021-08-14 ENCOUNTER — Ambulatory Visit
Payer: PPO | Attending: Student in an Organized Health Care Education/Training Program | Admitting: Student in an Organized Health Care Education/Training Program

## 2021-08-14 VITALS — BP 123/71 | Temp 98.8°F | Resp 16 | Ht 70.0 in | Wt 170.0 lb

## 2021-08-14 DIAGNOSIS — M19072 Primary osteoarthritis, left ankle and foot: Secondary | ICD-10-CM | POA: Diagnosis not present

## 2021-08-14 DIAGNOSIS — K5903 Drug induced constipation: Secondary | ICD-10-CM

## 2021-08-14 MED ORDER — LIDOCAINE HCL 2 % IJ SOLN
20.0000 mL | Freq: Once | INTRAMUSCULAR | Status: AC
  Administered 2021-08-14: 400 mg

## 2021-08-14 MED ORDER — LIDOCAINE HCL 2 % IJ SOLN
INTRAMUSCULAR | Status: AC
  Filled 2021-08-14: qty 20

## 2021-08-14 MED ORDER — ROPIVACAINE HCL 2 MG/ML IJ SOLN
2.0000 mL | Freq: Once | INTRAMUSCULAR | Status: AC
  Administered 2021-08-14: 2 mL via EPIDURAL

## 2021-08-14 MED ORDER — ROPIVACAINE HCL 2 MG/ML IJ SOLN
INTRAMUSCULAR | Status: AC
  Filled 2021-08-14: qty 20

## 2021-08-14 MED ORDER — DEXAMETHASONE SODIUM PHOSPHATE 10 MG/ML IJ SOLN
10.0000 mg | Freq: Once | INTRAMUSCULAR | Status: AC
  Administered 2021-08-14: 10 mg

## 2021-08-14 MED ORDER — DEXAMETHASONE SODIUM PHOSPHATE 10 MG/ML IJ SOLN
INTRAMUSCULAR | Status: AC
  Filled 2021-08-14: qty 1

## 2021-08-14 NOTE — Addendum Note (Signed)
Addended by: [name] on: 08/14/2021 09:50 AM ? ? Modules accepted: Orders ? ?

## 2021-08-14 NOTE — Progress Notes (Signed)
Safety precautions to be maintained throughout the outpatient stay will include: orient to surroundings, keep bed in low position, maintain call bell within reach at all times, provide assistance with transfer out of bed and ambulation.  

## 2021-08-14 NOTE — Progress Notes (Addendum)
PROVIDER NOTE: Information contained herein reflects review and annotations entered in association with encounter. Interpretation of such information and data should be left to medically-trained personnel. Information provided to patient can be located elsewhere in the medical record under "Patient Instructions". Document created using STT-dictation technology, any transcriptional errors that may result from process are unintentional.  ?  ?Patient: James Hood  Service Category: Procedure  Provider: [name], MD  ?DOB: [birth year]  DOS: 08/14/2021  Location: [facility]  ?MRN: [id number]  Setting: Ambulatory - outpatient  Referring Provider: Leone Haven, MD  ?Type: Established Patient  Specialty: Interventional Pain Management  PCP: Leone Haven, MD  ? ?Primary Reason for Visit: Interventional Pain Management Treatment. ?CC: Ankle Pain (left) ? ? ?Procedure:          Anesthesia, Analgesia, Anxiolysis:  ?Type: Diagnostic True Ankle Steroid Injection        #2  ?Region: Dorsal             ?Target Area: Transverse tarsal joint ?Level: Ankle ?Laterality: Left  Type: Local Anesthesia ?Indication(s): Analgesia         ?Local Anesthetic: Lidocaine 1-2% ?Route: Infiltration ([place]/IM) ?IV Access: Declined ?Sedation: Declined  ? ?Position: Supine  ? ?Indications: ?1. Primary osteoarthritis of left ankle   ? ? ?Pain Score: ?Pre-procedure: 5 /10 ?Post-procedure: 5 /10  ? ?Pre-op H&P Assessment:  ?Mr. Lasky is a 81 y.o. (year old), male patient, seen today for interventional treatment. He  has a past surgical history that includes esophageal stretch; Tonsillectomy; Total knee arthroplasty (Right, 11/15/2014); Lumbar laminectomy/decompression microdiscectomy (Left, 02/03/2016); Colonoscopy with propofol (N/A, 09/14/2016); Joint replacement (Right, 2016); Pulse generator implant (N/A, 08/21/2017); Back surgery; Pulse generator implant (Right, 04/02/2018); Ankle arthroscopy (Left, 09/29/2019); and  Esophagogastroduodenoscopy (egd) with propofol (N/A, 12/13/2020). Mr. Ramsaran has a current medication list which includes the following prescription(s): aspirin ec, budesonide, [START ON 08/23/2021] bupropion, freestyle libre 14 day sensor, dapagliflozin propanediol, divalproex, donepezil, doxycycline, fenofibrate, ferrous sulfate, gabapentin, hydrocodone-acetaminophen, [START ON 09/03/2021] hydrocodone-acetaminophen, hyoscyamine sulfate sl, ipratropium, lisinopril-hydrochlorothiazide, memantine, metformin, metoprolol succinate, multiple vitamins-minerals, omega-3 acid ethyl esters, omeprazole, primidone, promethazine-dextromethorphan, quetiapine, rosuvastatin, ozempic (1 mg/dose), tamsulosin, trazodone, varenicline, and vitamin c, and the following Facility-Administered Medications: dexamethasone, lidocaine, and ropivacaine (pf) 2 mg/ml (0.2%). His primarily concern today is the Ankle Pain (left) ? ? ?Initial Vital Signs:  ?Pulse/HCG Rate:    ?Temp: 98.8 ?F (37.1 ?C) ?Resp: 16 ?BP: 123/71 ?SpO2: (!) 71 % ? ?BMI: Estimated body mass index is 24.39 kg/m? as calculated from the following: ?  Height as of this encounter: '5\' 10"'$  (1.778 m). ?  Weight as of this encounter: 170 lb (77.1 kg). ? ?Risk Assessment: ?Allergies: Reviewed. He has No Known Allergies.  ?Allergy Precautions: None required ?Coagulopathies: Reviewed. None identified.  ?Blood-thinner therapy: None at this time ?Active Infection(s): Reviewed. None identified. Mr. Suman is afebrile ? ?Site Confirmation: Mr. Craine was asked to confirm the procedure and laterality before marking the site ?Procedure checklist: Completed ?Consent: Before the procedure and under the influence of no sedative(s), amnesic(s), or anxiolytics, the patient was informed of the treatment options, risks and possible complications. To fulfill our ethical and legal obligations, as recommended by the American Medical Association's Code of Ethics, I have informed the patient of my  clinical impression; the nature and purpose of the treatment or procedure; the risks, benefits, and possible complications of the intervention; the alternatives, including doing nothing; the risk(s) and benefit(s) of the alternative treatment(s) or procedure(s); and  the risk(s) and benefit(s) of doing nothing. ?The patient was provided information about the general risks and possible complications associated with the procedure. These may include, but are not limited to: failure to achieve desired goals, infection, bleeding, organ or nerve damage, allergic reactions, paralysis, and death. ?In addition, the patient was informed of those risks and complications associated to the procedure, such as failure to decrease pain; infection; bleeding; organ or nerve damage with subsequent damage to sensory, motor, and/or autonomic systems, resulting in permanent pain, numbness, and/or weakness of one or several areas of the body; allergic reactions; (i.e.: anaphylactic reaction); and/or death. ?Furthermore, the patient was informed of those risks and complications associated with the medications. These include, but are not limited to: allergic reactions (i.e.: anaphylactic or anaphylactoid reaction(s)); adrenal axis suppression; blood sugar elevation that in diabetics may result in ketoacidosis or comma; water retention that in patients with history of congestive heart failure may result in shortness of breath, pulmonary edema, and decompensation with resultant heart failure; weight gain; swelling or edema; medication-induced neural toxicity; particulate matter embolism and blood vessel occlusion with resultant organ, and/or nervous system infarction; and/or aseptic necrosis of one or more joints. ?Finally, the patient was informed that Medicine is not an exact science; therefore, there is also the possibility of unforeseen or unpredictable risks and/or possible complications that may result in a catastrophic outcome. The  patient indicated having understood very clearly. We have given the patient no guarantees and we have made no promises. Enough time was given to the patient to ask questions, all of which were answered to the patient's satisfaction. Mr. Edgin has indicated that he wanted to continue with the procedure. ?Attestation: I, the ordering provider, attest that I have discussed with the patient the benefits, risks, side-effects, alternatives, likelihood of achieving goals, and potential problems during recovery for the procedure that I have provided informed consent. ?Date  Time: 08/14/2021  8:18 AM ? ?Pre-Procedure Preparation:  ?Monitoring: As per clinic protocol. Respiration, ETCO2, SpO2, BP, heart rate and rhythm monitor placed and checked for adequate function ?Safety Precautions: Patient was assessed for positional comfort and pressure points before starting the procedure. ?Time-out: I initiated and conducted the "Time-out" before starting the procedure, as per protocol. The patient was asked to participate by confirming the accuracy of the "Time Out" information. Verification of the correct person, site, and procedure were performed and confirmed by me, the nursing staff, and the patient. "Time-out" conducted as per Joint Commission's Universal Protocol (UP.01.01.01). ?Time: 0350 ? ?Description of Procedure:          ?Approach: Dorsal approach. ?Area Prepped: Entire dorsal foot Region ?DuraPrep (Iodine Povacrylex [0.7% available iodine] and Isopropyl Alcohol, 74% w/w) ?Safety Precautions: Aspiration looking for blood return was conducted prior to all injections. At no point did we inject any substances, as a needle was being advanced. No attempts were made at seeking any paresthesias. Safe injection practices and needle disposal techniques used. Medications properly checked for expiration dates. SDV (single dose vial) medications used. ?Description of the Procedure: Protocol guidelines were followed. The patient  was placed in position. The target area was identified and the area prepped in the usual manner. Skin & deeper tissues infiltrated with local anesthetic. Appropriate amount of time allowed to pass for local anes

## 2021-08-15 ENCOUNTER — Telehealth: Payer: Self-pay | Admitting: *Deleted

## 2021-08-15 NOTE — Telephone Encounter (Signed)
No problems post procedure. 

## 2021-08-21 ENCOUNTER — Other Ambulatory Visit: Payer: Self-pay | Admitting: Psychiatry

## 2021-08-21 ENCOUNTER — Telehealth: Payer: Self-pay

## 2021-08-21 MED ORDER — DOXEPIN HCL 10 MG PO CAPS: 10.0000 mg | ORAL_CAPSULE | Freq: Every evening | ORAL | 1 refills | Status: DC | PRN

## 2021-08-21 NOTE — Telephone Encounter (Signed)
Discussed with the patient.  He has not been taking quetiapine for the past week. (He states that he filled quetiapine 125 mg at the pharmacy for seven months, although he has not tried this yet).  There were some days he could not sleep at all despite he does not take a nap.  He tried trazodone 300 mg, which was not helpful.  Discussed the importance of medication adherence.  He is willing to try the medication as below. Discussed the risk of rebound insomnia, drowsiness, and fall  ?- start doxepin 10 mg at night as needed for sleep ?- discontinue Trazodone

## 2021-08-21 NOTE — Telephone Encounter (Signed)
pt called left message that he needs to speak with you .  he states that he is still not able to sleep and when he seen you last time you told him to call if he could not sleep.  ?

## 2021-08-22 ENCOUNTER — Telehealth: Payer: Self-pay

## 2021-08-22 NOTE — Telephone Encounter (Signed)
I called and spoke with the patient and Informed him that his Ozempic has arrived from patient assistance and he could pick it up and I informed him we will be closed Friday for a holiday and he understood.  James Hood,cma  ?

## 2021-08-23 DIAGNOSIS — J019 Acute sinusitis, unspecified: Secondary | ICD-10-CM | POA: Diagnosis not present

## 2021-08-24 ENCOUNTER — Ambulatory Visit (INDEPENDENT_AMBULATORY_CARE_PROVIDER_SITE_OTHER): Payer: PPO | Admitting: Licensed Clinical Social Worker

## 2021-08-24 DIAGNOSIS — F3341 Major depressive disorder, recurrent, in partial remission: Secondary | ICD-10-CM | POA: Diagnosis not present

## 2021-08-24 NOTE — Plan of Care (Signed)
?  Problem: Decrease depressive symptoms and improve levels of effective functioning ?Goal: LTG: Reduce frequency, intensity, and duration of depression symptoms as evidenced by: pt self report ?Outcome: Progressing ?Goal: STG: '@PREFFIRSTNAME'$ @ will participate in at least 80% of scheduled individual psychotherapy sessions ?Outcome: Progressing ?Intervention: Encourage verbalization of feelings/concerns/expectations ?Intervention: REVIEW PLEASE SKILLS (TREAT PHYSICAL ILLNESS, BALANCE EATING, AVOID MOOD-ALTERING SUBSTANCES, BALANCE SLEEP AND GET EXERCISE) WITH James Hood "James Hood" ?Intervention: Assist with coping skills and behavior ?  ?

## 2021-08-24 NOTE — Progress Notes (Addendum)
? ?  THERAPIST PROGRESS NOTE ? ?Session Time: 8-9am ? ?[facility] in office visit for patient and LCSW clinician ? ? ?Participation Level: Active ? ?Behavioral Response: Neat and Well GroomedAlertAnxious ? ?Type of Therapy: Individual Therapy ? ?Treatment Goals addressed:  ?Problem: Decrease depressive symptoms and improve levels of effective functioning ?Goal: LTG: Reduce frequency, intensity, and duration of depression symptoms as evidenced by: pt self report ?Outcome: Progressing ?Goal: STG: '@PREFFIRSTNAME'$ @ will participate in at least 80% of scheduled individual psychotherapy sessions ?Outcome: Progressing ?Intervention: Encourage verbalization of feelings/concerns/expectations ?Intervention: REVIEW PLEASE SKILLS (TREAT PHYSICAL ILLNESS, BALANCE EATING, AVOID MOOD-ALTERING SUBSTANCES, BALANCE SLEEP AND GET EXERCISE) WITH James Hood "James Hood" ?Intervention: Assist with coping skills and behavior ?  ?ProgressTowards Goals: Progressing ? ?Interventions: CBT (see above) ? ?Summary: James Hood is a 81 y.o. male who presents with improving symptoms related to his depression diagnosis. Pt reports overall mood is stable and that he is managing situational stressors better.  ? ?Allowed pt to explore and express thoughts and feelings associated with recent life situations and external stressors. Allowed patient to explore medication issues that he is currently experiencing (sleep meds). Patient reports that he has a new medication that he's trying today, so he is hopeful that his sleep quality and quantity will improve.  ? ?Patient reports ongoing financial concerns, and discussed some recent decisions that he and his wife have made to improve their financial situation. Patient reports that they have a budget, and he is willing to make some changes in his own lifestyle to accommodate set budget.  ? ?Patient reports that he is continuing to attend AA meetings, and is also seeing his regular sponsees. Explored thoughts and feelings  associated with a recent birthday party for his wife, and how happy his wife was at the birthday party. Explored patient's relationship with his son, and grandson. Discussed pts thoughts on spirituality, and overall psychological impact of spirituality. ? ?Continued recommendations are as follows: self care behaviors, positive social engagements, focusing on overall work/home/life balance, and focusing on positive physical and emotional wellness.  ? ? ?Suicidal/Homicidal: No ? ?Therapist Response: Pt is continuing to apply interventions learned in session into daily life situations. Pt is currently on track to meet goals utilizing interventions mentioned above. Personal growth and progress noted. Treatment to continue as indicated.  ? ?Plan: Return again in 4 weeks. ? ?Diagnosis: MDD (major depressive disorder), recurrent, in partial remission ([place]) ? ?Collaboration of Care: Other Pt encouraged to continue psychiatric care with Dr. Modesta Messing ? ?Patient/Guardian was advised Release of Information must be obtained prior to any record release in order to collaborate their care with an outside provider. Patient/Guardian was advised if they have not already done so to contact the registration department to sign all necessary forms in order for [place] to release information regarding their care.  ? ?Consent: Patient/Guardian gives verbal consent for treatment and assignment of benefits for services provided during this visit. Patient/Guardian expressed understanding and agreed to proceed.  ? ?James Lewis, LCSW ?08/24/2021 ? ?

## 2021-08-29 DIAGNOSIS — R197 Diarrhea, unspecified: Secondary | ICD-10-CM | POA: Diagnosis not present

## 2021-08-31 ENCOUNTER — Telehealth: Payer: Self-pay

## 2021-08-31 ENCOUNTER — Other Ambulatory Visit: Payer: Self-pay

## 2021-08-31 DIAGNOSIS — E1142 Type 2 diabetes mellitus with diabetic polyneuropathy: Secondary | ICD-10-CM

## 2021-08-31 MED ORDER — OZEMPIC (1 MG/DOSE) 4 MG/3ML ~~LOC~~ SOPN: 1.0000 mg | PEN_INJECTOR | SUBCUTANEOUS | 1 refills | Status: DC

## 2021-08-31 NOTE — Progress Notes (Signed)
A new Rx for Ozempic was printed for patient assistance application.  ng ?

## 2021-09-01 NOTE — Telephone Encounter (Signed)
Signed.

## 2021-09-06 ENCOUNTER — Other Ambulatory Visit: Payer: Self-pay | Admitting: Family Medicine

## 2021-09-08 ENCOUNTER — Telehealth: Payer: Self-pay | Admitting: Family Medicine

## 2021-09-08 NOTE — Telephone Encounter (Signed)
Patient says when he woke this morning his CBG 48 he went to eat breakfast at [employer] had pancakes and grits CBG now is 145, advised patient he should not have driven , and that he should keep some small snacks at home he can eat and orange juice. Patient says he is taking Ozempic and was doing great with no lows until he stepped up to the 1 mg dose at 0.75 he never had lows and he has noticed since being on 1 mg his CBGs have been in the 60's and 70's. Yesterday patient did admit he skipped lunch and only had breakfast and light dinner is why is CBG might have been 48 this morning. ?Patient ask should he go back to 0.75 mg and stay there for now. ?

## 2021-09-08 NOTE — Telephone Encounter (Signed)
Called and spoke with patient he will go back to Ozempic 0.75 mg weekly and monitor CBG closely also sent patient new link to join back to [place] [facility] so PCP can see sugar trends if needed. ?

## 2021-09-08 NOTE — Telephone Encounter (Signed)
At this point he should go back to the 0.75 mg dose of the ozempic. We can reassess at his next follow-up, but if he continues to have lows despite reducing the dose he should let us know right away. ?

## 2021-09-08 NOTE — Telephone Encounter (Signed)
Noted  

## 2021-09-13 DIAGNOSIS — R251 Tremor, unspecified: Secondary | ICD-10-CM | POA: Diagnosis not present

## 2021-09-13 DIAGNOSIS — R4189 Other symptoms and signs involving cognitive functions and awareness: Secondary | ICD-10-CM | POA: Diagnosis not present

## 2021-09-13 DIAGNOSIS — G2581 Restless legs syndrome: Secondary | ICD-10-CM | POA: Diagnosis not present

## 2021-09-13 DIAGNOSIS — G479 Sleep disorder, unspecified: Secondary | ICD-10-CM | POA: Diagnosis not present

## 2021-09-13 DIAGNOSIS — R262 Difficulty in walking, not elsewhere classified: Secondary | ICD-10-CM | POA: Diagnosis not present

## 2021-09-13 DIAGNOSIS — F17200 Nicotine dependence, unspecified, uncomplicated: Secondary | ICD-10-CM | POA: Diagnosis not present

## 2021-09-13 DIAGNOSIS — M25572 Pain in left ankle and joints of left foot: Secondary | ICD-10-CM | POA: Diagnosis not present

## 2021-09-14 ENCOUNTER — Telehealth: Payer: Self-pay

## 2021-09-14 NOTE — Telephone Encounter (Signed)
I called and informed the patient that his Ozempic came in and is available for pickup, he stated he had a lab appointment on Tuesday of next week and will get it then.  James Hood,cma  ?

## 2021-09-15 DIAGNOSIS — R091 Pleurisy: Secondary | ICD-10-CM | POA: Diagnosis not present

## 2021-09-19 ENCOUNTER — Other Ambulatory Visit (INDEPENDENT_AMBULATORY_CARE_PROVIDER_SITE_OTHER): Payer: PPO

## 2021-09-19 ENCOUNTER — Telehealth: Payer: PPO | Admitting: Psychiatry

## 2021-09-19 DIAGNOSIS — E559 Vitamin D deficiency, unspecified: Secondary | ICD-10-CM

## 2021-09-19 DIAGNOSIS — E785 Hyperlipidemia, unspecified: Secondary | ICD-10-CM | POA: Diagnosis not present

## 2021-09-19 LAB — HEPATIC FUNCTION PANEL
ALT: 14 U/L (ref 0–53)
AST: 21 U/L (ref 0–37)
Albumin: 4.3 g/dL (ref 3.5–5.2)
Alkaline Phosphatase: 46 U/L (ref 39–117)
Bilirubin, Direct: 0.1 mg/dL (ref 0.0–0.3)
Total Bilirubin: 0.3 mg/dL (ref 0.2–1.2)
Total Protein: 6.6 g/dL (ref 6.0–8.3)

## 2021-09-19 LAB — LDL CHOLESTEROL, DIRECT: Direct LDL: 35 mg/dL

## 2021-09-19 LAB — VITAMIN D 25 HYDROXY (VIT D DEFICIENCY, FRACTURES): VITD: 25.99 ng/mL — ABNORMAL LOW (ref 30.00–100.00)

## 2021-09-19 NOTE — Telephone Encounter (Signed)
Pt picked up medication.

## 2021-09-23 NOTE — Progress Notes (Signed)
[place] MD/PA/NP OP Progress Note ? ?09/25/2021 2:35 PM ?James Hood  ?MRN:  [id number] ? ?Chief Complaint:  ?Chief Complaint  ?Patient presents with  ? Follow-up  ? ?HPI:  ?This is a follow-up appointment for depression and insomnia.  ?He states that he has been doing well.  He has ankle pain.  Although he takes hydrocodone, it does not alleviate his pain at all.  He talks about his frustration of not being able to receive more medication.  However, he states that nothing bad is going.  He continues to go to [employer], which has been a "life saver."  Although he has craving at times when he watches TV commercial, he thinks about the time he was drinking, and he denies any intention to drink again.  He finds AA meeting to be very helpful.  He denies feeling depressed or anxious.  He denies anhedonia. He denies change in appetite or weight gain.  He sleeps well with a combination of quetiapine and trazodone.  He discontinued the doxepin as it did not work for him.  He has not noticed any side effect from quetiapine, and would like to stay on this medication.  He denies SI.  ? ?Wt Readings from Last 3 Encounters:  ?09/25/21 170 lb 12.8 oz (77.5 kg)  ?08/14/21 170 lb (77.1 kg)  ?08/08/21 172 lb 6.4 oz (78.2 kg)  ?  ? ? ?Visit Diagnosis:  ?  ICD-10-CM   ?1. MDD (major depressive disorder), recurrent, in full remission ([place])  F33.42   ?  ?2. Insomnia, unspecified type  G47.00   ?  ? ? ?Past Psychiatric History: Please see initial evaluation for full details. I have reviewed the history. No updates at this time.  ?  ? ?Past Medical History:  ?Past Medical History:  ?Diagnosis Date  ? Anxiety   ? Anxiety and depression   ? Arthritis   ? BPH (benign prostatic hyperplasia)   ? 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 Colon polyps   ? Dementia ([place])   ? Depression   ? Diabetes mellitus type 2 in nonobese [facility])   ? GERD (gastroesophageal reflux disease)   ? BARRETTS ESOPHAGUS RESOLVED PER PATIENT  ?  Headache   ? 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 History of hiatal hernia   ? Hypercholesteremia   ? Hypertension   ? Hyperthyroidism   ? Neuropathic pain   ? Primary localized osteoarthritis of right knee   ? 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 Stomach ulcer   ? Tremor, essential   ? Wound infection complicating hardware ([place]) 04/02/2018  ?  ?Past Surgical History:  ?Procedure Laterality Date  ? ANKLE ARTHROSCOPY Left 09/29/2019  ? Procedure: LEFT ANKLE ARTHROSCOPY, DEBRIDEMENT;  Surgeon: Newt Minion, MD;  Location: [place];  Service: Orthopedics;  Laterality: Left;  ? BACK SURGERY    ? COLONOSCOPY WITH PROPOFOL N/A 09/14/2016  ? Procedure: COLONOSCOPY WITH PROPOFOL;  Surgeon: Manya Silvas, MD;  Location: [facility] ENDOSCOPY;  Service: Endoscopy;  Laterality: N/A;  ? esophageal stretch    ? ESOPHAGOGASTRODUODENOSCOPY (EGD) WITH PROPOFOL N/A 12/13/2020  ? Procedure: ESOPHAGOGASTRODUODENOSCOPY (EGD) WITH PROPOFOL;  Surgeon: Lesly Rubenstein, MD;  Location: [facility] ENDOSCOPY;  Service: Endoscopy;  Laterality: N/A;  IDDM  ? JOINT REPLACEMENT Right 2016  ? knee  ? LUMBAR LAMINECTOMY/DECOMPRESSION MICRODISCECTOMY Left 02/03/2016  ? Procedure: LEFT L5-S1 DISKECTOMY;  Surgeon: Leeroy Cha, MD;  Location: [place]  ORS;  Service: Neurosurgery;  Laterality: Left;  LEFT L5-S1 DISKECTOMY  ? PULSE GENERATOR IMPLANT N/A 08/21/2017  ? Procedure: UNILATERAL PULSE GENERATOR IMPLANT;  Surgeon: Meade Maw, MD;  Location: [facility] ORS;  Service: Neurosurgery;  Laterality: N/A;  ? PULSE GENERATOR IMPLANT Right 04/02/2018  ? Procedure: REMOVAL OF PULSE GENERATOR IMPLANT AND LEADS;  Surgeon: Meade Maw, MD;  Location: [facility] ORS;  Service: Neurosurgery;  Laterality: Right;  ? TONSILLECTOMY    ? TOTAL KNEE ARTHROPLASTY Right 11/15/2014  ? Procedure: TOTAL KNEE ARTHROPLASTY;  Surgeon: Elsie Saas, MD;  Location: [place];  Service: Orthopedics;  Laterality: Right;  ? ? ?Family Psychiatric History: Please  see initial evaluation for full details. I have reviewed the history. No updates at this time.  ?  ? ?Family History:  ?Family History  ?Problem Relation Age of Onset  ? Heart attack Mother   ? Heart disease Mother   ? Hypertension Father   ? Depression Father   ? Kidney cancer Neg Hx   ? Prostate cancer Neg Hx   ? ? ?Social History:  ?Social History  ? ?Socioeconomic History  ? Marital status: Married  ?  Spouse name: Not on file  ? Number of children: Not on file  ? Years of education: Not on file  ? Highest education level: Not on file  ?Occupational History  ? Not on file  ?Tobacco Use  ? Smoking status: Every Day  ?  Packs/day: 0.50  ?  Years: 60.00  ?  Pack years: 30.00  ?  Types: Cigarettes  ? Smokeless tobacco: Never  ?Vaping Use  ? Vaping Use: Former  ?Substance and Sexual Activity  ? Alcohol use: Not Currently  ?  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 Drug use: Never  ? Sexual activity: Not Currently  ?Other Topics Concern  ? Not on file  ?Social History Narrative  ? Not on file  ? ?Social Determinants of Health  ? ?Financial Resource Strain: Medium Risk  ? Difficulty of Paying Living Expenses: Somewhat hard  ?Food Insecurity: Not on file  ?Transportation Needs: Not on file  ?Physical Activity: Not on file  ?Stress: Not on file  ?Social Connections: Not on file  ? ? ?Allergies: No Known Allergies ? ?Metabolic Disorder Labs: ?Lab Results  ?Component Value Date  ? HGBA1C 7.4 (H) 08/02/2021  ? MPG 131 01/25/2016  ? MPG 166 11/05/2014  ? ?No results found for: PROLACTIN ?Lab Results  ?Component Value Date  ? CHOL 186 08/02/2021  ? TRIG 304.0 (H) 08/02/2021  ? HDL 59.10 08/02/2021  ? CHOLHDL 3 08/02/2021  ? VLDL 60.8 (H) 08/02/2021  ? [place] 57 09/25/2017  ? ?Lab Results  ?Component Value Date  ? TSH 1.08 08/02/2021  ? TSH 0.97 10/11/2020  ? ? ?Therapeutic Level Labs: ?No results found for: LITHIUM ?No results found for: VALPROATE ?No components found for:  CBMZ ? ?Current Medications: ?Current  Outpatient Medications  ?Medication Sig Dispense Refill  ? aspirin EC 81 MG tablet Take 81 mg by mouth daily.    ? budesonide (ENTOCORT EC) 3 MG 24 hr capsule Take 2 capsules (6 mg total) by mouth daily. 180 capsule 0  ? buPROPion (WELLBUTRIN XL) 300 MG 24 hr tablet Take 1 tablet (300 mg total) by mouth daily. 90 tablet 0  ? Continuous Blood Gluc Sensor (FREESTYLE LIBRE 14 DAY SENSOR) MISC APPLY 1 SENSOR EVERY 14 DAYS TO MONITOR BLOOD GLUCOSE LEVELS. REMOVE OLD SENSOR BEFORE APPLYING NEW SENSOR 2  each 5  ? dapagliflozin propanediol (FARXIGA) 10 MG TABS tablet Take 1 tablet (10 mg total) by mouth daily. 90 tablet 3  ? divalproex (DEPAKOTE ER) 500 MG 24 hr tablet Take 1 tablet by mouth 2 (two) times daily.    ? donepezil (ARICEPT) 10 MG tablet Take 10 mg by mouth at bedtime.    ? doxycycline (VIBRA-TABS) 100 MG tablet Take 1 tablet (100 mg total) by mouth 2 (two) times daily. 20 tablet 0  ? fenofibrate (TRICOR) 145 MG tablet TAKE ONE TABLET BY MOUTH DAILY 90 tablet 1  ? ferrous sulfate 325 (65 FE) MG EC tablet Take 325 mg by mouth daily.    ? gabapentin (NEURONTIN) 600 MG tablet TAKE TWO TABLETS BY MOUTH THREE TIMES A DAY 180 tablet 1  ? HYDROcodone-acetaminophen (NORCO) 10-325 MG tablet Take 1 tablet by mouth every 6 (six) hours as needed for severe pain. Must last 30 days. 120 tablet 0  ? Hyoscyamine Sulfate SL 0.125 MG SUBL Place 1 tablet under the tongue daily as needed.    ? ipratropium (ATROVENT) 0.03 % nasal spray Place 2 sprays into both nostrils every 12 (twelve) hours. 30 mL 12  ? lisinopril-hydrochlorothiazide (ZESTORETIC) 20-25 MG tablet TAKE ONE TABLET BY MOUTH TWICE A DAY 180 tablet 1  ? memantine (NAMENDA) 10 MG tablet Take 1 tablet by mouth daily.    ? metFORMIN (GLUCOPHAGE-XR) 500 MG 24 hr tablet Take 1 tablet (500 mg total) by mouth 2 (two) times daily. 180 tablet 1  ? metoprolol succinate (TOPROL-XL) 25 MG 24 hr tablet Take 25 mg by mouth daily.    ? Multiple Vitamins-Minerals (CENTRUM SILVER PO)  Take 1 tablet by mouth daily.    ? omega-3 acid ethyl esters (LOVAZA) 1 g capsule Take 2 capsules (2 g total) by mouth 2 (two) times daily. 360 capsule 3  ? omeprazole (PRILOSEC) 20 MG capsule Take 20 mg by mo

## 2021-09-25 ENCOUNTER — Ambulatory Visit (INDEPENDENT_AMBULATORY_CARE_PROVIDER_SITE_OTHER): Payer: PPO | Admitting: Psychiatry

## 2021-09-25 ENCOUNTER — Encounter: Payer: Self-pay | Admitting: Psychiatry

## 2021-09-25 VITALS — BP 125/74 | HR 86 | Temp 98.5°F | Wt 170.8 lb

## 2021-09-25 DIAGNOSIS — G4700 Insomnia, unspecified: Secondary | ICD-10-CM

## 2021-09-25 DIAGNOSIS — F3342 Major depressive disorder, recurrent, in full remission: Secondary | ICD-10-CM

## 2021-09-25 NOTE — Patient Instructions (Signed)
Continue bupropion XR 300 mg daily  ?Continue Trazodone 200 mg at night as needed for insomnia  ?Next appointment: 7/6 at 8:30, in person ?

## 2021-09-26 ENCOUNTER — Ambulatory Visit
Payer: PPO | Attending: Student in an Organized Health Care Education/Training Program | Admitting: Student in an Organized Health Care Education/Training Program

## 2021-09-26 ENCOUNTER — Encounter: Payer: Self-pay | Admitting: Student in an Organized Health Care Education/Training Program

## 2021-09-26 VITALS — BP 119/51 | HR 41 | Temp 97.3°F | Ht 70.0 in | Wt 170.0 lb

## 2021-09-26 DIAGNOSIS — G894 Chronic pain syndrome: Secondary | ICD-10-CM | POA: Diagnosis not present

## 2021-09-26 DIAGNOSIS — M47816 Spondylosis without myelopathy or radiculopathy, lumbar region: Secondary | ICD-10-CM | POA: Diagnosis not present

## 2021-09-26 DIAGNOSIS — M25572 Pain in left ankle and joints of left foot: Secondary | ICD-10-CM | POA: Diagnosis not present

## 2021-09-26 DIAGNOSIS — T85192S Other mechanical complication of implanted electronic neurostimulator (electrode) of spinal cord, sequela: Secondary | ICD-10-CM | POA: Diagnosis not present

## 2021-09-26 DIAGNOSIS — G8929 Other chronic pain: Secondary | ICD-10-CM | POA: Insufficient documentation

## 2021-09-26 DIAGNOSIS — F1190 Opioid use, unspecified, uncomplicated: Secondary | ICD-10-CM | POA: Insufficient documentation

## 2021-09-26 MED ORDER — HYDROMORPHONE HCL 4 MG PO TABS: 4.0000 mg | ORAL_TABLET | Freq: Three times a day (TID) | ORAL | 0 refills | Status: AC | PRN

## 2021-09-26 NOTE — Progress Notes (Signed)
Nursing Pain Medication Assessment:  ?Safety precautions to be maintained throughout the outpatient stay will include: orient to surroundings, keep bed in low position, maintain call bell within reach at all times, provide assistance with transfer out of bed and ambulation.  ?Medication Inspection Compliance: Pill count conducted under aseptic conditions, in front of the patient. Neither the pills nor the bottle was removed from the patient's sight at any time. Once count was completed pills were immediately returned to the patient in their original bottle. ? ?Medication: Hydrocodone/APAP ?Pill/Patch Count:  39 of 120 pills remain ?Pill/Patch Appearance: Markings consistent with prescribed medication ?Bottle Appearance: Standard pharmacy container. Clearly labeled. ?Filled Date: 04 / 17 / 2023 ?Last Medication intake:  Today ?

## 2021-09-26 NOTE — Progress Notes (Signed)
PROVIDER NOTE: Information contained herein reflects review and annotations entered in association with encounter. Interpretation of such information and data should be left to medically-trained personnel. Information provided to patient can be located elsewhere in the medical record under "Patient Instructions". Document created using STT-dictation technology, any transcriptional errors that may result from process are unintentional.  ?  ?Patient: James Hood  Service Category: E/M  Provider: [name], MD  ?DOB: 1940/09/21  DOS: 09/26/2021  Specialty: Interventional Pain Management  ?MRN: [id number]  Setting: Ambulatory outpatient  PCP: Leone Haven, MD  ?Type: Established Patient    Referring Provider: Leone Haven, MD  ?Location: Office  Delivery: Face-to-face    ? ?HPI  ?[name], a 81 y.o. 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 Mr. Castleman's primary complain today is Ankle Pain (left) ? ?Last encounter: My last encounter with him was on 08/14/21 ? ?Pertinent problems: [name] has Primary localized osteoarthritis of right knee; DJD (degenerative joint disease) of knee; Chronic lumbar pain; Lumbar herniated disc; Chronic pain syndrome; Failed back surgical syndrome; Lumbosacral radiculopathy; Primary osteoarthritis of left ankle; Lumbar facet joint syndrome; Neuropathy; Chronic pain of left ankle; and Impingement of left ankle joint on their pertinent problem list. ?Pain Assessment: Severity of Chronic pain is reported as a 5 /10. Location: Ankle Left/up to left calf. Onset: More than a month ago. Quality: Aching, Constant. Timing: Constant. Modifying factor(s): nothing. ?Vitals:  height is '5\' 10"'$  (1.778 m) and weight is 170 lb (77.1 kg). His temporal temperature is 97.3 ?F (36.3 ?C) (abnormal). His blood pressure is 119/51 (abnormal) and his pulse is 41 (abnormal). His oxygen saturation is 96%.  ? ?Reason for encounter:  medication management.  ? ?[name] presents today for medication management and postprocedural evaluation.  He continues to have severe left ankle pain related to severe ankle osteoarthritis status post 2 transverse tarsal joint injections with limited response.  We discussed possible PRP injections into the transverse tarsal joints with Dr. Zigmund Daniel.  He states that he will think about this further and let us know. ? ?We spent a great deal of time discussing alternative for his current hydrocodone.  He has tried and failed oxycodone in the past.  Limited response for tramadol as well.  He is on hydrocodone 10 mg every 4 hours, MME equals 60.  We discussed an opioid rotation to hydromorphone 4 mg every 8 hours as needed.  Risk and benefits were reviewed.  Patient continues gabapentin 1200 mg 3 times a day. ? ? ?Pharmacotherapy Assessment  ?Analgesic: Hydrocodone 10 mg every 4 hours as needed, quantity 180/month; MME equals 60   ? ?Monitoring: ?[place] PMP: PDMP reviewed during this encounter.       ?Pharmacotherapy: No side-effects or adverse reactions reported. ?Compliance: No problems identified. ?Effectiveness: Clinically acceptable. ? ?[name], RN  09/26/2021  8:49 AM  Sign when Signing Visit ?Nursing Pain Medication Assessment:  ?Safety precautions to be maintained throughout the outpatient stay will include: orient to surroundings, keep bed in low position, maintain call bell within reach at all times, provide assistance with transfer out of bed and ambulation.  ?Medication Inspection Compliance: Pill count conducted under aseptic conditions, in front of the patient. Neither the pills nor the bottle was removed from the patient's sight at any time. Once count was completed pills were immediately returned to the patient in their original bottle. ? ?Medication: Hydrocodone/APAP ?Pill/Patch Count:  39 of 120  pills remain ?Pill/Patch Appearance: Markings consistent with prescribed medication ?Bottle Appearance:  Standard pharmacy container. Clearly labeled. ?Filled Date: 04 / 17 / 2023 ?Last Medication intake:  Today ?  ?  UDS:  ?Summary  ?Date Value Ref Range Status  ?11/15/2020 Note  Final  ?  Comment:  ?  ==================================================================== ?[facility] Select 13 (MW) ?==================================================================== ?Test                             Result       Flag       Units ? ?Drug Present and Declared for Prescription Verification ?  Hydrocodone                    1623         EXPECTED   ng/mg creat ?  Hydromorphone                  226          EXPECTED   ng/mg creat ?  Dihydrocodeine                 105          EXPECTED   ng/mg creat ?  Norhydrocodone                 3285         EXPECTED   ng/mg creat ?   Sources of hydrocodone include scheduled prescription medications. ?   Hydromorphone, dihydrocodeine and norhydrocodone are expected ?   metabolites of hydrocodone. Hydromorphone and dihydrocodeine are ?   also available as scheduled prescription medications. ? ?  Phenobarbital                  PRESENT      EXPECTED ?   Phenobarbital is an expected metabolite of primidone; Phenobarbital ?   may also be administered as a prescription drug. ? ?==================================================================== ?Test                      Result    Flag   Units      Ref Range ?  Creatinine              74               mg/dL      >=20 ?==================================================================== ?Declared Medications: ? The flagging and interpretation on this report are based on the ? following declared medications.  Unexpected results may arise from ? inaccuracies in the declared medications. ? ? **Note: The testing scope of this panel includes these medications: ? ? Hydrocodone (Norco) ? Primidone (Mysoline) ? ? **Note: The testing scope of this panel does not include the ? following reported medications: ? ? Acetaminophen (Norco) ? Aspirin ? Atropine  (Lomotil) ? Budesonide (Entocort) ? Bupropion (Wellbutrin) ? Diphenoxylate (Lomotil) ? Divaleproex (Depakote) ? Donepezil (Aricept) ? Empagliflozin (Jardiance) ? Fenofibrate (TriCor) ? Finasteride (Proscar) ? Fish Oil ? Gabapentin (Neurontin) ? Hydrochlorothiazide ? Hyoscyamine ? Iron ? Lisinopril ? Memantine (Namenda) ? Metoprolol (Toprol) ? Mirabegron (Myrbetriq) ? Multivitamin ? Naloxegol (Movantik) ? Omeprazole (Prilosec) ? Quetiapine (Seroquel) ? Semaglutide (Ozempic) ? Tamsulosin (Flomax) ? Trazodone (Desyrel) ?==================================================================== ?For clinical consultation, please call [phone]. ?==================================================================== ?  ?  ? ?ROS  ?Constitutional: Denies any fever or chills ?Gastrointestinal: No reported hemesis, hematochezia, vomiting, or acute GI distress ?Musculoskeletal: Low back pain, left ankle pain ?Neurological: No  reported episodes of acute onset apraxia, aphasia, dysarthria, agnosia, amnesia, paralysis, loss of coordination, or loss of consciousness ? ?Medication Review  ?FreeStyle Libre 14 Day Sensor, HYDROcodone-acetaminophen, HYDROmorphone, Multiple Vitamins-Minerals, QUEtiapine, Semaglutide (1 MG/DOSE), aspirin EC, buPROPion, budesonide, dapagliflozin propanediol, divalproex, donepezil, doxycycline, fenofibrate, ferrous sulfate, gabapentin, lisinopril-hydrochlorothiazide, memantine, metFORMIN, metoprolol succinate, omega-3 acid ethyl esters, omeprazole, primidone, promethazine-dextromethorphan, rosuvastatin, tamsulosin, varenicline, and vitamin C ? ?History Review  ?Allergy: Mr. Simones has No Known Allergies. ?Drug: Mr. Earwood  reports no history of drug use. ?Alcohol:  reports that he does not currently use alcohol. ?Tobacco:  reports that he has been smoking cigarettes. He has a 30.00 pack-year smoking history. He has never used smokeless tobacco. ?Social: Mr. Bjorkman  reports that he has been smoking  cigarettes. He has a 30.00 pack-year smoking history. He has never used smokeless tobacco. He reports that he does not currently use alcohol. He reports that he does not use drugs. ?Medical:  has a past medical hist

## 2021-10-02 DIAGNOSIS — F0390 Unspecified dementia without behavioral disturbance: Secondary | ICD-10-CM | POA: Diagnosis not present

## 2021-10-02 DIAGNOSIS — F17200 Nicotine dependence, unspecified, uncomplicated: Secondary | ICD-10-CM | POA: Diagnosis not present

## 2021-10-02 DIAGNOSIS — E1142 Type 2 diabetes mellitus with diabetic polyneuropathy: Secondary | ICD-10-CM | POA: Diagnosis not present

## 2021-10-02 DIAGNOSIS — E1151 Type 2 diabetes mellitus with diabetic peripheral angiopathy without gangrene: Secondary | ICD-10-CM | POA: Diagnosis not present

## 2021-10-02 DIAGNOSIS — M461 Sacroiliitis, not elsewhere classified: Secondary | ICD-10-CM | POA: Diagnosis not present

## 2021-10-02 DIAGNOSIS — I70203 Unspecified atherosclerosis of native arteries of extremities, bilateral legs: Secondary | ICD-10-CM | POA: Diagnosis not present

## 2021-10-02 DIAGNOSIS — J449 Chronic obstructive pulmonary disease, unspecified: Secondary | ICD-10-CM | POA: Diagnosis not present

## 2021-10-02 DIAGNOSIS — I130 Hypertensive heart and chronic kidney disease with heart failure and stage 1 through stage 4 chronic kidney disease, or unspecified chronic kidney disease: Secondary | ICD-10-CM | POA: Diagnosis not present

## 2021-10-02 DIAGNOSIS — D692 Other nonthrombocytopenic purpura: Secondary | ICD-10-CM | POA: Diagnosis not present

## 2021-10-02 DIAGNOSIS — E1122 Type 2 diabetes mellitus with diabetic chronic kidney disease: Secondary | ICD-10-CM | POA: Diagnosis not present

## 2021-10-02 DIAGNOSIS — F1120 Opioid dependence, uncomplicated: Secondary | ICD-10-CM | POA: Diagnosis not present

## 2021-10-02 DIAGNOSIS — F319 Bipolar disorder, unspecified: Secondary | ICD-10-CM | POA: Diagnosis not present

## 2021-10-04 ENCOUNTER — Ambulatory Visit: Payer: PPO

## 2021-10-12 ENCOUNTER — Ambulatory Visit (INDEPENDENT_AMBULATORY_CARE_PROVIDER_SITE_OTHER): Payer: PPO | Admitting: Licensed Clinical Social Worker

## 2021-10-12 DIAGNOSIS — F3342 Major depressive disorder, recurrent, in full remission: Secondary | ICD-10-CM | POA: Diagnosis not present

## 2021-10-12 NOTE — Progress Notes (Signed)
   THERAPIST PROGRESS NOTE  Session Time: 8-9am  [facility] in office visit for patient and LCSW clinician  Participation Level: Active  Behavioral Response: Neat and Well GroomedAlertAnxious  Type of Therapy: Individual Therapy  Treatment Goals addressed:  Problem: Decrease depressive symptoms and improve levels of effective functioning Goal: LTG: Reduce frequency, intensity, and duration of depression symptoms as evidenced by: pt self report Outcome: Progressing Goal: STG: '@PREFFIRSTNAME'$ @ will participate in at least 80% of scheduled individual psychotherapy sessions Outcome: Progressing Intervention: Encourage verbalization of feelings/concerns/expectations Intervention: REVIEW PLEASE SKILLS (TREAT PHYSICAL ILLNESS, BALANCE EATING, AVOID MOOD-ALTERING SUBSTANCES, BALANCE SLEEP AND GET EXERCISE) WITH [name] "[name]" Intervention: Assist with coping skills and behavior   ProgressTowards Goals: Progressing  Interventions: CBT (see above)  Summary: WHEELER INCORVAIA is a 81 y.o. male who presents with improving symptoms related to his depression diagnosis. Pt reports overall mood is stable and that he is managing situational stressors better.   Allowed pt to explore and express thoughts and feelings associated with recent life situations and external stressors. Discussed relationship with grandson and pts increasing thoughts about wife's bible study: [employer]. Discussed pts grandson with complex medical/kidney concerns. Allowed pt to label and express feelings.   Continued recommendations are as follows: self care behaviors, positive social engagements, focusing on overall work/home/life balance, and focusing on positive physical and emotional wellness.    Suicidal/Homicidal: No  Therapist Response: Pt is continuing to apply interventions learned in session into daily life situations. Pt is currently on track to meet goals utilizing interventions mentioned above. Personal  growth and progress noted. Treatment to continue as indicated.   Plan: Return again in 4 weeks.  Diagnosis: MDD (major depressive disorder), recurrent, in full remission ([place])  Collaboration of Care: Other Pt encouraged to continue psychiatric care with Dr. Modesta Messing  Patient/Guardian was advised Release of Information must be obtained prior to any record release in order to collaborate their care with an outside provider. Patient/Guardian was advised if they have not already done so to contact the registration department to sign all necessary forms in order for [place] to release information regarding their care.   Consent: Patient/Guardian gives verbal consent for treatment and assignment of benefits for services provided during this visit. Patient/Guardian expressed understanding and agreed to proceed.   [place], LCSW 10/12/2021

## 2021-10-18 ENCOUNTER — Other Ambulatory Visit: Payer: Self-pay | Admitting: Psychiatry

## 2021-10-18 DIAGNOSIS — B372 Candidiasis of skin and nail: Secondary | ICD-10-CM | POA: Diagnosis not present

## 2021-10-18 DIAGNOSIS — L821 Other seborrheic keratosis: Secondary | ICD-10-CM | POA: Diagnosis not present

## 2021-10-18 DIAGNOSIS — L570 Actinic keratosis: Secondary | ICD-10-CM | POA: Diagnosis not present

## 2021-10-18 DIAGNOSIS — D225 Melanocytic nevi of trunk: Secondary | ICD-10-CM | POA: Diagnosis not present

## 2021-10-18 DIAGNOSIS — D2262 Melanocytic nevi of left upper limb, including shoulder: Secondary | ICD-10-CM | POA: Diagnosis not present

## 2021-10-18 DIAGNOSIS — Z85828 Personal history of other malignant neoplasm of skin: Secondary | ICD-10-CM | POA: Diagnosis not present

## 2021-10-19 ENCOUNTER — Telehealth: Payer: Self-pay

## 2021-10-19 ENCOUNTER — Telehealth: Payer: Self-pay | Admitting: Student in an Organized Health Care Education/Training Program

## 2021-10-19 NOTE — Telephone Encounter (Signed)
Called and spoke with pharm about canceling remaining Rx for Hydromorphone

## 2021-10-19 NOTE — Telephone Encounter (Signed)
Called patient and he states that Hydromorphone is making him sick and he stopped taking. States that he can not take anything with Morphine in it and feels like Oxy '10mg'$  helped him the best. What are your thoughts? I will be glad to call him back.

## 2021-10-19 NOTE — Telephone Encounter (Signed)
Clarify what his oxycodone dose was 10 mg every 6 hours?  We'll need to cancel any remaining HM Rx's

## 2021-10-19 NOTE — Telephone Encounter (Signed)
Called James Hood and he was taking Oxy '10mg'$  QID. He wanted to tell you thank you for doing this.

## 2021-10-19 NOTE — Telephone Encounter (Signed)
Pt stated that Hydromorphone is driving him crazy. Also , Pt said it makes him nauseous and he vomits. Pt stopped taking Hydromorphone. Pt is in a lot of pain and would like something else. Please call Pt with an update.

## 2021-10-20 ENCOUNTER — Other Ambulatory Visit: Payer: Self-pay | Admitting: Student in an Organized Health Care Education/Training Program

## 2021-10-20 ENCOUNTER — Telehealth: Payer: Self-pay | Admitting: Student in an Organized Health Care Education/Training Program

## 2021-10-20 ENCOUNTER — Other Ambulatory Visit: Payer: Self-pay

## 2021-10-20 DIAGNOSIS — F1190 Opioid use, unspecified, uncomplicated: Secondary | ICD-10-CM

## 2021-10-20 DIAGNOSIS — G894 Chronic pain syndrome: Secondary | ICD-10-CM

## 2021-10-20 MED ORDER — OXYCODONE-ACETAMINOPHEN 10-325 MG PO TABS: 1.0000 | ORAL_TABLET | Freq: Four times a day (QID) | ORAL | 0 refills | Status: DC | PRN

## 2021-10-20 NOTE — Telephone Encounter (Signed)
Pateint states [facility] says they did not get script for Oxycodone '10mg'$ ? Please let patient know status

## 2021-10-20 NOTE — Telephone Encounter (Signed)
Dr Holley Raring, patient was taking oxycodone 10 mg Qid.  He is calling because it was not sent ini yesterday.  [facility].  It is not there to send a refill request.

## 2021-10-25 ENCOUNTER — Other Ambulatory Visit: Payer: Self-pay | Admitting: Psychiatry

## 2021-11-02 ENCOUNTER — Other Ambulatory Visit: Payer: Self-pay

## 2021-11-02 DIAGNOSIS — K529 Noninfective gastroenteritis and colitis, unspecified: Secondary | ICD-10-CM

## 2021-11-02 MED ORDER — BUDESONIDE 3 MG PO CPEP: 6.0000 mg | ORAL_CAPSULE | Freq: Every day | ORAL | 0 refills | Status: DC

## 2021-11-10 ENCOUNTER — Ambulatory Visit (INDEPENDENT_AMBULATORY_CARE_PROVIDER_SITE_OTHER): Payer: PPO

## 2021-11-10 ENCOUNTER — Encounter: Payer: Self-pay | Admitting: Family Medicine

## 2021-11-10 ENCOUNTER — Ambulatory Visit (INDEPENDENT_AMBULATORY_CARE_PROVIDER_SITE_OTHER): Payer: PPO | Admitting: Family Medicine

## 2021-11-10 VITALS — BP 125/70 | HR 93 | Temp 97.8°F | Ht 70.0 in | Wt 166.6 lb

## 2021-11-10 DIAGNOSIS — G2581 Restless legs syndrome: Secondary | ICD-10-CM | POA: Diagnosis not present

## 2021-11-10 DIAGNOSIS — R053 Chronic cough: Secondary | ICD-10-CM | POA: Insufficient documentation

## 2021-11-10 DIAGNOSIS — I10 Essential (primary) hypertension: Secondary | ICD-10-CM

## 2021-11-10 DIAGNOSIS — E559 Vitamin D deficiency, unspecified: Secondary | ICD-10-CM | POA: Diagnosis not present

## 2021-11-10 DIAGNOSIS — Z720 Tobacco use: Secondary | ICD-10-CM | POA: Diagnosis not present

## 2021-11-10 DIAGNOSIS — E1142 Type 2 diabetes mellitus with diabetic polyneuropathy: Secondary | ICD-10-CM | POA: Diagnosis not present

## 2021-11-10 DIAGNOSIS — R059 Cough, unspecified: Secondary | ICD-10-CM | POA: Insufficient documentation

## 2021-11-10 LAB — VITAMIN D 25 HYDROXY (VIT D DEFICIENCY, FRACTURES): VITD: 37.09 ng/mL (ref 30.00–100.00)

## 2021-11-10 LAB — HEMOGLOBIN A1C: Hgb A1c MFr Bld: 7.2 % — ABNORMAL HIGH (ref 4.6–6.5)

## 2021-11-10 MED ORDER — PRAMIPEXOLE DIHYDROCHLORIDE 0.125 MG PO TABS: 0.1250 mg | ORAL_TABLET | Freq: Every day | ORAL | 2 refills | Status: DC

## 2021-11-10 NOTE — Assessment & Plan Note (Signed)
I encouraged smoking cessation.  Patient is not ready to quit.

## 2021-11-14 ENCOUNTER — Other Ambulatory Visit: Payer: Self-pay | Admitting: Family Medicine

## 2021-11-14 DIAGNOSIS — E782 Mixed hyperlipidemia: Secondary | ICD-10-CM

## 2021-11-14 DIAGNOSIS — N400 Enlarged prostate without lower urinary tract symptoms: Secondary | ICD-10-CM

## 2021-11-20 ENCOUNTER — Other Ambulatory Visit: Payer: Self-pay | Admitting: Pain Medicine

## 2021-11-20 ENCOUNTER — Other Ambulatory Visit: Payer: Self-pay | Admitting: *Deleted

## 2021-11-20 ENCOUNTER — Telehealth: Payer: Self-pay | Admitting: Student in an Organized Health Care Education/Training Program

## 2021-11-20 ENCOUNTER — Telehealth: Payer: Self-pay | Admitting: *Deleted

## 2021-11-20 MED ORDER — OXYCODONE-ACETAMINOPHEN 10-325 MG PO TABS: 1.0000 | ORAL_TABLET | Freq: Four times a day (QID) | ORAL | 0 refills | Status: DC | PRN

## 2021-11-20 NOTE — Telephone Encounter (Signed)
Message sent to [facility]

## 2021-11-20 NOTE — Telephone Encounter (Signed)
Please check patients Oxycodone script, he says he didn't get one to fill for July? His med mgmt appt is 7-11-. Please call patient

## 2021-11-20 NOTE — Progress Notes (Signed)
[place] MD/PA/NP OP Progress Note  11/27/2021 8:35 AM James Hood  MRN:  [id number]  Chief Complaint:  Chief Complaint  Patient presents with   Follow-up   Depression   HPI:  He was started on pramipexole by his PCP.  He states that he has been doing well.  His wife told him that she would leave if he were to be continue to be critical to others.  He talks about an episode of him talking about his stepdaughter, who is involved in the church.  He has a sponsor, and has been working on "defective characters" of being arrogant and critical.  He agrees that this is a big step for him to have awareness in this.  He denies feeling depressed or anhedonia.  He sleeps significantly better since taking pramipexole; it worked significantly for restless leg.  He feels wary of discussing with his PCP about tapering down gabapentin as he thinks he is doing well with the current medication regimen.  He eats well.  He tripped and fell the other day.  He denies any dizziness.  He sits outside with his wife, although he is unable to take a walk as it causes him ankle pain.  He denies SI.  He continues to go to [employer]. He feels comfortable to stay on the current medication regimen.   Visit Diagnosis:    ICD-10-CM   1. Recurrent major depressive disorder, in partial remission ([place])  F33.41     2. Insomnia, unspecified type  G47.00       Past Psychiatric History: Please see initial evaluation for full details. I have reviewed the history. No updates at this time.     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   Colon polyps    Dementia ([place])    Depression    Diabetes mellitus type 2 in nonobese (HCC)    GERD (gastroesophageal reflux disease)    BARRETTS ESOPHAGUS RESOLVED PER PATIENT   Headache    History of alcoholism  ([place])    History of hiatal hernia    Hypercholesteremia    Hypertension    Hyperthyroidism    Neuropathic pain    Primary localized osteoarthritis of right knee    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   Stomach ulcer    Tremor, essential    Wound infection complicating hardware ([place]) 04/02/2018    Past Surgical History:  Procedure Laterality Date   ANKLE ARTHROSCOPY Left 09/29/2019   Procedure: LEFT ANKLE ARTHROSCOPY, DEBRIDEMENT;  Surgeon: Newt Minion, MD;  Location: [facility];  Service: Orthopedics;  Laterality: Left;   BACK SURGERY     COLONOSCOPY WITH PROPOFOL N/A 09/14/2016   Procedure: COLONOSCOPY WITH PROPOFOL;  Surgeon: Manya Silvas, MD;  Location: [facility] ENDOSCOPY;  Service: Endoscopy;  Laterality: N/A;   esophageal stretch     ESOPHAGOGASTRODUODENOSCOPY (EGD) WITH PROPOFOL N/A 12/13/2020   Procedure: ESOPHAGOGASTRODUODENOSCOPY (EGD) WITH PROPOFOL;  Surgeon: Lesly Rubenstein, MD;  Location: [facility] ENDOSCOPY;  Service: Endoscopy;  Laterality: N/A;  IDDM   JOINT REPLACEMENT Right 2016   knee   LUMBAR LAMINECTOMY/DECOMPRESSION MICRODISCECTOMY Left 02/03/2016   Procedure: LEFT L5-S1 DISKECTOMY;  Surgeon: Leeroy Cha, MD;  Location: [facility] NEURO ORS;  Service: Neurosurgery;  Laterality: Left;  LEFT L5-S1 DISKECTOMY  PULSE GENERATOR IMPLANT N/A 08/21/2017   Procedure: UNILATERAL PULSE GENERATOR IMPLANT;  Surgeon: Meade Maw, MD;  Location: [facility] ORS;  Service: Neurosurgery;  Laterality: N/A;   PULSE GENERATOR IMPLANT Right 04/02/2018   Procedure: REMOVAL OF PULSE GENERATOR IMPLANT AND LEADS;  Surgeon: Meade Maw, MD;  Location: [facility] ORS;  Service: Neurosurgery;  Laterality: Right;   TONSILLECTOMY     TOTAL KNEE ARTHROPLASTY Right 11/15/2014   Procedure: TOTAL KNEE ARTHROPLASTY;  Surgeon: Elsie Saas, MD;  Location: [facility];  Service: Orthopedics;  Laterality: Right;    Family Psychiatric History: Please see initial evaluation for full  details. I have reviewed the history. No updates at this time.     Family History:  Family History  Problem Relation Age of Onset   Heart attack Mother    Heart disease Mother    Hypertension Father    Depression Father    Kidney cancer Neg Hx    Prostate cancer Neg Hx     Social History:  Social History   Socioeconomic History   Marital status: Married    Spouse name: Not on file   Number of children: Not on file   Years of education: Not on file   Highest education level: Not on file  Occupational History   Not on file  Tobacco Use   Smoking status: Every Day    Packs/day: 0.50    Years: 60.00    Total pack years: 30.00    Types: Cigarettes   Smokeless tobacco: Never  Vaping Use   Vaping Use: Former  Substance and Sexual Activity   Alcohol use: Not Currently    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   Drug use: Never   Sexual activity: Not Currently  Other Topics Concern   Not on file  Social History Narrative   Not on file   Social Determinants of Health   Financial Resource Strain: Medium Risk (07/04/2021)   Overall Financial Resource Strain (CARDIA)    Difficulty of Paying Living Expenses: Somewhat hard  Food Insecurity: No Food Insecurity (11/15/2017)   Hunger Vital Sign    Worried About Running Out of Food in the Last Year: Never true    Ran Out of Food in the Last Year: Never true  Transportation Needs: No Transportation Needs (11/15/2017)   PRAPARE - Hydrologist (Medical): No    Lack of Transportation (Non-Medical): No  Physical Activity: Insufficiently Active (12/30/2019)   Exercise Vital Sign    Days of Exercise per Week: 3 days    Minutes of Exercise per Session: 40 min  Stress: No Stress Concern Present (11/15/2017)   [place]    Feeling of Stress : Not at all  Social Connections: Unknown (04/02/2018)   Social Connection and Isolation Panel  [NHANES]    Frequency of Communication with Friends and Family: Patient refused    Frequency of Social Gatherings with Friends and Family: Patient refused    Attends Religious Services: Patient refused    Active Member of Clubs or Organizations: Patient refused    Attends Archivist Meetings: Patient refused    Marital Status: Patient refused    Allergies: No Known Allergies  Metabolic Disorder Labs: Lab Results  Component Value Date   HGBA1C 7.2 (H) 11/10/2021   MPG 131 01/25/2016   MPG 166 11/05/2014   No results found for: "PROLACTIN" Lab Results  Component Value Date   CHOL 186  08/02/2021   TRIG 304.0 (H) 08/02/2021   HDL 59.10 08/02/2021   CHOLHDL 3 08/02/2021   VLDL 60.8 (H) 08/02/2021   LDLCALC 57 09/25/2017   Lab Results  Component Value Date   TSH 1.08 08/02/2021   TSH 0.97 10/11/2020    Therapeutic Level Labs: No results found for: "LITHIUM" No results found for: "VALPROATE" No results found for: "CBMZ"  Current Medications: Current Outpatient Medications  Medication Sig Dispense Refill   aspirin EC 81 MG tablet Take 81 mg by mouth daily.     budesonide (ENTOCORT EC) 3 MG 24 hr capsule Take 2 capsules (6 mg total) by mouth daily. 180 capsule 0   cholestyramine (QUESTRAN) 4 g packet Take 1 packet by mouth 2 (two) times daily.     Continuous Blood Gluc Sensor (FREESTYLE LIBRE 14 DAY SENSOR) MISC APPLY 1 SENSOR EVERY 14 DAYS TO MONITOR BLOOD GLUCOSE LEVELS. REMOVE OLD SENSOR BEFORE APPLYING NEW SENSOR 2 each 5   dapagliflozin propanediol (FARXIGA) 10 MG TABS tablet Take 1 tablet (10 mg total) by mouth daily. 90 tablet 3   diphenoxylate-atropine (LOMOTIL) 2.5-0.025 MG tablet Take by mouth.     divalproex (DEPAKOTE ER) 500 MG 24 hr tablet Take 1 tablet by mouth 2 (two) times daily.     donepezil (ARICEPT) 10 MG tablet Take 10 mg by mouth at bedtime.     doxycycline (VIBRA-TABS) 100 MG tablet Take 1 tablet (100 mg total) by mouth 2 (two) times daily. 20  tablet 0   fenofibrate (TRICOR) 145 MG tablet TAKE ONE TABLET BY MOUTH DAILY 90 tablet 1   ferrous sulfate 325 (65 FE) MG EC tablet Take 325 mg by mouth daily.     gabapentin (NEURONTIN) 600 MG tablet TAKE TWO TABLETS BY MOUTH THREE TIMES A DAY 180 tablet 1   gabapentin (NEURONTIN) 600 MG tablet Take by mouth.     HYDROmorphone (DILAUDID) 4 MG tablet Take 1 tablet (4 mg total) by mouth every 8 (eight) hours as needed for severe pain. Must last 30 days. 90 tablet 0   lisinopril-hydrochlorothiazide (ZESTORETIC) 20-25 MG tablet TAKE ONE TABLET BY MOUTH TWICE A DAY 180 tablet 1   memantine (NAMENDA) 10 MG tablet Take 1 tablet by mouth daily.     metFORMIN (GLUCOPHAGE-XR) 500 MG 24 hr tablet Take 1 tablet (500 mg total) by mouth 2 (two) times daily. 180 tablet 1   metoprolol succinate (TOPROL-XL) 25 MG 24 hr tablet Take 25 mg by mouth daily.     Multiple Vitamins-Minerals (CENTRUM SILVER PO) Take 1 tablet by mouth daily.     omega-3 acid ethyl esters (LOVAZA) 1 g capsule TAKE TWO CAPSULES BY MOUTH TWICE A DAY 360 capsule 3   omeprazole (PRILOSEC) 20 MG capsule Take 20 mg by mouth daily.     oxyCODONE-acetaminophen (PERCOCET) 10-325 MG tablet Take 1 tablet by mouth every 6 (six) hours as needed for pain. Must last 30 days. 120 tablet 0   pramipexole (MIRAPEX) 0.125 MG tablet Take 1 tablet (0.125 mg total) by mouth daily. Take 2-3 hours prior to bedtime. 30 tablet 2   primidone (MYSOLINE) 50 MG tablet Take 150-250 mg by mouth 2 (two) times daily. Take 250 mg by mouth in the morning & take 150 mg at night.     promethazine-dextromethorphan (PROMETHAZINE-DM) 6.25-15 MG/5ML syrup Take 5 mLs by mouth 4 (four) times daily as needed for cough. 118 mL 0   QUEtiapine (SEROQUEL) 100 MG tablet Take 125 mg by mouth  at bedtime.     rosuvastatin (CRESTOR) 40 MG tablet Take 1 tablet (40 mg total) by mouth daily. 90 tablet 3   Semaglutide, 1 MG/DOSE, (OZEMPIC, 1 MG/DOSE,) 4 MG/3ML SOPN Inject 1 mg into the skin once a  week. 3 mL 1   tamsulosin (FLOMAX) 0.4 MG CAPS capsule TAKE TWO CAPSULES BY MOUTH DAILY AFTER SUPPER 180 capsule 0   varenicline (CHANTIX) 1 MG tablet Take 1/2 tablet by mouth daily for 3 days, then 1/2 tablet by mouth twice daily for 4 days, then 1 tablet twice daily thereafter. Take with food. 60 tablet 5   vitamin C (ASCORBIC ACID) 500 MG tablet Take 500 mg by mouth daily.     buPROPion (WELLBUTRIN XL) 300 MG 24 hr tablet Take 1 tablet (300 mg total) by mouth daily. 90 tablet 0   [START ON 01/24/2022] traZODone (DESYREL) 100 MG tablet Take 2 tablets (200 mg total) by mouth at bedtime as needed for sleep. 180 tablet 0   No current facility-administered medications for this visit.     Musculoskeletal: Strength & Muscle Tone: within normal limits Gait & Station: normal- uses a cane Patient leans: N/A  Psychiatric Specialty Exam: Review of Systems  Psychiatric/Behavioral:  Negative for agitation, behavioral problems, confusion, decreased concentration, dysphoric mood, hallucinations, self-injury, sleep disturbance and suicidal ideas. The patient is not nervous/anxious and is not hyperactive.   All other systems reviewed and are negative.   Blood pressure 101/63, pulse 94, temperature 97.7 F (36.5 C), temperature source Oral, weight 168 lb 9.6 oz (76.5 kg).Body mass index is 24.19 kg/m.  General Appearance: Fairly Groomed  Eye Contact:  Good  Speech:  Clear and Coherent  Volume:  Normal  Mood:   good  Affect:  Appropriate, Congruent, and euthymic  Thought Process:  Coherent  Orientation:  Full (Time, Place, and Person)  Thought Content: Logical   Suicidal Thoughts:  No  Homicidal Thoughts:  No  Memory:  Immediate;   Good  Judgement:  Good  Insight:  Good  Psychomotor Activity:  Normal no rigidity, no tremors  Concentration:  Concentration: Good and Attention Span: Good  Recall:  Good  Fund of Knowledge: Good  Language: Good  Akathisia:  No  Handed:  Right  AIMS (if  indicated): not done  Assets:  Communication Skills Desire for Improvement  ADL's:  Intact  Cognition: WNL  Sleep:  Good   Screenings: GAD-7    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 in [place] from 12/07/2020 in [place] from 11/02/2020 in [place] from 09/07/2020 in [place] from 09/09/2015 in [facility]  Total GAD-7 Score 0 '3 6 5 6      '$ [place] from 11/15/2017 in [place] from 11/14/2016 in [facility]  Total Score (max 30 points ) 30 30      PHQ2-9    [place] Visit from 11/27/2021 in [place] Office Visit from 11/10/2021 in [place] Office Visit from 09/25/2021 in [place] Visit from 07/31/2021 in [place] Office Visit from 06/29/2021 in [place]  PHQ-2 Total Score 1 0 0 0 0  PHQ-9 Total Score 4 -- -- -- --      [place] Office Visit from 09/25/2021 in [facility] Office Visit from 05/01/2021 in [place] Office Visit from 01/30/2021 in [place] No Risk No Risk No Risk        Assessment and Plan:  GUSTABO GORDILLO is a 81 y.o. year old male with a history of  pseudo dementia, sleep disorder, tremor, history of QTc prolongation, who presents for follow up appointment for below.    1. Recurrent major depressive disorder, in partial remission ([place]) He denies any significant mood symptoms since the last visit.  Psychosocial stressors includes current politics, conflict with his stepson, his daughter, and  demoralization due to aging/pain.  He continues to actively involve in [place], and stays connected with his family including his wife.  Will continue bupropion to target depression.    2. Insomnia, unspecified type Improving.  Will continue current dose of trazodone as needed for insomnia.  Noted that he is also on quetiapine, prescribed by his neurologist; he had a worsening in insomnia when he tried to taper down this medication.  Discussed potential risk of fall with concomitant use of trazodone, quetiapine and gabapentin.    # History of cognitive impairment Followed-up by neurologist, and has been on memantine and donepezil.      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 Acute risk factors for suicide include: N/A. Protective factors for this patient include: positive social support, responsibility to others (children, family), coping skills and hope for the future. Considering these factors, the overall suicide risk at this point appears to be low. Patient is appropriate for outpatient follow up.     The next visit will be in person visit. Please arrive 15 mins before the scheduled time.   [facility]  Address: [address], [place], [place] [address]      Collaboration of Care: Collaboration of Care: Other N/A  Patient/Guardian was advised Release of Information must be obtained prior to any record release in order to collaborate their care with an  outside provider. Patient/Guardian was advised if they have not already done so to contact the registration department to sign all necessary forms in order for [place] to release information regarding their care.   Consent: Patient/Guardian gives verbal consent for treatment and assignment of benefits for services provided during this visit. Patient/Guardian expressed understanding and agreed to proceed.    Norman Clay, MD 11/27/2021, 8:35 AM

## 2021-11-23 ENCOUNTER — Ambulatory Visit: Payer: Self-pay | Admitting: Urology

## 2021-11-27 ENCOUNTER — Encounter: Payer: Self-pay | Admitting: Psychiatry

## 2021-11-27 ENCOUNTER — Ambulatory Visit (INDEPENDENT_AMBULATORY_CARE_PROVIDER_SITE_OTHER): Payer: PPO | Admitting: Psychiatry

## 2021-11-27 VITALS — BP 101/63 | HR 94 | Temp 97.7°F | Wt 168.6 lb

## 2021-11-27 DIAGNOSIS — G4700 Insomnia, unspecified: Secondary | ICD-10-CM

## 2021-11-27 DIAGNOSIS — F3341 Major depressive disorder, recurrent, in partial remission: Secondary | ICD-10-CM

## 2021-11-27 MED ORDER — TRAZODONE HCL 100 MG PO TABS: 200.0000 mg | ORAL_TABLET | Freq: Every evening | ORAL | 0 refills | Status: DC | PRN

## 2021-11-27 MED ORDER — BUPROPION HCL ER (XL) 300 MG PO TB24: 300.0000 mg | ORAL_TABLET | Freq: Every day | ORAL | 0 refills | Status: DC

## 2021-11-27 NOTE — Patient Instructions (Signed)
Continue bupropion XR 300 mg daily  Continue Trazodone 200 mg at night as needed for insomnia  Next appointment: 10/2 at 8 AM, in person

## 2021-11-28 ENCOUNTER — Encounter: Payer: Self-pay | Admitting: Student in an Organized Health Care Education/Training Program

## 2021-11-28 ENCOUNTER — Ambulatory Visit
Payer: PPO | Attending: Student in an Organized Health Care Education/Training Program | Admitting: Student in an Organized Health Care Education/Training Program

## 2021-11-28 VITALS — BP 130/75 | HR 82 | Temp 97.3°F | Resp 16 | Ht 70.0 in | Wt 165.0 lb

## 2021-11-28 DIAGNOSIS — M25552 Pain in left hip: Secondary | ICD-10-CM | POA: Insufficient documentation

## 2021-11-28 DIAGNOSIS — G8929 Other chronic pain: Secondary | ICD-10-CM | POA: Diagnosis not present

## 2021-11-28 DIAGNOSIS — M25572 Pain in left ankle and joints of left foot: Secondary | ICD-10-CM | POA: Insufficient documentation

## 2021-11-28 DIAGNOSIS — F1190 Opioid use, unspecified, uncomplicated: Secondary | ICD-10-CM | POA: Diagnosis not present

## 2021-11-28 DIAGNOSIS — G894 Chronic pain syndrome: Secondary | ICD-10-CM

## 2021-11-28 DIAGNOSIS — M19072 Primary osteoarthritis, left ankle and foot: Secondary | ICD-10-CM | POA: Diagnosis not present

## 2021-11-28 DIAGNOSIS — M47816 Spondylosis without myelopathy or radiculopathy, lumbar region: Secondary | ICD-10-CM

## 2021-11-28 MED ORDER — OXYCODONE-ACETAMINOPHEN 10-325 MG PO TABS: 1.0000 | ORAL_TABLET | Freq: Four times a day (QID) | ORAL | 0 refills | Status: AC | PRN

## 2021-11-28 MED ORDER — OXYCODONE-ACETAMINOPHEN 10-325 MG PO TABS: 1.0000 | ORAL_TABLET | Freq: Four times a day (QID) | ORAL | 0 refills | Status: DC | PRN

## 2021-11-28 NOTE — Patient Instructions (Addendum)
Please call Dr Zigmund Daniel clinic to discuss eval for left ankle PRP  If they need referral from Dr Holley Raring, have their office please send fax  You will have xrays completed prior to next appt. With pain clinic

## 2021-11-28 NOTE — Progress Notes (Signed)
PROVIDER NOTE: Information contained herein reflects review and annotations entered in association with encounter. Interpretation of such information and data should be left to medically-trained personnel. Information provided to patient can be located elsewhere in the medical record under "Patient Instructions". Document created using STT-dictation technology, any transcriptional errors that may result from process are unintentional.    Patient: James Hood  Service Category: E/M  Provider: [name], MD  DOB: 1940/08/22  DOS: 11/28/2021  Specialty: Interventional Pain Management  MRN: [id number]  Setting: Ambulatory outpatient  PCP: James Haven, MD  Type: Established Patient    Referring Provider: Leone Haven, MD  Location: Office  Delivery: Face-to-face     HPI  [name], a 81 y.o. 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 [name]'s primary complain today is Ankle Pain (left)  Last encounter: My last encounter with him was on 09/26/21  Pertinent problems: [name] has Primary localized osteoarthritis of right knee; DJD (degenerative joint disease) of knee; Lumbar herniated disc; Chronic pain syndrome; Failed back surgical syndrome; Lumbosacral radiculopathy; Primary osteoarthritis of left ankle; Lumbar facet joint syndrome; Neuropathy; Chronic pain of left ankle; and Impingement of left ankle joint on their pertinent problem list. Pain Assessment: Severity of Chronic pain is reported as a 5 /10. Location: Ankle Left/sometimes up to left knee. Onset: More than a month ago. Quality: Aching. Timing: Constant. Modifying factor(s): meds. Vitals:  height is $RemoveB'5\' 10"'yRjRqUwX$  (1.778 m) and weight is 165 lb (74.8 kg). His temporal temperature is 97.3 F (36.3 C) (abnormal). His blood pressure is 130/75 and his pulse is 82. His respiration is 16 and oxygen saturation is 98%.   Reason for encounter: medication management.   James Hood  presents today for medication management and  severe left ankle pain related to severe ankle osteoarthritis status post 2 transverse tarsal joint injections with limited response.  We discussed possible PRP injections into the transverse tarsal joints with Dr. Zigmund Daniel.  I have provided a contact information for Dr Zigmund Daniel to patient.  At his last clinic visit, we exercised an opioid rotation from oxycodone to hydromorphone to see if that would provide better analgesic response and it did not.  It resulted in cognitive side effects and confusion.  We will return back to his previous dose of oxycodone, 10 mg every 6 hours as needed.  James Hood is also complaining of left hip pain that is worse with weightbearing as well as radiating pain along his left iliotibial band.  I will obtain a left hip x-ray and will discuss treatment plan with him thereafter.  We will also renew her annual urine toxicology screen  Pharmacotherapy Assessment  Analgesic:  Oxycodone 10 mg every 6 hours as needed, quantity 120/month; MME equals 60    Monitoring: [place] PMP: PDMP reviewed during this encounter.       Pharmacotherapy: No side-effects or adverse reactions reported. Compliance: No problems identified. Effectiveness: Clinically acceptable.  [name], RN  11/28/2021  9:15 AM  Sign when Signing Visit Nursing Pain Medication Assessment:  Safety precautions to be maintained throughout the outpatient stay will include: orient to surroundings, keep bed in low position, maintain call bell within reach at all times, provide assistance with transfer out of bed and ambulation.  Medication Inspection Compliance: Pill count conducted under aseptic conditions, in front of the patient. Neither the pills nor the bottle was removed from the patient's sight at any time. Once count was  completed pills were immediately returned to the patient in their original bottle.  Medication: Oxycodone/APAP Pill/Patch Count:  95 of 120 pills  remain Pill/Patch Appearance: Markings consistent with prescribed medication Bottle Appearance: Standard pharmacy container. Clearly labeled. Filled Date: 07 / 03 / 2023 Last Medication intake:  Today   UDS:  Summary  Date Value Ref Range Status  11/15/2020 Note  Final    Comment:    ==================================================================== [employer]ox[employer] Select 13 (MW) ==================================================================== Test                             Result       Flag       Units  Drug Present and Declared for Prescription Verification   Hydrocodone                    1623         EXPECTED   ng/mg creat   Hydromorphone                  226          EXPECTED   ng/mg creat   Dihydrocodeine                 105          EXPECTED   ng/mg creat   Norhydrocodone                 3285         EXPECTED   ng/mg creat    Sources of hydrocodone include scheduled prescription medications.    Hydromorphone, dihydrocodeine and norhydrocodone are expected    metabolites of hydrocodone. Hydromorphone and dihydrocodeine are    also available as scheduled prescription medications.    Phenobarbital                  PRESENT      EXPECTED    Phenobarbital is an expected metabolite of primidone; Phenobarbital    may also be administered as a prescription drug.  ==================================================================== Test                      Result    Flag   Units      Ref Range   Creatinine              74               mg/dL      >=20 ==================================================================== Declared Medications:  The flagging and interpretation on this report are based on the  following declared medications.  Unexpected results may arise from  inaccuracies in the declared medications.   **Note: The testing scope of this panel includes these medications:   Hydrocodone (Norco)  Primidone (Mysoline)   **Note: The testing scope of this panel does  not include the  following reported medications:   Acetaminophen (Norco)  Aspirin  Atropine (Lomotil)  Budesonide (Entocort)  Bupropion (Wellbutrin)  Diphenoxylate (Lomotil)  Divaleproex (Depakote)  Donepezil (Aricept)  Empagliflozin (Jardiance)  Fenofibrate (TriCor)  Finasteride (Proscar)  Fish Oil  Gabapentin (Neurontin)  Hydrochlorothiazide  Hyoscyamine  Iron  Lisinopril  Memantine (Namenda)  Metoprolol (Toprol)  Mirabegron (Myrbetriq)  Multivitamin  Naloxegol (Movantik)  Omeprazole (Prilosec)  Quetiapine (Seroquel)  Semaglutide (Ozempic)  Tamsulosin (Flomax)  Trazodone (Desyrel) ==================================================================== For clinical consultation, please call [phone]. ====================================================================      ROS  Constitutional: Denies any fever or chills  Gastrointestinal: No reported hemesis, hematochezia, vomiting, or acute GI distress Musculoskeletal: Low back pain, left ankle pain Neurological: No reported episodes of acute onset apraxia, aphasia, dysarthria, agnosia, amnesia, paralysis, loss of coordination, or loss of consciousness  Medication Review  FreeStyle Libre 14 Day Sensor, HYDROmorphone, Multiple Vitamins-Minerals, QUEtiapine, Semaglutide (1 MG/DOSE), aspirin EC, buPROPion, budesonide, cholestyramine, dapagliflozin propanediol, diphenoxylate-atropine, divalproex, donepezil, doxycycline, fenofibrate, ferrous sulfate, gabapentin, lisinopril-hydrochlorothiazide, memantine, metFORMIN, metoprolol succinate, omega-3 acid ethyl esters, omeprazole, oxyCODONE-acetaminophen, pramipexole, primidone, promethazine-dextromethorphan, rosuvastatin, tamsulosin, traZODone, varenicline, and vitamin C  History Review  Allergy: Mr. Lindahl has No Known Allergies. Drug: Mr. Sanmiguel  reports no history of drug use. Alcohol:  reports that he does not currently use alcohol. Tobacco:  reports that he has  been smoking cigarettes. He has a 30.00 pack-year smoking history. He has never used smokeless tobacco. Social: Mr. Pettijohn  reports that he has been smoking cigarettes. He has a 30.00 pack-year smoking history. He has never used smokeless tobacco. He reports that he does not currently use alcohol. He reports that he does not use drugs. 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 Surgical: Mr. Angell  has a past surgical history that includes esophageal stretch; Tonsillectomy; Total knee arthroplasty (Right, 11/15/2014); Lumbar laminectomy/decompression microdiscectomy (Left, 02/03/2016); Colonoscopy with propofol (N/A, 09/14/2016); Joint replacement (Right, 2016); Pulse generator implant (N/A, 08/21/2017); Back surgery; Pulse generator implant (Right, 04/02/2018); Ankle arthroscopy (Left, 09/29/2019); and Esophagogastroduodenoscopy (egd) with propofol (N/A, 12/13/2020). Family: family history includes Depression in his father; Heart attack in his mother; Heart disease in his mother; Hypertension in his father.  Laboratory Chemistry Profile   Renal Lab Results  Component Value Date   BUN 28 (H) 08/02/2021   CREATININE 1.30 08/02/2021   GFR 51.52 (L) 08/02/2021   GFRAA >60 09/25/2019   GFRNONAA 55 (L) 06/08/2020    Hepatic Lab Results  Component Value Date   AST 21 09/19/2021   ALT 14 09/19/2021   ALBUMIN 4.3  09/19/2021   ALKPHOS 46 09/19/2021   AMMONIA 14 08/09/2016    Electrolytes Lab Results  Component Value Date   NA 135 08/02/2021   K 4.2 08/02/2021   CL 97 08/02/2021   CALCIUM 10.1 08/02/2021   MG 1.4 (L) 09/08/2020    Bone Lab Results  Component Value Date   VD25OH 37.09 11/10/2021    Inflammation (CRP: Acute Phase) (ESR: Chronic Phase) Lab Results  Component Value Date   ESRSEDRATE 15 07/20/2019   LATICACIDVEN 1.9 06/08/2020         Note: Above Lab results reviewed.   CLINICAL DATA:  Left sciatic pain for 2 years.  Prior back surgery.   EXAM: MRI LUMBAR SPINE WITHOUT CONTRAST   TECHNIQUE: Multiplanar, multisequence MR imaging of the lumbar spine was performed. No intravenous contrast was administered.   COMPARISON:  03/29/2016   FINDINGS: Segmentation:  Standard.   Alignment:  Physiologic.   Vertebrae:  No fracture, evidence of discitis, or bone lesion.   Conus medullaris and cauda equina: Conus extends to the T12-L1 level. Conus and cauda equina appear normal.   Paraspinal  and other soft tissues: No paraspinal abnormality.   Disc levels:   Disc spaces: Degenerative disc disease mild disc height loss throughout the lumbar spine.   T12-L1: No significant disc bulge. No evidence of neural foraminal stenosis. No central canal stenosis.   L1-L2: No significant disc bulge. No evidence of neural foraminal stenosis. No central canal stenosis.   L2-L3: No significant disc bulge. No evidence of neural foraminal stenosis. No central canal stenosis.   L3-L4: Mild broad-based disc bulge. Mild bilateral facet arthropathy. Moderate right foraminal stenosis. No central canal stenosis.   L4-L5: Mild broad-based disc bulge. Severe bilateral facet arthropathy. Bilateral lateral recess stenosis. Mild bilateral foraminal stenosis. No central canal stenosis.   L5-S1: Broad-based disc bulge with a small central disc protrusion. Moderate bilateral facet  arthropathy. Mild left foraminal stenosis. No central canal stenosis.   IMPRESSION: 1. At L3-4 there is a mild broad-based disc bulge. Mild bilateral facet arthropathy. Moderate right foraminal stenosis. 2. At L4-5 there is a mild broad-based disc bulge. Severe bilateral facet arthropathy. Bilateral lateral recess stenosis. Mild bilateral foraminal stenosis. 3. At L5-S1 there is a broad-based disc bulge with a small central disc protrusion. Moderate bilateral facet arthropathy. Mild left foraminal stenosis.     Electronically Signed   By: Kathreen Devoid   On: 04/19/2017 10:33  Physical Exam  General appearance: Well nourished, well developed, and well hydrated. In no apparent acute distress Mental status: Alert, oriented x 3 (person, place, & time)       Respiratory: No evidence of acute respiratory distress Eyes: PERLA Vitals: BP 130/75   Pulse 82   Temp (!) 97.3 F (36.3 C) (Temporal)   Resp 16   Ht $R'5\' 10"'Gu$  (1.778 m)   Wt 165 lb (74.8 kg)   SpO2 98%   BMI 23.68 kg/m  BMI: Estimated body mass index is 23.68 kg/m as calculated from the following:   Height as of this encounter: $RemoveBeforeD'5\' 10"'[id number]$  (1.778 m).   Weight as of this encounter: 165 lb (74.8 kg). Ideal: Ideal body weight: 73 kg (160 lb 15 oz) Adjusted ideal body weight: 73.7 kg (162 lb 9 oz)  Cervical Spine Area Exam  Skin & Axial Inspection: No masses, redness, edema, swelling, or associated skin lesions Alignment: Symmetrical Functional ROM: Unrestricted ROM      Stability: No instability detected Muscle Tone/Strength: Functionally intact. No obvious neuro-muscular anomalies detected. Sensory (Neurological): Unimpaired Palpation: No palpable anomalies              Lumbar Spine Area Exam  Skin & Axial Inspection: No masses, redness, or swelling Alignment: Symmetrical Functional ROM: Pain restricted ROM       Stability: No instability detected Muscle Tone/Strength: Functionally intact. No obvious neuro-muscular  anomalies detected. Sensory (Neurological): Musculoskeletal pain pattern articular, facet mediated Palpation: No palpable anomalies        Gait & Posture Assessment  Ambulation: Unassisted Gait: Relatively normal for age and body habitus Posture: WNL  Lower Extremity Exam    Side: Right lower extremity  Side: Left lower extremity  Stability: No instability observed          Stability: No instability observed          Skin & Extremity Inspection: Skin color, temperature, and hair growth are WNL. No peripheral edema or cyanosis. No masses, redness, swelling, asymmetry, or associated skin lesions. No contractures.  Skin & Extremity Inspection: Skin color, temperature, and hair growth are WNL. No peripheral edema or cyanosis. No masses,  redness, swelling, asymmetry, or associated skin lesions. No contractures.  Functional ROM: Unrestricted ROM                  Functional ROM: Pain restricted ROM  left ankle          Muscle Tone/Strength: Functionally intact. No obvious neuro-muscular anomalies detected.  Muscle Tone/Strength: Functionally intact. No obvious neuro-muscular anomalies detected.  Sensory (Neurological): Unimpaired        Sensory (Neurological): Arthropathic arthralgia ankle        DTR: Patellar: deferred today Achilles: deferred today Plantar: deferred today  DTR: Patellar: deferred today Achilles: deferred today Plantar: deferred today  Palpation: No palpable anomalies  Palpation: No palpable anomalies    Assessment   Diagnosis  1. Facet arthritis of lumbar region   2. Lumbar facet joint syndrome   3. Chronic pain of left ankle   4. Chronic left hip pain   5. Chronic pain syndrome   6. Chronic, continuous use of opioids   7. Primary osteoarthritis of left ankle       Plan of Care   [name] has a current medication list which includes the following long-term medication(s): bupropion, cholestyramine, donepezil, fenofibrate, gabapentin, gabapentin,  lisinopril-hydrochlorothiazide, memantine, metformin, metoprolol succinate, omega-3 acid ethyl esters, omeprazole, pramipexole, primidone, quetiapine, rosuvastatin, and [START ON 01/24/2022] trazodone.  1. Facet arthritis of lumbar region -Stable, continue to monitor  2. Lumbar facet joint syndrome  -Stable, continue to monitor  3. Chronic pain of left ankle -Referral to Dr. Zigmund Daniel to consider left ankle PRP  4. Chronic left hip pain - DG HIP UNILAT W OR W/O PELVIS 2-3 VIEWS LEFT; Future -Differential also includes left iliotibial band syndrome.  We will discuss treatment plan after x-ray results.  5. Chronic pain syndrome - DG HIP UNILAT W OR W/O PELVIS 2-3 VIEWS LEFT; Future - oxyCODONE-acetaminophen (PERCOCET) 10-325 MG tablet; Take 1 tablet by mouth every 6 (six) hours as needed for pain. Must last 30 days.  Dispense: 120 tablet; Refill: 0 - oxyCODONE-acetaminophen (PERCOCET) 10-325 MG tablet; Take 1 tablet by mouth every 6 (six) hours as needed for pain. Must last 30 days.  Dispense: 120 tablet; Refill: 0 - oxyCODONE-acetaminophen (PERCOCET) 10-325 MG tablet; Take 1 tablet by mouth every 6 (six) hours as needed for pain. Must last 30 days.  Dispense: 120 tablet; Refill: 0 - ToxASSURE Select 13 (MW), Urine  6. Chronic, continuous use of opioids - DG HIP UNILAT W OR W/O PELVIS 2-3 VIEWS LEFT; Future - oxyCODONE-acetaminophen (PERCOCET) 10-325 MG tablet; Take 1 tablet by mouth every 6 (six) hours as needed for pain. Must last 30 days.  Dispense: 120 tablet; Refill: 0 - oxyCODONE-acetaminophen (PERCOCET) 10-325 MG tablet; Take 1 tablet by mouth every 6 (six) hours as needed for pain. Must last 30 days.  Dispense: 120 tablet; Refill: 0 - oxyCODONE-acetaminophen (PERCOCET) 10-325 MG tablet; Take 1 tablet by mouth every 6 (six) hours as needed for pain. Must last 30 days.  Dispense: 120 tablet; Refill: 0  7. Primary osteoarthritis of left ankle - DG HIP UNILAT W OR W/O PELVIS 2-3 VIEWS  LEFT; Future - oxyCODONE-acetaminophen (PERCOCET) 10-325 MG tablet; Take 1 tablet by mouth every 6 (six) hours as needed for pain. Must last 30 days.  Dispense: 120 tablet; Refill: 0 - oxyCODONE-acetaminophen (PERCOCET) 10-325 MG tablet; Take 1 tablet by mouth every 6 (six) hours as needed for pain. Must last 30 days.  Dispense: 120 tablet; Refill: 0 - oxyCODONE-acetaminophen (PERCOCET)  10-325 MG tablet; Take 1 tablet by mouth every 6 (six) hours as needed for pain. Must last 30 days.  Dispense: 120 tablet; Refill: 0    Meds ordered this encounter  Medications   oxyCODONE-acetaminophen (PERCOCET) 10-325 MG tablet    Sig: Take 1 tablet by mouth every 6 (six) hours as needed for pain. Must last 30 days.    Dispense:  120 tablet    Refill:  0    Chronic Pain: STOP Act (Not applicable) Fill 1 day early if closed on refill date. Avoid benzodiazepines within 8 hours of opioids   oxyCODONE-acetaminophen (PERCOCET) 10-325 MG tablet    Sig: Take 1 tablet by mouth every 6 (six) hours as needed for pain. Must last 30 days.    Dispense:  120 tablet    Refill:  0    Chronic Pain: STOP Act (Not applicable) Fill 1 day early if closed on refill date. Avoid benzodiazepines within 8 hours of opioids   oxyCODONE-acetaminophen (PERCOCET) 10-325 MG tablet    Sig: Take 1 tablet by mouth every 6 (six) hours as needed for pain. Must last 30 days.    Dispense:  120 tablet    Refill:  0    Chronic Pain: STOP Act (Not applicable) Fill 1 day early if closed on refill date. Avoid benzodiazepines within 8 hours of opioids   Orders Placed This Encounter  Procedures   DG HIP UNILAT W OR W/O PELVIS 2-3 VIEWS LEFT    Please describe any evidence of DJD, such as joint narrowing, asymmetry, cysts, or any anomalies in bone density, production, or erosion.    Standing Status:   Future    Standing Expiration Date:   12/29/2021    Scheduling Instructions:     Imaging must be done as soon as possible. Inform patient that  order will expire within 30 days and I will not renew it.    Order Specific Question:   Reason for Exam (SYMPTOM  OR DIAGNOSIS REQUIRED)    Answer:   Left hip pain/arthralgia    Order Specific Question:   Preferred imaging location?    Answer:   [facility]    Order Specific Question:   Call Results- Best Contact Number?    Answer:   [phone] ([facility])   ToxASSURE Select 13 (MW), Urine    Volume: 30 ml(s). Minimum 3 ml of urine is needed. Document temperature of fresh sample. Indications: Long term (current) use of opiate analgesic [phone])    Order Specific Question:   Release to patient    Answer:   Immediate      Follow-up plan:   Return in about 3 months (around 02/28/2022) for Medication Management, in person.    Recent Visits Date Type Provider Dept  09/26/21 Office Visit James Santa, MD [facility]  Showing recent visits within past 90 days and meeting all other requirements Today's Visits Date Type Provider Dept  11/28/21 Office Visit James Santa, MD [facility]  Showing today's visits and meeting all other requirements Future Appointments Date Type Provider Dept  02/20/22 Appointment James Santa, MD [facility]  Showing future appointments within next 90 days and meeting all other requirements  I discussed the assessment and treatment plan with the patient. The patient was provided an opportunity to ask questions and all were answered. The patient agreed with the plan and demonstrated an understanding of the instructions.  Patient advised to call back or seek an in-person evaluation  if the symptoms or condition worsens.  Duration of encounter: 30 minutes.  Note by: James Santa, MD Date: 11/28/2021; Time: 11:00 AM

## 2021-11-28 NOTE — Progress Notes (Signed)
Nursing Pain Medication Assessment:  Safety precautions to be maintained throughout the outpatient stay will include: orient to surroundings, keep bed in low position, maintain call bell within reach at all times, provide assistance with transfer out of bed and ambulation.  Medication Inspection Compliance: Pill count conducted under aseptic conditions, in front of the patient. Neither the pills nor the bottle was removed from the patient's sight at any time. Once count was completed pills were immediately returned to the patient in their original bottle.  Medication: Oxycodone/APAP Pill/Patch Count:  95 of 120 pills remain Pill/Patch Appearance: Markings consistent with prescribed medication Bottle Appearance: Standard pharmacy container. Clearly labeled. Filled Date: 07 / 03 / 2023 Last Medication intake:  Today

## 2021-12-03 LAB — TOXASSURE SELECT 13 (MW), URINE

## 2021-12-11 ENCOUNTER — Telehealth: Payer: Self-pay

## 2021-12-11 NOTE — Telephone Encounter (Signed)
The patient called about a referral to Dr. Zigmund Daniel for his ankle. I dont see a referral placed but I see that you gave him the contact info, but he says he doesn't have it. Can you give [place] the info and we will call him with it.

## 2021-12-12 ENCOUNTER — Encounter: Payer: PPO | Admitting: Family Medicine

## 2021-12-13 ENCOUNTER — Ambulatory Visit
Admission: RE | Admit: 2021-12-13 | Discharge: 2021-12-13 | Disposition: A | Discharge status: Home / Self Care | Payer: PPO | Source: Ambulatory Visit | Attending: Student in an Organized Health Care Education/Training Program | Admitting: Student in an Organized Health Care Education/Training Program

## 2021-12-13 ENCOUNTER — Ambulatory Visit
Admission: RE | Admit: 2021-12-13 | Discharge: 2021-12-13 | Disposition: A | Discharge status: Home / Self Care | Payer: PPO | Attending: Nephrology | Admitting: Nephrology

## 2021-12-13 DIAGNOSIS — M19072 Primary osteoarthritis, left ankle and foot: Secondary | ICD-10-CM | POA: Diagnosis not present

## 2021-12-13 DIAGNOSIS — F1190 Opioid use, unspecified, uncomplicated: Secondary | ICD-10-CM | POA: Insufficient documentation

## 2021-12-13 DIAGNOSIS — G894 Chronic pain syndrome: Secondary | ICD-10-CM

## 2021-12-13 DIAGNOSIS — M25552 Pain in left hip: Secondary | ICD-10-CM | POA: Diagnosis not present

## 2021-12-13 DIAGNOSIS — G8929 Other chronic pain: Secondary | ICD-10-CM

## 2021-12-14 ENCOUNTER — Ambulatory Visit (INDEPENDENT_AMBULATORY_CARE_PROVIDER_SITE_OTHER): Payer: PPO | Admitting: Family Medicine

## 2021-12-14 ENCOUNTER — Encounter: Payer: Self-pay | Admitting: Family Medicine

## 2021-12-14 ENCOUNTER — Ambulatory Visit
Admission: RE | Admit: 2021-12-14 | Discharge: 2021-12-14 | Disposition: A | Discharge status: Home / Self Care | Payer: PPO | Attending: Family Medicine | Admitting: Family Medicine

## 2021-12-14 ENCOUNTER — Ambulatory Visit
Admission: RE | Admit: 2021-12-14 | Discharge: 2021-12-14 | Disposition: A | Discharge status: Home / Self Care | Payer: PPO | Source: Ambulatory Visit | Attending: Family Medicine | Admitting: Family Medicine

## 2021-12-14 VITALS — BP 118/78 | HR 80 | Ht 70.0 in | Wt 168.0 lb

## 2021-12-14 DIAGNOSIS — G8929 Other chronic pain: Secondary | ICD-10-CM | POA: Insufficient documentation

## 2021-12-14 DIAGNOSIS — K219 Gastro-esophageal reflux disease without esophagitis: Secondary | ICD-10-CM | POA: Diagnosis not present

## 2021-12-14 DIAGNOSIS — Z720 Tobacco use: Secondary | ICD-10-CM | POA: Diagnosis not present

## 2021-12-14 DIAGNOSIS — M25572 Pain in left ankle and joints of left foot: Secondary | ICD-10-CM

## 2021-12-14 DIAGNOSIS — K52832 Lymphocytic colitis: Secondary | ICD-10-CM | POA: Diagnosis not present

## 2021-12-14 DIAGNOSIS — Z8719 Personal history of other diseases of the digestive system: Secondary | ICD-10-CM | POA: Diagnosis not present

## 2021-12-14 NOTE — Assessment & Plan Note (Signed)
Chronic anterolateral ankle pain in the setting of reported osteoarthritis.  He has had left ankle arthroscopy from 09/29/2019 involving debridement through Dr. Sharol Given but limited response, additionally brings up corticosteroid injection roughly 6 months prior that allowed 1 month of response.  He has been utilizing pain control options through his pain management specialist Dr. Holley Raring but is still symptomatic.  Examination reveals full range of motion with pain during eversion, dorsiflexion and plantarflexion localizing to the anterolateral ankle, negative talar tilt bilaterally, negative anterior/posterior drawer, tenderness at the anterior and anterolateral joint, focally at the ATFL, otherwise nontender throughout.  We did discuss treatment strategies and given his treatments to date, he would be a good candidate for PRP injection, did discuss the experimental nature of this treatment, expectations, and possible need for repeat injections.  We also discussed the possible utility of viscosupplementation, though injection into the ankle would be considered an off label use.  We will start by obtaining updated x-rays of his left foot and ankle, await results and coordinate next steps.  Reviewed office note from Dr. [name] dated 11/28/2021

## 2021-12-14 NOTE — Progress Notes (Signed)
     Primary Care / Sports Medicine Office Visit  Patient Information:  Patient ID: James Hood, male DOB: Feb 15, 1941 Age: 81 y.o. MRN: [id number]   James Hood is a pleasant 81 y.o. male presenting with the following:  Chief Complaint  Patient presents with   Ankle Pain    Left, for years. No known injury. Operated on years ago, removed bone spurs, has hurt just as bad.     Vitals:   12/14/21 1332  BP: 118/78  Pulse: 80  SpO2: 98%   Vitals:   12/14/21 1332  Weight: 168 lb (76.2 kg)  Height: '5\' 10"'$  (1.778 m)   Body mass index is 24.11 kg/m.  No results found.   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  He has had left ankle arthroscopy from 09/29/2019 involving debridement through Dr. Sharol Given but limited response, additionally brings up corticosteroid injection roughly 6 months prior that allowed 1 month of response.  He has been utilizing pain control options through his pain management specialist Dr. Holley Raring but is still symptomatic.  Examination reveals full range of motion with pain during eversion, dorsiflexion and plantarflexion localizing to the anterolateral ankle, negative talar tilt bilaterally, negative anterior/posterior drawer, tenderness at the anterior and anterolateral joint, focally at the ATFL, otherwise nontender throughout.  We did discuss treatment strategies and given his treatments to date, he would be a good candidate for PRP injection, did discuss the experimental nature of this treatment, expectations, and possible need for repeat injections.  We also discussed the possible utility of viscosupplementation, though injection into the ankle  would be considered an off label use.  We will start by obtaining updated x-rays of his left foot and ankle, await results and coordinate next steps.  Reviewed office note from Dr. [name] dated 11/28/2021      Relevant Orders   DG Ankle Complete Left   DG Foot Complete Left     Orders & Medications No orders of the defined types were placed in this encounter.  Orders Placed This Encounter  Procedures   DG Ankle Complete Left   DG Foot Complete Left     Return if symptoms worsen or fail to improve.     Montel Culver, MD   Primary Care Sports Medicine [place]

## 2021-12-21 ENCOUNTER — Ambulatory Visit: Payer: PPO | Admitting: Orthopedic Surgery

## 2021-12-21 ENCOUNTER — Other Ambulatory Visit: Payer: Self-pay

## 2021-12-21 NOTE — Patient Instructions (Signed)
Visit Information  Thank you for taking time to visit with me today. Please don't hesitate to contact me if I can be of assistance to you.   Following are the goals we discussed today:   Goals Addressed             This Visit's Progress    Fall prevention       Care Coordination Interventions: Provided verbal education re: potential causes of falls and Fall prevention strategies Reviewed medications and discussed potential side effects of medications such as dizziness and frequent urination Advised patient of importance of notifying provider of falls Assessed for falls since last encounter Assessed patients knowledge of fall risk prevention secondary to previously provided education Assessed working status of life alert bracelet and patient adherence           If you are experiencing a Mental Health or [place] or need someone to talk to, please call the Suicide and Crisis Lifeline: 988 call 1-800-273-TALK (toll free, 24 hour hotline)  Patient verbalizes understanding of instructions and care plan provided today and agrees to view in [place]. Active MyChart status and patient understanding of how to access instructions and care plan via MyChart confirmed with patient.     No further follow up required:    Quinn Plowman [facility] Coordinator [phone]

## 2021-12-21 NOTE — Patient Outreach (Signed)
  Care Coordination   Initial Visit Note   12/21/2021 Name: James Hood MRN: [id number] DOB: 28-Oct-1940  James Hood is a 81 y.o. year old male who sees Caryl Bis, Angela Adam, MD for primary care. I spoke with  James Hood by phone today  What matters to the patients health and wellness today?  Patient reports having a few falls.  Denies serious injury with falls and denies fall being from dizziness/ lightheadedness. Patient states falls have resulted from tripping.  Patient declined ongoing follow up with RNCM. Patient states he is independent, following up with his providers as recommended and managing his health conditions.     Goals Addressed             This Visit's Progress    Fall prevention       Care Coordination Interventions: Provided verbal education re: potential causes of falls and Fall prevention strategies Reviewed medications and discussed potential side effects of medications such as dizziness and frequent urination Advised patient of importance of notifying provider of falls Assessed for falls since last encounter Assessed patients knowledge of fall risk prevention secondary to previously provided education Assessed working status of life alert bracelet and patient adherence          SDOH assessments and interventions completed:  Yes  SDOH Interventions Today    Flowsheet Row Most Recent Value  SDOH Interventions   Food Insecurity Interventions Intervention Not Indicated  Financial Strain Interventions Intervention Not Indicated  Housing Interventions Intervention Not Indicated  Transportation Interventions Intervention Not Indicated        Care Coordination Interventions Activated:  Yes  Care Coordination Interventions:  Yes, provided   Follow up plan: No further intervention required.   Encounter Outcome:  Pt. Visit Completed   Quinn Plowman RN,BSN,CCM [place] Coordinator [phone]

## 2021-12-26 ENCOUNTER — Telehealth: Payer: Self-pay

## 2021-12-26 NOTE — Telephone Encounter (Signed)
I called the patient and informed him that his medication came in and he can pick them up and he stated he would come tomorrow.  James Hood,cma   Josselyn Harkins,cma   Drug name: Ozempic     Strength: '4mg'$ /97m        Qty: 4  LOT:: [id number] Exp.Date: 23/31/2025  Dosing instructions: Inject 1 mg into the skin once a week.    The patient has been instructed regarding the correct time, dose, and frequency of taking this medication, including desired effects and most common side effects.   NGordy Councilman3:30 PM 12/26/2021

## 2021-12-27 ENCOUNTER — Other Ambulatory Visit: Payer: Self-pay

## 2021-12-27 DIAGNOSIS — E782 Mixed hyperlipidemia: Secondary | ICD-10-CM

## 2021-12-27 MED ORDER — FENOFIBRATE 145 MG PO TABS: 145.0000 mg | ORAL_TABLET | Freq: Every day | ORAL | 1 refills | Status: DC

## 2021-12-29 ENCOUNTER — Ambulatory Visit (INDEPENDENT_AMBULATORY_CARE_PROVIDER_SITE_OTHER): Payer: PPO

## 2021-12-29 ENCOUNTER — Other Ambulatory Visit: Payer: Self-pay | Admitting: Family Medicine

## 2021-12-29 ENCOUNTER — Ambulatory Visit: Payer: PPO

## 2021-12-29 VITALS — BP 120/72 | HR 83 | Temp 97.8°F | Resp 16 | Ht 70.0 in | Wt 166.4 lb

## 2021-12-29 DIAGNOSIS — Z0000 Encounter for general adult medical examination without abnormal findings: Secondary | ICD-10-CM | POA: Diagnosis not present

## 2021-12-29 DIAGNOSIS — E1142 Type 2 diabetes mellitus with diabetic polyneuropathy: Secondary | ICD-10-CM

## 2021-12-29 NOTE — Patient Instructions (Addendum)
James Hood , Thank you for taking time to come for your Medicare Wellness Visit. I appreciate your ongoing commitment to your health goals. Please review the following plan we discussed and let me know if I can assist you in the future.   These are the goals we discussed:  Goals       Patient Stated     Increase physical activity (pt-stated)      Stay active; chair exercises Healthy diet Good fluid intake      Other     Fall prevention      Care Coordination Interventions: Provided verbal education re: potential causes of falls and Fall prevention strategies Reviewed medications and discussed potential side effects of medications such as dizziness and frequent urination Advised patient of importance of notifying provider of falls Assessed for falls since last encounter Assessed patients knowledge of fall risk prevention secondary to previously provided education Assessed working status of life alert bracelet and patient adherence          This is a list of the screening recommended for you and due dates:  Health Maintenance  Topic Date Due   Flu Shot  12/19/2021   COVID-19 Vaccine (5 - Pfizer risk series) 01/14/2022*   Complete foot exam   01/03/2022   Eye exam for diabetics  02/08/2022   Hemoglobin A1C  05/12/2022   Tetanus Vaccine  01/19/2028   Pneumonia Vaccine  Completed   Zoster (Shingles) Vaccine  Completed   HPV Vaccine  Aged Out  *Topic was postponed. The date shown is not the original due date.    Opioid Pain Medicine Management Opioids are powerful medicines that are used to treat moderate to severe pain. When used for short periods of time, they can help you to: Sleep better. Do better in physical or occupational therapy. Feel better in the first few days after an injury. Recover from surgery. Opioids should be taken with the supervision of a trained health care provider. They should be taken for the shortest period of time possible. This is because  opioids can be addictive, and the longer you take opioids, the greater your risk of addiction. This addiction can also be called opioid use disorder. What are the risks? Using opioid pain medicines for longer than 3 days increases your risk of side effects. Side effects include: Constipation. Nausea and vomiting. Breathing difficulties (respiratory depression). Drowsiness. Confusion. Opioid use disorder. Itching. Taking opioid pain medicine for a long period of time can affect your ability to do daily tasks. It also puts you at risk for: Motor vehicle crashes. Depression. Suicide. Heart attack. Overdose, which can be life-threatening. What is a pain treatment plan? A pain treatment plan is an agreement between you and your health care provider. Pain is unique to each person, and treatments vary depending on your condition. To manage your pain, you and your health care provider need to work together. To help you do this: Discuss the goals of your treatment, including how much pain you might expect to have and how you will manage the pain. Review the risks and benefits of taking opioid medicines. Remember that a good treatment plan uses more than one approach and minimizes the chance of side effects. Be honest about the amount of medicines you take and about any drug or alcohol use. Get pain medicine prescriptions from only one health care provider. Pain can be managed with many types of alternative treatments. Ask your health care provider to refer you to one  or more specialists who can help you manage pain through: Physical or occupational therapy. Counseling (cognitive behavioral therapy). Good nutrition. Biofeedback. Massage. Meditation. Non-opioid medicine. Following a gentle exercise program. How to use opioid pain medicine Taking medicine Take your pain medicine exactly as told by your health care provider. Take it only when you need it. If your pain gets less severe, you may  take less than your prescribed dose if your health care provider approves. If you are not having pain, do nottake pain medicine unless your health care provider tells you to take it. If your pain is severe, do nottry to treat it yourself by taking more pills than instructed on your prescription. Contact your health care provider for help. Write down the times when you take your pain medicine. It is easy to become confused while on pain medicine. Writing the time can help you avoid overdose. Take other over-the-counter or prescription medicines only as told by your health care provider. Keeping yourself and others safe  While you are taking opioid pain medicine: Do not drive, use machinery, or power tools. Do not sign legal documents. Do not drink alcohol. Do not take sleeping pills. Do not supervise children by yourself. Do not do activities that require climbing or being in high places. Do not go to a lake, river, ocean, spa, or swimming pool. Do not share your pain medicine with anyone. Keep pain medicine in a locked cabinet or in a secure area where pets and children cannot reach it. Stopping your use of opioids If you have been taking opioid medicine for more than a few weeks, you may need to slowly decrease (taper) how much you take until you stop completely. Tapering your use of opioids can decrease your risk of symptoms of withdrawal, such as: Pain and cramping in the abdomen. Nausea. Sweating. Sleepiness. Restlessness. Uncontrollable shaking (tremors). Cravings for the medicine. Do not attempt to taper your use of opioids on your own. Talk with your health care provider about how to do this. Your health care provider may prescribe a step-down schedule based on how much medicine you are taking and how long you have been taking it. Getting rid of leftover pills Do not save any leftover pills. Get rid of leftover pills safely by: Taking the medicine to a prescription take-back  program. This is usually offered by the county or law enforcement. Bringing them to a pharmacy that has a drug disposal container. Flushing them down the toilet. Check the label or package insert of your medicine to see whether this is safe to do. Throwing them out in the trash. Check the label or package insert of your medicine to see whether this is safe to do. If it is safe to throw it out, remove the medicine from the original container, put it into a sealable bag or container, and mix it with used coffee grounds, food scraps, dirt, or cat litter before putting it in the trash. Follow these instructions at home: Activity Do exercises as told by your health care provider. Avoid activities that make your pain worse. Return to your normal activities as told by your health care provider. Ask your health care provider what activities are safe for you. General instructions You may need to take these actions to prevent or treat constipation: Drink enough fluid to keep your urine pale yellow. Take over-the-counter or prescription medicines. Eat foods that are high in fiber, such as beans, whole grains, and fresh fruits and vegetables. Limit foods that  are high in fat and processed sugars, such as fried or sweet foods. Keep all follow-up visits. This is important. Where to find support If you have been taking opioids for a long time, you may benefit from receiving support for quitting from a local support group or counselor. Ask your health care provider for a referral to these resources in your area. Where to find more information Centers for Disease Control and Prevention (CDC): http://www.wolf.info/ [place] Food and Drug Administration (FDA): [place] Get help right away if: You may have taken too much of an opioid (overdosed). Common symptoms of an overdose: Your breathing is slower or more shallow than normal. You have a very slow heartbeat (pulse). You have slurred speech. You have nausea and  vomiting. Your pupils become very small. You have other potential symptoms: You are very confused. You faint or feel like you will faint. You have cold, clammy skin. You have blue lips or fingernails. You have thoughts of harming yourself or harming others. These symptoms may represent a serious problem that is an emergency. Do not wait to see if the symptoms will go away. Get medical help right away. Call your local emergency services (911 in the [place] Do not drive yourself to the hospital.  If you ever feel like you may hurt yourself or others, or have thoughts about taking your own life, get help right away. Go to your nearest emergency department or: Call your local emergency services (911 in the [place] Call the [facility] (972)545-3994 in the [place] Call a suicide crisis helpline, such as the [place] at (904)031-0586 or 988 in the [place]. This is open 24 hours a day in the [place] Text the Crisis Text Line at 380-572-9445 (in the [place].). Summary Opioid medicines can help you manage moderate to severe pain for a short period of time. A pain treatment plan is an agreement between you and your health care provider. Discuss the goals of your treatment, including how much pain you might expect to have and how you will manage the pain. If you think that you or someone else may have taken too much of an opioid, get medical help right away. This information is not intended to replace advice given to you by your health care provider. Make sure you discuss any questions you have with your health care provider. Document Revised: 11/30/2020 Document Reviewed: 08/17/2020 Elsevier Patient Education  [place].

## 2021-12-29 NOTE — Progress Notes (Signed)
Subjective:   James Hood is a 81 y.o. male who presents for Medicare Annual/Subsequent preventive examination.  Review of Systems    No ROS.  Medicare Wellness   Cardiac Risk Factors include: advanced age (>39mn, >>40women);male gender;hypertension;diabetes mellitus     Objective:    Today's Vitals   12/29/21 1355  BP: 120/72  Pulse: 83  Resp: 16  Temp: 97.8 F (36.6 C)  Weight: 166 lb 6.4 oz (75.5 kg)  Height: '5\' 10"'$  (1.778 m)   Body mass index is 23.88 kg/m.     12/29/2021    2:26 PM 06/29/2021    8:07 AM 03/29/2021    1:39 PM 12/28/2020    9:55 AM 12/13/2020    7:52 AM 11/15/2020    8:10 AM 08/25/2020   10:01 AM  Advanced Directives  Does Patient Have a Medical Advance Directive? Yes No Yes Yes Yes Yes Yes  Type of [place]   [place] Living will;Healthcare Power of Attorney    Does patient want to make changes to medical advance directive? No - Patient declined     No - Patient declined No - Patient declined  Copy of [place]? Yes - validated most recent copy scanned in chart (See row information)   Yes - validated most recent copy scanned in chart (See row information)     Would patient like information on creating a medical advance directive?  No - Patient declined   No - Patient declined      Current Medications (verified) Outpatient Encounter Medications as of 12/29/2021  Medication Sig   aspirin EC 81 MG tablet Take 81 mg by mouth daily.   budesonide (ENTOCORT EC) 3 MG 24 hr capsule Take 2 capsules (6 mg total) by mouth daily. (Patient not taking: Reported on 12/21/2021)   buPROPion (WELLBUTRIN XL) 300 MG 24 hr tablet Take 1 tablet (300 mg total) by mouth daily.   cholestyramine (QUESTRAN) 4 g packet Take 1 packet by mouth 2 (two) times daily.   Continuous Blood Gluc Sensor (FREESTYLE LIBRE 14 DAY SENSOR) MISC APPLY 1 SENSOR EVERY 14 DAYS TO MONITOR  BLOOD GLUCOSE LEVELS. REMOVE OLD SENSOR BEFORE APPLYING NEW SENSOR   dapagliflozin propanediol (FARXIGA) 10 MG TABS tablet Take 1 tablet (10 mg total) by mouth daily.   diphenoxylate-atropine (LOMOTIL) 2.5-0.025 MG tablet Take by mouth.   divalproex (DEPAKOTE ER) 500 MG 24 hr tablet Take 1 tablet by mouth 2 (two) times daily.   donepezil (ARICEPT) 10 MG tablet Take 10 mg by mouth at bedtime.   doxycycline (VIBRA-TABS) 100 MG tablet Take 1 tablet (100 mg total) by mouth 2 (two) times daily. (Patient not taking: Reported on 12/21/2021)   fenofibrate (TRICOR) 145 MG tablet Take 1 tablet (145 mg total) by mouth daily.   ferrous sulfate 325 (65 FE) MG EC tablet Take 325 mg by mouth daily.   gabapentin (NEURONTIN) 600 MG tablet TAKE TWO TABLETS BY MOUTH THREE TIMES A DAY   gabapentin (NEURONTIN) 600 MG tablet Take by mouth.   lisinopril-hydrochlorothiazide (ZESTORETIC) 20-25 MG tablet TAKE ONE TABLET BY MOUTH TWICE A DAY   memantine (NAMENDA) 10 MG tablet Take 1 tablet by mouth daily.   metFORMIN (GLUCOPHAGE-XR) 500 MG 24 hr tablet Take 1 tablet (500 mg total) by mouth 2 (two) times daily.   metoprolol succinate (TOPROL-XL) 25 MG 24 hr tablet Take 25 mg by mouth daily.  Multiple Vitamins-Minerals (CENTRUM SILVER PO) Take 1 tablet by mouth daily.   omega-3 acid ethyl esters (LOVAZA) 1 g capsule TAKE TWO CAPSULES BY MOUTH TWICE A DAY   omeprazole (PRILOSEC) 20 MG capsule Take 20 mg by mouth daily.   oxyCODONE-acetaminophen (PERCOCET) 10-325 MG tablet Take 1 tablet by mouth every 6 (six) hours as needed for pain. Must last 30 days.   [START ON 01/19/2022] oxyCODONE-acetaminophen (PERCOCET) 10-325 MG tablet Take 1 tablet by mouth every 6 (six) hours as needed for pain. Must last 30 days.   [START ON 02/18/2022] oxyCODONE-acetaminophen (PERCOCET) 10-325 MG tablet Take 1 tablet by mouth every 6 (six) hours as needed for pain. Must last 30 days.   pramipexole (MIRAPEX) 0.125 MG tablet Take 1 tablet (0.125 mg  total) by mouth daily. Take 2-3 hours prior to bedtime.   primidone (MYSOLINE) 50 MG tablet Take 150-250 mg by mouth 2 (two) times daily. Take 250 mg by mouth in the morning & take 150 mg at night.   promethazine-dextromethorphan (PROMETHAZINE-DM) 6.25-15 MG/5ML syrup Take 5 mLs by mouth 4 (four) times daily as needed for cough. (Patient not taking: Reported on 12/21/2021)   QUEtiapine (SEROQUEL) 100 MG tablet Take 125 mg by mouth at bedtime.   rosuvastatin (CRESTOR) 40 MG tablet Take 1 tablet (40 mg total) by mouth daily.   Semaglutide, 1 MG/DOSE, (OZEMPIC, 1 MG/DOSE,) 4 MG/3ML SOPN Inject 1 mg into the skin once a week.   tamsulosin (FLOMAX) 0.4 MG CAPS capsule TAKE TWO CAPSULES BY MOUTH DAILY AFTER SUPPER   [START ON 01/24/2022] traZODone (DESYREL) 100 MG tablet Take 2 tablets (200 mg total) by mouth at bedtime as needed for sleep.   varenicline (CHANTIX) 1 MG tablet Take 1/2 tablet by mouth daily for 3 days, then 1/2 tablet by mouth twice daily for 4 days, then 1 tablet twice daily thereafter. Take with food. (Patient not taking: Reported on 12/21/2021)   vitamin C (ASCORBIC ACID) 500 MG tablet Take 500 mg by mouth daily.   No facility-administered encounter medications on file as of 12/29/2021.    Allergies (verified) Patient has no known allergies.   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   Stomach  ulcer    Tremor, essential    Wound infection complicating hardware ([place]) 04/02/2018   Past Surgical History:  Procedure Laterality Date   ANKLE ARTHROSCOPY Left 09/29/2019   Procedure: LEFT ANKLE ARTHROSCOPY, DEBRIDEMENT;  Surgeon: Newt Minion, MD;  Location: [place];  Service: Orthopedics;  Laterality: Left;   BACK SURGERY     COLONOSCOPY WITH PROPOFOL N/A 09/14/2016   Procedure: COLONOSCOPY WITH PROPOFOL;  Surgeon: Manya Silvas, MD;  Location: [facility];  Service: Endoscopy;  Laterality: N/A;   esophageal stretch  ESOPHAGOGASTRODUODENOSCOPY (EGD) WITH PROPOFOL N/A 12/13/2020   Procedure: ESOPHAGOGASTRODUODENOSCOPY (EGD) WITH PROPOFOL;  Surgeon: Lesly Rubenstein, MD;  Location: [facility] ENDOSCOPY;  Service: Endoscopy;  Laterality: N/A;  IDDM   JOINT REPLACEMENT Right 2016   knee   LUMBAR LAMINECTOMY/DECOMPRESSION MICRODISCECTOMY Left 02/03/2016   Procedure: LEFT L5-S1 DISKECTOMY;  Surgeon: Leeroy Cha, MD;  Location: [facility];  Service: Neurosurgery;  Laterality: Left;  LEFT L5-S1 DISKECTOMY   PULSE GENERATOR IMPLANT N/A 08/21/2017   Procedure: UNILATERAL PULSE GENERATOR IMPLANT;  Surgeon: Meade Maw, MD;  Location: [facility] ORS;  Service: Neurosurgery;  Laterality: N/A;   PULSE GENERATOR IMPLANT Right 04/02/2018   Procedure: REMOVAL OF PULSE GENERATOR IMPLANT AND LEADS;  Surgeon: Meade Maw, MD;  Location: [facility] ORS;  Service: Neurosurgery;  Laterality: Right;   TONSILLECTOMY     TOTAL KNEE ARTHROPLASTY Right 11/15/2014   Procedure: TOTAL KNEE ARTHROPLASTY;  Surgeon: Elsie Saas, MD;  Location: [place];  Service: Orthopedics;  Laterality: Right;   Family History  Problem Relation Age of Onset   Heart attack Mother    Heart disease Mother    Hypertension Father    Depression Father    Kidney cancer Neg Hx    Prostate cancer Neg Hx    Social History   Socioeconomic History   Marital status: Married    Spouse name: Not on file    Number of children: Not on file   Years of education: Not on file   Highest education level: Not on file  Occupational History   Not on file  Tobacco Use   Smoking status: Every Day    Packs/day: 0.50    Years: 60.00    Total pack years: 30.00    Types: Cigarettes   Smokeless tobacco: Never  Vaping Use   Vaping Use: Former  Substance and Sexual Activity   Alcohol use: Not Currently    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   Drug use: Never   Sexual activity: Not Currently  Other Topics Concern   Not on file  Social History Narrative   Not on file   Social Determinants of Health   Financial Resource Strain: Low Risk  (12/29/2021)   Overall Financial Resource Strain (CARDIA)    Difficulty of Paying Living Expenses: Not hard at all  Food Insecurity: No Food Insecurity (12/29/2021)   Hunger Vital Sign    Worried About Running Out of Food in the Last Year: Never true    [place] in the Last Year: Never true  Transportation Needs: No Transportation Needs (12/29/2021)   PRAPARE - Hydrologist (Medical): No    Lack of Transportation (Non-Medical): No  Physical Activity: Insufficiently Active (12/30/2019)   Exercise Vital Sign    Days of Exercise per Week: 3 days    Minutes of Exercise per Session: 40 min  Stress: No Stress Concern Present (12/29/2021)   [place]    Feeling of Stress : Not at all  Social Connections: Unknown (12/29/2021)   Social Connection and Isolation Panel [NHANES]    Frequency of Communication with Friends and Family: Not on file    Frequency of Social Gatherings with Friends and Family: Not on file    Attends Religious Services: Not on file    Active Member of Clubs or Organizations: Not on file    Attends Archivist Meetings: Not on file    Marital Status: Married  Tobacco Counseling Ready to quit: Not Answered Counseling given: Not  Answered  Clinical Intake: Pre-visit preparation completed: Yes       Diabetes: No  How often do you need to have someone help you when you read instructions, pamphlets, or other written materials from your doctor or pharmacy?: 1 - Never   Interpreter Needed?: No    Activities of Daily Living    12/29/2021    1:57 PM  In your present state of health, do you have any difficulty performing the following activities:  Hearing? 1  Comment Nano hearing aids  Vision? 0  Difficulty concentrating or making decisions? 1  Comment Taking medication as directed. Followed by Neurology.  Walking or climbing stairs? 0  Comment Cane as needed  Dressing or bathing? 0  Doing errands, shopping? 0  Preparing Food and eating ? N  Using the Toilet? N  In the past six months, have you accidently leaked urine? Y  Comment Managed with daily depend brief.  Do you have problems with loss of bowel control? N  Managing your Medications? Y  Comment Wife manages  Managing your Finances? Y  Comment Wife manages  Housekeeping or managing your Housekeeping? N   Patient Care Team: Leone Haven, MD as PCP - General (Family Medicine)  Indicate any recent Medical Services you may have received from other than [facility] providers in the past year (date may be approximate).     Assessment:   This is a routine wellness examination for Yanuel.  Hearing/Vision screen Hearing Screening - Comments:: Difficulty hearing conversational tones in a crowded setting. He is going to try wearing Nano hearing aids. Audiology deferred in the last year per patient preference.  Vision Screening - Comments:: Followed by [facility] Wears corrective lenses  Diabetic exam; no retinopathy reported  They have regular follow up with the ophthalmologist  Dietary issues and exercise activities discussed: Current Exercise Habits: The patient does not participate in regular exercise at present Regular diet Good water  intake   Goals Addressed               This Visit's Progress     Patient Stated     Increase physical activity (pt-stated)        Stay active; chair exercises Healthy diet Good fluid intake       Depression Screen    12/29/2021    2:24 PM 12/14/2021    1:35 PM 11/27/2021    8:28 AM 11/10/2021    8:35 AM 09/25/2021    2:07 PM 07/31/2021    1:22 PM 06/29/2021    8:07 AM  PHQ 2/9 Scores  PHQ - 2 Score 0 0  0   0  PHQ- 9 Score 0 1          Information is confidential and restricted. Go to Review Flowsheets to unlock data.    Fall Risk    12/29/2021    2:27 PM 11/28/2021    9:15 AM 11/10/2021    8:35 AM 09/26/2021    8:49 AM 08/08/2021    8:04 AM  Fall Risk   Falls in the past year? 0 1 0 0 0  Number falls in past yr: 0 0 0  0  Injury with Fall?  0 0  0  Risk for fall due to :   No Fall Risks  No Fall Risks  Follow up Falls evaluation completed  Falls evaluation completed  Falls evaluation completed  FALL RISK PREVENTION PERTAINING TO THE HOME: Home free of loose throw rugs in walkways, pet beds, electrical cords, etc? Yes  Adequate lighting in your home to reduce risk of falls? Yes   ASSISTIVE DEVICES UTILIZED TO PREVENT FALLS: Life alert? Yes  Use of a cane, walker or w/c? Yes , cane as needed. Grab bars in the bathroom? No  Shower chair or bench in shower? No  Elevated toilet seat or a handicapped toilet? No   TIMED UP AND GO: Was the test performed? Yes .  Length of time to ambulate 10 feet: 10 sec.   Gait steady and fast without use of assistive device  Cognitive Function: Patient is alert and oriented x3.  Followed by Neurology about every 6 months.  Next scheduled visit with Dr. Melrose Nakayama 03/21/22.  Last OV 09/13/21. Taking medication as directed.      11/15/2017    8:47 AM 11/14/2016    4:50 PM  MMSE - Mini Mental State Exam  Orientation to time 5 5  Orientation to Place 5 5  Registration 3 3  Attention/ Calculation 5 5  Recall 3 3  Language- name  2 objects 2 2  Language- repeat 1 1  Language- follow 3 step command 3 3  Language- read & follow direction 1 1  Write a sentence 1 1  Copy design 1 1  Total score 30 30        12/29/2021    2:34 PM 12/01/2019    9:57 AM 11/28/2018   10:23 AM  6CIT Screen  What Year? 0 points 0 points 0 points  What month? 0 points 0 points 0 points  What time? 0 points  0 points  Count back from 20   0 points  Months in reverse  0 points 0 points  Repeat phrase  0 points 0 points  Total Score   0 points    Immunizations Immunization History  Administered Date(s) Administered   Fluad Quad(high Dose 65+) 02/05/2020, 02/28/2021   Hepatitis A, Adult 12/02/2018   Influenza, High Dose Seasonal PF 01/29/2017, 03/19/2018, 01/10/2019   Influenza,inj,Quad PF,6+ Mos 03/13/2016   Influenza-Unspecified 01/21/2015   PFIZER(Purple Top)SARS-COV-2 Vaccination 06/01/2019, 06/22/2019, 03/09/2020, 08/02/2020   Pneumococcal Conjugate-13 02/07/2015   Pneumococcal Polysaccharide-23 01/18/2018   Tdap 09/16/2015, 01/18/2018   Zoster Recombinat (Shingrix) 01/03/2018, 05/09/2018   Zoster, Live 09/16/2015   Screening Tests Health Maintenance  Topic Date Due   INFLUENZA VACCINE  12/19/2021   COVID-19 Vaccine (5 - Pfizer risk series) 01/14/2022 (Originally 09/27/2020)   FOOT EXAM  01/03/2022   OPHTHALMOLOGY EXAM  02/08/2022   HEMOGLOBIN A1C  05/12/2022   TETANUS/TDAP  01/19/2028   Pneumonia Vaccine 64+ Years old  Completed   Zoster Vaccines- Shingrix  Completed   HPV VACCINES  Aged Out   Health Maintenance Health Maintenance Due  Topic Date Due   INFLUENZA VACCINE  12/19/2021   DG Chest 2 View -completed 11/10/21   Hepatitis C Screening: does not qualify  Vision Screening: Recommended annual ophthalmology exams for early detection of glaucoma and other disorders of the eye.  Dental Screening: Recommended annual dental exams for proper oral hygiene  Community Resource Referral / Chronic Care  Management: CRR required this visit?  No   CCM required this visit?  No      Plan:   Keep all routine maintenance appointments.   I have personally reviewed and noted the following in the patient's chart:   Medical and social history Use of  alcohol, tobacco or illicit drugs  Current medications and supplements including opioid prescriptions. Patient is currently taking opioid prescriptions. Information provided to patient regarding non-opioid alternatives. Patient advised to discuss non-opioid treatment plan with their provider. Followed by Dr. Holley Raring.  Functional ability and status Nutritional status Physical activity Advanced directives List of other physicians Hospitalizations, surgeries, and ER visits in previous 12 months Vitals Screenings to include cognitive, depression, and falls Referrals and appointments  In addition, I have reviewed and discussed with patient certain preventive protocols, quality metrics, and best practice recommendations. A written personalized care plan for preventive services as well as general preventive health recommendations were provided to patient.     Varney Biles, LPN   07/29/2160

## 2022-01-03 ENCOUNTER — Ambulatory Visit (INDEPENDENT_AMBULATORY_CARE_PROVIDER_SITE_OTHER): Payer: PPO | Admitting: Licensed Clinical Social Worker

## 2022-01-03 DIAGNOSIS — F3341 Major depressive disorder, recurrent, in partial remission: Secondary | ICD-10-CM

## 2022-01-03 NOTE — Plan of Care (Signed)
  Problem: Decrease depressive symptoms and improve levels of effective functioning Intervention: Encourage verbalization of feelings/concerns/expectations Note: Discussed recent thoughts/feelings triggered when sponsor discussed "step 6" Intervention: Encourage patient to identify triggers Intervention: Discuss self-management skills Note: Reviewed overall self awareness, challenging beliefs, observing self perspective, review concepts of tolerance/projection Intervention: Encourage family support

## 2022-01-03 NOTE — Progress Notes (Signed)
   THERAPIST PROGRESS NOTE  Session Time: 8-9am  [facility] in office visit for patient and LCSW clinician  Participation Level: Active  Behavioral Response: Neat and Well GroomedAlertAnxious  Type of Therapy: Individual Therapy  Treatment Goals addressed: Problem: Decrease depressive symptoms and improve levels of effective functioning Intervention: Encourage verbalization of feelings/concerns/expectations Note: Discussed recent thoughts/feelings triggered when sponsor discussed "step 6" Intervention: Encourage patient to identify triggers Intervention: Discuss self-management skills Note: Reviewed overall self awareness, challenging beliefs, observing self perspective, review concepts of tolerance/projection Intervention: Encourage family support   ProgressTowards Goals: Progressing  Interventions: CBT (see above)  Summary: James Hood is a 81 y.o. male who presents with improving symptoms related to his depression diagnosis. Pt reports overall mood is stable and that he is managing situational stressors better.   Allowed pt to explore and express thoughts and feelings associated with recent life situations and external stressors.   Continued recommendations are as follows: self care behaviors, positive social engagements, focusing on overall work/home/life balance, and focusing on positive physical and emotional wellness.    Suicidal/Homicidal: No  Therapist Response: Pt is continuing to apply interventions learned in session into daily life situations. Pt is currently on track to meet goals utilizing interventions mentioned above. Personal growth and progress noted. Treatment to continue as indicated.   Plan: Return again in 4 weeks.  Diagnosis: No diagnosis found.  Collaboration of Care: Other Pt encouraged to continue psychiatric care with Dr. Modesta Messing  Patient/Guardian was advised Release of Information must be obtained prior to any record release in order to  collaborate their care with an outside provider. Patient/Guardian was advised if they have not already done so to contact the registration department to sign all necessary forms in order for [place] to release information regarding their care.   Consent: Patient/Guardian gives verbal consent for treatment and assignment of benefits for services provided during this visit. Patient/Guardian expressed understanding and agreed to proceed.   [place], LCSW 01/03/2022

## 2022-01-09 DIAGNOSIS — F319 Bipolar disorder, unspecified: Secondary | ICD-10-CM | POA: Diagnosis not present

## 2022-01-09 DIAGNOSIS — I10 Essential (primary) hypertension: Secondary | ICD-10-CM | POA: Diagnosis not present

## 2022-01-09 DIAGNOSIS — F0390 Unspecified dementia without behavioral disturbance: Secondary | ICD-10-CM | POA: Diagnosis not present

## 2022-01-09 DIAGNOSIS — J449 Chronic obstructive pulmonary disease, unspecified: Secondary | ICD-10-CM | POA: Diagnosis not present

## 2022-01-09 DIAGNOSIS — F17200 Nicotine dependence, unspecified, uncomplicated: Secondary | ICD-10-CM | POA: Diagnosis not present

## 2022-01-09 DIAGNOSIS — Z7984 Long term (current) use of oral hypoglycemic drugs: Secondary | ICD-10-CM | POA: Diagnosis not present

## 2022-01-09 DIAGNOSIS — N1831 Chronic kidney disease, stage 3a: Secondary | ICD-10-CM | POA: Diagnosis not present

## 2022-01-09 DIAGNOSIS — E1142 Type 2 diabetes mellitus with diabetic polyneuropathy: Secondary | ICD-10-CM | POA: Diagnosis not present

## 2022-01-09 DIAGNOSIS — M461 Sacroiliitis, not elsewhere classified: Secondary | ICD-10-CM | POA: Diagnosis not present

## 2022-01-09 DIAGNOSIS — E1122 Type 2 diabetes mellitus with diabetic chronic kidney disease: Secondary | ICD-10-CM | POA: Diagnosis not present

## 2022-01-09 DIAGNOSIS — Z6823 Body mass index (BMI) 23.0-23.9, adult: Secondary | ICD-10-CM | POA: Diagnosis not present

## 2022-01-09 DIAGNOSIS — F1120 Opioid dependence, uncomplicated: Secondary | ICD-10-CM | POA: Diagnosis not present

## 2022-01-10 ENCOUNTER — Telehealth: Payer: Self-pay

## 2022-01-10 NOTE — Telephone Encounter (Signed)
Pt. Calling for imaging results. Agent states pt. Disconnected line. Left message for pt. To call back.

## 2022-01-10 NOTE — Telephone Encounter (Signed)
Please review.  KP

## 2022-01-10 NOTE — Telephone Encounter (Signed)
Pt. states he would like to try the gel injection with Dr. Zigmund Daniel. Please advise.

## 2022-01-12 NOTE — Telephone Encounter (Signed)
Spoke to pt he is interested in PRP. Pt is aware that its 500$.  KP

## 2022-01-28 ENCOUNTER — Other Ambulatory Visit: Payer: Self-pay | Admitting: Family Medicine

## 2022-01-28 DIAGNOSIS — G2581 Restless legs syndrome: Secondary | ICD-10-CM

## 2022-01-29 ENCOUNTER — Ambulatory Visit (INDEPENDENT_AMBULATORY_CARE_PROVIDER_SITE_OTHER): Payer: PPO

## 2022-01-29 ENCOUNTER — Encounter: Payer: Self-pay | Admitting: Podiatry

## 2022-01-29 ENCOUNTER — Ambulatory Visit: Payer: PPO | Admitting: Podiatry

## 2022-01-29 DIAGNOSIS — M778 Other enthesopathies, not elsewhere classified: Secondary | ICD-10-CM

## 2022-01-29 NOTE — Progress Notes (Signed)
He presents today after having not seen him for quite a while.  He states that he has been having pain in his left foot of his for the past 7 or 8 years.  States that he has seen Dr. Sharol Given for an ankle arthroscopy which helped absolutely 0%.  He states that my foot hurts as bad or worse now than it did before that procedure.  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  The stimulator and its leads became infected which had to be removed.  And states that his foot still hurts.  He states that while he did have the stimulator his foot felt much better.  Objective: Vital signs are stable he is alert and oriented x3.  Pulses are palpable.  He has some pain on palpation of the fifth metatarsal base left foot he has significant pain on palpation of the sinus tarsi left foot he also has significant pain on palpation of the Lisfranc's joints dorsal and plantarly.  Significant pain on end range of motion of the ankle joint.  Dorsiflexion plantar flexors inverters everters are all musculature appears to be intact.  Gait is antalgic and he leans to the right when he walks  Radiographs taken today do not demonstrate significant osseous abnormalities in the forefoot midfoot or rear foot left.  He does demonstrate some ankle joint narrowing.  I do think that some of the pain across the dorsum of the foot is probably neuropathic change associated with the early osteoarthritic change.  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  Plan: At this point were going to request an MRI to capture the midfoot osteoarthritic change as well as significant changes in the ankle joint.  This is for follow-up evaluation postsurgical as well as differential diagnoses and surgical planning.

## 2022-01-31 DIAGNOSIS — H2513 Age-related nuclear cataract, bilateral: Secondary | ICD-10-CM | POA: Diagnosis not present

## 2022-01-31 LAB — HM DIABETES EYE EXAM

## 2022-02-04 ENCOUNTER — Ambulatory Visit
Admission: RE | Admit: 2022-02-04 | Discharge: 2022-02-04 | Disposition: A | Discharge status: Home / Self Care | Payer: PPO | Source: Ambulatory Visit | Attending: Podiatry | Admitting: Podiatry

## 2022-02-04 DIAGNOSIS — M25472 Effusion, left ankle: Secondary | ICD-10-CM | POA: Diagnosis not present

## 2022-02-04 DIAGNOSIS — M778 Other enthesopathies, not elsewhere classified: Secondary | ICD-10-CM

## 2022-02-07 ENCOUNTER — Ambulatory Visit: Payer: PPO | Admitting: Podiatry

## 2022-02-08 ENCOUNTER — Other Ambulatory Visit: Payer: Self-pay | Admitting: Family Medicine

## 2022-02-08 DIAGNOSIS — N400 Enlarged prostate without lower urinary tract symptoms: Secondary | ICD-10-CM

## 2022-02-13 ENCOUNTER — Encounter: Payer: Self-pay | Admitting: Family Medicine

## 2022-02-13 ENCOUNTER — Ambulatory Visit (INDEPENDENT_AMBULATORY_CARE_PROVIDER_SITE_OTHER): Payer: PPO | Admitting: Family Medicine

## 2022-02-13 DIAGNOSIS — G2581 Restless legs syndrome: Secondary | ICD-10-CM | POA: Diagnosis not present

## 2022-02-13 DIAGNOSIS — E1142 Type 2 diabetes mellitus with diabetic polyneuropathy: Secondary | ICD-10-CM

## 2022-02-13 DIAGNOSIS — I10 Essential (primary) hypertension: Secondary | ICD-10-CM

## 2022-02-13 DIAGNOSIS — M19072 Primary osteoarthritis, left ankle and foot: Secondary | ICD-10-CM | POA: Diagnosis not present

## 2022-02-13 LAB — BASIC METABOLIC PANEL
BUN: 31 mg/dL — ABNORMAL HIGH (ref 6–23)
CO2: 31 mEq/L (ref 19–32)
Calcium: 9.9 mg/dL (ref 8.4–10.5)
Chloride: 99 mEq/L (ref 96–112)
Creatinine, Ser: 1.33 mg/dL (ref 0.40–1.50)
GFR: 49.94 mL/min — ABNORMAL LOW (ref >60.00)
Glucose, Bld: 163 mg/dL — ABNORMAL HIGH (ref 70–99)
Potassium: 3.9 mEq/L (ref 3.5–5.1)
Sodium: 137 mEq/L (ref 135–145)

## 2022-02-13 LAB — HEMOGLOBIN A1C: Hgb A1c MFr Bld: 7 % — ABNORMAL HIGH (ref 4.6–6.5)

## 2022-02-13 NOTE — Assessment & Plan Note (Signed)
Seems to be adequately controlled based on his target range glucoses on his CGM.  He has only been checking twice a day and I encouraged him to scan his CGM at least every 8 hours.  We will check an A1c today.  He will continue metformin XR 500 mg twice daily, Ozempic 1 mg weekly, and Farxiga 10 mg daily.

## 2022-02-13 NOTE — Assessment & Plan Note (Signed)
Patient's ankle pain may be related to osteoarthritis that he could have some measure of peroneal tendinitis.  He will see podiatry tomorrow as planned.

## 2022-02-13 NOTE — Progress Notes (Signed)
James Rumps, MD Phone: [phone]  James Hood is a 81 y.o. male who presents today for f/u.  DIABETES Disease Monitoring: Blood Sugar ranges-92% in target over the last 90 days, 5% above target, and 3% low polyuria/phagia/dipsia-no       Medications: Compliance-taking metformin, Ozempic, and Farxiga hypoglycemic symptoms-has had some lows at night though none in the last couple of weeks  Hypertension: Not checking blood pressures.  He is taking lisinopril and HCTZ.  No chest pain, shortness of breath, edema, or lightheadedness.  Restless leg syndrome: Patient notes the Mirapex has helped significantly.  He is only had 1 night where he had issues with restless legs since going on this.  He notes no side effects.  Chronic left foot and ankle pain: He has been seeing podiatry for this.  Had a recent MRI that revealed arthritic changes and osteochondral injury about the medial aspect of the talar dome.  He notes the pain occurs in his left lateral ankle inferior and posterior to the malleolus.  Notes it is constant.  His chronic pain medications do not help with the pain.   Social History   Tobacco Use  Smoking Status Every Day   Packs/day: 0.50   Years: 60.00   Total pack years: 30.00   Types: Cigarettes  Smokeless Tobacco Never    Current Outpatient Medications on File Prior to Visit  Medication Sig Dispense Refill   aspirin EC 81 MG tablet Take 81 mg by mouth daily.     buPROPion (WELLBUTRIN XL) 300 MG 24 hr tablet Take 1 tablet (300 mg total) by mouth daily. 90 tablet 0   cholestyramine (QUESTRAN) 4 g packet Take 1 packet by mouth 2 (two) times daily.     Continuous Blood Gluc Sensor (FREESTYLE LIBRE 14 DAY SENSOR) MISC APPLY 1 SENSOR EVERY 14 DAYS TO MONITOR BLOOD GLUCOSE LEVELS. REMOVE OLD SENSOR BEFORE APPLYING NEW SENSOR 2 each 5   dapagliflozin propanediol (FARXIGA) 10 MG TABS tablet Take 1 tablet (10 mg total) by mouth daily. 90 tablet 3    diphenoxylate-atropine (LOMOTIL) 2.5-0.025 MG tablet Take by mouth.     divalproex (DEPAKOTE ER) 500 MG 24 hr tablet Take 1 tablet by mouth 2 (two) times daily.     donepezil (ARICEPT) 10 MG tablet Take 10 mg by mouth at bedtime.     fenofibrate (TRICOR) 145 MG tablet Take 1 tablet (145 mg total) by mouth daily. 90 tablet 1   ferrous sulfate 325 (65 FE) MG EC tablet Take 325 mg by mouth daily.     gabapentin (NEURONTIN) 600 MG tablet TAKE TWO TABLETS BY MOUTH THREE TIMES A DAY 180 tablet 1   lisinopril-hydrochlorothiazide (ZESTORETIC) 20-25 MG tablet TAKE ONE TABLET BY MOUTH TWICE A DAY 180 tablet 1   memantine (NAMENDA) 10 MG tablet Take 1 tablet by mouth daily.     metFORMIN (GLUCOPHAGE-XR) 500 MG 24 hr tablet TAKE ONE TABLET BY MOUTH TWICE A DAY 180 tablet 1   metoprolol succinate (TOPROL-XL) 25 MG 24 hr tablet Take 25 mg by mouth daily.     Multiple Vitamins-Minerals (CENTRUM SILVER PO) Take 1 tablet by mouth daily.     omega-3 acid ethyl esters (LOVAZA) 1 g capsule TAKE TWO CAPSULES BY MOUTH TWICE A DAY 360 capsule 3   omeprazole (PRILOSEC) 20 MG capsule Take 20 mg by mouth daily.     [START ON 02/18/2022] oxyCODONE-acetaminophen (PERCOCET) 10-325 MG tablet Take 1 tablet by mouth every 6 (six) hours as  needed for pain. Must last 30 days. 120 tablet 0   pramipexole (MIRAPEX) 0.125 MG tablet TAKE 1 TABLET BY MOUTH DAILY TAKE 2 TO 3 HOURS PRIOR TO BEDTIME 90 tablet 0   primidone (MYSOLINE) 50 MG tablet Take 150-250 mg by mouth 2 (two) times daily. Take 250 mg by mouth in the morning & take 150 mg at night.     QUEtiapine (SEROQUEL) 100 MG tablet Take 125 mg by mouth at bedtime.     rosuvastatin (CRESTOR) 40 MG tablet Take 1 tablet (40 mg total) by mouth daily. 90 tablet 3   Semaglutide, 1 MG/DOSE, (OZEMPIC, 1 MG/DOSE,) 4 MG/3ML SOPN Inject 1 mg into the skin once a week. 3 mL 1   tamsulosin (FLOMAX) 0.4 MG CAPS capsule TAKE TWO CAPSULES BY MOUTH DAILY AFTER SUPPER 180 capsule 0   traZODone  (DESYREL) 100 MG tablet Take 2 tablets (200 mg total) by mouth at bedtime as needed for sleep. 180 tablet 0   varenicline (CHANTIX) 1 MG tablet Take 1/2 tablet by mouth daily for 3 days, then 1/2 tablet by mouth twice daily for 4 days, then 1 tablet twice daily thereafter. Take with food. 60 tablet 5   vitamin C (ASCORBIC ACID) 500 MG tablet Take 500 mg by mouth daily.     budesonide (ENTOCORT EC) 3 MG 24 hr capsule Take 2 capsules (6 mg total) by mouth daily. (Patient not taking: Reported on 02/13/2022) 180 capsule 0   gabapentin (NEURONTIN) 600 MG tablet Take by mouth. (Patient not taking: Reported on 02/13/2022)     oxyCODONE-acetaminophen (PERCOCET) 10-325 MG tablet Take 1 tablet by mouth every 6 (six) hours as needed for pain. Must last 30 days. (Patient not taking: Reported on 02/13/2022) 120 tablet 0   promethazine-dextromethorphan (PROMETHAZINE-DM) 6.25-15 MG/5ML syrup Take 5 mLs by mouth 4 (four) times daily as needed for cough. (Patient not taking: Reported on 02/13/2022) 118 mL 0   No current facility-administered medications on file prior to visit.     ROS see history of present illness  Objective  Physical Exam Vitals:   02/13/22 1116 02/13/22 1140  BP: 90/60 118/60  Pulse: 77   Temp: 97.6 F (36.4 C)   SpO2: 93%     BP Readings from Last 3 Encounters:  02/13/22 118/60  12/29/21 120/72  12/14/21 118/78   Wt Readings from Last 3 Encounters:  02/13/22 165 lb 9.6 oz (75.1 kg)  12/29/21 166 lb 6.4 oz (75.5 kg)  12/14/21 168 lb (76.2 kg)    Physical Exam Constitutional:      General: He is not in acute distress.    Appearance: He is not diaphoretic.  Cardiovascular:     Rate and Rhythm: Normal rate and regular rhythm.     Heart sounds: Normal heart sounds.  Pulmonary:     Effort: Pulmonary effort is normal.     Breath sounds: Normal breath sounds.  Musculoskeletal:     Comments: Patient has no swelling in his left ankle, he has tenderness inferior and posterior to  the lateral malleolus within the soft tissues in this area and has soft tissue tenderness up the lateral aspect of his left lower leg, he has discomfort on resisted eversion of the left foot, no bony tenderness within his left foot, no medial left ankle tenderness  Skin:    General: Skin is warm and dry.  Neurological:     Mental Status: He is alert.      Assessment/Plan: Please see individual  problem list.  Problem List Items Addressed This Visit     Hypertension (Chronic)    Well-controlled.  He will continue lisinopril-HCTZ 20-25 mg twice daily.  Check labs.      Relevant Orders   Basic Metabolic Panel (BMET)   Type 2 diabetes mellitus ([place]) (Chronic)    Seems to be adequately controlled based on his target range glucoses on his CGM.  He has only been checking twice a day and I encouraged him to scan his CGM at least every 8 hours.  We will check an A1c today.  He will continue metformin XR 500 mg twice daily, Ozempic 1 mg weekly, and Farxiga 10 mg daily.      Relevant Orders   HgB A1c   Primary osteoarthritis of left ankle    Patient's ankle pain may be related to osteoarthritis that he could have some measure of peroneal tendinitis.  He will see podiatry tomorrow as planned.      RLS (restless legs syndrome)    Much improved.  He will continue Mirapex 0.125 mg 2 to 3 hours before bedtime.      Return in about 3 months (around 05/15/2022).   James Rumps, MD [place]

## 2022-02-13 NOTE — Assessment & Plan Note (Signed)
Well-controlled.  He will continue lisinopril-HCTZ 20-25 mg twice daily.  Check labs.

## 2022-02-13 NOTE — Patient Instructions (Signed)
Nice to see you. We will contact you with your lab results. Please see podiatry as planned tomorrow.

## 2022-02-13 NOTE — Assessment & Plan Note (Signed)
Much improved.  He will continue Mirapex 0.125 mg 2 to 3 hours before bedtime.

## 2022-02-14 ENCOUNTER — Ambulatory Visit: Payer: PPO | Admitting: Podiatry

## 2022-02-14 DIAGNOSIS — M19072 Primary osteoarthritis, left ankle and foot: Secondary | ICD-10-CM | POA: Diagnosis not present

## 2022-02-14 DIAGNOSIS — M958 Other specified acquired deformities of musculoskeletal system: Secondary | ICD-10-CM

## 2022-02-15 ENCOUNTER — Ambulatory Visit (INDEPENDENT_AMBULATORY_CARE_PROVIDER_SITE_OTHER): Payer: PPO | Admitting: Licensed Clinical Social Worker

## 2022-02-15 DIAGNOSIS — F3341 Major depressive disorder, recurrent, in partial remission: Secondary | ICD-10-CM

## 2022-02-15 NOTE — Progress Notes (Signed)
Virtual Visit via Audio Note  I connected with James Hood on 02/15/22 at  9:00 AM EDT by an audio enabled telemedicine application and verified that I am speaking with the correct person using two identifiers.  Location: Patient: home Provider: [facility]   I discussed the limitations of evaluation and management by telemedicine and the availability of in person appointments. The patient expressed understanding and agreed to proceed.   I discussed the assessment and treatment plan with the patient. The patient was provided an opportunity to ask questions and all were answered. The patient agreed with the plan and demonstrated an understanding of the instructions.   The patient was advised to call back or seek an in-person evaluation if the symptoms worsen or if the condition fails to improve as anticipated.  I provided 45 minutes of non-face-to-face time during this encounter.   Kanyon Bunn R Gardenia Witter, LCSW   THERAPIST PROGRESS NOTE  Session Time: 915-10a  Participation Level: Active  Behavioral Response: Neat and Well GroomedAlertAnxious  Type of Therapy: Individual Therapy  Treatment Goals addressed: Problem: Decrease depressive symptoms and improve levels of effective functioning Goal: LTG: Reduce frequency, intensity, and duration of depression symptoms as evidenced by: pt self report Outcome: Progressing Goal: STG: '@PREFFIRSTNAME'$ @ will participate in at least 80% of scheduled individual psychotherapy sessions Outcome: Progressing Intervention: Encourage verbalization of feelings/concerns/expectations Note: Explored/allowed pt to express.  Intervention: Encourage patient to identify triggers Note: Reviewed/identified triggers Intervention: Discuss self-management skills Note: Explored emotion regulation tools  ProgressTowards Goals: Progressing  Interventions: CBT  Summary: James Hood is a 81 y.o. male who presents with  improving symptoms related to his depression diagnosis. Pt reports overall mood is stable and that he is managing situational stressors better.   Allowed pt to explore and express thoughts and feelings associated with recent life situations and external stressors. Pt reports that he is continuing to experience significant pain in his ankle--day and night. Pt reports that the pain has a negative psychological impact--pt feels more grumpy with others and is just "not in a good mood". Discussed current pain management strategies. Pt feels that he may need surgery in the future.  Discussed ongoing life stressors involving his wife--pt and wife very close so when things are going on with her pt reports that it impacts him as well. Pt states that his wife and her daughter got into an argument that triggered significant stress in pt and his wife. Pt reached out to his stepdaughter to allow him the opportunity to voice his thoughts about the situation. It was a good conversation and pt feels good that he was able to assist in positive communication between his wife and daughter.  Explored traumas from the past (pts relationship w/ father). Discussed psychological impact of pts father's harsh critiquing style of parenting and the long lasting effects of that. Allowed pt to identify strengths and feel empowered by choices that he has made in his own life.   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  Continued recommendations are as follows: self care behaviors, positive social engagements, focusing on overall work/home/life balance, and focusing on positive physical and emotional wellness.  Suicidal/Homicidal: No  Therapist Response: Pt is continuing to apply interventions learned in session into daily life situations. Pt is currently on track to meet goals utilizing interventions mentioned above. Personal growth and progress noted. Treatment to continue as  indicated.   Plan: Return again in 4 weeks.  Diagnosis:  Encounter Diagnosis  Name Primary?   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 Norman Clay  Patient/Guardian was advised Release of Information must be obtained prior to any record release in order to collaborate their care with an outside provider. Patient/Guardian was advised if they have not already done so to contact the registration department to sign all necessary forms in order for [place] to release information regarding their care.   Consent: Patient/Guardian gives verbal consent for treatment and assignment of benefits for services provided during this visit. Patient/Guardian expressed understanding and agreed to proceed.   [place], LCSW 02/15/2022

## 2022-02-15 NOTE — Plan of Care (Signed)
  Problem: Decrease depressive symptoms and improve levels of effective functioning Goal: LTG: Reduce frequency, intensity, and duration of depression symptoms as evidenced by: pt self report Outcome: Progressing Goal: STG: '@PREFFIRSTNAME'$ @ will participate in at least 80% of scheduled individual psychotherapy sessions Outcome: Progressing Intervention: Encourage verbalization of feelings/concerns/expectations Note: Explored/allowed pt to express.  Intervention: Encourage patient to identify triggers Note: Reviewed/identified triggers Intervention: Discuss self-management skills Note: Explored emotion regulation tools

## 2022-02-16 NOTE — Progress Notes (Signed)
  Subjective:  Patient ID: James Hood, male    DOB: 11-Aug-1940,  MRN: [id number]  Chief Complaint  Patient presents with   Consult    Review MRI - Dr Milinda Pointer referral    81 y.o. male presents with the above complaint. History confirmed with patient.  He returns after completing the MRI to follow-up on his left foot pain this has been quite severe and quite limiting for him.  Objective:  Physical Exam: warm, good capillary refill, no trophic changes or ulcerative lesions, normal DP and PT pulses, normal sensory exam, and he has limited range of motion of the hindfoot and ankle, he does not have any pain to palpation of the anterior ankle joint line or with range of motion of the ankle itself.  Minimal in the subtalar joint.  He does have sharp severe pain over the anterior process of calcaneus and the calcaneocuboid joint.      IMPRESSION: 1. 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   2.  Ligaments and tendons are intact.   3. 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 Subtalar joints are unremarkable.   4. Mild subcutaneous soft tissue edema. No fluid collection or hematoma. No evidence of muscle strain.     Electronically Signed   By: Keane Police D.O.   On: 02/05/2022 22:43 Assessment:   1. Primary osteoarthritis of left foot   2. Osteochondral defect of ankle      Plan:  Patient was evaluated and treated and all questions answered.  Reviewed his MRI findings together.  We discussed the presence of the osteochondral lesion.  Currently I am not able to elicit any symptoms in this area.  Dr. Milinda Pointer previously has done a sinus tarsi and ankle joint injection these did not offer much relief for him.  Today most of his pain appears to be at the calcaneocuboid joint.  I discussed treatment of this with, I did recommend a diagnostic and therapeutic injection.  The seem to be the area of his  most focused pain.  I do not think currently there is indication to treat or address the osteochondral lesion.  I discussed with him that long-term arthritis of the calcaneocuboid joint would likely require arthrodesis and that this is relatively extensive.  Following sterile prep with Betadine 10 mg of Kenalog was injected directly into the calcaneocuboid joint with 0.5 cc of lidocaine 2%.  He tolerated well and was dressed with a bandage.  I also recommended supporting the foot with a custom AFO.  Prescription was written for him to go to [facility] to have a [employer] fashioned  Return in about 1 month (around 03/16/2022) for follow up from injection .

## 2022-02-17 NOTE — Progress Notes (Unsigned)
[place] MD/PA/NP OP Progress Note  02/17/2022 10:36 AM James Hood  MRN:  [id number]  Chief Complaint: No chief complaint on file.  HPI: *** Visit Diagnosis: No diagnosis found.  Past Psychiatric History: Please see initial evaluation for full details. I have reviewed the history. No updates at this time.     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[place])    History of hiatal hernia    Hypercholesteremia    Hypertension    Hyperthyroidism    Neuropathic pain    Primary localized osteoarthritis of right knee    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   Stomach ulcer    Tremor, essential    Wound infection complicating hardware ([place]) 04/02/2018    Past Surgical History:  Procedure Laterality Date   ANKLE ARTHROSCOPY Left 09/29/2019   Procedure: LEFT ANKLE ARTHROSCOPY, DEBRIDEMENT;  Surgeon: Newt Minion, MD;  Location: [facility];  Service: Orthopedics;  Laterality: Left;   BACK SURGERY     COLONOSCOPY WITH PROPOFOL N/A 09/14/2016   Procedure: COLONOSCOPY WITH PROPOFOL;  Surgeon: Manya Silvas, MD;  Location: [facility] ENDOSCOPY;  Service: Endoscopy;  Laterality: N/A;   esophageal stretch     ESOPHAGOGASTRODUODENOSCOPY (EGD) WITH PROPOFOL N/A 12/13/2020   Procedure: ESOPHAGOGASTRODUODENOSCOPY (EGD) WITH PROPOFOL;  Surgeon: Lesly Rubenstein, MD;  Location: [facility] ENDOSCOPY;  Service: Endoscopy;  Laterality: N/A;  IDDM   JOINT REPLACEMENT Right 2016   knee   LUMBAR LAMINECTOMY/DECOMPRESSION MICRODISCECTOMY Left 02/03/2016   Procedure: LEFT L5-S1 DISKECTOMY;   Surgeon: Leeroy Cha, MD;  Location: [facility];  Service: Neurosurgery;  Laterality: Left;  LEFT L5-S1 DISKECTOMY   PULSE GENERATOR IMPLANT N/A 08/21/2017   Procedure: UNILATERAL PULSE GENERATOR IMPLANT;  Surgeon: Meade Maw, MD;  Location: [facility] ORS;  Service: Neurosurgery;  Laterality: N/A;   PULSE GENERATOR IMPLANT Right 04/02/2018   Procedure: REMOVAL OF PULSE GENERATOR IMPLANT AND LEADS;  Surgeon: Meade Maw, MD;  Location: [facility] ORS;  Service: Neurosurgery;  Laterality: Right;   TONSILLECTOMY     TOTAL KNEE ARTHROPLASTY Right 11/15/2014   Procedure: TOTAL KNEE ARTHROPLASTY;  Surgeon: Elsie Saas, MD;  Location: [facility];  Service: Orthopedics;  Laterality: Right;    Family Psychiatric History: Please see initial evaluation for full details. I have reviewed the history. No updates at this time.     Family History:  Family History  Problem Relation Age of Onset   Heart attack Mother    Heart disease Mother    Hypertension Father    Depression Father    Kidney cancer Neg Hx    Prostate cancer Neg Hx     Social History:  Social History   Socioeconomic History   Marital status: Married    Spouse name: Not on file   Number of children: Not on file   Years of education: Not on file   Highest education level: Not on file  Occupational History   Not on file  Tobacco Use   Smoking status: Every Day  Packs/day: 0.50    Years: 60.00    Total pack years: 30.00    Types: Cigarettes   Smokeless tobacco: Never  Vaping Use   Vaping Use: Former  Substance and Sexual Activity   Alcohol use: Not Currently    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   Drug use: Never   Sexual activity: Not Currently  Other Topics Concern   Not on file  Social History Narrative   Not on file   Social Determinants of Health   Financial Resource Strain: Low Risk  (12/29/2021)   Overall Financial Resource Strain (CARDIA)    Difficulty of Paying Living Expenses: Not hard at  all  Food Insecurity: No Food Insecurity (12/29/2021)   Hunger Vital Sign    Worried About Running Out of Food in the Last Year: Never true    Ran Out of Food in the Last Year: Never true  Transportation Needs: No Transportation Needs (12/29/2021)   PRAPARE - Hydrologist (Medical): No    Lack of Transportation (Non-Medical): No  Physical Activity: Insufficiently Active (12/30/2019)   Exercise Vital Sign    Days of Exercise per Week: 3 days    Minutes of Exercise per Session: 40 min  Stress: No Stress Concern Present (12/29/2021)   [place]    Feeling of Stress : Not at all  Social Connections: Unknown (12/29/2021)   Social Connection and Isolation Panel [NHANES]    Frequency of Communication with Friends and Family: Not on file    Frequency of Social Gatherings with Friends and Family: Not on file    Attends Religious Services: Not on file    Active Member of Clubs or Organizations: Not on file    Attends Archivist Meetings: Not on file    Marital Status: Married    Allergies: No Known Allergies  Metabolic Disorder Labs: Lab Results  Component Value Date   HGBA1C 7.0 (H) 02/13/2022   MPG 131 01/25/2016   MPG 166 11/05/2014   No results found for: "PROLACTIN" Lab Results  Component Value Date   CHOL 186 08/02/2021   TRIG 304.0 (H) 08/02/2021   HDL 59.10 08/02/2021   CHOLHDL 3 08/02/2021   VLDL 60.8 (H) 08/02/2021   LDLCALC 57 09/25/2017   Lab Results  Component Value Date   TSH 1.08 08/02/2021   TSH 0.97 10/11/2020    Therapeutic Level Labs: No results found for: "LITHIUM" No results found for: "VALPROATE" No results found for: "CBMZ"  Current Medications: Current Outpatient Medications  Medication Sig Dispense Refill   aspirin EC 81 MG tablet Take 81 mg by mouth daily.     budesonide (ENTOCORT EC) 3 MG 24 hr capsule Take 2 capsules (6 mg total) by mouth  daily. (Patient not taking: Reported on 02/13/2022) 180 capsule 0   buPROPion (WELLBUTRIN XL) 300 MG 24 hr tablet Take 1 tablet (300 mg total) by mouth daily. 90 tablet 0   cholestyramine (QUESTRAN) 4 g packet Take 1 packet by mouth 2 (two) times daily.     Continuous Blood Gluc Sensor (FREESTYLE LIBRE 14 DAY SENSOR) MISC APPLY 1 SENSOR EVERY 14 DAYS TO MONITOR BLOOD GLUCOSE LEVELS. REMOVE OLD SENSOR BEFORE APPLYING NEW SENSOR 2 each 5   dapagliflozin propanediol (FARXIGA) 10 MG TABS tablet Take 1 tablet (10 mg total) by mouth daily. 90 tablet 3   diphenoxylate-atropine (LOMOTIL) 2.5-0.025 MG tablet Take by mouth.  divalproex (DEPAKOTE ER) 500 MG 24 hr tablet Take 1 tablet by mouth 2 (two) times daily.     donepezil (ARICEPT) 10 MG tablet Take 10 mg by mouth at bedtime.     fenofibrate (TRICOR) 145 MG tablet Take 1 tablet (145 mg total) by mouth daily. 90 tablet 1   ferrous sulfate 325 (65 FE) MG EC tablet Take 325 mg by mouth daily.     gabapentin (NEURONTIN) 600 MG tablet TAKE TWO TABLETS BY MOUTH THREE TIMES A DAY 180 tablet 1   gabapentin (NEURONTIN) 600 MG tablet Take by mouth. (Patient not taking: Reported on 02/13/2022)     lisinopril-hydrochlorothiazide (ZESTORETIC) 20-25 MG tablet TAKE ONE TABLET BY MOUTH TWICE A DAY 180 tablet 1   memantine (NAMENDA) 10 MG tablet Take 1 tablet by mouth daily.     metFORMIN (GLUCOPHAGE-XR) 500 MG 24 hr tablet TAKE ONE TABLET BY MOUTH TWICE A DAY 180 tablet 1   metoprolol succinate (TOPROL-XL) 25 MG 24 hr tablet Take 25 mg by mouth daily.     Multiple Vitamins-Minerals (CENTRUM SILVER PO) Take 1 tablet by mouth daily.     omega-3 acid ethyl esters (LOVAZA) 1 g capsule TAKE TWO CAPSULES BY MOUTH TWICE A DAY 360 capsule 3   omeprazole (PRILOSEC) 20 MG capsule Take 20 mg by mouth daily.     oxyCODONE-acetaminophen (PERCOCET) 10-325 MG tablet Take 1 tablet by mouth every 6 (six) hours as needed for pain. Must last 30 days. (Patient not taking: Reported on  02/13/2022) 120 tablet 0   [START ON 02/18/2022] oxyCODONE-acetaminophen (PERCOCET) 10-325 MG tablet Take 1 tablet by mouth every 6 (six) hours as needed for pain. Must last 30 days. 120 tablet 0   pramipexole (MIRAPEX) 0.125 MG tablet TAKE 1 TABLET BY MOUTH DAILY TAKE 2 TO 3 HOURS PRIOR TO BEDTIME 90 tablet 0   primidone (MYSOLINE) 50 MG tablet Take 150-250 mg by mouth 2 (two) times daily. Take 250 mg by mouth in the morning & take 150 mg at night.     promethazine-dextromethorphan (PROMETHAZINE-DM) 6.25-15 MG/5ML syrup Take 5 mLs by mouth 4 (four) times daily as needed for cough. (Patient not taking: Reported on 02/13/2022) 118 mL 0   QUEtiapine (SEROQUEL) 100 MG tablet Take 125 mg by mouth at bedtime.     rosuvastatin (CRESTOR) 40 MG tablet Take 1 tablet (40 mg total) by mouth daily. 90 tablet 3   Semaglutide, 1 MG/DOSE, (OZEMPIC, 1 MG/DOSE,) 4 MG/3ML SOPN Inject 1 mg into the skin once a week. 3 mL 1   tamsulosin (FLOMAX) 0.4 MG CAPS capsule TAKE TWO CAPSULES BY MOUTH DAILY AFTER SUPPER 180 capsule 0   traZODone (DESYREL) 100 MG tablet Take 2 tablets (200 mg total) by mouth at bedtime as needed for sleep. 180 tablet 0   varenicline (CHANTIX) 1 MG tablet Take 1/2 tablet by mouth daily for 3 days, then 1/2 tablet by mouth twice daily for 4 days, then 1 tablet twice daily thereafter. Take with food. 60 tablet 5   vitamin C (ASCORBIC ACID) 500 MG tablet Take 500 mg by mouth daily.     No current facility-administered medications for this visit.     Musculoskeletal: Strength & Muscle Tone: within normal limits Gait & Station: normal Patient leans: N/A  Psychiatric Specialty Exam: Review of Systems  There were no vitals taken for this visit.There is no height or weight on file to calculate BMI.  General Appearance: {Appearance:22683}  Eye Contact:  {BHH EYE  CONTACT:22684}  Speech:  Clear and Coherent  Volume:  Normal  Mood:  {BHH MOOD:22306}  Affect:  {Affect (PAA):22687}  Thought Process:   Coherent  Orientation:  Full (Time, Place, and Person)  Thought Content: Logical   Suicidal Thoughts:  {ST/HT (PAA):22692}  Homicidal Thoughts:  {ST/HT (PAA):22692}  Memory:  Immediate;   Good  Judgement:  {Judgement (PAA):22694}  Insight:  {Insight (PAA):22695}  Psychomotor Activity:  Normal  Concentration:  Concentration: Good and Attention Span: Good  Recall:  Good  Fund of Knowledge: Good  Language: Good  Akathisia:  No  Handed:  Right  AIMS (if indicated): not done  Assets:  Communication Skills Desire for Improvement  ADL's:  Intact  Cognition: WNL  Sleep:  {BHH GOOD/FAIR/POOR:22877}   Screenings: GAD-7    Flowsheet Row Office Visit from 12/14/2021 in [facility] [place] from 03/22/2021 in [place] from 12/07/2020 in [place] from 11/02/2020 in [place] from 09/07/2020 in [place]  Total GAD-7 Score 2 0 '3 6 5      '$ [place] from 11/15/2017 in [place] from 11/14/2016 in [facility]  Total Score (max 30 points ) 30 30      PHQ2-9    Flowsheet Row Clinical Support from 12/29/2021 in [place] Office Visit from 12/14/2021 in [facility] at [place] Visit from 11/27/2021 in [place] Visit from 11/10/2021 in [facility] Office Visit from 09/25/2021 in [place]  PHQ-2 Total Score 0 0 1 0 0  PHQ-9 Total Score 0 1 4 -- --      Flowsheet Row Counselor from 02/15/2022 in [place] Counselor from 01/03/2022 in [place] Office Visit from 09/25/2021 in  [place] No Risk No Risk No Risk        Assessment and Plan:  James Hood is a 81 y.o. year old male with a history of pseudo dementia, sleep disorder, tremor, history of QTc prolongation , who presents for follow up appointment for below.    1. Recurrent major depressive disorder, in partial remission ([place]) He denies any significant mood symptoms since the last visit.  Psychosocial stressors includes current politics, conflict with his stepson, his daughter, and demoralization due to aging/pain.  He continues to actively involve in [place], and stays connected with his family including his wife.  Will continue bupropion to target depression.     2. Insomnia, unspecified type Improving.  Will continue current dose of trazodone as needed for insomnia.  Noted that he is also on quetiapine, prescribed by his neurologist; he had a worsening in insomnia when he tried to taper down this medication.  Discussed potential risk of fall with concomitant use of trazodone, quetiapine and gabapentin.    # History of cognitive impairment Followed-up by neurologist, and has been on memantine and donepezil.      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 Acute risk factors for suicide include: N/A. Protective factors for this patient include: positive social support, responsibility to others (children, family), coping skills and hope  for the future. Considering these factors, the overall suicide risk at this point appears to be low. Patient is appropriate for outpatient follow up.     Collaboration of Care: Collaboration of Care: {BH OP Collaboration of Care:21014065}  Patient/Guardian was advised Release of Information must be obtained prior to any record release in order to collaborate their care with an outside provider. Patient/Guardian was advised if they have not already done so to contact the registration department to sign all necessary forms in order for [place] to release information regarding their care.   Consent: Patient/Guardian gives verbal consent for treatment and assignment of benefits for services provided during this visit. Patient/Guardian expressed understanding and agreed to proceed.    Norman Clay, MD 02/17/2022, 10:36 AM

## 2022-02-19 ENCOUNTER — Ambulatory Visit: Payer: PPO | Admitting: Psychiatry

## 2022-02-19 ENCOUNTER — Encounter: Payer: Self-pay | Admitting: Psychiatry

## 2022-02-19 VITALS — BP 124/67 | HR 88 | Temp 97.9°F | Wt 162.2 lb

## 2022-02-19 DIAGNOSIS — G4700 Insomnia, unspecified: Secondary | ICD-10-CM

## 2022-02-19 DIAGNOSIS — F3341 Major depressive disorder, recurrent, in partial remission: Secondary | ICD-10-CM

## 2022-02-19 MED ORDER — BUPROPION HCL ER (XL) 300 MG PO TB24: 300.0000 mg | ORAL_TABLET | Freq: Every day | ORAL | 1 refills | Status: DC

## 2022-02-19 MED ORDER — TRAZODONE HCL 100 MG PO TABS: 200.0000 mg | ORAL_TABLET | Freq: Every evening | ORAL | 0 refills | Status: DC | PRN

## 2022-02-19 NOTE — Patient Instructions (Signed)
Continue bupropion XR 300 mg daily  Continue Trazodone 200 mg at night as needed for insomnia  Next appointment: 12/11 at 8 AM

## 2022-02-20 ENCOUNTER — Encounter: Payer: Self-pay | Admitting: Student in an Organized Health Care Education/Training Program

## 2022-02-20 ENCOUNTER — Ambulatory Visit
Payer: PPO | Attending: Student in an Organized Health Care Education/Training Program | Admitting: Student in an Organized Health Care Education/Training Program

## 2022-02-20 ENCOUNTER — Encounter: Payer: PPO | Admitting: Student in an Organized Health Care Education/Training Program

## 2022-02-20 VITALS — BP 116/66 | HR 77 | Temp 97.2°F | Ht 71.0 in | Wt 163.0 lb

## 2022-02-20 DIAGNOSIS — M958 Other specified acquired deformities of musculoskeletal system: Secondary | ICD-10-CM | POA: Insufficient documentation

## 2022-02-20 DIAGNOSIS — F1190 Opioid use, unspecified, uncomplicated: Secondary | ICD-10-CM | POA: Diagnosis not present

## 2022-02-20 DIAGNOSIS — M19072 Primary osteoarthritis, left ankle and foot: Secondary | ICD-10-CM | POA: Insufficient documentation

## 2022-02-20 DIAGNOSIS — G894 Chronic pain syndrome: Secondary | ICD-10-CM | POA: Insufficient documentation

## 2022-02-20 DIAGNOSIS — M25572 Pain in left ankle and joints of left foot: Secondary | ICD-10-CM | POA: Insufficient documentation

## 2022-02-20 DIAGNOSIS — M47816 Spondylosis without myelopathy or radiculopathy, lumbar region: Secondary | ICD-10-CM | POA: Insufficient documentation

## 2022-02-20 DIAGNOSIS — M5386 Other specified dorsopathies, lumbar region: Secondary | ICD-10-CM | POA: Diagnosis not present

## 2022-02-20 DIAGNOSIS — G8929 Other chronic pain: Secondary | ICD-10-CM | POA: Diagnosis not present

## 2022-02-20 DIAGNOSIS — M5417 Radiculopathy, lumbosacral region: Secondary | ICD-10-CM | POA: Diagnosis not present

## 2022-02-20 MED ORDER — OXYCODONE-ACETAMINOPHEN 10-325 MG PO TABS: 1.0000 | ORAL_TABLET | Freq: Four times a day (QID) | ORAL | 0 refills | Status: AC | PRN

## 2022-02-20 MED ORDER — OXYCODONE-ACETAMINOPHEN 10-325 MG PO TABS: 1.0000 | ORAL_TABLET | Freq: Four times a day (QID) | ORAL | 0 refills | Status: DC | PRN

## 2022-02-20 NOTE — Progress Notes (Signed)
PROVIDER NOTE: Information contained herein reflects review and annotations entered in association with encounter. Interpretation of such information and data should be left to medically-trained personnel. Information provided to patient can be located elsewhere in the medical record under "Patient Instructions". Document created using STT-dictation technology, any transcriptional errors that may result from process are unintentional.    Patient: James Hood  Service Category: E/M  Provider: [name], MD  DOB: 03/12/1941  DOS: 02/20/2022  Specialty: Interventional Pain Management  MRN: [id number]  Setting: Ambulatory outpatient  PCP: Leone Haven, MD  Type: Established Patient    Referring Provider: Leone Haven, MD  Location: Office  Delivery: Face-to-face     HPI  James Hood, a 81 y.o. 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 James Hood's primary complain today is Ankle Pain (left)  Last encounter: My last encounter with him was on 11/28/2021  Pertinent problems: James Hood has Primary localized osteoarthritis of right knee; DJD (degenerative joint disease) of knee; Lumbar herniated disc; Chronic pain syndrome; Failed back surgical syndrome; Lumbosacral radiculopathy; Primary osteoarthritis of left ankle; Lumbar facet joint syndrome; Neuropathy; Chronic pain of left ankle; and Osteochondral defect of ankle on their pertinent problem list. Pain Assessment: Severity of Chronic pain is reported as a 6 /10. Location: Ankle Left/Denies. Onset: More than a month ago. Quality: Aching, Constant. Timing: Constant. Modifying factor(s): Nothing. Vitals:  height is 5' 11"  (1.803 m) and weight is 163 lb (73.9 kg). His temperature is 97.2 F (36.2 C) (abnormal). His blood pressure is 116/66 and his pulse is 77. His oxygen saturation is 98%.   Reason for encounter: medication management.   Presents today for medication  management.  Saw podiatry and received a left steroid injection in his ankle.  He was also referred to [facility].  His podiatrist also mentioned surgery as a last case resort if conservative measures were not helpful.  Patient continues to see psychiatry.  He is requesting an increase in his oxycodone which I advised against.  He is on high-dose opioid therapy at this time.  We have tried an opioid rotation in the past.  I discussed an opioid holiday with him which he was not interested in.  Pharmacotherapy Assessment  Analgesic:  Oxycodone 10 mg every 6 hours as needed, quantity 120/month; MME equals 60    Monitoring: [place] PMP: PDMP reviewed during this encounter.       Pharmacotherapy: No side-effects or adverse reactions reported. Compliance: No problems identified. Effectiveness: Clinically acceptable.  Chauncey Fischer, RN  02/20/2022  9:35 AM  Sign when Signing Visit Nursing Pain Medication Assessment:  Safety precautions to be maintained throughout the outpatient stay will include: orient to surroundings, keep bed in low position, maintain call bell within reach at all times, provide assistance with transfer out of bed and ambulation.  Medication Inspection Compliance: Pill count conducted under aseptic conditions, in front of the patient. Neither the pills nor the bottle was removed from the patient's sight at any time. Once count was completed pills were immediately returned to the patient in their original bottle.  Medication: Oxycodone/APAP Pill/Patch Count:  15 of 120 pills remain Pill/Patch Appearance: Markings consistent with prescribed medication Bottle Appearance: Standard pharmacy container. Clearly labeled. Filled Date: 91 / 7 / 2023 Last Medication intake:  TodaySafety precautions to be maintained throughout the outpatient stay will include: orient to surroundings, keep bed in low position, maintain call bell  within reach at all times, provide assistance with  transfer out of bed and ambulation.    UDS:  Summary  Date Value Ref Range Status  11/28/2021 Note  Final    Comment:    ==================================================================== [employer] Select 13 (MW) ==================================================================== Test                             Result       Flag       Units  Drug Present and Declared for Prescription Verification   Oxycodone                      176          EXPECTED   ng/mg creat   Oxymorphone                    732          EXPECTED   ng/mg creat   Noroxycodone                   1900         EXPECTED   ng/mg creat   Noroxymorphone                 585          EXPECTED   ng/mg creat    Sources of oxycodone are scheduled prescription medications.    Oxymorphone, noroxycodone, and noroxymorphone are expected    metabolites of oxycodone. Oxymorphone is also available as a    scheduled prescription medication.    Phenobarbital                  PRESENT      EXPECTED    Phenobarbital is an expected metabolite of primidone; Phenobarbital    may also be administered as a prescription drug.  Drug Absent but Declared for Prescription Verification   Hydromorphone                  Not Detected UNEXPECTED ng/mg creat ==================================================================== Test                      Result    Flag   Units      Ref Range   Creatinine              34               mg/dL      >=20 ==================================================================== Declared Medications:  The flagging and interpretation on this report are based on the  following declared medications.  Unexpected results may arise from  inaccuracies in the declared medications.   **Note: The testing scope of this panel includes these medications:   Hydromorphone (Dilaudid)  Oxycodone (Percocet)  Primidone (Mysoline)   **Note: The testing scope of this panel does not include the  following reported  medications:   Acetaminophen (Percocet)  Aspirin  Atropine (Lomotil)  Budesonide (Entocort)  Bupropion (Wellbutrin XL)  Cholestyramine (Questran)  Dapagliflozin (Farxiga)  Diphenoxylate (Lomotil)  Divaleproex (Depakote)  Donepezil (Aricept)  Doxycycline  Fenofibrate (TriCor)  Gabapentin (Neurontin)  Hydrochlorothiazide (Zestoretic)  Iron  Lisinopril (Zestoretic)  Memantine (Namenda)  Metformin (Glucophage)  Metoprolol (Toprol)  Multivitamin  Omega-3 Fatty Acids  Omeprazole (Prilosec)  Pramipexole (Mirapex)  Promethazine  Quetiapine (Seroquel)  Rosuvastatin (Crestor)  Semaglutide (Ozempic)  Tamsulosin (Flomax)  Trazodone (Desyrel)  Varenicline (Chantix)  Vitamin C ====================================================================  For clinical consultation, please call [phone]. ====================================================================      ROS  Constitutional: Denies any fever or chills Gastrointestinal: No reported hemesis, hematochezia, vomiting, or acute GI distress Musculoskeletal: Low back pain, left ankle pain Neurological: No reported episodes of acute onset apraxia, aphasia, dysarthria, agnosia, amnesia, paralysis, loss of coordination, or loss of consciousness  Medication Review  FreeStyle Libre 14 Day Sensor, Multiple Vitamins-Minerals, QUEtiapine, Semaglutide (1 MG/DOSE), ascorbic acid, aspirin EC, buPROPion, budesonide, cholestyramine, dapagliflozin propanediol, diphenoxylate-atropine, divalproex, donepezil, fenofibrate, ferrous sulfate, gabapentin, lisinopril-hydrochlorothiazide, memantine, metFORMIN, metoprolol succinate, omega-3 acid ethyl esters, omeprazole, oxyCODONE-acetaminophen, pramipexole, primidone, promethazine-dextromethorphan, rosuvastatin, tamsulosin, and traZODone  History Review  Allergy: James Hood has No Known Allergies. Drug: James Hood  reports no history of drug use. Alcohol:  reports that he does not currently  use alcohol. Tobacco:  reports that he has been smoking cigarettes. He has a 30.00 pack-year smoking history. He has never used smokeless tobacco. Social: James Hood  reports that he has been smoking cigarettes. He has a 30.00 pack-year smoking history. He has never used smokeless tobacco. He reports that he does not currently use alcohol. He reports that he does not use drugs. 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 Family: family history includes Depression in his father; Heart attack in his mother; Heart disease in his mother; Hypertension in his father.  Laboratory Chemistry Profile   Renal Lab Results  Component Value Date   BUN 31 (H) 02/13/2022   CREATININE 1.33 02/13/2022   GFR 49.94 (L) 02/13/2022   GFRAA >60 09/25/2019   GFRNONAA 55 (L) 06/08/2020    Hepatic Lab Results  Component Value Date   AST 21  09/19/2021   ALT 14 09/19/2021   ALBUMIN 4.3 09/19/2021   ALKPHOS 46 09/19/2021   AMMONIA 14 08/09/2016    Electrolytes Lab Results  Component Value Date   NA 137 02/13/2022   K 3.9 02/13/2022   CL 99 02/13/2022   CALCIUM 9.9 02/13/2022   MG 1.4 (L) 09/08/2020    Bone Lab Results  Component Value Date   VD25OH 37.09 11/10/2021    Inflammation (CRP: Acute Phase) (ESR: Chronic Phase) Lab Results  Component Value Date   ESRSEDRATE 15 07/20/2019   LATICACIDVEN 1.9 06/08/2020         Note: Above Lab results reviewed.   CLINICAL DATA:  Left sciatic pain for 2 years.  Prior back surgery.   EXAM: MRI LUMBAR SPINE WITHOUT CONTRAST   TECHNIQUE: Multiplanar, multisequence MR imaging of the lumbar spine was performed. No intravenous contrast was administered.   COMPARISON:  03/29/2016   FINDINGS: Segmentation:  Standard.   Alignment:  Physiologic.   Vertebrae:  No fracture, evidence of discitis, or bone lesion.   Conus medullaris  and cauda equina: Conus extends to the T12-L1 level. Conus and cauda equina appear normal.   Paraspinal and other soft tissues: No paraspinal abnormality.   Disc levels:   Disc spaces: Degenerative disc disease mild disc height loss throughout the lumbar spine.   T12-L1: No significant disc bulge. No evidence of neural foraminal stenosis. No central canal stenosis.   L1-L2: No significant disc bulge. No evidence of neural foraminal stenosis. No central canal stenosis.   L2-L3: No significant disc bulge. No evidence of neural foraminal stenosis. No central canal stenosis.   L3-L4: Mild broad-based disc bulge. Mild bilateral facet arthropathy. Moderate right foraminal stenosis. No central canal stenosis.   L4-L5: Mild broad-based disc bulge. Severe bilateral facet arthropathy. Bilateral lateral recess stenosis. Mild bilateral foraminal stenosis. No central canal stenosis.   L5-S1: Broad-based disc bulge with a small central disc  protrusion. Moderate bilateral facet arthropathy. Mild left foraminal stenosis. No central canal stenosis.   IMPRESSION: 1. At L3-4 there is a mild broad-based disc bulge. Mild bilateral facet arthropathy. Moderate right foraminal stenosis. 2. At L4-5 there is a mild broad-based disc bulge. Severe bilateral facet arthropathy. Bilateral lateral recess stenosis. Mild bilateral foraminal stenosis. 3. At L5-S1 there is a broad-based disc bulge with a small central disc protrusion. Moderate bilateral facet arthropathy. Mild left foraminal stenosis.     Electronically Signed   By: Kathreen Devoid   On: 04/19/2017 10:33  Physical Exam  General appearance: Well nourished, well developed, and well hydrated. In no apparent acute distress Mental status: Alert, oriented x 3 (person, place, & time)       Respiratory: No evidence of acute respiratory distress Eyes: PERLA Vitals: BP 116/66   Pulse 77   Temp (!) 97.2 F (36.2 C)   Ht 5' 11"  (1.803 m)   Wt 163 lb (73.9 kg)   SpO2 98%   BMI 22.73 kg/m  BMI: Estimated body mass index is 22.73 kg/m as calculated from the following:   Height as of this encounter: 5' 11"  (1.803 m).   Weight as of this encounter: 163 lb (73.9 kg). Ideal: Ideal body weight: 75.3 kg (166 lb 0.1 oz)  Cervical Spine Area Exam  Skin & Axial Inspection: No masses, redness, edema, swelling, or associated skin lesions Alignment: Symmetrical Functional ROM: Unrestricted ROM      Stability: No instability detected Muscle Tone/Strength: Functionally intact. No obvious neuro-muscular anomalies detected. Sensory (Neurological): Unimpaired Palpation: No palpable anomalies              Lumbar Spine Area Exam  Skin & Axial Inspection: No masses, redness, or swelling Alignment: Symmetrical Functional ROM: Pain restricted ROM       Stability: No instability detected Muscle Tone/Strength: Functionally intact. No obvious neuro-muscular anomalies detected. Sensory  (Neurological): Musculoskeletal pain pattern articular, facet mediated Palpation: No palpable anomalies        Gait & Posture Assessment  Ambulation: Unassisted Gait: Relatively normal for age and body habitus Posture: WNL  Lower Extremity Exam    Side: Right lower extremity  Side: Left lower extremity  Stability: No instability observed          Stability: No instability observed          Skin & Extremity Inspection: Skin color, temperature, and hair growth are WNL. No peripheral edema or cyanosis. No masses, redness, swelling, asymmetry, or associated skin lesions. No contractures.  Skin & Extremity Inspection: Skin color, temperature, and hair growth are WNL. No peripheral edema or  cyanosis. No masses, redness, swelling, asymmetry, or associated skin lesions. No contractures.  Functional ROM: Unrestricted ROM                  Functional ROM: Pain restricted ROM  left ankle          Muscle Tone/Strength: Functionally intact. No obvious neuro-muscular anomalies detected.  Muscle Tone/Strength: Functionally intact. No obvious neuro-muscular anomalies detected.  Sensory (Neurological): Unimpaired        Sensory (Neurological): Arthropathic arthralgia ankle        DTR: Patellar: deferred today Achilles: deferred today Plantar: deferred today  DTR: Patellar: deferred today Achilles: deferred today Plantar: deferred today  Palpation: No palpable anomalies  Palpation: No palpable anomalies    Assessment   Diagnosis  1. Primary osteoarthritis of left ankle   2. Chronic pain of left ankle   3. Osteochondral defect of ankle (left)   4. Lumbar facet joint syndrome   5. Facet arthritis of lumbar region   6. Lumbosacral radiculopathy   7. Sciatica of left side associated with disorder of lumbar spine   8. Chronic pain syndrome   9. Chronic, continuous use of opioids       Plan of Care   [name] has a current medication list which includes the following long-term  medication(s): [START ON 02/26/2022] bupropion, cholestyramine, donepezil, fenofibrate, gabapentin, gabapentin, lisinopril-hydrochlorothiazide, memantine, metformin, metoprolol succinate, omega-3 acid ethyl esters, omeprazole, pramipexole, primidone, quetiapine, rosuvastatin, and [START ON 04/25/2022] trazodone.   Meds ordered this encounter  Medications   oxyCODONE-acetaminophen (PERCOCET) 10-325 MG tablet    Sig: Take 1 tablet by mouth every 6 (six) hours as needed for pain. Must last 30 days.    Dispense:  120 tablet    Refill:  0    Chronic Pain: STOP Act (Not applicable) Fill 1 day early if closed on refill date. Avoid benzodiazepines within 8 hours of opioids   oxyCODONE-acetaminophen (PERCOCET) 10-325 MG tablet    Sig: Take 1 tablet by mouth every 6 (six) hours as needed for pain. Must last 30 days.    Dispense:  120 tablet    Refill:  0    Chronic Pain: STOP Act (Not applicable) Fill 1 day early if closed on refill date. Avoid benzodiazepines within 8 hours of opioids   oxyCODONE-acetaminophen (PERCOCET) 10-325 MG tablet    Sig: Take 1 tablet by mouth every 6 (six) hours as needed for pain. Must last 30 days.    Dispense:  120 tablet    Refill:  0    Chronic Pain: STOP Act (Not applicable) Fill 1 day early if closed on refill date. Avoid benzodiazepines within 8 hours of opioids   Continue with psychiatric care    Follow-up plan:   Return in about 3 months (around 05/23/2022) for Medication Management, in person.    Recent Visits Date Type Provider Dept  11/28/21 Office Visit [name], MD [facility]  Showing recent visits within past 90 days and meeting all other requirements Today's Visits Date Type Provider Dept  02/20/22 Office Visit [name], MD [facility]  Showing today's visits and meeting all other requirements Future Appointments Date Type Provider Dept  05/17/22 Appointment [name], MD [facility]  Showing future  appointments within next 90 days and meeting all other requirements  I discussed the assessment and treatment plan with the patient. The patient was provided an opportunity to ask questions and all were answered. The patient agreed  with the plan and demonstrated an understanding of the instructions.  Patient advised to call back or seek an in-person evaluation if the symptoms or condition worsens.  Duration of encounter: 30 minutes.  Note by: [name], MD Date: 02/20/2022; Time: 10:37 AM

## 2022-02-20 NOTE — Progress Notes (Signed)
Nursing Pain Medication Assessment:  Safety precautions to be maintained throughout the outpatient stay will include: orient to surroundings, keep bed in low position, maintain call bell within reach at all times, provide assistance with transfer out of bed and ambulation.  Medication Inspection Compliance: Pill count conducted under aseptic conditions, in front of the patient. Neither the pills nor the bottle was removed from the patient's sight at any time. Once count was completed pills were immediately returned to the patient in their original bottle.  Medication: Oxycodone/APAP Pill/Patch Count:  15 of 120 pills remain Pill/Patch Appearance: Markings consistent with prescribed medication Bottle Appearance: Standard pharmacy container. Clearly labeled. Filled Date: 42 / 7 / 2023 Last Medication intake:  TodaySafety precautions to be maintained throughout the outpatient stay will include: orient to surroundings, keep bed in low position, maintain call bell within reach at all times, provide assistance with transfer out of bed and ambulation.

## 2022-02-27 ENCOUNTER — Other Ambulatory Visit: Payer: Self-pay

## 2022-02-27 ENCOUNTER — Telehealth: Payer: Self-pay | Admitting: Family Medicine

## 2022-02-27 DIAGNOSIS — E1142 Type 2 diabetes mellitus with diabetic polyneuropathy: Secondary | ICD-10-CM

## 2022-02-27 MED ORDER — FREESTYLE LIBRE 14 DAY SENSOR MISC: 5 refills | Status: DC

## 2022-02-27 NOTE — Telephone Encounter (Signed)
Sensors sent to pharmacy.  Elsey Holts,cma

## 2022-02-27 NOTE — Telephone Encounter (Signed)
Pt need a refill on libre sensor sent to [facility]

## 2022-03-05 ENCOUNTER — Other Ambulatory Visit: Payer: Self-pay | Admitting: Family Medicine

## 2022-03-14 ENCOUNTER — Ambulatory Visit: Payer: PPO | Admitting: Podiatry

## 2022-03-14 DIAGNOSIS — M19072 Primary osteoarthritis, left ankle and foot: Secondary | ICD-10-CM

## 2022-03-14 NOTE — Progress Notes (Signed)
  Subjective:  Patient ID: James Hood, male    DOB: June 23, 1940,  MRN: [id number]  Chief Complaint  Patient presents with   Arthritis    Follow up left foot -not any better    81 y.o. male presents with the above complaint. History confirmed with patient.  He said the injection at the last visit helped for couple weeks and then started to return.  He was fitted for his brace and it is due for pickup next week  Objective:  Physical Exam: warm, good capillary refill, no trophic changes or ulcerative lesions, normal DP and PT pulses, normal sensory exam, and he has limited range of motion of the hindfoot and ankle, he does not have any pain to palpation of the anterior ankle joint line or with range of motion of the ankle itself.  Minimal in the subtalar joint.  He does have sharp severe pain over the anterior process of calcaneus and the calcaneocuboid joint.      IMPRESSION: 1. 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   2.  Ligaments and tendons are intact.   3. 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 Subtalar joints are unremarkable.   4. Mild subcutaneous soft tissue edema. No fluid collection or hematoma. No evidence of muscle strain.     Electronically Signed   By: Keane Police D.O.   On: 02/05/2022 22:43 Assessment:   1. Primary osteoarthritis of left foot      Plan:  Patient was evaluated and treated and all questions answered.  Had slight improvement with injection in the calcaneocuboid joint.  I do think this is still the primary area of interest and pain with arthritic changes.  I again reviewed his MRI images.  We discussed the option of a CT scan to follow-up this for surgical planning if we are seriously considering this.  I do think bracing will be beneficial and he picks up his brace next week.  I discussed with him I would give this time and allow this to adjust to use  of the brace for the next 3 to 4 weeks once he has it, if he does not have any improvement following this he will let me know in early December if he would like to proceed with a CT scan and consideration of surgical planning.  Return if symptoms worsen or fail to improve.

## 2022-03-20 DIAGNOSIS — Z6823 Body mass index (BMI) 23.0-23.9, adult: Secondary | ICD-10-CM | POA: Diagnosis not present

## 2022-03-20 DIAGNOSIS — N1831 Chronic kidney disease, stage 3a: Secondary | ICD-10-CM | POA: Diagnosis not present

## 2022-03-20 DIAGNOSIS — E1142 Type 2 diabetes mellitus with diabetic polyneuropathy: Secondary | ICD-10-CM | POA: Diagnosis not present

## 2022-03-20 DIAGNOSIS — E1122 Type 2 diabetes mellitus with diabetic chronic kidney disease: Secondary | ICD-10-CM | POA: Diagnosis not present

## 2022-03-20 DIAGNOSIS — M79605 Pain in left leg: Secondary | ICD-10-CM | POA: Diagnosis not present

## 2022-03-20 DIAGNOSIS — G250 Essential tremor: Secondary | ICD-10-CM | POA: Diagnosis not present

## 2022-03-20 DIAGNOSIS — Z7985 Long-term (current) use of injectable non-insulin antidiabetic drugs: Secondary | ICD-10-CM | POA: Diagnosis not present

## 2022-03-20 DIAGNOSIS — G8929 Other chronic pain: Secondary | ICD-10-CM | POA: Diagnosis not present

## 2022-03-20 DIAGNOSIS — Z7984 Long term (current) use of oral hypoglycemic drugs: Secondary | ICD-10-CM | POA: Diagnosis not present

## 2022-03-22 NOTE — Telephone Encounter (Signed)
Close encounter 

## 2022-03-23 ENCOUNTER — Telehealth: Payer: Self-pay | Admitting: Pharmacist

## 2022-03-23 NOTE — Progress Notes (Signed)
[facility])  [place] [facility]    03/23/2022  BRAXTIN BAMBA 12/08/1940 [id number]  Reason for referral: Medication Assistance  Referral source:  2024 Re-Enrollment Franne Grip  Current insurance: [employer]  Outreach:  Successful telephone call with Mr. Sevin Langenbach.  HIPAA identifiers verified.   Objective: The ASCVD Risk score (Arnett DK, et al., 2019) failed to calculate for the following reasons:   The 2019 ASCVD risk score is only valid for ages 87 to 54  Lab Results  Component Value Date   CREATININE 1.33 02/13/2022   CREATININE 1.30 08/02/2021   CREATININE 1.17 05/10/2021    Lab Results  Component Value Date   HGBA1C 7.0 (H) 02/13/2022    Lipid Panel     Component Value Date/Time   CHOL 186 08/02/2021 1400   TRIG 304.0 (H) 08/02/2021 1400   HDL 59.10 08/02/2021 1400   CHOLHDL 3 08/02/2021 1400   VLDL 60.8 (H) 08/02/2021 1400   LDLCALC 57 09/25/2017 0924   LDLDIRECT 35.0 09/19/2021 1015    BP Readings from Last 3 Encounters:  02/20/22 116/66  02/19/22 124/67  02/13/22 118/60    No Known Allergies  Assessment: Reviewed re-enrollment process with Mr. Borum for [place], notified patient of need for proof of 2023 house hold income. Mailing address confirmed, no changes in income or household size per patient.   Medication Assistance Findings:  Medication assistance needs identified: [name], Ozempic  Extra Help:  Not eligible for Extra Help Low Income Subsidy based on reported income and assets  Patient Assistance Programs: [name] made by [employer] requirement met: Yes Out-of-pocket prescription expenditure met:   Not Applicable Patient has met application requirements to apply for this program.    Ozempic made by [employer] requirement met: Yes Out-of-pocket prescription expenditure met:   Not Applicable Patient has met application requirements to apply for this  program.   Plan: I will route patient assistance letter to [place] technician who will coordinate patient assistance program application process for medications listed above.  [facility] pharmacy technician will assist with obtaining all required documents from both patient and provider(s) and submit application(s) once completed.   Loretha Brasil, PharmD [place] Pharmacist Office: [phone]

## 2022-03-26 ENCOUNTER — Telehealth: Payer: Self-pay | Admitting: Pharmacy Technician

## 2022-03-26 DIAGNOSIS — Z596 Low income: Secondary | ICD-10-CM

## 2022-03-26 NOTE — Progress Notes (Signed)
[name])                                            [facility] Team    03/26/2022  James Hood 05-01-1941 [id number]                                      Medication Assistance Referral-FOR 2024 RE ENROLLMENT  Referral From: [place]  Medication/Company: [name] / [employer] Patient application portion:  Education officer, museum portion: Faxed  to Dr. Tommi Rumps Provider address/fax verified via: Office website  Medication/Company: [name] / [employer] Patient application portion:  Mailed Provider application portion: Faxed  to Dr. Tommi Rumps Provider address/fax verified via: Office website   [name] P. Twanna Resh, [place]  [phone]

## 2022-03-28 ENCOUNTER — Other Ambulatory Visit: Payer: Self-pay

## 2022-03-28 ENCOUNTER — Ambulatory Visit (INDEPENDENT_AMBULATORY_CARE_PROVIDER_SITE_OTHER): Payer: PPO | Admitting: Licensed Clinical Social Worker

## 2022-03-28 DIAGNOSIS — F3341 Major depressive disorder, recurrent, in partial remission: Secondary | ICD-10-CM | POA: Diagnosis not present

## 2022-03-28 DIAGNOSIS — E1142 Type 2 diabetes mellitus with diabetic polyneuropathy: Secondary | ICD-10-CM

## 2022-03-28 MED ORDER — OZEMPIC (1 MG/DOSE) 4 MG/3ML ~~LOC~~ SOPN: 1.0000 mg | PEN_INJECTOR | SUBCUTANEOUS | 3 refills | Status: DC

## 2022-03-28 MED ORDER — DAPAGLIFLOZIN PROPANEDIOL 10 MG PO TABS: 10.0000 mg | ORAL_TABLET | Freq: Every day | ORAL | 3 refills | Status: DC

## 2022-03-28 NOTE — Progress Notes (Signed)
Virtual Visit via Audio Note  I connected with James Hood on 03/28/22 at  9:00 AM EST by an audio enabled telemedicine application and verified that I am speaking with the correct person using two identifiers.  Location: Patient: home Provider: [place] Office   I discussed the limitations of evaluation and management by telemedicine and the availability of in person appointments. The patient expressed understanding and agreed to proceed.   I discussed the assessment and treatment plan with the patient. The patient was provided an opportunity to ask questions and all were answered. The patient agreed with the plan and demonstrated an understanding of the instructions.   The patient was advised to call back or seek an in-person evaluation if the symptoms worsen or if the condition fails to improve as anticipated.  I provided 45 minutes of non-face-to-face time during this encounter.   Adelynn Gipe R Chezney Huether, LCSW   THERAPIST PROGRESS NOTE  Session Time: 915-10a  Participation Level: Active  Behavioral Response: Neat and Well GroomedAlertAnxious  Type of Therapy: Individual Therapy  Treatment Goals addressed: Problem: Decrease depressive symptoms and improve levels of effective functioning Goal: LTG: Reduce frequency, intensity, and duration of depression symptoms as evidenced by: pt self report Outcome: Progressing Goal: STG: '@PREFFIRSTNAME'$ @ will participate in at least 80% of scheduled individual psychotherapy sessions Outcome: Progressing Intervention: Encourage verbalization of feelings/concerns/expectations Note: Explored/allowed pt to express.  Intervention: Encourage patient to identify triggers Note: Reviewed/identified triggers Intervention: Discuss self-management skills Note: Explored emotion regulation tools  ProgressTowards Goals: Progressing  Interventions: CBT  Summary: James Hood is a 81 y.o. male who presents with  improving symptoms related to his depression diagnosis. Pt reports overall mood is stable and that he is managing situational stressors better.   Allowed pt to explore and express thoughts and feelings associated with recent life situations and external stressors.  Continued recommendations are as follows: self care behaviors, positive social engagements, focusing on overall work/home/life balance, and focusing on positive physical and emotional wellness.  Suicidal/Homicidal: No  Therapist Response: Pt is continuing to apply interventions learned in session into daily life situations. Pt is currently on track to meet goals utilizing interventions mentioned above. Personal growth and progress noted. Treatment to continue as indicated.   Plan: Return again in 4 weeks.  Diagnosis:  Encounter Diagnosis  Name Primary?   MDD (major depressive disorder), recurrent, in partial remission ([place]) Yes   Collaboration of Care: Other Pt encouraged to continue psychiatric care with Dr. Norman Clay  Patient/Guardian was advised Release of Information must be obtained prior to any record release in order to collaborate their care with an outside provider. Patient/Guardian was advised if they have not already done so to contact the registration department to sign all necessary forms in order for [place] to release information regarding their care.   Consent: Patient/Guardian gives verbal consent for treatment and assignment of benefits for services provided during this visit. Patient/Guardian expressed understanding and agreed to proceed.   [place], LCSW 03/28/2022

## 2022-03-29 DIAGNOSIS — M19072 Primary osteoarthritis, left ankle and foot: Secondary | ICD-10-CM | POA: Diagnosis not present

## 2022-03-29 NOTE — Plan of Care (Signed)
  Problem: Decrease depressive symptoms and improve levels of effective functioning Goal: LTG: Reduce frequency, intensity, and duration of depression symptoms as evidenced by: pt self report Outcome: Progressing Goal: STG: '@PREFFIRSTNAME'$ @ will participate in at least 80% of scheduled individual psychotherapy sessions Outcome: Progressing Intervention: Encourage verbalization of feelings/concerns/expectations Note: Allowed pt to explore/express Intervention: Encourage patient to identify triggers Note: Encouraged identification Intervention: Discuss self-management skills Note: Reviewed

## 2022-04-24 DIAGNOSIS — R262 Difficulty in walking, not elsewhere classified: Secondary | ICD-10-CM | POA: Diagnosis not present

## 2022-04-24 DIAGNOSIS — R4189 Other symptoms and signs involving cognitive functions and awareness: Secondary | ICD-10-CM | POA: Diagnosis not present

## 2022-04-24 DIAGNOSIS — G2581 Restless legs syndrome: Secondary | ICD-10-CM | POA: Diagnosis not present

## 2022-04-24 DIAGNOSIS — R413 Other amnesia: Secondary | ICD-10-CM | POA: Diagnosis not present

## 2022-04-24 DIAGNOSIS — G479 Sleep disorder, unspecified: Secondary | ICD-10-CM | POA: Diagnosis not present

## 2022-04-26 NOTE — Progress Notes (Addendum)
James Hood OP Progress Note  04/30/2022 8:40 AM James Hood  MRN:  [id number]  Chief Complaint:  Chief Complaint  Patient presents with   Follow-up   HPI:  This is a follow-up appointment for depression and insomnia.  He states that he is worried about financial strain.  He is concerned that if he were to predecease her.  He has made arrangement to be prepared for this.  He reports good time with his wife.  She is working on garden, and he sat there at [employer].  Although he is unable to go to places due to financial strain and worsening in pain, he tries not to stay on it as it is not productive.  He reduced quetiapine, taking 75 mg total as he was feeling drowsy.  It has been better since then.  He fell once a few days ago after having tripped on the last step.  He denies head injury.  He agrees to discuss with his primary care if this were to be worsening.  He sleeps well. The patient has mood symptoms as in PHQ-9/GAD-7.  He denies SI.  He has not drank alcohol for 37 years.  He goes to [employer] meeting 5 days a week, and he is sponsoring 7 people.  He denies drug use.  He feels comfortable to stay at the current medication regimen.   Wt Readings from Last 3 Encounters:  04/30/22 162 lb 3.2 oz (73.6 kg)  02/20/22 163 lb (73.9 kg)  02/19/22 162 lb 3.2 oz (73.6 kg)    Visit Diagnosis:    ICD-10-CM   1. MDD (major depressive disorder), recurrent, in partial remission ([place])  F33.41     2. Insomnia, unspecified type  G47.00       Past Psychiatric History: Please see initial evaluation for full details. I have reviewed the history. No updates at this time.     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   Colon polyps    Dementia ([place])    Depression    Diabetes mellitus type 2 in nonobese (HCC)    GERD  (gastroesophageal reflux disease)    BARRETTS ESOPHAGUS RESOLVED PER PATIENT   Headache    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[place])    History of hiatal hernia    Hypercholesteremia    Hypertension    Hyperthyroidism    Neuropathic pain    Primary localized osteoarthritis of right knee    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   Stomach ulcer    Tremor, essential    Wound infection complicating hardware ([place]) 04/02/2018    Past Surgical History:  Procedure Laterality Date   ANKLE ARTHROSCOPY Left 09/29/2019   Procedure: LEFT ANKLE ARTHROSCOPY, DEBRIDEMENT;  Surgeon: Newt Minion, MD;  Location: [place];  Service: Orthopedics;  Laterality: Left;   BACK SURGERY     COLONOSCOPY WITH PROPOFOL N/A 09/14/2016   Procedure: COLONOSCOPY WITH PROPOFOL;  Surgeon: Manya Silvas, MD;  Location[facility] ENDOSCOPY;  Service: Endoscopy;  Laterality: N/A;   esophageal stretch     ESOPHAGOGASTRODUODENOSCOPY (EGD) WITH PROPOFOL N/A 12/13/2020   Procedure: ESOPHAGOGASTRODUODENOSCOPY (EGD) WITH PROPOFOL;  Surgeon: Lesly Rubenstein, MD;  Location: [facility] ENDOSCOPY;  Service: Endoscopy;  Laterality: N/A;  IDDM   JOINT REPLACEMENT Right  2016   knee   LUMBAR LAMINECTOMY/DECOMPRESSION MICRODISCECTOMY Left 02/03/2016   Procedure: LEFT L5-S1 DISKECTOMY;  Surgeon: Leeroy Cha, MD;  Location: [facility] NEURO ORS;  Service: Neurosurgery;  Laterality: Left;  LEFT L5-S1 DISKECTOMY   PULSE GENERATOR IMPLANT N/A 08/21/2017   Procedure: UNILATERAL PULSE GENERATOR IMPLANT;  Surgeon: Meade Maw, MD;  Location: [facility] ORS;  Service: Neurosurgery;  Laterality: N/A;   PULSE GENERATOR IMPLANT Right 04/02/2018   Procedure: REMOVAL OF PULSE GENERATOR IMPLANT AND LEADS;  Surgeon: Meade Maw, MD;  Location: [facility] ORS;  Service: Neurosurgery;  Laterality: Right;   TONSILLECTOMY     TOTAL KNEE ARTHROPLASTY Right 11/15/2014   Procedure: TOTAL KNEE ARTHROPLASTY;  Surgeon: Elsie Saas, MD;  Location: [place];   Service: Orthopedics;  Laterality: Right;    Family Psychiatric History: Please see initial evaluation for full details. I have reviewed the history. No updates at this time.     Family History:  Family History  Problem Relation Age of Onset   Heart attack Mother    Heart disease Mother    Hypertension Father    Depression Father    Kidney cancer Neg Hx    Prostate cancer Neg Hx     Social History:  Social History   Socioeconomic History   Marital status: Married    Spouse name: Not on file   Number of children: Not on file   Years of education: Not on file   Highest education level: Not on file  Occupational History   Not on file  Tobacco Use   Smoking status: Every Day    Packs/day: 0.50    Years: 60.00    Total pack years: 30.00    Types: Cigarettes   Smokeless tobacco: Never  Vaping Use   Vaping Use: Former  Substance and Sexual Activity   Alcohol use: Not Currently    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   Drug use: Never   Sexual activity: Not Currently  Other Topics Concern   Not on file  Social History Narrative   Not on file   Social Determinants of Health   Financial Resource Strain: Low Risk  (12/29/2021)   Overall Financial Resource Strain (CARDIA)    Difficulty of Paying Living Expenses: Not hard at all  Food Insecurity: No Food Insecurity (12/29/2021)   Hunger Vital Sign    Worried About Running Out of Food in the Last Year: Never true    [place] in the Last Year: Never true  Transportation Needs: No Transportation Needs (12/29/2021)   PRAPARE - Hydrologist (Medical): No    Lack of Transportation (Non-Medical): No  Physical Activity: Insufficiently Active (12/30/2019)   Exercise Vital Sign    Days of Exercise per Week: 3 days    Minutes of Exercise per Session: 40 min  Stress: No Stress Concern Present (12/29/2021)   [place]     Feeling of Stress : Not at all  Social Connections: Unknown (12/29/2021)   Social Connection and Isolation Panel [NHANES]    Frequency of Communication with Friends and Family: Not on file    Frequency of Social Gatherings with Friends and Family: Not on file    Attends Religious Services: Not on file    Active Member of Clubs or Organizations: Not on file    Attends Archivist Meetings: Not on file    Marital Status: Married    Allergies:  No Known Allergies  Metabolic Disorder Labs: Lab Results  Component Value Date   HGBA1C 7.0 (H) 02/13/2022   MPG 131 01/25/2016   MPG 166 11/05/2014   No results found for: "PROLACTIN" Lab Results  Component Value Date   CHOL 186 08/02/2021   TRIG 304.0 (H) 08/02/2021   HDL 59.10 08/02/2021   CHOLHDL 3 08/02/2021   VLDL 60.8 (H) 08/02/2021   LDLCALC 57 09/25/2017   Lab Results  Component Value Date   TSH 1.08 08/02/2021   TSH 0.97 10/11/2020    Therapeutic Level Labs: No results found for: "LITHIUM" No results found for: "VALPROATE" No results found for: "CBMZ"  Current Medications: Current Outpatient Medications  Medication Sig Dispense Refill   aspirin EC 81 MG tablet Take 81 mg by mouth daily.     budesonide (ENTOCORT EC) 3 MG 24 hr capsule Take 2 capsules (6 mg total) by mouth daily. 180 capsule 0   buPROPion (WELLBUTRIN XL) 300 MG 24 hr tablet Take 1 tablet (300 mg total) by mouth daily. 90 tablet 1   cholestyramine (QUESTRAN) 4 g packet Take 1 packet by mouth 2 (two) times daily.     Continuous Blood Gluc Sensor (FREESTYLE LIBRE 14 DAY SENSOR) MISC APPLY 1 SENSOR EVERY 14 DAYS TO MONITOR BLOOD GLUCOSE LEVELS. REMOVE OLD SENSOR BEFORE APPLYING NEW SENSOR 2 each 1   Continuous Blood Gluc Sensor (FREESTYLE LIBRE 14 DAY SENSOR) MISC APPLY 1 SENSOR EVERY 14 DAYS TO MONITOR BLOOD GLUCOSE LEVELS. REMOVE OLD SENSOR BEFORE APPLYING NEW SENSOR 2 each 5   dapagliflozin propanediol (FARXIGA) 10 MG TABS tablet Take 1 tablet (10  mg total) by mouth daily. 90 tablet 3   diphenoxylate-atropine (LOMOTIL) 2.5-0.025 MG tablet Take by mouth.     divalproex (DEPAKOTE ER) 500 MG 24 hr tablet Take 1 tablet by mouth 2 (two) times daily.     donepezil (ARICEPT) 10 MG tablet Take 10 mg by mouth at bedtime.     fenofibrate (TRICOR) 145 MG tablet Take 1 tablet (145 mg total) by mouth daily. 90 tablet 1   ferrous sulfate 325 (65 FE) MG EC tablet Take 325 mg by mouth daily.     gabapentin (NEURONTIN) 600 MG tablet TAKE TWO TABLETS BY MOUTH THREE TIMES A DAY 180 tablet 1   gabapentin (NEURONTIN) 600 MG tablet Take by mouth.     lisinopril-hydrochlorothiazide (ZESTORETIC) 20-25 MG tablet TAKE ONE TABLET BY MOUTH TWICE A DAY 180 tablet 1   memantine (NAMENDA) 10 MG tablet Take 1 tablet by mouth daily.     metFORMIN (GLUCOPHAGE-XR) 500 MG 24 hr tablet TAKE ONE TABLET BY MOUTH TWICE A DAY 180 tablet 1   metoprolol succinate (TOPROL-XL) 25 MG 24 hr tablet Take 25 mg by mouth daily.     Multiple Vitamins-Minerals (CENTRUM SILVER PO) Take 1 tablet by mouth daily.     omega-3 acid ethyl esters (LOVAZA) 1 g capsule TAKE TWO CAPSULES BY MOUTH TWICE A DAY 360 capsule 3   omeprazole (PRILOSEC) 20 MG capsule Take 20 mg by mouth daily.     oxyCODONE-acetaminophen (PERCOCET) 10-325 MG tablet Take 1 tablet by mouth every 6 (six) hours as needed for pain. Must last 30 days. 120 tablet 0   pramipexole (MIRAPEX) 0.125 MG tablet TAKE 1 TABLET BY MOUTH DAILY TAKE 2 TO 3 HOURS PRIOR TO BEDTIME 90 tablet 0   primidone (MYSOLINE) 50 MG tablet Take 150-250 mg by mouth 2 (two) times daily. Take 250 mg by mouth  in the morning & take 150 mg at night.     promethazine-dextromethorphan (PROMETHAZINE-DM) 6.25-15 MG/5ML syrup Take 5 mLs by mouth 4 (four) times daily as needed for cough. 118 mL 0   QUEtiapine (SEROQUEL) 100 MG tablet Take 125 mg by mouth at bedtime.     rosuvastatin (CRESTOR) 40 MG tablet Take 1 tablet (40 mg total) by mouth daily. 90 tablet 3    Semaglutide, 1 MG/DOSE, (OZEMPIC, 1 MG/DOSE,) 4 MG/3ML SOPN Inject 1 mg into the skin once a week. 3 mL 3   tamsulosin (FLOMAX) 0.4 MG CAPS capsule TAKE TWO CAPSULES BY MOUTH DAILY AFTER SUPPER 180 capsule 0   traZODone (DESYREL) 100 MG tablet Take 2 tablets (200 mg total) by mouth at bedtime as needed for sleep. 180 tablet 0   vitamin C (ASCORBIC ACID) 500 MG tablet Take 500 mg by mouth daily.     No current facility-administered medications for this visit.     Musculoskeletal: Strength & Muscle Tone: within normal limits Gait & Station: normal- using a cane Patient leans: N/A  Psychiatric Specialty Exam: Review of Systems  Psychiatric/Behavioral: Negative.    All other systems reviewed and are negative.   Blood pressure 132/76, pulse 88, temperature (!) 97.5 F (36.4 C), temperature source Oral, height '5\' 11"'$  (1.803 m), weight 162 lb 3.2 oz (73.6 kg).Body mass index is 22.62 kg/m.  General Appearance: Fairly Groomed  Eye Contact:  Good  Speech:  Clear and Coherent  Volume:  Normal  Mood:   worried  Affect:  Appropriate, Congruent, and Full Range  Thought Process:  Coherent  Orientation:  Full (Time, Place, and Person)  Thought Content: Logical   Suicidal Thoughts:  No  Homicidal Thoughts:  No  Memory:  Immediate;   Good  Judgement:  Good  Insight:  Good  Psychomotor Activity:  Normal +postural tremors on bilateral hands, no rigidity, no tardive dyskinesia, normal tone  Concentration:  Concentration: Good and Attention Span: Good  Recall:  Good  Fund of Knowledge: Good  Language: Good  Akathisia:  No  Handed:  Right  AIMS (if indicated): not done  Assets:  Communication Skills Desire for Improvement  ADL's:  Intact  Cognition: WNL  Sleep:  Good   Screenings: GAD-7    Flowsheet Row Office Visit from 04/30/2022 in [place] Office Visit from 02/19/2022 in [place] Office Visit from 12/14/2021 in [facility] at [place] from 03/22/2021 in [place] from 12/07/2020 in [place]  Total GAD-7 Score '4 4 2 '$ 0 3      Mini-Mental    Flowsheet Row Clinical Support from 11/15/2017 in [place] from 11/14/2016 in [facility]  Total Score (max 30 points ) 30 30      PHQ2-9    [place] Visit from 04/30/2022 in [place] Office Visit from 02/19/2022 in [place] from 12/29/2021 in [place] Office Visit from 12/14/2021 in [place] and [facility] at [place] Visit from 11/27/2021 in [place]  PHQ-2 Total Score 0 0 0 0 1  PHQ-9 Total Score -- -- 0 1 4      [place] Office Visit from 02/19/2022 in [place] Counselor from 02/15/2022 in [facility] Counselor from 01/03/2022 in [place]  HEALTH OUTPATIENT THERAPY   C-SSRS RISK CATEGORY No Risk No Risk No Risk        Assessment and Plan:  MARLON SULEIMAN is a 81 y.o. year old male with a history of  pseudo dementia, sleep disorder, tremor, history of QTc prolongation , who presents for follow up appointment for below.   1. MDD (major depressive disorder), recurrent, in partial remission ([place]) He denies any significant mood symptoms since the last visit.  Psychosocial stressors includes being concerned about his wife, financial strain, knee pain, conflict with his step son and his daughter.  He continues to actively involve in [place], and stays connected with his family including his wife.  Will continue current dose of bupropion to target depression.   2. Insomnia, unspecified type Improving.  Will continue current  dose of trazodone as needed for insomnia.  Noted that he tapered down quetiapine due to drowsiness in the morning; his neurologist, who prescribes this medication is aware of this. Discussed potential risk of fall with concomitant use of trazodone, quetiapine and gabapentin.    # History of cognitive impairment Followed-up by neurologist, and has been on memantine and donepezil. ("Memory evaluation" 28/30 on 04/2022))      Plan Continue bupropion XR 300 mg daily  Continue Trazodone 200 mg at night as needed for insomnia  Next appointment: 2/26 at 8 AM, in person - on quetiapine 75 mg at night (qtc 425 msec with iRBBB 06/08/2020 (550 msec on 1/10))- prescribed by another provider - on Donepezil 10 mg qhs, memantine 10 mg 10 mg twice a day - on Gabapentin 600 mg TID , primidone for tremor - on Depakote 500 mg BID for reportedly sleep disorder - on pramipexole     The patient demonstrates the following risk factors for suicide: Chronic risk factors for suicide include: psychiatric disorder of depression. Acute risk factors for suicide include: N/A. Protective factors for this patient include: positive social support, responsibility to others (children, family), coping skills and hope for the future. Considering these factors, the overall suicide risk at this point appears to be low. Patient is appropriate for outpatient follow up.  Normal tone, no rigidity, no postural tremors, no tardive dyskinesia   Collaboration of Care: Collaboration of Care: Other reviewed notes in [place]  Patient/Guardian was advised Release of Information must be obtained prior to any record release in order to collaborate their care with an outside provider. Patient/Guardian was advised if they have not already done so to contact the registration department to sign all necessary forms in order for [place] to release information regarding their care.   Consent: Patient/Guardian gives verbal consent for treatment and assignment  of benefits for services provided during this visit. Patient/Guardian expressed understanding and agreed to proceed.    Norman Clay, MD 04/30/2022, 8:40 AM

## 2022-04-29 ENCOUNTER — Telehealth: Payer: Self-pay | Admitting: Pharmacy Technician

## 2022-04-29 DIAGNOSIS — Z596 Low income: Secondary | ICD-10-CM

## 2022-04-29 NOTE — Progress Notes (Signed)
[facility])                                            [place] Team    04/29/2022  TREGAN READ 1940/11/08 [id number]  Received both patient and provider portion(s) of patient assistance application(s) for [employer] and [employer]. Faxed completed application and required documents into [employer] and [employer].    Walfred Bettendorf P. Cereniti Curb, [place]  [phone]

## 2022-04-30 ENCOUNTER — Ambulatory Visit (INDEPENDENT_AMBULATORY_CARE_PROVIDER_SITE_OTHER): Payer: PPO | Admitting: Psychiatry

## 2022-04-30 ENCOUNTER — Encounter: Payer: Self-pay | Admitting: Psychiatry

## 2022-04-30 VITALS — BP 132/76 | HR 88 | Temp 97.5°F | Ht 71.0 in | Wt 162.2 lb

## 2022-04-30 DIAGNOSIS — F3341 Major depressive disorder, recurrent, in partial remission: Secondary | ICD-10-CM | POA: Diagnosis not present

## 2022-04-30 DIAGNOSIS — G4700 Insomnia, unspecified: Secondary | ICD-10-CM | POA: Diagnosis not present

## 2022-04-30 NOTE — Patient Instructions (Signed)
Continue bupropion XR 300 mg daily  Continue Trazodone 200 mg at night as needed for insomnia  Next appointment: 2/26 at 8 AM

## 2022-05-03 ENCOUNTER — Telehealth: Payer: Self-pay | Admitting: Family Medicine

## 2022-05-03 NOTE — Telephone Encounter (Signed)
Please advise 

## 2022-05-03 NOTE — Telephone Encounter (Signed)
Copied from [place] [phone]. Topic: Appointment Scheduling - Scheduling Inquiry for Clinic >> May 03, 2022  3:34 PM Erskine Squibb wrote: Reason for CRM: The patient called in stating he just spoke with his provider and insurance company. He states his insurance company, [employer] needs a call on the provider line stating why his provider wants to do a PRP injection in his left ankle. This is stemming from an initial appt he had with his provider back in July. The number for [employer] is [phone].Please assist patient further

## 2022-05-05 ENCOUNTER — Other Ambulatory Visit: Payer: Self-pay | Admitting: Family Medicine

## 2022-05-05 DIAGNOSIS — N400 Enlarged prostate without lower urinary tract symptoms: Secondary | ICD-10-CM

## 2022-05-07 ENCOUNTER — Other Ambulatory Visit: Payer: Self-pay

## 2022-05-07 DIAGNOSIS — G2581 Restless legs syndrome: Secondary | ICD-10-CM

## 2022-05-07 MED ORDER — PRAMIPEXOLE DIHYDROCHLORIDE 0.125 MG PO TABS: ORAL_TABLET | ORAL | 0 refills | Status: DC

## 2022-05-10 ENCOUNTER — Ambulatory Visit (INDEPENDENT_AMBULATORY_CARE_PROVIDER_SITE_OTHER): Payer: PPO | Admitting: Licensed Clinical Social Worker

## 2022-05-10 DIAGNOSIS — F3341 Major depressive disorder, recurrent, in partial remission: Secondary | ICD-10-CM | POA: Diagnosis not present

## 2022-05-10 NOTE — Progress Notes (Signed)
Virtual Visit via Video Note  I connected with James Hood on 05/10/22 at 10:00 AM EST by a video enabled telemedicine application and verified that I am speaking with the correct person using two identifiers.  Location: Patient: home Provider: remote office [place], [place])   I discussed the limitations of evaluation and management by telemedicine and the availability of in person appointments. The patient expressed understanding and agreed to proceed.   I discussed the assessment and treatment plan with the patient. The patient was provided an opportunity to ask questions and all were answered. The patient agreed with the plan and demonstrated an understanding of the instructions.   The patient was advised to call back or seek an in-person evaluation if the symptoms worsen or if the condition fails to improve as anticipated.  I provided 45 minutes of non-face-to-face time during this encounter.   [place], LCSW   THERAPIST PROGRESS NOTE  Session Time: [phone]  Participation Level: Active  Behavioral Response: Neat and Well GroomedAlertAnxious  Type of Therapy: Individual Therapy  Treatment Goals addressed:LTG: Reduce frequency, intensity, and duration of depression symptoms as evidenced by: pt self report  ProgressTowards Goals: Progressing  Interventions: CBT  Summary: James Hood is a 81 y.o. male who presents with improving symptoms related to his depression diagnosis. Pt reports overall mood is stable and that he is managing situational stressors better.   Allowed pt to explore and express thoughts and feelings associated with recent life situations and external stressors.  Pt reports that he is continuing to experience pain associated with his ankle. Patient reports that he got a brace for his ankle, but the pain has increased since he got the brace. Patient reports that he feels that he is limited physically, and feels a sense of guilt because he is  unable to help his wife with tasks and household chores. Allowed pt to identify additional things that he could physically do around the house to support him feeling helpful towards his wife.   Patient reports that he is excited about upcoming Christmas gatherings--patient reports that he is going to his daughters house for Christmas Eve dinner, and his daughter's mother-in-law will also be there. Patient reports that he knows to just be there, be kind, and not make any comments that could potentially trigger conflict.  Patient reports that he is exploring possibly taking some classes at [facility] in [place].  Continued recommendations are as follows: self care behaviors, positive social engagements, focusing on overall work/home/life balance, and focusing on positive physical and emotional wellness.  Suicidal/Homicidal: No  Therapist Response: Pt is continuing to apply interventions learned in session into daily life situations. Pt is currently on track to meet goals utilizing interventions mentioned above. Personal growth and progress noted. Treatment to continue as indicated.   Plan: Return again in 4 weeks.  Diagnosis:  Encounter Diagnosis  Name Primary?   MDD (major depressive disorder), recurrent, in partial remission ([place]) Yes   Collaboration of Care: Other Pt encouraged to continue psychiatric care with Dr. Norman Clay  Patient/Guardian was advised Release of Information must be obtained prior to any record release in order to collaborate their care with an outside provider. Patient/Guardian was advised if they have not already done so to contact the registration department to sign all necessary forms in order for [place] to release information regarding their care.   Consent: Patient/Guardian gives verbal consent for treatment and assignment of benefits for services provided during this visit.  Patient/Guardian expressed understanding and agreed to proceed.   [place], LCSW 05/10/2022

## 2022-05-16 ENCOUNTER — Telehealth: Payer: PPO | Admitting: Family Medicine

## 2022-05-17 ENCOUNTER — Encounter: Payer: Self-pay | Admitting: Student in an Organized Health Care Education/Training Program

## 2022-05-17 ENCOUNTER — Ambulatory Visit
Payer: PPO | Attending: Student in an Organized Health Care Education/Training Program | Admitting: Student in an Organized Health Care Education/Training Program

## 2022-05-17 VITALS — BP 104/65 | HR 78 | Temp 97.0°F | Resp 16 | Ht 70.0 in | Wt 165.0 lb

## 2022-05-17 DIAGNOSIS — M5417 Radiculopathy, lumbosacral region: Secondary | ICD-10-CM | POA: Insufficient documentation

## 2022-05-17 DIAGNOSIS — M25572 Pain in left ankle and joints of left foot: Secondary | ICD-10-CM | POA: Insufficient documentation

## 2022-05-17 DIAGNOSIS — M19072 Primary osteoarthritis, left ankle and foot: Secondary | ICD-10-CM | POA: Insufficient documentation

## 2022-05-17 DIAGNOSIS — M47816 Spondylosis without myelopathy or radiculopathy, lumbar region: Secondary | ICD-10-CM | POA: Insufficient documentation

## 2022-05-17 DIAGNOSIS — G8929 Other chronic pain: Secondary | ICD-10-CM | POA: Insufficient documentation

## 2022-05-17 DIAGNOSIS — M5386 Other specified dorsopathies, lumbar region: Secondary | ICD-10-CM | POA: Insufficient documentation

## 2022-05-17 DIAGNOSIS — M958 Other specified acquired deformities of musculoskeletal system: Secondary | ICD-10-CM | POA: Insufficient documentation

## 2022-05-17 DIAGNOSIS — G894 Chronic pain syndrome: Secondary | ICD-10-CM | POA: Diagnosis not present

## 2022-05-17 MED ORDER — HYDROCODONE-ACETAMINOPHEN 10-325 MG PO TABS: 1.0000 | ORAL_TABLET | Freq: Four times a day (QID) | ORAL | 0 refills | Status: DC | PRN

## 2022-05-17 NOTE — Progress Notes (Signed)
Nursing Pain Medication Assessment:  Safety precautions to be maintained throughout the outpatient stay will include: orient to surroundings, keep bed in low position, maintain call bell within reach at all times, provide assistance with transfer out of bed and ambulation.  Medication Inspection Compliance: Pill count conducted under aseptic conditions, in front of the patient. Neither the pills nor the bottle was removed from the patient's sight at any time. Once count was completed pills were immediately returned to the patient in their original bottle.  Medication: Oxycodone/APAP Pill/Patch Count:  35 of 120 pills remain Pill/Patch Appearance: Markings consistent with prescribed medication Bottle Appearance: Standard pharmacy container. Clearly labeled. Filled Date: 35 / 06 / 2023 Last Medication intake:  Yesterday

## 2022-05-17 NOTE — Progress Notes (Signed)
PROVIDER NOTE: Information contained herein reflects review and annotations entered in association with encounter. Interpretation of such information and data should be left to medically-trained personnel. Information provided to patient can be located elsewhere in the medical record under "Patient Instructions". Document created using STT-dictation technology, any transcriptional errors that may result from process are unintentional.    Patient: James Hood  Service Category: E/M  Provider: [name], MD  DOB: 1940-09-24  DOS: 05/17/2022  Specialty: Interventional Pain Management  MRN: [id number]  Setting: Ambulatory outpatient  PCP: Leone Haven, MD  Type: Established Patient    Referring Provider: Leone Haven, MD  Location: Office  Delivery: Face-to-face     HPI  Mr. MELCHOR KIRCHGESSNER, a 81 y.o. 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 Mr. Lansdowne's primary complain today is Ankle Pain (Left )  Last encounter: My last encounter with him was on 02/20/22  Pertinent problems: Mr. Lough has Primary localized osteoarthritis of right knee; DJD (degenerative joint disease) of knee; Lumbar herniated disc; Chronic pain syndrome; Failed back surgical syndrome; Lumbosacral radiculopathy; Primary osteoarthritis of left ankle; Lumbar facet joint syndrome; Neuropathy; Chronic pain of left ankle; and Osteochondral defect of ankle on their pertinent problem list. Pain Assessment: Severity of Chronic pain is reported as a 6 /10. Location: Ankle Left/up the left leg to approx the thigh sometimes,  ususally just up to the knee. Onset: More than a month ago. Quality: Discomfort, Aching, Constant. Timing: Constant. Modifying factor(s): tylenol arthritis strength, ibuprofen 800 mg which helps for approx an hour.  pain meds do not help. Vitals:  height is _0  (1.778 m) and weight is 165 lb (74.8 kg). His temporal temperature is 97 F (36.1 C)  (abnormal). His blood pressure is 104/65 and his pulse is 78. His respiration is 16 and oxygen saturation is 95%.   Reason for encounter: medication management.   Presents today for medication management.   Continues to have persistent left ankle pain, has tried left ankle orthotic which was not helpful. Has seen Dr Zigmund Daniel to discuss a PRP injection. Awaiting insurance approval and further discussion with Dr Zigmund Daniel We discussed an opioid rotation to Hydrocodone 10 mg every 4-6 hrs as needed #150/month (equivalent MME to his current dose).  Informed pt that I will NOT be escalating beyond 60 MME. I also discussed an opioid holiday with pt which he was not interested in. Continue with gabapentin as Rx'd  Pharmacotherapy Assessment  Analgesic:  Oxycodone 10 mg every 6 hours as needed, quantity 120/month; MME equals 60    Monitoring: [place] PMP: PDMP reviewed during this encounter.       Pharmacotherapy: No side-effects or adverse reactions reported. Compliance: No problems identified. Effectiveness: Clinically acceptable.  Janett Billow, RN  05/17/2022  8:25 AM  Sign when Signing Visit Nursing Pain Medication Assessment:  Safety precautions to be maintained throughout the outpatient stay will include: orient to surroundings, keep bed in low position, maintain call bell within reach at all times, provide assistance with transfer out of bed and ambulation.  Medication Inspection Compliance: Pill count conducted under aseptic conditions, in front of the patient. Neither the pills nor the bottle was removed from the patient's sight at any time. Once count was completed pills were immediately returned to the patient in their original bottle.  Medication: Oxycodone/APAP Pill/Patch Count:  35 of 120 pills remain Pill/Patch Appearance: Markings consistent with prescribed medication Bottle Appearance: Standard pharmacy  container. Clearly labeled. Filled Date: 24 / 06 / 2023 Last Medication  intake:  Yesterday     UDS:  Summary  Date Value Ref Range Status  11/28/2021 Note  Final    Comment:    ==================================================================== [employer] Select 13 (MW) ==================================================================== Test                             Result       Flag       Units  Drug Present and Declared for Prescription Verification   Oxycodone                      176          EXPECTED   ng/mg creat   Oxymorphone                    732          EXPECTED   ng/mg creat   Noroxycodone                   1900         EXPECTED   ng/mg creat   Noroxymorphone                 585          EXPECTED   ng/mg creat    Sources of oxycodone are scheduled prescription medications.    Oxymorphone, noroxycodone, and noroxymorphone are expected    metabolites of oxycodone. Oxymorphone is also available as a    scheduled prescription medication.    Phenobarbital                  PRESENT      EXPECTED    Phenobarbital is an expected metabolite of primidone; Phenobarbital    may also be administered as a prescription drug.  Drug Absent but Declared for Prescription Verification   Hydromorphone                  Not Detected UNEXPECTED ng/mg creat ==================================================================== Test                      Result    Flag   Units      Ref Range   Creatinine              34               mg/dL      >=20 ==================================================================== Declared Medications:  The flagging and interpretation on this report are based on the  following declared medications.  Unexpected results may arise from  inaccuracies in the declared medications.   **Note: The testing scope of this panel includes these medications:   Hydromorphone (Dilaudid)  Oxycodone (Percocet)  Primidone (Mysoline)   **Note: The testing scope of this panel does not include the  following reported medications:    Acetaminophen (Percocet)  Aspirin  Atropine (Lomotil)  Budesonide (Entocort)  Bupropion (Wellbutrin XL)  Cholestyramine (Questran)  Dapagliflozin (Farxiga)  Diphenoxylate (Lomotil)  Divaleproex (Depakote)  Donepezil (Aricept)  Doxycycline  Fenofibrate (TriCor)  Gabapentin (Neurontin)  Hydrochlorothiazide (Zestoretic)  Iron  Lisinopril (Zestoretic)  Memantine (Namenda)  Metformin (Glucophage)  Metoprolol (Toprol)  Multivitamin  Omega-3 Fatty Acids  Omeprazole (Prilosec)  Pramipexole (Mirapex)  Promethazine  Quetiapine (Seroquel)  Rosuvastatin (Crestor)  Semaglutide (Ozempic)  Tamsulosin (Flomax)  Trazodone (Desyrel)  Varenicline (Chantix)  Vitamin C ==================================================================== For clinical consultation, please call [phone]. ====================================================================      ROS  Constitutional: Denies any fever or chills Gastrointestinal: No reported hemesis, hematochezia, vomiting, or acute GI distress Musculoskeletal: Low back pain, left ankle pain Neurological: No reported episodes of acute onset apraxia, aphasia, dysarthria, agnosia, amnesia, paralysis, loss of coordination, or loss of consciousness  Medication Review  FreeStyle Libre 14 Day Sensor, HYDROcodone-acetaminophen, Multiple Vitamins-Minerals, QUEtiapine, Semaglutide (1 MG/DOSE), ascorbic acid, aspirin EC, buPROPion, budesonide, cholestyramine, dapagliflozin propanediol, diphenoxylate-atropine, divalproex, donepezil, fenofibrate, ferrous sulfate, gabapentin, lisinopril-hydrochlorothiazide, memantine, metFORMIN, metoprolol succinate, omega-3 acid ethyl esters, omeprazole, pramipexole, primidone, promethazine-dextromethorphan, rosuvastatin, tamsulosin, and traZODone  History Review  Allergy: Mr. Bugaj has No Known Allergies. Drug: Mr. Hitz  reports no history of drug use. Alcohol:  reports that he does not currently use  alcohol. Tobacco:  reports that he has been smoking cigarettes. He has a 30.00 pack-year smoking history. He has never used smokeless tobacco. Social: Mr. Gopal  reports that he has been smoking cigarettes. He has a 30.00 pack-year smoking history. He has never used smokeless tobacco. He reports that he does not currently use alcohol. He reports that he does not use drugs. 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 Surgical: Mr. Penado  has a past surgical history that includes esophageal stretch; Tonsillectomy; Total knee arthroplasty (Right, 11/15/2014); Lumbar laminectomy/decompression microdiscectomy (Left, 02/03/2016); Colonoscopy with propofol (N/A, 09/14/2016); Joint replacement (Right, 2016); Pulse generator implant (N/A, 08/21/2017); Back surgery; Pulse generator implant (Right, 04/02/2018); Ankle arthroscopy (Left, 09/29/2019); and Esophagogastroduodenoscopy (egd) with propofol (N/A, 12/13/2020). Family: family history includes Depression in his father; Heart attack in his mother; Heart disease in his mother; Hypertension in his father.  Laboratory Chemistry Profile   Renal Lab Results  Component Value Date   BUN 31 (H) 02/13/2022   CREATININE 1.33 02/13/2022   GFR 49.94 (L) 02/13/2022   GFRAA >60 09/25/2019   GFRNONAA 55 (L) 06/08/2020    Hepatic Lab Results  Component Value Date   AST 21  09/19/2021   ALT 14 09/19/2021   ALBUMIN 4.3 09/19/2021   ALKPHOS 46 09/19/2021   AMMONIA 14 08/09/2016    Electrolytes Lab Results  Component Value Date   NA 137 02/13/2022   K 3.9 02/13/2022   CL 99 02/13/2022   CALCIUM 9.9 02/13/2022   MG 1.4 (L) 09/08/2020    Bone Lab Results  Component Value Date   VD25OH 37.09 11/10/2021    Inflammation (CRP: Acute Phase) (ESR: Chronic Phase) Lab Results  Component Value Date   ESRSEDRATE 15 07/20/2019   LATICACIDVEN 1.9 06/08/2020         Note: Above Lab results reviewed.   CLINICAL DATA:  Left sciatic pain for 2 years.  Prior back surgery.   EXAM: MRI LUMBAR SPINE WITHOUT CONTRAST   TECHNIQUE: Multiplanar, multisequence MR imaging of the lumbar spine was performed. No intravenous contrast was administered.   COMPARISON:  03/29/2016   FINDINGS: Segmentation:  Standard.   Alignment:  Physiologic.   Vertebrae:  No fracture, evidence of discitis, or bone lesion.  Conus medullaris and cauda equina: Conus extends to the T12-L1 level. Conus and cauda equina appear normal.   Paraspinal and other soft tissues: No paraspinal abnormality.   Disc levels:   Disc spaces: Degenerative disc disease mild disc height loss throughout the lumbar spine.   T12-L1: No significant disc bulge. No evidence of neural foraminal stenosis. No central canal stenosis.   L1-L2: No significant disc bulge. No evidence of neural foraminal stenosis. No central canal stenosis.   L2-L3: No significant disc bulge. No evidence of neural foraminal stenosis. No central canal stenosis.   L3-L4: Mild broad-based disc bulge. Mild bilateral facet arthropathy. Moderate right foraminal stenosis. No central canal stenosis.   L4-L5: Mild broad-based disc bulge. Severe bilateral facet arthropathy. Bilateral lateral recess stenosis. Mild bilateral foraminal stenosis. No central canal stenosis.   L5-S1: Broad-based disc bulge with a small central disc  protrusion. Moderate bilateral facet arthropathy. Mild left foraminal stenosis. No central canal stenosis.   IMPRESSION: 1. At L3-4 there is a mild broad-based disc bulge. Mild bilateral facet arthropathy. Moderate right foraminal stenosis. 2. At L4-5 there is a mild broad-based disc bulge. Severe bilateral facet arthropathy. Bilateral lateral recess stenosis. Mild bilateral foraminal stenosis. 3. At L5-S1 there is a broad-based disc bulge with a small central disc protrusion. Moderate bilateral facet arthropathy. Mild left foraminal stenosis.     Electronically Signed   By: Kathreen Devoid   On: 04/19/2017 10:33  Physical Exam  General appearance: Well nourished, well developed, and well hydrated. In no apparent acute distress Mental status: Alert, oriented x 3 (person, place, & time)       Respiratory: No evidence of acute respiratory distress Eyes: PERLA Vitals: BP 104/65 (BP Location: Right Arm, Patient Position: Sitting, Cuff Size: Normal)   Pulse 78   Temp (!) 97 F (36.1 C) (Temporal)   Resp 16   Ht _0  (1.778 m)   Wt 165 lb (74.8 kg)   SpO2 95%   BMI 23.68 kg/m  BMI: Estimated body mass index is 23.68 kg/m as calculated from the following:   Height as of this encounter: _1  (1.778 m).   Weight as of this encounter: 165 lb (74.8 kg). Ideal: Ideal body weight: 73 kg (160 lb 15 oz) Adjusted ideal body weight: 73.7 kg (162 lb 9 oz)  Cervical Spine Area Exam  Skin & Axial Inspection: No masses, redness, edema, swelling, or associated skin lesions Alignment: Symmetrical Functional ROM: Unrestricted ROM      Stability: No instability detected Muscle Tone/Strength: Functionally intact. No obvious neuro-muscular anomalies detected. Sensory (Neurological): Unimpaired Palpation: No palpable anomalies              Lumbar Spine Area Exam  Skin & Axial Inspection: No masses, redness, or swelling Alignment: Symmetrical Functional ROM: Pain restricted ROM        Stability: No instability detected Muscle Tone/Strength: Functionally intact. No obvious neuro-muscular anomalies detected. Sensory (Neurological): Musculoskeletal pain pattern articular, facet mediated Palpation: No palpable anomalies        Gait & Posture Assessment  Ambulation: Unassisted Gait: Relatively normal for age and body habitus Posture: WNL  Lower Extremity Exam    Side: Right lower extremity  Side: Left lower extremity  Stability: No instability observed          Stability: No instability observed          Skin & Extremity Inspection: Skin color, temperature, and hair growth are WNL. No peripheral edema or cyanosis. No  masses, redness, swelling, asymmetry, or associated skin lesions. No contractures.  Skin & Extremity Inspection: Skin color, temperature, and hair growth are WNL. No peripheral edema or cyanosis. No masses, redness, swelling, asymmetry, or associated skin lesions. No contractures.  Functional ROM: Unrestricted ROM                  Functional ROM: Pain restricted ROM  left ankle          Muscle Tone/Strength: Functionally intact. No obvious neuro-muscular anomalies detected.  Muscle Tone/Strength: Functionally intact. No obvious neuro-muscular anomalies detected.  Sensory (Neurological): Unimpaired        Sensory (Neurological): Arthropathic arthralgia ankle        DTR: Patellar: deferred today Achilles: deferred today Plantar: deferred today  DTR: Patellar: deferred today Achilles: deferred today Plantar: deferred today  Palpation: No palpable anomalies  Palpation: No palpable anomalies    Assessment   Diagnosis  1. Primary osteoarthritis of left ankle   2. Chronic pain of left ankle   3. Osteochondral defect of ankle (left)   4. Lumbar facet joint syndrome   5. Facet arthritis of lumbar region   6. Lumbosacral radiculopathy   7. Sciatica of left side associated with disorder of lumbar spine   8. Chronic pain syndrome        Plan of Care    [name] has a current medication list which includes the following long-term medication(s): bupropion, cholestyramine, donepezil, fenofibrate, gabapentin, lisinopril-hydrochlorothiazide, memantine, metformin, metoprolol succinate, omega-3 acid ethyl esters, omeprazole, pramipexole, primidone, quetiapine, rosuvastatin, and trazodone.   Meds ordered this encounter  Medications   HYDROcodone-acetaminophen (NORCO) 10-325 MG tablet    Sig: Take 1-2 tablets by mouth every 6 (six) hours as needed for severe pain. Must last 30 days.    Dispense:  150 tablet    Refill:  0    Chronic Pain: STOP Act (Not applicable) Fill 1 day early if closed on refill date. Avoid benzodiazepines within 8 hours of opioids   HYDROcodone-acetaminophen (NORCO) 10-325 MG tablet    Sig: Take 1-2 tablets by mouth every 6 (six) hours as needed for severe pain. Must last 30 days.    Dispense:  150 tablet    Refill:  0    Chronic Pain: STOP Act (Not applicable) Fill 1 day early if closed on refill date. Avoid benzodiazepines within 8 hours of opioids   HYDROcodone-acetaminophen (NORCO) 10-325 MG tablet    Sig: Take 1-2 tablets by mouth every 6 (six) hours as needed for severe pain. Must last 30 days.    Dispense:  150 tablet    Refill:  0    Chronic Pain: STOP Act (Not applicable) Fill 1 day early if closed on refill date. Avoid benzodiazepines within 8 hours of opioids   Continue with psychiatric care  Follow up with Dr Zigmund Daniel regarding left ankle PRP Continue with Gabapentin UDS UTD   Follow-up plan:   Return in about 15 weeks (around 08/30/2022) for Medication Management, in person.    Recent Visits Date Type Provider Dept  02/20/22 Office Visit [name], MD [facility]  Showing recent visits within past 90 days and meeting all other requirements Today's Visits Date Type Provider Dept  05/17/22 Office Visit [name], MD [facility]  Showing today's visits and  meeting all other requirements Future Appointments No visits were found meeting these conditions. Showing future appointments within next 90 days and meeting all other requirements  I discussed the  assessment and treatment plan with the patient. The patient was provided an opportunity to ask questions and all were answered. The patient agreed with the plan and demonstrated an understanding of the instructions.  Patient advised to call back or seek an in-person evaluation if the symptoms or condition worsens.  Duration of encounter: 30 minutes.  Note by: [name], MD Date: 05/17/2022; Time: 8:43 AM

## 2022-05-24 ENCOUNTER — Ambulatory Visit (INDEPENDENT_AMBULATORY_CARE_PROVIDER_SITE_OTHER): Payer: PPO | Admitting: Family Medicine

## 2022-05-24 ENCOUNTER — Encounter: Payer: Self-pay | Admitting: Family Medicine

## 2022-05-24 VITALS — BP 130/70 | HR 71 | Temp 97.6°F | Ht 71.0 in | Wt 161.2 lb

## 2022-05-24 DIAGNOSIS — E785 Hyperlipidemia, unspecified: Secondary | ICD-10-CM

## 2022-05-24 DIAGNOSIS — G4710 Hypersomnia, unspecified: Secondary | ICD-10-CM | POA: Diagnosis not present

## 2022-05-24 DIAGNOSIS — E1142 Type 2 diabetes mellitus with diabetic polyneuropathy: Secondary | ICD-10-CM | POA: Diagnosis not present

## 2022-05-24 LAB — POCT GLYCOSYLATED HEMOGLOBIN (HGB A1C): Hemoglobin A1C: 6 % — AB (ref 4.0–5.6)

## 2022-05-24 NOTE — Assessment & Plan Note (Addendum)
Chronic issue.  Check A1c.  We will reduce his Ozempic to 0.75 mg weekly.  If his wife has questions on how to do this dose they will let us know.  If he continues to have low sugars on that dose of Ozempic we will reduce it further to 0.5 mg weekly.  He will continue Farxiga 10 mg daily and metformin 500 mg twice daily.

## 2022-05-24 NOTE — Assessment & Plan Note (Signed)
Chronic issue.  Adequately controlled on most recent check.  He will continue Crestor 40 mg daily.

## 2022-05-24 NOTE — Patient Instructions (Signed)
Nice to see you. Please reduce your Ozempic dose to 0.75 mg weekly.  If you're wife asked questions on how to get to this dose please let me know.  If your blood sugar continues to be low on this dose you need to reduce your Ozempic dose further to 0.5 mg weekly.

## 2022-05-24 NOTE — Assessment & Plan Note (Addendum)
Likely medication related.  He is on several medications through psychiatry that could be sedating.  I am going to order a home sleep study to evaluate for underlying sleep apnea.  Sleepiness scale score of 10 with individual scores of 2, 3, 0, 2, 3, 0, 0, 0.

## 2022-05-24 NOTE — Progress Notes (Signed)
James Rumps, MD Phone: [phone]  BRUK TUMOLO is a 82 y.o. male who presents today for f/u  DIABETES Disease Monitoring: Blood Sugar ranges-checking <3 times daily with CGM, 90 day average is 108 Polyuria/phagia/dipsia- no      Optho- due in March Medications: Compliance- taking farxiga, metformin, ozempic Hypoglycemic symptoms- no, though has had some lows on cgm  HYPERLIPIDEMIA Symptoms Chest pain on exertion:  no   Leg claudication:   no Medications: Compliance- taking crestor, fenofibrate Right upper quadrant pain- no Lipid Panel     Component Value Date/Time   CHOL 186 08/02/2021 1400   TRIG 304.0 (H) 08/02/2021 1400   HDL 59.10 08/02/2021 1400   CHOLHDL 3 08/02/2021 1400   VLDL 60.8 (H) 08/02/2021 1400   LDLCALC 57 09/25/2017 0924   LDLDIRECT 35.0 09/19/2021 1015   Hypersomnia: Patient reports that his wife is concerned that he may sleep a lot.  Last night he fell asleep from 6-10 watching TV and then went to bed and slept until 11 AM.  He was previously taking 5 pills of Seroquel and trazodone though notes his Seroquel dose was recently decreased to 3 pills.  He notes his sleepiness has improved some since that dose reduction.  He does wake up well rested.   Social History   Tobacco Use  Smoking Status Every Day   Packs/day: 0.50   Years: 60.00   Total pack years: 30.00   Types: Cigarettes  Smokeless Tobacco Never    Current Outpatient Medications on File Prior to Visit  Medication Sig Dispense Refill   aspirin EC 81 MG tablet Take 81 mg by mouth daily.     budesonide (ENTOCORT EC) 3 MG 24 hr capsule Take 2 capsules (6 mg total) by mouth daily. 180 capsule 0   buPROPion (WELLBUTRIN XL) 300 MG 24 hr tablet Take 1 tablet (300 mg total) by mouth daily. 90 tablet 1   cholestyramine (QUESTRAN) 4 g packet Take 1 packet by mouth 2 (two) times daily.     Continuous Blood Gluc Sensor (FREESTYLE LIBRE 14 DAY SENSOR) MISC APPLY 1 SENSOR EVERY 14 DAYS TO  MONITOR BLOOD GLUCOSE LEVELS. REMOVE OLD SENSOR BEFORE APPLYING NEW SENSOR 2 each 1   Continuous Blood Gluc Sensor (FREESTYLE LIBRE 14 DAY SENSOR) MISC APPLY 1 SENSOR EVERY 14 DAYS TO MONITOR BLOOD GLUCOSE LEVELS. REMOVE OLD SENSOR BEFORE APPLYING NEW SENSOR 2 each 5   dapagliflozin propanediol (FARXIGA) 10 MG TABS tablet Take 1 tablet (10 mg total) by mouth daily. 90 tablet 3   diphenoxylate-atropine (LOMOTIL) 2.5-0.025 MG tablet Take by mouth.     divalproex (DEPAKOTE ER) 500 MG 24 hr tablet Take 1 tablet by mouth 2 (two) times daily.     donepezil (ARICEPT) 10 MG tablet Take 10 mg by mouth at bedtime.     fenofibrate (TRICOR) 145 MG tablet Take 1 tablet (145 mg total) by mouth daily. 90 tablet 1   ferrous sulfate 325 (65 FE) MG EC tablet Take 325 mg by mouth daily.     gabapentin (NEURONTIN) 600 MG tablet TAKE TWO TABLETS BY MOUTH THREE TIMES A DAY 180 tablet 1   [START ON 06/06/2022] HYDROcodone-acetaminophen (NORCO) 10-325 MG tablet Take 1-2 tablets by mouth every 6 (six) hours as needed for severe pain. Must last 30 days. 150 tablet 0   [START ON 07/06/2022] HYDROcodone-acetaminophen (NORCO) 10-325 MG tablet Take 1-2 tablets by mouth every 6 (six) hours as needed for severe pain. Must last 30 days. [place]  tablet 0   [START ON 08/05/2022] HYDROcodone-acetaminophen (NORCO) 10-325 MG tablet Take 1-2 tablets by mouth every 6 (six) hours as needed for severe pain. Must last 30 days. 150 tablet 0   lisinopril-hydrochlorothiazide (ZESTORETIC) 20-25 MG tablet TAKE ONE TABLET BY MOUTH TWICE A DAY 180 tablet 1   memantine (NAMENDA) 10 MG tablet Take 1 tablet by mouth daily.     metFORMIN (GLUCOPHAGE-XR) 500 MG 24 hr tablet TAKE ONE TABLET BY MOUTH TWICE A DAY 180 tablet 1   metoprolol succinate (TOPROL-XL) 25 MG 24 hr tablet Take 25 mg by mouth daily.     Multiple Vitamins-Minerals (CENTRUM SILVER PO) Take 1 tablet by mouth daily.     omega-3 acid ethyl esters (LOVAZA) 1 g capsule TAKE TWO CAPSULES BY MOUTH  TWICE A DAY 360 capsule 3   omeprazole (PRILOSEC) 20 MG capsule Take 20 mg by mouth daily.     pramipexole (MIRAPEX) 0.125 MG tablet TAKE 1 TABLET BY MOUTH DAILY TAKE 2 TO 3 HOURS PRIOR TO BEDTIME 90 tablet 0   primidone (MYSOLINE) 50 MG tablet Take 150-250 mg by mouth 2 (two) times daily. Take 250 mg by mouth in the morning & take 150 mg at night.     promethazine-dextromethorphan (PROMETHAZINE-DM) 6.25-15 MG/5ML syrup Take 5 mLs by mouth 4 (four) times daily as needed for cough. 118 mL 0   QUEtiapine (SEROQUEL) 100 MG tablet Take 125 mg by mouth at bedtime.     rosuvastatin (CRESTOR) 40 MG tablet Take 1 tablet (40 mg total) by mouth daily. 90 tablet 3   Semaglutide, 1 MG/DOSE, (OZEMPIC, 1 MG/DOSE,) 4 MG/3ML SOPN Inject 1 mg into the skin once a week. 3 mL 3   tamsulosin (FLOMAX) 0.4 MG CAPS capsule TAKE 2 CAPSULES BY MOUTH DAILY AFTER SUPPER 180 capsule 0   traZODone (DESYREL) 100 MG tablet Take 2 tablets (200 mg total) by mouth at bedtime as needed for sleep. 180 tablet 0   vitamin C (ASCORBIC ACID) 500 MG tablet Take 500 mg by mouth daily.     No current facility-administered medications on file prior to visit.     ROS see history of present illness  Objective  Physical Exam Vitals:   05/24/22 1547  BP: 130/70  Pulse: 71  Temp: 97.6 F (36.4 C)  SpO2: 94%    BP Readings from Last 3 Encounters:  05/24/22 130/70  05/17/22 104/65  02/20/22 116/66   Wt Readings from Last 3 Encounters:  05/24/22 161 lb 3.2 oz (73.1 kg)  05/17/22 165 lb (74.8 kg)  02/20/22 163 lb (73.9 kg)    Physical Exam Constitutional:      General: He is not in acute distress.    Appearance: He is not diaphoretic.  Cardiovascular:     Rate and Rhythm: Normal rate and regular rhythm.     Heart sounds: Normal heart sounds.  Pulmonary:     Effort: Pulmonary effort is normal.     Breath sounds: Normal breath sounds.  Skin:    General: Skin is warm and dry.  Neurological:     Mental Status: He is  alert.      Assessment/Plan: Please see individual problem list.  Type 2 diabetes mellitus with diabetic polyneuropathy, without long-term current use of insulin ([place]) Assessment & Plan: Chronic issue.  Check A1c.  We will reduce his Ozempic to 0.75 mg weekly.  If his wife has questions on how to do this dose they will let us know.  If  he continues to have low sugars on that dose of Ozempic we will reduce it further to 0.5 mg weekly.  He will continue Farxiga 10 mg daily and metformin 500 mg twice daily.  Orders: -     POCT glycosylated hemoglobin (Hb A1C)  Hyperlipidemia, unspecified hyperlipidemia type Assessment & Plan: Chronic issue.  Adequately controlled on most recent check.  He will continue Crestor 40 mg daily.   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  He is on several medications through psychiatry that could be sedating.  I am going to order a home sleep study to evaluate for underlying sleep apnea.  Sleepiness scale score of 10 with individual scores of 2, 3, 0, 2, 3, 0, 0, 0.  Orders: -     Home sleep test     Return in about 3 months (around 08/23/2022) for dm.   James Rumps, MD [place]

## 2022-05-25 ENCOUNTER — Telehealth: Payer: Self-pay | Admitting: Student in an Organized Health Care Education/Training Program

## 2022-05-25 ENCOUNTER — Telehealth: Payer: Self-pay

## 2022-05-25 DIAGNOSIS — M19072 Primary osteoarthritis, left ankle and foot: Secondary | ICD-10-CM

## 2022-05-25 DIAGNOSIS — G8929 Other chronic pain: Secondary | ICD-10-CM

## 2022-05-25 DIAGNOSIS — G894 Chronic pain syndrome: Secondary | ICD-10-CM

## 2022-05-25 DIAGNOSIS — M5417 Radiculopathy, lumbosacral region: Secondary | ICD-10-CM

## 2022-05-25 MED ORDER — HYDROCODONE-ACETAMINOPHEN 10-325 MG PO TABS: 1.0000 | ORAL_TABLET | Freq: Four times a day (QID) | ORAL | 0 refills | Status: AC | PRN

## 2022-05-25 NOTE — Telephone Encounter (Signed)
Dr Holley Raring, I looked back at his prescriptions had he last filled oxycodone on 04-25-22.  You switched him to Hydrocodone but put the start date as 06-06-22.  He states he runs out today.  I tried to find a reason that this was done, but I can't find one.  Can you check on this please.  Thank  you

## 2022-05-25 NOTE — Telephone Encounter (Signed)
Resent with correct fill dates  Please cancel previous Hydrocodone Rx with incorrect fill dates  Requested Prescriptions   Signed Prescriptions Disp Refills   HYDROcodone-acetaminophen (NORCO) 10-325 MG tablet 150 tablet 0    Sig: Take 1-2 tablets by mouth every 6 (six) hours as needed for severe pain. Must last 30 days.    Authorizing Provider: [name]   HYDROcodone-acetaminophen (NORCO) 10-325 MG tablet 150 tablet 0    Sig: Take 1-2 tablets by mouth every 6 (six) hours as needed for severe pain. Must last 30 days.    Authorizing Provider: [name]   HYDROcodone-acetaminophen (NORCO) 10-325 MG tablet 150 tablet 0    Sig: Take 1-2 tablets by mouth every 6 (six) hours as needed for severe pain. Must last 30 days.    Authorizing Provider: [name]

## 2022-05-25 NOTE — Telephone Encounter (Signed)
Patient states his meds run out on 05-26-22, when he was here Dr Holley Raring sent scripts to fill on 06-06-22. Please check and let patient know status

## 2022-05-25 NOTE — Telephone Encounter (Signed)
Cancelled scripts for Hydrocodone that were sent in on 05-17-2022.  Spoke with James Hood

## 2022-05-29 ENCOUNTER — Other Ambulatory Visit: Payer: Self-pay | Admitting: Psychiatry

## 2022-05-31 DIAGNOSIS — G4733 Obstructive sleep apnea (adult) (pediatric): Secondary | ICD-10-CM | POA: Diagnosis not present

## 2022-05-31 DIAGNOSIS — R0602 Shortness of breath: Secondary | ICD-10-CM | POA: Diagnosis not present

## 2022-06-01 ENCOUNTER — Telehealth: Payer: Self-pay | Admitting: Pharmacy Technician

## 2022-06-01 DIAGNOSIS — G4733 Obstructive sleep apnea (adult) (pediatric): Secondary | ICD-10-CM | POA: Diagnosis not present

## 2022-06-01 DIAGNOSIS — Z596 Low income: Secondary | ICD-10-CM

## 2022-06-01 DIAGNOSIS — R0602 Shortness of breath: Secondary | ICD-10-CM | POA: Diagnosis not present

## 2022-06-01 DIAGNOSIS — J988 Other specified respiratory disorders: Secondary | ICD-10-CM | POA: Diagnosis not present

## 2022-06-01 NOTE — Progress Notes (Signed)
[facility])                                            [place] Team    06/01/2022  James Hood 09/18/1940 [id number]  Care coordination call placed to [place] in regard to [facility] application.  Spoke to [place] who informs patient is APPROVED 05/21/22-05/21/23. Medication will auto ship based on last fill date in 2023 and be delivered to the patient's home.  James Hood, [place]  [phone]

## 2022-06-04 ENCOUNTER — Other Ambulatory Visit: Payer: Self-pay | Admitting: Psychiatry

## 2022-06-05 ENCOUNTER — Other Ambulatory Visit: Payer: Self-pay | Admitting: Psychiatry

## 2022-06-05 MED ORDER — TRAZODONE HCL 100 MG PO TABS: 200.0000 mg | ORAL_TABLET | Freq: Every evening | ORAL | 0 refills | Status: DC | PRN

## 2022-06-05 NOTE — Telephone Encounter (Signed)
Ordered. Please make sure that he takes it  up to two tabs at night as needed.

## 2022-06-05 NOTE — Telephone Encounter (Signed)
pt left a message that you denied request for refilling the trazodone. pt states that he has lost the trazodone and he doesn't know what he did with it. he realizes he have to pay out of pocket for medication but he is ok with doing that

## 2022-06-08 ENCOUNTER — Telehealth: Payer: Self-pay | Admitting: Pharmacy Technician

## 2022-06-08 DIAGNOSIS — Z596 Low income: Secondary | ICD-10-CM

## 2022-06-08 NOTE — Progress Notes (Signed)
James Hood)                                            James Hood    06/08/2022  James Hood 08-Apr-1941 [id number]  Care coordination call placed to [place] in regard to James Hood application.  Spoke to James Hood who informs patient's id number is [id number]. She informs more up to date income is needed such as 2024 awards letter from social security or 2022 tax return or tax document 1099. Patient is aware of this information.  Received updated income for patient.   Submitted to [employer] via fax.  James Hood, [place]  [phone]

## 2022-06-12 ENCOUNTER — Telehealth: Payer: Self-pay | Admitting: Family Medicine

## 2022-06-12 NOTE — Telephone Encounter (Signed)
Copied from [place] [id number]. Topic: General - Inquiry >> Jun 12, 2022  2:51 PM Erskine Squibb wrote: Reason for CRM: The patient called back in checking on the status of a previous call in a little over a month ago. He states his insurance is still waiting to hear from his provider as to why he wants to do this specific injection on his left ankle. Please assist patient further.

## 2022-06-12 NOTE — Telephone Encounter (Signed)
Please advise 

## 2022-06-13 ENCOUNTER — Telehealth: Payer: Self-pay

## 2022-06-13 ENCOUNTER — Other Ambulatory Visit: Payer: Self-pay | Admitting: Family Medicine

## 2022-06-13 DIAGNOSIS — M19072 Primary osteoarthritis, left ankle and foot: Secondary | ICD-10-CM

## 2022-06-13 DIAGNOSIS — G8929 Other chronic pain: Secondary | ICD-10-CM

## 2022-06-13 DIAGNOSIS — M958 Other specified acquired deformities of musculoskeletal system: Secondary | ICD-10-CM

## 2022-06-13 NOTE — Telephone Encounter (Signed)
I called the patient and informed him that his medication from patient assistance has arrived and is available for pickup and he understood.  [name],cma

## 2022-06-13 NOTE — Telephone Encounter (Signed)
PC to pt, faxed PA to insurance will inform as soon as we know the outcome.

## 2022-06-19 ENCOUNTER — Telehealth: Payer: Self-pay | Admitting: Pharmacy Technician

## 2022-06-19 DIAGNOSIS — Z596 Low income: Secondary | ICD-10-CM

## 2022-06-19 NOTE — Progress Notes (Signed)
[facility])                                            [place] Team    06/19/2022  James Hood 1940-10-13 [id number]  Care coordination call placed to [name] in regard to [place] application.   Spoke to [name]  who informs patient is APPROVED 06/05/22-05/21/23. Medication will automatically fill and ship to provider's address on file based on last fill date in 2023/2024. Patient may call [place] at [phone] if shipment has not arrived and patient does not have sufficient supply.  [name] [name], [place]  [phone]

## 2022-06-21 ENCOUNTER — Telehealth: Payer: Self-pay | Admitting: Family Medicine

## 2022-06-21 ENCOUNTER — Other Ambulatory Visit: Payer: Self-pay | Admitting: Family Medicine

## 2022-06-21 DIAGNOSIS — E782 Mixed hyperlipidemia: Secondary | ICD-10-CM

## 2022-06-21 NOTE — Telephone Encounter (Signed)
Copied from [place] [phone]. Topic: General - Inquiry >> Jun 21, 2022  8:39 AM Erskine Squibb wrote: Reason for CRM: [name] with [facility] [employer] called in stating they received a prior auth form but no notes to back it up stating why the provider is wanting this type of injection done. [name] states any office notes, labs or testings would be most beneficial and helping to get this approved. She states she needs it as soon as possible or it will need to go to clinical review. She says please use reference #[id number]/Additional clinical attn:[name].  And fax to [phone].

## 2022-06-21 NOTE — Telephone Encounter (Signed)
Called to let pt know patient assistance medication had not been picked up.  He reported that he would pick up today or tomorrow.

## 2022-06-21 NOTE — Telephone Encounter (Signed)
Clinical notes and MRI of ankle faxed.

## 2022-06-22 ENCOUNTER — Telehealth: Payer: Self-pay

## 2022-06-22 NOTE — Telephone Encounter (Signed)
PRP was denied. [name] with [employer] would like to speak with Dr Zigmund Daniel nurse. Callback number is [phone].  James Hood

## 2022-06-22 NOTE — Telephone Encounter (Signed)
Copied from [place] [id number]. Topic: General - Other >> Jun 22, 2022  8:28 AM Ludger Nutting wrote: Jackelyn Poling with [employer] called to discuss the prior authorization for the injection. PA is pending denial.

## 2022-06-22 NOTE — Telephone Encounter (Signed)
Received fax. Fax in is Dr. Zigmund Daniel box.  KP

## 2022-06-25 NOTE — Telephone Encounter (Signed)
Pt picked up medication.

## 2022-06-26 ENCOUNTER — Telehealth: Payer: Self-pay | Admitting: Family Medicine

## 2022-06-26 DIAGNOSIS — R1310 Dysphagia, unspecified: Secondary | ICD-10-CM | POA: Diagnosis not present

## 2022-06-26 DIAGNOSIS — R1013 Epigastric pain: Secondary | ICD-10-CM | POA: Diagnosis not present

## 2022-06-26 DIAGNOSIS — R197 Diarrhea, unspecified: Secondary | ICD-10-CM | POA: Diagnosis not present

## 2022-06-26 NOTE — Telephone Encounter (Signed)
Patient called back and I informed him of his sleep study results and he understood.  Patient stated that he has been having some SOB that comes and goes and his wife has been concerned.  Patient is scheduled to see the provider for an evaluation of this tomorrow.  Sebastain Fishbaugh,cma

## 2022-06-26 NOTE — Telephone Encounter (Signed)
Pt returned Gae Bon call. Transferred.

## 2022-06-26 NOTE — Telephone Encounter (Signed)
LVM for patient to call back for results. James Hood,cma

## 2022-06-26 NOTE — Telephone Encounter (Signed)
Please let the patient know that his sleep study did not reveal any sleep apnea though it did reveal low oxygen levels while ambulating.  I am signing in order for him to have oxygen to use while he is ambulating.  I do not have a reason why his oxygen would drop like this at this time.  Please get him scheduled to see me for evaluation of this issue.  Please see if he has had any recent increase in shortness of breath or other breathing issues.

## 2022-06-27 ENCOUNTER — Ambulatory Visit (HOSPITAL_COMMUNITY): Payer: PPO | Admitting: Licensed Clinical Social Worker

## 2022-06-27 ENCOUNTER — Ambulatory Visit (INDEPENDENT_AMBULATORY_CARE_PROVIDER_SITE_OTHER): Payer: PPO | Admitting: Family Medicine

## 2022-06-27 ENCOUNTER — Telehealth: Payer: Self-pay | Admitting: Family Medicine

## 2022-06-27 VITALS — BP 100/60 | HR 75 | Temp 97.7°F | Ht 70.0 in | Wt 163.4 lb

## 2022-06-27 DIAGNOSIS — R0902 Hypoxemia: Secondary | ICD-10-CM

## 2022-06-27 DIAGNOSIS — M19072 Primary osteoarthritis, left ankle and foot: Secondary | ICD-10-CM | POA: Diagnosis not present

## 2022-06-27 NOTE — Assessment & Plan Note (Signed)
Patient will follow-up with sports medicine.  I advised that I can check with some of our sports medicine colleagues to get their input on the success rate of platelet rich plasma injections.

## 2022-06-27 NOTE — Progress Notes (Signed)
Tommi Rumps, MD Phone: [phone]  CHAWN SPRAGGINS is a 82 y.o. male who presents today for f/u.  Hypoxia: Patient had a recent sleep study that did not reveal sleep apnea though did reveal hypoxia down to 88% on ambulation.  He notes no chest pain or shortness of breath.  He notes every once in a while he feels the need to take a deep breath.  He said no swelling.  He he reports that he only wore the device while he was asleep so he is not sure how they got the ambulatory oxygenation status.  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  He notes he saw sports medicine in [place] for this and notes they talked about doing platelet rich plasma injections or surgery.  He wonders about the efficacy of the platelet rich plasma injections.  Social History   Tobacco Use  Smoking Status Every Day   Packs/day: 0.50   Years: 60.00   Total pack years: 30.00   Types: Cigarettes  Smokeless Tobacco Never    Current Outpatient Medications on File Prior to Visit  Medication Sig Dispense Refill   aspirin EC 81 MG tablet Take 81 mg by mouth daily.     buPROPion (WELLBUTRIN XL) 300 MG 24 hr tablet Take 1 tablet (300 mg total) by mouth daily. 90 tablet 1   cholestyramine (QUESTRAN) 4 g packet Take 1 packet by mouth 2 (two) times daily.     Continuous Blood Gluc Sensor (FREESTYLE LIBRE 14 DAY SENSOR) MISC APPLY 1 SENSOR EVERY 14 DAYS TO MONITOR BLOOD GLUCOSE LEVELS. REMOVE OLD SENSOR BEFORE APPLYING NEW SENSOR 2 each 1   Continuous Blood Gluc Sensor (FREESTYLE LIBRE 14 DAY SENSOR) MISC APPLY 1 SENSOR EVERY 14 DAYS TO MONITOR BLOOD GLUCOSE LEVELS. REMOVE OLD SENSOR BEFORE APPLYING NEW SENSOR 2 each 5   dapagliflozin propanediol (FARXIGA) 10 MG TABS tablet Take 1 tablet (10 mg total) by mouth daily. 90 tablet 3   diphenoxylate-atropine (LOMOTIL) 2.5-0.025 MG tablet Take by mouth.     divalproex (DEPAKOTE ER) 500 MG 24 hr tablet Take 1 tablet by mouth 2 (two) times daily.      donepezil (ARICEPT) 10 MG tablet Take 10 mg by mouth at bedtime.     fenofibrate (TRICOR) 145 MG tablet TAKE 1 TABLET BY MOUTH DAILY 90 tablet 1   gabapentin (NEURONTIN) 600 MG tablet TAKE TWO TABLETS BY MOUTH THREE TIMES A DAY 180 tablet 1   HYDROcodone-acetaminophen (NORCO) 10-325 MG tablet Take 1-2 tablets by mouth every 6 (six) hours as needed for severe pain. Must last 30 days. 150 tablet 0   [START ON 07/24/2022] HYDROcodone-acetaminophen (NORCO) 10-325 MG tablet Take 1-2 tablets by mouth every 6 (six) hours as needed for severe pain. Must last 30 days. 150 tablet 0   lisinopril-hydrochlorothiazide (ZESTORETIC) 20-25 MG tablet TAKE ONE TABLET BY MOUTH TWICE A DAY 180 tablet 1   memantine (NAMENDA) 10 MG tablet Take 1 tablet by mouth daily.     metFORMIN (GLUCOPHAGE-XR) 500 MG 24 hr tablet TAKE ONE TABLET BY MOUTH TWICE A DAY 180 tablet 1   metoprolol succinate (TOPROL-XL) 25 MG 24 hr tablet Take 25 mg by mouth daily.     Multiple Vitamins-Minerals (CENTRUM SILVER PO) Take 1 tablet by mouth daily.     omega-3 acid ethyl esters (LOVAZA) 1 g capsule TAKE TWO CAPSULES BY MOUTH TWICE A DAY 360 capsule 3   omeprazole (PRILOSEC) 20 MG capsule Take 20 mg by mouth  daily.     pramipexole (MIRAPEX) 0.125 MG tablet TAKE 1 TABLET BY MOUTH DAILY TAKE 2 TO 3 HOURS PRIOR TO BEDTIME 90 tablet 0   primidone (MYSOLINE) 50 MG tablet Take 150-250 mg by mouth 2 (two) times daily. Take 250 mg by mouth in the morning & take 150 mg at night.     QUEtiapine (SEROQUEL) 100 MG tablet Take 125 mg by mouth at bedtime.     rosuvastatin (CRESTOR) 40 MG tablet Take 1 tablet (40 mg total) by mouth daily. 90 tablet 3   Semaglutide, 1 MG/DOSE, (OZEMPIC, 1 MG/DOSE,) 4 MG/3ML SOPN Inject 1 mg into the skin once a week. 3 mL 3   tamsulosin (FLOMAX) 0.4 MG CAPS capsule TAKE 2 CAPSULES BY MOUTH DAILY AFTER SUPPER 180 capsule 0   traZODone (DESYREL) 100 MG tablet Take 2 tablets (200 mg total) by mouth at bedtime as needed for sleep.  180 tablet 0   vitamin C (ASCORBIC ACID) 500 MG tablet Take 500 mg by mouth daily.     No current facility-administered medications on file prior to visit.     ROS see history of present illness  Objective  Physical Exam Vitals:   06/27/22 1344  BP: 100/60  Pulse: 75  Temp: 97.7 F (36.5 C)  SpO2: 94%    BP Readings from Last 3 Encounters:  06/27/22 100/60  05/24/22 130/70  05/17/22 104/65   Wt Readings from Last 3 Encounters:  06/27/22 163 lb 6.4 oz (74.1 kg)  05/24/22 161 lb 3.2 oz (73.1 kg)  05/17/22 165 lb (74.8 kg)    Physical Exam Constitutional:      General: He is not in acute distress.    Appearance: He is not diaphoretic.  Cardiovascular:     Rate and Rhythm: Normal rate and regular rhythm.     Heart sounds: Normal heart sounds.  Pulmonary:     Effort: Pulmonary effort is normal.     Breath sounds: Normal breath sounds.  Skin:    General: Skin is warm and dry.  Neurological:     Mental Status: He is alert.    6-minute walk test: Patient started at 97% and dropped to 90% with no shortness of breath  Assessment/Plan: Please see individual problem list.  Hypoxia Assessment & Plan: Noted on sleep study testing though was with ambulation.  Patient notes he did not ambulate during his sleep study testing.  We did a 6-minute walk test today and he dropped down to 90%.  His sleep study documentation had been sent off for scan and is not accessible at this time.  We will request another report on this just to make sure that his oxygen was dropping below 90%.  If that is confirmed we may need to consider pulmonology evaluation or lung function testing.  Though given that his oxygenation only went down to 90% today we will hold off on any further evaluation.   Primary osteoarthritis of left ankle Assessment & Plan: Patient will follow-up with sports medicine.  I advised that I can check with some of our sports medicine colleagues to get their input on the  success rate of platelet rich plasma injections.      Return if symptoms worsen or fail to improve.   Tommi Rumps, MD [place]

## 2022-06-27 NOTE — Telephone Encounter (Signed)
Please let the patient know that I reviewed his sleep study again.  The person that brought the sleep study in for me to review was not the person that did the test with him.  The sleep study documentation did show that on a 6-minute walk test his oxygen dropped to 88%.  The patient noted that this did not happen during his sleep study testing.  Given that he does not remember this occurring we can go off of our in office 6-minute walk test and the patient would not qualify for oxygen.  Please see if we can cancel the order with [employer].  Please also let him know his oxygenation was okay while he was sleeping.  Thanks.

## 2022-06-27 NOTE — Telephone Encounter (Signed)
Patient seen in the office today.

## 2022-06-27 NOTE — Assessment & Plan Note (Addendum)
Noted on sleep study testing though was with ambulation.  Patient notes he did not ambulate during his sleep study testing.  We did a 6-minute walk test today and he dropped down to 90%.  His sleep study documentation had been sent off for scan and is not accessible at this time.  We will request another report on this just to make sure that his oxygen was dropping below 90%.  If that is confirmed we may need to consider pulmonology evaluation or lung function testing.  Though given that his oxygenation only went down to 90% today we will hold off on any further evaluation.

## 2022-06-28 NOTE — Telephone Encounter (Signed)
The provider I checked with on the PRP injections noted they had several patients that have responded well to this treatment. I would recommend that the patient discuss further with the physician that will be doing the injection to fully determine if he wants to proceed with this.

## 2022-06-28 NOTE — Telephone Encounter (Signed)
I called the patient and informed him that he is not needing oxygen at this time and I informed him that his oxygen level was okay at sleep as well and he understood.  Patient wanted me to remind the provider of an injection they talked about with orthopedics at his last visit.  He stated the provider would know what he is speaking of.  James Hood,cma

## 2022-07-02 NOTE — Telephone Encounter (Signed)
Pt aware of Dr. Ellen Henri recommendation to reach out to the physician that would be doing the injection.

## 2022-07-05 ENCOUNTER — Ambulatory Visit (INDEPENDENT_AMBULATORY_CARE_PROVIDER_SITE_OTHER): Payer: PPO | Admitting: Family Medicine

## 2022-07-05 ENCOUNTER — Encounter: Payer: Self-pay | Admitting: Family Medicine

## 2022-07-05 VITALS — BP 112/76 | HR 78 | Ht 70.0 in | Wt 163.0 lb

## 2022-07-05 DIAGNOSIS — M19072 Primary osteoarthritis, left ankle and foot: Secondary | ICD-10-CM

## 2022-07-05 DIAGNOSIS — M7671 Peroneal tendinitis, right leg: Secondary | ICD-10-CM | POA: Diagnosis not present

## 2022-07-05 DIAGNOSIS — M958 Other specified acquired deformities of musculoskeletal system: Secondary | ICD-10-CM | POA: Diagnosis not present

## 2022-07-10 ENCOUNTER — Ambulatory Visit: Payer: PPO | Attending: Family Medicine

## 2022-07-10 DIAGNOSIS — M6281 Muscle weakness (generalized): Secondary | ICD-10-CM | POA: Insufficient documentation

## 2022-07-10 DIAGNOSIS — M19072 Primary osteoarthritis, left ankle and foot: Secondary | ICD-10-CM | POA: Diagnosis not present

## 2022-07-10 DIAGNOSIS — M7671 Peroneal tendinitis, right leg: Secondary | ICD-10-CM | POA: Insufficient documentation

## 2022-07-10 DIAGNOSIS — R262 Difficulty in walking, not elsewhere classified: Secondary | ICD-10-CM | POA: Insufficient documentation

## 2022-07-10 DIAGNOSIS — M25572 Pain in left ankle and joints of left foot: Secondary | ICD-10-CM | POA: Diagnosis not present

## 2022-07-10 DIAGNOSIS — M958 Other specified acquired deformities of musculoskeletal system: Secondary | ICD-10-CM | POA: Insufficient documentation

## 2022-07-10 NOTE — Therapy (Signed)
OUTPATIENT PHYSICAL THERAPY LOWER EXTREMITY EVALUATION   Patient Name: James Hood MRN: [id number] DOB:1940-08-21, 82 y.o., male Today's Date: 07/10/2022  END OF SESSION:  PT End of Session - 07/10/22 0820     Visit Number 1    Number of Visits 17    Date for PT Re-Evaluation 09/04/22    PT Start Time 0831    PT Stop Time 0915    PT Time Calculation (min) 44 min    Activity Tolerance Patient tolerated treatment well    Behavior During Therapy [facility] for tasks assessed/performed             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   Colon polyps    Dementia ([place])    Depression    Diabetes mellitus type 2 in nonobese (HCC)    GERD (gastroesophageal reflux disease)    BARRETTS ESOPHAGUS RESOLVED PER PATIENT   Headache    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[place])    History of hiatal hernia    Hypercholesteremia    Hypertension    Hyperthyroidism    Neuropathic pain    Primary localized osteoarthritis of right knee    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   Stomach ulcer    Tremor, essential    Wound infection complicating hardware ([place]) 04/02/2018   Past Surgical History:  Procedure Laterality Date   ANKLE ARTHROSCOPY Left 09/29/2019   Procedure: LEFT ANKLE ARTHROSCOPY, DEBRIDEMENT;  Surgeon: Newt Minion, MD;  Location: [place];  Service: Orthopedics;  Laterality: Left;   BACK SURGERY     COLONOSCOPY WITH PROPOFOL N/A 09/14/2016   Procedure: COLONOSCOPY WITH PROPOFOL;  Surgeon: Manya Silvas, MD;  Location: [facility] ENDOSCOPY;  Service: Endoscopy;  Laterality: N/A;   esophageal stretch     ESOPHAGOGASTRODUODENOSCOPY (EGD) WITH PROPOFOL N/A 12/13/2020   Procedure: ESOPHAGOGASTRODUODENOSCOPY (EGD) WITH PROPOFOL;  Surgeon: Lesly Rubenstein, MD;  Location: [facility] ENDOSCOPY;  Service: Endoscopy;   Laterality: N/A;  IDDM   JOINT REPLACEMENT Right 2016   knee   LUMBAR LAMINECTOMY/DECOMPRESSION MICRODISCECTOMY Left 02/03/2016   Procedure: LEFT L5-S1 DISKECTOMY;  Surgeon: Leeroy Cha, MD;  Location: [facility] NEURO ORS;  Service: Neurosurgery;  Laterality: Left;  LEFT L5-S1 DISKECTOMY   PULSE GENERATOR IMPLANT N/A 08/21/2017   Procedure: UNILATERAL PULSE GENERATOR IMPLANT;  Surgeon: Meade Maw, MD;  Location: [facility] ORS;  Service: Neurosurgery;  Laterality: N/A;   PULSE GENERATOR IMPLANT Right 04/02/2018   Procedure: REMOVAL OF PULSE GENERATOR IMPLANT AND LEADS;  Surgeon: Meade Maw, MD;  Location: [facility] ORS;  Service: Neurosurgery;  Laterality: Right;   TONSILLECTOMY     TOTAL KNEE ARTHROPLASTY Right 11/15/2014   Procedure: TOTAL KNEE ARTHROPLASTY;  Surgeon: Elsie Saas, MD;  Location: [facility];  Service: Orthopedics;  Laterality: Right;   Patient Active Problem List   Diagnosis Date Noted   Hypoxia 06/27/2022   Hypersomnia 05/24/2022   Chronic cough 11/10/2021   Pressure injury of buttock, stage 1 08/08/2021   RLS (restless legs syndrome) 06/26/2021   Bilateral leg pain 06/26/2021   Primary osteoarthritis of left shoulder 01/25/2021   Chronic, continuous use of opioids 11/15/2020   Therapeutic opioid-induced constipation (OIC) 11/15/2020   Fatigue 10/11/2020   OAB (overactive bladder) 08/08/2020   Prolonged Q-T interval on ECG 06/08/2020   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  Osteochondral defect of ankle    Chronic pain of left ankle 02/27/2019   Vitamin D deficiency 02/18/2019   Chronic left shoulder pain 02/02/2019   Neuropathy 09/24/2018   Lumbosacral radiculopathy 01/14/2018   Primary osteoarthritis of left ankle 01/14/2018   Lumbar facet joint syndrome 01/14/2018   Chronic pain syndrome 08/26/2017   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   Thyroid nodule 02/27/2017   Fatty liver 02/27/2017   Right hip pain 08/16/2016   Lumbar herniated disc 02/03/2016   Anemia  12/09/2015   Falls 09/16/2015   Barrett's esophagus 09/16/2015   Encounter for general adult medical examination with abnormal findings 09/16/2015   Benign prostatic hyperplasia 09/16/2015   Trochanteric bursitis of left hip 09/09/2015   Difficulty in walking 06/01/2015   HLD (hyperlipidemia) 02/23/2015   DJD (degenerative joint disease) of knee 11/15/2014   Hypertension    Tremor, essential    Primary localized osteoarthritis of right knee    Anxiety and depression    Tobacco abuse 11/18/2013   Chronic diarrhea 11/12/2013   Benign neoplasm of colon 08/25/2013   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 male referred to [facility] for L ankle pain.  PERTINENT HISTORY: Pt is a 82 y.o. male referred to OPPT for L ankle pain. Pt reports seeing Dr. Zigmund Daniel planning for L ankle injections trying to avoid surgical intervention. Has yet to have any steroid injections, planning for one this upcoming Wednesday, 07/18/22. Pt's pain described as achey, radiates up to his knee along the lateral portion. Worst pain described upwards to 7-8/10 NPS. Best pain 7-8/10 NPS. Currently 5/10 NPS. Typically pain more mild in the mornings as he has not walked a lot. Pain is located at lateral malleolus, slightly inferior and lateral. Pain is worsened with standing and walking. Can tolerate about 5 minutes or less of both before onset of  significant pain. 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  PAIN:  Are you having pain? Yes: NPRS scale: 5/10 Pain location: L ankle  Pain description: dull, achey Aggravating factors: standing/walker Relieving factors: N/A, maybe rest  PRECAUTIONS: None  WEIGHT BEARING RESTRICTIONS: No  FALLS:  Has patient fallen in last 6 months? Yes. Number of falls 1, tripped on steps going up  LIVING ENVIRONMENT: Lives with: lives with their family and lives with their spouse Lives in: House/apartment Stairs: Yes: External: 3 steps; on left going up Has following equipment at home: Single point cane and Walker - 2 wheeled  OCCUPATION: Retired, helps with Occupational psychologist.   PLOF: Independent  PATIENT GOALS: Wants to strengthen L ankle, improve pain. Be able to stand and walk for longer periods of time.   NEXT MD VISIT: 07/18/22   OBJECTIVE:   DIAGNOSTIC FINDINGS: CLINICAL DATA:  Ankle pain, chronic, osteoarthritis suspected.   EXAM: MRI OF THE LEFT ANKLE WITHOUT CONTRAST   TECHNIQUE: Multiplanar, multisequence  MR imaging of the ankle was performed. No intravenous contrast was administered.   COMPARISON:  Foot radiographs dated December 14, 2021   FINDINGS: TENDONS   Peroneal: Peroneal longus tendon intact. Peroneal brevis intact.   Posteromedial: Posterior tibial tendon intact. Flexor hallucis longus tendon intact. Flexor digitorum longus tendon intact.   Anterior: Tibialis anterior tendon intact. Extensor hallucis longus tendon intact Extensor digitorum longus tendon intact.   Achilles:  Intact.   Plantar Fascia: Intact.   LIGAMENTS   Lateral: Anterior talofibular ligament intact. Calcaneofibular ligament intact. Posterior talofibular ligament intact. Anterior and posterior tibiofibular ligaments intact.   Medial: Deltoid ligament intact. Spring ligament intact.   CARTILAGE    Ankle Joint: Small joint effusion. Normal ankle mortise. Osteochondral injury about the medial aspect of the talar dome with subchondral cyst.   Subtalar Joints/Sinus Tarsi: Normal subtalar joints. No subtalar joint effusion. Normal sinus tarsi.   Bones: No marrow signal abnormality. No fracture or dislocation. 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 Tarsal bones are in normal alignment.   Soft Tissue: Soft tissue edema about the lateral aspect of the foot. No fluid collection or hematoma. Muscles are normal without edema or atrophy. Tarsal tunnel is normal.   IMPRESSION: 1. 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   2.  Ligaments and tendons are intact.   3. 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 Subtalar joints are unremarkable.   4. Mild subcutaneous soft tissue edema. No fluid collection or hematoma. No evidence of muscle strain.     Electronically Signed   By: Keane Police D.O.   On: 02/05/2022 22:43    PATIENT SURVEYS:  FOTO 42/52  COGNITION: Overall cognitive status: Within functional limits for tasks assessed     SENSATION: WFL  EDEMA:  None present   POSTURE: weight shift right in standing to offload LLE. Chronic R knee flexion due to limited TKE.  PALPATION: TTP at L ankle along inferior pole of lateral malleolus. TTP also along talar dome.  LOWER EXTREMITY ROM:  Active ROM Right eval Left eval  Hip flexion    Hip extension    Hip abduction    Hip adduction    Hip internal rotation    Hip external rotation    Knee flexion 112 124  Knee extension 11 2  Ankle dorsiflexion 15 10  Ankle plantarflexion 25 8  Ankle inversion 20 NV  Ankle eversion 22 NV   (Blank rows = not tested)  Passive ROM Right eval Left eval  Hip flexion    Hip extension    Hip abduction    Hip adduction    Hip internal rotation    Hip external  rotation    Knee flexion    Knee extension    Ankle dorsiflexion  15  Ankle plantarflexion    Ankle inversion    Ankle eversion     (Blank rows = not tested)  LOWER EXTREMITY MMT:  MMT Right eval Left eval  Hip flexion 4 4  Hip extension    Hip abduction    Hip adduction    Hip internal rotation 4 4  Hip external rotation 3+ 3+  Knee flexion 5 4  Knee extension 5 5  Ankle dorsiflexion 4 4-  Ankle plantarflexion 5 4  Ankle inversion    Ankle eversion     (Blank rows = not tested)  LOWER EXTREMITY SPECIAL TESTS:  Not indicated  JOINT MOBILITY: Ankle AP/PA talotibial mobility hypomobile in BLE's. Concordant symptoms with firm end feel at end range AP and PA   FUNCTIONAL TESTS:  2 minute walk test: 256'. Normative data for community dwelling older adults is 496'. 10 meter walk test: .55 m/s  Stairs: Alternating step through pattern asc and desc with B hand rail use.   Squat: Excellent CKC ankle DF bilaterally. Does have narrowed stance, limited hip hinge.   GAIT: Distance walked: 10 meters Assistive device utilized: Single point cane Level of assistance: SBA Comments: Pt ambulates with very rigid gait that is antalgic on LLE. Limited hip extension at push off bilaterally and L lateral lean on LLE during stance phase indicative of glut med weakness.    TODAY'S TREATMENT:                                                                                                                              DATE: next session    PATIENT EDUCATION:  Education details: Prognosis, POC Person educated: Patient Education method: Explanation Education comprehension: verbalized understanding  HOME EXERCISE PROGRAM: Deferred to next session  ASSESSMENT:  CLINICAL IMPRESSION: Patient is a 82 y.o. male who was seen today for physical therapy evaluation and treatment for L ankle pain. Pt presents with deficits in L ankle pain, ankle AROM, and LE weakness. Pt presents with limited  ankle PF > DF AROM, R knee extension AROM impairment and LLE hip weakness leading to functional impairments in decreased 2 minute walk test to age matched norms and decreased gait speed below household norms. These impairments increase pt's risk for falls due to slowed speed and distance but also due to pain in weightbearing leading to gait compensations and abnormalities. This is limiting pt's ability to perform community standing and walking distances to complete volunteer/job tasks and ADL's. Pt will benefit from skilled PT services to address these impairments to optimize return to PLOF.   OBJECTIVE IMPAIRMENTS: Abnormal gait, decreased activity tolerance, decreased balance, decreased mobility, difficulty walking, decreased ROM, decreased strength, hypomobility, impaired flexibility, improper body mechanics, postural dysfunction, and pain.   ACTIVITY LIMITATIONS: carrying, sitting, standing, squatting, stairs, and locomotion level  PARTICIPATION LIMITATIONS: shopping, community activity, and occupation  PERSONAL FACTORS: Age, Past/current experiences, Time since onset of injury/illness/exacerbation, and 3+ comorbidities: Anxiety/depression, dementia, DM type 2, HTN, R knee OA/ TKA, CPS, B foot neuropathy  are also affecting patient's functional outcome.   REHAB POTENTIAL: Fair Chronicity of symptoms, age  CLINICAL DECISION MAKING: Evolving/moderate complexity  EVALUATION COMPLEXITY: Moderate   GOALS: Goals reviewed with patient? No  SHORT TERM GOALS: Target date: 08/07/22 Pt will be independent with HEP to improve R knee, L ankle impairments to improve gait mechanics for improved tolerance for community distances.  Baseline:07/10/22: Deferred to next session Goal status: INITIAL  LONG TERM GOALS: Target date: 09/04/22  Pt will improve FOTO to target score or greater to demonstrate clinically significant improvement in functional mobility.  Baseline: 07/10/22: 42 with target of 52 Goal  status: INITIAL  2.  Pt will improve 2 MWT to norms for community dwelling older adults with </= 5/10 pain in L ankle with gait to demonstrate clinically significant improvement to complete community distances with less pain. Baseline: 07/10/22: 256' with 7-8/10 ankle pain. Norm for 2 MWT is 496'. Goal status: INITIAL  3.  Pt will improve L ankle DF AROM by at least 5 degrees to improve L ankle mobility for improved swing phase in gait for falls risk reduction and improved L ankle pain in L ankle OA.  Baseline: 07/10/22: 10 degrees Goal status: INITIAL  4.  Pt will improve R knee AROM TKE by at least 5 degrees to assist in normalized gait mechanics to assist in offloading L ankle in weightbearing positions to decrease L ankle pain Baseline: 07/10/22: Rigid gait, Increased weight shift onto LLE in stance phase with L lateral lean due to R knee extension limitations. Lacking 11 degrees R knee from TKE. Goal status: INITIAL  PLAN:  PT FREQUENCY: 2x/week  PT DURATION: 8 weeks  PLANNED INTERVENTIONS: Therapeutic exercises, Therapeutic activity, Neuromuscular re-education, Balance training, Gait training, Patient/Family education, Joint mobilization, Aquatic Therapy, Dry Needling, Cryotherapy, Moist heat, Manual therapy, and Re-evaluation  PLAN FOR NEXT SESSION: Develop HEP   Salem Caster. Fairly IV, PT, DPT Physical Therapist- [facility]  07/10/2022, 11:53 AM

## 2022-07-11 ENCOUNTER — Ambulatory Visit (INDEPENDENT_AMBULATORY_CARE_PROVIDER_SITE_OTHER): Payer: PPO

## 2022-07-11 ENCOUNTER — Ambulatory Visit (INDEPENDENT_AMBULATORY_CARE_PROVIDER_SITE_OTHER): Payer: PPO | Admitting: Family Medicine

## 2022-07-11 ENCOUNTER — Encounter: Payer: Self-pay | Admitting: Family Medicine

## 2022-07-11 VITALS — BP 132/84 | HR 85 | Temp 97.5°F | Ht 70.0 in | Wt 164.0 lb

## 2022-07-11 DIAGNOSIS — R200 Anesthesia of skin: Secondary | ICD-10-CM | POA: Insufficient documentation

## 2022-07-11 DIAGNOSIS — M542 Cervicalgia: Secondary | ICD-10-CM | POA: Diagnosis not present

## 2022-07-11 DIAGNOSIS — R202 Paresthesia of skin: Secondary | ICD-10-CM

## 2022-07-11 DIAGNOSIS — M19072 Primary osteoarthritis, left ankle and foot: Secondary | ICD-10-CM

## 2022-07-11 NOTE — Progress Notes (Signed)
[place] MD/PA/NP OP Progress Note  07/16/2022 8:36 AM AYVAN STOCKHAUSEN  MRN:  [id number]  Chief Complaint:  Chief Complaint  Patient presents with   Follow-up   HPI:  This is a follow-up appointment for depression and insomnia.  He states that he struggles with left ankle pain.  It has no cartilage, he has constant pain, and it has been getting worse.  He has a appointment in a few days, and is planning to get cortisone shot under radiology.  He takes hydrocodone for pain.  He is open to discuss surgery options.  He states that it is her to escape from the pain.  He is a worried about the country due to political issues.  He does not think there is anything he can do except to bote for the [employer].  He talks about his stepson's wife, who is far left liberal. She would not bring her children to them as it would be "bad" to be exposed to republican.  He is also concerned about his stepson, who does not have respect to his mother. The patient has mood symptoms as in PHQ-9/GAD-7.  Although he feels down at times, it does not affect him for the rest of the day.  He sleeps well with the current medication regimen most of the time.  He denies SI.  He denies fall. He had a dizziness this morning, which he attributes to insomnia the previous night.  Daily routine: wakes up at 7 am, watch TV, goes out to [employer] meeting in person (35 years of membership), sleeps 8-9 pm,  Exercise: (stays in the house) Employment: retired at 82 yo. He used to work as a Manufacturing systems engineer Household: wife Marital status: 95 years with his second wife, divorced before Number of children: 1 child, 2 grandchildren, 3 step children Education: MS in Psychologist, educational Readings from Last 3 Encounters:  07/16/22 165 lb (74.8 kg)  07/11/22 164 lb (74.4 kg)  07/05/22 163 lb (73.9 kg)    Visit Diagnosis:    ICD-10-CM   1. MDD (major depressive disorder), recurrent, in partial remission ([place])  F33.41     2.  Insomnia, unspecified type  G47.00       Past Psychiatric History: Please see initial evaluation for full details. I have reviewed the history. No updates at this time.     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   Colon polyps    Dementia ([place])    Depression    Diabetes mellitus type 2 in nonobese (HCC)    GERD (gastroesophageal reflux disease)    BARRETTS ESOPHAGUS RESOLVED PER PATIENT   Headache    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[place])    History of hiatal hernia    Hypercholesteremia    Hypertension    Hyperthyroidism    Neuropathic pain    Primary localized osteoarthritis of right knee    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   Stomach ulcer    Tremor, essential    Wound infection complicating hardware ([place]) 04/02/2018    Past Surgical History:  Procedure Laterality Date   ANKLE ARTHROSCOPY Left 09/29/2019   Procedure: LEFT ANKLE ARTHROSCOPY, DEBRIDEMENT;  Surgeon: Newt Minion, MD;  Location: [place];  Service: Orthopedics;  Laterality: Left;   BACK SURGERY     COLONOSCOPY WITH PROPOFOL N/A 09/14/2016   Procedure: COLONOSCOPY WITH PROPOFOL;  Surgeon: Manya Silvas, MD;  Location[facility] ENDOSCOPY;  Service: Endoscopy;  Laterality: N/A;   esophageal stretch     ESOPHAGOGASTRODUODENOSCOPY (EGD) WITH PROPOFOL N/A 12/13/2020   Procedure: ESOPHAGOGASTRODUODENOSCOPY (EGD) WITH PROPOFOL;  Surgeon: Lesly Rubenstein, MD;  Location: [facility] ENDOSCOPY;  Service: Endoscopy;  Laterality: N/A;  IDDM   JOINT REPLACEMENT Right 2016   knee   LUMBAR LAMINECTOMY/DECOMPRESSION MICRODISCECTOMY Left 02/03/2016   Procedure: LEFT L5-S1 DISKECTOMY;  Surgeon: Leeroy Cha, MD;  Location: [facility] NEURO ORS;  Service: Neurosurgery;  Laterality: Left;  LEFT L5-S1 DISKECTOMY   PULSE GENERATOR IMPLANT N/A  08/21/2017   Procedure: UNILATERAL PULSE GENERATOR IMPLANT;  Surgeon: Meade Maw, MD;  Location: [facility] ORS;  Service: Neurosurgery;  Laterality: N/A;   PULSE GENERATOR IMPLANT Right 04/02/2018   Procedure: REMOVAL OF PULSE GENERATOR IMPLANT AND LEADS;  Surgeon: Meade Maw, MD;  Location: [facility] ORS;  Service: Neurosurgery;  Laterality: Right;   TONSILLECTOMY     TOTAL KNEE ARTHROPLASTY Right 11/15/2014   Procedure: TOTAL KNEE ARTHROPLASTY;  Surgeon: Elsie Saas, MD;  Location: [place];  Service: Orthopedics;  Laterality: Right;    Family Psychiatric History: Please see initial evaluation for full details. I have reviewed the history. No updates at this time.     Family History:  Family History  Problem Relation Age of Onset   Heart attack Mother    Heart disease Mother    Hypertension Father    Depression Father    Kidney cancer Neg Hx    Prostate cancer Neg Hx     Social History:  Social History   Socioeconomic History   Marital status: Married    Spouse name: Not on file   Number of children: Not on file   Years of education: Not on file   Highest education level: Not on file  Occupational History   Not on file  Tobacco Use   Smoking status: Every Day    Packs/day: 0.50    Years: 60.00    Total pack years: 30.00    Types: Cigarettes   Smokeless tobacco: Never  Vaping Use   Vaping Use: Former  Substance and Sexual Activity   Alcohol use: Not Currently    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   Drug use: Never   Sexual activity: Not Currently  Other Topics Concern   Not on file  Social History Narrative   Not on file   Social Determinants of Health   Financial Resource Strain: Low Risk  (12/29/2021)   Overall Financial Resource Strain (CARDIA)    Difficulty of Paying Living Expenses: Not hard at all  Food Insecurity: No Food Insecurity (12/29/2021)   Hunger Vital Sign    Worried About Running Out of Food in the Last Year: Never true    [place] in the Last Year: Never true  Transportation Needs: No Transportation Needs (12/29/2021)   PRAPARE - Hydrologist (Medical): No    Lack of Transportation (Non-Medical): No  Physical Activity: Insufficiently Active (12/30/2019)   Exercise Vital Sign    Days of Exercise per Week: 3 days    Minutes of Exercise per Session: 40 min  Stress: No Stress Concern Present (12/29/2021)   [place]    Feeling of Stress : Not at all  Social Connections: Unknown (12/29/2021)   Social Connection and Isolation Panel [NHANES]    Frequency of Communication with Friends and Family: Not on file    Frequency of Social Gatherings with Friends and Family: Not on file    Attends Religious Services: Not on file    Active Member of Clubs or Organizations: Not on file    Attends Archivist Meetings: Not on file    Marital Status: Married    Allergies: No Known Allergies  Metabolic Disorder Labs: Lab Results  Component Value Date   HGBA1C 6.0 (A) 05/24/2022   MPG 131 01/25/2016   MPG 166 11/05/2014   No results found for: "PROLACTIN" Lab Results  Component Value Date   CHOL 186 08/02/2021   TRIG 304.0 (H) 08/02/2021   HDL 59.10 08/02/2021   CHOLHDL 3 08/02/2021   VLDL 60.8 (H) 08/02/2021   LDLCALC 57 09/25/2017   Lab Results  Component Value Date   TSH 1.08 08/02/2021   TSH 0.97 10/11/2020    Therapeutic Level Labs: No results found for: "LITHIUM" No results found for: "VALPROATE" No results found for: "CBMZ"  Current Medications: Current Outpatient Medications  Medication Sig Dispense Refill   aspirin EC 81 MG tablet Take 81 mg by mouth daily.     cholestyramine (QUESTRAN) 4 g packet Take 1 packet by mouth 2 (two) times daily.     Continuous Blood Gluc Sensor (FREESTYLE LIBRE 14 DAY SENSOR) MISC APPLY 1 SENSOR EVERY 14 DAYS TO MONITOR BLOOD GLUCOSE LEVELS. REMOVE OLD SENSOR BEFORE  APPLYING NEW SENSOR 2 each 1   Continuous Blood Gluc Sensor (FREESTYLE LIBRE 14 DAY SENSOR) MISC APPLY 1 SENSOR EVERY 14 DAYS TO MONITOR BLOOD GLUCOSE LEVELS. REMOVE OLD SENSOR BEFORE APPLYING NEW SENSOR 2 each 5   dapagliflozin propanediol (FARXIGA) 10 MG TABS tablet Take 1 tablet (10 mg total) by mouth daily. 90 tablet 3   diphenoxylate-atropine (LOMOTIL) 2.5-0.025 MG tablet Take by mouth.     divalproex (DEPAKOTE ER) 500 MG 24 hr tablet Take 1 tablet by mouth 2 (two) times daily.     donepezil (ARICEPT) 10 MG tablet Take 10 mg by mouth at bedtime.     fenofibrate (TRICOR) 145 MG tablet TAKE 1 TABLET BY MOUTH DAILY 90 tablet 1   gabapentin (NEURONTIN) 600 MG tablet TAKE TWO TABLETS BY MOUTH THREE TIMES A DAY 180 tablet 1   HYDROcodone-acetaminophen (NORCO) 10-325 MG tablet Take 1-2 tablets by mouth every 6 (six) hours as needed for severe pain. Must last 30 days. 150 tablet 0   [START ON 07/24/2022] HYDROcodone-acetaminophen (NORCO) 10-325 MG tablet Take 1-2 tablets by mouth every 6 (six) hours as needed for severe pain. Must last 30 days. 150 tablet 0   lisinopril-hydrochlorothiazide (ZESTORETIC) 20-25 MG tablet TAKE ONE TABLET BY MOUTH TWICE A DAY 180 tablet 1   memantine (NAMENDA) 10 MG tablet Take 1 tablet by mouth daily.     metFORMIN (GLUCOPHAGE-XR) 500 MG 24 hr tablet TAKE ONE TABLET BY MOUTH TWICE A DAY 180 tablet 1   metoprolol succinate (TOPROL-XL) 25 MG 24 hr tablet Take 25 mg by mouth daily.     Multiple Vitamins-Minerals (CENTRUM SILVER PO) Take 1 tablet by mouth daily.     omega-3 acid ethyl esters (LOVAZA) 1 g capsule TAKE TWO CAPSULES BY MOUTH TWICE A DAY 360 capsule 3   omeprazole (PRILOSEC) 20 MG capsule Take 20 mg by mouth daily.     pramipexole (MIRAPEX) 0.125 MG tablet TAKE 1  TABLET BY MOUTH DAILY TAKE 2 TO 3 HOURS PRIOR TO BEDTIME 90 tablet 0   primidone (MYSOLINE) 50 MG tablet Take 150-250 mg by mouth 2 (two) times daily. Take 250 mg by mouth in the morning & take 150 mg at  night.     QUEtiapine (SEROQUEL) 100 MG tablet Take 125 mg by mouth at bedtime.     rosuvastatin (CRESTOR) 40 MG tablet Take 1 tablet (40 mg total) by mouth daily. 90 tablet 3   Semaglutide, 1 MG/DOSE, (OZEMPIC, 1 MG/DOSE,) 4 MG/3ML SOPN Inject 1 mg into the skin once a week. 3 mL 3   tamsulosin (FLOMAX) 0.4 MG CAPS capsule TAKE 2 CAPSULES BY MOUTH DAILY AFTER SUPPER 180 capsule 0   vitamin C (ASCORBIC ACID) 500 MG tablet Take 500 mg by mouth daily.     [START ON 08/25/2022] buPROPion (WELLBUTRIN XL) 300 MG 24 hr tablet Take 1 tablet (300 mg total) by mouth daily. 90 tablet 1   [START ON 09/03/2022] traZODone (DESYREL) 100 MG tablet Take 2 tablets (200 mg total) by mouth at bedtime as needed for sleep. 180 tablet 0   No current facility-administered medications for this visit.     Musculoskeletal: Strength & Muscle Tone: within normal limits Gait & Station:  uses a cane Patient leans: N/A  Psychiatric Specialty Exam: Review of Systems  Psychiatric/Behavioral: Negative.    All other systems reviewed and are negative.   Blood pressure 129/72, pulse 76, temperature (!) 97.5 F (36.4 C), temperature source Skin, height '5\' 10"'$  (1.778 m), weight 165 lb (74.8 kg).Body mass index is 23.68 kg/m.  General Appearance: Fairly Groomed  Eye Contact:  Fair  Speech:  Clear and Coherent  Volume:  Normal  Mood:   good  Affect:  Appropriate, Congruent, and Full Range  Thought Process:  Coherent  Orientation:  Full (Time, Place, and Person)  Thought Content: Logical   Suicidal Thoughts:  No  Homicidal Thoughts:  No  Memory:  Immediate;   Good  Judgement:  Good  Insight:  Good  Psychomotor Activity:  Normal, Normal tone, no rigidity, no resting/postural tremors, no tardive dyskinesia    Concentration:  Concentration: Good and Attention Span: Good  Recall:  Good  Fund of Knowledge: Good  Language: Good  Akathisia:  No  Handed:  Right  AIMS (if indicated): not done  Assets:  Communication  Skills Desire for Improvement  ADL's:  Intact  Cognition: WNL  Sleep:  Good   Screenings: GAD-7    Flowsheet Row Office Visit from 07/16/2022 in [place] Office Visit from 07/11/2022 in [place] at [facility] Visit from 05/24/2022 in [place] at [facility] Visit from 04/30/2022 in [place] Office Visit from 02/19/2022 in [place]  Total GAD-7 Score 2 0 0 4 4      Mini-Mental    Flowsheet Row Clinical Support from 11/15/2017 in [place] at [facility] from 11/14/2016 in [place] at [facility]  Total Score (max 30 points ) 30 30      PHQ2-9    [place] Visit from 07/16/2022 in [place] Office Visit from 07/11/2022 in [place] at [facility] Visit from 05/24/2022 in [place] at [facility] Visit from 04/30/2022 in [place]  Office Visit from 02/19/2022 in [place]  PHQ-2 Total Score 0 0 0 0 0  PHQ-9 Total Score '2 1 3 '$ -- --      Flowsheet Row Counselor from 05/10/2022 in [place] at [place] [facility] from 02/19/2022 in [place] Counselor from 02/15/2022 in [place] at [facility] No Risk No Risk No Risk        Assessment and Plan:  ONESIMO SAVINO is a 82 y.o. year old male with a history of  pseudo dementia, sleep disorder, tremor, history of QTc prolongation , who presents for follow up appointment for below.   1. MDD (major depressive disorder), recurrent, in partial  remission ([place]) Acute stressors include: ankle pain  Other stressors include: conflict with his step son, financial strain    History:   He denies any significant mood symptoms since our last visit. He continues to actively participate in [place] meetings and stays connected with his family, including his wife.  Will continue bupropion to target depression.   2. Insomnia, unspecified type Improving.  Will continue trazodone as needed for insomnia.  He is informed again regarding its potential risk of drowsiness and fall especially with concomitant use of quetiapine, gabapentin and opioid.    # Alcohol use disorder in sustained remission  History:  He began drinking in college but has been abstinent since the age of 67. He actively participates in [employer]. Although he reports occasional craving, he has been abstinent from alcohol.  Will continue motivational interview.   # History of cognitive impairment Followed-up by neurologist, and has been on memantine and donepezil. 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 Acute risk factors for suicide include: N/A. Protective factors for this patient include: positive social support, responsibility to others (children, family), coping skills and hope for the future. Considering these factors, the overall suicide risk at this point appears to be low.  Patient is appropriate for outpatient follow up.  Collaboration of Care: Collaboration of Care: Other reviewed notes in Epic  Patient/Guardian was advised Release of Information must be obtained prior to any record release in order to collaborate their care with an outside provider. Patient/Guardian was advised if they have not already done so to contact the registration department to sign all necessary forms in order for [place] to release information regarding their care.   Consent: Patient/Guardian gives verbal consent for treatment and assignment of benefits for services provided during this visit. Patient/Guardian expressed understanding and agreed to proceed.    Norman Clay, MD 07/16/2022, 8:36 AM

## 2022-07-11 NOTE — Progress Notes (Signed)
Tommi Rumps, MD Phone: [phone]  James Hood is a 82 y.o. male who presents today for same-day visit.  Left hand numbness: Patient notes he woke up yesterday where the palmar aspect of his fingers in his left hand felt numb.  He notes this is in the thumb, index, middle, and ring finger.  He notes his little finger is spared.  He notes the numbness does not go into his palm.  He notes a little tingling on the tips of his fingers dorsally though otherwise the dorsal aspect of his hand has no numbness.  He notes no numbness elsewhere in his body.  No weakness.  No vision changes.  He notes some trapezius area discomfort when he rotates his neck to the left.  Ankle pain: Patient notes he is getting set up for a steroid shot and physical therapy for this.  He decided against the PRP injections given the cost without guaranteed improvement.  Social History   Tobacco Use  Smoking Status Every Day   Packs/day: 0.50   Years: 60.00   Total pack years: 30.00   Types: Cigarettes  Smokeless Tobacco Never    Current Outpatient Medications on File Prior to Visit  Medication Sig Dispense Refill   aspirin EC 81 MG tablet Take 81 mg by mouth daily.     buPROPion (WELLBUTRIN XL) 300 MG 24 hr tablet Take 1 tablet (300 mg total) by mouth daily. 90 tablet 1   cholestyramine (QUESTRAN) 4 g packet Take 1 packet by mouth 2 (two) times daily.     Continuous Blood Gluc Sensor (FREESTYLE LIBRE 14 DAY SENSOR) MISC APPLY 1 SENSOR EVERY 14 DAYS TO MONITOR BLOOD GLUCOSE LEVELS. REMOVE OLD SENSOR BEFORE APPLYING NEW SENSOR 2 each 1   Continuous Blood Gluc Sensor (FREESTYLE LIBRE 14 DAY SENSOR) MISC APPLY 1 SENSOR EVERY 14 DAYS TO MONITOR BLOOD GLUCOSE LEVELS. REMOVE OLD SENSOR BEFORE APPLYING NEW SENSOR 2 each 5   dapagliflozin propanediol (FARXIGA) 10 MG TABS tablet Take 1 tablet (10 mg total) by mouth daily. 90 tablet 3   diphenoxylate-atropine (LOMOTIL) 2.5-0.025 MG tablet Take by mouth.      divalproex (DEPAKOTE ER) 500 MG 24 hr tablet Take 1 tablet by mouth 2 (two) times daily.     donepezil (ARICEPT) 10 MG tablet Take 10 mg by mouth at bedtime.     fenofibrate (TRICOR) 145 MG tablet TAKE 1 TABLET BY MOUTH DAILY 90 tablet 1   gabapentin (NEURONTIN) 600 MG tablet TAKE TWO TABLETS BY MOUTH THREE TIMES A DAY 180 tablet 1   HYDROcodone-acetaminophen (NORCO) 10-325 MG tablet Take 1-2 tablets by mouth every 6 (six) hours as needed for severe pain. Must last 30 days. 150 tablet 0   [START ON 07/24/2022] HYDROcodone-acetaminophen (NORCO) 10-325 MG tablet Take 1-2 tablets by mouth every 6 (six) hours as needed for severe pain. Must last 30 days. 150 tablet 0   lisinopril-hydrochlorothiazide (ZESTORETIC) 20-25 MG tablet TAKE ONE TABLET BY MOUTH TWICE A DAY 180 tablet 1   memantine (NAMENDA) 10 MG tablet Take 1 tablet by mouth daily.     metFORMIN (GLUCOPHAGE-XR) 500 MG 24 hr tablet TAKE ONE TABLET BY MOUTH TWICE A DAY 180 tablet 1   metoprolol succinate (TOPROL-XL) 25 MG 24 hr tablet Take 25 mg by mouth daily.     Multiple Vitamins-Minerals (CENTRUM SILVER PO) Take 1 tablet by mouth daily.     omega-3 acid ethyl esters (LOVAZA) 1 g capsule TAKE TWO CAPSULES BY MOUTH TWICE  A DAY 360 capsule 3   omeprazole (PRILOSEC) 20 MG capsule Take 20 mg by mouth daily.     pramipexole (MIRAPEX) 0.125 MG tablet TAKE 1 TABLET BY MOUTH DAILY TAKE 2 TO 3 HOURS PRIOR TO BEDTIME 90 tablet 0   primidone (MYSOLINE) 50 MG tablet Take 150-250 mg by mouth 2 (two) times daily. Take 250 mg by mouth in the morning & take 150 mg at night.     QUEtiapine (SEROQUEL) 100 MG tablet Take 125 mg by mouth at bedtime.     rosuvastatin (CRESTOR) 40 MG tablet Take 1 tablet (40 mg total) by mouth daily. 90 tablet 3   Semaglutide, 1 MG/DOSE, (OZEMPIC, 1 MG/DOSE,) 4 MG/3ML SOPN Inject 1 mg into the skin once a week. 3 mL 3   tamsulosin (FLOMAX) 0.4 MG CAPS capsule TAKE 2 CAPSULES BY MOUTH DAILY AFTER SUPPER 180 capsule 0   traZODone  (DESYREL) 100 MG tablet Take 2 tablets (200 mg total) by mouth at bedtime as needed for sleep. 180 tablet 0   vitamin C (ASCORBIC ACID) 500 MG tablet Take 500 mg by mouth daily.     No current facility-administered medications on file prior to visit.     ROS see history of present illness  Objective  Physical Exam Vitals:   07/11/22 1556  BP: 132/84  Pulse: 85  Temp: (!) 97.5 F (36.4 C)  SpO2: 94%    BP Readings from Last 3 Encounters:  07/11/22 132/84  07/05/22 112/76  06/27/22 100/60   Wt Readings from Last 3 Encounters:  07/11/22 164 lb (74.4 kg)  07/05/22 163 lb (73.9 kg)  06/27/22 163 lb 6.4 oz (74.1 kg)    Physical Exam Constitutional:      General: He is not in acute distress.    Appearance: He is not diaphoretic.  Cardiovascular:     Rate and Rhythm: Normal rate and regular rhythm.     Heart sounds: Normal heart sounds.  Pulmonary:     Effort: Pulmonary effort is normal.     Breath sounds: Normal breath sounds.  Musculoskeletal:     Comments: Negative Tinel's and Phalen's bilaterally  Skin:    General: Skin is warm and dry.  Neurological:     Mental Status: He is alert.     Comments: CN 3-12 intact, 5/5 strength in bilateral biceps, triceps, grip, quads, hamstrings, plantar and dorsiflexion, sensation to light touch slightly decreased in the palmar aspect of his left thumb, index finger, middle finger, and ring finger, otherwise sensation to light touch is intact in his left hand and intact in the rest of his left UE as well as the right upper extremity and bilateral LE      Assessment/Plan: Please see individual problem list.  Neck pain -     DG Cervical Spine Complete; Future  Numbness and tingling in left hand Assessment & Plan: Undetermined cause.  I think this would be too focal to be related to his stroke.  He is generally neurologically intact with the exception of the palmar aspect of the described fingers in his left hand.  This could be  nerve impingement from carpal tunnel or nerve impingement in his neck.  Given the discomfort on rotation of his neck we will start with a neck x-ray.  He was advised to seek medical attention if he had any worsening symptoms or any new symptoms.   Primary osteoarthritis of left ankle Assessment & Plan: He will continue to follow with sports  medicine.     Return if symptoms worsen or fail to improve.   Tommi Rumps, MD [place] [facility]

## 2022-07-11 NOTE — Patient Instructions (Signed)
Nice to see you. We are getting an x-ray today to see if this provides any direction for [place] to continue your workup. If you develop worsening numbness or numbness elsewhere in your body or any weakness, slurred speech, or facial droop please seek medical attention immediately.

## 2022-07-11 NOTE — Assessment & Plan Note (Signed)
Undetermined cause.  I think this would be too focal to be related to his stroke.  He is generally neurologically intact with the exception of the palmar aspect of the described fingers in his left hand.  This could be nerve impingement from carpal tunnel or nerve impingement in his neck.  Given the discomfort on rotation of his neck we will start with a neck x-ray.  He was advised to seek medical attention if he had any worsening symptoms or any new symptoms.

## 2022-07-11 NOTE — Assessment & Plan Note (Signed)
He will continue to follow with sports medicine.

## 2022-07-13 ENCOUNTER — Ambulatory Visit: Payer: PPO | Admitting: Physical Therapy

## 2022-07-13 ENCOUNTER — Encounter: Payer: Self-pay | Admitting: Physical Therapy

## 2022-07-13 DIAGNOSIS — R262 Difficulty in walking, not elsewhere classified: Secondary | ICD-10-CM

## 2022-07-13 DIAGNOSIS — M25572 Pain in left ankle and joints of left foot: Secondary | ICD-10-CM | POA: Diagnosis not present

## 2022-07-13 NOTE — Therapy (Signed)
OUTPATIENT PHYSICAL THERAPY LOWER EXTREMITY TREATMENT   Patient Name: James Hood MRN: [id number] DOB:09/27/1940, 82 y.o., male Today's Date: 07/13/2022  END OF SESSION:  PT End of Session - 07/13/22 1116     Visit Number 2    Number of Visits 17    Date for PT Re-Evaluation 09/04/22    PT Start Time 0830    PT Stop Time 0915    PT Time Calculation (min) 45 min    Activity Tolerance Patient tolerated treatment well    Behavior During Therapy [facility] for tasks assessed/performed              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   Colon polyps    Dementia ([place])    Depression    Diabetes mellitus type 2 in nonobese (HCC)    GERD (gastroesophageal reflux disease)    BARRETTS ESOPHAGUS RESOLVED PER PATIENT   Headache    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[place])    History of hiatal hernia    Hypercholesteremia    Hypertension    Hyperthyroidism    Neuropathic pain    Primary localized osteoarthritis of right knee    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   Stomach ulcer    Tremor, essential    Wound infection complicating hardware ([place]) 04/02/2018   Past Surgical History:  Procedure Laterality Date   ANKLE ARTHROSCOPY Left 09/29/2019   Procedure: LEFT ANKLE ARTHROSCOPY, DEBRIDEMENT;  Surgeon: Newt Minion, MD;  Location: [place];  Service: Orthopedics;  Laterality: Left;   BACK SURGERY     COLONOSCOPY WITH PROPOFOL N/A 09/14/2016   Procedure: COLONOSCOPY WITH PROPOFOL;  Surgeon: Manya Silvas, MD;  Location[facility]OSCOPY;  Service: Endoscopy;  Laterality: N/A;   esophageal stretch     ESOPHAGOGASTRODUODENOSCOPY (EGD) WITH PROPOFOL N/A 12/13/2020   Procedure: ESOPHAGOGASTRODUODENOSCOPY (EGD) WITH PROPOFOL;  Surgeon: Lesly Rubenstein, MD;  Location: [facility] ENDOSCOPY;  Service: Endoscopy;   Laterality: N/A;  IDDM   JOINT REPLACEMENT Right 2016   knee   LUMBAR LAMINECTOMY/DECOMPRESSION MICRODISCECTOMY Left 02/03/2016   Procedure: LEFT L5-S1 DISKECTOMY;  Surgeon: Leeroy Cha, MD;  Location: [place] NEURO ORS;  Service: Neurosurgery;  Laterality: Left;  LEFT L5-S1 DISKECTOMY   PULSE GENERATOR IMPLANT N/A 08/21/2017   Procedure: UNILATERAL PULSE GENERATOR IMPLANT;  Surgeon: Meade Maw, MD;  Location: [facility] ORS;  Service: Neurosurgery;  Laterality: N/A;   PULSE GENERATOR IMPLANT Right 04/02/2018   Procedure: REMOVAL OF PULSE GENERATOR IMPLANT AND LEADS;  Surgeon: Meade Maw, MD;  Location: [facility] ORS;  Service: Neurosurgery;  Laterality: Right;   TONSILLECTOMY     TOTAL KNEE ARTHROPLASTY Right 11/15/2014   Procedure: TOTAL KNEE ARTHROPLASTY;  Surgeon: Elsie Saas, MD;  Location: [place];  Service: Orthopedics;  Laterality: Right;   Patient Active Problem List   Diagnosis Date Noted   Numbness and tingling in left hand 07/11/2022   Hypoxia 06/27/2022   Hypersomnia 05/24/2022   Chronic cough 11/10/2021   Pressure injury of buttock, stage 1 08/08/2021   RLS (restless legs syndrome) 06/26/2021   Bilateral leg pain 06/26/2021   Primary osteoarthritis of left shoulder 01/25/2021   Chronic, continuous use of opioids 11/15/2020   Therapeutic opioid-induced constipation (OIC) 11/15/2020   Fatigue 10/11/2020   OAB (overactive bladder) 08/08/2020   Prolonged  Q-T interval on ECG 06/08/2020   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   Osteochondral defect of ankle    Chronic pain of left ankle 02/27/2019   Vitamin D deficiency 02/18/2019   Chronic left shoulder pain 02/02/2019   Neuropathy 09/24/2018   Lumbosacral radiculopathy 01/14/2018   Primary osteoarthritis of left ankle 01/14/2018   Lumbar facet joint syndrome 01/14/2018   Chronic pain syndrome 08/26/2017   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   Thyroid nodule 02/27/2017   Fatty liver 02/27/2017   Right hip pain 08/16/2016    Lumbar herniated disc 02/03/2016   Anemia 12/09/2015   Falls 09/16/2015   Barrett's esophagus 09/16/2015   Encounter for general adult medical examination with abnormal findings 09/16/2015   Benign prostatic hyperplasia 09/16/2015   Trochanteric bursitis of left hip 09/09/2015   Difficulty in walking 06/01/2015   HLD (hyperlipidemia) 02/23/2015   DJD (degenerative joint disease) of knee 11/15/2014   Hypertension    Tremor, essential    Primary localized osteoarthritis of right knee    Anxiety and depression    Tobacco abuse 11/18/2013   Chronic diarrhea 11/12/2013   Benign neoplasm of colon 08/25/2013   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 No exacerbated pain after last session. L ankle pain ISQ. NPS: 7/10 currently.  PERTINENT HISTORY: Pt is a 82 y.o. male referred to OPPT for L ankle pain. Pt reports seeing Dr. Zigmund Daniel planning for L ankle injections trying to avoid surgical intervention. Has yet to have any steroid injections, planning for one this upcoming Wednesday, 07/18/22. Pt's pain described as achey, radiates up to his knee along the lateral portion. Worst pain described upwards to 7-8/10 NPS. Best pain 7-8/10 NPS. Currently 5/10 NPS. Typically pain more mild in the mornings as he has not walked a lot. Pain is located at lateral malleolus, slightly inferior and lateral. Pain is worsened with  standing and walking. Can tolerate about 5 minutes or less of both before onset of significant pain. 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  PAIN:  Are you having pain? Yes: NPRS scale: 5/10 Pain location: L ankle  Pain description: dull, achey Aggravating factors: standing/walker Relieving factors: N/A, maybe rest  PRECAUTIONS: None  WEIGHT BEARING RESTRICTIONS: No  FALLS:  Has patient fallen in last 6 months? Yes. Number of falls 1, tripped on steps going up  LIVING ENVIRONMENT: Lives with: lives with their family and lives with their spouse Lives in: House/apartment Stairs: Yes: External: 3 steps; on left going up Has following equipment at home: Single point cane and Walker - 2 wheeled  OCCUPATION: Retired, helps with Occupational psychologist.   PLOF: Independent  PATIENT GOALS: Wants to strengthen L ankle, improve pain. Be able to stand and walk for longer periods of time.   NEXT MD VISIT: 07/18/22   OBJECTIVE:   DIAGNOSTIC FINDINGS: CLINICAL DATA:  Ankle pain,  chronic, osteoarthritis suspected.   EXAM: MRI OF THE LEFT ANKLE WITHOUT CONTRAST   TECHNIQUE: Multiplanar, multisequence MR imaging of the ankle was performed. No intravenous contrast was administered.   COMPARISON:  Foot radiographs dated December 14, 2021   FINDINGS: TENDONS   Peroneal: Peroneal longus tendon intact. Peroneal brevis intact.   Posteromedial: Posterior tibial tendon intact. Flexor hallucis longus tendon intact. Flexor digitorum longus tendon intact.   Anterior: Tibialis anterior tendon intact. Extensor hallucis longus tendon intact Extensor digitorum longus tendon intact.   Achilles:  Intact.   Plantar Fascia: Intact.   LIGAMENTS   Lateral: Anterior talofibular ligament intact. Calcaneofibular ligament intact. Posterior talofibular ligament intact. Anterior and posterior tibiofibular ligaments  intact.   Medial: Deltoid ligament intact. Spring ligament intact.   CARTILAGE   Ankle Joint: Small joint effusion. Normal ankle mortise. Osteochondral injury about the medial aspect of the talar dome with subchondral cyst.   Subtalar Joints/Sinus Tarsi: Normal subtalar joints. No subtalar joint effusion. Normal sinus tarsi.   Bones: No marrow signal abnormality. No fracture or dislocation. 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 Tarsal bones are in normal alignment.   Soft Tissue: Soft tissue edema about the lateral aspect of the foot. No fluid collection or hematoma. Muscles are normal without edema or atrophy. Tarsal tunnel is normal.   IMPRESSION: 1. 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   2.  Ligaments and tendons are intact.   3. 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 Subtalar joints are unremarkable.   4. Mild subcutaneous soft tissue edema. No fluid collection or hematoma. No evidence of muscle strain.     Electronically Signed   By: Keane Police D.O.   On: 02/05/2022 22:43    PATIENT SURVEYS:  FOTO 42/52  COGNITION: Overall cognitive status: Within functional limits for tasks assessed     SENSATION: WFL  EDEMA:  None present   POSTURE: weight shift right in standing to offload LLE. Chronic R knee flexion due to limited TKE.  PALPATION: TTP at L ankle along inferior pole of lateral malleolus. TTP also along talar dome.  LOWER EXTREMITY ROM:  Active ROM Right eval Left eval  Hip flexion    Hip extension    Hip abduction    Hip adduction    Hip internal rotation    Hip external rotation    Knee flexion 112 124  Knee extension 11 2  Ankle dorsiflexion 15 10  Ankle plantarflexion 25 8  Ankle inversion 20 NV  Ankle eversion 22 NV   (Blank rows = not tested)  Passive ROM Right eval Left eval  Hip flexion    Hip  extension    Hip abduction    Hip adduction    Hip internal rotation    Hip external rotation    Knee flexion    Knee extension    Ankle dorsiflexion  15  Ankle plantarflexion    Ankle inversion    Ankle eversion     (Blank rows = not tested)  LOWER EXTREMITY MMT:  MMT Right eval Left eval  Hip flexion 4 4  Hip extension    Hip abduction    Hip adduction    Hip internal rotation 4 4  Hip external rotation 3+ 3+  Knee flexion 5 4  Knee extension 5 5  Ankle dorsiflexion 4 4-  Ankle plantarflexion 5 4  Ankle inversion    Ankle  eversion     (Blank rows = not tested)  LOWER EXTREMITY SPECIAL TESTS:  Not indicated  JOINT MOBILITY: Ankle AP/PA talotibial mobility hypomobile in BLE's. Concordant symptoms with firm end feel at end range AP and PA   FUNCTIONAL TESTS:  2 minute walk test: 256'. Normative data for community dwelling older adults is 496'. 10 meter walk test: .55 m/s  Stairs: Alternating step through pattern asc and desc with B hand rail use.   Squat: Excellent CKC ankle DF bilaterally. Does have narrowed stance, limited hip hinge.   GAIT: Distance walked: 10 meters Assistive device utilized: Single point cane Level of assistance: SBA Comments: Pt ambulates with very rigid gait that is antalgic on LLE. Limited hip extension at push off bilaterally and L lateral lean on LLE during stance phase indicative of glut med weakness.    TODAY'S TREATMENT:                                                                                                                              DATE: 07/13/22 Therex:  - Nustep seat 11 UE 12 L3 14mns for gentle L ankle mobility and strengthening; SPM 85-90 throughout - LLE ankle circles with foam roller x 15 moving clockwise and counterclockwise - Ankle 2-way (DF/PF) with GreenTB x 12 - LLE heel raise with bilat UE for support (one finger) 2x x 12 - Seated heel slide x 15 with good carry over - SLB 3 x 30 sec hold - Standing gastroc  stretch 1 x 30 sec hold - Standing soleus stretch 1 x 30 sec hold   PATIENT EDUCATION:  Education details: Prognosis, POC Person educated: Patient Education method: Explanation Education comprehension: verbalized understanding  HOME EXERCISE PROGRAM: - Standing Gastroc Stretch (2-3x/day; 7x/week) with 30 sec holds - Standing Soleus Stretch (2-3x/day; 7x/week) with 30 sec holds - Seated Heel Slide (2-3x/day; 7x/week) with 3 sec holds 12-20 reps - SLB (1x/day; 1x/week) with 30 sec hold  ASSESSMENT:  CLINICAL IMPRESSION: PT educated pt on updated HEP. PT instructed pt on a comprehensive HEP targeting ankle mobility, strength, endurance, static stabilization, activity modifications, and pain modulation. Pt adhered to updated HEP and demonstrated understanding. Pt was able to comply with all demonstrations, VC, and TC for proper technique of therex with excellent effort. No increase in pain for the entirety of session. NPS: 4/10 at the end of the session. Pt would benefit from skilled PT to continue decrease pain and increase gross cervical and shoulder impairments to return to PLOF and increase QoL.  OBJECTIVE IMPAIRMENTS: Abnormal gait, decreased activity tolerance, decreased balance, decreased mobility, difficulty walking, decreased ROM, decreased strength, hypomobility, impaired flexibility, improper body mechanics, postural dysfunction, and pain.   ACTIVITY LIMITATIONS: carrying, sitting, standing, squatting, stairs, and locomotion level  PARTICIPATION LIMITATIONS: shopping, community activity, and occupation  PERSONAL FACTORS: Age, Past/current experiences, Time since onset of injury/illness/exacerbation, and 3+ comorbidities: Anxiety/depression, dementia, DM type 2, HTN, R knee OA/ TKA,  CPS, B foot neuropathy  are also affecting patient's functional outcome.   REHAB POTENTIAL: Fair Chronicity of symptoms, age  CLINICAL DECISION MAKING: Evolving/moderate complexity  EVALUATION  COMPLEXITY: Moderate   GOALS: Goals reviewed with patient? No  SHORT TERM GOALS: Target date: 08/07/22 Pt will be independent with HEP to improve R knee, L ankle impairments to improve gait mechanics for improved tolerance for community distances.  Baseline:07/10/22: Deferred to next session Goal status: INITIAL  LONG TERM GOALS: Target date: 09/04/22  Pt will improve FOTO to target score or greater to demonstrate clinically significant improvement in functional mobility.  Baseline: 07/10/22: 42 with target of 52 Goal status: INITIAL  2.  Pt will improve 2 MWT to norms for community dwelling older adults with </= 5/10 pain in L ankle with gait to demonstrate clinically significant improvement to complete community distances with less pain. Baseline: 07/10/22: 256' with 7-8/10 ankle pain. Norm for 2 MWT is 496'. Goal status: INITIAL  3.  Pt will improve L ankle DF AROM by at least 5 degrees to improve L ankle mobility for improved swing phase in gait for falls risk reduction and improved L ankle pain in L ankle OA.  Baseline: 07/10/22: 10 degrees Goal status: INITIAL  4.  Pt will improve R knee AROM TKE by at least 5 degrees to assist in normalized gait mechanics to assist in offloading L ankle in weightbearing positions to decrease L ankle pain Baseline: 07/10/22: Rigid gait, Increased weight shift onto LLE in stance phase with L lateral lean due to R knee extension limitations. Lacking 11 degrees R knee from TKE. Goal status: INITIAL  PLAN:  PT FREQUENCY: 2x/week  PT DURATION: 8 weeks  PLANNED INTERVENTIONS: Therapeutic exercises, Therapeutic activity, Neuromuscular re-education, Balance training, Gait training, Patient/Family education, Joint mobilization, Aquatic Therapy, Dry Needling, Cryotherapy, Moist heat, Manual therapy, and Re-evaluation  PLAN FOR NEXT SESSION: Develop HEP   Salem Caster. Fairly IV, PT, DPT Physical Therapist- [facility]   07/13/2022, 11:18 AM

## 2022-07-16 ENCOUNTER — Ambulatory Visit (INDEPENDENT_AMBULATORY_CARE_PROVIDER_SITE_OTHER): Payer: PPO | Admitting: Psychiatry

## 2022-07-16 ENCOUNTER — Encounter: Payer: Self-pay | Admitting: Psychiatry

## 2022-07-16 ENCOUNTER — Ambulatory Visit: Payer: PPO | Admitting: Physical Therapy

## 2022-07-16 VITALS — BP 129/72 | HR 76 | Temp 97.5°F | Ht 70.0 in | Wt 165.0 lb

## 2022-07-16 DIAGNOSIS — G4700 Insomnia, unspecified: Secondary | ICD-10-CM

## 2022-07-16 DIAGNOSIS — F3341 Major depressive disorder, recurrent, in partial remission: Secondary | ICD-10-CM

## 2022-07-16 MED ORDER — TRAZODONE HCL 100 MG PO TABS: 200.0000 mg | ORAL_TABLET | Freq: Every evening | ORAL | 0 refills | Status: DC | PRN

## 2022-07-16 MED ORDER — BUPROPION HCL ER (XL) 300 MG PO TB24: 300.0000 mg | ORAL_TABLET | Freq: Every day | ORAL | 1 refills | Status: DC

## 2022-07-16 NOTE — Patient Instructions (Signed)
Continue bupropion XR 300 mg daily  Continue Trazodone 200 mg at night as needed for insomnia  Next appointment: 5/20 at 8 AM

## 2022-07-18 ENCOUNTER — Inpatient Hospital Stay (INDEPENDENT_AMBULATORY_CARE_PROVIDER_SITE_OTHER): Payer: PPO | Admitting: Radiology

## 2022-07-18 ENCOUNTER — Encounter: Payer: Self-pay | Admitting: Family Medicine

## 2022-07-18 ENCOUNTER — Ambulatory Visit (INDEPENDENT_AMBULATORY_CARE_PROVIDER_SITE_OTHER): Payer: PPO | Admitting: Family Medicine

## 2022-07-18 VITALS — BP 100/62 | HR 80 | Ht 70.0 in | Wt 166.0 lb

## 2022-07-18 DIAGNOSIS — M19072 Primary osteoarthritis, left ankle and foot: Secondary | ICD-10-CM

## 2022-07-18 DIAGNOSIS — M958 Other specified acquired deformities of musculoskeletal system: Secondary | ICD-10-CM | POA: Diagnosis not present

## 2022-07-18 DIAGNOSIS — M7671 Peroneal tendinitis, right leg: Secondary | ICD-10-CM

## 2022-07-18 MED ORDER — TRIAMCINOLONE ACETONIDE 40 MG/ML IJ SUSP
40.0000 mg | Freq: Once | INTRAMUSCULAR | Status: AC
  Administered 2022-07-18: 40 mg via INTRAMUSCULAR

## 2022-07-18 NOTE — Patient Instructions (Addendum)
You have just been given a cortisone injection to reduce pain and inflammation. After the injection you may notice immediate relief of pain as a result of the Lidocaine. It is important to rest the area of the injection for 24 to 48 hours after the injection. There is a possibility of some temporary increased discomfort and swelling for up to 72 hours until the cortisone begins to work. If you do have pain, simply rest the joint and use ice. If you can tolerate over the counter medications, you can try Tylenol for added relief per package instructions. - As above, ice x 20 minutes when home, before bed, and as-needed - Relative rest x 2 days then gradually return to normal activity - Continue with physical therapy, can discuss modifying approach to limit discomfort - Please contact us today to provide a status update on symptoms and at 2 weeks

## 2022-07-19 ENCOUNTER — Other Ambulatory Visit: Payer: Self-pay | Admitting: Family Medicine

## 2022-07-19 ENCOUNTER — Encounter: Payer: PPO | Admitting: Physical Therapy

## 2022-07-19 DIAGNOSIS — E1142 Type 2 diabetes mellitus with diabetic polyneuropathy: Secondary | ICD-10-CM

## 2022-07-20 DIAGNOSIS — M7671 Peroneal tendinitis, right leg: Secondary | ICD-10-CM | POA: Insufficient documentation

## 2022-07-20 NOTE — Assessment & Plan Note (Signed)
Patient presents for follow-up to left ankle pain in the setting of left ankle OA and OCD with additional peroneal tendinitis, has started PT, pain limiting progress. Exam shows focality to prior locations. We discussed utility of diagnostic and therapeutic injection to the left ankle, he elected to proceed with the same. Was advised on post-injection care, OTC medications, and to provide status update regarding symptom change to determine next steps.

## 2022-07-20 NOTE — Progress Notes (Signed)
     Primary Care / Sports Medicine Office Visit  Patient Information:  Patient ID: James Hood, male DOB: October 26, 1940 Age: 82 y.o. MRN: [id number]   James Hood is a pleasant 82 y.o. male presenting with the following:  Chief Complaint  Patient presents with   Procedure    Ankle pain     Vitals:   07/18/22 1016  BP: 100/62  Pulse: 80  SpO2: 94%   Vitals:   07/18/22 1016  Weight: 166 lb (75.3 kg)  Height: '5\' 10"'$  (1.778 m)   Body mass index is 23.82 kg/m.     Independent interpretation of notes and tests performed by another provider:   None  Procedures performed:   Procedure:  Injection of left anterolateral ankle under ultrasound guidance. Ultrasound guidance utilized for out of plane approach with longitudinal orientation, subtle effusion noted. Samsung HS60 device utilized with permanent recording / reporting. Verbal informed consent obtained and verified. Skin prepped in a sterile fashion. Ethyl chloride for topical local analgesia.  Completed without difficulty and tolerated well. Medication: triamcinolone acetonide 40 mg/mL suspension for injection 1 mL total and 2 mL lidocaine 1% without epinephrine utilized for needle placement anesthetic Advised to contact for fevers/chills, erythema, induration, drainage, or persistent bleeding.   Pertinent History, Exam, Impression, and Recommendations:   James Hood was seen today for procedure.  Primary osteoarthritis of left ankle Assessment & Plan: Patient presents for follow-up to left ankle pain in the setting of left ankle OA and OCD with additional peroneal tendinitis, has started PT, pain limiting progress. Exam shows focality to prior locations. We discussed utility of diagnostic and therapeutic injection to the left ankle, he elected to proceed with the same. Was advised on post-injection care, OTC medications, and to provide status update regarding symptom change to determine next steps.  Orders: -      [place] LIMITED JOINT SPACE STRUCTURES LOW LEFT; Future -     Triamcinolone Acetonide  Osteochondral defect of ankle Assessment & Plan: See additional assessment(s) for plan details.  Orders: -     [place] LIMITED JOINT SPACE STRUCTURES LOW LEFT; Future -     Triamcinolone Acetonide  Peroneal tendinitis, right Assessment & Plan: See additional assessment(s) for plan details.      Orders & Medications Meds ordered this encounter  Medications   triamcinolone acetonide (KENALOG-40) injection 40 mg   Orders Placed This Encounter  Procedures   [place] LIMITED JOINT SPACE STRUCTURES LOW LEFT     Return if symptoms worsen or fail to improve.     James Culver, MD, [facility]   Primary Care Sports Medicine Primary Care and Sports Medicine at [facility]

## 2022-07-20 NOTE — Assessment & Plan Note (Signed)
See additional assessment(s) for plan details. °

## 2022-07-20 NOTE — Assessment & Plan Note (Signed)
Chronic condition with pain localized to the anterolateral ankle, focality to the peroneal's.  Lengthy discussion regarding treatment strategies, is amenable to physical therapy, corticosteroid injection.

## 2022-07-20 NOTE — Progress Notes (Signed)
     Primary Care / Sports Medicine Office Visit  Patient Information:  Patient ID: James Hood, male DOB: 1940/07/15 Age: 82 y.o. MRN: [id number]   James Hood is a pleasant 82 y.o. male presenting with the following:  Chief Complaint  Patient presents with   Chronic pain of left ankle    Feeling worst, is here to talk about PRP and other options     Vitals:   07/05/22 0916  BP: 112/76  Pulse: 78  SpO2: 97%   Vitals:   07/05/22 0916  Weight: 163 lb (73.9 kg)  Height: '5\' 10"'$  (1.778 m)   Body mass index is 23.39 kg/m.     Independent interpretation of notes and tests performed by another provider:   None  Procedures performed:   None  Pertinent History, Exam, Impression, and Recommendations:   James Hood was seen today for chronic pain of left ankle.  Osteochondral defect of ankle Assessment & Plan: Chronic condition with pain localized to the anterolateral ankle, focality to the peroneal's.  Lengthy discussion regarding treatment strategies, is amenable to physical therapy, corticosteroid injection.  Orders: -     Ambulatory referral to Physical Therapy  Primary osteoarthritis of left ankle -     Ambulatory referral to Physical Therapy  Peroneal tendinitis, right -     Ambulatory referral to Physical Therapy   I provided a total time of 34 minutes including both face-to-face and non-face-to-face time on 07/20/2022 inclusive of time utilized for medical chart review, information gathering, care coordination with staff, and documentation completion.   Orders & Medications No orders of the defined types were placed in this encounter.  Orders Placed This Encounter  Procedures   Ambulatory referral to Physical Therapy     No follow-ups on file.     Montel Culver, MD, [facility]   Primary Care Sports Medicine Primary Care and Sports Medicine at [facility]

## 2022-07-23 ENCOUNTER — Ambulatory Visit: Payer: PPO | Admitting: Physical Therapy

## 2022-07-23 ENCOUNTER — Other Ambulatory Visit: Payer: Self-pay | Admitting: Family Medicine

## 2022-07-23 DIAGNOSIS — N400 Enlarged prostate without lower urinary tract symptoms: Secondary | ICD-10-CM

## 2022-07-25 ENCOUNTER — Ambulatory Visit (INDEPENDENT_AMBULATORY_CARE_PROVIDER_SITE_OTHER): Payer: PPO | Admitting: Licensed Clinical Social Worker

## 2022-07-25 DIAGNOSIS — F3341 Major depressive disorder, recurrent, in partial remission: Secondary | ICD-10-CM | POA: Diagnosis not present

## 2022-07-25 NOTE — Progress Notes (Signed)
Virtual Visit via Video Note  I connected with James Hood on 07/25/22 at  8:00 AM EST by a video enabled telemedicine application and verified that I am speaking with the correct person using two identifiers.  Location: Patient: home Provider:[facility]   I discussed the limitations of evaluation and management by telemedicine and the availability of in person appointments. The patient expressed understanding and agreed to proceed.   I discussed the assessment and treatment plan with the patient. The patient was provided an opportunity to ask questions and all were answered. The patient agreed with the plan and demonstrated an understanding of the instructions.   The patient was advised to call back or seek an in-person evaluation if the symptoms worsen or if the condition fails to improve as anticipated.  I provided 45 minutes of non-face-to-face time during this encounter.   [place], LCSW   THERAPIST PROGRESS NOTE  Session Time: [phone]  Participation Level: Active  Behavioral Response: Neat and Well GroomedAlertAnxious  Type of Therapy: Individual Therapy  Treatment Goals addressed:LTG: Reduce frequency, intensity, and duration of depression symptoms as evidenced by: pt self report  ProgressTowards Goals: Progressing  Interventions: CBT  Summary: James Hood is a 82 y.o. male who presents with improving symptoms related to his depression diagnosis. Pt reports overall mood is stable and that he is managing situational stressors better.  Patient reports that he has recently received some medical treatment on his ankle, and he is not in chronic pain on a daily basis anymore. Patient reports that this  has created a positive emotional impact on his overall well-being. Patient reports that he is receiving cortisone injections on a regular basis.  Explore patients thoughts and feelings associated with recent  election--patient states that he is very happy with the overall results.  Discussed ongoing meetings with AA and patients four sponsees. Patient reports that it keeps him very busy working through the steps with four different people on an individual basis. Patient feels that this is good social engagement for him, and he also attends multiple AA meetings throughout the week. Patient feels that this work gives him "life's purpose".   Patient reports that relationship with wife is going good, and extended relationships with other family members are going good. Patient reports he has some concerns about his grandson being disrespectful, but feels that grandson being aged 24 has a lot to do with it.  Continued recommendations are as follows: self care behaviors, positive social engagements, focusing on overall work/home/life balance, and focusing on positive physical and emotional wellness.  Suicidal/Homicidal: No  Therapist Response: Pt is continuing to apply interventions learned in session into daily life situations. Pt is currently on track to meet goals utilizing interventions mentioned above. Personal growth and progress noted. Treatment to continue as indicated.   Plan: Return again in 4 weeks.  Diagnosis:  Encounter Diagnosis  Name Primary?   MDD (major depressive disorder), recurrent, in partial remission ([place]) Yes    Collaboration of Care: Other Pt encouraged to continue psychiatric care with Dr. Norman Clay  Patient/Guardian was advised Release of Information must be obtained prior to any record release in order to collaborate their care with an outside provider. Patient/Guardian was advised if they have not already done so to contact the registration department to sign all necessary forms in order for [place] to release information regarding their care.   Consent: Patient/Guardian gives verbal consent for treatment and assignment of benefits for services  provided during this visit.  Patient/Guardian expressed understanding and agreed to proceed.   [place], LCSW 07/25/2022

## 2022-07-27 ENCOUNTER — Ambulatory Visit: Payer: PPO | Attending: Family Medicine | Admitting: Physical Therapy

## 2022-07-27 DIAGNOSIS — G8929 Other chronic pain: Secondary | ICD-10-CM | POA: Insufficient documentation

## 2022-07-27 DIAGNOSIS — R262 Difficulty in walking, not elsewhere classified: Secondary | ICD-10-CM | POA: Insufficient documentation

## 2022-07-27 DIAGNOSIS — M6281 Muscle weakness (generalized): Secondary | ICD-10-CM | POA: Diagnosis not present

## 2022-07-27 DIAGNOSIS — M545 Low back pain, unspecified: Secondary | ICD-10-CM | POA: Diagnosis not present

## 2022-07-27 DIAGNOSIS — M25572 Pain in left ankle and joints of left foot: Secondary | ICD-10-CM | POA: Insufficient documentation

## 2022-07-27 NOTE — Therapy (Signed)
OUTPATIENT PHYSICAL THERAPY LOWER EXTREMITY TREATMENT   Patient Name: James Hood MRN: [id number] DOB:1940-08-24, 82 y.o., male Today's Date: 07/27/2022  END OF SESSION:  PT End of Session - 07/27/22 0748     Visit Number 3    Number of Visits 17    Date for PT Re-Evaluation 09/04/22    Authorization - Visit Number 3    Authorization - Number of Visits 10    Progress Note Due on Visit 10    PT Start Time 0745    PT Stop Time 0830    PT Time Calculation (min) 45 min    Activity Tolerance Patient tolerated treatment well    Behavior During Therapy [facility] for tasks assessed/performed               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   Colon polyps    Dementia ([place])    Depression    Diabetes mellitus type 2 in nonobese (HCC)    GERD (gastroesophageal reflux disease)    BARRETTS ESOPHAGUS RESOLVED PER PATIENT   Headache    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[place])    History of hiatal hernia    Hypercholesteremia    Hypertension    Hyperthyroidism    Neuropathic pain    Primary localized osteoarthritis of right knee    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   Stomach ulcer    Tremor, essential    Wound infection complicating hardware ([place]) 04/02/2018   Past Surgical History:  Procedure Laterality Date   ANKLE ARTHROSCOPY Left 09/29/2019   Procedure: LEFT ANKLE ARTHROSCOPY, DEBRIDEMENT;  Surgeon: Newt Minion, MD;  Location: [place];  Service: Orthopedics;  Laterality: Left;   BACK SURGERY     COLONOSCOPY WITH PROPOFOL N/A 09/14/2016   Procedure: COLONOSCOPY WITH PROPOFOL;  Surgeon: Manya Silvas, MD;  Location: [facility] ENDOSCOPY;  Service: Endoscopy;  Laterality: N/A;   esophageal stretch     ESOPHAGOGASTRODUODENOSCOPY (EGD) WITH PROPOFOL N/A 12/13/2020   Procedure:  ESOPHAGOGASTRODUODENOSCOPY (EGD) WITH PROPOFOL;  Surgeon: Lesly Rubenstein, MD;  Location: [facility] ENDOSCOPY;  Service: Endoscopy;  Laterality: N/A;  IDDM   JOINT REPLACEMENT Right 2016   knee   LUMBAR LAMINECTOMY/DECOMPRESSION MICRODISCECTOMY Left 02/03/2016   Procedure: LEFT L5-S1 DISKECTOMY;  Surgeon: Leeroy Cha, MD;  Location: [place] [facility]URO ORS;  Service: Neurosurgery;  Laterality: Left;  LEFT L5-S1 DISKECTOMY   PULSE GENERATOR IMPLANT N/A 08/21/2017   Procedure: UNILATERAL PULSE GENERATOR IMPLANT;  Surgeon: Meade Maw, MD;  Location: [facility] ORS;  Service: Neurosurgery;  Laterality: N/A;   PULSE GENERATOR IMPLANT Right 04/02/2018   Procedure: REMOVAL OF PULSE GENERATOR IMPLANT AND LEADS;  Surgeon: Meade Maw, MD;  Location: [facility] ORS;  Service: Neurosurgery;  Laterality: Right;   TONSILLECTOMY     TOTAL KNEE ARTHROPLASTY Right 11/15/2014   Procedure: TOTAL KNEE ARTHROPLASTY;  Surgeon: Elsie Saas, MD;  Location: [place];  Service: Orthopedics;  Laterality: Right;   Patient Active Problem List   Diagnosis Date Noted   Peroneal tendinitis, right 07/20/2022   Numbness and tingling in left hand 07/11/2022   Hypoxia 06/27/2022   Hypersomnia 05/24/2022   Chronic cough 11/10/2021   Pressure injury of buttock, stage 1 08/08/2021   RLS (restless legs syndrome) 06/26/2021   Bilateral leg pain 06/26/2021   Primary  osteoarthritis of left shoulder 01/25/2021   Chronic, continuous use of opioids 11/15/2020   Therapeutic opioid-induced constipation (OIC) 11/15/2020   Fatigue 10/11/2020   OAB (overactive bladder) 08/08/2020   Prolonged Q-T interval on ECG 06/08/2020   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   Osteochondral defect of ankle    Chronic pain of left ankle 02/27/2019   Vitamin D deficiency 02/18/2019   Chronic left shoulder pain 02/02/2019   Neuropathy 09/24/2018   Lumbosacral radiculopathy 01/14/2018   Primary osteoarthritis of left ankle 01/14/2018   Lumbar facet joint syndrome  01/14/2018   Chronic pain syndrome 08/26/2017   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   Thyroid nodule 02/27/2017   Fatty liver 02/27/2017   Right hip pain 08/16/2016   Lumbar herniated disc 02/03/2016   Anemia 12/09/2015   Falls 09/16/2015   Barrett's esophagus 09/16/2015   Encounter for general adult medical examination with abnormal findings 09/16/2015   Benign prostatic hyperplasia 09/16/2015   Trochanteric bursitis of left hip 09/09/2015   Difficulty in walking 06/01/2015   HLD (hyperlipidemia) 02/23/2015   DJD (degenerative joint disease) of knee 11/15/2014   Hypertension    Tremor, essential    Primary localized osteoarthritis of right knee    Anxiety and depression    Tobacco abuse 11/18/2013   Chronic diarrhea 11/12/2013   Benign neoplasm of colon 08/25/2013   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   Type 2 diabetes mellitus ([place]) 10/06/2012    PCP: Tommi Rumps, MD  REFERRING PROVIDER: Rosette Reveal  REFERRING DIAG: M95.8 (ICD-10-CM) - Osteochondral defect of ankle M19.072 (ICD-10-CM) - Primary osteoarthritis of left ankle M76.71 (ICD-10-CM) - Peroneal tendinitis, right  THERAPY DIAG:  Pain in left ankle and joints of left foot  Difficulty in walking, not elsewhere classified  Rationale for Evaluation and Treatment: Rehabilitation  ONSET DATE: Has had years of L ankle pain  SUBJECTIVE:   SUBJECTIVE STATEMENT: Pt reports his cortisone shot is wearing off- that he has 5/10 pain this am. He has not completed his HEP d/t feeling really good and feeling like he did not need them initially.   PERTINENT HISTORY: Pt is a 82 y.o. male referred to OPPT for L ankle pain. Pt reports seeing Dr. Zigmund Daniel planning for L ankle injections trying to avoid surgical intervention. Has yet to have any steroid injections, planning for one this upcoming Wednesday, 07/18/22. Pt's pain described as achey, radiates up to his knee along the lateral portion. Worst pain described  upwards to 7-8/10 NPS. Best pain 7-8/10 NPS. Currently 5/10 NPS. Typically pain more mild in the mornings as he has not walked a lot. Pain is located at lateral malleolus, slightly inferior and lateral. Pain is worsened with standing and walking. Can tolerate about 5 minutes or less of both before onset of significant pain. 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  PAIN:  Are you having pain? Yes: NPRS scale: 5/10 Pain location: L ankle  Pain description: dull, achey Aggravating factors: standing/walker Relieving factors: N/A, maybe rest  PRECAUTIONS: None  WEIGHT BEARING RESTRICTIONS: No  FALLS:  Has patient fallen in last 6 months? Yes. Number of falls 1, tripped on steps going up  LIVING ENVIRONMENT: Lives with: lives with their family and lives with their spouse Lives in: House/apartment Stairs: Yes: External: 3 steps; on left going up Has following equipment at home: Single point cane and Walker - 2 wheeled  OCCUPATION: Retired, helps with  county Probation officer.   PLOF: Independent  PATIENT GOALS: Wants to strengthen L ankle, improve pain. Be able to stand and walk for longer periods of time.   NEXT MD VISIT: 07/18/22   OBJECTIVE:   DIAGNOSTIC FINDINGS: CLINICAL DATA:  Ankle pain, chronic, osteoarthritis suspected.   EXAM: MRI OF THE LEFT ANKLE WITHOUT CONTRAST   TECHNIQUE: Multiplanar, multisequence MR imaging of the ankle was performed. No intravenous contrast was administered.   COMPARISON:  Foot radiographs dated December 14, 2021   FINDINGS: TENDONS   Peroneal: Peroneal longus tendon intact. Peroneal brevis intact.   Posteromedial: Posterior tibial tendon intact. Flexor hallucis longus tendon intact. Flexor digitorum longus tendon intact.   Anterior: Tibialis anterior tendon intact. Extensor hallucis longus tendon intact Extensor digitorum longus tendon intact.    Achilles:  Intact.   Plantar Fascia: Intact.   LIGAMENTS   Lateral: Anterior talofibular ligament intact. Calcaneofibular ligament intact. Posterior talofibular ligament intact. Anterior and posterior tibiofibular ligaments intact.   Medial: Deltoid ligament intact. Spring ligament intact.   CARTILAGE   Ankle Joint: Small joint effusion. Normal ankle mortise. Osteochondral injury about the medial aspect of the talar dome with subchondral cyst.   Subtalar Joints/Sinus Tarsi: Normal subtalar joints. No subtalar joint effusion. Normal sinus tarsi.   Bones: No marrow signal abnormality. No fracture or dislocation. 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 Tarsal bones are in normal alignment.   Soft Tissue: Soft tissue edema about the lateral aspect of the foot. No fluid collection or hematoma. Muscles are normal without edema or atrophy. Tarsal tunnel is normal.   IMPRESSION: 1. 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   2.  Ligaments and tendons are intact.   3. 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 Subtalar joints are unremarkable.   4. Mild subcutaneous soft tissue edema. No fluid collection or hematoma. No evidence of muscle strain.     Electronically Signed   By: Keane Police D.O.   On: 02/05/2022 22:43    PATIENT SURVEYS:  FOTO 42/52  COGNITION: Overall cognitive status: Within functional limits for tasks assessed     SENSATION: WFL  EDEMA:  None present   POSTURE: weight shift right in standing to offload LLE. Chronic R knee flexion due to limited TKE.  PALPATION: TTP at L ankle along inferior pole of lateral malleolus. TTP also along talar dome.  LOWER EXTREMITY ROM:  Active ROM Right eval Left eval  Hip flexion    Hip extension    Hip abduction    Hip adduction    Hip internal rotation    Hip external rotation    Knee  flexion 112 124  Knee extension 11 2  Ankle dorsiflexion 15 10  Ankle plantarflexion 25 8  Ankle inversion 20 NV  Ankle eversion 22 NV   (Blank rows = not tested)  Passive ROM Right eval Left eval  Hip flexion    Hip extension    Hip abduction    Hip adduction    Hip internal rotation    Hip external rotation    Knee flexion    Knee extension    Ankle dorsiflexion  15  Ankle plantarflexion    Ankle inversion    Ankle eversion     (Blank rows = not tested)  LOWER EXTREMITY MMT:  MMT Right eval Left eval  Hip flexion 4 4  Hip extension    Hip abduction  Hip adduction    Hip internal rotation 4 4  Hip external rotation 3+ 3+  Knee flexion 5 4  Knee extension 5 5  Ankle dorsiflexion 4 4-  Ankle plantarflexion 5 4  Ankle inversion    Ankle eversion     (Blank rows = not tested)  LOWER EXTREMITY SPECIAL TESTS:  Not indicated  JOINT MOBILITY: Ankle AP/PA talotibial mobility hypomobile in BLE's. Concordant symptoms with firm end feel at end range AP and PA   FUNCTIONAL TESTS:  2 minute walk test: 256'. Normative data for community dwelling older adults is 496'. 10 meter walk test: .55 m/s  Stairs: Alternating step through pattern asc and desc with B hand rail use.   Squat: Excellent CKC ankle DF bilaterally. Does have narrowed stance, limited hip hinge.   GAIT: Distance walked: 10 meters Assistive device utilized: Single point cane Level of assistance: SBA Comments: Pt ambulates with very rigid gait that is antalgic on LLE. Limited hip extension at push off bilaterally and L lateral lean on LLE during stance phase indicative of glut med weakness.    TODAY'S TREATMENT:                                                                                                                              DATE: 07/13/22 Therex:  - Nustep seat 11 UE 12 L3 16mns for gentle L ankle mobility and strengthening; SPM 85-90 throughout - Heel slides (sock on step) x20 3sec hold  each direction  - SLS on LLE unable; L tandem 7sec; L semi tandem 30sec hold without HHA - L semi tandem ball toss/catch 2x 12 with PT throwing ball slightly outside BOS with good carry over - Mini squat on foam pad 2x 12 with occassional HHA at bar - Standing gastroc stretch 1 x 30 sec hold - Standing ant tib stretch  1 x 30 sec hold   PATIENT EDUCATION:  Education details: Prognosis, POC Person educated: Patient Education method: Explanation Education comprehension: verbalized understanding  HOME EXERCISE PROGRAM: - Standing Gastroc Stretch (2-3x/day; 7x/week) with 30 sec holds - Standing Soleus Stretch (2-3x/day; 7x/week) with 30 sec holds - Seated Heel Slide (2-3x/day; 7x/week) with 3 sec holds 12-20 reps - SLB (1x/day; 1x/week) with 30 sec hold  ASSESSMENT:  CLINICAL IMPRESSION: PT continued therex progression for increased ankle mobility and lower leg stability and strength.  Pt is able to comply with all cuing for proper technique of therex with excellent motivation throughout session. Patient with difficulty with L SLS and full tandem position. Pt would benefit from skilled PT to continue decrease pain and increase gross cervical and shoulder impairments to return to PLOF and increase QoL.  OBJECTIVE IMPAIRMENTS: Abnormal gait, decreased activity tolerance, decreased balance, decreased mobility, difficulty walking, decreased ROM, decreased strength, hypomobility, impaired flexibility, improper body mechanics, postural dysfunction, and pain.   ACTIVITY LIMITATIONS: carrying, sitting, standing, squatting, stairs, and locomotion level  PARTICIPATION LIMITATIONS: shopping, community activity,  and occupation  PERSONAL FACTORS: Age, Past/current experiences, Time since onset of injury/illness/exacerbation, and 3+ comorbidities: Anxiety/depression, dementia, DM type 2, HTN, R knee OA/ TKA, CPS, B foot neuropathy  are also affecting patient's functional outcome.   REHAB POTENTIAL:  Fair Chronicity of symptoms, age  CLINICAL DECISION MAKING: Evolving/moderate complexity  EVALUATION COMPLEXITY: Moderate   GOALS: Goals reviewed with patient? No  SHORT TERM GOALS: Target date: 08/07/22 Pt will be independent with HEP to improve R knee, L ankle impairments to improve gait mechanics for improved tolerance for community distances.  Baseline:07/10/22: Deferred to next session Goal status: INITIAL  LONG TERM GOALS: Target date: 09/04/22  Pt will improve FOTO to target score or greater to demonstrate clinically significant improvement in functional mobility.  Baseline: 07/10/22: 42 with target of 52 Goal status: INITIAL  2.  Pt will improve 2 MWT to norms for community dwelling older adults with </= 5/10 pain in L ankle with gait to demonstrate clinically significant improvement to complete community distances with less pain. Baseline: 07/10/22: 256' with 7-8/10 ankle pain. Norm for 2 MWT is 496'. Goal status: INITIAL  3.  Pt will improve L ankle DF AROM by at least 5 degrees to improve L ankle mobility for improved swing phase in gait for falls risk reduction and improved L ankle pain in L ankle OA.  Baseline: 07/10/22: 10 degrees Goal status: INITIAL  4.  Pt will improve R knee AROM TKE by at least 5 degrees to assist in normalized gait mechanics to assist in offloading L ankle in weightbearing positions to decrease L ankle pain Baseline: 07/10/22: Rigid gait, Increased weight shift onto LLE in stance phase with L lateral lean due to R knee extension limitations. Lacking 11 degrees R knee from TKE. Goal status: INITIAL  PLAN:  PT FREQUENCY: 2x/week  PT DURATION: 8 weeks  PLANNED INTERVENTIONS: Therapeutic exercises, Therapeutic activity, Neuromuscular re-education, Balance training, Gait training, Patient/Family education, Joint mobilization, Aquatic Therapy, Dry Needling, Cryotherapy, Moist heat, Manual therapy, and Re-evaluation  PLAN FOR NEXT SESSION: continue  [place] DPT Physical Therapist- [facility]  07/27/2022, 9:01 AM

## 2022-07-30 ENCOUNTER — Other Ambulatory Visit: Payer: Self-pay | Admitting: Family Medicine

## 2022-07-30 ENCOUNTER — Ambulatory Visit: Payer: PPO

## 2022-07-30 DIAGNOSIS — E785 Hyperlipidemia, unspecified: Secondary | ICD-10-CM

## 2022-07-30 DIAGNOSIS — M25572 Pain in left ankle and joints of left foot: Secondary | ICD-10-CM | POA: Diagnosis not present

## 2022-07-30 DIAGNOSIS — G2581 Restless legs syndrome: Secondary | ICD-10-CM

## 2022-07-30 DIAGNOSIS — G8929 Other chronic pain: Secondary | ICD-10-CM

## 2022-07-30 DIAGNOSIS — M6281 Muscle weakness (generalized): Secondary | ICD-10-CM

## 2022-07-30 DIAGNOSIS — M545 Low back pain, unspecified: Secondary | ICD-10-CM

## 2022-07-30 DIAGNOSIS — R262 Difficulty in walking, not elsewhere classified: Secondary | ICD-10-CM

## 2022-07-30 NOTE — Therapy (Signed)
OUTPATIENT PHYSICAL THERAPY TREATMENT   Patient Name: James Hood MRN: [id number] DOB:1940-09-19, 82 y.o., male Today's Date: 07/30/2022  END OF SESSION:  PT End of Session - 07/30/22 0836     Visit Number 4    Number of Visits 17    Date for PT Re-Evaluation 09/04/22    Authorization Type [employer]    Authorization Time Period 07/10/22-09/04/22    Progress Note Due on Visit 10    PT Start Time 0832    PT Stop Time 0912    PT Time Calculation (min) 40 min    Equipment Utilized During Treatment Gait belt    Activity Tolerance Patient tolerated treatment well;No increased pain    Behavior During Therapy WFL for tasks assessed/performed               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   Colon polyps    Dementia ([place])    Depression    Diabetes mellitus type 2 in nonobese (HCC)    GERD (gastroesophageal reflux disease)    BARRETTS ESOPHAGUS RESOLVED PER PATIENT   Headache    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[place])    History of hiatal hernia    Hypercholesteremia    Hypertension    Hyperthyroidism    Neuropathic pain    Primary localized osteoarthritis of right knee    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   Stomach ulcer    Tremor, essential    Wound infection complicating hardware ([place]) 04/02/2018   Past Surgical History:  Procedure Laterality Date   ANKLE ARTHROSCOPY Left 09/29/2019   Procedure: LEFT ANKLE ARTHROSCOPY, DEBRIDEMENT;  Surgeon: Newt Minion, MD;  Location: [place];  Service: Orthopedics;  Laterality: Left;   BACK SURGERY     COLONOSCOPY WITH PROPOFOL N/A 09/14/2016   Procedure: COLONOSCOPY WITH PROPOFOL;  Surgeon: Manya Silvas, MD;  Location: [facility] ENDOSCOPY;  Service: Endoscopy;  Laterality: N/A;   esophageal stretch      ESOPHAGOGASTRODUODENOSCOPY (EGD) WITH PROPOFOL N/A 12/13/2020   Procedure: ESOPHAGOGASTRODUODENOSCOPY (EGD) WITH PROPOFOL;  Surgeon: Lesly Rubenstein, MD;  Location: [facility] ENDOSCOPY;  Service: Endoscopy;  Laterality: N/A;  IDDM   JOINT REPLACEMENT Right 2016   knee   LUMBAR LAMINECTOMY/DECOMPRESSION MICRODISCECTOMY Left 02/03/2016   Procedure: LEFT L5-S1 DISKECTOMY;  Surgeon: Leeroy Cha, MD;  Location: [place] NEURO ORS;  Service: Neurosurgery;  Laterality: Left;  LEFT L5-S1 DISKECTOMY   PULSE GENERATOR IMPLANT N/A 08/21/2017   Procedure: UNILATERAL PULSE GENERATOR IMPLANT;  Surgeon: Meade Maw, MD;  Location: [facility] ORS;  Service: Neurosurgery;  Laterality: N/A;   PULSE GENERATOR IMPLANT Right 04/02/2018   Procedure: REMOVAL OF PULSE GENERATOR IMPLANT AND LEADS;  Surgeon: Meade Maw, MD;  Location: [facility] ORS;  Service: Neurosurgery;  Laterality: Right;   TONSILLECTOMY     TOTAL KNEE ARTHROPLASTY Right 11/15/2014   Procedure: TOTAL KNEE ARTHROPLASTY;  Surgeon: Elsie Saas, MD;  Location: [place];  Service: Orthopedics;  Laterality: Right;   Patient Active Problem List   Diagnosis Date Noted   Peroneal tendinitis, right 07/20/2022   Numbness and tingling in left hand 07/11/2022   Hypoxia 06/27/2022   Hypersomnia 05/24/2022   Chronic cough 11/10/2021   Pressure injury of buttock, stage 1 08/08/2021   RLS (restless legs syndrome) 06/26/2021  Bilateral leg pain 06/26/2021   Primary osteoarthritis of left shoulder 01/25/2021   Chronic, continuous use of opioids 11/15/2020   Therapeutic opioid-induced constipation (OIC) 11/15/2020   Fatigue 10/11/2020   OAB (overactive bladder) 08/08/2020   Prolonged Q-T interval on ECG 06/08/2020   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   Osteochondral defect of ankle    Chronic pain of left ankle 02/27/2019   Vitamin D deficiency 02/18/2019   Chronic left shoulder pain 02/02/2019   Neuropathy 09/24/2018   Lumbosacral radiculopathy 01/14/2018   Primary  osteoarthritis of left ankle 01/14/2018   Lumbar facet joint syndrome 01/14/2018   Chronic pain syndrome 08/26/2017   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   Thyroid nodule 02/27/2017   Fatty liver 02/27/2017   Right hip pain 08/16/2016   Lumbar herniated disc 02/03/2016   Anemia 12/09/2015   Falls 09/16/2015   Barrett's esophagus 09/16/2015   Encounter for general adult medical examination with abnormal findings 09/16/2015   Benign prostatic hyperplasia 09/16/2015   Trochanteric bursitis of left hip 09/09/2015   Difficulty in walking 06/01/2015   HLD (hyperlipidemia) 02/23/2015   DJD (degenerative joint disease) of knee 11/15/2014   Hypertension    Tremor, essential    Primary localized osteoarthritis of right knee    Anxiety and depression    Tobacco abuse 11/18/2013   Chronic diarrhea 11/12/2013   Benign neoplasm of colon 08/25/2013   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   Type 2 diabetes mellitus ([place]) 10/06/2012    PCP: Tommi Rumps, MD  REFERRING PROVIDER: Rosette Reveal  REFERRING DIAG: M95.8 (ICD-10-CM) - Osteochondral defect of ankle M19.072 (ICD-10-CM) - Primary osteoarthritis of left ankle M76.71 (ICD-10-CM) - Peroneal tendinitis, right  THERAPY DIAG:  Pain in left ankle and joints of left foot  Difficulty in walking, not elsewhere classified  Muscle weakness (generalized)  Chronic bilateral low back pain without sciatica  Rationale for Evaluation and Treatment: Rehabilitation  ONSET DATE: Has had years of L ankle pain  SUBJECTIVE:   SUBJECTIVE STATEMENT: HEP still going well. Ankle feels more painful today, maybe due to cold front? He is not hopeful about his ankle feeling better.   PERTINENT HISTORY: Pt is a 82 y.o. male referred to OPPT for L ankle pain. Pt reports seeing Dr. Zigmund Daniel planning for L ankle injections trying to avoid surgical intervention. Has yet to have any steroid injections, planning for one this upcoming Wednesday, 07/18/22.  Pt's pain described as achey, radiates up to his knee along the lateral portion. Worst pain described upwards to 7-8/10 NPS. Best pain 7-8/10 NPS. Currently 5/10 NPS. Typically pain more mild in the mornings as he has not walked a lot. Pain is located at lateral malleolus, slightly inferior and lateral. Pain is worsened with standing and walking. Can tolerate about 5 minutes or less of both before onset of significant pain. 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  PAIN:  Are you having pain? Yes: NPRS scale: 5/10 Pain location: L ankle  Pain description: dull, achey Aggravating factors: standing/walker Relieving factors: N/A, maybe rest  PRECAUTIONS: None  WEIGHT BEARING RESTRICTIONS: No  FALLS:  Has patient fallen in last 6 months? Yes. Number of falls 1, tripped on steps going up    PATIENT GOALS: Wants to strengthen L ankle, improve pain. Be able to stand and walk for longer periods of time.   NEXT MD VISIT: 07/18/22   OBJECTIVE:    TODAY'S  TREATMENT:                                                                                                                              DATE: 07/30/22  Therex:  - Nustep seat 11 UE 12 L3 65mns for gentle L ankle mobility and strengthening; SPM 85-90 throughout - Heel slides (sock on step) x20 3sec hold each direction (more limited in plantarflexion ROM) - seated Left ankle DF 1x12 @ 3lb  - seated Left ankle PF 1x12 @ 20lb over knee (heel hang off step)  - Left ankle distraction 3x30sec - Soft tissue friction massage Left extensor mechanism from retinaculum distal to dorsum of foot x10 minutes      PATIENT EDUCATION:  Education details: Prognosis, POC Person educated: Patient Education method: Explanation Education comprehension: verbalized understanding  HOME EXERCISE PROGRAM: - SPsychologist, counselling(2-3x/day; 7x/week) with 30 sec holds -  Standing Soleus Stretch (2-3x/day; 7x/week) with 30 sec holds - Seated Heel Slide (2-3x/day; 7x/week) with 3 sec holds 12-20 reps - SLB (1x/day; 1x/week) with 30 sec hold  ASSESSMENT:  CLINICAL IMPRESSION: PT continued therex progression for increased ankle ROM and lower leg stabilization and motor control. Pt is able to comply with all cuing for proper technique of therex with excellent motivation throughout session. Stretch, discomfort pain, all more related to Left anterior compartment and limited tissue length at dorsal extensors causing more difficult. Pt would benefit from skilled PT to continue decrease pain and increase gross cervical and shoulder impairments to return to PLOF and increase QoL.  OBJECTIVE IMPAIRMENTS: Abnormal gait, decreased activity tolerance, decreased balance, decreased mobility, difficulty walking, decreased ROM, decreased strength, hypomobility, impaired flexibility, improper body mechanics, postural dysfunction, and pain.   ACTIVITY LIMITATIONS: carrying, sitting, standing, squatting, stairs, and locomotion level  PARTICIPATION LIMITATIONS: shopping, community activity, and occupation  PERSONAL FACTORS: Age, Past/current experiences, Time since onset of injury/illness/exacerbation, and 3+ comorbidities: Anxiety/depression, dementia, DM type 2, HTN, R knee OA/ TKA, CPS, B foot neuropathy  are also affecting patient's functional outcome.   REHAB POTENTIAL: Fair Chronicity of symptoms, age  CLINICAL DECISION MAKING: Evolving/moderate complexity  EVALUATION COMPLEXITY: Moderate   GOALS: Goals reviewed with patient? No  SHORT TERM GOALS: Target date: 08/07/22 Pt will be independent with HEP to improve R knee, L ankle impairments to improve gait mechanics for improved tolerance for community distances.  Baseline:07/10/22: Deferred to next session Goal status: INITIAL  LONG TERM GOALS: Target date: 09/04/22  Pt will improve FOTO to target score or greater to  demonstrate clinically significant improvement in functional mobility.  Baseline: 07/10/22: 42 with target of 52 Goal status: INITIAL  2.  Pt will improve 2 MWT to norms for community dwelling older adults with </= 5/10 pain in L ankle with gait to demonstrate clinically significant improvement to complete community distances with less pain. Baseline: 07/10/22: 256' with 7-8/10 ankle pain. Norm for 2 MWT is 496'. Goal status: INITIAL  3.  Pt will improve L ankle DF AROM by at least 5 degrees to improve L ankle mobility for improved swing phase in gait for falls risk reduction and improved L ankle pain in L ankle OA.  Baseline: 07/10/22: 10 degrees Goal status: INITIAL  4.  Pt will improve R knee AROM TKE by at least 5 degrees to assist in normalized gait mechanics to assist in offloading L ankle in weightbearing positions to decrease L ankle pain Baseline: 07/10/22: Rigid gait, Increased weight shift onto LLE in stance phase with L lateral lean due to R knee extension limitations. Lacking 11 degrees R knee from TKE. Goal status: INITIAL  PLAN:  PT FREQUENCY: 2x/week  PT DURATION: 8 weeks  PLANNED INTERVENTIONS: Therapeutic exercises, Therapeutic activity, Neuromuscular re-education, Balance training, Gait training, Patient/Family education, Joint mobilization, Aquatic Therapy, Dry Needling, Cryotherapy, Moist heat, Manual therapy, and Re-evaluation  PLAN FOR NEXT SESSION: continue POC   8:42 AM, 07/30/22 Etta Grandchild, PT, DPT Physical Therapist - [facility]  [phone] ([place])    07/30/2022, 8:42 AM

## 2022-08-01 ENCOUNTER — Encounter: Payer: Self-pay | Admitting: Physical Therapy

## 2022-08-01 ENCOUNTER — Ambulatory Visit: Payer: PPO | Admitting: Physical Therapy

## 2022-08-01 DIAGNOSIS — R262 Difficulty in walking, not elsewhere classified: Secondary | ICD-10-CM

## 2022-08-01 DIAGNOSIS — M25572 Pain in left ankle and joints of left foot: Secondary | ICD-10-CM

## 2022-08-01 NOTE — Therapy (Signed)
OUTPATIENT PHYSICAL THERAPY TREATMENT   Patient Name: James Hood MRN: [id number] DOB:Oct 05, 1940, 82 y.o., male Today's Date: 08/01/2022  END OF SESSION:  PT End of Session - 08/01/22 0837     Visit Number 5    Number of Visits 17    Date for PT Re-Evaluation 09/04/22    Authorization Type [employer]    Authorization Time Period 07/10/22-09/04/22    Authorization - Visit Number 5    Authorization - Number of Visits 10    Progress Note Due on Visit 10    PT Start Time 0833    PT Stop Time 0911    PT Time Calculation (min) 38 min    Activity Tolerance Patient tolerated treatment well;No increased pain    Behavior During Therapy WFL for tasks assessed/performed                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   Colon polyps    Dementia ([place])    Depression    Diabetes mellitus type 2 in nonobese (HCC)    GERD (gastroesophageal reflux disease)    BARRETTS ESOPHAGUS RESOLVED PER PATIENT   Headache    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[place])    History of hiatal hernia    Hypercholesteremia    Hypertension    Hyperthyroidism    Neuropathic pain    Primary localized osteoarthritis of right knee    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   Stomach ulcer    Tremor, essential    Wound infection complicating hardware ([place]) 04/02/2018   Past Surgical History:  Procedure Laterality Date   ANKLE ARTHROSCOPY Left 09/29/2019   Procedure: LEFT ANKLE ARTHROSCOPY, DEBRIDEMENT;  Surgeon: Newt Minion, MD;  Location: [place];  Service: Orthopedics;  Laterality: Left;   BACK SURGERY     COLONOSCOPY WITH PROPOFOL N/A 09/14/2016   Procedure: COLONOSCOPY WITH PROPOFOL;  Surgeon: Manya Silvas, MD;  Location: [facility] ENDOSCOPY;  Service: Endoscopy;  Laterality: N/A;   esophageal stretch      ESOPHAGOGASTRODUODENOSCOPY (EGD) WITH PROPOFOL N/A 12/13/2020   Procedure: ESOPHAGOGASTRODUODENOSCOPY (EGD) WITH PROPOFOL;  Surgeon: Lesly Rubenstein, MD;  Location: [facility] ENDOSCOPY;  Service: Endoscopy;  Laterality: N/A;  IDDM   JOINT REPLACEMENT Right 2016   knee   LUMBAR LAMINECTOMY/DECOMPRESSION MICRODISCECTOMY Left 02/03/2016   Procedure: LEFT L5-S1 DISKECTOMY;  Surgeon: Leeroy Cha, MD;  Location: [place] NEURO ORS;  Service: Neurosurgery;  Laterality: Left;  LEFT L5-S1 DISKECTOMY   PULSE GENERATOR IMPLANT N/A 08/21/2017   Procedure: UNILATERAL PULSE GENERATOR IMPLANT;  Surgeon: Meade Maw, MD;  Location: [facility] ORS;  Service: Neurosurgery;  Laterality: N/A;   PULSE GENERATOR IMPLANT Right 04/02/2018   Procedure: REMOVAL OF PULSE GENERATOR IMPLANT AND LEADS;  Surgeon: Meade Maw, MD;  Location: [facility] ORS;  Service: Neurosurgery;  Laterality: Right;   TONSILLECTOMY     TOTAL KNEE ARTHROPLASTY Right 11/15/2014   Procedure: TOTAL KNEE ARTHROPLASTY;  Surgeon: Elsie Saas, MD;  Location: [place];  Service: Orthopedics;  Laterality: Right;   Patient Active Problem List   Diagnosis Date Noted   Peroneal tendinitis, right 07/20/2022   Numbness and tingling in left hand 07/11/2022   Hypoxia 06/27/2022   Hypersomnia 05/24/2022   Chronic cough 11/10/2021   Pressure injury of buttock, stage 1 08/08/2021  RLS (restless legs syndrome) 06/26/2021   Bilateral leg pain 06/26/2021   Primary osteoarthritis of left shoulder 01/25/2021   Chronic, continuous use of opioids 11/15/2020   Therapeutic opioid-induced constipation (OIC) 11/15/2020   Fatigue 10/11/2020   OAB (overactive bladder) 08/08/2020   Prolonged Q-T interval on ECG 06/08/2020   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   Osteochondral defect of ankle    Chronic pain of left ankle 02/27/2019   Vitamin D deficiency 02/18/2019   Chronic left shoulder pain 02/02/2019   Neuropathy 09/24/2018   Lumbosacral radiculopathy 01/14/2018    Primary osteoarthritis of left ankle 01/14/2018   Lumbar facet joint syndrome 01/14/2018   Chronic pain syndrome 08/26/2017   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   Thyroid nodule 02/27/2017   Fatty liver 02/27/2017   Right hip pain 08/16/2016   Lumbar herniated disc 02/03/2016   Anemia 12/09/2015   Falls 09/16/2015   Barrett's esophagus 09/16/2015   Encounter for general adult medical examination with abnormal findings 09/16/2015   Benign prostatic hyperplasia 09/16/2015   Trochanteric bursitis of left hip 09/09/2015   Difficulty in walking 06/01/2015   HLD (hyperlipidemia) 02/23/2015   DJD (degenerative joint disease) of knee 11/15/2014   Hypertension    Tremor, essential    Primary localized osteoarthritis of right knee    Anxiety and depression    Tobacco abuse 11/18/2013   Chronic diarrhea 11/12/2013   Benign neoplasm of colon 08/25/2013   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   Type 2 diabetes mellitus ([place]) 10/06/2012    PCP: Tommi Rumps, MD  REFERRING PROVIDER: Rosette Reveal  REFERRING DIAG: M95.8 (ICD-10-CM) - Osteochondral defect of ankle M19.072 (ICD-10-CM) - Primary osteoarthritis of left ankle M76.71 (ICD-10-CM) - Peroneal tendinitis, right  THERAPY DIAG:  Pain in left ankle and joints of left foot  Difficulty in walking, not elsewhere classified  Rationale for Evaluation and Treatment: Rehabilitation  ONSET DATE: Has had years of L ankle pain  SUBJECTIVE:   SUBJECTIVE STATEMENT: Pt reports he has been really sore since last visit, thinks this is due to the exercise with "the weights on my feet". Reports 6/10 pain with this.    PERTINENT HISTORY: Pt is a 82 y.o. male referred to OPPT for L ankle pain. Pt reports seeing Dr. Zigmund Daniel planning for L ankle injections trying to avoid surgical intervention. Has yet to have any steroid injections, planning for one this upcoming Wednesday, 07/18/22. Pt's pain described as achey, radiates up to his knee  along the lateral portion. Worst pain described upwards to 7-8/10 NPS. Best pain 7-8/10 NPS. Currently 5/10 NPS. Typically pain more mild in the mornings as he has not walked a lot. Pain is located at lateral malleolus, slightly inferior and lateral. Pain is worsened with standing and walking. Can tolerate about 5 minutes or less of both before onset of significant pain. 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  PAIN:  Are you having pain? Yes: NPRS scale: 5/10 Pain location: L ankle  Pain description: dull, achey Aggravating factors: standing/walker Relieving factors: N/A, maybe rest  PRECAUTIONS: None  WEIGHT BEARING RESTRICTIONS: No  FALLS:  Has patient fallen in last 6 months? Yes. Number of falls 1, tripped on steps going up    PATIENT GOALS: Wants to strengthen L ankle, improve pain. Be able to stand and walk for longer periods of time.   NEXT MD VISIT: 07/18/22   OBJECTIVE:  TODAY'S TREATMENT:                                                                                                                              DATE: 08/01/22  Therex:  - Nustep seat 11 UE 12 L3 90mns for gentle L ankle mobility and strengthening; SPM 85-90 throughout - Heel slides (sock on step) x20 3sec hold each direction (more limited in plantarflexion ROM) - L semi tandem wt'd ball toss/catch 2x 12 with PT throwing ball slightly outside BOS with good carry over -Bilat heel raise 2 x12 with cuing for eccentric lower - Standing weight shifting alt 2x 20 with 1 finger support with cuing for technique with good carry over - Standing gastroc stretch 1 x 30 sec hold - Standing ant tib stretch  1 x 30 sec hold   PATIENT EDUCATION:  Education details: Prognosis, POC Person educated: Patient Education method: Explanation Education comprehension: verbalized understanding  HOME EXERCISE PROGRAM: - Standing  Gastroc Stretch (2-3x/day; 7x/week) with 30 sec holds - Standing Soleus Stretch (2-3x/day; 7x/week) with 30 sec holds - Seated Heel Slide (2-3x/day; 7x/week) with 3 sec holds 12-20 reps - SLB (1x/day; 1x/week) with 30 sec hold  ASSESSMENT:  CLINICAL IMPRESSION: PT continued therex progression for increased ankle ROM and lower leg stabilization and motor control. Pt is able to comply with all cuing for proper technique of therex with excellent motivation throughout session. Stretch, discomfort pain, all more related to Left anterior compartment and limited tissue length at dorsal extensors causing more difficult. Pt would benefit from skilled PT to continue decrease pain and increase gross cervical and shoulder impairments to return to PLOF and increase QoL.  OBJECTIVE IMPAIRMENTS: Abnormal gait, decreased activity tolerance, decreased balance, decreased mobility, difficulty walking, decreased ROM, decreased strength, hypomobility, impaired flexibility, improper body mechanics, postural dysfunction, and pain.   ACTIVITY LIMITATIONS: carrying, sitting, standing, squatting, stairs, and locomotion level  PARTICIPATION LIMITATIONS: shopping, community activity, and occupation  PERSONAL FACTORS: Age, Past/current experiences, Time since onset of injury/illness/exacerbation, and 3+ comorbidities: Anxiety/depression, dementia, DM type 2, HTN, R knee OA/ TKA, CPS, B foot neuropathy  are also affecting patient's functional outcome.   REHAB POTENTIAL: Fair Chronicity of symptoms, age  CLINICAL DECISION MAKING: Evolving/moderate complexity  EVALUATION COMPLEXITY: Moderate   GOALS: Goals reviewed with patient? No  SHORT TERM GOALS: Target date: 08/07/22 Pt will be independent with HEP to improve R knee, L ankle impairments to improve gait mechanics for improved tolerance for community distances.  Baseline:07/10/22: Deferred to next session Goal status: INITIAL  LONG TERM GOALS: Target date:  09/04/22  Pt will improve FOTO to target score or greater to demonstrate clinically significant improvement in functional mobility.  Baseline: 07/10/22: 42 with target of 52 Goal status: INITIAL  2.  Pt will improve 2 MWT to norms for community dwelling older adults with </= 5/10 pain in L ankle with gait to demonstrate clinically significant improvement to complete community distances  with less pain. Baseline: 07/10/22: 256' with 7-8/10 ankle pain. Norm for 2 MWT is 496'. Goal status: INITIAL  3.  Pt will improve L ankle DF AROM by at least 5 degrees to improve L ankle mobility for improved swing phase in gait for falls risk reduction and improved L ankle pain in L ankle OA.  Baseline: 07/10/22: 10 degrees Goal status: INITIAL  4.  Pt will improve R knee AROM TKE by at least 5 degrees to assist in normalized gait mechanics to assist in offloading L ankle in weightbearing positions to decrease L ankle pain Baseline: 07/10/22: Rigid gait, Increased weight shift onto LLE in stance phase with L lateral lean due to R knee extension limitations. Lacking 11 degrees R knee from TKE. Goal status: INITIAL  PLAN:  PT FREQUENCY: 2x/week  PT DURATION: 8 weeks  PLANNED INTERVENTIONS: Therapeutic exercises, Therapeutic activity, Neuromuscular re-education, Balance training, Gait training, Patient/Family education, Joint mobilization, Aquatic Therapy, Dry Needling, Cryotherapy, Moist heat, Manual therapy, and Re-evaluation  PLAN FOR NEXT SESSION: continue POC   11:41 AM, 08/01/22 Etta Grandchild, PT, DPT Physical Therapist - [facility]  [phone] ([place])    08/01/2022, 11:41 AM

## 2022-08-03 ENCOUNTER — Other Ambulatory Visit (INDEPENDENT_AMBULATORY_CARE_PROVIDER_SITE_OTHER): Payer: PPO | Admitting: Radiology

## 2022-08-03 ENCOUNTER — Encounter: Payer: Self-pay | Admitting: Family Medicine

## 2022-08-03 ENCOUNTER — Ambulatory Visit (INDEPENDENT_AMBULATORY_CARE_PROVIDER_SITE_OTHER): Payer: PPO | Admitting: Family Medicine

## 2022-08-03 VITALS — HR 65 | Wt 166.6 lb

## 2022-08-03 DIAGNOSIS — M19072 Primary osteoarthritis, left ankle and foot: Secondary | ICD-10-CM

## 2022-08-03 DIAGNOSIS — M7672 Peroneal tendinitis, left leg: Secondary | ICD-10-CM

## 2022-08-03 MED ORDER — TRIAMCINOLONE ACETONIDE 40 MG/ML IJ SUSP
40.0000 mg | Freq: Once | INTRAMUSCULAR | Status: AC
  Administered 2022-08-03: 40 mg via INTRAMUSCULAR

## 2022-08-03 NOTE — Patient Instructions (Addendum)
You have just been given a cortisone injection to reduce pain and inflammation. After the injection you may notice immediate relief of pain as a result of the Lidocaine. It is important to rest the area of the injection for 24 to 48 hours after the injection. There is a possibility of some temporary increased discomfort and swelling for up to 72 hours until the cortisone begins to work. If you do have pain, simply rest the joint and use ice. If you can tolerate over the counter medications, you can try Tylenol for added relief per package instructions. - As above, relative rest x 2 days then gradual return to restricted activity as follows - Use cam boot at all times while on your feet, okay to remove for periods of rest (sitting), bathing, sleeping - Continue with physical therapy and home exercises (okay to perform nonweightbearing and limited resistance exercises) - Return in 2 weeks

## 2022-08-03 NOTE — Assessment & Plan Note (Addendum)
Patient returns for follow-up to chronic left ankle pain in the setting of osteoarthritis and secondary / compensatory peroneal tendinitis. At last visit James Hood tolerated an ankle intra-articular injection with 75%+ improvement reported though short-lived; 2 weeks or so. James Hood return for next steps.  We discussed continued non-surgical vs surgical management and James Hood has elected to proceed with ultrasound guided repeat intra-articular ankle joint injection and adjunct peroneal tendon sheath injections, given the tendon injections, James Hood was placed into a short CAM boot

## 2022-08-05 NOTE — Assessment & Plan Note (Signed)
See additional assessment(s) for plan details. 

## 2022-08-05 NOTE — Progress Notes (Signed)
     Primary Care / Sports Medicine Office Visit  Patient Information:  Patient ID: James Hood, male DOB: [birth year] Age: 82 y.o. MRN: [id number]   James Hood is a pleasant 82 y.o. male presenting with the following:  No chief complaint on file.   Vitals:   08/03/22 1327  Pulse: 65  SpO2: 97%   Vitals:   08/03/22 1327  Weight: 166 lb 9.6 oz (75.6 kg)   Body mass index is 23.9 kg/m.     Independent interpretation of notes and tests performed by another provider:   None  Procedures performed:   Procedure:  Injection of left ankle joint under ultrasound guidance. Ultrasound guidance utilized for in-plane approach, trace hypoechoic region consistent with effusion noted Samsung HS60 device utilized with permanent recording / reporting. Verbal informed consent obtained and verified. Skin prepped in a sterile fashion. Ethyl chloride for topical local analgesia.  Completed without difficulty and tolerated well. Medication: triamcinolone acetonide 40 mg/mL suspension for injection 1 mL total and 2 mL lidocaine 1% without epinephrine utilized for needle placement anesthetic Advised to contact for fevers/chills, erythema, induration, drainage, or persistent bleeding.  Procedure:  Injections of peroneal tendon sheats under ultrasound guidance. Ultrasound guidance utilized for out-of-plane approaches to the peroneal tendons, no peritendinous fluid noted. Samsung HS60 device utilized with permanent recording / reporting. Verbal informed consent obtained and verified. Skin prepped in a sterile fashion. Ethyl chloride for topical local analgesia.  Completed without difficulty and tolerated well. Medication: triamcinolone acetonide 40 mg/mL suspension for injection 1 mL total and 2 mL lidocaine 1% without epinephrine utilized for needle placement anesthetic Advised to contact for fevers/chills, erythema, induration, drainage, or persistent bleeding.   Pertinent  History, Exam, Impression, and Recommendations:   Primary osteoarthritis of left ankle Assessment & Plan: Patient returns for follow-up to chronic left ankle pain in the setting of osteoarthritis and secondary / compensatory peroneal tendinitis. At last visit he tolerated an ankle intra-articular injection with 75%+ improvement reported though short-lived; 2 weeks or so. He return for next steps.  We discussed continued non-surgical vs surgical management and he has elected to proceed with ultrasound guided repeat intra-articular ankle joint injection and adjunct peroneal tendon sheath injections, given the tendon injections, he was placed into a short CAM boot with strict restrictions, risk of tendon rupture relayed and patient vocalized understanding.  He will check in with PT and they can begin gradual wean from CAM and exercises until return. Recalcitrant symptoms to be addressed with surgical referral.  Orders: -     [place] LIMITED JOINT SPACE STRUCTURES LOW LEFT; Future -     Triamcinolone Acetonide  Peroneal tendonitis, left Assessment & Plan: See additional assessment(s) for plan details.  Orders: -     [place] LIMITED JOINT SPACE STRUCTURES LOW LEFT; Future -     Triamcinolone Acetonide     Orders & Medications Meds ordered this encounter  Medications   triamcinolone acetonide (KENALOG-40) injection 40 mg   Orders Placed This Encounter  Procedures   [place] LIMITED JOINT SPACE STRUCTURES LOW LEFT     Return in about 2 weeks (around 08/17/2022).     Montel Culver, MD, [facility]   Primary Care Sports Medicine Primary Care and Sports Medicine at [facility]

## 2022-08-06 ENCOUNTER — Ambulatory Visit: Payer: PPO | Admitting: Physical Therapy

## 2022-08-06 ENCOUNTER — Encounter: Payer: Self-pay | Admitting: Physical Therapy

## 2022-08-06 DIAGNOSIS — M6281 Muscle weakness (generalized): Secondary | ICD-10-CM

## 2022-08-06 DIAGNOSIS — M25572 Pain in left ankle and joints of left foot: Secondary | ICD-10-CM | POA: Diagnosis not present

## 2022-08-06 NOTE — Therapy (Signed)
OUTPATIENT PHYSICAL THERAPY TREATMENT   Patient Name: James Hood MRN: [id number] DOB:1941-01-16, 82 y.o., male Today's Date: 08/09/2022  END OF SESSION:       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   Colon polyps    Dementia ([place])    Depression    Diabetes mellitus type 2 in nonobese (HCC)    GERD (gastroesophageal reflux disease)    BARRETTS ESOPHAGUS RESOLVED PER PATIENT   Headache    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[place])    History of hiatal hernia    Hypercholesteremia    Hypertension    Hyperthyroidism    Neuropathic pain    Primary localized osteoarthritis of right knee    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   Stomach ulcer    Tremor, essential    Wound infection complicating hardware ([place]) 04/02/2018   Past Surgical History:  Procedure Laterality Date   ANKLE ARTHROSCOPY Left 09/29/2019   Procedure: LEFT ANKLE ARTHROSCOPY, DEBRIDEMENT;  Surgeon: Newt Minion, MD;  Location: [place];  Service: Orthopedics;  Laterality: Left;   BACK SURGERY     COLONOSCOPY WITH PROPOFOL N/A 09/14/2016   Procedure: COLONOSCOPY WITH PROPOFOL;  Surgeon: Manya Silvas, MD;  Location: [facility] ENDOSCOPY;  Service: Endoscopy;  Laterality: N/A;   esophageal stretch     ESOPHAGOGASTRODUODENOSCOPY (EGD) WITH PROPOFOL N/A 12/13/2020   Procedure: ESOPHAGOGASTRODUODENOSCOPY (EGD) WITH PROPOFOL;  Surgeon: Lesly Rubenstein, MD;  Location: [facility] ENDOSCOPY;  Service: Endoscopy;  Laterality: N/A;  IDDM   JOINT REPLACEMENT Right 2016   knee   LUMBAR LAMINECTOMY/DECOMPRESSION MICRODISCECTOMY Left 02/03/2016   Procedure: LEFT L5-S1 DISKECTOMY;  Surgeon: Leeroy Cha, MD;  Location: [place] NEURO ORS;  Service: Neurosurgery;  Laterality: Left;  LEFT L5-S1 DISKECTOMY   PULSE GENERATOR IMPLANT N/A 08/21/2017   Procedure:  UNILATERAL PULSE GENERATOR IMPLANT;  Surgeon: Meade Maw, MD;  Location: [facility] ORS;  Service: Neurosurgery;  Laterality: N/A;   PULSE GENERATOR IMPLANT Right 04/02/2018   Procedure: REMOVAL OF PULSE GENERATOR IMPLANT AND LEADS;  Surgeon: Meade Maw, MD;  Location: [facility] ORS;  Service: Neurosurgery;  Laterality: Right;   TONSILLECTOMY     TOTAL KNEE ARTHROPLASTY Right 11/15/2014   Procedure: TOTAL KNEE ARTHROPLASTY;  Surgeon: Elsie Saas, MD;  Location: [employer];  Service: Orthopedics;  Laterality: Right;   Patient Active Problem List   Diagnosis Date Noted   Peroneal tendinitis, right 07/20/2022   Numbness and tingling in left hand 07/11/2022   Hypoxia 06/27/2022   Hypersomnia 05/24/2022   Chronic cough 11/10/2021   Pressure injury of buttock, stage 1 08/08/2021   RLS (restless legs syndrome) 06/26/2021   Bilateral leg pain 06/26/2021   Primary osteoarthritis of left shoulder 01/25/2021   Chronic, continuous use of opioids 11/15/2020   Therapeutic opioid-induced constipation (OIC) 11/15/2020   Fatigue 10/11/2020   OAB (overactive bladder) 08/08/2020   Prolonged Q-T interval on ECG 06/08/2020   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   Osteochondral defect of ankle    Chronic pain of left ankle 02/27/2019   Vitamin D deficiency 02/18/2019   Chronic left shoulder pain 02/02/2019   Neuropathy 09/24/2018   Lumbosacral radiculopathy 01/14/2018   Primary osteoarthritis of left ankle 01/14/2018   Lumbar facet joint syndrome 01/14/2018   Chronic pain syndrome 08/26/2017   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  Thyroid nodule 02/27/2017   Fatty liver 02/27/2017   Right hip pain 08/16/2016   Lumbar herniated disc 02/03/2016   Anemia 12/09/2015   Falls 09/16/2015   Barrett's esophagus 09/16/2015   Encounter for general adult medical examination with abnormal findings 09/16/2015   Benign prostatic hyperplasia 09/16/2015   Trochanteric bursitis of left hip 09/09/2015   Difficulty in  walking 06/01/2015   HLD (hyperlipidemia) 02/23/2015   DJD (degenerative joint disease) of knee 11/15/2014   Hypertension    Tremor, essential    Primary localized osteoarthritis of right knee    Anxiety and depression    Tobacco abuse 11/18/2013   Chronic diarrhea 11/12/2013   Benign neoplasm of colon 08/25/2013   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 Reports he is supposed to wear this boot for 2 weeks. Pain has been better since Friday, reports it is 3/10 this am.    PERTINENT HISTORY: Pt is a 82 y.o. male referred to OPPT for L ankle pain. Pt reports seeing Dr. Zigmund Daniel planning for L ankle injections trying to avoid surgical intervention. Has yet to have any steroid injections, planning for one this upcoming Wednesday, 07/18/22. Pt's pain described as achey, radiates up to his knee along the lateral portion. Worst pain described upwards to 7-8/10 NPS. Best pain 7-8/10 NPS. Currently 5/10 NPS. Typically pain more mild in the mornings as he has not walked a lot. Pain is located at lateral malleolus, slightly inferior and lateral. Pain is worsened with standing and walking. Can tolerate about 5 minutes or less of both before onset of significant pain. 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  PAIN:  Are you  having pain? Yes: NPRS scale: 5/10 Pain location: L ankle  Pain description: dull, achey Aggravating factors: standing/walker Relieving factors: N/A, maybe rest  PRECAUTIONS: None  WEIGHT BEARING RESTRICTIONS: No  FALLS:  Has patient fallen in last 6 months? Yes. Number of falls 1, tripped on steps going up    PATIENT GOALS: Wants to strengthen L ankle, improve pain. Be able to stand and walk for longer periods of time.   NEXT MD VISIT: 07/18/22   OBJECTIVE:    TODAY'S TREATMENT:  DATE: 08/09/22  Therex:  - Nustep seat 11 UE 12 L3 73mins for gentle L ankle mobility and strengthening; SPM 85-90 throughout - Heel slides (sock on step) x20 3sec hold each direction 15-54d Ankle ABCs attempting to maintain heel contact to step  - DF with GTB x12 in sitting - PF with GTB x12 - Seated gastroc stretch with strap 30secH - Seated GT stretch 30secH  Manual Subtalar distraction 10sec on/off  x12 Talocrural G3 Anterior glide with PF movement 30sec bouts x6 bouts; posterior glide with DF movement 30sec bouts x6 Intertarsal/tarsometatarsal plantar glide 30sec bouts x4 each STM with trigger point release to plantar fascia/deep intrinsics x78mins   PATIENT EDUCATION:  Education details: Prognosis, POC Person educated: Patient Education method: Explanation Education comprehension: verbalized understanding  HOME EXERCISE PROGRAM: - Psychologist, counselling (2-3x/day; 7x/week) with 30 sec holds - Standing Soleus Stretch (2-3x/day; 7x/week) with 30 sec holds - Seated Heel Slide (2-3x/day; 7x/week) with 3 sec holds 12-20 reps - SLB (1x/day; 1x/week) with 30 sec hold  ASSESSMENT:  CLINICAL IMPRESSION: PT continued therex progression for increased ankle ROM and lower leg stabilization and motor control. Pt is able to comply with all cuing for proper technique of  therex with excellent motivation throughout session. Stretch, discomfort pain, all more related to Left anterior compartment and limited tissue length at dorsal extensors causing more difficult. Pt would benefit from skilled PT to continue decrease pain and increase gross cervical and shoulder impairments to return to PLOF and increase QoL.  OBJECTIVE IMPAIRMENTS: Abnormal gait, decreased activity tolerance, decreased balance, decreased mobility, difficulty walking, decreased ROM, decreased strength, hypomobility, impaired flexibility, improper body mechanics, postural dysfunction, and pain.   ACTIVITY LIMITATIONS: carrying, sitting, standing, squatting, stairs, and locomotion level  PARTICIPATION LIMITATIONS: shopping, community activity, and occupation  PERSONAL FACTORS: Age, Past/current experiences, Time since onset of injury/illness/exacerbation, and 3+ comorbidities: Anxiety/depression, dementia, DM type 2, HTN, R knee OA/ TKA, CPS, B foot neuropathy  are also affecting patient's functional outcome.   REHAB POTENTIAL: Fair Chronicity of symptoms, age  CLINICAL DECISION MAKING: Evolving/moderate complexity  EVALUATION COMPLEXITY: Moderate   GOALS: Goals reviewed with patient? No  SHORT TERM GOALS: Target date: 08/07/22 Pt will be independent with HEP to improve R knee, L ankle impairments to improve gait mechanics for improved tolerance for community distances.  Baseline:07/10/22: Deferred to next session Goal status: INITIAL  LONG TERM GOALS: Target date: 09/04/22  Pt will improve FOTO to target score or greater to demonstrate clinically significant improvement in functional mobility.  Baseline: 07/10/22: 42 with target of 52 Goal status: INITIAL  2.  Pt will improve 2 MWT to norms for community dwelling older adults with </= 5/10 pain in L ankle with gait to demonstrate clinically significant improvement to complete community distances with less pain. Baseline: 07/10/22: 256' with  7-8/10 ankle pain. Norm for 2 MWT is 496'. Goal status: INITIAL  3.  Pt will improve L ankle DF AROM by at least 5 degrees to improve L ankle mobility for improved swing phase in gait for falls risk reduction and improved L ankle pain in L ankle OA.  Baseline: 07/10/22: 10 degrees Goal status: INITIAL  4.  Pt will improve R knee AROM TKE by at least 5 degrees to assist in normalized gait mechanics to assist in offloading L ankle in weightbearing positions to decrease L ankle pain Baseline: 07/10/22: Rigid gait, Increased weight shift onto LLE in stance phase with L lateral lean due to R knee extension  limitations. Lacking 11 degrees R knee from TKE. Goal status: INITIAL  PLAN:  PT FREQUENCY: 2x/week  PT DURATION: 8 weeks  PLANNED INTERVENTIONS: Therapeutic exercises, Therapeutic activity, Neuromuscular re-education, Balance training, Gait training, Patient/Family education, Joint mobilization, Aquatic Therapy, Dry Needling, Cryotherapy, Moist heat, Manual therapy, and Re-evaluation  PLAN FOR NEXT SESSION: continue POC   10:06 AM, 08/09/22 Etta Grandchild, PT, DPT Physical Therapist - [facility]  [phone] ([place])    08/09/2022, 10:06 AM

## 2022-08-08 ENCOUNTER — Encounter: Payer: Self-pay | Admitting: Physical Therapy

## 2022-08-08 ENCOUNTER — Ambulatory Visit: Payer: PPO | Admitting: Physical Therapy

## 2022-08-08 DIAGNOSIS — M25572 Pain in left ankle and joints of left foot: Secondary | ICD-10-CM

## 2022-08-08 NOTE — Therapy (Signed)
OUTPATIENT PHYSICAL THERAPY TREATMENT   Patient Name: James Hood MRN: [id number] DOB:[birth year], 82 y.o., male Today's Date: 08/09/2022  END OF SESSION:  PT End of Session - 08/08/22 0837     Visit Number 7    Number of Visits 17    Date for PT Re-Evaluation 09/04/22    Authorization Type [employer]    Authorization Time Period 07/10/22-09/04/22    Authorization - Visit Number 7    Authorization - Number of Visits 10    Progress Note Due on Visit 10    PT Start Time 0833    PT Stop Time 0911    PT Time Calculation (min) 38 min    Equipment Utilized During Treatment Gait belt    Activity Tolerance Patient tolerated treatment well;No increased pain    Behavior During Therapy WFL for tasks assessed/performed                 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   Colon polyps    Dementia ([place])    Depression    Diabetes mellitus type 2 in nonobese (HCC)    GERD (gastroesophageal reflux disease)    BARRETTS ESOPHAGUS RESOLVED PER PATIENT   Headache    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[place])    History of hiatal hernia    Hypercholesteremia    Hypertension    Hyperthyroidism    Neuropathic pain    Primary localized osteoarthritis of right knee    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   Stomach ulcer    Tremor, essential    Wound infection complicating hardware ([place]) 04/02/2018   Past Surgical History:  Procedure Laterality Date   ANKLE ARTHROSCOPY Left 09/29/2019   Procedure: LEFT ANKLE ARTHROSCOPY, DEBRIDEMENT;  Surgeon: Newt Minion, MD;  Location: [place];  Service: Orthopedics;  Laterality: Left;   BACK SURGERY     COLONOSCOPY WITH PROPOFOL N/A 09/14/2016   Procedure: COLONOSCOPY WITH PROPOFOL;  Surgeon: Manya Silvas, MD;  Location: [facility] ENDOSCOPY;  Service:  Endoscopy;  Laterality: N/A;   esophageal stretch     ESOPHAGOGASTRODUODENOSCOPY (EGD) WITH PROPOFOL N/A 12/13/2020   Procedure: ESOPHAGOGASTRODUODENOSCOPY (EGD) WITH PROPOFOL;  Surgeon: Lesly Rubenstein, MD;  Location: [facility] ENDOSCOPY;  Service: Endoscopy;  Laterality: N/A;  IDDM   JOINT REPLACEMENT Right 2016   knee   LUMBAR LAMINECTOMY/DECOMPRESSION MICRODISCECTOMY Left 02/03/2016   Procedure: LEFT L5-S1 DISKECTOMY;  Surgeon: Leeroy Cha, MD;  Location: [place] NEURO ORS;  Service: Neurosurgery;  Laterality: Left;  LEFT L5-S1 DISKECTOMY   PULSE GENERATOR IMPLANT N/A 08/21/2017   Procedure: UNILATERAL PULSE GENERATOR IMPLANT;  Surgeon: Meade Maw, MD;  Location: [facility] ORS;  Service: Neurosurgery;  Laterality: N/A;   PULSE GENERATOR IMPLANT Right 04/02/2018   Procedure: REMOVAL OF PULSE GENERATOR IMPLANT AND LEADS;  Surgeon: Meade Maw, MD;  Location: [facility] ORS;  Service: Neurosurgery;  Laterality: Right;   TONSILLECTOMY     TOTAL KNEE ARTHROPLASTY Right 11/15/2014   Procedure: TOTAL KNEE ARTHROPLASTY;  Surgeon: Elsie Saas, MD;  Location: [place];  Service: Orthopedics;  Laterality: Right;   Patient Active Problem List   Diagnosis Date Noted   Peroneal tendinitis, right 07/20/2022   Numbness and tingling in left hand 07/11/2022   Hypoxia 06/27/2022   Hypersomnia 05/24/2022   Chronic cough  11/10/2021   Pressure injury of buttock, stage 1 08/08/2021   RLS (restless legs syndrome) 06/26/2021   Bilateral leg pain 06/26/2021   Primary osteoarthritis of left shoulder 01/25/2021   Chronic, continuous use of opioids 11/15/2020   Therapeutic opioid-induced constipation (OIC) 11/15/2020   Fatigue 10/11/2020   OAB (overactive bladder) 08/08/2020   Prolonged Q-T interval on ECG 06/08/2020   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   Osteochondral defect of ankle    Chronic pain of left ankle 02/27/2019   Vitamin D deficiency 02/18/2019   Chronic left shoulder pain 02/02/2019   Neuropathy  09/24/2018   Lumbosacral radiculopathy 01/14/2018   Primary osteoarthritis of left ankle 01/14/2018   Lumbar facet joint syndrome 01/14/2018   Chronic pain syndrome 08/26/2017   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   Thyroid nodule 02/27/2017   Fatty liver 02/27/2017   Right hip pain 08/16/2016   Lumbar herniated disc 02/03/2016   Anemia 12/09/2015   Falls 09/16/2015   Barrett's esophagus 09/16/2015   Encounter for general adult medical examination with abnormal findings 09/16/2015   Benign prostatic hyperplasia 09/16/2015   Trochanteric bursitis of left hip 09/09/2015   Difficulty in walking 06/01/2015   HLD (hyperlipidemia) 02/23/2015   DJD (degenerative joint disease) of knee 11/15/2014   Hypertension    Tremor, essential    Primary localized osteoarthritis of right knee    Anxiety and depression    Tobacco abuse 11/18/2013   Chronic diarrhea 11/12/2013   Benign neoplasm of colon 08/25/2013   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 Has not completed much HEP other than the foot alphabet yesterday.    PERTINENT HISTORY: Pt is a 82 y.o. male referred to OPPT for L ankle pain. Pt reports seeing Dr. Zigmund Daniel planning for L ankle injections trying to avoid surgical intervention. Has yet to have any steroid injections, planning for one this upcoming Wednesday, 07/18/22. Pt's pain described as achey, radiates  up to his knee along the lateral portion. Worst pain described upwards to 7-8/10 NPS. Best pain 7-8/10 NPS. Currently 5/10 NPS. Typically pain more mild in the mornings as he has not walked a lot. Pain is located at lateral malleolus, slightly inferior and lateral. Pain is worsened with standing and walking. Can tolerate about 5 minutes or less of both before onset of significant pain. 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  PAIN:  Are you having pain? Yes: NPRS scale: 5/10 Pain location: L ankle  Pain description: dull, achey Aggravating factors: standing/walker Relieving factors: N/A, maybe rest  PRECAUTIONS: None  WEIGHT BEARING RESTRICTIONS: No  FALLS:  Has patient fallen in last 6 months? Yes. Number of falls 1, tripped on steps going up    PATIENT GOALS: Wants to strengthen L ankle, improve pain. Be able to stand and walk for longer periods of time.   NEXT MD VISIT:  07/18/22   OBJECTIVE:    TODAY'S TREATMENT:                                                                                                                              DATE: 08/09/22  Therex:  - Nustep seat 11 UE 12 L3 57mins for gentle L ankle mobility and strengthening; SPM 85-90 throughout - BAPS L4 toe touch <> heel touch x12 - BAPS L3(attempted L4 with limited success) clockwise circle touch; counterclockwise circle touch x12 each direction Seated heel raise x12 - Seated gastroc stretch with strap 30secH - Seated GT stretch 30secH  Manual Subtalar distraction 10sec on/off  x12 Talocrural G3 Anterior glide with PF movement 30sec bouts x6 bouts; posterior glide with DF movement 30sec bouts x6 Intertarsal/tarsometatarsal plantar glide 30sec bouts x4 each STM with trigger point release to plantar fascia/deep intrinsics x16mins   PATIENT EDUCATION:  Education details: Prognosis, POC Person educated:  Patient Education method: Explanation Education comprehension: verbalized understanding  HOME EXERCISE PROGRAM: - Psychologist, counselling (2-3x/day; 7x/week) with 30 sec holds - Standing Soleus Stretch (2-3x/day; 7x/week) with 30 sec holds - Seated Heel Slide (2-3x/day; 7x/week) with 3 sec holds 12-20 reps - SLB (1x/day; 1x/week) with 30 sec hold  ASSESSMENT:  CLINICAL IMPRESSION: PT continued therex progression for increased ankle ROM and lower leg stabilization and motor control. Pt is able to comply with all cuing for proper technique of therex with excellent motivation throughout session. Stretch, discomfort pain, all more related to Left anterior compartment and limited tissue length at dorsal extensors causing more difficult. Pt would benefit from skilled PT to continue decrease pain and increase gross cervical and shoulder impairments to return to PLOF and increase QoL.  OBJECTIVE IMPAIRMENTS: Abnormal gait, decreased activity tolerance, decreased balance, decreased mobility, difficulty walking, decreased ROM, decreased strength, hypomobility, impaired flexibility, improper body mechanics, postural dysfunction, and pain.   ACTIVITY LIMITATIONS: carrying, sitting, standing, squatting, stairs, and locomotion level  PARTICIPATION LIMITATIONS: shopping, community activity, and occupation  PERSONAL FACTORS: Age, Past/current experiences, Time since onset of injury/illness/exacerbation, and 3+ comorbidities: Anxiety/depression, dementia, DM type 2, HTN, R knee OA/ TKA, CPS, B foot neuropathy  are also affecting patient's functional outcome.   REHAB POTENTIAL: Fair Chronicity of symptoms, age  CLINICAL DECISION MAKING: Evolving/moderate complexity  EVALUATION COMPLEXITY: Moderate   GOALS: Goals reviewed with patient? No  SHORT TERM GOALS: Target date: 08/07/22 Pt will be independent with HEP to improve R knee, L ankle impairments to improve gait mechanics for improved tolerance for  community distances.  Baseline:07/10/22: Deferred to next session Goal status: INITIAL  LONG TERM GOALS: Target date: 09/04/22  Pt will improve FOTO to target score or greater to demonstrate clinically significant improvement in functional mobility.  Baseline: 07/10/22: 42 with target of 52 Goal status: INITIAL  2.  Pt will improve 2 MWT to norms for community dwelling older adults with </= 5/10 pain in L ankle with gait to demonstrate  clinically significant improvement to complete community distances with less pain. Baseline: 07/10/22: 256' with 7-8/10 ankle pain. Norm for 2 MWT is 496'. Goal status: INITIAL  3.  Pt will improve L ankle DF AROM by at least 5 degrees to improve L ankle mobility for improved swing phase in gait for falls risk reduction and improved L ankle pain in L ankle OA.  Baseline: 07/10/22: 10 degrees Goal status: INITIAL  4.  Pt will improve R knee AROM TKE by at least 5 degrees to assist in normalized gait mechanics to assist in offloading L ankle in weightbearing positions to decrease L ankle pain Baseline: 07/10/22: Rigid gait, Increased weight shift onto LLE in stance phase with L lateral lean due to R knee extension limitations. Lacking 11 degrees R knee from TKE. Goal status: INITIAL  PLAN:  PT FREQUENCY: 2x/week  PT DURATION: 8 weeks  PLANNED INTERVENTIONS: Therapeutic exercises, Therapeutic activity, Neuromuscular re-education, Balance training, Gait training, Patient/Family education, Joint mobilization, Aquatic Therapy, Dry Needling, Cryotherapy, Moist heat, Manual therapy, and Re-evaluation  PLAN FOR NEXT SESSION: continue POC     08/09/2022, 10:05 AM

## 2022-08-13 ENCOUNTER — Encounter: Payer: Self-pay | Admitting: Physical Therapy

## 2022-08-13 ENCOUNTER — Ambulatory Visit: Payer: PPO | Admitting: Physical Therapy

## 2022-08-13 DIAGNOSIS — M25572 Pain in left ankle and joints of left foot: Secondary | ICD-10-CM | POA: Diagnosis not present

## 2022-08-13 NOTE — Therapy (Signed)
OUTPATIENT PHYSICAL THERAPY TREATMENT   Patient Name: James Hood MRN: [id number] DOB:Sep 24, 1940, 82 y.o., male Today's Date: 08/13/2022  END OF SESSION:  PT End of Session - 08/13/22 0923     Visit Number 8    Number of Visits 17    Date for PT Re-Evaluation 09/04/22    Authorization Type [employer]    Authorization Time Period 07/10/22-09/04/22    Authorization - Visit Number 8    Authorization - Number of Visits 10    Progress Note Due on Visit 10    PT Start Time 0919    PT Stop Time 1000    PT Time Calculation (min) 41 min    Activity Tolerance Patient tolerated treatment well;No increased pain    Behavior During Therapy WFL for tasks assessed/performed                  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   Colon polyps    Dementia ([place])    Depression    Diabetes mellitus type 2 in nonobese (HCC)    GERD (gastroesophageal reflux disease)    BARRETTS ESOPHAGUS RESOLVED PER PATIENT   Headache    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[place])    History of hiatal hernia    Hypercholesteremia    Hypertension    Hyperthyroidism    Neuropathic pain    Primary localized osteoarthritis of right knee    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   Stomach ulcer    Tremor, essential    Wound infection complicating hardware ([place]) 04/02/2018   Past Surgical History:  Procedure Laterality Date   ANKLE ARTHROSCOPY Left 09/29/2019   Procedure: LEFT ANKLE ARTHROSCOPY, DEBRIDEMENT;  Surgeon: Newt Minion, MD;  Location: [place];  Service: Orthopedics;  Laterality: Left;   BACK SURGERY     COLONOSCOPY WITH PROPOFOL N/A 09/14/2016   Procedure: COLONOSCOPY WITH PROPOFOL;  Surgeon: Manya Silvas, MD;  Location: [facility] ENDOSCOPY;  Service: Endoscopy;  Laterality: N/A;   esophageal stretch      ESOPHAGOGASTRODUODENOSCOPY (EGD) WITH PROPOFOL N/A 12/13/2020   Procedure: ESOPHAGOGASTRODUODENOSCOPY (EGD) WITH PROPOFOL;  Surgeon: Lesly Rubenstein, MD;  Location: [facility] ENDOSCOPY;  Service: Endoscopy;  Laterality: N/A;  IDDM   JOINT REPLACEMENT Right 2016   knee   LUMBAR LAMINECTOMY/DECOMPRESSION MICRODISCECTOMY Left 02/03/2016   Procedure: LEFT L5-S1 DISKECTOMY;  Surgeon: Leeroy Cha, MD;  Location: [place] NEURO ORS;  Service: Neurosurgery;  Laterality: Left;  LEFT L5-S1 DISKECTOMY   PULSE GENERATOR IMPLANT N/A 08/21/2017   Procedure: UNILATERAL PULSE GENERATOR IMPLANT;  Surgeon: Meade Maw, MD;  Location: [facility] ORS;  Service: Neurosurgery;  Laterality: N/A;   PULSE GENERATOR IMPLANT Right 04/02/2018   Procedure: REMOVAL OF PULSE GENERATOR IMPLANT AND LEADS;  Surgeon: Meade Maw, MD;  Location: [facility] ORS;  Service: Neurosurgery;  Laterality: Right;   TONSILLECTOMY     TOTAL KNEE ARTHROPLASTY Right 11/15/2014   Procedure: TOTAL KNEE ARTHROPLASTY;  Surgeon: Elsie Saas, MD;  Location: [place];  Service: Orthopedics;  Laterality: Right;   Patient Active Problem List   Diagnosis Date Noted   Peroneal tendinitis, right 07/20/2022   Numbness and tingling in left hand 07/11/2022   Hypoxia 06/27/2022   Hypersomnia 05/24/2022   Chronic cough 11/10/2021   Pressure injury of buttock, stage  1 08/08/2021   RLS (restless legs syndrome) 06/26/2021   Bilateral leg pain 06/26/2021   Primary osteoarthritis of left shoulder 01/25/2021   Chronic, continuous use of opioids 11/15/2020   Therapeutic opioid-induced constipation (OIC) 11/15/2020   Fatigue 10/11/2020   OAB (overactive bladder) 08/08/2020   Prolonged Q-T interval on ECG 06/08/2020   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   Osteochondral defect of ankle    Chronic pain of left ankle 02/27/2019   Vitamin D deficiency 02/18/2019   Chronic left shoulder pain 02/02/2019   Neuropathy 09/24/2018   Lumbosacral radiculopathy 01/14/2018    Primary osteoarthritis of left ankle 01/14/2018   Lumbar facet joint syndrome 01/14/2018   Chronic pain syndrome 08/26/2017   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   Thyroid nodule 02/27/2017   Fatty liver 02/27/2017   Right hip pain 08/16/2016   Lumbar herniated disc 02/03/2016   Anemia 12/09/2015   Falls 09/16/2015   Barrett's esophagus 09/16/2015   Encounter for general adult medical examination with abnormal findings 09/16/2015   Benign prostatic hyperplasia 09/16/2015   Trochanteric bursitis of left hip 09/09/2015   Difficulty in walking 06/01/2015   HLD (hyperlipidemia) 02/23/2015   DJD (degenerative joint disease) of knee 11/15/2014   Hypertension    Tremor, essential    Primary localized osteoarthritis of right knee    Anxiety and depression    Tobacco abuse 11/18/2013   Chronic diarrhea 11/12/2013   Benign neoplasm of colon 08/25/2013   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 He is having 2/10 pain this am. Reports being out of the boot, still has a sore place on the top of his foot from it. Is not completing HEP.    PERTINENT HISTORY: Pt is a 82 y.o. male referred to OPPT for L ankle pain. Pt reports seeing Dr. Zigmund Daniel planning for L ankle injections trying to avoid surgical intervention. Has yet to have any steroid injections, planning for one this upcoming Wednesday, 07/18/22. Pt's pain described as achey, radiates up to his knee along the  lateral portion. Worst pain described upwards to 7-8/10 NPS. Best pain 7-8/10 NPS. Currently 5/10 NPS. Typically pain more mild in the mornings as he has not walked a lot. Pain is located at lateral malleolus, slightly inferior and lateral. Pain is worsened with standing and walking. Can tolerate about 5 minutes or less of both before onset of significant pain. 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  PAIN:  Are you having pain? Yes: NPRS scale: 5/10 Pain location: L ankle  Pain description: dull, achey Aggravating factors: standing/walker Relieving factors: N/A, maybe rest  PRECAUTIONS: None  WEIGHT BEARING RESTRICTIONS: No  FALLS:  Has patient fallen in last 6 months? Yes. Number of falls 1, tripped on steps going up    PATIENT GOALS: Wants to strengthen L ankle, improve pain. Be able to stand and walk for longer periods of time.   NEXT MD VISIT:  07/18/22   OBJECTIVE:    TODAY'S TREATMENT:                                                                                                                              DATE: 08/13/22  Therex:  - Nustep seat 11 UE 12 L3 79mins for gentle L ankle mobility and strengthening; SPM 85-90 throughout - BAPS L4 toe touch <> heel touch x12 - BAPS L3(attempted L4 with limited success) clockwise circle touch; counterclockwise circle touch x12 each direction - Standing DF <> PF on rocker board 2x 12 - Standing with L foot on mat table with RUE support for balance DF rock x12 (22d)  Standing toe raise x12 Standing heel raise x12 Standing toe raise <> heel raise (ankle rocker) x12  Carry over into ambulation 10ft with SPC with decent carry of of cuing for toe push off to heel strike   - Seated gastroc stretch with strap 30secH - Seated GT stretch 30secH    PATIENT EDUCATION:  Education details: Prognosis, POC Person educated: Patient Education method:  Explanation Education comprehension: verbalized understanding  HOME EXERCISE PROGRAM: - Standing Gastroc Stretch (2-3x/day; 7x/week) with 30 sec holds - Standing Soleus Stretch (2-3x/day; 7x/week) with 30 sec holds - Seated Heel Slide (2-3x/day; 7x/week) with 3 sec holds 12-20 reps - SLB (1x/day; 1x/week) with 30 sec hold  ASSESSMENT:  CLINICAL IMPRESSION: PT continued therex progression for increased ankle ROM and lower leg stabilization and motor control. Pt is able to comply with all cuing for proper technique of therex with excellent motivation throughout session. Pt with increased DF this session 22d AAROM, with continued difficulty with active range. Patient educated on carry over of ankle rocker mobility into gait with some carry over. Pt would benefit from skilled PT to continue decrease pain and increase gross cervical and shoulder impairments to return to PLOF and increase QoL.  OBJECTIVE IMPAIRMENTS: Abnormal gait, decreased activity tolerance, decreased balance, decreased mobility, difficulty walking, decreased ROM, decreased strength, hypomobility, impaired flexibility, improper body mechanics, postural dysfunction, and pain.   ACTIVITY LIMITATIONS: carrying, sitting, standing, squatting, stairs, and locomotion level  PARTICIPATION LIMITATIONS: shopping, community activity, and occupation  PERSONAL FACTORS: Age, Past/current experiences, Time since onset of injury/illness/exacerbation, and 3+ comorbidities: Anxiety/depression, dementia, DM type 2, HTN, R knee OA/ TKA, CPS, B foot neuropathy  are also affecting patient's functional outcome.   REHAB POTENTIAL: Fair Chronicity of symptoms, age  CLINICAL DECISION MAKING: Evolving/moderate complexity  EVALUATION COMPLEXITY: Moderate   GOALS: Goals reviewed with patient? No  SHORT TERM GOALS: Target date: 08/07/22 Pt will be independent with HEP to improve R knee, L ankle impairments to improve gait mechanics for improved  tolerance for community distances.  Baseline:07/10/22: Deferred to next session Goal status: INITIAL  LONG TERM GOALS: Target date: 09/04/22  Pt will improve FOTO to target score or greater to demonstrate clinically significant improvement in functional mobility.  Baseline: 07/10/22: 42 with target of  52 Goal status: INITIAL  2.  Pt will improve 2 MWT to norms for community dwelling older adults with </= 5/10 pain in L ankle with gait to demonstrate clinically significant improvement to complete community distances with less pain. Baseline: 07/10/22: 256' with 7-8/10 ankle pain. Norm for 2 MWT is 496'. Goal status: INITIAL  3.  Pt will improve L ankle DF AROM by at least 5 degrees to improve L ankle mobility for improved swing phase in gait for falls risk reduction and improved L ankle pain in L ankle OA.  Baseline: 07/10/22: 10 degrees Goal status: INITIAL  4.  Pt will improve R knee AROM TKE by at least 5 degrees to assist in normalized gait mechanics to assist in offloading L ankle in weightbearing positions to decrease L ankle pain Baseline: 07/10/22: Rigid gait, Increased weight shift onto LLE in stance phase with L lateral lean due to R knee extension limitations. Lacking 11 degrees R knee from TKE. Goal status: INITIAL  PLAN:  PT FREQUENCY: 2x/week  PT DURATION: 8 weeks  PLANNED INTERVENTIONS: Therapeutic exercises, Therapeutic activity, Neuromuscular re-education, Balance training, Gait training, Patient/Family education, Joint mobilization, Aquatic Therapy, Dry Needling, Cryotherapy, Moist heat, Manual therapy, and Re-evaluation  PLAN FOR NEXT SESSION: continue POC     08/13/2022, 12:18 PM

## 2022-08-15 ENCOUNTER — Ambulatory Visit: Payer: PPO | Admitting: Physical Therapy

## 2022-08-15 ENCOUNTER — Encounter: Payer: Self-pay | Admitting: Physical Therapy

## 2022-08-15 ENCOUNTER — Ambulatory Visit: Payer: PPO | Admitting: Podiatry

## 2022-08-15 DIAGNOSIS — G5792 Unspecified mononeuropathy of left lower limb: Secondary | ICD-10-CM

## 2022-08-15 DIAGNOSIS — M7752 Other enthesopathy of left foot: Secondary | ICD-10-CM | POA: Diagnosis not present

## 2022-08-15 DIAGNOSIS — M6281 Muscle weakness (generalized): Secondary | ICD-10-CM

## 2022-08-15 DIAGNOSIS — M25572 Pain in left ankle and joints of left foot: Secondary | ICD-10-CM | POA: Diagnosis not present

## 2022-08-15 DIAGNOSIS — B351 Tinea unguium: Secondary | ICD-10-CM

## 2022-08-15 MED ORDER — CICLOPIROX 8 % EX SOLN: Freq: Every day | CUTANEOUS | 0 refills | Status: DC

## 2022-08-15 NOTE — Therapy (Signed)
OUTPATIENT PHYSICAL THERAPY TREATMENT   Patient Name: James Hood MRN: [id number] DOB:[birth year], 82 y.o., male Today's Date: 08/15/2022  END OF SESSION:  PT End of Session - 08/15/22 0836     Visit Number 9    Number of Visits 17    Date for PT Re-Evaluation 09/04/22    Authorization Type [employer]    Authorization Time Period 07/10/22-09/04/22    Authorization - Visit Number 9    Authorization - Number of Visits 10    Progress Note Due on Visit 10    PT Start Time 0833    PT Stop Time 0911    PT Time Calculation (min) 38 min    Activity Tolerance Patient tolerated treatment well;No increased pain    Behavior During Therapy WFL for tasks assessed/performed                   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   Colon polyps    Dementia ([place])    Depression    Diabetes mellitus type 2 in nonobese (HCC)    GERD (gastroesophageal reflux disease)    BARRETTS ESOPHAGUS RESOLVED PER PATIENT   Headache    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[place])    History of hiatal hernia    Hypercholesteremia    Hypertension    Hyperthyroidism    Neuropathic pain    Primary localized osteoarthritis of right knee    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   Stomach ulcer    Tremor, essential    Wound infection complicating hardware ([place]) 04/02/2018   Past Surgical History:  Procedure Laterality Date   ANKLE ARTHROSCOPY Left 09/29/2019   Procedure: LEFT ANKLE ARTHROSCOPY, DEBRIDEMENT;  Surgeon: Newt Minion, MD;  Location: [place];  Service: Orthopedics;  Laterality: Left;   BACK SURGERY     COLONOSCOPY WITH PROPOFOL N/A 09/14/2016   Procedure: COLONOSCOPY WITH PROPOFOL;  Surgeon: Manya Silvas, MD;  Location: [facility] ENDOSCOPY;  Service: Endoscopy;  Laterality: N/A;   esophageal  stretch     ESOPHAGOGASTRODUODENOSCOPY (EGD) WITH PROPOFOL N/A 12/13/2020   Procedure: ESOPHAGOGASTRODUODENOSCOPY (EGD) WITH PROPOFOL;  Surgeon: Lesly Rubenstein, MD;  Location: [facility] ENDOSCOPY;  Service: Endoscopy;  Laterality: N/A;  IDDM   JOINT REPLACEMENT Right 2016   knee   LUMBAR LAMINECTOMY/DECOMPRESSION MICRODISCECTOMY Left 02/03/2016   Procedure: LEFT L5-S1 DISKECTOMY;  Surgeon: Leeroy Cha, MD;  Location: [place] NEURO ORS;  Service: Neurosurgery;  Laterality: Left;  LEFT L5-S1 DISKECTOMY   PULSE GENERATOR IMPLANT N/A 08/21/2017   Procedure: UNILATERAL PULSE GENERATOR IMPLANT;  Surgeon: Meade Maw, MD;  Location: [facility] ORS;  Service: Neurosurgery;  Laterality: N/A;   PULSE GENERATOR IMPLANT Right 04/02/2018   Procedure: REMOVAL OF PULSE GENERATOR IMPLANT AND LEADS;  Surgeon: Meade Maw, MD;  Location: [facility] ORS;  Service: Neurosurgery;  Laterality: Right;   TONSILLECTOMY     TOTAL KNEE ARTHROPLASTY Right 11/15/2014   Procedure: TOTAL KNEE ARTHROPLASTY;  Surgeon: Elsie Saas, MD;  Location: [place];  Service: Orthopedics;  Laterality: Right;   Patient Active Problem List   Diagnosis Date Noted   Peroneal tendinitis, right 07/20/2022   Numbness and tingling in left hand 07/11/2022   Hypoxia 06/27/2022   Hypersomnia 05/24/2022   Chronic cough 11/10/2021   Pressure injury of buttock,  stage 1 08/08/2021   RLS (restless legs syndrome) 06/26/2021   Bilateral leg pain 06/26/2021   Primary osteoarthritis of left shoulder 01/25/2021   Chronic, continuous use of opioids 11/15/2020   Therapeutic opioid-induced constipation (OIC) 11/15/2020   Fatigue 10/11/2020   OAB (overactive bladder) 08/08/2020   Prolonged Q-T interval on ECG 06/08/2020   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   Osteochondral defect of ankle    Chronic pain of left ankle 02/27/2019   Vitamin D deficiency 02/18/2019   Chronic left shoulder pain 02/02/2019   Neuropathy 09/24/2018   Lumbosacral radiculopathy 01/14/2018    Primary osteoarthritis of left ankle 01/14/2018   Lumbar facet joint syndrome 01/14/2018   Chronic pain syndrome 08/26/2017   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   Thyroid nodule 02/27/2017   Fatty liver 02/27/2017   Right hip pain 08/16/2016   Lumbar herniated disc 02/03/2016   Anemia 12/09/2015   Falls 09/16/2015   Barrett's esophagus 09/16/2015   Encounter for general adult medical examination with abnormal findings 09/16/2015   Benign prostatic hyperplasia 09/16/2015   Trochanteric bursitis of left hip 09/09/2015   Difficulty in walking 06/01/2015   HLD (hyperlipidemia) 02/23/2015   DJD (degenerative joint disease) of knee 11/15/2014   Hypertension    Tremor, essential    Primary localized osteoarthritis of right knee    Anxiety and depression    Tobacco abuse 11/18/2013   Chronic diarrhea 11/12/2013   Benign neoplasm of colon 08/25/2013   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 Has not completed HEP exercises. "I think I just need to get the ankle operation".    PERTINENT HISTORY: Pt is a 82 y.o. male referred to OPPT for L ankle pain. Pt reports seeing Dr. Zigmund Daniel planning for L ankle injections trying to avoid surgical intervention. Has yet to have any steroid injections, planning for one this upcoming Wednesday, 07/18/22. Pt's pain described as achey, radiates up to his knee  along the lateral portion. Worst pain described upwards to 7-8/10 NPS. Best pain 7-8/10 NPS. Currently 5/10 NPS. Typically pain more mild in the mornings as he has not walked a lot. Pain is located at lateral malleolus, slightly inferior and lateral. Pain is worsened with standing and walking. Can tolerate about 5 minutes or less of both before onset of significant pain. 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  PAIN:  Are you having pain? Yes: NPRS scale: 5/10 Pain location: L ankle  Pain description: dull, achey Aggravating factors: standing/walker Relieving factors: N/A, maybe rest  PRECAUTIONS: None  WEIGHT BEARING RESTRICTIONS: No  FALLS:  Has patient fallen in last 6 months? Yes. Number of falls 1, tripped on steps going up    PATIENT GOALS: Wants to strengthen L ankle, improve pain. Be able to stand and walk for longer periods of time.   NEXT MD VISIT: 07/18/22  OBJECTIVE:    TODAY'S TREATMENT:                                                                                                                              DATE: 08/15/22  Therex:  - Nustep seat 11 UE 12 L3 61mins for gentle L ankle mobility and strengthening; SPM 85-90 throughout - Standing DF <> PF on rocker board x12 -Standing rocker board side <> side (sup <>pronation) x12 - Standing DF <> PF on rocker board x12 -Standing rocker board side <> side (sup <>pronation) x12  L SLS with R toe on foam ball toss/catch x12; L semi tandem x12 with ball toss slightly outside BOS with more difficulty with LUE reach   Standing toe raise x12 Ankle ABCs RLE only - most difficulty with inversion motion of letters Heel raise from step with eccentric lower  - Seated gastroc stretch with strap 30secH - Seated GT stretch 30secH    PATIENT EDUCATION:  Education details: Prognosis, POC Person educated: Patient Education method:  Explanation Education comprehension: verbalized understanding  HOME EXERCISE PROGRAM: - Standing Gastroc Stretch (2-3x/day; 7x/week) with 30 sec holds - Standing Soleus Stretch (2-3x/day; 7x/week) with 30 sec holds - Seated Heel Slide (2-3x/day; 7x/week) with 3 sec holds 12-20 reps - SLB (1x/day; 1x/week) with 30 sec hold  ASSESSMENT:  CLINICAL IMPRESSION: PT continued therex progression for increased ankle ROM and lower leg stabilization and motor control. Pt is able to comply with all cuing for proper technique of therex with excellent motivation throughout session. Pt reporting continued thoughts on surgery, PT educated patient that HEP compliance would aid in optimal surgical outcomes as well with understanding. Pt would benefit from skilled PT to continue decrease pain and increase gross cervical and shoulder impairments to return to PLOF and increase QoL.  OBJECTIVE IMPAIRMENTS: Abnormal gait, decreased activity tolerance, decreased balance, decreased mobility, difficulty walking, decreased ROM, decreased strength, hypomobility, impaired flexibility, improper body mechanics, postural dysfunction, and pain.   ACTIVITY LIMITATIONS: carrying, sitting, standing, squatting, stairs, and locomotion level  PARTICIPATION LIMITATIONS: shopping, community activity, and occupation  PERSONAL FACTORS: Age, Past/current experiences, Time since onset of injury/illness/exacerbation, and 3+ comorbidities: Anxiety/depression, dementia, DM type 2, HTN, R knee OA/ TKA, CPS, B foot neuropathy  are also affecting patient's functional outcome.   REHAB POTENTIAL: Fair Chronicity of symptoms, age  CLINICAL DECISION MAKING: Evolving/moderate complexity  EVALUATION COMPLEXITY: Moderate   GOALS: Goals reviewed with patient? No  SHORT TERM GOALS: Target date: 08/07/22 Pt will be independent with HEP to improve R knee, L ankle impairments to improve gait mechanics for improved tolerance for community  distances.  Baseline:07/10/22: Deferred to next session Goal status: INITIAL  LONG TERM GOALS: Target date: 09/04/22  Pt will improve FOTO to target score or greater to demonstrate clinically significant improvement in functional mobility.  Baseline: 07/10/22: 42 with target of 52 Goal status: INITIAL  2.  Pt will improve 2 MWT to norms  for community dwelling older adults with </= 5/10 pain in L ankle with gait to demonstrate clinically significant improvement to complete community distances with less pain. Baseline: 07/10/22: 256' with 7-8/10 ankle pain. Norm for 2 MWT is 496'. Goal status: INITIAL  3.  Pt will improve L ankle DF AROM by at least 5 degrees to improve L ankle mobility for improved swing phase in gait for falls risk reduction and improved L ankle pain in L ankle OA.  Baseline: 07/10/22: 10 degrees Goal status: INITIAL  4.  Pt will improve R knee AROM TKE by at least 5 degrees to assist in normalized gait mechanics to assist in offloading L ankle in weightbearing positions to decrease L ankle pain Baseline: 07/10/22: Rigid gait, Increased weight shift onto LLE in stance phase with L lateral lean due to R knee extension limitations. Lacking 11 degrees R knee from TKE. Goal status: INITIAL  PLAN:  PT FREQUENCY: 2x/week  PT DURATION: 8 weeks  PLANNED INTERVENTIONS: Therapeutic exercises, Therapeutic activity, Neuromuscular re-education, Balance training, Gait training, Patient/Family education, Joint mobilization, Aquatic Therapy, Dry Needling, Cryotherapy, Moist heat, Manual therapy, and Re-evaluation  PLAN FOR NEXT SESSION: continue POC     08/15/2022, 10:43 AM

## 2022-08-15 NOTE — Patient Instructions (Signed)
[facility] at [facility]: Dr Para March, Dr Debby Bud, and Dr Clair Gulling

## 2022-08-16 ENCOUNTER — Encounter: Payer: Self-pay | Admitting: Family Medicine

## 2022-08-16 ENCOUNTER — Ambulatory Visit (INDEPENDENT_AMBULATORY_CARE_PROVIDER_SITE_OTHER): Payer: PPO | Admitting: Family Medicine

## 2022-08-16 VITALS — BP 130/70 | HR 75

## 2022-08-16 DIAGNOSIS — M7672 Peroneal tendinitis, left leg: Secondary | ICD-10-CM | POA: Diagnosis not present

## 2022-08-16 DIAGNOSIS — M19072 Primary osteoarthritis, left ankle and foot: Secondary | ICD-10-CM | POA: Diagnosis not present

## 2022-08-16 MED ORDER — LIDOCAINE 5 % EX PTCH: 1.0000 | MEDICATED_PATCH | Freq: Two times a day (BID) | CUTANEOUS | 2 refills | Status: DC

## 2022-08-16 NOTE — Progress Notes (Signed)
  Subjective:  Patient ID: James Hood, male    DOB: 1940/11/26,  MRN: [id number]  Chief Complaint  Patient presents with   Arthritis    left foot pain-near the top of foot    82 y.o. male presents with the above complaint. History confirmed with patient.  He is getting regular ultrasound-guided injections for the cc arthritis which is helping.  Getting less helpful each time he has 1.  The left foot now has a big spur on the top of the midfoot that is causing pain and pressure with shoes on  Objective:  Physical Exam: warm, good capillary refill, no trophic changes or ulcerative lesions, normal DP and PT pulses, normal sensory exam, and palpable bony spur first TMT J, slight tenderness here   Radiographs: Multiple views x-ray of the left foot: Notable dorsal spur first TMT Assessment:   1. Onychomycosis   2. Bone spur of left foot   3. Neuritis of left foot      Plan:  Patient was evaluated and treated and all questions answered.  Reviewed his radiographs.  Discussed the presence of the bony spur how this relates to possible arthritis in the joint.  Discussed alternate lacing patterns of the spur and shoe gear does not compress the dorsal cutaneous nerve.  Recommended Voltaren gel as needed.  Return to see me as needed if this does not work, could consider resection of the spur as needed.  No follow-ups on file.

## 2022-08-16 NOTE — Assessment & Plan Note (Addendum)
Chronic condition with ongoing symptomatology, received intra-articular corticosteroid injection on 08/05/2022, provided a few weeks of response with waning symptoms reported.  He continues to attend physical therapy with steady improvement, though with ongoing pain symptomatology.  At this stage, given treatments to date, I have advised the following: - Referral placed for further evaluation for management options by foot and ankle orthopedic surgeon - Rx Lidoderm patch - Can utilize OTC lidocaine 4% patch if Rx unobtainable - Follow-up with our group on an as-needed basis.

## 2022-08-17 NOTE — Patient Instructions (Signed)
-   Referral coordinator will contact you to schedule visit with foot and ankle surgeon - Can trial Rx lidocaine patch 5% strength - If unable to obtain Rx, utilize over-the-counter lidocaine patch 4% as needed - Contact us for questions and follow-up as needed

## 2022-08-17 NOTE — Progress Notes (Signed)
     Primary Care / Sports Medicine Office Visit  Patient Information:  Patient ID: James Hood, male DOB: 03-May-1941 Age: 82 y.o. MRN: [id number]   James Hood is a pleasant 82 y.o. male presenting with the following:  Chief Complaint  Patient presents with   Ankle Pain    Left     Vitals:   08/16/22 1515  BP: 130/70  Pulse: 75  SpO2: 94%   There were no vitals filed for this visit. There is no height or weight on file to calculate BMI.     Independent interpretation of notes and tests performed by another provider:   None  Procedures performed:   None  Pertinent History, Exam, Impression, and Recommendations:   James Hood was seen today for ankle pain.  Primary osteoarthritis of left ankle Assessment & Plan: Chronic condition with ongoing symptomatology, received intra-articular corticosteroid injection on 08/05/2022, provided a few weeks of response with waning symptoms reported.  He continues to attend physical therapy with steady improvement, though with ongoing pain symptomatology.  At this stage, given treatments to date, I have encouraged further evaluation for management options by foot and ankle orthopedic surgeon.  A referral was placed in this regard today for specific recommendation on surgeon.  He can follow-up with our group on an as-needed basis.  Orders: -     Ambulatory referral to Orthopedic Surgery  Peroneal tendonitis, left -     Ambulatory referral to Orthopedic Surgery  Other orders -     Lidocaine; Place 1 patch onto the skin every 12 (twelve) hours. Remove & Discard patch within 12 hours or as directed by MD  Dispense: 30 patch; Refill: 2     Orders & Medications Meds ordered this encounter  Medications   lidocaine (LIDODERM) 5 %    Sig: Place 1 patch onto the skin every 12 (twelve) hours. Remove & Discard patch within 12 hours or as directed by MD    Dispense:  30 patch    Refill:  2   Orders Placed This Encounter   Procedures   Ambulatory referral to Orthopedic Surgery     No follow-ups on file.     Montel Culver, MD, [facility]   Primary Care Sports Medicine Primary Care and Sports Medicine at [facility]

## 2022-08-20 ENCOUNTER — Encounter: Payer: Self-pay | Admitting: Physical Therapy

## 2022-08-20 ENCOUNTER — Ambulatory Visit: Payer: PPO | Attending: Family Medicine | Admitting: Physical Therapy

## 2022-08-20 DIAGNOSIS — M25572 Pain in left ankle and joints of left foot: Secondary | ICD-10-CM | POA: Diagnosis not present

## 2022-08-20 NOTE — Therapy (Signed)
OUTPATIENT PHYSICAL THERAPY TREATMENT/DC Summary  Reporting Period 07/10/22 - 08/20/22   Patient Name: James Hood MRN: [id number] DOB:02-01-1941, 82 y.o., male Today's Date: 08/20/2022  END OF SESSION:  PT End of Session - 08/20/22 0835     Visit Number 10    Number of Visits 17    Date for PT Re-Evaluation 09/04/22    Authorization Type [employer]    Authorization Time Period 07/10/22-09/04/22    Authorization - Visit Number 10    Authorization - Number of Visits 10    Progress Note Due on Visit 10    PT Start Time 0830    PT Stop Time 0904    PT Time Calculation (min) 34 min    Equipment Utilized During Treatment Gait belt    Activity Tolerance Patient tolerated treatment well;No increased pain    Behavior During Therapy WFL for tasks assessed/performed                    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   Colon polyps    Dementia    Depression    Diabetes mellitus type 2 in nonobese    GERD (gastroesophageal reflux disease)    BARRETTS ESOPHAGUS RESOLVED PER PATIENT   Headache    History of alcoholism    History of hiatal hernia    Hypercholesteremia    Hypertension    Hyperthyroidism    Neuropathic pain    Primary localized osteoarthritis of right knee    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   Stomach ulcer    Tremor, essential    Wound infection complicating hardware 0000000   Past Surgical History:  Procedure Laterality Date   ANKLE ARTHROSCOPY Left 09/29/2019   Procedure: LEFT ANKLE ARTHROSCOPY, DEBRIDEMENT;  Surgeon: Newt Minion, MD;  Location: [place];  Service: Orthopedics;  Laterality: Left;   BACK SURGERY     COLONOSCOPY WITH PROPOFOL N/A 09/14/2016   Procedure: COLONOSCOPY WITH PROPOFOL;  Surgeon: Manya Silvas, MD;   Location: [facility] ENDOSCOPY;  Service: Endoscopy;  Laterality: N/A;   esophageal stretch     ESOPHAGOGASTRODUODENOSCOPY (EGD) WITH PROPOFOL N/A 12/13/2020   Procedure: ESOPHAGOGASTRODUODENOSCOPY (EGD) WITH PROPOFOL;  Surgeon: Lesly Rubenstein, MD;  Location: [facility] ENDOSCOPY;  Service: Endoscopy;  Laterality: N/A;  IDDM   JOINT REPLACEMENT Right 2016   knee   LUMBAR LAMINECTOMY/DECOMPRESSION MICRODISCECTOMY Left 02/03/2016   Procedure: LEFT L5-S1 DISKECTOMY;  Surgeon: Leeroy Cha, MD;  Location: [place] [facility]URO [facility]S;  Service: Neurosurgery;  Laterality: Left;  LEFT L5-S1 DISKECTOMY   PULSE GENERATOR IMPLANT N/A 08/21/2017   Procedure: UNILATERAL PULSE GENERATOR IMPLANT;  Surgeon: Meade Maw, MD;  Location: [facility] ORS;  Service: Neurosurgery;  Laterality: N/A;   PULSE GENERATOR IMPLANT Right 04/02/2018   Procedure: REMOVAL OF PULSE GENERATOR IMPLANT AND LEADS;  Surgeon: Meade Maw, MD;  Location: [facility] ORS;  Service: Neurosurgery;  Laterality: Right;   TONSILLECTOMY     TOTAL KNEE ARTHROPLASTY Right 11/15/2014   Procedure: TOTAL KNEE ARTHROPLASTY;  Surgeon: Elsie Saas, MD;  Location: [place];  Service: Orthopedics;  Laterality: Right;   Patient Active Problem List   Diagnosis Date Noted   Peroneal tendonitis, left 08/16/2022   Peroneal tendinitis, right 07/20/2022   Numbness and tingling in left hand 07/11/2022  Hypoxia 06/27/2022   Hypersomnia 05/24/2022   Chronic cough 11/10/2021   Pressure injury of buttock, stage 1 08/08/2021   RLS (restless legs syndrome) 06/26/2021   Bilateral leg pain 06/26/2021   Primary osteoarthritis of left shoulder 01/25/2021   Chronic, continuous use of opioids 11/15/2020   Therapeutic opioid-induced constipation (OIC) 11/15/2020   Fatigue 10/11/2020   OAB (overactive bladder) 08/08/2020   Prolonged Q-T interval on ECG 06/08/2020   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   Osteochondral defect of ankle    Chronic pain of left ankle 02/27/2019   Vitamin D  deficiency 02/18/2019   Chronic left shoulder pain 02/02/2019   Neuropathy 09/24/2018   Lumbosacral radiculopathy 01/14/2018   Primary osteoarthritis of left ankle 01/14/2018   Lumbar facet joint syndrome 01/14/2018   Chronic pain syndrome 08/26/2017   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   Thyroid nodule 02/27/2017   Fatty liver 02/27/2017   Right hip pain 08/16/2016   Lumbar herniated disc 02/03/2016   Anemia 12/09/2015   Falls 09/16/2015   Barrett's esophagus 09/16/2015   Encounter for general adult medical examination with abnormal findings 09/16/2015   Benign prostatic hyperplasia 09/16/2015   Trochanteric bursitis of left hip 09/09/2015   Difficulty in walking 06/01/2015   HLD (hyperlipidemia) 02/23/2015   DJD (degenerative joint disease) of knee 11/15/2014   Hypertension    Tremor, essential    Primary localized osteoarthritis of right knee    Anxiety and depression    Tobacco abuse 11/18/2013   Chronic diarrhea 11/12/2013   Benign neoplasm of colon 08/25/2013   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 Reports he has done ABCs with his ankle and "stood on my tip toes for a couple minutes" 4x since last session. Saw MD following last session, suggesting surgery.    PERTINENT HISTORY: Pt is a 82 y.o. male referred to OPPT for L ankle pain. Pt reports seeing Dr. Zigmund Daniel planning for L ankle injections trying to avoid surgical intervention. Has yet  to have any steroid injections, planning for one this upcoming Wednesday, 07/18/22. Pt's pain described as achey, radiates up to his knee along the lateral portion. Worst pain described upwards to 7-8/10 NPS. Best pain 7-8/10 NPS. Currently 5/10 NPS. Typically pain more mild in the mornings as he has not walked a lot. Pain is located at lateral malleolus, slightly inferior and lateral. Pain is worsened with standing and walking. Can tolerate about 5 minutes or less of both before onset of significant pain. 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  PAIN:  Are you having pain? Yes: NPRS scale: 5/10 Pain location: L ankle  Pain description: dull, achey Aggravating factors: standing/walker Relieving factors: N/A, maybe rest  PRECAUTIONS: None  WEIGHT BEARING RESTRICTIONS: No  FALLS:  Has patient fallen in last 6 months? Yes. Number of falls 1, tripped on steps going up    PATIENT GOALS: Wants to strengthen L ankle, improve  pain. Be able to stand and walk for longer periods of time.   NEXT MD VISIT: 07/18/22   OBJECTIVE:    TODAY'S TREATMENT:                                                                                                                              DATE: 08/20/22  Therex:  - Nustep seat 11 UE 12 L3 9mins for gentle L ankle mobility and strengthening; SPM 85-90 throughout AROM assessment of L DF and R knee TKE in standing with education on mobility implications in gait with understanding  Access Code: [email or web address] - Seated Ankle Alphabet  - 1-2 x daily - 7 x weekly - Seated Hamstring Stretch  - 1-2 x daily - 7 x weekly - 30-60sec hold - Standing Gastroc Stretch  - 1-2 x daily - 7 x weekly - 30sec hold - Seated Self Great Toe Stretch  - 1-2 x daily - 7 x weekly - 30-60sec hold - Standing Heel Raises  - 1 x daily - 3-4 x weekly - 2-3 sets - 12-20 reps - Standing Ankle Dorsiflexion with  Table Support  - 1 x daily - 3-4 x weekly - 2-3 sets - 12-20 reps - Squat with chair touch  - 1 x daily - 3-4 x weekly - 2-3 sets - 8-12reps - Standing Single Leg Stance with Counter Support  - 2 x daily - 3-4 x weekly - 30-60sec hold     PATIENT EDUCATION:  Education details: Prognosis, POC Person educated: Patient Education method: Explanation Education comprehension: verbalized understanding  HOME EXERCISE PROGRAM: - Psychologist, counselling (2-3x/day; 7x/week) with 30 sec holds - Standing Soleus Stretch (2-3x/day; 7x/week) with 30 sec holds - Seated Heel Slide (2-3x/day; 7x/week) with 3 sec holds 12-20 reps - SLB (1x/day; 1x/week) with 30 sec hold  ASSESSMENT:  CLINICAL IMPRESSION: Pt preparing for discharge due to seeking surgical interventions with MD for remaining ankle pain. PT reassessed goals this session where patient has met most goals to safely d/c formal Pt to robust HEP. Patient is able to demonstrate and verbalize understanding of all HEP recommendations with minimal corrections needed. Pt given clinic contact info should further questions or concerns arise. Pt to d/c PT.    OBJECTIVE IMPAIRMENTS: Abnormal gait, decreased activity tolerance, decreased balance, decreased mobility, difficulty walking, decreased ROM, decreased strength, hypomobility, impaired flexibility, improper body mechanics, postural dysfunction, and pain.   ACTIVITY LIMITATIONS: carrying, sitting, standing, squatting, stairs, and locomotion level  PARTICIPATION LIMITATIONS: shopping, community activity, and occupation  PERSONAL FACTORS: Age, Past/current experiences, Time since onset of injury/illness/exacerbation, and 3+ comorbidities: Anxiety/depression, dementia, DM type 2, HTN, R knee OA/ TKA, CPS, B foot neuropathy  are also affecting patient's functional outcome.   REHAB POTENTIAL: Fair Chronicity of symptoms, age  CLINICAL DECISION MAKING: Evolving/moderate complexity  EVALUATION  COMPLEXITY: Moderate   GOALS: Goals reviewed with patient? No  SHORT TERM GOALS: Target date: 08/07/22 Pt will be  independent with HEP to improve R knee, L ankle impairments to improve gait mechanics for improved tolerance for community distances.  Baseline:07/10/22: Deferred to next session Goal status: INITIAL  LONG TERM GOALS: Target date: 09/04/22  Pt will improve FOTO to target score or greater to demonstrate clinically significant improvement in functional mobility.  Baseline: 07/10/22: 42 with target of 52; 08/20/22 50 Goal status: NOT MET  2.  Pt will improve 2 MWT to norms for community dwelling older adults with </= 5/10 pain in L ankle with gait to demonstrate clinically significant improvement to complete community distances with less pain. Baseline: 07/10/22: 256' with 7-8/10 ankle pain. Norm for 2 MWT is 396' ; 08/20/22 390' with 4/10 Goal status: MET  3.  Pt will improve L ankle DF AROM by at least 5 degrees to improve L ankle mobility for improved swing phase in gait for falls risk reduction and improved L ankle pain in L ankle OA.  Baseline: 07/10/22: 10 degrees; 08/20/22 16d Goal status: MET  4.  Pt will improve R knee AROM TKE by at least 5 degrees to assist in normalized gait mechanics to assist in offloading L ankle in weightbearing positions to decrease L ankle pain Baseline: 07/10/22: Rigid gait, Increased weight shift onto LLE in stance phase with L lateral lean due to R knee extension limitations. Lacking 11 degrees R knee from TKE; 08/20/22 Continuing to lack 10d of TKE R knee Goal status: NOT MET  PLAN:  PT FREQUENCY: 2x/week  PT DURATION: 8 weeks  PLANNED INTERVENTIONS: Therapeutic exercises, Therapeutic activity, Neuromuscular re-education, Balance training, Gait training, Patient/Family education, Joint mobilization, Aquatic Therapy, Dry Needling, Cryotherapy, Moist heat, Manual therapy, and Re-evaluation  PLAN FOR NEXT SESSION: continue POC     08/20/2022, 9:52  AM

## 2022-08-22 ENCOUNTER — Ambulatory Visit: Payer: PPO | Admitting: Physical Therapy

## 2022-08-27 ENCOUNTER — Encounter: Payer: Self-pay | Admitting: Family Medicine

## 2022-08-27 ENCOUNTER — Ambulatory Visit (INDEPENDENT_AMBULATORY_CARE_PROVIDER_SITE_OTHER): Payer: PPO | Admitting: Family Medicine

## 2022-08-27 VITALS — BP 104/74 | HR 74 | Temp 98.2°F | Ht 70.0 in | Wt 168.4 lb

## 2022-08-27 DIAGNOSIS — H9313 Tinnitus, bilateral: Secondary | ICD-10-CM | POA: Diagnosis not present

## 2022-08-27 DIAGNOSIS — Z0001 Encounter for general adult medical examination with abnormal findings: Secondary | ICD-10-CM

## 2022-08-27 DIAGNOSIS — I10 Essential (primary) hypertension: Secondary | ICD-10-CM | POA: Diagnosis not present

## 2022-08-27 DIAGNOSIS — E785 Hyperlipidemia, unspecified: Secondary | ICD-10-CM | POA: Diagnosis not present

## 2022-08-27 DIAGNOSIS — H9319 Tinnitus, unspecified ear: Secondary | ICD-10-CM | POA: Insufficient documentation

## 2022-08-27 DIAGNOSIS — M19072 Primary osteoarthritis, left ankle and foot: Secondary | ICD-10-CM | POA: Diagnosis not present

## 2022-08-27 DIAGNOSIS — E1142 Type 2 diabetes mellitus with diabetic polyneuropathy: Secondary | ICD-10-CM | POA: Diagnosis not present

## 2022-08-27 LAB — COMPREHENSIVE METABOLIC PANEL
ALT: 18 U/L (ref 0–53)
AST: 23 U/L (ref 0–37)
Albumin: 4.1 g/dL (ref 3.5–5.2)
Alkaline Phosphatase: 51 U/L (ref 39–117)
BUN: 27 mg/dL — ABNORMAL HIGH (ref 6–23)
CO2: 28 mEq/L (ref 19–32)
Calcium: 9.3 mg/dL (ref 8.4–10.5)
Chloride: 102 mEq/L (ref 96–112)
Creatinine, Ser: 1.45 mg/dL (ref 0.40–1.50)
GFR: 44.86 mL/min — ABNORMAL LOW (ref >60.00)
Glucose, Bld: 109 mg/dL — ABNORMAL HIGH (ref 70–99)
Potassium: 4.8 mEq/L (ref 3.5–5.1)
Sodium: 138 mEq/L (ref 135–145)
Total Bilirubin: 0.2 mg/dL (ref 0.2–1.2)
Total Protein: 6.1 g/dL (ref 6.0–8.3)

## 2022-08-27 LAB — LIPID PANEL
Cholesterol: 119 mg/dL (ref 0–200)
HDL: 65.7 mg/dL (ref >39.00)
LDL Cholesterol: 37 mg/dL (ref 0–99)
NonHDL: 53.2
Total CHOL/HDL Ratio: 2
Triglycerides: 83 mg/dL (ref 0.0–149.0)
VLDL: 16.6 mg/dL (ref 0.0–40.0)

## 2022-08-27 LAB — HEMOGLOBIN A1C: Hgb A1c MFr Bld: 6.9 % — ABNORMAL HIGH (ref 4.6–6.5)

## 2022-08-27 NOTE — Assessment & Plan Note (Signed)
Chronic condition.  Patient will meet with his surgeon next month as scheduled.

## 2022-08-27 NOTE — Assessment & Plan Note (Signed)
Physical exam completed.  Encouraged healthy diet.  Encouraged continuing with physical therapy exercises and try to exercise once he gets his ankle pain taken care of.  We will contact his pharmacy to confirm the date of his RSV vaccination.  He declines further COVID vaccinations.  He understands the purpose of the COVID vaccinations.  I encouraged smoking cessation though he declines at this time.  Lab work as outlined.

## 2022-08-27 NOTE — Patient Instructions (Signed)
Nice to see you. Please consider quitting smoking. Please try to limit your microwave meals. Please try to continue to do your physical therapy exercises.

## 2022-08-27 NOTE — Progress Notes (Signed)
James Alar, MD Phone: [phone]  James Hood is a 82 y.o. male who presents today for CPE.  Diet: ok, eats breakfast out, does eat lots of microwave meals, eats sandwiches Exercise: not able to with chronic ankle pain Colonoscopy: not indicated with age Prostate cancer screening: not indicated with age Family history-  Prostate cancer: no  Colon cancer: no Vaccines-   Flu: UTD  Tetanus: UTD  Shingles: UTD  COVID19: x4  Pneumonia: UTD  RSV: UTD Hep C Screening: UTD Tobacco use: 3/4 PPD, not ready to quit Alcohol use: no Illicit Drug use: no Dentist: yes Ophthalmology: yes  Chronic ankle pain: Patient notes he saw sports medicine and they did a couple of cortisone injections which lasted 2 to 3 weeks.  Now they have referred him to a surgeon and he sees them next month.  He is trying to do some PT exercises at home.   Active Ambulatory Problems    Diagnosis Date Noted   Hypertension    Tremor, essential    Primary localized osteoarthritis of right knee    Anxiety and depression    DJD (degenerative joint disease) of knee 11/15/2014   Trochanteric bursitis of left hip 09/09/2015   Falls 09/16/2015   Barrett's esophagus 09/16/2015   Encounter for general adult medical examination with abnormal findings 09/16/2015   Benign prostatic hyperplasia 09/16/2015   Benign neoplasm of colon 08/25/2013   Chronic diarrhea 11/12/2013   Type 2 diabetes mellitus 10/06/2012   Difficulty in walking 06/01/2015   HLD (hyperlipidemia) 02/23/2015   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   Tobacco abuse 11/18/2013   Anemia 12/09/2015   Lumbar herniated disc 02/03/2016   Right hip pain 08/16/2016   Thyroid nodule 02/27/2017   Fatty liver 02/27/2017   Chronic pain syndrome 08/26/2017   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   Lumbosacral radiculopathy 01/14/2018   Primary osteoarthritis of left ankle 01/14/2018   Lumbar facet joint syndrome 01/14/2018   Neuropathy  09/24/2018   Chronic left shoulder pain 02/02/2019   Vitamin D deficiency 02/18/2019   Chronic pain of left ankle 02/27/2019   Osteochondral defect of ankle    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   Prolonged Q-T interval on ECG 06/08/2020   OAB (overactive bladder) 08/08/2020   Fatigue 10/11/2020   Chronic, continuous use of opioids 11/15/2020   Therapeutic opioid-induced constipation (OIC) 11/15/2020   Primary osteoarthritis of left shoulder 01/25/2021   RLS (restless legs syndrome) 06/26/2021   Bilateral leg pain 06/26/2021   Pressure injury of buttock, stage 1 08/08/2021   Chronic cough 11/10/2021   Hypersomnia 05/24/2022   Hypoxia 06/27/2022   Numbness and tingling in left hand 07/11/2022   Peroneal tendinitis, right 07/20/2022   Peroneal tendonitis, left 08/16/2022   Tinnitus 08/27/2022   Resolved Ambulatory Problems    Diagnosis Date Noted   Diabetes mellitus type 2 in nonobese [facility])    Headache 09/09/2015   Chronic lumbar pain 09/16/2015   Benign essential tremor 03/10/2015   Controlled type 2 diabetes mellitus without complication 06/01/2015   Clinical depression 01/06/2014   Static tremor 10/06/2012   Facial droop 05/03/2015   Amnesia 06/01/2015   Absence of sensation 11/18/2013   Episode of syncope 11/23/2014   Has a tremor 10/02/2015   Hypertriglyceridemia 12/09/2015   Sinusitis, acute maxillary 05/30/2016   Tobacco abuse counseling 05/30/2016   Night sweats 06/19/2016   Flu 08/09/2016   Hyponatremia 08/16/2016   Diarrhea 08/27/2016   Purpura 01/29/2017   S/P insertion of spinal  cord stimulator 08/26/2017   Upper respiratory infection 03/19/2018   Wound infection complicating hardware 04/02/2018   Weight loss 07/23/2019   Sleeping difficulty 08/24/2019   Hiccups 08/24/2019   Right leg pain 02/05/2020   CAP (community acquired pneumonia) 05/30/2020   Severe sepsis 06/01/2020   Acute metabolic encephalopathy 06/01/2020   AKI (acute kidney injury) 08/08/2020    Leg cramps 08/08/2020   Rhinorrhea 01/11/2021   Skin lesions 01/11/2021   Past Medical History:  Diagnosis Date   Anxiety    Arthritis    BPH (benign prostatic hyperplasia)    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/2017   Colon polyps    Dementia    Depression    GERD (gastroesophageal reflux disease)    History of alcoholism    History of hiatal hernia    Hypercholesteremia    Hyperthyroidism    Neuropathic pain    Stomach ulcer     Family History  Problem Relation Age of Onset   Heart attack Mother    Heart disease Mother    Hypertension Father    Depression Father    Kidney cancer Neg Hx    Prostate cancer Neg Hx     Social History   Socioeconomic History   Marital status: Married    Spouse name: Not on file   Number of children: Not on file   Years of education: Not on file   Highest education level: Not on file  Occupational History   Not on file  Tobacco Use   Smoking status: Every Day    Packs/day: 0.50    Years: 60.00    Additional pack years: 0.00    Total pack years: 30.00    Types: Cigarettes   Smokeless tobacco: Never  Vaping Use   Vaping Use: Former  Substance and Sexual Activity   Alcohol use: Not Currently    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   Drug use: Never   Sexual activity: Not Currently  Other Topics Concern   Not on file  Social History Narrative   Not on file   Social Determinants of Health   Financial Resource Strain: Low Risk  (07/18/2022)   Overall Financial Resource Strain (CARDIA)    Difficulty of Paying Living Expenses: Not hard at all  Food Insecurity: No Food Insecurity (07/18/2022)   Hunger Vital Sign    Worried About Running Out of Food in the Last Year: Never true    Ran Out of Food in the Last Year: Never true  Transportation Needs: No Transportation Needs (07/18/2022)   PRAPARE - Transportation    Lack of Transportation (Medical): No    Lack of Transportation (Non-Medical): No   Physical Activity: Insufficiently Active (12/30/2019)   Exercise Vital Sign    Days of Exercise per Week: 3 days    Minutes of Exercise per Session: 40 min  Stress: No Stress Concern Present (12/29/2021)   [employer] of Occupational Health - Occupational Stress Questionnaire    Feeling of Stress : Not at all  Social Connections: Unknown (12/29/2021)   Social Connection and Isolation Panel [NHANES]    Frequency of Communication with Friends and Family: Not on file    Frequency of Social Gatherings with Friends and Family: Not on file    Attends Religious Services: Not on file    Active Member of Clubs or Organizations: Not on file    Attends Banker Meetings: Not on file    Marital  Status: Married  Catering managerntimate Partner Violence: Not At Risk (07/18/2022)   Humiliation, Afraid, Rape, and Kick questionnaire    Fear of Current or Ex-Partner: No    Emotionally Abused: No    Physically Abused: No    Sexually Abused: No    ROS  General:  Negative for nexplained weight loss, fever Skin: Negative for new or changing mole, sore that won't heal HEENT: Negative for trouble hearing, trouble seeing, ringing in ears, mouth sores, hoarseness, change in voice, dysphagia. CV:  Negative for chest pain, dyspnea, edema, palpitations Resp: Negative for cough, dyspnea, hemoptysis GI: Negative for nausea, vomiting, diarrhea, constipation, abdominal pain, melena, hematochezia. GU: Negative for dysuria, incontinence, urinary hesitance, hematuria, vaginal or penile discharge, polyuria, sexual difficulty, lumps in testicle or breasts MSK: Negative for muscle cramps or aches, joint pain or swelling Neuro: Negative for headaches, weakness, numbness, dizziness, passing out/fainting Psych: Negative for depression, anxiety, memory problems  Objective  Physical Exam Vitals:   08/27/22 0829  BP: 104/74  Pulse: 74  Temp: 98.2 F (36.8 C)  SpO2: 96%    BP Readings from Last 3 Encounters:   08/27/22 104/74  08/16/22 130/70  07/18/22 100/62   Wt Readings from Last 3 Encounters:  08/27/22 168 lb 6.4 oz (76.4 kg)  08/03/22 166 lb 9.6 oz (75.6 kg)  07/18/22 166 lb (75.3 kg)    Physical Exam Constitutional:      General: He is not in acute distress.    Appearance: He is not diaphoretic.  HENT:     Head: Normocephalic and atraumatic.  Cardiovascular:     Rate and Rhythm: Normal rate and regular rhythm.     Heart sounds: Normal heart sounds.  Pulmonary:     Effort: Pulmonary effort is normal.     Breath sounds: Normal breath sounds.  Abdominal:     General: Bowel sounds are normal. There is no distension.     Palpations: Abdomen is soft.  Musculoskeletal:     Right lower leg: No edema.     Left lower leg: No edema.  Lymphadenopathy:     Cervical: No cervical adenopathy.  Skin:    General: Skin is warm and dry.  Neurological:     Mental Status: He is alert.  Psychiatric:        Mood and Affect: Mood normal.      Assessment/Plan:   Encounter for general adult medical examination with abnormal findings Assessment & Plan: Physical exam completed.  Encouraged healthy diet.  Encouraged continuing with physical therapy exercises and try to exercise once he gets his ankle pain taken care of.  We will contact his pharmacy to confirm the date of his RSV vaccination.  He declines further COVID vaccinations.  He understands the purpose of the COVID vaccinations.  I encouraged smoking cessation though he declines at this time.  Lab work as outlined.   Primary osteoarthritis of left ankle Assessment & Plan: Chronic condition.  Patient will meet with his surgeon next month as scheduled.   Type 2 diabetes mellitus with diabetic polyneuropathy, without long-term current use of insulin -     Hemoglobin A1c -     Microalbumin / creatinine urine ratio  Primary hypertension -     Comprehensive metabolic panel  Hyperlipidemia, unspecified hyperlipidemia type -      Comprehensive metabolic panel -     Lipid panel  Tinnitus of both ears Assessment & Plan: Chronic issue.  At the end of the visit the patient noted  he was having some issues with tinnitus and wondered if there is anything to be done for this.  He noted that he can hear his pulse with the tinnitus.  Discussed referral to ENT for hearing evaluation and to determine if he needs further workup for possible pulsatile tinnitus.  Orders: -     Ambulatory referral to ENT    Return in about 6 months (around 02/26/2023) for DM.   James Alar, MD [facility]

## 2022-08-27 NOTE — Assessment & Plan Note (Addendum)
Chronic issue.  At the end of the visit the patient noted he was having some issues with tinnitus and wondered if there is anything to be done for this.  He noted that he can hear his pulse with the tinnitus.  Discussed referral to ENT for hearing evaluation and to determine if he needs further workup for possible pulsatile tinnitus.

## 2022-08-27 NOTE — Addendum Note (Signed)
Addended by: Warden Fillers on: 08/27/2022 10:12 AM   Modules accepted: Orders

## 2022-08-28 ENCOUNTER — Ambulatory Visit
Payer: PPO | Attending: Student in an Organized Health Care Education/Training Program | Admitting: Student in an Organized Health Care Education/Training Program

## 2022-08-28 ENCOUNTER — Encounter: Payer: Self-pay | Admitting: Student in an Organized Health Care Education/Training Program

## 2022-08-28 ENCOUNTER — Telehealth: Payer: Self-pay | Admitting: Student in an Organized Health Care Education/Training Program

## 2022-08-28 VITALS — BP 152/73 | HR 58 | Temp 97.3°F | Resp 16 | Ht 70.0 in | Wt 165.0 lb

## 2022-08-28 DIAGNOSIS — M5417 Radiculopathy, lumbosacral region: Secondary | ICD-10-CM

## 2022-08-28 DIAGNOSIS — M958 Other specified acquired deformities of musculoskeletal system: Secondary | ICD-10-CM | POA: Diagnosis not present

## 2022-08-28 DIAGNOSIS — M25572 Pain in left ankle and joints of left foot: Secondary | ICD-10-CM | POA: Diagnosis not present

## 2022-08-28 DIAGNOSIS — G8929 Other chronic pain: Secondary | ICD-10-CM | POA: Insufficient documentation

## 2022-08-28 DIAGNOSIS — G894 Chronic pain syndrome: Secondary | ICD-10-CM | POA: Diagnosis not present

## 2022-08-28 DIAGNOSIS — M47816 Spondylosis without myelopathy or radiculopathy, lumbar region: Secondary | ICD-10-CM | POA: Diagnosis not present

## 2022-08-28 DIAGNOSIS — M19072 Primary osteoarthritis, left ankle and foot: Secondary | ICD-10-CM

## 2022-08-28 LAB — MICROALBUMIN / CREATININE URINE RATIO
Creatinine,U: 32.8 mg/dL
Microalb Creat Ratio: 2.1 mg/g (ref 0.0–30.0)
Microalb, Ur: <0.7 mg/dL (ref 0.0–1.9)

## 2022-08-28 MED ORDER — OXYCODONE-ACETAMINOPHEN 10-325 MG PO TABS: 1.0000 | ORAL_TABLET | Freq: Four times a day (QID) | ORAL | 0 refills | Status: AC | PRN

## 2022-08-28 NOTE — Telephone Encounter (Signed)
Patient's insurance will not cover the Nucynta. They will cover Oxycodone 10mg . Wants to know if Dr Cherylann Ratel will change this medication.  Please let him know

## 2022-08-28 NOTE — Progress Notes (Signed)
Nursing Pain Medication Assessment:  Safety precautions to be maintained throughout the outpatient stay will include: orient to surroundings, keep bed in low position, maintain call bell within reach at all times, provide assistance with transfer out of bed and ambulation.  Medication Inspection Compliance: Pill count conducted under aseptic conditions, in front of the patient. Neither the pills nor the bottle was removed from the patient's sight at any time. Once count was completed pills were immediately returned to the patient in their original bottle.  Medication: Hydrocodone/APAP Pill/Patch Count:  20 of 150 pills remain Pill/Patch Appearance: Markings consistent with prescribed medication Bottle Appearance: Standard pharmacy container. Clearly labeled. Filled Date: 03 / 06 / 2024 Last Medication intake:  Yesterday

## 2022-08-28 NOTE — Progress Notes (Signed)
PROVIDER NOTE: Information contained herein reflects review and annotations entered in association with encounter. Interpretation of such information and data should be left to medically-trained personnel. Information provided to patient can be located elsewhere in the medical record under "Patient Instructions". Document created using STT-dictation technology, any transcriptional errors that may result from process are unintentional.    Patient: James Hood  Service Category: E/M  Provider: Edward Jolly, MD  DOB: Oct 24, 1940  DOS: 08/28/2022  Specialty: Interventional Pain Management  MRN: [id number]  Setting: Ambulatory outpatient  PCP: Glori Luis, MD  Type: Established Patient    Referring Provider: Glori Luis, MD  Location: Office  Delivery: Face-to-face     HPI  Mr. TAITUM ALMS, a 82 y.o. year old male, is here today because of his Lumbosacral radiculopathy [M54.17]. Mr. Delsignore's primary complain today is No chief complaint on file.  Last encounter: My last encounter with him was on 05/17/22  Pertinent problems: Mr. Mahl has Primary localized osteoarthritis of right knee; DJD (degenerative joint disease) of knee; Lumbar herniated disc; Chronic pain syndrome; Failed back surgical syndrome; Lumbosacral radiculopathy; Primary osteoarthritis of left ankle; Lumbar facet joint syndrome; Neuropathy; Chronic pain of left ankle; and Osteochondral defect of ankle on their pertinent problem list. Pain Assessment: Severity of Chronic pain is reported as a 5 /10. Location: Ankle Left/ . Onset: More than a month ago. Quality: Discomfort, Constant, Aching. Timing: Constant. Modifying factor(s): nothing is really helping currently.  medications are not helping as much.  Tylenol arthritis strength 2 tabs bid, ASA 500 mg 2 tabs bid.. Vitals:  height is 5\' 10"  (1.778 m) and weight is 165 lb (74.8 kg). His temporal temperature is 97.3 F (36.3 C) (abnormal). His blood pressure is 152/73  (abnormal) and his pulse is 58 (abnormal). His respiration is 16 and oxygen saturation is 98%.   Reason for encounter: medication management.   Presents today for medication management.   Continues to have persistent left ankle pain, has tried left ankle orthotic which was not helpful. He has also tried repeat left anterolateral ankle steroid injections with Dr. Ashley Royalty with limited response He would like to return back to his previous dose of oxycodone as he states hydrocodone is not effective Informed pt that I will NOT be escalating beyond 60 MME. I also discussed an opioid holiday with pt which he was not interested in. Continue with gabapentin as Rx'd We also discussed Nucynta as an alternative but his insurance would not cover this.  Pharmacotherapy Assessment  Analgesic:  Oxycodone 10 mg every 6 hours as needed, quantity 120/month; MME equals 60    Monitoring: [place] PMP: PDMP reviewed during this encounter.       Pharmacotherapy: No side-effects or adverse reactions reported. Compliance: No problems identified. Effectiveness: Clinically acceptable.  Vernie Ammons, RN  08/28/2022 10:24 AM  Sign when Signing Visit Nursing Pain Medication Assessment:  Safety precautions to be maintained throughout the outpatient stay will include: orient to surroundings, keep bed in low position, maintain call bell within reach at all times, provide assistance with transfer out of bed and ambulation.  Medication Inspection Compliance: Pill count conducted under aseptic conditions, in front of the patient. Neither the pills nor the bottle was removed from the patient's sight at any time. Once count was completed pills were immediately returned to the patient in their original bottle.  Medication: Hydrocodone/APAP Pill/Patch Count:  20 of 150 pills remain Pill/Patch Appearance: Markings consistent with prescribed medication Bottle Appearance: Standard  pharmacy container. Clearly labeled. Filled  Date: 03 / 06 / 2024 Last Medication intake:  Yesterday     UDS:  Summary  Date Value Ref Range Status  11/28/2021 Note  Final    Comment:    ==================================================================== [employer] (MW) ==================================================================== Test                             Result       Flag       Units  Drug Present and Declared for Prescription Verification   Oxycodone                      176          EXPECTED   ng/mg creat   Oxymorphone                    732          EXPECTED   ng/mg creat   Noroxycodone                   1900         EXPECTED   ng/mg creat   Noroxymorphone                 585          EXPECTED   ng/mg creat    Sources of oxycodone are scheduled prescription medications.    Oxymorphone, noroxycodone, and noroxymorphone are expected    metabolites of oxycodone. Oxymorphone is also available as a    scheduled prescription medication.    Phenobarbital                  PRESENT      EXPECTED    Phenobarbital is an expected metabolite of primidone; Phenobarbital    may also be administered as a prescription drug.  Drug Absent but Declared for Prescription Verification   Hydromorphone                  Not Detected UNEXPECTED ng/mg creat ==================================================================== Test                      Result    Flag   Units      Ref Range   Creatinine              34               mg/dL      >=93 ==================================================================== Declared Medications:  The flagging and interpretation on this report are based on the  following declared medications.  Unexpected results may arise from  inaccuracies in the declared medications.   **Note: The testing scope of this panel includes these medications:   Hydromorphone (Dilaudid)  Oxycodone (Percocet)  Primidone (Mysoline)   **Note: The testing scope of this panel does not include the   following reported medications:   Acetaminophen (Percocet)  Aspirin  Atropine (Lomotil)  Budesonide (Entocort)  Bupropion (Wellbutrin XL)  Cholestyramine (Questran)  Dapagliflozin (Farxiga)  Diphenoxylate (Lomotil)  Divaleproex (Depakote)  Donepezil (Aricept)  Doxycycline  Fenofibrate (TriCor)  Gabapentin (Neurontin)  Hydrochlorothiazide (Zestoretic)  Iron  Lisinopril (Zestoretic)  Memantine (Namenda)  Metformin (Glucophage)  Metoprolol (Toprol)  Multivitamin  Omega-3 Fatty Acids  Omeprazole (Prilosec)  Pramipexole (Mirapex)  Promethazine  Quetiapine (Seroquel)  Rosuvastatin (Crestor)  Semaglutide (Ozempic)  Tamsulosin (Flomax)  Trazodone (Desyrel)  Varenicline (Chantix)  Vitamin C ==================================================================== For clinical consultation, please call [phone]. ====================================================================      ROS  Constitutional: Denies any fever or chills Gastrointestinal: No reported hemesis, hematochezia, vomiting, or acute GI distress Musculoskeletal: Low back pain, left ankle pain Neurological: No reported episodes of acute onset apraxia, aphasia, dysarthria, agnosia, amnesia, paralysis, loss of coordination, or loss of consciousness  Medication Review  FreeStyle Libre 14 Day Sensor, Multiple Vitamins-Minerals, QUEtiapine, Semaglutide (1 MG/DOSE), ascorbic acid, aspirin EC, buPROPion, cholestyramine, ciclopirox, dapagliflozin propanediol, diphenoxylate-atropine, divalproex, donepezil, fenofibrate, gabapentin, lidocaine, lisinopril-hydrochlorothiazide, memantine, metFORMIN, metoprolol succinate, omega-3 acid ethyl esters, omeprazole, oxyCODONE-acetaminophen, pantoprazole, pramipexole, primidone, rosuvastatin, tamsulosin, and traZODone  History Review  Allergy: Mr. Jorene GuestComstock has No Known Allergies. Drug: Mr. Jorene GuestComstock  reports no history of drug use. Alcohol:  reports that he does not currently use  alcohol. Tobacco:  reports that he has been smoking cigarettes. He has a 30.00 pack-year smoking history. He has never used smokeless tobacco. Social: Mr. Jorene GuestComstock  reports that he has been smoking cigarettes. He has a 30.00 pack-year smoking history. He has never used smokeless tobacco. He reports that he does not currently use alcohol. He reports that he does not use drugs. 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 Surgical: Mr. Jorene GuestComstock  has a past surgical history that includes esophageal stretch; Tonsillectomy; Total knee arthroplasty (Right, 11/15/2014); Lumbar laminectomy/decompression microdiscectomy (Left, 02/03/2016); Colonoscopy with propofol (N/A, 09/14/2016); Joint replacement (Right, 2016); Pulse generator implant (N/A, 08/21/2017); Back surgery; Pulse generator implant (Right, 04/02/2018); Ankle arthroscopy (Left, 09/29/2019); and Esophagogastroduodenoscopy (egd) with propofol (N/A, 12/13/2020). Family: family history includes Depression in his father; Heart attack in his mother; Heart disease in his mother; Hypertension in his father.  Laboratory Chemistry Profile   Renal Lab Results  Component Value Date   BUN 27 (H) 08/27/2022   CREATININE 1.45 08/27/2022   GFR 44.86 (L) 08/27/2022   GFRAA >60 09/25/2019   GFRNONAA 55 (L) 06/08/2020    Hepatic Lab Results  Component Value Date   AST 23 08/27/2022   ALT 18 08/27/2022    ALBUMIN 4.1 08/27/2022   ALKPHOS 51 08/27/2022   AMMONIA 14 08/09/2016    Electrolytes Lab Results  Component Value Date   NA 138 08/27/2022   K 4.8 08/27/2022   CL 102 08/27/2022   CALCIUM 9.3 08/27/2022   MG 1.4 (L) 09/08/2020    Bone Lab Results  Component Value Date   VD25OH 37.09 11/10/2021    Inflammation (CRP: Acute Phase) (ESR: Chronic Phase) Lab Results  Component Value Date   ESRSEDRATE 15 07/20/2019   LATICACIDVEN 1.9 06/08/2020         Note: Above Lab results reviewed.   CLINICAL DATA:  Left sciatic pain for 2 years.  Prior back surgery.   EXAM: MRI LUMBAR SPINE WITHOUT CONTRAST   TECHNIQUE: Multiplanar, multisequence MR imaging of the lumbar spine was performed. No intravenous contrast was administered.   COMPARISON:  03/29/2016   FINDINGS: Segmentation:  Standard.   Alignment:  Physiologic.   Vertebrae:  No fracture, evidence of discitis, or bone lesion.   Conus medullaris and cauda  equina: Conus extends to the T12-L1 level. Conus and cauda equina appear normal.   Paraspinal and other soft tissues: No paraspinal abnormality.   Disc levels:   Disc spaces: Degenerative disc disease mild disc height loss throughout the lumbar spine.   T12-L1: No significant disc bulge. No evidence of neural foraminal stenosis. No central canal stenosis.   L1-L2: No significant disc bulge. No evidence of neural foraminal stenosis. No central canal stenosis.   L2-L3: No significant disc bulge. No evidence of neural foraminal stenosis. No central canal stenosis.   L3-L4: Mild broad-based disc bulge. Mild bilateral facet arthropathy. Moderate right foraminal stenosis. No central canal stenosis.   L4-L5: Mild broad-based disc bulge. Severe bilateral facet arthropathy. Bilateral lateral recess stenosis. Mild bilateral foraminal stenosis. No central canal stenosis.   L5-S1: Broad-based disc bulge with a small central disc protrusion. Moderate bilateral  facet arthropathy. Mild left foraminal stenosis. No central canal stenosis.   IMPRESSION: 1. At L3-4 there is a mild broad-based disc bulge. Mild bilateral facet arthropathy. Moderate right foraminal stenosis. 2. At L4-5 there is a mild broad-based disc bulge. Severe bilateral facet arthropathy. Bilateral lateral recess stenosis. Mild bilateral foraminal stenosis. 3. At L5-S1 there is a broad-based disc bulge with a small central disc protrusion. Moderate bilateral facet arthropathy. Mild left foraminal stenosis.     Electronically Signed   By: Elige Ko   On: 04/19/2017 10:33  Physical Exam  General appearance: Well nourished, well developed, and well hydrated. In no apparent acute distress Mental status: Alert, oriented x 3 (person, place, & time)       Respiratory: No evidence of acute respiratory distress Eyes: PERLA Vitals: BP (!) 152/73 (BP Location: Right Arm, Patient Position: Sitting, Cuff Size: Normal)   Pulse (!) 58   Temp (!) 97.3 F (36.3 C) (Temporal)   Resp 16   Ht 5\' 10"  (1.778 m)   Wt 165 lb (74.8 kg)   SpO2 98%   BMI 23.68 kg/m  BMI: Estimated body mass index is 23.68 kg/m as calculated from the following:   Height as of this encounter: 5\' 10"  (1.778 m).   Weight as of this encounter: 165 lb (74.8 kg). Ideal: Ideal body weight: 73 kg (160 lb 15 oz) Adjusted ideal body weight: 73.7 kg (162 lb 9 oz)  Cervical Spine Area Exam  Skin & Axial Inspection: No masses, redness, edema, swelling, or associated skin lesions Alignment: Symmetrical Functional ROM: Unrestricted ROM      Stability: No instability detected Muscle Tone/Strength: Functionally intact. No obvious neuro-muscular anomalies detected. Sensory (Neurological): Unimpaired Palpation: No palpable anomalies              Lumbar Spine Area Exam  Skin & Axial Inspection: No masses, redness, or swelling Alignment: Symmetrical Functional ROM: Pain restricted ROM       Stability: No instability  detected Muscle Tone/Strength: Functionally intact. No obvious neuro-muscular anomalies detected. Sensory (Neurological): Musculoskeletal pain pattern articular, facet mediated Palpation: No palpable anomalies        Gait & Posture Assessment  Ambulation: Unassisted Gait: Relatively normal for age and body habitus Posture: WNL  Lower Extremity Exam    Side: Right lower extremity  Side: Left lower extremity  Stability: No instability observed          Stability: No instability observed          Skin & Extremity Inspection: Skin color, temperature, and hair growth are WNL. No peripheral edema or cyanosis. No masses, redness,  swelling, asymmetry, or associated skin lesions. No contractures.  Skin & Extremity Inspection: Skin color, temperature, and hair growth are WNL. No peripheral edema or cyanosis. No masses, redness, swelling, asymmetry, or associated skin lesions. No contractures.  Functional ROM: Unrestricted ROM                  Functional ROM: Pain restricted ROM  left ankle          Muscle Tone/Strength: Functionally intact. No obvious neuro-muscular anomalies detected.  Muscle Tone/Strength: Functionally intact. No obvious neuro-muscular anomalies detected.  Sensory (Neurological): Unimpaired        Sensory (Neurological): Arthropathic arthralgia ankle        DTR: Patellar: deferred today Achilles: deferred today Plantar: deferred today  DTR: Patellar: deferred today Achilles: deferred today Plantar: deferred today  Palpation: No palpable anomalies  Palpation: No palpable anomalies    Assessment   Diagnosis  1. Lumbosacral radiculopathy   2. Chronic pain of left ankle   3. Primary osteoarthritis of left ankle   4. Osteochondral defect of ankle (left)   5. Lumbar facet joint syndrome   6. Facet arthritis of lumbar region   7. Chronic pain syndrome        Plan of Care   [name] has a current medication list which includes the following long-term  medication(s): bupropion, cholestyramine, donepezil, fenofibrate, gabapentin, lisinopril-hydrochlorothiazide, memantine, metformin, metoprolol succinate, omega-3 acid ethyl esters, omeprazole, pramipexole, primidone, quetiapine, rosuvastatin, and [START ON 09/03/2022] trazodone.   Meds ordered this encounter  Medications   oxyCODONE-acetaminophen (PERCOCET) 10-325 MG tablet    Sig: Take 1 tablet by mouth every 6 (six) hours as needed for pain. Must last 30 days.    Dispense:  120 tablet    Refill:  0    Chronic Pain: STOP Act (Not applicable) Fill 1 day early if closed on refill date. Avoid benzodiazepines within 8 hours of opioids   oxyCODONE-acetaminophen (PERCOCET) 10-325 MG tablet    Sig: Take 1 tablet by mouth every 6 (six) hours as needed for pain. Must last 30 days.    Dispense:  120 tablet    Refill:  0    Chronic Pain: STOP Act (Not applicable) Fill 1 day early if closed on refill date. Avoid benzodiazepines within 8 hours of opioids   oxyCODONE-acetaminophen (PERCOCET) 10-325 MG tablet    Sig: Take 1 tablet by mouth every 6 (six) hours as needed for pain. Must last 30 days.    Dispense:  120 tablet    Refill:  0    Chronic Pain: STOP Act (Not applicable) Fill 1 day early if closed on refill date. Avoid benzodiazepines within 8 hours of opioids   Continue with psychiatric care  Continue with Gabapentin UDS UTD   Follow-up plan:   No follow-ups on file.    Recent Visits No visits were found meeting these conditions. Showing recent visits within past 90 days and meeting all other requirements Today's Visits Date Type Provider Dept  08/28/22 Office Visit Edward Jolly, MD [facility]  Showing today's visits and meeting all other requirements Future Appointments No visits were found meeting these conditions. Showing future appointments within next 90 days and meeting all other requirements  I discussed the assessment and treatment plan with the patient. The  patient was provided an opportunity to ask questions and all were answered. The patient agreed with the plan and demonstrated an understanding of the instructions.  Patient advised to call back or  seek an in-person evaluation if the symptoms or condition worsens.  Duration of encounter: 30 minutes.  Note by: Edward Jolly, MD Date: 08/28/2022; Time: 2:31 PM

## 2022-08-29 ENCOUNTER — Telehealth: Payer: Self-pay

## 2022-08-29 ENCOUNTER — Other Ambulatory Visit: Payer: Self-pay | Admitting: Family Medicine

## 2022-08-29 DIAGNOSIS — I10 Essential (primary) hypertension: Secondary | ICD-10-CM

## 2022-08-29 DIAGNOSIS — E782 Mixed hyperlipidemia: Secondary | ICD-10-CM

## 2022-08-29 NOTE — Telephone Encounter (Signed)
Left message to call the office back regarding lab results  

## 2022-08-29 NOTE — Telephone Encounter (Signed)
-----   Message from Glori Luis, MD sent at 08/29/2022 10:48 AM EDT ----- He should cut down on the aspirin use and have a BMET rechecked in 2-3 weeks to see if his kidney function has improved.

## 2022-08-30 ENCOUNTER — Other Ambulatory Visit: Payer: Self-pay

## 2022-08-30 ENCOUNTER — Telehealth: Payer: Self-pay

## 2022-08-30 ENCOUNTER — Encounter: Payer: PPO | Admitting: Student in an Organized Health Care Education/Training Program

## 2022-08-30 NOTE — Addendum Note (Signed)
Addended by: Prince Solian A on: 08/30/2022 11:05 AM   Modules accepted: Orders

## 2022-08-30 NOTE — Telephone Encounter (Signed)
Noted  

## 2022-08-30 NOTE — Telephone Encounter (Signed)
Pt returned [facility] CMA call. Note below was read to pt. Pt aware and understood. I booked him in 2 weeks again for blood work on 4/29, however, theres no orders in.

## 2022-08-30 NOTE — Telephone Encounter (Signed)
Talked with patient. He had appt scheduled with Dr August Saucer for 04/12 to discuss ankle surgery/options.  Unfortunately we had to cancel this but when talking with patient advised him Dr Lajoyce Corners is our foot/ankle specialist and he agreed to see him instead. Appt scheduled.

## 2022-08-31 ENCOUNTER — Ambulatory Visit: Payer: PPO | Admitting: Orthopedic Surgery

## 2022-08-31 ENCOUNTER — Other Ambulatory Visit: Payer: Self-pay | Admitting: Psychiatry

## 2022-08-31 NOTE — Addendum Note (Signed)
Addended by: Prince Solian A on: 08/31/2022 12:12 PM   Modules accepted: Orders

## 2022-09-06 ENCOUNTER — Ambulatory Visit: Payer: PPO | Admitting: Orthopedic Surgery

## 2022-09-13 ENCOUNTER — Telehealth: Payer: Self-pay | Admitting: *Deleted

## 2022-09-13 NOTE — Telephone Encounter (Signed)
Please advise patient that medication Ozempic is ready for pick up see if can pick up today.

## 2022-09-17 ENCOUNTER — Other Ambulatory Visit: Payer: PPO

## 2022-09-20 NOTE — Addendum Note (Signed)
Addended by: Warden Fillers on: 09/20/2022 02:41 PM   Modules accepted: Orders

## 2022-09-21 ENCOUNTER — Other Ambulatory Visit: Payer: PPO

## 2022-09-24 ENCOUNTER — Telehealth: Payer: Self-pay | Admitting: Family Medicine

## 2022-09-24 NOTE — Telephone Encounter (Signed)
Pt called stating he would like to be prescribe some anxiety medication

## 2022-09-24 NOTE — Telephone Encounter (Signed)
He would need a visit to discuss this. He was previously seeing psychiatry. Is he still seeing them? If he is still seeing them he should contact them to discuss appropriate management of anxiety.

## 2022-09-25 ENCOUNTER — Encounter: Payer: Self-pay | Admitting: Internal Medicine

## 2022-09-25 NOTE — Telephone Encounter (Signed)
Patient is still seeing Psychiatry and will contact them regarding the Anxiety.

## 2022-09-26 ENCOUNTER — Ambulatory Visit (INDEPENDENT_AMBULATORY_CARE_PROVIDER_SITE_OTHER): Payer: PPO | Admitting: Licensed Clinical Social Worker

## 2022-09-26 ENCOUNTER — Encounter: Admission: RE | Payer: Self-pay | Source: Home / Self Care

## 2022-09-26 ENCOUNTER — Ambulatory Visit: Admission: RE | Admit: 2022-09-26 | Payer: PPO | Source: Home / Self Care | Admitting: Internal Medicine

## 2022-09-26 DIAGNOSIS — F3341 Major depressive disorder, recurrent, in partial remission: Secondary | ICD-10-CM

## 2022-09-26 DIAGNOSIS — F411 Generalized anxiety disorder: Secondary | ICD-10-CM | POA: Diagnosis not present

## 2022-09-26 SURGERY — ESOPHAGOGASTRODUODENOSCOPY (EGD) WITH PROPOFOL
Anesthesia: General

## 2022-09-26 NOTE — Progress Notes (Addendum)
Virtual Visit via Video Note  I connected with James Hood on 09/26/22 at  8:00 AM EDT by a video enabled telemedicine application and verified that I am speaking with the correct person using two identifiers.  Location: Patient: home Provider:[facility]   I discussed the limitations of evaluation and management by telemedicine and the availability of in person appointments. The patient expressed understanding and agreed to proceed.   I discussed the assessment and treatment plan with the patient. The patient was provided an opportunity to ask questions and all were answered. The patient agreed with the plan and demonstrated an understanding of the instructions.   The patient was advised to call back or seek an in-person evaluation if the symptoms worsen or if the condition fails to improve as anticipated.  I provided 45 minutes of non-face-to-face time during this encounter.   Semaja Lymon R Kristyn Obyrne, LCSW   THERAPIST PROGRESS NOTE  Session Time: [id number]  Participation Level: Active  Behavioral Response: Neat and Well GroomedAlertAnxious  Type of Therapy: Individual Therapy  Treatment Goals addressed:LTG: Reduce frequency, intensity, and duration of depression symptoms as evidenced by: pt self report  ProgressTowards Goals: Not Progressing  Interventions: CBT  Summary: James Hood is a 82 y.o. male who presents with continuing symptoms related to his depression diagnosis. Pt reports more anxiety symptoms at today's session than in the past. Pt reporting anxious mood, irritability, agitation, restlessness, worrying, and thoughts jumping to worst case scenario. Pt reports that he is compliant with his medication--reached out to his PCP for additional medication to help manage anxiety and was redirected to psychiatrist.  LCSW clinician did reach out to inform psychiatric staff of pt request.   Assisted pt with identifying anxiety  triggers--recent decision to sell house and move into a camper. Allowed pt to explore and identify any limits or boundaries that could be helpful with managing stress/anxiety triggers.   Explored current family-based issues/concerns. Pt reports that one of the main anxiety triggers related to family relationships is the decision to buy camper and move it to daughters land after the sale of his home. Allowed pt to identify things that he can control, and things that are out of his control. Discussed relationships with children and grandchildren.   Clinician assisted pt with identifying current levels of chronic pain (ankle), and explored medical interventions for treating chronic pain. Discussed overall impact of pain on daily activities, relationships, and ability/inability to engage in self care or recreational activities. Pt is worried that the pain will slow down packing/moving progress.  Continued recommendations are as follows: self care behaviors, positive social engagements, focusing on overall work/home/life balance, and focusing on positive physical and emotional wellness.  Suicidal/Homicidal: No  Therapist Response: Pt is continuing to apply interventions learned in session into daily life situations. Pt is currently on track to meet goals utilizing interventions mentioned above. Personal growth and progress noted. Treatment to continue as indicated.   Plan: Return again in 4 weeks.  Diagnosis:  Encounter Diagnoses  Name Primary?   MDD (major depressive disorder), recurrent, in partial remission (HCC) Yes   Generalized anxiety disorder    Collaboration of Care: Other Pt encouraged to continue psychiatric care with Dr. [name]  Patient/Guardian was advised Release of Information must be obtained prior to any record release in order to collaborate their care with an outside provider. Patient/Guardian was advised if they have not already done so to contact the registration  department to sign all necessary  forms in order for [place] to release information regarding their care.   Consent: Patient/Guardian gives verbal consent for treatment and assignment of benefits for services provided during this visit. Patient/Guardian expressed understanding and agreed to proceed.   Ernest Haber Asiana Benninger, LCSW 09/26/2022

## 2022-09-28 ENCOUNTER — Other Ambulatory Visit (INDEPENDENT_AMBULATORY_CARE_PROVIDER_SITE_OTHER): Payer: PPO | Admitting: Radiology

## 2022-09-28 ENCOUNTER — Encounter: Payer: Self-pay | Admitting: Family Medicine

## 2022-09-28 ENCOUNTER — Ambulatory Visit (INDEPENDENT_AMBULATORY_CARE_PROVIDER_SITE_OTHER): Payer: PPO | Admitting: Family Medicine

## 2022-09-28 VITALS — BP 128/78 | HR 78 | Ht 70.0 in | Wt 165.0 lb

## 2022-09-28 DIAGNOSIS — G8929 Other chronic pain: Secondary | ICD-10-CM

## 2022-09-28 DIAGNOSIS — M7918 Myalgia, other site: Secondary | ICD-10-CM

## 2022-09-28 DIAGNOSIS — M19072 Primary osteoarthritis, left ankle and foot: Secondary | ICD-10-CM

## 2022-09-28 MED ORDER — DULOXETINE HCL 30 MG PO CPEP
ORAL_CAPSULE | ORAL | 0 refills | Status: DC
Start: 2022-09-28 — End: 2022-11-05

## 2022-09-28 MED ORDER — TRIAMCINOLONE ACETONIDE 40 MG/ML IJ SUSP
40.0000 mg | Freq: Once | INTRAMUSCULAR | Status: AC
Start: 2022-09-28 — End: 2022-09-28
  Administered 2022-09-28: 40 mg via INTRAMUSCULAR

## 2022-09-28 NOTE — Progress Notes (Signed)
Primary Care / Sports Medicine Office Visit  Patient Information:  Patient ID: James Hood, male DOB: [birth year] Age: 82 y.o. MRN: [id number]   James Hood is a pleasant 83 y.o. male presenting with the following:  Chief Complaint  Patient presents with   Primary osteoarthritis of left ankle    Vitals:   09/28/22 0858  BP: 128/78  Pulse: 78  SpO2: 97%   Vitals:   09/28/22 0858  Weight: 165 lb (74.8 kg)  Height: 5\' 10"  (1.778 m)   Body mass index is 23.68 kg/m.  No results found.   Independent interpretation of notes and tests performed by another provider:   None  Procedures performed:   Procedure:  Injection of left ankle joint under ultrasound guidance. Ultrasound guidance utilized for out of plane approach, no effusion noted  Samsung HS60 device utilized with permanent recording / reporting. Verbal informed consent obtained and verified. Skin prepped in a sterile fashion. Ethyl chloride for topical local analgesia.  Completed without difficulty and tolerated well. Medication: triamcinolone acetonide 40 mg/mL suspension for injection 1 mL total and 2 mL lidocaine 1% without epinephrine utilized for needle placement anesthetic Advised to contact for fevers/chills, erythema, induration, drainage, or persistent bleeding.  Pertinent History, Exam, Impression, and Recommendations:   James Hood was seen today for primary osteoarthritis of left ankle.  Primary osteoarthritis of left ankle Assessment & Plan: Patient has upcoming visit with orthopedic group during the July timeframe.  Endorsing significant symptomatology despite his current medication regimen.  As such we reviewed adjunct options and he did elect to proceed with left ankle intra-articular cortisone injection.  I did discuss with him that joint replacement surgery would most likely not be possible for 3 months postinjection due to the effects of steroid, he expressed understanding of this and  still elected to proceed with injection.  We also reviewed adjunct treatment strategies inclusive of obtaining scanogram to assess for leg length discrepancy, if noted referral to podiatry for orthotics/lifts to be considered.  Lastly, will start duloxetine for chronic musculoskeletal pain.  Plan as follows: - Did proceed with ultrasound-guided intra-articular corticosteroid injection to the left ankle talotibial articulation today - Start duloxetine 30 mg x 1 week then escalate to 60 mg, can contact us for refill of 60 mg capsule - Obtain CT scanogram, pending results will coordinate next steps  Orders: -     [place] LIMITED JOINT SPACE STRUCTURES LOW LEFT; Future -     DULoxetine HCl; Take 1 capsule (30 mg total) by mouth every evening for 7 days, THEN 2 capsules (60 mg total) every evening.  Dispense: 73 capsule; Refill: 0 -     CT SCANOGRAM; Future -     Triamcinolone Acetonide  Chronic musculoskeletal pain -     DULoxetine HCl; Take 1 capsule (30 mg total) by mouth every evening for 7 days, THEN 2 capsules (60 mg total) every evening.  Dispense: 73 capsule; Refill: 0 -     Triamcinolone Acetonide     Orders & Medications Meds ordered this encounter  Medications   DULoxetine (CYMBALTA) 30 MG capsule    Sig: Take 1 capsule (30 mg total) by mouth every evening for 7 days, THEN 2 capsules (60 mg total) every evening.    Dispense:  73 capsule    Refill:  0   triamcinolone acetonide (KENALOG-40) injection 40 mg   Orders Placed This Encounter  Procedures   [place] LIMITED JOINT SPACE STRUCTURES LOW LEFT  CT SCANOGRAM     No follow-ups on file.     Jerrol Banana, MD, [facility]   Primary Care Sports Medicine Primary Care and Sports Medicine at [facility]

## 2022-09-28 NOTE — Patient Instructions (Addendum)
You have just been given a cortisone injection to reduce pain and inflammation. After the injection you may notice immediate relief of pain as a result of the Lidocaine. It is important to rest the area of the injection for 24 to 48 hours after the injection. There is a possibility of some temporary increased discomfort and swelling for up to 72 hours until the cortisone begins to work. If you do have pain, simply rest the joint and use ice. If you can tolerate over the counter medications, you can try Tylenol, Aleve, or Advil for added relief per package instructions. - Start duloxetine, 1 capsule (30 mg) daily x 1 week - After 1 week increase to 2 capsules daily - Can contact us for refills - Obtain CT scanogram once scheduled, we will contact you with next steps - Maintain follow-up with orthopedic group - Contact for any questions/concerns, follow-up as needed

## 2022-09-28 NOTE — Assessment & Plan Note (Signed)
Patient has upcoming visit with orthopedic group during the July timeframe.  Endorsing significant symptomatology despite his current medication regimen.  As such we reviewed adjunct options and he did elect to proceed with left ankle intra-articular cortisone injection.  I did discuss with him that joint replacement surgery would most likely not be possible for 3 months postinjection due to the effects of steroid, he expressed understanding of this and still elected to proceed with injection.  We also reviewed adjunct treatment strategies inclusive of obtaining scanogram to assess for leg length discrepancy, if noted referral to podiatry for orthotics/lifts to be considered.  Lastly, will start duloxetine for chronic musculoskeletal pain.  Plan as follows: - Did proceed with ultrasound-guided intra-articular corticosteroid injection to the left ankle talotibial articulation today - Start duloxetine 30 mg x 1 week then escalate to 60 mg, can contact us for refill of 60 mg capsule - Obtain CT scanogram, pending results will coordinate next steps

## 2022-10-05 NOTE — Progress Notes (Unsigned)
BH MD/PA/NP OP Progress Note  10/08/2022 8:37 AM James Hood  MRN:  [id number]  Chief Complaint:  Chief Complaint  Patient presents with   Follow-up   HPI:  This is a follow-up appointment for depression and insomnia.  He states that he is doing terrible.  He will be moving into her camper as they were unable to afford the monthly payment.  His wife lost, concerning the beautiful garden she used to be working on every day (and was found out that she was not taking clonazepam).  Although he feels thankful that his child allowed them to stay within their property, he feels stressed about this.  He almost had an panic attack when he was sharing this with his orthopedic doctor.  He also exploded in the relationship with his stepdaughter.  It occurred in front of his wife.  He has intense anxiety and has been feeling irritable.  He has lost appetite, and lost his weight.  This has been going on for the past 2 months.  He wants to  adjust his medication.  He was prescribed duloxetine by his PCP, and has been taking this for a week without any side effect.  He would like to stay on this medication to see how it will be helpful.The patient has mood symptoms as in PHQ-9/GAD-7. He sleeps very well on the medication he takes. He denies SI.  Although he denies any dizziness, he fell a few times when he bent down.   Wt Readings from Last 3 Encounters:  10/08/22 159 lb 6.4 oz (72.3 kg)  09/28/22 165 lb (74.8 kg)  08/28/22 165 lb (74.8 kg)     Visit Diagnosis:    ICD-10-CM   1. MDD (major depressive disorder), recurrent episode, mild (HCC)  F33.0     2. Insomnia, unspecified type  G47.00       Past Psychiatric History: Please see initial evaluation for full details. I have reviewed the history. No updates at this time.     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/2017   Colon polyps    Dementia (HCC)    Depression    Diabetes mellitus type 2 in nonobese (HCC)    GERD (gastroesophageal reflux disease)    BARRETTS ESOPHAGUS RESOLVED PER PATIENT   Headache    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HCC)    History of hiatal hernia    Hypercholesteremia    Hypertension    Hyperthyroidism    Neuropathic pain    Primary localized osteoarthritis of right knee    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   Stomach ulcer    Tremor, essential    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/02/2018    Past Surgical History:  Procedure Laterality Date   ANKLE ARTHROSCOPY Left 09/29/2019   Procedure: LEFT ANKLE ARTHROSCOPY, DEBRIDEMENT;  Surgeon: Nadara Mustard, MD;  Location: [facility];  Service: Orthopedics;  Laterality: Left;   BACK SURGERY     COLONOSCOPY WITH PROPOFOL N/A 09/14/2016   Procedure: COLONOSCOPY WITH PROPOFOL;  Surgeon: Scot Jun, MD;  Location: [facility] ENDOSCOPY;  Service: Endoscopy;  Laterality: N/A;   esophageal stretch     ESOPHAGOGASTRODUODENOSCOPY (EGD) WITH PROPOFOL N/A 12/13/2020   Procedure: ESOPHAGOGASTRODUODENOSCOPY (EGD) WITH PROPOFOL;  Surgeon: Regis Bill,  MD;  Location: [facility] ENDOSCOPY;  Service: Endoscopy;  Laterality: N/A;  IDDM   JOINT REPLACEMENT Right 2016   knee   LUMBAR LAMINECTOMY/DECOMPRESSION MICRODISCECTOMY Left 02/03/2016   Procedure: LEFT L5-S1 DISKECTOMY;  Surgeon: Hilda Lias, MD;  Location: [facility]URO ORS;  Service: Neurosurgery;  Laterality: Left;  LEFT L5-S1 DISKECTOMY   PULSE GENERATOR IMPLANT N/A 08/21/2017   Procedure: UNILATERAL PULSE GENERATOR IMPLANT;  Surgeon: Venetia Night, MD;  Location: [facility] ORS;  Service: Neurosurgery;  Laterality: N/A;   PULSE GENERATOR IMPLANT Right 04/02/2018   Procedure: REMOVAL OF PULSE GENERATOR IMPLANT AND LEADS;  Surgeon: Venetia Night, MD;  Location: [facility] ORS;  Service: Neurosurgery;  Laterality:  Right;   TONSILLECTOMY     TOTAL KNEE ARTHROPLASTY Right 11/15/2014   Procedure: TOTAL KNEE ARTHROPLASTY;  Surgeon: Salvatore Marvel, MD;  [facility];  Service: Orthopedics;  Laterality: Right;    Family Psychiatric History: Please see initial evaluation for full details. I have reviewed the history. No updates at this time.     Family History:  Family History  Problem Relation Age of Onset   Heart attack Mother    Heart disease Mother    Hypertension Father    Depression Father    Kidney cancer Neg Hx    Prostate cancer Neg Hx     Social History:  Social History   Socioeconomic History   Marital status: Married    Spouse name: Not on file   Number of children: Not on file   Years of education: Not on file   Highest education level: Master's degree (e.g., MA, MS, MEng, MEd, MSW, MBA)  Occupational History   Not on file  Tobacco Use   Smoking status: Every Day    Packs/day: 0.50    Years: 60.00    Additional pack years: 0.00    Total pack years: 30.00    Types: Cigarettes   Smokeless tobacco: Never  Vaping Use   Vaping Use: Former  Substance and Sexual Activity   Alcohol use: Not Currently    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   Drug use: Never   Sexual activity: Not Currently  Other Topics Concern   Not on file  Social History Narrative   Not on file   Social Determinants of Health   Financial Resource Strain: Low Risk  (09/25/2022)   Overall Financial Resource Strain (CARDIA)    Difficulty of Paying Living Expenses: Not hard at all  Food Insecurity: No Food Insecurity (09/25/2022)   Hunger Vital Sign    Worried About Running Out of Food in the Last Year: Never true    Ran Out of Food in the Last Year: Never true  Transportation Needs: No Transportation Needs (09/25/2022)   PRAPARE - Administrator, Civil Service (Medical): No    Lack of Transportation (Non-Medical): No  Physical Activity: Unknown (09/25/2022)   Exercise Vital Sign    Days  of Exercise per Week: 0 days    Minutes of Exercise per Session: Not on file  Stress: Stress Concern Present (09/25/2022)   [employer] of Occupational Health - Occupational Stress Questionnaire    Feeling of Stress : To some extent  Social Connections: Moderately Integrated (09/25/2022)   Social Connection and Isolation Panel [NHANES]    Frequency of Communication with Friends and Family: More than three times a week    Frequency of Social Gatherings with Friends and Family: More than three times a week    Attends Religious  Services: More than 4 times per year    Active Member of Clubs or Organizations: No    Attends Engineer, structural: Not on file    Marital Status: Married    Allergies: No Known Allergies  Metabolic Disorder Labs: Lab Results  Component Value Date   HGBA1C 6.9 (H) 08/27/2022   MPG 131 01/25/2016   MPG 166 11/05/2014   No results found for: "PROLACTIN" Lab Results  Component Value Date   CHOL 119 08/27/2022   TRIG 83.0 08/27/2022   HDL 65.70 08/27/2022   CHOLHDL 2 08/27/2022   VLDL 16.6 08/27/2022   LDLCALC 37 08/27/2022   LDLCALC 57 09/25/2017   Lab Results  Component Value Date   TSH 1.08 08/02/2021   TSH 0.97 10/11/2020    Therapeutic Level Labs: No results found for: "LITHIUM" No results found for: "VALPROATE" No results found for: "CBMZ"  Current Medications: Current Outpatient Medications  Medication Sig Dispense Refill   aspirin EC 81 MG tablet Take 81 mg by mouth daily.     buPROPion (WELLBUTRIN XL) 300 MG 24 hr tablet Take 1 tablet (300 mg total) by mouth daily. 90 tablet 1   cholestyramine (QUESTRAN) 4 g packet Take 1 packet by mouth 2 (two) times daily.     ciclopirox (PENLAC) 8 % solution Apply topically at bedtime. Apply over nail and surrounding skin. Apply daily over previous coat. After seven (7) days, may remove with alcohol and continue cycle. 6.6 mL 0   Continuous Blood Gluc Sensor (FREESTYLE LIBRE 14 DAY SENSOR)  MISC APPLY 1 SENSOR EVERY 14 DAYS TO MONITOR BLOOD GLUCOSE LEVELS. REMOVE OLD SENSOR BEFORE APPLYING NEW SENSOR 2 each 1   Continuous Blood Gluc Sensor (FREESTYLE LIBRE 14 DAY SENSOR) MISC APPLY 1 SENSOR EVERY 14 DAYS TO MONITOR BLOOD GLUCOSE LEVELS. REMOVE OLD SENSOR BEFORE APPLYING NEW SENSOR 2 each 5   dapagliflozin propanediol (FARXIGA) 10 MG TABS tablet Take 1 tablet (10 mg total) by mouth daily. 90 tablet 3   diphenoxylate-atropine (LOMOTIL) 2.5-0.025 MG tablet Take by mouth.     divalproex (DEPAKOTE ER) 500 MG 24 hr tablet Take 1 tablet by mouth 2 (two) times daily.     donepezil (ARICEPT) 10 MG tablet Take 10 mg by mouth at bedtime.     DULoxetine (CYMBALTA) 30 MG capsule Take 1 capsule (30 mg total) by mouth every evening for 7 days, THEN 2 capsules (60 mg total) every evening. 73 capsule 0   fenofibrate (TRICOR) 145 MG tablet TAKE 1 TABLET BY MOUTH DAILY 90 tablet 1   gabapentin (NEURONTIN) 600 MG tablet TAKE TWO TABLETS BY MOUTH THREE TIMES A DAY 180 tablet 1   lidocaine (LIDODERM) 5 % Place 1 patch onto the skin every 12 (twelve) hours. Remove & Discard patch within 12 hours or as directed by MD 30 patch 2   lisinopril-hydrochlorothiazide (ZESTORETIC) 20-25 MG tablet TAKE 1 TABLET BY MOUTH TWICE A DAY 180 tablet 1   memantine (NAMENDA) 10 MG tablet Take 1 tablet by mouth daily.     metFORMIN (GLUCOPHAGE-XR) 500 MG 24 hr tablet TAKE 1 TABLET BY MOUTH TWICE A DAY 180 tablet 1   metoprolol succinate (TOPROL-XL) 25 MG 24 hr tablet Take 25 mg by mouth daily.     Multiple Vitamins-Minerals (CENTRUM SILVER PO) Take 1 tablet by mouth daily.     omega-3 acid ethyl esters (LOVAZA) 1 g capsule TAKE TWO CAPSULES BY MOUTH TWICE A DAY 360 capsule 3   omeprazole (  PRILOSEC) 20 MG capsule Take 20 mg by mouth daily.     oxyCODONE-acetaminophen (PERCOCET) 10-325 MG tablet Take 1 tablet by mouth every 6 (six) hours as needed for pain. Must last 30 days. 120 tablet 0   [START ON 10/27/2022]  oxyCODONE-acetaminophen (PERCOCET) 10-325 MG tablet Take 1 tablet by mouth every 6 (six) hours as needed for pain. Must last 30 days. 120 tablet 0   pantoprazole (PROTONIX) 40 MG tablet Take 40 mg by mouth daily.     pramipexole (MIRAPEX) 0.125 MG tablet TAKE 1 TABLET BY MOUTH DAILY 2-3 HOURS PRIOR TO BEDTIME 90 tablet 0   primidone (MYSOLINE) 50 MG tablet Take 150-250 mg by mouth 2 (two) times daily. Take 250 mg by mouth in the morning & take 150 mg at night.     QUEtiapine (SEROQUEL) 25 MG tablet Take 75 mg by mouth at bedtime.     rosuvastatin (CRESTOR) 40 MG tablet TAKE ONE TABLET BY MOUTH DAILY 90 tablet 3   Semaglutide, 1 MG/DOSE, (OZEMPIC, 1 MG/DOSE,) 4 MG/3ML SOPN Inject 1 mg into the skin once a week. 3 mL 3   tamsulosin (FLOMAX) 0.4 MG CAPS capsule TAKE 2 CAPSULES BY MOUTH DAILY AFTER SUPPER 180 capsule 0   traZODone (DESYREL) 100 MG tablet Take 2 tablets (200 mg total) by mouth at bedtime as needed for sleep. 180 tablet 0   vitamin C (ASCORBIC ACID) 500 MG tablet Take 500 mg by mouth daily.     No current facility-administered medications for this visit.     Musculoskeletal: Strength & Muscle Tone: within normal limits Gait & Station:  uses a cane Patient leans: N/A  Psychiatric Specialty Exam: Review of Systems  Psychiatric/Behavioral:  Positive for dysphoric mood. Negative for agitation, behavioral problems, confusion, decreased concentration, hallucinations, self-injury, sleep disturbance and suicidal ideas. The patient is nervous/anxious. The patient is not hyperactive.   All other systems reviewed and are negative.   Blood pressure 118/76, pulse 96, temperature 97.8 F (36.6 C), temperature source Temporal, height 5\' 10"  (1.778 m), weight 159 lb 6.4 oz (72.3 kg), SpO2 99 %.Body mass index is 22.87 kg/m.  General Appearance: Fairly Groomed  Eye Contact:  Good  Speech:  Clear and Coherent  Volume:  Normal  Mood:  Depressed  Affect:  Appropriate, Congruent, and calm   Thought Process:  Coherent  Orientation:  Full (Time, Place, and Person)  Thought Content: Logical   Suicidal Thoughts:  No  Homicidal Thoughts:  No  Memory:  Immediate;   Good  Judgement:  Good  Insight:  Good  Psychomotor Activity:  Normal, Normal tone, no rigidity, no resting/postural tremors, no tardive dyskinesia    Concentration:  Concentration: Good and Attention Span: Good  Recall:  Good  Fund of Knowledge: Good  Language: Good  Akathisia:  No  Handed:  Right  AIMS (if indicated): not done  Assets:  Communication Skills Desire for Improvement  ADL's:  Intact  Cognition: WNL  Sleep:  Good   Screenings: GAD-7    Flowsheet Row Office Visit from 10/08/2022 in [facility] Counselor from 09/26/2022 in [facility] at [facility] Visit from 08/27/2022 in [facility] [employer] Visit from 07/18/2022 in [facility] Medicine at [facility] Office Visit from 07/16/2022 in [facility]  Total GAD-7 Score 11 20 1 1 2       Mini-Mental    Flowsheet  Row Clinical Support from 11/15/2017 in [facility] Clinical Support from 11/14/2016 in [facility] at [employer]  Total Score (max 30 points ) 30 30      PHQ2-9    Flowsheet Row Office Visit from 10/08/2022 in [facility] Counselor from 09/26/2022 in [facility] at [employer] [facility] Visit from 08/27/2022 in [facility] at [employer] from 07/25/2022 in [facility] at [facility] Visit from 07/18/2022 in [facility] Primary Care & Sports Medicine at [facility]  PHQ-2 Total Score 2 1 0 0 0  PHQ-9 Total Score 5 -- 2 -- 2      Flowsheet Row Counselor from 09/26/2022  in [facility] at [facility] from 07/25/2022 in [facility] at [facility] from 05/10/2022 in [facility] Outpatient Behavioral Health at [facility] RISK CATEGORY No Risk No Risk No Risk        Assessment and Plan:  James Hood is a 82 y.o. year old male with a history of  pseudo dementia, sleep disorder, tremor, history of QTc prolongation , who presents for follow up appointment for below.    1. MDD (major depressive disorder), recurrent episode, mild (HCC) Acute stressors include: ankle pain, moving into a camper due to financial strain  Other stressors include: conflict with his step son, financial strain    History:   There has been significant worsening in depressive symptoms and anxiety in the context of stressors as above.  Although it was discussed to consider uptitration of bupropion the first to avoid polypharmacy, he would like to stay on duloxetine at this time, which has been prescribed by his PCP.  Will honor his request.  Will continue duloxetine along with bupropion to target depression.  He will continue to see Ms. Hussami for therapy.   2. Insomnia, unspecified type Stable.  Will continue trazodone as needed for insomnia. He is informed again regarding its potential risk of drowsiness and fall especially with concomitant use of quetiapine, gabapentin and opioid.    # Alcohol use disorder in sustained remission  History:  He began drinking in college but has been abstinent since the age of 13. He actively participates in [employer]. Although he reports occasional craving, he has been abstinent from alcohol.  Will continue motivational interview.    # History of cognitive impairment Followed-up by neurologist, and has been on memantine and donepezil. ("Memory evaluation" 28/30 on 04/2022))      Plan Continue bupropion XR 300 mg daily  Continue duloxetine (uptitrated to 60  mg starting from today)- prescribed by his PCP Continue Trazodone 200 mg at night as needed for insomnia  Next appointment: 7/23 at 8 30 for 30 mins, IP - on quetiapine 75 mg at night (qtc 425 msec with iRBBB 06/08/2020)- prescribed by another provider - on Donepezil 10 mg qhs, memantine 10 mg 10 mg twice a day - on Gabapentin 600 mg TID , primidone for tremor - on Depakote 500 mg BID for reportedly sleep disorder - on pramipexole - on hydrocodone     The patient demonstrates the following risk factors for suicide: Chronic risk factors for suicide include: psychiatric disorder of depression. Acute risk factors for suicide include: N/A. Protective factors for this patient include: positive social support, responsibility to others (children, family), coping skills and hope for the future. Considering  these factors, the overall suicide risk at this point appears to be low. Patient is appropriate for outpatient follow up.  Collaboration of Care: Collaboration of Care: Other reviewed notes in [facility]  Patient/Guardian was advised Release of Information must be obtained prior to any record release in order to collaborate their care with an outside provider. Patient/Guardian was advised if they have not already done so to contact the registration department to sign all necessary forms in order for [place] to release information regarding their care.   Consent: Patient/Guardian gives verbal consent for treatment and assignment of benefits for services provided during this visit. Patient/Guardian expressed understanding and agreed to proceed.    [name], MD 10/08/2022, 8:37 AM

## 2022-10-08 ENCOUNTER — Ambulatory Visit: Payer: PPO | Admitting: Psychiatry

## 2022-10-08 ENCOUNTER — Telehealth: Payer: Self-pay

## 2022-10-08 ENCOUNTER — Other Ambulatory Visit (INDEPENDENT_AMBULATORY_CARE_PROVIDER_SITE_OTHER): Payer: PPO

## 2022-10-08 ENCOUNTER — Encounter: Payer: Self-pay | Admitting: Psychiatry

## 2022-10-08 VITALS — BP 118/76 | HR 96 | Temp 97.8°F | Ht 70.0 in | Wt 159.4 lb

## 2022-10-08 DIAGNOSIS — I10 Essential (primary) hypertension: Secondary | ICD-10-CM | POA: Diagnosis not present

## 2022-10-08 DIAGNOSIS — E782 Mixed hyperlipidemia: Secondary | ICD-10-CM | POA: Diagnosis not present

## 2022-10-08 DIAGNOSIS — G4700 Insomnia, unspecified: Secondary | ICD-10-CM | POA: Diagnosis not present

## 2022-10-08 DIAGNOSIS — F330 Major depressive disorder, recurrent, mild: Secondary | ICD-10-CM

## 2022-10-08 NOTE — Telephone Encounter (Signed)
Copied from CRM [id number]. Topic: General - Inquiry >> Oct 08, 2022 12:44 PM Pincus Sanes wrote: Reason for CRM: This pt states Dr Ashley Royalty scheduled him an appt for a CT scan on the 30th at [facility] and he has no idea why and would not want to go to [facility] but somewhere local, most important does not know why. FU ato advise [phone]

## 2022-10-08 NOTE — Patient Instructions (Signed)
Continue bupropion XR 300 mg daily  Continue duloxetine  Continue Trazodone 200 mg at night as needed for insomnia  Next appointment: 7/23 at 8 30

## 2022-10-08 NOTE — Telephone Encounter (Signed)
Spoke with pt

## 2022-10-09 LAB — BASIC METABOLIC PANEL
BUN/Creatinine Ratio: 23 (ref 10–24)
BUN: 24 mg/dL (ref 8–27)
CO2: 23 mmol/L (ref 20–29)
Calcium: 9.7 mg/dL (ref 8.6–10.2)
Chloride: 97 mmol/L (ref 96–106)
Creatinine, Ser: 1.04 mg/dL (ref 0.76–1.27)
Glucose: 151 mg/dL — ABNORMAL HIGH (ref 70–99)
Potassium: 4.8 mmol/L (ref 3.5–5.2)
Sodium: 136 mmol/L (ref 134–144)
eGFR: 72 mL/min/{1.73_m2} (ref >59)

## 2022-10-15 ENCOUNTER — Other Ambulatory Visit: Payer: Self-pay | Admitting: Family Medicine

## 2022-10-15 DIAGNOSIS — G2581 Restless legs syndrome: Secondary | ICD-10-CM

## 2022-10-18 ENCOUNTER — Ambulatory Visit (HOSPITAL_COMMUNITY)
Admission: RE | Admit: 2022-10-18 | Discharge: 2022-10-18 | Disposition: A | Discharge status: Home / Self Care | Payer: PPO | Source: Ambulatory Visit | Attending: Family Medicine | Admitting: Family Medicine

## 2022-10-18 DIAGNOSIS — M21769 Unequal limb length (acquired), unspecified tibia and fibula: Secondary | ICD-10-CM | POA: Diagnosis not present

## 2022-10-18 DIAGNOSIS — M19072 Primary osteoarthritis, left ankle and foot: Secondary | ICD-10-CM | POA: Diagnosis not present

## 2022-10-22 ENCOUNTER — Ambulatory Visit (INDEPENDENT_AMBULATORY_CARE_PROVIDER_SITE_OTHER): Payer: PPO | Admitting: Licensed Clinical Social Worker

## 2022-10-22 ENCOUNTER — Telehealth: Payer: Self-pay | Admitting: Family Medicine

## 2022-10-22 DIAGNOSIS — F330 Major depressive disorder, recurrent, mild: Secondary | ICD-10-CM

## 2022-10-22 DIAGNOSIS — F411 Generalized anxiety disorder: Secondary | ICD-10-CM | POA: Diagnosis not present

## 2022-10-22 NOTE — Telephone Encounter (Signed)
The patient called in checking on the status of his CT scan from 05/30. He is anxious to know results and go over those with his provider. Please assist patient further

## 2022-10-22 NOTE — Progress Notes (Signed)
Virtual Visit via Video Note  I connected with James Hood on 10/22/22 at  8:00 AM EDT by a video enabled telemedicine application and verified that I am speaking with the correct person using two identifiers.  Location: Patient: home Provider: remote office [place], [place])   I discussed the limitations of evaluation and management by telemedicine and the availability of in person appointments. The patient expressed understanding and agreed to proceed.   I discussed the assessment and treatment plan with the patient. The patient was provided an opportunity to ask questions and all were answered. The patient agreed with the plan and demonstrated an understanding of the instructions.   The patient was advised to call back or seek an in-person evaluation if the symptoms worsen or if the condition fails to improve as anticipated.  I provided 43 minutes of non-face-to-face time during this encounter.   Nurah Petrides R Hazely Sealey, LCSW   THERAPIST PROGRESS NOTE  Session Time: [phone]  Participation Level: Active  Behavioral Response: Neat and Well GroomedAlertAnxious  Type of Therapy: Individual Therapy  Treatment Goals addressed:LTG: Reduce frequency, intensity, and duration of depression symptoms as evidenced by: pt self report  ProgressTowards Goals: Not Progressing  Interventions: CBT, Motivational Interviewing, and Supportive  Summary: James Hood is a 82 y.o. male who presents with continuing symptoms related to depression. Pt reports that he continues to feel low energy and reduced strength. Pt states he will discuss this with his PCP at next visit. Pt reports 10 lb weight loss.   Pt states his doctor has put him on a different medication for anxiety that is helping a lot.   Clinician assisted pt with identifying situations/scenarios/schemas triggering anxiety and/or depression symptoms. Allowed pt to explore and express thoughts and feelings and discussed current coping  mechanisms. Reviewed changes/recommendations.  Discussed recent purchase of camper--pt states that he has sold his home and the closing for the home will be in one month. Pt states that the camper will be moved to a part of land that his daughter owns and that there will be a wood fence built around the camper for their privacy. Allowed pt to identify pros and cons of this decision and opinions/thoughts/feelings of his wife.   Pt feels his wife is not happy to be making the change but pt states that financially t Health: camper in driveway in [place]. Moving to snow camp. SIL will have to back camper through fence/gate. House closing on July 1. Wants to dig up plants out of the garden. Appt with dr. Thurmond Butts a lot of weight. Arms thin--no muscle strength. Lost 10 lbs. Stress induced. Change diet to gain weight.  Still Smoking. Hate to go to the gym alone. Gym in the garage--may start using that. [place].  Pt reports ongoing medical concerns including continuing pain in his ankle. Pt states he has upcoming appointment w/ surgeon [facility]..Clinician and patient continued to discuss problem solving regarding complex medical history, implementation and practice of relaxation activities and self-care, addressing impact of current medications, and encouraging patient to communicate thoughts and feelings to medical professionals, and anyone else in his inner circle  Continued recommendations are as follows: self care behaviors, positive social engagements, focusing on overall work/home/life balance, and focusing on positive physical and emotional wellness.  Suicidal/Homicidal: No  Therapist Response: Pt is continuing to apply interventions learned in session into daily life situations. Pt is currently on track to meet goals utilizing interventions mentioned above. Personal growth and progress noted.  Treatment to continue as indicated.   Plan: Return again in 4 weeks.  Diagnosis:  Encounter  Diagnoses  Name Primary?   MDD (major depressive disorder), recurrent episode, mild (HCC) Yes   Generalized anxiety disorder    Collaboration of Care: Other Pt encouraged to continue psychiatric care with Dr. [name]  Patient/Guardian was advised Release of Information must be obtained prior to any record release in order to collaborate their care with an outside provider. Patient/Guardian was advised if they have not already done so to contact the registration department to sign all necessary forms in order for [place] to release information regarding their care.   Consent: Patient/Guardian gives verbal consent for treatment and assignment of benefits for services provided during this visit. Patient/Guardian expressed understanding and agreed to proceed.   Ernest Haber Nandana Krolikowski, LCSW 10/22/2022

## 2022-10-22 NOTE — Telephone Encounter (Signed)
Please adviser

## 2022-10-23 DIAGNOSIS — M25572 Pain in left ankle and joints of left foot: Secondary | ICD-10-CM | POA: Diagnosis not present

## 2022-10-23 DIAGNOSIS — G479 Sleep disorder, unspecified: Secondary | ICD-10-CM | POA: Diagnosis not present

## 2022-10-23 DIAGNOSIS — G2581 Restless legs syndrome: Secondary | ICD-10-CM | POA: Diagnosis not present

## 2022-10-23 DIAGNOSIS — R413 Other amnesia: Secondary | ICD-10-CM | POA: Diagnosis not present

## 2022-10-23 DIAGNOSIS — R262 Difficulty in walking, not elsewhere classified: Secondary | ICD-10-CM | POA: Diagnosis not present

## 2022-10-23 NOTE — Telephone Encounter (Signed)
Results posted

## 2022-10-24 ENCOUNTER — Encounter: Payer: Self-pay | Admitting: Family Medicine

## 2022-10-24 ENCOUNTER — Ambulatory Visit (INDEPENDENT_AMBULATORY_CARE_PROVIDER_SITE_OTHER): Payer: PPO | Admitting: Family Medicine

## 2022-10-24 VITALS — BP 124/70 | HR 77 | Temp 97.9°F | Ht 70.0 in | Wt 161.6 lb

## 2022-10-24 DIAGNOSIS — R634 Abnormal weight loss: Secondary | ICD-10-CM | POA: Diagnosis not present

## 2022-10-24 DIAGNOSIS — I10 Essential (primary) hypertension: Secondary | ICD-10-CM

## 2022-10-24 DIAGNOSIS — Z7984 Long term (current) use of oral hypoglycemic drugs: Secondary | ICD-10-CM | POA: Diagnosis not present

## 2022-10-24 DIAGNOSIS — E1142 Type 2 diabetes mellitus with diabetic polyneuropathy: Secondary | ICD-10-CM

## 2022-10-24 DIAGNOSIS — F32A Depression, unspecified: Secondary | ICD-10-CM

## 2022-10-24 DIAGNOSIS — E785 Hyperlipidemia, unspecified: Secondary | ICD-10-CM

## 2022-10-24 DIAGNOSIS — F419 Anxiety disorder, unspecified: Secondary | ICD-10-CM | POA: Diagnosis not present

## 2022-10-24 NOTE — Patient Instructions (Signed)
Nice to see you. I will see what your psychiatrist says about your medications and let you know.  Please try to get in to the gym to exercise some.

## 2022-10-26 NOTE — Assessment & Plan Note (Signed)
Chronic issue.  Worsened recently.  Discussed that I would like to get his psychiatrist to weigh in on medication changes.  I will send her a message to see what she would suggest to help with his current symptoms.  For now he will remain on Cymbalta 60 mg daily and continue his other medicines managed by his psychiatrist.

## 2022-10-26 NOTE — Progress Notes (Signed)
[name], MD Phone: [phone]  James Hood is a 82 y.o. male who presents today for f/u.  Diabetes: Patient is 85% in target range, 7% below, and 8% above target range over the last 90 days.  He continues on [place], Farxiga, and metformin.  Notes he is not eating as healthfully as he could.  He eats out a lot and oftentimes will skip lunch because he is busy.  Anxiety: Patient does not feel as though the Cymbalta has been very helpful.  He was started on this by Dr. Ashley Royalty.  This has not helped much with his pain or his anxiety.  He does report depression as well.  He is on Wellbutrin, Depakote, and Seroquel.  He notes no SI.  He has a lot of stress in his life.  They are moving into a camper from a 2200 square foot house.  He has had more arguments with his wife recently.  He is also not sure what to do to help his wife with her medical issues.  Weight loss: Patient notes he has progressively lost weight over a period of time.  He is not really able to exercise given his chronic ankle issues.  He is seeing an ankle surgeon in the near future.   Social History   Tobacco Use  Smoking Status Every Day   Packs/day: 0.50   Years: 60.00   Additional pack years: 0.00   Total pack years: 30.00   Types: Cigarettes  Smokeless Tobacco Never    Current Outpatient Medications on File Prior to Visit  Medication Sig Dispense Refill   aspirin EC 81 MG tablet Take 81 mg by mouth daily.     buPROPion (WELLBUTRIN XL) 300 MG 24 hr tablet Take 1 tablet (300 mg total) by mouth daily. 90 tablet 1   cholestyramine (QUESTRAN) 4 g packet Take 1 packet by mouth 2 (two) times daily.     ciclopirox (PENLAC) 8 % solution Apply topically at bedtime. Apply over nail and surrounding skin. Apply daily over previous coat. After seven (7) days, may remove with alcohol and continue cycle. 6.6 mL 0   Continuous Blood Gluc Sensor (FREESTYLE LIBRE 14 DAY SENSOR) MISC APPLY 1 SENSOR EVERY 14 DAYS TO  MONITOR BLOOD GLUCOSE LEVELS. REMOVE OLD SENSOR BEFORE APPLYING NEW SENSOR 2 each 1   Continuous Blood Gluc Sensor (FREESTYLE LIBRE 14 DAY SENSOR) MISC APPLY 1 SENSOR EVERY 14 DAYS TO MONITOR BLOOD GLUCOSE LEVELS. REMOVE OLD SENSOR BEFORE APPLYING NEW SENSOR 2 each 5   dapagliflozin propanediol (FARXIGA) 10 MG TABS tablet Take 1 tablet (10 mg total) by mouth daily. 90 tablet 3   diphenoxylate-atropine (LOMOTIL) 2.5-0.025 MG tablet Take by mouth.     divalproex (DEPAKOTE ER) 500 MG 24 hr tablet Take 1 tablet by mouth 2 (two) times daily.     donepezil (ARICEPT) 10 MG tablet Take 10 mg by mouth at bedtime.     DULoxetine (CYMBALTA) 30 MG capsule Take 1 capsule (30 mg total) by mouth every evening for 7 days, THEN 2 capsules (60 mg total) every evening. 73 capsule 0   fenofibrate (TRICOR) 145 MG tablet TAKE 1 TABLET BY MOUTH DAILY 90 tablet 1   gabapentin (NEURONTIN) 600 MG tablet TAKE TWO TABLETS BY MOUTH THREE TIMES A DAY 180 tablet 1   lidocaine (LIDODERM) 5 % Place 1 patch onto the skin every 12 (twelve) hours. Remove & Discard patch within 12 hours or as directed by MD 30 patch 2  lisinopril-hydrochlorothiazide (ZESTORETIC) 20-25 MG tablet TAKE 1 TABLET BY MOUTH TWICE A DAY 180 tablet 1   memantine (NAMENDA) 10 MG tablet Take 1 tablet by mouth daily.     metFORMIN (GLUCOPHAGE-XR) 500 MG 24 hr tablet TAKE 1 TABLET BY MOUTH TWICE A DAY 180 tablet 1   metoprolol succinate (TOPROL-XL) 25 MG 24 hr tablet Take 25 mg by mouth daily.     Multiple Vitamins-Minerals (CENTRUM SILVER PO) Take 1 tablet by mouth daily.     omega-3 acid ethyl esters (LOVAZA) 1 g capsule TAKE TWO CAPSULES BY MOUTH TWICE A DAY 360 capsule 3   omeprazole (PRILOSEC) 20 MG capsule Take 20 mg by mouth daily.     oxyCODONE-acetaminophen (PERCOCET) 10-325 MG tablet Take 1 tablet by mouth every 6 (six) hours as needed for pain. Must last 30 days. 120 tablet 0   [START ON 10/27/2022] oxyCODONE-acetaminophen (PERCOCET) 10-325 MG tablet  Take 1 tablet by mouth every 6 (six) hours as needed for pain. Must last 30 days. 120 tablet 0   pantoprazole (PROTONIX) 40 MG tablet Take 40 mg by mouth daily.     pramipexole (MIRAPEX) 0.125 MG tablet TAKE 1 TABLET BY MOUTH EVERY NIGHT 2-3 HOURS BEFORE BEDTIME 90 tablet 1   primidone (MYSOLINE) 50 MG tablet Take 150-250 mg by mouth 2 (two) times daily. Take 250 mg by mouth in the morning & take 150 mg at night.     QUEtiapine (SEROQUEL) 25 MG tablet Take 75 mg by mouth at bedtime.     rosuvastatin (CRESTOR) 40 MG tablet TAKE ONE TABLET BY MOUTH DAILY 90 tablet 3   Semaglutide, 1 MG/DOSE, (OZEMPIC, 1 MG/DOSE,) 4 MG/3ML SOPN Inject 1 mg into the skin once a week. 3 mL 3   tamsulosin (FLOMAX) 0.4 MG CAPS capsule TAKE 2 CAPSULES BY MOUTH DAILY AFTER SUPPER 180 capsule 0   traZODone (DESYREL) 100 MG tablet Take 2 tablets (200 mg total) by mouth at bedtime as needed for sleep. 180 tablet 0   vitamin C (ASCORBIC ACID) 500 MG tablet Take 1,000 mg by mouth daily.     No current facility-administered medications on file prior to visit.     ROS see history of present illness  Objective  Physical Exam Vitals:   10/24/22 1628  BP: 124/70  Pulse: 77  Temp: 97.9 F (36.6 C)  SpO2: 95%    BP Readings from Last 3 Encounters:  10/24/22 124/70  09/28/22 128/78  08/28/22 (!) 152/73   Wt Readings from Last 3 Encounters:  10/24/22 161 lb 9.6 oz (73.3 kg)  09/28/22 165 lb (74.8 kg)  08/28/22 165 lb (74.8 kg)    Physical Exam Constitutional:      General: He is not in acute distress.    Appearance: He is not diaphoretic.  Cardiovascular:     Rate and Rhythm: Normal rate and regular rhythm.     Heart sounds: Normal heart sounds.  Pulmonary:     Effort: Pulmonary effort is normal.     Breath sounds: Normal breath sounds.  Skin:    General: Skin is warm and dry.  Neurological:     Mental Status: He is alert.      Assessment/Plan: Please see individual problem list.  Type 2 diabetes  mellitus with diabetic polyneuropathy, without long-term current use of insulin (HCC) Assessment & Plan: Chronic issue.  Most recent A1c adequately controlled.  He will continue Ozempic 0.75 mg weekly.  He will also stay on Farxiga 10 mg  daily and metformin 500 mg twice daily.  Discussed if he continues to have some weight loss we may need to consider stopping the Ozempic altogether.   Anxiety and depression Assessment & Plan: Chronic issue.  Worsened recently.  Discussed that I would like to get his psychiatrist to weigh in on medication changes.  I will send her a message to see what she would suggest to help with his current symptoms.  For now he will remain on Cymbalta 60 mg daily and continue his other medicines managed by his psychiatrist.   Weight loss Assessment & Plan: Patient has had chronic progressive weight loss.  Recently has rebounded some.  Discussed that this could be related to his Ozempic.  I am going to see him back in 1 month to see if his weight has stabilized but if it continues to trend down we may need to discontinue the Ozempic.    Return in about 1 month (around 11/23/2022) for weight.   [name], MD [facility]

## 2022-10-26 NOTE — Assessment & Plan Note (Signed)
Patient has had chronic progressive weight loss.  Recently has rebounded some.  Discussed that this could be related to his Ozempic.  I am going to see him back in 1 month to see if his weight has stabilized but if it continues to trend down we may need to discontinue the Ozempic.

## 2022-10-26 NOTE — Assessment & Plan Note (Signed)
Chronic issue.  Most recent A1c adequately controlled.  He will continue Ozempic 0.75 mg weekly.  He will also stay on Farxiga 10 mg daily and metformin 500 mg twice daily.  Discussed if he continues to have some weight loss we may need to consider stopping the Ozempic altogether.

## 2022-10-29 ENCOUNTER — Telehealth: Payer: Self-pay | Admitting: Family Medicine

## 2022-10-29 NOTE — Telephone Encounter (Signed)
Please let the patient know that I heard back from his psychiatrist. They noted he had just increased the dose of the cymbalta on 5/20 so it is too early to see effectiveness. They advised seeing how he does with being on the 60 mg dose for a total of six weeks and if there is not any benefit at that time we could consider increasing the buproprion.

## 2022-10-29 NOTE — Telephone Encounter (Signed)
-----   Message from James Hotter, MD sent at 10/28/2022  9:12 AM EDT ----- Hi James. Birdie Sons,   Thank you for reaching out. I believe he just increased the dose on 5/20, so it is a little too early to see the effectiveness. If there is no improvement in six weeks or so, I might consider uptitration of bupropion, after ensuring he has not experienced any worsening of anxiety from the medication, as it can worsen anxiety in some patients. This would help maximize the benefit. I would also like to check TSH since it hasn't been done in the past year. I can either handle it myself or defer it to you. I'm fine with either option. Please let me know if you have any additional questions.  Thanks,  James Hood  ----- Message ----- From: James Luis, MD Sent: 10/26/2022   1:17 PM EDT To: James Hotter, MD  Hi James Hood, I saw James Hood for follow-up this week. He notes his anxiety and depression has worsened recently with some of his life changes. It looks like you may have discussed this with him during his last visit with you. At that time he had just started on cymbalta through James Ashley Royalty and he wanted to continue on that to see if that was helpful. He has not noted any benefit from the cymbalta and is interested in other options. I wanted to get your input on the next steps for his anxiety and depression. Thanks.   James Hood

## 2022-10-30 ENCOUNTER — Other Ambulatory Visit: Payer: Self-pay | Admitting: Family Medicine

## 2022-10-30 DIAGNOSIS — G8929 Other chronic pain: Secondary | ICD-10-CM

## 2022-10-30 DIAGNOSIS — M19072 Primary osteoarthritis, left ankle and foot: Secondary | ICD-10-CM

## 2022-10-31 NOTE — Telephone Encounter (Signed)
Pt has been informed no questions at this time

## 2022-10-31 NOTE — Telephone Encounter (Signed)
Pt no longer under prescribers care   Requested Prescriptions  Refused Prescriptions Disp Refills   DULoxetine (CYMBALTA) 30 MG capsule [Pharmacy Med Name: DULoxetine HCL DR 30 MG CAPSULE] 73 capsule 0    Sig: TAKE ONE CAPSULE BY MOUTH EVERY EVENING FOR 7 DAYS, THEN TAKE TWO CAPSULES BY MOUTH EVERY EVENING     Psychiatry: Antidepressants - SNRI - duloxetine Passed - 10/30/2022  6:21 AM      Passed - Cr in normal range and within 360 days    Creatinine, Ser  Date Value Ref Range Status  10/08/2022 1.04 0.76 - 1.27 mg/dL Final   Creatinine,U  Date Value Ref Range Status  08/28/2022 32.8 mg/dL Final         Passed - eGFR is 30 or above and within 360 days    GFR calc Af Amer  Date Value Ref Range Status  09/25/2019 >60 >60 mL/min Final   GFR, Estimated  Date Value Ref Range Status  06/08/2020 55 (L) >60 mL/min Final    Comment:    (NOTE) Calculated using the CKD-EPI Creatinine Equation (2021)    GFR  Date Value Ref Range Status  08/27/2022 44.86 (L) >60.00 mL/min Final    Comment:    Calculated using the CKD-EPI Creatinine Equation (2021)   eGFR  Date Value Ref Range Status  10/08/2022 72 >59 mL/min/1.73 Final         Passed - Completed PHQ-2 or PHQ-9 in the last 360 days      Passed - Last BP in normal range    BP Readings from Last 1 Encounters:  10/24/22 124/70         Passed - Valid encounter within last 6 months    Recent Outpatient Visits           1 month ago Primary osteoarthritis of left ankle   [facility] Royalty, Ocie Bob, MD   2 months ago Primary osteoarthritis of left ankle   [facility], Ocie Bob, MD   2 months ago Primary osteoarthritis of left ankle   [facility] at [facility], Ocie Bob, MD   3 months ago Primary osteoarthritis of left ankle   [facility] at  [facility], Ocie Bob, MD   3 months ago Osteochondral defect of ankle   [facility] at [facility], Ocie Bob, MD       Future Appointments             In 1 month Birdie Sons, Yehuda Mao, MD [facility] at [place], [facility]   In 3 months Birdie Sons, Yehuda Mao, MD [facility] at [place], [facility]

## 2022-11-03 ENCOUNTER — Other Ambulatory Visit: Payer: Self-pay | Admitting: Family Medicine

## 2022-11-05 ENCOUNTER — Other Ambulatory Visit: Payer: Self-pay | Admitting: Family Medicine

## 2022-11-05 DIAGNOSIS — M19072 Primary osteoarthritis, left ankle and foot: Secondary | ICD-10-CM

## 2022-11-05 DIAGNOSIS — E782 Mixed hyperlipidemia: Secondary | ICD-10-CM

## 2022-11-05 DIAGNOSIS — M7918 Myalgia, other site: Secondary | ICD-10-CM

## 2022-11-05 NOTE — Telephone Encounter (Signed)
Medication Refill - Medication: DULoxetine (CYMBALTA) 30 MG capsule  Has the patient contacted their pharmacy? Yes.    Preferred Pharmacy (with phone number or street name):  [id number] [place], [place] - [address] Phone: [phone]  Fax: [phone]     Has the patient been seen for an appointment in the last year OR does the patient have an upcoming appointment? Yes.    Agent: Please be advised that RX refills may take up to 3 business days. We ask that you follow-up with your pharmacy.

## 2022-11-06 ENCOUNTER — Telehealth: Payer: Self-pay | Admitting: Family Medicine

## 2022-11-06 NOTE — Telephone Encounter (Signed)
Requested medication (s) are due for refill today: routing for review  Requested medication (s) are on the active medication list: yes  Last refill:  09/28/22  Future visit scheduled: no  Notes to clinic:  Routing for approval, medication recently refused. Patient sees sports provider for ankle injury.     Requested Prescriptions  Pending Prescriptions Disp Refills   DULoxetine (CYMBALTA) 30 MG capsule 73 capsule 0    Sig: Take 1 capsule (30 mg total) by mouth every evening for 7 days, THEN 2 capsules (60 mg total) every evening.     Psychiatry: Antidepressants - SNRI - duloxetine Passed - 11/05/2022 11:22 AM      Passed - Cr in normal range and within 360 days    Creatinine, Ser  Date Value Ref Range Status  10/08/2022 1.04 0.76 - 1.27 mg/dL Final   Creatinine,U  Date Value Ref Range Status  08/28/2022 32.8 mg/dL Final         Passed - eGFR is 30 or above and within 360 days    GFR calc Af Amer  Date Value Ref Range Status  09/25/2019 >60 >60 mL/min Final   GFR, Estimated  Date Value Ref Range Status  06/08/2020 55 (L) >60 mL/min Final    Comment:    (NOTE) Calculated using the CKD-EPI Creatinine Equation (2021)    GFR  Date Value Ref Range Status  08/27/2022 44.86 (L) >60.00 mL/min Final    Comment:    Calculated using the CKD-EPI Creatinine Equation (2021)   eGFR  Date Value Ref Range Status  10/08/2022 72 >59 mL/min/1.73 Final         Passed - Completed PHQ-2 or PHQ-9 in the last 360 days      Passed - Last BP in normal range    BP Readings from Last 1 Encounters:  10/24/22 124/70         Passed - Valid encounter within last 6 months    Recent Outpatient Visits           1 month ago Primary osteoarthritis of left ankle   [place] [facility], Ocie Bob, MD   2 months ago Primary osteoarthritis of left ankle   [facility], Ocie Bob, MD   3  months ago Primary osteoarthritis of left ankle   [facility] at [facility], Ocie Bob, MD   3 months ago Primary osteoarthritis of left ankle   [facility] at [facility], Ocie Bob, MD   4 months ago Osteochondral defect of ankle   [facility] & [facility] Medicine at [facility], Ocie Bob, MD       Future Appointments             In 4 weeks Birdie Sons, Yehuda Mao, MD [facility] at [place], [facility]   In 3 months Birdie Sons, Yehuda Mao, MD [facility] at [place], [facility]

## 2022-11-06 NOTE — Telephone Encounter (Signed)
Please review.  KP

## 2022-11-06 NOTE — Telephone Encounter (Signed)
Copied from CRM [id number]. Topic: Appointment Scheduling - Scheduling Inquiry for Clinic >> Nov 06, 2022  9:34 AM James Hood wrote: Patient would like to come in for a cortisone shot in left ankle. Please advise.

## 2022-11-06 NOTE — Telephone Encounter (Signed)
LVM to call back, pt just had injection 5 weeks ago, unable to do so for 6 more weeks, pt needs to follow up with ortho

## 2022-11-07 MED ORDER — DULOXETINE HCL 30 MG PO CPEP
ORAL_CAPSULE | ORAL | 0 refills | Status: DC
Start: 2022-11-07 — End: 2022-12-11

## 2022-11-19 ENCOUNTER — Other Ambulatory Visit: Payer: Self-pay | Admitting: Psychiatry

## 2022-11-20 ENCOUNTER — Other Ambulatory Visit: Payer: Self-pay | Admitting: Psychiatry

## 2022-11-21 DIAGNOSIS — M25511 Pain in right shoulder: Secondary | ICD-10-CM | POA: Diagnosis not present

## 2022-11-26 ENCOUNTER — Telehealth: Payer: Self-pay

## 2022-11-26 ENCOUNTER — Other Ambulatory Visit: Payer: Self-pay | Admitting: Psychiatry

## 2022-11-26 MED ORDER — TRAZODONE HCL 100 MG PO TABS: 200.0000 mg | ORAL_TABLET | Freq: Every evening | ORAL | 0 refills | Status: DC | PRN

## 2022-11-26 NOTE — Telephone Encounter (Signed)
received fax requesting a refill on the trazodone 100mg  pt was last seen on 5-20 next appt 7-23

## 2022-11-26 NOTE — Telephone Encounter (Signed)
left message that rx was sent to the pharmacy  

## 2022-11-26 NOTE — Telephone Encounter (Signed)
Ordered

## 2022-11-27 DIAGNOSIS — M5416 Radiculopathy, lumbar region: Secondary | ICD-10-CM | POA: Diagnosis not present

## 2022-11-27 DIAGNOSIS — M19072 Primary osteoarthritis, left ankle and foot: Secondary | ICD-10-CM | POA: Diagnosis not present

## 2022-11-28 ENCOUNTER — Ambulatory Visit
Payer: PPO | Attending: Student in an Organized Health Care Education/Training Program | Admitting: Student in an Organized Health Care Education/Training Program

## 2022-11-28 DIAGNOSIS — M5417 Radiculopathy, lumbosacral region: Secondary | ICD-10-CM

## 2022-11-28 DIAGNOSIS — M958 Other specified acquired deformities of musculoskeletal system: Secondary | ICD-10-CM

## 2022-11-28 DIAGNOSIS — M47816 Spondylosis without myelopathy or radiculopathy, lumbar region: Secondary | ICD-10-CM | POA: Diagnosis not present

## 2022-11-28 DIAGNOSIS — G894 Chronic pain syndrome: Secondary | ICD-10-CM

## 2022-11-28 DIAGNOSIS — M25572 Pain in left ankle and joints of left foot: Secondary | ICD-10-CM

## 2022-11-28 DIAGNOSIS — M19072 Primary osteoarthritis, left ankle and foot: Secondary | ICD-10-CM | POA: Diagnosis not present

## 2022-11-28 DIAGNOSIS — G8929 Other chronic pain: Secondary | ICD-10-CM

## 2022-11-28 MED ORDER — OXYCODONE-ACETAMINOPHEN 10-325 MG PO TABS
1.0000 | ORAL_TABLET | Freq: Four times a day (QID) | ORAL | 0 refills | Status: DC | PRN
Start: 2022-11-28 — End: 2022-12-20

## 2022-11-28 NOTE — Progress Notes (Signed)
Patient: James Hood  Service Category: E/M  Provider: Edward Jolly, MD  DOB: 03-10-1941  DOS: 11/28/2022  Location: Office  MRN: [id number]  Setting: Ambulatory outpatient  Referring Provider: Glori Luis, MD  Type: Established Patient  Specialty: Interventional Pain Management  PCP: Glori Luis, MD  Location: Remote location  Delivery: TeleHealth     Virtual Encounter - Pain Management PROVIDER NOTE: Information contained herein reflects review and annotations entered in association with encounter. Interpretation of such information and data should be left to medically-trained personnel. Information provided to patient can be located elsewhere in the medical record under "Patient Instructions". Document created using STT-dictation technology, any transcriptional errors that may result from process are unintentional.    Contact & Pharmacy Preferred: [phone] Home: [phone] (home) Mobile: [phone] (mobile) E-mail: [email or web address]  [facility] [id number] [place], [place] - [address] [place] [address] Phone: [phone] Fax: [phone]  [id number] [place],  - [address] AT [facility] [address] [place] [address] Phone: [phone] Fax: [phone]  [facility] [address], [place] - [address] [place] [address] Phone: [phone] Fax: [phone]  [facility] - [place], [place] - [address] [place] [address] Phone: [phone] Fax: [phone]  [facility] - [place], [place] - [address] [place] [address] Phone: [phone] Fax: [phone]   Pre-screening  James Hood offered "in-person" vs "virtual" encounter. He indicated preferring virtual for this encounter.   Reason COVID-19*  Social distancing based on CDC and AMA  recommendations.   I contacted James Hood on 11/28/2022 via telephone.      I clearly identified myself as Edward Jolly, MD. I verified that I was speaking with the correct person using two identifiers (Name: James Hood, and date of birth: 1941/02/06).  Consent I sought verbal advanced consent from James Hood for virtual visit interactions. I informed James Hood of possible security and privacy concerns, risks, and limitations associated with providing "not-in-person" medical evaluation and management services. I also informed James Hood of the availability of "in-person" appointments. Finally, I informed him that there would be a charge for the virtual visit and that he could be  personally, fully or partially, financially responsible for it. James Hood expressed understanding and agreed to proceed.   Historic Elements   James Hood is a 82 y.o. year old, male patient evaluated today after our last contact on 08/28/2022. 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 James Hood has a current medication list which includes  the following prescription(s): aspirin ec, bupropion, cholestyramine, ciclopirox, freestyle libre 14 day sensor, freestyle libre 14 day sensor, dapagliflozin propanediol, diphenoxylate-atropine, divalproex, donepezil, duloxetine, fenofibrate, gabapentin, lidocaine, lisinopril-hydrochlorothiazide, memantine, metformin, metoprolol succinate, multiple vitamins-minerals, omega-3 acid ethyl esters, omeprazole, oxycodone-acetaminophen, pantoprazole, pramipexole, primidone, quetiapine, rosuvastatin, ozempic (1 mg/dose), tamsulosin, [START ON 12/02/2022] trazodone, and ascorbic acid. He  reports that he has been smoking cigarettes. He has a 30.00 pack-year smoking history. He has never used smokeless tobacco. He reports that he does not currently use alcohol. He reports that he does not use drugs. James Hood has No Known Allergies.  BMI: Estimated body mass index is 23.19 kg/m as calculated from the following:   Height as of 10/24/22: 5\' 10"  (1.778 m).   Weight as of 10/24/22: 161 lb 9.6 oz (73.3 kg). Last encounter: 08/28/2022. Last procedure: Visit date not found.  HPI  Today, he is being contacted for medication management.  Patient called the clinic today to let us know that he is out of his medications and does not have a follow-up appointment scheduled.  I am out of town until next Tuesday and the patient is out of his medications.  For this reason, we are doing a virtual visit for 1 month of medication supply only.  No change in dose.  Refill as below.  No change in medical history since last visit.  Patient's pain is at baseline.  Patient continues multimodal pain regimen as prescribed.  States that it provides pain relief and improvement in functional status.   Pharmacotherapy Assessment    Opioid Analgesic:  Oxycodone 10 mg every 6 hours as needed, quantity 120/month; MME equals 60    Monitoring: [place] PMP: PDMP reviewed during this encounter.       Pharmacotherapy: No side-effects or adverse reactions reported. Compliance: No problems identified. Effectiveness: Clinically acceptable. Plan: Refer to "POC". UDS:  Summary  Date Value Ref Range Status  11/28/2021 Note  Final    Comment:    ==================================================================== [employer] Select 13 (MW) ==================================================================== Test                             Result       Flag       Units  Drug Present and Declared for Prescription Verification   Oxycodone                      176          EXPECTED   ng/mg creat   Oxymorphone                    732          EXPECTED   ng/mg creat   Noroxycodone                   1900         EXPECTED   ng/mg creat   Noroxymorphone                 585          EXPECTED   ng/mg creat    Sources of oxycodone are scheduled prescription medications.    Oxymorphone, noroxycodone, and noroxymorphone are expected    metabolites of oxycodone. Oxymorphone is also available as a    scheduled prescription medication.    Phenobarbital  PRESENT      EXPECTED    Phenobarbital is an expected metabolite of primidone; Phenobarbital    may also be administered as a prescription drug.  Drug Absent but Declared for Prescription Verification   Hydromorphone                  Not Detected UNEXPECTED ng/mg creat ==================================================================== Test                      Result    Flag   Units      Ref Range   Creatinine              34               mg/dL      >=86 ==================================================================== Declared Medications:  The flagging and interpretation on this report are based on the  following declared medications.  Unexpected results may arise  from  inaccuracies in the declared medications.   **Note: The testing scope of this panel includes these medications:   Hydromorphone (Dilaudid)  Oxycodone (Percocet)  Primidone (Mysoline)   **Note: The testing scope of this panel does not include the  following reported medications:   Acetaminophen (Percocet)  Aspirin  Atropine (Lomotil)  Budesonide (Entocort)  Bupropion (Wellbutrin XL)  Cholestyramine (Questran)  Dapagliflozin (Farxiga)  Diphenoxylate (Lomotil)  Divaleproex (Depakote)  Donepezil (Aricept)  Doxycycline  Fenofibrate (TriCor)  Gabapentin (Neurontin)  Hydrochlorothiazide (Zestoretic)  Iron  Lisinopril (Zestoretic)  Memantine (Namenda)  Metformin (Glucophage)  Metoprolol (Toprol)  Multivitamin  Omega-3 Fatty Acids  Omeprazole (Prilosec)  Pramipexole (Mirapex)  Promethazine  Quetiapine (Seroquel)  Rosuvastatin (Crestor)  Semaglutide (Ozempic)  Tamsulosin (Flomax)  Trazodone (Desyrel)  Varenicline (Chantix)  Vitamin C ==================================================================== For clinical consultation, please call [phone]. ====================================================================    No results found for: "CBDTHCR", "D8THCCBX", "D9THCCBX"   Laboratory Chemistry Profile   Renal Lab Results  Component Value Date   BUN 24 10/08/2022   CREATININE 1.04 10/08/2022   BCR 23 10/08/2022   GFR 44.86 (L) 08/27/2022   GFRAA >60 09/25/2019   GFRNONAA 55 (L) 06/08/2020    Hepatic Lab Results  Component Value Date   AST 23 08/27/2022   ALT 18 08/27/2022   ALBUMIN 4.1 08/27/2022   ALKPHOS 51 08/27/2022   AMMONIA 14 08/09/2016    Electrolytes Lab Results  Component Value Date   NA 136 10/08/2022   K 4.8 10/08/2022   CL 97 10/08/2022   CALCIUM 9.7 10/08/2022   MG 1.4 (L) 09/08/2020    Bone Lab Results  Component Value Date   VD25OH 37.09 11/10/2021    Inflammation (CRP: Acute Phase) (ESR: Chronic Phase) Lab  Results  Component Value Date   ESRSEDRATE 15 07/20/2019   LATICACIDVEN 1.9 06/08/2020         Note: Above Lab results reviewed.  Imaging  CT SCANOGRAM CLINICAL DATA:  Leg length discrepancy  EXAM: CT SCANOGRAM OF THE LOWER BILATERAL EXTREMITY WITHOUT CONTRAST  TECHNIQUE: CT scanogram of the bilateral lower extremities was performed.  RADIATION DOSE REDUCTION: This exam was performed according to the departmental dose-optimization program which includes automated exposure control, adjustment of the mA and/or kV according to patient size and/or use of iterative reconstruction technique.  COMPARISON:  None Available.  FINDINGS: Limited CT scan of the bilateral lower extremities ogram was obtained for the purposes of leg length assessment. Leg length measurements obtained from the superior aspect of the femoral head to the central tibial  plafond. Right lower extremity leg length of 89.0 cm. Left lower extremity leg length of 89.4 cm. A right knee prosthesis is evident.  IMPRESSION: Leg length measurements, as above.  Electronically Signed   By: Duanne Guess D.O.   On: 10/23/2022 11:40  Assessment  The primary encounter diagnosis was Lumbosacral radiculopathy. Diagnoses of Chronic pain of left ankle, Primary osteoarthritis of left ankle, Osteochondral defect of ankle (left), Lumbar facet joint syndrome, and Chronic pain syndrome were also pertinent to this visit.  Plan of Care  Problem-specific:  No problem-specific Assessment & Plan notes found for this encounter.  James Hood has a current medication list which includes the following long-term medication(s): bupropion, cholestyramine, donepezil, duloxetine, fenofibrate, gabapentin, lisinopril-hydrochlorothiazide, memantine, metformin, metoprolol succinate, omega-3 acid ethyl esters, omeprazole, pramipexole, primidone, quetiapine, rosuvastatin, and [START ON 12/02/2022] trazodone.  Pharmacotherapy  (Medications Ordered): Meds ordered this encounter  Medications   oxyCODONE-acetaminophen (PERCOCET) 10-325 MG tablet    Sig: Take 1 tablet by mouth every 6 (six) hours as needed for pain. Must last 30 days.    Dispense:  120 tablet    Refill:  0    Chronic Pain: STOP Act (Not applicable) Fill 1 day early if closed on refill date. Avoid benzodiazepines within 8 hours of opioids    Follow-up plan:   Return in about 4 weeks (around 12/26/2022) for Medication Management, in person.     Recent Visits No visits were found meeting these conditions. Showing recent visits within past 90 days and meeting all other requirements Today's Visits Date Type Provider Dept  11/28/22 Office Visit Edward Jolly, MD [facility]  Showing today's visits and meeting all other requirements Future Appointments No visits were found meeting these conditions. Showing future appointments within next 90 days and meeting all other requirements  I discussed the assessment and treatment plan with the patient. The patient was provided an opportunity to ask questions and all were answered. The patient agreed with the plan and demonstrated an understanding of the instructions.  Patient advised to call back or seek an in-person evaluation if the symptoms or condition worsens.  Duration of encounter: .  Note by: Edward Jolly, MD Date: 11/28/2022; Time: 3:48 PM

## 2022-11-29 NOTE — Progress Notes (Signed)
Virtual Visit via Video Note  I connected with [name] on 12/11/22 at  8:30 AM EDT by a video enabled telemedicine application and verified that I am speaking with the correct person using two identifiers.  Location: Patient: home Provider: office Persons participated in the visit- patient, provider    I discussed the limitations of evaluation and management by telemedicine and the availability of in person appointments. The patient expressed understanding and agreed to proceed.    I discussed the assessment and treatment plan with the patient. The patient was provided an opportunity to ask questions and all were answered. The patient agreed with the plan and demonstrated an understanding of the instructions.   The patient was advised to call back or seek an in-person evaluation if the symptoms worsen or if the condition fails to improve as anticipated.  I provided 17 minutes of non-face-to-face time during this encounter.   [name], MD    [facility] MD/PA/NP OP Progress Note  12/11/2022 8:59 AM AMYR WASZAK  MRN:  [id number]  Chief Complaint:  Chief Complaint  Patient presents with   Follow-up   HPI:  This is a follow-up appointment for depression and insomnia.  He states that he now lives in [place]. He sold a house and moved in to a camper, which his nearby his daughter's house.  They could not afford the payment.  Although it was significantly difficult initially, he is son-in-law has been very helpful.  He thinks the relationship between his wife, his daughter and himself has been improving.  He thinks things have settled down.  Although it has been hard to leave a much smaller space, he finds peace and beauty, sitting in a chair on a deck.  He believes things will come around.  He denies any concern about his mood at this time.  His anxiety has been improving.  He feels less depressed.  He sleeps well.  He denies SI.  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  He tries to leave it.  He had 1 fall when he was chasing his dog. He denies dizziness. He feels comfortable to stay on the current medication regimen as it is.   Visit Diagnosis:    ICD-10-CM   1. MDD (major depressive disorder), recurrent episode, mild (HCC)  F33.0     2. Insomnia, unspecified type  G47.00       Past Psychiatric History: Please see initial evaluation for full details. I have reviewed the history. No updates at this time.     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   Colon polyps    Dementia (HCC)    Depression    Diabetes mellitus type 2 in nonobese (HCC)    GERD (gastroesophageal reflux disease)    BARRETTS ESOPHAGUS RESOLVED PER PATIENT   Headache    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HCC)    History of hiatal hernia    Hypercholesteremia    Hypertension    Hyperthyroidism    Neuropathic pain    Primary localized osteoarthritis of right knee    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   Stomach ulcer    Tremor, essential    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/02/2018  Past Surgical History:  Procedure Laterality Date   ANKLE ARTHROSCOPY Left 09/29/2019   Procedure: LEFT ANKLE ARTHROSCOPY, DEBRIDEMENT;  Surgeon: Nadara Mustard, MD;  Location: [facility];  Service: Orthopedics;  Laterality: Left;   BACK SURGERY     COLONOSCOPY WITH PROPOFOL N/A 09/14/2016   Procedure: COLONOSCOPY WITH PROPOFOL;  Surgeon: Scot Jun, MD;  Location: [facility];  Service: Endoscopy;  Laterality: N/A;   esophageal stretch     ESOPHAGOGASTRODUODENOSCOPY (EGD) WITH PROPOFOL N/A 12/13/2020   Procedure: ESOPHAGOGASTRODUODENOSCOPY (EGD) WITH PROPOFOL;  Surgeon: Regis Bill, MD;  Location: [facility] ENDOSCOPY;  Service:  Endoscopy;  Laterality: N/A;  IDDM   JOINT REPLACEMENT Right 2016   knee   LUMBAR LAMINECTOMY/DECOMPRESSION MICRODISCECTOMY Left 02/03/2016   Procedure: LEFT L5-S1 DISKECTOMY;  Surgeon: Hilda Lias, MD;  Location: [facility];  Service: Neurosurgery;  Laterality: Left;  LEFT L5-S1 DISKECTOMY   PULSE GENERATOR IMPLANT N/A 08/21/2017   Procedure: UNILATERAL PULSE GENERATOR IMPLANT;  Surgeon: Venetia Night, MD;  Location: [facility] ORS;  Service: Neurosurgery;  Laterality: N/A;   PULSE GENERATOR IMPLANT Right 04/02/2018   Procedure: REMOVAL OF PULSE GENERATOR IMPLANT AND LEADS;  Surgeon: Venetia Night, MD;  Location: [facility] ORS;  Service: Neurosurgery;  Laterality: Right;   TONSILLECTOMY     TOTAL KNEE ARTHROPLASTY Right 11/15/2014   Procedure: TOTAL KNEE ARTHROPLASTY;  Surgeon: Salvatore Marvel, MD;  Location[facility];  Service: Orthopedics;  Laterality: Right;    Family Psychiatric History: Please see initial evaluation for full details. I have reviewed the history. No updates at this time.    Family History:  Family History  Problem Relation Age of Onset   Heart attack Mother    Heart disease Mother    Hypertension Father    Depression Father    Kidney cancer Neg Hx    Prostate cancer Neg Hx     Social History:  Social History   Socioeconomic History   Marital status: Married    Spouse name: Not on file   Number of children: Not on file   Years of education: Not on file   Highest education level: Master's degree (e.g., MA, MS, MEng, MEd, MSW, MBA)  Occupational History   Not on file  Tobacco Use   Smoking status: Every Day    Current packs/day: 0.50    Average packs/day: 0.5 packs/day for 60.0 years (30.0 ttl pk-yrs)    Types: Cigarettes   Smokeless tobacco: Never  Vaping Use   Vaping status: Former  Substance and Sexual Activity   Alcohol use: Not Currently    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   Drug use: Never   Sexual activity: Not Currently  Other Topics  Concern   Not on file  Social History Narrative   Not on file   Social Determinants of Health   Financial Resource Strain: Low Risk  (09/25/2022)   Overall Financial Resource Strain (CARDIA)    Difficulty of Paying Living Expenses: Not hard at all  Food Insecurity: No Food Insecurity (09/25/2022)   Hunger Vital Sign    Worried About Running Out of Food in the Last Year: Never true    Ran Out of Food in the Last Year: Never true  Transportation Needs: No Transportation Needs (09/25/2022)   PRAPARE - Administrator, Civil Service (Medical): No    Lack of Transportation (Non-Medical): No  Physical Activity: Unknown (09/25/2022)   Exercise Vital Sign    Days of Exercise per  Week: 0 days    Minutes of Exercise per Session: Not on file  Stress: Stress Concern Present (09/25/2022)   [employer] of Occupational Health - Occupational Stress Questionnaire    Feeling of Stress : To some extent  Social Connections: Moderately Integrated (09/25/2022)   Social Connection and Isolation Panel [NHANES]    Frequency of Communication with Friends and Family: More than three times a week    Frequency of Social Gatherings with Friends and Family: More than three times a week    Attends Religious Services: More than 4 times per year    Active Member of Clubs or Organizations: No    Attends Engineer, structural: Not on file    Marital Status: Married    Allergies: No Known Allergies  Metabolic Disorder Labs: Lab Results  Component Value Date   HGBA1C 6.9 (H) 12/04/2022   MPG 131 01/25/2016   MPG 166 11/05/2014   No results found for: "PROLACTIN" Lab Results  Component Value Date   CHOL 119 08/27/2022   TRIG 83.0 08/27/2022   HDL 65.70 08/27/2022   CHOLHDL 2 08/27/2022   VLDL 16.6 08/27/2022   LDLCALC 37 08/27/2022   LDLCALC 57 09/25/2017   Lab Results  Component Value Date   TSH 1.93 12/04/2022   TSH 1.08 08/02/2021    Therapeutic Level Labs: No results found for:  "LITHIUM" No results found for: "VALPROATE" No results found for: "CBMZ"  Current Medications: Current Outpatient Medications  Medication Sig Dispense Refill   aspirin EC 81 MG tablet Take 81 mg by mouth daily.     buPROPion (WELLBUTRIN XL) 300 MG 24 hr tablet Take 1 tablet (300 mg total) by mouth daily. 90 tablet 1   cholestyramine (QUESTRAN) 4 g packet Take 1 packet by mouth 2 (two) times daily.     ciclopirox (PENLAC) 8 % solution Apply topically at bedtime. Apply over nail and surrounding skin. Apply daily over previous coat. After seven (7) days, may remove with alcohol and continue cycle. 6.6 mL 0   Continuous Blood Gluc Sensor (FREESTYLE LIBRE 14 DAY SENSOR) MISC APPLY 1 SENSOR EVERY 14 DAYS TO MONITOR BLOOD GLUCOSE LEVELS. REMOVE OLD SENSOR BEFORE APPLYING NEW SENSOR 2 each 5   Continuous Glucose Sensor (FREESTYLE LIBRE 14 DAY SENSOR) MISC APPLY SENSOR EVERY 14 DAYS TO MONITOR BLOOD GLUCOSE LEVELS. REMOVE OLD SENSOR BEFORE APPLYING NEW SENSOR 2 each 5   dapagliflozin propanediol (FARXIGA) 10 MG TABS tablet Take 1 tablet (10 mg total) by mouth daily. 90 tablet 3   diphenoxylate-atropine (LOMOTIL) 2.5-0.025 MG tablet Take by mouth.     divalproex (DEPAKOTE ER) 500 MG 24 hr tablet Take 1 tablet by mouth 2 (two) times daily.     donepezil (ARICEPT) 10 MG tablet Take 10 mg by mouth at bedtime.     DULoxetine (CYMBALTA) 30 MG capsule Take 1 capsule (30 mg total) by mouth every evening for 7 days, THEN 2 capsules (60 mg total) every evening. 73 capsule 0   fenofibrate (TRICOR) 145 MG tablet TAKE 1 TABLET BY MOUTH DAILY 90 tablet 1   gabapentin (NEURONTIN) 600 MG tablet TAKE TWO TABLETS BY MOUTH THREE TIMES A DAY 180 tablet 1   lidocaine (LIDODERM) 5 % Place 1 patch onto the skin every 12 (twelve) hours. Remove & Discard patch within 12 hours or as directed by MD 30 patch 2   linagliptin (TRADJENTA) 5 MG TABS tablet Take 1 tablet (5 mg total) by mouth daily. 30 tablet  5    lisinopril-hydrochlorothiazide (ZESTORETIC) 20-25 MG tablet TAKE 1 TABLET BY MOUTH TWICE A DAY 180 tablet 1   memantine (NAMENDA) 10 MG tablet Take 1 tablet by mouth daily.     metFORMIN (GLUCOPHAGE-XR) 500 MG 24 hr tablet TAKE 1 TABLET BY MOUTH TWICE A DAY 180 tablet 1   metoprolol succinate (TOPROL-XL) 25 MG 24 hr tablet Take 25 mg by mouth daily.     Multiple Vitamins-Minerals (CENTRUM SILVER PO) Take 1 tablet by mouth daily.     omega-3 acid ethyl esters (LOVAZA) 1 g capsule TAKE 2 CAPSULES BY MOUTH TWICE A DAY 360 capsule 3   omeprazole (PRILOSEC) 20 MG capsule Take 20 mg by mouth daily.     oxyCODONE-acetaminophen (PERCOCET) 10-325 MG tablet Take 1 tablet by mouth every 6 (six) hours as needed for pain. Must last 30 days. 120 tablet 0   pantoprazole (PROTONIX) 40 MG tablet Take 40 mg by mouth daily.     pramipexole (MIRAPEX) 0.125 MG tablet TAKE 1 TABLET BY MOUTH EVERY NIGHT 2-3 HOURS BEFORE BEDTIME 90 tablet 1   primidone (MYSOLINE) 50 MG tablet Take 150-250 mg by mouth 2 (two) times daily. Take 250 mg by mouth in the morning & take 150 mg at night.     QUEtiapine (SEROQUEL) 25 MG tablet Take 75 mg by mouth at bedtime.     rosuvastatin (CRESTOR) 40 MG tablet TAKE ONE TABLET BY MOUTH DAILY 90 tablet 3   tamsulosin (FLOMAX) 0.4 MG CAPS capsule TAKE 2 CAPSULES BY MOUTH DAILY AFTER SUPPER 180 capsule 0   traZODone (DESYREL) 100 MG tablet Take 2 tablets (200 mg total) by mouth at bedtime as needed for sleep. 180 tablet 0   vitamin C (ASCORBIC ACID) 500 MG tablet Take 1,000 mg by mouth daily.     No current facility-administered medications for this visit.     Musculoskeletal: Strength & Muscle Tone: within normal limits Gait & Station: normal Patient leans: N/A  Psychiatric Specialty Exam: Review of Systems  Psychiatric/Behavioral:  Negative for agitation, behavioral problems, confusion, decreased concentration, dysphoric mood, hallucinations, self-injury, sleep disturbance and suicidal  ideas. The patient is nervous/anxious. The patient is not hyperactive.   All other systems reviewed and are negative.   There were no vitals taken for this visit.There is no height or weight on file to calculate BMI.  General Appearance: Fairly Groomed  Eye Contact:  Good  Speech:  Clear and Coherent  Volume:  Normal  Mood:   okay  Affect:  Appropriate, Congruent, and calm  Thought Process:  Coherent  Orientation:  Full (Time, Place, and Person)  Thought Content: Logical   Suicidal Thoughts:  No  Homicidal Thoughts:  No  Memory:  Immediate;   Good  Judgement:  Good  Insight:  Good  Psychomotor Activity:  Normal  Concentration:  Concentration: Good and Attention Span: Good  Recall:  Good  Fund of Knowledge: Good  Language: Good  Akathisia:  No  Handed:  Right  AIMS (if indicated): not done  Assets:  Communication Skills Desire for Improvement  ADL's:  Intact  Cognition: WNL  Sleep:  Good   Screenings: GAD-7    Flowsheet Row Office Visit from 12/04/2022 in [facility] [employer] Visit from 10/24/2022 in [facility] [employer] Visit from 10/08/2022 in [facility] Counselor from 09/26/2022 in [facility] [facility] at [facility] Visit from 08/27/2022 in [place]  [facility] conservation officer at [employer]  Total GAD-7 Score 2 5 11 20 1       Mini-Mental    Flowsheet Row Clinical Support from 11/15/2017 in [facility] Clinical Support from 11/14/2016 in [facility] [employer]  Total Score (max 30 points ) 30 30      PHQ2-9    Flowsheet Row Office Visit from 12/04/2022 in [facility] [place] [facility] [employer] Visit from 10/24/2022 in [facility] [employer] Visit from 10/08/2022 in [facility] Counselor from 09/26/2022 in [facility] at [facility] Visit from 08/27/2022 in [facility] at [employer]  PHQ-2 Total Score 0 2 2 1  0  PHQ-9 Total Score 1 4 5  -- 2      Flowsheet Row Counselor from 12/05/2022 in [facility] [facility] at [facility] from 09/26/2022 in [facility] at [facility] from 07/25/2022 in [facility] at [facility] RISK CATEGORY No Risk No Risk No Risk        Assessment and Plan:  ALFREDDIE DEVER is a 82 y.o. year old male with a history of  pseudo dementia, sleep disorder, tremor, history of QTc prolongation , who presents for follow up appointment for below.    1. MDD (major depressive disorder), recurrent episode, mild (HCC) Acute stressors include: ankle pain, moving into a camper due to financial strain  Other stressors include: conflict with his step son, financial strain    History:   There has been overall improvement in depressive symptoms and anxiety since the last visit.  Although it is difficult to discern whether duloxetine has been effective for his mood or his mood symptoms with situational, will continue current dose of duloxetine along with bupropion to target depression at this time.  Noted that he is also on quetiapine, gabapentin, which has been prescribed by another provider; will consider tapering off duloxetine in the future to avoid polypharmacy if his mood continues to improve.   2. Insomnia, unspecified type Stable.  Will continue trazodone as needed for insomnia. He is informed again regarding its potential risk of drowsiness and fall especially with concomitant use of quetiapine, gabapentin and opioid.     # Alcohol use disorder in sustained remission  History:  He began drinking in college but has been abstinent since the age of 31. He actively participates in [employer]. Stable. Although he reports occasional craving, he has been abstinent from alcohol.  Will continue motivational interview.    # History of cognitive impairment Followed-up by neurologist, and has been on memantine and donepezil. 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  Acute risk factors for suicide include: N/A. Protective factors for this patient include: positive social support, responsibility to others (children, family), coping skills and hope for the future. Considering these factors, the overall suicide risk at this point appears to be low. Patient is appropriate for outpatient follow up.  Collaboration of Care: Collaboration of Care: Other reviewed notes in Epic  Patient/Guardian was advised Release of Information must be obtained prior to any record release in order to collaborate their care with an outside provider. Patient/Guardian was advised if they have not already done so to contact the registration department to sign all necessary forms in order for [place] to release information regarding their care.   Consent: Patient/Guardian gives verbal consent for treatment and assignment of benefits for services provided during this visit. Patient/Guardian expressed understanding and agreed to proceed.    [name], MD 12/11/2022, 8:59 AM

## 2022-12-04 ENCOUNTER — Ambulatory Visit: Payer: PPO | Admitting: Family Medicine

## 2022-12-04 ENCOUNTER — Encounter: Payer: Self-pay | Admitting: Family Medicine

## 2022-12-04 ENCOUNTER — Ambulatory Visit (INDEPENDENT_AMBULATORY_CARE_PROVIDER_SITE_OTHER): Payer: PPO

## 2022-12-04 VITALS — BP 116/76 | HR 85 | Temp 97.5°F | Ht 70.0 in | Wt 157.4 lb

## 2022-12-04 DIAGNOSIS — R0689 Other abnormalities of breathing: Secondary | ICD-10-CM | POA: Diagnosis not present

## 2022-12-04 DIAGNOSIS — M25572 Pain in left ankle and joints of left foot: Secondary | ICD-10-CM | POA: Diagnosis not present

## 2022-12-04 DIAGNOSIS — Z7985 Long-term (current) use of injectable non-insulin antidiabetic drugs: Secondary | ICD-10-CM | POA: Diagnosis not present

## 2022-12-04 DIAGNOSIS — E1142 Type 2 diabetes mellitus with diabetic polyneuropathy: Secondary | ICD-10-CM | POA: Diagnosis not present

## 2022-12-04 DIAGNOSIS — R634 Abnormal weight loss: Secondary | ICD-10-CM

## 2022-12-04 DIAGNOSIS — G8929 Other chronic pain: Secondary | ICD-10-CM | POA: Diagnosis not present

## 2022-12-04 DIAGNOSIS — I700 Atherosclerosis of aorta: Secondary | ICD-10-CM | POA: Diagnosis not present

## 2022-12-04 DIAGNOSIS — M25511 Pain in right shoulder: Secondary | ICD-10-CM | POA: Diagnosis not present

## 2022-12-04 LAB — HEMOGLOBIN A1C: Hgb A1c MFr Bld: 6.9 % — ABNORMAL HIGH (ref 4.6–6.5)

## 2022-12-04 LAB — CBC WITH DIFFERENTIAL/PLATELET
Basophils Absolute: 0 10*3/uL (ref 0.0–0.1)
Basophils Relative: 0.6 % (ref 0.0–3.0)
Eosinophils Absolute: 0.1 10*3/uL (ref 0.0–0.7)
Eosinophils Relative: 1.7 % (ref 0.0–5.0)
HCT: 36.3 % — ABNORMAL LOW (ref 39.0–52.0)
Hemoglobin: 11.9 g/dL — ABNORMAL LOW (ref 13.0–17.0)
Lymphocytes Relative: 24.9 % (ref 12.0–46.0)
Lymphs Abs: 1.9 10*3/uL (ref 0.7–4.0)
MCHC: 32.9 g/dL (ref 30.0–36.0)
MCV: 98.7 fl (ref 78.0–100.0)
Monocytes Absolute: 0.6 10*3/uL (ref 0.1–1.0)
Monocytes Relative: 8 % (ref 3.0–12.0)
Neutro Abs: 5 10*3/uL (ref 1.4–7.7)
Neutrophils Relative %: 64.8 % (ref 43.0–77.0)
Platelets: 317 10*3/uL (ref 150.0–400.0)
RBC: 3.68 Mil/uL — ABNORMAL LOW (ref 4.22–5.81)
RDW: 14.8 % (ref 11.5–15.5)
WBC: 7.7 10*3/uL (ref 4.0–10.5)

## 2022-12-04 LAB — COMPREHENSIVE METABOLIC PANEL
ALT: 23 U/L (ref 0–53)
AST: 32 U/L (ref 0–37)
Albumin: 4 g/dL (ref 3.5–5.2)
Alkaline Phosphatase: 55 U/L (ref 39–117)
BUN: 35 mg/dL — ABNORMAL HIGH (ref 6–23)
CO2: 30 mEq/L (ref 19–32)
Calcium: 9.7 mg/dL (ref 8.4–10.5)
Chloride: 99 mEq/L (ref 96–112)
Creatinine, Ser: 1.41 mg/dL (ref 0.40–1.50)
GFR: 46.3 mL/min — ABNORMAL LOW (ref >60.00)
Glucose, Bld: 211 mg/dL — ABNORMAL HIGH (ref 70–99)
Potassium: 4 mEq/L (ref 3.5–5.1)
Sodium: 136 mEq/L (ref 135–145)
Total Bilirubin: 0.4 mg/dL (ref 0.2–1.2)
Total Protein: 6.5 g/dL (ref 6.0–8.3)

## 2022-12-04 LAB — TSH: TSH: 1.93 u[IU]/mL (ref 0.35–5.50)

## 2022-12-04 NOTE — Assessment & Plan Note (Signed)
Chronic issue.  Continues to lose weight.  I suspect this is related to Ozempic.  He will discontinue Ozempic today.  Will see him back in 6 weeks for recheck of weight.  We will get lab work as outlined today and chest x-ray to evaluate for other underlying causes.

## 2022-12-04 NOTE — Assessment & Plan Note (Signed)
Possibly just related to mucous from smoking history.  Chest x-ray to be completed today.

## 2022-12-04 NOTE — Patient Instructions (Signed)
Nice to see you.  We will stop your ozempic today.  We will contact you with your lab and x-ray results.

## 2022-12-04 NOTE — Progress Notes (Signed)
James Alar, MD Phone: [phone]  James Hood is a 82 y.o. male who presents today for f/u.  Weight loss: Patient's weight has continued to trend down.  Colonoscopy is up-to-date.  He is aged out of PSA screening.  He is aged out of lung cancer screening.  He notes no nausea or vomiting.  No night sweats.  Notes for the last several days he has had some itching on his chest though no other itching.  No abdominal pain.  No blood in stool.  No blood in his urine.  He is on Ozempic and has been progressively losing weight since he went on that.  Chronic ankle pain: Patient reports he saw a specialist at [facility] regarding this.  He notes they advised him that his ankles were fine and he was told the pain was coming from his back so he is going to the spine clinic in [facility] for this.  Diabetes: He reports his A1c will be high.  He has been drinking regular [employer] recently.  He missed a dose of his Ozempic as well several weeks ago.  Over the last 90 days his glucose is up 7% above range and 89% in range and 4% below range.  Over the last 7 days he has 24% above range and 76% in range.  Right shoulder pain: Patient notes he is fallen a few times recently in the process of carrying boxes with his move and he fell chasing his dog.  He landed on his right shoulder 2 of the times.  He went to [facility] and notes he had an x-ray that did not reveal a fracture.  He notes they gave him a cortisone shot which did not really help.  He notes they advised him he did not have a rotator cuff injury and that he just bruised the tissue in his anterior shoulder.  Social History   Tobacco Use  Smoking Status Every Day   Current packs/day: 0.50   Average packs/day: 0.5 packs/day for 60.0 years (30.0 ttl pk-yrs)   Types: Cigarettes  Smokeless Tobacco Never    Current Outpatient Medications on File Prior to Visit  Medication Sig Dispense Refill   aspirin EC 81 MG tablet Take 81 mg by  mouth daily.     buPROPion (WELLBUTRIN XL) 300 MG 24 hr tablet Take 1 tablet (300 mg total) by mouth daily. 90 tablet 1   cholestyramine (QUESTRAN) 4 g packet Take 1 packet by mouth 2 (two) times daily.     ciclopirox (PENLAC) 8 % solution Apply topically at bedtime. Apply over nail and surrounding skin. Apply daily over previous coat. After seven (7) days, may remove with alcohol and continue cycle. 6.6 mL 0   Continuous Blood Gluc Sensor (FREESTYLE LIBRE 14 DAY SENSOR) MISC APPLY 1 SENSOR EVERY 14 DAYS TO MONITOR BLOOD GLUCOSE LEVELS. REMOVE OLD SENSOR BEFORE APPLYING NEW SENSOR 2 each 5   Continuous Glucose Sensor (FREESTYLE LIBRE 14 DAY SENSOR) MISC APPLY SENSOR EVERY 14 DAYS TO MONITOR BLOOD GLUCOSE LEVELS. REMOVE OLD SENSOR BEFORE APPLYING NEW SENSOR 2 each 5   dapagliflozin propanediol (FARXIGA) 10 MG TABS tablet Take 1 tablet (10 mg total) by mouth daily. 90 tablet 3   diphenoxylate-atropine (LOMOTIL) 2.5-0.025 MG tablet Take by mouth.     divalproex (DEPAKOTE ER) 500 MG 24 hr tablet Take 1 tablet by mouth 2 (two) times daily.     donepezil (ARICEPT) 10 MG tablet Take 10 mg by mouth at bedtime.  DULoxetine (CYMBALTA) 30 MG capsule Take 1 capsule (30 mg total) by mouth every evening for 7 days, THEN 2 capsules (60 mg total) every evening. 73 capsule 0   fenofibrate (TRICOR) 145 MG tablet TAKE 1 TABLET BY MOUTH DAILY 90 tablet 1   gabapentin (NEURONTIN) 600 MG tablet TAKE TWO TABLETS BY MOUTH THREE TIMES A DAY 180 tablet 1   lidocaine (LIDODERM) 5 % Place 1 patch onto the skin every 12 (twelve) hours. Remove & Discard patch within 12 hours or as directed by MD 30 patch 2   lisinopril-hydrochlorothiazide (ZESTORETIC) 20-25 MG tablet TAKE 1 TABLET BY MOUTH TWICE A DAY 180 tablet 1   memantine (NAMENDA) 10 MG tablet Take 1 tablet by mouth daily.     metFORMIN (GLUCOPHAGE-XR) 500 MG 24 hr tablet TAKE 1 TABLET BY MOUTH TWICE A DAY 180 tablet 1   metoprolol succinate (TOPROL-XL) 25 MG 24 hr  tablet Take 25 mg by mouth daily.     Multiple Vitamins-Minerals (CENTRUM SILVER PO) Take 1 tablet by mouth daily.     omega-3 acid ethyl esters (LOVAZA) 1 g capsule TAKE 2 CAPSULES BY MOUTH TWICE A DAY 360 capsule 3   omeprazole (PRILOSEC) 20 MG capsule Take 20 mg by mouth daily.     oxyCODONE-acetaminophen (PERCOCET) 10-325 MG tablet Take 1 tablet by mouth every 6 (six) hours as needed for pain. Must last 30 days. 120 tablet 0   pantoprazole (PROTONIX) 40 MG tablet Take 40 mg by mouth daily.     pramipexole (MIRAPEX) 0.125 MG tablet TAKE 1 TABLET BY MOUTH EVERY NIGHT 2-3 HOURS BEFORE BEDTIME 90 tablet 1   primidone (MYSOLINE) 50 MG tablet Take 150-250 mg by mouth 2 (two) times daily. Take 250 mg by mouth in the morning & take 150 mg at night.     QUEtiapine (SEROQUEL) 25 MG tablet Take 75 mg by mouth at bedtime.     rosuvastatin (CRESTOR) 40 MG tablet TAKE ONE TABLET BY MOUTH DAILY 90 tablet 3   tamsulosin (FLOMAX) 0.4 MG CAPS capsule TAKE 2 CAPSULES BY MOUTH DAILY AFTER SUPPER 180 capsule 0   traZODone (DESYREL) 100 MG tablet Take 2 tablets (200 mg total) by mouth at bedtime as needed for sleep. 180 tablet 0   vitamin C (ASCORBIC ACID) 500 MG tablet Take 1,000 mg by mouth daily.     No current facility-administered medications on file prior to visit.     ROS see history of present illness  Objective  Physical Exam Vitals:   12/04/22 0811  BP: 116/76  Pulse: 85  Temp: (!) 97.5 F (36.4 C)  SpO2: 95%    BP Readings from Last 3 Encounters:  12/04/22 116/76  10/24/22 124/70  09/28/22 128/78   Wt Readings from Last 3 Encounters:  12/04/22 157 lb 6.4 oz (71.4 kg)  10/24/22 161 lb 9.6 oz (73.3 kg)  09/28/22 165 lb (74.8 kg)    Physical Exam Constitutional:      General: He is not in acute distress.    Appearance: He is not diaphoretic.  Cardiovascular:     Rate and Rhythm: Normal rate and regular rhythm.     Heart sounds: Normal heart sounds.  Pulmonary:     Effort:  Pulmonary effort is normal.     Breath sounds: Normal breath sounds.  Musculoskeletal:     Comments: Patient with full passive range of motion right shoulder with no discomfort, has some tenderness over the musculature in his anterior shoulder, negative  empty can test right shoulder  Lymphadenopathy:     Cervical: No cervical adenopathy.     Upper Body:     Right upper body: No axillary adenopathy.     Left upper body: No axillary adenopathy.  Skin:    General: Skin is warm and dry.  Neurological:     Mental Status: He is alert.      Assessment/Plan: Please see individual problem list.  Weight loss Assessment & Plan: Chronic issue.  Continues to lose weight.  I suspect this is related to Ozempic.  He will discontinue Ozempic today.  Will see him back in 6 weeks for recheck of weight.  We will get lab work as outlined today and chest x-ray to evaluate for other underlying causes.  Orders: -     Comprehensive metabolic panel -     TSH -     CBC with Differential/Platelet -     DG Chest 2 View; Future  Abnormal breath sounds Assessment & Plan: Possibly just related to mucous from smoking history.  Chest x-ray to be completed today.  Orders: -     DG Chest 2 View; Future  Type 2 diabetes mellitus with diabetic polyneuropathy, without long-term current use of insulin (HCC) Assessment & Plan: Chronic issue.  Check A1c.  Patient will continue Farxiga 10 mg daily and metformin XR 500 mg twice daily.  Will have him stop Ozempic given his weight loss.  Will see what his A1c is and then determine what to replace the Ozempic with.  Orders: -     Hemoglobin A1c  Acute pain of right shoulder Assessment & Plan: Suspect bruising as advised by orthopedics.  Discussed this could take some time to improve.  Falls seem to be related to mechanical issues though likely his chronic pain in his ankle contributed as well after balance issues.  Patient will monitor at this time.   Chronic pain  of left ankle Assessment & Plan: Chronic issue.  It seems as though this may be coming from his back and possibly a pinched nerve.  He will see the spine specialist at [facility] for this issue.     Return in about 6 weeks (around 01/15/2023) for DM/weight follow-up.   James Alar, MD [facility]

## 2022-12-04 NOTE — Assessment & Plan Note (Signed)
Chronic issue.  It seems as though this may be coming from his back and possibly a pinched nerve.  He will see the spine specialist at [facility] for this issue.

## 2022-12-04 NOTE — Assessment & Plan Note (Signed)
Chronic issue.  Check A1c.  Patient will continue Farxiga 10 mg daily and metformin XR 500 mg twice daily.  Will have him stop Ozempic given his weight loss.  Will see what his A1c is and then determine what to replace the Ozempic with.

## 2022-12-04 NOTE — Assessment & Plan Note (Signed)
Suspect bruising as advised by orthopedics.  Discussed this could take some time to improve.  Falls seem to be related to mechanical issues though likely his chronic pain in his ankle contributed as well after balance issues.  Patient will monitor at this time.

## 2022-12-05 ENCOUNTER — Ambulatory Visit (HOSPITAL_COMMUNITY): Payer: PPO | Admitting: Licensed Clinical Social Worker

## 2022-12-05 ENCOUNTER — Encounter: Payer: Self-pay | Admitting: Family Medicine

## 2022-12-05 DIAGNOSIS — F411 Generalized anxiety disorder: Secondary | ICD-10-CM

## 2022-12-05 DIAGNOSIS — F330 Major depressive disorder, recurrent, mild: Secondary | ICD-10-CM | POA: Diagnosis not present

## 2022-12-05 NOTE — Progress Notes (Signed)
Virtual Visit via Video Note  I connected with James Hood on 12/05/22 at  8:00 AM EDT by a video enabled telemedicine application and verified that I am speaking with the correct person using two identifiers.  Location: Patient: home Provider: [facility]   I discussed the limitations of evaluation and management by telemedicine and the availability of in person appointments. The patient expressed understanding and agreed to proceed.   I discussed the assessment and treatment plan with the patient. The patient was provided an opportunity to ask questions and all were answered. The patient agreed with the plan and demonstrated an understanding of the instructions.   The patient was advised to call back or seek an in-person evaluation if the symptoms worsen or if the condition fails to improve as anticipated.  I provided 45 minutes of non-face-to-face time during this encounter.   Gelena Klosinski R Abrielle Finck, LCSW   THERAPIST PROGRESS NOTE  Session Time: [phone]  Participation Level: Active  Behavioral Response: Neat and Well GroomedAlertAnxious  Type of Therapy: Individual Therapy  Treatment Goals addressed:LTG: Reduce frequency, intensity, and duration of depression symptoms as evidenced by: pt self report  ProgressTowards Goals: Not Progressing  Interventions: CBT, Motivational Interviewing, and Supportive  Summary: James Hood is a 82 y.o. male who presents with improving symptoms related to depression. Pt reports a slight increase in anxiety symptoms. Pt reports good quality and quantity of sleep.  Clinician assisted pt with identifying situations/scenarios/schemas triggering anxiety and/or depression symptoms. Pt reports that he is still in the adjustment phase after selling his house and downsizing to a camper. Pt states that there are tons of boxes that need to be unpacked "but we don't have anywhere to put this stuff so we need to  give it away to goodwill".  Pt states his wife isn't happy and feels that they have made a mistake. Discussed normal adjustment expectations for both pt and wife. Allowed pt to explore and express thoughts and feelings and discussed current coping mechanisms. Pt reports that he is trying to remain positive and to have a positive perspective. Reviewed changes/recommendations.   Continued recommendations are as follows: self care behaviors, positive social engagements, focusing on overall work/home/life balance, and focusing on positive physical and emotional wellness.  Suicidal/Homicidal: No  Therapist Response: Pt is continuing to apply interventions learned in session into daily life situations. Pt is currently on track to meet goals utilizing interventions mentioned above. Personal growth and progress noted. Treatment to continue as indicated.   Plan: Informed patient that clinician will be leaving outpatient department. Allowed pt to explore any questions or concerns and discussed future counseling options/resources. Provided pt with psychoeducational resources and list of OPT therapists. Encouraged pt to continue with psychiatric med management appointments, if applicable.    Diagnosis:  Encounter Diagnoses  Name Primary?   MDD (major depressive disorder), recurrent episode, mild (HCC) Yes   Generalized anxiety disorder     Collaboration of Care: Other Pt encouraged to continue psychiatric care with Dr. [name]  Patient/Guardian was advised Release of Information must be obtained prior to any record release in order to collaborate their care with an outside provider. Patient/Guardian was advised if they have not already done so to contact the registration department to sign all necessary forms in order for [place] to release information regarding their care.   Consent: Patient/Guardian gives verbal consent for treatment and assignment of benefits for services provided during this visit.  Patient/Guardian expressed understanding and  agreed to proceed.   Ernest Haber Arelis Neumeier, LCSW 12/05/2022

## 2022-12-05 NOTE — Patient Instructions (Signed)
Outpatient Psychiatry and Counseling  FOR CRISIS:  call 911, Therapeutic Alternatives: Mobile Crisis Management 24 hours:  [phone], call [phone], [facility] ([facility]) [address] walk in, or go to your local EMERGENCY DEPARTMENT  [facility] [address], [place], [place] [address]  [phone]  [facility] [address] [place], [place] [address] [phone]  [facility] [address] [place],  [place]  [address] [phone]  [facility] Address: [address] [place], [place] [address] Phone: [phone]  [facility] [place] and [place] Locations) [email or web address]  [employer] [facility] [employer] fee and walk in schedule: M-F 8am-12pm/1pm-3pm [address]  [place], [place] [address] [phone]  [facility] [address] [place], [place] [address] [phone]  [facility] Outpatient Services/ Intensive Outpatient Therapy Program/CDIOP/PHP [address] [place], [place] [address] [phone]  [facility], Outpatient Therapy Services, [place] in [place]      [phone]     [address]    [place], [place] [address]                 [facility]   [facility] [phone]. [address] [place], [place] [address]  [facility]          [address] [place]  [place], [place] [address]       [phone]  [facility] [address] [place], [place] [address] [phone]  [facility]    [address]    [place], [place] [address]     [phone]       [facility] [address] [place] [address]  [facility]     [address] [place], [place]      [phone]       [facility] Eulogio Ditch, MD [address] [place], [place] [address] [phone]  [facility]     [address]     [place], [place] [address]     [phone]       [facility] [address] [place], [place] [address] [phone] Resources for Temporary Residential Assistance/Crisis Centers

## 2022-12-06 MED ORDER — LINAGLIPTIN 5 MG PO TABS: 5.0000 mg | ORAL_TABLET | Freq: Every day | ORAL | 5 refills | Status: DC

## 2022-12-10 ENCOUNTER — Other Ambulatory Visit: Payer: Self-pay | Admitting: Family Medicine

## 2022-12-10 DIAGNOSIS — M19072 Primary osteoarthritis, left ankle and foot: Secondary | ICD-10-CM

## 2022-12-10 DIAGNOSIS — M7918 Myalgia, other site: Secondary | ICD-10-CM

## 2022-12-11 ENCOUNTER — Telehealth (INDEPENDENT_AMBULATORY_CARE_PROVIDER_SITE_OTHER): Payer: PPO | Admitting: Psychiatry

## 2022-12-11 ENCOUNTER — Encounter: Payer: Self-pay | Admitting: Family Medicine

## 2022-12-11 ENCOUNTER — Encounter: Payer: Self-pay | Admitting: Psychiatry

## 2022-12-11 DIAGNOSIS — F330 Major depressive disorder, recurrent, mild: Secondary | ICD-10-CM | POA: Diagnosis not present

## 2022-12-11 DIAGNOSIS — G4700 Insomnia, unspecified: Secondary | ICD-10-CM

## 2022-12-11 NOTE — Telephone Encounter (Signed)
Please call the patient and see if he has been taking the cymbalta 60 mg daily.

## 2022-12-12 DIAGNOSIS — I10 Essential (primary) hypertension: Secondary | ICD-10-CM | POA: Diagnosis not present

## 2022-12-12 DIAGNOSIS — M4185 Other forms of scoliosis, thoracolumbar region: Secondary | ICD-10-CM | POA: Diagnosis not present

## 2022-12-12 DIAGNOSIS — M4726 Other spondylosis with radiculopathy, lumbar region: Secondary | ICD-10-CM | POA: Diagnosis not present

## 2022-12-12 DIAGNOSIS — M419 Scoliosis, unspecified: Secondary | ICD-10-CM | POA: Diagnosis not present

## 2022-12-12 DIAGNOSIS — M5416 Radiculopathy, lumbar region: Secondary | ICD-10-CM | POA: Diagnosis not present

## 2022-12-12 DIAGNOSIS — E119 Type 2 diabetes mellitus without complications: Secondary | ICD-10-CM | POA: Diagnosis not present

## 2022-12-12 DIAGNOSIS — M5116 Intervertebral disc disorders with radiculopathy, lumbar region: Secondary | ICD-10-CM | POA: Diagnosis not present

## 2022-12-14 ENCOUNTER — Other Ambulatory Visit: Payer: Self-pay | Admitting: Family Medicine

## 2022-12-14 DIAGNOSIS — E782 Mixed hyperlipidemia: Secondary | ICD-10-CM

## 2022-12-19 ENCOUNTER — Encounter (INDEPENDENT_AMBULATORY_CARE_PROVIDER_SITE_OTHER): Payer: Self-pay

## 2022-12-20 ENCOUNTER — Ambulatory Visit
Payer: PPO | Attending: Student in an Organized Health Care Education/Training Program | Admitting: Student in an Organized Health Care Education/Training Program

## 2022-12-20 ENCOUNTER — Encounter: Payer: Self-pay | Admitting: Student in an Organized Health Care Education/Training Program

## 2022-12-20 VITALS — BP 140/79 | HR 101 | Temp 97.2°F | Resp 16 | Ht 70.0 in | Wt 155.0 lb

## 2022-12-20 DIAGNOSIS — M19072 Primary osteoarthritis, left ankle and foot: Secondary | ICD-10-CM | POA: Diagnosis not present

## 2022-12-20 DIAGNOSIS — M25572 Pain in left ankle and joints of left foot: Secondary | ICD-10-CM | POA: Diagnosis not present

## 2022-12-20 DIAGNOSIS — G894 Chronic pain syndrome: Secondary | ICD-10-CM

## 2022-12-20 DIAGNOSIS — M958 Other specified acquired deformities of musculoskeletal system: Secondary | ICD-10-CM | POA: Diagnosis not present

## 2022-12-20 DIAGNOSIS — M5417 Radiculopathy, lumbosacral region: Secondary | ICD-10-CM | POA: Insufficient documentation

## 2022-12-20 DIAGNOSIS — G8929 Other chronic pain: Secondary | ICD-10-CM | POA: Diagnosis not present

## 2022-12-20 MED ORDER — OXYCODONE-ACETAMINOPHEN 10-325 MG PO TABS
1.0000 | ORAL_TABLET | Freq: Four times a day (QID) | ORAL | 0 refills | Status: AC | PRN
Start: 2022-12-28 — End: 2023-01-27

## 2022-12-20 MED ORDER — OXYCODONE-ACETAMINOPHEN 10-325 MG PO TABS
1.0000 | ORAL_TABLET | Freq: Four times a day (QID) | ORAL | 0 refills | Status: DC | PRN
Start: 2023-02-26 — End: 2023-02-27

## 2022-12-20 MED ORDER — OXYCODONE-ACETAMINOPHEN 10-325 MG PO TABS
1.0000 | ORAL_TABLET | Freq: Four times a day (QID) | ORAL | 0 refills | Status: AC | PRN
Start: 2023-01-27 — End: 2023-02-26

## 2022-12-20 NOTE — Progress Notes (Signed)
PROVIDER NOTE: Information contained herein reflects review and annotations entered in association with encounter. Interpretation of such information and data should be left to medically-trained personnel. Information provided to patient can be located elsewhere in the medical record under "Patient Instructions". Document created using STT-dictation technology, any transcriptional errors that may result from process are unintentional.    Patient: James Hood  Service Category: E/M  Provider: Edward Jolly, MD  DOB: 1941-03-13  DOS: 12/20/2022  Specialty: Interventional Pain Management  MRN: [id number]  Setting: Ambulatory outpatient  PCP: Glori Luis, MD  Type: Established Patient    Referring Provider: Glori Luis, MD  Location: Office  Delivery: Face-to-face     HPI  Mr. CHRISTOS LAPRISE, a 82 y.o. year old male, is here today because of his Chronic pain of left ankle [M25.572, G89.29]. Mr. Tvedt's primary complain today is Ankle Pain (left)  Last encounter: My last encounter with him was on 11/28/22  Pertinent problems: Mr. Zemaitis has Primary localized osteoarthritis of right knee; DJD (degenerative joint disease) of knee; Lumbar herniated disc; Chronic pain syndrome; Failed back surgical syndrome; Lumbosacral radiculopathy; Primary osteoarthritis of left ankle; Lumbar facet joint syndrome; Neuropathy; Chronic pain of left ankle; and Osteochondral defect of ankle on their pertinent problem list. Pain Assessment: Severity of Chronic pain is reported as a 5 /10. Location: Ankle Left/left leg to the hip. Onset: More than a month ago. Quality: Aching. Timing: Constant. Modifying factor(s): Oxy/Acet, ASA, Tylenol. Vitals:  height is 5\' 10"  (1.778 m) and weight is 155 lb (70.3 kg). His temporal temperature is 97.2 F (36.2 C) (abnormal). His blood pressure is 140/79 (abnormal) and his pulse is 101 (abnormal). His respiration is 16 and oxygen saturation is 97%.   Reason for  encounter: medication management.   Presents today for medication management.   Continues to have persistent left ankle pain, has tried left ankle orthotic which was not helpful. He has also tried repeat left anterolateral ankle steroid injections with Dr. Ashley Royalty with limited response He saw orthopedics at [facility], has an upcoming appointment with neurosurgery after he gets his lumbar MRI. We discussed Sprint peripheral nerve stimulation of the common peroneal and tibial nerve at the popliteal fossa.  I provided him with resources regarding this.  He will think about this further and let me know.  Pharmacotherapy Assessment  Analgesic:  Oxycodone 10 mg every 6 hours as needed, quantity 120/month; MME equals 60    Monitoring: [place] PMP: PDMP reviewed during this encounter.       Pharmacotherapy: No side-effects or adverse reactions reported. Compliance: No problems identified. Effectiveness: Clinically acceptable.  Concepcion Elk, RN  12/20/2022 11:30 AM  Sign when Signing Visit Nursing Pain Medication Assessment:  Safety precautions to be maintained throughout the outpatient stay will include: orient to surroundings, keep bed in low position, maintain call bell within reach at all times, provide assistance with transfer out of bed and ambulation.  Medication Inspection Compliance: Pill count conducted under aseptic conditions, in front of the patient. Neither the pills nor the bottle was removed from the patient's sight at any time. Once count was completed pills were immediately returned to the patient in their original bottle.  Medication: Oxycodone/APAP Pill/Patch Count:  35 of 120 pills remain Pill/Patch Appearance: Markings consistent with prescribed medication Bottle Appearance: Standard pharmacy container. Clearly labeled. Filled Date: 07 / 10 / 2024 Last Medication intake:  TodaySafety precautions to be maintained throughout the outpatient stay will include: orient to surroundings,  keep  bed in low position, maintain call bell within reach at all times, provide assistance with transfer out of bed and ambulation.      UDS:  Summary  Date Value Ref Range Status  11/28/2021 Note  Final    Comment:    ==================================================================== [employer] [facility] 13 (MW) ==================================================================== Test                             Result       Flag       Units  Drug Present and Declared for Prescription Verification   Oxycodone                      176          EXPECTED   ng/mg creat   Oxymorphone                    732          EXPECTED   ng/mg creat   Noroxycodone                   1900         EXPECTED   ng/mg creat   Noroxymorphone                 585          EXPECTED   ng/mg creat    Sources of oxycodone are scheduled prescription medications.    Oxymorphone, noroxycodone, and noroxymorphone are expected    metabolites of oxycodone. Oxymorphone is also available as a    scheduled prescription medication.    Phenobarbital                  PRESENT      EXPECTED    Phenobarbital is an expected metabolite of primidone; Phenobarbital    may also be administered as a prescription drug.  Drug Absent but Declared for Prescription Verification   Hydromorphone                  Not Detected UNEXPECTED ng/mg creat ==================================================================== Test                      Result    Flag   Units      Ref Range   Creatinine              34               mg/dL      >=81 ==================================================================== Declared Medications:  The flagging and interpretation on this report are based on the  following declared medications.  Unexpected results may arise from  inaccuracies in the declared medications.   **Note: The testing scope of this panel includes these medications:   Hydromorphone (Dilaudid)  Oxycodone (Percocet)  Primidone (Mysoline)    **Note: The testing scope of this panel does not include the  following reported medications:   Acetaminophen (Percocet)  Aspirin  Atropine (Lomotil)  Budesonide (Entocort)  Bupropion (Wellbutrin XL)  Cholestyramine (Questran)  Dapagliflozin (Farxiga)  Diphenoxylate (Lomotil)  Divaleproex (Depakote)  Donepezil (Aricept)  Doxycycline  Fenofibrate (TriCor)  Gabapentin (Neurontin)  Hydrochlorothiazide (Zestoretic)  Iron  Lisinopril (Zestoretic)  Memantine (Namenda)  Metformin (Glucophage)  Metoprolol (Toprol)  Multivitamin  Omega-3 Fatty Acids  Omeprazole (Prilosec)  Pramipexole (Mirapex)  Promethazine  Quetiapine (Seroquel)  Rosuvastatin (Crestor)  Semaglutide (Ozempic)  Tamsulosin (  Flomax)  Trazodone (Desyrel)  Varenicline (Chantix)  Vitamin C ==================================================================== For clinical consultation, please call [phone]. ====================================================================      ROS  Constitutional: Denies any fever or chills Gastrointestinal: No reported hemesis, hematochezia, vomiting, or acute GI distress Musculoskeletal: Low back pain, left ankle pain Neurological: No reported episodes of acute onset apraxia, aphasia, dysarthria, agnosia, amnesia, paralysis, loss of coordination, or loss of consciousness  Medication Review  DULoxetine, FreeStyle Libre 14 Day Sensor, Multiple Vitamins-Minerals, QUEtiapine, ascorbic acid, aspirin EC, buPROPion, cholestyramine, ciclopirox, dapagliflozin propanediol, diphenoxylate-atropine, divalproex, donepezil, empagliflozin, fenofibrate, gabapentin, lidocaine, linagliptin, lisinopril-hydrochlorothiazide, memantine, metFORMIN, metoprolol succinate, omega-3 acid ethyl esters, omeprazole, oxyCODONE-acetaminophen, pantoprazole, pramipexole, primidone, rosuvastatin, tamsulosin, and traZODone  History Review  Allergy: Mr. Puck has No Known Allergies. Drug: Mr. Rumple   reports no history of drug use. Alcohol:  reports that he does not currently use alcohol. Tobacco:  reports that he has been smoking cigarettes. He has a 30 pack-year smoking history. He has never used smokeless tobacco. Social: Mr. Muscato  reports that he has been smoking cigarettes. He has a 30 pack-year smoking history. He has never used smokeless tobacco. He reports that he does not currently use alcohol. He reports that he does not use drugs. 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 Surgical: Mr. 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 Family: family history includes Depression in his father; Heart attack in his mother; Heart disease in his mother; Hypertension in his father.  Laboratory Chemistry Profile   Renal Lab Results  Component Value Date   BUN 35 (H) 12/04/2022   CREATININE 1.41 12/04/2022   BCR 23 10/08/2022   GFR 46.30 (L) 12/04/2022   GFRAA >60 09/25/2019    GFRNONAA 55 (L) 06/08/2020    Hepatic Lab Results  Component Value Date   AST 32 12/04/2022   ALT 23 12/04/2022   ALBUMIN 4.0 12/04/2022   ALKPHOS 55 12/04/2022   AMMONIA 14 08/09/2016    Electrolytes Lab Results  Component Value Date   NA 136 12/04/2022   K 4.0 12/04/2022   CL 99 12/04/2022   CALCIUM 9.7 12/04/2022   MG 1.4 (L) 09/08/2020    Bone Lab Results  Component Value Date   VD25OH 37.09 11/10/2021    Inflammation (CRP: Acute Phase) (ESR: Chronic Phase) Lab Results  Component Value Date   ESRSEDRATE 15 07/20/2019   LATICACIDVEN 1.9 06/08/2020         Note: Above Lab results reviewed.   CLINICAL DATA:  Left sciatic pain for 2 years.  Prior back surgery.   EXAM: MRI LUMBAR SPINE WITHOUT CONTRAST   TECHNIQUE: Multiplanar, multisequence MR imaging of the lumbar spine was performed. No intravenous contrast was administered.   COMPARISON:  03/29/2016   FINDINGS: Segmentation:  Standard.   Alignment:  Physiologic.  Vertebrae:  No fracture, evidence of discitis, or bone lesion.   Conus medullaris and cauda equina: Conus extends to the T12-L1 level. Conus and cauda equina appear normal.   Paraspinal and other soft tissues: No paraspinal abnormality.   Disc levels:   Disc spaces: Degenerative disc disease mild disc height loss throughout the lumbar spine.   T12-L1: No significant disc bulge. No evidence of neural foraminal stenosis. No central canal stenosis.   L1-L2: No significant disc bulge. No evidence of neural foraminal stenosis. No central canal stenosis.   L2-L3: No significant disc bulge. No evidence of neural foraminal stenosis. No central canal stenosis.   L3-L4: Mild broad-based disc bulge. Mild bilateral facet arthropathy. Moderate right foraminal stenosis. No central canal stenosis.   L4-L5: Mild broad-based disc bulge. Severe bilateral facet arthropathy. Bilateral lateral recess stenosis. Mild bilateral foraminal stenosis.  No central canal stenosis.   L5-S1: Broad-based disc bulge with a small central disc protrusion. Moderate bilateral facet arthropathy. Mild left foraminal stenosis. No central canal stenosis.   IMPRESSION: 1. At L3-4 there is a mild broad-based disc bulge. Mild bilateral facet arthropathy. Moderate right foraminal stenosis. 2. At L4-5 there is a mild broad-based disc bulge. Severe bilateral facet arthropathy. Bilateral lateral recess stenosis. Mild bilateral foraminal stenosis. 3. At L5-S1 there is a broad-based disc bulge with a small central disc protrusion. Moderate bilateral facet arthropathy. Mild left foraminal stenosis.     Electronically Signed   By: Elige Ko   On: 04/19/2017 10:33  Physical Exam  General appearance: Well nourished, well developed, and well hydrated. In no apparent acute distress Mental status: Alert, oriented x 3 (person, place, & time)       Respiratory: No evidence of acute respiratory distress Eyes: PERLA Vitals: BP (!) 140/79   Pulse (!) 101   Temp (!) 97.2 F (36.2 C) (Temporal)   Resp 16   Ht 5\' 10"  (1.778 m)   Wt 155 lb (70.3 kg)   SpO2 97%   BMI 22.24 kg/m  BMI: Estimated body mass index is 22.24 kg/m as calculated from the following:   Height as of this encounter: 5\' 10"  (1.778 m).   Weight as of this encounter: 155 lb (70.3 kg). Ideal: Ideal body weight: 73 kg (160 lb 15 oz)  Cervical Spine Area Exam  Skin & Axial Inspection: No masses, redness, edema, swelling, or associated skin lesions Alignment: Symmetrical Functional ROM: Unrestricted ROM      Stability: No instability detected Muscle Tone/Strength: Functionally intact. No obvious neuro-muscular anomalies detected. Sensory (Neurological): Unimpaired Palpation: No palpable anomalies              Lumbar Spine Area Exam  Skin & Axial Inspection: No masses, redness, or swelling Alignment: Symmetrical Functional ROM: Pain restricted ROM       Stability: No instability  detected Muscle Tone/Strength: Functionally intact. No obvious neuro-muscular anomalies detected. Sensory (Neurological): Musculoskeletal pain pattern articular, facet mediated Palpation: No palpable anomalies        Gait & Posture Assessment  Ambulation: Unassisted Gait: Relatively normal for age and body habitus Posture: WNL  Lower Extremity Exam    Side: Right lower extremity  Side: Left lower extremity  Stability: No instability observed          Stability: No instability observed          Skin & Extremity Inspection: Skin color, temperature, and hair growth are WNL. No peripheral edema or cyanosis. No masses, redness, swelling, asymmetry, or associated  skin lesions. No contractures.  Skin & Extremity Inspection: Skin color, temperature, and hair growth are WNL. No peripheral edema or cyanosis. No masses, redness, swelling, asymmetry, or associated skin lesions. No contractures.  Functional ROM: Unrestricted ROM                  Functional ROM: Pain restricted ROM  left ankle          Muscle Tone/Strength: Functionally intact. No obvious neuro-muscular anomalies detected.  Muscle Tone/Strength: Functionally intact. No obvious neuro-muscular anomalies detected.  Sensory (Neurological): Unimpaired        Sensory (Neurological): Arthropathic arthralgia ankle        DTR: Patellar: deferred today Achilles: deferred today Plantar: deferred today  DTR: Patellar: deferred today Achilles: deferred today Plantar: deferred today  Palpation: No palpable anomalies  Palpation: No palpable anomalies    Assessment   Diagnosis  1. Chronic pain of left ankle   2. Chronic pain syndrome   3. Primary osteoarthritis of left ankle   4. Osteochondral defect of ankle (left)   5. Lumbosacral radiculopathy         Plan of Care   [name] has a current medication list which includes the following long-term medication(s): bupropion, cholestyramine, donepezil, duloxetine, fenofibrate,  gabapentin, linagliptin, lisinopril-hydrochlorothiazide, memantine, metformin, metoprolol succinate, omega-3 acid ethyl esters, omeprazole, pramipexole, primidone, quetiapine, rosuvastatin, and trazodone.   Meds ordered this encounter  Medications   oxyCODONE-acetaminophen (PERCOCET) 10-325 MG tablet    Sig: Take 1 tablet by mouth every 6 (six) hours as needed for pain. Must last 30 days.    Dispense:  120 tablet    Refill:  0    Chronic Pain: STOP Act (Not applicable) Fill 1 day early if closed on refill date. Avoid benzodiazepines within 8 hours of opioids   oxyCODONE-acetaminophen (PERCOCET) 10-325 MG tablet    Sig: Take 1 tablet by mouth every 6 (six) hours as needed for pain. Must last 30 days.    Dispense:  120 tablet    Refill:  0    Chronic Pain: STOP Act (Not applicable) Fill 1 day early if closed on refill date. Avoid benzodiazepines within 8 hours of opioids   oxyCODONE-acetaminophen (PERCOCET) 10-325 MG tablet    Sig: Take 1 tablet by mouth every 6 (six) hours as needed for pain. Must last 30 days.    Dispense:  120 tablet    Refill:  0    Chronic Pain: STOP Act (Not applicable) Fill 1 day early if closed on refill date. Avoid benzodiazepines within 8 hours of opioids   Continue with Gabapentin UDS UTD Consider Sprint peripheral nerve stimulation of the common peroneal and tibial nerve at the popliteal fossa   Follow-up plan:   Return in about 14 weeks (around 03/28/2023) for Medication Management, in person.    Recent Visits Date Type Provider Dept  11/28/22 Office Visit Edward Jolly, MD [facility]  Showing recent visits within past 90 days and meeting all other requirements Today's Visits Date Type Provider Dept  12/20/22 Office Visit Edward Jolly, MD [facility]  Showing today's visits and meeting all other requirements Future Appointments No visits were found meeting these conditions. Showing future appointments within next 90 days and  meeting all other requirements  I discussed the assessment and treatment plan with the patient. The patient was provided an opportunity to ask questions and all were answered. The patient agreed with the plan and demonstrated an understanding of the  instructions.  Patient advised to call back or seek an in-person evaluation if the symptoms or condition worsens.  Duration of encounter: 30 minutes.  Note by: Edward Jolly, MD Date: 12/20/2022; Time: 1:16 PM

## 2022-12-20 NOTE — Progress Notes (Signed)
Nursing Pain Medication Assessment:  Safety precautions to be maintained throughout the outpatient stay will include: orient to surroundings, keep bed in low position, maintain call bell within reach at all times, provide assistance with transfer out of bed and ambulation.  Medication Inspection Compliance: Pill count conducted under aseptic conditions, in front of the patient. Neither the pills nor the bottle was removed from the patient's sight at any time. Once count was completed pills were immediately returned to the patient in their original bottle.  Medication: Oxycodone/APAP Pill/Patch Count:  35 of 120 pills remain Pill/Patch Appearance: Markings consistent with prescribed medication Bottle Appearance: Standard pharmacy container. Clearly labeled. Filled Date: 07 / 10 / 2024 Last Medication intake:  TodaySafety precautions to be maintained throughout the outpatient stay will include: orient to surroundings, keep bed in low position, maintain call bell within reach at all times, provide assistance with transfer out of bed and ambulation.

## 2022-12-24 DIAGNOSIS — E119 Type 2 diabetes mellitus without complications: Secondary | ICD-10-CM | POA: Diagnosis not present

## 2022-12-24 LAB — HM DIABETES EYE EXAM

## 2022-12-25 ENCOUNTER — Telehealth: Payer: Self-pay

## 2022-12-25 NOTE — Telephone Encounter (Signed)
Called pt to inform him of his pt assistance meds were ready for pick up he stated that [employer] took him off of ozempic

## 2022-12-26 DIAGNOSIS — K5730 Diverticulosis of large intestine without perforation or abscess without bleeding: Secondary | ICD-10-CM | POA: Diagnosis not present

## 2022-12-26 DIAGNOSIS — M9963 Osseous and subluxation stenosis of intervertebral foramina of lumbar region: Secondary | ICD-10-CM | POA: Diagnosis not present

## 2022-12-26 DIAGNOSIS — M4316 Spondylolisthesis, lumbar region: Secondary | ICD-10-CM | POA: Diagnosis not present

## 2022-12-26 DIAGNOSIS — M4807 Spinal stenosis, lumbosacral region: Secondary | ICD-10-CM | POA: Diagnosis not present

## 2022-12-26 DIAGNOSIS — M48061 Spinal stenosis, lumbar region without neurogenic claudication: Secondary | ICD-10-CM | POA: Diagnosis not present

## 2022-12-26 DIAGNOSIS — M4726 Other spondylosis with radiculopathy, lumbar region: Secondary | ICD-10-CM | POA: Diagnosis not present

## 2022-12-26 NOTE — Telephone Encounter (Signed)
See original message. Closing this note.

## 2022-12-26 NOTE — Telephone Encounter (Signed)
From 12/25/22:  Called pt to inform him of his pt assistance meds were ready for pick up he stated that [employer] took him off of ozempic     12/26/22-called to notify patient assistance program to D/C shipments.

## 2022-12-31 ENCOUNTER — Ambulatory Visit (INDEPENDENT_AMBULATORY_CARE_PROVIDER_SITE_OTHER): Payer: PPO | Admitting: *Deleted

## 2022-12-31 VITALS — Ht 70.0 in | Wt 160.0 lb

## 2022-12-31 DIAGNOSIS — Z0000 Encounter for general adult medical examination without abnormal findings: Secondary | ICD-10-CM

## 2022-12-31 NOTE — Progress Notes (Signed)
Subjective:   James Hood is a 82 y.o. male who presents for Medicare Annual/Subsequent preventive examination.  Visit Complete: Virtual  I connected with  [name] on 12/31/22 by a audio enabled telemedicine application and verified that I am speaking with the correct person using two identifiers.  Patient Location: Home  Provider Location: Office/Clinic  I discussed the limitations of evaluation and management by telemedicine. The patient expressed understanding and agreed to proceed.  Vital Signs: Unable to obtain new vitals due to this being a telehealth visit.  Review of Systems     Cardiac Risk Factors include: advanced age (>41men, >36 women);diabetes mellitus;dyslipidemia;hypertension;smoking/ tobacco exposure     Objective:    Today's Vitals   12/31/22 0812 12/31/22 0813  Weight: 160 lb (72.6 kg)   Height: 5\' 10"  (1.778 m)   PainSc:  5    Body mass index is 22.96 kg/m.     12/31/2022    8:38 AM 12/20/2022   11:27 AM 12/29/2021    2:26 PM 06/29/2021    8:07 AM 03/29/2021    1:39 PM 12/28/2020    9:55 AM 12/13/2020    7:52 AM  Advanced Directives  Does Patient Have a Medical Advance Directive? Yes Yes Yes No Yes Yes Yes  Type of Estate agent of [place];Living will  Healthcare Power of [place];Living will   Healthcare Power of [place];Living will Living will;Healthcare Power of Attorney  Does patient want to make changes to medical advance directive? No - Patient declined  No - Patient declined      Copy of Healthcare Power of Attorney in Chart? Yes - validated most recent copy scanned in chart (See row information)  Yes - validated most recent copy scanned in chart (See row information)   Yes - validated most recent copy scanned in chart (See row information)   Would patient like information on creating a medical advance directive?    No - Patient declined   No - Patient declined    Current Medications (verified) Outpatient  Encounter Medications as of 12/31/2022  Medication Sig   aspirin EC 81 MG tablet Take 81 mg by mouth daily.   buPROPion (WELLBUTRIN XL) 300 MG 24 hr tablet Take 1 tablet (300 mg total) by mouth daily.   Continuous Blood Gluc Sensor (FREESTYLE LIBRE 14 DAY SENSOR) MISC APPLY 1 SENSOR EVERY 14 DAYS TO MONITOR BLOOD GLUCOSE LEVELS. REMOVE OLD SENSOR BEFORE APPLYING NEW SENSOR   Continuous Glucose Sensor (FREESTYLE LIBRE 14 DAY SENSOR) MISC APPLY SENSOR EVERY 14 DAYS TO MONITOR BLOOD GLUCOSE LEVELS. REMOVE OLD SENSOR BEFORE APPLYING NEW SENSOR   dapagliflozin propanediol (FARXIGA) 10 MG TABS tablet Take 1 tablet (10 mg total) by mouth daily.   diphenoxylate-atropine (LOMOTIL) 2.5-0.025 MG tablet Take by mouth.   divalproex (DEPAKOTE ER) 500 MG 24 hr tablet Take 1 tablet by mouth 2 (two) times daily.   donepezil (ARICEPT) 10 MG tablet Take 10 mg by mouth at bedtime.   DULoxetine (CYMBALTA) 30 MG capsule Take 2 capsules (60 mg total) by mouth daily.   empagliflozin (JARDIANCE) 10 MG TABS tablet Take 1 tablet by mouth daily.   fenofibrate (TRICOR) 145 MG tablet TAKE 1 TABLET BY MOUTH DAILY   gabapentin (NEURONTIN) 600 MG tablet TAKE TWO TABLETS BY MOUTH THREE TIMES A DAY   lidocaine (LIDODERM) 5 % Place 1 patch onto the skin every 12 (twelve) hours. Remove & Discard patch within 12 hours or as directed by MD  linagliptin (TRADJENTA) 5 MG TABS tablet Take 1 tablet (5 mg total) by mouth daily.   lisinopril-hydrochlorothiazide (ZESTORETIC) 20-25 MG tablet TAKE 1 TABLET BY MOUTH TWICE A DAY   memantine (NAMENDA) 10 MG tablet Take 1 tablet by mouth daily.   metFORMIN (GLUCOPHAGE-XR) 500 MG 24 hr tablet TAKE 1 TABLET BY MOUTH TWICE A DAY   metoprolol succinate (TOPROL-XL) 25 MG 24 hr tablet Take 25 mg by mouth daily.   Multiple Vitamins-Minerals (CENTRUM SILVER PO) Take 1 tablet by mouth daily.   omega-3 acid ethyl esters (LOVAZA) 1 g capsule TAKE 2 CAPSULES BY MOUTH TWICE A DAY   omeprazole (PRILOSEC) 20  MG capsule Take 20 mg by mouth daily.   oxyCODONE-acetaminophen (PERCOCET) 10-325 MG tablet Take 1 tablet by mouth every 6 (six) hours as needed for pain. Must last 30 days.   [START ON 01/27/2023] oxyCODONE-acetaminophen (PERCOCET) 10-325 MG tablet Take 1 tablet by mouth every 6 (six) hours as needed for pain. Must last 30 days.   [START ON 02/26/2023] oxyCODONE-acetaminophen (PERCOCET) 10-325 MG tablet Take 1 tablet by mouth every 6 (six) hours as needed for pain. Must last 30 days.   pantoprazole (PROTONIX) 40 MG tablet Take 40 mg by mouth daily.   pramipexole (MIRAPEX) 0.125 MG tablet TAKE 1 TABLET BY MOUTH EVERY NIGHT 2-3 HOURS BEFORE BEDTIME   primidone (MYSOLINE) 50 MG tablet Take 150-250 mg by mouth 2 (two) times daily. Take 250 mg by mouth in the morning & take 150 mg at night.   QUEtiapine (SEROQUEL) 25 MG tablet Take 75 mg by mouth at bedtime.   rosuvastatin (CRESTOR) 40 MG tablet TAKE ONE TABLET BY MOUTH DAILY   tamsulosin (FLOMAX) 0.4 MG CAPS capsule TAKE 2 CAPSULES BY MOUTH DAILY AFTER SUPPER   traZODone (DESYREL) 100 MG tablet Take 2 tablets (200 mg total) by mouth at bedtime as needed for sleep.   vitamin C (ASCORBIC ACID) 500 MG tablet Take 1,000 mg by mouth daily.   cholestyramine (QUESTRAN) 4 g packet Take 1 packet by mouth 2 (two) times daily. (Patient not taking: Reported on 12/31/2022)   ciclopirox (PENLAC) 8 % solution Apply topically at bedtime. Apply over nail and surrounding skin. Apply daily over previous coat. After seven (7) days, may remove with alcohol and continue cycle. (Patient not taking: Reported on 12/31/2022)   No facility-administered encounter medications on file as of 12/31/2022.    Allergies (verified) Patient has no known allergies.   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   Stomach ulcer    Tremor, essential    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/02/2018   Past Surgical History:  Procedure Laterality Date   ANKLE ARTHROSCOPY Left 09/29/2019   Procedure: LEFT ANKLE ARTHROSCOPY, DEBRIDEMENT;  Surgeon: Nadara Mustard, MD;  Location: [facility];  Service: Orthopedics;  Laterality: Left;   BACK SURGERY     COLONOSCOPY WITH PROPOFOL N/A 09/14/2016   Procedure: COLONOSCOPY WITH PROPOFOL;  Surgeon: Scot Jun, MD;  Location: [facility] END[facility]COPY;  Service: Endoscopy;  Laterality: N/A;   esophageal stretch     ESOPHAGOGASTRODUODENOSCOPY (EGD) WITH PROPOFOL N/A 12/13/2020   Procedure: ESOPHAGOGASTRODUODENOSCOPY (EGD) WITH PROPOFOL;  Surgeon: Regis Bill, MD;  Location: [facility] ENDOSCOPY;  Service: Endoscopy;  Laterality: N/A;  IDDM   JOINT REPLACEMENT Right 2016   knee   LUMBAR LAMINECTOMY/DECOMPRESSION MICRODISCECTOMY Left 02/03/2016   Procedure: LEFT L5-S1 DISKECTOMY;  Surgeon: Hilda Lias, MD;  Location: [facility];  Service: Neurosurgery;  Laterality: Left;  LEFT L5-S1 DISKECTOMY   PULSE GENERATOR IMPLANT N/A 08/21/2017   Procedure: UNILATERAL PULSE GENERATOR IMPLANT;  Surgeon: Venetia Night, MD;  Location: [facility] ORS;  Service: Neurosurgery;  Laterality: N/A;   PULSE GENERATOR IMPLANT Right 04/02/2018   Procedure: REMOVAL OF PULSE GENERATOR IMPLANT AND LEADS;  Surgeon: Venetia Night, MD;  Location: [facility] ORS;  Service: Neurosurgery;  Laterality: Right;   TONSILLECTOMY     TOTAL KNEE ARTHROPLASTY Right 11/15/2014    Procedure: TOTAL KNEE ARTHROPLASTY;  Surgeon: Salvatore Marvel, MD;  Location: [facility] OR;  Service: Orthopedics;  Laterality: Right;   Family History  Problem Relation Age of Onset   Heart attack Mother    Heart disease Mother    Hypertension Father    Depression Father    Kidney cancer Neg Hx    Prostate cancer Neg Hx    Social History   Socioeconomic History   Marital status: Married    Spouse name: Not on file   Number of children: Not on file   Years of education: Not on file   Highest education level: Master's degree (e.g., MA, MS, MEng, MEd, MSW, MBA)  Occupational History   Not on file  Tobacco Use   Smoking status: Every Day    Current packs/day: 0.50    Average packs/day: 0.5 packs/day for 60.0 years (30.0 ttl pk-yrs)    Types: Cigarettes   Smokeless tobacco: Never  Vaping Use   Vaping status: Former  Substance and Sexual Activity   Alcohol use: Not Currently    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   Drug use: Never   Sexual activity: Not Currently  Other Topics Concern   Not on file  Social History Narrative   Not on file   Social Determinants of Health   Financial Resource Strain: Low Risk  (12/31/2022)   Overall Financial Resource Strain (CARDIA)    Difficulty of Paying Living Expenses: Not hard at all  Food Insecurity: No Food Insecurity (12/31/2022)   Hunger Vital Sign    Worried About Running Out of Food in the Last Year: Never true    Ran Out of Food in the Last Year: Never true  Transportation Needs: No Transportation Needs (12/31/2022)   PRAPARE - Administrator, Civil Service (Medical): No    Lack of Transportation (Non-Medical): No  Physical Activity: Inactive (12/31/2022)   Exercise Vital Sign    Days of Exercise per Week: 0 days    Minutes of Exercise per Session: 0 min  Stress: No Stress Concern Present (12/31/2022)   [employer] of Occupational Health - Occupational Stress Questionnaire    Feeling of Stress : Only a little   Social Connections: Socially Integrated (12/31/2022)   Social Connection and Isolation Panel [NHANES]  Frequency of Communication with Friends and Family: More than three times a week    Frequency of Social Gatherings with Friends and Family: More than three times a week    Attends Religious Services: More than 4 times per year    Active Member of [employer] or Organizations: Yes    Attends Engineer, structural: More than 4 times per year    Marital Status: Married    Tobacco Counseling Ready to quit: No Counseling given: Not Answered   Clinical Intake:  Pre-visit preparation completed: Yes  Pain : No/denies pain Pain Score: 5  Pain Type: Chronic pain Pain Location: Ankle (back pain) Pain Descriptors / Indicators: Contraction, Aching, Sharp Pain Onset: More than a month ago Pain Frequency: Constant     BMI - recorded: 22.96 Nutritional Status: BMI of 19-24  Normal Nutritional Risks: None, Nausea/ vomitting/ diarrhea, Unintentional weight loss (had diarrhea yesterday, this is a chronic problem) Diabetes: Yes CBG done?: No Did pt. bring in CBG monitor from home?: No  How often do you need to have someone help you when you read instructions, pamphlets, or other written materials from your doctor or pharmacy?: 1 - Never  Interpreter Needed?: No  Information entered by :: R.  LPN   Activities of Daily Living    12/31/2022    8:19 AM 12/30/2022   12:51 PM  In your present state of health, do you have any difficulty performing the following activities:  Hearing? 1 0  Vision? 0 0  Comment glasses   Difficulty concentrating or making decisions? 0 0  Walking or climbing stairs? 1 1  Comment has to use a cane or handrails   Dressing or bathing? 0 0  Doing errands, shopping? 0 0  Preparing Food and eating ? N N  Using the Toilet? N N  In the past six months, have you accidently leaked urine? Y Y  Comment wear depends   Do you have problems with loss of bowel  control? Malvin Johns  Comment this morning, sees a GI doctor   Managing your Medications? N N  Managing your Finances? N N  Housekeeping or managing your Housekeeping? N N    Patient Care Team: Glori Luis, MD as PCP - General (Family Medicine)  Indicate any recent Medical Services you may have received from other than [facility] providers in the past year (date may be approximate).     Assessment:   This is a routine wellness examination for [place].  Hearing/Vision screen Hearing Screening - Comments:: Hearing diffiiculty Vision Screening - Comments:: glasses  Dietary issues and exercise activities discussed:     Goals Addressed             This Visit's Progress    Patient Stated       Needs to exercise       Depression Screen    12/31/2022    8:26 AM 12/20/2022   11:27 AM 12/04/2022    8:22 AM 10/24/2022    4:31 PM 10/08/2022    8:34 AM 09/26/2022    8:09 AM 08/27/2022    8:32 AM  PHQ 2/9 Scores  PHQ - 2 Score 0 0 0 2   0  PHQ- 9 Score 3  1 4   2      Information is confidential and restricted. Go to Review Flowsheets to unlock data.    Fall Risk    12/31/2022    8:32 AM 12/30/2022   12:51 PM 12/20/2022  11:26 AM 12/04/2022    8:22 AM 10/24/2022    4:30 PM  Fall Risk   Falls in the past year? 1 1 1 1 1   Number falls in past yr: 1 1 0 1 1  Injury with Fall? 1 0 0 1 0  Comment injured shoulder      Risk for fall due to : History of fall(s);Medication side effect History of fall(s);Medication side effect  History of fall(s) History of fall(s)  Follow up Falls evaluation completed;Falls prevention discussed Falls evaluation completed;Falls prevention discussed  Falls evaluation completed Falls evaluation completed    MEDICARE RISK AT HOME:  Medicare Risk at Home - 12/31/22 0833     Any stairs in or around the home? Yes    If so, are there any without handrails? No    Home free of loose throw rugs in walkways, pet beds, electrical cords, etc? Yes    Adequate lighting in  your home to reduce risk of falls? Yes    Life alert? No    Use of a cane, walker or w/c? Yes    Grab bars in the bathroom? No    Shower chair or bench in shower? No    Elevated toilet seat or a handicapped toilet? No             Cognitive Function:    11/15/2017    8:47 AM 11/14/2016    4:50 PM  MMSE - Mini Mental State Exam  Orientation to time 5 5  Orientation to Place 5 5  Registration 3 3  Attention/ Calculation 5 5  Recall 3 3  Language- name 2 objects 2 2  Language- repeat 1 1  Language- follow 3 step command 3 3  Language- read & follow direction 1 1  Write a sentence 1 1  Copy design 1 1  Total score 30 30        12/31/2022    8:39 AM 12/29/2021    2:34 PM 12/01/2019    9:57 AM 11/28/2018   10:23 AM  6CIT Screen  What Year? 0 points 0 points 0 points 0 points  What month? 0 points 0 points 0 points 0 points  What time? 0 points 0 points  0 points  Count back from 20 0 points   0 points  Months in reverse 0 points  0 points 0 points  Repeat phrase 0 points  0 points 0 points  Total Score 0 points   0 points    Immunizations Immunization History  Administered Date(s) Administered   Fluad Quad(high Dose 65+) 02/05/2020, 02/28/2021, 01/19/2022   Hepatitis A, Adult 12/02/2018   Influenza, High Dose Seasonal PF 01/29/2017, 03/19/2018, 01/10/2019   Influenza,inj,Quad PF,6+ Mos 03/13/2016   Influenza-Unspecified 01/21/2015, 01/25/2022   PFIZER(Purple Top)SARS-COV-2 Vaccination 06/01/2019, 06/22/2019, 03/09/2020, 08/02/2020   Pneumococcal Conjugate-13 02/07/2015   Pneumococcal Polysaccharide-23 01/18/2018   Tdap 09/16/2015, 01/18/2018   Zoster Recombinant(Shingrix) 01/03/2018, 05/09/2018   Zoster, Live 09/16/2015    TDAP status: Up to date  Flu Vaccine status: Up to date  Pneumococcal vaccine status: Up to date  Covid-19 vaccine status: Completed vaccines  Qualifies for Shingles Vaccine? Yes   Zostavax completed Yes   Shingrix Completed?:  Yes  Screening Tests Health Maintenance  Topic Date Due   FOOT EXAM  01/03/2022   COVID-19 Vaccine (5 - 2023-24 season) 01/19/2022   Medicare Annual Wellness (AWV)  12/30/2022   INFLUENZA VACCINE  12/20/2022   HEMOGLOBIN A1C  06/06/2023  Diabetic kidney evaluation - Urine ACR  08/28/2023   Diabetic kidney evaluation - eGFR measurement  12/04/2023   OPHTHALMOLOGY EXAM  12/24/2023   DTaP/Tdap/Td (3 - Td or Tdap) 01/19/2028   Pneumonia Vaccine 43+ Years old  Completed   Zoster Vaccines- Shingrix  Completed   HPV VACCINES  Aged Out    Health Maintenance  Health Maintenance Due  Topic Date Due   FOOT EXAM  01/03/2022   COVID-19 Vaccine (5 - 2023-24 season) 01/19/2022   Medicare Annual Wellness (AWV)  12/30/2022   INFLUENZA VACCINE  12/20/2022    Colorectal cancer screening: No longer required.   Lung Cancer Screening: (Low Dose CT Chest recommended if Age 68-80 years, 20 pack-year currently smoking OR have quit w/in 15years.) does qualify.   Lung Cancer Screening Referral: No longer required age   Additional Screening:  Hepatitis C Screening: does not qualify; Completed NA age  Vision Screening: Recommended annual ophthalmology exams for early detection of glaucoma and other disorders of the eye. Is the patient up to date with their annual eye exam?  Yes  Who is the provider or what is the name of the office in which the patient attends annual eye exams? [facility] If pt is not established with a provider, would they like to be referred to a provider to establish care? No .   Dental Screening: Recommended annual dental exams for proper oral hygiene  Diabetic Foot Exam: Diabetic Foot Exam: Overdue, Pt has been advised about the importance in completing this exam. Pt is scheduled for diabetic foot exam on needs at next visit documented.  Community Resource Referral / Chronic Care Management: CRR required this visit?  No   CCM required this visit?  No     Plan:      I have personally reviewed and noted the following in the patient's chart:   Medical and social history Use of alcohol, tobacco or illicit drugs  Current medications and supplements including opioid prescriptions. Patient is currently taking opioid prescriptions. Information provided to patient regarding non-opioid alternatives. Patient advised to discuss non-opioid treatment plan with their provider. Functional ability and status Nutritional status Physical activity Advanced directives List of other physicians Hospitalizations, surgeries, and ER visits in previous 12 months Vitals Screenings to include cognitive, depression, and falls Referrals and appointments  In addition, I have reviewed and discussed with patient certain preventive protocols, quality metrics, and best practice recommendations. A written personalized care plan for preventive services as well as general preventive health recommendations were provided to patient.     Sydell Axon, LPN   [id number]   After Visit Summary: (MyChart) Due to this being a telephonic visit, the after visit summary with patients personalized plan was offered to patient via MyChart   Nurse Notes: None

## 2022-12-31 NOTE — Patient Instructions (Signed)
James Hood , Thank you for taking time to come for your Medicare Wellness Visit. I appreciate your ongoing commitment to your health goals. Please review the following plan we discussed and let me know if I can assist you in the future.   Referrals/Orders/Follow-Ups/Clinician Recommendations: None  This is a list of the screening recommended for you and due dates:  Health Maintenance  Topic Date Due   Complete foot exam   01/03/2022   COVID-19 Vaccine (5 - 2023-24 season) 01/19/2022   Flu Shot  12/20/2022   Hemoglobin A1C  06/06/2023   Yearly kidney health urinalysis for diabetes  08/28/2023   Yearly kidney function blood test for diabetes  12/04/2023   Eye exam for diabetics  12/24/2023   Medicare Annual Wellness Visit  12/31/2023   DTaP/Tdap/Td vaccine (3 - Td or Tdap) 01/19/2028   Pneumonia Vaccine  Completed   Zoster (Shingles) Vaccine  Completed   HPV Vaccine  Aged Out    Advanced directives: (In Chart) A copy of your advanced directives are scanned into your chart should your provider ever need it.  Next Medicare Annual Wellness Visit scheduled for next year: Yes 01/01/24 @ 8:15Managing Pain Without Opioids Opioids are strong medicines used to treat moderate to severe pain. For some people, especially those who have long-term (chronic) pain, opioids may not be the best choice for pain management due to: Side effects like nausea, constipation, and sleepiness. The risk of addiction (opioid use disorder). The longer you take opioids, the greater your risk of addiction. Pain that lasts for more than 3 months is called chronic pain. Managing chronic pain usually requires more than one approach and is often provided by a team of health care providers working together (multidisciplinary approach). Pain management may be done at a pain management center or pain clinic. How to manage pain without the use of opioids Use non-opioid medicines Non-opioid medicines for pain may  include: Over-the-counter or prescription non-steroidal anti-inflammatory drugs (NSAIDs). These may be the first medicines used for pain. They work well for muscle and bone pain, and they reduce swelling. Acetaminophen. This over-the-counter medicine may work well for milder pain but not swelling. Antidepressants. These may be used to treat chronic pain. A certain type of antidepressant (tricyclics) is often used. These medicines are given in lower doses for pain than when used for depression. Anticonvulsants. These are usually used to treat seizures but may also reduce nerve (neuropathic) pain. Muscle relaxants. These relieve pain caused by sudden muscle tightening (spasms). You may also use a pain medicine that is applied to the skin as a patch, cream, or gel (topical analgesic), such as a numbing medicine. These may cause fewer side effects than medicines taken by mouth. Do certain therapies as directed Some therapies can help with pain management. They include: Physical therapy. You will do exercises to gain strength and flexibility. A physical therapist may teach you exercises to move and stretch parts of your body that are weak, stiff, or painful. You can learn these exercises at physical therapy visits and practice them at home. Physical therapy may also involve: Massage. Heat wraps or applying heat or cold to affected areas. Electrical signals that interrupt pain signals (transcutaneous electrical nerve stimulation, TENS). Weak lasers that reduce pain and swelling (low-level laser therapy). Signals from your body that help you learn to regulate pain (biofeedback). Occupational therapy. This helps you to learn ways to function at home and work with less pain. Recreational therapy. This involves trying new  activities or hobbies, such as a physical activity or drawing. Mental health therapy, including: Cognitive behavioral therapy (CBT). This helps you learn coping skills for dealing with  pain. Acceptance and commitment therapy (ACT) to change the way you think and react to pain. Relaxation therapies, including muscle relaxation exercises and mindfulness-based stress reduction. Pain management counseling. This may be individual, family, or group counseling.  Receive medical treatments Medical treatments for pain management include: Nerve block injections. These may include a pain blocker and anti-inflammatory medicines. You may have injections: Near the spine to relieve chronic back or neck pain. Into joints to relieve back or joint pain. Into nerve areas that supply a painful area to relieve body pain. Into muscles (trigger point injections) to relieve some painful muscle conditions. A medical device placed near your spine to help block pain signals and relieve nerve pain or chronic back pain (spinal cord stimulation device). Acupuncture. Follow these instructions at home Medicines Take over-the-counter and prescription medicines only as told by your health care provider. If you are taking pain medicine, ask your health care providers about possible side effects to watch out for. Do not drive or use heavy machinery while taking prescription opioid pain medicine. Lifestyle  Do not use drugs or alcohol to reduce pain. If you drink alcohol, limit how much you have to: 0-1 drink a day for women who are not pregnant. 0-2 drinks a day for men. Know how much alcohol is in a drink. In the [place] one drink equals one 12 oz bottle of beer (355 mL), one 5 oz glass of wine (148 mL), or one 1 oz glass of hard liquor (44 mL). Do not use any products that contain nicotine or tobacco. These products include cigarettes, chewing tobacco, and vaping devices, such as e-cigarettes. If you need help quitting, ask your health care provider. Eat a healthy diet and maintain a healthy weight. Poor diet and excess weight may make pain worse. Eat foods that are high in fiber. These include fresh  fruits and vegetables, whole grains, and beans. Limit foods that are high in fat and processed sugars, such as fried and sweet foods. Exercise regularly. Exercise lowers stress and may help relieve pain. Ask your health care provider what activities and exercises are safe for you. If your health care provider approves, join an exercise class that combines movement and stress reduction. Examples include yoga and tai chi. Get enough sleep. Lack of sleep may make pain worse. Lower stress as much as possible. Practice stress reduction techniques as told by your therapist. General instructions Work with all your pain management providers to find the treatments that work best for you. You are an important member of your pain management team. There are many things you can do to reduce pain on your own. Consider joining an online or in-person support group for people who have chronic pain. Keep all follow-up visits. This is important. Where to find more information You can find more information about managing pain without opioids from: American Academy of Pain Medicine: painmed.org Institute for Chronic Pain: instituteforchronicpain.org American Chronic Pain Association: theacpa.org Contact a health care provider if: You have side effects from pain medicine. Your pain gets worse or does not get better with treatments or home therapy. You are struggling with anxiety or depression. Summary Many types of pain can be managed without opioids. Chronic pain may respond better to pain management without opioids. Pain is best managed when you and a team of health care providers  work together. Pain management without opioids may include non-opioid medicines, medical treatments, physical therapy, mental health therapy, and lifestyle changes. Tell your health care providers if your pain gets worse or is not being managed well enough. This information is not intended to replace advice given to you by your health  care provider. Make sure you discuss any questions you have with your health care provider. Document Revised: 08/17/2020 Document Reviewed: 08/17/2020 Elsevier Patient Education  2024 [employer].   Preventive Care 65 Years and Older, Male  Preventive care refers to lifestyle choices and visits with your health care provider that can promote health and wellness. What does preventive care include? A yearly physical exam. This is also called an annual well check. Dental exams once or twice a year. Routine eye exams. Ask your health care provider how often you should have your eyes checked. Personal lifestyle choices, including: Daily care of your teeth and gums. Regular physical activity. Eating a healthy diet. Avoiding tobacco and drug use. Limiting alcohol use. Practicing safe sex. Taking low doses of aspirin every day. Taking vitamin and mineral supplements as recommended by your health care provider. What happens during an annual well check? The services and screenings done by your health care provider during your annual well check will depend on your age, overall health, lifestyle risk factors, and family history of disease. Counseling  Your health care provider may ask you questions about your: Alcohol use. Tobacco use. Drug use. Emotional well-being. Home and relationship well-being. Sexual activity. Eating habits. History of falls. Memory and ability to understand (cognition). Work and work Astronomer. Screening  You may have the following tests or measurements: Height, weight, and BMI. Blood pressure. Lipid and cholesterol levels. These may be checked every 5 years, or more frequently if you are over 28 years old. Skin check. Lung cancer screening. You may have this screening every year starting at age 54 if you have a 30-pack-year history of smoking and currently smoke or have quit within the past 15 years. Fecal occult blood test (FOBT) of the stool. You may have  this test every year starting at age 36. Flexible sigmoidoscopy or colonoscopy. You may have a sigmoidoscopy every 5 years or a colonoscopy every 10 years starting at age 87. Prostate cancer screening. Recommendations will vary depending on your family history and other risks. Hepatitis C blood test. Hepatitis B blood test. Sexually transmitted disease (STD) testing. Diabetes screening. This is done by checking your blood sugar (glucose) after you have not eaten for a while (fasting). You may have this done every 1-3 years. Abdominal aortic aneurysm (AAA) screening. You may need this if you are a current or former smoker. Osteoporosis. You may be screened starting at age 21 if you are at high risk. Talk with your health care provider about your test results, treatment options, and if necessary, the need for more tests. Vaccines  Your health care provider may recommend certain vaccines, such as: Influenza vaccine. This is recommended every year. Tetanus, diphtheria, and acellular pertussis (Tdap, Td) vaccine. You may need a Td booster every 10 years. Zoster vaccine. You may need this after age 25. Pneumococcal 13-valent conjugate (PCV13) vaccine. One dose is recommended after age 16. Pneumococcal polysaccharide (PPSV23) vaccine. One dose is recommended after age 58. Talk to your health care provider about which screenings and vaccines you need and how often you need them. This information is not intended to replace advice given to you by your health care provider. Make  sure you discuss any questions you have with your health care provider. Document Released: 06/03/2015 Document Revised: 01/25/2016 Document Reviewed: 03/08/2015 Elsevier Interactive Patient Education  2017 [employer].  Fall Prevention in the Home Falls can cause injuries. They can happen to people of all ages. There are many things you can do to make your home safe and to help prevent falls. What can I do on the outside of my  home? Regularly fix the edges of walkways and driveways and fix any cracks. Remove anything that might make you trip as you walk through a door, such as a raised step or threshold. Trim any bushes or trees on the path to your home. Use bright outdoor lighting. Clear any walking paths of anything that might make someone trip, such as rocks or tools. Regularly check to see if handrails are loose or broken. Make sure that both sides of any steps have handrails. Any raised decks and porches should have guardrails on the edges. Have any leaves, snow, or ice cleared regularly. Use sand or salt on walking paths during winter. Clean up any spills in your garage right away. This includes oil or grease spills. What can I do in the bathroom? Use night lights. Install grab bars by the toilet and in the tub and shower. Do not use towel bars as grab bars. Use non-skid mats or decals in the tub or shower. If you need to sit down in the shower, use a plastic, non-slip stool. Keep the floor dry. Clean up any water that spills on the floor as soon as it happens. Remove soap buildup in the tub or shower regularly. Attach bath mats securely with double-sided non-slip rug tape. Do not have throw rugs and other things on the floor that can make you trip. What can I do in the bedroom? Use night lights. Make sure that you have a light by your bed that is easy to reach. Do not use any sheets or blankets that are too big for your bed. They should not hang down onto the floor. Have a firm chair that has side arms. You can use this for support while you get dressed. Do not have throw rugs and other things on the floor that can make you trip. What can I do in the kitchen? Clean up any spills right away. Avoid walking on wet floors. Keep items that you use a lot in easy-to-reach places. If you need to reach something above you, use a strong step stool that has a grab bar. Keep electrical cords out of the way. Do  not use floor polish or wax that makes floors slippery. If you must use wax, use non-skid floor wax. Do not have throw rugs and other things on the floor that can make you trip. What can I do with my stairs? Do not leave any items on the stairs. Make sure that there are handrails on both sides of the stairs and use them. Fix handrails that are broken or loose. Make sure that handrails are as long as the stairways. Check any carpeting to make sure that it is firmly attached to the stairs. Fix any carpet that is loose or worn. Avoid having throw rugs at the top or bottom of the stairs. If you do have throw rugs, attach them to the floor with carpet tape. Make sure that you have a light switch at the top of the stairs and the bottom of the stairs. If you do not have them, ask  someone to add them for you. What else can I do to help prevent falls? Wear shoes that: Do not have high heels. Have rubber bottoms. Are comfortable and fit you well. Are closed at the toe. Do not wear sandals. If you use a stepladder: Make sure that it is fully opened. Do not climb a closed stepladder. Make sure that both sides of the stepladder are locked into place. Ask someone to hold it for you, if possible. Clearly mark and make sure that you can see: Any grab bars or handrails. First and last steps. Where the edge of each step is. Use tools that help you move around (mobility aids) if they are needed. These include: Canes. Walkers. Scooters. Crutches. Turn on the lights when you go into a dark area. Replace any light bulbs as soon as they burn out. Set up your furniture so you have a clear path. Avoid moving your furniture around. If any of your floors are uneven, fix them. If there are any pets around you, be aware of where they are. Review your medicines with your doctor. Some medicines can make you feel dizzy. This can increase your chance of falling. Ask your doctor what other things that you can do to  help prevent falls. This information is not intended to replace advice given to you by your health care provider. Make sure you discuss any questions you have with your health care provider. Document Released: 03/03/2009 Document Revised: 10/13/2015 Document Reviewed: 06/11/2014 Elsevier Interactive Patient Education  2017 [employer].

## 2023-01-07 DIAGNOSIS — H532 Diplopia: Secondary | ICD-10-CM | POA: Diagnosis not present

## 2023-01-09 ENCOUNTER — Other Ambulatory Visit: Payer: Self-pay | Admitting: Family Medicine

## 2023-01-09 DIAGNOSIS — E1142 Type 2 diabetes mellitus with diabetic polyneuropathy: Secondary | ICD-10-CM

## 2023-01-10 DIAGNOSIS — M25511 Pain in right shoulder: Secondary | ICD-10-CM | POA: Diagnosis not present

## 2023-01-16 ENCOUNTER — Encounter: Payer: Self-pay | Admitting: Family Medicine

## 2023-01-16 ENCOUNTER — Ambulatory Visit (INDEPENDENT_AMBULATORY_CARE_PROVIDER_SITE_OTHER): Payer: PPO | Admitting: Family Medicine

## 2023-01-16 VITALS — BP 124/74 | HR 97 | Temp 98.1°F | Ht 70.0 in | Wt 160.2 lb

## 2023-01-16 DIAGNOSIS — I499 Cardiac arrhythmia, unspecified: Secondary | ICD-10-CM

## 2023-01-16 DIAGNOSIS — Z7985 Long-term (current) use of injectable non-insulin antidiabetic drugs: Secondary | ICD-10-CM

## 2023-01-16 DIAGNOSIS — Z720 Tobacco use: Secondary | ICD-10-CM

## 2023-01-16 DIAGNOSIS — E1142 Type 2 diabetes mellitus with diabetic polyneuropathy: Secondary | ICD-10-CM | POA: Diagnosis not present

## 2023-01-16 DIAGNOSIS — R634 Abnormal weight loss: Secondary | ICD-10-CM

## 2023-01-16 DIAGNOSIS — Z7984 Long term (current) use of oral hypoglycemic drugs: Secondary | ICD-10-CM

## 2023-01-16 LAB — BASIC METABOLIC PANEL
BUN: 43 mg/dL — ABNORMAL HIGH (ref 6–23)
CO2: 28 mEq/L (ref 19–32)
Calcium: 9.6 mg/dL (ref 8.4–10.5)
Chloride: 98 mEq/L (ref 96–112)
Creatinine, Ser: 1.37 mg/dL (ref 0.40–1.50)
GFR: 47.89 mL/min — ABNORMAL LOW (ref >60.00)
Glucose, Bld: 232 mg/dL — ABNORMAL HIGH (ref 70–99)
Potassium: 4.3 mEq/L (ref 3.5–5.1)
Sodium: 134 mEq/L — ABNORMAL LOW (ref 135–145)

## 2023-01-16 LAB — TSH: TSH: 0.59 u[IU]/mL (ref 0.35–5.50)

## 2023-01-16 NOTE — Assessment & Plan Note (Signed)
Chronic issue.  Weight has trended up with stopping Ozempic.  I suspect that was the cause of his weight loss.  We will continue to monitor.

## 2023-01-16 NOTE — Assessment & Plan Note (Addendum)
Noted on exam today.  EKG checked revealing PVCs.  Check labs to determine underlying cause of PVCs.

## 2023-01-16 NOTE — Assessment & Plan Note (Signed)
Chronic issue.  Sugars have trended up some since coming off of Ozempic.  I do think in general they are relatively adequate for his age.  He will continue Farxiga 10 mg daily, Tradjenta 5 mg daily, and metformin 500 mg twice daily.

## 2023-01-16 NOTE — Progress Notes (Signed)
[name], MD Phone: [phone]  James Hood is a 82 y.o. male who presents today for follow-up.  Diabetes: Patient is on Farxiga, Tradjenta, and metformin.  He has been off of Ozempic for about 6 weeks.  His 90-day average glucose is 131.  His 30-day average glucose is 145.  Over the last 90 days he is 12% above range though over the last 30 days he is 20% above range.  Weight loss: Patient's weight has increased since coming off of Ozempic.  He eats lots.  He has eggs, biscuits, and gravy for breakfast most days.  He will have sandwich for lunch.  Patient's wife reports that he breathes out somewhat like a sigh at times.  She is concerned this could be related to an oxygen issue.  Patient does smoke.  Patient questions whether or not he has a blockage in one of his arteries seen on prior imaging.  CT chest abdomen and pelvis from 2018 reviewed that did not reveal any thing other than aortic atherosclerosis.  MRI lumbar spine from 2018 did not mention any vascular concerns.  Chest x-ray from a month ago did reveal aortic atherosclerosis.  Discussed that this is not a blockage and we are already treating risk factors.    Social History   Tobacco Use  Smoking Status Every Day   Current packs/day: 0.50   Average packs/day: 0.5 packs/day for 60.0 years (30.0 ttl pk-yrs)   Types: Cigarettes  Smokeless Tobacco Never    Current Outpatient Medications on File Prior to Visit  Medication Sig Dispense Refill   aspirin EC 81 MG tablet Take 81 mg by mouth daily.     buPROPion (WELLBUTRIN XL) 300 MG 24 hr tablet Take 1 tablet (300 mg total) by mouth daily. 90 tablet 1   cholestyramine (QUESTRAN) 4 g packet Take 1 packet by mouth 2 (two) times daily. (Patient not taking: Reported on 12/31/2022)     ciclopirox (PENLAC) 8 % solution Apply topically at bedtime. Apply over nail and surrounding skin. Apply daily over previous coat. After seven (7) days, may remove with alcohol and continue  cycle. (Patient not taking: Reported on 12/31/2022) 6.6 mL 0   Continuous Blood Gluc Sensor (FREESTYLE LIBRE 14 DAY SENSOR) MISC APPLY 1 SENSOR EVERY 14 DAYS TO MONITOR BLOOD GLUCOSE LEVELS. REMOVE OLD SENSOR BEFORE APPLYING NEW SENSOR 2 each 5   Continuous Glucose Sensor (FREESTYLE LIBRE 14 DAY SENSOR) MISC APPLY SENSOR EVERY 14 DAYS TO MONITOR BLOOD GLUCOSE LEVELS. REMOVE OLD SENSOR BEFORE APPLYING NEW SENSOR 2 each 5   dapagliflozin propanediol (FARXIGA) 10 MG TABS tablet Take 1 tablet (10 mg total) by mouth daily. 90 tablet 3   diphenoxylate-atropine (LOMOTIL) 2.5-0.025 MG tablet Take by mouth.     divalproex (DEPAKOTE ER) 500 MG 24 hr tablet Take 1 tablet by mouth 2 (two) times daily.     donepezil (ARICEPT) 10 MG tablet Take 10 mg by mouth at bedtime.     DULoxetine (CYMBALTA) 30 MG capsule Take 2 capsules (60 mg total) by mouth daily. 60 capsule 3   empagliflozin (JARDIANCE) 10 MG TABS tablet Take 1 tablet by mouth daily.     fenofibrate (TRICOR) 145 MG tablet TAKE 1 TABLET BY MOUTH DAILY 90 tablet 1   gabapentin (NEURONTIN) 600 MG tablet TAKE TWO TABLETS BY MOUTH THREE TIMES A DAY 180 tablet 1   lidocaine (LIDODERM) 5 % Place 1 patch onto the skin every 12 (twelve) hours. Remove & Discard patch within 12  hours or as directed by MD 30 patch 2   linagliptin (TRADJENTA) 5 MG TABS tablet Take 1 tablet (5 mg total) by mouth daily. 30 tablet 5   lisinopril-hydrochlorothiazide (ZESTORETIC) 20-25 MG tablet TAKE 1 TABLET BY MOUTH TWICE A DAY 180 tablet 1   memantine (NAMENDA) 10 MG tablet Take 1 tablet by mouth daily.     metFORMIN (GLUCOPHAGE-XR) 500 MG 24 hr tablet TAKE 1 TABLET BY MOUTH TWICE A DAY 180 tablet 1   metoprolol succinate (TOPROL-XL) 25 MG 24 hr tablet Take 25 mg by mouth daily.     Multiple Vitamins-Minerals (CENTRUM SILVER PO) Take 1 tablet by mouth daily.     omega-3 acid ethyl esters (LOVAZA) 1 g capsule TAKE 2 CAPSULES BY MOUTH TWICE A DAY 360 capsule 3   omeprazole (PRILOSEC) 20  MG capsule Take 20 mg by mouth daily.     oxyCODONE-acetaminophen (PERCOCET) 10-325 MG tablet Take 1 tablet by mouth every 6 (six) hours as needed for pain. Must last 30 days. 120 tablet 0   [START ON 01/27/2023] oxyCODONE-acetaminophen (PERCOCET) 10-325 MG tablet Take 1 tablet by mouth every 6 (six) hours as needed for pain. Must last 30 days. 120 tablet 0   [START ON 02/26/2023] oxyCODONE-acetaminophen (PERCOCET) 10-325 MG tablet Take 1 tablet by mouth every 6 (six) hours as needed for pain. Must last 30 days. 120 tablet 0   pantoprazole (PROTONIX) 40 MG tablet Take 40 mg by mouth daily.     pramipexole (MIRAPEX) 0.125 MG tablet TAKE 1 TABLET BY MOUTH EVERY NIGHT 2-3 HOURS BEFORE BEDTIME 90 tablet 1   primidone (MYSOLINE) 50 MG tablet Take 150-250 mg by mouth 2 (two) times daily. Take 250 mg by mouth in the morning & take 150 mg at night.     QUEtiapine (SEROQUEL) 25 MG tablet Take 75 mg by mouth at bedtime.     rosuvastatin (CRESTOR) 40 MG tablet TAKE ONE TABLET BY MOUTH DAILY 90 tablet 3   tamsulosin (FLOMAX) 0.4 MG CAPS capsule TAKE 2 CAPSULES BY MOUTH DAILY AFTER SUPPER 180 capsule 0   traZODone (DESYREL) 100 MG tablet Take 2 tablets (200 mg total) by mouth at bedtime as needed for sleep. 180 tablet 0   vitamin C (ASCORBIC ACID) 500 MG tablet Take 1,000 mg by mouth daily.     No current facility-administered medications on file prior to visit.     ROS see history of present illness  Objective  Physical Exam Vitals:   01/16/23 0951  BP: 124/74  Pulse: 97  Temp: 98.1 F (36.7 C)  SpO2: 93%    BP Readings from Last 3 Encounters:  01/16/23 124/74  12/20/22 (!) 140/79  12/04/22 116/76   Wt Readings from Last 3 Encounters:  01/16/23 160 lb 3.2 oz (72.7 kg)  12/31/22 160 lb (72.6 kg)  12/20/22 155 lb (70.3 kg)    Physical Exam Constitutional:      General: He is not in acute distress.    Appearance: He is not diaphoretic.  Cardiovascular:     Rate and Rhythm: Normal rate.  Rhythm irregular.     Heart sounds: Normal heart sounds.  Pulmonary:     Effort: Pulmonary effort is normal.     Breath sounds: Normal breath sounds.  Skin:    General: Skin is warm and dry.  Neurological:     Mental Status: He is alert.    EKG: Sinus rhythm with occasional PVC, no ischemic changes, rate 89  Assessment/Plan: Please  see individual problem list.  Type 2 diabetes mellitus with diabetic polyneuropathy, without long-term current use of insulin (HCC) Assessment & Plan: Chronic issue.  Sugars have trended up some since coming off of Ozempic.  I do think in general they are relatively adequate for his age.  He will continue Farxiga 10 mg daily, Tradjenta 5 mg daily, and metformin 500 mg twice daily.   Weight loss Assessment & Plan: Chronic issue.  Weight has trended up with stopping Ozempic.  I suspect that was the cause of his weight loss.  We will continue to monitor.   Irregular heart beat Assessment & Plan: Noted on exam today.  EKG checked revealing PVCs.  Check labs to determine underlying cause of PVCs.  Orders: -     EKG 12-Lead -     Basic metabolic panel -     TSH  Tobacco abuse Assessment & Plan: Chronic issue.  Discussed that underlying COPD or emphysema may contribute to his need to breathe out like a sigh.  He has no shortness of breath.  Discussed we would monitor.  He notes he is not ready to quit smoking.    Discussed with patient that I did not identify any concerning blockages on the imaging that I reviewed.  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  Return in about 2 months (around 03/18/2023) for Diabetes follow-up with A1c at time of visit.   [name], MD [facility]

## 2023-01-16 NOTE — Assessment & Plan Note (Signed)
Chronic issue.  Discussed that underlying COPD or emphysema may contribute to his need to breathe out like a sigh.  He has no shortness of breath.  Discussed we would monitor.  He notes he is not ready to quit smoking.

## 2023-01-16 NOTE — Patient Instructions (Signed)
Nice to see you. Will get lab work today and contact you with the results. 

## 2023-01-18 ENCOUNTER — Ambulatory Visit
Admission: RE | Admit: 2023-01-18 | Discharge: 2023-01-18 | Disposition: A | Discharge status: Home / Self Care | Payer: PPO | Attending: Gastroenterology | Admitting: Gastroenterology

## 2023-01-18 ENCOUNTER — Encounter: Payer: Self-pay | Admitting: Gastroenterology

## 2023-01-18 ENCOUNTER — Encounter
Admission: RE | Disposition: A | Discharge status: Home / Self Care | Payer: Self-pay | Source: Home / Self Care | Attending: Gastroenterology

## 2023-01-18 ENCOUNTER — Ambulatory Visit: Payer: PPO | Admitting: Anesthesiology

## 2023-01-18 DIAGNOSIS — K209 Esophagitis, unspecified without bleeding: Secondary | ICD-10-CM | POA: Insufficient documentation

## 2023-01-18 DIAGNOSIS — E7800 Pure hypercholesterolemia, unspecified: Secondary | ICD-10-CM | POA: Diagnosis not present

## 2023-01-18 DIAGNOSIS — I10 Essential (primary) hypertension: Secondary | ICD-10-CM | POA: Diagnosis not present

## 2023-01-18 DIAGNOSIS — K219 Gastro-esophageal reflux disease without esophagitis: Secondary | ICD-10-CM | POA: Insufficient documentation

## 2023-01-18 DIAGNOSIS — F0390 Unspecified dementia without behavioral disturbance: Secondary | ICD-10-CM | POA: Diagnosis not present

## 2023-01-18 DIAGNOSIS — E119 Type 2 diabetes mellitus without complications: Secondary | ICD-10-CM | POA: Insufficient documentation

## 2023-01-18 DIAGNOSIS — Z9889 Other specified postprocedural states: Secondary | ICD-10-CM | POA: Insufficient documentation

## 2023-01-18 DIAGNOSIS — R1013 Epigastric pain: Secondary | ICD-10-CM | POA: Insufficient documentation

## 2023-01-18 DIAGNOSIS — F17200 Nicotine dependence, unspecified, uncomplicated: Secondary | ICD-10-CM | POA: Insufficient documentation

## 2023-01-18 DIAGNOSIS — Z7984 Long term (current) use of oral hypoglycemic drugs: Secondary | ICD-10-CM | POA: Diagnosis not present

## 2023-01-18 DIAGNOSIS — K31A19 Gastric intestinal metaplasia without dysplasia, unspecified site: Secondary | ICD-10-CM | POA: Diagnosis not present

## 2023-01-18 DIAGNOSIS — Z8719 Personal history of other diseases of the digestive system: Secondary | ICD-10-CM | POA: Insufficient documentation

## 2023-01-18 DIAGNOSIS — R1310 Dysphagia, unspecified: Secondary | ICD-10-CM | POA: Insufficient documentation

## 2023-01-18 DIAGNOSIS — N400 Enlarged prostate without lower urinary tract symptoms: Secondary | ICD-10-CM | POA: Insufficient documentation

## 2023-01-18 DIAGNOSIS — F419 Anxiety disorder, unspecified: Secondary | ICD-10-CM | POA: Diagnosis not present

## 2023-01-18 DIAGNOSIS — K224 Dyskinesia of esophagus: Secondary | ICD-10-CM | POA: Insufficient documentation

## 2023-01-18 DIAGNOSIS — Z79899 Other long term (current) drug therapy: Secondary | ICD-10-CM | POA: Diagnosis not present

## 2023-01-18 DIAGNOSIS — K3189 Other diseases of stomach and duodenum: Secondary | ICD-10-CM | POA: Diagnosis not present

## 2023-01-18 DIAGNOSIS — K2980 Duodenitis without bleeding: Secondary | ICD-10-CM | POA: Insufficient documentation

## 2023-01-18 DIAGNOSIS — K449 Diaphragmatic hernia without obstruction or gangrene: Secondary | ICD-10-CM | POA: Diagnosis not present

## 2023-01-18 DIAGNOSIS — F17210 Nicotine dependence, cigarettes, uncomplicated: Secondary | ICD-10-CM | POA: Diagnosis not present

## 2023-01-18 HISTORY — PX: BIOPSY: SHX5522

## 2023-01-18 HISTORY — PX: ESOPHAGOGASTRODUODENOSCOPY (EGD) WITH PROPOFOL: SHX5813

## 2023-01-18 HISTORY — PX: MALONEY DILATION: SHX5535

## 2023-01-18 LAB — GLUCOSE, CAPILLARY: Glucose-Capillary: 156 mg/dL — ABNORMAL HIGH (ref 70–99)

## 2023-01-18 SURGERY — ESOPHAGOGASTRODUODENOSCOPY (EGD) WITH PROPOFOL
Anesthesia: General

## 2023-01-18 MED ORDER — SODIUM CHLORIDE 0.9 % IV SOLN
INTRAVENOUS | Status: DC
  Administered 2023-01-18: 20 mL/h via INTRAVENOUS

## 2023-01-18 MED ORDER — PROPOFOL 10 MG/ML IV BOLUS
INTRAVENOUS | Status: DC | PRN
  Administered 2023-01-18 (×3): 20 mg via INTRAVENOUS
  Administered 2023-01-18: 10 mg via INTRAVENOUS
  Administered 2023-01-18: 70 mg via INTRAVENOUS
  Administered 2023-01-18: 20 mg via INTRAVENOUS

## 2023-01-18 MED ORDER — DEXMEDETOMIDINE HCL IN NACL 80 MCG/20ML IV SOLN
INTRAVENOUS | Status: DC | PRN
Start: 2023-01-18 — End: 2023-01-18
  Administered 2023-01-18: 8 ug via INTRAVENOUS

## 2023-01-18 MED ORDER — LIDOCAINE HCL (CARDIAC) PF 100 MG/5ML IV SOSY
PREFILLED_SYRINGE | INTRAVENOUS | Status: DC | PRN
  Administered 2023-01-18: 50 mg via INTRAVENOUS

## 2023-01-18 NOTE — Op Note (Addendum)
[facility] Gastroenterology Patient Name: James Hood Procedure Date: 01/18/2023 8:14 AM MRN: [id number] Account #: [id number] Date of Birth: May 03, 1941 Admit Type: Outpatient Age: 82 Room: [facility] ENDO ROOM 1 Gender: Male Note Status: Finalized Instrument Name: Upper Endoscope [id number] Procedure:             Upper GI endoscopy Indications:           Dysphagia Providers:             Trenda Moots, DO Referring MD:          Yehuda Mao. Birdie Sons (Referring MD) Medicines:             Monitored Anesthesia Care Complications:         No immediate complications. Estimated blood loss:                         Minimal. Procedure:             Pre-Anesthesia Assessment:                        - Prior to the procedure, a History and Physical was                         performed, and patient medications and allergies were                         reviewed. The patient is competent. The risks and                         benefits of the procedure and the sedation options and                         risks were discussed with the patient. All questions                         were answered and informed consent was obtained.                         Patient identification and proposed procedure were                         verified by the physician, the nurse, the anesthetist                         and the technician in the endoscopy suite. Mental                         Status Examination: alert and oriented. Airway                         Examination: normal oropharyngeal airway and neck                         mobility. Respiratory Examination: clear to                         auscultation. CV Examination: RRR, no murmurs, no S3  or S4. Prophylactic Antibiotics: The patient does not                         require prophylactic antibiotics. Prior                         Anticoagulants: The patient has taken no anticoagulant                          or antiplatelet agents. ASA Grade Assessment: III - A                         patient with severe systemic disease. After reviewing                         the risks and benefits, the patient was deemed in                         satisfactory condition to undergo the procedure. The                         anesthesia plan was to use monitored anesthesia care                         (MAC). Immediately prior to administration of                         medications, the patient was re-assessed for adequacy                         to receive sedatives. The heart rate, respiratory                         rate, oxygen saturations, blood pressure, adequacy of                         pulmonary ventilation, and response to care were                         monitored throughout the procedure. The physical                         status of the patient was re-assessed after the                         procedure.                        After obtaining informed consent, the endoscope was                         passed under direct vision. Throughout the procedure,                         the patient's blood pressure, pulse, and oxygen                         saturations were monitored continuously. The Endoscope  was introduced through the mouth, and advanced to the                         second part of duodenum. The upper GI endoscopy was                         accomplished without difficulty. The patient tolerated                         the procedure well. Findings:      Food (residue) was found in the duodenal bulb. Estimated blood loss:       none.      Localized moderate inflammation characterized by erythema was found in       the duodenal bulb. Biopsies were taken with a cold forceps for       histology. Estimated blood loss was minimal.      A medium amount of food (residue) was found on the greater curvature of       the stomach. Estimated blood loss: none. Impeded  visualization of this       part of the gastric body.      The exam of the stomach was otherwise normal.      Esophagogastric landmarks were identified: the gastroesophageal junction       was found at 36 cm from the incisors.      The Z-line was variable. Mucosa was biopsied with a cold forceps for       histology. One specimen bottle was sent to pathology. Taken in 4       quadrant fashion. Estimated blood loss was minimal.      Abnormal motility was noted in the esophagus. The cricopharyngeus was       normal. There is spasticity of the esophageal body. The distal       esophagus/lower esophageal sphincter is spastic, but gives up passage to       the endoscope. Tertiary peristaltic waves are noted. Estimated blood       loss: none. The scope was withdrawn. Dilation was performed with a       Maloney dilator with no resistance at 46 Fr, 48 Fr and 50 Fr. The       dilation site was examined following endoscope reinsertion and showed no       change- reinspected after each dilation. Above noted barretts biopsies       performed after dilation.      Non-severe esophagitis with no bleeding was found. Estimated blood loss:       none. Impression:            - Retained food in the duodenum.                        - Duodenitis. Biopsied.                        - A medium amount of food (residue) in the stomach.                        - Esophagogastric landmarks identified.                        - Z-line variable. Biopsied.                        -  Abnormal esophageal motility. Dilated.                        - Non-severe esophagitis with no bleeding. Recommendation:        - Patient has a contact number available for                         emergencies. The signs and symptoms of potential                         delayed complications were discussed with the patient.                         Return to normal activities tomorrow. Written                         discharge instructions were  provided to the patient.                        - Discharge patient to home.                        - Resume previous diet.                        - Continue present medications.                        - increase pron pump inhibitor to 40 mg twice a day                         for 8 weeks to assess for improvement.                        Discontinue GLP-1 like medications if possible                        - Await pathology results.                        - Repeat upper endoscopy for surveillance based on                         pathology results.                        - Return to GI office as previously scheduled.                        - No aspirin, ibuprofen, naproxen, or other                         non-steroidal anti-inflammatory drugs.                        - The findings and recommendations were discussed with                         the patient. Procedure Code(s):     --- Professional ---  09811, Esophagogastroduodenoscopy, flexible,                         transoral; with biopsy, single or multiple                        43450, Dilation of esophagus, by unguided sound or                         bougie, single or multiple passes Diagnosis Code(s):     --- Professional ---                        K29.80, Duodenitis without bleeding                        K22.89, Other specified disease of esophagus                        K22.4, Dyskinesia of esophagus                        K20.90, Esophagitis, unspecified without bleeding                        R13.10, Dysphagia, unspecified CPT copyright 2022 [employer]. All rights reserved. The codes documented in this report are preliminary and upon coder review may  be revised to meet current compliance requirements. Attending Participation:      I personally performed the entire procedure. Elfredia Nevins, DO [name] DO, DO 01/18/2023 9:17:43 AM This report has been signed  electronically. Number of Addenda: 0 Note Initiated On: 01/18/2023 8:14 AM Estimated Blood Loss:  Estimated blood loss was minimal.      [facility]

## 2023-01-18 NOTE — Transfer of Care (Signed)
Immediate Anesthesia Transfer of Care Note  Patient: James Hood  Procedure(s) Performed: ESOPHAGOGASTRODUODENOSCOPY (EGD) WITH PROPOFOL BIOPSY MALONEY DILATION  Patient Location: PACU and Endoscopy Unit  Anesthesia Type:General  Level of Consciousness: drowsy and patient cooperative  Airway & Oxygen Therapy: Patient Spontanous Breathing  Post-op Assessment: Report given to RN and Post -op Vital signs reviewed and stable  Post vital signs: Reviewed and stable  Last Vitals:  Vitals Value Taken Time  BP 90/49 01/18/23 0914  Temp 36.4 C 01/18/23 0913  Pulse 76 01/18/23 0916  Resp 17 01/18/23 0916  SpO2 92 % 01/18/23 0916  Vitals shown include unfiled device data.  Last Pain:  Vitals:   01/18/23 0913  TempSrc: Temporal  PainSc: 0-No pain         Complications: No notable events documented.

## 2023-01-18 NOTE — Interval H&P Note (Signed)
History and Physical Interval Note: Preprocedure H&P from 01/18/23  was reviewed and there was no interval change after seeing and examining the patient.  Written consent was obtained from the patient after discussion of risks, benefits, and alternatives. Patient has consented to proceed with Esophagogastroduodenoscopy with possible intervention   01/18/2023 2:44 PM  James Hood  has presented today for surgery, with the diagnosis of 789.06 (ICD-9-CM) - R10.13 (ICD-10-CM) - Epigastric pain 787.20 (ICD-9-CM) - R13.10 (ICD-10-CM) - Dysphagia, unspecified type.  The various methods of treatment have been discussed with the patient and family. After consideration of risks, benefits and other options for treatment, the patient has consented to  Procedure(s): ESOPHAGOGASTRODUODENOSCOPY (EGD) WITH PROPOFOL (N/A) BIOPSY MALONEY DILATION as a surgical intervention.  The patient's history has been reviewed, patient examined, no change in status, stable for surgery.  I have reviewed the patient's chart and labs.  Questions were answered to the patient's satisfaction.     [name]

## 2023-01-18 NOTE — Anesthesia Preprocedure Evaluation (Signed)
Anesthesia Evaluation  Patient identified by MRN, date of birth, ID band Patient awake    Reviewed: Allergy & Precautions, NPO status , Patient's Chart, lab work & pertinent test results  History of Anesthesia Complications Negative for: history of anesthetic complications  Airway Mallampati: II  TM Distance: >3 FB Neck ROM: Full    Dental  (+) Poor Dentition, Missing, Dental Advidsory Given   Pulmonary neg shortness of breath, neg sleep apnea, neg COPD, neg recent URI, Current Smoker and Patient abstained from smoking.   breath sounds clear to auscultation- rhonchi (-) wheezing      Cardiovascular Exercise Tolerance: Good hypertension, Pt. on medications (-) angina (-) CAD, (-) Past MI, (-) Cardiac Stents and (-) CABG + dysrhythmias (-) Valvular Problems/Murmurs Rhythm:Regular Rate:Normal - Systolic murmurs and - Diastolic murmurs    Neuro/Psych  Headaches, neg Seizures PSYCHIATRIC DISORDERS Anxiety Depression   Dementia    GI/Hepatic Neg liver ROS, hiatal hernia, PUD,GERD  ,,  Endo/Other  diabetes, Oral Hypoglycemic Agents    Renal/GU negative Renal ROS     Musculoskeletal  (+) Arthritis ,    Abdominal  (+) - obese  Peds  Hematology  (+) Blood dyscrasia, anemia   Anesthesia Other Findings Past Medical History: No date: Anxiety No date: Anxiety and depression No date: Arthritis No date: BPH (benign prostatic hyperplasia) 07/2017: Chronic bilateral low back pain with left-sided sciatica No date: Colon polyps No date: Dementia (HCC) No date: Depression No date: Diabetes mellitus type 2 in nonobese (HCC) No date: GERD (gastroesophageal reflux disease)     Comment:  BARRETTS ESOPHAGUS RESOLVED PER PATIENT No date: Headache No date: 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HCC) No date: History of hiatal hernia No date: Hypercholesteremia No date: Hypertension No date: Hyperthyroidism No date: Neuropathic pain No date: Primary  localized osteoarthritis of right knee 08/26/2017: S/P insertion of spinal cord stimulator No date: Stomach ulcer No date: Tremor, essential 04/02/2018: Wound infection complicating hardware (HCC)   Reproductive/Obstetrics                             Anesthesia Physical Anesthesia Plan  ASA: 3  Anesthesia Plan: General   Post-op Pain Management:    Induction: Intravenous  PONV Risk Score and Plan: 1 and Propofol infusion and TIVA  Airway Management Planned: Natural Airway and Nasal Cannula  Additional Equipment:   Intra-op Plan:   Post-operative Plan:   Informed Consent: I have reviewed the patients History and Physical, chart, labs and discussed the procedure including the risks, benefits and alternatives for the proposed anesthesia with the patient or authorized representative who has indicated his/her understanding and acceptance.     Dental advisory given  Plan Discussed with: CRNA and Anesthesiologist  Anesthesia Plan Comments:         Anesthesia Quick Evaluation

## 2023-01-18 NOTE — H&P (Signed)
Pre-Procedure H&P   Patient ID: James Hood is a 82 y.o. male.  Gastroenterology Provider: [name], DO  Referring Provider: Tawni Pummel, PA PCP: Glori Luis, MD  Date: 01/18/2023  HPI James Hood is a 82 y.o. male who presents today for Esophagogastroduodenoscopy for epigastric pain, dysphagia, Barretts - h/o high grade dysplasia .  Pt with c/o epigastric pain and dysphagia. The dysphagia has developed over the last year or two and has occurred a few times.   History of Barrett's esophagus with high-grade dysplasia (2013) status post ablation with Dr. Enrigue Catena in 2015.  He last underwent EGD in July 2022 with an irregular Z-line that was negative on biopsy.  A duodenal diverticulum was noted as well.  Previously taking large amounts of aspirin up to 2000 mg a day, but has since stopped.  Active tobacco use. Patient currently on Protonix. No longer taking ozempic. 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   Colon polyps    Dementia (HCC)    Depression    Diabetes mellitus type 2 in nonobese (HCC)    GERD (gastroesophageal reflux disease)    BARRETTS ESOPHAGUS RESOLVED PER PATIENT   Headache    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HCC)    History of hiatal hernia    Hypercholesteremia    Hypertension    Hyperthyroidism    Neuropathic pain    Primary localized osteoarthritis of right knee    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   Stomach ulcer    Tremor, essential    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/02/2018    Past Surgical History:  Procedure Laterality Date   ANKLE ARTHROSCOPY Left 09/29/2019   Procedure: LEFT ANKLE ARTHROSCOPY, DEBRIDEMENT;  Surgeon: Nadara Mustard, MD;  Location:  [facility];  Service:  Orthopedics;  Laterality: Left;   BACK SURGERY     COLONOSCOPY WITH PROPOFOL N/A 09/14/2016   Procedure: COLONOSCOPY WITH PROPOFOL;  Surgeon: Scot Jun, MD;  Location: [facility] ENDOSCOPY;  Service: Endoscopy;  Laterality: N/A;   esophageal stretch     ESOPHAGOGASTRODUODENOSCOPY (EGD) WITH PROPOFOL N/A 12/13/2020   Procedure: ESOPHAGOGASTRODUODENOSCOPY (EGD) WITH PROPOFOL;  Surgeon: Regis Bill, MD;  Location: [facility] ENDOSCOPY;  Service: Endoscopy;  Laterality: N/A;  IDDM   JOINT REPLACEMENT Right 2016   knee   LUMBAR LAMINECTOMY/DECOMPRESSION MICRODISCECTOMY Left 02/03/2016   Procedure: LEFT L5-S1 DISKECTOMY;  Surgeon: Hilda Lias, MD;  Location: [facility] NEURO ORS;  Service: Neurosurgery;  Laterality: Left;  LEFT L5-S1 DISKECTOMY   PULSE GENERATOR IMPLANT N/A 08/21/2017   Procedure: UNILATERAL PULSE GENERATOR IMPLANT;  Surgeon: Venetia Night, MD;  Location: [facility] ORS;  Service: Neurosurgery;  Laterality: N/A;   PULSE GENERATOR IMPLANT Right 04/02/2018   Procedure: REMOVAL OF PULSE GENERATOR IMPLANT AND LEADS;  Surgeon: Venetia Night, MD;  Location: [facility] ORS;  Service: Neurosurgery;  Laterality: Right;   TONSILLECTOMY     TOTAL KNEE ARTHROPLASTY Right 11/15/2014   Procedure: TOTAL KNEE ARTHROPLASTY;  Surgeon: Salvatore Marvel, MD;  Location: [facility] OR;  Service: Orthopedics;  Laterality: Right;    Family History No h/o GI disease or malignancy  Review of Systems  Constitutional:  Negative for activity change, appetite change, chills, diaphoresis, fatigue, fever and unexpected  weight change.  HENT:  Positive for trouble swallowing. Negative for voice change.   Respiratory:  Negative for shortness of breath and wheezing.   Cardiovascular:  Negative for chest pain, palpitations and leg swelling.  Gastrointestinal:  Positive for abdominal pain. Negative for abdominal distention, anal bleeding, blood in stool, constipation, diarrhea, nausea and vomiting.  Musculoskeletal:  Negative for  arthralgias and myalgias.  Skin:  Negative for color change and pallor.  Neurological:  Negative for dizziness, syncope and weakness.  Psychiatric/Behavioral:  Negative for confusion. The patient is not nervous/anxious.   All other systems reviewed and are negative.    Medications No current facility-administered medications on file prior to encounter.   Current Outpatient Medications on File Prior to Encounter  Medication Sig Dispense Refill   aspirin EC 81 MG tablet Take 81 mg by mouth daily.     buPROPion (WELLBUTRIN XL) 300 MG 24 hr tablet Take 1 tablet (300 mg total) by mouth daily. 90 tablet 1   Continuous Blood Gluc Sensor (FREESTYLE LIBRE 14 DAY SENSOR) MISC APPLY 1 SENSOR EVERY 14 DAYS TO MONITOR BLOOD GLUCOSE LEVELS. REMOVE OLD SENSOR BEFORE APPLYING NEW SENSOR 2 each 5   dapagliflozin propanediol (FARXIGA) 10 MG TABS tablet Take 1 tablet (10 mg total) by mouth daily. 90 tablet 3   diphenoxylate-atropine (LOMOTIL) 2.5-0.025 MG tablet Take by mouth.     donepezil (ARICEPT) 10 MG tablet Take 10 mg by mouth at bedtime.     gabapentin (NEURONTIN) 600 MG tablet TAKE TWO TABLETS BY MOUTH THREE TIMES A DAY 180 tablet 1   lidocaine (LIDODERM) 5 % Place 1 patch onto the skin every 12 (twelve) hours. Remove & Discard patch within 12 hours or as directed by MD 30 patch 2   lisinopril-hydrochlorothiazide (ZESTORETIC) 20-25 MG tablet TAKE 1 TABLET BY MOUTH TWICE A DAY 180 tablet 1   memantine (NAMENDA) 10 MG tablet Take 1 tablet by mouth daily.     metoprolol succinate (TOPROL-XL) 25 MG 24 hr tablet Take 25 mg by mouth daily.     Multiple Vitamins-Minerals (CENTRUM SILVER PO) Take 1 tablet by mouth daily.     omeprazole (PRILOSEC) 20 MG capsule Take 20 mg by mouth daily.     pantoprazole (PROTONIX) 40 MG tablet Take 40 mg by mouth daily.     primidone (MYSOLINE) 50 MG tablet Take 150-250 mg by mouth 2 (two) times daily. Take 250 mg by mouth in the morning & take 150 mg at night.      QUEtiapine (SEROQUEL) 25 MG tablet Take 75 mg by mouth at bedtime.     rosuvastatin (CRESTOR) 40 MG tablet TAKE ONE TABLET BY MOUTH DAILY 90 tablet 3   tamsulosin (FLOMAX) 0.4 MG CAPS capsule TAKE 2 CAPSULES BY MOUTH DAILY AFTER SUPPER 180 capsule 0   vitamin C (ASCORBIC ACID) 500 MG tablet Take 1,000 mg by mouth daily.     cholestyramine (QUESTRAN) 4 g packet Take 1 packet by mouth 2 (two) times daily. (Patient not taking: Reported on 12/31/2022)     ciclopirox (PENLAC) 8 % solution Apply topically at bedtime. Apply over nail and surrounding skin. Apply daily over previous coat. After seven (7) days, may remove with alcohol and continue cycle. (Patient not taking: Reported on 12/31/2022) 6.6 mL 0   divalproex (DEPAKOTE ER) 500 MG 24 hr tablet Take 1 tablet by mouth 2 (two) times daily.      Pertinent medications related to GI and procedure were reviewed by me with  the patient prior to the procedure  No current facility-administered medications for this encounter.  Current Outpatient Medications:    aspirin EC 81 MG tablet, Take 81 mg by mouth daily., Disp: , Rfl:    buPROPion (WELLBUTRIN XL) 300 MG 24 hr tablet, Take 1 tablet (300 mg total) by mouth daily., Disp: 90 tablet, Rfl: 1   Continuous Blood Gluc Sensor (FREESTYLE LIBRE 14 DAY SENSOR) MISC, APPLY 1 SENSOR EVERY 14 DAYS TO MONITOR BLOOD GLUCOSE LEVELS. REMOVE OLD SENSOR BEFORE APPLYING NEW SENSOR, Disp: 2 each, Rfl: 5   Continuous Glucose Sensor (FREESTYLE LIBRE 14 DAY SENSOR) MISC, APPLY SENSOR EVERY 14 DAYS TO MONITOR BLOOD GLUCOSE LEVELS. REMOVE OLD SENSOR BEFORE APPLYING NEW SENSOR, Disp: 2 each, Rfl: 5   dapagliflozin propanediol (FARXIGA) 10 MG TABS tablet, Take 1 tablet (10 mg total) by mouth daily., Disp: 90 tablet, Rfl: 3   diphenoxylate-atropine (LOMOTIL) 2.5-0.025 MG tablet, Take by mouth., Disp: , Rfl:    donepezil (ARICEPT) 10 MG tablet, Take 10 mg by mouth at bedtime., Disp: , Rfl:    fenofibrate (TRICOR) 145 MG tablet, TAKE  1 TABLET BY MOUTH DAILY, Disp: 90 tablet, Rfl: 1   gabapentin (NEURONTIN) 600 MG tablet, TAKE TWO TABLETS BY MOUTH THREE TIMES A DAY, Disp: 180 tablet, Rfl: 1   lidocaine (LIDODERM) 5 %, Place 1 patch onto the skin every 12 (twelve) hours. Remove & Discard patch within 12 hours or as directed by MD, Disp: 30 patch, Rfl: 2   linagliptin (TRADJENTA) 5 MG TABS tablet, Take 1 tablet (5 mg total) by mouth daily., Disp: 30 tablet, Rfl: 5   lisinopril-hydrochlorothiazide (ZESTORETIC) 20-25 MG tablet, TAKE 1 TABLET BY MOUTH TWICE A DAY, Disp: 180 tablet, Rfl: 1   memantine (NAMENDA) 10 MG tablet, Take 1 tablet by mouth daily., Disp: , Rfl:    metFORMIN (GLUCOPHAGE-XR) 500 MG 24 hr tablet, TAKE 1 TABLET BY MOUTH TWICE A DAY, Disp: 180 tablet, Rfl: 1   metoprolol succinate (TOPROL-XL) 25 MG 24 hr tablet, Take 25 mg by mouth daily., Disp: , Rfl:    Multiple Vitamins-Minerals (CENTRUM SILVER PO), Take 1 tablet by mouth daily., Disp: , Rfl:    omega-3 acid ethyl esters (LOVAZA) 1 g capsule, TAKE 2 CAPSULES BY MOUTH TWICE A DAY, Disp: 360 capsule, Rfl: 3   omeprazole (PRILOSEC) 20 MG capsule, Take 20 mg by mouth daily., Disp: , Rfl:    oxyCODONE-acetaminophen (PERCOCET) 10-325 MG tablet, Take 1 tablet by mouth every 6 (six) hours as needed for pain. Must last 30 days., Disp: 120 tablet, Rfl: 0   [START ON 02/26/2023] oxyCODONE-acetaminophen (PERCOCET) 10-325 MG tablet, Take 1 tablet by mouth every 6 (six) hours as needed for pain. Must last 30 days., Disp: 120 tablet, Rfl: 0   pantoprazole (PROTONIX) 40 MG tablet, Take 40 mg by mouth daily., Disp: , Rfl:    pramipexole (MIRAPEX) 0.125 MG tablet, TAKE 1 TABLET BY MOUTH EVERY NIGHT 2-3 HOURS BEFORE BEDTIME, Disp: 90 tablet, Rfl: 1   primidone (MYSOLINE) 50 MG tablet, Take 150-250 mg by mouth 2 (two) times daily. Take 250 mg by mouth in the morning & take 150 mg at night., Disp: , Rfl:    QUEtiapine (SEROQUEL) 25 MG tablet, Take 75 mg by mouth at bedtime., Disp: ,  Rfl:    rosuvastatin (CRESTOR) 40 MG tablet, TAKE ONE TABLET BY MOUTH DAILY, Disp: 90 tablet, Rfl: 3   tamsulosin (FLOMAX) 0.4 MG CAPS capsule, TAKE 2 CAPSULES BY MOUTH DAILY  AFTER SUPPER, Disp: 180 capsule, Rfl: 0   traZODone (DESYREL) 100 MG tablet, Take 2 tablets (200 mg total) by mouth at bedtime as needed for sleep., Disp: 180 tablet, Rfl: 0   vitamin C (ASCORBIC ACID) 500 MG tablet, Take 1,000 mg by mouth daily., Disp: , Rfl:    cholestyramine (QUESTRAN) 4 g packet, Take 1 packet by mouth 2 (two) times daily. (Patient not taking: Reported on 12/31/2022), Disp: , Rfl:    ciclopirox (PENLAC) 8 % solution, Apply topically at bedtime. Apply over nail and surrounding skin. Apply daily over previous coat. After seven (7) days, may remove with alcohol and continue cycle. (Patient not taking: Reported on 12/31/2022), Disp: 6.6 mL, Rfl: 0   divalproex (DEPAKOTE ER) 500 MG 24 hr tablet, Take 1 tablet by mouth 2 (two) times daily., Disp: , Rfl:    DULoxetine (CYMBALTA) 30 MG capsule, Take 2 capsules (60 mg total) by mouth daily. (Patient not taking: Reported on 01/18/2023), Disp: 60 capsule, Rfl: 3   empagliflozin (JARDIANCE) 10 MG TABS tablet, Take 1 tablet by mouth daily., Disp: , Rfl:    [START ON 01/27/2023] oxyCODONE-acetaminophen (PERCOCET) 10-325 MG tablet, Take 1 tablet by mouth every 6 (six) hours as needed for pain. Must last 30 days., Disp: 120 tablet, Rfl: 0       No Known Allergies Allergies were reviewed by me prior to the procedure  Objective   There is no height or weight on file to calculate BMI. Vitals:   01/18/23 0825 01/18/23 0913 01/18/23 0923  BP:  (!) 90/49 (!) 103/56  Pulse: 83 78   Resp: 20 19   Temp: (!) 96.2 F (35.7 C) 97.6 F (36.4 C)   TempSrc: Temporal Temporal   SpO2: 94% 96%      Physical Exam Vitals and nursing note reviewed.  Constitutional:      General: He is not in acute distress.    Appearance: Normal appearance. He is not ill-appearing,  toxic-appearing or diaphoretic.  HENT:     Head: Normocephalic and atraumatic.     Nose: Nose normal.     Mouth/Throat:     Mouth: Mucous membranes are moist.     Pharynx: Oropharynx is clear.  Eyes:     General: No scleral icterus.    Extraocular Movements: Extraocular movements intact.  Cardiovascular:     Rate and Rhythm: Normal rate and regular rhythm.     Heart sounds: Normal heart sounds. No murmur heard.    No friction rub. No gallop.  Pulmonary:     Effort: Pulmonary effort is normal. No respiratory distress.     Breath sounds: Normal breath sounds. No wheezing, rhonchi or rales.  Abdominal:     General: Bowel sounds are normal. There is no distension.     Palpations: Abdomen is soft.     Tenderness: There is no abdominal tenderness. There is no guarding or rebound.  Musculoskeletal:     Cervical back: Neck supple.     Right lower leg: No edema.     Left lower leg: No edema.  Skin:    General: Skin is warm and dry.     Coloration: Skin is not jaundiced or pale.  Neurological:     General: No focal deficit present.     Mental Status: He is alert and oriented to person, place, and time. Mental status is at baseline.  Psychiatric:        Mood and Affect: Mood normal.  Behavior: Behavior normal.        Thought Content: Thought content normal.        Judgment: Judgment normal.      Assessment:  James Hood is a 82 y.o. male  who presents today for Esophagogastroduodenoscopy for epigastric pain, dysphagia, Barretts - h/o high grade dysplasia .  Plan:  Esophagogastroduodenoscopy with possible intervention today  Esophagogastroduodenoscopy with possible biopsy, control of bleeding, polypectomy, and interventions as necessary has been discussed with the patient/patient representative. Informed consent was obtained from the patient/patient representative after explaining the indication, nature, and risks of the procedure including but not limited to death,  bleeding, perforation, missed neoplasm/lesions, cardiorespiratory compromise, and reaction to medications. Opportunity for questions was given and appropriate answers were provided. Patient/patient representative has verbalized understanding is amenable to undergoing the procedure.   [name], DO  [facility]  Portions of the record may have been created with voice recognition software. Occasional wrong-word or 'sound-a-like' substitutions may have occurred due to the inherent limitations of voice recognition software.  Read the chart carefully and recognize, using context, where substitutions may have occurred.

## 2023-01-22 ENCOUNTER — Encounter: Payer: Self-pay | Admitting: Gastroenterology

## 2023-01-23 ENCOUNTER — Telehealth: Payer: Self-pay

## 2023-01-23 DIAGNOSIS — M5416 Radiculopathy, lumbar region: Secondary | ICD-10-CM | POA: Diagnosis not present

## 2023-01-23 NOTE — Telephone Encounter (Signed)
Left message to call the office back regarding Dr. Purvis Sheffield my chart message.

## 2023-01-23 NOTE — Telephone Encounter (Signed)
-----   Message from [name] sent at 01/23/2023 10:48 AM EDT ----- Please relay the MyChart result message to the patient.  Thanks.

## 2023-01-24 NOTE — Anesthesia Postprocedure Evaluation (Signed)
Anesthesia Post Note  Patient: James Hood  Procedure(s) Performed: ESOPHAGOGASTRODUODENOSCOPY (EGD) WITH PROPOFOL BIOPSY MALONEY DILATION  Patient location during evaluation: Endoscopy Anesthesia Type: General Level of consciousness: awake and alert Pain management: pain level controlled Vital Signs Assessment: post-procedure vital signs reviewed and stable Respiratory status: spontaneous breathing, nonlabored ventilation, respiratory function stable and patient connected to nasal cannula oxygen Cardiovascular status: blood pressure returned to baseline and stable Postop Assessment: no apparent nausea or vomiting Anesthetic complications: no   No notable events documented.   Last Vitals:  Vitals:   01/18/23 0913 01/18/23 0923  BP: (!) 90/49 (!) 103/56  Pulse: 78   Resp: 19   Temp: 36.4 C   SpO2: 96%     Last Pain:  Vitals:   01/19/23 1019  TempSrc:   PainSc: 0-No pain                 Lenard Simmer

## 2023-01-31 ENCOUNTER — Ambulatory Visit
Payer: PPO | Attending: Student in an Organized Health Care Education/Training Program | Admitting: Student in an Organized Health Care Education/Training Program

## 2023-01-31 ENCOUNTER — Encounter: Payer: Self-pay | Admitting: Student in an Organized Health Care Education/Training Program

## 2023-01-31 VITALS — BP 121/71 | HR 78 | Temp 98.2°F | Resp 16 | Ht 70.0 in | Wt 165.0 lb

## 2023-01-31 DIAGNOSIS — M5386 Other specified dorsopathies, lumbar region: Secondary | ICD-10-CM | POA: Insufficient documentation

## 2023-01-31 DIAGNOSIS — M5417 Radiculopathy, lumbosacral region: Secondary | ICD-10-CM | POA: Insufficient documentation

## 2023-01-31 DIAGNOSIS — G894 Chronic pain syndrome: Secondary | ICD-10-CM | POA: Insufficient documentation

## 2023-01-31 DIAGNOSIS — M48061 Spinal stenosis, lumbar region without neurogenic claudication: Secondary | ICD-10-CM | POA: Insufficient documentation

## 2023-01-31 NOTE — Patient Instructions (Signed)
______________________________________________________________________    General Risks and Possible Complications  Patient Responsibilities: It is important that you read this as it is part of your informed consent. It is our duty to inform you of the risks and possible complications associated with treatments offered to you. It is your responsibility as a patient to read this and to ask questions about anything that is not clear or that you believe was not covered in this document.  Patient's Rights: You have the right to refuse treatment. You also have the right to change your mind, even after initially having agreed to have the treatment done. However, under this last option, if you wait until the last second to change your mind, you may be charged for the materials used up to that point.  Introduction: Medicine is not an Visual merchandiser. Everything in Medicine, including the lack of treatment(s), carries the potential for danger, harm, or loss (which is by definition: Risk). In Medicine, a complication is a secondary problem, condition, or disease that can aggravate an already existing one. All treatments carry the risk of possible complications. The fact that a side effects or complications occurs, does not imply that the treatment was conducted incorrectly. It must be clearly understood that these can happen even when everything is done following the highest safety standards.  No treatment: You can choose not to proceed with the proposed treatment alternative. The "PRO(s)" would include: avoiding the risk of complications associated with the therapy. The "CON(s)" would include: not getting any of the treatment benefits. These benefits fall under one of three categories: diagnostic; therapeutic; and/or palliative. Diagnostic benefits include: getting information which can ultimately lead to improvement of the disease or symptom(s). Therapeutic benefits are those associated with the successful  treatment of the disease. Finally, palliative benefits are those related to the decrease of the primary symptoms, without necessarily curing the condition (example: decreasing the pain from a flare-up of a chronic condition, such as incurable terminal cancer).  General Risks and Complications: These are associated to most interventional treatments. They can occur alone, or in combination. They fall under one of the following six (6) categories: no benefit or worsening of symptoms; bleeding; infection; nerve damage; allergic reactions; and/or death. No benefits or worsening of symptoms: In Medicine there are no guarantees, only probabilities. No healthcare provider can ever guarantee that a medical treatment will work, they can only state the probability that it may. Furthermore, there is always the possibility that the condition may worsen, either directly, or indirectly, as a consequence of the treatment. Bleeding: This is more common if the patient is taking a blood thinner, either prescription or over the counter (example: Goody Powders, Fish oil, Aspirin, Garlic, etc.), or if suffering a condition associated with impaired coagulation (example: Hemophilia, cirrhosis of the liver, low platelet counts, etc.). However, even if you do not have one on these, it can still happen. If you have any of these conditions, or take one of these drugs, make sure to notify your treating physician. Infection: This is more common in patients with a compromised immune system, either due to disease (example: diabetes, cancer, human immunodeficiency virus [HIV], etc.), or due to medications or treatments (example: therapies used to treat cancer and rheumatological diseases). However, even if you do not have one on these, it can still happen. If you have any of these conditions, or take one of these drugs, make sure to notify your treating physician. Nerve Damage: This is more common when the treatment is  an invasive one, but it  can also happen with the use of medications, such as those used in the treatment of cancer. The damage can occur to small secondary nerves, or to large primary ones, such as those in the spinal cord and brain. This damage may be temporary or permanent and it may lead to impairments that can range from temporary numbness to permanent paralysis and/or brain death. Allergic Reactions: Any time a substance or material comes in contact with our body, there is the possibility of an allergic reaction. These can range from a mild skin rash (contact dermatitis) to a severe systemic reaction (anaphylactic reaction), which can result in death. Death: In general, any medical intervention can result in death, most of the time due to an unforeseen complication. ______________________________________________________________________      ______________________________________________________________________    Preparing for your procedure  Appointments: If you think you may not be able to keep your appointment, call 24-48 hours in advance to cancel. We need time to make it available to others.  During your procedure appointment there will be: No Prescription Refills. No disability issues to discussed. No medication changes or discussions.  Instructions: Food intake: Avoid eating anything solid for at least 8 hours prior to your procedure. Clear liquid intake: You may take clear liquids such as water up to 2 hours prior to your procedure. (No carbonated drinks. No soda.) Transportation: Unless otherwise stated by your physician, bring a driver. (Driver cannot be a Market researcher, Pharmacist, community, or any other form of public transportation.) Morning Medicines: Except for blood thinners, take all of your other morning medications with a sip of water. Make sure to take your heart and blood pressure medicines. If your blood pressure's lower number is above 100, the case will be rescheduled. Blood thinners: Make sure to stop your blood  thinners as instructed.  If you take a blood thinner, but were not instructed to stop it, call our office [phone] and ask to talk to a nurse. Not stopping a blood thinner prior to certain procedures could lead to serious complications. Diabetics on insulin: Notify the staff so that you can be scheduled 1st case in the morning. If your diabetes requires high dose insulin, take only  of your normal insulin dose the morning of the procedure and notify the staff that you have done so. Preventing infections: Shower with an antibacterial soap the morning of your procedure.  Build-up your immune system: Take 1000 mg of Vitamin C with every meal (3 times a day) the day prior to your procedure. Antibiotics: Inform the nursing staff if you are taking any antibiotics or if you have any conditions that may require antibiotics prior to procedures. (Example: recent joint implants)   Pregnancy: If you are pregnant make sure to notify the nursing staff. Not doing so may result in injury to the fetus, including death.  Sickness: If you have a cold, fever, or any active infections, call and cancel or reschedule your procedure. Receiving steroids while having an infection may result in complications. Arrival: You must be in the facility at least 30 minutes prior to your scheduled procedure. Tardiness: Your scheduled time is also the cutoff time. If you do not arrive at least 15 minutes prior to your procedure, you will be rescheduled.  Children: Do not bring any children with you. Make arrangements to keep them home. Dress appropriately: There is always a possibility that your clothing may get soiled. Avoid long dresses. Valuables: Do not bring any jewelry  or valuables.  Reasons to call and reschedule or cancel your procedure: (Following these recommendations will minimize the risk of a serious complication.) Surgeries: Avoid having procedures within 2 weeks of any surgery. (Avoid for 2 weeks before or after any  surgery). Flu Shots: Avoid having procedures within 2 weeks of a flu shots or . (Avoid for 2 weeks before or after immunizations). Barium: Avoid having a procedure within 7-10 days after having had a radiological study involving the use of radiological contrast. (Myelograms, Barium swallow or enema study). Heart attacks: Avoid any elective procedures or surgeries for the initial 6 months after a "Myocardial Infarction" (Heart Attack). Blood thinners: It is imperative that you stop these medications before procedures. Let us know if you if you take any blood thinner.  Infection: Avoid procedures during or within two weeks of an infection (including chest colds or gastrointestinal problems). Symptoms associated with infections include: Localized redness, fever, chills, night sweats or profuse sweating, burning sensation when voiding, cough, congestion, stuffiness, runny nose, sore throat, diarrhea, nausea, vomiting, cold or Flu symptoms, recent or current infections. It is specially important if the infection is over the area that we intend to treat. Heart and lung problems: Symptoms that may suggest an active cardiopulmonary problem include: cough, chest pain, breathing difficulties or shortness of breath, dizziness, ankle swelling, uncontrolled high or unusually low blood pressure, and/or palpitations. If you are experiencing any of these symptoms, cancel your procedure and contact your primary care physician for an evaluation.  Remember:  Regular Business hours are:  Monday to Thursday 8:00 AM to 4:00 PM  Provider's Schedule: Delano Metz, MD:  Procedure days: Tuesday and Thursday 7:30 AM to 4:00 PM  Edward Jolly, MD:  Procedure days: Monday and Wednesday 7:30 AM to 4:00 PM Last  Updated: 01/08/2023 ______________________________________________________________________

## 2023-01-31 NOTE — Progress Notes (Signed)
Safety precautions to be maintained throughout the outpatient stay will include: orient to surroundings, keep bed in low position, maintain call bell within reach at all times, provide assistance with transfer out of bed and ambulation.  

## 2023-01-31 NOTE — Progress Notes (Signed)
PROVIDER NOTE: Information contained herein reflects review and annotations entered in association with encounter. Interpretation of such information and data should be left to medically-trained personnel. Information provided to patient can be located elsewhere in the medical record under "Patient Instructions". Document created using STT-dictation technology, any transcriptional errors that may result from process are unintentional.    Patient: James Hood  Service Category: E/M  Provider: Edward Jolly, MD  DOB: [birth year]  DOS: 01/31/2023  Referring Provider: Glori Luis, MD  MRN: [id number]  Specialty: Interventional Pain Management  PCP: Glori Luis, MD  Type: Established Patient  Setting: Ambulatory outpatient    Location: Office  Delivery: Face-to-face     HPI  [name], a 82 y.o. 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 Mr. Schulte's primary complain today is Back Pain (Lumbar left ) and Ankle Pain (Left )  Pertinent problems: [name] has Primary localized osteoarthritis of right knee; DJD (degenerative joint disease) of knee; Lumbar herniated disc; Chronic pain syndrome; Failed back surgical syndrome; Lumbosacral radiculopathy; Primary osteoarthritis of left ankle; Lumbar facet joint syndrome; Neuropathy; Chronic pain of left ankle; and Osteochondral defect of ankle on their pertinent problem list. Pain Assessment: Severity of Chronic pain is reported as a 5 /10. Location: Back Lower, Left/? to left ankle.  when ankle hurts severly it radiates up to knee and sometimes to the hip. Onset: More than a month ago. Quality: Aching, Discomfort, Constant. Timing: Constant. Modifying factor(s): stopped ASA and ttylenol.  oxycodone - apap helps some but not a lot.. Vitals:  height is 5\' 10"  (1.778 m) and weight is 165 lb (74.8 kg). His temporal temperature is 98.2 F (36.8 C). His blood pressure is 121/71 and his  pulse is 78. His respiration is 16 and oxygen saturation is 97%.  BMI: Estimated body mass index is 23.68 kg/m as calculated from the following:   Height as of this encounter: 5\' 10"  (1.778 m).   Weight as of this encounter: 165 lb (74.8 kg). Last encounter: 12/20/2022. Last procedure: Visit date not found.  Reason for encounter: patient-requested evaluation.   Patient is experiencing increased low back and radiating left leg pain.  He had a recent lumbar MRI done which shows severe left L4-L5 neuroforaminal narrowing and left L5-S1 neuroforaminal narrowing.  We discussed a left transforaminal epidural steroid injection for his radicular pain.  He has done physical therapy in the past with limited response.  He is on multimodal analgesics.  No further dose escalation of opioid analgesics.  ROS  Constitutional: Denies any fever or chills Gastrointestinal: No reported hemesis, hematochezia, vomiting, or acute GI distress Musculoskeletal:  Low back pain which radiates into left leg, left ankle pain Neurological: No reported episodes of acute onset apraxia, aphasia, dysarthria, agnosia, amnesia, paralysis, loss of coordination, or loss of consciousness  Medication Review  DULoxetine, FreeStyle Libre 14 Day Sensor, Multiple Vitamins-Minerals, QUEtiapine, ascorbic acid, aspirin EC, buPROPion, cholestyramine, ciclopirox, dapagliflozin propanediol, diphenoxylate-atropine, divalproex, donepezil, empagliflozin, fenofibrate, gabapentin, lidocaine, linagliptin, lisinopril-hydrochlorothiazide, memantine, metFORMIN, metoprolol succinate, omega-3 acid ethyl esters, omeprazole, oxyCODONE-acetaminophen, pantoprazole, pramipexole, primidone, rosuvastatin, tamsulosin, and traZODone  History Review  Allergy: [name] has No Known Allergies. Drug: [name]  reports no history of drug use. Alcohol:  reports that he does not currently use alcohol. Tobacco:  reports that he has been smoking cigarettes. He has  a 30 pack-year smoking history. He has never used smokeless tobacco. Social: [name]  reports that he has been smoking cigarettes. He has a 30 pack-year smoking history. He has never used smokeless tobacco. He reports that he does not currently use alcohol. He reports that he does not use drugs. 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 Surgical: 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 Family: family history includes Depression in his father; Heart attack in his mother; Heart disease in his mother; Hypertension in his father.  Laboratory Chemistry Profile   Renal Lab Results  Component Value Date   BUN 43 (H) 01/16/2023   CREATININE 1.37 01/16/2023   BCR 23 10/08/2022   GFR 47.89 (L) 01/16/2023   GFRAA >60 09/25/2019   GFRNONAA 55 (L) 06/08/2020     Hepatic Lab Results  Component Value Date   AST 32 12/04/2022   ALT 23 12/04/2022   ALBUMIN 4.0 12/04/2022   ALKPHOS 55 12/04/2022   AMMONIA 14 08/09/2016    Electrolytes Lab Results  Component Value Date   NA 134 (L) 01/16/2023   K 4.3 01/16/2023   CL 98 01/16/2023   CALCIUM 9.6 01/16/2023   MG 1.4 (L) 09/08/2020    Bone Lab Results  Component Value Date   VD25OH 37.09 11/10/2021    Inflammation (CRP: Acute Phase) (ESR: Chronic Phase) Lab Results  Component Value Date   ESRSEDRATE 15 07/20/2019   LATICACIDVEN 1.9 06/08/2020         Note: Above Lab results reviewed.  Recent Imaging Review  DG Chest 2 View CLINICAL DATA:  82 year old male with history of weight loss. Progressively worsening breast sounds.  EXAM: CHEST - 2 VIEW  COMPARISON:  Chest x-ray 11/10/2021.  FINDINGS: Lung volumes are normal. No consolidative airspace disease. No pleural effusions. No pneumothorax. No pulmonary nodule or mass noted. Pulmonary vasculature and the cardiomediastinal silhouette are within normal limits. Atherosclerosis in the thoracic aorta.  IMPRESSION: 1.  No radiographic evidence of acute cardiopulmonary disease. 2. Aortic atherosclerosis.  Electronically Signed   By: Trudie Reed M.D.   On: 12/11/2022 13:57  EXAM: Magnetic resonance imaging, spinal canal and contents, lumbar, without contrast material.  DATE: 12/26/2022 1:17 PM  ACCESSION: [id number]  DICTATED: 12/26/2022 1:34 PM  INTERPRETATION LOCATION: [facility]   CLINICAL INDICATION: 82 years old Male with low back pain, radiating leg pain  - M54.16 - Left lumbar radiculopathy    COMPARISON: None   TECHNIQUE: Multiplanar MRI was performed through the lumbar spine without intravenous contrast.   FINDINGS:   Multilevel degenerative endplate changes, most pronounced at L5-S1.  Schmorl nodes at inferior endplate of L1, L2-L3, and superior endplate of L4.  Heterogenous bone marrow signal  intensity, likely degenerative.   The visualized cord is unremarkable.  The conus medullaris ends  at a normal level.   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  Straightening of normal lumbar lordosis.  Trace anterolisthesis at L4-L5.  Trace left lateral listhesis at L4-L5.  Vertebral alignment is otherwise unremarkable.   Multilevel disc degeneration and height loss.   T12-L1: Mild diffuse disc bulge and mild ligamentum flavum thickening. No significant spinal canal or neural foraminal narrowing.   L1-L2: Diffuse disc bulge with ligamentum flavum thickening and facet hypertrophy. No significant central spinal canal stenosis. Mild right neural foraminal narrowing.   L2-L3: Diffuse disc bulge with ligamentum flavum thickening and right predominant facet hypertrophy. No significant central spinal canal stenosis. Mild right and minimal left neural foraminal stenosis.   L3-L4: Diffuse disc bulge with central annular fissuring and right foraminal protrusion, ligamentum flavum thickening and right facet arthrosis contributes to mild spinal canal stenosis, partial effacement of the right lateral recess, mild left and severe right neural foraminal narrowing. The disc contacts the bilateral traversing nerve roots [4:131].   L4-L5: Asymmetric disc bulge with ligamentum flavum thickening and facet arthropathy, contributes to mild spinal canal stenosis, moderate right and moderate to severe left neural foraminal narrowing.   L5-S1: Asymmetric disc bulge with ligamentum flavum thickening and facet arthropathy contributes to mild spinal canal stenosis, severe left and mild-to-moderate right neural foraminal narrowing.   Mild fatty atrophy of posterior paraspinal muscles.  Nonspecific perinephric fat stranding bilaterally.  Paravertebral soft tissues are otherwise unremarkable.   For the purposes of this dictation, the lowest well formed intervertebral disc space is assumed to be the L5-S1  level, and there are presumed to be five lumbar-type vertebral bodies.   Note: Reviewed        Physical Exam  General appearance: Well nourished, well developed, and well hydrated. In no apparent acute distress Mental status: Alert, oriented x 3 (person, place, & time)       Respiratory: No evidence of acute respiratory distress Eyes: PERLA Vitals: BP 121/71 (BP Location: Right Arm, Patient Position: Sitting, Cuff Size: Normal)   Pulse 78   Temp 98.2 F (36.8 C) (Temporal)   Resp 16   Ht 5\' 10"  (1.778 m)   Wt 165 lb (74.8 kg)   SpO2 97%   BMI 23.68 kg/m  BMI: Estimated body mass index is 23.68 kg/m as calculated from the following:   Height as of this encounter: 5\' 10"  (1.778 m).   Weight as of this encounter: 165 lb (74.8 kg). Ideal: Ideal body weight: 73 kg (160 lb 15 oz) Adjusted ideal body weight: 73.7 kg (162 lb 9 oz)  Lumbar Spine Area Exam  Skin & Axial Inspection: No masses, redness, or swelling Alignment: Symmetrical Functional ROM: Pain restricted ROM affecting primarily the left Stability: No instability detected Muscle Tone/Strength: Functionally intact. No obvious neuro-muscular anomalies detected. Sensory (Neurological): Dermatomal pain pattern  Palpation: No palpable anomalies       Provocative Tests:  Lumbar quadrant test (Kemp's test): (+) on the left for foraminal stenosis Lateral bending test: (+) ipsilateral radicular pain, on the left. Positive for left-sided foraminal stenosis.  Gait & Posture Assessment  Ambulation: Limited Gait: Relatively normal for age and body habitus Posture: WNL  Lower Extremity Exam    Side: Right lower extremity  Side: Left lower extremity  Stability: No instability observed          Stability: No instability observed          Skin & Extremity Inspection: Skin color, temperature, and hair growth are WNL. No  peripheral edema or cyanosis. No masses, redness, swelling, asymmetry, or associated skin lesions. No contractures.   Skin & Extremity Inspection: Skin color, temperature, and hair growth are WNL. No peripheral edema or cyanosis. No masses, redness, swelling, asymmetry, or associated skin lesions. No contractures.  Functional ROM: Unrestricted ROM                  Functional ROM: Pain restricted ROM for hip and knee joints          Muscle Tone/Strength: Functionally intact. No obvious neuro-muscular anomalies detected.  Muscle Tone/Strength: Functionally intact. No obvious neuro-muscular anomalies detected.  Sensory (Neurological): Unimpaired        Sensory (Neurological): Unimpaired        DTR: Patellar: deferred today Achilles: deferred today Plantar: deferred today  DTR: Patellar: deferred today Achilles: deferred today Plantar: deferred today  Palpation: No palpable anomalies  Palpation: No palpable anomalies    Assessment   Diagnosis Status  1. 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 Lumbosacral radiculopathy   3. Sciatica of left side associated with disorder of lumbar spine   4. Chronic pain syndrome    Having a Flare-up Having a Flare-up Having a Flare-up   Updated Problems: Problem  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)  Sciatica of Left Side Associated With Disorder of Lumbar Spine    Plan of Care  Reviewed MRI with patient in detail.  He has severe foraminal stenosis at left L4-L5 and left L5-S1.  We discussed a left transforaminal epidural steroid injection.  He tries to do home stretching exercises but states that they are not helpful.  He is done formal physical therapy in the past.  He continues his multimodal pain regimen.  Follow-up in 2 weeks for left L4 and L5 transforaminal ESI. Orders:  Orders Placed This Encounter  Procedures   Lumbar Transforaminal Epidural    Standing Status:   Future    Standing Expiration Date:   05/02/2023    Scheduling Instructions:     Laterality: LEFT     Level(s): L4 and L5            Sedation: PO Valium     Timeframe: ASAP    Order Specific Question:   Where will this procedure be performed?    Answer:   [facility]   Follow-up plan:   Return in about 18 days (around 02/18/2023) for Left L4 and L5 TF ESI, in clinic (PO Valium).     Recent Visits Date Type Provider Dept  12/20/22 Office Visit Edward Jolly, MD [facility]  11/28/22 Office Visit Edward Jolly, MD [facility]  Showing recent visits within past 90 days and meeting all other requirements Today's Visits Date Type Provider Dept  01/31/23 Office Visit Edward Jolly, MD [facility]  Showing today's visits and meeting all other requirements Future Appointments Date Type Provider Dept  02/20/23 Appointment Edward Jolly, MD [facility]  03/21/23 Appointment Edward Jolly, MD [facility]  Showing future appointments within next 90 days and meeting all other requirements  I discussed the assessment and treatment plan with the patient. The patient was provided an opportunity to ask questions and all were answered. The patient agreed with the plan and demonstrated an understanding of the instructions.  Patient advised to call back or seek an in-person evaluation if the symptoms or condition worsens.  Duration of  encounter: .  Total time on encounter, as per AMA guidelines included both the face-to-face and non-face-to-face time personally spent by the physician and/or other qualified health care professional(s) on the day of the encounter (includes time in activities that require the physician or other qualified health care professional and does not include time in activities normally performed by clinical staff). Physician's time may include the following activities when performed: Preparing to see the patient (e.g., pre-charting review of records, searching for previously ordered imaging, lab work, and nerve conduction tests) Review of  prior analgesic pharmacotherapies. Reviewing PMP Interpreting ordered tests (e.g., lab work, imaging, nerve conduction tests) Performing post-procedure evaluations, including interpretation of diagnostic procedures Obtaining and/or reviewing separately obtained history Performing a medically appropriate examination and/or evaluation Counseling and educating the patient/family/caregiver Ordering medications, tests, or procedures Referring and communicating with other health care professionals (when not separately reported) Documenting clinical information in the electronic or other health record Independently interpreting results (not separately reported) and communicating results to the patient/ family/caregiver Care coordination (not separately reported)  Note by: Edward Jolly, MD Date: 01/31/2023; Time: 3:26 PM

## 2023-02-01 NOTE — Progress Notes (Unsigned)
Virtual Visit via Video Note  I connected with James Hood on 02/06/23 at  9:30 AM EDT by a video enabled telemedicine application and verified that I am speaking with the correct person using two identifiers.  Location: Patient: car Provider: office Persons participated in the visit- patient, provider    I discussed the limitations of evaluation and management by telemedicine and the availability of in person appointments. The patient expressed understanding and agreed to proceed.   I discussed the assessment and treatment plan with the patient. The patient was provided an opportunity to ask questions and all were answered. The patient agreed with the plan and demonstrated an understanding of the instructions.   The patient was advised to call back or seek an in-person evaluation if the symptoms worsen or if the condition fails to improve as anticipated.  I provided 30 minutes of non-face-to-face time during this encounter. More than half of the sessions were conducted through video visits, with occasional use of telephone due to technical difficulties.  James Hotter, MD    [facility] MD/PA/NP OP Progress Note  02/06/2023 9:53 AM [name]  MRN:  [id number]  Chief Complaint:  Chief Complaint  Patient presents with   Follow-up   HPI:  This is a follow-up appointment for depression, insomnia.  He states that he is in [facility], and it has been beautiful.  He has a nightstick being built as well as the fence.  Although he agrees that it was stressful when they first moved in, it has been smoothed out.  His wife had iron infusion, and was asleep through the day.  They bought air cleaner for his wife, who has some allergy reaction.  He is currently in a car in the parking lot to get a breakfast.  He comes there by himself.  When he is asked about his mood, he states that he has been feeling good, and denies feeling depressed.  He denies anxiety.  He sleeps well.  He believes he has  been taking duloxetine every day along with other medication.  He sleeps well, taking trazodone.  He denies drowsiness or fall.  He denies SI.   The phone connection was frequently interrupted, likely due to limited reception. This writer left a voice message to contact the office to make a follow up appointment, and if there are any additional questions to be addressed.   Visit Diagnosis:    ICD-10-CM   1. MDD (major depressive disorder), recurrent, in partial remission (HCC)  F33.41     2. Insomnia, unspecified type  G47.00       Past Psychiatric History: Please see initial evaluation for full details. I have reviewed the history. No updates at this time.     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/2017   Stomach ulcer    Tremor, essential    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/02/2018    Past Surgical History:  Procedure Laterality Date   ANKLE ARTHROSCOPY Left 09/29/2019   Procedure: LEFT ANKLE ARTHROSCOPY, DEBRIDEMENT;  Surgeon: Nadara Mustard, MD;  Location:  SURGERY CENTER;  Service: Orthopedics;  Laterality: Left;   BACK SURGERY     BIOPSY  01/18/2023   Procedure: BIOPSY;  Surgeon: [name], DO;  Location: [facility] ENDOSCOPY;  Service: Gastroenterology;;   COLONOSCOPY WITH PROPOFOL N/A 09/14/2016   Procedure: COLONOSCOPY WITH PROPOFOL;  Surgeon:  Scot Jun, MD;  Location: [facility] ENDOSCOPY;  Service: Endoscopy;  Laterality: N/A;   esophageal stretch     ESOPHAGOGASTRODUODENOSCOPY (EGD) WITH PROPOFOL N/A 12/13/2020   Procedure: ESOPHAGOGASTRODUODENOSCOPY (EGD) WITH PROPOFOL;  Surgeon: Regis Bill, MD;  Location: [facility] ENDOSCOPY;  Service: Endoscopy;  Laterality: N/A;  IDDM   ESOPHAGOGASTRODUODENOSCOPY (EGD) WITH PROPOFOL N/A 01/18/2023   Procedure: ESOPHAGOGASTRODUODENOSCOPY (EGD) WITH PROPOFOL;  Surgeon: [name], DO;  Location: [facility] ENDOSCOPY;  Service: Gastroenterology;  Laterality: N/A;   JOINT REPLACEMENT Right 2016   knee   LUMBAR LAMINECTOMY/DECOMPRESSION MICRODISCECTOMY Left 02/03/2016   Procedure: LEFT L5-S1 DISKECTOMY;  Surgeon: Hilda Lias, MD;  Location: [facility] NEURO ORS;  Service: Neurosurgery;  Laterality: Left;  LEFT L5-S1 DISKECTOMY   MALONEY DILATION  01/18/2023   Procedure: MALONEY DILATION;  Surgeon: [name], DO;  Location: [facility] ENDOSCOPY;  Service: Gastroenterology;;   PULSE GENERATOR IMPLANT N/A 08/21/2017   Procedure: UNILATERAL PULSE GENERATOR IMPLANT;  Surgeon: Venetia Night, MD;  Location: [facility] ORS;  Service: Neurosurgery;  Laterality: N/A;   PULSE GENERATOR IMPLANT Right 04/02/2018   Procedure: REMOVAL OF PULSE GENERATOR IMPLANT AND LEADS;  Surgeon: Venetia Night, MD;  Location: [facility] ORS;  Service: Neurosurgery;  Laterality: Right;   TONSILLECTOMY     TOTAL KNEE ARTHROPLASTY Right 11/15/2014   Procedure: TOTAL KNEE ARTHROPLASTY;  Surgeon: Salvatore Marvel, MD;  Location: [facility] OR;  Service: Orthopedics;  Laterality: Right;    Family Psychiatric History: Please see initial evaluation for full details. I have reviewed the history. No updates at this time.     Family History:  Family History  Problem Relation Age of Onset   Heart attack Mother    Heart disease Mother    Hypertension Father    Depression Father    Kidney cancer Neg Hx    Prostate cancer Neg Hx      Social History:  Social History   Socioeconomic History   Marital status: Married    Spouse name: Not on file   Number of children: Not on file   Years of education: Not on file   Highest education level: Master's degree (e.g., MA, MS, MEng, MEd, MSW, MBA)  Occupational History   Not on file  Tobacco Use   Smoking status: Every Day    Current packs/day: 0.50    Average packs/day: 0.5 packs/day for 60.0 years (30.0 ttl pk-yrs)    Types: Cigarettes   Smokeless tobacco: Never  Vaping Use   Vaping status: Former  Substance and Sexual Activity   Alcohol use: Not Currently    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   Drug use: Never   Sexual activity: Not Currently  Other Topics Concern   Not on file  Social History Narrative   Not on file   Social Determinants of Health   Financial Resource Strain: Low Risk  (  12/31/2022)   Overall Financial Resource Strain (CARDIA)    Difficulty of Paying Living Expenses: Not hard at all  Food Insecurity: No Food Insecurity (12/31/2022)   Hunger Vital Sign    Worried About Running Out of Food in the Last Year: Never true    Ran Out of Food in the Last Year: Never true  Transportation Needs: No Transportation Needs (12/31/2022)   PRAPARE - Administrator, Civil Service (Medical): No    Lack of Transportation (Non-Medical): No  Physical Activity: Inactive (12/31/2022)   Exercise Vital Sign    Days of Exercise per Week: 0 days    Minutes of Exercise per Session: 0 min  Stress: No Stress Concern Present (12/31/2022)   [employer] of Occupational Health - Occupational Stress Questionnaire    Feeling of Stress : Only a little  Social Connections: Socially Integrated (12/31/2022)   Social Connection and Isolation Panel [NHANES]    Frequency of Communication with Friends and Family: More than three times a week    Frequency of Social Gatherings with Friends and Family: More than three times a week    Attends Religious  Services: More than 4 times per year    Active Member of Clubs or Organizations: Yes    Attends Engineer, structural: More than 4 times per year    Marital Status: Married    Allergies: No Known Allergies  Metabolic Disorder Labs: Lab Results  Component Value Date   HGBA1C 6.9 (H) 12/04/2022   MPG 131 01/25/2016   MPG 166 11/05/2014   No results found for: "PROLACTIN" Lab Results  Component Value Date   CHOL 119 08/27/2022   TRIG 83.0 08/27/2022   HDL 65.70 08/27/2022   CHOLHDL 2 08/27/2022   VLDL 16.6 08/27/2022   LDLCALC 37 08/27/2022   LDLCALC 57 09/25/2017   Lab Results  Component Value Date   TSH 0.59 01/16/2023   TSH 1.93 12/04/2022    Therapeutic Level Labs: No results found for: "LITHIUM" No results found for: "VALPROATE" No results found for: "CBMZ"  Current Medications: Current Outpatient Medications  Medication Sig Dispense Refill   aspirin EC 81 MG tablet Take 81 mg by mouth daily.     buPROPion (WELLBUTRIN XL) 300 MG 24 hr tablet Take 1 tablet (300 mg total) by mouth daily. 90 tablet 1   cholestyramine (QUESTRAN) 4 g packet Take 1 packet by mouth 2 (two) times daily. (Patient not taking: Reported on 12/31/2022)     ciclopirox (PENLAC) 8 % solution Apply topically at bedtime. Apply over nail and surrounding skin. Apply daily over previous coat. After seven (7) days, may remove with alcohol and continue cycle. (Patient not taking: Reported on 12/31/2022) 6.6 mL 0   Continuous Blood Gluc Sensor (FREESTYLE LIBRE 14 DAY SENSOR) MISC APPLY 1 SENSOR EVERY 14 DAYS TO MONITOR BLOOD GLUCOSE LEVELS. REMOVE OLD SENSOR BEFORE APPLYING NEW SENSOR 2 each 5   Continuous Glucose Sensor (FREESTYLE LIBRE 14 DAY SENSOR) MISC APPLY SENSOR EVERY 14 DAYS TO MONITOR BLOOD GLUCOSE LEVELS. REMOVE OLD SENSOR BEFORE APPLYING NEW SENSOR 2 each 5   dapagliflozin propanediol (FARXIGA) 10 MG TABS tablet Take 1 tablet (10 mg total) by mouth daily. 90 tablet 3    diphenoxylate-atropine (LOMOTIL) 2.5-0.025 MG tablet Take by mouth.     divalproex (DEPAKOTE ER) 500 MG 24 hr tablet Take 1 tablet by mouth 2 (two) times daily.     donepezil (ARICEPT) 10 MG tablet Take 10 mg by  mouth at bedtime.     DULoxetine (CYMBALTA) 30 MG capsule Take 2 capsules (60 mg total) by mouth daily. (Patient not taking: Reported on 01/18/2023) 60 capsule 3   empagliflozin (JARDIANCE) 10 MG TABS tablet Take 1 tablet by mouth daily.     fenofibrate (TRICOR) 145 MG tablet TAKE 1 TABLET BY MOUTH DAILY 90 tablet 1   gabapentin (NEURONTIN) 600 MG tablet TAKE TWO TABLETS BY MOUTH THREE TIMES A DAY 180 tablet 1   lidocaine (LIDODERM) 5 % Place 1 patch onto the skin every 12 (twelve) hours. Remove & Discard patch within 12 hours or as directed by MD 30 patch 2   linagliptin (TRADJENTA) 5 MG TABS tablet Take 1 tablet (5 mg total) by mouth daily. 30 tablet 5   lisinopril-hydrochlorothiazide (ZESTORETIC) 20-25 MG tablet TAKE 1 TABLET BY MOUTH TWICE A DAY 180 tablet 1   memantine (NAMENDA) 10 MG tablet Take 1 tablet by mouth daily.     metFORMIN (GLUCOPHAGE-XR) 500 MG 24 hr tablet TAKE 1 TABLET BY MOUTH TWICE A DAY 180 tablet 1   metoprolol succinate (TOPROL-XL) 25 MG 24 hr tablet Take 25 mg by mouth daily.     Multiple Vitamins-Minerals (CENTRUM SILVER PO) Take 1 tablet by mouth daily.     omega-3 acid ethyl esters (LOVAZA) 1 g capsule TAKE 2 CAPSULES BY MOUTH TWICE A DAY 360 capsule 3   omeprazole (PRILOSEC) 20 MG capsule Take 20 mg by mouth daily.     oxyCODONE-acetaminophen (PERCOCET) 10-325 MG tablet Take 1 tablet by mouth every 6 (six) hours as needed for pain. Must last 30 days. 120 tablet 0   [START ON 02/26/2023] oxyCODONE-acetaminophen (PERCOCET) 10-325 MG tablet Take 1 tablet by mouth every 6 (six) hours as needed for pain. Must last 30 days. 120 tablet 0   pantoprazole (PROTONIX) 40 MG tablet Take 40 mg by mouth daily.     pramipexole (MIRAPEX) 0.125 MG tablet TAKE 1 TABLET BY MOUTH  EVERY NIGHT 2-3 HOURS BEFORE BEDTIME 90 tablet 1   primidone (MYSOLINE) 50 MG tablet Take 150-250 mg by mouth 2 (two) times daily. Take 250 mg by mouth in the morning & take 150 mg at night.     QUEtiapine (SEROQUEL) 25 MG tablet Take 75 mg by mouth at bedtime.     rosuvastatin (CRESTOR) 40 MG tablet TAKE ONE TABLET BY MOUTH DAILY 90 tablet 3   tamsulosin (FLOMAX) 0.4 MG CAPS capsule TAKE 2 CAPSULES BY MOUTH DAILY AFTER SUPPER 180 capsule 0   traZODone (DESYREL) 100 MG tablet Take 2 tablets (200 mg total) by mouth at bedtime as needed for sleep. 180 tablet 0   vitamin C (ASCORBIC ACID) 500 MG tablet Take 1,000 mg by mouth daily.     No current facility-administered medications for this visit.     Musculoskeletal: Strength & Muscle Tone:  N/A Gait & Station:  N/A Patient leans: N/A  Psychiatric Specialty Exam: Review of Systems  There were no vitals taken for this visit.There is no height or weight on file to calculate BMI.  General Appearance: Well Groomed  Eye Contact:  Good  Speech:  Clear and Coherent  Volume:  Normal  Mood:   good  Affect:  Appropriate, Congruent, and Full Range  Thought Process:  Coherent  Orientation:  Full (Time, Place, and Person)  Thought Content: Logical   Suicidal Thoughts:  No  Homicidal Thoughts:  No  Memory:  Immediate;   Good  Judgement:  Good  Insight:  Good  Psychomotor Activity:  Normal  Concentration:  Concentration: Good and Attention Span: Good  Recall:  Good  Fund of Knowledge: Good  Language: Good  Akathisia:  No  Handed:  Right  AIMS (if indicated): not done  Assets:  Communication Skills Desire for Improvement  ADL's:  Intact  Cognition: WNL  Sleep:  Good   Screenings: GAD-7    Flowsheet Row Office Visit from 01/16/2023 in [facility] [employer] Visit from 12/04/2022 in [facility] [employer] Visit from 10/24/2022 in [facility]  [employer] Visit from 10/08/2022 in [facility] Counselor from 09/26/2022 in [facility]  Total GAD-7 Score 5 2 5 11 20       Mini-Mental    Flowsheet Row Clinical Support from 11/15/2017 in [facility] Clinical Support from 11/14/2016 in [facility] [employer]  Total Score (max 30 points ) 30 30      PHQ2-9    Flowsheet Row Office Visit from 01/16/2023 in [facility] [employer] Clinical Support from 12[facility]31/2022 in [facility] [employer] Visit from 12/20/2022 in [facility] Visit from 12/04/2022 in [facility] [employer] Visit from 10/24/2022 in [facility] [employer]  PHQ-2 Total Score 1 0 0 0 2  PHQ-9 Total Score 5 3 -- 1 4      Flowsheet Row Admission (Discharged) from 01/18/2023 in [facility] Counselor from 12/05/2022 in [facility] from 09/26/2022 in [facility] RISK CATEGORY No Risk No Risk No Risk        Assessment and Plan:  DAO STRITE is a 82 y.o. year old male with a history of  pseudo dementia, sleep disorder, tremor, history of QTc prolongation , who presents for follow up appointment for below.   1. MDD (major depressive disorder), recurrent, in partial remission (HCC) Acute stressors include: ankle pain, moving into a camper due to financial strain  Other stressors include: conflict with his step son, financial strain    History:   There has been a steady improvement in depressive symptoms, anxiety since the last visit.  In retrospect, he was likely experiencing mood symptoms  due to issues with adjustment.  However, he feels comfortable to stay on the current dose of duloxetine at this time.  Will continue duloxetine along with bupropion to target depression.  Noted that he is on quetiapine, gabapentin, and Depakote, which have been prescribed by another provider.  To avoid polypharmacy, will consider working on tapering off duloxetine in the future if his mood continues to be stable.   2. Insomnia, unspecified type Stable.  Will continue trazodone as needed for insomnia.  He is aware of his potential risk of drowsiness and fall especially with concomitant use of quetiapine, gabapentin and opioid.     # Alcohol use disorder in sustained remission  History:  He began drinking in college but has been abstinent since the age of 40. He actively participates in [employer]. Although he reports occasional craving, he has been abstinent from alcohol.  Will continue motivational interview.    # History of cognitive impairment Followed-up by neurologist, and has  been on memantine and donepezil. 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 Acute risk factors for suicide include: N/A. Protective factors for this patient include: positive social support, responsibility to others (children, family), coping skills and hope for the future. Considering these factors, the  overall suicide risk at this point appears to be low. Patient is appropriate for outpatient follow up.  Collaboration of Care: Collaboration of Care: Other reviewed notes in Epic  Patient/Guardian was advised Release of Information must be obtained prior to any record release in order to collaborate their care with an outside provider. Patient/Guardian was advised if they have not already done so to contact the registration department to sign all necessary forms in order for [place] to release information regarding their care.   Consent: Patient/Guardian gives verbal consent for treatment and assignment of benefits for services provided during this visit. Patient/Guardian expressed understanding and agreed to proceed.    James Hotter, MD 02/06/2023, 9:53 AM

## 2023-02-04 DIAGNOSIS — R197 Diarrhea, unspecified: Secondary | ICD-10-CM | POA: Diagnosis not present

## 2023-02-04 DIAGNOSIS — R1310 Dysphagia, unspecified: Secondary | ICD-10-CM | POA: Diagnosis not present

## 2023-02-04 DIAGNOSIS — K219 Gastro-esophageal reflux disease without esophagitis: Secondary | ICD-10-CM | POA: Diagnosis not present

## 2023-02-05 DIAGNOSIS — H532 Diplopia: Secondary | ICD-10-CM | POA: Diagnosis not present

## 2023-02-06 ENCOUNTER — Encounter: Payer: Self-pay | Admitting: Psychiatry

## 2023-02-06 ENCOUNTER — Telehealth (INDEPENDENT_AMBULATORY_CARE_PROVIDER_SITE_OTHER): Payer: PPO | Admitting: Psychiatry

## 2023-02-06 DIAGNOSIS — G4700 Insomnia, unspecified: Secondary | ICD-10-CM | POA: Diagnosis not present

## 2023-02-06 DIAGNOSIS — F3341 Major depressive disorder, recurrent, in partial remission: Secondary | ICD-10-CM | POA: Diagnosis not present

## 2023-02-06 NOTE — Patient Instructions (Signed)
Continue bupropion XR 300 mg daily  Continue duloxetine 60 mg daily  Continue Trazodone 200 mg at night as needed for insomnia  Next appointment: in December- please contact our office to make a follow up appointment

## 2023-02-11 DIAGNOSIS — E119 Type 2 diabetes mellitus without complications: Secondary | ICD-10-CM | POA: Diagnosis not present

## 2023-02-11 DIAGNOSIS — H538 Other visual disturbances: Secondary | ICD-10-CM | POA: Diagnosis not present

## 2023-02-11 DIAGNOSIS — H532 Diplopia: Secondary | ICD-10-CM | POA: Diagnosis not present

## 2023-02-11 DIAGNOSIS — H3581 Retinal edema: Secondary | ICD-10-CM | POA: Diagnosis not present

## 2023-02-11 DIAGNOSIS — H2513 Age-related nuclear cataract, bilateral: Secondary | ICD-10-CM | POA: Diagnosis not present

## 2023-02-20 ENCOUNTER — Ambulatory Visit
Payer: PPO | Attending: Student in an Organized Health Care Education/Training Program | Admitting: Student in an Organized Health Care Education/Training Program

## 2023-02-20 ENCOUNTER — Encounter: Payer: Self-pay | Admitting: Student in an Organized Health Care Education/Training Program

## 2023-02-20 ENCOUNTER — Ambulatory Visit
Admission: RE | Admit: 2023-02-20 | Discharge: 2023-02-20 | Disposition: A | Discharge status: Home / Self Care | Payer: PPO | Source: Ambulatory Visit | Attending: Student in an Organized Health Care Education/Training Program | Admitting: Student in an Organized Health Care Education/Training Program

## 2023-02-20 VITALS — BP 133/79 | HR 70 | Temp 97.2°F | Resp 16 | Ht 70.0 in | Wt 165.0 lb

## 2023-02-20 DIAGNOSIS — M5386 Other specified dorsopathies, lumbar region: Secondary | ICD-10-CM | POA: Insufficient documentation

## 2023-02-20 DIAGNOSIS — M48061 Spinal stenosis, lumbar region without neurogenic claudication: Secondary | ICD-10-CM | POA: Insufficient documentation

## 2023-02-20 DIAGNOSIS — M5417 Radiculopathy, lumbosacral region: Secondary | ICD-10-CM | POA: Diagnosis not present

## 2023-02-20 DIAGNOSIS — H2511 Age-related nuclear cataract, right eye: Secondary | ICD-10-CM | POA: Diagnosis not present

## 2023-02-20 MED ORDER — IOHEXOL 180 MG/ML  SOLN
10.0000 mL | Freq: Once | INTRAMUSCULAR | Status: AC
  Administered 2023-02-20: 10 mL via EPIDURAL
  Filled 2023-02-20: qty 20

## 2023-02-20 MED ORDER — SODIUM CHLORIDE 0.9% FLUSH
2.0000 mL | Freq: Once | INTRAVENOUS | Status: AC
  Administered 2023-02-20: 2 mL

## 2023-02-20 MED ORDER — ROPIVACAINE HCL 2 MG/ML IJ SOLN
2.0000 mL | Freq: Once | INTRAMUSCULAR | Status: AC
  Administered 2023-02-20: 2 mL via EPIDURAL
  Filled 2023-02-20: qty 20

## 2023-02-20 MED ORDER — LIDOCAINE HCL 2 % IJ SOLN
20.0000 mL | Freq: Once | INTRAMUSCULAR | Status: AC
  Administered 2023-02-20: 400 mg
  Filled 2023-02-20: qty 20

## 2023-02-20 MED ORDER — DEXAMETHASONE SODIUM PHOSPHATE 10 MG/ML IJ SOLN
20.0000 mg | Freq: Once | INTRAMUSCULAR | Status: AC
  Administered 2023-02-20: 20 mg
  Filled 2023-02-20: qty 2

## 2023-02-20 MED ORDER — DEXAMETHASONE SODIUM PHOSPHATE 10 MG/ML IJ SOLN
INTRAMUSCULAR | Status: AC
  Filled 2023-02-20: qty 1

## 2023-02-20 NOTE — Progress Notes (Signed)
PROVIDER NOTE: Interpretation of information contained herein should be left to medically-trained personnel. Specific patient instructions are provided elsewhere under "Patient Instructions" section of medical record. This document was created in part using STT-dictation technology, any transcriptional errors that may result from this process are unintentional.  Patient: James Hood Type: Established DOB: 1941-01-18 MRN: [id number] PCP: Glori Luis, MD  Service: Procedure DOS: 02/20/2023 Setting: Ambulatory Location: Ambulatory outpatient facility Delivery: Face-to-face Provider: Edward Jolly, MD Specialty: Interventional Pain Management Specialty designation: 09 Location: Outpatient facility Ref. Prov.: Glori Luis, MD       Interventional Therapy   Procedure: Lumbar trans-foraminal epidural steroid injection (L-TFESI) #1  Laterality: Left (-LT)  Level: L4 and L5  nerve root(s) Imaging: Fluoroscopy-guided         Anesthesia: Local anesthesia (1-2% Lidocaine) Sedation: Minimal Sedation                       DOS: 02/20/2023  Performed by: Edward Jolly, MD  Purpose: Diagnostic/Therapeutic Indications: Lumbar radicular pain severe enough to impact quality of life or function. 1. 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 Lumbosacral radiculopathy   3. Sciatica of left side associated with disorder of lumbar spine    NAS-11 Pain score:   Pre-procedure: 6 /10   Post-procedure: 0-No pain/10     Position / Prep / Materials:  Position: Prone  Prep solution: DuraPrep (Iodine Povacrylex [0.7% available iodine] and Isopropyl Alcohol, 74% w/w) Prep Area: Entire Posterior Lumbosacral Area.  From the lower tip of the scapula down to the tailbone and from flank to flank. Materials:  Tray: Block Needle(s):  Type: Spinal  Gauge (G): 22  Length: 3.5-in  Qty: 2      H&P (Pre-op Assessment):  James Hood is a 82 y.o. (year old), male  patient, seen today for interventional treatment. 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 James Hood has a current medication list which includes the following prescription(s): aspirin ec, bupropion, freestyle libre 14 day sensor, freestyle libre 14 day sensor, dapagliflozin propanediol, diphenoxylate-atropine, divalproex, donepezil, duloxetine, empagliflozin, fenofibrate, gabapentin, lidocaine, linagliptin, lisinopril-hydrochlorothiazide, memantine, metformin, metoprolol succinate, multiple vitamins-minerals, omega-3 acid ethyl esters, omeprazole, oxycodone-acetaminophen, pantoprazole, pramipexole, primidone, quetiapine, rosuvastatin, tamsulosin, trazodone, ascorbic acid, cholestyramine, ciclopirox, and [START ON 02/26/2023] oxycodone-acetaminophen. His primarily concern today is the Back Pain and Ankle Pain (LEFT)  Initial Vital Signs:  Pulse/HCG Rate: 70  Temp: (!) 97.2 F (36.2 C) Resp: 16 BP: 132/79 SpO2: 97 %  BMI: Estimated body mass index is 23.68 kg/m as calculated from the following:   Height as of this encounter: 5\' 10"  (1.778 m).   Weight as of this encounter: 165 lb (74.8 kg).  Risk Assessment: Allergies: Reviewed. He has No Known Allergies.  Allergy Precautions: None required Coagulopathies: Reviewed. None identified.  Blood-thinner therapy: None at this time Active Infection(s): Reviewed. None identified. James Hood is afebrile  Site  Confirmation: James Hood was asked to confirm the procedure and laterality before marking the site Procedure checklist: Completed Consent: Before the procedure and under the influence of no sedative(s), amnesic(s), or anxiolytics, the patient  was informed of the treatment options, risks and possible complications. To fulfill our ethical and legal obligations, as recommended by the American Medical Association's Code of Ethics, I have informed the patient of my clinical impression; the nature and purpose of the treatment or procedure; the risks, benefits, and possible complications of the intervention; the alternatives, including doing nothing; the risk(s) and benefit(s) of the alternative treatment(s) or procedure(s); and the risk(s) and benefit(s) of doing nothing. The patient was provided information about the general risks and possible complications associated with the procedure. These may include, but are not limited to: failure to achieve desired goals, infection, bleeding, organ or nerve damage, allergic reactions, paralysis, and death. In addition, the patient was informed of those risks and complications associated to Spine-related procedures, such as failure to decrease pain; infection (i.e.: Meningitis, epidural or intraspinal abscess); bleeding (i.e.: epidural hematoma, subarachnoid hemorrhage, or any other type of intraspinal or peri-dural bleeding); organ or nerve damage (i.e.: Any type of peripheral nerve, nerve root, or spinal cord injury) with subsequent damage to sensory, motor, and/or autonomic systems, resulting in permanent pain, numbness, and/or weakness of one or several areas of the body; allergic reactions; (i.e.: anaphylactic reaction); and/or death. Furthermore, the patient was informed of those risks and complications associated with the medications. These include, but are not limited to: allergic reactions (i.e.: anaphylactic or anaphylactoid reaction(s)); adrenal axis suppression;  blood sugar elevation that in diabetics may result in ketoacidosis or comma; water retention that in patients with history of congestive heart failure may result in shortness of breath, pulmonary edema, and decompensation with resultant heart failure; weight gain; swelling or edema; medication-induced neural toxicity; particulate matter embolism and blood vessel occlusion with resultant organ, and/or nervous system infarction; and/or aseptic necrosis of one or more joints. Finally, the patient was informed that Medicine is not an exact science; therefore, there is also the possibility of unforeseen or unpredictable risks and/or possible complications that may result in a catastrophic outcome. The patient indicated having understood very clearly. We have given the patient no guarantees and we have made no promises. Enough time was given to the patient to ask questions, all of which were answered to the patient's satisfaction. James Hood has indicated that he wanted to continue with the procedure. Attestation: I, the ordering provider, attest that I have discussed with the patient the benefits, risks, side-effects, alternatives, likelihood of achieving goals, and potential problems during recovery for the procedure that I have provided informed consent. Date  Time: 02/20/2023 10:00 AM   Pre-Procedure Preparation:  Monitoring: As per clinic protocol. Respiration, ETCO2, SpO2, BP, heart rate and rhythm monitor placed and checked for adequate function Safety Precautions: Patient was assessed for positional comfort and pressure points before starting the procedure. Time-out: I initiated and conducted the "Time-out" before starting the procedure, as per protocol. The patient was asked to participate by confirming the accuracy of the "Time Out" information. Verification of the correct person, site, and procedure were performed and confirmed by me, the nursing staff, and the patient. "Time-out" conducted as per  Joint Commission's Universal Protocol (UP.01.01.01). Time: 1055 Start Time: 1055 hrs.  Description/Narrative of Procedure:          Target: The 6 o'clock position under the pedicle, on the affected side. Region: Posterolateral Lumbosacral Approach: Posterior Percutaneous Paravertebral approach.  Rationale (medical necessity): procedure needed and proper for the diagnosis and/or treatment of the patient's medical symptoms and needs. Procedural Technique Safety Precautions: Aspiration looking for blood return was  conducted prior to all injections. At no point did we inject any substances, as a needle was being advanced. No attempts were made at seeking any paresthesias. Safe injection practices and needle disposal techniques used. Medications properly checked for expiration dates. SDV (single dose vial) medications used. Description of the Procedure: Protocol guidelines were followed. The patient was placed in position over the procedure table. The target area was identified and the area prepped in the usual manner. Skin & deeper tissues infiltrated with local anesthetic. Appropriate amount of time allowed to pass for local anesthetics to take effect. The procedure needles were then advanced to the target area. Proper needle placement secured. Negative aspiration confirmed. Solution injected in intermittent fashion, asking for systemic symptoms every 0.5cc of injectate. The needles were then removed and the area cleansed, making sure to leave some of the prepping solution back to take advantage of its long term bactericidal properties.  Vitals:   02/20/23 1047 02/20/23 1052 02/20/23 1057 02/20/23 1100  BP: 124/77 124/75 126/74 133/79  Pulse: (!) 57 (!) 57 73 70  Resp: 13 16 17 16   Temp:      SpO2: 95% 96% 96% 95%  Weight:      Height:        Start Time: 1055 hrs. End Time: 1058 hrs.  Imaging Guidance (Spinal):          Type of Imaging Technique: Fluoroscopy Guidance (Spinal) Indication(s):  Assistance in needle guidance and placement for procedures requiring needle placement in or near specific anatomical locations not easily accessible without such assistance. Exposure Time: Please see nurses notes. Contrast: Before injecting any contrast, we confirmed that the patient did not have an allergy to iodine, shellfish, or radiological contrast. Once satisfactory needle placement was completed at the desired level, radiological contrast was injected. Contrast injected under live fluoroscopy. No contrast complications. See chart for type and volume of contrast used. Fluoroscopic Guidance: I was personally present during the use of fluoroscopy. "Tunnel Vision Technique" used to obtain the best possible view of the target area. Parallax error corrected before commencing the procedure. "Direction-depth-direction" technique used to introduce the needle under continuous pulsed fluoroscopy. Once target was reached, antero-posterior, oblique, and lateral fluoroscopic projection used confirm needle placement in all planes. Images permanently stored in EMR. Interpretation: I personally interpreted the imaging intraoperatively. Adequate needle placement confirmed in multiple planes. Appropriate spread of contrast into desired area was observed. No evidence of afferent or efferent intravascular uptake. No intrathecal or subarachnoid spread observed. Permanent images saved into the patient's record.  Post-operative Assessment:  Post-procedure Vital Signs:  Pulse/HCG Rate: 70  Temp: (!) 97.2 F (36.2 C) Resp: 16 BP: 133/79 SpO2: 95 %  EBL: None  Complications: No immediate post-treatment complications observed by team, or reported by patient.  Note: The patient tolerated the entire procedure well. A repeat set of vitals were taken after the procedure and the patient was kept under observation following institutional policy, for this type of procedure. Post-procedural neurological assessment was  performed, showing return to baseline, prior to discharge. The patient was provided with post-procedure discharge instructions, including a section on how to identify potential problems. Should any problems arise concerning this procedure, the patient was given instructions to immediately contact us, at any time, without hesitation. In any case, we plan to contact the patient by telephone for a follow-up status report regarding this interventional procedure.  Comments:  No additional relevant information.  Plan of Care (POC)  Orders:  Orders Placed This  Encounter  Procedures   DG PAIN CLINIC C-ARM 1-60 MIN NO REPORT    Intraoperative interpretation by procedural physician at [facility].    Standing Status:   Standing    Number of Occurrences:   1    Order Specific Question:   Reason for exam:    Answer:   Assistance in needle guidance and placement for procedures requiring needle placement in or near specific anatomical locations not easily accessible without such assistance.   Chronic Opioid Analgesic:   Oxycodone 10 mg every 6 hours as needed, quantity 120/month; MME equals 60    Medications ordered for procedure: Meds ordered this encounter  Medications   iohexol (OMNIPAQUE) 180 MG/ML injection 10 mL    Must be Myelogram-compatible. If not available, you may substitute with a water-soluble, non-ionic, hypoallergenic, myelogram-compatible radiological contrast medium.   lidocaine (XYLOCAINE) 2 % (with pres) injection 400 mg   sodium chloride flush (NS) 0.9 % injection 2 mL    This is for a two (2) level block. Use two (2) syringes and divide content in half.   ropivacaine (PF) 2 mg/mL (0.2%) (NAROPIN) injection 2 mL    This is for a two (2) level block. Use two (2) syringes and divide content in half.   dexamethasone (DECADRON) injection 20 mg    This is for a two (2) level block. Use two (2) syringes and divide content in half.   Medications administered: We administered  iohexol, lidocaine, sodium chloride flush, ropivacaine (PF) 2 mg/mL (0.2%), and dexamethasone.  See the medical record for exact dosing, route, and time of administration.  Follow-up plan:   Return in about 4 weeks (around 03/20/2023) for PPE, VV.       Left L4, L5 transforaminal ESI 02/20/2023    Recent Visits Date Type Provider Dept  01/31/23 Office Visit Edward Jolly, MD [facility]  12/20/22 Office Visit Edward Jolly, MD [facility]  11/28/22 Office Visit Edward Jolly, MD [facility]  Showing recent visits within past 90 days and meeting all other requirements Today's Visits Date Type Provider Dept  02/20/23 Procedure visit Edward Jolly, MD [facility]  Showing today's visits and meeting all other requirements Future Appointments Date Type Provider Dept  03/07/23 Appointment Edward Jolly, MD [facility]  03/25/23 Appointment Edward Jolly, MD [facility]  Showing future appointments within next 90 days and meeting all other requirements  Disposition: Discharge home  Discharge (Date  Time): 02/20/2023;   hrs.   Primary Care Physician: Glori Luis, MD Location: [facility] Note by: Edward Jolly, MD (TTS technology used. I apologize for any typographical errors that were not detected and corrected.) Date: 02/20/2023; Time: 11:37 AM  Disclaimer:  Medicine is not an Visual merchandiser. The only guarantee in medicine is that nothing is guaranteed. It is important to note that the decision to proceed with this intervention was based on the information collected from the patient. The Data and conclusions were drawn from the patient's questionnaire, the interview, and the physical examination. Because the information was provided in large part by the patient, it cannot be guaranteed that it has not been purposely or unconsciously manipulated. Every effort has been made to obtain as much  relevant data as possible for this evaluation. It is important to note that the conclusions that lead to this procedure are derived in large part from the available data. Always take into account that the treatment will also  be dependent on availability of resources and existing treatment guidelines, considered by other Pain Management Practitioners as being common knowledge and practice, at the time of the intervention. For Medico-Legal purposes, it is also important to point out that variation in procedural techniques and pharmacological choices are the acceptable norm. The indications, contraindications, technique, and results of the above procedure should only be interpreted and judged by a Board-Certified Interventional Pain Specialist with extensive familiarity and expertise in the same exact procedure and technique.

## 2023-02-20 NOTE — Patient Instructions (Signed)

## 2023-02-20 NOTE — Progress Notes (Signed)
Safety precautions to be maintained throughout the outpatient stay will include: orient to surroundings, keep bed in low position, maintain call bell within reach at all times, provide assistance with transfer out of bed and ambulation.  

## 2023-02-21 ENCOUNTER — Telehealth: Payer: Self-pay | Admitting: *Deleted

## 2023-02-21 NOTE — Telephone Encounter (Signed)
No problems post procedure. 

## 2023-02-22 ENCOUNTER — Other Ambulatory Visit: Payer: Self-pay | Admitting: Psychiatry

## 2023-02-22 ENCOUNTER — Other Ambulatory Visit: Payer: Self-pay | Admitting: Family Medicine

## 2023-02-27 ENCOUNTER — Telehealth: Payer: Self-pay | Admitting: Family Medicine

## 2023-02-27 ENCOUNTER — Ambulatory Visit: Payer: PPO | Admitting: Family Medicine

## 2023-02-27 ENCOUNTER — Encounter: Payer: Self-pay | Admitting: Family Medicine

## 2023-02-27 VITALS — BP 130/74 | HR 79 | Temp 98.0°F | Ht 70.0 in | Wt 166.8 lb

## 2023-02-27 DIAGNOSIS — I10 Essential (primary) hypertension: Secondary | ICD-10-CM | POA: Diagnosis not present

## 2023-02-27 DIAGNOSIS — Z720 Tobacco use: Secondary | ICD-10-CM

## 2023-02-27 DIAGNOSIS — M5417 Radiculopathy, lumbosacral region: Secondary | ICD-10-CM | POA: Diagnosis not present

## 2023-02-27 DIAGNOSIS — E1142 Type 2 diabetes mellitus with diabetic polyneuropathy: Secondary | ICD-10-CM

## 2023-02-27 DIAGNOSIS — Z7984 Long term (current) use of oral hypoglycemic drugs: Secondary | ICD-10-CM | POA: Diagnosis not present

## 2023-02-27 MED ORDER — CYCLOBENZAPRINE HCL 10 MG PO TABS: 10.0000 mg | ORAL_TABLET | Freq: Three times a day (TID) | ORAL | 0 refills | Status: DC | PRN

## 2023-02-27 NOTE — Telephone Encounter (Signed)
Please call the patient.  I forgot to confirm this when he was in the office.  He has [place] and [place] on his medication list.  Please confirm which of these he is taking.  He should not be taking both.  Thanks.

## 2023-02-27 NOTE — Assessment & Plan Note (Signed)
Chronic issue.  Sugars have trended up some recently though seem to be adequately controlled over the last 90 days.  For now he will continue Farxiga 10 mg daily, Tradjenta 5 mg daily, and metformin 500 mg twice daily.  I will have my medical assistant contact the patient to confirm he is not also taking Jardiance.  He will return in a couple of weeks to have his A1c checked.

## 2023-02-27 NOTE — Assessment & Plan Note (Signed)
Chronic issue.  Adequately controlled.  Patient will continue lisinopril-HCTZ 1 tablet twice daily and metoprolol 25 mg daily.

## 2023-02-27 NOTE — Assessment & Plan Note (Addendum)
Chronic issue.  Patient will continue to follow with pain management.  Patient did request a refill on cyclobenzaprine 10 mg tablets.  He rarely takes these and was prescribed these at [facility] to help with his discomfort.  Notes these do not make him drowsy.  Refill provided.  Advised not to drive if he is drowsy while taking these.

## 2023-02-27 NOTE — Assessment & Plan Note (Signed)
Chronic issue.  Encouraged smoking cessation.  Patient declines.

## 2023-02-27 NOTE — Telephone Encounter (Signed)
Patient states he is taking both. Please advise

## 2023-02-27 NOTE — Patient Instructions (Signed)
Nice to see you!   

## 2023-02-27 NOTE — Progress Notes (Signed)
[name], MD Phone: [phone]  James Hood is a 82 y.o. male who presents today for follow-up.  Lumbar radiculopathy: Patient notes an epidural recently to help with radicular pain into his ankle.  He notes he is gotten some benefit from this.  He continues to follow with pain management for this.  Diabetes: Last 90 days 17% above range, 81% in range, and 2% below range.  Patient denies any hypoglycemic symptoms.  He does have polyuria that is chronic and unchanged.  He notes more recently his sugars have been worse.  He has been eating more sandwiches and bread.  He is on Tradjenta and metformin.  Hypertension: Not checking blood pressures.  He is taking lisinopril-HCTZ and metoprolol.  No chest pain, shortness with, or edema.  Tobacco abuse: Patient does not want to quit smoking.  Social History   Tobacco Use  Smoking Status Every Day   Current packs/day: 0.50   Average packs/day: 0.5 packs/day for 60.0 years (30.0 ttl pk-yrs)   Types: Cigarettes  Smokeless Tobacco Never    Current Outpatient Medications on File Prior to Visit  Medication Sig Dispense Refill   aspirin EC 81 MG tablet Take 81 mg by mouth daily.     cholestyramine (QUESTRAN) 4 g packet Take 1 packet by mouth 2 (two) times daily.     Continuous Blood Gluc Sensor (FREESTYLE LIBRE 14 DAY SENSOR) MISC APPLY 1 SENSOR EVERY 14 DAYS TO MONITOR BLOOD GLUCOSE LEVELS. REMOVE OLD SENSOR BEFORE APPLYING NEW SENSOR 2 each 5   Continuous Glucose Sensor (FREESTYLE LIBRE 14 DAY SENSOR) MISC APPLY SENSOR EVERY 14 DAYS TO MONITOR BLOOD GLUCOSE LEVELS. REMOVE OLD SENSOR BEFORE APPLYING NEW SENSOR 2 each 5   dapagliflozin propanediol (FARXIGA) 10 MG TABS tablet Take 1 tablet (10 mg total) by mouth daily. 90 tablet 3   diphenoxylate-atropine (LOMOTIL) 2.5-0.025 MG tablet Take by mouth.     divalproex (DEPAKOTE ER) 500 MG 24 hr tablet Take 1 tablet by mouth 2 (two) times daily.     donepezil (ARICEPT) 10 MG tablet Take 10  mg by mouth at bedtime.     DULoxetine (CYMBALTA) 30 MG capsule Take 2 capsules (60 mg total) by mouth daily. 60 capsule 3   empagliflozin (JARDIANCE) 10 MG TABS tablet Take 1 tablet by mouth daily.     fenofibrate (TRICOR) 145 MG tablet TAKE 1 TABLET BY MOUTH DAILY 90 tablet 1   gabapentin (NEURONTIN) 600 MG tablet TAKE TWO TABLETS BY MOUTH THREE TIMES A DAY 180 tablet 1   lidocaine (LIDODERM) 5 % Place 1 patch onto the skin every 12 (twelve) hours. Remove & Discard patch within 12 hours or as directed by MD 30 patch 2   linagliptin (TRADJENTA) 5 MG TABS tablet Take 1 tablet (5 mg total) by mouth daily. 30 tablet 5   lisinopril-hydrochlorothiazide (ZESTORETIC) 20-25 MG tablet TAKE 1 TABLET BY MOUTH 2 TIMES A DAY 180 tablet 1   memantine (NAMENDA) 10 MG tablet Take 1 tablet by mouth daily.     metFORMIN (GLUCOPHAGE-XR) 500 MG 24 hr tablet TAKE 1 TABLET BY MOUTH TWICE A DAY 180 tablet 1   metoprolol succinate (TOPROL-XL) 25 MG 24 hr tablet Take 25 mg by mouth daily.     Multiple Vitamins-Minerals (CENTRUM SILVER PO) Take 1 tablet by mouth daily.     omega-3 acid ethyl esters (LOVAZA) 1 g capsule TAKE 2 CAPSULES BY MOUTH TWICE A DAY 360 capsule 3   omeprazole (PRILOSEC) 20 MG capsule  Take 20 mg by mouth daily.     pantoprazole (PROTONIX) 40 MG tablet Take 40 mg by mouth daily.     pramipexole (MIRAPEX) 0.125 MG tablet TAKE 1 TABLET BY MOUTH EVERY NIGHT 2-3 HOURS BEFORE BEDTIME 90 tablet 1   primidone (MYSOLINE) 50 MG tablet Take 150-250 mg by mouth 2 (two) times daily. Take 250 mg by mouth in the morning & take 150 mg at night.     QUEtiapine (SEROQUEL) 25 MG tablet Take 75 mg by mouth at bedtime.     rosuvastatin (CRESTOR) 40 MG tablet TAKE ONE TABLET BY MOUTH DAILY 90 tablet 3   tamsulosin (FLOMAX) 0.4 MG CAPS capsule TAKE 2 CAPSULES BY MOUTH DAILY AFTER SUPPER 180 capsule 0   [START ON 03/02/2023] traZODone (DESYREL) 100 MG tablet Take 2 tablets (200 mg total) by mouth at bedtime as needed for  sleep. 180 tablet 1   vitamin C (ASCORBIC ACID) 500 MG tablet Take 1,000 mg by mouth daily.     buPROPion (WELLBUTRIN XL) 300 MG 24 hr tablet Take 1 tablet (300 mg total) by mouth daily. 90 tablet 1   No current facility-administered medications on file prior to visit.     ROS see history of present illness  Objective  Physical Exam Vitals:   02/27/23 0818  BP: 130/74  Pulse: 79  Temp: 98 F (36.7 C)  SpO2: 94%    BP Readings from Last 3 Encounters:  02/27/23 130/74  02/20/23 133/79  01/31/23 121/71   Wt Readings from Last 3 Encounters:  02/27/23 166 lb 12.8 oz (75.7 kg)  02/20/23 165 lb (74.8 kg)  01/31/23 165 lb (74.8 kg)    Physical Exam Constitutional:      General: He is not in acute distress.    Appearance: He is not diaphoretic.  Cardiovascular:     Rate and Rhythm: Normal rate and regular rhythm.     Heart sounds: Normal heart sounds.  Pulmonary:     Effort: Pulmonary effort is normal.     Breath sounds: Normal breath sounds.  Skin:    General: Skin is warm and dry.  Neurological:     Mental Status: He is alert.    Diabetic Foot Exam - Simple   Simple Foot Form Diabetic Foot exam was performed with the following findings: Yes 02/27/2023  9:07 AM  Visual Inspection No deformities, no ulcerations, no other skin breakdown bilaterally: Yes Sensation Testing Intact to touch and monofilament testing bilaterally: Yes Pulse Check Posterior Tibialis and Dorsalis pulse intact bilaterally: Yes Comments Patient does have dry flaky skin bilateral feet      Assessment/Plan: Please see individual problem list.  Type 2 diabetes mellitus with diabetic polyneuropathy, without long-term current use of insulin (HCC) Assessment & Plan: Chronic issue.  Sugars have trended up some recently though seem to be adequately controlled over the last 90 days.  For now he will continue Farxiga 10 mg daily, Tradjenta 5 mg daily, and metformin 500 mg twice daily.  I will have  my medical assistant contact the patient to confirm he is not also taking Jardiance.  He will return in a couple of weeks to have his A1c checked.  Orders: -     POCT glycosylated hemoglobin (Hb A1C); Future  Primary hypertension Assessment & Plan: Chronic issue.  Adequately controlled.  Patient will continue lisinopril-HCTZ 1 tablet twice daily and metoprolol 25 mg daily.   Lumbosacral radiculopathy Assessment & Plan: Chronic issue.  Patient will continue to  follow with pain management.  Patient did request a refill on cyclobenzaprine 10 mg tablets.  He rarely takes these and was prescribed these at [facility] to help with his discomfort.  Notes these do not make him drowsy.  Refill provided.  Advised not to drive if he is drowsy while taking these.   Tobacco abuse Assessment & Plan: Chronic issue.  Encouraged smoking cessation.  Patient declines.   Other orders -     Cyclobenzaprine HCl; Take 1 tablet (10 mg total) by mouth 3 (three) times daily as needed for muscle spasms.  Dispense: 30 tablet; Refill: 0     Return in about 2 weeks (around 03/13/2023) for A1c with nurse visit, 4 months for transfer of care with kacy.   [name], MD [facility]

## 2023-02-28 NOTE — Telephone Encounter (Signed)
Noted. He should stop the jardiance. He should only be on the farxiga.

## 2023-02-28 NOTE — Telephone Encounter (Signed)
Let Patient know to stop the Jardiance and only take the [place]. Patient understands and is agreeable.

## 2023-03-05 ENCOUNTER — Encounter: Payer: Self-pay | Admitting: Ophthalmology

## 2023-03-07 ENCOUNTER — Encounter: Payer: Self-pay | Admitting: Student in an Organized Health Care Education/Training Program

## 2023-03-07 ENCOUNTER — Ambulatory Visit
Payer: PPO | Attending: Student in an Organized Health Care Education/Training Program | Admitting: Student in an Organized Health Care Education/Training Program

## 2023-03-07 VITALS — BP 110/66 | HR 83 | Temp 97.6°F | Ht 70.0 in | Wt 165.0 lb

## 2023-03-07 DIAGNOSIS — G8929 Other chronic pain: Secondary | ICD-10-CM | POA: Insufficient documentation

## 2023-03-07 DIAGNOSIS — G894 Chronic pain syndrome: Secondary | ICD-10-CM | POA: Diagnosis not present

## 2023-03-07 DIAGNOSIS — M48061 Spinal stenosis, lumbar region without neurogenic claudication: Secondary | ICD-10-CM | POA: Diagnosis not present

## 2023-03-07 DIAGNOSIS — M19072 Primary osteoarthritis, left ankle and foot: Secondary | ICD-10-CM | POA: Diagnosis not present

## 2023-03-07 DIAGNOSIS — M5417 Radiculopathy, lumbosacral region: Secondary | ICD-10-CM | POA: Diagnosis not present

## 2023-03-07 DIAGNOSIS — M5386 Other specified dorsopathies, lumbar region: Secondary | ICD-10-CM | POA: Insufficient documentation

## 2023-03-07 DIAGNOSIS — M25572 Pain in left ankle and joints of left foot: Secondary | ICD-10-CM | POA: Diagnosis not present

## 2023-03-07 MED ORDER — OXYCODONE-ACETAMINOPHEN 10-325 MG PO TABS: 1.0000 | ORAL_TABLET | Freq: Four times a day (QID) | ORAL | 0 refills | Status: DC | PRN

## 2023-03-07 MED ORDER — OXYCODONE-ACETAMINOPHEN 10-325 MG PO TABS: 1.0000 | ORAL_TABLET | Freq: Four times a day (QID) | ORAL | 0 refills | Status: AC | PRN

## 2023-03-07 NOTE — Discharge Instructions (Signed)

## 2023-03-07 NOTE — Progress Notes (Signed)
PROVIDER NOTE: Information contained herein reflects review and annotations entered in association with encounter. Interpretation of such information and data should be left to medically-trained personnel. Information provided to patient can be located elsewhere in the medical record under "Patient Instructions". Document created using STT-dictation technology, any transcriptional errors that may result from process are unintentional.    Patient: James Hood  Service Category: E/M  Provider: Edward Jolly, MD  DOB: [birth year]  DOS: 03/07/2023  Referring Provider: Glori Luis, MD  MRN: [id number]  Specialty: Interventional Pain Management  PCP: Glori Luis, MD  Type: Established Patient  Setting: Ambulatory outpatient    Location: Office  Delivery: Face-to-face     HPI  [name], a 82 y.o. 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 [name]'s primary complain today is Ankle Pain (left)  Pertinent problems: [name] has Primary localized osteoarthritis of right knee; DJD (degenerative joint disease) of knee; Lumbar herniated disc; Chronic pain syndrome; Failed back surgical syndrome; Lumbosacral radiculopathy; Primary osteoarthritis of left ankle; Lumbar facet joint syndrome; Neuropathy; Chronic pain of left ankle; and Osteochondral defect of ankle on their pertinent problem list. Pain Assessment: Severity of Chronic pain is reported as a 2 /10. Location: Ankle Left/denies. Onset: More than a month ago. Quality: Aching, Constant, Discomfort. Timing: Constant. Modifying factor(s): denies. Vitals:  height is 5\' 10"  (1.778 m) and weight is 165 lb (74.8 kg). His temporal temperature is 97.6 F (36.4 C). His blood pressure is 110/66 and his pulse is 83. His oxygen saturation is 96%.  BMI: Estimated body mass index is 23.68 kg/m as calculated from the following:   Height as of this encounter: 5\' 10"  (1.778 m).    Weight as of this encounter: 165 lb (74.8 kg). Last encounter: 12/20/2022. Last procedure: Visit date not found.  Reason for encounter: both, medication management and post-procedure evaluation and assessment.    Post-procedure evaluation   Procedure: Lumbar trans-foraminal epidural steroid injection (L-TFESI) #1  Laterality: Left (-LT)  Level: L4 and L5  nerve root(s) Imaging: Fluoroscopy-guided         Anesthesia: Local anesthesia (1-2% Lidocaine) Sedation: Minimal Sedation                       DOS: 02/20/2023  Performed by: Edward Jolly, MD  Purpose: Diagnostic/Therapeutic Indications: Lumbar radicular pain severe enough to impact quality of life or function. 1. 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 Lumbosacral radiculopathy   3. Sciatica of left side associated with disorder of lumbar spine    NAS-11 Pain score:   Pre-procedure: 6 /10   Post-procedure: 0-No pain/10      Effectiveness:  Initial hour after procedure: 100 %  Subsequent 4-6 hours post-procedure: 100 %  Analgesia past initial 6 hours: 90 %  Ongoing improvement:  Analgesic:  75%, radicular pain intensity returning and patient would like to repeat Function: Somewhat improved ROM: Somewhat improved   ROS  Constitutional: Denies any fever or chills Gastrointestinal: No reported hemesis, hematochezia, vomiting, or acute GI distress Musculoskeletal:  Low back pain which radiates into left leg, left ankle pain Neurological: No reported episodes of acute onset apraxia, aphasia, dysarthria, agnosia, amnesia, paralysis, loss of coordination, or loss of consciousness  Medication Review  DULoxetine, FreeStyle Libre 14 Day Sensor, Multiple Vitamins-Minerals, QUEtiapine, ascorbic acid, aspirin EC, buPROPion, cholestyramine, cyclobenzaprine, diphenoxylate-atropine, divalproex, donepezil, fenofibrate, gabapentin,  lidocaine, linagliptin, lisinopril-hydrochlorothiazide, memantine,  metFORMIN, metoprolol succinate, omega-3 acid ethyl esters, omeprazole, oxyCODONE-acetaminophen, pantoprazole, pramipexole, primidone, rosuvastatin, tamsulosin, and traZODone  History Review  Allergy: James Hood has No Known Allergies. Drug: James Hood  reports no history of drug use. Alcohol:  reports that he does not currently use alcohol. Tobacco:  reports that he has been smoking cigarettes. He started smoking about 67 years ago. He has a 33.9 pack-year smoking history. He has never used smokeless tobacco. Social: James Hood  reports that he has been smoking cigarettes. He started smoking about 67 years ago. He has a 33.9 pack-year smoking history. He has never used smokeless tobacco. He reports that he does not currently use alcohol. He reports that he does not use drugs. 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 Family: family history includes Depression in his father; Heart attack in his mother; Heart disease in his mother; Hypertension in his father.  Laboratory Chemistry Profile   Renal Lab Results  Component Value Date   BUN 43 (H) 01/16/2023   CREATININE 1.37 01/16/2023   BCR 23 10/08/2022   GFR 47.89 (L) 01/16/2023   GFRAA >60 09/25/2019   GFRNONAA 55 (L) 06/08/2020    Hepatic Lab Results  Component Value Date   AST 32 12/04/2022   ALT 23 12/04/2022   ALBUMIN 4.0 12/04/2022   ALKPHOS 55 12/04/2022   AMMONIA 14 08/09/2016    Electrolytes Lab Results  Component Value Date   NA 134 (L) 01/16/2023   K 4.3 01/16/2023   CL 98 01/16/2023   CALCIUM 9.6 01/16/2023   MG 1.4 (L) 09/08/2020    Bone Lab Results  Component Value Date   VD25OH 37.09 11/10/2021    Inflammation (CRP: Acute Phase) (ESR: Chronic Phase) Lab Results  Component Value Date   ESRSEDRATE 15 07/20/2019   LATICACIDVEN 1.9 06/08/2020         Note: Above Lab results reviewed.  Recent Imaging Review  DG PAIN CLINIC C-ARM 1-60 MIN NO REPORT Fluoro was used, but no Radiologist interpretation will be provided.  Please refer to "NOTES" tab for provider progress note.  EXAM: Magnetic resonance imaging, spinal canal and contents, lumbar, without contrast material.  DATE: 12/26/2022 1:17 PM  ACCESSION: [id number]  DICTATED: 12/26/2022 1:34 PM  INTERPRETATION LOCATION: [facility]   CLINICAL INDICATION: 82 years old Male with low back pain, radiating leg pain  - M54.16 - Left lumbar radiculopathy    COMPARISON: None   TECHNIQUE: Multiplanar MRI was performed through the lumbar spine without intravenous contrast.   FINDINGS:   Multilevel degenerative endplate changes, most pronounced at L5-S1.  Schmorl nodes at inferior endplate of L1,  L2-L3, and superior endplate of L4.   Heterogenous bone marrow signal intensity, likely degenerative.   The visualized cord is unremarkable.  The conus medullaris ends at a normal level.   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  Straightening of normal lumbar lordosis.  Trace anterolisthesis at L4-L5.  Trace left lateral listhesis at L4-L5.  Vertebral alignment is otherwise unremarkable.   Multilevel disc degeneration and height loss.   T12-L1: Mild diffuse disc bulge and mild ligamentum flavum thickening. No significant spinal canal or neural foraminal narrowing.   L1-L2: Diffuse disc bulge with ligamentum flavum thickening and facet hypertrophy. No significant central spinal canal stenosis. Mild right neural foraminal narrowing.   L2-L3: Diffuse disc bulge with ligamentum flavum thickening and right predominant facet hypertrophy. No significant central spinal canal stenosis. Mild right and minimal left neural foraminal stenosis.   L3-L4: Diffuse disc bulge with central annular fissuring and right foraminal protrusion, ligamentum flavum thickening and right facet arthrosis contributes to mild spinal canal stenosis, partial effacement of the right lateral recess, mild left and severe right neural foraminal narrowing. The disc contacts the bilateral traversing nerve roots [4:131].   L4-L5: Asymmetric disc bulge with ligamentum flavum thickening and facet arthropathy, contributes to mild spinal canal stenosis, moderate right and moderate to severe left neural foraminal narrowing.   L5-S1: Asymmetric disc bulge with ligamentum flavum thickening and facet arthropathy contributes to mild spinal canal stenosis, severe left and mild-to-moderate right neural foraminal narrowing.   Mild fatty atrophy of posterior paraspinal muscles.  Nonspecific perinephric fat stranding bilaterally.  Paravertebral soft tissues are otherwise unremarkable.   For the purposes of this dictation, the lowest well formed intervertebral disc  space is assumed to be the L5-S1 level, and there are presumed to be five lumbar-type vertebral bodies.   Note: Reviewed        Physical Exam  General appearance: Well nourished, well developed, and well hydrated. In no apparent acute distress Mental status: Alert, oriented x 3 (person, place, & time)       Respiratory: No evidence of acute respiratory distress Eyes: PERLA Vitals: BP 110/66   Pulse 83   Temp 97.6 F (36.4 C) (Temporal)   Ht 5\' 10"  (1.778 m)   Wt 165 lb (74.8 kg)   SpO2 96%   BMI 23.68 kg/m  BMI: Estimated body mass index is 23.68 kg/m as calculated from the following:   Height as of this encounter: 5\' 10"  (1.778 m).   Weight as of this encounter: 165 lb (74.8 kg). Ideal: Ideal body weight: 73 kg (160 lb 15 oz) Adjusted ideal body weight: 73.7 kg (162 lb 9 oz)  Lumbar Spine Area Exam  Skin & Axial Inspection: No masses, redness, or swelling Alignment: Symmetrical Functional ROM: Pain restricted ROM affecting primarily the left Stability: No instability detected Muscle Tone/Strength: Functionally intact. No obvious neuro-muscular anomalies detected. Sensory (Neurological): Dermatomal pain pattern improving after treatment Palpation: No palpable anomalies       Provocative Tests:  Lumbar quadrant test (Kemp's test): (+) on the left for foraminal stenosis, improving after treatment Lateral bending test: (+) ipsilateral radicular pain, on the left. Positive for left-sided foraminal stenosis.  Improving after treatment  Gait & Posture Assessment  Ambulation: Limited Gait: Relatively normal for age and body habitus Posture: WNL  Lower Extremity Exam    Side: Right lower extremity  Side: Left lower extremity  Stability: No instability observed          Stability: No instability observed  Skin & Extremity Inspection: Skin color, temperature, and hair growth are WNL. No peripheral edema or cyanosis. No masses, redness, swelling, asymmetry, or associated  skin lesions. No contractures.  Skin & Extremity Inspection: Skin color, temperature, and hair growth are WNL. No peripheral edema or cyanosis. No masses, redness, swelling, asymmetry, or associated skin lesions. No contractures.  Functional ROM: Unrestricted ROM                  Functional ROM: Pain restricted ROM for hip and knee joints          Muscle Tone/Strength: Functionally intact. No obvious neuro-muscular anomalies detected.  Muscle Tone/Strength: Functionally intact. No obvious neuro-muscular anomalies detected.  Sensory (Neurological): Unimpaired        Sensory (Neurological): Unimpaired        DTR: Patellar: deferred today Achilles: deferred today Plantar: deferred today  DTR: Patellar: deferred today Achilles: deferred today Plantar: deferred today  Palpation: No palpable anomalies  Palpation: No palpable anomalies    Assessment   Diagnosis Status  1. 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 Lumbosacral radiculopathy   3. Sciatica of left side associated with disorder of lumbar spine   4. Chronic pain of left ankle   5. Primary osteoarthritis of left ankle   6. Chronic pain syndrome    Responding Responding Responding   Updated Problems: No problems updated.   Plan of Care  Overall, [name] had a positive response to his left L4 and L5 transforaminal epidural steroid injection.  He notes 90% pain relief for the first 2 weeks with gradual return of pain thereafter.  He states that he is starting to notice return of his pain overlying his left posterior lateral calf.  We discussed series therapy and trying left L4 and L5 transforaminal ESI #2 and adding additional volume.   1. 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 Lumbosacral radiculopathy - Lumbar Transforaminal Epidural; Future - oxyCODONE-acetaminophen (PERCOCET) 10-325 MG tablet; Take 1 tablet by mouth  every 6 (six) hours as needed for pain.  Dispense: 120 tablet; Refill: 0 - oxyCODONE-acetaminophen (PERCOCET) 10-325 MG tablet; Take 1 tablet by mouth every 6 (six) hours as needed for pain.  Dispense: 120 tablet; Refill: 0 - oxyCODONE-acetaminophen (PERCOCET) 10-325 MG tablet; Take 1 tablet by mouth every 6 (six) hours as needed for pain.  Dispense: 120 tablet; Refill: 0  3. Sciatica of left side associated with disorder of lumbar spine  4. Chronic pain of left ankle  5. Primary osteoarthritis of left ankle  6. Chronic pain syndrome - Lumbar Transforaminal Epidural; Future - oxyCODONE-acetaminophen (PERCOCET) 10-325 MG tablet; Take 1 tablet by mouth every 6 (six) hours as needed for pain.  Dispense: 120 tablet; Refill: 0 - oxyCODONE-acetaminophen (PERCOCET) 10-325 MG tablet; Take 1 tablet by mouth every 6 (six) hours as needed for pain.  Dispense: 120 tablet; Refill: 0 - oxyCODONE-acetaminophen (PERCOCET) 10-325 MG tablet; Take 1 tablet by mouth every 6 (six) hours as needed for pain.  Dispense: 120 tablet; Refill: 0   Orders:  Orders Placed This Encounter  Procedures   Lumbar Transforaminal Epidural    Standing Status:   Future    Standing Expiration Date:   06/07/2023    Scheduling Instructions:     Laterality: LEFT L4 and L5          Sedation: PO Valium     Timeframe:  ASAP    Order Specific Question:   Where will this procedure be performed?    Answer:   [facility]   Follow-up plan:   Return in about 15 weeks (around 06/20/2023) for MM, F2F.     Recent Visits Date Type Provider Dept  02/20/23 Procedure visit Edward Jolly, MD [facility]  01/31/23 Office Visit Edward Jolly, MD [facility]  12/20/22 Office Visit Edward Jolly, MD [facility]  Showing recent visits within past 90 days and meeting all other requirements Today's Visits Date Type Provider Dept  03/07/23 Office Visit Edward Jolly, MD [facility]  Showing  today's visits and meeting all other requirements Future Appointments Date Type Provider Dept  03/27/23 Appointment Edward Jolly, MD [facility]  03/27/23 Appointment Edward Jolly, MD [facility]  Showing future appointments within next 90 days and meeting all other requirements  I discussed the assessment and treatment plan with the patient. The patient was provided an opportunity to ask questions and all were answered. The patient agreed with the plan and demonstrated an understanding of the instructions.  Patient advised to call back or seek an in-person evaluation if the symptoms or condition worsens.  Duration of encounter: .  Total time on encounter, as per AMA guidelines included both the face-to-face and non-face-to-face time personally spent by the physician and/or other qualified health care professional(s) on the day of the encounter (includes time in activities that require the physician or other qualified health care professional and does not include time in activities normally performed by clinical staff). Physician's time may include the following activities when performed: Preparing to see the patient (e.g., pre-charting review of records, searching for previously ordered imaging, lab work, and nerve conduction tests) Review of prior analgesic pharmacotherapies. Reviewing PMP Interpreting ordered tests (e.g., lab work, imaging, nerve conduction tests) Performing post-procedure evaluations, including interpretation of diagnostic procedures Obtaining and/or reviewing separately obtained history Performing a medically appropriate examination and/or evaluation Counseling and educating the patient/family/caregiver Ordering medications, tests, or procedures Referring and communicating with other health care professionals (when not separately reported) Documenting clinical information in the electronic or other health record Independently interpreting  results (not separately reported) and communicating results to the patient/ family/caregiver Care coordination (not separately reported)  Note by: Edward Jolly, MD Date: 03/07/2023; Time: 11:10 AM

## 2023-03-07 NOTE — Patient Instructions (Signed)
GENERAL RISKS AND COMPLICATIONS ° °What are the risk, side effects and possible complications? °Generally speaking, most procedures are safe.  However, with any procedure there are risks, side effects, and the possibility of complications.  The risks and complications are dependent upon the sites that are lesioned, or the type of nerve block to be performed.  The closer the procedure is to the spine, the more serious the risks are.  Great care is taken when placing the radio frequency needles, block needles or lesioning probes, but sometimes complications can occur. °Infection: Any time there is an injection through the skin, there is a risk of infection.  This is why sterile conditions are used for these blocks.  There are four possible types of infection. °Localized skin infection. °Central Nervous System Infection-This can be in the form of Meningitis, which can be deadly. °Epidural Infections-This can be in the form of an epidural abscess, which can cause pressure inside of the spine, causing compression of the spinal cord with subsequent paralysis. This would require an emergency surgery to decompress, and there are no guarantees that the patient would recover from the paralysis. °Discitis-This is an infection of the intervertebral discs.  It occurs in about 1% of discography procedures.  It is difficult to treat and it may lead to surgery. ° °      2. Pain: the needles have to go through skin and soft tissues, will cause soreness. °      3. Damage to internal structures:  The nerves to be lesioned may be near blood vessels or   ° other nerves which can be potentially damaged. °      4. Bleeding: Bleeding is more common if the patient is taking blood thinners such as  aspirin, Coumadin, Ticiid, Plavix, etc., or if he/she have some genetic predisposition  such as hemophilia. Bleeding into the spinal canal can cause compression of the spinal  cord with subsequent paralysis.  This would require an emergency  surgery to  decompress and there are no guarantees that the patient would recover from the  paralysis. °      5. Pneumothorax:  Puncturing of a lung is a possibility, every time a needle is introduced in  the area of the chest or upper back.  Pneumothorax refers to free air around the  collapsed lung(s), inside of the thoracic cavity (chest cavity).  Another two possible  complications related to a similar event would include: Hemothorax and Chylothorax.   These are variations of the Pneumothorax, where instead of air around the collapsed  lung(s), you may have blood or chyle, respectively. °      6. Spinal headaches: They may occur with any procedures in the area of the spine. °      7. Persistent CSF (Cerebro-Spinal Fluid) leakage: This is a rare problem, but may occur  with prolonged intrathecal or epidural catheters either due to the formation of a fistulous  track or a dural tear. °      8. Nerve damage: By working so close to the spinal cord, there is always a possibility of  nerve damage, which could be as serious as a permanent spinal cord injury with  paralysis. °      9. Death:  Although rare, severe deadly allergic reactions known as "Anaphylactic  reaction" can occur to any of the medications used. °     10. Worsening of the symptoms:  We can always make thing worse. ° °What are the chances   of something like this happening? °Chances of any of this occuring are extremely low.  By statistics, you have more of a chance of getting killed in a motor vehicle accident: while driving to the hospital than any of the above occurring .  Nevertheless, you should be aware that they are possibilities.  In general, it is similar to taking a shower.  Everybody knows that you can slip, hit your head and get killed.  Does that mean that you should not shower again?  Nevertheless always keep in mind that statistics do not mean anything if you happen to be on the wrong side of them.  Even if a procedure has a 1 (one) in a  1,000,000 (million) chance of going wrong, it you happen to be that one..Also, keep in mind that by statistics, you have more of a chance of having something go wrong when taking medications. ° °Who should not have this procedure? °If you are on a blood thinning medication (e.g. Coumadin, Plavix, see list of "Blood Thinners"), or if you have an active infection going on, you should not have the procedure.  If you are taking any blood thinners, please inform your physician. ° °How should I prepare for this procedure? °Do not eat or drink anything at least six hours prior to the procedure. °Bring a driver with you .  It cannot be a taxi. °Come accompanied by an adult that can drive you back, and that is strong enough to help you if your legs get weak or numb from the local anesthetic. °Take all of your medicines the morning of the procedure with just enough water to swallow them. °If you have diabetes, make sure that you are scheduled to have your procedure done first thing in the morning, whenever possible. °If you have diabetes, take only half of your insulin dose and notify our nurse that you have done so as soon as you arrive at the clinic. °If you are diabetic, but only take blood sugar pills (oral hypoglycemic), then do not take them on the morning of your procedure.  You may take them after you have had the procedure. °Do not take aspirin or any aspirin-containing medications, at least eleven (11) days prior to the procedure.  They may prolong bleeding. °Wear loose fitting clothing that may be easy to take off and that you would not mind if it got stained with Betadine or blood. °Do not wear any jewelry or perfume °Remove any nail coloring.  It will interfere with some of our monitoring equipment. ° °NOTE: Remember that this is not meant to be interpreted as a complete list of all possible complications.  Unforeseen problems may occur. ° °BLOOD THINNERS °The following drugs contain aspirin or other products,  which can cause increased bleeding during surgery and should not be taken for 2 weeks prior to and 1 week after surgery.  If you should need take something for relief of minor pain, you may take acetaminophen which is found in Tylenol,m Datril, Anacin-3 and Panadol. It is not blood thinner. The products listed below are.  Do not take any of the products listed below in addition to any listed on your instruction sheet. ° °A.P.C or A.P.C with Codeine Codeine Phosphate Capsules #3 Ibuprofen Ridaura  °ABC compound Congesprin Imuran rimadil  °Advil Cope Indocin Robaxisal  °Alka-Seltzer Effervescent Pain Reliever and Antacid Coricidin or Coricidin-D ° Indomethacin Rufen  °Alka-Seltzer plus Cold Medicine Cosprin Ketoprofen S-A-C Tablets  °Anacin Analgesic Tablets or Capsules Coumadin   Korlgesic Salflex  °Anacin Extra Strength Analgesic tablets or capsules CP-2 Tablets Lanoril Salicylate  °Anaprox Cuprimine Capsules Levenox Salocol  °Anexsia-D Dalteparin Magan Salsalate  °Anodynos Darvon compound Magnesium Salicylate Sine-off  °Ansaid Dasin Capsules Magsal Sodium Salicylate  °Anturane Depen Capsules Marnal Soma  °APF Arthritis pain formula Dewitt's Pills Measurin Stanback  °Argesic Dia-Gesic Meclofenamic Sulfinpyrazone  °Arthritis Bayer Timed Release Aspirin Diclofenac Meclomen Sulindac  °Arthritis pain formula Anacin Dicumarol Medipren Supac  °Analgesic (Safety coated) Arthralgen Diffunasal Mefanamic Suprofen  °Arthritis Strength Bufferin Dihydrocodeine Mepro Compound Suprol  °Arthropan liquid Dopirydamole Methcarbomol with Aspirin Synalgos  °ASA tablets/Enseals Disalcid Micrainin Tagament  °Ascriptin Doan's Midol Talwin  °Ascriptin A/D Dolene Mobidin Tanderil  °Ascriptin Extra Strength Dolobid Moblgesic Ticlid  °Ascriptin with Codeine Doloprin or Doloprin with Codeine Momentum Tolectin  °Asperbuf Duoprin Mono-gesic Trendar  °Aspergum Duradyne Motrin or Motrin IB Triminicin  °Aspirin plain, buffered or enteric coated  Durasal Myochrisine Trigesic  °Aspirin Suppositories Easprin Nalfon Trillsate  °Aspirin with Codeine Ecotrin Regular or Extra Strength Naprosyn Uracel  °Atromid-S Efficin Naproxen Ursinus  °Auranofin Capsules Elmiron Neocylate Vanquish  °Axotal Emagrin Norgesic Verin  °Azathioprine Empirin or Empirin with Codeine Normiflo Vitamin E  °Azolid Emprazil Nuprin Voltaren  °Bayer Aspirin plain, buffered or children's or timed BC Tablets or powders Encaprin Orgaran Warfarin Sodium  °Buff-a-Comp Enoxaparin Orudis Zorpin  °Buff-a-Comp with Codeine Equegesic Os-Cal-Gesic   °Buffaprin Excedrin plain, buffered or Extra Strength Oxalid   °Bufferin Arthritis Strength Feldene Oxphenbutazone   °Bufferin plain or Extra Strength Feldene Capsules Oxycodone with Aspirin   °Bufferin with Codeine Fenoprofen Fenoprofen Pabalate or Pabalate-SF   °Buffets II Flogesic Panagesic   °Buffinol plain or Extra Strength Florinal or Florinal with Codeine Panwarfarin   °Buf-Tabs Flurbiprofen Penicillamine   °Butalbital Compound Four-way cold tablets Penicillin   °Butazolidin Fragmin Pepto-Bismol   °Carbenicillin Geminisyn Percodan   °Carna Arthritis Reliever Geopen Persantine   °Carprofen Gold's salt Persistin   °Chloramphenicol Goody's Phenylbutazone   °Chloromycetin Haltrain Piroxlcam   °Clmetidine heparin Plaquenil   °Cllnoril Hyco-pap Ponstel   °Clofibrate Hydroxy chloroquine Propoxyphen   °     ° °Before stopping any of these medications, be sure to consult the physician who ordered them.  Some, such as Coumadin (Warfarin) are ordered to prevent or treat serious conditions such as "deep thrombosis", "pumonary embolisms", and other heart problems.  The amount of time that you may need off of the medication may also vary with the medication and the reason for which you were taking it.  If you are taking any of these medications, please make sure you notify your pain physician before you undergo any procedures. ° ° ° ° ° ° ° ° °Epidural Steroid  Injection °Patient Information ° °Description: The epidural space surrounds the nerves as they exit the spinal cord.  In some patients, the nerves can be compressed and inflamed by a bulging disc or a tight spinal canal (spinal stenosis).  By injecting steroids into the epidural space, we can bring irritated nerves into direct contact with a potentially helpful medication.  These steroids act directly on the irritated nerves and can reduce swelling and inflammation which often leads to decreased pain.  Epidural steroids may be injected anywhere along the spine and from the neck to the low back depending upon the location of your pain. °  After numbing the skin with local anesthetic (like Novocaine), a small needle is passed into the epidural space slowly.  You may experience a sensation of pressure while this   is being done.  The entire block usually last less than 10 minutes. ° °Conditions which may be treated by epidural steroids: ° °Low back and leg pain °Neck and arm pain °Spinal stenosis °Post-laminectomy syndrome °Herpes zoster (shingles) pain °Pain from compression fractures ° °Preparation for the injection: ° °Do not eat any solid food or dairy products within 8 hours of your appointment.  °You may drink clear liquids up to 3 hours before appointment.  Clear liquids include water, black coffee, juice or soda.  No milk or cream please. °You may take your regular medication, including pain medications, with a sip of water before your appointment  Diabetics should hold regular insulin (if taken separately) and take 1/2 normal NPH dos the morning of the procedure.  Carry some sugar containing items with you to your appointment. °A driver must accompany you and be prepared to drive you home after your procedure.  °Bring all your current medications with your. °An IV may be inserted and sedation may be given at the discretion of the physician.   °A blood pressure cuff, EKG and other monitors will often be applied  during the procedure.  Some patients may need to have extra oxygen administered for a short period. °You will be asked to provide medical information, including your allergies, prior to the procedure.  We must know immediately if you are taking blood thinners (like Coumadin/Warfarin)  Or if you are allergic to IV iodine contrast (dye). We must know if you could possible be pregnant. ° °Possible side-effects: °Bleeding from needle site °Infection (rare, may require surgery) °Nerve injury (rare) °Numbness & tingling (temporary) °Difficulty urinating (rare, temporary) °Spinal headache ( a headache worse with upright posture) °Light -headedness (temporary) °Pain at injection site (several days) °Decreased blood pressure (temporary) °Weakness in arm/leg (temporary) °Pressure sensation in back/neck (temporary) ° °Call if you experience: °Fever/chills associated with headache or increased back/neck pain. °Headache worsened by an upright position. °New onset weakness or numbness of an extremity below the injection site °Hives or difficulty breathing (go to the emergency room) °Inflammation or drainage at the infection site °Severe back/neck pain °Any new symptoms which are concerning to you ° °Please note: ° °Although the local anesthetic injected can often make your back or neck feel good for several hours after the injection, the pain will likely return.  It takes 3-7 days for steroids to work in the epidural space.  You may not notice any pain relief for at least that one week. ° °If effective, we will often do a series of three injections spaced 3-6 weeks apart to maximally decrease your pain.  After the initial series, we generally will wait several months before considering a repeat injection of the same type. ° °If you have any questions, please call [phone] °[facility] °

## 2023-03-07 NOTE — Progress Notes (Signed)
Safety precautions to be maintained throughout the outpatient stay will include: orient to surroundings, keep bed in low position, maintain call bell within reach at all times, provide assistance with transfer out of bed and ambulation.   Nursing Pain Medication Assessment:  Safety precautions to be maintained throughout the outpatient stay will include: orient to surroundings, keep bed in low position, maintain call bell within reach at all times, provide assistance with transfer out of bed and ambulation.  Medication Inspection Compliance: Pill count conducted under aseptic conditions, in front of the patient. Neither the pills nor the bottle was removed from the patient's sight at any time. Once count was completed pills were immediately returned to the patient in their original bottle.  Medication: Oxycodone/APAP Pill/Patch Count:  92 of 120 pills remain Pill/Patch Appearance: Markings consistent with prescribed medication Bottle Appearance: Standard pharmacy container. Clearly labeled. Filled Date: 54 / 8 / 2024 Last Medication intake:  Today

## 2023-03-12 ENCOUNTER — Ambulatory Visit: Payer: PPO | Admitting: Anesthesiology

## 2023-03-12 ENCOUNTER — Other Ambulatory Visit: Payer: Self-pay

## 2023-03-12 ENCOUNTER — Encounter
Admission: RE | Disposition: A | Discharge status: Home / Self Care | Payer: Self-pay | Source: Home / Self Care | Attending: Ophthalmology

## 2023-03-12 ENCOUNTER — Ambulatory Visit
Admission: RE | Admit: 2023-03-12 | Discharge: 2023-03-12 | Disposition: A | Discharge status: Home / Self Care | Payer: PPO | Attending: Ophthalmology | Admitting: Ophthalmology

## 2023-03-12 DIAGNOSIS — F17210 Nicotine dependence, cigarettes, uncomplicated: Secondary | ICD-10-CM | POA: Diagnosis not present

## 2023-03-12 DIAGNOSIS — K219 Gastro-esophageal reflux disease without esophagitis: Secondary | ICD-10-CM | POA: Insufficient documentation

## 2023-03-12 DIAGNOSIS — I10 Essential (primary) hypertension: Secondary | ICD-10-CM | POA: Insufficient documentation

## 2023-03-12 DIAGNOSIS — I499 Cardiac arrhythmia, unspecified: Secondary | ICD-10-CM | POA: Diagnosis not present

## 2023-03-12 DIAGNOSIS — H2511 Age-related nuclear cataract, right eye: Secondary | ICD-10-CM | POA: Insufficient documentation

## 2023-03-12 DIAGNOSIS — F0390 Unspecified dementia without behavioral disturbance: Secondary | ICD-10-CM | POA: Diagnosis not present

## 2023-03-12 DIAGNOSIS — Z7984 Long term (current) use of oral hypoglycemic drugs: Secondary | ICD-10-CM | POA: Insufficient documentation

## 2023-03-12 DIAGNOSIS — E1136 Type 2 diabetes mellitus with diabetic cataract: Secondary | ICD-10-CM | POA: Diagnosis not present

## 2023-03-12 DIAGNOSIS — Z96651 Presence of right artificial knee joint: Secondary | ICD-10-CM | POA: Insufficient documentation

## 2023-03-12 DIAGNOSIS — Z8711 Personal history of peptic ulcer disease: Secondary | ICD-10-CM | POA: Insufficient documentation

## 2023-03-12 HISTORY — DX: Presence of dental prosthetic device (complete) (partial): Z97.2

## 2023-03-12 HISTORY — PX: CATARACT EXTRACTION W/PHACO: SHX586

## 2023-03-12 LAB — GLUCOSE, CAPILLARY: Glucose-Capillary: 117 mg/dL — ABNORMAL HIGH (ref 70–99)

## 2023-03-12 SURGERY — PHACOEMULSIFICATION, CATARACT, WITH IOL INSERTION
Anesthesia: Monitor Anesthesia Care | Site: Eye | Laterality: Right

## 2023-03-12 MED ORDER — SIGHTPATH DOSE#1 BSS IO SOLN
INTRAOCULAR | Status: DC | PRN
  Administered 2023-03-12: 15 mL via INTRAOCULAR

## 2023-03-12 MED ORDER — ARMC OPHTHALMIC DILATING DROPS
OPHTHALMIC | Status: AC
  Filled 2023-03-12: qty 0.5

## 2023-03-12 MED ORDER — FENTANYL CITRATE (PF) 100 MCG/2ML IJ SOLN
INTRAMUSCULAR | Status: DC | PRN
  Administered 2023-03-12: 50 ug via INTRAVENOUS

## 2023-03-12 MED ORDER — MOXIFLOXACIN HCL 0.5 % OP SOLN
OPHTHALMIC | Status: DC | PRN
  Administered 2023-03-12: .2 mL via OPHTHALMIC

## 2023-03-12 MED ORDER — SIGHTPATH DOSE#1 BSS IO SOLN
INTRAOCULAR | Status: DC | PRN
  Administered 2023-03-12: 2 mL

## 2023-03-12 MED ORDER — SODIUM CHLORIDE 0.9% FLUSH
10.0000 mL | Freq: Two times a day (BID) | INTRAVENOUS | Status: DC
Start: 2023-03-12 — End: 2023-03-12

## 2023-03-12 MED ORDER — TETRACAINE HCL 0.5 % OP SOLN
OPHTHALMIC | Status: AC
  Filled 2023-03-12: qty 4

## 2023-03-12 MED ORDER — MIDAZOLAM HCL 2 MG/2ML IJ SOLN
INTRAMUSCULAR | Status: DC | PRN
  Administered 2023-03-12: 1 mg via INTRAVENOUS

## 2023-03-12 MED ORDER — BRIMONIDINE TARTRATE-TIMOLOL 0.2-0.5 % OP SOLN
OPHTHALMIC | Status: DC | PRN
  Administered 2023-03-12: 1 [drp] via OPHTHALMIC

## 2023-03-12 MED ORDER — MIDAZOLAM HCL 2 MG/2ML IJ SOLN
INTRAMUSCULAR | Status: AC
  Filled 2023-03-12: qty 2

## 2023-03-12 MED ORDER — LACTATED RINGERS IV SOLN: INTRAVENOUS | Status: DC

## 2023-03-12 MED ORDER — TETRACAINE HCL 0.5 % OP SOLN
1.0000 [drp] | OPHTHALMIC | Status: DC | PRN
  Administered 2023-03-12 (×3): 1 [drp] via OPHTHALMIC

## 2023-03-12 MED ORDER — FENTANYL CITRATE (PF) 100 MCG/2ML IJ SOLN
INTRAMUSCULAR | Status: AC
  Filled 2023-03-12: qty 2

## 2023-03-12 MED ORDER — SIGHTPATH DOSE#1 NA CHONDROIT SULF-NA HYALURON 40-17 MG/ML IO SOLN
INTRAOCULAR | Status: DC | PRN
  Administered 2023-03-12: 1 mL via INTRAOCULAR

## 2023-03-12 MED ORDER — ARMC OPHTHALMIC DILATING DROPS
1.0000 | OPHTHALMIC | Status: DC | PRN
  Administered 2023-03-12 (×3): 1 via OPHTHALMIC

## 2023-03-12 MED ORDER — SIGHTPATH DOSE#1 BSS IO SOLN
INTRAOCULAR | Status: DC | PRN
  Administered 2023-03-12: 88 mL via OPHTHALMIC

## 2023-03-12 SURGICAL SUPPLY — 12 items
ANGLE REVERSE CUT SHRT 25GA (CUTTER) ×1
CANNULA ANT/CHMB 27G (MISCELLANEOUS) IMPLANT
CANNULA ANT/CHMB 27GA (MISCELLANEOUS)
CATARACT SUITE SIGHTPATH (MISCELLANEOUS) ×1
CYSTOTOME ANGL RVRS SHRT 25G (CUTTER) ×1 IMPLANT
FEE CATARACT SUITE SIGHTPATH (MISCELLANEOUS) ×1 IMPLANT
GLOVE BIOGEL PI IND STRL 8 (GLOVE) ×1 IMPLANT
GLOVE SURG LX STRL 8.0 MICRO (GLOVE) ×1 IMPLANT
LENS IOL TECNIS EYHANCE 17.0 (Intraocular Lens) IMPLANT
NDL FILTER BLUNT 18X1 1/2 (NEEDLE) ×1 IMPLANT
NEEDLE FILTER BLUNT 18X1 1/2 (NEEDLE) ×1
SYR 3ML LL SCALE MARK (SYRINGE) ×1 IMPLANT

## 2023-03-12 NOTE — H&P (Signed)
[facility]   Primary Care Physician:  Glori Luis, MD Ophthalmologist: Dr.Adley Castello  Pre-Procedure History & Physical: HPI:  MONTY CALIENDO is a 82 y.o. male here for cataract surgery.   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   Colon polyps    Dementia (HCC)    Depression    Diabetes mellitus type 2 in nonobese (HCC)    GERD (gastroesophageal reflux disease)    BARRETTS ESOPHAGUS RESOLVED PER PATIENT   Headache    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HCC)    History of hiatal hernia    Hypercholesteremia    Hypertension    Hyperthyroidism    Neuropathic pain    Primary localized osteoarthritis of right knee    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   Stomach ulcer    Tremor, essential    Wears dentures    full upper   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/02/2018    Past Surgical History:  Procedure Laterality Date   ANKLE ARTHROSCOPY Left 09/29/2019   Procedure: LEFT ANKLE ARTHROSCOPY, DEBRIDEMENT;  Surgeon: Nadara Mustard, MD;  Location: [facility];  Service: Orthopedics;  Laterality: Left;   BACK SURGERY     BIOPSY  01/18/2023   Procedure: BIOPSY;  Surgeon: [name], DO;  Location: [facility] ENDOSCOPY;  Service: Gastroenterology;;   COLONOSCOPY WITH PROPOFOL N/A 09/14/2016   Procedure: COLONOSCOPY WITH PROPOFOL;  Surgeon: Scot Jun, MD;  Location: [facility] ENDOSCOPY;  Service: Endoscopy;  Laterality: N/A;   esophageal stretch     ESOPHAGOGASTRODUODENOSCOPY (EGD) WITH PROPOFOL N/A 12/13/2020   Procedure: ESOPHAGOGASTRODUODENOSCOPY (EGD) WITH PROPOFOL;  Surgeon: Regis Bill, MD;  Location: [facility] ENDOSCOPY;  Service: Endoscopy;  Laterality: N/A;  IDDM   ESOPHAGOGASTRODUODENOSCOPY (EGD) WITH PROPOFOL N/A 01/18/2023   Procedure: ESOPHAGOGASTRODUODENOSCOPY (EGD) WITH  PROPOFOL;  Surgeon: [name], DO;  Location: [facility] ENDOSCOPY;  Service: Gastroenterology;  Laterality: N/A;   JOINT REPLACEMENT Right 2016   knee   LUMBAR LAMINECTOMY/DECOMPRESSION MICRODISCECTOMY Left 02/03/2016   Procedure: LEFT L5-S1 DISKECTOMY;  Surgeon: Hilda Lias, MD;  Location: [facility] NEURO ORS;  Service: Neurosurgery;  Laterality: Left;  LEFT L5-S1 DISKECTOMY   MALONEY DILATION  01/18/2023   Procedure: MALONEY DILATION;  Surgeon: [name], DO;  Location: [facility] ENDOSCOPY;  Service: Gastroenterology;;   PULSE GENERATOR IMPLANT N/A 08/21/2017   Procedure: UNILATERAL PULSE GENERATOR IMPLANT;  Surgeon: Venetia Night, MD;  Location: [facility] ORS;  Service: Neurosurgery;  Laterality: N/A;   PULSE GENERATOR IMPLANT Right 04/02/2018   Procedure: REMOVAL OF PULSE GENERATOR IMPLANT AND LEADS;  Surgeon: Venetia Night, MD;  Location: [facility] ORS;  Service: Neurosurgery;  Laterality: Right;   TONSILLECTOMY     TOTAL KNEE ARTHROPLASTY Right 11/15/2014   Procedure: TOTAL KNEE ARTHROPLASTY;  Surgeon: Salvatore Marvel, MD;  Location: [facility] OR;  Service: Orthopedics;  Laterality: Right;    Prior to Admission medications   Medication Sig Start Date End Date Taking? Authorizing Provider  aspirin EC 81 MG tablet Take 81 mg by mouth daily.   Yes [provider]  buPROPion (WELLBUTRIN XL) 300 MG 24 hr tablet Take 1 tablet (300 mg total) by mouth daily. 08/25/22 03/07/23 Yes Hisada, Barbee Cough, MD  cholestyramine Lanetta Inch) 4 g packet Take 1 packet by mouth 2 (two) times daily. 09/24/21  Yes [provider]  Continuous Blood Gluc Sensor (FREESTYLE LIBRE 14 DAY SENSOR) MISC APPLY 1 SENSOR EVERY 14 DAYS TO MONITOR BLOOD GLUCOSE LEVELS. REMOVE OLD SENSOR BEFORE APPLYING NEW SENSOR 02/27/22  Yes Glori Luis, MD  cyclobenzaprine (FLEXERIL) 10 MG tablet Take 1 tablet (10 mg total) by mouth 3 (three) times daily as needed for muscle spasms. 02/27/23  Yes Glori Luis, MD   diphenoxylate-atropine (LOMOTIL) 2.5-0.025 MG tablet Take by mouth. 10/31/21  Yes [provider]  divalproex (DEPAKOTE ER) 500 MG 24 hr tablet Take 1 tablet by mouth 2 (two) times daily. 03/20/18  Yes [provider]  donepezil (ARICEPT) 10 MG tablet Take 10 mg by mouth at bedtime.   Yes [provider]  DULoxetine (CYMBALTA) 30 MG capsule Take 2 capsules (60 mg total) by mouth daily. 12/11/22  Yes Glori Luis, MD  fenofibrate (TRICOR) 145 MG tablet TAKE 1 TABLET BY MOUTH DAILY 12/14/22  Yes Glori Luis, MD  gabapentin (NEURONTIN) 600 MG tablet TAKE TWO TABLETS BY MOUTH THREE TIMES A DAY 11/02/19  Yes Glori Luis, MD  lidocaine (LIDODERM) 5 % Place 1 patch onto the skin every 12 (twelve) hours. Remove & Discard patch within 12 hours or as directed by MD 08/16/22  Yes Jerrol Banana, MD  linagliptin (TRADJENTA) 5 MG TABS tablet Take 1 tablet (5 mg total) by mouth daily. 12/06/22  Yes Glori Luis, MD  lisinopril-hydrochlorothiazide (ZESTORETIC) 20-25 MG tablet TAKE 1 TABLET BY MOUTH 2 TIMES A DAY 02/22/23  Yes Glori Luis, MD  memantine (NAMENDA) 10 MG tablet Take 1 tablet by mouth daily. 10/18/20  Yes [provider]  metFORMIN (GLUCOPHAGE-XR) 500 MG 24 hr tablet TAKE 1 TABLET BY MOUTH TWICE A DAY 01/09/23  Yes Glori Luis, MD  metoprolol succinate (TOPROL-XL) 25 MG 24 hr tablet Take 25 mg by mouth daily. 01/15/20  Yes [provider]  Multiple Vitamins-Minerals (CENTRUM SILVER PO) Take 1 tablet by mouth daily.   Yes [provider]  omega-3 acid ethyl esters (LOVAZA) 1 g capsule TAKE 2 CAPSULES BY MOUTH TWICE A DAY 11/07/22  Yes Glori Luis, MD  omeprazole (PRILOSEC) 20 MG capsule Take 20 mg by mouth daily. 04/01/16  Yes [provider]  pantoprazole (PROTONIX) 40 MG tablet Take 40 mg by mouth daily. 07/14/22  Yes [provider]  pramipexole (MIRAPEX) 0.125 MG tablet TAKE 1 TABLET BY  MOUTH EVERY NIGHT 2-3 HOURS BEFORE BEDTIME 10/17/22  Yes Glori Luis, MD  primidone (MYSOLINE) 50 MG tablet Take 150-250 mg by mouth 2 (two) times daily. Take 250 mg by mouth in the morning & take 150 mg at night.   Yes [provider]  QUEtiapine (SEROQUEL) 25 MG tablet Take 75 mg by mouth at bedtime.   Yes [provider]  rosuvastatin (CRESTOR) 40 MG tablet TAKE ONE TABLET BY MOUTH DAILY 07/31/22  Yes Glori Luis, MD  tamsulosin (FLOMAX) 0.4 MG CAPS capsule TAKE 2 CAPSULES BY MOUTH DAILY AFTER SUPPER 07/23/22  Yes Glori Luis, MD  traZODone (DESYREL) 100 MG tablet Take 2 tablets (200 mg total) by mouth at bedtime as needed for sleep. 03/02/23 08/29/23 Yes Hisada, Barbee Cough, MD  vitamin C (ASCORBIC ACID) 500 MG tablet Take 1,000 mg by mouth daily.   Yes [provider]  Continuous Glucose Sensor (FREESTYLE LIBRE 14 DAY SENSOR) MISC APPLY SENSOR EVERY 14 DAYS TO MONITOR BLOOD GLUCOSE LEVELS. REMOVE OLD SENSOR BEFORE APPLYING  NEW SENSOR 11/07/22   Glori Luis, MD  oxyCODONE-acetaminophen (PERCOCET) 10-325 MG tablet Take 1 tablet by mouth every 6 (six) hours as needed for pain. 03/28/23 04/27/23  Edward Jolly, MD  oxyCODONE-acetaminophen (PERCOCET) 10-325 MG tablet Take 1 tablet by mouth every 6 (six) hours as needed for pain. 04/27/23 05/27/23  Edward Jolly, MD  oxyCODONE-acetaminophen (PERCOCET) 10-325 MG tablet Take 1 tablet by mouth every 6 (six) hours as needed for pain. 05/27/23 06/26/23  Edward Jolly, MD    Allergies as of 02/12/2023   (No Known Allergies)    Family History  Problem Relation Age of Onset   Heart attack Mother    Heart disease Mother    Hypertension Father    Depression Father    Kidney cancer Neg Hx    Prostate cancer Neg Hx     Social History   Socioeconomic History   Marital status: Married    Spouse name: Not on file   Number of children: Not on file   Years of education: Not on file   Highest education level: Master's  degree (e.g., MA, MS, MEng, MEd, MSW, MBA)  Occupational History   Not on file  Tobacco Use   Smoking status: Every Day    Current packs/day: 0.50    Average packs/day: 0.5 packs/day for 67.8 years (33.9 ttl pk-yrs)    Types: Cigarettes    Start date: 45   Smokeless tobacco: Never  Vaping Use   Vaping status: Former  Substance and Sexual Activity   Alcohol use: Not Currently    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   Drug use: Never   Sexual activity: Not Currently  Other Topics Concern   Not on file  Social History Narrative   Not on file   Social Determinants of Health   Financial Resource Strain: Low Risk  (12/31/2022)   Overall Financial Resource Strain (CARDIA)    Difficulty of Paying Living Expenses: Not hard at all  Food Insecurity: No Food Insecurity (12/31/2022)   Hunger Vital Sign    Worried About Running Out of Food in the Last Year: Never true    Ran Out of Food in the Last Year: Never true  Transportation Needs: No Transportation Needs (12/31/2022)   PRAPARE - Administrator, Civil Service (Medical): No    Lack of Transportation (Non-Medical): No  Physical Activity: Inactive (12/31/2022)   Exercise Vital Sign    Days of Exercise per Week: 0 days    Minutes of Exercise per Session: 0 min  Stress: No Stress Concern Present (12/31/2022)   [employer] of Occupational Health - Occupational Stress Questionnaire    Feeling of Stress : Only a little  Social Connections: Socially Integrated (12/31/2022)   Social Connection and Isolation Panel [NHANES]    Frequency of Communication with Friends and Family: More than three times a week    Frequency of Social Gatherings with Friends and Family: More than three times a week    Attends Religious Services: More than 4 times per year    Active Member of [employer] or Organizations: Yes    Attends Banker Meetings: More than 4 times per year    Marital Status: Married  Catering manager Violence:  Not At Risk (12/31/2022)   Humiliation, Afraid, Rape, and Kick questionnaire    Fear of Current or Ex-Partner: No    Emotionally Abused: No    Physically Abused: No    Sexually Abused: No  Review of Systems: See HPI, otherwise negative ROS  Physical Exam: BP 135/75   Pulse 61   Temp (!) 97.2 F (36.2 C) (Temporal)   Resp 18   Ht 5\' 10"  (1.778 m)   Wt 74.4 kg   SpO2 98%   BMI 23.53 kg/m  General:   Alert, cooperative in NAD Head:  Normocephalic and atraumatic. Respiratory:  Normal work of breathing. Cardiovascular:  RRR  Impression/Plan: [name] is here for cataract surgery.  Risks, benefits, limitations, and alternatives regarding cataract surgery have been reviewed with the patient.  Questions have been answered.  All parties agreeable.   Galen Manila, MD  03/12/2023, 8:16 AM

## 2023-03-12 NOTE — Anesthesia Preprocedure Evaluation (Signed)
Anesthesia Evaluation  Patient identified by MRN, date of birth, ID band Patient awake    Reviewed: Allergy & Precautions, NPO status , Patient's Chart, lab work & pertinent test results  History of Anesthesia Complications Negative for: history of anesthetic complications  Airway Mallampati: II  TM Distance: >3 FB Neck ROM: Full    Dental  (+) Poor Dentition, Missing, Dental Advidsory Given   Pulmonary neg shortness of breath, neg sleep apnea, neg COPD, neg recent URI, Current Smoker and Patient abstained from smoking.   breath sounds clear to auscultation- rhonchi (-) wheezing      Cardiovascular Exercise Tolerance: Good hypertension, Pt. on medications (-) angina (-) CAD, (-) Past MI, (-) Cardiac Stents and (-) CABG + dysrhythmias (-) Valvular Problems/Murmurs Rhythm:Regular Rate:Normal - Systolic murmurs and - Diastolic murmurs    Neuro/Psych  Headaches, neg Seizures PSYCHIATRIC DISORDERS Anxiety Depression   Dementia    GI/Hepatic Neg liver ROS, hiatal hernia, PUD,GERD  ,,  Endo/Other  diabetes, Oral Hypoglycemic Agents    Renal/GU      Musculoskeletal   Abdominal   Peds  Hematology  (+) Blood dyscrasia, anemia   Anesthesia Other Findings Past Medical History: No date: Anxiety No date: Anxiety and depression No date: Arthritis No date: BPH (benign prostatic hyperplasia) 07/2017: Chronic bilateral low back pain with left-sided sciatica No date: Colon polyps No date: Dementia (HCC) No date: Depression No date: Diabetes mellitus type 2 in nonobese (HCC) No date: GERD (gastroesophageal reflux disease)     Comment:  BARRETTS ESOPHAGUS RESOLVED PER PATIENT No date: Headache No date: 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HCC) No date: History of hiatal hernia No date: Hypercholesteremia No date: Hypertension No date: Hyperthyroidism No date: Neuropathic pain No date: Primary localized osteoarthritis of right  knee 08/26/2017: S/P insertion of spinal cord stimulator No date: Stomach ulcer No date: Tremor, essential 04/02/2018: Wound infection complicating hardware (HCC)   Reproductive/Obstetrics                             Anesthesia Physical Anesthesia Plan  ASA: 3  Anesthesia Plan: MAC   Post-op Pain Management: Minimal or no pain anticipated   Induction: Intravenous  PONV Risk Score and Plan:   Airway Management Planned: Natural Airway and Nasal Cannula  Additional Equipment:   Intra-op Plan:   Post-operative Plan:   Informed Consent: I have reviewed the patients History and Physical, chart, labs and discussed the procedure including the risks, benefits and alternatives for the proposed anesthesia with the patient or authorized representative who has indicated his/her understanding and acceptance.     Dental Advisory Given  Plan Discussed with: Anesthesiologist, CRNA and Surgeon  Anesthesia Plan Comments: (Patient consented for risks of anesthesia including but not limited to:  - adverse reactions to medications - damage to eyes, teeth, lips or other oral mucosa - nerve damage due to positioning  - sore throat or hoarseness - Damage to heart, brain, nerves, lungs, other parts of body or loss of life  Patient voiced understanding and assent.)       Anesthesia Quick Evaluation

## 2023-03-12 NOTE — Op Note (Signed)
PREOPERATIVE DIAGNOSIS:  Nuclear sclerotic cataract of the right eye.   POSTOPERATIVE DIAGNOSIS:  Cataract   OPERATIVE PROCEDURE:ORPROCALL@   SURGEON:  James Manila, MD.   ANESTHESIA:  Anesthesiologist: Stephanie Coup, MD CRNA: Emeterio Reeve, CRNA  1.      Managed anesthesia care. 2.      0.58ml of Shugarcaine was instilled in the eye following the paracentesis.   COMPLICATIONS:  None.   TECHNIQUE:   Stop and chop   DESCRIPTION OF PROCEDURE:  The patient was examined and consented in the preoperative holding area where the aforementioned topical anesthesia was applied to the right eye and then brought back to the Operating Room where the right eye was prepped and draped in the usual sterile ophthalmic fashion and a lid speculum was placed. A paracentesis was created with the side port blade and the anterior chamber was filled with viscoelastic. A near clear corneal incision was performed with the steel keratome. A continuous curvilinear capsulorrhexis was performed with a cystotome followed by the capsulorrhexis forceps. Hydrodissection and hydrodelineation were carried out with BSS on a blunt cannula. The lens was removed in a stop and chop  technique and the remaining cortical material was removed with the irrigation-aspiration handpiece. The capsular bag was inflated with viscoelastic and the Technis [id number]  lens was placed in the capsular bag without complication. The remaining viscoelastic was removed from the eye with the irrigation-aspiration handpiece. The wounds were hydrated. The anterior chamber was flushed with BSS and the eye was inflated to physiologic pressure. 0.28ml of Vigamox was placed in the anterior chamber. The wounds were found to be water tight. The eye was dressed with Combigan. The patient was given protective glasses to wear throughout the day and a shield with which to sleep tonight. The patient was also given drops with which to begin a drop regimen today and  will follow-up with me in one day. Implant Name Type Inv. Item Serial No. Manufacturer Lot No. LRB No. Used Action  LENS IOL TECNIS EYHANCE 17.0 - [id number] Intraocular Lens LENS IOL TECNIS EYHANCE 17.0 [id number] [employer]  Right 1 Implanted   Procedure(s): CATARACT EXTRACTION PHACO AND INTRAOCULAR LENS PLACEMENT (IOC) RIGHT DIABETIC 11.25 01:07.6 (Right)  Electronically signed: Galen Hood 03/12/2023 8:46 AM

## 2023-03-12 NOTE — Anesthesia Postprocedure Evaluation (Signed)
Anesthesia Post Note  Patient: James Hood  Procedure(s) Performed: CATARACT EXTRACTION PHACO AND INTRAOCULAR LENS PLACEMENT (IOC) RIGHT DIABETIC 11.25 01:07.6 (Right: Eye)  Patient location during evaluation: PACU Anesthesia Type: MAC Level of consciousness: awake and alert Pain management: pain level controlled Vital Signs Assessment: post-procedure vital signs reviewed and stable Respiratory status: spontaneous breathing, nonlabored ventilation, respiratory function stable and patient connected to nasal cannula oxygen Cardiovascular status: stable and blood pressure returned to baseline Postop Assessment: no apparent nausea or vomiting Anesthetic complications: yes Comments: During the end of the procedure, patient went into a Mobitz type 1 arrhythmia. Patient's vital signs were stable and patient stated he was asymptomatic. Discussed the case with the surgeon due to the most likely cause being the pressure on his eye. Patient finished the procedure within the next 30 seconds. While in PACU, got an EKG. Discussed the case with the patient and his wife. Recommended he follow up with his primary care physician. Wife stated she understood. Explained to the patient if he felt anything abnormal such as chest pain, shortness of breath, flutters in his chest, dizzyness, etc, he should go to the emergency department. Patient stated he understood and agreed.   No notable events documented.   Last Vitals:  Vitals:   03/12/23 0850 03/12/23 0856  BP: 109/61 104/65  Pulse: 64 67  Resp: 12 12  Temp:  (!) 36.4 C  SpO2: 93% 95%    Last Pain:  Vitals:   03/12/23 0856  TempSrc:   PainSc: 0-No pain                 Stephanie Coup

## 2023-03-12 NOTE — Transfer of Care (Signed)
Immediate Anesthesia Transfer of Care Note  Patient: James Hood  Procedure(s) Performed: CATARACT EXTRACTION PHACO AND INTRAOCULAR LENS PLACEMENT (IOC) RIGHT DIABETIC 11.25 01:07.6 (Right: Eye)  Patient Location: PACU  Anesthesia Type: MAC  Level of Consciousness: awake, alert  and patient cooperative  Airway and Oxygen Therapy: Patient Spontanous Breathing and Patient connected to supplemental oxygen  Post-op Assessment: Post-op Vital signs reviewed, Patient's Cardiovascular Status Stable, Respiratory Function Stable, Patent Airway and No signs of Nausea or vomiting  Post-op Vital Signs: Reviewed and stable  Complications: No notable events documented.

## 2023-03-13 ENCOUNTER — Encounter: Payer: Self-pay | Admitting: Ophthalmology

## 2023-03-13 ENCOUNTER — Ambulatory Visit: Payer: PPO

## 2023-03-13 DIAGNOSIS — H2512 Age-related nuclear cataract, left eye: Secondary | ICD-10-CM | POA: Diagnosis not present

## 2023-03-18 ENCOUNTER — Encounter: Payer: Self-pay | Admitting: Ophthalmology

## 2023-03-19 ENCOUNTER — Encounter: Payer: Self-pay | Admitting: Family Medicine

## 2023-03-19 ENCOUNTER — Ambulatory Visit (INDEPENDENT_AMBULATORY_CARE_PROVIDER_SITE_OTHER): Payer: PPO | Admitting: Family Medicine

## 2023-03-19 VITALS — BP 132/82 | HR 95 | Temp 97.5°F | Ht 70.0 in | Wt 165.6 lb

## 2023-03-19 DIAGNOSIS — Z7984 Long term (current) use of oral hypoglycemic drugs: Secondary | ICD-10-CM | POA: Diagnosis not present

## 2023-03-19 DIAGNOSIS — I499 Cardiac arrhythmia, unspecified: Secondary | ICD-10-CM

## 2023-03-19 DIAGNOSIS — E1142 Type 2 diabetes mellitus with diabetic polyneuropathy: Secondary | ICD-10-CM | POA: Diagnosis not present

## 2023-03-19 DIAGNOSIS — N400 Enlarged prostate without lower urinary tract symptoms: Secondary | ICD-10-CM | POA: Diagnosis not present

## 2023-03-19 LAB — POCT GLYCOSYLATED HEMOGLOBIN (HGB A1C): Hemoglobin A1C: 7.3 % — AB (ref 4.0–5.6)

## 2023-03-19 NOTE — Assessment & Plan Note (Signed)
Chronic issue.  A1c 7.3 is adequately controlled for his age.  Patient will continue Farxiga 10 mg daily, Tradjenta 5 mg daily, and metformin 500 mg twice daily.  Encouraged him to monitor his diet.

## 2023-03-19 NOTE — Progress Notes (Signed)
[name], MD Phone: [phone]  James Hood is a 82 y.o. male who presents today for follow-up.  Diabetes: 14-day average 22% above goal and 78% in target range.  He is taking Tradjenta and metformin as well as [place].  He does have some polyuria.  No hypoglycemia.  Incontinence: Patient has chronic issues with urinary incontinence.  Notes he wears depends.  He has to change this 3 times a day.  He does not strain when urinating.  He does not feel like his bladder empties.  He notes no dysuria.  He is on Flomax.  Irregular heartbeat: Patient wonders if he needs to see cardiology for this.  Prior EKG revealed PVC.  He notes no palpitations.  Social History   Tobacco Use  Smoking Status Every Day   Current packs/day: 0.50   Average packs/day: 0.5 packs/day for 67.8 years (33.9 ttl pk-yrs)   Types: Cigarettes   Start date: 21  Smokeless Tobacco Never    Current Outpatient Medications on File Prior to Visit  Medication Sig Dispense Refill   aspirin EC 81 MG tablet Take 81 mg by mouth daily.     cholestyramine (QUESTRAN) 4 g packet Take 1 packet by mouth 2 (two) times daily.     Continuous Blood Gluc Sensor (FREESTYLE LIBRE 14 DAY SENSOR) MISC APPLY 1 SENSOR EVERY 14 DAYS TO MONITOR BLOOD GLUCOSE LEVELS. REMOVE OLD SENSOR BEFORE APPLYING NEW SENSOR 2 each 5   Continuous Glucose Sensor (FREESTYLE LIBRE 14 DAY SENSOR) MISC APPLY SENSOR EVERY 14 DAYS TO MONITOR BLOOD GLUCOSE LEVELS. REMOVE OLD SENSOR BEFORE APPLYING NEW SENSOR 2 each 5   cyclobenzaprine (FLEXERIL) 10 MG tablet Take 1 tablet (10 mg total) by mouth 3 (three) times daily as needed for muscle spasms. 30 tablet 0   diphenoxylate-atropine (LOMOTIL) 2.5-0.025 MG tablet Take by mouth.     divalproex (DEPAKOTE ER) 500 MG 24 hr tablet Take 1 tablet by mouth 2 (two) times daily.     donepezil (ARICEPT) 10 MG tablet Take 10 mg by mouth at bedtime.     DULoxetine (CYMBALTA) 30 MG capsule Take 2 capsules (60 mg total)  by mouth daily. 60 capsule 3   fenofibrate (TRICOR) 145 MG tablet TAKE 1 TABLET BY MOUTH DAILY 90 tablet 1   gabapentin (NEURONTIN) 600 MG tablet TAKE TWO TABLETS BY MOUTH THREE TIMES A DAY 180 tablet 1   lidocaine (LIDODERM) 5 % Place 1 patch onto the skin every 12 (twelve) hours. Remove & Discard patch within 12 hours or as directed by MD 30 patch 2   linagliptin (TRADJENTA) 5 MG TABS tablet Take 1 tablet (5 mg total) by mouth daily. 30 tablet 5   lisinopril-hydrochlorothiazide (ZESTORETIC) 20-25 MG tablet TAKE 1 TABLET BY MOUTH 2 TIMES A DAY 180 tablet 1   memantine (NAMENDA) 10 MG tablet Take 1 tablet by mouth daily.     metFORMIN (GLUCOPHAGE-XR) 500 MG 24 hr tablet TAKE 1 TABLET BY MOUTH TWICE A DAY 180 tablet 1   metoprolol succinate (TOPROL-XL) 25 MG 24 hr tablet Take 25 mg by mouth daily.     Multiple Vitamins-Minerals (CENTRUM SILVER PO) Take 1 tablet by mouth daily.     omega-3 acid ethyl esters (LOVAZA) 1 g capsule TAKE 2 CAPSULES BY MOUTH TWICE A DAY 360 capsule 3   omeprazole (PRILOSEC) 20 MG capsule Take 20 mg by mouth daily.     [START ON 03/28/2023] oxyCODONE-acetaminophen (PERCOCET) 10-325 MG tablet Take 1 tablet by mouth every  6 (six) hours as needed for pain. 120 tablet 0   [START ON 04/27/2023] oxyCODONE-acetaminophen (PERCOCET) 10-325 MG tablet Take 1 tablet by mouth every 6 (six) hours as needed for pain. 120 tablet 0   [START ON 05/27/2023] oxyCODONE-acetaminophen (PERCOCET) 10-325 MG tablet Take 1 tablet by mouth every 6 (six) hours as needed for pain. 120 tablet 0   pantoprazole (PROTONIX) 40 MG tablet Take 40 mg by mouth daily.     pramipexole (MIRAPEX) 0.125 MG tablet TAKE 1 TABLET BY MOUTH EVERY NIGHT 2-3 HOURS BEFORE BEDTIME 90 tablet 1   primidone (MYSOLINE) 50 MG tablet Take 150-250 mg by mouth 2 (two) times daily. Take 250 mg by mouth in the morning & take 150 mg at night.     QUEtiapine (SEROQUEL) 25 MG tablet Take 75 mg by mouth at bedtime.     rosuvastatin (CRESTOR)  40 MG tablet TAKE ONE TABLET BY MOUTH DAILY 90 tablet 3   tamsulosin (FLOMAX) 0.4 MG CAPS capsule TAKE 2 CAPSULES BY MOUTH DAILY AFTER SUPPER 180 capsule 0   traZODone (DESYREL) 100 MG tablet Take 2 tablets (200 mg total) by mouth at bedtime as needed for sleep. 180 tablet 1   vitamin C (ASCORBIC ACID) 500 MG tablet Take 1,000 mg by mouth daily.     No current facility-administered medications on file prior to visit.     ROS see history of present illness  Objective  Physical Exam Vitals:   03/19/23 0923  BP: 132/82  Pulse: 95  Temp: (!) 97.5 F (36.4 C)  SpO2: 93%    BP Readings from Last 3 Encounters:  03/19/23 132/82  03/12/23 104/65  03/07/23 110/66   Wt Readings from Last 3 Encounters:  03/19/23 165 lb 9.6 oz (75.1 kg)  03/12/23 164 lb (74.4 kg)  03/07/23 165 lb (74.8 kg)    Physical Exam Constitutional:      General: He is not in acute distress.    Appearance: He is not diaphoretic.  Cardiovascular:     Rate and Rhythm: Normal rate and regular rhythm.     Heart sounds: Normal heart sounds.  Pulmonary:     Effort: Pulmonary effort is normal.  Neurological:     Mental Status: He is alert.      Assessment/Plan: Please see individual problem list.  Type 2 diabetes mellitus with diabetic polyneuropathy, without long-term current use of insulin (HCC) Assessment & Plan: Chronic issue.  A1c 7.3 is adequately controlled for his age.  Patient will continue Farxiga 10 mg daily, Tradjenta 5 mg daily, and metformin 500 mg twice daily.  Encouraged him to monitor his diet.   Benign prostatic hyperplasia without lower urinary tract symptoms Assessment & Plan: Chronic issue.  Continues to have symptoms that could be related to BPH.  He will continue Flomax 0.8 mg daily.  Patient notes he will call urology to schedule follow-up.   Irregular heart beat Assessment & Plan: Patient is asymptomatic.  Noted to be PVCs on prior EKG.  Advised that he does not need to see  cardiology given lack of symptoms.  If he has palpitations or develops other symptoms he will let us know.     Return for As scheduled.   [name], MD [facility]

## 2023-03-19 NOTE — Assessment & Plan Note (Addendum)
Chronic issue.  Continues to have symptoms that could be related to BPH.  He will continue Flomax 0.8 mg daily.  Patient notes he will call urology to schedule follow-up.

## 2023-03-19 NOTE — Addendum Note (Signed)
Addended by: Glori Luis on: 03/19/2023 10:37 AM   Modules accepted: Orders

## 2023-03-19 NOTE — Assessment & Plan Note (Signed)
Patient is asymptomatic.  Noted to be PVCs on prior EKG.  Advised that he does not need to see cardiology given lack of symptoms.  If he has palpitations or develops other symptoms he will let us know.

## 2023-03-20 NOTE — Anesthesia Preprocedure Evaluation (Addendum)
Anesthesia Evaluation   Patient awake    Reviewed: Allergy & Precautions, H&P , NPO status , Patient's Chart, lab work & pertinent test results  History of Anesthesia Complications (+) history of anesthetic complications  Airway Mallampati: II  TM Distance: >3 FB Neck ROM: Full    Dental no notable dental hx. (+) Poor Dentition, Missing   Pulmonary Current Smoker   Pulmonary exam normal breath sounds clear to auscultation       Cardiovascular hypertension, Normal cardiovascular exam Rhythm:Regular Rate:Normal     Neuro/Psych  Headaches PSYCHIATRIC DISORDERS Anxiety Depression   Dementia  Neuromuscular disease    GI/Hepatic Neg liver ROS, hiatal hernia, PUD,GERD  ,,  Endo/Other  diabetes Hyperthyroidism   Renal/GU negative Renal ROS  negative genitourinary   Musculoskeletal negative musculoskeletal ROS (+) Arthritis ,    Abdominal   Peds negative pediatric ROS (+)  Hematology  (+) Blood dyscrasia, anemia   Anesthesia Other Findings Previous cataract surgery 03-12-23 Dr. Lorette Ang anesthesiologist. Patient not happy that he was "awake during surgery" Had 1 mg versed and 50 mcg fentanyl  The following is note from that case:  During the end of the procedure, patient went into a [place] type 1 arrhythmia. Patient's vital signs were stable and patient stated he was asymptomatic. Discussed the case with the surgeon due to the most likely cause being the pressure on his eye. Patient finished the procedure within the next 30 seconds. While in PACU, got an EKG. Discussed the case with the patient and his wife. Recommended he follow up with his primary care physician. Wife stated she understood. Explained to the patient if he felt anything abnormal such as chest pain, shortness of breath, flutters in his chest, dizzyness, etc, he should go to the emergency department. Patient stated he understood and agreed.   Today, 03-25-23 nice  discussion with patient, informed him that he may well recall procedure, but will try to keep him as relaxed as safely possible.  Eye drops numb the eye, and we discussed that.   Reproductive/Obstetrics negative OB ROS                             Anesthesia Physical Anesthesia Plan  ASA: 3  Anesthesia Plan: MAC   Post-op Pain Management:    Induction: Intravenous  PONV Risk Score and Plan:   Airway Management Planned: Natural Airway and Nasal Cannula  Additional Equipment:   Intra-op Plan:   Post-operative Plan:   Informed Consent: I have reviewed the patients History and Physical, chart, labs and discussed the procedure including the risks, benefits and alternatives for the proposed anesthesia with the patient or authorized representative who has indicated his/her understanding and acceptance.     Dental Advisory Given  Plan Discussed with: Anesthesiologist, CRNA and Surgeon  Anesthesia Plan Comments: (Patient consented for risks of anesthesia including but not limited to:  - adverse reactions to medications - damage to eyes, teeth, lips or other oral mucosa - nerve damage due to positioning  - sore throat or hoarseness - Damage to heart, brain, nerves, lungs, other parts of body or loss of life  Patient voiced understanding and assent.)        Anesthesia Quick Evaluation

## 2023-03-21 ENCOUNTER — Encounter: Payer: PPO | Admitting: Student in an Organized Health Care Education/Training Program

## 2023-03-22 NOTE — Discharge Instructions (Signed)

## 2023-03-25 ENCOUNTER — Encounter: Payer: Self-pay | Admitting: Ophthalmology

## 2023-03-25 ENCOUNTER — Telehealth: Payer: PPO | Admitting: Student in an Organized Health Care Education/Training Program

## 2023-03-25 ENCOUNTER — Ambulatory Visit: Payer: PPO | Admitting: Anesthesiology

## 2023-03-25 ENCOUNTER — Encounter
Admission: RE | Disposition: A | Discharge status: Home / Self Care | Payer: Self-pay | Source: Home / Self Care | Attending: Ophthalmology

## 2023-03-25 ENCOUNTER — Ambulatory Visit: Payer: PPO

## 2023-03-25 ENCOUNTER — Other Ambulatory Visit: Payer: Self-pay

## 2023-03-25 ENCOUNTER — Ambulatory Visit
Admission: RE | Admit: 2023-03-25 | Discharge: 2023-03-25 | Disposition: A | Discharge status: Home / Self Care | Payer: PPO | Attending: Ophthalmology | Admitting: Ophthalmology

## 2023-03-25 DIAGNOSIS — K219 Gastro-esophageal reflux disease without esophagitis: Secondary | ICD-10-CM | POA: Diagnosis not present

## 2023-03-25 DIAGNOSIS — F17210 Nicotine dependence, cigarettes, uncomplicated: Secondary | ICD-10-CM | POA: Diagnosis not present

## 2023-03-25 DIAGNOSIS — Z5986 Financial insecurity: Secondary | ICD-10-CM | POA: Diagnosis not present

## 2023-03-25 DIAGNOSIS — F0394 Unspecified dementia, unspecified severity, with anxiety: Secondary | ICD-10-CM | POA: Insufficient documentation

## 2023-03-25 DIAGNOSIS — F0393 Unspecified dementia, unspecified severity, with mood disturbance: Secondary | ICD-10-CM | POA: Diagnosis not present

## 2023-03-25 DIAGNOSIS — Z7984 Long term (current) use of oral hypoglycemic drugs: Secondary | ICD-10-CM | POA: Diagnosis not present

## 2023-03-25 DIAGNOSIS — F32A Depression, unspecified: Secondary | ICD-10-CM | POA: Insufficient documentation

## 2023-03-25 DIAGNOSIS — E1140 Type 2 diabetes mellitus with diabetic neuropathy, unspecified: Secondary | ICD-10-CM | POA: Diagnosis not present

## 2023-03-25 DIAGNOSIS — Z9841 Cataract extraction status, right eye: Secondary | ICD-10-CM | POA: Diagnosis not present

## 2023-03-25 DIAGNOSIS — K449 Diaphragmatic hernia without obstruction or gangrene: Secondary | ICD-10-CM | POA: Insufficient documentation

## 2023-03-25 DIAGNOSIS — H2512 Age-related nuclear cataract, left eye: Secondary | ICD-10-CM | POA: Diagnosis not present

## 2023-03-25 DIAGNOSIS — Z961 Presence of intraocular lens: Secondary | ICD-10-CM | POA: Insufficient documentation

## 2023-03-25 DIAGNOSIS — I10 Essential (primary) hypertension: Secondary | ICD-10-CM | POA: Insufficient documentation

## 2023-03-25 DIAGNOSIS — E1136 Type 2 diabetes mellitus with diabetic cataract: Secondary | ICD-10-CM | POA: Insufficient documentation

## 2023-03-25 HISTORY — PX: CATARACT EXTRACTION W/PHACO: SHX586

## 2023-03-25 HISTORY — DX: Other complications of anesthesia, initial encounter: T88.59XA

## 2023-03-25 LAB — GLUCOSE, CAPILLARY: Glucose-Capillary: 146 mg/dL — ABNORMAL HIGH (ref 70–99)

## 2023-03-25 SURGERY — PHACOEMULSIFICATION, CATARACT, WITH IOL INSERTION
Anesthesia: Monitor Anesthesia Care | Site: Eye | Laterality: Left

## 2023-03-25 MED ORDER — DEXMEDETOMIDINE HCL IN NACL 80 MCG/20ML IV SOLN
INTRAVENOUS | Status: DC | PRN
  Administered 2023-03-25: 8 ug via INTRAVENOUS

## 2023-03-25 MED ORDER — SODIUM CHLORIDE 0.9% FLUSH: 10.0000 mL | Freq: Two times a day (BID) | INTRAVENOUS | Status: DC

## 2023-03-25 MED ORDER — SIGHTPATH DOSE#1 NA CHONDROIT SULF-NA HYALURON 40-17 MG/ML IO SOLN
INTRAOCULAR | Status: DC | PRN
  Administered 2023-03-25: 1 mL via INTRAOCULAR

## 2023-03-25 MED ORDER — MOXIFLOXACIN HCL 0.5 % OP SOLN
OPHTHALMIC | Status: DC | PRN
  Administered 2023-03-25: .2 mL via OPHTHALMIC

## 2023-03-25 MED ORDER — ARMC OPHTHALMIC DILATING DROPS
1.0000 | OPHTHALMIC | Status: DC | PRN
  Administered 2023-03-25 (×3): 1 via OPHTHALMIC

## 2023-03-25 MED ORDER — SIGHTPATH DOSE#1 BSS IO SOLN
INTRAOCULAR | Status: DC | PRN
  Administered 2023-03-25: 75 mL via OPHTHALMIC

## 2023-03-25 MED ORDER — TETRACAINE HCL 0.5 % OP SOLN
OPHTHALMIC | Status: AC
  Filled 2023-03-25: qty 4

## 2023-03-25 MED ORDER — SODIUM CHLORIDE 0.9% FLUSH: 10.0000 mL | INTRAVENOUS | Status: DC | PRN

## 2023-03-25 MED ORDER — SIGHTPATH DOSE#1 BSS IO SOLN
INTRAOCULAR | Status: DC | PRN
  Administered 2023-03-25: 15 mL via INTRAOCULAR

## 2023-03-25 MED ORDER — ARMC OPHTHALMIC DILATING DROPS
OPHTHALMIC | Status: AC
  Filled 2023-03-25: qty 0.5

## 2023-03-25 MED ORDER — TETRACAINE HCL 0.5 % OP SOLN
1.0000 [drp] | OPHTHALMIC | Status: DC | PRN
  Administered 2023-03-25 (×3): 1 [drp] via OPHTHALMIC

## 2023-03-25 MED ORDER — SIGHTPATH DOSE#1 BSS IO SOLN
INTRAOCULAR | Status: DC | PRN
  Administered 2023-03-25: 2 mL

## 2023-03-25 MED ORDER — FENTANYL CITRATE (PF) 100 MCG/2ML IJ SOLN
INTRAMUSCULAR | Status: DC | PRN
  Administered 2023-03-25 (×2): 50 ug via INTRAVENOUS

## 2023-03-25 MED ORDER — FENTANYL CITRATE (PF) 100 MCG/2ML IJ SOLN
INTRAMUSCULAR | Status: AC
  Filled 2023-03-25: qty 2

## 2023-03-25 MED ORDER — BRIMONIDINE TARTRATE-TIMOLOL 0.2-0.5 % OP SOLN
OPHTHALMIC | Status: DC | PRN
  Administered 2023-03-25: 1 [drp] via OPHTHALMIC

## 2023-03-25 SURGICAL SUPPLY — 12 items
ANGLE REVERSE CUT SHRT 25GA (CUTTER) ×1
CANNULA ANT/CHMB 27G (MISCELLANEOUS) IMPLANT
CANNULA ANT/CHMB 27GA (MISCELLANEOUS)
CATARACT SUITE SIGHTPATH (MISCELLANEOUS) ×1
CYSTOTOME ANGL RVRS SHRT 25G (CUTTER) ×1 IMPLANT
FEE CATARACT SUITE SIGHTPATH (MISCELLANEOUS) ×1 IMPLANT
GLOVE BIOGEL PI IND STRL 8 (GLOVE) ×1 IMPLANT
GLOVE SURG LX STRL 8.0 MICRO (GLOVE) ×1 IMPLANT
LENS IOL TECNIS EYHANCE 15.5 (Intraocular Lens) IMPLANT
NDL FILTER BLUNT 18X1 1/2 (NEEDLE) ×1 IMPLANT
NEEDLE FILTER BLUNT 18X1 1/2 (NEEDLE) ×1
SYR 3ML LL SCALE MARK (SYRINGE) ×1 IMPLANT

## 2023-03-25 NOTE — Anesthesia Postprocedure Evaluation (Signed)
Anesthesia Post Note  Patient: James Hood  Procedure(s) Performed: CATARACT EXTRACTION PHACO AND INTRAOCULAR LENS PLACEMENT (IOC) LEFT DIABETIC 7.15 00:51.0 (Left: Eye)  Patient location during evaluation: PACU Anesthesia Type: MAC Level of consciousness: awake and alert Pain management: pain level controlled Vital Signs Assessment: post-procedure vital signs reviewed and stable Respiratory status: spontaneous breathing, nonlabored ventilation, respiratory function stable and patient connected to nasal cannula oxygen Cardiovascular status: stable and blood pressure returned to baseline Postop Assessment: no apparent nausea or vomiting Anesthetic complications: no   No notable events documented.   Last Vitals:  Vitals:   03/25/23 1000 03/25/23 1007  BP: (!) 90/48 (!) 98/52  Pulse: 75 72  Resp: 14 15  Temp:  (!) 36.1 C  SpO2: 97% 96%    Last Pain:  Vitals:   03/25/23 1007  TempSrc:   PainSc: 0-No pain                 Adom Schoeneck C Maahir Horst

## 2023-03-25 NOTE — Progress Notes (Deleted)
Pt presented for a POC A1C. Pt was identified through two identifiers. Pt was explained the process of collecting an A1C and consent to proceed was given. Pts A1C was collected and recorded. Pt was informed of result and that his pcp would contact him with any advise/information. Pt verbalized understanding and checked out.

## 2023-03-25 NOTE — Op Note (Signed)
PREOPERATIVE DIAGNOSIS:  Nuclear sclerotic cataract of the left eye.   POSTOPERATIVE DIAGNOSIS:  Nuclear sclerotic cataract of the left eye.   OPERATIVE PROCEDURE:ORPROCALL@   SURGEON:  Galen Manila, MD.   ANESTHESIA:  Anesthesiologist: Marisue Humble, MD CRNA: Andee Poles, CRNA  1.      Managed anesthesia care. 2.     0.4ml of Shugarcaine was instilled following the paracentesis   COMPLICATIONS:  None.   TECHNIQUE:   Stop and chop   DESCRIPTION OF PROCEDURE:  The patient was examined and consented in the preoperative holding area where the aforementioned topical anesthesia was applied to the left eye and then brought back to the Operating Room where the left eye was prepped and draped in the usual sterile ophthalmic fashion and a lid speculum was placed. A paracentesis was created with the side port blade and the anterior chamber was filled with viscoelastic. A near clear corneal incision was performed with the steel keratome. A continuous curvilinear capsulorrhexis was performed with a cystotome followed by the capsulorrhexis forceps. Hydrodissection and hydrodelineation were carried out with BSS on a blunt cannula. The lens was removed in a stop and chop  technique and the remaining cortical material was removed with the irrigation-aspiration handpiece. The capsular bag was inflated with viscoelastic and the Technis [id number] lens was placed in the capsular bag without complication. The remaining viscoelastic was removed from the eye with the irrigation-aspiration handpiece. The wounds were hydrated. The anterior chamber was flushed with BSS and the eye was inflated to physiologic pressure. 0.44ml Vigamox was placed in the anterior chamber. The wounds were found to be water tight. The eye was dressed with [place]. The patient was given protective glasses to wear throughout the day and a shield with which to sleep tonight. The patient was also given drops with which to begin a drop regimen  today and will follow-up with me in one day. Implant Name Type Inv. Item Serial No. Manufacturer Lot No. LRB No. Used Action  LENS IOL TECNIS EYHANCE 15.5 - [id number] Intraocular Lens LENS IOL TECNIS EYHANCE 15.5 [id number] [employer]  Left 1 Implanted    Procedure(s): CATARACT EXTRACTION PHACO AND INTRAOCULAR LENS PLACEMENT (IOC) LEFT DIABETIC 7.15 00:51.0 (Left)  Electronically signed: Galen Manila 03/25/2023 9:53 AM

## 2023-03-25 NOTE — Transfer of Care (Signed)
Immediate Anesthesia Transfer of Care Note  Patient: James Hood  Procedure(s) Performed: CATARACT EXTRACTION PHACO AND INTRAOCULAR LENS PLACEMENT (IOC) LEFT DIABETIC 7.15 00:51.0 (Left: Eye)  Patient Location: PACU  Anesthesia Type: MAC  Level of Consciousness: awake, alert  and patient cooperative  Airway and Oxygen Therapy: Patient Spontanous Breathing and Patient connected to supplemental oxygen  Post-op Assessment: Post-op Vital signs reviewed, Patient's Cardiovascular Status Stable, Respiratory Function Stable, Patent Airway and No signs of Nausea or vomiting  Post-op Vital Signs: Reviewed and stable  Complications: No notable events documented.

## 2023-03-25 NOTE — H&P (Signed)
[facility]   Primary Care Physician:  Glori Luis, MD Ophthalmologist: Dr. Druscilla Brownie  Pre-Procedure History & Physical: HPI:  James Hood is a 82 y.o. male here for cataract surgery.   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   Colon polyps    Complication of anesthesia    Woke during first cataract procedure   Dementia (HCC)    Depression    Diabetes mellitus type 2 in nonobese (HCC)    GERD (gastroesophageal reflux disease)    BARRETTS ESOPHAGUS RESOLVED PER PATIENT   Headache    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HCC)    History of hiatal hernia    Hypercholesteremia    Hypertension    Hyperthyroidism    Neuropathic pain    Primary localized osteoarthritis of right knee    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   Stomach ulcer    Tremor, essential    Wears dentures    full upper   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/02/2018    Past Surgical History:  Procedure Laterality Date   ANKLE ARTHROSCOPY Left 09/29/2019   Procedure: LEFT ANKLE ARTHROSCOPY, DEBRIDEMENT;  Surgeon: Nadara Mustard, MD;  Location: [facility];  Service: Orthopedics;  Laterality: Left;   BACK SURGERY     BIOPSY  01/18/2023   Procedure: BIOPSY;  Surgeon: [name], DO;  Location: [facility] ENDOSCOPY;  Service: Gastroenterology;;   CATARACT EXTRACTION W/PHACO Right 03/12/2023   Procedure: CATARACT EXTRACTION PHACO AND INTRAOCULAR LENS PLACEMENT (IOC) RIGHT DIABETIC 11.25 01:07.6;  Surgeon: Galen Manila, MD;  Location: [facility];  Service: Ophthalmology;  Laterality: Right;   COLONOSCOPY WITH PROPOFOL N/A 09/14/2016   Procedure: COLONOSCOPY WITH PROPOFOL;  Surgeon: Scot Jun, MD;  Location: [facility] ENDOSCOPY;  Service: Endoscopy;  Laterality: N/A;   esophageal stretch      ESOPHAGOGASTRODUODENOSCOPY (EGD) WITH PROPOFOL N/A 12/13/2020   Procedure: ESOPHAGOGASTRODUODENOSCOPY (EGD) WITH PROPOFOL;  Surgeon: Regis Bill, MD;  Location: [facility] ENDOSCOPY;  Service: Endoscopy;  Laterality: N/A;  IDDM   ESOPHAGOGASTRODUODENOSCOPY (EGD) WITH PROPOFOL N/A 01/18/2023   Procedure: ESOPHAGOGASTRODUODENOSCOPY (EGD) WITH PROPOFOL;  Surgeon: [name], DO;  Location: [facility] ENDOSCOPY;  Service: Gastroenterology;  Laterality: N/A;   JOINT REPLACEMENT Right 2016   knee   LUMBAR LAMINECTOMY/DECOMPRESSION MICRODISCECTOMY Left 02/03/2016   Procedure: LEFT L5-S1 DISKECTOMY;  Surgeon: Hilda Lias, MD;  Location: [facility]URO ORS;  Service: Neurosurgery;  Laterality: Left;  LEFT L5-S1 DISKECTOMY   MALONEY DILATION  01/18/2023   Procedure: MALONEY DILATION;  Surgeon: [name], DO;  Location: [facility] ENDOSCOPY;  Service: Gastroenterology;;   PULSE GENERATOR IMPLANT N/A 08/21/2017   Procedure: UNILATERAL PULSE GENERATOR IMPLANT;  Surgeon: Venetia Night, MD;  Location: [facility] ORS;  Service: Neurosurgery;  Laterality: N/A;   PULSE GENERATOR IMPLANT Right 04/02/2018   Procedure: REMOVAL OF PULSE GENERATOR IMPLANT AND LEADS;  Surgeon: Venetia Night, MD;  Location: [facility] ORS;  Service: Neurosurgery;  Laterality: Right;   TONSILLECTOMY     TOTAL KNEE ARTHROPLASTY Right 11/15/2014   Procedure: TOTAL KNEE ARTHROPLASTY;  Surgeon: Salvatore Marvel, MD;  Location: [facility] OR;  Service: Orthopedics;  Laterality: Right;    Prior to Admission medications   Medication Sig Start Date End Date Taking? Authorizing Provider  aspirin EC 81 MG tablet Take 81 mg  by mouth daily.   Yes [provider]  cholestyramine (QUESTRAN) 4 g packet Take 1 packet by mouth 2 (two) times daily. 09/24/21  Yes [provider]  Continuous Blood Gluc Sensor (FREESTYLE LIBRE 14 DAY SENSOR) MISC APPLY 1 SENSOR EVERY 14 DAYS TO MONITOR BLOOD GLUCOSE LEVELS. REMOVE OLD SENSOR BEFORE APPLYING NEW  SENSOR 02/27/22  Yes Glori Luis, MD  Continuous Glucose Sensor (FREESTYLE LIBRE 14 DAY SENSOR) MISC APPLY SENSOR EVERY 14 DAYS TO MONITOR BLOOD GLUCOSE LEVELS. REMOVE OLD SENSOR BEFORE APPLYING NEW SENSOR 11/07/22  Yes Glori Luis, MD  cyclobenzaprine (FLEXERIL) 10 MG tablet Take 1 tablet (10 mg total) by mouth 3 (three) times daily as needed for muscle spasms. 02/27/23  Yes Glori Luis, MD  diphenoxylate-atropine (LOMOTIL) 2.5-0.025 MG tablet Take by mouth. 10/31/21  Yes [provider]  divalproex (DEPAKOTE ER) 500 MG 24 hr tablet Take 1 tablet by mouth 2 (two) times daily. 03/20/18  Yes [provider]  donepezil (ARICEPT) 10 MG tablet Take 10 mg by mouth at bedtime.   Yes [provider]  DULoxetine (CYMBALTA) 30 MG capsule Take 2 capsules (60 mg total) by mouth daily. 12/11/22  Yes Glori Luis, MD  fenofibrate (TRICOR) 145 MG tablet TAKE 1 TABLET BY MOUTH DAILY 12/14/22  Yes Glori Luis, MD  gabapentin (NEURONTIN) 600 MG tablet TAKE TWO TABLETS BY MOUTH THREE TIMES A DAY 11/02/19  Yes Glori Luis, MD  linagliptin (TRADJENTA) 5 MG TABS tablet Take 1 tablet (5 mg total) by mouth daily. 12/06/22  Yes Glori Luis, MD  lisinopril-hydrochlorothiazide (ZESTORETIC) 20-25 MG tablet TAKE 1 TABLET BY MOUTH 2 TIMES A DAY 02/22/23  Yes Glori Luis, MD  memantine (NAMENDA) 10 MG tablet Take 1 tablet by mouth daily. 10/18/20  Yes [provider]  metFORMIN (GLUCOPHAGE-XR) 500 MG 24 hr tablet TAKE 1 TABLET BY MOUTH TWICE A DAY 01/09/23  Yes Glori Luis, MD  metoprolol succinate (TOPROL-XL) 25 MG 24 hr tablet Take 25 mg by mouth daily. 01/15/20  Yes [provider]  Multiple Vitamins-Minerals (CENTRUM SILVER PO) Take 1 tablet by mouth daily.   Yes [provider]  omega-3 acid ethyl esters (LOVAZA) 1 g capsule TAKE 2 CAPSULES BY MOUTH TWICE A DAY 11/07/22  Yes Glori Luis, MD  omeprazole (PRILOSEC) 20  MG capsule Take 20 mg by mouth daily. 04/01/16  Yes [provider]  oxyCODONE-acetaminophen (PERCOCET) 10-325 MG tablet Take 1 tablet by mouth every 6 (six) hours as needed for pain. 03/28/23 04/27/23 Yes Edward Jolly, MD  oxyCODONE-acetaminophen (PERCOCET) 10-325 MG tablet Take 1 tablet by mouth every 6 (six) hours as needed for pain. 04/27/23 05/27/23 Yes Edward Jolly, MD  oxyCODONE-acetaminophen (PERCOCET) 10-325 MG tablet Take 1 tablet by mouth every 6 (six) hours as needed for pain. 05/27/23 06/26/23 Yes Edward Jolly, MD  pantoprazole (PROTONIX) 40 MG tablet Take 40 mg by mouth daily. 07/14/22  Yes [provider]  pramipexole (MIRAPEX) 0.125 MG tablet TAKE 1 TABLET BY MOUTH EVERY NIGHT 2-3 HOURS BEFORE BEDTIME 10/17/22  Yes Glori Luis, MD  primidone (MYSOLINE) 50 MG tablet Take 150-250 mg by mouth 2 (two) times daily. Take 250 mg by mouth in the morning & take 150 mg at night.   Yes [provider]  QUEtiapine (SEROQUEL) 25 MG tablet Take 75 mg by mouth at bedtime.   Yes [provider]  rosuvastatin (CRESTOR) 40 MG tablet TAKE ONE TABLET  BY MOUTH DAILY 07/31/22  Yes Glori Luis, MD  tamsulosin (FLOMAX) 0.4 MG CAPS capsule TAKE 2 CAPSULES BY MOUTH DAILY AFTER SUPPER 07/23/22  Yes Glori Luis, MD  traZODone (DESYREL) 100 MG tablet Take 2 tablets (200 mg total) by mouth at bedtime as needed for sleep. 03/02/23 08/29/23 Yes Hisada, Barbee Cough, MD  vitamin C (ASCORBIC ACID) 500 MG tablet Take 1,000 mg by mouth daily.   Yes [provider]  dapagliflozin propanediol (FARXIGA) 10 MG TABS tablet Take 10 mg by mouth daily. Patient not taking: Reported on 03/25/2023    [provider]  lidocaine (LIDODERM) 5 % Place 1 patch onto the skin every 12 (twelve) hours. Remove & Discard patch within 12 hours or as directed by MD 08/16/22   Jerrol Banana, MD    Allergies as of 02/12/2023   (No Known Allergies)    Family History  Problem Relation  Age of Onset   Heart attack Mother    Heart disease Mother    Hypertension Father    Depression Father    Kidney cancer Neg Hx    Prostate cancer Neg Hx     Social History   Socioeconomic History   Marital status: Married    Spouse name: Not on file   Number of children: Not on file   Years of education: Not on file   Highest education level: Master's degree (e.g., MA, MS, MEng, MEd, MSW, MBA)  Occupational History   Not on file  Tobacco Use   Smoking status: Every Day    Current packs/day: 0.50    Average packs/day: 0.5 packs/day for 67.8 years (33.9 ttl pk-yrs)    Types: Cigarettes    Start date: 76   Smokeless tobacco: Never  Vaping Use   Vaping status: Former  Substance and Sexual Activity   Alcohol use: Not Currently    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   Drug use: Never   Sexual activity: Not Currently  Other Topics Concern   Not on file  Social History Narrative   Not on file   Social Determinants of Health   Financial Resource Strain: Medium Risk (03/15/2023)   Overall Financial Resource Strain (CARDIA)    Difficulty of Paying Living Expenses: Somewhat hard  Food Insecurity: No Food Insecurity (03/15/2023)   Hunger Vital Sign    Worried About Running Out of Food in the Last Year: Never true    Ran Out of Food in the Last Year: Never true  Transportation Needs: No Transportation Needs (03/15/2023)   PRAPARE - Administrator, Civil Service (Medical): No    Lack of Transportation (Non-Medical): No  Physical Activity: Inactive (03/15/2023)   Exercise Vital Sign    Days of Exercise per Week: 0 days    Minutes of Exercise per Session: 0 min  Stress: No Stress Concern Present (03/15/2023)   [employer] of Occupational Health - Occupational Stress Questionnaire    Feeling of Stress : Only a little  Social Connections: Socially Integrated (03/15/2023)   Social Connection and Isolation Panel [NHANES]    Frequency of Communication  with Friends and Family: More than three times a week    Frequency of Social Gatherings with Friends and Family: More than three times a week    Attends Religious Services: More than 4 times per year    Active Member of Clubs or Organizations: No    Attends Engineer, structural: More than 4 times per year  Marital Status: Married  Catering manager Violence: Not At Risk (12/31/2022)   Humiliation, Afraid, Rape, and Kick questionnaire    Fear of Current or Ex-Partner: No    Emotionally Abused: No    Physically Abused: No    Sexually Abused: No    Review of Systems: See HPI, otherwise negative ROS  Physical Exam: BP 119/67   Pulse 72   Temp (!) 97.3 F (36.3 C) (Temporal)   Resp 15   Ht 5\' 10"  (1.778 m)   Wt 73.7 kg   SpO2 95%   BMI 23.30 kg/m  General:   Alert, cooperative in NAD Head:  Normocephalic and atraumatic. Respiratory:  Normal work of breathing. Cardiovascular:  RRR  Impression/Plan: James Hood is here for cataract surgery.  Risks, benefits, limitations, and alternatives regarding cataract surgery have been reviewed with the patient.  Questions have been answered.  All parties agreeable.   Galen Manila, MD  03/25/2023, 9:26 AM

## 2023-03-26 ENCOUNTER — Telehealth: Payer: Self-pay

## 2023-03-26 ENCOUNTER — Encounter: Payer: Self-pay | Admitting: Ophthalmology

## 2023-03-26 NOTE — Telephone Encounter (Signed)
Patient states he would like for Dr. [name] to call in a meter to read the sensor.  Patient states his preferred pharmacy is [facility] in [place].

## 2023-03-27 ENCOUNTER — Ambulatory Visit
Payer: PPO | Attending: Student in an Organized Health Care Education/Training Program | Admitting: Student in an Organized Health Care Education/Training Program

## 2023-03-27 ENCOUNTER — Telehealth: Payer: PPO | Admitting: Student in an Organized Health Care Education/Training Program

## 2023-03-27 ENCOUNTER — Ambulatory Visit
Admission: RE | Admit: 2023-03-27 | Discharge: 2023-03-27 | Disposition: A | Discharge status: Home / Self Care | Payer: PPO | Source: Ambulatory Visit | Attending: Student in an Organized Health Care Education/Training Program | Admitting: Student in an Organized Health Care Education/Training Program

## 2023-03-27 VITALS — BP 133/99 | HR 88 | Temp 97.3°F | Resp 16 | Ht 70.0 in | Wt 163.0 lb

## 2023-03-27 DIAGNOSIS — M48061 Spinal stenosis, lumbar region without neurogenic claudication: Secondary | ICD-10-CM | POA: Diagnosis not present

## 2023-03-27 DIAGNOSIS — M5417 Radiculopathy, lumbosacral region: Secondary | ICD-10-CM | POA: Diagnosis not present

## 2023-03-27 MED ORDER — SODIUM CHLORIDE 0.9% FLUSH
2.0000 mL | Freq: Once | INTRAVENOUS | Status: AC
  Administered 2023-03-27: 2 mL

## 2023-03-27 MED ORDER — DEXAMETHASONE SODIUM PHOSPHATE 10 MG/ML IJ SOLN
INTRAMUSCULAR | Status: AC
  Filled 2023-03-27: qty 2

## 2023-03-27 MED ORDER — IOHEXOL 180 MG/ML  SOLN
10.0000 mL | Freq: Once | INTRAMUSCULAR | Status: AC
  Administered 2023-03-27: 10 mL via EPIDURAL

## 2023-03-27 MED ORDER — LIDOCAINE HCL 2 % IJ SOLN
20.0000 mL | Freq: Once | INTRAMUSCULAR | Status: AC
  Administered 2023-03-27: 400 mg

## 2023-03-27 MED ORDER — DEXAMETHASONE SODIUM PHOSPHATE 10 MG/ML IJ SOLN
20.0000 mg | Freq: Once | INTRAMUSCULAR | Status: AC
  Administered 2023-03-27: 20 mg

## 2023-03-27 MED ORDER — ROPIVACAINE HCL 2 MG/ML IJ SOLN
INTRAMUSCULAR | Status: AC
Start: 2023-03-27 — End: ?
  Filled 2023-03-27: qty 20

## 2023-03-27 MED ORDER — IOHEXOL 180 MG/ML  SOLN
INTRAMUSCULAR | Status: AC
Start: 2023-03-27 — End: ?
  Filled 2023-03-27: qty 20

## 2023-03-27 MED ORDER — ROPIVACAINE HCL 2 MG/ML IJ SOLN
2.0000 mL | Freq: Once | INTRAMUSCULAR | Status: AC
  Administered 2023-03-27: 2 mL via EPIDURAL

## 2023-03-27 MED ORDER — LIDOCAINE HCL 2 % IJ SOLN
INTRAMUSCULAR | Status: AC
  Filled 2023-03-27: qty 20

## 2023-03-27 NOTE — Patient Instructions (Signed)
Pain Management Discharge Instructions  General Discharge Instructions :  If you need to reach your doctor call: Monday-Friday 8:00 am - 4:00 pm at [phone] or toll free [phone].  After clinic hours [phone] to have operator reach doctor.  Bring all of your medication bottles to all your appointments in the pain clinic.  To cancel or reschedule your appointment with Pain Management please remember to call 24 hours in advance to avoid a fee.  Refer to the educational materials which you have been given on: General Risks, I had my Procedure. Discharge Instructions, Post Sedation.  Post Procedure Instructions:  The drugs you were given will stay in your system until tomorrow, so for the next 24 hours you should not drive, make any legal decisions or drink any alcoholic beverages.  You may eat anything you prefer, but it is better to start with liquids then soups and crackers, and gradually work up to solid foods.  Please notify your doctor immediately if you have any unusual bleeding, trouble breathing or pain that is not related to your normal pain.  Depending on the type of procedure that was done, some parts of your body may feel week and/or numb.  This usually clears up by tonight or the next day.  Walk with the use of an assistive device or accompanied by an adult for the 24 hours.  You may use ice on the affected area for the first 24 hours.  Put ice in a Ziploc bag and cover with a towel and place against area 15 minutes on 15 minutes off.  You may switch to heat after 24 hours.Selective Nerve Root Block Patient Information  Description: Specific nerve roots exit the spinal canal and these nerves can be compressed and inflamed by a bulging disc and bone spurs.  By injecting steroids on the nerve root, we can potentially decrease the inflammation surrounding these nerves, which often leads to decreased pain.  Also, by injecting local anesthesia on the nerve root, this can  provide us helpful information to give to your referring doctor if it decreases your pain.  Selective nerve root blocks can be done along the spine from the neck to the low back depending on the location of your pain.   After numbing the skin with local anesthesia, a small needle is passed to the nerve root and the position of the needle is verified using x-ray pictures.  After the needle is in correct position, we then deposit the medication.  You may experience a pressure sensation while this is being done.  The entire block usually lasts less than 15 minutes.  Conditions that may be treated with selective nerve root blocks: Low back and leg pain Spinal stenosis Diagnostic block prior to potential surgery Neck and arm pain Post laminectomy syndrome  Preparation for the injection:  Do not eat any solid food or dairy products within 8 hours of your appointment. You may drink clear liquids up to 3 hours before an appointment.  Clear liquids include water, black coffee, juice or soda.  No milk or cream please. You may take your regular medications, including pain medications, with a sip of water before your appointment.  Diabetics should hold regular insulin (if taken separately) and take 1/2 normal NPH dose the morning of the procedure.  Carry some sugar containing items with you to your appointment. A driver must accompany you and be prepared to drive you home after your procedure. Bring all your current medications with you. An IV   may be inserted and sedation may be given at the discretion of the physician. A blood pressure cuff, EKG, and other monitors will often be applied during the procedure.  Some patients may need to have extra oxygen administered for a short period. You will be asked to provide medical information, including allergies, prior to the procedure.  We must know immediately if you are taking blood  Thinners (like Coumadin) or if you are allergic to IV iodine contrast  (dye).  Possible side-effects: All are usually temporary Bleeding from needle site Light headedness Numbness and tingling Decreased blood pressure Weakness in arms/legs Pressure sensation in back/neck Pain at injection site (several days)  Possible complications: All are extremely rare Infection Nerve injury Spinal headache (a headache wore with upright position)  Call if you experience: Fever/chills associated with headache or increased back/neck pain Headache worsened by an upright position New onset weakness or numbness of an extremity below the injection site Hives or difficulty breathing (go to the emergency room) Inflammation or drainage at the injection site(s) Severe back/neck pain greater than usual New symptoms which are concerning to you  Please note:  Although the local anesthetic injected can often make your back or neck feel good for several hours after the injection the pain will likely return.  It takes 3-5 days for steroids to work on the nerve root. You may not notice any pain relief for at least one week.  If effective, we will often do a series of 3 injections spaced 3-6 weeks apart to maximally decrease your pain.    If you have any questions, please call [phone]  [facility] 

## 2023-03-27 NOTE — Progress Notes (Signed)
PROVIDER NOTE: Interpretation of information contained herein should be left to medically-trained personnel. Specific patient instructions are provided elsewhere under "Patient Instructions" section of medical record. This document was created in part using STT-dictation technology, any transcriptional errors that may result from this process are unintentional.  Patient: James Hood Type: Established DOB: 30-Dec-1940 MRN: [id number] PCP: Glori Luis, MD  Service: Procedure DOS: 03/27/2023 Setting: Ambulatory Location: Ambulatory outpatient facility Delivery: Face-to-face Provider: Edward Jolly, MD Specialty: Interventional Pain Management Specialty designation: 09 Location: Outpatient facility Ref. Prov.: Glori Luis, MD       Interventional Therapy   Procedure: Lumbar trans-foraminal epidural steroid injection (L-TFESI) #1  Laterality: Left (-LT)  Level: L4 and L5  nerve root(s) Imaging: Fluoroscopy-guided         Anesthesia: Local anesthesia (1-2% Lidocaine) Sedation: Minimal Sedation                       DOS: 03/27/2023  Performed by: Edward Jolly, MD  Purpose: Diagnostic/Therapeutic Indications: Lumbar radicular pain severe enough to impact quality of life or function. 1. 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 Lumbosacral radiculopathy     NAS-11 Pain score:   Pre-procedure: 4 /10   Post-procedure: 4 /10     Position / Prep / Materials:  Position: Prone  Prep solution: DuraPrep (Iodine Povacrylex [0.7% available iodine] and Isopropyl Alcohol, 74% w/w) Prep Area: Entire Posterior Lumbosacral Area.  From the lower tip of the scapula down to the tailbone and from flank to flank. Materials:  Tray: Block Needle(s):  Type: Spinal  Gauge (G): 22  Length: 3.5-in  Qty: 2      H&P (Pre-op Assessment):  James Hood is a 82 y.o. (year old), male patient, seen today for interventional treatment. 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 James Hood has a current medication list which includes the following prescription(s): aspirin ec, cholestyramine, freestyle libre 14 day sensor, freestyle libre 14 day sensor, cyclobenzaprine, dapagliflozin propanediol, diphenoxylate-atropine, divalproex, donepezil, duloxetine, fenofibrate, gabapentin, lidocaine, linagliptin, lisinopril-hydrochlorothiazide, memantine, metformin, metoprolol succinate, multiple vitamins-minerals, omega-3 acid ethyl esters, omeprazole, [START ON 03/28/2023] oxycodone-acetaminophen, [START ON 04/27/2023] oxycodone-acetaminophen, [START ON 05/27/2023] oxycodone-acetaminophen, pantoprazole, pramipexole, primidone, quetiapine, rosuvastatin, tamsulosin, trazodone, and ascorbic acid. His primarily concern today is the Back Pain (lower)  Initial Vital Signs:  Pulse/HCG Rate: 88ECG Heart Rate: 87 Temp: (!) 97.3 F (36.3 C) Resp: 16 BP: (!) 145/89 SpO2: 97 %  BMI: Estimated body mass index is 23.39 kg/m as calculated from the following:   Height as of this encounter: 5\' 10"  (1.778 m).   Weight as of this encounter: 163 lb (73.9 kg).  Risk Assessment: Allergies: Reviewed. He has No Known Allergies.  Allergy Precautions: None required Coagulopathies: Reviewed. None identified.  Blood-thinner therapy: None at this time Active  Infection(s): Reviewed. None identified. James Hood is afebrile  Site Confirmation: James Hood was asked to confirm the procedure and laterality before marking the site Procedure checklist: Completed Consent: Before the procedure and under the influence of no sedative(s), amnesic(s), or  anxiolytics, the patient was informed of the treatment options, risks and possible complications. To fulfill our ethical and legal obligations, as recommended by the American Medical Association's Code of Ethics, I have informed the patient of my clinical impression; the nature and purpose of the treatment or procedure; the risks, benefits, and possible complications of the intervention; the alternatives, including doing nothing; the risk(s) and benefit(s) of the alternative treatment(s) or procedure(s); and the risk(s) and benefit(s) of doing nothing. The patient was provided information about the general risks and possible complications associated with the procedure. These may include, but are not limited to: failure to achieve desired goals, infection, bleeding, organ or nerve damage, allergic reactions, paralysis, and death. In addition, the patient was informed of those risks and complications associated to Spine-related procedures, such as failure to decrease pain; infection (i.e.: Meningitis, epidural or intraspinal abscess); bleeding (i.e.: epidural hematoma, subarachnoid hemorrhage, or any other type of intraspinal or peri-dural bleeding); organ or nerve damage (i.e.: Any type of peripheral nerve, nerve root, or spinal cord injury) with subsequent damage to sensory, motor, and/or autonomic systems, resulting in permanent pain, numbness, and/or weakness of one or several areas of the body; allergic reactions; (i.e.: anaphylactic reaction); and/or death. Furthermore, the patient was informed of those risks and complications associated with the medications. These include, but are not limited to: allergic reactions  (i.e.: anaphylactic or anaphylactoid reaction(s)); adrenal axis suppression; blood sugar elevation that in diabetics may result in ketoacidosis or comma; water retention that in patients with history of congestive heart failure may result in shortness of breath, pulmonary edema, and decompensation with resultant heart failure; weight gain; swelling or edema; medication-induced neural toxicity; particulate matter embolism and blood vessel occlusion with resultant organ, and/or nervous system infarction; and/or aseptic necrosis of one or more joints. Finally, the patient was informed that Medicine is not an exact science; therefore, there is also the possibility of unforeseen or unpredictable risks and/or possible complications that may result in a catastrophic outcome. The patient indicated having understood very clearly. We have given the patient no guarantees and we have made no promises. Enough time was given to the patient to ask questions, all of which were answered to the patient's satisfaction. Mr. Silvestro has indicated that he wanted to continue with the procedure. Attestation: I, the ordering provider, attest that I have discussed with the patient the benefits, risks, side-effects, alternatives, likelihood of achieving goals, and potential problems during recovery for the procedure that I have provided informed consent. Date  Time: 03/27/2023 11:06 AM   Pre-Procedure Preparation:  Monitoring: As per clinic protocol. Respiration, ETCO2, SpO2, BP, heart rate and rhythm monitor placed and checked for adequate function Safety Precautions: Patient was assessed for positional comfort and pressure points before starting the procedure. Time-out: I initiated and conducted the "Time-out" before starting the procedure, as per protocol. The patient was asked to participate by confirming the accuracy of the "Time Out" information. Verification of the correct person, site, and procedure were performed and  confirmed by me, the nursing staff, and the patient. "Time-out" conducted as per Joint Commission's Universal Protocol (UP.01.01.01). Time: 1130 Start Time: 1130 hrs.  Description/Narrative of Procedure:          Target: The 6 o'clock position under the pedicle, on the affected side. Region: Posterolateral Lumbosacral Approach: Posterior Percutaneous Paravertebral approach.  Rationale (medical necessity): procedure needed and proper for the diagnosis and/or treatment of the patient's medical symptoms and needs. Procedural Technique Safety Precautions: Aspiration looking for  blood return was conducted prior to all injections. At no point did we inject any substances, as a needle was being advanced. No attempts were made at seeking any paresthesias. Safe injection practices and needle disposal techniques used. Medications properly checked for expiration dates. SDV (single dose vial) medications used. Description of the Procedure: Protocol guidelines were followed. The patient was placed in position over the procedure table. The target area was identified and the area prepped in the usual manner. Skin & deeper tissues infiltrated with local anesthetic. Appropriate amount of time allowed to pass for local anesthetics to take effect. The procedure needles were then advanced to the target area. Proper needle placement secured. Negative aspiration confirmed. Solution injected in intermittent fashion, asking for systemic symptoms every 0.5cc of injectate. The needles were then removed and the area cleansed, making sure to leave some of the prepping solution back to take advantage of its long term bactericidal properties.  Vitals:   03/27/23 1108 03/27/23 1130 03/27/23 1136  BP: (!) 145/89 126/85 (!) 133/99  Pulse: 88    Resp: 16 18 16   Temp: (!) 97.3 F (36.3 C)    TempSrc: Temporal    SpO2: 97% 95% 99%  Weight: 163 lb (73.9 kg)    Height: 5\' 10"  (1.778 m)      Start Time: 1130 hrs. End Time: 1135  hrs.  Imaging Guidance (Spinal):          Type of Imaging Technique: Fluoroscopy Guidance (Spinal) Indication(s): Assistance in needle guidance and placement for procedures requiring needle placement in or near specific anatomical locations not easily accessible without such assistance. Exposure Time: Please see nurses notes. Contrast: Before injecting any contrast, we confirmed that the patient did not have an allergy to iodine, shellfish, or radiological contrast. Once satisfactory needle placement was completed at the desired level, radiological contrast was injected. Contrast injected under live fluoroscopy. No contrast complications. See chart for type and volume of contrast used. Fluoroscopic Guidance: I was personally present during the use of fluoroscopy. "Tunnel Vision Technique" used to obtain the best possible view of the target area. Parallax error corrected before commencing the procedure. "Direction-depth-direction" technique used to introduce the needle under continuous pulsed fluoroscopy. Once target was reached, antero-posterior, oblique, and lateral fluoroscopic projection used confirm needle placement in all planes. Images permanently stored in EMR. Interpretation: I personally interpreted the imaging intraoperatively. Adequate needle placement confirmed in multiple planes. Appropriate spread of contrast into desired area was observed. No evidence of afferent or efferent intravascular uptake. No intrathecal or subarachnoid spread observed. Permanent images saved into the patient's record.  Post-operative Assessment:  Post-procedure Vital Signs:  Pulse/HCG Rate: 8887 Temp: (!) 97.3 F (36.3 C) Resp: 16 BP: (!) 133/99 SpO2: 99 %  EBL: None  Complications: No immediate post-treatment complications observed by team, or reported by patient.  Note: The patient tolerated the entire procedure well. A repeat set of vitals were taken after the procedure and the patient was kept under  observation following institutional policy, for this type of procedure. Post-procedural neurological assessment was performed, showing return to baseline, prior to discharge. The patient was provided with post-procedure discharge instructions, including a section on how to identify potential problems. Should any problems arise concerning this procedure, the patient was given instructions to immediately contact us, at any time, without hesitation. In any case, we plan to contact the patient by telephone for a follow-up status report regarding this interventional procedure.  Comments:  No additional relevant information.  Plan  of Care (POC)  Orders:  Orders Placed This Encounter  Procedures   DG PAIN CLINIC C-ARM 1-60 MIN NO REPORT    Intraoperative interpretation by procedural physician at [facility].    Standing Status:   Standing    Number of Occurrences:   1    Order Specific Question:   Reason for exam:    Answer:   Assistance in needle guidance and placement for procedures requiring needle placement in or near specific anatomical locations not easily accessible without such assistance.   Chronic Opioid Analgesic:   Oxycodone 10 mg every 6 hours as needed, quantity 120/month; MME equals 60    Medications ordered for procedure: Meds ordered this encounter  Medications   iohexol (OMNIPAQUE) 180 MG/ML injection 10 mL    Must be Myelogram-compatible. If not available, you may substitute with a water-soluble, non-ionic, hypoallergenic, myelogram-compatible radiological contrast medium.   lidocaine (XYLOCAINE) 2 % (with pres) injection 400 mg   dexamethasone (DECADRON) injection 20 mg    This is for a two (2) level block. Use two (2) syringes and divide content in half.   ropivacaine (PF) 2 mg/mL (0.2%) (NAROPIN) injection 2 mL    This is for a two (2) level block. Use two (2) syringes and divide content in half.   sodium chloride flush (NS) 0.9 % injection 2 mL    This is for a  two (2) level block. Use two (2) syringes and divide content in half.   Medications administered: We administered iohexol, lidocaine, dexamethasone, ropivacaine (PF) 2 mg/mL (0.2%), and sodium chloride flush.  See the medical record for exact dosing, route, and time of administration.  Follow-up plan:   Return for Keep sch. appt.       Left L4, L5 transforaminal ESI 02/20/2023, 03/27/23    Recent Visits Date Type Provider Dept  03/07/23 Office Visit Edward Jolly, MD [facility]  02/20/23 Procedure visit Edward Jolly, MD [facility]  01/31/23 Office Visit Edward Jolly, MD [facility]  Showing recent visits within past 90 days and meeting all other requirements Today's Visits Date Type Provider Dept  03/27/23 Procedure visit Edward Jolly, MD [facility]  Showing today's visits and meeting all other requirements Future Appointments Date Type Provider Dept  06/18/23 Appointment Edward Jolly, MD [facility]  Showing future appointments within next 90 days and meeting all other requirements  Disposition: Discharge home  Discharge (Date  Time): 03/27/2023; 1145 hrs.   Primary Care Physician: Glori Luis, MD Location: [facility] Note by: Edward Jolly, MD (TTS technology used. I apologize for any typographical errors that were not detected and corrected.) Date: 03/27/2023; Time: 1:29 PM  Disclaimer:  Medicine is not an Visual merchandiser. The only guarantee in medicine is that nothing is guaranteed. It is important to note that the decision to proceed with this intervention was based on the information collected from the patient. The Data and conclusions were drawn from the patient's questionnaire, the interview, and the physical examination. Because the information was provided in large part by the patient, it cannot be guaranteed that it has not been purposely or unconsciously manipulated. Every effort  has been made to obtain as much relevant data as possible for this evaluation. It is important to note that the conclusions that lead to this procedure are derived in large part from the available data. Always take into account that the treatment will also be dependent on availability of  resources and existing treatment guidelines, considered by other Pain Management Practitioners as being common knowledge and practice, at the time of the intervention. For Medico-Legal purposes, it is also important to point out that variation in procedural techniques and pharmacological choices are the acceptable norm. The indications, contraindications, technique, and results of the above procedure should only be interpreted and judged by a Board-Certified Interventional Pain Specialist with extensive familiarity and expertise in the same exact procedure and technique.

## 2023-03-28 ENCOUNTER — Telehealth: Payer: Self-pay | Admitting: *Deleted

## 2023-03-28 ENCOUNTER — Other Ambulatory Visit: Payer: Self-pay

## 2023-03-28 MED ORDER — FREESTYLE LIBRE 14 DAY READER DEVI: 0 refills | Status: DC

## 2023-03-28 NOTE — Telephone Encounter (Signed)
No problems post procedure. 

## 2023-03-28 NOTE — Telephone Encounter (Signed)
Lvm informing pt reader was sent in to pharmacy

## 2023-03-28 NOTE — Telephone Encounter (Signed)
Patient states he is following up on his previous message.  Patient states his meter that reads the sensor for his blood sugar is dead and he would really like to get another one today when he goes to his pharmacy.  Patient states his preferred [facility] in [place].

## 2023-03-28 NOTE — Telephone Encounter (Signed)
Patient states he will be in town around 2pm today and he would like to pick it up at that time if possible.

## 2023-03-28 NOTE — Telephone Encounter (Signed)
Reader has been sent to pharmacy

## 2023-04-02 ENCOUNTER — Ambulatory Visit: Payer: PPO | Admitting: Urology

## 2023-04-02 ENCOUNTER — Encounter: Payer: Self-pay | Admitting: Urology

## 2023-04-02 VITALS — BP 136/70 | HR 100 | Ht 70.0 in | Wt 161.0 lb

## 2023-04-02 DIAGNOSIS — N3281 Overactive bladder: Secondary | ICD-10-CM

## 2023-04-02 DIAGNOSIS — R3915 Urgency of urination: Secondary | ICD-10-CM

## 2023-04-02 DIAGNOSIS — R350 Frequency of micturition: Secondary | ICD-10-CM

## 2023-04-02 DIAGNOSIS — R399 Unspecified symptoms and signs involving the genitourinary system: Secondary | ICD-10-CM

## 2023-04-02 DIAGNOSIS — Z125 Encounter for screening for malignant neoplasm of prostate: Secondary | ICD-10-CM | POA: Diagnosis not present

## 2023-04-02 LAB — BLADDER SCAN AMB NON-IMAGING

## 2023-04-02 MED ORDER — TROSPIUM CHLORIDE ER 60 MG PO CP24: 60.0000 mg | ORAL_CAPSULE | Freq: Every day | ORAL | 11 refills | Status: DC

## 2023-04-02 NOTE — Patient Instructions (Signed)
I think your symptoms are primarily related to overactive bladder.  I would recommend avoiding sodas, diet drinks, energy drinks, and tea, as these can all make your urinary symptoms worse.  I recommend drinking only water for 1 or 2 weeks, then slowly adding back in different beverages to see how your bladder tolerates them.  We also prescribed a medication called trospium to help with your urgency and frequency, this can take a few weeks to see improvement.  Side effects can include dry mouth or constipation.

## 2023-04-02 NOTE — Progress Notes (Signed)
   04/02/2023 11:36 AM   James Hood July 22, 1940 [id number]  Reason for visit: Follow up urinary symptoms, BPH/OAB, PSA screening  HPI: 82 year old male who I last saw in July 2022 for primarily overactive symptoms with frequency, urgency, urge incontinence, and nocturia.  He previously was on Flomax and finasteride by Dr. Sherryl Barters long-term.  CT showed a 24 g prostate, PVRs have been normal, including normal at 0ml today, and cystoscopy showed a small prostate with normal-appearing bladder.  He was previously trialed on beta 3 agonist including Myrbetriq and vibegron, he thinks these were helpful, but they were not affordable.  He also discontinued the finasteride in the setting of his small prostate.  PSA normal at 0.02 which has been stable over the last 10 years.  He reports worsening urinary symptoms of urgency and frequency.  Currently is only on Flomax.  He voids about every 45 minutes during the day, and has some incontinence overnight as well as gets up 1-2 times and requires a depends overnight.  He denies any gross hematuria or dysuria.  PVR today is normal at 0ml.  He continues to drink high volumes of soda, diet drinks, and energy drinks.  We had a long conversation today about behavioral strategies and avoiding bladder irritants, I think is issues primarily overactive bladder, and I recommended trying trospium 60 mg XL.  Need to be cautious with anticholinergics with his age, but beta 3 agonist have not been affordable.  RTC 3 months PVR and symptom check  Sondra Come, MD  [facility] [address], [address] [place], [place] [address] [phone]

## 2023-04-04 DIAGNOSIS — Z7984 Long term (current) use of oral hypoglycemic drugs: Secondary | ICD-10-CM | POA: Diagnosis not present

## 2023-04-04 DIAGNOSIS — F0390 Unspecified dementia without behavioral disturbance: Secondary | ICD-10-CM | POA: Diagnosis not present

## 2023-04-04 DIAGNOSIS — I10 Essential (primary) hypertension: Secondary | ICD-10-CM | POA: Diagnosis not present

## 2023-04-04 DIAGNOSIS — J449 Chronic obstructive pulmonary disease, unspecified: Secondary | ICD-10-CM | POA: Diagnosis not present

## 2023-04-04 DIAGNOSIS — N1831 Chronic kidney disease, stage 3a: Secondary | ICD-10-CM | POA: Diagnosis not present

## 2023-04-04 DIAGNOSIS — Z720 Tobacco use: Secondary | ICD-10-CM | POA: Diagnosis not present

## 2023-04-04 DIAGNOSIS — D692 Other nonthrombocytopenic purpura: Secondary | ICD-10-CM | POA: Diagnosis not present

## 2023-04-04 DIAGNOSIS — G8929 Other chronic pain: Secondary | ICD-10-CM | POA: Diagnosis not present

## 2023-04-04 DIAGNOSIS — M79605 Pain in left leg: Secondary | ICD-10-CM | POA: Diagnosis not present

## 2023-04-04 DIAGNOSIS — Z6823 Body mass index (BMI) 23.0-23.9, adult: Secondary | ICD-10-CM | POA: Diagnosis not present

## 2023-04-04 DIAGNOSIS — E1122 Type 2 diabetes mellitus with diabetic chronic kidney disease: Secondary | ICD-10-CM | POA: Diagnosis not present

## 2023-04-05 ENCOUNTER — Telehealth: Payer: Self-pay | Admitting: Family Medicine

## 2023-04-05 NOTE — Telephone Encounter (Signed)
Pt called stating he would like to go back on ozempic because tradjenta is too expensive

## 2023-04-09 ENCOUNTER — Other Ambulatory Visit: Payer: Self-pay | Admitting: Family Medicine

## 2023-04-09 DIAGNOSIS — G2581 Restless legs syndrome: Secondary | ICD-10-CM

## 2023-04-09 NOTE — Telephone Encounter (Signed)
Can you find out if the price of the Tradjenta just went up?  Given his prior degree of weight loss while on Ozempic I do not think the Ozempic is a good option for him.  If we are not able to get the Tradjenta to work out then we can consider something else.

## 2023-04-10 ENCOUNTER — Other Ambulatory Visit: Payer: Self-pay | Admitting: Family Medicine

## 2023-04-10 DIAGNOSIS — E1142 Type 2 diabetes mellitus with diabetic polyneuropathy: Secondary | ICD-10-CM

## 2023-04-10 MED ORDER — FREESTYLE LIBRE 2 READER DEVI: 0 refills | Status: DC

## 2023-04-10 MED ORDER — FREESTYLE LIBRE 2 SENSOR MISC: 3 refills | Status: DC

## 2023-04-10 NOTE — Telephone Encounter (Signed)
Pharmacists at [facility] states it was $47 but this time it is $125.67 and she believes he is in the donut hole.

## 2023-04-17 DIAGNOSIS — J209 Acute bronchitis, unspecified: Secondary | ICD-10-CM | POA: Diagnosis not present

## 2023-04-24 ENCOUNTER — Other Ambulatory Visit: Payer: Self-pay

## 2023-04-24 DIAGNOSIS — G2581 Restless legs syndrome: Secondary | ICD-10-CM | POA: Diagnosis not present

## 2023-04-24 DIAGNOSIS — G479 Sleep disorder, unspecified: Secondary | ICD-10-CM | POA: Diagnosis not present

## 2023-04-24 DIAGNOSIS — R413 Other amnesia: Secondary | ICD-10-CM | POA: Diagnosis not present

## 2023-04-24 DIAGNOSIS — R42 Dizziness and giddiness: Secondary | ICD-10-CM | POA: Diagnosis not present

## 2023-04-24 DIAGNOSIS — R4189 Other symptoms and signs involving cognitive functions and awareness: Secondary | ICD-10-CM | POA: Diagnosis not present

## 2023-04-24 DIAGNOSIS — M19072 Primary osteoarthritis, left ankle and foot: Secondary | ICD-10-CM

## 2023-04-24 DIAGNOSIS — G459 Transient cerebral ischemic attack, unspecified: Secondary | ICD-10-CM | POA: Diagnosis not present

## 2023-04-24 DIAGNOSIS — R262 Difficulty in walking, not elsewhere classified: Secondary | ICD-10-CM | POA: Diagnosis not present

## 2023-04-24 DIAGNOSIS — G8929 Other chronic pain: Secondary | ICD-10-CM

## 2023-04-24 DIAGNOSIS — M25572 Pain in left ankle and joints of left foot: Secondary | ICD-10-CM | POA: Diagnosis not present

## 2023-04-24 MED ORDER — DULOXETINE HCL 30 MG PO CPEP: 60.0000 mg | ORAL_CAPSULE | Freq: Every day | ORAL | 3 refills | Status: DC

## 2023-04-30 ENCOUNTER — Telehealth: Payer: Self-pay

## 2023-04-30 NOTE — Telephone Encounter (Signed)
PAP application for (Tradjenta [employer] company  has been mailed to pt home. I will fax PCP pages once I receive pt pages

## 2023-05-03 NOTE — Progress Notes (Unsigned)
BH MD/PA/NP OP Progress Note  05/03/2023 3:16 PM James Hood  MRN:  [id number]  Chief Complaint: No chief complaint on file.  HPI: *** According to the chart review, the following events have occurred since the last visit: The patient underwent cataract surgery.   Visit Diagnosis: No diagnosis found.  Past Psychiatric History: Please see initial evaluation for full details. I have reviewed the history. No updates at this time.     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   Colon polyps    Complication of anesthesia    Woke during first cataract procedure   Dementia (HCC)    Depression    Diabetes mellitus type 2 in nonobese (HCC)    GERD (gastroesophageal reflux disease)    BARRETTS ESOPHAGUS RESOLVED PER PATIENT   Headache    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HCC)    History of hiatal hernia    Hypercholesteremia    Hypertension    Hyperthyroidism    Neuropathic pain    Primary localized osteoarthritis of right knee    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   Stomach ulcer    Tremor, essential    Wears dentures    full upper   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/02/2018    Past Surgical History:  Procedure Laterality Date   ANKLE ARTHROSCOPY Left 09/29/2019   Procedure: LEFT ANKLE ARTHROSCOPY, DEBRIDEMENT;  Surgeon: Nadara Mustard, MD;  Location: [facility];  Service: Orthopedics;  Laterality: Left;   BACK SURGERY     BIOPSY  01/18/2023   Procedure: BIOPSY;  Surgeon: [name], DO;  Location: [facility] END[facility]COPY;  Service: Gastroenterology;;   CATARACT EXTRACTION W/PHACO Right 03/12/2023   Procedure: CATARACT EXTRACTION PHACO AND INTRAOCULAR LENS PLACEMENT (IOC) RIGHT DIABETIC 11.25 01:07.6;  Surgeon: Galen Manila, MD;  Location: [facility];  Service: Ophthalmology;  Laterality: Right;   CATARACT EXTRACTION W/PHACO Left 03/25/2023   Procedure: CATARACT EXTRACTION PHACO AND INTRAOCULAR LENS PLACEMENT (IOC) LEFT DIABETIC 7.15 00:51.0;  Surgeon: Galen Manila, MD;  Location: [facility];  Service: Ophthalmology;  Laterality: Left;   COLONOSCOPY WITH PROPOFOL N/A 09/14/2016   Procedure: COLONOSCOPY WITH PROPOFOL;  Surgeon: Scot Jun, MD;  Location: [facility] ENDOSCOPY;  Service: Endoscopy;  Laterality: N/A;   esophageal stretch     ESOPHAGOGASTRODUODENOSCOPY (EGD) WITH PROPOFOL N/A 12/13/2020   Procedure: ESOPHAGOGASTRODUODENOSCOPY (EGD) WITH PROPOFOL;  Surgeon: Regis Bill, MD;  Location: [facility] ENDOSCOPY;  Service: Endoscopy;  Laterality: N/A;  IDDM   ESOPHAGOGASTRODUODENOSCOPY (EGD) WITH PROPOFOL N/A 01/18/2023   Procedure: ESOPHAGOGASTRODUODENOSCOPY (EGD) WITH PROPOFOL;  Surgeon: [name], DO;  Location: [facility] ENDOSCOPY;  Service: Gastroenterology;  Laterality: N/A;   JOINT REPLACEMENT Right 2016   knee   LUMBAR LAMINECTOMY/DECOMPRESSION MICRODISCECTOMY Left 02/03/2016   Procedure: LEFT L5-S1 DISKECTOMY;  Surgeon: Hilda Lias, MD;  Location: [facility];  Service: Neurosurgery;  Laterality: Left;  LEFT L5-S1 DISKECTOMY   MALONEY DILATION  01/18/2023   Procedure: MALONEY DILATION;  Surgeon: [name], DO;  Location: [facility] ENDOSCOPY;  Service: Gastroenterology;;   PULSE GENERATOR IMPLANT N/A 08/21/2017   Procedure: UNILATERAL PULSE GENERATOR IMPLANT;  Surgeon: Venetia Night, MD;  Location: [facility] ORS;  Service: Neurosurgery;  Laterality: N/A;   PULSE GENERATOR IMPLANT Right 04/02/2018  Procedure: REMOVAL OF PULSE GENERATOR IMPLANT AND LEADS;  Surgeon: Venetia Night, MD;  Location: [facility] ORS;  Service: Neurosurgery;  Laterality: Right;   TONSILLECTOMY     TOTAL KNEE ARTHROPLASTY Right 11/15/2014   Procedure: TOTAL KNEE ARTHROPLASTY;  Surgeon: Salvatore Marvel, MD;  Location: [facility] OR;   Service: Orthopedics;  Laterality: Right;    Family Psychiatric History: Please see initial evaluation for full details. I have reviewed the history. No updates at this time.     Family History:  Family History  Problem Relation Age of Onset   Heart attack Mother    Heart disease Mother    Hypertension Father    Depression Father    Kidney cancer Neg Hx    Prostate cancer Neg Hx     Social History:  Social History   Socioeconomic History   Marital status: Married    Spouse name: Not on file   Number of children: Not on file   Years of education: Not on file   Highest education level: Master's degree (e.g., MA, MS, MEng, MEd, MSW, MBA)  Occupational History   Not on file  Tobacco Use   Smoking status: Every Day    Current packs/day: 0.50    Average packs/day: 0.5 packs/day for 67.9 years (34.0 ttl pk-yrs)    Types: Cigarettes    Start date: 10    Passive exposure: Current   Smokeless tobacco: Never  Vaping Use   Vaping status: Former  Substance and Sexual Activity   Alcohol use: Not Currently    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   Drug use: Never   Sexual activity: Not Currently  Other Topics Concern   Not on file  Social History Narrative   Not on file   Social Drivers of Health   Financial Resource Strain: Medium Risk (03/15/2023)   Overall Financial Resource Strain (CARDIA)    Difficulty of Paying Living Expenses: Somewhat hard  Food Insecurity: No Food Insecurity (03/15/2023)   Hunger Vital Sign    Worried About Running Out of Food in the Last Year: Never true    Ran Out of Food in the Last Year: Never true  Transportation Needs: No Transportation Needs (03/15/2023)   PRAPARE - Administrator, Civil Service (Medical): No    Lack of Transportation (Non-Medical): No  Physical Activity: Inactive (03/15/2023)   Exercise Vital Sign    Days of Exercise per Week: 0 days    Minutes of Exercise per Session: 0 min  Stress: No Stress  Concern Present (03/15/2023)   [employer] of Occupational Health - Occupational Stress Questionnaire    Feeling of Stress : Only a little  Social Connections: Socially Integrated (03/15/2023)   Social Connection and Isolation Panel [NHANES]    Frequency of Communication with Friends and Family: More than three times a week    Frequency of Social Gatherings with Friends and Family: More than three times a week    Attends Religious Services: More than 4 times per year    Active Member of Clubs or Organizations: No    Attends Engineer, structural: More than 4 times per year    Marital Status: Married    Allergies: No Known Allergies  Metabolic Disorder Labs: Lab Results  Component Value Date   HGBA1C 7.3 (A) 03/19/2023   MPG 131 01/25/2016   MPG 166 11/05/2014   No results found for: "PROLACTIN" Lab Results  Component Value Date   CHOL 119 08/27/2022  TRIG 83.0 08/27/2022   HDL 65.70 08/27/2022   CHOLHDL 2 08/27/2022   VLDL 16.6 08/27/2022   LDLCALC 37 08/27/2022   LDLCALC 57 09/25/2017   Lab Results  Component Value Date   TSH 0.59 01/16/2023   TSH 1.93 12/04/2022    Therapeutic Level Labs: No results found for: "LITHIUM" No results found for: "VALPROATE" No results found for: "CBMZ"  Current Medications: Current Outpatient Medications  Medication Sig Dispense Refill   aspirin EC 81 MG tablet Take 81 mg by mouth daily.     cholestyramine (QUESTRAN) 4 g packet Take 1 packet by mouth 2 (two) times daily.     Continuous Glucose Receiver (FREESTYLE LIBRE 2 READER) DEVI Use to check glucose at least every 8 hours 1 each 0   Continuous Glucose Sensor (FREESTYLE LIBRE 2 SENSOR) MISC Apply every 14 days to check glucose 4 each 3   cyclobenzaprine (FLEXERIL) 10 MG tablet Take 1 tablet (10 mg total) by mouth 3 (three) times daily as needed for muscle spasms. 30 tablet 0   dapagliflozin propanediol (FARXIGA) 10 MG TABS tablet Take 10 mg by mouth daily.      diphenoxylate-atropine (LOMOTIL) 2.5-0.025 MG tablet Take by mouth.     divalproex (DEPAKOTE ER) 500 MG 24 hr tablet Take 1 tablet by mouth 2 (two) times daily.     donepezil (ARICEPT) 10 MG tablet Take 10 mg by mouth at bedtime.     DULoxetine (CYMBALTA) 30 MG capsule Take 2 capsules (60 mg total) by mouth daily. 60 capsule 3   fenofibrate (TRICOR) 145 MG tablet TAKE 1 TABLET BY MOUTH DAILY 90 tablet 1   gabapentin (NEURONTIN) 600 MG tablet TAKE TWO TABLETS BY MOUTH THREE TIMES A DAY 180 tablet 1   lidocaine (LIDODERM) 5 % Place 1 patch onto the skin every 12 (twelve) hours. Remove & Discard patch within 12 hours or as directed by MD 30 patch 2   linagliptin (TRADJENTA) 5 MG TABS tablet Take 1 tablet (5 mg total) by mouth daily. 30 tablet 5   lisinopril-hydrochlorothiazide (ZESTORETIC) 20-25 MG tablet TAKE 1 TABLET BY MOUTH 2 TIMES A DAY 180 tablet 1   memantine (NAMENDA) 10 MG tablet Take 1 tablet by mouth daily.     metFORMIN (GLUCOPHAGE-XR) 500 MG 24 hr tablet TAKE 1 TABLET BY MOUTH TWICE A DAY 180 tablet 1   metoprolol succinate (TOPROL-XL) 25 MG 24 hr tablet Take 25 mg by mouth daily.     Multiple Vitamins-Minerals (CENTRUM SILVER PO) Take 1 tablet by mouth daily.     omega-3 acid ethyl esters (LOVAZA) 1 g capsule TAKE 2 CAPSULES BY MOUTH TWICE A DAY 360 capsule 3   omeprazole (PRILOSEC) 20 MG capsule Take 20 mg by mouth daily.     oxyCODONE-acetaminophen (PERCOCET) 10-325 MG tablet Take 1 tablet by mouth every 6 (six) hours as needed for pain. 120 tablet 0   [START ON 05/27/2023] oxyCODONE-acetaminophen (PERCOCET) 10-325 MG tablet Take 1 tablet by mouth every 6 (six) hours as needed for pain. 120 tablet 0   pantoprazole (PROTONIX) 40 MG tablet Take 40 mg by mouth daily.     pramipexole (MIRAPEX) 0.125 MG tablet TAKE 1 TABLET BY MOUTH EVERY NIGHT 2-3 HOURS BEFORE BEDTIME 90 tablet 1   primidone (MYSOLINE) 50 MG tablet Take 150-250 mg by mouth 2 (two) times daily. Take 250 mg by mouth in the  morning & take 150 mg at night.     QUEtiapine (SEROQUEL) 25 MG tablet  Take 75 mg by mouth at bedtime.     rosuvastatin (CRESTOR) 40 MG tablet TAKE ONE TABLET BY MOUTH DAILY 90 tablet 3   tamsulosin (FLOMAX) 0.4 MG CAPS capsule TAKE 2 CAPSULES BY MOUTH DAILY AFTER SUPPER 180 capsule 0   traZODone (DESYREL) 100 MG tablet Take 2 tablets (200 mg total) by mouth at bedtime as needed for sleep. 180 tablet 1   Trospium Chloride 60 MG CP24 Take 1 capsule (60 mg total) by mouth daily. 30 capsule 11   vitamin C (ASCORBIC ACID) 500 MG tablet Take 1,000 mg by mouth daily.     No current facility-administered medications for this visit.     Musculoskeletal: Strength & Muscle Tone: within normal limits Gait & Station: normal Patient leans: N/A  Psychiatric Specialty Exam: Review of Systems  There were no vitals taken for this visit.There is no height or weight on file to calculate BMI.  General Appearance: {Appearance:22683}  Eye Contact:  {BHH EYE CONTACT:22684}  Speech:  Clear and Coherent  Volume:  Normal  Mood:  {BHH MOOD:22306}  Affect:  {Affect (PAA):22687}  Thought Process:  Coherent  Orientation:  Full (Time, Place, and Person)  Thought Content: Logical   Suicidal Thoughts:  {ST/HT (PAA):22692}  Homicidal Thoughts:  {ST/HT (PAA):22692}  Memory:  Immediate;   Good  Judgement:  {Judgement (PAA):22694}  Insight:  {Insight (PAA):22695}  Psychomotor Activity:  Normal  Concentration:  Concentration: Good and Attention Span: Good  Recall:  Good  Fund of Knowledge: Good  Language: Good  Akathisia:  No  Handed:  Right  AIMS (if indicated): not done  Assets:  Communication Skills Desire for Improvement  ADL's:  Intact  Cognition: WNL  Sleep:  {BHH GOOD/FAIR/POOR:22877}   Screenings: GAD-7    Flowsheet Row Office Visit from 03/19/2023 in [facility] [employer] Visit from 02/27/2023 in [facility] [employer]  Visit from 01/16/2023 in [facility] [employer] Visit from 12/04/2022 in [facility] [employer] Visit from 10/24/2022 in [facility] [employer]  Total GAD-7 Score 0 2 5 2 5       Mini-Mental    Flowsheet Row Clinical Support from 11/15/2017 in [facility] [employer] [facility] Clinical Support from 11/14/2016 in [facility] [employer]  Total Score (max 30 points ) 30 30      PHQ2-9    Flowsheet Row Procedure visit from 03/27/2023 in [facility] Visit from 03/19/2023 in [facility] [employer] Visit from 02/27/2023 in [facility] Procedure visit from 02/20/2023 in [facility] Visit from 01/16/2023 in [facility] Total Score 0 0 0 0 1  PHQ-9 Total Score -- 0 2 -- 5      Flowsheet Row Admission (Discharged) from 03/25/2023 in [facility] PERIOP Admission (Discharged) from 03/12/2023 in [facility]OP Admission (Discharged) from 01/18/2023 in [facility]  C-SSRS RISK CATEGORY No Risk No Risk No Risk        Assessment and Plan:  James Hood is a 82 y.o. year old male with a history of  pseudo dementia, sleep disorder, tremor, history of QTc prolongation , who presents for  follow up appointment for below.    1. MDD (major depressive disorder), recurrent, in partial remission (HCC) Acute stressors include: ankle pain, moving into a camper due to financial strain  Other stressors include: conflict with his step son, financial strain    History:   There has been a steady improvement in depressive symptoms,  anxiety since the last visit.  In retrospect, he was likely experiencing mood symptoms due to issues with adjustment.  However, he feels comfortable to stay on the current dose of duloxetine at this time.  Will continue duloxetine along with bupropion to target depression.  Noted that he is on quetiapine, gabapentin, and Depakote, which have been prescribed by another provider.  To avoid polypharmacy, will consider working on tapering off duloxetine in the future if his mood continues to be stable.    2. Insomnia, unspecified type Stable.  Will continue trazodone as needed for insomnia.  He is aware of his potential risk of drowsiness and fall especially with concomitant use of quetiapine, gabapentin and opioid.     # Alcohol use disorder in sustained remission  History:  He began drinking in college but has been abstinent since the age of 29. He actively participates in [employer]. Although he reports occasional craving, he has been abstinent from alcohol.  Will continue motivational interview.    # History of cognitive impairment Followed-up by neurologist, and has been on memantine and donepezil. 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 Acute risk factors for suicide include: N/A. Protective factors for this patient include: positive social support, responsibility to others  (children, family), coping skills and hope for the future. Considering these factors, the overall suicide risk at this point appears to be low. Patient is appropriate for outpatient follow up.  Collaboration of Care: Collaboration of Care: {BH OP Collaboration of Care:21014065}  Patient/Guardian was advised Release of Information must be obtained prior to any record release in order to collaborate their care with an outside provider. Patient/Guardian was advised if they have not already done so to contact the registration department to sign all necessary forms in order for [place] to release information regarding their care.   Consent: Patient/Guardian gives verbal consent for treatment and assignment of benefits for services provided during this visit. Patient/Guardian expressed understanding and agreed to proceed.    [name], MD 05/03/2023, 3:16 PM

## 2023-05-06 ENCOUNTER — Encounter: Payer: Self-pay | Admitting: Family Medicine

## 2023-05-06 ENCOUNTER — Ambulatory Visit: Payer: PPO | Admitting: Psychiatry

## 2023-05-06 ENCOUNTER — Ambulatory Visit (INDEPENDENT_AMBULATORY_CARE_PROVIDER_SITE_OTHER): Payer: PPO | Admitting: Family Medicine

## 2023-05-06 VITALS — BP 126/76 | HR 95 | Temp 98.4°F | Ht 70.0 in | Wt 160.8 lb

## 2023-05-06 DIAGNOSIS — B370 Candidal stomatitis: Secondary | ICD-10-CM | POA: Diagnosis not present

## 2023-05-06 DIAGNOSIS — F419 Anxiety disorder, unspecified: Secondary | ICD-10-CM

## 2023-05-06 DIAGNOSIS — F32A Depression, unspecified: Secondary | ICD-10-CM

## 2023-05-06 MED ORDER — NYSTATIN 100000 UNIT/ML MT SUSP: 5.0000 mL | Freq: Four times a day (QID) | OROMUCOSAL | 0 refills | Status: DC

## 2023-05-06 NOTE — Progress Notes (Signed)
[name], MD Phone: [phone]  James Hood is a 82 y.o. male who presents today for same-day visit.  Thrush: Patient notes a week or so ago he mentioned his posterior palate seems to be sore to his wife.  She looked and told him he had thrush.  He used an over-the-counter swish that see him to to help a little bit.  He was on antibiotics 3 to 4 weeks ago.  He has false teeth and wears them for multiple days without taking the mouth.  He notes no recent prednisone.  Anxiety/depression: Patient notes he is getting the point where he is regretting things that have happened in the past.  He is also having lots of issues since they moved into the camper and the camper has caused them lots of money.  He notes no SI.  He does still see psychiatry.  Social History   Tobacco Use  Smoking Status Every Day   Current packs/day: 0.50   Average packs/day: 0.5 packs/day for 68.0 years (34.0 ttl pk-yrs)   Types: Cigarettes   Start date: 51   Passive exposure: Current  Smokeless Tobacco Never    Current Outpatient Medications on File Prior to Visit  Medication Sig Dispense Refill   aspirin EC 81 MG tablet Take 81 mg by mouth daily.     cholestyramine (QUESTRAN) 4 g packet Take 1 packet by mouth 2 (two) times daily.     Continuous Glucose Receiver (FREESTYLE LIBRE 2 READER) DEVI Use to check glucose at least every 8 hours 1 each 0   Continuous Glucose Sensor (FREESTYLE LIBRE 2 SENSOR) MISC Apply every 14 days to check glucose 4 each 3   cyclobenzaprine (FLEXERIL) 10 MG tablet Take 1 tablet (10 mg total) by mouth 3 (three) times daily as needed for muscle spasms. 30 tablet 0   dapagliflozin propanediol (FARXIGA) 10 MG TABS tablet Take 10 mg by mouth daily.     diphenoxylate-atropine (LOMOTIL) 2.5-0.025 MG tablet Take by mouth.     divalproex (DEPAKOTE ER) 500 MG 24 hr tablet Take 1 tablet by mouth 2 (two) times daily.     donepezil (ARICEPT) 10 MG tablet Take 10 mg by mouth at  bedtime.     DULoxetine (CYMBALTA) 30 MG capsule Take 2 capsules (60 mg total) by mouth daily. 60 capsule 3   fenofibrate (TRICOR) 145 MG tablet TAKE 1 TABLET BY MOUTH DAILY 90 tablet 1   gabapentin (NEURONTIN) 600 MG tablet TAKE TWO TABLETS BY MOUTH THREE TIMES A DAY 180 tablet 1   lidocaine (LIDODERM) 5 % Place 1 patch onto the skin every 12 (twelve) hours. Remove & Discard patch within 12 hours or as directed by MD 30 patch 2   linagliptin (TRADJENTA) 5 MG TABS tablet Take 1 tablet (5 mg total) by mouth daily. 30 tablet 5   lisinopril-hydrochlorothiazide (ZESTORETIC) 20-25 MG tablet TAKE 1 TABLET BY MOUTH 2 TIMES A DAY 180 tablet 1   memantine (NAMENDA) 10 MG tablet Take 1 tablet by mouth daily.     metFORMIN (GLUCOPHAGE-XR) 500 MG 24 hr tablet TAKE 1 TABLET BY MOUTH TWICE A DAY 180 tablet 1   metoprolol succinate (TOPROL-XL) 25 MG 24 hr tablet Take 25 mg by mouth daily.     Multiple Vitamins-Minerals (CENTRUM SILVER PO) Take 1 tablet by mouth daily.     omega-3 acid ethyl esters (LOVAZA) 1 g capsule TAKE 2 CAPSULES BY MOUTH TWICE A DAY 360 capsule 3   omeprazole (PRILOSEC) 20  MG capsule Take 20 mg by mouth daily.     oxyCODONE-acetaminophen (PERCOCET) 10-325 MG tablet Take 1 tablet by mouth every 6 (six) hours as needed for pain. 120 tablet 0   [START ON 05/27/2023] oxyCODONE-acetaminophen (PERCOCET) 10-325 MG tablet Take 1 tablet by mouth every 6 (six) hours as needed for pain. 120 tablet 0   pantoprazole (PROTONIX) 40 MG tablet Take 40 mg by mouth daily.     pramipexole (MIRAPEX) 0.125 MG tablet TAKE 1 TABLET BY MOUTH EVERY NIGHT 2-3 HOURS BEFORE BEDTIME 90 tablet 1   primidone (MYSOLINE) 50 MG tablet Take 150-250 mg by mouth 2 (two) times daily. Take 250 mg by mouth in the morning & take 150 mg at night.     QUEtiapine (SEROQUEL) 25 MG tablet Take 75 mg by mouth at bedtime.     rosuvastatin (CRESTOR) 40 MG tablet TAKE ONE TABLET BY MOUTH DAILY 90 tablet 3   tamsulosin (FLOMAX) 0.4 MG CAPS  capsule TAKE 2 CAPSULES BY MOUTH DAILY AFTER SUPPER 180 capsule 0   traZODone (DESYREL) 100 MG tablet Take 2 tablets (200 mg total) by mouth at bedtime as needed for sleep. 180 tablet 1   Trospium Chloride 60 MG CP24 Take 1 capsule (60 mg total) by mouth daily. 30 capsule 11   vitamin C (ASCORBIC ACID) 500 MG tablet Take 1,000 mg by mouth daily.     No current facility-administered medications on file prior to visit.     ROS see history of present illness  Objective  Physical Exam Vitals:   05/06/23 1412  BP: 126/76  Pulse: 95  Temp: 98.4 F (36.9 C)  SpO2: 96%    BP Readings from Last 3 Encounters:  05/06/23 126/76  04/02/23 136/70  03/27/23 (!) 133/99   Wt Readings from Last 3 Encounters:  05/06/23 160 lb 12.8 oz (72.9 kg)  04/02/23 161 lb (73 kg)  03/27/23 163 lb (73.9 kg)    Physical Exam HENT:     Mouth/Throat:     Comments: Tongue appears slightly white, no abnormality of the posterior palate, false teeth in place     Assessment/Plan: Please see individual problem list.  Thrush Assessment & Plan: Patient with possible thrush based on history.  We will treat with nystatin swish and swallow.  If not improving he will let us know.  I advised that he needs to take his false teeth out daily and clean them.  Orders: -     Nystatin; Take 5 mLs (500,000 Units total) by mouth 4 (four) times daily. Swish in the mouth and retain for as long as possible (several minutes) before swallowing. Use for 7-14 days.  Dispense: 120 mL; Refill: 0  Anxiety and depression Assessment & Plan: Chronic issue.  Worsened recently.  Patient will continue to see a psychiatrist.  I encouraged him to do something that he enjoys to help with his mood.  He will remain on Cymbalta 60 mg daily.  Other medications managed by psychiatrist.      Return if symptoms worsen or fail to improve.   [name], MD [facility]

## 2023-05-06 NOTE — Assessment & Plan Note (Signed)
Patient with possible thrush based on history.  We will treat with nystatin swish and swallow.  If not improving he will let us know.  I advised that he needs to take his false teeth out daily and clean them.

## 2023-05-06 NOTE — Assessment & Plan Note (Signed)
Chronic issue.  Worsened recently.  Patient will continue to see a psychiatrist.  I encouraged him to do something that he enjoys to help with his mood.  He will remain on Cymbalta 60 mg daily.  Other medications managed by psychiatrist.

## 2023-05-08 DIAGNOSIS — R413 Other amnesia: Secondary | ICD-10-CM | POA: Diagnosis not present

## 2023-05-31 ENCOUNTER — Telehealth: Payer: Self-pay

## 2023-05-31 NOTE — Telephone Encounter (Signed)
 Left HIPAA compliant voice mail for PAP on Tradjenta.

## 2023-06-03 ENCOUNTER — Other Ambulatory Visit: Payer: Self-pay | Admitting: Family Medicine

## 2023-06-08 ENCOUNTER — Other Ambulatory Visit: Payer: Self-pay | Admitting: Family Medicine

## 2023-06-08 DIAGNOSIS — E782 Mixed hyperlipidemia: Secondary | ICD-10-CM

## 2023-06-18 ENCOUNTER — Encounter: Payer: Self-pay | Admitting: Student in an Organized Health Care Education/Training Program

## 2023-06-18 ENCOUNTER — Ambulatory Visit
Payer: PPO | Attending: Student in an Organized Health Care Education/Training Program | Admitting: Student in an Organized Health Care Education/Training Program

## 2023-06-18 VITALS — BP 142/68 | HR 71 | Temp 98.3°F | Resp 18 | Ht 70.0 in | Wt 165.0 lb

## 2023-06-18 DIAGNOSIS — M19072 Primary osteoarthritis, left ankle and foot: Secondary | ICD-10-CM | POA: Diagnosis not present

## 2023-06-18 DIAGNOSIS — M5417 Radiculopathy, lumbosacral region: Secondary | ICD-10-CM

## 2023-06-18 DIAGNOSIS — M5386 Other specified dorsopathies, lumbar region: Secondary | ICD-10-CM

## 2023-06-18 DIAGNOSIS — M48061 Spinal stenosis, lumbar region without neurogenic claudication: Secondary | ICD-10-CM

## 2023-06-18 DIAGNOSIS — M25572 Pain in left ankle and joints of left foot: Secondary | ICD-10-CM

## 2023-06-18 DIAGNOSIS — G894 Chronic pain syndrome: Secondary | ICD-10-CM | POA: Diagnosis not present

## 2023-06-18 DIAGNOSIS — G8929 Other chronic pain: Secondary | ICD-10-CM | POA: Insufficient documentation

## 2023-06-18 DIAGNOSIS — M47816 Spondylosis without myelopathy or radiculopathy, lumbar region: Secondary | ICD-10-CM | POA: Diagnosis not present

## 2023-06-18 MED ORDER — OXYCODONE-ACETAMINOPHEN 10-325 MG PO TABS: 1.0000 | ORAL_TABLET | Freq: Four times a day (QID) | ORAL | 0 refills | Status: AC | PRN

## 2023-06-18 MED ORDER — OXYCODONE-ACETAMINOPHEN 10-325 MG PO TABS
1.0000 | ORAL_TABLET | Freq: Four times a day (QID) | ORAL | 0 refills | Status: DC | PRN
Start: 2023-09-01 — End: 2023-09-23

## 2023-06-18 NOTE — Progress Notes (Signed)
Nursing Pain Medication Assessment:  Safety precautions to be maintained throughout the outpatient stay will include: orient to surroundings, keep bed in low position, maintain call bell within reach at all times, provide assistance with transfer out of bed and ambulation.  Medication Inspection Compliance: Pill count conducted under aseptic conditions, in front of the patient. Neither the pills nor the bottle was removed from the patient's sight at any time. Once count was completed pills were immediately returned to the patient in their original bottle.  Medication: Oxycodone/APAP Pill/Patch Count:  72 of 120 pills remain Pill/Patch Appearance: Markings consistent with prescribed medication Bottle Appearance: Standard pharmacy container. Clearly labeled. Filled Date: 01 / 13 / 2025 Last Medication intake:  Today

## 2023-06-18 NOTE — Patient Instructions (Addendum)
Qutenza consideration  Procedure instructions  Do not eat or drink fluids (other than water) for 6 hours before your procedure  No water for 2 hours before your procedure  Take your blood pressure medicine with a sip of water  Arrive 30 minutes before your appointment  Carefully read the "Preparing for your procedure" detailed instructions  If you have questions call us at [phone]  _____________________________________________________________________    ______________________________________________________________________  Preparing for your procedure  Appointments: If you think you may not be able to keep your appointment, call 24-48 hours in advance to cancel. We need time to make it available to others.  During your procedure appointment there will be: No Prescription Refills. No disability issues to discussed. No medication changes or discussions.  Instructions: Food intake: Avoid eating anything solid for at least 8 hours prior to your procedure. Clear liquid intake: You may take clear liquids such as water up to 2 hours prior to your procedure. (No carbonated drinks. No soda.) Transportation: Unless otherwise stated by your physician, bring a driver. Morning Medicines: Except for blood thinners, take all of your other morning medications with a sip of water. Make sure to take your heart and blood pressure medicines. If your blood pressure's lower number is above 100, the case will be rescheduled. Blood thinners: Make sure to stop your blood thinners as instructed.  If you take a blood thinner, but were not instructed to stop it, call our office [phone] and ask to talk to a nurse. Not stopping a blood thinner prior to certain procedures could lead to serious complications. Diabetics on insulin: Notify the staff so that you can be scheduled 1st case in the morning. If your diabetes requires high dose insulin, take only  of your normal insulin dose the morning  of the procedure and notify the staff that you have done so. Preventing infections: Shower with an antibacterial soap the morning of your procedure.  Build-up your immune system: Take 1000 mg of Vitamin C with every meal (3 times a day) the day prior to your procedure. Antibiotics: Inform the nursing staff if you are taking any antibiotics or if you have any conditions that may require antibiotics prior to procedures. (Example: recent joint implants)   Pregnancy: If you are pregnant make sure to notify the nursing staff. Not doing so may result in injury to the fetus, including death.  Sickness: If you have a cold, fever, or any active infections, call and cancel or reschedule your procedure. Receiving steroids while having an infection may result in complications. Arrival: You must be in the facility at least 30 minutes prior to your scheduled procedure. Tardiness: Your scheduled time is also the cutoff time. If you do not arrive at least 15 minutes prior to your procedure, you will be rescheduled.  Children: Do not bring any children with you. Make arrangements to keep them home. Dress appropriately: There is always a possibility that your clothing may get soiled. Avoid long dresses. Valuables: Do not bring any jewelry or valuables.  Reasons to call and reschedule or cancel your procedure: (Following these recommendations will minimize the risk of a serious complication.) Surgeries: Avoid having procedures within 2 weeks of any surgery. (Avoid for 2 weeks before or after any surgery). Flu Shots: Avoid having procedures within 2 weeks of a flu shots or . (Avoid for 2 weeks before or after immunizations). Barium: Avoid having a procedure within 7-10 days after having had a radiological study involving the use  of radiological contrast. (Myelograms, Barium swallow or enema study). Heart attacks: Avoid any elective procedures or surgeries for the initial 6 months after a "Myocardial Infarction" (Heart  Attack). Blood thinners: It is imperative that you stop these medications before procedures. Let us know if you if you take any blood thinner.  Infection: Avoid procedures during or within two weeks of an infection (including chest colds or gastrointestinal problems). Symptoms associated with infections include: Localized redness, fever, chills, night sweats or profuse sweating, burning sensation when voiding, cough, congestion, stuffiness, runny nose, sore throat, diarrhea, nausea, vomiting, cold or Flu symptoms, recent or current infections. It is specially important if the infection is over the area that we intend to treat. Heart and lung problems: Symptoms that may suggest an active cardiopulmonary problem include: cough, chest pain, breathing difficulties or shortness of breath, dizziness, ankle swelling, uncontrolled high or unusually low blood pressure, and/or palpitations. If you are experiencing any of these symptoms, cancel your procedure and contact your primary care physician for an evaluation.  Remember:  Regular Business hours are:  Monday to Thursday 8:00 AM to 4:00 PM  Provider's Schedule: Delano Metz, MD:  Procedure days: Tuesday and Thursday 7:30 AM to 4:00 PM  Edward Jolly, MD:  Procedure days: Monday and Wednesday 7:30 AM to 4:00 PM

## 2023-06-18 NOTE — Progress Notes (Deleted)
 [facility] MD/PA/NP OP Progress Note  06/18/2023 4:31 PM James Hood  MRN:  [id number]  Chief Complaint: No chief complaint on file.  HPI: ***  - he was seen last in Sept 2024   Worsening in depression   Visit Diagnosis: No diagnosis found.  Past Psychiatric History: Please see initial evaluation for full details. I have reviewed the history. No updates at this time.     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   Colon polyps    Complication of anesthesia    Woke during first cataract procedure   Dementia (HCC)    Depression    Diabetes mellitus type 2 in nonobese (HCC)    GERD (gastroesophageal reflux disease)    BARRETTS ESOPHAGUS RESOLVED PER PATIENT   Headache    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HCC)    History of hiatal hernia    Hypercholesteremia    Hypertension    Hyperthyroidism    Neuropathic pain    Primary localized osteoarthritis of right knee    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   Stomach ulcer    Tremor, essential    Wears dentures    full upper   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/02/2018    Past Surgical History:  Procedure Laterality Date   ANKLE ARTHROSCOPY Left 09/29/2019   Procedure: LEFT ANKLE ARTHROSCOPY, DEBRIDEMENT;  Surgeon: Harden Jerona GAILS, MD;  Location: [facility];  Service: Orthopedics;  Laterality: Left;   BACK SURGERY     BIOPSY  01/18/2023   Procedure: BIOPSY;  Surgeon: Onita Elspeth Sharper, DO;  Location: [facility] END[facility]COPY;  Service: Gastroenterology;;   CATARACT EXTRACTION W/PHACO Right 03/12/2023   Procedure: CATARACT EXTRACTION PHACO AND INTRAOCULAR LENS PLACEMENT (IOC) RIGHT DIABETIC 11.25 01:07.6;  Surgeon: Jaye Fallow, MD;  Location: [facility];  Service: Ophthalmology;  Laterality: Right;   CATARACT  EXTRACTION W/PHACO Left 03/25/2023   Procedure: CATARACT EXTRACTION PHACO AND INTRAOCULAR LENS PLACEMENT (IOC) LEFT DIABETIC 7.15 00:51.0;  Surgeon: Jaye Fallow, MD;  Location: [facility];  Service: Ophthalmology;  Laterality: Left;   COLONOSCOPY WITH PROPOFOL  N/A 09/14/2016   Procedure: COLONOSCOPY WITH PROPOFOL ;  Surgeon: Lamar ONEIDA Holmes, MD;  Location: [facility] ENDOSCOPY;  Service: Endoscopy;  Laterality: N/A;   esophageal stretch     ESOPHAGOGASTRODUODENOSCOPY (EGD) WITH PROPOFOL  N/A 12/13/2020   Procedure: ESOPHAGOGASTRODUODENOSCOPY (EGD) WITH PROPOFOL ;  Surgeon: Maryruth Ole ONEIDA, MD;  Location: [facility] ENDOSCOPY;  Service: Endoscopy;  Laterality: N/A;  IDDM   ESOPHAGOGASTRODUODENOSCOPY (EGD) WITH PROPOFOL  N/A 01/18/2023   Procedure: ESOPHAGOGASTRODUODENOSCOPY (EGD) WITH PROPOFOL ;  Surgeon: Onita Elspeth Sharper, DO;  Location: [facility] ENDOSCOPY;  Service: Gastroenterology;  Laterality: N/A;   JOINT REPLACEMENT Right 2016   knee   LUMBAR LAMINECTOMY/DECOMPRESSION MICRODISCECTOMY Left 02/03/2016   Procedure: LEFT L5-S1 DISKECTOMY;  Surgeon: Catalina Stains, MD;  Location: [facility]S;  Service: Neurosurgery;  Laterality: Left;  LEFT L5-S1 DISKECTOMY   MALONEY DILATION  01/18/2023   Procedure: MALONEY DILATION;  Surgeon: Onita Elspeth Sharper, DO;  Location: [facility] ENDOSCOPY;  Service: Gastroenterology;;   PULSE GENERATOR IMPLANT N/A 08/21/2017   Procedure: UNILATERAL PULSE GENERATOR IMPLANT;  Surgeon: Clois Fret, MD;  Location: [facility] ORS;  Service: Neurosurgery;  Laterality: N/A;   PULSE GENERATOR IMPLANT Right 04/02/2018   Procedure: REMOVAL OF PULSE GENERATOR  IMPLANT AND LEADS;  Surgeon: Clois Fret, MD;  Location: [facility] ORS;  Service: Neurosurgery;  Laterality: Right;   TONSILLECTOMY     TOTAL KNEE ARTHROPLASTY Right 11/15/2014   Procedure: TOTAL KNEE ARTHROPLASTY;  Surgeon: Lamar Millman, MD;  Location: [facility] OR;  Service: Orthopedics;  Laterality: Right;    Family Psychiatric  History: Please see initial evaluation for full details. I have reviewed the history. No updates at this time.     Family History:  Family History  Problem Relation Age of Onset   Heart attack Mother    Heart disease Mother    Hypertension Father    Depression Father    Kidney cancer Neg Hx    Prostate cancer Neg Hx     Social History:  Social History   Socioeconomic History   Marital status: Married    Spouse name: Not on file   Number of children: Not on file   Years of education: Not on file   Highest education level: Master's degree (e.g., MA, MS, MEng, MEd, MSW, MBA)  Occupational History   Not on file  Tobacco Use   Smoking status: Every Day    Current packs/day: 0.50    Average packs/day: 0.5 packs/day for 68.1 years (34.0 ttl pk-yrs)    Types: Cigarettes    Start date: 12    Passive exposure: Current   Smokeless tobacco: Never  Vaping Use   Vaping status: Former  Substance and Sexual Activity   Alcohol  use: Not Currently    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   Drug use: Never   Sexual activity: Not Currently  Other Topics Concern   Not on file  Social History Narrative   Not on file   Social Drivers of Health   Financial Resource Strain: Medium Risk (05/03/2023)   Overall Financial Resource Strain (CARDIA)    Difficulty of Paying Living Expenses: Somewhat hard  Food Insecurity: No Food Insecurity (05/03/2023)   Hunger Vital Sign    Worried About Running Out of Food in the Last Year: Never true    Ran Out of Food in the Last Year: Never true  Transportation Needs: No Transportation Needs (05/03/2023)   PRAPARE - Administrator, Civil Service (Medical): No    Lack of Transportation (Non-Medical): No  Physical Activity: Insufficiently Active (05/03/2023)   Exercise Vital Sign    Days of Exercise per Week: 1 day    Minutes of Exercise per Session: 30 min  Stress: No Stress Concern Present (05/03/2023)   [employer] of  Occupational Health - Occupational Stress Questionnaire    Feeling of Stress : Not at all  Social Connections: Socially Integrated (05/03/2023)   Social Connection and Isolation Panel [NHANES]    Frequency of Communication with Friends and Family: More than three times a week    Frequency of Social Gatherings with Friends and Family: Three times a week    Attends Religious Services: More than 4 times per year    Active Member of Clubs or Organizations: No    Attends Engineer, Structural: More than 4 times per year    Marital Status: Married    Allergies: No Known Allergies  Metabolic Disorder Labs: Lab Results  Component Value Date   HGBA1C 7.3 (A) 03/19/2023   MPG 131 01/25/2016   MPG 166 11/05/2014   No results found for: PROLACTIN Lab Results  Component Value Date   CHOL 119 08/27/2022   TRIG 83.0 08/27/2022  HDL 65.70 08/27/2022   CHOLHDL 2 08/27/2022   VLDL 16.6 08/27/2022   LDLCALC 37 08/27/2022   LDLCALC 57 09/25/2017   Lab Results  Component Value Date   TSH 0.59 01/16/2023   TSH 1.93 12/04/2022    Therapeutic Level Labs: No results found for: LITHIUM No results found for: VALPROATE No results found for: CBMZ  Current Medications: Current Outpatient Medications  Medication Sig Dispense Refill   aspirin  EC 81 MG tablet Take 81 mg by mouth daily.     cholestyramine (QUESTRAN) 4 g packet Take 1 packet by mouth 2 (two) times daily.     Continuous Glucose Receiver (FREESTYLE LIBRE 2 READER) DEVI Use to check glucose at least every 8 hours 1 each 0   Continuous Glucose Sensor (FREESTYLE LIBRE 2 SENSOR) MISC Apply every 14 days to check glucose 4 each 3   cyclobenzaprine  (FLEXERIL ) 10 MG tablet Take 1 tablet (10 mg total) by mouth 3 (three) times daily as needed for muscle spasms. 30 tablet 0   dapagliflozin  propanediol (FARXIGA ) 10 MG TABS tablet Take 10 mg by mouth daily.     diphenoxylate -atropine  (LOMOTIL ) 2.5-0.025 MG tablet Take by mouth.      divalproex  (DEPAKOTE  ER) 500 MG 24 hr tablet Take 1 tablet by mouth 2 (two) times daily.     donepezil  (ARICEPT ) 10 MG tablet Take 10 mg by mouth at bedtime.     DULoxetine  (CYMBALTA ) 30 MG capsule Take 2 capsules (60 mg total) by mouth daily. 60 capsule 3   fenofibrate  (TRICOR ) 145 MG tablet TAKE 1 TABLET BY MOUTH DAILY 90 tablet 1   gabapentin  (NEURONTIN ) 600 MG tablet TAKE TWO TABLETS BY MOUTH THREE TIMES A DAY 180 tablet 1   lidocaine  (LIDODERM ) 5 % Place 1 patch onto the skin every 12 (twelve) hours. Remove & Discard patch within 12 hours or as directed by MD 30 patch 2   lisinopril -hydrochlorothiazide  (ZESTORETIC ) 20-25 MG tablet TAKE 1 TABLET BY MOUTH 2 TIMES A DAY 180 tablet 1   memantine  (NAMENDA ) 10 MG tablet Take 1 tablet by mouth daily.     metFORMIN  (GLUCOPHAGE -XR) 500 MG 24 hr tablet TAKE 1 TABLET BY MOUTH TWICE A DAY 180 tablet 1   metoprolol  succinate (TOPROL -XL) 25 MG 24 hr tablet Take 25 mg by mouth daily.     Multiple Vitamins-Minerals (CENTRUM SILVER PO) Take 1 tablet by mouth daily.     nystatin  (MYCOSTATIN ) 100000 UNIT/ML suspension Take 5 mLs (500,000 Units total) by mouth 4 (four) times daily. Swish in the mouth and retain for as long as possible (several minutes) before swallowing. Use for 7-14 days. 120 mL 0   omega-3 acid ethyl esters (LOVAZA ) 1 g capsule TAKE 2 CAPSULES BY MOUTH TWICE A DAY 360 capsule 3   omeprazole  (PRILOSEC ) 20 MG capsule Take 20 mg by mouth daily.     [START ON 07/03/2023] oxyCODONE -acetaminophen  (PERCOCET) 10-325 MG tablet Take 1 tablet by mouth every 6 (six) hours as needed for pain. 120 tablet 0   [START ON 08/02/2023] oxyCODONE -acetaminophen  (PERCOCET) 10-325 MG tablet Take 1 tablet by mouth every 6 (six) hours as needed for pain. 120 tablet 0   [START ON 09/01/2023] oxyCODONE -acetaminophen  (PERCOCET) 10-325 MG tablet Take 1 tablet by mouth every 6 (six) hours as needed for pain. 120 tablet 0   pantoprazole  (PROTONIX ) 40 MG tablet Take 40 mg by  mouth daily.     pramipexole  (MIRAPEX ) 0.125 MG tablet TAKE 1 TABLET BY MOUTH EVERY NIGHT 2-3  HOURS BEFORE BEDTIME 90 tablet 1   primidone  (MYSOLINE ) 50 MG tablet Take 150-250 mg by mouth 2 (two) times daily. Take 250 mg by mouth in the morning & take 150 mg at night.     QUEtiapine  (SEROQUEL ) 25 MG tablet Take 75 mg by mouth at bedtime.     rosuvastatin  (CRESTOR ) 40 MG tablet TAKE ONE TABLET BY MOUTH DAILY 90 tablet 3   tamsulosin  (FLOMAX ) 0.4 MG CAPS capsule TAKE 2 CAPSULES BY MOUTH DAILY AFTER SUPPER 180 capsule 0   TRADJENTA  5 MG TABS tablet TAKE 1 TABLET BY MOUTH DAILY 30 tablet 5   traZODone  (DESYREL ) 100 MG tablet Take 2 tablets (200 mg total) by mouth at bedtime as needed for sleep. 180 tablet 1   Trospium  Chloride 60 MG CP24 Take 1 capsule (60 mg total) by mouth daily. 30 capsule 11   vitamin C  (ASCORBIC ACID ) 500 MG tablet Take 1,000 mg by mouth daily.     No current facility-administered medications for this visit.     Musculoskeletal: Strength & Muscle Tone: within normal limits Gait & Station: normal Patient leans: N/A  Psychiatric Specialty Exam: Review of Systems  There were no vitals taken for this visit.There is no height or weight on file to calculate BMI.  General Appearance: {Appearance:22683}  Eye Contact:  {B[facility] EYE CONTACT:22684}  Speech:  Clear and Coherent  Volume:  Normal  Mood:  {BHH MOOD:22306}  Affect:  {Affect (PAA):22687}  Thought Process:  Coherent  Orientation:  Full (Time, Place, and Person)  Thought Content: Logical   Suicidal Thoughts:  {ST/HT (PAA):22692}  Homicidal Thoughts:  {ST/HT (PAA):22692}  Memory:  Immediate;   Good  Judgement:  {Judgement (PAA):22694}  Insight:  {Insight (PAA):22695}  Psychomotor Activity:  Normal  Concentration:  Concentration: Good and Attention Span: Good  Recall:  Good  Fund of Knowledge: Good  Language: Good  Akathisia:  No  Handed:  Right  AIMS (if indicated): not done  Assets:  Communication  Skills Desire for Improvement  ADL's:  Intact  Cognition: WNL  Sleep:  {B[facility] GOOD/FAIR/POOR:22877}   Screenings: GAD-7    Garment/textile Technologist Visit from 05/06/2023 in [facility] [employer] Visit from 03/19/2023 in [facility] [employer] Visit from 02/27/2023 in [facility] [employer] Visit from 01/16/2023 in [facility] [employer] Visit from 12/04/2022 in [facility] [employer]  Total GAD-7 Score 11 0 2 5 2       Mini-Mental    Flowsheet Row Clinical Support from 11/15/2017 in [facility] [employer] [facility] Clinical Support from 11/14/2016 in [facility] [employer]  Total Score (max 30 points ) 30 30      PHQ2-9    Flowsheet Row Office Visit from 06/18/2023 in [facility] Visit from 05/06/2023 in [facility] Procedure visit from 03/27/2023 in [facility] Visit from 03/19/2023 in [facility] [employer] Visit from 02/27/2023 in [facility] [employer]  PHQ-2 Total Score 0 4 0 0 0  PHQ-9 Total Score -- 10 -- 0 2      Flowsheet Row Admission (Discharged) from 03/25/2023 in [facility] CENTER PERIOP Admission (Discharged) from 03/12/2023 in [facility] SURGICAL  CENTER PERIOP Admission (Discharged) from 01/18/2023 in [facility]  C-SSRS RISK CATEGORY No Risk No Risk No Risk        Assessment and Plan:  James Hood is a 83 y.o. year old male with a history of  pseudo dementia, sleep disorder, tremor, history of QTc prolongation , who presents for follow up appointment  for below.    1. MDD (major depressive disorder), recurrent, in partial remission (HCC) Acute stressors include: ankle pain, moving into a camper due to financial strain  Other stressors include: conflict with his step son, financial strain    History: originally on bupropion  150 mg BID, quetiapine  150 mg at night   There has been a steady improvement in depressive symptoms, anxiety since the last visit.  In retrospect, he was likely experiencing mood symptoms due to issues with adjustment.  However, he feels comfortable to stay on the current dose of duloxetine  at this time.  Will continue duloxetine  along with bupropion  to target depression.  Noted that he is on quetiapine , gabapentin , and Depakote , which have been prescribed by another provider.  To avoid polypharmacy, will consider working on tapering off duloxetine  in the future if his mood continues to be stable.    2. Insomnia, unspecified type Stable.  Will continue trazodone  as needed for insomnia.  He is aware of his potential risk of drowsiness and fall especially with concomitant use of quetiapine , gabapentin  and opioid.     # Alcohol  use disorder in sustained remission  History:  He began drinking in college but has been abstinent since the age of 15. He actively participates in [employer] Stable. Although he reports occasional craving, he has been abstinent from alcohol .  Will continue motivational interview.    # History of cognitive impairment Followed-up by neurologist, and has been on memantine  and donepezil . 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 Acute risk factors for suicide include: N/A. Protective factors for this patient include: positive social support, responsibility to others (children, family), coping skills and hope for the future. Considering these factors, the overall suicide risk at this point appears to be low. Patient is appropriate for outpatient follow up.    Collaboration of Care: Collaboration of Care: {BH OP Collaboration of Care:21014065}  Patient/Guardian was advised Release of Information must be obtained prior to any record release in order to collaborate their care with an outside provider. Patient/Guardian was advised if they have not already done so to contact the registration department to sign all necessary forms in order for us  to release information regarding their care.   Consent: Patient/Guardian gives verbal consent for treatment and assignment of benefits for services provided during this visit. Patient/Guardian expressed understanding and agreed to proceed.    Katheren Sleet, MD 06/18/2023, 4:31 PM

## 2023-06-18 NOTE — Progress Notes (Signed)
PROVIDER NOTE: Information contained herein reflects review and annotations entered in association with encounter. Interpretation of such information and data should be left to medically-trained personnel. Information provided to patient can be located elsewhere in the medical record under "Patient Instructions". Document created using STT-dictation technology, any transcriptional errors that may result from process are unintentional.    Patient: James Hood  Service Category: E/M  Provider: Edward Jolly, MD  DOB: 1941-04-21  DOS: 06/18/2023  Referring Provider: Glori Luis, MD  MRN: [id number]  Specialty: Interventional Pain Management  PCP: Glori Luis, MD  Type: Established Patient  Setting: Ambulatory outpatient    Location: Office  Delivery: Face-to-face     HPI  [name], a 83 y.o. 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 Mr. Narvaiz's primary complain today is Leg Pain (Lower extremity )  Pertinent problems: Mr. Longton has Primary localized osteoarthritis of right knee; DJD (degenerative joint disease) of knee; Lumbar herniated disc; Chronic pain syndrome; Failed back surgical syndrome; Lumbosacral radiculopathy; Primary osteoarthritis of left ankle; Lumbar facet joint syndrome; Neuropathy; Chronic pain of left ankle; and Osteochondral defect of ankle on their pertinent problem list. Pain Assessment: Severity of Chronic pain is reported as a 5 /10. Location: Calf Right, Left/radaites from outer calves to ankles bilateral. Onset: More than a month ago. Quality: Constant, Aching. Timing: Constant. Modifying factor(s): Medication helps only a bit. Vitals:  height is 5\' 10"  (1.778 m) and weight is 165 lb (74.8 kg). His temporal temperature is 98.3 F (36.8 C). His blood pressure is 142/68 (abnormal) and his pulse is 71. His respiration is 18 and oxygen saturation is 98%.  BMI: Estimated body mass index is 23.68  kg/m as calculated from the following:   Height as of this encounter: 5\' 10"  (1.778 m).   Weight as of this encounter: 165 lb (74.8 kg). Last encounter: 12/20/2022. Last procedure: Visit date not found.  Reason for encounter: medication management.   Patient is experiencing increased low back and radiating left leg pain.  lumbar MRI shows severe left L4-L5 neuroforaminal narrowing and left L5-S1 neuroforaminal narrowing. He has undergone extensive workup and treatments, including consultations with orthopedics, spinal cord stimulation, ankle injections, and podiatry, but none have provided lasting relief. The spinal cord stimulation was effective but had to be removed due to an infection that nearly led to sepsis.  He has been experiencing persistent ankle pain for the past ten years. The pain is debilitating, worsens at night, and radiates upwards, necessitating rest. Various footwear has been tried to alleviate discomfort, with some relief found in shoes lacking arch support.  The pain significantly affects his mood, making him irritable. He is not concerned about the cost of potential treatments due to having good insurance coverage.  He has a history of type 2 diabetes but denies burning or tingling in his feet, although he reports some numbness.       ROS  Constitutional: Denies any fever or chills Gastrointestinal: No reported hemesis, hematochezia, vomiting, or acute GI distress Musculoskeletal:  Low back pain which radiates into left leg, left ankle pain Neurological: No reported episodes of acute onset apraxia, aphasia, dysarthria, agnosia, amnesia, paralysis, loss of coordination, or loss of consciousness  Medication Review  DULoxetine, FreeStyle Libre 2 Reader, [employer] 2 Sensor, Multiple Vitamins-Minerals, QUEtiapine, Trospium Chloride, ascorbic acid, aspirin EC, cholestyramine, cyclobenzaprine, dapagliflozin propanediol, diphenoxylate-atropine, divalproex, donepezil,  fenofibrate, gabapentin, lidocaine, linagliptin, lisinopril-hydrochlorothiazide, memantine, metFORMIN, metoprolol  succinate, nystatin, omega-3 acid ethyl esters, omeprazole, oxyCODONE-acetaminophen, pantoprazole, pramipexole, primidone, rosuvastatin, tamsulosin, and traZODone  History Review  Allergy: [name] has no known allergies. Drug: [name]  reports no history of drug use. Alcohol:  reports that he does not currently use alcohol. Tobacco:  reports that he has been smoking cigarettes. He started smoking about 68 years ago. He has a 34 pack-year smoking history. He has been exposed to tobacco smoke. He has never used smokeless tobacco. Social: [name]  reports that he has been smoking cigarettes. He started smoking about 68 years ago. He has a 34 pack-year smoking history. He has been exposed to tobacco smoke. He has never used smokeless tobacco. He reports that he does not currently use alcohol. He reports that he does not use drugs. 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 Surgical: 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 Family: family history includes Depression in his father; Heart attack in his mother; Heart disease in his mother; Hypertension in his father.  Laboratory Chemistry Profile   Renal Lab Results  Component Value Date   BUN 43 (H) 01/16/2023   CREATININE 1.37 01/16/2023   BCR 23 10/08/2022   GFR 47.89 (L) 01/16/2023   GFRAA >60 09/25/2019   GFRNONAA 55 (L) 06/08/2020    Hepatic Lab Results  Component Value Date   AST 32 12/04/2022   ALT 23 12/04/2022   ALBUMIN 4.0 12/04/2022   ALKPHOS 55 12/04/2022   AMMONIA 14 08/09/2016    Electrolytes Lab Results  Component Value Date   NA 134 (L) 01/16/2023   K 4.3 01/16/2023   CL 98 01/16/2023   CALCIUM 9.6 01/16/2023   MG 1.4 (L) 09/08/2020    Bone Lab Results  Component Value Date   VD25OH 37.09 11/10/2021    Inflammation (CRP: Acute Phase) (ESR: Chronic Phase) Lab Results  Component Value Date   ESRSEDRATE 15 07/20/2019   LATICACIDVEN 1.9 06/08/2020         Note: Above Lab results reviewed.  Recent Imaging Review  DG PAIN CLINIC C-ARM 1-60 MIN NO REPORT Fluoro was used, but no Radiologist interpretation will be provided.  Please refer to "NOTES" tab for provider progress note.  EXAM: Magnetic resonance imaging, spinal canal and contents, lumbar, without contrast material.  DATE: 12/26/2022 1:17 PM  ACCESSION: [id number]  DICTATED: 12/26/2022 1:34 PM  INTERPRETATION LOCATION: [facility]   CLINICAL INDICATION: 83 years old Male with low back pain, radiating leg pain  - M54.16 - Left lumbar radiculopathy     COMPARISON: None   TECHNIQUE: Multiplanar MRI was performed through the lumbar spine without intravenous  contrast.   FINDINGS:   Multilevel degenerative endplate changes, most pronounced at L5-S1.  Schmorl nodes at inferior endplate of L1, L2-L3, and superior endplate of L4.  Heterogenous bone marrow signal intensity, likely degenerative.   The visualized cord is unremarkable.  The conus medullaris ends at a normal level.   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  Straightening of normal lumbar lordosis.  Trace anterolisthesis at L4-L5.  Trace left lateral listhesis at L4-L5.  Vertebral alignment is otherwise unremarkable.   Multilevel disc degeneration and height loss.   T12-L1: Mild diffuse disc bulge and mild ligamentum flavum thickening. No significant spinal canal or neural foraminal narrowing.   L1-L2: Diffuse disc bulge with ligamentum flavum thickening and facet hypertrophy. No significant central spinal canal stenosis. Mild right neural foraminal narrowing.   L2-L3: Diffuse disc bulge with ligamentum flavum thickening and right predominant facet hypertrophy. No significant central spinal canal stenosis. Mild right and minimal left neural foraminal stenosis.   L3-L4: Diffuse disc bulge with central annular fissuring and right foraminal protrusion, ligamentum flavum thickening and right facet arthrosis contributes to mild spinal canal stenosis, partial effacement of the right lateral recess, mild left and severe right neural foraminal narrowing. The disc contacts the bilateral traversing nerve roots [4:131].   L4-L5: Asymmetric disc bulge with ligamentum flavum thickening and facet arthropathy, contributes to mild spinal canal stenosis, moderate right and moderate to severe left neural foraminal narrowing.   L5-S1: Asymmetric disc bulge with ligamentum flavum thickening and facet arthropathy contributes to mild spinal canal stenosis, severe left and  mild-to-moderate right neural foraminal narrowing.   Mild fatty atrophy of posterior paraspinal muscles.  Nonspecific perinephric fat stranding bilaterally.  Paravertebral soft tissues are otherwise unremarkable.   For the purposes of this dictation, the lowest well formed intervertebral disc space is assumed to be the L5-S1 level, and there are presumed to be five lumbar-type vertebral bodies.   Note: Reviewed        Physical Exam  General appearance: Well nourished, well developed, and well hydrated. In no apparent acute distress Mental status: Alert, oriented x 3 (person, place, & time)       Respiratory: No evidence of acute respiratory distress Eyes: PERLA Vitals: BP (!) 142/68 (Cuff Size: Normal)   Pulse 71   Temp 98.3 F (36.8 C) (Temporal)   Resp 18   Ht 5\' 10"  (1.778 m)   Wt 165 lb (74.8 kg)   SpO2 98%   BMI 23.68 kg/m  BMI: Estimated body mass index is 23.68 kg/m as calculated from the following:   Height as of this encounter: 5\' 10"  (1.778 m).   Weight as of this encounter: 165 lb (74.8 kg). Ideal: Ideal body weight: 73 kg (160 lb 15 oz) Adjusted ideal body weight: 73.7 kg (162 lb 9 oz)  Lumbar Spine Area Exam  Skin & Axial Inspection: No masses, redness, or swelling Alignment: Symmetrical Functional ROM: Pain restricted ROM affecting primarily the left Stability: No instability detected Muscle Tone/Strength: Functionally intact. No obvious neuro-muscular anomalies detected. Sensory (Neurological): Dermatomal pain pattern  Palpation: No palpable anomalies         Gait & Posture Assessment  Ambulation: Limited Gait: Relatively normal for age and body habitus Posture: WNL  Lower Extremity Exam    Side: Right lower extremity  Side: Left lower extremity  Stability: No instability observed          Stability: No instability observed  Skin & Extremity Inspection: Skin color, temperature, and hair growth are WNL. No peripheral edema or cyanosis. No masses,  redness, swelling, asymmetry, or associated skin lesions. No contractures.  Skin & Extremity Inspection: Skin color, temperature, and hair growth are WNL. No peripheral edema or cyanosis. No masses, redness, swelling, asymmetry, or associated skin lesions. No contractures.  Functional ROM: Unrestricted ROM                  Functional ROM: Pain restricted ROM for hip and knee joints          Muscle Tone/Strength: Functionally intact. No obvious neuro-muscular anomalies detected.  Muscle Tone/Strength: Functionally intact. No obvious neuro-muscular anomalies detected.  Sensory (Neurological): Unimpaired        Sensory (Neurological): Neurogenic pain pattern        DTR: Patellar: deferred today Achilles: deferred today Plantar: deferred today  DTR: Patellar: deferred today Achilles: deferred today Plantar: deferred today  Palpation: No palpable anomalies  Palpation: No palpable anomalies    Assessment   Diagnosis Status  1. 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 Lumbosacral radiculopathy   3. Sciatica of left side associated with disorder of lumbar spine   4. Chronic pain of left ankle   5. Lumbar facet joint syndrome   6. Primary osteoarthritis of left ankle   7. Chronic pain syndrome    Having a Flare-up Having a Flare-up Having a Flare-up   Updated Problems: No problems updated.   Plan of Care  Assessment and Plan    Chronic Ankle Pain Persistent and severe ankle pain has been present for ten years, resistant to various treatments including orthopedics, spinal cord stimulation, ankle injections, and podiatry. Previous spinal cord stimulation was effective but stopped due to infection and sepsis risk. Sciatic nerve stimulation is being considered as a potential treatment. It is low risk and does not necessarily require an implant, with most patients achieving long-term relief after sixty days. A nerve block at the branch point of the sciatic  nerve to the common peroneal and tibial nerves is recommended to assess pain relief before committing to the stimulator. If significant relief is achieved, consider proceeding with sciatic nerve stimulator implantation.  Type 2 Diabetes Mellitus Type 2 Diabetes Mellitus is present with numbness in the feet but no burning or tingling. Qutenza (8% capsaicin) is discussed as a potential option for posterior foot pain relief, especially if burning and tingling develop. Qutenza is a film applied to the foot for thirty minutes and is indicated for diabetic nerve pain.  Follow-up Schedule the nerve block procedure within the next two weeks, sooner if there are cancellations. A follow-up appointment is set for three months for medication review.       MM as below: Requested Prescriptions   Signed Prescriptions Disp Refills   oxyCODONE-acetaminophen (PERCOCET) 10-325 MG tablet 120 tablet 0    Sig: Take 1 tablet by mouth every 6 (six) hours as needed for pain.   oxyCODONE-acetaminophen (PERCOCET) 10-325 MG tablet 120 tablet 0    Sig: Take 1 tablet by mouth every 6 (six) hours as needed for pain.   oxyCODONE-acetaminophen (PERCOCET) 10-325 MG tablet 120 tablet 0    Sig: Take 1 tablet by mouth every 6 (six) hours as needed for pain.     Orders:  Orders Placed This Encounter  Procedures   SCIATIC NERVE BLOCK    Standing Status:   Future    Expected Date:  07/02/2023    Expiration Date:   09/16/2023    Scheduling Instructions:     Side: LEFT     Sedation: without     Timeframe: 2 weeks    Where will this procedure be performed?:   [facility]   Follow-up plan:   Return in about 20 days (around 07/08/2023) for Left popliteal sciatic block under US guidance.     Recent Visits Date Type Provider Dept  03/27/23 Procedure visit Edward Jolly, MD [facility]  Showing recent visits within past 90 days and meeting all other requirements Today's Visits Date Type Provider Dept   06/18/23 Office Visit Edward Jolly, MD [facility]  Showing today's visits and meeting all other requirements Future Appointments Date Type Provider Dept  07/08/23 Appointment Edward Jolly, MD [facility]  Showing future appointments within next 90 days and meeting all other requirements  I discussed the assessment and treatment plan with the patient. The patient was provided an opportunity to ask questions and all were answered. The patient agreed with the plan and demonstrated an understanding of the instructions.  Patient advised to call back or seek an in-person evaluation if the symptoms or condition worsens.  Duration of encounter: .  Total time on encounter, as per AMA guidelines included both the face-to-face and non-face-to-face time personally spent by the physician and/or other qualified health care professional(s) on the day of the encounter (includes time in activities that require the physician or other qualified health care professional and does not include time in activities normally performed by clinical staff). Physician's time may include the following activities when performed: Preparing to see the patient (e.g., pre-charting review of records, searching for previously ordered imaging, lab work, and nerve conduction tests) Review of prior analgesic pharmacotherapies. Reviewing PMP Interpreting ordered tests (e.g., lab work, imaging, nerve conduction tests) Performing post-procedure evaluations, including interpretation of diagnostic procedures Obtaining and/or reviewing separately obtained history Performing a medically appropriate examination and/or evaluation Counseling and educating the patient/family/caregiver Ordering medications, tests, or procedures Referring and communicating with other health care professionals (when not separately reported) Documenting clinical information in the electronic or other health record Independently  interpreting results (not separately reported) and communicating results to the patient/ family/caregiver Care coordination (not separately reported)  Note by: Edward Jolly, MD Date: 06/18/2023; Time: 12:06 PM

## 2023-06-19 ENCOUNTER — Encounter: Payer: Self-pay | Admitting: Pharmacist

## 2023-06-19 NOTE — Progress Notes (Signed)
   06/19/2023 Name: James Hood MRN: [id number] DOB: 1941-03-05  Subjective  Chief Complaint  Patient presents with   Medication Assistance    Care Team: Primary Care Provider: Glori Luis, MD  Reason for visit: ? Previous request from PCP to [facility] Medication Advocate Team for Tradjenta PAP due to patient falling into [facility] at end of 2024.  [facility] Medication Advocate Team States they have been unable to reach James Hood. Called and reached patient on first attempt, 06/19/23.   Medication Access: ?  Prescription drug coverage: Payor: [employer] / Plan: [employer] PPO / Product Type: *No Product type* / .   Patient reports that he believes cost of Tradjenta is more reasonable now that he is out of donut hole.   Current Patient Assistance:  None  Patient lives in a household of 2 with an estimated combined annual income of close to $80,000  (around 400% [employer])  Medicare LIS Eligible: No ; Income limit exceeded  Assessment and Plan:   1. Medication Access Patient not eligible to receive Tradjenta through PAP given income exceeds limit.  Patient reports he cannot recall Jan copay though believed it was affordable for him Advised patient to contact clinic should Tradjenta or any other medications be too costly for him this year. If cost of Tradjenta becomes burdensome, could try for [employer] PAP program for Januvia (though patient may be close to or above income limit of ~84,600/year).   Loree Fee, PharmD Clinical Pharmacist [facility] [phone]

## 2023-06-25 ENCOUNTER — Ambulatory Visit: Payer: Self-pay | Admitting: Psychiatry

## 2023-07-02 ENCOUNTER — Encounter: Payer: Self-pay | Admitting: Nurse Practitioner

## 2023-07-02 ENCOUNTER — Ambulatory Visit (INDEPENDENT_AMBULATORY_CARE_PROVIDER_SITE_OTHER): Payer: PPO | Admitting: Nurse Practitioner

## 2023-07-02 VITALS — BP 120/62 | HR 72 | Temp 98.0°F | Ht 70.0 in | Wt 166.4 lb

## 2023-07-02 DIAGNOSIS — F419 Anxiety disorder, unspecified: Secondary | ICD-10-CM

## 2023-07-02 DIAGNOSIS — I10 Essential (primary) hypertension: Secondary | ICD-10-CM

## 2023-07-02 DIAGNOSIS — G629 Polyneuropathy, unspecified: Secondary | ICD-10-CM

## 2023-07-02 DIAGNOSIS — N3281 Overactive bladder: Secondary | ICD-10-CM

## 2023-07-02 DIAGNOSIS — M5417 Radiculopathy, lumbosacral region: Secondary | ICD-10-CM

## 2023-07-02 DIAGNOSIS — Z7984 Long term (current) use of oral hypoglycemic drugs: Secondary | ICD-10-CM

## 2023-07-02 DIAGNOSIS — F32A Depression, unspecified: Secondary | ICD-10-CM

## 2023-07-02 DIAGNOSIS — E1142 Type 2 diabetes mellitus with diabetic polyneuropathy: Secondary | ICD-10-CM

## 2023-07-02 DIAGNOSIS — E785 Hyperlipidemia, unspecified: Secondary | ICD-10-CM

## 2023-07-02 LAB — POCT GLYCOSYLATED HEMOGLOBIN (HGB A1C)
HbA1c POC (<> result, manual entry): 7.1 % (ref 4.0–5.6)
HbA1c, POC (controlled diabetic range): 7.1 % — AB (ref 0.0–7.0)
HbA1c, POC (prediabetic range): 7.1 % — AB (ref 5.7–6.4)
Hemoglobin A1C: 7.1 % — AB (ref 4.0–5.6)

## 2023-07-02 MED ORDER — CYCLOBENZAPRINE HCL 10 MG PO TABS: 10.0000 mg | ORAL_TABLET | Freq: Three times a day (TID) | ORAL | 2 refills | Status: DC | PRN

## 2023-07-02 NOTE — Assessment & Plan Note (Signed)
Cholesterol is well controlled on Crestor 40 mg daily, Fenofibrate 145 mg daily and Lovaza 2 g twice daily. Continue current medication regimen. Encouraged healthy diet.

## 2023-07-02 NOTE — Assessment & Plan Note (Signed)
He reports relief with Gabapentin. Continue Gabapentin as prescribed. Follow up with Neurology.

## 2023-07-02 NOTE — Assessment & Plan Note (Signed)
Managed by Psychiatry. Continue current medication regimen. Follow up as scheduled.

## 2023-07-02 NOTE — Assessment & Plan Note (Signed)
Today's A1c is 7.1, within the target range for his age, improvement from October. He acknowledges the need for dietary improvement but is resistant to major changes. Continue the current regimen of Tradjenta 5 mg daily, Metformin 500 mg twice daily, and Farxiga 10 mg daily. Encourage moderation in carbohydrate intake and suggest protein-rich snacks.

## 2023-07-02 NOTE — Assessment & Plan Note (Signed)
Blood pressure is well controlled on Lisinopril-Hydrochlorothiazide 20-25 mg twice daily and Toprol XL 25 mg daily. Continue current medication regimen.

## 2023-07-02 NOTE — Assessment & Plan Note (Signed)
Refill sent on Flexeril 10 mg three times daily as needed. He rarely takes, last filled for 30 tablets in October. Counseled on common side effects such as drowsiness. Follow up with Pain Management as scheduled.

## 2023-07-02 NOTE — Progress Notes (Signed)
Bethanie Dicker, NP-C Phone: [phone]  James Hood is a 83 y.o. male who presents today for transfer of care.   Discussed the use of AI scribe software for clinical note transcription with the patient, who gave verbal consent to proceed.  History of Present Illness   James Hood "James Hood" is an 83 year old male with type 2 diabetes who presents for transfer of care.  His type 2 diabetes is managed with Tradjenta, metformin, and [place]. Previously, his diabetes was better controlled with Ozempic, which was discontinued due to excessive weight loss. He is currently satisfied with his weight at 165-166 pounds. He acknowledges dietary indiscretions, consuming high-carbohydrate meals like gravy biscuits and rice, affecting his blood sugar levels. He often skips lunch and snacks on cookies, potato chips, and Cheetos. Blood sugar checks are irregular, with fluctuations from 200 to 80 mg/dL. His last A1c in October was 7.3%.  He experiences excessive thirst and urination approximately every half hour, despite taking Flomax and trospium. He sees a urologist annually but reports no significant improvement in symptoms.  He has a history of restless leg syndrome, managed with gabapentin, which he takes three times a day. He also takes cyclobenzaprine as needed for muscle relaxation. He reports feeling unwell if he misses his morning medications, which include prescriptions for blood pressure and mood stabilization.  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 He does not use inhalers and reports no chest pain, shortness of breath, or muscle aches. He has a history of high blood pressure, which he does not monitor at home, but reports no issues with dizziness or abdominal pain.      Social History   Tobacco Use  Smoking Status Every Day   Current packs/day: 0.50   Average packs/day: 0.5 packs/day for 68.1 years (34.1 ttl pk-yrs)    Types: Cigarettes   Start date: 79   Passive exposure: Current  Smokeless Tobacco Never    Current Outpatient Medications on File Prior to Visit  Medication Sig Dispense Refill   aspirin EC 81 MG tablet Take 81 mg by mouth daily.     cholestyramine (QUESTRAN) 4 g packet Take 1 packet by mouth 2 (two) times daily.     Continuous Glucose Receiver (FREESTYLE LIBRE 2 READER) DEVI Use to check glucose at least every 8 hours 1 each 0   Continuous Glucose Sensor (FREESTYLE LIBRE 2 SENSOR) MISC Apply every 14 days to check glucose 4 each 3   dapagliflozin propanediol (FARXIGA) 10 MG TABS tablet Take 10 mg by mouth daily.     diphenoxylate-atropine (LOMOTIL) 2.5-0.025 MG tablet Take by mouth.     divalproex (DEPAKOTE ER) 500 MG 24 hr tablet Take 1 tablet by mouth 2 (two) times daily.     donepezil (ARICEPT) 10 MG tablet Take 10 mg by mouth at bedtime.     DULoxetine (CYMBALTA) 30 MG capsule Take 2 capsules (60 mg total) by mouth daily. 60 capsule 3   fenofibrate (TRICOR) 145 MG tablet TAKE 1 TABLET BY MOUTH DAILY 90 tablet 1   gabapentin (NEURONTIN) 600 MG tablet TAKE TWO TABLETS BY MOUTH THREE TIMES A DAY 180 tablet 1   lidocaine (LIDODERM) 5 % Place 1 patch onto the skin every 12 (twelve) hours. Remove & Discard patch within 12 hours or as directed by MD 30 patch 2   lisinopril-hydrochlorothiazide (ZESTORETIC) 20-25 MG tablet TAKE 1 TABLET BY MOUTH 2 TIMES A  DAY 180 tablet 1   memantine (NAMENDA) 10 MG tablet Take 1 tablet by mouth daily.     metFORMIN (GLUCOPHAGE-XR) 500 MG 24 hr tablet TAKE 1 TABLET BY MOUTH TWICE A DAY 180 tablet 1   metoprolol succinate (TOPROL-XL) 25 MG 24 hr tablet Take 25 mg by mouth daily.     Multiple Vitamins-Minerals (CENTRUM SILVER PO) Take 1 tablet by mouth daily.     nystatin (MYCOSTATIN) 100000 UNIT/ML suspension Take 5 mLs (500,000 Units total) by mouth 4 (four) times daily. Swish in the mouth and retain for as long as possible (several minutes) before  swallowing. Use for 7-14 days. 120 mL 0   omega-3 acid ethyl esters (LOVAZA) 1 g capsule TAKE 2 CAPSULES BY MOUTH TWICE A DAY 360 capsule 3   omeprazole (PRILOSEC) 20 MG capsule Take 20 mg by mouth daily.     [START ON 07/03/2023] oxyCODONE-acetaminophen (PERCOCET) 10-325 MG tablet Take 1 tablet by mouth every 6 (six) hours as needed for pain. 120 tablet 0   [START ON 08/02/2023] oxyCODONE-acetaminophen (PERCOCET) 10-325 MG tablet Take 1 tablet by mouth every 6 (six) hours as needed for pain. 120 tablet 0   [START ON 09/01/2023] oxyCODONE-acetaminophen (PERCOCET) 10-325 MG tablet Take 1 tablet by mouth every 6 (six) hours as needed for pain. 120 tablet 0   pantoprazole (PROTONIX) 40 MG tablet Take 40 mg by mouth daily.     pramipexole (MIRAPEX) 0.125 MG tablet TAKE 1 TABLET BY MOUTH EVERY NIGHT 2-3 HOURS BEFORE BEDTIME 90 tablet 1   primidone (MYSOLINE) 50 MG tablet Take 150-250 mg by mouth 2 (two) times daily. Take 250 mg by mouth in the morning & take 150 mg at night.     QUEtiapine (SEROQUEL) 25 MG tablet Take 75 mg by mouth at bedtime.     rosuvastatin (CRESTOR) 40 MG tablet TAKE ONE TABLET BY MOUTH DAILY 90 tablet 3   tamsulosin (FLOMAX) 0.4 MG CAPS capsule TAKE 2 CAPSULES BY MOUTH DAILY AFTER SUPPER 180 capsule 0   TRADJENTA 5 MG TABS tablet TAKE 1 TABLET BY MOUTH DAILY 30 tablet 5   traZODone (DESYREL) 100 MG tablet Take 2 tablets (200 mg total) by mouth at bedtime as needed for sleep. 180 tablet 1   Trospium Chloride 60 MG CP24 Take 1 capsule (60 mg total) by mouth daily. 30 capsule 11   vitamin C (ASCORBIC ACID) 500 MG tablet Take 1,000 mg by mouth daily.     No current facility-administered medications on file prior to visit.    ROS see history of present illness  Objective  Physical Exam Vitals:   07/02/23 1349  BP: 120/62  Pulse: 72  Temp: 98 F (36.7 C)  SpO2: 96%    BP Readings from Last 3 Encounters:  07/02/23 120/62  06/18/23 (!) 142/68  05/06/23 126/76   Wt  Readings from Last 3 Encounters:  07/02/23 166 lb 6.4 oz (75.5 kg)  06/18/23 165 lb (74.8 kg)  05/06/23 160 lb 12.8 oz (72.9 kg)    Physical Exam Constitutional:      General: He is not in acute distress.    Appearance: Normal appearance.  HENT:     Head: Normocephalic.  Cardiovascular:     Rate and Rhythm: Normal rate and regular rhythm.     Heart sounds: Normal heart sounds.  Pulmonary:     Effort: Pulmonary effort is normal.     Breath sounds: Normal breath sounds.  Skin:    General: Skin  is warm and dry.  Neurological:     General: No focal deficit present.     Mental Status: He is alert.  Psychiatric:        Mood and Affect: Mood normal.        Behavior: Behavior normal.    Assessment/Plan: Please see individual problem list.  Type 2 diabetes mellitus with diabetic polyneuropathy, without long-term current use of insulin (HCC) Assessment & Plan: Today's A1c is 7.1, within the target range for his age, improvement from October. He acknowledges the need for dietary improvement but is resistant to major changes. Continue the current regimen of Tradjenta 5 mg daily, Metformin 500 mg twice daily, and Farxiga 10 mg daily. Encourage moderation in carbohydrate intake and suggest protein-rich snacks.   Orders: -     POCT glycosylated hemoglobin (Hb A1C)  OAB (overactive bladder) Assessment & Plan: He reports urinating every half hour despite taking Flomax and Trospium. The last urology visit was about 3 months ago. Continue the current regimen of Flomax and Trospium. Consider discussing symptoms with the urologist at the next visit.   Neuropathy Assessment & Plan: He reports relief with Gabapentin. Continue Gabapentin as prescribed. Follow up with Neurology.   Primary hypertension Assessment & Plan: Blood pressure is well controlled on Lisinopril-Hydrochlorothiazide 20-25 mg twice daily and Toprol XL 25 mg daily. Continue current medication regimen.    Hyperlipidemia,  unspecified hyperlipidemia type Assessment & Plan: Cholesterol is well controlled on Crestor 40 mg daily, Fenofibrate 145 mg daily and Lovaza 2 g twice daily. Continue current medication regimen. Encouraged healthy diet.    Lumbosacral radiculopathy Assessment & Plan: Refill sent on Flexeril 10 mg three times daily as needed. He rarely takes, last filled for 30 tablets in October. Counseled on common side effects such as drowsiness. Follow up with Pain Management as scheduled.   Orders: -     Cyclobenzaprine HCl; Take 1 tablet (10 mg total) by mouth 3 (three) times daily as needed for muscle spasms.  Dispense: 30 tablet; Refill: 2  Anxiety and depression Assessment & Plan: Managed by Psychiatry. Continue current medication regimen. Follow up as scheduled.      Return in about 3 months (around 09/29/2023) for Follow up.   Bethanie Dicker, NP-C [facility]

## 2023-07-02 NOTE — Assessment & Plan Note (Signed)
He reports urinating every half hour despite taking Flomax and Trospium. The last urology visit was about 3 months ago. Continue the current regimen of Flomax and Trospium. Consider discussing symptoms with the urologist at the next visit.

## 2023-07-04 ENCOUNTER — Other Ambulatory Visit: Payer: Self-pay | Admitting: Family Medicine

## 2023-07-04 DIAGNOSIS — E1142 Type 2 diabetes mellitus with diabetic polyneuropathy: Secondary | ICD-10-CM

## 2023-07-08 ENCOUNTER — Ambulatory Visit
Payer: HMO | Attending: Student in an Organized Health Care Education/Training Program | Admitting: Student in an Organized Health Care Education/Training Program

## 2023-07-08 ENCOUNTER — Encounter: Payer: Self-pay | Admitting: Student in an Organized Health Care Education/Training Program

## 2023-07-08 VITALS — BP 139/76 | HR 66 | Temp 97.8°F | Resp 18 | Ht 70.0 in | Wt 165.0 lb

## 2023-07-08 DIAGNOSIS — M5386 Other specified dorsopathies, lumbar region: Secondary | ICD-10-CM | POA: Diagnosis not present

## 2023-07-08 MED ORDER — DEXAMETHASONE SODIUM PHOSPHATE 10 MG/ML IJ SOLN
INTRAMUSCULAR | Status: AC
  Filled 2023-07-08: qty 1

## 2023-07-08 MED ORDER — DEXAMETHASONE SODIUM PHOSPHATE 10 MG/ML IJ SOLN
10.0000 mg | Freq: Once | INTRAMUSCULAR | Status: AC
  Administered 2023-07-08: 10 mg

## 2023-07-08 MED ORDER — LIDOCAINE HCL 2 % IJ SOLN
20.0000 mL | Freq: Once | INTRAMUSCULAR | Status: AC
  Administered 2023-07-08: 100 mg

## 2023-07-08 MED ORDER — LIDOCAINE HCL (PF) 2 % IJ SOLN
INTRAMUSCULAR | Status: AC
Start: 2023-07-08 — End: ?
  Filled 2023-07-08: qty 10

## 2023-07-08 MED ORDER — ROPIVACAINE HCL 2 MG/ML IJ SOLN
9.0000 mL | Freq: Once | INTRAMUSCULAR | Status: AC
  Administered 2023-07-08: 9 mL via PERINEURAL

## 2023-07-08 MED ORDER — ROPIVACAINE HCL 2 MG/ML IJ SOLN
INTRAMUSCULAR | Status: AC
  Filled 2023-07-08: qty 20

## 2023-07-08 NOTE — Progress Notes (Signed)
 Safety precautions to be maintained throughout the outpatient stay will include: orient to surroundings, keep bed in low position, maintain call bell within reach at all times, provide assistance with transfer out of bed and ambulation.

## 2023-07-08 NOTE — Progress Notes (Signed)
PROVIDER NOTE: Interpretation of information contained herein should be left to medically-trained personnel. Specific patient instructions are provided elsewhere under "Patient Instructions" section of medical record. This document was created in part using STT-dictation technology, any transcriptional errors that may result from this process are unintentional.  Patient: James Hood Type: Established DOB: 07-12-1940 MRN: [id number] PCP: Glori Luis, MD  Service: Procedure DOS: 07/08/2023 Setting: Ambulatory Location: Ambulatory outpatient facility Delivery: Face-to-face Provider: Edward Jolly, MD Specialty: Interventional Pain Management Specialty designation: 09 Location: Outpatient facility Ref. Prov.: Glori Luis, MD       Interventional Therapy   Procedure:               Type: Popiteal-Sciatic Nerve block under ultrasound guidance Laterality: Left (-LT)  Level: Superior to popliteal fossa Imaging: Ultrasound-guided Anesthesia: Local anesthesia (1-2% Lidocaine) DOS: 07/08/2023  Performed by: Edward Jolly, MD  Purpose: Diagnostic/Therapeutic Rationale (medical necessity): procedure needed and proper for the diagnosis and/or treatment of James Hood's medical symptoms and needs. 1. Sciatica of left side associated with disorder of lumbar spine    NAS-11 Pain score:   Pre-procedure: 5 /10   Post-procedure: 3 /10    Position / Prep / Materials:  Position: Supine, Modified Fowler's position with pillows under the targeted knee(s). The patient is placed in a supine position with the knee slightly flexed by placing a pillow in the popliteal fossa. Prep solution: ChloraPrep (2% chlorhexidine gluconate and 70% isopropyl alcohol) Materials:  Tray: Block Needle(s):  Type: Spinal  Gauge (G): 22 (PAJUNK) Length: 3.5-in  Qty: 1  H&P (Pre-op Assessment):  James Hood is a 83 y.o. (year old), male patient, seen today for interventional treatment. 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 James Hood has a current medication list which includes the following prescription(s): aspirin ec, cholestyramine, freestyle libre 2 reader, freestyle libre 2 sensor, cyclobenzaprine, dapagliflozin propanediol, diphenoxylate-atropine, divalproex, donepezil, duloxetine, fenofibrate, gabapentin, lisinopril-hydrochlorothiazide, memantine, metformin, metoprolol succinate, multiple vitamins-minerals, nystatin, omega-3 acid ethyl esters, omeprazole, oxycodone-acetaminophen, [START ON 08/02/2023] oxycodone-acetaminophen, [START ON 09/01/2023] oxycodone-acetaminophen, pantoprazole, pramipexole, primidone, quetiapine, rosuvastatin, tamsulosin, tradjenta, trazodone, trospium chloride, lidocaine, and ascorbic acid. His primarily concern today is the Ankle Pain (Left )  Initial Vital Signs:  Pulse/HCG Rate: 66ECG Heart Rate: 78 Temp: 97.8 F (36.6 C) Resp: 16 BP: 127/62 SpO2: 94 %  BMI: Estimated body mass index is 23.68 kg/m as calculated from the following:   Height as of this encounter: 5\' 10"  (1.778 m).   Weight as of this encounter: 165 lb (74.8 kg).  Risk Assessment: Allergies: Reviewed. He has no known allergies.  Allergy Precautions: None required Coagulopathies: Reviewed. None identified.  Blood-thinner therapy: None at this time Active  Infection(s): Reviewed. None identified. James Hood is afebrile  Site Confirmation: James Hood was asked to confirm the procedure and laterality before marking the site Procedure checklist: Completed Consent: Before the procedure and under the influence of no sedative(s), amnesic(s), or anxiolytics, the patient was informed of the treatment options, risks and possible complications. To fulfill our ethical and legal obligations, as recommended by the American Medical Association's Code of  Ethics, I have informed the patient of my clinical impression; the nature and purpose of the treatment or procedure; the risks, benefits, and possible complications of the intervention; the alternatives, including doing nothing; the risk(s) and benefit(s) of the alternative treatment(s) or procedure(s); and the risk(s) and benefit(s) of doing nothing. The patient was provided information about the general risks and possible complications associated with the procedure. These may include, but are not limited to: failure to achieve desired goals, infection, bleeding, organ or nerve damage, allergic reactions, paralysis, and death. In addition, the patient was informed of those risks and complications associated to the procedure, such as failure to decrease pain; infection; bleeding; organ or nerve damage with subsequent damage to sensory, motor, and/or autonomic systems, resulting in permanent pain, numbness, and/or weakness of one or several areas of the body; allergic reactions; (i.e.: anaphylactic reaction); and/or death. Furthermore, the patient was informed of those risks and complications associated with the medications. These include, but are not limited to: allergic reactions (i.e.: anaphylactic or anaphylactoid reaction(s)); adrenal axis suppression; blood sugar elevation that in diabetics may result in ketoacidosis or comma; water retention that in patients with history of congestive heart failure may result in  shortness of breath, pulmonary edema, and decompensation with resultant heart failure; weight gain; swelling or edema; medication-induced neural toxicity; particulate matter embolism and blood vessel occlusion with resultant organ, and/or nervous system infarction; and/or aseptic necrosis of one or more joints. Finally, the patient was informed that Medicine is not an exact science; therefore, there is also the possibility of unforeseen or unpredictable risks and/or possible complications that may result in a catastrophic outcome. The patient indicated having understood very clearly. We have given the patient no guarantees and we have made no promises. Enough time was given to the patient to ask questions, all of which were answered to the patient's satisfaction. James Hood has indicated that he wanted to continue with the procedure. Attestation: I, the ordering provider, attest that I have discussed with the patient the benefits, risks, side-effects, alternatives, likelihood of achieving goals, and potential problems during recovery for the procedure that I have provided informed consent. Date  Time: 07/08/2023  9:41 AM  Pre-Procedure Preparation:  Monitoring: As per clinic protocol. Respiration, ETCO2, SpO2, BP, heart rate and rhythm monitor placed and checked for adequate function Safety Precautions: Patient was assessed for positional comfort and pressure points before starting the procedure. Time-out: I initiated and conducted the "Time-out" before starting the procedure, as per protocol. The patient was asked to participate by confirming the accuracy of the "Time Out" information. Verification of the correct person, site, and procedure were performed and confirmed by me, the nursing staff, and the patient. "Time-out" conducted as per Joint Commission's Universal Protocol (UP.01.01.01). Time: 1034 Start Time: 1034 hrs.  Description/Narrative of Procedure:          Rationale (medical necessity):  procedure needed and proper for the diagnosis and/or treatment of the patient's medical symptoms and needs. Procedural Technique Safety Precautions: Aspiration looking for blood return was conducted prior to all injections. At no point did we inject any substances, as a needle was being advanced. No attempts were made at seeking any paresthesias. Safe injection practices and needle disposal techniques used. Medications properly checked for expiration dates. SDV (single dose vial) medications used.  Patient Positioning: The patient was positioned supine with the left leg elevated and externally rotated at the hip to provide posterior access to the popliteal fossa. A pillow or support was  placed under the knee for patient comfort. Sterile Prep: The left posterior thigh and popliteal fossa were prepped with chlorhexidine and draped in a sterile manner. Ultrasound Guidance: A high-frequency linear probe (8-15 MHz) was placed in a transverse orientation at the popliteal fossa to identify the sciatic nerve, approximately 5-7 cm proximal to the popliteal crease. The nerve was visualized as a hyperechoic, fascicular structure lateral to the popliteal artery and vein as it bifurcates into the tibial and common peroneal nerve. Block Technique Local Anesthesia: The skin and subcutaneous tissue were anesthetized with 2-3 mL of 1% lidocaine using a 25-gauge needle. Needle Approach: A 22-gauge, 100-mm echogenic block needle was introduced in-plane from a lateral-to-medial approach under continuous ultrasound visualization. Needle Placement: The needle was advanced adjacent to the sciatic nerve within the paraneural sheath, avoiding direct intraneural injection. Test Aspiration: Negative for blood  Local Anesthetic Injection: A total of 20 mL of  19cc of 0.2% ropivacaine and 1 cc of Decadron (10 mg/cc)  was slowly injected in 5 mL increments with intermittent aspiration, ensuring circumferential perineural  spread. Post-Procedure Assessment Ultrasound Confirmation: Hypoechoic local anesthetic spread around the sciatic nerve was visualized, confirming adequate distribution. No Immediate Complications: No signs of vascular injection, intraneural injection, or excessive resistance during injection.                Vitals:   07/08/23 0959 07/08/23 1028 07/08/23 1036 07/08/23 1052  BP: 127/62 (!) 127/55 113/65 139/76  Pulse: 66     Resp: 16 18 17 18   Temp: 97.8 F (36.6 C)     TempSrc: Temporal     SpO2: 94% 97% 95% 96%  Weight: 165 lb (74.8 kg)     Height: 5\' 10"  (1.778 m)        Start Time: 1034 hrs. End Time: 1052 hrs.  Imaging Guidance (Non-Spinal):          Type of Imaging Technique: Fluoroscopy Guidance (Non-Spinal) Indication(s): Fluoroscopy guidance for needle placement to enhance accuracy in procedures requiring precise needle localization for targeted delivery of medication in or near specific anatomical locations not easily accessible without such real-time imaging assistance. Exposure Time: Please see nurses notes. Contrast: None used. Fluoroscopic Guidance: I was personally present during the use of fluoroscopy. "Tunnel Vision Technique" used to obtain the best possible view of the target area. Parallax error corrected before commencing the procedure. "Direction-depth-direction" technique used to introduce the needle under continuous pulsed fluoroscopy. Once target was reached, antero-posterior, oblique, and lateral fluoroscopic projection used confirm needle placement in all planes. Images permanently stored in EMR. Interpretation: No contrast injected. I personally interpreted the imaging intraoperatively. Adequate needle placement confirmed in multiple planes. Permanent images saved into the patient's record.  Post-operative Assessment:  Post-procedure Vital Signs:  Pulse/HCG Rate: 6663 Temp: 97.8 F (36.6 C) Resp: 18 BP: 139/76 SpO2: 96 %  EBL:  None  Complications: No immediate post-treatment complications observed by team, or reported by patient.  Note: The patient tolerated the entire procedure well. A repeat set of vitals were taken after the procedure and the patient was kept under observation following institutional policy, for this type of procedure. Post-procedural neurological assessment was performed, showing return to baseline, prior to discharge. The patient was provided with post-procedure discharge instructions, including a section on how to identify potential problems. Should any problems arise concerning this procedure, the patient was given instructions to immediately contact us, at any time, without hesitation. In any case, we plan to contact the patient by telephone  for a follow-up status report regarding this interventional procedure.  Comments:  No additional relevant information.  Plan of Care (POC)  Orders:  No orders of the defined types were placed in this encounter.  Chronic Opioid Analgesic:   Oxycodone 10 mg every 6 hours as needed, quantity 120/month; MME equals 60    Medications ordered for procedure: Meds ordered this encounter  Medications   lidocaine (XYLOCAINE) 2 % (with pres) injection 400 mg   dexamethasone (DECADRON) injection 10 mg   ropivacaine (PF) 2 mg/mL (0.2%) (NAROPIN) injection 9 mL   Medications administered: We administered lidocaine, dexamethasone, and ropivacaine (PF) 2 mg/mL (0.2%).  See the medical record for exact dosing, route, and time of administration.  Follow-up plan:   Return in about 6 weeks (around 08/19/2023) for PPE and discuss PNS.      Recent Visits Date Type Provider Dept  06/18/23 Office Visit Edward Jolly, MD [facility]  Showing recent visits within past 90 days and meeting all other requirements Today's Visits Date Type Provider Dept  07/08/23 Procedure visit Edward Jolly, MD [facility]  Showing today's visits and meeting all other  requirements Future Appointments Date Type Provider Dept  08/21/23 Appointment Edward Jolly, MD [facility]  09/24/23 Appointment Edward Jolly, MD [facility]  Showing future appointments within next 90 days and meeting all other requirements  Disposition: Discharge home  Discharge (Date  Time): 07/08/2023; 1100 hrs.   Primary Care Physician: Glori Luis, MD Location: [facility] Note by: Edward Jolly, MD (TTS technology used. I apologize for any typographical errors that were not detected and corrected.) Date: 07/08/2023; Time: 11:58 AM  Disclaimer:  Medicine is not an Visual merchandiser. The only guarantee in medicine is that nothing is guaranteed. It is important to note that the decision to proceed with this intervention was based on the information collected from the patient. The Data and conclusions were drawn from the patient's questionnaire, the interview, and the physical examination. Because the information was provided in large part by the patient, it cannot be guaranteed that it has not been purposely or unconsciously manipulated. Every effort has been made to obtain as much relevant data as possible for this evaluation. It is important to note that the conclusions that lead to this procedure are derived in large part from the available data. Always take into account that the treatment will also be dependent on availability of resources and existing treatment guidelines, considered by other Pain Management Practitioners as being common knowledge and practice, at the time of the intervention. For Medico-Legal purposes, it is also important to point out that variation in procedural techniques and pharmacological choices are the acceptable norm. The indications, contraindications, technique, and results of the above procedure should only be interpreted and judged by a Board-Certified Interventional Pain Specialist with extensive familiarity  and expertise in the same exact procedure and technique.

## 2023-07-08 NOTE — Patient Instructions (Signed)

## 2023-07-09 ENCOUNTER — Telehealth: Payer: Self-pay

## 2023-07-09 NOTE — Telephone Encounter (Signed)
 Post procedure follow up.  Patient states he is doing fine

## 2023-07-10 ENCOUNTER — Ambulatory Visit: Payer: HMO | Admitting: Urology

## 2023-08-08 ENCOUNTER — Telehealth: Payer: Self-pay

## 2023-08-08 ENCOUNTER — Other Ambulatory Visit (HOSPITAL_COMMUNITY): Payer: Self-pay

## 2023-08-08 NOTE — Telephone Encounter (Signed)
 Pharmacy Patient Advocate Encounter  Received notification from [facility] [employer] that Tier Exception for Omega 3 acid ethyl esthers has been DENIED.  Full denial letter will be uploaded to the media tab. See denial reason below.

## 2023-08-16 NOTE — Progress Notes (Signed)
 [facility] MD/PA/NP OP Progress Note  08/20/2023 3:10 PM James Hood  MRN:  [id number]  Chief Complaint:  Chief Complaint  Patient presents with   Follow-up   HPI:  - James Hood is not seen since Sept 2024 This is a follow-up appointment for depression and insomnia.  James Hood states that things are going okay.  His wife never accepted that they live in a camper.  She wishes that they never sold the house.  Although James Hood tries, James Hood cannot get her accept this.  James Hood states that this is much smaller place.  However, James Hood does not want to leave happy.  James Hood may help to babysit his grandchildren, which is okay to him.  James Hood thinks James Hood and his wife has been more intimate since living at the current place.  James Hood sits in the porch while his wife is doing a gardening.  James Hood thinks his mood is okay.  However, James Hood would like to see a therapist to talk.  James Hood sleeps well.  James Hood denies feeling depressed or anxiety.  James Hood denies SI.  Although James Hood states James Hood takes bupropion, James Hood is unsure about this.  James Hood agrees to contact the office after verifying his medication list.   Visit Diagnosis:    ICD-10-CM   1. MDD (major depressive disorder), recurrent, in partial remission (HCC)  F33.41 Ambulatory referral to Psychology    2. Insomnia, unspecified type  G47.00       Past Psychiatric History: Please see initial evaluation for full details. I have reviewed the history. No updates at this time.     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   Colon polyps    Complication of anesthesia    Woke during first cataract procedure   Dementia (HCC)    Depression    Diabetes mellitus type 2 in nonobese (HCC)    GERD (gastroesophageal reflux disease)    BARRETTS ESOPHAGUS RESOLVED PER PATIENT   Headache    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HCC)    History of hiatal hernia     Hypercholesteremia    Hypertension    Hyperthyroidism    Neuropathic pain    Primary localized osteoarthritis of right knee    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   Stomach ulcer    Tremor, essential    Wears dentures    full upper   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/02/2018    Past Surgical History:  Procedure Laterality Date   ANKLE ARTHROSCOPY Left 09/29/2019   Procedure: LEFT ANKLE ARTHROSCOPY, DEBRIDEMENT;  Surgeon: Nadara Mustard, MD;  Location: [facility];  Service: Orthopedics;  Laterality: Left;   BACK SURGERY     BIOPSY  01/18/2023   Procedure: BIOPSY;  Surgeon: [name], DO;  Location: [facility];  Service: Gastroenterology;;   CATARACT EXTRACTION W/PHACO Right 03/12/2023   Procedure: CATARACT EXTRACTION PHACO AND INTRAOCULAR LENS PLACEMENT (IOC) RIGHT DIABETIC 11.25 01:07.6;  Surgeon: Galen Manila, MD;  Location: [facility];  Service: Ophthalmology;  Laterality: Right;   CATARACT EXTRACTION W/PHACO Left 03/25/2023   Procedure: CATARACT EXTRACTION PHACO AND INTRAOCULAR LENS PLACEMENT (IOC) LEFT DIABETIC 7.15 00:51.0;  Surgeon: Galen Manila, MD;  Location: [facility];  Service: Ophthalmology;  Laterality: Left;   COLONOSCOPY WITH PROPOFOL N/A 09/14/2016   Procedure:  COLONOSCOPY WITH PROPOFOL;  Surgeon: Scot Jun, MD;  Location: [facility];  Service: Endoscopy;  Laterality: N/A;   esophageal stretch     ESOPHAGOGASTRODUODENOSCOPY (EGD) WITH PROPOFOL N/A 12/13/2020   Procedure: ESOPHAGOGASTRODUODENOSCOPY (EGD) WITH PROPOFOL;  Surgeon: Regis Bill, MD;  Location: [facility] END[facility]COPY;  Service: Endoscopy;  Laterality: N/A;  IDDM   ESOPHAGOGASTRODUODENOSCOPY (EGD) WITH PROPOFOL N/A 01/18/2023   Procedure: ESOPHAGOGASTRODUODENOSCOPY (EGD) WITH PROPOFOL;  Surgeon: [name], DO;  Location: [facility] END[facility]COPY;  Service: Gastroenterology;  Laterality: N/A;   JOINT REPLACEMENT Right  2016   knee   LUMBAR LAMINECTOMY/DECOMPRESSION MICRODISCECTOMY Left 02/03/2016   Procedure: LEFT L5-S1 DISKECTOMY;  Surgeon: Hilda Lias, MD;  Location: [facility];  Service: Neurosurgery;  Laterality: Left;  LEFT L5-S1 DISKECTOMY   MALONEY DILATION  01/18/2023   Procedure: MALONEY DILATION;  Surgeon: [name], DO;  Location: [facility];  Service: Gastroenterology;;   PULSE GENERATOR IMPLANT N/A 08/21/2017   Procedure: UNILATERAL PULSE GENERATOR IMPLANT;  Surgeon: Venetia Night, MD;  Location: [facility] ORS;  Service: Neurosurgery;  Laterality: N/A;   PULSE GENERATOR IMPLANT Right 04/02/2018   Procedure: REMOVAL OF PULSE GENERATOR IMPLANT AND LEADS;  Surgeon: Venetia Night, MD;  Location: [facility] ORS;  Service: Neurosurgery;  Laterality: Right;   TONSILLECTOMY     TOTAL KNEE ARTHROPLASTY Right 11/15/2014   Procedure: TOTAL KNEE ARTHROPLASTY;  Surgeon: Salvatore Marvel, MD;  [facility];  Service: Orthopedics;  Laterality: Right;    Family Psychiatric History: Please see initial evaluation for full details. I have reviewed the history. No updates at this time.    Family History:  Family History  Problem Relation Age of Onset   Heart attack Mother    Heart disease Mother    Hypertension Father    Depression Father    Kidney cancer Neg Hx    Prostate cancer Neg Hx     Social History:  Social History   Socioeconomic History   Marital status: Married    Spouse name: Not on file   Number of children: Not on file   Years of education: Not on file   Highest education level: Master's degree (e.g., MA, MS, MEng, MEd, MSW, MBA)  Occupational History   Not on file  Tobacco Use   Smoking status: Every Day    Current packs/day: 0.50    Average packs/day: 0.5 packs/day for 68.2 years (34.1 ttl pk-yrs)    Types: Cigarettes    Start date: 72    Passive exposure: Current   Smokeless tobacco: Never  Vaping Use   Vaping status: Former  Substance and Sexual Activity    Alcohol use: Not Currently    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   Drug use: Never   Sexual activity: Not Currently  Other Topics Concern   Not on file  Social History Narrative   Not on file   Social Drivers of Health   Financial Resource Strain: Medium Risk (05/03/2023)   Overall Financial Resource Strain (CARDIA)    Difficulty of Paying Living Expenses: Somewhat hard  Food Insecurity: No Food Insecurity (05/03/2023)   Hunger Vital Sign    Worried About Running Out of Food in the Last Year: Never true    Ran Out of Food in the Last Year: Never true  Transportation Needs: No Transportation Needs (05/03/2023)   PRAPARE - Administrator, Civil Service (Medical): No    Lack of Transportation (Non-Medical): No  Physical Activity: Insufficiently Active (05/03/2023)  Exercise Vital Sign    Days of Exercise per Week: 1 day    Minutes of Exercise per Session: 30 min  Stress: No Stress Concern Present (05/03/2023)   [employer] of Occupational Health - Occupational Stress Questionnaire    Feeling of Stress : Not at all  Social Connections: Socially Integrated (05/03/2023)   Social Connection and Isolation Panel [NHANES]    Frequency of Communication with Friends and Family: More than three times a week    Frequency of Social Gatherings with Friends and Family: Three times a week    Attends Religious Services: More than 4 times per year    Active Member of Clubs or Organizations: No    Attends Engineer, structural: More than 4 times per year    Marital Status: Married    Allergies: No Known Allergies  Metabolic Disorder Labs: Lab Results  Component Value Date   HGBA1C 7.1 (A) 07/02/2023   HGBA1C 7.1 07/02/2023   HGBA1C 7.1 (A) 07/02/2023   HGBA1C 7.1 (A) 07/02/2023   MPG 131 01/25/2016   MPG 166 11/05/2014   No results found for: "PROLACTIN" Lab Results  Component Value Date   CHOL 119 08/27/2022   TRIG 83.0 08/27/2022   HDL 65.70  08/27/2022   CHOLHDL 2 08/27/2022   VLDL 16.6 08/27/2022   LDLCALC 37 08/27/2022   LDLCALC 57 09/25/2017   Lab Results  Component Value Date   TSH 0.59 01/16/2023   TSH 1.93 12/04/2022    Therapeutic Level Labs: No results found for: "LITHIUM" No results found for: "VALPROATE" No results found for: "CBMZ"  Current Medications: Current Outpatient Medications  Medication Sig Dispense Refill   aspirin EC 81 MG tablet Take 81 mg by mouth daily.     cholestyramine (QUESTRAN) 4 g packet Take 1 packet by mouth 2 (two) times daily.     Continuous Glucose Receiver (FREESTYLE LIBRE 2 READER) DEVI Use to check glucose at least every 8 hours 1 each 0   Continuous Glucose Sensor (FREESTYLE LIBRE 2 SENSOR) MISC Apply every 14 days to check glucose 4 each 3   cyclobenzaprine (FLEXERIL) 10 MG tablet Take 1 tablet (10 mg total) by mouth 3 (three) times daily as needed for muscle spasms. 30 tablet 2   dapagliflozin propanediol (FARXIGA) 10 MG TABS tablet Take 10 mg by mouth daily.     diphenoxylate-atropine (LOMOTIL) 2.5-0.025 MG tablet Take by mouth.     divalproex (DEPAKOTE ER) 500 MG 24 hr tablet Take 1 tablet by mouth 2 (two) times daily.     donepezil (ARICEPT) 10 MG tablet Take 10 mg by mouth at bedtime.     DULoxetine (CYMBALTA) 30 MG capsule Take 2 capsules (60 mg total) by mouth daily. 60 capsule 3   fenofibrate (TRICOR) 145 MG tablet TAKE 1 TABLET BY MOUTH DAILY 90 tablet 1   gabapentin (NEURONTIN) 600 MG tablet TAKE TWO TABLETS BY MOUTH THREE TIMES A DAY 180 tablet 1   lidocaine (LIDODERM) 5 % Place 1 patch onto the skin every 12 (twelve) hours. Remove & Discard patch within 12 hours or as directed by MD 30 patch 2   lisinopril-hydrochlorothiazide (ZESTORETIC) 20-25 MG tablet Take 1 tablet by mouth 2 (two) times daily. 180 tablet 3   memantine (NAMENDA) 10 MG tablet Take 1 tablet by mouth daily.     metFORMIN (GLUCOPHAGE-XR) 500 MG 24 hr tablet TAKE 1 TABLET BY MOUTH 2 TIMES A DAY 180  tablet 1   metoprolol succinate (  TOPROL-XL) 25 MG 24 hr tablet Take 25 mg by mouth daily.     Multiple Vitamins-Minerals (CENTRUM SILVER PO) Take 1 tablet by mouth daily.     nystatin (MYCOSTATIN) 100000 UNIT/ML suspension Take 5 mLs (500,000 Units total) by mouth 4 (four) times daily. Swish in the mouth and retain for as long as possible (several minutes) before swallowing. Use for 7-14 days. 120 mL 0   omega-3 acid ethyl esters (LOVAZA) 1 g capsule TAKE 2 CAPSULES BY MOUTH TWICE A DAY 360 capsule 3   omeprazole (PRILOSEC) 20 MG capsule Take 20 mg by mouth daily.     oxyCODONE-acetaminophen (PERCOCET) 10-325 MG tablet Take 1 tablet by mouth every 6 (six) hours as needed for pain. 120 tablet 0   [START ON 09/01/2023] oxyCODONE-acetaminophen (PERCOCET) 10-325 MG tablet Take 1 tablet by mouth every 6 (six) hours as needed for pain. 120 tablet 0   pantoprazole (PROTONIX) 40 MG tablet Take 40 mg by mouth daily.     pramipexole (MIRAPEX) 0.125 MG tablet TAKE 1 TABLET BY MOUTH EVERY NIGHT 2-3 HOURS BEFORE BEDTIME 90 tablet 1   primidone (MYSOLINE) 50 MG tablet Take 150-250 mg by mouth 2 (two) times daily. Take 250 mg by mouth in the morning & take 150 mg at night.     QUEtiapine (SEROQUEL) 25 MG tablet Take 75 mg by mouth at bedtime.     rosuvastatin (CRESTOR) 40 MG tablet TAKE ONE TABLET BY MOUTH DAILY 90 tablet 3   tamsulosin (FLOMAX) 0.4 MG CAPS capsule TAKE 2 CAPSULES BY MOUTH DAILY AFTER SUPPER 180 capsule 0   TRADJENTA 5 MG TABS tablet TAKE 1 TABLET BY MOUTH DAILY 30 tablet 5   Trospium Chloride 60 MG CP24 Take 1 capsule (60 mg total) by mouth daily. 30 capsule 11   vitamin C (ASCORBIC ACID) 500 MG tablet Take 1,000 mg by mouth daily.     [START ON 08/29/2023] traZODone (DESYREL) 100 MG tablet Take 2 tablets (200 mg total) by mouth at bedtime as needed for sleep. 180 tablet 1   No current facility-administered medications for this visit.     Musculoskeletal: Strength & Muscle Tone: within  normal limits Gait & Station:  using a cane Patient leans: N/A  Psychiatric Specialty Exam: Review of Systems  Psychiatric/Behavioral: Negative.    All other systems reviewed and are negative.   Blood pressure 124/68, pulse (!) 107, temperature 98.2 F (36.8 C), temperature source Temporal, height 5\' 10"  (1.778 m), weight 167 lb 9.6 oz (76 kg), SpO2 94%.Body mass index is 24.05 kg/m.  General Appearance: Well Groomed  Eye Contact:   good  Speech:  Clear and Coherent  Volume:  Normal  Mood:   good  Affect:  Appropriate, Congruent, and Full Range  Thought Process:  Coherent  Orientation:  Full (Time, Place, and Person)  Thought Content: Logical   Suicidal Thoughts:  No  Homicidal Thoughts:  No  Memory:  Immediate;   Good  Judgement:  Good  Insight:  Good  Psychomotor Activity:  Normal  Concentration:  Concentration: Good and Attention Span: Good  Recall:  Good  Fund of Knowledge: Good  Language: Good  Akathisia:  No  Handed:  Right  AIMS (if indicated): not done  Assets:  Communication Skills Desire for Improvement  ADL's:  Intact  Cognition: WNL  Sleep:  Good   Screenings: GAD-7    Flowsheet Row Office Visit from 05/06/2023 in [facility] [employer] Visit from  03/19/2023 in [facility] [employer] Visit from 02/27/2023 in [facility] [employer] Visit from 01/16/2023 in [facility] [employer] Visit from 12/04/2022 in [facility] [employer]  Total GAD-7 Score 11 0 2 5 2       Mini-Mental    Flowsheet Row Clinical Support from 11/15/2017 in [facility] Clinical Support from 11/14/2016 in [facility] [employer]  Total Score (max 30 points ) 30 30      PHQ2-9    Flowsheet Row Procedure visit from 07/08/2023 in [facility] Visit from 06/18/2023 in [facility] Visit from 05[facility]06/2023 in [facility] Procedure visit from 03/27/2023 in [facility] Visit from 03/19/2023 in [facility] Total Score 1 0 4 0 0  PHQ-9 Total Score -- -- 10 -- 0      Flowsheet Row Admission (Discharged) from 03/25/2023 in [facility] Admission (Discharged) from 03/12/2023 in [facility] Admission (Discharged) from 01/18/2023 in [facility]  C-SSRS RISK CATEGORY No Risk No Risk No Risk        Assessment and Plan:  OSTIN MATHEY is a 83 y.o. year old male with a history of  pseudo dementia, sleep disorder, tremor, history of QTc prolongation , who presents for follow up appointment for below.   1. MDD (major depressive disorder), recurrent, in partial remission (HCC) Acute stressors include: ankle pain, moving into a camper due to financial strain  Other stressors include: conflict with his step son, financial strain    History:   James Hood denies any significant mood symptoms since the last visit except some dissatisfaction about the current living environment.  James Hood enjoys the relationship with his wife, and seeing his grandchildren.  Will continue current dose of duloxetine to target depression.  Noted that bupropion is not in his medication list anymore; James Hood will contact the office after James Hood verifies that James Hood takes his medication.  Noted that James Hood is on quetiapine, gabapentin, and Depakote, which have been prescribed by another provider.  May consider tapering off either of the medication to avoid polypharmacy.   2. Insomnia, unspecified type Stable.  Will continue  trazodone as needed for insomnia.  James Hood is aware of his potential risk of drowsiness and fall especially with concomitant use of quetiapine, gabapentin and opioid.     # Alcohol use disorder in sustained remission  History:  James Hood began drinking in college but has been abstinent since the age of 62. James Hood actively participates in [employer] Stable. Although James Hood reports occasional craving, James Hood has been abstinent from alcohol.  Will continue motivational interview.    # History of cognitive impairment Followed-up by neurologist, and has been on memantine and donepezil. 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 Acute risk factors for suicide include: N/A. Protective factors for this patient include: positive social support, responsibility to others (children, family), coping skills and hope for the future. Considering these factors, the overall suicide risk at this point appears to be low. Patient is appropriate for outpatient follow up.    Collaboration of Care: Collaboration of Care: Other reviewed notes in Epic  Patient/Guardian was advised Release of Information must be obtained prior to any record release in order to collaborate their care with an outside provider. Patient/Guardian was  advised if they have not already done so to contact the registration department to sign all necessary forms in order for [place] to release information regarding their care.   Consent: Patient/Guardian gives verbal consent for treatment and assignment of benefits for services provided during this visit. Patient/Guardian expressed understanding and agreed to proceed.    [name], MD 08/20/2023, 3:10 PM

## 2023-08-19 ENCOUNTER — Ambulatory Visit: Payer: HMO | Admitting: Student in an Organized Health Care Education/Training Program

## 2023-08-19 ENCOUNTER — Ambulatory Visit: Payer: PPO | Admitting: Psychiatry

## 2023-08-19 ENCOUNTER — Other Ambulatory Visit: Payer: Self-pay

## 2023-08-19 MED ORDER — LISINOPRIL-HYDROCHLOROTHIAZIDE 20-25 MG PO TABS: 1.0000 | ORAL_TABLET | Freq: Two times a day (BID) | ORAL | 3 refills | Status: DC

## 2023-08-20 ENCOUNTER — Ambulatory Visit (INDEPENDENT_AMBULATORY_CARE_PROVIDER_SITE_OTHER): Payer: PPO | Admitting: Psychiatry

## 2023-08-20 ENCOUNTER — Encounter: Payer: Self-pay | Admitting: Psychiatry

## 2023-08-20 VITALS — BP 124/68 | HR 107 | Temp 98.2°F | Ht 70.0 in | Wt 167.6 lb

## 2023-08-20 DIAGNOSIS — F3341 Major depressive disorder, recurrent, in partial remission: Secondary | ICD-10-CM

## 2023-08-20 DIAGNOSIS — G4700 Insomnia, unspecified: Secondary | ICD-10-CM | POA: Diagnosis not present

## 2023-08-20 MED ORDER — TRAZODONE HCL 100 MG PO TABS: 200.0000 mg | ORAL_TABLET | Freq: Every evening | ORAL | 1 refills | Status: DC | PRN

## 2023-08-20 NOTE — Patient Instructions (Signed)
 Continue bupropion XR 300 mg daily -please contact the office if you need this refill Continue duloxetine 60 mg daily - please contact the office if you need this refill Continue Trazodone 200 mg at night as needed for insomnia  Next appointment: 6/2 at 4 pm

## 2023-08-21 ENCOUNTER — Encounter: Payer: Self-pay | Admitting: Student in an Organized Health Care Education/Training Program

## 2023-08-21 ENCOUNTER — Ambulatory Visit
Payer: HMO | Attending: Student in an Organized Health Care Education/Training Program | Admitting: Student in an Organized Health Care Education/Training Program

## 2023-08-21 VITALS — BP 140/64 | HR 48 | Temp 97.4°F | Resp 18 | Ht 70.0 in | Wt 167.0 lb

## 2023-08-21 DIAGNOSIS — M792 Neuralgia and neuritis, unspecified: Secondary | ICD-10-CM | POA: Diagnosis not present

## 2023-08-21 DIAGNOSIS — M19072 Primary osteoarthritis, left ankle and foot: Secondary | ICD-10-CM

## 2023-08-21 DIAGNOSIS — M5386 Other specified dorsopathies, lumbar region: Secondary | ICD-10-CM

## 2023-08-21 NOTE — Addendum Note (Signed)
 Addended by: Edward Jolly on: 08/21/2023 02:38 PM   Modules accepted: Orders

## 2023-08-21 NOTE — Patient Instructions (Signed)

## 2023-08-21 NOTE — Progress Notes (Signed)
 PROVIDER NOTE: Information contained herein reflects review and annotations entered in association with encounter. Interpretation of such information and data should be left to medically-trained personnel. Information provided to patient can be located elsewhere in the medical record under "Patient Instructions". Document created using STT-dictation technology, any transcriptional errors that may result from process are unintentional.    Patient: James Hood  Service Category: E/M  Provider: Edward Jolly, MD  DOB: June 11, 1940  DOS: 08/21/2023  Referring Provider: Glori Luis, MD  MRN: [id number]  Specialty: Interventional Pain Management  PCP: Bethanie Dicker, NP  Type: Established Patient Encounter  Setting: Ambulatory outpatient    Location: Office  Delivery: Face-to-face     HPI  [name], a 83 y.o. year old male, is here today because of his Sciatica of left side associated with disorder of lumbar spine [M53.86]. James Hood's primary complain today is Ankle Pain (ankle)  Pertinent problems: James Hood has Primary localized osteoarthritis of right knee; DJD (degenerative joint disease) of knee; Lumbar herniated disc; Chronic pain syndrome; Failed back surgical syndrome; Lumbosacral radiculopathy; Primary osteoarthritis of left ankle; Lumbar facet joint syndrome; Neuropathy; Chronic pain of left ankle; and Osteochondral defect of ankle on their pertinent problem list. Pain Assessment: Severity of Chronic pain is reported as a 6 /10. Location: Ankle Left/left calf to the knee. Onset: More than a month ago. Quality: Aching. Timing: Constant. Modifying factor(s): nothing. Vitals:  height is 5\' 10"  (1.778 m) and weight is 167 lb (75.8 kg). His temporal temperature is 97.4 F (36.3 C) (abnormal). His blood pressure is 140/64 (abnormal) and his pulse is 48 (abnormal). His respiration is 18 and oxygen saturation is 98%.  BMI: Estimated body mass index is 23.96 kg/m as calculated from  the following:   Height as of this encounter: 5\' 10"  (1.778 m).   Weight as of this encounter: 167 lb (75.8 kg). Last encounter: 06/18/2023. Last procedure: 07/08/2023.  Reason for encounter: post-procedure evaluation and assessment.   Discussed the use of AI scribe software for clinical note transcription with the patient, who gave verbal consent to proceed.  History of Present Illness   James Hood "James Hood" is an 83 year old male who presents with chronic left ankle neuropathic pain and nociceptive pain and consideration of a peripheral nerve stimulator.  He has chronic ankle pain, which was previously managed with a left popliteal sciatic nerve block that provided temporary numbness and complete pain relief for 4 days. However, he disliked the numbness and experienced foot weakness, necessitating a high-stepping gait to prevent stumbling.  Approximately one and a half weeks ago, he fell and twisted his ankle, resulting in a significant sprain. His ankle was wrapped for four to five days, but it remains swollen and painful. He describes the current pain as significant, stating 'it hurts today.'  He is currently taking 81 mg of aspirin and denies the use of any other blood thinners.       Post-procedure evaluation    Type: Popiteal-Sciatic Nerve block under ultrasound guidance Laterality: Left (-LT)  Level: Superior to popliteal fossa Imaging: Ultrasound-guided Anesthesia: Local anesthesia (1-2% Lidocaine) DOS: 07/08/2023  Performed by: Edward Jolly, MD  Purpose: Diagnostic/Therapeutic Rationale (medical necessity): procedure needed and proper for the diagnosis and/or treatment of James Hood's medical symptoms and needs. 1. Sciatica of left side associated with disorder of lumbar spine    NAS-11 Pain score:   Pre-procedure: 5 /10   Post-procedure: 3 /10    Effectiveness:  Initial hour after procedure: 100 %  Subsequent 4-6 hours post-procedure: 100 %  Analgesia past  initial 6 hours: 100 % (lasted 4 days)  Ongoing improvement:  Analgesic:  back to baseline  ROS  Constitutional: Denies any fever or chills Gastrointestinal: No reported hemesis, hematochezia, vomiting, or acute GI distress Musculoskeletal: Denies any acute onset joint swelling, redness, loss of ROM, or weakness Neurological:  Left ankle pain  Medication Review  DULoxetine, FreeStyle Libre 2 Reader, FreeStyle Libre 2 Sensor, Multiple Vitamins-Minerals, QUEtiapine, Trospium Chloride, ascorbic acid, aspirin EC, cholestyramine, cyclobenzaprine, dapagliflozin propanediol, diphenoxylate-atropine, divalproex, donepezil, fenofibrate, gabapentin, lidocaine, linagliptin, lisinopril-hydrochlorothiazide, memantine, metFORMIN, metoprolol succinate, nystatin, omega-3 acid ethyl esters, omeprazole, oxyCODONE-acetaminophen, pantoprazole, pramipexole, primidone, rosuvastatin, tamsulosin, and traZODone  History Review  Allergy: James Hood has no known allergies. Drug: James Hood  reports no history of drug use. Alcohol:  reports that he does not currently use alcohol. Tobacco:  reports that he has been smoking cigarettes. He started smoking about 68 years ago. He has a 34.1 pack-year smoking history. He has been exposed to tobacco smoke. He has never used smokeless tobacco. Social: [name]  reports that he has been smoking cigarettes. He started smoking about 68 years ago. He has a 34.1 pack-year smoking history. He has been exposed to tobacco smoke. He has never used smokeless tobacco. He reports that he does not currently use alcohol. He reports that he does not use drugs. 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 Family: family history includes Depression in his father; Heart attack in his mother; Heart disease in his mother; Hypertension in his father.  Laboratory Chemistry Profile   Renal Lab Results  Component Value Date   BUN 43 (H) 01/16/2023   CREATININE 1.37 01/16/2023   BCR 23 10/08/2022   GFR 47.89 (L) 01/16/2023   GFRAA >60 09/25/2019   GFRNONAA 55 (L) 06/08/2020    Hepatic Lab Results  Component Value Date   AST 32 12/04/2022   ALT 23 12/04/2022   ALBUMIN 4.0 12/04/2022   ALKPHOS 55 12/04/2022   AMMONIA 14 08/09/2016    Electrolytes Lab Results  Component Value Date   NA 134 (L) 01/16/2023   K 4.3 01/16/2023   CL 98 01/16/2023   CALCIUM 9.6 01/16/2023   MG 1.4 (L) 09/08/2020    Bone Lab Results  Component Value Date   VD25OH 37.09 11/10/2021    Inflammation (CRP: Acute Phase) (ESR: Chronic Phase) Lab Results  Component Value Date    ESRSEDRATE 15 07/20/2019   LATICACIDVEN 1.9 06/08/2020         Note: Above  Lab results reviewed.  Recent Imaging Review  DG PAIN CLINIC C-ARM 1-60 MIN NO REPORT Fluoro was used, but no Radiologist interpretation will be provided.  Please refer to "NOTES" tab for provider progress note. Note: Reviewed        Physical Exam  General appearance: Well nourished, well developed, and well hydrated. In no apparent acute distress Mental status: Alert, oriented x 3 (person, place, & time)       Respiratory: No evidence of acute respiratory distress Eyes: PERLA Vitals: BP (!) 140/64 (Cuff Size: Normal)   Pulse (!) 48   Temp (!) 97.4 F (36.3 C) (Temporal)   Resp 18   Ht 5\' 10"  (1.778 m)   Wt 167 lb (75.8 kg)   SpO2 98%   BMI 23.96 kg/m  BMI: Estimated body mass index is 23.96 kg/m as calculated from the following:   Height as of this encounter: 5\' 10"  (1.778 m).   Weight as of this encounter: 167 lb (75.8 kg). Ideal: Ideal body weight: 73 kg (160 lb 15 oz) Adjusted ideal body weight: 74.1 kg (163 lb 5.8 oz)  Left ankle pain and limited ROM  Assessment   Diagnosis  1. Sciatica of left side associated with disorder of lumbar spine   2. Primary osteoarthritis of left ankle   3. Neuropathic pain of left ankle      Updated Problems: No problems updated.  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 Pain has been persistent, functionally limiting, and consistent with a neuropathic distribution. A diagnostic left popliteal sciatic nerve block was performed, resulting in significant temporary pain relief, confirming the sciatic nerve as the primary pain generator. Given the positive response and refractory nature of symptoms, the patient is now a candidate for peripheral nerve stimulation (PNS) using the SPRINT system targeting  the left popliteal sciatic nerve. I explained to pt how Sprint peripheral nerve stimulation (PNS)  is typically considered for patients with chronic, localized pain that is not responding to conservative treatments such as medications, physical therapy, or injections.  The SPRINT peripheral nerve stimulator is designed for short-term, percutaneous use (approximately 60 days) to modulate pain through targeted nerve stimulation. Unlike traditional permanent implants, SPRINT is temporary but can lead to long-lasting pain relief by altering pain signals.  We discussed the risks and benefits of peripheral nerve stimulation. Benefits: minimally invasive, does not require permanent implantation, can offer significant pain relief, improving function and quality of life, may reduce the need for long-term opioid use. The risks/challenges include (but not limited to):  infection or irritation at the stimulation site, discomfort from electrode placement, risk of incomplete pain relief or temporary relief post-removal, limited to short-term therapy, which may be a disadvantage in chronic, refractory cases  Plan: Proceed with left popliteal sciatic nerve SPRINT PNS trial: Image-guided (ultrasound) placement of the percutaneous PNS lead targeting the sciatic nerve at the popliteal fossa. Outpatient procedure performed under sterile technique. Patient to trial device for up to 60 days to assess for functional and pain improvement. Discussed expectations, risks, and benefits of PNS therapy, including potential for lead migration, infection, or stimulation-related discomfort. If trial is successful and durable pain relief is achieved, consider repeat or transition to alternative long-term neuromodulation strategies depending on goals of care. Continue baseline medications as tolerated, with goal to reduce reliance if pain relief is achieved. Follow up within 1  week post-placement for lead check and ongoing  management.    Orders:  Orders Placed This Encounter  Procedures   Implantable Peripheral Nerve Stimulator    Standing Status:   Future    Expiration Date:   11/20/2023    Scheduling Instructions:     Procedure: Temporary implantable peripheral nerve stimulator     Side:  LEFT     Nerve site: Popliteal-sciatic     Sedation: IV Versed     Timeframe: ASAA    Location of Procedure:   [facility]   Follow-up plan:   Return in about 3 weeks (around 09/11/2023) for Left popliteal-sciatic SPRINT PNS, in clinic IV Versed.      Left L4, L5 transforaminal ESI 02/20/2023, 03/27/23    Recent Visits Date Type Provider Dept  07/08/23 Procedure visit Edward Jolly, MD [facility]  06/18/23 Office Visit Edward Jolly, MD [facility]  Showing recent visits within past 90 days and meeting all other requirements Today's Visits Date Type Provider Dept  08/21/23 Office Visit Edward Jolly, MD [facility]  Showing today's visits and meeting all other requirements Future Appointments Date Type Provider Dept  09/24/23 Appointment Edward Jolly, MD [facility]  Showing future appointments within next 90 days and meeting all other requirements  I discussed the assessment and treatment plan with the patient. The patient was provided an opportunity to ask questions and all were answered. The patient agreed with the plan and demonstrated an understanding of the instructions.  Patient advised to call back or seek an in-person evaluation if the symptoms or condition worsens.  Duration of encounter: .  Total time on encounter, as per AMA guidelines included both the face-to-face and non-face-to-face time personally spent by the physician and/or other qualified health care professional(s) on the day of the encounter (includes time in activities that require the physician or other qualified health care professional and does not include time in  activities normally performed by clinical staff). Physician's time may include the following activities when performed: Preparing to see the patient (e.g., pre-charting review of records, searching for previously ordered imaging, lab work, and nerve conduction tests) Review of prior analgesic pharmacotherapies. Reviewing PMP Interpreting ordered tests (e.g., lab work, imaging, nerve conduction tests) Performing post-procedure evaluations, including interpretation of diagnostic procedures Obtaining and/or reviewing separately obtained history Performing a medically appropriate examination and/or evaluation Counseling and educating the patient/family/caregiver Ordering medications, tests, or procedures Referring and communicating with other health care professionals (when not separately reported) Documenting clinical information in the electronic or other health record Independently interpreting results (not separately reported) and communicating results to the patient/ family/caregiver Care coordination (not separately reported)  Note by: Edward Jolly, MD Date: 08/21/2023; Time: 2:19 PM

## 2023-08-23 ENCOUNTER — Telehealth: Payer: Self-pay | Admitting: Psychiatry

## 2023-08-23 NOTE — Telephone Encounter (Signed)
 He was previously advised to confirm whether he is still taking bupropion. Could you please verify with him that he has been taking this medication? If so, I will send a refill to the pharmacy.

## 2023-08-23 NOTE — Telephone Encounter (Signed)
 Thank you for verifying this. In that case, I will hold off on restarting the medication for now.

## 2023-08-23 NOTE — Telephone Encounter (Signed)
 called pt and he states he hasn;t been taking medications for a couple months. Asked why he was requesting now and he states that he just forgot to get refills

## 2023-08-23 NOTE — Telephone Encounter (Signed)
 called pharmacy spoke with susan and she stated that the last time he had filled was may 26th 2024 for a 90 day supply

## 2023-08-23 NOTE — Telephone Encounter (Signed)
 Patient states he needs a refill on the medication you asked him to check on. He cannot think of it but thinks it is bupropion. Says that you will know what it is. Please advise and send to [facility] on [address].

## 2023-08-26 ENCOUNTER — Other Ambulatory Visit: Payer: Self-pay

## 2023-08-26 ENCOUNTER — Ambulatory Visit (INDEPENDENT_AMBULATORY_CARE_PROVIDER_SITE_OTHER): Admitting: Licensed Clinical Social Worker

## 2023-08-26 DIAGNOSIS — F3341 Major depressive disorder, recurrent, in partial remission: Secondary | ICD-10-CM

## 2023-08-26 DIAGNOSIS — E785 Hyperlipidemia, unspecified: Secondary | ICD-10-CM

## 2023-08-26 MED ORDER — ROSUVASTATIN CALCIUM 40 MG PO TABS: 40.0000 mg | ORAL_TABLET | Freq: Every day | ORAL | 3 refills | Status: DC

## 2023-08-26 NOTE — Progress Notes (Signed)
 Comprehensive Clinical Assessment (CCA) Note  08/26/2023 James Hood [id number]  Chief Complaint: No chief complaint on file.  Visit Diagnosis: MDD (major depressive disorder), recurrent, in partial remission (HCC)  The patient reports experiencing functional impairments related to various areas, including difficulties with memory, concentration, and problem-solving; and difficulties in regulating mood and affect.    CCA Biopsychosocial Intake/Chief Complaint:  SEQUAN Hood is a 83 y.o. year old male who presents alone to [facility] to establish care with therapist.  Patient was referred by psychiatrist, Dr. Vanetta Shawl.  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  Current Symptoms/Problems: SXs to include: Little interest, fatigue, negative self affect, trouble concentrating, anxious feelings, uncontrollable worry, irritability.  "I'm not depressed everyday, but when he thinks about regrets in life her experiences low mood."   Patient Reported Schizophrenia/Schizoaffective Diagnosis in Past: No   Strengths: hard worker, family oriented  Preferences: No data recorded Abilities: communicates well, understands, hardworker   Type of Services Patient Feels are Needed: Individual outpatient therapy   Initial Clinical Notes/Concerns: No data recorded  Mental Health Symptoms Depression:  Change in energy/activity; Difficulty Concentrating; Fatigue; Irritability   Duration of Depressive symptoms: Greater than two weeks   Mania:  None   Anxiety:   Difficulty concentrating; Irritability; Worrying   Psychosis:  None   Duration of Psychotic symptoms: No data recorded  Trauma:  None   Obsessions:  None   Compulsions:  None   Inattention:  None   Hyperactivity/Impulsivity:  None   Oppositional/Defiant Behaviors:  Angry; Argumentative; Temper   Emotional Irregularity:  Mood lability   Other Mood/Personality Symptoms:  No data recorded    Mental Status Exam Appearance and self-care  Stature:  Average   Weight:  Average weight   Clothing:  Casual   Grooming:  Normal   Cosmetic use:  None   Posture/gait:  Normal   Motor activity:  Not Remarkable   Sensorium  Attention:  Normal   Concentration:  Anxiety interferes   Orientation:  X5   Recall/memory:  Normal   Affect and Mood  Affect:  Appropriate   Mood:  Anxious; Angry   Relating  Eye contact:  Normal   Facial expression:  Responsive   Attitude toward examiner:  Cooperative   Thought and Language  Speech flow: Clear and Coherent   Thought content:  Appropriate to Mood and Circumstances   Preoccupation:  None   Hallucinations:  None   Organization:  No data recorded  [employer] of Knowledge:  Good   Intelligence:  Above Average   Abstraction:  Normal   Judgement:  Normal   Reality Testing:  Adequate   Insight:  Good   Decision Making:  Normal   Social Functioning  Social Maturity:  Responsible   Social Judgement:  Normal   Stress  Stressors:  Family conflict; Transitions   Coping Ability:  Overwhelmed   Skill Deficits:  Activities of daily living; Communication; Interpersonal; Self-control   Supports:  Family     Religion: Religion/Spirituality Are You A Religious Person?: Yes What is Your Religious Affiliation?: Christian  Leisure/Recreation:    Exercise/Diet: Exercise/Diet Do You Exercise?: No Have You Gained or Lost A Significant Amount of Weight in the Past Six Months?: No Do You Follow a Special Diet?: No Do You Have Any Trouble Sleeping?: Yes Explanation of Sleeping Difficulties: takes sleeping pills (Seroquel)   CCA Employment/Education Employment/Work Situation: Employment / Work Psychologist, occupational Employment Situation: Retired  Patient's Job has Been Impacted by Current Illness: No What is the Longest Time Patient has Held a Job?: 39yrs 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?: No  Education: Education Name of [employer] School: [employer] in [place] Did You Graduate From [employer]?: Yes Did Theme park manager?: Yes What Type of College Degree Do you Have?: BA in Investment banker, corporate Pre-Law in 1974 Did You Attend Graduate School?: No Did You Have An Individualized Education Program (IIEP): No Did You Have Any Difficulty At [employer]?: No Patient's Education Has Been Impacted by Current Illness: No   CCA Family/Childhood History Family and Relationship History: Family history Marital status: Married Number of Years Married: 22 What types of issues is patient dealing with in the relationship?: financial What is your sexual orientation?: heterosexual Does patient have children?: Yes How many children?: 2 How is patient's relationship with their children?: Two daughters; Close to her and supports her endevours. Lives on the same propoerty as her.  Childhood History:  Childhood History By whom was/is the patient raised?: Both parents Additional childhood history information: Per previous CCA: "limited income as a child, teased as a child, fearful of his father, mother loving" Shares he recived little positve feedback and was often critiqued by his father. Shares alcoholism runs int he family. Description of patient's relationship with caregiver when they were a child: Per previous CCA:"fearful of father; mother loving" How were you disciplined when you got in trouble as a child/adolescent?: Per previous CCA: "timeout" Does patient have siblings?: Yes Description of patient's current relationship with siblings: One brother Did patient suffer any verbal/emotional/physical/sexual abuse as a child?: No Did patient suffer from severe childhood neglect?: No Has patient ever been sexually abused/assaulted/raped as an adolescent or adult?: No Was the patient ever a victim of a  crime or a disaster?: No Witnessed domestic violence?: No Has patient been affected by domestic violence as an adult?: No  Child/Adolescent Assessment:     CCA Substance Use Alcohol/Drug Use: Alcohol / Drug Use Pain Medications: See MAR Prescriptions: See MAR Over the Counter: See MAR History of alcohol / drug use?: Yes Substance #1 Name of Substance 1: Alcohol 1 - Age of First Use: 18 1 - Frequency: Daily 1 - Duration: 27 years 1 - Last Use / Amount: 38 years ago 1- Route of Use: orally                       ASAM's:  Six Dimensions of Multidimensional Assessment  Dimension 1:  Acute Intoxication and/or Withdrawal Potential:      Dimension 2:  Biomedical Conditions and Complications:      Dimension 3:  Emotional, Behavioral, or Cognitive Conditions and Complications:     Dimension 4:  Readiness to Change:     Dimension 5:  Relapse, Continued use, or Continued Problem Potential:     Dimension 6:  Recovery/Living Environment:     ASAM Severity Score:    ASAM Recommended Level of Treatment:     Substance use Disorder (SUD)    Recommendations for Services/Supports/Treatments: Recommendations for Services/Supports/Treatments Recommendations For Services/Supports/Treatments: Individual Therapy  DSM5 Diagnoses: Patient Active Problem List   Diagnosis Date Noted   Thrush 05/06/2023   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) 01/31/2023   Sciatica of left side associated with disorder of lumbar spine 01/31/2023  Irregular heart beat 01/16/2023   Abnormal breath sounds 12/04/2022   Right shoulder pain 12/04/2022   Tinnitus 08/27/2022   Peroneal tendonitis, left 08/16/2022   Peroneal tendinitis, right 07/20/2022   Numbness and tingling in left hand 07/11/2022   Hypoxia 06/27/2022   Hypersomnia 05/24/2022   Chronic cough 11/10/2021   Pressure injury of buttock, stage 1 08/08/2021   RLS (restless legs syndrome) 06/26/2021    Bilateral leg pain 06/26/2021   Primary osteoarthritis of left shoulder 01/25/2021   Chronic, continuous use of opioids 11/15/2020   Therapeutic opioid-induced constipation (OIC) 11/15/2020   Fatigue 10/11/2020   OAB (overactive bladder) 08/08/2020   Prolonged Q-T interval on ECG 06/08/2020   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   Osteochondral defect of ankle    Weight loss 07/23/2019   Chronic pain of left ankle 02/27/2019   Vitamin D deficiency 02/18/2019   Chronic left shoulder pain 02/02/2019   Neuropathy 09/24/2018   Lumbosacral radiculopathy 01/14/2018   Primary osteoarthritis of left ankle 01/14/2018   Lumbar facet joint syndrome 01/14/2018   Chronic pain syndrome 08/26/2017   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   Thyroid nodule 02/27/2017   Fatty liver 02/27/2017   Right hip pain 08/16/2016   Lumbar herniated disc 02/03/2016   Anemia 12/09/2015   Falls 09/16/2015   Barrett's esophagus 09/16/2015   Encounter for general adult medical examination with abnormal findings 09/16/2015   Benign prostatic hyperplasia 09/16/2015   Trochanteric bursitis of left hip 09/09/2015   Difficulty in walking 06/01/2015   HLD (hyperlipidemia) 02/23/2015   DJD (degenerative joint disease) of knee 11/15/2014   Hypertension    Tremor, essential    Primary localized osteoarthritis of right knee    Anxiety and depression    Tobacco abuse 11/18/2013   Chronic diarrhea 11/12/2013   Benign neoplasm of colon 08/25/2013   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 year old male who presents alone to [facility] to establish care with therapist.  Patient was referred by psychiatrist, Dr. Vanetta Shawl.  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  Patient reports misplaced guilt related to difficulties managing money and the implications to his personal relationships. Shares "I'm not depressed everyday," but  when he thinks about regrets in life he experiences low mood. Identifies a purpose as an AA sponsor citing 38 years of sobriety.    Therapeutic goal: struggled to identify goals stating he needs someone to talk to.   Patient Centered Plan: Patient is on the following Treatment Plan(s):  Pt was referred out of the office.    Referrals to Alternative Service(s): Referred to Alternative Service(s):   Place:   Date:   Time:    Referred to Alternative Service(s):   Place:   Date:   Time:    Referred to Alternative Service(s):   Place:   Date:   Time:    Referred to Alternative Service(s):   Place:   Date:   Time:      Collaboration of Care: Referral or follow-up with counselor/therapist AEB patient was provided with information regarding other therapeutic services in the [place] area based on his desire to obtain therapeutic care long-term for additional support.  Patient/Guardian was advised Release of Information must be obtained prior to any record release in order to collaborate their care with an outside provider. Patient/Guardian was advised if they have not already done so to contact the  registration department to sign all necessary forms in order for [place] to release information regarding their care.   Consent: Patient/Guardian gives verbal consent for treatment and assignment of benefits for services provided during this visit. Patient/Guardian expressed understanding and agreed to proceed.   Dereck Leep, LCSW

## 2023-09-02 ENCOUNTER — Other Ambulatory Visit: Payer: Self-pay

## 2023-09-02 DIAGNOSIS — M19072 Primary osteoarthritis, left ankle and foot: Secondary | ICD-10-CM

## 2023-09-02 DIAGNOSIS — M7918 Myalgia, other site: Secondary | ICD-10-CM

## 2023-09-02 MED ORDER — DULOXETINE HCL 30 MG PO CPEP: 60.0000 mg | ORAL_CAPSULE | Freq: Every day | ORAL | 3 refills | Status: DC

## 2023-09-04 ENCOUNTER — Other Ambulatory Visit: Payer: Self-pay | Admitting: Nurse Practitioner

## 2023-09-04 DIAGNOSIS — B370 Candidal stomatitis: Secondary | ICD-10-CM

## 2023-09-04 MED ORDER — NYSTATIN 100000 UNIT/ML MT SUSP: 5.0000 mL | Freq: Four times a day (QID) | OROMUCOSAL | 0 refills | Status: DC

## 2023-09-04 NOTE — Telephone Encounter (Signed)
 Last Fill: 05/06/23  Last OV: 07/02/23 Next OV: 09/30/23  Routing to provider for review/authorization.   Copied from CRM [id number]. Topic: Clinical - Medication Refill >> Sep 04, 2023  1:49 PM Annelle Kiel wrote: Most Recent Primary Care Visit:  Provider: Bluford Burkitt  Department: [facility]  Visit Type: TRANSFER OF CARE  Date: 07/02/2023  Medication: nystatin  Has the patient contacted their pharmacy? Yes (Agent: If no, request that the patient contact the pharmacy for the refill. If patient does not wish to contact the pharmacy document the reason why and proceed with request.) (Agent: If yes, when and what did the pharmacy advise?)  Is this the correct pharmacy for this prescription? Yes If no, delete pharmacy and type the correct one.  This is the patient's preferred pharmacy:  [facility] [id number] [place], [place] - [address] [place] [address] Phone: [phone] Fax: [phone]   Has the prescription been filled recently? No  Is the patient out of the medication? Yes  Has the patient been seen for an appointment in the last year OR does the patient have an upcoming appointment? Yes  Can we respond through MyChart? No  Agent: Please be advised that Rx refills may take up to 3 business days. We ask that you follow-up with your pharmacy.

## 2023-09-23 NOTE — Progress Notes (Unsigned)
 PROVIDER NOTE: Interpretation of information contained herein should be left to medically-trained personnel. Specific patient instructions are provided elsewhere under "Patient Instructions" section of medical record. This document was created in part using AI and STT-dictation technology, any transcriptional errors that may result from this process are unintentional.  Patient: James Hood  Service: E/M   PCP: Bluford Burkitt, NP  DOB: 06/26/1940  DOS: 09/24/2023  Provider: Cherylin Corrigan, NP  MRN: [id number]  Delivery: Face-to-face  Specialty: Interventional Pain Management  Type: Established Patient  Setting: Ambulatory outpatient facility  Specialty designation: 09  Referring Prov.: Kent Pear, MD  Location: Outpatient office facility       HPI  James Hood, a 83 y.o. year old male, is here today because of his Sciatica of left side associated with disorder of lumbar spine [M53.86]. Mr. Meth's primary complain today is No chief complaint on file.   Pain Assessment: Severity of   is reported as a  /10. Location:    / . Onset:  . Quality:  . Timing:  . Modifying factor(s):  Aaron Aas Vitals:  vitals were not taken for this visit.  BMI: Estimated body mass index is 23.96 kg/m as calculated from the following:   Height as of 08/21/23: 5\' 10"  (1.778 m).   Weight as of 08/21/23: 167 lb (75.8 kg). Last encounter: 08/21/2023.  Reason for encounter: medication management. ***  Discussed the use of AI scribe software for clinical note transcription with the patient, who gave verbal consent to proceed.  History of Present Illness           Pharmacotherapy Assessment  Analgesic: {There is no content from the last Subjective section.}   Monitoring: [place] PMP: PDMP reviewed during this encounter.       Pharmacotherapy: No side-effects or adverse reactions reported. Compliance: No problems identified. Effectiveness: Clinically acceptable.  No notes on file  No results found for:  "CBDTHCR" No results found for: "D8THCCBX" No results found for: "D9THCCBX"  UDS:  Summary  Date Value Ref Range Status  12/20/2022 Note  Final    Comment:    ==================================================================== [employer] [facility] [employer] (MW) ==================================================================== Test                             Result       Flag       Units  Drug Present and Declared for Prescription Verification   Oxymorphone                    1238         EXPECTED   ng/mg creat   Noroxycodone                   2046         EXPECTED   ng/mg creat   Noroxymorphone                 575          EXPECTED   ng/mg creat    Oxymorphone, noroxycodone and noroxymorphone are expected    metabolites of oxycodone . Noroxymorphone is an expected metabolite    of oxymorphone. Sources of oxycodone  and/or oxymorphone include    scheduled prescription medications.    Phenobarbital                  PRESENT      EXPECTED    Phenobarbital is an expected  metabolite of primidone ; Phenobarbital    may also be administered as a prescription drug.  Drug Absent but Declared for Prescription Verification   Oxycodone                       Not Detected UNEXPECTED ng/mg creat    Oxycodone  is almost always present in patients taking this drug    consistently.  Absence of oxycodone  could be due to lapse of time    since the last dose or unusual pharmacokinetics (rapid metabolism).  ==================================================================== Test                      Result    Flag   Units      Ref Range   Creatinine              24               mg/dL      >=16 ==================================================================== Declared Medications:  The flagging and interpretation on this report are based on the  following declared medications.  Unexpected results may arise from  inaccuracies in the declared medications.   **Note: The testing scope of this panel includes  these medications:   Oxycodone  (Percocet)  Primidone  (Mysoline )   **Note: The testing scope of this panel does not include the  following reported medications:   Acetaminophen  (Percocet)  Aspirin   Atropine  (Lomotil )  Bupropion  (Wellbutrin  XL)  Cholestyramine (Questran)  Ciclopirox   Dapagliflozin  (Farxiga )  Diphenoxylate  (Lomotil )  Divaleproex (Depakote )  Donepezil  (Aricept )  Duloxetine  (Cymbalta )  Empagliflozin  (Jardiance )  Fenofibrate  (TriCor )  Gabapentin  (Neurontin )  Hydrochlorothiazide  (Zestoretic )  Linagliptin  (Tradjenta )  Lisinopril  (Zestoretic )  Memantine  (Namenda )  Metformin  (Glucophage )  Metoprolol  (Toprol )  Multivitamin  Omega-3 Fatty Acids  Omeprazole  (Prilosec )  Pantoprazole  (Protonix )  Pramipexole  (Mirapex )  Quetiapine  (Seroquel )  Rosuvastatin  (Crestor )  Supplement  Tamsulosin  (Flomax )  Topical Lidocaine  (Lidoderm )  Trazodone  (Desyrel )  Vitamin C ==================================================================== For clinical consultation, please call [phone]. ====================================================================       ROS  Constitutional: Denies any fever or chills Gastrointestinal: No reported hemesis, hematochezia, vomiting, or acute GI distress Musculoskeletal: Denies any acute onset joint swelling, redness, loss of ROM, or weakness Neurological: No reported episodes of acute onset apraxia, aphasia, dysarthria, agnosia, amnesia, paralysis, loss of coordination, or loss of consciousness  Medication Review  DULoxetine , FreeStyle Libre 2 Reader, [employer] 2 Sensor, Multiple Vitamins-Minerals, QUEtiapine , Trospium  Chloride, ascorbic acid, aspirin  EC, cholestyramine, cyclobenzaprine , dapagliflozin  propanediol, diphenoxylate -atropine , divalproex , donepezil , fenofibrate , gabapentin , lidocaine , linagliptin , lisinopril -hydrochlorothiazide , memantine , metFORMIN , metoprolol  succinate, nystatin , omega-3 acid ethyl esters,  omeprazole , oxyCODONE -acetaminophen , pantoprazole , pramipexole , primidone , rosuvastatin , tamsulosin , and traZODone   History Review  Allergy: Mr. Stegman has no known allergies. Drug: Mr. Mccommon  reports no history of drug use. Alcohol :  reports that he does not currently use alcohol . Tobacco:  reports that he has been smoking cigarettes. He started smoking about 68 years ago. He has a 34.2 pack-year smoking history. He has been exposed to tobacco smoke. He has never used smokeless tobacco. Social: Mr. Sevilla  reports that he has been smoking cigarettes. He started smoking about 68 years ago. He has a 34.2 pack-year smoking history. He has been exposed to tobacco smoke. He has never used smokeless tobacco. He reports that he does not currently use alcohol . He reports that he does not use drugs. 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 Surgical: Mr. 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 Family: family history includes Depression in his father; Heart attack in his mother; Heart disease in his mother; Hypertension in his father.  Laboratory Chemistry Profile   Renal Lab Results  Component Value Date   BUN 43 (H) 01/16/2023   CREATININE 1.37 01/16/2023   BCR 23 10/08/2022   GFR 47.89 (L) 01/16/2023   GFRAA >60 09/25/2019   GFRNONAA 55 (L) 06/08/2020    Hepatic Lab Results  Component Value Date   AST 32 12/04/2022   ALT 23 12/04/2022   ALBUMIN 4.0 12/04/2022   ALKPHOS 55 12/04/2022   AMMONIA 14 08/09/2016    Electrolytes Lab Results  Component Value Date   NA 134 (L) 01/16/2023   K 4.3 01/16/2023   CL 98 01/16/2023   CALCIUM  9.6 01/16/2023   MG 1.4 (L) 09/08/2020    Bone Lab Results  Component Value Date   VD25OH 37.09 11/10/2021    Inflammation (CRP: Acute Phase) (ESR: Chronic Phase) Lab Results  Component Value Date   ESRSEDRATE 15 07/20/2019   LATICACIDVEN 1.9 06/08/2020         Note: Above Lab results reviewed.  Recent Imaging Review  DG PAIN CLINIC C-ARM 1-60 MIN NO REPORT Fluoro was used, but no Radiologist interpretation will be provided.  Please refer to "NOTES" tab for provider progress note. Note: Reviewed        Physical Exam  General appearance: Well nourished, well developed, and well hydrated. In no apparent acute distress Mental status: Alert, oriented x 3 (person, place, & time)       Respiratory: No evidence of acute respiratory distress Eyes: PERLA Vitals: There were no vitals taken for this visit. BMI: Estimated body mass index is 23.96 kg/m as calculated from the following:   Height as of 08/21/23: 5\' 10"  (1.778 m).   Weight as of 08/21/23: 167 lb  (75.8 kg). Ideal: Patient weight not recorded  Assessment   Diagnosis Status  1. Sciatica of left side associated with disorder of lumbar spine   2. Neuropathic pain of left ankle   3. Lumbosacral radiculopathy   4. Chronic pain of left ankle   5. Lumbar facet joint syndrome   6. Chronic pain syndrome   7. Medication management    Controlled Controlled Controlled   Plan of Care  Assessment and Plan             Pharmacotherapy (Medications Ordered): No orders of the defined types were placed in this encounter.  Orders:  No orders of the defined types were placed in this encounter.  Follow-up plan:   No follow-ups on file.    Recent Visits Date Type Provider Dept  08/21/23 Office Visit Cephus Collin, MD  [facility]  07/08/23 Procedure visit Cephus Collin, MD [facility]  Showing recent visits within past 90 days and meeting all other requirements Future Appointments Date Type Provider Dept  09/24/23 Appointment Tauri Ethington K, NP [facility]  10/16/23 Appointment Cephus Collin, MD [facility]  Showing future appointments within next 90 days and meeting all other requirements  I discussed the assessment and treatment plan with the patient. The patient was provided an opportunity to ask questions and all were answered. The patient agreed with the plan and demonstrated an understanding of the instructions.  Patient advised to call back or seek an in-person evaluation if the symptoms or condition worsens.  Duration of encounter: *** minutes.  Total time on encounter, as per AMA guidelines included both the face-to-face and non-face-to-face time personally spent by the physician and/or other qualified health care professional(s) on the day of the encounter (includes time in activities that require the physician or other qualified health care professional and does not include time in activities normally performed by clinical staff).  Physician's time may include the following activities when performed: Preparing to see the patient (e.g., pre-charting review of records, searching for previously ordered imaging, lab work, and nerve conduction tests) Review of prior analgesic pharmacotherapies. Reviewing PMP Interpreting ordered tests (e.g., lab work, imaging, nerve conduction tests) Performing post-procedure evaluations, including interpretation of diagnostic procedures Obtaining and/or reviewing separately obtained history Performing a medically appropriate examination and/or evaluation Counseling and educating the patient/family/caregiver Ordering medications, tests, or procedures Referring and communicating with other health care professionals (when not separately reported) Documenting clinical information in the electronic or other health record Independently interpreting results (not separately reported) and communicating results to the patient/ family/caregiver Care coordination (not separately reported)  Note by: Shateria Paternostro K Arriyanna Mersch, NP (TTS and AI technology used. I apologize for any typographical errors that were not detected and corrected.) Date: 09/24/2023; Time: 2:27 PM

## 2023-09-24 ENCOUNTER — Ambulatory Visit: Payer: PPO | Attending: Student in an Organized Health Care Education/Training Program | Admitting: Nurse Practitioner

## 2023-09-24 ENCOUNTER — Encounter: Payer: Self-pay | Admitting: Nurse Practitioner

## 2023-09-24 VITALS — BP 120/52 | HR 80 | Temp 97.2°F | Ht 70.0 in | Wt 165.0 lb

## 2023-09-24 DIAGNOSIS — F1190 Opioid use, unspecified, uncomplicated: Secondary | ICD-10-CM | POA: Insufficient documentation

## 2023-09-24 DIAGNOSIS — G894 Chronic pain syndrome: Secondary | ICD-10-CM | POA: Insufficient documentation

## 2023-09-24 DIAGNOSIS — M47816 Spondylosis without myelopathy or radiculopathy, lumbar region: Secondary | ICD-10-CM | POA: Diagnosis present

## 2023-09-24 DIAGNOSIS — M5386 Other specified dorsopathies, lumbar region: Secondary | ICD-10-CM | POA: Insufficient documentation

## 2023-09-24 DIAGNOSIS — G8929 Other chronic pain: Secondary | ICD-10-CM | POA: Insufficient documentation

## 2023-09-24 DIAGNOSIS — M792 Neuralgia and neuritis, unspecified: Secondary | ICD-10-CM | POA: Diagnosis present

## 2023-09-24 DIAGNOSIS — Z79899 Other long term (current) drug therapy: Secondary | ICD-10-CM | POA: Insufficient documentation

## 2023-09-24 DIAGNOSIS — M5417 Radiculopathy, lumbosacral region: Secondary | ICD-10-CM | POA: Diagnosis not present

## 2023-09-24 DIAGNOSIS — Z79891 Long term (current) use of opiate analgesic: Secondary | ICD-10-CM

## 2023-09-24 DIAGNOSIS — M25572 Pain in left ankle and joints of left foot: Secondary | ICD-10-CM | POA: Insufficient documentation

## 2023-09-24 DIAGNOSIS — M19072 Primary osteoarthritis, left ankle and foot: Secondary | ICD-10-CM | POA: Diagnosis present

## 2023-09-24 DIAGNOSIS — T85192S Other mechanical complication of implanted electronic neurostimulator (electrode) of spinal cord, sequela: Secondary | ICD-10-CM | POA: Diagnosis present

## 2023-09-24 MED ORDER — OXYCODONE-ACETAMINOPHEN 10-325 MG PO TABS
1.0000 | ORAL_TABLET | Freq: Four times a day (QID) | ORAL | 0 refills | Status: DC | PRN
Start: 2023-12-02 — End: 2024-01-02

## 2023-09-24 MED ORDER — OXYCODONE-ACETAMINOPHEN 10-325 MG PO TABS: 1.0000 | ORAL_TABLET | Freq: Four times a day (QID) | ORAL | 0 refills | Status: AC | PRN

## 2023-09-24 NOTE — Progress Notes (Signed)
 Safety precautions to be maintained throughout the outpatient stay will include: orient to surroundings, keep bed in low position, maintain call bell within reach at all times, provide assistance with transfer out of bed and ambulation.   Nursing Pain Medication Assessment:  Safety precautions to be maintained throughout the outpatient stay will include: orient to surroundings, keep bed in low position, maintain call bell within reach at all times, provide assistance with transfer out of bed and ambulation.  Medication Inspection Compliance: Pill count conducted under aseptic conditions, in front of the patient. Neither the pills nor the bottle was removed from the patient's sight at any time. Once count was completed pills were immediately returned to the patient in their original bottle.  Medication: Oxycodone /APAP Pill/Patch Count:  44 of 120 pills remain Pill/Patch Appearance: Markings consistent with prescribed medication Bottle Appearance: Standard pharmacy container. Clearly labeled. Filled Date: 4 / 49 / 2025 Last Medication intake:  Today

## 2023-09-30 ENCOUNTER — Encounter: Payer: Self-pay | Admitting: Nurse Practitioner

## 2023-09-30 ENCOUNTER — Ambulatory Visit (INDEPENDENT_AMBULATORY_CARE_PROVIDER_SITE_OTHER): Payer: PPO | Admitting: Nurse Practitioner

## 2023-09-30 VITALS — BP 120/60 | HR 42 | Temp 97.8°F | Ht 70.0 in | Wt 167.2 lb

## 2023-09-30 DIAGNOSIS — E1142 Type 2 diabetes mellitus with diabetic polyneuropathy: Secondary | ICD-10-CM | POA: Diagnosis not present

## 2023-09-30 DIAGNOSIS — I10 Essential (primary) hypertension: Secondary | ICD-10-CM | POA: Diagnosis not present

## 2023-09-30 DIAGNOSIS — F32A Depression, unspecified: Secondary | ICD-10-CM

## 2023-09-30 DIAGNOSIS — E785 Hyperlipidemia, unspecified: Secondary | ICD-10-CM | POA: Diagnosis not present

## 2023-09-30 DIAGNOSIS — F419 Anxiety disorder, unspecified: Secondary | ICD-10-CM

## 2023-09-30 DIAGNOSIS — M25572 Pain in left ankle and joints of left foot: Secondary | ICD-10-CM

## 2023-09-30 DIAGNOSIS — R001 Bradycardia, unspecified: Secondary | ICD-10-CM | POA: Insufficient documentation

## 2023-09-30 DIAGNOSIS — G8929 Other chronic pain: Secondary | ICD-10-CM

## 2023-09-30 LAB — COMPREHENSIVE METABOLIC PANEL WITH GFR
ALT: 14 U/L (ref 0–53)
AST: 22 U/L (ref 0–37)
Albumin: 3.9 g/dL (ref 3.5–5.2)
Alkaline Phosphatase: 50 U/L (ref 39–117)
BUN: 31 mg/dL — ABNORMAL HIGH (ref 6–23)
CO2: 29 meq/L (ref 19–32)
Calcium: 9.6 mg/dL (ref 8.4–10.5)
Chloride: 99 meq/L (ref 96–112)
Creatinine, Ser: 1.41 mg/dL (ref 0.40–1.50)
GFR: 46.03 mL/min — ABNORMAL LOW (ref >60.00)
Glucose, Bld: 205 mg/dL — ABNORMAL HIGH (ref 70–99)
Potassium: 4.5 meq/L (ref 3.5–5.1)
Sodium: 136 meq/L (ref 135–145)
Total Bilirubin: 0.3 mg/dL (ref 0.2–1.2)
Total Protein: 6.1 g/dL (ref 6.0–8.3)

## 2023-09-30 LAB — LIPID PANEL
Cholesterol: 118 mg/dL (ref 0–200)
HDL: 46.8 mg/dL (ref >39.00)
LDL Cholesterol: 28 mg/dL (ref 0–99)
NonHDL: 71.3
Total CHOL/HDL Ratio: 3
Triglycerides: 217 mg/dL — ABNORMAL HIGH (ref 0.0–149.0)
VLDL: 43.4 mg/dL — ABNORMAL HIGH (ref 0.0–40.0)

## 2023-09-30 LAB — HEMOGLOBIN A1C: Hgb A1c MFr Bld: 7.3 % — ABNORMAL HIGH (ref 4.6–6.5)

## 2023-09-30 MED ORDER — BUPROPION HCL ER (XL) 150 MG PO TB24: 150.0000 mg | ORAL_TABLET | Freq: Every day | ORAL | 0 refills | Status: DC

## 2023-09-30 MED ORDER — METOPROLOL SUCCINATE ER 25 MG PO TB24: 12.5000 mg | ORAL_TABLET | Freq: Every day | ORAL | 3 refills | Status: DC

## 2023-09-30 NOTE — Assessment & Plan Note (Signed)
 Blood pressure is managed with lisinopril  20 mg daily, hydrochlorothiazide  25 mg daily, and metoprolol  XL 25 mg daily, though metoprolol  may cause bradycardia. Elevated BP initially with noted improvement on second reading. We will decrease Metoprolol  to half a tablet (12.5 mg) daily due to low heart rate. Continue lisinopril -hydrochlorothiazide  20-25 mg daily. We will continue to monitor.

## 2023-09-30 NOTE — Assessment & Plan Note (Signed)
 Blood sugar is generally controlled with an A1c of 7.1, though occasional spikes occur with high carbohydrate intake. No hypoglycemia is reported. He is on Tradjenta , metformin , and Farxiga . Continue current medication regimen and check A1c to monitor diabetes control. Encourage healthy diet and exercise.

## 2023-09-30 NOTE — Assessment & Plan Note (Signed)
 He has chronic depression with low motivation and is on Cymbalta  60 mg daily, Seroquel  75 mg at bedtime, and trazodone  200 mg at bedtime. Start Wellbutrin  XL 150 mg daily. PHQ- 7 and GAD- 4 today. Encourage finding enjoyable activities or hobbies and facilitate connection with a therapist. Follow up with Psychiatry as scheduled.

## 2023-09-30 NOTE — Progress Notes (Signed)
 James Burkitt, NP-C Phone: [phone]  James Hood is a 83 y.o. male who presents today for follow up.   Discussed the use of AI scribe software for clinical note transcription with the patient, who gave verbal consent to proceed.  History of Present Illness   James DECASTRO "[place]" is an 83 year old male with depression who presents with worsening depressive symptoms.  He describes his life as lacking purpose and enjoyment, with financial difficulties contributing to his depressive symptoms. He lives on social security and a small pension, leaving him with no disposable income. He spends most of his time sitting in a recliner or on his deck, occasionally attending AA meetings and picking up his grandchildren from school. He feels like a failure and is disappointed in not being able to provide for his wife as he once hoped.  He is currently taking Cymbalta  for depression but feels it is not effective. He is unsure of the dosage but believes it is taken once a day. He also takes Seroquel  and trazodone , which help with sleep but not mood. He wants 'uppers' to increase his energy and motivation, recalling his use during college for studying.  He has chronic pain issues, including a 'bad back' and an 'ankle that is useless,' with pain radiating to his calf. He recently saw pain management and is scheduled to have a stimulator placed behind his knee to manage the ankle pain. He is currently on 10 mg of oxycodone  but feels it is insufficient for his pain, which he experiences constantly.  He has a history of hypertension and diabetes. He takes lisinopril , hydrochlorothiazide , and metoprolol  for blood pressure, though he did not take them on the morning of the visit. He monitors his blood sugar, which averages around 150 mg/dL but can spike to 295 mg/dL after consuming carbohydrates. He takes Tradjenta , metformin , and Farxiga  for diabetes management. His last A1c was 7.1%.  He reports frequent  urination, approximately every half hour, despite taking Flomax  and Trospium . No chest pain, shortness of breath, dizziness, or palpitations, though his wife notes he often exhales forcefully. He does not regularly check his blood pressure or heart rate at home.      Social History   Tobacco Use  Smoking Status Every Day   Current packs/day: 0.50   Average packs/day: 0.5 packs/day for 68.4 years (34.2 ttl pk-yrs)   Types: Cigarettes   Start date: 44   Passive exposure: Current  Smokeless Tobacco Never    Current Outpatient Medications on File Prior to Visit  Medication Sig Dispense Refill   aspirin  EC 81 MG tablet Take 81 mg by mouth daily.     cholestyramine (QUESTRAN) 4 g packet Take 1 packet by mouth 2 (two) times daily.     Continuous Glucose Receiver (FREESTYLE LIBRE 2 READER) DEVI Use to check glucose at least every 8 hours 1 each 0   Continuous Glucose Sensor (FREESTYLE LIBRE 2 SENSOR) MISC Apply every 14 days to check glucose 4 each 3   cyclobenzaprine  (FLEXERIL ) 10 MG tablet Take 1 tablet (10 mg total) by mouth 3 (three) times daily as needed for muscle spasms. 30 tablet 2   dapagliflozin  propanediol (FARXIGA ) 10 MG TABS tablet Take 10 mg by mouth daily.     diphenoxylate -atropine  (LOMOTIL ) 2.5-0.025 MG tablet Take by mouth.     divalproex  (DEPAKOTE  ER) 500 MG 24 hr tablet Take 1 tablet by mouth 2 (two) times daily.     donepezil  (ARICEPT ) 10 MG tablet  Take 10 mg by mouth at bedtime.     DULoxetine  (CYMBALTA ) 30 MG capsule Take 2 capsules (60 mg total) by mouth daily. 60 capsule 3   fenofibrate  (TRICOR ) 145 MG tablet TAKE 1 TABLET BY MOUTH DAILY 90 tablet 1   gabapentin  (NEURONTIN ) 600 MG tablet TAKE TWO TABLETS BY MOUTH THREE TIMES A DAY 180 tablet 1   lidocaine  (LIDODERM ) 5 % Place 1 patch onto the skin every 12 (twelve) hours. Remove & Discard patch within 12 hours or as directed by MD 30 patch 2   lisinopril -hydrochlorothiazide  (ZESTORETIC ) 20-25 MG tablet Take 1 tablet  by mouth 2 (two) times daily. 180 tablet 3   memantine  (NAMENDA ) 10 MG tablet Take 1 tablet by mouth daily.     metFORMIN  (GLUCOPHAGE -XR) 500 MG 24 hr tablet TAKE 1 TABLET BY MOUTH 2 TIMES A DAY 180 tablet 1   Multiple Vitamins-Minerals (CENTRUM SILVER PO) Take 1 tablet by mouth daily.     nystatin  (MYCOSTATIN ) 100000 UNIT/ML suspension Take 5 mLs (500,000 Units total) by mouth 4 (four) times daily. Swish in the mouth and retain for as long as possible (several minutes) before swallowing. Use for 7-14 days. 120 mL 0   omega-3 acid ethyl esters (LOVAZA ) 1 g capsule TAKE 2 CAPSULES BY MOUTH TWICE A DAY 360 capsule 3   omeprazole  (PRILOSEC ) 20 MG capsule Take 20 mg by mouth daily.     [START ON 10/03/2023] oxyCODONE -acetaminophen  (PERCOCET) 10-325 MG tablet Take 1 tablet by mouth every 6 (six) hours as needed for pain. 120 tablet 0   [START ON 11/02/2023] oxyCODONE -acetaminophen  (PERCOCET) 10-325 MG tablet Take 1 tablet by mouth every 6 (six) hours as needed for pain. 120 tablet 0   [START ON 12/02/2023] oxyCODONE -acetaminophen  (PERCOCET) 10-325 MG tablet Take 1 tablet by mouth every 6 (six) hours as needed for pain. 120 tablet 0   pantoprazole  (PROTONIX ) 40 MG tablet Take 40 mg by mouth daily.     pramipexole  (MIRAPEX ) 0.125 MG tablet TAKE 1 TABLET BY MOUTH EVERY NIGHT 2-3 HOURS BEFORE BEDTIME 90 tablet 1   primidone  (MYSOLINE ) 50 MG tablet Take 150-250 mg by mouth 2 (two) times daily. Take 250 mg by mouth in the morning & take 150 mg at night.     QUEtiapine  (SEROQUEL ) 25 MG tablet Take 75 mg by mouth at bedtime.     rosuvastatin  (CRESTOR ) 40 MG tablet Take 1 tablet (40 mg total) by mouth daily. 90 tablet 3   tamsulosin  (FLOMAX ) 0.4 MG CAPS capsule TAKE 2 CAPSULES BY MOUTH DAILY AFTER SUPPER 180 capsule 0   TRADJENTA  5 MG TABS tablet TAKE 1 TABLET BY MOUTH DAILY 30 tablet 5   traZODone  (DESYREL ) 100 MG tablet Take 2 tablets (200 mg total) by mouth at bedtime as needed for sleep. 180 tablet 1   Trospium   Chloride 60 MG CP24 Take 1 capsule (60 mg total) by mouth daily. 30 capsule 11   vitamin C (ASCORBIC ACID) 500 MG tablet Take 1,000 mg by mouth daily.     No current facility-administered medications on file prior to visit.     ROS see history of present illness  Objective  Physical Exam Vitals:   09/30/23 0951 09/30/23 1034  BP: (!) 142/70 120/60  Pulse: (!) 43 (!) 42  Temp: 97.8 F (36.6 C)   SpO2: 98%     BP Readings from Last 3 Encounters:  09/30/23 120/60  09/24/23 (!) 120/52  08/21/23 (!) 140/64   Wt Readings from Last  3 Encounters:  09/30/23 167 lb 3.2 oz (75.8 kg)  09/24/23 165 lb (74.8 kg)  08/21/23 167 lb (75.8 kg)    Physical Exam Constitutional:      General: He is not in acute distress.    Appearance: Normal appearance.  HENT:     Head: Normocephalic.  Cardiovascular:     Rate and Rhythm: Regular rhythm. Bradycardia present.     Heart sounds: Normal heart sounds.  Pulmonary:     Effort: Pulmonary effort is normal.     Breath sounds: Normal breath sounds.  Skin:    General: Skin is warm and dry.  Neurological:     General: No focal deficit present.     Mental Status: He is alert.  Psychiatric:        Mood and Affect: Mood normal.        Behavior: Behavior normal.      Assessment/Plan: Please see individual problem list.  Anxiety and depression Assessment & Plan: He has chronic depression with low motivation and is on Cymbalta  60 mg daily, Seroquel  75 mg at bedtime, and trazodone  200 mg at bedtime. Start Wellbutrin  XL 150 mg daily. PHQ- 7 and GAD- 4 today. Encourage finding enjoyable activities or hobbies and facilitate connection with a therapist. Follow up with Psychiatry as scheduled.   Orders: -     buPROPion  HCl ER (XL); Take 1 tablet (150 mg total) by mouth daily.  Dispense: 90 tablet; Refill: 0  Bradycardia Assessment & Plan: Pulse noted to be 43 and 42 on recheck today. Denies chest pain, shortness of breath, dizziness or  palpitations. We will reduce his Toprol  XL 25 mg to half a tablet (12.5 mg) daily. Return precautions given to patient. We will continue to monitor.  Pulse Readings from Last 7 Encounters:  09/30/23 (!) 42  09/24/23 80  08/21/23 (!) 48  08/20/23 (!) 107  07/08/23 66  07/02/23 72  06/18/23 71     Primary hypertension Assessment & Plan: Blood pressure is managed with lisinopril  20 mg daily, hydrochlorothiazide  25 mg daily, and metoprolol  XL 25 mg daily, though metoprolol  may cause bradycardia. Elevated BP initially with noted improvement on second reading. We will decrease Metoprolol  to half a tablet (12.5 mg) daily due to low heart rate. Continue lisinopril -hydrochlorothiazide  20-25 mg daily. We will continue to monitor.   Orders: -     Comprehensive metabolic panel with GFR -     Metoprolol  Succinate ER; Take 0.5 tablets (12.5 mg total) by mouth daily.  Dispense: 45 tablet; Refill: 3  Type 2 diabetes mellitus with diabetic polyneuropathy, without long-term current use of insulin  (HCC) Assessment & Plan: Blood sugar is generally controlled with an A1c of 7.1, though occasional spikes occur with high carbohydrate intake. No hypoglycemia is reported. He is on Tradjenta , metformin , and Farxiga . Continue current medication regimen and check A1c to monitor diabetes control. Encourage healthy diet and exercise.   Orders: -     Hemoglobin A1c  Chronic pain of left ankle Assessment & Plan: He experiences chronic ankle pain radiating to the calf and back pain. Reports current oxycodone  10 mg regimen is inadequate. A stimulator placement is planned for ankle pain. Continue current management and follow up with Pain Management as scheduled.    Hyperlipidemia, unspecified hyperlipidemia type Assessment & Plan: Cholesterol is managed with Crestor  40 mg daily, Fenofibrate  145 mg daily and Lovaza  2 g twice daily. Continue current medication regimen. Encouraged healthy diet. Check lipid panel today.    Orders: -  Lipid panel    Return in about 3 months (around 12/31/2023).   James Burkitt, NP-C [facility]

## 2023-09-30 NOTE — Assessment & Plan Note (Signed)
 Pulse noted to be 43 and 42 on recheck today. Denies chest pain, shortness of breath, dizziness or palpitations. We will reduce his Toprol  XL 25 mg to half a tablet (12.5 mg) daily. Return precautions given to patient. We will continue to monitor.  Pulse Readings from Last 7 Encounters:  09/30/23 (!) 42  09/24/23 80  08/21/23 (!) 48  08/20/23 (!) 107  07/08/23 66  07/02/23 72  06/18/23 71

## 2023-09-30 NOTE — Assessment & Plan Note (Signed)
 He experiences chronic ankle pain radiating to the calf and back pain. Reports current oxycodone  10 mg regimen is inadequate. A stimulator placement is planned for ankle pain. Continue current management and follow up with Pain Management as scheduled.

## 2023-09-30 NOTE — Assessment & Plan Note (Signed)
 Cholesterol is managed with Crestor  40 mg daily, Fenofibrate  145 mg daily and Lovaza  2 g twice daily. Continue current medication regimen. Encouraged healthy diet. Check lipid panel today.

## 2023-10-16 ENCOUNTER — Encounter: Payer: Self-pay | Admitting: Student in an Organized Health Care Education/Training Program

## 2023-10-16 ENCOUNTER — Ambulatory Visit
Attending: Student in an Organized Health Care Education/Training Program | Admitting: Student in an Organized Health Care Education/Training Program

## 2023-10-16 DIAGNOSIS — M5386 Other specified dorsopathies, lumbar region: Secondary | ICD-10-CM | POA: Diagnosis not present

## 2023-10-16 DIAGNOSIS — M792 Neuralgia and neuritis, unspecified: Secondary | ICD-10-CM | POA: Diagnosis not present

## 2023-10-16 MED ORDER — LACTATED RINGERS IV SOLN: Freq: Once | INTRAVENOUS | Status: AC

## 2023-10-16 MED ORDER — ROPIVACAINE HCL 2 MG/ML IJ SOLN
INTRAMUSCULAR | Status: AC
Start: 2023-10-16 — End: ?
  Filled 2023-10-16: qty 20

## 2023-10-16 MED ORDER — MIDAZOLAM HCL 2 MG/2ML IJ SOLN
INTRAMUSCULAR | Status: AC
Start: 2023-10-16 — End: ?
  Filled 2023-10-16: qty 2

## 2023-10-16 MED ORDER — MIDAZOLAM HCL 2 MG/2ML IJ SOLN
0.5000 mg | Freq: Once | INTRAMUSCULAR | Status: AC
  Administered 2023-10-16: 2 mg via INTRAVENOUS

## 2023-10-16 MED ORDER — LIDOCAINE HCL 2 % IJ SOLN
INTRAMUSCULAR | Status: AC
  Filled 2023-10-16: qty 20

## 2023-10-16 MED ORDER — LIDOCAINE HCL 2 % IJ SOLN
20.0000 mL | Freq: Once | INTRAMUSCULAR | Status: AC
  Administered 2023-10-16: 400 mg

## 2023-10-16 NOTE — Progress Notes (Signed)
 PROVIDER NOTE: Interpretation of information contained herein should be left to medically-trained personnel. Specific patient instructions are provided elsewhere under "Patient Instructions" section of medical record. This document was created in part using STT-dictation technology, any transcriptional errors that may result from this process are unintentional.  Patient: James Hood Type: Established DOB: November 19, 1940 MRN: [id number] PCP: Bluford Burkitt, NP  Service: Procedure DOS: 10/16/2023 Setting: Ambulatory Location: Ambulatory outpatient facility Delivery: Face-to-face Provider: Cephus Collin, MD Specialty: Interventional Pain Management Specialty designation: 09 Location: Outpatient facility Ref. Prov.: Cephus Collin, MD       Interventional Therapy   Primary Reason for Admission: Surgical management of chronic pain condition.  Procedure:  Anesthesia, Analgesia, Anxiolysis:  Type: SPRINTTM Peripheral Nerve Field Stimulator (PNS) MicroLeadTM Implant Purpose: Therapeutic Region: Left popliteal sciatic PNS  Anesthesia: Local (1-2% Lidocaine )   Sedation: Minimal  Guidance: Ultrasound            1. Sciatica of left side associated with disorder of lumbar spine   2. Neuropathic pain of left ankle    NAS-11 Pain score:   Pre-procedure: 4 /10   Post-procedure: 0-No pain/10   H&P (Pre-op Assessment):  James Hood is a 83 y.o. (year old), male patient, seen today for interventional treatment. 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  Initial Vital Signs:  Pulse/EKG Rate: 65ECG Heart Rate: (!) 50 Temp: 98 F (36.7 C) Resp: 16 BP: (!) 118/56 SpO2: 99 %  BMI: Estimated body mass index is 23.68 kg/m as calculated from the following:   Height as of this encounter: 5\' 10"  (1.778 m).   Weight as of this encounter: 165 lb (74.8 kg).  Risk Assessment: Allergies: Reviewed. He has no known allergies.  Allergy Precautions: None required Coagulopathies: Reviewed. None identified.  Blood-thinner therapy: None at this time Active Infection(s): Reviewed. None identified. James Hood is afebrile  Site Confirmation: James Hood was asked to confirm the procedure and laterality before marking the site, which he did. Procedure checklist: Completed Consent: Before the procedure and under the influence of no sedative(s), amnesic(s), or anxiolytics, the patient was informed of the treatment options, risks and possible complications. To fulfill our ethical and legal obligations, as recommended by the American Medical Association's Code of Ethics, I have informed the patient of my clinical impression; the nature and purpose of the treatment or procedure; the risks, benefits, and possible complications of the intervention; the alternatives, including doing nothing; the risk(s) and benefit(s) of the alternative treatment(s) or procedure(s); and the risk(s) and benefit(s) of doing nothing.  James Hood was provided with information about the general risks and possible complications associated with most interventional procedures. These include, but are not limited to: failure to achieve desired goals, infection, bleeding, organ or nerve damage, allergic reactions, paralysis, and/or death.  In addition, he was informed of those risks and possible  complications associated to this particular procedure, which include, but are not limited to: damage to the implant; failure to decrease pain; local, systemic, or serious CNS infections, intraspinal abscess with possible cord compression and paralysis, or life-threatening such as meningitis; intrathecal and/or epidural  bleeding with formation of hematoma with possible spinal cord compression and permanent paralysis; organ damage; nerve injury or damage with subsequent sensory, motor, and/or autonomic system dysfunction, resulting in transient or permanent pain, numbness, and/or weakness of one or several areas of the body; allergic reactions, either minor or major life-threatening, such as anaphylactic or anaphylactoid reactions.  Furthermore, James Hood was informed of those risks and complications associated with the medications. These include, but are not limited to: allergic reactions (i.e.: anaphylactic or anaphylactoid reactions); arrhythmia;  Hypotension/hypertension; cardiovascular collapse; respiratory depression and/or shortness of breath; swelling or edema; medication-induced neural toxicity; particulate matter embolism and blood vessel occlusion with resultant organ, and/or nervous system infarction and permanent paralysis.  Finally, he was informed that Medicine is not an exact science; therefore, there is also the possibility of unforeseen or unpredictable risks and/or possible complications that may result in a catastrophic outcome. The patient indicated having understood very clearly. We have given the patient no guarantees and we have made no promises. Enough time was given to the patient to ask questions, all of which were answered to the patient's satisfaction. James Hood has indicated that he wanted to continue with the procedure. Attestation: I, the ordering provider, attest that I have discussed with the patient the benefits, risks, side-effects, alternatives, likelihood of achieving  goals, and potential problems during recovery for the procedure that I have provided informed consent. Date  Time: 10/16/2023  8:04 AM  Pre-Procedure Preparation:  Monitoring: As per clinic protocol. Respiration, ETCO2, SpO2, BP, heart rate and rhythm monitor placed and checked for adequate function Safety Precautions: Patient was assessed for positional comfort and pressure points before starting the procedure. Time-out: I initiated and conducted the "Time-out" before starting the procedure, as per protocol. The patient was asked to participate by confirming the accuracy of the "Time Out" information. Verification of the correct person, site, and procedure were performed and confirmed by me, the nursing staff, and the patient. "Time-out" conducted as per Joint Commission's Universal Protocol (UP.01.01.01). Time: 0839 Start Time: 0839 hrs.  Description of Procedure Process:   Position: Prone Target Area: <1 cm from targeted nerve (popliteal sciatic nerve block to capture tibial and peroneal nerve) Area Prepped: Entire left posterior popliteal fossa Prepping solution: ChloraPrep (2% chlorhexidine  gluconate and 70% isopropyl alcohol ) Safety Precautions: Safe injection practices and needle disposal techniques used. Medications properly checked for expiration dates.   Description of the Procedure (popliteal sciatic):  Availability of a responsible, adult driver, and NPO status confirmed. Informed consent was obtained after having discussed risks and possible complications. An IV was started. The patient was then taken to the fluoroscopy suite, where the patient was placed in position for the procedure, over the fluoroscopy table. The patient was then monitored in the usual manner.   Patient Positioning: The patient was positioned supine with the left leg elevated and externally rotated at the hip to provide posterior access to the popliteal fossa. A pillow or support was placed under the knee for  patient comfort. Sterile Prep: The left posterior thigh and popliteal fossa were prepped with chlorhexidine  and draped in a sterile manner. Ultrasound Guidance: A high-frequency linear probe (8-15 MHz) was placed in a transverse orientation at the popliteal fossa to identify the sciatic nerve, approximately 5-7 cm proximal to the popliteal crease. The nerve was visualized as a hyperechoic, fascicular structure lateral to the popliteal artery and vein as it bifurcates into the tibial and common peroneal nerve.  Block Technique Local Anesthesia: The skin  and subcutaneous tissue were anesthetized with 2-3 mL of 2% lidocaine  using a 25-gauge needle.   An introducer needle and stimulating probe were  assembled, inserted and advanced along the intended course, introduced in-plane from a lateral-to-medial approach under continuous ultrasound visualization. Needle Placement: The needle was advanced adjacent to the sciatic nerve within the paraneural sheath, avoiding direct intraneural injection.    Multiple stimulation parameters were used to deliver stimulation to the sciatic  nerve in concert with stimulating at multiple positions around the nerve.  Various electrical parameter combinations were tested, and the lead location was adjusted (physically relocated) until the patient indicated paresthesia or muscle tension overlapping the distribution of the patient's typical region of pain.   The stimulating probe was removed from the introducer and a percutaneous lead was guided through the needle and delivered to a location in similar proximity to the nerve. Final  location was verified with electrical stimulation and documented with ultrasound.   The introducer needle was removed, and the exposed end of the percutaneous lead was attached to an external stimulator unit. Various electrical parameter combinations were again  tested until the patient indicated paresthesia or muscle tension  overlapping the  distribution of the patient's typical region of pain.   After confirming that lead impedance was in the normal range, the external unit was detached, the needle was removed, and the lead was anchored at the skin.  The lead was threaded into the connector block and electrical continuity and desired patient response was confirmed. The connector block was attached to the external  stimulator unit.The site was covered with a sterile occlusive dressing and  a ultrasound image was taken to document final placement. The patient was observed for stability of vital signs and comfort.  Vitals:   10/16/23 0850 10/16/23 0855 10/16/23 0858 10/16/23 0908  BP: (!) 101/57 (!) 106/58 104/60 94/66  Pulse:    (!) 59  Resp: 16 17 16 16   Temp:      SpO2: 95% 94% 94% 96%  Weight:      Height:       Start Time: 0839 hrs. End Time: 0855 hrs.    Post-operative Assessment:  Post-procedure Vital Signs:  Pulse/HCG Rate: (!) 59(!) 47 Temp: 98 F (36.7 C) Resp: 16 BP: 94/66 SpO2: 96 %  Complications: No immediate post-treatment complications observed by team, or reported by patient.  Note: The patient tolerated the entire procedure well. A repeat set of vitals were taken after the procedure and the patient was kept under observation following institutional policy, for this type of procedure. Post-procedural neurological assessment was performed, showing return to baseline, prior to discharge. The patient was provided with post-procedure discharge instructions, including a section on how to identify potential problems. Should any problems arise concerning this procedure, the patient was given instructions to immediately contact us , at any time, without hesitation. In any case, we plan to contact the patient by telephone for a follow-up status report regarding this interventional procedure.  Comments:  No additional relevant information.  Plan of Care Orders:  No orders of the defined types were placed in this  encounter.   Chronic Opioid Analgesic:  Oxycodone  10 mg every 6 hours as needed, quantity 120/month; MME equals 60    Medications administered: We administered lidocaine , lactated ringers , and midazolam .  See the medical record for exact dosing, route, and time of administration.  Follow-up plan:   Return in about 8 weeks (around 12/11/2023) for PNS lead pull.  Recent Visits Date Type Provider Dept  09/24/23 Office Visit Patel, Seema K, NP [facility]  08/21/23 Office Visit Cephus Collin, MD [facility]  Showing recent visits within past 90 days and meeting all other requirements Today's Visits Date Type Provider Dept  10/16/23 Procedure visit Cephus Collin, MD [facility]  Showing today's visits and meeting all other requirements Future Appointments Date Type Provider Dept  12/25/23 Appointment Patel, Seema K, NP [facility]  Showing future appointments within next 90 days and meeting all other requirements  Disposition: Discharge home  Discharge (Date  Time): 10/16/2023;   hrs.   Primary Care Physician: Bluford Burkitt, NP Location: [facility] Note by: Cephus Collin, MD (TTS technology used. I apologize for any typographical errors that were not detected and corrected.) Date: 10/16/2023; Time: 9:10 AM

## 2023-10-16 NOTE — Patient Instructions (Signed)

## 2023-10-16 NOTE — Progress Notes (Signed)
 Safety precautions to be maintained throughout the outpatient stay will include: orient to surroundings, keep bed in low position, maintain call bell within reach at all times, provide assistance with transfer out of bed and ambulation.

## 2023-10-16 NOTE — Progress Notes (Signed)
 Virtual Visit via Video Note  I connected with James Hood on 10/21/23 at  4:00 PM EDT by a video enabled telemedicine application and verified that I am speaking with the correct person using two identifiers.  Location: Patient: home Provider: office Persons participated in the visit- patient, provider    I discussed the limitations of evaluation and management by telemedicine and the availability of in person appointments. The patient expressed understanding and agreed to proceed.     I discussed the assessment and treatment plan with the patient. The patient was provided an opportunity to ask questions and all were answered. The patient agreed with the plan and demonstrated an understanding of the instructions.   The patient was advised to call back or seek an in-person evaluation if the symptoms worsen or if the condition fails to improve as anticipated.   James Fossa, MD    [facility] MD/PA/NP OP Progress Note  10/21/2023 5:37 PM James Hood  MRN:  [id number]  Chief Complaint:  Chief Complaint  Patient presents with   Follow-up   HPI:  This is a follow-up appointment for depression and insomnia.  He states that he cannot comprehend personal visit as he put his little dog down today.  He was with him for 15 years.  It has been difficult.  He also states that he has been depressed lately.  He is concerned about finances.  He tends to spend time in a recliner.  He tends to feel guilty that he may not be able to do things for his wife and house.  He adamantly denies any SI, stating that he is a follower of [employer].  However, he is concerned about his depression and is willing to make adjustments in his medication.  He sleeps well.  He denies change in appetite.  Upon reviewing the medication, he has been taking bupropion  150 mg instead of 300 mg.  He agrees with the plans as outlined below.   Visit Diagnosis:    ICD-10-CM   1. MDD (major depressive disorder), recurrent  episode, mild (HCC)  F33.0     2. Insomnia, unspecified type  G47.00       Past Psychiatric History: Please see initial evaluation for full details. I have reviewed the history. No updates at this time.     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   Colon polyps    Complication of anesthesia    Woke during first cataract procedure   Dementia (HCC)    Depression    Diabetes mellitus type 2 in nonobese (HCC)    GERD (gastroesophageal reflux disease)    BARRETTS ESOPHAGUS RESOLVED PER PATIENT   Headache    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HCC)    History of hiatal hernia    Hypercholesteremia    Hypertension    Hyperthyroidism    Neuropathic pain    Primary localized osteoarthritis of right knee    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   Stomach ulcer    Tremor, essential    Wears dentures    full upper   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/02/2018    Past Surgical History:  Procedure Laterality Date   ANKLE ARTHROSCOPY Left 09/29/2019   Procedure: LEFT ANKLE ARTHROSCOPY, DEBRIDEMENT;  Surgeon: Gearldean Keepers  V, MD;  Location:  SURGERY CENTER;  Service: Orthopedics;  Laterality: Left;   BACK SURGERY     BIOPSY  01/18/2023   Procedure: BIOPSY;  Surgeon: Quintin Buckle, DO;  Location: [facility] ENDOSCOPY;  Service: Gastroenterology;;   CATARACT EXTRACTION W/PHACO Right 03/12/2023   Procedure: CATARACT EXTRACTION PHACO AND INTRAOCULAR LENS PLACEMENT (IOC) RIGHT DIABETIC 11.25 01:07.6;  Surgeon: Clair Crews, MD;  Location: [facility] SURGERY CNTR;  Service: Ophthalmology;  Laterality: Right;   CATARACT EXTRACTION W/PHACO Left 03/25/2023   Procedure: CATARACT EXTRACTION PHACO AND INTRAOCULAR LENS PLACEMENT (IOC) LEFT DIABETIC 7.15 00:51.0;  Surgeon: Clair Crews, MD;   Location: [facility]ERY [facility];  Service: Ophthalmology;  Laterality: Left;   COLONOSCOPY WITH PROPOFOL  N/A 09/14/2016   Procedure: COLONOSCOPY WITH PROPOFOL ;  Surgeon: Cassie Click, MD;  Location: [facility] ENDOSCOPY;  Service: Endoscopy;  Laterality: N/A;   esophageal stretch     ESOPHAGOGASTRODUODENOSCOPY (EGD) WITH PROPOFOL  N/A 12/13/2020   Procedure: ESOPHAGOGASTRODUODENOSCOPY (EGD) WITH PROPOFOL ;  Surgeon: Shane Darling, MD;  Location: [facility] ENDOSCOPY;  Service: Endoscopy;  Laterality: N/A;  IDDM   ESOPHAGOGASTRODUODENOSCOPY (EGD) WITH PROPOFOL  N/A 01/18/2023   Procedure: ESOPHAGOGASTRODUODENOSCOPY (EGD) WITH PROPOFOL ;  Surgeon: Quintin Buckle, DO;  Location: [facility] ENDOSCOPY;  Service: Gastroenterology;  Laterality: N/A;   JOINT REPLACEMENT Right 2016   knee   LUMBAR LAMINECTOMY/DECOMPRESSION MICRODISCECTOMY Left 02/03/2016   Procedure: LEFT L5-S1 DISKECTOMY;  Surgeon: Claudetta Cuba, MD;  Location: [facility] NEURO ORS;  Service: Neurosurgery;  Laterality: Left;  LEFT L5-S1 DISKECTOMY   MALONEY DILATION  01/18/2023   Procedure: MALONEY DILATION;  Surgeon: Quintin Buckle, DO;  Location: [facility] ENDOSCOPY;  Service: Gastroenterology;;   PULSE GENERATOR IMPLANT N/A 08/21/2017   Procedure: UNILATERAL PULSE GENERATOR IMPLANT;  Surgeon: Jodeen Munch, MD;  Location: [facility] ORS;  Service: Neurosurgery;  Laterality: N/A;   PULSE GENERATOR IMPLANT Right 04/02/2018   Procedure: REMOVAL OF PULSE GENERATOR IMPLANT AND LEADS;  Surgeon: Jodeen Munch, MD;  Location: [facility] ORS;  Service: Neurosurgery;  Laterality: Right;   TONSILLECTOMY     TOTAL KNEE ARTHROPLASTY Right 11/15/2014   Procedure: TOTAL KNEE ARTHROPLASTY;  Surgeon: Elly Habermann, MD;  Location: [facility] OR;  Service: Orthopedics;  Laterality: Right;    Family Psychiatric History: Please see initial evaluation for full details. I have reviewed the history. No updates at this time.     Family History:  Family History  Problem Relation  Age of Onset   Heart attack Mother    Heart disease Mother    Hypertension Father    Depression Father    Kidney cancer Neg Hx    Prostate cancer Neg Hx     Social History:  Social History   Socioeconomic History   Marital status: Married    Spouse name: Not on file   Number of children: Not on file   Years of education: Not on file   Highest education level: Master's degree (e.g., MA, MS, MEng, MEd, MSW, MBA)  Occupational History   Not on file  Tobacco Use   Smoking status: Every Day    Current packs/day: 0.50    Average packs/day: 0.5 packs/day for 68.4 years (34.2 ttl pk-yrs)    Types: Cigarettes    Start date: 60    Passive exposure: Current   Smokeless tobacco: Never  Vaping Use   Vaping status: Former  Substance and Sexual Activity   Alcohol  use: Not Currently    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   Drug use: Never  Sexual activity: Not Currently  Other Topics Concern   Not on file  Social History Narrative   Not on file   Social Drivers of Health   Financial Resource Strain: Medium Risk (05/03/2023)   Overall Financial Resource Strain (CARDIA)    Difficulty of Paying Living Expenses: Somewhat hard  Food Insecurity: No Food Insecurity (05/03/2023)   Hunger Vital Sign    Worried About Running Out of Food in the Last Year: Never true    Ran Out of Food in the Last Year: Never true  Transportation Needs: No Transportation Needs (05/03/2023)   PRAPARE - Administrator, Civil Service (Medical): No    Lack of Transportation (Non-Medical): No  Physical Activity: Insufficiently Active (05/03/2023)   Exercise Vital Sign    Days of Exercise per Week: 1 day    Minutes of Exercise per Session: 30 min  Stress: No Stress Concern Present (05/03/2023)   [employer] of Occupational Health - Occupational Stress Questionnaire    Feeling of Stress : Not at all  Social Connections: Socially Integrated (05/03/2023)   Social Connection and  Isolation Panel [NHANES]    Frequency of Communication with Friends and Family: More than three times a week    Frequency of Social Gatherings with Friends and Family: Three times a week    Attends Religious Services: More than 4 times per year    Active Member of Clubs or Organizations: No    Attends Engineer, structural: More than 4 times per year    Marital Status: Married    Allergies: No Known Allergies  Metabolic Disorder Labs: Lab Results  Component Value Date   HGBA1C 7.3 (H) 09/30/2023   MPG 131 01/25/2016   MPG 166 11/05/2014   No results found for: "PROLACTIN" Lab Results  Component Value Date   CHOL 118 09/30/2023   TRIG 217.0 (H) 09/30/2023   HDL 46.80 09/30/2023   CHOLHDL 3 09/30/2023   VLDL 43.4 (H) 09/30/2023   LDLCALC 28 09/30/2023   LDLCALC 37 08/27/2022   Lab Results  Component Value Date   TSH 0.59 01/16/2023   TSH 1.93 12/04/2022    Therapeutic Level Labs: No results found for: "LITHIUM" No results found for: "VALPROATE" No results found for: "CBMZ"  Current Medications: Current Outpatient Medications  Medication Sig Dispense Refill   aspirin  EC 81 MG tablet Take 81 mg by mouth daily.     buPROPion  (WELLBUTRIN  XL) 150 MG 24 hr tablet Take 1 tablet (150 mg total) by mouth daily. 90 tablet 0   buPROPion  (WELLBUTRIN  XL) 300 MG 24 hr tablet Take 1 tablet (300 mg total) by mouth daily. (Patient not taking: Reported on 10/21/2023) 90 tablet 0   cholestyramine (QUESTRAN) 4 g packet Take 1 packet by mouth 2 (two) times daily.     Continuous Glucose Receiver (FREESTYLE LIBRE 2 READER) DEVI Use to check glucose at least every 8 hours 1 each 0   Continuous Glucose Sensor (FREESTYLE LIBRE 2 SENSOR) MISC Apply every 14 days to check glucose 4 each 3   cyclobenzaprine  (FLEXERIL ) 10 MG tablet Take 1 tablet (10 mg total) by mouth 3 (three) times daily as needed for muscle spasms. 30 tablet 2   dapagliflozin  propanediol (FARXIGA ) 10 MG TABS tablet Take 10  mg by mouth daily.     diphenoxylate -atropine  (LOMOTIL ) 2.5-0.025 MG tablet Take by mouth.     divalproex  (DEPAKOTE  ER) 500 MG 24 hr tablet Take 1 tablet by mouth 2 (two)  times daily.     donepezil  (ARICEPT ) 10 MG tablet Take 10 mg by mouth at bedtime.     DULoxetine  (CYMBALTA ) 30 MG capsule Take 2 capsules (60 mg total) by mouth daily. 60 capsule 3   fenofibrate  (TRICOR ) 145 MG tablet TAKE 1 TABLET BY MOUTH DAILY 90 tablet 1   gabapentin  (NEURONTIN ) 600 MG tablet TAKE TWO TABLETS BY MOUTH THREE TIMES A DAY 180 tablet 1   lidocaine  (LIDODERM ) 5 % Place 1 patch onto the skin every 12 (twelve) hours. Remove & Discard patch within 12 hours or as directed by MD 30 patch 2   lisinopril -hydrochlorothiazide  (ZESTORETIC ) 20-25 MG tablet Take 1 tablet by mouth 2 (two) times daily. 180 tablet 3   memantine  (NAMENDA ) 10 MG tablet Take 1 tablet by mouth daily.     metFORMIN  (GLUCOPHAGE -XR) 500 MG 24 hr tablet TAKE 1 TABLET BY MOUTH 2 TIMES A DAY 180 tablet 1   metoprolol  succinate (TOPROL -XL) 25 MG 24 hr tablet Take 0.5 tablets (12.5 mg total) by mouth daily. 45 tablet 3   Multiple Vitamins-Minerals (CENTRUM SILVER PO) Take 1 tablet by mouth daily.     nystatin  (MYCOSTATIN ) 100000 UNIT/ML suspension Take 5 mLs (500,000 Units total) by mouth 4 (four) times daily. Swish in the mouth and retain for as long as possible (several minutes) before swallowing. Use for 7-14 days. 120 mL 0   omega-3 acid ethyl esters (LOVAZA ) 1 g capsule TAKE 2 CAPSULES BY MOUTH TWICE A DAY 360 capsule 3   omeprazole  (PRILOSEC ) 20 MG capsule Take 20 mg by mouth daily.     oxyCODONE -acetaminophen  (PERCOCET) 10-325 MG tablet Take 1 tablet by mouth every 6 (six) hours as needed for pain. 120 tablet 0   [START ON 11/02/2023] oxyCODONE -acetaminophen  (PERCOCET) 10-325 MG tablet Take 1 tablet by mouth every 6 (six) hours as needed for pain. 120 tablet 0   [START ON 12/02/2023] oxyCODONE -acetaminophen  (PERCOCET) 10-325 MG tablet Take 1 tablet by  mouth every 6 (six) hours as needed for pain. 120 tablet 0   pantoprazole  (PROTONIX ) 40 MG tablet Take 40 mg by mouth daily.     pramipexole  (MIRAPEX ) 0.125 MG tablet TAKE 1 TABLET BY MOUTH EVERY NIGHT 2-3 HOURS BEFORE BEDTIME 90 tablet 1   primidone  (MYSOLINE ) 50 MG tablet Take 150-250 mg by mouth 2 (two) times daily. Take 250 mg by mouth in the morning & take 150 mg at night.     QUEtiapine  (SEROQUEL ) 25 MG tablet Take 75 mg by mouth at bedtime.     rosuvastatin  (CRESTOR ) 40 MG tablet Take 1 tablet (40 mg total) by mouth daily. 90 tablet 3   tamsulosin  (FLOMAX ) 0.4 MG CAPS capsule TAKE 2 CAPSULES BY MOUTH DAILY AFTER SUPPER 180 capsule 0   TRADJENTA  5 MG TABS tablet TAKE 1 TABLET BY MOUTH DAILY 30 tablet 5   traZODone  (DESYREL ) 100 MG tablet Take 2 tablets (200 mg total) by mouth at bedtime as needed for sleep. 180 tablet 1   Trospium  Chloride 60 MG CP24 Take 1 capsule (60 mg total) by mouth daily. 30 capsule 11   vitamin C (ASCORBIC ACID) 500 MG tablet Take 1,000 mg by mouth daily.     No current facility-administered medications for this visit.     Musculoskeletal: Strength & Muscle Tone: within normal limits Gait & Station: normal Patient leans: N/A  Psychiatric Specialty Exam: Review of Systems  Psychiatric/Behavioral:  Positive for dysphoric mood. Negative for agitation, behavioral problems, confusion, decreased concentration, hallucinations, self-injury,  sleep disturbance and suicidal ideas. The patient is not nervous/anxious and is not hyperactive.   All other systems reviewed and are negative.   There were no vitals taken for this visit.There is no height or weight on file to calculate BMI.  General Appearance: Well Groomed  Eye Contact:  Good  Speech:  Clear and Coherent  Volume:  Normal  Mood:  Depressed  Affect:  Appropriate, Congruent, and down  Thought Process:  Coherent  Orientation:  Full (Time, Place, and Person)  Thought Content: Logical   Suicidal Thoughts:  No   Homicidal Thoughts:  No  Memory:  Immediate;   Good  Judgement:  Good  Insight:  Good  Psychomotor Activity:  Normal  Concentration:  Concentration: Good and Attention Span: Good  Recall:  Good  Fund of Knowledge: Good  Language: Good  Akathisia:  No  Handed:  Right  AIMS (if indicated): not done  Assets:  Communication Skills Desire for Improvement  ADL's:  Intact  Cognition: WNL  Sleep:  Good   Screenings: GAD-7    Flowsheet Row Office Visit from 09/30/2023 in [facility] [employer] from 08/26/2023 in [facility] Office Visit from 05/06/2023 in [facility] [employer] Visit from 03/19/2023 in [facility] [employer] Visit from 02[facility]27/2023 in [facility] [employer] [facility]  Total GAD-7 Score 4 4 11  0 2      Mini-Mental    Flowsheet Row Clinical Support from 11/15/2017 in [facility] [employer] Clinical Support from 11/14/2016 in [facility] [employer]  Total Score (max 30 points ) 30 30      PHQ2-9    Flowsheet Row Office Visit from 09/30/2023 in [facility] [employer] from 08/26/2023 in [facility] Office Visit from 08/21/2023 in [facility] Procedure visit from 07/08/2023 in [facility] Visit from 06/18/2023 in [facility] Total Score 2 1 0 1 0  PHQ-9 Total Score 7 4 -- -- --      Flowsheet Row Counselor from 08/26/2023 in [facility] Admission (Discharged) from 03/25/2023 in [facility] Admission  (Discharged) from 03/12/2023 in [facility]  C-SSRS RISK CATEGORY No Risk No Risk No Risk        Assessment and Plan:  James Hood is a 83 y.o. year old male with a history of  pseudo dementia, sleep disorder, tremor, history of QTc prolongation , who presents for follow up appointment for below.   1. MDD (major depressive disorder), recurrent episode, mild (HCC) Acute stressors include: ankle pain, moving into a camper due to financial strain  Other stressors include: conflict with his step son, financial strain    History He reports worsening in depressive symptoms in the context of financial strain, loss of his dog.  It is also noted that he has been on lower dose of bupropion  for some time without known reason.  Will titrate bupropion  to optimize treatment for depression.  He has no known history of seizure.  Will continue duloxetine  to target depression. Noted that he is on quetiapine , gabapentin , and Depakote , which have been prescribed by another provider.  May consider tapering off either of the medication to avoid polypharmacy after his mood is stabilized.   2. Insomnia, unspecified type Stable, and he reports good benefit from trazodone .  Will continue current dose to target insomnia. He is aware of his potential risk of drowsiness and fall especially with concomitant use of quetiapine , gabapentin  and opioid.     # Alcohol  use disorder in sustained remission  History:  He began drinking in college but has been abstinent since the age of 89. He actively participates in [employer] Stable. Although he reports occasional craving, he has been abstinent from alcohol .  Will continue motivational interview.    # History of cognitive impairment Followed-up by neurologist, and has been on memantine  and donepezil . 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 Acute risk factors for suicide include: N/A. Protective factors for this patient include: positive social support, responsibility to others (children, family), coping skills and hope for the future. Considering these factors, the overall suicide risk at this point appears to be low. Patient is appropriate for outpatient follow up.    Collaboration of Care: Collaboration of Care: Other reviewed notes in Epic  Patient/Guardian was advised Release of Information must be obtained prior to any record release in order to collaborate their care with an outside provider. Patient/Guardian was advised if they have not already done so to contact the registration department to sign all necessary forms in order for us  to release information regarding their care.   Consent: Patient/Guardian gives verbal consent for treatment and assignment of benefits for services provided during this visit. Patient/Guardian expressed understanding and agreed to proceed.    James Fossa, MD 10/21/2023, 5:37 PM

## 2023-10-17 ENCOUNTER — Telehealth: Payer: Self-pay

## 2023-10-17 NOTE — Telephone Encounter (Signed)
 Post procedure follow up.  Patient states he is doing fine

## 2023-10-20 HISTORY — PX: KNEE SURGERY: SHX244

## 2023-10-21 ENCOUNTER — Encounter: Payer: Self-pay | Admitting: Psychiatry

## 2023-10-21 ENCOUNTER — Telehealth (INDEPENDENT_AMBULATORY_CARE_PROVIDER_SITE_OTHER): Admitting: Psychiatry

## 2023-10-21 DIAGNOSIS — G4700 Insomnia, unspecified: Secondary | ICD-10-CM | POA: Diagnosis not present

## 2023-10-21 DIAGNOSIS — F330 Major depressive disorder, recurrent, mild: Secondary | ICD-10-CM

## 2023-10-21 MED ORDER — BUPROPION HCL ER (XL) 300 MG PO TB24: 300.0000 mg | ORAL_TABLET | Freq: Every day | ORAL | 0 refills | Status: DC

## 2023-10-21 NOTE — Patient Instructions (Signed)
 Increase bupropion  XR 300 mg daily Continue duloxetine  60 mg daily  Continue Trazodone  200 mg at night as needed for insomnia  Next appointment: 7/15 at 3 pm

## 2023-11-06 ENCOUNTER — Emergency Department

## 2023-11-06 ENCOUNTER — Telehealth: Payer: Self-pay

## 2023-11-06 ENCOUNTER — Emergency Department
Admission: EM | Admit: 2023-11-06 | Discharge: 2023-11-06 | Disposition: A | Discharge status: Home / Self Care | Attending: Emergency Medicine | Admitting: Emergency Medicine

## 2023-11-06 ENCOUNTER — Other Ambulatory Visit: Payer: Self-pay

## 2023-11-06 DIAGNOSIS — W010XXA Fall on same level from slipping, tripping and stumbling without subsequent striking against object, initial encounter: Secondary | ICD-10-CM | POA: Insufficient documentation

## 2023-11-06 DIAGNOSIS — G8929 Other chronic pain: Secondary | ICD-10-CM | POA: Diagnosis not present

## 2023-11-06 DIAGNOSIS — Z23 Encounter for immunization: Secondary | ICD-10-CM | POA: Insufficient documentation

## 2023-11-06 DIAGNOSIS — S01511A Laceration without foreign body of lip, initial encounter: Secondary | ICD-10-CM | POA: Insufficient documentation

## 2023-11-06 DIAGNOSIS — Z7982 Long term (current) use of aspirin: Secondary | ICD-10-CM | POA: Diagnosis not present

## 2023-11-06 DIAGNOSIS — S0990XA Unspecified injury of head, initial encounter: Secondary | ICD-10-CM | POA: Diagnosis not present

## 2023-11-06 DIAGNOSIS — E119 Type 2 diabetes mellitus without complications: Secondary | ICD-10-CM | POA: Diagnosis not present

## 2023-11-06 DIAGNOSIS — W19XXXA Unspecified fall, initial encounter: Secondary | ICD-10-CM

## 2023-11-06 DIAGNOSIS — S0993XA Unspecified injury of face, initial encounter: Secondary | ICD-10-CM | POA: Diagnosis present

## 2023-11-06 MED ORDER — LIDOCAINE HCL (PF) 1 % IJ SOLN
5.0000 mL | Freq: Once | INTRAMUSCULAR | Status: AC
  Administered 2023-11-06: 5 mL
  Filled 2023-11-06: qty 5

## 2023-11-06 MED ORDER — AMOXICILLIN-POT CLAVULANATE 875-125 MG PO TABS: 1.0000 | ORAL_TABLET | Freq: Two times a day (BID) | ORAL | 0 refills | Status: AC

## 2023-11-06 MED ORDER — TETANUS-DIPHTH-ACELL PERTUSSIS 5-2.5-18.5 LF-MCG/0.5 IM SUSY
0.5000 mL | PREFILLED_SYRINGE | Freq: Once | INTRAMUSCULAR | Status: AC
  Administered 2023-11-06: 0.5 mL via INTRAMUSCULAR
  Filled 2023-11-06: qty 0.5

## 2023-11-06 MED ORDER — ACETAMINOPHEN 500 MG PO TABS
1000.0000 mg | ORAL_TABLET | Freq: Once | ORAL | Status: AC
  Administered 2023-11-06: 1000 mg via ORAL
  Filled 2023-11-06: qty 2

## 2023-11-06 MED ORDER — OXYCODONE HCL 5 MG PO TABS
10.0000 mg | ORAL_TABLET | Freq: Once | ORAL | Status: AC
  Administered 2023-11-06: 10 mg via ORAL
  Filled 2023-11-06: qty 2

## 2023-11-06 NOTE — ED Triage Notes (Signed)
 Patient states he tripped and fell face first onto concrete; bleeding in mouth, no LOC.

## 2023-11-06 NOTE — ED Provider Triage Note (Addendum)
 Emergency Medicine Provider Triage Evaluation Note  James Hood , a 83 y.o. male  was evaluated in triage.  Pt complains of tripping and falling on his face with posterior mouth bleeding.  Patient states he has denture.  He is taking aspirin .  Patient denies loss of consciousness, denies trauma in other parts of his body.   Review of Systems  Positive:  Negative:   Physical Exam  BP (!) 133/94   Pulse 79   Temp (!) 97.2 F (36.2 C) (Axillary)   Resp 18   Ht 5' 10 (1.778 m)   Wt 74.8 kg   SpO2 96%   BMI 23.68 kg/m during triage patient is hypertensive Gen:   Awake, no distress, no difficulty breathing, no stridor Resp:  Normal effort  MSK:   Moves extremities without difficulty Other:  Mouth: Edema in the left buccal mucosa, active bleeding, no teeth.   Medical Decision Making  Medically screening exam initiated at 3:52 PM.  Appropriate orders placed.  [name] was informed that the remainder of the evaluation will be completed by another provider, this initial triage assessment does not replace that evaluation, and the importance of remaining in the ED until their evaluation is complete.  Patient who presents with facial trauma, and active valve bleeding.  Denies taking blood thinners but he is taking aspirin    Awilda Lennox, PA-C 11/06/23 1555    Awilda Lennox, PA-C 11/06/23 1556

## 2023-11-06 NOTE — ED Provider Notes (Addendum)
 [facility] Provider Note    Event Date/Time   First MD Initiated Contact with Patient 11/06/23 1719     (approximate)   History   Fall   HPI  James Hood is a 83 y.o. male with history of diabetes, chronic pain on oxycodone  10 mg who comes in for a fall.  Patient reports mechanical fall and having his dentures in and developing an abrasion to the top of his lip on the inner side.  He is on aspirin .  He denies any other injuries.  States that it was mechanical fall which he tripped.  Denies any chest pain, shortness of breath prior to the fall.   Physical Exam   Triage Vital Signs: ED Triage Vitals [11/06/23 1551]  Encounter Vitals Group     BP (!) 133/94     Girls Systolic BP Percentile      Girls Diastolic BP Percentile      Boys Systolic BP Percentile      Boys Diastolic BP Percentile      Pulse Rate 79     Resp 18     Temp (!) 97.2 F (36.2 C)     Temp Source Axillary     SpO2 96 %     Weight 165 lb (74.8 kg)     Height 5' 10 (1.778 m)     Head Circumference      Peak Flow      Pain Score 4     Pain Loc      Pain Education      Exclude from Growth Chart     Most recent vital signs: Vitals:   11/06/23 1551  BP: (!) 133/94  Pulse: 79  Resp: 18  Temp: (!) 97.2 F (36.2 C)  SpO2: 96%     General: Awake, no distress.  CV:  Good peripheral perfusion.  Resp:  Normal effort.  Abd:  No distention.  Other:  No chest wall tenderness no abdominal tenderness able to lift up both legs.  Patient has laceration on the upper inner lip that is actively venously bleeding.  No involvement of the vermilion border is all intraoral   ED Results / Procedures / Treatments   Labs (all labs ordered are listed, but only abnormal results are displayed) Labs Reviewed - No data to display   EKG  My interpretation of EKG:    RADIOLOGY I have reviewed the CT personally and interpreted no ICH    PROCEDURES:  Critical Care performed:  No  .Laceration Repair  Date/Time: 11/06/2023 6:38 PM  Performed by: Lubertha Rush, MD Authorized by: Lubertha Rush, MD   Consent:    Consent obtained:  Verbal   Consent given by:  Patient   Risks discussed:  Infection, pain, retained foreign body, need for additional repair, nerve damage, poor wound healing, poor cosmetic result, tendon damage and vascular damage   Alternatives discussed:  No treatment Universal protocol:    Patient identity confirmed:  Verbally with patient Laceration details:    Location:  Lip   Lip location:  Upper interior lip   Length (cm):  3 Exploration:    Limited defect created (wound extended): no     Contaminated: no   Treatment:    Area cleansed with:  Saline and povidone-iodine    Amount of cleaning:  Standard   Irrigation volume:  500cc   Irrigation method:  Syringe   Visualized foreign bodies/material removed: no   Skin repair:  Repair method:  Sutures   Suture size:  5-0   Suture material:  Fast-absorbing gut   Suture technique:  Simple interrupted   Number of sutures:  5 Approximation:    Approximation:  Close   Vermilion border well-aligned: did not involve the vermilion border.   Repair type:    Repair type:  Simple Post-procedure details:    Dressing:  Open (no dressing)   Procedure completion:  Tolerated well, no immediate complications    MEDICATIONS ORDERED IN ED: Medications  Tdap (BOOSTRIX) injection 0.5 mL (0.5 mLs Intramuscular Given 11/06/23 1745)  acetaminophen  (TYLENOL ) tablet 1,000 mg (1,000 mg Oral Given 11/06/23 1743)  lidocaine  (PF) (XYLOCAINE ) 1 % injection 5 mL (5 mLs Other Given 11/06/23 1751)  oxyCODONE  (Oxy IR/ROXICODONE ) immediate release tablet 10 mg (10 mg Oral Given 11/06/23 1743)     IMPRESSION / MDM / ASSESSMENT AND PLAN / ED COURSE  I reviewed the triage vital signs and the nursing notes.   Patient's presentation is most consistent with acute presentation with potential threat to life or bodily  function.   Differential cranial hemorrhage, facial fracture, cervical fracture, intracranial hemorrhage  Patient had lip laceration noted to the inner lip.  This had some venous bleeding noted to it that after repair did stop bleeding.  Laceration did not involve the mobility and border so was able to repair it with absorbable sutures.  CT imaging ordered and reassuring.  Patient given his home pain medications, Tdap.  Will start patient on Augmentin  to prevent infection and have him use over-the-counter mouthwash to help prevent infection.  He expressed understanding and felt comfortable with plan.  We discussed that these lacerations have high risk for infections and to return if symptoms are worsening or changing  Consider blood work for patient but he denies any other symptoms.  His CMP was reviewed from 5/22 which shows normal creatinine and his CBC was reviewed from 2024 with baseline hemoglobin between 10 and 11.  We discussed blood work and they have opted to hold off given he is asymptomatic at this time  Incidental finding of thyroid  nodules were discussed with patient and need for follow-up outpatient he expressed understanding   FINAL CLINICAL IMPRESSION(S) / ED DIAGNOSES   Final diagnoses:  Lip laceration, initial encounter  Fall, initial encounter     Rx / DC Orders   ED Discharge Orders          Ordered    amoxicillin -clavulanate (AUGMENTIN ) 875-125 MG tablet  2 times daily        11/06/23 1757             Note:  This document was prepared using Dragon voice recognition software and may include unintentional dictation errors.   Lubertha Rush, MD 11/06/23 1843    Lubertha Rush, MD 11/06/23 [id number]

## 2023-11-06 NOTE — Telephone Encounter (Signed)
 Copied from CRM [id number]. Topic: Appointments - Transfer of Care >> Nov 05, 2023  4:58 PM DeAngela L wrote: Pt is requesting to transfer FROM: Dr. Bluford Burkitt  Pt is requesting to transfer TO: Dr. Jacklin Mascot  Reason for requested transfer: would like an md for a provider  It is the responsibility of the team the patient would like to transfer to (Dr. Dr. Jacklin Mascot) to reach out to the patient if for any reason this transfer is not acceptable.  I spoke with patient's wife, Christin Pelc, who is on patient's DPR, and she states she had called regarding patient transferring care to Dr. Casimir Cleaver.  Christin states just leave him with Bluford Burkitt, NP, for now.  Christin states she will talk with him and have him call us  back if he decides to transfer his care to another provider.

## 2023-11-06 NOTE — Discharge Instructions (Addendum)
 Return to the ER if you develop fevers, worsening symptoms or any other concerns otherwise do a soft diet for a week take antibiotics to prevent infection and return to the ER if develop fevers, redness of the skin or any other concerns    IMPRESSION: No CT evidence of acute intracranial abnormality.   No acute maxillofacial fracture.   No acute fracture or traumatic malalignment of the cervical spine.   Mild soft tissue swelling in the subcutaneous tissues of the lower left cheek.  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 Recommend correlation with nonemergent thyroid  ultrasound if not previously performed.     Chronic and degenerative changes as above.

## 2023-11-06 NOTE — ED Notes (Signed)
 Pt verbalizes understanding of discharge instructions. Opportunity for questioning and answers were provided. Pt discharged from ED to home with family.

## 2023-11-07 ENCOUNTER — Ambulatory Visit: Admitting: Clinical

## 2023-11-07 NOTE — Telephone Encounter (Unsigned)
 Copied from CRM [id number]. Topic: Appointments - Appointment Scheduling >> Nov 07, 2023  4:45 PM Tiffini S wrote: Patient/patient spouse is calling to schedule an hospital follow up appointment with Bluford Burkitt. Scheduled the appointment on 12/24/23 and wait list the appointment. Spouse is asking for a call back at [phone] for a sooner appointment. Please advise.

## 2023-11-08 NOTE — Telephone Encounter (Signed)
 This appointment would be a ED follow up not a hospital follow up. Lorice Roof only has 11/14/2023 @ 4 pm. Could the patient see another available provider next week?

## 2023-11-11 NOTE — Telephone Encounter (Signed)
 No open appointments with PCP before the 14 day cut off to be seen.

## 2023-11-11 NOTE — Telephone Encounter (Signed)
 Noted

## 2023-11-13 ENCOUNTER — Ambulatory Visit

## 2023-11-27 ENCOUNTER — Other Ambulatory Visit: Payer: Self-pay

## 2023-11-27 MED ORDER — LINAGLIPTIN 5 MG PO TABS: 5.0000 mg | ORAL_TABLET | Freq: Every day | ORAL | 3 refills | Status: DC

## 2023-11-29 ENCOUNTER — Inpatient Hospital Stay

## 2023-11-29 ENCOUNTER — Ambulatory Visit: Admitting: Nurse Practitioner

## 2023-11-29 ENCOUNTER — Inpatient Hospital Stay
Admission: EM | Admit: 2023-11-29 | Discharge: 2023-11-30 | DRG: 641 | Disposition: A | Discharge status: Home / Self Care | Attending: Internal Medicine | Admitting: Internal Medicine

## 2023-11-29 ENCOUNTER — Other Ambulatory Visit: Payer: Self-pay

## 2023-11-29 ENCOUNTER — Emergency Department

## 2023-11-29 DIAGNOSIS — R634 Abnormal weight loss: Secondary | ICD-10-CM | POA: Diagnosis present

## 2023-11-29 DIAGNOSIS — F32A Depression, unspecified: Secondary | ICD-10-CM | POA: Insufficient documentation

## 2023-11-29 DIAGNOSIS — F17210 Nicotine dependence, cigarettes, uncomplicated: Secondary | ICD-10-CM | POA: Diagnosis not present

## 2023-11-29 DIAGNOSIS — I10 Essential (primary) hypertension: Secondary | ICD-10-CM | POA: Diagnosis not present

## 2023-11-29 DIAGNOSIS — Z860100 Personal history of colon polyps, unspecified: Secondary | ICD-10-CM | POA: Diagnosis not present

## 2023-11-29 DIAGNOSIS — G250 Essential tremor: Secondary | ICD-10-CM | POA: Diagnosis present

## 2023-11-29 DIAGNOSIS — M179 Osteoarthritis of knee, unspecified: Secondary | ICD-10-CM | POA: Diagnosis present

## 2023-11-29 DIAGNOSIS — Z720 Tobacco use: Secondary | ICD-10-CM | POA: Diagnosis present

## 2023-11-29 DIAGNOSIS — Z7984 Long term (current) use of oral hypoglycemic drugs: Secondary | ICD-10-CM | POA: Insufficient documentation

## 2023-11-29 DIAGNOSIS — Z7982 Long term (current) use of aspirin: Secondary | ICD-10-CM | POA: Diagnosis not present

## 2023-11-29 DIAGNOSIS — E7800 Pure hypercholesterolemia, unspecified: Secondary | ICD-10-CM | POA: Diagnosis not present

## 2023-11-29 DIAGNOSIS — N183 Chronic kidney disease, stage 3 unspecified: Secondary | ICD-10-CM

## 2023-11-29 DIAGNOSIS — F0394 Unspecified dementia, unspecified severity, with anxiety: Secondary | ICD-10-CM | POA: Diagnosis present

## 2023-11-29 DIAGNOSIS — Z79899 Other long term (current) drug therapy: Secondary | ICD-10-CM | POA: Insufficient documentation

## 2023-11-29 DIAGNOSIS — G2581 Restless legs syndrome: Secondary | ICD-10-CM | POA: Diagnosis present

## 2023-11-29 DIAGNOSIS — K5710 Diverticulosis of small intestine without perforation or abscess without bleeding: Secondary | ICD-10-CM | POA: Diagnosis present

## 2023-11-29 DIAGNOSIS — Z8711 Personal history of peptic ulcer disease: Secondary | ICD-10-CM

## 2023-11-29 DIAGNOSIS — E860 Dehydration: Secondary | ICD-10-CM | POA: Diagnosis not present

## 2023-11-29 DIAGNOSIS — F0393 Unspecified dementia, unspecified severity, with mood disturbance: Secondary | ICD-10-CM | POA: Diagnosis not present

## 2023-11-29 DIAGNOSIS — Z818 Family history of other mental and behavioral disorders: Secondary | ICD-10-CM

## 2023-11-29 DIAGNOSIS — F331 Major depressive disorder, recurrent, moderate: Secondary | ICD-10-CM | POA: Diagnosis present

## 2023-11-29 DIAGNOSIS — Z8249 Family history of ischemic heart disease and other diseases of the circulatory system: Secondary | ICD-10-CM

## 2023-11-29 DIAGNOSIS — R519 Headache, unspecified: Secondary | ICD-10-CM | POA: Diagnosis present

## 2023-11-29 DIAGNOSIS — E1140 Type 2 diabetes mellitus with diabetic neuropathy, unspecified: Secondary | ICD-10-CM | POA: Diagnosis not present

## 2023-11-29 DIAGNOSIS — E785 Hyperlipidemia, unspecified: Secondary | ICD-10-CM | POA: Diagnosis not present

## 2023-11-29 DIAGNOSIS — Z66 Do not resuscitate: Secondary | ICD-10-CM | POA: Diagnosis not present

## 2023-11-29 DIAGNOSIS — M1710 Unilateral primary osteoarthritis, unspecified knee: Secondary | ICD-10-CM | POA: Insufficient documentation

## 2023-11-29 DIAGNOSIS — F419 Anxiety disorder, unspecified: Secondary | ICD-10-CM | POA: Diagnosis present

## 2023-11-29 DIAGNOSIS — Z9189 Other specified personal risk factors, not elsewhere classified: Secondary | ICD-10-CM | POA: Diagnosis not present

## 2023-11-29 DIAGNOSIS — R110 Nausea: Secondary | ICD-10-CM | POA: Diagnosis present

## 2023-11-29 DIAGNOSIS — R112 Nausea with vomiting, unspecified: Secondary | ICD-10-CM | POA: Diagnosis not present

## 2023-11-29 DIAGNOSIS — G629 Polyneuropathy, unspecified: Secondary | ICD-10-CM

## 2023-11-29 DIAGNOSIS — R000 Tachycardia, unspecified: Secondary | ICD-10-CM

## 2023-11-29 DIAGNOSIS — N400 Enlarged prostate without lower urinary tract symptoms: Secondary | ICD-10-CM | POA: Diagnosis not present

## 2023-11-29 DIAGNOSIS — Z96651 Presence of right artificial knee joint: Secondary | ICD-10-CM | POA: Diagnosis present

## 2023-11-29 DIAGNOSIS — E119 Type 2 diabetes mellitus without complications: Secondary | ICD-10-CM

## 2023-11-29 LAB — COMPREHENSIVE METABOLIC PANEL WITH GFR
ALT: 30 U/L (ref 0–44)
AST: 47 U/L — ABNORMAL HIGH (ref 15–41)
Albumin: 3.9 g/dL (ref 3.5–5.0)
Alkaline Phosphatase: 33 U/L — ABNORMAL LOW (ref 38–126)
Anion gap: 17 — ABNORMAL HIGH (ref 5–15)
BUN: 49 mg/dL — ABNORMAL HIGH (ref 8–23)
CO2: 26 mmol/L (ref 22–32)
Calcium: 10 mg/dL (ref 8.9–10.3)
Chloride: 94 mmol/L — ABNORMAL LOW (ref 98–111)
Creatinine, Ser: 1.44 mg/dL — ABNORMAL HIGH (ref 0.61–1.24)
GFR, Estimated: 48 mL/min — ABNORMAL LOW (ref >60)
Glucose, Bld: 161 mg/dL — ABNORMAL HIGH (ref 70–99)
Potassium: 4.5 mmol/L (ref 3.5–5.1)
Sodium: 137 mmol/L (ref 135–145)
Total Bilirubin: 1.3 mg/dL — ABNORMAL HIGH (ref 0.0–1.2)
Total Protein: 7 g/dL (ref 6.5–8.1)

## 2023-11-29 LAB — CBG MONITORING, ED: Glucose-Capillary: 105 mg/dL — ABNORMAL HIGH (ref 70–99)

## 2023-11-29 LAB — CBC
HCT: 36.7 % — ABNORMAL LOW (ref 39.0–52.0)
Hemoglobin: 12.2 g/dL — ABNORMAL LOW (ref 13.0–17.0)
MCH: 31.1 pg (ref 26.0–34.0)
MCHC: 33.2 g/dL (ref 30.0–36.0)
MCV: 93.6 fL (ref 80.0–100.0)
Platelets: 287 K/uL (ref 150–400)
RBC: 3.92 MIL/uL — ABNORMAL LOW (ref 4.22–5.81)
RDW: 13.2 % (ref 11.5–15.5)
WBC: 8.4 K/uL (ref 4.0–10.5)
nRBC: 0 % (ref 0.0–0.2)

## 2023-11-29 LAB — URINALYSIS, ROUTINE W REFLEX MICROSCOPIC
Bilirubin Urine: NEGATIVE
Glucose, UA: NEGATIVE mg/dL
Hgb urine dipstick: NEGATIVE
Ketones, ur: 5 mg/dL — AB
Leukocytes,Ua: NEGATIVE
Nitrite: NEGATIVE
Protein, ur: NEGATIVE mg/dL
Specific Gravity, Urine: 1.024 (ref 1.005–1.030)
pH: 5 (ref 5.0–8.0)

## 2023-11-29 LAB — TROPONIN I (HIGH SENSITIVITY): Troponin I (High Sensitivity): 13 ng/L (ref <18)

## 2023-11-29 MED ORDER — SODIUM CHLORIDE 0.9 % IV BOLUS
1000.0000 mL | Freq: Once | INTRAVENOUS | Status: AC
  Administered 2023-11-29: 1000 mL via INTRAVENOUS

## 2023-11-29 MED ORDER — HEPARIN SODIUM (PORCINE) 5000 UNIT/ML IJ SOLN
5000.0000 [IU] | Freq: Three times a day (TID) | INTRAMUSCULAR | Status: DC
  Administered 2023-11-29 – 2023-11-30 (×2): 5000 [IU] via SUBCUTANEOUS
  Filled 2023-11-29 (×2): qty 1

## 2023-11-29 MED ORDER — HYDRALAZINE HCL 20 MG/ML IJ SOLN: 5.0000 mg | Freq: Four times a day (QID) | INTRAMUSCULAR | Status: DC | PRN

## 2023-11-29 MED ORDER — TRAZODONE HCL 100 MG PO TABS
200.0000 mg | ORAL_TABLET | Freq: Every evening | ORAL | Status: DC | PRN
  Administered 2023-11-29: 200 mg via ORAL
  Filled 2023-11-29: qty 2

## 2023-11-29 MED ORDER — LORAZEPAM 2 MG/ML IJ SOLN: 2.0000 mg | INTRAMUSCULAR | Status: DC | PRN

## 2023-11-29 MED ORDER — ASPIRIN 81 MG PO TBEC
81.0000 mg | DELAYED_RELEASE_TABLET | Freq: Every day | ORAL | Status: DC
  Administered 2023-11-30: 81 mg via ORAL
  Filled 2023-11-29: qty 1

## 2023-11-29 MED ORDER — VITAMIN C 500 MG PO TABS
1000.0000 mg | ORAL_TABLET | Freq: Every day | ORAL | Status: DC
  Administered 2023-11-30: 1000 mg via ORAL
  Filled 2023-11-29: qty 2

## 2023-11-29 MED ORDER — SODIUM CHLORIDE 0.9 % IV SOLN: INTRAVENOUS | Status: DC

## 2023-11-29 MED ORDER — QUETIAPINE FUMARATE 25 MG PO TABS
75.0000 mg | ORAL_TABLET | Freq: Every day | ORAL | Status: DC
  Administered 2023-11-29: 75 mg via ORAL
  Filled 2023-11-29: qty 3

## 2023-11-29 MED ORDER — INSULIN ASPART 100 UNIT/ML IJ SOLN
0.0000 [IU] | Freq: Every day | INTRAMUSCULAR | Status: DC
  Filled 2023-11-29: qty 1

## 2023-11-29 MED ORDER — METOPROLOL TARTRATE 5 MG/5ML IV SOLN: 5.0000 mg | INTRAVENOUS | Status: DC | PRN

## 2023-11-29 MED ORDER — OXYCODONE HCL 5 MG PO TABS: 10.0000 mg | ORAL_TABLET | Freq: Four times a day (QID) | ORAL | Status: DC | PRN

## 2023-11-29 MED ORDER — FENOFIBRATE 160 MG PO TABS
160.0000 mg | ORAL_TABLET | Freq: Every day | ORAL | Status: DC
  Administered 2023-11-29: 160 mg via ORAL
  Filled 2023-11-29 (×2): qty 1

## 2023-11-29 MED ORDER — BUPROPION HCL ER (XL) 150 MG PO TB24
300.0000 mg | ORAL_TABLET | Freq: Every day | ORAL | Status: DC
  Administered 2023-11-30: 300 mg via ORAL
  Filled 2023-11-29: qty 2

## 2023-11-29 MED ORDER — ONDANSETRON HCL 4 MG/2ML IJ SOLN
4.0000 mg | Freq: Four times a day (QID) | INTRAMUSCULAR | Status: DC | PRN
Start: 2023-11-29 — End: 2023-12-04

## 2023-11-29 MED ORDER — HYDROCHLOROTHIAZIDE 25 MG PO TABS
25.0000 mg | ORAL_TABLET | Freq: Every day | ORAL | Status: DC
  Administered 2023-11-30: 25 mg via ORAL
  Filled 2023-11-29: qty 1

## 2023-11-29 MED ORDER — SODIUM CHLORIDE 0.9 % IV SOLN: 12.5000 mg | Freq: Four times a day (QID) | INTRAVENOUS | Status: DC | PRN

## 2023-11-29 MED ORDER — NICOTINE 14 MG/24HR TD PT24: 14.0000 mg | MEDICATED_PATCH | Freq: Every day | TRANSDERMAL | Status: DC | PRN

## 2023-11-29 MED ORDER — PANTOPRAZOLE SODIUM 40 MG PO TBEC
40.0000 mg | DELAYED_RELEASE_TABLET | Freq: Every day | ORAL | Status: DC
Start: 2023-11-30 — End: 2023-11-30
  Administered 2023-11-30: 40 mg via ORAL
  Filled 2023-11-29: qty 1

## 2023-11-29 MED ORDER — DONEPEZIL HCL 5 MG PO TABS
10.0000 mg | ORAL_TABLET | Freq: Every day | ORAL | Status: DC
  Administered 2023-11-29: 10 mg via ORAL
  Filled 2023-11-29: qty 2

## 2023-11-29 MED ORDER — ONDANSETRON HCL 4 MG PO TABS: 4.0000 mg | ORAL_TABLET | Freq: Four times a day (QID) | ORAL | Status: DC | PRN

## 2023-11-29 MED ORDER — METOPROLOL SUCCINATE ER 25 MG PO TB24
12.5000 mg | ORAL_TABLET | Freq: Every day | ORAL | Status: DC
Start: 2023-11-29 — End: 2023-11-30
  Administered 2023-11-29 – 2023-11-30 (×2): 12.5 mg via ORAL
  Filled 2023-11-29 (×2): qty 1

## 2023-11-29 MED ORDER — ROSUVASTATIN CALCIUM 20 MG PO TABS
40.0000 mg | ORAL_TABLET | Freq: Every day | ORAL | Status: DC
  Administered 2023-11-29: 40 mg via ORAL
  Filled 2023-11-29: qty 2

## 2023-11-29 MED ORDER — DULOXETINE HCL 60 MG PO CPEP
60.0000 mg | ORAL_CAPSULE | Freq: Every day | ORAL | Status: DC
  Administered 2023-11-30: 60 mg via ORAL
  Filled 2023-11-29: qty 1

## 2023-11-29 MED ORDER — LISINOPRIL 10 MG PO TABS
20.0000 mg | ORAL_TABLET | Freq: Every day | ORAL | Status: DC
  Administered 2023-11-30: 20 mg via ORAL
  Filled 2023-11-29: qty 2

## 2023-11-29 MED ORDER — ACETAMINOPHEN 325 MG PO TABS: 650.0000 mg | ORAL_TABLET | Freq: Four times a day (QID) | ORAL | Status: DC | PRN

## 2023-11-29 MED ORDER — TAMSULOSIN HCL 0.4 MG PO CAPS
0.8000 mg | ORAL_CAPSULE | Freq: Every day | ORAL | Status: DC
  Administered 2023-11-29: 0.8 mg via ORAL
  Filled 2023-11-29: qty 2

## 2023-11-29 MED ORDER — MENTHOL 3 MG MT LOZG: 1.0000 | LOZENGE | OROMUCOSAL | Status: DC | PRN

## 2023-11-29 MED ORDER — SENNOSIDES-DOCUSATE SODIUM 8.6-50 MG PO TABS: 1.0000 | ORAL_TABLET | Freq: Every evening | ORAL | Status: DC | PRN

## 2023-11-29 MED ORDER — IOHEXOL 300 MG/ML  SOLN
100.0000 mL | Freq: Once | INTRAMUSCULAR | Status: AC | PRN
  Administered 2023-11-29: 100 mL via INTRAVENOUS

## 2023-11-29 MED ORDER — ACETAMINOPHEN 650 MG RE SUPP: 650.0000 mg | Freq: Four times a day (QID) | RECTAL | Status: DC | PRN

## 2023-11-29 MED ORDER — DIVALPROEX SODIUM ER 250 MG PO TB24
1000.0000 mg | ORAL_TABLET | Freq: Every day | ORAL | Status: DC
  Administered 2023-11-29: 1000 mg via ORAL
  Filled 2023-11-29: qty 4

## 2023-11-29 MED ORDER — OXYCODONE-ACETAMINOPHEN 10-325 MG PO TABS: 1.0000 | ORAL_TABLET | Freq: Four times a day (QID) | ORAL | Status: DC | PRN

## 2023-11-29 MED ORDER — DIPHENOXYLATE-ATROPINE 2.5-0.025 MG PO TABS
1.0000 | ORAL_TABLET | Freq: Two times a day (BID) | ORAL | Status: DC | PRN
  Administered 2023-11-30: 1 via ORAL
  Filled 2023-11-29: qty 1

## 2023-11-29 MED ORDER — ACETAMINOPHEN 325 MG PO TABS: 325.0000 mg | ORAL_TABLET | Freq: Four times a day (QID) | ORAL | Status: DC | PRN

## 2023-11-29 MED ORDER — LISINOPRIL-HYDROCHLOROTHIAZIDE 20-25 MG PO TABS: 1.0000 | ORAL_TABLET | Freq: Two times a day (BID) | ORAL | Status: DC

## 2023-11-29 MED ORDER — INSULIN ASPART 100 UNIT/ML IJ SOLN
0.0000 [IU] | Freq: Three times a day (TID) | INTRAMUSCULAR | Status: DC
  Administered 2023-11-30: 2 [IU] via SUBCUTANEOUS
  Administered 2023-11-30: 1 [IU] via SUBCUTANEOUS
  Filled 2023-11-29 (×2): qty 1

## 2023-11-29 MED ORDER — GABAPENTIN 300 MG PO CAPS
300.0000 mg | ORAL_CAPSULE | Freq: Three times a day (TID) | ORAL | Status: DC
  Administered 2023-11-29 – 2023-11-30 (×2): 300 mg via ORAL
  Filled 2023-11-29 (×2): qty 1

## 2023-11-29 NOTE — Assessment & Plan Note (Signed)
Home tamsulosin 0.4 mg daily after supper resumed

## 2023-11-29 NOTE — Assessment & Plan Note (Signed)
 Per pharmacy, patient has been prescribed gabapentin  1200 mg 3 times daily Given patient's renal function, I have renally adjusted gabapentin  to 300 3 times daily

## 2023-11-29 NOTE — Assessment & Plan Note (Addendum)
 Home bupropion  150 mg daily, duloxetine  60 mg daily, trazodone  200 mg nightly as needed for sleep resumed

## 2023-11-29 NOTE — Assessment & Plan Note (Signed)
 -  As needed nicotine patch ordered ?

## 2023-11-29 NOTE — Assessment & Plan Note (Signed)
 I suspect this is secondary to patient vomiting this morning after taking his a.m. medication including metoprolol  Home metoprolol  succinate 12.5 mg daily resumed Metoprolol  tartrate 5 mg IV every 4 hours as needed for heart rate greater than 120, 3 doses ordered

## 2023-11-29 NOTE — Hospital Course (Signed)
 [name] is a 83 year old male with history of depression, anxiety, memory decline, neuropathy, hypertension, non-insulin -dependent diabetes mellitus, GERD, BPH, who presents emergency department for chief concerns of intractable nausea and vomiting and altered mentation for 2 weeks.  Patient endorses a 12 pound weight loss in the last 2 weeks.  Vitals in the ED showed temperature of 98, respiration rate of 16, heart rate 96, blood pressure 137/73, SpO2 of 100% on room air.  Serum sodium is 137, potassium 4.5, chloride 94, bicarb 26, BUN of 49, serum creatinine 1.44, EGFR 48, nonfasting blood glucose 161, WBC 8.4, hemoglobin 12.2, platelets of 287.  UA was negative for nitrates, leukocytes, and positive for 5 of ketones.  CT abdomen pelvis with contrast: Was read as curvilinear radiodense structure traversing the lumen of the known moderate duodenal diverticulum is new since prior exam.  This could relate to prior intervention, however foreign body cannot be excluded at this time.  ED treatment: Sodium chloride  1 L bolus.

## 2023-11-29 NOTE — Assessment & Plan Note (Signed)
 Insulin SSI with at bedtime coverage ordered

## 2023-11-29 NOTE — ED Triage Notes (Addendum)
 Pt comes with c/o irregular HR. Pt HR 80-110. Pt denies any cp or sob. Pt states difficulty sleeping, nausea and weight loss. Pt states V/D.  Wife also reports some confusion.

## 2023-11-29 NOTE — ED Notes (Addendum)
 Pt called out requesting sleeping medication, at this time medications are still being processed/verified, Pt informed that once meds are available and verified by pharmacy, this nurse would be in to give requested meds. Pt states what the hell throws hands up and lays back in the bed.

## 2023-11-29 NOTE — Assessment & Plan Note (Signed)
 Gabapentin  renally adjusted to 300 mg 3 times daily Patient is also on oxycodone -acetaminophen  10-325 mg every 6 hours as needed for moderate pain Quetiapine  75 mg nightly, trazodone  200 mg nightly as needed for sleep

## 2023-11-29 NOTE — Progress Notes (Signed)
 Patient presents to triage with complaints of intractable nausea and vomiting for 2 weeks, 12 pound weight loss, diabetes (likely) out-of-control, irregular heartbeat.  To ED for further evaluation and care.

## 2023-11-29 NOTE — Assessment & Plan Note (Signed)
 Home lisinopril -hydrochlorothiazide  20-25 mg p.o. twice daily, metoprolol  succinate 12.5 mg daily, were resumed on admission Hydralazine  5 mg IV every 6 hours as needed for SBP greater 170, 5 days ordered

## 2023-11-29 NOTE — ED Provider Notes (Signed)
 [facility] Provider Note    Event Date/Time   First MD Initiated Contact with Patient 11/29/23 1304     (approximate)   History   Irregular Heart Beat   HPI  James Hood is a 83 y.o. male past medical history significant for diabetes, chronic pain, presents to the emergency department with not feeling well.  History is provided by the patient and his wife at bedside.  States that he has not been feeling well for the past week complaining of generalized not feeling well.  Complains of nausea but no episodes of vomiting.  Approximately 15 pound weight loss over the past 1 month.  Complaints of early satiety and poor appetite.  Intermittent episodes of abdominal pain but no active abdominal pain at this time.  Denies any chest pain or shortness of breath.  Denies any heart palpitations.  Denies any falls or trauma.  Denies dysuria, urinary urgency or frequency.  New headache w/vision changes.      Physical Exam   Triage Vital Signs: ED Triage Vitals  Encounter Vitals Group     BP 11/29/23 1128 (!) 126/57     Girls Systolic BP Percentile --      Girls Diastolic BP Percentile --      Boys Systolic BP Percentile --      Boys Diastolic BP Percentile --      Pulse Rate 11/29/23 1128 (!) 110     Resp 11/29/23 1128 17     Temp 11/29/23 1128 98.3 F (36.8 C)     Temp Source 11/29/23 1128 Oral     SpO2 11/29/23 1128 97 %     Weight 11/29/23 1119 165 lb (74.8 kg)     Height 11/29/23 1119 5' 10 (1.778 m)     Head Circumference --      Peak Flow --      Pain Score 11/29/23 1119 0     Pain Loc --      Pain Education --      Exclude from Growth Chart --     Most recent vital signs: Vitals:   11/30/23 0430 11/30/23 1010  BP: 104/85 116/62  Pulse: 71 66  Resp: 20 16  Temp: 98.3 F (36.8 C) 98.6 F (37 C)  SpO2: 95% 94%    Physical Exam Constitutional:      Appearance: He is well-developed.  HENT:     Head: Atraumatic.  Eyes:      Conjunctiva/sclera: Conjunctivae normal.  Cardiovascular:     Rate and Rhythm: Regular rhythm. Tachycardia present.  Pulmonary:     Effort: No respiratory distress.  Abdominal:     Tenderness: There is no abdominal tenderness.  Musculoskeletal:        General: Normal range of motion.     Cervical back: Normal range of motion.     Right lower leg: No edema.     Left lower leg: No edema.  Skin:    General: Skin is warm.     Capillary Refill: Capillary refill takes less than 2 seconds.  Neurological:     General: No focal deficit present.     Mental Status: He is alert and oriented to person, place, and time. Mental status is at baseline.  Psychiatric:        Mood and Affect: Mood normal.     IMPRESSION / MDM / ASSESSMENT AND PLAN / ED COURSE  I reviewed the triage vital signs and the nursing notes.  Differential diagnosis malignacy, cholodoco, infection, IPH, CVA, electrolyte disturbance.   EKG  I, Clotilda Punter, the attending physician, personally viewed and interpreted this ECG.   Rate: Normal  Rhythm: Normal sinus  Axis: Normal  Intervals: Normal  ST&T Change: None  No tachycardic or bradycardic dysrhythmias while on cardiac telemetry.  RADIOLOGY I independently reviewed imaging, my interpretation of imaging: CT A/P w/ mass? FB? Sturcutre noted in duodenum - no prior procedure in that area.     LABS (all labs ordered are listed, but only abnormal results are displayed) Labs interpreted as -    Labs Reviewed  COMPREHENSIVE METABOLIC PANEL WITH GFR - Abnormal; Notable for the following components:      Result Value   Chloride 94 (*)    Glucose, Bld 161 (*)    BUN 49 (*)    Creatinine, Ser 1.44 (*)    AST 47 (*)    Alkaline Phosphatase 33 (*)    Total Bilirubin 1.3 (*)    GFR, Estimated 48 (*)    Anion gap 17 (*)    All other components within normal limits  CBC - Abnormal; Notable for the following components:   RBC 3.92 (*)    Hemoglobin 12.2 (*)     HCT 36.7 (*)    All other components within normal limits  URINALYSIS, ROUTINE W REFLEX MICROSCOPIC - Abnormal; Notable for the following components:   Color, Urine YELLOW (*)    APPearance CLEAR (*)    Ketones, ur 5 (*)    All other components within normal limits  BASIC METABOLIC PANEL WITH GFR - Abnormal; Notable for the following components:   Glucose, Bld 118 (*)    BUN 42 (*)    All other components within normal limits  CBC - Abnormal; Notable for the following components:   RBC 3.62 (*)    Hemoglobin 11.4 (*)    HCT 33.4 (*)    All other components within normal limits  CBG MONITORING, ED - Abnormal; Notable for the following components:   Glucose-Capillary 105 (*)    All other components within normal limits  CBG MONITORING, ED - Abnormal; Notable for the following components:   Glucose-Capillary 128 (*)    All other components within normal limits  CBG MONITORING, ED - Abnormal; Notable for the following components:   Glucose-Capillary 156 (*)    All other components within normal limits  TROPONIN I (HIGH SENSITIVITY)     MDM  Labs with mild elevation of BUN and mild Cr elevation - given IVF and improvement of tachycardia.  UA w/o signs of infection. Labs overall reassuring - mild LFT elevation.  CT w/ abnormal finding - unclear what sturcture is in duodenum. Admit for further w/u and dehydration.      PROCEDURES:  Critical Care performed: No  Procedures  Patient's presentation is most consistent with acute presentation with potential threat to life or bodily function.   MEDICATIONS ORDERED IN ED: Medications  acetaminophen  (TYLENOL ) tablet 650 mg (has no administration in time range)    Or  acetaminophen  (TYLENOL ) suppository 650 mg (has no administration in time range)  ondansetron  (ZOFRAN ) injection 4 mg (has no administration in time range)  heparin  injection 5,000 Units (5,000 Units Subcutaneous Given 11/30/23 0627)  senna-docusate (Senokot-S)  tablet 1 tablet (has no administration in time range)  0.9 %  sodium chloride  infusion ( Intravenous Rate/Dose Verify 11/30/23 0449)  promethazine  (PHENERGAN ) 12.5 mg in sodium chloride  0.9 % 50 mL IVPB (has  no administration in time range)  hydrALAZINE  (APRESOLINE ) injection 5 mg (has no administration in time range)  fenofibrate  tablet 160 mg (160 mg Oral Given 11/29/23 2358)  metoprolol  succinate (TOPROL -XL) 24 hr tablet 12.5 mg (12.5 mg Oral Given 11/30/23 1013)  rosuvastatin  (CRESTOR ) tablet 40 mg (40 mg Oral Given 11/29/23 2312)  buPROPion  (WELLBUTRIN  XL) 24 hr tablet 300 mg (300 mg Oral Given 11/30/23 1012)  donepezil  (ARICEPT ) tablet 10 mg (10 mg Oral Given 11/29/23 2311)  DULoxetine  (CYMBALTA ) DR capsule 60 mg (60 mg Oral Given 11/30/23 1012)  QUEtiapine  (SEROQUEL ) tablet 75 mg (75 mg Oral Given 11/29/23 2350)  traZODone  (DESYREL ) tablet 200 mg (200 mg Oral Given 11/29/23 1946)  tamsulosin  (FLOMAX ) capsule 0.8 mg (0.8 mg Oral Given 11/29/23 2350)  ascorbic acid  (VITAMIN C ) tablet 1,000 mg (1,000 mg Oral Given 11/30/23 1012)  menthol -cetylpyridinium (CEPACOL) lozenge 3 mg (has no administration in time range)  nicotine  (NICODERM CQ  - dosed in mg/24 hours) patch 14 mg (has no administration in time range)  insulin  aspart (novoLOG ) injection 0-5 Units ( Subcutaneous Not Given 11/29/23 2318)  insulin  aspart (novoLOG ) injection 0-9 Units (1 Units Subcutaneous Given 11/30/23 1012)  LORazepam  (ATIVAN ) injection 2 mg (has no administration in time range)  metoprolol  tartrate (LOPRESSOR ) injection 5 mg (has no administration in time range)  aspirin  EC tablet 81 mg (81 mg Oral Given 11/30/23 1012)  divalproex  (DEPAKOTE  ER) 24 hr tablet 1,000 mg (1,000 mg Oral Given 11/29/23 2313)  gabapentin  (NEURONTIN ) capsule 300 mg (300 mg Oral Given 11/30/23 1013)  diphenoxylate -atropine  (LOMOTIL ) 2.5-0.025 MG per tablet 1 tablet (has no administration in time range)  pantoprazole  (PROTONIX ) EC tablet 40 mg (40 mg Oral  Given 11/30/23 1013)  lisinopril  (ZESTRIL ) tablet 20 mg (20 mg Oral Given 11/30/23 1013)    And  hydrochlorothiazide  (HYDRODIURIL ) tablet 25 mg (25 mg Oral Given 11/30/23 1013)  oxyCODONE  (Oxy IR/ROXICODONE ) immediate release tablet 10 mg (has no administration in time range)    And  acetaminophen  (TYLENOL ) tablet 325 mg (has no administration in time range)  sodium chloride  0.9 % bolus 1,000 mL (0 mLs Intravenous Stopped 11/29/23 1601)  iohexol  (OMNIPAQUE ) 300 MG/ML solution 100 mL (100 mLs Intravenous Contrast Given 11/29/23 1422)  gadobutrol  (GADAVIST ) 1 MMOL/ML injection 7 mL (7 mLs Intravenous Contrast Given 11/30/23 0048)    FINAL CLINICAL IMPRESSION(S) / ED DIAGNOSES   Final diagnoses:  None     Rx / DC Orders   ED Discharge Orders     None        Note:  This document was prepared using Dragon voice recognition software and may include unintentional dictation errors.   Suzanne Kirsch, MD 11/30/23 1210

## 2023-11-29 NOTE — Assessment & Plan Note (Signed)
 MRI of the brain with and without contrast have been ordered

## 2023-11-29 NOTE — Assessment & Plan Note (Addendum)
 With significant weight loss, etiology unclear at this time however given patient's age, and altered mentation, brain lesion cannot be excluded at this time MRI brain with and wo contrast have been ordered on admission Status post sodium chloride  1 L bolus Continue sodium chloride  infusion at 125 mL/h, 1 day ordered Symptomatic support ondansetron  4 mg IV every 6 hours as needed for nausea and vomiting, 5 days ordered; Phenergan  12.5 mg IV every 6 hours.  For refractory nausea and vomiting, 1 day ordered BMP in the a.m.

## 2023-11-29 NOTE — Assessment & Plan Note (Addendum)
 Gabapentin  300 milligrams 3 times daily

## 2023-11-29 NOTE — H&P (Addendum)
 History and Physical   James Hood FMW:[id number] DOB: June 09, 1940 DOA: 11/29/2023  PCP: Gretel App, NP  Outpatient Specialists: Dr. Lane, neurology Patient coming from: [facility]  I have personally briefly reviewed patient's old medical records in [facility] EMR.  Chief Concern: Intractable nausea and vomiting, altered mentation with weight loss  HPI: [name] is a 83 year old male with history of depression, anxiety, memory decline, neuropathy, hypertension, non-insulin -dependent diabetes mellitus, GERD, BPH, who presents emergency department for chief concerns of intractable nausea and vomiting and altered mentation for 2 weeks.  Patient endorses a 12 pound weight loss in the last 2 weeks.  Vitals in the ED showed temperature of 98, respiration rate of 16, heart rate 96, blood pressure 137/73, SpO2 of 100% on room air.  Serum sodium is 137, potassium 4.5, chloride 94, bicarb 26, BUN of 49, serum creatinine 1.44, EGFR 48, nonfasting blood glucose 161, WBC 8.4, hemoglobin 12.2, platelets of 287.  UA was negative for nitrates, leukocytes, and positive for 5 of ketones.  CT abdomen pelvis with contrast: Was read as curvilinear radiodense structure traversing the lumen of the known moderate duodenal diverticulum is new since prior exam.  This could relate to prior intervention, however foreign body cannot be excluded at this time.  ED treatment: Sodium chloride  1 L bolus. ------------------------------- At bedside, patient able to tell me his first and last name, age, location, current calendar year.  He denies trauma to his person.    Patient reports that over the last 2 weeks, he has not had any appetite and has had chronic nausea.  He has only vomited 1 time and it was this morning.  Of note, spouse reports that yesterday, he endorsed a headache which is very new and strange for the patient.  Patient rarely if has ever had any headaches.  Furthermore, she  endorses that over the last 2 to 3 days, he has been noticing double vision especially when he drives.  This is also new for the patient.  Social history: He lives at home with his wife.  He denies EtOH, recreational drug use.  He currently smokes about half a pack per day.  He is retired and formally worked in [employer], working as a Diplomatic Services operational officer.  ROS: Constitutional: + weight change (lost 12 pounds in 2 weeks), no fever ENT/Mouth: no sore throat, no rhinorrhea Eyes: no eye pain, no vision changes Cardiovascular: no chest pain, no dyspnea,  no edema, no palpitations Respiratory: no cough, no sputum, no wheezing Gastrointestinal: + nausea, + vomiting, no diarrhea, no constipation Genitourinary: no urinary incontinence, no dysuria, no hematuria Musculoskeletal: no arthralgias, no myalgias Skin: no skin lesions, no pruritus, Neuro: + weakness, no loss of consciousness, no syncope Psych: no anxiety, no depression, + decrease appetite Heme/Lymph: no bruising, no bleeding  ED Course: Discussed with EDP, patient requiring hospitalization for chief concerns of intractable nausea and vomiting and 12 pound weight loss.  Assessment/Plan  Principal Problem:   Intractable nausea and vomiting Active Problems:   Hypertension   Anxiety and depression   DJD (degenerative joint disease) of knee   New onset headache   Benign prostatic hyperplasia   Type 2 diabetes mellitus (HCC)   HLD (hyperlipidemia)   Tobacco abuse   Neuropathy   Weight loss   RLS (restless legs syndrome)   Tachycardia   At risk for polypharmacy   Assessment and Plan:  * Intractable nausea and vomiting With significant weight loss, etiology unclear at this  time however given patient's age, and altered mentation, brain lesion cannot be excluded at this time MRI brain with and wo contrast have been ordered on admission Status post sodium chloride  1 L bolus Continue sodium chloride  infusion at 125 mL/h, 1 day  ordered Symptomatic support ondansetron  4 mg IV every 6 hours as needed for nausea and vomiting, 5 days ordered; Phenergan  12.5 mg IV every 6 hours.  For refractory nausea and vomiting, 1 day ordered BMP in the a.m.  At risk for polypharmacy Gabapentin  renally adjusted to 300 mg 3 times daily Patient is also on oxycodone -acetaminophen  10-325 mg every 6 hours as needed for moderate pain Quetiapine  75 mg nightly, trazodone  200 mg nightly as needed for sleep  Tachycardia I suspect this is secondary to patient vomiting this morning after taking his a.m. medication including metoprolol  Home metoprolol  succinate 12.5 mg daily resumed Metoprolol  tartrate 5 mg IV every 4 hours as needed for heart rate greater than 120, 3 doses ordered  RLS (restless legs syndrome) Gabapentin  300 milligrams 3 times daily  Weight loss With new onset headache and double vision MRI brain w wo contrast has been ordered  Neuropathy Per pharmacy, patient has been prescribed gabapentin  1200 mg 3 times daily Given patient's renal function, I have renally adjusted gabapentin  to 300 3 times daily  Tobacco abuse As needed nicotine  patch ordered  Type 2 diabetes mellitus (HCC) Insulin  SSI with at bedtime coverage ordered  Benign prostatic hyperplasia Home tamsulosin  0.4 mg daily after supper resumed  New onset headache MRI of the brain with and without contrast have been ordered  Anxiety and depression Home bupropion  150 mg daily, duloxetine  60 mg daily, trazodone  200 mg nightly as needed for sleep resumed  Hypertension Home lisinopril -hydrochlorothiazide  20-25 mg p.o. twice daily, metoprolol  succinate 12.5 mg daily, were resumed on admission Hydralazine  5 mg IV every 6 hours as needed for SBP greater 170, 5 days ordered  Chart reviewed.   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   Colon polyps    Complication of anesthesia    Woke during first cataract procedure   Dementia (HCC)    Depression    Diabetes mellitus type 2 in nonobese (HCC)    GERD (gastroesophageal reflux disease)    BARRETTS ESOPHAGUS RESOLVED PER PATIENT   Headache    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HCC)    History of hiatal hernia    Hypercholesteremia    Hypertension    Hyperthyroidism    Intractable nausea and vomiting 11/29/2023   Neuropathic pain    Primary localized osteoarthritis of right knee    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   Stomach ulcer    Tremor, essential    Wears dentures    full upper   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/02/2018   Past Surgical History:  Procedure Laterality Date   ANKLE ARTHROSCOPY Left 09/29/2019   Procedure: LEFT ANKLE ARTHROSCOPY, DEBRIDEMENT;  Surgeon: Harden Jerona GAILS, MD;  Location: [facility];  Service: Orthopedics;  Laterality: Left;   BACK SURGERY     BIOPSY  01/18/2023   Procedure: BIOPSY;  Surgeon: Onita Elspeth Sharper, DO;  Location: [facility] ENDOSCOPY;  Service: Gastroenterology;;   CATARACT EXTRACTION W/PHACO Right 03/12/2023   Procedure: CATARACT EXTRACTION PHACO AND INTRAOCULAR LENS PLACEMENT (IOC) RIGHT DIABETIC 11.25 01:07.6;  Surgeon: Jaye Fallow, MD;  Location: [facility];  Service: Ophthalmology;  Laterality: Right;   CATARACT EXTRACTION W/PHACO Left 03/25/2023   Procedure: CATARACT EXTRACTION PHACO AND  INTRAOCULAR LENS PLACEMENT (IOC) LEFT DIABETIC 7.15 00:51.0;  Surgeon: Jaye Fallow, MD;  Location: [facility];  Service: Ophthalmology;  Laterality: Left;   COLONOSCOPY WITH PROPOFOL  N/A 09/14/2016   Procedure: COLONOSCOPY WITH PROPOFOL ;  Surgeon: Lamar ONEIDA Holmes, MD;  Location: [facility] ENDOSCOPY;  Service: Endoscopy;  Laterality: N/A;   esophageal stretch     ESOPHAGOGASTRODUODENOSCOPY (EGD) WITH PROPOFOL  N/A 12/13/2020   Procedure: ESOPHAGOGASTRODUODENOSCOPY (EGD) WITH PROPOFOL ;  Surgeon: Maryruth Ole ONEIDA, MD;  Location: [facility] ENDOSCOPY;  Service: Endoscopy;  Laterality: N/A;  IDDM   ESOPHAGOGASTRODUODENOSCOPY (EGD) WITH PROPOFOL  N/A 01/18/2023   Procedure: ESOPHAGOGASTRODUODENOSCOPY (EGD) WITH PROPOFOL ;  Surgeon: Onita Elspeth Sharper, DO;  Location: [facility] END[facility]COPY;  Service: Gastroenterology;  Laterality: N/A;   JOINT REPLACEMENT Right 2016   knee   LUMBAR LAMINECTOMY/DECOMPRESSION MICRODISCECTOMY Left 02/03/2016   Procedure: LEFT L5-S1 DISKECTOMY;  Surgeon: Catalina Stains, MD;  Location: [facility]O [facility];  Service: Neurosurgery;  Laterality: Left;  LEFT L5-S1 DISKECTOMY   MALONEY DILATION  01/18/2023   Procedure: MALONEY DILATION;  Surgeon: Onita Elspeth Sharper, DO;  Location: [facility] ENDOSCOPY;  Service: Gastroenterology;;   PULSE GENERATOR IMPLANT N/A 08/21/2017   Procedure: UNILATERAL PULSE GENERATOR IMPLANT;  Surgeon: Clois Fret, MD;  Location: [facility] ORS;  Service: Neurosurgery;  Laterality: N/A;   PULSE GENERATOR IMPLANT Right 04/02/2018   Procedure: REMOVAL OF PULSE GENERATOR IMPLANT AND LEADS;  Surgeon: Clois Fret, MD;  Location: [facility] ORS;  Service: Neurosurgery;  Laterality: Right;   TONSILLECTOMY     TOTAL KNEE ARTHROPLASTY Right 11/15/2014   Procedure: TOTAL KNEE ARTHROPLASTY;  Surgeon: Lamar Millman, MD;  Location: [facility] OR;  Service: Orthopedics;  Laterality: Right;   Social History:  reports that he has been smoking cigarettes. He started smoking about 68 years  ago. He has a 34.3 pack-year smoking history. He has been exposed to tobacco smoke. He has never used smokeless tobacco. He reports that he does not currently use alcohol . He reports that he does not use drugs.  No Known Allergies Family History  Problem Relation Age of Onset   Heart attack Mother    Heart disease Mother    Hypertension Father    Depression Father    Kidney cancer Neg Hx    Prostate cancer Neg Hx    Family history: Family history reviewed and not pertinent.  Prior to Admission medications   Medication Sig Start Date End Date Taking? Authorizing Provider  aspirin  EC 81 MG tablet Take 81 mg by mouth daily.    [provider]  buPROPion  (WELLBUTRIN  XL) 150 MG 24 hr tablet Take 1 tablet (150 mg total) by mouth daily. 09/30/23   Gretel App, NP  buPROPion  (WELLBUTRIN  XL) 300 MG 24 hr tablet Take 1 tablet (300 mg total) by mouth daily. Patient not taking: Reported on 10/21/2023 10/21/23 01/19/24  Hisada, Reina, MD  cholestyramine (QUESTRAN) 4 g packet Take 1 packet by mouth 2 (two) times daily. 09/24/21  [provider]  Continuous Glucose Receiver (FREESTYLE LIBRE 2 READER) DEVI Use to check glucose at least every 8 hours 04/10/23   Maribeth Camellia MATSU, MD  Continuous Glucose Sensor (FREESTYLE LIBRE 2 SENSOR) MISC Apply every 14 days to check glucose 04/10/23   Maribeth Camellia MATSU, MD  cyclobenzaprine  (FLEXERIL ) 10 MG tablet Take 1 tablet (10 mg total) by mouth 3 (three) times daily as needed for muscle spasms. 07/02/23   Gretel App, NP  dapagliflozin  propanediol (FARXIGA ) 10 MG TABS tablet Take 10 mg by mouth daily.    [provider]  diphenoxylate -atropine  (LOMOTIL ) 2.5-0.025 MG tablet Take by mouth. 10/31/21   [provider]  divalproex  (DEPAKOTE  ER) 500 MG 24 hr tablet Take 1 tablet by mouth 2 (two) times daily. 03/20/18   [provider]  donepezil  (ARICEPT ) 10 MG tablet Take 10 mg by mouth at bedtime.    [provider]   DULoxetine  (CYMBALTA ) 30 MG capsule Take 2 capsules (60 mg total) by mouth daily. 09/02/23   Gretel App, NP  fenofibrate  (TRICOR ) 145 MG tablet TAKE 1 TABLET BY MOUTH DAILY 06/10/23   Maribeth Camellia MATSU, MD  gabapentin  (NEURONTIN ) 600 MG tablet TAKE TWO TABLETS BY MOUTH THREE TIMES A DAY 11/02/19   Maribeth Camellia MATSU, MD  lidocaine  (LIDODERM ) 5 % Place 1 patch onto the skin every 12 (twelve) hours. Remove & Discard patch within 12 hours or as directed by MD 08/16/22   Alvia Selinda PARAS, MD  linagliptin  (TRADJENTA ) 5 MG TABS tablet Take 1 tablet (5 mg total) by mouth daily. 11/27/23   Gretel App, NP  lisinopril -hydrochlorothiazide  (ZESTORETIC ) 20-25 MG tablet Take 1 tablet by mouth 2 (two) times daily. 08/19/23   Gretel App, NP  memantine  (NAMENDA ) 10 MG tablet Take 1 tablet by mouth daily. 10/18/20   [provider]  metFORMIN  (GLUCOPHAGE -XR) 500 MG 24 hr tablet TAKE 1 TABLET BY MOUTH 2 TIMES A DAY 07/04/23   Gretel App, NP  metoprolol  succinate (TOPROL -XL) 25 MG 24 hr tablet Take 0.5 tablets (12.5 mg total) by mouth daily. 09/30/23   Gretel App, NP  Multiple Vitamins-Minerals (CENTRUM SILVER PO) Take 1 tablet by mouth daily.    [provider]  nystatin  (MYCOSTATIN ) 100000 UNIT/ML suspension Take 5 mLs (500,000 Units total) by mouth 4 (four) times daily. Swish in the mouth and retain for as long as possible (several minutes) before swallowing. Use for 7-14 days. 09/04/23   Gretel App, NP  omega-3 acid ethyl esters (LOVAZA ) 1 g capsule TAKE 2 CAPSULES BY MOUTH TWICE A DAY 11/07/22   Maribeth Camellia MATSU, MD  omeprazole  (PRILOSEC ) 20 MG capsule Take 20 mg by mouth daily. 04/01/16   [provider]  oxyCODONE -acetaminophen  (PERCOCET) 10-325 MG tablet Take 1 tablet by mouth every 6 (six) hours as needed for pain. 11/02/23 12/02/23  Patel, Seema K, NP  oxyCODONE -acetaminophen  (PERCOCET) 10-325 MG tablet Take 1 tablet by mouth every 6 (six) hours as needed for pain. 12/02/23 01/01/24   Patel, Seema K, NP  pantoprazole  (PROTONIX ) 40 MG tablet Take 40 mg by mouth daily. 07/14/22   [provider]  pramipexole  (MIRAPEX ) 0.125 MG tablet TAKE 1 TABLET BY MOUTH EVERY NIGHT 2-3 HOURS BEFORE BEDTIME 04/09/23   Maribeth Camellia MATSU, MD  primidone  (MYSOLINE ) 50 MG tablet Take 150-250 mg by mouth 2 (two) times daily. Take 250 mg by mouth in the morning & take 150 mg at night.    [provider]  QUEtiapine  (SEROQUEL ) 25  MG tablet Take 75 mg by mouth at bedtime.    [provider]  rosuvastatin  (CRESTOR ) 40 MG tablet Take 1 tablet (40 mg total) by mouth daily. 08/26/23   Gretel App, NP  tamsulosin  (FLOMAX ) 0.4 MG CAPS capsule TAKE 2 CAPSULES BY MOUTH DAILY AFTER SUPPER 07/23/22   Maribeth Camellia MATSU, MD  traZODone  (DESYREL ) 100 MG tablet Take 2 tablets (200 mg total) by mouth at bedtime as needed for sleep. 08/29/23 02/25/24  Vickey Mettle, MD  Trospium  Chloride 60 MG CP24 Take 1 capsule (60 mg total) by mouth daily. 04/02/23   Francisca Redell BROCKS, MD  vitamin C  (ASCORBIC ACID ) 500 MG tablet Take 1,000 mg by mouth daily.    [provider]   Physical Exam: Vitals:   11/29/23 1119 11/29/23 1128 11/29/23 1556  BP:  (!) 126/57 137/74  Pulse:  (!) 110 96  Resp:  17 16  Temp:  98.3 F (36.8 C) 98 F (36.7 C)  TempSrc:  Oral Oral  SpO2:  97% 100%  Weight: 74.8 kg    Height: 5' 10 (1.778 m)     Constitutional: appears age-appropriate, frail, calm Eyes: PERRL, lids and conjunctivae normal ENMT: Mucous membranes are moist. Posterior pharynx clear of any exudate or lesions. Age-appropriate dentition. Hearing appropriate Neck: normal, supple, no masses, no thyromegaly Respiratory: clear to auscultation bilaterally, no wheezing, no crackles. Normal respiratory effort. No accessory muscle use.  Cardiovascular: Regular rate and rhythm, no murmurs / rubs / gallops. No extremity edema. 2+ pedal pulses. No carotid bruits.  Abdomen: Scaphoid abdomen, no tenderness, no  masses palpated, no hepatosplenomegaly. Bowel sounds positive.  Musculoskeletal: no clubbing / cyanosis. No joint deformity upper and lower extremities. Good ROM, no contractures, no atrophy. Normal muscle tone.  Skin: no rashes, lesions, ulcers. No induration Neurologic: Sensation intact. Strength 5/5 in all 4.  Psychiatric: Normal judgment and insight. Alert and oriented x 3. Normal mood.  Flat affect  EKG: independently reviewed, showing sinus tachycardia with rate of 99, QTc 488  Chest x-ray on Admission: I personally reviewed and I agree with radiologist reading as below.  CT ABDOMEN PELVIS W CONTRAST Result Date: 11/29/2023 CLINICAL DATA:  Abdominal pain, acute, nonlocalized n/v, weight los, abd pain EXAM: CT ABDOMEN AND PELVIS WITH CONTRAST TECHNIQUE: Multidetector CT imaging of the abdomen and pelvis was performed using the standard protocol following bolus administration of intravenous contrast. RADIATION DOSE REDUCTION: This exam was performed according to the departmental dose-optimization program which includes automated exposure control, adjustment of the mA and/or kV according to patient size and/or use of iterative reconstruction technique. CONTRAST:  OMNIPAQUE  IOHEXOL  300 MG/ML  SOLN COMPARISON:  CT abdomen/pelvis dated 01/11/2017. FINDINGS: Lower chest: No acute abnormality. Hepatobiliary: No focal liver abnormality is seen. No gallstones, gallbladder wall thickening, or biliary dilatation. Pancreas: Unremarkable. No pancreatic ductal dilatation or surrounding inflammatory changes. Spleen: Normal in size.  Punctate calcified granulomas. Adrenals/Urinary Tract: Adrenal glands are unremarkable. No urolithiasis or hydronephrosis. Bladder is unremarkable. Stomach/Bowel: Stomach is under distended and otherwise grossly unremarkable. Curvilinear radiodense structure traversing the lumen of a known moderate duodenal diverticulum is new since the prior exam (series 2, images 35-37 and  series 5, images 37-39). No obstruction or focal inflammatory changes. Appendix appears normal. Descending and sigmoid colonic diverticulosis without evidence of acute diverticulitis. Vascular/Lymphatic: Abdominal aorta is normal in caliber with atherosclerotic calcification. No enlarged abdominal or pelvic lymph nodes. Reproductive: Prostate is unremarkable. Other: No abdominal wall hernia or abnormality. No abdominopelvic ascites.  No intraperitoneal free air. Musculoskeletal: Levocurvature of the mid lumbar spine. Moderate to advanced degenerative changes of the thoracolumbar spine, most pronounced at L3-S1. No suspicious osseous lesion. IMPRESSION: 1. Curvilinear radiodense structure traversing the lumen of a known moderate duodenal diverticulum is new since the prior exam. This could relate to prior intervention, however, foreign body cannot be excluded. Clinical correlation is recommended. No evidence of obstruction or focal inflammatory changes. 2. Descending and sigmoid colonic diverticulosis without evidence of acute diverticulitis. 3.  Aortic Atherosclerosis (ICD10-I70.0). Electronically Signed   By: Harrietta Sherry M.D.   On: 11/29/2023 15:21   CT Head Wo Contrast Result Date: 11/29/2023 EXAM: CT HEAD WITHOUT 11/29/2023 12:15:00 PM TECHNIQUE: CT of the head was performed without the administration of intravenous contrast. Automated exposure control, iterative reconstruction, and/or weight based adjustment of the mA/kV was utilized to reduce the radiation dose to as low as reasonably achievable. COMPARISON: CT head 11/06/2023, MRI brain 07/11/2020 CLINICAL HISTORY: AMS. Pt comes with c/o irregular HR. Pt HR 80-110. Pt denies any CP or SOB. Pt states difficulty sleeping, nausea and weight loss. Pt states V/D. Wife also reports some confusion. FINDINGS: BRAIN AND VENTRICLES: No acute intracranial hemorrhage. No mass effect or midline shift. No extra-axial fluid collection. Gray-white differentiation is  maintained. No hydrocephalus. Generalized volume loss, mildly advanced for age but unchanged. 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 ORBITS: No acute abnormality. SINUSES AND MASTOIDS: No acute abnormality. SOFT TISSUES AND SKULL: No acute skull fracture. No acute soft tissue abnormality. IMPRESSION: 1. No acute intracranial abnormality. 2. Generalized volume loss, mildly advanced for age but unchanged. 3. 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 Electronically signed by: Ryan Chess MD 11/29/2023 12:39 PM EDT RP Workstation: [id number]   Labs on Admission: I have personally reviewed following labs  CBC: Recent Labs  Lab 11/29/23 1128  WBC 8.4  HGB 12.2*  HCT 36.7*  MCV 93.6  PLT 287   Basic Metabolic Panel: Recent Labs  Lab 11/29/23 1128  NA 137  K 4.5  CL 94*  CO2 26  GLUCOSE 161*  BUN 49*  CREATININE 1.44*  CALCIUM  10.0   GFR: Estimated Creatinine Clearance: 40.1 mL/min (A) (by C-G formula based on SCr of 1.44 mg/dL (H)).  Liver Function Tests: Recent Labs  Lab 11/29/23 1128  AST 47*  ALT 30  ALKPHOS 33*  BILITOT 1.3*  PROT 7.0  ALBUMIN 3.9   Urine analysis:    Component Value Date/Time   COLORURINE YELLOW (A) 11/29/2023 1128   APPEARANCEUR CLEAR (A) 11/29/2023 1128   APPEARANCEUR Clear 07/14/2020 1322   LABSPEC 1.024 11/29/2023 1128   PHURINE 5.0 11/29/2023 1128   GLUCOSEU NEGATIVE 11/29/2023 1128   HGBUR NEGATIVE 11/29/2023 1128   BILIRUBINUR NEGATIVE 11/29/2023 1128   BILIRUBINUR Negative 07/14/2020 1322   KETONESUR 5 (A) 11/29/2023 1128   PROTEINUR NEGATIVE 11/29/2023 1128   UROBILINOGEN 0.2 07/20/2019 1608   NITRITE NEGATIVE 11/29/2023 1128   LEUKOCYTESUR NEGATIVE 11/29/2023 1128   This document was prepared using Dragon Voice Recognition software and may include unintentional dictation errors.  Dr. Sherre [facility]  If 7PM-7AM, please contact overnight-coverage provider If 7AM-7PM, please contact day  attending provider [email or web address]  11/29/2023, 6:51 PM

## 2023-11-29 NOTE — Assessment & Plan Note (Signed)
 With new onset headache and double vision MRI brain w wo contrast has been ordered

## 2023-11-30 ENCOUNTER — Inpatient Hospital Stay

## 2023-11-30 DIAGNOSIS — E860 Dehydration: Secondary | ICD-10-CM | POA: Diagnosis not present

## 2023-11-30 DIAGNOSIS — R112 Nausea with vomiting, unspecified: Secondary | ICD-10-CM | POA: Diagnosis not present

## 2023-11-30 DIAGNOSIS — R110 Nausea: Secondary | ICD-10-CM | POA: Diagnosis not present

## 2023-11-30 LAB — CBC
HCT: 33.4 % — ABNORMAL LOW (ref 39.0–52.0)
Hemoglobin: 11.4 g/dL — ABNORMAL LOW (ref 13.0–17.0)
MCH: 31.5 pg (ref 26.0–34.0)
MCHC: 34.1 g/dL (ref 30.0–36.0)
MCV: 92.3 fL (ref 80.0–100.0)
Platelets: 250 K/uL (ref 150–400)
RBC: 3.62 MIL/uL — ABNORMAL LOW (ref 4.22–5.81)
RDW: 13.3 % (ref 11.5–15.5)
WBC: 6.6 K/uL (ref 4.0–10.5)
nRBC: 0 % (ref 0.0–0.2)

## 2023-11-30 LAB — CBG MONITORING, ED
Glucose-Capillary: 128 mg/dL — ABNORMAL HIGH (ref 70–99)
Glucose-Capillary: 156 mg/dL — ABNORMAL HIGH (ref 70–99)

## 2023-11-30 LAB — BASIC METABOLIC PANEL WITH GFR
Anion gap: 8 (ref 5–15)
BUN: 42 mg/dL — ABNORMAL HIGH (ref 8–23)
CO2: 30 mmol/L (ref 22–32)
Calcium: 9.3 mg/dL (ref 8.9–10.3)
Chloride: 98 mmol/L (ref 98–111)
Creatinine, Ser: 1.2 mg/dL (ref 0.61–1.24)
GFR, Estimated: >60 mL/min
Glucose, Bld: 118 mg/dL — ABNORMAL HIGH (ref 70–99)
Potassium: 3.8 mmol/L (ref 3.5–5.1)
Sodium: 136 mmol/L (ref 135–145)

## 2023-11-30 MED ORDER — GADOBUTROL 1 MMOL/ML IV SOLN
7.0000 mL | Freq: Once | INTRAVENOUS | Status: AC | PRN
  Administered 2023-11-30: 7 mL via INTRAVENOUS

## 2023-11-30 NOTE — Discharge Summary (Addendum)
 Physician Discharge Summary   Patient: James Hood MRN: [id number] DOB: 12-13-1940  Admit date:     11/29/2023  Discharge date: 11/30/2023  Discharge Physician: Burnard DELENA Cunning   PCP: Gretel App, NP   Recommendations at discharge:    Follow up with Primary Care in 1-2 weeks Follow up outpatient with Gastroenterology Repeat CBC, CMP at follow up  Discharge Diagnoses: Active Problems:   Hypertension   Anxiety and depression   DJD (degenerative joint disease) of knee   Benign prostatic hyperplasia   Type 2 diabetes mellitus (HCC)   HLD (hyperlipidemia)   Tobacco abuse   Neuropathy   Weight loss   RLS (restless legs syndrome)   At risk for polypharmacy  Principal Problem (Resolved):   Intractable nausea and vomiting Resolved Problems:   New onset headache   Tachycardia   Nausea & vomiting  Hospital Course: Mr. Alan Drummer is a 83 year old male with history of depression, anxiety, memory decline, neuropathy, hypertension, non-insulin -dependent diabetes mellitus, GERD, BPH, who presents emergency department for chief concerns of intractable nausea and vomiting and altered mentation for 2 weeks.   Patient endorses a 12 pound weight loss in the last 2 weeks.   Vitals in the ED showed temperature of 98, respiration rate of 16, heart rate 96, blood pressure 137/73, SpO2 of 100% on room air.   Serum sodium is 137, potassium 4.5, chloride 94, bicarb 26, BUN of 49, serum creatinine 1.44, EGFR 48, nonfasting blood glucose 161, WBC 8.4, hemoglobin 12.2, platelets of 287.   UA was negative for nitrates, leukocytes, and positive for 5 of ketones.   CT abdomen pelvis with contrast: Was read as curvilinear radiodense structure traversing the lumen of the known moderate duodenal diverticulum is new since prior exam.  This could relate to prior intervention, however foreign body cannot be excluded at this time. See H&P for full HPI on admission & ED course.   Patient was  admitted for observation and further evaluation and management as outlined in detail below.  7/12 -- pt feels much better, tolerating diet today without nausea/vomiting, no abdominal pain.  I discussed with CT finding with ?foreign body  with on-call GI who stated EGD would not be pursued unless complete obstruction.  Given pt tolerating diet without issues, recommended outpatient follow up to include colonoscopy for cancer screening, given unintentional weight loss.  Patient is clinically improved and medically stable for discharge today.  He and wife are agreeable.    Assessment and Plan:  * Intractable nausea and vomiting - resolved With significant weight loss, etiology unclear at this time however given patient's age, and altered mentation, brain lesion cannot be excluded at this time MRI brain negative for stroke or other central findings to explain N/V Treated with IV fluids  Patient tolerating diet today without pain or N/V IV Zofran  and/or Phenergan  were used PRN  Unintentional weight loss -- no signs of malignancy on imaging here Follow up with GI for outpatient colonoscopy Optimize nutrition, protein intake   At risk for polypharmacy On gabapentin  , Percocet, Quetiapine , trazodone   Recommend careful monitoring and minimizing sedating and anticholinergic meds as much as possible   Tachycardia - resolved  Likely secondary to patient vomiting  On metoprolol     RLS (restless legs syndrome) -Continue gabapentin     Neuropathy Gabapentin  adjusted for renal function   Tobacco abuse As needed nicotine  patch ordered   Type 2 diabetes mellitus (HCC) Insulin  SSI with at bedtime coverage ordered   Benign  prostatic hyperplasia Home tamsulosin  0.4 mg daily after supper resumed   New onset headache MRI of the brain negative Headache resolved, suspect due to vomiting and retching   Anxiety and depression On bupropion , duloxetine , trazodone     Hypertension Continue  lisinopril -hydrochlorothiazide , metoprolol        Consultants: None (GI was curb-sided) Procedures performed: None  Disposition: Home Diet recommendation:  Discharge Diet Orders (From admission, onward)     Start     Ordered   11/30/23 0000  Diet - low sodium heart healthy        11/30/23 1356            DISCHARGE MEDICATION: Allergies as of 11/30/2023   No Known Allergies      Medication List     TAKE these medications    ascorbic acid  500 MG tablet Commonly known as: VITAMIN C  Take 1,000 mg by mouth daily.   aspirin  EC 81 MG tablet Take 81 mg by mouth daily.   buPROPion  150 MG 24 hr tablet Commonly known as: Wellbutrin  XL Take 1 tablet (150 mg total) by mouth daily. What changed: Another medication with the same name was removed. Continue taking this medication, and follow the directions you see here.   CENTRUM SILVER PO Take 1 tablet by mouth daily.   cholestyramine 4 g packet Commonly known as: QUESTRAN Take 1 packet by mouth 2 (two) times daily.   cyclobenzaprine  10 MG tablet Commonly known as: FLEXERIL  Take 1 tablet (10 mg total) by mouth 3 (three) times daily as needed for muscle spasms.   diphenoxylate -atropine  2.5-0.025 MG tablet Commonly known as: LOMOTIL  Take by mouth.   divalproex  500 MG 24 hr tablet Commonly known as: DEPAKOTE  ER Take 1 tablet by mouth 2 (two) times daily.   donepezil  10 MG tablet Commonly known as: ARICEPT  Take 10 mg by mouth at bedtime.   DULoxetine  30 MG capsule Commonly known as: CYMBALTA  Take 2 capsules (60 mg total) by mouth daily.   Farxiga  10 MG Tabs tablet Generic drug: dapagliflozin  propanediol Take 10 mg by mouth daily.   FreeStyle Libre 2 Reader [employer] Use to check glucose at least every 8 hours   FreeStyle Libre 2 Sensor Misc Apply every 14 days to check glucose   gabapentin  600 MG tablet Commonly known as: NEURONTIN  TAKE TWO TABLETS BY MOUTH THREE TIMES A DAY   lidocaine  5 % Commonly known  as: Lidoderm  Place 1 patch onto the skin every 12 (twelve) hours. Remove & Discard patch within 12 hours or as directed by MD   linagliptin  5 MG Tabs tablet Commonly known as: Tradjenta  Take 1 tablet (5 mg total) by mouth daily.   lisinopril -hydrochlorothiazide  20-25 MG tablet Commonly known as: ZESTORETIC  Take 1 tablet by mouth 2 (two) times daily.   memantine  10 MG tablet Commonly known as: NAMENDA  Take 1 tablet by mouth daily.   metFORMIN  500 MG 24 hr tablet Commonly known as: GLUCOPHAGE -XR TAKE 1 TABLET BY MOUTH 2 TIMES A DAY   metoprolol  succinate 25 MG 24 hr tablet Commonly known as: TOPROL -XL Take 0.5 tablets (12.5 mg total) by mouth daily.   nystatin  100000 UNIT/ML suspension Commonly known as: MYCOSTATIN  Take 5 mLs (500,000 Units total) by mouth 4 (four) times daily. Swish in the mouth and retain for as long as possible (several minutes) before swallowing. Use for 7-14 days.   omega-3 acid ethyl esters 1 g capsule Commonly known as: LOVAZA  TAKE 2 CAPSULES BY MOUTH TWICE A DAY  omeprazole  20 MG capsule Commonly known as: PRILOSEC  Take 20 mg by mouth daily.   oxyCODONE -acetaminophen  10-325 MG tablet Commonly known as: PERCOCET Take 1 tablet by mouth every 6 (six) hours as needed for pain.   pantoprazole  40 MG tablet Commonly known as: PROTONIX  Take 40 mg by mouth daily.   pramipexole  0.125 MG tablet Commonly known as: MIRAPEX  TAKE 1 TABLET BY MOUTH EVERY NIGHT 2-3 HOURS BEFORE BEDTIME   primidone  50 MG tablet Commonly known as: MYSOLINE  Take 150-250 mg by mouth 2 (two) times daily. Take 250 mg by mouth in the morning & take 150 mg at night.   QUEtiapine  25 MG tablet Commonly known as: SEROQUEL  Take 75 mg by mouth at bedtime.   rosuvastatin  40 MG tablet Commonly known as: CRESTOR  Take 1 tablet (40 mg total) by mouth daily.   tamsulosin  0.4 MG Caps capsule Commonly known as: FLOMAX  TAKE 2 CAPSULES BY MOUTH DAILY AFTER SUPPER   traZODone  100 MG  tablet Commonly known as: DESYREL  Take 2 tablets (200 mg total) by mouth at bedtime as needed for sleep.   Trospium  Chloride 60 MG Cp24 Take 1 capsule (60 mg total) by mouth daily.       ASK your doctor about these medications    oxyCODONE -acetaminophen  10-325 MG tablet Commonly known as: PERCOCET Take 1 tablet by mouth every 6 (six) hours as needed for pain. Ask about: Should I take this medication?        Discharge Exam: Filed Weights   11/29/23 1119  Weight: 74.8 kg   General exam: awake, alert, no acute distress HEENT:moist mucus membranes, hearing grossly normal  Respiratory system: CTAB, no wheezes, rales or rhonchi, normal respiratory effort. Cardiovascular system: normal S1/S2, RRR Gastrointestinal system: soft, NT, ND, no HSM felt, +bowel sounds. Central nervous system: A&O x 3. no gross focal neurologic deficits, normal speech Extremities: moves all, no edema, normal tone Skin: dry, intact, normal temperature Psychiatry: normal mood, congruent affect, judgement and insight appear normal   Condition at discharge: stable  The results of significant diagnostics from this hospitalization (including imaging, microbiology, ancillary and laboratory) are listed below for reference.   Imaging Studies: MR BRAIN W WO CONTRAST Result Date: 11/30/2023 CLINICAL DATA:  Initial evaluation for acute mental status change, weight loss. EXAM: MRI HEAD WITHOUT AND WITH CONTRAST TECHNIQUE: Multiplanar, multiecho pulse sequences of the brain and surrounding structures were obtained without and with intravenous contrast. CONTRAST:  7mL GADAVIST  GADOBUTROL  1 MMOL/ML IV SOLN COMPARISON:  CT from 11/29/2023 FINDINGS: Brain: Diffuse prominence of the CSF containing spaces compatible with generalized cerebral atrophy, fairly advanced in nature. Patchy T2/FLAIR hyperintensity involving the periventricular deep white matter both cerebral hemispheres, consistent with chronic small vessel  ischemic disease, mild in nature. No evidence for acute or subacute infarct. No areas of chronic cortical infarction. No acute or chronic intracranial blood products. No mass lesion, midline shift or mass effect. Diffuse ventricular prominence related global parenchymal volume loss without hydrocephalus. No extra-axial fluid collection. Pituitary gland within normal limits. No abnormal enhancement. Vascular: Major intracranial vascular flow voids are maintained. Skull and upper cervical spine: Craniocervical junction within normal limits. Bone marrow signal intensity normal. No scalp soft tissue abnormality. Sinuses/Orbits: Prior bilateral ocular lens replacement. Paranasal sinuses and mastoid air cells are largely clear. Other: None. IMPRESSION: 1. No acute intracranial abnormality. 2. 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 Electronically Signed   By: Morene Hoard M.D.   On: 11/30/2023 01:30  DG Chest Port 1 View Result Date: 11/29/2023 CLINICAL DATA:  Altered mental status, unintentional weight loss EXAM: PORTABLE CHEST 1 VIEW COMPARISON:  12/04/2018 FINDINGS: Discoid atelectasis or scarring within the right mid lung zone. Lungs are otherwise clear. No pneumothorax or pleural effusion. Cardiac size is within normal limits. Pulmonary vascularity is normal. No acute bone abnormality. No unexpected retained intrathoracic metallic foreign body. IMPRESSION: No active disease. No unexpected retained metallic intrathoracic foreign body. Electronically Signed   By: Dorethia Molt M.D.   On: 11/29/2023 22:45   CT ABDOMEN PELVIS W CONTRAST Result Date: 11/29/2023 CLINICAL DATA:  Abdominal pain, acute, nonlocalized n/v, weight los, abd pain EXAM: CT ABDOMEN AND PELVIS WITH CONTRAST TECHNIQUE: Multidetector CT imaging of the abdomen and pelvis was performed using the standard protocol following bolus administration of intravenous contrast. RADIATION DOSE REDUCTION: This exam  was performed according to the departmental dose-optimization program which includes automated exposure control, adjustment of the mA and/or kV according to patient size and/or use of iterative reconstruction technique. CONTRAST:  OMNIPAQUE  IOHEXOL  300 MG/ML  SOLN COMPARISON:  CT abdomen/pelvis dated 01/11/2017. FINDINGS: Lower chest: No acute abnormality. Hepatobiliary: No focal liver abnormality is seen. No gallstones, gallbladder wall thickening, or biliary dilatation. Pancreas: Unremarkable. No pancreatic ductal dilatation or surrounding inflammatory changes. Spleen: Normal in size.  Punctate calcified granulomas. Adrenals/Urinary Tract: Adrenal glands are unremarkable. No urolithiasis or hydronephrosis. Bladder is unremarkable. Stomach/Bowel: Stomach is under distended and otherwise grossly unremarkable. Curvilinear radiodense structure traversing the lumen of a known moderate duodenal diverticulum is new since the prior exam (series 2, images 35-37 and series 5, images 37-39). No obstruction or focal inflammatory changes. Appendix appears normal. Descending and sigmoid colonic diverticulosis without evidence of acute diverticulitis. Vascular/Lymphatic: Abdominal aorta is normal in caliber with atherosclerotic calcification. No enlarged abdominal or pelvic lymph nodes. Reproductive: Prostate is unremarkable. Other: No abdominal wall hernia or abnormality. No abdominopelvic ascites. No intraperitoneal free air. Musculoskeletal: Levocurvature of the mid lumbar spine. Moderate to advanced degenerative changes of the thoracolumbar spine, most pronounced at L3-S1. No suspicious osseous lesion. IMPRESSION: 1. Curvilinear radiodense structure traversing the lumen of a known moderate duodenal diverticulum is new since the prior exam. This could relate to prior intervention, however, foreign body cannot be excluded. Clinical correlation is recommended. No evidence of obstruction or focal inflammatory changes. 2.  Descending and sigmoid colonic diverticulosis without evidence of acute diverticulitis. 3.  Aortic Atherosclerosis (ICD10-I70.0). Electronically Signed   By: Harrietta Sherry M.D.   On: 11/29/2023 15:21   CT Head Wo Contrast Result Date: 11/29/2023 EXAM: CT HEAD WITHOUT 11/29/2023 12:15:00 PM TECHNIQUE: CT of the head was performed without the administration of intravenous contrast. Automated exposure control, iterative reconstruction, and/or weight based adjustment of the mA/kV was utilized to reduce the radiation dose to as low as reasonably achievable. COMPARISON: CT head 11/06/2023, MRI brain 07/11/2020 CLINICAL HISTORY: AMS. Pt comes with c/o irregular HR. Pt HR 80-110. Pt denies any CP or SOB. Pt states difficulty sleeping, nausea and weight loss. Pt states V/D. Wife also reports some confusion. FINDINGS: BRAIN AND VENTRICLES: No acute intracranial hemorrhage. No mass effect or midline shift. No extra-axial fluid collection. Gray-white differentiation is maintained. No hydrocephalus. Generalized volume loss, mildly advanced for age but unchanged. 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 ORBITS: No acute abnormality. SINUSES AND MASTOIDS: No acute abnormality. SOFT TISSUES AND SKULL: No acute skull fracture. No acute soft tissue abnormality. IMPRESSION: 1. No acute intracranial abnormality. 2. Generalized volume  loss, mildly advanced for age but unchanged. 3. 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 Electronically signed by: Ryan Chess MD 11/29/2023 12:39 PM EDT RP Workstation: [id number]    Microbiology: Results for orders placed or performed in visit on 07/14/20  Microscopic Examination     Status: None   Collection Time: 07/14/20  1:22 PM   Urine  Result Value Ref Range Status   WBC, UA 0-5 0 - 5 /hpf Final   RBC, Urine 0-2 0 - 2 /hpf Final   Epithelial Cells (non renal) None seen 0 - 10 /hpf Final   Bacteria, UA None seen None seen/Few Final   *Note: Due to  a large number of results and/or encounters for the requested time period, some results have not been displayed. A complete set of results can be found in Results Review.    Labs: CBC: No results for input(s): WBC, NEUTROABS, HGB, HCT, MCV, PLT in the last 168 hours.  Basic Metabolic Panel: No results for input(s): NA, K, CL, CO2, GLUCOSE, BUN, CREATININE, CALCIUM , MG, PHOS in the last 168 hours.  Liver Function Tests: No results for input(s): AST, ALT, ALKPHOS, BILITOT, PROT, ALBUMIN in the last 168 hours.  CBG: No results for input(s): GLUCAP in the last 168 hours.   Discharge time spent: greater than 30 minutes.  Signed: Burnard DELENA Cunning, DO [facility] 12/11/2023

## 2023-11-30 NOTE — ED Notes (Signed)
 This NT assisted pt in walking to and from the restroom. Pt is now resting comfortably in bed, eating breakfast.

## 2023-11-30 NOTE — Progress Notes (Unsigned)
 No show

## 2023-12-02 ENCOUNTER — Telehealth: Payer: Self-pay

## 2023-12-02 NOTE — Transitions of Care (Post Inpatient/ED Visit) (Unsigned)
 12/02/2023  Name: James Hood MRN: [id number] DOB: 03/18/1941  Today's TOC FU Call Status: Today's TOC FU Call Status:: Successful TOC FU Call Completed TOC FU Call Complete Date: 12/02/23 Patient's Name and Date of Birth confirmed.  Transition Care Management Follow-up Telephone Call Date of Discharge: 11/30/23 Discharge Facility: [facility]) Type of Discharge: Inpatient Admission Primary Inpatient Discharge Diagnosis:: dehydration How have you been since you were released from the hospital?: Better Any questions or concerns?: No  Items Reviewed: Did you receive and understand the discharge instructions provided?: Yes Medications obtained,verified, and reconciled?: Yes (Medications Reviewed) Any new allergies since your discharge?: No Dietary orders reviewed?: Yes Do you have support at home?: Yes People in Home [RPT]: spouse  Medications Reviewed Today: Medications Reviewed Today     Reviewed by Emmitt Pan, LPN (Licensed Practical Nurse) on 12/02/23 at 1625  Med List Status: <None>   Medication Order Taking? Sig Documenting Provider Last Dose Status Informant  aspirin  EC 81 MG tablet [id number] Yes Take 81 mg by mouth daily. [provider]  Active            Med Note WENDELYN, SUSAN   Tue Apr 15, 2018  8:47 AM)    buPROPion  (WELLBUTRIN  XL) 150 MG 24 hr tablet [id number] Yes Take 1 tablet (150 mg total) by mouth daily. Gretel App, NP  Active   cholestyramine ORMA) 4 g packet [id number] Yes Take 1 packet by mouth 2 (two) times daily. [provider]  Active   Continuous Glucose Receiver (FREESTYLE LIBRE 2 READER) [place] [id number] Yes Use to check glucose at least every 8 hours Maribeth Camellia MATSU, MD  Active   Continuous Glucose Sensor (FREESTYLE LIBRE 2 [place]) [place] [id number] Yes Apply every 14 days to check glucose Maribeth Camellia MATSU, MD  Active   cyclobenzaprine  (FLEXERIL ) 10 MG tablet [id number] Yes Take 1 tablet  (10 mg total) by mouth 3 (three) times daily as needed for muscle spasms. Gretel App, NP  Active   dapagliflozin  propanediol (FARXIGA ) 10 MG TABS tablet [id number] Yes Take 10 mg by mouth daily. [provider]  Active Self  diphenoxylate -atropine  (LOMOTIL ) 2.5-0.025 MG tablet [id number] Yes Take by mouth. [provider]  Active            Med Note BROADUS, DENA L   Thu Dec 20, 2022 11:27 AM)    divalproex  (DEPAKOTE  ER) 500 MG 24 hr tablet [id number] Yes Take 1 tablet by mouth 2 (two) times daily. [provider]  Active   donepezil  (ARICEPT ) 10 MG tablet [id number] Yes Take 10 mg by mouth at bedtime. [provider]  Active Self  DULoxetine  (CYMBALTA ) 30 MG capsule [id number] Yes Take 2 capsules (60 mg total) by mouth daily. Gretel App, NP  Active   fenofibrate  (TRICOR ) 145 MG tablet [id number] Yes TAKE 1 TABLET BY MOUTH DAILY Maribeth Camellia MATSU, MD  Active   gabapentin  (NEURONTIN ) 600 MG tablet [id number] Yes TAKE TWO TABLETS BY MOUTH THREE TIMES A DAY Maribeth Camellia MATSU, MD  Active   lidocaine  (LIDODERM ) 5 % [id number] Yes Place 1 patch onto the skin every 12 (twelve) hours. Remove & Discard patch within 12 hours or as directed by MD Alvia Selinda PARAS, MD  Active            Med Note BEVERLEE, DESHANNON L   Tue Apr 02, 2023 10:15 AM)    linagliptin  (TRADJENTA ) 5 MG TABS tablet [id number] Yes Take 1 tablet (5 mg  total) by mouth daily. Gretel App, NP  Active   lisinopril -hydrochlorothiazide  (ZESTORETIC ) 20-25 MG tablet [id number] Yes Take 1 tablet by mouth 2 (two) times daily. Gretel App, NP  Active   memantine  (NAMENDA ) 10 MG tablet [id number] Yes Take 1 tablet by mouth daily. [provider]  Active   metFORMIN  (GLUCOPHAGE -XR) 500 MG 24 hr tablet [id number] Yes TAKE 1 TABLET BY MOUTH 2 TIMES A DAY Gretel App, NP  Active   metoprolol  succinate (TOPROL -XL) 25 MG 24 hr tablet [id number] Yes Take 0.5 tablets (12.5 mg total) by mouth daily. Gretel App, NP   Active   Multiple Vitamins-Minerals (CENTRUM SILVER PO) [id number] Yes Take 1 tablet by mouth daily. [provider]  Active   nystatin  (MYCOSTATIN ) 100000 UNIT/ML suspension [id number] Yes Take 5 mLs (500,000 Units total) by mouth 4 (four) times daily. Swish in the mouth and retain for as long as possible (several minutes) before swallowing. Use for 7-14 days. Gretel App, NP  Active   omega-3 acid ethyl esters (LOVAZA ) 1 g capsule [id number] Yes TAKE 2 CAPSULES BY MOUTH TWICE A DAY Maribeth Camellia MATSU, MD  Active   omeprazole  (PRILOSEC ) 20 MG capsule [id number] Yes Take 20 mg by mouth daily. [provider]  Active Self           Med Note STEFANI, KIMBERLY   Thu Aug 09, 2016 12:05 PM)    oxyCODONE -acetaminophen  (PERCOCET) 10-325 MG tablet [id number] Yes Take 1 tablet by mouth every 6 (six) hours as needed for pain. Patel, Seema K, NP  Active   oxyCODONE -acetaminophen  (PERCOCET) 10-325 MG tablet [id number] Yes Take 1 tablet by mouth every 6 (six) hours as needed for pain. Patel, Seema K, NP  Active   pantoprazole  (PROTONIX ) 40 MG tablet [id number] Yes Take 40 mg by mouth daily. [provider]  Active   pramipexole  (MIRAPEX ) 0.125 MG tablet [id number] Yes TAKE 1 TABLET BY MOUTH EVERY NIGHT 2-3 HOURS BEFORE BEDTIME Maribeth Camellia MATSU, MD  Active   primidone  (MYSOLINE ) 50 MG tablet [id number] Yes Take 150-250 mg by mouth 2 (two) times daily. Take 250 mg by mouth in the morning & take 150 mg at night. [provider]  Active Self  QUEtiapine  (SEROQUEL ) 25 MG tablet [id number] Yes Take 75 mg by mouth at bedtime. [provider]  Active Pharmacy Records  rosuvastatin  (CRESTOR ) 40 MG tablet [id number] Yes Take 1 tablet (40 mg total) by mouth daily. Gretel App, NP  Active   tamsulosin  (FLOMAX ) 0.4 MG CAPS capsule [id number] Yes TAKE 2 CAPSULES BY MOUTH DAILY AFTER SUPPER Maribeth Camellia MATSU, MD  Active   traZODone  (DESYREL ) 100 MG tablet [id number] Yes Take 2 tablets (200  mg total) by mouth at bedtime as needed for sleep. Hisada, Reina, MD  Active   Trospium  Chloride 60 MG CP24 [id number] Yes Take 1 capsule (60 mg total) by mouth daily. Francisca Redell BROCKS, MD  Active   vitamin C  (ASCORBIC ACID ) 500 MG tablet [id number] Yes Take 1,000 mg by mouth daily. [provider]  Active   Med List Note Marie Nathanel PARAS, RN 09/24/23 [id number]): MR 01/01/24 UDS 12-20-22            Home Care and Equipment/Supplies: Were Home Health Services Ordered?: NA Any new equipment or medical supplies ordered?: NA  Functional Questionnaire: Do you need assistance with bathing/showering or dressing?: No Do you need assistance with meal preparation?: No Do you need assistance with eating?: No Do you have difficulty maintaining continence: No  Do you need assistance with getting out of bed/getting out of a chair/moving?: No Do you have difficulty managing or taking your medications?: No  Follow up appointments reviewed: PCP Follow-up appointment confirmed?: Yes Date of PCP follow-up appointment?: 12/03/23 Follow-up Provider: [facility] Follow-up appointment confirmed?: NA Do you need transportation to your follow-up appointment?: No Do you understand care options if your condition(s) worsen?: Yes-patient verbalized understanding    SIGNATURE Julian Lemmings, LPN [facility] Nurse Health Advisor Direct Dial [phone]

## 2023-12-03 ENCOUNTER — Ambulatory Visit (INDEPENDENT_AMBULATORY_CARE_PROVIDER_SITE_OTHER): Admitting: Psychiatry

## 2023-12-03 ENCOUNTER — Ambulatory Visit: Admitting: Nurse Practitioner

## 2023-12-03 DIAGNOSIS — E1142 Type 2 diabetes mellitus with diabetic polyneuropathy: Secondary | ICD-10-CM

## 2023-12-03 DIAGNOSIS — Z91199 Patient's noncompliance with other medical treatment and regimen due to unspecified reason: Secondary | ICD-10-CM

## 2023-12-04 ENCOUNTER — Ambulatory Visit
Attending: Student in an Organized Health Care Education/Training Program | Admitting: Student in an Organized Health Care Education/Training Program

## 2023-12-04 ENCOUNTER — Encounter: Payer: Self-pay | Admitting: Student in an Organized Health Care Education/Training Program

## 2023-12-04 VITALS — BP 125/72 | HR 88 | Temp 97.1°F | Resp 16 | Ht 71.0 in | Wt 162.9 lb

## 2023-12-04 DIAGNOSIS — G8929 Other chronic pain: Secondary | ICD-10-CM | POA: Insufficient documentation

## 2023-12-04 DIAGNOSIS — M792 Neuralgia and neuritis, unspecified: Secondary | ICD-10-CM | POA: Insufficient documentation

## 2023-12-04 DIAGNOSIS — M5386 Other specified dorsopathies, lumbar region: Secondary | ICD-10-CM | POA: Insufficient documentation

## 2023-12-04 DIAGNOSIS — M25572 Pain in left ankle and joints of left foot: Secondary | ICD-10-CM | POA: Diagnosis present

## 2023-12-04 NOTE — Progress Notes (Signed)
 Safety precautions to be maintained throughout the outpatient stay will include: orient to surroundings, keep bed in low position, maintain call bell within reach at all times, provide assistance with transfer out of bed and ambulation.

## 2023-12-04 NOTE — Progress Notes (Signed)
 PROVIDER NOTE: Interpretation of information contained herein should be left to medically-trained personnel. Specific patient instructions are provided elsewhere under Patient Instructions section of medical record. This document was created in part using AI and STT-dictation technology, any transcriptional errors that may result from this process are unintentional.  Patient: James Hood  Service: E/M   PCP: Gretel App, NP  DOB: Nov 04, 1940  DOS: 12/04/2023  Provider: Wallie Sherry, MD  MRN: [id number]  Delivery: Face-to-face  Specialty: Interventional Pain Management  Type: Established Patient  Setting: Ambulatory outpatient facility  Specialty designation: 09  Referring Prov.: Gretel App, NP  Location: Outpatient office facility       History of present illness (HPI) James Hood, a 83 y.o. year old male, is here today because of his Sciatica of left side associated with disorder of lumbar spine [M53.86]. James Hood's primary complain today is Ankle Pain (left)  Pertinent problems: James Hood has Primary localized osteoarthritis of right knee; DJD (degenerative joint disease) of knee; Lumbar herniated disc; Chronic pain syndrome; Failed back surgical syndrome; Lumbosacral radiculopathy; Primary osteoarthritis of left ankle; Lumbar facet joint syndrome; Neuropathy; Chronic pain of left ankle; and Osteochondral defect of ankle on their pertinent problem list.  Pain Assessment: Severity of Chronic pain is reported as a 0-No pain/10. Location: Ankle Left/denies. Onset: More than a month ago. Quality:  . Timing: Other (Comment) (no pain since PNS). Modifying factor(s): PNS. Vitals:  height is 5' 11 (1.803 m) and weight is 162 lb 14.4 oz (73.9 kg). His temperature is 97.1 F (36.2 C) (abnormal). His blood pressure is 168/83 (abnormal) and his pulse is 88. His respiration is 18 and oxygen saturation is 99%.  BMI: Estimated body mass index is 22.72 kg/m as calculated from the  following:   Height as of this encounter: 5' 11 (1.803 m).   Weight as of this encounter: 162 lb 14.4 oz (73.9 kg).  Last encounter: 08/21/2023. Last procedure: 10/16/2023.  Reason for encounter:   Follow-up status post removal of Sprint peripheral nerve stimulator lead for the left popliteal sciatic nerve for left ankle pain.  Patient states that his lead was removed over the weekend during inpatient hospital admission.  He notes 100% pain relief of his left ankle with Sprint peripheral nerve stimulation.  Pharmacotherapy Assessment   Chronic Opioid Analgesic:  Oxycodone  10 mg every 6 hours as needed, quantity 120/month; MME equals 60   Monitoring: [place] PMP: PDMP not reviewed this encounter.       Pharmacotherapy: No side-effects or adverse reactions reported. Compliance: No problems identified. Effectiveness: Clinically acceptable.  Dayna Pulling, RN  12/04/2023 11:05 AM  Sign when Signing Visit Safety precautions to be maintained throughout the outpatient stay will include: orient to surroundings, keep bed in low position, maintain call bell within reach at all times, provide assistance with transfer out of bed and ambulation.   UDS:  Summary  Date Value Ref Range Status  12/20/2022 Note  Final    Comment:    ==================================================================== [employer] [facility] (MW) ==================================================================== Test                             Result       Flag       Units  Drug Present and Declared for Prescription Verification   Oxymorphone                    1238  EXPECTED   ng/mg creat   Noroxycodone                   2046         EXPECTED   ng/mg creat   Noroxymorphone                 575          EXPECTED   ng/mg creat    Oxymorphone, noroxycodone and noroxymorphone are expected    metabolites of oxycodone . Noroxymorphone is an expected metabolite    of oxymorphone. Sources of oxycodone  and/or oxymorphone  include    scheduled prescription medications.    Phenobarbital                  PRESENT      EXPECTED    Phenobarbital is an expected metabolite of primidone ; Phenobarbital    may also be administered as a prescription drug.  Drug Absent but Declared for Prescription Verification   Oxycodone                       Not Detected UNEXPECTED ng/mg creat    Oxycodone  is almost always present in patients taking this drug    consistently.  Absence of oxycodone  could be due to lapse of time    since the last dose or unusual pharmacokinetics (rapid metabolism).  ==================================================================== Test                      Result    Flag   Units      Ref Range   Creatinine              24               mg/dL      >=79 ==================================================================== Declared Medications:  The flagging and interpretation on this report are based on the  following declared medications.  Unexpected results may arise from  inaccuracies in the declared medications.   **Note: The testing scope of this panel includes these medications:   Oxycodone  (Percocet)  Primidone  (Mysoline )   **Note: The testing scope of this panel does not include the  following reported medications:   Acetaminophen  (Percocet)  Aspirin   Atropine  (Lomotil )  Bupropion  (Wellbutrin  XL)  Cholestyramine (Questran)  Ciclopirox   Dapagliflozin  (Farxiga )  Diphenoxylate  (Lomotil )  Divaleproex (Depakote )  Donepezil  (Aricept )  Duloxetine  (Cymbalta )  Empagliflozin  (Jardiance )  Fenofibrate  (TriCor )  Gabapentin  (Neurontin )  Hydrochlorothiazide  (Zestoretic )  Linagliptin  (Tradjenta )  Lisinopril  (Zestoretic )  Memantine  (Namenda )  Metformin  (Glucophage )  Metoprolol  (Toprol )  Multivitamin  Omega-3 Fatty Acids  Omeprazole  (Prilosec )  Pantoprazole  (Protonix )  Pramipexole  (Mirapex )  Quetiapine  (Seroquel )  Rosuvastatin  (Crestor )  Supplement  Tamsulosin  (Flomax )  Topical  Lidocaine  (Lidoderm )  Trazodone  (Desyrel )  Vitamin C  ==================================================================== For clinical consultation, please call [phone]. ====================================================================     No results found for: CBDTHCR No results found for: D8THCCBX No results found for: D9THCCBX  ROS  Constitutional: Denies any fever or chills Gastrointestinal: No reported hemesis, hematochezia, vomiting, or acute GI distress Musculoskeletal: Denies any acute onset joint swelling, redness, loss of ROM, or weakness Neurological: No reported episodes of acute onset apraxia, aphasia, dysarthria, agnosia, amnesia, paralysis, loss of coordination, or loss of consciousness  Medication Review  DULoxetine , FreeStyle Libre 2 Reader, FreeStyle Libre 2 Sensor, Multiple Vitamins-Minerals, QUEtiapine , Trospium  Chloride, ascorbic acid , aspirin  EC, buPROPion , cholestyramine, cyclobenzaprine , dapagliflozin  propanediol, diphenoxylate -atropine , divalproex , donepezil , fenofibrate , gabapentin , lidocaine , linagliptin , lisinopril -hydrochlorothiazide , memantine , metFORMIN , metoprolol  succinate, nystatin ,  omega-3 acid ethyl esters, omeprazole , oxyCODONE -acetaminophen , pantoprazole , pramipexole , primidone , rosuvastatin , tamsulosin , and traZODone   History Review  Allergy: Mr. Cervi has no known allergies. Drug: Mr. Saine  reports no history of drug use. Alcohol :  reports that he does not currently use alcohol . Tobacco:  reports that he has been smoking cigarettes. He started smoking about 68 years ago. He has a 34.3 pack-year smoking history. He has been exposed to tobacco smoke. He has never used smokeless tobacco. Social: Mr. Buch  reports that he has been smoking cigarettes. He started smoking about 68 years ago. He has a 34.3 pack-year smoking history. He has been exposed to tobacco smoke. He has never used smokeless tobacco. He reports that he does  not currently use alcohol . He reports that he does not use drugs. 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 Surgical: Mr. 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 Family: family history includes Depression in his father; Heart attack in his mother; Heart disease in his mother; Hypertension in his father.  Laboratory Chemistry Profile   Renal Lab Results  Component Value Date   BUN 42 (H) 11/30/2023   CREATININE 1.20 11/30/2023   BCR 23 10/08/2022   GFR 46.03 (L) 09/30/2023   GFRAA >60 09/25/2019   GFRNONAA >60 11/30/2023    Hepatic Lab Results  Component Value Date   AST 47 (H)  11/29/2023   ALT 30 11/29/2023   ALBUMIN 3.9 11/29/2023   ALKPHOS 33 (L) 11/29/2023   AMMONIA 14 08/09/2016    Electrolytes Lab Results  Component Value Date   NA 136 11/30/2023   K 3.8 11/30/2023   CL 98 11/30/2023   CALCIUM  9.3 11/30/2023   MG 1.4 (L) 09/08/2020    Bone Lab Results  Component Value Date   VD25OH 37.09 11/10/2021    Inflammation (CRP: Acute Phase) (ESR: Chronic Phase) Lab Results  Component Value Date   ESRSEDRATE 15 07/20/2019   LATICACIDVEN 1.9 06/08/2020         Note: Above Lab results reviewed.  Recent Imaging Review  MR BRAIN W WO CONTRAST CLINICAL DATA:  Initial evaluation for acute mental status change, weight loss.  EXAM: MRI HEAD WITHOUT AND WITH CONTRAST  TECHNIQUE: Multiplanar, multiecho pulse sequences of the brain and surrounding structures were obtained without and with intravenous contrast.  CONTRAST:  7mL GADAVIST  GADOBUTROL  1 MMOL/ML IV SOLN  COMPARISON:  CT from 11/29/2023  FINDINGS: Brain: Diffuse prominence of the CSF containing spaces compatible with generalized cerebral atrophy, fairly advanced in nature. Patchy T2/FLAIR hyperintensity involving the periventricular deep white matter both cerebral hemispheres, consistent with chronic small vessel ischemic  disease, mild in nature.  No evidence for acute or subacute infarct. No areas of chronic cortical infarction. No acute or chronic intracranial blood products.  No mass lesion, midline shift or mass effect. Diffuse ventricular prominence related global parenchymal volume loss without hydrocephalus. No extra-axial fluid collection. Pituitary gland within normal limits.  No abnormal enhancement.  Vascular: Major intracranial vascular flow voids are maintained.  Skull and upper cervical spine: Craniocervical junction within normal limits. Bone marrow signal intensity normal. No scalp soft tissue abnormality.  Sinuses/Orbits: Prior bilateral ocular lens  replacement. Paranasal sinuses and mastoid air cells are largely clear.  Other: None.  IMPRESSION: 1. No acute intracranial abnormality. 2. 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  Electronically Signed   By: Morene Hoard M.D.   On: 11/30/2023 01:30 Note: Reviewed        Physical Exam  Vitals: BP (!) 168/83   Pulse 88   Temp (!) 97.1 F (36.2 C)   Resp 18   Ht 5' 11 (1.803 m)   Wt 162 lb 14.4 oz (73.9 kg)   SpO2 99%   BMI 22.72 kg/m  BMI: Estimated body mass index is 22.72 kg/m as calculated from the following:   Height as of this encounter: 5' 11 (1.803 m).   Weight as of this encounter: 162 lb 14.4 oz (73.9 kg). Ideal: Ideal body weight: 75.3 kg (166 lb 0.1 oz) General appearance: Well nourished, well developed, and well hydrated. In no apparent acute distress Mental status: Alert, oriented x 3 (person, place, & time)       Respiratory: No evidence of acute respiratory distress Eyes: PERLA   Assessment   Diagnosis Status  1. Sciatica of left side associated with disorder of lumbar spine   2. Neuropathic pain of left ankle   3. Chronic pain of left ankle    Controlled Controlled Controlled   Updated Problems: No problems updated.  Plan of Care  The patient had his Sprint peripheral nerve stimulator system targeting the popliteal sciatic nerve removed. The external component and the lead were both removed during his recent inpatient admission over the weekend. There were no complications reported during the removal process.  I was able to examine his insertion site neck and confirmed that there is no leak present.  He presents today for follow-up and reports being pain-free, expressing that he is very pleased with the results of the trial.  We discussed next steps, which include continued monitoring of his symptoms to assess for durability of benefit. No additional interventions are planned at this time. The patient  will follow up as needed if pain recurs or if further treatment options are needed.  Continue with medication management with Seema.    Return in about 4 weeks (around 01/01/2024), or F2F end of Auguest please.SABRA    Recent Visits Date Type Provider Dept  10/16/23 Procedure visit Marcelino Nurse, MD [facility]  09/24/23 Office Visit Patel, Seema K, NP [facility]  Showing recent visits within past 90 days and meeting all other requirements Today's Visits Date Type Provider Dept  12/04/23 Procedure visit Marcelino Nurse, MD [facility]  Showing today's visits and meeting all other requirements Future Appointments Date Type Provider Dept  12/25/23 Appointment Patel, Seema K, NP [facility]  Showing future appointments within next 90 days and meeting all other requirements  I discussed the assessment and treatment plan with the patient. The patient was provided an opportunity to  ask questions and all were answered. The patient agreed with the plan and demonstrated an understanding of the instructions.  Patient advised to call back or seek an in-person evaluation if the symptoms or condition worsens.  Duration of encounter: .  Total time on encounter, as per AMA guidelines included both the face-to-face and non-face-to-face time personally spent by the physician and/or other qualified health care professional(s) on the day of the encounter (includes time in activities that require the physician or other qualified health care professional and does not include time in activities normally performed by clinical staff). Physician's time may include the following activities when performed: Preparing to see the patient (e.g., pre-charting review of records, searching for previously ordered imaging, lab work, and nerve conduction tests) Review of prior analgesic pharmacotherapies. Reviewing PMP Interpreting ordered tests (e.g., lab work, imaging, nerve  conduction tests) Performing post-procedure evaluations, including interpretation of diagnostic procedures Obtaining and/or reviewing separately obtained history Performing a medically appropriate examination and/or evaluation Counseling and educating the patient/family/caregiver Ordering medications, tests, or procedures Referring and communicating with other health care professionals (when not separately reported) Documenting clinical information in the electronic or other health record Independently interpreting results (not separately reported) and communicating results to the patient/ family/caregiver Care coordination (not separately reported)  Note by: Wallie Sherry, MD (TTS and AI technology used. I apologize for any typographical errors that were not detected and corrected.) Date: 12/04/2023; Time: 11:52 AM

## 2023-12-04 NOTE — Patient Instructions (Signed)

## 2023-12-10 ENCOUNTER — Other Ambulatory Visit: Payer: Self-pay

## 2023-12-10 DIAGNOSIS — E782 Mixed hyperlipidemia: Secondary | ICD-10-CM

## 2023-12-10 MED ORDER — FENOFIBRATE 145 MG PO TABS: 145.0000 mg | ORAL_TABLET | Freq: Every day | ORAL | 3 refills | Status: AC

## 2023-12-11 ENCOUNTER — Encounter: Payer: Self-pay | Admitting: Internal Medicine

## 2023-12-11 ENCOUNTER — Ambulatory Visit: Admitting: Student in an Organized Health Care Education/Training Program

## 2023-12-24 ENCOUNTER — Ambulatory Visit: Admitting: Nurse Practitioner

## 2023-12-25 ENCOUNTER — Ambulatory Visit (HOSPITAL_BASED_OUTPATIENT_CLINIC_OR_DEPARTMENT_OTHER): Admitting: Nurse Practitioner

## 2023-12-25 DIAGNOSIS — Z91199 Patient's noncompliance with other medical treatment and regimen due to unspecified reason: Secondary | ICD-10-CM

## 2023-12-25 DIAGNOSIS — G894 Chronic pain syndrome: Secondary | ICD-10-CM

## 2023-12-25 NOTE — Progress Notes (Signed)
 12/25/2023-No show

## 2023-12-26 ENCOUNTER — Other Ambulatory Visit: Payer: Self-pay | Admitting: Nurse Practitioner

## 2023-12-26 ENCOUNTER — Other Ambulatory Visit: Payer: Self-pay

## 2023-12-26 DIAGNOSIS — G8929 Other chronic pain: Secondary | ICD-10-CM

## 2023-12-26 DIAGNOSIS — F32A Depression, unspecified: Secondary | ICD-10-CM

## 2023-12-26 DIAGNOSIS — E1142 Type 2 diabetes mellitus with diabetic polyneuropathy: Secondary | ICD-10-CM

## 2023-12-26 DIAGNOSIS — M19072 Primary osteoarthritis, left ankle and foot: Secondary | ICD-10-CM

## 2023-12-26 MED ORDER — METFORMIN HCL ER 500 MG PO TB24: 500.0000 mg | ORAL_TABLET | Freq: Two times a day (BID) | ORAL | 1 refills | Status: DC

## 2024-01-01 ENCOUNTER — Ambulatory Visit (INDEPENDENT_AMBULATORY_CARE_PROVIDER_SITE_OTHER): Payer: PPO | Admitting: *Deleted

## 2024-01-01 ENCOUNTER — Observation Stay
Admission: EM | Admit: 2024-01-01 | Discharge: 2024-01-02 | Disposition: A | Discharge status: Home / Self Care | Source: Other Acute Inpatient Hospital | Attending: Internal Medicine | Admitting: Internal Medicine

## 2024-01-01 ENCOUNTER — Encounter: Payer: Self-pay | Admitting: Nurse Practitioner

## 2024-01-01 ENCOUNTER — Other Ambulatory Visit: Payer: Self-pay

## 2024-01-01 ENCOUNTER — Ambulatory Visit: Admitting: Nurse Practitioner

## 2024-01-01 ENCOUNTER — Emergency Department

## 2024-01-01 VITALS — BP 132/84 | HR 81 | Temp 97.7°F | Ht 70.0 in | Wt 172.6 lb

## 2024-01-01 VITALS — Ht 70.0 in | Wt 165.0 lb

## 2024-01-01 DIAGNOSIS — G2581 Restless legs syndrome: Secondary | ICD-10-CM | POA: Diagnosis not present

## 2024-01-01 DIAGNOSIS — R296 Repeated falls: Secondary | ICD-10-CM | POA: Diagnosis present

## 2024-01-01 DIAGNOSIS — R9431 Abnormal electrocardiogram [ECG] [EKG]: Secondary | ICD-10-CM | POA: Diagnosis not present

## 2024-01-01 DIAGNOSIS — F17210 Nicotine dependence, cigarettes, uncomplicated: Secondary | ICD-10-CM | POA: Diagnosis not present

## 2024-01-01 DIAGNOSIS — R531 Weakness: Principal | ICD-10-CM

## 2024-01-01 DIAGNOSIS — R001 Bradycardia, unspecified: Secondary | ICD-10-CM | POA: Diagnosis not present

## 2024-01-01 DIAGNOSIS — I441 Atrioventricular block, second degree: Secondary | ICD-10-CM | POA: Insufficient documentation

## 2024-01-01 DIAGNOSIS — F0390 Unspecified dementia without behavioral disturbance: Secondary | ICD-10-CM | POA: Insufficient documentation

## 2024-01-01 DIAGNOSIS — Z96651 Presence of right artificial knee joint: Secondary | ICD-10-CM | POA: Diagnosis not present

## 2024-01-01 DIAGNOSIS — Z7902 Long term (current) use of antithrombotics/antiplatelets: Secondary | ICD-10-CM | POA: Diagnosis not present

## 2024-01-01 DIAGNOSIS — Z0000 Encounter for general adult medical examination without abnormal findings: Secondary | ICD-10-CM | POA: Diagnosis not present

## 2024-01-01 DIAGNOSIS — G894 Chronic pain syndrome: Secondary | ICD-10-CM

## 2024-01-01 DIAGNOSIS — E785 Hyperlipidemia, unspecified: Secondary | ICD-10-CM | POA: Diagnosis not present

## 2024-01-01 DIAGNOSIS — Z720 Tobacco use: Secondary | ICD-10-CM | POA: Diagnosis present

## 2024-01-01 DIAGNOSIS — E1142 Type 2 diabetes mellitus with diabetic polyneuropathy: Secondary | ICD-10-CM

## 2024-01-01 DIAGNOSIS — M1711 Unilateral primary osteoarthritis, right knee: Secondary | ICD-10-CM | POA: Diagnosis present

## 2024-01-01 DIAGNOSIS — Z7982 Long term (current) use of aspirin: Secondary | ICD-10-CM | POA: Diagnosis not present

## 2024-01-01 DIAGNOSIS — N183 Chronic kidney disease, stage 3 unspecified: Secondary | ICD-10-CM

## 2024-01-01 DIAGNOSIS — M5417 Radiculopathy, lumbosacral region: Secondary | ICD-10-CM

## 2024-01-01 DIAGNOSIS — E119 Type 2 diabetes mellitus without complications: Secondary | ICD-10-CM

## 2024-01-01 DIAGNOSIS — I10 Essential (primary) hypertension: Secondary | ICD-10-CM | POA: Diagnosis present

## 2024-01-01 DIAGNOSIS — Z7984 Long term (current) use of oral hypoglycemic drugs: Secondary | ICD-10-CM | POA: Diagnosis not present

## 2024-01-01 LAB — CBC
HCT: 32.4 % — ABNORMAL LOW (ref 39.0–52.0)
HCT: 31.7 % — ABNORMAL LOW (ref 39.0–52.0)
Hemoglobin: 10.6 g/dL — ABNORMAL LOW (ref 13.0–17.0)
Hemoglobin: 10.5 g/dL — ABNORMAL LOW (ref 13.0–17.0)
MCH: 31.7 pg (ref 26.0–34.0)
MCH: 31.8 pg (ref 26.0–34.0)
MCHC: 32.7 g/dL (ref 30.0–36.0)
MCHC: 33.1 g/dL (ref 30.0–36.0)
MCV: 97 fL (ref 80.0–100.0)
MCV: 96.1 fL (ref 80.0–100.0)
Platelets: 258 K/uL (ref 150–400)
Platelets: 254 K/uL (ref 150–400)
RBC: 3.34 MIL/uL — ABNORMAL LOW (ref 4.22–5.81)
RBC: 3.3 MIL/uL — ABNORMAL LOW (ref 4.22–5.81)
RDW: 15.1 % (ref 11.5–15.5)
RDW: 14.9 % (ref 11.5–15.5)
WBC: 5.9 K/uL (ref 4.0–10.5)
WBC: 6.5 K/uL (ref 4.0–10.5)
nRBC: 0 % (ref 0.0–0.2)
nRBC: 0 % (ref 0.0–0.2)

## 2024-01-01 LAB — URINALYSIS, ROUTINE W REFLEX MICROSCOPIC
Bilirubin Urine: NEGATIVE
Glucose, UA: NEGATIVE mg/dL
Hgb urine dipstick: NEGATIVE
Ketones, ur: NEGATIVE mg/dL
Leukocytes,Ua: NEGATIVE
Nitrite: NEGATIVE
Protein, ur: NEGATIVE mg/dL
Specific Gravity, Urine: 1.01 (ref 1.005–1.030)
pH: 6 (ref 5.0–8.0)

## 2024-01-01 LAB — COMPREHENSIVE METABOLIC PANEL WITH GFR
ALT: 18 U/L (ref 0–44)
AST: 31 U/L (ref 15–41)
Albumin: 3.5 g/dL (ref 3.5–5.0)
Alkaline Phosphatase: 51 U/L (ref 38–126)
Anion gap: 11 (ref 5–15)
BUN: 28 mg/dL — ABNORMAL HIGH (ref 8–23)
CO2: 28 mmol/L (ref 22–32)
Calcium: 9.4 mg/dL (ref 8.9–10.3)
Chloride: 98 mmol/L (ref 98–111)
Creatinine, Ser: 1.44 mg/dL — ABNORMAL HIGH (ref 0.61–1.24)
GFR, Estimated: 48 mL/min — ABNORMAL LOW (ref >60)
Glucose, Bld: 99 mg/dL (ref 70–99)
Potassium: 4.5 mmol/L (ref 3.5–5.1)
Sodium: 137 mmol/L (ref 135–145)
Total Bilirubin: 0.6 mg/dL (ref 0.0–1.2)
Total Protein: 6.5 g/dL (ref 6.5–8.1)

## 2024-01-01 LAB — CREATININE, SERUM
Creatinine, Ser: 1.32 mg/dL — ABNORMAL HIGH (ref 0.61–1.24)
GFR, Estimated: 54 mL/min — ABNORMAL LOW (ref >60)

## 2024-01-01 LAB — FOLATE: Folate: 26 ng/mL (ref >5.9)

## 2024-01-01 LAB — MAGNESIUM: Magnesium: 1.6 mg/dL — ABNORMAL LOW (ref 1.7–2.4)

## 2024-01-01 LAB — VITAMIN B12: Vitamin B-12: 433 pg/mL (ref 180–914)

## 2024-01-01 LAB — TSH: TSH: 1.416 u[IU]/mL (ref 0.350–4.500)

## 2024-01-01 LAB — TROPONIN I (HIGH SENSITIVITY): Troponin I (High Sensitivity): 8 ng/L (ref <18)

## 2024-01-01 MED ORDER — ACETAMINOPHEN 325 MG PO TABS
650.0000 mg | ORAL_TABLET | Freq: Four times a day (QID) | ORAL | Status: DC | PRN
  Administered 2024-01-02: 650 mg via ORAL
  Filled 2024-01-01: qty 2

## 2024-01-01 MED ORDER — GABAPENTIN 300 MG PO CAPS
600.0000 mg | ORAL_CAPSULE | Freq: Once | ORAL | Status: AC
  Administered 2024-01-01 (×2): 600 mg via ORAL
  Filled 2024-01-01: qty 2

## 2024-01-01 MED ORDER — ENOXAPARIN SODIUM 40 MG/0.4ML IJ SOSY
40.0000 mg | PREFILLED_SYRINGE | INTRAMUSCULAR | Status: DC
  Administered 2024-01-01 (×2): 40 mg via SUBCUTANEOUS
  Filled 2024-01-01: qty 0.4

## 2024-01-01 MED ORDER — OXYCODONE-ACETAMINOPHEN 5-325 MG PO TABS
2.0000 | ORAL_TABLET | Freq: Once | ORAL | Status: AC
  Administered 2024-01-01 (×2): 2 via ORAL
  Filled 2024-01-01: qty 2

## 2024-01-01 MED ORDER — ACETAMINOPHEN 325 MG RE SUPP: 650.0000 mg | Freq: Four times a day (QID) | RECTAL | Status: DC | PRN

## 2024-01-01 MED ORDER — POLYETHYLENE GLYCOL 3350 17 G PO PACK: 17.0000 g | PACK | Freq: Every day | ORAL | Status: DC | PRN

## 2024-01-01 MED ORDER — SODIUM CHLORIDE 0.9 % IV BOLUS
500.0000 mL | Freq: Once | INTRAVENOUS | Status: AC
  Administered 2024-01-01 (×2): 500 mL via INTRAVENOUS

## 2024-01-01 MED ORDER — METOPROLOL SUCCINATE ER 25 MG PO TB24: 12.5000 mg | ORAL_TABLET | Freq: Every day | ORAL | Status: DC

## 2024-01-01 NOTE — Consult Note (Signed)
 [facility] CARDIOLOGY CONSULT NOTE       Patient ID: James Hood MRN: [id number] DOB/AGE: 1940/07/01 82 y.o.  Admit date: 01/01/2024 Referring Physician Dr. Jacolyn Primary Physician Gretel App, NP Primary Cardiologist None Reason for Consultation Abnormal EKG  HPI: James Hood is a 83 y.o. 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 Patient states he's had issues with recurrent falls for past 2-3 years and it's always associated with loss of balance. Arrived to ED as falls have been more frequent, fell 4x this week. Patient denies any chest pain, SOB, palpitations, syncope or lightheadedness. Reports intermittent dizziness when he lays down or gets up for the past week after he hit his head 1 week ago. EKG and telemetry revealed second degree AVB, Mobitz 1. Cardiology was consulted for further evaluation.   Work up in the ED notable for Na 137, K 4.5, Cr 1.44, GFR 48, Hgb 10.6, plts 258, WBC 5.9. TSH within normal limits. UA negative. EKG in ED with second degree AVB, mobitz 1 rate 78 bpm. Per tele demonstrates the same rhythm with rates 70-80s. No evidence of significant pauses or bradycardia. Trop negative x1. Head CT pending. Patient received IVF bolus.   At the time of my evaluation this afternoon, patient was had issues with recurrent falls for past 2-3 years and it's always associated with loss of balance. Arrived to ED as falls have been more frequent, fell 4x this week. Patient denies any chest pain, SOB, palpitations, syncope or lightheadedness.  Reports intermittent dizziness when he lays down or gets up for the past week that resolves quickly. Patient states this new dizziness came on after he hit his head 1 week ago. Patient states he never saw a provider after hitting his head or got imaging done.  Currently, patient states he feels well and denies any lightheadedness or dizziness. HR have remained stable in 70-80s, no evidence of bradycardia.    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   Colon polyps    Complication of anesthesia    Woke during first cataract procedure   Dementia (HCC)    Depression    Diabetes mellitus type 2 in nonobese (HCC)    GERD (gastroesophageal reflux disease)    BARRETTS ESOPHAGUS RESOLVED PER PATIENT   Headache    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HCC)    History of hiatal hernia    Hypercholesteremia    Hypertension    Hyperthyroidism    Intractable nausea and vomiting 11/29/2023   Intractable nausea and vomiting 11/29/2023   Nausea & vomiting 11/30/2023   Neuropathic pain    New onset headache 09/09/2015   Primary localized osteoarthritis of right knee    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   Stomach ulcer    Tachycardia 11/29/2023   Tremor, essential    Wears dentures    full upper   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/02/2018    Past Surgical History:  Procedure Laterality Date   ANKLE ARTHROSCOPY Left 09/29/2019   Procedure: LEFT ANKLE ARTHROSCOPY, DEBRIDEMENT;  Surgeon: Harden Lame  V, MD;  Location: [facility];  Service: Orthopedics;  Laterality: Left;   BACK SURGERY     BIOPSY  01/18/2023   Procedure: BIOPSY;  Surgeon: Onita Elspeth Sharper, DO;  Location: [facility] ENDOSCOPY;  Service: Gastroenterology;;   CATARACT EXTRACTION W/PHACO Right 03/12/2023   Procedure: CATARACT EXTRACTION PHACO AND INTRAOCULAR LENS PLACEMENT (IOC) RIGHT DIABETIC 11.25 01:07.6;  Surgeon: Jaye Fallow, MD;  Location: [facility];  Service: Ophthalmology;  Laterality: Right;   CATARACT  EXTRACTION W/PHACO Left 03/25/2023   Procedure: CATARACT EXTRACTION PHACO AND INTRAOCULAR LENS PLACEMENT (IOC) LEFT DIABETIC 7.15 00:51.0;  Surgeon: Jaye Fallow, MD;  Location: [facility];  Service: Ophthalmology;  Laterality: Left;   COLONOSCOPY WITH PROPOFOL  N/A 09/14/2016   Procedure: COLONOSCOPY WITH PROPOFOL ;  Surgeon: Lamar ONEIDA Holmes, MD;  Location: [facility] ENDOSCOPY;  Service: Endoscopy;  Laterality: N/A;   esophageal stretch     ESOPHAGOGASTRODUODENOSCOPY (EGD) WITH PROPOFOL  N/A 12/13/2020   Procedure: ESOPHAGOGASTRODUODENOSCOPY (EGD) WITH PROPOFOL ;  Surgeon: Maryruth Ole ONEIDA, MD;  Location: [facility] ENDOSCOPY;  Service: Endoscopy;  Laterality: N/A;  IDDM   ESOPHAGOGASTRODUODENOSCOPY (EGD) WITH PROPOFOL  N/A 01/18/2023   Procedure: ESOPHAGOGASTRODUODENOSCOPY (EGD) WITH PROPOFOL ;  Surgeon: Onita Elspeth Sharper, DO;  Location: [facility] ENDOSCOPY;  Service: Gastroenterology;  Laterality: N/A;   JOINT REPLACEMENT Right 2016   knee   KNEE SURGERY Left 10/2023   placed a stimulator in knee then was removed several months later   LUMBAR LAMINECTOMY/DECOMPRESSION MICRODISCECTOMY Left 02/03/2016   Procedure: LEFT L5-S1 DISKECTOMY;  Surgeon: Catalina Stains, MD;  Location: [facility]O [facility]S;  Service: Neurosurgery;  Laterality: Left;  LEFT L5-S1 DISKECTOMY   MALONEY DILATION  01/18/2023   Procedure: MALONEY DILATION;  Surgeon: Onita Elspeth Sharper, DO;  Location: [facility] ENDOSCOPY;  Service: Gastroenterology;;   PULSE GENERATOR IMPLANT N/A 08/21/2017   Procedure: UNILATERAL PULSE GENERATOR IMPLANT;  Surgeon: Clois Fret, MD;  Location: [facility] ORS;  Service: Neurosurgery;  Laterality: N/A;   PULSE GENERATOR IMPLANT Right 04/02/2018   Procedure: REMOVAL OF PULSE GENERATOR IMPLANT AND LEADS;  Surgeon: Clois Fret, MD;  Location: [facility] ORS;  Service: Neurosurgery;  Laterality: Right;   TONSILLECTOMY     TOTAL KNEE ARTHROPLASTY Right 11/15/2014   Procedure: TOTAL KNEE ARTHROPLASTY;   Surgeon: Lamar Millman, MD;  Location: [facility];  Service: Orthopedics;  Laterality: Right;    (Not in a hospital admission)  Social History   Socioeconomic History   Marital status: Married    Spouse name: Not on file   Number of children: Not on file   Years of education: Not on file   Highest education level: Master's degree (e.g., MA, MS, MEng, MEd, MSW, MBA)  Occupational History   Not on file  Tobacco Use   Smoking status: Every Day    Current packs/day: 0.50    Average packs/day: 0.5 packs/day for 68.6 years (34.3 ttl pk-yrs)    Types: Cigarettes    Start date: 74    Passive exposure: Current   Smokeless tobacco: Never  Vaping Use   Vaping status: Former  Substance and Sexual Activity   Alcohol  use: Not Currently    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   Drug use: Never   Sexual activity: Not Currently  Other Topics Concern   Not on file  Social History Narrative   married   Social Drivers of Health   Financial Resource Strain: Medium Risk (12/31/2023)   Overall Financial Resource Strain (CARDIA)    Difficulty of Paying Living Expenses: Somewhat hard  Food Insecurity: No Food Insecurity (12/31/2023)   Hunger Vital Sign    Worried About Running Out of Food in the Last Year: Never true    Ran Out of Food in the Last Year: Never true  Transportation Needs: No Transportation Needs (12/31/2023)   PRAPARE - Administrator, Civil Service (Medical): No    Lack of Transportation (Non-Medical): No  Physical Activity: Insufficiently Active (12/31/2023)   Exercise Vital Sign    Days of Exercise per Week: 2 days    Minutes of Exercise per Session: 30 min  Stress: No Stress Concern Present (12/31/2023)   [employer] of Occupational Health - Occupational Stress Questionnaire    Feeling of Stress: Only a little  Social Connections: Socially Integrated (12/31/2023)   Social Connection and Isolation Panel    Frequency of Communication with Friends and  Family: More than three times a week    Frequency of Social Gatherings with Friends and Family: Three times a week    Attends Religious Services: More than 4 times per year    Active Member of Clubs or Organizations: Yes    Attends Banker Meetings: More than 4 times per year    Marital Status: Married  Catering manager Violence: Not At Risk (01/01/2024)   Humiliation, Afraid, Rape, and Kick questionnaire    Fear of Current or Ex-Partner: No    Emotionally Abused: No    Physically Abused: No    Sexually Abused: No    Family History  Problem Relation Age of Onset   Heart attack Mother    Heart disease Mother    Hypertension Father    Depression Father    Kidney cancer Neg Hx    Prostate cancer Neg Hx      Vitals:   01/01/24 1251 01/01/24 1254  BP:  133/77  Pulse:  79  Resp:  15  Temp:  98 F (36.7 C)  SpO2:  97%  Weight: 78.9 kg   Height: 5' 10 (1.778 m)     PHYSICAL EXAM General: Well appearing elderly male, well nourished, in no acute distress. HEENT: Normocephalic and atraumatic. Neck: No JVD.   Lungs: Normal respiratory effort on room air. Clear bilaterally to auscultation. No wheezes, crackles, rhonchi.  Heart: HRRR. Normal S1 and S2 without gallops or murmurs.  Abdomen: Non-distended appearing.  Msk: Normal strength and tone for age. Extremities: Warm and well perfused. No clubbing, cyanosis, edema.  Neuro: Alert and oriented X 3. Psych: Answers questions appropriately.   Labs: Basic Metabolic Panel: Recent Labs    01/01/24 1303  NA 137  K 4.5  CL 98  CO2 28  GLUCOSE 99  BUN 28*  CREATININE 1.44*  CALCIUM  9.4   Liver Function Tests: Recent Labs    01/01/24 1303  AST 31  ALT 18  ALKPHOS 51  BILITOT 0.6  PROT 6.5  ALBUMIN 3.5   No results for input(s): LIPASE, AMYLASE in the last 72 hours. CBC: Recent Labs    01/01/24 1303  WBC 5.9  HGB 10.6*  HCT 32.4*  MCV 97.0  PLT 258   Cardiac Enzymes: Recent Labs     01/01/24 1303  TROPONINIHS 8   BNP: No results for input(s): BNP in the last 72 hours. D-Dimer: No results for input(s): DDIMER in the last 72 hours. Hemoglobin A1C: No results for input(s): HGBA1C in the last 72 hours. Fasting Lipid Panel: No results for input(s): CHOL, HDL, LDLCALC, TRIG, CHOLHDL, LDLDIRECT in the  last 72 hours. Thyroid  Function Tests: Recent Labs    01/01/24 1404  TSH 1.416   Anemia Panel: No results for input(s): VITAMINB12, FOLATE, FERRITIN, TIBC, IRON, RETICCTPCT in the last 72 hours.   Radiology: No results found.  ECHO ordered  TELEMETRY reviewed by me 01/01/2024: second degree AVB mobitz 1 with PVCs, rate 80s  EKG reviewed by me: second degree AVB, mobitz 1 rate 78 bpm   Data reviewed by me 01/01/2024: last 24h vitals tele labs imaging I/O ED provider note, admission H&P.  Active Problems:   * No active hospital problems. *    ASSESSMENT AND PLAN:   James Hood is a 83 y.o. 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 Patient states he's had issues with recurrent falls for past 2-3 years and it's always associated with loss of balance. Arrived to ED as falls have been more frequent, fell 4x this week. Patient denies any chest pain, SOB, palpitations, syncope or lightheadedness. Reports intermittent dizziness when he lays down or gets up for the past week after he hit his head 1 week ago. EKG and telemetry revealed second degree AVB, Mobitz 1. Cardiology was consulted for further evaluation.   # Second degree AVB, mobitz 1  Patient has known hx of mobitz 1, visuible EKG with this rhythm on 03/14/2023. EKG in ED with second degree AVB, mobitz 1 rate 78 bpm. Per tele demonstrates the same rhythm with rates 70-80s. No evidence of significant pauses or bradycardia.  Patient remains aSX.  -Echo ordered. -Monitor tele closely for any significant pauses or bradycardia.  -Hold home low dose metoprolol  succinate for now.  -If CT head negative, plan to place holter monitor prior to discharge for further evaluation.   # Recurrent Falls # Balance issues -CT head pending. -Management per primary team.  # Hypertension -Resume home antihypertensive medications.   This patient's plan of care was discussed and created with Dr. Ammon and he is in agreement.  Signed: Dorene Comfort, PA-C  01/01/2024, 3:04 PM [facility]

## 2024-01-01 NOTE — Progress Notes (Signed)
 Leron Glance, NP-C Phone: [phone]  James Hood is a 83 y.o. male who presents today for follow up and frequent falls.   Discussed the use of AI scribe software for clinical note transcription with the patient, who gave verbal consent to proceed.  History of Present Illness   James Hood is an 83 year old male who presents with frequent falls.  He has experienced four falls in the past week, which occur suddenly and without warning, attributed to a loss of balance rather than tripping or leg weakness. He has difficulty standing for long periods due to severe back pain, necessitating sitting down, and struggles to get up after falling due to insufficient arm strength.  He has a history of balance problems that have worsened over time. He uses a cane but acknowledges that his balance issues have progressed. Although he has a walker, he finds it cumbersome to transport and is considering obtaining a more manageable one.  He experiences dizziness, which he suspects might be related to hitting his head during falls. He recalls a recent incident where he fell off the commode after falling asleep and had difficulty getting up. He was hospitalized recently for dizziness and dehydration, during which a MRI brain was performed and reported as normal. He recounts a specific fall at a service station last week where he hit his head on concrete, resulting in a bruise. Despite EMTs being called, he declined hospital transport. He has not had imaging since this incident.  He denies that his back pain contributes to his falls, attributing them instead to balance issues. No shortness of breath, heart palpitations, or chest pain.  He manages his diabetes with Tradjenta , Farxiga , and metformin , reporting that his blood sugar levels are generally stable, though they rise after meals before returning to normal. He denies any vision changes or speech problems but admits to some confusion, which he  attributes to age.     Social History   Tobacco Use  Smoking Status Every Day   Current packs/day: 0.50   Average packs/day: 0.5 packs/day for 68.6 years (34.3 ttl pk-yrs)   Types: Cigarettes   Start date: 56   Passive exposure: Current  Smokeless Tobacco Never    Current Outpatient Medications on File Prior to Visit  Medication Sig Dispense Refill   aspirin  EC 81 MG tablet Take 81 mg by mouth daily.     buPROPion  (WELLBUTRIN  XL) 150 MG 24 hr tablet TAKE 1 TABLET BY MOUTH DAILY 90 tablet 0   Continuous Glucose Receiver (FREESTYLE LIBRE 2 READER) DEVI Use to check glucose at least every 8 hours 1 each 0   Continuous Glucose Sensor (FREESTYLE LIBRE 2 SENSOR) MISC Apply every 14 days to check glucose 4 each 3   cyclobenzaprine  (FLEXERIL ) 10 MG tablet Take 1 tablet (10 mg total) by mouth 3 (three) times daily as needed for muscle spasms. 30 tablet 2   dapagliflozin  propanediol (FARXIGA ) 10 MG TABS tablet Take 10 mg by mouth daily.     diphenoxylate -atropine  (LOMOTIL ) 2.5-0.025 MG tablet Take by mouth.     divalproex  (DEPAKOTE  ER) 500 MG 24 hr tablet Take 1 tablet by mouth 2 (two) times daily.     donepezil  (ARICEPT ) 10 MG tablet Take 10 mg by mouth at bedtime.     DULoxetine  (CYMBALTA ) 30 MG capsule TAKE 2 CAPSULES BY MOUTH DAILY 60 capsule 3   fenofibrate  (TRICOR ) 145 MG tablet Take 1 tablet (145 mg total) by mouth daily. 90 tablet  3   gabapentin  (NEURONTIN ) 600 MG tablet TAKE TWO TABLETS BY MOUTH THREE TIMES A DAY 180 tablet 1   lidocaine  (LIDODERM ) 5 % Place 1 patch onto the skin every 12 (twelve) hours. Remove & Discard patch within 12 hours or as directed by MD (Patient not taking: Reported on 01/01/2024) 30 patch 2   linagliptin  (TRADJENTA ) 5 MG TABS tablet Take 1 tablet (5 mg total) by mouth daily. 90 tablet 3   lisinopril -hydrochlorothiazide  (ZESTORETIC ) 20-25 MG tablet Take 1 tablet by mouth 2 (two) times daily. 180 tablet 3   memantine  (NAMENDA ) 10 MG tablet Take 1 tablet by  mouth daily.     metFORMIN  (GLUCOPHAGE -XR) 500 MG 24 hr tablet Take 1 tablet (500 mg total) by mouth 2 (two) times daily. 180 tablet 1   metoprolol  succinate (TOPROL -XL) 25 MG 24 hr tablet Take 0.5 tablets (12.5 mg total) by mouth daily. 45 tablet 3   Multiple Vitamins-Minerals (CENTRUM SILVER PO) Take 1 tablet by mouth daily.     nystatin  (MYCOSTATIN ) 100000 UNIT/ML suspension Take 5 mLs (500,000 Units total) by mouth 4 (four) times daily. Swish in the mouth and retain for as long as possible (several minutes) before swallowing. Use for 7-14 days. (Patient taking differently: Take 5 mLs by mouth 4 (four) times daily as needed. Swish in the mouth and retain for as long as possible (several minutes) before swallowing. Use for 7-14 days.) 120 mL 0   omega-3 acid ethyl esters (LOVAZA ) 1 g capsule TAKE 2 CAPSULES BY MOUTH TWICE A DAY 360 capsule 3   omeprazole  (PRILOSEC ) 20 MG capsule Take 20 mg by mouth daily.     oxyCODONE -acetaminophen  (PERCOCET) 10-325 MG tablet Take 1 tablet by mouth every 6 (six) hours as needed for pain. 120 tablet 0   pantoprazole  (PROTONIX ) 40 MG tablet Take 40 mg by mouth daily.     pramipexole  (MIRAPEX ) 0.125 MG tablet TAKE 1 TABLET BY MOUTH EVERY NIGHT 2-3 HOURS BEFORE BEDTIME 90 tablet 1   primidone  (MYSOLINE ) 50 MG tablet Take 150-250 mg by mouth 2 (two) times daily. Take 250 mg by mouth in the morning & take 150 mg at night.     QUEtiapine  (SEROQUEL ) 25 MG tablet Take 75 mg by mouth at bedtime.     rosuvastatin  (CRESTOR ) 40 MG tablet Take 1 tablet (40 mg total) by mouth daily. 90 tablet 3   tamsulosin  (FLOMAX ) 0.4 MG CAPS capsule TAKE 2 CAPSULES BY MOUTH DAILY AFTER SUPPER 180 capsule 0   traZODone  (DESYREL ) 100 MG tablet Take 2 tablets (200 mg total) by mouth at bedtime as needed for sleep. 180 tablet 1   Trospium  Chloride 60 MG CP24 Take 1 capsule (60 mg total) by mouth daily. 30 capsule 11   vitamin C  (ASCORBIC ACID ) 500 MG tablet Take 1,000 mg by mouth daily. (Patient  taking differently: Take 1,000 mg by mouth daily as needed.)     No current facility-administered medications on file prior to visit.     ROS see history of present illness  Objective  Physical Exam Vitals:   01/01/24 1051  BP: 132/84  Pulse: 81  Temp: 97.7 F (36.5 C)  SpO2: 97%    BP Readings from Last 3 Encounters:  01/01/24 133/77  01/01/24 132/84  12/04/23 125/72   Wt Readings from Last 3 Encounters:  01/01/24 174 lb (78.9 kg)  01/01/24 172 lb 9.6 oz (78.3 kg)  01/01/24 165 lb (74.8 kg)    Physical Exam Constitutional:  General: He is not in acute distress.    Appearance: Normal appearance.  HENT:     Head: Normocephalic.  Cardiovascular:     Rate and Rhythm: Normal rate and regular rhythm.     Heart sounds: Normal heart sounds.  Pulmonary:     Effort: Pulmonary effort is normal.     Breath sounds: Normal breath sounds.  Skin:    General: Skin is warm and dry.  Neurological:     General: No focal deficit present.     Mental Status: He is alert.  Psychiatric:        Mood and Affect: Mood normal.        Behavior: Behavior normal.      Assessment/Plan: Please see individual problem list.  Abnormal EKG Assessment & Plan: EKG indicates heart block. Concern with recent increase in falls - 4 in the last week. Metoprolol  may exacerbate the heart block. Discontinue metoprolol . Advise immediate hospital evaluation for cardiac monitoring and further assessment. EMS called to transport patient.    Falls Assessment & Plan: He has experienced multiple falls with worsening balance and dizziness. Recent scans showed no abnormalities, and there has been no recent neurology consultation. In setting of abnormal EKG, advised ED for further evaluation.   Orders: -     EKG 12-Lead  Type 2 diabetes mellitus with diabetic polyneuropathy, without long-term current use of insulin  (HCC) Assessment & Plan: Blood sugar levels are adequately controlled with current  medications. Continue current diabetes medications: Tradjenta , Farxiga , and metformin .       No follow-ups on file.   Leron Glance, NP-C [facility]

## 2024-01-01 NOTE — ED Provider Notes (Signed)
 [facility] Provider Note    Event Date/Time   First MD Initiated Contact with Patient 01/01/24 1304     (approximate)   History   Weakness   HPI  James Hood is a 83 y.o. male with a history of hypertension, non-insulin -dependent diabetes, GERD, BPH, depression, anxiety, memory decline who presents with weakness and multiple falls.  The patient states that over the last few months he has had increased issues with balance and falls.  He states that he most recently fell earlier this morning when he was on the commode and fell asleep.  He has hit his head multiple times.  He reports feeling dizzy, describes a sensation of lightheadedness.  He states that he usually will lose his balance causing him to fall.  He denies actually passing out.  He has no chest pain or difficulty breathing.  Denies any vomiting or diarrhea.  He has no urinary symptoms.  He does feel that he has a dry mouth.  He was sent in from the outpatient clinic due to a concern of heart block seen on his EKG.  I reviewed the past medical records.  The patient was admitted briefly to the hospitalist service last month after presenting with intractable nausea and vomiting as well as altered mental status.  Workup was overall negative.   Physical Exam   Triage Vital Signs: ED Triage Vitals  Encounter Vitals Group     BP 01/01/24 1254 133/77     Girls Systolic BP Percentile --      Girls Diastolic BP Percentile --      Boys Systolic BP Percentile --      Boys Diastolic BP Percentile --      Pulse Rate 01/01/24 1254 79     Resp 01/01/24 1254 15     Temp 01/01/24 1254 98 F (36.7 C)     Temp src --      SpO2 01/01/24 1254 97 %     Weight 01/01/24 1251 174 lb (78.9 kg)     Height 01/01/24 1251 5' 10 (1.778 m)     Head Circumference --      Peak Flow --      Pain Score 01/01/24 1251 0     Pain Loc --      Pain Education --      Exclude from Growth Chart --     Most recent vital  signs: Vitals:   01/01/24 1254  BP: 133/77  Pulse: 79  Resp: 15  Temp: 98 F (36.7 C)  SpO2: 97%     General: Alert, no distress.  CV:  Good peripheral perfusion.  Resp:  Normal effort.  Lungs CTAB. Abd:  No distention.  Other:  EOMI.  PERRLA.  No photophobia.  Normal speech.  Motor intact in all extremities.  No ataxia finger-to-nose.  Dry mucous membranes.   ED Results / Procedures / Treatments   Labs (all labs ordered are listed, but only abnormal results are displayed) Labs Reviewed  COMPREHENSIVE METABOLIC PANEL WITH GFR - Abnormal; Notable for the following components:      Result Value   BUN 28 (*)    Creatinine, Ser 1.44 (*)    GFR, Estimated 48 (*)    All other components within normal limits  CBC - Abnormal; Notable for the following components:   RBC 3.34 (*)    Hemoglobin 10.6 (*)    HCT 32.4 (*)    All other components within  normal limits  URINALYSIS, ROUTINE W REFLEX MICROSCOPIC - Abnormal; Notable for the following components:   Color, Urine YELLOW (*)    APPearance CLEAR (*)    All other components within normal limits  TSH  CBG MONITORING, ED  TROPONIN I (HIGH SENSITIVITY)     EKG  ED ECG REPORT I, Waylon Cassis, the attending physician, personally viewed and interpreted this ECG.  Date: 01/01/2024 EKG Time: 1257 Rate: 78 Rhythm: normal sinus rhythm QRS Axis: normal Intervals: Prolonged PR ST/T Wave abnormalities: normal Narrative Interpretation: no evidence of acute ischemia   ED ECG REPORT I, Waylon Cassis, the attending physician, personally viewed and interpreted this ECG.  Date: 01/01/2024 EKG Time: 1406 Rate: 71 Rhythm: Second-degree AV block, Mobitz type I  QRS Axis: normal Intervals: normal ST/T Wave abnormalities: normal Narrative Interpretation: no evidence of acute ischemia   RADIOLOGY  CT head: I independently viewed and interpreted the images; there is no ICH or mass effect.  Radiology report is  pending.   PROCEDURES:  Critical Care performed: No  Procedures   MEDICATIONS ORDERED IN ED: Medications  gabapentin  (NEURONTIN ) capsule 600 mg (has no administration in time range)  oxyCODONE -acetaminophen  (PERCOCET/ROXICET) 5-325 MG per tablet 2 tablet (has no administration in time range)  sodium chloride  0.9 % bolus 500 mL (500 mLs Intravenous New Bag/Given 01/01/24 1408)     IMPRESSION / MDM / ASSESSMENT AND PLAN / ED COURSE  I reviewed the triage vital signs and the nursing notes.  83 year old male with PMH as noted above presents with worsening generalized weakness, frequent falls and difficulty with balance.  His last fall was this morning around 3 AM.  On exam his vital signs are normal.  Neurologic exam is nonfocal.  He has no visible trauma.  Mucous membranes are dry.  Differential diagnosis includes, but is not limited to, dehydration, electrolyte abnormality, other metabolic cause, NPH or other subacute to chronic CNS etiology such as a subacute CVA.  Since the patient is primarily reporting difficulty with balance I have a low suspicion that the first-degree heart block seen on his EKG is clinically significant.  We will obtain lab workup, CT head, give a fluid bolus, and reassess.  Patient's presentation is most consistent with acute complicated illness / injury requiring diagnostic workup.  The patient is on the cardiac monitor to evaluate for evidence of arrhythmia and/or significant heart rate changes  ----------------------------------------- 2:36 PM on 01/01/2024 -----------------------------------------  Repeat EKG is much more clear and shows a second-degree Mobitz I heart block.  The patient apparently has had this on prior EKGs.  I consulted and discussed the case with Dr. Paraschos and PA Decoste from cardiology who will evaluate the patient.  CMP and CBC show no acute findings.  Troponin is negative.    ----------------------------------------- 3:14 PM  on 01/01/2024 -----------------------------------------  CT appears negative but radiology read is pending.  Cardiology recommends observation on the cardiac monitor for possible bradycardia, as well as an echocardiogram.  I consulted the hospitalist; based on our discussion he agrees to evaluate the patient for admission.   FINAL CLINICAL IMPRESSION(S) / ED DIAGNOSES   Final diagnoses:  Generalized weakness  Recurrent falls  Mobitz type 1 second degree AV block     Rx / DC Orders   ED Discharge Orders     None        Note:  This document was prepared using Dragon voice recognition software and may include unintentional dictation errors.    Yianni Skilling,  Waylon, MD 01/01/24 1515

## 2024-01-01 NOTE — Assessment & Plan Note (Addendum)
 EKG indicates heart block. Concern with recent increase in falls - 4 in the last week. Metoprolol  may exacerbate the heart block. Discontinue metoprolol . Advise immediate hospital evaluation for cardiac monitoring and further assessment. EMS called to transport patient.

## 2024-01-01 NOTE — Assessment & Plan Note (Signed)
 Blood sugar levels are adequately controlled with current medications. Continue current diabetes medications: Tradjenta , Farxiga , and metformin .

## 2024-01-01 NOTE — Progress Notes (Signed)
 Subjective:   James Hood is a 83 y.o. who presents for a Medicare Wellness preventive visit.  As a reminder, Annual Wellness Visits don't include a physical exam, and some assessments may be limited, especially if this visit is performed virtually. We may recommend an in-person follow-up visit with your provider if needed.  Visit Complete: Virtual I connected with  James Hood on 01/01/24 by a audio enabled telemedicine application and verified that I am speaking with the correct person using two identifiers.  Patient Location: Home  Provider Location: Home Office  I discussed the limitations of evaluation and management by telemedicine. The patient expressed understanding and agreed to proceed.  Vital Signs: Because this visit was a virtual/telehealth visit, some criteria may be missing or patient reported. Any vitals not documented were not able to be obtained and vitals that have been documented are patient reported.  VideoDeclined- This patient declined Librarian, academic. Therefore the visit was completed with audio only.  Persons Participating in Visit: Patient.  AWV Questionnaire: Yes: Patient Medicare AWV questionnaire was completed by the patient on 12/31/23; I have confirmed that all information answered by patient is correct and no changes since this date.  Cardiac Risk Factors include: advanced age (>52men, >69 women);diabetes mellitus;dyslipidemia;hypertension;male gender;smoking/ tobacco exposure;sedentary lifestyle     Objective:    Today's Vitals   01/01/24 0813 01/01/24 0814  Weight: 165 lb (74.8 kg)   Height: 5' 10 (1.778 m)   PainSc:  4    Body mass index is 23.68 kg/m.     01/01/2024    8:39 AM 12/04/2023   11:09 AM 11/29/2023   11:20 AM 11/06/2023    3:52 PM 10/16/2023    8:13 AM 09/24/2023    9:28 AM 08/21/2023    1:09 PM  Advanced Directives  Does Patient Have a Medical Advance Directive? Yes No No No Yes Yes Yes   Type of Estate agent of [place];Living will     Healthcare Power of [place];Living will   Does patient want to make changes to medical advance directive? No - Patient declined        Copy of Healthcare Power of Attorney in Chart? Yes - validated most recent copy scanned in chart (See row information)        Would patient like information on creating a medical advance directive?    No - Patient declined       Current Medications (verified) Outpatient Encounter Medications as of 01/01/2024  Medication Sig   aspirin  EC 81 MG tablet Take 81 mg by mouth daily.   buPROPion  (WELLBUTRIN  XL) 150 MG 24 hr tablet TAKE 1 TABLET BY MOUTH DAILY   cholestyramine (QUESTRAN) 4 g packet Take 1 packet by mouth 2 (two) times daily.   Continuous Glucose Receiver (FREESTYLE LIBRE 2 READER) DEVI Use to check glucose at least every 8 hours   Continuous Glucose Sensor (FREESTYLE LIBRE 2 SENSOR) MISC Apply every 14 days to check glucose   cyclobenzaprine  (FLEXERIL ) 10 MG tablet Take 1 tablet (10 mg total) by mouth 3 (three) times daily as needed for muscle spasms.   dapagliflozin  propanediol (FARXIGA ) 10 MG TABS tablet Take 10 mg by mouth daily.   diphenoxylate -atropine  (LOMOTIL ) 2.5-0.025 MG tablet Take by mouth.   divalproex  (DEPAKOTE  ER) 500 MG 24 hr tablet Take 1 tablet by mouth 2 (two) times daily.   donepezil  (ARICEPT ) 10 MG tablet Take 10 mg by mouth at bedtime.   DULoxetine  (  CYMBALTA ) 30 MG capsule TAKE 2 CAPSULES BY MOUTH DAILY   fenofibrate  (TRICOR ) 145 MG tablet Take 1 tablet (145 mg total) by mouth daily.   gabapentin  (NEURONTIN ) 600 MG tablet TAKE TWO TABLETS BY MOUTH THREE TIMES A DAY   linagliptin  (TRADJENTA ) 5 MG TABS tablet Take 1 tablet (5 mg total) by mouth daily.   lisinopril -hydrochlorothiazide  (ZESTORETIC ) 20-25 MG tablet Take 1 tablet by mouth 2 (two) times daily.   memantine  (NAMENDA ) 10 MG tablet Take 1 tablet by mouth daily.   metFORMIN  (GLUCOPHAGE -XR) 500 MG 24 hr  tablet Take 1 tablet (500 mg total) by mouth 2 (two) times daily.   metoprolol  succinate (TOPROL -XL) 25 MG 24 hr tablet Take 0.5 tablets (12.5 mg total) by mouth daily.   Multiple Vitamins-Minerals (CENTRUM SILVER PO) Take 1 tablet by mouth daily.   nystatin  (MYCOSTATIN ) 100000 UNIT/ML suspension Take 5 mLs (500,000 Units total) by mouth 4 (four) times daily. Swish in the mouth and retain for as long as possible (several minutes) before swallowing. Use for 7-14 days. (Patient taking differently: Take 5 mLs by mouth 4 (four) times daily as needed. Swish in the mouth and retain for as long as possible (several minutes) before swallowing. Use for 7-14 days.)   omega-3 acid ethyl esters (LOVAZA ) 1 g capsule TAKE 2 CAPSULES BY MOUTH TWICE A DAY   omeprazole  (PRILOSEC ) 20 MG capsule Take 20 mg by mouth daily.   oxyCODONE -acetaminophen  (PERCOCET) 10-325 MG tablet Take 1 tablet by mouth every 6 (six) hours as needed for pain.   pantoprazole  (PROTONIX ) 40 MG tablet Take 40 mg by mouth daily.   pramipexole  (MIRAPEX ) 0.125 MG tablet TAKE 1 TABLET BY MOUTH EVERY NIGHT 2-3 HOURS BEFORE BEDTIME   primidone  (MYSOLINE ) 50 MG tablet Take 150-250 mg by mouth 2 (two) times daily. Take 250 mg by mouth in the morning & take 150 mg at night.   QUEtiapine  (SEROQUEL ) 25 MG tablet Take 75 mg by mouth at bedtime.   rosuvastatin  (CRESTOR ) 40 MG tablet Take 1 tablet (40 mg total) by mouth daily.   tamsulosin  (FLOMAX ) 0.4 MG CAPS capsule TAKE 2 CAPSULES BY MOUTH DAILY AFTER SUPPER   traZODone  (DESYREL ) 100 MG tablet Take 2 tablets (200 mg total) by mouth at bedtime as needed for sleep.   Trospium  Chloride 60 MG CP24 Take 1 capsule (60 mg total) by mouth daily.   vitamin C  (ASCORBIC ACID ) 500 MG tablet Take 1,000 mg by mouth daily. (Patient taking differently: Take 1,000 mg by mouth daily as needed.)   lidocaine  (LIDODERM ) 5 % Place 1 patch onto the skin every 12 (twelve) hours. Remove & Discard patch within 12 hours or as  directed by MD (Patient not taking: Reported on 01/01/2024)   No facility-administered encounter medications on file as of 01/01/2024.    Allergies (verified) Patient has no known allergies.   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   Colon polyps    Complication of anesthesia    Woke during first cataract procedure   Dementia (HCC)    Depression    Diabetes mellitus type 2 in nonobese (HCC)    GERD (gastroesophageal reflux disease)    BARRETTS ESOPHAGUS RESOLVED PER PATIENT   Headache    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HCC)    History of hiatal hernia  Hypercholesteremia    Hypertension    Hyperthyroidism    Intractable nausea and vomiting 11/29/2023   Intractable nausea and vomiting 11/29/2023   Nausea & vomiting 11/30/2023   Neuropathic pain    New onset headache 09/09/2015   Primary localized osteoarthritis of right knee    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   Stomach ulcer    Tachycardia 11/29/2023   Tremor, essential    Wears dentures    full upper   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/02/2018   Past Surgical History:  Procedure Laterality Date   ANKLE ARTHROSCOPY Left 09/29/2019   Procedure: LEFT ANKLE ARTHROSCOPY, DEBRIDEMENT;  Surgeon: Harden Jerona GAILS, MD;  Location: [facility];  Service: Orthopedics;  Laterality: Left;   BACK SURGERY     BIOPSY  01/18/2023   Procedure: BIOPSY;  Surgeon: Onita Elspeth Sharper, DO;  Location: [facility] END[facility]COPY;  Service: Gastroenterology;;   CATARACT EXTRACTION W/PHACO Right 03/12/2023   Procedure: CATARACT EXTRACTION PHACO AND INTRAOCULAR LENS PLACEMENT (IOC) RIGHT DIABETIC 11.25 01:07.6;  Surgeon: Jaye Fallow, MD;  Location: [facility];  Service: Ophthalmology;  Laterality: Right;   CATARACT EXTRACTION W/PHACO Left  03/25/2023   Procedure: CATARACT EXTRACTION PHACO AND INTRAOCULAR LENS PLACEMENT (IOC) LEFT DIABETIC 7.15 00:51.0;  Surgeon: Jaye Fallow, MD;  Location: [facility];  Service: Ophthalmology;  Laterality: Left;   COLONOSCOPY WITH PROPOFOL  N/A 09/14/2016   Procedure: COLONOSCOPY WITH PROPOFOL ;  Surgeon: James ONEIDA Holmes, MD;  Location: [facility] ENDOSCOPY;  Service: Endoscopy;  Laterality: N/A;   esophageal stretch     ESOPHAGOGASTRODUODENOSCOPY (EGD) WITH PROPOFOL  N/A 12/13/2020   Procedure: ESOPHAGOGASTRODUODENOSCOPY (EGD) WITH PROPOFOL ;  Surgeon: Maryruth Ole ONEIDA, MD;  Location: [facility] ENDOSCOPY;  Service: Endoscopy;  Laterality: N/A;  IDDM   ESOPHAGOGASTRODUODENOSCOPY (EGD) WITH PROPOFOL  N/A 01/18/2023   Procedure: ESOPHAGOGASTRODUODENOSCOPY (EGD) WITH PROPOFOL ;  Surgeon: Onita Elspeth Sharper, DO;  Location: [facility] ENDOSCOPY;  Service: Gastroenterology;  Laterality: N/A;   JOINT REPLACEMENT Right 2016   knee   KNEE SURGERY Left 10/2023   placed a stimulator in knee then was removed several months later   LUMBAR LAMINECTOMY/DECOMPRESSION MICRODISCECTOMY Left 02/03/2016   Procedure: LEFT L5-S1 DISKECTOMY;  Surgeon: Catalina Stains, MD;  Location: [facility];  Service: Neurosurgery;  Laterality: Left;  LEFT L5-S1 DISKECTOMY   MALONEY DILATION  01/18/2023   Procedure: MALONEY DILATION;  Surgeon: Onita Elspeth Sharper, DO;  Location: [facility] END[facility]COPY;  Service: Gastroenterology;;   PULSE GENERATOR IMPLANT N/A 08/21/2017   Procedure: UNILATERAL PULSE GENERATOR IMPLANT;  Surgeon: Clois Fret, MD;  Location: [facility] ORS;  Service: Neurosurgery;  Laterality: N/A;   PULSE GENERATOR IMPLANT Right 04/02/2018   Procedure: REMOVAL OF PULSE GENERATOR IMPLANT AND LEADS;  Surgeon: Clois Fret, MD;  Location: [facility] ORS;  Service: Neurosurgery;  Laterality: Right;   TONSILLECTOMY     TOTAL KNEE ARTHROPLASTY Right 11/15/2014   Procedure: TOTAL KNEE ARTHROPLASTY;  Surgeon: James Millman, MD;   Location: [facility] OR;  Service: Orthopedics;  Laterality: Right;   Family History  Problem Relation Age of Onset   Heart attack Mother    Heart disease Mother    Hypertension Father    Depression Father    Kidney cancer Neg Hx    Prostate cancer Neg Hx    Social History   Socioeconomic History   Marital status: Married    Spouse name: Not on file   Number of children: Not on file   Years of education: Not on file   Highest  education level: Master's degree (e.g., MA, MS, MEng, MEd, MSW, MBA)  Occupational History   Not on file  Tobacco Use   Smoking status: Every Day    Current packs/day: 0.50    Average packs/day: 0.5 packs/day for 68.6 years (34.3 ttl pk-yrs)    Types: Cigarettes    Start date: 3    Passive exposure: Current   Smokeless tobacco: Never  Vaping Use   Vaping status: Former  Substance and Sexual Activity   Alcohol  use: Not Currently    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   Drug use: Never   Sexual activity: Not Currently  Other Topics Concern   Not on file  Social History Narrative   married   Social Drivers of Health   Financial Resource Strain: Medium Risk (12/31/2023)   Overall Financial Resource Strain (CARDIA)    Difficulty of Paying Living Expenses: Somewhat hard  Food Insecurity: No Food Insecurity (12/31/2023)   Hunger Vital Sign    Worried About Running Out of Food in the Last Year: Never true    Ran Out of Food in the Last Year: Never true  Transportation Needs: No Transportation Needs (12/31/2023)   PRAPARE - Administrator, Civil Service (Medical): No    Lack of Transportation (Non-Medical): No  Physical Activity: Insufficiently Active (12/31/2023)   Exercise Vital Sign    Days of Exercise per Week: 2 days    Minutes of Exercise per Session: 30 min  Stress: No Stress Concern Present (12/31/2023)   [employer] of Occupational Health - Occupational Stress Questionnaire    Feeling of Stress: Only a little  Social  Connections: Socially Integrated (12/31/2023)   Social Connection and Isolation Panel    Frequency of Communication with Friends and Family: More than three times a week    Frequency of Social Gatherings with Friends and Family: Three times a week    Attends Religious Services: More than 4 times per year    Active Member of Clubs or Organizations: Yes    Attends Engineer, structural: More than 4 times per year    Marital Status: Married    Tobacco Counseling Ready to quit: Not Answered Counseling given: Not Answered    Clinical Intake:  Pre-visit preparation completed: Yes  Pain : 0-10 (four falls in the past week) Pain Score: 4  Pain Type: Acute pain Pain Location: Knee Pain Orientation: Right Pain Descriptors / Indicators: Nagging Pain Onset: In the past 7 days Pain Frequency: Intermittent     BMI - recorded: 23.68 Nutritional Status: BMI of 19-24  Normal Nutritional Risks: None Diabetes: Yes CBG done?: Yes (FBS 157 per patient)  Lab Results  Component Value Date   HGBA1C 7.3 (H) 09/30/2023   HGBA1C 7.1 (A) 07/02/2023   HGBA1C 7.1 07/02/2023   HGBA1C 7.1 (A) 07/02/2023   HGBA1C 7.1 (A) 07/02/2023     How often do you need to have someone help you when you read instructions, pamphlets, or other written materials from your doctor or pharmacy?: 1 - Never  Interpreter Needed?: No  Information entered by :: R. Rushawn Capshaw LPN   Activities of Daily Living     01/01/2024    8:17 AM 03/25/2023    8:43 AM  In your present state of health, do you have any difficulty performing the following activities:  Hearing? 1 0  Vision? 0 0  Difficulty concentrating or making decisions? 1 0  Walking or climbing stairs? 1   Dressing  or bathing? 0   Doing errands, shopping? 0   Preparing Food and eating ? N   Using the Toilet? N   In the past six months, have you accidently leaked urine? Y   Do you have problems with loss of bowel control? Y   Managing your Medications?  Y   Comment wife handles   Managing your Finances? Y   Comment wife Clinical biochemist or managing your Housekeeping? Y     Patient Care Team: Gretel App, NP as PCP - General (Nurse Practitioner) Tobie Emmy POUR, NP as Nurse Practitioner (Nurse Practitioner) Lane Arthea BRAVO, MD as Referring Physician (Neurology)  I have updated your Care Teams any recent Medical Services you may have received from other providers in the past year.     Assessment:   This is a routine wellness examination for Jakaree.  Hearing/Vision screen Hearing Screening - Comments:: Some issues Vision Screening - Comments:: glasses   Goals Addressed             This Visit's Progress    Patient Stated       Wants to plan on going to the GYM two days a week       Depression Screen     01/01/2024    8:34 AM 09/30/2023    9:55 AM 08/26/2023   10:00 AM 08/21/2023    1:09 PM 07/08/2023   10:03 AM 06/18/2023   11:25 AM 05/06/2023    2:15 PM  PHQ 2/9 Scores  PHQ - 2 Score 0 2  0 1 0 4  PHQ- 9 Score 1 7     10      Information is confidential and restricted. Go to Review Flowsheets to unlock data.    Fall Risk     01/01/2024    8:21 AM 12/04/2023   11:10 AM 09/30/2023    9:55 AM 09/24/2023    9:28 AM 08/21/2023    1:09 PM  Fall Risk   Falls in the past year? 1 0 1 1 1   Number falls in past yr: 1  1 1  0  Injury with Fall? 1  0 0 1  Risk for fall due to : History of fall(s);Impaired balance/gait;Impaired mobility  No Fall Risks;Impaired mobility    Follow up Falls evaluation completed;Falls prevention discussed  Falls evaluation completed      MEDICARE RISK AT HOME:  Medicare Risk at Home Any stairs in or around the home?: Yes If so, are there any without handrails?: No Home free of loose throw rugs in walkways, pet beds, electrical cords, etc?: No (discussed getting rid of loose throw rugs) Adequate lighting in your home to reduce risk of falls?: Yes Life alert?: No Use of a cane, walker or  w/c?: Yes Grab bars in the bathroom?: No Shower chair or bench in shower?: No Elevated toilet seat or a handicapped toilet?: No  TIMED UP AND GO:  Was the test performed?  No  Cognitive Function: 6CIT completed    11/15/2017    8:47 AM 11/14/2016    4:50 PM  MMSE - Mini Mental State Exam  Orientation to time 5 5   Orientation to Place 5 5   Registration 3 3   Attention/ Calculation 5 5   Recall 3 3   Language- name 2 objects 2 2   Language- repeat 1 1  Language- follow 3 step command 3 3   Language- read & follow direction 1 1   Write  a sentence 1 1   Copy design 1 1   Total score 30 30      Data saved with a previous flowsheet row definition        01/01/2024    8:39 AM 12/31/2022    8:39 AM 12/29/2021    2:34 PM 12/01/2019    9:57 AM 11/28/2018   10:23 AM  6CIT Screen  What Year? 0 points 0 points 0 points 0 points 0 points  What month? 0 points 0 points 0 points 0 points 0 points  What time? 0 points 0 points 0 points  0 points  Count back from 20  0 points   0 points  Months in reverse 2 points 0 points  0 points 0 points  Repeat phrase 0 points 0 points  0 points 0 points  Total Score  0 points   0 points    Immunizations Immunization History  Administered Date(s) Administered   Fluad Quad(high Dose 65+) 02/05/2020, 02/28/2021, 01/19/2022   Hepatitis A, Adult 12/02/2018   Influenza, High Dose Seasonal PF 01/29/2017, 03/19/2018, 01/10/2019   Influenza,inj,Quad PF,6+ Mos 03/13/2016   Influenza-Unspecified 01/21/2015, 01/25/2022   PFIZER(Purple Top)SARS-COV-2 Vaccination 06/01/2019, 06/22/2019, 03/09/2020, 08/02/2020   Pneumococcal Conjugate-13 02/07/2015   Pneumococcal Polysaccharide-23 01/18/2018   Tdap 09/16/2015, 01/18/2018, 11/06/2023   Zoster Recombinant(Shingrix) 01/03/2018, 05/09/2018   Zoster, Live 09/16/2015    Screening Tests Health Maintenance  Topic Date Due   Diabetic kidney evaluation - Urine ACR  Never done   COVID-19 Vaccine (5 - 2024-25  season) 01/20/2023   Medicare Annual Wellness (AWV)  12/31/2023   OPHTHALMOLOGY EXAM  12/24/2023   INFLUENZA VACCINE  08/18/2024 (Originally 12/20/2023)   FOOT EXAM  02/27/2024   HEMOGLOBIN A1C  04/01/2024   Diabetic kidney evaluation - eGFR measurement  11/29/2024   DTaP/Tdap/Td (4 - Td or Tdap) 11/05/2033   Pneumococcal Vaccine: 50+ Years  Completed   Zoster Vaccines- Shingrix  Completed   Hepatitis B Vaccines  Aged Out   HPV VACCINES  Aged Out   Meningococcal B Vaccine  Aged Out    Health Maintenance  Health Maintenance Due  Topic Date Due   Diabetic kidney evaluation - Urine ACR  Never done   COVID-19 Vaccine (5 - 2024-25 season) 01/20/2023   Medicare Annual Wellness (AWV)  12/31/2023   OPHTHALMOLOGY EXAM  12/24/2023   Health Maintenance Items Addressed: Discussed the need to update flu vaccine annually. Patient declines covid vaccine  Additional Screening:  Vision Screening: Recommended annual ophthalmology exams for early detection of glaucoma and other disorders of the eye. Due now  [facility]  Patient stated that he will call and schedule his appointment.  Would you like a referral to an eye doctor? No    Dental Screening: Recommended annual dental exams for proper oral hygiene  Community Resource Referral / Chronic Care Management: CRR required this visit?  No   CCM required this visit?  No   Plan:    I have personally reviewed and noted the following in the patient's chart:   Medical and social history Use of alcohol , tobacco or illicit drugs  Current medications and supplements including opioid prescriptions. Patient is currently taking opioid prescriptions. Information provided to patient regarding non-opioid alternatives. Patient advised to discuss non-opioid treatment plan with their provider. Functional ability and status Nutritional status Physical activity Advanced directives List of other physicians Hospitalizations, surgeries, and ER visits in  previous 12 months Vitals Screenings to include cognitive, depression, and falls  Referrals and appointments  In addition, I have reviewed and discussed with patient certain preventive protocols, quality metrics, and best practice recommendations. A written personalized care plan for preventive services as well as general preventive health recommendations were provided to patient.   Angeline Fredericks, LPN   [id number]/[id number]   After Visit Summary: (MyChart) Due to this being a telephonic visit, the after visit summary with patients personalized plan was offered to patient via MyChart   Notes: Nothing significant to report at this time.

## 2024-01-01 NOTE — Group Note (Deleted)
 Date:  01/01/2024 Time:  2:22 PM  Group Topic/Focus:  Wellness Toolbox:   The focus of this group is to discuss various aspects of wellness, balancing those aspects and exploring ways to increase the ability to experience wellness.  Patients will create a wellness toolbox for use upon discharge.     Participation Level:  {BHH PARTICIPATION OZCZO:77735}  Participation Quality:  {BHH PARTICIPATION QUALITY:22265}  Affect:  {BHH AFFECT:22266}  Cognitive:  {BHH COGNITIVE:22267}  Insight: {BHH Insight2:20797}  Engagement in Group:  {BHH ENGAGEMENT IN HMNLE:77731}  Modes of Intervention:  {BHH MODES OF INTERVENTION:22269}  Additional Comments:  ***  James Hood 01/01/2024, 2:22 PM

## 2024-01-01 NOTE — ED Triage Notes (Signed)
 Pt comes in from [facility] family practice with complaints of weakness. Pt was seen at his primary care fro multiple falls and shortness of breath. 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 Pt with no complaints of SOB or chest pain at this time. Pt is alert and oriented x4 with no signs of acute distress at this time.

## 2024-01-01 NOTE — H&P (Signed)
 History and Physical    James Hood FMW:[id number] DOB: 23-Jul-1940 DOA: 01/01/2024  DOS: the patient was seen and examined on 01/01/2024  PCP: Gretel App, NP   Patient coming from: Home  I have personally briefly reviewed patient's old medical records in [facility]  Chief Complaint: Falls  HPI: James Hood is a pleasant 83 y.o. male with medical history significant for HTN, history of second-degree AV block (Mobitz type I ) since 2024, recurrent falls/balance issues, type 2 diabetes, RLS who presented to ED at [facility] on 01/01/2024 for recurrent falls at home.  Patient stated that he has issues with recurrent falls for the last 2 to 3 years and most of the time it is associated with loss of balance.  Patient came to ED with more than 4 falls in the last week with multiple bruises in his body.  Patient denied any fever, chest pain, shortness of breath, palpitations, syncope or lightheadedness.  Patient reports intermittent dizziness when he tried to get up.  In the emergency room EKG showed second-degree AV block type I.  Cardiology was consulted from ED for evaluation.  ED Course: Upon arrival to the ED, patient is found to bradycardia second-degree AV block Mobitz type I, sodium 137, potassium 4.5 creatinine 1.44, white count 5.9, TSH within normal limit, UA negative, EKG as mentioned above with a heart rate of 78 bpm.  Patient received IV fluid bolus and CT head has been ordered but pending result.  Hospitalist service was consulted for evaluation for admission for recurrent fall and bradycardia arrhythmias.  Cardiology seen the patient and advised that that patient has recurrent bradycardia since 2024 and advised to hold metoprolol , plan for Holter monitoring if CT head is negative.  Hospitalist service was consulted for evaluation for admission.  Review of Systems:  ROS  All other systems negative except as noted in the HPI.  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   Colon polyps    Complication of anesthesia    Woke during first cataract procedure   Dementia (HCC)    Depression    Diabetes mellitus type 2 in nonobese (HCC)    GERD (gastroesophageal reflux disease)    BARRETTS ESOPHAGUS RESOLVED PER PATIENT   Headache    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HCC)    History of hiatal hernia    Hypercholesteremia    Hypertension    Hyperthyroidism    Intractable nausea and vomiting 11/29/2023   Intractable nausea and vomiting 11/29/2023   Nausea & vomiting 11/30/2023   Neuropathic pain    New onset headache 09/09/2015   Primary localized osteoarthritis of right knee    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   Stomach ulcer    Tachycardia 11/29/2023   Tremor, essential    Wears dentures    full upper   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/02/2018    Past Surgical History:  Procedure Laterality Date   ANKLE ARTHROSCOPY Left 09/29/2019   Procedure: LEFT ANKLE ARTHROSCOPY, DEBRIDEMENT;  Surgeon: Harden Jerona GAILS, MD;  Location: [facility];  Service: Orthopedics;  Laterality: Left;   BACK SURGERY     BIOPSY  01/18/2023   Procedure: BIOPSY;  Surgeon: Onita Elspeth Sharper, DO;  Location: [facility] ENDOSCOPY;  Service: Gastroenterology;;  CATARACT EXTRACTION W/PHACO Right 03/12/2023   Procedure: CATARACT EXTRACTION PHACO AND INTRAOCULAR LENS PLACEMENT (IOC) RIGHT DIABETIC 11.25 01:07.6;  Surgeon: Jaye Fallow, MD;  Location: [facility];  Service: Ophthalmology;  Laterality: Right;   CATARACT EXTRACTION W/PHACO Left 03/25/2023   Procedure: CATARACT EXTRACTION PHACO AND INTRAOCULAR LENS PLACEMENT (IOC) LEFT DIABETIC 7.15 00:51.0;  Surgeon: Jaye Fallow, MD;  Location: [facility];  Service: Ophthalmology;  Laterality: Left;    COLONOSCOPY WITH PROPOFOL  N/A 09/14/2016   Procedure: COLONOSCOPY WITH PROPOFOL ;  Surgeon: Lamar ONEIDA Holmes, MD;  Location: [facility] ENDOSCOPY;  Service: Endoscopy;  Laterality: N/A;   esophageal stretch     ESOPHAGOGASTRODUODENOSCOPY (EGD) WITH PROPOFOL  N/A 12/13/2020   Procedure: ESOPHAGOGASTRODUODENOSCOPY (EGD) WITH PROPOFOL ;  Surgeon: Maryruth Ole ONEIDA, MD;  Location: [facility] ENDOSCOPY;  Service: Endoscopy;  Laterality: N/A;  IDDM   ESOPHAGOGASTRODUODENOSCOPY (EGD) WITH PROPOFOL  N/A 01/18/2023   Procedure: ESOPHAGOGASTRODUODENOSCOPY (EGD) WITH PROPOFOL ;  Surgeon: Onita Elspeth Sharper, DO;  Location: [facility] ENDOSCOPY;  Service: Gastroenterology;  Laterality: N/A;   JOINT REPLACEMENT Right 2016   knee   KNEE SURGERY Left 10/2023   placed a stimulator in knee then was removed several months later   LUMBAR LAMINECTOMY/DECOMPRESSION MICRODISCECTOMY Left 02/03/2016   Procedure: LEFT L5-S1 DISKECTOMY;  Surgeon: Catalina Stains, MD;  Location: [facility];  Service: Neurosurgery;  Laterality: Left;  LEFT L5-S1 DISKECTOMY   MALONEY DILATION  01/18/2023   Procedure: MALONEY DILATION;  Surgeon: Onita Elspeth Sharper, DO;  Location: [facility] ENDOSCOPY;  Service: Gastroenterology;;   PULSE GENERATOR IMPLANT N/A 08/21/2017   Procedure: UNILATERAL PULSE GENERATOR IMPLANT;  Surgeon: Clois Fret, MD;  Location: [facility] ORS;  Service: Neurosurgery;  Laterality: N/A;   PULSE GENERATOR IMPLANT Right 04/02/2018   Procedure: REMOVAL OF PULSE GENERATOR IMPLANT AND LEADS;  Surgeon: Clois Fret, MD;  Location: [facility] ORS;  Service: Neurosurgery;  Laterality: Right;   TONSILLECTOMY     TOTAL KNEE ARTHROPLASTY Right 11/15/2014   Procedure: TOTAL KNEE ARTHROPLASTY;  Surgeon: Lamar Millman, MD;  Location: [facility] OR;  Service: Orthopedics;  Laterality: Right;     reports that he has been smoking cigarettes. He started smoking about 68 years ago. He has a 34.3 pack-year smoking history. He has been exposed to tobacco smoke.  He has never used smokeless tobacco. He reports that he does not currently use alcohol . He reports that he does not use drugs.  No Known Allergies  Family History  Problem Relation Age of Onset   Heart attack Mother    Heart disease Mother    Hypertension Father    Depression Father    Kidney cancer Neg Hx    Prostate cancer Neg Hx     Prior to Admission medications   Medication Sig Start Date End Date Taking? Authorizing Provider  aspirin  EC 81 MG tablet Take 81 mg by mouth daily.    [provider]  buPROPion  (WELLBUTRIN  XL) 150 MG 24 hr tablet TAKE 1 TABLET BY MOUTH DAILY 12/26/23   Gretel App, NP  Continuous Glucose Receiver (FREESTYLE LIBRE 2 READER) DEVI Use to check glucose at least every 8 hours 04/10/23   Maribeth Camellia MATSU, MD  Continuous Glucose Sensor (FREESTYLE LIBRE 2 SENSOR) MISC Apply every 14 days to check glucose 04/10/23   Maribeth Camellia MATSU, MD  cyclobenzaprine  (FLEXERIL ) 10 MG tablet Take 1 tablet (10 mg total) by mouth 3 (three) times daily as needed for muscle spasms. 07/02/23   Gretel App, NP  dapagliflozin  propanediol (FARXIGA ) 10 MG TABS tablet  Take 10 mg by mouth daily.    [provider]  diphenoxylate -atropine  (LOMOTIL ) 2.5-0.025 MG tablet Take by mouth. 10/31/21   [provider]  divalproex  (DEPAKOTE  ER) 500 MG 24 hr tablet Take 1 tablet by mouth 2 (two) times daily. 03/20/18   [provider]  donepezil  (ARICEPT ) 10 MG tablet Take 10 mg by mouth at bedtime.    [provider]  DULoxetine  (CYMBALTA ) 30 MG capsule TAKE 2 CAPSULES BY MOUTH DAILY 12/26/23   Gretel App, NP  fenofibrate  (TRICOR ) 145 MG tablet Take 1 tablet (145 mg total) by mouth daily. 12/10/23   Gretel App, NP  gabapentin  (NEURONTIN ) 600 MG tablet TAKE TWO TABLETS BY MOUTH THREE TIMES A DAY 11/02/19   Maribeth Camellia MATSU, MD  lidocaine  (LIDODERM ) 5 % Place 1 patch onto the skin every 12 (twelve) hours. Remove & Discard patch within 12 hours or as  directed by MD Patient not taking: Reported on 01/01/2024 08/16/22   Alvia Selinda PARAS, MD  linagliptin  (TRADJENTA ) 5 MG TABS tablet Take 1 tablet (5 mg total) by mouth daily. 11/27/23   Gretel App, NP  lisinopril -hydrochlorothiazide  (ZESTORETIC ) 20-25 MG tablet Take 1 tablet by mouth 2 (two) times daily. 08/19/23   Gretel App, NP  memantine  (NAMENDA ) 10 MG tablet Take 1 tablet by mouth daily. 10/18/20   [provider]  metFORMIN  (GLUCOPHAGE -XR) 500 MG 24 hr tablet Take 1 tablet (500 mg total) by mouth 2 (two) times daily. 12/26/23   Gretel App, NP  metoprolol  succinate (TOPROL -XL) 25 MG 24 hr tablet Take 0.5 tablets (12.5 mg total) by mouth daily. 09/30/23   Gretel App, NP  Multiple Vitamins-Minerals (CENTRUM SILVER PO) Take 1 tablet by mouth daily.    [provider]  nystatin  (MYCOSTATIN ) 100000 UNIT/ML suspension Take 5 mLs (500,000 Units total) by mouth 4 (four) times daily. Swish in the mouth and retain for as long as possible (several minutes) before swallowing. Use for 7-14 days. Patient taking differently: Take 5 mLs by mouth 4 (four) times daily as needed. Swish in the mouth and retain for as long as possible (several minutes) before swallowing. Use for 7-14 days. 09/04/23   Gretel App, NP  omega-3 acid ethyl esters (LOVAZA ) 1 g capsule TAKE 2 CAPSULES BY MOUTH TWICE A DAY 11/07/22   Maribeth Camellia MATSU, MD  omeprazole  (PRILOSEC ) 20 MG capsule Take 20 mg by mouth daily. 04/01/16   [provider]  oxyCODONE -acetaminophen  (PERCOCET) 10-325 MG tablet Take 1 tablet by mouth every 6 (six) hours as needed for pain. 12/02/23 01/01/24  Patel, Seema K, NP  pantoprazole  (PROTONIX ) 40 MG tablet Take 40 mg by mouth daily. 07/14/22   [provider]  pramipexole  (MIRAPEX ) 0.125 MG tablet TAKE 1 TABLET BY MOUTH EVERY NIGHT 2-3 HOURS BEFORE BEDTIME 04/09/23   Maribeth Camellia MATSU, MD  primidone  (MYSOLINE ) 50 MG tablet Take 150-250 mg by mouth 2 (two) times daily. Take 250 mg  by mouth in the morning & take 150 mg at night.    [provider]  QUEtiapine  (SEROQUEL ) 25 MG tablet Take 75 mg by mouth at bedtime.    [provider]  rosuvastatin  (CRESTOR ) 40 MG tablet Take 1 tablet (40 mg total) by mouth daily. 08/26/23   Gretel App, NP  tamsulosin  (FLOMAX ) 0.4 MG CAPS capsule TAKE 2 CAPSULES BY MOUTH DAILY AFTER SUPPER 07/23/22   Maribeth Camellia MATSU, MD  traZODone  (DESYREL ) 100 MG tablet Take 2 tablets (200 mg total) by mouth at  bedtime as needed for sleep. 08/29/23 02/25/24  Vickey Mettle, MD  Trospium  Chloride 60 MG CP24 Take 1 capsule (60 mg total) by mouth daily. 04/02/23   Francisca Redell BROCKS, MD  vitamin C  (ASCORBIC ACID ) 500 MG tablet Take 1,000 mg by mouth daily. Patient taking differently: Take 1,000 mg by mouth daily as needed.    [provider]    Physical Exam: Vitals:   01/01/24 1251 01/01/24 1254  BP:  133/77  Pulse:  79  Resp:  15  Temp:  98 F (36.7 C)  SpO2:  97%  Weight: 78.9 kg   Height: 5' 10 (1.778 m)     Physical Exam   Constitutional: Alert, awake, calm, comfortable HEENT: Neck supple Respiratory: Clear to auscultation B/L, no wheezing, no rales.  Cardiovascular: Regular rate and rhythm, no murmurs / rubs / gallops. No extremity edema. 2+ pedal pulses. No carotid bruits.  Abdomen: Soft, no tenderness, Bowel sounds positive.  Musculoskeletal: no clubbing / cyanosis. Good ROM, no contractures. Normal muscle tone.  Skin: Different stages bruises in his body in multiple locations Neurologic: CN 2-12 grossly intact. Sensation intact, No focal deficit identified Psychiatric: Alert and oriented x 3. Normal mood.    Labs on Admission: I have personally reviewed following labs and imaging studies  CBC: Recent Labs  Lab 01/01/24 1303  WBC 5.9  HGB 10.6*  HCT 32.4*  MCV 97.0  PLT 258   Basic Metabolic Panel: Recent Labs  Lab 01/01/24 1303  NA 137  K 4.5  CL 98  CO2 28  GLUCOSE 99  BUN 28*  CREATININE  1.44*  CALCIUM  9.4   GFR: Estimated Creatinine Clearance: 40.1 mL/min (A) (by C-G formula based on SCr of 1.44 mg/dL (H)). Liver Function Tests: Recent Labs  Lab 01/01/24 1303  AST 31  ALT 18  ALKPHOS 51  BILITOT 0.6  PROT 6.5  ALBUMIN 3.5   No results for input(s): LIPASE, AMYLASE in the last 168 hours. No results for input(s): AMMONIA in the last 168 hours. Coagulation Profile: No results for input(s): INR, PROTIME in the last 168 hours. Cardiac Enzymes: Recent Labs  Lab 01/01/24 1303  TROPONINIHS 8   BNP (last 3 results) No results for input(s): BNP in the last 8760 hours. HbA1C: No results for input(s): HGBA1C in the last 72 hours. CBG: No results for input(s): GLUCAP in the last 168 hours. Lipid Profile: No results for input(s): CHOL, HDL, LDLCALC, TRIG, CHOLHDL, LDLDIRECT in the last 72 hours. Thyroid  Function Tests: Recent Labs    01/01/24 1404  TSH 1.416   Anemia Panel: No results for input(s): VITAMINB12, FOLATE, FERRITIN, TIBC, IRON, RETICCTPCT in the last 72 hours. Urine analysis:    Component Value Date/Time   COLORURINE YELLOW (A) 01/01/2024 1404   APPEARANCEUR CLEAR (A) 01/01/2024 1404   APPEARANCEUR Clear 07/14/2020 1322   LABSPEC 1.010 01/01/2024 1404   PHURINE 6.0 01/01/2024 1404   GLUCOSEU NEGATIVE 01/01/2024 1404   HGBUR NEGATIVE 01/01/2024 1404   BILIRUBINUR NEGATIVE 01/01/2024 1404   BILIRUBINUR Negative 07/14/2020 1322   KETONESUR NEGATIVE 01/01/2024 1404   PROTEINUR NEGATIVE 01/01/2024 1404   UROBILINOGEN 0.2 07/20/2019 1608   NITRITE NEGATIVE 01/01/2024 1404   LEUKOCYTESUR NEGATIVE 01/01/2024 1404    Radiological Exams on Admission: I have personally reviewed images No results found.  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  1.  Recurrent falls at home - There are multiple reasons for him to have falls including but not limited to restless leg syndrome, chronic dizziness, chronic intermittent bradycardia and type II Mobitz 1 block. - He will be placed in observation - Cardiology consult has been called and evaluated patient and appreciate their consult - Monitoring telemetry - CT head is pending - His TSH is normal at 1.416, will check B12, folate and magnesium  level - Will give him physical and Occupational Therapy evaluation  2.  History of Mobitz type I block - Will hold beta-blocker metoprolol  - Will monitor in telemetry - Will check magnesium  level - Will follow cardiology recommendation, may need Holter monitoring at discharge  3.  HTN/HLD - Resume home medications  4.  Restless leg syndrome - Supportive care - Resume home medication    DVT prophylaxis: SQ Heparin  Code Status: Full Code Family Communication: None available Disposition Plan: Home Consults called: Cardiology Admission status: Observation, Telemetry bed   Nena Rebel, MD [facility] 01/01/2024, 3:56 PM

## 2024-01-01 NOTE — Assessment & Plan Note (Addendum)
 He has experienced multiple falls with worsening balance and dizziness. Recent scans showed no abnormalities, and there has been no recent neurology consultation. In setting of abnormal EKG, advised ED for further evaluation.

## 2024-01-01 NOTE — Patient Instructions (Signed)
 [name] , Thank you for taking time out of your busy schedule to complete your Annual Wellness Visit with me. I enjoyed our conversation and look forward to speaking with you again next year. I, as well as your care team,  appreciate your ongoing commitment to your health goals. Please review the following plan we discussed and let me know if I can assist you in the future. Your Game plan/ To Do List    Referrals: If you haven't heard from the office you've been referred to, please reach out to them at the phone provided.   Remember to update you flu vaccine annually  Follow up Visits: We will see or speak with you next year for your Next Medicare AWV with our clinical staff 01/01/25 @ 10:50 Have you seen your provider in the last 6 months (3 months if uncontrolled diabetes)? Yes  Clinician Recommendations:  Aim for 30 minutes of exercise or brisk walking, 6-8 glasses of water, and 5 servings of fruits and vegetables each day.       This is a list of the screenings recommended for you:  Health Maintenance  Topic Date Due   Yearly kidney health urinalysis for diabetes  Never done   COVID-19 Vaccine (5 - 2024-25 season) 01/20/2023   Eye exam for diabetics  12/24/2023   Flu Shot  08/18/2024*   Complete foot exam   02/27/2024   Hemoglobin A1C  04/01/2024   Yearly kidney function blood test for diabetes  11/29/2024   Medicare Annual Wellness Visit  12/31/2024   DTaP/Tdap/Td vaccine (4 - Td or Tdap) 11/05/2033   Pneumococcal Vaccine for age over 24  Completed   Zoster (Shingles) Vaccine  Completed   Hepatitis B Vaccine  Aged Out   HPV Vaccine  Aged Out   Meningitis B Vaccine  Aged Out  *Topic was postponed. The date shown is not the original due date.    Advanced directives: (In Chart) A copy of your advanced directives are scanned into your chart should your provider ever need it. Advance Care Planning is important because it:  [x]  Makes sure you receive the medical care that is  consistent with your values, goals, and preferences  [x]  It provides guidance to your family and loved ones and reduces their decisional burden about whether or not they are making the right decisions based on your wishes.  Follow the link provided in your after visit summary or read over the paperwork we have mailed to you to help you started getting your Advance Directives in place. If you need assistance in completing these, please reach out to us  so that we can help you!  See attachments for Preventive Care and Fall Prevention Tips.    Managing Pain Without Opioids Opioids are strong medicines used to treat moderate to severe pain. For some people, especially those who have long-term (chronic) pain, opioids may not be the best choice for pain management due to: Side effects like nausea, constipation, and sleepiness. The risk of addiction (opioid use disorder). The longer you take opioids, the greater your risk of addiction. Pain that lasts for more than 3 months is called chronic pain. Managing chronic pain usually requires more than one approach and is often provided by a team of health care providers working together (multidisciplinary approach). Pain management may be done at a pain management center or pain clinic. How to manage pain without the use of opioids Use non-opioid medicines Non-opioid medicines for pain may include: Over-the-counter  or prescription non-steroidal anti-inflammatory drugs (NSAIDs). These may be the first medicines used for pain. They work well for muscle and bone pain, and they reduce swelling. Acetaminophen . This over-the-counter medicine may work well for milder pain but not swelling. Antidepressants. These may be used to treat chronic pain. A certain type of antidepressant (tricyclics) is often used. These medicines are given in lower doses for pain than when used for depression. Anticonvulsants. These are usually used to treat seizures but may also reduce nerve  (neuropathic) pain. Muscle relaxants. These relieve pain caused by sudden muscle tightening (spasms). You may also use a pain medicine that is applied to the skin as a patch, cream, or gel (topical analgesic), such as a numbing medicine. These may cause fewer side effects than medicines taken by mouth. Do certain therapies as directed Some therapies can help with pain management. They include: Physical therapy. You will do exercises to gain strength and flexibility. A physical therapist may teach you exercises to move and stretch parts of your body that are weak, stiff, or painful. You can learn these exercises at physical therapy visits and practice them at home. Physical therapy may also involve: Massage. Heat wraps or applying heat or cold to affected areas. Electrical signals that interrupt pain signals (transcutaneous electrical nerve stimulation, TENS). Weak lasers that reduce pain and swelling (low-level laser therapy). Signals from your body that help you learn to regulate pain (biofeedback). Occupational therapy. This helps you to learn ways to function at home and work with less pain. Recreational therapy. This involves trying new activities or hobbies, such as a physical activity or drawing. Mental health therapy, including: Cognitive behavioral therapy (CBT). This helps you learn coping skills for dealing with pain. Acceptance and commitment therapy (ACT) to change the way you think and react to pain. Relaxation therapies, including muscle relaxation exercises and mindfulness-based stress reduction. Pain management counseling. This may be individual, family, or group counseling.  Receive medical treatments Medical treatments for pain management include: Nerve block injections. These may include a pain blocker and anti-inflammatory medicines. You may have injections: Near the spine to relieve chronic back or neck pain. Into joints to relieve back or joint pain. Into nerve areas  that supply a painful area to relieve body pain. Into muscles (trigger point injections) to relieve some painful muscle conditions. A medical device placed near your spine to help block pain signals and relieve nerve pain or chronic back pain (spinal cord stimulation device). Acupuncture. Follow these instructions at home Medicines Take over-the-counter and prescription medicines only as told by your health care provider. If you are taking pain medicine, ask your health care providers about possible side effects to watch out for. Do not drive or use heavy machinery while taking prescription opioid pain medicine. Lifestyle  Do not use drugs or alcohol  to reduce pain. If you drink alcohol , limit how much you have to: 0-1 drink a day for women who are not pregnant. 0-2 drinks a day for men. Know how much alcohol  is in a drink. In the [place] one drink equals one 12 oz bottle of beer (355 mL), one 5 oz glass of wine (148 mL), or one 1 oz glass of hard liquor (44 mL). Do not use any products that contain nicotine  or tobacco. These products include cigarettes, chewing tobacco, and vaping devices, such as e-cigarettes. If you need help quitting, ask your health care provider. Eat a healthy diet and maintain a healthy weight. Poor diet and excess weight may  make pain worse. Eat foods that are high in fiber. These include fresh fruits and vegetables, whole grains, and beans. Limit foods that are high in fat and processed sugars, such as fried and sweet foods. Exercise regularly. Exercise lowers stress and may help relieve pain. Ask your health care provider what activities and exercises are safe for you. If your health care provider approves, join an exercise class that combines movement and stress reduction. Examples include yoga and tai chi. Get enough sleep. Lack of sleep may make pain worse. Lower stress as much as possible. Practice stress reduction techniques as told by your therapist. General  instructions Work with all your pain management providers to find the treatments that work best for you. You are an important member of your pain management team. There are many things you can do to reduce pain on your own. Consider joining an online or in-person support group for people who have chronic pain. Keep all follow-up visits. This is important. Where to find more information You can find more information about managing pain without opioids from: American Academy of Pain Medicine: painmed.org Institute for Chronic Pain: instituteforchronicpain.org American Chronic Pain Association: theacpa.org Contact a health care provider if: You have side effects from pain medicine. Your pain gets worse or does not get better with treatments or home therapy. You are struggling with anxiety or depression. Summary Many types of pain can be managed without opioids. Chronic pain may respond better to pain management without opioids. Pain is best managed when you and a team of health care providers work together. Pain management without opioids may include non-opioid medicines, medical treatments, physical therapy, mental health therapy, and lifestyle changes. Tell your health care providers if your pain gets worse or is not being managed well enough. This information is not intended to replace advice given to you by your health care provider. Make sure you discuss any questions you have with your health care provider. Document Revised: 08/17/2020 Document Reviewed: 08/17/2020 Elsevier Patient Education  2024 [employer].

## 2024-01-02 ENCOUNTER — Other Ambulatory Visit: Payer: Self-pay | Admitting: Nurse Practitioner

## 2024-01-02 DIAGNOSIS — B370 Candidal stomatitis: Secondary | ICD-10-CM

## 2024-01-02 DIAGNOSIS — R001 Bradycardia, unspecified: Secondary | ICD-10-CM | POA: Diagnosis not present

## 2024-01-02 LAB — CBC
HCT: 34.9 % — ABNORMAL LOW (ref 39.0–52.0)
Hemoglobin: 11.5 g/dL — ABNORMAL LOW (ref 13.0–17.0)
MCH: 31.9 pg (ref 26.0–34.0)
MCHC: 33 g/dL (ref 30.0–36.0)
MCV: 96.9 fL (ref 80.0–100.0)
Platelets: 292 K/uL (ref 150–400)
RBC: 3.6 MIL/uL — ABNORMAL LOW (ref 4.22–5.81)
RDW: 14.8 % (ref 11.5–15.5)
WBC: 5.8 K/uL (ref 4.0–10.5)
nRBC: 0 % (ref 0.0–0.2)

## 2024-01-02 LAB — COMPREHENSIVE METABOLIC PANEL WITH GFR
ALT: 18 U/L (ref 0–44)
AST: 35 U/L (ref 15–41)
Albumin: 3.6 g/dL (ref 3.5–5.0)
Alkaline Phosphatase: 45 U/L (ref 38–126)
Anion gap: 11 (ref 5–15)
BUN: 26 mg/dL — ABNORMAL HIGH (ref 8–23)
CO2: 27 mmol/L (ref 22–32)
Calcium: 9.7 mg/dL (ref 8.9–10.3)
Chloride: 99 mmol/L (ref 98–111)
Creatinine, Ser: 1.27 mg/dL — ABNORMAL HIGH (ref 0.61–1.24)
GFR, Estimated: 56 mL/min — ABNORMAL LOW (ref >60)
Glucose, Bld: 141 mg/dL — ABNORMAL HIGH (ref 70–99)
Potassium: 4.3 mmol/L (ref 3.5–5.1)
Sodium: 137 mmol/L (ref 135–145)
Total Bilirubin: 0.5 mg/dL (ref 0.0–1.2)
Total Protein: 6.8 g/dL (ref 6.5–8.1)

## 2024-01-02 LAB — PROTIME-INR
INR: 1.1 (ref 0.8–1.2)
Prothrombin Time: 14.4 s (ref 11.4–15.2)

## 2024-01-02 LAB — MAGNESIUM: Magnesium: 1.5 mg/dL — ABNORMAL LOW (ref 1.7–2.4)

## 2024-01-02 MED ORDER — DIVALPROEX SODIUM ER 250 MG PO TB24
500.0000 mg | ORAL_TABLET | Freq: Two times a day (BID) | ORAL | Status: DC
  Administered 2024-01-02: 500 mg via ORAL
  Filled 2024-01-02: qty 2

## 2024-01-02 MED ORDER — ASPIRIN 81 MG PO TBEC
81.0000 mg | DELAYED_RELEASE_TABLET | Freq: Every day | ORAL | Status: DC
  Administered 2024-01-02: 81 mg via ORAL
  Filled 2024-01-02: qty 1

## 2024-01-02 MED ORDER — DONEPEZIL HCL 5 MG PO TABS: 10.0000 mg | ORAL_TABLET | Freq: Every day | ORAL | Status: DC

## 2024-01-02 MED ORDER — METOPROLOL SUCCINATE ER 25 MG PO TB24
25.0000 mg | ORAL_TABLET | Freq: Every day | ORAL | Status: DC
  Administered 2024-01-02: 25 mg via ORAL
  Filled 2024-01-02: qty 1

## 2024-01-02 MED ORDER — OXYCODONE HCL 5 MG PO TABS: 5.0000 mg | ORAL_TABLET | Freq: Four times a day (QID) | ORAL | Status: DC | PRN

## 2024-01-02 MED ORDER — OXYCODONE-ACETAMINOPHEN 10-325 MG PO TABS: 1.0000 | ORAL_TABLET | Freq: Four times a day (QID) | ORAL | Status: DC | PRN

## 2024-01-02 MED ORDER — OXYCODONE-ACETAMINOPHEN 5-325 MG PO TABS
1.0000 | ORAL_TABLET | Freq: Four times a day (QID) | ORAL | Status: DC | PRN
  Administered 2024-01-02: 1 via ORAL
  Filled 2024-01-02: qty 1

## 2024-01-02 MED ORDER — METOPROLOL SUCCINATE ER 25 MG PO TB24: 25.0000 mg | ORAL_TABLET | Freq: Every day | ORAL | 3 refills | Status: AC

## 2024-01-02 MED ORDER — LISINOPRIL 10 MG PO TABS
20.0000 mg | ORAL_TABLET | Freq: Two times a day (BID) | ORAL | Status: DC
  Administered 2024-01-02: 20 mg via ORAL
  Filled 2024-01-02: qty 2

## 2024-01-02 MED ORDER — DULOXETINE HCL 60 MG PO CPEP
60.0000 mg | ORAL_CAPSULE | Freq: Every day | ORAL | Status: DC
  Administered 2024-01-02: 60 mg via ORAL
  Filled 2024-01-02: qty 1

## 2024-01-02 MED ORDER — BUPROPION HCL ER (XL) 150 MG PO TB24
150.0000 mg | ORAL_TABLET | Freq: Every day | ORAL | Status: DC
  Administered 2024-01-02: 150 mg via ORAL
  Filled 2024-01-02: qty 1

## 2024-01-02 MED ORDER — DAPAGLIFLOZIN PROPANEDIOL 10 MG PO TABS
10.0000 mg | ORAL_TABLET | Freq: Every day | ORAL | Status: DC
  Administered 2024-01-02: 10 mg via ORAL
  Filled 2024-01-02: qty 1

## 2024-01-02 MED ORDER — HYDROCHLOROTHIAZIDE 25 MG PO TABS
25.0000 mg | ORAL_TABLET | Freq: Two times a day (BID) | ORAL | Status: DC
  Administered 2024-01-02: 25 mg via ORAL
  Filled 2024-01-02: qty 1

## 2024-01-02 MED ORDER — ROSUVASTATIN CALCIUM 20 MG PO TABS
40.0000 mg | ORAL_TABLET | Freq: Every day | ORAL | Status: DC
  Administered 2024-01-02: 40 mg via ORAL
  Filled 2024-01-02: qty 2

## 2024-01-02 MED ORDER — GABAPENTIN 100 MG PO CAPS
600.0000 mg | ORAL_CAPSULE | Freq: Three times a day (TID) | ORAL | Status: DC
  Administered 2024-01-02: 600 mg via ORAL
  Filled 2024-01-02: qty 6

## 2024-01-02 MED ORDER — LISINOPRIL-HYDROCHLOROTHIAZIDE 20-25 MG PO TABS: 1.0000 | ORAL_TABLET | Freq: Two times a day (BID) | ORAL | Status: DC

## 2024-01-02 MED ORDER — FENOFIBRATE 160 MG PO TABS
160.0000 mg | ORAL_TABLET | Freq: Every day | ORAL | Status: DC
  Administered 2024-01-02: 160 mg via ORAL
  Filled 2024-01-02: qty 1

## 2024-01-02 NOTE — ED Notes (Addendum)
 Pt requesting home medications. This nurse messaged Dr. Paudel about pt medications.

## 2024-01-02 NOTE — Progress Notes (Signed)
 [facility] CARDIOLOGY PROGRESS NOTE       Patient ID: James Hood MRN: [id number] DOB/AGE: 1941-03-05 83 y.o.  Admit date: 01/01/2024 Referring Physician Dr. Jacolyn Primary Physician Gretel App, NP Primary Cardiologist None Reason for Consultation Abnormal EKG  HPI: James Hood is a 83 y.o. 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 Patient states he's had issues with recurrent falls for past 2-3 years and it's always associated with loss of balance. Arrived to ED as falls have been more frequent, fell 4x this week. Patient denies any chest pain, SOB, palpitations, syncope or lightheadedness. Reports intermittent dizziness when he lays down or gets up for the past week after he hit his head 1 week ago. EKG and telemetry revealed second degree AVB, Mobitz 1. Cardiology was consulted for further evaluation.   Interval History: -Patient seen and examined this AM and laying comfortably in hospital bed. Patient states he feels good and denies lightheadedness/dizziness, chest pain, SOB, palpitations. -Patients BP elevated and HR stable this AM.  Per telemetry with ventricular bigeminy with mobitz 1, rates 90s.  No evidence of significant bradycardia, pauses or high-grade AV block. -Patient remains on room air with stable SpO2.  -Patient ambulated today and states he felt good with no concerns.   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   Colon polyps    Complication of anesthesia    Woke during first cataract procedure   Dementia (HCC)    Depression    Diabetes mellitus type 2 in  nonobese (HCC)    GERD (gastroesophageal reflux disease)    BARRETTS ESOPHAGUS RESOLVED PER PATIENT   Headache    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HCC)    History of hiatal hernia    Hypercholesteremia    Hypertension    Hyperthyroidism    Intractable nausea and vomiting 11/29/2023   Intractable nausea and vomiting 11/29/2023   Nausea & vomiting 11/30/2023   Neuropathic pain    New onset headache 09/09/2015   Primary localized osteoarthritis of right knee    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   Stomach ulcer    Tachycardia 11/29/2023   Tremor, essential    Wears dentures    full upper   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/02/2018    Past Surgical History:  Procedure Laterality Date   ANKLE ARTHROSCOPY Left 09/29/2019   Procedure: LEFT ANKLE ARTHROSCOPY, DEBRIDEMENT;  Surgeon: Harden Jerona GAILS, MD;  Location: [facility];  Service: Orthopedics;  Laterality: Left;   BACK SURGERY     BIOPSY  01/18/2023   Procedure: BIOPSY;  Surgeon: Onita Elspeth Sharper, DO;  Location: [facility] ENDOSCOPY;  Service: Gastroenterology;;   CATARACT EXTRACTION W/PHACO Right 03/12/2023   Procedure: CATARACT EXTRACTION PHACO AND INTRAOCULAR LENS PLACEMENT (IOC) RIGHT DIABETIC 11.25 01:07.6;  Surgeon: Jaye Fallow, MD;  Location: [facility];  Service: Ophthalmology;  Laterality: Right;   CATARACT EXTRACTION W/PHACO Left 03/25/2023   Procedure: CATARACT EXTRACTION PHACO AND INTRAOCULAR LENS PLACEMENT (IOC) LEFT DIABETIC 7.15 00:51.0;  Surgeon: Jaye Fallow, MD;  Location: [facility];  Service: Ophthalmology;  Laterality: Left;   COLONOSCOPY WITH PROPOFOL  N/A 09/14/2016   Procedure: COLONOSCOPY WITH PROPOFOL ;  Surgeon: Lamar ONEIDA Holmes, MD;  Location: [facility] END[facility]COPY;  Service: Endoscopy;  Laterality: N/A;   esophageal stretch     ESOPHAGOGASTRODUODENOSCOPY (EGD) WITH PROPOFOL  N/A 12/13/2020   Procedure: ESOPHAGOGASTRODUODENOSCOPY (EGD) WITH PROPOFOL ;   Surgeon: Maryruth Ole ONEIDA, MD;  Location: [facility];  Service: Endoscopy;  Laterality: N/A;  IDDM   ESOPHAGOGASTRODUODENOSCOPY (EGD) WITH PROPOFOL  N/A 01/18/2023   Procedure: ESOPHAGOGASTRODUODENOSCOPY (EGD) WITH PROPOFOL ;  Surgeon: Onita Elspeth Sharper, DO;  Location: [facility];  Service: Gastroenterology;  Laterality: N/A;   JOINT REPLACEMENT Right 2016   knee   KNEE SURGERY Left 10/2023   placed a stimulator in knee then was removed several months later   LUMBAR LAMINECTOMY/DECOMPRESSION MICRODISCECTOMY Left 02/03/2016   Procedure: LEFT L5-S1 DISKECTOMY;  Surgeon: Catalina Stains, MD;  Location: [facility];  Service: Neurosurgery;  Laterality: Left;  LEFT L5-S1 DISKECTOMY   MALONEY DILATION  01/18/2023   Procedure: MALONEY DILATION;  Surgeon: Onita Elspeth Sharper, DO;  Location: [facility];  Service: Gastroenterology;;   PULSE GENERATOR IMPLANT N/A 08/21/2017   Procedure: UNILATERAL PULSE GENERATOR IMPLANT;  Surgeon: Clois Fret, MD;  Location: [facility];  Service: Neurosurgery;  Laterality: N/A;   PULSE GENERATOR IMPLANT Right 04/02/2018   Procedure: REMOVAL OF PULSE GENERATOR IMPLANT AND LEADS;  Surgeon: Clois Fret, MD;  Location: [facility];  Service: Neurosurgery;  Laterality: Right;   TONSILLECTOMY     TOTAL KNEE ARTHROPLASTY Right 11/15/2014   Procedure: TOTAL KNEE ARTHROPLASTY;  Surgeon: Lamar Millman, MD;  Location: [facility];  Service: Orthopedics;  Laterality: Right;    (Not in a hospital admission)  Social History   Socioeconomic History   Marital status: Married    Spouse name: Not on file   Number of children: Not on file   Years of education: Not on file   Highest education level: Master's degree (e.g., MA, MS, MEng, MEd, MSW, MBA)  Occupational History   Not on file  Tobacco Use   Smoking status: Every Day    Current packs/day: 0.50    Average packs/day: 0.5 packs/day for 68.6 years (34.3 ttl pk-yrs)    Types: Cigarettes    Start date:  15    Passive exposure: Current   Smokeless tobacco: Never  Vaping Use   Vaping status: Former  Substance and Sexual Activity   Alcohol  use: Not Currently    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   Drug use: Never   Sexual activity: Not Currently  Other Topics Concern   Not on file  Social History Narrative   married   Social Drivers of Health   Financial Resource Strain: Medium Risk (12/31/2023)   Overall Financial Resource Strain (CARDIA)    Difficulty of Paying Living Expenses: Somewhat hard  Food Insecurity: No Food Insecurity (12/31/2023)   Hunger Vital Sign    Worried About Running Out of Food in the Last Year: Never true    Ran Out of Food in the Last Year: Never true  Transportation Needs: No Transportation Needs (12/31/2023)   PRAPARE - Administrator, Civil Service (Medical): No    Lack of Transportation (Non-Medical): No  Physical Activity: Insufficiently Active (12/31/2023)   Exercise Vital Sign    Days of Exercise per Week: 2 days    Minutes of Exercise per Session: 30 min  Stress: No Stress Concern Present (12/31/2023)   [employer] of Occupational Health -  Occupational Stress Questionnaire    Feeling of Stress: Only a little  Social Connections: Socially Integrated (12/31/2023)   Social Connection and Isolation Panel    Frequency of Communication with Friends and Family: More than three times a week    Frequency of Social Gatherings with Friends and Family: Three times a week    Attends Religious Services: More than 4 times per year    Active Member of Clubs or Organizations: Yes    Attends Banker Meetings: More than 4 times per year    Marital Status: Married  Catering manager Violence: Not At Risk (01/01/2024)   Humiliation, Afraid, Rape, and Kick questionnaire    Fear of Current or Ex-Partner: No    Emotionally Abused: No    Physically Abused: No    Sexually Abused: No    Family History  Problem Relation Age of Onset    Heart attack Mother    Heart disease Mother    Hypertension Father    Depression Father    Kidney cancer Neg Hx    Prostate cancer Neg Hx      Vitals:   01/02/24 0800 01/02/24 0830 01/02/24 0900 01/02/24 0930  BP: (!) 160/84 (!) 125/93 (!) 133/102 (!) 142/68  Pulse: (!) 52 (!) 48 (!) 50 (!) 52  Resp: (!) 23 14 17 17   Temp: 98 F (36.7 C)     TempSrc: Oral     SpO2: 96% 97% 96% 98%  Weight:      Height:        PHYSICAL EXAM General: Well appearing elderly male, well nourished, in no acute distress. HEENT: Normocephalic and atraumatic. Neck: No JVD.   Lungs: Normal respiratory effort on room air. Clear bilaterally to auscultation. No wheezes, crackles, rhonchi.  Heart: HRRR. Normal S1 and S2 without gallops or murmurs.  Abdomen: Non-distended appearing.  Msk: Normal strength and tone for age. Extremities: Warm and well perfused. No clubbing, cyanosis, edema.  Neuro: Alert and oriented X 3. Psych: Answers questions appropriately.   Labs: Basic Metabolic Panel: Recent Labs    01/01/24 1303 01/01/24 1759 01/02/24 0626  NA 137  --  137  K 4.5  --  4.3  CL 98  --  99  CO2 28  --  27  GLUCOSE 99  --  141*  BUN 28*  --  26*  CREATININE 1.44* 1.32* 1.27*  CALCIUM  9.4  --  9.7  MG  --  1.6* 1.5*   Liver Function Tests: Recent Labs    01/01/24 1303 01/02/24 0626  AST 31 35  ALT 18 18  ALKPHOS 51 45  BILITOT 0.6 0.5  PROT 6.5 6.8  ALBUMIN 3.5 3.6   No results for input(s): LIPASE, AMYLASE in the last 72 hours. CBC: Recent Labs    01/01/24 1759 01/02/24 0626  WBC 6.5 5.8  HGB 10.5* 11.5*  HCT 31.7* 34.9*  MCV 96.1 96.9  PLT 254 292   Cardiac Enzymes: Recent Labs    01/01/24 1303  TROPONINIHS 8   BNP: No results for input(s): BNP in the last 72 hours. D-Dimer: No results for input(s): DDIMER in the last 72 hours. Hemoglobin A1C: No results for input(s): HGBA1C in the last 72 hours. Fasting Lipid Panel: No results for input(s):  CHOL, HDL, LDLCALC, TRIG, CHOLHDL, LDLDIRECT in the last 72 hours. Thyroid  Function Tests: Recent Labs    01/01/24 1404  TSH 1.416   Anemia Panel: Recent Labs    01/01/24 1759  VITAMINB12 433  FOLATE 26.0     Radiology: CT Head Wo Contrast Result Date: 01/01/2024 CLINICAL DATA:  Weakness. EXAM: CT HEAD WITHOUT CONTRAST TECHNIQUE: Contiguous axial images were obtained from the base of the skull through the vertex without intravenous contrast. RADIATION DOSE REDUCTION: This exam was performed according to the departmental dose-optimization program which includes automated exposure control, adjustment of the mA and/or kV according to patient size and/or use of iterative reconstruction technique. COMPARISON:  November 29, 2023 FINDINGS: Brain: There is generalized cerebral atrophy with widening of the extra-axial spaces and ventricular dilatation. There are areas of decreased attenuation within the white matter tracts of the supratentorial brain, consistent with microvascular disease changes. Vascular: No hyperdense vessel or unexpected calcification. Skull: Normal. Negative for fracture or focal lesion. Sinuses/Orbits: No acute finding. Other: None. IMPRESSION: 1. Generalized cerebral atrophy and microvascular disease changes of the supratentorial brain. 2. No acute intracranial abnormality. Electronically Signed   By: Suzen Dials M.D.   On: 01/01/2024 17:01    ECHO ordered (scheduled for outpatient on 08/19 at 1PM)  TELEMETRY reviewed by me 01/02/2024: second degree AVB mobitz 1 with frequent PVCs, rate 90s  EKG reviewed by me: second degree AVB, mobitz 1 rate 78 bpm   Data reviewed by me 01/02/2024: last 24h vitals tele labs imaging I/O hospitalist progress notes.  Principal Problem:   Bradycardia Active Problems:   Hypertension   Primary localized osteoarthritis of right knee   Falls   Type 2 diabetes mellitus (HCC)   HLD (hyperlipidemia)   Tobacco abuse   RLS  (restless legs syndrome)    ASSESSMENT AND PLAN:   James Hood is a 83 y.o. 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 Patient states he's had issues with recurrent falls for past 2-3 years and it's always associated with loss of balance. Arrived to ED as falls have been more frequent, fell 4x this week. Patient denies any chest pain, SOB, palpitations, syncope or lightheadedness. Reports intermittent dizziness when he lays down or gets up for the past week after he hit his head 1 week ago. EKG and telemetry revealed second degree AVB, Mobitz 1. Cardiology was consulted for further evaluation.   # Second degree AVB, mobitz 1  # Ventricular bigeminy Patient has known hx of mobitz 1, visuible EKG with this rhythm on 03/14/2023. EKG in ED with second degree AVB, mobitz 1 rate 78 bpm. Per tele with rates 70-90s. Evidence of ventricular bigeminy.  No evidence of significant pauses or bradycardia. Patient remains aSX.  -Monitor tele closely for any significant pauses or bradycardia.  -Resumed home metoprolol  succinate 25 mg daily for frequent PVCs.  -Avoided amiodarone due to history of prolonged QT syndrome. -Placed 72-hour cardiac monitor to further evaluate arrhythmias.  # Recurrent Falls # Balance issues -Management per primary team.  # Hypertension -Resume home antihypertensive medications.   Patient ambulated today and feels good. Eager to go home. Cardiac monitor placed. Ok for discharge today from a cardiac perspective.  Follow-up with Dr. Wilburn on 08/21 at 10 AM. Echo scheduled for 08/19 at 1PM.   This patient's plan of care was discussed and created with Dr. Ammon and he is in agreement.  Signed: Dorene Comfort, PA-C  01/02/2024, 9:59 AM [facility] Cardiology

## 2024-01-02 NOTE — Evaluation (Signed)
 Occupational Therapy Evaluation Patient Details Name: OHM DENTLER MRN: [id number] DOB: [birth year] Today's Date: 01/02/2024   History of Present Illness   James Hood is a pleasant 83 y.o. male with medical history significant for HTN, history of second-degree AV block (Mobitz type I ) since 2024, recurrent falls/balance issues, type 2 diabetes, RLS who presented to ED at [facility] on 01/01/2024 for recurrent falls at home.  Patient stated that he has issues with recurrent falls for the last 2 to 3 years and most of the time it is associated with loss of balance.  Patient came to ED with more than 4 falls in the last week with multiple bruises in his body.  Patient reports intermittent dizziness when he tried to get up.  In the emergency room EKG showed second-degree AV block type I.     Clinical Impressions Pt was seen for OT evaluation this date. Prior to hospital admission, pt was MODI, amb with SPC PRN. Pt lives with his wife in a camper with 3 steps to enter with railing on both sides. Pt presents with deficits in fall prevention and impaired balance, affecting safe and optimal ADL completion. Pt donned bilateral socks in sitting with MODI. Pt currently requires supervision for STS from multiple surfaces for safety, MODI for LB dressing, +1 HHA for amb within the room as pt declined use of SPC for short distances. Pt educated on fall prevention techniques, home modifications and DME for use at D/C for optimized safety once returned home. Pt would benefit from skilled OT services to address noted impairments and functional limitations (see below for any additional details) in order to maximize safety and independence while minimizing falls risk and caregiver burden. Do not anticipate the need for follow up OT services upon acute hospital DC.      If plan is discharge home, recommend the following:   A little help with walking and/or transfers;A little help with  bathing/dressing/bathroom     Functional Status Assessment   Patient has had a recent decline in their functional status and demonstrates the ability to make significant improvements in function in a reasonable and predictable amount of time.     Equipment Recommendations   Other (comment) (Rolling walker)     Recommendations for Other Services         Precautions/Restrictions   Precautions Precautions: Fall Recall of Precautions/Restrictions: Intact Restrictions Weight Bearing Restrictions Per Provider Order: No     Mobility Bed Mobility Overal bed mobility: Independent             General bed mobility comments: No physical assistance to come to the EOB    Transfers Overall transfer level: Needs assistance Equipment used: 1 person hand held assist Transfers: Sit to/from Stand Sit to Stand: Supervision           General transfer comment: Supervision for STS from the EOB for safety      Balance Overall balance assessment: History of Falls, Needs assistance Sitting-balance support: Feet unsupported, No upper extremity supported Sitting balance-Leahy Scale: Good Sitting balance - Comments: Steady reaching outside BOS during LB dressing   Standing balance support: Single extremity supported, During functional activity Standing balance-Leahy Scale: Fair Standing balance comment: Declined use of SPC for short amb within room, +1 HHA utilized for dynamic standing, no UE support during static standing  ADL either performed or assessed with clinical judgement   ADL Overall ADL's : Needs assistance/impaired Eating/Feeding: Independent;Sitting                   Lower Body Dressing: Modified independent;Sit to/from stand   Toilet Transfer: Minimal assistance;Ambulation Toilet Transfer Details (indicate cue type and reason): 1 HHA         Functional mobility during ADLs: Minimal assistance (+1 HHA) General  ADL Comments: MODI-indep for seated ADLs, +1 HHA for toilet transfer                                Pertinent Vitals/Pain Pain Assessment Pain Assessment: 0-10 Pain Score: 6  Pain Location: L ankle Pain Descriptors / Indicators: Aching Pain Intervention(s): Limited activity within patient's tolerance, Monitored during session     Extremity/Trunk Assessment Upper Extremity Assessment Upper Extremity Assessment: Overall WFL for tasks assessed   Lower Extremity Assessment Lower Extremity Assessment: Defer to PT evaluation   Cervical / Trunk Assessment Cervical / Trunk Assessment: Normal   Communication Communication Communication: No apparent difficulties   Cognition Arousal: Alert Behavior During Therapy: WFL for tasks assessed/performed Cognition: No apparent impairments             OT - Cognition Comments: A/Ox4                 Following commands: Intact       Cueing  General Comments   Cueing Techniques: Verbal cues      Exercises Exercises: Other exercises Other Exercises Other Exercises: Edu: role of OT eval, safe ADL completion, benefits of DME, home modifications, fall prevention   Shoulder Instructions      Home Living Family/patient expects to be discharged to:: Private residence Living Arrangements: Spouse/significant other Available Help at Discharge: Family;Available 24 hours/day Type of Home: Mobile home Home Access: Stairs to enter Entrance Stairs-Number of Steps: 3 Entrance Stairs-Rails: Can reach both;Left;Right Home Layout: One level     Bathroom Shower/Tub: Estate manager/land agent Accessibility: No   Home Equipment: Cane - single point;Rollator (4 wheels)          Prior Functioning/Environment Prior Level of Function : Independent/Modified Independent;History of Falls (last six months);Driving             Mobility Comments: SPC at baseline ADLs Comments: Indep in all ADl/IADLs    OT Problem  List: Decreased strength;Decreased activity tolerance;Impaired balance (sitting and/or standing);Decreased coordination;Decreased safety awareness;Decreased knowledge of use of DME or AE   OT Treatment/Interventions: Self-care/ADL training;Therapeutic exercise;DME and/or AE instruction;Energy conservation;Therapeutic activities;Balance training      OT Goals(Current goals can be found in the care plan section)   Acute Rehab OT Goals Patient Stated Goal: Have less falls OT Goal Formulation: With patient Time For Goal Achievement: 01/16/24 Potential to Achieve Goals: Good   OT Frequency:  Min 1X/week                  AM-PAC OT 6 Clicks Daily Activity     Outcome Measure Help from another person eating meals?: None Help from another person taking care of personal grooming?: None Help from another person toileting, which includes using toliet, bedpan, or urinal?: None Help from another person bathing (including washing, rinsing, drying)?: A Little Help from another person to put on and taking off regular upper body clothing?: None Help from another person to put on and taking  off regular lower body clothing?: None 6 Click Score: 23   End of Session Equipment Utilized During Treatment: Gait belt Nurse Communication: Mobility status  Activity Tolerance: Patient tolerated treatment well Patient left: in bed;with call bell/phone within reach  OT Visit Diagnosis: Unsteadiness on feet (R26.81);Other abnormalities of gait and mobility (R26.89);Repeated falls (R29.6);Muscle weakness (generalized) (M62.81)                Time: 9051-8998 OT Time Calculation (min): 13 min Charges:  OT General Charges $OT Visit: 1 Visit OT Evaluation $OT Eval Low Complexity: 1 Low  Larraine Colas M.S. OTR/L  01/02/24, 12:14 PM

## 2024-01-02 NOTE — ED Notes (Signed)
 Messaged Dr. Dorinda about ordering pt home medications.

## 2024-01-02 NOTE — Discharge Summary (Signed)
 Physician Discharge Summary   Patient: James Hood MRN: [id number] DOB: 01/11/1941  Admit date:     01/01/2024  Discharge date: 01/02/24  Discharge Physician: Drue ONEIDA Potter   PCP: Gretel App, NP   Recommendations at discharge:  Follow-up with cardiology  Discharge Diagnoses: Recurrent falls at home History of Mobitz type I block HTN/HLD Restless leg syndrome  Hospital Course: James Hood is a pleasant 83 y.o. male with medical history significant for HTN, history of second-degree AV block (Mobitz type I ) since 2024, recurrent falls/balance issues, type 2 diabetes, RLS who presented to ED at [facility] on 01/01/2024 for recurrent falls at home.  Patient stated that he has issues with recurrent falls for the last 2 to 3 years and most of the time it is associated with loss of balance.  Patient came to ED with more than 4 falls in the last week with multiple bruises in his body.  Patient denied any fever, chest pain, shortness of breath, palpitations, syncope or lightheadedness.  Patient reports intermittent dizziness when he tried to get up.  In the emergency room EKG showed second-degree AV block type I.  Cardiology was consulted from ED for evaluation. Patient remained asymptomatic during hospitalization and has been cleared by cardiologist with Holter monitor in place.  Patient will follow-up with cardiology as an outpatient with close monitoring.    Consultants: Psychiatry Procedures performed: None Disposition: Home Diet recommendation:  Cardiac diet DISCHARGE MEDICATION: Allergies as of 01/02/2024   No Known Allergies      Medication List     STOP taking these medications    clopidogrel 75 MG tablet Commonly known as: PLAVIX   lidocaine  5 % Commonly known as: Lidoderm    omeprazole  20 MG capsule Commonly known as: PRILOSEC    oxyCODONE -acetaminophen  10-325 MG tablet Commonly known as: PERCOCET       TAKE these medications     ascorbic acid  500 MG tablet Commonly known as: VITAMIN C  Take 1,000 mg by mouth daily. What changed:  when to take this reasons to take this   aspirin  EC 81 MG tablet Take 81 mg by mouth daily.   buPROPion  150 MG 24 hr tablet Commonly known as: WELLBUTRIN  XL TAKE 1 TABLET BY MOUTH DAILY   CENTRUM SILVER PO Take 1 tablet by mouth daily.   cyclobenzaprine  10 MG tablet Commonly known as: FLEXERIL  Take 1 tablet (10 mg total) by mouth 3 (three) times daily as needed for muscle spasms.   diphenoxylate -atropine  2.5-0.025 MG tablet Commonly known as: LOMOTIL  Take by mouth.   divalproex  500 MG 24 hr tablet Commonly known as: DEPAKOTE  ER Take 1 tablet by mouth 2 (two) times daily.   donepezil  10 MG tablet Commonly known as: ARICEPT  Take 10 mg by mouth at bedtime.   DULoxetine  30 MG capsule Commonly known as: CYMBALTA  TAKE 2 CAPSULES BY MOUTH DAILY   Farxiga  10 MG Tabs tablet Generic drug: dapagliflozin  propanediol Take 10 mg by mouth daily.   fenofibrate  145 MG tablet Commonly known as: TRICOR  Take 1 tablet (145 mg total) by mouth daily.   FreeStyle Libre 2 Reader [employer] Use to check glucose at least every 8 hours   FreeStyle Libre 2 Sensor Misc Apply every 14 days to check glucose   gabapentin  600 MG tablet Commonly known as: NEURONTIN  TAKE TWO TABLETS BY MOUTH THREE TIMES A DAY   linagliptin  5 MG Tabs tablet Commonly known as: Tradjenta  Take 1 tablet (5 mg total) by mouth  daily.   lisinopril -hydrochlorothiazide  20-25 MG tablet Commonly known as: ZESTORETIC  Take 1 tablet by mouth 2 (two) times daily.   memantine  10 MG tablet Commonly known as: NAMENDA  Take 1 tablet by mouth daily.   metFORMIN  500 MG 24 hr tablet Commonly known as: GLUCOPHAGE -XR Take 1 tablet (500 mg total) by mouth 2 (two) times daily.   metoprolol  succinate 25 MG 24 hr tablet Commonly known as: TOPROL -XL Take 1 tablet (25 mg total) by mouth daily. What changed: how much to take    nystatin  100000 UNIT/ML suspension Commonly known as: MYCOSTATIN  Take 5 mLs (500,000 Units total) by mouth 4 (four) times daily. Swish in the mouth and retain for as long as possible (several minutes) before swallowing. Use for 7-14 days. What changed:  when to take this reasons to take this   omega-3 acid ethyl esters 1 g capsule Commonly known as: LOVAZA  TAKE 2 CAPSULES BY MOUTH TWICE A DAY   pantoprazole  40 MG tablet Commonly known as: PROTONIX  Take 40 mg by mouth daily.   pramipexole  0.125 MG tablet Commonly known as: MIRAPEX  TAKE 1 TABLET BY MOUTH EVERY NIGHT 2-3 HOURS BEFORE BEDTIME   primidone  50 MG tablet Commonly known as: MYSOLINE  Take 150-250 mg by mouth 2 (two) times daily. Take 250 mg by mouth in the morning & take 150 mg at night.   QUEtiapine  25 MG tablet Commonly known as: SEROQUEL  Take 75 mg by mouth at bedtime.   rosuvastatin  40 MG tablet Commonly known as: CRESTOR  Take 1 tablet (40 mg total) by mouth daily.   tamsulosin  0.4 MG Caps capsule Commonly known as: FLOMAX  TAKE 2 CAPSULES BY MOUTH DAILY AFTER SUPPER   traZODone  100 MG tablet Commonly known as: DESYREL  Take 200 mg by mouth at bedtime.   Trospium  Chloride 60 MG Cp24 Take 1 capsule (60 mg total) by mouth daily.               Durable Medical Equipment  (From admission, onward)           Start     Ordered   01/02/24 1328  For home use only DME Walker rolling  Once       Question Answer Comment  Walker: With 5 Inch Wheels   Patient needs a walker to treat with the following condition Ambulatory dysfunction      01/02/24 1327            Follow-up Information     Alluri, Keller BROCKS, MD. Go on 01/09/2024.   Specialty: Cardiology Why: 08/21 at 10 AM. Contact information: [address] [place] [address] [phone]                Discharge Exam: Fredricka Weights   01/01/24 1251  Weight: 78.9 kg   Constitutional: Alert, awake, calm,  comfortable HEENT: Neck supple Respiratory: Clear to auscultation B/L Cardiovascular: Regular rate and rhythm, no murmurs / rubs / gallops. No extremity edema.  Abdomen: Soft, no tenderness, Bowel sounds positive.  Musculoskeletal: no clubbing / cyanosis. Good ROM, no contractures. Normal muscle tone.  Skin: Different stages bruises in his body in multiple locations Neurologic: CN 2-12 grossly intact. Sensation intact, No focal deficit identified Psychiatric: Alert and oriented x 3. Normal mood.    Condition at discharge: good  The results of significant diagnostics from this hospitalization (including imaging, microbiology, ancillary and laboratory) are listed below for reference.   Imaging Studies: CT Head Wo Contrast Result Date: 01/01/2024 CLINICAL DATA:  Weakness.  EXAM: CT HEAD WITHOUT CONTRAST TECHNIQUE: Contiguous axial images were obtained from the base of the skull through the vertex without intravenous contrast. RADIATION DOSE REDUCTION: This exam was performed according to the departmental dose-optimization program which includes automated exposure control, adjustment of the mA and/or kV according to patient size and/or use of iterative reconstruction technique. COMPARISON:  November 29, 2023 FINDINGS: Brain: There is generalized cerebral atrophy with widening of the extra-axial spaces and ventricular dilatation. There are areas of decreased attenuation within the white matter tracts of the supratentorial brain, consistent with microvascular disease changes. Vascular: No hyperdense vessel or unexpected calcification. Skull: Normal. Negative for fracture or focal lesion. Sinuses/Orbits: No acute finding. Other: None. IMPRESSION: 1. Generalized cerebral atrophy and microvascular disease changes of the supratentorial brain. 2. No acute intracranial abnormality. Electronically Signed   By: Suzen Dials M.D.   On: 01/01/2024 17:01    Microbiology: Results for orders placed or performed in  visit on 07/14/20  Microscopic Examination     Status: None   Collection Time: 07/14/20  1:22 PM   Urine  Result Value Ref Range Status   WBC, UA 0-5 0 - 5 /hpf Final   RBC, Urine 0-2 0 - 2 /hpf Final   Epithelial Cells (non renal) None seen 0 - 10 /hpf Final   Bacteria, UA None seen None seen/Few Final   *Note: Due to a large number of results and/or encounters for the requested time period, some results have not been displayed. A complete set of results can be found in Results Review.    Labs: CBC: Recent Labs  Lab 01/01/24 1303 01/01/24 1759 01/02/24 0626  WBC 5.9 6.5 5.8  HGB 10.6* 10.5* 11.5*  HCT 32.4* 31.7* 34.9*  MCV 97.0 96.1 96.9  PLT 258 254 292   Basic Metabolic Panel: Recent Labs  Lab 01/01/24 1303 01/01/24 1759 01/02/24 0626  NA 137  --  137  K 4.5  --  4.3  CL 98  --  99  CO2 28  --  27  GLUCOSE 99  --  141*  BUN 28*  --  26*  CREATININE 1.44* 1.32* 1.27*  CALCIUM  9.4  --  9.7  MG  --  1.6* 1.5*   Liver Function Tests: Recent Labs  Lab 01/01/24 1303 01/02/24 0626  AST 31 35  ALT 18 18  ALKPHOS 51 45  BILITOT 0.6 0.5  PROT 6.5 6.8  ALBUMIN 3.5 3.6   CBG: No results for input(s): GLUCAP in the last 168 hours.  Discharge time spent:  37 minutes.  Signed: Drue ONEIDA Potter, MD [facility] 01/02/2024

## 2024-01-02 NOTE — Evaluation (Signed)
 Physical Therapy Evaluation Patient Details Name: James Hood MRN: [id number] DOB: 10-16-1940 Today's Date: 01/02/2024  History of Present Illness  Pt is an 83 y.o. male with medical history significant for HTN, history of second-degree AV block (Mobitz type I ) since 2024, recurrent falls/balance issues, type 2 diabetes, RLS who presented to ED at [facility] on 01/01/2024 for recurrent falls at home.  Patient stated that he has issues with recurrent falls for the last 2 to 3 years and most of the time it is associated with loss of balance.  MD assessment includes: recurrent falls at home with multiple reasons for him to have falls including but not limited to restless leg syndrome, chronic dizziness, chronic intermittent bradycardia and type II Mobitz 1 block.   Clinical Impression  Pt was pleasant and motivated to participate during the session and put forth good effort throughout. Pt required no physical assistance during the session and was generally steady with good control and stability with standing activities with a RW.  Pt was able to amb 175 feet with the walker with no adverse symptoms and only required cuing for upright posture and amb closer to the RW.  Pt has an extensive history of recent falls with pt reporting most falls occurring outdoors with [facility] use.  Pt steady with the RW with pt advised to utilize the walker at discharge.  Pt reported no adverse symptoms during the session with SpO2 and HR WNL throughout on room air.  Pt will benefit from continued PT services upon discharge to safely address deficits listed in patient problem list for decreased caregiver assistance and eventual return to PLOF.           If plan is discharge home, recommend the following: A little help with walking and/or transfers;Assist for transportation;Help with stairs or ramp for entrance   Can travel by private vehicle        Equipment Recommendations Rolling walker (2  wheels)  Recommendations for Other Services       Functional Status Assessment Patient has had a recent decline in their functional status and demonstrates the ability to make significant improvements in function in a reasonable and predictable amount of time.     Precautions / Restrictions Precautions Precautions: Fall Recall of Precautions/Restrictions: Intact Restrictions Weight Bearing Restrictions Per Provider Order: No      Mobility  Bed Mobility Overal bed mobility: Independent             General bed mobility comments: Good speed and control with bed mobility tasks    Transfers Overall transfer level: Needs assistance Equipment used: Rolling walker (2 wheels) Transfers: Sit to/from Stand Sit to Stand: Supervision           General transfer comment: Good eccentric and concentric control and stability    Ambulation/Gait Ambulation/Gait assistance: Supervision Gait Distance (Feet): 175 Feet Assistive device: Rolling walker (2 wheels) Gait Pattern/deviations: Step-through pattern, Decreased step length - right, Decreased step length - left, Trunk flexed Gait velocity: decreased     General Gait Details: Mildly decreased cadence with mod verbal cues for amb closer to the RW with upright posture but pt steady with no overt LOB throughout  Stairs            Wheelchair Mobility     Tilt Bed    Modified Rankin (Stroke Patients Only)       Balance Overall balance assessment: History of Falls, Needs assistance   Sitting balance-Leahy Scale: Normal  Standing balance support: During functional activity, Bilateral upper extremity supported Standing balance-Leahy Scale: Fair                               Pertinent Vitals/Pain Pain Assessment Pain Assessment: 0-10 Pain Score: 5  Pain Location: L ankle, chronic per patient Pain Descriptors / Indicators: Aching, Sore Pain Intervention(s): Premedicated before session, Monitored  during session    Home Living Family/patient expects to be discharged to:: Private residence Living Arrangements: Spouse/significant other Available Help at Discharge: Family;Available 24 hours/day Type of Home: Mobile home (camper) Home Access: Stairs to enter Entrance Stairs-Rails: Can reach both;Left;Right Entrance Stairs-Number of Steps: 3   Home Layout: One level Home Equipment: Cane - single point;Rollator (4 wheels) Additional Comments: rollator is an upright walker with BUE platforms, bulky and difficult to negotiate/travel with per patient    Prior Function Prior Level of Function : Independent/Modified Independent;History of Falls (last six months);Driving             Mobility Comments: Mod ind amb in the camper with furniture cruising, SPC outdoors, 6 falls in the last 6 months all due to LOB with 4 of the falls in the last week; per patient almost all falls occure outside of the home ADLs Comments: Indep with all ADLs     Extremity/Trunk Assessment   Upper Extremity Assessment Upper Extremity Assessment: Overall WFL for tasks assessed    Lower Extremity Assessment Lower Extremity Assessment: Overall WFL for tasks assessed    Cervical / Trunk Assessment Cervical / Trunk Assessment: Normal  Communication   Communication Communication: No apparent difficulties    Cognition Arousal: Alert Behavior During Therapy: WFL for tasks assessed/performed   PT - Cognitive impairments: No apparent impairments                         Following commands: Intact       Cueing Cueing Techniques: Verbal cues     General Comments      Exercises     Assessment/Plan    PT Assessment Patient needs continued PT services  PT Problem List Decreased balance;Decreased knowledge of use of DME       PT Treatment Interventions DME instruction;Gait training;Stair training;Functional mobility training;Therapeutic activities;Therapeutic exercise;Balance  training;Patient/family education    PT Goals (Current goals can be found in the Care Plan section)  Acute Rehab PT Goals Patient Stated Goal: To get stronger, less falls, and start going to the gym PT Goal Formulation: With patient Time For Goal Achievement: 01/15/24 Potential to Achieve Goals: Good    Frequency Min 2X/week     Co-evaluation               AM-PAC PT 6 Clicks Mobility  Outcome Measure Help needed turning from your back to your side while in a flat bed without using bedrails?: None Help needed moving from lying on your back to sitting on the side of a flat bed without using bedrails?: None Help needed moving to and from a bed to a chair (including a wheelchair)?: A Little Help needed standing up from a chair using your arms (e.g., wheelchair or bedside chair)?: A Little Help needed to walk in hospital room?: A Little Help needed climbing 3-5 steps with a railing? : A Little 6 Click Score: 20    End of Session Equipment Utilized During Treatment: Gait belt Activity Tolerance: Patient tolerated treatment well  Patient left: in bed;with call bell/phone within reach Nurse Communication: Mobility status PT Visit Diagnosis: Unsteadiness on feet (R26.81);History of falling (Z91.81);Difficulty in walking, not elsewhere classified (R26.2)    Time: 9140-9075 PT Time Calculation (min) (ACUTE ONLY): 25 min   Charges:   PT Evaluation $PT Eval Moderate Complexity: 1 Mod   PT General Charges $$ ACUTE PT VISIT: 1 Visit    D. Scott Shawny Borkowski PT, DPT 01/02/24, 11:28 AM

## 2024-01-16 NOTE — Progress Notes (Signed)
 New Patient Visit    Chief Complaint: Mobitz 1 AV block Chief Complaint  Patient presents with  . New Patient    HOSP. Follow-up   Date of Service: 01/16/2024 Date of Birth: Mar 06, 1941 PCP: Maribeth Camellia Molly, MD  History of Present Illness: James Hood is a 83 y.o.male patient who presented for Mobitz 1 AV block  Past medical history significant for second-degree Mobitz 1 AV block, frequent PVCs, hypertension, diabetes, recurrent falls/balance issues.  Echocardiogram 12/2023 with normal biventricular systolic function, LVEF 55% with mild MR/TR.  He has history of chronic falls/balance issues, recently was in hospital for fall without syncope.  Intermittent second-degree Mobitz 1 AV block along with PVCs noted.  Discharged on low-dose metoprolol .  Today in detail that he have a lot of balance issues, uses cane and walker.  No complaint of chest pain/pressure, palpitation, shortness of breath, dizziness or syncope.  Past Medical and Surgical History  Past Medical History Past Medical History:  Diagnosis Date  . Anemia   . Barrett's esophagus determined by biopsy    Last EGD, 06/22/13, Barrett's Esophagus with RF ablation treatment, Dr Terrie, [facility].  . Chronic diarrhea   . Colon polyp 12/01/13   tubular adenoma  . Depression   . Diabetes mellitus type 2, uncomplicated (CMS/HHS-HCC)    Last Hemoglobin A1C = 6.4, MA = 5, 06/05/13.  Last eye check, 03/27/13. No retinopathy (Dr. Dingeldein).  SABRA GERD (gastroesophageal reflux disease)   . Hemorrhoids   . Hyperlipidemia   . Hypotestosteronism   . Metatarsal fracture 04/2011   left 5th  . Peptic ulcer    treated in the 1980s  . Personal history of alcoholism (CMS/HHS-HCC)    no alcohol  since 1987  . Reflux   . RLS (restless legs syndrome)     Past Surgical History He has a past surgical history that includes pharyngeal papillectomy (11/2005); Colonoscopy (04/15/2002, 02/22/2000); Colonoscopy (05/26/2009, 10/22/2005); Colonoscopy  (12/01/2013, 09/11/2010); esophageal ablation; throat surgery (2007, 2014, 2015); spinal injections; egd (05/26/2009, 10/22/2005, 02/22/2000); egd (05/02/2012); egd (08/31/2013, 06/22/2013, 12/22/2012, 10/06/2012, 07/21/2012); egd (11/11/2013, 04/27/2014, 05/24/2015); Colonoscopy (09/14/2016); lumbar decompression surgery (2016); Spinal cord stimulator placement (08/21/2017); Removal of spinal cord stimulator due to infection (04/02/2018); egd (12/13/2020); EGD @ [facility] (01/18/2023); and Sciatic nerve stimulator (09/2023).   Medications and Allergies  Current Medications Current Outpatient Medications  Medication Sig Dispense Refill  . aspirin  81 MG EC tablet Take 81 mg by mouth daily.    . buPROPion  (WELLBUTRIN  SR) 150 MG SR tablet TAKE 1 TABLET (150 MG TOTAL) BY MOUTH 2 (TWO) TIMES DAILY. 180 tablet 1  . cyclobenzaprine  (FLEXERIL ) 10 MG tablet Take 10 mg by mouth 3 (three) times daily as needed    . diphenoxylate -atropine  (LOMOTIL ) 2.5-0.025 mg tablet Take 1 tablet by mouth 2 (two) times daily as needed for Diarrhea 180 tablet 0  . divalproex  (DEPAKOTE  ER) 500 MG ER tablet Take 1 tablet (500 mg total) by mouth 2 (two) times daily 180 tablet 1  . donepeziL  (ARICEPT ) 10 MG tablet Take 1 tablet (10 mg total) by mouth at bedtime 90 tablet 1  . DULoxetine  (CYMBALTA ) 30 MG DR capsule Take 30 mg by mouth 2 (two) times daily    . empagliflozin  (JARDIANCE ) 10 mg tablet Take 10 mg by mouth daily with breakfast    . fenofibrate  nanocrystallized (TRICOR ) 145 MG tablet TAKE 1 TABLET (145 MG TOTAL) BY MOUTH ONCE DAILY. 30 tablet 4  . finasteride  (PROSCAR ) 5 mg tablet Take 5 mg by mouth  once daily.    . gabapentin  (NEURONTIN ) 600 MG tablet Take 2 tablets (1,200 mg total) by mouth 3 (three) times daily 540 tablet 1  . glimepiride  (AMARYL ) 4 MG tablet TAKE 1 TABLET (4 MG TOTAL) BY MOUTH 2 (TWO) TIMES DAILY. 60 tablet 1  . linaGLIPtin  (TRADJENTA ) 5 mg tablet Take 5 mg by mouth once daily    .  lisinopril -hydrochlorothiazide  (PRINZIDE ,ZESTORETIC ) 20-25 mg tablet TAKE 1 TABLET BY MOUTH 2 (TWO) TIMES DAILY. 60 tablet 0  . memantine  (NAMENDA ) 10 MG tablet Take 1 tablet (10 mg total) by mouth once daily 90 tablet 1  . metFORMIN  (GLUCOPHAGE ) 500 MG tablet TAKE 2 TABLETS (1,000 MG TOTAL) BY MOUTH 2 (TWO) TIMES DAILY. 120 tablet 4  . metoprolol  succinate (TOPROL -XL) 25 MG XL tablet TAKE ONE TABLET BY MOUTH DAILY 90 tablet 0  . omega-3 acid ethyl esters (LOVAZA ) 1 gram capsule Take 2 capsules by mouth 2 (two) times daily    . pantoprazole  (PROTONIX ) 40 MG DR tablet Take 1 tablet (40 mg total) by mouth once daily 30 tablet 11  . pramipexole  (MIRAPEX ) 0.125 MG tablet Take 1 tablet (0.125 mg total) by mouth at bedtime 90 tablet 1  . primidone  (MYSOLINE ) 50 MG tablet TAKE 5 TABLETS EVERY MORNING AND TAKE THREE TABLETS BY MOUTH EVERY EVENING 720 tablet 1  . QUEtiapine  (SEROQUEL ) 25 MG tablet Take 4 tablets (100 mg total) by mouth at bedtime 120 tablet 2  . rosuvastatin  (CRESTOR ) 40 MG tablet Take 40 mg by mouth once daily    . simvastatin  (ZOCOR ) 40 MG tablet Take 40 mg by mouth at bedtime    . sitaGLIPtin  (JANUVIA ) 100 MG tablet Take 100 mg by mouth once daily.    . tamsulosin  (FLOMAX ) 0.4 mg capsule Take 0.4 mg by mouth once daily       . traZODone  (DESYREL ) 150 MG tablet TAKE 1 TABLET BY MOUTH AT BEDTIME 90 tablet 1  . trospium  (SANCTURA  XR) 60 mg XR capsule Take 60 mg by mouth once daily    . acetaminophen  (TYLENOL ) 500 MG tablet Take 1,000 mg by mouth continuously as needed for Pain. (Patient not taking: Reported on 01/16/2024)    . cholestyramine (QUESTRAN) 4 gram oral powder packet Take 1 packet (4 g total) by mouth 2 (two) times daily before meals Mix dose in 60-180 mL of water, milk or juice. (Patient not taking: Reported on 10/23/2023) 60 each 12  . codeine-guaiFENesin  10-100 mg/5 mL oral liquid Take 5 mLs by mouth at bedtime as needed for Cough or Congestion (Patient not taking: Reported on  10/23/2023) 60 mL 0  . ferrous sulfate 325 (65 FE) MG tablet Take 1 tablet by mouth once daily    . gabapentin  (NEURONTIN ) 600 MG tablet Take 2 tablets (1,200 mg total) by mouth 3 (three) times daily (Patient not taking: Reported on 10/23/2023) 540 tablet 1  . HYDROcodone -acetaminophen  (NORCO) 10-325 mg tablet Take 1 tablet by mouth every 6 (six) hours as needed for Pain (Patient not taking: Reported on 01/16/2024)    . multivitamin tablet Take 1 tablet by mouth once daily (Patient not taking: Reported on 01/16/2024)    . semaglutide  (OZEMPIC ) 0.25 mg or 0.5 mg(2 mg/1.5 mL) pen injector Inject subcutaneously once a week (Patient not taking: Reported on 10/23/2023)     No current facility-administered medications for this visit.    Allergies Patient has no known allergies.  Social and Family History  Social History  reports that he has been  smoking cigarettes. He has a 32 pack-year smoking history. He has never used smokeless tobacco. He reports that he does not drink alcohol  and does not use drugs.  Family History family history includes Depression in his father; Diabetes in his son; Myocardial Infarction (Heart attack) in his father and mother; Myocardial Infarction (Heart attack) (age of onset: 35) in his son; Pacemaker in his mother.   Review of Systems   Review of Systems: The patient denies chest pain, shortness of breath, orthopnea, paroxysmal nocturnal dyspnea, pedal edema, palpitations, heart racing, presyncope, syncope.    Physical Examination   Vitals:BP 122/68 (BP Location: Left upper arm, Patient Position: Sitting, BP Cuff Size: Adult)   Pulse 86   Resp 14   Ht 177.8 cm (5' 10)   Wt 76.4 kg (168 lb 6.4 oz)   SpO2 94%   BMI 24.16 kg/m  Ht:177.8 cm (5' 10) Wt:76.4 kg (168 lb 6.4 oz) ADJ:Anib surface area is 1.94 meters squared. Body mass index is 24.16 kg/m.  HEENT: Pupils equally reactive to light and accomodation  Neck: Supple, no significant JVD Lungs: clear to  auscultation bilaterally; no wheezes, rales, rhonchi Heart: Regular rate and rhythm. No murmur Extremities: no pedal edema  Assessment and Plan   83 y.o. male with  Intermittent Mobitz 1 second-degree AV block PVCs Hypertension Diabetes type 2  Stable from cardiac standpoint without any complaints.  No history of syncope. Continue low-dose Toprol -XL to help with PVCs. Recent echo with preserved LVEF. His medication list also shows propranolol  and patient believes that he is taking it, although not on his recent discharge summary.  Will stop propranolol  considering his conduction issues. Blood pressure well-controlled, continue current antihypertensive  No orders of the defined types were placed in this encounter.   Return in about 6 months (around 07/18/2024).  KRISHNA CHAITANYA ALLURI, MD  This dictation was prepared with dragon dictation. Any transcription errors that result from this process are unintentional.

## 2024-01-17 ENCOUNTER — Other Ambulatory Visit: Payer: Self-pay | Admitting: Psychiatry

## 2024-01-21 ENCOUNTER — Telehealth: Payer: Self-pay

## 2024-01-21 NOTE — Telephone Encounter (Signed)
 [employer] chronic condition form placed in provider to be signed folder

## 2024-01-22 NOTE — Telephone Encounter (Signed)
 Form faxed to [phone] with a completed transmission log

## 2024-01-25 DIAGNOSIS — L03113 Cellulitis of right upper limb: Secondary | ICD-10-CM | POA: Diagnosis not present

## 2024-01-25 DIAGNOSIS — M7021 Olecranon bursitis, right elbow: Secondary | ICD-10-CM | POA: Diagnosis not present

## 2024-01-25 DIAGNOSIS — R21 Rash and other nonspecific skin eruption: Secondary | ICD-10-CM | POA: Diagnosis not present

## 2024-01-26 ENCOUNTER — Other Ambulatory Visit: Payer: Self-pay | Admitting: Urology

## 2024-01-27 ENCOUNTER — Other Ambulatory Visit: Payer: Self-pay | Admitting: Urology

## 2024-01-28 ENCOUNTER — Ambulatory Visit: Attending: Nurse Practitioner | Admitting: Nurse Practitioner

## 2024-01-28 ENCOUNTER — Encounter: Payer: Self-pay | Admitting: Nurse Practitioner

## 2024-01-28 VITALS — BP 134/78 | HR 83 | Temp 97.2°F | Resp 16 | Ht 70.5 in | Wt 175.0 lb

## 2024-01-28 DIAGNOSIS — Z79899 Other long term (current) drug therapy: Secondary | ICD-10-CM | POA: Diagnosis present

## 2024-01-28 DIAGNOSIS — M5417 Radiculopathy, lumbosacral region: Secondary | ICD-10-CM | POA: Diagnosis present

## 2024-01-28 DIAGNOSIS — G894 Chronic pain syndrome: Secondary | ICD-10-CM | POA: Diagnosis not present

## 2024-01-28 DIAGNOSIS — M19072 Primary osteoarthritis, left ankle and foot: Secondary | ICD-10-CM | POA: Insufficient documentation

## 2024-01-28 DIAGNOSIS — I441 Atrioventricular block, second degree: Secondary | ICD-10-CM | POA: Diagnosis not present

## 2024-01-28 DIAGNOSIS — M47816 Spondylosis without myelopathy or radiculopathy, lumbar region: Secondary | ICD-10-CM | POA: Diagnosis not present

## 2024-01-28 MED ORDER — OXYCODONE-ACETAMINOPHEN 10-325 MG PO TABS: 1.0000 | ORAL_TABLET | Freq: Four times a day (QID) | ORAL | 0 refills | Status: AC | PRN

## 2024-01-28 NOTE — Progress Notes (Signed)
 Patient was seen in ED from 01/01/24 for dehydration.  He had to have an MRI performed and they removed the popliteal peripheral nerve stimulator.  This was giving him great relief but now the left ankle pain is back.

## 2024-01-28 NOTE — Progress Notes (Signed)
 Nursing Pain Medication Assessment:  Safety precautions to be maintained throughout the outpatient stay will include: orient to surroundings, keep bed in low position, maintain call bell within reach at all times, provide assistance with transfer out of bed and ambulation.  Medication Inspection Compliance: Mr. Mulvehill did not comply with our request to bring his pills to be counted. He was reminded that bringing the medication bottles, even when empty, is a requirement.  Medication: None brought in. Pill/Patch Count: None available to be counted. Bottle Appearance: No container available. Did not bring bottle(s) to appointment. Filled Date: N/A Last Medication intake:  Ran out of medicine more than 48 hours ago

## 2024-01-28 NOTE — Progress Notes (Signed)
 PROVIDER NOTE: Interpretation of information contained herein should be left to medically-trained personnel. Specific patient instructions are provided elsewhere under Patient Instructions section of medical record. This document was created in part using AI and STT-dictation technology, any transcriptional errors that may result from this process are unintentional.  Patient: James Hood  Service: E/M   PCP: Gretel App, NP  DOB: Apr 13, 1941  DOS: 01/28/2024  Provider: Emmy MARLA Blanch, NP  MRN: [id number]  Delivery: Face-to-face  Specialty: Interventional Pain Management  Type: Established Patient  Setting: Ambulatory outpatient facility  Specialty designation: 09  Referring Prov.: Gretel App, NP  Location: Outpatient office facility       History of present illness (HPI) Mr. VITALI SEIBERT, a 83 y.o. year old male, is here today because of his Chronic pain syndrome [G89.4]. Mr. Mochizuki's primary complain today is Ankle Pain (Left )  Pertinent problems: Mr. Williamsen does not have any pertinent problems on file.  Pain Assessment: Severity of Chronic pain is reported as a 4 /10. Location: Ankle Left/up the calf. Onset: More than a month ago. Quality: Discomfort, Aching, Constant. Timing: Constant. Modifying factor(s): PNS was working great but had to removed to have an MRI. Vitals:  height is 5' 10.5 (1.791 m) and weight is 175 lb (79.4 kg). His temporal temperature is 97.2 F (36.2 C) (abnormal). His blood pressure is 134/78 and his pulse is 83. His respiration is 16 and oxygen saturation is 96%.  BMI: Estimated body mass index is 24.75 kg/m as calculated from the following:   Height as of this encounter: 5' 10.5 (1.791 m).   Weight as of this encounter: 175 lb (79.4 kg).  Last encounter: 12/25/2023. Last procedure: Visit date not found.  Reason for encounter: medication management. No change in medical history since last visit.  Patient's pain is at baseline.  Patient continues  multimodal pain regimen as prescribed.  States that it provides pain relief and improvement in functional status.  The patient was admitted to the ED due to dizziness and a fall.  They had to have MRI of the brain performed and they removed Popliteal Peripheral nerve stimulator, which had been providing significant relief; however the patient reports that the left ankle pain has returned to baseline.     Pharmacotherapy Assessment   Oxycodone -acetaminophen  (Percocet) 10-225 mg tablet every 6 hours as needed for pain.  MME equals to 60  Monitoring: [place] PMP: PDMP reviewed during this encounter.       Pharmacotherapy: No side-effects or adverse reactions reported. Compliance: No problems identified. Effectiveness: Clinically acceptable.  Jakie Chrissie MATSU, RN  01/28/2024 10:14 AM  Sign when Signing Visit Patient was seen in ED from 01/01/24 for dehydration.  He had to have an MRI performed and they removed the popliteal peripheral nerve stimulator.  This was giving him great relief but now the left ankle pain is back.   Jakie Chrissie MATSU, RN  01/28/2024 10:11 AM  Sign when Signing Visit Nursing Pain Medication Assessment:  Safety precautions to be maintained throughout the outpatient stay will include: orient to surroundings, keep bed in low position, maintain call bell within reach at all times, provide assistance with transfer out of bed and ambulation.  Medication Inspection Compliance: Mr. Shropshire did not comply with our request to bring his pills to be counted. He was reminded that bringing the medication bottles, even when empty, is a requirement.  Medication: None brought in. Pill/Patch Count: None available to be counted. Bottle Appearance: No container  available. Did not bring bottle(s) to appointment. Filled Date: N/A Last Medication intake:  Ran out of medicine more than 48 hours ago  UDS:  Summary  Date Value Ref Range Status  12/20/2022 Note  Final    Comment:     ==================================================================== [employer] Select 13 (MW) ==================================================================== Test                             Result       Flag       Units  Drug Present and Declared for Prescription Verification   Oxymorphone                    1238         EXPECTED   ng/mg creat   Noroxycodone                   2046         EXPECTED   ng/mg creat   Noroxymorphone                 575          EXPECTED   ng/mg creat    Oxymorphone, noroxycodone and noroxymorphone are expected    metabolites of oxycodone . Noroxymorphone is an expected metabolite    of oxymorphone. Sources of oxycodone  and/or oxymorphone include    scheduled prescription medications.    Phenobarbital                  PRESENT      EXPECTED    Phenobarbital is an expected metabolite of primidone ; Phenobarbital    may also be administered as a prescription drug.  Drug Absent but Declared for Prescription Verification   Oxycodone                       Not Detected UNEXPECTED ng/mg creat    Oxycodone  is almost always present in patients taking this drug    consistently.  Absence of oxycodone  could be due to lapse of time    since the last dose or unusual pharmacokinetics (rapid metabolism).  ==================================================================== Test                      Result    Flag   Units      Ref Range   Creatinine              24               mg/dL      >=79 ==================================================================== Declared Medications:  The flagging and interpretation on this report are based on the  following declared medications.  Unexpected results may arise from  inaccuracies in the declared medications.   **Note: The testing scope of this panel includes these medications:   Oxycodone  (Percocet)  Primidone  (Mysoline )   **Note: The testing scope of this panel does not include the  following reported  medications:   Acetaminophen  (Percocet)  Aspirin   Atropine  (Lomotil )  Bupropion  (Wellbutrin  XL)  Cholestyramine (Questran)  Ciclopirox   Dapagliflozin  (Farxiga )  Diphenoxylate  (Lomotil )  Divaleproex (Depakote )  Donepezil  (Aricept )  Duloxetine  (Cymbalta )  Empagliflozin  (Jardiance )  Fenofibrate  (TriCor )  Gabapentin  (Neurontin )  Hydrochlorothiazide  (Zestoretic )  Linagliptin  (Tradjenta )  Lisinopril  (Zestoretic )  Memantine  (Namenda )  Metformin  (Glucophage )  Metoprolol  (Toprol )  Multivitamin  Omega-3 Fatty Acids  Omeprazole  (Prilosec )  Pantoprazole  (Protonix )  Pramipexole  (Mirapex )  Quetiapine  (Seroquel )  Rosuvastatin  (Crestor )  Supplement  Tamsulosin  (Flomax )  Topical Lidocaine  (Lidoderm )  Trazodone  (Desyrel )  Vitamin C  ==================================================================== For clinical consultation, please call [phone]. ====================================================================     No results found for: CBDTHCR No results found for: D8THCCBX No results found for: D9THCCBX  ROS  Constitutional: Denies any fever or chills Gastrointestinal: No reported hemesis, hematochezia, vomiting, or acute GI distress Musculoskeletal: Denies any acute onset joint swelling, redness, loss of ROM, or weakness Neurological: No reported episodes of acute onset apraxia, aphasia, dysarthria, agnosia, amnesia, paralysis, loss of coordination, or loss of consciousness  Medication Review  DULoxetine , FreeStyle Libre 2 Reader, [employer] 2 Sensor, Multiple Vitamins-Minerals, QUEtiapine , Trospium  Chloride, ascorbic acid , aspirin  EC, buPROPion , cyclobenzaprine , dapagliflozin  propanediol, diphenoxylate -atropine , divalproex , donepezil , fenofibrate , gabapentin , linagliptin , lisinopril -hydrochlorothiazide , memantine , metFORMIN , metoprolol  succinate, nystatin , omega-3 acid ethyl esters, oxyCODONE -acetaminophen , pantoprazole , pramipexole , primidone , rosuvastatin ,  tamsulosin , and traZODone   History Review  Allergy: Mr. Barna has no known allergies. Drug: Mr. Soulliere  reports no history of drug use. Alcohol :  reports that he does not currently use alcohol . Tobacco:  reports that he has been smoking cigarettes. He started smoking about 68 years ago. He has a 34.3 pack-year smoking history. He has been exposed to tobacco smoke. He has never used smokeless tobacco. Social: Mr. Chalfin  reports that he has been smoking cigarettes. He started smoking about 68 years ago. He has a 34.3 pack-year smoking history. He has been exposed to tobacco smoke. He has never used smokeless tobacco. He reports that he does not currently use alcohol . He reports that he does not use drugs. 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), Colon polyps, Complication of anesthesia, Dementia (HCC), Depression, Diabetes mellitus type 2 in nonobese (HCC), GERD (gastroesophageal reflux disease), Headache, 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HCC), History of hiatal hernia, Hypercholesteremia, Hypertension, Hyperthyroidism, Intractable nausea and vomiting (11/29/2023), Intractable nausea and vomiting (11/29/2023), Nausea & vomiting (11/30/2023), Neuropathic pain, New onset headache (09/09/2015), Primary localized osteoarthritis of right knee, 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 Surgical: Mr. 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/2014); Lumbar laminectomy/decompression microdiscectomy (Left, 02/03/2016); Colonoscopy with propofol  (N/A, 09/14/2016); Joint replacement (Right, 2016); Pulse generator implant (N/A, 08/21/2017); Back  surgery; Pulse generator implant (Right, 04/02/2018); Ankle arthroscopy (Left, 09/29/2019); Esophagogastroduodenoscopy (egd) with propofol  (N/A, 12/13/2020); Esophagogastroduodenoscopy (egd) with propofol  (N/A, 01/18/2023); biopsy (01/18/2023); maloney dilation (01/18/2023); Cataract extraction w/PHACO (Right, 03/12/2023); Cataract extraction w/PHACO (Left, 03/25/2023); and Knee surgery (Left, 10/2023). Family: family history includes Depression in his father; Heart attack in his mother; Heart disease in his mother; Hypertension in his father.  Laboratory Chemistry Profile   Renal Lab Results  Component Value Date   BUN 26 (H) 01/02/2024   CREATININE 1.27 (H) 01/02/2024   BCR 23 10/08/2022   GFR 46.03 (L) 09/30/2023   GFRAA >60 09/25/2019   GFRNONAA 56 (L) 01/02/2024    Hepatic Lab Results  Component Value Date   AST 35 01/02/2024   ALT 18 01/02/2024   ALBUMIN 3.6 01/02/2024   ALKPHOS 45 01/02/2024   AMMONIA 14 08/09/2016    Electrolytes Lab Results  Component Value Date   NA 137 01/02/2024   K 4.3 01/02/2024   CL 99 01/02/2024   CALCIUM  9.7 01/02/2024   MG 1.5 (L) 01/02/2024    Bone Lab Results  Component Value Date   VD25OH 37.09 11/10/2021    Inflammation (CRP: Acute Phase) (ESR: Chronic Phase) Lab Results  Component Value Date   ESRSEDRATE 15 07/20/2019   LATICACIDVEN 1.9 06/08/2020         Note: Above Lab results reviewed.  Recent Imaging Review  CT Head Wo Contrast CLINICAL DATA:  Weakness.  EXAM: CT HEAD WITHOUT CONTRAST  TECHNIQUE: Contiguous axial images were obtained from the base of the skull through the vertex without intravenous contrast.  RADIATION DOSE REDUCTION: This exam was performed according to the departmental dose-optimization program which includes automated exposure control, adjustment of the mA and/or kV according to patient size and/or use of iterative reconstruction technique.  COMPARISON:  November 29, 2023  FINDINGS: Brain:  There is generalized cerebral atrophy with widening of the extra-axial spaces and ventricular dilatation. There are areas of decreased attenuation within the white matter tracts of the supratentorial brain, consistent with microvascular disease changes.  Vascular: No hyperdense vessel or unexpected calcification.  Skull: Normal. Negative for fracture or focal lesion.  Sinuses/Orbits: No acute finding.  Other: None.  IMPRESSION: 1. Generalized cerebral atrophy and microvascular disease changes of the supratentorial brain. 2. No acute intracranial abnormality.  Electronically Signed   By: Suzen Dials M.D.   On: 01/01/2024 17:01 Note: Reviewed        Physical Exam  Vitals: BP 134/78 (BP Location: Right Arm, Patient Position: Sitting, Cuff Size: Normal)   Pulse 83   Temp (!) 97.2 F (36.2 C) (Temporal)   Resp 16   Ht 5' 10.5 (1.791 m)   Wt 175 lb (79.4 kg)   SpO2 96%   BMI 24.75 kg/m  BMI: Estimated body mass index is 24.75 kg/m as calculated from the following:   Height as of this encounter: 5' 10.5 (1.791 m).   Weight as of this encounter: 175 lb (79.4 kg). Ideal: Ideal body weight: 74.1 kg (163 lb 7.5 oz) Adjusted ideal body weight: 76.2 kg (168 lb 1.3 oz) General appearance: Well nourished, well developed, and well hydrated. In no apparent acute distress Mental status: Alert, oriented x 3 (person, place, & time)       Respiratory: No evidence of acute respiratory distress Eyes: PERLA   Assessment   Diagnosis Status  1. Chronic pain syndrome   2. Lumbar facet joint syndrome   3. Lumbosacral radiculopathy   4. Medication management   5. Primary osteoarthritis of left ankle    Controlled Controlled Controlled   Updated Problems: No problems updated.  Plan of Care  Problem-specific:  Assessment and Plan We will continue on current medication regimen.  Prescribing drug monitoring (PDMP) reviewed; findings consistent with the use of prescribed  medication and no evidence of narcotic misuse or abuse. Routine UDS ordered today.  No other issues or problems reported with this visit.  Schedule follow-up in 90 days for medication management.  I advised the patient to wait at least 3 weeks, as Dr. Lateef has one permanent PNS placement for other patient's and wants to assess the patient's response before considering further intervention.   Mr. ARTY LANTZY has a current medication list which includes the following long-term medication(s): bupropion , donepezil , duloxetine , fenofibrate , gabapentin , linagliptin , lisinopril -hydrochlorothiazide , memantine , metformin , metoprolol  succinate, omega-3 acid ethyl esters, pramipexole , primidone , quetiapine , rosuvastatin , and trazodone .  Pharmacotherapy (Medications Ordered): Meds ordered this encounter  Medications   oxyCODONE -acetaminophen  (PERCOCET) 10-325 MG tablet    Sig: Take 1 tablet by mouth every 6 (six) hours as needed for  pain. Must last 30 days.    Dispense:  120 tablet    Refill:  0    Chronic Pain: STOP Act (Not applicable) Fill 1 day early if closed on refill date. Avoid benzodiazepines within 8 hours of opioids   oxyCODONE -acetaminophen  (PERCOCET) 10-325 MG tablet    Sig: Take 1 tablet by mouth every 6 (six) hours as needed for pain. Must last 30 days.    Dispense:  120 tablet    Refill:  0    Chronic Pain: STOP Act (Not applicable) Fill 1 day early if closed on refill date. Avoid benzodiazepines within 8 hours of opioids   oxyCODONE -acetaminophen  (PERCOCET) 10-325 MG tablet    Sig: Take 1 tablet by mouth every 6 (six) hours as needed for pain. Must last 30 days.    Dispense:  120 tablet    Refill:  0    Chronic Pain: STOP Act (Not applicable) Fill 1 day early if closed on refill date. Avoid benzodiazepines within 8 hours of opioids   Orders:  Orders Placed This Encounter  Procedures   Tox[employer]SU[employer] Select 13 (MW), Urine    Volume: 30 ml(s). Minimum 3 ml of urine is  needed. Document temperature of fresh sample. Indications: Long term (current) use of opiate analgesic (S20.108)    Release to patient:   Immediate        Return in about 3 months (around 04/28/2024) for (F2F), (MM), Emmy Blanch NP.    Recent Visits Date Type Provider Dept  12/04/23 Procedure visit Marcelino Nurse, MD [facility]  Showing recent visits within past 90 days and meeting all other requirements Today's Visits Date Type Provider Dept  01/28/24 Office Visit Sundee Garland K, NP [facility]  Showing today's visits and meeting all other requirements Future Appointments Date Type Provider Dept  04/22/24 Appointment Hilery Wintle K, NP [facility]  Showing future appointments within next 90 days and meeting all other requirements  I discussed the assessment and treatment plan with the patient. The patient was provided an opportunity to ask questions and all were answered. The patient agreed with the plan and demonstrated an understanding of the instructions.  Patient advised to call back or seek an in-person evaluation if the symptoms or condition worsens.  Duration of encounter: 30 minutes.  Total time on encounter, as per AMA guidelines included both the face-to-face and non-face-to-face time personally spent by the physician and/or other qualified health care professional(s) on the day of the encounter (includes time in activities that require the physician or other qualified health care professional and does not include time in activities normally performed by clinical staff). Physician's time may include the following activities when performed: Preparing to see the patient (e.g., pre-charting review of records, searching for previously ordered imaging, lab work, and nerve conduction tests) Review of prior analgesic pharmacotherapies. Reviewing PMP Interpreting ordered tests (e.g., lab work, imaging, nerve conduction tests) Performing post-procedure  evaluations, including interpretation of diagnostic procedures Obtaining and/or reviewing separately obtained history Performing a medically appropriate examination and/or evaluation Counseling and educating the patient/family/caregiver Ordering medications, tests, or procedures Referring and communicating with other health care professionals (when not separately reported) Documenting clinical information in the electronic or other health record Independently interpreting results (not separately reported) and communicating results to the patient/ family/caregiver Care coordination (not separately reported)  Note by: Jansen Goodpasture K Martiza Speth, NP (TTS and AI technology used. I apologize for any typographical errors that were not detected and corrected.) Date: 01/28/2024;  Time: 11:37 AM

## 2024-01-30 ENCOUNTER — Other Ambulatory Visit: Payer: Self-pay

## 2024-01-30 DIAGNOSIS — L03113 Cellulitis of right upper limb: Secondary | ICD-10-CM | POA: Diagnosis not present

## 2024-01-30 DIAGNOSIS — R21 Rash and other nonspecific skin eruption: Secondary | ICD-10-CM | POA: Diagnosis not present

## 2024-01-30 DIAGNOSIS — E782 Mixed hyperlipidemia: Secondary | ICD-10-CM

## 2024-01-30 MED ORDER — OMEGA-3-ACID ETHYL ESTERS 1 G PO CAPS: 2.0000 | ORAL_CAPSULE | Freq: Two times a day (BID) | ORAL | 1 refills | Status: AC

## 2024-01-31 LAB — TOXASSURE SELECT 13 (MW), URINE

## 2024-02-03 ENCOUNTER — Other Ambulatory Visit: Payer: Self-pay | Admitting: Urology

## 2024-02-25 ENCOUNTER — Encounter: Payer: Self-pay | Admitting: Pharmacist

## 2024-03-09 DIAGNOSIS — R194 Change in bowel habit: Secondary | ICD-10-CM | POA: Diagnosis not present

## 2024-03-09 DIAGNOSIS — R197 Diarrhea, unspecified: Secondary | ICD-10-CM | POA: Diagnosis not present

## 2024-03-21 ENCOUNTER — Other Ambulatory Visit: Payer: Self-pay | Admitting: Nurse Practitioner

## 2024-03-21 DIAGNOSIS — F419 Anxiety disorder, unspecified: Secondary | ICD-10-CM

## 2024-04-14 ENCOUNTER — Ambulatory Visit: Payer: Self-pay

## 2024-04-14 NOTE — Telephone Encounter (Signed)
 Called wife's phone no answer and voice mail has not been set up yet.

## 2024-04-14 NOTE — Telephone Encounter (Signed)
 Noted! Thank you
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
## 2024-04-14 NOTE — Telephone Encounter (Signed)
 Left a message to call the office back regarding James Hood's message.
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
## 2024-04-14 NOTE — Telephone Encounter (Signed)
 Left another message to call the office back regarding James Hood's message.

## 2024-04-17 ENCOUNTER — Other Ambulatory Visit: Payer: Self-pay | Admitting: Nurse Practitioner

## 2024-04-17 DIAGNOSIS — M7918 Myalgia, other site: Secondary | ICD-10-CM

## 2024-04-17 DIAGNOSIS — M19072 Primary osteoarthritis, left ankle and foot: Secondary | ICD-10-CM

## 2024-04-21 ENCOUNTER — Ambulatory Visit: Admitting: Nurse Practitioner

## 2024-04-21 DIAGNOSIS — M792 Neuralgia and neuritis, unspecified: Secondary | ICD-10-CM | POA: Insufficient documentation

## 2024-04-22 ENCOUNTER — Ambulatory Visit: Admitting: Nurse Practitioner

## 2024-04-22 DIAGNOSIS — M19072 Primary osteoarthritis, left ankle and foot: Secondary | ICD-10-CM

## 2024-04-22 DIAGNOSIS — M5417 Radiculopathy, lumbosacral region: Secondary | ICD-10-CM

## 2024-04-22 DIAGNOSIS — G894 Chronic pain syndrome: Secondary | ICD-10-CM

## 2024-04-22 DIAGNOSIS — Z79899 Other long term (current) drug therapy: Secondary | ICD-10-CM

## 2024-04-22 DIAGNOSIS — Z91199 Patient's noncompliance with other medical treatment and regimen due to unspecified reason: Secondary | ICD-10-CM

## 2024-04-22 DIAGNOSIS — M47816 Spondylosis without myelopathy or radiculopathy, lumbar region: Secondary | ICD-10-CM

## 2024-04-22 DIAGNOSIS — M792 Neuralgia and neuritis, unspecified: Secondary | ICD-10-CM

## 2024-04-22 NOTE — Progress Notes (Signed)
 04/22/2024- No Show

## 2024-05-04 ENCOUNTER — Emergency Department

## 2024-05-04 ENCOUNTER — Inpatient Hospital Stay

## 2024-05-04 ENCOUNTER — Inpatient Hospital Stay
Admission: EM | Admit: 2024-05-04 | Discharge: 2024-05-07 | DRG: 091 | Disposition: A | Discharge status: Skilled Nursing Facility | Attending: Internal Medicine | Admitting: Internal Medicine

## 2024-05-04 DIAGNOSIS — E874 Mixed disorder of acid-base balance: Secondary | ICD-10-CM | POA: Diagnosis present

## 2024-05-04 DIAGNOSIS — Z794 Long term (current) use of insulin: Secondary | ICD-10-CM

## 2024-05-04 DIAGNOSIS — N400 Enlarged prostate without lower urinary tract symptoms: Secondary | ICD-10-CM | POA: Diagnosis present

## 2024-05-04 DIAGNOSIS — R058 Other specified cough: Secondary | ICD-10-CM | POA: Diagnosis present

## 2024-05-04 DIAGNOSIS — F05 Delirium due to known physiological condition: Secondary | ICD-10-CM | POA: Diagnosis present

## 2024-05-04 DIAGNOSIS — J9811 Atelectasis: Secondary | ICD-10-CM | POA: Diagnosis present

## 2024-05-04 DIAGNOSIS — E7800 Pure hypercholesterolemia, unspecified: Secondary | ICD-10-CM | POA: Diagnosis present

## 2024-05-04 DIAGNOSIS — E782 Mixed hyperlipidemia: Secondary | ICD-10-CM | POA: Diagnosis present

## 2024-05-04 DIAGNOSIS — E43 Unspecified severe protein-calorie malnutrition: Secondary | ICD-10-CM | POA: Diagnosis present

## 2024-05-04 DIAGNOSIS — N1832 Chronic kidney disease, stage 3b: Secondary | ICD-10-CM | POA: Diagnosis present

## 2024-05-04 DIAGNOSIS — G2581 Restless legs syndrome: Secondary | ICD-10-CM | POA: Diagnosis present

## 2024-05-04 DIAGNOSIS — W19XXXA Unspecified fall, initial encounter: Secondary | ICD-10-CM | POA: Diagnosis present

## 2024-05-04 DIAGNOSIS — Z7984 Long term (current) use of oral hypoglycemic drugs: Secondary | ICD-10-CM | POA: Diagnosis not present

## 2024-05-04 DIAGNOSIS — G929 Unspecified toxic encephalopathy: Principal | ICD-10-CM | POA: Diagnosis present

## 2024-05-04 DIAGNOSIS — E119 Type 2 diabetes mellitus without complications: Secondary | ICD-10-CM | POA: Diagnosis not present

## 2024-05-04 DIAGNOSIS — F32A Depression, unspecified: Secondary | ICD-10-CM | POA: Diagnosis present

## 2024-05-04 DIAGNOSIS — E1140 Type 2 diabetes mellitus with diabetic neuropathy, unspecified: Secondary | ICD-10-CM | POA: Diagnosis present

## 2024-05-04 DIAGNOSIS — M19072 Primary osteoarthritis, left ankle and foot: Secondary | ICD-10-CM

## 2024-05-04 DIAGNOSIS — G9341 Metabolic encephalopathy: Secondary | ICD-10-CM | POA: Diagnosis not present

## 2024-05-04 DIAGNOSIS — S62515A Nondisplaced fracture of proximal phalanx of left thumb, initial encounter for closed fracture: Secondary | ICD-10-CM

## 2024-05-04 DIAGNOSIS — J9612 Chronic respiratory failure with hypercapnia: Secondary | ICD-10-CM | POA: Diagnosis present

## 2024-05-04 DIAGNOSIS — R634 Abnormal weight loss: Secondary | ICD-10-CM | POA: Diagnosis not present

## 2024-05-04 DIAGNOSIS — I10 Essential (primary) hypertension: Secondary | ICD-10-CM

## 2024-05-04 DIAGNOSIS — Z860100 Personal history of colon polyps, unspecified: Secondary | ICD-10-CM

## 2024-05-04 DIAGNOSIS — Z1152 Encounter for screening for COVID-19: Secondary | ICD-10-CM | POA: Diagnosis not present

## 2024-05-04 DIAGNOSIS — R4182 Altered mental status, unspecified: Secondary | ICD-10-CM | POA: Diagnosis present

## 2024-05-04 DIAGNOSIS — Z66 Do not resuscitate: Secondary | ICD-10-CM | POA: Diagnosis present

## 2024-05-04 DIAGNOSIS — Z8249 Family history of ischemic heart disease and other diseases of the circulatory system: Secondary | ICD-10-CM

## 2024-05-04 DIAGNOSIS — S62501K Fracture of unspecified phalanx of right thumb, subsequent encounter for fracture with nonunion: Secondary | ICD-10-CM | POA: Diagnosis not present

## 2024-05-04 DIAGNOSIS — I4510 Unspecified right bundle-branch block: Secondary | ICD-10-CM | POA: Diagnosis present

## 2024-05-04 DIAGNOSIS — S62501S Fracture of unspecified phalanx of right thumb, sequela: Secondary | ICD-10-CM | POA: Diagnosis not present

## 2024-05-04 DIAGNOSIS — Z8711 Personal history of peptic ulcer disease: Secondary | ICD-10-CM

## 2024-05-04 DIAGNOSIS — E785 Hyperlipidemia, unspecified: Secondary | ICD-10-CM

## 2024-05-04 DIAGNOSIS — Z7982 Long term (current) use of aspirin: Secondary | ICD-10-CM

## 2024-05-04 DIAGNOSIS — Z6823 Body mass index (BMI) 23.0-23.9, adult: Secondary | ICD-10-CM

## 2024-05-04 DIAGNOSIS — Z96651 Presence of right artificial knee joint: Secondary | ICD-10-CM | POA: Diagnosis present

## 2024-05-04 DIAGNOSIS — F419 Anxiety disorder, unspecified: Secondary | ICD-10-CM

## 2024-05-04 DIAGNOSIS — N179 Acute kidney failure, unspecified: Secondary | ICD-10-CM

## 2024-05-04 DIAGNOSIS — R296 Repeated falls: Secondary | ICD-10-CM | POA: Diagnosis present

## 2024-05-04 DIAGNOSIS — R7989 Other specified abnormal findings of blood chemistry: Secondary | ICD-10-CM

## 2024-05-04 DIAGNOSIS — Z818 Family history of other mental and behavioral disorders: Secondary | ICD-10-CM

## 2024-05-04 DIAGNOSIS — F0393 Unspecified dementia, unspecified severity, with mood disturbance: Secondary | ICD-10-CM | POA: Diagnosis present

## 2024-05-04 DIAGNOSIS — M7918 Myalgia, other site: Secondary | ICD-10-CM

## 2024-05-04 DIAGNOSIS — G319 Degenerative disease of nervous system, unspecified: Secondary | ICD-10-CM | POA: Diagnosis present

## 2024-05-04 DIAGNOSIS — Z87891 Personal history of nicotine dependence: Secondary | ICD-10-CM

## 2024-05-04 DIAGNOSIS — F0394 Unspecified dementia, unspecified severity, with anxiety: Secondary | ICD-10-CM | POA: Diagnosis present

## 2024-05-04 DIAGNOSIS — N183 Chronic kidney disease, stage 3 unspecified: Secondary | ICD-10-CM

## 2024-05-04 DIAGNOSIS — R531 Weakness: Principal | ICD-10-CM

## 2024-05-04 DIAGNOSIS — E1122 Type 2 diabetes mellitus with diabetic chronic kidney disease: Secondary | ICD-10-CM | POA: Diagnosis present

## 2024-05-04 DIAGNOSIS — I129 Hypertensive chronic kidney disease with stage 1 through stage 4 chronic kidney disease, or unspecified chronic kidney disease: Secondary | ICD-10-CM | POA: Diagnosis present

## 2024-05-04 DIAGNOSIS — N1831 Chronic kidney disease, stage 3a: Secondary | ICD-10-CM | POA: Diagnosis present

## 2024-05-04 DIAGNOSIS — Z79899 Other long term (current) drug therapy: Secondary | ICD-10-CM

## 2024-05-04 LAB — COMPREHENSIVE METABOLIC PANEL WITH GFR
ALT: 32 U/L (ref 0–44)
AST: 70 U/L — ABNORMAL HIGH (ref 15–41)
Albumin: 3.7 g/dL (ref 3.5–5.0)
Alkaline Phosphatase: 110 U/L (ref 38–126)
Anion gap: 8 (ref 5–15)
BUN: 32 mg/dL — ABNORMAL HIGH (ref 8–23)
CO2: 32 mmol/L (ref 22–32)
Calcium: 10 mg/dL (ref 8.9–10.3)
Chloride: 95 mmol/L — ABNORMAL LOW (ref 98–111)
Creatinine, Ser: 1.54 mg/dL — ABNORMAL HIGH (ref 0.61–1.24)
GFR, Estimated: 44 mL/min — ABNORMAL LOW (ref >60)
Glucose, Bld: 138 mg/dL — ABNORMAL HIGH (ref 70–99)
Potassium: 4.6 mmol/L (ref 3.5–5.1)
Sodium: 135 mmol/L (ref 135–145)
Total Bilirubin: 0.4 mg/dL (ref 0.0–1.2)
Total Protein: 6.4 g/dL — ABNORMAL LOW (ref 6.5–8.1)

## 2024-05-04 LAB — BLOOD GAS, VENOUS
Acid-Base Excess: 10.6 mmol/L — ABNORMAL HIGH (ref 0.0–2.0)
Bicarbonate: 39 mmol/L — ABNORMAL HIGH (ref 20.0–28.0)
O2 Saturation: 70.2 %
Patient temperature: 37
pCO2, Ven: 69 mmHg — ABNORMAL HIGH (ref 44–60)
pH, Ven: 7.36 (ref 7.25–7.43)
pO2, Ven: 43 mmHg (ref 32–45)

## 2024-05-04 LAB — CBG MONITORING, ED
Glucose-Capillary: 169 mg/dL — ABNORMAL HIGH (ref 70–99)
Glucose-Capillary: 151 mg/dL — ABNORMAL HIGH (ref 70–99)
Glucose-Capillary: 128 mg/dL — ABNORMAL HIGH (ref 70–99)

## 2024-05-04 LAB — CBC WITH DIFFERENTIAL/PLATELET
Abs Immature Granulocytes: 0.02 K/uL (ref 0.00–0.07)
Basophils Absolute: 0 K/uL (ref 0.0–0.1)
Basophils Relative: 0 %
Eosinophils Absolute: 0.1 K/uL (ref 0.0–0.5)
Eosinophils Relative: 2 %
HCT: 36.1 % — ABNORMAL LOW (ref 39.0–52.0)
Hemoglobin: 11.8 g/dL — ABNORMAL LOW (ref 13.0–17.0)
Immature Granulocytes: 0 %
Lymphocytes Relative: 36 %
Lymphs Abs: 2.3 K/uL (ref 0.7–4.0)
MCH: 31.5 pg (ref 26.0–34.0)
MCHC: 32.7 g/dL (ref 30.0–36.0)
MCV: 96.3 fL (ref 80.0–100.0)
Monocytes Absolute: 0.5 K/uL (ref 0.1–1.0)
Monocytes Relative: 8 %
Neutro Abs: 3.4 K/uL (ref 1.7–7.7)
Neutrophils Relative %: 54 %
Platelets: 228 K/uL (ref 150–400)
RBC: 3.75 MIL/uL — ABNORMAL LOW (ref 4.22–5.81)
RDW: 12.8 % (ref 11.5–15.5)
WBC: 6.3 K/uL (ref 4.0–10.5)
nRBC: 0 % (ref 0.0–0.2)

## 2024-05-04 LAB — TROPONIN T, HIGH SENSITIVITY
Troponin T High Sensitivity: 45 ng/L — ABNORMAL HIGH (ref 0–19)
Troponin T High Sensitivity: 45 ng/L — ABNORMAL HIGH (ref 0–19)

## 2024-05-04 LAB — URINALYSIS, ROUTINE W REFLEX MICROSCOPIC
Bilirubin Urine: NEGATIVE
Glucose, UA: 50 mg/dL — AB
Hgb urine dipstick: NEGATIVE
Ketones, ur: NEGATIVE mg/dL
Leukocytes,Ua: NEGATIVE
Nitrite: NEGATIVE
Protein, ur: NEGATIVE mg/dL
Specific Gravity, Urine: 1.011 (ref 1.005–1.030)
pH: 6 (ref 5.0–8.0)

## 2024-05-04 LAB — COOXEMETRY PANEL
Carboxyhemoglobin: 0.9 % (ref 0.5–1.5)
Methemoglobin: <0.7 % (ref 0.0–1.5)
O2 Saturation: 75.8 %
Total hemoglobin: 11.2 g/dL — ABNORMAL LOW (ref 12.0–16.0)
Total oxygen content: 75 %

## 2024-05-04 LAB — CK: Total CK: 246 U/L (ref 49–397)

## 2024-05-04 LAB — RESPIRATORY PANEL BY PCR

## 2024-05-04 LAB — GLUCOSE, CAPILLARY: Glucose-Capillary: 120 mg/dL — ABNORMAL HIGH (ref 70–99)

## 2024-05-04 LAB — RESP PANEL BY RT-PCR (RSV, FLU A&B, COVID)  RVPGX2
Influenza A by PCR: NEGATIVE
Influenza B by PCR: NEGATIVE
Resp Syncytial Virus by PCR: NEGATIVE
SARS Coronavirus 2 by RT PCR: NEGATIVE

## 2024-05-04 LAB — ETHANOL: Alcohol, Ethyl (B): <15 mg/dL (ref <15)

## 2024-05-04 LAB — HEMOGLOBIN A1C
Hgb A1c MFr Bld: 7.5 % — ABNORMAL HIGH (ref 4.8–5.6)
Mean Plasma Glucose: 168.55 mg/dL

## 2024-05-04 LAB — AMMONIA: Ammonia: 39 umol/L — ABNORMAL HIGH (ref 9–35)

## 2024-05-04 LAB — VALPROIC ACID LEVEL: Valproic Acid Lvl: 27 ug/mL — ABNORMAL LOW (ref 50–100)

## 2024-05-04 MED ORDER — TAMSULOSIN HCL 0.4 MG PO CAPS
0.4000 mg | ORAL_CAPSULE | Freq: Every day | ORAL | Status: AC
  Administered 2024-05-04 – 2024-05-06 (×3): 0.4 mg via ORAL
  Filled 2024-05-04 (×3): qty 1

## 2024-05-04 MED ORDER — INSULIN ASPART 100 UNIT/ML IJ SOLN
0.0000 [IU] | Freq: Three times a day (TID) | INTRAMUSCULAR | Status: DC
  Administered 2024-05-04: 13:00:00 2 [IU] via SUBCUTANEOUS
  Administered 2024-05-04: 17:00:00 1 [IU] via SUBCUTANEOUS
  Administered 2024-05-04 – 2024-05-05 (×2): 3 [IU] via SUBCUTANEOUS
  Administered 2024-05-05 – 2024-05-06 (×2): 2 [IU] via SUBCUTANEOUS
  Administered 2024-05-06: 08:00:00 1 [IU] via SUBCUTANEOUS
  Administered 2024-05-06: 12:00:00 2 [IU] via SUBCUTANEOUS
  Administered 2024-05-07: 09:00:00 1 [IU] via SUBCUTANEOUS
  Administered 2024-05-07: 13:00:00 2 [IU] via SUBCUTANEOUS
  Filled 2024-05-04: qty 1
  Filled 2024-05-04: qty 2
  Filled 2024-05-04: qty 1
  Filled 2024-05-04 (×2): qty 2
  Filled 2024-05-04: qty 3
  Filled 2024-05-04: qty 1
  Filled 2024-05-04: qty 3
  Filled 2024-05-04 (×2): qty 2

## 2024-05-04 MED ORDER — HEPARIN SODIUM (PORCINE) 5000 UNIT/ML IJ SOLN
5000.0000 [IU] | Freq: Two times a day (BID) | INTRAMUSCULAR | Status: DC
  Administered 2024-05-04 – 2024-05-07 (×7): 5000 [IU] via SUBCUTANEOUS
  Filled 2024-05-04 (×7): qty 1

## 2024-05-04 MED ORDER — GABAPENTIN 300 MG PO CAPS
300.0000 mg | ORAL_CAPSULE | Freq: Three times a day (TID) | ORAL | Status: DC
  Administered 2024-05-04 – 2024-05-07 (×10): 300 mg via ORAL
  Filled 2024-05-04 (×10): qty 1

## 2024-05-04 MED ORDER — DIVALPROEX SODIUM ER 500 MG PO TB24
500.0000 mg | ORAL_TABLET | Freq: Two times a day (BID) | ORAL | Status: DC
  Administered 2024-05-04 – 2024-05-07 (×7): 500 mg via ORAL
  Filled 2024-05-04: qty 2
  Filled 2024-05-04 (×2): qty 1
  Filled 2024-05-04 (×2): qty 2
  Filled 2024-05-04 (×2): qty 1

## 2024-05-04 MED ORDER — ENSURE PLUS HIGH PROTEIN PO LIQD
237.0000 mL | Freq: Two times a day (BID) | ORAL | Status: DC
  Administered 2024-05-04: 16:00:00 237 mL via ORAL

## 2024-05-04 MED ORDER — ACETAMINOPHEN 325 MG PO TABS
650.0000 mg | ORAL_TABLET | Freq: Four times a day (QID) | ORAL | Status: DC | PRN
  Administered 2024-05-04: 23:00:00 650 mg via ORAL
  Filled 2024-05-04 (×2): qty 2

## 2024-05-04 MED ORDER — LISINOPRIL 20 MG PO TABS
20.0000 mg | ORAL_TABLET | Freq: Every day | ORAL | Status: DC
  Administered 2024-05-05 – 2024-05-07 (×3): 20 mg via ORAL
  Filled 2024-05-04 (×3): qty 1
  Filled 2024-05-04: qty 2

## 2024-05-04 MED ORDER — DULOXETINE HCL 60 MG PO CPEP: 60.0000 mg | ORAL_CAPSULE | Freq: Every day | ORAL | Status: DC

## 2024-05-04 MED ORDER — DAPAGLIFLOZIN PROPANEDIOL 10 MG PO TABS: 10.0000 mg | ORAL_TABLET | Freq: Every day | ORAL | Status: DC

## 2024-05-04 MED ORDER — METOPROLOL SUCCINATE ER 25 MG PO TB24
25.0000 mg | ORAL_TABLET | Freq: Every day | ORAL | Status: AC
  Administered 2024-05-05 – 2024-05-07 (×3): 25 mg via ORAL
  Filled 2024-05-04 (×4): qty 1

## 2024-05-04 MED ORDER — BUPROPION HCL ER (XL) 150 MG PO TB24
150.0000 mg | ORAL_TABLET | Freq: Every day | ORAL | Status: AC
  Administered 2024-05-04 – 2024-05-07 (×4): 150 mg via ORAL
  Filled 2024-05-04 (×4): qty 1

## 2024-05-04 MED ORDER — FENOFIBRATE 160 MG PO TABS
160.0000 mg | ORAL_TABLET | Freq: Every day | ORAL | Status: DC
  Administered 2024-05-04 – 2024-05-07 (×4): 160 mg via ORAL
  Filled 2024-05-04 (×4): qty 1

## 2024-05-04 MED ORDER — QUETIAPINE FUMARATE 25 MG PO TABS: 75.0000 mg | ORAL_TABLET | Freq: Every day | ORAL | Status: DC

## 2024-05-04 MED ORDER — TROSPIUM CHLORIDE ER 60 MG PO CP24: 60.0000 mg | ORAL_CAPSULE | Freq: Every day | ORAL | Status: DC

## 2024-05-04 MED ORDER — PRAMIPEXOLE DIHYDROCHLORIDE 0.25 MG PO TABS
0.1250 mg | ORAL_TABLET | Freq: Every day | ORAL | Status: AC
  Administered 2024-05-04 – 2024-05-06 (×3): 0.125 mg via ORAL
  Filled 2024-05-04 (×4): qty 0.5

## 2024-05-04 MED ORDER — QUETIAPINE FUMARATE 25 MG PO TABS
50.0000 mg | ORAL_TABLET | Freq: Every day | ORAL | Status: DC
  Administered 2024-05-04 – 2024-05-06 (×3): 50 mg via ORAL
  Filled 2024-05-04 (×3): qty 2

## 2024-05-04 MED ORDER — ASPIRIN 81 MG PO TBEC
81.0000 mg | DELAYED_RELEASE_TABLET | Freq: Every day | ORAL | Status: DC
  Administered 2024-05-04 – 2024-05-07 (×4): 81 mg via ORAL
  Filled 2024-05-04 (×4): qty 1

## 2024-05-04 MED ORDER — PRIMIDONE 50 MG PO TABS: 150.0000 mg | ORAL_TABLET | Freq: Two times a day (BID) | ORAL | Status: DC

## 2024-05-04 MED ORDER — DULOXETINE HCL 30 MG PO CPEP
30.0000 mg | ORAL_CAPSULE | Freq: Every day | ORAL | Status: AC
  Administered 2024-05-04 – 2024-05-07 (×4): 30 mg via ORAL
  Filled 2024-05-04 (×4): qty 1

## 2024-05-04 MED ORDER — ACETAMINOPHEN 650 MG RE SUPP: 650.0000 mg | Freq: Four times a day (QID) | RECTAL | Status: DC | PRN

## 2024-05-04 MED ORDER — PANTOPRAZOLE SODIUM 40 MG PO TBEC
40.0000 mg | DELAYED_RELEASE_TABLET | Freq: Every day | ORAL | Status: DC
  Administered 2024-05-04 – 2024-05-07 (×4): 40 mg via ORAL
  Filled 2024-05-04 (×4): qty 1

## 2024-05-04 MED ORDER — SODIUM CHLORIDE 0.9 % IV BOLUS
500.0000 mL | Freq: Once | INTRAVENOUS | Status: AC
  Administered 2024-05-04: 04:00:00 500 mL via INTRAVENOUS

## 2024-05-04 MED ORDER — LACTULOSE 10 GM/15ML PO SOLN
10.0000 g | Freq: Once | ORAL | Status: AC
  Administered 2024-05-04: 07:00:00 10 g via ORAL
  Filled 2024-05-04: qty 30

## 2024-05-04 MED ORDER — TRAZODONE HCL 100 MG PO TABS
100.0000 mg | ORAL_TABLET | Freq: Every day | ORAL | Status: DC
  Administered 2024-05-04: 23:00:00 100 mg via ORAL
  Filled 2024-05-04: qty 1

## 2024-05-04 MED ORDER — ROSUVASTATIN CALCIUM 10 MG PO TABS
40.0000 mg | ORAL_TABLET | Freq: Every day | ORAL | Status: AC
  Administered 2024-05-04 – 2024-05-07 (×4): 40 mg via ORAL
  Filled 2024-05-04: qty 2
  Filled 2024-05-04 (×2): qty 4
  Filled 2024-05-04: qty 2

## 2024-05-04 MED ORDER — PRIMIDONE 250 MG PO TABS
250.0000 mg | ORAL_TABLET | Freq: Every day | ORAL | Status: DC
  Administered 2024-05-04 – 2024-05-07 (×4): 250 mg via ORAL
  Filled 2024-05-04 (×4): qty 1

## 2024-05-04 MED ORDER — TRAZODONE HCL 100 MG PO TABS: 200.0000 mg | ORAL_TABLET | Freq: Every day | ORAL | Status: DC

## 2024-05-04 MED ORDER — PRIMIDONE 50 MG PO TABS
150.0000 mg | ORAL_TABLET | Freq: Every day | ORAL | Status: DC
  Administered 2024-05-04 – 2024-05-06 (×3): 150 mg via ORAL
  Filled 2024-05-04 (×4): qty 3

## 2024-05-04 MED ORDER — METOCLOPRAMIDE HCL 5 MG/ML IJ SOLN: 5.0000 mg | Freq: Four times a day (QID) | INTRAMUSCULAR | Status: AC | PRN

## 2024-05-04 MED ORDER — ACETAZOLAMIDE SODIUM 500 MG IJ SOLR
500.0000 mg | Freq: Once | INTRAMUSCULAR | Status: AC
  Administered 2024-05-04: 11:00:00 500 mg via INTRAVENOUS
  Filled 2024-05-04: qty 500

## 2024-05-04 MED ORDER — INSULIN ASPART 100 UNIT/ML IJ SOLN
0.0000 [IU] | Freq: Every day | INTRAMUSCULAR | Status: DC
  Filled 2024-05-04: qty 2

## 2024-05-04 NOTE — Evaluation (Signed)
 Physical Therapy Evaluation Patient Details Name: James Hood MRN: [id number] DOB: 1940/11/01 Today's Date: 05/04/2024  History of Present Illness  Pt is an 83 y/o M admitted on 05/04/24 after presenting with c/o generalized weakness & falls. Brain MRI negative. X-ray showed B basilar atelectasis. PMH: dementia, DM2, neuropathy, RLS, anxiety, depression, hx of alcoholism, HTN, hyperhtyroidism, essential tremor  Clinical Impression  Pt seen for PT evaluation with pt received in bed, son present. Son provided PLOF, home set up information. Pt with frequent falls recently. Pt is AxOx3 but demonstrates increased processing time, decreased awareness, decreased problem solving. Pt also with jerking movements with mobility that impair balance. Pt requires assistance to come to sitting EOB. Pt stands EOB & cued to take steps to L/R along EOB but pt with poor awareness & ability to follow instructions, continuing to have jerking movements. Pt with waxing/waning lethargy during session with pt & son reporting this has been happening. Recommend ongoing PT services to progress mobility as able; recommend post acute rehab <3 hours therapy/day upon d/c.         If plan is discharge home, recommend the following: A lot of help with walking and/or transfers;A lot of help with bathing/dressing/bathroom;Assistance with feeding;Assist for transportation;Assistance with cooking/housework;Direct supervision/assist for financial management;Supervision due to cognitive status;Help with stairs or ramp for entrance;Direct supervision/assist for medications management   Can travel by private vehicle   No    Equipment Recommendations Other (comment) (defer to next venue)  Recommendations for Other Services       Functional Status Assessment Patient has had a recent decline in their functional status and demonstrates the ability to make significant improvements in function in a reasonable and predictable amount of  time.     Precautions / Restrictions Precautions Precautions: Fall Restrictions Weight Bearing Restrictions Per Provider Order: No      Mobility  Bed Mobility Overal bed mobility: Needs Assistance Bed Mobility: Supine to Sit     Supine to sit: Min assist, HOB elevated, Used rails (assistance to upright trunk to prevent posterior LOB 2/2 twitching) Sit to supine: Supervision, HOB elevated, Used rails        Transfers Overall transfer level: Needs assistance Equipment used: Rolling walker (2 wheels) Transfers: Sit to/from Stand Sit to Stand: Min assist, From elevated surface           General transfer comment: decreased awareness re: hand placement    Ambulation/Gait                  Stairs            Wheelchair Mobility     Tilt Bed    Modified Rankin (Stroke Patients Only)       Balance Overall balance assessment: Needs assistance Sitting-balance support: Bilateral upper extremity supported, Feet supported Sitting balance-Leahy Scale: Poor     Standing balance support: Reliant on assistive device for balance, Bilateral upper extremity supported Standing balance-Leahy Scale: Poor                               Pertinent Vitals/Pain Pain Assessment Pain Assessment: Faces Faces Pain Scale: No hurt    Home Living Family/patient expects to be discharged to:: Private residence Living Arrangements: Spouse/significant other Available Help at Discharge: Family;Available 24 hours/day (wife who also has health issues and cognitive stuff) Type of Home: Mobile home (camper) Home Access: Stairs to enter Entrance Stairs-Rails: Can reach both;Left;Right Entrance  Stairs-Number of Steps: 3   Home Layout: One level Home Equipment: Cane - single point;Rollator (4 wheels) Additional Comments: rollator is an upright walker with BUE platforms, bulky and difficult to negotiate/travel with per patient    Prior Function Prior Level of  Function : Independent/Modified Independent;History of Falls (last six months)             Mobility Comments: a few falls a day, MOD I amb within camper furniture cruising, now falling inside the home often; supposed to use SPC outdoors and also has a walker, but falls outside the home a lot too ADLs Comments: Indep with all ADLs, wife can assist but has her own health issues     Extremity/Trunk Assessment   Upper Extremity Assessment Upper Extremity Assessment: Generalized weakness LUE Deficits / Details: thumb fx splinted    Lower Extremity Assessment Lower Extremity Assessment: Generalized weakness (wound to 2nd toe on R foot (bandaid but blood on sock, not actively bleeding))       Communication   Communication Communication: Impaired Factors Affecting Communication: Difficulty expressing self (intermittent)    Cognition Arousal: Lethargic     PT - Cognitive impairments: Difficult to assess, Awareness, Safety/Judgement, Sequencing, Problem solving, Initiation, Memory, Attention                       PT - Cognition Comments: AxOx3 Following commands: Impaired Following commands impaired: Follows one step commands with increased time     Cueing Cueing Techniques: Verbal cues, Gestural cues, Tactile cues     General Comments General comments (skin integrity, edema, etc.): thumb/L forearm/hand splinted and wrapped intact pre/post session    Exercises     Assessment/Plan    PT Assessment Patient needs continued PT services  PT Problem List Decreased strength;Cardiopulmonary status limiting activity;Pain;Decreased range of motion;Decreased coordination;Decreased cognition;Decreased activity tolerance;Decreased knowledge of use of DME;Decreased safety awareness;Decreased balance;Decreased mobility;Decreased knowledge of precautions;Impaired sensation;Decreased skin integrity       PT Treatment Interventions DME instruction;Therapeutic exercise;Stair  training;Neuromuscular re-education;Gait training;Balance training;Functional mobility training;Cognitive remediation;Therapeutic activities;Patient/family education;Modalities    PT Goals (Current goals can be found in the Care Plan section)  Acute Rehab PT Goals Patient Stated Goal: get better, go to rehab PT Goal Formulation: With patient/family Time For Goal Achievement: 05/18/24 Potential to Achieve Goals: Good    Frequency Min 2X/week     Co-evaluation               AM-PAC PT 6 Clicks Mobility  Outcome Measure Help needed turning from your back to your side while in a flat bed without using bedrails?: A Little Help needed moving from lying on your back to sitting on the side of a flat bed without using bedrails?: A Lot Help needed moving to and from a bed to a chair (including a wheelchair)?: A Lot Help needed standing up from a chair using your arms (e.g., wheelchair or bedside chair)?: A Lot Help needed to walk in hospital room?: A Lot Help needed climbing 3-5 steps with a railing? : Total 6 Click Score: 12    End of Session   Activity Tolerance: Patient limited by lethargy Patient left: in bed;with call bell/phone within reach;with bed alarm set;with family/visitor present (4 rails up (son okay with this)) Nurse Communication: Mobility status PT Visit Diagnosis: Muscle weakness (generalized) (M62.81);Other abnormalities of gait and mobility (R26.89);Unsteadiness on feet (R26.81);History of falling (Z91.81);Difficulty in walking, not elsewhere classified (R26.2)    Time: 8866-8852 PT Time  Calculation (min) (ACUTE ONLY): 14 min   Charges:   PT Evaluation $PT Eval Low Complexity: 1 Low   PT General Charges $$ ACUTE PT VISIT: 1 Visit         Richerd Pinal, PT, DPT 05/04/2024, 12:11 PM   Richerd CHRISTELLA Pinal 05/04/2024, 12:09 PM

## 2024-05-04 NOTE — TOC PASRR Note (Signed)
 Please be advised that the above-named patient will require a short-term nursing home stay-anticipated 30 days or less for rehabilitation and strengthening. The plan is for return home.

## 2024-05-04 NOTE — Evaluation (Signed)
 Occupational Therapy Evaluation Patient Details Name: James Hood MRN: [id number] DOB: 05/09/1941 Today's Date: 05/04/2024   History of Present Illness   Pt is a 83 y.o. male presented with frequent falls and increasing confusion at home. Current MD assessment: Acute on chronic ambulatory impairment, acute metabolic encephalopathy and acute nondisplaced extra-articular fracture at the base of the proximal phalanx of the thumb. PMH of advanced dementia, anxiety/depression, BPH, HTN, HLD, CKD stage IIIb, second-degree Mobitz type I heart block, recurrent falls and balance issue, IIDM, diabetic neuropathy, RLS.     Clinical Impressions Pt was seen for OT evaluation this date. PTA, pt resides in a mobile home/camper with his wife with 3 STE. He is typically IND with ADLs and mobility, but reports increased falls ~3 per day per son. He does not utilize AD within the camper, but intermittently uses a cane or walker when outside. Pt presents with deficits in strength, balance, coordination and safety awareness limiting their ability to perform ADL management at baseline level. Pt currently requires Min/CGA for bed mobility tasks and is noted with fair seated balance and intermittent posterior lean/fatigue requiring cues for staying awake and maintaining upright balance. Mod A required to doff/donn socks. He performed STS from EOB and toilet with Min/mod A with cues for hand placement/safety. Pt demo ADL transfer ~15-20 ft to bathroom using RW with Min/mdo A for safety/RW management. Max A for clothing management and CGA for seated peri-care via lateral leans. Pt wheeled back to room d/t fatigue and increased shakiness. He remains a very high fall risk.  Pt would benefit from skilled OT services to address noted impairments and functional limitations to maximize safety and independence while minimizing future risk of falls, injury, and readmission. Do anticipate the need for follow up OT services upon  acute hospital DC.      If plan is discharge home, recommend the following:   A lot of help with walking and/or transfers;A lot of help with bathing/dressing/bathroom;Help with stairs or ramp for entrance;Assistance with cooking/housework     Functional Status Assessment   Patient has had a recent decline in their functional status and demonstrates the ability to make significant improvements in function in a reasonable and predictable amount of time.     Equipment Recommendations   Other (comment) (defer to next venue)     Recommendations for Other Services         Precautions/Restrictions         Mobility Bed Mobility Overal bed mobility: Needs Assistance Bed Mobility: Supine to Sit, Sit to Supine     Supine to sit: Contact guard, HOB elevated, Used rails, Min assist Sit to supine: Contact guard assist   General bed mobility comments: increased time and effort, intermittent posterior lean requiring assist and cues to regain upright posture    Transfers Overall transfer level: Needs assistance Equipment used: Rolling walker (2 wheels) Transfers: Sit to/from Stand, Bed to chair/wheelchair/BSC Sit to Stand: Min assist, Mod assist     Step pivot transfers: Min assist     General transfer comment: lift off assist and cues for hand placement to stand from EOB and toilet      Balance Overall balance assessment: Needs assistance Sitting-balance support: Feet supported, Single extremity supported Sitting balance-Leahy Scale: Fair Sitting balance - Comments: intermittent posterior lean requiring cues and assist to regain upright balance   Standing balance support: Reliant on assistive device for balance, Bilateral upper extremity supported Standing balance-Leahy Scale: Poor Standing balance comment: external  support at all times and use of RW with heavy UE reliance                           ADL either performed or assessed with clinical  judgement   ADL Overall ADL's : Needs assistance/impaired                     Lower Body Dressing: Maximal assistance;Sit to/from stand;Sitting/lateral leans;Moderate assistance Lower Body Dressing Details (indicate cue type and reason): to doff/donn bil socks and brief Toilet Transfer: Minimal assistance;Moderate assistance;Rolling walker (2 wheels);Regular Toilet   Toileting- Clothing Manipulation and Hygiene: Contact guard assist;Sitting/lateral lean       Functional mobility during ADLs: Minimal assistance;Moderate assistance;Rolling walker (2 wheels) General ADL Comments: ADL transfer ~15-20 ft using RW with Min/Mod A for safety, cues, RW management, very notable shakiness and weakness; max A for clothing management and LB dressing to change out brief, CGA via seated lateral leans on toilet to perform peri-care     Vision         Perception         Praxis         Pertinent Vitals/Pain Pain Assessment Pain Assessment: Faces Faces Pain Scale: Hurts little more Pain Location: L thumb Pain Descriptors / Indicators: Discomfort, Sore Pain Intervention(s): Monitored during session, Repositioned     Extremity/Trunk Assessment Upper Extremity Assessment Upper Extremity Assessment: Generalized weakness;Right hand dominant;LUE deficits/detail LUE Deficits / Details: thumb fx splinted   Lower Extremity Assessment Lower Extremity Assessment: Generalized weakness       Communication Communication Communication: No apparent difficulties   Cognition Arousal: Lethargic, Alert Behavior During Therapy: Impulsive                                 Following commands: Impaired Following commands impaired: Follows one step commands with increased time     Cueing  General Comments   Cueing Techniques: Verbal cues;Gestural cues;Tactile cues  thumb/L forearm/hand splinted and wrapped intact pre/post session   Exercises Other Exercises Other Exercises:  Edu on role of OT in acute setting and DC recommendations.   Shoulder Instructions      Home Living Family/patient expects to be discharged to:: Private residence Living Arrangements: Spouse/significant other Available Help at Discharge: Family;Available 24 hours/day (wife who also has health issues and cognitive stuff) Type of Home: Mobile home (camper) Home Access: Stairs to enter Entrance Stairs-Number of Steps: 3 Entrance Stairs-Rails: Can reach both;Left;Right Home Layout: One level     Bathroom Shower/Tub: Walk-in shower         Home Equipment: Cane - single point;Rollator (4 wheels)   Additional Comments: rollator is an upright walker with BUE platforms, bulky and difficult to negotiate/travel with per patient      Prior Functioning/Environment Prior Level of Function : Independent/Modified Independent;History of Falls (last six months)             Mobility Comments: a few falls a day, MOD I amb within camper furniture cruising, now falling inside the home often; supposed to use SPC outdoors and also has a walker, but falls outside the home a lot too ADLs Comments: Indep with all ADLs, wife can assist but has her own health issues    OT Problem List: Decreased strength;Decreased coordination;Decreased activity tolerance;Pain;Impaired balance (sitting and/or standing);Decreased safety awareness   OT Treatment/Interventions: Self-care/ADL training;Therapeutic exercise;Therapeutic  activities;Patient/family education;DME and/or AE instruction;Energy conservation;Balance training      OT Goals(Current goals can be found in the care plan section)   Acute Rehab OT Goals Patient Stated Goal: get better OT Goal Formulation: With patient/family Time For Goal Achievement: 05/18/24 Potential to Achieve Goals: Fair ADL Goals Pt Will Perform Grooming: sitting;with set-up Pt Will Perform Lower Body Dressing: with contact guard assist;sitting/lateral leans;sit to/from  stand Pt Will Transfer to Toilet: with supervision;with contact guard assist;ambulating   OT Frequency:  Min 2X/week    Co-evaluation              AM-PAC OT 6 Clicks Daily Activity     Outcome Measure Help from another person eating meals?: A Little Help from another person taking care of personal grooming?: A Little Help from another person toileting, which includes using toliet, bedpan, or urinal?: A Lot Help from another person bathing (including washing, rinsing, drying)?: A Lot Help from another person to put on and taking off regular upper body clothing?: A Lot Help from another person to put on and taking off regular lower body clothing?: A Lot 6 Click Score: 14   End of Session Equipment Utilized During Treatment: Rolling walker (2 wheels) Nurse Communication: Mobility status  Activity Tolerance: Patient tolerated treatment well Patient left: in bed;with call bell/phone within reach;with bed alarm set;with family/visitor present  OT Visit Diagnosis: Other abnormalities of gait and mobility (R26.89);Muscle weakness (generalized) (M62.81);Unsteadiness on feet (R26.81);Repeated falls (R29.6)                Time: 8950-8877 OT Time Calculation (min): 33 min Charges:  OT General Charges $OT Visit: 1 Visit OT Evaluation $OT Eval Moderate Complexity: 1 Mod OT Treatments $Self Care/Home Management : 8-22 mins  Kaiyu Mirabal Chrismon, OTR/L 05/04/2024, 11:38 AM  Malita Ignasiak E Chrismon 05/04/2024, 11:35 AM

## 2024-05-04 NOTE — ED Notes (Addendum)
 Pt given oral meds and began to immediately cough. Sent secure message to Speech and Will notify provider.

## 2024-05-04 NOTE — TOC Progression Note (Signed)
 Transition of Care [facility]) - Progression Note    Patient Details  Name: James Hood MRN: [id number] Date of Birth: January 10, 1941  [facility] CM/SW Contact  Nathanael CHRISTELLA Ring, RN Phone Number: 05/04/2024, 3:09 PM  Clinical Narrative:    Spoke with Wife on the phone, she is in agreement with therapy recommendations for SNF.  SNF workup started, she is not familiar with any of the facilities and has no preference at this time.  Once there are bed offers will present offers for her to choose a facility.    Expected Discharge Plan: Skilled Nursing Facility Barriers to Discharge: Continued Medical Work up               Expected Discharge Plan and Services   Discharge Planning Services: CM Consult   Living arrangements for the past 2 months: Mobile Home (Travel trailer)                                       Social Drivers of Health (SDOH) Interventions SDOH Screenings   Food Insecurity: No Food Insecurity (12/31/2023)  Housing: Unknown (12/31/2023)  Transportation Needs: No Transportation Needs (12/31/2023)  Utilities: Not At Risk (01/01/2024)  Alcohol  Screen: Low Risk (12/31/2023)  Depression (PHQ2-9): Low Risk (01/01/2024)  Financial Resource Strain: Medium Risk (12/31/2023)  Physical Activity: Insufficiently Active (12/31/2023)  Social Connections: Socially Integrated (12/31/2023)  Stress: No Stress Concern Present (12/31/2023)  Tobacco Use: High Risk (05/04/2024)  Health Literacy: Inadequate Health Literacy (01/01/2024)    Readmission Risk Interventions    05/04/2024   12:18 PM  Readmission Risk Prevention Plan  Transportation Screening Complete  Medication Review (RN Care Manager) Complete  PCP or Specialist appointment within 3-5 days of discharge Complete  HRI or Home Care Consult Complete  SW Recovery Care/Counseling Consult Not Complete  SW Consult Not Complete Comments NA  Palliative Care Screening Not Applicable  Skilled Nursing Facility  Complete

## 2024-05-04 NOTE — ED Notes (Signed)
 Left message for son Zell, aware that patient was sent upstairs and was resting quietly when he left ED to go upstairs
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
## 2024-05-04 NOTE — TOC PASRR Note (Signed)
To Whom It May Concern:  Please be advised that the above-name patient's dementia diagnosis is primary and supersedes his mental illness.

## 2024-05-04 NOTE — ED Notes (Signed)
 Pt stood up at bedside with RN assistance and walker. Pt was able to use urinal and to take a couple steps but was shaky. Pt assisted back to bed

## 2024-05-04 NOTE — ED Notes (Signed)
 Pt's Son in [place] brought pt's home meds and dropped them off at 1430 stating that the Dr wanted to see what pt was taking at home. Medication was counted and sealed in bags by this RN, Penne PEAK, and Environmental Education Officer. Alfonso took meds to pharmacy. Bag tags were stapled to pt labels. Listed below are the names and count of medications sent to pharmacy.    Bag 1  Trazadone 150 mg                                 101.5 tab Oxycodone -acetaminophen                    13 tabs Bupropion  HCL XL 150mg                      106 tabs Quetiapine  Fumarate 25mg                     275 tabs Gabapentin  600mg  tab (bottle 4/5)         73 tabs Divalproex  SOD ER 500mg  (bottle 1/2)  87 tabs Metformin  HCL ER 500mg                       71 tabs Pramipexole  0.125mg   65 tabs Lisinopril -hydrochlorothiazide  20-25mg    204 tabs Pantoprazole  SOD DR 40mg                   4 tabs Duloxetine  HCL DR 30mg                        73 tabs Tradjenta  5mg                                          43 tabs Donepezil  HCL 10mg                               50 tabs Metroprolol SUCC ER 25mg                    62 tabs Aspirine 81mg   EC                                  219 tabs  Bag 2  Gabapentin  600mg  (big white bottle)     60 tabs  Bag 3  Memantine  HCL 10mg                            35 tabs Omega 3 1g                                           120 tabs Centrum MV                                           18 tabs Fenofibrate  145mg   64 tabs Divalproex  SOD ER 500mg  (bottle 2/2) 93 tabs Diphenoxylate -atrop 2.5-0.025mg           60 tabs Metformin  HCL ER 500mg                      50 tabs Pantoprazole  SOD DR 40mg                  90 tabs Primidone  50mg                                      765mg 

## 2024-05-04 NOTE — ED Notes (Signed)
 Bipap temporarily removed to administer lactulose  and for pt to go for MRI.

## 2024-05-04 NOTE — [place] FL2 (Signed)
 [place]  MEDICAID [id number] LEVEL OF CARE FORM     IDENTIFICATION  Patient Name: James Hood Birthdate: 09/07/1940 Sex: male Admission Date (Current Location): 05/04/2024  [place] and [place] Number:  Chiropodist and Address:  [facility], [address], [place], [place] [address]      Provider Number: [id number]  Attending Physician Name and Address:  Laurita Cort DASEN, MD  Relative Name and Phone Number:  Christin Straka- wife- [phone]    Current Level of Care: Hospital Recommended Level of Care: Skilled Nursing Facility Prior Approval Number:    Date Approved/Denied:   PASRR Number:    Discharge Plan: SNF    Current Diagnoses: Patient Active Problem List   Diagnosis Date Noted   Recurrent falls 05/04/2024   AMS (altered mental status) 05/04/2024   Neuropathic pain of left ankle 04/21/2024   Abnormal EKG 01/01/2024   At risk for polypharmacy 11/29/2023   Bradycardia 09/30/2023   Thrush 05/06/2023   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) 01/31/2023   Sciatica of left side associated with disorder of lumbar spine 01/31/2023   Irregular heart beat 01/16/2023   Abnormal breath sounds 12/04/2022   Right shoulder pain 12/04/2022   Tinnitus 08/27/2022   Peroneal tendonitis, left 08/16/2022   Peroneal tendinitis, right 07/20/2022   Numbness and tingling in left hand 07/11/2022   Hypoxia 06/27/2022   Hypersomnia 05/24/2022   Chronic cough 11/10/2021   Pressure injury of buttock, stage 1 08/08/2021   RLS (restless legs syndrome) 06/26/2021   Bilateral leg pain 06/26/2021   Primary osteoarthritis of left shoulder 01/25/2021   Chronic, continuous use of opioids 11/15/2020   Therapeutic opioid-induced constipation (OIC) 11/15/2020   Fatigue 10/11/2020   OAB (overactive bladder) 08/08/2020   Prolonged Q-T interval on ECG 06/08/2020   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   Osteochondral defect of ankle     Weight loss 07/23/2019   Chronic pain of left ankle 02/27/2019   Vitamin D  deficiency 02/18/2019   Chronic left shoulder pain 02/02/2019   Neuropathy 09/24/2018   Lumbosacral radiculopathy 01/14/2018   Primary osteoarthritis of left ankle 01/14/2018   Lumbar facet joint syndrome 01/14/2018   Chronic pain syndrome 08/26/2017   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   Thyroid  nodule 02/27/2017   Fatty liver 02/27/2017   Right hip pain 08/16/2016   Lumbar herniated disc 02/03/2016   Anemia 12/09/2015   Falls 09/16/2015   Barrett's esophagus 09/16/2015   Medication management 09/16/2015   Benign prostatic hyperplasia 09/16/2015   Trochanteric bursitis of left hip 09/09/2015   Difficulty in walking 06/01/2015   HLD (hyperlipidemia) 02/23/2015   DJD (degenerative joint disease) of knee 11/15/2014   Hypertension    Tremor, essential    Primary localized osteoarthritis of right knee    Anxiety and depression    Tobacco abuse 11/18/2013   Chronic diarrhea 11/12/2013   Benign neoplasm of colon 08/25/2013   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   Type 2 diabetes mellitus (HCC) 10/06/2012    Orientation RESPIRATION BLADDER Height & Weight     Self  Normal Incontinent Weight: 72.8 kg Height:     BEHAVIORAL SYMPTOMS/MOOD NEUROLOGICAL BOWEL NUTRITION STATUS      Incontinent Diet (Regular)  AMBULATORY STATUS COMMUNICATION OF NEEDS Skin   Extensive Assist Verbally Skin abrasions (Splint to left hand)                       Personal  Care Assistance Level of Assistance  Bathing, Feeding, Dressing Bathing Assistance: Maximum assistance Feeding assistance: Maximum assistance Dressing Assistance: Maximum assistance     Functional Limitations Info  Sight, Hearing, Speech Sight Info: Adequate Hearing Info: Adequate      SPECIAL CARE FACTORS FREQUENCY  PT (By licensed PT), OT (By licensed OT)     PT Frequency: 5 days per week OT Frequency: 5 days per week             Contractures Contractures Info: Not present    Additional Factors Info  Code Status, Allergies Code Status Info: DNR- limited Allergies Info: NKA           Current Medications (05/04/2024):  This is the current hospital active medication list Current Facility-Administered Medications  Medication Dose Route Frequency Provider Last Rate Last Admin   acetaminophen  (TYLENOL ) tablet 650 mg  650 mg Oral Q6H PRN Laurita Cort DASEN, MD       Or   acetaminophen  (TYLENOL ) suppository 650 mg  650 mg Rectal Q6H PRN Laurita Cort DASEN, MD       aspirin  EC tablet 81 mg  81 mg Oral Daily Laurita Cort T, MD   81 mg at 05/04/24 1043   buPROPion  (WELLBUTRIN  XL) 24 hr tablet 150 mg  150 mg Oral Daily Laurita Cort T, MD   150 mg at 05/04/24 1043   divalproex  (DEPAKOTE  ER) 24 hr tablet 500 mg  500 mg Oral BID Laurita Cort T, MD   500 mg at 05/04/24 1042   DULoxetine  (CYMBALTA ) DR capsule 30 mg  30 mg Oral Daily Zhang, Ping T, MD   30 mg at 05/04/24 1043   fenofibrate  tablet 160 mg  160 mg Oral Daily Laurita Cort T, MD   160 mg at 05/04/24 1043   gabapentin  (NEURONTIN ) capsule 300 mg  300 mg Oral TID Laurita Cort T, MD   300 mg at 05/04/24 1042   heparin  injection 5,000 Units  5,000 Units Subcutaneous Q12H Laurita Cort DASEN, MD   5,000 Units at 05/04/24 9044   insulin  aspart (novoLOG ) injection 0-5 Units  0-5 Units Subcutaneous QHS Laurita Cort T, MD       insulin  aspart (novoLOG ) injection 0-9 Units  0-9 Units Subcutaneous TID WC Laurita Cort DASEN, MD   2 Units at 05/04/24 1322   lisinopril  (ZESTRIL ) tablet 20 mg  20 mg Oral Daily Laurita Cort T, MD       metoCLOPramide  (REGLAN ) injection 5 mg  5 mg Intravenous Q6H PRN Laurita Cort T, MD       metoprolol  succinate (TOPROL -XL) 24 hr tablet 25 mg  25 mg Oral Daily Laurita Cort T, MD       pantoprazole  (PROTONIX ) EC tablet 40 mg  40 mg Oral Daily Zhang, Ping T, MD   40 mg at 05/04/24 1042   pramipexole  (MIRAPEX ) tablet 0.125 mg  0.125 mg Oral QHS Laurita Cort T, MD       primidone   (MYSOLINE ) tablet 250 mg  250 mg Oral Daily Chappell, Alex B, RPH   250 mg at 05/04/24 1041   And   primidone  (MYSOLINE ) tablet 150 mg  150 mg Oral QHS Chappell, Alex B, RPH       QUEtiapine  (SEROQUEL ) tablet 50 mg  50 mg Oral QHS Laurita Cort T, MD       rosuvastatin  (CRESTOR ) tablet 40 mg  40 mg Oral Daily Laurita Cort T, MD   40 mg at 05/04/24 1042   tamsulosin  (  FLOMAX ) capsule 0.4 mg  0.4 mg Oral QPC supper Laurita Manor T, MD       traZODone  (DESYREL ) tablet 100 mg  100 mg Oral QHS Laurita Manor DASEN, MD       Current Outpatient Medications  Medication Sig Dispense Refill   aspirin  EC 81 MG tablet Take 81 mg by mouth daily.     buPROPion  (WELLBUTRIN  XL) 150 MG 24 hr tablet TAKE 1 TABLET BY MOUTH DAILY 90 tablet 3   dapagliflozin  propanediol (FARXIGA ) 10 MG TABS tablet Take 10 mg by mouth daily.     diphenoxylate -atropine  (LOMOTIL ) 2.5-0.025 MG tablet Take by mouth.     divalproex  (DEPAKOTE  ER) 500 MG 24 hr tablet Take 1 tablet by mouth 2 (two) times daily.     donepezil  (ARICEPT ) 10 MG tablet Take 10 mg by mouth at bedtime.     DULoxetine  (CYMBALTA ) 30 MG capsule TAKE 2 CAPSULES BY MOUTH DAILY 60 capsule 3   fenofibrate  (TRICOR ) 145 MG tablet Take 1 tablet (145 mg total) by mouth daily. 90 tablet 3   gabapentin  (NEURONTIN ) 600 MG tablet TAKE TWO TABLETS BY MOUTH THREE TIMES A DAY 180 tablet 1   linagliptin  (TRADJENTA ) 5 MG TABS tablet Take 1 tablet (5 mg total) by mouth daily. 90 tablet 3   lisinopril -hydrochlorothiazide  (ZESTORETIC ) 20-25 MG tablet Take 1 tablet by mouth 2 (two) times daily. 180 tablet 3   memantine  (NAMENDA ) 10 MG tablet Take 1 tablet by mouth daily.     metFORMIN  (GLUCOPHAGE -XR) 500 MG 24 hr tablet Take 1 tablet (500 mg total) by mouth 2 (two) times daily. 180 tablet 1   metoprolol  succinate (TOPROL -XL) 25 MG 24 hr tablet Take 1 tablet (25 mg total) by mouth daily. 90 tablet 3   Multiple Vitamins-Minerals (CENTRUM SILVER PO) Take 1 tablet by mouth daily.     nystatin   (MYCOSTATIN ) 100000 UNIT/ML suspension TAKE 5 MLS BY MOUTH 4 TIMES A DAY *SWISH IN MOUTH AND RETAIN FOR AS LONG AS POSSIBLE (SEVERAL MINUTES) BEFORE SWALLOWING* USE FOR 7-10 DAYS 120 mL 0   omega-3 acid ethyl esters (LOVAZA ) 1 g capsule Take 2 capsules (2 g total) by mouth 2 (two) times daily. 360 capsule 1   pantoprazole  (PROTONIX ) 40 MG tablet Take 40 mg by mouth daily.     pramipexole  (MIRAPEX ) 0.125 MG tablet TAKE 1 TABLET BY MOUTH EVERY NIGHT 2-3 HOURS BEFORE BEDTIME 90 tablet 1   primidone  (MYSOLINE ) 50 MG tablet Take 150-250 mg by mouth 2 (two) times daily. Take 250 mg by mouth in the morning & take 150 mg at night.     QUEtiapine  (SEROQUEL ) 25 MG tablet Take 75 mg by mouth at bedtime.     rosuvastatin  (CRESTOR ) 40 MG tablet Take 1 tablet (40 mg total) by mouth daily. 90 tablet 3   tamsulosin  (FLOMAX ) 0.4 MG CAPS capsule TAKE 2 CAPSULES BY MOUTH DAILY AFTER SUPPER 180 capsule 0   traZODone  (DESYREL ) 100 MG tablet Take 200 mg by mouth at bedtime.     Trospium  Chloride 60 MG CP24 Take 1 capsule (60 mg total) by mouth daily. 30 capsule 11   Continuous Glucose Receiver (FREESTYLE LIBRE 2 READER) DEVI Use to check glucose at least every 8 hours 1 each 0   Continuous Glucose Sensor (FREESTYLE LIBRE 2 SENSOR) MISC Apply every 14 days to check glucose 4 each 3   cyclobenzaprine  (FLEXERIL ) 10 MG tablet Take 1 tablet (10 mg total) by mouth 3 (three) times daily as needed  for muscle spasms. (Patient not taking: Reported on 05/04/2024) 30 tablet 2   mupirocin ointment (BACTROBAN) 2 % Apply 1 Application topically 2 (two) times daily. (Patient not taking: Reported on 05/04/2024)     vitamin C  (ASCORBIC ACID ) 500 MG tablet Take 1,000 mg by mouth daily. (Patient not taking: Reported on 05/04/2024)       Discharge Medications: Please see discharge summary for a list of discharge medications.  Relevant Imaging Results:  Relevant Lab Results:   Additional Information SS# [id number]  Nathanael CHRISTELLA Ring, RN
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
## 2024-05-04 NOTE — ED Notes (Signed)
 Bipap replaced, MD gave verbal order to have it on for 2 hrs and then repeat VBG

## 2024-05-04 NOTE — ED Triage Notes (Signed)
 Pt to ED BIB EMS wit c/o generalized weakness and multiple falls. Family reports symptoms progressive over the last few weeks. Recently treated for bronchitis. Denies fever, denies sick contacts. Pt does have a wet, productive cough.

## 2024-05-04 NOTE — H&P (Addendum)
 History and Physical    James Hood FMW:[id number] DOB: [birth year] DOA: 05/04/2024  PCP: Gretel App, NP (Confirm with patient/family/NH records and if not entered, this has to be entered at [facility] point of entry) Patient coming from: Home  I have personally briefly reviewed patient's old medical records in [facility] Link  Chief Complaint: legs are weak  HPI: James Hood is a 83 y.o. male with medical history significant of advanced dementia, anxiety/depression, BPH, HTN, HLD, CKD stage IIIb, second-degree Mobitz type I heart block, recurrent falls and balance issue, IIDM, diabetic neuropathy, RLS, presented with frequent falls and increasing confusion at home.  Patient lives with his wife at home, has been using roller walker to ambulate since last year.  This time, patient reported that he has been feeling weak on both of his legs and both feet for several weeks and reportedly he sustained multiple falls in last 24 hours.  Patient complaining about weakness with up to the level below bilateral knees.  Patient also reported that he has been having a dry cough for  few months, and appears that he lost about 15 to 20 pounds in last 3 months.  But he denied any trouble swallowing.  He also has significant neuropathy as well.  He denied any recently changes of his medications.  I tried to reach patient family however unfortunately nobody picked up the phone.  ED Course: Afebrile, nontachycardic blood pressure 130/60 O2 saturation 96% on room air.  Trauma scan showed left nondisplaced extra-articular fracture at the base of proximal phalanx of the thumb, CT head showed brain atrophy, no acute findings intracranially otherwise, CT cervical spine negative for fracture or dislocation.  Blood work showed VBG 7.33/69/36, ammonium 39, bicarb 32 BUN 32 creatinine 1.5 compared to baseline 1.2-1.4 glucose 138 K4.2 hemoglobin 6.3.  UA negative for UTI.  Troponin trending 45> 45, EKG sinus rhythm  chronic RBBB.  Brain MRI pending.  Review of Systems: Unable to perform, patient has a baseline dementia.  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   Colon polyps    Complication of anesthesia    Woke during first cataract procedure   Dementia (HCC)    Depression    Diabetes mellitus type 2 in nonobese (HCC)    GERD (gastroesophageal reflux disease)    BARRETTS ESOPHAGUS RESOLVED PER PATIENT   Headache    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HCC)    History of hiatal hernia    Hypercholesteremia    Hypertension    Hyperthyroidism    Intractable nausea and vomiting 11/29/2023   Intractable nausea and vomiting 11/29/2023   Nausea & vomiting 11/30/2023   Neuropathic pain    New onset headache 09/09/2015   Primary localized osteoarthritis of right knee    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   Stomach ulcer    Tachycardia 11/29/2023   Tremor, essential    Wears dentures    full upper   Wound infection complicating hardware 04/02/2018    Past Surgical History:  Procedure Laterality Date   ANKLE ARTHROSCOPY Left 09/29/2019   Procedure: LEFT ANKLE ARTHROSCOPY, DEBRIDEMENT;  Surgeon: Harden Jerona GAILS, MD;  Location: [facility];  Service: Orthopedics;  Laterality: Left;   BACK SURGERY     BIOPSY  01/18/2023   Procedure: BIOPSY;  Surgeon: Onita Elspeth Sharper, DO;  Location: [facility] ENDOSCOPY;  Service: Gastroenterology;;   CATARACT EXTRACTION W/PHACO Right 03/12/2023   Procedure: CATARACT EXTRACTION PHACO AND INTRAOCULAR LENS PLACEMENT (IOC) RIGHT DIABETIC 11.25 01:07.6;  Surgeon: Jaye Fallow, MD;  Location: [facility];  Service: Ophthalmology;  Laterality: Right;   CATARACT EXTRACTION W/PHACO Left 03/25/2023   Procedure: CATARACT EXTRACTION PHACO AND INTRAOCULAR LENS PLACEMENT (IOC) LEFT DIABETIC  7.15 00:51.0;  Surgeon: Jaye Fallow, MD;  Location: [facility];  Service: Ophthalmology;  Laterality: Left;   COLONOSCOPY WITH PROPOFOL  N/A 09/14/2016   Procedure: COLONOSCOPY WITH PROPOFOL ;  Surgeon: Lamar ONEIDA Holmes, MD;  Location: [facility] ENDOSCOPY;  Service: Endoscopy;  Laterality: N/A;   esophageal stretch     ESOPHAGOGASTRODUODENOSCOPY (EGD) WITH PROPOFOL  N/A 12/13/2020   Procedure: ESOPHAGOGASTRODUODENOSCOPY (EGD) WITH PROPOFOL ;  Surgeon: Maryruth Ole ONEIDA, MD;  Location: [facility] ENDOSCOPY;  Service: Endoscopy;  Laterality: N/A;  IDDM   ESOPHAGOGASTRODUODENOSCOPY (EGD) WITH PROPOFOL  N/A 01/18/2023   Procedure: ESOPHAGOGASTRODUODENOSCOPY (EGD) WITH PROPOFOL ;  Surgeon: Onita Elspeth Sharper, DO;  Location: [facility] ENDOSCOPY;  Service: Gastroenterology;  Laterality: N/A;   JOINT REPLACEMENT Right 2016   knee   KNEE SURGERY Left 10/2023   placed a stimulator in knee then was removed several months later   LUMBAR LAMINECTOMY/DECOMPRESSION MICRODISCECTOMY Left 02/03/2016   Procedure: LEFT L5-S1 DISKECTOMY;  Surgeon: Catalina Stains, MD;  Location: [facility]S;  Service: Neurosurgery;  Laterality: Left;  LEFT L5-S1 DISKECTOMY   MALONEY DILATION  01/18/2023   Procedure: MALONEY DILATION;  Surgeon: Onita Elspeth Sharper, DO;  Location: [facility] ENDOSCOPY;  Service: Gastroenterology;;   PULSE GENERATOR IMPLANT N/A 08/21/2017   Procedure: UNILATERAL PULSE GENERATOR IMPLANT;  Surgeon: Clois Fret, MD;  Location: [facility] ORS;  Service: Neurosurgery;  Laterality: N/A;   PULSE GENERATOR IMPLANT Right 04/02/2018   Procedure: REMOVAL OF PULSE GENERATOR IMPLANT AND LEADS;  Surgeon: Clois Fret, MD;  Location: [facility] ORS;  Service: Neurosurgery;  Laterality: Right;   TONSILLECTOMY     TOTAL KNEE ARTHROPLASTY Right 11/15/2014   Procedure: TOTAL KNEE ARTHROPLASTY;  Surgeon: Lamar Millman, MD;  Location: [facility] OR;  Service: Orthopedics;  Laterality: Right;     reports that he has been smoking  cigarettes. He started smoking about 69 years ago. He has a 34.5 pack-year smoking history. He has been exposed to tobacco smoke. He has never used smokeless tobacco. He reports that he does not currently use alcohol . He reports that he does not use drugs.  Allergies[1]  Family History  Problem Relation Age of Onset   Heart attack Mother    Heart disease Mother    Hypertension Father    Depression Father    Kidney cancer Neg Hx    Prostate cancer Neg Hx      Prior to Admission medications  Medication Sig Start Date End Date Taking? Authorizing Provider  aspirin  EC 81 MG tablet Take 81 mg by mouth daily.   Yes [provider]  buPROPion  (WELLBUTRIN  XL) 150 MG 24 hr tablet TAKE 1 TABLET BY MOUTH DAILY 03/23/24  Yes Gretel App, NP  dapagliflozin  propanediol (FARXIGA ) 10 MG TABS tablet Take 10 mg by mouth daily.   Yes [provider]  diphenoxylate -atropine  (LOMOTIL ) 2.5-0.025 MG tablet Take by mouth. 10/31/21  Yes [provider]  divalproex  (DEPAKOTE  ER) 500 MG 24 hr tablet Take 1 tablet by mouth 2 (two) times daily. 03/20/18  Yes [provider]  donepezil  (ARICEPT ) 10 MG tablet Take 10 mg by mouth at bedtime.  Yes [provider]  DULoxetine  (CYMBALTA ) 30 MG capsule TAKE 2 CAPSULES BY MOUTH DAILY 04/20/24  Yes Gretel App, NP  fenofibrate  (TRICOR ) 145 MG tablet Take 1 tablet (145 mg total) by mouth daily. 12/10/23  Yes Gretel App, NP  gabapentin  (NEURONTIN ) 600 MG tablet TAKE TWO TABLETS BY MOUTH THREE TIMES A DAY 11/02/19  Yes Maribeth Camellia MATSU, MD  linagliptin  (TRADJENTA ) 5 MG TABS tablet Take 1 tablet (5 mg total) by mouth daily. 11/27/23  Yes Gretel App, NP  lisinopril -hydrochlorothiazide  (ZESTORETIC ) 20-25 MG tablet Take 1 tablet by mouth 2 (two) times daily. 08/19/23  Yes Gretel App, NP  memantine  (NAMENDA ) 10 MG tablet Take 1 tablet by mouth daily. 10/18/20  Yes [provider]  metFORMIN  (GLUCOPHAGE -XR) 500 MG 24 hr tablet  Take 1 tablet (500 mg total) by mouth 2 (two) times daily. 12/26/23  Yes Gretel App, NP  metoprolol  succinate (TOPROL -XL) 25 MG 24 hr tablet Take 1 tablet (25 mg total) by mouth daily. 01/02/24  Yes Dorinda Drue DASEN, MD  Multiple Vitamins-Minerals (CENTRUM SILVER PO) Take 1 tablet by mouth daily.   Yes [provider]  nystatin  (MYCOSTATIN ) 100000 UNIT/ML suspension TAKE 5 MLS BY MOUTH 4 TIMES A DAY *SWISH IN MOUTH AND RETAIN FOR AS LONG AS POSSIBLE (SEVERAL MINUTES) BEFORE SWALLOWING* USE FOR 7-10 DAYS 01/06/24  Yes Gretel App, NP  omega-3 acid ethyl esters (LOVAZA ) 1 g capsule Take 2 capsules (2 g total) by mouth 2 (two) times daily. 01/30/24  Yes Gretel App, NP  pantoprazole  (PROTONIX ) 40 MG tablet Take 40 mg by mouth daily. 07/14/22  Yes [provider]  pramipexole  (MIRAPEX ) 0.125 MG tablet TAKE 1 TABLET BY MOUTH EVERY NIGHT 2-3 HOURS BEFORE BEDTIME 04/09/23  Yes Maribeth Camellia MATSU, MD  primidone  (MYSOLINE ) 50 MG tablet Take 150-250 mg by mouth 2 (two) times daily. Take 250 mg by mouth in the morning & take 150 mg at night.   Yes [provider]  QUEtiapine  (SEROQUEL ) 25 MG tablet Take 75 mg by mouth at bedtime.   Yes [provider]  rosuvastatin  (CRESTOR ) 40 MG tablet Take 1 tablet (40 mg total) by mouth daily. 08/26/23  Yes Gretel App, NP  tamsulosin  (FLOMAX ) 0.4 MG CAPS capsule TAKE 2 CAPSULES BY MOUTH DAILY AFTER SUPPER 07/23/22  Yes Maribeth Camellia MATSU, MD  traZODone  (DESYREL ) 100 MG tablet Take 200 mg by mouth at bedtime. 10/17/23  Yes [provider]  Trospium  Chloride 60 MG CP24 Take 1 capsule (60 mg total) by mouth daily. 04/02/23  Yes Francisca Redell BROCKS, MD  Continuous Glucose Receiver (FREESTYLE LIBRE 2 READER) DEVI Use to check glucose at least every 8 hours 04/10/23   Maribeth Camellia MATSU, MD  Continuous Glucose Sensor (FREESTYLE LIBRE 2 SENSOR) MISC Apply every 14 days to check glucose 04/10/23   Maribeth Camellia MATSU, MD  cyclobenzaprine  (FLEXERIL )  10 MG tablet Take 1 tablet (10 mg total) by mouth 3 (three) times daily as needed for muscle spasms. Patient not taking: Reported on 05/04/2024 07/02/23   Gretel App, NP  mupirocin ointment (BACTROBAN) 2 % Apply 1 Application topically 2 (two) times daily. Patient not taking: Reported on 05/04/2024 01/30/24   [provider]  vitamin C  (ASCORBIC ACID ) 500 MG tablet Take 1,000 mg by mouth daily. Patient not taking: Reported on 05/04/2024    [provider]    Physical Exam: Vitals:   05/04/24 0027 05/04/24 0300 05/04/24 0330 05/04/24 0601  BP: 117/61 103/61 131/69  Pulse: 77 64 65   Resp: 17 18    Temp: 98.2 F (36.8 C)   (!) 96.4 F (35.8 C)  TempSrc:    Axillary  SpO2: 98% 100% 96%   Weight:        Constitutional: NAD, calm, comfortable Vitals:   05/04/24 0027 05/04/24 0300 05/04/24 0330 05/04/24 0601  BP: 117/61 103/61 131/69   Pulse: 77 64 65   Resp: 17 18    Temp: 98.2 F (36.8 C)   (!) 96.4 F (35.8 C)  TempSrc:    Axillary  SpO2: 98% 100% 96%   Weight:       Eyes: PERRL, lids and conjunctivae normal ENMT: Mucous membranes are moist. Posterior pharynx clear of any exudate or lesions.Normal dentition.  Neck: normal, supple, no masses, no thyromegaly Respiratory: clear to auscultation bilaterally, no wheezing, no crackles. Normal respiratory effort. No accessory muscle use.  Cardiovascular: Regular rate and rhythm, no murmurs / rubs / gallops. No extremity edema. 2+ pedal pulses. No carotid bruits.  Abdomen: no tenderness, no masses palpated. No hepatosplenomegaly. Bowel sounds positive.  Musculoskeletal: no clubbing / cyanosis. No joint deformity upper and lower extremities. Good ROM, no contractures. Normal muscle tone.  Skin: no rashes, lesions, ulcers. No induration Neurologic: CN 2-12 grossly intact. Sensation intact, DTR normal. Strength 4/5 in all bilateral lower extremities, muscle strength 5/5 on bilateral upper extremities.  Psychiatric:  Normal judgment and insight. Alert and oriented to person and place, confused about time Labs on Admission: I have personally reviewed following labs and imaging studies  CBC: Recent Labs  Lab 05/04/24 0046  WBC 6.3  NEUTROABS 3.4  HGB 11.8*  HCT 36.1*  MCV 96.3  PLT 228   Basic Metabolic Panel: Recent Labs  Lab 05/04/24 0046  NA 135  K 4.6  CL 95*  CO2 32  GLUCOSE 138*  BUN 32*  CREATININE 1.54*  CALCIUM  10.0   GFR: Estimated Creatinine Clearance: 36.8 mL/min (A) (by C-G formula based on SCr of 1.54 mg/dL (H)). Liver Function Tests: Recent Labs  Lab 05/04/24 0046  AST 70*  ALT 32  ALKPHOS 110  BILITOT 0.4  PROT 6.4*  ALBUMIN 3.7   No results for input(s): LIPASE, AMYLASE in the last 168 hours. Recent Labs  Lab 05/04/24 0329  AMMONIA 39*   Coagulation Profile: No results for input(s): INR, PROTIME in the last 168 hours. Cardiac Enzymes: Recent Labs  Lab 05/04/24 0329  CKTOTAL 246   BNP (last 3 results) No results for input(s): PROBNP in the last 8760 hours. HbA1C: No results for input(s): HGBA1C in the last 72 hours. CBG: No results for input(s): GLUCAP in the last 168 hours. Lipid Profile: No results for input(s): CHOL, HDL, LDLCALC, TRIG, CHOLHDL, LDLDIRECT in the last 72 hours. Thyroid  Function Tests: No results for input(s): TSH, T4TOTAL, FREET4, T3FREE, THYROIDAB in the last 72 hours. Anemia Panel: No results for input(s): VITAMINB12, FOLATE, FERRITIN, TIBC, IRON, RETICCTPCT in the last 72 hours. Urine analysis:    Component Value Date/Time   COLORURINE YELLOW (A) 05/04/2024 0329   APPEARANCEUR CLEAR (A) 05/04/2024 0329   APPEARANCEUR Clear 07/14/2020 1322   LABSPEC 1.011 05/04/2024 0329   PHURINE 6.0 05/04/2024 0329   GLUCOSEU 50 (A) 05/04/2024 0329   HGBUR NEGATIVE 05/04/2024 0329   BILIRUBINUR NEGATIVE 05/04/2024 0329   BILIRUBINUR Negative 07/14/2020 1322   KETONESUR NEGATIVE  05/04/2024 0329   PROTEINUR NEGATIVE 05/04/2024 0329   UROBILINOGEN 0.2 07/20/2019 1608   NITRITE NEGATIVE  05/04/2024 0329   LEUKOCYTESUR NEGATIVE 05/04/2024 0329    Radiological Exams on Admission: MR BRAIN WO CONTRAST Result Date: 05/04/2024 EXAM: MRI BRAIN WITHOUT CONTRAST 05/04/2024 07:55:54 AM TECHNIQUE: Multiplanar multisequence MRI of the head/brain was performed without the administration of intravenous contrast. COMPARISON: Brain MRI 11/30/2023 and CT head 05/04/2024. CLINICAL HISTORY: 83 year old male with dementia and increasing weakness . FINDINGS: BRAIN AND VENTRICLES: Generalized cerebral volume loss with ex vacuo appearing ventricular enlargement has not significantly changed since July (series 17 image 19). Normal flow voids. No acute infarct. No intracranial hemorrhage. No mass. No midline shift. Mesial temporal lobe atrophy (image 17) but not clearly disproportionate. Tiny chronic right cerebellar infarct on series 8 image 6 is stable. No chronic cerebral blood products on SWI. Patchy chronic periventricular white matter T2 and FLAIR hyperintensity is stable. No convincing transependymal edema. No cortical encephalomalacia. Maintained deep gray matter nuclei and brainstem signal. The sella is unremarkable. ORBITS: No acute abnormality. SINUSES AND MASTOIDS: No acute abnormality. BONES AND SOFT TISSUES: Normal marrow signal. No acute soft tissue abnormality. Negative visible cervical spine. IMPRESSION: 1. No acute intracranial abnormality. 2. Stable chronic generalized cerebral volume loss with ex vacuo ventricular enlargement and minimal to mild changes of chronic small vessel disease. Electronically signed by: Helayne Hurst MD 05/04/2024 08:03 AM EST RP Workstation: [id number]   CT HEAD WO CONTRAST ( ) Result Date: 05/04/2024 EXAM: CT HEAD AND CERVICAL SPINE 05/04/2024 02:22:26 AM TECHNIQUE: CT of the head and cervical spine was performed without the administration of intravenous  contrast. Multiplanar reformatted images are provided for review. Automated exposure control, iterative reconstruction, and/or weight based adjustment of the mA/kV was utilized to reduce the radiation dose to as low as reasonably achievable. COMPARISON: Cervical spine CT 11/06/2023, and head CT 01/01/2024. CLINICAL HISTORY: Head trauma, minor (Age >= 65y). Frequent falls. FINDINGS: CT HEAD BRAIN AND VENTRICLES: No acute intracranial hemorrhage. No mass effect or midline shift. No abnormal extra-axial fluid collection. No evidence of acute infarct. There is moderately advanced cerebral atrophy, with atrophic ventriculomegaly and mild to moderate cerebellar atrophy. Relatively mild small vessel disease of the cerebral white matter. ORBITS: There are old lens replacements, otherwise negative orbits. SINUSES AND MASTOIDS: No acute abnormality. SOFT TISSUES AND SKULL: No acute skull fracture. No acute soft tissue abnormality. No depressed skull fracture. There is no visible scalp hematoma. VASCULATURE: The carotid siphons are moderately calcified. No hyperdense vessel is seen. CT CERVICAL SPINE BONES AND ALIGNMENT: There is osteopenia without evidence of fractures or focal destructive bone lesion. Bone-on-bone anterior atlantodental joint space loss and reactive osteophytes are again noted and there is peripheral interbody ankylosis and facet ankylosis of C2-C3. From C3-C4 through C7-T1, there is chronic minimal stepwise grade 1 anterolisthesis probably related to facet hypertrophy. No new or worsening alignment abnormality is seen. DEGENERATIVE CHANGES: There is mild disc space loss in the cervical spine, disc space calcifications. No disc herniation or cord compromise is seen. There is multilevel foraminal stenosis facet joint hypertrophy with minimal uncovertebral spurring. Tuv Acquired foraminal stenosis is most severe on the right at C3-C4 and on the right at C5-C6, left at C6-C7. SOFT TISSUES: No prevertebral soft  tissue swelling. There are calcific plaques in the proximal cervical ICAs. There is a 2.2 cm hypodense solid nodule in the right lobe of the thyroid , which was previously biopsied in 2019. No follow-up imaging is recommended unless otherwise indicated. There is no laryngeal mass. No appreciable canal hematoma. LUNG APICES: Lung apices are clear. IMPRESSION: 1.  No acute intracranial CT findings or depressed skull fractures. 2. No acute fracture or traumatic malalignment of the cervical spine. 3. Osteopenia and degenerative change of the cervical spine. Stable exam. 4. Moderately advanced cerebral atrophy with atrophic ventriculomegaly. Stable exam. Electronically signed by: Francis Quam MD 05/04/2024 02:52 AM EST RP Workstation: [id number]   CT Cervical Spine Wo Contrast Result Date: 05/04/2024 EXAM: CT HEAD AND CERVICAL SPINE 05/04/2024 02:22:26 AM TECHNIQUE: CT of the head and cervical spine was performed without the administration of intravenous contrast. Multiplanar reformatted images are provided for review. Automated exposure control, iterative reconstruction, and/or weight based adjustment of the mA/kV was utilized to reduce the radiation dose to as low as reasonably achievable. COMPARISON: Cervical spine CT 11/06/2023, and head CT 01/01/2024. CLINICAL HISTORY: Head trauma, minor (Age >= 65y). Frequent falls. FINDINGS: CT HEAD BRAIN AND VENTRICLES: No acute intracranial hemorrhage. No mass effect or midline shift. No abnormal extra-axial fluid collection. No evidence of acute infarct. There is moderately advanced cerebral atrophy, with atrophic ventriculomegaly and mild to moderate cerebellar atrophy. Relatively mild small vessel disease of the cerebral white matter. ORBITS: There are old lens replacements, otherwise negative orbits. SINUSES AND MASTOIDS: No acute abnormality. SOFT TISSUES AND SKULL: No acute skull fracture. No acute soft tissue abnormality. No depressed skull fracture. There is no  visible scalp hematoma. VASCULATURE: The carotid siphons are moderately calcified. No hyperdense vessel is seen. CT CERVICAL SPINE BONES AND ALIGNMENT: There is osteopenia without evidence of fractures or focal destructive bone lesion. Bone-on-bone anterior atlantodental joint space loss and reactive osteophytes are again noted and there is peripheral interbody ankylosis and facet ankylosis of C2-C3. From C3-C4 through C7-T1, there is chronic minimal stepwise grade 1 anterolisthesis probably related to facet hypertrophy. No new or worsening alignment abnormality is seen. DEGENERATIVE CHANGES: There is mild disc space loss in the cervical spine, disc space calcifications. No disc herniation or cord compromise is seen. There is multilevel foraminal stenosis facet joint hypertrophy with minimal uncovertebral spurring. Tuv Acquired foraminal stenosis is most severe on the right at C3-C4 and on the right at C5-C6, left at C6-C7. SOFT TISSUES: No prevertebral soft tissue swelling. There are calcific plaques in the proximal cervical ICAs. There is a 2.2 cm hypodense solid nodule in the right lobe of the thyroid , which was previously biopsied in 2019. No follow-up imaging is recommended unless otherwise indicated. There is no laryngeal mass. No appreciable canal hematoma. LUNG APICES: Lung apices are clear. IMPRESSION: 1. No acute intracranial CT findings or depressed skull fractures. 2. No acute fracture or traumatic malalignment of the cervical spine. 3. Osteopenia and degenerative change of the cervical spine. Stable exam. 4. Moderately advanced cerebral atrophy with atrophic ventriculomegaly. Stable exam. Electronically signed by: Francis Quam MD 05/04/2024 02:52 AM EST RP Workstation: [id number]   DG Chest Portable 1 View Result Date: 05/04/2024 EXAM: 1 VIEW(S) XRAY OF THE CHEST 05/04/2024 01:17:00 AM COMPARISON: 11/29/2023 CLINICAL HISTORY: sob FINDINGS: LUNGS AND PLEURA: Moderate right basilar atelectasis is  noted. No pleural effusion. No pneumothorax. HEART AND MEDIASTINUM: Atherosclerotic plaque. No acute abnormality of the cardiac and mediastinal silhouettes. BONES AND SOFT TISSUES: No acute osseous abnormality. IMPRESSION: 1. Moderate right basilar atelectasis, which may account for dyspnea. Electronically signed by: Oneil Devonshire MD 05/04/2024 01:27 AM EST RP Workstation: [id number]   DG Wrist Complete Left Result Date: 05/04/2024 EXAM: 3 or more VIEW(S) XRAY OF THE LEFT WRIST 05/04/2024 01:17:00 AM COMPARISON: None available. CLINICAL HISTORY: Weakness FINDINGS: BONES AND JOINTS: Acute  nondisplaced extra-articular fracture at the base of the proximal phalanx of the thumb. SOFT TISSUES: The soft tissues are unremarkable. IMPRESSION: 1. Acute nondisplaced extra-articular fracture at the base of the proximal phalanx of the thumb. Electronically signed by: Oneil Devonshire MD 05/04/2024 01:23 AM EST RP Workstation: [id number]   DG Pelvis Portable Result Date: 05/04/2024 EXAM: 1 or 2 VIEW(S) XRAY OF THE PELVIS 05/04/2024 01:17:00 AM COMPARISON: 03/19/2014 CLINICAL HISTORY: SOB, weakness. FINDINGS: BONES AND JOINTS: No acute fracture. No malalignment. Severe degenerative changes of the lower lumbar spine. SOFT TISSUES: Aortic atherosclerosis. IMPRESSION: 1. No acute findings. 2. Severe degenerative changes of the lower lumbar spine. Electronically signed by: Oneil Devonshire MD 05/04/2024 01:21 AM EST RP Workstation: [id number]    EKG: Independently reviewed.  Sinus rhythm, chronic RBBB, no acute ST changes.  Assessment/Plan Principal Problem:   Recurrent falls Active Problems:   AMS (altered mental status)  (please populate well all problems here in Problem List. (For example, if patient is on BP meds at home and you resume or decide to hold them, it is a problem that needs to be her. Same for CAD, COPD, HLD and so on)  Acute on chronic ambulatory impairment Recurrent falls -Strongly suspect polypharmacy,  with a baseline peripheral neuropathy, on multiple CNS medications for restless leg syndrome, anxiety/depression, Depakote . - Cut down gabapentin  from 600 to 300 mg 3 times daily - Hold off Aricept  and memantine  -Cut down Seroquel  dosage from 75 to 50 mg at bedtime -Hold off Flexeril  -Cut down Cymbalta  dosage from 60 mg to 30 mg daily -Other DDx, MRI is pending to rule out stroke.  Clinically patient's weakness is bilateral, low suspicion for stroke.  UA negative for UTI.  Ammonia level slightly elevated was considered to be related to Depakote  use. -PT OT evaluation  Probably acute metabolic encephalopathy Secondary to  CO2 narcotics Compensated, likely chronic respiratory acidosis and metabolic alkalosis -No history of COPD, VBG showed compensated chronic respiratory acidosis -Brief BiPAP to bring down pCO2, recheck VBG in 2 hours -Diamox  x 1 - X-ray showed bilateral basilar atelectasis.  Unintentional weight loss Subacute-chronic cough -Somewhat suspect dysphagia however patient denied. -Consult speech therapist - Other DDx, check respiratory viral panel.  Carboxyhemoglobin level and methemoglobin level within normal limits  History of Mobitz type I second-degree AV block - EKG this time showed first-degree AV block - Plan to continue beta-blocker  HTN - Controlled, continue beta-blocker and lisinopril  - Discontinue HCTZ due to CKD  CKD stage IIIb - Euvolemic, creatinine level stable -   1 dose of Diamox  given to address CO2 retention.  IIDM -Plan to hold off his PO DM meds for now -SSI -Check A1C to rule out over stringent glucose control  Anxiety/depression Diabetic neuropathy -Medication adjustment as above   DVT prophylaxis: Heparin  subcu Code Status: DNR/DNI Family Communication: Son at bedside. Wife will bring in medications later today Disposition Plan: Patient is sick with frequent falls at home and confusion, likely polypharmacy requiring carefully  adjustment of his CNS medications and monitor clinical progress, expect more than 2 meals per day Consults called: None Admission status: PCU admit.   Cort ONEIDA Mana MD [facility] Pager [phone]  05/04/2024, 8:12 AM        [1] No Known Allergies

## 2024-05-05 ENCOUNTER — Other Ambulatory Visit: Payer: Self-pay

## 2024-05-05 DIAGNOSIS — E43 Unspecified severe protein-calorie malnutrition: Secondary | ICD-10-CM | POA: Insufficient documentation

## 2024-05-05 LAB — BLOOD GAS, VENOUS
Acid-Base Excess: 6.2 mmol/L — ABNORMAL HIGH (ref 0.0–2.0)
Bicarbonate: 32.7 mmol/L — ABNORMAL HIGH (ref 20.0–28.0)
O2 Saturation: 77.9 %
Patient temperature: 37
pCO2, Ven: 54 mmHg (ref 44–60)
pH, Ven: 7.39 (ref 7.25–7.43)
pO2, Ven: 46 mmHg — ABNORMAL HIGH (ref 32–45)

## 2024-05-05 LAB — GLUCOSE, CAPILLARY
Glucose-Capillary: 129 mg/dL — ABNORMAL HIGH (ref 70–99)
Glucose-Capillary: 217 mg/dL — ABNORMAL HIGH (ref 70–99)
Glucose-Capillary: 189 mg/dL — ABNORMAL HIGH (ref 70–99)

## 2024-05-05 LAB — BASIC METABOLIC PANEL WITH GFR
Anion gap: 9 (ref 5–15)
BUN: 22 mg/dL (ref 8–23)
CO2: 27 mmol/L (ref 22–32)
Calcium: 9.5 mg/dL (ref 8.9–10.3)
Chloride: 98 mmol/L (ref 98–111)
Creatinine, Ser: 1.34 mg/dL — ABNORMAL HIGH (ref 0.61–1.24)
GFR, Estimated: 52 mL/min — ABNORMAL LOW (ref >60)
Glucose, Bld: 122 mg/dL — ABNORMAL HIGH (ref 70–99)
Potassium: 4 mmol/L (ref 3.5–5.1)
Sodium: 135 mmol/L (ref 135–145)

## 2024-05-05 MED ORDER — ADULT MULTIVITAMIN W/MINERALS CH
1.0000 | ORAL_TABLET | Freq: Every day | ORAL | Status: DC
  Administered 2024-05-05 – 2024-05-07 (×3): 1 via ORAL
  Filled 2024-05-05 (×3): qty 1

## 2024-05-05 MED ORDER — ENSURE PLUS HIGH PROTEIN PO LIQD
237.0000 mL | Freq: Three times a day (TID) | ORAL | Status: DC
  Administered 2024-05-05 – 2024-05-07 (×4): 237 mL via ORAL

## 2024-05-05 NOTE — Plan of Care (Signed)

## 2024-05-05 NOTE — Progress Notes (Signed)
 Occupational Therapy Treatment Patient Details Name: James Hood MRN: [id number] DOB: 04-09-1941 Today's Date: 05/05/2024   History of present illness Pt is an 83 y/o M admitted on 05/04/24 after presenting with c/o generalized weakness & falls. Brain MRI negative. X-ray showed B basilar atelectasis. PMH: dementia, DM2, neuropathy, RLS, anxiety, depression, hx of alcoholism, HTN, hyperhtyroidism, essential tremor   OT comments  Pt is supine in bed on arrival. Confused, but agreeable to OT session. He does not c/o pain. Requires increased time and 1 step directions. Max A required for bed mobility this date with hand over hand assist for proper technique/sequencing. Mod progressing to Min A for seated balance with increased shakiness and posterior lean requiring increased time to achieve balance. Pt performed 2 bouts of standing from EOB to RW with Max/Mod A required with constant cues for hand placement as he tries to pull up on RW. Noted with bil knee buckling and unsafe to progress away from the bed at this time. Tolerated ~30 seconds standing prior to needing to return to sitting. Max A to lateral scoot to [facility] and supervision to scoot to [facility] with cues and use of bed rail. Bed left in chair position. Pt demo face washing using RUE at bed level. Pt left with all needs in place and will cont to require skilled acute OT services to maximize his safety and IND to return to PLOF.   Be d     If plan is discharge home, recommend the following:  A lot of help with walking and/or transfers;A lot of help with bathing/dressing/bathroom;Help with stairs or ramp for entrance;Assistance with cooking/housework   Equipment Recommendations  Other (comment) (defer to next  venue)    Recommendations for Other Services      Precautions / Restrictions Precautions Precautions: Fall Recall of Precautions/Restrictions: Impaired Restrictions Weight Bearing Restrictions Per Provider Order: No        Mobility Bed Mobility Overal bed mobility: Needs Assistance Bed Mobility: Supine to Sit, Sit to Supine     Supine to sit: Max assist, Used rails, HOB elevated Sit to supine: Max assist   General bed mobility comments: increased time, effort and assist required this date with increased difficulty following commands, continued posterior lean and difficulty holding bed to reposition and gain balance; Max A to lateral scoot towards HOB and able to pull using RUE on bed rail to get to [facility] with supervision and cues    Transfers Overall transfer level: Needs assistance Equipment used: Rolling walker (2 wheels) Transfers: Sit to/from Stand Sit to Stand: Max assist, Mod assist           General transfer comment: 2 standing bouts from EOB requiring cues for hand placement as pt continues to pull up on RW; lift off assist required and noted with bil knee buckling, did not progress mobility away from the bed; pt tolerated ~30 seconds standing both trials     Balance Overall balance assessment: Needs assistance Sitting-balance support: Bilateral upper extremity supported, Feet supported Sitting balance-Leahy Scale: Poor     Standing balance support: Reliant on assistive device for balance, Bilateral upper extremity supported Standing balance-Leahy Scale: Poor Standing balance comment: external support at all times and use of RW with heavy UE reliance                           ADL either performed or assessed with clinical judgement   ADL Overall ADL's : Needs  assistance/impaired     Grooming: Set up;Wash/dry face;Bed level Grooming Details (indicate cue type and reason): bed in chair information                               General ADL Comments: notable shakiness continues in BUEs, able to wash face with setup assist using R hand at bed level    Extremity/Trunk Assessment              Vision       Perception     Praxis     Communication  Communication Communication: Impaired Factors Affecting Communication: Difficulty expressing self (intermittent)   Cognition Arousal: Alert Behavior During Therapy: Impulsive                                 Following commands: Impaired Following commands impaired: Follows one step commands with increased time      Cueing   Cueing Techniques: Verbal cues, Gestural cues, Tactile cues  Exercises      Shoulder Instructions       General Comments      Pertinent Vitals/ Pain       Pain Assessment Pain Assessment: Faces Faces Pain Scale: No hurt Pain Intervention(s): Monitored during session  Home Living                                          Prior Functioning/Environment              Frequency  Min 2X/week        Progress Toward Goals  OT Goals(current goals can now be found in the care plan section)  Progress towards OT goals: Progressing toward goals  Acute Rehab OT Goals Patient Stated Goal: get better OT Goal Formulation: With patient/family Time For Goal Achievement: 05/18/24 Potential to Achieve Goals: Fair  Plan      Co-evaluation                 AM-PAC OT 6 Clicks Daily Activity     Outcome Measure   Help from another person eating meals?: A Little Help from another person taking care of personal grooming?: A Little Help from another person toileting, which includes using toliet, bedpan, or urinal?: A Lot Help from another person bathing (including washing, rinsing, drying)?: A Lot Help from another person to put on and taking off regular upper body clothing?: A Lot Help from another person to put on and taking off regular lower body clothing?: A Lot 6 Click Score: 14    End of Session Equipment Utilized During Treatment: Rolling walker (2 wheels);Gait belt  OT Visit Diagnosis: Other abnormalities of gait and mobility (R26.89);Muscle weakness (generalized) (M62.81);Unsteadiness on feet  (R26.81);Repeated falls (R29.6)   Activity Tolerance Patient tolerated treatment well   Patient Left in bed;with call bell/phone within reach;with bed alarm set   Nurse Communication Mobility status        Time: 8458-8440 OT Time Calculation (min): 18 min  Charges: OT General Charges $OT Visit: 1 Visit OT Treatments $Therapeutic Activity: 8-22 mins  Josephanthony Tindel Chrismon, OTR/L  05/05/2024, 4:33 PM   Adeoluwa Silvers E Chrismon 05/05/2024, 4:29 PM

## 2024-05-05 NOTE — TOC Progression Note (Signed)
 Transition of Care [facility]) - Progression Note    Patient Details  Name: James Hood MRN: [id number] Date of Birth: [birth year]  Transition of Care [facility]) CM/SW Contact  Alfonso Rummer, LCSW Phone Number: 05/05/2024, 4:36 PM  Clinical Narrative:     KEN DELENA Rummer contacted pt spouse via phone to advise of snf offers. Pt spouse asked LCSW A Naisha Wisdom to text message her the options therefore family can research.   Expected Discharge Plan: Skilled Nursing Facility Barriers to Discharge: Continued Medical Work up               Expected Discharge Plan and Services   Discharge Planning Services: CM Consult   Living arrangements for the past 2 months: Mobile Home (Travel trailer)                                       Social Drivers of Health (SDOH) Interventions SDOH Screenings   Food Insecurity: No Food Insecurity (05/04/2024)  Housing: Low Risk (05/04/2024)  Transportation Needs: No Transportation Needs (05/05/2024)  Utilities: Not At Risk (05/05/2024)  Alcohol  Screen: Low Risk (12/31/2023)  Depression (PHQ2-9): Low Risk (01/01/2024)  Financial Resource Strain: Medium Risk (12/31/2023)  Physical Activity: Insufficiently Active (12/31/2023)  Social Connections: Socially Integrated (05/05/2024)  Stress: No Stress Concern Present (12/31/2023)  Tobacco Use: High Risk (05/04/2024)  Health Literacy: Inadequate Health Literacy (01/01/2024)    Readmission Risk Interventions    05/04/2024   12:18 PM  Readmission Risk Prevention Plan  Transportation Screening Complete  Medication Review (RN Care Manager) Complete  PCP or Specialist appointment within 3-5 days of discharge Complete  HRI or Home Care Consult Complete  SW Recovery Care/Counseling Consult Not Complete  SW Consult Not Complete Comments NA  Palliative Care Screening Not Applicable  Skilled Nursing Facility Complete

## 2024-05-05 NOTE — Plan of Care (Signed)
 Patient remained stable this shift. Family at bedside. Plan for short term rehab. Mentation waxes and wanes. Needs reorientation.  Problem: Education: Goal: Ability to describe self-care measures that may prevent or decrease complications (Diabetes Survival Skills Education) will improve Outcome: Progressing Goal: Individualized Educational Video(s) Outcome: Progressing   Problem: Coping: Goal: Ability to adjust to condition or change in health will improve Outcome: Progressing   Problem: Fluid Volume: Goal: Ability to maintain a balanced intake and output will improve Outcome: Progressing   Problem: Health Behavior/Discharge Planning: Goal: Ability to identify and utilize available resources and services will improve Outcome: Progressing Goal: Ability to manage health-related needs will improve Outcome: Progressing   Problem: Metabolic: Goal: Ability to maintain appropriate glucose levels will improve Outcome: Progressing   Problem: Nutritional: Goal: Maintenance of adequate nutrition will improve Outcome: Progressing Goal: Progress toward achieving an optimal weight will improve Outcome: Progressing   Problem: Skin Integrity: Goal: Risk for impaired skin integrity will decrease Outcome: Progressing   Problem: Tissue Perfusion: Goal: Adequacy of tissue perfusion will improve Outcome: Progressing   Problem: Education: Goal: Knowledge of General Education information will improve Description: Including pain rating scale, medication(s)/side effects and non-pharmacologic comfort measures Outcome: Progressing   Problem: Health Behavior/Discharge Planning: Goal: Ability to manage health-related needs will improve Outcome: Progressing   Problem: Clinical Measurements: Goal: Ability to maintain clinical measurements within normal limits will improve Outcome: Progressing Goal: Will remain free from infection Outcome: Progressing Goal: Diagnostic test results will  improve Outcome: Progressing Goal: Respiratory complications will improve Outcome: Progressing Goal: Cardiovascular complication will be avoided Outcome: Progressing   Problem: Activity: Goal: Risk for activity intolerance will decrease Outcome: Progressing   Problem: Nutrition: Goal: Adequate nutrition will be maintained Outcome: Progressing   Problem: Coping: Goal: Level of anxiety will decrease Outcome: Progressing   Problem: Elimination: Goal: Will not experience complications related to bowel motility Outcome: Progressing Goal: Will not experience complications related to urinary retention Outcome: Progressing   Problem: Pain Managment: Goal: General experience of comfort will improve and/or be controlled Outcome: Progressing   Problem: Safety: Goal: Ability to remain free from injury will improve Outcome: Progressing   Problem: Skin Integrity: Goal: Risk for impaired skin integrity will decrease Outcome: Progressing
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
## 2024-05-05 NOTE — Progress Notes (Signed)
 PROGRESS NOTE    James Hood   FMW:[id number] DOB: [birth year]  DOA: 05/04/2024 Date of Service: 05/05/2024 which is hospital day 1  PCP: Gretel App, NP    Hospital course / significant events:   HPI: James Hood is a 83 y.o. male with medical history significant of advanced dementia, anxiety/depression, BPH, HTN, HLD, CKD stage IIIb, second-degree Mobitz type I heart block, recurrent falls and balance issue, IIDM, diabetic neuropathy, RLS, presented with frequent falls and increasing confusion at home. Patient lives with his wife at home, has been using roller walker to ambulate since last year.  This time, patient reported that he has been feeling weak on both of his legs and both feet for several weeks and reportedly he sustained multiple falls in last 24 hours.  Patient complaining about weakness with up to the level below bilateral knees.  Patient also reported that he has been having a dry cough for  few months, and appears that he lost about 15 to 20 pounds in last 3 months.   12/15: to ED. Trauma scan showed left nondisplaced extra-articular fracture at the base of proximal phalanx of the thumb, CT head showed brain atrophy.  12/16:      Consultants:  none  Procedures/Surgeries: none      ASSESSMENT & PLAN:   Acute on chronic ambulatory impairment Recurrent falls DDx: CO2 narcosis; infection; malnourishment; polypharmacy, with a baseline peripheral neuropathy, on multiple CNS medications for restless leg syndrome, anxiety/depression, Depakote ; arrhythmia but no syncope so doubt; hypoglycemia  PT/OT recs for SNF rehab, placement pending  Treat underlying cause(s) as below  Polypharmacy Reduce gabapentin  from 600 to 300 mg 3 times daily Reduce Cymbalta  dosage from 60 mg to 30 mg daily Reduce Seroquel  dosage from 75 to 50 mg at bedtime DC trazodone   DC Flexeril   DC Aricept  and memantine  Note depakote   level low - continue  Continue primidone     Mild hyperammonemia 39 Mild transaminitis AST 70 Trend   Question CVA - ruled out MRI pending on admission --> non-acute    Question CO2 narcosis  Question underlying hypoventilatory problem, possible COPD - Compensated, likely chronic respiratory acidosis and metabolic alkalosis based on VBG Brief BiPAP to bring down pCO2, recheck VBG in 2 hours Diamox  x 1 X-ray showed bilateral basilar atelectasis.   Other Ddx general weakness / encephalopathy: Infection reasonably excluded  No significant anemia  CK WNL  Unintentional weight loss Subacute-chronic cough Somewhat suspect dysphagia however patient denied. Consult speech therapist --> mild aspiration risk  Consult dietician    History of Mobitz type I second-degree AV block EKG this time showed first-degree AV block Plan to continue beta-blocker   HTN Controlled continue beta-blocker and lisinopril  Discontinue HCTZ due to CKD   CKD stage IIIb Euvolemic, creatinine level stable 1 dose of Diamox  given to address CO2 retention. Monitor BMP   IIDM A1C 7.5, at goal for age  hold off his PO DM meds for now SSI   Anxiety/depression Diabetic neuropathy Medication adjustment as above  HLD Statin  BPH Flomax    No concerns based on BMI: Body mass index is 23.12 kg/m.SABRA Significantly low or high BMI is associated with higher medical risk.  Underweight - under 18  overweight - 25 to 29 obese - 30 or more Class 1 obesity: BMI of 30.0 to 34 Class 2 obesity: BMI of 35.0 to 39 Class 3 obesity: BMI of 40.0 to 49 Super Morbid Obesity: BMI 50-59 Super-super Morbid Obesity: BMI 60+  Healthy nutrition and physical activity advised as adjunct to other disease management and risk reduction treatments    DVT prophylaxis: heparin  IV fluids: no continuous IV fluids  Nutrition: regular Central lines / other devices: none  Code Status: DNR ACP documentation reviewed: has advanced directiveon file in [facility]  [facility] needs:  SNF placement Medical barriers to dispo: none. Expected medical readiness for discharge now.              Subjective / Brief ROS:  Patient reports feels fine today Denies CP/SOB.  Pain controlled.  Denies new weakness.  Tolerating diet.    Family Communication: son at bedside on rounds     Objective Findings:  Vitals:   05/05/24 0430 05/05/24 0817 05/05/24 1205 05/05/24 1613  BP: 92/68 103/77 104/64 (!) 105/58  Pulse: 63 76 88 70  Resp: 17 19 19 19   Temp: 98.3 F (36.8 C) 98.5 F (36.9 C) 97.9 F (36.6 C) 98.2 F (36.8 C)  TempSrc:  Oral Oral   SpO2: 90% 95% 94% 100%  Weight:      Height:        Intake/Output Summary (Last 24 hours) at 05/05/2024 1718 Last data filed at 05/05/2024 1300 Gross per 24 hour  Intake 360 ml  Output --  Net 360 ml   Filed Weights   05/04/24 0026 05/04/24 2039  Weight: 72.8 kg 73.1 kg    Examination:  Physical Exam Constitutional:      General: He is not in acute distress. Cardiovascular:     Rate and Rhythm: Normal rate and regular rhythm.  Pulmonary:     Effort: Pulmonary effort is normal.     Breath sounds: Normal breath sounds.  Abdominal:     Palpations: Abdomen is soft.  Musculoskeletal:     Right lower leg: No edema.     Left lower leg: No edema.  Neurological:     Mental Status: He is alert. Mental status is at baseline.  Psychiatric:        Mood and Affect: Mood normal.        Behavior: Behavior normal.          Scheduled Medications:   aspirin  EC  81 mg Oral Daily   buPROPion   150 mg Oral Daily   divalproex   500 mg Oral BID   DULoxetine   30 mg Oral Daily   feeding supplement  237 mL Oral TID BM   fenofibrate   160 mg Oral Daily   gabapentin   300 mg Oral TID   heparin   5,000 Units Subcutaneous Q12H   insulin  aspart  0-9 Units Subcutaneous TID WC   lisinopril   20 mg Oral Daily   metoprolol  succinate  25 mg Oral Daily   multivitamin with minerals  1 tablet Oral Daily   pantoprazole   40 mg  Oral Daily   pramipexole   0.125 mg Oral QHS   primidone   250 mg Oral Daily   And   primidone   150 mg Oral QHS   QUEtiapine   50 mg Oral QHS   rosuvastatin   40 mg Oral Daily   tamsulosin   0.4 mg Oral QPC supper    Continuous Infusions:   PRN Medications:  acetaminophen  **OR** acetaminophen , metoCLOPramide  (REGLAN ) injection  Antimicrobials from admission:  Anti-infectives (From admission, onward)    None           Data Reviewed:  I have personally reviewed the following...  CBC: Recent Labs  Lab 05/04/24 0046  WBC 6.3  NEUTROABS 3.4  HGB 11.8*  HCT 36.1*  MCV 96.3  PLT 228   Basic Metabolic Panel: Recent Labs  Lab 05/04/24 0046 05/05/24 0831  NA 135 135  K 4.6 4.0  CL 95* 98  CO2 32 27  GLUCOSE 138* 122*  BUN 32* 22  CREATININE 1.54* 1.34*  CALCIUM  10.0 9.5   GFR: Estimated Creatinine Clearance: 42.4 mL/min (A) (by C-G formula based on SCr of 1.34 mg/dL (H)). Liver Function Tests: Recent Labs  Lab 05/04/24 0046  AST 70*  ALT 32  ALKPHOS 110  BILITOT 0.4  PROT 6.4*  ALBUMIN 3.7   No results for input(s): LIPASE, AMYLASE in the last 168 hours. Recent Labs  Lab 05/04/24 0329  AMMONIA 39*   Coagulation Profile: No results for input(s): INR, PROTIME in the last 168 hours. Cardiac Enzymes: Recent Labs  Lab 05/04/24 0329  CKTOTAL 246   BNP (last 3 results) No results for input(s): PROBNP in the last 8760 hours. HbA1C: Recent Labs    05/04/24 0329  HGBA1C 7.5*   CBG: Recent Labs  Lab 05/04/24 1706 05/04/24 2055 05/05/24 0820 05/05/24 1208 05/05/24 1615  GLUCAP 128* 120* 129* 217* 189*   Lipid Profile: No results for input(s): CHOL, HDL, LDLCALC, TRIG, CHOLHDL, LDLDIRECT in the last 72 hours. Thyroid  Function Tests: No results for input(s): TSH, T4TOTAL, FREET4, T3FREE, THYROIDAB in the last 72 hours. Anemia Panel: No results for input(s): VITAMINB12, FOLATE, FERRITIN, TIBC, IRON,  RETICCTPCT in the last 72 hours. Most Recent Urinalysis On File:     Component Value Date/Time   COLORURINE YELLOW (A) 05/04/2024 0329   APPEARANCEUR CLEAR (A) 05/04/2024 0329   APPEARANCEUR Clear 07/14/2020 1322   LABSPEC 1.011 05/04/2024 0329   PHURINE 6.0 05/04/2024 0329   GLUCOSEU 50 (A) 05/04/2024 0329   HGBUR NEGATIVE 05/04/2024 0329   BILIRUBINUR NEGATIVE 05/04/2024 0329   BILIRUBINUR Negative 07/14/2020 1322   KETONESUR NEGATIVE 05/04/2024 0329   PROTEINUR NEGATIVE 05/04/2024 0329   UROBILINOGEN 0.2 07/20/2019 1608   NITRITE NEGATIVE 05/04/2024 0329   LEUKOCYTESUR NEGATIVE 05/04/2024 0329   Sepsis Labs: @LABRCNTIP (procalcitonin:4,lacticidven:4) Microbiology: Recent Results (from the past 240 hours)  Resp panel by RT-PCR (RSV, Flu A&B, Covid) Anterior Nasal Swab     Status: None   Collection Time: 05/04/24 12:46 AM   Specimen: Anterior Nasal Swab  Result Value Ref Range Status   SARS Coronavirus 2 by RT PCR NEGATIVE NEGATIVE Final    Comment: (NOTE) SARS-CoV-2 target nucleic acids are NOT DETECTED.  The SARS-CoV-2 RNA is generally detectable in upper respiratory specimens during the acute phase of infection. The lowest concentration of SARS-CoV-2 viral copies this assay can detect is 138 copies/mL. A negative result does not preclude SARS-Cov-2 infection and should not be used as the sole basis for treatment or other patient management decisions. A negative result may occur with  improper specimen collection/handling, submission of specimen other than nasopharyngeal swab, presence of viral mutation(s) within the areas targeted by this assay, and inadequate number of viral copies(<138 copies/mL). A negative result must be combined with clinical observations, patient history, and epidemiological information. The expected result is Negative.  Fact Sheet for Patients:  bloggercourse.com  Fact Sheet for Healthcare Providers:   seriousbroker.it  This test is no t yet approved or cleared by the [place]  FDA and  has been authorized for detection and/or diagnosis of SARS-CoV-2 by FDA under an Emergency Use Authorization (EUA). This EUA will remain  in effect (meaning this test can be used) for  the duration of the COVID-19 declaration under Section 564(b)(1) of the Act, 21 U.S.C.section 360bbb-3(b)(1), unless the authorization is terminated  or revoked sooner.       Influenza A by PCR NEGATIVE NEGATIVE Final   Influenza B by PCR NEGATIVE NEGATIVE Final    Comment: (NOTE) The Xpert Xpress SARS-CoV-2/FLU/RSV plus assay is intended as an aid in the diagnosis of influenza from Nasopharyngeal swab specimens and should not be used as a sole basis for treatment. Nasal washings and aspirates are unacceptable for Xpert Xpress SARS-CoV-2/FLU/RSV testing.  Fact Sheet for Patients: bloggercourse.com  Fact Sheet for Healthcare Providers: seriousbroker.it  This test is not yet approved or cleared by the [place]  FDA and has been authorized for detection and/or diagnosis of SARS-CoV-2 by FDA under an Emergency Use Authorization (EUA). This EUA will remain in effect (meaning this test can be used) for the duration of the COVID-19 declaration under Section 564(b)(1) of the Act, 21 U.S.C. section 360bbb-3(b)(1), unless the authorization is terminated or revoked.     Resp Syncytial Virus by PCR NEGATIVE NEGATIVE Final    Comment: (NOTE) Fact Sheet for Patients: bloggercourse.com  Fact Sheet for Healthcare Providers: seriousbroker.it  This test is not yet approved or cleared by the [place]  FDA and has been authorized for detection and/or diagnosis of SARS-CoV-2 by FDA under an Emergency Use Authorization (EUA). This EUA will remain in effect (meaning this test can be used) for  the duration of the COVID-19 declaration under Section 564(b)(1) of the Act, 21 U.S.C. section 360bbb-3(b)(1), unless the authorization is terminated or revoked.  Performed at [facility], [address] [place], [place] [address]   Respiratory (~20 pathogens) panel by PCR     Status: None   Collection Time: 05/04/24  9:57 AM   Specimen: Nasopharyngeal Swab; Respiratory  Result Value Ref Range Status   Adenovirus NOT DETECTED NOT DETECTED Final   Coronavirus 229E NOT DETECTED NOT DETECTED Final    Comment: (NOTE) The Coronavirus on the Respiratory Panel, DOES NOT test for the novel  Coronavirus (2019 nCoV)    Coronavirus HKU1 NOT DETECTED NOT DETECTED Final   Coronavirus NL63 NOT DETECTED NOT DETECTED Final   Coronavirus OC43 NOT DETECTED NOT DETECTED Final   Metapneumovirus NOT DETECTED NOT DETECTED Final   Rhinovirus / Enterovirus NOT DETECTED NOT DETECTED Final   Influenza A NOT DETECTED NOT DETECTED Final   Influenza B NOT DETECTED NOT DETECTED Final   Parainfluenza Virus 1 NOT DETECTED NOT DETECTED Final   Parainfluenza Virus 2 NOT DETECTED NOT DETECTED Final   Parainfluenza Virus 3 NOT DETECTED NOT DETECTED Final   Parainfluenza Virus 4 NOT DETECTED NOT DETECTED Final   Respiratory Syncytial Virus NOT DETECTED NOT DETECTED Final   Bordetella pertussis NOT DETECTED NOT DETECTED Final   Bordetella Parapertussis NOT DETECTED NOT DETECTED Final   Chlamydophila pneumoniae NOT DETECTED NOT DETECTED Final   Mycoplasma pneumoniae NOT DETECTED NOT DETECTED Final    Comment: Performed at [facility], [address], [place], [place] [address]      Radiology Studies last 3 days: MR BRAIN WO CONTRAST Result Date: 05/04/2024 EXAM: MRI BRAIN WITHOUT CONTRAST 05/04/2024 07:55:54 AM TECHNIQUE: Multiplanar multisequence MRI of the head/brain was performed without the administration of intravenous contrast. COMPARISON: Brain MRI 11/30/2023 and CT head 05/04/2024.  CLINICAL HISTORY: 83 year old male with dementia and increasing weakness . FINDINGS: BRAIN AND VENTRICLES: Generalized cerebral volume loss with ex vacuo appearing ventricular enlargement has not significantly  changed since July (series 17 image 19). Normal flow voids. No acute infarct. No intracranial hemorrhage. No mass. No midline shift. Mesial temporal lobe atrophy (image 17) but not clearly disproportionate. Tiny chronic right cerebellar infarct on series 8 image 6 is stable. No chronic cerebral blood products on SWI. Patchy chronic periventricular white matter T2 and FLAIR hyperintensity is stable. No convincing transependymal edema. No cortical encephalomalacia. Maintained deep gray matter nuclei and brainstem signal. The sella is unremarkable. ORBITS: No acute abnormality. SINUSES AND MASTOIDS: No acute abnormality. BONES AND SOFT TISSUES: Normal marrow signal. No acute soft tissue abnormality. Negative visible cervical spine. IMPRESSION: 1. No acute intracranial abnormality. 2. Stable chronic generalized cerebral volume loss with ex vacuo ventricular enlargement and minimal to mild changes of chronic small vessel disease. Electronically signed by: Helayne Hurst MD 05/04/2024 08:03 AM EST RP Workstation: [id number]   CT HEAD WO CONTRAST ( ) Result Date: 05/04/2024 EXAM: CT HEAD AND CERVICAL SPINE 05/04/2024 02:22:26 AM TECHNIQUE: CT of the head and cervical spine was performed without the administration of intravenous contrast. Multiplanar reformatted images are provided for review. Automated exposure control, iterative reconstruction, and/or weight based adjustment of the mA/kV was utilized to reduce the radiation dose to as low as reasonably achievable. COMPARISON: Cervical spine CT 11/06/2023, and head CT 01/01/2024. CLINICAL HISTORY: Head trauma, minor (Age >= 65y). Frequent falls. FINDINGS: CT HEAD BRAIN AND VENTRICLES: No acute intracranial hemorrhage. No mass effect or midline shift. No abnormal  extra-axial fluid collection. No evidence of acute infarct. There is moderately advanced cerebral atrophy, with atrophic ventriculomegaly and mild to moderate cerebellar atrophy. Relatively mild small vessel disease of the cerebral white matter. ORBITS: There are old lens replacements, otherwise negative orbits. SINUSES AND MASTOIDS: No acute abnormality. SOFT TISSUES AND SKULL: No acute skull fracture. No acute soft tissue abnormality. No depressed skull fracture. There is no visible scalp hematoma. VASCULATURE: The carotid siphons are moderately calcified. No hyperdense vessel is seen. CT CERVICAL SPINE BONES AND ALIGNMENT: There is osteopenia without evidence of fractures or focal destructive bone lesion. Bone-on-bone anterior atlantodental joint space loss and reactive osteophytes are again noted and there is peripheral interbody ankylosis and facet ankylosis of C2-C3. From C3-C4 through C7-T1, there is chronic minimal stepwise grade 1 anterolisthesis probably related to facet hypertrophy. No new or worsening alignment abnormality is seen. DEGENERATIVE CHANGES: There is mild disc space loss in the cervical spine, disc space calcifications. No disc herniation or cord compromise is seen. There is multilevel foraminal stenosis facet joint hypertrophy with minimal uncovertebral spurring. Tuv Acquired foraminal stenosis is most severe on the right at C3-C4 and on the right at C5-C6, left at C6-C7. SOFT TISSUES: No prevertebral soft tissue swelling. There are calcific plaques in the proximal cervical ICAs. There is a 2.2 cm hypodense solid nodule in the right lobe of the thyroid , which was previously biopsied in 2019. No follow-up imaging is recommended unless otherwise indicated. There is no laryngeal mass. No appreciable canal hematoma. LUNG APICES: Lung apices are clear. IMPRESSION: 1. No acute intracranial CT findings or depressed skull fractures. 2. No acute fracture or traumatic malalignment of the cervical  spine. 3. Osteopenia and degenerative change of the cervical spine. Stable exam. 4. Moderately advanced cerebral atrophy with atrophic ventriculomegaly. Stable exam. Electronically signed by: Francis Quam MD 05/04/2024 02:52 AM EST RP Workstation: [id number]   CT Cervical Spine Wo Contrast Result Date: 05/04/2024 EXAM: CT HEAD AND CERVICAL SPINE 05/04/2024 02:22:26 AM TECHNIQUE: CT of the head and cervical  spine was performed without the administration of intravenous contrast. Multiplanar reformatted images are provided for review. Automated exposure control, iterative reconstruction, and/or weight based adjustment of the mA/kV was utilized to reduce the radiation dose to as low as reasonably achievable. COMPARISON: Cervical spine CT 11/06/2023, and head CT 01/01/2024. CLINICAL HISTORY: Head trauma, minor (Age >= 65y). Frequent falls. FINDINGS: CT HEAD BRAIN AND VENTRICLES: No acute intracranial hemorrhage. No mass effect or midline shift. No abnormal extra-axial fluid collection. No evidence of acute infarct. There is moderately advanced cerebral atrophy, with atrophic ventriculomegaly and mild to moderate cerebellar atrophy. Relatively mild small vessel disease of the cerebral white matter. ORBITS: There are old lens replacements, otherwise negative orbits. SINUSES AND MASTOIDS: No acute abnormality. SOFT TISSUES AND SKULL: No acute skull fracture. No acute soft tissue abnormality. No depressed skull fracture. There is no visible scalp hematoma. VASCULATURE: The carotid siphons are moderately calcified. No hyperdense vessel is seen. CT CERVICAL SPINE BONES AND ALIGNMENT: There is osteopenia without evidence of fractures or focal destructive bone lesion. Bone-on-bone anterior atlantodental joint space loss and reactive osteophytes are again noted and there is peripheral interbody ankylosis and facet ankylosis of C2-C3. From C3-C4 through C7-T1, there is chronic minimal stepwise grade 1 anterolisthesis probably  related to facet hypertrophy. No new or worsening alignment abnormality is seen. DEGENERATIVE CHANGES: There is mild disc space loss in the cervical spine, disc space calcifications. No disc herniation or cord compromise is seen. There is multilevel foraminal stenosis facet joint hypertrophy with minimal uncovertebral spurring. Tuv Acquired foraminal stenosis is most severe on the right at C3-C4 and on the right at C5-C6, left at C6-C7. SOFT TISSUES: No prevertebral soft tissue swelling. There are calcific plaques in the proximal cervical ICAs. There is a 2.2 cm hypodense solid nodule in the right lobe of the thyroid , which was previously biopsied in 2019. No follow-up imaging is recommended unless otherwise indicated. There is no laryngeal mass. No appreciable canal hematoma. LUNG APICES: Lung apices are clear. IMPRESSION: 1. No acute intracranial CT findings or depressed skull fractures. 2. No acute fracture or traumatic malalignment of the cervical spine. 3. Osteopenia and degenerative change of the cervical spine. Stable exam. 4. Moderately advanced cerebral atrophy with atrophic ventriculomegaly. Stable exam. Electronically signed by: Francis Quam MD 05/04/2024 02:52 AM EST RP Workstation: [id number]   DG Chest Portable 1 View Result Date: 05/04/2024 EXAM: 1 VIEW(S) XRAY OF THE CHEST 05/04/2024 01:17:00 AM COMPARISON: 11/29/2023 CLINICAL HISTORY: sob FINDINGS: LUNGS AND PLEURA: Moderate right basilar atelectasis is noted. No pleural effusion. No pneumothorax. HEART AND MEDIASTINUM: Atherosclerotic plaque. No acute abnormality of the cardiac and mediastinal silhouettes. BONES AND SOFT TISSUES: No acute osseous abnormality. IMPRESSION: 1. Moderate right basilar atelectasis, which may account for dyspnea. Electronically signed by: Oneil Devonshire MD 05/04/2024 01:27 AM EST RP Workstation: [id number]   DG Wrist Complete Left Result Date: 05/04/2024 EXAM: 3 or more VIEW(S) XRAY OF THE LEFT WRIST 05/04/2024  01:17:00 AM COMPARISON: None available. CLINICAL HISTORY: Weakness FINDINGS: BONES AND JOINTS: Acute nondisplaced extra-articular fracture at the base of the proximal phalanx of the thumb. SOFT TISSUES: The soft tissues are unremarkable. IMPRESSION: 1. Acute nondisplaced extra-articular fracture at the base of the proximal phalanx of the thumb. Electronically signed by: Oneil Devonshire MD 05/04/2024 01:23 AM EST RP Workstation: [id number]   DG Pelvis Portable Result Date: 05/04/2024 EXAM: 1 or 2 VIEW(S) XRAY OF THE PELVIS 05/04/2024 01:17:00 AM COMPARISON: 03/19/2014 CLINICAL HISTORY: SOB, weakness. FINDINGS: BONES AND JOINTS: No  acute fracture. No malalignment. Severe degenerative changes of the lower lumbar spine. SOFT TISSUES: Aortic atherosclerosis. IMPRESSION: 1. No acute findings. 2. Severe degenerative changes of the lower lumbar spine. Electronically signed by: Oneil Devonshire MD 05/04/2024 01:21 AM EST RP Workstation: [id number] Laneta Blunt, DO [facility] 05/05/2024, 5:18 PM    Dictation software may have been used to generate the above note. Typos may occur and escape review in typed/dictated notes. Please contact Dr Blunt directly for clarity if needed.  Staff may message me via secure chat in Epic  but this may not receive an immediate response,  please page me for urgent matters!  If 7PM-7AM, please contact night coverage [email or web address]

## 2024-05-05 NOTE — Progress Notes (Signed)
 Initial Nutrition Assessment  DOCUMENTATION CODES:   Severe malnutrition in context of chronic illness  INTERVENTION:   -MVI with minerals daily -Ensure Plus High Protein po TID, each supplement provides 350 kcal and 20 grams of protein  -Magic cup TID with meals, each supplement provides 290 kcal and 9 grams of protein  -Feeding assistance with meals -Continue regular diet   NUTRITION DIAGNOSIS:   Severe Malnutrition related to chronic illness (advanced dementia) as evidenced by moderate fat depletion, severe fat depletion, moderate muscle depletion, severe muscle depletion, percent weight loss.  GOAL:   Patient will meet greater than or equal to 90% of their needs  MONITOR:   PO intake, Supplement acceptance  REASON FOR ASSESSMENT:   Consult Assessment of nutrition requirement/status  ASSESSMENT:   83 y.o. male with medical history significant of advanced dementia, anxiety/depression, BPH, HTN, HLD, CKD stage IIIb, second-degree Mobitz type I heart block, recurrent falls and balance issue, IIDM, diabetic neuropathy, RLS, presented with frequent falls and increasing confusion at home.  Patient admitted with acute on chronic ambulatory impairment, recurrent falls, and probable secondary acute metabolic encephalopathy.   Reviewed I/O's: +360 ml x 24 hours   Patient sitting up in bed at time of visit. He had a flat affect, but smiled when RD greeted him. Patient shares that he has a great appetite, but has not eaten anything yet. Patient denies having a poor appetite at home and states I eat everything. He shares that his wife cooks for him at home, but is unable to provide a diet recall. Patient often replied I'm sorry I don't know or I don't remember to most RD questions. No family present to obtain further history.   Case discussed with SLP, who reports concern over poor oral intake. She is recommending RD evaluation for supplements and optimizing oral intake. Swallow  is functional, however, noted concern over 15-20# weight loss over the past few months. Reviewed weight history; patient has experienced a 7.9% weight loss over the past 3 months, which is significant for time frame.   Discussed importance of good meal and supplement intake to promote healing. Patient is amenable to supplements. RD would be hesitant to recommend artifical means of nutrition/ hydration due to advanced age and dementia, as it would not enhance his quality of life.   Per TOC notes, plan for SNF placement at discharge.   Medications reviewed and include neurontin  and protonix .   Lab Results  Component Value Date   HGBA1C 7.5 (H) 05/04/2024   PTA DM medications are 5 mg tradjenta  daily and 500 mg metformin  BID.   Labs reviewed: CBGS: 120-169 (inpatient orders for glycemic control are 0-9 units insulin  aspart TID with meals).    NUTRITION - FOCUSED PHYSICAL EXAM:  Flowsheet Row Most Recent Value  Orbital Region Moderate depletion  Upper Arm Region Severe depletion  Thoracic and Lumbar Region Severe depletion  Buccal Region Moderate depletion  Temple Region Moderate depletion  Clavicle Bone Region Severe depletion  Clavicle and Acromion Bone Region Severe depletion  Scapular Bone Region Severe depletion  Dorsal Hand Moderate depletion  Patellar Region Severe depletion  Anterior Thigh Region Severe depletion  Posterior Calf Region Severe depletion  Edema (RD Assessment) None  Hair Reviewed  Eyes Reviewed  Mouth Reviewed  Skin Reviewed  Nails Reviewed    Diet Order:   Diet Order             Diet regular Room service appropriate? Yes with Assist; Fluid consistency: Thin  Diet effective now                   EDUCATION NEEDS:   Education needs have been addressed  Skin:  Skin Assessment: Skin Integrity Issues: Skin Integrity Issues:: Other (Comment) Other: open wound to left pretibial and right anterior second toe  Last BM:  05/04/24  Height:   Ht  Readings from Last 1 Encounters:  05/04/24 5' 10 (1.778 m)    Weight:   Wt Readings from Last 1 Encounters:  05/04/24 73.1 kg    Ideal Body Weight:  75.5 kg  BMI:  Body mass index is 23.12 kg/m.  Estimated Nutritional Needs:   Kcal:  1900-2100  Protein:  105-120 grams  Fluid:  1.9-2.1 L    Margery ORN, RD, LDN, CDCES Registered Dietitian III Certified Diabetes Care and Education Specialist If unable to reach this RD, please use RD Inpatient group chat on secure chat between hours of 8am-4 pm daily

## 2024-05-05 NOTE — Hospital Course (Addendum)
 Hospital course / significant events:   HPI: James Hood is a 83 y.o. male with medical history significant of advanced dementia, anxiety/depression, BPH, HTN, HLD, CKD stage IIIb, second-degree Mobitz type I heart block, recurrent falls and balance issue, IIDM, diabetic neuropathy, RLS, presented with frequent falls and increasing confusion at home. Patient lives with his wife at home, has been using roller walker to ambulate since last year.  This time, patient reported that he has been feeling weak on both of his legs and both feet for several weeks and reportedly he sustained multiple falls in last 24 hours.  Patient complaining about weakness with up to the level below bilateral knees.  Patient also reported that he has been having a dry cough for  few months, and appears that he lost about 15 to 20 pounds in last 3 months.   12/15: to ED. Trauma scan showed left nondisplaced extra-articular fracture at the base of proximal phalanx of the thumb, CT head showed brain atrophy.  12/16: Gabapentin  decreased from 600 303 times daily Cymbalta  decreased to 30 mg daily and Seroquel  down to 50 mg daily, trazodone  discontinued Flexeril  discontinued Aricept  and Namenda  held. 12/17. Patient has some sundowning in afternoon 12/18.  Will discharge to rehab today

## 2024-05-06 DIAGNOSIS — R634 Abnormal weight loss: Secondary | ICD-10-CM

## 2024-05-06 DIAGNOSIS — G9341 Metabolic encephalopathy: Secondary | ICD-10-CM | POA: Diagnosis not present

## 2024-05-06 DIAGNOSIS — E119 Type 2 diabetes mellitus without complications: Secondary | ICD-10-CM | POA: Diagnosis not present

## 2024-05-06 DIAGNOSIS — E43 Unspecified severe protein-calorie malnutrition: Secondary | ICD-10-CM

## 2024-05-06 DIAGNOSIS — E785 Hyperlipidemia, unspecified: Secondary | ICD-10-CM | POA: Diagnosis not present

## 2024-05-06 DIAGNOSIS — S62501K Fracture of unspecified phalanx of right thumb, subsequent encounter for fracture with nonunion: Secondary | ICD-10-CM

## 2024-05-06 DIAGNOSIS — S62501S Fracture of unspecified phalanx of right thumb, sequela: Secondary | ICD-10-CM | POA: Diagnosis not present

## 2024-05-06 DIAGNOSIS — N179 Acute kidney failure, unspecified: Secondary | ICD-10-CM | POA: Diagnosis not present

## 2024-05-06 DIAGNOSIS — R296 Repeated falls: Secondary | ICD-10-CM | POA: Diagnosis not present

## 2024-05-06 DIAGNOSIS — S62509A Fracture of unspecified phalanx of unspecified thumb, initial encounter for closed fracture: Secondary | ICD-10-CM | POA: Insufficient documentation

## 2024-05-06 LAB — BLOOD GAS, VENOUS
Acid-Base Excess: 7.9 mmol/L — ABNORMAL HIGH (ref 0.0–2.0)
Bicarbonate: 36.4 mmol/L — ABNORMAL HIGH (ref 20.0–28.0)
Patient temperature: 37
pCO2, Ven: 69 mmHg — ABNORMAL HIGH (ref 44–60)
pH, Ven: 7.33 (ref 7.25–7.43)

## 2024-05-06 LAB — COMPREHENSIVE METABOLIC PANEL WITH GFR
ALT: 21 U/L (ref 0–44)
AST: 38 U/L (ref 15–41)
Albumin: 3.4 g/dL — ABNORMAL LOW (ref 3.5–5.0)
Alkaline Phosphatase: 90 U/L (ref 38–126)
Anion gap: 12 (ref 5–15)
BUN: 26 mg/dL — ABNORMAL HIGH (ref 8–23)
CO2: 24 mmol/L (ref 22–32)
Calcium: 9.4 mg/dL (ref 8.9–10.3)
Chloride: 96 mmol/L — ABNORMAL LOW (ref 98–111)
Creatinine, Ser: 1.29 mg/dL — ABNORMAL HIGH (ref 0.61–1.24)
GFR, Estimated: 55 mL/min — ABNORMAL LOW (ref >60)
Glucose, Bld: 135 mg/dL — ABNORMAL HIGH (ref 70–99)
Potassium: 4.3 mmol/L (ref 3.5–5.1)
Sodium: 133 mmol/L — ABNORMAL LOW (ref 135–145)
Total Bilirubin: 0.5 mg/dL (ref 0.0–1.2)
Total Protein: 6.3 g/dL — ABNORMAL LOW (ref 6.5–8.1)

## 2024-05-06 LAB — GLUCOSE, CAPILLARY
Glucose-Capillary: 100 mg/dL — ABNORMAL HIGH (ref 70–99)
Glucose-Capillary: 124 mg/dL — ABNORMAL HIGH (ref 70–99)
Glucose-Capillary: 153 mg/dL — ABNORMAL HIGH (ref 70–99)
Glucose-Capillary: 155 mg/dL — ABNORMAL HIGH (ref 70–99)
Glucose-Capillary: 184 mg/dL — ABNORMAL HIGH (ref 70–99)

## 2024-05-06 LAB — AMMONIA: Ammonia: 26 umol/L (ref 9–35)

## 2024-05-06 LAB — CBC
HCT: 34.8 % — ABNORMAL LOW (ref 39.0–52.0)
Hemoglobin: 11.7 g/dL — ABNORMAL LOW (ref 13.0–17.0)
MCH: 31.7 pg (ref 26.0–34.0)
MCHC: 33.6 g/dL (ref 30.0–36.0)
MCV: 94.3 fL (ref 80.0–100.0)
Platelets: 230 K/uL (ref 150–400)
RBC: 3.69 MIL/uL — ABNORMAL LOW (ref 4.22–5.81)
RDW: 12.6 % (ref 11.5–15.5)
WBC: 7 K/uL (ref 4.0–10.5)
nRBC: 0 % (ref 0.0–0.2)

## 2024-05-06 MED ORDER — RISPERIDONE 1 MG PO TABS
1.0000 mg | ORAL_TABLET | Freq: Two times a day (BID) | ORAL | Status: DC | PRN
  Filled 2024-05-06: qty 1

## 2024-05-06 MED ORDER — SODIUM CHLORIDE 0.9 % IV SOLN: INTRAVENOUS | Status: DC

## 2024-05-06 NOTE — Assessment & Plan Note (Signed)
 Continue supplements

## 2024-05-06 NOTE — Assessment & Plan Note (Signed)
 Follow up as outpatient

## 2024-05-06 NOTE — TOC Progression Note (Signed)
 Transition of Care [facility]) - Progression Note    Patient Details  Name: James Hood MRN: [id number] Date of Birth: 1941/05/03  [facility]) CM/SW Contact  Alvaro Louder, [place] Phone Number: 05/06/2024, 3:23 PM  Clinical Narrative:   Shara back for SNF [facility].  SNF approved plan auth id # [id number] 05/06/24 - 05/08/2024 next review date 05/08/24   Patient to be reevaluated tomorrow by MD to determine medical readiness. If medically ready patient will be Dc'd to [facility].   TOC to follow for discharge     Expected Discharge Plan: Skilled Nursing Facility Barriers to Discharge: Continued Medical Work up               Expected Discharge Plan and Services   Discharge Planning Services: CM Consult   Living arrangements for the past 2 months: Mobile Home (Travel trailer)                                       Social Drivers of Health (SDOH) Interventions SDOH Screenings   Food Insecurity: No Food Insecurity (05/04/2024)  Housing: Low Risk (05/04/2024)  Transportation Needs: No Transportation Needs (05/05/2024)  Utilities: Not At Risk (05/05/2024)  Alcohol  Screen: Low Risk (12/31/2023)  Depression (PHQ2-9): Low Risk (01/01/2024)  Financial Resource Strain: Medium Risk (12/31/2023)  Physical Activity: Insufficiently Active (12/31/2023)  Social Connections: Socially Integrated (05/05/2024)  Stress: No Stress Concern Present (12/31/2023)  Tobacco Use: High Risk (05/04/2024)  Health Literacy: Inadequate Health Literacy (01/01/2024)    Readmission Risk Interventions    05/04/2024   12:18 PM  Readmission Risk Prevention Plan  Transportation Screening Complete  Medication Review (RN Care Manager) Complete  PCP or Specialist appointment within 3-5 days of discharge Complete  HRI or Home Care Consult Complete  SW Recovery Care/Counseling Consult Not Complete  SW Consult Not Complete Comments NA  Palliative Care  Screening Not Applicable  Skilled Nursing Facility Complete

## 2024-05-06 NOTE — Progress Notes (Signed)
 Around 1720 nurse was called to the room. Patient noted to have pulled out his IV and purewick. Patient also removed wrist splint.   Provider notified of the increase agitation. Per provider no need to replace IV if unable.   Nurse attempted to replace to IV and wrist splint and patient was punching and kicking the nurses (myself and Bora, CHARITY FUNDRAISER) that were in the room.   Charge nurse aware of the situation.  Nurse will attempted to get the wrist splint and if not able will pass along to oncoming shift.

## 2024-05-06 NOTE — Plan of Care (Signed)

## 2024-05-06 NOTE — Assessment & Plan Note (Addendum)
 Could be secondary to polypharmacy and/or dementia.  pCO2 was slightly elevated on previous blood gas.  Will check VBG tomorrow.  Gentle fluids overnight.

## 2024-05-06 NOTE — Assessment & Plan Note (Addendum)
 On Crestor .  CK normal range.

## 2024-05-06 NOTE — Assessment & Plan Note (Signed)
 Hemoglobin A1c 7.5.  Restart Farxiga . Check fingerstaick daily and anytime with altered mental status

## 2024-05-06 NOTE — Assessment & Plan Note (Addendum)
 Physical therapy recommending rehab. Polypharmacy could be playing a role.  Gabapentin  dose decreased, Seroquel  dose decreased, Cymbalta  dose decreased.  Aricept  held.  Discontinued Flexeril .  MRI of the brain showed no acute intracranial abnormality.  Pending vitamin D . Can go out to rehab today

## 2024-05-06 NOTE — Care Management Important Message (Signed)
 Important Message  Patient Details  Name: James Hood MRN: [id number] Date of Birth: 01-Jul-1940   Important Message Given:  Yes - Medicare IM     Aurorah Schlachter W, CMA 05/06/2024, 3:53 PM

## 2024-05-06 NOTE — Progress Notes (Addendum)
 Physical Therapy Treatment Patient Details Name: James Hood MRN: [id number] DOB: 02/16/1941 Today's Date: 05/06/2024   History of Present Illness Pt is an 83 y/o M admitted on 05/04/24 after presenting with c/o generalized weakness & falls. Brain MRI negative. X-ray showed B basilar atelectasis. PMH: dementia, DM2, neuropathy, RLS, anxiety, depression, hx of alcoholism, HTN, hyperhtyroidism, essential tremor    PT Comments  Pt was lying in bed on arrival, was sleeping but awakened to voice. He was confused, but participated during the session and put forth good effort throughout. Pt was did a great mobilizing to EOB with supervision most of the way, only needing assistance with scooting close to the EOB. He did have a posterior lean but was able to balance in sitting without needing external support. Pt transferred to standing with HHA +2 physical assist, and knee blocking to promote extension through bilat LE. Once in standing pt transitioned to using RW and was able to take lateral shuffles (a couple of inches) without toe clearance, despite multiple cues, towards HOB with mod A +2. Pt needed max cueing throughout, simple commands and increased time for processing. Getting back to supine required min A for LE navigation. Pt was left as found with call bell, phone and nursing present. Pt will benefit from continued PT services upon discharge to safely address deficits listed in patient problem list for decreased caregiver assistance and eventual return to PLOF.    If plan is discharge home, recommend the following: A lot of help with walking and/or transfers;A lot of help with bathing/dressing/bathroom;Assistance with feeding;Assist for transportation;Assistance with cooking/housework;Direct supervision/assist for financial management;Supervision due to cognitive status;Help with stairs or ramp for entrance;Direct supervision/assist for medications management   Can travel by private vehicle      No  Equipment Recommendations  Other (comment)    Recommendations for Other Services       Precautions / Restrictions Precautions Precautions: Fall Recall of Precautions/Restrictions: Impaired Restrictions Weight Bearing Restrictions Per Provider Order: No     Mobility  Bed Mobility Overal bed mobility: Needs Assistance Bed Mobility: Supine to Sit, Sit to Supine     Supine to sit: Used rails, HOB elevated, Supervision, Min assist Sit to supine: Min assist   General bed mobility comments: increased time to mobilize to EOB with supervision. Needed min A for scooting forward. posterior lean. HOB elevated used bed rails, with max cueing    Transfers Overall transfer level: Needs assistance Equipment used: Rolling walker (2 wheels), 2 person hand held assist Transfers: Sit to/from Stand Sit to Stand: Mod assist, Max assist, +2 physical assistance, From elevated surface           General transfer comment: stood bedside with HHA physical assist and was able to very minimally shift with max cues and multiple attempts to the [facility] with RW and mod A +2    Ambulation/Gait General Gait Details: no true ambulation during this session, was able very minimally shift towards [facility] with RW    Stairs             Wheelchair Mobility     Tilt Bed    Modified Rankin (Stroke Patients Only)       Balance Overall balance assessment: Needs assistance Sitting-balance support: Feet supported, No upper extremity supported Sitting balance-Leahy Scale: Fair Sitting balance - Comments: posterior lean, but able to maintain upright sitting balance with cueing Postural control: Posterior lean Standing balance support: Reliant on assistive device for balance, Bilateral upper extremity supported  Standing balance-Leahy Scale: Poor Standing balance comment: required HHA to stand due to splint on L forearm/hand, was unable to fully extend legs, significant posterior lean                             Communication Communication Communication: Impaired Factors Affecting Communication: Difficulty expressing self  Cognition Arousal: Alert Behavior During Therapy: Impulsive   PT - Cognitive impairments: Difficult to assess, Awareness, Safety/Judgement, Sequencing, Problem solving, Initiation, Memory, Attention                         Following commands: Impaired Following commands impaired: Follows one step commands with increased time    Cueing Cueing Techniques: Verbal cues, Gestural cues, Tactile cues, Visual cues  Exercises      General Comments General comments (skin integrity, edema, etc.): L forearm/hand splinted and wrapped, intact pre and post session      Pertinent Vitals/Pain Pain Assessment Pain Assessment: Faces Faces Pain Scale: Hurts a little bit Pain Location: L thumb Pain Descriptors / Indicators: Moaning, Grimacing, Guarding Pain Intervention(s): Monitored during session, Repositioned    Home Living                          Prior Function            PT Goals (current goals can now be found in the care plan section) Acute Rehab PT Goals Patient Stated Goal: get better, go to rehab PT Goal Formulation: With patient/family Time For Goal Achievement: 05/18/24 Potential to Achieve Goals: Good Progress towards PT goals: Progressing toward goals    Frequency    Min 2X/week      PT Plan      Co-evaluation              AM-PAC PT 6 Clicks Mobility   Outcome Measure  Help needed turning from your back to your side while in a flat bed without using bedrails?: A Little Help needed moving from lying on your back to sitting on the side of a flat bed without using bedrails?: A Little Help needed moving to and from a bed to a chair (including a wheelchair)?: A Lot Help needed standing up from a chair using your arms (e.g., wheelchair or bedside chair)?: A Lot Help needed to walk in hospital room?: A  Lot Help needed climbing 3-5 steps with a railing? : Total 6 Click Score: 13    End of Session Equipment Utilized During Treatment: Gait belt Activity Tolerance: Patient limited by lethargy Patient left: in bed;with call bell/phone within reach;with bed alarm set;with nursing/sitter in room (nursing present in room) Nurse Communication: Mobility status PT Visit Diagnosis: Muscle weakness (generalized) (M62.81);Other abnormalities of gait and mobility (R26.89);Unsteadiness on feet (R26.81);History of falling (Z91.81);Difficulty in walking, not elsewhere classified (R26.2)     Time: 8854-8798 PT Time Calculation (min) (ACUTE ONLY): 16 min  Charges:                            Corean Newport, SPT 05/06/2024, 12:54 PM   This entire session was performed under direct supervision and direction of a licensed therapist/therapist assistant. I have personally read, edited and approve of the note as written.  Carmin Deed, DPT

## 2024-05-06 NOTE — Progress Notes (Signed)
 Progress Note   Patient: James Hood FMW:[id number] DOB: 04-09-1941 DOA: 05/04/2024     2 DOS: the patient was seen and examined on 05/06/2024   Brief hospital course: Hospital course / significant events:   HPI: James Hood is a 83 y.o. male with medical history significant of advanced dementia, anxiety/depression, BPH, HTN, HLD, CKD stage IIIb, second-degree Mobitz type I heart block, recurrent falls and balance issue, IIDM, diabetic neuropathy, RLS, presented with frequent falls and increasing confusion at home. Patient lives with his wife at home, has been using roller walker to ambulate since last year.  This time, patient reported that he has been feeling weak on both of his legs and both feet for several weeks and reportedly he sustained multiple falls in last 24 hours.  Patient complaining about weakness with up to the level below bilateral knees.  Patient also reported that he has been having a dry cough for  few months, and appears that he lost about 15 to 20 pounds in last 3 months.   12/15: to ED. Trauma scan showed left nondisplaced extra-articular fracture at the base of proximal phalanx of the thumb, CT head showed brain atrophy.  12/16: Gabapentin  decreased from 600 303 times daily Cymbalta  decreased to 30 mg daily and Seroquel  down to 50 mg daily, trazodone  discontinued Flexeril  discontinued Aricept  and Namenda  held.             Assessment and Plan: * Recurrent falls Physical therapy recommending rehab. Polypharmacy could be playing a role.  Gabapentin  dose decreased, Seroquel  dose decreased, Cymbalta  dose decreased.  Aricept  and Namenda  held.  Discontinued Flexeril .  MRI of the brain showed no acute intracranial abnormality.  Check vitamin D .  Acute metabolic encephalopathy Could be secondary to polypharmacy and/or dementia.  pCO2 was slightly elevated on previous blood gas.  Will check VBG tomorrow.  Gentle fluids overnight.  Thumb fracture Refer to  orthopedics as outpatient.  Placed in splint by ER physician.  Protein-calorie malnutrition, severe Continue supplements  Weight loss Follow-up as outpatient  Hyperlipidemia, unspecified On Crestor .  CK normal range.  Controlled type 2 diabetes mellitus without complication, without long-term current use of insulin  (HCC) As needed globin A1c 7.5.        Subjective: Patient answers a few questions.  Admitted with leg weakness and chronic falls.  Physical Exam: Vitals:   05/05/24 2355 05/06/24 0428 05/06/24 0755 05/06/24 1328  BP: (!) 112/91 116/67 120/66 135/84  Pulse: (!) 50 78 74 75  Resp: 17 17 16 17   Temp: 98.3 F (36.8 C) 98 F (36.7 C) 97.6 F (36.4 C) 98 F (36.7 C)  TempSrc:      SpO2: 90% 97% 99% 97%  Weight:      Height:       Physical Exam HENT:     Head: Normocephalic.  Eyes:     General: Lids are normal.     Conjunctiva/sclera: Conjunctivae normal.  Cardiovascular:     Rate and Rhythm: Normal rate and regular rhythm.     Heart sounds: Normal heart sounds, S1 normal and S2 normal.  Pulmonary:     Breath sounds: No decreased breath sounds, wheezing, rhonchi or rales.  Abdominal:     Palpations: Abdomen is soft.     Tenderness: There is no abdominal tenderness.  Musculoskeletal:     Right lower leg: No swelling.     Left lower leg: No swelling.  Skin:    General: Skin is warm.  Findings: No rash.  Neurological:     Mental Status: He is alert.     Data Reviewed: Sodium 133, creatinine 1.29, white blood cell count 7.0, hemoglobin 11.7, platelet count 230  Family Communication: Spoke with wife on the phone  Disposition: Status is: Inpatient Remains inpatient appropriate because: Physical therapy recommending rehab.  TOC starting insurance authorization  Planned Discharge Destination: Rehab    Time spent: 28 minutes  Author: Charlie Patterson, MD 05/06/2024 2:42 PM  For on call review [email or web address].

## 2024-05-06 NOTE — TOC CM/SW Note (Signed)
 Transition of Care (TOC) CM/SW Note    Pt spouse called LCSW A Clement to report she consulted with daughter and the choose  [employer].

## 2024-05-06 NOTE — Assessment & Plan Note (Addendum)
 Refer to orthopedics as outpatient.  Placed in splint by ER physician.

## 2024-05-07 DIAGNOSIS — N179 Acute kidney failure, unspecified: Secondary | ICD-10-CM | POA: Diagnosis not present

## 2024-05-07 DIAGNOSIS — R296 Repeated falls: Secondary | ICD-10-CM | POA: Diagnosis not present

## 2024-05-07 DIAGNOSIS — S62501S Fracture of unspecified phalanx of right thumb, sequela: Secondary | ICD-10-CM | POA: Diagnosis not present

## 2024-05-07 DIAGNOSIS — G9341 Metabolic encephalopathy: Secondary | ICD-10-CM | POA: Diagnosis not present

## 2024-05-07 LAB — CBC
HCT: 33 % — ABNORMAL LOW (ref 39.0–52.0)
Hemoglobin: 11.6 g/dL — ABNORMAL LOW (ref 13.0–17.0)
MCH: 31.7 pg (ref 26.0–34.0)
MCHC: 35.2 g/dL (ref 30.0–36.0)
MCV: 90.2 fL (ref 80.0–100.0)
Platelets: 256 K/uL (ref 150–400)
RBC: 3.66 MIL/uL — ABNORMAL LOW (ref 4.22–5.81)
RDW: 12.5 % (ref 11.5–15.5)
WBC: 6.4 K/uL (ref 4.0–10.5)
nRBC: 0 % (ref 0.0–0.2)

## 2024-05-07 LAB — BASIC METABOLIC PANEL WITH GFR
Anion gap: 12 (ref 5–15)
BUN: 21 mg/dL (ref 8–23)
CO2: 24 mmol/L (ref 22–32)
Calcium: 9.9 mg/dL (ref 8.9–10.3)
Chloride: 96 mmol/L — ABNORMAL LOW (ref 98–111)
Creatinine, Ser: 1.02 mg/dL (ref 0.61–1.24)
GFR, Estimated: >60 mL/min (ref >60)
Glucose, Bld: 120 mg/dL — ABNORMAL HIGH (ref 70–99)
Potassium: 4 mmol/L (ref 3.5–5.1)
Sodium: 132 mmol/L — ABNORMAL LOW (ref 135–145)

## 2024-05-07 LAB — GLUCOSE, CAPILLARY
Glucose-Capillary: 123 mg/dL — ABNORMAL HIGH (ref 70–99)
Glucose-Capillary: 196 mg/dL — ABNORMAL HIGH (ref 70–99)

## 2024-05-07 LAB — BLOOD GAS, VENOUS
Acid-Base Excess: 3.7 mmol/L — ABNORMAL HIGH (ref 0.0–2.0)
Bicarbonate: 28.5 mmol/L — ABNORMAL HIGH (ref 20.0–28.0)
O2 Saturation: 85.2 %
Patient temperature: 37
pCO2, Ven: 43 mmHg — ABNORMAL LOW (ref 44–60)
pH, Ven: 7.43 (ref 7.25–7.43)
pO2, Ven: 54 mmHg — ABNORMAL HIGH (ref 32–45)

## 2024-05-07 LAB — VITAMIN D 25 HYDROXY (VIT D DEFICIENCY, FRACTURES): Vit D, 25-Hydroxy: 37.6 ng/mL (ref 30–100)

## 2024-05-07 MED ORDER — DULOXETINE HCL 30 MG PO CPEP: 30.0000 mg | ORAL_CAPSULE | Freq: Every day | ORAL | 0 refills | Status: AC

## 2024-05-07 MED ORDER — QUETIAPINE FUMARATE 50 MG PO TABS: 50.0000 mg | ORAL_TABLET | Freq: Every day | ORAL | 0 refills | Status: AC

## 2024-05-07 MED ORDER — GABAPENTIN 300 MG PO CAPS: 300.0000 mg | ORAL_CAPSULE | Freq: Three times a day (TID) | ORAL | 0 refills | Status: AC

## 2024-05-07 MED ORDER — RISPERIDONE 1 MG PO TABS: 1.0000 mg | ORAL_TABLET | Freq: Two times a day (BID) | ORAL | 0 refills | Status: DC | PRN

## 2024-05-07 MED ORDER — PRIMIDONE 50 MG PO TABS: ORAL_TABLET | ORAL | 0 refills | Status: AC

## 2024-05-07 MED ORDER — LISINOPRIL 20 MG PO TABS: 20.0000 mg | ORAL_TABLET | Freq: Every day | ORAL | 0 refills | Status: DC

## 2024-05-07 MED ORDER — ACETAMINOPHEN 325 MG PO TABS: 650.0000 mg | ORAL_TABLET | Freq: Four times a day (QID) | ORAL | Status: AC | PRN

## 2024-05-07 MED ORDER — ENSURE PLUS HIGH PROTEIN PO LIQD: 237.0000 mL | Freq: Three times a day (TID) | ORAL | 0 refills | Status: DC

## 2024-05-07 NOTE — Plan of Care (Signed)
°  Problem: Coping: Goal: Ability to adjust to condition or change in health will improve Outcome: Progressing   Problem: Metabolic: Goal: Ability to maintain appropriate glucose levels will improve Outcome: Progressing   Problem: Skin Integrity: Goal: Risk for impaired skin integrity will decrease Outcome: Progressing   Problem: Clinical Measurements: Goal: Diagnostic test results will improve Outcome: Progressing   Problem: Activity: Goal: Risk for activity intolerance will decrease Outcome: Progressing

## 2024-05-07 NOTE — Progress Notes (Signed)
 Report given to Blair, RN at [facility]  via phone [phone]). Belongings handed over.Transport booked

## 2024-05-07 NOTE — Discharge Summary (Signed)
 Physician Discharge Summary   Patient: James Hood MRN: [id number] DOB: 03/05/1941  Admit date:     05/04/2024  Discharge date: 05/07/2024  Discharge Physician: Charlie Patterson   PCP: Gretel App, NP   Recommendations at discharge:    Follow up team at rehab one day Follow up orthopedics one week  Discharge Diagnoses: Principal Problem:   Recurrent falls Active Problems:   Acute metabolic encephalopathy   Controlled type 2 diabetes mellitus without complication, without long-term current use of insulin  (HCC)   Hyperlipidemia, unspecified   Weight loss   AKI (acute kidney injury)   Protein-calorie malnutrition, severe   Thumb fracture  Resolved Problems:   * No resolved hospital problems. [facility] Course: Hospital course / significant events:   HPI: James Hood is a 83 y.o. male with medical history significant of advanced dementia, anxiety/depression, BPH, HTN, HLD, CKD stage IIIb, second-degree Mobitz type I heart block, recurrent falls and balance issue, IIDM, diabetic neuropathy, RLS, presented with frequent falls and increasing confusion at home. Patient lives with his wife at home, has been using roller walker to ambulate since last year.  This time, patient reported that he has been feeling weak on both of his legs and both feet for several weeks and reportedly he sustained multiple falls in last 24 hours.  Patient complaining about weakness with up to the level below bilateral knees.  Patient also reported that he has been having a dry cough for  few months, and appears that he lost about 15 to 20 pounds in last 3 months.   12/15: to ED. Trauma scan showed left nondisplaced extra-articular fracture at the base of proximal phalanx of the thumb, CT head showed brain atrophy.  12/16: Gabapentin  decreased from 600 303 times daily Cymbalta  decreased to 30 mg daily and Seroquel  down to 50 mg daily, trazodone  discontinued Flexeril  discontinued Aricept  and Namenda   held. 12/17. Patient has some sundowning in afternoon 12/18.  Will discharge to rehab today             Assessment and Plan: * Recurrent falls Physical therapy recommending rehab. Polypharmacy could be playing a role.  Gabapentin  dose decreased, Seroquel  dose decreased, Cymbalta  dose decreased.  Aricept  held.  Discontinued Flexeril .  MRI of the brain showed no acute intracranial abnormality.  Pending vitamin D . Can go out to rehab today  Acute metabolic encephalopathy Could be secondary to polypharmacy and/or dementia.  pCO2 was slightly elevated on previous blood gas.  PCO2 on repeat vbg 43. Patient has underlying dementia and had some sundowning.  Continue seroquel  at night and as needed risperidol for any agitation.  Thumb fracture Refer to orthopedics as outpatient.  Placed in splint by ER physician.  Protein-calorie malnutrition, severe Continue supplements  AKI (acute kidney injury) Creatinine 1.54 and down to 1.02 upon discharge.  Likely has element on ckd stage 2 underlying  Weight loss Follow-up as outpatient  Hyperlipidemia, unspecified On Crestor .  CK normal range.  Controlled type 2 diabetes mellitus without complication, without long-term current use of insulin  (HCC) Hemoglobin A1c 7.5.  Restart Farxiga . Check fingerstaick daily and anytime with altered mental status         Consultants: none Procedures performed: none Disposition: Rehabilitation facility Diet recommendation:  Regular DISCHARGE MEDICATION: Allergies as of 05/07/2024   No Known Allergies      Medication List     STOP taking these medications    ascorbic acid  500 MG tablet Commonly known as: VITAMIN C   cyclobenzaprine  10 MG tablet Commonly known as: FLEXERIL    diphenoxylate -atropine  2.5-0.025 MG tablet Commonly known as: LOMOTIL    donepezil  10 MG tablet Commonly known as: ARICEPT    gabapentin  600 MG tablet Commonly known as: NEURONTIN  Replaced by: gabapentin  300  MG capsule   linagliptin  5 MG Tabs tablet Commonly known as: Tradjenta    lisinopril -hydrochlorothiazide  20-25 MG tablet Commonly known as: ZESTORETIC    metFORMIN  500 MG 24 hr tablet Commonly known as: GLUCOPHAGE -XR   nystatin  100000 UNIT/ML suspension Commonly known as: MYCOSTATIN    traZODone  100 MG tablet Commonly known as: DESYREL    Trospium  Chloride 60 MG Cp24       TAKE these medications    pramipexole  0.125 MG tablet Commonly known as: MIRAPEX  TAKE 1 TABLET BY MOUTH EVERY NIGHT 2-3 HOURS BEFORE BEDTIME The timing of this medication is very important.   acetaminophen  325 MG tablet Commonly known as: TYLENOL  Take 2 tablets (650 mg total) by mouth every 6 (six) hours as needed for mild pain (pain score 1-3) or fever (or Fever >/= 101).   aspirin  EC 81 MG tablet Take 81 mg by mouth daily.   buPROPion  150 MG 24 hr tablet Commonly known as: WELLBUTRIN  XL TAKE 1 TABLET BY MOUTH DAILY   CENTRUM SILVER PO Take 1 tablet by mouth daily.   divalproex  500 MG 24 hr tablet Commonly known as: DEPAKOTE  ER Take 1 tablet by mouth 2 (two) times daily.   DULoxetine  30 MG capsule Commonly known as: CYMBALTA  Take 1 capsule (30 mg total) by mouth daily. What changed: how much to take   Farxiga  10 MG Tabs tablet Generic drug: dapagliflozin  propanediol Take 10 mg by mouth daily.   feeding supplement Liqd Take 237 mLs by mouth 3 (three) times daily between meals.   fenofibrate  145 MG tablet Commonly known as: TRICOR  Take 1 tablet (145 mg total) by mouth daily.   FreeStyle Libre 2 Reader [place] Use to check glucose at least every 8 hours   FreeStyle Libre 2 Sensor Misc Apply every 14 days to check glucose   gabapentin  300 MG capsule Commonly known as: NEURONTIN  Take 1 capsule (300 mg total) by mouth 3 (three) times daily. Replaces: gabapentin  600 MG tablet   lisinopril  20 MG tablet Commonly known as: ZESTRIL  Take 1 tablet (20 mg total) by mouth daily.   memantine   10 MG tablet Commonly known as: NAMENDA  Take 1 tablet by mouth daily.   metoprolol  succinate 25 MG 24 hr tablet Commonly known as: TOPROL -XL Take 1 tablet (25 mg total) by mouth daily.   mupirocin ointment 2 % Commonly known as: BACTROBAN Apply 1 Application topically 2 (two) times daily.   omega-3 acid ethyl esters 1 g capsule Commonly known as: LOVAZA  Take 2 capsules (2 g total) by mouth 2 (two) times daily.   pantoprazole  40 MG tablet Commonly known as: PROTONIX  Take 40 mg by mouth daily.   primidone  50 MG tablet Commonly known as: MYSOLINE  Take 5 tablets (250 mg total) by mouth daily AND 3 tablets (150 mg total) at bedtime. What changed: See the new instructions.   QUEtiapine  50 MG tablet Commonly known as: SEROQUEL  Take 1 tablet (50 mg total) by mouth at bedtime. What changed:  medication strength how much to take   risperiDONE  1 MG tablet Commonly known as: RISPERDAL  Take 1 tablet (1 mg total) by mouth 2 (two) times daily as needed (agititation).   rosuvastatin  40 MG tablet Commonly known as: CRESTOR  Take 1 tablet (40 mg total) by  mouth daily.   tamsulosin  0.4 MG Caps capsule Commonly known as: FLOMAX  TAKE 2 CAPSULES BY MOUTH DAILY AFTER SUPPER        Contact information for follow-up providers     Poggi, Norleen PARAS, MD Follow up in 1 week(s).   Specialty: Orthopedic Surgery Why: hospital follow up for thumb fracture Contact information: [address] [facility] [address] [facility] [place] [facility] [place] [address] [phone]              Contact information for after-discharge care     Destination     [facility] .   Service: Skilled Nursing Contact information: [address] [place]  [phone]                    Discharge Exam: Fredricka Weights   05/04/24 0026 05/04/24 2039  Weight: 72.8 kg 73.1 kg   Physical Exam HENT:     Head: Normocephalic.  Eyes:     General: Lids are normal.      Conjunctiva/sclera: Conjunctivae normal.  Cardiovascular:     Rate and Rhythm: Normal rate and regular rhythm.     Heart sounds: Normal heart sounds, S1 normal and S2 normal.  Pulmonary:     Breath sounds: No decreased breath sounds, wheezing, rhonchi or rales.  Abdominal:     Palpations: Abdomen is soft.     Tenderness: There is no abdominal tenderness.  Musculoskeletal:     Right lower leg: No swelling.     Left lower leg: No swelling.  Skin:    General: Skin is warm.     Findings: No rash.  Neurological:     Mental Status: He is alert.     Comments: Able to straight leg raise bilaterally today      Condition at discharge: stable  The results of significant diagnostics from this hospitalization (including imaging, microbiology, ancillary and laboratory) are listed below for reference.   Imaging Studies: MR BRAIN WO CONTRAST Result Date: 05/04/2024 EXAM: MRI BRAIN WITHOUT CONTRAST 05/04/2024 07:55:54 AM TECHNIQUE: Multiplanar multisequence MRI of the head/brain was performed without the administration of intravenous contrast. COMPARISON: Brain MRI 11/30/2023 and CT head 05/04/2024. CLINICAL HISTORY: 83 year old male with dementia and increasing weakness . FINDINGS: BRAIN AND VENTRICLES: Generalized cerebral volume loss with ex vacuo appearing ventricular enlargement has not significantly changed since July (series 17 image 19). Normal flow voids. No acute infarct. No intracranial hemorrhage. No mass. No midline shift. Mesial temporal lobe atrophy (image 17) but not clearly disproportionate. Tiny chronic right cerebellar infarct on series 8 image 6 is stable. No chronic cerebral blood products on SWI. Patchy chronic periventricular white matter T2 and FLAIR hyperintensity is stable. No convincing transependymal edema. No cortical encephalomalacia. Maintained deep gray matter nuclei and brainstem signal. The sella is unremarkable. ORBITS: No acute abnormality. SINUSES AND MASTOIDS: No  acute abnormality. BONES AND SOFT TISSUES: Normal marrow signal. No acute soft tissue abnormality. Negative visible cervical spine. IMPRESSION: 1. No acute intracranial abnormality. 2. Stable chronic generalized cerebral volume loss with ex vacuo ventricular enlargement and minimal to mild changes of chronic small vessel disease. Electronically signed by: Helayne Hurst MD 05/04/2024 08:03 AM EST RP Workstation: [id number]   CT HEAD WO CONTRAST ( ) Result Date: 05/04/2024 EXAM: CT HEAD AND CERVICAL SPINE 05/04/2024 02:22:26 AM TECHNIQUE: CT of the head and cervical spine was performed without the administration of intravenous contrast. Multiplanar reformatted images are provided for review. Automated exposure control, iterative reconstruction, and/or weight  based adjustment of the mA/kV was utilized to reduce the radiation dose to as low as reasonably achievable. COMPARISON: Cervical spine CT 11/06/2023, and head CT 01/01/2024. CLINICAL HISTORY: Head trauma, minor (Age >= 65y). Frequent falls. FINDINGS: CT HEAD BRAIN AND VENTRICLES: No acute intracranial hemorrhage. No mass effect or midline shift. No abnormal extra-axial fluid collection. No evidence of acute infarct. There is moderately advanced cerebral atrophy, with atrophic ventriculomegaly and mild to moderate cerebellar atrophy. Relatively mild small vessel disease of the cerebral white matter. ORBITS: There are old lens replacements, otherwise negative orbits. SINUSES AND MASTOIDS: No acute abnormality. SOFT TISSUES AND SKULL: No acute skull fracture. No acute soft tissue abnormality. No depressed skull fracture. There is no visible scalp hematoma. VASCULATURE: The carotid siphons are moderately calcified. No hyperdense vessel is seen. CT CERVICAL SPINE BONES AND ALIGNMENT: There is osteopenia without evidence of fractures or focal destructive bone lesion. Bone-on-bone anterior atlantodental joint space loss and reactive osteophytes are again noted and  there is peripheral interbody ankylosis and facet ankylosis of C2-C3. From C3-C4 through C7-T1, there is chronic minimal stepwise grade 1 anterolisthesis probably related to facet hypertrophy. No new or worsening alignment abnormality is seen. DEGENERATIVE CHANGES: There is mild disc space loss in the cervical spine, disc space calcifications. No disc herniation or cord compromise is seen. There is multilevel foraminal stenosis facet joint hypertrophy with minimal uncovertebral spurring. Tuv Acquired foraminal stenosis is most severe on the right at C3-C4 and on the right at C5-C6, left at C6-C7. SOFT TISSUES: No prevertebral soft tissue swelling. There are calcific plaques in the proximal cervical ICAs. There is a 2.2 cm hypodense solid nodule in the right lobe of the thyroid , which was previously biopsied in 2019. No follow-up imaging is recommended unless otherwise indicated. There is no laryngeal mass. No appreciable canal hematoma. LUNG APICES: Lung apices are clear. IMPRESSION: 1. No acute intracranial CT findings or depressed skull fractures. 2. No acute fracture or traumatic malalignment of the cervical spine. 3. Osteopenia and degenerative change of the cervical spine. Stable exam. 4. Moderately advanced cerebral atrophy with atrophic ventriculomegaly. Stable exam. Electronically signed by: Francis Quam MD 05/04/2024 02:52 AM EST RP Workstation: [id number]   CT Cervical Spine Wo Contrast Result Date: 05/04/2024 EXAM: CT HEAD AND CERVICAL SPINE 05/04/2024 02:22:26 AM TECHNIQUE: CT of the head and cervical spine was performed without the administration of intravenous contrast. Multiplanar reformatted images are provided for review. Automated exposure control, iterative reconstruction, and/or weight based adjustment of the mA/kV was utilized to reduce the radiation dose to as low as reasonably achievable. COMPARISON: Cervical spine CT 11/06/2023, and head CT 01/01/2024. CLINICAL HISTORY: Head trauma,  minor (Age >= 65y). Frequent falls. FINDINGS: CT HEAD BRAIN AND VENTRICLES: No acute intracranial hemorrhage. No mass effect or midline shift. No abnormal extra-axial fluid collection. No evidence of acute infarct. There is moderately advanced cerebral atrophy, with atrophic ventriculomegaly and mild to moderate cerebellar atrophy. Relatively mild small vessel disease of the cerebral white matter. ORBITS: There are old lens replacements, otherwise negative orbits. SINUSES AND MASTOIDS: No acute abnormality. SOFT TISSUES AND SKULL: No acute skull fracture. No acute soft tissue abnormality. No depressed skull fracture. There is no visible scalp hematoma. VASCULATURE: The carotid siphons are moderately calcified. No hyperdense vessel is seen. CT CERVICAL SPINE BONES AND ALIGNMENT: There is osteopenia without evidence of fractures or focal destructive bone lesion. Bone-on-bone anterior atlantodental joint space loss and reactive osteophytes are again noted and there is peripheral interbody  ankylosis and facet ankylosis of C2-C3. From C3-C4 through C7-T1, there is chronic minimal stepwise grade 1 anterolisthesis probably related to facet hypertrophy. No new or worsening alignment abnormality is seen. DEGENERATIVE CHANGES: There is mild disc space loss in the cervical spine, disc space calcifications. No disc herniation or cord compromise is seen. There is multilevel foraminal stenosis facet joint hypertrophy with minimal uncovertebral spurring. Tuv Acquired foraminal stenosis is most severe on the right at C3-C4 and on the right at C5-C6, left at C6-C7. SOFT TISSUES: No prevertebral soft tissue swelling. There are calcific plaques in the proximal cervical ICAs. There is a 2.2 cm hypodense solid nodule in the right lobe of the thyroid , which was previously biopsied in 2019. No follow-up imaging is recommended unless otherwise indicated. There is no laryngeal mass. No appreciable canal hematoma. LUNG APICES: Lung apices  are clear. IMPRESSION: 1. No acute intracranial CT findings or depressed skull fractures. 2. No acute fracture or traumatic malalignment of the cervical spine. 3. Osteopenia and degenerative change of the cervical spine. Stable exam. 4. Moderately advanced cerebral atrophy with atrophic ventriculomegaly. Stable exam. Electronically signed by: Francis Quam MD 05/04/2024 02:52 AM EST RP Workstation: [id number]   DG Chest Portable 1 View Result Date: 05/04/2024 EXAM: 1 VIEW(S) XRAY OF THE CHEST 05/04/2024 01:17:00 AM COMPARISON: 11/29/2023 CLINICAL HISTORY: sob FINDINGS: LUNGS AND PLEURA: Moderate right basilar atelectasis is noted. No pleural effusion. No pneumothorax. HEART AND MEDIASTINUM: Atherosclerotic plaque. No acute abnormality of the cardiac and mediastinal silhouettes. BONES AND SOFT TISSUES: No acute osseous abnormality. IMPRESSION: 1. Moderate right basilar atelectasis, which may account for dyspnea. Electronically signed by: Oneil Devonshire MD 05/04/2024 01:27 AM EST RP Workstation: [id number]   DG Wrist Complete Left Result Date: 05/04/2024 EXAM: 3 or more VIEW(S) XRAY OF THE LEFT WRIST 05/04/2024 01:17:00 AM COMPARISON: None available. CLINICAL HISTORY: Weakness FINDINGS: BONES AND JOINTS: Acute nondisplaced extra-articular fracture at the base of the proximal phalanx of the thumb. SOFT TISSUES: The soft tissues are unremarkable. IMPRESSION: 1. Acute nondisplaced extra-articular fracture at the base of the proximal phalanx of the thumb. Electronically signed by: Oneil Devonshire MD 05/04/2024 01:23 AM EST RP Workstation: [id number]   DG Pelvis Portable Result Date: 05/04/2024 EXAM: 1 or 2 VIEW(S) XRAY OF THE PELVIS 05/04/2024 01:17:00 AM COMPARISON: 03/19/2014 CLINICAL HISTORY: SOB, weakness. FINDINGS: BONES AND JOINTS: No acute fracture. No malalignment. Severe degenerative changes of the lower lumbar spine. SOFT TISSUES: Aortic atherosclerosis. IMPRESSION: 1. No acute findings. 2. Severe  degenerative changes of the lower lumbar spine. Electronically signed by: Oneil Devonshire MD 05/04/2024 01:21 AM EST RP Workstation: [id number]    Microbiology: Results for orders placed or performed during the hospital encounter of 05/04/24  Resp panel by RT-PCR (RSV, Flu A&B, Covid) Anterior Nasal Swab     Status: None   Collection Time: 05/04/24 12:46 AM   Specimen: Anterior Nasal Swab  Result Value Ref Range Status   SARS Coronavirus 2 by RT PCR NEGATIVE NEGATIVE Final    Comment: (NOTE) SARS-CoV-2 target nucleic acids are NOT DETECTED.  The SARS-CoV-2 RNA is generally detectable in upper respiratory specimens during the acute phase of infection. The lowest concentration of SARS-CoV-2 viral copies this assay can detect is 138 copies/mL. A negative result does not preclude SARS-Cov-2 infection and should not be used as the sole basis for treatment or other patient management decisions. A negative result may occur with  improper specimen collection/handling, submission of specimen other than nasopharyngeal swab, presence of viral mutation(s) within  the areas targeted by this assay, and inadequate number of viral copies(<138 copies/mL). A negative result must be combined with clinical observations, patient history, and epidemiological information. The expected result is Negative.  Fact Sheet for Patients:  bloggercourse.com  Fact Sheet for Healthcare Providers:  seriousbroker.it  This test is no t yet approved or cleared by the [place]  FDA and  has been authorized for detection and/or diagnosis of SARS-CoV-2 by FDA under an Emergency Use Authorization (EUA). This EUA will remain  in effect (meaning this test can be used) for the duration of the COVID-19 declaration under Section 564(b)(1) of the Act, 21 U.S.C.section 360bbb-3(b)(1), unless the authorization is terminated  or revoked sooner.       Influenza A by PCR NEGATIVE  NEGATIVE Final   Influenza B by PCR NEGATIVE NEGATIVE Final    Comment: (NOTE) The Xpert Xpress SARS-CoV-2/FLU/RSV plus assay is intended as an aid in the diagnosis of influenza from Nasopharyngeal swab specimens and should not be used as a sole basis for treatment. Nasal washings and aspirates are unacceptable for Xpert Xpress SARS-CoV-2/FLU/RSV testing.  Fact Sheet for Patients: bloggercourse.com  Fact Sheet for Healthcare Providers: seriousbroker.it  This test is not yet approved or cleared by the [place]  FDA and has been authorized for detection and/or diagnosis of SARS-CoV-2 by FDA under an Emergency Use Authorization (EUA). This EUA will remain in effect (meaning this test can be used) for the duration of the COVID-19 declaration under Section 564(b)(1) of the Act, 21 U.S.C. section 360bbb-3(b)(1), unless the authorization is terminated or revoked.     Resp Syncytial Virus by PCR NEGATIVE NEGATIVE Final    Comment: (NOTE) Fact Sheet for Patients: bloggercourse.com  Fact Sheet for Healthcare Providers: seriousbroker.it  This test is not yet approved or cleared by the [place]  FDA and has been authorized for detection and/or diagnosis of SARS-CoV-2 by FDA under an Emergency Use Authorization (EUA). This EUA will remain in effect (meaning this test can be used) for the duration of the COVID-19 declaration under Section 564(b)(1) of the Act, 21 U.S.C. section 360bbb-3(b)(1), unless the authorization is terminated or revoked.  Performed at [facility], [address] [place], [place] [address]   Respiratory (~20 pathogens) panel by PCR     Status: None   Collection Time: 05/04/24  9:57 AM   Specimen: Nasopharyngeal Swab; Respiratory  Result Value Ref Range Status   Adenovirus NOT DETECTED NOT DETECTED Final   Coronavirus 229E NOT DETECTED NOT  DETECTED Final    Comment: (NOTE) The Coronavirus on the Respiratory Panel, DOES NOT test for the novel  Coronavirus (2019 nCoV)    Coronavirus HKU1 NOT DETECTED NOT DETECTED Final   Coronavirus NL63 NOT DETECTED NOT DETECTED Final   Coronavirus OC43 NOT DETECTED NOT DETECTED Final   Metapneumovirus NOT DETECTED NOT DETECTED Final   Rhinovirus / Enterovirus NOT DETECTED NOT DETECTED Final   Influenza A NOT DETECTED NOT DETECTED Final   Influenza B NOT DETECTED NOT DETECTED Final   Parainfluenza Virus 1 NOT DETECTED NOT DETECTED Final   Parainfluenza Virus 2 NOT DETECTED NOT DETECTED Final   Parainfluenza Virus 3 NOT DETECTED NOT DETECTED Final   Parainfluenza Virus 4 NOT DETECTED NOT DETECTED Final   Respiratory Syncytial Virus NOT DETECTED NOT DETECTED Final   Bordetella pertussis NOT DETECTED NOT DETECTED Final   Bordetella Parapertussis NOT DETECTED NOT DETECTED Final   Chlamydophila pneumoniae NOT DETECTED NOT DETECTED Final   Mycoplasma pneumoniae NOT DETECTED  NOT DETECTED Final    Comment: Performed at [facility], [address], [place], [place] [address]   *Note: Due to a large number of results and/or encounters for the requested time period, some results have not been displayed. A complete set of results can be found in Results Review.    Labs: CBC: Recent Labs  Lab 05/04/24 0046 05/06/24 0407 05/07/24 0653  WBC 6.3 7.0 6.4  NEUTROABS 3.4  --   --   HGB 11.8* 11.7* 11.6*  HCT 36.1* 34.8* 33.0*  MCV 96.3 94.3 90.2  PLT 228 230 256   Basic Metabolic Panel: Recent Labs  Lab 05/04/24 0046 05/05/24 0831 05/06/24 0407 05/07/24 0653  NA 135 135 133* 132*  K 4.6 4.0 4.3 4.0  CL 95* 98 96* 96*  CO2 32 27 24 24   GLUCOSE 138* 122* 135* 120*  BUN 32* 22 26* 21  CREATININE 1.54* 1.34* 1.29* 1.02  CALCIUM  10.0 9.5 9.4 9.9   Liver Function Tests: Recent Labs  Lab 05/04/24 0046 05/06/24 0407  AST 70* 38  ALT 32 21  ALKPHOS 110 90  BILITOT 0.4 0.5   PROT 6.4* 6.3*  ALBUMIN 3.7 3.4*   CBG: Recent Labs  Lab 05/06/24 0756 05/06/24 1201 05/06/24 1652 05/06/24 2156 05/07/24 0826  GLUCAP 124* 184* 155* 100* 123*    Discharge time spent: greater than 30 minutes.  Signed: Charlie Patterson, MD [facility] 05/07/2024

## 2024-05-07 NOTE — Assessment & Plan Note (Signed)
 Creatinine 1.54 and down to 1.02 upon discharge.  Likely has element on ckd stage 2 underlying

## 2024-05-07 NOTE — TOC Transition Note (Signed)
 Transition of Care [facility]) - Discharge Note   Patient Details  Name: James Hood MRN: [id number] Date of Birth: 1940/11/11  Transition of Care [facility]) CM/SW Contact:  Alvaro Louder, LCSW Phone Number: 05/07/2024, 9:22 AM   Clinical Narrative:   LCSWA received insurance approval for patient to admit to SNF [facility]. LCSWA confirmed with MD that patient is stable for discharge. LCSWA notified the patient and daughter and they are in agreement with discharge . LCSWA confirmed bed is available at [facility] Transport arranged with [facility] for next available.   57B, Number to call report: [phone]   [facility] signing off   Final next level of care: Skilled Nursing Facility Barriers to Discharge: No Barriers Identified   Patient Goals and CMS Choice Patient states their goals for this hospitalization and ongoing recovery are:: patient unable to state CMS Medicare.gov Compare Post Acute Care list provided to:: Patient Choice offered to / list presented to : Patient, Spouse      Discharge Placement              Patient chooses bed at: [facility] Patient to be transferred to facility by: [facility] Name of family member notified: Family Patient and family notified of of transfer: 05/07/24  Discharge Plan and Services Additional resources added to the After Visit Summary for     Discharge Planning Services: CM Consult                                 Social Drivers of Health (SDOH) Interventions SDOH Screenings   Food Insecurity: No Food Insecurity (05/04/2024)  Housing: Low Risk (05/04/2024)  Transportation Needs: No Transportation Needs (05/05/2024)  Utilities: Not At Risk (05/05/2024)  Alcohol  Screen: Low Risk (12/31/2023)  Depression (PHQ2-9): Low Risk (01/01/2024)  Financial Resource Strain: Medium Risk (12/31/2023)  Physical Activity: Insufficiently Active (12/31/2023)  Social Connections: Socially Integrated (05/05/2024)  Stress: No Stress  Concern Present (12/31/2023)  Tobacco Use: High Risk (05/04/2024)  Health Literacy: Inadequate Health Literacy (01/01/2024)     Readmission Risk Interventions    05/04/2024   12:18 PM  Readmission Risk Prevention Plan  Transportation Screening Complete  Medication Review (RN Care Manager) Complete  PCP or Specialist appointment within 3-5 days of discharge Complete  HRI or Home Care Consult Complete  SW Recovery Care/Counseling Consult Not Complete  SW Consult Not Complete Comments NA  Palliative Care Screening Not Applicable  Skilled Nursing Facility Complete

## 2024-05-20 ENCOUNTER — Telehealth: Payer: Self-pay | Admitting: Internal Medicine

## 2024-05-20 NOTE — Telephone Encounter (Signed)
 Facility called to reschedule a missed appointment, patient is not seen in this [facility]. Called facility to let them know. Message left.

## 2024-05-26 ENCOUNTER — Telehealth: Payer: Self-pay

## 2024-05-26 NOTE — Telephone Encounter (Signed)
 Copied from CRM [id number]. Topic: Clinical - Home Health Verbal Orders >> May 26, 2024  8:17 AM Deleta RAMAN wrote: Darice Collum from [facility] [phone] is calling to see if patient pcp would give authorization for the verbal orders she has which is pt and ot. Please follow with the number above. Needs a call back today.

## 2024-05-27 ENCOUNTER — Ambulatory Visit: Admitting: Nurse Practitioner

## 2024-05-27 NOTE — Telephone Encounter (Signed)
 Detailed vm left providing verbal orders for pt

## 2024-06-01 ENCOUNTER — Ambulatory Visit: Payer: Self-pay | Admitting: Internal Medicine

## 2024-06-01 ENCOUNTER — Ambulatory Visit (INDEPENDENT_AMBULATORY_CARE_PROVIDER_SITE_OTHER): Admitting: Internal Medicine

## 2024-06-01 ENCOUNTER — Ambulatory Visit
Admission: RE | Admit: 2024-06-01 | Discharge: 2024-06-01 | Disposition: A | Source: Ambulatory Visit | Attending: Internal Medicine

## 2024-06-01 ENCOUNTER — Encounter: Payer: Self-pay | Admitting: Internal Medicine

## 2024-06-01 ENCOUNTER — Ambulatory Visit
Admission: RE | Admit: 2024-06-01 | Discharge: 2024-06-01 | Disposition: A | Discharge status: Home / Self Care | Attending: Internal Medicine | Admitting: Internal Medicine

## 2024-06-01 VITALS — BP 122/78 | HR 89 | Temp 97.5°F | Ht 70.0 in | Wt 167.6 lb

## 2024-06-01 DIAGNOSIS — N1831 Chronic kidney disease, stage 3a: Secondary | ICD-10-CM | POA: Diagnosis not present

## 2024-06-01 DIAGNOSIS — I10 Essential (primary) hypertension: Secondary | ICD-10-CM

## 2024-06-01 DIAGNOSIS — F331 Major depressive disorder, recurrent, moderate: Secondary | ICD-10-CM | POA: Diagnosis not present

## 2024-06-01 DIAGNOSIS — Z7984 Long term (current) use of oral hypoglycemic drugs: Secondary | ICD-10-CM

## 2024-06-01 DIAGNOSIS — G2581 Restless legs syndrome: Secondary | ICD-10-CM | POA: Diagnosis not present

## 2024-06-01 DIAGNOSIS — S62512D Displaced fracture of proximal phalanx of left thumb, subsequent encounter for fracture with routine healing: Secondary | ICD-10-CM | POA: Diagnosis not present

## 2024-06-01 DIAGNOSIS — I129 Hypertensive chronic kidney disease with stage 1 through stage 4 chronic kidney disease, or unspecified chronic kidney disease: Secondary | ICD-10-CM | POA: Diagnosis not present

## 2024-06-01 DIAGNOSIS — N183 Chronic kidney disease, stage 3 unspecified: Secondary | ICD-10-CM

## 2024-06-01 DIAGNOSIS — R059 Cough, unspecified: Secondary | ICD-10-CM | POA: Insufficient documentation

## 2024-06-01 DIAGNOSIS — E1122 Type 2 diabetes mellitus with diabetic chronic kidney disease: Secondary | ICD-10-CM

## 2024-06-01 LAB — COMPREHENSIVE METABOLIC PANEL WITH GFR
ALT: 17 U/L (ref 3–53)
AST: 25 U/L (ref 5–37)
Albumin: 3.7 g/dL (ref 3.5–5.2)
Alkaline Phosphatase: 77 U/L (ref 39–117)
BUN: 32 mg/dL — ABNORMAL HIGH (ref 6–23)
CO2: 27 meq/L (ref 19–32)
Calcium: 8.8 mg/dL (ref 8.4–10.5)
Chloride: 95 meq/L — ABNORMAL LOW (ref 96–112)
Creatinine, Ser: 1.47 mg/dL (ref 0.40–1.50)
GFR: 43.58 mL/min — ABNORMAL LOW
Glucose, Bld: 270 mg/dL — ABNORMAL HIGH (ref 70–99)
Potassium: 4.6 meq/L (ref 3.5–5.1)
Sodium: 131 meq/L — ABNORMAL LOW (ref 135–145)
Total Bilirubin: 0.4 mg/dL (ref 0.2–1.2)
Total Protein: 6.2 g/dL (ref 6.0–8.3)

## 2024-06-01 MED ORDER — METFORMIN HCL ER 500 MG PO TB24: 500.0000 mg | ORAL_TABLET | Freq: Every day | ORAL | 1 refills | Status: AC

## 2024-06-01 MED ORDER — DAPAGLIFLOZIN PROPANEDIOL 10 MG PO TABS: 10.0000 mg | ORAL_TABLET | Freq: Every day | ORAL | 1 refills | Status: AC

## 2024-06-01 MED ORDER — LINAGLIPTIN 5 MG PO TABS: 5.0000 mg | ORAL_TABLET | Freq: Every day | ORAL | 1 refills | Status: AC

## 2024-06-01 MED ORDER — LISINOPRIL-HYDROCHLOROTHIAZIDE 20-25 MG PO TABS: 1.0000 | ORAL_TABLET | Freq: Every day | ORAL | 3 refills | Status: AC

## 2024-06-01 NOTE — Progress Notes (Signed)
 "  Acute Office Visit  Subjective:     Patient ID: James Hood, male    DOB: 21-Apr-1941, 84 y.o.   MRN: [id number]  Chief Complaint  Patient presents with   Hospitalization Follow-up    Discussed the use of AI scribe software for clinical note transcription with the patient, who gave verbal consent to proceed.  History of Present Illness Glenn Gullickson is an 84 year old male with a history of falls and chronic kidney disease who presents for follow-up after recent hospitalization.  Falls and medication adjustments - Recent hospitalization and nursing home stay due to multiple falls likely secondary to polypharmacy - Medication regimen adjusted during hospitalization, including reduction in gabapentin , Cymbalta , and Seroquel   Restless leg syndrome - Symptoms of restless leg syndrome, worsening in late afternoon and evening - Currently taking gabapentin  300 mg three times daily, reduced from previous dose of 3600 mg per day  Chronic kidney disease - Chronic kidney disease worsened during hospitalization - Kidney function returned to baseline by discharge - Increased fluid intake since leaving nursing home to maintain hydration  Diabetes mellitus - Diabetes with recent changes in medication regimen during hospitalization - Metformin  and Tradjenta  were discontinued during hospital stay, currently resumed - Farxiga  not currently available, previously received in hospital - A1c was 7.5% during hospitalization - Blood glucose levels elevated, reaching the 200s  Hypertension - Hypertension managed with combination pill of lisinopril  and hydrochlorothiazide  - Currently taking combination pill, though medication list only reflects lisinopril  - Blood pressure has been stable  Respiratory symptoms - Non-productive cough persisting for several weeks, attributed to a cold - No fever, chills, or shortness of breath - Uses nasal spray to maintain airway clearance -  Occasional use of Mucinex  and Vicks 44 for cough  Thumb fracture - Thumb fracture sustained from a fall, confirmed by x-ray - Currently wearing a splint for thumb fracture    Review of Systems  Constitutional: Negative.   HENT: Negative.    Respiratory:  Positive for cough. Negative for sputum production, shortness of breath and wheezing.   Cardiovascular: Negative.   Gastrointestinal: Negative.   Musculoskeletal:  Negative for falls.       Complains of chronic lower extremity pain worse at the right which he attributes to his restless leg syndrome and decrease gabapentin  dosage  Neurological: Negative.   Psychiatric/Behavioral: Negative.          Objective:    BP 122/78   Pulse 89   Temp (!) 97.5 F (36.4 C)   Ht 5' 10 (1.778 m)   Wt 167 lb 9.6 oz (76 kg)   SpO2 98%   BMI 24.05 kg/m    Physical Exam Constitutional:      Appearance: Normal appearance.  HENT:     Head: Normocephalic and atraumatic.  Cardiovascular:     Rate and Rhythm: Normal rate and regular rhythm.     Heart sounds: Normal heart sounds.  Pulmonary:     Effort: Pulmonary effort is normal.     Breath sounds: Normal breath sounds. No wheezing, rhonchi or rales.  Abdominal:     General: Bowel sounds are normal. There is no distension.     Palpations: Abdomen is soft.     Tenderness: There is no abdominal tenderness. There is no guarding or rebound.  Musculoskeletal:        General: No swelling or tenderness.     Right lower leg: No edema.     Left lower  leg: No edema.     Comments: Left thumb spica splint intact  Neurological:     Mental Status: He is alert.  Psychiatric:        Mood and Affect: Mood normal.        Behavior: Behavior normal.     No results found for any visits on 06/01/24.      Assessment & Plan:   Problem List Items Addressed This Visit       Cardiovascular and Mediastinum   Hypertension - Primary (Chronic)   - This problem is chronic and stable  - Blood  pressure today is 122/78 (at goal) -Patient was discharged from recent hospital stay with metoprolol  25 mg daily and lisinopril  20 mg daily (changed from his home medication of lisinopril /hydrochlorothiazide  20/25 mg) -However, patient is currently taking metoprolol  25 mg daily as well as lisinopril /HCTZ 20/25 mg as the only had combination pill available at home -Would continue with current metoprolol  and lisinopril /HCTZ dosing -Will recheck CMP today -No further workup at this time      Relevant Medications   lisinopril -hydrochlorothiazide  (ZESTORETIC ) 20-25 MG tablet   Type 2 DM with CKD stage 3 and hypertension (HCC)   - This problem is chronic and stable -Last A1c in December was 7.5 -Patient has metformin  and Tradjenta  held after discharge but this has since been resumed -Will continue the metformin  500 mg daily, Tradjenta  5 mg daily (refills called into pharmacy) - Patient states that he has been out of Farxiga . -Will refill Farxiga  10 mg daily for him  today -Will check urine microalbumin to creatinine ratio -Follow-up with PCP for regular diabetes management -No further workup at this time      Relevant Medications   lisinopril -hydrochlorothiazide  (ZESTORETIC ) 20-25 MG tablet   metFORMIN  (GLUCOPHAGE -XR) 500 MG 24 hr tablet   linagliptin  (TRADJENTA ) 5 MG TABS tablet   dapagliflozin  propanediol (FARXIGA ) 10 MG TABS tablet   Other Relevant Orders   Microalbumin / creatinine urine ratio     Musculoskeletal and Integument   Thumb fracture   - Patient had a left thumb fracture after a fall in December -He was referred to orthopedics at this and followed up with them -Will continue with thumb spica splint for now (patient is currently wearing this) -Will follow-up with Ortho in about 4 to 6 weeks for repeat x-rays and to assess for fracture healing -No further workup at this time        Genitourinary   CKD stage 3a, GFR 45-59 ml/min (HCC)   - Patient has history of CKD  stage III a.  During his hospitalization he did have an AKI on his CKD but his GFR was at baseline on discharge -Will recheck his CMP today -Continue with Farxiga  and lisinopril  - Will check urine microalbumin to creatinine ratio as well -No indication for nephrology referral at this time      Relevant Orders   Comprehensive metabolic panel with GFR     Other   RLS (restless legs syndrome) (Chronic)   - This problem is chronic and uncontrolled -Patient states that this is previously well-controlled on gabapentin  at his prior dose (3600 milligrams daily).  During his recent hospitalization his gabapentin  dose was reduced to 900 mg daily (300 mg 3 times a day). -Patient states that his symptoms worsened towards the evening.  I advised him to take an additional gabapentin  300 mg in the afternoon to see if this will help with the symptoms (he will take 300 mg  in the morning, 600 mg in the afternoon and 300 mg at night) -No further workup at this time      Cough   - Patient has a history of chronic cough but over the last 2 to 3 weeks has complained of worsening cough and nasal congestion -States that his roommate was sick at rehab and he suspects that he caught this from him -His cough is slowly improving.  He is taking Mucinex  for symptom relief -No fevers or chills.  No shortness of breath -Will check chest x-ray given persistence of symptoms to rule out pneumonia but this is unlikely -Symptomatic management for now -No further workup at this time      Relevant Orders   DG Chest 2 View   Depression, major, recurrent, moderate (HCC)   - This problem is chronic and stable -Patient follows with psychiatry as an outpatient for this -During hospitalization he had multiple medication dosages reduced including Cymbalta , Seroquel  and gabapentin  -Will continue current doses for now -PHQ 9 score is 2 -He will follow-up with psychiatry -No further workup at this time       Meds ordered  this encounter  Medications   lisinopril -hydrochlorothiazide  (ZESTORETIC ) 20-25 MG tablet    Sig: Take 1 tablet by mouth daily.    Dispense:  90 tablet    Refill:  3   metFORMIN  (GLUCOPHAGE -XR) 500 MG 24 hr tablet    Sig: Take 1 tablet (500 mg total) by mouth daily with breakfast.    Dispense:  90 tablet    Refill:  1   linagliptin  (TRADJENTA ) 5 MG TABS tablet    Sig: Take 1 tablet (5 mg total) by mouth daily.    Dispense:  90 tablet    Refill:  1   dapagliflozin  propanediol (FARXIGA ) 10 MG TABS tablet    Sig: Take 1 tablet (10 mg total) by mouth daily.    Dispense:  90 tablet    Refill:  1    No follow-ups on file.  Irem Stoneham, MD   "

## 2024-06-01 NOTE — Assessment & Plan Note (Signed)
-   Patient had a left thumb fracture after a fall in December -He was referred to orthopedics at this and followed up with them -Will continue with thumb spica splint for now (patient is currently wearing this) -Will follow-up with Ortho in about 4 to 6 weeks for repeat x-rays and to assess for fracture healing -No further workup at this time

## 2024-06-01 NOTE — Assessment & Plan Note (Signed)
-   This problem is chronic and stable  - Blood pressure today is 122/78 (at goal) -Patient was discharged from recent hospital stay with metoprolol  25 mg daily and lisinopril  20 mg daily (changed from his home medication of lisinopril /hydrochlorothiazide  20/25 mg) -However, patient is currently taking metoprolol  25 mg daily as well as lisinopril /HCTZ 20/25 mg as the only had combination pill available at home -Would continue with current metoprolol  and lisinopril /HCTZ dosing -Will recheck CMP today -No further workup at this time

## 2024-06-01 NOTE — Assessment & Plan Note (Signed)
-   This problem is chronic and stable -Last A1c in December was 7.5 -Patient has metformin  and Tradjenta  held after discharge but this has since been resumed -Will continue the metformin  500 mg daily, Tradjenta  5 mg daily (refills called into pharmacy) - Patient states that he has been out of Farxiga . -Will refill Farxiga  10 mg daily for him  today -Will check urine microalbumin to creatinine ratio -Follow-up with PCP for regular diabetes management -No further workup at this time

## 2024-06-01 NOTE — Assessment & Plan Note (Signed)
-   This problem is chronic and stable -Patient follows with psychiatry as an outpatient for this -During hospitalization he had multiple medication dosages reduced including Cymbalta , Seroquel  and gabapentin  -Will continue current doses for now -PHQ 9 score is 2 -He will follow-up with psychiatry -No further workup at this time

## 2024-06-01 NOTE — Assessment & Plan Note (Signed)
-   Patient has history of CKD stage III a.  During his hospitalization he did have an AKI on his CKD but his GFR was at baseline on discharge -Will recheck his CMP today -Continue with Farxiga  and lisinopril  - Will check urine microalbumin to creatinine ratio as well -No indication for nephrology referral at this time

## 2024-06-01 NOTE — Assessment & Plan Note (Signed)
-   This problem is chronic and uncontrolled -Patient states that this is previously well-controlled on gabapentin  at his prior dose (3600 milligrams daily).  During his recent hospitalization his gabapentin  dose was reduced to 900 mg daily (300 mg 3 times a day). -Patient states that his symptoms worsened towards the evening.  I advised him to take an additional gabapentin  300 mg in the afternoon to see if this will help with the symptoms (he will take 300 mg in the morning, 600 mg in the afternoon and 300 mg at night) -No further workup at this time

## 2024-06-01 NOTE — Patient Instructions (Signed)
" °  VISIT SUMMARY: James Hood, you came in today for a follow-up after your recent hospitalization. We discussed your history of falls, chronic kidney disease, diabetes, hypertension, restless leg syndrome, depression, and a recent thumb fracture. We reviewed your current medications and made some adjustments to better manage your conditions.  YOUR PLAN: -CLOSED DISPLACED FRACTURE OF PROXIMAL PHALANX OF LEFT THUMB: Your thumb fracture is healing well. Continue wearing the splint for six weeks and follow up with the orthopedic doctor in 4-6 weeks for repeat x-rays.  -CHRONIC KIDNEY DISEASE STAGE 3A: Your kidney function, which worsened during your hospitalization, has returned to baseline. We have ordered kidney function tests and a urine test to check protein levels. Continue to stay hydrated.  -TYPE 2 DIABETES MELLITUS: Your blood sugar levels were high during your hospital stay, likely due to dietary factors. Your A1c was 7.5%. We have refilled your Farxiga  and Tradjenta  prescriptions. Continue taking metformin  500 mg once daily.  -PRIMARY HYPERTENSION: Your blood pressure is well-controlled with your current medication. Continue taking lisinopril  alone and monitor your blood pressure and kidney function.  -RESTLESS LEGS SYNDROME: Your symptoms have worsened after reducing your gabapentin  dosage. We have increased your afternoon dose to 600 mg to help manage the pain.  -DEPRESSION: Your depression symptoms are well-controlled with your current medication regimen. Continue with your current psychiatric medications.  -ACUTE UPPER RESPIRATORY INFECTION: You have a persistent non-productive cough, likely due to a viral infection. We have ordered a chest x-ray to rule out pneumonia. Continue using Mucinex  as needed.  INSTRUCTIONS: Follow up with the orthopedic doctor in 4-6 weeks for repeat x-rays of your thumb. Continue to monitor your blood pressure and kidney function. Stay hydrated and take your  medications as prescribed. We will review your kidney function tests and urine test results at your next visit.                      Contains text generated by [employer]bridge.                                 Contains text generated by [employer]bridge.   "

## 2024-06-01 NOTE — Assessment & Plan Note (Signed)
-   Patient has a history of chronic cough but over the last 2 to 3 weeks has complained of worsening cough and nasal congestion -States that his roommate was sick at rehab and he suspects that he caught this from him -His cough is slowly improving.  He is taking Mucinex  for symptom relief -No fevers or chills.  No shortness of breath -Will check chest x-ray given persistence of symptoms to rule out pneumonia but this is unlikely -Symptomatic management for now -No further workup at this time

## 2024-06-08 NOTE — Assessment & Plan Note (Signed)
-   CMP done at last visit showed mildly worse GFR possibly secondary to inadequate fluid intake as well as being on lisinopril /hydrochlorothiazide  - Encouraged adequate hydration and have patient follow-up in 1 week for repeat blood work -No further workup at this time

## 2024-06-10 ENCOUNTER — Telehealth: Payer: Self-pay

## 2024-06-10 NOTE — Telephone Encounter (Signed)
 [employer] forms placed in provider to be signed folder

## 2024-06-12 ENCOUNTER — Ambulatory Visit: Payer: Self-pay

## 2024-06-12 NOTE — Telephone Encounter (Signed)
 FYI Only or Action Required?: Action required by provider: update on patient condition.  Patient was last seen in primary care on 06/01/2024 by Onesimo Claude, MD.  Called Nurse Triage reporting Fall.  Symptoms began yesterday.  Interventions attempted: Nothing.  Symptoms are: stable.  Triage Disposition: Home Care  Patient/caregiver understands and will follow disposition?: Yes    Message from Chasity T sent at 06/12/2024  1:02 PM EST  Reason for Triage: Tommy from [facility] home health has called to report that patient had fallen in the bathroom on yesterday but was able to get himself back up. He is stiff a little today but he is moving around. He is not showing any signs of bruising or pain as well.    Reason for Disposition  [1] Recent fall AND [2] no injury  Answer Assessment - Initial Assessment Questions Tommy with [facility], contacted clinic with patient on speaker phone, to report fall yesterday. Pt reports falling in his bathroom d/t his socks slipping on the floor. Pt reports falling into the toilet and hitting his R shoulder and R side. Denies hitting head, did not trip, no dizziness. No symptoms after fall, no dizziness, no nausea, no h/a. Pt was able to get himself up. No bruising, no skin tears. Pt assessed via Tommy and no visible injury. Pt does report stiffness to R side. Pt is up walking and VSS, Tommy states orthostatic BP stable. Pt does take ASA daily. No new medications. Pt is scheduled for f/u with PCP 07/29/24. Pt declined appt at this time.     1. MECHANISM: How did the fall happen?     Pt reports standing in bathroom and was wearing socks. Reports socks slipped on the floor and he fell into the toilet and down on his R side  2. DOMESTIC VIOLENCE AND ELDER ABUSE SCREENING: Did you fall because someone pushed you or tried to hurt you? If Yes, ask: Are you safe now?     No   3. ONSET: When did the fall happen? (e.g., minutes, hours, or days  ago)     Yesterday   4. LOCATION: What part of the body hit the ground? (e.g., back, buttocks, head, hips, knees, hands, head, stomach)     R shoulder and R side   5. INJURY: Did you hurt (injure) yourself when you fell? If Yes, ask: What did you injure? Tell me more about this? (e.g., body area; type of injury; pain severity)     Pt reports soreness, no bruising, no skin tears  6. PAIN: Is there any pain? If Yes, ask: How bad is the pain? (e.g., Scale 0-10; or none, mild,      Stiffness; mild   7. SIZE: For cuts, bruises, or swelling, ask: How large is it? (e.g., inches or centimeters)      No   9. OTHER SYMPTOMS: Do you have any other symptoms? (e.g., dizziness, fever, weakness; new-onset or worsening).      None   10. CAUSE: What do you think caused the fall (or falling)? (e.g., dizzy spell, tripped)       Pt reports his socks were slippery on the bathroom floor  Protocols used: Falls and [facility]

## 2024-06-15 ENCOUNTER — Other Ambulatory Visit

## 2024-06-18 ENCOUNTER — Other Ambulatory Visit (INDEPENDENT_AMBULATORY_CARE_PROVIDER_SITE_OTHER)

## 2024-06-18 DIAGNOSIS — N1831 Chronic kidney disease, stage 3a: Secondary | ICD-10-CM | POA: Diagnosis not present

## 2024-06-19 LAB — COMPREHENSIVE METABOLIC PANEL WITH GFR
ALT: 17 U/L (ref 3–53)
AST: 36 U/L (ref 5–37)
Albumin: 3.7 g/dL (ref 3.5–5.2)
Alkaline Phosphatase: 114 U/L (ref 39–117)
BUN: 26 mg/dL — ABNORMAL HIGH (ref 6–23)
CO2: 26 meq/L (ref 19–32)
Calcium: 9.2 mg/dL (ref 8.4–10.5)
Chloride: 100 meq/L (ref 96–112)
Creatinine, Ser: 1.21 mg/dL (ref 0.40–1.50)
GFR: 55.03 mL/min — ABNORMAL LOW
Glucose, Bld: 210 mg/dL — ABNORMAL HIGH (ref 70–99)
Potassium: 4.3 meq/L (ref 3.5–5.1)
Sodium: 136 meq/L (ref 135–145)
Total Bilirubin: 0.3 mg/dL (ref 0.2–1.2)
Total Protein: 6.1 g/dL (ref 6.0–8.3)

## 2024-06-22 ENCOUNTER — Ambulatory Visit: Payer: Self-pay | Admitting: Internal Medicine

## 2024-06-24 ENCOUNTER — Telehealth: Payer: Self-pay

## 2024-06-24 NOTE — Telephone Encounter (Signed)
 [facility] forms placed in provider folder

## 2024-06-26 ENCOUNTER — Ambulatory Visit: Admitting: Nurse Practitioner

## 2024-06-26 ENCOUNTER — Encounter: Payer: Self-pay | Admitting: Nurse Practitioner

## 2024-06-26 DIAGNOSIS — N400 Enlarged prostate without lower urinary tract symptoms: Secondary | ICD-10-CM

## 2024-06-26 DIAGNOSIS — G2581 Restless legs syndrome: Secondary | ICD-10-CM

## 2024-06-26 MED ORDER — PRAMIPEXOLE DIHYDROCHLORIDE 0.125 MG PO TABS: ORAL_TABLET | ORAL | 3 refills | Status: AC

## 2024-06-26 MED ORDER — TAMSULOSIN HCL 0.4 MG PO CAPS: 0.8000 mg | ORAL_CAPSULE | Freq: Every day | ORAL | 3 refills | Status: AC

## 2024-06-26 NOTE — Progress Notes (Unsigned)
 " Leron Glance, NP-C Phone: [phone]  Discussed the use of AI scribe software for clinical note transcription with the patient, who gave verbal consent to proceed.  History of Present Illness   James Hood is an 84 year old male with dementia who presents with recent episodes of confusion and falls.  Since just before Thanksgiving, he has experienced a decline in his condition, characterized by increased mumbling and frequent falls. Two weeks before Christmas, he fell four times in one day, and one evening he became unresponsive while sitting in his recliner, prompting a call to 911. His oxygen saturation was noted to be 82% by EMS, and he was hospitalized for three days, during which he does not recall the events.  During one of his falls, he injured and broke his thumb. While hospitalized, he removed his IVs and the splint on his thumb. After the hospital stay, he was transferred to a rehab facility for 20 days where his medications were adjusted.  Since discharge from the rehab facility on January 8th, he has had a few days of being 'a little out of it,' with difficulty comprehending conversations. His caregiver emphasizes the importance of hydration, noting that dehydration seems to worsen his condition. His caregiver is vigilant about his fluid intake to prevent further decline.  He is currently on a medication regimen that includes metoprolol , lisinopril , hydrochlorothiazide , metformin , Tradjenta , and gabapentin . There is a concern about not having a prescription for Farxiga , which he previously received through patient assistance. His gabapentin  dosage was adjusted to 300 mg in the morning, 600 mg in the afternoon, and 300 mg at bedtime for restless leg syndrome.  He has a history of high blood sugar levels, with recent readings around 150 mg/dL. His A1c was 7.5% in December. He is also on Aricept  for memory loss and Seroquel  and Wellbutrin  for mood stabilization. His caregiver  notes a change in his personality, with increased grouchiness and argumentativeness, which is a concern for her.  No chest pain, shortness of breath, and dizziness, although his caregiver notes occasional dizziness.        Tobacco Use History[1]  Medications Ordered Prior to Encounter[2]   ROS see history of present illness  Objective  Physical Exam Vitals:   06/26/24 1103  BP: 122/64  Pulse: 75  Temp: 97.6 F (36.4 C)  SpO2: 95%    BP Readings from Last 3 Encounters:  06/26/24 122/64  06/01/24 122/78  05/07/24 127/74   Wt Readings from Last 3 Encounters:  06/26/24 171 lb 3.2 oz (77.7 kg)  06/01/24 167 lb 9.6 oz (76 kg)  05/04/24 161 lb 2.5 oz (73.1 kg)    Physical Exam   Assessment/Plan: Please see individual problem list.  Assessment and Plan    Dementia with altered mental status and repeated falls   He is experiencing progressive mental decline and increased falls, possibly due to dehydration and recent medication changes during rehab. Neurology follow-up is necessary for status assessment and dementia evaluation. Ensure adequate hydration and monitor for altered mental status, seeking hospital care if symptoms recur.  Type 2 diabetes mellitus   His blood sugar was elevated during hospitalization, with recent readings around 150 mg/dL and an J8r of 2.4% in December, which is acceptable but could improve. Obtain Farxiga  through the patient assistance program and monitor blood sugar levels regularly. Repeat A1c in three months.  Hypertension   His hypertension is managed with metoprolol , lisinopril , and hydrochlorothiazide . There has been no recent home monitoring  or symptoms. Continue the current antihypertensive regimen and encourage home blood pressure monitoring.  Chronic kidney disease   There has been recent improvement in kidney function, likely due to hydration. Ensure adequate hydration.  Restless legs syndrome   His gabapentin  regimen is effective.  Continue the current gabapentin  regimen.  Depression   He is experiencing mood changes with increased argumentativeness and mood swings. Current medications include bupropion  and Seroquel , with previous psychiatric care being ineffective. Follow up with psychiatry for mood management and medication efficacy.  General Health Maintenance   Routine health maintenance and medication management were discussed. Schedule a follow-up in three months for blood work and A1c review. Ensure the medication list is accurate and up-to-date.          Benign prostatic hyperplasia without lower urinary tract symptoms  RLS (restless legs syndrome)     Return in about 3 months (around 09/23/2024) for Follow up.   Leron Glance, NP-C [facility] [place]    [1]  Social History Tobacco Use  Smoking Status Every Day   Current packs/day: 0.50   Average packs/day: 0.5 packs/day for 69.1 years (34.5 ttl pk-yrs)   Types: Cigarettes   Start date: 1957   Passive exposure: Current  Smokeless Tobacco Never  [2]  Current Outpatient Medications on File Prior to Visit  Medication Sig Dispense Refill   acetaminophen  (TYLENOL ) 325 MG tablet Take 2 tablets (650 mg total) by mouth every 6 (six) hours as needed for mild pain (pain score 1-3) or fever (or Fever >/= 101).     aspirin  EC 81 MG tablet Take 81 mg by mouth daily.     atorvastatin (LIPITOR) 80 MG tablet Take 80 mg by mouth daily.     buPROPion  (WELLBUTRIN  XL) 150 MG 24 hr tablet TAKE 1 TABLET BY MOUTH DAILY 90 tablet 3   dapagliflozin  propanediol (FARXIGA ) 10 MG TABS tablet Take 1 tablet (10 mg total) by mouth daily. 90 tablet 1   divalproex  (DEPAKOTE  ER) 500 MG 24 hr tablet Take 1 tablet by mouth 2 (two) times daily.     donepezil  (ARICEPT ) 10 MG tablet Take 10 mg by mouth at bedtime.     DULoxetine  (CYMBALTA ) 30 MG capsule Take 1 capsule (30 mg total) by mouth daily. 30 capsule 0   fenofibrate  (TRICOR ) 145 MG tablet Take 1  tablet (145 mg total) by mouth daily. 90 tablet 3   gabapentin  (NEURONTIN ) 300 MG capsule Take 1 capsule (300 mg total) by mouth 3 (three) times daily. (Patient taking differently: Take 300 mg by mouth 3 (three) times daily. 3 times a week per sheet) 90 capsule 0   linagliptin  (TRADJENTA ) 5 MG TABS tablet Take 1 tablet (5 mg total) by mouth daily. 90 tablet 1   lisinopril -hydrochlorothiazide  (ZESTORETIC ) 20-25 MG tablet Take 1 tablet by mouth daily. 90 tablet 3   memantine  (NAMENDA ) 10 MG tablet Take 1 tablet by mouth daily.     metFORMIN  (GLUCOPHAGE -XR) 500 MG 24 hr tablet Take 1 tablet (500 mg total) by mouth daily with breakfast. 90 tablet 1   metoprolol  succinate (TOPROL -XL) 25 MG 24 hr tablet Take 1 tablet (25 mg total) by mouth daily. 90 tablet 3   omega-3 acid ethyl esters (LOVAZA ) 1 g capsule Take 2 capsules (2 g total) by mouth 2 (two) times daily. (Patient taking differently: Take 2 capsules by mouth daily.) 360 capsule 1   pantoprazole  (PROTONIX ) 40 MG tablet Take 40 mg by mouth daily.  pramipexole  (MIRAPEX ) 0.125 MG tablet TAKE 1 TABLET BY MOUTH EVERY NIGHT 2-3 HOURS BEFORE BEDTIME 90 tablet 1   primidone  (MYSOLINE ) 50 MG tablet Take 5 tablets (250 mg total) by mouth daily AND 3 tablets (150 mg total) at bedtime. 240 tablet 0   QUEtiapine  (SEROQUEL ) 50 MG tablet Take 1 tablet (50 mg total) by mouth at bedtime. 30 tablet 0   tamsulosin  (FLOMAX ) 0.4 MG CAPS capsule TAKE 2 CAPSULES BY MOUTH DAILY AFTER SUPPER 180 capsule 0   traZODone  (DESYREL ) 100 MG tablet Take 100 mg by mouth at bedtime.     Multiple Vitamins-Minerals (CENTRUM SILVER PO) Take 1 tablet by mouth daily.     No current facility-administered medications on file prior to visit.   "

## 2024-06-26 NOTE — Telephone Encounter (Signed)
 Faxed and placed into scan folder along with completed transmission log and charge sheet attached

## 2024-07-29 ENCOUNTER — Ambulatory Visit: Admitting: Nurse Practitioner

## 2024-09-24 ENCOUNTER — Ambulatory Visit: Admitting: Nurse Practitioner

## 2025-01-01 ENCOUNTER — Ambulatory Visit
